# Patient Record
Sex: Female | Born: 1945 | Race: White | Hispanic: No | Marital: Married | Smoking: Former smoker
Health system: Southern US, Community
[De-identification: ages and names within clinical notes are randomized; demographics above are authoritative.]

## PROBLEM LIST (undated history)

## (undated) DIAGNOSIS — R51 Headache: Secondary | ICD-10-CM

## (undated) DIAGNOSIS — R058 Other specified cough: Secondary | ICD-10-CM

## (undated) DIAGNOSIS — G8929 Other chronic pain: Secondary | ICD-10-CM

## (undated) DIAGNOSIS — F0390 Unspecified dementia without behavioral disturbance: Secondary | ICD-10-CM

## (undated) DIAGNOSIS — H9190 Unspecified hearing loss, unspecified ear: Secondary | ICD-10-CM

## (undated) DIAGNOSIS — N183 Chronic kidney disease, stage 3 unspecified: Secondary | ICD-10-CM

## (undated) DIAGNOSIS — D509 Iron deficiency anemia, unspecified: Secondary | ICD-10-CM

## (undated) DIAGNOSIS — Z9289 Personal history of other medical treatment: Secondary | ICD-10-CM

## (undated) DIAGNOSIS — I440 Atrioventricular block, first degree: Secondary | ICD-10-CM

## (undated) DIAGNOSIS — I739 Peripheral vascular disease, unspecified: Secondary | ICD-10-CM

## (undated) DIAGNOSIS — R05 Cough: Secondary | ICD-10-CM

## (undated) DIAGNOSIS — K5732 Diverticulitis of large intestine without perforation or abscess without bleeding: Secondary | ICD-10-CM

## (undated) DIAGNOSIS — E079 Disorder of thyroid, unspecified: Secondary | ICD-10-CM

## (undated) DIAGNOSIS — I10 Essential (primary) hypertension: Secondary | ICD-10-CM

## (undated) DIAGNOSIS — T464X5A Adverse effect of angiotensin-converting-enzyme inhibitors, initial encounter: Secondary | ICD-10-CM

## (undated) DIAGNOSIS — I050 Rheumatic mitral stenosis: Secondary | ICD-10-CM

## (undated) DIAGNOSIS — M1990 Unspecified osteoarthritis, unspecified site: Secondary | ICD-10-CM

## (undated) DIAGNOSIS — R112 Nausea with vomiting, unspecified: Secondary | ICD-10-CM

## (undated) DIAGNOSIS — J449 Chronic obstructive pulmonary disease, unspecified: Secondary | ICD-10-CM

## (undated) DIAGNOSIS — E7800 Pure hypercholesterolemia, unspecified: Secondary | ICD-10-CM

## (undated) DIAGNOSIS — K219 Gastro-esophageal reflux disease without esophagitis: Secondary | ICD-10-CM

## (undated) DIAGNOSIS — I350 Nonrheumatic aortic (valve) stenosis: Secondary | ICD-10-CM

## (undated) DIAGNOSIS — D649 Anemia, unspecified: Secondary | ICD-10-CM

## (undated) DIAGNOSIS — Z789 Other specified health status: Secondary | ICD-10-CM

## (undated) DIAGNOSIS — I2510 Atherosclerotic heart disease of native coronary artery without angina pectoris: Secondary | ICD-10-CM

## (undated) DIAGNOSIS — J45909 Unspecified asthma, uncomplicated: Secondary | ICD-10-CM

## (undated) DIAGNOSIS — Z9889 Other specified postprocedural states: Secondary | ICD-10-CM

## (undated) DIAGNOSIS — E119 Type 2 diabetes mellitus without complications: Secondary | ICD-10-CM

## (undated) DIAGNOSIS — I5032 Chronic diastolic (congestive) heart failure: Secondary | ICD-10-CM

## (undated) DIAGNOSIS — G4733 Obstructive sleep apnea (adult) (pediatric): Secondary | ICD-10-CM

## (undated) DIAGNOSIS — R519 Headache, unspecified: Secondary | ICD-10-CM

## (undated) DIAGNOSIS — I441 Atrioventricular block, second degree: Secondary | ICD-10-CM

## (undated) DIAGNOSIS — I77819 Aortic ectasia, unspecified site: Secondary | ICD-10-CM

## (undated) DIAGNOSIS — F419 Anxiety disorder, unspecified: Secondary | ICD-10-CM

## (undated) DIAGNOSIS — Z952 Presence of prosthetic heart valve: Secondary | ICD-10-CM

## (undated) HISTORY — DX: Gastro-esophageal reflux disease without esophagitis: K21.9

## (undated) HISTORY — DX: Diverticulitis of large intestine without perforation or abscess without bleeding: K57.32

## (undated) HISTORY — DX: Unspecified hearing loss, unspecified ear: H91.90

## (undated) HISTORY — PX: BARTHOLIN GLAND CYST EXCISION: SHX565

## (undated) HISTORY — DX: Atrioventricular block, first degree: I44.0

## (undated) HISTORY — DX: Atrioventricular block, second degree: I44.1

## (undated) HISTORY — PX: ABDOMINAL HYSTERECTOMY: SHX81

## (undated) HISTORY — PX: COLECTOMY: SHX59

## (undated) HISTORY — PX: TUBAL LIGATION: SHX77

## (undated) HISTORY — DX: Other specified cough: R05.8

## (undated) HISTORY — DX: Atherosclerotic heart disease of native coronary artery without angina pectoris: I25.10

## (undated) HISTORY — DX: Headache: R51

## (undated) HISTORY — DX: Chronic diastolic (congestive) heart failure: I50.32

## (undated) HISTORY — DX: Other specified health status: Z78.9

## (undated) HISTORY — DX: Essential (primary) hypertension: I10

## (undated) HISTORY — PX: TONSILLECTOMY: SUR1361

## (undated) HISTORY — PX: CATARACT EXTRACTION W/ INTRAOCULAR LENS  IMPLANT, BILATERAL: SHX1307

## (undated) HISTORY — PX: THYROIDECTOMY: SHX17

## (undated) HISTORY — DX: Other chronic pain: G89.29

## (undated) HISTORY — DX: Chronic obstructive pulmonary disease, unspecified: J44.9

## (undated) HISTORY — DX: Presence of prosthetic heart valve: Z95.2

## (undated) HISTORY — DX: Personal history of other medical treatment: Z92.89

## (undated) HISTORY — DX: Peripheral vascular disease, unspecified: I73.9

## (undated) HISTORY — PX: CARDIAC CATHETERIZATION: SHX172

## (undated) HISTORY — DX: Adverse effect of angiotensin-converting-enzyme inhibitors, initial encounter: T46.4X5A

## (undated) HISTORY — DX: Aortic ectasia, unspecified site: I77.819

## (undated) HISTORY — DX: Pure hypercholesterolemia, unspecified: E78.00

## (undated) HISTORY — PX: ABDOMINAL HYSTERECTOMY W/ PARTIAL VAGINACTOMY: SUR660

## (undated) HISTORY — DX: Cough: R05

## (undated) HISTORY — DX: Headache, unspecified: R51.9

## (undated) HISTORY — PX: BLADDER SUSPENSION: SHX72

---
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
## 1986-06-04 HISTORY — PX: BREAST CYST EXCISION: SHX579

## 1999-06-05 HISTORY — PX: CHOLECYSTECTOMY: SHX55

## 2000-09-11 HISTORY — PX: BREAST BIOPSY: SHX20

## 2001-08-02 LAB — FECAL OCCULT BLOOD, GUAIAC: Fecal Occult Blood: NEGATIVE

## 2004-04-13 ENCOUNTER — Ambulatory Visit: Payer: Self-pay | Admitting: General Surgery

## 2004-04-24 ENCOUNTER — Ambulatory Visit: Payer: Self-pay | Admitting: Internal Medicine

## 2004-04-26 ENCOUNTER — Ambulatory Visit: Payer: Self-pay | Admitting: General Surgery

## 2004-08-21 ENCOUNTER — Ambulatory Visit: Payer: Self-pay | Admitting: Internal Medicine

## 2004-10-23 ENCOUNTER — Ambulatory Visit: Payer: Self-pay | Admitting: Internal Medicine

## 2004-10-31 ENCOUNTER — Ambulatory Visit: Payer: Self-pay

## 2004-11-07 ENCOUNTER — Ambulatory Visit: Payer: Self-pay | Admitting: Internal Medicine

## 2004-11-15 ENCOUNTER — Ambulatory Visit: Payer: Self-pay | Admitting: Internal Medicine

## 2005-02-12 ENCOUNTER — Ambulatory Visit: Payer: Self-pay | Admitting: Internal Medicine

## 2005-03-09 ENCOUNTER — Ambulatory Visit: Payer: Self-pay | Admitting: Internal Medicine

## 2005-04-19 ENCOUNTER — Ambulatory Visit: Payer: Self-pay | Admitting: General Surgery

## 2005-05-03 ENCOUNTER — Ambulatory Visit: Payer: Self-pay | Admitting: Internal Medicine

## 2005-05-24 ENCOUNTER — Ambulatory Visit: Payer: Self-pay | Admitting: Internal Medicine

## 2005-06-28 ENCOUNTER — Ambulatory Visit: Payer: Self-pay | Admitting: General Surgery

## 2005-06-28 LAB — HM COLONOSCOPY: HM Colonoscopy: NORMAL

## 2005-07-25 ENCOUNTER — Ambulatory Visit: Payer: Self-pay | Admitting: Internal Medicine

## 2005-08-23 ENCOUNTER — Ambulatory Visit: Payer: Self-pay | Admitting: Internal Medicine

## 2005-11-22 ENCOUNTER — Ambulatory Visit: Payer: Self-pay | Admitting: Internal Medicine

## 2005-11-27 ENCOUNTER — Ambulatory Visit: Payer: Self-pay | Admitting: Internal Medicine

## 2005-11-27 ENCOUNTER — Ambulatory Visit (HOSPITAL_COMMUNITY): Admission: RE | Admit: 2005-11-27 | Discharge: 2005-11-27 | Payer: Self-pay | Admitting: Internal Medicine

## 2005-11-30 ENCOUNTER — Ambulatory Visit: Payer: Self-pay | Admitting: Internal Medicine

## 2006-01-16 ENCOUNTER — Ambulatory Visit: Payer: Self-pay | Admitting: Internal Medicine

## 2006-03-28 ENCOUNTER — Ambulatory Visit: Payer: Self-pay | Admitting: Internal Medicine

## 2006-05-07 ENCOUNTER — Ambulatory Visit: Payer: Self-pay | Admitting: General Surgery

## 2006-06-27 ENCOUNTER — Ambulatory Visit: Payer: Self-pay | Admitting: Internal Medicine

## 2006-07-19 ENCOUNTER — Ambulatory Visit: Payer: Self-pay | Admitting: Internal Medicine

## 2006-09-09 DIAGNOSIS — F3289 Other specified depressive episodes: Secondary | ICD-10-CM | POA: Insufficient documentation

## 2006-09-09 DIAGNOSIS — Z794 Long term (current) use of insulin: Secondary | ICD-10-CM

## 2006-09-09 DIAGNOSIS — F329 Major depressive disorder, single episode, unspecified: Secondary | ICD-10-CM | POA: Insufficient documentation

## 2006-09-09 DIAGNOSIS — E119 Type 2 diabetes mellitus without complications: Secondary | ICD-10-CM | POA: Insufficient documentation

## 2006-09-09 DIAGNOSIS — K5730 Diverticulosis of large intestine without perforation or abscess without bleeding: Secondary | ICD-10-CM | POA: Insufficient documentation

## 2006-09-09 DIAGNOSIS — J309 Allergic rhinitis, unspecified: Secondary | ICD-10-CM | POA: Insufficient documentation

## 2006-09-30 ENCOUNTER — Telehealth (INDEPENDENT_AMBULATORY_CARE_PROVIDER_SITE_OTHER): Payer: Self-pay | Admitting: *Deleted

## 2006-09-30 ENCOUNTER — Ambulatory Visit: Payer: Self-pay | Admitting: Internal Medicine

## 2006-10-01 ENCOUNTER — Encounter (INDEPENDENT_AMBULATORY_CARE_PROVIDER_SITE_OTHER): Payer: Self-pay | Admitting: *Deleted

## 2006-10-01 LAB — CONVERTED CEMR LAB
CO2: 30 meq/L (ref 19–32)
Chloride: 109 meq/L (ref 96–112)
Cholesterol: 230 mg/dL (ref 0–200)
Creatinine, Ser: 0.7 mg/dL (ref 0.4–1.2)
Glucose, Bld: 108 mg/dL — ABNORMAL HIGH (ref 70–99)
HDL: 27.2 mg/dL — ABNORMAL LOW (ref >39.0)
Phosphorus: 3.4 mg/dL (ref 2.3–4.6)
Sodium: 142 meq/L (ref 135–145)
Triglycerides: 284 mg/dL (ref 0–149)

## 2006-10-02 ENCOUNTER — Encounter: Payer: Self-pay | Admitting: Internal Medicine

## 2006-11-28 ENCOUNTER — Ambulatory Visit: Payer: Self-pay | Admitting: Internal Medicine

## 2007-01-02 ENCOUNTER — Ambulatory Visit: Payer: Self-pay | Admitting: Internal Medicine

## 2007-01-02 LAB — CONVERTED CEMR LAB: Hgb A1c MFr Bld: 6.2 % — ABNORMAL HIGH (ref 4.6–6.0)

## 2007-01-28 ENCOUNTER — Ambulatory Visit: Payer: Self-pay | Admitting: Family Medicine

## 2007-01-29 ENCOUNTER — Telehealth: Payer: Self-pay | Admitting: Family Medicine

## 2007-04-09 ENCOUNTER — Ambulatory Visit: Payer: Self-pay | Admitting: Internal Medicine

## 2007-04-09 LAB — CONVERTED CEMR LAB: Blood Glucose, Fingerstick: 131

## 2007-04-24 ENCOUNTER — Ambulatory Visit: Payer: Self-pay | Admitting: Internal Medicine

## 2007-04-25 LAB — CONVERTED CEMR LAB
Albumin: 3.3 g/dL — ABNORMAL LOW (ref 3.5–5.2)
BUN: 9 mg/dL (ref 6–23)
Calcium: 9.2 mg/dL (ref 8.4–10.5)
GFR calc Af Amer: 130 mL/min
GFR calc non Af Amer: 108 mL/min
Glucose, Bld: 110 mg/dL — ABNORMAL HIGH (ref 70–99)
Hgb A1c MFr Bld: 6.3 % — ABNORMAL HIGH (ref 4.6–6.0)
Potassium: 4.3 meq/L (ref 3.5–5.1)

## 2007-05-08 ENCOUNTER — Ambulatory Visit: Payer: Self-pay | Admitting: General Surgery

## 2007-06-16 ENCOUNTER — Telehealth (INDEPENDENT_AMBULATORY_CARE_PROVIDER_SITE_OTHER): Payer: Self-pay | Admitting: *Deleted

## 2007-07-27 ENCOUNTER — Emergency Department: Payer: Self-pay | Admitting: Emergency Medicine

## 2007-07-31 ENCOUNTER — Telehealth: Payer: Self-pay | Admitting: Internal Medicine

## 2007-07-31 ENCOUNTER — Emergency Department: Payer: Self-pay | Admitting: Emergency Medicine

## 2007-08-05 ENCOUNTER — Inpatient Hospital Stay: Payer: Self-pay | Admitting: Internal Medicine

## 2007-08-05 ENCOUNTER — Other Ambulatory Visit: Payer: Self-pay

## 2007-08-06 ENCOUNTER — Telehealth: Payer: Self-pay | Admitting: Internal Medicine

## 2007-08-08 ENCOUNTER — Encounter: Payer: Self-pay | Admitting: Internal Medicine

## 2007-08-22 ENCOUNTER — Ambulatory Visit: Payer: Self-pay | Admitting: Internal Medicine

## 2007-08-26 LAB — CONVERTED CEMR LAB
Albumin: 3.2 g/dL — ABNORMAL LOW (ref 3.5–5.2)
BUN: 11 mg/dL (ref 6–23)
Basophils Relative: 0.8 % (ref 0.0–1.0)
Calcium: 9.7 mg/dL (ref 8.4–10.5)
Cholesterol: 236 mg/dL (ref 0–200)
Direct LDL: 155.3 mg/dL
GFR calc Af Amer: 109 mL/min
Hemoglobin: 12 g/dL (ref 12.0–15.0)
Lymphocytes Relative: 27.3 % (ref 12.0–46.0)
MCV: 83.4 fL (ref 78.0–100.0)
Monocytes Absolute: 0.6 10*3/uL (ref 0.2–0.7)
Monocytes Relative: 7.7 % (ref 3.0–11.0)
Neutro Abs: 4.9 10*3/uL (ref 1.4–7.7)
Potassium: 4.2 meq/L (ref 3.5–5.1)
TSH: 1.19 microintl units/mL (ref 0.35–5.50)
Total Bilirubin: 0.5 mg/dL (ref 0.3–1.2)
Total Protein: 7.3 g/dL (ref 6.0–8.3)
VLDL: 53 mg/dL — ABNORMAL HIGH (ref 0–40)

## 2007-08-27 ENCOUNTER — Encounter: Payer: Self-pay | Admitting: Internal Medicine

## 2007-09-01 ENCOUNTER — Ambulatory Visit: Payer: Self-pay | Admitting: Internal Medicine

## 2007-09-01 ENCOUNTER — Telehealth: Payer: Self-pay | Admitting: Internal Medicine

## 2007-09-02 ENCOUNTER — Encounter: Payer: Self-pay | Admitting: Internal Medicine

## 2007-10-09 ENCOUNTER — Ambulatory Visit: Payer: Self-pay | Admitting: Internal Medicine

## 2007-10-28 ENCOUNTER — Telehealth: Payer: Self-pay | Admitting: Family Medicine

## 2007-10-28 ENCOUNTER — Ambulatory Visit: Payer: Self-pay | Admitting: Internal Medicine

## 2007-10-28 ENCOUNTER — Observation Stay: Payer: Self-pay | Admitting: Internal Medicine

## 2007-10-29 ENCOUNTER — Encounter: Payer: Self-pay | Admitting: Internal Medicine

## 2007-10-29 ENCOUNTER — Inpatient Hospital Stay (HOSPITAL_COMMUNITY): Admission: AD | Admit: 2007-10-29 | Discharge: 2007-10-30 | Payer: Self-pay | Admitting: Cardiology

## 2007-10-29 ENCOUNTER — Ambulatory Visit: Payer: Self-pay | Admitting: Internal Medicine

## 2007-10-29 HISTORY — PX: CORONARY ANGIOPLASTY: SHX604

## 2007-10-30 ENCOUNTER — Telehealth: Payer: Self-pay | Admitting: Internal Medicine

## 2007-10-30 ENCOUNTER — Encounter: Payer: Self-pay | Admitting: Internal Medicine

## 2007-10-31 ENCOUNTER — Telehealth (INDEPENDENT_AMBULATORY_CARE_PROVIDER_SITE_OTHER): Payer: Self-pay | Admitting: *Deleted

## 2007-11-11 ENCOUNTER — Ambulatory Visit: Payer: Self-pay | Admitting: Internal Medicine

## 2007-11-13 LAB — CONVERTED CEMR LAB
AST: 15 units/L (ref 0–37)
Albumin: 3.2 g/dL — ABNORMAL LOW (ref 3.5–5.2)
Basophils Absolute: 0.1 10*3/uL (ref 0.0–0.1)
Basophils Relative: 0.8 % (ref 0.0–1.0)
CO2: 29 meq/L (ref 19–32)
Calcium: 9.3 mg/dL (ref 8.4–10.5)
Chloride: 103 meq/L (ref 96–112)
Eosinophils Absolute: 0.2 10*3/uL (ref 0.0–0.7)
GFR calc Af Amer: 109 mL/min
GFR calc non Af Amer: 90 mL/min
Hemoglobin: 11.7 g/dL — ABNORMAL LOW (ref 12.0–15.0)
Hgb A1c MFr Bld: 6.2 % — ABNORMAL HIGH (ref 4.6–6.0)
MCHC: 34.3 g/dL (ref 30.0–36.0)
MCV: 83.8 fL (ref 78.0–100.0)
Neutro Abs: 5.2 10*3/uL (ref 1.4–7.7)
RBC: 4.07 M/uL (ref 3.87–5.11)
Sodium: 140 meq/L (ref 135–145)

## 2007-11-19 ENCOUNTER — Ambulatory Visit: Payer: Self-pay | Admitting: Internal Medicine

## 2007-12-02 ENCOUNTER — Telehealth (INDEPENDENT_AMBULATORY_CARE_PROVIDER_SITE_OTHER): Payer: Self-pay | Admitting: *Deleted

## 2007-12-08 ENCOUNTER — Telehealth (INDEPENDENT_AMBULATORY_CARE_PROVIDER_SITE_OTHER): Payer: Self-pay | Admitting: *Deleted

## 2007-12-09 ENCOUNTER — Encounter: Payer: Self-pay | Admitting: Internal Medicine

## 2007-12-09 ENCOUNTER — Ambulatory Visit: Payer: Self-pay | Admitting: Pulmonary Disease

## 2007-12-09 ENCOUNTER — Ambulatory Visit (HOSPITAL_BASED_OUTPATIENT_CLINIC_OR_DEPARTMENT_OTHER): Admission: RE | Admit: 2007-12-09 | Discharge: 2007-12-09 | Payer: Self-pay | Admitting: Pulmonary Disease

## 2007-12-12 ENCOUNTER — Ambulatory Visit: Payer: Self-pay | Admitting: Family Medicine

## 2007-12-16 ENCOUNTER — Telehealth: Payer: Self-pay | Admitting: Family Medicine

## 2007-12-17 ENCOUNTER — Telehealth (INDEPENDENT_AMBULATORY_CARE_PROVIDER_SITE_OTHER): Payer: Self-pay | Admitting: *Deleted

## 2007-12-25 ENCOUNTER — Telehealth (INDEPENDENT_AMBULATORY_CARE_PROVIDER_SITE_OTHER): Payer: Self-pay | Admitting: *Deleted

## 2007-12-25 ENCOUNTER — Ambulatory Visit: Payer: Self-pay | Admitting: Pulmonary Disease

## 2007-12-25 DIAGNOSIS — G4733 Obstructive sleep apnea (adult) (pediatric): Secondary | ICD-10-CM | POA: Insufficient documentation

## 2007-12-26 ENCOUNTER — Telehealth: Payer: Self-pay | Admitting: Pulmonary Disease

## 2007-12-29 ENCOUNTER — Ambulatory Visit: Payer: Self-pay | Admitting: Internal Medicine

## 2007-12-29 ENCOUNTER — Telehealth: Payer: Self-pay | Admitting: Internal Medicine

## 2007-12-30 ENCOUNTER — Inpatient Hospital Stay: Payer: Self-pay | Admitting: Internal Medicine

## 2007-12-30 ENCOUNTER — Ambulatory Visit: Payer: Self-pay | Admitting: Internal Medicine

## 2007-12-30 ENCOUNTER — Inpatient Hospital Stay (HOSPITAL_COMMUNITY): Admission: AD | Admit: 2007-12-30 | Discharge: 2008-01-01 | Payer: Self-pay | Admitting: Cardiology

## 2007-12-30 ENCOUNTER — Ambulatory Visit: Payer: Self-pay | Admitting: Cardiology

## 2008-01-13 ENCOUNTER — Encounter: Payer: Self-pay | Admitting: Pulmonary Disease

## 2008-01-21 ENCOUNTER — Ambulatory Visit: Payer: Self-pay | Admitting: Internal Medicine

## 2008-01-28 ENCOUNTER — Ambulatory Visit: Payer: Self-pay | Admitting: Internal Medicine

## 2008-01-28 LAB — CONVERTED CEMR LAB
AST: 13 units/L (ref 0–37)
Albumin: 4 g/dL (ref 3.5–5.2)
Alkaline Phosphatase: 97 units/L (ref 39–117)
BUN: 15 mg/dL (ref 6–23)
HDL: 36 mg/dL — ABNORMAL LOW (ref >39)
LDL Cholesterol: 102 mg/dL — ABNORMAL HIGH (ref 0–99)
Potassium: 4.6 meq/L (ref 3.5–5.3)
Total Bilirubin: 0.3 mg/dL (ref 0.3–1.2)
Total CHOL/HDL Ratio: 5.3
VLDL: 51 mg/dL — ABNORMAL HIGH (ref 0–40)

## 2008-02-17 ENCOUNTER — Ambulatory Visit: Payer: Self-pay | Admitting: Internal Medicine

## 2008-03-08 ENCOUNTER — Ambulatory Visit: Payer: Self-pay | Admitting: Cardiovascular Disease

## 2008-03-10 ENCOUNTER — Ambulatory Visit: Payer: Self-pay | Admitting: Internal Medicine

## 2008-03-11 ENCOUNTER — Telehealth: Payer: Self-pay | Admitting: Internal Medicine

## 2008-03-11 LAB — CONVERTED CEMR LAB
ALT: 16 units/L (ref 0–35)
BUN: 14 mg/dL (ref 6–23)
CO2: 31 meq/L (ref 19–32)
Chloride: 106 meq/L (ref 96–112)
Cholesterol: 166 mg/dL (ref 0–200)
GFR calc non Af Amer: 90 mL/min
Glucose, Bld: 113 mg/dL — ABNORMAL HIGH (ref 70–99)
Hgb A1c MFr Bld: 6.4 % — ABNORMAL HIGH (ref 4.6–6.0)
Potassium: 4.2 meq/L (ref 3.5–5.1)
Sodium: 143 meq/L (ref 135–145)

## 2008-03-12 ENCOUNTER — Encounter: Payer: Self-pay | Admitting: Internal Medicine

## 2008-03-16 ENCOUNTER — Ambulatory Visit: Payer: Self-pay

## 2008-03-17 ENCOUNTER — Telehealth: Payer: Self-pay | Admitting: Internal Medicine

## 2008-03-22 ENCOUNTER — Ambulatory Visit: Payer: Self-pay | Admitting: Internal Medicine

## 2008-03-22 ENCOUNTER — Emergency Department: Payer: Self-pay | Admitting: Emergency Medicine

## 2008-03-23 ENCOUNTER — Encounter: Payer: Self-pay | Admitting: Internal Medicine

## 2008-03-23 ENCOUNTER — Ambulatory Visit: Payer: Self-pay | Admitting: Emergency Medicine

## 2008-06-09 ENCOUNTER — Telehealth (INDEPENDENT_AMBULATORY_CARE_PROVIDER_SITE_OTHER): Payer: Self-pay | Admitting: *Deleted

## 2008-06-17 ENCOUNTER — Ambulatory Visit: Payer: Self-pay | Admitting: Family Medicine

## 2008-06-23 ENCOUNTER — Ambulatory Visit: Payer: Self-pay | Admitting: Internal Medicine

## 2008-06-23 DIAGNOSIS — R413 Other amnesia: Secondary | ICD-10-CM | POA: Insufficient documentation

## 2008-06-25 ENCOUNTER — Encounter: Payer: Self-pay | Admitting: Internal Medicine

## 2008-06-25 ENCOUNTER — Telehealth: Payer: Self-pay | Admitting: Internal Medicine

## 2008-07-15 ENCOUNTER — Ambulatory Visit: Payer: Self-pay | Admitting: Internal Medicine

## 2008-07-27 ENCOUNTER — Encounter: Payer: Self-pay | Admitting: Internal Medicine

## 2008-07-27 ENCOUNTER — Ambulatory Visit: Payer: Self-pay

## 2008-08-02 LAB — HM DIABETES EYE EXAM: HM Diabetic Eye Exam: NORMAL

## 2008-08-04 ENCOUNTER — Telehealth: Payer: Self-pay | Admitting: Internal Medicine

## 2008-08-06 ENCOUNTER — Telehealth: Payer: Self-pay | Admitting: Internal Medicine

## 2008-08-16 ENCOUNTER — Telehealth: Payer: Self-pay | Admitting: Family Medicine

## 2008-10-26 ENCOUNTER — Ambulatory Visit: Payer: Self-pay | Admitting: Family Medicine

## 2008-10-26 DIAGNOSIS — M722 Plantar fascial fibromatosis: Secondary | ICD-10-CM | POA: Insufficient documentation

## 2008-10-27 ENCOUNTER — Ambulatory Visit: Payer: Self-pay | Admitting: Family Medicine

## 2008-11-10 ENCOUNTER — Telehealth: Payer: Self-pay | Admitting: Internal Medicine

## 2008-11-29 ENCOUNTER — Telehealth: Payer: Self-pay | Admitting: Family Medicine

## 2008-12-10 ENCOUNTER — Telehealth: Payer: Self-pay | Admitting: Internal Medicine

## 2008-12-24 ENCOUNTER — Ambulatory Visit: Payer: Self-pay | Admitting: Internal Medicine

## 2008-12-27 LAB — CONVERTED CEMR LAB
BUN: 12 mg/dL (ref 6–23)
Basophils Absolute: 0 10*3/uL (ref 0.0–0.1)
Bilirubin, Direct: 0 mg/dL (ref 0.0–0.3)
CO2: 30 meq/L (ref 19–32)
Calcium: 9.1 mg/dL (ref 8.4–10.5)
Chloride: 106 meq/L (ref 96–112)
Creatinine,U: 115 mg/dL
HCT: 35.8 % — ABNORMAL LOW (ref 36.0–46.0)
HDL: 28.9 mg/dL — ABNORMAL LOW (ref >39.00)
Hemoglobin: 11.7 g/dL — ABNORMAL LOW (ref 12.0–15.0)
Lymphs Abs: 1.3 10*3/uL (ref 0.7–4.0)
MCV: 78.3 fL (ref 78.0–100.0)
Monocytes Absolute: 0.4 10*3/uL (ref 0.1–1.0)
Monocytes Relative: 7 % (ref 3.0–12.0)
Neutro Abs: 4.5 10*3/uL (ref 1.4–7.7)
Phosphorus: 4.2 mg/dL (ref 2.3–4.6)
Platelets: 175 10*3/uL (ref 150.0–400.0)
Potassium: 4.5 meq/L (ref 3.5–5.1)
RDW: 14.1 % (ref 11.5–14.6)
Sodium: 141 meq/L (ref 135–145)
Total Bilirubin: 0.7 mg/dL (ref 0.3–1.2)
Total CHOL/HDL Ratio: 9
Triglycerides: 223 mg/dL — ABNORMAL HIGH (ref 0.0–149.0)
VLDL: 44.6 mg/dL — ABNORMAL HIGH (ref 0.0–40.0)

## 2009-01-06 ENCOUNTER — Ambulatory Visit: Payer: Self-pay | Admitting: Internal Medicine

## 2009-01-06 DIAGNOSIS — I10 Essential (primary) hypertension: Secondary | ICD-10-CM | POA: Insufficient documentation

## 2009-01-07 ENCOUNTER — Encounter: Payer: Self-pay | Admitting: Internal Medicine

## 2009-01-12 ENCOUNTER — Telehealth: Payer: Self-pay | Admitting: Internal Medicine

## 2009-01-18 ENCOUNTER — Telehealth (INDEPENDENT_AMBULATORY_CARE_PROVIDER_SITE_OTHER): Payer: Self-pay | Admitting: *Deleted

## 2009-01-19 ENCOUNTER — Ambulatory Visit: Payer: Self-pay

## 2009-01-19 ENCOUNTER — Encounter: Payer: Self-pay | Admitting: Internal Medicine

## 2009-01-20 ENCOUNTER — Telehealth (INDEPENDENT_AMBULATORY_CARE_PROVIDER_SITE_OTHER): Payer: Self-pay | Admitting: *Deleted

## 2009-01-20 ENCOUNTER — Telehealth: Payer: Self-pay | Admitting: Internal Medicine

## 2009-01-28 ENCOUNTER — Ambulatory Visit: Payer: Self-pay | Admitting: Pulmonary Disease

## 2009-01-28 DIAGNOSIS — J45909 Unspecified asthma, uncomplicated: Secondary | ICD-10-CM | POA: Insufficient documentation

## 2009-02-14 ENCOUNTER — Telehealth: Payer: Self-pay | Admitting: Internal Medicine

## 2009-02-15 ENCOUNTER — Telehealth: Payer: Self-pay | Admitting: Internal Medicine

## 2009-02-18 ENCOUNTER — Telehealth: Payer: Self-pay | Admitting: Internal Medicine

## 2009-02-22 ENCOUNTER — Encounter: Admission: RE | Admit: 2009-02-22 | Discharge: 2009-02-22 | Payer: Self-pay | Admitting: Family Medicine

## 2009-02-22 ENCOUNTER — Telehealth (INDEPENDENT_AMBULATORY_CARE_PROVIDER_SITE_OTHER): Payer: Self-pay | Admitting: *Deleted

## 2009-02-22 ENCOUNTER — Ambulatory Visit: Payer: Self-pay | Admitting: Family Medicine

## 2009-03-01 ENCOUNTER — Ambulatory Visit: Payer: Self-pay | Admitting: Cardiology

## 2009-03-02 ENCOUNTER — Ambulatory Visit: Payer: Self-pay | Admitting: Family Medicine

## 2009-03-02 DIAGNOSIS — E049 Nontoxic goiter, unspecified: Secondary | ICD-10-CM | POA: Insufficient documentation

## 2009-03-03 ENCOUNTER — Encounter: Admission: RE | Admit: 2009-03-03 | Discharge: 2009-03-03 | Payer: Self-pay | Admitting: Family Medicine

## 2009-03-04 ENCOUNTER — Encounter: Payer: Self-pay | Admitting: Family Medicine

## 2009-03-07 LAB — CONVERTED CEMR LAB: Free T4: 0.6 ng/dL (ref 0.6–1.6)

## 2009-03-11 ENCOUNTER — Encounter: Payer: Self-pay | Admitting: Internal Medicine

## 2009-03-15 ENCOUNTER — Telehealth: Payer: Self-pay | Admitting: Internal Medicine

## 2009-03-21 ENCOUNTER — Encounter: Payer: Self-pay | Admitting: Family Medicine

## 2009-03-21 ENCOUNTER — Encounter (INDEPENDENT_AMBULATORY_CARE_PROVIDER_SITE_OTHER): Payer: Self-pay | Admitting: Interventional Radiology

## 2009-03-21 ENCOUNTER — Ambulatory Visit (HOSPITAL_COMMUNITY): Admission: RE | Admit: 2009-03-21 | Discharge: 2009-03-21 | Payer: Self-pay | Admitting: General Surgery

## 2009-03-28 ENCOUNTER — Ambulatory Visit: Payer: Self-pay | Admitting: Family Medicine

## 2009-03-28 DIAGNOSIS — K1120 Sialoadenitis, unspecified: Secondary | ICD-10-CM

## 2009-04-04 ENCOUNTER — Encounter (INDEPENDENT_AMBULATORY_CARE_PROVIDER_SITE_OTHER): Payer: Self-pay | Admitting: *Deleted

## 2009-04-05 ENCOUNTER — Encounter: Payer: Self-pay | Admitting: Family Medicine

## 2009-04-06 ENCOUNTER — Telehealth: Payer: Self-pay | Admitting: Internal Medicine

## 2009-04-08 ENCOUNTER — Ambulatory Visit: Payer: Self-pay | Admitting: Family Medicine

## 2009-04-08 ENCOUNTER — Encounter: Payer: Self-pay | Admitting: Family Medicine

## 2009-04-19 ENCOUNTER — Telehealth: Payer: Self-pay | Admitting: Family Medicine

## 2009-04-20 ENCOUNTER — Ambulatory Visit: Payer: Self-pay | Admitting: Family Medicine

## 2009-04-20 LAB — CONVERTED CEMR LAB: Hgb A1c MFr Bld: 7.5 % — ABNORMAL HIGH (ref 4.6–6.5)

## 2009-04-30 ENCOUNTER — Encounter: Payer: Self-pay | Admitting: Family Medicine

## 2009-05-06 ENCOUNTER — Telehealth (INDEPENDENT_AMBULATORY_CARE_PROVIDER_SITE_OTHER): Payer: Self-pay | Admitting: Internal Medicine

## 2009-05-07 ENCOUNTER — Ambulatory Visit: Payer: Self-pay | Admitting: Family Medicine

## 2009-05-07 DIAGNOSIS — R3129 Other microscopic hematuria: Secondary | ICD-10-CM

## 2009-05-07 LAB — CONVERTED CEMR LAB
Blood in Urine, dipstick: NEGATIVE
Glucose, Urine, Semiquant: 500
Ketones, urine, test strip: NEGATIVE
Nitrite: NEGATIVE
Urobilinogen, UA: 0.2
pH: 5

## 2009-05-09 ENCOUNTER — Telehealth: Payer: Self-pay | Admitting: Family Medicine

## 2009-05-09 ENCOUNTER — Ambulatory Visit: Payer: Self-pay | Admitting: Family Medicine

## 2009-05-09 LAB — CONVERTED CEMR LAB
Blood Glucose, Fingerstick: 381
Calcium: 9.4 mg/dL (ref 8.4–10.5)
GFR calc non Af Amer: 76.59 mL/min (ref >60)
Potassium: 3.5 meq/L (ref 3.5–5.1)
Sodium: 137 meq/L (ref 135–145)

## 2009-05-17 ENCOUNTER — Telehealth: Payer: Self-pay | Admitting: Family Medicine

## 2009-05-18 ENCOUNTER — Ambulatory Visit: Payer: Self-pay | Admitting: Family Medicine

## 2009-05-18 DIAGNOSIS — R300 Dysuria: Secondary | ICD-10-CM

## 2009-05-18 LAB — CONVERTED CEMR LAB
Bilirubin Urine: NEGATIVE
Glucose, Urine, Semiquant: NEGATIVE
Specific Gravity, Urine: 1.015
pH: 6

## 2009-05-19 ENCOUNTER — Encounter: Payer: Self-pay | Admitting: Family Medicine

## 2009-05-19 LAB — CONVERTED CEMR LAB
Albumin: 3.5 g/dL (ref 3.5–5.2)
Calcium: 8.5 mg/dL (ref 8.4–10.5)
Phosphorus: 2.9 mg/dL (ref 2.3–4.6)
Sodium: 136 meq/L (ref 135–145)

## 2009-05-24 ENCOUNTER — Telehealth: Payer: Self-pay | Admitting: Family Medicine

## 2009-05-31 ENCOUNTER — Telehealth: Payer: Self-pay | Admitting: Family Medicine

## 2009-06-02 ENCOUNTER — Ambulatory Visit: Payer: Self-pay | Admitting: Family Medicine

## 2009-06-02 ENCOUNTER — Telehealth: Payer: Self-pay | Admitting: Family Medicine

## 2009-06-07 ENCOUNTER — Telehealth: Payer: Self-pay | Admitting: Family Medicine

## 2009-06-09 ENCOUNTER — Telehealth: Payer: Self-pay | Admitting: Family Medicine

## 2009-06-10 ENCOUNTER — Telehealth: Payer: Self-pay | Admitting: Family Medicine

## 2009-06-15 ENCOUNTER — Telehealth: Payer: Self-pay | Admitting: Internal Medicine

## 2009-06-22 ENCOUNTER — Telehealth: Payer: Self-pay | Admitting: Family Medicine

## 2009-06-24 ENCOUNTER — Ambulatory Visit: Payer: Self-pay | Admitting: Family Medicine

## 2009-06-24 ENCOUNTER — Encounter: Payer: Self-pay | Admitting: Family Medicine

## 2009-07-04 ENCOUNTER — Encounter (INDEPENDENT_AMBULATORY_CARE_PROVIDER_SITE_OTHER): Payer: Self-pay | Admitting: *Deleted

## 2009-07-13 ENCOUNTER — Telehealth: Payer: Self-pay | Admitting: Family Medicine

## 2009-08-10 ENCOUNTER — Ambulatory Visit: Payer: Self-pay | Admitting: Family Medicine

## 2009-08-10 DIAGNOSIS — E039 Hypothyroidism, unspecified: Secondary | ICD-10-CM

## 2009-08-11 LAB — CONVERTED CEMR LAB
Hgb A1c MFr Bld: 8.9 % — ABNORMAL HIGH (ref 4.6–6.5)
TSH: 0.81 microintl units/mL (ref 0.35–5.50)

## 2009-08-17 ENCOUNTER — Telehealth: Payer: Self-pay | Admitting: Family Medicine

## 2009-08-24 ENCOUNTER — Telehealth: Payer: Self-pay | Admitting: Family Medicine

## 2009-08-31 ENCOUNTER — Telehealth: Payer: Self-pay | Admitting: Family Medicine

## 2009-09-06 ENCOUNTER — Telehealth (INDEPENDENT_AMBULATORY_CARE_PROVIDER_SITE_OTHER): Payer: Self-pay | Admitting: *Deleted

## 2009-09-26 ENCOUNTER — Encounter: Payer: Self-pay | Admitting: Family Medicine

## 2009-09-29 ENCOUNTER — Ambulatory Visit: Payer: Self-pay | Admitting: Family Medicine

## 2009-09-29 DIAGNOSIS — M19049 Primary osteoarthritis, unspecified hand: Secondary | ICD-10-CM | POA: Insufficient documentation

## 2009-10-07 ENCOUNTER — Encounter: Payer: Self-pay | Admitting: Internal Medicine

## 2009-11-02 ENCOUNTER — Encounter: Payer: Self-pay | Admitting: Family Medicine

## 2009-11-09 ENCOUNTER — Telehealth: Payer: Self-pay | Admitting: Family Medicine

## 2009-11-11 ENCOUNTER — Ambulatory Visit: Payer: Self-pay | Admitting: Family Medicine

## 2009-11-11 LAB — CONVERTED CEMR LAB
Bilirubin Urine: NEGATIVE
Protein, U semiquant: NEGATIVE
Urobilinogen, UA: 0.2

## 2009-11-14 ENCOUNTER — Telehealth: Payer: Self-pay | Admitting: Internal Medicine

## 2009-11-14 ENCOUNTER — Ambulatory Visit: Payer: Self-pay | Admitting: Cardiovascular Disease

## 2009-12-27 ENCOUNTER — Telehealth: Payer: Self-pay | Admitting: Cardiovascular Disease

## 2010-01-02 ENCOUNTER — Ambulatory Visit: Payer: Medicare Other | Admitting: Otolaryngology

## 2010-01-18 ENCOUNTER — Encounter: Payer: Self-pay | Admitting: Family Medicine

## 2010-01-18 ENCOUNTER — Encounter: Payer: Self-pay | Admitting: Internal Medicine

## 2010-01-19 ENCOUNTER — Inpatient Hospital Stay: Payer: Medicare Other | Admitting: Otolaryngology

## 2010-01-20 LAB — PATHOLOGY REPORT

## 2010-01-21 ENCOUNTER — Encounter: Payer: Self-pay | Admitting: Family Medicine

## 2010-01-24 ENCOUNTER — Encounter: Payer: Self-pay | Admitting: Cardiovascular Disease

## 2010-01-24 ENCOUNTER — Encounter: Payer: Self-pay | Admitting: Family Medicine

## 2010-01-24 ENCOUNTER — Emergency Department: Payer: Medicare Other | Admitting: Emergency Medicine

## 2010-01-25 ENCOUNTER — Ambulatory Visit: Payer: Self-pay | Admitting: Family Medicine

## 2010-01-25 DIAGNOSIS — J449 Chronic obstructive pulmonary disease, unspecified: Secondary | ICD-10-CM

## 2010-01-25 DIAGNOSIS — I319 Disease of pericardium, unspecified: Secondary | ICD-10-CM | POA: Insufficient documentation

## 2010-01-26 ENCOUNTER — Telehealth: Payer: Self-pay | Admitting: Internal Medicine

## 2010-01-30 ENCOUNTER — Encounter (INDEPENDENT_AMBULATORY_CARE_PROVIDER_SITE_OTHER): Payer: Self-pay | Admitting: *Deleted

## 2010-02-02 ENCOUNTER — Ambulatory Visit (HOSPITAL_COMMUNITY): Admission: RE | Admit: 2010-02-02 | Discharge: 2010-02-02 | Payer: Self-pay | Admitting: Family Medicine

## 2010-02-02 ENCOUNTER — Encounter: Payer: Self-pay | Admitting: Family Medicine

## 2010-02-02 ENCOUNTER — Ambulatory Visit: Payer: Self-pay

## 2010-02-02 ENCOUNTER — Ambulatory Visit: Payer: Self-pay | Admitting: Cardiology

## 2010-02-02 ENCOUNTER — Encounter: Payer: Self-pay | Admitting: Internal Medicine

## 2010-02-03 ENCOUNTER — Ambulatory Visit: Payer: Self-pay | Admitting: Internal Medicine

## 2010-02-03 DIAGNOSIS — R5383 Other fatigue: Secondary | ICD-10-CM

## 2010-02-03 DIAGNOSIS — R5381 Other malaise: Secondary | ICD-10-CM

## 2010-02-07 ENCOUNTER — Telehealth: Payer: Self-pay | Admitting: Internal Medicine

## 2010-02-08 ENCOUNTER — Telehealth: Payer: Self-pay | Admitting: Internal Medicine

## 2010-02-09 ENCOUNTER — Telehealth: Payer: Self-pay | Admitting: Internal Medicine

## 2010-02-14 ENCOUNTER — Telehealth: Payer: Self-pay | Admitting: Family Medicine

## 2010-02-22 ENCOUNTER — Encounter: Payer: Self-pay | Admitting: Family Medicine

## 2010-02-24 ENCOUNTER — Encounter: Payer: Self-pay | Admitting: Family Medicine

## 2010-02-24 ENCOUNTER — Ambulatory Visit: Payer: Medicare Other | Admitting: Family Medicine

## 2010-03-03 ENCOUNTER — Ambulatory Visit: Payer: Self-pay | Admitting: Family Medicine

## 2010-03-16 ENCOUNTER — Ambulatory Visit: Payer: Self-pay | Admitting: Family Medicine

## 2010-03-22 LAB — CONVERTED CEMR LAB: TSH: 2.66 microintl units/mL (ref 0.35–5.50)

## 2010-04-14 ENCOUNTER — Telehealth: Payer: Self-pay | Admitting: Family Medicine

## 2010-04-17 ENCOUNTER — Telehealth: Payer: Self-pay | Admitting: Family Medicine

## 2010-04-17 ENCOUNTER — Ambulatory Visit: Payer: Self-pay | Admitting: Internal Medicine

## 2010-04-17 DIAGNOSIS — R05 Cough: Secondary | ICD-10-CM

## 2010-04-17 DIAGNOSIS — R252 Cramp and spasm: Secondary | ICD-10-CM

## 2010-04-17 LAB — CONVERTED CEMR LAB
BUN: 11 mg/dL (ref 6–23)
Chloride: 99 meq/L (ref 96–112)
Magnesium: 1.8 mg/dL (ref 1.5–2.5)
Potassium: 4.1 meq/L (ref 3.5–5.1)

## 2010-04-25 ENCOUNTER — Ambulatory Visit: Payer: Self-pay | Admitting: Family Medicine

## 2010-05-31 ENCOUNTER — Ambulatory Visit: Payer: Self-pay | Admitting: Family Medicine

## 2010-06-13 ENCOUNTER — Other Ambulatory Visit: Payer: Self-pay | Admitting: Family Medicine

## 2010-06-13 ENCOUNTER — Ambulatory Visit
Admission: RE | Admit: 2010-06-13 | Discharge: 2010-06-13 | Payer: Self-pay | Source: Home / Self Care | Attending: Family Medicine | Admitting: Family Medicine

## 2010-06-13 DIAGNOSIS — J019 Acute sinusitis, unspecified: Secondary | ICD-10-CM | POA: Insufficient documentation

## 2010-06-13 LAB — IGA: IgA: 202 mg/dL (ref 68–378)

## 2010-06-13 LAB — TSH: TSH: 4 u[IU]/mL (ref 0.35–5.50)

## 2010-06-14 ENCOUNTER — Telehealth (INDEPENDENT_AMBULATORY_CARE_PROVIDER_SITE_OTHER): Payer: Self-pay | Admitting: *Deleted

## 2010-06-15 ENCOUNTER — Telehealth: Payer: Self-pay | Admitting: Family Medicine

## 2010-06-16 ENCOUNTER — Other Ambulatory Visit: Payer: Self-pay | Admitting: Family Medicine

## 2010-06-16 ENCOUNTER — Ambulatory Visit
Admission: RE | Admit: 2010-06-16 | Discharge: 2010-06-16 | Payer: Self-pay | Source: Home / Self Care | Attending: Family Medicine | Admitting: Family Medicine

## 2010-06-16 LAB — HEMOGLOBIN A1C: Hgb A1c MFr Bld: 7.7 % — ABNORMAL HIGH (ref 4.6–6.5)

## 2010-06-19 ENCOUNTER — Telehealth: Payer: Self-pay | Admitting: Family Medicine

## 2010-06-20 ENCOUNTER — Ambulatory Visit: Admit: 2010-06-20 | Payer: Self-pay | Admitting: Family Medicine

## 2010-06-30 ENCOUNTER — Telehealth: Payer: Self-pay | Admitting: Family Medicine

## 2010-06-30 ENCOUNTER — Ambulatory Visit
Admission: RE | Admit: 2010-06-30 | Discharge: 2010-06-30 | Payer: Self-pay | Source: Home / Self Care | Attending: Family Medicine | Admitting: Family Medicine

## 2010-07-02 LAB — CONVERTED CEMR LAB
ALT: 15 units/L (ref 0–35)
BUN: 12 mg/dL (ref 6–23)
Bilirubin, Direct: 0.1 mg/dL (ref 0.0–0.3)
Chloride: 108 meq/L (ref 96–112)
Eosinophils Absolute: 0.1 10*3/uL (ref 0.0–0.7)
Eosinophils Relative: 1.9 % (ref 0.0–5.0)
GFR calc Af Amer: 130 mL/min
HCT: 35.5 % — ABNORMAL LOW (ref 36.0–46.0)
HDL: 31.1 mg/dL — ABNORMAL LOW (ref >39.0)
Hemoglobin: 11.8 g/dL — ABNORMAL LOW (ref 12.0–15.0)
Hgb A1c MFr Bld: 6.3 % — ABNORMAL HIGH (ref 4.6–6.0)
MCV: 82.4 fL (ref 78.0–100.0)
Monocytes Absolute: 0.6 10*3/uL (ref 0.1–1.0)
Neutro Abs: 4.6 10*3/uL (ref 1.4–7.7)
Phosphorus: 4 mg/dL (ref 2.3–4.6)
Platelets: 167 10*3/uL (ref 150–400)
Potassium: 4.3 meq/L (ref 3.5–5.1)
RDW: 13.2 % (ref 11.5–14.6)
Sodium: 141 meq/L (ref 135–145)
TSH: 2.38 microintl units/mL (ref 0.35–5.50)
Total Bilirubin: 0.7 mg/dL (ref 0.3–1.2)
Total Protein: 6.4 g/dL (ref 6.0–8.3)
Triglycerides: 134 mg/dL (ref 0–149)
Vitamin B-12: 366 pg/mL (ref 211–911)

## 2010-07-04 ENCOUNTER — Encounter: Payer: Self-pay | Admitting: Family Medicine

## 2010-07-04 ENCOUNTER — Ambulatory Visit: Payer: Medicare Other | Admitting: Family Medicine

## 2010-07-05 ENCOUNTER — Ambulatory Visit: Payer: Medicare Other | Admitting: Family Medicine

## 2010-07-05 ENCOUNTER — Encounter: Payer: Self-pay | Admitting: Family Medicine

## 2010-07-05 DIAGNOSIS — R928 Other abnormal and inconclusive findings on diagnostic imaging of breast: Secondary | ICD-10-CM | POA: Insufficient documentation

## 2010-07-05 NOTE — Progress Notes (Signed)
Summary: refill request for trazadone  Phone Note Refill Request Message from:  Fax from Pharmacy  Refills Requested: Medication #1:  TRAZODONE HCL 100 MG TABS 2-3 tabs at bedtime for insomnia   Last Refilled: 10/09/2009 Faxed request from [place], [phone].  Initial call taken by: Lowella Petties CMA,  November 09, 2009 9:08 AM    Prescriptions: TRAZODONE HCL 100 MG TABS (TRAZODONE HCL) 2-3 tabs at bedtime for insomnia  #90 x 3   Entered and Authorized by:   Ruthe Mannan MD   Signed by:   Ruthe Mannan MD on 11/09/2009   Method used:   Electronically to        [facility] [address] (retail)       [address]       [place], [place]  [address]       Ph: [phone]       Fax: [phone]   RxID:   [id number]

## 2010-07-05 NOTE — Assessment & Plan Note (Signed)
Summary: F/U,CHECK SUGAR/CLE   Vital Signs:  Patient profile:   65 year old female Height:      66 inches Weight:      180.38 pounds BMI:     29.22 Temp:     97.7 degrees F oral Pulse rate:   88 / minute Pulse rhythm:   regular BP sitting:   120 / 70  (left arm) Cuff size:   large  Vitals Entered By: Delilah Shan CMA Duncan Dull) (August 10, 2009 2:45 PM) CC: Follow-up visit.  check sugar   History of Present Illness: Here for f/u DM  DM- hg a1c 7.5 on 04/20/09.  CBGs have been much better, 120- 280s.    Had been on Gyburide, Metformin, Actos, Byetta, most recently Januvia.  Alll of which she cannot tolerate.  Currently taking Lantus 120 units at bedtime.  Still has dry mouth but better.  Denies any symptoms of hypoglycemia.      Current Medications (verified): 1)  Bufferin 325 Mg Tabs (Aspirin Buf(Cacarb-Mgcarb-Mgo)) .Marland Kitchen.. 1 By Mouth Daily 2)  Plavix 75 Mg  Tabs (Clopidogrel Bisulfate) .... Take 1 Tablet By Mouth Once A Day 3)  Nitrolingual 0.4 Mg/spray Tl Soln (Nitroglycerin) .... Use As Needed For Chest Pain. 4)  Accu-Chek Aviva  Strp (Glucose Blood) .... Check Blood Sugar Twice A Day 5)  Fish Oil   Oil (Fish Oil) .... 1000mg  Two Times A Day 6)  Fluoxetine Hcl 40 Mg  Caps (Fluoxetine Hcl) .Marland Kitchen.. 1 By Mouth Once Daily 7)  Trazodone Hcl 100 Mg Tabs (Trazodone Hcl) .... 2-3 Tabs At Bedtime For Insomnia 8)  Lansoprazole 30 Mg Cpdr (Lansoprazole) .... Take 1 Tablet By Mouth Once Daily 9)  Caltrate 600+d   Tabs (Calcium Carbonate-Vitamin D Tabs) .Marland Kitchen.. 1 Daily 10)  Claritin-D 24 Hour 10-240 Mg Xr24h-Tab (Loratadine-Pseudoephedrine) .... Take 1 Tablet By Mouth Once A Day 11)  Synthroid 88 Mcg Tabs (Levothyroxine Sodium) .... Take One By Mouth Daily 12)  Hydrochlorothiazide 25 Mg Tabs (Hydrochlorothiazide) .... Take 1 Tablet By Mouth Once A Day As Needed 13)  Lantus Solostar 100 Unit/ml  Soln (Insulin Glargine) .Marland Kitchen.. 120 Units At Bedtime  Allergies: 1)  ! Codeine Sulfate (Codeine  Sulfate) 2)  ! Percocet 3)  ! Wellbutrin 4)  ! Demerol (Meperidine Hcl) 5)  ! Neurontin 6)  ! Zyprexa 7)  ! Mirtazapine 8)  ! Darvocet-N 100 9)  ! Lipitor 10)  ! Morphine Sulfate Cr (Morphine Sulfate) 11)  ! Crestor (Rosuvastatin Calcium) 12)  ! Lisinopril (Lisinopril) 13)  ! Nasonex (Mometasone Furoate) 14)  ! Zolpidem Tartrate (Zolpidem Tartrate) 15)  * Meperidine & Related Group 16)  Riomet (Metformin Hcl) 17)  Effexor  Review of Systems      See HPI General:  Denies chills and fever. CV:  Denies chest pain or discomfort. Endo:  Denies cold intolerance, excessive hunger, excessive thirst, excessive urination, heat intolerance, polyuria, and weight change.  Physical Exam  General:  alert and normal appearance, obese Mouth:  MM dry  Extremities:   trace  left pedal edema and trace right pedal edema.   mild pitting. Psych:  normally interactive, good eye contact, not anxious appearing, and dysphoric affect.     Impression & Recommendations:  Problem # 1:  DIABETES MELLITUS, TYPE II (ICD-250.00) Assessment Unchanged Recheck a1c today.  continue Lantus at current dose.   Her updated medication list for this problem includes:    Bufferin 325 Mg Tabs (Aspirin buf(cacarb-mgcarb-mgo)) .Marland Kitchen... 1 by mouth  daily    Lantus Solostar 100 Unit/ml Soln (Insulin glargine) .Marland Kitchen... 120 units at bedtime  Orders: Venipuncture (11914) TLB-A1C / Hgb A1C (Glycohemoglobin) (83036-A1C)  Problem # 2:  UNSPECIFIED HYPOTHYROIDISM (ICD-244.9) Assessment: Unchanged Recheck TSH.  Has been on 0.88 micrograms for several months.  Currently asymptomatic. Her updated medication list for this problem includes:    Synthroid 88 Mcg Tabs (Levothyroxine sodium) .Marland Kitchen... Take one by mouth daily  Orders: Venipuncture (78295) TLB-TSH (Thyroid Stimulating Hormone) (84443-TSH) TLB-T3, Free (Triiodothyronine) (84481-T3FREE) TLB-T4 (Thyrox), Free [phone])  Complete Medication List: 1)  Bufferin 325 Mg Tabs  (Aspirin buf(cacarb-mgcarb-mgo)) .Marland Kitchen.. 1 by mouth daily 2)  Plavix 75 Mg Tabs (Clopidogrel bisulfate) .... Take 1 tablet by mouth once a day 3)  Nitrolingual 0.4 Mg/spray Tl Soln (Nitroglycerin) .... Use as needed for chest pain. 4)  Accu-chek Aviva Strp (Glucose blood) .... Check blood sugar twice a day 5)  Fish Oil Oil (Fish oil) .... 1000mg  two times a day 6)  Fluoxetine Hcl 40 Mg Caps (Fluoxetine hcl) .Marland Kitchen.. 1 by mouth once daily 7)  Trazodone Hcl 100 Mg Tabs (Trazodone hcl) .... 2-3 tabs at bedtime for insomnia 8)  Lansoprazole 30 Mg Cpdr (Lansoprazole) .... Take 1 tablet by mouth once daily 9)  Caltrate 600+d Tabs (Calcium carbonate-vitamin d tabs) .Marland Kitchen.. 1 daily 10)  Claritin-d 24 Hour 10-240 Mg Xr24h-tab (Loratadine-pseudoephedrine) .... Take 1 tablet by mouth once a day 11)  Synthroid 88 Mcg Tabs (Levothyroxine sodium) .... Take one by mouth daily 12)  Hydrochlorothiazide 25 Mg Tabs (Hydrochlorothiazide) .... Take 1 tablet by mouth once a day as needed 13)  Lantus Solostar 100 Unit/ml Soln (Insulin glargine) .Marland Kitchen.. 120 units at bedtime Prescriptions: LANSOPRAZOLE 30 MG CPDR (LANSOPRAZOLE) take 1 tablet by mouth once daily  #90 x 3   Entered and Authorized by:   Ruthe Mannan MD   Signed by:   Ruthe Mannan MD on 08/10/2009   Method used:   Print then Give to Patient   RxID:   [id number] LANTUS SOLOSTAR 100 UNIT/ML  SOLN (INSULIN GLARGINE) 120 units at bedtime  #90 x 3   Entered and Authorized by:   Ruthe Mannan MD   Signed by:   Ruthe Mannan MD on 08/10/2009   Method used:   Print then Give to Patient   RxID:   [id number]   Current Allergies (reviewed today): ! CODEINE SULFATE (CODEINE SULFATE) ! PERCOCET ! WELLBUTRIN ! DEMEROL (MEPERIDINE HCL) ! NEURONTIN ! ZYPREXA ! MIRTAZAPINE ! DARVOCET-N 100 ! LIPITOR ! MORPHINE SULFATE CR (MORPHINE SULFATE) ! CRESTOR (ROSUVASTATIN CALCIUM) ! LISINOPRIL (LISINOPRIL) ! NASONEX (MOMETASONE FUROATE) ! ZOLPIDEM TARTRATE (ZOLPIDEM  TARTRATE) * MEPERIDINE & RELATED GROUP RIOMET (METFORMIN HCL) EFFEXOR

## 2010-07-05 NOTE — Progress Notes (Signed)
Summary: wants referral to endocrinologist  Phone Note Call from Patient Call back at Home Phone [phone]   Caller: Patient Call For: Ruthe Mannan MD Summary of Call: Pt reports that her blood sugar readings are still high.  She wants to see Dr. Patrecia Pace for her diabetic care.  She will need referral even though she sees him for her thyroid, he will be wanting pt's records. Initial call taken by: Lowella Petties CMA,  September 06, 2009 9:34 AM  Follow-up for Phone Call        I'm definitely ok with her seeing endocrinologist.  Additional Follow-up for Phone Call Additional follow up Details #1::        Office notes and labs faxed to Dr. Patrecia Pace.Marland KitchenMarland KitchenCalled pt to inform.Daine Gip  September 09, 2009 10:54 AM  Additional Follow-up by: Daine Gip,  September 09, 2009 10:54 AM

## 2010-07-05 NOTE — Progress Notes (Signed)
Summary: RX NTG spray  Medications Added ACTOS 30 MG TABS (PIOGLITAZONE HCL) 1/2 tab daily ACTOS 30 MG TABS (PIOGLITAZONE HCL) Take 1 tablet by mouth once a day ACTOS 30 MG TABS (PIOGLITAZONE HCL) Take 1 tablet by mouth once a day SIMVASTATIN 40 MG TABS (SIMVASTATIN) 1 daily SIMVASTATIN 40 MG TABS (SIMVASTATIN) 1 daily FLUOXETINE HCL 20 MG TABS (FLUOXETINE HCL) Take 3 tablet by mouth once a day FLUOXETINE HCL 20 MG TABS (FLUOXETINE HCL) Take 3 tablet by mouth once a day EFFEXOR XR 150 MG CP24 (VENLAFAXINE HCL) Take 1 capsule by mouth once a day EFFEXOR XR 150 MG CP24 (VENLAFAXINE HCL) Take 1 capsule by mouth once a day TRAZODONE HCL 150 MG TABS (TRAZODONE HCL) Take 1 tablet by mouth at bedtime TRAZODONE HCL 150 MG TABS (TRAZODONE HCL) Take 1 tablet by mouth at bedtime NEXIUM 40 MG  CPDR (ESOMEPRAZOLE MAGNESIUM) Take 1 tablet by mouth once a day NEXIUM 40 MG  CPDR (ESOMEPRAZOLE MAGNESIUM) Take 1 tablet by mouth once a day GLUCOMETER DEX TEST SENSORS   STRP (GLUCOSE BLOOD) One touch ultra strips--test daily or as directed GLUCOMETER DEX TEST SENSORS   STRP (GLUCOSE BLOOD) One touch ultra strips--test daily or as directed COMFORT LANCETS   MISC (LANCETS) use daily as directed COMFORT LANCETS   MISC (LANCETS) use daily as directed NEXIUM 40 MG  CPDR (ESOMEPRAZOLE MAGNESIUM) 1 daily NEXIUM 40 MG  CPDR (ESOMEPRAZOLE MAGNESIUM) 1 daily FISH OIL   CAPS (OMEGA-3 FATTY ACIDS CAPS) 1 daily FISH OIL   CAPS (OMEGA-3 FATTY ACIDS CAPS) 1 daily BUFFERIN 325 MG TABS (ASPIRIN BUF(CACARB-MGCARB-MGO)) 1 by mouth daily TRAMADOL HCL 50 MG  TABS (TRAMADOL HCL) 1 three times a day as needed for pain TRAMADOL HCL 50 MG  TABS (TRAMADOL HCL) 1 three times a day as needed for pain PREDNISONE 20 MG  TABS (PREDNISONE) 3 tabs by mouth daily x 3 days, then 2 tabs by mouth daily x 2 days then 1 tab by mouth daily x 2 days PREDNISONE 20 MG  TABS (PREDNISONE) 3 tabs by mouth daily x 3 days, then 2 tabs by mouth daily x 2  days then 1 tab by mouth daily x 2 days DARVOCET A500 100-500 MG  TABS (PROPOXYPHENE N-APAP) 1 tab by mouth q 6 hours as needed breakthruough pain DARVOCET A500 100-500 MG  TABS (PROPOXYPHENE N-APAP) 1 tab by mouth q 6 hours as needed breakthruough pain AMOXICILLIN 500 MG  CAPS (AMOXICILLIN) Take 2 tabs twice a day x 10 days. AMOXICILLIN 500 MG  CAPS (AMOXICILLIN) Take 2 tabs twice a day x 10 days. ROBITUSSIN DM SUGAR FREE 100-10 MG/5ML  SYRP (DEXTROMETHORPHAN-GUAIFENESIN) Take 400 mg by mouth every 6 hours while taking the antibiotic. Then change to as needed. ROBITUSSIN DM SUGAR FREE 100-10 MG/5ML  SYRP (DEXTROMETHORPHAN-GUAIFENESIN) Take 400 mg by mouth every 6 hours while taking the antibiotic. Then change to as needed. PLAVIX 75 MG  TABS (CLOPIDOGREL BISULFATE) Take 1 tablet by mouth once a day FLONASE 50 MCG/ACT  SUSP (FLUTICASONE PROPIONATE) Two sprays in each nostril daily FLONASE 50 MCG/ACT  SUSP (FLUTICASONE PROPIONATE) Two sprays in each nostril daily NITROLINGUAL 0.4 MG/SPRAY TL SOLN (NITROGLYCERIN) Use as needed for chest pain. FLUOXETINE HCL 40 MG  CAPS (FLUOXETINE HCL) 1 daily ZOLPIDEM TARTRATE 5 MG  TABS (ZOLPIDEM TARTRATE) 1-2 at bedtime as needed to help sleep ZOLPIDEM TARTRATE 5 MG  TABS (ZOLPIDEM TARTRATE) 1-2 at bedtime as needed to help sleep TRAZODONE HCL 150 MG  TABS (  TRAZODONE HCL) 1 at bedtime. Medically necessary to be filled with [employer] product TRAZODONE HCL 150 MG  TABS (TRAZODONE HCL) 1 at bedtime. Medically necessary to be filled with [employer] product GLIMEPIRIDE 4 MG TABS (GLIMEPIRIDE) 1/2  tab before breakfast daily for diabetes GLIMEPIRIDE 4 MG TABS (GLIMEPIRIDE) 1 tab before breakfast daily for diabetes GLIMEPIRIDE 4 MG TABS (GLIMEPIRIDE) 1/2  tab before breakfast daily for diabetes CRESTOR 10 MG  TABS (ROSUVASTATIN CALCIUM) Take 1 tablet by mouth once a day CRESTOR 10 MG  TABS (ROSUVASTATIN CALCIUM) Take 1 tablet by mouth once a day * ACCU-CHEK AVIVA TEST STRIPS  Check blood sugar twice a day and as needed for sx's. ACCU-CHEK AVIVA  STRP (GLUCOSE BLOOD) check blood sugar twice a day ACETAMINOPHEN 325 MG  TABS (ACETAMINOPHEN) Take 1 tablet by mouth once a day ACETAMINOPHEN 325 MG  TABS (ACETAMINOPHEN) Take 1 tablet by mouth once a day [facility] IBUPROFEN 200 MG  CAPS (IBUPROFEN) Take as needed [facility] IBUPROFEN 200 MG  CAPS (IBUPROFEN) Take as needed VALTREX 1 GM  TABS (VALACYCLOVIR HCL) 1 tab by mouth three times a day x 7 days VALTREX 1 GM  TABS (VALACYCLOVIR HCL) 1 tab by mouth three times a day x 7 days GABAPENTIN 300 MG  CAPS (GABAPENTIN) 2 in morning and 4 at night GABAPENTIN 300 MG  CAPS (GABAPENTIN) 1 tab by mouth two times a day GABAPENTIN 300 MG  CAPS (GABAPENTIN) 2 in morning and 4 at night TRAMADOL HCL 50 MG  TABS (TRAMADOL HCL) 1 tab by mouth q 6 hours as needed pain TRAMADOL HCL 50 MG  TABS (TRAMADOL HCL) 1 tab by mouth q 6 hours as needed pain MORPHINE SULFATE 15 MG  TABS (MORPHINE SULFATE) 1-2 three times a day as needed for severe pain MORPHINE SULFATE 15 MG  TABS (MORPHINE SULFATE) 1-2 three times a day as needed for severe pain CRESTOR 20 MG TABS (ROSUVASTATIN CALCIUM) Take one by mouth daily CRESTOR 20 MG TABS (ROSUVASTATIN CALCIUM) Take one by mouth daily LISINOPRIL 10 MG TABS (LISINOPRIL) 1 daily LISINOPRIL 10 MG TABS (LISINOPRIL) 1 daily VALTREX 1 GM TABS (VALACYCLOVIR HCL) 1 three times a day for 7days for rash [BMN] VALTREX 1 GM TABS (VALACYCLOVIR HCL) 1 three times a day for 7days for rash [BMN] FISH OIL   OIL (FISH OIL) 1000mg  two times a day VALTREX 1 GM TABS (VALACYCLOVIR HCL) 1 tablet 3 times a day for 7 days VALTREX 1 GM TABS (VALACYCLOVIR HCL) 1 tablet 3 times a day for 7 days ACYCLOVIR 800 MG TABS (ACYCLOVIR) take 1 tablet by mouth 4 times a day for 7 days ACYCLOVIR 800 MG TABS (ACYCLOVIR) take 1 tablet by mouth 4 times a day for 7 days FLUOXETINE HCL 40 MG  CAPS (FLUOXETINE HCL) 1 by mouth once daily TRAZODONE HCL 100 MG TABS  (TRAZODONE HCL) 2-3 tabs at bedtime for insomnia ZOLPIDEM TARTRATE 5 MG TABS (ZOLPIDEM TARTRATE) one by mouth at bedtime as needed ZOLPIDEM TARTRATE 5 MG TABS (ZOLPIDEM TARTRATE) one by mouth at bedtime as needed LISINOPRIL 10 MG TABS (LISINOPRIL) Take one tablet by mouth daily LISINOPRIL 10 MG TABS (LISINOPRIL) Take one tablet by mouth daily DIOVAN 80 MG TABS (VALSARTAN) Take one tablet by mouth daily DIOVAN 80 MG TABS (VALSARTAN) Take one tablet by mouth daily LANSOPRAZOLE 30 MG CPDR (LANSOPRAZOLE) take 1 tablet by mouth once daily NASONEX 50 MCG/ACT SUSP (MOMETASONE FUROATE) one spray once daily NASONEX 50 MCG/ACT SUSP (MOMETASONE FUROATE) one spray once daily  CALTRATE 600+D   TABS (CALCIUM CARBONATE-VITAMIN D TABS) 1 daily GLIMEPIRIDE 1 MG TABS (GLIMEPIRIDE) take 1 by mouth once daily GLIMEPIRIDE 1 MG TABS (GLIMEPIRIDE) take 1 by mouth once daily ACTOS 15 MG TABS (PIOGLITAZONE HCL) take one by mouth daily ACTOS 15 MG TABS (PIOGLITAZONE HCL) take one by mouth daily CLARITIN-D 24 HOUR 10-240 MG XR24H-TAB (LORATADINE-PSEUDOEPHEDRINE) Take 1 tablet by mouth once a day METFORMIN HCL 500 MG TABS (METFORMIN HCL) 1 tab by mouth bid METFORMIN HCL 500 MG TABS (METFORMIN HCL) 1 tab by mouth bid GLYBURIDE 5 MG TABS (GLYBURIDE) 1 tab by mouth daily. GLYBURIDE 5 MG TABS (GLYBURIDE) 2  tabs by mouth daily. GLYBURIDE 5 MG TABS (GLYBURIDE) 2  tabs by mouth daily. SYNTHROID 88 MCG TABS (LEVOTHYROXINE SODIUM) take one by mouth daily HYDROCHLOROTHIAZIDE 25 MG TABS (HYDROCHLOROTHIAZIDE) 1 tab by mouth daily HYDROCHLOROTHIAZIDE 25 MG TABS (HYDROCHLOROTHIAZIDE) 1 tab by mouth daily HYDROCHLOROTHIAZIDE 25 MG TABS (HYDROCHLOROTHIAZIDE) Take 1 tablet by mouth once a day HYDROCHLOROTHIAZIDE 25 MG TABS (HYDROCHLOROTHIAZIDE) Take 1 tablet by mouth once a day as needed BYETTA 5 MCG PEN 5 MCG/0.02ML SOLN (EXENATIDE) 5 micrograms two times a day within 60 minutes prior to meal. BYETTA 5 MCG PEN 5 MCG/0.02ML SOLN  (EXENATIDE) 5 micrograms two times a day within 60 minutes prior to meal. EXEL PEN NEEDLES 1/4" 31G X 6 MM MISC (INSULIN PEN NEEDLE) TO use with Byetta, twice daily. EXEL PEN NEEDLES 1/4" 31G X 6 MM MISC (INSULIN PEN NEEDLE) TO use with Byetta, twice daily. MACROBID 100 MG CAPS (NITROFURANTOIN MONOHYD MACRO) 1 tab by mouth two times a day x 7 days MACROBID 100 MG CAPS (NITROFURANTOIN MONOHYD MACRO) 1 tab by mouth two times a day x 7 days JANUVIA 100 MG TABS (SITAGLIPTIN PHOSPHATE) Take 1 tab by mouth daily JANUVIA 100 MG TABS (SITAGLIPTIN PHOSPHATE) Take 1 tab by mouth daily LANTUS SOLOSTAR 100 UNIT/ML  SOLN (INSULIN GLARGINE) 20 units qhs LANTUS SOLOSTAR 100 UNIT/ML  SOLN (INSULIN GLARGINE) 120 units at bedtime LANTUS SOLOSTAR 100 UNIT/ML  SOLN (INSULIN GLARGINE) 5 units qhs LANTUS SOLOSTAR 100 UNIT/ML  SOLN (INSULIN GLARGINE) 105 units at bedtime LANTUS SOLOSTAR 100 UNIT/ML  SOLN (INSULIN GLARGINE) 123 units at bedtime LANTUS SOLOSTAR 100 UNIT/ML  SOLN (INSULIN GLARGINE) 65 units qhs LANTUS SOLOSTAR 100 UNIT/ML  SOLN (INSULIN GLARGINE) 60 units at two times a day LANTUS SOLOSTAR 100 UNIT/ML  SOLN (INSULIN GLARGINE) 60 units, two times a day 123 units, 21 day supply MELOXICAM 15 MG TABS (MELOXICAM) one by mouth daily MELOXICAM 15 MG TABS (MELOXICAM) one by mouth daily * APIDA  APIDRA 100 UNIT/ML SOLN (INSULIN GLULISINE) 28 units two times a day CIPRO 500 MG TABS (CIPROFLOXACIN HCL) 1 by mouth 2 times daily x 3 days       Phone Note Refill Request Call back at Home Phone [phone] Message from:  Patient on November 14, 2009 8:43 AM  Refills Requested: Medication #1:  NITROLINGUAL 0.4 MG/SPRAY TL SOLN Use as needed for chest pain. [facility] [address], [place]  Initial call taken by: Harlon Flor,  November 14, 2009 8:43 AM    Prescriptions: NITROLINGUAL 0.4 MG/SPRAY TL SOLN (NITROGLYCERIN) Use as needed for chest pain.  #1 x 3   Entered by:   Bishop Dublin, CMA   Authorized  by:   Dolores Patty, MD, [facility]   Signed by:   Bishop Dublin, CMA on 11/14/2009   Method used:   Electronically to  [facility] [address] (retail)       [address]       [place], [place]  [address]       Ph: [phone]       Fax: [phone]   RxID:   [id number]

## 2010-07-05 NOTE — Assessment & Plan Note (Signed)
Summary: PER DR ARON/CLE   Vital Signs:  Patient profile:   65 year old female Height:      66 inches Weight:      180 pounds BMI:     29.16 Temp:     98.0 degrees F oral Pulse rate:   80 / minute Pulse rhythm:   regular BP sitting:   130 / 70  (left arm) Cuff size:   regular  Vitals Entered By: Linde Gillis CMA Duncan Dull) (March 03, 2010 10:48 AM) CC: swollen saliva gland   History of Present Illness: 65 yo here for left sided facial pain, swelling and warmth that started while eating breakfast this morning.  H/o parotiditis in past. Patient has been afebrile.  Denies any nausea or vomiting. No redness.  Current Medications (verified): 1)  Bufferin 325 Mg Tabs (Aspirin Buf(Cacarb-Mgcarb-Mgo)) .Marland Kitchen.. 1 By Mouth Daily 2)  Plavix 75 Mg  Tabs (Clopidogrel Bisulfate) .... Take 1 Tablet By Mouth Once A Day 3)  Nitrolingual 0.4 Mg/spray Tl Soln (Nitroglycerin) .... Use As Needed For Chest Pain. 4)  Accu-Chek Aviva  Strp (Glucose Blood) .... Check Blood Sugar Twice A Day 5)  Fish Oil   Oil (Fish Oil) .... 1000mg  Two Times A Day 6)  Fluoxetine Hcl 40 Mg  Caps (Fluoxetine Hcl) .Marland Kitchen.. 1 By Mouth Once Daily 7)  Trazodone Hcl 100 Mg Tabs (Trazodone Hcl) .... 2-3 Tabs At Bedtime For Insomnia 8)  Lansoprazole 30 Mg Cpdr (Lansoprazole) .... Take 1 Tablet By Mouth Once Daily 9)  Caltrate 600+d   Tabs (Calcium Carbonate-Vitamin D Tabs) .Marland Kitchen.. 1 Daily 10)  Claritin-D 24 Hour 10-240 Mg Xr24h-Tab (Loratadine-Pseudoephedrine) .... Take 1 Tablet By Mouth Once A Day 11)  Synthroid 125 Mcg Tabs (Levothyroxine Sodium) .... Take One Tablet By Mouth Daily 12)  Lantus Solostar 100 Unit/ml  Soln (Insulin Glargine) .... 60 Units, Two Times A Day 123 Units, 21 Day Supply 13)  Apidra 100 Unit/ml Soln (Insulin Glulisine) .... 28 Units Two Times A Day 14)  Novofine 32g X 6 Mm Misc (Insulin Pen Needle) .... To Use Twice Daily With Byetta 15)  Cephalexin 500 Mg  Tabs (Cephalexin) .... Take One By Mouth 4 Times Daily X  10 Days 16)  Tramadol Hcl 50 Mg  Tabs (Tramadol Hcl) .Marland Kitchen.. 1-2 Tab By Mouth Three Times A Day As Needed Pain  Allergies: 1)  ! Codeine Sulfate (Codeine Sulfate) 2)  ! Percocet 3)  ! Wellbutrin 4)  ! Demerol (Meperidine Hcl) 5)  ! Neurontin 6)  ! Zyprexa 7)  ! Mirtazapine 8)  ! Darvocet-N 100 9)  ! Lipitor 10)  ! Morphine Sulfate Cr (Morphine Sulfate) 11)  ! Crestor (Rosuvastatin Calcium) 12)  ! Lisinopril (Lisinopril) 13)  ! Nasonex (Mometasone Furoate) 14)  ! Zolpidem Tartrate (Zolpidem Tartrate) 15)  * Meperidine & Related Group 16)  Riomet (Metformin Hcl) 17)  Effexor  Past History:  Past Medical History: Last updated: 01/28/2009 Allergic rhinitis Diabetes mellitus, type II 2/04 Diverticulosis, colon GERD Depression--chronic Hypercholesterolemia    --intolerant of statins and niaspan Sleep apnea-mild    --PSG 12/09/07 RDI 12 Coronary artery disease s/p stent ACE Inhibitor induced cough  Past Surgical History: Last updated: 02/03/2010 Cholecystectomy 1993 Colectomy Lap.sigmoid 1/04 Hysterectomy 1976 Tonsillectomy 1957 Bartholin cyst Tubal ligation Bladder suspension Breast cyst removed x 3 Vaginal deliveries x 3 Stress ntivuew-basically neg EF 65% Diverticulitis 11/00 Pneumonia  3/09 Stents x 2--10/2007---PROMUS (RCA, LCX) thyroidectomy 01/2010  Family History: Last updated: 02-26-09 Mom died of  breast cancer (also 2 aunts) Dad with HTN, died of mesothelioma Brother died in MVA  Social History: Last updated: 06/23/2008 Married---3 children Malen Gauze parent but no children lately Stopped smoking around 9/09 No alcohol  Risk Factors: Smoking Status: current (09/09/2006) Packs/Day: 1/2 (12/09/2007)  Review of Systems      See HPI General:  Denies chills and fever. ENT:  Denies difficulty swallowing. GI:  Denies nausea and vomiting.  Physical Exam  General:  alert, appears fatigued, no increased WOB Head:  Left side of face swollen, palpable  enlarged parotid gland, no palpable stones. Mild warmth, no erythema. Mouth:  dry mouth Psych:  normally interactive, good eye contact, not anxious appearing, and dysphoric affect.     Impression & Recommendations:  Problem # 1:  PAROTITIS, LEFT (ICD-527.2) Assessment New  Cephalexin 500 mg qid x 10 days. Tramadol as needed pain. Given red flag symptoms requiring immediate follow up and CT. Pt is aware, continue lemon drops as well.  Orders: Prescription Created Electronically [phone])  Complete Medication List: 1)  Bufferin 325 Mg Tabs (Aspirin buf(cacarb-mgcarb-mgo)) .Marland Kitchen.. 1 by mouth daily 2)  Plavix 75 Mg Tabs (Clopidogrel bisulfate) .... Take 1 tablet by mouth once a day 3)  Nitrolingual 0.4 Mg/spray Tl Soln (Nitroglycerin) .... Use as needed for chest pain. 4)  Accu-chek Aviva Strp (Glucose blood) .... Check blood sugar twice a day 5)  Fish Oil Oil (Fish oil) .... 1000mg  two times a day 6)  Fluoxetine Hcl 40 Mg Caps (Fluoxetine hcl) .Marland Kitchen.. 1 by mouth once daily 7)  Trazodone Hcl 100 Mg Tabs (Trazodone hcl) .... 2-3 tabs at bedtime for insomnia 8)  Lansoprazole 30 Mg Cpdr (Lansoprazole) .... Take 1 tablet by mouth once daily 9)  Caltrate 600+d Tabs (Calcium carbonate-vitamin d tabs) .Marland Kitchen.. 1 daily 10)  Claritin-d 24 Hour 10-240 Mg Xr24h-tab (Loratadine-pseudoephedrine) .... Take 1 tablet by mouth once a day 11)  Synthroid 125 Mcg Tabs (Levothyroxine sodium) .... Take one tablet by mouth daily 12)  Lantus Solostar 100 Unit/ml Soln (Insulin glargine) .... 60 units, two times a day 123 units, 21 day supply 13)  Apidra 100 Unit/ml Soln (Insulin glulisine) .... 28 units two times a day 14)  Novofine 32g X 6 Mm Misc (Insulin pen needle) .... To use twice daily with byetta 15)  Cephalexin 500 Mg Tabs (Cephalexin) .... Take one by mouth 4 times daily x 10 days 16)  Tramadol Hcl 50 Mg Tabs (Tramadol hcl) .Marland Kitchen.. 1-2 tab by mouth three times a day as needed pain  Patient Instructions: 1)  Good  to see you, Ms. Heinrich. 2)  I did not feel a stone in your salivary gland today but that does not mean it's not there. 3)  If your symptoms worsen over the weekend, please go to the ER as they will do a CT scan. 4)  Please stop taking Claritin for now. 5)  Suck on lemon hard candies. Prescriptions: TRAMADOL HCL 50 MG  TABS (TRAMADOL HCL) 1-2 tab by mouth three times a day as needed pain  #90 x 0   Entered and Authorized by:   Ruthe Mannan MD   Signed by:   Ruthe Mannan MD on 03/03/2010   Method used:   Electronically to        [facility] [address]* (retail)       [address]       [place], [place]  [address]       Ph: [phone]  Fax: [phone]   RxID:   [id number] CEPHALEXIN 500 MG  TABS (CEPHALEXIN) take one by mouth 4 times daily x 10 days  #40 x 0   Entered and Authorized by:   Ruthe Mannan MD   Signed by:   Ruthe Mannan MD on 03/03/2010   Method used:   Electronically to        [facility] [address] (retail)       [address]       [place], [place]  [address]       Ph: [phone]       Fax: [phone]   RxID:   [id number]   Current Allergies (reviewed today): ! CODEINE SULFATE (CODEINE SULFATE) ! PERCOCET ! WELLBUTRIN ! DEMEROL (MEPERIDINE HCL) ! NEURONTIN ! ZYPREXA ! MIRTAZAPINE ! DARVOCET-N 100 ! LIPITOR ! MORPHINE SULFATE CR (MORPHINE SULFATE) ! CRESTOR (ROSUVASTATIN CALCIUM) ! LISINOPRIL (LISINOPRIL) ! NASONEX (MOMETASONE FUROATE) ! ZOLPIDEM TARTRATE (ZOLPIDEM TARTRATE) * MEPERIDINE & RELATED GROUP RIOMET (METFORMIN HCL) EFFEXOR

## 2010-07-05 NOTE — Letter (Signed)
Summary: [facility]   Imported By: Lanelle Bal 02/07/2010 08:36:03  _____________________________________________________________________  External Attachment:    Type:   Image     Comment:   External Document

## 2010-07-05 NOTE — Progress Notes (Signed)
Summary: refill request for trazodone  Phone Note Refill Request Message from:  Fax from Pharmacy  Refills Requested: Medication #1:  TRAZODONE HCL 100 MG TABS 2-3 tabs at bedtime for insomnia   Last Refilled: 03/14/2010 Faxed request from [facility], [phone].  Initial call taken by: Lowella Petties CMA, AAMA,  April 17, 2010 10:45 AM    Prescriptions: TRAZODONE HCL 100 MG TABS (TRAZODONE HCL) 2-3 tabs at bedtime for insomnia  #90 x 3   Entered and Authorized by:   Ruthe Mannan MD   Signed by:   Ruthe Mannan MD on 04/17/2010   Method used:   Electronically to        [facility] [address] (retail)       [address]       [place], [place]  [address]       Ph: [phone]       Fax: [phone]   RxID:   [id number]

## 2010-07-05 NOTE — Assessment & Plan Note (Signed)
Summary: RETAINING FLUID,COLD,WHEEZING/CLE   Vital Signs:  Patient profile:   65 year old female Weight:      182.75 pounds O2 Sat:      98 % on Room air Temp:     98.2 degrees F oral Pulse rate:   84 / minute Pulse rhythm:   regular BP sitting:   120 / 74  (left arm) Cuff size:   regular  Vitals Entered By: Selena Batten Dance CMA Duncan Dull) (April 17, 2010 11:35 AM)  O2 Flow:  Room air CC: Retaining fluid and wheezing   History of Present Illness: CC: retaining fluid, chest tightness and wheezing  issues: chest tightness am and pm, wheezing, retaining fluid, cramps in legs and toes (s/p thyroid surg 01/2010).  SOB going on for years.  hasn't seen lung doctor yet.    1d h/o increased dry cough, + temp to 100 this am.  + sinus HA in AM's.  + more SOB/tightness than normal.  Used vicks which helped some.  Hasn't tried anything else.    No chills, abd pain, v/d, myalgias.  No nasal congestion.    h/o bronchitis in fall.  going to [facility].  wants to get checked out prior to leaving.  Husband smokes outside.  Pt quit 2 1/2 yrs ago.  h/o OSA, not on anything.  has tried CPAP x 2, didn't like.  told may have mild COPD.  takes claritin D, takes allergy shots.    leg cramps mainly in toes B feet.  wonders if ca level low  + multiple pain med allergies, wants [place] to know that can take Nucynta fine.  stopped xanax because didn't feel helped.  Current Medications (verified): 1)  Bufferin 325 Mg Tabs (Aspirin Buf(Cacarb-Mgcarb-Mgo)) .Marland Kitchen.. 1 By Mouth Daily 2)  Plavix 75 Mg  Tabs (Clopidogrel Bisulfate) .... Take 1 Tablet By Mouth Once A Day 3)  Nitrolingual 0.4 Mg/spray Tl Soln (Nitroglycerin) .... Use As Needed For Chest Pain. 4)  Accu-Chek Aviva  Strp (Glucose Blood) .... Check Blood Sugar Twice A Day 5)  Fluoxetine Hcl 40 Mg  Caps (Fluoxetine Hcl) .Marland Kitchen.. 1 By Mouth Once Daily 6)  Trazodone Hcl 100 Mg Tabs (Trazodone Hcl) .... 2-3 Tabs At Bedtime For Insomnia 7)  Lansoprazole 30 Mg Cpdr  (Lansoprazole) .... Take 1 Tablet By Mouth Once Daily 8)  Caltrate 600+d   Tabs (Calcium Carbonate-Vitamin D Tabs) .Marland Kitchen.. 1 Daily 9)  Synthroid 125 Mcg Tabs (Levothyroxine Sodium) .... Take One Tablet By Mouth Daily 10)  Lantus Solostar 100 Unit/ml  Soln (Insulin Glargine) .... 65 Units Daily. 11)  Apidra 100 Unit/ml Soln (Insulin Glulisine) .... 28 Units Two Times A Day 12)  Onetouch Ultra Blue  Strp (Glucose Blood) .... Use As Directed. 13)  Triamcinolone Acetonide 0.1 % Oint (Triamcinolone Acetonide) .... Apply As Directed  Allergies: 1)  ! Codeine Sulfate (Codeine Sulfate) 2)  ! Percocet 3)  ! Wellbutrin 4)  ! Demerol (Meperidine Hcl) 5)  ! Neurontin 6)  ! Zyprexa 7)  ! Mirtazapine 8)  ! Darvocet-N 100 9)  ! Lipitor 10)  ! Morphine Sulfate Cr (Morphine Sulfate) 11)  ! Crestor (Rosuvastatin Calcium) 12)  ! Lisinopril (Lisinopril) 13)  ! Nasonex (Mometasone Furoate) 14)  ! Zolpidem Tartrate (Zolpidem Tartrate) 15)  * Meperidine & Related Group 16)  Riomet (Metformin Hcl) 17)  Effexor  Past History:  Past Medical History: Last updated: 01/28/2009 Allergic rhinitis Diabetes mellitus, type II 2/04 Diverticulosis, colon GERD Depression--chronic Hypercholesterolemia    --  intolerant of statins and niaspan Sleep apnea-mild    --PSG 12/09/07 RDI 12 Coronary artery disease s/p stent ACE Inhibitor induced cough  Social History: Last updated: 06/23/2008 Married---3 children Malen Gauze parent but no children lately Stopped smoking around 9/09 No alcohol  Review of Systems       per HPI  Physical Exam  General:  WDWN, NAD O2 98% Head:  normocephalic and atraumatic.   Eyes:  vision grossly intact and pupils equal.   Ears:  R ear normal and L ear normal.   Nose:  no external deformity.   Mouth:  MMM, no pharyngeal erythema Neck:  No deformities, masses, or tenderness noted. Lungs:  Normal respiratory effort, chest expands symmetrically. Lungs are clear to auscultation, no  crackles or wheezes.  slightly coarse Heart:  Normal rate and regular rhythm. S1 and S2 normal without gallop, murmur, click, rub or other extra sounds. Pulses:  R and L posterior tibial pulses are full and equal bilaterally  Extremities:  trace  left pedal edema and trace right pedal edema. mild pitting.  no calf tenderness Skin:  no rashes, no suspicious lesions, and no ulcerations.     Impression & Recommendations:  Problem # 1:  MUSCLE CRAMPS, FOOT (ICD-729.82) check Ca, Mg, K.  hold off on diuretic until verify normal lytes.  Orders: TLB-BMP (Basic Metabolic Panel-BMET) (80048-METABOL) TLB-Magnesium (Mg) (83735-MG)  Problem # 2:  COUGH (ICD-786.2) sounds more viral vs mild COPD.  no need for abx or steroids.  provided with alb inh for trip per patient preference.  noted PCP wants to send her to pulm for eval, encouraged pt to call and reschedule appt.  red flags to return discussed.  Complete Medication List: 1)  Bufferin 325 Mg Tabs (Aspirin buf(cacarb-mgcarb-mgo)) .Marland Kitchen.. 1 by mouth daily 2)  Plavix 75 Mg Tabs (Clopidogrel bisulfate) .... Take 1 tablet by mouth once a day 3)  Nitrolingual 0.4 Mg/spray Tl Soln (Nitroglycerin) .... Use as needed for chest pain. 4)  Accu-chek Aviva Strp (Glucose blood) .... Check blood sugar twice a day 5)  Fluoxetine Hcl 40 Mg Caps (Fluoxetine hcl) .Marland Kitchen.. 1 by mouth once daily 6)  Trazodone Hcl 100 Mg Tabs (Trazodone hcl) .... 2-3 tabs at bedtime for insomnia 7)  Lansoprazole 30 Mg Cpdr (Lansoprazole) .... Take 1 tablet by mouth once daily 8)  Caltrate 600+d Tabs (Calcium carbonate-vitamin d tabs) .Marland Kitchen.. 1 daily 9)  Synthroid 125 Mcg Tabs (Levothyroxine sodium) .... Take one tablet by mouth daily 10)  Lantus Solostar 100 Unit/ml Soln (Insulin glargine) .... 65 units daily. 11)  Apidra 100 Unit/ml Soln (Insulin glulisine) .... 28 units two times a day 12)  Onetouch Ultra Blue Strp (Glucose blood) .... Use as directed. 13)  Triamcinolone Acetonide 0.1 %  Oint (Triamcinolone acetonide) .... Apply as directed  Patient Instructions: 1)  This could be a viral infection.  It could be early COPD. 2)  Treat for now with albuterol inhaler to see if it helps. 3)  Reschedule appointment with pulmonary for PFTs (pulmonary function tests) to see how your lungs are. 4)  If you start having fevers >101.5, nausea/vomiting, or cough becomes productive, you may need to return to be seen.   Orders Added: 1)  TLB-BMP (Basic Metabolic Panel-BMET) [80048-METABOL] 2)  TLB-Magnesium (Mg) [83735-MG] 3)  Est. Patient Level III [[address]]    Current Allergies (reviewed today): ! CODEINE SULFATE (CODEINE SULFATE) ! PERCOCET ! WELLBUTRIN ! DEMEROL (MEPERIDINE HCL) ! NEURONTIN ! ZYPREXA ! MIRTAZAPINE ! DARVOCET-N 100 !  LIPITOR ! MORPHINE SULFATE CR (MORPHINE SULFATE) ! CRESTOR (ROSUVASTATIN CALCIUM) ! LISINOPRIL (LISINOPRIL) ! NASONEX (MOMETASONE FUROATE) ! ZOLPIDEM TARTRATE (ZOLPIDEM TARTRATE) * MEPERIDINE & RELATED GROUP RIOMET (METFORMIN HCL) EFFEXOR

## 2010-07-05 NOTE — Progress Notes (Signed)
Summary: Surgical Clearance   Phone Note Outgoing Call   Call placed by: Benedict Needy, RN,  December 27, 2009 10:06 AM Call placed to: Patient Summary of Call: Attempted to call pt.  Surgical clearance form faxed to office. Pt last seen in office 6/13 and was having chest pain. Calling to follow up. Clearance needs to be faxed to [phone] [facility]). LMOM TCB  Initial call taken by: Benedict Needy, RN,  December 27, 2009 10:07 AM  Follow-up for Phone Call        Pt has not had any further chest pain since last OV.  Dr. Willeen Cass would like advise on stopping plavix and asa and when to restart.  pt has drug eluting stent in may 2009. Please advise. Benedict Needy, RN  December 27, 2009 2:13 PM      Appended Document: Surgical Clearance ok to stop plavix 7 days before and restart when Dr. Willeen Cass feels it is safe. Preop ok, she is acceptable risk. Dossie Arbour  Appended Document: Surgical Clearance clearance faxed to Dr. Vincente Poli office

## 2010-07-05 NOTE — Progress Notes (Signed)
Summary: Elevated BS  Phone Note Call from Patient Call back at [phone]   Caller: Patient Call For: Megan Mannan MD Summary of Call: Patient is calling in regarding her BS's. Blood sugar was 521 last night around 6:00PM. Patient states that she tried to call the office and could not get thru to the on call doctor because the system was not working right. Patient states that she took a shower to get ready to go to the ER and BS dropped down to 378. BS today was 294 and now 376. What should she do? Patient is now on lantus 33 units at bedtime. Please advise. Initial call taken by: Melody Comas,  June 15, 2009 11:59 AM  Follow-up for Phone Call        Please ask her to increase the lantus to 40 units daily  Have her continue to increase by 3 units every other day until fasting sugars are consistently under 200. She may need 60, 70 or more units daily to achieve this so she should just continue to raise the insulin regularly unitil sugars are lower Follow-up by: Cindee Salt MD,  June 15, 2009 12:25 PM  Additional Follow-up for Phone Call Additional follow up Details #1::        Patient advised Additional Follow-up by: Melody Comas,  June 15, 2009 2:03 PM

## 2010-07-05 NOTE — Progress Notes (Signed)
Summary: regarding lab appt  Phone Note Call from Patient   Caller: Patient Call For: Ruthe Mannan MD Summary of Call: Pt is scheduled to have BUN/ Creatinine drawn tomorrow for CT on 9/21.  She has recently had blood work through cardiologist, which included these tests.  Can we cancel the appt for tomorrow? Initial call taken by: Lowella Petties CMA,  February 14, 2010 10:48 AM  Follow-up for Phone Call        yes if those labs are sent here or to place where she is having CT scan. Ruthe Mannan MD  February 14, 2010 11:03 AM  Lab appt cancelled, we do have lab results in EMR. Follow-up by: Linde Gillis CMA Duncan Dull),  February 15, 2010 9:38 AM

## 2010-07-05 NOTE — Op Note (Signed)
Summary: Total Thyroidectomy  Total Thyroidectomy   Imported By: Harlon Flor 02/14/2010 08:36:42  _____________________________________________________________________  External Attachment:    Type:   Image     Comment:   External Document

## 2010-07-05 NOTE — Progress Notes (Signed)
Summary: blood sugars  Phone Note Call from Patient Call back at Home Phone [phone]   Caller: Patient Call For: Megan Mannan MD Summary of Call: Patient say that she is to report her blood Sugar and it was 274 before breakfast and 379 after lunch today.  Initial call taken by: Melody Comas,  June 10, 2009 4:54 PM  Follow-up for Phone Call        Please call her in the morning and ask her to increase Lantus by another 3 units. Thank you. Follow-up by: Megan Mannan MD,  June 10, 2009 6:27 PM     Appended Document: blood sugars Advised pt. She will call back and report.

## 2010-07-05 NOTE — Progress Notes (Signed)
Summary: pt would like lab results   Phone Note Call from Patient Call back at Home Phone [phone]   Caller: Patient Reason for Call: Talk to Nurse, Talk to Doctor, Lab or Test Results Summary of Call: pt would like results of her lab work Initial call taken by: Omer Jack,  February 09, 2010 9:45 AM  Follow-up for Phone Call        pt is aware of results asked her to see PCP Benedict Needy, RN  February 09, 2010 2:49 PM

## 2010-07-05 NOTE — Progress Notes (Signed)
Summary: tal to nurse   Phone Note Call from Patient Call back at Home Phone [phone]   Caller: Patient Summary of Call: pt c/o of SOB. wants DR Bensimhon to know. I scheduled her for 9/2 @ 10 to see Dr Gala Romney Initial call taken by: Edman Circle,  January 26, 2010 12:52 PM  Follow-up for Phone Call        pt aware of appts.  will come for echo on tue aug 30 and see dr. Gala Romney on sept 2 Follow-up by: Benedict Needy, RN,  January 27, 2010 9:59 AM

## 2010-07-05 NOTE — Progress Notes (Signed)
Summary: LAB RESULTS   Phone Note Call from Patient Call back at Home Phone [phone]   Caller: SELF Call For: Nidhi Jacome Summary of Call: WOULD LIKE LAB RESULTS Initial call taken by: Harlon Flor,  February 08, 2010 4:21 PM  Follow-up for Phone Call        Marchelle Folks - Once again. Please send these type of messages to [place] and she can contact me if she needs. Thanks. Dolores Patty, MD, [facility]  February 08, 2010 6:56 PM  pt is aware of lab results asked her to see PCP Benedict Needy, RN  February 09, 2010 2:50 PM

## 2010-07-05 NOTE — Letter (Signed)
Summary: [facility]   Imported By: Lanelle Bal 03/08/2010 10:33:44  _____________________________________________________________________  External Attachment:    Type:   Image     Comment:   External Document

## 2010-07-05 NOTE — Progress Notes (Signed)
Summary: Needs new Lantus rx  Phone Note Call from Patient Call back at [phone]   Caller: Patient Call For: Megan Mannan MD Summary of Call: Pt needs new rx for Lantus insuliln. Pt is present taking 60 units of Lantus insulin at night. Blood sugar non fasting on 06/20/09 was 209 in am and 367 in pm. On 06/21/09 blood sugar non fasting was 308 in am and 339 in pm. Pt said blood sugar usually runs non fasting between 200 and into the 300's. P tsaid needs new rx for Lantus insuliln because she is still having to increase the dose. Pt uses [facility] on [address] as her pharmacy [phone]. Please advise.  Initial call taken by: Lewanda Rife LPN,  June 22, 2009 9:47 AM  Follow-up for Phone Call        Will send in new prescription.  Please ask her to increase Lantus another 5 Units.  How many units is she taking now? Follow-up by: Megan Mannan MD,  June 22, 2009 9:50 AM  Additional Follow-up for Phone Call Additional follow up Details #1::        Patient Advised.   As listed in the beginning of the original note, I confirmed that she was taking 60 units.  Instructed to increase to 65 units.  She said Dr. Alphonsus Sias, in your absence, instructed her to increase by 3 units every other day until she was getting her BS under 200.  Because of that increase, she needs new Rx.  She will now go to 65 units as instructed by you. Additional Follow-up by: Delilah Shan CMA (AAMA),  June 22, 2009 10:23 AM    New/Updated Medications: LANTUS SOLOSTAR 100 UNIT/ML  SOLN (INSULIN GLARGINE) 65 units qhs Prescriptions: LANTUS SOLOSTAR 100 UNIT/ML  SOLN (INSULIN GLARGINE) 65 units qhs  #1 x 1   Entered and Authorized by:   Megan Mannan MD   Signed by:   Megan Mannan MD on 06/22/2009   Method used:   Electronically to        [facility] [address] (retail)       [address]       [place], [place]  [address]       Ph: [phone]       Fax: [phone]   RxID:   [id number]

## 2010-07-05 NOTE — Assessment & Plan Note (Signed)
Summary: BREATHING PROBLEMS X 2 MTHS/CLE   Vital Signs:  Patient profile:   65 year old female Height:      66 inches Weight:      178 pounds BMI:     28.83 O2 Sat:      97 % on Room air Temp:     98.1 degrees F oral Pulse rate:   66 / minute Pulse rhythm:   regular BP sitting:   120 / 70  (left arm) Cuff size:   regular  Vitals Entered By: Linde Gillis CMA Duncan Dull) (March 16, 2010 9:38 AM)  O2 Flow:  Room air CC: breathing problems   History of Present Illness: 65 yo here for recurrent breathing problems.  This has been ongoing for several years but she feels it is worse over the past few months. She is short of breath at rest and with exertion.  No wheezing, no cough.  Does have h/o sleep apnea, evaluated by Dr. Craige Cotta but has not been wearing it.  Has tried several different masks, does not like how any of them fit.  Has diagnosis of early COPD, not on any meds.  CXR on 02/03/10- no effusion or airspace disease. Chest CT on 8/23- small nodule left long/ pleural thickening (father died of mesothelioma). Chest CT on 9/23- decrease in size of pleural thickening, no acute findings.  Gets very anxious when she cant catch her breath, makes symptoms worse.  Current Medications (verified): 1)  Bufferin 325 Mg Tabs (Aspirin Buf(Cacarb-Mgcarb-Mgo)) .Marland Kitchen.. 1 By Mouth Daily 2)  Plavix 75 Mg  Tabs (Clopidogrel Bisulfate) .... Take 1 Tablet By Mouth Once A Day 3)  Nitrolingual 0.4 Mg/spray Tl Soln (Nitroglycerin) .... Use As Needed For Chest Pain. 4)  Accu-Chek Aviva  Strp (Glucose Blood) .... Check Blood Sugar Twice A Day 5)  Fluoxetine Hcl 40 Mg  Caps (Fluoxetine Hcl) .Marland Kitchen.. 1 By Mouth Once Daily 6)  Trazodone Hcl 100 Mg Tabs (Trazodone Hcl) .... 2-3 Tabs At Bedtime For Insomnia 7)  Lansoprazole 30 Mg Cpdr (Lansoprazole) .... Take 1 Tablet By Mouth Once Daily 8)  Caltrate 600+d   Tabs (Calcium Carbonate-Vitamin D Tabs) .Marland Kitchen.. 1 Daily 9)  Synthroid 125 Mcg Tabs (Levothyroxine Sodium) ....  Take One Tablet By Mouth Daily 10)  Lantus Solostar 100 Unit/ml  Soln (Insulin Glargine) .... 60 Units, Two Times A Day 123 Units, 21 Day Supply 11)  Apidra 100 Unit/ml Soln (Insulin Glulisine) .... 28 Units Two Times A Day 12)  Novofine 32g X 6 Mm Misc (Insulin Pen Needle) .... To Use Twice Daily With Byetta 13)  Onetouch Ultra Blue  Strp (Glucose Blood) .... Use As Directed. 14)  Alprazolam 0.25 Mg Tabs (Alprazolam) .Marland Kitchen.. 1-2 Tab Three Times A Day As Needed For Anxiety or Shortness of Breath.  Allergies: 1)  ! Codeine Sulfate (Codeine Sulfate) 2)  ! Percocet 3)  ! Wellbutrin 4)  ! Demerol (Meperidine Hcl) 5)  ! Neurontin 6)  ! Zyprexa 7)  ! Mirtazapine 8)  ! Darvocet-N 100 9)  ! Lipitor 10)  ! Morphine Sulfate Cr (Morphine Sulfate) 11)  ! Crestor (Rosuvastatin Calcium) 12)  ! Lisinopril (Lisinopril) 13)  ! Nasonex (Mometasone Furoate) 14)  ! Zolpidem Tartrate (Zolpidem Tartrate) 15)  * Meperidine & Related Group 16)  Riomet (Metformin Hcl) 17)  Effexor  Past History:  Past Medical History: Last updated: 01/28/2009 Allergic rhinitis Diabetes mellitus, type II 2/04 Diverticulosis, colon GERD Depression--chronic Hypercholesterolemia    --intolerant of statins and niaspan  Sleep apnea-mild    --PSG 12/09/07 RDI 12 Coronary artery disease s/p stent ACE Inhibitor induced cough  Past Surgical History: Last updated: 02/03/2010 Cholecystectomy 1993 Colectomy Lap.sigmoid 1/04 Hysterectomy 1976 Tonsillectomy 1957 Bartholin cyst Tubal ligation Bladder suspension Breast cyst removed x 3 Vaginal deliveries x 3 Stress ntivuew-basically neg EF 65% Diverticulitis 11/00 Pneumonia  3/09 Stents x 2--10/2007---PROMUS (RCA, LCX) thyroidectomy 01/2010  Family History: Last updated: Mar 17, 2009 Mom died of breast cancer (also 2 aunts) Dad with HTN, died of mesothelioma Brother died in MVA  Social History: Last updated: 06/23/2008 Married---3 children Malen Gauze parent but no  children lately Stopped smoking around 9/09 No alcohol  Risk Factors: Smoking Status: current (09/09/2006) Packs/Day: 1/2 (12/09/2007)  Review of Systems      See HPI General:  Complains of malaise; denies fever. CV:  Denies chest pain or discomfort. Resp:  Complains of shortness of breath; denies sputum productive and wheezing.  Physical Exam  General:  alert, appears fatigued, no increased WOB O2 sat 97% Head:  normocephalic and atraumatic.   Eyes:  vision grossly intact and pupils equal.   Ears:  R ear normal and L ear normal.   Nose:  no external deformity.   Mouth:  dry mouth Lungs:  Normal respiratory effort, chest expands symmetrically. Lungs are clear to auscultation, no crackles or wheezes. Heart:  Normal rate and regular rhythm. S1 and S2 normal without gallop, murmur, click, rub or other extra sounds. Extremities:   trace  left pedal edema and trace right pedal edema.   mild pitting. Psych:  normally interactive, good eye contact, not anxious appearing, and dysphoric affect.     Impression & Recommendations:  Problem # 1:  SHORTNESS OF BREATH (ICD-786.05) Assessment Deteriorated Has been on chronic issue.  Will refer to pulmonary for PFTs and further workup.  Imaging has been essentially normal. Since this is likely multifactorial with anxiety playing a large component, will add Xanax as needed.  Also needs to be refitted for CPAP.  Complete Medication List: 1)  Bufferin 325 Mg Tabs (Aspirin buf(cacarb-mgcarb-mgo)) .Marland Kitchen.. 1 by mouth daily 2)  Plavix 75 Mg Tabs (Clopidogrel bisulfate) .... Take 1 tablet by mouth once a day 3)  Nitrolingual 0.4 Mg/spray Tl Soln (Nitroglycerin) .... Use as needed for chest pain. 4)  Accu-chek Aviva Strp (Glucose blood) .... Check blood sugar twice a day 5)  Fluoxetine Hcl 40 Mg Caps (Fluoxetine hcl) .Marland Kitchen.. 1 by mouth once daily 6)  Trazodone Hcl 100 Mg Tabs (Trazodone hcl) .... 2-3 tabs at bedtime for insomnia 7)  Lansoprazole 30 Mg  Cpdr (Lansoprazole) .... Take 1 tablet by mouth once daily 8)  Caltrate 600+d Tabs (Calcium carbonate-vitamin d tabs) .Marland Kitchen.. 1 daily 9)  Synthroid 125 Mcg Tabs (Levothyroxine sodium) .... Take one tablet by mouth daily 10)  Lantus Solostar 100 Unit/ml Soln (Insulin glargine) .... 60 units, two times a day 123 units, 21 day supply 11)  Apidra 100 Unit/ml Soln (Insulin glulisine) .... 28 units two times a day 12)  Novofine 32g X 6 Mm Misc (Insulin pen needle) .... To use twice daily with byetta 13)  Onetouch Ultra Blue Strp (Glucose blood) .... Use as directed. 14)  Alprazolam 0.25 Mg Tabs (Alprazolam) .Marland Kitchen.. 1-2 tab three times a day as needed for anxiety or shortness of breath.  Patient Instructions: 1)  Please stop by to see Shirlee Limerick on your way out. Prescriptions: ALPRAZOLAM 0.25 MG TABS (ALPRAZOLAM) 1-2 tab three times a day as needed for anxiety or shortness  of breath.  #60 x 0   Entered and Authorized by:   Ruthe Mannan MD   Signed by:   Ruthe Mannan MD on 03/16/2010   Method used:   Print then Give to Patient   RxID:   [id number] [facility] ULTRA BLUE  STRP (GLUCOSE BLOOD) Use as directed.  #100 x 6   Entered and Authorized by:   Ruthe Mannan MD   Signed by:   Ruthe Mannan MD on 03/16/2010   Method used:   Electronically to        [facility] [address]* (retail)       [address]       [place], [place]  [address]       Ph: [phone]       Fax: [phone]   RxID:   [id number]   Current Allergies (reviewed today): ! CODEINE SULFATE (CODEINE SULFATE) ! PERCOCET ! WELLBUTRIN ! DEMEROL (MEPERIDINE HCL) ! NEURONTIN ! ZYPREXA ! MIRTAZAPINE ! DARVOCET-N 100 ! LIPITOR ! MORPHINE SULFATE CR (MORPHINE SULFATE) ! CRESTOR (ROSUVASTATIN CALCIUM) ! LISINOPRIL (LISINOPRIL) ! NASONEX (MOMETASONE FUROATE) ! ZOLPIDEM TARTRATE (ZOLPIDEM TARTRATE) * MEPERIDINE & RELATED GROUP RIOMET (METFORMIN HCL) EFFEXOR

## 2010-07-05 NOTE — Assessment & Plan Note (Signed)
Summary: [facility]      Allergies Added:   Visit Type:  Follow-up Referring Provider:  Dr. Gala Romney Primary Provider:  Dr. Alphonsus Sias  CC:  chest pain dull ache on friday and saturday took nitro spray on saturday and chest pain improved.  No further chest pain since saturday. Marland Kitchen  History of Present Illness: [name] is a 65 year old woman with a history of hypertension, hyperlipidemia, diabetes, COPD, chronic cough and coronary artery disease, status post two-vessel angioplasty with Promus drug-eluting stents back in May 2009.   she presents today for routine evaluation with recent episodes of chest discomfort. 3 days ago, she had chest discomfort all day, with two-minute episodes presenting at rest. She had more chest discomfort the next day and took a nitroglycerin. The chest pain lasted from 3-10 minutes with nitroglycerin resolved quickly. She has symptoms of chronic shortness of breath and nausea, feels fatigued. She was not going to come to the office though is due to go on a cruise in one week's time and wanted to get checked out before she leaves for her daughter's wedding.  She's not been exercising to any significant degree is able to handle it without any significant chest discomfort. Most of her chest pain symptoms come on at rest when she is sitting. They typically last for several minutes before resolving.  Following with Dr. Craige Cotta for OSA and possible UPPP.    Current Medications (verified): 1)  Bufferin 325 Mg Tabs (Aspirin Buf(Cacarb-Mgcarb-Mgo)) .Marland Kitchen.. 1 By Mouth Daily 2)  Plavix 75 Mg  Tabs (Clopidogrel Bisulfate) .... Take 1 Tablet By Mouth Once A Day 3)  Nitrolingual 0.4 Mg/spray Tl Soln (Nitroglycerin) .... Use As Needed For Chest Pain. 4)  Accu-Chek Aviva  Strp (Glucose Blood) .... Check Blood Sugar Twice A Day 5)  Fish Oil   Oil (Fish Oil) .... 1000mg  Two Times A Day 6)  Fluoxetine Hcl 40 Mg  Caps (Fluoxetine Hcl) .Marland Kitchen.. 1 By Mouth Once Daily 7)  Trazodone Hcl  100 Mg Tabs (Trazodone Hcl) .... 2-3 Tabs At Bedtime For Insomnia 8)  Lansoprazole 30 Mg Cpdr (Lansoprazole) .... Take 1 Tablet By Mouth Once Daily 9)  Caltrate 600+d   Tabs (Calcium Carbonate-Vitamin D Tabs) .Marland Kitchen.. 1 Daily 10)  Claritin-D 24 Hour 10-240 Mg Xr24h-Tab (Loratadine-Pseudoephedrine) .... Take 1 Tablet By Mouth Once A Day 11)  Synthroid 88 Mcg Tabs (Levothyroxine Sodium) .... Take One By Mouth Daily 12)  Lantus Solostar 100 Unit/ml  Soln (Insulin Glargine) .... 60 Units, Two Times A Day 123 Units, 21 Day Supply 13)  Apidra 100 Unit/ml Soln (Insulin Glulisine) .... 28 Units Two Times A Day  Allergies (verified): 1)  ! Codeine Sulfate (Codeine Sulfate) 2)  ! Percocet 3)  ! Wellbutrin 4)  ! Demerol (Meperidine Hcl) 5)  ! Neurontin 6)  ! Zyprexa 7)  ! Mirtazapine 8)  ! Darvocet-N 100 9)  ! Lipitor 10)  ! Morphine Sulfate Cr (Morphine Sulfate) 11)  ! Crestor (Rosuvastatin Calcium) 12)  ! Lisinopril (Lisinopril) 13)  ! Nasonex (Mometasone Furoate) 14)  ! Zolpidem Tartrate (Zolpidem Tartrate) 15)  * Meperidine & Related Group 16)  Riomet (Metformin Hcl) 17)  Effexor  Past History:  Past Medical History: Last updated: 01/28/2009 Allergic rhinitis Diabetes mellitus, type II 2/04 Diverticulosis, colon GERD Depression--chronic Hypercholesterolemia    --intolerant of statins and niaspan Sleep apnea-mild    --PSG 12/09/07 RDI 12 Coronary artery disease s/p stent ACE Inhibitor induced cough  Past Surgical History: Last updated: 12/24/2008  Cholecystectomy 1993 Colectomy Lap.sigmoid 1/04 Hysterectomy 1976 Tonsillectomy 1957 Bartholin cyst Tubal ligation Bladder suspension Breast cyst removed x 3 Vaginal deliveries x 3 Stress ntivuew-basically neg EF 65% Diverticulitis 11/00 Pneumonia  3/09 Stents x 2--10/2007---PROMUS (RCA, LCX)  Family History: Last updated: March 08, 2009 Mom died of breast cancer (also 2 aunts) Dad with HTN, died of mesothelioma Brother died in  MVA  Social History: Last updated: 06/23/2008 Married---3 children Malen Gauze parent but no children lately Stopped smoking around 9/09 No alcohol  Risk Factors: Smoking Status: current (09/09/2006) Packs/Day: 1/2 (12/09/2007)  Review of Systems       The patient complains of chest pain and dyspnea on exertion.  The patient denies fever, weight loss, weight gain, vision loss, decreased hearing, hoarseness, syncope, peripheral edema, prolonged cough, abdominal pain, incontinence, muscle weakness, depression, and enlarged lymph nodes.    Vital Signs:  Patient profile:   65 year old female Height:      66 inches Weight:      179 pounds BMI:     29.00 Pulse rate:   75 / minute BP sitting:   125 / 75  (right arm) Cuff size:   regular  Vitals Entered By: Benedict Needy, RN (November 14, 2009 11:09 AM)  Physical Exam  General:  Well developed, well nourished, in no acute distress. Head:  normocephalic and atraumatic Neck:  Neck supple, no JVD. No masses, thyromegaly or abnormal cervical nodes. Chest Wall:  no deformities or breast masses noted Lungs:  Clear bilaterally to auscultation and percussion. Heart:  Non-displaced PMI, chest non-tender; regular rate and rhythm, S1, S2 without murmurs, rubs or gallops. Carotid upstroke normal, no bruit.  Pedals normal pulses. No edema, no varicosities. Abdomen:  Bowel sounds positive; abdomen soft and non-tender without masses Msk:  Back normal, normal gait. Muscle strength and tone normal. Pulses:  pulses normal in all 4 extremities Extremities:  No clubbing or cyanosis. Neurologic:  Alert and oriented x 3. Skin:  Intact without lesions or rashes. Psych:  Normal affect.    EKG  Procedure date:  11/14/2009  Findings:      Normal sinus rhythm with rate 76 beats per minute, no significant ST or T wave changes.  Impression & Recommendations:  Problem # 1:  CHEST TIGHTNESS-PRESSURE-OTHER ([id number]) recent episodes of chest pain are  somewhat atypical in nature and likely noncardiac. We will refill her nitroglycerin spray. I've asked her to exercise during the week and if she has no further episodes of chest pain, I feel it is safe for her to go on her cruise. If she continues to have symptoms, further workup can be initiated. She had a recent negative stress test in September of 2010.  Problem # 2:  HYPERTENSION, BENIGN (ICD-401.1) blood pressure is reasonably well controlled. I asked her to continue to monitor this. No medication changes were made.  The following medications were removed from the medication list:    Hydrochlorothiazide 25 Mg Tabs (Hydrochlorothiazide) .Marland Kitchen... Take 1 tablet by mouth once a day as needed Her updated medication list for this problem includes:    Bufferin 325 Mg Tabs (Aspirin buf(cacarb-mgcarb-mgo)) .Marland Kitchen... 1 by mouth daily  Problem # 3:  SHORTNESS OF BREATH (ICD-786.05) She has chronic shortness of breath with exertion and some of her symptoms may be due to being deconditioned. She does not do any exercise, does not walk on a regular basis. She needs to start walking more than that mentioned this to her.  The following medications were removed from  the medication list:    Hydrochlorothiazide 25 Mg Tabs (Hydrochlorothiazide) .Marland Kitchen... Take 1 tablet by mouth once a day as needed Her updated medication list for this problem includes:    Bufferin 325 Mg Tabs (Aspirin buf(cacarb-mgcarb-mgo)) .Marland Kitchen... 1 by mouth daily  Problem # 4:  HYPERCHOLESTEROLEMIA (ICD-272.0) She has a history of coronary artery disease. She is on aspirin. Stopped smoking 2 years ago. She needs to be on a low-dose cholesterol medication that she's had trouble tolerating his G-tube muscle ache. We will talk to her about this on her next visit and we will not start any medications prior to her cruise.  Patient Instructions: 1)  Your physician recommends that you continue on your current medications as directed. Please refer to the  Current Medication list given to you today.

## 2010-07-05 NOTE — Progress Notes (Signed)
Summary: needs albuterol sent in  Phone Note Call from Patient Call back at Home Phone [phone]   Caller: Patient Summary of Call: Pt states she was seen this morning and was told albuterol would be called in.  She checked with pharmacy and they dont have it.  Uses [facility] in [place]. Initial call taken by: Lowella Petties CMA, AAMA,  April 17, 2010 3:28 PM  Follow-up for Phone Call        done.  please notify patient. Follow-up by: Eustaquio Boyden  MD,  April 17, 2010 3:40 PM  Additional Follow-up for Phone Call Additional follow up Details #1::        I told pt to check with her pharmacy  Additional Follow-up by: Lowella Petties CMA, AAMA,  April 17, 2010 3:58 PM    New/Updated Medications: VENTOLIN HFA 108 (90 BASE) MCG/ACT AERS (ALBUTEROL SULFATE) 1-2 puffs Q4-6 hours as needed wheezing Prescriptions: VENTOLIN HFA 108 (90 BASE) MCG/ACT AERS (ALBUTEROL SULFATE) 1-2 puffs Q4-6 hours as needed wheezing  #1 x 2   Entered and Authorized by:   Eustaquio Boyden  MD   Signed by:   Eustaquio Boyden  MD on 04/17/2010   Method used:   Electronically to        [facility] [address] [address] (retail)       [address]       [place], [place]  [address]       Ph: [phone]       Fax: [phone]   RxID:   [id number]

## 2010-07-05 NOTE — Progress Notes (Signed)
Summary: CHEST PAIN   Phone Note Call from Patient Call back at Home Phone [phone]   Caller: SELF Call For: BENSIMHON Summary of Call: CHEST PAIN OFF AND ON SINCE FRIDAY-PT IS GOING ON A CRUISE THIS FRIDAY AND IS CONCERNED Initial call taken by: Harlon Flor,  November 14, 2009 8:44 AM  Follow-up for Phone Call        Called spoke with pt, pt states she had mild CP on and off on Friday, on Saturday had sharp CP in [facility] took NTG with relief, no further CP since.  Pt concerned going on cruise on Friday, wants to be evaluated prior to.  Made OV for 10:30am with Dr Mariah Milling today.  Follow-up by: Cloyde Reams RN,  November 14, 2009 9:17 AM

## 2010-07-05 NOTE — Progress Notes (Signed)
Summary: Blood sugar readings/steroid injections  Phone Note Call from Patient Call back at Home Phone [phone]   Caller: Patient Call For: Ruthe Mannan MD Summary of Call: Blood sugar readings: 147, 207, 158, 245, 197, 198.  Patient takes 143units daily of Lantus.  She mentioned steroid injections and wanted to know if Dr. Dayton Martes did these types of injections.  Advised patient that Dr. Dayton Martes usually doesn't do the steroid injections but Dr. Patsy Lager does here in the office and I doubt Dr. Dayton Martes would mind referring her to see Dr. Patsy Lager if Dr. Dayton Martes felt injections is what she needed.  Please advise. Initial call taken by: Linde Gillis CMA Duncan Dull),  August 31, 2009 2:47 PM  Follow-up for Phone Call        yes, please refer to [facility] if she is interested.   Follow-up by: Ruthe Mannan MD,  August 31, 2009 2:51 PM  Additional Follow-up for Phone Call Additional follow up Details #1::        Appt made for 09/29/2009 at 10:00 to see Dr. Patsy Lager.  Advised patient no changes were made on her insulin dose.   Additional Follow-up by: Linde Gillis CMA Duncan Dull),  August 31, 2009 3:01 PM

## 2010-07-05 NOTE — Letter (Signed)
[facility]   Imported By: Beau Fanny 01/25/2010 14:05:58  _____________________________________________________________________  External Attachment:    Type:   Image     Comment:   External Document

## 2010-07-05 NOTE — Progress Notes (Signed)
Summary: regarding lantus  Phone Note Call from Patient Call back at Home Phone [phone]   Caller: Patient Summary of Call: Pt says she is now taking  100 units of lantus at night and needs new script [facility] [place].  She says her blood sugars are still running in the high 200's. Initial call taken by: Lowella Petties CMA,  July 13, 2009 12:55 PM  Follow-up for Phone Call        Increase another 5 units nightly, prescription sent. Follow-up by: Ruthe Mannan MD,  July 13, 2009 1:01 PM  Additional Follow-up for Phone Call Additional follow up Details #1::        Patient Advised.  Additional Follow-up by: Delilah Shan CMA (AAMA),  July 13, 2009 3:32 PM    New/Updated Medications: LANTUS SOLOSTAR 100 UNIT/ML  SOLN (INSULIN GLARGINE) 105 units at bedtime Prescriptions: LANTUS SOLOSTAR 100 UNIT/ML  SOLN (INSULIN GLARGINE) 105 units at bedtime  #1 x 6   Entered and Authorized by:   Ruthe Mannan MD   Signed by:   Ruthe Mannan MD on 07/13/2009   Method used:   Electronically to        [facility] [address]* (retail)       [address]       [place], [place]  [address]       Ph: [phone]       Fax: [phone]   RxID:   [id number]

## 2010-07-05 NOTE — Assessment & Plan Note (Signed)
Summary: F/U [facility] ER ON 01/24/10/CLE   Vital Signs:  Patient profile:   65 year old female Height:      66 inches Weight:      176.13 pounds BMI:     28.53 Temp:     97.9 degrees F oral Pulse rate:   80 / minute Pulse rhythm:   regular BP sitting:   138 / 72  (right arm) Cuff size:   regular  Vitals Entered By: Linde Gillis CMA Duncan Dull) (January 25, 2010 10:51 AM) CC: ER follow up   History of Present Illness: 65 yo here for ER follow up.  Had thyroidectomy and partial parathyroidectomy on 01/18/2010.  Per pt (have not yet received notes), path was benign.  Was doing ok until a few days ago when she developped SOB, chest pressure and shakiness. CBGs have been good.  At [facility] (awaiting records), pt had a CT of her chest- negative for PE but per pt, showed a 4 mm lung nodule and pericardial effusion. [name] still feels very short of breath today.  [name] is very conerned about this lung nodule.  Her dad died of mesothelioma. Chest CT from 02/2009 was negative for any nodules,  masses, or effusions.  Current Medications (verified): 1)  Bufferin 325 Mg Tabs (Aspirin Buf(Cacarb-Mgcarb-Mgo)) .Marland Kitchen.. 1 By Mouth Daily 2)  Plavix 75 Mg  Tabs (Clopidogrel Bisulfate) .... Take 1 Tablet By Mouth Once A Day 3)  Nitrolingual 0.4 Mg/spray Tl Soln (Nitroglycerin) .... Use As Needed For Chest Pain. 4)  Accu-Chek Aviva  Strp (Glucose Blood) .... Check Blood Sugar Twice A Day 5)  Fish Oil   Oil (Fish Oil) .... 1000mg  Two Times A Day 6)  Fluoxetine Hcl 40 Mg  Caps (Fluoxetine Hcl) .Marland Kitchen.. 1 By Mouth Once Daily 7)  Trazodone Hcl 100 Mg Tabs (Trazodone Hcl) .... 2-3 Tabs At Bedtime For Insomnia 8)  Lansoprazole 30 Mg Cpdr (Lansoprazole) .... Take 1 Tablet By Mouth Once Daily 9)  Caltrate 600+d   Tabs (Calcium Carbonate-Vitamin D Tabs) .Marland Kitchen.. 1 Daily 10)  Claritin-D 24 Hour 10-240 Mg Xr24h-Tab (Loratadine-Pseudoephedrine) .... Take 1 Tablet By Mouth Once A Day 11)  Synthroid 88 Mcg Tabs (Levothyroxine  Sodium) .... Take One By Mouth Daily 12)  Lantus Solostar 100 Unit/ml  Soln (Insulin Glargine) .... 60 Units, Two Times A Day 123 Units, 21 Day Supply 13)  Apidra 100 Unit/ml Soln (Insulin Glulisine) .... 28 Units Two Times A Day  Allergies: 1)  ! Codeine Sulfate (Codeine Sulfate) 2)  ! Percocet 3)  ! Wellbutrin 4)  ! Demerol (Meperidine Hcl) 5)  ! Neurontin 6)  ! Zyprexa 7)  ! Mirtazapine 8)  ! Darvocet-N 100 9)  ! Lipitor 10)  ! Morphine Sulfate Cr (Morphine Sulfate) 11)  ! Crestor (Rosuvastatin Calcium) 12)  ! Lisinopril (Lisinopril) 13)  ! Nasonex (Mometasone Furoate) 14)  ! Zolpidem Tartrate (Zolpidem Tartrate) 15)  * Meperidine & Related Group 16)  Riomet (Metformin Hcl) 17)  Effexor  Past History:  Past Medical History: Last updated: 01/28/2009 Allergic rhinitis Diabetes mellitus, type II 2/04 Diverticulosis, colon GERD Depression--chronic Hypercholesterolemia    --intolerant of statins and niaspan Sleep apnea-mild    --PSG 12/09/07 RDI 12 Coronary artery disease s/p stent ACE Inhibitor induced cough  Past Surgical History: Last updated: 12/24/2008 Cholecystectomy 1993 Colectomy Lap.sigmoid 1/04 Hysterectomy 1976 Tonsillectomy 1957 Bartholin cyst Tubal ligation Bladder suspension Breast cyst removed x 3 Vaginal deliveries x 3 Stress ntivuew-basically neg EF 65% Diverticulitis 11/00  Pneumonia  3/09 Stents x 2--10/2007---PROMUS (RCA, LCX)  Family History: Last updated: 2009/02/24 Mom died of breast cancer (also 2 aunts) Dad with HTN, died of mesothelioma Brother died in MVA  Social History: Last updated: 06/23/2008 Married---3 children Malen Gauze parent but no children lately Stopped smoking around 9/09 No alcohol  Risk Factors: Smoking Status: current (09/09/2006) Packs/Day: 1/2 (12/09/2007)  Review of Systems      See HPI General:  Complains of malaise; denies fever. Eyes:  Denies blurring. ENT:  Denies difficulty swallowing. CV:   Complains of chest pain or discomfort; denies leg cramps with exertion, palpitations, swelling of feet, and swelling of hands. Resp:  Complains of shortness of breath; denies cough and wheezing. GI:  Denies abdominal pain, nausea, and vomiting. MS:  Denies joint pain, joint redness, and joint swelling. Derm:  Denies rash. Neuro:  Denies headaches. Psych:  Denies anxiety and depression. Endo:  Denies excessive thirst and excessive urination. Heme:  Denies abnormal bruising and bleeding.  Physical Exam  General:  alert, appears fatigued, no increased WOB Head:  normocephalic and atraumatic.   Eyes:  vision grossly intact and pupils equal.   Ears:  R ear normal and L ear normal.   Nose:  no external deformity.   Mouth:  dry MM Neck:  surgical incision c/d/i, sutures remain in place Chest Wall:  No deformities, masses, or tenderness noted. Lungs:  Normal respiratory effort, chest expands symmetrically. Lungs are clear to auscultation, no crackles or wheezes. Heart:  Normal rate and regular rhythm. S1 and S2 normal without gallop, murmur, click, rub or other extra sounds. Abdomen:  Bowel sounds positive,abdomen soft and non-tender without masses, organomegaly or hernias noted. Msk:  No deformity or scoliosis noted of thoracic or lumbar spine.   Extremities:   trace  left pedal edema and trace right pedal edema.   mild pitting. Neurologic:  No cranial nerve deficits noted. Station and gait are normal. Plantar reflexes are down-going bilaterally. DTRs are symmetrical throughout. Sensory, motor and coordinative functions appear intact. Psych:  normally interactive, good eye contact, not anxious appearing, and dysphoric affect.     Impression & Recommendations:  Problem # 1:  PERICARDIAL EFFUSION (ICD-423.9) Assessment New Sounds small based on report but given that she is still having symptoms, I would feel more comfortable if she had an echo and cardiac evaluation.  Effusion is likely  benign, postoperative.   Less likely malignant due to solitary lung nodule found incidently on CT. Discussed results with Dr. Mariah Milling, he agreed echo was reasonable at this point. Orders: Cardiology Referral (Cardiology)  Problem # 2:  PULMONARY NODULE, SOLITARY (ICD-518.89) Assessment: New Based on report, atelectasis vs solitary nodule.  If cardiac work up negative, will proceed with follow up CT in one month, sooner if pt is uncomfortable with this plan.  Pt is very anxious about this nodule and may want to see Pulmonary sooner.  May be a difficult area to biopsy. Orders: Radiology Referral (Radiology)  Complete Medication List: 1)  Bufferin 325 Mg Tabs (Aspirin buf(cacarb-mgcarb-mgo)) .Marland Kitchen.. 1 by mouth daily 2)  Plavix 75 Mg Tabs (Clopidogrel bisulfate) .... Take 1 tablet by mouth once a day 3)  Nitrolingual 0.4 Mg/spray Tl Soln (Nitroglycerin) .... Use as needed for chest pain. 4)  Accu-chek Aviva Strp (Glucose blood) .... Check blood sugar twice a day 5)  Fish Oil Oil (Fish oil) .... 1000mg  two times a day 6)  Fluoxetine Hcl 40 Mg Caps (Fluoxetine hcl) .Marland Kitchen.. 1 by mouth once  daily 7)  Trazodone Hcl 100 Mg Tabs (Trazodone hcl) .... 2-3 tabs at bedtime for insomnia 8)  Lansoprazole 30 Mg Cpdr (Lansoprazole) .... Take 1 tablet by mouth once daily 9)  Caltrate 600+d Tabs (Calcium carbonate-vitamin d tabs) .Marland Kitchen.. 1 daily 10)  Claritin-d 24 Hour 10-240 Mg Xr24h-tab (Loratadine-pseudoephedrine) .... Take 1 tablet by mouth once a day 11)  Synthroid 88 Mcg Tabs (Levothyroxine sodium) .... Take one by mouth daily 12)  Lantus Solostar 100 Unit/ml Soln (Insulin glargine) .... 60 units, two times a day 123 units, 21 day supply 13)  Apidra 100 Unit/ml Soln (Insulin glulisine) .... 28 units two times a day  Current Allergies (reviewed today): ! CODEINE SULFATE (CODEINE SULFATE) ! PERCOCET ! WELLBUTRIN ! DEMEROL (MEPERIDINE HCL) ! NEURONTIN ! ZYPREXA ! MIRTAZAPINE ! DARVOCET-N 100 ! LIPITOR !  MORPHINE SULFATE CR (MORPHINE SULFATE) ! CRESTOR (ROSUVASTATIN CALCIUM) ! LISINOPRIL (LISINOPRIL) ! NASONEX (MOMETASONE FUROATE) ! ZOLPIDEM TARTRATE (ZOLPIDEM TARTRATE) * MEPERIDINE & RELATED GROUP RIOMET (METFORMIN HCL) EFFEXOR

## 2010-07-05 NOTE — Progress Notes (Signed)
Summary: Blood sugar  Phone Note Call from Patient Call back at Home Phone [phone]   Caller: Patient Call For: Ruthe Mannan MD Summary of Call: pt calling with BS readings, pt's lowest was 270 and highest 430. Please advise, pt know Dr. Dayton Martes is out today. Per Dr. Dayton Martes office note 06/02/2009, pt can increase by 8 units, pt doesn't think 8 units will do any good. Please advise Initial call taken by: Mervin Hack CMA Duncan Dull),  June 07, 2009 10:40 AM  Follow-up for Phone Call        go ahead and increase by 8 units as planned -- and update tomorrow if not improved  sooner-- if fever/ infection or other symptoms  Follow-up by: Judith Part MD,  June 07, 2009 11:23 AM  Additional Follow-up for Phone Call Additional follow up Details #1::        Advised pt. Additional Follow-up by: Lowella Petties CMA,  June 07, 2009 1:13 PM

## 2010-07-05 NOTE — Progress Notes (Signed)
Summary: Blood sugars  Phone Note Call from Patient   Caller: Patient Call For: Ruthe Mannan MD Summary of Call: Patient states she was told to report back on her blood sugars and she says they are still in the 300's to 400's range. Initial call taken by: Delilah Shan CMA Duncan Dull),  June 09, 2009 8:53 AM  Follow-up for Phone Call        Go ahead and increase by 5 Units and call us back tomorrow. Follow-up by: Ruthe Mannan MD,  June 09, 2009 8:56 AM  Additional Follow-up for Phone Call Additional follow up Details #1::        Patient Advised.  Additional Follow-up by: Delilah Shan CMA (AAMA),  June 09, 2009 9:20 AM

## 2010-07-05 NOTE — Letter (Signed)
Summary: Dr. Sharin Grave  Dr. Sharin Grave   Imported By: Maryln Gottron 09/29/2009 15:05:14  _____________________________________________________________________  External Attachment:    Type:   Image     Comment:   External Document

## 2010-07-05 NOTE — Miscellaneous (Signed)
Summary: med list update- pen needles  Medications Added NOVOFINE 32G X 6 MM MISC (INSULIN PEN NEEDLE) to use twice daily with byetta       Clinical Lists Changes  Medications: Added new medication of NOVOFINE 32G X 6 MM MISC (INSULIN PEN NEEDLE) to use twice daily with byetta     Prior Medications: BUFFERIN 325 MG TABS (ASPIRIN BUF(CACARB-MGCARB-MGO)) 1 by mouth daily PLAVIX 75 MG  TABS (CLOPIDOGREL BISULFATE) Take 1 tablet by mouth once a day NITROLINGUAL 0.4 MG/SPRAY TL SOLN (NITROGLYCERIN) Use as needed for chest pain. ACCU-CHEK AVIVA  STRP (GLUCOSE BLOOD) check blood sugar twice a day FISH OIL   OIL (FISH OIL) 1000mg  two times a day FLUOXETINE HCL 40 MG  CAPS (FLUOXETINE HCL) 1 by mouth once daily TRAZODONE HCL 100 MG TABS (TRAZODONE HCL) 2-3 tabs at bedtime for insomnia LANSOPRAZOLE 30 MG CPDR (LANSOPRAZOLE) take 1 tablet by mouth once daily CALTRATE 600+D   TABS (CALCIUM CARBONATE-VITAMIN D TABS) 1 daily CLARITIN-D 24 HOUR 10-240 MG XR24H-TAB (LORATADINE-PSEUDOEPHEDRINE) Take 1 tablet by mouth once a day SYNTHROID 88 MCG TABS (LEVOTHYROXINE SODIUM) take one by mouth daily LANTUS SOLOSTAR 100 UNIT/ML  SOLN (INSULIN GLARGINE) 60 units, two times a day 123 units, 21 day supply APIDRA 100 UNIT/ML SOLN (INSULIN GLULISINE) 28 units two times a day NOVOFINE 32G X 6 MM MISC (INSULIN PEN NEEDLE) to use twice daily with byetta Current Allergies: ! CODEINE SULFATE (CODEINE SULFATE) ! PERCOCET ! WELLBUTRIN ! DEMEROL (MEPERIDINE HCL) ! NEURONTIN ! ZYPREXA ! MIRTAZAPINE ! DARVOCET-N 100 ! LIPITOR ! MORPHINE SULFATE CR (MORPHINE SULFATE) ! CRESTOR (ROSUVASTATIN CALCIUM) ! LISINOPRIL (LISINOPRIL) ! NASONEX (MOMETASONE FUROATE) ! ZOLPIDEM TARTRATE (ZOLPIDEM TARTRATE) * MEPERIDINE & RELATED GROUP RIOMET (METFORMIN HCL) EFFEXOR

## 2010-07-05 NOTE — Op Note (Signed)
Summary: Thyroidectomy/ [facility]  [facility]   Imported By: Lanelle Bal 02/07/2010 08:35:07  _____________________________________________________________________  External Attachment:    Type:   Image     Comment:   External Document

## 2010-07-05 NOTE — Assessment & Plan Note (Signed)
Summary: ?UTI and insomnia   Vital Signs:  Patient profile:   65 year old female Height:      66 inches Weight:      178 pounds BMI:     28.83 Temp:     97.9 degrees F oral Pulse rate:   68 / minute Pulse rhythm:   regular BP sitting:   110 / 70  (left arm) Cuff size:   regular  Vitals Entered By: Linde Gillis CMA Duncan Dull) (November 11, 2009 8:59 AM) CC: ?UTI and insomnia   History of Present Illness: 65 yo here for ?UTI and insomnia.  UTI- just finished course of Macrobid prescribed by her endocrinologist for UTI.  Still having some mild dysuria and increased urinary frequency.  Wants to make sure it's cleared up. No nausea, vomiting, fevers, or chills.  Insomnia- going on a cruise for her daughter's wedding next week.  Can't sleep.  Does not feel anxious really, but just feels irritable.  Taking Trazadone 200-300 mg nightly for sleep.  It does help at times, but lately she is just "cranky."  Has never felt "normal" since she was diagnosed with diabetes.  Having a hard time adjusting to a chronic illness, especially one where she has to check her sugars three times a day.    Current Medications (verified): 1)  Bufferin 325 Mg Tabs (Aspirin Buf(Cacarb-Mgcarb-Mgo)) .Marland Kitchen.. 1 By Mouth Daily 2)  Plavix 75 Mg  Tabs (Clopidogrel Bisulfate) .... Take 1 Tablet By Mouth Once A Day 3)  Nitrolingual 0.4 Mg/spray Tl Soln (Nitroglycerin) .... Use As Needed For Chest Pain. 4)  Accu-Chek Aviva  Strp (Glucose Blood) .... Check Blood Sugar Twice A Day 5)  Fish Oil   Oil (Fish Oil) .... 1000mg  Two Times A Day 6)  Fluoxetine Hcl 40 Mg  Caps (Fluoxetine Hcl) .Marland Kitchen.. 1 By Mouth Once Daily 7)  Trazodone Hcl 100 Mg Tabs (Trazodone Hcl) .... 2-3 Tabs At Bedtime For Insomnia 8)  Lansoprazole 30 Mg Cpdr (Lansoprazole) .... Take 1 Tablet By Mouth Once Daily 9)  Caltrate 600+d   Tabs (Calcium Carbonate-Vitamin D Tabs) .Marland Kitchen.. 1 Daily 10)  Claritin-D 24 Hour 10-240 Mg Xr24h-Tab (Loratadine-Pseudoephedrine) .... Take 1  Tablet By Mouth Once A Day 11)  Synthroid 88 Mcg Tabs (Levothyroxine Sodium) .... Take One By Mouth Daily 12)  Hydrochlorothiazide 25 Mg Tabs (Hydrochlorothiazide) .... Take 1 Tablet By Mouth Once A Day As Needed 13)  Lantus Solostar 100 Unit/ml  Soln (Insulin Glargine) .... 60 Units, Two Times A Day 123 Units, 21 Day Supply 14)  Apidra 100 Unit/ml Soln (Insulin Glulisine) .... 28 Units Two Times A Day 15)  Cipro 500 Mg Tabs (Ciprofloxacin Hcl) .Marland Kitchen.. 1 By Mouth 2 Times Daily X 3 Days  Allergies: 1)  ! Codeine Sulfate (Codeine Sulfate) 2)  ! Percocet 3)  ! Wellbutrin 4)  ! Demerol (Meperidine Hcl) 5)  ! Neurontin 6)  ! Zyprexa 7)  ! Mirtazapine 8)  ! Darvocet-N 100 9)  ! Lipitor 10)  ! Morphine Sulfate Cr (Morphine Sulfate) 11)  ! Crestor (Rosuvastatin Calcium) 12)  ! Lisinopril (Lisinopril) 13)  ! Nasonex (Mometasone Furoate) 14)  ! Zolpidem Tartrate (Zolpidem Tartrate) 15)  * Meperidine & Related Group 16)  Riomet (Metformin Hcl) 17)  Effexor  Review of Systems      See HPI General:  Denies chills and fever. CV:  Denies chest pain or discomfort. GI:  Denies abdominal pain, change in bowel habits, nausea, and vomiting. GU:  Complains  of dysuria and urinary frequency. Psych:  Complains of depression and irritability; denies anxiety, easily angered, easily tearful, suicidal thoughts/plans, thoughts of violence, unusual visions or sounds, and thoughts /plans of harming others.  Physical Exam  General:  Well-developed,well-nourished,in no acute distress; alert,appropriate and cooperative throughout examination Mouth:  MMM Lungs:  Normal respiratory effort, chest expands symmetrically. Lungs are clear to auscultation, no crackles or wheezes. Heart:  Normal rate and regular rhythm. S1 and S2 normal without gallop, murmur, click, rub or other extra sounds. Abdomen:  NO CVA tenderness Mild suprapubic tenderness Extremities:   trace  left pedal edema and trace right pedal edema.    mild pitting. Psych:  normally interactive, good eye contact, not anxious appearing, and dysphoric affect.     Impression & Recommendations:  Problem # 1:  DYSURIA (ICD-788.1) Assessment New UA still showing LE. Will treat with short course of Cipro since she is going on a cruise in the next few days. Send urine for culture to verify infection. Her updated medication list for this problem includes:    Cipro 500 Mg Tabs (Ciprofloxacin hcl) .Marland Kitchen... 1 by mouth 2 times daily x 3 days  Orders: UA Dipstick w/o Micro (automated)  (81003) T-Culture, Urine ([id number]) Specimen Handling ([id number]) Prescription Created Electronically [id number])  Problem # 2:  DEPRESSION, CHRONIC (ICD-311) Assessment: Deteriorated Time spent with patient 25 minutes, more than 50% of this time was spent counseling patient on her new depressive symptoms. I do feel she would benefit from psychotherapy.  She does not want to change antidepressants, which I agree with.  She will call us back when she gets back from the cruise.  If she still feels down, we will schedule appt for her to see Dr. Charlynn Grimes.  Her updated medication list for this problem includes:    Fluoxetine Hcl 40 Mg Caps (Fluoxetine hcl) .Marland Kitchen... 1 by mouth once daily    Trazodone Hcl 100 Mg Tabs (Trazodone hcl) .Marland Kitchen... 2-3 tabs at bedtime for insomnia  Complete Medication List: 1)  Bufferin 325 Mg Tabs (Aspirin buf(cacarb-mgcarb-mgo)) .Marland Kitchen.. 1 by mouth daily 2)  Plavix 75 Mg Tabs (Clopidogrel bisulfate) .... Take 1 tablet by mouth once a day 3)  Nitrolingual 0.4 Mg/spray Tl Soln (Nitroglycerin) .... Use as needed for chest pain. 4)  Accu-chek Aviva Strp (Glucose blood) .... Check blood sugar twice a day 5)  Fish Oil Oil (Fish oil) .... 1000mg  two times a day 6)  Fluoxetine Hcl 40 Mg Caps (Fluoxetine hcl) .Marland Kitchen.. 1 by mouth once daily 7)  Trazodone Hcl 100 Mg Tabs (Trazodone hcl) .... 2-3 tabs at bedtime for insomnia 8)  Lansoprazole 30 Mg Cpdr (Lansoprazole) ....  Take 1 tablet by mouth once daily 9)  Caltrate 600+d Tabs (Calcium carbonate-vitamin d tabs) .Marland Kitchen.. 1 daily 10)  Claritin-d 24 Hour 10-240 Mg Xr24h-tab (Loratadine-pseudoephedrine) .... Take 1 tablet by mouth once a day 11)  Synthroid 88 Mcg Tabs (Levothyroxine sodium) .... Take one by mouth daily 12)  Hydrochlorothiazide 25 Mg Tabs (Hydrochlorothiazide) .... Take 1 tablet by mouth once a day as needed 13)  Lantus Solostar 100 Unit/ml Soln (Insulin glargine) .... 60 units, two times a day 123 units, 21 day supply 14)  Apidra 100 Unit/ml Soln (Insulin glulisine) .... 28 units two times a day 15)  Cipro 500 Mg Tabs (Ciprofloxacin hcl) .Marland Kitchen.. 1 by mouth 2 times daily x 3 days Prescriptions: LANSOPRAZOLE 30 MG CPDR (LANSOPRAZOLE) take 1 tablet by mouth once daily  #90 x 3   Entered  and Authorized by:   Ruthe Mannan MD   Signed by:   Ruthe Mannan MD on 11/11/2009   Method used:   Electronically to        [facility] [address] (retail)       [address]       [place], [place]  [address]       Ph: [phone]       Fax: [phone]   RxID:   [id number] CIPRO 500 MG TABS (CIPROFLOXACIN HCL) 1 by mouth 2 times daily x 3 days  #6 x 0   Entered and Authorized by:   Ruthe Mannan MD   Signed by:   Ruthe Mannan MD on 11/11/2009   Method used:   Electronically to        [facility] [address] (retail)       [address]       [place], [place]  [address]       Ph: [phone]       Fax: [phone]   RxID:   [id number]   Current Allergies (reviewed today): ! CODEINE SULFATE (CODEINE SULFATE) ! PERCOCET ! WELLBUTRIN ! DEMEROL (MEPERIDINE HCL) ! NEURONTIN ! ZYPREXA ! MIRTAZAPINE ! DARVOCET-N 100 ! LIPITOR ! MORPHINE SULFATE CR (MORPHINE SULFATE) ! CRESTOR (ROSUVASTATIN CALCIUM) ! LISINOPRIL (LISINOPRIL) ! NASONEX (MOMETASONE FUROATE) ! ZOLPIDEM TARTRATE (ZOLPIDEM TARTRATE) * MEPERIDINE & RELATED GROUP RIOMET (METFORMIN HCL) EFFEXOR  Rx to [facility] for Prevacid was  cancelled per patient's request.  Linde Gillis CMA Duncan Dull)  November 11, 2009 9:36 AM   Laboratory Results   Urine Tests  Date/Time Received: November 11, 2009 9:20 AM   Routine Urinalysis   Color: yellow Appearance: Cloudy Glucose: negative   (Normal Range: Negative) Bilirubin: negative   (Normal Range: Negative) Ketone: negative   (Normal Range: Negative) Spec. Gravity: 1.015   (Normal Range: 1.003-1.035) Blood: negative   (Normal Range: Negative) pH: 5.0   (Normal Range: 5.0-8.0) Protein: negative   (Normal Range: Negative) Urobilinogen: 0.2   (Normal Range: 0-1) Nitrite: negative   (Normal Range: Negative) Leukocyte Esterace: trace   (Normal Range: Negative)

## 2010-07-05 NOTE — Progress Notes (Signed)
Summary: blood sugar  Phone Note Call from Patient Call back at Home Phone [phone]   Caller: Patient Call For: Ruthe Mannan MD Summary of Call: Patient called to let you know that she is taking 137 units and her sugar this morning after eating a gravy biscuit was 239. She says that it has came down from the high 200's to low 200's with increasing the insulin. Pleas advise.  Initial call taken by: Melody Comas,  August 17, 2009 10:04 AM  Follow-up for Phone Call        If she just increased to 137 then continue this dose.  If it has been more than a week, she can increase to 140. Follow-up by: Ruthe Mannan MD,  August 17, 2009 10:17 AM  Additional Follow-up for Phone Call Additional follow up Details #1::        She increased on 08/11/09 at your request from her lab results.  Move up to 140 or stay at 137? Lugene Fuquay CMA Duncan Dull)  August 17, 2009 10:29 AM     Additional Follow-up for Phone Call Additional follow up Details #2::    Yes ok to increase to 140. Follow-up by: Ruthe Mannan MD,  August 17, 2009 10:30 AM  Additional Follow-up for Phone Call Additional follow up Details #3:: Details for Additional Follow-up Action Taken: Patient Advised.  Additional Follow-up by: Delilah Shan CMA (AAMA),  August 17, 2009 10:30 AM

## 2010-07-05 NOTE — Assessment & Plan Note (Signed)
Summary: WHEEZING, COUGH   Vital Signs:  Patient profile:   65 year old female Height:      66 inches Weight:      182 pounds BMI:     29.48 O2 Sat:      97 % on Room air Temp:     97.1 degrees F oral Pulse rate:   74 / minute Pulse rhythm:   regular BP sitting:   130 / 72  (right arm) Cuff size:   regular  Vitals Entered By: Linde Gillis CMA Duncan Dull) (April 25, 2010 8:01 AM)  O2 Flow:  Room air CC: wheezing   History of Present Illness: CC:  wheezing  Her for worseinng cough and wheezing. Saw Dr. Reece Agar on 11/14, given albuterol.   Says that is not helping. Cough is now productive, still feels feverish (subjective). Thanksgiving holiday in 2 days and worried she will have to go to ED over the weekend. No CP.   Husband smokes outside.  Pt quit 2 1/2 yrs ago.  h/o OSA, not on anything.  has tried CPAP x 2, didn't like.  told may have mild COPD.  takes claritin D, takes allergy shots.    Still has not made appt with pulmonary for evaluation.  Current Medications (verified): 1)  Bufferin 325 Mg Tabs (Aspirin Buf(Cacarb-Mgcarb-Mgo)) .Marland Kitchen.. 1 By Mouth Daily 2)  Plavix 75 Mg  Tabs (Clopidogrel Bisulfate) .... Take 1 Tablet By Mouth Once A Day 3)  Nitrolingual 0.4 Mg/spray Tl Soln (Nitroglycerin) .... Use As Needed For Chest Pain. 4)  Accu-Chek Aviva  Strp (Glucose Blood) .... Check Blood Sugar Twice A Day 5)  Fluoxetine Hcl 40 Mg  Caps (Fluoxetine Hcl) .Marland Kitchen.. 1 By Mouth Once Daily 6)  Trazodone Hcl 100 Mg Tabs (Trazodone Hcl) .... 2-3 Tabs At Bedtime For Insomnia 7)  Lansoprazole 30 Mg Cpdr (Lansoprazole) .... Take 1 Tablet By Mouth Once Daily 8)  Caltrate 600+d   Tabs (Calcium Carbonate-Vitamin D Tabs) .Marland Kitchen.. 1 Daily 9)  Synthroid 125 Mcg Tabs (Levothyroxine Sodium) .... Take One Tablet By Mouth Daily 10)  Lantus Solostar 100 Unit/ml  Soln (Insulin Glargine) .... 65 Units Two Times A Day 11)  Apidra 100 Unit/ml Soln (Insulin Glulisine) .... 28 Units Two Times A Day 12)  Onetouch  Ultra Blue  Strp (Glucose Blood) .... Use As Directed. 13)  Triamcinolone Acetonide 0.1 % Oint (Triamcinolone Acetonide) .... Apply As Directed 14)  Ventolin Hfa 108 (90 Base) Mcg/act Aers (Albuterol Sulfate) .Marland Kitchen.. 1-2 Puffs Q4-6 Hours As Needed Wheezing 15)  Azithromycin 250 Mg  Tabs (Azithromycin) .... 2 By  Mouth Today and Then 1 Daily For 4 Days  Allergies: 1)  ! Codeine Sulfate (Codeine Sulfate) 2)  ! Percocet 3)  ! Wellbutrin 4)  ! Demerol (Meperidine Hcl) 5)  ! Neurontin 6)  ! Zyprexa 7)  ! Mirtazapine 8)  ! Darvocet-N 100 9)  ! Lipitor 10)  ! Morphine Sulfate Cr (Morphine Sulfate) 11)  ! Crestor (Rosuvastatin Calcium) 12)  ! Lisinopril (Lisinopril) 13)  ! Nasonex (Mometasone Furoate) 14)  ! Zolpidem Tartrate (Zolpidem Tartrate) 15)  * Meperidine & Related Group 16)  Riomet (Metformin Hcl) 17)  Effexor  Past History:  Past Medical History: Last updated: 01/28/2009 Allergic rhinitis Diabetes mellitus, type II 2/04 Diverticulosis, colon GERD Depression--chronic Hypercholesterolemia    --intolerant of statins and niaspan Sleep apnea-mild    --PSG 12/09/07 RDI 12 Coronary artery disease s/p stent ACE Inhibitor induced cough  Past Surgical History: Last updated:  02/03/2010 Cholecystectomy 1993 Colectomy Lap.sigmoid 1/04 Hysterectomy 1976 Tonsillectomy 1957 Bartholin cyst Tubal ligation Bladder suspension Breast cyst removed x 3 Vaginal deliveries x 3 Stress ntivuew-basically neg EF 65% Diverticulitis 11/00 Pneumonia  3/09 Stents x 2--10/2007---PROMUS (RCA, LCX) thyroidectomy 01/2010  Family History: Last updated: 03-18-09 Mom died of breast cancer (also 2 aunts) Dad with HTN, died of mesothelioma Brother died in MVA  Social History: Last updated: 06/23/2008 Married---3 children Malen Gauze parent but no children lately Stopped smoking around 9/09 No alcohol  Risk Factors: Smoking Status: current (09/09/2006) Packs/Day: 1/2 (12/09/2007)  Review of  Systems      See HPI General:  Complains of chills and fever. Resp:  Complains of shortness of breath, sputum productive, and wheezing.  Physical Exam  General:  WDWN, NAD O2 97% Mouth:  MMM, no pharyngeal erythema Lungs:  Normal respiratory effort, chest expands symmetrically. Lungs are clear to auscultation, no crackles or wheezes.  slightly coarse Heart:  Normal rate and regular rhythm. S1 and S2 normal without gallop, murmur, click, rub or other extra sounds. Extremities:  trace  left pedal edema and trace right pedal edema. mild pitting.  no calf tenderness Psych:  normally interactive, good eye contact, not anxious appearing, and dysphoric affect.     Impression & Recommendations:  Problem # 1:  COUGH (ICD-786.2) Assessment Deteriorated  Given durationa and progression of symptoms, will treat for bacterial bronchitis with Zpack. Encouraged her to talk to her husband about smoking cessation. Also needs to make appt with pulmonary.  Orders: Prescription Created Electronically [phone])  Complete Medication List: 1)  Bufferin 325 Mg Tabs (Aspirin buf(cacarb-mgcarb-mgo)) .Marland Kitchen.. 1 by mouth daily 2)  Plavix 75 Mg Tabs (Clopidogrel bisulfate) .... Take 1 tablet by mouth once a day 3)  Nitrolingual 0.4 Mg/spray Tl Soln (Nitroglycerin) .... Use as needed for chest pain. 4)  Accu-chek Aviva Strp (Glucose blood) .... Check blood sugar twice a day 5)  Fluoxetine Hcl 40 Mg Caps (Fluoxetine hcl) .Marland Kitchen.. 1 by mouth once daily 6)  Trazodone Hcl 100 Mg Tabs (Trazodone hcl) .... 2-3 tabs at bedtime for insomnia 7)  Lansoprazole 30 Mg Cpdr (Lansoprazole) .... Take 1 tablet by mouth once daily 8)  Caltrate 600+d Tabs (Calcium carbonate-vitamin d tabs) .Marland Kitchen.. 1 daily 9)  Synthroid 125 Mcg Tabs (Levothyroxine sodium) .... Take one tablet by mouth daily 10)  Lantus Solostar 100 Unit/ml Soln (Insulin glargine) .... 65 units two times a day 11)  Apidra 100 Unit/ml Soln (Insulin glulisine) .... 28 units two  times a day 12)  Onetouch Ultra Blue Strp (Glucose blood) .... Use as directed. 13)  Triamcinolone Acetonide 0.1 % Oint (Triamcinolone acetonide) .... Apply as directed 14)  Ventolin Hfa 108 (90 Base) Mcg/act Aers (Albuterol sulfate) .Marland Kitchen.. 1-2 puffs q4-6 hours as needed wheezing 15)  Azithromycin 250 Mg Tabs (Azithromycin) .... 2 by  mouth today and then 1 daily for 4 days Prescriptions: AZITHROMYCIN 250 MG  TABS (AZITHROMYCIN) 2 by  mouth today and then 1 daily for 4 days  #6 x 0   Entered and Authorized by:   Ruthe Mannan MD   Signed by:   Ruthe Mannan MD on 04/25/2010   Method used:   Print then Give to Patient   RxID:   [id number] LANTUS SOLOSTAR 100 UNIT/ML  SOLN (INSULIN GLARGINE) 65 units two times a day  #8 boxes x 3    Entered by:   Linde Gillis CMA (AAMA)   Authorized by:   Ruthe Mannan MD  Signed by:   Linde Gillis CMA (AAMA) on 04/25/2010   Method used:   Print then Give to Patient   RxID:   [id number]    Orders Added: 1)  Est. Patient Level IV [14782] 2)  Prescription Created Electronically [phone]    Current Allergies (reviewed today): ! CODEINE SULFATE (CODEINE SULFATE) ! PERCOCET ! WELLBUTRIN ! DEMEROL (MEPERIDINE HCL) ! NEURONTIN ! ZYPREXA ! MIRTAZAPINE ! DARVOCET-N 100 ! LIPITOR ! MORPHINE SULFATE CR (MORPHINE SULFATE) ! CRESTOR (ROSUVASTATIN CALCIUM) ! LISINOPRIL (LISINOPRIL) ! NASONEX (MOMETASONE FUROATE) ! ZOLPIDEM TARTRATE (ZOLPIDEM TARTRATE) * MEPERIDINE & RELATED GROUP RIOMET (METFORMIN HCL) EFFEXOR

## 2010-07-05 NOTE — Assessment & Plan Note (Signed)
Summary: 11:45 APPT/AMD      Allergies Added:   Visit Type:  Follow-up Referring Provider:  Dr. Gala Romney Primary Provider:  Dr. Alphonsus Sias  CC:  c/o shortness of breath and very weak.  Had a chest CT at [facility] that showed fluid around heart with heart slightly enlarged with 4mm nodule on left lung.Marland Kitchen  History of Present Illness: [name] is a 65 year old woman with a history of hypertension, hyperlipidemia, diabetes, COPD, chronic cough and coronary artery disease, status post two-vessel angioplasty with Promus drug-eluting stents back in May 2009. Also has chronic CP and SOB.   Myoview 01/2009 EF 72% normal perfusion  Echo 02/02/2010: EF 55-60% grade I diastoilic dysfunction. no sig valve problems.   Underwent thyroidectomy for multinodular goiter on August 17. Felt good for a day then started to feel bad.  Last week was feeling more SOB. Went to ER. Had CT chest. No pulmonary embolus. Small pericardial effusion. 4mm nodule. Saw Dr. Dayton Martes the day after who set he up for echo yesterday and scheduled f/u CT.  Presents today says she feels totally wiped out. Very short of breath. No wheezing. Feels hot and cold but not frank fever. No edema. No CP, orthopnea, PND.     Current Medications (verified): 1)  Bufferin 325 Mg Tabs (Aspirin Buf(Cacarb-Mgcarb-Mgo)) .Marland Kitchen.. 1 By Mouth Daily 2)  Plavix 75 Mg  Tabs (Clopidogrel Bisulfate) .... Take 1 Tablet By Mouth Once A Day 3)  Nitrolingual 0.4 Mg/spray Tl Soln (Nitroglycerin) .... Use As Needed For Chest Pain. 4)  Accu-Chek Aviva  Strp (Glucose Blood) .... Check Blood Sugar Twice A Day 5)  Fish Oil   Oil (Fish Oil) .... 1000mg  Two Times A Day 6)  Fluoxetine Hcl 40 Mg  Caps (Fluoxetine Hcl) .Marland Kitchen.. 1 By Mouth Once Daily 7)  Trazodone Hcl 100 Mg Tabs (Trazodone Hcl) .... 2-3 Tabs At Bedtime For Insomnia 8)  Lansoprazole 30 Mg Cpdr (Lansoprazole) .... Take 1 Tablet By Mouth Once Daily 9)  Caltrate 600+d   Tabs (Calcium Carbonate-Vitamin D Tabs) .Marland Kitchen.. 1  Daily 10)  Claritin-D 24 Hour 10-240 Mg Xr24h-Tab (Loratadine-Pseudoephedrine) .... Take 1 Tablet By Mouth Once A Day 11)  Synthroid 88 Mcg Tabs (Levothyroxine Sodium) .... Take One By Mouth Daily 12)  Lantus Solostar 100 Unit/ml  Soln (Insulin Glargine) .... 60 Units, Two Times A Day 123 Units, 21 Day Supply 13)  Apidra 100 Unit/ml Soln (Insulin Glulisine) .... 28 Units Two Times A Day 14)  Novofine 32g X 6 Mm Misc (Insulin Pen Needle) .... To Use Twice Daily With Byetta  Allergies (verified): 1)  ! Codeine Sulfate (Codeine Sulfate) 2)  ! Percocet 3)  ! Wellbutrin 4)  ! Demerol (Meperidine Hcl) 5)  ! Neurontin 6)  ! Zyprexa 7)  ! Mirtazapine 8)  ! Darvocet-N 100 9)  ! Lipitor 10)  ! Morphine Sulfate Cr (Morphine Sulfate) 11)  ! Crestor (Rosuvastatin Calcium) 12)  ! Lisinopril (Lisinopril) 13)  ! Nasonex (Mometasone Furoate) 14)  ! Zolpidem Tartrate (Zolpidem Tartrate) 15)  * Meperidine & Related Group 16)  Riomet (Metformin Hcl) 17)  Effexor  Past History:  Past Medical History: Last updated: 01/28/2009 Allergic rhinitis Diabetes mellitus, type II 2/04 Diverticulosis, colon GERD Depression--chronic Hypercholesterolemia    --intolerant of statins and niaspan Sleep apnea-mild    --PSG 12/09/07 RDI 12 Coronary artery disease s/p stent ACE Inhibitor induced cough  Family History: Last updated: 17-Mar-2009 Mom died of breast cancer (also 2 aunts) Dad with HTN, died of  mesothelioma Brother died in MVA  Social History: Last updated: 06/23/2008 Married---3 children Malen Gauze parent but no children lately Stopped smoking around 9/09 No alcohol  Risk Factors: Smoking Status: current (09/09/2006) Packs/Day: 1/2 (12/09/2007)  Past Surgical History: Cholecystectomy 1993 Colectomy Lap.sigmoid 1/04 Hysterectomy 1976 Tonsillectomy 1957 Bartholin cyst Tubal ligation Bladder suspension Breast cyst removed x 3 Vaginal deliveries x 3 Stress ntivuew-basically neg EF  65% Diverticulitis 11/00 Pneumonia  3/09 Stents x 2--10/2007---PROMUS (RCA, LCX) thyroidectomy 01/2010  Review of Systems       As per HPI and past medical history; otherwise all systems negative.   Vital Signs:  Patient profile:   65 year old female Height:      66 inches Weight:      176 pounds BMI:     28.51 Pulse rate:   79 / minute Pulse (ortho):   98 / minute BP sitting:   118 / 78  (right arm) Cuff size:   regular  Vitals Entered By: Bishop Dublin, CMA (February 03, 2010 11:59 AM) CC: c/o shortness of breath and very weak.  Had a chest CT at [facility] that showed fluid around heart with heart slightly enlarged with 4mm nodule on left lung. Comments Pt walked around office o2 sats continued to be 98-99% on RA.   Physical Exam  General:  alert, appears fatigued uncomfortable, no increased WOB. walked around clinic sats 98-99% HEENT: normal Neck: supple. no JVD. Carotids 2+ bilat; no bruits. Thyroid scar well healed. no swelling. Cor: PMI nondisplaced. Regular rate & rhythm. No rubs, gallops, murmur. Lungs: clear Abdomen: soft, nontender, nondistended. No hepatosplenomegaly. No bruits or masses. Good bowel sounds. Extremities: no cyanosis, clubbing, rash. no edema Neuro: alert & orientedx3, cranial nerves grossly intact. moves all 4 extremities w/o difficulty. affect pleasant    Impression & Recommendations:  Problem # 1:  DYSPNEA (ICD-786.05) Unfortunately, I do not have any clear explanation for her dyspnea. Given recent surgery, PE certainly a cosnideration but recent chest CT negative. No evidence of CHF or PNA on exam. Will repeat CXR and labs including CBC, BNP and thyroid panel. If symptoms are worsening may need to consider putting her in the hospital for additional testing and monitoring. Await lab results.   Other Orders: EKG w/ Interpretation (93000) T-2 View CXR (71020TC) T-CBC w/Diff ([id number]) T-Basic Metabolic Panel [id number]) T-BNP  (B Natriuretic  Peptide) [phone]) T-T4, Free [phone]) T-TSH [phone])  Patient Instructions: 1)  Your physician recommends that you schedule a follow-up appointment in: 3 months  2)  Your physician recommends that you have  lab work today (CBC/BMP/BNP/TSH/FREET4) 3)  Your physician recommends that you continue on your current medications as directed. Please refer to the Current Medication list given to you today. 4)  A chest x-ray takes a picture of the organs and structures inside the chest, including the heart, lungs, and blood vessels. This test can show several things, including, whether the heart is enlarged; whether fluid is building up in the lungs; and whether pacemaker / defibrillator leads are still in place.

## 2010-07-05 NOTE — Medication Information (Signed)
Summary: Care Consideration Regarding Fluoxetine & Statin/[facility]  Care Consideration Regarding Fluoxetine & Statin/[facility]   Imported By: Lanelle Bal 10/26/2009 10:12:09  _____________________________________________________________________  External Attachment:    Type:   Image     Comment:   External Document  Appended Document: Care Consideration Regarding Fluoxetine & Statin/[facility] no change needed

## 2010-07-05 NOTE — Progress Notes (Signed)
Summary: reporting blood sugar readings  Phone Note Call from Patient Call back at Home Phone [phone]   Caller: Patient Call For: Ruthe Mannan MD Summary of Call: Pt reports blood sugar readings of 105, 259, 268, 239, 219, 271-  non fasting.  She is taking 140 units of insulin daily.  Please advise. Initial call taken by: Lowella Petties CMA,  August 24, 2009 11:33 AM  Follow-up for Phone Call        [facility] to increase to 143 units, please ask her to take fasting measurements as well.  We don't want to drop her too low. Follow-up by: Ruthe Mannan MD,  August 24, 2009 11:40 AM  Additional Follow-up for Phone Call Additional follow up Details #1::        Patient Advised.  Additional Follow-up by: Delilah Shan CMA Duncan Dull),  August 24, 2009 2:59 PM

## 2010-07-05 NOTE — Progress Notes (Signed)
Summary: RESULTS   Phone Note Call from Patient Call back at Home Phone [phone]   Caller: SELF Call For: BENSIMHON Summary of Call: WOULD LIKE RESULTS OF LABWORK AND XRAY FROM FRIDAY Initial call taken by: Harlon Flor,  February 07, 2010 8:36 AM  Follow-up for Phone Call        Pt called asking for results again. I explained to her that we recieved her lab results today and that Dr. Gala Romney has to look at them due to the holiday he has not gotten to the labs yet. The chest xray has not came back yet will call [facility] to have report faxed. Benedict Needy, RN  February 07, 2010 11:46 AM   Additional Follow-up for Phone Call Additional follow up Details #1::        Dr. Mariah Milling looked at CXR. So far xray and labs are normal. pt is aware Benedict Needy, RN  February 07, 2010 5:39 PM

## 2010-07-05 NOTE — Assessment & Plan Note (Signed)
Summary: 30 MINUTE APPT, REFERRAL BY DR ARON/NT   Vital Signs:  Patient profile:   65 year old female Height:      66 inches Weight:      179.8 pounds BMI:     29.13 Pulse rate:   84 / minute Pulse rhythm:   regular BP sitting:   120 / 70  (left arm) Cuff size:   large  Vitals Entered By: Benny Lennert CMA Duncan Dull) (September 29, 2009 9:54 AM)   History of Present Illness: Chief complaint right thumb wants injection per dr Dayton Martes  65 year old patient with R [facility] OA who presents with flare-up after doing a lot more with her hands this spring.   Currently does take some Tylenol and occ. will take Mobic.  Has some weakness and mild thenar wasting on the R.  Dull ache right at the [facility] joint    Allergies: 1)  ! Codeine Sulfate (Codeine Sulfate) 2)  ! Percocet 3)  ! Wellbutrin 4)  ! Demerol (Meperidine Hcl) 5)  ! Neurontin 6)  ! Zyprexa 7)  ! Mirtazapine 8)  ! Darvocet-N 100 9)  ! Lipitor 10)  ! Morphine Sulfate Cr (Morphine Sulfate) 11)  ! Crestor (Rosuvastatin Calcium) 12)  ! Lisinopril (Lisinopril) 13)  ! Nasonex (Mometasone Furoate) 14)  ! Zolpidem Tartrate (Zolpidem Tartrate) 15)  * Meperidine & Related Group 16)  Riomet (Metformin Hcl) 17)  Effexor  Past History:  Past medical, surgical, family and social histories (including risk factors) reviewed, and no changes noted (except as noted below).  Past Medical History: Reviewed history from 01/28/2009 and no changes required. Allergic rhinitis Diabetes mellitus, type II 2/04 Diverticulosis, colon GERD Depression--chronic Hypercholesterolemia    --intolerant of statins and niaspan Sleep apnea-mild    --PSG 12/09/07 RDI 12 Coronary artery disease s/p stent ACE Inhibitor induced cough  Past Surgical History: Reviewed history from 12/24/2008 and no changes required. Cholecystectomy 1993 Colectomy Lap.sigmoid 1/04 Hysterectomy 1976 Tonsillectomy 1957 Bartholin cyst Tubal ligation Bladder suspension Breast  cyst removed x 3 Vaginal deliveries x 3 Stress ntivuew-basically neg EF 65% Diverticulitis 11/00 Pneumonia  3/09 Stents x 2--10/2007---PROMUS (RCA, LCX)  Family History: Reviewed history from 02/22/2009 and no changes required. Mom died of breast cancer (also 2 aunts) Dad with HTN, died of mesothelioma Brother died in MVA  Social History: Reviewed history from 06/23/2008 and no changes required. Married---3 children Malen Gauze parent but no children lately Stopped smoking around 9/09 No alcohol  Review of Systems       REVIEW OF SYSTEMS  GEN: No systemic complaints, no fevers, chills, sweats, or other acute illnesses MSK: Detailed in the HPI GI: tolerating PO intake without difficulty Neuro: No numbness, parasthesias, or tingling associated. Otherwise the pertinent positives of the ROS are noted above.    Physical Exam  General:  GEN: Well-developed,well-nourished,in no acute distress; alert,appropriate and cooperative throughout examination HEENT: Normocephalic and atraumatic without obvious abnormalities. No apparent alopecia or balding. Ears, externally no deformities PULM: Breathing comfortably in no respiratory distress EXT: No clubbing, cyanosis, or edema PSYCH: Normally interactive. Cooperative during the interview. Pleasant. Friendly and conversant. Not anxious or depressed appearing. Normal, full affect.  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  We discussed treatment strategies including: Tylenol on a routine bases, 2 tablets up to 3-4 times a day Mobic as needed  During an acute flare, intraarticular corticosteroids can be helpful.  Str activity for grip including stress ball daily  CMC joint injection, R Verbal consent was obtained. Risks, benefits, and alternatives were discussed. Prepped with Betadine and Ethyl Chloride used for anesthesia. Under sterile conditions, patient injected into 1st CMC joint at joint line in apex of snuffbox inserting perpendicularly with traction placed on thumb. Aspiration yields no blood. Decreased pain post injection. No complications. Needle size: 25 gauge Injection: 1/2 cc of Marcaine 0.5% and 1/2 cc of Kenalog 40 mg   Her updated medication list for this problem includes:    Bufferin 325 Mg Tabs (Aspirin buf(cacarb-mgcarb-mgo)) .Marland Kitchen... 1 by mouth daily  Orders: Joint Aspirate / Injection, Small (16109) Kenalog 10mg  (up to 2 units) (J3301)  Complete Medication List: 1)  Bufferin 325 Mg Tabs (Aspirin buf(cacarb-mgcarb-mgo)) .Marland Kitchen.. 1 by mouth daily 2)  Plavix 75 Mg Tabs (Clopidogrel bisulfate) .... Take 1 tablet by mouth once a day 3)  Nitrolingual 0.4 Mg/spray Tl Soln (Nitroglycerin) .... Use as needed for chest pain. 4)  Accu-chek Aviva Strp (Glucose blood) .... Check blood sugar twice a day 5)  Fish Oil Oil (Fish oil) .... 1000mg  two times a day 6)  Fluoxetine Hcl 40 Mg Caps (Fluoxetine hcl) .Marland Kitchen.. 1 by mouth once daily 7)  Trazodone Hcl 100 Mg Tabs (Trazodone hcl) .... 2-3 tabs at bedtime for insomnia 8)  Lansoprazole 30 Mg Cpdr (Lansoprazole) .... Take 1 tablet by mouth once daily 9)  Caltrate 600+d Tabs (Calcium  carbonate-vitamin d tabs) .Marland Kitchen.. 1 daily 10)  Claritin-d 24 Hour 10-240 Mg Xr24h-tab (Loratadine-pseudoephedrine) .... Take 1 tablet by mouth once a day 11)  Synthroid 88 Mcg Tabs (Levothyroxine sodium) .... Take one by mouth daily 12)  Hydrochlorothiazide 25 Mg Tabs (Hydrochlorothiazide) .... Take 1 tablet by mouth once a day as needed 13)  Lantus Solostar 100 Unit/ml Soln (Insulin glargine) .... 60 units at two times a day 14)  Apida   Current Allergies (reviewed today): ! CODEINE SULFATE (CODEINE SULFATE) ! PERCOCET ! WELLBUTRIN ! DEMEROL (MEPERIDINE HCL) ! NEURONTIN ! ZYPREXA ! MIRTAZAPINE ! DARVOCET-N 100 ! LIPITOR ! MORPHINE SULFATE CR (MORPHINE SULFATE) ! CRESTOR (ROSUVASTATIN CALCIUM) ! LISINOPRIL (LISINOPRIL) ! NASONEX (MOMETASONE FUROATE) ! ZOLPIDEM TARTRATE (ZOLPIDEM TARTRATE) * MEPERIDINE & RELATED GROUP RIOMET (METFORMIN HCL) EFFEXOR

## 2010-07-05 NOTE — Letter (Signed)
Summary: [facility]   Imported By: Lanelle Bal 11/10/2009 09:30:44  _____________________________________________________________________  External Attachment:    Type:   Image     Comment:   External Document

## 2010-07-05 NOTE — Progress Notes (Signed)
Summary: lantus    Phone Note Refill Request Message from:  Patient on April 14, 2010 10:26 AM  Refills Requested: Medication #1:  LANTUS SOLOSTAR 100 UNIT/ML  SOLN 65 units Patient is requesting a written rx for mail order. She says that it needs to be rewritten so that it comes out ot be an even number. So instead of 65 units it would need to be for 67 units. Patient would like to come in and pick this up when ready.   Initial call taken by: Melody Comas,  April 14, 2010 10:28 AM  Follow-up for Phone Call        I'm not sure I understand this since 3 is not an even number either.  What exactly does she mean? Ruthe Mannan MD  April 14, 2010 10:32 AM  Spoke with patients spouse and it is very confusing the way he explained it to me.  He said that the pharmacy will not break a box in order to fill a Rx order.  The patient can get one or more boxes but not a half of box.  Dr. Dayton Martes is not comfortable writting a Rx that doubles the dose because it would be incorrect and it would cause more confusion for both [place] and the patient.  She agreed to write the Rx for 65 units once daily, 8 boxes with 3 refills.  Linde Gillis CMA Duncan Dull)  April 14, 2010 10:42 AM      New/Updated Medications: LANTUS SOLOSTAR 100 UNIT/ML  SOLN (INSULIN GLARGINE) 65 units daily. Prescriptions: LANTUS SOLOSTAR 100 UNIT/ML  SOLN (INSULIN GLARGINE) 65 units daily.  #8 boxes x 3   Entered and Authorized by:   Ruthe Mannan MD   Signed by:   Ruthe Mannan MD on 04/14/2010   Method used:   Print then Give to Patient   RxID:   [id number]   Appended Document: lantus   Patients spouse advised as instructed via telephone, he will come by to pick up Rx.

## 2010-07-05 NOTE — Miscellaneous (Signed)
Summary: mammo results   Clinical Lists Changes  Observations: Added new observation of MAMMO DUE: 07/2010 (07/04/2009 9:13) Added new observation of MAMMOGRAM: normal (07/04/2009 9:13)      Preventive Care Screening  Mammogram:    Date:  07/04/2009    Next Due:  07/2010    Results:  normal

## 2010-07-06 NOTE — Assessment & Plan Note (Signed)
Summary: DISCUSS DIABETES MEDICATION/CLE   Vital Signs:  Patient profile:   65 year old female Height:      66 inches Weight:      186.50 pounds BMI:     30.21 Temp:     97.9 degrees F oral Pulse rate:   82 / minute Pulse rhythm:   regular BP sitting:   120 / 70  (right arm) Cuff size:   regular  Vitals Entered By: Linde Gillis CMA Duncan Dull) (June 30, 2010 11:50 AM) CC: discuss diabetes medication   History of Present Illness: 65 yo here to discuss DM medications.  was being followed by endocrinology but she no longer wants to see a specialist.  a1c was 7.7 on 06/16/2010 ( 7.5 on 10/10).  currently taking Lantus 68 units twice daily, Aphjidra 28 units twice daily.  CBGs remain elevated.  has been on metformin, gylburide, glipizide all which call GI upset/shakiness. byetta also causes GI upset.  was on actos in past and felt it worked well.    Current Medications (verified): 1)  Bufferin 325 Mg Tabs (Aspirin Buf(Cacarb-Mgcarb-Mgo)) .Marland Kitchen.. 1 By Mouth Daily 2)  Plavix 75 Mg  Tabs (Clopidogrel Bisulfate) .... Take 1 Tablet By Mouth Once A Day 3)  Nitrolingual 0.4 Mg/spray Tl Soln (Nitroglycerin) .... Use As Needed For Chest Pain. 4)  Accu-Chek Aviva  Strp (Glucose Blood) .... Check Blood Sugar Twice A Day 5)  Prozac 40 Mg Caps (Fluoxetine Hcl) .Marland Kitchen.. 1 Tab By Mouth Two Times A Day 6)  Trazodone Hcl 100 Mg Tabs (Trazodone Hcl) .... 2-3 Tabs At Bedtime For Insomnia 7)  Lansoprazole 30 Mg Cpdr (Lansoprazole) .... Take 1 Tablet By Mouth Once Daily 8)  Caltrate 600+d   Tabs (Calcium Carbonate-Vitamin D Tabs) .Marland Kitchen.. 1 Daily 9)  Synthroid 125 Mcg Tabs (Levothyroxine Sodium) .... Take One Tablet By Mouth Daily 10)  Lantus Solostar 100 Unit/ml  Soln (Insulin Glargine) .... 68 Units Two Times A Day 11)  Onetouch Ultra Blue  Strp (Glucose Blood) .... Use As Directed. 12)  Triamcinolone Acetonide 0.1 % Oint (Triamcinolone Acetonide) .... Apply As Directed 13)  Ventolin Hfa 108 (90 Base)  Mcg/act Aers (Albuterol Sulfate) .Marland Kitchen.. 1-2 Puffs Q4-6 Hours As Needed Wheezing 14)  Fluticasone Propionate 50 Mcg/act  Susp (Fluticasone Propionate) .... 2 Sprays Each Nostril Once Daily 15)  Actos 30 Mg Tabs (Pioglitazone Hcl) .Marland Kitchen.. 1 Tab By Mouth Daily.  Allergies: 1)  ! Codeine Sulfate (Codeine Sulfate) 2)  ! Percocet 3)  ! Wellbutrin 4)  ! Demerol (Meperidine Hcl) 5)  ! Neurontin 6)  ! Zyprexa 7)  ! Mirtazapine 8)  ! Darvocet-N 100 9)  ! Lipitor 10)  ! Morphine Sulfate Cr (Morphine Sulfate) 11)  ! Crestor (Rosuvastatin Calcium) 12)  ! Lisinopril (Lisinopril) 13)  ! Nasonex (Mometasone Furoate) 14)  ! Zolpidem Tartrate (Zolpidem Tartrate) 15)  * Meperidine & Related Group 16)  Riomet (Metformin Hcl) 17)  Effexor  Past History:  Past Medical History: Last updated: 01/28/2009 Allergic rhinitis Diabetes mellitus, type II 2/04 Diverticulosis, colon GERD Depression--chronic Hypercholesterolemia    --intolerant of statins and niaspan Sleep apnea-mild    --PSG 12/09/07 RDI 12 Coronary artery disease s/p stent ACE Inhibitor induced cough  Past Surgical History: Last updated: 02/03/2010 Cholecystectomy 1993 Colectomy Lap.sigmoid 1/04 Hysterectomy 1976 Tonsillectomy 1957 Bartholin cyst Tubal ligation Bladder suspension Breast cyst removed x 3 Vaginal deliveries x 3 Stress ntivuew-basically neg EF 65% Diverticulitis 11/00 Pneumonia  3/09 Stents x 2--10/2007---PROMUS (RCA, LCX) thyroidectomy 01/2010  Family History: Last updated: 2009-03-22 Mom died of breast cancer (also 2 aunts) Dad with HTN, died of mesothelioma Brother died in MVA  Social History: Last updated: 06/23/2008 Married---3 children Malen Gauze parent but no children lately Stopped smoking around 9/09 No alcohol  Risk Factors: Smoking Status: current (09/09/2006) Packs/Day: 1/2 (12/09/2007)  Review of Systems      See HPI GI:  Denies abdominal pain, nausea, and vomiting.  Physical  Exam  General:  Well-developed,well-nourished,in no acute distress Psych:  normally interactive, good eye contact, not anxious appearing, and dysphoric affect.     Impression & Recommendations:  Problem # 1:  DIABETES MELLITUS, TYPE II (ICD-250.00) Assessment Deteriorated  Time spent with patient 25 minutes, more than 50% of this time was spent counseling patient and her husband on DM. we agreed to d/c apidra. continue lantus and restart actos 30 mg daily. follow up a1c in 3 months.  The following medications were removed from the medication list:    Apidra 100 Unit/ml Soln (Insulin glulisine) .Marland Kitchen... 28 units two times a day    Apidra 100 Unit/ml Soln (Insulin glulisine) ..... Inject 28 units twice a day Her updated medication list for this problem includes:    Bufferin 325 Mg Tabs (Aspirin buf(cacarb-mgcarb-mgo)) .Marland Kitchen... 1 by mouth daily    Lantus Solostar 100 Unit/ml Soln (Insulin glargine) .Marland KitchenMarland KitchenMarland KitchenMarland Kitchen 68 units two times a day    Actos 30 Mg Tabs (Pioglitazone hcl) .Marland Kitchen... 1 tab by mouth daily.  Orders: Prescription Created Electronically [phone])  Complete Medication List: 1)  Bufferin 325 Mg Tabs (Aspirin buf(cacarb-mgcarb-mgo)) .Marland Kitchen.. 1 by mouth daily 2)  Plavix 75 Mg Tabs (Clopidogrel bisulfate) .... Take 1 tablet by mouth once a day 3)  Nitrolingual 0.4 Mg/spray Tl Soln (Nitroglycerin) .... Use as needed for chest pain. 4)  Accu-chek Aviva Strp (Glucose blood) .... Check blood sugar twice a day 5)  Prozac 40 Mg Caps (Fluoxetine hcl) .Marland Kitchen.. 1 tab by mouth two times a day 6)  Trazodone Hcl 100 Mg Tabs (Trazodone hcl) .... 2-3 tabs at bedtime for insomnia 7)  Lansoprazole 30 Mg Cpdr (Lansoprazole) .... Take 1 tablet by mouth once daily 8)  Caltrate 600+d Tabs (Calcium carbonate-vitamin d tabs) .Marland Kitchen.. 1 daily 9)  Synthroid 125 Mcg Tabs (Levothyroxine sodium) .... Take one tablet by mouth daily 10)  Lantus Solostar 100 Unit/ml Soln (Insulin glargine) .... 68 units two times a day 11)  Onetouch  Ultra Blue Strp (Glucose blood) .... Use as directed. 12)  Triamcinolone Acetonide 0.1 % Oint (Triamcinolone acetonide) .... Apply as directed 13)  Ventolin Hfa 108 (90 Base) Mcg/act Aers (Albuterol sulfate) .Marland Kitchen.. 1-2 puffs q4-6 hours as needed wheezing 14)  Fluticasone Propionate 50 Mcg/act Susp (Fluticasone propionate) .... 2 sprays each nostril once daily 15)  Actos 30 Mg Tabs (Pioglitazone hcl) .Marland Kitchen.. 1 tab by mouth daily.  Patient Instructions: 1)  Please stop the Apidra. 2)  Lets restart Actos. 3)  Let's increase your prozac 40 mg twice daily. 4)  Come see for follow up a1c in 3 months (250.00) 5)  The medication list was reviewed and reconciled.  All changed / newly prescribed medications were explained.  A complete medication list was provided to the patient / caregiver.  Prescriptions: PROZAC 40 MG CAPS (FLUOXETINE HCL) 1 tab by mouth two times a day  #60 x 3   Entered and Authorized by:   Ruthe Mannan MD   Signed by:   Ruthe Mannan MD on 06/30/2010   Method used:  Electronically to        [facility] [address]* (retail)       [address]       [place], [place]  [address]       Ph: [phone]       Fax: [phone]   RxID:   [id number] ACTOS 30 MG TABS (PIOGLITAZONE HCL) 1 tab by mouth daily.  #30 x 3   Entered and Authorized by:   Ruthe Mannan MD   Signed by:   Ruthe Mannan MD on 06/30/2010   Method used:   Electronically to        [facility] [address]* (retail)       [address]       [place], [place]  [address]       Ph: [phone]       Fax: [phone]   RxID:   [id number]    Orders Added: 1)  Prescription Created Electronically [G8553] 2)  Est. Patient Level IV [01093]    Current Allergies (reviewed today): ! CODEINE SULFATE (CODEINE SULFATE) ! PERCOCET ! WELLBUTRIN ! DEMEROL (MEPERIDINE HCL) ! NEURONTIN ! ZYPREXA ! MIRTAZAPINE ! DARVOCET-N 100 ! LIPITOR ! MORPHINE SULFATE CR (MORPHINE SULFATE) ! CRESTOR (ROSUVASTATIN  CALCIUM) ! LISINOPRIL (LISINOPRIL) ! NASONEX (MOMETASONE FUROATE) ! ZOLPIDEM TARTRATE (ZOLPIDEM TARTRATE) * MEPERIDINE & RELATED GROUP RIOMET (METFORMIN HCL) EFFEXOR

## 2010-07-06 NOTE — Progress Notes (Signed)
Summary: needs order for mammogram  Phone Note Call from Patient Call back at Home Phone [phone]   Caller: Patient Call For: Ruthe Mannan MD Summary of Call: Pt needs order for mammogram, she gets these at [facility] clinic. Initial call taken by: Lowella Petties CMA, AAMA,  June 15, 2010 11:47 AM

## 2010-07-06 NOTE — Assessment & Plan Note (Signed)
Summary: injection in thumb/dlo   Vital Signs:  Patient profile:   65 year old female Height:      66 inches Weight:      184.8 pounds BMI:     29.94 Temp:     97.8 degrees F oral Pulse rate:   80 / minute Pulse rhythm:   regular BP sitting:   100 / 60  (left arm) Cuff size:   regular  Vitals Entered By: Benny Lennert CMA Duncan Dull) (May 22, 2010 12:23 PM)  History of Present Illness: Chief complaint ? injection in right thumb  R CMC OA flare, has tried virtually all meds, without much relief. CMC injection in the past has helped.  ROS: tol food, no cp, occ tingling.    Allergies: 1)  ! Codeine Sulfate (Codeine Sulfate) 2)  ! Percocet 3)  ! Wellbutrin 4)  ! Demerol (Meperidine Hcl) 5)  ! Neurontin 6)  ! Zyprexa 7)  ! Mirtazapine 8)  ! Darvocet-N 100 9)  ! Lipitor 10)  ! Morphine Sulfate Cr (Morphine Sulfate) 11)  ! Crestor (Rosuvastatin Calcium) 12)  ! Lisinopril (Lisinopril) 13)  ! Nasonex (Mometasone Furoate) 14)  ! Zolpidem Tartrate (Zolpidem Tartrate) 15)  * Meperidine & Related Group 16)  Riomet (Metformin Hcl) 17)  Effexor  Past History:  Past medical, surgical, family and social histories (including risk factors) reviewed, and no changes noted (except as noted below).  Past Medical History: Reviewed history from 01/28/2009 and no changes required. Allergic rhinitis Diabetes mellitus, type II 2/04 Diverticulosis, colon GERD Depression--chronic Hypercholesterolemia    --intolerant of statins and niaspan Sleep apnea-mild    --PSG 12/09/07 RDI 12 Coronary artery disease s/p stent ACE Inhibitor induced cough  Past Surgical History: Reviewed history from 02/03/2010 and no changes required. Cholecystectomy 1993 Colectomy Lap.sigmoid 1/04 Hysterectomy 1976 Tonsillectomy 1957 Bartholin cyst Tubal ligation Bladder suspension Breast cyst removed x 3 Vaginal deliveries x 3 Stress ntivuew-basically neg EF 65% Diverticulitis 11/00 Pneumonia   3/09 Stents x 2--10/2007---PROMUS (RCA, LCX) thyroidectomy 01/2010  Family History: Reviewed history from 02/22/2009 and no changes required. Mom died of breast cancer (also 2 aunts) Dad with HTN, died of mesothelioma Brother died in MVA  Social History: Reviewed history from 06/23/2008 and no changes required. Married---3 children Malen Gauze parent but no children lately Stopped smoking around 9/09 No alcohol  Physical Exam  General:  Well-developed,well-nourished,in no acute distress; alert,appropriate and cooperative throughout examination Msk:  R hand Ecchymosis or edema: neg ROM wrist/hand/digits: full  Carpals, MCP's, digits: TTP R 1st MCP Distal Ulna and Radius: NT Ecchymosis or edema: neg No instability Cysts/nodules: neg Digit triggering: neg Snuffbox tenderness: neg Scaphoid tubercle: NT Resisted supination: NT Full composite fist, no malrotation Grip, all digits: 4+/5 str DIPJT: NT PIP JT: NT MCP JT: NT No tenosynovitis Phalen's: neg Tinel's: neg Atrophy: Mild R thenar atrophy  Hand sensation: intact    Impression & Recommendations:  Problem # 1:  OSTEOARTHRITIS, CARPOMETACARPAL JOINT, 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  Str activity for grip including stress ball daily  Placed in thumb spica splint for nighttime use.  CMC joint injection, R Verbal consent was obtained. Risks, benefits, and alternatives were discussed. Prepped with Betadine and Ethyl Chloride used for anesthesia. Under sterile conditions, patient injected into 1st CMC joint at joint line in apex of snuffbox inserting perpendicularly with traction  placed on thumb. Aspiration yields no blood. Decreased pain post injection. No complications. Needle size: 25 gauge Injection: 1/2 cc of Marcaine 0.5% and 1/2 cc of Kenalog 40 mg   Her updated medication  list for this problem includes:    Bufferin 325 Mg Tabs (Aspirin buf(cacarb-mgcarb-mgo)) .Marland Kitchen... 1 by mouth daily  Orders: Joint Aspirate / Injection, Small (38756) Kenalog 10mg  (up to 2 units) (J3301)  Complete Medication List: 1)  Bufferin 325 Mg Tabs (Aspirin buf(cacarb-mgcarb-mgo)) .Marland Kitchen.. 1 by mouth daily 2)  Plavix 75 Mg Tabs (Clopidogrel bisulfate) .... Take 1 tablet by mouth once a day 3)  Nitrolingual 0.4 Mg/spray Tl Soln (Nitroglycerin) .... Use as needed for chest pain. 4)  Accu-chek Aviva Strp (Glucose blood) .... Check blood sugar twice a day 5)  Fluoxetine Hcl 40 Mg Caps (Fluoxetine hcl) .Marland Kitchen.. 1 by mouth once daily 6)  Trazodone Hcl 100 Mg Tabs (Trazodone hcl) .... 2-3 tabs at bedtime for insomnia 7)  Lansoprazole 30 Mg Cpdr (Lansoprazole) .... Take 1 tablet by mouth once daily 8)  Caltrate 600+d Tabs (Calcium carbonate-vitamin d tabs) .Marland Kitchen.. 1 daily 9)  Synthroid 125 Mcg Tabs (Levothyroxine sodium) .... Take one tablet by mouth daily 10)  Lantus Solostar 100 Unit/ml Soln (Insulin glargine) .... 65 units two times a day 11)  Apidra 100 Unit/ml Soln (Insulin glulisine) .... 28 units two times a day 12)  Onetouch Ultra Blue Strp (Glucose blood) .... Use as directed. 13)  Triamcinolone Acetonide 0.1 % Oint (Triamcinolone acetonide) .... Apply as directed 14)  Ventolin Hfa 108 (90 Base) Mcg/act Aers (Albuterol sulfate) .Marland Kitchen.. 1-2 puffs q4-6 hours as needed wheezing   Orders Added: 1)  Est. Patient Level III [43329] 2)  Joint Aspirate / Injection, Small [20600] 3)  Kenalog 10mg  (up to 2 units) [J3301]    Current Allergies (reviewed today): ! CODEINE SULFATE (CODEINE SULFATE) ! PERCOCET ! WELLBUTRIN ! DEMEROL (MEPERIDINE HCL) ! NEURONTIN ! ZYPREXA ! MIRTAZAPINE ! DARVOCET-N 100 ! LIPITOR ! MORPHINE SULFATE CR (MORPHINE SULFATE) ! CRESTOR (ROSUVASTATIN CALCIUM) ! LISINOPRIL (LISINOPRIL) ! NASONEX (MOMETASONE FUROATE) ! ZOLPIDEM TARTRATE (ZOLPIDEM TARTRATE) * MEPERIDINE &  RELATED GROUP RIOMET (METFORMIN HCL) EFFEXOR

## 2010-07-06 NOTE — Progress Notes (Signed)
Summary: fluoxetine  Phone Note Call from Patient   Caller: Roe Coombs from [facility] pharmacy  [phone] Call For: Ruthe Mannan MD Summary of Call: Patient told Roe Coombs that the fluoxetine had to be name brand only. Roe Coombs called to verify  that. Please advise.  Initial call taken by: Melody Comas,  June 30, 2010 1:23 PM  Follow-up for Phone Call        yes that was written on the prescription instructions. Ruthe Mannan MD  June 30, 2010 1:28 PM   Roe Coombs notified.  Follow-up by: Melody Comas,  June 30, 2010 1:34 PM

## 2010-07-06 NOTE — Progress Notes (Signed)
Summary: apidra is no longer available  Phone Note Call from Patient Call back at Home Phone [phone]   Caller: Spouse Roger Summary of Call: [facility] told pt that apidra insulin pen is no longer available, that needs to be changed to solution.  Solution is listed in the chart but her husband says she has been using the pen.  Please advise.                 Lowella Petties CMA, AAMA  June 19, 2010 4:27 PM   Follow-up for Phone Call        ok to send in solution, same number of units.  she may also need syringes, not sure what she has at home. Ruthe Mannan MD  June 19, 2010 4:41 PM   Additional Follow-up for Phone Call Additional follow up Details #1::        Advised pt and her husband.  Insulin sent to [facility].  Pt has some needles at home, will call when she needs a refill on that.             Additional Follow-up by: Lowella Petties CMA, AAMA,  June 20, 2010 5:10 PM    New/Updated Medications: APIDRA 100 UNIT/ML SOLN (INSULIN GLULISINE) inject 28 units twice a day  Prior Medications: BUFFERIN 325 MG TABS (ASPIRIN BUF(CACARB-MGCARB-MGO)) 1 by mouth daily PLAVIX 75 MG  TABS (CLOPIDOGREL BISULFATE) Take 1 tablet by mouth once a day NITROLINGUAL 0.4 MG/SPRAY TL SOLN (NITROGLYCERIN) Use as needed for chest pain. ACCU-CHEK AVIVA  STRP (GLUCOSE BLOOD) check blood sugar twice a day FLUOXETINE HCL 40 MG  CAPS (FLUOXETINE HCL) 1 by mouth once daily TRAZODONE HCL 100 MG TABS (TRAZODONE HCL) 2-3 tabs at bedtime for insomnia LANSOPRAZOLE 30 MG CPDR (LANSOPRAZOLE) take 1 tablet by mouth once daily CALTRATE 600+D   TABS (CALCIUM CARBONATE-VITAMIN D TABS) 1 daily SYNTHROID 125 MCG TABS (LEVOTHYROXINE SODIUM) take one tablet by mouth daily LANTUS SOLOSTAR 100 UNIT/ML  SOLN (INSULIN GLARGINE) 65 units two times a day APIDRA 100 UNIT/ML SOLN (INSULIN GLULISINE) 28 units two times a day ONETOUCH ULTRA BLUE  STRP (GLUCOSE BLOOD) Use as directed. TRIAMCINOLONE ACETONIDE 0.1 % OINT  (TRIAMCINOLONE ACETONIDE) apply as directed VENTOLIN HFA 108 (90 BASE) MCG/ACT AERS (ALBUTEROL SULFATE) 1-2 puffs Q4-6 hours as needed wheezing FLUTICASONE PROPIONATE 50 MCG/ACT  SUSP (FLUTICASONE PROPIONATE) 2 sprays each nostril once daily Current Allergies: ! CODEINE SULFATE (CODEINE SULFATE) ! PERCOCET ! WELLBUTRIN ! DEMEROL (MEPERIDINE HCL) ! NEURONTIN ! ZYPREXA ! MIRTAZAPINE ! DARVOCET-N 100 ! LIPITOR ! MORPHINE SULFATE CR (MORPHINE SULFATE) ! CRESTOR (ROSUVASTATIN CALCIUM) ! LISINOPRIL (LISINOPRIL) ! NASONEX (MOMETASONE FUROATE) ! ZOLPIDEM TARTRATE (ZOLPIDEM TARTRATE) * MEPERIDINE & RELATED GROUP RIOMET (METFORMIN HCL) EFFEXOR

## 2010-07-06 NOTE — Progress Notes (Signed)
----   Converted from flag ---- ---- 06/14/2010 3:24 PM, Mills Koller wrote: Request to credit sent. [name]  ---- 06/14/2010 10:16 AM, Ruthe Mannan MD wrote: yes please send a credit.  [name], can you please ask ms edward to come in for a1c (250.00) at her convenience? it was my error in ordering. thanks, [name]  ---- 06/14/2010 10:07 AM, Mills Koller wrote: I can send a credit for the test you didnt want, let me know. I can't add an A1C due to wrong sample type. Sorry, T  ---- 06/13/2010 6:03 PM, Ruthe Mannan MD wrote: [name], this is my fault. I was trying to order an a1c.  can we resend this for a1c and somehow not charge the pt for my error? thanks, talia ------------------------------

## 2010-07-06 NOTE — Progress Notes (Signed)
Summary: apidra is on back order  Phone Note From Pharmacy   Caller: [facility] Summary of Call: Apidra solostar is on manufacturer back order, form is on your desk for alternative and syringes.                Lowella Petties CMA, AAMA  June 30, 2010 8:14 AM   Follow-up for Phone Call        in my box. Ruthe Mannan MD  June 30, 2010 9:55 AM  Form faxed. Follow-up by: Lowella Petties CMA, AAMA,  June 30, 2010 10:06 AM

## 2010-07-06 NOTE — Assessment & Plan Note (Signed)
Summary: ?SINUS INFECTION/CLE   Vital Signs:  Patient profile:   65 year old female Height:      66 inches Weight:      185 pounds BMI:     29.97 Temp:     96.5 degrees F oral Pulse rate:   76 / minute Pulse rhythm:   regular BP sitting:   130 / 70  (left arm) Cuff size:   regular  Vitals Entered By: Linde Gillis CMA Duncan Dull) (June 13, 2010 11:32 AM) CC: sinus infection   History of Present Illness: 65 yo here for ? sinus infection.  Over 1 week of worsening sinus symptoms. Started with runny nose, dry cough. Now has extreme sinus pressure, feels she "cannot breath through her nose." No SOB or CP. No wheezing. No fevers that she is aware of. Did have chills last night.  Claritin D not helping. Not taking any nasal steroids.  Nasonex made her feel queezy.  Current Medications (verified): 1)  Bufferin 325 Mg Tabs (Aspirin Buf(Cacarb-Mgcarb-Mgo)) .Marland Kitchen.. 1 By Mouth Daily 2)  Plavix 75 Mg  Tabs (Clopidogrel Bisulfate) .... Take 1 Tablet By Mouth Once A Day 3)  Nitrolingual 0.4 Mg/spray Tl Soln (Nitroglycerin) .... Use As Needed For Chest Pain. 4)  Accu-Chek Aviva  Strp (Glucose Blood) .... Check Blood Sugar Twice A Day 5)  Fluoxetine Hcl 40 Mg  Caps (Fluoxetine Hcl) .Marland Kitchen.. 1 By Mouth Once Daily 6)  Trazodone Hcl 100 Mg Tabs (Trazodone Hcl) .... 2-3 Tabs At Bedtime For Insomnia 7)  Lansoprazole 30 Mg Cpdr (Lansoprazole) .... Take 1 Tablet By Mouth Once Daily 8)  Caltrate 600+d   Tabs (Calcium Carbonate-Vitamin D Tabs) .Marland Kitchen.. 1 Daily 9)  Synthroid 125 Mcg Tabs (Levothyroxine Sodium) .... Take One Tablet By Mouth Daily 10)  Lantus Solostar 100 Unit/ml  Soln (Insulin Glargine) .... 65 Units Two Times A Day 11)  Apidra 100 Unit/ml Soln (Insulin Glulisine) .... 28 Units Two Times A Day 12)  Onetouch Ultra Blue  Strp (Glucose Blood) .... Use As Directed. 13)  Triamcinolone Acetonide 0.1 % Oint (Triamcinolone Acetonide) .... Apply As Directed 14)  Ventolin Hfa 108 (90 Base) Mcg/act Aers  (Albuterol Sulfate) .Marland Kitchen.. 1-2 Puffs Q4-6 Hours As Needed Wheezing 15)  Azithromycin 250 Mg  Tabs (Azithromycin) .... 2 By  Mouth Today and Then 1 Daily For 4 Days 16)  Fluticasone Propionate 50 Mcg/act  Susp (Fluticasone Propionate) .... 2 Sprays Each Nostril Once Daily  Allergies: 1)  ! Codeine Sulfate (Codeine Sulfate) 2)  ! Percocet 3)  ! Wellbutrin 4)  ! Demerol (Meperidine Hcl) 5)  ! Neurontin 6)  ! Zyprexa 7)  ! Mirtazapine 8)  ! Darvocet-N 100 9)  ! Lipitor 10)  ! Morphine Sulfate Cr (Morphine Sulfate) 11)  ! Crestor (Rosuvastatin Calcium) 12)  ! Lisinopril (Lisinopril) 13)  ! Nasonex (Mometasone Furoate) 14)  ! Zolpidem Tartrate (Zolpidem Tartrate) 15)  * Meperidine & Related Group 16)  Riomet (Metformin Hcl) 17)  Effexor  Past History:  Past Medical History: Last updated: 01/28/2009 Allergic rhinitis Diabetes mellitus, type II 2/04 Diverticulosis, colon GERD Depression--chronic Hypercholesterolemia    --intolerant of statins and niaspan Sleep apnea-mild    --PSG 12/09/07 RDI 12 Coronary artery disease s/p stent ACE Inhibitor induced cough  Past Surgical History: Last updated: 02/03/2010 Cholecystectomy 1993 Colectomy Lap.sigmoid 1/04 Hysterectomy 1976 Tonsillectomy 1957 Bartholin cyst Tubal ligation Bladder suspension Breast cyst removed x 3 Vaginal deliveries x 3 Stress ntivuew-basically neg EF 65% Diverticulitis 11/00 Pneumonia  3/09  Stents x 2--10/2007---PROMUS (RCA, LCX) thyroidectomy 01/2010  Family History: Last updated: 2009-03-15 Mom died of breast cancer (also 2 aunts) Dad with HTN, died of mesothelioma Brother died in MVA  Social History: Last updated: 06/23/2008 Married---3 children Malen Gauze parent but no children lately Stopped smoking around 9/09 No alcohol  Risk Factors: Smoking Status: current (09/09/2006) Packs/Day: 1/2 (12/09/2007)  Review of Systems      See HPI General:  Complains of chills; denies fever. ENT:   Complains of nasal congestion, postnasal drainage, sinus pressure, and sore throat. Resp:  Complains of cough; denies shortness of breath, sputum productive, and wheezing.  Physical Exam  General:  Well-developed,well-nourished,in no acute distress; sounds congested VSS Ears:  R ear normal and L ear normal.   Nose:  boggy turbinates, sinuses TTP throughout. no airflow obstruction. Mouth:  MMM, no pharyngeal erythema Cervical Nodes:  No lymphadenopathy noted Psych:  normally interactive, good eye contact, not anxious appearing, and dysphoric affect.     Impression & Recommendations:  Problem # 1:  ACUTE SINUSITIS, UNSPECIFIED (ICD-461.9) Assessment New  Given duration and progression of symptoms, will treat for bacterial sinusitis with Zpack, restart Flonase. See pt instructions for details. Her updated medication list for this problem includes:    Azithromycin 250 Mg Tabs (Azithromycin) .Marland Kitchen... 2 by  mouth today and then 1 daily for 4 days    Fluticasone Propionate 50 Mcg/act Susp (Fluticasone propionate) .Marland Kitchen... 2 sprays each nostril once daily  Orders: Prescription Created Electronically [id number])  Complete Medication List: 1)  Bufferin 325 Mg Tabs (Aspirin buf(cacarb-mgcarb-mgo)) .Marland Kitchen.. 1 by mouth daily 2)  Plavix 75 Mg Tabs (Clopidogrel bisulfate) .... Take 1 tablet by mouth once a day 3)  Nitrolingual 0.4 Mg/spray Tl Soln (Nitroglycerin) .... Use as needed for chest pain. 4)  Accu-chek Aviva Strp (Glucose blood) .... Check blood sugar twice a day 5)  Fluoxetine Hcl 40 Mg Caps (Fluoxetine hcl) .Marland Kitchen.. 1 by mouth once daily 6)  Trazodone Hcl 100 Mg Tabs (Trazodone hcl) .... 2-3 tabs at bedtime for insomnia 7)  Lansoprazole 30 Mg Cpdr (Lansoprazole) .... Take 1 tablet by mouth once daily 8)  Caltrate 600+d Tabs (Calcium carbonate-vitamin d tabs) .Marland Kitchen.. 1 daily 9)  Synthroid 125 Mcg Tabs (Levothyroxine sodium) .... Take one tablet by mouth daily 10)  Lantus Solostar 100 Unit/ml Soln  (Insulin glargine) .... 65 units two times a day 11)  Apidra 100 Unit/ml Soln (Insulin glulisine) .... 28 units two times a day 12)  Onetouch Ultra Blue Strp (Glucose blood) .... Use as directed. 13)  Triamcinolone Acetonide 0.1 % Oint (Triamcinolone acetonide) .... Apply as directed 14)  Ventolin Hfa 108 (90 Base) Mcg/act Aers (Albuterol sulfate) .Marland Kitchen.. 1-2 puffs q4-6 hours as needed wheezing 15)  Azithromycin 250 Mg Tabs (Azithromycin) .... 2 by  mouth today and then 1 daily for 4 days 16)  Fluticasone Propionate 50 Mcg/act Susp (Fluticasone propionate) .... 2 sprays each nostril once daily  Other Orders: Venipuncture (57846) TLB-TSH (Thyroid Stimulating Hormone) (84443-TSH) TLB-IgA (Immunoglobulin A) (82784-IGA)  Patient Instructions: 1)  Take antibiotic as directed.  Drink lots of fluids.  Treat sympotmatically with Mucinex, nasal saline irrigation, and Tylenol/Ibuprofen. Also try claritin D or zyrtec D over the counter- two times a day as needed ( have to sign for them at pharmacy). You can use warm compresses.  Cough suppressant at night. Call if not improving as expected in 5-7 days.  Prescriptions: FLUTICASONE PROPIONATE 50 MCG/ACT  SUSP (FLUTICASONE PROPIONATE) 2 sprays each nostril once  daily  #1 vial x 3   Entered and Authorized by:   Ruthe Mannan MD   Signed by:   Ruthe Mannan MD on 06/13/2010   Method used:   Electronically to        [facility] [address] (retail)       [address]       [place], [place]  [address]       Ph: [phone]       Fax: [phone]   RxID:   [id number] AZITHROMYCIN 250 MG  TABS (AZITHROMYCIN) 2 by  mouth today and then 1 daily for 4 days  #6 x 0   Entered and Authorized by:   Ruthe Mannan MD   Signed by:   Ruthe Mannan MD on 06/13/2010   Method used:   Electronically to        [facility] [address] (retail)       [address]       [place], [place]  [address]       Ph: [phone]       Fax: [phone]   RxID:    [id number]    Orders Added: 1)  Venipuncture [20254] 2)  TLB-TSH (Thyroid Stimulating Hormone) [84443-TSH] 3)  TLB-IgA (Immunoglobulin A) [82784-IGA] 4)  Prescription Created Electronically [G8553] 5)  Est. Patient Level III [27062]    Current Allergies (reviewed today): ! CODEINE SULFATE (CODEINE SULFATE) ! PERCOCET ! WELLBUTRIN ! DEMEROL (MEPERIDINE HCL) ! NEURONTIN ! ZYPREXA ! MIRTAZAPINE ! DARVOCET-N 100 ! LIPITOR ! MORPHINE SULFATE CR (MORPHINE SULFATE) ! CRESTOR (ROSUVASTATIN CALCIUM) ! LISINOPRIL (LISINOPRIL) ! NASONEX (MOMETASONE FUROATE) ! ZOLPIDEM TARTRATE (ZOLPIDEM TARTRATE) * MEPERIDINE & RELATED GROUP RIOMET (METFORMIN HCL) EFFEXOR

## 2010-07-07 ENCOUNTER — Telehealth: Payer: Self-pay | Admitting: Family Medicine

## 2010-07-12 NOTE — Progress Notes (Signed)
Summary: refill request for xanax  Phone Note Refill Request Message from:  Fax from Pharmacy  Refills Requested: Medication #1:  alprazolam .25 mg's   Last Refilled: 03/16/2010 [facility]axed request from [facility].  This is no longer on med list.  Initial call taken by: Lowella Petties CMA, AAMA,  July 07, 2010 11:54 AM  Follow-up for Phone Call        Rx called to pharmacy Follow-up by: Linde Gillis CMA Duncan Dull),  July 07, 2010 12:07 PM    New/Updated Medications: ALPRAZOLAM 0.25 MG TABS (ALPRAZOLAM) 1 tab by mouth three times a day as needed anxiety Prescriptions: ALPRAZOLAM 0.25 MG TABS (ALPRAZOLAM) 1 tab by mouth three times a day as needed anxiety  #60 x 0   Entered and Authorized by:   Ruthe Mannan MD   Signed by:   Ruthe Mannan MD on 07/07/2010   Method used:   Telephoned to ...       [facility] [address]* (retail)       [address]       [place], [place]  [address]       Ph: [phone]       Fax: [phone]   RxID:   [id number]

## 2010-07-12 NOTE — Miscellaneous (Signed)
Summary: Orders Update   Clinical Lists Changes  Problems: Added new problem of MAMMOGRAM, ABNORMAL, LEFT (ICD-793.80) Orders: Added new Referral order of Radiology Referral (Radiology) - Signed

## 2010-07-19 ENCOUNTER — Encounter: Payer: Self-pay | Admitting: Family Medicine

## 2010-07-19 ENCOUNTER — Telehealth: Payer: Self-pay | Admitting: Family Medicine

## 2010-07-19 ENCOUNTER — Ambulatory Visit (INDEPENDENT_AMBULATORY_CARE_PROVIDER_SITE_OTHER): Payer: Medicare Other | Admitting: Family Medicine

## 2010-07-19 DIAGNOSIS — E119 Type 2 diabetes mellitus without complications: Secondary | ICD-10-CM

## 2010-07-21 ENCOUNTER — Telehealth: Payer: Self-pay | Admitting: Family Medicine

## 2010-07-24 ENCOUNTER — Encounter: Payer: Self-pay | Admitting: Family Medicine

## 2010-07-24 HISTORY — PX: BREAST BIOPSY: SHX20

## 2010-07-26 NOTE — Assessment & Plan Note (Signed)
Summary: 3 WK F/U/CLE    [employer]/MEDICARE   Vital Signs:  Patient profile:   65 year old female Height:      66 inches Weight:      188.75 pounds BMI:     30.58 Temp:     97.7 degrees F oral Pulse rate:   60 / minute Pulse rhythm:   regular BP sitting:   130 / 68  (right arm) Cuff size:   regular  Vitals Entered By: Linde Gillis CMA Duncan Dull) (July 19, 2010 8:07 AM) CC: 3 week follow up   History of Present Illness: 65 yo here to follow up DM medications.  was being followed by endocrinology but she no longer wants to see a specialist.  a1c was 7.7 on 06/16/2010 ( 7.5 on 10/10).  was taking Lantus 68 units twice daily, Aphjidra 28 units twice daily but felt like it was not controlling her sugars.  has been on metformin, gylburide, glipizide all which call GI upset/shakiness. byetta also causes GI upset.  started actos 30 mg daily on 06/30/2010 and d/c'd the aphidra. she feels this is working well.  Had a couple of episodes of hypoglycemia in early morning.  CBGs ranging 80-260.    Current Medications (verified): 1)  Bufferin 325 Mg Tabs (Aspirin Buf(Cacarb-Mgcarb-Mgo)) .Marland Kitchen.. 1 By Mouth Daily 2)  Plavix 75 Mg  Tabs (Clopidogrel Bisulfate) .... Take 1 Tablet By Mouth Once A Day 3)  Nitrolingual 0.4 Mg/spray Tl Soln (Nitroglycerin) .... Use As Needed For Chest Pain. 4)  Accu-Chek Aviva  Strp (Glucose Blood) .... Check Blood Sugar Twice A Day 5)  Prozac 40 Mg Caps (Fluoxetine Hcl) .Marland Kitchen.. 1 Tab By Mouth Two Times A Day 6)  Trazodone Hcl 100 Mg Tabs (Trazodone Hcl) .... 2-3 Tabs At Bedtime For Insomnia 7)  Lansoprazole 30 Mg Cpdr (Lansoprazole) .... Take 1 Tablet By Mouth Once Daily 8)  Caltrate 600+d   Tabs (Calcium Carbonate-Vitamin D Tabs) .Marland Kitchen.. 1 Daily 9)  Synthroid 125 Mcg Tabs (Levothyroxine Sodium) .... Take One Tablet By Mouth Daily 10)  Lantus Solostar 100 Unit/ml  Soln (Insulin Glargine) .... 68 Units Two Times A Day 11)  Onetouch Ultra Blue  Strp (Glucose Blood) .... Use  As Directed. 12)  Triamcinolone Acetonide 0.1 % Oint (Triamcinolone Acetonide) .... Apply As Directed 13)  Ventolin Hfa 108 (90 Base) Mcg/act Aers (Albuterol Sulfate) .Marland Kitchen.. 1-2 Puffs Q4-6 Hours As Needed Wheezing 14)  Fluticasone Propionate 50 Mcg/act  Susp (Fluticasone Propionate) .... 2 Sprays Each Nostril Once Daily 15)  Actos 30 Mg Tabs (Pioglitazone Hcl) .Marland Kitchen.. 1 Tab By Mouth Daily. 16)  Alprazolam 0.25 Mg Tabs (Alprazolam) .Marland Kitchen.. 1 Tab By Mouth Three Times A Day As Needed Anxiety  Allergies: 1)  ! Codeine Sulfate (Codeine Sulfate) 2)  ! Percocet 3)  ! Wellbutrin 4)  ! Demerol (Meperidine Hcl) 5)  ! Neurontin 6)  ! Zyprexa 7)  ! Mirtazapine 8)  ! Darvocet-N 100 9)  ! Lipitor 10)  ! Morphine Sulfate Cr (Morphine Sulfate) 11)  ! Crestor (Rosuvastatin Calcium) 12)  ! Lisinopril (Lisinopril) 13)  ! Nasonex (Mometasone Furoate) 14)  ! Zolpidem Tartrate (Zolpidem Tartrate) 15)  * Meperidine & Related Group 16)  Riomet (Metformin Hcl) 17)  Effexor  Past History:  Past Medical History: Last updated: 01/28/2009 Allergic rhinitis Diabetes mellitus, type II 2/04 Diverticulosis, colon GERD Depression--chronic Hypercholesterolemia    --intolerant of statins and niaspan Sleep apnea-mild    --PSG 12/09/07 RDI 12 Coronary artery disease s/p stent  ACE Inhibitor induced cough  Past Surgical History: Last updated: 02/03/2010 Cholecystectomy 1993 Colectomy Lap.sigmoid 1/04 Hysterectomy 1976 Tonsillectomy 1957 Bartholin cyst Tubal ligation Bladder suspension Breast cyst removed x 3 Vaginal deliveries x 3 Stress ntivuew-basically neg EF 65% Diverticulitis 11/00 Pneumonia  3/09 Stents x 2--10/2007---PROMUS (RCA, LCX) thyroidectomy 01/2010  Family History: Last updated: Mar 23, 2009 Mom died of breast cancer (also 2 aunts) Dad with HTN, died of mesothelioma Brother died in MVA  Social History: Last updated: 06/23/2008 Married---3 children Malen Gauze parent but no children  lately Stopped smoking around 9/09 No alcohol  Risk Factors: Smoking Status: current (09/09/2006) Packs/Day: 1/2 (12/09/2007)  Review of Systems      See HPI General:  Denies malaise. Eyes:  Denies blurring. CV:  Denies chest pain or discomfort. Endo:  Denies excessive thirst and excessive urination.  Physical Exam  General:  Well-developed,well-nourished,in no acute distress Mouth:  MMM, no pharyngeal erythema Lungs:  Normal respiratory effort, chest expands symmetrically. Lungs are clear to auscultation, no crackles or wheezes.   Heart:  Normal rate and regular rhythm. S1 and S2 normal without gallop, murmur, click, rub or other extra sounds. Extremities:  trace  left pedal edema and trace right pedal edema. mild pitting.  no calf tenderness Psych:  normally interactive, good eye contact, not anxious appearing, and dysphoric affect.     Impression & Recommendations:  Problem # 1:  DIABETES MELLITUS, TYPE II (ICD-250.00) Assessment Unchanged decrease lantus to 65 units, keep CBG log and call me with results. continue actos 30 mg.  will consider decreasing to 15 mg daily if still having episodes of hypoglycemia. Her updated medication list for this problem includes:    Bufferin 325 Mg Tabs (Aspirin buf(cacarb-mgcarb-mgo)) .Marland Kitchen... 1 by mouth daily    Lantus Solostar 100 Unit/ml Soln (Insulin glargine) .Marland KitchenMarland KitchenMarland KitchenMarland Kitchen 65 units two times a day    Actos 30 Mg Tabs (Pioglitazone hcl) .Marland Kitchen... 1 tab by mouth daily.  Complete Medication List: 1)  Bufferin 325 Mg Tabs (Aspirin buf(cacarb-mgcarb-mgo)) .Marland Kitchen.. 1 by mouth daily 2)  Plavix 75 Mg Tabs (Clopidogrel bisulfate) .... Take 1 tablet by mouth once a day 3)  Nitrolingual 0.4 Mg/spray Tl Soln (Nitroglycerin) .... Use as needed for chest pain. 4)  Accu-chek Aviva Strp (Glucose blood) .... Check blood sugar twice a day 5)  Prozac 40 Mg Caps (Fluoxetine hcl) .Marland Kitchen.. 1 tab by mouth two times a day 6)  Trazodone Hcl 100 Mg Tabs (Trazodone hcl) .... 2-3  tabs at bedtime for insomnia 7)  Lansoprazole 30 Mg Cpdr (Lansoprazole) .... Take 1 tablet by mouth once daily 8)  Caltrate 600+d Tabs (Calcium carbonate-vitamin d tabs) .Marland Kitchen.. 1 daily 9)  Synthroid 125 Mcg Tabs (Levothyroxine sodium) .... Take one tablet by mouth daily 10)  Lantus Solostar 100 Unit/ml Soln (Insulin glargine) .... 65 units two times a day 11)  Onetouch Ultra Blue Strp (Glucose blood) .... Use as directed. 12)  Triamcinolone Acetonide 0.1 % Oint (Triamcinolone acetonide) .... Apply as directed 13)  Ventolin Hfa 108 (90 Base) Mcg/act Aers (Albuterol sulfate) .Marland Kitchen.. 1-2 puffs q4-6 hours as needed wheezing 14)  Fluticasone Propionate 50 Mcg/act Susp (Fluticasone propionate) .... 2 sprays each nostril once daily 15)  Actos 30 Mg Tabs (Pioglitazone hcl) .Marland Kitchen.. 1 tab by mouth daily. 16)  Alprazolam 0.25 Mg Tabs (Alprazolam) .Marland Kitchen.. 1 tab by mouth three times a day as needed anxiety  Patient Instructions: 1)  Great to see you. 2)  Let's decrease your Lantus to 65 units twice daily. 3)  Keep a log of your sugars and call me next week with an update. Prescriptions: ACTOS 30 MG TABS (PIOGLITAZONE HCL) 1 tab by mouth daily.  #90 x 3   Entered and Authorized by:   Ruthe Mannan MD   Signed by:   Ruthe Mannan MD on 07/19/2010   Method used:   Faxed to ...       [facility] (mail-order)       [address] [place], [place]  [address]       Ph: [phone]       Fax: [phone]   RxID:   [id number]    Orders Added: 1)  Est. Patient Level III [84132]    Current Allergies (reviewed today): ! CODEINE SULFATE (CODEINE SULFATE) ! PERCOCET ! WELLBUTRIN ! DEMEROL (MEPERIDINE HCL) ! NEURONTIN ! ZYPREXA ! MIRTAZAPINE ! DARVOCET-N 100 ! LIPITOR ! MORPHINE SULFATE CR (MORPHINE SULFATE) ! CRESTOR (ROSUVASTATIN CALCIUM) ! LISINOPRIL (LISINOPRIL) ! NASONEX (MOMETASONE FUROATE) ! ZOLPIDEM TARTRATE (ZOLPIDEM TARTRATE) * MEPERIDINE & RELATED GROUP RIOMET (METFORMIN HCL) EFFEXOR

## 2010-07-26 NOTE — Progress Notes (Addendum)
Summary: needs letter of necessity for prozac  Phone Note Other Incoming   Caller: [employer] Summary of Call: In order for pt's insurance to cover prozac they will need a letter of necessity from you on letterhead.  This has to include pt's name, DOB, ID#- which is [id number], medicine dose, diagnosis and documentation as to why pt needs this.  Fax to [phone].  Neither prozac nor generic are covered under pts plan, without approval. Initial call taken by: Lowella Petties CMA, AAMA,  July 19, 2010 10:30 AM  Follow-up for Phone Call        in my box. Ruthe Mannan MD  July 19, 2010 10:34 AM  Letter faxed.              Lowella Petties CMA, AAMA  July 19, 2010 12:02 PM       Appended Document: needs letter of necessity for prozac Approval given for prozac, approval letter placed on doctor's desk for signature and scanning.

## 2010-07-26 NOTE — Letter (Signed)
Summary: *Referral Letter  [place] [facility]  [address] [place], [place] [address]   Phone: [phone]  Fax: [phone]    07/19/2010    [name] DOB April 10, 1946 [id number], [address] [place], [place]  [address]  To whom it may concern: [name] is taking Prozac 40 mg twice daily for her anxiety and depression.  This is medically necessary as she has tried other antidepressants in past.  Sincerely,  Ruthe Mannan MD

## 2010-08-01 ENCOUNTER — Ambulatory Visit (INDEPENDENT_AMBULATORY_CARE_PROVIDER_SITE_OTHER): Payer: Medicare Other | Admitting: Family Medicine

## 2010-08-01 ENCOUNTER — Other Ambulatory Visit: Payer: Self-pay | Admitting: Family Medicine

## 2010-08-01 ENCOUNTER — Encounter: Payer: Self-pay | Admitting: Family Medicine

## 2010-08-01 DIAGNOSIS — E119 Type 2 diabetes mellitus without complications: Secondary | ICD-10-CM

## 2010-08-01 DIAGNOSIS — E039 Hypothyroidism, unspecified: Secondary | ICD-10-CM

## 2010-08-01 LAB — TSH: TSH: 0.95 u[IU]/mL (ref 0.35–5.50)

## 2010-08-01 LAB — BASIC METABOLIC PANEL
BUN: 12 mg/dL (ref 6–23)
GFR: 96.96 mL/min (ref >60.00)
Potassium: 4.5 mEq/L (ref 3.5–5.1)
Sodium: 139 mEq/L (ref 135–145)

## 2010-08-01 NOTE — Progress Notes (Signed)
Summary: ? regarding Actos  Phone Note Call from Patient Call back at Home Phone [phone]   Caller: Patient Call For: Ruthe Mannan MD Summary of Call: Patient called to let Dr. Dayton Martes know that she is experiencing jerking and low blood sugar while on Actos.  Please advise. Initial call taken by: Linde Gillis CMA Duncan Dull),  July 21, 2010 4:43 PM  Follow-up for Phone Call        is she taking 15 mg now or still 30 mg of actos? Ruthe Mannan MD  July 24, 2010 7:45 AM  Called patient x 3 and got a busy signal each time.  Will call back later.  Linde Gillis CMA Duncan Dull)  July 24, 2010 10:22 AM   Spoke with patient and she was taking 15mg  of Actos but she stopped it on Sunday night.  She is still waking up with low blood sugars and jitters.  Lowest BS was 73, with 145 in the morning.  Linde Gillis CMA Duncan Dull)  July 24, 2010 11:31 AM    Additional Follow-up for Phone Call Additional follow up Details #1::        please ask her to decrease her lantus to 60 units twice daily. Ruthe Mannan MD  July 24, 2010 11:35 AM  Spoke with patient and she stated that she decreased her Lantus to 60 units two times a day when she was here on 07/19/2010.  She stated that she really does not want to be on Actos.  She has tried it and doesn't like the way it makes her feel.  Please advise.  Linde Gillis CMA Duncan Dull)  July 24, 2010 12:05 PM      Additional Follow-up for Phone Call Additional follow up Details #2::    ok to stop the actos.  I thought she mentioned that her CBGs are low even off the actos.  If so, please decrease her lantus to 58 units two times a day.  change dose on med list in emr. Ruthe Mannan MD  July 24, 2010 12:14 PM  She stated that her BS goes up during the day.  She will decrease her Lantus to 58 units two times a day.   Follow-up by: Linde Gillis CMA Duncan Dull),  July 24, 2010 12:30 PM  New/Updated Medications: LANTUS SOLOSTAR 100 UNIT/ML  SOLN (INSULIN GLARGINE) 58  units two times a day  Current Allergies (reviewed today): ! CODEINE SULFATE (CODEINE SULFATE) ! PERCOCET ! WELLBUTRIN ! DEMEROL (MEPERIDINE HCL) ! NEURONTIN ! ZYPREXA ! MIRTAZAPINE ! DARVOCET-N 100 ! LIPITOR ! MORPHINE SULFATE CR (MORPHINE SULFATE) ! CRESTOR (ROSUVASTATIN CALCIUM) ! LISINOPRIL (LISINOPRIL) ! NASONEX (MOMETASONE FUROATE) ! ZOLPIDEM TARTRATE (ZOLPIDEM TARTRATE) * MEPERIDINE & RELATED GROUP RIOMET (METFORMIN HCL) EFFEXOR

## 2010-08-10 NOTE — Medication Information (Signed)
Summary: Prozac Approved  Prozac Approved   Imported By: Maryln Gottron 07/31/2010 10:22:17  _____________________________________________________________________  External Attachment:    Type:   Image     Comment:   External Document

## 2010-08-10 NOTE — Assessment & Plan Note (Signed)
Summary: ? ON INSULIN/RBH   Vital Signs:  Patient profile:   65 year old female Height:      66 inches Weight:      190.50 pounds BMI:     30.86 Temp:     97.8 degrees F oral Pulse rate:   66 / minute Pulse rhythm:   regular BP sitting:   130 / 70  (left arm) Cuff size:   regular  Vitals Entered By: Linde Gillis CMA Duncan Dull) (August 01, 2010 9:05 AM) CC: questions about insulin   History of Present Illness: 65 yo here to follow up DM medications.  was being followed by endocrinology but she no longer wants to see a specialist.  a1c was 7.7 on 06/16/2010 ( 7.5 on 10/10).  was taking Lantus 68 units twice daily, Aphjidra 28 units twice daily but felt like it was not controlling her sugars.  has been on metformin, gylburide, glipizide all which call GI upset/shakiness. byetta also causes GI upset.  recently tried actos again (had success with it in past) but it made her too jittery.  Now taking Lantus 58 units two times a day only.  CBG log is excellent today- 73-198, however Ms. Droege thinks that her insulin is too high because whe feels shaky all the time, regardless of blood sugar.  CBGs have not dropped below 73 so she is confused as to why she feels jittery and anxious.  More hot flashes as well.  No changes in her bowel habits- chronic constipation.  s/p total thyroidectomy for thyroid nodules- On Synthroid 125 micrograms daily and has not followed up with endocrinology in several months.   Current Medications (verified): 1)  Bufferin 325 Mg Tabs (Aspirin Buf(Cacarb-Mgcarb-Mgo)) .Marland Kitchen.. 1 By Mouth Daily 2)  Plavix 75 Mg  Tabs (Clopidogrel Bisulfate) .... Take 1 Tablet By Mouth Once A Day 3)  Nitrolingual 0.4 Mg/spray Tl Soln (Nitroglycerin) .... Use As Needed For Chest Pain. 4)  Accu-Chek Aviva  Strp (Glucose Blood) .... Check Blood Sugar Twice A Day 5)  Prozac 40 Mg Caps (Fluoxetine Hcl) .Marland Kitchen.. 1 Tab By Mouth Two Times A Day 6)  Trazodone Hcl 100 Mg Tabs (Trazodone Hcl)  .... 2-3 Tabs At Bedtime For Insomnia 7)  Lansoprazole 30 Mg Cpdr (Lansoprazole) .... Take 1 Tablet By Mouth Once Daily 8)  Caltrate 600+d   Tabs (Calcium Carbonate-Vitamin D Tabs) .Marland Kitchen.. 1 Daily 9)  Synthroid 125 Mcg Tabs (Levothyroxine Sodium) .... Take One Tablet By Mouth Daily 10)  Lantus Solostar 100 Unit/ml  Soln (Insulin Glargine) .... 58 Units Two Times A Day 11)  Onetouch Ultra Blue  Strp (Glucose Blood) .... Use As Directed. 12)  Triamcinolone Acetonide 0.1 % Oint (Triamcinolone Acetonide) .... Apply As Directed 13)  Ventolin Hfa 108 (90 Base) Mcg/act Aers (Albuterol Sulfate) .Marland Kitchen.. 1-2 Puffs Q4-6 Hours As Needed Wheezing 14)  Fluticasone Propionate 50 Mcg/act  Susp (Fluticasone Propionate) .... 2 Sprays Each Nostril Once Daily 15)  Alprazolam 0.25 Mg Tabs (Alprazolam) .Marland Kitchen.. 1 Tab By Mouth Three Times A Day As Needed Anxiety  Allergies: 1)  ! Codeine Sulfate (Codeine Sulfate) 2)  ! Percocet 3)  ! Wellbutrin 4)  ! Demerol (Meperidine Hcl) 5)  ! Neurontin 6)  ! Zyprexa 7)  ! Mirtazapine 8)  ! Darvocet-N 100 9)  ! Lipitor 10)  ! Morphine Sulfate Cr (Morphine Sulfate) 11)  ! Crestor (Rosuvastatin Calcium) 12)  ! Lisinopril (Lisinopril) 13)  ! Nasonex (Mometasone Furoate) 14)  ! Zolpidem Tartrate (Zolpidem Tartrate)  15)  * Meperidine & Related Group 16)  Riomet (Metformin Hcl) 17)  Effexor  Past History:  Past Medical History: Last updated: 01/28/2009 Allergic rhinitis Diabetes mellitus, type II 2/04 Diverticulosis, colon GERD Depression--chronic Hypercholesterolemia    --intolerant of statins and niaspan Sleep apnea-mild    --PSG 12/09/07 RDI 12 Coronary artery disease s/p stent ACE Inhibitor induced cough  Past Surgical History: Last updated: 02/03/2010 Cholecystectomy 1993 Colectomy Lap.sigmoid 1/04 Hysterectomy 1976 Tonsillectomy 1957 Bartholin cyst Tubal ligation Bladder suspension Breast cyst removed x 3 Vaginal deliveries x 3 Stress ntivuew-basically  neg EF 65% Diverticulitis 11/00 Pneumonia  3/09 Stents x 2--10/2007---PROMUS (RCA, LCX) thyroidectomy 01/2010  Family History: Last updated: 03-04-2009 Mom died of breast cancer (also 2 aunts) Dad with HTN, died of mesothelioma Brother died in MVA  Social History: Last updated: 06/23/2008 Married---3 children Malen Gauze parent but no children lately Stopped smoking around 9/09 No alcohol  Risk Factors: Smoking Status: current (09/09/2006) Packs/Day: 1/2 (12/09/2007)  Review of Systems      See HPI General:  Denies fever and malaise. Eyes:  Denies blurring. ENT:  Denies difficulty swallowing. CV:  Complains of palpitations; denies lightheadness. Resp:  Denies shortness of breath. GI:  Denies abdominal pain and change in bowel habits. Psych:  Complains of anxiety and irritability.  Physical Exam  General:  Well-developed,well-nourished,in no acute distress Mouth:  MMM Neck:  well healed thyroid surgical scar, no masses or tenderness Lungs:  Normal respiratory effort, chest expands symmetrically. Lungs are clear to auscultation, no crackles or wheezes.   Heart:  Normal rate and regular rhythm. S1 and S2 normal without gallop, murmur, click, rub or other extra sounds. Extremities:  trace  left pedal edema and trace right pedal edema. mild pitting.  no calf tenderness Psych:  normally interactive, good eye contact, not anxious appearing, and dysphoric affect.     Impression & Recommendations:  Problem # 1:  DIABETES MELLITUS, TYPE II (ICD-250.00) Assessment Unchanged according to CBGs, Ms. Scaglione appear to be doing well. I question if perhaps it is her thyroid that is causing the symptoms she is having. Continue current dose of Lantus.  see below.  The following medications were removed from the medication list:    Actos 30 Mg Tabs (Pioglitazone hcl) .Marland Kitchen... 1 tab by mouth daily. Her updated medication list for this problem includes:    Bufferin 325 Mg Tabs (Aspirin  buf(cacarb-mgcarb-mgo)) .Marland Kitchen... 1 by mouth daily    Lantus Solostar 100 Unit/ml Soln (Insulin glargine) .Marland KitchenMarland KitchenMarland KitchenMarland Kitchen 58 units two times a day  Orders: Venipuncture (14782) TLB-BMP (Basic Metabolic Panel-BMET) (80048-METABOL)  Problem # 2:  UNSPECIFIED HYPOTHYROIDISM (ICD-244.9) Assessment: Deteriorated recheck TSH, FT4 today. Her updated medication list for this problem includes:    Synthroid 125 Mcg Tabs (Levothyroxine sodium) .Marland Kitchen... Take one tablet by mouth daily  Orders: TLB-BMP (Basic Metabolic Panel-BMET) (80048-METABOL) TLB-TSH (Thyroid Stimulating Hormone) (84443-TSH) TLB-T4 (Thyrox), Free [phone]) Prescription Created Electronically [phone]  Complete Medication List: 1)  Bufferin 325 Mg Tabs (Aspirin buf(cacarb-mgcarb-mgo)) .Marland Kitchen.. 1 by mouth daily 2)  Plavix 75 Mg Tabs (Clopidogrel bisulfate) .... Take 1 tablet by mouth once a day 3)  Nitrolingual 0.4 Mg/spray Tl Soln (Nitroglycerin) .... Use as needed for chest pain. 4)  Accu-chek Aviva Strp (Glucose blood) .... Check blood sugar twice a day 5)  Prozac 40 Mg Caps (Fluoxetine hcl) .Marland Kitchen.. 1 tab by mouth two times a day 6)  Trazodone Hcl 100 Mg Tabs (Trazodone hcl) .... 2-3 tabs at bedtime for insomnia 7)  Lansoprazole  30 Mg Cpdr (Lansoprazole) .... Take 1 tablet by mouth once daily 8)  Caltrate 600+d Tabs (Calcium carbonate-vitamin d tabs) .Marland Kitchen.. 1 daily 9)  Synthroid 125 Mcg Tabs (Levothyroxine sodium) .... Take one tablet by mouth daily 10)  Lantus Solostar 100 Unit/ml Soln (Insulin glargine) .... 58 units two times a day 11)  Onetouch Ultra Blue Strp (Glucose blood) .... Use as directed. 12)  Triamcinolone Acetonide 0.1 % Oint (Triamcinolone acetonide) .... Apply as directed 13)  Ventolin Hfa 108 (90 Base) Mcg/act Aers (Albuterol sulfate) .Marland Kitchen.. 1-2 puffs q4-6 hours as needed wheezing 14)  Fluticasone Propionate 50 Mcg/act Susp (Fluticasone propionate) .... 2 sprays each nostril once daily 15)  Alprazolam 0.25 Mg Tabs (Alprazolam) .Marland Kitchen..  1 tab by mouth three times a day as needed anxiety Prescriptions: PROZAC 40 MG CAPS (FLUOXETINE HCL) 1 tab by mouth two times a day  #90 x 3   Entered and Authorized by:   Ruthe Mannan MD   Signed by:   Ruthe Mannan MD on 08/01/2010   Method used:   Faxed to ...       [facility] (mail-order)       [address] [place], [place]  [address]       Ph: [phone]       Fax: [phone]   RxID:   [id number]    Orders Added: 1)  Venipuncture [84132] 2)  TLB-BMP (Basic Metabolic Panel-BMET) [80048-METABOL] 3)  TLB-TSH (Thyroid Stimulating Hormone) [84443-TSH] 4)  TLB-T4 (Thyrox), Free [44010-UV2Z] 5)  Est. Patient Level IV [36644] 6)  Prescription Created Electronically [phone]    Current Allergies (reviewed today): ! CODEINE SULFATE (CODEINE SULFATE) ! PERCOCET ! WELLBUTRIN ! DEMEROL (MEPERIDINE HCL) ! NEURONTIN ! ZYPREXA ! MIRTAZAPINE ! DARVOCET-N 100 ! LIPITOR ! MORPHINE SULFATE CR (MORPHINE SULFATE) ! CRESTOR (ROSUVASTATIN CALCIUM) ! LISINOPRIL (LISINOPRIL) ! NASONEX (MOMETASONE FUROATE) ! ZOLPIDEM TARTRATE (ZOLPIDEM TARTRATE) * MEPERIDINE & RELATED GROUP RIOMET (METFORMIN HCL) EFFEXOR

## 2010-08-17 ENCOUNTER — Telehealth: Payer: Self-pay | Admitting: Family Medicine

## 2010-08-19 ENCOUNTER — Encounter: Payer: Self-pay | Admitting: Family Medicine

## 2010-08-22 NOTE — Progress Notes (Signed)
Summary: refill request for trazodone  Phone Note Refill Request Message from:  Fax from Pharmacy  Refills Requested: Medication #1:  TRAZODONE HCL 100 MG TABS 2-3 tabs at bedtime for insomnia   Last Refilled: 07/17/2010 Faxed request from [facility], [phone].  Initial call taken by: Lowella Petties CMA, AAMA,  August 17, 2010 10:59 AM    Prescriptions: TRAZODONE HCL 100 MG TABS (TRAZODONE HCL) 2-3 tabs at bedtime for insomnia  #90 x 3   Entered and Authorized by:   Ruthe Mannan MD   Signed by:   Ruthe Mannan MD on 08/17/2010   Method used:   Electronically to        [facility] [address] (retail)       [address]       [place], [place]  [address]       Ph: [phone]       Fax: [phone]   RxID:   [id number]

## 2010-08-23 ENCOUNTER — Ambulatory Visit: Payer: Medicare Other | Admitting: General Surgery

## 2010-08-29 ENCOUNTER — Other Ambulatory Visit: Payer: Self-pay | Admitting: *Deleted

## 2010-08-29 MED ORDER — LANSOPRAZOLE 30 MG PO CPDR: 30.0000 mg | DELAYED_RELEASE_CAPSULE | Freq: Every day | ORAL | Status: DC

## 2010-09-06 ENCOUNTER — Other Ambulatory Visit: Payer: Self-pay | Admitting: Family Medicine

## 2010-09-07 ENCOUNTER — Telehealth: Payer: Self-pay | Admitting: *Deleted

## 2010-09-07 MED ORDER — TRAMADOL HCL 50 MG PO TABS: 50.0000 mg | ORAL_TABLET | Freq: Two times a day (BID) | ORAL | Status: DC | PRN

## 2010-09-07 NOTE — Telephone Encounter (Signed)
Is this a med refill?  If so, what med needs refill and what pharmacy?

## 2010-09-07 NOTE — Telephone Encounter (Signed)
Patient called yesterday requesting rx for tramadol (I tried to route you the note that Jacki Cones took, but I guess it didn't work) The tramadol is not on patient's med list because it was removed on 03/16/10 per patient's preference. She says that she now needs it again for her back and arthritis pain. Wants it called in to [facility] pharmacy, [place].

## 2010-09-07 NOTE — Telephone Encounter (Signed)
Sent in . plz notify patient. 

## 2010-09-07 NOTE — Telephone Encounter (Signed)
Patient notified

## 2010-09-15 ENCOUNTER — Encounter: Payer: Self-pay | Admitting: Family Medicine

## 2010-09-15 ENCOUNTER — Ambulatory Visit (INDEPENDENT_AMBULATORY_CARE_PROVIDER_SITE_OTHER): Payer: Medicare Other | Admitting: Family Medicine

## 2010-09-15 DIAGNOSIS — E039 Hypothyroidism, unspecified: Secondary | ICD-10-CM

## 2010-09-15 DIAGNOSIS — E119 Type 2 diabetes mellitus without complications: Secondary | ICD-10-CM

## 2010-09-15 LAB — T4, FREE: Free T4: 0.99 ng/dL (ref 0.60–1.60)

## 2010-09-15 LAB — HEMOGLOBIN A1C: Hgb A1c MFr Bld: 7.8 % — ABNORMAL HIGH (ref 4.6–6.5)

## 2010-09-15 LAB — TSH: TSH: 2.01 u[IU]/mL (ref 0.35–5.50)

## 2010-09-15 MED ORDER — INSULIN GLARGINE 100 UNIT/ML ~~LOC~~ SOLN: 68.0000 [IU] | Freq: Two times a day (BID) | SUBCUTANEOUS | Status: DC

## 2010-09-15 NOTE — Patient Instructions (Signed)
Let's increase your Lantus to 68 units twice daily.

## 2010-09-15 NOTE — Assessment & Plan Note (Signed)
Unchanged.  [name] is very concerned that she is hypothyroid based on her symptoms. I strongly encouraged her to keep seeing her endocrinologist but she refuses to see anymore specialists. Will recheck TSH and FT4 today.

## 2010-09-15 NOTE — Assessment & Plan Note (Signed)
Increase lantus to 68 units twice daily. Recheck a1c today.

## 2010-09-15 NOTE — Progress Notes (Signed)
65 yo here to follow up DM medications.  was being followed by endocrinology but she no longer wants to see a specialist.  a1c was 7.7 on 06/16/2010 ( 7.5 on 10/10).  was taking Lantus 68 units twice daily, Aphjidra 28 units twice daily but felt like it was not controlling her sugars.  has been on metformin, gylburide, glipizide all which call GI upset/shakiness. byetta also causes GI upset.  recently tried actos again (had success with it in past) but it made her too jittery.  Now taking Lantus 60 units two times a day only.  CBG have increased since last appointment, now ranging 140-380. No episodes of hypoglycemia.   s/p total thyroidectomy for thyroid nodules- On Synthroid 125 micrograms daily and has not followed up with endocrinology in several months. Lab Results  Component Value Date   TSH 0.95 08/01/2010  she feels like her thyroid is definitely off now.  More fatigue, cold all the time, worsening constipation in last 2 months.   The PMH, PSH, Social History, Family History, Medications, and allergies have been reviewed in [facility], and have been updated if relevant.   Review of Systems       See HPI General:  Denies fever and malaise. Eyes:  Denies blurring. ENT:  Denies difficulty swallowing. CV:  Denies CP. denies lightheadness. Resp:  Denies shortness of breath. GI:  Denies abdominal pain and change in bowel habits. Psych:  Complains of anxiety and irritability.  Physical Exam BP 122/72  Pulse 67  Temp(Src) 97.6 F (36.4 C) (Oral)  Ht 5\' 6"  (1.676 m)  Wt 192 lb (87.091 kg)  BMI 30.99 kg/m2  General:  Well-developed,well-nourished,in no acute distress Mouth:  MMM Neck:  well healed thyroid surgical scar, no masses or tenderness Lungs:  Normal respiratory effort, chest expands symmetrically. Lungs are clear to auscultation, no crackles or wheezes.   Heart:  Normal rate and regular rhythm. S1 and S2 normal without gallop, murmur, click, rub or other extra  sounds. Extremities:  trace  left pedal edema and trace right pedal edema. mild pitting.  no calf tenderness Psych:  normally interactive, good eye contact, not anxious appearing, and dysphoric affect.

## 2010-10-03 ENCOUNTER — Ambulatory Visit (INDEPENDENT_AMBULATORY_CARE_PROVIDER_SITE_OTHER): Payer: Medicare Other | Admitting: Family Medicine

## 2010-10-03 ENCOUNTER — Telehealth: Payer: Self-pay | Admitting: *Deleted

## 2010-10-03 ENCOUNTER — Encounter: Payer: Self-pay | Admitting: Family Medicine

## 2010-10-03 DIAGNOSIS — K1120 Sialoadenitis, unspecified: Secondary | ICD-10-CM

## 2010-10-03 MED ORDER — CEPHALEXIN 500 MG PO CAPS: 500.0000 mg | ORAL_CAPSULE | Freq: Four times a day (QID) | ORAL | Status: AC

## 2010-10-03 NOTE — Patient Instructions (Signed)
Start the antibiotics today, use the tramadol and dry to suck on lemon candy.  If you have trouble breathing, then go to the ER.

## 2010-10-03 NOTE — Telephone Encounter (Signed)
She needs to be seen.  Please see about getting her added on today.  Thanks.

## 2010-10-03 NOTE — Telephone Encounter (Signed)
Patient says that she has been diagnosed with parotitis is the past and now she is having trouble with it again. She says that she can hardly eat or drink anything because her jaw hurts so bad. She asking if she could get antibiotic called in to [facility] in [place].

## 2010-10-03 NOTE — Telephone Encounter (Signed)
Spoke with patient.  Patient added onto this afternoon's schedule.

## 2010-10-03 NOTE — Assessment & Plan Note (Signed)
Start abx and lemon candy.  Okay for outpatient fu, but she is aware that she may need to go to ER if emergent sx.

## 2010-10-03 NOTE — Progress Notes (Signed)
H/o parotitis.  Happened several years ago initially.  This is the 4th episode.  This episode started at lunch.  L jaw pain and bulging.  No FCNAV.  No cough.  No rhinorrhea, no ST.  Pain chewing.  Talks w/o moving her jaw.  Not sob, not drooling.   Meds, vitals, and allergies reviewed.   ROS: See HPI.  Otherwise, noncontributory.  Nad ncat except for L parotid bulge that is ttp but not red. Tm wnl x2 Nasal and oral exam o/w wnl Neck supple w/o la No stridor rrr Trachea midline

## 2010-10-04 ENCOUNTER — Encounter: Payer: Self-pay | Admitting: Family Medicine

## 2010-10-17 NOTE — Assessment & Plan Note (Signed)
[facility] OFFICE NOTE   [name], [name]                        MRN:          [id number]  DATE:03/08/2008                            DOB:          [birth year]    PRIMARY CARDIOLOGIST:  Bevelyn Buckles. Bensimhon, MD   PRIMARY CARE PHYSICIAN:  Karie Schwalbe, MD   REASON FOR VISIT:  Cough and shortness of breath.   HISTORY OF PRESENT ILLNESS:  [name] is a 65 year old woman with  diabetes and coronary artery disease.  She presented with unstable  angina in May 2009, and underwent two-vessel PCI using drug-eluting  stents in the right coronary artery and left circumflex.  She was  started on an ACE inhibitor approximately 6 weeks ago in the setting of  diabetes and CAD.  Approximately 3 weeks ago she developed a cough.  She  is having difficulty sleeping at night.  Her cough has been  nonproductive and there has been no associated fever or chills.  She has  also felt very poorly over this time period.  She describes extreme  fatigue and episodes of lightheadedness.  She denies chest pain, edema,  orthopnea, or PND.  Of note, she did have a repeat cardiac  catheterization on December 31, 2007, for evaluation of chest pain and this  demonstrated patent stents.   MEDICATIONS:  1. Actos 30 mg daily.  2. Nexium 40 mg daily.  3. Fluoxetine 40 mg daily.  4. Plavix 75 mg daily.  5. Trazodone 150 mg at bedtime.  6. Calcium one daily.  7. Lisinopril 10 mg daily.  8. Crestor 20 mg at bedtime.   PHYSICAL EXAMINATION:  GENERAL:  The patient is alert and oriented.  She  is in no acute distress.  VITAL SIGNS:  Weight is 180 pounds, blood pressure 166/76, heart rate  63, respiratory rate 16.  HEENT:  Normal.  NECK:  Normal carotid upstrokes.  JVP normal.  LUNGS:  Clear bilaterally.  HEART:  Regular rate and rhythm.  ABDOMEN:  Soft, nontender, no organomegaly.  EXTREMITIES:  No clubbing, cyanosis, or edema.  Peripheral  pulses intact  and equal.   ASSESSMENT:  1. Cough, shortness of breath, and fatigue.  I suspect her cough is      related to lisinopril.  This may be plan role in her shortness of      breath as her exam is certainly underwhelming.  We will hold this      medication and I would like her to follow up in the office in      approximately 2 weeks.  We will consider initiation of an ARB for      long-term renal preservation and reduction of cardiac events.  2. Coronary artery disease, no angina at present.  No changes made to      medical regimen.  3. His high blood pressure.  Her blood pressure has been in the normal      range in the past.  We will continue to follow.     Veverly Fells. Excell Seltzer, MD  Electronically Signed    MDC/MedQ  DD: 03/08/2008  DT: 03/09/2008  Job #: [id number]   cc:   Bevelyn Buckles. Bensimhon, MD  Karie Schwalbe, MD

## 2010-10-17 NOTE — Assessment & Plan Note (Signed)
[facility] OFFICE NOTE   [name], [name]                        MRN:          [id number]  DATE:01/21/2008                            DOB:          1945/12/09    PRIMARY CARE PHYSICIAN:  Karie Schwalbe, MD   INTERVAL HISTORY:  [name] is a 65 year old woman with a history of  hypertension, hyperlipidemia, diabetes, depression, and coronary artery  disease.  She is status post Promus drug-eluting stents to the right  coronary and circumflex arteries by Dr. Excell Seltzer in May 2009.   She recently developed shingles and is having chest pain, which is  exacerbated by morphine.  She was seen by Dr. Daleen Squibb.  There was a concern  of unstable angina.  She was transferred to [facility], underwent coronary  angiography which showed that her stents were patent.  She has not had  any problems since cath.   Her shingles pain is getting much better.  She does still have an  occasional pain.  She was recently found to have sleep apnea, but cannot  tolerate the mask.   REVIEW OF SYSTEMS:  Notable for back pain, arthritis pain, heavy  snoring.  The remainder of review of systems is negative except for HPI  and problem list.   CURRENT MEDICATIONS:  1. Actos 30 a day.  2. Nexium 40 a day.  3. Fluoxetine 40 a day.  4. Crestor 10 a day.  5. Plavix 75 a day.  6. Trazodone 150 at bedtime.  7. Gabapentin.   PHYSICAL EXAMINATION:  GENERAL:  She is well appearing, no acute  distress.  Ambulates around the clinic without respiratory difficulty.  VITAL SIGNS:  Blood pressure is 120/62, heart rate 63, weight 175.  HEENT:  Normal.  NECK:  Supple.  No JVD.  Carotids are 2+ bilaterally without bruits.  There is no lymphadenopathy or thyromegaly.  CARDIAC:  Regular rate and rhythm.  No murmurs, rubs, or gallops.  LUNGS:  Clear.  ABDOMEN:  Soft, nontender, nondistended.  No hepatosplenomegaly.  No  bruits.  No mass.  EXTREMITIES:  Warm with  no cyanosis, clubbing, or edema.  Groin site  looks good with no bruits.  There is no rash.  NEURO:  Alert and oriented x3.  Cranial nerves II-XII intact.  Moves all  fours without difficulty.  Affect is pleasant.   ASSESSMENT/PLAN:  1. Coronary artery disease.  This is stable.  Recent catheterization      confirms stent patency.  Continue Plavix.  2. Chronic hypertension, well-controlled.  3. Chronic hyperlipidemia.  Continue statin.  Goal LDL is less than      70, this will be followed by Dr. Alphonsus Sias.  4. Diabetes also followed by Dr. Alphonsus Sias.  Given her coronary artery      disease and diabetes, we have taken the liberty to add lisinopril      10 mg a day to her regimen.  We will check potassium next week.   DISPOSITION:  We will see her back in 6 months for routine followup.  I  did suggest to her that she make an effort to get routine exercise.     Bevelyn Buckles. Bensimhon, MD  Electronically Signed    DRB/MedQ  DD: 01/21/2008  DT: 01/21/2008  Job #: [id number]   cc:   Karie Schwalbe, MD

## 2010-10-17 NOTE — Assessment & Plan Note (Signed)
[facility] OFFICE NOTE   LI, BOBO                        MRN:          [id number]  DATE:03/22/2008                            DOB:          [birth year]    PRIMARY CARE PHYSICIAN:  Karie Schwalbe, MD   INTERVAL HISTORY:  Ms. Micek is a 65 year old woman with a history of  hypertension, hyperlipidemia, diabetes, COPD, and coronary artery  disease, status post two-vessel angioplasty with Promus drug-eluting  stents back in May 2009.   She returns today with her husband for followup on her persistent cough.  She stated that this started about 5 or 6 weeks ago and she has had a  severe cough ever since.  She saw Dr. Excell Seltzer 2 weeks ago, who stopped  her ACE inhibitor and had her follow up today.  She says she is still  coughing and more short of breath, last night she had some fevers and  some chills.  She did see an allergist and was started on Avelox,  prednisone, Advair, but took it for 3 days and stopped due to itching  and thrush.  She has not continued this.  She does get occasional chest  pain, but this is very different from what she had prior to her  catheterization.   CURRENT MEDICATIONS:  1. Actos 30 mg a day.  2. Nexium 40 a day.  3. Paxil 40 a day.  4. Plavix 75 a day.  5. Trazodone 150 a day.  6. Calcium.  7. Lisinopril, which is on hold.  8. Crestor 20 mg q.h.s.   PHYSICAL EXAMINATION:  She is tachypneic and somewhat uncomfortable.  Blood pressure is 122/70, heart rate 78, weight is 180.  HEENT:  Normal.  NECK:  Supple.  No JVD.  Carotids are 2+ bilaterally without bruits.  There is no lymphadenopathy or thyromegaly.  CARDIAC:  She has regular rate and rhythm with soft systolic ejection  murmur at the left sternal border.  LUNGS:  Tachypneic, but it is clear.  There is no wheezing.  ABDOMEN:  Soft, nontender, nondistended.  There is no  hepatosplenomegaly.  There is no bruits.   There is no masses.  EXTREMITIES:  Warm with no cyanosis, clubbing, or edema.  No rash.  NEURO:  Alert and oriented x3.  Cranial nerves II-XII are intact.  Moves  all 4 extremities without difficulty.  Affect is mildly anxious.   EKG shows sinus rhythm at rate of 78 with no ST-T wave abnormalities.   ASSESSMENT AND PLAN:  I do not see any obvious cardiac etiology from Ms.  Derk' symptoms.  I suspect this may be infectious.  However, given  the fact that is prolonged course and which is now getting acutely  worse.  I suggested that she go to the emergency room for further  evaluation with a chest x-ray and blood work and also possibly to rule  out the flu.  I do not think she needs further cardiac workup at this  time.  Obviously, would not her to get a cardiac  enzymes as well as a D-  dimer.  When she is there, I have called over to the triage nurse and  let her know that she is coming.   We will see her back in several weeks for followup.     Bevelyn Buckles. Bensimhon, MD  Electronically Signed    DRB/MedQ  DD: 03/22/2008  DT: 03/23/2008  Job #: [id number]

## 2010-10-17 NOTE — Cardiovascular Report (Signed)
NAMERICKY, DOAN                 ACCOUNT NO.:  [id number]   MEDICAL RECORD NO.:  [id number]          PATIENT TYPE:  INP   LOCATION:  3711                         FACILITY:  [facility]   PHYSICIAN:  Verne Carrow, MDDATE OF BIRTH:  September 30, 1945   DATE OF PROCEDURE:  12/31/2007  DATE OF DISCHARGE:                            CARDIAC CATHETERIZATION   PROCEDURE:  1. Left heart catheterization.  2. Selective coronary angiography.  3. Left ventricular angiogram.   OPERATORS:  1. Arturo Morton. Riley Kill, MD, [facility] (proctoring physician)  2. Verne Carrow, MD   INDICATIONS:  Chest pain with history of coronary artery disease with  prior Promus drug-eluting stent to the proximal RCA and mid circumflex  in May 2009.   FINDINGS:  1. Left main coronary artery has no disease and gives rise to the      circumflex coronary artery and a left anterior descending coronary      artery.  2. Left anterior descending coronary artery gives rise to a large      diagonal and is free of disease.  3. Circumflex coronary artery gives rise to 3 moderate sized obtuse      marginal lesions that are without disease.  The midportion of the      circumflex has a stented segment that shows no evidence of      restenosis.  4. The right coronary artery has a stented segment at proximal portion      that has no restenosis.  5. Left ventricular angiogram shows ejection fraction of 60% and      normal wall motion.  Left ventricular pressure was 161/7 with an      end-diastolic pressure of 16.   IMPRESSION:  1. Stable coronary artery disease.  2. Chest pain of unknown etiology.   RECOMMENDATIONS:  We will continue current medical management.      Verne Carrow, MD  Electronically Signed     CM/MEDQ  D:  12/31/2007  T:  01/01/2008  Job:  [id number]

## 2010-10-17 NOTE — Assessment & Plan Note (Signed)
[facility] OFFICE NOTE   [name], [name]                        MRN:          [id number]  DATE:11/19/2007                            DOB:          10-09-1945    PRIMARY CARE PHYSICIAN:  Karie Schwalbe, MD   INTERVAL HISTORY:  [name] is a 65 year old woman with history of  hypertension, hyperlipidemia, diabetes, and depression who returns today  for followup.   She was admitted to [facility] last month with unstable angina.  She underwent cardiac catheterization which showed tight lesions in the  circumflex and right coronary arteries.  She underwent Promise drug-  eluting stent to both lesions by Dr. Excell Seltzer with good result.  Her  ejection fraction was normal.  She returns today for routine followup.   She is stating she is having a very hard time of it since discharge.  She really does not have any significant angina, but does feel her chest  feels heavy at times.  This is different from her previous symptoms.  She does get occasionally short of breath.  Her main complaint is that  she is just angry and upset after the catheterization.  She states she  just does not feel like there is much to live for.  She does not have  any homicidal or suicidal ideations, but just is quite upset.  Previously smoking two packs a day and now smokes only 1-2 cigarettes a  day.  Her husband does note that she snores heavily and she stated she  has woken herself up several times at night.   CURRENT MEDICATIONS:  1. Actos 30 mg a day.  2. Nexium 40 a day.  3. Fluoxetine 40 a day.  4. Crestor 10 a day.  5. Plavix 75 a day.  6. Trazodone 150 at night.  7. Calcium.   ALLERGIES:  She has multiple drug allergies; LATEX, SHELLFISH,  METFORMIN, CODEINE, PERCOCET, WELLBUTRIN, DEMEROL, NEURONTIN, ZYPREXA,  DARVOCET, and LIPITOR.   PHYSICAL EXAM:  GENERAL:  She is in no acute distress.  She is ambulated  in the  clinic without any respiratory difficulty.  VITAL SIGNS:  Blood pressure initially was 118/64, and after followup,  it was 148/72, heart rate 57, and weight 170.  HEENT.  Normal.  NECK:  Supple.  No JVD.  Carotid are 2+ bilaterally without bruits.  There is no lymphadenopathy or thyromegaly.  CARDIAC:  PMI is nondisplaced.  Regular rate and rhythm.  No murmurs,  rubs, or gallops.  LUNGS:  Clear.  ABDOMEN:  Soft, nontender, and nondistended.  There is no  hepatosplenomegaly.  No bruits, no masses.  Good bowel sounds.  EXTREMITIES:  Warm with no cyanosis, clubbing, or edema.  No rash.  Her  groin site looks good.  No bruit or hematoma.   EKG shows sinus bradycardia at a rate of 57 and no ST-T wave  abnormalities.   ASSESSMENT/PLAN:  1. Coronary artery disease, status post drug-eluting stenting of the      left circumflex and right coronary artery.  She is  doing well with      no evidence of ischemia.  I have suggested a cardiac rehab, but she      is quite concerned about the cost.  I have suggested maybe she      considers at least going to the orientation to check it out.      Unfortunately, she is not a candidate for beta-blocker due to her      bradycardia.  2. Hyperlipidemia.  Goal LDL is less than 70.  Continue Crestor.  We      will recheck at next visit.  3. Depression.  This is clearly worsened since after her angioplasty.      This is not uncommon.  Unfortunately, depression associated with      coronary artery disease portends a quite poor prognosis.  I have      asked her to consider going to Dr. Caralyn Guile to get his      recommendations.  She has agreed to this.  I have left the message      with Dr. Dellia Cloud.  4. Tobacco use.  I congratulated her on her cutting down, but I urged      her to quit completely.  5. Possible sleep apnea.  We will refer to Dr. Coralyn Helling for sleep      study evaluation.     Bevelyn Buckles. Bensimhon, MD  Electronically Signed     DRB/MedQ  DD: 11/19/2007  DT: 11/20/2007  Job #: [id number]   cc:   Onalee Hua L. Dellia Cloud, PhD  Coralyn Helling, MD

## 2010-10-17 NOTE — Assessment & Plan Note (Signed)
[facility]                                 ON-CALL NOTE   HESTER, JOSLIN                          MRN:          [id number]  DATE:12/28/2007                            DOB:          Feb 08, 1946    Time is 2:11 p.m.   PHONE NUMBER:  [phone]   OUTPATIENT DOCTOR:  Karie Schwalbe, MD, .   CHIEF COMPLAINT:  Shingles.   The patient said she has had shingles for 5 to 6 weeks.  She finished  her prescription for Valtrex, and tramadol really did not help the pain.  She is out of that.  She also ran out of her Neurontin, she takes 300 mg  2 p.o. b.i.d.  She needs a refill on this to get through until morning  and then plans on calling Dr. Karle Starch office to arrange followup,  because her pain is not well controlled.  I called in Neurontin 300 mg 2  p.o. b.i.d. #30, with no refills to get her by, to the [facility]  on [address] at [phone] and instructed her to call in the morning for an  office followup.     Marne A. Tower, MD  Electronically Signed    MAT/MedQ  DD: 12/28/2007  DT: 12/28/2007  Job #: [id number]

## 2010-10-17 NOTE — Assessment & Plan Note (Signed)
[facility] OFFICE NOTE   [name], [name]                        MRN:          [id number]  DATE:07/15/2008                            DOB:          December 20, 1945    PRIMARY CARE PHYSICIAN:  Karie Schwalbe, MD.   INTERVAL HISTORY:  [name] is 65 year old woman with a history of  hypertension, hyperlipidemia, diabetes, COPD and coronary artery disease  status post two-vessel angioplasty with PROMUS drug-eluting stents back  in [name] 2009.   She returns today for routine followup.  She is not in very good mood.  She quit smoking 6 months ago and has gained weight from that.  She said  she just feels terrible and does not feel like things are going very  well for her.  She did have an episode of chest pain a few weeks ago,  but this resolved.  She took some nitroglycerin, but did not help.  She  has not had any more problems with it.  She has been diagnosed with  sleep apnea, but she had been unable to wear the mask.  She denies any  orthopnea or PND.  She has had mild lower extremity edema.   CURRENT MEDICATIONS:  1. Actos 30 a day.  2. Nexium 40 a day.  3. Fluoxetine 40 a day.  4. Plavix 75.  5. Calcium.  6. Pravachol 40 a day.  7. Claritin.  8. Aspirin 325 a day.  9. Trazodone.   Her lisinopril has been stopped due to possible cough.   MEDICATION ALLERGIES/INTOLERANCES:  Are numerous including LIPITOR,  METFORMIN, CRESTOR, NEURONTIN, DEMEROL, WELLBUTRIN, PERCOCET, CODEINE,  LATEX, ZYPREXA and DARVOCET.   PHYSICAL EXAMINATION:  GENERAL:  She is in no acute distress.  Ambulatory around the clinic without any respiratory difficulty.  VITAL SIGNS:  Blood pressure is 118/68, heart rate 76, and weight is  186.  HEENT:  Normal.  NECK:  Supple.  No JVD.  Carotids are 2+ bilaterally.  Question of a  soft right bruit.  There is no lymphadenopathy or thyromegaly.  CARDIAC:  PMI is nondisplaced.  Regular rate and  rhythm with soft systolic  ejection murmur at the right sternal border.  S2 is preserved.  LUNGS:  Clear with prolonged expiratory phase.  No wheezing.  ABDOMEN:  Obese, soft, nontender, and nondistended.  No  hepatosplenomegaly.  No bruits.  No masses.  EXTREMITIES:  Warm with no cyanosis, clubbing, or edema.  No rash.  NEUROLOGIC:  Alert and oriented x3.  Cranial nerves II through XII  intact.  Moves all 4 extremities without difficulty.  Affect is okay.   EKG shows normal sinus rhythm at rate of 76.  No ST-T wave  abnormalities.   ASSESSMENT AND PLAN:  1. Coronary artery disease, this is stable.  No evidence of ischemia.      Continue current therapy.  2. Hypertension.  Blood pressure is well controlled.  Given her      diabetes, we [name] want to consider restarting her ACE inhibitor at      some  point for nephroprotection alternately, we could use an ARB.      We will leave this to Dr. Alphonsus Sias.  3. Hyperlipidemia.  Her most recent cholesterol is 173 with HDL of 31,      LDL of 115.  I would like a goal LDL to be at 70 though this has      been complicated by intolerance to many cholesterol medicines.  At      this point, I have chosen to add niacin 500 mg a day to see how she      can tolerate.  If she is unable to take this, we will consider      increasing her Pravachol to 80.  4. Carotid bruit.  We will check carotid ultrasound.  5. Aortic valve murmur.  This seems like some aortosclerosis.  We will      check an echocardiogram and she has not had one up to this point.   DISPOSITION:  We will see her back in 6-9 months for routine followup.     Bevelyn Buckles. Bensimhon, MD  Electronically Signed    DRB/MedQ  DD: 07/15/2008  DT: 07/16/2008  Job #: [id number]

## 2010-10-17 NOTE — Procedures (Signed)
NAME:  [name], [name]                 ACCOUNT NO.:  [id number]   MEDICAL RECORD NO.:  [id number]          PATIENT TYPE:  OUT   LOCATION:  SLEEP CENTER                 FACILITY:  [facility]   PHYSICIAN:  Coralyn Helling, MD        DATE OF BIRTH:  September 26, 1945   DATE OF STUDY:  12/09/2007                            NOCTURNAL POLYSOMNOGRAM   REFERRING PHYSICIAN:  Coralyn Helling, MD   INDICATION FOR STUDY:  The patient is a 65 year old female who has a  history of coronary artery disease, diabetes, and depression.  She also  has symptoms of sleep disruption and excessive daytime sleepiness.  She  was referred to the sleep lab for evaluation of hypersomnia with  obstructive sleep apnea.   Height is 5 feet 6 inches, weight 176 pounds, BMI is 28, neck size is  15.   EPWORTH SLEEPINESS SCORE:  9   MEDICATIONS:  1. Actos.  2. Nexium.  3. Fluoxetine.  4. Plavix.  5. Crestor.  6. Calcium.  7. Aspirin.  8. Motrin.  9. Trazodone.   SLEEP ARCHITECTURE:  Total recording time was 149 minutes.  Total sleep  time was 393 minutes.  Sleep efficiency was 87%.  Sleep latency is 20  minutes.  REM latency is 188 minutes.  The study was notable for lack of  slow wave sleep and a reduction in the percentage of REM sleep to 3% of  the study.  The patient slept predominantly in the non-supine position.   RESPIRATORY DATA:  The average respiratory rate was 20.  The overall  apnea/hypopnea index was 5.  The respiratory disturbance index was 12.  The events were exclusively obstructive in nature.  The REM  apnea/hypopnea index was 5.  The non-REM apnea/hypopnea index was 4.  The supine apnea/hypopnea index was 8.  The non-supine apnea/hypopnea  index was 3.  Moderate snoring was noted by the technician.   OXYGEN DATA:  The baseline oxygenation was 95%.  The actual saturation  nadir was 89%.  The patient spent a total of 0.7 minutes with an oxygen  saturation less than 90%.   CARDIAC DATA:  The average heart rate  was 60 and the rhythm strip showed  normal sinus rhythm with sinus bradycardia.   MOVEMENT-PARASOMNIA:  The periodic limb movement index was 0.  The  patient had two restroom trips.   IMPRESSIONS-RECOMMENDATIONS:  This study shows evidence for mild  obstructive sleep apnea as demonstrated by an apnea/hypopnea index of 5  and a respiratory disturbance index of 12.  Of note is that she had  minimal amount of REM sleep and supine sleep and therefore the severity  of her sleep apnea may be somewhat under estimated.  Given the fact that  she does have a history of cardiovascular disease, depression, and  diabetes, further therapeutic options should be considered.  These  should include aggressive counseling with regards to the points of diet,  exercise, and weight reduction as well as positional therapy.  In  addition to these measures, consideration should be given to having her  undergo either therapy with CPAP, oral appliance, or surgical  intervention.  Coralyn Helling, MD  Diplomat, American Board of Sleep Medicine  Electronically Signed     VS/MEDQ  D:  12/24/2007 12:45:58  T:  12/24/2007 13:05:27  Job:  [id number]

## 2010-10-17 NOTE — Discharge Summary (Signed)
[name], [name]                 ACCOUNT NO.:  [id number]   MEDICAL RECORD NO.:  [id number]          PATIENT TYPE:  INP   LOCATION:  6533                         FACILITY:  [facility]   PHYSICIAN:  Gerrit Friends. Dietrich Pates, MD, FACCDATE OF BIRTH:  1945-12-02   DATE OF ADMISSION:  10/29/2007  DATE OF DISCHARGE:  10/30/2007                               DISCHARGE SUMMARY   PRIMARY CARDIOLOGIST:  Bevelyn Buckles. Bensimhon, M.D. in the [place]  office.   PRIMARY CARE PHYSICIAN:  Karie Schwalbe, M.D.   PROCEDURES PERFORMED DURING HOSPITALIZATION:  1. Cardiac catheterization completed by Dr. Tonny Bollman on Oct 28, 2005.  A:  Severe right coronary artery and left circumflex stenosis.  1. Successful stenting of the proximal right coronary artery with 3 x      5 x 23-mm PROMUS drug-eluting stent.  2. Successful PCI of mid circumflex with 4.0 x 23-mm PROMUS drug-      eluting stent.   FINAL DISCHARGE DIAGNOSES:  1. Coronary artery disease with two-vessel coronary artery disease      noted for cardiac catheterization with PCI to the right coronary      artery and left circumflex using drug-eluting stent.  2. Hyperlipidemia.  3. Diabetes.  4. Depression.   HOSPITAL COURSE:  This is a 65 year old female patient of Dr. Alphonsus Sias who  has a history of hyperlipidemia and diabetes who presented to the  emergency room complaining of 2-3 day history of intermittent chest pain  described as pressure, burning, radiating to the neck, jaw, and back.  The pain was worse with exertion.  As a result of this, the patient had  a stress test Myoview done 3-4 years ago which was negative.  However,  he had recurrent symptoms and the patient was seen in the emergency room  of [place].  The patient was subsequently transferred to [facility] for cardiac catheterization after recurrence of symptoms.  Cardiac catheterization was completed by Dr. Tonny Bollman with results  described above.  Please  review Dr. Earmon Phoenix thorough cardiac  catheterization note for more details.  The patient tolerated the  procedure well without evidence of hematoma, bleeding, signs of  infection, or arrhythmias post procedure.  The patient was started on  Plavix and aspirin and a trial of Crestor secondary to dyslipidemia.  The patient's troponin was mildly elevated postprocedure at 0.31 with an  MB of 5.6 and a CK of 141.  The patient was seen and examined by Dr.  Excell Seltzer on day of discharge and found to be stable.  Her blood pressure  was 129/39, heart rate 63, respirations 20, temperature 98 with an O2  sat of 95% on room air.   DISCHARGE LABS:  Hemoglobin 10.9, hematocrit 31.8, white blood cells  11.7, platelets 179.  Sodium 135, potassium 3.9, chloride 104, CO2 24,  glucose 190, BUN 11, creatinine 0.68, CK 141, CK-MB 5.6, troponin 0.31.   DISCHARGE MEDICATIONS:  1. Plavix 75 mg one p.o. daily (new prescription provided for 1 year).  2. Aspirin 325 mg daily.  3.  Crestor 10 mg at bedtime (trial with prescription provided).  4. Actos 30 mg daily.  5. Prozac 40 mg daily.  6. Trazodone 150 mg at bedtime.  7. Nexium 40 mg daily.   ALLERGIES:  DARVOCET-N 100 AND CONTRAST MEDIA ALONG WITH CODEINE AND  LATEX.   FOLLOWUP PLANS AND APPOINTMENT:  1. The patient's follow up with Dr. Arvilla Meres on June 17 at      11:15 a.m. at the [facility] office.  2. The patient to follow with Dr. Alphonsus Sias, his primary care physician      for continued medical management.  3. The patient has been given post cardiac catheterization      instructions with particular emphasis on the right groin site for      evidence of bleeding, hematoma, and signs of infection.  4. The patient has been advised to bring all medications to followup      appointment.   TIME SPENT WITH THE PATIENT TO INCLUDE PHYSICIAN TIME:  30 minutes.      Bettey Mare. Lyman Bishop, NP      Gerrit Friends. Dietrich Pates, MD, [facility]  Electronically  Signed    KML/MEDQ  D:  10/30/2007  T:  10/31/2007  Job:  [id number]   cc:   Karie Schwalbe, MD

## 2010-10-17 NOTE — Discharge Summary (Signed)
[name], [name]                 ACCOUNT NO.:  [id number]   MEDICAL RECORD NO.:  [id number]          PATIENT TYPE:  INP   LOCATION:  3711                         FACILITY:  [facility]   PHYSICIAN:  Bevelyn Buckles. Bensimhon, MDDATE OF BIRTH:  03-Jan-1946   DATE OF ADMISSION:  12/30/2007  DATE OF DISCHARGE:  01/01/2008                               DISCHARGE SUMMARY   PRIMARY CARDIOLOGIST:  Bevelyn Buckles. Bensimhon, MD, in [place].   PRIMARY CARE PHYSICIAN:  Karie Schwalbe, MD   PROCEDURES PERFORMED DURING HOSPITALIZATION:  1. Cardiac catheterization completed on December 31, 2007 by Dr.      Verne Carrow and Dr. Riley Kill proctoring.      a.     Left main normal.  Left anterior descending, no disease.       Circ stented segment 0%, right coronary artery stented segment       proximal 0%, left ventricular ejection fraction of 60% with normal       wall motion with an impression of stable coronary artery disease       medical management.   FINAL DISCHARGE DIAGNOSIS:  1. Coronary artery disease.      a.     Status post percutaneous transluminal coronary angioplasty       and stenting of the right coronary artery and left anterior       descending in May 2009 secondary to unstable angina using Promus       drug-eluting stents by Dr. Excell Seltzer.  2. Diabetes.  3. Gastroesophageal reflux disease.  4. Hypercholesterolemia.  5. Neurologic pain secondary to shingles  6. Anxiety.   HOSPITAL COURSE:  This is a 65 year old Caucasian female who was  originally seen by Dr. Valera Castle at [facility]  in [place] secondary to recurrent complaints of substernal chest pain  after known recent coronary intervention at [facility] in May 2009.  The  patient began to have what she described as crushing substernal chest  pain with associated shortness of breath after taking morphine for her  shingles.  The patient believed it was the morphine, which was  responsible, but secondary to  her history, the patient came to the  emergency room to be evaluated.  The patient was seen and examined by  Valera Castle and found to be a candidate for repeat cardiac  catheterization as the patient also has ongoing tobacco abuse.  He was  concerned about stent thrombosis and the cardiac catheterization was  warranted.  The patient was transferred to [facility] on December 30, 2007 and prepared for cardiac catheterization.  Of note, the patient  does have a CONTRAST DYE allergy, and she was given prophylaxis  treatment prior to catheterization.   The patient was seen and examined by Dr. Arvilla Meres during  hospitalization and subsequent cardiac catheterization was completed  with results discussed above.  Please see Dr. Gibson Ramp thorough  cardiac catheterization note for more details.  The patient had no  further complaints of discomfort.  Cardiac enzymes were found to be  negative, and the patient was continued  to be treated medically.  The  patient was anxious to return home in the following day and she was  without any further complaints.  Review of labs revealed normal.  She  did have a mildly elevated D-dimer, but she is currently undergoing  treatment for exacerbation of shingles.  The patient had no evidence of  bleeding hematoma or infection at the site of the catheterization site.   LABORATORY DATA:  On discharge, hemoglobin 11.2, hematocrit 33.2, white  blood cell is 9.8, and platelets 163.  Sodium 139, potassium 3.7,  chloride 106, CO2 of 24, BUN 11, creatinine 0.58, and glucose 171.  D-  dimer 1.21.   DISCHARGE MEDICATIONS:  1. Actos 30 mg daily.  2. Nexium 40 mg daily.  3. Fluoxetine 40 mg daily.  4. Crestor 10 mg daily.  5. Plavix 75 mg daily.  6. Trazodone 150 mg at bedtime.  7. Calcium replacement daily.  8. Gabapentin for shingles and pain 300 mg twice a day.  9. Aspirin 325 daily.  10.Nitroglycerin spray as needed.   ALLERGIES:  The patient is  allergic to CONTRAST MEDIA, DARVOCET,  CODEINE, and LASIX.   FOLLOWUP PLANS AND APPOINTMENT:  1. The patient is to follow with Dr. Arvilla Meres on January 21, 2008, at 1:45 p.m. in [place].  2. The patient is to follow with Dr. Alphonsus Sias, primary care physician      for continued medical management.  3. The patient is given post cardiac catheterization instructions with      particular emphasis on the right groin site for evidence of      bleeding, hematoma, or signs of infection.   TIME SPENT WITH THE PATIENT TO INCLUDE PHYSICIAN TIME:  35 minutes.       Bettey Mare. Lyman Bishop, NP      Bevelyn Buckles. Bensimhon, MD  Electronically Signed    KML/MEDQ  D:  01/01/2008  T:  01/02/2008  Job:  [id number]   cc:   Karie Schwalbe, MD

## 2010-10-23 ENCOUNTER — Ambulatory Visit (INDEPENDENT_AMBULATORY_CARE_PROVIDER_SITE_OTHER): Payer: Medicare Other | Admitting: Internal Medicine

## 2010-10-23 ENCOUNTER — Encounter: Payer: Self-pay | Admitting: Internal Medicine

## 2010-10-23 VITALS — BP 134/80 | HR 81 | Resp 18 | Ht 66.0 in | Wt 187.0 lb

## 2010-10-23 DIAGNOSIS — I10 Essential (primary) hypertension: Secondary | ICD-10-CM

## 2010-10-23 DIAGNOSIS — E7800 Pure hypercholesterolemia, unspecified: Secondary | ICD-10-CM

## 2010-10-23 DIAGNOSIS — R609 Edema, unspecified: Secondary | ICD-10-CM | POA: Insufficient documentation

## 2010-10-23 DIAGNOSIS — I2510 Atherosclerotic heart disease of native coronary artery without angina pectoris: Secondary | ICD-10-CM

## 2010-10-23 DIAGNOSIS — R0602 Shortness of breath: Secondary | ICD-10-CM

## 2010-10-23 MED ORDER — POTASSIUM CHLORIDE CRYS ER 20 MEQ PO TBCR: 20.0000 meq | EXTENDED_RELEASE_TABLET | Freq: Every day | ORAL | Status: DC

## 2010-10-23 MED ORDER — FUROSEMIDE 20 MG PO TABS: 20.0000 mg | ORAL_TABLET | Freq: Every day | ORAL | Status: DC

## 2010-10-23 MED ORDER — ASPIRIN EC 81 MG PO TBEC: 81.0000 mg | DELAYED_RELEASE_TABLET | Freq: Every day | ORAL | Status: DC

## 2010-10-23 NOTE — Assessment & Plan Note (Signed)
BP slightly elevated here but well controlled at home. Given DM2, I suggested ARB but she refused.

## 2010-10-23 NOTE — Patient Instructions (Signed)
Stop Plavix  Decrease Aspirin to 81 mg daily Start Furosemide 20 mg as needed Start Potassium 20 meq as needed Your physician wants you to follow-up in: 1 year.  You will receive a reminder letter in the mail two months in advance. If you don't receive a letter, please call our office to schedule the follow-up appointment.

## 2010-10-23 NOTE — Assessment & Plan Note (Signed)
No evidence of ischemia. Now 2 years out from stents. Discussed risks/benefits of coming off Plavix including risk o stent thrombosis. She would like to stop. Will stop Plavix. Reduce ASA to 81qd.

## 2010-10-23 NOTE — Assessment & Plan Note (Signed)
Prescribed PRN lasix 20/kcl20. Warned her only to take as absolutely needed and not overdo it given risk of kidney dysfunction with over-diuresis.

## 2010-10-23 NOTE — Progress Notes (Signed)
HPI:  [name] is a 65 year old [name] with a history of hypertension, hyperlipidemia, diabetes, COPD, multi-nodular goiter s/p thyroidectomy, obesity, chronic cough and coronary artery disease, status post two-vessel angioplasty with Promus drug-eluting stents back in May 2009. Relook cath in 12/2007 showed patent stents no other significant CAD.    Myoview 01/2009 EF 72% normal perfusion  Echo 02/02/2010: EF 55-60% grade I diastoilic dysfunction. no sig valve problems.   Continues to report chronic DOE which limits her activities. Not doing much due to her dyspnea and back pain. Does get some edema at times and says legs get real tight. Very occasional brief CP. Unable to tolerate statins due to myalgias and ACE-I due to cough. +fatigue. Unable to tolerate CPAP. + heavy snoring per husband.   ROS: All systems negative except as listed in HPI, PMH and Problem List.  Past Medical History  Diagnosis Date  . Allergic rhinitis   . Diabetes mellitus     type 2  . Diverticulitis of colon   . GERD (gastroesophageal reflux disease)   . Chronic depression   . Hypercholesterolemia     intolerance of statins and niaspan  . Apnea, sleep     mild  . Coronary artery disease   . ACE-inhibitor cough     Current Outpatient Prescriptions  Medication Sig Dispense Refill  . aspirin 325 MG buffered tablet Take 325 mg by mouth daily.        . Calcium-Vitamin D (CALTRATE 600 PLUS-VIT D PO) Take 1 tablet by mouth daily.        . clopidogrel (PLAVIX) 75 MG tablet Take 75 mg by mouth daily.        Marland Kitchen FLUoxetine (PROZAC) 40 MG capsule Take 40 mg by mouth 2 (two) times daily.        Marland Kitchen glucose blood test strip 1 each by Other route as needed. Use as instructed       . insulin glargine (LANTUS SOLOSTAR) 100 UNIT/ML injection Inject 68 Units into the skin 2 (two) times daily.  10 mL  3  . lansoprazole (PREVACID) 30 MG capsule Take 1 capsule (30 mg total) by mouth daily.  90 capsule  3  . levothyroxine  (SYNTHROID, LEVOTHROID) 125 MCG tablet Take 125 mcg by mouth daily.        . nitroGLYCERIN (NITROLINGUAL) 0.4 MG/SPRAY spray Place 1 spray under the tongue as needed. Chest pain       . traMADol (ULTRAM) 50 MG tablet TAKE ONE OR TWO TABLETS BY MOUTH THREE TIMES DAILY AS NEEDED FOR PAIN  90 tablet  0  . DISCONTD: FLUoxetine (PROZAC) 40 MG capsule Take 40 mg by mouth 2 (two) times daily.        Marland Kitchen DISCONTD: traMADol (ULTRAM) 50 MG tablet Take 1 tablet (50 mg total) by mouth 2 (two) times daily as needed for pain.  60 tablet  0  . DISCONTD: traZODone (DESYREL) 100 MG tablet Take by mouth at bedtime. 2-3 tablets at bedtime          PHYSICAL EXAM: Filed Vitals:   10/23/10 0944  BP: 134/80  Pulse: 81  Resp: 18   General:  Well appearing. No resp difficulty HEENT: normal Neck: supple. JVP flat. Carotids 2+ bilaterally; no bruits. No lymphadenopathy or thryomegaly appreciated. Cor: PMI normal. Regular rate & rhythm. No rubs, murmurs. +s4 Lungs: clear Abdomen: obese msoft, nontender, nondistended. Good bowel sounds. Extremities: no cyanosis, clubbing, rash, edema Neuro: alert & orientedx3, cranial nerves grossly intact.  Moves all 4 extremities w/o difficulty. Flat affect     ASSESSMENT & PLAN:

## 2010-10-23 NOTE — Assessment & Plan Note (Signed)
Multifactorial. I suspect there is major component of deconditioning. I had very frank talk with her about the need for exercise program on a bike or other modality or she will just continue to get worse. She does not appear very motivated to change.

## 2010-10-23 NOTE — Assessment & Plan Note (Signed)
Refuses statin 

## 2010-10-31 ENCOUNTER — Other Ambulatory Visit: Payer: Self-pay | Admitting: *Deleted

## 2010-10-31 MED ORDER — ALPRAZOLAM 0.25 MG PO TABS: ORAL_TABLET | ORAL | Status: DC

## 2010-10-31 NOTE — Telephone Encounter (Signed)
Rx called to [facility]. 

## 2010-11-02 ENCOUNTER — Telehealth: Payer: Self-pay | Admitting: *Deleted

## 2010-11-02 NOTE — Telephone Encounter (Signed)
Patient advised as instructed via telephone.  She just saw her eye doctor two weeks ago and everything was ok.

## 2010-11-02 NOTE — Telephone Encounter (Signed)
Yes increase lantus to 70 units twice daily. When did she last see her eye doctor?

## 2010-11-02 NOTE — Telephone Encounter (Signed)
Pt states her blood sugars have been running high, from 147- 282 in the morning, 273-346 in the evenings.  She has been sick with ear ache and has been on amox, which she stopped yesterday morning because it makes her feel so bad.  She has appt to see you on Monday morning.  Should she increase her lantus before then?  She is taking 68 units twice a day.  Also mentions that her vision is somewhat double.  Please advise.

## 2010-11-06 ENCOUNTER — Encounter: Payer: Self-pay | Admitting: Family Medicine

## 2010-11-06 ENCOUNTER — Ambulatory Visit (INDEPENDENT_AMBULATORY_CARE_PROVIDER_SITE_OTHER): Payer: Medicare Other | Admitting: Family Medicine

## 2010-11-06 VITALS — BP 140/70 | HR 74 | Temp 97.7°F | Ht 66.0 in | Wt 188.1 lb

## 2010-11-06 DIAGNOSIS — E119 Type 2 diabetes mellitus without complications: Secondary | ICD-10-CM

## 2010-11-06 NOTE — Progress Notes (Signed)
65 yo here to follow up DM medications and for left ear pain.  was being followed by endocrinology but she no longer wants to see a specialist.  Lab Results  Component Value Date   HGBA1C 7.8* 09/15/2010     was taking Lantus 68 units twice daily, Aphjidra 28 units twice daily but felt like it was not controlling her sugars.  has been on metformin, gylburide, glipizide all which call GI upset/shakiness. byetta also causes GI upset.  recently tried actos again (had success with it in past) but it made her too jittery.  Has had recently acute illnesses- left parotiditis, 10/03/2010, saw Dr. Para March. Last week, URI, went to UC was given amoxicillin, Zyrtec and flonase. Stopped taking everything after a few days because it "wiped her out." URI symptoms, resolved, still has a little left ear pain.   Called office last week to report CBGs in high 300s, we increased lantus to 70 units bid. Since then, CBGs, 90-145.  Denies any episodes of hypoglycemia.  The PMH, PSH, Social History, Family History, Medications, and allergies have been reviewed in [facility], and have been updated if relevant.   Review of Systems       See HPI General:  Denies fever and malaise. Eyes:  Denies blurring. ENT:  Denies difficulty swallowing. CV:  Denies CP. denies lightheadness. Resp:  Denies shortness of breath. GI:  Denies abdominal pain and change in bowel habits.  Physical Exam BP 140/70  Pulse 74  Temp(Src) 97.7 F (36.5 C) (Oral)  Ht 5\' 6"  (1.676 m)  Wt 188 lb 1.9 oz (85.331 kg)  BMI 30.36 kg/m2 General:  Well-developed,well-nourished,in no acute distress HEENT:  Tms clear bilaterally Mouth:  MMM Neck:  well healed thyroid surgical scar, no masses or tenderness Lungs:  Normal respiratory effort, chest expands symmetrically. Lungs are clear to auscultation, no crackles or wheezes.   Heart:  Normal rate and regular rhythm. S1 and S2 normal without gallop, murmur, click, rub or other extra  sounds. Extremities:  trace  left pedal edema and trace right pedal edema. mild pitting.  no calf tenderness Psych:  normally interactive, good eye contact, not anxious appearing, and dysphoric affect.

## 2010-11-06 NOTE — Patient Instructions (Signed)
Continue 70 units lantus twice daily. Come in  For follow up a1c in July.

## 2010-11-06 NOTE — Assessment & Plan Note (Signed)
Brittle diabetic. Will continue Lantus 70 units twice daily. A1c in July. The patient indicates understanding of these issues and agrees with the plan.

## 2010-12-15 ENCOUNTER — Telehealth: Payer: Self-pay | Admitting: *Deleted

## 2010-12-15 NOTE — Telephone Encounter (Signed)
Patient says that for the past couple of months her sugar has been running in the 200's to 300's. She is asking if she needs to increase her insulin. Please advise.

## 2010-12-15 NOTE — Telephone Encounter (Signed)
She is due for a follow up and a1c. Please have her scheudle an office visit and we can discuss and recheck her a1c.

## 2010-12-15 NOTE — Telephone Encounter (Signed)
Patient advised as instructed via telephone.  Follow up appt and labs scheduled for 12/26/2010.  Patient wants labs done same day as appt.

## 2010-12-25 ENCOUNTER — Other Ambulatory Visit: Payer: Self-pay | Admitting: *Deleted

## 2010-12-25 MED ORDER — ALPRAZOLAM 0.25 MG PO TABS: ORAL_TABLET | ORAL | Status: DC

## 2010-12-25 NOTE — Telephone Encounter (Signed)
Last filled for 60 on 10/31/10

## 2010-12-26 ENCOUNTER — Encounter: Payer: Self-pay | Admitting: Family Medicine

## 2010-12-26 ENCOUNTER — Other Ambulatory Visit: Payer: Medicare Other

## 2010-12-26 ENCOUNTER — Ambulatory Visit (INDEPENDENT_AMBULATORY_CARE_PROVIDER_SITE_OTHER): Payer: Medicare Other | Admitting: Family Medicine

## 2010-12-26 VITALS — BP 106/62 | HR 75 | Temp 98.4°F | Wt 186.2 lb

## 2010-12-26 DIAGNOSIS — E119 Type 2 diabetes mellitus without complications: Secondary | ICD-10-CM

## 2010-12-26 DIAGNOSIS — K219 Gastro-esophageal reflux disease without esophagitis: Secondary | ICD-10-CM

## 2010-12-26 DIAGNOSIS — E039 Hypothyroidism, unspecified: Secondary | ICD-10-CM

## 2010-12-26 LAB — BASIC METABOLIC PANEL
CO2: 29 mEq/L (ref 19–32)
Chloride: 105 mEq/L (ref 96–112)
GFR: 110.45 mL/min (ref >60.00)
Glucose, Bld: 252 mg/dL — ABNORMAL HIGH (ref 70–99)
Potassium: 4.1 mEq/L (ref 3.5–5.1)
Sodium: 139 mEq/L (ref 135–145)

## 2010-12-26 MED ORDER — ESOMEPRAZOLE MAGNESIUM 40 MG PO CPDR: 40.0000 mg | DELAYED_RELEASE_CAPSULE | Freq: Every day | ORAL | Status: DC

## 2010-12-26 MED ORDER — INSULIN GLARGINE 100 UNIT/ML ~~LOC~~ SOLN: 72.0000 [IU] | Freq: Two times a day (BID) | SUBCUTANEOUS | Status: DC

## 2010-12-26 NOTE — Progress Notes (Signed)
65 yo here to follow up DM and GERD.  was being followed by endocrinology but she no longer wants to see a specialist.  Lab Results  Component Value Date   HGBA1C 7.8* 09/15/2010     Taking Lantus 70 units twice daily. Sugars are again, high.  CBGs ranging 180-300s, one reading of 414 after dinner.  Has tried multiple medications in past, including metformin, gylburide, glipizide all which call GI upset/shakiness. byetta also causes GI upset.  recently tried actos again (had success with it in past) but it made her too jittery and she was very fearful of the warnings she has heard about actos and it's possible association with bladder cancer.  GERD- insurance company switched her from Nexium to pepcid several months ago. Since then, worsening GERD symptoms, especially in the evenings. Sometimes has morning sore throat. No CP or SOB.  Patient Active Problem List  Diagnoses  . GOITER  . UNSPECIFIED HYPOTHYROIDISM  . DIABETES MELLITUS, TYPE II  . HYPERCHOLESTEROLEMIA  . DEPRESSION, CHRONIC  . OBSTRUCTIVE SLEEP APNEA  . HYPERTENSION, BENIGN  . CORONARY ARTERY DISEASE  . PERICARDIAL EFFUSION  . ALLERGIC RHINITIS  . PULMONARY NODULE, SOLITARY  . PAROTITIS, LEFT  . GERD  . DIVERTICULOSIS, COLON  . MICROSCOPIC HEMATURIA  . OSTEOARTHRITIS, CARPOMETACARPAL JOINT, RIGHT THUMB  . FASCIITIS, PLANTAR  . MUSCLE CRAMPS, FOOT  . FATIGUE  . MEMORY LOSS  . Shortness of breath  . COUGH  . DYSURIA  . ACUTE SINUSITIS, UNSPECIFIED  . MAMMOGRAM, ABNORMAL, LEFT  . Edema   Past Medical History  Diagnosis Date  . Allergic rhinitis   . Diabetes mellitus     type 2  . Diverticulitis of colon   . GERD (gastroesophageal reflux disease)   . Chronic depression   . Hypercholesterolemia     intolerance of statins and niaspan  . Apnea, sleep     mild  . Coronary artery disease   . ACE-inhibitor cough    Past Surgical History  Procedure Date  . Cholecystectomy   . Colectomy     lap  sigmoid  . Tonsillectomy   . Bartholin gland cyst excision   . Tubal ligation   . Bladder suspension   . Breast cyst excision     3 times  . Vaginal delivery     3  . Thyroidectomy   . Abdominal hysterectomy    History  Substance Use Topics  . Smoking status: Former Smoker    Quit date: 02/03/2008  . Smokeless tobacco: Not on file  . Alcohol Use: No   Family History  Problem Relation Age of Onset  . Cancer Mother     breast  . Hypertension Father   . Cancer Father     mesothelioma   Allergies  Allergen Reactions  . Atorvastatin   . Bupropion Hcl   . Codeine Sulfate   . Gabapentin   . Lisinopril     REACTION: cough  . Meperidine Hcl   . Metformin     REACTION: unspecified  . Mirtazapine   . Mometasone Furoate     REACTION: Nause and vomiting  . Morphine Sulfate     REACTION: Chest pain  . Olanzapine   . Oxycodone-Acetaminophen   . Propoxyphene N-Acetaminophen   . Rosuvastatin     REACTION: Makes patient weak.  . Venlafaxine     REACTION: unspecified  . Zolpidem Tartrate     REACTION: Jittery, diarrhea   Current Outpatient Prescriptions on File Prior  to Visit  Medication Sig Dispense Refill  . ALPRAZolam (XANAX) 0.25 MG tablet Take one tablet by mouth three times a day as needed for anxiety  90 tablet  0  . aspirin EC 81 MG tablet Take 1 tablet (81 mg total) by mouth daily.  150 tablet  2  . Calcium-Vitamin D (CALTRATE 600 PLUS-VIT D PO) Take 1 tablet by mouth daily.        Marland Kitchen FLUoxetine (PROZAC) 40 MG capsule Take 40 mg by mouth 2 (two) times daily.        . furosemide (LASIX) 20 MG tablet Take 1 tablet (20 mg total) by mouth daily. As needed  30 tablet  11  . glucose blood test strip 1 each by Other route as needed. Use as instructed       . insulin glargine (LANTUS SOLOSTAR) 100 UNIT/ML injection Inject 68 Units into the skin 2 (two) times daily.  10 mL  3  . lansoprazole (PREVACID) 30 MG capsule Take 1 capsule (30 mg total) by mouth daily.  90 capsule  3   . levothyroxine (SYNTHROID, LEVOTHROID) 125 MCG tablet Take 125 mcg by mouth daily.        . nitroGLYCERIN (NITROLINGUAL) 0.4 MG/SPRAY spray Place 1 spray under the tongue as needed. Chest pain       . potassium chloride SA (KLOR-CON M20) 20 MEQ tablet Take 1 tablet (20 mEq total) by mouth daily. As needed  20 tablet  6  . traMADol (ULTRAM) 50 MG tablet TAKE ONE OR TWO TABLETS BY MOUTH THREE TIMES DAILY AS NEEDED FOR PAIN  90 tablet  0     The PMH, PSH, Social History, Family History, Medications, and allergies have been reviewed in [facility], and have been updated if relevant.   Review of Systems       See HPI General:  Denies fever and malaise. Eyes:  Denies blurring. ENT:  Denies difficulty swallowing. CV:  Denies CP. denies lightheadness. Resp:  Denies shortness of breath. GI:  Denies abdominal pain and change in bowel habits.  Physical Exam BP 106/62  Pulse 75  Temp(Src) 98.4 F (36.9 C) (Oral)  Wt 186 lb 4 oz (84.482 kg) General:  Well-developed,well-nourished,in no acute distress HEENT:  Tms clear bilaterally Mouth:  MMM Neck:  well healed thyroid surgical scar, no masses or tenderness Lungs:  Normal respiratory effort, chest expands symmetrically. Lungs are clear to auscultation, no crackles or wheezes.   Heart:  Normal rate and regular rhythm. S1 and S2 normal without gallop, murmur, click, rub or other extra sounds. Extremities:  trace  left pedal edema and trace right pedal edema. mild pitting.  no calf tenderness Psych:  normally interactive, good eye contact, not anxious appearing, and dysphoric affect.    Assessment and plan: 1. DIABETES MELLITUS, TYPE II   Deteriorated. Recheck a1c today.  Increase Lantus to 72 units twice daily. The patient indicates understanding of these issues and agrees with the plan.   2. GERD    Deteriorated.  Change back to Nexium, I will fill out prior authorization if necessary.

## 2010-12-26 NOTE — Patient Instructions (Signed)
Try Zantac over the counter. Please increase your lantus to 72 units twice daily.

## 2010-12-27 NOTE — Telephone Encounter (Signed)
Rx called to [facility]. 

## 2011-01-01 ENCOUNTER — Other Ambulatory Visit: Payer: Self-pay | Admitting: *Deleted

## 2011-01-01 MED ORDER — GLUCOSE BLOOD VI STRP: ORAL_STRIP | Status: DC

## 2011-01-03 ENCOUNTER — Telehealth: Payer: Self-pay | Admitting: *Deleted

## 2011-01-03 NOTE — Telephone Encounter (Signed)
Pt called with her blood sugar results- she only gave 2, the highest and the lowest- 175-376.

## 2011-01-03 NOTE — Telephone Encounter (Signed)
Please increase lantus by two units in the am and two units at night. Update dose in epic.

## 2011-01-03 NOTE — Telephone Encounter (Signed)
Patient notified. Updated med list.

## 2011-01-10 ENCOUNTER — Telehealth: Payer: Self-pay | Admitting: *Deleted

## 2011-01-10 NOTE — Telephone Encounter (Signed)
Patient notified as instructed by telephone. Pt said she will increase Lantus as instructed and call back on Monday with update; sooner if needed.

## 2011-01-10 NOTE — Telephone Encounter (Signed)
Let her know Dr Dayton Martes is out -- I looked at the chart and those actually look higher than last time.... Did she increase the lantus ?

## 2011-01-10 NOTE — Telephone Encounter (Signed)
Patient notified as instructed by telephone. Pt said over a week ago Lantus was increased to 76 units twice a day (pt takes Lantus before breakfast and at 4:30 pm before supper.) Pt said her only symptom is she feels tired.  Pt had been taking 74 units of Lantus twice a day before last increase.Please advise.

## 2011-01-10 NOTE — Telephone Encounter (Signed)
Since Dr Dayton Martes is not available and sugars are quite high I would advise her to increase both am and pm dose by 5 units  If low sugars or any problems - drop it back down  If she would rather wait for Dr Elmer Sow input - that is ok too In any case update [place] with how sugars are Monday  Thanks

## 2011-01-10 NOTE — Telephone Encounter (Signed)
Patient was told to call with sugar readings. He fasting sugars have been running around 171 and her evening sugar has been running around 467. Please advise.

## 2011-01-15 ENCOUNTER — Telehealth: Payer: Self-pay | Admitting: *Deleted

## 2011-01-15 MED ORDER — INSULIN GLARGINE 100 UNIT/ML ~~LOC~~ SOLN: 82.0000 [IU] | Freq: Two times a day (BID) | SUBCUTANEOUS | Status: DC

## 2011-01-15 NOTE — Telephone Encounter (Signed)
Let's increase to 82 units twice daily. Please adjust rx and send in rx to [facility] lorder.

## 2011-01-15 NOTE — Telephone Encounter (Signed)
Updated med list, printed rx for Dr Dayton Martes signature. Patient notified of rx. Rx left up front for pick up.

## 2011-01-15 NOTE — Telephone Encounter (Signed)
Pt reports fasting blood sugar readings of 181, 230, 192, 212, 265, 171, 186, 187.  Non fasting readings of 334, 296, 363, 341, 293, 253, 312.  She currently gets 80 units of insulin twice a day.  Needs a new written script for mailorder.  Please advise

## 2011-01-24 ENCOUNTER — Other Ambulatory Visit: Payer: Self-pay | Admitting: *Deleted

## 2011-01-24 MED ORDER — INSULIN GLARGINE 100 UNIT/ML ~~LOC~~ SOLN: 82.0000 [IU] | Freq: Two times a day (BID) | SUBCUTANEOUS | Status: DC

## 2011-01-24 NOTE — Telephone Encounter (Signed)
Received faxed from [facility] stating that patient needs refills on her insulin and also to clarify which insulin she is using.  I called patient and she stated that she uses Lantus Solostar insulin pens, 82 units twice daily.  I advised patient that I would clarify this with [facility] and send in plenty refills so she will not run out.

## 2011-01-25 ENCOUNTER — Ambulatory Visit: Payer: Medicare Other | Admitting: General Surgery

## 2011-01-29 ENCOUNTER — Other Ambulatory Visit: Payer: Self-pay | Admitting: *Deleted

## 2011-01-29 MED ORDER — INSULIN GLARGINE 100 UNIT/ML ~~LOC~~ SOLN: 82.0000 [IU] | Freq: Two times a day (BID) | SUBCUTANEOUS | Status: DC

## 2011-01-29 NOTE — Telephone Encounter (Signed)
Spoke with pharmacist at [facility] and was advised that the Lantus Solostar insulin come 15ml per box, 1,500 units per box.  Patient takes 82 units twice daily which is 164 units a day times 90 days.  That would be 10 boxes per 90 days which is 14,718ml.

## 2011-02-06 ENCOUNTER — Telehealth: Payer: Self-pay | Admitting: *Deleted

## 2011-02-06 NOTE — Telephone Encounter (Signed)
Patient called stating that she is retaining a lot of fluid and her legs and ankles are swollen with pitting edema. Patient states that the Furosemide is not helping. Patient states that the medication is prescribed once a day and some days she takes 2 or 3 and that is not helping. Patient wants to know what she should do? Pharmacy -[facility]/ [place].

## 2011-02-06 NOTE — Telephone Encounter (Signed)
Needs office visit to eval.

## 2011-02-07 NOTE — Telephone Encounter (Signed)
Completed form faxed box as instructed

## 2011-02-07 NOTE — Telephone Encounter (Signed)
Spoke with patient and she said she already has an appt scheduled with Dr. Dayton Martes.

## 2011-02-12 ENCOUNTER — Ambulatory Visit: Payer: Medicare Other | Admitting: Family Medicine

## 2011-02-12 ENCOUNTER — Ambulatory Visit (INDEPENDENT_AMBULATORY_CARE_PROVIDER_SITE_OTHER)
Admission: RE | Admit: 2011-02-12 | Discharge: 2011-02-12 | Disposition: A | Discharge status: Home / Self Care | Payer: Medicare Other | Source: Ambulatory Visit | Attending: Family Medicine | Admitting: Family Medicine

## 2011-02-12 ENCOUNTER — Encounter: Payer: Self-pay | Admitting: Family Medicine

## 2011-02-12 ENCOUNTER — Ambulatory Visit (INDEPENDENT_AMBULATORY_CARE_PROVIDER_SITE_OTHER): Payer: Medicare Other | Admitting: Family Medicine

## 2011-02-12 DIAGNOSIS — R600 Localized edema: Secondary | ICD-10-CM | POA: Insufficient documentation

## 2011-02-12 DIAGNOSIS — E119 Type 2 diabetes mellitus without complications: Secondary | ICD-10-CM

## 2011-02-12 DIAGNOSIS — R609 Edema, unspecified: Secondary | ICD-10-CM

## 2011-02-12 DIAGNOSIS — Z23 Encounter for immunization: Secondary | ICD-10-CM

## 2011-02-12 DIAGNOSIS — R079 Chest pain, unspecified: Secondary | ICD-10-CM

## 2011-02-12 LAB — BASIC METABOLIC PANEL
Calcium: 8.4 mg/dL (ref 8.4–10.5)
GFR: 83.34 mL/min (ref >60.00)
Sodium: 138 mEq/L (ref 135–145)

## 2011-02-12 LAB — LUTEINIZING HORMONE: LH: 14.19 m[IU]/mL

## 2011-02-12 LAB — HEMOGLOBIN A1C: Hgb A1c MFr Bld: 9.2 % — ABNORMAL HIGH (ref 4.6–6.5)

## 2011-02-12 LAB — T4, FREE: Free T4: 0.98 ng/dL (ref 0.60–1.60)

## 2011-02-12 LAB — FOLLICLE STIMULATING HORMONE: FSH: 34.7 m[IU]/mL

## 2011-02-12 NOTE — Progress Notes (Signed)
Addended by: Gilmer Mor on: 02/12/2011 10:58 AM   Modules accepted: Orders

## 2011-02-12 NOTE — Patient Instructions (Signed)
Good to see you. I will call you with your lab and xray results later today.

## 2011-02-12 NOTE — Progress Notes (Signed)
Subjective:    Patient ID: [name], female    DOB: 08-29-1945, 65 y.o.   MRN: [id number]  HPI 65 yo with history of hypertension, hyperlipidemia, diabetes, COPD, multi-nodular goiter s/p thyroidectomy, obesity, chronic cough and coronary artery disease, status post two-vessel angioplasty with Promus drug-eluting stents back in May 2009 here for LE edema x 4 weeks.    Since making the appointment, symptoms have resolved. Pt describes pitting edema that improved a little with elevated, resolved with as needed lasix.  Had some fleeting non exertional chest pain over the weekend. Was not bad enough that she needed her NTG.  Has cardiologist, Dr. Jesusita Oka, last saw him in May. Myoview 01/2009 EF 72% normal perfusion  Echo 02/02/2010: EF 55-60% grade I diastoilic dysfunction. no sig valve problems.   No SOB. No recent episodes of hypoglycemia.  Patient Active Problem List  Diagnoses  . GOITER  . UNSPECIFIED HYPOTHYROIDISM  . DIABETES MELLITUS, TYPE II  . HYPERCHOLESTEROLEMIA  . DEPRESSION, CHRONIC  . OBSTRUCTIVE SLEEP APNEA  . HYPERTENSION, BENIGN  . CORONARY ARTERY DISEASE  . PERICARDIAL EFFUSION  . ALLERGIC RHINITIS  . PULMONARY NODULE, SOLITARY  . PAROTITIS, LEFT  . GERD  . DIVERTICULOSIS, COLON  . MICROSCOPIC HEMATURIA  . OSTEOARTHRITIS, CARPOMETACARPAL JOINT, RIGHT THUMB  . FASCIITIS, PLANTAR  . MUSCLE CRAMPS, FOOT  . FATIGUE  . MEMORY LOSS  . Shortness of breath  . COUGH  . DYSURIA  . ACUTE SINUSITIS, UNSPECIFIED  . MAMMOGRAM, ABNORMAL, LEFT  . Edema   Past Medical History  Diagnosis Date  . Allergic rhinitis   . Diabetes mellitus     type 2  . Diverticulitis of colon   . GERD (gastroesophageal reflux disease)   . Chronic depression   . Hypercholesterolemia     intolerance of statins and niaspan  . Apnea, sleep     mild  . Coronary artery disease   . ACE-inhibitor cough    Past Surgical History  Procedure Date  . Cholecystectomy   . Colectomy     lap  sigmoid  . Tonsillectomy   . Bartholin gland cyst excision   . Tubal ligation   . Bladder suspension   . Breast cyst excision     3 times  . Vaginal delivery     3  . Thyroidectomy   . Abdominal hysterectomy    History  Substance Use Topics  . Smoking status: Former Smoker    Quit date: 02/03/2008  . Smokeless tobacco: Not on file  . Alcohol Use: No   Family History  Problem Relation Age of Onset  . Cancer Mother     breast  . Hypertension Father   . Cancer Father     mesothelioma   Allergies  Allergen Reactions  . Atorvastatin   . Bupropion Hcl   . Codeine Sulfate   . Gabapentin   . Lisinopril     REACTION: cough  . Meperidine Hcl   . Metformin     REACTION: unspecified  . Mirtazapine   . Mometasone Furoate     REACTION: Nause and vomiting  . Morphine Sulfate     REACTION: Chest pain  . Olanzapine   . Oxycodone-Acetaminophen   . Propoxyphene N-Acetaminophen   . Rosuvastatin     REACTION: Makes patient weak.  . Venlafaxine     REACTION: unspecified  . Zolpidem Tartrate     REACTION: Jittery, diarrhea   Current Outpatient Prescriptions on File Prior to Visit  Medication  Sig Dispense Refill  . ALPRAZolam (XANAX) 0.25 MG tablet Take one tablet by mouth three times a day as needed for anxiety  90 tablet  0  . aspirin EC 81 MG tablet Take 1 tablet (81 mg total) by mouth daily.  150 tablet  2  . Calcium-Vitamin D (CALTRATE 600 PLUS-VIT D PO) Take 1 tablet by mouth daily.        Marland Kitchen esomeprazole (NEXIUM) 40 MG capsule Take 1 capsule (40 mg total) by mouth daily.  30 capsule  1  . FLUoxetine (PROZAC) 40 MG capsule Take 40 mg by mouth 2 (two) times daily.        . furosemide (LASIX) 20 MG tablet Take 1 tablet (20 mg total) by mouth daily. As needed  30 tablet  11  . glucose blood test strip 1 each by Other route as needed. Use as instructed       . glucose blood test strip (OneTouch Ultra test strips) use to check blood sugar four times daily  100 each  12  .  insulin glargine (LANTUS SOLOSTAR) 100 UNIT/ML injection Inject 82 Units into the skin 2 (two) times daily.  14760 mL  3  . levothyroxine (SYNTHROID, LEVOTHROID) 125 MCG tablet Take 125 mcg by mouth daily.        . nitroGLYCERIN (NITROLINGUAL) 0.4 MG/SPRAY spray Place 1 spray under the tongue as needed. Chest pain       . potassium chloride SA (KLOR-CON M20) 20 MEQ tablet Take 1 tablet (20 mEq total) by mouth daily. As needed  20 tablet  6  . traMADol (ULTRAM) 50 MG tablet TAKE ONE OR TWO TABLETS BY MOUTH THREE TIMES DAILY AS NEEDED FOR PAIN  90 tablet  0      Review of Systems See HPI    Objective:   Physical Exam BP 132/70  Pulse 76  Temp(Src) 98.7 F (37.1 C) (Oral)  Wt 186 lb 8 oz (84.596 kg) General: Well appearing. No resp difficulty  HEENT: normal  Neck: supple. JVP flat. Carotids 2+ bilaterally; no bruits. No lymphadenopathy or thryomegaly appreciated.  Cor: PMI normal. Regular rate & rhythm. No rubs, murmurs.  Lungs: clear  Abdomen: obese msoft, nontender, nondistended. Good bowel sounds.  Extremities: no cyanosis, clubbing, rash, edema  Neuro: alert & orientedx3, cranial nerves grossly intact. Moves all 4 extremities w/o difficulty. Flat affect        Assessment & Plan:   1. Chest pain   EKG unchanged from prior, unlikely cardiac. EKG 12-Lead  2. Lower extremity edema  Resolved, likely dependent edema.  Will check labs today and chest xray If symptoms persist, will refer back to cards-?repeat 2 decho TSH, T4, free, Basic Metabolic Panel (BMET), LH, FSH, DG Chest 2 View

## 2011-02-15 ENCOUNTER — Other Ambulatory Visit: Payer: Self-pay | Admitting: *Deleted

## 2011-02-15 MED ORDER — FLUOXETINE HCL 40 MG PO CAPS: 40.0000 mg | ORAL_CAPSULE | Freq: Two times a day (BID) | ORAL | Status: DC

## 2011-02-15 NOTE — Telephone Encounter (Signed)
Phoned request from pt's husband, please send to [facility].

## 2011-02-28 LAB — PLATELET COUNT: Platelets: 202

## 2011-02-28 LAB — BASIC METABOLIC PANEL
Calcium: 8.6
GFR calc non Af Amer: >60
Glucose, Bld: 190 — ABNORMAL HIGH
Sodium: 135

## 2011-02-28 LAB — CBC
Hemoglobin: 10.9 — ABNORMAL LOW
Platelets: 179
RDW: 13.8
WBC: 11.7 — ABNORMAL HIGH

## 2011-02-28 LAB — CK TOTAL AND CKMB (NOT AT ARMC)
CK, MB: 5.6 — ABNORMAL HIGH
Relative Index: 4 — ABNORMAL HIGH

## 2011-03-01 ENCOUNTER — Other Ambulatory Visit: Payer: Self-pay | Admitting: *Deleted

## 2011-03-01 MED ORDER — TRAZODONE HCL 100 MG PO TABS: 100.0000 mg | ORAL_TABLET | Freq: Three times a day (TID) | ORAL | Status: DC

## 2011-03-01 MED ORDER — ALPRAZOLAM 0.25 MG PO TABS: ORAL_TABLET | ORAL | Status: DC

## 2011-03-01 NOTE — Telephone Encounter (Signed)
Trazadone not on patient current medication list please advise

## 2011-03-01 NOTE — Telephone Encounter (Signed)
Rx's called to [facility].

## 2011-03-02 LAB — CBC
HCT: 33.2 — ABNORMAL LOW
HCT: 34.5 — ABNORMAL LOW
Hemoglobin: 11.4 — ABNORMAL LOW
MCHC: 33
MCV: 85.9
Platelets: 156
Platelets: 163
RBC: 4.01
RDW: 13.9
RDW: 14.2
WBC: 5.4

## 2011-03-02 LAB — APTT: aPTT: 32

## 2011-03-02 LAB — BASIC METABOLIC PANEL
BUN: 11
BUN: 13
CO2: 24
Calcium: 8.7
Calcium: 9
Chloride: 106
Creatinine, Ser: 0.58
GFR calc Af Amer: >60
GFR calc non Af Amer: >60
GFR calc non Af Amer: >60
Glucose, Bld: 101 — ABNORMAL HIGH
Glucose, Bld: 171 — ABNORMAL HIGH
Potassium: 3.7
Sodium: 139
Sodium: 140

## 2011-03-02 LAB — PROTIME-INR
INR: 1
Prothrombin Time: 13.8

## 2011-04-23 ENCOUNTER — Other Ambulatory Visit: Payer: Self-pay | Admitting: *Deleted

## 2011-04-23 MED ORDER — TRAZODONE HCL 100 MG PO TABS: ORAL_TABLET | ORAL | Status: DC

## 2011-04-23 NOTE — Telephone Encounter (Signed)
Yes ok to refill with 3 refills 

## 2011-04-23 NOTE — Telephone Encounter (Signed)
Patient advised via telephone that Rx was sent to [facility]/[place] .

## 2011-04-23 NOTE — Telephone Encounter (Signed)
Rx sent to [facility] in [place], called patient several times on home number and lines continues to be busy.  Will call back later.

## 2011-04-23 NOTE — Telephone Encounter (Signed)
Pt is asking for a refill on trazodone 100 mg's she takes 3 at bedtime for sleep.  This is no longer on med list, but pt says she has been taking if for years.  Says [facility] in [place] faxed a request a week ago and we have not responded.

## 2011-04-27 ENCOUNTER — Other Ambulatory Visit: Payer: Self-pay | Admitting: *Deleted

## 2011-04-27 MED ORDER — INSULIN PEN NEEDLE 32G X 6 MM MISC: Status: DC

## 2011-05-24 ENCOUNTER — Inpatient Hospital Stay: Payer: Medicare Other | Admitting: Internal Medicine

## 2011-05-24 ENCOUNTER — Telehealth: Payer: Self-pay | Admitting: Internal Medicine

## 2011-05-24 DIAGNOSIS — I359 Nonrheumatic aortic valve disorder, unspecified: Secondary | ICD-10-CM

## 2011-05-24 NOTE — Telephone Encounter (Signed)
If she is very short of breath, needs to be seen today. If no one can see her, can offer urgent care. We can send in inhaler if she feels she can wait until tomorrow.

## 2011-05-24 NOTE — Telephone Encounter (Signed)
Patient advised as instructed via telephone.  She stated that she has used her inhaler and it's not helping, she sounds SOB and is wheezing.  I advised patient that she needs to be seen now and should not wait until tomorrow for an appt.  Patient stated that she will go to Urgent Care now.  She will keep [place] posted.

## 2011-05-24 NOTE — Telephone Encounter (Signed)
Patient called and stated she is sick with coughing, wheezing,SOB, headache and sore throat.  I made an appointment for tomorrow but she would like to know what she can do until she gets to her appointment tomorrow.

## 2011-05-25 ENCOUNTER — Ambulatory Visit: Payer: Medicare Other | Admitting: Family Medicine

## 2011-06-04 ENCOUNTER — Telehealth: Payer: Self-pay | Admitting: Internal Medicine

## 2011-06-04 DIAGNOSIS — E119 Type 2 diabetes mellitus without complications: Secondary | ICD-10-CM

## 2011-06-04 DIAGNOSIS — E039 Hypothyroidism, unspecified: Secondary | ICD-10-CM

## 2011-06-04 NOTE — Telephone Encounter (Signed)
Patient called and stated she needs to go and see Dr. Jonette Pesa for her thyroid and diabetes but she needs a referral.

## 2011-06-04 NOTE — Telephone Encounter (Signed)
She has been seeing him so she should not need a referral.  I will place referral just in case.

## 2011-06-05 DIAGNOSIS — Z9289 Personal history of other medical treatment: Secondary | ICD-10-CM

## 2011-06-05 HISTORY — DX: Personal history of other medical treatment: Z92.89

## 2011-06-07 ENCOUNTER — Encounter: Payer: Self-pay | Admitting: Family Medicine

## 2011-06-07 ENCOUNTER — Ambulatory Visit (INDEPENDENT_AMBULATORY_CARE_PROVIDER_SITE_OTHER): Payer: Medicare Other | Admitting: Family Medicine

## 2011-06-07 VITALS — BP 130/70 | HR 69 | Temp 98.0°F | Wt 185.5 lb

## 2011-06-07 DIAGNOSIS — E039 Hypothyroidism, unspecified: Secondary | ICD-10-CM | POA: Diagnosis not present

## 2011-06-07 DIAGNOSIS — E119 Type 2 diabetes mellitus without complications: Secondary | ICD-10-CM

## 2011-06-07 DIAGNOSIS — J9600 Acute respiratory failure, unspecified whether with hypoxia or hypercapnia: Secondary | ICD-10-CM

## 2011-06-07 DIAGNOSIS — D649 Anemia, unspecified: Secondary | ICD-10-CM | POA: Diagnosis not present

## 2011-06-07 LAB — IBC PANEL
Iron: 23 ug/dL — ABNORMAL LOW (ref 42–145)
Transferrin: 228.1 mg/dL (ref 212.0–360.0)

## 2011-06-07 LAB — CBC WITH DIFFERENTIAL/PLATELET
Basophils Absolute: 0.1 10*3/uL (ref 0.0–0.1)
Eosinophils Relative: 1.5 % (ref 0.0–5.0)
MCV: 73.3 fl — ABNORMAL LOW (ref 78.0–100.0)
Monocytes Absolute: 0.7 10*3/uL (ref 0.1–1.0)
Neutrophils Relative %: 70.8 % (ref 43.0–77.0)
Platelets: 220 10*3/uL (ref 150.0–400.0)
WBC: 9.7 10*3/uL (ref 4.5–10.5)

## 2011-06-07 LAB — T4, FREE: Free T4: 0.93 ng/dL (ref 0.60–1.60)

## 2011-06-07 NOTE — Progress Notes (Signed)
Subjective:    Patient ID: [name], female    DOB: 04/16/1946, 66 y.o.   MRN: [id number]  HPI 66 yo with history of hypertension, hyperlipidemia, diabetes, COPD, multi-nodular goiter s/p thyroidectomy, obesity, chronic cough and coronary artery disease, status post two-vessel angioplasty with Promus drug-eluting stents back in May 2009 here for hospital follow up.  Notes reviewed.  Admitted to [facility] 12/20- 05/28/2011.  1.  Acute respiratory failure due to bronchitis and COPD exacerbation. Initial CXR- poor study, ? Pulmonary interstitial prominence. 2 D echo- Normal EF with atrial dilation EKG- NSR Placed on Levaquin, prednisone and short course of diuresis with IV lasix. Symptoms resolved.  2.  Anemia- found to have microcytic anemia on admission, Hbg 8.5.  Due to CAD, transfused 1 Unit PRBCs. B12 646, Ferritin 29, Iron 20. Stool guaic neg. Last colonoscopy in 2007. Has not noticed any blood in stool.  3.  DM- a1c 8.4. Switched to short acting insulin premeal(humulog 10 units three times daily ) with 82 units of lantus in the evening. Says sugars have improved but cannot remember range. Denies any episodes of hypoglycemia.  4.  Hypothyroidism- TSH elevated to 14.2 and synthroid was increased to 150 micrograms daily.     Wants me to increase dose again because she feels "lousy."  Patient Active Problem List  Diagnoses  . GOITER  . UNSPECIFIED HYPOTHYROIDISM  . DIABETES MELLITUS, TYPE II  . HYPERCHOLESTEROLEMIA  . DEPRESSION, CHRONIC  . OBSTRUCTIVE SLEEP APNEA  . HYPERTENSION, BENIGN  . CORONARY ARTERY DISEASE  . PERICARDIAL EFFUSION  . ALLERGIC RHINITIS  . PULMONARY NODULE, SOLITARY  . PAROTITIS, LEFT  . GERD  . DIVERTICULOSIS, COLON  . MICROSCOPIC HEMATURIA  . OSTEOARTHRITIS, CARPOMETACARPAL JOINT, RIGHT THUMB  . FASCIITIS, PLANTAR  . MUSCLE CRAMPS, FOOT  . FATIGUE  . MEMORY LOSS  . Shortness of breath  . COUGH  . DYSURIA  . ACUTE SINUSITIS, UNSPECIFIED    . MAMMOGRAM, ABNORMAL, LEFT  . Edema  . Lower extremity edema  . Anemia  . Acute respiratory failure   Past Medical History  Diagnosis Date  . Allergic rhinitis   . Diabetes mellitus     type 2  . Diverticulitis of colon   . GERD (gastroesophageal reflux disease)   . Chronic depression   . Hypercholesterolemia     intolerance of statins and niaspan  . Apnea, sleep     mild  . Coronary artery disease   . ACE-inhibitor cough    Past Surgical History  Procedure Date  . Cholecystectomy   . Colectomy     lap sigmoid  . Tonsillectomy   . Bartholin gland cyst excision   . Tubal ligation   . Bladder suspension   . Breast cyst excision     3 times  . Vaginal delivery     3  . Thyroidectomy   . Abdominal hysterectomy    History  Substance Use Topics  . Smoking status: Former Smoker    Quit date: 02/03/2008  . Smokeless tobacco: Not on file  . Alcohol Use: No   Family History  Problem Relation Age of Onset  . Cancer Mother     breast  . Hypertension Father   . Cancer Father     mesothelioma   Allergies  Allergen Reactions  . Atorvastatin   . Bupropion Hcl   . Codeine Sulfate   . Gabapentin   . Lisinopril     REACTION: cough  . Meperidine Hcl   .  Metformin     REACTION: unspecified  . Mirtazapine   . Mometasone Furoate     REACTION: Nause and vomiting  . Morphine Sulfate     REACTION: Chest pain  . Olanzapine   . Oxycodone-Acetaminophen   . Propoxyphene N-Acetaminophen   . Rosuvastatin     REACTION: Makes patient weak.  . Venlafaxine     REACTION: unspecified  . Zolpidem Tartrate     REACTION: Jittery, diarrhea   Current Outpatient Prescriptions on File Prior to Visit  Medication Sig Dispense Refill  . ALPRAZolam (XANAX) 0.25 MG tablet Take one tablet by mouth three times a day as needed for anxiety  90 tablet  0  . aspirin EC 81 MG tablet Take 1 tablet (81 mg total) by mouth daily.  150 tablet  2  . Calcium-Vitamin D (CALTRATE 600 PLUS-VIT D  PO) Take 1 tablet by mouth daily.        Marland Kitchen esomeprazole (NEXIUM) 40 MG capsule Take 1 capsule (40 mg total) by mouth daily.  30 capsule  1  . FLUoxetine (PROZAC) 40 MG capsule Take 1 capsule (40 mg total) by mouth 2 (two) times daily.  180 capsule  3  . furosemide (LASIX) 20 MG tablet Take 1 tablet (20 mg total) by mouth daily. As needed  30 tablet  11  . glucose blood test strip 1 each by Other route as needed. Use as instructed       . glucose blood test strip (OneTouch Ultra test strips) use to check blood sugar four times daily  100 each  12  . insulin glargine (LANTUS SOLOSTAR) 100 UNIT/ML injection Inject 82 Units into the skin 2 (two) times daily.  14760 mL  3  . Insulin Pen Needle 32G X 6 MM MISC To use twice daily with byetta.  100 each  3  . levothyroxine (SYNTHROID, LEVOTHROID) 125 MCG tablet Take 125 mcg by mouth daily.        . nitroGLYCERIN (NITROLINGUAL) 0.4 MG/SPRAY spray Place 1 spray under the tongue as needed. Chest pain       . potassium chloride SA (KLOR-CON M20) 20 MEQ tablet Take 1 tablet (20 mEq total) by mouth daily. As needed  20 tablet  6  . traMADol (ULTRAM) 50 MG tablet TAKE ONE OR TWO TABLETS BY MOUTH THREE TIMES DAILY AS NEEDED FOR PAIN  90 tablet  0  . traZODone (DESYREL) 100 MG tablet Take three tablets by mouth at bedtime  90 tablet  3      Review of Systems See HPI    Objective:   Physical Exam BP 130/70  Pulse 69  Temp(Src) 98 F (36.7 C) (Oral)  Wt 185 lb 8 oz (84.142 kg)  General: Well appearing. No resp difficulty  HEENT: normal  Neck: supple. JVP flat. Carotids 2+ bilaterally; no bruits. No lymphadenopathy or thryomegaly appreciated.  Cor: PMI normal. Regular rate & rhythm. No rubs, murmurs.  Lungs: clear  Abdomen: obese msoft, nontender, nondistended. Good bowel sounds.  Extremities: no cyanosis, clubbing, rash, edema  Neuro: alert & orientedx3, cranial nerves grossly intact. Moves all 4 extremities w/o difficulty. Flat affect       Assessment & Plan:   1. Anemia   New- strongly advised repeat colonscopy and heme onc referral.  Pt decline. Recheck CBC and TIBC today. CBC w/Diff, Iron Binding Cap (TIBC)  2. Acute respiratory failure  Resolved, ? Infectious.    3. DIABETES MELLITUS, TYPE II  Deteriorated.  Has  appt with Dr. Tedd Sias next week.    4. Unspecified hypothyroidism  Deteriorated.  Will not increase synthroid today. Recheck labs first.  TSH, T4, free

## 2011-06-07 NOTE — Patient Instructions (Signed)
Good to see you, [name]. We will call you with your lab results most likely tomorrow.

## 2011-06-08 ENCOUNTER — Other Ambulatory Visit: Payer: Self-pay | Admitting: Family Medicine

## 2011-06-08 ENCOUNTER — Ambulatory Visit: Payer: Self-pay | Admitting: Internal Medicine

## 2011-06-08 DIAGNOSIS — D509 Iron deficiency anemia, unspecified: Secondary | ICD-10-CM

## 2011-06-15 ENCOUNTER — Ambulatory Visit: Payer: Self-pay | Admitting: Internal Medicine

## 2011-06-15 DIAGNOSIS — R5381 Other malaise: Secondary | ICD-10-CM | POA: Diagnosis not present

## 2011-06-15 DIAGNOSIS — K219 Gastro-esophageal reflux disease without esophagitis: Secondary | ICD-10-CM | POA: Diagnosis not present

## 2011-06-15 DIAGNOSIS — E785 Hyperlipidemia, unspecified: Secondary | ICD-10-CM | POA: Diagnosis not present

## 2011-06-15 DIAGNOSIS — E119 Type 2 diabetes mellitus without complications: Secondary | ICD-10-CM | POA: Diagnosis not present

## 2011-06-15 DIAGNOSIS — I2510 Atherosclerotic heart disease of native coronary artery without angina pectoris: Secondary | ICD-10-CM | POA: Diagnosis not present

## 2011-06-15 DIAGNOSIS — R5383 Other fatigue: Secondary | ICD-10-CM | POA: Diagnosis not present

## 2011-06-15 DIAGNOSIS — Z79899 Other long term (current) drug therapy: Secondary | ICD-10-CM | POA: Diagnosis not present

## 2011-06-15 DIAGNOSIS — Z794 Long term (current) use of insulin: Secondary | ICD-10-CM | POA: Diagnosis not present

## 2011-06-15 DIAGNOSIS — I10 Essential (primary) hypertension: Secondary | ICD-10-CM | POA: Diagnosis not present

## 2011-06-15 DIAGNOSIS — D509 Iron deficiency anemia, unspecified: Secondary | ICD-10-CM | POA: Diagnosis not present

## 2011-06-15 DIAGNOSIS — F329 Major depressive disorder, single episode, unspecified: Secondary | ICD-10-CM | POA: Diagnosis not present

## 2011-06-15 DIAGNOSIS — E039 Hypothyroidism, unspecified: Secondary | ICD-10-CM | POA: Diagnosis not present

## 2011-06-15 LAB — CBC CANCER CENTER
Basophil #: 0 x10 3/mm (ref 0.0–0.1)
Basophil %: 0.3 %
Basophil: 1 %
Eosinophil #: 0.2 x10 3/mm (ref 0.0–0.7)
HCT: 33.2 % — ABNORMAL LOW (ref 35.0–47.0)
HGB: 10.9 g/dL — ABNORMAL LOW (ref 12.0–16.0)
Lymphocyte #: 2 x10 3/mm (ref 1.0–3.6)
Lymphocyte %: 22.3 %
MCHC: 32.8 g/dL (ref 32.0–36.0)
MCV: 72 fL — ABNORMAL LOW (ref 80–100)
Monocyte %: 6 %
Monocytes: 4 %
Neutrophil #: 6.2 x10 3/mm (ref 1.4–6.5)
Neutrophil %: 69.3 %
RDW: 20.3 % — ABNORMAL HIGH (ref 11.5–14.5)

## 2011-06-15 LAB — RETICULOCYTES: Absolute Retic Count: 0.106 10*6/uL — ABNORMAL HIGH (ref 0.024–0.084)

## 2011-06-18 LAB — PROT IMMUNOELECTROPHORES(ARMC)

## 2011-06-20 DIAGNOSIS — M359 Systemic involvement of connective tissue, unspecified: Secondary | ICD-10-CM | POA: Diagnosis not present

## 2011-06-20 DIAGNOSIS — M159 Polyosteoarthritis, unspecified: Secondary | ICD-10-CM | POA: Diagnosis not present

## 2011-06-20 DIAGNOSIS — D649 Anemia, unspecified: Secondary | ICD-10-CM | POA: Diagnosis not present

## 2011-06-22 DIAGNOSIS — E039 Hypothyroidism, unspecified: Secondary | ICD-10-CM | POA: Diagnosis not present

## 2011-06-22 DIAGNOSIS — Z794 Long term (current) use of insulin: Secondary | ICD-10-CM | POA: Diagnosis not present

## 2011-07-06 ENCOUNTER — Ambulatory Visit: Payer: Self-pay | Admitting: Internal Medicine

## 2011-07-10 ENCOUNTER — Ambulatory Visit: Payer: Self-pay | Admitting: General Surgery

## 2011-07-10 DIAGNOSIS — R920 Mammographic microcalcification found on diagnostic imaging of breast: Secondary | ICD-10-CM | POA: Diagnosis not present

## 2011-07-10 DIAGNOSIS — N6459 Other signs and symptoms in breast: Secondary | ICD-10-CM | POA: Diagnosis not present

## 2011-07-12 DIAGNOSIS — J301 Allergic rhinitis due to pollen: Secondary | ICD-10-CM | POA: Diagnosis not present

## 2011-07-12 DIAGNOSIS — E039 Hypothyroidism, unspecified: Secondary | ICD-10-CM | POA: Diagnosis not present

## 2011-07-13 DIAGNOSIS — Z794 Long term (current) use of insulin: Secondary | ICD-10-CM | POA: Diagnosis not present

## 2011-07-13 DIAGNOSIS — E039 Hypothyroidism, unspecified: Secondary | ICD-10-CM | POA: Diagnosis not present

## 2011-07-13 DIAGNOSIS — E669 Obesity, unspecified: Secondary | ICD-10-CM | POA: Diagnosis not present

## 2011-07-16 DIAGNOSIS — J301 Allergic rhinitis due to pollen: Secondary | ICD-10-CM | POA: Diagnosis not present

## 2011-07-17 DIAGNOSIS — N6019 Diffuse cystic mastopathy of unspecified breast: Secondary | ICD-10-CM | POA: Diagnosis not present

## 2011-07-17 DIAGNOSIS — Z803 Family history of malignant neoplasm of breast: Secondary | ICD-10-CM | POA: Diagnosis not present

## 2011-07-19 ENCOUNTER — Encounter: Payer: Self-pay | Admitting: Family Medicine

## 2011-07-19 ENCOUNTER — Ambulatory Visit (INDEPENDENT_AMBULATORY_CARE_PROVIDER_SITE_OTHER): Payer: Medicare Other | Admitting: Family Medicine

## 2011-07-19 ENCOUNTER — Other Ambulatory Visit: Payer: Self-pay | Admitting: General Surgery

## 2011-07-19 VITALS — BP 118/70 | HR 71 | Temp 98.0°F | Ht 66.0 in | Wt 185.0 lb

## 2011-07-19 DIAGNOSIS — Z803 Family history of malignant neoplasm of breast: Secondary | ICD-10-CM

## 2011-07-19 DIAGNOSIS — J209 Acute bronchitis, unspecified: Secondary | ICD-10-CM

## 2011-07-19 MED ORDER — IPRATROPIUM-ALBUTEROL 0.5-2.5 (3) MG/3ML IN SOLN: 3.0000 mL | RESPIRATORY_TRACT | Status: DC | PRN

## 2011-07-19 MED ORDER — LEVOFLOXACIN 500 MG PO TABS: 500.0000 mg | ORAL_TABLET | Freq: Every day | ORAL | Status: AC

## 2011-07-19 NOTE — Progress Notes (Signed)
  Patient Name: [name] Date of Birth: 11-Dec-1945 Age: 66 y.o. Medical Record Number: [id number] Gender: female Date of Encounter: 07/19/2011  History of Present Illness:  [name] is a 66 y.o. very pleasant female patient who presents with the following:  05/2011 --- hospitalized. Nauseated, sweating and weak with a little bit of tightness in her chest. Generally feels lousy. No fever. Ears  Some sinus pain.  Sick for about 15-47 days  66 year old female who appears older than her stated age, admitted 2 months ago with respiratory failure to the hospital. She presents now with some chills, sweating, productive cough up sputum. Nose significant fever. She is also having some sinus pain and congestion. No significant ear pain. No nausea, vomiting, or diarrhea. She is able to tolerate p.o. Intake, but this is slightly decreased. Overall she does not feel well.  Past Medical History, Surgical History, Social History, Family History, Problem List, Medications, and Allergies have been reviewed and updated if relevant.  Review of Systems: ROS: GEN: Acute illness details above GI: Tolerating PO intake GU: maintaining adequate hydration and urination Pulm: No SOB Interactive and getting along well at home.  Otherwise, ROS is as per the HPI.   Physical Examination: Filed Vitals:   07/19/11 1529  BP: 118/70  Pulse: 71  Temp: 98 F (36.7 C)  TempSrc: Oral  Height: 5\' 6"  (1.676 m)  Weight: 185 lb (83.915 kg)  SpO2: 98%    Body mass index is 29.86 kg/(m^2).   GEN: A and O x 3. WDWN. NAD.    ENT: Nose clear, ext NML.  No LAD.  No JVD.  TM's clear. Oropharynx clear.  PULM: Normal WOB, no distress. No crackles, wheezes, rhonchi. CV: RRR, no M/G/R, No rubs, No JVD.   EXT: warm and well-perfused, No c/c/e. PSYCH: Pleasant and conversant.   Assessment and Plan: 1. Bronchitis, acute, with bronchospasm  levofloxacin (LEVAQUIN) 500 MG tablet, ipratropium-albuterol (DUONEB)  0.5-2.5 (3) MG/3ML SOLN    She reports having some history of wheezing at nighttime, she is not wheezing currently. Her chest is minimally tight. She is going to use her DuoNeb as needed up to every 4 hours.

## 2011-07-20 ENCOUNTER — Telehealth: Payer: Self-pay | Admitting: Internal Medicine

## 2011-07-20 NOTE — Telephone Encounter (Signed)
New Msg: Pt calling wanting to schedule appt to see Dr. Gala Romney. Please return pt call to inform if MD wants to see pt in [facility] Office, CHF Clinic, or if pt care needs to be transferred to another MD. Please return pt call to discuss further.

## 2011-07-20 NOTE — Telephone Encounter (Signed)
Please transition to another cardiologist. Thanks.

## 2011-07-20 NOTE — Telephone Encounter (Signed)
The pt does not have a diagnosis of CHF. I made the pt aware that I will send this message to Dr Gala Romney to see if he plans on continuing to follow the patient or if he would like to transition her care to another Warden/ranger. I will forward this message to Dr Gala Romney and Eunice Blase RN for a response.

## 2011-07-23 NOTE — Telephone Encounter (Signed)
Pt is requesting an appt with Dr Mariah Milling for further cardiology care.

## 2011-07-27 ENCOUNTER — Ambulatory Visit
Admission: RE | Admit: 2011-07-27 | Discharge: 2011-07-27 | Disposition: A | Discharge status: Home / Self Care | Payer: Medicare Other | Source: Ambulatory Visit | Attending: General Surgery | Admitting: General Surgery

## 2011-07-27 DIAGNOSIS — Z803 Family history of malignant neoplasm of breast: Secondary | ICD-10-CM

## 2011-07-27 MED ORDER — GADOBENATE DIMEGLUMINE 529 MG/ML IV SOLN
18.0000 mL | Freq: Once | INTRAVENOUS | Status: AC | PRN
  Administered 2011-07-27: 18 mL via INTRAVENOUS

## 2011-07-31 DIAGNOSIS — D509 Iron deficiency anemia, unspecified: Secondary | ICD-10-CM | POA: Diagnosis not present

## 2011-08-17 ENCOUNTER — Other Ambulatory Visit: Payer: Self-pay | Admitting: Family Medicine

## 2011-08-21 ENCOUNTER — Ambulatory Visit: Payer: Self-pay | Admitting: Internal Medicine

## 2011-08-21 DIAGNOSIS — Z794 Long term (current) use of insulin: Secondary | ICD-10-CM | POA: Diagnosis not present

## 2011-08-21 DIAGNOSIS — D509 Iron deficiency anemia, unspecified: Secondary | ICD-10-CM | POA: Diagnosis not present

## 2011-08-21 DIAGNOSIS — I10 Essential (primary) hypertension: Secondary | ICD-10-CM | POA: Diagnosis not present

## 2011-08-21 DIAGNOSIS — Z79899 Other long term (current) drug therapy: Secondary | ICD-10-CM | POA: Diagnosis not present

## 2011-08-21 DIAGNOSIS — E785 Hyperlipidemia, unspecified: Secondary | ICD-10-CM | POA: Diagnosis not present

## 2011-08-21 DIAGNOSIS — K219 Gastro-esophageal reflux disease without esophagitis: Secondary | ICD-10-CM | POA: Diagnosis not present

## 2011-08-21 DIAGNOSIS — E039 Hypothyroidism, unspecified: Secondary | ICD-10-CM | POA: Diagnosis not present

## 2011-08-21 DIAGNOSIS — I2510 Atherosclerotic heart disease of native coronary artery without angina pectoris: Secondary | ICD-10-CM | POA: Diagnosis not present

## 2011-08-21 DIAGNOSIS — F329 Major depressive disorder, single episode, unspecified: Secondary | ICD-10-CM | POA: Diagnosis not present

## 2011-08-21 DIAGNOSIS — E119 Type 2 diabetes mellitus without complications: Secondary | ICD-10-CM | POA: Diagnosis not present

## 2011-08-21 LAB — CBC CANCER CENTER
Basophil #: 0 x10 3/mm (ref 0.0–0.1)
Eosinophil %: 2.1 %
HCT: 33.4 % — ABNORMAL LOW (ref 35.0–47.0)
HGB: 11.4 g/dL — ABNORMAL LOW (ref 12.0–16.0)
Lymphocyte #: 1.7 x10 3/mm (ref 1.0–3.6)
MCH: 27.2 pg (ref 26.0–34.0)
MCHC: 34 g/dL (ref 32.0–36.0)
MCV: 80 fL (ref 80–100)
Neutrophil #: 5.1 x10 3/mm (ref 1.4–6.5)
Neutrophil %: 67.3 %
RBC: 4.17 10*6/uL (ref 3.80–5.20)
RDW: 18.6 % — ABNORMAL HIGH (ref 11.5–14.5)

## 2011-08-21 LAB — IRON AND TIBC
Iron Bind.Cap.(Total): 283 ug/dL (ref 250–450)
Iron: 45 ug/dL — ABNORMAL LOW (ref 50–170)
Unbound Iron-Bind.Cap.: 238 ug/dL

## 2011-08-22 ENCOUNTER — Telehealth: Payer: Self-pay | Admitting: Cardiovascular Disease

## 2011-08-22 DIAGNOSIS — Z79899 Other long term (current) drug therapy: Secondary | ICD-10-CM | POA: Diagnosis not present

## 2011-08-22 DIAGNOSIS — D509 Iron deficiency anemia, unspecified: Secondary | ICD-10-CM | POA: Diagnosis not present

## 2011-08-22 NOTE — Telephone Encounter (Signed)
Patient called, stated she is sob and having increase swelling in lower legs and abdomen for the past 2 to 3 weeks.Stated worse in the last 3 to 4 days.Stated she normally takes furosemide 20 mg daily if needed,but she has increased.States she took 4 tablets yesterday 08/21/11 80 mg with no relief.States she has appointment with Dr.Gollan 09/12/11 but cannot wait.States she has seen Dr.Besimhon but was told he only sees critical patients.Fowarded to [employer] for advice.

## 2011-08-22 NOTE — Telephone Encounter (Signed)
[name] states she prefers an appt with Dr Mariah Milling before the echo if that is ok.

## 2011-08-22 NOTE — Telephone Encounter (Signed)
Thanks Would try to squeeze her into schedule if there is a slot somewhere Does she want echo before appt?

## 2011-08-22 NOTE — Telephone Encounter (Signed)
Pt callings stating that she has had increased swelling. Her pcp called and moved her appt up but she called and wants to know if Dr Mariah Milling can give her some medication for this.

## 2011-08-22 NOTE — Telephone Encounter (Signed)
Pt was notified.  She states she just had a cbc and iron studies done yesterday after receiving a series of iron infusions.  She will have them fax [place] the results.  She will have her other labs done at the [place] clinic.  Lab order was faxed to them with a note to fax the results to Dr Mariah Milling.

## 2011-08-22 NOTE — Telephone Encounter (Signed)
I would start with blood work. Could be anemia (HCVT was 31 in 06/2011) Anemia can cause swelling, diuretic may not help. Other things we can check as well. Would check CBC, BNP, BMP to start  We can try to get her into clinic here earlier once labs done.  Might also need echo in [place] to evaluate cardiac function and pressures

## 2011-08-23 ENCOUNTER — Other Ambulatory Visit: Payer: Medicare Other

## 2011-08-23 ENCOUNTER — Emergency Department: Payer: Self-pay | Admitting: Emergency Medicine

## 2011-08-23 ENCOUNTER — Other Ambulatory Visit: Payer: Self-pay

## 2011-08-23 ENCOUNTER — Telehealth: Payer: Self-pay | Admitting: Cardiovascular Disease

## 2011-08-23 DIAGNOSIS — Z9089 Acquired absence of other organs: Secondary | ICD-10-CM | POA: Diagnosis not present

## 2011-08-23 DIAGNOSIS — I519 Heart disease, unspecified: Secondary | ICD-10-CM | POA: Diagnosis not present

## 2011-08-23 DIAGNOSIS — R079 Chest pain, unspecified: Secondary | ICD-10-CM | POA: Diagnosis not present

## 2011-08-23 DIAGNOSIS — Z95818 Presence of other cardiac implants and grafts: Secondary | ICD-10-CM | POA: Diagnosis not present

## 2011-08-23 DIAGNOSIS — Z7901 Long term (current) use of anticoagulants: Secondary | ICD-10-CM | POA: Diagnosis not present

## 2011-08-23 DIAGNOSIS — E119 Type 2 diabetes mellitus without complications: Secondary | ICD-10-CM | POA: Diagnosis not present

## 2011-08-23 DIAGNOSIS — R0789 Other chest pain: Secondary | ICD-10-CM | POA: Diagnosis not present

## 2011-08-23 DIAGNOSIS — Z79899 Other long term (current) drug therapy: Secondary | ICD-10-CM

## 2011-08-23 DIAGNOSIS — Z794 Long term (current) use of insulin: Secondary | ICD-10-CM | POA: Diagnosis not present

## 2011-08-23 DIAGNOSIS — R0602 Shortness of breath: Secondary | ICD-10-CM

## 2011-08-23 LAB — COMPREHENSIVE METABOLIC PANEL
Alkaline Phosphatase: 84 U/L (ref 50–136)
Anion Gap: 12 (ref 7–16)
BUN: 9 mg/dL (ref 7–18)
Bilirubin,Total: 0.2 mg/dL (ref 0.2–1.0)
Calcium, Total: 8.6 mg/dL (ref 8.5–10.1)
Chloride: 107 mmol/L (ref 98–107)
Co2: 25 mmol/L (ref 21–32)
EGFR (African American): >60
EGFR (Non-African Amer.): >60
Osmolality: 286 (ref 275–301)
Potassium: 3.6 mmol/L (ref 3.5–5.1)
Sodium: 144 mmol/L (ref 136–145)
Total Protein: 7 g/dL (ref 6.4–8.2)

## 2011-08-23 LAB — CK TOTAL AND CKMB (NOT AT ARMC)
CK, Total: 133 U/L (ref 21–215)
CK-MB: 1.4 ng/mL (ref 0.5–3.6)

## 2011-08-23 LAB — CBC
HCT: 34 % — ABNORMAL LOW (ref 35.0–47.0)
MCH: 26.8 pg (ref 26.0–34.0)
MCHC: 33.3 g/dL (ref 32.0–36.0)
MCV: 80 fL (ref 80–100)
Platelet: 159 10*3/uL (ref 150–440)
RDW: 17.8 % — ABNORMAL HIGH (ref 11.5–14.5)

## 2011-08-23 LAB — PRO B NATRIURETIC PEPTIDE: B-Type Natriuretic Peptide: 120 pg/mL (ref 0–125)

## 2011-08-23 LAB — TROPONIN I: Troponin-I: <0.02 ng/mL

## 2011-08-23 NOTE — Telephone Encounter (Signed)
The patient did go to [facility] for evaluation.

## 2011-08-23 NOTE — Telephone Encounter (Signed)
The patient came in for labs today but was instructed needs to go to the ER at [facility].  She was very short of breath and had extreme swelling in legs, feet and ankles. She could not get a deep breath and struggling to walk.

## 2011-08-23 NOTE — Telephone Encounter (Signed)
Pt husband came in stating that they were to schedule an appt and have some labs. There was no mention in the office phone note about labs, after looking in my staff messages I saw that the nurse had faxed an order to [facility] for the labs. I called pt and LM for them to call so that we can get the labs drawn.

## 2011-08-23 NOTE — Telephone Encounter (Signed)
Message copied by Thersa Salt on Thu Aug 23, 2011  8:09 AM ------      Message from: Worthy Rancher D      Created: Wed Aug 22, 2011  4:12 PM      Regarding: RE: LABS       Thank you for letting me know.  I faxed a lab request to [facility] clinic for the bmet and bnp.      ----- Message -----         From: Thersa Salt         Sent: 08/22/2011   3:56 PM           To: Zola Button, LPN      Subject: LABS                                                     I just sent labs to Dr Mariah Milling for this pt

## 2011-08-23 NOTE — Telephone Encounter (Signed)
Will forward to [employer] in [place] for scheduling.

## 2011-08-24 ENCOUNTER — Telehealth: Payer: Self-pay

## 2011-08-24 ENCOUNTER — Telehealth: Payer: Self-pay | Admitting: *Deleted

## 2011-08-24 ENCOUNTER — Institutional Professional Consult (permissible substitution): Payer: Medicare Other | Admitting: Cardiovascular Disease

## 2011-08-24 ENCOUNTER — Encounter: Payer: Self-pay | Admitting: *Deleted

## 2011-08-24 ENCOUNTER — Ambulatory Visit (INDEPENDENT_AMBULATORY_CARE_PROVIDER_SITE_OTHER): Payer: Medicare Other | Admitting: Cardiovascular Disease

## 2011-08-24 VITALS — BP 152/73 | HR 73 | Ht 66.0 in | Wt 188.0 lb

## 2011-08-24 DIAGNOSIS — R5381 Other malaise: Secondary | ICD-10-CM | POA: Diagnosis not present

## 2011-08-24 DIAGNOSIS — I2510 Atherosclerotic heart disease of native coronary artery without angina pectoris: Secondary | ICD-10-CM

## 2011-08-24 DIAGNOSIS — E119 Type 2 diabetes mellitus without complications: Secondary | ICD-10-CM

## 2011-08-24 DIAGNOSIS — I10 Essential (primary) hypertension: Secondary | ICD-10-CM | POA: Diagnosis not present

## 2011-08-24 DIAGNOSIS — R0602 Shortness of breath: Secondary | ICD-10-CM

## 2011-08-24 DIAGNOSIS — I319 Disease of pericardium, unspecified: Secondary | ICD-10-CM | POA: Diagnosis not present

## 2011-08-24 DIAGNOSIS — R609 Edema, unspecified: Secondary | ICD-10-CM

## 2011-08-24 DIAGNOSIS — E7800 Pure hypercholesterolemia, unspecified: Secondary | ICD-10-CM

## 2011-08-24 DIAGNOSIS — D649 Anemia, unspecified: Secondary | ICD-10-CM

## 2011-08-24 MED ORDER — FUROSEMIDE 20 MG PO TABS: 20.0000 mg | ORAL_TABLET | Freq: Every day | ORAL | Status: DC

## 2011-08-24 MED ORDER — POTASSIUM CHLORIDE ER 10 MEQ PO TBCR: 20.0000 meq | EXTENDED_RELEASE_TABLET | Freq: Every day | ORAL | Status: DC

## 2011-08-24 MED ORDER — POTASSIUM CHLORIDE ER 10 MEQ PO TBCR: 10.0000 meq | EXTENDED_RELEASE_TABLET | Freq: Two times a day (BID) | ORAL | Status: DC

## 2011-08-24 NOTE — Assessment & Plan Note (Signed)
We have encouraged continued exercise, careful diet management in an effort to lose weight. 

## 2011-08-24 NOTE — Assessment & Plan Note (Signed)
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 Symptoms improving with prednisone and gentle diuresis. BNP normal level. No clinical signs of heart failure. Unable to exclude mild diastolic dysfunction/CHF. Presentation most consistent with underlying lung disease. She has followup with Dr. Delford Field in the next week.  We did mention he might want high-resolution CT of the chest, pulmonary function test, initiation of inhalers. Will defer management to him.

## 2011-08-24 NOTE — Telephone Encounter (Signed)
Spoke with Marcheta Grammes with [facility] per Dr. Mariah Milling need to discontinue the furosemide if they receive by escibe. Was to be sent to the local pharmacy [facility].

## 2011-08-24 NOTE — Assessment & Plan Note (Signed)
Currently with no symptoms of angina. No further workup at this time. Continue current medication regimen. Shortness of breath likely secondary to underlying lung disease. If symptoms persist despite optimal pulmonary management, repeat stress test could be done.

## 2011-08-24 NOTE — Assessment & Plan Note (Signed)
We have suggested she goes to monitor her blood pressure at home. She reports having back pain from being in the emergency room and this is why her blood pressure is elevated. She does have chronic back pain.

## 2011-08-24 NOTE — Progress Notes (Signed)
Patient ID: [name], female    DOB: 1946-04-11, 66 y.o.   MRN: [id number]  HPI Comments: [name] is a 66 year old woman with a history of hypertension, hyperlipidemia, diabetes, COPD, chronic cough and coronary artery disease, status post two-vessel angioplasty with Promus drug-eluting stents in May 2009, chronic CP and SOB.     She was Admitted to [facility] 12/20- 05/28/2011 for acute respiratory failure due to bronchitis and COPD exacerbation. Also with anemia Initial CXR- showed  Pulmonary interstitial prominence. 2 D echo- Normal EF , essentially normal., Placed on Levaquin, prednisone and short course of diuresis with IV lasix. Symptoms resolved.   Hbg 8.5.  Due to CAD, transfused 1 Unit PRBCs.  DM- a1c 8.4.  Recent phone call to our office for edema and shortness of breath, sent to the emergency room for further evaluation. She was given diuresis and steroids and reports mild improvement of her shortness of breath. BNP was low at 120,  chest x-ray showed interstitial prominence. She reports last hemoglobin was 11.4. She is receiving iron infusions. ER physician and radiologist did not feel symptoms were secondary to CHF and heart failure and she was discharged home with prednisone and Lasix.  Myoview 01/2009 EF 72% normal perfusion EKG shows normal sinus rhythm with rate 56 beats per minute with no significant ST or T wave changes         Outpatient Encounter Prescriptions as of 67/22/2013  Medication Sig Dispense Refill  . Calcium-Vitamin D (CALTRATE 600 PLUS-VIT D PO) Take 1 tablet by mouth daily.        Marland Kitchen FLUoxetine (PROZAC) 40 MG capsule Take 1 capsule (40 mg total) by mouth 2 (two) times daily.  180 capsule  3  . glucose blood test strip (OneTouch Ultra test strips) use to check blood sugar four times daily  100 each  12  . insulin glargine (LANTUS SOLOSTAR) 100 UNIT/ML injection Inject 82 Units into the skin 2 (two) times daily.  14760 mL  3  . insulin lispro (HUMALOG)  100 UNIT/ML injection Inject 10 Units into the skin 3 (three) times daily before meals.        Marland Kitchen ipratropium-albuterol (DUONEB) 0.5-2.5 (3) MG/3ML SOLN Take 3 mLs by nebulization every 4 (four) hours as needed.  90 mL  1  . lansoprazole (PREVACID) 30 MG capsule TAKE 1 CAPSULE DAILY  90 capsule  3  . levothyroxine (SYNTHROID, LEVOTHROID) 150 MCG tablet Take 150 mcg by mouth daily.        Marland Kitchen loratadine (CLARITIN) 10 MG tablet Take 10 mg by mouth daily as needed.      . nitroGLYCERIN (NITROLINGUAL) 0.4 MG/SPRAY spray Place 1 spray under the tongue as needed. Chest pain       . predniSONE (DELTASONE) 20 MG tablet Take 20 mg by mouth as directed.       . traZODone (DESYREL) 100 MG tablet Take three tablets by mouth at bedtime  90 tablet  3    Review of Systems  Constitutional: Positive for fatigue.  HENT: Negative.   Eyes: Negative.   Respiratory: Positive for shortness of breath.   Cardiovascular: Positive for leg swelling.  Gastrointestinal: Negative.   Musculoskeletal: Negative.   Skin: Negative.   Neurological: Negative.   Hematological: Negative.   Psychiatric/Behavioral: Negative.   All other systems reviewed and are negative.  BP 152/73  Pulse 73  Wt 188 lb (85.276 kg)  Physical Exam  Nursing note and vitals reviewed. Constitutional: She is oriented  to person, place, and time. She appears well-developed and well-nourished.  HENT:  Head: Normocephalic.  Nose: Nose normal.  Mouth/Throat: Oropharynx is clear and moist.  Eyes: Conjunctivae are normal. Pupils are equal, round, and reactive to light.  Neck: Normal range of motion. Neck supple. No JVD present.  Cardiovascular: Normal rate, regular rhythm, S1 normal, S2 normal, normal heart sounds and intact distal pulses.  Exam reveals no gallop and no friction rub.   No murmur heard. Pulmonary/Chest: Effort normal and breath sounds normal. No respiratory distress. She has no wheezes. She has no rales. She exhibits no tenderness.    Abdominal: Soft. Bowel sounds are normal. She exhibits no distension. There is no tenderness.  Musculoskeletal: Normal range of motion. She exhibits no edema and no tenderness.  Lymphadenopathy:    She has no cervical adenopathy.  Neurological: She is alert and oriented to person, place, and time. Coordination normal.  Skin: Skin is warm and dry. No rash noted. No erythema.  Psychiatric: She has a normal mood and affect. Her behavior is normal. Judgment and thought content normal.         Assessment and Plan

## 2011-08-24 NOTE — Telephone Encounter (Signed)
Message copied by Eliezer Bottom on Fri Aug 24, 2011 11:58 AM ------      Message from: Dianne Dun      Created: Fri Aug 24, 2011 11:27 AM       Pt is on my schedule for Monday for "pulm referral."      Please let her know she does not need an appt.  If she tells me where she would like to go and why, I can place referral without seeing her      Thanks,      Jovita Gamma

## 2011-08-24 NOTE — Assessment & Plan Note (Addendum)
Stable hemoglobin 11.4 by her report.

## 2011-08-24 NOTE — Patient Instructions (Signed)
You are doing well. Please take lasix PRN for shortness of breath with potassium. No more than three times a week to avoid dehydration.  Please call us if you have new issues that need to be addressed before your next appt.  Your physician wants you to follow-up in: 6 months.  You will receive a reminder letter in the mail two months in advance. If you don't receive a letter, please call our office to schedule the follow-up appointment.

## 2011-08-24 NOTE — Assessment & Plan Note (Addendum)
No significant edema on today's evaluation. The emergency room physician reported no significant edema when she presented there several days ago prior to diuretic initiation.

## 2011-08-24 NOTE — Assessment & Plan Note (Signed)
She has been intolerant of statins in the past.

## 2011-08-24 NOTE — Telephone Encounter (Signed)
Advised pt.  She has no preference who she is referred to , just wants someone "good", does not want to see Dr. Meredeth Ide.  Appt for Monday cancelled.

## 2011-08-27 ENCOUNTER — Other Ambulatory Visit: Payer: Self-pay | Admitting: *Deleted

## 2011-08-27 ENCOUNTER — Ambulatory Visit: Payer: Medicare Other | Admitting: Family Medicine

## 2011-08-27 MED ORDER — POTASSIUM CHLORIDE ER 10 MEQ PO TBCR: 20.0000 meq | EXTENDED_RELEASE_TABLET | Freq: Every day | ORAL | Status: DC

## 2011-08-29 ENCOUNTER — Other Ambulatory Visit: Payer: Self-pay | Admitting: *Deleted

## 2011-08-29 MED ORDER — TRAZODONE HCL 100 MG PO TABS: ORAL_TABLET | ORAL | Status: DC

## 2011-08-29 NOTE — Telephone Encounter (Signed)
Faxed request from [facility], last filled 07/30/11.

## 2011-08-30 ENCOUNTER — Encounter: Payer: Self-pay | Admitting: *Deleted

## 2011-08-30 ENCOUNTER — Ambulatory Visit (INDEPENDENT_AMBULATORY_CARE_PROVIDER_SITE_OTHER): Payer: Medicare Other | Admitting: Critical Care Medicine

## 2011-08-30 ENCOUNTER — Encounter: Payer: Self-pay | Admitting: Critical Care Medicine

## 2011-08-30 ENCOUNTER — Ambulatory Visit (HOSPITAL_BASED_OUTPATIENT_CLINIC_OR_DEPARTMENT_OTHER)
Admission: RE | Admit: 2011-08-30 | Discharge: 2011-08-30 | Disposition: A | Discharge status: Home / Self Care | Payer: Medicare Other | Source: Ambulatory Visit | Attending: Critical Care Medicine | Admitting: Critical Care Medicine

## 2011-08-30 VITALS — BP 124/68 | HR 74 | Temp 97.9°F | Ht 66.0 in | Wt 179.0 lb

## 2011-08-30 DIAGNOSIS — R0989 Other specified symptoms and signs involving the circulatory and respiratory systems: Secondary | ICD-10-CM | POA: Diagnosis not present

## 2011-08-30 DIAGNOSIS — M47814 Spondylosis without myelopathy or radiculopathy, thoracic region: Secondary | ICD-10-CM | POA: Diagnosis not present

## 2011-08-30 DIAGNOSIS — J8410 Pulmonary fibrosis, unspecified: Secondary | ICD-10-CM

## 2011-08-30 DIAGNOSIS — R0602 Shortness of breath: Secondary | ICD-10-CM | POA: Diagnosis not present

## 2011-08-30 DIAGNOSIS — R911 Solitary pulmonary nodule: Secondary | ICD-10-CM

## 2011-08-30 DIAGNOSIS — R0600 Dyspnea, unspecified: Secondary | ICD-10-CM

## 2011-08-30 DIAGNOSIS — I2510 Atherosclerotic heart disease of native coronary artery without angina pectoris: Secondary | ICD-10-CM | POA: Diagnosis not present

## 2011-08-30 DIAGNOSIS — I700 Atherosclerosis of aorta: Secondary | ICD-10-CM

## 2011-08-30 DIAGNOSIS — R0609 Other forms of dyspnea: Secondary | ICD-10-CM | POA: Diagnosis not present

## 2011-08-30 MED ORDER — FUROSEMIDE 20 MG PO TABS: 40.0000 mg | ORAL_TABLET | Freq: Every day | ORAL | Status: DC

## 2011-08-30 MED ORDER — PREDNISONE 10 MG PO TABS: ORAL_TABLET | ORAL | Status: DC

## 2011-08-30 MED ORDER — BUDESONIDE-FORMOTEROL FUMARATE 160-4.5 MCG/ACT IN AERO: 2.0000 | INHALATION_SPRAY | Freq: Two times a day (BID) | RESPIRATORY_TRACT | Status: DC

## 2011-08-30 NOTE — Telephone Encounter (Signed)
Phone msg created in error - pls disregard

## 2011-08-30 NOTE — Assessment & Plan Note (Signed)
Interstitial disease on chest radiograph needs to be confirmed with CT scanning and associated airway obstruction by history as cause for shortness of breath Plan  A CT of chest today will be done Full Pulmonary function study will be obtained Start Symbicort two puff twice daily Start prednisone 10mg  Take 4 for three days 3 for three days 2 for three days 1 for three days and stop Use nebulizer as needed Increase lasix to 40mg  daily Labs today Return next to [facility] [address] [facility] [place], first available work in appointment Soon after all above completed

## 2011-08-30 NOTE — Patient Instructions (Signed)
A CT of chest today will be done Full Pulmonary function study will be obtained Start Symbicort two puff twice daily Start prednisone 10mg  Take 4 for three days 3 for three days 2 for three days 1 for three days and stop Use nebulizer as needed Increase lasix to 40mg  daily Labs today Return next to [facility] [address] [facility] [place], first available work in appointment Soon after all above completed

## 2011-08-30 NOTE — Progress Notes (Signed)
Subjective:    Patient ID: [name], female    DOB: [birth year], 66 y.o.   MRN: [id number]  HPI Comments: Pt hosp in 12/12 for acute bronchitis and resp failure at [facility]  Rx BD nebs. Despite this , back in ED one week ago d/t edema and BP elevated. Rx lasix and pred. Not improved  Shortness of Breath This is a recurrent problem. The current episode started more than 1 month ago. The problem occurs constantly (dyspneic at rest and exertion). The problem has been gradually worsening. Associated symptoms include chest pain, ear pain, headaches, leg swelling, orthopnea, PND, sputum production and wheezing. Pertinent negatives include no abdominal pain, fever, hemoptysis, leg pain, neck pain, rash, rhinorrhea, sore throat or vomiting. The symptoms are aggravated by weather changes, URIs, smoke, fumes, odors, eating, lying flat, exercise and any activity (cleaners, hairspray). Associated symptoms comments: Notes anterior chest pain and tightness Has productive cough of green mucus once. Risk factors include smoking. She has tried beta agonist inhalers and ipratropium inhalers for the symptoms. The treatment provided no relief. Her past medical history is significant for asthma, CAD, chronic lung disease, COPD and pneumonia. There is no history of aspirin allergies, bronchiolitis, DVT, a heart failure, PE or a recent surgery.   Pt allergic to food , multiple environmental allergies.  On immunotherapy for 57yrs  Past Medical History  Diagnosis Date  . Allergic rhinitis   . Diabetes mellitus     type 2  . Diverticulitis of colon   . GERD (gastroesophageal reflux disease)   . Chronic depression   . Hypercholesterolemia     intolerance of statins and niaspan  . Apnea, sleep     mild, intolerant of cpap  . Coronary artery disease   . ACE-inhibitor cough   . Cataract   . Chronic headache      Family History  Problem Relation Age of Onset  . Breast cancer Mother     breast  . Hypertension  Father   . Cancer Father     mesothelioma  . Asthma Father   . Stroke Paternal Grandfather   . Heart disease       History   Social History  . Marital Status: Married    Spouse Name: N/A    Number of Children: 3  . Years of Education: N/A   Occupational History  . Retired     Lawyer   Social History Main Topics  . Smoking status: Former Smoker -- 1.0 packs/day for 30 years    Types: Cigarettes    Quit date: 02/03/2008  . Smokeless tobacco: Never Used  . Alcohol Use: No  . Drug Use: Not on file  . Sexually Active: Not on file   Other Topics Concern  . Not on file   Social History Narrative  . No narrative on file     Allergies  Allergen Reactions  . Atorvastatin   . Bupropion Hcl   . Codeine Sulfate   . Gabapentin   . Latex   . Lisinopril     REACTION: cough  . Meperidine Hcl   . Metformin     REACTION: unspecified  . Mirtazapine   . Mometasone Furoate     REACTION: Nause and vomiting  . Morphine Sulfate     REACTION: Chest pain  . Olanzapine   . Other     Beta Blockers, reaction unknown but listed  . Oxycodone-Acetaminophen   . Propoxacet-N   . Rosuvastatin  REACTION: Makes patient weak.  . Shellfish Allergy   . Venlafaxine     REACTION: unspecified  . Zolpidem Tartrate     REACTION: Jittery, diarrhea     Outpatient Prescriptions Prior to Visit  Medication Sig Dispense Refill  . Calcium-Vitamin D (CALTRATE 600 PLUS-VIT D PO) Take 1 tablet by mouth 2 (two) times daily.       Marland Kitchen FLUoxetine (PROZAC) 40 MG capsule Take 1 capsule (40 mg total) by mouth 2 (two) times daily.  180 capsule  3  . glucose blood test strip (OneTouch Ultra test strips) use to check blood sugar four times daily  100 each  12  . insulin glargine (LANTUS SOLOSTAR) 100 UNIT/ML injection Inject 82 Units into the skin 2 (two) times daily.  14760 mL  3  . insulin lispro (HUMALOG) 100 UNIT/ML injection Inject 10 Units into the skin 3 (three) times daily before meals. Along with  Sliding Scale      . ipratropium-albuterol (DUONEB) 0.5-2.5 (3) MG/3ML SOLN Take 3 mLs by nebulization every 4 (four) hours as needed.  90 mL  1  . lansoprazole (PREVACID) 30 MG capsule TAKE 1 CAPSULE DAILY  90 capsule  3  . levothyroxine (SYNTHROID, LEVOTHROID) 150 MCG tablet Take 150 mcg by mouth daily.        Marland Kitchen loratadine (CLARITIN) 10 MG tablet Take 10 mg by mouth daily as needed.      . nitroGLYCERIN (NITROLINGUAL) 0.4 MG/SPRAY spray Place 1 spray under the tongue as needed. Chest pain       . potassium chloride (K-DUR) 10 MEQ tablet Take 2 tablets (20 mEq total) by mouth daily. Take with lasix only  60 tablet  6  . traZODone (DESYREL) 100 MG tablet Take three tablets by mouth at bedtime  90 tablet  0  . furosemide (LASIX) 20 MG tablet Take 1 tablet (20 mg total) by mouth daily.  30 tablet  11  . traZODone (DESYREL) 100 MG tablet Take 1 tablet (100 mg total) by mouth 3 (three) times daily.  90 tablet  0  . predniSONE (DELTASONE) 20 MG tablet Take 20 mg by mouth as directed.          Review of Systems  Constitutional: Positive for activity change, fatigue and unexpected weight change. Negative for fever, chills, diaphoresis and appetite change.  HENT: Positive for hearing loss, ear pain, nosebleeds, facial swelling, trouble swallowing, postnasal drip, sinus pressure and tinnitus. Negative for congestion, sore throat, rhinorrhea, sneezing, mouth sores, neck pain, neck stiffness, dental problem, voice change and ear discharge.   Eyes: Positive for itching and visual disturbance. Negative for photophobia and discharge.  Respiratory: Positive for sputum production, shortness of breath and wheezing. Negative for apnea, cough, hemoptysis, choking, chest tightness and stridor.   Cardiovascular: Positive for chest pain, orthopnea, leg swelling and PND. Negative for palpitations.  Gastrointestinal: Positive for abdominal distention. Negative for nausea, vomiting, abdominal pain, constipation and blood  in stool.  Genitourinary: Positive for urgency and frequency. Negative for dysuria, hematuria, flank pain, decreased urine volume and difficulty urinating.  Musculoskeletal: Positive for back pain, joint swelling, arthralgias and gait problem. Negative for myalgias.  Skin: Negative for color change, pallor and rash.  Neurological: Positive for dizziness, weakness, light-headedness and headaches. Negative for tremors, seizures, syncope, speech difficulty and numbness.  Hematological: Negative for adenopathy. Does not bruise/bleed easily.  Psychiatric/Behavioral: Positive for confusion and agitation. Negative for sleep disturbance. The patient is nervous/anxious.  Objective:   Physical Exam Filed Vitals:   08/30/11 1046  BP: 124/68  Pulse: 74  Temp: 97.9 F (36.6 C)  TempSrc: Oral  Height: 5\' 6"  (1.676 m)  Weight: 179 lb (81.194 kg)  SpO2: 98%    Gen: Pleasant, well-nourished, in no distress,  normal affect  ENT: No lesions,  mouth clear,  oropharynx clear, no postnasal drip  Neck: No JVD, no TMG, no carotid bruits  Lungs: No use of accessory muscles, no dullness to percussion, spine wheezes with poor airflow and bibasilar rales Cardiovascular: RRR, heart sounds normal, no murmur or gallops, 2+  peripheral edema  Abdomen: soft and NT, no HSM,  BS normal  Musculoskeletal: No deformities, no cyanosis or clubbing  Neuro: alert, non focal  Skin: Warm, no lesions or rashes  Ct Chest Wo Contrast  08/30/2011  *RADIOLOGY REPORT*  Clinical Data: Evaluate for interstitial lung disease.  Extreme shortness of breath for several weeks.  CT CHEST WITHOUT CONTRAST  Technique:  Multidetector CT imaging of the chest was performed following the standard protocol without IV contrast.  Comparison: 03/01/2009  Findings:  No enlarged axillary or supraclavicular lymph nodes.  No enlarged mediastinal or hilar lymph nodes.  No pericardial effusion.  Calcification involving the RCA, LAD and left  circumflex coronary arteries noted.  The lungs appear clear.  No airspace consolidation identified.  No significant interstitial reticulation or honeycombing identified.  The tracheobronchial tree appears normal.  Tiny nodule in the left base measures 1.3 mm, image 41.  Mild multilevel spondylosis within the thoracic spine identified.  Review of the visualized portions of the upper abdomen are negative.  IMPRESSION:  1.  No acute cardiopulmonary abnormalities. 2.  There is atherosclerosis of the thoracic aorta, the great vessels of the mediastinum and the coronary arteries, including calcified atherosclerotic plaque in the RCA, LAD, and left circumflex coronary arteries.  Atherosclerosis, including three-vessel coronary artery disease. Please note that although the presence of coronary artery calcium documents the presence of coronary artery disease, the severity of this disease and any potential stenosis cannot be assessed on this non-gated CT examination.  Assessment for potential risk factor modification, dietary therapy or pharmacologic therapy may be warranted, if clinically indicated. 3.  Tiny nodule in the left base. If the patient is at high risk for bronchogenic carcinoma, follow-up chest CT at 1 year is recommended.  If the patient is at low risk, no follow-up is needed.  This recommendation follows the consensus statement: Guidelines for Management of Small Pulmonary Nodules Detected on CT Scans:  A Statement from the Fleischner Society as published in Radiology 2005; 237:395-400.  Original Report Authenticated By: Rosealee Albee, M.D.          Assessment & Plan:   Shortness of breath Interstitial disease on chest radiograph needs to be confirmed with CT scanning and associated airway obstruction by history as cause for shortness of breath Plan  A CT of chest today will be done Full Pulmonary function study will be obtained Start Symbicort two puff twice daily Start prednisone 10mg  Take  4 for three days 3 for three days 2 for three days 1 for three days and stop Use nebulizer as needed Increase lasix to 40mg  daily Labs today Return next to [facility] Dr Heber Witherbee, first available work in appointment Soon after all above completed     note see CT chest findings above which do not demonstrate interstitial lung disease and do not show air space disease on  CT scanning therefore pursuing bronchodilator for therapy and anti-inflammatory therapy would be important We'll also followup serologic studies  Updated Medication List Outpatient Encounter Prescriptions as of 08/30/2011  Medication Sig Dispense Refill  . BD INSULIN SYRINGE ULTRAFINE 31G X 5/16" 0.3 ML MISC Use as directed      . Calcium-Vitamin D (CALTRATE 600 PLUS-VIT D PO) Take 1 tablet by mouth 2 (two) times daily.       Marland Kitchen FLUoxetine (PROZAC) 40 MG capsule Take 1 capsule (40 mg total) by mouth 2 (two) times daily.  180 capsule  3  . furosemide (LASIX) 20 MG tablet Take 2 tablets (40 mg total) by mouth daily.  30 tablet  11  . glucose blood test strip (OneTouch Ultra test strips) use to check blood sugar four times daily  100 each  12  . insulin glargine (LANTUS SOLOSTAR) 100 UNIT/ML injection Inject 82 Units into the skin 2 (two) times daily.  14760 mL  3  . insulin lispro (HUMALOG) 100 UNIT/ML injection Inject 10 Units into the skin 3 (three) times daily before meals. Along with Sliding Scale      . ipratropium-albuterol (DUONEB) 0.5-2.5 (3) MG/3ML SOLN Take 3 mLs by nebulization every 4 (four) hours as needed.  90 mL  1  . IRON PO Take by mouth. Pt takes iron infusions every 6 weeks until September      . lansoprazole (PREVACID) 30 MG capsule TAKE 1 CAPSULE DAILY  90 capsule  3  . levothyroxine (SYNTHROID, LEVOTHROID) 150 MCG tablet Take 150 mcg by mouth daily.        Marland Kitchen loratadine (CLARITIN) 10 MG tablet Take 10 mg by mouth daily as needed.      . nitroGLYCERIN (NITROLINGUAL) 0.4 MG/SPRAY spray Place 1 spray  under the tongue as needed. Chest pain       . potassium chloride (K-DUR) 10 MEQ tablet Take 2 tablets (20 mEq total) by mouth daily. Take with lasix only  60 tablet  6  . traZODone (DESYREL) 100 MG tablet Take three tablets by mouth at bedtime  90 tablet  0  . DISCONTD: furosemide (LASIX) 20 MG tablet Take 1 tablet (20 mg total) by mouth daily.  30 tablet  11  . budesonide-formoterol (SYMBICORT) 160-4.5 MCG/ACT inhaler Inhale 2 puffs into the lungs 2 (two) times daily.  1 Inhaler  12  . predniSONE (DELTASONE) 10 MG tablet Take 4 for three days 3 for three days 2 for three days 1 for three days and stop  30 tablet  0  . traZODone (DESYREL) 100 MG tablet Take 1 tablet (100 mg total) by mouth 3 (three) times daily.  90 tablet  0  . DISCONTD: predniSONE (DELTASONE) 20 MG tablet Take 20 mg by mouth as directed.

## 2011-08-31 LAB — RHEUMATOID FACTOR: Rhuematoid fact SerPl-aCnc: 80 IU/mL — ABNORMAL HIGH (ref <=14)

## 2011-08-31 LAB — ALLERGY FULL PROFILE
Allergen, D pternoyssinus,d7: 0.27 kU/L (ref <0.35)
Allergen,Goose feathers, e70: <0.1 kU/L (ref <0.35)
Aspergillus fumigatus, IgG: <0.1 kU/L (ref <0.35)
Common Ragweed: 0.18 kU/L (ref <0.35)
D. farinae: 0.34 kU/L (ref <0.35)
Dog Dander: 1.43 kU/L — ABNORMAL HIGH (ref <0.35)
Goldenrod: 0.12 kU/L (ref <0.35)
Helminthosporium halodes: <0.1 kU/L (ref <0.35)
House Dust Hollister: 1.34 kU/L — ABNORMAL HIGH (ref <0.35)
IgE (Immunoglobulin E), Serum: 64.8 IU/mL (ref 0.0–180.0)
Plantain: 0.31 kU/L (ref <0.35)
Stemphylium Botryosum: <0.1 kU/L (ref <0.35)

## 2011-08-31 LAB — BASIC METABOLIC PANEL
BUN: 13 mg/dL (ref 6–23)
CO2: 27 mEq/L (ref 19–32)
Calcium: 8.5 mg/dL (ref 8.4–10.5)
Creat: 0.67 mg/dL (ref 0.50–1.10)

## 2011-08-31 LAB — ANGIOTENSIN CONVERTING ENZYME: Angiotensin-Converting Enzyme: 22 U/L (ref 8–52)

## 2011-09-03 ENCOUNTER — Ambulatory Visit: Payer: Self-pay | Admitting: Unknown Physician Specialty

## 2011-09-03 ENCOUNTER — Ambulatory Visit: Payer: Self-pay | Admitting: Internal Medicine

## 2011-09-03 DIAGNOSIS — Z79899 Other long term (current) drug therapy: Secondary | ICD-10-CM | POA: Diagnosis not present

## 2011-09-03 DIAGNOSIS — E039 Hypothyroidism, unspecified: Secondary | ICD-10-CM | POA: Diagnosis not present

## 2011-09-03 DIAGNOSIS — Z9889 Other specified postprocedural states: Secondary | ICD-10-CM | POA: Diagnosis not present

## 2011-09-03 DIAGNOSIS — D472 Monoclonal gammopathy: Secondary | ICD-10-CM | POA: Diagnosis not present

## 2011-09-03 DIAGNOSIS — Z7982 Long term (current) use of aspirin: Secondary | ICD-10-CM | POA: Diagnosis not present

## 2011-09-03 DIAGNOSIS — G4730 Sleep apnea, unspecified: Secondary | ICD-10-CM | POA: Diagnosis not present

## 2011-09-03 DIAGNOSIS — D126 Benign neoplasm of colon, unspecified: Secondary | ICD-10-CM | POA: Diagnosis not present

## 2011-09-03 DIAGNOSIS — K219 Gastro-esophageal reflux disease without esophagitis: Secondary | ICD-10-CM | POA: Diagnosis not present

## 2011-09-03 DIAGNOSIS — Z90710 Acquired absence of both cervix and uterus: Secondary | ICD-10-CM | POA: Diagnosis not present

## 2011-09-03 DIAGNOSIS — D128 Benign neoplasm of rectum: Secondary | ICD-10-CM | POA: Diagnosis not present

## 2011-09-03 DIAGNOSIS — D129 Benign neoplasm of anus and anal canal: Secondary | ICD-10-CM | POA: Diagnosis not present

## 2011-09-03 DIAGNOSIS — K648 Other hemorrhoids: Secondary | ICD-10-CM | POA: Diagnosis not present

## 2011-09-03 DIAGNOSIS — K2990 Gastroduodenitis, unspecified, without bleeding: Secondary | ICD-10-CM | POA: Diagnosis not present

## 2011-09-03 DIAGNOSIS — Z794 Long term (current) use of insulin: Secondary | ICD-10-CM | POA: Diagnosis not present

## 2011-09-03 DIAGNOSIS — F329 Major depressive disorder, single episode, unspecified: Secondary | ICD-10-CM | POA: Diagnosis not present

## 2011-09-03 DIAGNOSIS — Z803 Family history of malignant neoplasm of breast: Secondary | ICD-10-CM | POA: Diagnosis not present

## 2011-09-03 DIAGNOSIS — I10 Essential (primary) hypertension: Secondary | ICD-10-CM | POA: Diagnosis not present

## 2011-09-03 DIAGNOSIS — Z9049 Acquired absence of other specified parts of digestive tract: Secondary | ICD-10-CM | POA: Diagnosis not present

## 2011-09-03 DIAGNOSIS — K2970 Gastritis, unspecified, without bleeding: Secondary | ICD-10-CM | POA: Diagnosis not present

## 2011-09-03 DIAGNOSIS — K5730 Diverticulosis of large intestine without perforation or abscess without bleeding: Secondary | ICD-10-CM | POA: Diagnosis not present

## 2011-09-03 DIAGNOSIS — Z9861 Coronary angioplasty status: Secondary | ICD-10-CM | POA: Diagnosis not present

## 2011-09-03 DIAGNOSIS — E119 Type 2 diabetes mellitus without complications: Secondary | ICD-10-CM | POA: Diagnosis not present

## 2011-09-03 DIAGNOSIS — I2510 Atherosclerotic heart disease of native coronary artery without angina pectoris: Secondary | ICD-10-CM | POA: Diagnosis not present

## 2011-09-03 DIAGNOSIS — J449 Chronic obstructive pulmonary disease, unspecified: Secondary | ICD-10-CM | POA: Diagnosis not present

## 2011-09-03 DIAGNOSIS — K319 Disease of stomach and duodenum, unspecified: Secondary | ICD-10-CM | POA: Diagnosis not present

## 2011-09-03 DIAGNOSIS — D509 Iron deficiency anemia, unspecified: Secondary | ICD-10-CM | POA: Diagnosis not present

## 2011-09-04 ENCOUNTER — Ambulatory Visit: Payer: Self-pay | Admitting: Internal Medicine

## 2011-09-04 NOTE — Progress Notes (Signed)
Quick Note:  Called, spoke with pt. I informed her labs show multiple allergies per PW. Advised he will review this in detail with her at next OV. She verbalized understanding and voiced no further questions/concerns at this time. ______

## 2011-09-06 LAB — HYPERSENSITIVITY PNUEMONITIS PROFILE

## 2011-09-11 ENCOUNTER — Encounter: Payer: Self-pay | Admitting: Family Medicine

## 2011-09-12 ENCOUNTER — Institutional Professional Consult (permissible substitution): Payer: Medicare Other | Admitting: Cardiovascular Disease

## 2011-09-14 ENCOUNTER — Ambulatory Visit: Payer: Medicare Other

## 2011-09-17 ENCOUNTER — Ambulatory Visit (INDEPENDENT_AMBULATORY_CARE_PROVIDER_SITE_OTHER): Payer: Medicare Other | Admitting: Critical Care Medicine

## 2011-09-17 ENCOUNTER — Encounter: Payer: Self-pay | Admitting: Critical Care Medicine

## 2011-09-17 VITALS — BP 160/68 | HR 67 | Temp 97.7°F | Ht 66.0 in | Wt 189.0 lb

## 2011-09-17 DIAGNOSIS — R0602 Shortness of breath: Secondary | ICD-10-CM | POA: Diagnosis not present

## 2011-09-17 MED ORDER — ALBUTEROL SULFATE HFA 108 (90 BASE) MCG/ACT IN AERS: 2.0000 | INHALATION_SPRAY | Freq: Four times a day (QID) | RESPIRATORY_TRACT | Status: DC | PRN

## 2011-09-17 MED ORDER — TIOTROPIUM BROMIDE MONOHYDRATE 18 MCG IN CAPS: 18.0000 ug | ORAL_CAPSULE | Freq: Every day | RESPIRATORY_TRACT | Status: DC

## 2011-09-17 NOTE — Assessment & Plan Note (Signed)
Moderate persistent asthma with normal spirometry Intolerant of inhaled steroids and LABA No CT evidence for interstitial lung disease Serology borderline pos ANA, RF but no clinical evidence for autoimmune disease IgE 64 with multiple positive environmental allergens.  Hypersensitivity panel neg  Plan  Trial Spiriva for BD therapy.  ICS intolerant.  Use SABA prn. No systemic steroids Return 2 months with [facility]Quaid in [place] office

## 2011-09-17 NOTE — Patient Instructions (Signed)
Trial Spiriva Stop symbicort Use proventil/albuterol as needed for shortness breath Return [place] for follow up with dr Kendrick Fries

## 2011-09-17 NOTE — Progress Notes (Signed)
Subjective:    Patient ID: [name], female    DOB: May 27, 1946, 66 y.o.   MRN: [id number]  HPI Comments: Pt hosp in 12/12 for acute bronchitis and resp failure at [facility] ([place])  Rx BD nebs. Despite this , back in ED one week ago d/t edema and BP elevated. Rx lasix and pred. Not improved  Shortness of Breath This is a recurrent problem. The current episode started more than 1 month ago. The problem occurs constantly (dyspneic at rest and exertion). The problem has been gradually worsening. Associated symptoms include chest pain, ear pain, headaches, leg swelling, orthopnea, PND, sputum production and wheezing. Pertinent negatives include no abdominal pain, fever, hemoptysis, leg pain, neck pain, rash, rhinorrhea, sore throat or vomiting. The symptoms are aggravated by weather changes, URIs, smoke, fumes, odors, eating, lying flat, exercise and any activity (cleaners, hairspray). Associated symptoms comments: Notes anterior chest pain and tightness Has productive cough of green mucus once. Risk factors include smoking. She has tried beta agonist inhalers and ipratropium inhalers for the symptoms. The treatment provided no relief. Her past medical history is significant for asthma, CAD, chronic lung disease, COPD and pneumonia. There is no history of aspirin allergies, bronchiolitis, DVT, a heart failure, PE or a recent surgery.   Pt allergic to food , multiple environmental allergies.  On immunotherapy for 28yrs  09/17/2011 At last ov we started symbicort.  Pt not able to tolerate symbicort and stopped  Pt just seen by cardiology and declared LVfunction ok, no ischemia. Not able to tolerate symbicort.  Pt had palpitations and tremor. The pt can tolerate albuterol ok No cough.  Pred did help dyspnea.  Lasix is daily and helps the edema. Pt denies any significant sore throat, nasal congestion or excess secretions, fever, chills, sweats, unintended weight loss, pleurtic or exertional chest pain, orthopnea  PND, or leg swelling Pt denies any increase in rescue therapy over baseline, denies waking up needing it or having any early am or nocturnal exacerbations of coughing/wheezing/or dyspnea. Pt also denies any obvious fluctuation in symptoms with  weather or environmental change or other alleviating or aggravating factors     Past Medical History  Diagnosis Date  . Allergic rhinitis   . Diabetes mellitus     type 2  . Diverticulitis of colon   . GERD (gastroesophageal reflux disease)   . Chronic depression   . Hypercholesterolemia     intolerance of statins and niaspan  . Apnea, sleep     mild, intolerant of cpap  . Coronary artery disease   . ACE-inhibitor cough   . Cataract   . Chronic headache      Family History  Problem Relation Age of Onset  . Breast cancer Mother     breast  . Hypertension Father   . Cancer Father     mesothelioma  . Asthma Father   . Stroke Paternal Grandfather   . Heart disease       History   Social History  . Marital Status: Married    Spouse Name: N/A    Number of Children: 3  . Years of Education: N/A   Occupational History  . Retired     Lawyer   Social History Main Topics  . Smoking status: Former Smoker -- 1.0 packs/day for 30 years    Types: Cigarettes    Quit date: 02/03/2008  . Smokeless tobacco: Never Used  . Alcohol Use: No  . Drug Use: Not on file  . Sexually  Active: Not on file   Other Topics Concern  . Not on file   Social History Narrative  . No narrative on file     Allergies  Allergen Reactions  . Symbicort     Shakiness, tremors  . Atorvastatin   . Bupropion Hcl   . Codeine Sulfate   . Gabapentin   . Latex   . Lisinopril     REACTION: cough  . Meperidine Hcl   . Metformin     REACTION: unspecified  . Mirtazapine   . Mometasone Furoate     REACTION: Nause and vomiting  . Morphine Sulfate     REACTION: Chest pain  . Olanzapine   . Other     Beta Blockers, reaction unknown but listed  .  Oxycodone-Acetaminophen   . Propoxacet-N   . Rosuvastatin     REACTION: Makes patient weak.  . Shellfish Allergy   . Venlafaxine     REACTION: unspecified  . Zolpidem Tartrate     REACTION: Jittery, diarrhea     Outpatient Prescriptions Prior to Visit  Medication Sig Dispense Refill  . BD INSULIN SYRINGE ULTRAFINE 31G X 5/16" 0.3 ML MISC Use as directed      . Calcium-Vitamin D (CALTRATE 600 PLUS-VIT D PO) Take 1 tablet by mouth 2 (two) times daily.       Marland Kitchen FLUoxetine (PROZAC) 40 MG capsule Take 1 capsule (40 mg total) by mouth 2 (two) times daily.  180 capsule  3  . furosemide (LASIX) 20 MG tablet Take 2 tablets (40 mg total) by mouth daily.  30 tablet  11  . glucose blood test strip (OneTouch Ultra test strips) use to check blood sugar four times daily  100 each  12  . insulin glargine (LANTUS SOLOSTAR) 100 UNIT/ML injection Inject 82 Units into the skin 2 (two) times daily.  14760 mL  3  . insulin lispro (HUMALOG) 100 UNIT/ML injection Inject 10 Units into the skin 3 (three) times daily before meals. Along with Sliding Scale      . IRON PO Take by mouth. Pt takes iron infusions every 6 weeks until September      . lansoprazole (PREVACID) 30 MG capsule TAKE 1 CAPSULE DAILY  90 capsule  3  . levothyroxine (SYNTHROID, LEVOTHROID) 150 MCG tablet Take 150 mcg by mouth daily.        Marland Kitchen loratadine (CLARITIN) 10 MG tablet Take 10 mg by mouth daily as needed.      . nitroGLYCERIN (NITROLINGUAL) 0.4 MG/SPRAY spray Place 1 spray under the tongue as needed. Chest pain       . potassium chloride (K-DUR) 10 MEQ tablet Take 2 tablets (20 mEq total) by mouth daily. Take with lasix only  60 tablet  6  . traZODone (DESYREL) 100 MG tablet Take three tablets by mouth at bedtime  90 tablet  0  . ipratropium-albuterol (DUONEB) 0.5-2.5 (3) MG/3ML SOLN Take 3 mLs by nebulization every 4 (four) hours as needed.  90 mL  1  . traZODone (DESYREL) 100 MG tablet Take 1 tablet (100 mg total) by mouth 3 (three) times  daily.  90 tablet  0  . budesonide-formoterol (SYMBICORT) 160-4.5 MCG/ACT inhaler Inhale 2 puffs into the lungs 2 (two) times daily.  1 Inhaler  12  . predniSONE (DELTASONE) 10 MG tablet Take 4 for three days 3 for three days 2 for three days 1 for three days and stop  30 tablet  0  Review of Systems  Constitutional: Positive for activity change, fatigue and unexpected weight change. Negative for fever, chills, diaphoresis and appetite change.  HENT: Positive for hearing loss, ear pain, nosebleeds, facial swelling, trouble swallowing, postnasal drip, sinus pressure and tinnitus. Negative for congestion, sore throat, rhinorrhea, sneezing, mouth sores, neck pain, neck stiffness, dental problem, voice change and ear discharge.   Eyes: Positive for itching and visual disturbance. Negative for photophobia and discharge.  Respiratory: Positive for sputum production, shortness of breath and wheezing. Negative for apnea, cough, hemoptysis, choking, chest tightness and stridor.   Cardiovascular: Positive for chest pain, orthopnea, leg swelling and PND. Negative for palpitations.  Gastrointestinal: Positive for abdominal distention. Negative for nausea, vomiting, abdominal pain, constipation and blood in stool.  Genitourinary: Positive for urgency and frequency. Negative for dysuria, hematuria, flank pain, decreased urine volume and difficulty urinating.  Musculoskeletal: Positive for back pain, joint swelling, arthralgias and gait problem. Negative for myalgias.  Skin: Negative for color change, pallor and rash.  Neurological: Positive for dizziness, weakness, light-headedness and headaches. Negative for tremors, seizures, syncope, speech difficulty and numbness.  Hematological: Negative for adenopathy. Does not bruise/bleed easily.  Psychiatric/Behavioral: Negative for confusion, sleep disturbance and agitation. The patient is nervous/anxious.        Objective:   Physical Exam  Filed Vitals:    09/17/11 1002  BP: 160/68  Pulse: 67  Temp: 97.7 F (36.5 C)  TempSrc: Oral  Height: 5\' 6"  (1.676 m)  Weight: 189 lb (85.73 kg)  SpO2: 98%    Gen: Pleasant, well-nourished, in no distress,  normal affect  ENT: No lesions,  mouth clear,  oropharynx clear, no postnasal drip  Neck: No JVD, no TMG, no carotid bruits  Lungs: No use of accessory muscles, no dullness to percussion, spine wheezes with poor airflow   Cardiovascular: RRR, heart sounds normal, no murmur or gallops, 1+  peripheral edema  Abdomen: soft and NT, no HSM,  BS normal  Musculoskeletal: No deformities, no cyanosis or clubbing  Neuro: alert, non focal  Skin: Warm, no lesions or rashes  CT Chest  3/28: No acute cardiopulmonary abn. Tiny nodule L base likely benign  Spirometry 4/15: FeV1 84%  FVC 77%          Assessment & Plan:   Extrinsic asthma, unspecified Moderate persistent asthma with normal spirometry Intolerant of inhaled steroids and LABA No CT evidence for interstitial lung disease Serology borderline pos ANA, RF but no clinical evidence for autoimmune disease IgE 64 with multiple positive environmental allergens.  Hypersensitivity panel neg  Plan  Trial Spiriva for BD therapy.  ICS intolerant.  Use SABA prn. No systemic steroids Return 2 months with [facility] in [place] office      Updated Medication List Outpatient Encounter Prescriptions as of 09/17/2011  Medication Sig Dispense Refill  . BD INSULIN SYRINGE ULTRAFINE 31G X 5/16" 0.3 ML MISC Use as directed      . Calcium-Vitamin D (CALTRATE 600 PLUS-VIT D PO) Take 1 tablet by mouth 2 (two) times daily.       Marland Kitchen FLUoxetine (PROZAC) 40 MG capsule Take 1 capsule (40 mg total) by mouth 2 (two) times daily.  180 capsule  3  . furosemide (LASIX) 20 MG tablet Take 2 tablets (40 mg total) by mouth daily.  30 tablet  11  . glucose blood test strip (OneTouch Ultra test strips) use to check blood sugar four times daily  100 each  12  .  insulin glargine (LANTUS SOLOSTAR)  100 UNIT/ML injection Inject 82 Units into the skin 2 (two) times daily.  14760 mL  3  . insulin lispro (HUMALOG) 100 UNIT/ML injection Inject 10 Units into the skin 3 (three) times daily before meals. Along with Sliding Scale      . IRON PO Take by mouth. Pt takes iron infusions every 6 weeks until September      . lansoprazole (PREVACID) 30 MG capsule TAKE 1 CAPSULE DAILY  90 capsule  3  . levothyroxine (SYNTHROID, LEVOTHROID) 150 MCG tablet Take 150 mcg by mouth daily.        Marland Kitchen loratadine (CLARITIN) 10 MG tablet Take 10 mg by mouth daily as needed.      . nitroGLYCERIN (NITROLINGUAL) 0.4 MG/SPRAY spray Place 1 spray under the tongue as needed. Chest pain       . potassium chloride (K-DUR) 10 MEQ tablet Take 2 tablets (20 mEq total) by mouth daily. Take with lasix only  60 tablet  6  . traZODone (DESYREL) 100 MG tablet Take three tablets by mouth at bedtime  90 tablet  0  . DISCONTD: ipratropium-albuterol (DUONEB) 0.5-2.5 (3) MG/3ML SOLN Take 3 mLs by nebulization every 4 (four) hours as needed.  90 mL  1  . albuterol (PROVENTIL HFA;VENTOLIN HFA) 108 (90 BASE) MCG/ACT inhaler Inhale 2 puffs into the lungs every 6 (six) hours as needed for wheezing or shortness of breath. As needed for wheezing  1 Inhaler  6  . tiotropium (SPIRIVA HANDIHALER) 18 MCG inhalation capsule Place 1 capsule (18 mcg total) into inhaler and inhale daily.  30 capsule  12  . traZODone (DESYREL) 100 MG tablet Take 1 tablet (100 mg total) by mouth 3 (three) times daily.  90 tablet  0  . DISCONTD: budesonide-formoterol (SYMBICORT) 160-4.5 MCG/ACT inhaler Inhale 2 puffs into the lungs 2 (two) times daily.  1 Inhaler  12  . DISCONTD: predniSONE (DELTASONE) 10 MG tablet Take 4 for three days 3 for three days 2 for three days 1 for three days and stop  30 tablet  0

## 2011-09-19 ENCOUNTER — Telehealth: Payer: Self-pay | Admitting: Cardiovascular Disease

## 2011-09-19 NOTE — Telephone Encounter (Signed)
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 Patient would like to know if she needs to be evaluated for that, and does she needs to be seen by Dr. Mariah Milling.

## 2011-09-19 NOTE — Telephone Encounter (Signed)
We know that she has coronary artery disease as she has had stent placed in the past Not surprised about coronary artery calcifications. Same thing. In the past we were hoping she could take a statin. She has been statin intolerant in the past Does she want to retry a cholesterol medication?

## 2011-09-19 NOTE — Telephone Encounter (Signed)
Pt has some concerns about a chest xray that she had. Showed calcium deposits around his heart.

## 2011-09-19 NOTE — Telephone Encounter (Signed)
Discussed with patient and she does not want to retry any cholesterol medications

## 2011-10-01 ENCOUNTER — Other Ambulatory Visit: Payer: Self-pay | Admitting: *Deleted

## 2011-10-01 MED ORDER — TRAZODONE HCL 100 MG PO TABS: ORAL_TABLET | ORAL | Status: DC

## 2011-10-01 NOTE — Telephone Encounter (Signed)
Faxed refill request from [facility], last filled 08/29/11 for # [phone].

## 2011-10-03 ENCOUNTER — Ambulatory Visit: Payer: Self-pay | Admitting: Internal Medicine

## 2011-10-03 DIAGNOSIS — D509 Iron deficiency anemia, unspecified: Secondary | ICD-10-CM | POA: Diagnosis not present

## 2011-10-03 DIAGNOSIS — Z79899 Other long term (current) drug therapy: Secondary | ICD-10-CM | POA: Diagnosis not present

## 2011-10-08 ENCOUNTER — Ambulatory Visit: Payer: Medicare Other | Admitting: Pulmonary Disease

## 2011-10-10 DIAGNOSIS — Z794 Long term (current) use of insulin: Secondary | ICD-10-CM | POA: Diagnosis not present

## 2011-10-10 DIAGNOSIS — E039 Hypothyroidism, unspecified: Secondary | ICD-10-CM | POA: Diagnosis not present

## 2011-10-10 DIAGNOSIS — R609 Edema, unspecified: Secondary | ICD-10-CM | POA: Diagnosis not present

## 2011-10-16 LAB — PATHOLOGY REPORT

## 2011-10-17 DIAGNOSIS — J301 Allergic rhinitis due to pollen: Secondary | ICD-10-CM | POA: Diagnosis not present

## 2011-10-22 DIAGNOSIS — J301 Allergic rhinitis due to pollen: Secondary | ICD-10-CM | POA: Diagnosis not present

## 2011-10-24 ENCOUNTER — Institutional Professional Consult (permissible substitution): Payer: Medicare Other | Admitting: Cardiovascular Disease

## 2011-10-31 ENCOUNTER — Other Ambulatory Visit: Payer: Self-pay | Admitting: *Deleted

## 2011-10-31 MED ORDER — TRAZODONE HCL 100 MG PO TABS: ORAL_TABLET | ORAL | Status: DC

## 2011-10-31 NOTE — Telephone Encounter (Signed)
Faxed request from [facility], last filled 10/01/11 for # [phone].

## 2011-11-03 ENCOUNTER — Ambulatory Visit: Payer: Self-pay | Admitting: Internal Medicine

## 2011-11-03 DIAGNOSIS — D509 Iron deficiency anemia, unspecified: Secondary | ICD-10-CM | POA: Diagnosis not present

## 2011-11-03 DIAGNOSIS — Z79899 Other long term (current) drug therapy: Secondary | ICD-10-CM | POA: Diagnosis not present

## 2011-11-12 ENCOUNTER — Encounter: Payer: Self-pay | Admitting: Pulmonary Disease

## 2011-11-12 ENCOUNTER — Ambulatory Visit (INDEPENDENT_AMBULATORY_CARE_PROVIDER_SITE_OTHER): Payer: Medicare Other | Admitting: Pulmonary Disease

## 2011-11-12 VITALS — BP 126/72 | HR 73 | Temp 98.1°F | Ht 66.0 in | Wt 190.0 lb

## 2011-11-12 DIAGNOSIS — J45909 Unspecified asthma, uncomplicated: Secondary | ICD-10-CM

## 2011-11-12 DIAGNOSIS — R0602 Shortness of breath: Secondary | ICD-10-CM | POA: Diagnosis not present

## 2011-11-12 NOTE — Assessment & Plan Note (Signed)
Her dyspnea is multifactorial but certainly there is a large component of deconditioning.  She needs to start exercising regularly.  Plan: -encouraged 5 minutes of walking daily, to increase by a few minute each weak; goal 25 minutes daily -encouraged weight loss

## 2011-11-12 NOTE — Progress Notes (Signed)
Subjective:    Patient ID: [name], female    DOB: [birth year], 66 y.o.   MRN: [id number]  HPI This is a 66 y/o female who was previously evaluated by Dr. Delford Field in [place] for shortness of breath on exertion.  She hada  Normal childhood without significant respiratory problems, however for the last several years she has had multiple bouts of bronchitis.  She was hospitalized in December 2012 for 3-4 days with bronchitis and was discharged with prednisone.  She states that she has been treated with prednisone for bronchitis for many years at least once per year.  She has been evaluated by Dr. Mariah Milling who found no evidence for ongoing coronary artery disease needing further work up.  She notes that she has been short of breath for years with exertion of minimal distances (less than 100 yards).  She has allergic rhinitis and she has been taking allergy shots twice a week for years. She has intermittent wheezing and chest tightness.  Dr. Lynelle Doctor notes that she was intolerant of symbicort in the past, but she is unsure why. She was instructed to take spiriva on her last visit with Dr. Delford Field but she has not been doing this.  She says that her breathing is doing better since her December hospitalization.     Former smoker: smoked 1ppd for 30-40 years   Past Medical History  Diagnosis Date  . Allergic rhinitis   . Diabetes mellitus     type 2  . Diverticulitis of colon   . GERD (gastroesophageal reflux disease)   . Chronic depression   . Hypercholesterolemia     intolerance of statins and niaspan  . Apnea, sleep     mild, intolerant of cpap  . Coronary artery disease   . ACE-inhibitor cough   . Cataract   . Chronic headache      Family History  Problem Relation Age of Onset  . Breast cancer Mother     breast  . Hypertension Father   . Cancer Father     mesothelioma  . Asthma Father   . Stroke Paternal Grandfather   . Heart disease       History   Social History  . Marital  Status: Married    Spouse Name: N/A    Number of Children: 3  . Years of Education: N/A   Occupational History  . Retired     Lawyer   Social History Main Topics  . Smoking status: Former Smoker -- 1.0 packs/day for 30 years    Types: Cigarettes    Quit date: 02/03/2008  . Smokeless tobacco: Never Used  . Alcohol Use: No  . Drug Use: Not on file  . Sexually Active: Not on file   Other Topics Concern  . Not on file   Social History Narrative  . No narrative on file     Allergies  Allergen Reactions  . Budesonide-Formoterol Fumarate     Shakiness, tremors  . Atorvastatin   . Bupropion Hcl   . Codeine Sulfate   . Gabapentin   . Latex   . Lisinopril     REACTION: cough  . Meperidine Hcl   . Metformin     REACTION: unspecified  . Mirtazapine   . Mometasone Furoate     REACTION: Nause and vomiting  . Morphine Sulfate     REACTION: Chest pain  . Olanzapine   . Other     Beta Blockers, reaction unknown but listed  .  Oxycodone-Acetaminophen   . Propoxyphene-Acetaminophen   . Rosuvastatin     REACTION: Makes patient weak.  . Shellfish Allergy   . Venlafaxine     REACTION: unspecified  . Zolpidem Tartrate     REACTION: Jittery, diarrhea     Outpatient Prescriptions Prior to Visit  Medication Sig Dispense Refill  . albuterol (PROVENTIL HFA;VENTOLIN HFA) 108 (90 BASE) MCG/ACT inhaler Inhale 2 puffs into the lungs every 6 (six) hours as needed for wheezing or shortness of breath. As needed for wheezing  1 Inhaler  6  . BD INSULIN SYRINGE ULTRAFINE 31G X 5/16" 0.3 ML MISC Use as directed      . Calcium-Vitamin D (CALTRATE 600 PLUS-VIT D PO) Take 1 tablet by mouth 2 (two) times daily.       Marland Kitchen FLUoxetine (PROZAC) 40 MG capsule Take 1 capsule (40 mg total) by mouth 2 (two) times daily.  180 capsule  3  . furosemide (LASIX) 20 MG tablet Take 2 tablets (40 mg total) by mouth daily.  30 tablet  11  . glucose blood test strip (OneTouch Ultra test strips) use to check blood  sugar four times daily  100 each  12  . insulin glargine (LANTUS SOLOSTAR) 100 UNIT/ML injection Inject 82 Units into the skin 2 (two) times daily.  14760 mL  3  . insulin lispro (HUMALOG) 100 UNIT/ML injection Inject 10 Units into the skin 3 (three) times daily before meals. Along with Sliding Scale      . IRON PO Take by mouth. Pt takes iron infusions every 6 weeks until September      . lansoprazole (PREVACID) 30 MG capsule TAKE 1 CAPSULE DAILY  90 capsule  3  . levothyroxine (SYNTHROID, LEVOTHROID) 150 MCG tablet Take 150 mcg by mouth daily.        Marland Kitchen loratadine (CLARITIN) 10 MG tablet Take 10 mg by mouth daily as needed.      . nitroGLYCERIN (NITROLINGUAL) 0.4 MG/SPRAY spray Place 1 spray under the tongue as needed. Chest pain       . potassium chloride (K-DUR) 10 MEQ tablet Take 2 tablets (20 mEq total) by mouth daily. Take with lasix only  60 tablet  6  . traZODone (DESYREL) 100 MG tablet Take three tablets by mouth at bedtime  90 tablet  3  . tiotropium (SPIRIVA HANDIHALER) 18 MCG inhalation capsule Place 1 capsule (18 mcg total) into inhaler and inhale daily.  30 capsule  12       Review of Systems  Constitutional: Negative for fever, chills and unexpected weight change.  HENT: Negative for ear pain, nosebleeds, congestion, sore throat, rhinorrhea, sneezing, trouble swallowing, dental problem, voice change, postnasal drip and sinus pressure.   Eyes: Negative for visual disturbance.  Respiratory: Positive for cough and shortness of breath. Negative for choking.   Cardiovascular: Positive for leg swelling. Negative for chest pain.  Gastrointestinal: Negative for vomiting, abdominal pain and diarrhea.  Genitourinary: Negative for difficulty urinating.  Musculoskeletal: Negative for arthralgias.  Skin: Negative for rash.  Neurological: Negative for tremors, syncope and headaches.  Hematological: Does not bruise/bleed easily.       Objective:   Physical Exam  Filed Vitals:    11/12/11 1340  BP: 126/72  Pulse: 73  Temp: 98.1 F (36.7 C)  TempSrc: Oral  Height: 5\' 6"  (1.676 m)  Weight: 190 lb (86.183 kg)  SpO2: 97%   Gen: well appearing, no acute distress HEENT: NCAT, PERRL, EOMi, OP clear, neck supple  without masses PULM: CTA B CV: RRR, no mgr, no JVD AB: BS+, soft, nontender, no hsm Ext: warm, trace edema in ankles, no clubbing, no cyanosis Derm: no rash or skin breakdown Neuro: A&Ox4, CN II-XII intact, strength 5/5 in all 4 extremities  09/2011 normal spirometry  08/2011 CT chest: thoracic aorta and coronary calcifications; 1.3 mm LLL nodule?, no parenchymal abnormality     Assessment & Plan:

## 2011-11-12 NOTE — Patient Instructions (Signed)
Take the spiriva one puff every day no matter how you are feeling. Keep taking the albuterol as needed for shortness of breath. Start exercising regularly, just 5 minutes of walking a day is OK at first.  Gradually increase to a goal of 25 minutes of walking a day. We will see you back in 3 months or sooner if needed.

## 2011-11-12 NOTE — Assessment & Plan Note (Signed)
With normal spirometry and annual bronchitis, I think that she has chronic bronchitis with asthmatic features.  This is based on her history of intermittent chest tightness and wheezing with a prior history of atopy with a strong smoking history.  I think that it is reasonable to trial Spiriva given her failure with ICS and LABA's as there is some evidence for the use of Spiriva in asthma (typically for more advanced disease).  I explained to her that she should use the spiriva daily and not as needed as she is doing.  Overall she is much better, so it sounds as if she had a bad flare in late 2012 through early 2013 and it has taken her a while to recover.

## 2011-11-12 NOTE — Assessment & Plan Note (Addendum)
Moderate persistent asthma with normal spirometry Intolerant of inhaled steroids and LABA No CT evidence for interstitial lung disease Serology borderline pos ANA, RF but no clinical evidence for autoimmune disease IgE 64 with multiple positive environmental allergens.  Hypersensitivity panel neg  Today we discussed that even though her lung function is normal, her symptoms are consistent with moderate persistent asthma.  This is based on her history of intermittent chest tightness and wheezing with a prior history of atopy.  I think that it is reasonable to trial Spiriva given her failure with ICS and LABA's as there is some evidence for the use of Spiriva in asthma (typically for more advanced disease).  I explained to her that she should use the spiriva daily and not as needed as she is doing.  Overall she is much better, so it sounds as if she had a bad flare in late 2012 through early 2013 and it has taken her a while to recover.

## 2011-11-14 LAB — IRON AND TIBC
Iron Saturation: 16 %
Iron: 49 ug/dL — ABNORMAL LOW (ref 50–170)
Unbound Iron-Bind.Cap.: 252 ug/dL

## 2011-11-28 ENCOUNTER — Ambulatory Visit (INDEPENDENT_AMBULATORY_CARE_PROVIDER_SITE_OTHER): Payer: Medicare Other | Admitting: Family Medicine

## 2011-11-28 ENCOUNTER — Encounter: Payer: Self-pay | Admitting: Family Medicine

## 2011-11-28 VITALS — BP 138/70 | HR 72 | Temp 98.3°F | Wt 191.0 lb

## 2011-11-28 DIAGNOSIS — M549 Dorsalgia, unspecified: Secondary | ICD-10-CM | POA: Diagnosis not present

## 2011-11-28 MED ORDER — CYCLOBENZAPRINE HCL 5 MG PO TABS: 5.0000 mg | ORAL_TABLET | Freq: Three times a day (TID) | ORAL | Status: AC | PRN

## 2011-11-28 MED ORDER — LIDOCAINE 5 % EX PTCH: 1.0000 | MEDICATED_PATCH | CUTANEOUS | Status: AC
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
## 2011-11-28 NOTE — Patient Instructions (Addendum)
Good to see you. Please continue using your heating pad. Use your lidoderm patch and flexeril as needed.

## 2011-12-03 ENCOUNTER — Ambulatory Visit: Payer: Self-pay | Admitting: Internal Medicine

## 2011-12-03 DIAGNOSIS — Z79899 Other long term (current) drug therapy: Secondary | ICD-10-CM | POA: Diagnosis not present

## 2011-12-03 DIAGNOSIS — D509 Iron deficiency anemia, unspecified: Secondary | ICD-10-CM | POA: Diagnosis not present

## 2011-12-07 DIAGNOSIS — L219 Seborrheic dermatitis, unspecified: Secondary | ICD-10-CM | POA: Diagnosis not present

## 2011-12-07 DIAGNOSIS — L570 Actinic keratosis: Secondary | ICD-10-CM | POA: Diagnosis not present

## 2011-12-21 ENCOUNTER — Other Ambulatory Visit: Payer: Self-pay | Admitting: *Deleted

## 2011-12-21 MED ORDER — INSULIN PEN NEEDLE 32G X 6 MM MISC: Status: DC

## 2012-01-03 ENCOUNTER — Ambulatory Visit: Payer: Self-pay | Admitting: Internal Medicine

## 2012-01-04 DIAGNOSIS — E039 Hypothyroidism, unspecified: Secondary | ICD-10-CM | POA: Diagnosis not present

## 2012-01-09 DIAGNOSIS — J301 Allergic rhinitis due to pollen: Secondary | ICD-10-CM | POA: Diagnosis not present

## 2012-01-11 DIAGNOSIS — E669 Obesity, unspecified: Secondary | ICD-10-CM | POA: Diagnosis not present

## 2012-01-11 DIAGNOSIS — E890 Postprocedural hypothyroidism: Secondary | ICD-10-CM | POA: Diagnosis not present

## 2012-01-17 DIAGNOSIS — J301 Allergic rhinitis due to pollen: Secondary | ICD-10-CM | POA: Diagnosis not present

## 2012-01-21 ENCOUNTER — Other Ambulatory Visit: Payer: Self-pay | Admitting: *Deleted

## 2012-01-21 MED ORDER — GLUCOSE BLOOD VI STRP: ORAL_STRIP | Status: DC

## 2012-02-06 ENCOUNTER — Ambulatory Visit: Payer: Self-pay | Admitting: Internal Medicine

## 2012-02-06 DIAGNOSIS — F329 Major depressive disorder, single episode, unspecified: Secondary | ICD-10-CM | POA: Diagnosis not present

## 2012-02-06 DIAGNOSIS — I2510 Atherosclerotic heart disease of native coronary artery without angina pectoris: Secondary | ICD-10-CM | POA: Diagnosis not present

## 2012-02-06 DIAGNOSIS — Z79899 Other long term (current) drug therapy: Secondary | ICD-10-CM | POA: Diagnosis not present

## 2012-02-06 DIAGNOSIS — E785 Hyperlipidemia, unspecified: Secondary | ICD-10-CM | POA: Diagnosis not present

## 2012-02-06 DIAGNOSIS — E039 Hypothyroidism, unspecified: Secondary | ICD-10-CM | POA: Diagnosis not present

## 2012-02-06 DIAGNOSIS — D509 Iron deficiency anemia, unspecified: Secondary | ICD-10-CM | POA: Diagnosis not present

## 2012-02-06 DIAGNOSIS — I10 Essential (primary) hypertension: Secondary | ICD-10-CM | POA: Diagnosis not present

## 2012-02-06 DIAGNOSIS — Z794 Long term (current) use of insulin: Secondary | ICD-10-CM | POA: Diagnosis not present

## 2012-02-06 DIAGNOSIS — E119 Type 2 diabetes mellitus without complications: Secondary | ICD-10-CM | POA: Diagnosis not present

## 2012-02-06 DIAGNOSIS — G4733 Obstructive sleep apnea (adult) (pediatric): Secondary | ICD-10-CM | POA: Diagnosis not present

## 2012-02-06 DIAGNOSIS — K219 Gastro-esophageal reflux disease without esophagitis: Secondary | ICD-10-CM | POA: Diagnosis not present

## 2012-02-06 LAB — IRON AND TIBC
Iron Bind.Cap.(Total): 276 ug/dL (ref 250–450)
Iron Saturation: 22 %
Iron: 62 ug/dL (ref 50–170)
Unbound Iron-Bind.Cap.: 214 ug/dL

## 2012-02-06 LAB — CBC CANCER CENTER
Basophil #: 0.1 x10 3/mm (ref 0.0–0.1)
Basophil %: 0.7 %
Eosinophil #: 0.2 x10 3/mm (ref 0.0–0.7)
HCT: 37 % (ref 35.0–47.0)
HGB: 12.1 g/dL (ref 12.0–16.0)
MCH: 28.2 pg (ref 26.0–34.0)
MCV: 86 fL (ref 80–100)
Monocyte #: 0.5 x10 3/mm (ref 0.2–0.9)
Monocyte %: 6.7 %
Neutrophil %: 66.5 %
Platelet: 158 x10 3/mm (ref 150–440)
RBC: 4.29 10*6/uL (ref 3.80–5.20)
WBC: 7.7 x10 3/mm (ref 3.6–11.0)

## 2012-02-06 LAB — FERRITIN: Ferritin (ARMC): 455 ng/mL — ABNORMAL HIGH (ref 8–388)

## 2012-02-17 ENCOUNTER — Other Ambulatory Visit: Payer: Self-pay | Admitting: Family Medicine

## 2012-02-19 DIAGNOSIS — L259 Unspecified contact dermatitis, unspecified cause: Secondary | ICD-10-CM | POA: Diagnosis not present

## 2012-02-19 DIAGNOSIS — L280 Lichen simplex chronicus: Secondary | ICD-10-CM | POA: Diagnosis not present

## 2012-02-20 ENCOUNTER — Encounter: Payer: Self-pay | Admitting: Cardiovascular Disease

## 2012-02-20 ENCOUNTER — Ambulatory Visit (INDEPENDENT_AMBULATORY_CARE_PROVIDER_SITE_OTHER): Payer: Medicare Other | Admitting: Cardiovascular Disease

## 2012-02-20 VITALS — BP 136/70 | HR 59 | Ht 66.0 in | Wt 192.1 lb

## 2012-02-20 DIAGNOSIS — F329 Major depressive disorder, single episode, unspecified: Secondary | ICD-10-CM

## 2012-02-20 DIAGNOSIS — R079 Chest pain, unspecified: Secondary | ICD-10-CM | POA: Diagnosis not present

## 2012-02-20 DIAGNOSIS — I2510 Atherosclerotic heart disease of native coronary artery without angina pectoris: Secondary | ICD-10-CM | POA: Diagnosis not present

## 2012-02-20 DIAGNOSIS — I10 Essential (primary) hypertension: Secondary | ICD-10-CM

## 2012-02-20 DIAGNOSIS — E119 Type 2 diabetes mellitus without complications: Secondary | ICD-10-CM

## 2012-02-20 DIAGNOSIS — E785 Hyperlipidemia, unspecified: Secondary | ICD-10-CM

## 2012-02-20 DIAGNOSIS — E7800 Pure hypercholesterolemia, unspecified: Secondary | ICD-10-CM

## 2012-02-20 DIAGNOSIS — R0602 Shortness of breath: Secondary | ICD-10-CM

## 2012-02-20 NOTE — Assessment & Plan Note (Signed)
Depression seems to be a part of her issues. Not motivated to exercise, eating what she wants. She reports that she has given up in terms of her health issues.

## 2012-02-20 NOTE — Progress Notes (Signed)
Patient ID: [name], female    DOB: May 10, 1946, 66 y.o.   MRN: [id number]  HPI Comments: [name] is a 66 year old woman with a history of hypertension, hyperlipidemia, diabetes, COPD, chronic cough and coronary artery disease, status post two-vessel angioplasty with Promus drug-eluting stents in May 2009, chronic CP and SOB.     She was Admitted to [facility] 12/20- 05/28/2011 for acute respiratory failure due to bronchitis and COPD exacerbation. Also with anemia Initial CXR- showed  Pulmonary interstitial prominence. 2 D echo- Normal EF , essentially normal., Placed on Levaquin, prednisone and short course of diuresis with IV lasix. Symptoms resolved.   Hbg 8.5.  Due to CAD, transfused 1 Unit PRBCs.  DM- a1c 8.4.  Recent phone call to our office for edema and shortness of breath, sent to the emergency room for further evaluation. She was given diuresis and steroids and reports mild improvement of her shortness of breath. BNP was low at 120,  chest x-ray showed interstitial prominence. She reports last hemoglobin was 11.4. She is receiving iron infusions. ER physician and radiologist did not feel symptoms were secondary to CHF and heart failure and she was discharged home with prednisone and Lasix.  She reports that she is unmotivated, eats what she likes. Her weight is going up and sugars are not well-controlled. She has tried Lipitor and Crestor in the past and this caused leg pain and she's not on a statin.. She does have occasional chest pain on the left lasting from several seconds to up to 15 minutes at a time. Symptoms are mild,, at rest. Not associated with exertion though she is concerned about her heart. She does not do any regular exercise as she is not motivated She continues to have shortness of breath with exertion sometimes, not always  Myoview 01/2009 EF 72% normal perfusion EKG shows normal sinus rhythm with rate 61 beats per minute with no significant ST or T wave  changes         Outpatient Encounter Prescriptions as of 02/20/2012  Medication Sig Dispense Refill  . albuterol (PROVENTIL HFA;VENTOLIN HFA) 108 (90 BASE) MCG/ACT inhaler Inhale 2 puffs into the lungs every 6 (six) hours as needed for wheezing or shortness of breath. As needed for wheezing  1 Inhaler  6  . Calcium-Vitamin D (CALTRATE 600 PLUS-VIT D PO) Take 1 tablet by mouth 2 (two) times daily.       Marland Kitchen FLUoxetine (PROZAC) 40 MG capsule TAKE 1 CAPSULE TWICE DAILY  180 capsule  1  . furosemide (LASIX) 20 MG tablet Take 40 mg by mouth daily as needed.      Marland Kitchen glucose blood test strip (OneTouch Ultra test strips) use to check blood sugar four times daily  100 each  11  . insulin glargine (LANTUS) 100 UNIT/ML injection Inject 90 Units into the skin every morning.      . insulin lispro (HUMALOG) 100 UNIT/ML injection Take 10 units in the am, 15 units at noon & 15 units in the pm.      . Insulin Pen Needle 32G X 6 MM MISC Use twice daily with byetta  100 each  5  . IRON PO Take by mouth. Pt takes iron infusions every 6 weeks until September      . lansoprazole (PREVACID) 30 MG capsule TAKE 1 CAPSULE DAILY  90 capsule  3  . levothyroxine (SYNTHROID, LEVOTHROID) 150 MCG tablet Take 150 mcg by mouth daily.        Marland Kitchen  loratadine (CLARITIN) 10 MG tablet Take 10 mg by mouth daily as needed.      . nitroGLYCERIN (NITROLINGUAL) 0.4 MG/SPRAY spray Place 1 spray under the tongue as needed. Chest pain       . potassium chloride (K-DUR) 10 MEQ tablet Take 20 mEq by mouth daily as needed.       Review of Systems  Constitutional: Positive for fatigue.  HENT: Negative.   Eyes: Negative.   Respiratory: Positive for shortness of breath.   Cardiovascular: Positive for leg swelling.  Gastrointestinal: Negative.   Musculoskeletal: Negative.   Skin: Negative.   Neurological: Negative.   Hematological: Negative.   Psychiatric/Behavioral: Negative.   All other systems reviewed and are negative.  BP 136/70   Pulse 59  Ht 5\' 6"  (1.676 m)  Wt 192 lb 2 oz (87.147 kg)  BMI 31.01 kg/m2  Physical Exam  Nursing note and vitals reviewed. Constitutional: She is oriented to person, place, and time. She appears well-developed and well-nourished.  HENT:  Head: Normocephalic.  Nose: Nose normal.  Mouth/Throat: Oropharynx is clear and moist.  Eyes: Conjunctivae normal are normal. Pupils are equal, round, and reactive to light.  Neck: Normal range of motion. Neck supple. No JVD present.  Cardiovascular: Normal rate, regular rhythm, S1 normal, S2 normal, normal heart sounds and intact distal pulses.  Exam reveals no gallop and no friction rub.   No murmur heard. Pulmonary/Chest: Effort normal and breath sounds normal. No respiratory distress. She has no wheezes. She has no rales. She exhibits no tenderness.  Abdominal: Soft. Bowel sounds are normal. She exhibits no distension. There is no tenderness.  Musculoskeletal: Normal range of motion. She exhibits no edema and no tenderness.  Lymphadenopathy:    She has no cervical adenopathy.  Neurological: She is alert and oriented to person, place, and time. Coordination normal.  Skin: Skin is warm and dry. No rash noted. No erythema.  Psychiatric: She has a normal mood and affect. Her behavior is normal. Judgment and thought content normal.         Assessment and Plan

## 2012-02-20 NOTE — Patient Instructions (Addendum)
You are doing well. No medication changes were made.  Please come in for blood work this week or next week for cholesterol  Please call us if you have new issues that need to be addressed before your next appt.  Your physician wants you to follow-up in: 6 months.  You will receive a reminder letter in the mail two months in advance. If you don't receive a letter, please call our office to schedule the follow-up appointment.

## 2012-02-20 NOTE — Assessment & Plan Note (Signed)
Atypical type chest pain. We have suggested she call our office if symptoms come on more frequently or more intense, particularly with exertion. She does not want any workup right now, especially a stress test

## 2012-02-20 NOTE — Assessment & Plan Note (Signed)
Followed by endocrine, Dr. Alesia Morin. She reports her sugars are high and she does not have a strict diet.

## 2012-02-20 NOTE — Assessment & Plan Note (Signed)
We will recheck her cholesterol at her convenience this week. I suspect we will need to try low-dose simvastatin or a statin. Previous problem on Lipitor and Crestor.

## 2012-02-20 NOTE — Assessment & Plan Note (Signed)
She does not have COPD by spirometry. Likely conditioning. We have suggested she start a regular exercise program. Symptoms get worse, we would do ischemia workup.

## 2012-02-20 NOTE — Assessment & Plan Note (Signed)
Blood pressure is well controlled on today's visit. No changes made to the medications. 

## 2012-02-22 ENCOUNTER — Ambulatory Visit: Payer: Medicare Other

## 2012-02-22 DIAGNOSIS — I2510 Atherosclerotic heart disease of native coronary artery without angina pectoris: Secondary | ICD-10-CM | POA: Diagnosis not present

## 2012-02-22 DIAGNOSIS — E785 Hyperlipidemia, unspecified: Secondary | ICD-10-CM

## 2012-02-23 DIAGNOSIS — Z23 Encounter for immunization: Secondary | ICD-10-CM | POA: Diagnosis not present

## 2012-03-04 ENCOUNTER — Ambulatory Visit: Payer: Self-pay | Admitting: Internal Medicine

## 2012-03-07 ENCOUNTER — Encounter: Payer: Self-pay | Admitting: Family Medicine

## 2012-03-07 ENCOUNTER — Ambulatory Visit (INDEPENDENT_AMBULATORY_CARE_PROVIDER_SITE_OTHER): Payer: Medicare Other | Admitting: Family Medicine

## 2012-03-07 VITALS — BP 123/84 | HR 76 | Temp 97.9°F | Wt 189.0 lb

## 2012-03-07 DIAGNOSIS — R413 Other amnesia: Secondary | ICD-10-CM | POA: Diagnosis not present

## 2012-03-07 DIAGNOSIS — F329 Major depressive disorder, single episode, unspecified: Secondary | ICD-10-CM | POA: Diagnosis not present

## 2012-03-07 MED ORDER — CITALOPRAM HYDROBROMIDE 20 MG PO TABS: 20.0000 mg | ORAL_TABLET | Freq: Every day | ORAL | Status: DC

## 2012-03-07 NOTE — Patient Instructions (Addendum)
Let's wean off your prozac: 1 tablet daily for 1 week, then 1 tablet every other day for 2 weeks then stop.  Go ahead and star the Celexa 20 mg daily.  Please call me in 3 weeks with an update.  Hang in there.

## 2012-03-07 NOTE — Progress Notes (Signed)
Patient ID: [name], female    DOB: 08-21-1945, 66 y.o.   MRN: [id number]  HPI Comments: [name] is a 66 year old woman with a history of hypertension, hyperlipidemia, diabetes, COPD, chronic cough and CAD here for ?memory problems.   She has complained about this for years and seems to get worse when her mood gets worse.  Lately she feels she is more forgetful.  She is getting in fights with her husband because she insists she is right about something when she is not. The only example she could give me was the name of an actor in a movie they were watching.  Has never forgotten when she parked her car, where she lived or names of family members.  Admits to being unmotivated.  She is now followed by endocrinology because her blood sugars were highly fluctuant.  She admits to have "given up on her health." She tends to eat what she wants and not watching how much weight she gains.  Admits to feeling depressed. She is not tearful, just "grouchy."  Denies manic episodes.  Sleeping ok.  Appetite is "too good." Denies any SI or HI.  Has been on Prozac 40 mg twice daily for years. Multiple antidepressant intolerances according to pt and in her chart including Wellbutrin, Effexor, Seroquel, Remeron, Zoloft.  She states all of these made her feel worse.  Has been to a psychiatrist and does not want to go back.     Patient Active Problem List  Diagnosis  . GOITER  . UNSPECIFIED HYPOTHYROIDISM  . DIABETES MELLITUS, TYPE II  . HYPERCHOLESTEROLEMIA  . DEPRESSION, CHRONIC  . OBSTRUCTIVE SLEEP APNEA  . HYPERTENSION, BENIGN  . CORONARY ARTERY DISEASE  . ALLERGIC RHINITIS  . PULMONARY NODULE, SOLITARY  . GERD  . DIVERTICULOSIS, COLON  . MICROSCOPIC HEMATURIA  . OSTEOARTHRITIS, CARPOMETACARPAL JOINT, RIGHT THUMB  . FASCIITIS, PLANTAR  . MUSCLE CRAMPS, FOOT  . FATIGUE  . MEMORY LOSS  . DYSURIA  . MAMMOGRAM, ABNORMAL, LEFT  . Edema  . Lower extremity edema  . Anemia  . Shortness  of breath  . Extrinsic asthma   Past Medical History  Diagnosis Date  . Allergic rhinitis   . Diabetes mellitus     type 2  . Diverticulitis of colon   . GERD (gastroesophageal reflux disease)   . Chronic depression   . Hypercholesterolemia     intolerance of statins and niaspan  . Apnea, sleep     mild, intolerant of cpap  . Coronary artery disease   . ACE-inhibitor cough   . Cataract   . Chronic headache   . Asthma, acute    Past Surgical History  Procedure Date  . Cholecystectomy 2001  . Colectomy     lap sigmoid  . Tonsillectomy   . Bartholin gland cyst excision   . Tubal ligation   . Bladder suspension   . Breast cyst excision 1988    bilateral nonmalignant tumors, x3  . Vaginal delivery     3  . Thyroidectomy   . Abdominal hysterectomy w/ partial vaginactomy   . Appendectomy 1964  . Cardiac catheterization   . Carotid stent    History  Substance Use Topics  . Smoking status: Former Smoker -- 1.0 packs/day for 30 years    Types: Cigarettes    Quit date: 02/03/2008  . Smokeless tobacco: Never Used  . Alcohol Use: No   Family History  Problem Relation Age of Onset  . Breast cancer  Mother     breast  . Hypertension Father   . Cancer Father     mesothelioma  . Asthma Father   . Stroke Paternal Grandfather   . Heart disease     Allergies  Allergen Reactions  . Budesonide-Formoterol Fumarate     Shakiness, tremors  . Atorvastatin   . Bupropion Hcl   . Codeine Sulfate   . Gabapentin   . Latex   . Lisinopril     REACTION: cough  . Meperidine Hcl   . Metformin     REACTION: unspecified  . Mirtazapine   . Mometasone Furoate     REACTION: Nause and vomiting  . Morphine Sulfate     REACTION: Chest pain  . Olanzapine   . Other     Beta Blockers, reaction unknown but listed  . Oxycodone-Acetaminophen   . Propoxyphene-Acetaminophen   . Rosuvastatin     REACTION: Makes patient weak.  . Shellfish Allergy   . Venlafaxine     REACTION:  unspecified  . Zolpidem Tartrate     REACTION: Jittery, diarrhea   Current Outpatient Prescriptions on File Prior to Visit  Medication Sig Dispense Refill  . albuterol (PROVENTIL HFA;VENTOLIN HFA) 108 (90 BASE) MCG/ACT inhaler Inhale 2 puffs into the lungs every 6 (six) hours as needed for wheezing or shortness of breath. As needed for wheezing  1 Inhaler  6  . Calcium-Vitamin D (CALTRATE 600 PLUS-VIT D PO) Take 1 tablet by mouth 2 (two) times daily.       Marland Kitchen FLUoxetine (PROZAC) 40 MG capsule TAKE 1 CAPSULE TWICE DAILY  180 capsule  1  . furosemide (LASIX) 20 MG tablet Take 40 mg by mouth daily as needed.      Marland Kitchen glucose blood test strip (OneTouch Ultra test strips) use to check blood sugar four times daily  100 each  11  . insulin glargine (LANTUS) 100 UNIT/ML injection Inject 90 Units into the skin every morning.      . insulin lispro (HUMALOG) 100 UNIT/ML injection Take 10 units in the am, 15 units at noon & 15 units in the pm.      . Insulin Pen Needle 32G X 6 MM MISC Use twice daily with byetta  100 each  5  . IRON PO Take by mouth. Pt takes iron infusions every 6 weeks until September      . lansoprazole (PREVACID) 30 MG capsule TAKE 1 CAPSULE DAILY  90 capsule  3  . levothyroxine (SYNTHROID, LEVOTHROID) 150 MCG tablet Take 150 mcg by mouth daily.        Marland Kitchen loratadine (CLARITIN) 10 MG tablet Take 10 mg by mouth daily as needed.      . nitroGLYCERIN (NITROLINGUAL) 0.4 MG/SPRAY spray Place 1 spray under the tongue as needed. Chest pain       . potassium chloride (K-DUR) 10 MEQ tablet Take 20 mEq by mouth daily as needed.       The PMH, PSH, Social History, Family History, Medications, and allergies have been reviewed in [facility], and have been updated if relevant.     Review of Systems  Constitutional: Positive for fatigue.  HENT: Negative.   Gastrointestinal: Negative.   Musculoskeletal: Negative.   Skin: Negative.   Neurological: Negative.   Hematological: Negative.     Psychiatric/Behavioral: Negative.   All other systems reviewed and are negative.   Physical Exam  BP 123/84  Pulse 76  Temp 97.9 F (36.6 C)  Wt 189 lb (  85.73 kg)  Constitutional: She is oriented to person, place, and time. She appears well-developed and well-nourished.  HENT:  Head: Normocephalic.  Nose: Nose normal.  Mouth/Throat: Oropharynx is clear and moist.  Eyes: Conjunctivae normal are normal. Pupils are equal, round, and reactive to light.  Neck: Normal range of motion. Neck supple. No JVD present.  Cardiovascular: Normal rate, regular rhythm, S1 normal, S2 normal, normal heart sounds and intact distal pulses.  Exam reveals no gallop and no friction rub.   No murmur heard. Pulmonary/Chest: Effort normal and breath sounds normal. No respiratory distress. She has no wheezes. She has no rales. She exhibits no tenderness.  Abdominal: Soft. Bowel sounds are normal. She exhibits no distension. There is no tenderness.  Musculoskeletal: Normal range of motion. She exhibits no edema and no tenderness.  Lymphadenopathy:    She has no cervical adenopathy.  Neurological: She is alert and oriented to person, place, and time. Coordination normal.  Skin: Skin is warm and dry. No rash noted. No erythema.  Psychiatric: flat affect (baseline). Not anxious appearing        Assessment and Plan 1. DEPRESSION, CHRONIC  >25 min spent with face to face with patient, >50% counseling and/or coordinating care Deteriorated. Will wean off of prozac and start Celexa 20 mg daily (see pt instructions).  Explained to Ms. Ramon Dredge that given her multiple medication intolerance, she will need to be referred back to psychiatry if this doesn't work. She also declines psychotherapy.  2. Memory loss  Deteriorated. MMSE 30/30- no indication of dementia. 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 See above.

## 2012-03-17 ENCOUNTER — Telehealth: Payer: Self-pay

## 2012-03-17 NOTE — Telephone Encounter (Signed)
Since she has been on so many medications and intolerant or allergic to so many, I strongly urge her to consider seeing a psychiatrist for medication management.  Would she be willing to do that?

## 2012-03-17 NOTE — Telephone Encounter (Signed)
Advised patient. She says that no, she is not willing to see psychiatrist- says she has seen two in the past and they didn't help her.  She says she will go back on her generic prozac.

## 2012-03-17 NOTE — Telephone Encounter (Signed)
Pt thinks Celexa causing her to have jitters and h/a. Pt stopped Celexa and went back on Fluoxetine 20 mg taking one tab twice a day..Jitters are better but still has jitters; blood sugars going up and down. Pt thinks part of problem is Celexa and part diabetes.[employer].Please advise.

## 2012-03-19 DIAGNOSIS — Z794 Long term (current) use of insulin: Secondary | ICD-10-CM | POA: Diagnosis not present

## 2012-03-21 ENCOUNTER — Encounter: Payer: Self-pay | Admitting: Family Medicine

## 2012-03-21 ENCOUNTER — Ambulatory Visit (INDEPENDENT_AMBULATORY_CARE_PROVIDER_SITE_OTHER): Payer: Medicare Other | Admitting: Family Medicine

## 2012-03-21 VITALS — BP 140/78 | HR 72 | Temp 97.8°F | Wt 189.0 lb

## 2012-03-21 DIAGNOSIS — F418 Other specified anxiety disorders: Secondary | ICD-10-CM | POA: Insufficient documentation

## 2012-03-21 DIAGNOSIS — F341 Dysthymic disorder: Secondary | ICD-10-CM

## 2012-03-21 NOTE — Progress Notes (Signed)
Patient ID: [name], female    DOB: 05-Apr-1946, 66 y.o.   MRN: [id number]  HPI Comments: [name] is a 66 year old woman with a history of hypertension, hyperlipidemia, diabetes, COPD, chronic cough and CAD here to discuss her medication.  Anxiety/depression- long term, chronic issue.  Intolerant of multiple antidepressants/anxiolytics-- see allergy list- she is unsure which are true allergies versus intolerances.  Most recently we tried to wean her off prozac and start celexa- she could not tolerate the celexa and started back on prozac.  Repeatedly refuses psychotherapy or referral to a psychiatrist.     Admits to being unmotivated.  She is now followed by endocrinology because her blood sugars were highly fluctuant.   Last saw Dr. Tedd Sias yesterday- note reviewed.  Since her finger sticks have been variable and [name] an easier regimen, Lantus and Novolgo were d/c'd and replaced with humalog 75/25. Dr. Tedd Sias also ave her an rx for ativan 0.5 mg q 8 hours prn.  [name] said that ativan made her too sleepy and gave her a headache- does not want to take it.  She tells me today she is no longer depressed or anxious.  Was upset about a bible verse- talked to her Sunday school teacher and feels better about it.  Still describes feeling "jittery" but thinks that is from her diabetes (Dr. Tedd Sias feels it is due to anxiety).  No SI or HI.    Patient Active Problem List  Diagnosis  . GOITER  . UNSPECIFIED HYPOTHYROIDISM  . DIABETES MELLITUS, TYPE II  . HYPERCHOLESTEROLEMIA  . OBSTRUCTIVE SLEEP APNEA  . HYPERTENSION, BENIGN  . CORONARY ARTERY DISEASE  . ALLERGIC RHINITIS  . PULMONARY NODULE, SOLITARY  . GERD  . DIVERTICULOSIS, COLON  . MICROSCOPIC HEMATURIA  . OSTEOARTHRITIS, CARPOMETACARPAL JOINT, RIGHT THUMB  . FASCIITIS, PLANTAR  . MUSCLE CRAMPS, FOOT  . FATIGUE  . MEMORY LOSS  . MAMMOGRAM, ABNORMAL, LEFT  . Lower extremity edema  . Anemia  . Shortness of  breath  . Extrinsic asthma  . Depression with anxiety   Past Medical History  Diagnosis Date  . Allergic rhinitis   . Diabetes mellitus     type 2  . Diverticulitis of colon   . GERD (gastroesophageal reflux disease)   . Chronic depression   . Hypercholesterolemia     intolerance of statins and niaspan  . Apnea, sleep     mild, intolerant of cpap  . Coronary artery disease   . ACE-inhibitor cough   . Cataract   . Chronic headache   . Asthma, acute    Past Surgical History  Procedure Date  . Cholecystectomy 2001  . Colectomy     lap sigmoid  . Tonsillectomy   . Bartholin gland cyst excision   . Tubal ligation   . Bladder suspension   . Breast cyst excision 1988    bilateral nonmalignant tumors, x3  . Vaginal delivery     3  . Thyroidectomy   . Abdominal hysterectomy w/ partial vaginactomy   . Appendectomy 1964  . Cardiac catheterization   . Carotid stent    History  Substance Use Topics  . Smoking status: Former Smoker -- 1.0 packs/day for 30 years    Types: Cigarettes    Quit date: 02/03/2008  . Smokeless tobacco: Never Used  . Alcohol Use: No   Family History  Problem Relation Age of Onset  . Breast cancer Mother     breast  .  Hypertension Father   . Cancer Father     mesothelioma  . Asthma Father   . Stroke Paternal Grandfather   . Heart disease     Allergies  Allergen Reactions  . Budesonide-Formoterol Fumarate     Shakiness, tremors  . Atorvastatin   . Bupropion Hcl   . Codeine Sulfate   . Gabapentin   . Latex   . Lisinopril     REACTION: cough  . Meperidine Hcl   . Metformin     REACTION: unspecified  . Mirtazapine   . Mometasone Furoate     REACTION: Nause and vomiting  . Morphine Sulfate     REACTION: Chest pain  . Olanzapine   . Other     Beta Blockers, reaction unknown but listed  . Oxycodone-Acetaminophen   . Propoxyphene-Acetaminophen   . Rosuvastatin     REACTION: Makes patient weak.  . Shellfish Allergy   .  Venlafaxine     REACTION: unspecified  . Zolpidem Tartrate     REACTION: Jittery, diarrhea   Current Outpatient Prescriptions on File Prior to Visit  Medication Sig Dispense Refill  . Calcium-Vitamin D (CALTRATE 600 PLUS-VIT D PO) Take 1 tablet by mouth 2 (two) times daily.       . citalopram (CELEXA) 20 MG tablet Take 1 tablet (20 mg total) by mouth daily.  30 tablet  2  . FLUoxetine (PROZAC) 40 MG capsule TAKE 1 CAPSULE TWICE DAILY  180 capsule  1  . furosemide (LASIX) 20 MG tablet Take 40 mg by mouth daily as needed.      Marland Kitchen glucose blood test strip (OneTouch Ultra test strips) use to check blood sugar four times daily  100 each  11  . insulin lispro (HUMALOG) 100 UNIT/ML injection Take 10 units in the am, 15 units at noon & 15 units in the pm.      . Insulin Pen Needle 32G X 6 MM MISC Use twice daily with byetta  100 each  5  . lansoprazole (PREVACID) 30 MG capsule TAKE 1 CAPSULE DAILY  90 capsule  3  . levothyroxine (SYNTHROID, LEVOTHROID) 150 MCG tablet Take 150 mcg by mouth daily.        Marland Kitchen loratadine (CLARITIN) 10 MG tablet Take 10 mg by mouth daily as needed.      . nitroGLYCERIN (NITROLINGUAL) 0.4 MG/SPRAY spray Place 1 spray under the tongue as needed. Chest pain       . potassium chloride (K-DUR) 10 MEQ tablet Take 20 mEq by mouth daily as needed.       The PMH, PSH, Social History, Family History, Medications, and allergies have been reviewed in [facility], and have been updated if relevant.     Review of Systems  See HPI  Physical Exam  BP 140/78  Pulse 72  Temp 97.8 F (36.6 C)  Wt 189 lb (85.73 kg)  Constitutional: She is oriented to person, place, and time. She appears well-developed and well-nourished.  Psychiatric: flat affect (baseline). Not anxious appearing        Assessment and Plan    1. Depression with anxiety    >15 min spent with face to face with patient, >50% counseling and/or coordinating care. Strongly urged her to speak to a psychologist or a  psychiatrist- she is refusing both. She is intolerant to so many medications- this is now outside my scope of practice which I explained to [name]. She states she feels less anxious and depressed now.

## 2012-04-04 DIAGNOSIS — J301 Allergic rhinitis due to pollen: Secondary | ICD-10-CM | POA: Diagnosis not present

## 2012-04-07 DIAGNOSIS — J301 Allergic rhinitis due to pollen: Secondary | ICD-10-CM | POA: Diagnosis not present

## 2012-04-10 ENCOUNTER — Other Ambulatory Visit: Payer: Self-pay

## 2012-04-10 MED ORDER — EZETIMIBE 10 MG PO TABS: 10.0000 mg | ORAL_TABLET | Freq: Every day | ORAL | Status: DC

## 2012-04-30 ENCOUNTER — Ambulatory Visit: Payer: Self-pay | Admitting: Internal Medicine

## 2012-04-30 DIAGNOSIS — D509 Iron deficiency anemia, unspecified: Secondary | ICD-10-CM | POA: Diagnosis not present

## 2012-04-30 LAB — IRON AND TIBC
Iron Bind.Cap.(Total): 266 ug/dL (ref 250–450)
Iron Saturation: 17 %
Iron: 45 ug/dL — ABNORMAL LOW (ref 50–170)
Unbound Iron-Bind.Cap.: 221 ug/dL

## 2012-04-30 LAB — CANCER CENTER HEMOGLOBIN: HGB: 12.3 g/dL (ref 12.0–16.0)

## 2012-05-04 ENCOUNTER — Ambulatory Visit: Payer: Self-pay | Admitting: Internal Medicine

## 2012-05-04 DIAGNOSIS — E785 Hyperlipidemia, unspecified: Secondary | ICD-10-CM | POA: Diagnosis not present

## 2012-05-04 DIAGNOSIS — I2510 Atherosclerotic heart disease of native coronary artery without angina pectoris: Secondary | ICD-10-CM | POA: Diagnosis not present

## 2012-05-04 DIAGNOSIS — I10 Essential (primary) hypertension: Secondary | ICD-10-CM | POA: Diagnosis not present

## 2012-05-04 DIAGNOSIS — F329 Major depressive disorder, single episode, unspecified: Secondary | ICD-10-CM | POA: Diagnosis not present

## 2012-05-04 DIAGNOSIS — Z79899 Other long term (current) drug therapy: Secondary | ICD-10-CM | POA: Diagnosis not present

## 2012-05-04 DIAGNOSIS — Z794 Long term (current) use of insulin: Secondary | ICD-10-CM | POA: Diagnosis not present

## 2012-05-04 DIAGNOSIS — K219 Gastro-esophageal reflux disease without esophagitis: Secondary | ICD-10-CM | POA: Diagnosis not present

## 2012-05-04 DIAGNOSIS — E119 Type 2 diabetes mellitus without complications: Secondary | ICD-10-CM | POA: Diagnosis not present

## 2012-05-04 DIAGNOSIS — E039 Hypothyroidism, unspecified: Secondary | ICD-10-CM | POA: Diagnosis not present

## 2012-05-04 DIAGNOSIS — D509 Iron deficiency anemia, unspecified: Secondary | ICD-10-CM | POA: Diagnosis not present

## 2012-05-07 DIAGNOSIS — D509 Iron deficiency anemia, unspecified: Secondary | ICD-10-CM | POA: Diagnosis not present

## 2012-05-07 DIAGNOSIS — Z79899 Other long term (current) drug therapy: Secondary | ICD-10-CM | POA: Diagnosis not present

## 2012-05-08 ENCOUNTER — Other Ambulatory Visit: Payer: Self-pay | Admitting: *Deleted

## 2012-05-08 MED ORDER — TRAZODONE HCL 100 MG PO TABS: ORAL_TABLET | ORAL | Status: DC

## 2012-05-08 NOTE — Telephone Encounter (Signed)
Ok to add to list and refill.

## 2012-05-08 NOTE — Telephone Encounter (Signed)
Medicine called to [facility]. 

## 2012-05-08 NOTE — Telephone Encounter (Signed)
Faxed refill request from [facility] [place] for trazodone 100 mg's.  This is not on med list.  Last filled 03/09/12.

## 2012-05-20 DIAGNOSIS — I10 Essential (primary) hypertension: Secondary | ICD-10-CM | POA: Diagnosis not present

## 2012-05-20 DIAGNOSIS — E890 Postprocedural hypothyroidism: Secondary | ICD-10-CM | POA: Diagnosis not present

## 2012-06-04 ENCOUNTER — Ambulatory Visit: Payer: Self-pay | Admitting: Internal Medicine

## 2012-06-04 HISTORY — PX: COLONOSCOPY: SHX174

## 2012-06-06 DIAGNOSIS — J301 Allergic rhinitis due to pollen: Secondary | ICD-10-CM | POA: Diagnosis not present

## 2012-06-06 DIAGNOSIS — H905 Unspecified sensorineural hearing loss: Secondary | ICD-10-CM | POA: Diagnosis not present

## 2012-06-06 DIAGNOSIS — H9319 Tinnitus, unspecified ear: Secondary | ICD-10-CM | POA: Diagnosis not present

## 2012-06-23 ENCOUNTER — Other Ambulatory Visit: Payer: Self-pay | Admitting: *Deleted

## 2012-06-23 MED ORDER — TRAZODONE HCL 100 MG PO TABS: ORAL_TABLET | ORAL | Status: DC

## 2012-06-23 NOTE — Telephone Encounter (Signed)
Faxed refill request from [facility], last filled 05/08/12.

## 2012-06-30 ENCOUNTER — Ambulatory Visit (INDEPENDENT_AMBULATORY_CARE_PROVIDER_SITE_OTHER): Payer: Medicare Other | Admitting: Family Medicine

## 2012-06-30 ENCOUNTER — Encounter: Payer: Self-pay | Admitting: Family Medicine

## 2012-06-30 VITALS — BP 120/60 | Temp 98.0°F | Ht 66.0 in | Wt 190.5 lb

## 2012-06-30 DIAGNOSIS — M19049 Primary osteoarthritis, unspecified hand: Secondary | ICD-10-CM | POA: Diagnosis not present

## 2012-06-30 DIAGNOSIS — M19039 Primary osteoarthritis, unspecified wrist: Secondary | ICD-10-CM | POA: Diagnosis not present

## 2012-06-30 DIAGNOSIS — M189 Osteoarthritis of first carpometacarpal joint, unspecified: Secondary | ICD-10-CM

## 2012-06-30 DIAGNOSIS — J301 Allergic rhinitis due to pollen: Secondary | ICD-10-CM | POA: Diagnosis not present

## 2012-06-30 MED ORDER — TRAMADOL HCL 50 MG PO TABS: 50.0000 mg | ORAL_TABLET | Freq: Four times a day (QID) | ORAL | Status: DC | PRN

## 2012-06-30 NOTE — Progress Notes (Signed)
Nature conservation officer at [facility] [address] [place] [address] Phone: [phone] Fax: [phone]  Date:  06/30/2012   Name:  [name]   DOB:  [birth year]   MRN:  [id number] Gender: female Age: 67 y.o.  Primary Physician:  Ruthe Mannan, MD  Evaluating MD: Hannah Beat, MD   Chief Complaint: thumb pain, Wrist Pain and Arm Pain   History of Present Illness:  [name] is a 67 y.o. pleasant patient who presents with the following:  L thumb and wrist has been hurting and has been getting worse. Over the last couple of weeks, taken some BC powder, Tylenol arthritis, which has not really helped. She will intermittently use tramadol, which will help for a while. I have injected her CMC joint before and it helped up to 1 year afterwards. Have done this twice. She has also tried linaments. Grip is much reduced and impared.  CMC and wrist OA  Patient Active Problem List  Diagnosis  . GOITER  . UNSPECIFIED HYPOTHYROIDISM  . DIABETES MELLITUS, TYPE II  . HYPERCHOLESTEROLEMIA  . OBSTRUCTIVE SLEEP APNEA  . HYPERTENSION, BENIGN  . CORONARY ARTERY DISEASE  . ALLERGIC RHINITIS  . PULMONARY NODULE, SOLITARY  . GERD  . DIVERTICULOSIS, COLON  . MICROSCOPIC HEMATURIA  . OSTEOARTHRITIS, CARPOMETACARPAL JOINT, RIGHT THUMB  . FASCIITIS, PLANTAR  . MUSCLE CRAMPS, FOOT  . FATIGUE  . MEMORY LOSS  . MAMMOGRAM, ABNORMAL, LEFT  . Lower extremity edema  . Anemia  . Shortness of breath  . Extrinsic asthma  . Depression with anxiety    Past Medical History  Diagnosis Date  . Allergic rhinitis   . Diabetes mellitus     type 2  . Diverticulitis of colon   . GERD (gastroesophageal reflux disease)   . Chronic depression   . Hypercholesterolemia     intolerance of statins and niaspan  . Apnea, sleep     mild, intolerant of cpap  . Coronary artery disease   . ACE-inhibitor cough   . Cataract   . Chronic headache   . Asthma, acute     Past Surgical History    Procedure Date  . Cholecystectomy 2001  . Colectomy     lap sigmoid  . Tonsillectomy   . Bartholin gland cyst excision   . Tubal ligation   . Bladder suspension   . Breast cyst excision 1988    bilateral nonmalignant tumors, x3  . Vaginal delivery     3  . Thyroidectomy   . Abdominal hysterectomy w/ partial vaginactomy   . Appendectomy 1964  . Cardiac catheterization   . Carotid stent     History  Substance Use Topics  . Smoking status: Former Smoker -- 1.0 packs/day for 30 years    Types: Cigarettes    Quit date: 02/03/2008  . Smokeless tobacco: Never Used  . Alcohol Use: No    Family History  Problem Relation Age of Onset  . Breast cancer Mother     breast  . Hypertension Father   . Cancer Father     mesothelioma  . Asthma Father   . Stroke Paternal Grandfather   . Heart disease      Allergies  Allergen Reactions  . Budesonide-Formoterol Fumarate     Shakiness, tremors  . Atorvastatin   . Bupropion Hcl   . Codeine Sulfate   . Gabapentin   . Latex   . Lisinopril     REACTION: cough  .  Meperidine Hcl   . Metformin     REACTION: unspecified  . Mirtazapine   . Mometasone Furoate     REACTION: Nause and vomiting  . Morphine Sulfate     REACTION: Chest pain  . Olanzapine   . Other     Beta Blockers, reaction unknown but listed  . Oxycodone-Acetaminophen   . Propoxyphene-Acetaminophen   . Rosuvastatin     REACTION: Makes patient weak.  . Shellfish Allergy   . Venlafaxine     REACTION: unspecified  . Zolpidem Tartrate     REACTION: Jittery, diarrhea    Medication list has been reviewed and updated.  Outpatient Prescriptions Prior to Visit  Medication Sig Dispense Refill  . Calcium-Vitamin D (CALTRATE 600 PLUS-VIT D PO) Take 1 tablet by mouth 2 (two) times daily.       Marland Kitchen FLUoxetine (PROZAC) 40 MG capsule TAKE 1 CAPSULE TWICE DAILY  180 capsule  1  . glucose blood test strip (OneTouch Ultra test strips) use to check blood sugar four times daily   100 each  11  . insulin lispro (HUMALOG) 100 UNIT/ML injection Take 10 units in the am, 15 units at noon & 15 units in the pm.      . levothyroxine (SYNTHROID, LEVOTHROID) 150 MCG tablet Take 150 mcg by mouth daily.        . nitroGLYCERIN (NITROLINGUAL) 0.4 MG/SPRAY spray Place 1 spray under the tongue as needed. Chest pain       . traZODone (DESYREL) 100 MG tablet Take three by mouth at bedtime.  90 tablet  0  . ezetimibe (ZETIA) 10 MG tablet Take 1 tablet (10 mg total) by mouth daily.  28 tablet  0  . furosemide (LASIX) 20 MG tablet Take 40 mg by mouth daily as needed.      . Insulin Pen Needle 32G X 6 MM MISC Use twice daily with byetta  100 each  5  . lansoprazole (PREVACID) 30 MG capsule TAKE 1 CAPSULE DAILY  90 capsule  3  . loratadine (CLARITIN) 10 MG tablet Take 10 mg by mouth daily as needed.      . potassium chloride (K-DUR) 10 MEQ tablet Take 20 mEq by mouth daily as needed.       Last reviewed on 06/30/2012  2:40 PM by Hannah Beat, MD  Review of Systems:   GEN: No fevers, chills. Nontoxic. Primarily MSK c/o today. MSK: Detailed in the HPI GI: tolerating PO intake without difficulty Neuro: No numbness, parasthesias, or tingling associated. Otherwise the pertinent positives of the ROS are noted above.    Physical Examination: BP 120/60  Temp 98 F (36.7 C) (Oral)  Ht 5\' 6"  (1.676 m)  Wt 190 lb 8 oz (86.41 kg)  BMI 30.75 kg/m2  Ideal Body Weight: Weight in (lb) to have BMI = 25: 154.6    GEN: WDWN, NAD, Non-toxic, Alert & Oriented x 3 HEENT: Atraumatic, Normocephalic.  Ears and Nose: No external deformity. EXTR: No clubbing/cyanosis/edema NEURO: Normal gait.  PSYCH: Normally interactive. Conversant. Not depressed or anxious appearing.  Calm demeanor.   B hand Ecchymosis or edema: neg ROM wrist/hand/digits: 60% loss of motion at wrist Carpals, MCP's, digits: diffuse heberden's nodes Distal Ulna and Radius: NT Ecchymosis or edema: neg No  instability Cysts/nodules: neg Digit triggering: neg Finkelstein's test: neg Snuffbox tenderness: neg Scaphoid tubercle: NT Resisted supination: NT Full composite fist, no malrotation Grip, all digits: 4/5 str, L is 4-/5 DIPJT: NT PIP JT: NT MCP  JT: mild TTP Axial load test: pain Phalen's: neg Tinel's: neg Atrophy: mild hypothenar and thenar atrophy  Hand sensation: intact   Assessment and Plan:  1. Wrist arthritis   2. Hand arthritis   3. CMC arthritis, thumb, degenerative    Prn tylenol, alleve bid for 2 weeks Intermittent ice  For now with flare, inject wrist and CMC joint  Injection, Intraarticular Wrist, LEFT Patient verbally consented for procedure; risks (including infection), benefits, and alternatives explained. Patient prepped with Chloraprep. Ethyl chloride used for anesthesia. Using sterile technique, just distal to Lister's tubercle, using 3 cc syringe with 22 gauge needle, needle inserted and aspirated, no blood. Then 1/2 cc Lidocaine 1% and 1/2 cc Depo-Medrol 40 mg (equivalent to Kenalog 20 mg) injected without difficulty into wrist.  Pressue applied, minimal blood. Tolerated well, decreased pain, no complications.  CMC joint injection, LEFT Verbal consent was obtained. Risks (including rare infection, pain), benefits, and alternatives were discussed. Prepped with Chloraprep and Ethyl Chloride used for anesthesia. Under sterile conditions, patient injected into 1st CMC joint at joint line in apex of snuffbox inserting perpendicularly with traction placed on thumb. Aspiration yields no blood. Decreased pain post injection. No complications. Needle size: 25 gauge Injection: 1/2 cc of Lidocaine 1% and 1/2 cc of Depo-Medrol 40 mg  Orders Today:  No orders of the defined types were placed in this encounter.    Updated Medication List: (Includes new medications, updates to list, dose adjustments) Meds ordered this encounter  Medications  . traMADol (ULTRAM) 50  MG tablet    Sig: Take 1 tablet (50 mg total) by mouth every 6 (six) hours as needed for pain.    Dispense:  50 tablet    Refill:  2    Medications Discontinued: There are no discontinued medications.   Signed, Elpidio Galea. Deland Slocumb, MD 06/30/2012 2:40 PM

## 2012-07-07 DIAGNOSIS — J301 Allergic rhinitis due to pollen: Secondary | ICD-10-CM | POA: Diagnosis not present

## 2012-07-10 ENCOUNTER — Ambulatory Visit: Payer: Self-pay | Admitting: General Surgery

## 2012-07-10 DIAGNOSIS — N6459 Other signs and symptoms in breast: Secondary | ICD-10-CM | POA: Diagnosis not present

## 2012-07-10 DIAGNOSIS — Z1231 Encounter for screening mammogram for malignant neoplasm of breast: Secondary | ICD-10-CM | POA: Diagnosis not present

## 2012-07-20 ENCOUNTER — Other Ambulatory Visit: Payer: Self-pay | Admitting: Family Medicine

## 2012-07-22 ENCOUNTER — Ambulatory Visit: Payer: Self-pay | Admitting: Internal Medicine

## 2012-07-22 DIAGNOSIS — D509 Iron deficiency anemia, unspecified: Secondary | ICD-10-CM | POA: Diagnosis not present

## 2012-07-22 DIAGNOSIS — Z79899 Other long term (current) drug therapy: Secondary | ICD-10-CM | POA: Diagnosis not present

## 2012-07-23 LAB — IRON AND TIBC
Iron Bind.Cap.(Total): 293 ug/dL (ref 250–450)
Iron Saturation: 18 %

## 2012-07-23 LAB — CANCER CENTER HEMOGLOBIN: HGB: 12.3 g/dL (ref 12.0–16.0)

## 2012-07-23 LAB — FERRITIN: Ferritin (ARMC): 312 ng/mL (ref 8–388)

## 2012-07-31 ENCOUNTER — Telehealth: Payer: Self-pay | Admitting: Family Medicine

## 2012-07-31 NOTE — Telephone Encounter (Signed)
Pt's husband called regarding omeprazole refill sent in on 07/20/12.  He states a 30 day supply was sent in but they usually get a 90 day supply.  He would like a printed RX for 90 day supply that he can pick up and mail to [facility].  Please call him when RX ready.

## 2012-07-31 NOTE — Telephone Encounter (Signed)
Ok to print out rx as pt requests.

## 2012-08-01 MED ORDER — LANSOPRAZOLE 30 MG PO CPDR: DELAYED_RELEASE_CAPSULE | ORAL | Status: DC

## 2012-08-01 NOTE — Telephone Encounter (Signed)
Medicine sent to [facility], ok to send electronically per patient's husband.

## 2012-08-02 ENCOUNTER — Ambulatory Visit: Payer: Self-pay | Admitting: Internal Medicine

## 2012-08-12 ENCOUNTER — Other Ambulatory Visit: Payer: Self-pay | Admitting: Family Medicine

## 2012-08-22 ENCOUNTER — Encounter: Payer: Self-pay | Admitting: Cardiovascular Disease

## 2012-08-22 ENCOUNTER — Ambulatory Visit (INDEPENDENT_AMBULATORY_CARE_PROVIDER_SITE_OTHER): Payer: Medicare Other | Admitting: Cardiovascular Disease

## 2012-08-22 VITALS — BP 120/58 | HR 58 | Ht 66.0 in | Wt 191.5 lb

## 2012-08-22 DIAGNOSIS — E7800 Pure hypercholesterolemia, unspecified: Secondary | ICD-10-CM

## 2012-08-22 DIAGNOSIS — E669 Obesity, unspecified: Secondary | ICD-10-CM | POA: Diagnosis not present

## 2012-08-22 DIAGNOSIS — E890 Postprocedural hypothyroidism: Secondary | ICD-10-CM | POA: Diagnosis not present

## 2012-08-22 DIAGNOSIS — I2510 Atherosclerotic heart disease of native coronary artery without angina pectoris: Secondary | ICD-10-CM

## 2012-08-22 DIAGNOSIS — R079 Chest pain, unspecified: Secondary | ICD-10-CM | POA: Diagnosis not present

## 2012-08-22 DIAGNOSIS — D649 Anemia, unspecified: Secondary | ICD-10-CM | POA: Diagnosis not present

## 2012-08-22 DIAGNOSIS — E119 Type 2 diabetes mellitus without complications: Secondary | ICD-10-CM

## 2012-08-22 DIAGNOSIS — I10 Essential (primary) hypertension: Secondary | ICD-10-CM

## 2012-08-22 DIAGNOSIS — Z794 Long term (current) use of insulin: Secondary | ICD-10-CM | POA: Diagnosis not present

## 2012-08-22 MED ORDER — NITROGLYCERIN 0.4 MG/SPRAY TL SOLN: 1.0000 | Status: DC | PRN

## 2012-08-22 NOTE — Progress Notes (Signed)
Patient ID: [name], female    DOB: 02-27-1946, 67 y.o.   MRN: [id number]  HPI Comments: [name] is a 67 year old woman with a history of hypertension, hyperlipidemia, diabetes, COPD, chronic cough and coronary artery disease, status post two-vessel angioplasty with Promus drug-eluting stents in May 2009, chronic CP and SOB.   Her biggest complaint today is the loss of her hearing, worse on the right despite wearing hearing aids She continues to receive iron infusions   Admitted to [facility] 12/20- 05/28/2011 for acute respiratory failure due to bronchitis and COPD exacerbation. anemia 2 D echo- Normal EF , essentially normal., Placed on Levaquin, prednisone and short course of diuresis with IV lasix.  Hbg 8.5.  Due to CAD, transfused 1 Unit PRBCs.  DM- a1c 8.4.  Previous symptoms of edema and shortness of breath, sent to the emergency room for further evaluation.  given diuresis and steroids and reports mild improvement of her shortness of breath. BNP was low at 120,  chest x-ray showed interstitial prominence. She reports last hemoglobin was 11.4.  she was discharged home with prednisone and Lasix.   Her weight is going up and sugars are not well-controlled.  She has tried Lipitor and Crestor in the past and this caused leg pain and she's not on a statin.. She does not do any regular exercise as she is not motivated She continues to have shortness of breath with exertion sometimes. No significant recent chest pain symptoms, she did have some several weeks ago but they have resolved, not associated with exertion.  Myoview 01/2009 EF 72% normal perfusion EKG shows normal sinus rhythm with rate 58 beats per minute with no significant ST or T wave changes        Outpatient Encounter Prescriptions as of 08/22/2012  Medication Sig Dispense Refill  . Calcium-Vitamin D (CALTRATE 600 PLUS-VIT D PO) Take 1 tablet by mouth 2 (two) times daily.       Marland Kitchen ezetimibe (ZETIA) 10 MG tablet Take 1  tablet (10 mg total) by mouth daily.  28 tablet  0  . FLUoxetine (PROZAC) 40 MG capsule TAKE 1 CAPSULE TWICE DAILY  180 capsule  1  . furosemide (LASIX) 20 MG tablet Take 40 mg by mouth daily as needed.      Marland Kitchen glucose blood test strip (OneTouch Ultra test strips) use to check blood sugar four times daily  100 each  11  . insulin aspart (NOVOLOG) 100 UNIT/ML injection Inject into the skin 3 (three) times daily before meals. As needed.      . insulin lispro (HUMALOG) 100 UNIT/ML injection Take 10 units in the am, 15 units at noon & 15 units in the pm.      . Insulin Pen Needle 32G X 6 MM MISC Use twice daily with byetta  100 each  5  . lansoprazole (PREVACID) 30 MG capsule Take one by mouth daily  90 capsule  1  . levothyroxine (SYNTHROID, LEVOTHROID) 150 MCG tablet Take 150 mcg by mouth daily.        Marland Kitchen loratadine (CLARITIN) 10 MG tablet Take 10 mg by mouth daily as needed.      . nitroGLYCERIN (NITROLINGUAL) 0.4 MG/SPRAY spray Place 1 spray under the tongue as needed. Chest pain  12 g  3  . potassium chloride (K-DUR) 10 MEQ tablet Take 20 mEq by mouth daily as needed.      . traMADol (ULTRAM) 50 MG tablet Take 1 tablet (50 mg total) by  mouth every 6 (six) hours as needed for pain.  50 tablet  2  . traZODone (DESYREL) 100 MG tablet TAKE THREE TABLETS BY MOUTH AT BEDTIME.  90 tablet  0   Review of Systems  Constitutional: Positive for fatigue.  HENT: Positive for hearing loss.   Eyes: Negative.   Respiratory: Positive for shortness of breath.   Gastrointestinal: Negative.   Musculoskeletal: Negative.   Skin: Negative.   Neurological: Negative.   Psychiatric/Behavioral: Negative.   All other systems reviewed and are negative.  BP 120/58  Pulse 58  Ht 5\' 6"  (1.676 m)  Wt 191 lb 8 oz (86.864 kg)  BMI 30.92 kg/m2  Physical Exam  Nursing note and vitals reviewed. Constitutional: She is oriented to person, place, and time. She appears well-developed and well-nourished.  HENT:  Head:  Normocephalic.  Nose: Nose normal.  Mouth/Throat: Oropharynx is clear and moist.  Eyes: Conjunctivae are normal. Pupils are equal, round, and reactive to light.  Neck: Normal range of motion. Neck supple. No JVD present.  Cardiovascular: Normal rate, regular rhythm, S1 normal, S2 normal, normal heart sounds and intact distal pulses.  Exam reveals no gallop and no friction rub.   No murmur heard. Pulmonary/Chest: Effort normal and breath sounds normal. No respiratory distress. She has no wheezes. She has no rales. She exhibits no tenderness.  Abdominal: Soft. Bowel sounds are normal. She exhibits no distension. There is no tenderness.  Musculoskeletal: Normal range of motion. She exhibits no edema and no tenderness.  Lymphadenopathy:    She has no cervical adenopathy.  Neurological: She is alert and oriented to person, place, and time. Coordination normal.  Skin: Skin is warm and dry. No rash noted. No erythema.  Psychiatric: She has a normal mood and affect. Her behavior is normal. Judgment and thought content normal.    Assessment and Plan

## 2012-08-22 NOTE — Patient Instructions (Addendum)
You are doing well. No medication changes were made.  Please call for any chest pain  Please call us if you have new issues that need to be addressed before your next appt.  Your physician wants you to follow-up in: 12 months.  You will receive a reminder letter in the mail two months in advance. If you don't receive a letter, please call our office to schedule the follow-up appointment.

## 2012-08-22 NOTE — Assessment & Plan Note (Signed)
We have encouraged continued exercise, careful diet management in an effort to lose weight. 

## 2012-08-22 NOTE — Assessment & Plan Note (Addendum)
Currently with no symptoms of angina. No further workup at this time. Continue current medication regimen.she does report having episodes of mild chest discomfort several weeks ago but these seemed to go away and were not associated with exertion. No recent chest pain symptoms.

## 2012-08-22 NOTE — Assessment & Plan Note (Signed)
Unable to tolerate statins in the past, did not discuss retrial of alternate statin on this visit

## 2012-08-22 NOTE — Assessment & Plan Note (Signed)
Blood pressure is well controlled on today's visit. No changes made to the medications. 

## 2012-08-22 NOTE — Assessment & Plan Note (Signed)
Followed by hematology, recent iron infusions

## 2012-08-27 ENCOUNTER — Other Ambulatory Visit: Payer: Self-pay

## 2012-08-27 MED ORDER — FLUOXETINE HCL 40 MG PO CAPS: ORAL_CAPSULE | ORAL | Status: DC

## 2012-08-27 NOTE — Telephone Encounter (Signed)
Pt's husband request refill fluoxetine to [facility]; notified done.

## 2012-09-03 ENCOUNTER — Ambulatory Visit: Payer: Self-pay | Admitting: Otolaryngology

## 2012-09-03 DIAGNOSIS — H9190 Unspecified hearing loss, unspecified ear: Secondary | ICD-10-CM | POA: Diagnosis not present

## 2012-09-03 DIAGNOSIS — R930 Abnormal findings on diagnostic imaging of skull and head, not elsewhere classified: Secondary | ICD-10-CM | POA: Diagnosis not present

## 2012-09-03 DIAGNOSIS — H903 Sensorineural hearing loss, bilateral: Secondary | ICD-10-CM | POA: Diagnosis not present

## 2012-09-03 LAB — CREATININE, SERUM: Creatinine: 0.74 mg/dL (ref 0.60–1.30)

## 2012-09-05 ENCOUNTER — Encounter: Payer: Self-pay | Admitting: Family Medicine

## 2012-09-05 ENCOUNTER — Ambulatory Visit (INDEPENDENT_AMBULATORY_CARE_PROVIDER_SITE_OTHER): Payer: Medicare Other | Admitting: Family Medicine

## 2012-09-05 VITALS — BP 154/60 | HR 64 | Temp 98.1°F | Wt 189.0 lb

## 2012-09-05 DIAGNOSIS — G459 Transient cerebral ischemic attack, unspecified: Secondary | ICD-10-CM

## 2012-09-05 DIAGNOSIS — R930 Abnormal findings on diagnostic imaging of skull and head, not elsewhere classified: Secondary | ICD-10-CM

## 2012-09-05 MED ORDER — ASPIRIN EC 81 MG PO TBEC: 81.0000 mg | DELAYED_RELEASE_TABLET | Freq: Every day | ORAL | Status: DC
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
## 2012-09-05 NOTE — Patient Instructions (Addendum)
Good to see you. Please stop by to see [name] on your way out to set up the ultrasound of your neck. We will call you with your results and I will let you know what Dr. Mariah Milling says.

## 2012-09-10 ENCOUNTER — Other Ambulatory Visit: Payer: Self-pay

## 2012-09-10 DIAGNOSIS — G459 Transient cerebral ischemic attack, unspecified: Secondary | ICD-10-CM

## 2012-09-17 ENCOUNTER — Other Ambulatory Visit: Payer: Self-pay | Admitting: Family Medicine

## 2012-09-24 DIAGNOSIS — J301 Allergic rhinitis due to pollen: Secondary | ICD-10-CM | POA: Diagnosis not present

## 2012-09-29 DIAGNOSIS — J301 Allergic rhinitis due to pollen: Secondary | ICD-10-CM | POA: Diagnosis not present

## 2012-09-30 ENCOUNTER — Other Ambulatory Visit (INDEPENDENT_AMBULATORY_CARE_PROVIDER_SITE_OTHER): Payer: Medicare Other

## 2012-09-30 ENCOUNTER — Other Ambulatory Visit: Payer: Self-pay

## 2012-09-30 DIAGNOSIS — G459 Transient cerebral ischemic attack, unspecified: Secondary | ICD-10-CM

## 2012-10-02 ENCOUNTER — Ambulatory Visit: Payer: Self-pay | Admitting: Internal Medicine

## 2012-10-02 DIAGNOSIS — I10 Essential (primary) hypertension: Secondary | ICD-10-CM | POA: Diagnosis not present

## 2012-10-02 DIAGNOSIS — D509 Iron deficiency anemia, unspecified: Secondary | ICD-10-CM | POA: Diagnosis not present

## 2012-10-02 DIAGNOSIS — Z794 Long term (current) use of insulin: Secondary | ICD-10-CM | POA: Diagnosis not present

## 2012-10-02 DIAGNOSIS — E039 Hypothyroidism, unspecified: Secondary | ICD-10-CM | POA: Diagnosis not present

## 2012-10-02 DIAGNOSIS — Z79899 Other long term (current) drug therapy: Secondary | ICD-10-CM | POA: Diagnosis not present

## 2012-10-02 DIAGNOSIS — E119 Type 2 diabetes mellitus without complications: Secondary | ICD-10-CM | POA: Diagnosis not present

## 2012-10-02 DIAGNOSIS — E785 Hyperlipidemia, unspecified: Secondary | ICD-10-CM | POA: Diagnosis not present

## 2012-10-02 DIAGNOSIS — I2510 Atherosclerotic heart disease of native coronary artery without angina pectoris: Secondary | ICD-10-CM | POA: Diagnosis not present

## 2012-10-02 DIAGNOSIS — K219 Gastro-esophageal reflux disease without esophagitis: Secondary | ICD-10-CM | POA: Diagnosis not present

## 2012-10-03 ENCOUNTER — Telehealth: Payer: Self-pay

## 2012-10-03 NOTE — Telephone Encounter (Signed)
Looks like very good echocardiogram with normal LV function Mild aortic valve stenosis High normal right ventricular systolic pressures

## 2012-10-03 NOTE — Telephone Encounter (Signed)
Pt would like echo results 

## 2012-10-03 NOTE — Telephone Encounter (Signed)
Please review

## 2012-10-06 NOTE — Telephone Encounter (Signed)
Pt informed Understanding verb 

## 2012-10-08 ENCOUNTER — Encounter (INDEPENDENT_AMBULATORY_CARE_PROVIDER_SITE_OTHER): Payer: Medicare Other

## 2012-10-08 DIAGNOSIS — G459 Transient cerebral ischemic attack, unspecified: Secondary | ICD-10-CM

## 2012-10-08 DIAGNOSIS — R930 Abnormal findings on diagnostic imaging of skull and head, not elsewhere classified: Secondary | ICD-10-CM

## 2012-10-15 LAB — IRON AND TIBC
Iron Saturation: 15 %
Iron: 41 ug/dL — ABNORMAL LOW (ref 50–170)

## 2012-10-15 LAB — CANCER CENTER HEMOGLOBIN: HGB: 11.8 g/dL — ABNORMAL LOW (ref 12.0–16.0)

## 2012-10-17 DIAGNOSIS — D509 Iron deficiency anemia, unspecified: Secondary | ICD-10-CM | POA: Diagnosis not present

## 2012-10-17 DIAGNOSIS — Z79899 Other long term (current) drug therapy: Secondary | ICD-10-CM | POA: Diagnosis not present

## 2012-10-20 LAB — OCCULT BLOOD X 1 CARD TO LAB, STOOL
Occult Blood, Feces: NEGATIVE
Occult Blood, Feces: NEGATIVE

## 2012-10-22 DIAGNOSIS — Z86010 Personal history of colonic polyps: Secondary | ICD-10-CM | POA: Diagnosis not present

## 2012-10-26 ENCOUNTER — Other Ambulatory Visit: Payer: Self-pay | Admitting: Family Medicine

## 2012-11-02 ENCOUNTER — Ambulatory Visit: Payer: Self-pay | Admitting: Internal Medicine

## 2012-11-06 ENCOUNTER — Ambulatory Visit: Payer: Self-pay | Admitting: Unknown Physician Specialty

## 2012-11-06 DIAGNOSIS — K620 Anal polyp: Secondary | ICD-10-CM | POA: Diagnosis not present

## 2012-11-06 DIAGNOSIS — K5730 Diverticulosis of large intestine without perforation or abscess without bleeding: Secondary | ICD-10-CM | POA: Diagnosis not present

## 2012-11-06 DIAGNOSIS — Z86010 Personal history of colonic polyps: Secondary | ICD-10-CM | POA: Diagnosis not present

## 2012-11-06 DIAGNOSIS — D128 Benign neoplasm of rectum: Secondary | ICD-10-CM | POA: Diagnosis not present

## 2012-11-06 DIAGNOSIS — D126 Benign neoplasm of colon, unspecified: Secondary | ICD-10-CM | POA: Diagnosis not present

## 2012-11-06 DIAGNOSIS — Z09 Encounter for follow-up examination after completed treatment for conditions other than malignant neoplasm: Secondary | ICD-10-CM | POA: Diagnosis not present

## 2012-11-07 DIAGNOSIS — R6889 Other general symptoms and signs: Secondary | ICD-10-CM | POA: Diagnosis not present

## 2012-11-07 LAB — HM COLONOSCOPY

## 2012-11-08 ENCOUNTER — Inpatient Hospital Stay: Payer: Self-pay | Admitting: Specialist

## 2012-11-08 DIAGNOSIS — I10 Essential (primary) hypertension: Secondary | ICD-10-CM | POA: Diagnosis present

## 2012-11-08 DIAGNOSIS — G4733 Obstructive sleep apnea (adult) (pediatric): Secondary | ICD-10-CM | POA: Diagnosis present

## 2012-11-08 DIAGNOSIS — D62 Acute posthemorrhagic anemia: Secondary | ICD-10-CM | POA: Diagnosis present

## 2012-11-08 DIAGNOSIS — D72829 Elevated white blood cell count, unspecified: Secondary | ICD-10-CM | POA: Diagnosis not present

## 2012-11-08 DIAGNOSIS — E119 Type 2 diabetes mellitus without complications: Secondary | ICD-10-CM | POA: Diagnosis not present

## 2012-11-08 DIAGNOSIS — F329 Major depressive disorder, single episode, unspecified: Secondary | ICD-10-CM | POA: Diagnosis present

## 2012-11-08 DIAGNOSIS — E039 Hypothyroidism, unspecified: Secondary | ICD-10-CM | POA: Diagnosis present

## 2012-11-08 DIAGNOSIS — E7800 Pure hypercholesterolemia, unspecified: Secondary | ICD-10-CM | POA: Diagnosis present

## 2012-11-08 DIAGNOSIS — I498 Other specified cardiac arrhythmias: Secondary | ICD-10-CM | POA: Diagnosis present

## 2012-11-08 DIAGNOSIS — Z4682 Encounter for fitting and adjustment of non-vascular catheter: Secondary | ICD-10-CM | POA: Diagnosis not present

## 2012-11-08 DIAGNOSIS — M1990 Unspecified osteoarthritis, unspecified site: Secondary | ICD-10-CM | POA: Diagnosis present

## 2012-11-08 DIAGNOSIS — R579 Shock, unspecified: Secondary | ICD-10-CM | POA: Diagnosis not present

## 2012-11-08 DIAGNOSIS — N6019 Diffuse cystic mastopathy of unspecified breast: Secondary | ICD-10-CM | POA: Diagnosis present

## 2012-11-08 DIAGNOSIS — K5730 Diverticulosis of large intestine without perforation or abscess without bleeding: Secondary | ICD-10-CM | POA: Diagnosis not present

## 2012-11-08 DIAGNOSIS — E872 Acidosis, unspecified: Secondary | ICD-10-CM | POA: Diagnosis not present

## 2012-11-08 DIAGNOSIS — I2510 Atherosclerotic heart disease of native coronary artery without angina pectoris: Secondary | ICD-10-CM | POA: Diagnosis present

## 2012-11-08 DIAGNOSIS — K922 Gastrointestinal hemorrhage, unspecified: Secondary | ICD-10-CM | POA: Diagnosis not present

## 2012-11-08 DIAGNOSIS — K633 Ulcer of intestine: Secondary | ICD-10-CM | POA: Diagnosis not present

## 2012-11-08 DIAGNOSIS — K3189 Other diseases of stomach and duodenum: Secondary | ICD-10-CM | POA: Diagnosis present

## 2012-11-08 DIAGNOSIS — J449 Chronic obstructive pulmonary disease, unspecified: Secondary | ICD-10-CM | POA: Diagnosis present

## 2012-11-08 DIAGNOSIS — A419 Sepsis, unspecified organism: Secondary | ICD-10-CM | POA: Diagnosis not present

## 2012-11-08 DIAGNOSIS — IMO0002 Reserved for concepts with insufficient information to code with codable children: Secondary | ICD-10-CM | POA: Diagnosis not present

## 2012-11-08 DIAGNOSIS — K219 Gastro-esophageal reflux disease without esophagitis: Secondary | ICD-10-CM | POA: Diagnosis present

## 2012-11-08 DIAGNOSIS — F40298 Other specified phobia: Secondary | ICD-10-CM | POA: Diagnosis present

## 2012-11-08 DIAGNOSIS — F411 Generalized anxiety disorder: Secondary | ICD-10-CM | POA: Diagnosis present

## 2012-11-08 DIAGNOSIS — E785 Hyperlipidemia, unspecified: Secondary | ICD-10-CM | POA: Diagnosis present

## 2012-11-08 DIAGNOSIS — J9600 Acute respiratory failure, unspecified whether with hypoxia or hypercapnia: Secondary | ICD-10-CM | POA: Diagnosis not present

## 2012-11-08 DIAGNOSIS — K921 Melena: Secondary | ICD-10-CM | POA: Diagnosis not present

## 2012-11-08 LAB — BASIC METABOLIC PANEL
Chloride: 108 mmol/L — ABNORMAL HIGH (ref 98–107)
Co2: 23 mmol/L (ref 21–32)
Creatinine: 0.83 mg/dL (ref 0.60–1.30)
EGFR (Non-African Amer.): >60
Glucose: 279 mg/dL — ABNORMAL HIGH (ref 65–99)
Potassium: 5.1 mmol/L (ref 3.5–5.1)
Sodium: 137 mmol/L (ref 136–145)

## 2012-11-08 LAB — URINALYSIS, COMPLETE
Bilirubin,UR: NEGATIVE
Blood: NEGATIVE
Glucose,UR: 150 mg/dL
Hyaline Cast: 12
Ketone: NEGATIVE
Leukocyte Esterase: NEGATIVE
Nitrite: NEGATIVE
Ph: 6
Protein: NEGATIVE
RBC,UR: 1 /HPF
Specific Gravity: 1.008
Squamous Epithelial: NONE SEEN
WBC UR: 1 /HPF

## 2012-11-08 LAB — CBC WITH DIFFERENTIAL/PLATELET
Basophil #: 0.1 10*3/uL
Basophil %: 1 %
Eosinophil #: 0 10*3/uL (ref 0.0–0.7)
Eosinophil #: 0.2 10*3/uL
Eosinophil %: 1.2 %
HCT: 29.1 % — ABNORMAL LOW
HGB: 9.7 g/dL — ABNORMAL LOW (ref 12.0–16.0)
HGB: 9.8 g/dL — ABNORMAL LOW
Lymphocyte #: 0.6 10*3/uL — ABNORMAL LOW (ref 1.0–3.6)
Lymphocyte %: 15.5 %
Lymphs Abs: 2 10*3/uL
MCH: 28.1 pg
MCHC: 33.8 g/dL
MCHC: 34.4 g/dL (ref 32.0–36.0)
MCV: 83 fL
MCV: 85 fL (ref 80–100)
Monocyte #: 0.5 x10 3/mm (ref 0.2–0.9)
Monocyte #: 0.8 10*3/uL
Monocyte %: 4.6 %
Monocyte %: 6.4 %
Neutrophil #: 10.4 10*3/uL — ABNORMAL HIGH (ref 1.4–6.5)
Neutrophil #: 9.8 10*3/uL — ABNORMAL HIGH
Neutrophil %: 75.9 %
Neutrophil %: 89.9 %
Platelet: 136 10*3/uL — ABNORMAL LOW (ref 150–440)
Platelet: 186 10*3/uL
RBC: 3.31 10*6/uL — ABNORMAL LOW (ref 3.80–5.20)
RBC: 3.51 X10 6/mm 3 — ABNORMAL LOW
RDW: 14.9 % — ABNORMAL HIGH (ref 11.5–14.5)
RDW: 15.1 % — ABNORMAL HIGH
WBC: 12.9 10*3/uL — ABNORMAL HIGH

## 2012-11-08 LAB — COMPREHENSIVE METABOLIC PANEL WITH GFR
Albumin: 2.3 g/dL — ABNORMAL LOW
Alkaline Phosphatase: 104 U/L
Anion Gap: 9
BUN: 8 mg/dL
Bilirubin,Total: 0.3 mg/dL
Calcium, Total: 7.7 mg/dL — ABNORMAL LOW
Chloride: 106 mmol/L
Co2: 24 mmol/L
Creatinine: 0.84 mg/dL
EGFR (African American): >60
EGFR (Non-African Amer.): >60
Glucose: 188 mg/dL — ABNORMAL HIGH
Osmolality: 281
Potassium: 3.5 mmol/L
SGOT(AST): 16 U/L
SGPT (ALT): 18 U/L
Sodium: 139 mmol/L
Total Protein: 5.7 g/dL — ABNORMAL LOW

## 2012-11-08 LAB — PROTIME-INR
INR: 1.2
Prothrombin Time: 15.5 secs — ABNORMAL HIGH (ref 11.5–14.7)
Prothrombin Time: 16.7 secs — ABNORMAL HIGH (ref 11.5–14.7)

## 2012-11-08 LAB — SALICYLATE LEVEL: Salicylates, Serum: <1.7 mg/dL

## 2012-11-08 LAB — HEMOGLOBIN: HGB: 10.1 g/dL — ABNORMAL LOW (ref 12.0–16.0)

## 2012-11-09 LAB — CBC WITH DIFFERENTIAL/PLATELET
Basophil #: 0.1 10*3/uL (ref 0.0–0.1)
Basophil %: 0.7 %
Eosinophil #: 0.2 10*3/uL (ref 0.0–0.7)
HGB: 8.6 g/dL — ABNORMAL LOW (ref 12.0–16.0)
Lymphocyte #: 1.7 10*3/uL (ref 1.0–3.6)
Lymphocyte %: 22.9 %
MCH: 29.4 pg (ref 26.0–34.0)
MCHC: 34.6 g/dL (ref 32.0–36.0)
MCV: 85 fL (ref 80–100)
Neutrophil #: 4.8 10*3/uL (ref 1.4–6.5)
Neutrophil %: 66 %
Platelet: 121 10*3/uL — ABNORMAL LOW (ref 150–440)

## 2012-11-09 LAB — BASIC METABOLIC PANEL
Calcium, Total: 7.2 mg/dL — ABNORMAL LOW (ref 8.5–10.1)
Co2: 28 mmol/L (ref 21–32)
Creatinine: 0.68 mg/dL (ref 0.60–1.30)
EGFR (African American): >60
EGFR (Non-African Amer.): >60
Osmolality: 285 (ref 275–301)
Potassium: 3.9 mmol/L (ref 3.5–5.1)
Sodium: 144 mmol/L (ref 136–145)

## 2012-11-09 LAB — HEMOGLOBIN: HGB: 9.2 g/dL — ABNORMAL LOW (ref 12.0–16.0)

## 2012-11-10 LAB — CBC WITH DIFFERENTIAL/PLATELET
Basophil %: 0.7 %
HCT: 25.7 % — ABNORMAL LOW (ref 35.0–47.0)
HGB: 8.9 g/dL — ABNORMAL LOW (ref 12.0–16.0)
Lymphocyte %: 21 %
MCHC: 34.7 g/dL (ref 32.0–36.0)
Monocyte #: 0.5 x10 3/mm (ref 0.2–0.9)
RBC: 3.03 10*6/uL — ABNORMAL LOW (ref 3.80–5.20)
RDW: 14.9 % — ABNORMAL HIGH (ref 11.5–14.5)
WBC: 7.2 10*3/uL (ref 3.6–11.0)

## 2012-11-10 LAB — PATHOLOGY REPORT

## 2012-11-11 LAB — CBC WITH DIFFERENTIAL/PLATELET
Basophil %: 0.6 %
Eosinophil #: 0.2 10*3/uL (ref 0.0–0.7)
Eosinophil %: 2.7 %
HCT: 26.7 % — ABNORMAL LOW (ref 35.0–47.0)
MCH: 29.1 pg (ref 26.0–34.0)
MCHC: 34.1 g/dL (ref 32.0–36.0)
MCV: 85 fL (ref 80–100)
Monocyte #: 0.5 x10 3/mm (ref 0.2–0.9)
Monocyte %: 6.2 %
Neutrophil #: 5.9 10*3/uL (ref 1.4–6.5)
Neutrophil %: 68 %
Platelet: 158 10*3/uL (ref 150–440)
RBC: 3.13 10*6/uL — ABNORMAL LOW (ref 3.80–5.20)
RDW: 14.9 % — ABNORMAL HIGH (ref 11.5–14.5)
WBC: 8.7 10*3/uL (ref 3.6–11.0)

## 2012-11-11 LAB — TROPONIN I: Troponin-I: 0.04 ng/mL

## 2012-11-12 ENCOUNTER — Ambulatory Visit: Payer: Self-pay | Admitting: Internal Medicine

## 2012-11-12 DIAGNOSIS — E039 Hypothyroidism, unspecified: Secondary | ICD-10-CM | POA: Diagnosis not present

## 2012-11-12 DIAGNOSIS — I2510 Atherosclerotic heart disease of native coronary artery without angina pectoris: Secondary | ICD-10-CM | POA: Diagnosis not present

## 2012-11-12 DIAGNOSIS — R5381 Other malaise: Secondary | ICD-10-CM | POA: Diagnosis not present

## 2012-11-12 DIAGNOSIS — Z794 Long term (current) use of insulin: Secondary | ICD-10-CM | POA: Diagnosis not present

## 2012-11-12 DIAGNOSIS — D509 Iron deficiency anemia, unspecified: Secondary | ICD-10-CM | POA: Diagnosis not present

## 2012-11-12 DIAGNOSIS — E785 Hyperlipidemia, unspecified: Secondary | ICD-10-CM | POA: Diagnosis not present

## 2012-11-12 DIAGNOSIS — Z79899 Other long term (current) drug therapy: Secondary | ICD-10-CM | POA: Diagnosis not present

## 2012-11-12 DIAGNOSIS — E119 Type 2 diabetes mellitus without complications: Secondary | ICD-10-CM | POA: Diagnosis not present

## 2012-11-12 DIAGNOSIS — I10 Essential (primary) hypertension: Secondary | ICD-10-CM | POA: Diagnosis not present

## 2012-11-12 DIAGNOSIS — K219 Gastro-esophageal reflux disease without esophagitis: Secondary | ICD-10-CM | POA: Diagnosis not present

## 2012-11-12 DIAGNOSIS — R079 Chest pain, unspecified: Secondary | ICD-10-CM | POA: Diagnosis not present

## 2012-11-13 ENCOUNTER — Telehealth: Payer: Self-pay

## 2012-11-13 ENCOUNTER — Encounter: Payer: Self-pay | Admitting: Family Medicine

## 2012-11-13 ENCOUNTER — Ambulatory Visit (INDEPENDENT_AMBULATORY_CARE_PROVIDER_SITE_OTHER): Payer: Medicare Other | Admitting: Family Medicine

## 2012-11-13 VITALS — BP 120/60 | HR 60 | Temp 98.0°F | Ht 66.0 in

## 2012-11-13 DIAGNOSIS — R5381 Other malaise: Secondary | ICD-10-CM

## 2012-11-13 DIAGNOSIS — R0989 Other specified symptoms and signs involving the circulatory and respiratory systems: Secondary | ICD-10-CM

## 2012-11-13 DIAGNOSIS — R5383 Other fatigue: Secondary | ICD-10-CM | POA: Diagnosis not present

## 2012-11-13 DIAGNOSIS — K922 Gastrointestinal hemorrhage, unspecified: Secondary | ICD-10-CM

## 2012-11-13 DIAGNOSIS — R0600 Dyspnea, unspecified: Secondary | ICD-10-CM

## 2012-11-13 DIAGNOSIS — R7989 Other specified abnormal findings of blood chemistry: Secondary | ICD-10-CM

## 2012-11-13 DIAGNOSIS — R0609 Other forms of dyspnea: Secondary | ICD-10-CM | POA: Diagnosis not present

## 2012-11-13 LAB — CBC WITH DIFFERENTIAL/PLATELET
Basophils Relative: 0.5 % (ref 0.0–3.0)
Eosinophils Relative: 2.2 % (ref 0.0–5.0)
HCT: 29 % — ABNORMAL LOW (ref 36.0–46.0)
Lymphs Abs: 2 10*3/uL (ref 0.7–4.0)
MCV: 90.2 fl (ref 78.0–100.0)
Monocytes Absolute: 0.7 10*3/uL (ref 0.1–1.0)
Monocytes Relative: 6.7 % (ref 3.0–12.0)
Neutrophils Relative %: 71.2 % (ref 43.0–77.0)
RBC: 3.22 Mil/uL — ABNORMAL LOW (ref 3.87–5.11)
WBC: 10.6 10*3/uL — ABNORMAL HIGH (ref 4.5–10.5)

## 2012-11-13 LAB — BASIC METABOLIC PANEL
CO2: 26 mEq/L (ref 19–32)
Calcium: 8.8 mg/dL (ref 8.4–10.5)
Chloride: 108 mEq/L (ref 96–112)
Glucose, Bld: 81 mg/dL (ref 70–99)
Sodium: 142 mEq/L (ref 135–145)

## 2012-11-13 MED ORDER — LORAZEPAM 0.5 MG PO TABS: 0.5000 mg | ORAL_TABLET | Freq: Three times a day (TID) | ORAL | Status: DC

## 2012-11-13 NOTE — Progress Notes (Addendum)
Nature conservation officer at [facility] [address] [place] [address] Phone: [phone] Fax: [phone]  Date:  11/13/2012   Name:  [name]   DOB:  Jan 29, 1946   MRN:  [id number] Gender: female Age: 67 y.o.  Primary Physician:  Ruthe Mannan, MD  Evaluating MD: Hannah Beat, MD   Chief Complaint: Shortness of Breath   History of Present Illness:  [name] is a 67 y.o. pleasant patient who presents with the following:  11/07/2012, emergency surgery due to GI hemorrhage:  Upon review of the medical record, and d/c summary, the patient had a post-colonoscopy hemorrhage and required 2 units of PRBC's. She had acute respiratory failure and remained intubated post-colonoscopy, and remained intubated overnight.   11/08/2012: date of admission 11/11/2012: date of discharge - initial date of contact with our practice 11/13/2012 by myself face to face.  Last Thursday had colonoscopy, had emergency surgery at [facility]. Dr. Mechele Collin. Hgb of 9.8 when left the hospital and had gone down to 8.   She has not been moving much, and she is now has a feeling of shortness of breath. Feels very weak all over. Somewhat faint.  Patient Active Problem List   Diagnosis Date Noted  . Abnormal MRI of head 09/05/2012  . Depression with anxiety 03/21/2012  . Extrinsic asthma 11/12/2011  . Anemia 06/07/2011  . PULMONARY NODULE, SOLITARY 01/25/2010  . UNSPECIFIED HYPOTHYROIDISM 08/10/2009  . MICROSCOPIC HEMATURIA 05/07/2009  . GOITER 03/02/2009  . HYPERTENSION, BENIGN 01/06/2009  . MEMORY LOSS 06/23/2008  . OBSTRUCTIVE SLEEP APNEA 12/25/2007  . CORONARY ARTERY DISEASE 11/11/2007  . DIABETES MELLITUS, TYPE II 09/09/2006  . HYPERCHOLESTEROLEMIA 09/09/2006  . ALLERGIC RHINITIS 09/09/2006  . GERD 09/09/2006  . DIVERTICULOSIS, COLON 09/09/2006    Past Medical History  Diagnosis Date  . Allergic rhinitis   . Diabetes mellitus     type 2  . Diverticulitis of colon   . GERD  (gastroesophageal reflux disease)   . Chronic depression   . Hypercholesterolemia     intolerance of statins and niaspan  . Apnea, sleep     mild, intolerant of cpap  . Coronary artery disease   . ACE-inhibitor cough   . Cataract   . Chronic headache   . Asthma, acute   . Hearing loss     Past Surgical History  Procedure Laterality Date  . Cholecystectomy  2001  . Colectomy      lap sigmoid  . Tonsillectomy    . Bartholin gland cyst excision    . Tubal ligation    . Bladder suspension    . Breast cyst excision  1988    bilateral nonmalignant tumors, x3  . Vaginal delivery      3  . Thyroidectomy    . Abdominal hysterectomy w/ partial vaginactomy    . Appendectomy  1964  . Cardiac catheterization    . Carotid stent      History   Social History  . Marital Status: Married    Spouse Name: N/A    Number of Children: 3  . Years of Education: N/A   Occupational History  . Retired     Lawyer   Social History Main Topics  . Smoking status: Former Smoker -- 1.00 packs/day for 30 years    Types: Cigarettes    Quit date: 02/03/2008  . Smokeless tobacco: Never Used  . Alcohol Use: No  . Drug Use: No  . Sexually Active: Not on file  Other Topics Concern  . Not on file   Social History Narrative  . No narrative on file    Family History  Problem Relation Age of Onset  . Breast cancer Mother     breast  . Hypertension Father   . Cancer Father     mesothelioma  . Asthma Father   . Stroke Paternal Grandfather   . Heart disease      Allergies  Allergen Reactions  . Budesonide-Formoterol Fumarate     Shakiness, tremors  . Atorvastatin   . Bupropion Hcl   . Codeine Sulfate   . Gabapentin   . Latex   . Lisinopril     REACTION: cough  . Meperidine Hcl   . Metformin     REACTION: unspecified  . Mirtazapine   . Mometasone Furoate     REACTION: Nause and vomiting  . Morphine Sulfate     REACTION: Chest pain  . Olanzapine   . Other     Beta Blockers,  reaction unknown but listed  . Oxycodone-Acetaminophen   . Propoxyphene-Acetaminophen   . Rosuvastatin     REACTION: Makes patient weak.  . Shellfish Allergy   . Venlafaxine     REACTION: unspecified  . Zolpidem Tartrate     REACTION: Jittery, diarrhea    Medication list has been reviewed and updated.  Outpatient Prescriptions Prior to Visit  Medication Sig Dispense Refill  . aspirin EC 81 MG tablet Take 1 tablet (81 mg total) by mouth daily.  150 tablet  2  . Calcium-Vitamin D (CALTRATE 600 PLUS-VIT D PO) Take 1 tablet by mouth 2 (two) times daily.       Marland Kitchen FLUoxetine (PROZAC) 40 MG capsule TAKE 1 CAPSULE TWICE DAILY  180 capsule  0  . glucose blood test strip (OneTouch Ultra test strips) use to check blood sugar four times daily  100 each  11  . insulin aspart (NOVOLOG) 100 UNIT/ML injection Inject into the skin 3 (three) times daily before meals. As needed.      . insulin lispro (HUMALOG) 100 UNIT/ML injection Take 10 units in the am, 15 units at noon & 15 units in the pm.      . Insulin Pen Needle 32G X 6 MM MISC Use twice daily with byetta  100 each  5  . lansoprazole (PREVACID) 30 MG capsule Take one by mouth daily  90 capsule  1  . levothyroxine (SYNTHROID, LEVOTHROID) 150 MCG tablet Take 150 mcg by mouth daily.        . nitroGLYCERIN (NITROLINGUAL) 0.4 MG/SPRAY spray Place 1 spray under the tongue as needed. Chest pain  12 g  3  . traMADol (ULTRAM) 50 MG tablet Take 1 tablet (50 mg total) by mouth every 6 (six) hours as needed for pain.  50 tablet  2  . traZODone (DESYREL) 100 MG tablet TAKE THREE TABLETS BY MOUTH AT BEDTIME  90 tablet  3  . furosemide (LASIX) 20 MG tablet Take 40 mg by mouth daily as needed.      . potassium chloride (K-DUR) 10 MEQ tablet Take 20 mEq by mouth daily as needed.       No facility-administered medications prior to visit.    Review of Systems:  As above. No cp, sob+, no mental status changes or blurred vision, slurred speech. Otherwise, the  pertinent positives and negatives are listed above and in the HPI, otherwise a full review of systems has been reviewed and is negative unless  noted positive.   Physical Examination: BP 120/60  Pulse 60  Temp(Src) 98 F (36.7 C) (Oral)  Ht 5\' 6"  (1.676 m)  SpO2 96%  Ideal Body Weight: Weight in (lb) to have BMI = 25: 154.6   GEN: mildly ill, laying down. Non-toxic, A & O x 3 HEENT: Atraumatic, Normocephalic. Neck supple. No masses, No LAD. Ears and Nose: No external deformity. CV: RRR, No M/G/R. No JVD. No thrill. No extra heart sounds. PULM: CTA B, no wheezes, crackles, rhonchi. No retractions. No resp. distress. No accessory muscle use. EXTR: No c/c/e NEURO Normal gait.  PSYCH: Normally interactive. Conversant. Not depressed or anxious appearing.  Calm demeanor.    Assessment and Plan:  Dyspnea - Plan: D-dimer, quantitative, Basic metabolic panel  Other malaise and fatigue - Plan: CBC with Differential, Basic metabolic panel  GI hemorrhage - Plan: CBC with Differential, Basic metabolic panel  Weak with sob. Pulse 60 and 96% o2. Check d-dimer Check hgb - undoubtedly contributing to weakness Prn ativan - she had a benzo in the hospital with seemed to help with SOB and anxiety  Addendum: Elevated d-dimer, dyspnea, obtain a CT angiogram of the chest to evaluate for pulmonary embolism.  Results for orders placed in visit on 11/13/12  CBC WITH DIFFERENTIAL      Result Value Range   WBC 10.6 (*) 4.5 - 10.5 K/uL   RBC 3.22 (*) 3.87 - 5.11 Mil/uL   Hemoglobin 9.5 (*) 12.0 - 15.0 g/dL   HCT 19.1 (*) 47.8 - 29.5 %   MCV 90.2  78.0 - 100.0 fl   MCHC 32.7  30.0 - 36.0 g/dL   RDW 62.1 (*) 30.8 - 65.7 %   Platelets 193.0  150.0 - 400.0 K/uL   Neutrophils Relative % 71.2  43.0 - 77.0 %   Lymphocytes Relative 19.4  12.0 - 46.0 %   Monocytes Relative 6.7  3.0 - 12.0 %   Eosinophils Relative 2.2  0.0 - 5.0 %   Basophils Relative 0.5  0.0 - 3.0 %   Neutro Abs 7.5  1.4 - 7.7 K/uL     Lymphs Abs 2.0  0.7 - 4.0 K/uL   Monocytes Absolute 0.7  0.1 - 1.0 K/uL   Eosinophils Absolute 0.2  0.0 - 0.7 K/uL   Basophils Absolute 0.0  0.0 - 0.1 K/uL  D-DIMER, QUANTITATIVE      Result Value Range   D-Dimer, Quant 1.59 (*) 0.00 - 0.48 ug/mL-FEU  BASIC METABOLIC PANEL      Result Value Range   Sodium 142  135 - 145 mEq/L   Potassium 4.0  3.5 - 5.1 mEq/L   Chloride 108  96 - 112 mEq/L   CO2 26  19 - 32 mEq/L   Glucose, Bld 81  70 - 99 mg/dL   BUN 4 (*) 6 - 23 mg/dL   Creatinine, Ser 0.7  0.4 - 1.2 mg/dL   Calcium 8.8  8.4 - 84.6 mg/dL   GFR 96.29  >52.84 mL/min     Orders Today:  Orders Placed This Encounter  Procedures  . CBC with Differential  . D-dimer, quantitative  . Basic metabolic panel    Updated Medication List: (Includes new medications, updates to list, dose adjustments) Meds ordered this encounter  Medications  . LORazepam (ATIVAN) 0.5 MG tablet    Sig: Take 1 tablet (0.5 mg total) by mouth every 8 (eight) hours.    Dispense:  30 tablet    Refill:  0  Medications Discontinued: There are no discontinued medications.    Signed, Elpidio Galea. Juwuan Sedita, MD 11/13/2012 10:43 AM

## 2012-11-13 NOTE — Telephone Encounter (Signed)
Spoke with patient and she did not go to ER so she will come to see Dr. Patsy Lager at 10:30

## 2012-11-13 NOTE — Telephone Encounter (Signed)
Triage Record Num: [id number] Operator: Malachi Paradise Patient Name: [name] Call Date & Time: 11/12/2012 4:52:35PM Patient Phone: [phone] PCP: Ruthe Mannan Patient Gender: Female PCP Fax : [phone] Patient DOB: August 24, 1945 Practice Name: [facility] Reason for Call: Caller: [name]/Patient; PCP: Ruthe Mannan (Family Practice); CB#: [phone]; Call regarding SOB; Onset 11/11/12 Pt states she has emergency surgery due to hemorrhage after colonoscopy 11/07/12. Pt states she has SOB and feels it is due to anxiety. Pt wanting to know if Dr Dayton Martes will call her in something for anxiety. Emergent symptoms new or worsening breathing problems that have not been evaluated positive per Breathing Problem protocol. Referred pt to [facility] for evaluation. Protocol(s) Used: Breathing Problems Recommended Outcome per Protocol: See ED Immediately Reason for Outcome: New or worsening breathing problems that have not been evaluated Care Advice: ~ 06/

## 2012-11-14 ENCOUNTER — Ambulatory Visit (INDEPENDENT_AMBULATORY_CARE_PROVIDER_SITE_OTHER)
Admission: RE | Admit: 2012-11-14 | Discharge: 2012-11-14 | Disposition: A | Discharge status: Home / Self Care | Payer: Medicare Other | Source: Ambulatory Visit | Attending: Family Medicine | Admitting: Family Medicine

## 2012-11-14 DIAGNOSIS — R791 Abnormal coagulation profile: Secondary | ICD-10-CM | POA: Diagnosis not present

## 2012-11-14 DIAGNOSIS — R7989 Other specified abnormal findings of blood chemistry: Secondary | ICD-10-CM

## 2012-11-14 DIAGNOSIS — R0989 Other specified symptoms and signs involving the circulatory and respiratory systems: Secondary | ICD-10-CM

## 2012-11-14 DIAGNOSIS — R0609 Other forms of dyspnea: Secondary | ICD-10-CM | POA: Diagnosis not present

## 2012-11-14 DIAGNOSIS — R0600 Dyspnea, unspecified: Secondary | ICD-10-CM

## 2012-11-14 LAB — D-DIMER, QUANTITATIVE: D-Dimer, Quant: 1.59 ug/mL-FEU — ABNORMAL HIGH (ref 0.00–0.48)

## 2012-11-14 MED ORDER — IOHEXOL 350 MG/ML SOLN
100.0000 mL | Freq: Once | INTRAVENOUS | Status: AC | PRN
  Administered 2012-11-14: 100 mL via INTRAVENOUS

## 2012-11-14 NOTE — Addendum Note (Signed)
Addended by: Hannah Beat on: 11/14/2012 08:17 AM   Modules accepted: Orders

## 2012-11-17 ENCOUNTER — Encounter: Payer: Self-pay | Admitting: Family Medicine

## 2012-11-17 ENCOUNTER — Ambulatory Visit (INDEPENDENT_AMBULATORY_CARE_PROVIDER_SITE_OTHER): Payer: Medicare Other | Admitting: Family Medicine

## 2012-11-17 ENCOUNTER — Encounter: Payer: Self-pay | Admitting: Radiology

## 2012-11-17 VITALS — BP 152/60 | HR 66 | Temp 97.8°F | Wt 189.0 lb

## 2012-11-17 DIAGNOSIS — R0600 Dyspnea, unspecified: Secondary | ICD-10-CM

## 2012-11-17 DIAGNOSIS — R5381 Other malaise: Secondary | ICD-10-CM | POA: Diagnosis not present

## 2012-11-17 DIAGNOSIS — R0609 Other forms of dyspnea: Secondary | ICD-10-CM | POA: Diagnosis not present

## 2012-11-17 DIAGNOSIS — K922 Gastrointestinal hemorrhage, unspecified: Secondary | ICD-10-CM | POA: Diagnosis not present

## 2012-11-17 DIAGNOSIS — R531 Weakness: Secondary | ICD-10-CM | POA: Insufficient documentation

## 2012-11-17 DIAGNOSIS — R0989 Other specified symptoms and signs involving the circulatory and respiratory systems: Secondary | ICD-10-CM | POA: Diagnosis not present

## 2012-11-17 LAB — CBC WITH DIFFERENTIAL/PLATELET
Basophils Absolute: 0 10*3/uL (ref 0.0–0.1)
Basophils Relative: 0.4 % (ref 0.0–3.0)
Eosinophils Absolute: 0.1 10*3/uL (ref 0.0–0.7)
HCT: 28.7 % — ABNORMAL LOW (ref 36.0–46.0)
Hemoglobin: 9.5 g/dL — ABNORMAL LOW (ref 12.0–15.0)
Lymphocytes Relative: 12.2 % (ref 12.0–46.0)
Lymphs Abs: 0.9 10*3/uL (ref 0.7–4.0)
MCHC: 33 g/dL (ref 30.0–36.0)
MCV: 89.8 fl (ref 78.0–100.0)
Monocytes Absolute: 0.5 10*3/uL (ref 0.1–1.0)
Neutro Abs: 6.1 10*3/uL (ref 1.4–7.7)
RBC: 3.19 Mil/uL — ABNORMAL LOW (ref 3.87–5.11)
RDW: 15 % — ABNORMAL HIGH (ref 11.5–14.6)

## 2012-11-17 LAB — FERRITIN: Ferritin: 489.5 ng/mL — ABNORMAL HIGH (ref 10.0–291.0)

## 2012-11-17 LAB — IBC PANEL
Iron: 31 ug/dL — ABNORMAL LOW (ref 42–145)
Transferrin: 157.9 mg/dL — ABNORMAL LOW (ref 212.0–360.0)

## 2012-11-17 NOTE — Progress Notes (Signed)
[name] is a 67 y.o. pleasant female here for follow up.  Saw Dr. Patsy Lager last Thursday for ER follow up with persistent SOB and weakness.   11/07/2012, emergency surgery due to GI hemorrhage s/p colonoscopy.  Received 2 units PRBCs in the hospital.    She had acute respiratory failure and remained intubated post-colonoscopy, and remained intubated overnight.   She does not have follow up with Dr. Mechele Collin until July 3rd.  Hemoglobin was stable last week.  Still feels weak, SOB and nauseated.  BMs soft, no blood.  Has intermittent abdominal pain.    D dimer positive- CT angio neg for PE.  Lab Results  Component Value Date   WBC 10.6* 11/13/2012   HGB 9.5* 11/13/2012   HCT 29.0* 11/13/2012   MCV 90.2 11/13/2012   PLT 193.0 11/13/2012   Ct Angio Chest Pe W/cm &/or Wo Cm  11/14/2012   *RADIOLOGY REPORT*  Clinical Data: Dyspnea, elevated D-dimer recent hemorrhage after colonoscopy, evaluate for pulmonary embolism  CT ANGIOGRAPHY CHEST  Technique:  Multidetector CT imaging of the chest using the standard protocol during bolus administration of intravenous contrast. Multiplanar reconstructed images including MIPs were obtained and reviewed to evaluate the vascular anatomy.  Contrast: OMNIPAQUE IOHEXOL 350 MG/ML SOLN  Comparison: Chest CT - 08/30/2011; 02/21/2009  Vascular Findings:  There is adequate opacification of the pulmonary arterial system of the main pulmonary artery measuring approximately 305 HU.  There are no discrete filling defects within the pulmonary arterial tree to the level of the bilateral subsegmental pulmonary arteries. Evaluation of the distal subsegmental pulmonary arteries is degraded secondary to patient motion artifact and body habitus. Normal caliber of the main pulmonary artery.  Cardiomegaly.  Coronary artery calcifications. Calcifications of the mitral valve annulus.  Trace amount of pericardial fluid, presumably physiologic.  Scattered atherosclerotic plaque  within a normal caliber thoracic aorta.  There is mixed slightly irregular calcified and noncalcified atherosclerotic plaque within the descending thoracic aorta, not resulting in hemodynamically significant narrowing.  No definite thoracic aortic dissection or periaortic straining.  Bovine configuration of the aortic arch.  ----------------------------------------------------------------  Nonvascular findings:  Evaluation of the pulmonary parenchyma, in particular within the bilateral mid and lower lungs is degraded secondary to patient motion artifact.  There is mild diffuse ground glass opacification throughout the bilateral lungs.  There is minimal grossly symmetric subpleural ground-glass atelectasis.  Trace bilateral effusions are suspected.  No pneumothorax.  The central pulmonary airways are widely patent.  Scattered shoddy mediastinal lymph nodes are not enlarged by CT criteria with index prevascular node measuring 6 mm in short axis diameter (image 31, series four).  No definite hilar or axillary lymphadenopathy.  Limited early arterial phase evaluation of the upper abdomen is normal.  No acute or aggressive osseous abnormalities.  IMPRESSION:  1.  Negative for pulmonary embolism to the level of the bilateral subsegmental pulmonary arteries. 2.  Suspected mild pulmonary edema with trace bilateral pleural effusions.  Cardiomegaly, mitral valve annular calcifications and small amount of pericardial fluid, presumably physiologic.  Further evaluation with cardiac echo may be performed as clinically indicated. 3.  Atherosclerotic disease including coronary artery calcifications and irregular atherosclerotic plaque within a normal caliber thoracic aorta.   Original Report Authenticated By: Tacey Ruiz, MD    Patient Active Problem List   Diagnosis Date Noted  . Dyspnea 11/17/2012  . Weakness 11/17/2012  . Abnormal MRI of head 09/05/2012  . Depression with anxiety 03/21/2012  . Extrinsic asthma  11/12/2011  . Anemia 06/07/2011  . PULMONARY NODULE, SOLITARY 01/25/2010  . UNSPECIFIED HYPOTHYROIDISM 08/10/2009  . MICROSCOPIC HEMATURIA 05/07/2009  . GOITER 03/02/2009  . HYPERTENSION, BENIGN 01/06/2009  . MEMORY LOSS 06/23/2008  . OBSTRUCTIVE SLEEP APNEA 12/25/2007  . CORONARY ARTERY DISEASE 11/11/2007  . DIABETES MELLITUS, TYPE II 09/09/2006  . HYPERCHOLESTEROLEMIA 09/09/2006  . ALLERGIC RHINITIS 09/09/2006  . GERD 09/09/2006  . DIVERTICULOSIS, COLON 09/09/2006    Past Medical History  Diagnosis Date  . Allergic rhinitis   . Diabetes mellitus     type 2  . Diverticulitis of colon   . GERD (gastroesophageal reflux disease)   . Chronic depression   . Hypercholesterolemia     intolerance of statins and niaspan  . Apnea, sleep     mild, intolerant of cpap  . Coronary artery disease   . ACE-inhibitor cough   . Cataract   . Chronic headache   . Asthma, acute   . Hearing loss     Past Surgical History  Procedure Laterality Date  . Cholecystectomy  2001  . Colectomy      lap sigmoid  . Tonsillectomy    . Bartholin gland cyst excision    . Tubal ligation    . Bladder suspension    . Breast cyst excision  1988    bilateral nonmalignant tumors, x3  . Vaginal delivery      3  . Thyroidectomy    . Abdominal hysterectomy w/ partial vaginactomy    . Appendectomy  1964  . Cardiac catheterization    . Carotid stent      History   Social History  . Marital Status: Married    Spouse Name: N/A    Number of Children: 3  . Years of Education: N/A   Occupational History  . Retired     Lawyer   Social History Main Topics  . Smoking status: Former Smoker -- 1.00 packs/day for 30 years    Types: Cigarettes    Quit date: 02/03/2008  . Smokeless tobacco: Never Used  . Alcohol Use: No  . Drug Use: No  . Sexually Active: Not on file   Other Topics Concern  . Not on file   Social History Narrative  . No narrative on file    Family History  Problem Relation Age  of Onset  . Breast cancer Mother     breast  . Hypertension Father   . Cancer Father     mesothelioma  . Asthma Father   . Stroke Paternal Grandfather   . Heart disease      Allergies  Allergen Reactions  . Budesonide-Formoterol Fumarate     Shakiness, tremors  . Atorvastatin   . Bupropion Hcl   . Codeine Sulfate   . Gabapentin   . Latex   . Lisinopril     REACTION: cough  . Meperidine Hcl   . Metformin     REACTION: unspecified  . Mirtazapine   . Mometasone Furoate     REACTION: Nause and vomiting  . Morphine Sulfate     REACTION: Chest pain  . Olanzapine   . Other     Beta Blockers, reaction unknown but listed  . Oxycodone-Acetaminophen   . Propoxyphene-Acetaminophen   . Rosuvastatin     REACTION: Makes patient weak.  . Shellfish Allergy   . Venlafaxine     REACTION: unspecified  . Zolpidem Tartrate     REACTION: Jittery, diarrhea    Medication list has been  reviewed and updated.  Outpatient Prescriptions Prior to Visit  Medication Sig Dispense Refill  . Calcium-Vitamin D (CALTRATE 600 PLUS-VIT D PO) Take 1 tablet by mouth 2 (two) times daily.       Marland Kitchen FLUoxetine (PROZAC) 40 MG capsule TAKE 1 CAPSULE TWICE DAILY  180 capsule  0  . glucose blood test strip (OneTouch Ultra test strips) use to check blood sugar four times daily  100 each  11  . insulin aspart (NOVOLOG) 100 UNIT/ML injection Inject into the skin 3 (three) times daily before meals. As needed.      . insulin lispro (HUMALOG) 100 UNIT/ML injection Take 10 units in the am, 15 units at noon & 15 units in the pm.      . Insulin Pen Needle 32G X 6 MM MISC Use twice daily with byetta  100 each  5  . lansoprazole (PREVACID) 30 MG capsule Take one by mouth daily  90 capsule  1  . levothyroxine (SYNTHROID, LEVOTHROID) 150 MCG tablet Take 150 mcg by mouth daily.        Marland Kitchen LORazepam (ATIVAN) 0.5 MG tablet Take 1 tablet (0.5 mg total) by mouth every 8 (eight) hours.  30 tablet  0  . nitroGLYCERIN (NITROLINGUAL)  0.4 MG/SPRAY spray Place 1 spray under the tongue as needed. Chest pain  12 g  3  . traMADol (ULTRAM) 50 MG tablet Take 1 tablet (50 mg total) by mouth every 6 (six) hours as needed for pain.  50 tablet  2  . traZODone (DESYREL) 100 MG tablet TAKE THREE TABLETS BY MOUTH AT BEDTIME  90 tablet  3  . aspirin EC 81 MG tablet Take 1 tablet (81 mg total) by mouth daily.  150 tablet  2  . furosemide (LASIX) 20 MG tablet Take 40 mg by mouth daily as needed.      . potassium chloride (K-DUR) 10 MEQ tablet Take 20 mEq by mouth daily as needed.       No facility-administered medications prior to visit.    Review of Systems:  As above. No cp, sob+, no mental status changes or blurred vision, slurred speech. Otherwise, the pertinent positives and negatives are listed above and in the HPI, otherwise a full review of systems has been reviewed and is negative unless noted positive.   Physical Examination: BP 152/60  Pulse 66  Temp(Src) 97.8 F (36.6 C)  Wt 189 lb (85.73 kg)  BMI 30.52 kg/m2  SpO2 97%  GEN: appears pale, Non-toxic, A & O x 3 HEENT: Atraumatic, Normocephalic. Neck supple. No masses, No LAD. Ears and Nose: No external deformity. CV: RRR, No M/G/R. No JVD. No thrill. No extra heart sounds. PULM: CTA B, no wheezes, crackles, rhonchi. No retractions. No resp. distress. No accessory muscle use. EXTR: No c/c/e NEURO Normal gait.  PSYCH: Normally interactive. Conversant. Not depressed or anxious appearing.  Calm demeanor.   Assessment and Plan:  1. Dyspnea Persistent.  Hemoglobin stable last week, will recheck today along with TIBC, ferritin, iron since she has been receiving iron infusions from Dr. Sherrlyn Hock.  Will also ask Dr. Sherrlyn Hock to see her sooner. - CBC with Differential  2. Weakness See above. - CBC with Differential  3. GI bleed Needs to see Dr. Markham Jordan sooner than July 3rd.  Discussed this with pt and she will call his office.  She will let me know if we can help. - CBC  with Differential - Iron and TIBC - Ferritin - Ambulatory referral  to Hematology

## 2012-11-17 NOTE — Patient Instructions (Addendum)
Good to see you. We will call you with your lab results.  Please stop by to see Shirlee Limerick on your way out to set up your referral.  Please call Dr. Cyndie Mull office to work you in sooner.

## 2012-11-17 NOTE — Addendum Note (Signed)
Addended by: Alvina Chou on: 11/17/2012 12:18 PM   Modules accepted: Orders

## 2012-11-18 ENCOUNTER — Encounter: Payer: Self-pay | Admitting: Family Medicine

## 2012-11-18 DIAGNOSIS — D509 Iron deficiency anemia, unspecified: Secondary | ICD-10-CM | POA: Diagnosis not present

## 2012-11-18 DIAGNOSIS — Z79899 Other long term (current) drug therapy: Secondary | ICD-10-CM | POA: Diagnosis not present

## 2012-11-19 ENCOUNTER — Encounter: Payer: Self-pay | Admitting: Family Medicine

## 2012-11-19 ENCOUNTER — Other Ambulatory Visit: Payer: Self-pay | Admitting: Family Medicine

## 2012-11-20 ENCOUNTER — Encounter: Payer: Self-pay | Admitting: Family Medicine

## 2012-11-20 ENCOUNTER — Other Ambulatory Visit: Payer: Self-pay | Admitting: Family Medicine

## 2012-11-20 NOTE — Telephone Encounter (Signed)
Mr Vankuren called to ck status of fluoxetine refill; advised done today; Mr Allerton will ck with [facility].

## 2012-11-21 MED ORDER — LORAZEPAM 0.5 MG PO TABS
0.5000 mg | ORAL_TABLET | Freq: Three times a day (TID) | ORAL | Status: DC
Start: 2012-11-21 — End: 2012-12-02

## 2012-11-21 NOTE — Telephone Encounter (Signed)
Ok to refill.  It looks like we did not refill an entire month last time- my mistake.

## 2012-11-21 NOTE — Telephone Encounter (Signed)
pts husband left v/m requesting status of refill; I spoke with pt notified med called to [facility].

## 2012-11-21 NOTE — Telephone Encounter (Signed)
Medicine called to [facility].

## 2012-11-21 NOTE — Addendum Note (Signed)
Addended by: Eliezer Bottom on: 11/21/2012 02:26 PM   Modules accepted: Orders

## 2012-11-21 NOTE — Telephone Encounter (Signed)
[facility] is asking for refill on lorazepam. This was denied yesterday but pharmacist says she will run out over the week end.  Please advise if refill is ok.

## 2012-11-25 ENCOUNTER — Emergency Department: Payer: Self-pay | Admitting: Emergency Medicine

## 2012-11-25 DIAGNOSIS — R5381 Other malaise: Secondary | ICD-10-CM | POA: Diagnosis not present

## 2012-11-25 DIAGNOSIS — Z79899 Other long term (current) drug therapy: Secondary | ICD-10-CM | POA: Diagnosis not present

## 2012-11-25 DIAGNOSIS — R0602 Shortness of breath: Secondary | ICD-10-CM | POA: Diagnosis not present

## 2012-11-25 DIAGNOSIS — D509 Iron deficiency anemia, unspecified: Secondary | ICD-10-CM | POA: Diagnosis not present

## 2012-11-25 LAB — CBC CANCER CENTER
Eosinophil #: 0.2 x10 3/mm (ref 0.0–0.7)
Eosinophil %: 2.1 %
HGB: 11.6 g/dL — ABNORMAL LOW (ref 12.0–16.0)
Lymphocyte #: 1.6 x10 3/mm (ref 1.0–3.6)
Lymphocyte %: 18.7 %
MCH: 30.2 pg (ref 26.0–34.0)
MCHC: 34.3 g/dL (ref 32.0–36.0)
Monocyte #: 0.8 x10 3/mm (ref 0.2–0.9)
Platelet: 181 x10 3/mm (ref 150–440)
RBC: 3.84 10*6/uL (ref 3.80–5.20)
RDW: 16.4 % — ABNORMAL HIGH (ref 11.5–14.5)
WBC: 8.7 x10 3/mm (ref 3.6–11.0)

## 2012-11-25 LAB — COMPREHENSIVE METABOLIC PANEL
Albumin: 2.6 g/dL — ABNORMAL LOW (ref 3.4–5.0)
Alkaline Phosphatase: 206 U/L — ABNORMAL HIGH (ref 50–136)
Anion Gap: 8 (ref 7–16)
BUN: 8 mg/dL (ref 7–18)
Co2: 26 mmol/L (ref 21–32)
EGFR (African American): >60
EGFR (Non-African Amer.): >60
Glucose: 109 mg/dL — ABNORMAL HIGH (ref 65–99)
Osmolality: 276 (ref 275–301)
Potassium: 3.9 mmol/L (ref 3.5–5.1)
SGOT(AST): 37 U/L (ref 15–37)
SGPT (ALT): 30 U/L (ref 12–78)
Sodium: 139 mmol/L (ref 136–145)

## 2012-11-25 LAB — URINALYSIS, COMPLETE
Bacteria: NONE SEEN
Bilirubin,UR: NEGATIVE
Leukocyte Esterase: NEGATIVE
Nitrite: NEGATIVE
Ph: 6 (ref 4.5–8.0)
Specific Gravity: 1.017 (ref 1.003–1.030)
WBC UR: 2 /HPF (ref 0–5)

## 2012-11-25 LAB — TROPONIN I: Troponin-I: <0.02 ng/mL

## 2012-11-27 ENCOUNTER — Encounter: Payer: Self-pay | Admitting: Family Medicine

## 2012-12-02 ENCOUNTER — Other Ambulatory Visit: Payer: Self-pay | Admitting: *Deleted

## 2012-12-02 ENCOUNTER — Ambulatory Visit: Payer: Self-pay | Admitting: Internal Medicine

## 2012-12-02 DIAGNOSIS — Z794 Long term (current) use of insulin: Secondary | ICD-10-CM | POA: Diagnosis not present

## 2012-12-02 DIAGNOSIS — E1165 Type 2 diabetes mellitus with hyperglycemia: Secondary | ICD-10-CM | POA: Diagnosis not present

## 2012-12-02 MED ORDER — LORAZEPAM 0.5 MG PO TABS: 0.5000 mg | ORAL_TABLET | Freq: Three times a day (TID) | ORAL | Status: DC

## 2012-12-02 NOTE — Telephone Encounter (Signed)
Last filled 11/21/12 

## 2012-12-03 ENCOUNTER — Encounter: Payer: Self-pay | Admitting: *Deleted

## 2012-12-03 NOTE — Telephone Encounter (Signed)
Refill called to [facility]. 

## 2012-12-10 DIAGNOSIS — R0989 Other specified symptoms and signs involving the circulatory and respiratory systems: Secondary | ICD-10-CM | POA: Diagnosis not present

## 2012-12-10 DIAGNOSIS — R791 Abnormal coagulation profile: Secondary | ICD-10-CM

## 2012-12-10 DIAGNOSIS — R5381 Other malaise: Secondary | ICD-10-CM | POA: Diagnosis not present

## 2012-12-10 DIAGNOSIS — K922 Gastrointestinal hemorrhage, unspecified: Secondary | ICD-10-CM | POA: Diagnosis not present

## 2012-12-12 ENCOUNTER — Ambulatory Visit (INDEPENDENT_AMBULATORY_CARE_PROVIDER_SITE_OTHER): Payer: Medicare Other | Admitting: Critical Care Medicine

## 2012-12-12 ENCOUNTER — Encounter: Payer: Self-pay | Admitting: Critical Care Medicine

## 2012-12-12 VITALS — BP 132/56 | HR 65 | Temp 98.0°F | Ht 66.0 in | Wt 187.0 lb

## 2012-12-12 DIAGNOSIS — R0609 Other forms of dyspnea: Secondary | ICD-10-CM | POA: Diagnosis not present

## 2012-12-12 DIAGNOSIS — R0600 Dyspnea, unspecified: Secondary | ICD-10-CM

## 2012-12-12 DIAGNOSIS — J4540 Moderate persistent asthma, uncomplicated: Secondary | ICD-10-CM

## 2012-12-12 DIAGNOSIS — J45909 Unspecified asthma, uncomplicated: Secondary | ICD-10-CM

## 2012-12-12 DIAGNOSIS — G4733 Obstructive sleep apnea (adult) (pediatric): Secondary | ICD-10-CM | POA: Diagnosis not present

## 2012-12-12 DIAGNOSIS — R0989 Other specified symptoms and signs involving the circulatory and respiratory systems: Secondary | ICD-10-CM | POA: Diagnosis not present

## 2012-12-12 MED ORDER — LANSOPRAZOLE 30 MG PO CPDR: DELAYED_RELEASE_CAPSULE | ORAL | Status: DC

## 2012-12-12 MED ORDER — METOCLOPRAMIDE HCL 10 MG PO TABS: 10.0000 mg | ORAL_TABLET | Freq: Every day | ORAL | Status: DC

## 2012-12-12 MED ORDER — FAMOTIDINE 20 MG PO TABS: 20.0000 mg | ORAL_TABLET | Freq: Every day | ORAL | Status: DC

## 2012-12-12 NOTE — Progress Notes (Signed)
Subjective:    Patient ID: [name], female    DOB: 1946/03/24, 67 y.o.   MRN: [id number]  HPI  This is a 67 y/o female who was previously evaluated by Dr. Delford Field in [place] for shortness of breath on exertion.  She hada  Normal childhood without significant respiratory problems, however for the last several years she has had multiple bouts of bronchitis.  She was hospitalized in December 2012 for 3-4 days with bronchitis and was discharged with prednisone.  She states that she has been treated with prednisone for bronchitis for many years at least once per year.  She has been evaluated by Dr. Mariah Milling who found no evidence for ongoing coronary artery disease needing further work up.  She notes that she has been short of breath for years with exertion of minimal distances (less than 100 yards).  She has allergic rhinitis and she has been taking allergy shots twice a week for years. She has intermittent wheezing and chest tightness.  Dr. Lynelle Doctor notes that she was intolerant of symbicort in the past, but she is unsure why. She was instructed to take spiriva on her last visit with Dr. Delford Field but she has not been doing this.  She says that her breathing is doing better since her December hospitalization.     Former smoker: smoked 1ppd for 30-40 years  12/12/2012 Chief Complaint  Patient presents with  . Acute Visit    Increased SOB at rest and when walking - worse in the mornings.  SOB has worsened since June.  No wheezing, chest tightness, chest pain, or cough at this time.   Not seen since 11/2011.   Pt ok until had surgery colonoscopy and two polyps and bled out and surgery at [facility], 11/06/12.  On vent overnight LLL airspace dz.  No abx on d/c.  Pt still dyspneic, worse after breakfast, hard time breathing for several hours, this is daily, occ at night as well.  Dyspnea on exertion.  Pt staggers when walks.  No real mucus, no real cough.  Feels congested , cannot breathe out.  Past Medical History   Diagnosis Date  . Allergic rhinitis   . Diabetes mellitus     type 2  . Diverticulitis of colon   . GERD (gastroesophageal reflux disease)   . Chronic depression   . Hypercholesterolemia     intolerance of statins and niaspan  . Apnea, sleep     mild, intolerant of cpap  . Coronary artery disease   . ACE-inhibitor cough   . Cataract   . Chronic headache   . Asthma, acute   . Hearing loss      Family History  Problem Relation Age of Onset  . Breast cancer Mother     breast  . Hypertension Father   . Cancer Father     mesothelioma  . Asthma Father   . Stroke Paternal Grandfather   . Heart disease       History   Social History  . Marital Status: Married    Spouse Name: N/A    Number of Children: 3  . Years of Education: N/A   Occupational History  . Retired     Lawyer   Social History Main Topics  . Smoking status: Former Smoker -- 1.00 packs/day for 30 years    Types: Cigarettes    Quit date: 02/03/2008  . Smokeless tobacco: Never Used  . Alcohol Use: No  . Drug Use: No  . Sexually  Active: Not on file   Other Topics Concern  . Not on file   Social History Narrative  . No narrative on file     Allergies  Allergen Reactions  . Budesonide-Formoterol Fumarate     Shakiness, tremors  . Atorvastatin   . Bupropion Hcl   . Codeine Sulfate   . Gabapentin   . Latex   . Lisinopril     REACTION: cough  . Meperidine Hcl   . Metformin     REACTION: unspecified  . Mirtazapine   . Mometasone Furoate     REACTION: Nause and vomiting  . Morphine Sulfate     REACTION: Chest pain  . Olanzapine   . Other     Beta Blockers, reaction unknown but listed  . Oxycodone-Acetaminophen   . Propoxyphene-Acetaminophen   . Rosuvastatin     REACTION: Makes patient weak.  . Shellfish Allergy   . Venlafaxine     REACTION: unspecified  . Zolpidem Tartrate     REACTION: Jittery, diarrhea     Outpatient Prescriptions Prior to Visit  Medication Sig Dispense Refill   . Calcium-Vitamin D (CALTRATE 600 PLUS-VIT D PO) Take 1 tablet by mouth 2 (two) times daily.       Marland Kitchen FLUoxetine (PROZAC) 40 MG capsule TAKE 1 CAPSULE TWICE DAILY  180 capsule  0  . glucose blood test strip (OneTouch Ultra test strips) use to check blood sugar four times daily  100 each  11  . insulin aspart (NOVOLOG) 100 UNIT/ML injection Inject into the skin. Sliding scale as needed      . insulin lispro (HUMALOG) 100 UNIT/ML injection 112 units in the morning and 90 at dinner      . Insulin Pen Needle 32G X 6 MM MISC Use twice daily with byetta  100 each  5  . lansoprazole (PREVACID) 30 MG capsule Take one by mouth daily  90 capsule  1  . levothyroxine (SYNTHROID, LEVOTHROID) 150 MCG tablet Take 150 mcg by mouth daily.        Marland Kitchen LORazepam (ATIVAN) 0.5 MG tablet Take 1 tablet (0.5 mg total) by mouth every 8 (eight) hours.  30 tablet  0  . nitroGLYCERIN (NITROLINGUAL) 0.4 MG/SPRAY spray Place 1 spray under the tongue as needed. Chest pain  12 g  3  . traMADol (ULTRAM) 50 MG tablet Take 1 tablet (50 mg total) by mouth every 6 (six) hours as needed for pain.  50 tablet  2  . traZODone (DESYREL) 100 MG tablet TAKE THREE TABLETS BY MOUTH AT BEDTIME  90 tablet  3  . aspirin EC 81 MG tablet Take 1 tablet (81 mg total) by mouth daily.  150 tablet  2   No facility-administered medications prior to visit.       Review of Systems  Constitutional: Negative for fever, chills and unexpected weight change.  HENT: Negative for ear pain, nosebleeds, congestion, sore throat, rhinorrhea, sneezing, trouble swallowing, dental problem, voice change, postnasal drip and sinus pressure.   Eyes: Negative for visual disturbance.  Respiratory: Positive for cough and shortness of breath. Negative for choking.   Cardiovascular: Positive for leg swelling. Negative for chest pain.  Gastrointestinal: Negative for vomiting, abdominal pain and diarrhea.  Genitourinary: Negative for difficulty urinating.  Musculoskeletal:  Negative for arthralgias.  Skin: Negative for rash.  Neurological: Negative for tremors, syncope and headaches.  Hematological: Does not bruise/bleed easily.       Objective:   Physical Exam   Filed  Vitals:   12/12/12 1020  BP: 132/56  Pulse: 65  Temp: 98 F (36.7 C)  TempSrc: Oral  Height: 5\' 6"  (1.676 m)  Weight: 187 lb (84.823 kg)  SpO2: 95%   Filed Vitals:   12/12/12 1020  BP: 132/56  Pulse: 65  Temp: 98 F (36.7 C)  TempSrc: Oral  Height: 5\' 6"  (1.676 m)  Weight: 187 lb (84.823 kg)  SpO2: 95%    Gen: Pleasant, well-nourished, in no distress,  normal affect  ENT: No lesions,  mouth clear,  oropharynx clear,                ++ postnasal drip  Neck: No JVD, no TMG, no carotid bruits  Lungs: No use of accessory muscles, no dullness to percussion, clear without rales or rhonchi, prom pseudowheeze  Cardiovascular: RRR, heart sounds normal, no murmur or gallops, no peripheral edema  Abdomen: soft and NT, no HSM,  BS normal  Musculoskeletal: No deformities, no cyanosis or clubbing  Neuro: alert, non focal  Skin: Warm, no lesions or rashes  No results found.  Cleda Daub 12/12/12: normal     Assessment & Plan:   Extrinsic asthma ?asthma hx with normal spiro and evidence for upper airway instablity and severe GERD with gastroparesis Pt intolerant of inhalers RAST assay positive  Plan No inhalers are indicated Take metaclopramide 10mg  at bedtime for 10days then STOP  (stop early at any signs of increase shakiness or jerking movements) Take pepcid 20mg  at bedtime Take prevacid twice daily No antibiotics needed A referral to sleep medicine will be made Return will be with Dr Kendrick Fries in [place]     Updated Medication List Outpatient Encounter Prescriptions as of 12/12/2012  Medication Sig Dispense Refill  . Calcium-Vitamin D (CALTRATE 600 PLUS-VIT D PO) Take 1 tablet by mouth 2 (two) times daily.       Marland Kitchen FLUoxetine (PROZAC) 40 MG capsule TAKE 1 CAPSULE  TWICE DAILY  180 capsule  0  . glucose blood test strip (OneTouch Ultra test strips) use to check blood sugar four times daily  100 each  11  . insulin aspart (NOVOLOG) 100 UNIT/ML injection Inject into the skin. Sliding scale as needed      . insulin lispro (HUMALOG) 100 UNIT/ML injection 112 units in the morning and 90 at dinner      . Insulin Pen Needle 32G X 6 MM MISC Use twice daily with byetta  100 each  5  . lansoprazole (PREVACID) 30 MG capsule Take one by mouth before meal twice daily  180 capsule  6  . levothyroxine (SYNTHROID, LEVOTHROID) 150 MCG tablet Take 150 mcg by mouth daily.        Marland Kitchen LORazepam (ATIVAN) 0.5 MG tablet Take 1 tablet (0.5 mg total) by mouth every 8 (eight) hours.  30 tablet  0  . nitroGLYCERIN (NITROLINGUAL) 0.4 MG/SPRAY spray Place 1 spray under the tongue as needed. Chest pain  12 g  3  . traMADol (ULTRAM) 50 MG tablet Take 1 tablet (50 mg total) by mouth every 6 (six) hours as needed for pain.  50 tablet  2  . traZODone (DESYREL) 100 MG tablet Take 200 mg by mouth at bedtime.       . [DISCONTINUED] lansoprazole (PREVACID) 30 MG capsule Take one by mouth daily  90 capsule  1  . [DISCONTINUED] traZODone (DESYREL) 100 MG tablet TAKE THREE TABLETS BY MOUTH AT BEDTIME  90 tablet  3  . famotidine (PEPCID) 20 MG  tablet Take 1 tablet (20 mg total) by mouth at bedtime.  30 tablet  6  . metoCLOPramide (REGLAN) 10 MG tablet Take 1 tablet (10 mg total) by mouth at bedtime.  20 tablet  0  . [DISCONTINUED] aspirin EC 81 MG tablet Take 1 tablet (81 mg total) by mouth daily.  150 tablet  2  . [DISCONTINUED] aspirin EC 81 MG tablet ON HOLD       No facility-administered encounter medications on file as of 12/12/2012.

## 2012-12-12 NOTE — Patient Instructions (Addendum)
No inhalers are indicated Take metaclopramide 10mg  at bedtime for 10days then STOP  (stop early at any signs of increase shakiness or jerking movements) Take pepcid 20mg  at bedtime Take prevacid twice daily No antibiotics needed A referral to sleep medicine will be made Return will be with Dr Kendrick Fries in [place]

## 2012-12-13 NOTE — Assessment & Plan Note (Signed)
Moderate obstructive sleep apnea untreated. Plan Refer to sleep medicine Craige Cotta

## 2012-12-13 NOTE — Assessment & Plan Note (Signed)
?  asthma hx with normal spiro and evidence for upper airway instablity and severe GERD with gastroparesis Pt intolerant of inhalers RAST assay positive  Plan No inhalers are indicated Take metaclopramide 10mg  at bedtime for 10days then STOP  (stop early at any signs of increase shakiness or jerking movements) Take pepcid 20mg  at bedtime Take prevacid twice daily No antibiotics needed A referral to sleep medicine will be made Return will be with Dr Kendrick Fries in [place]

## 2012-12-17 ENCOUNTER — Other Ambulatory Visit: Payer: Self-pay | Admitting: *Deleted

## 2012-12-17 MED ORDER — LORAZEPAM 0.5 MG PO TABS: 0.5000 mg | ORAL_TABLET | Freq: Three times a day (TID) | ORAL | Status: DC

## 2012-12-17 MED ORDER — TRAMADOL HCL 50 MG PO TABS: 50.0000 mg | ORAL_TABLET | Freq: Four times a day (QID) | ORAL | Status: DC | PRN

## 2012-12-17 NOTE — Telephone Encounter (Signed)
Tramadol last filled 11/23/12 Lorazepam last filled 12/03/12

## 2012-12-17 NOTE — Telephone Encounter (Signed)
Lorazepam called to [facility]

## 2012-12-18 ENCOUNTER — Telehealth: Payer: Self-pay | Admitting: Critical Care Medicine

## 2012-12-18 ENCOUNTER — Telehealth: Payer: Self-pay

## 2012-12-18 MED ORDER — LANSOPRAZOLE 30 MG PO CPDR: DELAYED_RELEASE_CAPSULE | ORAL | Status: DC

## 2012-12-18 NOTE — Telephone Encounter (Signed)
Rx has been sent to [facility]. Pt is aware.

## 2012-12-18 NOTE — Telephone Encounter (Signed)
Mr Savannah said pt was seen by Dr Delford Field on 12/12/12 and pts Lansoprazole was increased to twice a day. Pt said picked up the other 2 meds at [facility] but no Lansoprazole. [facility] is closed for lunch now. Mr Lair said to cancel Lansoprazole rx at [facility] and send to [facility] mail order pharmacy. Advised Dr Lynelle Doctor office would need to do that since med increase and rx was done by Dr Delford Field. Mr. Rinks did not want to make that call; I called Dr Lynelle Doctor office spoke with Nicholos Johns and she will send request to triage nurse and pt will be notified when done by Dr Lynelle Doctor office. Mr Ostrum notified.

## 2012-12-26 DIAGNOSIS — J301 Allergic rhinitis due to pollen: Secondary | ICD-10-CM | POA: Diagnosis not present

## 2012-12-29 DIAGNOSIS — J301 Allergic rhinitis due to pollen: Secondary | ICD-10-CM | POA: Diagnosis not present

## 2012-12-31 ENCOUNTER — Ambulatory Visit (INDEPENDENT_AMBULATORY_CARE_PROVIDER_SITE_OTHER)
Admission: RE | Admit: 2012-12-31 | Discharge: 2012-12-31 | Disposition: A | Discharge status: Home / Self Care | Payer: Medicare Other | Source: Ambulatory Visit | Attending: Family Medicine | Admitting: Family Medicine

## 2012-12-31 ENCOUNTER — Ambulatory Visit (INDEPENDENT_AMBULATORY_CARE_PROVIDER_SITE_OTHER): Payer: Medicare Other | Admitting: Family Medicine

## 2012-12-31 ENCOUNTER — Encounter: Payer: Self-pay | Admitting: Family Medicine

## 2012-12-31 VITALS — BP 128/66 | HR 66 | Temp 98.0°F | Wt 189.2 lb

## 2012-12-31 DIAGNOSIS — M545 Low back pain: Secondary | ICD-10-CM

## 2012-12-31 DIAGNOSIS — M5137 Other intervertebral disc degeneration, lumbosacral region: Secondary | ICD-10-CM | POA: Diagnosis not present

## 2012-12-31 MED ORDER — DICLOFENAC SODIUM 75 MG PO TBEC: 75.0000 mg | DELAYED_RELEASE_TABLET | Freq: Two times a day (BID) | ORAL | Status: DC

## 2012-12-31 MED ORDER — LORAZEPAM 1 MG PO TABS: 1.0000 mg | ORAL_TABLET | Freq: Two times a day (BID) | ORAL | Status: DC | PRN

## 2012-12-31 MED ORDER — DIAZEPAM 5 MG PO TABS: 5.0000 mg | ORAL_TABLET | Freq: Two times a day (BID) | ORAL | Status: DC | PRN

## 2012-12-31 NOTE — Progress Notes (Signed)
Nature conservation officer at [facility] [address] [place] [address] Phone: [phone] Fax: [phone]  Date:  12/31/2012   Name:  [name]   DOB:  [birth year]   MRN:  [id number] Gender: female Age: 67 y.o.  Primary Physician:  Ruthe Mannan, MD  Evaluating MD: Hannah Beat, MD   Chief Complaint: Back Pain   History of Present Illness:  [name] is a 67 y.o. pleasant patient who presents with the following:  Acute back pain: 10 days ago and into her groin. Has been in agony for 8 days.  1980's --- could not walk for 6 weeks.   R leg the other day down, but this has not been a persistent symptom. No trauma or fall. She has some back back and took 250 mg of Ultram in one dose, and she became nauseated. I assured her this is not an allergy, but a normal reaction to 3 times the maximal dose.   No bowel or bladder incontinence.    Patient Active Problem List   Diagnosis Date Noted  . Dyspnea 11/17/2012  . Weakness 11/17/2012  . Abnormal MRI of head 09/05/2012  . Depression with anxiety 03/21/2012  . Extrinsic asthma 11/12/2011  . Anemia 06/07/2011  . PULMONARY NODULE, SOLITARY 01/25/2010  . UNSPECIFIED HYPOTHYROIDISM 08/10/2009  . MICROSCOPIC HEMATURIA 05/07/2009  . GOITER 03/02/2009  . HYPERTENSION, BENIGN 01/06/2009  . MEMORY LOSS 06/23/2008  . OBSTRUCTIVE SLEEP APNEA 12/25/2007  . CORONARY ARTERY DISEASE 11/11/2007  . DIABETES MELLITUS, TYPE II 09/09/2006  . HYPERCHOLESTEROLEMIA 09/09/2006  . ALLERGIC RHINITIS 09/09/2006  . GERD 09/09/2006  . DIVERTICULOSIS, COLON 09/09/2006    Past Medical History  Diagnosis Date  . Allergic rhinitis   . Diabetes mellitus     type 2  . Diverticulitis of colon   . GERD (gastroesophageal reflux disease)   . Chronic depression   . Hypercholesterolemia     intolerance of statins and niaspan  . Apnea, sleep     mild, intolerant of cpap  . Coronary artery disease   . ACE-inhibitor cough   . Cataract   .  Chronic headache   . Asthma, acute   . Hearing loss     Past Surgical History  Procedure Laterality Date  . Cholecystectomy  2001  . Colectomy      lap sigmoid  . Tonsillectomy    . Bartholin gland cyst excision    . Tubal ligation    . Bladder suspension    . Breast cyst excision  1988    bilateral nonmalignant tumors, x3  . Vaginal delivery      3  . Thyroidectomy    . Abdominal hysterectomy w/ partial vaginactomy    . Appendectomy  1964  . Cardiac catheterization    . Carotid stent      History   Social History  . Marital Status: Married    Spouse Name: N/A    Number of Children: 3  . Years of Education: N/A   Occupational History  . Retired     Lawyer   Social History Main Topics  . Smoking status: Former Smoker -- 1.00 packs/day for 30 years    Types: Cigarettes    Quit date: 02/03/2008  . Smokeless tobacco: Never Used  . Alcohol Use: No  . Drug Use: No  . Sexually Active: Not on file   Other Topics Concern  . Not on file   Social History Narrative  . No narrative  on file    Family History  Problem Relation Age of Onset  . Breast cancer Mother     breast  . Hypertension Father   . Cancer Father     mesothelioma  . Asthma Father   . Stroke Paternal Grandfather   . Heart disease      Allergies  Allergen Reactions  . Budesonide-Formoterol Fumarate     Shakiness, tremors  . Atorvastatin   . Bupropion Hcl   . Codeine Sulfate   . Gabapentin   . Latex   . Lisinopril     REACTION: cough  . Meperidine Hcl   . Metformin     REACTION: unspecified  . Mirtazapine   . Mometasone Furoate     REACTION: Nause and vomiting  . Morphine Sulfate     REACTION: Chest pain  . Olanzapine   . Other     Beta Blockers, reaction unknown but listed  . Oxycodone-Acetaminophen   . Propoxyphene-Acetaminophen   . Rosuvastatin     REACTION: Makes patient weak.  . Shellfish Allergy   . Venlafaxine     REACTION: unspecified  . Zolpidem Tartrate      REACTION: Jittery, diarrhea    Medication list has been reviewed and updated.  Outpatient Prescriptions Prior to Visit  Medication Sig Dispense Refill  . Calcium-Vitamin D (CALTRATE 600 PLUS-VIT D PO) Take 1 tablet by mouth 2 (two) times daily.       . famotidine (PEPCID) 20 MG tablet Take 1 tablet (20 mg total) by mouth at bedtime.  30 tablet  6  . FLUoxetine (PROZAC) 40 MG capsule TAKE 1 CAPSULE TWICE DAILY  180 capsule  0  . glucose blood test strip (OneTouch Ultra test strips) use to check blood sugar four times daily  100 each  11  . insulin aspart (NOVOLOG) 100 UNIT/ML injection Inject into the skin. Sliding scale as needed      . insulin lispro (HUMALOG) 100 UNIT/ML injection 112 units in the morning and 90 at dinner      . Insulin Pen Needle 32G X 6 MM MISC Use twice daily with byetta  100 each  5  . lansoprazole (PREVACID) 30 MG capsule Take one by mouth before meal twice daily  180 capsule  3  . levothyroxine (SYNTHROID, LEVOTHROID) 150 MCG tablet Take 150 mcg by mouth daily.        Marland Kitchen LORazepam (ATIVAN) 0.5 MG tablet Take 1 tablet (0.5 mg total) by mouth every 8 (eight) hours.  30 tablet  0  . metoCLOPramide (REGLAN) 10 MG tablet Take 1 tablet (10 mg total) by mouth at bedtime.  20 tablet  0  . nitroGLYCERIN (NITROLINGUAL) 0.4 MG/SPRAY spray Place 1 spray under the tongue as needed. Chest pain  12 g  3  . traMADol (ULTRAM) 50 MG tablet Take 1 tablet (50 mg total) by mouth every 6 (six) hours as needed for pain.  50 tablet  2  . traZODone (DESYREL) 100 MG tablet Take 200 mg by mouth at bedtime.        No facility-administered medications prior to visit.    Review of Systems:   GEN: No fevers, chills. Nontoxic. Primarily MSK c/o today. MSK: Detailed in the HPI GI: tolerating PO intake without difficulty Neuro: detailed above Otherwise the pertinent positives of the ROS are noted above.    Physical Examination: BP 128/66  Pulse 66  Temp(Src) 98 F (36.7 C) (Oral)  Wt 189  lb 4 oz (  85.843 kg)  BMI 30.56 kg/m2  SpO2 6%  Ideal Body Weight:     GEN: Well-developed,well-nourished,in no acute distress; alert,appropriate and cooperative throughout examination HEENT: Normocephalic and atraumatic without obvious abnormalities. Ears, externally no deformities PULM: Breathing comfortably in no respiratory distress EXT: No clubbing, cyanosis, or edema PSYCH: Normally interactive. Cooperative during the interview. Pleasant. Friendly and conversant. Not anxious or depressed appearing. Normal, full affect.  Range of motion at  the waist: Flexion, extension, lateral bending and rotation: flexion to 40, minimal ext, limited lateral bending  No echymosis or edema Rises to examination table with mild difficulty Gait: minimally antalgic  Inspection/Deformity: N Paraspinus Tenderness: diffuse l3-s1  B Ankle Dorsiflexion (L5,4): 5/5 B Great Toe Dorsiflexion (L5,4): 5/5 Heel Walk (L5): WNL Toe Walk (S1): WNL Rise/Squat (L4): WNL, mild pain  SENSORY B Medial Foot (L4): WNL B Dorsum (L5): WNL B Lateral (S1): WNL Light Touch: WNL Pinprick: WNL  REFLEXES Knee (L4): 2+ Ankle (S1): 2+  B SLR, seated: neg B SLR, supine: neg B FABER: neg B Greater Troch: NT B Log Roll: neg B Sciatic Notch: mild ttp  Dg Lumbar Spine Complete  12/31/2012   *RADIOLOGY REPORT*  Clinical Data: 67 year old female with low back pain.  LUMBAR SPINE - COMPLETE 4+ VIEW  Comparison: None  Findings: Normal alignment is noted. There is no evidence of acute fracture or subluxation. Moderate degenerative disc disease at L4-L5 and L5-S1 noted. No focal bony lesions or spondylolysis noted.  IMPRESSION: Moderate degenerative disc disease at L4-L5 and L5-S1.  No evidence of acute abnormality.   Original Report Authenticated By: Harmon Pier, M.D.    Assessment and Plan:  Low back pain - Plan: DG Lumbar Spine Complete  Mechanical lbp from ddd with potential disk herniation Basic rehab Medication  intolerances limit traditional algorithm voltaren and ativan 1 mg scheduled for pain and muscle relaxant Intolerant of all opioids, multiple muscle relaxers, benzos  Orders Today:  Orders Placed This Encounter  Procedures  . DG Lumbar Spine Complete    Standing Status: Future     Number of Occurrences: 1     Standing Expiration Date: 03/02/2014    Order Specific Question:  Preferred imaging location?    Answer:  [facility]    Order Specific Question:  Reason for exam:    Answer:  back pain    Updated Medication List: (Includes new medications, updates to list, dose adjustments) Meds ordered this encounter  Medications  . DISCONTD: diazepam (VALIUM) 5 MG tablet    Sig: Take 1 tablet (5 mg total) by mouth every 12 (twelve) hours as needed.    Dispense:  30 tablet    Refill:  0  . DISCONTD: diclofenac (VOLTAREN) 75 MG EC tablet    Sig: Take 1 tablet (75 mg total) by mouth 2 (two) times daily.    Dispense:  60 tablet    Refill:  2  . LORazepam (ATIVAN) 1 MG tablet    Sig: Take 1 tablet (1 mg total) by mouth 2 (two) times daily as needed for anxiety.    Dispense:  60 tablet    Refill:  0  . diclofenac (VOLTAREN) 75 MG EC tablet    Sig: Take 1 tablet (75 mg total) by mouth 2 (two) times daily.    Dispense:  60 tablet    Refill:  2    Medications Discontinued: Medications Discontinued During This Encounter  Medication Reason  . diclofenac (VOLTAREN) 75 MG EC tablet Reorder  .  diazepam (VALIUM) 5 MG tablet       Signed, Daron Stutz T. Alysah Carton, MD 12/31/2012 3:45 PM

## 2013-01-02 ENCOUNTER — Ambulatory Visit: Payer: Self-pay | Admitting: Internal Medicine

## 2013-01-05 ENCOUNTER — Encounter: Payer: Self-pay | Admitting: Radiology

## 2013-01-06 ENCOUNTER — Ambulatory Visit (INDEPENDENT_AMBULATORY_CARE_PROVIDER_SITE_OTHER): Payer: Medicare Other | Admitting: Family Medicine

## 2013-01-06 ENCOUNTER — Encounter: Payer: Self-pay | Admitting: Family Medicine

## 2013-01-06 VITALS — BP 140/68 | HR 76 | Temp 98.0°F | Ht 66.0 in | Wt 188.0 lb

## 2013-01-06 DIAGNOSIS — M545 Low back pain: Secondary | ICD-10-CM | POA: Diagnosis not present

## 2013-01-06 NOTE — Progress Notes (Signed)
[name] is a 67 y.o. pleasant patient who presents with the following:  Follow up back pain.  Saw Dr. Patsy Lager on 7/30 for acute onset of back pain 8 days prior to seeing him.  Acute back pain:  No trauma or fall.  No bowel or bladder incontinence.   Lumbar xray showed moderate degenerative disc disease at L4-L5 and L5-S1.  Dg Lumbar Spine Complete  12/31/2012   *RADIOLOGY REPORT*  Clinical Data: 67 year old female with low back pain.  LUMBAR SPINE - COMPLETE 4+ VIEW  Comparison: None  Findings: Normal alignment is noted. There is no evidence of acute fracture or subluxation. Moderate degenerative disc disease at L4-L5 and L5-S1 noted. No focal bony lesions or spondylolysis noted.  IMPRESSION: Moderate degenerative disc disease at L4-L5 and L5-S1.  No evidence of acute abnormality.   Original Report Authenticated By: Harmon Pier, M.D.    Dr. Patsy Lager felt she had mechanical low back pain with ddd with potential disk herniation.  Given multiple drug intolerances, given voltaren gel and ativan. Unfortunately was allergic to both of them- Voltaren caused SOB, higher dose of lorazepam caused double vision.  She can tolerate 0.5 mg of Lorezepam. Also advised back rehab.    She is taking Tramadol and lorazepam as needed.  Feels back has improved- pain is now a 2/10.  She feels it was a 10/10 when she saw Dr. Patsy Lager.     Patient Active Problem List   Diagnosis Date Noted  . Low back pain 01/06/2013  . Dyspnea 11/17/2012  . Weakness 11/17/2012  . Abnormal MRI of head 09/05/2012  . Depression with anxiety 03/21/2012  . Extrinsic asthma 11/12/2011  . Anemia 06/07/2011  . PULMONARY NODULE, SOLITARY 01/25/2010  . UNSPECIFIED HYPOTHYROIDISM 08/10/2009  . MICROSCOPIC HEMATURIA 05/07/2009  . GOITER 03/02/2009  . HYPERTENSION, BENIGN 01/06/2009  . MEMORY LOSS 06/23/2008  . OBSTRUCTIVE SLEEP APNEA 12/25/2007  . CORONARY ARTERY DISEASE 11/11/2007  . DIABETES MELLITUS, TYPE II 09/09/2006  .  HYPERCHOLESTEROLEMIA 09/09/2006  . ALLERGIC RHINITIS 09/09/2006  . GERD 09/09/2006  . DIVERTICULOSIS, COLON 09/09/2006    Past Medical History  Diagnosis Date  . Allergic rhinitis   . Diabetes mellitus     type 2  . Diverticulitis of colon   . GERD (gastroesophageal reflux disease)   . Chronic depression   . Hypercholesterolemia     intolerance of statins and niaspan  . Apnea, sleep     mild, intolerant of cpap  . Coronary artery disease   . ACE-inhibitor cough   . Cataract   . Chronic headache   . Asthma, acute   . Hearing loss     Past Surgical History  Procedure Laterality Date  . Cholecystectomy  2001  . Colectomy      lap sigmoid  . Tonsillectomy    . Bartholin gland cyst excision    . Tubal ligation    . Bladder suspension    . Breast cyst excision  1988    bilateral nonmalignant tumors, x3  . Vaginal delivery      3  . Thyroidectomy    . Abdominal hysterectomy w/ partial vaginactomy    . Appendectomy  1964  . Cardiac catheterization    . Carotid stent      History   Social History  . Marital Status: Married    Spouse Name: N/A    Number of Children: 3  . Years of Education: N/A   Occupational History  . Retired  CNA   Social History Main Topics  . Smoking status: Former Smoker -- 1.00 packs/day for 30 years    Types: Cigarettes    Quit date: 02/03/2008  . Smokeless tobacco: Never Used  . Alcohol Use: No  . Drug Use: No  . Sexually Active: Not on file   Other Topics Concern  . Not on file   Social History Narrative  . No narrative on file    Family History  Problem Relation Age of Onset  . Breast cancer Mother     breast  . Hypertension Father   . Cancer Father     mesothelioma  . Asthma Father   . Stroke Paternal Grandfather   . Heart disease      Allergies  Allergen Reactions  . Budesonide-Formoterol Fumarate     Shakiness, tremors  . Atorvastatin   . Bupropion Hcl   . Codeine Sulfate   . Gabapentin   . Latex    . Lisinopril     REACTION: cough  . Meperidine Hcl   . Metformin     REACTION: unspecified  . Mirtazapine   . Mometasone Furoate     REACTION: Nause and vomiting  . Morphine Sulfate     REACTION: Chest pain  . Olanzapine   . Other     Beta Blockers, reaction unknown but listed  . Oxycodone-Acetaminophen   . Propoxyphene-Acetaminophen   . Rosuvastatin     REACTION: Makes patient weak.  . Shellfish Allergy   . Venlafaxine     REACTION: unspecified  . Zolpidem Tartrate     REACTION: Jittery, diarrhea    Medication list has been reviewed and updated.  Outpatient Prescriptions Prior to Visit  Medication Sig Dispense Refill  . Calcium-Vitamin D (CALTRATE 600 PLUS-VIT D PO) Take 1 tablet by mouth 2 (two) times daily.       . diclofenac (VOLTAREN) 75 MG EC tablet Take 1 tablet (75 mg total) by mouth 2 (two) times daily.  60 tablet  2  . famotidine (PEPCID) 20 MG tablet Take 1 tablet (20 mg total) by mouth at bedtime.  30 tablet  6  . FLUoxetine (PROZAC) 40 MG capsule TAKE 1 CAPSULE TWICE DAILY  180 capsule  0  . glucose blood test strip (OneTouch Ultra test strips) use to check blood sugar four times daily  100 each  11  . insulin aspart (NOVOLOG) 100 UNIT/ML injection Inject into the skin. Sliding scale as needed      . insulin lispro (HUMALOG) 100 UNIT/ML injection 112 units in the morning and 90 at dinner      . Insulin Pen Needle 32G X 6 MM MISC Use twice daily with byetta  100 each  5  . lansoprazole (PREVACID) 30 MG capsule Take one by mouth before meal twice daily  180 capsule  3  . levothyroxine (SYNTHROID, LEVOTHROID) 150 MCG tablet Take 150 mcg by mouth daily.        Marland Kitchen LORazepam (ATIVAN) 0.5 MG tablet Take 1 tablet (0.5 mg total) by mouth every 8 (eight) hours.  30 tablet  0  . LORazepam (ATIVAN) 1 MG tablet Take 1 tablet (1 mg total) by mouth 2 (two) times daily as needed for anxiety.  60 tablet  0  . metoCLOPramide (REGLAN) 10 MG tablet Take 1 tablet (10 mg total) by  mouth at bedtime.  20 tablet  0  . nitroGLYCERIN (NITROLINGUAL) 0.4 MG/SPRAY spray Place 1 spray under the tongue as needed. Chest  pain  12 g  3  . traMADol (ULTRAM) 50 MG tablet Take 1 tablet (50 mg total) by mouth every 6 (six) hours as needed for pain.  50 tablet  2  . traZODone (DESYREL) 100 MG tablet Take 200 mg by mouth at bedtime.        No facility-administered medications prior to visit.    Review of Systems: See HPI  GEN: No fevers, chills. Nontoxic. Primarily MSK c/o today.   Physical Examination: BP 140/68  Pulse 76  Temp(Src) 98 F (36.7 C)  Ht 5\' 6"  (1.676 m)  Wt 188 lb (85.276 kg)  BMI 30.36 kg/m2  GEN: Well-developed,well-nourished,in no acute distress; alert,appropriate and cooperative throughout examination HEENT: Normocephalic and atraumatic without obvious abnormalities. Ears, externally no deformities PULM: Breathing comfortably in no respiratory distress EXT: No clubbing, cyanosis, or edema Normal gait PSYCH: Normally interactive. Cooperative during the interview. Pleasant. Friendly and conversant. Not anxious or depressed appearing.  Flat affect (baseline) MSK:  No TTP over spine, FROM    Assessment and Plan:  1. Low back pain Improving.  She wants to wean off Tramadol.  Advised pt that if symptoms persist off tramadol, we should try a course of prednisone. The patient indicates understanding of these issues and agrees with the plan. Call or return to clinic prn if these symptoms worsen or fail to improve as anticipated.

## 2013-01-06 NOTE — Patient Instructions (Addendum)
Good to see you. Please let me know how you are feeling once you wean off of the Tramadol and Ativan.

## 2013-01-07 ENCOUNTER — Ambulatory Visit: Payer: Self-pay | Admitting: Internal Medicine

## 2013-01-07 ENCOUNTER — Telehealth: Payer: Self-pay

## 2013-01-07 DIAGNOSIS — I2510 Atherosclerotic heart disease of native coronary artery without angina pectoris: Secondary | ICD-10-CM | POA: Diagnosis not present

## 2013-01-07 DIAGNOSIS — K219 Gastro-esophageal reflux disease without esophagitis: Secondary | ICD-10-CM | POA: Diagnosis not present

## 2013-01-07 DIAGNOSIS — E039 Hypothyroidism, unspecified: Secondary | ICD-10-CM | POA: Diagnosis not present

## 2013-01-07 DIAGNOSIS — E785 Hyperlipidemia, unspecified: Secondary | ICD-10-CM | POA: Diagnosis not present

## 2013-01-07 DIAGNOSIS — F329 Major depressive disorder, single episode, unspecified: Secondary | ICD-10-CM | POA: Diagnosis not present

## 2013-01-07 DIAGNOSIS — Z794 Long term (current) use of insulin: Secondary | ICD-10-CM | POA: Diagnosis not present

## 2013-01-07 DIAGNOSIS — E119 Type 2 diabetes mellitus without complications: Secondary | ICD-10-CM | POA: Diagnosis not present

## 2013-01-07 DIAGNOSIS — I10 Essential (primary) hypertension: Secondary | ICD-10-CM | POA: Diagnosis not present

## 2013-01-07 DIAGNOSIS — D509 Iron deficiency anemia, unspecified: Secondary | ICD-10-CM | POA: Diagnosis not present

## 2013-01-07 DIAGNOSIS — Z79899 Other long term (current) drug therapy: Secondary | ICD-10-CM | POA: Diagnosis not present

## 2013-01-07 LAB — CBC CANCER CENTER
Basophil #: 0.1 x10 3/mm (ref 0.0–0.1)
Lymphocyte %: 20.5 %
MCH: 30.9 pg (ref 26.0–34.0)
MCHC: 35 g/dL (ref 32.0–36.0)
Monocyte #: 0.6 x10 3/mm (ref 0.2–0.9)
Neutrophil #: 5.8 x10 3/mm (ref 1.4–6.5)
Platelet: 147 x10 3/mm — ABNORMAL LOW (ref 150–440)
RBC: 4.09 10*6/uL (ref 3.80–5.20)
RDW: 15 % — ABNORMAL HIGH (ref 11.5–14.5)
WBC: 8.4 x10 3/mm (ref 3.6–11.0)

## 2013-01-07 LAB — FERRITIN: Ferritin (ARMC): 879 ng/mL — ABNORMAL HIGH (ref 8–388)

## 2013-01-07 LAB — IRON AND TIBC: Unbound Iron-Bind.Cap.: 190 ug/dL

## 2013-01-07 MED ORDER — PREDNISONE 10 MG PO TABS: ORAL_TABLET | ORAL | Status: DC

## 2013-01-07 NOTE — Telephone Encounter (Signed)
Left voicemail letting pt know Rx sent to pharmacy and Dr. Royden Purl comments

## 2013-01-07 NOTE — Telephone Encounter (Signed)
I will send prednisone to [facility]  Let her know it may cause her to be hungry/ hyper/ nervous or have trouble sleeping-these are common side effects that will improve off of it

## 2013-01-07 NOTE — Telephone Encounter (Signed)
Pt saw Dr Dayton Martes on 01/06/13 and stopped Tramadol last night; since 3 AM pt has lower back pain on rt side that radiates into rt hip. Pt request Prednisone as Dr Dayton Martes discussed to [facility]. Pain level now 7-8.Please advise.

## 2013-01-08 DIAGNOSIS — Z79899 Other long term (current) drug therapy: Secondary | ICD-10-CM | POA: Diagnosis not present

## 2013-01-19 ENCOUNTER — Ambulatory Visit (INDEPENDENT_AMBULATORY_CARE_PROVIDER_SITE_OTHER): Payer: Medicare Other | Admitting: General Surgery

## 2013-01-19 ENCOUNTER — Encounter: Payer: Self-pay | Admitting: General Surgery

## 2013-01-19 VITALS — BP 130/60 | HR 68 | Resp 14 | Ht 66.0 in | Wt 189.0 lb

## 2013-01-19 DIAGNOSIS — K219 Gastro-esophageal reflux disease without esophagitis: Secondary | ICD-10-CM

## 2013-01-19 DIAGNOSIS — R1310 Dysphagia, unspecified: Secondary | ICD-10-CM | POA: Insufficient documentation

## 2013-01-19 NOTE — Patient Instructions (Addendum)
Upper GI Endoscopy Upper GI endoscopy means using a flexible scope to look at the esophagus, stomach, and upper small bowel. This is done to make a diagnosis in people with heartburn, abdominal pain, or abnormal bleeding. Sometimes an endoscope is needed to remove foreign bodies or food that become stuck in the esophagus; it can also be used to take biopsy samples. For the best results, do not eat or drink for 8 hours before having your upper endoscopy.  To perform the endoscopy, you will probably be sedated and your throat will be numbed with a special spray. The endoscope is then slowly passed down your throat (this will not interfere with your breathing). An endoscopy exam takes 15 to 30 minutes to complete and there is no real pain. Patients rarely remember much about the procedure. The results of the test may take several days if a biopsy or other test is taken.  You may have a sore throat after an endoscopy exam. Serious complications are very rare. Stick to liquids and soft foods until your pain is better. Do not drive a car or operate any dangerous equipment for at least 24 hours after being sedated. SEEK IMMEDIATE MEDICAL CARE IF:   You have severe throat pain.   You have shortness of breath.   You have bleeding problems.   You have a fever.   You have difficulty recovering from your sedation.  Document Released: 06/28/2004 Document Revised: 05/10/2011 Document Reviewed: 05/23/2008 [facility] Patient Information 2012 [place], [place].  Patient has been scheduled for an upper endoscopy on 01-28-13 at [facility].

## 2013-01-19 NOTE — Progress Notes (Signed)
Patient ID: [name], female   DOB: [birth year], 67 y.o.   MRN: [id number]  Chief Complaint  Patient presents with  . Other    Esophageal narrowing    HPI [name] is a 67 y.o. female here for possible esophageal narrowing. When she was last at the Pulmonologist he said that she had narrowing of the esophagus. She reports trouble with swallowing food and pills for at least 3 months. She reports she has vomited up the food and pills at times. She reports a history of reflux since her 65's. She has been seen here in past for breast problems and has had multiple biopsies. HPI  Past Medical History  Diagnosis Date  . Allergic rhinitis   . Diabetes mellitus     type 2  . Diverticulitis of colon   . GERD (gastroesophageal reflux disease)   . Chronic depression   . Hypercholesterolemia     intolerance of statins and niaspan  . Apnea, sleep     mild, intolerant of cpap  . Coronary artery disease   . ACE-inhibitor cough   . Cataract   . Chronic headache   . Asthma, acute   . Hearing loss   . History of blood transfusion 2013    Past Surgical History  Procedure Laterality Date  . Cholecystectomy  2001  . Colectomy      lap sigmoid  . Tonsillectomy    . Bartholin gland cyst excision    . Tubal ligation    . Bladder suspension    . Breast cyst excision  1988    bilateral nonmalignant tumors, x3  . Vaginal delivery      3  . Thyroidectomy    . Abdominal hysterectomy w/ partial vaginactomy    . Appendectomy  1964  . Cardiac catheterization    . Carotid stent    . Colonoscopy  2014    polyps found, 2 clamped off.    Family History  Problem Relation Age of Onset  . Breast cancer Mother     breast  . Hypertension Father   . Cancer Father     mesothelioma  . Asthma Father   . Stroke Paternal Grandfather   . Heart disease      Social History History  Substance Use Topics  . Smoking status: Former Smoker -- 1.00 packs/day for 30 years    Types: Cigarettes   Quit date: 02/03/2008  . Smokeless tobacco: Never Used  . Alcohol Use: No    Allergies  Allergen Reactions  . Voltaren [Diclofenac Sodium] Shortness Of Breath  . Budesonide-Formoterol Fumarate     Shakiness, tremors  . Atorvastatin   . Bupropion Hcl   . Codeine Sulfate   . Gabapentin   . Latex   . Lisinopril     REACTION: cough  . Lorazepam      Causes double vision at highter than .5 mg dose  . Meperidine Hcl   . Metformin     REACTION: unspecified  . Mirtazapine   . Mometasone Furoate     REACTION: Nause and vomiting  . Morphine Sulfate     REACTION: Chest pain  . Olanzapine   . Other     Beta Blockers, reaction unknown but listed  . Oxycodone-Acetaminophen   . Propoxyphene-Acetaminophen   . Rosuvastatin     REACTION: Makes patient weak.  . Shellfish Allergy   . Venlafaxine     REACTION: unspecified  . Zolpidem Tartrate  REACTION: Jittery, diarrhea    Current Outpatient Prescriptions  Medication Sig Dispense Refill  . Calcium-Vitamin D (CALTRATE 600 PLUS-VIT D PO) Take 1 tablet by mouth 2 (two) times daily.       . famotidine (PEPCID) 20 MG tablet Take 1 tablet (20 mg total) by mouth at bedtime.  30 tablet  6  . FLUoxetine (PROZAC) 40 MG capsule TAKE 1 CAPSULE TWICE DAILY  180 capsule  0  . glucose blood test strip (OneTouch Ultra test strips) use to check blood sugar four times daily  100 each  11  . insulin aspart (NOVOLOG) 100 UNIT/ML injection Inject into the skin. Sliding scale as needed      . insulin lispro (HUMALOG) 100 UNIT/ML injection 112 units in the morning and 90 at dinner      . Insulin Pen Needle 32G X 6 MM MISC Use twice daily with byetta  100 each  5  . lansoprazole (PREVACID) 30 MG capsule Take one by mouth before meal twice daily  180 capsule  3  . levothyroxine (SYNTHROID, LEVOTHROID) 150 MCG tablet Take 150 mcg by mouth daily.        Marland Kitchen LORazepam (ATIVAN) 0.5 MG tablet Take 0.5 mg by mouth every 8 (eight) hours.      . nitroGLYCERIN  (NITROLINGUAL) 0.4 MG/SPRAY spray Place 1 spray under the tongue as needed. Chest pain  12 g  3  . traMADol (ULTRAM) 50 MG tablet Take 1 tablet (50 mg total) by mouth every 6 (six) hours as needed for pain.  50 tablet  2  . traZODone (DESYREL) 100 MG tablet Take 200 mg by mouth at bedtime.        No current facility-administered medications for this visit.    Review of Systems Review of Systems  Constitutional: Negative.   Respiratory: Negative.   Cardiovascular: Negative.     Blood pressure 130/60, pulse 68, resp. rate 14, height 5\' 6"  (1.676 m), weight 189 lb (85.73 kg).  Physical Exam Physical Exam  Constitutional: She is oriented to person, place, and time. She appears well-developed and well-nourished.  HENT:  Mouth/Throat: Oropharynx is clear and moist and mucous membranes are normal.  Eyes: Conjunctivae are normal. No scleral icterus.  Neck: Neck supple.  Cardiovascular: Normal rate, regular rhythm and normal heart sounds.   Pulmonary/Chest: Effort normal and breath sounds normal.  Lymphadenopathy:    She has no cervical adenopathy.  Neurological: She is alert and oriented to person, place, and time.    Data Reviewed    Assessment    History of GERD, now with swallowing difficulties     Plan     Feel she needs an upper endoscopy to assess for esophagitis/stenosis. Discussed with pt and she is agreeable.     Patient has been scheduled for an upper endoscopy on 01-28-13 at [facility].   SANKAR,SEEPLAPUTHUR G 01/21/2013, 6:31 AM

## 2013-01-21 ENCOUNTER — Encounter: Payer: Self-pay | Admitting: General Surgery

## 2013-01-28 ENCOUNTER — Ambulatory Visit: Payer: Self-pay | Admitting: General Surgery

## 2013-01-28 DIAGNOSIS — H9190 Unspecified hearing loss, unspecified ear: Secondary | ICD-10-CM | POA: Diagnosis not present

## 2013-01-28 DIAGNOSIS — Z91013 Allergy to seafood: Secondary | ICD-10-CM | POA: Diagnosis not present

## 2013-01-28 DIAGNOSIS — J45909 Unspecified asthma, uncomplicated: Secondary | ICD-10-CM | POA: Diagnosis not present

## 2013-01-28 DIAGNOSIS — G43909 Migraine, unspecified, not intractable, without status migrainosus: Secondary | ICD-10-CM | POA: Diagnosis not present

## 2013-01-28 DIAGNOSIS — K219 Gastro-esophageal reflux disease without esophagitis: Secondary | ICD-10-CM | POA: Diagnosis not present

## 2013-01-28 DIAGNOSIS — E119 Type 2 diabetes mellitus without complications: Secondary | ICD-10-CM | POA: Diagnosis not present

## 2013-01-28 DIAGNOSIS — G4730 Sleep apnea, unspecified: Secondary | ICD-10-CM | POA: Diagnosis not present

## 2013-01-28 DIAGNOSIS — J387 Other diseases of larynx: Secondary | ICD-10-CM | POA: Diagnosis not present

## 2013-01-28 DIAGNOSIS — Z87891 Personal history of nicotine dependence: Secondary | ICD-10-CM | POA: Diagnosis not present

## 2013-01-28 DIAGNOSIS — J309 Allergic rhinitis, unspecified: Secondary | ICD-10-CM | POA: Diagnosis not present

## 2013-01-28 DIAGNOSIS — E039 Hypothyroidism, unspecified: Secondary | ICD-10-CM | POA: Diagnosis not present

## 2013-01-28 DIAGNOSIS — I2510 Atherosclerotic heart disease of native coronary artery without angina pectoris: Secondary | ICD-10-CM | POA: Diagnosis not present

## 2013-01-28 DIAGNOSIS — K2970 Gastritis, unspecified, without bleeding: Secondary | ICD-10-CM | POA: Diagnosis not present

## 2013-01-28 DIAGNOSIS — Z9861 Coronary angioplasty status: Secondary | ICD-10-CM | POA: Diagnosis not present

## 2013-01-28 DIAGNOSIS — Z885 Allergy status to narcotic agent status: Secondary | ICD-10-CM | POA: Diagnosis not present

## 2013-01-28 DIAGNOSIS — Z888 Allergy status to other drugs, medicaments and biological substances status: Secondary | ICD-10-CM | POA: Diagnosis not present

## 2013-01-28 DIAGNOSIS — Z794 Long term (current) use of insulin: Secondary | ICD-10-CM | POA: Diagnosis not present

## 2013-01-28 DIAGNOSIS — K319 Disease of stomach and duodenum, unspecified: Secondary | ICD-10-CM | POA: Diagnosis not present

## 2013-01-28 DIAGNOSIS — K229 Disease of esophagus, unspecified: Secondary | ICD-10-CM | POA: Diagnosis not present

## 2013-01-28 DIAGNOSIS — R1310 Dysphagia, unspecified: Secondary | ICD-10-CM | POA: Diagnosis not present

## 2013-01-28 DIAGNOSIS — R51 Headache: Secondary | ICD-10-CM | POA: Diagnosis not present

## 2013-01-28 DIAGNOSIS — Z79899 Other long term (current) drug therapy: Secondary | ICD-10-CM | POA: Diagnosis not present

## 2013-01-29 LAB — PATHOLOGY REPORT

## 2013-01-30 ENCOUNTER — Encounter: Payer: Self-pay | Admitting: General Surgery

## 2013-01-30 ENCOUNTER — Institutional Professional Consult (permissible substitution): Payer: Medicare Other | Admitting: Pulmonary Disease

## 2013-02-02 ENCOUNTER — Ambulatory Visit: Payer: Self-pay | Admitting: Internal Medicine

## 2013-02-04 ENCOUNTER — Telehealth: Payer: Self-pay | Admitting: *Deleted

## 2013-02-04 ENCOUNTER — Ambulatory Visit (INDEPENDENT_AMBULATORY_CARE_PROVIDER_SITE_OTHER): Payer: Medicare Other | Admitting: Family Medicine

## 2013-02-04 ENCOUNTER — Encounter: Payer: Self-pay | Admitting: Family Medicine

## 2013-02-04 VITALS — BP 144/38 | HR 68 | Temp 98.1°F | Ht 66.0 in | Wt 193.0 lb

## 2013-02-04 DIAGNOSIS — G4700 Insomnia, unspecified: Secondary | ICD-10-CM

## 2013-02-04 DIAGNOSIS — Z23 Encounter for immunization: Secondary | ICD-10-CM

## 2013-02-04 DIAGNOSIS — I10 Essential (primary) hypertension: Secondary | ICD-10-CM | POA: Diagnosis not present

## 2013-02-04 DIAGNOSIS — M545 Low back pain, unspecified: Secondary | ICD-10-CM

## 2013-02-04 DIAGNOSIS — L659 Nonscarring hair loss, unspecified: Secondary | ICD-10-CM

## 2013-02-04 DIAGNOSIS — F341 Dysthymic disorder: Secondary | ICD-10-CM | POA: Diagnosis not present

## 2013-02-04 DIAGNOSIS — F418 Other specified anxiety disorders: Secondary | ICD-10-CM

## 2013-02-04 MED ORDER — HYDROCHLOROTHIAZIDE 12.5 MG PO TABS: 12.5000 mg | ORAL_TABLET | Freq: Every day | ORAL | Status: DC

## 2013-02-04 MED ORDER — AMITRIPTYLINE HCL 25 MG PO TABS: 25.0000 mg | ORAL_TABLET | Freq: Every day | ORAL | Status: DC

## 2013-02-04 NOTE — Progress Notes (Signed)
Nature conservation officer at [facility] [address] [place] [address] Phone: [phone] Fax: [phone]  Date:  02/04/2013   Name:  [name]   DOB:  1945-11-19   MRN:  [id number] Gender: female Age: 67 y.o.  Primary Physician:  Ruthe Mannan, MD  Evaluating MD: Hannah Beat, MD   Chief Complaint: Discuss issues with BP, sleep   History of Present Illness:  [name] is a 67 y.o. pleasant patient who presents with the following:  The patient comes in for what I thought was initially back pain, but ultimately she has multiple other complaints the take precedence. She has some worries about her high blood pressure. It is been running around 160/70. I rechecked this myself in the office as it is notable high.  She also has at least a 4 or 5 date a long history with chronic insomnia. She currently is taking 300 mg of trazodone at night. She is curious about potential other options. She has had extensive list of medication and tolerances including Ambien. She also is intolerant to Remeron.  Hair falling out. She is worried about this.  Anxiety and depression: also brings up along with her husband that they are concerned that her Prozac is no longer working adequately. She is having significant breakthrough anxiety and she is having some significant depression. She is currently on 80 mg of Prozac daily. She is also taking Ativan routinely. She also is on 300 mg of trazodone. She denies any suicidality or homicidality.  Patient Active Problem List   Diagnosis Date Noted  . GERD (gastroesophageal reflux disease) 01/19/2013  . Dysphagia, unspecified(787.20) 01/19/2013  . Low back pain 01/06/2013  . Dyspnea 11/17/2012  . Weakness 11/17/2012  . Abnormal MRI of head 09/05/2012  . Depression with anxiety 03/21/2012  . Extrinsic asthma 11/12/2011  . Anemia 06/07/2011  . PULMONARY NODULE, SOLITARY 01/25/2010  . UNSPECIFIED HYPOTHYROIDISM 08/10/2009  . MICROSCOPIC HEMATURIA  05/07/2009  . GOITER 03/02/2009  . HYPERTENSION, BENIGN 01/06/2009  . MEMORY LOSS 06/23/2008  . OBSTRUCTIVE SLEEP APNEA 12/25/2007  . CORONARY ARTERY DISEASE 11/11/2007  . DIABETES MELLITUS, TYPE II 09/09/2006  . HYPERCHOLESTEROLEMIA 09/09/2006  . ALLERGIC RHINITIS 09/09/2006  . GERD 09/09/2006  . DIVERTICULOSIS, COLON 09/09/2006    Past Medical History  Diagnosis Date  . Allergic rhinitis   . Diabetes mellitus     type 2  . Diverticulitis of colon   . GERD (gastroesophageal reflux disease)   . Chronic depression   . Hypercholesterolemia     intolerance of statins and niaspan  . Apnea, sleep     mild, intolerant of cpap  . Coronary artery disease   . ACE-inhibitor cough   . Cataract   . Chronic headache   . Asthma, acute   . Hearing loss   . History of blood transfusion 2013    Past Surgical History  Procedure Laterality Date  . Cholecystectomy  2001  . Colectomy      lap sigmoid  . Tonsillectomy    . Bartholin gland cyst excision    . Tubal ligation    . Bladder suspension    . Breast cyst excision  1988    bilateral nonmalignant tumors, x3  . Vaginal delivery      3  . Thyroidectomy    . Abdominal hysterectomy w/ partial vaginactomy    . Appendectomy  1964  . Cardiac catheterization    . Carotid stent    . Colonoscopy  2014    polyps found, 2 clamped off.    History   Social History  . Marital Status: Married    Spouse Name: N/A    Number of Children: 3  . Years of Education: N/A   Occupational History  . Retired     Lawyer   Social History Main Topics  . Smoking status: Former Smoker -- 1.00 packs/day for 30 years    Types: Cigarettes    Quit date: 02/03/2008  . Smokeless tobacco: Never Used  . Alcohol Use: No  . Drug Use: No  . Sexual Activity: Not on file   Other Topics Concern  . Not on file   Social History Narrative  . No narrative on file    Family History  Problem Relation Age of Onset  . Breast cancer Mother     breast  .  Hypertension Father   . Cancer Father     mesothelioma  . Asthma Father   . Stroke Paternal Grandfather   . Heart disease      Allergies  Allergen Reactions  . Voltaren [Diclofenac Sodium] Shortness Of Breath  . Budesonide-Formoterol Fumarate     Shakiness, tremors  . Atorvastatin   . Bupropion Hcl   . Codeine Sulfate   . Gabapentin   . Latex   . Lisinopril     REACTION: cough  . Lorazepam      Causes double vision at highter than .5 mg dose  . Meperidine Hcl   . Metformin     REACTION: unspecified  . Mirtazapine   . Mometasone Furoate     REACTION: Nause and vomiting  . Morphine Sulfate     REACTION: Chest pain  . Olanzapine   . Other     Beta Blockers, reaction unknown but listed  . Oxycodone-Acetaminophen   . Propoxyphene-Acetaminophen   . Rosuvastatin     REACTION: Makes patient weak.  . Shellfish Allergy   . Venlafaxine     REACTION: unspecified  . Zolpidem Tartrate     REACTION: Jittery, diarrhea    Medication list has been reviewed and updated.  Outpatient Prescriptions Prior to Visit  Medication Sig Dispense Refill  . Calcium-Vitamin D (CALTRATE 600 PLUS-VIT D PO) Take 1 tablet by mouth 2 (two) times daily.       Marland Kitchen FLUoxetine (PROZAC) 40 MG capsule TAKE 1 CAPSULE TWICE DAILY  180 capsule  0  . glucose blood test strip (OneTouch Ultra test strips) use to check blood sugar four times daily  100 each  11  . insulin aspart (NOVOLOG) 100 UNIT/ML injection Inject into the skin. Sliding scale as needed      . insulin lispro (HUMALOG) 100 UNIT/ML injection 112 units in the morning and 90 at dinner      . Insulin Pen Needle 32G X 6 MM MISC Use twice daily with byetta  100 each  5  . lansoprazole (PREVACID) 30 MG capsule Take one by mouth before meal twice daily  180 capsule  3  . levothyroxine (SYNTHROID, LEVOTHROID) 150 MCG tablet Take 150 mcg by mouth daily.        Marland Kitchen LORazepam (ATIVAN) 0.5 MG tablet Take 0.5 mg by mouth every 8 (eight) hours.      .  nitroGLYCERIN (NITROLINGUAL) 0.4 MG/SPRAY spray Place 1 spray under the tongue as needed. Chest pain  12 g  3  . traMADol (ULTRAM) 50 MG tablet Take 1 tablet (50 mg total) by mouth every 6 (six)  hours as needed for pain.  50 tablet  2  . traZODone (DESYREL) 100 MG tablet Take 200 mg by mouth at bedtime.       . famotidine (PEPCID) 20 MG tablet Take 1 tablet (20 mg total) by mouth at bedtime.  30 tablet  6   No facility-administered medications prior to visit.    Review of Systems:  As above. No chest pain. No shortness of breath. No fever. Back pain.  Physical Examination: BP 144/38  Pulse 68  Temp(Src) 98.1 F (36.7 C)  Ht 5\' 6"  (1.676 m)  Wt 193 lb (87.544 kg)  BMI 31.17 kg/m2  Ideal Body Weight: Weight in (lb) to have BMI = 25: 154.6   GEN: WDWN, NAD, Non-toxic, A & O x 3 HEENT: Atraumatic, Normocephalic. Neck supple. No masses, No LAD. Ears and Nose: No external deformity. CV: RRR, No M/G/R. No JVD. No thrill. No extra heart sounds. PULM: CTA B, no wheezes, crackles, rhonchi. No retractions. No resp. distress. No accessory muscle use. EXTR: No c/c/e NEURO Normal gait.  PSYCH: Normally interactive. Conversant. Not depressed or anxious appearing.  Calm demeanor.    Assessment and Plan:  HYPERTENSION, BENIGN: start HCTZ  Need for prophylactic vaccination and inoculation against influenza - Plan: Flu Vaccine QUAD 36+ mos IM  Depression with anxiety: complex. Overall, beyond the scope of my care to take care of and in acute work and situation. I advised her to stop her trazodone and and initiated Elavil 25 mg at night.  I did have her followup with Dr. Dayton Martes in the next 1-2 weeks.  Low back pain: chronic issue, minimally address with >30 min spent on other problems.  Insomnia: trial of elavil.  Alopecia: Rogaine. A f/u TSH would be reasonable to do, which I did not do.  F/u 1-2 weeks with Dr. Dayton Martes  Orders Today:  Orders Placed This Encounter  Procedures  . Flu  Vaccine QUAD 36+ mos IM    Updated Medication List: (Includes new medications, updates to list, dose adjustments) Meds ordered this encounter  Medications  . amitriptyline (ELAVIL) 25 MG tablet    Sig: Take 1 tablet (25 mg total) by mouth at bedtime.    Dispense:  30 tablet    Refill:  3  . hydrochlorothiazide (HYDRODIURIL) 12.5 MG tablet    Sig: Take 1 tablet (12.5 mg total) by mouth daily.    Dispense:  30 tablet    Refill:  3    Medications Discontinued: Medications Discontinued During This Encounter  Medication Reason  . famotidine (PEPCID) 20 MG tablet Error  . traZODone (DESYREL) 100 MG tablet       Signed, Jessica Checketts T. Deairra Halleck, MD 02/04/2013 1:19 PM

## 2013-02-04 NOTE — Telephone Encounter (Signed)
Result Note    Pathology report benign findings. Let pt know. She needs to see ENT-Dr. Wardell Heath for her dysphagia and findings in larynx area. Please schedule an appt with him- send copy of upper endoscopy report with pictures to him.   Message for patient to call the office.   An appointment has been scheduled for her to see Dr. Willeen Cass at [facility] for 02-05-13 at 2:15 pm. She will need to bring her insurance card and medications to appointment.

## 2013-02-04 NOTE — Patient Instructions (Signed)
Insomnia:  Melatonin 3-5 mg can be taken 1 hour before sleep very safely every day  Antihistamines: 2 tabs of Benadryl is OK Can also take 2 Dramamine (get the older version that can cause drowsiness) Or Unisom (doxylamine)   Stop trazadone Try amitryptiline 25 mg at bedtime  Get Rogaine 5% -- extra strength, twice a day.  And Prenatal vitamins with biotin

## 2013-02-04 NOTE — Telephone Encounter (Signed)
Patient's spouse called back and he and patient were notified as instructed. This patient is refusing to go to appointment with Dr. Willeen Cass because she feels she does not need appointment. Dr. Talmage Nap office was contacted and appointment has been cancelled for tomorrow.

## 2013-02-05 ENCOUNTER — Encounter: Payer: Self-pay | Admitting: General Surgery

## 2013-02-06 DIAGNOSIS — G4700 Insomnia, unspecified: Secondary | ICD-10-CM | POA: Insufficient documentation

## 2013-02-17 ENCOUNTER — Ambulatory Visit: Payer: Medicare Other | Admitting: Pulmonary Disease

## 2013-02-23 ENCOUNTER — Other Ambulatory Visit: Payer: Self-pay | Admitting: Family Medicine

## 2013-02-27 ENCOUNTER — Other Ambulatory Visit: Payer: Self-pay | Admitting: *Deleted

## 2013-02-27 MED ORDER — TRAMADOL HCL 50 MG PO TABS: 50.0000 mg | ORAL_TABLET | Freq: Four times a day (QID) | ORAL | Status: DC | PRN

## 2013-02-27 NOTE — Telephone Encounter (Signed)
OK to refill? Last filled 12/17/12.

## 2013-02-27 NOTE — Telephone Encounter (Signed)
Rx called in as directed.   

## 2013-03-06 ENCOUNTER — Encounter: Payer: Self-pay | Admitting: Family Medicine

## 2013-03-06 ENCOUNTER — Ambulatory Visit (INDEPENDENT_AMBULATORY_CARE_PROVIDER_SITE_OTHER): Payer: Medicare Other | Admitting: Family Medicine

## 2013-03-06 VITALS — BP 142/62 | HR 62 | Temp 97.7°F | Wt 189.5 lb

## 2013-03-06 DIAGNOSIS — G4700 Insomnia, unspecified: Secondary | ICD-10-CM | POA: Diagnosis not present

## 2013-03-06 DIAGNOSIS — L659 Nonscarring hair loss, unspecified: Secondary | ICD-10-CM | POA: Insufficient documentation

## 2013-03-06 DIAGNOSIS — E039 Hypothyroidism, unspecified: Secondary | ICD-10-CM

## 2013-03-06 DIAGNOSIS — I10 Essential (primary) hypertension: Secondary | ICD-10-CM | POA: Diagnosis not present

## 2013-03-06 DIAGNOSIS — E1165 Type 2 diabetes mellitus with hyperglycemia: Secondary | ICD-10-CM | POA: Diagnosis not present

## 2013-03-06 MED ORDER — TRAZODONE HCL 100 MG PO TABS: 200.0000 mg | ORAL_TABLET | Freq: Every day | ORAL | Status: DC

## 2013-03-06 NOTE — Progress Notes (Signed)
Patient ID: [name], female    DOB: 1945/06/26, 67 y.o.   MRN: [id number]  HPI Comments: [name] is a 67 year old woman with a history of hypertension, hyperlipidemia, diabetes, COPD, chronic cough and CAD here to discuss her medication.  Anxiety/depression- long term, chronic issue.  Intolerant of multiple antidepressants/anxiolytics-- see allergy list- she is unsure which are true allergies versus intolerances.  Most recently we tried to wean her off prozac and start celexa- she could not tolerate the celexa and started back on prozac.  Repeatedly refuses psychotherapy or referral to a psychiatrist.    Saw Dr. Patsy Lager last month- he advised her stop trazadone and start Elavil and Benadryl. She said both made her jittery and so she stopped them and restarted the Trazodone 200 mg qhs.  Feels it is working better now.  Sleeping better and feels less depressed.   Followed by Dr. Tedd Sias for DM and hypothyroidism.  Saw her this am and labs were drawn.  Previous TSH last checked by Dr. Tedd Sias in 08/2012- wnl at 1.100. Compliant with her synthroid.   Dr. Patsy Lager also restart HCTZ 12. 5 mg daily.  Denies any HA, blurred vision, CP or SOB.  Checked her BP at home yesterday and it was 137/66.  Alopecia-  Persistent issue.  Dr. Patsy Lager advised Rogain but she decided against it since it was just a temporary solution.  Patient Active Problem List   Diagnosis Date Noted  . Insomnia 02/06/2013  . GERD (gastroesophageal reflux disease) 01/19/2013  . Dysphagia, unspecified(787.20) 01/19/2013  . Low back pain 01/06/2013  . Dyspnea 11/17/2012  . Weakness 11/17/2012  . Abnormal MRI of head 09/05/2012  . Depression with anxiety 03/21/2012  . Extrinsic asthma 11/12/2011  . Anemia 06/07/2011  . PULMONARY NODULE, SOLITARY 01/25/2010  . UNSPECIFIED HYPOTHYROIDISM 08/10/2009  . MICROSCOPIC HEMATURIA 05/07/2009  . GOITER 03/02/2009  . HYPERTENSION, BENIGN 01/06/2009  . MEMORY LOSS 06/23/2008  .  OBSTRUCTIVE SLEEP APNEA 12/25/2007  . CORONARY ARTERY DISEASE 11/11/2007  . DIABETES MELLITUS, TYPE II 09/09/2006  . HYPERCHOLESTEROLEMIA 09/09/2006  . ALLERGIC RHINITIS 09/09/2006  . GERD 09/09/2006  . DIVERTICULOSIS, COLON 09/09/2006   Past Medical History  Diagnosis Date  . Allergic rhinitis   . Diabetes mellitus     type 2  . Diverticulitis of colon   . GERD (gastroesophageal reflux disease)   . Chronic depression   . Hypercholesterolemia     intolerance of statins and niaspan  . Apnea, sleep     mild, intolerant of cpap  . Coronary artery disease   . ACE-inhibitor cough   . Cataract   . Chronic headache   . Asthma, acute   . Hearing loss   . History of blood transfusion 2013   Past Surgical History  Procedure Laterality Date  . Cholecystectomy  2001  . Colectomy      lap sigmoid  . Tonsillectomy    . Bartholin gland cyst excision    . Tubal ligation    . Bladder suspension    . Breast cyst excision  1988    bilateral nonmalignant tumors, x3  . Vaginal delivery      3  . Thyroidectomy    . Abdominal hysterectomy w/ partial vaginactomy    . Appendectomy  1964  . Cardiac catheterization    . Carotid stent    . Colonoscopy  2014    polyps found, 2 clamped off.   History  Substance Use Topics  . Smoking status: Former  Smoker -- 1.00 packs/day for 30 years    Types: Cigarettes    Quit date: 02/03/2008  . Smokeless tobacco: Never Used  . Alcohol Use: No   Family History  Problem Relation Age of Onset  . Breast cancer Mother     breast  . Hypertension Father   . Cancer Father     mesothelioma  . Asthma Father   . Stroke Paternal Grandfather   . Heart disease     Allergies  Allergen Reactions  . Voltaren [Diclofenac Sodium] Shortness Of Breath  . Budesonide-Formoterol Fumarate     Shakiness, tremors  . Amitriptyline   . Atorvastatin   . Benadryl [Diphenhydramine]   . Bupropion Hcl   . Codeine Sulfate   . Gabapentin   . Latex   . Lisinopril      REACTION: cough  . Lorazepam      Causes double vision at highter than .5 mg dose  . Meperidine Hcl   . Metformin     REACTION: unspecified  . Mirtazapine   . Mometasone Furoate     REACTION: Nause and vomiting  . Morphine Sulfate     REACTION: Chest pain  . Olanzapine   . Other     Beta Blockers, reaction unknown but listed  . Oxycodone-Acetaminophen   . Propoxyphene-Acetaminophen   . Rosuvastatin     REACTION: Makes patient weak.  . Shellfish Allergy   . Venlafaxine     REACTION: unspecified  . Zolpidem Tartrate     REACTION: Jittery, diarrhea   Current Outpatient Prescriptions on File Prior to Visit  Medication Sig Dispense Refill  . Calcium-Vitamin D (CALTRATE 600 PLUS-VIT D PO) Take 1 tablet by mouth 2 (two) times daily.       Marland Kitchen FLUoxetine (PROZAC) 40 MG capsule TAKE 1 CAPSULE TWICE DAILY  180 capsule  0  . glucose blood test strip (OneTouch Ultra test strips) use to check blood sugar four times daily  100 each  11  . hydrochlorothiazide (HYDRODIURIL) 12.5 MG tablet Take 1 tablet (12.5 mg total) by mouth daily.  30 tablet  3  . insulin aspart (NOVOLOG) 100 UNIT/ML injection Inject into the skin. Sliding scale as needed      . insulin lispro (HUMALOG) 100 UNIT/ML injection 112 units in the morning and 90 at dinner      . Insulin Pen Needle 32G X 6 MM MISC Use twice daily with byetta  100 each  5  . lansoprazole (PREVACID) 30 MG capsule Take one by mouth before meal twice daily  180 capsule  3  . levothyroxine (SYNTHROID, LEVOTHROID) 150 MCG tablet Take 150 mcg by mouth daily.        Marland Kitchen LORazepam (ATIVAN) 0.5 MG tablet Take 0.5 mg by mouth every 8 (eight) hours.      . nitroGLYCERIN (NITROLINGUAL) 0.4 MG/SPRAY spray Place 1 spray under the tongue as needed. Chest pain  12 g  3  . traMADol (ULTRAM) 50 MG tablet Take 1 tablet (50 mg total) by mouth every 6 (six) hours as needed for pain.  50 tablet  2   No current facility-administered medications on file prior to visit.    The PMH, PSH, Social History, Family History, Medications, and allergies have been reviewed in [facility], and have been updated if relevant.     Review of Systems  See HPI  Physical Exam  BP 142/62  Pulse 62  Temp(Src) 97.7 F (36.5 C) (Oral)  Wt 189 lb 8  oz (85.957 kg)  BMI 30.6 kg/m2  SpO2 97%  Constitutional: She is oriented to person, place, and time. She appears well-developed and well-nourished.  Psychiatric: flat affect but she is more interactive and smiling today Not anxious appearing        Assessment and Plan  1. Unspecified hypothyroidism Followed by Dr. Tedd Sias.  Awaiting labs.  2. Insomnia Improved now that she restarted Trazadone.  Felt she was allergic to Benadryl and Elavil.  3. HYPERTENSION, BENIGN Stable on low dose HCTZ.  No changes.  4. Alopecia Refuses Rogain. Awaiting thyroid labs from Dr. Tedd Sias.

## 2013-03-13 DIAGNOSIS — E039 Hypothyroidism, unspecified: Secondary | ICD-10-CM | POA: Diagnosis not present

## 2013-03-13 DIAGNOSIS — E119 Type 2 diabetes mellitus without complications: Secondary | ICD-10-CM | POA: Diagnosis not present

## 2013-03-13 DIAGNOSIS — J301 Allergic rhinitis due to pollen: Secondary | ICD-10-CM | POA: Diagnosis not present

## 2013-03-23 DIAGNOSIS — J301 Allergic rhinitis due to pollen: Secondary | ICD-10-CM | POA: Diagnosis not present

## 2013-03-25 ENCOUNTER — Telehealth: Payer: Self-pay

## 2013-03-25 NOTE — Telephone Encounter (Signed)
Pt's husband request refill Trazodone to [facility];advised refilled on 03/06/13. Mr Lesperance wanted to know if could do 90 day instead of 30 day; explained depending on control level of med the doctor may not refill for 90 day rx. Mr Bradt voiced understanding.

## 2013-03-31 ENCOUNTER — Ambulatory Visit: Payer: Self-pay | Admitting: Internal Medicine

## 2013-03-31 DIAGNOSIS — D509 Iron deficiency anemia, unspecified: Secondary | ICD-10-CM | POA: Diagnosis not present

## 2013-03-31 DIAGNOSIS — Z79899 Other long term (current) drug therapy: Secondary | ICD-10-CM | POA: Diagnosis not present

## 2013-04-01 LAB — IRON AND TIBC
Iron Bind.Cap.(Total): 259 ug/dL (ref 250–450)
Iron Saturation: 19 %
Iron: 48 ug/dL — ABNORMAL LOW (ref 50–170)
Unbound Iron-Bind.Cap.: 211 ug/dL

## 2013-04-01 LAB — FERRITIN: Ferritin (ARMC): 707 ng/mL — ABNORMAL HIGH (ref 8–388)

## 2013-04-04 ENCOUNTER — Ambulatory Visit: Payer: Self-pay | Admitting: Internal Medicine

## 2013-05-01 ENCOUNTER — Ambulatory Visit (INDEPENDENT_AMBULATORY_CARE_PROVIDER_SITE_OTHER): Payer: Medicare Other | Admitting: Family Medicine

## 2013-05-01 ENCOUNTER — Encounter: Payer: Self-pay | Admitting: Family Medicine

## 2013-05-01 VITALS — BP 148/60 | HR 60 | Temp 97.6°F | Ht 66.0 in | Wt 190.0 lb

## 2013-05-01 DIAGNOSIS — M19049 Primary osteoarthritis, unspecified hand: Secondary | ICD-10-CM | POA: Diagnosis not present

## 2013-05-01 DIAGNOSIS — M189 Osteoarthritis of first carpometacarpal joint, unspecified: Secondary | ICD-10-CM

## 2013-05-01 NOTE — Progress Notes (Signed)
Pre-visit discussion using our clinic review tool. No additional management support is needed unless otherwise documented below in the visit note.  

## 2013-05-01 NOTE — Progress Notes (Signed)
Date:  05/01/2013   Name:  [name]   DOB:  September 18, 1945   MRN:  [id number] Gender: female Age: 67 y.o.  Primary Physician:  Ruthe Mannan, MD   Chief Complaint: Thumb pain   Subjective:   History of Present Illness:  [name] is a 67 y.o. very pleasant female patient who presents with the following:  Thumb is in agony - has been for months. Her pain is primarily concentrated around [facility] joint and MCP joint on the left thumb. She is markedly limited on her ability to grasp things. I have injected her CMC joint previously, with good success.  L CMC joint  Thumb spica - L inj CMC    Past Medical History, Surgical History, Social History, Family History, Problem List, Medications, and Allergies have been reviewed and updated if relevant.  Review of Systems:  GEN: No fevers, chills. Nontoxic. Primarily MSK c/o today. MSK: Detailed in the HPI GI: tolerating PO intake without difficulty Neuro: No numbness, parasthesias, or tingling associated. Otherwise the pertinent positives of the ROS are noted above.   Objective:   Physical Examination: BP 148/60  Pulse 60  Temp(Src) 97.6 F (36.4 C) (Oral)  Ht 5\' 6"  (1.676 m)  Wt 190 lb (86.183 kg)  BMI 30.68 kg/m2   GEN: WDWN, NAD, Non-toxic, Alert & Oriented x 3 HEENT: Atraumatic, Normocephalic.  Ears and Nose: No external deformity. EXTR: No clubbing/cyanosis/edema NEURO: Normal gait.  PSYCH: Normally interactive. Conversant. Not depressed or anxious appearing.  Calm demeanor.   L hand Ecchymosis or edema: neg ROM wrist/hand/digits: Approximate 50% loss of motion at the wrist. There is some fullness. This is in the true wrist. Carpals, MCP's, digits: There are diffuse osteoarthritic changes at virtually all joint of both hands in the PIP, dip., and MCP joints as well as a CMC of each first digit. Distal Ulna and Radius: NT Ecchymosis or edema: neg No instability Cysts/nodules: neg Digit triggering:  neg Finkelstein's test: neg Snuffbox tenderness: neg Scaphoid tubercle: NT Resisted supination: NT Full composite fist, no malrotation Grip, all digits: 4/5 str DIPJT: NT PIP JT: NT MCP JT: NT No tenosynovitis Axial load test: pain Atrophy: mild  Hand sensation: intact   Radiology: No results found.  Assessment & Plan:    Osteoarthritis of CMC joint of thumb  Hand arthritis  I discussed general arthritis maintenance, and I discussed CMC joint replacement.  CMC joint injection, LEFT Verbal consent was obtained. Risks (including rare infection, pain), benefits, and alternatives were discussed. Prepped with Chloraprep and Ethyl Chloride used for anesthesia. Under sterile conditions, patient injected into 1st CMC joint at joint line in apex of snuffbox inserting perpendicularly with traction placed on thumb. Aspiration yields no blood. Decreased pain post injection. No complications. Needle size: 25 gauge Injection: 1/2 cc of Lidocaine 1% and 1/2 cc of Depo-Medrol 40 mg   Signed,  Carleta Woodrow T. Zamani Crocker, MD, CAQ Sports Medicine  [facility] [address] [place] [address] Phone: [phone] Fax: [phone]  Updated Complete Medication List:   Medication List       This list is accurate as of: 05/01/13  4:25 PM.  Always use your most recent med list.               CALTRATE 600 PLUS-VIT D PO  Take 1 tablet by mouth 2 (two) times daily.     FLUoxetine 40 MG capsule  Commonly known as:  PROZAC  TAKE 1  CAPSULE TWICE DAILY     glucose blood test strip  (OneTouch Ultra test strips) use to check blood sugar four times daily     HUMALOG 100 UNIT/ML injection  Generic drug:  insulin lispro  112 units in the morning and 90 at dinner     hydrochlorothiazide 12.5 MG tablet  Commonly known as:  HYDRODIURIL  Take 1 tablet (12.5 mg total) by mouth daily.     Insulin Pen Needle 32G X 6 MM Misc  Use twice daily with byetta      lansoprazole 30 MG capsule  Commonly known as:  PREVACID  Take one by mouth before meal twice daily     levothyroxine 150 MCG tablet  Commonly known as:  SYNTHROID, LEVOTHROID  Take 150 mcg by mouth daily.     LORazepam 0.5 MG tablet  Commonly known as:  ATIVAN  Take 0.5 mg by mouth every 8 (eight) hours.     nitroGLYCERIN 0.4 MG/SPRAY spray  Commonly known as:  NITROLINGUAL  Place 1 spray under the tongue as needed. Chest pain     NOVOLOG 100 UNIT/ML injection  Generic drug:  insulin aspart  Inject into the skin. Sliding scale as needed     traMADol 50 MG tablet  Commonly known as:  ULTRAM  Take 1 tablet (50 mg total) by mouth every 6 (six) hours as needed for pain.     traZODone 100 MG tablet  Commonly known as:  DESYREL  Take 2 tablets (200 mg total) by mouth at bedtime.

## 2013-05-23 ENCOUNTER — Other Ambulatory Visit: Payer: Self-pay | Admitting: Family Medicine

## 2013-06-10 ENCOUNTER — Telehealth: Payer: Self-pay | Admitting: *Deleted

## 2013-06-10 DIAGNOSIS — J301 Allergic rhinitis due to pollen: Secondary | ICD-10-CM | POA: Diagnosis not present

## 2013-06-10 NOTE — Telephone Encounter (Signed)
Pt was in recalls to do her mammogram in February 2015 and she wants to let you know that she wants to wait to do another mammogram in about 2 years due to her age. She doesn't want to do her mammogram this February.

## 2013-06-12 DIAGNOSIS — E119 Type 2 diabetes mellitus without complications: Secondary | ICD-10-CM | POA: Diagnosis not present

## 2013-06-12 DIAGNOSIS — J301 Allergic rhinitis due to pollen: Secondary | ICD-10-CM | POA: Diagnosis not present

## 2013-06-17 DIAGNOSIS — J301 Allergic rhinitis due to pollen: Secondary | ICD-10-CM | POA: Diagnosis not present

## 2013-06-18 DIAGNOSIS — H9319 Tinnitus, unspecified ear: Secondary | ICD-10-CM | POA: Diagnosis not present

## 2013-06-18 DIAGNOSIS — H903 Sensorineural hearing loss, bilateral: Secondary | ICD-10-CM | POA: Diagnosis not present

## 2013-06-18 DIAGNOSIS — H905 Unspecified sensorineural hearing loss: Secondary | ICD-10-CM | POA: Diagnosis not present

## 2013-06-18 DIAGNOSIS — E119 Type 2 diabetes mellitus without complications: Secondary | ICD-10-CM | POA: Diagnosis not present

## 2013-06-19 DIAGNOSIS — E119 Type 2 diabetes mellitus without complications: Secondary | ICD-10-CM | POA: Diagnosis not present

## 2013-06-19 DIAGNOSIS — Z794 Long term (current) use of insulin: Secondary | ICD-10-CM | POA: Diagnosis not present

## 2013-06-19 DIAGNOSIS — E039 Hypothyroidism, unspecified: Secondary | ICD-10-CM | POA: Diagnosis not present

## 2013-06-19 DIAGNOSIS — I10 Essential (primary) hypertension: Secondary | ICD-10-CM | POA: Diagnosis not present

## 2013-06-19 LAB — HEMOGLOBIN A1C: Hgb A1c MFr Bld: 5.9 % (ref 4.0–6.0)

## 2013-06-24 ENCOUNTER — Ambulatory Visit: Payer: Self-pay | Admitting: Internal Medicine

## 2013-06-24 DIAGNOSIS — E039 Hypothyroidism, unspecified: Secondary | ICD-10-CM | POA: Diagnosis not present

## 2013-06-24 DIAGNOSIS — I10 Essential (primary) hypertension: Secondary | ICD-10-CM | POA: Diagnosis not present

## 2013-06-24 DIAGNOSIS — Z794 Long term (current) use of insulin: Secondary | ICD-10-CM | POA: Diagnosis not present

## 2013-06-24 DIAGNOSIS — E785 Hyperlipidemia, unspecified: Secondary | ICD-10-CM | POA: Diagnosis not present

## 2013-06-24 DIAGNOSIS — D509 Iron deficiency anemia, unspecified: Secondary | ICD-10-CM | POA: Diagnosis not present

## 2013-06-24 DIAGNOSIS — K219 Gastro-esophageal reflux disease without esophagitis: Secondary | ICD-10-CM | POA: Diagnosis not present

## 2013-06-24 DIAGNOSIS — E11329 Type 2 diabetes mellitus with mild nonproliferative diabetic retinopathy without macular edema: Secondary | ICD-10-CM | POA: Diagnosis not present

## 2013-06-24 DIAGNOSIS — G4733 Obstructive sleep apnea (adult) (pediatric): Secondary | ICD-10-CM | POA: Diagnosis not present

## 2013-06-24 DIAGNOSIS — IMO0001 Reserved for inherently not codable concepts without codable children: Secondary | ICD-10-CM | POA: Diagnosis not present

## 2013-06-24 DIAGNOSIS — E119 Type 2 diabetes mellitus without complications: Secondary | ICD-10-CM | POA: Diagnosis not present

## 2013-06-24 DIAGNOSIS — Z79899 Other long term (current) drug therapy: Secondary | ICD-10-CM | POA: Diagnosis not present

## 2013-06-24 LAB — CBC CANCER CENTER
BASOS ABS: 0.1 x10 3/mm (ref 0.0–0.1)
BASOS PCT: 1.1 %
Eosinophil #: 0.3 x10 3/mm (ref 0.0–0.7)
Eosinophil %: 2.7 %
HCT: 38.1 % (ref 35.0–47.0)
HGB: 12.6 g/dL (ref 12.0–16.0)
LYMPHS ABS: 2.2 x10 3/mm (ref 1.0–3.6)
Lymphocyte %: 21.7 %
MCH: 28.3 pg (ref 26.0–34.0)
MCHC: 33 g/dL (ref 32.0–36.0)
MCV: 86 fL (ref 80–100)
Monocyte #: 0.7 x10 3/mm (ref 0.2–0.9)
Monocyte %: 7.4 %
Neutrophil #: 6.8 x10 3/mm — ABNORMAL HIGH (ref 1.4–6.5)
Neutrophil %: 67.1 %
Platelet: 182 x10 3/mm (ref 150–440)
RBC: 4.44 10*6/uL (ref 3.80–5.20)
RDW: 13.2 % (ref 11.5–14.5)
WBC: 10.2 x10 3/mm (ref 3.6–11.0)

## 2013-06-24 LAB — IRON AND TIBC
IRON SATURATION: 17 %
Iron Bind.Cap.(Total): 264 ug/dL (ref 250–450)
Iron: 45 ug/dL — ABNORMAL LOW (ref 50–170)
Unbound Iron-Bind.Cap.: 219 ug/dL

## 2013-06-24 LAB — FERRITIN: Ferritin (ARMC): 375 ng/mL (ref 8–388)

## 2013-06-26 DIAGNOSIS — E119 Type 2 diabetes mellitus without complications: Secondary | ICD-10-CM | POA: Diagnosis not present

## 2013-06-26 DIAGNOSIS — H18419 Arcus senilis, unspecified eye: Secondary | ICD-10-CM | POA: Diagnosis not present

## 2013-06-26 DIAGNOSIS — H2510 Age-related nuclear cataract, unspecified eye: Secondary | ICD-10-CM | POA: Diagnosis not present

## 2013-06-26 DIAGNOSIS — H02839 Dermatochalasis of unspecified eye, unspecified eyelid: Secondary | ICD-10-CM | POA: Diagnosis not present

## 2013-07-05 ENCOUNTER — Ambulatory Visit: Payer: Self-pay | Admitting: Internal Medicine

## 2013-07-17 DIAGNOSIS — H2510 Age-related nuclear cataract, unspecified eye: Secondary | ICD-10-CM | POA: Diagnosis not present

## 2013-07-20 ENCOUNTER — Ambulatory Visit: Payer: Medicare Other | Admitting: Family Medicine

## 2013-07-22 ENCOUNTER — Encounter: Payer: Self-pay | Admitting: Family Medicine

## 2013-07-22 ENCOUNTER — Ambulatory Visit: Payer: Medicare Other | Admitting: General Surgery

## 2013-07-22 ENCOUNTER — Ambulatory Visit (INDEPENDENT_AMBULATORY_CARE_PROVIDER_SITE_OTHER): Payer: Medicare Other | Admitting: Family Medicine

## 2013-07-22 VITALS — BP 130/56 | HR 66 | Temp 97.8°F | Ht 66.0 in | Wt 184.5 lb

## 2013-07-22 DIAGNOSIS — M76899 Other specified enthesopathies of unspecified lower limb, excluding foot: Secondary | ICD-10-CM

## 2013-07-22 DIAGNOSIS — M6790 Unspecified disorder of synovium and tendon, unspecified site: Secondary | ICD-10-CM

## 2013-07-22 DIAGNOSIS — M67951 Unspecified disorder of synovium and tendon, right thigh: Secondary | ICD-10-CM

## 2013-07-22 DIAGNOSIS — M719 Bursopathy, unspecified: Secondary | ICD-10-CM

## 2013-07-22 DIAGNOSIS — M7061 Trochanteric bursitis, right hip: Secondary | ICD-10-CM

## 2013-07-22 DIAGNOSIS — M6798 Unspecified disorder of synovium and tendon, other site: Secondary | ICD-10-CM

## 2013-07-22 NOTE — Patient Instructions (Signed)
Hand Surgeons Recommended:  Dr. Theodis Sato, [facility], [facility]  Dr. Laurelyn Sickle, [facility]  Dr. Milly Jakob, [place]

## 2013-07-22 NOTE — Progress Notes (Signed)
Date:  07/22/2013   Name:  [name]   DOB:  [birth year]   MRN:  [id number] Gender: female Age: 68 y.o.  Primary Physician:  Arnette Norris, MD   Chief Complaint: Hip Pain   Subjective:   History of Present Illness:  [name] is a 68 y.o. pleasant patient who presents with the following:  Pt with multiple pain complaints, here for acute R lateral hip pain. Previously i have seen her for hand OA and back pain.  Now more focal laterally without groin pain. Mostly superior to the GTB and lateral. No back pain. No radiculopathy.  Patient Active Problem List   Diagnosis Date Noted  . Alopecia 03/06/2013  . Insomnia 02/06/2013  . GERD (gastroesophageal reflux disease) 01/19/2013  . Dysphagia, unspecified(787.20) 01/19/2013  . Low back pain 01/06/2013  . Dyspnea 11/17/2012  . Weakness 11/17/2012  . Abnormal MRI of head 09/05/2012  . Depression with anxiety 03/21/2012  . Extrinsic asthma 11/12/2011  . Anemia 06/07/2011  . PULMONARY NODULE, SOLITARY 01/25/2010  . UNSPECIFIED HYPOTHYROIDISM 08/10/2009  . MICROSCOPIC HEMATURIA 05/07/2009  . GOITER 03/02/2009  . HYPERTENSION, BENIGN 01/06/2009  . MEMORY LOSS 06/23/2008  . OBSTRUCTIVE SLEEP APNEA 12/25/2007  . CORONARY ARTERY DISEASE 11/11/2007  . DIABETES MELLITUS, TYPE II 09/09/2006  . HYPERCHOLESTEROLEMIA 09/09/2006  . ALLERGIC RHINITIS 09/09/2006  . GERD 09/09/2006  . DIVERTICULOSIS, COLON 09/09/2006    Past Medical History  Diagnosis Date  . Allergic rhinitis   . Diabetes mellitus     type 2  . Diverticulitis of colon   . GERD (gastroesophageal reflux disease)   . Chronic depression   . Hypercholesterolemia     intolerance of statins and niaspan  . Apnea, sleep     mild, intolerant of cpap  . Coronary artery disease   . ACE-inhibitor cough   . Cataract   . Chronic headache   . Asthma, acute   . Hearing loss   . History of blood transfusion 2013    Past Surgical History  Procedure Laterality Date  .  Cholecystectomy  2001  . Colectomy      lap sigmoid  . Tonsillectomy    . Bartholin gland cyst excision    . Tubal ligation    . Bladder suspension    . Breast cyst excision  1988    bilateral nonmalignant tumors, x3  . Vaginal delivery      3  . Thyroidectomy    . Abdominal hysterectomy w/ partial vaginactomy    . Appendectomy  1964  . Cardiac catheterization    . Carotid stent    . Colonoscopy  2014    polyps found, 2 clamped off.    History   Social History  . Marital Status: Married    Spouse Name: N/A    Number of Children: 3  . Years of Education: N/A   Occupational History  . Retired     Quarry manager   Social History Main Topics  . Smoking status: Former Smoker -- 1.00 packs/day for 30 years    Types: Cigarettes    Quit date: 02/03/2008  . Smokeless tobacco: Never Used  . Alcohol Use: No  . Drug Use: No  . Sexual Activity: Not on file   Other Topics Concern  . Not on file   Social History Narrative  . No narrative on file    Family History  Problem Relation Age of Onset  . Breast cancer Mother     breast  .  Hypertension Father   . Cancer Father     mesothelioma  . Asthma Father   . Stroke Paternal Grandfather   . Heart disease      Allergies  Allergen Reactions  . Voltaren [Diclofenac Sodium] Shortness Of Breath  . Budesonide-Formoterol Fumarate     Shakiness, tremors  . Amitriptyline   . Atorvastatin   . Benadryl [Diphenhydramine]   . Bupropion Hcl   . Codeine Sulfate   . Gabapentin   . Latex   . Lisinopril     REACTION: cough  . Lorazepam      Causes double vision at highter than .5 mg dose  . Meperidine Hcl   . Metformin     REACTION: unspecified  . Mirtazapine   . Mometasone Furoate     REACTION: Nause and vomiting  . Morphine Sulfate     REACTION: Chest pain  . Olanzapine   . Other     Beta Blockers, reaction unknown but listed  . Oxycodone-Acetaminophen   . Propoxyphene N-Acetaminophen   . Rosuvastatin     REACTION: Makes  patient weak.  . Shellfish Allergy   . Venlafaxine     REACTION: unspecified  . Zolpidem Tartrate     REACTION: Jittery, diarrhea    Medication list has been reviewed and updated.  Review of Systems:  GEN: No fevers, chills. Nontoxic. Primarily MSK c/o today. MSK: Detailed in the HPI GI: tolerating PO intake without difficulty Neuro: No numbness, parasthesias, or tingling associated. Otherwise the pertinent positives of the ROS are noted above.   Objective:   Physical Examination: BP 130/56  Pulse 66  Temp(Src) 97.8 F (36.6 C) (Oral)  Ht 5\' 6"  (1.676 m)  Wt 184 lb 8 oz (83.689 kg)  BMI 29.79 kg/m2  Ideal Body Weight: Weight in (lb) to have BMI = 25: 154.6   GEN: Well-developed,well-nourished,in no acute distress; alert,appropriate and cooperative throughout examination HEENT: Normocephalic and atraumatic without obvious abnormalities. Ears, externally no deformities PULM: Breathing comfortably in no respiratory distress EXT: No clubbing, cyanosis, or edema PSYCH: Normally interactive. Cooperative during the interview. Pleasant. Friendly and conversant. Not anxious or depressed appearing. Normal, full affect.  Range of motion at  the waist: Flexion: normal Extension: normal Lateral bending: normal Rotation: all normal  No echymosis or edema Rises to examination table with no difficulty Gait: non antalgic  Inspection/Deformity: N Paraspinus Tenderness: normal  B Ankle Dorsiflexion (L5,4): 5/5 B Great Toe Dorsiflexion (L5,4): 5/5 Heel Walk (L5): WNL Toe Walk (S1): WNL Rise/Squat (L4): WNL  SENSORY B Medial Foot (L4): WNL B Dorsum (L5): WNL B Lateral (S1): WNL Light Touch: WNL Pinprick: WNL  REFLEXES Knee (L4): 2+ Ankle (S1): 2+  B SLR, seated: neg B SLR, supine: neg B FABER: neg B Reverse FABER: neg B Log Roll: neg B Stork: NT B Sciatic Notch: NT  HIP EXAM: SIDE: b ROM: Abduction, Flexion, Internal and External range of motion: full Pain  with terminal IROM and EROM: no GTB: Mild TTP on R and more superior near gluteus medius insertion SLR: NEG Knees: No effusion FABER: NT REVERSE FABER: NT, neg Piriformis: NT at direct palpation Str: flexion: 5/5 abduction: 4/5 on the R adduction: 5/5 Strength testing non-tender     Assessment & Plan:    Tendinopathy of right gluteus medius  Trochanteric bursitis of right hip  I reviewed with the patient the structures involved and how they related to diagnosis.   Patient was given a rehabilitation protocol emphasizing core rehab,  partcularly addressing abductor weakness, and stretching of the pelvic musculature, hips, and ITB.  GTB and insertional gluteus medius tendinopathy  Trochanteric Bursitis Injection, R Verbal consent obtained. Risks (including infection, potential atrophy), benefits, and alternatives reviewed. Greater trochanter sterilely prepped with Chloraprep. Ethyl Chloride used for anesthesia. 8 cc of Lidocaine 1% injected with 2 cc of 40 mg Depo-Medrol into trochanteric bursa at area of maximal tenderness at greater trochanter, and additionally 1/2 of total volume instilled superiorly along insertion of gluteus medius on the right. Needle taken to bone and / or adjacent to tendon, flows easily. Bursa massaged. No bleeding and no complications. Decreased pain after injection. Needle: 22 gauge 1 1/2 in  Patient Instructions  Hand Surgeons Recommended:  Dr. Theodis Sato, [facility], [facility]  Dr. Laurelyn Sickle, [facility]  Dr. Milly Jakob, [place]   No orders of the defined types were placed in this encounter.    New medications, updates to list, dose adjustments: No orders of the defined types were placed in this encounter.    Signed,  Maud Deed. Kaci Dillie, MD, [place] at [facility] [place] [address] Phone: [phone] Fax: [phone]      Medication List       This list is accurate as of: 07/22/13  8:08 PM.  Always use your most recent med list.               CALTRATE 600 PLUS-VIT D PO  Take 1 tablet by mouth 2 (two) times daily.     FLUoxetine 40 MG capsule  Commonly known as:  PROZAC  TAKE 1 CAPSULE TWICE DAILY     glucose blood test strip  (OneTouch Ultra test strips) use to check blood sugar four times daily     HUMALOG 100 UNIT/ML injection  Generic drug:  insulin lispro  112 units in the morning and 90 at dinner     hydrochlorothiazide 12.5 MG tablet  Commonly known as:  HYDRODIURIL  Take 1 tablet (12.5 mg total) by mouth daily.     Insulin Pen Needle 32G X 6 MM Misc  Use twice daily with byetta     lansoprazole 30 MG capsule  Commonly known as:  PREVACID  Take one by mouth before meal twice daily     levothyroxine 150 MCG tablet  Commonly known as:  SYNTHROID, LEVOTHROID  Take 150 mcg by mouth daily.     LORazepam 0.5 MG tablet  Commonly known as:  ATIVAN  Take 0.5 mg by mouth every 8 (eight) hours.     nitroGLYCERIN 0.4 MG/SPRAY spray  Commonly known as:  NITROLINGUAL  Place 1 spray under the tongue as needed. Chest pain     NOVOLOG 100 UNIT/ML injection  Generic drug:  insulin aspart  Inject into the skin. Sliding scale as needed     traMADol 50 MG tablet  Commonly known as:  ULTRAM  Take 1 tablet (50 mg total) by mouth every 6 (six) hours as needed for pain.     traZODone 100 MG tablet  Commonly known as:  DESYREL  Take 2 tablets (200 mg total) by mouth at bedtime.

## 2013-07-22 NOTE — Progress Notes (Signed)
Pre-visit discussion using our clinic review tool. No additional management support is needed unless otherwise documented below in the visit note.  

## 2013-08-04 ENCOUNTER — Other Ambulatory Visit: Payer: Self-pay | Admitting: Family Medicine

## 2013-08-07 ENCOUNTER — Ambulatory Visit (INDEPENDENT_AMBULATORY_CARE_PROVIDER_SITE_OTHER): Payer: Medicare Other | Admitting: Cardiovascular Disease

## 2013-08-07 ENCOUNTER — Encounter: Payer: Self-pay | Admitting: Cardiovascular Disease

## 2013-08-07 ENCOUNTER — Ambulatory Visit: Payer: Medicare Other | Admitting: Cardiovascular Disease

## 2013-08-07 VITALS — BP 140/58 | HR 59 | Ht 66.0 in | Wt 182.8 lb

## 2013-08-07 DIAGNOSIS — I2510 Atherosclerotic heart disease of native coronary artery without angina pectoris: Secondary | ICD-10-CM | POA: Diagnosis not present

## 2013-08-07 DIAGNOSIS — R0609 Other forms of dyspnea: Secondary | ICD-10-CM | POA: Diagnosis not present

## 2013-08-07 DIAGNOSIS — R079 Chest pain, unspecified: Secondary | ICD-10-CM | POA: Diagnosis not present

## 2013-08-07 DIAGNOSIS — R0989 Other specified symptoms and signs involving the circulatory and respiratory systems: Secondary | ICD-10-CM

## 2013-08-07 DIAGNOSIS — I10 Essential (primary) hypertension: Secondary | ICD-10-CM | POA: Diagnosis not present

## 2013-08-07 DIAGNOSIS — R0600 Dyspnea, unspecified: Secondary | ICD-10-CM

## 2013-08-07 DIAGNOSIS — E785 Hyperlipidemia, unspecified: Secondary | ICD-10-CM

## 2013-08-07 DIAGNOSIS — E119 Type 2 diabetes mellitus without complications: Secondary | ICD-10-CM

## 2013-08-07 NOTE — Progress Notes (Signed)
Patient ID: IMO CUMBIE, female    DOB: Feb 04, 1946, 68 y.o.   MRN: [id number]  HPI Comments: Ms. Wessner is a 68 year old woman with a history of hypertension, hyperlipidemia, diabetes, COPD, chronic cough and coronary artery disease, status post two-vessel angioplasty with Promus drug-eluting stents in May 2009, chronic CP and SOB.   Chronic loss of her hearing, worse on the right despite wearing hearing aids She continues to receive iron infusions under the guidance of Dr. Ma Hillock.   Admitted to [facility] 12/20- 05/28/2011 for acute respiratory failure due to bronchitis and COPD exacerbation. anemia 2 D echo- Normal EF , essentially normal., Placed on Levaquin, prednisone and short course of diuresis with IV lasix.  Hbg 8.5.  Due to CAD, transfused 1 Unit PRBCs.  DM- a1c 8.4.  Previous symptoms of edema and shortness of breath, sent to the emergency room for further evaluation.  given diuresis and steroids and reports mild improvement of her shortness of breath. BNP was low at 120,  chest x-ray showed interstitial prominence.  she was discharged home with prednisone and Lasix.  In followup today, she reports that she is doing well and has no complaints. Her weight has decreased from 191 pounds down to 182 pounds Sugars have improved. She does not want a statin. She does not do regular exercise  She has tried Lipitor and Crestor in the past and this caused leg pain  and she's not motivated Mild chronic shortness of breath with exertion sometimes. Occasional chest pain lasting for several seconds at a time. This has been there over the past year, coming on at rest, not with exertion . Prior anginal symptoms occurred with exertion. She reports that she does not have any of this .  Myoview 01/2009 EF 72% normal perfusion EKG shows normal sinus rhythm with rate 59 beats per minute with no significant ST or T wave changes        Outpatient Encounter Prescriptions as of 08/07/2013  Medication Sig  .  Calcium-Vitamin D (CALTRATE 600 PLUS-VIT D PO) Take 1 tablet by mouth 2 (two) times daily.   Marland Kitchen FLUoxetine (PROZAC) 40 MG capsule TAKE 1 CAPSULE TWICE DAILY  . glucose blood test strip (OneTouch Ultra test strips) use to check blood sugar four times daily  . hydrochlorothiazide (HYDRODIURIL) 12.5 MG tablet Take 1 tablet (12.5 mg total) by mouth daily.  . insulin aspart (NOVOLOG) 100 UNIT/ML injection Inject into the skin. Sliding scale as needed  . insulin lispro (HUMALOG) 100 UNIT/ML injection 112 units in the morning and 90 at dinner  . Insulin Pen Needle 32G X 6 MM MISC Use twice daily with byetta  . lansoprazole (PREVACID) 30 MG capsule Take one by mouth before meal twice daily  . levothyroxine (SYNTHROID, LEVOTHROID) 150 MCG tablet Take 150 mcg by mouth daily.    Marland Kitchen LORazepam (ATIVAN) 0.5 MG tablet Take 0.5 mg by mouth every 8 (eight) hours.  . nitroGLYCERIN (NITROLINGUAL) 0.4 MG/SPRAY spray Place 1 spray under the tongue as needed. Chest pain  . traMADol (ULTRAM) 50 MG tablet Take 1 tablet (50 mg total) by mouth every 6 (six) hours as needed for pain.  . traZODone (DESYREL) 100 MG tablet TAKE 2 TABLETS (200 MG     TOTAL) AT BEDTIME    Review of Systems  HENT: Positive for hearing loss.   Eyes: Negative.   Respiratory: Positive for shortness of breath.   Cardiovascular: Positive for chest pain.  Gastrointestinal: Negative.   Musculoskeletal: Negative.  Skin: Negative.   Neurological: Negative.   Psychiatric/Behavioral: Negative.   All other systems reviewed and are negative.  BP 140/58  Pulse 59  Ht 5\' 6"  (1.676 m)  Wt 182 lb 12 oz (82.895 kg)  BMI 29.51 kg/m2  Physical Exam  Nursing note and vitals reviewed. Constitutional: She is oriented to person, place, and time. She appears well-developed and well-nourished.  HENT:  Head: Normocephalic.  Nose: Nose normal.  Mouth/Throat: Oropharynx is clear and moist.  Eyes: Conjunctivae are normal. Pupils are equal, round, and  reactive to light.  Neck: Normal range of motion. Neck supple. No JVD present.  Cardiovascular: Normal rate, regular rhythm, S1 normal, S2 normal, normal heart sounds and intact distal pulses.  Exam reveals no gallop and no friction rub.   No murmur heard. Pulmonary/Chest: Effort normal and breath sounds normal. No respiratory distress. She has no wheezes. She has no rales. She exhibits no tenderness.  Abdominal: Soft. Bowel sounds are normal. She exhibits no distension. There is no tenderness.  Musculoskeletal: Normal range of motion. She exhibits no edema and no tenderness.  Lymphadenopathy:    She has no cervical adenopathy.  Neurological: She is alert and oriented to person, place, and time. Coordination normal.  Skin: Skin is warm and dry. No rash noted. No erythema.  Psychiatric: She has a normal mood and affect. Her behavior is normal. Judgment and thought content normal.    Assessment and Plan

## 2013-08-07 NOTE — Assessment & Plan Note (Signed)
Blood pressure is well controlled on today's visit. No changes made to the medications. 

## 2013-08-07 NOTE — Assessment & Plan Note (Addendum)
She has indicated again that she does not want a statin

## 2013-08-07 NOTE — Assessment & Plan Note (Signed)
Chest pain at rest over the past year, no symptoms with exertion. She does not think it is from her heart. Symptoms are different from prior anginal symptoms. She does not want any workup at this time.

## 2013-08-07 NOTE — Patient Instructions (Signed)
You are doing well. No medication changes were made.  Please call us if you have new issues that need to be addressed before your next appt.  Your physician wants you to follow-up in: 12 months.  You will receive a reminder letter in the mail two months in advance. If you don't receive a letter, please call our office to schedule the follow-up appointment. 

## 2013-08-07 NOTE — Assessment & Plan Note (Signed)
We have encouraged continued exercise, careful diet management in an effort to lose weight. Encouraged that she has lost 9 pounds over the past year

## 2013-08-07 NOTE — Assessment & Plan Note (Signed)
Chronic mild shortness of breath with exertion. Encouraged her to start a regular exercise program

## 2013-08-17 DIAGNOSIS — H2510 Age-related nuclear cataract, unspecified eye: Secondary | ICD-10-CM | POA: Diagnosis not present

## 2013-08-17 DIAGNOSIS — H269 Unspecified cataract: Secondary | ICD-10-CM | POA: Diagnosis not present

## 2013-08-18 DIAGNOSIS — H2510 Age-related nuclear cataract, unspecified eye: Secondary | ICD-10-CM | POA: Diagnosis not present

## 2013-08-20 ENCOUNTER — Other Ambulatory Visit: Payer: Self-pay | Admitting: Family Medicine

## 2013-08-21 NOTE — Telephone Encounter (Signed)
Pt requesting medication refill. Last ov 03/2013 with no future appts scheduled. pls advise 

## 2013-08-31 DIAGNOSIS — H2510 Age-related nuclear cataract, unspecified eye: Secondary | ICD-10-CM | POA: Diagnosis not present

## 2013-08-31 DIAGNOSIS — H269 Unspecified cataract: Secondary | ICD-10-CM | POA: Diagnosis not present

## 2013-09-02 ENCOUNTER — Other Ambulatory Visit: Payer: Self-pay | Admitting: Family Medicine

## 2013-09-11 ENCOUNTER — Telehealth: Payer: Self-pay

## 2013-09-11 ENCOUNTER — Encounter: Payer: Self-pay | Admitting: Family Medicine

## 2013-09-11 ENCOUNTER — Ambulatory Visit (INDEPENDENT_AMBULATORY_CARE_PROVIDER_SITE_OTHER): Payer: Medicare Other | Admitting: Family Medicine

## 2013-09-11 ENCOUNTER — Encounter: Payer: Self-pay | Admitting: *Deleted

## 2013-09-11 VITALS — BP 142/76 | HR 59 | Temp 97.8°F | Wt 180.5 lb

## 2013-09-11 DIAGNOSIS — G4700 Insomnia, unspecified: Secondary | ICD-10-CM | POA: Diagnosis not present

## 2013-09-11 DIAGNOSIS — D649 Anemia, unspecified: Secondary | ICD-10-CM | POA: Diagnosis not present

## 2013-09-11 DIAGNOSIS — R5381 Other malaise: Secondary | ICD-10-CM | POA: Diagnosis not present

## 2013-09-11 DIAGNOSIS — I2510 Atherosclerotic heart disease of native coronary artery without angina pectoris: Secondary | ICD-10-CM | POA: Diagnosis not present

## 2013-09-11 DIAGNOSIS — J301 Allergic rhinitis due to pollen: Secondary | ICD-10-CM | POA: Diagnosis not present

## 2013-09-11 DIAGNOSIS — R5383 Other fatigue: Secondary | ICD-10-CM | POA: Diagnosis not present

## 2013-09-11 DIAGNOSIS — IMO0001 Reserved for inherently not codable concepts without codable children: Secondary | ICD-10-CM | POA: Diagnosis not present

## 2013-09-11 LAB — CBC WITH DIFFERENTIAL/PLATELET
BASOS ABS: 0.1 10*3/uL (ref 0.0–0.1)
Basophils Relative: 1 % (ref 0–1)
Eosinophils Absolute: 0.2 10*3/uL (ref 0.0–0.7)
Eosinophils Relative: 2 % (ref 0–5)
HCT: 39.2 % (ref 36.0–46.0)
HEMOGLOBIN: 13.4 g/dL (ref 12.0–15.0)
LYMPHS PCT: 23 % (ref 12–46)
Lymphs Abs: 2.4 10*3/uL (ref 0.7–4.0)
MCH: 28.6 pg (ref 26.0–34.0)
MCHC: 34.2 g/dL (ref 30.0–36.0)
MCV: 83.8 fL (ref 78.0–100.0)
MONOS PCT: 8 % (ref 3–12)
Monocytes Absolute: 0.8 10*3/uL (ref 0.1–1.0)
NEUTROS ABS: 7 10*3/uL (ref 1.7–7.7)
NEUTROS PCT: 66 % (ref 43–77)
Platelets: 184 10*3/uL (ref 150–400)
RBC: 4.68 MIL/uL (ref 3.87–5.11)
RDW: 14.3 % (ref 11.5–15.5)
WBC: 10.6 10*3/uL — AB (ref 4.0–10.5)

## 2013-09-11 MED ORDER — TRAZODONE HCL 100 MG PO TABS: ORAL_TABLET | ORAL | Status: DC

## 2013-09-11 MED ORDER — ALBUTEROL SULFATE HFA 108 (90 BASE) MCG/ACT IN AERS: 2.0000 | INHALATION_SPRAY | Freq: Four times a day (QID) | RESPIRATORY_TRACT | Status: DC | PRN

## 2013-09-11 NOTE — Patient Instructions (Signed)
Good to see you. We will call you with your lab results.   

## 2013-09-11 NOTE — Assessment & Plan Note (Signed)
Well controlled on current dose of Trazadone.  No changes.

## 2013-09-11 NOTE — Assessment & Plan Note (Signed)
Will check labs today. Exam benign. Orders Placed This Encounter  Procedures  . Hemoglobin A1c  . TSH  . T4, Free  . Vitamin B12  . Vitamin D, 25-hydroxy  . CBC with Differential  . Iron

## 2013-09-11 NOTE — Progress Notes (Signed)
Patient ID: [name], female    DOB: [birth year], 68 y.o.   MRN: [id number]  HPI Comments: [name] is a 68 year old woman with a history of hypertension, hyperlipidemia, diabetes, COPD, chronic cough and CAD here to discuss her medication.  Anxiety/depression- long term, chronic issue.  Intolerant of multiple antidepressants/anxiolytics-- see allergy list- she is unsure which are true allergies versus intolerances.  Most recently we tried to wean her off prozac and start celexa- she could not tolerate the celexa and started back on prozac.  Repeatedly refuses psychotherapy or referral to a psychiatrist.    She is taking Trazodone 200 mg qhs.  Sleeping better and feels less depressed she takes tihs.  Followed by Dr. Gabriel Carina for DM and hypothyroidism.  a1c in January 5.9!  She has been very fatigued past few days with some SOB.  No CP but had some wheezing or what felt like wheezing yesterday. Gets IV iron through Dr. Ma Hillock- has an appointment for next week.  Says she feels likes he does when her iron is low.   Previous TSH last checked by Dr. Gabriel Carina in 08/2012- wnl at 1.100. Compliant with her synthroid.    Patient Active Problem List   Diagnosis Date Noted  . Hyperlipidemia 08/07/2013  . Alopecia 03/06/2013  . Insomnia 02/06/2013  . GERD (gastroesophageal reflux disease) 01/19/2013  . Dysphagia, unspecified(787.20) 01/19/2013  . Low back pain 01/06/2013  . Dyspnea 11/17/2012  . Weakness 11/17/2012  . Abnormal MRI of head 09/05/2012  . Depression with anxiety 03/21/2012  . Extrinsic asthma 11/12/2011  . Anemia 06/07/2011  . PULMONARY NODULE, SOLITARY 01/25/2010  . UNSPECIFIED HYPOTHYROIDISM 08/10/2009  . MICROSCOPIC HEMATURIA 05/07/2009  . GOITER 03/02/2009  . HYPERTENSION, BENIGN 01/06/2009  . MEMORY LOSS 06/23/2008  . OBSTRUCTIVE SLEEP APNEA 12/25/2007  . CORONARY ARTERY DISEASE 11/11/2007  . DIABETES MELLITUS, TYPE II 09/09/2006  . HYPERCHOLESTEROLEMIA 09/09/2006  .  ALLERGIC RHINITIS 09/09/2006  . GERD 09/09/2006  . DIVERTICULOSIS, COLON 09/09/2006   Past Medical History  Diagnosis Date  . Allergic rhinitis   . Diabetes mellitus     type 2  . Diverticulitis of colon   . GERD (gastroesophageal reflux disease)   . Chronic depression   . Hypercholesterolemia     intolerance of statins and niaspan  . Apnea, sleep     mild, intolerant of cpap  . Coronary artery disease   . ACE-inhibitor cough   . Cataract   . Chronic headache   . Asthma, acute   . Hearing loss   . History of blood transfusion 2013   Past Surgical History  Procedure Laterality Date  . Cholecystectomy  2001  . Colectomy      lap sigmoid  . Tonsillectomy    . Bartholin gland cyst excision    . Tubal ligation    . Bladder suspension    . Breast cyst excision  1988    bilateral nonmalignant tumors, x3  . Vaginal delivery      3  . Thyroidectomy    . Abdominal hysterectomy w/ partial vaginactomy    . Appendectomy  1964  . Cardiac catheterization    . Carotid stent    . Colonoscopy  2014    polyps found, 2 clamped off.   History  Substance Use Topics  . Smoking status: Former Smoker -- 1.00 packs/day for 30 years    Types: Cigarettes    Quit date: 02/03/2008  . Smokeless tobacco: Never Used  . Alcohol Use:  No   Family History  Problem Relation Age of Onset  . Breast cancer Mother     breast  . Hypertension Father   . Cancer Father     mesothelioma  . Asthma Father   . Stroke Paternal Grandfather   . Heart disease     Allergies  Allergen Reactions  . Voltaren [Diclofenac Sodium] Shortness Of Breath  . Budesonide-Formoterol Fumarate     Shakiness, tremors  . Amitriptyline   . Atorvastatin   . Benadryl [Diphenhydramine]   . Bupropion Hcl   . Codeine Sulfate   . Gabapentin   . Latex   . Lisinopril     REACTION: cough  . Lorazepam      Causes double vision at highter than .5 mg dose  . Meperidine Hcl   . Metformin     REACTION: unspecified  .  Mirtazapine   . Mometasone Furoate     REACTION: Nause and vomiting  . Morphine Sulfate     REACTION: Chest pain  . Olanzapine   . Other     Beta Blockers, reaction unknown but listed  . Oxycodone-Acetaminophen   . Propoxyphene N-Acetaminophen   . Rosuvastatin     REACTION: Makes patient weak.  . Shellfish Allergy   . Venlafaxine     REACTION: unspecified  . Zolpidem Tartrate     REACTION: Jittery, diarrhea   Current Outpatient Prescriptions on File Prior to Visit  Medication Sig Dispense Refill  . Calcium-Vitamin D (CALTRATE 600 PLUS-VIT D PO) Take 1 tablet by mouth 2 (two) times daily.       Marland Kitchen FLUoxetine (PROZAC) 40 MG capsule TAKE 1 CAPSULE TWICE DAILY  180 capsule  0  . glucose blood test strip (OneTouch Ultra test strips) use to check blood sugar four times daily  100 each  11  . hydrochlorothiazide (HYDRODIURIL) 12.5 MG tablet Take 1 tablet (12.5 mg total) by mouth daily.  30 tablet  3  . insulin aspart (NOVOLOG) 100 UNIT/ML injection Inject into the skin. Sliding scale as needed      . insulin lispro (HUMALOG) 100 UNIT/ML injection 112 units in the morning and 90 at dinner      . Insulin Pen Needle 32G X 6 MM MISC Use twice daily with byetta  100 each  5  . lansoprazole (PREVACID) 30 MG capsule Take one by mouth before meal twice daily  180 capsule  3  . levothyroxine (SYNTHROID, LEVOTHROID) 150 MCG tablet Take 150 mcg by mouth daily.        Marland Kitchen LORazepam (ATIVAN) 0.5 MG tablet Take 0.5 mg by mouth every 8 (eight) hours.      . nitroGLYCERIN (NITROLINGUAL) 0.4 MG/SPRAY spray Place 1 spray under the tongue as needed. Chest pain  12 g  3  . traMADol (ULTRAM) 50 MG tablet Take 1 tablet (50 mg total) by mouth every 6 (six) hours as needed for pain.  50 tablet  2   No current facility-administered medications on file prior to visit.   The PMH, PSH, Social History, Family History, Medications, and allergies have been reviewed in [facility], and have been updated if  relevant.     Review of Systems  See HPI  No blood in stool No HA No blurred vision  Physical Exam  BP 142/76  Pulse 59  Temp(Src) 97.8 F (36.6 C) (Oral)  Wt 180 lb 8 oz (81.874 kg)  SpO2 97%  Constitutional: She is oriented to person, place, and time. She  appears well-developed and well-nourished.  Resp:  CTA bilaterally NO wheezing CVS:  RRR Psychiatric: flat affect but she is more interactive and smiling today Not anxious appearing        Assessment and Plan

## 2013-09-11 NOTE — Progress Notes (Signed)
Pre visit review using our clinic review tool, if applicable. No additional management support is needed unless otherwise documented below in the visit note. 

## 2013-09-11 NOTE — Addendum Note (Signed)
Addended by: Ellamae Sia on: 09/11/2013 02:27 PM   Modules accepted: Orders

## 2013-09-11 NOTE — Telephone Encounter (Signed)
Pt called 4:40 pm and routed to CAN with complaint of some tightness in chest hx of asthma; pt said not in distress with breathing. Advised pt if condition changed or worsened prior to CAN call to go to UC.Pt said she already had appt scheduled with Dr Deborra Medina on 09/11/13. Pt voiced understanding. CAN fax placed in Dr Hulen Shouts in box. I spoke with pt this morning to offer earlier appt this AM and pt said she felt better this morning and wanted to wait to see Dr Deborra Medina this afternoon. Advised if condition changed or worsened prior to appt with Dr Deborra Medina to call our office.pt voiced understanding.

## 2013-09-12 LAB — T4, FREE: Free T4: 1.03 ng/dL (ref 0.80–1.80)

## 2013-09-12 LAB — VITAMIN D 25 HYDROXY (VIT D DEFICIENCY, FRACTURES): VIT D 25 HYDROXY: 68 ng/mL (ref 30–89)

## 2013-09-12 LAB — IRON: IRON: 41 ug/dL — AB (ref 42–145)

## 2013-09-12 LAB — VITAMIN B12: Vitamin B-12: 406 pg/mL (ref 211–911)

## 2013-09-12 LAB — TSH: TSH: 1.539 u[IU]/mL (ref 0.350–4.500)

## 2013-09-14 DIAGNOSIS — J301 Allergic rhinitis due to pollen: Secondary | ICD-10-CM | POA: Diagnosis not present

## 2013-09-16 ENCOUNTER — Ambulatory Visit: Payer: Self-pay | Admitting: Internal Medicine

## 2013-09-16 DIAGNOSIS — D509 Iron deficiency anemia, unspecified: Secondary | ICD-10-CM | POA: Diagnosis not present

## 2013-09-16 DIAGNOSIS — Z79899 Other long term (current) drug therapy: Secondary | ICD-10-CM | POA: Diagnosis not present

## 2013-09-16 LAB — IRON AND TIBC
IRON BIND. CAP.(TOTAL): 271 ug/dL (ref 250–450)
Iron Saturation: 17 %
Iron: 46 ug/dL — ABNORMAL LOW (ref 50–170)
UNBOUND IRON-BIND. CAP.: 225 ug/dL

## 2013-09-16 LAB — CANCER CENTER HEMOGLOBIN: HGB: 12.9 g/dL (ref 12.0–16.0)

## 2013-09-16 LAB — FERRITIN: FERRITIN (ARMC): 495 ng/mL — AB (ref 8–388)

## 2013-09-21 ENCOUNTER — Other Ambulatory Visit: Payer: Self-pay | Admitting: Family Medicine

## 2013-09-22 NOTE — Telephone Encounter (Signed)
Pt requesting medication refill. Last ov 09/2013 with no future appt scheduled. pls advise 

## 2013-09-22 NOTE — Telephone Encounter (Signed)
Lm on pts vm informing her Rx has been faxed to requested pharmacy.  

## 2013-09-29 ENCOUNTER — Telehealth: Payer: Self-pay

## 2013-09-29 NOTE — Telephone Encounter (Signed)
Pt request doctors names Dr Lorelei Pont gave pt 07/22/13 visit as hand surgeons; pt said thumb pain is no better. Gave pt Dr Herbie Baltimore Sypher,Dr Laurelyn Sickle and Dr Milly Jakob names. Pt voiced understanding.

## 2013-10-02 ENCOUNTER — Observation Stay: Payer: Self-pay | Admitting: Internal Medicine

## 2013-10-02 ENCOUNTER — Telehealth: Payer: Self-pay

## 2013-10-02 DIAGNOSIS — Z8249 Family history of ischemic heart disease and other diseases of the circulatory system: Secondary | ICD-10-CM | POA: Diagnosis not present

## 2013-10-02 DIAGNOSIS — Z9861 Coronary angioplasty status: Secondary | ICD-10-CM | POA: Diagnosis not present

## 2013-10-02 DIAGNOSIS — Z7982 Long term (current) use of aspirin: Secondary | ICD-10-CM | POA: Diagnosis not present

## 2013-10-02 DIAGNOSIS — Z888 Allergy status to other drugs, medicaments and biological substances status: Secondary | ICD-10-CM | POA: Diagnosis not present

## 2013-10-02 DIAGNOSIS — I10 Essential (primary) hypertension: Secondary | ICD-10-CM | POA: Diagnosis not present

## 2013-10-02 DIAGNOSIS — Z833 Family history of diabetes mellitus: Secondary | ICD-10-CM | POA: Diagnosis not present

## 2013-10-02 DIAGNOSIS — E119 Type 2 diabetes mellitus without complications: Secondary | ICD-10-CM | POA: Diagnosis not present

## 2013-10-02 DIAGNOSIS — R52 Pain, unspecified: Secondary | ICD-10-CM | POA: Diagnosis not present

## 2013-10-02 DIAGNOSIS — Z91040 Latex allergy status: Secondary | ICD-10-CM | POA: Diagnosis not present

## 2013-10-02 DIAGNOSIS — R0789 Other chest pain: Secondary | ICD-10-CM | POA: Diagnosis not present

## 2013-10-02 DIAGNOSIS — F329 Major depressive disorder, single episode, unspecified: Secondary | ICD-10-CM | POA: Diagnosis not present

## 2013-10-02 DIAGNOSIS — F17200 Nicotine dependence, unspecified, uncomplicated: Secondary | ICD-10-CM | POA: Diagnosis not present

## 2013-10-02 DIAGNOSIS — F3289 Other specified depressive episodes: Secondary | ICD-10-CM | POA: Diagnosis not present

## 2013-10-02 DIAGNOSIS — G4733 Obstructive sleep apnea (adult) (pediatric): Secondary | ICD-10-CM | POA: Diagnosis not present

## 2013-10-02 DIAGNOSIS — Z885 Allergy status to narcotic agent status: Secondary | ICD-10-CM | POA: Diagnosis not present

## 2013-10-02 DIAGNOSIS — R079 Chest pain, unspecified: Secondary | ICD-10-CM | POA: Diagnosis not present

## 2013-10-02 DIAGNOSIS — R0602 Shortness of breath: Secondary | ICD-10-CM | POA: Diagnosis not present

## 2013-10-02 DIAGNOSIS — Z91013 Allergy to seafood: Secondary | ICD-10-CM | POA: Diagnosis not present

## 2013-10-02 DIAGNOSIS — I2510 Atherosclerotic heart disease of native coronary artery without angina pectoris: Secondary | ICD-10-CM | POA: Diagnosis not present

## 2013-10-02 DIAGNOSIS — Z794 Long term (current) use of insulin: Secondary | ICD-10-CM | POA: Diagnosis not present

## 2013-10-02 DIAGNOSIS — E785 Hyperlipidemia, unspecified: Secondary | ICD-10-CM | POA: Diagnosis not present

## 2013-10-02 DIAGNOSIS — Z79899 Other long term (current) drug therapy: Secondary | ICD-10-CM | POA: Diagnosis not present

## 2013-10-02 DIAGNOSIS — E039 Hypothyroidism, unspecified: Secondary | ICD-10-CM | POA: Diagnosis not present

## 2013-10-02 DIAGNOSIS — Z823 Family history of stroke: Secondary | ICD-10-CM | POA: Diagnosis not present

## 2013-10-02 LAB — TROPONIN I
Troponin-I: 0.02 ng/mL
Troponin-I: 0.04 ng/mL

## 2013-10-02 LAB — BASIC METABOLIC PANEL
Anion Gap: 3 — ABNORMAL LOW (ref 7–16)
BUN: 13 mg/dL (ref 7–18)
Calcium, Total: 8.2 mg/dL — ABNORMAL LOW (ref 8.5–10.1)
Chloride: 106 mmol/L (ref 98–107)
Co2: 30 mmol/L (ref 21–32)
Creatinine: 0.79 mg/dL (ref 0.60–1.30)
EGFR (African American): >60
GLUCOSE: 138 mg/dL — AB (ref 65–99)
Osmolality: 280 (ref 275–301)
Potassium: 4.1 mmol/L (ref 3.5–5.1)
Sodium: 139 mmol/L (ref 136–145)

## 2013-10-02 LAB — CK TOTAL AND CKMB (NOT AT ARMC)
CK, Total: 91 U/L
CK-MB: 1.2 ng/mL (ref 0.5–3.6)

## 2013-10-02 LAB — CBC
HCT: 36.5 % (ref 35.0–47.0)
HGB: 12.2 g/dL (ref 12.0–16.0)
MCH: 28.5 pg (ref 26.0–34.0)
MCHC: 33.5 g/dL (ref 32.0–36.0)
MCV: 85 fL (ref 80–100)
PLATELETS: 167 10*3/uL (ref 150–440)
RBC: 4.29 10*6/uL (ref 3.80–5.20)
RDW: 14.3 % (ref 11.5–14.5)
WBC: 9.6 10*3/uL (ref 3.6–11.0)

## 2013-10-02 LAB — PROTIME-INR
INR: 1.1
Prothrombin Time: 13.7 secs (ref 11.5–14.7)

## 2013-10-02 LAB — PRO B NATRIURETIC PEPTIDE: B-TYPE NATIURETIC PEPTID: 290 pg/mL — AB (ref 0–125)

## 2013-10-02 NOTE — Telephone Encounter (Signed)
Pt called and states she is having chest pain and "it was so bad I had to use the spray"

## 2013-10-02 NOTE — Telephone Encounter (Signed)
Spoke w/ pt.  She reports 10/10 chest pain, that was partially relieved to a 3/10 10-15 mins after 1 nitro spray. Reports the pain is located in the center of her chest and radiates to her shoulders.  Reports "a little shortness of breath", denies diaphoresis or nausea.  Attempted to discuss symptoms w/ pt, but she is quite verbal stating that this is an emergency and she needs to see Dr. Rockey Situ immediately. Advised her that we cannot treat her in the office and if she feels that this pain is different from her h/o angina, she will need to seek immediate attention. Pt states that her pain is similar to when she had a previous "blockage" and she needs to be seen now.  Advised her to call 911 or have someone take her to the ED.  She states that her husband is with her and he will drive her to the ED, but "only if Dr. Rockey Situ will meet me there". Advised pt that Dr. Rockey Situ is seeing pts in clinic and that he will see her in the hospital if he is paged. Pt's husband got on the phone asking when clinic is over.   Advised him to take pt immediately to ED and call if we can be of further assistance.

## 2013-10-03 DIAGNOSIS — I2510 Atherosclerotic heart disease of native coronary artery without angina pectoris: Secondary | ICD-10-CM | POA: Diagnosis not present

## 2013-10-03 DIAGNOSIS — I359 Nonrheumatic aortic valve disorder, unspecified: Secondary | ICD-10-CM | POA: Diagnosis not present

## 2013-10-03 DIAGNOSIS — R079 Chest pain, unspecified: Secondary | ICD-10-CM | POA: Diagnosis not present

## 2013-10-03 DIAGNOSIS — E119 Type 2 diabetes mellitus without complications: Secondary | ICD-10-CM | POA: Diagnosis not present

## 2013-10-03 LAB — CBC WITH DIFFERENTIAL/PLATELET
Basophil #: 0.1 10*3/uL (ref 0.0–0.1)
Basophil %: 1.1 %
EOS ABS: 0.3 10*3/uL (ref 0.0–0.7)
EOS PCT: 3.6 %
HCT: 38.6 % (ref 35.0–47.0)
HGB: 12.6 g/dL (ref 12.0–16.0)
LYMPHS PCT: 27 %
Lymphocyte #: 2.2 10*3/uL (ref 1.0–3.6)
MCH: 27.7 pg (ref 26.0–34.0)
MCHC: 32.6 g/dL (ref 32.0–36.0)
MCV: 85 fL (ref 80–100)
Monocyte #: 0.6 x10 3/mm (ref 0.2–0.9)
Monocyte %: 7.7 %
NEUTROS ABS: 5 10*3/uL (ref 1.4–6.5)
NEUTROS PCT: 60.6 %
Platelet: 169 10*3/uL (ref 150–440)
RBC: 4.55 10*6/uL (ref 3.80–5.20)
RDW: 14.7 % — ABNORMAL HIGH (ref 11.5–14.5)
WBC: 8.2 10*3/uL (ref 3.6–11.0)

## 2013-10-03 LAB — BASIC METABOLIC PANEL
ANION GAP: 8 (ref 7–16)
BUN: 13 mg/dL (ref 7–18)
Calcium, Total: 8.5 mg/dL (ref 8.5–10.1)
Chloride: 107 mmol/L (ref 98–107)
Co2: 26 mmol/L (ref 21–32)
Creatinine: 0.61 mg/dL (ref 0.60–1.30)
EGFR (African American): >60
EGFR (Non-African Amer.): >60
Glucose: 114 mg/dL — ABNORMAL HIGH (ref 65–99)
Osmolality: 282 (ref 275–301)
Potassium: 3.9 mmol/L (ref 3.5–5.1)
Sodium: 141 mmol/L (ref 136–145)

## 2013-10-03 LAB — TROPONIN I
Troponin-I: 0.03 ng/mL
Troponin-I: 0.08 ng/mL — ABNORMAL HIGH

## 2013-10-03 LAB — HEMOGLOBIN A1C: HEMOGLOBIN A1C: 6.3 % (ref 4.2–6.3)

## 2013-10-03 LAB — LIPID PANEL
Cholesterol: 211 mg/dL — ABNORMAL HIGH (ref 0–200)
HDL Cholesterol: 22 mg/dL — ABNORMAL LOW (ref 40–60)
Ldl Cholesterol, Calc: 135 mg/dL — ABNORMAL HIGH (ref 0–100)
TRIGLYCERIDES: 272 mg/dL — AB (ref 0–200)
VLDL Cholesterol, Calc: 54 mg/dL — ABNORMAL HIGH (ref 5–40)

## 2013-10-03 LAB — MAGNESIUM: Magnesium: 2 mg/dL

## 2013-10-03 LAB — CK TOTAL AND CKMB (NOT AT ARMC)
CK, Total: 86 U/L
CK-MB: 1.3 ng/mL (ref 0.5–3.6)

## 2013-10-03 LAB — TSH: Thyroid Stimulating Horm: 2.35 u[IU]/mL

## 2013-10-04 ENCOUNTER — Ambulatory Visit: Payer: Self-pay | Admitting: Internal Medicine

## 2013-10-04 DIAGNOSIS — Z48812 Encounter for surgical aftercare following surgery on the circulatory system: Secondary | ICD-10-CM | POA: Diagnosis not present

## 2013-10-04 DIAGNOSIS — I2510 Atherosclerotic heart disease of native coronary artery without angina pectoris: Secondary | ICD-10-CM

## 2013-10-05 ENCOUNTER — Telehealth: Payer: Self-pay | Admitting: Family Medicine

## 2013-10-05 NOTE — Telephone Encounter (Signed)
If nitroglycerin helped, I do want her to see Dr. Rockey Situ whom she is established with.  She can certainly see me here as well, but I would advise seeing cardiology for this.  Can you please call Dr. Donivan Scull office to make an appointment for her?

## 2013-10-05 NOTE — Telephone Encounter (Signed)
Confirmed with patient by telephone that she has an appointment with Dr. Rockey Situ on Wednesday.

## 2013-10-05 NOTE — Telephone Encounter (Signed)
Patient will see Dr Rockey Situ on this Wednesday 10/07/13.

## 2013-10-05 NOTE — Telephone Encounter (Signed)
Thank you [name].  I think that is best.

## 2013-10-05 NOTE — Telephone Encounter (Signed)
Patient was seen at [facility] on 10/03/13 and was discharged on 10/04/13.  Patient was seen for chest pain.  They did a heart function test and it was fine.  Patient said she had chest pain yesterday after she came home and took Nitroglycerin and that helped.  Patient said she's been having chest pain for months, but it has gotten worse.  Patient called Dr.Gollan's office to schedule an appointment they told her to make an appointment with Dr.Aron.  Dr. Deborra Medina doesn't have a 1/2 hour appointment available this week.  Please advise.  If patient's not home when you call,leave a message on her voice mail.

## 2013-10-07 ENCOUNTER — Encounter (INDEPENDENT_AMBULATORY_CARE_PROVIDER_SITE_OTHER): Payer: Self-pay

## 2013-10-07 ENCOUNTER — Ambulatory Visit (INDEPENDENT_AMBULATORY_CARE_PROVIDER_SITE_OTHER): Payer: Medicare Other | Admitting: Cardiovascular Disease

## 2013-10-07 ENCOUNTER — Encounter: Payer: Self-pay | Admitting: Cardiovascular Disease

## 2013-10-07 VITALS — BP 180/60 | HR 62 | Ht 66.0 in | Wt 183.2 lb

## 2013-10-07 DIAGNOSIS — I2510 Atherosclerotic heart disease of native coronary artery without angina pectoris: Secondary | ICD-10-CM

## 2013-10-07 DIAGNOSIS — I10 Essential (primary) hypertension: Secondary | ICD-10-CM

## 2013-10-07 DIAGNOSIS — E119 Type 2 diabetes mellitus without complications: Secondary | ICD-10-CM

## 2013-10-07 DIAGNOSIS — E7800 Pure hypercholesterolemia, unspecified: Secondary | ICD-10-CM

## 2013-10-07 DIAGNOSIS — R079 Chest pain, unspecified: Secondary | ICD-10-CM | POA: Diagnosis not present

## 2013-10-07 MED ORDER — ISOSORBIDE MONONITRATE ER 30 MG PO TB24: 30.0000 mg | ORAL_TABLET | Freq: Every day | ORAL | Status: DC

## 2013-10-07 NOTE — Assessment & Plan Note (Signed)
We have encouraged continued exercise, careful diet management in an effort to lose weight. 

## 2013-10-07 NOTE — Patient Instructions (Addendum)
[facility] Cardiac Cath Instructions   You are scheduled for a Cardiac Cath on:_____Thursday, May 14________  Please arrive at __6:30am___am on the day of your procedure  You will need to pre-register prior to the day of your procedure.  Enter through the [employer] at [facility].  Registration is the first desk on your right.  Please take the procedure order we have given you in order to be registered appropriately  Do not eat/drink anything after midnight  Someone will need to drive you home  It is recommended someone be with you for the first 24 hours after your procedure  Wear clothes that are easy to get on/off and wear slip on shoes if possible   Medications bring a current list of all medications with you  _X_ Do not take these medications the day of your procedure:_______HCTZ______________________ _X_ Take half of your regular insulin dose the morning of your procedure  Day of your procedure: Arrive at the [place] entrance.  Free valet service is available.  After entering the [place] please check-in at the registration desk (1st desk on your right) to receive your armband. After receiving your armband someone will escort you to the cardiac cath/special procedures waiting area.  The usual length of stay after your procedure is about 2 to 3 hours.  This can vary.  If you have any questions, please call our office at [phone], or you may call the cardiac cath lab at [facility] directly at [phone]

## 2013-10-07 NOTE — Progress Notes (Addendum)
Patient ID: [name], female    DOB: 30-Nov-1945, 68 y.o.   MRN: [id number]  HPI Comments: [name] is a 68 year old woman with a history of hypertension, hyperlipidemia, diabetes, COPD, chronic cough and coronary artery disease, status post two-vessel angioplasty with Promus drug-eluting stents in May 2009 stent to her RCA and mid circumflex, chronic CP and SOB.   Chronic loss of her hearing, worse on the right despite wearing hearing aids She continues to receive iron infusions under the guidance of Dr. Ma Hillock. She presents for routine followup after recent hospital admission for chest pain In the past she has not wanted a statin  She reports that she was in the hospital Oct 02, 2013 with discharge on 10/03/2013. She ruled out with negative cardiac enzymes. Echocardiogram showed ejection fraction 62-13%, diastolic dysfunction, moderate aortic valve regurgitation, mild left ear, mildly elevated right ventricular systolic pressures Myoview was ordered on 10/03/2013.Read By Jefm Bryant (Dr. Josefa Half) This showed no ischemia, no wall motion abnormalities, no EKG changes concerning for ischemia Since her discharge, she has not felt well. She's continued to have chest pain. Symptoms are similar to how she felt prior to her stent placement. She is very anxious about her symptoms. They present with exertion, occasionally at rest. Reports that she was sweeping and she developed symptoms on may first. Symptoms started initially several weeks ago and have been getting worse  Admitted to [facility] 12/20- 05/28/2011 for acute respiratory failure due to bronchitis and COPD exacerbation. anemia 2 D echo- Normal EF , essentially normal., Placed on Levaquin, prednisone and short course of diuresis with IV lasix.  Hbg 8.5.  Due to CAD, transfused 1 Unit PRBCs.  DM- a1c 8.4.  Previous symptoms of edema and shortness of breath, sent to the emergency room for further evaluation.  given diuresis and steroids and reports  mild improvement of her shortness of breath. BNP was low at 120,  chest x-ray showed interstitial prominence.  she was discharged home with prednisone and Lasix.   She has tried Lipitor and Crestor in the past and this caused leg pain   Mild chronic shortness of breath with exertion sometimes.   Myoview 01/2009 EF 72% normal perfusion EKG shows normal sinus rhythm with rate 62 beats per minute with no significant ST or T wave changes        Outpatient Encounter Prescriptions as of 10/07/2013  Medication Sig  . albuterol (PROVENTIL HFA;VENTOLIN HFA) 108 (90 BASE) MCG/ACT inhaler Inhale 2 puffs into the lungs every 6 (six) hours as needed for wheezing or shortness of breath. As needed for wheezing  . Calcium-Vitamin D (CALTRATE 600 PLUS-VIT D PO) Take 1 tablet by mouth 2 (two) times daily.   Marland Kitchen FLUoxetine (PROZAC) 40 MG capsule TAKE 1 CAPSULE TWICE DAILY  . glucose blood test strip (OneTouch Ultra test strips) use to check blood sugar four times daily  . hydrochlorothiazide (HYDRODIURIL) 12.5 MG tablet Take 1 tablet (12.5 mg total) by mouth daily.  . insulin aspart (NOVOLOG) 100 UNIT/ML injection Inject into the skin. Sliding scale as needed  . insulin lispro (HUMALOG) 100 UNIT/ML injection 112 units in the morning and 90 at dinner  . Insulin Pen Needle 32G X 6 MM MISC Use twice daily with byetta  . lansoprazole (PREVACID) 30 MG capsule Take one by mouth before meal twice daily  . levothyroxine (SYNTHROID, LEVOTHROID) 150 MCG tablet Take 150 mcg by mouth daily.    Marland Kitchen LORazepam (ATIVAN) 0.5 MG tablet Take 0.5 mg  by mouth every 8 (eight) hours.  . nitroGLYCERIN (NITROLINGUAL) 0.4 MG/SPRAY spray Place 1 spray under the tongue as needed. Chest pain  . traMADol (ULTRAM) 50 MG tablet TAKE ONE TABLET BY MOUTH EVERY SIX HOURS AS NEEDED  . traZODone (DESYREL) 100 MG tablet TAKE 2 TABLETS (200 MG     TOTAL) AT BEDTIME    Review of Systems  Constitutional: Negative.   HENT: Positive for hearing loss.    Eyes: Negative.   Respiratory: Positive for shortness of breath.   Cardiovascular: Positive for chest pain.  Gastrointestinal: Negative.   Endocrine: Negative.   Musculoskeletal: Negative.   Skin: Negative.   Allergic/Immunologic: Negative.   Neurological: Negative.   Hematological: Negative.   Psychiatric/Behavioral: Negative.   All other systems reviewed and are negative. BP 180/60  Pulse 62  Ht 5\' 6"  (1.676 m)  Wt 183 lb 4 oz (83.122 kg)  BMI 29.59 kg/m2  Physical Exam  Nursing note and vitals reviewed. Constitutional: She is oriented to person, place, and time. She appears well-developed and well-nourished.  HENT:  Head: Normocephalic.  Nose: Nose normal.  Mouth/Throat: Oropharynx is clear and moist.  Eyes: Conjunctivae are normal. Pupils are equal, round, and reactive to light.  Neck: Normal range of motion. Neck supple. No JVD present.  Cardiovascular: Normal rate, regular rhythm, S1 normal, S2 normal, normal heart sounds and intact distal pulses.  Exam reveals no gallop and no friction rub.   No murmur heard. Pulmonary/Chest: Effort normal and breath sounds normal. No respiratory distress. She has no wheezes. She has no rales. She exhibits no tenderness.  Abdominal: Soft. Bowel sounds are normal. She exhibits no distension. There is no tenderness.  Musculoskeletal: Normal range of motion. She exhibits no edema and no tenderness.  Lymphadenopathy:    She has no cervical adenopathy.  Neurological: She is alert and oriented to person, place, and time. Coordination normal.  Skin: Skin is warm and dry. No rash noted. No erythema.  Psychiatric: She has a normal mood and affect. Her behavior is normal. Judgment and thought content normal.    Assessment and Plan

## 2013-10-07 NOTE — Assessment & Plan Note (Addendum)
She reports worsening chest pain. Despite her stress test showing no ischemia, she is concerned about her angina and is requesting a cardiac catheterization. She reports symptoms are similar to prior anginal symptoms when she had stents placed 2009. After long discussion, we will schedule a cardiac catheterization as soon as possible. Isosorbide mononitrate 30 mg added today  Percentage of stenosis in each vessel if known:  unknown  Ejection fraction from stress test or echo within the past 6 months: 50 to 55%  Result of any study/stress testing in the prior 6 months: No ischemia on stress 10/04/2103  Anginal status Including stable, unstable, non-STEMI, or anginal equivalent symptoms: Unstable angina  Anginal CCS of 1 through 4: 3  ACS, yes or no: no  NYHA classification if CHF history or in CHF: none

## 2013-10-07 NOTE — Addendum Note (Signed)
Addended by: Minna Merritts on: 10/07/2013 05:01 PM   Modules accepted: Level of Service

## 2013-10-07 NOTE — Assessment & Plan Note (Signed)
Blood pressure is well controlled on today's visit. We will add isosorbide mononitrate 30 mg daily for angina

## 2013-10-07 NOTE — Assessment & Plan Note (Signed)
Currently not on a statin. This has been her choice based on previous visits

## 2013-10-08 ENCOUNTER — Inpatient Hospital Stay (HOSPITAL_COMMUNITY)
Admission: EM | Admit: 2013-10-08 | Discharge: 2013-10-20 | DRG: 217 | Disposition: A | Discharge status: Home Health | Payer: Medicare Other | Attending: Surgery | Admitting: Surgery

## 2013-10-08 ENCOUNTER — Telehealth: Payer: Self-pay

## 2013-10-08 ENCOUNTER — Encounter (HOSPITAL_COMMUNITY): Payer: Self-pay | Admitting: Emergency Medicine

## 2013-10-08 ENCOUNTER — Emergency Department (HOSPITAL_COMMUNITY): Payer: Medicare Other

## 2013-10-08 DIAGNOSIS — D500 Iron deficiency anemia secondary to blood loss (chronic): Secondary | ICD-10-CM | POA: Diagnosis not present

## 2013-10-08 DIAGNOSIS — K219 Gastro-esophageal reflux disease without esophagitis: Secondary | ICD-10-CM | POA: Diagnosis present

## 2013-10-08 DIAGNOSIS — R51 Headache: Secondary | ICD-10-CM | POA: Diagnosis present

## 2013-10-08 DIAGNOSIS — J90 Pleural effusion, not elsewhere classified: Secondary | ICD-10-CM | POA: Diagnosis not present

## 2013-10-08 DIAGNOSIS — J398 Other specified diseases of upper respiratory tract: Secondary | ICD-10-CM | POA: Diagnosis not present

## 2013-10-08 DIAGNOSIS — R059 Cough, unspecified: Secondary | ICD-10-CM | POA: Diagnosis present

## 2013-10-08 DIAGNOSIS — Z9861 Coronary angioplasty status: Secondary | ICD-10-CM

## 2013-10-08 DIAGNOSIS — I10 Essential (primary) hypertension: Secondary | ICD-10-CM | POA: Diagnosis not present

## 2013-10-08 DIAGNOSIS — I459 Conduction disorder, unspecified: Secondary | ICD-10-CM | POA: Diagnosis present

## 2013-10-08 DIAGNOSIS — I359 Nonrheumatic aortic valve disorder, unspecified: Secondary | ICD-10-CM | POA: Diagnosis present

## 2013-10-08 DIAGNOSIS — E039 Hypothyroidism, unspecified: Secondary | ICD-10-CM | POA: Diagnosis present

## 2013-10-08 DIAGNOSIS — G4733 Obstructive sleep apnea (adult) (pediatric): Secondary | ICD-10-CM | POA: Diagnosis present

## 2013-10-08 DIAGNOSIS — Z794 Long term (current) use of insulin: Secondary | ICD-10-CM | POA: Diagnosis not present

## 2013-10-08 DIAGNOSIS — J811 Chronic pulmonary edema: Secondary | ICD-10-CM | POA: Diagnosis present

## 2013-10-08 DIAGNOSIS — F17200 Nicotine dependence, unspecified, uncomplicated: Secondary | ICD-10-CM | POA: Diagnosis present

## 2013-10-08 DIAGNOSIS — Z952 Presence of prosthetic heart valve: Secondary | ICD-10-CM

## 2013-10-08 DIAGNOSIS — E662 Morbid (severe) obesity with alveolar hypoventilation: Secondary | ICD-10-CM | POA: Diagnosis present

## 2013-10-08 DIAGNOSIS — Z951 Presence of aortocoronary bypass graft: Secondary | ICD-10-CM

## 2013-10-08 DIAGNOSIS — I498 Other specified cardiac arrhythmias: Secondary | ICD-10-CM | POA: Diagnosis present

## 2013-10-08 DIAGNOSIS — J988 Other specified respiratory disorders: Secondary | ICD-10-CM | POA: Diagnosis not present

## 2013-10-08 DIAGNOSIS — R05 Cough: Secondary | ICD-10-CM | POA: Diagnosis present

## 2013-10-08 DIAGNOSIS — I200 Unstable angina: Secondary | ICD-10-CM | POA: Diagnosis not present

## 2013-10-08 DIAGNOSIS — E785 Hyperlipidemia, unspecified: Secondary | ICD-10-CM | POA: Diagnosis not present

## 2013-10-08 DIAGNOSIS — E119 Type 2 diabetes mellitus without complications: Secondary | ICD-10-CM | POA: Diagnosis present

## 2013-10-08 DIAGNOSIS — J449 Chronic obstructive pulmonary disease, unspecified: Secondary | ICD-10-CM | POA: Diagnosis present

## 2013-10-08 DIAGNOSIS — R079 Chest pain, unspecified: Secondary | ICD-10-CM | POA: Diagnosis not present

## 2013-10-08 DIAGNOSIS — F341 Dysthymic disorder: Secondary | ICD-10-CM | POA: Diagnosis present

## 2013-10-08 DIAGNOSIS — E7800 Pure hypercholesterolemia, unspecified: Secondary | ICD-10-CM | POA: Diagnosis present

## 2013-10-08 DIAGNOSIS — R0989 Other specified symptoms and signs involving the circulatory and respiratory systems: Secondary | ICD-10-CM | POA: Diagnosis not present

## 2013-10-08 DIAGNOSIS — F418 Other specified anxiety disorders: Secondary | ICD-10-CM | POA: Diagnosis present

## 2013-10-08 DIAGNOSIS — J984 Other disorders of lung: Secondary | ICD-10-CM | POA: Diagnosis not present

## 2013-10-08 DIAGNOSIS — Z01810 Encounter for preprocedural cardiovascular examination: Secondary | ICD-10-CM | POA: Diagnosis not present

## 2013-10-08 DIAGNOSIS — J9819 Other pulmonary collapse: Secondary | ICD-10-CM | POA: Diagnosis not present

## 2013-10-08 DIAGNOSIS — I739 Peripheral vascular disease, unspecified: Secondary | ICD-10-CM | POA: Diagnosis present

## 2013-10-08 DIAGNOSIS — I2510 Atherosclerotic heart disease of native coronary artery without angina pectoris: Secondary | ICD-10-CM | POA: Diagnosis not present

## 2013-10-08 DIAGNOSIS — J4489 Other specified chronic obstructive pulmonary disease: Secondary | ICD-10-CM | POA: Diagnosis present

## 2013-10-08 HISTORY — DX: Anxiety disorder, unspecified: F41.9

## 2013-10-08 LAB — BASIC METABOLIC PANEL
BUN: 14 mg/dL (ref 6–23)
CALCIUM: 8.3 mg/dL — AB (ref 8.4–10.5)
CO2: 21 meq/L (ref 19–32)
CREATININE: 0.68 mg/dL (ref 0.50–1.10)
Chloride: 102 mEq/L (ref 96–112)
GFR calc Af Amer: >90 mL/min (ref >90)
GFR calc non Af Amer: 87 mL/min — ABNORMAL LOW (ref >90)
GLUCOSE: 203 mg/dL — AB (ref 70–99)
Potassium: 4.1 mEq/L (ref 3.7–5.3)
Sodium: 138 mEq/L (ref 137–147)

## 2013-10-08 LAB — CBC
HEMATOCRIT: 36.1 % (ref 36.0–46.0)
HEMOGLOBIN: 12 g/dL (ref 12.0–15.0)
MCH: 28.4 pg (ref 26.0–34.0)
MCHC: 33.2 g/dL (ref 30.0–36.0)
MCV: 85.5 fL (ref 78.0–100.0)
PLATELETS: 170 10*3/uL (ref 150–400)
RBC: 4.22 MIL/uL (ref 3.87–5.11)
RDW: 14.1 % (ref 11.5–15.5)
WBC: 9.9 10*3/uL (ref 4.0–10.5)

## 2013-10-08 LAB — I-STAT TROPONIN, ED
Troponin i, poc: 0 ng/mL (ref 0.00–0.08)
Troponin i, poc: 0.02 ng/mL (ref 0.00–0.08)

## 2013-10-08 LAB — TROPONIN I

## 2013-10-08 LAB — PRO B NATRIURETIC PEPTIDE: PRO B NATRI PEPTIDE: 225.2 pg/mL — AB (ref 0–125)

## 2013-10-08 MED ORDER — NITROGLYCERIN 0.4 MG/SPRAY TL SOLN
1.0000 | Status: DC | PRN
  Administered 2013-10-12 (×3): 1 via SUBLINGUAL
  Filled 2013-10-08: qty 4.9

## 2013-10-08 MED ORDER — ISOSORBIDE MONONITRATE ER 30 MG PO TB24
30.0000 mg | ORAL_TABLET | Freq: Every day | ORAL | Status: DC
  Filled 2013-10-08 (×2): qty 1

## 2013-10-08 MED ORDER — HEPARIN BOLUS VIA INFUSION
4000.0000 [IU] | Freq: Once | INTRAVENOUS | Status: AC
  Administered 2013-10-08: 4000 [IU] via INTRAVENOUS
  Filled 2013-10-08: qty 4000

## 2013-10-08 MED ORDER — TRAMADOL HCL 50 MG PO TABS
50.0000 mg | ORAL_TABLET | Freq: Four times a day (QID) | ORAL | Status: DC | PRN
  Administered 2013-10-09 – 2013-10-11 (×4): 50 mg via ORAL
  Filled 2013-10-08 (×4): qty 1

## 2013-10-08 MED ORDER — ASPIRIN EC 81 MG PO TBEC
81.0000 mg | DELAYED_RELEASE_TABLET | Freq: Every day | ORAL | Status: DC
  Filled 2013-10-08: qty 1

## 2013-10-08 MED ORDER — ASPIRIN EC 325 MG PO TBEC: 325.0000 mg | DELAYED_RELEASE_TABLET | Freq: Every day | ORAL | Status: DC

## 2013-10-08 MED ORDER — FLUOXETINE HCL 20 MG PO CAPS
40.0000 mg | ORAL_CAPSULE | Freq: Every day | ORAL | Status: DC
  Administered 2013-10-10 – 2013-10-11 (×2): 40 mg via ORAL
  Filled 2013-10-08 (×4): qty 2

## 2013-10-08 MED ORDER — TRAZODONE HCL 100 MG PO TABS
200.0000 mg | ORAL_TABLET | Freq: Every day | ORAL | Status: DC
  Administered 2013-10-08 – 2013-10-11 (×4): 200 mg via ORAL
  Filled 2013-10-08 (×3): qty 2
  Filled 2013-10-08: qty 4
  Filled 2013-10-08 (×2): qty 2

## 2013-10-08 MED ORDER — INSULIN ASPART 100 UNIT/ML ~~LOC~~ SOLN
0.0000 [IU] | Freq: Three times a day (TID) | SUBCUTANEOUS | Status: DC
  Administered 2013-10-10: 2 [IU] via SUBCUTANEOUS
  Administered 2013-10-10 – 2013-10-11 (×3): 3 [IU] via SUBCUTANEOUS

## 2013-10-08 MED ORDER — LORAZEPAM 0.5 MG PO TABS
0.5000 mg | ORAL_TABLET | Freq: Four times a day (QID) | ORAL | Status: DC | PRN
  Administered 2013-10-09 – 2013-10-12 (×6): 0.5 mg via ORAL
  Filled 2013-10-08 (×7): qty 1

## 2013-10-08 MED ORDER — HEPARIN (PORCINE) IN NACL 100-0.45 UNIT/ML-% IJ SOLN
1250.0000 [IU]/h | INTRAMUSCULAR | Status: DC
  Administered 2013-10-08 – 2013-10-09 (×2): 1000 [IU]/h via INTRAVENOUS
  Filled 2013-10-08 (×2): qty 250

## 2013-10-08 MED ORDER — INSULIN ASPART 100 UNIT/ML ~~LOC~~ SOLN: 0.0000 [IU] | Freq: Every day | SUBCUTANEOUS | Status: DC

## 2013-10-08 MED ORDER — ACETAMINOPHEN 325 MG PO TABS: 650.0000 mg | ORAL_TABLET | Freq: Once | ORAL | Status: DC

## 2013-10-08 MED ORDER — HEPARIN SODIUM (PORCINE) 5000 UNIT/ML IJ SOLN: 4000.0000 [IU] | Freq: Once | INTRAMUSCULAR | Status: DC

## 2013-10-08 MED ORDER — INSULIN GLARGINE 100 UNIT/ML ~~LOC~~ SOLN
40.0000 [IU] | Freq: Every day | SUBCUTANEOUS | Status: DC
  Administered 2013-10-09 – 2013-10-11 (×4): 40 [IU] via SUBCUTANEOUS
  Filled 2013-10-08 (×7): qty 0.4

## 2013-10-08 MED ORDER — INSULIN ASPART 100 UNIT/ML ~~LOC~~ SOLN
6.0000 [IU] | Freq: Three times a day (TID) | SUBCUTANEOUS | Status: DC
  Administered 2013-10-10 – 2013-10-11 (×4): 6 [IU] via SUBCUTANEOUS

## 2013-10-08 MED ORDER — ACETAMINOPHEN 500 MG PO TABS
1000.0000 mg | ORAL_TABLET | Freq: Once | ORAL | Status: AC
  Administered 2013-10-08: 1000 mg via ORAL
  Filled 2013-10-08: qty 2

## 2013-10-08 MED ORDER — LEVOTHYROXINE SODIUM 150 MCG PO TABS
150.0000 ug | ORAL_TABLET | Freq: Every day | ORAL | Status: DC
  Administered 2013-10-09 – 2013-10-12 (×4): 150 ug via ORAL
  Filled 2013-10-08 (×6): qty 1

## 2013-10-08 MED ORDER — HEPARIN (PORCINE) IN NACL 100-0.45 UNIT/ML-% IJ SOLN: 14.0000 [IU]/kg/h | Freq: Once | INTRAMUSCULAR | Status: DC

## 2013-10-08 MED ORDER — PANTOPRAZOLE SODIUM 40 MG PO TBEC
40.0000 mg | DELAYED_RELEASE_TABLET | Freq: Every day | ORAL | Status: DC
  Administered 2013-10-10 – 2013-10-11 (×2): 40 mg via ORAL
  Filled 2013-10-08 (×2): qty 1

## 2013-10-08 MED ORDER — SODIUM CHLORIDE 0.9 % IV SOLN
INTRAVENOUS | Status: DC
  Administered 2013-10-08: 75 mL/h via INTRAVENOUS
  Administered 2013-10-09: 01:00:00 via INTRAVENOUS

## 2013-10-08 MED ORDER — NITROGLYCERIN IN D5W 200-5 MCG/ML-% IV SOLN
2.0000 ug/min | Freq: Once | INTRAVENOUS | Status: AC
  Administered 2013-10-08: 5 ug/min via INTRAVENOUS
  Administered 2013-10-09: 30 ug/min via INTRAVENOUS
  Filled 2013-10-08: qty 250

## 2013-10-08 NOTE — ED Notes (Signed)
Pt states that she has been having chest pain off and on for over a month. States that she was admitted to [facility] and had an [place] and stress test done wchich were negative. States that she has an appt with Dr Regenia Skeeter in [place] for a heart Cath on Thursday.

## 2013-10-08 NOTE — ED Notes (Signed)
Pt with hx of CAD and 2 stent placements to ED c/o acute onset central chest pain after eating today at 1630.  Pt states she has been having intermittent chest pains since last Friday, but the pain is getting worse (today she broke out in a sweat and became nauseated).  PT took 1 spray of nitro spray and 1 isosorbide and called her cardiologist.  Dr told her to take a 2nd isosorbide and 2nd nitro spray.  Meds brought pain down to 4.

## 2013-10-08 NOTE — ED Provider Notes (Signed)
CSN: [id number]     Arrival date & time 10/08/13  81 History   First MD Initiated Contact with Patient 10/08/13 1838     Chief Complaint  Patient presents with  . Chest Pain     (Consider location/radiation/quality/duration/timing/severity/associated sxs/prior Treatment) Patient is a 68 y.o. female presenting with chest pain. The history is provided by the patient.  Chest Pain Associated symptoms: back pain   Associated symptoms: no abdominal pain, no fever, no headache, no nausea, no shortness of breath and not vomiting    patient's cardiologist is with [facility] cardiology in the [facility] area. Patient status post stents in 2009. Patient had the onset of constant chest pain starting at the 4:30 this afternoon. It is across both sides of her chest and radiates to the back on the left. Behind left shoulder blade. Patient had aspirin and had 2 nitroglycerin at home without any help. Patient's been having pain like this intermittently for over a week was admitted over the weekend [facility] had a rule out. Patient also had a stress test this week results were told to her as not showing anything significant however her cardiologist was planning to do a cardiac cath this Thursday. Not today but coming up.  Past Medical History  Diagnosis Date  . Allergic rhinitis   . Diverticulitis of colon   . GERD (gastroesophageal reflux disease)   . Chronic depression   . Hypercholesterolemia     intolerance of statins and niaspan  . Apnea, sleep     mild, intolerant of cpap  . ACE-inhibitor cough   . Cataract   . Chronic headache   . Asthma, acute   . Hearing loss   . History of blood transfusion 2013  . Coronary artery disease   . Diabetes mellitus     type 2   Past Surgical History  Procedure Laterality Date  . Cholecystectomy  2001  . Colectomy      lap sigmoid  . Tonsillectomy    . Bartholin gland cyst excision    . Tubal ligation    . Bladder suspension    . Breast cyst excision   1988    bilateral nonmalignant tumors, x3  . Vaginal delivery      3  . Thyroidectomy    . Abdominal hysterectomy w/ partial vaginactomy    . Appendectomy  1964  . Colonoscopy  2014    polyps found, 2 clamped off.  . Cardiac catheterization    . Coronary angioplasty  10/29/2007    Prox RCA & Mid Cx.   Family History  Problem Relation Age of Onset  . Breast cancer Mother     breast  . Hypertension Father   . Cancer Father     mesothelioma  . Asthma Father   . Stroke Paternal Grandfather   . Heart disease     History  Substance Use Topics  . Smoking status: Current Every Day Smoker -- 0.50 packs/day for 30 years    Types: Cigarettes    Last Attempt to Quit: 02/03/2008  . Smokeless tobacco: Never Used  . Alcohol Use: No   OB History   Grav Para Term Preterm Abortions TAB SAB Ect Mult Living   3 3        3      Obstetric Comments   1st Menstrual Cycle: 10 1st Pregnancy: 19     Review of Systems  Constitutional: Negative for fever.  HENT: Negative for congestion.   Eyes: Negative for  visual disturbance.  Respiratory: Negative for shortness of breath.   Cardiovascular: Positive for chest pain.  Gastrointestinal: Negative for nausea, vomiting and abdominal pain.  Genitourinary: Negative for dysuria.  Musculoskeletal: Positive for back pain.  Skin: Negative for rash.  Neurological: Negative for headaches.  Hematological: Does not bruise/bleed easily.  Psychiatric/Behavioral: Negative for confusion.      Allergies  Benadryl; Voltaren; Budesonide-formoterol fumarate; Amitriptyline; Ativan; Atorvastatin; Bupropion hcl; Codeine sulfate; Demerol; Gabapentin; Lisinopril; Meperidine hcl; Metformin; Mirtazapine; Mometasone furoate; Morphine sulfate; Olanzapine; Other; Oxycodone-acetaminophen; Propoxyphene n-acetaminophen; Rosuvastatin; Shellfish allergy; Venlafaxine; Zolpidem tartrate; and Latex  Home Medications   Prior to Admission medications   Medication Sig Start  Date End Date Taking? Authorizing Provider  albuterol (PROVENTIL HFA;VENTOLIN HFA) 108 (90 BASE) MCG/ACT inhaler Inhale 2 puffs into the lungs every 6 (six) hours as needed for wheezing or shortness of breath. As needed for wheezing 09/11/13  Yes Lucille Passy, MD  aspirin EC 325 MG tablet Take 1 tablet (325 mg total) by mouth daily. 10/08/13  Yes Minna Merritts, MD  Calcium-Vitamin D (CALTRATE 600 PLUS-VIT D PO) Take 2 tablets by mouth 2 (two) times daily.    Yes Historical Provider, MD  FLUoxetine (PROZAC) 40 MG capsule Take 40 mg by mouth 2 (two) times daily.   Yes Historical Provider, MD  insulin lispro protamine-lispro (HUMALOG 75/25 MIX) (75-25) 100 UNIT/ML SUSP injection Inject 50 Units into the skin 2 (two) times daily with a meal.   Yes Historical Provider, MD  isosorbide mononitrate (IMDUR) 30 MG 24 hr tablet Take 1 tablet (30 mg total) by mouth daily. 10/07/13  Yes Minna Merritts, MD  lansoprazole (PREVACID) 30 MG capsule Take one by mouth before meal twice daily 12/18/12  Yes Elsie Stain, MD  levothyroxine (SYNTHROID, LEVOTHROID) 150 MCG tablet Take 150 mcg by mouth daily.     Yes Historical Provider, MD  LORazepam (ATIVAN) 0.5 MG tablet Take 0.5 mg by mouth 2 (two) times daily.    Yes Historical Provider, MD  nitroGLYCERIN (NITROLINGUAL) 0.4 MG/SPRAY spray Place 1 spray under the tongue as needed. Chest pain 08/22/12  Yes Minna Merritts, MD  traMADol (ULTRAM) 50 MG tablet Take 50 mg by mouth every 6 (six) hours as needed for moderate pain.   Yes Historical Provider, MD  traZODone (DESYREL) 100 MG tablet Take 200 mg by mouth at bedtime.   Yes Historical Provider, MD   BP 120/43  Pulse 56  Temp(Src) 98.2 F (36.8 C) (Oral)  Resp 20  Ht 5\' 6"  (1.676 m)  Wt 182 lb (82.555 kg)  BMI 29.39 kg/m2  SpO2 94% Physical Exam  Nursing note and vitals reviewed. Constitutional: She is oriented to person, place, and time. She appears well-developed and well-nourished. No distress.  HENT:   Head: Normocephalic and atraumatic.  Mouth/Throat: Oropharynx is clear and moist.  Eyes: Conjunctivae and EOM are normal. Pupils are equal, round, and reactive to light.  Neck: Normal range of motion.  Cardiovascular: Regular rhythm and normal heart sounds.   No murmur heard. Pulmonary/Chest: Effort normal and breath sounds normal. No respiratory distress.  Abdominal: Soft. Bowel sounds are normal. There is no tenderness.  Musculoskeletal: Normal range of motion.  Neurological: She is alert and oriented to person, place, and time. No cranial nerve deficit. She exhibits normal muscle tone. Coordination normal.  Skin: Skin is warm. No rash noted.    ED Course  Procedures (including critical care time) Labs Review Labs Reviewed  BASIC METABOLIC PANEL -  Abnormal; Notable for the following:    Glucose, Bld 203 (*)    Calcium 8.3 (*)    GFR calc non Af Amer 87 (*)    All other components within normal limits  PRO B NATRIURETIC PEPTIDE - Abnormal; Notable for the following:    Pro B Natriuretic peptide (BNP) 225.2 (*)    All other components within normal limits  TROPONIN I  CBC  HEPARIN LEVEL (UNFRACTIONATED)  CBC  I-STAT TROPOININ, ED  I-STAT TROPOININ, ED   Results for orders placed during the hospital encounter of 83/38/25  BASIC METABOLIC PANEL      Result Value Ref Range   Sodium 138  137 - 147 mEq/L   Potassium 4.1  3.7 - 5.3 mEq/L   Chloride 102  96 - 112 mEq/L   CO2 21  19 - 32 mEq/L   Glucose, Bld 203 (*) 70 - 99 mg/dL   BUN 14  6 - 23 mg/dL   Creatinine, Ser 0.68  0.50 - 1.10 mg/dL   Calcium 8.3 (*) 8.4 - 10.5 mg/dL   GFR calc non Af Amer 87 (*) >90 mL/min   GFR calc Af Amer >90  >90 mL/min  TROPONIN I      Result Value Ref Range   Troponin I <0.30  <0.30 ng/mL  CBC      Result Value Ref Range   WBC 9.9  4.0 - 10.5 K/uL   RBC 4.22  3.87 - 5.11 MIL/uL   Hemoglobin 12.0  12.0 - 15.0 g/dL   HCT 36.1  36.0 - 46.0 %   MCV 85.5  78.0 - 100.0 fL   MCH 28.4  26.0  - 34.0 pg   MCHC 33.2  30.0 - 36.0 g/dL   RDW 14.1  11.5 - 15.5 %   Platelets 170  150 - 400 K/uL  PRO B NATRIURETIC PEPTIDE      Result Value Ref Range   Pro B Natriuretic peptide (BNP) 225.2 (*) 0 - 125 pg/mL  I-STAT TROPOININ, ED      Result Value Ref Range   Troponin i, poc 0.00  0.00 - 0.08 ng/mL   Comment 3           I-STAT TROPOININ, ED      Result Value Ref Range   Troponin i, poc 0.02  0.00 - 0.08 ng/mL   Comment 3              Imaging Review Dg Chest 2 View  10/08/2013   CLINICAL DATA:  Chest pain. Shortness of breath. Diaphoresis. Nausea. Diabetes.  EXAM: CHEST  2 VIEW  COMPARISON:  02/12/2011  FINDINGS: Heart size remains within normal limits. Mild central peribronchial thickening is stable. No evidence of acute infiltrate or pulmonary edema. No evidence of pleural effusion. No mass or lymphadenopathy identified.  IMPRESSION: Stable exam.  No active disease.   Electronically Signed   By: Earle Gell M.D.   On: 10/08/2013 18:23     EKG Interpretation   Date/Time:  Thursday Oct 08 2013 17:24:18 EDT Ventricular Rate:  61 PR Interval:  198 QRS Duration: 86 QT Interval:  452 QTC Calculation: 455 R Axis:   32 Text Interpretation:  Normal sinus rhythm Septal infarct , age  undetermined Abnormal ECG Artifact RESOLVED SINCE PREVIOUS Confirmed by  Rogene Houston  MD, Shynia Daleo [phone]) on 10/08/2013 8:17:46 PM      CRITICAL CARE Performed by: Mervin Kung Total critical care time: 30 Critical care time  was exclusive of separately billable procedures and treating other patients. Critical care was necessary to treat or prevent imminent or life-threatening deterioration. Critical care was time spent personally by me on the following activities: development of treatment plan with patient and/or surrogate as well as nursing, discussions with consultants, evaluation of patient's response to treatment, examination of patient, obtaining history from patient or surrogate, ordering and  performing treatments and interventions, ordering and review of laboratory studies, ordering and review of radiographic studies, pulse oximetry and re-evaluation of patient's condition.     MDM   Final diagnoses:  Unstable angina    Patient symptoms consistent with unstable angina. Patient has a prior stent. Patient initial cardiac marker negative. Patient started on heparin and IV nitroglycerin drip. Discussed with [facility]ology they will see and admit. Patient's pain on the nitroglycerin drip is gone from a 10 out of 10 to a 7/10 it is being titrated to try to resolve her pain. EKG without acute changes. Chest x-ray with no evidence of pneumonia pulmonary edema or pneumothorax.    Mervin Kung, MD 10/08/13 [phone]

## 2013-10-08 NOTE — ED Notes (Addendum)
Pt denies chest pain at this time, NTG decreased due to BP. Spoke with Dr Rogene Houston, goal to keep MAP >65

## 2013-10-08 NOTE — Progress Notes (Signed)
ANTICOAGULATION CONSULT NOTE - Initial Consult  Pharmacy Consult for heparin Indication: chest pain/ACS  Allergies  Allergen Reactions  . Benadryl [Diphenhydramine] Shortness Of Breath  . Voltaren [Diclofenac Sodium] Shortness Of Breath  . Budesonide-Formoterol Fumarate     Shakiness, tremors  . Amitriptyline Other (See Comments)    unknown  . Ativan [Lorazepam]      Causes double vision at highter than .5 mg dose  . Atorvastatin Other (See Comments)    unknown  . Bupropion Hcl Other (See Comments)    "cloud over me" "depressed like"  . Codeine Sulfate Other (See Comments)    Makes chest hurt  . Demerol [Meperidine] Other (See Comments)    unknown  . Gabapentin Other (See Comments)    unknown  . Lisinopril     REACTION: cough  . Meperidine Hcl Other (See Comments)    unknown  . Metformin     REACTION: unspecified  . Mirtazapine Other (See Comments)    unknown  . Mometasone Furoate     REACTION: Nause and vomiting  . Morphine Sulfate     REACTION: Chest pain  . Olanzapine Other (See Comments)    Unknown   . Other     Beta Blockers, reaction unknown but listed  . Oxycodone-Acetaminophen Other (See Comments)    Chest pain  . Propoxyphene N-Acetaminophen Other (See Comments)    Chest pain  . Rosuvastatin     REACTION: Makes patient weak.  . Shellfish Allergy Diarrhea  . Venlafaxine     REACTION: unspecified  . Zolpidem Tartrate     REACTION: Jittery, diarrhea  . Latex Rash    Patient Measurements: Height: 5\' 6"  (167.6 cm) Weight: 182 lb (82.555 kg) IBW/kg (Calculated) : 59.3 Heparin Dosing Weight: 76.7kg  Vital Signs: Temp: 98.2 F (36.8 C) (05/07 1709) Temp src: Oral (05/07 1709) BP: 127/57 mmHg (05/07 2018) Pulse Rate: 55 (05/07 2018)  Labs:  Recent Labs  10/08/13 1717 10/08/13 1900  HGB  --  12.0  HCT  --  36.1  PLT  --  170  CREATININE 0.68  --   TROPONINI <0.30  --     Estimated Creatinine Clearance: 71.9 ml/min (by C-G formula based  on Cr of 0.68).   Medical History: Past Medical History  Diagnosis Date  . Allergic rhinitis   . Diverticulitis of colon   . GERD (gastroesophageal reflux disease)   . Chronic depression   . Hypercholesterolemia     intolerance of statins and niaspan  . Apnea, sleep     mild, intolerant of cpap  . ACE-inhibitor cough   . Cataract   . Chronic headache   . Asthma, acute   . Hearing loss   . History of blood transfusion 2013  . Coronary artery disease   . Diabetes mellitus     type 2    Medications:  Infusions:  . [START ON 10/09/2013] sodium chloride    . heparin    . heparin      Assessment: [name] presented to the ED with CP. To start IV heparin for anticoagulation. CBC is WNL, troponin is negative so far. SHe is not on any anticoagulation PTA.  Goal of Therapy:  Heparin level 0.3-0.7 units/ml Monitor platelets by anticoagulation protocol: Yes   Plan:  1. Heparin bolus 4000 units IV x 1 2. Heparin gtt 1000 units/hr 3. Check an 8 hour heparin level 4. Daily heparin level and CBC  Rande Lawman Marce Charlesworth 10/08/2013,8:50 PM

## 2013-10-08 NOTE — Telephone Encounter (Signed)
Spoke w/ pt and her husband. Pt has difficulty hearing, so she and her husband pass the phone back and forth during conversation.  Husband states that pt is having continued chest pain and he would like to see about an emergency catheterization. Asked to speak w/ pt.  She sounds very anxious, reporting pain in her jaws, chest, shoulders and arms. States that she has used 2 sprays of nitro w/ no relief.  She is requesting an emergency catheterization and wants to know if Dr. Rockey Situ will set this up now.  Advised her that the scheduling dept has closed for the evening and I cannot check cath lab availability.  Spoke w/ Dr. Rockey Situ.  Pt states that she has taken 1 isosorbide since it was prescribed at her ov yesterday.  Advised her to increase this to BID. Inquired if pt is on blood thinners.  Pt's husband states that she is taking aspirin 325mg .   Advised pt to take antianxiety med, as this will help to relax her and help to relieve some of her symptoms.  She states "I want a cath.  I'm tired of hurting and I'm tired of asking.  Can Dr. Rockey Situ do this in [place]?" Advised her that she can proceed to [facility], but Dr. Rockey Situ will not perform the procedure there. She states that she wants to go to [facility] to see if she can have a cath performed as soon as possible. Spoke w/ Dr. Rockey Situ.  He is agreeable if pt wishes to proceed to [facility] for eval for cath.  Advised pt's husband of this.  He states that he will take pt to [facility] [place] [facility].  Asked him to call if we can be of further assistance.

## 2013-10-08 NOTE — H&P (Addendum)
Admission History and Physical     Patient ID: [name], MRN: [id number], DOB: 1945/08/26 68 y.o. Date of Encounter: 10/08/2013, 11:14 PM  Primary Physician: Arnette Norris, MD Primary Cardiologist:Timothy Rockey Situ, MD   Chief Complaint: Chest Pain  History of Present Illness: [name] is a 68 y.o. female with a history of hyperlipidemia, diabetes, COPD, chronic cough and coronary artery disease, status post two-vessel angioplasty with Promus drug-eluting stents in May 2009 stent to her RCA and mid circumflex, chronic CP and SOB.  She has had off and on chest pain for the past month, which became acutely worse this afternoon at 4:30p.  She describes her pain as a pressure running across her chest from right to left side with epigastric discomfort as well.  Similar to pain prior to stents in 2009.  Over the past month she has had this pain at rest, while sleeping, and also with exertion, but she does not think there is a causal relationship between what she is doing and the pain. Has had relief from NTG SL spray.  Last week she was placed in observation for chest pain at [facility], and had a pharmacologic myoview on 10/03/13 which was read as normal without ischemia or wall motion abnormalities.  Her pain became worse this evening after eating lunch (bland - rice) and not relieved by NTG.  Also with some associated nausea and SOB today.  No lightheadedness, edema, orthopnea, PND, diaphoresis, fevers, chills.  Active smoker.  In the ED was started on heparin gtt and nitroglycerin gtt given concern for unstable angina.  Troponin neg x2     Past Medical History  Diagnosis Date  . Allergic rhinitis   . Diverticulitis of colon   . GERD (gastroesophageal reflux disease)   . Chronic depression   . Hypercholesterolemia     intolerance of statins and niaspan  . Apnea, sleep     mild, intolerant of cpap  . ACE-inhibitor cough   . Cataract   . Chronic headache   . Asthma, acute   . Hearing  loss   . History of blood transfusion 2013  . Coronary artery disease   . Diabetes mellitus     type 2     Past Surgical History  Procedure Laterality Date  . Cholecystectomy  2001  . Colectomy      lap sigmoid  . Tonsillectomy    . Bartholin gland cyst excision    . Tubal ligation    . Bladder suspension    . Breast cyst excision  1988    bilateral nonmalignant tumors, x3  . Vaginal delivery      3  . Thyroidectomy    . Abdominal hysterectomy w/ partial vaginactomy    . Appendectomy  1964  . Colonoscopy  2014    polyps found, 2 clamped off.  . Cardiac catheterization    . Coronary angioplasty  10/29/2007    Prox RCA & Mid Cx.      Current Facility-Administered Medications  Medication Dose Route Frequency Provider Last Rate Last Dose  . [START ON 10/09/2013] 0.9 %  sodium chloride infusion   Intravenous Continuous Mervin Kung, MD 75 mL/hr at 10/08/13 2115 75 mL/hr at 10/08/13 2115  . [START ON 10/09/2013] aspirin EC tablet 81 mg  81 mg Oral Daily Stephani Police, MD      . Derrill Memo ON 10/09/2013] FLUoxetine (PROZAC) capsule 40 mg  40 mg Oral Daily Stephani Police, MD      .  heparin ADULT infusion 100 units/mL (25000 units/250 mL)  1,000 Units/hr Intravenous Continuous Rande Lawman Rumbarger, RPH 10 mL/hr at 10/08/13 2102 1,000 Units/hr at 10/08/13 2102  . [START ON 10/09/2013] isosorbide mononitrate (IMDUR) 24 hr tablet 30 mg  30 mg Oral Daily Stephani Police, MD      . Derrill Memo ON 10/09/2013] levothyroxine (SYNTHROID, LEVOTHROID) tablet 150 mcg  150 mcg Oral Daily Stephani Police, MD      . LORazepam (ATIVAN) tablet 0.5 mg  0.5 mg Oral Q6H PRN Stephani Police, MD      . nitroGLYCERIN (NITROLINGUAL) 0.4 MG/SPRAY spray 1 spray  1 spray Sublingual PRN Stephani Police, MD      . Derrill Memo ON 10/09/2013] pantoprazole (PROTONIX) EC tablet 40 mg  40 mg Oral Daily Stephani Police, MD      . traMADol Veatrice Bourbon) tablet 50 mg  50 mg Oral Q6H PRN Stephani Police, MD      . traZODone (DESYREL) tablet 200 mg  200 mg Oral QHS  Stephani Police, MD       Current Outpatient Prescriptions  Medication Sig Dispense Refill  . albuterol (PROVENTIL HFA;VENTOLIN HFA) 108 (90 BASE) MCG/ACT inhaler Inhale 2 puffs into the lungs every 6 (six) hours as needed for wheezing or shortness of breath. As needed for wheezing  1 Inhaler  6  . aspirin EC 325 MG tablet Take 1 tablet (325 mg total) by mouth daily.  30 tablet  0  . Calcium-Vitamin D (CALTRATE 600 PLUS-VIT D PO) Take 2 tablets by mouth 2 (two) times daily.       Marland Kitchen FLUoxetine (PROZAC) 40 MG capsule Take 40 mg by mouth 2 (two) times daily.      . insulin lispro protamine-lispro (HUMALOG 75/25 MIX) (75-25) 100 UNIT/ML SUSP injection Inject 50 Units into the skin 2 (two) times daily with a meal.      . isosorbide mononitrate (IMDUR) 30 MG 24 hr tablet Take 1 tablet (30 mg total) by mouth daily.  30 tablet  6  . lansoprazole (PREVACID) 30 MG capsule Take one by mouth before meal twice daily  180 capsule  3  . levothyroxine (SYNTHROID, LEVOTHROID) 150 MCG tablet Take 150 mcg by mouth daily.        Marland Kitchen LORazepam (ATIVAN) 0.5 MG tablet Take 0.5 mg by mouth 2 (two) times daily.       . nitroGLYCERIN (NITROLINGUAL) 0.4 MG/SPRAY spray Place 1 spray under the tongue as needed. Chest pain  12 g  3  . traMADol (ULTRAM) 50 MG tablet Take 50 mg by mouth every 6 (six) hours as needed for moderate pain.      . traZODone (DESYREL) 100 MG tablet Take 200 mg by mouth at bedtime.          Allergies: Allergies  Allergen Reactions  . Benadryl [Diphenhydramine] Shortness Of Breath  . Voltaren [Diclofenac Sodium] Shortness Of Breath  . Budesonide-Formoterol Fumarate     Shakiness, tremors  . Amitriptyline Other (See Comments)    unknown  . Ativan [Lorazepam]      Causes double vision at highter than .5 mg dose  . Atorvastatin Other (See Comments)    unknown  . Bupropion Hcl Other (See Comments)    "cloud over me" "depressed like"  . Codeine Sulfate Other (See Comments)    Makes chest hurt  .  Demerol [Meperidine] Other (See Comments)    unknown  . Gabapentin Other (See Comments)    unknown  . Lisinopril  REACTION: cough  . Meperidine Hcl Other (See Comments)    unknown  . Metformin     REACTION: unspecified  . Mirtazapine Other (See Comments)    unknown  . Mometasone Furoate     REACTION: Nause and vomiting  . Morphine Sulfate     REACTION: Chest pain  . Olanzapine Other (See Comments)    Unknown   . Other     Beta Blockers, reaction unknown but listed  . Oxycodone-Acetaminophen Other (See Comments)    Chest pain  . Propoxyphene N-Acetaminophen Other (See Comments)    Chest pain  . Rosuvastatin     REACTION: Makes patient weak.  . Shellfish Allergy Diarrhea  . Venlafaxine     REACTION: unspecified  . Zolpidem Tartrate     REACTION: Jittery, diarrhea  . Latex Rash     Social History:  The patient  reports that she has been smoking Cigarettes.  She has a 15 pack-year smoking history. She has never used smokeless tobacco. She reports that she does not drink alcohol or use illicit drugs.   Family History:  The patient's family history includes Asthma in her father; Breast cancer in her mother; Cancer in her father; Heart disease in an other family member; Hypertension in her father; Stroke in her paternal grandfather.   ROS:  Please see the history of present illness.    All other systems reviewed and negative.   Vital Signs: Blood pressure 132/50, pulse 53, temperature 98.2 F (36.8 C), temperature source Oral, resp. rate 16, height 5\' 6"  (1.676 m), weight 82.555 kg (182 lb), SpO2 97.00%.  PHYSICAL EXAM: General:  Well nourished, well developed, in no acute distress HEENT: normal Lymph: no adenopathy Neck: no JVD Endocrine:  No thryomegaly Vascular: No carotid bruits; FA pulses 2+ bilaterally without bruits Cardiac:  normal S1, S2; RRR;  2/6 SEM at RUSB.  No pain with palpation. Lungs:  clear to auscultation bilaterally, no wheezing, rhonchi or  rales Abd: Epigastrium is moderately tender to palpation.   Ext: no edema Musculoskeletal:  No deformities, BUE and BLE strength normal and equal Skin: warm and dry Neuro:  CNs 2-12 intact, no focal abnormalities noted Psych:  Normal affect   EKG:  NSR, no ischemic changes  Labs:   Lab Results  Component Value Date   WBC 9.9 10/08/2013   HGB 12.0 10/08/2013   HCT 36.1 10/08/2013   MCV 85.5 10/08/2013   PLT 170 10/08/2013    Recent Labs Lab 10/08/13 1717  NA 138  K 4.1  CL 102  CO2 21  BUN 14  CREATININE 0.68  CALCIUM 8.3*  GLUCOSE 203*    Recent Labs  10/08/13 1717  TROPONINI <0.30   Lab Results  Component Value Date   CHOL 246* 12/24/2008   HDL 28.90* 12/24/2008   LDLCALC 115* 06/23/2008   TRIG 223.0* 12/24/2008   Lab Results  Component Value Date   DDIMER 1.59* 11/13/2012    Radiology/Studies:  Dg Chest 2 View  10/08/2013   CLINICAL DATA:  Chest pain. Shortness of breath. Diaphoresis. Nausea. Diabetes.  EXAM: CHEST  2 VIEW  COMPARISON:  02/12/2011  FINDINGS: Heart size remains within normal limits. Mild central peribronchial thickening is stable. No evidence of acute infiltrate or pulmonary edema. No evidence of pleural effusion. No mass or lymphadenopathy identified.  IMPRESSION: Stable exam.  No active disease.   Electronically Signed   By: Earle Gell M.D.   On: 10/08/2013 18:23   Echo 09/2012:  Normal LVEF, mild AS, moderate AR  10/03/13 Myoview: Normal LVEF, no ischemia    ASSESSMENT AND PLAN:   1. Chest Pain, ?Unstable Angina - Her symptoms are atypical, but similar to pain prior to previous PCI - Will continue heparin gtt, ntg gtt and place in observation overnight. - Trop neg x2, repeat in the morning - Given epigastric discomfort on exam will check LFTs/Lipase.  Of note she is s/p cholecystectomy and appendectomy.  Continue PPI. - NPO for possible cath tomorrow (was scheduled for next week at [facility]). - To readdress statin with her this admission (has  refused in past) - Smoking cessation  2. Insulin Dependent DM - Given NPO status, stop 75/25. Will give 40u lantus tonight with QAC coverage and SSI.  3. Mild AS, Moderate AR -  Appears stable, BNP slightly elevated today  4. Anxiety - Continue home prozac and prn benzodiazapines.  5. Hypothyroid - Continue home synthroid.   Signed,  Stephani Police, MD 10/08/2013, 11:14 PM

## 2013-10-08 NOTE — ED Notes (Signed)
Blood sugar 203

## 2013-10-09 ENCOUNTER — Encounter (HOSPITAL_COMMUNITY)
Admission: EM | Disposition: A | Discharge status: Home Health | Payer: Medicare Other | Source: Home / Self Care | Attending: Surgery

## 2013-10-09 ENCOUNTER — Encounter (HOSPITAL_COMMUNITY): Payer: Self-pay | Admitting: *Deleted

## 2013-10-09 DIAGNOSIS — I200 Unstable angina: Secondary | ICD-10-CM

## 2013-10-09 DIAGNOSIS — E785 Hyperlipidemia, unspecified: Secondary | ICD-10-CM

## 2013-10-09 DIAGNOSIS — I2510 Atherosclerotic heart disease of native coronary artery without angina pectoris: Secondary | ICD-10-CM

## 2013-10-09 HISTORY — PX: LEFT HEART CATHETERIZATION WITH CORONARY ANGIOGRAM: SHX5451

## 2013-10-09 LAB — GLUCOSE, CAPILLARY
Glucose-Capillary: 153 mg/dL — ABNORMAL HIGH (ref 70–99)
Glucose-Capillary: 87 mg/dL (ref 70–99)
Glucose-Capillary: 79 mg/dL (ref 70–99)
Glucose-Capillary: 181 mg/dL — ABNORMAL HIGH (ref 70–99)

## 2013-10-09 LAB — CBC
HCT: 35.3 % — ABNORMAL LOW (ref 36.0–46.0)
Hemoglobin: 11.5 g/dL — ABNORMAL LOW (ref 12.0–15.0)
MCH: 28.1 pg (ref 26.0–34.0)
MCHC: 32.6 g/dL (ref 30.0–36.0)
MCV: 86.3 fL (ref 78.0–100.0)
Platelets: 143 10*3/uL — ABNORMAL LOW (ref 150–400)
RBC: 4.09 MIL/uL (ref 3.87–5.11)
RDW: 14.2 % (ref 11.5–15.5)
WBC: 7.3 10*3/uL (ref 4.0–10.5)

## 2013-10-09 LAB — BASIC METABOLIC PANEL
BUN: 12 mg/dL (ref 6–23)
CO2: 24 mEq/L (ref 19–32)
CREATININE: 0.71 mg/dL (ref 0.50–1.10)
Calcium: 8.2 mg/dL — ABNORMAL LOW (ref 8.4–10.5)
Chloride: 108 mEq/L (ref 96–112)
GFR, EST NON AFRICAN AMERICAN: 86 mL/min — AB (ref >90)
Glucose, Bld: 79 mg/dL (ref 70–99)
Potassium: 4.2 mEq/L (ref 3.7–5.3)
Sodium: 143 mEq/L (ref 137–147)

## 2013-10-09 LAB — HEPATIC FUNCTION PANEL
ALK PHOS: 94 U/L (ref 39–117)
ALT: 8 U/L (ref 0–35)
AST: 13 U/L (ref 0–37)
Albumin: 3 g/dL — ABNORMAL LOW (ref 3.5–5.2)
Bilirubin, Direct: <0.2 mg/dL (ref 0.0–0.3)
Total Protein: 6.6 g/dL (ref 6.0–8.3)

## 2013-10-09 LAB — LIPASE, BLOOD: LIPASE: 57 U/L (ref 11–59)

## 2013-10-09 LAB — TROPONIN I: Troponin I: <0.3 ng/mL (ref <0.30)

## 2013-10-09 LAB — POCT ACTIVATED CLOTTING TIME: Activated Clotting Time: 171 seconds

## 2013-10-09 LAB — HEPARIN LEVEL (UNFRACTIONATED)
Heparin Unfractionated: <0.1 IU/mL — ABNORMAL LOW (ref 0.30–0.70)
Heparin Unfractionated: <0.1 IU/mL — ABNORMAL LOW (ref 0.30–0.70)

## 2013-10-09 LAB — PROTIME-INR
INR: 1.06 (ref 0.00–1.49)
Prothrombin Time: 13.6 seconds (ref 11.6–15.2)

## 2013-10-09 SURGERY — LEFT HEART CATHETERIZATION WITH CORONARY ANGIOGRAM
Anesthesia: LOCAL

## 2013-10-09 MED ORDER — SODIUM CHLORIDE 0.9 % IV SOLN: INTRAVENOUS | Status: AC

## 2013-10-09 MED ORDER — FENTANYL CITRATE 0.05 MG/ML IJ SOLN
INTRAMUSCULAR | Status: AC
  Filled 2013-10-09: qty 2

## 2013-10-09 MED ORDER — NITROGLYCERIN IN D5W 200-5 MCG/ML-% IV SOLN
5.0000 ug/min | INTRAVENOUS | Status: DC
  Filled 2013-10-09: qty 250

## 2013-10-09 MED ORDER — FENTANYL CITRATE 0.05 MG/ML IJ SOLN
50.0000 ug | Freq: Once | INTRAMUSCULAR | Status: AC
  Administered 2013-10-09: 50 ug via INTRAVENOUS

## 2013-10-09 MED ORDER — VERAPAMIL HCL 2.5 MG/ML IV SOLN
INTRAVENOUS | Status: AC
  Filled 2013-10-09: qty 2

## 2013-10-09 MED ORDER — MIDAZOLAM HCL 2 MG/2ML IJ SOLN
INTRAMUSCULAR | Status: AC
  Filled 2013-10-09: qty 2

## 2013-10-09 MED ORDER — LIDOCAINE HCL (PF) 1 % IJ SOLN
INTRAMUSCULAR | Status: AC
  Filled 2013-10-09: qty 30

## 2013-10-09 MED ORDER — NITROGLYCERIN 0.2 MG/ML ON CALL CATH LAB
INTRAVENOUS | Status: AC
  Filled 2013-10-09: qty 1

## 2013-10-09 MED ORDER — ASPIRIN 81 MG PO CHEW: 81.0000 mg | CHEWABLE_TABLET | ORAL | Status: DC

## 2013-10-09 MED ORDER — HEPARIN SODIUM (PORCINE) 1000 UNIT/ML IJ SOLN
INTRAMUSCULAR | Status: AC
  Filled 2013-10-09: qty 1

## 2013-10-09 MED ORDER — ACETAMINOPHEN 500 MG PO TABS
1000.0000 mg | ORAL_TABLET | Freq: Four times a day (QID) | ORAL | Status: DC | PRN
  Administered 2013-10-09 – 2013-10-12 (×8): 1000 mg via ORAL
  Filled 2013-10-09 (×8): qty 2

## 2013-10-09 MED ORDER — SODIUM CHLORIDE 0.9 % IV SOLN
INTRAVENOUS | Status: DC
  Administered 2013-10-09: 14:00:00 via INTRAVENOUS

## 2013-10-09 MED ORDER — SODIUM CHLORIDE 0.9 % IJ SOLN: 3.0000 mL | Freq: Two times a day (BID) | INTRAMUSCULAR | Status: DC

## 2013-10-09 MED ORDER — SODIUM CHLORIDE 0.9 % IJ SOLN: 3.0000 mL | INTRAMUSCULAR | Status: DC | PRN

## 2013-10-09 MED ORDER — HEPARIN BOLUS VIA INFUSION
2000.0000 [IU] | Freq: Once | INTRAVENOUS | Status: AC
  Administered 2013-10-09: 2000 [IU] via INTRAVENOUS
  Filled 2013-10-09: qty 2000

## 2013-10-09 MED ORDER — HEPARIN (PORCINE) IN NACL 2-0.9 UNIT/ML-% IJ SOLN
INTRAMUSCULAR | Status: AC
  Filled 2013-10-09: qty 1500

## 2013-10-09 MED ORDER — SODIUM CHLORIDE 0.9 % IV SOLN: 250.0000 mL | INTRAVENOUS | Status: DC | PRN

## 2013-10-09 NOTE — H&P (View-Only) (Signed)
       Patient Name: [name] Date of Encounter: 10/09/2013    SUBJECTIVE:History of CAD with RCA and Circ. Stents. Recent negative nuclear. Progression of symptoms that are responsive to SL NTG.   TELEMETRY:  NSR Filed Vitals:   10/09/13 0030 10/09/13 0105 10/09/13 0330 10/09/13 0611  BP:  144/53 128/37 160/70  Pulse: 90 57 48 78  Temp: 98.8 F (37.1 C) 98.7 F (37.1 C) 98.1 F (36.7 C) 97.9 F (36.6 C)  TempSrc: Oral Oral Oral Oral  Resp: 20 18 16 18   Height:  5\' 6"  (1.676 m)    Weight:  184 lb 9.6 oz (83.734 kg)    SpO2: 99% 98% 97% 98%    Intake/Output Summary (Last 24 hours) at 10/09/13 0933 Last data filed at 10/09/13 2094  Gross per 24 hour  Intake      0 ml  Output   1000 ml  Net  -1000 ml   LABS: Basic Metabolic Panel:  Recent Labs  10/08/13 1717 10/09/13 0548  NA 138 143  K 4.1 4.2  CL 102 108  CO2 21 24  GLUCOSE 203* 79  BUN 14 12  CREATININE 0.68 0.71  CALCIUM 8.3* 8.2*   CBC:  Recent Labs  10/08/13 1900 10/09/13 0548  WBC 9.9 7.3  HGB 12.0 11.5*  HCT 36.1 35.3*  MCV 85.5 86.3  PLT 170 143*   Cardiac Enzymes:  Recent Labs  10/08/13 1717 10/09/13 0548  TROPONINI <0.30 <0.30    Radiology/Studies:  CXR with NAD  Physical Exam: Blood pressure 160/70, pulse 78, temperature 97.9 F (36.6 C), temperature source Oral, resp. rate 18, height 5\' 6"  (1.676 m), weight 184 lb 9.6 oz (83.734 kg), SpO2 98.00%. Weight change:   Wt Readings from Last 3 Encounters:  10/09/13 184 lb 9.6 oz (83.734 kg)  10/09/13 184 lb 9.6 oz (83.734 kg)  10/07/13 183 lb 4 oz (83.122 kg)    Chest is clear  Cardiac reveals S4 gallop and 2/6 systolic Murmur  ASSESSMENT:  1. Unstable angina despite "recent normal myoview"  Plan:  1. NPO with coronary angio later today  Signed, Belva Crome III 10/09/2013, 9:33 AM

## 2013-10-09 NOTE — CV Procedure (Signed)
Cardiac Catheterization Operative Report  [name] [id number] 5/8/20155:17 PM Arnette Norris, MD  Procedure Performed:  1. Left Heart Catheterization 2. Selective Coronary Angiography 3. Left ventricular angiogram  Operator: [name], MD  Indication: 68 yo female with history of CAD s/p PCI/DES of Circumflex and RCA in 2009 who is admitted with unstable angina.                                      Procedure Details: The risks, benefits, complications, treatment options, and expected outcomes were discussed with the patient. The patient and/or family concurred with the proposed plan, giving informed consent. The patient was brought to the cath lab after IV hydration was begun and oral premedication was given. The patient was further sedated with Versed and Fentanyl. I gained access into the right radial artery and passed a wire into the aortic root but could not adequately torque catheters due to vasospasm. I elected to gain access into the right femoral artery. The right groin was prepped and draped in the usual manner. Using the modified Seldinger access technique, a 5 French sheath was placed in the right femoral artery. Standard diagnostic catheters were used to perform selective coronary angiography. The RCA has an anterior takeoff with ostial stenosis and I could not engage the vessel selectively but non-selective images showed severe ostial stenosis. A pigtail catheter was used to perform a left ventricular angiogram.  There were no immediate complications. The patient was taken to the recovery area in stable condition.   Hemodynamic Findings: Central aortic pressure: 192/75 Left ventricular pressure: 191/23/28  Angiographic Findings:  Left main: No obstructive disease.   Left Anterior Descending Artery: Large caliber vessel that courses to the apex. The proximal vessel has a 50% stenosis tapering into a 95% stenosis just at the large septal perforating branch.  The mid and distal vessel has mild stenosis. The apical vessel has a focal 80% stenosis just as the vessel wraps around the apex. There is a moderate caliber diagonal branch with mild proximal plaque.   Circumflex Artery: Large caliber vessel with proximal stented segment with diffuse 30% restenosis. There are 3 small caliber obtuse marginal branches with mild plaque.   Right Coronary Artery: Large dominant vessel with 90% ostial stenosis. Proximal stented segment is patent without restenosis. The mid vessel just beyond the stented segment has a 50% stenosis.   Left Ventricular Angiogram: LVEF=55%  Impression: 1. 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 Patent Circumflex and proximal RCA stents 3. Unstable angina 4. Preserved LV systolic function  Recommendations: She has chest pain c/w unstable angina. I was unable to engage a catheter in her RCA despite multiple attempts with different catheters. The ostial location of the severe stenosis combined with the anterior takeoff of the vessel will make future catheter engagement of the RCA difficult. PCI of the RCA would be very difficult and may not yield a favorable result. She also has severe proximal LAD stenosis. The best method for revascularization will likely be CABG given ostial RCA stenosis and proximal LAD stenosis. She seems to be a good candidate for CABG. Will monitor tonight and would call CT surgery tomorrow for consult. The rounding team will need to alert the CT surgery team of the consult on 10/10/13. She may be able to go home after the consult and come back in next  1-2 weeks for CABG.        Complications:  None. The patient tolerated the procedure well.

## 2013-10-09 NOTE — Progress Notes (Signed)
ANTICOAGULATION CONSULT NOTE - Follow up [place] for heparin Indication: chest pain/ACS  Allergies  Allergen Reactions  . Benadryl [Diphenhydramine] Shortness Of Breath  . Voltaren [Diclofenac Sodium] Shortness Of Breath  . Budesonide-Formoterol Fumarate     Shakiness, tremors  . Amitriptyline Other (See Comments)    unknown  . Ativan [Lorazepam]      Causes double vision at highter than .5 mg dose  . Atorvastatin Other (See Comments)    unknown  . Bupropion Hcl Other (See Comments)    "cloud over me" "depressed like"  . Codeine Sulfate Other (See Comments)    Makes chest hurt  . Demerol [Meperidine] Other (See Comments)    unknown  . Gabapentin Other (See Comments)    unknown  . Lisinopril     REACTION: cough  . Meperidine Hcl Other (See Comments)    unknown  . Metformin     REACTION: unspecified  . Mirtazapine Other (See Comments)    unknown  . Mometasone Furoate     REACTION: Nause and vomiting  . Morphine Sulfate     REACTION: Chest pain  . Olanzapine Other (See Comments)    Unknown   . Other     Beta Blockers, reaction unknown but listed  . Oxycodone-Acetaminophen Other (See Comments)    Chest pain  . Propoxyphene N-Acetaminophen Other (See Comments)    Chest pain  . Rosuvastatin     REACTION: Makes patient weak.  . Shellfish Allergy Diarrhea  . Venlafaxine     REACTION: unspecified  . Zolpidem Tartrate     REACTION: Jittery, diarrhea  . Latex Rash    Patient Measurements: Height: 5\' 6"  (167.6 cm) Weight: 184 lb 9.6 oz (83.734 kg) IBW/kg (Calculated) : 59.3 Heparin Dosing Weight: 76.7kg  Vital Signs: Temp: 97.9 F (36.6 C) (05/08 0611) Temp src: Oral (05/08 0611) BP: 160/70 mmHg (05/08 0611) Pulse Rate: 78 (05/08 0611)  Labs:  Recent Labs  10/08/13 1717 10/08/13 1900 10/09/13 0548 10/09/13 0836  HGB  --  12.0 11.5*  --   HCT  --  36.1 35.3*  --   PLT  --  170 143*  --   HEPARINUNFRC  --   --   --  <0.10*   CREATININE 0.68  --  0.71  --   TROPONINI <0.30  --  <0.30  --     Estimated Creatinine Clearance: 72.4 ml/min (by C-G formula based on Cr of 0.71).   Medical History: Past Medical History  Diagnosis Date  . Allergic rhinitis   . Diverticulitis of colon   . GERD (gastroesophageal reflux disease)   . Chronic depression   . Hypercholesterolemia     intolerance of statins and niaspan  . Apnea, sleep     mild, intolerant of cpap  . ACE-inhibitor cough   . Cataract   . Chronic headache   . Asthma, acute   . Hearing loss   . History of blood transfusion 2013  . Coronary artery disease   . Diabetes mellitus     type 2  . Shortness of breath   . Anxiety     Medications:  Infusions:  . sodium chloride 75 mL/hr at 10/09/13 0129  . heparin 1,000 Units/hr (10/09/13 0122)    Assessment: 7 yof presented to the ED with CP. Currently on IV heparin for anticoagulation. Hgb and platelets have trended down, troponin is negative so far. A 7-hr heparin level this AM was sub-therapeutic at <0.1. Noted  plan for coronary angio later today. No bleeding noted.   Goal of Therapy:  Heparin level 0.3-0.7 units/ml Monitor platelets by anticoagulation protocol: Yes   Plan:  1. Repeat heparin bolus 2000 units IV x 1 2. Increase heparin gtt to 1250 units/hr 3. Check a 6 hour heparin level 4. Daily heparin level and CBC  Albertina Parr, PharmD.  Clinical Pharmacist Pager [phone]

## 2013-10-09 NOTE — Interval H&P Note (Signed)
History and Physical Interval Note:  10/09/2013 3:58 PM  [name]  has presented today for cardiac cath with the diagnosis of cp  The various methods of treatment have been discussed with the patient and family. After consideration of risks, benefits and other options for treatment, the patient has consented to  Procedure(s): LEFT HEART CATHETERIZATION WITH CORONARY ANGIOGRAM (N/A) as a surgical intervention .  The patient's history has been reviewed, patient examined, no change in status, stable for surgery.  I have reviewed the patient's chart and labs.  Questions were answered to the patient's satisfaction.    Cath Lab Visit (complete for each Cath Lab visit)  Clinical Evaluation Leading to the Procedure:   ACS: no  Non-ACS:    Anginal Classification: CCS III  Anti-ischemic medical therapy: Minimal Therapy (1 class of medications)  Non-Invasive Test Results: No non-invasive testing performed  Prior CABG: No previous CABG        [name]

## 2013-10-09 NOTE — Progress Notes (Signed)
Patient c/o chest tightness and pain in center of chest with no n/v or diaphoresis. Pt sat up in bed stated it improved. On nitro gtt , vs and ecg done. Pain relieved after a few minutes. Will cont to monitor

## 2013-10-09 NOTE — Progress Notes (Signed)
UR completed 

## 2013-10-09 NOTE — Progress Notes (Signed)
Patient heart rate staying in the upper 40's. Called md Sivak regarding. bp 128/37. No nausea, diaphoresis or chest pain.

## 2013-10-09 NOTE — Progress Notes (Signed)
       Patient Name: [name] Date of Encounter: 10/09/2013    SUBJECTIVE:History of CAD with RCA and Circ. Stents. Recent negative nuclear. Progression of symptoms that are responsive to SL NTG.   TELEMETRY:  NSR Filed Vitals:   10/09/13 0030 10/09/13 0105 10/09/13 0330 10/09/13 0611  BP:  144/53 128/37 160/70  Pulse: 90 57 48 78  Temp: 98.8 F (37.1 C) 98.7 F (37.1 C) 98.1 F (36.7 C) 97.9 F (36.6 C)  TempSrc: Oral Oral Oral Oral  Resp: 20 18 16 18  Height:  5' 6" (1.676 m)    Weight:  184 lb 9.6 oz (83.734 kg)    SpO2: 99% 98% 97% 98%    Intake/Output Summary (Last 24 hours) at 10/09/13 0933 Last data filed at 10/09/13 0611  Gross per 24 hour  Intake      0 ml  Output   1000 ml  Net  -1000 ml   LABS: Basic Metabolic Panel:  Recent Labs  10/08/13 1717 10/09/13 0548  NA 138 143  K 4.1 4.2  CL 102 108  CO2 21 24  GLUCOSE 203* 79  BUN 14 12  CREATININE 0.68 0.71  CALCIUM 8.3* 8.2*   CBC:  Recent Labs  10/08/13 1900 10/09/13 0548  WBC 9.9 7.3  HGB 12.0 11.5*  HCT 36.1 35.3*  MCV 85.5 86.3  PLT 170 143*   Cardiac Enzymes:  Recent Labs  10/08/13 1717 10/09/13 0548  TROPONINI <0.30 <0.30    Radiology/Studies:  CXR with NAD  Physical Exam: Blood pressure 160/70, pulse 78, temperature 97.9 F (36.6 C), temperature source Oral, resp. rate 18, height 5' 6" (1.676 m), weight 184 lb 9.6 oz (83.734 kg), SpO2 98.00%. Weight change:   Wt Readings from Last 3 Encounters:  10/09/13 184 lb 9.6 oz (83.734 kg)  10/09/13 184 lb 9.6 oz (83.734 kg)  10/07/13 183 lb 4 oz (83.122 kg)    Chest is clear  Cardiac reveals S4 gallop and 2/6 systolic Murmur  ASSESSMENT:  1. Unstable angina despite "recent normal myoview"  Plan:  1. NPO with coronary angio later today  Signed, Henry W Smith III 10/09/2013, 9:33 AM 

## 2013-10-10 DIAGNOSIS — I2510 Atherosclerotic heart disease of native coronary artery without angina pectoris: Secondary | ICD-10-CM

## 2013-10-10 DIAGNOSIS — F341 Dysthymic disorder: Secondary | ICD-10-CM

## 2013-10-10 DIAGNOSIS — E119 Type 2 diabetes mellitus without complications: Secondary | ICD-10-CM

## 2013-10-10 DIAGNOSIS — R079 Chest pain, unspecified: Secondary | ICD-10-CM

## 2013-10-10 DIAGNOSIS — I10 Essential (primary) hypertension: Secondary | ICD-10-CM

## 2013-10-10 LAB — CBC
HEMATOCRIT: 34.8 % — AB (ref 36.0–46.0)
HEMOGLOBIN: 11.2 g/dL — AB (ref 12.0–15.0)
MCH: 28.1 pg (ref 26.0–34.0)
MCHC: 32.2 g/dL (ref 30.0–36.0)
MCV: 87.4 fL (ref 78.0–100.0)
Platelets: 145 10*3/uL — ABNORMAL LOW (ref 150–400)
RBC: 3.98 MIL/uL (ref 3.87–5.11)
RDW: 14.1 % (ref 11.5–15.5)
WBC: 7.5 10*3/uL (ref 4.0–10.5)

## 2013-10-10 LAB — BASIC METABOLIC PANEL
BUN: 12 mg/dL (ref 6–23)
CALCIUM: 7.9 mg/dL — AB (ref 8.4–10.5)
CO2: 22 mEq/L (ref 19–32)
Chloride: 103 mEq/L (ref 96–112)
Creatinine, Ser: 0.77 mg/dL (ref 0.50–1.10)
GFR calc Af Amer: >90 mL/min (ref >90)
GFR, EST NON AFRICAN AMERICAN: 84 mL/min — AB (ref >90)
Glucose, Bld: 121 mg/dL — ABNORMAL HIGH (ref 70–99)
POTASSIUM: 4.5 meq/L (ref 3.7–5.3)
Sodium: 138 mEq/L (ref 137–147)

## 2013-10-10 LAB — MRSA PCR SCREENING: MRSA by PCR: NEGATIVE

## 2013-10-10 LAB — GLUCOSE, CAPILLARY
GLUCOSE-CAPILLARY: 165 mg/dL — AB (ref 70–99)
Glucose-Capillary: 117 mg/dL — ABNORMAL HIGH (ref 70–99)
Glucose-Capillary: 87 mg/dL (ref 70–99)
Glucose-Capillary: 138 mg/dL — ABNORMAL HIGH (ref 70–99)
Glucose-Capillary: 115 mg/dL — ABNORMAL HIGH (ref 70–99)

## 2013-10-10 MED ORDER — CALCIUM CARBONATE-VITAMIN D 500-200 MG-UNIT PO TABS
2.0000 | ORAL_TABLET | Freq: Two times a day (BID) | ORAL | Status: DC
  Administered 2013-10-10 – 2013-10-11 (×4): 2 via ORAL
  Filled 2013-10-10 (×8): qty 2

## 2013-10-10 NOTE — Progress Notes (Signed)
The patient was seen and examined, and I agree with the assessment and plan as documented above, with modifications as noted below. Pt had two episodes of chest pain early this morning, one at 3:15 and another at 5:30. Presently requiring 20 mcg of nitroglycerin drip and is presently free of pain. She would like to stay in hospital and undergo CABG. Surgical consult pending. Recent echo showed EF 50-55%, with mild aortic stenosis and moderate aortic regurgitation. No additional changes in medical therapy.

## 2013-10-10 NOTE — Progress Notes (Signed)
    Subjective:  Chest pain last night X 2- better with increased IV NTG  Objective:  Vital Signs in the last 24 hours: Temp:  [97.7 F (36.5 C)-98.6 F (37 C)] 98.2 F (36.8 C) (05/09 0746) Pulse Rate:  [36-68] 59 (05/09 0746) Resp:  [17-18] 18 (05/09 0746) BP: (122-178)/(36-76) 125/76 mmHg (05/09 0746) SpO2:  [92 %-100 %] 95 % (05/09 0746) Weight:  [183 lb 3.2 oz (83.1 kg)] 183 lb 3.2 oz (83.1 kg) (05/09 0646)  Intake/Output from previous day:  Intake/Output Summary (Last 24 hours) at 10/10/13 0809 Last data filed at 10/10/13 0340  Gross per 24 hour  Intake    250 ml  Output   1150 ml  Net   -900 ml    Physical Exam: General appearance: alert, cooperative, no distress and mildly obese Lungs: clear to auscultation bilaterally Heart: regular rate and rhythm and 2/6 systolic murmur Aov Extremities: Rt groin with some ecchymosis no hematoma   Rate: 60  Rhythm: normal sinus rhythm  Lab Results:  Recent Labs  10/09/13 0548 10/10/13 0514  WBC 7.3 7.5  HGB 11.5* 11.2*  PLT 143* 145*    Recent Labs  10/09/13 0548 10/10/13 0514  NA 143 138  K 4.2 4.5  CL 108 103  CO2 24 22  GLUCOSE 79 121*  BUN 12 12  CREATININE 0.71 0.77    Recent Labs  10/08/13 1717 10/09/13 0548  TROPONINI <0.30 <0.30    Recent Labs  10/09/13 0836  INR 1.06    Imaging: Imaging results have been reviewed  Cardiac Studies:  Assessment/Plan:  History of CAD with RCA and Circ. Stents 5/09. [place] with recent negative nuclear study. Cath 10/09/13 showed patent stents but progression of disease. CVTS consult pending.    Principal Problem:   Unstable angina Active Problems:   CAD- RCA/ CFX DES 5/09- Cath 10/09/13- severe 3V CAD   DIABETES MELLITUS, TYPE II   HYPERCHOLESTEROLEMIA- statin intol   OBSTRUCTIVE SLEEP APNEA- C pap intol   HYPERTENSION, BENIGN   Depression with anxiety    PLAN: Tx to stepdown, I will call CVTS today. Keep on IV NTG- consider Heparin if she has more  chest pain.   Kerin Ransom PA-C [facility]per [phone] 10/10/2013, 8:09 AM

## 2013-10-10 NOTE — Progress Notes (Signed)
0330- patient used call bell to tell us she was having CP, went in patient's room she ws having 10 out 10 CP, applied 02 at 2L, titrated NTG back up to 20 mcg, T 97.7, HR 56, BP 168/44, 02 96% with 02 at 2 L, EKG performed, no changes on EKG noted.  Pt also states her Right groin hurts. 0335- Extra Strength Tylenol given for groin pain, pt states chest pain is better now down to 5, BP now 144/46 0340- Pt states now chest pain is almost gone, will leave pt on 02 as well as Ntg 20 mcg and will continue to monitor

## 2013-10-10 NOTE — Consult Note (Signed)
[address]       [place] [address]             [phone]        Reason for Consult: Severe multi-vessel coronary artery disease Referring Physician: Dr. [name]  [name] is an 68 y.o. female.  HPI:   The patient has a history of hyperlipidemia, diabetes, ongoing smoking and COPD, and known coronary artery disease s/p DES to RCA and mid LCX in May 2009. She reports episodic chest pain for the past month that has been worsening and occuring at rest and with exertion, and awakening her from sleep. She has been using SL NTG spray with relief. Last week she had a pharmacologic myoview at [facility] that was normal. On the day prior to admission she developed severe pain after eating, not relieved with NTG. Troponin was negative x 2. Cath yesterday afternoon showed severe multi-vessel coronary disease. The LAD had 95% stenosis proximally. The LCX stent was patent with diffuse 30% restenosis. The RCA has a 90% ostial stenosis with a patent proximal stent and 50% mid vessel stenosis. She had recurrent chest pain twice this am which says was related to her NTG pump stopping. She also had some pain while talking to me today while on NTG.  She has a history of mild AS and moderate AI seen on echo on 09/30/12. The mean gradient was 12 mm Hg and peak 24. AVA was 2.28cm2  by VTI and 1.91 by Vmax. A recent echo at [facility] on 10/03/13 showed an increase in the mean gradient to 17 and an increase in the peak to 28 with an AVA of 1.5 cm2. There is still moderate AI with a slight decrease in the LVEF to 50-55% compared to last year when the LVEF was 55-60%. There was no gradient measured at cath.  Past Medical History  Diagnosis Date  . Allergic rhinitis   . Diverticulitis of colon   . GERD (gastroesophageal reflux disease)   . Chronic depression   . Hypercholesterolemia     intolerance of statins and niaspan  . Apnea, sleep     mild, intolerant of cpap  .  ACE-inhibitor cough   . Cataract   . Chronic headache   . Asthma, acute   . Hearing loss   . History of blood transfusion 2013  . Coronary artery disease   . Diabetes mellitus     type 2  . Shortness of breath   . Anxiety     Past Surgical History  Procedure Laterality Date  . Cholecystectomy  2001  . Colectomy      lap sigmoid  . Tonsillectomy    . Bartholin gland cyst excision    . Tubal ligation    . Bladder suspension    . Breast cyst excision  1988    bilateral nonmalignant tumors, x3  . Vaginal delivery      3  . Thyroidectomy    . Abdominal hysterectomy w/ partial vaginactomy    . Appendectomy  1964  . Colonoscopy  2014    polyps found, 2 clamped off.  . Cardiac catheterization    . Coronary angioplasty  10/29/2007    Prox RCA & Mid Cx.    Family History  Problem Relation Age of Onset  . Breast cancer Mother     breast  . Hypertension Father   . Cancer Father     mesothelioma  . Asthma Father   .  Stroke Paternal Grandfather   . Heart disease      Social History:  reports that she has been smoking Cigarettes.  She has a 15 pack-year smoking history. She has never used smokeless tobacco. She reports that she does not drink alcohol or use illicit drugs.  Allergies:  Allergies  Allergen Reactions  . Benadryl [Diphenhydramine] Shortness Of Breath  . Voltaren [Diclofenac Sodium] Shortness Of Breath  . Budesonide-Formoterol Fumarate     Shakiness, tremors  . Amitriptyline Other (See Comments)    unknown  . Ativan [Lorazepam]      Causes double vision at highter than .5 mg dose  . Atorvastatin Other (See Comments)    unknown  . Bupropion Hcl Other (See Comments)    "cloud over me" "depressed like"  . Codeine Sulfate Other (See Comments)    Makes chest hurt  . Demerol [Meperidine] Other (See Comments)    unknown  . Gabapentin Other (See Comments)    unknown  . Lisinopril     REACTION: cough  . Meperidine Hcl Other (See Comments)    unknown  .  Metformin     REACTION: unspecified  . Mirtazapine Other (See Comments)    unknown  . Mometasone Furoate     REACTION: Nause and vomiting  . Morphine Sulfate     REACTION: Chest pain  . Olanzapine Other (See Comments)    Unknown   . Other     Beta Blockers, reaction unknown but listed  . Oxycodone-Acetaminophen Other (See Comments)    Chest pain  . Propoxyphene N-Acetaminophen Other (See Comments)    Chest pain  . Rosuvastatin     REACTION: Makes patient weak.  . Shellfish Allergy Diarrhea  . Venlafaxine     REACTION: unspecified  . Zolpidem Tartrate     REACTION: Jittery, diarrhea  . Latex Rash    Medications:  I have reviewed the patient's current medications. Prior to Admission:  Prescriptions prior to admission  Medication Sig Dispense Refill  . albuterol (PROVENTIL HFA;VENTOLIN HFA) 108 (90 BASE) MCG/ACT inhaler Inhale 2 puffs into the lungs every 6 (six) hours as needed for wheezing or shortness of breath. As needed for wheezing  1 Inhaler  6  . aspirin EC 325 MG tablet Take 1 tablet (325 mg total) by mouth daily.  30 tablet  0  . Calcium-Vitamin D (CALTRATE 600 PLUS-VIT D PO) Take 2 tablets by mouth 2 (two) times daily.       Marland Kitchen FLUoxetine (PROZAC) 40 MG capsule Take 40 mg by mouth 2 (two) times daily.      . insulin lispro protamine-lispro (HUMALOG 75/25 MIX) (75-25) 100 UNIT/ML SUSP injection Inject 50 Units into the skin 2 (two) times daily with a meal.      . isosorbide mononitrate (IMDUR) 30 MG 24 hr tablet Take 1 tablet (30 mg total) by mouth daily.  30 tablet  6  . lansoprazole (PREVACID) 30 MG capsule Take one by mouth before meal twice daily  180 capsule  3  . levothyroxine (SYNTHROID, LEVOTHROID) 150 MCG tablet Take 150 mcg by mouth daily.        Marland Kitchen LORazepam (ATIVAN) 0.5 MG tablet Take 0.5 mg by mouth 2 (two) times daily.       . nitroGLYCERIN (NITROLINGUAL) 0.4 MG/SPRAY spray Place 1 spray under the tongue as needed. Chest pain  12 g  3  . traMADol (ULTRAM)  50 MG tablet Take 50 mg by mouth every 6 (six) hours as  needed for moderate pain.      . traZODone (DESYREL) 100 MG tablet Take 200 mg by mouth at bedtime.       Scheduled: . calcium-vitamin D  2 tablet Oral BID  . FLUoxetine  40 mg Oral Daily  . insulin aspart  0-15 Units Subcutaneous TID WC  . insulin aspart  0-5 Units Subcutaneous QHS  . insulin aspart  6 Units Subcutaneous TID WC  . insulin glargine  40 Units Subcutaneous QHS  . levothyroxine  150 mcg Oral QAC breakfast  . pantoprazole  40 mg Oral Daily  . traZODone  200 mg Oral QHS   Continuous: . nitroGLYCERIN 10 mcg/min (10/10/13 0300)   VOH:[id number], LORazepam, nitroGLYCERIN, traMADol Anti-infectives   None      Results for orders placed during the hospital encounter of 10/08/13 (from the past 48 hour(s))  BASIC METABOLIC PANEL     Status: Abnormal   Collection Time    10/08/13  5:17 PM      Result Value Ref Range   Sodium 138  137 - 147 mEq/L   Potassium 4.1  3.7 - 5.3 mEq/L   Chloride 102  96 - 112 mEq/L   CO2 21  19 - 32 mEq/L   Glucose, Bld 203 (*) 70 - 99 mg/dL   BUN 14  6 - 23 mg/dL   Creatinine, Ser 0.68  0.50 - 1.10 mg/dL   Calcium 8.3 (*) 8.4 - 10.5 mg/dL   GFR calc non Af Amer 87 (*) >90 mL/min   GFR calc Af Amer >90  >90 mL/min   Comment: (NOTE)     The eGFR has been calculated using the CKD EPI equation.     This calculation has not been validated in all clinical situations.     eGFR's persistently <90 mL/min signify possible Chronic Kidney     Disease.  TROPONIN I     Status: None   Collection Time    10/08/13  5:17 PM      Result Value Ref Range   Troponin I <0.30  <0.30 ng/mL   Comment:            Due to the release kinetics of cTnI,     a negative result within the first hours     of the onset of symptoms does not rule out     myocardial infarction with certainty.     If myocardial infarction is still suspected,     repeat the test at appropriate intervals.  Randolm Idol, ED      Status: None   Collection Time    10/08/13  5:43 PM      Result Value Ref Range   Troponin i, poc 0.00  0.00 - 0.08 ng/mL   Comment 3            Comment: Due to the release kinetics of cTnI,     a negative result within the first hours     of the onset of symptoms does not rule out     myocardial infarction with certainty.     If myocardial infarction is still suspected,     repeat the test at appropriate intervals.  CBC     Status: None   Collection Time    10/08/13  7:00 PM      Result Value Ref Range   WBC 9.9  4.0 - 10.5 K/uL   RBC 4.22  3.87 - 5.11 MIL/uL   Hemoglobin 12.0  12.0 -  15.0 g/dL   HCT 36.1  36.0 - 46.0 %   MCV 85.5  78.0 - 100.0 fL   MCH 28.4  26.0 - 34.0 pg   MCHC 33.2  30.0 - 36.0 g/dL   RDW 14.1  11.5 - 15.5 %   Platelets 170  150 - 400 K/uL  PRO B NATRIURETIC PEPTIDE     Status: Abnormal   Collection Time    10/08/13  8:45 PM      Result Value Ref Range   Pro B Natriuretic peptide (BNP) 225.2 (*) 0 - 125 pg/mL  HEPATIC FUNCTION PANEL     Status: Abnormal   Collection Time    10/08/13  8:45 PM      Result Value Ref Range   Total Protein 6.6  6.0 - 8.3 g/dL   Albumin 3.0 (*) 3.5 - 5.2 g/dL   AST 13  0 - 37 U/L   ALT 8  0 - 35 U/L   Alkaline Phosphatase 94  39 - 117 U/L   Total Bilirubin <0.2 (*) 0.3 - 1.2 mg/dL   Bilirubin, Direct <0.2  0.0 - 0.3 mg/dL   Indirect Bilirubin NOT CALCULATED  0.3 - 0.9 mg/dL  LIPASE, BLOOD     Status: None   Collection Time    10/08/13  8:45 PM      Result Value Ref Range   Lipase 57  11 - 59 U/L  I-STAT TROPOININ, ED     Status: None   Collection Time    10/08/13  9:06 PM      Result Value Ref Range   Troponin i, poc 0.02  0.00 - 0.08 ng/mL   Comment 3            Comment: Due to the release kinetics of cTnI,     a negative result within the first hours     of the onset of symptoms does not rule out     myocardial infarction with certainty.     If myocardial infarction is still suspected,     repeat the test at  appropriate intervals.  GLUCOSE, CAPILLARY     Status: Abnormal   Collection Time    10/09/13 12:56 AM      Result Value Ref Range   Glucose-Capillary 153 (*) 70 - 99 mg/dL   Comment 1 Notify RN    CBC     Status: Abnormal   Collection Time    10/09/13  5:48 AM      Result Value Ref Range   WBC 7.3  4.0 - 10.5 K/uL   RBC 4.09  3.87 - 5.11 MIL/uL   Hemoglobin 11.5 (*) 12.0 - 15.0 g/dL   HCT 35.3 (*) 36.0 - 46.0 %   MCV 86.3  78.0 - 100.0 fL   MCH 28.1  26.0 - 34.0 pg   MCHC 32.6  30.0 - 36.0 g/dL   RDW 14.2  11.5 - 15.5 %   Platelets 143 (*) 150 - 400 K/uL  BASIC METABOLIC PANEL     Status: Abnormal   Collection Time    10/09/13  5:48 AM      Result Value Ref Range   Sodium 143  137 - 147 mEq/L   Potassium 4.2  3.7 - 5.3 mEq/L   Chloride 108  96 - 112 mEq/L   CO2 24  19 - 32 mEq/L   Glucose, Bld 79  70 - 99 mg/dL   BUN 12  6 -  23 mg/dL   Creatinine, Ser 0.71  0.50 - 1.10 mg/dL   Calcium 8.2 (*) 8.4 - 10.5 mg/dL   GFR calc non Af Amer 86 (*) >90 mL/min   GFR calc Af Amer >90  >90 mL/min   Comment: (NOTE)     The eGFR has been calculated using the CKD EPI equation.     This calculation has not been validated in all clinical situations.     eGFR's persistently <90 mL/min signify possible Chronic Kidney     Disease.  TROPONIN I     Status: None   Collection Time    10/09/13  5:48 AM      Result Value Ref Range   Troponin I <0.30  <0.30 ng/mL   Comment:            Due to the release kinetics of cTnI,     a negative result within the first hours     of the onset of symptoms does not rule out     myocardial infarction with certainty.     If myocardial infarction is still suspected,     repeat the test at appropriate intervals.  GLUCOSE, CAPILLARY     Status: None   Collection Time    10/09/13  7:50 AM      Result Value Ref Range   Glucose-Capillary 87  70 - 99 mg/dL   Comment 1 Documented in Chart     Comment 2 Notify RN    HEPARIN LEVEL (UNFRACTIONATED)     Status:  Abnormal   Collection Time    10/09/13  8:36 AM      Result Value Ref Range   Heparin Unfractionated <0.10 (*) 0.30 - 0.70 IU/mL   Comment:            IF HEPARIN RESULTS ARE BELOW     EXPECTED VALUES, AND PATIENT     DOSAGE HAS BEEN CONFIRMED,     SUGGEST FOLLOW UP TESTING     OF ANTITHROMBIN III LEVELS.     SPECIMEN CHECKED FOR CLOTS     REPEATED TO VERIFY  PROTIME-INR     Status: None   Collection Time    10/09/13  8:36 AM      Result Value Ref Range   Prothrombin Time 13.6  11.6 - 15.2 seconds   INR 1.06  0.00 - 1.49  GLUCOSE, CAPILLARY     Status: None   Collection Time    10/09/13 11:51 AM      Result Value Ref Range   Glucose-Capillary 79  70 - 99 mg/dL   Comment 1 Documented in Chart     Comment 2 Notify RN    POCT ACTIVATED CLOTTING TIME     Status: None   Collection Time    10/09/13  5:13 PM      Result Value Ref Range   Activated Clotting Time 171    HEPARIN LEVEL (UNFRACTIONATED)     Status: Abnormal   Collection Time    10/09/13  9:00 PM      Result Value Ref Range   Heparin Unfractionated <0.10 (*) 0.30 - 0.70 IU/mL   Comment:            IF HEPARIN RESULTS ARE BELOW     EXPECTED VALUES, AND PATIENT     DOSAGE HAS BEEN CONFIRMED,     SUGGEST FOLLOW UP TESTING     OF ANTITHROMBIN III LEVELS.  GLUCOSE, CAPILLARY  Status: Abnormal   Collection Time    10/09/13  9:50 PM      Result Value Ref Range   Glucose-Capillary 181 (*) 70 - 99 mg/dL   Comment 1 Notify RN    GLUCOSE, CAPILLARY     Status: Abnormal   Collection Time    10/10/13  4:58 AM      Result Value Ref Range   Glucose-Capillary 117 (*) 70 - 99 mg/dL  CBC     Status: Abnormal   Collection Time    10/10/13  5:14 AM      Result Value Ref Range   WBC 7.5  4.0 - 10.5 K/uL   RBC 3.98  3.87 - 5.11 MIL/uL   Hemoglobin 11.2 (*) 12.0 - 15.0 g/dL   HCT 34.8 (*) 36.0 - 46.0 %   MCV 87.4  78.0 - 100.0 fL   MCH 28.1  26.0 - 34.0 pg   MCHC 32.2  30.0 - 36.0 g/dL   RDW 14.1  11.5 - 15.5 %    Platelets 145 (*) 150 - 400 K/uL  BASIC METABOLIC PANEL     Status: Abnormal   Collection Time    10/10/13  5:14 AM      Result Value Ref Range   Sodium 138  137 - 147 mEq/L   Potassium 4.5  3.7 - 5.3 mEq/L   Chloride 103  96 - 112 mEq/L   CO2 22  19 - 32 mEq/L   Glucose, Bld 121 (*) 70 - 99 mg/dL   BUN 12  6 - 23 mg/dL   Creatinine, Ser 0.77  0.50 - 1.10 mg/dL   Calcium 7.9 (*) 8.4 - 10.5 mg/dL   GFR calc non Af Amer 84 (*) >90 mL/min   GFR calc Af Amer >90  >90 mL/min   Comment: (NOTE)     The eGFR has been calculated using the CKD EPI equation.     This calculation has not been validated in all clinical situations.     eGFR's persistently <90 mL/min signify possible Chronic Kidney     Disease.  GLUCOSE, CAPILLARY     Status: Abnormal   Collection Time    10/10/13  8:29 AM      Result Value Ref Range   Glucose-Capillary 165 (*) 70 - 99 mg/dL  GLUCOSE, CAPILLARY     Status: None   Collection Time    10/10/13  1:13 PM      Result Value Ref Range   Glucose-Capillary 87  70 - 99 mg/dL  MRSA PCR SCREENING     Status: None   Collection Time    10/10/13  2:32 PM      Result Value Ref Range   MRSA by PCR NEGATIVE  NEGATIVE   Comment:            The GeneXpert MRSA Assay (FDA     approved for NASAL specimens     only), is one component of a     comprehensive MRSA colonization     surveillance program. It is not     intended to diagnose MRSA     infection nor to guide or     monitor treatment for     MRSA infections.    Dg Chest 2 View  10/08/2013   CLINICAL DATA:  Chest pain. Shortness of breath. Diaphoresis. Nausea. Diabetes.  EXAM: CHEST  2 VIEW  COMPARISON:  02/12/2011  FINDINGS: Heart size remains within normal limits. Mild central  peribronchial thickening is stable. No evidence of acute infiltrate or pulmonary edema. No evidence of pleural effusion. No mass or lymphadenopathy identified.  IMPRESSION: Stable exam.  No active disease.   Electronically Signed   By: Earle Gell M.D.   On: 10/08/2013 18:23    Review of Systems  Constitutional: Positive for malaise/fatigue and diaphoresis. Negative for fever, chills and weight loss.  HENT: Negative.   Eyes: Negative.   Respiratory: Positive for cough and shortness of breath. Negative for sputum production.   Cardiovascular: Positive for chest pain. Negative for palpitations, orthopnea, claudication, leg swelling and PND.  Gastrointestinal: Positive for nausea and abdominal pain. Negative for vomiting.  Genitourinary: Negative.   Musculoskeletal: Negative.   Skin: Negative.   Neurological: Negative for dizziness.  Endo/Heme/Allergies: Negative.   Psychiatric/Behavioral: Negative.    Blood pressure 163/49, pulse 55, temperature 98.5 F (36.9 C), temperature source Oral, resp. rate 18, height 5' 6" (1.676 m), weight 83.1 kg (183 lb 3.2 oz), SpO2 94.00%. Physical Exam  Constitutional: She is oriented to person, place, and time.  Truncal obesity  No distress  HENT:  Head: Normocephalic and atraumatic.  Mouth/Throat: Oropharynx is clear and moist.  Eyes: EOM are normal. Pupils are equal, round, and reactive to light.  Neck: Normal range of motion. Neck supple. No JVD present. No thyromegaly present.  Cardiovascular: Normal rate and regular rhythm.   Murmur heard. 2/6 systolic and diastolic murmur along RSB  Respiratory: Effort normal and breath sounds normal. No respiratory distress. She has no rales.  GI: Soft. Bowel sounds are normal. She exhibits no distension and no mass. There is no tenderness.  Musculoskeletal: Normal range of motion. She exhibits no edema.  Lymphadenopathy:    She has no cervical adenopathy.  Neurological: She is alert and oriented to person, place, and time. She has normal strength. No cranial nerve deficit or sensory deficit.  Skin: Skin is warm and dry.  Psychiatric: She has a normal mood and affect.     Cardiac Catheterization Operative Report  SARYIAH BENCOSME  [id number]    5/8/20155:17 PM  Arnette Norris, MD  Procedure Performed:  1. Left Heart Catheterization 2. Selective Coronary Angiography 3. Left ventricular angiogram Operator: [name], MD  Indication: 68 yo female with history of CAD s/p PCI/DES of Circumflex and RCA in 2009 who is admitted with unstable angina.  Procedure Details:  The risks, benefits, complications, treatment options, and expected outcomes were discussed with the patient. The patient and/or family concurred with the proposed plan, giving informed consent. The patient was brought to the cath lab after IV hydration was begun and oral premedication was given. The patient was further sedated with Versed and Fentanyl. I gained access into the right radial artery and passed a wire into the aortic root but could not adequately torque catheters due to vasospasm. I elected to gain access into the right femoral artery. The right groin was prepped and draped in the usual manner. Using the modified Seldinger access technique, a 5 French sheath was placed in the right femoral artery. Standard diagnostic catheters were used to perform selective coronary angiography. The RCA has an anterior takeoff with ostial stenosis and I could not engage the vessel selectively but non-selective images showed severe ostial stenosis. A pigtail catheter was used to perform a left ventricular angiogram.  There were no immediate complications. The patient was taken to the recovery area in stable condition.  Hemodynamic Findings:  Central aortic pressure: 192/75  Left ventricular  pressure: 191/23/28  Angiographic Findings:  Left main: No obstructive disease.  Left Anterior Descending Artery: Large caliber vessel that courses to the apex. The proximal vessel has a 50% stenosis tapering into a 95% stenosis just at the large septal perforating branch. The mid and distal vessel has mild stenosis. The apical vessel has a focal 80% stenosis just as the vessel wraps around  the apex. There is a moderate caliber diagonal branch with mild proximal plaque.  Circumflex Artery: Large caliber vessel with proximal stented segment with diffuse 30% restenosis. There are 3 small caliber obtuse marginal branches with mild plaque.  Right Coronary Artery: Large dominant vessel with 90% ostial stenosis. Proximal stented segment is patent without restenosis. The mid vessel just beyond the stented segment has a 50% stenosis.  Left Ventricular Angiogram: LVEF=55%  Impression:  1. Triple vessel CAD with severe stenosis proximal LAD, severe stenosis ostial RCA  2. Patent Circumflex and proximal RCA stents  3. Unstable angina  4. Preserved LV systolic function  Recommendations: She has chest pain c/w unstable angina. I was unable to engage a catheter in her RCA despite multiple attempts with different catheters. The ostial location of the severe stenosis combined with the anterior takeoff of the vessel will make future catheter engagement of the RCA difficult. PCI of the RCA would be very difficult and may not yield a favorable result. She also has severe proximal LAD stenosis. The best method for revascularization will likely be CABG given ostial RCA stenosis and proximal LAD stenosis. She seems to be a good candidate for CABG. Will monitor tonight and would call CT surgery tomorrow for consult. The rounding team will need to alert the CT surgery team of the consult on 10/10/13. She may be able to go home after the consult and come back in next 1-2 weeks for CABG.  Complications: None. The patient tolerated the procedure well.     Assessment/Plan:  She has severe multi-vessel CAD with high grade proximal LAD and ostial RCA stenoses and unstable anginal symptoms. I agree that CABG is the best treatment for this. I don't think the LCX requires grafting since there is insignificant stenosis. She also has mild aortic stenosis with a slightly higher gradient than last year and moderate AI that  was present last year on echo and was trivial in 02/2010 but not commented about on the echo in 07/2008. I don't have access to the echo done recently at [place] but I would look at her TEE in the OR and decide if the aortic valve should be replaced at the same time. Her AS is mild but the AI may be more of a problem as time passes. I would like to do surgery on Tuesday am if she remains stable on NTG. I would also start heparin. If she continues to have pain on heparin and NTG then I would plan to do Monday afternoon. I discussed the operative procedure with the patient and her husband including alternatives, benefits and risks; including but not limited to bleeding, blood transfusion, infection, stroke, myocardial infarction, graft failure, heart block requiring a permanent pacemaker, organ dysfunction, and death.  Freada Bergeron understands and agrees to proceed. I discussed possible aortic valve replacement depending on the TEE and I would use a tissue valve. They are in agreement with that. [name] 10/10/2013, 5:12 PM

## 2013-10-11 LAB — HEPARIN LEVEL (UNFRACTIONATED): HEPARIN UNFRACTIONATED: 0.14 [IU]/mL — AB (ref 0.30–0.70)

## 2013-10-11 LAB — CBC
HCT: 31 % — ABNORMAL LOW (ref 36.0–46.0)
Hemoglobin: 10.1 g/dL — ABNORMAL LOW (ref 12.0–15.0)
MCH: 28.5 pg (ref 26.0–34.0)
MCHC: 32.6 g/dL (ref 30.0–36.0)
MCV: 87.3 fL (ref 78.0–100.0)
PLATELETS: 142 10*3/uL — AB (ref 150–400)
RBC: 3.55 MIL/uL — ABNORMAL LOW (ref 3.87–5.11)
RDW: 14.1 % (ref 11.5–15.5)
WBC: 7.6 10*3/uL (ref 4.0–10.5)

## 2013-10-11 LAB — GLUCOSE, CAPILLARY
GLUCOSE-CAPILLARY: 78 mg/dL (ref 70–99)
GLUCOSE-CAPILLARY: 186 mg/dL — AB (ref 70–99)
GLUCOSE-CAPILLARY: 141 mg/dL — AB (ref 70–99)
Glucose-Capillary: 152 mg/dL — ABNORMAL HIGH (ref 70–99)
Glucose-Capillary: 91 mg/dL (ref 70–99)

## 2013-10-11 MED ORDER — HEPARIN (PORCINE) IN NACL 100-0.45 UNIT/ML-% IJ SOLN
1450.0000 [IU]/h | INTRAMUSCULAR | Status: DC
  Administered 2013-10-11: 1250 [IU]/h via INTRAVENOUS
  Filled 2013-10-11 (×2): qty 250

## 2013-10-11 MED ORDER — CHLORHEXIDINE GLUCONATE CLOTH 2 % EX PADS: 6.0000 | MEDICATED_PAD | Freq: Once | CUTANEOUS | Status: DC

## 2013-10-11 MED ORDER — NITROGLYCERIN IN D5W 200-5 MCG/ML-% IV SOLN
2.0000 ug/min | INTRAVENOUS | Status: DC
  Filled 2013-10-11 (×2): qty 250

## 2013-10-11 MED ORDER — POTASSIUM CHLORIDE 2 MEQ/ML IV SOLN
80.0000 meq | INTRAVENOUS | Status: DC
Start: 2013-10-12 — End: 2013-10-12
  Filled 2013-10-11: qty 40

## 2013-10-11 MED ORDER — CEFUROXIME SODIUM 750 MG IJ SOLR
750.0000 mg | INTRAMUSCULAR | Status: DC
  Filled 2013-10-11: qty 750

## 2013-10-11 MED ORDER — DOPAMINE-DEXTROSE 3.2-5 MG/ML-% IV SOLN
2.0000 ug/kg/min | INTRAVENOUS | Status: DC
  Filled 2013-10-11: qty 250

## 2013-10-11 MED ORDER — BISACODYL 5 MG PO TBEC
5.0000 mg | DELAYED_RELEASE_TABLET | Freq: Once | ORAL | Status: AC
  Administered 2013-10-11: 5 mg via ORAL

## 2013-10-11 MED ORDER — MAGNESIUM SULFATE 50 % IJ SOLN
40.0000 meq | INTRAMUSCULAR | Status: DC
  Filled 2013-10-11: qty 10

## 2013-10-11 MED ORDER — CHLORHEXIDINE GLUCONATE CLOTH 2 % EX PADS
6.0000 | MEDICATED_PAD | Freq: Once | CUTANEOUS | Status: AC
  Administered 2013-10-11: 6 via TOPICAL

## 2013-10-11 MED ORDER — SODIUM CHLORIDE 0.9 % IV SOLN
INTRAVENOUS | Status: DC
  Filled 2013-10-11: qty 30

## 2013-10-11 MED ORDER — PHENYLEPHRINE HCL 10 MG/ML IJ SOLN
30.0000 ug/min | INTRAMUSCULAR | Status: AC
  Administered 2013-10-12: 10 ug/min via INTRAVENOUS
  Filled 2013-10-11: qty 2

## 2013-10-11 MED ORDER — VANCOMYCIN HCL 10 G IV SOLR
1250.0000 mg | INTRAVENOUS | Status: AC
  Administered 2013-10-12: 1250 mg via INTRAVENOUS
  Filled 2013-10-11: qty 1250

## 2013-10-11 MED ORDER — SODIUM CHLORIDE 0.9 % IV SOLN
INTRAVENOUS | Status: DC
  Filled 2013-10-11: qty 40

## 2013-10-11 MED ORDER — DEXMEDETOMIDINE HCL IN NACL 400 MCG/100ML IV SOLN
0.1000 ug/kg/h | INTRAVENOUS | Status: DC
  Filled 2013-10-11: qty 100

## 2013-10-11 MED ORDER — HEPARIN BOLUS VIA INFUSION
3000.0000 [IU] | Freq: Once | INTRAVENOUS | Status: AC
  Administered 2013-10-11: 3000 [IU] via INTRAVENOUS
  Filled 2013-10-11: qty 3000

## 2013-10-11 MED ORDER — DEXTROSE 5 % IV SOLN
1.5000 g | INTRAVENOUS | Status: AC
  Administered 2013-10-12: 1.5 g via INTRAVENOUS
  Administered 2013-10-12: .75 g via INTRAVENOUS
  Filled 2013-10-11: qty 1.5

## 2013-10-11 MED ORDER — METOPROLOL TARTRATE 12.5 MG HALF TABLET
12.5000 mg | ORAL_TABLET | Freq: Two times a day (BID) | ORAL | Status: DC
  Administered 2013-10-11 – 2013-10-12 (×2): 12.5 mg via ORAL
  Filled 2013-10-11 (×4): qty 1

## 2013-10-11 MED ORDER — HEPARIN BOLUS VIA INFUSION
4000.0000 [IU] | Freq: Once | INTRAVENOUS | Status: AC
  Administered 2013-10-11: 4000 [IU] via INTRAVENOUS
  Filled 2013-10-11: qty 4000

## 2013-10-11 MED ORDER — METOPROLOL TARTRATE 12.5 MG HALF TABLET
12.5000 mg | ORAL_TABLET | Freq: Once | ORAL | Status: DC
  Filled 2013-10-11: qty 1

## 2013-10-11 MED ORDER — SODIUM CHLORIDE 0.9 % IV SOLN
INTRAVENOUS | Status: DC
  Filled 2013-10-11: qty 1

## 2013-10-11 MED ORDER — PLASMA-LYTE 148 IV SOLN
INTRAVENOUS | Status: DC
  Filled 2013-10-11: qty 2.5

## 2013-10-11 MED ORDER — EPINEPHRINE HCL 1 MG/ML IJ SOLN
0.5000 ug/min | INTRAMUSCULAR | Status: DC
  Filled 2013-10-11: qty 4

## 2013-10-11 MED ORDER — ASPIRIN 81 MG PO CHEW
81.0000 mg | CHEWABLE_TABLET | Freq: Once | ORAL | Status: AC
  Administered 2013-10-11: 81 mg via ORAL
  Filled 2013-10-11: qty 1

## 2013-10-11 NOTE — Progress Notes (Signed)
SUBJECTIVE: The patient is doing reasonably well today.  She continues to have intermittent chest pain but has had some relief with NTG gtt.  At this time, she denies shortness of breath  or any new concerns.  . calcium-vitamin D  2 tablet Oral BID  . FLUoxetine  40 mg Oral Daily  . insulin aspart  0-15 Units Subcutaneous TID WC  . insulin aspart  0-5 Units Subcutaneous QHS  . insulin aspart  6 Units Subcutaneous TID WC  . insulin glargine  40 Units Subcutaneous QHS  . levothyroxine  150 mcg Oral QAC breakfast  . metoprolol tartrate  12.5 mg Oral BID  . pantoprazole  40 mg Oral Daily  . traZODone  200 mg Oral QHS   . nitroGLYCERIN 20 mcg/min (10/11/13 0700)    OBJECTIVE: Physical Exam: Filed Vitals:   10/11/13 0400 10/11/13 0757 10/11/13 0800 10/11/13 1028  BP: 115/42 179/54 179/54 158/57  Pulse: 51 54 55 61  Temp:  97.5 F (36.4 C)    TempSrc:  Oral    Resp: 20 15 14 16   Height:      Weight:      SpO2: 96% 94% 94% 97%    Intake/Output Summary (Last 24 hours) at 10/11/13 1125 Last data filed at 10/11/13 0500  Gross per 24 hour  Intake  629.6 ml  Output   1725 ml  Net -1095.4 ml    Telemetry reveals sinus rhythm  GEN- The patient is well appearing, alert and oriented x 3 today.   Head- normocephalic, atraumatic Eyes-  Sclera clear, conjunctiva pink Ears- hearing intact Oropharynx- clear Neck- supple, no JVP Lymph- no cervical lymphadenopathy Lungs- Clear to ausculation bilaterally, normal work of breathing Heart- Regular rate and rhythm, 2/6 SEM LUSB mid to late peaking, 2/6 diastolic murmuc LUSB also appreciated GI- soft, NT, ND, + BS Extremities- no clubbing, cyanosis, or edema Skin- no rash or lesion Psych- euthymic mood, full affect Neuro- strength and sensation are intact  LABS: Basic Metabolic Panel:  Recent Labs  10/09/13 0548 10/10/13 0514  NA 143 138  K 4.2 4.5  CL 108 103  CO2 24 22  GLUCOSE 79 121*  BUN 12 12  CREATININE 0.71 0.77    CALCIUM 8.2* 7.9*   Liver Function Tests:  Recent Labs  10/08/13 2045  AST 13  ALT 8  ALKPHOS 94  BILITOT <0.2*  PROT 6.6  ALBUMIN 3.0*    Recent Labs  10/08/13 2045  LIPASE 57   CBC:  Recent Labs  10/10/13 0514 10/11/13 0235  WBC 7.5 7.6  HGB 11.2* 10.1*  HCT 34.8* 31.0*  MCV 87.4 87.3  PLT 145* 142*   Cardiac Enzymes:  Recent Labs  10/08/13 1717 10/09/13 0548  TROPONINI <0.30 <0.30   RADIOLOGY: Dg Chest 2 View  10/08/2013   CLINICAL DATA:  Chest pain. Shortness of breath. Diaphoresis. Nausea. Diabetes.  EXAM: CHEST  2 VIEW  COMPARISON:  02/12/2011  FINDINGS: Heart size remains within normal limits. Mild central peribronchial thickening is stable. No evidence of acute infiltrate or pulmonary edema. No evidence of pleural effusion. No mass or lymphadenopathy identified.  IMPRESSION: Stable exam.  No active disease.   Electronically Signed   By: Earle Gell M.D.   On: 10/08/2013 18:23    ASSESSMENT AND PLAN:  Principal Problem:   Unstable angina Active Problems:   DIABETES MELLITUS, TYPE II   HYPERCHOLESTEROLEMIA- statin intol   OBSTRUCTIVE SLEEP APNEA- C pap intol   HYPERTENSION,  BENIGN   Depression with anxiety   CAD- RCA/ CFX DES 5/09- Cath 10/09/13- severe 3V CAD  1. Unstable angina Add ASA and low dose metoprolol Add heparin drip Continue IV ntg Plans for CABG noted.  I have spoken with Dr Cyndia Bent this am.  He is following the patient very closely with [place].  2. AS/AI Echo is reviewed She will have operative TEE tomorrow as noted with plans to closely evaluate her aortic valve at that time.  3. HTN Continue nitro gtt Add metoprolol She carries an ACE inhibitor allergy.  Consider an ARB post operatively  4. HL She has not previously tolerated statins  Keep in TCU with plans for surgery tomorrow.   Thompson Grayer, MD 10/11/2013 11:25 AM

## 2013-10-11 NOTE — Progress Notes (Signed)
Pharmacy Consult - Heparin  Initial heparin level = 0.14 No bleeding noted  Plan: 1) Heparin 3000 units iv bolus x 1 2) Increase heparin drip to 1450 units / hr 3) Follow up AM  Thank you. Anette Guarneri, PharmD [phone]

## 2013-10-11 NOTE — Progress Notes (Signed)
ANTICOAGULATION CONSULT NOTE - Initial Consult  Pharmacy Consult for Heparin Indication: chest pain/ACS  Allergies  Allergen Reactions  . Benadryl [Diphenhydramine] Shortness Of Breath  . Voltaren [Diclofenac Sodium] Shortness Of Breath  . Budesonide-Formoterol Fumarate     Shakiness, tremors  . Amitriptyline Other (See Comments)    unknown  . Ativan [Lorazepam]      Causes double vision at highter than .5 mg dose  . Atorvastatin Other (See Comments)    unknown  . Bupropion Hcl Other (See Comments)    "cloud over me" "depressed like"  . Codeine Sulfate Other (See Comments)    Makes chest hurt  . Demerol [Meperidine] Other (See Comments)    unknown  . Gabapentin Other (See Comments)    unknown  . Lisinopril     REACTION: cough  . Meperidine Hcl Other (See Comments)    unknown  . Metformin     REACTION: unspecified  . Mirtazapine Other (See Comments)    unknown  . Mometasone Furoate     REACTION: Nause and vomiting  . Morphine Sulfate     REACTION: Chest pain  . Olanzapine Other (See Comments)    Unknown   . Other     Beta Blockers, reaction unknown but listed  . Oxycodone-Acetaminophen Other (See Comments)    Chest pain  . Propoxyphene N-Acetaminophen Other (See Comments)    Chest pain  . Rosuvastatin     REACTION: Makes patient weak.  . Shellfish Allergy Diarrhea  . Venlafaxine     REACTION: unspecified  . Zolpidem Tartrate     REACTION: Jittery, diarrhea  . Latex Rash    Patient Measurements: Height: 5\' 6"  (167.6 cm) Weight: 183 lb 3.2 oz (83.1 kg) IBW/kg (Calculated) : 59.3 Heparin Dosing Weight: 76.7  Vital Signs: Temp: 97.5 F (36.4 C) (05/10 0757) Temp src: Oral (05/10 0757) BP: 158/57 mmHg (05/10 1028) Pulse Rate: 61 (05/10 1028)  Labs:  Recent Labs  10/08/13 1717  10/09/13 0548 10/09/13 0836 10/09/13 2100 10/10/13 0514 10/11/13 0235  HGB  --   < > 11.5*  --   --  11.2* 10.1*  HCT  --   < > 35.3*  --   --  34.8* 31.0*  PLT  --    < > 143*  --   --  145* 142*  LABPROT  --   --   --  13.6  --   --   --   INR  --   --   --  1.06  --   --   --   HEPARINUNFRC  --   --   --  <0.10* <0.10*  --   --   CREATININE 0.68  --  0.71  --   --  0.77  --   TROPONINI <0.30  --  <0.30  --   --   --   --   < > = values in this interval not displayed.  Estimated Creatinine Clearance: 72.1 ml/min (by C-G formula based on Cr of 0.77).   Medical History: Past Medical History  Diagnosis Date  . Allergic rhinitis   . Diverticulitis of colon   . GERD (gastroesophageal reflux disease)   . Chronic depression   . Hypercholesterolemia     intolerance of statins and niaspan  . Apnea, sleep     mild, intolerant of cpap  . ACE-inhibitor cough   . Cataract   . Chronic headache   . Asthma, acute   .  Hearing loss   . History of blood transfusion 2013  . Coronary artery disease   . Diabetes mellitus     type 2  . Shortness of breath   . Anxiety     Medications:  Scheduled:  . aspirin  81 mg Oral Once  . calcium-vitamin D  2 tablet Oral BID  . FLUoxetine  40 mg Oral Daily  . insulin aspart  0-15 Units Subcutaneous TID WC  . insulin aspart  0-5 Units Subcutaneous QHS  . insulin aspart  6 Units Subcutaneous TID WC  . insulin glargine  40 Units Subcutaneous QHS  . levothyroxine  150 mcg Oral QAC breakfast  . metoprolol tartrate  12.5 mg Oral BID  . pantoprazole  40 mg Oral Daily  . traZODone  200 mg Oral QHS   Infusions:  . nitroGLYCERIN 20 mcg/min (10/11/13 0700)    Assessment: 68 yo F presented with CP initally started on heparin gtt then d/c'd post cath on 5/8. Now with continued CP. Pharmacy to dose heparin gtt. CBC stable, no signs of bleed noted. Patient was subtherapeutic with initial dose started previously, will increase dose slightly and bolus. Patient will likely go for her CABG tomorrow.   Goal of Therapy:  Heparin level 0.3-0.7 units/ml Monitor platelets by anticoagulation protocol: Yes   Plan:  1. Repeat  heparin bolus 4000 units IV x 1  2. Restart heparin gtt at 1250 units/hr  3. Check a 6 hour heparin level  4. Daily heparin level and CBC  Jaretssi Kraker M Vlasta Baskin 10/11/2013,11:43 AM

## 2013-10-11 NOTE — Progress Notes (Signed)
2 Days Post-Op Procedure(s) (LRB): LEFT HEART CATHETERIZATION WITH CORONARY ANGIOGRAM (N/A) Subjective:  Had some chest pain and jaw pain overnight and this am at rest. Resolved with increasing NTG. She is on 20 mcg. Has not been started on heparin.  Objective: Vital signs in last 24 hours: Temp:  [97.5 F (36.4 C)-98.5 F (36.9 C)] 97.5 F (36.4 C) (05/10 0757) Pulse Rate:  [51-57] 55 (05/10 0800) Cardiac Rhythm:  [-] Normal sinus rhythm (05/10 0752) Resp:  [14-21] 14 (05/10 0800) BP: (115-181)/(42-56) 179/54 mmHg (05/10 0800) SpO2:  [94 %-96 %] 94 % (05/10 0800)  Hemodynamic parameters for last 24 hours:    Intake/Output from previous day: 05/09 0701 - 05/10 0700 In: 869.6 [P.O.:840; I.V.:29.6] Out: 2225 [Urine:2225] Intake/Output this shift:    General appearance: alert and cooperative Heart: regular rate and rhythm, systolic and diastolic murmur Lungs: clear to auscultation bilaterally  Lab Results:  Recent Labs  10/10/13 0514 10/11/13 0235  WBC 7.5 7.6  HGB 11.2* 10.1*  HCT 34.8* 31.0*  PLT 145* 142*   BMET:  Recent Labs  10/09/13 0548 10/10/13 0514  NA 143 138  K 4.2 4.5  CL 108 103  CO2 24 22  GLUCOSE 79 121*  BUN 12 12  CREATININE 0.71 0.77  CALCIUM 8.2* 7.9*    PT/INR:  Recent Labs  10/09/13 0836  LABPROT 13.6  INR 1.06   ABG No results found for this basename: phart, pco2, po2, hco3, tco2, acidbasedef, o2sat   CBG (last 3)   Recent Labs  10/10/13 1805 10/10/13 2105 10/11/13 0802  GLUCAP 138* 115* 152*    Assessment/Plan: S/P Procedure(s) (LRB): LEFT HEART CATHETERIZATION WITH CORONARY ANGIOGRAM (N/A) Severe multi-vessel CAD with unstable angina Mild AS and moderate AI She has continued to have episodes of chest pain on NTG drip. I would start heparin. She ate breakfast this am. I will plan to do surgery tomorrow afternoon. Pt and husband are in agreement.   LOS: 3 days    [name] 10/11/2013

## 2013-10-12 ENCOUNTER — Encounter (HOSPITAL_COMMUNITY): Payer: Medicare Other | Admitting: Certified Registered Nurse Anesthetist

## 2013-10-12 ENCOUNTER — Inpatient Hospital Stay (HOSPITAL_COMMUNITY): Payer: Medicare Other

## 2013-10-12 ENCOUNTER — Telehealth: Payer: Self-pay

## 2013-10-12 ENCOUNTER — Inpatient Hospital Stay (HOSPITAL_COMMUNITY): Payer: Medicare Other | Admitting: Certified Registered Nurse Anesthetist

## 2013-10-12 ENCOUNTER — Encounter (HOSPITAL_COMMUNITY): Payer: Self-pay

## 2013-10-12 ENCOUNTER — Encounter (HOSPITAL_COMMUNITY)
Admission: EM | Disposition: A | Discharge status: Home Health | Payer: Medicare Other | Source: Home / Self Care | Attending: Surgery

## 2013-10-12 DIAGNOSIS — I359 Nonrheumatic aortic valve disorder, unspecified: Secondary | ICD-10-CM

## 2013-10-12 DIAGNOSIS — Z952 Presence of prosthetic heart valve: Secondary | ICD-10-CM

## 2013-10-12 DIAGNOSIS — Z01810 Encounter for preprocedural cardiovascular examination: Secondary | ICD-10-CM

## 2013-10-12 HISTORY — PX: CORONARY ARTERY BYPASS GRAFT: SHX141

## 2013-10-12 HISTORY — PX: AORTIC VALVE REPLACEMENT: SHX41

## 2013-10-12 LAB — POCT I-STAT 3, ART BLOOD GAS (G3+)
Acid-base deficit: 1 mmol/L (ref 0.0–2.0)
Bicarbonate: 23.3 mEq/L (ref 20.0–24.0)
O2 Saturation: 100 %
TCO2: 24 mmol/L (ref 0–100)
pCO2 arterial: 36.4 mmHg (ref 35.0–45.0)
pH, Arterial: 7.415 (ref 7.350–7.450)
pO2, Arterial: 305 mmHg — ABNORMAL HIGH (ref 80.0–100.0)

## 2013-10-12 LAB — CBC
HCT: 31.5 % — ABNORMAL LOW (ref 36.0–46.0)
HCT: 30.2 % — ABNORMAL LOW (ref 36.0–46.0)
HEMOGLOBIN: 10.4 g/dL — AB (ref 12.0–15.0)
Hemoglobin: 10 g/dL — ABNORMAL LOW (ref 12.0–15.0)
MCH: 28.8 pg (ref 26.0–34.0)
MCH: 28.7 pg (ref 26.0–34.0)
MCHC: 33 g/dL (ref 30.0–36.0)
MCHC: 33.1 g/dL (ref 30.0–36.0)
MCV: 87.3 fL (ref 78.0–100.0)
MCV: 86.8 fL (ref 78.0–100.0)
PLATELETS: 120 10*3/uL — AB (ref 150–400)
Platelets: 152 10*3/uL (ref 150–400)
RBC: 3.61 MIL/uL — AB (ref 3.87–5.11)
RBC: 3.48 MIL/uL — ABNORMAL LOW (ref 3.87–5.11)
RDW: 14.3 % (ref 11.5–15.5)
RDW: 14.5 % (ref 11.5–15.5)
WBC: 7.8 10*3/uL (ref 4.0–10.5)
WBC: 12.2 10*3/uL — AB (ref 4.0–10.5)

## 2013-10-12 LAB — POCT I-STAT 4, (NA,K, GLUC, HGB,HCT)
GLUCOSE: 99 mg/dL (ref 70–99)
GLUCOSE: 167 mg/dL — AB (ref 70–99)
Glucose, Bld: 116 mg/dL — ABNORMAL HIGH (ref 70–99)
Glucose, Bld: 90 mg/dL (ref 70–99)
Glucose, Bld: 119 mg/dL — ABNORMAL HIGH (ref 70–99)
HCT: 29 % — ABNORMAL LOW (ref 36.0–46.0)
HCT: 23 % — ABNORMAL LOW (ref 36.0–46.0)
HCT: 20 % — ABNORMAL LOW (ref 36.0–46.0)
HCT: 26 % — ABNORMAL LOW (ref 36.0–46.0)
HEMATOCRIT: 25 % — AB (ref 36.0–46.0)
Hemoglobin: 9.9 g/dL — ABNORMAL LOW (ref 12.0–15.0)
Hemoglobin: 7.8 g/dL — ABNORMAL LOW (ref 12.0–15.0)
Hemoglobin: 8.5 g/dL — ABNORMAL LOW (ref 12.0–15.0)
Hemoglobin: 6.8 g/dL — CL (ref 12.0–15.0)
Hemoglobin: 8.8 g/dL — ABNORMAL LOW (ref 12.0–15.0)
POTASSIUM: 5 meq/L (ref 3.7–5.3)
Potassium: 4.2 mEq/L (ref 3.7–5.3)
Potassium: 3.7 mEq/L (ref 3.7–5.3)
Potassium: 3.9 mEq/L (ref 3.7–5.3)
Potassium: 5.1 mEq/L (ref 3.7–5.3)
Sodium: 139 mEq/L (ref 137–147)
Sodium: 135 mEq/L — ABNORMAL LOW (ref 137–147)
Sodium: 139 mEq/L (ref 137–147)
Sodium: 136 mEq/L — ABNORMAL LOW (ref 137–147)
Sodium: 138 mEq/L (ref 137–147)

## 2013-10-12 LAB — GLUCOSE, CAPILLARY
GLUCOSE-CAPILLARY: 88 mg/dL (ref 70–99)
GLUCOSE-CAPILLARY: 104 mg/dL — AB (ref 70–99)
Glucose-Capillary: 92 mg/dL (ref 70–99)

## 2013-10-12 LAB — BASIC METABOLIC PANEL
BUN: 14 mg/dL (ref 6–23)
CHLORIDE: 105 meq/L (ref 96–112)
CO2: 24 meq/L (ref 19–32)
Calcium: 8.3 mg/dL — ABNORMAL LOW (ref 8.4–10.5)
Creatinine, Ser: 0.74 mg/dL (ref 0.50–1.10)
GFR calc Af Amer: >90 mL/min (ref >90)
GFR calc non Af Amer: 85 mL/min — ABNORMAL LOW (ref >90)
Glucose, Bld: 123 mg/dL — ABNORMAL HIGH (ref 70–99)
Potassium: 4.4 mEq/L (ref 3.7–5.3)
SODIUM: 141 meq/L (ref 137–147)

## 2013-10-12 LAB — PROTIME-INR
INR: 1.4 (ref 0.00–1.49)
Prothrombin Time: 16.8 seconds — ABNORMAL HIGH (ref 11.6–15.2)

## 2013-10-12 LAB — PREPARE RBC (CROSSMATCH)

## 2013-10-12 LAB — ABO/RH: ABO/RH(D): B POS

## 2013-10-12 LAB — HEPARIN LEVEL (UNFRACTIONATED): HEPARIN UNFRACTIONATED: 0.17 [IU]/mL — AB (ref 0.30–0.70)

## 2013-10-12 LAB — APTT: aPTT: 35 seconds (ref 24–37)

## 2013-10-12 LAB — HEMOGLOBIN AND HEMATOCRIT, BLOOD
HCT: 22.9 % — ABNORMAL LOW (ref 36.0–46.0)
Hemoglobin: 7.7 g/dL — ABNORMAL LOW (ref 12.0–15.0)

## 2013-10-12 LAB — PLATELET COUNT: Platelets: 121 10*3/uL — ABNORMAL LOW (ref 150–400)

## 2013-10-12 SURGERY — CORONARY ARTERY BYPASS GRAFTING (CABG)
Anesthesia: General | Site: Chest

## 2013-10-12 MED ORDER — ACETAMINOPHEN 160 MG/5ML PO SOLN: 650.0000 mg | Freq: Once | ORAL | Status: DC

## 2013-10-12 MED ORDER — NITROGLYCERIN IN D5W 200-5 MCG/ML-% IV SOLN: 0.0000 ug/min | INTRAVENOUS | Status: DC

## 2013-10-12 MED ORDER — MIDAZOLAM HCL 2 MG/2ML IJ SOLN
2.0000 mg | INTRAMUSCULAR | Status: DC | PRN
  Administered 2013-10-12 (×2): 2 mg via INTRAVENOUS
  Filled 2013-10-12: qty 2

## 2013-10-12 MED ORDER — MAGNESIUM SULFATE 4000MG/100ML IJ SOLN
4.0000 g | Freq: Once | INTRAMUSCULAR | Status: AC
  Administered 2013-10-12: 4 g via INTRAVENOUS

## 2013-10-12 MED ORDER — ALBUMIN HUMAN 5 % IV SOLN
INTRAVENOUS | Status: DC | PRN
  Administered 2013-10-12: 20:00:00 via INTRAVENOUS

## 2013-10-12 MED ORDER — BISACODYL 10 MG RE SUPP: 10.0000 mg | Freq: Every day | RECTAL | Status: DC

## 2013-10-12 MED ORDER — FENTANYL CITRATE 0.05 MG/ML IJ SOLN
INTRAMUSCULAR | Status: AC
  Filled 2013-10-12: qty 5

## 2013-10-12 MED ORDER — MIDAZOLAM HCL 2 MG/2ML IJ SOLN
INTRAMUSCULAR | Status: AC
  Filled 2013-10-12: qty 2

## 2013-10-12 MED ORDER — PROPOFOL 10 MG/ML IV BOLUS
INTRAVENOUS | Status: AC
  Filled 2013-10-12: qty 20

## 2013-10-12 MED ORDER — DOPAMINE-DEXTROSE 3.2-5 MG/ML-% IV SOLN
INTRAVENOUS | Status: DC | PRN
  Administered 2013-10-12: 3 ug/kg/min via INTRAVENOUS

## 2013-10-12 MED ORDER — ROCURONIUM BROMIDE 100 MG/10ML IV SOLN
INTRAVENOUS | Status: DC | PRN
  Administered 2013-10-12 (×2): 50 mg via INTRAVENOUS

## 2013-10-12 MED ORDER — PHENYLEPHRINE HCL 10 MG/ML IJ SOLN
0.0000 ug/min | INTRAVENOUS | Status: DC
  Filled 2013-10-12: qty 2

## 2013-10-12 MED ORDER — INSULIN REGULAR BOLUS VIA INFUSION
0.0000 [IU] | Freq: Three times a day (TID) | INTRAVENOUS | Status: DC
  Administered 2013-10-13: 2.8 [IU] via INTRAVENOUS
  Administered 2013-10-13: 3 [IU] via INTRAVENOUS
  Filled 2013-10-12: qty 10

## 2013-10-12 MED ORDER — LACTATED RINGERS IV SOLN
INTRAVENOUS | Status: DC | PRN
  Administered 2013-10-12 (×2): via INTRAVENOUS

## 2013-10-12 MED ORDER — MORPHINE SULFATE 2 MG/ML IJ SOLN: 1.0000 mg | INTRAMUSCULAR | Status: DC | PRN

## 2013-10-12 MED ORDER — LIDOCAINE HCL (CARDIAC) 20 MG/ML IV SOLN
INTRAVENOUS | Status: DC | PRN
  Administered 2013-10-12: 50 mg via INTRAVENOUS

## 2013-10-12 MED ORDER — VECURONIUM BROMIDE 10 MG IV SOLR
INTRAVENOUS | Status: DC | PRN
  Administered 2013-10-12: 3 mg via INTRAVENOUS
  Administered 2013-10-12: 4 mg via INTRAVENOUS
  Administered 2013-10-12: 3 mg via INTRAVENOUS
  Administered 2013-10-12: 5 mg via INTRAVENOUS
  Administered 2013-10-12: 2 mg via INTRAVENOUS

## 2013-10-12 MED ORDER — MAGNESIUM SULFATE 40 MG/ML IJ SOLN
INTRAMUSCULAR | Status: AC
Start: 2013-10-12 — End: 2013-10-13
  Administered 2013-10-12: 4 g via INTRAVENOUS
  Filled 2013-10-12: qty 100

## 2013-10-12 MED ORDER — THROMBIN 20000 UNITS EX SOLR
CUTANEOUS | Status: DC | PRN
  Administered 2013-10-12: 17:00:00 via TOPICAL

## 2013-10-12 MED ORDER — SODIUM CHLORIDE 0.9 % IV SOLN
INTRAVENOUS | Status: DC
  Filled 2013-10-12 (×2): qty 1

## 2013-10-12 MED ORDER — ACETAMINOPHEN 160 MG/5ML PO SOLN: 1000.0000 mg | Freq: Four times a day (QID) | ORAL | Status: DC

## 2013-10-12 MED ORDER — MIDAZOLAM HCL 10 MG/2ML IJ SOLN
INTRAMUSCULAR | Status: AC
  Filled 2013-10-12: qty 2

## 2013-10-12 MED ORDER — HEPARIN SODIUM (PORCINE) 1000 UNIT/ML IJ SOLN
INTRAMUSCULAR | Status: DC | PRN
  Administered 2013-10-12: 30 mL via INTRAVENOUS

## 2013-10-12 MED ORDER — VECURONIUM BROMIDE 10 MG IV SOLR
INTRAVENOUS | Status: AC
  Filled 2013-10-12: qty 10

## 2013-10-12 MED ORDER — SODIUM CHLORIDE 0.9 % IJ SOLN
INTRAMUSCULAR | Status: AC
  Filled 2013-10-12: qty 10

## 2013-10-12 MED ORDER — DEXTROSE 5 % IV SOLN
1.5000 g | Freq: Two times a day (BID) | INTRAVENOUS | Status: AC
  Administered 2013-10-13 – 2013-10-14 (×4): 1.5 g via INTRAVENOUS
  Filled 2013-10-12 (×4): qty 1.5

## 2013-10-12 MED ORDER — MIDAZOLAM HCL 5 MG/5ML IJ SOLN
INTRAMUSCULAR | Status: DC | PRN
  Administered 2013-10-12 (×3): 2 mg via INTRAVENOUS
  Administered 2013-10-12: 4 mg via INTRAVENOUS
  Administered 2013-10-12: 2 mg via INTRAVENOUS

## 2013-10-12 MED ORDER — 0.9 % SODIUM CHLORIDE (POUR BTL) OPTIME
TOPICAL | Status: DC | PRN
  Administered 2013-10-12: 1000 mL

## 2013-10-12 MED ORDER — LACTATED RINGERS IV SOLN
INTRAVENOUS | Status: DC | PRN
  Administered 2013-10-12: 14:00:00 via INTRAVENOUS

## 2013-10-12 MED ORDER — NITROGLYCERIN IN D5W 200-5 MCG/ML-% IV SOLN
INTRAVENOUS | Status: DC | PRN
  Administered 2013-10-12: 60 ug/min via INTRAVENOUS

## 2013-10-12 MED ORDER — ROCURONIUM BROMIDE 50 MG/5ML IV SOLN
INTRAVENOUS | Status: AC
  Filled 2013-10-12: qty 1

## 2013-10-12 MED ORDER — SODIUM CHLORIDE 0.9 % IV SOLN: INTRAVENOUS | Status: DC

## 2013-10-12 MED ORDER — HEPARIN (PORCINE) IN NACL 100-0.45 UNIT/ML-% IJ SOLN
1400.0000 [IU]/h | INTRAMUSCULAR | Status: DC
  Filled 2013-10-12 (×2): qty 250

## 2013-10-12 MED ORDER — ACETAMINOPHEN 500 MG PO TABS: 1000.0000 mg | ORAL_TABLET | Freq: Four times a day (QID) | ORAL | Status: DC

## 2013-10-12 MED ORDER — MORPHINE SULFATE 2 MG/ML IJ SOLN: 2.0000 mg | INTRAMUSCULAR | Status: DC | PRN

## 2013-10-12 MED ORDER — SODIUM CHLORIDE 0.9 % IJ SOLN
3.0000 mL | Freq: Two times a day (BID) | INTRAMUSCULAR | Status: DC
  Administered 2013-10-13: 3 mL via INTRAVENOUS
  Administered 2013-10-13: 9 mL via INTRAVENOUS
  Administered 2013-10-14 – 2013-10-15 (×3): 3 mL via INTRAVENOUS

## 2013-10-12 MED ORDER — SODIUM CHLORIDE 0.9 % IV SOLN
10.0000 g | INTRAVENOUS | Status: DC | PRN
  Administered 2013-10-12: 5 g via INTRAVENOUS
  Administered 2013-10-12: 19:00:00 via INTRAVENOUS

## 2013-10-12 MED ORDER — METOPROLOL TARTRATE 1 MG/ML IV SOLN
2.5000 mg | INTRAVENOUS | Status: DC | PRN
  Administered 2013-10-12: 5 mg via INTRAVENOUS

## 2013-10-12 MED ORDER — LACTATED RINGERS IV SOLN
INTRAVENOUS | Status: DC
  Administered 2013-10-12 – 2013-10-14 (×4): via INTRAVENOUS

## 2013-10-12 MED ORDER — LORAZEPAM 0.5 MG PO TABS
0.5000 mg | ORAL_TABLET | Freq: Once | ORAL | Status: AC
  Administered 2013-10-12: 0.5 mg via ORAL

## 2013-10-12 MED ORDER — LACTATED RINGERS IV SOLN: 500.0000 mL | Freq: Once | INTRAVENOUS | Status: AC | PRN

## 2013-10-12 MED ORDER — METOPROLOL TARTRATE 12.5 MG HALF TABLET
12.5000 mg | ORAL_TABLET | Freq: Two times a day (BID) | ORAL | Status: DC
  Administered 2013-10-13 – 2013-10-14 (×4): 12.5 mg via ORAL
  Filled 2013-10-12 (×7): qty 1

## 2013-10-12 MED ORDER — ASPIRIN 81 MG PO CHEW: 324.0000 mg | CHEWABLE_TABLET | Freq: Every day | ORAL | Status: DC

## 2013-10-12 MED ORDER — SODIUM CHLORIDE 0.9 % IV SOLN
250.0000 mL | INTRAVENOUS | Status: DC
  Administered 2013-10-13: 250 mL via INTRAVENOUS

## 2013-10-12 MED ORDER — METOPROLOL TARTRATE 25 MG/10 ML ORAL SUSPENSION
12.5000 mg | Freq: Two times a day (BID) | ORAL | Status: DC
  Administered 2013-10-12 – 2013-10-15 (×2): 12.5 mg
  Filled 2013-10-12 (×7): qty 5

## 2013-10-12 MED ORDER — NITROGLYCERIN 0.2 MG/ML ON CALL CATH LAB
INTRAVENOUS | Status: DC | PRN
  Administered 2013-10-12: 40 ug via INTRAVENOUS
  Administered 2013-10-12: 80 ug via INTRAVENOUS
  Administered 2013-10-12 (×2): 40 ug via INTRAVENOUS
  Administered 2013-10-12: 80 ug via INTRAVENOUS

## 2013-10-12 MED ORDER — ROCURONIUM BROMIDE 50 MG/5ML IV SOLN
INTRAVENOUS | Status: AC
  Filled 2013-10-12: qty 2

## 2013-10-12 MED ORDER — SODIUM CHLORIDE 0.45 % IV SOLN
INTRAVENOUS | Status: DC
  Administered 2013-10-12: 20:00:00 via INTRAVENOUS

## 2013-10-12 MED ORDER — VANCOMYCIN HCL IN DEXTROSE 1-5 GM/200ML-% IV SOLN
1000.0000 mg | Freq: Once | INTRAVENOUS | Status: AC
  Administered 2013-10-13: 1000 mg via INTRAVENOUS
  Filled 2013-10-12: qty 200

## 2013-10-12 MED ORDER — PROPOFOL 10 MG/ML IV BOLUS
INTRAVENOUS | Status: DC | PRN
  Administered 2013-10-12: 80 mg via INTRAVENOUS

## 2013-10-12 MED ORDER — SUCCINYLCHOLINE CHLORIDE 20 MG/ML IJ SOLN
INTRAMUSCULAR | Status: AC
  Filled 2013-10-12: qty 1

## 2013-10-12 MED ORDER — PANTOPRAZOLE SODIUM 40 MG PO TBEC
40.0000 mg | DELAYED_RELEASE_TABLET | Freq: Every day | ORAL | Status: DC
  Administered 2013-10-14 – 2013-10-15 (×2): 40 mg via ORAL
  Filled 2013-10-12 (×2): qty 1

## 2013-10-12 MED ORDER — SODIUM CHLORIDE 0.9 % IV SOLN
200.0000 ug | INTRAVENOUS | Status: DC | PRN
  Administered 2013-10-12: 0.2 ug/kg/h via INTRAVENOUS

## 2013-10-12 MED ORDER — SODIUM CHLORIDE 0.9 % IJ SOLN: 3.0000 mL | INTRAMUSCULAR | Status: DC | PRN

## 2013-10-12 MED ORDER — MIDAZOLAM HCL 2 MG/2ML IJ SOLN
INTRAMUSCULAR | Status: AC
  Administered 2013-10-12: 2 mg via INTRAVENOUS
  Filled 2013-10-12: qty 2

## 2013-10-12 MED ORDER — FAMOTIDINE IN NACL 20-0.9 MG/50ML-% IV SOLN
20.0000 mg | Freq: Two times a day (BID) | INTRAVENOUS | Status: DC
  Administered 2013-10-12: 20 mg via INTRAVENOUS
  Filled 2013-10-12: qty 50

## 2013-10-12 MED ORDER — ACETAMINOPHEN 160 MG/5ML PO SOLN
1000.0000 mg | Freq: Four times a day (QID) | ORAL | Status: DC
Start: 2013-10-12 — End: 2013-10-12

## 2013-10-12 MED ORDER — SODIUM CHLORIDE 0.9 % IV SOLN
INTRAVENOUS | Status: DC | PRN
  Administered 2013-10-12: 14:00:00 via INTRAVENOUS

## 2013-10-12 MED ORDER — ASPIRIN EC 325 MG PO TBEC
325.0000 mg | DELAYED_RELEASE_TABLET | Freq: Every day | ORAL | Status: DC
  Administered 2013-10-13 – 2013-10-15 (×3): 325 mg via ORAL
  Filled 2013-10-12 (×4): qty 1

## 2013-10-12 MED ORDER — ACETAMINOPHEN 650 MG RE SUPP: 650.0000 mg | Freq: Once | RECTAL | Status: DC

## 2013-10-12 MED ORDER — SODIUM CHLORIDE 0.9 % IV SOLN
INTRAVENOUS | Status: DC
  Filled 2013-10-12: qty 40

## 2013-10-12 MED ORDER — DOCUSATE SODIUM 100 MG PO CAPS
200.0000 mg | ORAL_CAPSULE | Freq: Every day | ORAL | Status: DC
  Administered 2013-10-13 – 2013-10-15 (×3): 200 mg via ORAL
  Filled 2013-10-12 (×3): qty 2

## 2013-10-12 MED ORDER — FLUOXETINE HCL 20 MG PO CAPS
40.0000 mg | ORAL_CAPSULE | Freq: Every day | ORAL | Status: DC
  Administered 2013-10-13 – 2013-10-20 (×8): 40 mg via ORAL
  Filled 2013-10-12 (×10): qty 2

## 2013-10-12 MED ORDER — ONDANSETRON HCL 4 MG/2ML IJ SOLN
4.0000 mg | Freq: Four times a day (QID) | INTRAMUSCULAR | Status: DC | PRN
  Administered 2013-10-13 – 2013-10-14 (×4): 4 mg via INTRAVENOUS
  Filled 2013-10-12 (×4): qty 2

## 2013-10-12 MED ORDER — ALBUMIN HUMAN 5 % IV SOLN: 250.0000 mL | INTRAVENOUS | Status: AC | PRN

## 2013-10-12 MED ORDER — SODIUM CHLORIDE 0.9 % IV SOLN
100.0000 [IU] | INTRAVENOUS | Status: DC | PRN
  Administered 2013-10-12: 1 [IU]/h via INTRAVENOUS

## 2013-10-12 MED ORDER — PROTAMINE SULFATE 10 MG/ML IV SOLN
INTRAVENOUS | Status: DC | PRN
  Administered 2013-10-12 (×3): 50 mg via INTRAVENOUS
  Administered 2013-10-12: 20 mg via INTRAVENOUS
  Administered 2013-10-12: 30 mg via INTRAVENOUS
  Administered 2013-10-12 (×2): 50 mg via INTRAVENOUS

## 2013-10-12 MED ORDER — LEVOTHYROXINE SODIUM 150 MCG PO TABS
150.0000 ug | ORAL_TABLET | Freq: Every day | ORAL | Status: DC
  Administered 2013-10-13 – 2013-10-20 (×8): 150 ug via ORAL
  Filled 2013-10-12 (×10): qty 1

## 2013-10-12 MED ORDER — FENTANYL CITRATE 0.05 MG/ML IJ SOLN
INTRAMUSCULAR | Status: AC
  Administered 2013-10-12: 100 ug via INTRAVENOUS
  Filled 2013-10-12: qty 2

## 2013-10-12 MED ORDER — FENTANYL CITRATE 0.05 MG/ML IJ SOLN
INTRAMUSCULAR | Status: DC | PRN
  Administered 2013-10-12: 100 ug via INTRAVENOUS
  Administered 2013-10-12: 150 ug via INTRAVENOUS
  Administered 2013-10-12: 250 ug via INTRAVENOUS
  Administered 2013-10-12: 100 ug via INTRAVENOUS
  Administered 2013-10-12: 250 ug via INTRAVENOUS
  Administered 2013-10-12: 100 ug via INTRAVENOUS
  Administered 2013-10-12: 50 ug via INTRAVENOUS
  Administered 2013-10-12 (×3): 250 ug via INTRAVENOUS

## 2013-10-12 MED ORDER — ACETAMINOPHEN 500 MG PO TABS
1000.0000 mg | ORAL_TABLET | Freq: Four times a day (QID) | ORAL | Status: DC
  Administered 2013-10-13: 1000 mg via ORAL
  Filled 2013-10-12 (×6): qty 2

## 2013-10-12 MED ORDER — HEMOSTATIC AGENTS (NO CHARGE) OPTIME
TOPICAL | Status: DC | PRN
  Administered 2013-10-12: 1 via TOPICAL

## 2013-10-12 MED ORDER — BISACODYL 5 MG PO TBEC
10.0000 mg | DELAYED_RELEASE_TABLET | Freq: Every day | ORAL | Status: DC
  Administered 2013-10-13 – 2013-10-15 (×3): 10 mg via ORAL
  Filled 2013-10-12 (×3): qty 2

## 2013-10-12 MED ORDER — THROMBIN 20000 UNITS EX SOLR
CUTANEOUS | Status: AC
  Filled 2013-10-12: qty 20000

## 2013-10-12 MED ORDER — LIDOCAINE HCL (CARDIAC) 20 MG/ML IV SOLN
INTRAVENOUS | Status: AC
  Filled 2013-10-12: qty 5

## 2013-10-12 MED ORDER — ASPIRIN 81 MG PO CHEW
81.0000 mg | CHEWABLE_TABLET | Freq: Every day | ORAL | Status: DC
  Administered 2013-10-12: 81 mg via ORAL
  Filled 2013-10-12: qty 1

## 2013-10-12 MED ORDER — OXYCODONE HCL 5 MG PO TABS: 5.0000 mg | ORAL_TABLET | ORAL | Status: DC | PRN

## 2013-10-12 MED ORDER — PHENYLEPHRINE HCL 10 MG/ML IJ SOLN
INTRAMUSCULAR | Status: DC | PRN
  Administered 2013-10-12: 40 ug via INTRAVENOUS

## 2013-10-12 MED ORDER — DEXTROSE 5 % IV SOLN
10.0000 mg | INTRAVENOUS | Status: DC | PRN
  Administered 2013-10-12: 25 ug/min via INTRAVENOUS

## 2013-10-12 MED ORDER — DEXMEDETOMIDINE HCL IN NACL 200 MCG/50ML IV SOLN: 0.1000 ug/kg/h | INTRAVENOUS | Status: DC

## 2013-10-12 MED ORDER — POTASSIUM CHLORIDE 10 MEQ/50ML IV SOLN: 10.0000 meq | INTRAVENOUS | Status: AC

## 2013-10-12 SURGICAL SUPPLY — 116 items
ATTRACTOMAT 16X20 MAGNETIC DRP (DRAPES) ×3 IMPLANT
BAG DECANTER FOR FLEXI CONT (MISCELLANEOUS) ×3 IMPLANT
BANDAGE ELASTIC 4 VELCRO ST LF (GAUZE/BANDAGES/DRESSINGS) ×3 IMPLANT
BANDAGE ELASTIC 6 VELCRO ST LF (GAUZE/BANDAGES/DRESSINGS) ×3 IMPLANT
BANDAGE GAUZE ELAST BULKY 4 IN (GAUZE/BANDAGES/DRESSINGS) ×3 IMPLANT
BASKET HEART  (ORDER IN 25'S) (MISCELLANEOUS) ×1
BASKET HEART (ORDER IN 25'S) (MISCELLANEOUS) ×1
BASKET HEART (ORDER IN 25S) (MISCELLANEOUS) ×1 IMPLANT
BLADE STERNUM SYSTEM 6 (BLADE) ×3 IMPLANT
BLADE SURG 15 STRL LF DISP TIS (BLADE) IMPLANT
BLADE SURG 15 STRL SS (BLADE) ×3
BNDG GAUZE ELAST 4 BULKY (GAUZE/BANDAGES/DRESSINGS) ×2 IMPLANT
CANISTER SUCTION 2500CC (MISCELLANEOUS) ×3 IMPLANT
CANNULA GUNDRY RCSP 15FR (MISCELLANEOUS) ×2 IMPLANT
CARDIAC SUCTION (MISCELLANEOUS) ×3 IMPLANT
CARDIOPLEGIA INFUSION (MISCELLANEOUS) ×2 IMPLANT
CATH HEART VENT LEFT (CATHETERS) IMPLANT
CATH ROBINSON RED A/P 18FR (CATHETERS) ×2 IMPLANT
CATH THORACIC 28FR (CATHETERS) ×3 IMPLANT
CATH THORACIC 36FR (CATHETERS) ×3 IMPLANT
CATH THORACIC 36FR RT ANG (CATHETERS) ×3 IMPLANT
CLIP TI MEDIUM 24 (CLIP) IMPLANT
CLIP TI WIDE RED SMALL 24 (CLIP) ×2 IMPLANT
COVER SURGICAL LIGHT HANDLE (MISCELLANEOUS) ×3 IMPLANT
CRADLE DONUT ADULT HEAD (MISCELLANEOUS) ×3 IMPLANT
DRAPE CARDIOVASCULAR INCISE (DRAPES) ×3
DRAPE SLUSH/WARMER DISC (DRAPES) ×3 IMPLANT
DRAPE SRG 135X102X78XABS (DRAPES) ×1 IMPLANT
DRSG COVADERM 4X14 (GAUZE/BANDAGES/DRESSINGS) ×3 IMPLANT
ELECT CAUTERY BLADE 6.4 (BLADE) ×3 IMPLANT
ELECT REM PT RETURN 9FT ADLT (ELECTROSURGICAL) ×6
ELECTRODE REM PT RTRN 9FT ADLT (ELECTROSURGICAL) ×2 IMPLANT
GLOVE BIO SURGEON STRL SZ 6 (GLOVE) IMPLANT
GLOVE BIO SURGEON STRL SZ 6.5 (GLOVE) IMPLANT
GLOVE BIO SURGEON STRL SZ7 (GLOVE) IMPLANT
GLOVE BIO SURGEON STRL SZ7.5 (GLOVE) ×2 IMPLANT
GLOVE BIO SURGEONS STRL SZ 6.5 (GLOVE)
GLOVE BIOGEL PI IND STRL 6 (GLOVE) IMPLANT
GLOVE BIOGEL PI IND STRL 6.5 (GLOVE) IMPLANT
GLOVE BIOGEL PI IND STRL 7.0 (GLOVE) IMPLANT
GLOVE BIOGEL PI INDICATOR 6 (GLOVE)
GLOVE BIOGEL PI INDICATOR 6.5 (GLOVE) ×6
GLOVE BIOGEL PI INDICATOR 7.0 (GLOVE)
GLOVE EUDERMIC 7 POWDERFREE (GLOVE) ×6 IMPLANT
GLOVE ORTHO TXT STRL SZ7.5 (GLOVE) IMPLANT
GLOVE SURG SS PI 6.5 STRL IVOR (GLOVE) ×2 IMPLANT
GOWN STRL REUS W/ TWL LRG LVL3 (GOWN DISPOSABLE) ×4 IMPLANT
GOWN STRL REUS W/ TWL XL LVL3 (GOWN DISPOSABLE) ×1 IMPLANT
GOWN STRL REUS W/TWL LRG LVL3 (GOWN DISPOSABLE) ×15
GOWN STRL REUS W/TWL XL LVL3 (GOWN DISPOSABLE) ×3
HEMOSTAT POWDER SURGIFOAM 1G (HEMOSTASIS) ×9 IMPLANT
HEMOSTAT SURGICEL 2X14 (HEMOSTASIS) ×3 IMPLANT
INSERT FOGARTY 61MM (MISCELLANEOUS) IMPLANT
INSERT FOGARTY XLG (MISCELLANEOUS) IMPLANT
KIT BASIN OR (CUSTOM PROCEDURE TRAY) ×3 IMPLANT
KIT CATH CPB BARTLE (MISCELLANEOUS) ×3 IMPLANT
KIT ROOM TURNOVER OR (KITS) ×3 IMPLANT
KIT SUCTION CATH 14FR (SUCTIONS) ×7 IMPLANT
KIT VASOVIEW W/TROCAR VH 2000 (KITS) ×3 IMPLANT
LINE VENT (MISCELLANEOUS) ×2 IMPLANT
NS IRRIG 1000ML POUR BTL (IV SOLUTION) ×15 IMPLANT
PACK OPEN HEART (CUSTOM PROCEDURE TRAY) ×3 IMPLANT
PAD ARMBOARD 7.5X6 YLW CONV (MISCELLANEOUS) ×6 IMPLANT
PAD ELECT DEFIB RADIOL ZOLL (MISCELLANEOUS) ×3 IMPLANT
PENCIL BUTTON HOLSTER BLD 10FT (ELECTRODE) ×3 IMPLANT
PUNCH AORTIC ROTATE 4.0MM (MISCELLANEOUS) ×2 IMPLANT
PUNCH AORTIC ROTATE 4.5MM 8IN (MISCELLANEOUS) ×3 IMPLANT
PUNCH AORTIC ROTATE 5MM 8IN (MISCELLANEOUS) IMPLANT
SET CARDIOPLEGIA MPS 5001102 (MISCELLANEOUS) ×2 IMPLANT
SPONGE GAUZE 4X4 12PLY (GAUZE/BANDAGES/DRESSINGS) ×6 IMPLANT
SPONGE GAUZE 4X4 12PLY STER LF (GAUZE/BANDAGES/DRESSINGS) ×4 IMPLANT
SPONGE INTESTINAL PEANUT (DISPOSABLE) IMPLANT
SPONGE LAP 18X18 X RAY DECT (DISPOSABLE) ×4 IMPLANT
SPONGE LAP 4X18 X RAY DECT (DISPOSABLE) ×3 IMPLANT
SUT BONE WAX W31G (SUTURE) ×3 IMPLANT
SUT ETHIBON 2 0 V 52N 30 (SUTURE) ×4 IMPLANT
SUT MNCRL AB 4-0 PS2 18 (SUTURE) IMPLANT
SUT PROLENE 3 0 SH DA (SUTURE) IMPLANT
SUT PROLENE 3 0 SH1 36 (SUTURE) ×3 IMPLANT
SUT PROLENE 4 0 RB 1 (SUTURE) ×9
SUT PROLENE 4 0 SH DA (SUTURE) IMPLANT
SUT PROLENE 4-0 RB1 .5 CRCL 36 (SUTURE) IMPLANT
SUT PROLENE 5 0 C 1 36 (SUTURE) IMPLANT
SUT PROLENE 6 0 C 1 30 (SUTURE) IMPLANT
SUT PROLENE 7 0 BV 1 (SUTURE) IMPLANT
SUT PROLENE 7 0 BV1 MDA (SUTURE) ×3 IMPLANT
SUT PROLENE 8 0 BV175 6 (SUTURE) IMPLANT
SUT SILK  1 MH (SUTURE)
SUT SILK 1 MH (SUTURE) IMPLANT
SUT SILK 2 0 SH CR/8 (SUTURE) ×2 IMPLANT
SUT STEEL STERNAL CCS#1 18IN (SUTURE) IMPLANT
SUT STEEL SZ 6 DBL 3X14 BALL (SUTURE) IMPLANT
SUT TEM PAC WIRE 2 0 SH (SUTURE) ×4 IMPLANT
SUT VIC AB 1 CT1 27 (SUTURE) ×3
SUT VIC AB 1 CT1 27XBRD ANBCTR (SUTURE) IMPLANT
SUT VIC AB 1 CTX 36 (SUTURE) ×6
SUT VIC AB 1 CTX36XBRD ANBCTR (SUTURE) ×2 IMPLANT
SUT VIC AB 2-0 CT1 27 (SUTURE) ×3
SUT VIC AB 2-0 CT1 TAPERPNT 27 (SUTURE) IMPLANT
SUT VIC AB 2-0 CTX 27 (SUTURE) IMPLANT
SUT VIC AB 3-0 SH 27 (SUTURE)
SUT VIC AB 3-0 SH 27X BRD (SUTURE) IMPLANT
SUT VIC AB 3-0 X1 27 (SUTURE) ×2 IMPLANT
SUT VICRYL 4-0 PS2 18IN ABS (SUTURE) IMPLANT
SUTURE E-PAK OPEN HEART (SUTURE) ×3 IMPLANT
SYSTEM SAHARA CHEST DRAIN ATS (WOUND CARE) ×3 IMPLANT
TAPE CLOTH SURG 4X10 WHT LF (GAUZE/BANDAGES/DRESSINGS) ×2 IMPLANT
TAPE PAPER 2X10 WHT MICROPORE (GAUZE/BANDAGES/DRESSINGS) ×2 IMPLANT
TOWEL OR 17X24 6PK STRL BLUE (TOWEL DISPOSABLE) ×3 IMPLANT
TOWEL OR 17X26 10 PK STRL BLUE (TOWEL DISPOSABLE) ×3 IMPLANT
TRAY FOLEY IC TEMP SENS 16FR (CATHETERS) ×3 IMPLANT
TUBING INSUFFLATION 10FT LAP (TUBING) ×3 IMPLANT
UNDERPAD 30X30 INCONTINENT (UNDERPADS AND DIAPERS) ×3 IMPLANT
VALVE MAGNA EASE 21MM (Prosthesis & Implant Heart) ×2 IMPLANT
VENT LEFT HEART 12002 (CATHETERS) ×3
WATER STERILE IRR 1000ML POUR (IV SOLUTION) ×6 IMPLANT

## 2013-10-12 NOTE — Progress Notes (Signed)
RN reports epistaxis x37min and still not resolved, RN concerned about heparin gtt.  More likely d/t bolus than gtt; will hold heparin x64min then resume at 1400 units/hr.  Plan for OHS today.  Wynona Neat, PharmD, BCPS 10/12/2013 12:38 AM

## 2013-10-12 NOTE — Anesthesia Preprocedure Evaluation (Addendum)
Anesthesia Evaluation  Patient identified by MRN, date of birth, ID band Patient awake    Reviewed: Allergy & Precautions, H&P , NPO status , Patient's Chart, lab work & pertinent test results, reviewed documented beta blocker date and time   History of Anesthesia Complications (+) PONV  Airway Mallampati: II TM Distance: >3 FB Neck ROM: Full    Dental  (+) Edentulous Upper, Edentulous Lower   Pulmonary shortness of breath, asthma , sleep apnea and Continuous Positive Airway Pressure Ventilation , Current Smoker,    Pulmonary exam normal + decreased breath sounds      Cardiovascular hypertension, Pt. on medications and Pt. on home beta blockers + angina + CAD Rhythm:Regular Rate:Normal     Neuro/Psych  Headaches, PSYCHIATRIC DISORDERS Anxiety Depression    GI/Hepatic GERD-  ,  Endo/Other  diabetes, Type 2, Insulin DependentHypothyroidism   Renal/GU      Musculoskeletal   Abdominal Normal abdominal exam  (+) + obese,   Peds  Hematology  (+) anemia ,   Anesthesia Other Findings   Reproductive/Obstetrics                          Anesthesia Physical Anesthesia Plan  ASA: IV  Anesthesia Plan: General   Post-op Pain Management:    Induction: Intravenous  Airway Management Planned: Oral ETT  Additional Equipment: Arterial line, CVP, PA Cath, TEE, 3D TEE and Ultrasound Guidance Line Placement  Intra-op Plan:   Post-operative Plan: Post-operative intubation/ventilation  Informed Consent: I have reviewed the patients History and Physical, chart, labs and discussed the procedure including the risks, benefits and alternatives for the proposed anesthesia with the patient or authorized representative who has indicated his/her understanding and acceptance.   Dental advisory given  Plan Discussed with: CRNA, Surgeon and Anesthesiologist  Anesthesia Plan Comments: (CAD with unstable angina Mild  aortic stenosis with aortic insufficiency Type 2 DM glucose 123 GERD Sleep apnea  Plan GA with intra-op TEE  Roberts Gaudy )       Anesthesia Quick Evaluation

## 2013-10-12 NOTE — Progress Notes (Signed)
Called to room, patient complaining of 10/10 pain.  Nitro gtt increased and sublingual nitro spray administered.  Patient reports a tight, squeezing pressure that is different from any pain she has felt before.  EKG obtained and appears unchanged.  Dr. Claiborne Billings updated.  New orders given.  Will implement and continue to monitor.

## 2013-10-12 NOTE — Anesthesia Postprocedure Evaluation (Signed)
  Anesthesia Post-op Note  Patient: [name]  Procedure(s) Performed: Procedure(s): CORONARY ARTERY BYPASS GRAFT TIMES TWO (N/A) AORTIC VALVE REPLACEMENT (AVR) (N/A)  Patient Location: SICU  Anesthesia Type:General  Level of Consciousness: sedated and Patient remains intubated per anesthesia plan  Airway and Oxygen Therapy: Patient remains intubated per anesthesia plan and Patient placed on Ventilator (see vital sign flow sheet for setting)  Post-op Pain: none  Post-op Assessment: Post-op Vital signs reviewed, Patient's Cardiovascular Status Stable and Respiratory Function Stable  Post-op Vital Signs: Reviewed and stable  Last Vitals:  Filed Vitals:   10/12/13 1212  BP: 135/58  Pulse:   Temp: 37.1 C  Resp: 15    Complications: No apparent anesthesia complications

## 2013-10-12 NOTE — Progress Notes (Signed)
VASCULAR LAB PRELIMINARY  PRELIMINARY  PRELIMINARY  PRELIMINARY  Pre-op Cardiac Surgery  Carotid Findings:  Bilateral:  1-39% ICA stenosis.  Vertebral artery flow is antegrade.      Upper Extremity Right Left  Brachial Pressures    Radial Waveforms Pending   Ulnar Waveforms    Palmar Arch (Allen's Test)     Findings:      Lower  Extremity Right Left  Dorsalis Pedis    Anterior Tibial    Posterior Tibial    Ankle/Brachial Indices      Findings:     Nani Ravens, RVT 10/12/2013, 12:03 PM

## 2013-10-12 NOTE — Progress Notes (Signed)
ANTICOAGULATION CONSULT NOTE - Initial Consult  Pharmacy Consult for Heparin Indication: chest pain/ACS   Patient Measurements: Height: 5\' 6"  (167.6 cm) Weight: 183 lb 3.2 oz (83.1 kg) IBW/kg (Calculated) : 59.3 Heparin Dosing Weight: 76.7  Vital Signs: Temp: 98.8 F (37.1 C) (05/11 1212) Temp src: Oral (05/11 1212) BP: 135/58 mmHg (05/11 1212) Pulse Rate: 55 (05/11 0800)  Labs:  Recent Labs  10/09/13 2100  10/10/13 0514 10/11/13 0235 10/11/13 1820 10/12/13 0350 10/12/13 0815  HGB  --   < > 11.2* 10.1*  --  10.4*  --   HCT  --   --  34.8* 31.0*  --  31.5*  --   PLT  --   --  145* 142*  --  152  --   HEPARINUNFRC <0.10*  --   --   --  0.14*  --  0.17*  CREATININE  --   --  0.77  --   --  0.74  --   < > = values in this interval not displayed.  Estimated Creatinine Clearance: 72.1 ml/min (by C-G formula based on Cr of 0.74).   Medical Hi Infusions:  . heparin 1,400 Units/hr (10/12/13 0800)  . nitroGLYCERIN 60 mcg/min (10/12/13 1054)    Assessment: 68 yo F presented with CP. Chest pain continues on and off. 3v CAD with plan for CABG this afternoon. Patient continues on heparin until called for surgery. Patient noted of having two episodes of epistaxis overnight, no further bleeding per nursing. Given pending surgery and recent bleeds will not titrate up heparin drip this morning and follow up any additional monitoring post - OHS. CBC has remained stable overnight.  Goal of Therapy:  Heparin level 0.3-0.7 units/ml Monitor platelets by anticoagulation protocol: Yes   Plan:  1. Continue heparin gtt at 1400 units/hr  2. Follow up after surgery  Georgina Peer 10/12/2013,12:27 PM

## 2013-10-12 NOTE — Progress Notes (Signed)
Patient ID: [name], female   DOB: Oct 29, 1945, 68 y.o.   MRN: [id number]    SUBJECTIVE: She has had chest pain on and off.  Currently no chest pain but has headache from NTG gtt.   Scheduled Meds: . aminocaproic acid (AMICAR) for OHS   Intravenous To OR  . calcium-vitamin D  2 tablet Oral BID  . cefUROXime (ZINACEF)  IV  1.5 g Intravenous To OR  . cefUROXime (ZINACEF)  IV  750 mg Intravenous To OR  . Chlorhexidine Gluconate Cloth  6 each Topical Once  . dexmedetomidine  0.1-0.7 mcg/kg/hr Intravenous To OR  . DOPamine  2-20 mcg/kg/min Intravenous To OR  . epinephrine  0.5-20 mcg/min Intravenous To OR  . FLUoxetine  40 mg Oral Daily  . heparin-papaverine-plasmalyte irrigation   Irrigation To OR  . heparin 30,000 units/NS 1000 mL solution for CELLSAVER   Other To OR  . insulin aspart  0-15 Units Subcutaneous TID WC  . insulin aspart  0-5 Units Subcutaneous QHS  . insulin aspart  6 Units Subcutaneous TID WC  . insulin glargine  40 Units Subcutaneous QHS  . insulin (NOVOLIN-R) infusion   Intravenous To OR  . levothyroxine  150 mcg Oral QAC breakfast  . magnesium sulfate  40 mEq Other To OR  . metoprolol tartrate  12.5 mg Oral Once  . metoprolol tartrate  12.5 mg Oral BID  . nitroGLYCERIN  2-200 mcg/min Intravenous To OR  . pantoprazole  40 mg Oral Daily  . phenylephrine (NEO-SYNEPHRINE) Adult infusion  30-200 mcg/min Intravenous To OR  . potassium chloride  80 mEq Other To OR  . traZODone  200 mg Oral QHS  . vancomycin  1,250 mg Intravenous To OR   Continuous Infusions: . heparin 1,400 Units/hr (10/12/13 0800)  . nitroGLYCERIN 20 mcg/min (10/11/13 2100)   PRN Meds:.acetaminophen, LORazepam, nitroGLYCERIN, traMADol    Filed Vitals:   10/11/13 2000 10/12/13 0000 10/12/13 0400 10/12/13 0800  BP: 151/53 128/45 138/51 152/52  Pulse: 58 54 63 55  Temp: 99.5 F (37.5 C) 99 F (37.2 C) 99.2 F (37.3 C) 99.1 F (37.3 C)  TempSrc: Oral Oral Oral Oral  Resp: 19 23 14 18     Height:      Weight:      SpO2: 98% 94% 93% 96%    Intake/Output Summary (Last 24 hours) at 10/12/13 0916 Last data filed at 10/12/13 0800  Gross per 24 hour  Intake 232.22 ml  Output   1200 ml  Net -967.78 ml    LABS: Basic Metabolic Panel:  Recent Labs  10/10/13 0514 10/12/13 0350  NA 138 141  K 4.5 4.4  CL 103 105  CO2 22 24  GLUCOSE 121* 123*  BUN 12 14  CREATININE 0.77 0.74  CALCIUM 7.9* 8.3*   Liver Function Tests: No results found for this basename: AST, ALT, ALKPHOS, BILITOT, PROT, ALBUMIN,  in the last 72 hours No results found for this basename: LIPASE, AMYLASE,  in the last 72 hours CBC:  Recent Labs  10/11/13 0235 10/12/13 0350  WBC 7.6 7.8  HGB 10.1* 10.4*  HCT 31.0* 31.5*  MCV 87.3 87.3  PLT 142* 152    RADIOLOGY: Dg Chest 2 View  10/08/2013   CLINICAL DATA:  Chest pain. Shortness of breath. Diaphoresis. Nausea. Diabetes.  EXAM: CHEST  2 VIEW  COMPARISON:  02/12/2011  FINDINGS: Heart size remains within normal limits. Mild central peribronchial thickening is stable. No evidence of acute infiltrate or  pulmonary edema. No evidence of pleural effusion. No mass or lymphadenopathy identified.  IMPRESSION: Stable exam.  No active disease.   Electronically Signed   By: Earle Gell M.D.   On: 10/08/2013 18:23    PHYSICAL EXAM General: mild distress, headache. Neck: No JVD, no thyromegaly or thyroid nodule.  Lungs: Clear to auscultation bilaterally with normal respiratory effort. CV: Nondisplaced PMI.  Heart regular S1/S2, no S3/S4, 2/6 SEM RUSB.  No peripheral edema.  No carotid bruit.  Normal pedal pulses.  Abdomen: Soft, nontender, no hepatosplenomegaly, no distention.  Neurologic: Alert and oriented x 3.  Psych: Normal affect. Extremities: No clubbing or cyanosis.   TELEMETRY: Reviewed telemetry pt in NSR  ASSESSMENT AND PLAN: 68 yo with unstable angina and 3 vessel disease.  1. CAD: Unstable angina with 3VD and ongoing episodes of chest pain.   EF 50-55% by echo.  Continue heparin gtt, ASA 81.  Has not tolerated statins.  CABG this afternoon.  2. Aortic valve disease: Moderate AI, mild AS.  Will reassess valve with TEE at time of CABG to determine need for treatment.   Larey Dresser 10/12/2013 9:20 AM

## 2013-10-12 NOTE — Brief Op Note (Signed)
      [id number]       [place] [address]             [phone]     10/08/2013 - 10/12/2013  6:25 PM  PATIENT:  [name]  68 y.o. female  PRE-OPERATIVE DIAGNOSIS:  Coronary Artery Disease, Aortic Valve insufficiency  POST-OPERATIVE DIAGNOSIS:  Coronary Artery Disease, Aortic Valve Insufficiency  PROCEDURE:  Procedure(s): CORONARY ARTERY BYPASS GRAFTING X2 LIMA-LAD; SVG-RCA AORTIC VALVE REPLACEMENT (AVR)# 21 MAGNAEASE EVH RIGHT THIGH  Aortic Valve  Procedure Performed:  Replacement: Yes.  Bioprosthetic Valve. Implant Model Number:[id number], Size:21, Unique Device Identifier:[id number]  Repair/Reconstruction: No.   Aortic Annular Enlargement: No.   Aortic Valve Etiology   Aortic Insufficiency:  Severe  Aortic Valve Disease:  Yes.  Aortic Stenosis:  AORTIC VALVE AREA 1.5 ; PEAK MEAN GRADIENT 28  Etiology (Choose at least one and up to  5 etiologies):  Degenerative - Calcified   SURGEON:  Surgeon(s): [name], MD  PHYSICIAN ASSISTANT: WAYNE GOLD PA-C  ANESTHESIA:   general  PATIENT CONDITION:  ICU - intubated and hemodynamically stable.  PRE-OPERATIVE WEIGHT: 44BE  COMPLICATIONS: NO KNOWN

## 2013-10-12 NOTE — Transfer of Care (Signed)
Immediate Anesthesia Transfer of Care Note  Patient: [name]  Procedure(s) Performed: Procedure(s): CORONARY ARTERY BYPASS GRAFT TIMES TWO (N/A) AORTIC VALVE REPLACEMENT (AVR) (N/A)  Patient Location: SICU  Anesthesia Type:General  Level of Consciousness: sedated and Patient remains intubated per anesthesia plan  Airway & Oxygen Therapy: Patient remains intubated per anesthesia plan and Patient placed on Ventilator (see vital sign flow sheet for setting)  Post-op Assessment: Report given to PACU RN and Post -op Vital signs reviewed and stable  Post vital signs: Reviewed and stable  Complications: No apparent anesthesia complications

## 2013-10-12 NOTE — Telephone Encounter (Signed)
Pt's husband called to cancel cath sched for 5/14 @ [facility] as pt is having this done at [facility]

## 2013-10-13 ENCOUNTER — Encounter (HOSPITAL_COMMUNITY): Payer: Self-pay | Admitting: Surgery

## 2013-10-13 ENCOUNTER — Inpatient Hospital Stay (HOSPITAL_COMMUNITY): Payer: Medicare Other

## 2013-10-13 LAB — GLUCOSE, CAPILLARY
GLUCOSE-CAPILLARY: 116 mg/dL — AB (ref 70–99)
GLUCOSE-CAPILLARY: 146 mg/dL — AB (ref 70–99)
GLUCOSE-CAPILLARY: 156 mg/dL — AB (ref 70–99)
Glucose-Capillary: 108 mg/dL — ABNORMAL HIGH (ref 70–99)
Glucose-Capillary: 109 mg/dL — ABNORMAL HIGH (ref 70–99)
Glucose-Capillary: 118 mg/dL — ABNORMAL HIGH (ref 70–99)
Glucose-Capillary: 119 mg/dL — ABNORMAL HIGH (ref 70–99)
Glucose-Capillary: 120 mg/dL — ABNORMAL HIGH (ref 70–99)
Glucose-Capillary: 122 mg/dL — ABNORMAL HIGH (ref 70–99)
Glucose-Capillary: 129 mg/dL — ABNORMAL HIGH (ref 70–99)
Glucose-Capillary: 131 mg/dL — ABNORMAL HIGH (ref 70–99)
Glucose-Capillary: 134 mg/dL — ABNORMAL HIGH (ref 70–99)
Glucose-Capillary: 148 mg/dL — ABNORMAL HIGH (ref 70–99)
Glucose-Capillary: 150 mg/dL — ABNORMAL HIGH (ref 70–99)

## 2013-10-13 LAB — BASIC METABOLIC PANEL
BUN: 11 mg/dL (ref 6–23)
CALCIUM: 7.2 mg/dL — AB (ref 8.4–10.5)
CO2: 20 mEq/L (ref 19–32)
CREATININE: 0.73 mg/dL (ref 0.50–1.10)
Chloride: 106 mEq/L (ref 96–112)
GFR, EST NON AFRICAN AMERICAN: 85 mL/min — AB (ref >90)
GLUCOSE: 137 mg/dL — AB (ref 70–99)
POTASSIUM: 4.4 meq/L (ref 3.7–5.3)
Sodium: 137 mEq/L (ref 137–147)

## 2013-10-13 LAB — POCT I-STAT 3, ART BLOOD GAS (G3+)
Acid-base deficit: 3 mmol/L — ABNORMAL HIGH (ref 0.0–2.0)
Acid-base deficit: 4 mmol/L — ABNORMAL HIGH (ref 0.0–2.0)
Acid-base deficit: 3 mmol/L — ABNORMAL HIGH (ref 0.0–2.0)
BICARBONATE: 23.1 meq/L (ref 20.0–24.0)
Bicarbonate: 22.4 mEq/L (ref 20.0–24.0)
Bicarbonate: 23.9 mEq/L (ref 20.0–24.0)
O2 Saturation: 96 %
O2 Saturation: 99 %
O2 Saturation: 97 %
PCO2 ART: 44.5 mmHg (ref 35.0–45.0)
PCO2 ART: 49.8 mmHg — AB (ref 35.0–45.0)
PH ART: 7.34 — AB (ref 7.350–7.450)
PO2 ART: 91 mmHg (ref 80.0–100.0)
Patient temperature: 37.5
Patient temperature: 37.2
TCO2: 24 mmol/L (ref 0–100)
TCO2: 24 mmol/L (ref 0–100)
TCO2: 25 mmol/L (ref 0–100)
pCO2 arterial: 42.7 mmHg (ref 35.0–45.0)
pH, Arterial: 7.313 — ABNORMAL LOW (ref 7.350–7.450)
pH, Arterial: 7.291 — ABNORMAL LOW (ref 7.350–7.450)
pO2, Arterial: 138 mmHg — ABNORMAL HIGH (ref 80.0–100.0)
pO2, Arterial: 108 mmHg — ABNORMAL HIGH (ref 80.0–100.0)

## 2013-10-13 LAB — CBC
HCT: 31 % — ABNORMAL LOW (ref 36.0–46.0)
HEMATOCRIT: 31.6 % — AB (ref 36.0–46.0)
HEMOGLOBIN: 10.1 g/dL — AB (ref 12.0–15.0)
Hemoglobin: 10.4 g/dL — ABNORMAL LOW (ref 12.0–15.0)
MCH: 28.6 pg (ref 26.0–34.0)
MCH: 28.3 pg (ref 26.0–34.0)
MCHC: 32.6 g/dL (ref 30.0–36.0)
MCHC: 32.9 g/dL (ref 30.0–36.0)
MCV: 87.8 fL (ref 78.0–100.0)
MCV: 86.1 fL (ref 78.0–100.0)
PLATELETS: 142 10*3/uL — AB (ref 150–400)
Platelets: 136 10*3/uL — ABNORMAL LOW (ref 150–400)
RBC: 3.53 MIL/uL — ABNORMAL LOW (ref 3.87–5.11)
RBC: 3.67 MIL/uL — ABNORMAL LOW (ref 3.87–5.11)
RDW: 14.8 % (ref 11.5–15.5)
RDW: 14.9 % (ref 11.5–15.5)
WBC: 17.3 10*3/uL — ABNORMAL HIGH (ref 4.0–10.5)
WBC: 18 10*3/uL — AB (ref 4.0–10.5)

## 2013-10-13 LAB — POCT I-STAT, CHEM 8
BUN: 9 mg/dL (ref 6–23)
CHLORIDE: 99 meq/L (ref 96–112)
CREATININE: 0.8 mg/dL (ref 0.50–1.10)
Calcium, Ion: 1.06 mmol/L — ABNORMAL LOW (ref 1.13–1.30)
GLUCOSE: 150 mg/dL — AB (ref 70–99)
HEMATOCRIT: 33 % — AB (ref 36.0–46.0)
Hemoglobin: 11.2 g/dL — ABNORMAL LOW (ref 12.0–15.0)
POTASSIUM: 4.1 meq/L (ref 3.7–5.3)
Sodium: 135 mEq/L — ABNORMAL LOW (ref 137–147)
TCO2: 24 mmol/L (ref 0–100)

## 2013-10-13 LAB — POCT I-STAT 4, (NA,K, GLUC, HGB,HCT)
GLUCOSE: 142 mg/dL — AB (ref 70–99)
HCT: 28 % — ABNORMAL LOW (ref 36.0–46.0)
HEMOGLOBIN: 9.5 g/dL — AB (ref 12.0–15.0)
Potassium: 4.3 mEq/L (ref 3.7–5.3)
Sodium: 139 mEq/L (ref 137–147)

## 2013-10-13 LAB — CREATININE, SERUM
Creatinine, Ser: 0.73 mg/dL (ref 0.50–1.10)
GFR calc Af Amer: >90 mL/min (ref >90)
GFR calc non Af Amer: 85 mL/min — ABNORMAL LOW (ref >90)

## 2013-10-13 LAB — MAGNESIUM
Magnesium: 2.5 mg/dL (ref 1.5–2.5)
Magnesium: 1.9 mg/dL (ref 1.5–2.5)

## 2013-10-13 MED ORDER — INSULIN ASPART 100 UNIT/ML ~~LOC~~ SOLN
0.0000 [IU] | SUBCUTANEOUS | Status: DC
Start: 2013-10-13 — End: 2013-10-15
  Administered 2013-10-13 (×4): 2 [IU] via SUBCUTANEOUS
  Administered 2013-10-14: 8 [IU] via SUBCUTANEOUS
  Administered 2013-10-14 – 2013-10-15 (×4): 2 [IU] via SUBCUTANEOUS
  Administered 2013-10-15 (×2): 4 [IU] via SUBCUTANEOUS
  Administered 2013-10-15: 2 [IU] via SUBCUTANEOUS

## 2013-10-13 MED ORDER — INSULIN DETEMIR 100 UNIT/ML ~~LOC~~ SOLN
20.0000 [IU] | Freq: Two times a day (BID) | SUBCUTANEOUS | Status: AC
  Administered 2013-10-13 (×2): 20 [IU] via SUBCUTANEOUS
  Filled 2013-10-13 (×2): qty 0.2

## 2013-10-13 MED ORDER — TRAMADOL HCL 50 MG PO TABS
50.0000 mg | ORAL_TABLET | ORAL | Status: DC | PRN
  Administered 2013-10-13 – 2013-10-20 (×10): 50 mg via ORAL
  Filled 2013-10-13 (×11): qty 1

## 2013-10-13 MED ORDER — LEVALBUTEROL HCL 0.63 MG/3ML IN NEBU
INHALATION_SOLUTION | RESPIRATORY_TRACT | Status: AC
  Filled 2013-10-13: qty 3

## 2013-10-13 MED ORDER — FENTANYL CITRATE 0.05 MG/ML IJ SOLN
25.0000 ug | INTRAMUSCULAR | Status: DC | PRN
  Filled 2013-10-13: qty 2

## 2013-10-13 MED ORDER — FENTANYL CITRATE 0.05 MG/ML IJ SOLN
50.0000 ug | INTRAMUSCULAR | Status: DC | PRN
  Administered 2013-10-12: 100 ug via INTRAVENOUS
  Administered 2013-10-13 – 2013-10-14 (×23): 50 ug via INTRAVENOUS
  Filled 2013-10-13 (×22): qty 2

## 2013-10-13 MED ORDER — ENOXAPARIN SODIUM 40 MG/0.4ML ~~LOC~~ SOLN
40.0000 mg | Freq: Every day | SUBCUTANEOUS | Status: DC
  Administered 2013-10-13 – 2013-10-19 (×7): 40 mg via SUBCUTANEOUS
  Filled 2013-10-13 (×8): qty 0.4

## 2013-10-13 MED ORDER — INSULIN DETEMIR 100 UNIT/ML ~~LOC~~ SOLN
20.0000 [IU] | Freq: Every day | SUBCUTANEOUS | Status: DC
  Administered 2013-10-14 – 2013-10-20 (×7): 20 [IU] via SUBCUTANEOUS
  Filled 2013-10-13 (×7): qty 0.2

## 2013-10-13 MED ORDER — FUROSEMIDE 10 MG/ML IJ SOLN
40.0000 mg | Freq: Two times a day (BID) | INTRAMUSCULAR | Status: AC
  Administered 2013-10-13: 40 mg via INTRAVENOUS
  Filled 2013-10-13 (×2): qty 4

## 2013-10-13 MED ORDER — LEVALBUTEROL HCL 0.63 MG/3ML IN NEBU
0.6300 mg | INHALATION_SOLUTION | Freq: Four times a day (QID) | RESPIRATORY_TRACT | Status: DC
  Administered 2013-10-13 – 2013-10-14 (×6): 0.63 mg via RESPIRATORY_TRACT
  Filled 2013-10-13 (×12): qty 3

## 2013-10-13 MED FILL — Dexmedetomidine HCl IV Soln 200 MCG/2ML: INTRAVENOUS | Qty: 2 | Status: AC

## 2013-10-13 MED FILL — Magnesium Sulfate Inj 50%: INTRAMUSCULAR | Qty: 10 | Status: AC

## 2013-10-13 MED FILL — Heparin Sodium (Porcine) Inj 1000 Unit/ML: INTRAMUSCULAR | Qty: 30 | Status: AC

## 2013-10-13 MED FILL — Potassium Chloride Inj 2 mEq/ML: INTRAVENOUS | Qty: 40 | Status: AC

## 2013-10-13 NOTE — Op Note (Signed)
CARDIOVASCULAR SURGERY OPERATIVE NOTE  10/13/2013  Surgeon:  [name], MD  First Assistant: Jadene Pierini,  PA-C   Preoperative Diagnosis:    1. Severe multi-vessel coronary artery disease  2. Mild aortic stenosis and moderate aortic insufficiency   Postoperative Diagnosis:    1. Severe multi-vessel coronary artery disease  2. Mild aortic stenosis and moderate to severe aortic insufficiency   Procedure:  1. Median Sternotomy 2. Extracorporeal circulation 3.   Coronary artery bypass grafting x 2   Left internal mammary graft to the LAD  SVG to RCA  4.   Endoscopic vein harvest from the right leg\ 5.   Aortic valve replacement using a 21 mm Weintraub Magna-Ease pericardial valve.   Anesthesia:  General Endotracheal   Clinical History/Surgical Indication:  The patient has a history of hyperlipidemia, diabetes, ongoing smoking and COPD, and known coronary artery disease s/p DES to RCA and mid LCX in May 2009. She reports episodic chest pain for the past month that has been worsening and occuring at rest and with exertion, and awakening her from sleep. She has been using SL NTG spray with relief. Last week she had a pharmacologic myoview at [facility] that was normal. On the day prior to admission she developed severe pain after eating, not relieved with NTG. Troponin was negative x 2. Cath yesterday afternoon showed severe multi-vessel coronary disease. The LAD had 95% stenosis proximally. The LCX stent was patent with diffuse 30% restenosis. The RCA has a 90% ostial stenosis with a patent proximal stent and 50% mid vessel stenosis. She had recurrent chest pain twice this am which says was related to her NTG pump stopping. She also had some pain while talking to me today while on NTG.   She has a history of mild AS and moderate AI seen on echo on 09/30/12. The mean gradient was 12 mm Hg  and peak 24. AVA was 2.28cm2 by VTI and 1.91 by Vmax. A recent echo at [facility] on 10/03/13 showed an increase in the mean gradient to 17 and an increase in the peak to 28 with an AVA of 1.5 cm2. There is still moderate AI with a slight decrease in the LVEF to 50-55% compared to last year when the LVEF was 55-60%. There was no gradient measured at cath.   She has severe multi-vessel CAD with high grade proximal LAD and ostial RCA stenoses and unstable anginal symptoms. I agree that CABG is the best treatment for this. I don't think the LCX requires grafting since there is insignificant stenosis. She also has mild aortic stenosis with a slightly higher gradient than last year and moderate AI that was present last year on echo and was trivial in 02/2010 but not commented about on the echo in 07/2008. I don't have access to the echo done recently at [place] but I would look at her TEE in the OR and decide if the aortic valve should be replaced at the same time. Her AS is mild but the AI may be more of a problem as time passes. I would like to do surgery on Tuesday am if she remains stable on NTG. I would also start heparin. If she continues to have pain on heparin and NTG then I would plan to do Monday afternoon. I discussed the operative procedure with the patient and her husband including alternatives, benefits and risks; including but not limited to bleeding, blood transfusion, infection, stroke, myocardial infarction, graft failure, heart block requiring a permanent pacemaker,  organ dysfunction, and death. Freada Bergeron understands and agrees to proceed. I discussed possible aortic valve replacement depending on the TEE and I would use a tissue valve. They are in agreement with that.    Preparation:  The patient was seen in the preoperative holding area and the correct patient, correct operation were confirmed with the patient after reviewing the medical record and catheterization. The consent was signed by  me. Preoperative antibiotics were given. A pulmonary arterial line and radial arterial line were placed by the anesthesia team. The patient was taken back to the operating room and positioned supine on the operating room table. After being placed under general endotracheal anesthesia by the anesthesia team a foley catheter was placed. The neck, chest, abdomen, and both legs were prepped with betadine soap and solution and draped in the usual sterile manner. A surgical time-out was taken and the correct patient and operative procedure were confirmed with the nursing and anesthesia staff.  TEE: Performed by Dr. Roberts Gaudy and Dr. Annye Asa. This showed mild aortic stenosis with moderate to severe AI. The AI was really right at the borderline of severe. It was impressive and there was fluttering of the anterior mitral leaflet from the jet. There was LV dilatation with an internal diameter greater than 5 cm. LV function was good. There was no MR. I felt that this valve should be replaced.    Cardiopulmonary Bypass:  A median sternotomy was performed. The pericardium was opened in the midline. Right ventricular function appeared normal. The ascending aorta was of normal size and had no palpable plaque. There were no contraindications to aortic cannulation or cross-clamping. The patient was fully systemically heparinized and the ACT was maintained > 400 sec. The proximal aortic arch was cannulated with a 75F aortic cannula for arterial inflow. Venous cannulation was performed via the right atrial appendage using a two-staged venous cannula. An antegrade cardioplegia/vent cannula was inserted into the mid-ascending aorta. Aortic occlusion was performed with a single cross-clamp. Systemic cooling to 32 degrees Centigrade and topical cooling of the heart with iced saline were used. Hyperkalemic antegrade  cold blood cardioplegia was used to induce diastolic arrest and was then antegrade and retrograde  cardioplegia was given at about 20 minute intervals throughout the period of arrest to maintain myocardial temperature at or below 10 degrees centigrade. A temperature probe was inserted into the interventricular septum and an insulating pad was placed in the pericardium.    Left internal mammary harvest:  The left side of the sternum was retracted using the Rultract retractor. The left internal mammary artery was harvested as a pedicle graft. All side branches were clipped. It was a medium-sized vessel of good quality with excellent blood flow. It was ligated distally and divided. It was sprayed with topical papaverine solution to prevent vasospasm.   Endoscopic vein harvest:  The right greater saphenous vein was harvested endoscopically through a 2 cm incision medial to the right knee. It was harvested from the upper thigh to below the knee. It was a medium-sized vein of good quality. The side branches were all ligated with 4-0 silk ties.    Coronary arteries:  The coronary arteries were examined.   LAD:  Heavily diseased proximally, mild distal disease  LCX:  Mild distal disease  RCA:  Mild disease throughout the mid and distal portion.   Grafts:  1. LIMA to the LAD: 2 mm. It was sewn end to side using 8-0 prolene continuous suture. 2. SVG  to RCA:  2.5 mm. It was sewn end to side using 7-0 prolene continuous suture.   The proximal vein graft anastomosis was performed to the mid-ascending aorta using continuous 6-0 prolene suture. A graft marker was placed around the proximal anastomosis.   Aortic Valve Replacement:   A transverse aortotomy was performed 1 cm above the take-off of the right coronary artery. The native valve was tricuspid with mildly calcified leaflets that were thickened and shortened and did not coapt in the center. The ostia of the coronary arteries were in normal position and were not obstructed. The native valve leaflets were excised and the annulus was  decalcified with rongeurs. Care was taken to remove all particulate debris. The left ventricle was directly inspected for debris and then irrigated with ice saline solution. The annulus was sized and a size 21 mm [employer] Ease pericardial valve was chosen. The model number was [id number] and the serial number was [id number].  While the valve was being prepared 2-0 Ethibond pledgeted horizontal mattress sutures were placed around the annulus with the pledgets in a sub-annular position. The sutures were placed through the sewing ring and the valve lowered into place. The sutures were tied sequentially. The valve seated nicely and the coronary ostia were not obstructed. The prosthetic valve leaflets moved normally and there was no sub-valvular obstruction. The aortotomy was closed using 4-0 Prolene suture in 2 layers with felt strips to reinforce the closure.  Completion:  The patient was rewarmed to 37 degrees Centigrade. The clamp was removed from the LIMA pedicle and there was rapid warming of the septum and return of ventricular fibrillation. The crossclamp was removed with a time of 104 minutes. There was spontaneous return of sinus rhythm. The distal and proximal anastomoses were checked for hemostasis. The position of the grafts was satisfactory. Two temporary epicardial pacing wires were placed on the right atrium and two on the right ventricle. The patient was weaned from CPB without difficulty on no inotropes. The aortic valve prosthesis was functioning normally with no perivalvular leak or regurgitation through the valve. There was no MR. LV function appeared preserved. CPB time was 127 minutes. Cardiac output was 6 LPM. Heparin was fully reversed with protamine and the aortic and venous cannulas removed. Hemostasis was achieved. Mediastinal and left pleural drainage tubes were placed. The sternum was closed with double #6 stainless steel wires. The fascia was closed with continuous # 1 vicryl suture. The  subcutaneous tissue was closed with 2-0 vicryl continuous suture. The skin was closed with 3-0 vicryl subcuticular suture. All sponge, needle, and instrument counts were reported correct at the end of the case. Dry sterile dressings were placed over the incisions and around the chest tubes which were connected to pleurevac suction. The patient was then transported to the surgical intensive care unit in critical but stable condition.

## 2013-10-13 NOTE — Progress Notes (Signed)
1 Day Post-Op Procedure(s) (LRB): CORONARY ARTERY BYPASS GRAFT TIMES TWO (N/A) AORTIC VALVE REPLACEMENT (AVR) (N/A) Subjective:  Complains of pain  Objective: Vital signs in last 24 hours: Temp:  [97.5 F (36.4 C)-99.7 F (37.6 C)] 99.1 F (37.3 C) (05/12 0832) Pulse Rate:  [64-95] 65 (05/12 0832) Cardiac Rhythm:  [-] Heart block;Atrial paced (05/11 2100) Resp:  [12-26] 18 (05/12 0832) BP: (89-171)/(51-106) 116/68 mmHg (05/12 0800) SpO2:  [94 %-100 %] 99 % (05/12 0832) FiO2 (%):  [40 %-100 %] 40 % (05/12 0055) Weight:  [86.2 kg (190 lb 0.6 oz)] 86.2 kg (190 lb 0.6 oz) (05/12 0515)  Hemodynamic parameters for last 24 hours: PAP: (24-53)/(10-33) 35/14 mmHg CO:  [3.7 L/min-7.2 L/min] 4.6 L/min CI:  [1.8 L/min/m2-3.5 L/min/m2] 2.3 L/min/m2  Intake/Output from previous day: 05/11 0701 - 05/12 0700 In: 5328.8 [I.V.:4266.8; Blood:462; IV Piggyback:600] Out: 6295 [Urine:2275; Blood:1700; Chest Tube:300] Intake/Output this shift: Total I/O In: 152.4 [P.O.:75; I.V.:77.4] Out: 110 [Urine:60; Chest Tube:50]  General appearance: alert and cooperative Neurologic: intact Heart: regular rate and rhythm, S1, S2 normal, no murmur, click, rub or gallop Lungs: clear to auscultation bilaterally Extremities: edema mild Wound: dressing dry  Lab Results:  Recent Labs  10/12/13 2020 10/13/13 0415  WBC 12.2* 17.3*  HGB 10.0* 10.1*  HCT 30.2* 31.0*  PLT 120* 136*   BMET:  Recent Labs  10/12/13 0350  10/12/13 2019 10/13/13 0415  NA 141  < > 139 137  K 4.4  < > 4.3 4.4  CL 105  --   --  106  CO2 24  --   --  20  GLUCOSE 123*  < > 142* 137*  BUN 14  --   --  11  CREATININE 0.74  --   --  0.73  CALCIUM 8.3*  --   --  7.2*  < > = values in this interval not displayed.  PT/INR:  Recent Labs  10/12/13 2020  LABPROT 16.8*  INR 1.40   ABG    Component Value Date/Time   PHART 7.291* 10/13/2013 0221   HCO3 23.9 10/13/2013 0221   TCO2 25 10/13/2013 0221   ACIDBASEDEF 3.0*  10/13/2013 0221   O2SAT 97.0 10/13/2013 0221   CBG (last 3)   Recent Labs  10/13/13 0340 10/13/13 0620 10/13/13 0804  GLUCAP 129* 150* 119*    Assessment/Plan: S/P Procedure(s) (LRB): CORONARY ARTERY BYPASS GRAFT TIMES TWO (N/A) AORTIC VALVE REPLACEMENT (AVR) (N/A) Mobilize Diuresis Diabetes control d/c tubes/lines Continue foley due to diuresing patient and patient in ICU See progression orders   LOS: 5 days    [name] 10/13/2013

## 2013-10-13 NOTE — Progress Notes (Signed)
[facility] BRIEF SICU PROGRESS NOTE  1 Day Post-Op  S/P Procedure(s) (LRB): CORONARY ARTERY BYPASS GRAFT TIMES TWO (N/A) AORTIC VALVE REPLACEMENT (AVR) (N/A)   Stable day NSR w/ stable BP O2 sats 95% on 2 L/min Diuresing well  Plan: Continue current plan  Rexene Alberts 10/13/2013 4:46 PM

## 2013-10-14 ENCOUNTER — Inpatient Hospital Stay (HOSPITAL_COMMUNITY): Payer: Medicare Other

## 2013-10-14 LAB — BASIC METABOLIC PANEL
BUN: 13 mg/dL (ref 6–23)
CHLORIDE: 99 meq/L (ref 96–112)
CO2: 25 meq/L (ref 19–32)
CREATININE: 0.66 mg/dL (ref 0.50–1.10)
Calcium: 7.4 mg/dL — ABNORMAL LOW (ref 8.4–10.5)
GFR calc Af Amer: >90 mL/min (ref >90)
GFR calc non Af Amer: 88 mL/min — ABNORMAL LOW (ref >90)
GLUCOSE: 139 mg/dL — AB (ref 70–99)
Potassium: 3.8 mEq/L (ref 3.7–5.3)
Sodium: 135 mEq/L — ABNORMAL LOW (ref 137–147)

## 2013-10-14 LAB — GLUCOSE, CAPILLARY
GLUCOSE-CAPILLARY: 155 mg/dL — AB (ref 70–99)
GLUCOSE-CAPILLARY: 212 mg/dL — AB (ref 70–99)
GLUCOSE-CAPILLARY: 157 mg/dL — AB (ref 70–99)
Glucose-Capillary: 137 mg/dL — ABNORMAL HIGH (ref 70–99)
Glucose-Capillary: 141 mg/dL — ABNORMAL HIGH (ref 70–99)
Glucose-Capillary: 152 mg/dL — ABNORMAL HIGH (ref 70–99)

## 2013-10-14 LAB — CBC
HEMATOCRIT: 29.6 % — AB (ref 36.0–46.0)
Hemoglobin: 9.7 g/dL — ABNORMAL LOW (ref 12.0–15.0)
MCH: 28.4 pg (ref 26.0–34.0)
MCHC: 32.8 g/dL (ref 30.0–36.0)
MCV: 86.8 fL (ref 78.0–100.0)
Platelets: 134 10*3/uL — ABNORMAL LOW (ref 150–400)
RBC: 3.41 MIL/uL — ABNORMAL LOW (ref 3.87–5.11)
RDW: 15.1 % (ref 11.5–15.5)
WBC: 18.4 10*3/uL — ABNORMAL HIGH (ref 4.0–10.5)

## 2013-10-14 MED ORDER — ALBUTEROL SULFATE HFA 108 (90 BASE) MCG/ACT IN AERS: 2.0000 | INHALATION_SPRAY | Freq: Four times a day (QID) | RESPIRATORY_TRACT | Status: DC | PRN

## 2013-10-14 MED ORDER — HYDROMORPHONE HCL PF 1 MG/ML IJ SOLN
1.0000 mg | INTRAMUSCULAR | Status: DC | PRN
  Administered 2013-10-15: 1 mg via INTRAVENOUS
  Filled 2013-10-14: qty 1

## 2013-10-14 MED ORDER — ALBUTEROL SULFATE (2.5 MG/3ML) 0.083% IN NEBU
2.5000 mg | INHALATION_SOLUTION | Freq: Four times a day (QID) | RESPIRATORY_TRACT | Status: DC | PRN
  Administered 2013-10-14 – 2013-10-15 (×2): 2.5 mg via RESPIRATORY_TRACT
  Filled 2013-10-14 (×2): qty 3

## 2013-10-14 MED ORDER — FUROSEMIDE 10 MG/ML IJ SOLN
40.0000 mg | Freq: Two times a day (BID) | INTRAMUSCULAR | Status: AC
  Administered 2013-10-14 (×2): 40 mg via INTRAVENOUS
  Filled 2013-10-14 (×2): qty 4

## 2013-10-14 MED ORDER — HYDROMORPHONE HCL 2 MG PO TABS
2.0000 mg | ORAL_TABLET | ORAL | Status: DC | PRN
  Administered 2013-10-14 – 2013-10-15 (×6): 2 mg via ORAL
  Filled 2013-10-14 (×6): qty 1

## 2013-10-14 MED ORDER — FUROSEMIDE 40 MG PO TABS: 40.0000 mg | ORAL_TABLET | Freq: Every day | ORAL | Status: DC

## 2013-10-14 MED ORDER — POTASSIUM CHLORIDE CRYS ER 20 MEQ PO TBCR
20.0000 meq | EXTENDED_RELEASE_TABLET | Freq: Two times a day (BID) | ORAL | Status: DC
  Administered 2013-10-14 – 2013-10-15 (×3): 20 meq via ORAL
  Filled 2013-10-14 (×5): qty 1

## 2013-10-14 MED FILL — Heparin Sodium (Porcine) Inj 1000 Unit/ML: INTRAMUSCULAR | Qty: 20 | Status: AC

## 2013-10-14 MED FILL — Sodium Chloride IV Soln 0.9%: INTRAVENOUS | Qty: 2000 | Status: AC

## 2013-10-14 MED FILL — Lidocaine HCl IV Inj 20 MG/ML: INTRAVENOUS | Qty: 5 | Status: AC

## 2013-10-14 MED FILL — Electrolyte-R (PH 7.4) Solution: INTRAVENOUS | Qty: 4000 | Status: AC

## 2013-10-14 MED FILL — Mannitol IV Soln 20%: INTRAVENOUS | Qty: 500 | Status: AC

## 2013-10-14 MED FILL — Sodium Bicarbonate IV Soln 8.4%: INTRAVENOUS | Qty: 50 | Status: AC

## 2013-10-14 NOTE — Progress Notes (Addendum)
[address]       [place] [address]             [phone]       2 Days Post-Op Procedure(s) (LRB): CORONARY ARTERY BYPASS GRAFT TIMES TWO (N/A) AORTIC VALVE REPLACEMENT (AVR) (N/A)  Subjective:  [name] states she doesn't feel well this morning.  She states that she feels short of breath.    Objective: Vital signs in last 24 hours: Temp:  [98.5 F (36.9 C)-100.7 F (38.2 C)] 98.5 F (36.9 C) (05/13 0732) Pulse Rate:  [64-72] 68 (05/13 0700) Cardiac Rhythm:  [-] Heart block;Atrial paced (05/12 2000) Resp:  [13-26] 26 (05/13 0700) BP: (104-153)/(46-74) 131/52 mmHg (05/13 0700) SpO2:  [87 %-99 %] 91 % (05/13 0700) Weight:  [186 lb 8.2 oz (84.6 kg)] 186 lb 8.2 oz (84.6 kg) (05/13 0600)  Hemodynamic parameters for last 24 hours: PAP: (31-35)/(11-14) 32/13 mmHg CO:  [4.9 L/min] 4.9 L/min CI:  [2.4 L/min/m2] 2.4 L/min/m2  Intake/Output from previous day: 05/12 0701 - 05/13 0700 In: 1024.8 [P.O.:315; I.V.:609.8; IV Piggyback:100] Out: 2615 [Urine:2515; Chest Tube:100]  General appearance: alert, cooperative and no distress Heart: regular rate and rhythm Lungs: clear to auscultation bilaterally Abdomen: soft, non-tender; bowel sounds normal; no masses,  no organomegaly Extremities: edema trace Wound: clean and dry EVH site, some blood tinged drainage from sternotomy  Lab Results:  Recent Labs  10/13/13 1611 10/13/13 1629 10/14/13 0430  WBC 18.0*  --  18.4*  HGB 10.4* 11.2* 9.7*  HCT 31.6* 33.0* 29.6*  PLT 142*  --  134*   BMET:  Recent Labs  10/13/13 0415  10/13/13 1629 10/14/13 0430  NA 137  --  135* 135*  K 4.4  --  4.1 3.8  CL 106  --  99 99  CO2 20  --   --  25  GLUCOSE 137*  --  150* 139*  BUN 11  --  9 13  CREATININE 0.73  < > 0.80 0.66  CALCIUM 7.2*  --   --  7.4*  < > = values in this interval not displayed.  PT/INR:  Recent Labs  10/12/13 2020  LABPROT 16.8*  INR 1.40   ABG    Component Value Date/Time     PHART 7.291* 10/13/2013 0221   HCO3 23.9 10/13/2013 0221   TCO2 24 10/13/2013 1629   ACIDBASEDEF 3.0* 10/13/2013 0221   O2SAT 97.0 10/13/2013 0221   CBG (last 3)   Recent Labs  10/13/13 1916 10/13/13 2325 10/14/13 0340  GLUCAP 156* 134* 137*    Assessment/Plan: S/P Procedure(s) (LRB): CORONARY ARTERY BYPASS GRAFT TIMES TWO (N/A) AORTIC VALVE REPLACEMENT (AVR) (N/A)  1. CV- Sinus Brady, off all drips, pressure high at times- will continue Lopressor, will monitor blood pressure can add additional agent if doesn't improve 2. Pulm- continued pulmonary congestion, encouraged use of IS, ordered home albuterol inhalers, nebs prn 3. Renal- creatinine WNL, mild hypervolemia, has completed IV lasix, will start PO lasix, add potassium supplementation 4. Expected Blood Loss Anemia- Hgb 9.7, will monitor 5. DM- CBGs controlled, continue current insulin regimen 6. Dispo- patient with shortness of breath this morning, this is most likely aggravated by pain, patient not breathing all that deeply, remains stable off all drips, d/c foley, keep in ICU today   LOS: 6 days    [name] 10/14/2013  She is stable overall. CXR still shows mild edema so will continue IV diuresis today. Still  having a lot of chest wall pain. Will try a dose of po dilaudid which she has not had before. She has had morphine in the past but says she got some chest pain with that. Will see how she tolerates dilaudid while she is in the ICU. She needs to ambulate.

## 2013-10-14 NOTE — Progress Notes (Signed)
Just back to bed  C/o incisional pain  Blood pressure 140/50, pulse 70, temperature 98.6 F (37 C), temperature source Oral, resp. rate 27, height 5\' 6"  (1.676 m), weight 186 lb 8.2 oz (84.6 kg), SpO2 96.00%.   Intake/Output Summary (Last 24 hours) at 10/14/13 1712 Last data filed at 10/14/13 1400  Gross per 24 hour  Intake   1020 ml  Output   1740 ml  Net   -720 ml   CBG 130-160 range  Continue current care

## 2013-10-15 ENCOUNTER — Inpatient Hospital Stay (HOSPITAL_COMMUNITY): Payer: Medicare Other

## 2013-10-15 LAB — TYPE AND SCREEN
ABO/RH(D): B POS
Antibody Screen: NEGATIVE
UNIT DIVISION: 0
UNIT DIVISION: 0
Unit division: 0
Unit division: 0

## 2013-10-15 LAB — BASIC METABOLIC PANEL
BUN: 19 mg/dL (ref 6–23)
CO2: 30 mEq/L (ref 19–32)
Calcium: 7.6 mg/dL — ABNORMAL LOW (ref 8.4–10.5)
Chloride: 99 mEq/L (ref 96–112)
Creatinine, Ser: 0.71 mg/dL (ref 0.50–1.10)
GFR, EST NON AFRICAN AMERICAN: 86 mL/min — AB (ref >90)
Glucose, Bld: 119 mg/dL — ABNORMAL HIGH (ref 70–99)
POTASSIUM: 4.5 meq/L (ref 3.7–5.3)
Sodium: 138 mEq/L (ref 137–147)

## 2013-10-15 LAB — CBC
HCT: 27.5 % — ABNORMAL LOW (ref 36.0–46.0)
Hemoglobin: 8.9 g/dL — ABNORMAL LOW (ref 12.0–15.0)
MCH: 28.5 pg (ref 26.0–34.0)
MCHC: 32.4 g/dL (ref 30.0–36.0)
MCV: 88.1 fL (ref 78.0–100.0)
PLATELETS: 120 10*3/uL — AB (ref 150–400)
RBC: 3.12 MIL/uL — ABNORMAL LOW (ref 3.87–5.11)
RDW: 15.4 % (ref 11.5–15.5)
WBC: 14 10*3/uL — AB (ref 4.0–10.5)

## 2013-10-15 LAB — GLUCOSE, CAPILLARY
GLUCOSE-CAPILLARY: 118 mg/dL — AB (ref 70–99)
GLUCOSE-CAPILLARY: 180 mg/dL — AB (ref 70–99)
Glucose-Capillary: 118 mg/dL — ABNORMAL HIGH (ref 70–99)
Glucose-Capillary: 150 mg/dL — ABNORMAL HIGH (ref 70–99)
Glucose-Capillary: 155 mg/dL — ABNORMAL HIGH (ref 70–99)
Glucose-Capillary: 145 mg/dL — ABNORMAL HIGH (ref 70–99)

## 2013-10-15 MED ORDER — POTASSIUM CHLORIDE CRYS ER 20 MEQ PO TBCR
20.0000 meq | EXTENDED_RELEASE_TABLET | Freq: Two times a day (BID) | ORAL | Status: DC
  Administered 2013-10-15 – 2013-10-19 (×8): 20 meq via ORAL
  Filled 2013-10-15 (×11): qty 1

## 2013-10-15 MED ORDER — METOPROLOL TARTRATE 12.5 MG HALF TABLET
12.5000 mg | ORAL_TABLET | Freq: Two times a day (BID) | ORAL | Status: DC
  Administered 2013-10-15 – 2013-10-18 (×6): 12.5 mg via ORAL
  Filled 2013-10-15 (×11): qty 1

## 2013-10-15 MED ORDER — SODIUM CHLORIDE 0.9 % IV SOLN: 250.0000 mL | INTRAVENOUS | Status: DC | PRN

## 2013-10-15 MED ORDER — PANTOPRAZOLE SODIUM 40 MG PO TBEC
40.0000 mg | DELAYED_RELEASE_TABLET | Freq: Every day | ORAL | Status: DC
  Administered 2013-10-16 – 2013-10-20 (×5): 40 mg via ORAL
  Filled 2013-10-15 (×5): qty 1

## 2013-10-15 MED ORDER — SODIUM CHLORIDE 0.9 % IJ SOLN: 3.0000 mL | INTRAMUSCULAR | Status: DC | PRN

## 2013-10-15 MED ORDER — FUROSEMIDE 10 MG/ML IJ SOLN
40.0000 mg | Freq: Two times a day (BID) | INTRAMUSCULAR | Status: AC
  Administered 2013-10-15 (×2): 40 mg via INTRAVENOUS
  Filled 2013-10-15 (×2): qty 4

## 2013-10-15 MED ORDER — ONDANSETRON HCL 4 MG PO TABS
4.0000 mg | ORAL_TABLET | Freq: Four times a day (QID) | ORAL | Status: DC | PRN
  Administered 2013-10-19 – 2013-10-20 (×3): 4 mg via ORAL
  Filled 2013-10-15 (×3): qty 1

## 2013-10-15 MED ORDER — BISACODYL 5 MG PO TBEC
10.0000 mg | DELAYED_RELEASE_TABLET | Freq: Every day | ORAL | Status: DC | PRN
  Administered 2013-10-19: 10 mg via ORAL
  Filled 2013-10-15 (×2): qty 2

## 2013-10-15 MED ORDER — LACTULOSE 10 GM/15ML PO SOLN
20.0000 g | Freq: Every day | ORAL | Status: DC | PRN
  Administered 2013-10-16: 20 g via ORAL
  Filled 2013-10-15: qty 30

## 2013-10-15 MED ORDER — BIOTENE DRY MOUTH MT LIQD
15.0000 mL | Freq: Two times a day (BID) | OROMUCOSAL | Status: DC
  Administered 2013-10-15 – 2013-10-20 (×9): 15 mL via OROMUCOSAL

## 2013-10-15 MED ORDER — ONDANSETRON HCL 4 MG/2ML IJ SOLN
4.0000 mg | Freq: Four times a day (QID) | INTRAMUSCULAR | Status: DC | PRN
  Administered 2013-10-17: 4 mg via INTRAVENOUS
  Filled 2013-10-15: qty 2

## 2013-10-15 MED ORDER — FUROSEMIDE 40 MG PO TABS
40.0000 mg | ORAL_TABLET | Freq: Two times a day (BID) | ORAL | Status: DC
  Administered 2013-10-16 – 2013-10-19 (×7): 40 mg via ORAL
  Filled 2013-10-15 (×10): qty 1

## 2013-10-15 MED ORDER — TRAZODONE HCL 100 MG PO TABS
100.0000 mg | ORAL_TABLET | Freq: Every day | ORAL | Status: DC
  Administered 2013-10-15 – 2013-10-19 (×5): 100 mg via ORAL
  Filled 2013-10-15 (×6): qty 1

## 2013-10-15 MED ORDER — INSULIN ASPART 100 UNIT/ML ~~LOC~~ SOLN
0.0000 [IU] | Freq: Three times a day (TID) | SUBCUTANEOUS | Status: DC
  Administered 2013-10-15 – 2013-10-17 (×5): 2 [IU] via SUBCUTANEOUS
  Administered 2013-10-18: 4 [IU] via SUBCUTANEOUS
  Administered 2013-10-19 (×3): 2 [IU] via SUBCUTANEOUS

## 2013-10-15 MED ORDER — SODIUM CHLORIDE 0.9 % IJ SOLN
3.0000 mL | Freq: Two times a day (BID) | INTRAMUSCULAR | Status: DC
  Administered 2013-10-15 – 2013-10-20 (×9): 3 mL via INTRAVENOUS

## 2013-10-15 MED ORDER — MOVING RIGHT ALONG BOOK
Freq: Once | Status: AC
  Administered 2013-10-15: 20:00:00
  Filled 2013-10-15: qty 1

## 2013-10-15 MED ORDER — ALBUTEROL SULFATE (2.5 MG/3ML) 0.083% IN NEBU
2.5000 mg | INHALATION_SOLUTION | Freq: Four times a day (QID) | RESPIRATORY_TRACT | Status: DC | PRN
  Administered 2013-10-15 – 2013-10-20 (×5): 2.5 mg via RESPIRATORY_TRACT
  Filled 2013-10-15 (×5): qty 3

## 2013-10-15 MED ORDER — HYDROMORPHONE HCL 2 MG PO TABS
4.0000 mg | ORAL_TABLET | ORAL | Status: DC | PRN
Start: 2013-10-15 — End: 2013-10-19
  Administered 2013-10-16: 4 mg via ORAL
  Administered 2013-10-16: 2 mg via ORAL
  Administered 2013-10-17 – 2013-10-19 (×13): 4 mg via ORAL
  Filled 2013-10-15 (×16): qty 2

## 2013-10-15 MED ORDER — BISACODYL 10 MG RE SUPP: 10.0000 mg | Freq: Every day | RECTAL | Status: DC | PRN

## 2013-10-15 MED ORDER — ASPIRIN EC 325 MG PO TBEC
325.0000 mg | DELAYED_RELEASE_TABLET | Freq: Every day | ORAL | Status: DC
  Administered 2013-10-16 – 2013-10-20 (×5): 325 mg via ORAL
  Filled 2013-10-15 (×6): qty 1

## 2013-10-15 MED ORDER — DOCUSATE SODIUM 100 MG PO CAPS
200.0000 mg | ORAL_CAPSULE | Freq: Every day | ORAL | Status: DC
  Administered 2013-10-15 – 2013-10-19 (×5): 200 mg via ORAL
  Filled 2013-10-15 (×6): qty 2

## 2013-10-15 NOTE — Progress Notes (Signed)
CT surgery p.m. Rounds  Alert sitting up in bed Walked in the hallway  Intake output balance -350 with diuresis Waiting for step down bed

## 2013-10-15 NOTE — Progress Notes (Addendum)
      [address]       [place] [address]             [phone]      3 Days Post-Op Procedure(s) (LRB): CORONARY ARTERY BYPASS GRAFT TIMES TWO (N/A) AORTIC VALVE REPLACEMENT (AVR) (N/A)  Subjective:  Ms. Gradillas is feeling better this morning.  She states she has been able to tolerate Dilaudid for her pain.  She has ambulated some.  No BM  Objective: Vital signs in last 24 hours: Temp:  [98.1 F (36.7 C)-99.2 F (37.3 C)] 98.1 F (36.7 C) (05/14 0735) Pulse Rate:  [62-72] 64 (05/14 0600) Cardiac Rhythm:  [-] Normal sinus rhythm (05/14 0400) Resp:  [16-31] 19 (05/14 0600) BP: (109-140)/(39-62) 132/49 mmHg (05/14 0600) SpO2:  [91 %-99 %] 98 % (05/14 0600) Weight:  [184 lb 15.5 oz (83.9 kg)] 184 lb 15.5 oz (83.9 kg) (05/14 0500)  Intake/Output from previous day: 05/13 0701 - 05/14 0700 In: 3557 [P.O.:505; I.V.:460; IV Piggyback:50] Out: 3220 [URKYH:0623]  General appearance: alert, cooperative and no distress Heart: regular rate and rhythm Lungs: clear to auscultation bilaterally Abdomen: soft, non-tender; bowel sounds normal; no masses,  no organomegaly Extremities: edema trace Wound: clean, bloody drainage present from sternotomy  Lab Results:  Recent Labs  10/14/13 0430 10/15/13 0330  WBC 18.4* 14.0*  HGB 9.7* 8.9*  HCT 29.6* 27.5*  PLT 134* 120*   BMET:  Recent Labs  10/14/13 0430 10/15/13 0330  NA 135* 138  K 3.8 4.5  CL 99 99  CO2 25 30  GLUCOSE 139* 119*  BUN 13 19  CREATININE 0.66 0.71  CALCIUM 7.4* 7.6*    PT/INR:  Recent Labs  10/12/13 2020  LABPROT 16.8*  INR 1.40   ABG    Component Value Date/Time   PHART 7.291* 10/13/2013 0221   HCO3 23.9 10/13/2013 0221   TCO2 24 10/13/2013 1629   ACIDBASEDEF 3.0* 10/13/2013 0221   O2SAT 97.0 10/13/2013 0221   CBG (last 3)   Recent Labs  10/14/13 2331 10/15/13 0350 10/15/13 0545  GLUCAP 152* 118* 118*    Assessment/Plan: S/P Procedure(s) (LRB): CORONARY ARTERY  BYPASS GRAFT TIMES TWO (N/A) AORTIC VALVE REPLACEMENT (AVR) (N/A)  1. CV- NSR, blood pressure improved- continue Lopressor 2. Pulm- continued pulmonary edema, per patient breathing treatments, inhaler helped, wean oxygen as tolerated, encouraged IS 3. Renal- creatinine WNL, weight continues to improve, continued pulmonary edema on CXR, will repeat Lasix  4. ID-low grade temp this morning, white count trending down, likely SIRS/Atelectasis, however foley remains in place, also suggestion of infiltrates on CXR will monitor 5. DM- CBGS controlled,  Continue insulin regimen 6. Expected blood loss anemia- Hgb 8.9, stable 7. Dispo- patient feeling better, continued pulmonary edema continue Lasix, possibly transfer to circle today   LOS: 7 days    Ellwood Handler 10/15/2013   Chart reviewed, patient examined, agree with above. Her CXR still shows some pulmonary edema. Will continue diuresis. Her weight is only a couple lbs over preop. I think she can go to the circle today.

## 2013-10-16 ENCOUNTER — Inpatient Hospital Stay (HOSPITAL_COMMUNITY): Payer: Medicare Other

## 2013-10-16 LAB — GLUCOSE, CAPILLARY
GLUCOSE-CAPILLARY: 129 mg/dL — AB (ref 70–99)
GLUCOSE-CAPILLARY: 106 mg/dL — AB (ref 70–99)
Glucose-Capillary: 186 mg/dL — ABNORMAL HIGH (ref 70–99)
Glucose-Capillary: 130 mg/dL — ABNORMAL HIGH (ref 70–99)
Glucose-Capillary: 122 mg/dL — ABNORMAL HIGH (ref 70–99)
Glucose-Capillary: 148 mg/dL — ABNORMAL HIGH (ref 70–99)
Glucose-Capillary: 144 mg/dL — ABNORMAL HIGH (ref 70–99)

## 2013-10-16 LAB — BASIC METABOLIC PANEL
BUN: 18 mg/dL (ref 6–23)
CALCIUM: 8 mg/dL — AB (ref 8.4–10.5)
CO2: 32 mEq/L (ref 19–32)
Chloride: 96 mEq/L (ref 96–112)
Creatinine, Ser: 0.68 mg/dL (ref 0.50–1.10)
GFR, EST NON AFRICAN AMERICAN: 87 mL/min — AB (ref >90)
GLUCOSE: 128 mg/dL — AB (ref 70–99)
Potassium: 4.7 mEq/L (ref 3.7–5.3)
Sodium: 137 mEq/L (ref 137–147)

## 2013-10-16 LAB — CBC
HEMATOCRIT: 27.4 % — AB (ref 36.0–46.0)
Hemoglobin: 8.8 g/dL — ABNORMAL LOW (ref 12.0–15.0)
MCH: 28.4 pg (ref 26.0–34.0)
MCHC: 32.1 g/dL (ref 30.0–36.0)
MCV: 88.4 fL (ref 78.0–100.0)
Platelets: 127 10*3/uL — ABNORMAL LOW (ref 150–400)
RBC: 3.1 MIL/uL — ABNORMAL LOW (ref 3.87–5.11)
RDW: 15.2 % (ref 11.5–15.5)
WBC: 12.6 10*3/uL — ABNORMAL HIGH (ref 4.0–10.5)

## 2013-10-16 MED ORDER — SULFAMETHOXAZOLE-TMP DS 800-160 MG PO TABS
1.0000 | ORAL_TABLET | Freq: Two times a day (BID) | ORAL | Status: DC
  Administered 2013-10-16 – 2013-10-20 (×9): 1 via ORAL
  Filled 2013-10-16 (×10): qty 1

## 2013-10-16 NOTE — Progress Notes (Addendum)
      [address]       [place] [address]             [phone]      4 Days Post-Op Procedure(s) (LRB): CORONARY ARTERY BYPASS GRAFT TIMES TWO (N/A) AORTIC VALVE REPLACEMENT (AVR) (N/A)  Subjective:  [name] states she feels "dry" this morning. She otherwise has no complaints.  No BM, + flatus  Objective: Vital signs in last 24 hours: Temp:  [98.1 F (36.7 C)-99.8 F (37.7 C)] 99.8 F (37.7 C) (05/15 0727) Pulse Rate:  [62-88] 68 (05/15 0700) Cardiac Rhythm:  [-] Normal sinus rhythm (05/15 0700) Resp:  [17-29] 26 (05/15 0700) BP: (109-142)/(45-67) 109/48 mmHg (05/15 0400) SpO2:  [88 %-100 %] 99 % (05/15 0700) Weight:  [182 lb 1.6 oz (82.6 kg)] 182 lb 1.6 oz (82.6 kg) (05/15 0500)  Intake/Output from previous day: 05/14 0701 - 05/15 0700 In: 1020 [P.O.:980; I.V.:40] Out: 1915 [Urine:1915]  General appearance: alert, cooperative and no distress Heart: regular rate and rhythm Lungs: clear to auscultation bilaterally Abdomen: soft, non-tender; bowel sounds normal; no masses,  no organomegaly Extremities: edema trace Wound: clean and dry  Lab Results:  Recent Labs  10/15/13 0330 10/16/13 0200  WBC 14.0* 12.6*  HGB 8.9* 8.8*  HCT 27.5* 27.4*  PLT 120* 127*   BMET:  Recent Labs  10/15/13 0330 10/16/13 0200  NA 138 137  K 4.5 4.7  CL 99 96  CO2 30 32  GLUCOSE 119* 128*  BUN 19 18  CREATININE 0.71 0.68  CALCIUM 7.6* 8.0*    PT/INR: No results found for this basename: LABPROT, INR,  in the last 72 hours ABG    Component Value Date/Time   PHART 7.291* 10/13/2013 0221   HCO3 23.9 10/13/2013 0221   TCO2 24 10/13/2013 1629   ACIDBASEDEF 3.0* 10/13/2013 0221   O2SAT 97.0 10/13/2013 0221   CBG (last 3)   Recent Labs  10/15/13 2236 10/15/13 2328 10/16/13 0347  GLUCAP 145* 130* 122*    Assessment/Plan: S/P Procedure(s) (LRB): CORONARY ARTERY BYPASS GRAFT TIMES TWO (N/A) AORTIC VALVE REPLACEMENT (AVR) (N/A)  1. CV- NSR,  pressure continues to improve- continue Lopressor 2. Pulm- CXR with some improvement of pulmonary edema/atelectasis continue IS, nebs prn 3. Renal- creatinine WNL, weight has reached baseline, will start PO Lasix 4. ID- continues to have low grade temp this morning, no leukocytosis present, no acute signs of infection 5. DM- CBGs remained controlled, continue insulin regimen 6. Dispo- patient stable, will start PO Lasix, d/c foley, awaiting transfer to the floor  LOS: 8 days    Ellwood Handler 10/16/2013   Chart reviewed, patient examined, agree with above. She denies pain today, slept well after Trazodone. She has some serosanguinous drainage from the chest incision but the incision looks ok and the sternum feels stable. I suspect this is from her coughing and using her upper body to move around a lot in bed post-op. Will put her on Bactrim until this stops. I think she can go to a regular 2000 bed.

## 2013-10-16 NOTE — Progress Notes (Signed)
Pt ambulated partway from 2S, remainder in [facility] to room 2W33. She has sternal pain with ambulation. Pt has to reminded consistently to utilize sternal precautions.

## 2013-10-17 ENCOUNTER — Other Ambulatory Visit: Payer: Self-pay | Admitting: Family Medicine

## 2013-10-17 LAB — GLUCOSE, CAPILLARY
GLUCOSE-CAPILLARY: 97 mg/dL (ref 70–99)
GLUCOSE-CAPILLARY: 159 mg/dL — AB (ref 70–99)
Glucose-Capillary: 130 mg/dL — ABNORMAL HIGH (ref 70–99)
Glucose-Capillary: 131 mg/dL — ABNORMAL HIGH (ref 70–99)
Glucose-Capillary: 114 mg/dL — ABNORMAL HIGH (ref 70–99)
Glucose-Capillary: 94 mg/dL (ref 70–99)

## 2013-10-17 NOTE — Progress Notes (Signed)
Patient Requesting medication for nausea, zofran given as ordered for nausea will continue to monitor patient. Megan Gavia Zohaib Heeney RN

## 2013-10-17 NOTE — Progress Notes (Signed)
Nursing note  Patient ambulated in hallway 490 feet. Gait steady, slightly short of breath after walk. Back in chair will monitor patient. Bettina Gavia Alazay Leicht RN

## 2013-10-17 NOTE — Progress Notes (Signed)
CARDIAC REHAB PHASE I   PRE:  Rate/Rhythm: 85 SR  BP:  Supine: 122/60  Sitting:   Standing:    SaO2: 95 2 L  MODE:  Ambulation: 160 ft   POST:  Rate/Rhythm: 75 SR  BP:  Supine: 110/50  Sitting:   Standing:    SaO2: 86 2 L Very whiny, reluctant to move, upset she had to wait for pain medicine last night.  IS encouraged and walking 3 times daily.  Desaturated to 86% on 2 liters, patient unable to purse lip breathe due to a deviated septum.  Given lots of support and encouragement.  Placed in the recliner after walk. [phone]  Liliane Channel RN, BSN 10/17/2013 11:39 AM

## 2013-10-17 NOTE — Progress Notes (Addendum)
[address]       [place] [address]             [phone]      5 Days Post-Op Procedure(s) (LRB): CORONARY ARTERY BYPASS GRAFT TIMES TWO (N/A) AORTIC VALVE REPLACEMENT (AVR) (N/A) Subjective: Fairly weak, moderate pain, some sternal drainage  Objective: Vital signs in last 24 hours: Temp:  [98.5 F (36.9 C)-99 F (37.2 C)] 99 F (37.2 C) (05/16 0430) Pulse Rate:  [62-80] 62 (05/16 0430) Cardiac Rhythm:  [-] Normal sinus rhythm (05/16 0943) Resp:  [20-25] 20 (05/15 1558) BP: (119-135)/(48-52) 119/49 mmHg (05/16 0430) SpO2:  [86 %-100 %] 100 % (05/16 0430) Weight:  [181 lb 10.5 oz (82.4 kg)] 181 lb 10.5 oz (82.4 kg) (05/16 0430)  Hemodynamic parameters for last 24 hours:    Intake/Output from previous day: 05/15 0701 - 05/16 0700 In: 240 [P.O.:240] Out: 1350 [Urine:1350] Intake/Output this shift: Total I/O In: 120 [P.O.:120] Out: 650 [Urine:650]  General appearance: alert, cooperative and no distress Heart: regular rate and rhythm Lungs: mildly dim in the bases Abdomen: benign Extremities: minor edema Wound: small amt of sternal drainage, bone feels stable  Lab Results:  Recent Labs  10/15/13 0330 10/16/13 0200  WBC 14.0* 12.6*  HGB 8.9* 8.8*  HCT 27.5* 27.4*  PLT 120* 127*   BMET:  Recent Labs  10/15/13 0330 10/16/13 0200  NA 138 137  K 4.5 4.7  CL 99 96  CO2 30 32  GLUCOSE 119* 128*  BUN 19 18  CREATININE 0.71 0.68  CALCIUM 7.6* 8.0*    PT/INR: No results found for this basename: LABPROT, INR,  in the last 72 hours ABG    Component Value Date/Time   PHART 7.291* 10/13/2013 0221   HCO3 23.9 10/13/2013 0221   TCO2 24 10/13/2013 1629   ACIDBASEDEF 3.0* 10/13/2013 0221   O2SAT 97.0 10/13/2013 0221   CBG (last 3)   Recent Labs  10/16/13 2136 10/17/13 0430 10/17/13 0635  GLUCAP 106* 130* 97   Scheduled Meds: . antiseptic oral rinse  15 mL Mouth Rinse BID  . aspirin EC  325 mg Oral Daily  . docusate sodium   200 mg Oral Daily  . enoxaparin (LOVENOX) injection  40 mg Subcutaneous QHS  . FLUoxetine  40 mg Oral Daily  . furosemide  40 mg Oral BID  . insulin aspart  0-24 Units Subcutaneous TID AC & HS  . insulin detemir  20 Units Subcutaneous Daily  . levothyroxine  150 mcg Oral QAC breakfast  . metoprolol tartrate  12.5 mg Oral BID  . pantoprazole  40 mg Oral QAC breakfast  . potassium chloride  20 mEq Oral BID  . sodium chloride  3 mL Intravenous Q12H  . sulfamethoxazole-trimethoprim  1 tablet Oral Q12H  . traZODone  100 mg Oral QHS   Continuous Infusions:  PRN Meds:.sodium chloride, albuterol, bisacodyl, bisacodyl, HYDROmorphone, lactulose, ondansetron (ZOFRAN) IV, ondansetron, sodium chloride, traMADol Dg Chest 2 View  10/16/2013   CLINICAL DATA:  Shortness of breath, wheezing, post CABG (postoperative day #4)  EXAM: CHEST  2 VIEW  COMPARISON:  DG CHEST 1V PORT dated 10/15/2013; DG CHEST 1V PORT dated 10/12/2013; DG CHEST 2 VIEW dated 10/08/2013; DG CHEST 1V PORT dated 10/13/2013  FINDINGS: Unchanged enlarged cardiac silhouette and mediastinal contours post median sternotomy, CABG and aortic valve replacement. Interval removal of right jugular approach central venous catheter. Overall improved aeration of the lungs with persistent cephalization  of flow. Grossly unchanged perihilar and bibasilar heterogeneous opacities. Unchanged trace bilateral effusions. No pneumothorax. No new focal airspace opacities. Unchanged bones. Post cholecystectomy.  IMPRESSION: 1. Overall improved aeration of the lungs suggests resolving edema and atelectasis. 2. Persistent findings of pulmonary venous congestion, perihilar and bibasilar atelectasis and trace bilateral effusions.   Electronically Signed   By: Sandi Mariscal M.D.   On: 10/16/2013 07:16     Assessment/Plan: S/P Procedure(s) (LRB): CORONARY ARTERY BYPASS GRAFT TIMES TWO (N/A) AORTIC VALVE REPLACEMENT (AVR) (N/A)  1 steady progress 2 Push rehab/pulm toilet,  wean O2 3 bactrim - prob sternal fatty necrosis 4 sugars well controlled 5 rhythm stable  LOS: 9 days    [name] 10/17/2013  Still weak I have seen and examined [name] and agree with the above assessment  and plan.  Grace Isaac MD Beeper [phone] Office [phone] 10/17/2013 12:19 PM

## 2013-10-17 NOTE — Progress Notes (Signed)
Patient ambulated with nursing staff 150 feet. Will monitor patient. Bettina Gavia Koree Staheli RN

## 2013-10-17 NOTE — Progress Notes (Signed)
Pt refused to ambulate this evening although pt has ambulated to the bathroom multiple times.  Education reinforced as well as encouragement to keep working on her IS.  Will continue to monitor pt closely.  Call bell within reach.

## 2013-10-18 LAB — CBC
HCT: 30 % — ABNORMAL LOW (ref 36.0–46.0)
Hemoglobin: 9.5 g/dL — ABNORMAL LOW (ref 12.0–15.0)
MCH: 28.4 pg (ref 26.0–34.0)
MCHC: 31.7 g/dL (ref 30.0–36.0)
MCV: 89.8 fL (ref 78.0–100.0)
Platelets: 164 10*3/uL (ref 150–400)
RBC: 3.34 MIL/uL — ABNORMAL LOW (ref 3.87–5.11)
RDW: 15.1 % (ref 11.5–15.5)
WBC: 10.2 10*3/uL (ref 4.0–10.5)

## 2013-10-18 LAB — BASIC METABOLIC PANEL
BUN: 15 mg/dL (ref 6–23)
CO2: 33 mEq/L — ABNORMAL HIGH (ref 19–32)
Calcium: 8.5 mg/dL (ref 8.4–10.5)
Chloride: 93 mEq/L — ABNORMAL LOW (ref 96–112)
Creatinine, Ser: 0.84 mg/dL (ref 0.50–1.10)
GFR calc Af Amer: 80 mL/min — ABNORMAL LOW (ref >90)
GFR calc non Af Amer: 69 mL/min — ABNORMAL LOW (ref >90)
Glucose, Bld: 116 mg/dL — ABNORMAL HIGH (ref 70–99)
Potassium: 4.7 mEq/L (ref 3.7–5.3)
Sodium: 135 mEq/L — ABNORMAL LOW (ref 137–147)

## 2013-10-18 LAB — GLUCOSE, CAPILLARY
GLUCOSE-CAPILLARY: 106 mg/dL — AB (ref 70–99)
GLUCOSE-CAPILLARY: 117 mg/dL — AB (ref 70–99)
Glucose-Capillary: 185 mg/dL — ABNORMAL HIGH (ref 70–99)
Glucose-Capillary: 168 mg/dL — ABNORMAL HIGH (ref 70–99)

## 2013-10-18 NOTE — Progress Notes (Addendum)
[address]       [place], [address]             [phone]      6 Days Post-Op Procedure(s) (LRB): CORONARY ARTERY BYPASS GRAFT TIMES TWO (N/A) AORTIC VALVE REPLACEMENT (AVR) (N/A) Subjective: Feels only fair. Still pretty weak but improving  Objective: Vital signs in last 24 hours: Temp:  [98.7 F (37.1 C)-99.8 F (37.7 C)] 98.7 F (37.1 C) (05/17 0416) Pulse Rate:  [64-67] 64 (05/17 0416) Cardiac Rhythm:  [-] Heart block (05/16 1920) Resp:  [17-20] 17 (05/17 0416) BP: (111-140)/(46-59) 111/48 mmHg (05/17 0416) SpO2:  [92 %-96 %] 96 % (05/17 0416) Weight:  [176 lb (79.833 kg)] 176 lb (79.833 kg) (05/17 0416)  Hemodynamic parameters for last 24 hours:    Intake/Output from previous day: 05/16 0701 - 05/17 0700 In: 243 [P.O.:240; I.V.:3] Out: 1950 [Urine:1950] Intake/Output this shift:    General appearance: alert, cooperative, fatigued and no distress Heart: regular rate and rhythm and 2/6 aortic systolic murmur Lungs: dim in bases Abdomen: benign Extremities: no sig edema Wound: incis healing well  Lab Results:  Recent Labs  10/16/13 0200 10/18/13 0633  WBC 12.6* 10.2  HGB 8.8* 9.5*  HCT 27.4* 30.0*  PLT 127* 164   BMET:  Recent Labs  10/16/13 0200 10/18/13 0633  NA 137 135*  K 4.7 4.7  CL 96 93*  CO2 32 33*  GLUCOSE 128* 116*  BUN 18 15  CREATININE 0.68 0.84  CALCIUM 8.0* 8.5    PT/INR: No results found for this basename: LABPROT, INR,  in the last 72 hours ABG    Component Value Date/Time   PHART 7.291* 10/13/2013 0221   HCO3 23.9 10/13/2013 0221   TCO2 24 10/13/2013 1629   ACIDBASEDEF 3.0* 10/13/2013 0221   O2SAT 97.0 10/13/2013 0221   CBG (last 3)   Recent Labs  10/17/13 1904 10/17/13 2125 10/18/13 0621  GLUCAP 159* 94 106*   Scheduled Meds: . antiseptic oral rinse  15 mL Mouth Rinse BID  . aspirin EC  325 mg Oral Daily  . docusate sodium  200 mg Oral Daily  . enoxaparin (LOVENOX) injection  40 mg  Subcutaneous QHS  . FLUoxetine  40 mg Oral Daily  . furosemide  40 mg Oral BID  . insulin aspart  0-24 Units Subcutaneous TID AC & HS  . insulin detemir  20 Units Subcutaneous Daily  . levothyroxine  150 mcg Oral QAC breakfast  . metoprolol tartrate  12.5 mg Oral BID  . pantoprazole  40 mg Oral QAC breakfast  . potassium chloride  20 mEq Oral BID  . sodium chloride  3 mL Intravenous Q12H  . sulfamethoxazole-trimethoprim  1 tablet Oral Q12H  . traZODone  100 mg Oral QHS   Continuous Infusions:  PRN Meds:.sodium chloride, albuterol, bisacodyl, bisacodyl, HYDROmorphone, lactulose, ondansetron (ZOFRAN) IV, ondansetron, sodium chloride, traMADol  Dg Chest 2 View  10/16/2013   CLINICAL DATA:  Shortness of breath, wheezing, post CABG (postoperative day #4)  EXAM: CHEST  2 VIEW  COMPARISON:  DG CHEST 1V PORT dated 10/15/2013; DG CHEST 1V PORT dated 10/12/2013; DG CHEST 2 VIEW dated 10/08/2013; DG CHEST 1V PORT dated 10/13/2013  FINDINGS: Unchanged enlarged cardiac silhouette and mediastinal contours post median sternotomy, CABG and aortic valve replacement. Interval removal of right jugular approach central venous catheter. Overall improved aeration of the lungs with persistent cephalization of flow. Grossly unchanged perihilar and bibasilar heterogeneous  opacities. Unchanged trace bilateral effusions. No pneumothorax. No new focal airspace opacities. Unchanged bones. Post cholecystectomy.  IMPRESSION: 1. Overall improved aeration of the lungs suggests resolving edema and atelectasis. 2. Persistent findings of pulmonary venous congestion, perihilar and bibasilar atelectasis and trace bilateral effusions.   Electronically Signed   By: Sandi Mariscal M.D.   On: 10/16/2013 07:16   Dg Chest Port 1 View  10/15/2013   CLINICAL DATA:  Post CABG and aortic valve replacement  EXAM: PORTABLE CHEST - 1 VIEW  COMPARISON:  DG CHEST 1V PORT dated 10/14/2013; DG CHEST 2 VIEW dated 10/08/2013; CT ANGIO CHEST W/CM &/OR WO/CM dated  11/14/2012; DG CHEST 2 VIEW dated 02/12/2011  FINDINGS: Grossly unchanged enlarged cardiac silhouette and mediastinal contours post median sternotomy, CABG and aortic valve replacement. Stable position of support apparatus. No pneumothorax. The pulmonary vasculature remains indistinct with cephalization of flow. Grossly unchanged perihilar and bibasilar heterogeneous opacities. No new focal airspace opacities. No pleural effusion. Unchanged bones.  IMPRESSION: 1.  Stable positioning of support apparatus.  No pneumothorax. 2. Mild worsening of pulmonary edema and perihilar/bibasilar atelectasis.   Electronically Signed   By: Sandi Mariscal M.D.   On: 10/15/2013 07:56    Assessment/Plan: S/P Procedure(s) (LRB): CORONARY ARTERY BYPASS GRAFT TIMES TWO (N/A) AORTIC VALVE REPLACEMENT (AVR) (N/A)  1 slow but steady progress- push rehab as able 2 wean O2 3 only minor sternal drainage 4 labs/sugars stable 5 allergic to ace inhib    LOS: 10 days    [name] 10/18/2013  Wound intact, no drainage now Still limited by respiratory / chronic copd Check chest xray in am I have seen and examined Freada Bergeron and agree with the above assessment  and plan.  Grace Isaac MD Beeper [phone] Office [phone] 10/18/2013 11:47 AM

## 2013-10-19 ENCOUNTER — Inpatient Hospital Stay (HOSPITAL_COMMUNITY): Payer: Medicare Other

## 2013-10-19 LAB — CBC
HCT: 30.1 % — ABNORMAL LOW (ref 36.0–46.0)
Hemoglobin: 9.6 g/dL — ABNORMAL LOW (ref 12.0–15.0)
MCH: 28.3 pg (ref 26.0–34.0)
MCHC: 31.9 g/dL (ref 30.0–36.0)
MCV: 88.8 fL (ref 78.0–100.0)
Platelets: 160 10*3/uL (ref 150–400)
RBC: 3.39 MIL/uL — ABNORMAL LOW (ref 3.87–5.11)
RDW: 15.1 % (ref 11.5–15.5)
WBC: 11.8 10*3/uL — ABNORMAL HIGH (ref 4.0–10.5)

## 2013-10-19 LAB — BASIC METABOLIC PANEL
BUN: 15 mg/dL (ref 6–23)
CO2: 32 mEq/L (ref 19–32)
Calcium: 8.6 mg/dL (ref 8.4–10.5)
Chloride: 90 mEq/L — ABNORMAL LOW (ref 96–112)
Creatinine, Ser: 0.89 mg/dL (ref 0.50–1.10)
GFR calc Af Amer: 75 mL/min — ABNORMAL LOW (ref >90)
GFR calc non Af Amer: 65 mL/min — ABNORMAL LOW (ref >90)
Glucose, Bld: 105 mg/dL — ABNORMAL HIGH (ref 70–99)
Potassium: 4.7 mEq/L (ref 3.7–5.3)
Sodium: 134 mEq/L — ABNORMAL LOW (ref 137–147)

## 2013-10-19 LAB — GLUCOSE, CAPILLARY
GLUCOSE-CAPILLARY: 132 mg/dL — AB (ref 70–99)
Glucose-Capillary: 101 mg/dL — ABNORMAL HIGH (ref 70–99)
Glucose-Capillary: 118 mg/dL — ABNORMAL HIGH (ref 70–99)
Glucose-Capillary: 128 mg/dL — ABNORMAL HIGH (ref 70–99)
Glucose-Capillary: 131 mg/dL — ABNORMAL HIGH (ref 70–99)

## 2013-10-19 MED ORDER — ACETAMINOPHEN 325 MG PO TABS: 650.0000 mg | ORAL_TABLET | Freq: Four times a day (QID) | ORAL | Status: DC | PRN

## 2013-10-19 MED ORDER — FLEET ENEMA 7-19 GM/118ML RE ENEM
1.0000 | ENEMA | Freq: Once | RECTAL | Status: DC
  Filled 2013-10-19: qty 1

## 2013-10-19 NOTE — Progress Notes (Signed)
CARDIAC REHAB PHASE I   PRE:  Rate/Rhythm: 95 SR  BP:  Supine:   Sitting: 110/56  Standing:    SaO2: 96  21/2 L  MODE:  Ambulation: 350 ft   POST:  Rate/Rhythm: 75  BP:  Supine:   Sitting: 112/60  Standing:    SaO2: 90 2L 0920-1000 On arrival pt very anxious, c/o of not being able to breath. O2 sat on 21/2L 96%. Pt to [facility] then assisted X  1 used walker and O2 2L to ambulate. Pt able to walk 350 feet with encouragement. Pt upset that all of her medications are not the way she takes them at home and she can not understand why she is so SOB. Pt to recliner after walk with call light in reach and husband present. Pt c/o of being constipated, I encouraged her to discuss with her nurse that she has PRN medications for constipation. She has PRN breathing treatments ordered every 6 hours, I ask for her one and instructed her she could them every 6 hours. I encouraged use of IS and two more walks today. O2 sat during walk and after on 2L 90%.   Rodney Langton RN 10/19/2013 10:00 AM

## 2013-10-19 NOTE — Progress Notes (Signed)
Patient ambulated with walker and O2 2L approx 376ft down hall and around circle. Tolerated well, but fatigued after ambulation. Steady gait with walker.  Clovis Riley, RN

## 2013-10-19 NOTE — Progress Notes (Signed)
7 Days Post-Op Procedure(s) (LRB): CORONARY ARTERY BYPASS GRAFT TIMES TWO (N/A) AORTIC VALVE REPLACEMENT (AVR) (N/A) Subjective: Had some shortness of breath this am . She is not sure if it was after a particular medication. It resolved without treatment. She is ambulating. Bowels working. Sats 90% on 2L [place] during and after walk.  Objective: Vital signs in last 24 hours: Temp:  [97.6 F (36.4 C)-99 F (37.2 C)] 98.5 F (36.9 C) (05/18 0425) Pulse Rate:  [58-89] 89 (05/18 0425) Cardiac Rhythm:  [-] Heart block (05/18 0815) Resp:  [18-20] 20 (05/18 0425) BP: (108-124)/(42-56) 124/56 mmHg (05/18 0425) SpO2:  [91 %-98 %] 92 % (05/18 1003) Weight:  [78.2 kg (172 lb 6.4 oz)] 78.2 kg (172 lb 6.4 oz) (05/18 0500)  Hemodynamic parameters for last 24 hours:    Intake/Output from previous day: 05/17 0701 - 05/18 0700 In: 340 [P.O.:340] Out: 3350 [Urine:3350] Intake/Output this shift: Total I/O In: 120 [P.O.:120] Out: -   General appearance: alert and cooperative Heart: regular rate and rhythm, S1, S2 normal, no murmur, click, rub or gallop Lungs: clear to auscultation bilaterally Extremities: extremities normal, atraumatic, no cyanosis or edema Wound: minimal serous drainage at middle of chest incision.  Lab Results:  Recent Labs  10/18/13 0633 10/19/13 0403  WBC 10.2 11.8*  HGB 9.5* 9.6*  HCT 30.0* 30.1*  PLT 164 160   BMET:  Recent Labs  10/18/13 0633 10/19/13 0403  NA 135* 134*  K 4.7 4.7  CL 93* 90*  CO2 33* 32  GLUCOSE 116* 105*  BUN 15 15  CREATININE 0.84 0.89  CALCIUM 8.5 8.6    PT/INR: No results found for this basename: LABPROT, INR,  in the last 72 hours ABG    Component Value Date/Time   PHART 7.291* 10/13/2013 0221   HCO3 23.9 10/13/2013 0221   TCO2 24 10/13/2013 1629   ACIDBASEDEF 3.0* 10/13/2013 0221   O2SAT 97.0 10/13/2013 0221   CBG (last 3)   Recent Labs  10/18/13 2326 10/19/13 0608 10/19/13 1123  GLUCAP 168* 118* 132*     Assessment/Plan: S/P Procedure(s) (LRB): CORONARY ARTERY BYPASS GRAFT TIMES TWO (N/A) AORTIC VALVE REPLACEMENT (AVR) (N/A) She is hemodynamically stable Diuresed to preop wt. Will hold off on further diuresis. COPD: continue bronchodilator prn and IS Chest incision looks good. Will continue Bactrim until the drainage stops. Mobilize Diabetes control   LOS: 11 days    [name] 10/19/2013

## 2013-10-19 NOTE — Telephone Encounter (Signed)
Pt requesting medication refill. Last ov 09/2013. Medication seems to have prescribed at [facility]. pls advise

## 2013-10-20 LAB — GLUCOSE, CAPILLARY
GLUCOSE-CAPILLARY: 115 mg/dL — AB (ref 70–99)
Glucose-Capillary: 130 mg/dL — ABNORMAL HIGH (ref 70–99)
Glucose-Capillary: 123 mg/dL — ABNORMAL HIGH (ref 70–99)

## 2013-10-20 MED ORDER — INSULIN LISPRO PROT & LISPRO (75-25 MIX) 100 UNIT/ML ~~LOC~~ SUSP: 50.0000 [IU] | Freq: Two times a day (BID) | SUBCUTANEOUS | Status: DC

## 2013-10-20 MED ORDER — METOPROLOL TARTRATE 12.5 MG HALF TABLET: 12.5000 mg | ORAL_TABLET | Freq: Two times a day (BID) | ORAL | Status: DC

## 2013-10-20 MED ORDER — TRAMADOL HCL 50 MG PO TABS: 50.0000 mg | ORAL_TABLET | ORAL | Status: DC | PRN

## 2013-10-20 MED ORDER — SULFAMETHOXAZOLE-TMP DS 800-160 MG PO TABS: 1.0000 | ORAL_TABLET | Freq: Two times a day (BID) | ORAL | Status: DC

## 2013-10-20 NOTE — Progress Notes (Signed)
1420-1500 Cardiac Rehab Completed discharge education with pt and husband. They voice understanding. Pt agrees to [place]. [facility] in [place], will send referral. We discussed smoking cessation. I gave pt tips for quitting, quit smart class schedule and coaching contact number. She voice desire to quit and states that she has quit for six years but restarted due to stress. I encouraged her to be alert to these triggers.

## 2013-10-20 NOTE — Care Management Note (Signed)
    Page 1 of 1   10/20/2013     4:53:47 PM CARE MANAGEMENT NOTE 10/20/2013  Patient:  [name], [name]   Account Number:  [id number]  Date Initiated:  10/20/2013  Documentation initiated by:  Mahagony Grieb  Subjective/Objective Assessment:   Pt s/p CABG x2 and AVR; PTA, pt independent, lives with spouse.     Action/Plan:   Pt for dc home today; needs HH follow up and RW for home. Referral to [facility], per pt /husb choice.  Start of care 24-48h post dc date.  Referral to [facility] for DME needs.   Anticipated DC Date:  10/20/2013   Anticipated DC Plan:  [place]  CM consult      [facility] Choice  HOME HEALTH   Choice offered to / List presented to:  C-1 Patient   DME arranged  [place] Moselle      DME agency  [place] arranged  HH-1 RN  [place].   Status of service:  Completed, signed off Medicare Important Message given?   (If response is "NO", the following Medicare IM given date fields will be blank) Date Medicare IM given:   Date Additional Medicare IM given:  10/20/2013  Discharge Disposition:  [place]  Per UR Regulation:  Reviewed for med. necessity/level of care/duration of stay  If discussed at [place] of Stay Meetings, dates discussed:    Comments:

## 2013-10-20 NOTE — Progress Notes (Signed)
Pt ambulated 300 ft on R/A with O2 sats 90% or greater.  Tolerated Well.  Will continue to monitor.

## 2013-10-20 NOTE — Progress Notes (Signed)
1105 Cardiac Rehab Pt states that she walked with NT around 9am using walker and O2 2L. We will follow pt in this pm to ambulate without O2 and check sats. Deon Pilling, RN 10/20/2013 11:13 AM

## 2013-10-20 NOTE — Progress Notes (Addendum)
[address]       [place] [address]             [phone]      8 Days Post-Op Procedure(s) (LRB): CORONARY ARTERY BYPASS GRAFT TIMES TWO (N/A) AORTIC VALVE REPLACEMENT (AVR) (N/A) Subjective: Feels quite anxious, sats ok at rest off O2  Objective: Vital signs in last 24 hours: Temp:  [98.2 F (36.8 C)-98.9 F (37.2 C)] 98.2 F (36.8 C) (05/18 2100) Pulse Rate:  [63-89] 89 (05/18 2100) Cardiac Rhythm:  [-] Heart block (05/18 1540) Resp:  [17-18] 17 (05/18 2100) BP: (114-125)/(50-65) 125/65 mmHg (05/18 2100) SpO2:  [90 %-93 %] 93 % (05/18 2100)  Hemodynamic parameters for last 24 hours:    Intake/Output from previous day: 05/18 0701 - 05/19 0700 In: 120 [P.O.:120] Out: 800 [Urine:800] Intake/Output this shift:    General appearance: alert, cooperative, no distress and anxious Heart: regular rate and rhythm Lungs: mildly dim in the bases Abdomen: benign Extremities: no edema Wound: incis healing well  Lab Results:  Recent Labs  10/18/13 0633 10/19/13 0403  WBC 10.2 11.8*  HGB 9.5* 9.6*  HCT 30.0* 30.1*  PLT 164 160   BMET:  Recent Labs  10/18/13 0633 10/19/13 0403  NA 135* 134*  K 4.7 4.7  CL 93* 90*  CO2 33* 32  GLUCOSE 116* 105*  BUN 15 15  CREATININE 0.84 0.89  CALCIUM 8.5 8.6    PT/INR: No results found for this basename: LABPROT, INR,  in the last 72 hours ABG    Component Value Date/Time   PHART 7.291* 10/13/2013 0221   HCO3 23.9 10/13/2013 0221   TCO2 24 10/13/2013 1629   ACIDBASEDEF 3.0* 10/13/2013 0221   O2SAT 97.0 10/13/2013 0221   CBG (last 3)   Recent Labs  10/19/13 1631 10/19/13 2143 10/20/13 0558  GLUCAP 128* 131* 115*   Scheduled Meds: . antiseptic oral rinse  15 mL Mouth Rinse BID  . aspirin EC  325 mg Oral Daily  . docusate sodium  200 mg Oral Daily  . enoxaparin (LOVENOX) injection  40 mg Subcutaneous QHS  . FLUoxetine  40 mg Oral Daily  . insulin aspart  0-24 Units Subcutaneous TID AC &  HS  . insulin detemir  20 Units Subcutaneous Daily  . levothyroxine  150 mcg Oral QAC breakfast  . metoprolol tartrate  12.5 mg Oral BID  . pantoprazole  40 mg Oral QAC breakfast  . sodium chloride  3 mL Intravenous Q12H  . sodium phosphate  1 enema Rectal Once  . sulfamethoxazole-trimethoprim  1 tablet Oral Q12H  . traZODone  100 mg Oral QHS   Continuous Infusions:  PRN Meds:.sodium chloride, acetaminophen, albuterol, bisacodyl, bisacodyl, lactulose, ondansetron (ZOFRAN) IV, ondansetron, sodium chloride, traMADol  Dg Chest 2 View  10/19/2013   CLINICAL DATA:  Chest pain.  CABG.  EXAM: CHEST  2 VIEW  COMPARISON:  DG CHEST 2 VIEW dated 10/16/2013  FINDINGS: Prior CABG and aortic valve replacement. Cardiomegaly. Interim partial clearing of bilateral pulmonary edema with mild residual. Tiny bilateral pleural effusions. No pneumothorax.  IMPRESSION: 1. Prior CABG.  Prior aortic valve replacement.  Cardiomegaly. 2. Interim partial clearing of pulmonary edema with residual interstitial edema present. Small pleural effusions are noted.   Electronically Signed   By: Marcello Moores  Register   On: 10/19/2013 08:05   Assessment/Plan: S/P Procedure(s) (LRB): CORONARY ARTERY BYPASS GRAFT TIMES TWO (N/A) AORTIC VALVE REPLACEMENT (AVR) (N/A)  1 some  SOB she relates to anxiety, will see how she does with sats when ambulating 2 minimal drainage from sternal incis 3 close to discharge 4 d/c epw's    LOS: 12 days    John Giovanni 10/20/2013   Chart reviewed, patient examined, agree with above. She is anxious but doing well medically and off oxygen with sats > 90%. She wants to go home and I think that is fine this afternoon after pacing wires removed.

## 2013-10-20 NOTE — Progress Notes (Signed)
[address]       [place] [address]             [phone]              Discharge Summary  Name: [name] DOB: 11/23/1945 68 y.o. MRN: [id number]   Admission Date: 10/08/2013 Discharge Date: 10/20/2013    Admitting Diagnosis: Chest pain   Discharge Diagnosis:  Severe multi-vessel coronary artery disease Mild aortic stenosis Moderate to severe aortic insufficiency Expected postoperative blood loss anemia  Past Medical History  Diagnosis Date  . Allergic rhinitis   . Diverticulitis of colon   . GERD (gastroesophageal reflux disease)   . Chronic depression   . Hypercholesterolemia     intolerance of statins and niaspan  . Apnea, sleep     mild, intolerant of cpap  . ACE-inhibitor cough   . Cataract   . Chronic headache   . Asthma, acute   . Hearing loss   . History of blood transfusion 2013  . Coronary artery disease   . Diabetes mellitus     type 2  . Shortness of breath   . Anxiety      Procedures: CORONARY ARTERY BYPASS GRAFTING x 2 (Left internal mammary artery to left anterior descending, saphenous vein graft to right coronary artery) ENDOSCOPIC VEIN HARVEST RIGHT THIGH AORTIC VALVE REPLACEMENT (21 mm [facility] Ease pericardial tissue valve)  - 10/12/2013   HPI:  The patient is a 68 y.o. female a history of hyperlipidemia, diabetes, ongoing smoking and COPD, and known coronary artery disease, status post drug eluting stents to RCA and mid left circumflex in May 2009. She reports episodic chest pain for the past month that has been worsening and occuring both at rest and with exertion, as well as awakening her from sleep. She has been using sublingual nitroglycerin spray with relief. Last week, she had a pharmacologic myoview at [facility] that was normal. On the day prior to admission, she developed severe pain after eating, not relieved with nitroglycerin.  She presented to the ER at [facility] for evaluation.  Troponin  was negative x 2. She was seen by cardiology and was admitted for further workup.    Hospital Course:  The patient was admitted to [facility] on 10/08/2013. She was started on a nitroglycerin drip and pain resolved.  Cardiac catheterization was performed on 5/8 and showed severe multi-vessel coronary disease. The LAD had 95% stenosis proximally. The LCX stent was patent with diffuse 30% restenosis. The RCA has a 90% ostial stenosis with a patent proximal stent and 50% mid vessel stenosis. She had recurrent chest pain twice which she says was related to her nitroglycerin pump stopping. She also had some pain at one point while on nitroglycerin.  She has a history of mild AS and moderate AI seen on echo on 09/30/12. The mean gradient was 12 mm Hg and peak 24. AVA was 2.28cm2 by VTI and 1.91 by Vmax. A recent echo at [facility] on 10/03/13 showed an increase in the mean gradient to 17 and an increase in the peak to 28 with an AVA of 1.5 cm2. There is still moderate AI with a slight decrease in the LVEF to 50-55% compared to last year when the LVEF was 55-60%. There was no gradient measured at cath.  A cardiac surgery consult was requested and Dr. Cyndia Bent saw the patient.  He felt that CABG was indicated at this time for relief  of symptoms.  It was also felt that TEE should be performed intraoperatively to evaluate the aortic valve for consideration of possible replacement.  All risks, benefits and alternatives of surgery were explained in detail, and the patient agreed to proceed.   The patient was taken to the operating room and underwent the above procedure.  Intraoperative TEE revealed mild aortic stenosis with moderate to severe aortic insufficiency. The AI was right at the borderline of severe. There was fluttering of the anterior mitral leaflet from the jet. There was LV dilatation with an internal diameter greater than 5 cm. LV function was good. There was no MR. It was felt that the valve should be replaced.    The postoperative course was generally uneventful.  She was started on diuretics for a mild volume overload and is diuresing well.  She initially had some bradycardia, but this resolved and she has remained in sinus rhythm since then. Her creatinine also bumped up early in the course, but trended back down to baseline. She was noted to have mild pulmonary edema on chest x-ray and shortness of breath with increased oxygen requirements.  This has been treated with bronchodilators and aggressive pulmonary toilet measures. She presently has been weaned off all supplemental oxygen. The patient also developed some serous drainage from the sternal wound, but no erythema or purulence.  She was started on empiric antibiotics and this is improving.  Overall, she is making slow and steady progress.  Incisions are healing well.  She remains in sinus rhythm and blood pressures are controlled.  Creatinine has returned to baseline. She is diuresing well and is back to her preop weight. She is tolerating a regular diet and ambulating in the hall without difficulty. Blood sugars are stable on home medications.  The patient is presently medically stable for discharge home.     Recent vital signs:  Filed Vitals:   10/19/13 2100  BP: 125/65  Pulse: 89  Temp: 98.2 F (36.8 C)  Resp: 17    Recent laboratory studies:  CBC: Recent Labs  10/18/13 0633 10/19/13 0403  WBC 10.2 11.8*  HGB 9.5* 9.6*  HCT 30.0* 30.1*  PLT 164 160   BMET:  Recent Labs  10/18/13 0633 10/19/13 0403  NA 135* 134*  K 4.7 4.7  CL 93* 90*  CO2 33* 32  GLUCOSE 116* 105*  BUN 15 15  CREATININE 0.84 0.89  CALCIUM 8.5 8.6    PT/INR: No results found for this basename: LABPROT, INR,  in the last 72 hours   Discharge Medications:     Medication List    STOP taking these medications       isosorbide mononitrate 30 MG 24 hr tablet  Commonly known as:  IMDUR      TAKE these medications       albuterol 108 (90 BASE)  MCG/ACT inhaler  Commonly known as:  PROVENTIL HFA;VENTOLIN HFA  Inhale 2 puffs into the lungs every 6 (six) hours as needed for wheezing or shortness of breath. As needed for wheezing     aspirin EC 325 MG tablet  Take 1 tablet (325 mg total) by mouth daily.     CALTRATE 600 PLUS-VIT D PO  Take 2 tablets by mouth 2 (two) times daily.     FLUoxetine 40 MG capsule  Commonly known as:  PROZAC  Take 40 mg by mouth 2 (two) times daily.     insulin lispro protamine-lispro (75-25) 100 UNIT/ML Susp injection  Commonly known  as:  HUMALOG 75/25 MIX  Inject 50 Units into the skin 2 (two) times daily with a meal. Note: start with half your regular dose until you are eating better, then increase to your regular home dose     lansoprazole 30 MG capsule  Commonly known as:  PREVACID  Take one by mouth before meal twice daily     levothyroxine 150 MCG tablet  Commonly known as:  SYNTHROID, LEVOTHROID  Take 150 mcg by mouth daily.     LORazepam 0.5 MG tablet  Commonly known as:  ATIVAN  Take 0.5 mg by mouth 2 (two) times daily.     metoprolol tartrate 12.5 mg Tabs tablet  Commonly known as:  LOPRESSOR  Take 0.5 tablets (12.5 mg total) by mouth 2 (two) times daily.     nitroGLYCERIN 0.4 MG/SPRAY spray  Commonly known as:  NITROLINGUAL  Place 1 spray under the tongue as needed. Chest pain     sulfamethoxazole-trimethoprim 800-160 MG per tablet  Commonly known as:  BACTRIM DS  Take 1 tablet by mouth every 12 (twelve) hours. X 7 days     traMADol 50 MG tablet  Commonly known as:  ULTRAM  Take 1 tablet (50 mg total) by mouth every 4 (four) hours as needed for moderate pain.     traZODone 100 MG tablet  Commonly known as:  DESYREL  Take 200 mg by mouth at bedtime.     traZODone 100 MG tablet  Commonly known as:  DESYREL  TAKE 2 TABLETS (200 MG TOTAL) AT BEDTIME         Discharge Instructions:  The patient is to refrain from driving, heavy lifting or strenuous activity.  May shower  daily and clean incisions with soap and water.  May resume regular diet.   Follow Up: Follow-up Information   Follow up with [facility], MD In 2 weeks. (Office will contact you with an appointment)    Specialty:  Cardiology   Contact information:   [place] [place] [address] [phone]       Follow up with [name], MD On 11/25/2013. (Have a chest x-ray at [place] at  11:00 , then see MD at 12:00)    Specialty:  Cardiothoracic Surgery   Contact information:   [address] [place] [address] [phone]      The patient has been discharged on:  1.Beta Blocker: Yes [ x ]  No [ ]   If No, reason:    2.Ace Inhibitor/ARB: Yes [ ]   No [ x ]  If No, reason: allergy/intolerance   3.Statin: Yes [  ]  No [ x ]  If No, reason: allergy/intolerance   4.[place]: Yes [ x ]  No [ ]   If No, reason:       Coolidge Breeze 10/20/2013, 12:17 PM

## 2013-10-21 ENCOUNTER — Other Ambulatory Visit: Payer: Self-pay

## 2013-10-21 DIAGNOSIS — Z794 Long term (current) use of insulin: Secondary | ICD-10-CM | POA: Diagnosis not present

## 2013-10-21 DIAGNOSIS — F329 Major depressive disorder, single episode, unspecified: Secondary | ICD-10-CM | POA: Diagnosis not present

## 2013-10-21 DIAGNOSIS — J449 Chronic obstructive pulmonary disease, unspecified: Secondary | ICD-10-CM | POA: Diagnosis not present

## 2013-10-21 DIAGNOSIS — F17200 Nicotine dependence, unspecified, uncomplicated: Secondary | ICD-10-CM | POA: Diagnosis not present

## 2013-10-21 DIAGNOSIS — F3289 Other specified depressive episodes: Secondary | ICD-10-CM | POA: Diagnosis not present

## 2013-10-21 DIAGNOSIS — E119 Type 2 diabetes mellitus without complications: Secondary | ICD-10-CM | POA: Diagnosis not present

## 2013-10-21 DIAGNOSIS — G8918 Other acute postprocedural pain: Secondary | ICD-10-CM

## 2013-10-21 DIAGNOSIS — J45909 Unspecified asthma, uncomplicated: Secondary | ICD-10-CM | POA: Diagnosis not present

## 2013-10-21 DIAGNOSIS — E785 Hyperlipidemia, unspecified: Secondary | ICD-10-CM | POA: Diagnosis not present

## 2013-10-21 DIAGNOSIS — Z48812 Encounter for surgical aftercare following surgery on the circulatory system: Secondary | ICD-10-CM | POA: Diagnosis not present

## 2013-10-21 DIAGNOSIS — I2510 Atherosclerotic heart disease of native coronary artery without angina pectoris: Secondary | ICD-10-CM | POA: Diagnosis not present

## 2013-10-21 MED ORDER — TRAMADOL HCL 50 MG PO TABS: 50.0000 mg | ORAL_TABLET | ORAL | Status: DC | PRN

## 2013-10-21 NOTE — Telephone Encounter (Signed)
RX refill faxed to pt's pharm, for Tramadol 50-100 mg po every 6 hours prn pain

## 2013-10-22 ENCOUNTER — Telehealth: Payer: Self-pay

## 2013-10-22 MED ORDER — ALBUTEROL SULFATE HFA 108 (90 BASE) MCG/ACT IN AERS: 2.0000 | INHALATION_SPRAY | Freq: Four times a day (QID) | RESPIRATORY_TRACT | Status: DC | PRN

## 2013-10-22 NOTE — Telephone Encounter (Signed)
Glad she is home. eRx sent.

## 2013-10-22 NOTE — Telephone Encounter (Signed)
[name] said pt had open heart surgery 10/12/13; pt came home on 10/20/13 and while in hospital pt was getting breathing treatments and pt was instructed to continue the breathing treatments at home if needed. At times it is hard for pt to breathe and neb treatment helps. Pt is out of neb solution; on historical med list Duoneb is listed but pt's husband said pt had been using plain albuterol neb solution. Please advise. [employer].

## 2013-10-23 ENCOUNTER — Telehealth: Payer: Self-pay | Admitting: *Deleted

## 2013-10-23 ENCOUNTER — Other Ambulatory Visit: Payer: Self-pay | Admitting: *Deleted

## 2013-10-23 DIAGNOSIS — I2510 Atherosclerotic heart disease of native coronary artery without angina pectoris: Secondary | ICD-10-CM | POA: Diagnosis not present

## 2013-10-23 DIAGNOSIS — F3289 Other specified depressive episodes: Secondary | ICD-10-CM | POA: Diagnosis not present

## 2013-10-23 DIAGNOSIS — Z48812 Encounter for surgical aftercare following surgery on the circulatory system: Secondary | ICD-10-CM | POA: Diagnosis not present

## 2013-10-23 DIAGNOSIS — J449 Chronic obstructive pulmonary disease, unspecified: Secondary | ICD-10-CM | POA: Diagnosis not present

## 2013-10-23 DIAGNOSIS — R110 Nausea: Secondary | ICD-10-CM

## 2013-10-23 DIAGNOSIS — G8918 Other acute postprocedural pain: Secondary | ICD-10-CM

## 2013-10-23 DIAGNOSIS — F329 Major depressive disorder, single episode, unspecified: Secondary | ICD-10-CM | POA: Diagnosis not present

## 2013-10-23 DIAGNOSIS — J45909 Unspecified asthma, uncomplicated: Secondary | ICD-10-CM | POA: Diagnosis not present

## 2013-10-23 DIAGNOSIS — E119 Type 2 diabetes mellitus without complications: Secondary | ICD-10-CM | POA: Diagnosis not present

## 2013-10-23 DIAGNOSIS — R600 Localized edema: Secondary | ICD-10-CM

## 2013-10-23 MED ORDER — PROMETHAZINE HCL 12.5 MG PO TABS: 12.5000 mg | ORAL_TABLET | Freq: Four times a day (QID) | ORAL | Status: DC | PRN

## 2013-10-23 MED ORDER — POTASSIUM CHLORIDE CRYS ER 10 MEQ PO TBCR: 20.0000 meq | EXTENDED_RELEASE_TABLET | Freq: Every day | ORAL | Status: DC

## 2013-10-23 MED ORDER — FUROSEMIDE 40 MG PO TABS: 40.0000 mg | ORAL_TABLET | Freq: Every day | ORAL | Status: DC

## 2013-10-23 MED ORDER — HYDROMORPHONE HCL 2 MG PO TABS: 1.0000 mg | ORAL_TABLET | ORAL | Status: DC | PRN

## 2013-10-23 NOTE — Telephone Encounter (Signed)
[name] is s/p CABG and calls with c/o not getting relief of pain with Tramadol, having episodes of nausea in the hospital and at home since discharge and a 5# weight gain.  She was given Dilaudid in the hospital with good pain control and medicated for nausea.  She says she has swelling in her legs at present. Dr. Roxy Manns has signed a Dilaudid script. Lasix, K and Phenergan have been e-prescribed.  She will be seen for follow up as scheduled.

## 2013-10-27 DIAGNOSIS — J449 Chronic obstructive pulmonary disease, unspecified: Secondary | ICD-10-CM | POA: Diagnosis not present

## 2013-10-27 DIAGNOSIS — I2510 Atherosclerotic heart disease of native coronary artery without angina pectoris: Secondary | ICD-10-CM | POA: Diagnosis not present

## 2013-10-27 DIAGNOSIS — J45909 Unspecified asthma, uncomplicated: Secondary | ICD-10-CM | POA: Diagnosis not present

## 2013-10-27 DIAGNOSIS — F3289 Other specified depressive episodes: Secondary | ICD-10-CM | POA: Diagnosis not present

## 2013-10-27 DIAGNOSIS — Z48812 Encounter for surgical aftercare following surgery on the circulatory system: Secondary | ICD-10-CM | POA: Diagnosis not present

## 2013-10-27 DIAGNOSIS — E119 Type 2 diabetes mellitus without complications: Secondary | ICD-10-CM | POA: Diagnosis not present

## 2013-10-27 DIAGNOSIS — F329 Major depressive disorder, single episode, unspecified: Secondary | ICD-10-CM | POA: Diagnosis not present

## 2013-10-29 ENCOUNTER — Other Ambulatory Visit: Payer: Self-pay | Admitting: *Deleted

## 2013-10-29 DIAGNOSIS — J45909 Unspecified asthma, uncomplicated: Secondary | ICD-10-CM | POA: Diagnosis not present

## 2013-10-29 DIAGNOSIS — G8918 Other acute postprocedural pain: Secondary | ICD-10-CM

## 2013-10-29 DIAGNOSIS — F329 Major depressive disorder, single episode, unspecified: Secondary | ICD-10-CM | POA: Diagnosis not present

## 2013-10-29 DIAGNOSIS — I2510 Atherosclerotic heart disease of native coronary artery without angina pectoris: Secondary | ICD-10-CM | POA: Diagnosis not present

## 2013-10-29 DIAGNOSIS — F3289 Other specified depressive episodes: Secondary | ICD-10-CM | POA: Diagnosis not present

## 2013-10-29 DIAGNOSIS — Z48812 Encounter for surgical aftercare following surgery on the circulatory system: Secondary | ICD-10-CM | POA: Diagnosis not present

## 2013-10-29 DIAGNOSIS — E119 Type 2 diabetes mellitus without complications: Secondary | ICD-10-CM | POA: Diagnosis not present

## 2013-10-29 DIAGNOSIS — J449 Chronic obstructive pulmonary disease, unspecified: Secondary | ICD-10-CM | POA: Diagnosis not present

## 2013-10-29 MED ORDER — HYDROMORPHONE HCL 2 MG PO TABS: 2.0000 mg | ORAL_TABLET | Freq: Four times a day (QID) | ORAL | Status: DC | PRN

## 2013-11-02 DIAGNOSIS — I2510 Atherosclerotic heart disease of native coronary artery without angina pectoris: Secondary | ICD-10-CM | POA: Diagnosis not present

## 2013-11-02 DIAGNOSIS — E119 Type 2 diabetes mellitus without complications: Secondary | ICD-10-CM | POA: Diagnosis not present

## 2013-11-02 DIAGNOSIS — Z48812 Encounter for surgical aftercare following surgery on the circulatory system: Secondary | ICD-10-CM | POA: Diagnosis not present

## 2013-11-02 DIAGNOSIS — J449 Chronic obstructive pulmonary disease, unspecified: Secondary | ICD-10-CM | POA: Diagnosis not present

## 2013-11-02 DIAGNOSIS — J45909 Unspecified asthma, uncomplicated: Secondary | ICD-10-CM | POA: Diagnosis not present

## 2013-11-02 DIAGNOSIS — F3289 Other specified depressive episodes: Secondary | ICD-10-CM | POA: Diagnosis not present

## 2013-11-02 DIAGNOSIS — F329 Major depressive disorder, single episode, unspecified: Secondary | ICD-10-CM | POA: Diagnosis not present

## 2013-11-04 DIAGNOSIS — I2510 Atherosclerotic heart disease of native coronary artery without angina pectoris: Secondary | ICD-10-CM | POA: Diagnosis not present

## 2013-11-04 DIAGNOSIS — Z48812 Encounter for surgical aftercare following surgery on the circulatory system: Secondary | ICD-10-CM | POA: Diagnosis not present

## 2013-11-04 DIAGNOSIS — F329 Major depressive disorder, single episode, unspecified: Secondary | ICD-10-CM | POA: Diagnosis not present

## 2013-11-04 DIAGNOSIS — F3289 Other specified depressive episodes: Secondary | ICD-10-CM | POA: Diagnosis not present

## 2013-11-04 DIAGNOSIS — J45909 Unspecified asthma, uncomplicated: Secondary | ICD-10-CM | POA: Diagnosis not present

## 2013-11-04 DIAGNOSIS — J449 Chronic obstructive pulmonary disease, unspecified: Secondary | ICD-10-CM | POA: Diagnosis not present

## 2013-11-04 DIAGNOSIS — E119 Type 2 diabetes mellitus without complications: Secondary | ICD-10-CM | POA: Diagnosis not present

## 2013-11-05 DIAGNOSIS — F3289 Other specified depressive episodes: Secondary | ICD-10-CM | POA: Diagnosis not present

## 2013-11-05 DIAGNOSIS — E119 Type 2 diabetes mellitus without complications: Secondary | ICD-10-CM | POA: Diagnosis not present

## 2013-11-05 DIAGNOSIS — Z48812 Encounter for surgical aftercare following surgery on the circulatory system: Secondary | ICD-10-CM | POA: Diagnosis not present

## 2013-11-05 DIAGNOSIS — J45909 Unspecified asthma, uncomplicated: Secondary | ICD-10-CM | POA: Diagnosis not present

## 2013-11-05 DIAGNOSIS — J449 Chronic obstructive pulmonary disease, unspecified: Secondary | ICD-10-CM | POA: Diagnosis not present

## 2013-11-05 DIAGNOSIS — I2510 Atherosclerotic heart disease of native coronary artery without angina pectoris: Secondary | ICD-10-CM | POA: Diagnosis not present

## 2013-11-05 DIAGNOSIS — F329 Major depressive disorder, single episode, unspecified: Secondary | ICD-10-CM | POA: Diagnosis not present

## 2013-11-09 ENCOUNTER — Emergency Department (HOSPITAL_COMMUNITY)
Admission: EM | Admit: 2013-11-09 | Discharge: 2013-11-09 | Disposition: A | Discharge status: Home / Self Care | Payer: Medicare Other | Attending: Emergency Medicine | Admitting: Emergency Medicine

## 2013-11-09 ENCOUNTER — Emergency Department (HOSPITAL_COMMUNITY): Payer: Medicare Other

## 2013-11-09 ENCOUNTER — Encounter (HOSPITAL_COMMUNITY): Payer: Self-pay | Admitting: Emergency Medicine

## 2013-11-09 DIAGNOSIS — Z954 Presence of other heart-valve replacement: Secondary | ICD-10-CM | POA: Diagnosis not present

## 2013-11-09 DIAGNOSIS — F329 Major depressive disorder, single episode, unspecified: Secondary | ICD-10-CM | POA: Diagnosis not present

## 2013-11-09 DIAGNOSIS — R002 Palpitations: Secondary | ICD-10-CM | POA: Diagnosis not present

## 2013-11-09 DIAGNOSIS — H9190 Unspecified hearing loss, unspecified ear: Secondary | ICD-10-CM | POA: Insufficient documentation

## 2013-11-09 DIAGNOSIS — Z79899 Other long term (current) drug therapy: Secondary | ICD-10-CM | POA: Insufficient documentation

## 2013-11-09 DIAGNOSIS — Z794 Long term (current) use of insulin: Secondary | ICD-10-CM | POA: Diagnosis not present

## 2013-11-09 DIAGNOSIS — F3289 Other specified depressive episodes: Secondary | ICD-10-CM | POA: Diagnosis not present

## 2013-11-09 DIAGNOSIS — Z7982 Long term (current) use of aspirin: Secondary | ICD-10-CM | POA: Insufficient documentation

## 2013-11-09 DIAGNOSIS — Z9861 Coronary angioplasty status: Secondary | ICD-10-CM | POA: Diagnosis not present

## 2013-11-09 DIAGNOSIS — F411 Generalized anxiety disorder: Secondary | ICD-10-CM | POA: Insufficient documentation

## 2013-11-09 DIAGNOSIS — E119 Type 2 diabetes mellitus without complications: Secondary | ICD-10-CM | POA: Insufficient documentation

## 2013-11-09 DIAGNOSIS — G8929 Other chronic pain: Secondary | ICD-10-CM | POA: Diagnosis not present

## 2013-11-09 DIAGNOSIS — I2510 Atherosclerotic heart disease of native coronary artery without angina pectoris: Secondary | ICD-10-CM | POA: Insufficient documentation

## 2013-11-09 DIAGNOSIS — Z951 Presence of aortocoronary bypass graft: Secondary | ICD-10-CM | POA: Insufficient documentation

## 2013-11-09 DIAGNOSIS — Z91040 Latex allergy status: Secondary | ICD-10-CM | POA: Insufficient documentation

## 2013-11-09 DIAGNOSIS — Z9889 Other specified postprocedural states: Secondary | ICD-10-CM | POA: Diagnosis not present

## 2013-11-09 DIAGNOSIS — R079 Chest pain, unspecified: Secondary | ICD-10-CM | POA: Diagnosis not present

## 2013-11-09 DIAGNOSIS — K219 Gastro-esophageal reflux disease without esophagitis: Secondary | ICD-10-CM | POA: Insufficient documentation

## 2013-11-09 DIAGNOSIS — Z792 Long term (current) use of antibiotics: Secondary | ICD-10-CM | POA: Insufficient documentation

## 2013-11-09 DIAGNOSIS — G4730 Sleep apnea, unspecified: Secondary | ICD-10-CM | POA: Insufficient documentation

## 2013-11-09 DIAGNOSIS — J45909 Unspecified asthma, uncomplicated: Secondary | ICD-10-CM | POA: Diagnosis not present

## 2013-11-09 DIAGNOSIS — F17200 Nicotine dependence, unspecified, uncomplicated: Secondary | ICD-10-CM | POA: Diagnosis not present

## 2013-11-09 LAB — CBC WITH DIFFERENTIAL/PLATELET
BASOS ABS: 0 10*3/uL (ref 0.0–0.1)
BASOS PCT: 0 % (ref 0–1)
EOS PCT: 4 % (ref 0–5)
Eosinophils Absolute: 0.4 10*3/uL (ref 0.0–0.7)
HEMATOCRIT: 35.5 % — AB (ref 36.0–46.0)
HEMOGLOBIN: 11.3 g/dL — AB (ref 12.0–15.0)
Lymphocytes Relative: 18 % (ref 12–46)
Lymphs Abs: 1.7 10*3/uL (ref 0.7–4.0)
MCH: 27.1 pg (ref 26.0–34.0)
MCHC: 31.8 g/dL (ref 30.0–36.0)
MCV: 85.1 fL (ref 78.0–100.0)
MONO ABS: 0.8 10*3/uL (ref 0.1–1.0)
MONOS PCT: 8 % (ref 3–12)
Neutro Abs: 6.5 10*3/uL (ref 1.7–7.7)
Neutrophils Relative %: 70 % (ref 43–77)
Platelets: 183 10*3/uL (ref 150–400)
RBC: 4.17 MIL/uL (ref 3.87–5.11)
RDW: 14.7 % (ref 11.5–15.5)
WBC: 9.5 10*3/uL (ref 4.0–10.5)

## 2013-11-09 LAB — BASIC METABOLIC PANEL
BUN: 12 mg/dL (ref 6–23)
CALCIUM: 8.6 mg/dL (ref 8.4–10.5)
CHLORIDE: 102 meq/L (ref 96–112)
CO2: 22 mEq/L (ref 19–32)
Creatinine, Ser: 0.88 mg/dL (ref 0.50–1.10)
GFR calc non Af Amer: 66 mL/min — ABNORMAL LOW (ref >90)
GFR, EST AFRICAN AMERICAN: 76 mL/min — AB (ref >90)
Glucose, Bld: 161 mg/dL — ABNORMAL HIGH (ref 70–99)
Potassium: 4.1 mEq/L (ref 3.7–5.3)
Sodium: 139 mEq/L (ref 137–147)

## 2013-11-09 LAB — I-STAT TROPONIN, ED: TROPONIN I, POC: 0 ng/mL (ref 0.00–0.08)

## 2013-11-09 MED ORDER — METOPROLOL TARTRATE 12.5 MG HALF TABLET: 12.5000 mg | ORAL_TABLET | Freq: Two times a day (BID) | ORAL | Status: DC

## 2013-11-09 NOTE — Discharge Instructions (Signed)
Palpitations   A palpitation is the feeling that your heartbeat is irregular or is faster than normal. It may feel like your heart is fluttering or skipping a beat. Palpitations are usually not a serious problem. However, in some cases, you may need further medical evaluation.  CAUSES   Palpitations can be caused by:   Smoking.   Caffeine or other stimulants, such as diet pills or energy drinks.   Alcohol.   Stress and anxiety.   Strenuous physical activity.   Fatigue.   Certain medicines.   Heart disease, especially if you have a history of arrhythmias. This includes atrial fibrillation, atrial flutter, or supraventricular tachycardia.   An improperly working pacemaker or defibrillator.  DIAGNOSIS   To find the cause of your palpitations, your caregiver will take your history and perform a physical exam. Tests may also be done, including:   Electrocardiography (ECG). This test records the heart's electrical activity.   Cardiac monitoring. This allows your caregiver to monitor your heart rate and rhythm in real time.   Holter monitor. This is a portable device that records your heartbeat and can help diagnose heart arrhythmias. It allows your caregiver to track your heart activity for several days, if needed.   Stress tests by exercise or by giving medicine that makes the heart beat faster.  TREATMENT   Treatment of palpitations depends on the cause of your symptoms and can vary greatly. Most cases of palpitations do not require any treatment other than time, relaxation, and monitoring your symptoms. Other causes, such as atrial fibrillation, atrial flutter, or supraventricular tachycardia, usually require further treatment.  HOME CARE INSTRUCTIONS    Avoid:   Caffeinated coffee, tea, soft drinks, diet pills, and energy drinks.   Chocolate.   Alcohol.   Stop smoking if you smoke.   Reduce your stress and anxiety. Things that can help you relax include:   A method that measures bodily functions so  you can learn to control them (biofeedback).   Yoga.   Meditation.   Physical activity such as swimming, jogging, or walking.   Get plenty of rest and sleep.  SEEK MEDICAL CARE IF:    You continue to have a fast or irregular heartbeat beyond 24 hours.   Your palpitations occur more often.  SEEK IMMEDIATE MEDICAL CARE IF:   You develop chest pain or shortness of breath.   You have a severe headache.   You feel dizzy, or you faint.  MAKE SURE YOU:   Understand these instructions.   Will watch your condition.   Will get help right away if you are not doing well or get worse.  Document Released: 05/18/2000 Document Revised: 09/15/2012 Document Reviewed: 07/20/2011  [employer] Patient Information 2014 [employer].

## 2013-11-09 NOTE — ED Provider Notes (Signed)
CSN: [id number]     Arrival date & time 11/09/13  0740 History   First MD Initiated Contact with Patient 11/09/13 [phone]     Chief Complaint  Patient presents with  . Palpitations  . Chest Pain    HPI Patient is status post CABG on 10/12/2013.  She presents with palpitations this morning without significant associated shortness of breath.  No diaphoresis.  No postoperative atrial fibrillation for the patient.  She's never had atrial fibrillation.  She states she felt a fluttering sensation in her chest it lasted for several hours and now is resolved.  She was discharged home on metoprolol but is currently not taking it and never had it filled because she says in general she does not tolerate beta blockers.  Patient denies fevers and chills.  She's had no productive cough.  She denies abdominal pain.  No urinary complaints.  No back pain.  No active chest pain at this time.  No near-syncope or syncope.   Past Medical History  Diagnosis Date  . Allergic rhinitis   . Diverticulitis of colon   . GERD (gastroesophageal reflux disease)   . Chronic depression   . Hypercholesterolemia     intolerance of statins and niaspan  . Apnea, sleep     mild, intolerant of cpap  . ACE-inhibitor cough   . Cataract   . Chronic headache   . Asthma, acute   . Hearing loss   . History of blood transfusion 2013  . Coronary artery disease   . Diabetes mellitus     type 2  . Shortness of breath   . Anxiety    Past Surgical History  Procedure Laterality Date  . Cholecystectomy  2001  . Colectomy      lap sigmoid  . Tonsillectomy    . Bartholin gland cyst excision    . Tubal ligation    . Bladder suspension    . Breast cyst excision  1988    bilateral nonmalignant tumors, x3  . Vaginal delivery      3  . Thyroidectomy    . Abdominal hysterectomy w/ partial vaginactomy    . Appendectomy  1964  . Colonoscopy  2014    polyps found, 2 clamped off.  . Cardiac catheterization    . Coronary angioplasty   10/29/2007    Prox RCA & Mid Cx.  . Coronary artery bypass graft N/A 10/12/2013    Procedure: CORONARY ARTERY BYPASS GRAFT TIMES TWO;  Surgeon: [name], MD;  Location: [facility];  Service: Open Heart Surgery;  Laterality: N/A;  . Aortic valve replacement N/A 10/12/2013    Procedure: AORTIC VALVE REPLACEMENT (AVR);  Surgeon: [name], MD;  Location: [facility];  Service: Open Heart Surgery;  Laterality: N/A;   Family History  Problem Relation Age of Onset  . Breast cancer Mother     breast  . Hypertension Father   . Cancer Father     mesothelioma  . Asthma Father   . Stroke Paternal Grandfather   . Heart disease     History  Substance Use Topics  . Smoking status: Current Every Day Smoker -- 0.50 packs/day for 30 years    Types: Cigarettes    Last Attempt to Quit: 02/03/2008  . Smokeless tobacco: Never Used  . Alcohol Use: No   OB History   Grav Para Term Preterm Abortions TAB SAB Ect Mult Living   3 3        3  Obstetric Comments   1st Menstrual Cycle: 10 1st Pregnancy: 19     Review of Systems  All other systems reviewed and are negative.     Allergies  Benadryl; Voltaren; Budesonide-formoterol fumarate; Amitriptyline; Ativan; Atorvastatin; Bupropion hcl; Codeine sulfate; Demerol; Gabapentin; Lisinopril; Meperidine hcl; Metformin; Mirtazapine; Mometasone furoate; Morphine sulfate; Olanzapine; Other; Oxycodone-acetaminophen; Propoxyphene n-acetaminophen; Rosuvastatin; Shellfish allergy; Venlafaxine; Zolpidem tartrate; and Latex  Home Medications   Prior to Admission medications   Medication Sig Start Date End Date Taking? Authorizing Provider  albuterol (PROVENTIL HFA;VENTOLIN HFA) 108 (90 BASE) MCG/ACT inhaler Inhale 2 puffs into the lungs every 6 (six) hours as needed for wheezing or shortness of breath. As needed for wheezing 10/22/13   Lucille Passy, MD  aspirin EC 325 MG tablet Take 1 tablet (325 mg total) by mouth daily. 10/08/13   Minna Merritts, MD   Calcium-Vitamin D (CALTRATE 600 PLUS-VIT D PO) Take 2 tablets by mouth 2 (two) times daily.     Historical Provider, MD  FLUoxetine (PROZAC) 40 MG capsule Take 40 mg by mouth 2 (two) times daily.    Historical Provider, MD  HYDROmorphone (DILAUDID) 2 MG tablet Take 1 tablet (2 mg total) by mouth every 6 (six) hours as needed for severe pain. 10/29/13   Grace Isaac, MD  insulin lispro protamine-lispro (HUMALOG 75/25 MIX) (75-25) 100 UNIT/ML SUSP injection Inject 50 Units into the skin 2 (two) times daily with a meal. Note: start with half your regular dose until you are eating better, then increase to your regular home dose 10/20/13   Coolidge Breeze, PA-C  lansoprazole (PREVACID) 30 MG capsule Take one by mouth before meal twice daily 12/18/12   Elsie Stain, MD  levothyroxine (SYNTHROID, LEVOTHROID) 150 MCG tablet Take 150 mcg by mouth daily.      Historical Provider, MD  LORazepam (ATIVAN) 0.5 MG tablet Take 0.5 mg by mouth 2 (two) times daily.     Historical Provider, MD  nitroGLYCERIN (NITROLINGUAL) 0.4 MG/SPRAY spray Place 1 spray under the tongue as needed. Chest pain 08/22/12   Minna Merritts, MD  potassium chloride (K-DUR,KLOR-CON) 10 MEQ tablet Take 2 tablets (20 mEq total) by mouth daily. Take 2 tablets daily x 7 days 10/23/13   Rexene Alberts, MD  promethazine (PHENERGAN) 12.5 MG tablet Take 1 tablet (12.5 mg total) by mouth every 6 (six) hours as needed for nausea or vomiting (1 or 2 tablets every 6 hours for nausea). 10/23/13   Rexene Alberts, MD  sulfamethoxazole-trimethoprim (BACTRIM DS) 800-160 MG per tablet Take 1 tablet by mouth every 12 (twelve) hours. X 7 days 10/20/13   Coolidge Breeze, PA-C  traMADol (ULTRAM) 50 MG tablet Take 1 tablet (50 mg total) by mouth every 4 (four) hours as needed for moderate pain. 10/21/13   [name], MD  traZODone (DESYREL) 100 MG tablet Take 200 mg by mouth at bedtime.    Historical Provider, MD  traZODone (DESYREL) 100 MG tablet TAKE 2  TABLETS (200 MG TOTAL) AT BEDTIME    Lucille Passy, MD   BP 123/60  Pulse 83  Resp 17  SpO2 95% Physical Exam  Nursing note and vitals reviewed. Constitutional: She is oriented to person, place, and time. She appears well-developed and well-nourished. No distress.  HENT:  Head: Normocephalic and atraumatic.  Eyes: EOM are normal.  Neck: Normal range of motion.  Cardiovascular: Normal rate, regular rhythm and normal heart sounds.   Pulmonary/Chest: Effort normal  and breath sounds normal.  Abdominal: Soft. She exhibits no distension. There is no tenderness.  Musculoskeletal: Normal range of motion.  Neurological: She is alert and oriented to person, place, and time.  Skin: Skin is warm and dry.  Psychiatric: She has a normal mood and affect. Judgment normal.    ED Course  Procedures (including critical care time) Labs Review Labs Reviewed  CBC WITH DIFFERENTIAL - Abnormal; Notable for the following:    Hemoglobin 11.3 (*)    HCT 35.5 (*)    All other components within normal limits  BASIC METABOLIC PANEL - Abnormal; Notable for the following:    Glucose, Bld 161 (*)    GFR calc non Af Amer 66 (*)    GFR calc Af Amer 76 (*)    All other components within normal limits  Randolm Idol, ED    Imaging Review Dg Chest 2 View  11/09/2013   CLINICAL DATA:  Chest pain.  Palpitations.  Weakness.  EXAM: CHEST  2 VIEW  COMPARISON:  10/19/2013  FINDINGS: Mild cardiomegaly stable as well as ectasia of the thoracic aorta. Both lungs are clear. No evidence of pleural effusion. Patient has undergone previous CABG and aortic valve replacement.  IMPRESSION: Stable mild cardiomegaly.  No active lung disease.   Electronically Signed   By: Earle Gell M.D.   On: 11/09/2013 08:31     EKG Interpretation   Date/Time:  Monday November 09 2013 07:46:53 EDT Ventricular Rate:  91 PR Interval:  198 QRS Duration: 86 QT Interval:  408 QTC Calculation: 501 R Axis:   17 Text Interpretation:  Normal  sinus rhythm Possible Lateral infarct , age  undetermined Possible Inferior infarct , age undetermined Abnormal ECG  nonspecific anterior T wave changes as compared to prior ecg Confirmed by  Zamzam Whinery  MD, Lennette Bihari ([id number]) on 11/09/2013 8:03:57 AM      MDM   Final diagnoses:  Palpitations    Suspect PVCs versus PACs versus paroxysmal atrial fibrillation.  Patient will followup with cardiology.  She is scheduled to see cardiology in 2 days.  I will order an outpatient Holter monitor for the patient.  I think the patient will likely be alert tolerating 12.5 milligrams of metoprolol twice a day.  She'll be given a prescription for this.  She understands to return to the ER for new or worsening symptoms.  She understands importance of close followup.    Hoy Morn, MD 11/09/13 [phone]

## 2013-11-09 NOTE — ED Notes (Signed)
Pt presents to department for evaluation of chest heaviness and palpitations. Onset this morning. Recent open heart surgery x1 month ago. Also states SOB, especially on exertion. Pt is alert and oriented x4.

## 2013-11-10 ENCOUNTER — Other Ambulatory Visit: Payer: Self-pay | Admitting: *Deleted

## 2013-11-10 DIAGNOSIS — J45909 Unspecified asthma, uncomplicated: Secondary | ICD-10-CM | POA: Diagnosis not present

## 2013-11-10 DIAGNOSIS — J449 Chronic obstructive pulmonary disease, unspecified: Secondary | ICD-10-CM | POA: Diagnosis not present

## 2013-11-10 DIAGNOSIS — Z48812 Encounter for surgical aftercare following surgery on the circulatory system: Secondary | ICD-10-CM | POA: Diagnosis not present

## 2013-11-10 DIAGNOSIS — G8918 Other acute postprocedural pain: Secondary | ICD-10-CM

## 2013-11-10 DIAGNOSIS — F329 Major depressive disorder, single episode, unspecified: Secondary | ICD-10-CM | POA: Diagnosis not present

## 2013-11-10 DIAGNOSIS — E119 Type 2 diabetes mellitus without complications: Secondary | ICD-10-CM | POA: Diagnosis not present

## 2013-11-10 DIAGNOSIS — F3289 Other specified depressive episodes: Secondary | ICD-10-CM | POA: Diagnosis not present

## 2013-11-10 DIAGNOSIS — I2510 Atherosclerotic heart disease of native coronary artery without angina pectoris: Secondary | ICD-10-CM | POA: Diagnosis not present

## 2013-11-10 MED ORDER — HYDROMORPHONE HCL 2 MG PO TABS: 2.0000 mg | ORAL_TABLET | Freq: Four times a day (QID) | ORAL | Status: DC | PRN

## 2013-11-11 ENCOUNTER — Encounter: Payer: Self-pay | Admitting: Physician Assistant

## 2013-11-11 ENCOUNTER — Ambulatory Visit (INDEPENDENT_AMBULATORY_CARE_PROVIDER_SITE_OTHER): Payer: Medicare Other | Admitting: Physician Assistant

## 2013-11-11 ENCOUNTER — Other Ambulatory Visit: Payer: Self-pay | Admitting: Thoracic Surgery (Cardiothoracic Vascular Surgery)

## 2013-11-11 ENCOUNTER — Ambulatory Visit (HOSPITAL_COMMUNITY): Payer: Medicare Other | Attending: Cardiovascular Disease | Admitting: Radiology

## 2013-11-11 VITALS — BP 110/80 | HR 72 | Ht 66.0 in | Wt 177.0 lb

## 2013-11-11 DIAGNOSIS — Z954 Presence of other heart-valve replacement: Secondary | ICD-10-CM | POA: Diagnosis not present

## 2013-11-11 DIAGNOSIS — Z952 Presence of prosthetic heart valve: Secondary | ICD-10-CM

## 2013-11-11 DIAGNOSIS — I359 Nonrheumatic aortic valve disorder, unspecified: Secondary | ICD-10-CM | POA: Diagnosis not present

## 2013-11-11 DIAGNOSIS — I2510 Atherosclerotic heart disease of native coronary artery without angina pectoris: Secondary | ICD-10-CM

## 2013-11-11 DIAGNOSIS — E7800 Pure hypercholesterolemia, unspecified: Secondary | ICD-10-CM

## 2013-11-11 DIAGNOSIS — I10 Essential (primary) hypertension: Secondary | ICD-10-CM

## 2013-11-11 DIAGNOSIS — R002 Palpitations: Secondary | ICD-10-CM

## 2013-11-11 NOTE — Progress Notes (Signed)
Cardiology Office Note   Date:  11/11/2013   ID:  [name], [name] 1945/08/21, MRN [id number]  PCP:  Arnette Norris, MD  Cardiologist:  Dr. Ida Rogue [facility])     History of Present Illness: [name] is a 68 y.o. female with a hx of anemia, HTN, HL, DM, COPD, CAD s/p DES in 10/2007 to RCA and mid CFX.  She was admitted in 10/2013 at [facility] with chest pain.  Echo demonstrated normal LVF, mod AI and mild AS.  Myoview was negative for ischemia.  She followed up with Dr. Ida Rogue in [place] with continued symptoms and was set up for [facility].  This demonstrated 3v CAD with severe prox LAD stenosis and ostial RCA.  She was referred for CABG.  Intraoperative TEE demonstrated mild AS and mod to severe AI.  She underwent CABG with Dr. Cyndia Bent 10/12/13 (L-LAD, S-RCA) + AVR with a bioprosthetic valve (21 mm [employer]).  Post op course was fairly uneventful.  She remained in NSR.   Of note, she was d/c 10/20/13.  She was seen in the ED 11/09/13 with palpitations.  ECG demonstrated NSR.  She returns for follow up.    She has been slow to progress. She has home health physical therapy coming out twice a week. She occasionally has dyspnea. She has significant chest soreness from her surgery. She is still sleeping sitting up. She denies PND or lower extremity edema. Weights has been fairly stable. She denies syncope. Palpitations on 6/8 felt more like a skipping sensation. She has not had a recurrence.   Studies:  - LHC (10/08/13):  LAD: proximal 50% stenosis tapering into a 95% stenosis just at the large septal perforating branch. Apical 80% stenosis just as the vessel wraps around the apex. CFX:  proximal stented segment with diffuse 30% restenosis. RCA 90% ostial stenosis. Proximal stented segment is patent without restenosis. The mid vessel just beyond the stented segment has a 50% stenosis.  EF 55%   - Echo (10/2013):  EF 40-98%, diastolic dysfunction, moderate aortic valve regurgitation, mild  AS, mildly elevated right ventricular systolic pressures  - Nuclear (10/2013):  no ischemia, no wall motion abnormalities  - Carotid US (10/12/13):  1-39%  right internal carotid artery and the left internal carotid artery.   CXR (11/09/13): IMPRESSION:  Stable mild cardiomegaly. No active lung disease.   Recent Labs: 09/11/2013: TSH 1.539  10/08/2013: ALT 8; Pro B Natriuretic peptide (BNP) 225.2*  11/09/2013: Creatinine 0.88; Hemoglobin 11.3*; Potassium 4.1   Wt Readings from Last 3 Encounters:  11/11/13 177 lb (80.287 kg)  10/19/13 172 lb 6.4 oz (78.2 kg)  10/19/13 172 lb 6.4 oz (78.2 kg)     Past Medical History  Diagnosis Date  . Allergic rhinitis   . Diverticulitis of colon   . GERD (gastroesophageal reflux disease)   . Chronic depression   . Hypercholesterolemia     intolerance of statins and niaspan  . Apnea, sleep     mild, intolerant of cpap  . ACE-inhibitor cough   . Cataract   . Chronic headache   . Asthma, acute   . Hearing loss   . History of blood transfusion 2013  . Coronary artery disease   . Diabetes mellitus     type 2  . Shortness of breath   . Anxiety     Current Outpatient Prescriptions  Medication Sig Dispense Refill  . albuterol (PROVENTIL HFA;VENTOLIN HFA) 108 (90 BASE) MCG/ACT inhaler Inhale 2  puffs into the lungs every 6 (six) hours as needed for wheezing or shortness of breath. As needed for wheezing  1 Inhaler  6  . aspirin EC 325 MG tablet Take 1 tablet (325 mg total) by mouth daily.  30 tablet  0  . Calcium-Vitamin D (CALTRATE 600 PLUS-VIT D PO) Take 2 tablets by mouth 2 (two) times daily.       Marland Kitchen FLUoxetine (PROZAC) 40 MG capsule Take 40 mg by mouth 2 (two) times daily.      . furosemide (LASIX) 40 MG tablet Take 40 mg by mouth daily as needed for fluid.      Marland Kitchen HYDROmorphone (DILAUDID) 2 MG tablet Take 1 tablet (2 mg total) by mouth every 6 (six) hours as needed for severe pain.  40 tablet  0  . insulin lispro protamine-lispro (HUMALOG  75/25 MIX) (75-25) 100 UNIT/ML SUSP injection Inject 50 Units into the skin 2 (two) times daily with a meal. Note: start with half your regular dose until you are eating better, then increase to your regular home dose  10 mL  11  . lansoprazole (PREVACID) 30 MG capsule Take one by mouth before meal twice daily  180 capsule  3  . levothyroxine (SYNTHROID, LEVOTHROID) 150 MCG tablet Take 150 mcg by mouth daily.        Marland Kitchen LORazepam (ATIVAN) 0.5 MG tablet Take 0.5 mg by mouth 2 (two) times daily.       . metoprolol (LOPRESSOR) 12.5 mg TABS tablet Take 0.5 tablets (12.5 mg total) by mouth 2 (two) times daily.  60 tablet  0  . nitroGLYCERIN (NITROLINGUAL) 0.4 MG/SPRAY spray Place 1 spray under the tongue as needed. Chest pain  12 g  3  . potassium chloride (K-DUR) 10 MEQ tablet Take 10 mEq by mouth daily as needed. Takes when she takes furosemide.      . promethazine (PHENERGAN) 12.5 MG tablet Take 1 tablet (12.5 mg total) by mouth every 6 (six) hours as needed for nausea or vomiting (1 or 2 tablets every 6 hours for nausea).  30 tablet  0  . traMADol (ULTRAM) 50 MG tablet Take 1 tablet (50 mg total) by mouth every 4 (four) hours as needed for moderate pain.  30 tablet  0  . traZODone (DESYREL) 100 MG tablet Take 200 mg by mouth at bedtime.       No current facility-administered medications for this visit.    Allergies:   Benadryl; Voltaren; Budesonide-formoterol fumarate; Amitriptyline; Ativan; Atorvastatin; Bupropion hcl; Codeine sulfate; Demerol; Gabapentin; Lisinopril; Meperidine hcl; Metformin; Mirtazapine; Mometasone furoate; Morphine sulfate; Olanzapine; Other; Oxycodone-acetaminophen; Propoxyphene n-acetaminophen; Rosuvastatin; Shellfish allergy; Venlafaxine; Zolpidem tartrate; and Latex   Social History:  The patient  reports that she has been smoking Cigarettes.  She has a 15 pack-year smoking history. She has never used smokeless tobacco. She reports that she does not drink alcohol or use illicit  drugs.   Family History:  The patient's family history includes Asthma in her father; Breast cancer in her mother; Cancer in her father; Heart disease in an other family member; Hypertension in her father; Stroke in her paternal grandfather.   ROS:  Please see the history of present illness.   She has been constipated.   All other systems reviewed and negative.   PHYSICAL EXAM: VS:  BP 110/80  Pulse 72  Ht 5\' 6"  (1.676 m)  Wt 177 lb (80.287 kg)  BMI 28.58 kg/m2 Well nourished, well developed, in no acute distress  HEENT: normal Neck: no JVD Cardiac:  normal S1, S2; RRR; 0-3/5 systolic murmurRUSB Lungs:  clear to auscultation bilaterally, no wheezing, rhonchi or rales Abd: soft, nontender, no hepatomegaly Ext: no edema Skin: warm and dry Neuro:  CNs 2-12 intact, no focal abnormalities noted  EKG:  NSR, HR 72, normal axis, T-wave inversions in 1, aVL, V1-V2, first-degree AV block (PR 232)      ASSESSMENT AND PLAN:  1. Coronary Artery Disease s/p CABG 10/2013: She is making slow progress. Overall, she is fairly stable. Recent chest x-ray demonstrated no edema or effusion. She is intolerant to statins. Continue aspirin. I have encouraged her to pursue cardiac rehabilitation once home health physical therapy is completed. 2. Aortic valve disorders S/P AVR (aortic valve replacement): Arrange followup echocardiogram. We discussed the importance of SBE prophylaxis. 3. Hypertension: Controlled. 4. HYPERCHOLESTEROLEMIA- statin intol: We discussed the results of the recent IMPROVE-IT trial. She is not certain she would like to start Zetia at this time. We can certainly consider this in the future. Also consider referral to lipid clinic. She may be a candidate for a PCSK9 trial.  5. Palpitations: I suspect she had PACs versus PVCs. She has had no recurrence. She is an NSR today. Continue to monitor. If she has recurrence, arrange Holter. 6. Disposition: She prefers to followup in the [place]  office. Dr. [name] did her LHC.  I will arrange follow up with him in 4-6 weeks.    Signed, Versie Starks, MHS 11/11/2013 8:44 AM    [facility] [place], [place], [place]  [address] Phone: [phone]; Fax: [phone]

## 2013-11-11 NOTE — Patient Instructions (Signed)
Your physician has requested that you have an echocardiogram. Echocardiography is a painless test that uses sound waves to create images of your heart. It provides your doctor with information about the size and shape of your heart and how well your heart's chambers and valves are working. This procedure takes approximately one hour. There are no restrictions for this procedure.  PLEASE CALL THE OFFICE [phone] TO LET us KNOW WHEN YOU ARE DONE WITH HOME PHYSICAL THERAPY SO WE MAY ARRANGE CARDIAC REHAB FOR YOU A TTHAT TIME.  CALL IF RECURRENT PALPITATIONS SO THAT HOLTER  MONITOR MAY BE ARRANGED AT THAT TIME  PLEASE FOLLOW UP WITH DR. Angelena Form IN ABOUT 4-6 WEEKS

## 2013-11-11 NOTE — Progress Notes (Signed)
Echocardiogram performed.  

## 2013-11-12 ENCOUNTER — Telehealth: Payer: Self-pay | Admitting: *Deleted

## 2013-11-12 NOTE — Telephone Encounter (Signed)
pt notified about echo results, pt aware normal functioning  AVR. Pt advised to continue current treatment plan, pt said and thank you.

## 2013-11-12 NOTE — Telephone Encounter (Signed)
lmptcb for echo results 

## 2013-11-13 DIAGNOSIS — J45909 Unspecified asthma, uncomplicated: Secondary | ICD-10-CM | POA: Diagnosis not present

## 2013-11-13 DIAGNOSIS — F3289 Other specified depressive episodes: Secondary | ICD-10-CM | POA: Diagnosis not present

## 2013-11-13 DIAGNOSIS — I2510 Atherosclerotic heart disease of native coronary artery without angina pectoris: Secondary | ICD-10-CM | POA: Diagnosis not present

## 2013-11-13 DIAGNOSIS — J449 Chronic obstructive pulmonary disease, unspecified: Secondary | ICD-10-CM | POA: Diagnosis not present

## 2013-11-13 DIAGNOSIS — F329 Major depressive disorder, single episode, unspecified: Secondary | ICD-10-CM | POA: Diagnosis not present

## 2013-11-13 DIAGNOSIS — E119 Type 2 diabetes mellitus without complications: Secondary | ICD-10-CM | POA: Diagnosis not present

## 2013-11-13 DIAGNOSIS — Z48812 Encounter for surgical aftercare following surgery on the circulatory system: Secondary | ICD-10-CM | POA: Diagnosis not present

## 2013-11-16 DIAGNOSIS — I2510 Atherosclerotic heart disease of native coronary artery without angina pectoris: Secondary | ICD-10-CM | POA: Diagnosis not present

## 2013-11-16 DIAGNOSIS — J45909 Unspecified asthma, uncomplicated: Secondary | ICD-10-CM | POA: Diagnosis not present

## 2013-11-16 DIAGNOSIS — F329 Major depressive disorder, single episode, unspecified: Secondary | ICD-10-CM | POA: Diagnosis not present

## 2013-11-16 DIAGNOSIS — J449 Chronic obstructive pulmonary disease, unspecified: Secondary | ICD-10-CM | POA: Diagnosis not present

## 2013-11-16 DIAGNOSIS — E119 Type 2 diabetes mellitus without complications: Secondary | ICD-10-CM | POA: Diagnosis not present

## 2013-11-16 DIAGNOSIS — F3289 Other specified depressive episodes: Secondary | ICD-10-CM | POA: Diagnosis not present

## 2013-11-16 DIAGNOSIS — Z48812 Encounter for surgical aftercare following surgery on the circulatory system: Secondary | ICD-10-CM | POA: Diagnosis not present

## 2013-11-19 DIAGNOSIS — F3289 Other specified depressive episodes: Secondary | ICD-10-CM | POA: Diagnosis not present

## 2013-11-19 DIAGNOSIS — F329 Major depressive disorder, single episode, unspecified: Secondary | ICD-10-CM | POA: Diagnosis not present

## 2013-11-19 DIAGNOSIS — J45909 Unspecified asthma, uncomplicated: Secondary | ICD-10-CM | POA: Diagnosis not present

## 2013-11-19 DIAGNOSIS — E119 Type 2 diabetes mellitus without complications: Secondary | ICD-10-CM | POA: Diagnosis not present

## 2013-11-19 DIAGNOSIS — I2510 Atherosclerotic heart disease of native coronary artery without angina pectoris: Secondary | ICD-10-CM | POA: Diagnosis not present

## 2013-11-19 DIAGNOSIS — Z48812 Encounter for surgical aftercare following surgery on the circulatory system: Secondary | ICD-10-CM | POA: Diagnosis not present

## 2013-11-19 DIAGNOSIS — J449 Chronic obstructive pulmonary disease, unspecified: Secondary | ICD-10-CM | POA: Diagnosis not present

## 2013-11-20 DIAGNOSIS — J449 Chronic obstructive pulmonary disease, unspecified: Secondary | ICD-10-CM | POA: Diagnosis not present

## 2013-11-20 DIAGNOSIS — J45909 Unspecified asthma, uncomplicated: Secondary | ICD-10-CM | POA: Diagnosis not present

## 2013-11-20 DIAGNOSIS — E119 Type 2 diabetes mellitus without complications: Secondary | ICD-10-CM | POA: Diagnosis not present

## 2013-11-20 DIAGNOSIS — F3289 Other specified depressive episodes: Secondary | ICD-10-CM | POA: Diagnosis not present

## 2013-11-20 DIAGNOSIS — Z48812 Encounter for surgical aftercare following surgery on the circulatory system: Secondary | ICD-10-CM | POA: Diagnosis not present

## 2013-11-20 DIAGNOSIS — F329 Major depressive disorder, single episode, unspecified: Secondary | ICD-10-CM | POA: Diagnosis not present

## 2013-11-20 DIAGNOSIS — I2510 Atherosclerotic heart disease of native coronary artery without angina pectoris: Secondary | ICD-10-CM | POA: Diagnosis not present

## 2013-11-24 DIAGNOSIS — I2510 Atherosclerotic heart disease of native coronary artery without angina pectoris: Secondary | ICD-10-CM | POA: Diagnosis not present

## 2013-11-24 DIAGNOSIS — F329 Major depressive disorder, single episode, unspecified: Secondary | ICD-10-CM | POA: Diagnosis not present

## 2013-11-24 DIAGNOSIS — J45909 Unspecified asthma, uncomplicated: Secondary | ICD-10-CM | POA: Diagnosis not present

## 2013-11-24 DIAGNOSIS — Z48812 Encounter for surgical aftercare following surgery on the circulatory system: Secondary | ICD-10-CM | POA: Diagnosis not present

## 2013-11-24 DIAGNOSIS — F3289 Other specified depressive episodes: Secondary | ICD-10-CM | POA: Diagnosis not present

## 2013-11-24 DIAGNOSIS — E119 Type 2 diabetes mellitus without complications: Secondary | ICD-10-CM | POA: Diagnosis not present

## 2013-11-24 DIAGNOSIS — J449 Chronic obstructive pulmonary disease, unspecified: Secondary | ICD-10-CM | POA: Diagnosis not present

## 2013-11-25 ENCOUNTER — Ambulatory Visit: Payer: Medicare Other | Admitting: Surgery

## 2013-11-25 ENCOUNTER — Other Ambulatory Visit: Payer: Self-pay | Admitting: Family Medicine

## 2013-11-25 ENCOUNTER — Other Ambulatory Visit: Payer: Self-pay | Admitting: Surgery

## 2013-11-25 DIAGNOSIS — I359 Nonrheumatic aortic valve disorder, unspecified: Secondary | ICD-10-CM

## 2013-11-25 NOTE — Telephone Encounter (Signed)
Pt requesting medication refill. Last f/u appt 09/2013 with no future appts scheduled. pls advise

## 2013-11-26 ENCOUNTER — Encounter: Payer: Self-pay | Admitting: *Deleted

## 2013-11-26 DIAGNOSIS — Z48812 Encounter for surgical aftercare following surgery on the circulatory system: Secondary | ICD-10-CM | POA: Diagnosis not present

## 2013-11-26 DIAGNOSIS — E119 Type 2 diabetes mellitus without complications: Secondary | ICD-10-CM | POA: Diagnosis not present

## 2013-11-26 DIAGNOSIS — J45909 Unspecified asthma, uncomplicated: Secondary | ICD-10-CM | POA: Diagnosis not present

## 2013-11-26 DIAGNOSIS — I2510 Atherosclerotic heart disease of native coronary artery without angina pectoris: Secondary | ICD-10-CM | POA: Diagnosis not present

## 2013-11-26 DIAGNOSIS — F329 Major depressive disorder, single episode, unspecified: Secondary | ICD-10-CM | POA: Diagnosis not present

## 2013-11-26 DIAGNOSIS — J449 Chronic obstructive pulmonary disease, unspecified: Secondary | ICD-10-CM | POA: Diagnosis not present

## 2013-11-26 DIAGNOSIS — F3289 Other specified depressive episodes: Secondary | ICD-10-CM | POA: Diagnosis not present

## 2013-11-26 NOTE — Telephone Encounter (Signed)
This encounter was created in error - please disregard.

## 2013-11-26 NOTE — Telephone Encounter (Signed)
rx called into pharmacy

## 2013-11-27 ENCOUNTER — Ambulatory Visit
Admission: RE | Admit: 2013-11-27 | Discharge: 2013-11-27 | Disposition: A | Discharge status: Home / Self Care | Payer: Medicare Other | Source: Ambulatory Visit | Attending: Surgery | Admitting: Surgery

## 2013-11-27 ENCOUNTER — Ambulatory Visit (INDEPENDENT_AMBULATORY_CARE_PROVIDER_SITE_OTHER): Payer: Medicare Other | Admitting: Surgery

## 2013-11-27 ENCOUNTER — Encounter (INDEPENDENT_AMBULATORY_CARE_PROVIDER_SITE_OTHER): Payer: Self-pay

## 2013-11-27 ENCOUNTER — Other Ambulatory Visit: Payer: Self-pay | Admitting: *Deleted

## 2013-11-27 ENCOUNTER — Encounter: Payer: Self-pay | Admitting: Surgery

## 2013-11-27 VITALS — BP 143/83 | HR 84 | Resp 16 | Ht 66.0 in | Wt 173.0 lb

## 2013-11-27 DIAGNOSIS — Z951 Presence of aortocoronary bypass graft: Secondary | ICD-10-CM

## 2013-11-27 DIAGNOSIS — I351 Nonrheumatic aortic (valve) insufficiency: Secondary | ICD-10-CM

## 2013-11-27 DIAGNOSIS — J90 Pleural effusion, not elsewhere classified: Secondary | ICD-10-CM | POA: Diagnosis not present

## 2013-11-27 DIAGNOSIS — G8918 Other acute postprocedural pain: Secondary | ICD-10-CM

## 2013-11-27 DIAGNOSIS — Z954 Presence of other heart-valve replacement: Secondary | ICD-10-CM

## 2013-11-27 DIAGNOSIS — I2584 Coronary atherosclerosis due to calcified coronary lesion: Secondary | ICD-10-CM

## 2013-11-27 DIAGNOSIS — I359 Nonrheumatic aortic valve disorder, unspecified: Secondary | ICD-10-CM

## 2013-11-27 DIAGNOSIS — Z952 Presence of prosthetic heart valve: Secondary | ICD-10-CM

## 2013-11-27 DIAGNOSIS — I2510 Atherosclerotic heart disease of native coronary artery without angina pectoris: Secondary | ICD-10-CM

## 2013-11-27 MED ORDER — HYDROMORPHONE HCL 2 MG PO TABS: 2.0000 mg | ORAL_TABLET | Freq: Four times a day (QID) | ORAL | Status: DC | PRN

## 2013-11-27 NOTE — Progress Notes (Signed)
HPI:  Patient returns for routine postoperative follow-up having undergone CABG x 2 and AVR with a pericardial valve on 10/13/2013. The patient's early postoperative recovery while in the hospital was notable for a slow postop course. Since hospital discharge the patient reports that she has had a fair amount of pain along the left anterior chest wall. She is still taking Dilaudid prn for pain and wants another prescription for pain. She is allergic to oxycodone and says Ultram did nothing for her pain. She is walking short distances.   Current Outpatient Prescriptions  Medication Sig Dispense Refill  . albuterol (PROVENTIL HFA;VENTOLIN HFA) 108 (90 BASE) MCG/ACT inhaler Inhale 2 puffs into the lungs every 6 (six) hours as needed for wheezing or shortness of breath. As needed for wheezing  1 Inhaler  6  . aspirin EC 325 MG tablet Take 1 tablet (325 mg total) by mouth daily.  30 tablet  0  . Calcium-Vitamin D (CALTRATE 600 PLUS-VIT D PO) Take 2 tablets by mouth 2 (two) times daily.       Marland Kitchen FLUoxetine (PROZAC) 40 MG capsule Take 40 mg by mouth 2 (two) times daily.      . furosemide (LASIX) 40 MG tablet Take 40 mg by mouth daily as needed for fluid.      Marland Kitchen insulin lispro protamine-lispro (HUMALOG 75/25 MIX) (75-25) 100 UNIT/ML SUSP injection Inject 50 Units into the skin 2 (two) times daily with a meal. Note: start with half your regular dose until you are eating better, then increase to your regular home dose  10 mL  11  . lansoprazole (PREVACID) 30 MG capsule Take one by mouth before meal twice daily  180 capsule  3  . levothyroxine (SYNTHROID, LEVOTHROID) 150 MCG tablet Take 150 mcg by mouth daily.        Marland Kitchen LORazepam (ATIVAN) 0.5 MG tablet Take 0.5 mg by mouth 2 (two) times daily.       . nitroGLYCERIN (NITROLINGUAL) 0.4 MG/SPRAY spray Place 1 spray under the tongue as needed. Chest pain  12 g  3  . potassium chloride (K-DUR) 10 MEQ tablet Take 10 mEq by mouth daily as needed. Takes when  she takes furosemide.      . promethazine (PHENERGAN) 12.5 MG tablet TAKE 1 OR 2 TABLET EVERY 6 HOURS AS NEEDED FOR NAUSEA AND VOMITTING  28 tablet  0  . traMADol (ULTRAM) 50 MG tablet TAKE ONE TABLET BY MOUTH EVERY SIX HOURS AS NEEDED  50 tablet  0  . HYDROmorphone (DILAUDID) 2 MG tablet Take 1 tablet (2 mg total) by mouth every 6 (six) hours as needed for severe pain.  40 tablet  0  . traZODone (DESYREL) 100 MG tablet TAKE TWO TABLETS BY MOUTH NIGHTLY AT BEDTIME  60 tablet  0   No current facility-administered medications for this visit.    Physical Exam: BP 143/83  Pulse 84  Resp 16  Ht 5\' 6"  (1.676 m)  Wt 173 lb (78.472 kg)  BMI 27.94 kg/m2  SpO2 96% She looks well. Lung exam is clear. Cardiac exam shows a regular rate and rhythm with normal heart sounds. Chest incision is healing well and sternum is stable. The leg incisions are healing well and there is no peripheral edema.    Diagnostic Tests:  CLINICAL DATA: Aortic valve surgery 6 weeks ago, left chest pain  EXAM:  CHEST 2 VIEW  COMPARISON: 11/09/2013  FINDINGS:  Replacement aortic valve noted. Stable mild cardiac  enlargement.  Vascular pattern is normal. Lungs are clear. Mild blunting of the  left costophrenic angle suggests a tiny pleural effusion. This is  new when compared to the prior study.  IMPRESSION:  Tiny but new left pleural effusion  Electronically Signed  By: Skipper Cliche M.D.  On: 11/27/2013 11:44    Impression: Overall I think she is doing well. I encouraged her to continue walking.  I told her that she could drive her car but should not lift anything heavier than 10 lbs for three months postop. I refilled her prescription for Dilaudid prn for pain.   Plan:  She has a follow up appt with Dr. Angelena Form in July and will return to see me if she develops any problems with her incisions.

## 2013-11-27 NOTE — OR Nursing (Signed)
Addendum to scope page and wound class.  

## 2013-12-03 ENCOUNTER — Ambulatory Visit (INDEPENDENT_AMBULATORY_CARE_PROVIDER_SITE_OTHER): Payer: Medicare Other | Admitting: Physician Assistant

## 2013-12-03 ENCOUNTER — Ambulatory Visit: Payer: Self-pay | Admitting: Physician Assistant

## 2013-12-03 ENCOUNTER — Telehealth: Payer: Self-pay | Admitting: *Deleted

## 2013-12-03 ENCOUNTER — Encounter: Payer: Self-pay | Admitting: Physician Assistant

## 2013-12-03 VITALS — BP 130/77 | HR 95 | Ht 66.0 in | Wt 175.0 lb

## 2013-12-03 DIAGNOSIS — R002 Palpitations: Secondary | ICD-10-CM

## 2013-12-03 DIAGNOSIS — Z954 Presence of other heart-valve replacement: Secondary | ICD-10-CM | POA: Diagnosis not present

## 2013-12-03 DIAGNOSIS — Z952 Presence of prosthetic heart valve: Secondary | ICD-10-CM

## 2013-12-03 DIAGNOSIS — E7800 Pure hypercholesterolemia, unspecified: Secondary | ICD-10-CM

## 2013-12-03 DIAGNOSIS — R0602 Shortness of breath: Secondary | ICD-10-CM

## 2013-12-03 DIAGNOSIS — I2510 Atherosclerotic heart disease of native coronary artery without angina pectoris: Secondary | ICD-10-CM

## 2013-12-03 DIAGNOSIS — I10 Essential (primary) hypertension: Secondary | ICD-10-CM | POA: Diagnosis not present

## 2013-12-03 DIAGNOSIS — J90 Pleural effusion, not elsewhere classified: Secondary | ICD-10-CM | POA: Diagnosis not present

## 2013-12-03 DIAGNOSIS — R079 Chest pain, unspecified: Secondary | ICD-10-CM | POA: Diagnosis not present

## 2013-12-03 LAB — BASIC METABOLIC PANEL
BUN: 12 mg/dL (ref 6–23)
CHLORIDE: 100 meq/L (ref 96–112)
CO2: 30 mEq/L (ref 19–32)
Calcium: 8.6 mg/dL (ref 8.4–10.5)
Creatinine, Ser: 0.8 mg/dL (ref 0.4–1.2)
GFR: 76.64 mL/min (ref >60.00)
GLUCOSE: 133 mg/dL — AB (ref 70–99)
POTASSIUM: 4.3 meq/L (ref 3.5–5.1)
Sodium: 137 mEq/L (ref 135–145)

## 2013-12-03 LAB — D-DIMER, QUANTITATIVE (NOT AT ARMC): D DIMER QUANT: 2.54 ug{FEU}/mL — AB (ref 0.00–0.48)

## 2013-12-03 LAB — CBC WITH DIFFERENTIAL/PLATELET
BASOS ABS: 0.1 10*3/uL (ref 0.0–0.1)
Basophils Relative: 0.4 % (ref 0.0–3.0)
Eosinophils Absolute: 0.4 10*3/uL (ref 0.0–0.7)
Eosinophils Relative: 3 % (ref 0.0–5.0)
HEMATOCRIT: 36.5 % (ref 36.0–46.0)
Hemoglobin: 12.1 g/dL (ref 12.0–15.0)
LYMPHS ABS: 2.2 10*3/uL (ref 0.7–4.0)
Lymphocytes Relative: 16.2 % (ref 12.0–46.0)
MCHC: 33.2 g/dL (ref 30.0–36.0)
MCV: 80.2 fl (ref 78.0–100.0)
MONO ABS: 0.9 10*3/uL (ref 0.1–1.0)
Monocytes Relative: 6.5 % (ref 3.0–12.0)
NEUTROS PCT: 73.9 % (ref 43.0–77.0)
Neutro Abs: 10 10*3/uL — ABNORMAL HIGH (ref 1.4–7.7)
Platelets: 216 10*3/uL (ref 150.0–400.0)
RBC: 4.55 Mil/uL (ref 3.87–5.11)
RDW: 15.3 % (ref 11.5–15.5)
WBC: 13.6 10*3/uL — AB (ref 4.0–10.5)

## 2013-12-03 LAB — TROPONIN I

## 2013-12-03 MED ORDER — DILTIAZEM HCL ER COATED BEADS 120 MG PO CP24: 120.0000 mg | ORAL_CAPSULE | Freq: Every day | ORAL | Status: DC

## 2013-12-03 NOTE — Progress Notes (Signed)
Cardiology Office Note   Date:  12/03/2013   ID:  Brindley, Madarang 07-25-1945, MRN [id number]  PCP:  Arnette Norris, MD  Cardiologist:  Dr. Ida Rogue [facility]) => Dr. [name] (patient request)    History of Present Illness: [name] is a 68 y.o. female with a hx of anemia, HTN, HL, DM, COPD, CAD s/p DES in 10/2007 to RCA and mid CFX.  She was admitted in 10/2013 at [facility] with chest pain.  Echo demonstrated normal LVF, mod AI and mild AS.  Myoview was negative for ischemia.  She followed up with Dr. Ida Rogue in [place] with continued symptoms and was set up for [facility].  This demonstrated 3v CAD with severe prox LAD stenosis and ostial RCA.  She was referred for CABG.  Intraoperative TEE demonstrated mild AS and mod to severe AI.  She underwent CABG with Dr. Cyndia Bent 10/12/13 (L-LAD, S-RCA) + AVR with a bioprosthetic valve (21 mm [employer]).  Post op course was fairly uneventful.  She remained in NSR.   She was seen in f/u 6/10.  She had been having episodes of palpitations and was actually seen in the ED.  She has followed up with Dr. Cyndia Bent as well.  F/u echo was ok with normal LVF and normal AVR.   She returns today for evaluation of chest pain.  She has continued to have significant chest discomfort since surgery.  She has lots of different types of chest pain.  Yesterday, she developed L shoulder and chest pain with radiation up her neck.  He chest has felt heavy.  She feels dizzy and more short of breath today.  She notes worsening of some of her symptoms with positional changes.  She continues to take quit a bit of Dilaudid prn for pain.  She also takes tramadol.  She is constipated.  She is not exercising that much.  She has not been referred to [facility] yet.  She is NYHA 2b.  She has been sleeping in a recliner.  She denies PND, edema.  She denies syncope.    Studies:  - Echo (11/2013):  Mild LVH, EF 55%, no RWMA, Gr 1 DD, AVR ok, Mod MAC, mild LAE, mild TR  -  Carotid US (10/12/13):  1-39%  right internal carotid artery and the left internal carotid artery.   CXR (11/09/13): IMPRESSION:  Stable mild cardiomegaly. No active lung disease.   Recent Labs: 09/11/2013: TSH 1.539  10/08/2013: ALT 8; Pro B Natriuretic peptide (BNP) 225.2*  11/09/2013: Creatinine 0.88; Hemoglobin 11.3*; Potassium 4.1   Wt Readings from Last 3 Encounters:  11/27/13 173 lb (78.472 kg)  11/11/13 177 lb (80.287 kg)  10/19/13 172 lb 6.4 oz (78.2 kg)     Past Medical History  Diagnosis Date  . Allergic rhinitis   . Diverticulitis of colon   . GERD (gastroesophageal reflux disease)   . Chronic depression   . Hypercholesterolemia     intolerance of statins and niaspan  . Apnea, sleep     mild, intolerant of cpap  . ACE-inhibitor cough   . Cataract   . Chronic headache   . Asthma, acute   . Hearing loss   . History of blood transfusion 2013  . Coronary artery disease   . Diabetes mellitus     type 2  . Shortness of breath   . Anxiety   . Hx of echocardiogram     Echo (11/2013):  Left ventricle: Mild  concentric hypertrophy. EF 55%. Wall motion was normal. Grade 1 diastolic   Dysfunction.  Aortic valve: A bioprosthesis was present and functioning normally.  Left atrium: The atrium was mildly dilated. Tricuspid valve: There was mild regurgitation.    Current Outpatient Prescriptions  Medication Sig Dispense Refill  . albuterol (PROVENTIL HFA;VENTOLIN HFA) 108 (90 BASE) MCG/ACT inhaler Inhale 2 puffs into the lungs every 6 (six) hours as needed for wheezing or shortness of breath. As needed for wheezing  1 Inhaler  6  . aspirin EC 325 MG tablet Take 1 tablet (325 mg total) by mouth daily.  30 tablet  0  . Calcium-Vitamin D (CALTRATE 600 PLUS-VIT D PO) Take 2 tablets by mouth 2 (two) times daily.       Marland Kitchen FLUoxetine (PROZAC) 40 MG capsule Take 40 mg by mouth 2 (two) times daily.      . furosemide (LASIX) 40 MG tablet Take 40 mg by mouth daily as needed for fluid.       Marland Kitchen HYDROmorphone (DILAUDID) 2 MG tablet Take 1 tablet (2 mg total) by mouth every 6 (six) hours as needed for severe pain.  40 tablet  0  . insulin lispro protamine-lispro (HUMALOG 75/25 MIX) (75-25) 100 UNIT/ML SUSP injection Inject 50 Units into the skin 2 (two) times daily with a meal. Note: start with half your regular dose until you are eating better, then increase to your regular home dose  10 mL  11  . lansoprazole (PREVACID) 30 MG capsule Take one by mouth before meal twice daily  180 capsule  3  . levothyroxine (SYNTHROID, LEVOTHROID) 150 MCG tablet Take 150 mcg by mouth daily.        Marland Kitchen LORazepam (ATIVAN) 0.5 MG tablet Take 0.5 mg by mouth 2 (two) times daily.       . nitroGLYCERIN (NITROLINGUAL) 0.4 MG/SPRAY spray Place 1 spray under the tongue as needed. Chest pain  12 g  3  . potassium chloride (K-DUR) 10 MEQ tablet Take 10 mEq by mouth daily as needed. Takes when she takes furosemide.      . promethazine (PHENERGAN) 12.5 MG tablet TAKE 1 OR 2 TABLET EVERY 6 HOURS AS NEEDED FOR NAUSEA AND VOMITTING  28 tablet  0  . traMADol (ULTRAM) 50 MG tablet TAKE ONE TABLET BY MOUTH EVERY SIX HOURS AS NEEDED  50 tablet  0  . traZODone (DESYREL) 100 MG tablet TAKE TWO TABLETS BY MOUTH NIGHTLY AT BEDTIME  60 tablet  0   No current facility-administered medications for this visit.    Allergies:   Benadryl; Voltaren; Budesonide-formoterol fumarate; Amitriptyline; Ativan; Atorvastatin; Bupropion hcl; Codeine sulfate; Demerol; Gabapentin; Lisinopril; Meperidine hcl; Metformin; Mirtazapine; Mometasone furoate; Morphine sulfate; Olanzapine; Other; Oxycodone-acetaminophen; Propoxyphene n-acetaminophen; Rosuvastatin; Shellfish allergy; Venlafaxine; Zolpidem tartrate; and Latex   Social History:  The patient  reports that she quit smoking about 5 years ago. Her smoking use included Cigarettes. She has a 15 pack-year smoking history. She has never used smokeless tobacco. She reports that she does not drink  alcohol or use illicit drugs.   Family History:  The patient's family history includes Asthma in her father; Breast cancer in her mother; Cancer in her father; Heart disease in an other family member; Hypertension in her father; Stroke in her paternal grandfather.   ROS:  Please see the history of present illness.      All other systems reviewed and negative.   PHYSICAL EXAM: VS:  BP 130/77  Pulse 95  Ht  5\' 6"  (1.676 m)  Wt 175 lb (79.379 kg)  BMI 28.26 kg/m2  SpO2 94% Well nourished, well developed, in no acute distress HEENT: normal Neck: no JVD Cardiac:  normal S1, S2; RRR; 6-5/5 systolic murmurRUSB; no rub Lungs:  clear to auscultation bilaterally, no wheezing, rhonchi or rales Abd: soft, nontender, no hepatomegaly Ext: no edema Skin: warm and dry Neuro:  CNs 2-12 intact, no focal abnormalities noted  EKG:   NSR, HR 95, normal axis, nonspecific ST-T wave changes    ASSESSMENT AND PLAN:  1. Chest Pain:  She continues to have a lot of chest pain.  She has been slow to progress since surgery.  She is taking way too much pain medication.  She is not increasing her activity like she should.  She seems to have noted some of a change in her symptoms.  Obtain BMET, CBC, DDimer, Troponin.  If Troponin +, admit for possible cath.  If DDimer +, obtain Chest CTA to r/o PE.  If Troponin neg, obtain Lexiscan Myoview.  I have asked her to try to take Tylenol 1000 mg 2x to 3x a day scheduled and to take Dilaudid only if absolutely necessary to limit her narcotic pain medication use.  She may be having rebound pain and I think the narcotics are causing her constipation.   2. Coronary Artery Disease s/p CABG 10/2013:  Refer to cardiac rehab at [facility].  Obtain Troponin.  If +, admit for cath;  If neg, proceed with myoview.  I doubt she has had graft failure this soon.  But, given her symptoms and hx of diabetes, will investigate further.   3. Aortic valve disorders S/P AVR (aortic valve replacement):   Recent echo with stable AVR. Continue SBE prophylaxis. 4. Hypertension:  Controlled. 5. HYPERCHOLESTEROLEMIA- statin intol:  Consider referral to Lipid clinic or starting PCSK9 once available.   6. Palpitations:  She could not tolerate bet blocker Rx.  She continues to feel her heart race at times.  Question if this is contributing to her symptoms.  Add Diltiazem 120 QD. 7. Disposition:  Keep f/u with Dr. [name] in next couple of weeks.     Signed, Versie Starks, MHS 12/03/2013 8:18 AM    [facility] [place], [place], [place]  [address] Phone: [phone]; Fax: [phone]

## 2013-12-03 NOTE — Patient Instructions (Signed)
START CARDIZEM CD 120 MG DAILY; NEW RX SENT IN TODAY  TRY TAKING TYLENOL 1000 MG 2 TO 30 TIMES A DAILY ON A REGULAR BASIS TO HELP MANAGE PAIN AND LIMIT THE USE OF THE DILAUDID IF POSSIBLE PER SCOTT [employer]AVER, PAC.  LAB WORK TODAY; BMET, CBC W/DIFF, STAT D-DIMER, STAT TROPONIN I  Your physician has requested that you have a lexiscan myoview. For further information please visit [email or web address]. Please follow instruction sheet, as given.  You have been referred to [place] DR. Angelena Form 12/11/13

## 2013-12-03 NOTE — Telephone Encounter (Signed)
s/w pt's husband about the elevated D-dimer 2.54 and that pt needs Chest CTA today at [facility] since this is closer for pt. I advised STAT Troponin normal, rest of labs good as well. Husband verbalized Plan of Care and will get pt to [facility] for 3:30 today CTA

## 2013-12-03 NOTE — Telephone Encounter (Signed)
s/w pt's husband about chest CTA, NO PE, there is a small left effusion, however per [employer]. PA no changes to be made with meds. Husband verbalized understanding to Plan of Care

## 2013-12-04 ENCOUNTER — Other Ambulatory Visit: Payer: Self-pay | Admitting: Thoracic Surgery (Cardiothoracic Vascular Surgery)

## 2013-12-07 ENCOUNTER — Telehealth: Payer: Self-pay | Admitting: *Deleted

## 2013-12-07 ENCOUNTER — Other Ambulatory Visit: Payer: Self-pay | Admitting: *Deleted

## 2013-12-07 ENCOUNTER — Telehealth: Payer: Self-pay | Admitting: Cardiovascular Disease

## 2013-12-07 DIAGNOSIS — G8918 Other acute postprocedural pain: Secondary | ICD-10-CM

## 2013-12-07 MED ORDER — HYDROMORPHONE HCL 2 MG PO TABS: 2.0000 mg | ORAL_TABLET | Freq: Four times a day (QID) | ORAL | Status: DC | PRN

## 2013-12-07 MED ORDER — POTASSIUM CHLORIDE ER 10 MEQ PO TBCR: 10.0000 meq | EXTENDED_RELEASE_TABLET | Freq: Every day | ORAL | Status: DC | PRN

## 2013-12-07 MED ORDER — FUROSEMIDE 40 MG PO TABS: 40.0000 mg | ORAL_TABLET | Freq: Every day | ORAL | Status: DC | PRN

## 2013-12-07 NOTE — Telephone Encounter (Signed)
Spoke to pt and informed her that Rx must be refilled by cardiologist.

## 2013-12-07 NOTE — Telephone Encounter (Signed)
I cannot refill this- needs to be through cardiologist.

## 2013-12-07 NOTE — Telephone Encounter (Signed)
New message    husband calling C/O wife retaining fluid.  PCP advise husband to speak with cardiology   The prescription has no refills.

## 2013-12-07 NOTE — Telephone Encounter (Signed)
Spoke with pt she states has no refills for her furosemide medication. Pt takes one tablet by mouth daily as needed for fluid retention. Pt states she needed it now because she is retaining fluid in her LE. A prescription for Furosemide 40 mg one tablet by mouth daily as needed and K-dur 10 mEq once a day  sent to [facility] in [place]. Pt aware.

## 2013-12-07 NOTE — Telephone Encounter (Signed)
Received a faxed refill request from pharmacy for Furosemide 40 mg, take one by mouth daily for 7 days. Refill request went to Dr. Roxy Manns and his office denied it telling them to contact patient's cardiologist. Hulen Skains and spoke to [place] at [facility] and was advised that the refill request was sent to cardiologist and it was sent back to them showing that refill was not appropriate.  Iona Beard stated that he called the patient and advised her of this and she requested that he send it to Dr. Deborra Medina. Tyrone Apple that he should call the cardiologist and discuss the denial with that office.  Please advise.

## 2013-12-08 ENCOUNTER — Other Ambulatory Visit: Payer: Self-pay | Admitting: Family Medicine

## 2013-12-09 ENCOUNTER — Ambulatory Visit: Payer: Self-pay | Admitting: Internal Medicine

## 2013-12-09 DIAGNOSIS — D509 Iron deficiency anemia, unspecified: Secondary | ICD-10-CM | POA: Diagnosis not present

## 2013-12-09 DIAGNOSIS — E119 Type 2 diabetes mellitus without complications: Secondary | ICD-10-CM | POA: Diagnosis not present

## 2013-12-09 DIAGNOSIS — K219 Gastro-esophageal reflux disease without esophagitis: Secondary | ICD-10-CM | POA: Diagnosis not present

## 2013-12-09 DIAGNOSIS — I10 Essential (primary) hypertension: Secondary | ICD-10-CM | POA: Diagnosis not present

## 2013-12-09 DIAGNOSIS — E039 Hypothyroidism, unspecified: Secondary | ICD-10-CM | POA: Diagnosis not present

## 2013-12-09 DIAGNOSIS — I2510 Atherosclerotic heart disease of native coronary artery without angina pectoris: Secondary | ICD-10-CM | POA: Diagnosis not present

## 2013-12-09 DIAGNOSIS — E785 Hyperlipidemia, unspecified: Secondary | ICD-10-CM | POA: Diagnosis not present

## 2013-12-09 DIAGNOSIS — Z79899 Other long term (current) drug therapy: Secondary | ICD-10-CM | POA: Diagnosis not present

## 2013-12-09 LAB — CBC CANCER CENTER
Basophil #: 0.1 x10 3/mm (ref 0.0–0.1)
Basophil %: 1.3 %
Eosinophil #: 0.4 x10 3/mm (ref 0.0–0.7)
Eosinophil %: 3.8 %
HCT: 37.2 % (ref 35.0–47.0)
HGB: 12 g/dL (ref 12.0–16.0)
LYMPHS ABS: 2.9 x10 3/mm (ref 1.0–3.6)
LYMPHS PCT: 29.6 %
MCH: 26.5 pg (ref 26.0–34.0)
MCHC: 32.3 g/dL (ref 32.0–36.0)
MCV: 82 fL (ref 80–100)
Monocyte #: 0.6 x10 3/mm (ref 0.2–0.9)
Monocyte %: 5.9 %
NEUTROS ABS: 5.9 x10 3/mm (ref 1.4–6.5)
NEUTROS PCT: 59.4 %
Platelet: 254 x10 3/mm (ref 150–440)
RBC: 4.55 10*6/uL (ref 3.80–5.20)
RDW: 15.5 % — ABNORMAL HIGH (ref 11.5–14.5)
WBC: 9.9 x10 3/mm (ref 3.6–11.0)

## 2013-12-09 LAB — FERRITIN: Ferritin (ARMC): 557 ng/mL — ABNORMAL HIGH (ref 8–388)

## 2013-12-09 LAB — IRON AND TIBC
IRON BIND. CAP.(TOTAL): 263 ug/dL (ref 250–450)
IRON SATURATION: 16 %
IRON: 43 ug/dL — AB (ref 50–170)
Unbound Iron-Bind.Cap.: 220 ug/dL

## 2013-12-11 ENCOUNTER — Encounter: Payer: Self-pay | Admitting: Cardiovascular Disease

## 2013-12-11 ENCOUNTER — Encounter: Payer: Self-pay | Admitting: Internal Medicine

## 2013-12-11 ENCOUNTER — Encounter: Payer: Self-pay | Admitting: Cardiology

## 2013-12-11 ENCOUNTER — Ambulatory Visit (INDEPENDENT_AMBULATORY_CARE_PROVIDER_SITE_OTHER): Payer: Medicare Other | Admitting: Cardiovascular Disease

## 2013-12-11 VITALS — BP 136/86 | HR 96 | Ht 66.0 in | Wt 176.0 lb

## 2013-12-11 DIAGNOSIS — E7800 Pure hypercholesterolemia, unspecified: Secondary | ICD-10-CM | POA: Diagnosis not present

## 2013-12-11 DIAGNOSIS — I10 Essential (primary) hypertension: Secondary | ICD-10-CM

## 2013-12-11 DIAGNOSIS — I2510 Atherosclerotic heart disease of native coronary artery without angina pectoris: Secondary | ICD-10-CM

## 2013-12-11 DIAGNOSIS — G8918 Other acute postprocedural pain: Secondary | ICD-10-CM

## 2013-12-11 DIAGNOSIS — Z952 Presence of prosthetic heart valve: Secondary | ICD-10-CM

## 2013-12-11 DIAGNOSIS — Z954 Presence of other heart-valve replacement: Secondary | ICD-10-CM | POA: Diagnosis not present

## 2013-12-11 MED ORDER — HYDROMORPHONE HCL 2 MG PO TABS: 2.0000 mg | ORAL_TABLET | Freq: Four times a day (QID) | ORAL | Status: DC | PRN

## 2013-12-11 NOTE — Patient Instructions (Signed)
Your physician recommends that you schedule a follow-up appointment in:  6 weeks. --Scheduled for January 28, 2014 at 2:15

## 2013-12-11 NOTE — Progress Notes (Signed)
History of Present Illness: 68 y.o. female with a hx of anemia, HTN, HL, DM, COPD, CAD s/p DES in 10/2007 to RCA and mid CFX and CABG May 2015 as well as AVR May 2015 here today for cardiac follow up. She was admitted 10/2013 at [facility] with chest pain. Echo demonstrated normal LVF, mod AI and mild AS. Myoview was negative for ischemia. She followed up with Dr. Ida Rogue in [place] with continued symptoms and was set up for [facility]. This demonstrated 3v CAD with severe prox LAD stenosis and ostial RCA. She was referred for CABG. Intraoperative TEE demonstrated mild AS and mod to severe AI. She underwent CABG with Dr. Cyndia Bent 10/12/13 (L-LAD, S-RCA) + AVR with a bioprosthetic valve (21 mm [employer]). Post op course was fairly uneventful. She remained in NSR. She was seen in f/u 6/10. She had been having episodes of palpitations and was actually seen in the ED. She has followed up with Dr. Cyndia Bent as well. F/u echo was ok with normal LVF and normal AVR. She was seen in our office 12/03/13 by Richardson Dopp, PA-C and still having chest pain. Troponin was negative. D-dimer was elevated. Pt was sent to [facility] for CTA chest to exclude PE that evening. NO PE on chest CT but noted to have small left pleural effusion.   She returns today for follow up. She continues to have chest wall pain, worsened to touch and with deep inspiration. Released by Dr. Cyndia Bent. She has been using narcotics for pain. She denies SOB, PND, edema or syncope.   Primary Care Physician: Deborra Medina  Last Lipid Profile:Lipid Panel     Component Value Date/Time   CHOL 246* 12/24/2008 0835   TRIG 223.0* 12/24/2008 0835   HDL 28.90* 12/24/2008 0835   CHOLHDL 9 12/24/2008 0835   VLDL 44.6* 12/24/2008 0835   LDLCALC 115* 06/23/2008 0000     Past Medical History  Diagnosis Date  . Allergic rhinitis   . Diverticulitis of colon   . GERD (gastroesophageal reflux disease)   . Chronic depression   . Hypercholesterolemia     intolerance of  statins and niaspan  . Apnea, sleep     mild, intolerant of cpap  . ACE-inhibitor cough   . Cataract   . Chronic headache   . Asthma, acute   . Hearing loss   . History of blood transfusion 2013  . Coronary artery disease   . Diabetes mellitus     type 2  . Shortness of breath   . Anxiety   . Hx of echocardiogram     Echo (11/2013):  Left ventricle: Mild   concentric hypertrophy. EF 55%. Wall motion was normal. Grade 1 diastolic   Dysfunction.  Aortic valve: A bioprosthesis was present and functioning normally.  Left atrium: The atrium was mildly dilated. Tricuspid valve: There was mild regurgitation.    Past Surgical History  Procedure Laterality Date  . Cholecystectomy  2001  . Colectomy      lap sigmoid  . Tonsillectomy    . Bartholin gland cyst excision    . Tubal ligation    . Bladder suspension    . Breast cyst excision  1988    bilateral nonmalignant tumors, x3  . Vaginal delivery      3  . Thyroidectomy    . Abdominal hysterectomy w/ partial vaginactomy    . Appendectomy  1964  . Colonoscopy  2014    polyps found, 2 clamped off.  Marland Kitchen  Cardiac catheterization    . Coronary angioplasty  10/29/2007    Prox RCA & Mid Cx.  . Coronary artery bypass graft N/A 10/12/2013    Procedure: CORONARY ARTERY BYPASS GRAFT TIMES TWO;  Surgeon: [name], MD;  Location: [facility];  Service: Open Heart Surgery;  Laterality: N/A;  . Aortic valve replacement N/A 10/12/2013    Procedure: AORTIC VALVE REPLACEMENT (AVR);  Surgeon: [name], MD;  Location: [facility];  Service: Open Heart Surgery;  Laterality: N/A;    Current Outpatient Prescriptions  Medication Sig Dispense Refill  . albuterol (PROVENTIL HFA;VENTOLIN HFA) 108 (90 BASE) MCG/ACT inhaler Inhale 2 puffs into the lungs every 6 (six) hours as needed for wheezing or shortness of breath. As needed for wheezing  1 Inhaler  6  . aspirin EC 325 MG tablet Take 1 tablet (325 mg total) by mouth daily.  30 tablet  0  . Calcium-Vitamin D  (CALTRATE 600 PLUS-VIT D PO) Take 2 tablets by mouth 2 (two) times daily.       Marland Kitchen FLUoxetine (PROZAC) 40 MG capsule TAKE 1 CAPSULE TWICE DAILY  180 capsule  0  . furosemide (LASIX) 40 MG tablet Take 1 tablet (40 mg total) by mouth daily as needed for fluid.  30 tablet  4  . HYDROmorphone (DILAUDID) 2 MG tablet Take 1 tablet (2 mg total) by mouth every 6 (six) hours as needed for severe pain.  40 tablet  0  . insulin lispro protamine-lispro (HUMALOG 75/25 MIX) (75-25) 100 UNIT/ML SUSP injection Inject 50 Units into the skin 2 (two) times daily with a meal. Note: start with half your regular dose until you are eating better, then increase to your regular home dose  10 mL  11  . lansoprazole (PREVACID) 30 MG capsule Take one by mouth before meal twice daily  180 capsule  3  . levothyroxine (SYNTHROID, LEVOTHROID) 150 MCG tablet Take 150 mcg by mouth daily.        Marland Kitchen LORazepam (ATIVAN) 0.5 MG tablet Take 0.5 mg by mouth 2 (two) times daily.       . nitroGLYCERIN (NITROLINGUAL) 0.4 MG/SPRAY spray Place 1 spray under the tongue as needed. Chest pain  12 g  3  . potassium chloride (K-DUR) 10 MEQ tablet Take 1 tablet (10 mEq total) by mouth daily as needed. Takes when she takes furosemide.  30 tablet  4  . promethazine (PHENERGAN) 12.5 MG tablet TAKE 1 OR 2 TABLET EVERY 6 HOURS AS NEEDED FOR NAUSEA AND VOMITTING  28 tablet  0  . traMADol (ULTRAM) 50 MG tablet TAKE ONE TABLET BY MOUTH EVERY SIX HOURS AS NEEDED  50 tablet  0  . traZODone (DESYREL) 100 MG tablet TAKE TWO TABLETS BY MOUTH NIGHTLY AT BEDTIME  60 tablet  0   No current facility-administered medications for this visit.    Allergies  Allergen Reactions  . Benadryl [Diphenhydramine] Shortness Of Breath  . Voltaren [Diclofenac Sodium] Shortness Of Breath  . Budesonide-Formoterol Fumarate     Shakiness, tremors  . Amitriptyline Other (See Comments)    unknown  . Ativan [Lorazepam]      Causes double vision at highter than .5 mg dose  .  Atorvastatin Other (See Comments)    unknown  . Bupropion Hcl Other (See Comments)    "cloud over me" "depressed like"  . Codeine Sulfate Other (See Comments)    Makes chest hurt  . Demerol [Meperidine] Other (See Comments)  unknown  . Gabapentin Other (See Comments)    unknown  . Lisinopril     REACTION: cough  . Meperidine Hcl Other (See Comments)    unknown  . Metformin     REACTION: unspecified  . Mirtazapine Other (See Comments)    unknown  . Mometasone Furoate     REACTION: Nause and vomiting  . Morphine Sulfate     REACTION: Chest pain  . Olanzapine Other (See Comments)    Unknown   . Other     Beta Blockers, reaction unknown but listed  . Oxycodone-Acetaminophen Other (See Comments)    Chest pain  . Propoxyphene N-Acetaminophen Other (See Comments)    Chest pain  . Rosuvastatin     REACTION: Makes patient weak.  . Shellfish Allergy Diarrhea  . Venlafaxine     REACTION: unspecified  . Zolpidem Tartrate     REACTION: Jittery, diarrhea  . Latex Rash    History   Social History  . Marital Status: Married    Spouse Name: N/A    Number of Children: 3  . Years of Education: N/A   Occupational History  . Retired     Quarry manager   Social History Main Topics  . Smoking status: Former Smoker -- 0.50 packs/day for 30 years    Types: Cigarettes    Quit date: 02/03/2008  . Smokeless tobacco: Never Used  . Alcohol Use: No  . Drug Use: No  . Sexual Activity: Not on file   Other Topics Concern  . Not on file   Social History Narrative  . No narrative on file    Family History  Problem Relation Age of Onset  . Breast cancer Mother     breast  . Hypertension Father   . Cancer Father     mesothelioma  . Asthma Father   . Stroke Paternal Grandfather   . Heart disease      Review of Systems:  As stated in the HPI and otherwise negative.   BP 136/86  Pulse 96  Ht 5\' 6"  (1.676 m)  Wt 176 lb (79.833 kg)  BMI 28.42 kg/m2  Physical  Examination: General: Well developed, well nourished, NAD HEENT: OP clear, mucus membranes moist SKIN: warm, dry. No rashes. Neuro: No focal deficits Musculoskeletal: Muscle strength 5/5 all ext Psychiatric: Mood and affect normal Neck: No JVD, no carotid bruits, no thyromegaly, no lymphadenopathy. Lungs:Clear bilaterally, no wheezes, rhonci, crackles Cardiovascular: Regular rate and rhythm. No murmurs, gallops or rubs. Abdomen:Soft. Bowel sounds present. Non-tender.  Extremities: No lower extremity edema. Pulses are 2 + in the bilateral DP/PT.  Assessment and Plan:   1. CAD: s/p recent CABG. Stable. To start cardiac rehab at [facility]. Continue ASA.   2. Aortic valve insufficiency: s/p AVR. Stable. Continue SBE prophylaxis.   3. Hypertension: Controlled.   4. Hyperlipidemia: She is statin intolerant. Consider referral to Lipid clinic   5. Chest wall pain: Likely all related to healing of sternotomy. Exam does not reveal sternal instability. NO evidence of wound infection. Narcotics prn for pain control.

## 2013-12-14 ENCOUNTER — Encounter (HOSPITAL_COMMUNITY): Payer: Medicare Other

## 2013-12-14 DIAGNOSIS — J301 Allergic rhinitis due to pollen: Secondary | ICD-10-CM | POA: Diagnosis not present

## 2013-12-16 DIAGNOSIS — E119 Type 2 diabetes mellitus without complications: Secondary | ICD-10-CM | POA: Diagnosis not present

## 2013-12-16 DIAGNOSIS — E039 Hypothyroidism, unspecified: Secondary | ICD-10-CM | POA: Diagnosis not present

## 2013-12-22 ENCOUNTER — Other Ambulatory Visit: Payer: Self-pay | Admitting: Family Medicine

## 2013-12-22 ENCOUNTER — Telehealth: Payer: Self-pay | Admitting: Physician Assistant

## 2013-12-22 ENCOUNTER — Emergency Department: Payer: Self-pay | Admitting: Emergency Medicine

## 2013-12-22 DIAGNOSIS — E119 Type 2 diabetes mellitus without complications: Secondary | ICD-10-CM | POA: Diagnosis not present

## 2013-12-22 DIAGNOSIS — E785 Hyperlipidemia, unspecified: Secondary | ICD-10-CM | POA: Diagnosis not present

## 2013-12-22 DIAGNOSIS — R079 Chest pain, unspecified: Secondary | ICD-10-CM | POA: Diagnosis not present

## 2013-12-22 DIAGNOSIS — Z951 Presence of aortocoronary bypass graft: Secondary | ICD-10-CM | POA: Diagnosis not present

## 2013-12-22 DIAGNOSIS — Z978 Presence of other specified devices: Secondary | ICD-10-CM | POA: Diagnosis not present

## 2013-12-22 DIAGNOSIS — Z794 Long term (current) use of insulin: Secondary | ICD-10-CM | POA: Diagnosis not present

## 2013-12-22 DIAGNOSIS — Z79899 Other long term (current) drug therapy: Secondary | ICD-10-CM | POA: Diagnosis not present

## 2013-12-22 DIAGNOSIS — IMO0001 Reserved for inherently not codable concepts without codable children: Secondary | ICD-10-CM | POA: Diagnosis not present

## 2013-12-22 DIAGNOSIS — Z9089 Acquired absence of other organs: Secondary | ICD-10-CM | POA: Diagnosis not present

## 2013-12-22 DIAGNOSIS — M25519 Pain in unspecified shoulder: Secondary | ICD-10-CM | POA: Diagnosis not present

## 2013-12-22 LAB — BASIC METABOLIC PANEL
Anion Gap: 7 (ref 7–16)
BUN: 15 mg/dL (ref 7–18)
CREATININE: 0.9 mg/dL (ref 0.60–1.30)
Calcium, Total: 7.9 mg/dL — ABNORMAL LOW (ref 8.5–10.1)
Chloride: 106 mmol/L (ref 98–107)
Co2: 24 mmol/L (ref 21–32)
EGFR (Non-African Amer.): >60
Glucose: 184 mg/dL — ABNORMAL HIGH (ref 65–99)
Osmolality: 279 (ref 275–301)
POTASSIUM: 4 mmol/L (ref 3.5–5.1)
SODIUM: 137 mmol/L (ref 136–145)

## 2013-12-22 LAB — CBC
HCT: 37.2 % (ref 35.0–47.0)
HGB: 11.8 g/dL — AB (ref 12.0–16.0)
MCH: 26.2 pg (ref 26.0–34.0)
MCHC: 31.7 g/dL — AB (ref 32.0–36.0)
MCV: 83 fL (ref 80–100)
PLATELETS: 189 10*3/uL (ref 150–440)
RBC: 4.5 10*6/uL (ref 3.80–5.20)
RDW: 16.4 % — ABNORMAL HIGH (ref 11.5–14.5)
WBC: 10.8 10*3/uL (ref 3.6–11.0)

## 2013-12-22 LAB — TROPONIN I

## 2013-12-22 NOTE — Telephone Encounter (Signed)
New message      Patient c/o neck/back and shoulder pain. She request to be seen today---call sent to triage

## 2013-12-22 NOTE — Telephone Encounter (Signed)
Agree.  F/u with PCP. Richardson Dopp, PA-C   12/22/2013 9:18 PM

## 2013-12-22 NOTE — Telephone Encounter (Signed)
I spoke with pt who states she is hurting from her spine around to her left side. She hurts when she walks, hurts when she moves her left arm. Hurts some when I breathe in on the left side. This has been going on all day.  States all she did was make up the bed this morning. States she has taken Aleve, Tylenol & Dilaudid today without relief.  Denies any shortness of breath, no angina, no chest pain. States this is not like the pain she had when she had "fluid" States she could not get an appointment with pcp today.  Advised pt to have this checked at the Emergency room or Urgent Care as pt lives in [place]. She does not think this is heart related  Horton Chin RN

## 2013-12-23 DIAGNOSIS — I2510 Atherosclerotic heart disease of native coronary artery without angina pectoris: Secondary | ICD-10-CM | POA: Diagnosis not present

## 2013-12-23 DIAGNOSIS — E119 Type 2 diabetes mellitus without complications: Secondary | ICD-10-CM | POA: Diagnosis not present

## 2013-12-23 DIAGNOSIS — E039 Hypothyroidism, unspecified: Secondary | ICD-10-CM | POA: Diagnosis not present

## 2013-12-23 DIAGNOSIS — Z951 Presence of aortocoronary bypass graft: Secondary | ICD-10-CM | POA: Diagnosis not present

## 2013-12-30 DIAGNOSIS — J301 Allergic rhinitis due to pollen: Secondary | ICD-10-CM | POA: Diagnosis not present

## 2014-01-02 ENCOUNTER — Ambulatory Visit: Payer: Self-pay | Admitting: Internal Medicine

## 2014-01-12 ENCOUNTER — Other Ambulatory Visit: Payer: Self-pay | Admitting: Critical Care Medicine

## 2014-01-15 ENCOUNTER — Other Ambulatory Visit: Payer: Self-pay

## 2014-01-15 NOTE — Telephone Encounter (Signed)
Mr Hoffmeister left v/m requesting refill lansoprazole to [facility]; Dr Asencion Noble had been refilling med but denied refill recently;Please advise.

## 2014-01-18 MED ORDER — LANSOPRAZOLE 30 MG PO CPDR: DELAYED_RELEASE_CAPSULE | ORAL | Status: DC

## 2014-01-18 NOTE — Telephone Encounter (Signed)
[name] called for status of refill for lansoprazole to [facility]; spoke with [name] and advised sent electronically today to [facility]. [name] voiced understanding.

## 2014-01-19 ENCOUNTER — Encounter: Payer: Self-pay | Admitting: Family Medicine

## 2014-01-19 ENCOUNTER — Ambulatory Visit (INDEPENDENT_AMBULATORY_CARE_PROVIDER_SITE_OTHER): Payer: Medicare Other | Admitting: Family Medicine

## 2014-01-19 VITALS — BP 120/76 | HR 67 | Temp 97.7°F | Wt 181.0 lb

## 2014-01-19 DIAGNOSIS — R0789 Other chest pain: Secondary | ICD-10-CM | POA: Diagnosis not present

## 2014-01-19 DIAGNOSIS — I2510 Atherosclerotic heart disease of native coronary artery without angina pectoris: Secondary | ICD-10-CM | POA: Diagnosis not present

## 2014-01-19 DIAGNOSIS — E119 Type 2 diabetes mellitus without complications: Secondary | ICD-10-CM | POA: Diagnosis not present

## 2014-01-19 DIAGNOSIS — E7800 Pure hypercholesterolemia, unspecified: Secondary | ICD-10-CM

## 2014-01-19 DIAGNOSIS — I10 Essential (primary) hypertension: Secondary | ICD-10-CM

## 2014-01-19 MED ORDER — PROMETHAZINE HCL 12.5 MG PO TABS: ORAL_TABLET | ORAL | Status: DC

## 2014-01-19 NOTE — Assessment & Plan Note (Signed)
>  25 minutes spent in face to face time with patient, >50% spent in counselling or coordination of care Advised that I will not refill dilaudid at this point but if her pain persists, we need to look further into cause. She will contact cardiology. The patient indicates understanding of these issues and agrees with the plan.

## 2014-01-19 NOTE — Progress Notes (Signed)
Subjective:   Patient ID: [name], female    DOB: 1945-12-09, 68 y.o.   MRN: [id number]  [name] is a pleasant 68 y.o. year old female who presents to clinic today with Follow-up  on 01/19/2014  HPI: Has had a very eventful past few months since I last saw her.  Has CABG in May 2015 due severe CAD with LAD stenosis. Also has mild AS and mo to severe AI.  Had continued chest pain and was actually seen in ED in weeks that followed.  D Dimer was elevated but CTA neg for PE.  Continues to have sternal pain along and around her incision site. Last saw Dr. Angelena Form on 7/10- note reviewed.  He felt pain was likely MSK- related to healing sternotomy and has been prescribing dilaudid.  She is asking me for a refill of this today.  DM- followed by Dr. Gabriel Carina- last saw her last month and has a follow up again in October.  Current Outpatient Prescriptions on File Prior to Visit  Medication Sig Dispense Refill  . albuterol (PROVENTIL HFA;VENTOLIN HFA) 108 (90 BASE) MCG/ACT inhaler Inhale 2 puffs into the lungs every 6 (six) hours as needed for wheezing or shortness of breath. As needed for wheezing  1 Inhaler  6  . aspirin EC 325 MG tablet Take 1 tablet (325 mg total) by mouth daily.  30 tablet  0  . Calcium-Vitamin D (CALTRATE 600 PLUS-VIT D PO) Take 2 tablets by mouth 2 (two) times daily.       Marland Kitchen FLUoxetine (PROZAC) 40 MG capsule TAKE 1 CAPSULE TWICE DAILY  180 capsule  0  . furosemide (LASIX) 40 MG tablet Take 1 tablet (40 mg total) by mouth daily as needed for fluid.  30 tablet  4  . HYDROmorphone (DILAUDID) 2 MG tablet Take 1 tablet (2 mg total) by mouth every 6 (six) hours as needed for severe pain.  40 tablet  0  . insulin lispro protamine-lispro (HUMALOG 75/25 MIX) (75-25) 100 UNIT/ML SUSP injection Inject 50 Units into the skin 2 (two) times daily with a meal. Note: start with half your regular dose until you are eating better, then increase to your regular home dose  10 mL  11  .  lansoprazole (PREVACID) 30 MG capsule Take one by mouth before meal twice daily  180 capsule  1  . levothyroxine (SYNTHROID, LEVOTHROID) 150 MCG tablet Take 150 mcg by mouth daily.        Marland Kitchen LORazepam (ATIVAN) 0.5 MG tablet Take 0.5 mg by mouth 2 (two) times daily.       . nitroGLYCERIN (NITROLINGUAL) 0.4 MG/SPRAY spray Place 1 spray under the tongue as needed. Chest pain  12 g  3  . potassium chloride (K-DUR) 10 MEQ tablet Take 1 tablet (10 mEq total) by mouth daily as needed. Takes when she takes furosemide.  30 tablet  4  . traMADol (ULTRAM) 50 MG tablet TAKE ONE TABLET BY MOUTH EVERY SIX HOURS AS NEEDED  50 tablet  0  . traZODone (DESYREL) 100 MG tablet TAKE TWO TABLETS BY MOUTH NIGHTLY AT BEDTIME  60 tablet  1   No current facility-administered medications on file prior to visit.    Allergies  Allergen Reactions  . Benadryl [Diphenhydramine] Shortness Of Breath  . Voltaren [Diclofenac Sodium] Shortness Of Breath  . Budesonide-Formoterol Fumarate     Shakiness, tremors  . Amitriptyline Other (See Comments)    unknown  . Ativan [Lorazepam]  Causes double vision at highter than .5 mg dose  . Atorvastatin Other (See Comments)    unknown  . Bupropion Hcl Other (See Comments)    "cloud over me" "depressed like"  . Codeine Sulfate Other (See Comments)    Makes chest hurt  . Demerol [Meperidine] Other (See Comments)    unknown  . Gabapentin Other (See Comments)    unknown  . Lisinopril     REACTION: cough  . Meperidine Hcl Other (See Comments)    unknown  . Metformin     REACTION: unspecified  . Mirtazapine Other (See Comments)    unknown  . Mometasone Furoate     REACTION: Nause and vomiting  . Morphine Sulfate     REACTION: Chest pain  . Olanzapine Other (See Comments)    Unknown   . Other     Beta Blockers, reaction unknown but listed  . Oxycodone-Acetaminophen Other (See Comments)    Chest pain  . Propoxyphene N-Acetaminophen Other (See Comments)    Chest pain    . Rosuvastatin     REACTION: Makes patient weak.  . Shellfish Allergy Diarrhea  . Venlafaxine     REACTION: unspecified  . Zolpidem Tartrate     REACTION: Jittery, diarrhea  . Latex Rash    Past Medical History  Diagnosis Date  . Allergic rhinitis   . Diverticulitis of colon   . GERD (gastroesophageal reflux disease)   . Chronic depression   . Hypercholesterolemia     intolerance of statins and niaspan  . Apnea, sleep     mild, intolerant of cpap  . ACE-inhibitor cough   . Cataract   . Chronic headache   . Asthma, acute   . Hearing loss   . History of blood transfusion 2013  . Coronary artery disease   . Diabetes mellitus     type 2  . Shortness of breath   . Anxiety   . Hx of echocardiogram     Echo (11/2013):  Left ventricle: Mild   concentric hypertrophy. EF 55%. Wall motion was normal. Grade 1 diastolic   Dysfunction.  Aortic valve: A bioprosthesis was present and functioning normally.  Left atrium: The atrium was mildly dilated. Tricuspid valve: There was mild regurgitation.    Past Surgical History  Procedure Laterality Date  . Cholecystectomy  2001  . Colectomy      lap sigmoid  . Tonsillectomy    . Bartholin gland cyst excision    . Tubal ligation    . Bladder suspension    . Breast cyst excision  1988    bilateral nonmalignant tumors, x3  . Vaginal delivery      3  . Thyroidectomy    . Abdominal hysterectomy w/ partial vaginactomy    . Appendectomy  1964  . Colonoscopy  2014    polyps found, 2 clamped off.  . Cardiac catheterization    . Coronary angioplasty  10/29/2007    Prox RCA & Mid Cx.  . Coronary artery bypass graft N/A 10/12/2013    Procedure: CORONARY ARTERY BYPASS GRAFT TIMES TWO;  Surgeon: [name], MD;  Location: [facility];  Service: Open Heart Surgery;  Laterality: N/A;  . Aortic valve replacement N/A 10/12/2013    Procedure: AORTIC VALVE REPLACEMENT (AVR);  Surgeon: [name], MD;  Location: [facility];  Service: Open Heart Surgery;   Laterality: N/A;    Family History  Problem Relation Age of Onset  . Breast cancer Mother  breast  . Hypertension Father   . Cancer Father     mesothelioma  . Asthma Father   . Stroke Paternal Grandfather   . Heart disease      History   Social History  . Marital Status: Married    Spouse Name: N/A    Number of Children: 3  . Years of Education: N/A   Occupational History  . Retired     Quarry manager   Social History Main Topics  . Smoking status: Former Smoker -- 0.50 packs/day for 30 years    Types: Cigarettes    Quit date: 02/03/2008  . Smokeless tobacco: Never Used  . Alcohol Use: No  . Drug Use: No  . Sexual Activity: Not on file   Other Topics Concern  . Not on file   Social History Narrative  . No narrative on file   The PMH, PSH, Social History, Family History, Medications, and allergies have been reviewed in [facility], and have been updated if relevant.  Review of Systems    See HPI + CP No SOB +fatigue- has declined cardiac rehab No LE edema  Objective:    BP 120/76  Pulse 67  Temp(Src) 97.7 F (36.5 C) (Oral)  Wt 181 lb (82.101 kg)  SpO2 97%   Physical Exam  Gen:  Alert, NAD Chest:  Large well healed vertical surgical scar Ext:  No edema    Assessment & Plan:   No diagnosis found. No Follow-up on file.

## 2014-01-19 NOTE — Assessment & Plan Note (Signed)
Statin intolerant 

## 2014-01-19 NOTE — Assessment & Plan Note (Signed)
Well controlled on current rx. No changes today. 

## 2014-01-19 NOTE — Progress Notes (Signed)
Pre visit review using our clinic review tool, if applicable. No additional management support is needed unless otherwise documented below in the visit note. 

## 2014-01-19 NOTE — Assessment & Plan Note (Signed)
Followed by Dr. Gabriel Carina. Per pt, a1c was ok- will request records.

## 2014-01-20 ENCOUNTER — Other Ambulatory Visit: Payer: Self-pay | Admitting: Family Medicine

## 2014-01-20 ENCOUNTER — Telehealth: Payer: Self-pay | Admitting: Family Medicine

## 2014-01-20 NOTE — Telephone Encounter (Signed)
Relevant patient education mailed to patient.  

## 2014-01-21 NOTE — Telephone Encounter (Signed)
Pt requesting medication refill. Last f/u appt 01/2014 with no future appts scheduled. pls advise

## 2014-01-21 NOTE — Telephone Encounter (Signed)
Rx called in to requested pharmacy 

## 2014-01-22 ENCOUNTER — Other Ambulatory Visit: Payer: Medicare Other

## 2014-01-22 ENCOUNTER — Telehealth: Payer: Self-pay | Admitting: Cardiovascular Disease

## 2014-01-22 NOTE — Telephone Encounter (Signed)
New message      Want a refill on her dilaudid----want only enough to last until her appt next week.  Please call it in to [facility]

## 2014-01-22 NOTE — Telephone Encounter (Signed)
Spoke with pt's husband who is requesting refill of dilaudid for pt. Pt has appt with Dr. Angelena Form next week. I told pt's husband that Dr. Angelena Form would not be back in office until August 24,2015 and we could not refill until checking with him.

## 2014-01-24 ENCOUNTER — Emergency Department (HOSPITAL_COMMUNITY): Payer: Medicare Other

## 2014-01-24 ENCOUNTER — Encounter (HOSPITAL_COMMUNITY): Payer: Self-pay | Admitting: Emergency Medicine

## 2014-01-24 ENCOUNTER — Emergency Department (HOSPITAL_COMMUNITY)
Admission: EM | Admit: 2014-01-24 | Discharge: 2014-01-24 | Disposition: A | Discharge status: Home / Self Care | Payer: Medicare Other | Attending: Emergency Medicine | Admitting: Emergency Medicine

## 2014-01-24 DIAGNOSIS — R079 Chest pain, unspecified: Secondary | ICD-10-CM | POA: Insufficient documentation

## 2014-01-24 DIAGNOSIS — K219 Gastro-esophageal reflux disease without esophagitis: Secondary | ICD-10-CM | POA: Diagnosis not present

## 2014-01-24 DIAGNOSIS — F3289 Other specified depressive episodes: Secondary | ICD-10-CM | POA: Insufficient documentation

## 2014-01-24 DIAGNOSIS — Z794 Long term (current) use of insulin: Secondary | ICD-10-CM | POA: Diagnosis not present

## 2014-01-24 DIAGNOSIS — R011 Cardiac murmur, unspecified: Secondary | ICD-10-CM | POA: Insufficient documentation

## 2014-01-24 DIAGNOSIS — G4730 Sleep apnea, unspecified: Secondary | ICD-10-CM | POA: Diagnosis not present

## 2014-01-24 DIAGNOSIS — E119 Type 2 diabetes mellitus without complications: Secondary | ICD-10-CM | POA: Insufficient documentation

## 2014-01-24 DIAGNOSIS — Z91040 Latex allergy status: Secondary | ICD-10-CM | POA: Insufficient documentation

## 2014-01-24 DIAGNOSIS — F411 Generalized anxiety disorder: Secondary | ICD-10-CM | POA: Diagnosis not present

## 2014-01-24 DIAGNOSIS — Z79899 Other long term (current) drug therapy: Secondary | ICD-10-CM | POA: Diagnosis not present

## 2014-01-24 DIAGNOSIS — Z87891 Personal history of nicotine dependence: Secondary | ICD-10-CM | POA: Diagnosis not present

## 2014-01-24 DIAGNOSIS — Z7982 Long term (current) use of aspirin: Secondary | ICD-10-CM | POA: Insufficient documentation

## 2014-01-24 DIAGNOSIS — H9190 Unspecified hearing loss, unspecified ear: Secondary | ICD-10-CM | POA: Diagnosis not present

## 2014-01-24 DIAGNOSIS — J45901 Unspecified asthma with (acute) exacerbation: Secondary | ICD-10-CM | POA: Insufficient documentation

## 2014-01-24 DIAGNOSIS — Z951 Presence of aortocoronary bypass graft: Secondary | ICD-10-CM | POA: Diagnosis not present

## 2014-01-24 DIAGNOSIS — F329 Major depressive disorder, single episode, unspecified: Secondary | ICD-10-CM | POA: Diagnosis not present

## 2014-01-24 DIAGNOSIS — I2510 Atherosclerotic heart disease of native coronary artery without angina pectoris: Secondary | ICD-10-CM | POA: Insufficient documentation

## 2014-01-24 DIAGNOSIS — Z9981 Dependence on supplemental oxygen: Secondary | ICD-10-CM | POA: Diagnosis not present

## 2014-01-24 DIAGNOSIS — Z9189 Other specified personal risk factors, not elsewhere classified: Secondary | ICD-10-CM | POA: Diagnosis not present

## 2014-01-24 LAB — BASIC METABOLIC PANEL
Anion gap: 13 (ref 5–15)
BUN: 19 mg/dL (ref 6–23)
CHLORIDE: 100 meq/L (ref 96–112)
CO2: 22 meq/L (ref 19–32)
Calcium: 8.5 mg/dL (ref 8.4–10.5)
Creatinine, Ser: 0.74 mg/dL (ref 0.50–1.10)
GFR calc Af Amer: >90 mL/min (ref >90)
GFR calc non Af Amer: 85 mL/min — ABNORMAL LOW (ref >90)
GLUCOSE: 125 mg/dL — AB (ref 70–99)
POTASSIUM: 4.2 meq/L (ref 3.7–5.3)
Sodium: 135 mEq/L — ABNORMAL LOW (ref 137–147)

## 2014-01-24 LAB — CBC
HCT: 37.1 % (ref 36.0–46.0)
HEMOGLOBIN: 12.2 g/dL (ref 12.0–15.0)
MCH: 26.7 pg (ref 26.0–34.0)
MCHC: 32.9 g/dL (ref 30.0–36.0)
MCV: 81.2 fL (ref 78.0–100.0)
Platelets: 159 10*3/uL (ref 150–400)
RBC: 4.57 MIL/uL (ref 3.87–5.11)
RDW: 15.2 % (ref 11.5–15.5)
WBC: 8.6 10*3/uL (ref 4.0–10.5)

## 2014-01-24 LAB — I-STAT TROPONIN, ED: Troponin i, poc: 0 ng/mL (ref 0.00–0.08)

## 2014-01-24 LAB — CBG MONITORING, ED: GLUCOSE-CAPILLARY: 125 mg/dL — AB (ref 70–99)

## 2014-01-24 MED ORDER — HYDROMORPHONE HCL 2 MG PO TABS: 1.0000 mg | ORAL_TABLET | Freq: Two times a day (BID) | ORAL | Status: DC | PRN

## 2014-01-24 MED ORDER — HYDROMORPHONE HCL PF 1 MG/ML IJ SOLN
1.0000 mg | Freq: Once | INTRAMUSCULAR | Status: AC
  Administered 2014-01-24: 1 mg via INTRAMUSCULAR
  Filled 2014-01-24: qty 1

## 2014-01-24 MED ORDER — ONDANSETRON 4 MG PO TBDP
4.0000 mg | ORAL_TABLET | Freq: Once | ORAL | Status: AC
  Administered 2014-01-24: 4 mg via ORAL
  Filled 2014-01-24: qty 1

## 2014-01-24 NOTE — Discharge Instructions (Signed)

## 2014-01-24 NOTE — ED Notes (Signed)
Phlebotomy at the bedside  

## 2014-01-24 NOTE — ED Notes (Signed)
Chest Xray at the bedside.

## 2014-01-24 NOTE — ED Provider Notes (Signed)
CSN: [id number]     Arrival date & time 01/24/14  1606 History   First MD Initiated Contact with Patient 01/24/14 1619     Chief Complaint  Patient presents with  . Chest Pain     (Consider location/radiation/quality/duration/timing/severity/associated sxs/prior Treatment) HPI Comments: Patient presents with chest pain. She has a history of a CABG in May of this year for coronary artery disease, she also had a aortic valve replacement at this time. Since that time she's been having some pain along her sternum which is felt by her cardiologist to be musculoskeletal in nature. She's been taking Dilaudid for the pain. She states she ran out of it about 3 weeks ago. She called her cardiologist's office about getting a refill on her Dilaudid however Dr. Angelena Form will not be in until Monday so they were unable to fill it for her. She complains of worsening pain to her sternum it goes under her left breast and her left shoulder. She says it's sharp in nature and crampy. It's worse with movement. She denies any shortness of breath. She denies a fevers or chills. There is no cough or chest congestion. She denies any leg pain or swelling.  Patient is a 68 y.o. female presenting with chest pain.  Chest Pain Associated symptoms: no abdominal pain, no back pain, no cough, no diaphoresis, no dizziness, no fatigue, no fever, no headache, no nausea, no numbness, no shortness of breath, not vomiting and no weakness     Past Medical History  Diagnosis Date  . Allergic rhinitis   . Diverticulitis of colon   . GERD (gastroesophageal reflux disease)   . Chronic depression   . Hypercholesterolemia     intolerance of statins and niaspan  . Apnea, sleep     mild, intolerant of cpap  . ACE-inhibitor cough   . Cataract   . Chronic headache   . Asthma, acute   . Hearing loss   . History of blood transfusion 2013  . Coronary artery disease   . Diabetes mellitus     type 2  . Shortness of breath   . Anxiety    . Hx of echocardiogram     Echo (11/2013):  Left ventricle: Mild   concentric hypertrophy. EF 55%. Wall motion was normal. Grade 1 diastolic   Dysfunction.  Aortic valve: A bioprosthesis was present and functioning normally.  Left atrium: The atrium was mildly dilated. Tricuspid valve: There was mild regurgitation.   Past Surgical History  Procedure Laterality Date  . Cholecystectomy  2001  . Colectomy      lap sigmoid  . Tonsillectomy    . Bartholin gland cyst excision    . Tubal ligation    . Bladder suspension    . Breast cyst excision  1988    bilateral nonmalignant tumors, x3  . Vaginal delivery      3  . Thyroidectomy    . Abdominal hysterectomy w/ partial vaginactomy    . Appendectomy  1964  . Colonoscopy  2014    polyps found, 2 clamped off.  . Cardiac catheterization    . Coronary angioplasty  10/29/2007    Prox RCA & Mid Cx.  . Coronary artery bypass graft N/A 10/12/2013    Procedure: CORONARY ARTERY BYPASS GRAFT TIMES TWO;  Surgeon: [name], MD;  Location: [place] [facility];  Service: Open Heart Surgery;  Laterality: N/A;  . Aortic valve replacement N/A 10/12/2013    Procedure: AORTIC VALVE REPLACEMENT (AVR);  Surgeon:  [name], MD;  Location: [place];  Service: Open Heart Surgery;  Laterality: N/A;   Family History  Problem Relation Age of Onset  . Breast cancer Mother     breast  . Hypertension Father   . Cancer Father     mesothelioma  . Asthma Father   . Stroke Paternal Grandfather   . Heart disease     History  Substance Use Topics  . Smoking status: Former Smoker -- 0.50 packs/day for 30 years    Types: Cigarettes    Quit date: 02/03/2008  . Smokeless tobacco: Never Used  . Alcohol Use: No   OB History   Grav Para Term Preterm Abortions TAB SAB Ect Mult Living   3 3        3      Obstetric Comments   1st Menstrual Cycle: 10 1st Pregnancy: 19     Review of Systems  Constitutional: Negative for fever, chills, diaphoresis and fatigue.  HENT:  Negative for congestion, rhinorrhea and sneezing.   Eyes: Negative.   Respiratory: Negative for cough, chest tightness and shortness of breath.   Cardiovascular: Positive for chest pain. Negative for leg swelling.  Gastrointestinal: Negative for nausea, vomiting, abdominal pain, diarrhea and blood in stool.  Genitourinary: Negative for frequency, hematuria, flank pain and difficulty urinating.  Musculoskeletal: Negative for arthralgias and back pain.  Skin: Negative for rash.  Neurological: Negative for dizziness, speech difficulty, weakness, numbness and headaches.      Allergies  Benadryl; Voltaren; Budesonide-formoterol fumarate; Amitriptyline; Ativan; Atorvastatin; Bupropion hcl; Caffeine; Codeine sulfate; Demerol; Gabapentin; Lisinopril; Loratadine; Meperidine hcl; Metformin; Mirtazapine; Mometasone furoate; Morphine sulfate; Olanzapine; Other; Oxycodone-acetaminophen; Pioglitazone; Propoxyphene n-acetaminophen; Rosuvastatin; Shellfish allergy; Venlafaxine; Zolpidem tartrate; and Latex  Home Medications   Prior to Admission medications   Medication Sig Start Date End Date Taking? Authorizing Provider  Acetaminophen (TYLENOL ARTHRITIS PAIN PO) Take 2 tablets by mouth 2 (two) times daily as needed (pain).   Yes Historical Provider, MD  acetaminophen (TYLENOL) 500 MG tablet Take 1,000 mg by mouth 2 (two) times daily as needed (pain).   Yes Historical Provider, MD  albuterol (PROVENTIL HFA;VENTOLIN HFA) 108 (90 BASE) MCG/ACT inhaler Inhale 2 puffs into the lungs every 6 (six) hours as needed for wheezing or shortness of breath.   Yes Historical Provider, MD  aspirin EC 325 MG tablet Take 1 tablet (325 mg total) by mouth daily. 10/08/13  Yes Minna Merritts, MD  Aspirin-Salicylamide-Caffeine (BC HEADACHE POWDER PO) Take 1 packet by mouth daily as needed (pain).   Yes Historical Provider, MD  Calcium-Vitamin D (CALTRATE 600 PLUS-VIT D PO) Take 2 tablets by mouth 2 (two) times daily.    Yes  Historical Provider, MD  FLUoxetine (PROZAC) 40 MG capsule Take 40 mg by mouth 2 (two) times daily.   Yes Historical Provider, MD  furosemide (LASIX) 40 MG tablet Take 1 tablet (40 mg total) by mouth daily as needed for fluid. 12/07/13  Yes [name], MD  HYDROmorphone (DILAUDID) 2 MG tablet Take 2 mg by mouth 2 (two) times daily as needed for severe pain.   Yes Historical Provider, MD  Insulin Lispro Prot & Lispro (HUMALOG MIX 75/25 KWIKPEN) (75-25) 100 UNIT/ML Kwikpen Inject 60 Units into the skin 2 (two) times daily.   Yes Historical Provider, MD  lansoprazole (PREVACID) 30 MG capsule Take 30 mg by mouth 2 (two) times daily before a meal.   Yes Historical Provider, MD  levothyroxine (SYNTHROID, LEVOTHROID) 150 MCG tablet  Take 150 mcg by mouth daily.     Yes Historical Provider, MD  LORazepam (ATIVAN) 0.5 MG tablet Take 0.5 mg by mouth 2 (two) times daily.    Yes Historical Provider, MD  Menthol, Topical Analgesic, (ICY HOT EX) Apply 1 application topically daily as needed (pain).   Yes Historical Provider, MD  nitroGLYCERIN (NITROLINGUAL) 0.4 MG/SPRAY spray Place 1 spray under the tongue every 5 (five) minutes x 3 doses as needed for chest pain.   Yes Historical Provider, MD  potassium chloride (K-DUR,KLOR-CON) 10 MEQ tablet Take 10 mEq by mouth daily as needed (take with furosemide dose).   Yes Historical Provider, MD  promethazine (PHENERGAN) 12.5 MG tablet Take 12.5 mg by mouth every 6 (six) hours as needed for nausea or vomiting.   Yes Historical Provider, MD  traMADol (ULTRAM) 50 MG tablet Take 50 mg by mouth 2 (two) times daily as needed.   Yes Historical Provider, MD  traZODone (DESYREL) 100 MG tablet Take 200 mg by mouth at bedtime.   Yes Historical Provider, MD  Trolamine Salicylate (ASPERCREME EX) Apply 1 application topically daily as needed (pain).   Yes Historical Provider, MD  HYDROmorphone (DILAUDID) 2 MG tablet Take 0.5 tablets (1 mg total) by mouth 2 (two) times daily as  needed for severe pain. 01/24/14   Malvin Johns, MD   BP 136/53  Pulse 65  Temp(Src) 97.5 F (36.4 C) (Oral)  Resp 14  SpO2 95% Physical Exam  Constitutional: She is oriented to person, place, and time. She appears well-developed and well-nourished.  HENT:  Head: Normocephalic and atraumatic.  Eyes: Pupils are equal, round, and reactive to light.  Neck: Normal range of motion. Neck supple.  Cardiovascular: Normal rate and regular rhythm.   Murmur heard. Pulmonary/Chest: Effort normal and breath sounds normal. No respiratory distress. She has no wheezes. She has no rales. She exhibits tenderness (positive tenderness on palpation along the sternotomy lying. She also has tenderness to the left chest wall and along the trapezius muscle. The incision appears well healing with no signs of infection.).  Abdominal: Soft. Bowel sounds are normal. There is no tenderness. There is no rebound and no guarding.  Musculoskeletal: Normal range of motion. She exhibits no edema.  No calf tenderness  Lymphadenopathy:    She has no cervical adenopathy.  Neurological: She is alert and oriented to person, place, and time.  Skin: Skin is warm and dry. No rash noted.  Psychiatric: She has a normal mood and affect.    ED Course  Procedures (including critical care time) Labs Review Results for orders placed during the hospital encounter of 01/24/14  CBC      Result Value Ref Range   WBC 8.6  4.0 - 10.5 K/uL   RBC 4.57  3.87 - 5.11 MIL/uL   Hemoglobin 12.2  12.0 - 15.0 g/dL   HCT 37.1  36.0 - 46.0 %   MCV 81.2  78.0 - 100.0 fL   MCH 26.7  26.0 - 34.0 pg   MCHC 32.9  30.0 - 36.0 g/dL   RDW 15.2  11.5 - 15.5 %   Platelets 159  150 - 400 K/uL  BASIC METABOLIC PANEL      Result Value Ref Range   Sodium 135 (*) 137 - 147 mEq/L   Potassium 4.2  3.7 - 5.3 mEq/L   Chloride 100  96 - 112 mEq/L   CO2 22  19 - 32 mEq/L   Glucose, Bld 125 (*) 70 -  99 mg/dL   BUN 19  6 - 23 mg/dL   Creatinine, Ser 0.74   0.50 - 1.10 mg/dL   Calcium 8.5  8.4 - 10.5 mg/dL   GFR calc non Af Amer 85 (*) >90 mL/min   GFR calc Af Amer >90  >90 mL/min   Anion gap 13  5 - 15  I-STAT TROPOININ, ED      Result Value Ref Range   Troponin i, poc 0.00  0.00 - 0.08 ng/mL   Comment 3           CBG MONITORING, ED      Result Value Ref Range   Glucose-Capillary 125 (*) 70 - 99 mg/dL   Dg Chest Port 1 View  01/24/2014   CLINICAL DATA:  Chest pain with radiation into left arm  EXAM: PORTABLE CHEST - 1 VIEW  COMPARISON:  November 27, 2013  FINDINGS: Lungs are clear. Heart is slightly enlarged with pulmonary vascularity within normal limits. No adenopathy. Patient is status post coronary artery bypass grafting. No adenopathy. No bone lesions.  IMPRESSION: Mild cardiac enlargement.  No edema or consolidation.   Electronically Signed   By: Lowella Grip M.D.   On: 01/24/2014 16:56     Imaging Review Dg Chest Port 1 View  01/24/2014   CLINICAL DATA:  Chest pain with radiation into left arm  EXAM: PORTABLE CHEST - 1 VIEW  COMPARISON:  November 27, 2013  FINDINGS: Lungs are clear. Heart is slightly enlarged with pulmonary vascularity within normal limits. No adenopathy. Patient is status post coronary artery bypass grafting. No adenopathy. No bone lesions.  IMPRESSION: Mild cardiac enlargement.  No edema or consolidation.   Electronically Signed   By: Lowella Grip M.D.   On: 01/24/2014 16:56     EKG Interpretation   Date/Time:  Sunday January 24 2014 16:14:25 EDT Ventricular Rate:  77 PR Interval:  204 QRS Duration: 82 QT Interval:  410 QTC Calculation: 463 R Axis:   57 Text Interpretation:  Normal sinus rhythm Normal ECG Confirmed by Leandro Berkowitz   MD, Sarabella Caprio ([id number]) on 01/24/2014 5:29:45 PM      MDM   Final diagnoses:  Chest pain, unspecified chest pain type    Patient presents with chest pain. It seems to be very reproducible on palpation. She states it's the same type of pain that she's had in the past since she's had  this CABG. She's been followed by her cardiologist she feels it is chest wall pain. Her EKG did not show any new ischemic changes. Her troponins negative. Her chest x-ray did not show any evidence of consolidation or effusions. She was feeling better after some pain medication ED. She did have one episode of vomiting after receiving the Dilaudid. She states that she's never received IM Dilaudid. I feel is likely related to that. She's since that time tolerating by mouth fluids without problem. She was discharged home in good condition and will follow up with her cardiologist this week. I gave her a prescription for a few Dilaudid pills until she can followup with her cardiologist.    Malvin Johns, MD 01/24/14 2244

## 2014-01-24 NOTE — ED Notes (Signed)
Patient sitting in the bed eating crackers and water. Patient states she hasn't vomited, but she still feels nauseous. MD notified.

## 2014-01-24 NOTE — ED Notes (Signed)
PT sitting on side of bed, fully dressed, with husband. Waiting on discharge paperwork.

## 2014-01-24 NOTE — ED Notes (Signed)
MD at the bedside  

## 2014-01-24 NOTE — ED Notes (Signed)
Presents with left sided chest pain described as sore and aching began last night while lying in bed, pain is constaant and intermittently sharp and radiates into left shoulder and left neck. Pt reports mild SOB, denies nausea. Pain is worse with movement. Pt is 4 months past CABG.

## 2014-01-24 NOTE — ED Notes (Signed)
Pt monitored by pulse ox, bp cuff, and 5-lead. 

## 2014-01-24 NOTE — ED Notes (Signed)
Pharmacy called nurse to room, pt sat up felt dizzy and then vomited x 1.  Is nauseated.

## 2014-01-24 NOTE — ED Notes (Signed)
CBG = 125. Nurse informed.

## 2014-01-24 NOTE — ED Notes (Signed)
MD Belfi at the bedside  

## 2014-01-25 NOTE — Telephone Encounter (Signed)
Thanks, cdm 

## 2014-01-25 NOTE — Telephone Encounter (Signed)
Pt was seen in ED over the weekend and given prescription.

## 2014-01-28 ENCOUNTER — Encounter: Payer: Self-pay | Admitting: Cardiovascular Disease

## 2014-01-28 ENCOUNTER — Ambulatory Visit (INDEPENDENT_AMBULATORY_CARE_PROVIDER_SITE_OTHER): Payer: Medicare Other | Admitting: Cardiovascular Disease

## 2014-01-28 VITALS — BP 142/72 | HR 72 | Ht 66.0 in | Wt 185.0 lb

## 2014-01-28 DIAGNOSIS — I2510 Atherosclerotic heart disease of native coronary artery without angina pectoris: Secondary | ICD-10-CM

## 2014-01-28 DIAGNOSIS — Z952 Presence of prosthetic heart valve: Secondary | ICD-10-CM

## 2014-01-28 DIAGNOSIS — G8918 Other acute postprocedural pain: Secondary | ICD-10-CM | POA: Diagnosis not present

## 2014-01-28 DIAGNOSIS — Z954 Presence of other heart-valve replacement: Secondary | ICD-10-CM

## 2014-01-28 DIAGNOSIS — I10 Essential (primary) hypertension: Secondary | ICD-10-CM

## 2014-01-28 DIAGNOSIS — E7800 Pure hypercholesterolemia, unspecified: Secondary | ICD-10-CM | POA: Diagnosis not present

## 2014-01-28 MED ORDER — EZETIMIBE 10 MG PO TABS
10.0000 mg | ORAL_TABLET | Freq: Every day | ORAL | Status: DC
Start: 2014-01-28 — End: 2014-02-19

## 2014-01-28 MED ORDER — HYDROMORPHONE HCL 2 MG PO TABS: 2.0000 mg | ORAL_TABLET | Freq: Two times a day (BID) | ORAL | Status: DC | PRN

## 2014-01-28 NOTE — Progress Notes (Signed)
History of Present Illness: 68 y.o. female with a hx of anemia, HTN, HLD, DM, COPD, CAD s/p DES in 10/2007 to RCA and mid CFX and CABG May 2015 as well as AVR May 2015 here today for cardiac follow up. She was admitted 10/2013 at [facility], [place] with chest pain. Echo demonstrated normal LVF, mod AI and mild AS. Myoview was negative for ischemia. She followed up with Dr. Ida Rogue in [place] with continued symptoms and was set up for [facility]. This demonstrated 3v CAD with severe prox LAD stenosis and ostial RCA. She was referred for CABG. Intraoperative TEE demonstrated mild AS and mod to severe AI. She underwent CABG with Dr. Cyndia Bent 10/12/13 (L-LAD, S-RCA) + AVR with a bioprosthetic valve (21 mm [employer]). Post op course was fairly uneventful. Post-op palpitations and seen in ED June 2015. F/u echo was ok with normal LVEF and normal AVR. She was seen in our office 12/03/13 by Richardson Dopp, PA-C and still having chest pain. Troponin was negative. D-dimer was elevated. Pt was sent to [facility] for CTA chest to exclude PE that evening. NO PE on chest CT but noted to have small left pleural effusion. I saw her here in my office 12/11/13 and she was c/o chest wall pain worsened to palpation and with deep inspiration.   She returns today for follow up. She has many complaints. She feels poorly overall. She continues to have chest wall pain, worsened to touch and with deep inspiration. Released by Dr. Cyndia Bent. She has been using narcotics for pain. She denies SOB, PND, edema or syncope. Her chest pain is clearly worsened with movement of her arms.   Primary Care Physician: Deborra Medina  Last Lipid Profile: Needs updating   Past Medical History  Diagnosis Date  . Allergic rhinitis   . Diverticulitis of colon   . GERD (gastroesophageal reflux disease)   . Chronic depression   . Hypercholesterolemia     intolerance of statins and niaspan  . Apnea, sleep     mild, intolerant of cpap  . ACE-inhibitor cough   .  Cataract   . Chronic headache   . Asthma, acute   . Hearing loss   . History of blood transfusion 2013  . Coronary artery disease   . Diabetes mellitus     type 2  . Shortness of breath   . Anxiety   . Hx of echocardiogram     Echo (11/2013):  Left ventricle: Mild   concentric hypertrophy. EF 55%. Wall motion was normal. Grade 1 diastolic   Dysfunction.  Aortic valve: A bioprosthesis was present and functioning normally.  Left atrium: The atrium was mildly dilated. Tricuspid valve: There was mild regurgitation.    Past Surgical History  Procedure Laterality Date  . Cholecystectomy  2001  . Colectomy      lap sigmoid  . Tonsillectomy    . Bartholin gland cyst excision    . Tubal ligation    . Bladder suspension    . Breast cyst excision  1988    bilateral nonmalignant tumors, x3  . Vaginal delivery      3  . Thyroidectomy    . Abdominal hysterectomy w/ partial vaginactomy    . Appendectomy  1964  . Colonoscopy  2014    polyps found, 2 clamped off.  . Cardiac catheterization    . Coronary angioplasty  10/29/2007    Prox RCA & Mid Cx.  . Coronary artery bypass graft N/A 10/12/2013  Procedure: CORONARY ARTERY BYPASS GRAFT TIMES TWO;  Surgeon: [name], MD;  Location: [place] [facility];  Service: Open Heart Surgery;  Laterality: N/A;  . Aortic valve replacement N/A 10/12/2013    Procedure: AORTIC VALVE REPLACEMENT (AVR);  Surgeon: [name], MD;  Location: [place];  Service: Open Heart Surgery;  Laterality: N/A;    Current Outpatient Prescriptions  Medication Sig Dispense Refill  . Acetaminophen (TYLENOL ARTHRITIS PAIN PO) Take 2 tablets by mouth 2 (two) times daily as needed (pain).      Marland Kitchen acetaminophen (TYLENOL) 500 MG tablet Take 1,000 mg by mouth 2 (two) times daily as needed (pain).      Marland Kitchen albuterol (PROVENTIL HFA;VENTOLIN HFA) 108 (90 BASE) MCG/ACT inhaler Inhale 2 puffs into the lungs every 6 (six) hours as needed for wheezing or shortness of breath.      Marland Kitchen aspirin EC 325  MG tablet Take 1 tablet (325 mg total) by mouth daily.  30 tablet  0  . Aspirin-Salicylamide-Caffeine (BC HEADACHE POWDER PO) Take 1 packet by mouth daily as needed (pain).      . Calcium-Vitamin D (CALTRATE 600 PLUS-VIT D PO) Take 2 tablets by mouth 2 (two) times daily.       Marland Kitchen FLUoxetine (PROZAC) 40 MG capsule Take 40 mg by mouth 2 (two) times daily.      . furosemide (LASIX) 40 MG tablet Take 1 tablet (40 mg total) by mouth daily as needed for fluid.  30 tablet  4  . HYDROmorphone (DILAUDID) 2 MG tablet Take 2 mg by mouth 2 (two) times daily as needed for severe pain.      . Insulin Lispro Prot & Lispro (HUMALOG MIX 75/25 KWIKPEN) (75-25) 100 UNIT/ML Kwikpen Inject 60 Units into the skin 2 (two) times daily.      . lansoprazole (PREVACID) 30 MG capsule Take 30 mg by mouth 2 (two) times daily before a meal.      . levothyroxine (SYNTHROID, LEVOTHROID) 150 MCG tablet Take 150 mcg by mouth daily.        Marland Kitchen LORazepam (ATIVAN) 0.5 MG tablet Take 0.5 mg by mouth 2 (two) times daily.       . Menthol, Topical Analgesic, (ICY HOT EX) Apply 1 application topically daily as needed (pain).      . nitroGLYCERIN (NITROLINGUAL) 0.4 MG/SPRAY spray Place 1 spray under the tongue every 5 (five) minutes x 3 doses as needed for chest pain.      . potassium chloride (K-DUR,KLOR-CON) 10 MEQ tablet Take 10 mEq by mouth daily as needed (take with furosemide dose).      . promethazine (PHENERGAN) 12.5 MG tablet Take 12.5 mg by mouth every 6 (six) hours as needed for nausea or vomiting.      . traMADol (ULTRAM) 50 MG tablet Take 50 mg by mouth 2 (two) times daily as needed.      . traZODone (DESYREL) 100 MG tablet Take 200 mg by mouth at bedtime.      Loura Pardon Salicylate (ASPERCREME EX) Apply 1 application topically daily as needed (pain).      . potassium chloride (K-DUR) 10 MEQ tablet        No current facility-administered medications for this visit.    Allergies  Allergen Reactions  . Benadryl  [Diphenhydramine] Shortness Of Breath  . Voltaren [Diclofenac Sodium] Shortness Of Breath  . Budesonide-Formoterol Fumarate Other (See Comments)    Shakiness, tremors  . Amitriptyline Other (See Comments)    Unknown  reaction  . Ativan [Lorazepam] Other (See Comments)    Causes double vision at highter than .5 mg dose  . Atorvastatin Other (See Comments)    Muscle aches and weakness  . Bupropion Hcl Other (See Comments)    "cloud over me" depression  . Caffeine Other (See Comments)    jitters  . Codeine Sulfate Other (See Comments)    Makes chest hurt like a heart attack  . Demerol [Meperidine] Other (See Comments)    Unknown reaction  . Gabapentin Other (See Comments)    Unknown reaction  . Lisinopril Cough  . Loratadine Other (See Comments)    Unknown reaction  . Meperidine Hcl Other (See Comments)    Unknown reaction  . Metformin Nausea And Vomiting  . Mirtazapine Other (See Comments)    Unknown reaction  . Mometasone Furoate Nausea And Vomiting  . Morphine Sulfate Other (See Comments)    Chest pain like a heart attack  . Olanzapine Other (See Comments)    Unknown reaction   . Other Other (See Comments)    Beta Blockers, reaction shortness of breath  . Oxycodone-Acetaminophen Nausea And Vomiting  . Pioglitazone Other (See Comments)    Cannot take because of risk of bladder cancer  . Propoxyphene N-Acetaminophen Nausea And Vomiting  . Rosuvastatin Other (See Comments)    Muscle aches and weakness  . Shellfish Allergy Diarrhea  . Venlafaxine Other (See Comments)    Unknown reaction  . Zolpidem Tartrate Other (See Comments)     Jittery, diarrhea  . Latex Rash    History   Social History  . Marital Status: Married    Spouse Name: N/A    Number of Children: 3  . Years of Education: N/A   Occupational History  . Retired     Quarry manager   Social History Main Topics  . Smoking status: Former Smoker -- 0.50 packs/day for 30 years    Types: Cigarettes    Quit date:  02/03/2008  . Smokeless tobacco: Never Used  . Alcohol Use: No  . Drug Use: No  . Sexual Activity: Not on file   Other Topics Concern  . Not on file   Social History Narrative  . No narrative on file    Family History  Problem Relation Age of Onset  . Breast cancer Mother     breast  . Hypertension Father   . Cancer Father     mesothelioma  . Asthma Father   . Stroke Paternal Grandfather   . Heart disease      Review of Systems:  As stated in the HPI and otherwise negative.   BP 142/72  Pulse 72  Ht 5\' 6"  (1.676 m)  Wt 185 lb (83.915 kg)  BMI 29.87 kg/m2  SpO2 96%  Physical Examination: General: Well developed, well nourished, NAD HEENT: OP clear, mucus membranes moist SKIN: warm, dry. No rashes. Neuro: No focal deficits Musculoskeletal: Muscle strength 5/5 all ext Psychiatric: Mood and affect normal Neck: No JVD, no carotid bruits, no thyromegaly, no lymphadenopathy. Lungs:Clear bilaterally, no wheezes, rhonci, crackles Cardiovascular: Regular rate and rhythm. No murmurs, gallops or rubs. Abdomen:Soft. Bowel sounds present. Non-tender.  Extremities: No lower extremity edema. Pulses are 2 + in the bilateral DP/PT.  Assessment and Plan:   1. CAD: s/p recent CABG. Stable. Continue ASA.   2. Aortic valve insufficiency: s/p AVR. Stable. Continue SBE prophylaxis.   3. Hypertension: Controlled. No changes.   4. Hyperlipidemia: She is statin intolerant. Will  try Zetia. Check lipids in 12 weeks.   5. Chest wall pain: Likely all related to healing of sternotomy. Exam does not reveal sternal instability. NO evidence of wound infection.I have had a long discussion with her today. We went back and forth for about thirty minutes discussing her pain. I have asked her to see Dr. Cyndia Bent to discuss but she is not willing to pursue follow up with CT surgery. I will write for one more month of Dilaudid to use prn for pain but is her chest wall pain persists beyond one month, she  will need to f/u with Dr. Cyndia Bent.

## 2014-01-28 NOTE — Patient Instructions (Addendum)
Your physician recommends that you schedule a follow-up appointment in:  3 months.   Your physician recommends that you return for fasting lab work in:  12 weeks--Lipid profile  Your physician has recommended you make the following change in your medication:  Start Zetia 10 mg by mouth daily

## 2014-02-01 ENCOUNTER — Telehealth: Payer: Self-pay | Admitting: *Deleted

## 2014-02-01 NOTE — Telephone Encounter (Signed)
Prior auth done. Approval for Zetia received through February 01, 2015. Case number [id number]. [employer] notified.

## 2014-02-13 DIAGNOSIS — Z23 Encounter for immunization: Secondary | ICD-10-CM | POA: Diagnosis not present

## 2014-02-19 ENCOUNTER — Other Ambulatory Visit: Payer: Self-pay

## 2014-02-19 ENCOUNTER — Telehealth: Payer: Self-pay | Admitting: Cardiovascular Disease

## 2014-02-19 DIAGNOSIS — E7800 Pure hypercholesterolemia, unspecified: Secondary | ICD-10-CM

## 2014-02-19 MED ORDER — EZETIMIBE 10 MG PO TABS: 10.0000 mg | ORAL_TABLET | Freq: Every day | ORAL | Status: DC

## 2014-02-19 NOTE — Telephone Encounter (Signed)
error 

## 2014-02-20 ENCOUNTER — Other Ambulatory Visit: Payer: Self-pay | Admitting: Family Medicine

## 2014-02-21 ENCOUNTER — Other Ambulatory Visit: Payer: Self-pay | Admitting: Family Medicine

## 2014-02-23 NOTE — Telephone Encounter (Signed)
Pt requesting medication refill. Last f/u appt 01/2014. Ok to fill per Dr Deborra Medina. Rx to be faxed to requested pharmacy before end of day.

## 2014-02-24 ENCOUNTER — Other Ambulatory Visit: Payer: Self-pay | Admitting: *Deleted

## 2014-02-24 ENCOUNTER — Encounter: Payer: Self-pay | Admitting: Surgery

## 2014-02-24 ENCOUNTER — Ambulatory Visit (INDEPENDENT_AMBULATORY_CARE_PROVIDER_SITE_OTHER): Payer: Medicare Other | Admitting: Surgery

## 2014-02-24 VITALS — BP 144/85 | HR 78 | Resp 16 | Ht 66.0 in | Wt 185.0 lb

## 2014-02-24 DIAGNOSIS — G8928 Other chronic postprocedural pain: Secondary | ICD-10-CM | POA: Diagnosis not present

## 2014-02-24 DIAGNOSIS — I2510 Atherosclerotic heart disease of native coronary artery without angina pectoris: Secondary | ICD-10-CM | POA: Diagnosis not present

## 2014-02-24 DIAGNOSIS — Z951 Presence of aortocoronary bypass graft: Secondary | ICD-10-CM | POA: Diagnosis not present

## 2014-02-24 DIAGNOSIS — G8918 Other acute postprocedural pain: Secondary | ICD-10-CM

## 2014-02-24 DIAGNOSIS — R0789 Other chest pain: Secondary | ICD-10-CM

## 2014-02-24 MED ORDER — HYDROMORPHONE HCL 2 MG PO TABS: 2.0000 mg | ORAL_TABLET | Freq: Two times a day (BID) | ORAL | Status: DC | PRN

## 2014-02-24 NOTE — Progress Notes (Signed)
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 She continues to complain of pain along the sternotomy incision and out into the left anterior chest wall, and into the left axilla and medial aspect of the upper arm. It is related to movement or upper body physical activity like lifting. It is not related to exertion. She denies any shortness of breath. She has been taking pain meds for relief, using Tylenol, Advil, and dilaudid when she has it.    Current Outpatient Prescriptions  Medication Sig Dispense Refill  . Acetaminophen (TYLENOL ARTHRITIS PAIN PO) Take 2 tablets by mouth 2 (two) times daily as needed (pain).      Marland Kitchen acetaminophen (TYLENOL) 500 MG tablet Take 1,000 mg by mouth 2 (two) times daily as needed (pain).      Marland Kitchen albuterol (PROVENTIL HFA;VENTOLIN HFA) 108 (90 BASE) MCG/ACT inhaler Inhale 2 puffs into the lungs every 6 (six) hours as needed for wheezing or shortness of breath.      Marland Kitchen aspirin EC 325 MG tablet Take 1 tablet (325 mg total) by mouth daily.  30 tablet  0  . Aspirin-Salicylamide-Caffeine (BC HEADACHE POWDER PO) Take 1 packet by mouth daily as needed (pain).      . Calcium-Vitamin D (CALTRATE 600 PLUS-VIT D PO) Take 2 tablets by mouth 2 (two) times daily.       Marland Kitchen ezetimibe (ZETIA) 10 MG tablet Take 1 tablet (10 mg total) by mouth daily.  90 tablet  3  . FLUoxetine (PROZAC) 40 MG capsule Take 40 mg by mouth 2 (two) times daily.      . furosemide (LASIX) 40 MG tablet Take 1 tablet (40 mg total) by mouth daily as needed for fluid.  30 tablet  4  . Insulin Lispro Prot & Lispro (HUMALOG MIX 75/25 KWIKPEN) (75-25) 100 UNIT/ML Kwikpen Inject 60 Units into the skin 2 (two) times daily.      . lansoprazole (PREVACID) 30 MG capsule Take 30 mg by mouth 2 (two) times daily before a meal.      . levothyroxine (SYNTHROID, LEVOTHROID) 150 MCG tablet Take 150 mcg by mouth daily.        Marland Kitchen  LORazepam (ATIVAN) 0.5 MG tablet Take 0.5 mg by mouth 2 (two) times daily.       . Menthol, Topical Analgesic, (ICY HOT EX) Apply 1 application topically daily as needed (pain).      . nitroGLYCERIN (NITROLINGUAL) 0.4 MG/SPRAY spray Place 1 spray under the tongue every 5 (five) minutes x 3 doses as needed for chest pain.      . potassium chloride (K-DUR,KLOR-CON) 10 MEQ tablet Take 10 mEq by mouth daily as needed (take with furosemide dose).      . promethazine (PHENERGAN) 12.5 MG tablet TAKE 1 TO 2 TABLETS BY MOUTH EVERY 6 HOURS AS NEEDED FOR NAUSEA & VOMITING  28 tablet  1  . traMADol (ULTRAM) 50 MG tablet TAKE ONE TABLET BY MOUTH EVERY SIX HOURS AS NEEDED  50 tablet  0  . traZODone (DESYREL) 100 MG tablet TAKE TWO TABLETS BY MOUTH NIGHTLY AT BEDTIME  60 tablet  0  . Trolamine Salicylate (ASPERCREME EX) Apply 1 application topically daily as needed (pain).      Marland Kitchen HYDROmorphone (DILAUDID) 2 MG tablet Take 1 tablet (2 mg total) by mouth 2 (two) times daily as needed for severe pain.  30 tablet  0  . potassium chloride (K-DUR) 10 MEQ tablet        No current facility-administered medications for this visit.     Physical Exam: BP 144/85  Pulse 78  Resp 16  Ht 5\' 6"  (1.676 m)  Wt 185 lb (83.915 kg)  BMI 29.87 kg/m2  SpO2 98% She looks well but anxious  The chest incision is well-healed with a thick scar. The sternum is stable. There is mild tenderness along the incision and to the left of the sternum. Lungs are clear Cardiac exam shows a regular rate and rhythm with normal valve sounds.  Diagnostic Tests:  none  Impression:  She is a little over 4 months out from surgery with persistent significant chest pain that sounds like it is related to the sternotomy incision. The sternum feels stable and the pain may be related to the sternal wires. I think the best option at this point is to remove the sternal wires through small incisions along the sternotomy scar to see if that resolves her  pain. She is 4 months out from surgery but I will obtain a CT scan of the chest to evaluate the sternum for healing before proceeding with wire removal. I discussed this option with the patient including the possibility that the pain may not resolve with wire removal and she would like to proceed.  Plan:  1. CT scan of the chest and follow up with me in 1 week.  2. I renewed her Dilaudid 2 mg po bid prn for pain, #30 because I think she is really having significant pain and the Dilaudid seems to have done the best job at controlling her pain.

## 2014-03-02 ENCOUNTER — Ambulatory Visit
Admission: RE | Admit: 2014-03-02 | Discharge: 2014-03-02 | Disposition: A | Discharge status: Home / Self Care | Payer: Medicare Other | Source: Ambulatory Visit | Attending: Surgery | Admitting: Surgery

## 2014-03-02 ENCOUNTER — Ambulatory Visit: Payer: Medicare Other | Admitting: Surgery

## 2014-03-02 DIAGNOSIS — Z951 Presence of aortocoronary bypass graft: Secondary | ICD-10-CM | POA: Diagnosis not present

## 2014-03-02 DIAGNOSIS — R0789 Other chest pain: Secondary | ICD-10-CM

## 2014-03-02 DIAGNOSIS — R0602 Shortness of breath: Secondary | ICD-10-CM | POA: Diagnosis not present

## 2014-03-02 DIAGNOSIS — IMO0002 Reserved for concepts with insufficient information to code with codable children: Secondary | ICD-10-CM | POA: Diagnosis not present

## 2014-03-02 DIAGNOSIS — S2220XA Unspecified fracture of sternum, initial encounter for closed fracture: Secondary | ICD-10-CM | POA: Diagnosis not present

## 2014-03-03 ENCOUNTER — Ambulatory Visit: Payer: Medicare Other | Admitting: Surgery

## 2014-03-04 ENCOUNTER — Encounter: Payer: Self-pay | Admitting: Surgery

## 2014-03-04 ENCOUNTER — Ambulatory Visit (INDEPENDENT_AMBULATORY_CARE_PROVIDER_SITE_OTHER): Payer: Medicare Other | Admitting: Surgery

## 2014-03-04 VITALS — BP 140/85 | HR 74 | Resp 20 | Ht 66.0 in | Wt 185.0 lb

## 2014-03-04 DIAGNOSIS — I2584 Coronary atherosclerosis due to calcified coronary lesion: Secondary | ICD-10-CM

## 2014-03-04 DIAGNOSIS — G8918 Other acute postprocedural pain: Secondary | ICD-10-CM

## 2014-03-04 DIAGNOSIS — G8928 Other chronic postprocedural pain: Secondary | ICD-10-CM | POA: Diagnosis not present

## 2014-03-04 DIAGNOSIS — I2510 Atherosclerotic heart disease of native coronary artery without angina pectoris: Secondary | ICD-10-CM

## 2014-03-04 DIAGNOSIS — I351 Nonrheumatic aortic (valve) insufficiency: Secondary | ICD-10-CM

## 2014-03-04 DIAGNOSIS — Z954 Presence of other heart-valve replacement: Secondary | ICD-10-CM

## 2014-03-04 DIAGNOSIS — Z952 Presence of prosthetic heart valve: Secondary | ICD-10-CM

## 2014-03-04 DIAGNOSIS — Z951 Presence of aortocoronary bypass graft: Secondary | ICD-10-CM

## 2014-03-04 MED ORDER — HYDROMORPHONE HCL 2 MG PO TABS: 2.0000 mg | ORAL_TABLET | Freq: Two times a day (BID) | ORAL | Status: DC | PRN

## 2014-03-04 NOTE — Progress Notes (Signed)
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 There is no signs of infection or inflammation and no other abnormality to account for her pain.   Current Outpatient Prescriptions  Medication Sig Dispense Refill  . Calcium-Vitamin D (CALTRATE 600 PLUS-VIT D PO) Take 2 tablets by mouth 2 (two) times daily.       Marland Kitchen ezetimibe (ZETIA) 10 MG tablet Take 1 tablet (10 mg total) by mouth daily.  90 tablet  3  . FLUoxetine (PROZAC) 40 MG capsule Take 40 mg by mouth 2 (two) times daily.      Marland Kitchen HYDROmorphone (DILAUDID) 2 MG tablet Take 1 tablet (2 mg total) by mouth 2 (two) times daily as needed for severe pain.  30 tablet  0  . Insulin Lispro Prot & Lispro (HUMALOG MIX 75/25 KWIKPEN) (75-25) 100 UNIT/ML Kwikpen Inject 60 Units into the skin 2 (two) times daily.      . lansoprazole (PREVACID) 30 MG capsule Take 30 mg by mouth 2 (two) times daily before a meal.      . levothyroxine (SYNTHROID, LEVOTHROID) 150 MCG tablet Take 150 mcg by mouth daily.        . traMADol (ULTRAM) 50 MG tablet TAKE ONE TABLET BY MOUTH EVERY SIX HOURS AS NEEDED  50 tablet  0  . traZODone (DESYREL) 100 MG tablet TAKE TWO TABLETS BY MOUTH NIGHTLY AT BEDTIME  60 tablet  0   No current facility-administered medications for this visit.     Physical Exam:  BP 140/85  Pulse 74  Resp 20  Ht 5\' 6"  (1.676 m)  Wt 185 lb (83.915 kg)  BMI 29.87 kg/m2  SpO2 97% She looks well but anxious  The chest incision is well-healed with a thick scar. The sternum is stable. There is mild tenderness along the incision and to the left of the sternum.  Lungs are clear  Cardiac exam shows a regular rate and rhythm with normal valve sounds.   Diagnostic Tests:  CLINICAL DATA: 68 year old female for evaluation of  sternal healing  with increased mid chest pain and shortness of breath for 5 weeks  status post CABG and valve replacement. Initial encounter.  EXAM:  CT CHEST WITHOUT CONTRAST  TECHNIQUE:  Multidetector CT imaging of the chest was performed following the  standard protocol without IV contrast.  COMPARISON: Chest radiographs 01/24/2014 and earlier. Chest CTA  11/14/2012.  FINDINGS:  Decreased body habitus since 2014. Sequelae of median sternotomy  with multiple intact sternotomy wires. 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 No superficial or retrosternal  fluid collection. The superficial wound appears well-healed.  Major airways are patent. The lungs are clear. No pericardial or  pleural effusion. Sequelae of CABG and aortic valve replacement.  Coronary artery stents. Occasional aortic calcified plaque. Tortuous  proximal common carotid arteries. Negative non contrast thoracic  inlet. No mediastinal or axillary lymphadenopathy.  Negative noncontrast visualized liver, spleen, pancreas, adrenal  glands, renal upper poles, and bowel in the upper abdomen.  Surgically absent gallbladder.  No acute osseous abnormality identified.  IMPRESSION:  1. No acute or suspicious findings at the median sternotomy site.  Some  sternal nonunion, but intact sternotomy wires with no  surrounding inflammation.  2. No acute process in the chest.  Electronically Signed  By: Lars Pinks M.D.  On: 03/02/2014 15:07   Impression:  She is almost 5 months out from surgery and I think the sternum is probably well-healed by this point even if there appears to be a partial nonunion on the CT scan. I really think that her pain will improve with removal of the wires but I told her I would wait until the 6 month mark in early to mid November. If removing the wires does not resolve her pain then she will require evaluation in a pain  management clinic. I will keep her on Dilaudid prn until then since it seems to help her pain.  Plan:  I will see her back in the first week of November.

## 2014-03-08 ENCOUNTER — Ambulatory Visit (INDEPENDENT_AMBULATORY_CARE_PROVIDER_SITE_OTHER): Payer: Medicare Other | Admitting: Internal Medicine

## 2014-03-08 ENCOUNTER — Encounter: Payer: Self-pay | Admitting: Internal Medicine

## 2014-03-08 VITALS — BP 122/78 | HR 65 | Temp 97.7°F | Wt 184.0 lb

## 2014-03-08 DIAGNOSIS — I2510 Atherosclerotic heart disease of native coronary artery without angina pectoris: Secondary | ICD-10-CM

## 2014-03-08 DIAGNOSIS — K1120 Sialoadenitis, unspecified: Secondary | ICD-10-CM | POA: Diagnosis not present

## 2014-03-08 MED ORDER — CEPHALEXIN 500 MG PO CAPS: 500.0000 mg | ORAL_CAPSULE | Freq: Four times a day (QID) | ORAL | Status: DC

## 2014-03-08 NOTE — Progress Notes (Signed)
Subjective:    Patient ID: [name], female    DOB: [birth year], 68 y.o.   MRN: [id number]  HPI  Pt presents to the clinic today with c/o salivary stones. She reports she has noticed pain and swelling in the left side of her jaw. This started about 3-4 days ago. She is having pain with eating and chewing. She denies fever, chills, nausea and vomiting. She reports that she has had this in the past. This is the 5th time this has happened in the last 15 years.  Review of Systems      Past Medical History  Diagnosis Date  . Allergic rhinitis   . Diverticulitis of colon   . GERD (gastroesophageal reflux disease)   . Chronic depression   . Hypercholesterolemia     intolerance of statins and niaspan  . Apnea, sleep     mild, intolerant of cpap  . ACE-inhibitor cough   . Cataract   . Chronic headache   . Asthma, acute   . Hearing loss   . History of blood transfusion 2013  . Coronary artery disease   . Diabetes mellitus     type 2  . Shortness of breath   . Anxiety   . Hx of echocardiogram     Echo (11/2013):  Left ventricle: Mild   concentric hypertrophy. EF 55%. Wall motion was normal. Grade 1 diastolic   Dysfunction.  Aortic valve: A bioprosthesis was present and functioning normally.  Left atrium: The atrium was mildly dilated. Tricuspid valve: There was mild regurgitation.    Current Outpatient Prescriptions  Medication Sig Dispense Refill  . Calcium-Vitamin D (CALTRATE 600 PLUS-VIT D PO) Take 2 tablets by mouth 2 (two) times daily.       Marland Kitchen ezetimibe (ZETIA) 10 MG tablet Take 1 tablet (10 mg total) by mouth daily.  90 tablet  3  . FLUoxetine (PROZAC) 40 MG capsule Take 40 mg by mouth 2 (two) times daily.      Marland Kitchen HYDROmorphone (DILAUDID) 2 MG tablet Take 1 tablet (2 mg total) by mouth 2 (two) times daily as needed for severe pain.  30 tablet  0  . Insulin Lispro Prot & Lispro (HUMALOG MIX 75/25 KWIKPEN) (75-25) 100 UNIT/ML Kwikpen Inject 60 Units into the skin 2 (two) times  daily.      . lansoprazole (PREVACID) 30 MG capsule Take 30 mg by mouth 2 (two) times daily before a meal.      . levothyroxine (SYNTHROID, LEVOTHROID) 150 MCG tablet Take 150 mcg by mouth daily.        . traMADol (ULTRAM) 50 MG tablet TAKE ONE TABLET BY MOUTH EVERY SIX HOURS AS NEEDED  50 tablet  0  . traZODone (DESYREL) 100 MG tablet TAKE TWO TABLETS BY MOUTH NIGHTLY AT BEDTIME  60 tablet  0   No current facility-administered medications for this visit.    Allergies  Allergen Reactions  . Benadryl [Diphenhydramine] Shortness Of Breath  . Voltaren [Diclofenac Sodium] Shortness Of Breath  . Budesonide-Formoterol Fumarate Other (See Comments)    Shakiness, tremors  . Amitriptyline Other (See Comments)    Unknown reaction  . Ativan [Lorazepam] Other (See Comments)    Causes double vision at highter than .5 mg dose  . Atorvastatin Other (See Comments)    Muscle aches and weakness  . Bupropion Hcl Other (See Comments)    "cloud over me" depression  . Caffeine Other (See Comments)    jitters  . Codeine  Sulfate Other (See Comments)    Makes chest hurt like a heart attack  . Demerol [Meperidine] Other (See Comments)    Unknown reaction  . Gabapentin Other (See Comments)    Unknown reaction  . Lisinopril Cough  . Loratadine Other (See Comments)    Unknown reaction  . Meperidine Hcl Other (See Comments)    Unknown reaction  . Metformin Nausea And Vomiting  . Mirtazapine Other (See Comments)    Unknown reaction  . Mometasone Furoate Nausea And Vomiting  . Morphine Sulfate Other (See Comments)    Chest pain like a heart attack  . Olanzapine Other (See Comments)    Unknown reaction   . Other Other (See Comments)    Beta Blockers, reaction shortness of breath  . Oxycodone-Acetaminophen Nausea And Vomiting  . Pioglitazone Other (See Comments)    Cannot take because of risk of bladder cancer  . Propoxyphene N-Acetaminophen Nausea And Vomiting  . Rosuvastatin Other (See Comments)      Muscle aches and weakness  . Shellfish Allergy Diarrhea  . Venlafaxine Other (See Comments)    Unknown reaction  . Zolpidem Tartrate Other (See Comments)     Jittery, diarrhea  . Latex Rash    Family History  Problem Relation Age of Onset  . Breast cancer Mother     breast  . Hypertension Father   . Cancer Father     mesothelioma  . Asthma Father   . Stroke Paternal Grandfather   . Heart disease      History   Social History  . Marital Status: Married    Spouse Name: N/A    Number of Children: 3  . Years of Education: N/A   Occupational History  . Retired     Quarry manager   Social History Main Topics  . Smoking status: Former Smoker -- 0.50 packs/day for 30 years    Types: Cigarettes    Quit date: 02/03/2008  . Smokeless tobacco: Never Used  . Alcohol Use: No  . Drug Use: No  . Sexual Activity: Not on file   Other Topics Concern  . Not on file   Social History Narrative  . No narrative on file     Constitutional: Denies fever, malaise, fatigue, headache or abrupt weight changes.  HEENT: Pt reports pain in left jaw. Denies eye pain, eye redness, ear pain, ringing in the ears, wax buildup, runny nose, nasal congestion, bloody nose, or sore throat. Respiratory: Denies difficulty breathing, shortness of breath, cough or sputum production.    No other specific complaints in a complete review of systems (except as listed in HPI above).  Objective:   Physical Exam  BP 122/78  Pulse 65  Temp(Src) 97.7 F (36.5 C) (Oral)  Wt 184 lb (83.462 kg)  SpO2 98% Wt Readings from Last 3 Encounters:  03/08/14 184 lb (83.462 kg)  03/04/14 185 lb (83.915 kg)  02/24/14 185 lb (83.915 kg)    General: Appears her stated age, obese but well developed, well nourished in NAD. Skin: Warm, dry and intact. No rashes, lesions or ulcerations noted. HEENT: Head: normal shape and size; Throat/Mouth: Dentures present, buccal mucosa pink and moist, no stones noted. Left parotid swollen  and very tender to touch. No redness or warmth noted.   Cardiovascular: Normal rate and rhythm. S1,S2 noted.  No murmur, rubs or gallops noted.  Pulmonary/Chest: Normal effort and positive vesicular breath sounds. No respiratory distress. No wheezes, rales or ronchi noted.    BMET  Component Value Date/Time   NA 135* 01/24/2014 1651   K 4.2 01/24/2014 1651   CL 100 01/24/2014 1651   CO2 22 01/24/2014 1651   GLUCOSE 125* 01/24/2014 1651   BUN 19 01/24/2014 1651   CREATININE 0.74 01/24/2014 1651   CREATININE 0.67 08/30/2011 1120   CALCIUM 8.5 01/24/2014 1651   GFRNONAA 85* 01/24/2014 1651   GFRAA >90 01/24/2014 1651    Lipid Panel     Component Value Date/Time   CHOL 246* 12/24/2008 0835   TRIG 223.0* 12/24/2008 0835   HDL 28.90* 12/24/2008 0835   CHOLHDL 9 12/24/2008 0835   VLDL 44.6* 12/24/2008 0835   LDLCALC 115* 06/23/2008 0000    CBC    Component Value Date/Time   WBC 8.6 01/24/2014 1651   RBC 4.57 01/24/2014 1651   HGB 12.2 01/24/2014 1651   HCT 37.1 01/24/2014 1651   PLT 159 01/24/2014 1651   MCV 81.2 01/24/2014 1651   MCH 26.7 01/24/2014 1651   MCHC 32.9 01/24/2014 1651   RDW 15.2 01/24/2014 1651   LYMPHSABS 2.2 12/03/2013 0959   MONOABS 0.9 12/03/2013 0959   EOSABS 0.4 12/03/2013 0959   BASOSABS 0.1 12/03/2013 0959    Hgb A1C Lab Results  Component Value Date   HGBA1C 5.9 06/19/2013         Assessment & Plan:   Parotitis, left:  No s/s of systemic infection Warm compresses TID Instructed her how to milk the gland Ibuprofen for pain relief Suck on some hard candy eRx for Keflex QID x 7 days  RTC as needed or if symptoms persist or worsen

## 2014-03-08 NOTE — Patient Instructions (Addendum)
Parotitis °Parotitis is soreness and inflammation of one or both parotid glands. The parotid glands produce saliva. They are located on each side of the face, below and in front of the earlobes. The saliva produced comes out of tiny openings (ducts) inside the cheeks. In most cases, parotitis goes away over time or with treatment. If your parotitis is caused by certain long-term (chronic) diseases, it may come back again.  °CAUSES  °Parotitis can be caused by: °· Viral infections. Mumps is one viral infection that can cause parotitis. °· Bacterial infections. °· Blockage of the salivary ducts due to a salivary stone. °· Narrowing of the salivary ducts. °· Swelling of the salivary ducts. °· Dehydration. °· Autoimmune conditions, such as sarcoidosis or Sjogren syndrome. °· Air from activities such as scuba diving, glass blowing, or playing an instrument (rare). °· Human immunodeficiency virus (HIV) or acquired immunodeficiency syndrome (AIDS). °· Tuberculosis. °SIGNS AND SYMPTOMS  °· The ears may appear to be pushed up and out from their normal position. °· Redness (erythema) of the skin over the parotid glands. °· Pain and tenderness over the parotid glands. °· Swelling in the parotid gland area. °· Yellowish-white fluid (pus) coming from the ducts inside the cheeks. °· Dry mouth. °· Bad taste in the mouth. °DIAGNOSIS  °Your health care provider may determine that you have parotitis based on your symptoms and a physical exam. A sample of fluid may also be taken from the parotid gland and tested to find the cause of your infection. X-rays or computed tomography (CT) scans may be taken if your health care provider thinks you might have a salivary stone blocking your salivary duct. °TREATMENT  °Treatment varies depending upon the cause of your parotitis. If your parotitis is caused by mumps, no treatment is needed. The condition will go away on its own after 7 to 10 days. In other cases, treatment may  include: °· Antibiotic medicine if your infection was caused by bacteria. °· Pain medicines. °· Gland massage. °· Eating sour candy to increase your saliva production. °· Removal of salivary stones. Your health care provider may flush stones out with fluids or remove them with tweezers. °· Surgery to remove the parotid glands. °HOME CARE INSTRUCTIONS  °· If you were prescribed an antibiotic medicine, finish it all even if you start to feel better. °· Put warm compresses on the sore area. °· Take medicines only as directed by your health care provider. °· Drink enough fluids to keep your urine clear or pale yellow. °SEEK IMMEDIATE MEDICAL CARE IF:  °· You have increasing pain or swelling that is not controlled with medicine. °· You have a fever. °MAKE SURE YOU: °· Understand these instructions. °· Will watch your condition. °· Will get help right away if you are not doing well or get worse. °Document Released: 11/10/2001 Document Revised: 10/05/2013 Document Reviewed: 04/16/2011 °[employer]® Patient Information ©2015 [employer]. This information is not intended to replace advice given to you by your health care provider. Make sure you discuss any questions you have with your health care provider. ° °

## 2014-03-08 NOTE — Progress Notes (Signed)
Pre visit review using our clinic review tool, if applicable. No additional management support is needed unless otherwise documented below in the visit note. 

## 2014-03-10 ENCOUNTER — Encounter: Payer: Medicare Other | Admitting: Surgery

## 2014-03-12 DIAGNOSIS — J301 Allergic rhinitis due to pollen: Secondary | ICD-10-CM | POA: Diagnosis not present

## 2014-03-15 DIAGNOSIS — J301 Allergic rhinitis due to pollen: Secondary | ICD-10-CM | POA: Diagnosis not present

## 2014-03-19 ENCOUNTER — Other Ambulatory Visit: Payer: Self-pay | Admitting: Family Medicine

## 2014-03-21 ENCOUNTER — Other Ambulatory Visit: Payer: Self-pay | Admitting: Family Medicine

## 2014-03-24 DIAGNOSIS — E119 Type 2 diabetes mellitus without complications: Secondary | ICD-10-CM | POA: Diagnosis not present

## 2014-03-24 LAB — HEMOGLOBIN A1C: Hgb A1c MFr Bld: 6.3 % — AB (ref 4.0–6.0)

## 2014-03-30 ENCOUNTER — Telehealth: Payer: Self-pay | Admitting: *Deleted

## 2014-03-30 DIAGNOSIS — G8918 Other acute postprocedural pain: Secondary | ICD-10-CM

## 2014-03-30 DIAGNOSIS — I2510 Atherosclerotic heart disease of native coronary artery without angina pectoris: Secondary | ICD-10-CM

## 2014-03-30 DIAGNOSIS — G8928 Other chronic postprocedural pain: Secondary | ICD-10-CM

## 2014-03-30 DIAGNOSIS — Z951 Presence of aortocoronary bypass graft: Secondary | ICD-10-CM

## 2014-03-30 DIAGNOSIS — I2584 Coronary atherosclerosis due to calcified coronary lesion: Secondary | ICD-10-CM

## 2014-03-30 DIAGNOSIS — Z952 Presence of prosthetic heart valve: Secondary | ICD-10-CM

## 2014-03-30 DIAGNOSIS — I351 Nonrheumatic aortic (valve) insufficiency: Secondary | ICD-10-CM

## 2014-03-30 MED ORDER — HYDROMORPHONE HCL 2 MG PO TABS: 2.0000 mg | ORAL_TABLET | Freq: Two times a day (BID) | ORAL | Status: DC | PRN

## 2014-03-30 NOTE — Telephone Encounter (Signed)
Mr. Buenaventura was called and informed that Dr. Cyndia Bent had signed a new RX for pickup at the front desk today. And he agreed.

## 2014-03-31 ENCOUNTER — Ambulatory Visit: Payer: Self-pay | Admitting: Internal Medicine

## 2014-03-31 DIAGNOSIS — I2510 Atherosclerotic heart disease of native coronary artery without angina pectoris: Secondary | ICD-10-CM | POA: Diagnosis not present

## 2014-03-31 DIAGNOSIS — R0789 Other chest pain: Secondary | ICD-10-CM | POA: Diagnosis not present

## 2014-03-31 DIAGNOSIS — Z794 Long term (current) use of insulin: Secondary | ICD-10-CM | POA: Diagnosis not present

## 2014-03-31 DIAGNOSIS — I10 Essential (primary) hypertension: Secondary | ICD-10-CM | POA: Diagnosis not present

## 2014-03-31 DIAGNOSIS — Z79899 Other long term (current) drug therapy: Secondary | ICD-10-CM | POA: Diagnosis not present

## 2014-03-31 DIAGNOSIS — E119 Type 2 diabetes mellitus without complications: Secondary | ICD-10-CM | POA: Diagnosis not present

## 2014-03-31 DIAGNOSIS — K219 Gastro-esophageal reflux disease without esophagitis: Secondary | ICD-10-CM | POA: Diagnosis not present

## 2014-03-31 DIAGNOSIS — E785 Hyperlipidemia, unspecified: Secondary | ICD-10-CM | POA: Diagnosis not present

## 2014-03-31 DIAGNOSIS — D509 Iron deficiency anemia, unspecified: Secondary | ICD-10-CM | POA: Diagnosis not present

## 2014-03-31 DIAGNOSIS — E039 Hypothyroidism, unspecified: Secondary | ICD-10-CM | POA: Diagnosis not present

## 2014-03-31 LAB — IRON AND TIBC
Iron Bind.Cap.(Total): 264 ug/dL (ref 250–450)
Iron Saturation: 16 %
Iron: 41 ug/dL — ABNORMAL LOW (ref 50–170)
UNBOUND IRON-BIND. CAP.: 223 ug/dL

## 2014-03-31 LAB — CANCER CENTER HEMOGLOBIN: HGB: 12.4 g/dL (ref 12.0–16.0)

## 2014-03-31 LAB — FERRITIN: Ferritin (ARMC): 406 ng/mL — ABNORMAL HIGH (ref 8–388)

## 2014-04-04 ENCOUNTER — Ambulatory Visit: Payer: Self-pay | Admitting: Internal Medicine

## 2014-04-05 ENCOUNTER — Encounter: Payer: Self-pay | Admitting: Internal Medicine

## 2014-04-07 ENCOUNTER — Ambulatory Visit (INDEPENDENT_AMBULATORY_CARE_PROVIDER_SITE_OTHER): Payer: Medicare Other | Admitting: Surgery

## 2014-04-07 ENCOUNTER — Other Ambulatory Visit: Payer: Self-pay | Admitting: *Deleted

## 2014-04-07 ENCOUNTER — Encounter: Payer: Self-pay | Admitting: Surgery

## 2014-04-07 VITALS — BP 121/71 | HR 75 | Ht 66.0 in | Wt 184.0 lb

## 2014-04-07 DIAGNOSIS — G8918 Other acute postprocedural pain: Secondary | ICD-10-CM | POA: Diagnosis not present

## 2014-04-07 DIAGNOSIS — I2510 Atherosclerotic heart disease of native coronary artery without angina pectoris: Secondary | ICD-10-CM | POA: Diagnosis not present

## 2014-04-07 DIAGNOSIS — Z951 Presence of aortocoronary bypass graft: Secondary | ICD-10-CM | POA: Diagnosis not present

## 2014-04-07 DIAGNOSIS — R0789 Other chest pain: Secondary | ICD-10-CM

## 2014-04-07 DIAGNOSIS — Z954 Presence of other heart-valve replacement: Secondary | ICD-10-CM | POA: Diagnosis not present

## 2014-04-07 DIAGNOSIS — Z952 Presence of prosthetic heart valve: Secondary | ICD-10-CM

## 2014-04-08 ENCOUNTER — Encounter: Payer: Self-pay | Admitting: Surgery

## 2014-04-08 NOTE — Progress Notes (Signed)
     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 There were no signs of infection or inflammation and no other abnormality to account for her pain. I felt that the best option was to remove the sternal wires to see if that would resolve her pain but felt that we should wait a full 6 months to give the sternum ample time to heal completely. She says her pain is about the same. She can localize it to the left of the sternum in one spot. She is still taking dilaudid as needed for pain.   Current Outpatient Prescriptions  Medication Sig Dispense Refill  . Calcium-Vitamin D (CALTRATE 600 PLUS-VIT D PO) Take 2 tablets by mouth 2 (two) times daily.     Marland Kitchen ezetimibe (ZETIA) 10 MG tablet Take 1 tablet (10 mg total) by mouth daily. 90 tablet 3  . FLUoxetine (PROZAC) 40 MG capsule TAKE 1 CAPSULE TWICE DAILY 180 capsule 0  . HYDROmorphone (DILAUDID) 2 MG tablet Take 1 tablet (2 mg total) by mouth 2 (two) times daily as needed for severe pain. 30 tablet 0  . Insulin Lispro Prot & Lispro (HUMALOG MIX 75/25 KWIKPEN) (75-25) 100 UNIT/ML Kwikpen Inject 60 Units into the skin 2 (two) times daily.    . lansoprazole (PREVACID) 30 MG capsule Take 30 mg by mouth 2 (two) times daily before a meal.    . levothyroxine (SYNTHROID, LEVOTHROID) 150 MCG tablet Take 150 mcg by mouth daily.      . traZODone (DESYREL) 100 MG tablet TAKE TWO TABLETS BY MOUTH NIGHTLY AT BEDTIME 60 tablet 0  . traMADol (ULTRAM) 50 MG tablet TAKE ONE TABLET BY MOUTH EVERY SIX HOURS AS NEEDED 50 tablet 0   No current facility-administered medications for this visit.     Physical Exam:  BP 121/71 mmHg  Pulse 75  Ht 5\' 6"  (1.676 m)  Wt 184 lb (83.462 kg)  BMI 29.71 kg/m2  SpO2 94% She looks well but anxious  The  chest incision is well-healed with a thick scar. The sternum is stable. There is mild tenderness along the incision and to the left of the sternum.  Lungs are clear  Cardiac exam shows a regular rate and rhythm with normal valve sounds.   Impression:  I think we should proceed with removal of the lower sternal wires in the area of her pain. She has no discomfort in the area around the manubrium so I would keep those wires in place. I discussed the procedure with her and her husband including the alternative of continued observation. We discussed the risks including but not limited to bleeding, infection, sternal instability, and continued pain. She understands and agrees to proceed.  Plan:  Sternal wire removal on Tuesday 04/13/2014

## 2014-04-09 ENCOUNTER — Other Ambulatory Visit: Payer: Self-pay

## 2014-04-09 ENCOUNTER — Encounter (HOSPITAL_COMMUNITY): Payer: Self-pay | Admitting: *Deleted

## 2014-04-09 ENCOUNTER — Other Ambulatory Visit: Payer: Self-pay | Admitting: Family Medicine

## 2014-04-09 MED ORDER — TRAMADOL HCL 50 MG PO TABS: ORAL_TABLET | ORAL | Status: DC

## 2014-04-09 NOTE — Telephone Encounter (Signed)
Rx called in as directed.   

## 2014-04-09 NOTE — Telephone Encounter (Signed)
rx called into pharmacy

## 2014-04-09 NOTE — Telephone Encounter (Signed)
Okay #50 x 0

## 2014-04-09 NOTE — Telephone Encounter (Signed)
Ok to refill 

## 2014-04-09 NOTE — Telephone Encounter (Signed)
Mr [name] left v/m; [facility] requested tramadol refill 04/07/14 and has not gotten response. Mr [name] request tramadol refill done today.Please advise.

## 2014-04-09 NOTE — Progress Notes (Signed)
Anesthesia Chart Review:  Pt is 68 year old female scheduled for sternal wires removal on 04/13/2014 with Dr. Cyndia Bent.   PMH: anemia, HTN, HLD, DM, COPD, OSA, CAD s/p DES in 10/2007 to RCA and mid CFX and CABG and AVR May 2015   Medications include: insulin, zetia  Cardiologist is Educational psychologist. Last office visit 01/28/2014.   No labs available as pt is for same day workup for procedure. CBC and CMP scheduled to be obtained DOS.   1 view Chest x-ray 01/24/2014 reviewed. Mild cardiac enlargement. No edema or consolidation.   EKG 01/24/2014: NSR.   2D echo 11/11/2013:  - Left ventricle: The cavity size was normal. There was mild concentric hypertrophy. Systolic function was normal. The estimated ejection fraction was 55%. Wall motion was normal; there were no regional wall motion abnormalities. There was an increased relative contribution of atrial contraction to ventricular filling. Doppler parameters are consistent with abnormal left ventricular relaxation (grade 1 diastolic dysfunction). - Aortic valve: A bioprosthesis was present and functioning normally. - Mitral valve: Moderately calcified annulus. Moderate thickening. - Left atrium: The atrium was mildly dilated. - Tricuspid valve: There was mild regurgitation.  Pending lab evaluation, I anticipate pt will be able to proceed as scheduled.   Willeen Cass, FNP-BC [facility]/Anesthesiology Phone: [phone] 04/09/2014 4:56 PM

## 2014-04-12 MED ORDER — DEXTROSE 5 % IV SOLN
1.5000 g | INTRAVENOUS | Status: AC
  Administered 2014-04-13: 1.5 g via INTRAVENOUS
  Filled 2014-04-12: qty 1.5

## 2014-04-13 ENCOUNTER — Ambulatory Visit (HOSPITAL_COMMUNITY): Payer: Medicare Other | Admitting: Emergency Medicine

## 2014-04-13 ENCOUNTER — Ambulatory Visit (HOSPITAL_COMMUNITY): Payer: Medicare Other

## 2014-04-13 ENCOUNTER — Ambulatory Visit (HOSPITAL_COMMUNITY)
Admission: RE | Admit: 2014-04-13 | Discharge: 2014-04-13 | Disposition: A | Discharge status: Home / Self Care | Payer: Medicare Other | Source: Ambulatory Visit | Attending: Surgery | Admitting: Surgery

## 2014-04-13 ENCOUNTER — Encounter (HOSPITAL_COMMUNITY)
Admission: RE | Disposition: A | Discharge status: Home / Self Care | Payer: Self-pay | Source: Ambulatory Visit | Attending: Surgery

## 2014-04-13 ENCOUNTER — Encounter (HOSPITAL_COMMUNITY): Payer: Self-pay | Admitting: *Deleted

## 2014-04-13 DIAGNOSIS — I2510 Atherosclerotic heart disease of native coronary artery without angina pectoris: Secondary | ICD-10-CM | POA: Diagnosis not present

## 2014-04-13 DIAGNOSIS — G4730 Sleep apnea, unspecified: Secondary | ICD-10-CM | POA: Diagnosis not present

## 2014-04-13 DIAGNOSIS — K5790 Diverticulosis of intestine, part unspecified, without perforation or abscess without bleeding: Secondary | ICD-10-CM | POA: Diagnosis not present

## 2014-04-13 DIAGNOSIS — R51 Headache: Secondary | ICD-10-CM | POA: Diagnosis not present

## 2014-04-13 DIAGNOSIS — T829XXA Unspecified complication of cardiac and vascular prosthetic device, implant and graft, initial encounter: Secondary | ICD-10-CM | POA: Diagnosis not present

## 2014-04-13 DIAGNOSIS — R079 Chest pain, unspecified: Secondary | ICD-10-CM | POA: Diagnosis not present

## 2014-04-13 DIAGNOSIS — F418 Other specified anxiety disorders: Secondary | ICD-10-CM | POA: Insufficient documentation

## 2014-04-13 DIAGNOSIS — E039 Hypothyroidism, unspecified: Secondary | ICD-10-CM | POA: Insufficient documentation

## 2014-04-13 DIAGNOSIS — K219 Gastro-esophageal reflux disease without esophagitis: Secondary | ICD-10-CM | POA: Insufficient documentation

## 2014-04-13 DIAGNOSIS — Z87891 Personal history of nicotine dependence: Secondary | ICD-10-CM | POA: Diagnosis not present

## 2014-04-13 DIAGNOSIS — E780 Pure hypercholesterolemia: Secondary | ICD-10-CM | POA: Insufficient documentation

## 2014-04-13 DIAGNOSIS — I252 Old myocardial infarction: Secondary | ICD-10-CM | POA: Insufficient documentation

## 2014-04-13 DIAGNOSIS — Z952 Presence of prosthetic heart valve: Secondary | ICD-10-CM | POA: Insufficient documentation

## 2014-04-13 DIAGNOSIS — J45909 Unspecified asthma, uncomplicated: Secondary | ICD-10-CM | POA: Insufficient documentation

## 2014-04-13 DIAGNOSIS — I10 Essential (primary) hypertension: Secondary | ICD-10-CM | POA: Insufficient documentation

## 2014-04-13 DIAGNOSIS — Z951 Presence of aortocoronary bypass graft: Secondary | ICD-10-CM | POA: Insufficient documentation

## 2014-04-13 DIAGNOSIS — G8918 Other acute postprocedural pain: Secondary | ICD-10-CM | POA: Insufficient documentation

## 2014-04-13 DIAGNOSIS — R0789 Other chest pain: Secondary | ICD-10-CM

## 2014-04-13 DIAGNOSIS — Z955 Presence of coronary angioplasty implant and graft: Secondary | ICD-10-CM | POA: Diagnosis not present

## 2014-04-13 DIAGNOSIS — J309 Allergic rhinitis, unspecified: Secondary | ICD-10-CM | POA: Diagnosis not present

## 2014-04-13 DIAGNOSIS — D649 Anemia, unspecified: Secondary | ICD-10-CM | POA: Diagnosis not present

## 2014-04-13 DIAGNOSIS — Z8673 Personal history of transient ischemic attack (TIA), and cerebral infarction without residual deficits: Secondary | ICD-10-CM | POA: Insufficient documentation

## 2014-04-13 DIAGNOSIS — E118 Type 2 diabetes mellitus with unspecified complications: Secondary | ICD-10-CM | POA: Insufficient documentation

## 2014-04-13 DIAGNOSIS — M1990 Unspecified osteoarthritis, unspecified site: Secondary | ICD-10-CM | POA: Insufficient documentation

## 2014-04-13 DIAGNOSIS — F419 Anxiety disorder, unspecified: Secondary | ICD-10-CM | POA: Diagnosis not present

## 2014-04-13 DIAGNOSIS — H9190 Unspecified hearing loss, unspecified ear: Secondary | ICD-10-CM | POA: Diagnosis not present

## 2014-04-13 DIAGNOSIS — T8189XA Other complications of procedures, not elsewhere classified, initial encounter: Secondary | ICD-10-CM | POA: Diagnosis not present

## 2014-04-13 DIAGNOSIS — Z794 Long term (current) use of insulin: Secondary | ICD-10-CM | POA: Diagnosis not present

## 2014-04-13 HISTORY — DX: Other specified postprocedural states: Z98.890

## 2014-04-13 HISTORY — PX: STERNAL WIRES REMOVAL: SHX2441

## 2014-04-13 HISTORY — DX: Nausea with vomiting, unspecified: R11.2

## 2014-04-13 HISTORY — DX: Anemia, unspecified: D64.9

## 2014-04-13 HISTORY — DX: Disorder of thyroid, unspecified: E07.9

## 2014-04-13 HISTORY — DX: Unspecified osteoarthritis, unspecified site: M19.90

## 2014-04-13 LAB — CBC
HEMATOCRIT: 39.7 % (ref 36.0–46.0)
HEMOGLOBIN: 13.4 g/dL (ref 12.0–15.0)
MCH: 28.3 pg (ref 26.0–34.0)
MCHC: 33.8 g/dL (ref 30.0–36.0)
MCV: 83.9 fL (ref 78.0–100.0)
PLATELETS: 162 10*3/uL (ref 150–400)
RBC: 4.73 MIL/uL (ref 3.87–5.11)
RDW: 13.5 % (ref 11.5–15.5)
WBC: 8.1 10*3/uL (ref 4.0–10.5)

## 2014-04-13 LAB — COMPREHENSIVE METABOLIC PANEL
ALT: 12 U/L (ref 0–35)
AST: 16 U/L (ref 0–37)
Albumin: 3.3 g/dL — ABNORMAL LOW (ref 3.5–5.2)
Alkaline Phosphatase: 106 U/L (ref 39–117)
Anion gap: 14 (ref 5–15)
BUN: 21 mg/dL (ref 6–23)
CO2: 23 meq/L (ref 19–32)
CREATININE: 0.78 mg/dL (ref 0.50–1.10)
Calcium: 9.1 mg/dL (ref 8.4–10.5)
Chloride: 103 mEq/L (ref 96–112)
GFR calc Af Amer: >90 mL/min (ref >90)
GFR calc non Af Amer: 83 mL/min — ABNORMAL LOW (ref >90)
Glucose, Bld: 113 mg/dL — ABNORMAL HIGH (ref 70–99)
Potassium: 4.4 mEq/L (ref 3.7–5.3)
Sodium: 140 mEq/L (ref 137–147)
Total Bilirubin: 0.2 mg/dL — ABNORMAL LOW (ref 0.3–1.2)
Total Protein: 7.3 g/dL (ref 6.0–8.3)

## 2014-04-13 LAB — GLUCOSE, CAPILLARY
GLUCOSE-CAPILLARY: 125 mg/dL — AB (ref 70–99)
Glucose-Capillary: 124 mg/dL — ABNORMAL HIGH (ref 70–99)

## 2014-04-13 SURGERY — REMOVAL, STERNAL WIRE
Anesthesia: General

## 2014-04-13 MED ORDER — HYDROMORPHONE HCL 1 MG/ML IJ SOLN
INTRAMUSCULAR | Status: AC
  Filled 2014-04-13: qty 1

## 2014-04-13 MED ORDER — EPHEDRINE SULFATE 50 MG/ML IJ SOLN
INTRAMUSCULAR | Status: AC
  Filled 2014-04-13: qty 1

## 2014-04-13 MED ORDER — SODIUM CHLORIDE 0.9 % IJ SOLN
INTRAMUSCULAR | Status: AC
  Filled 2014-04-13: qty 20

## 2014-04-13 MED ORDER — SUCCINYLCHOLINE CHLORIDE 20 MG/ML IJ SOLN
INTRAMUSCULAR | Status: AC
  Filled 2014-04-13: qty 1

## 2014-04-13 MED ORDER — PHENYLEPHRINE HCL 10 MG/ML IJ SOLN
INTRAMUSCULAR | Status: DC | PRN
  Administered 2014-04-13: 80 ug via INTRAVENOUS

## 2014-04-13 MED ORDER — ONDANSETRON HCL 4 MG/2ML IJ SOLN
INTRAMUSCULAR | Status: DC | PRN
  Administered 2014-04-13: 4 mg via INTRAVENOUS

## 2014-04-13 MED ORDER — ROCURONIUM BROMIDE 100 MG/10ML IV SOLN
INTRAVENOUS | Status: DC | PRN
  Administered 2014-04-13: 25 mg via INTRAVENOUS

## 2014-04-13 MED ORDER — MIDAZOLAM HCL 5 MG/5ML IJ SOLN
INTRAMUSCULAR | Status: DC | PRN
  Administered 2014-04-13: 2 mg via INTRAVENOUS

## 2014-04-13 MED ORDER — PROMETHAZINE HCL 25 MG/ML IJ SOLN
INTRAMUSCULAR | Status: AC
  Filled 2014-04-13: qty 1

## 2014-04-13 MED ORDER — ROCURONIUM BROMIDE 50 MG/5ML IV SOLN
INTRAVENOUS | Status: AC
  Filled 2014-04-13: qty 1

## 2014-04-13 MED ORDER — FENTANYL CITRATE 0.05 MG/ML IJ SOLN
INTRAMUSCULAR | Status: AC
  Filled 2014-04-13: qty 5

## 2014-04-13 MED ORDER — GLYCOPYRROLATE 0.2 MG/ML IJ SOLN
INTRAMUSCULAR | Status: DC | PRN
  Administered 2014-04-13: 0.4 mg via INTRAVENOUS

## 2014-04-13 MED ORDER — MIDAZOLAM HCL 2 MG/2ML IJ SOLN
INTRAMUSCULAR | Status: AC
  Filled 2014-04-13: qty 2

## 2014-04-13 MED ORDER — HEPARIN SODIUM (PORCINE) 1000 UNIT/ML IJ SOLN
INTRAMUSCULAR | Status: AC
  Filled 2014-04-13: qty 1

## 2014-04-13 MED ORDER — PROTAMINE SULFATE 10 MG/ML IV SOLN
INTRAVENOUS | Status: AC
  Filled 2014-04-13: qty 5

## 2014-04-13 MED ORDER — NEOSTIGMINE METHYLSULFATE 10 MG/10ML IV SOLN
INTRAVENOUS | Status: DC | PRN
  Administered 2014-04-13: 3 mg via INTRAVENOUS

## 2014-04-13 MED ORDER — HYDROMORPHONE HCL 1 MG/ML IJ SOLN
0.2500 mg | INTRAMUSCULAR | Status: AC | PRN
  Administered 2014-04-13 (×2): 0.5 mg via INTRAVENOUS
  Administered 2014-04-13 (×2): 0.25 mg via INTRAVENOUS

## 2014-04-13 MED ORDER — STERILE WATER FOR INJECTION IJ SOLN
INTRAMUSCULAR | Status: AC
  Filled 2014-04-13: qty 10

## 2014-04-13 MED ORDER — PROMETHAZINE HCL 25 MG/ML IJ SOLN
6.2500 mg | Freq: Four times a day (QID) | INTRAMUSCULAR | Status: DC | PRN
  Administered 2014-04-13: 6.25 mg via INTRAVENOUS

## 2014-04-13 MED ORDER — LIDOCAINE HCL (CARDIAC) 20 MG/ML IV SOLN
INTRAVENOUS | Status: DC | PRN
  Administered 2014-04-13: 50 mg via INTRAVENOUS

## 2014-04-13 MED ORDER — LIDOCAINE HCL (CARDIAC) 20 MG/ML IV SOLN
INTRAVENOUS | Status: AC
  Filled 2014-04-13: qty 10

## 2014-04-13 MED ORDER — THROMBIN 20000 UNITS EX SOLR
CUTANEOUS | Status: AC
  Filled 2014-04-13: qty 20000

## 2014-04-13 MED ORDER — PROPOFOL 10 MG/ML IV BOLUS
INTRAVENOUS | Status: DC | PRN
  Administered 2014-04-13: 140 mg via INTRAVENOUS
  Administered 2014-04-13: 50 mg via INTRAVENOUS
  Administered 2014-04-13: 30 mg via INTRAVENOUS

## 2014-04-13 MED ORDER — STERILE WATER FOR INJECTION IJ SOLN
INTRAMUSCULAR | Status: AC
Start: 2014-04-13 — End: 2014-04-13
  Filled 2014-04-13: qty 10

## 2014-04-13 MED ORDER — PROPOFOL 10 MG/ML IV BOLUS
INTRAVENOUS | Status: AC
  Filled 2014-04-13: qty 20

## 2014-04-13 MED ORDER — PHENYLEPHRINE 40 MCG/ML (10ML) SYRINGE FOR IV PUSH (FOR BLOOD PRESSURE SUPPORT)
PREFILLED_SYRINGE | INTRAVENOUS | Status: AC
  Filled 2014-04-13: qty 20

## 2014-04-13 MED ORDER — VECURONIUM BROMIDE 10 MG IV SOLR
INTRAVENOUS | Status: AC
  Filled 2014-04-13: qty 20

## 2014-04-13 MED ORDER — 0.9 % SODIUM CHLORIDE (POUR BTL) OPTIME
TOPICAL | Status: DC | PRN
  Administered 2014-04-13: 1000 mL

## 2014-04-13 MED ORDER — LACTATED RINGERS IV SOLN
INTRAVENOUS | Status: DC | PRN
  Administered 2014-04-13: 07:00:00 via INTRAVENOUS

## 2014-04-13 MED ORDER — HYDROMORPHONE HCL 2 MG PO TABS: 2.0000 mg | ORAL_TABLET | Freq: Four times a day (QID) | ORAL | Status: DC | PRN

## 2014-04-13 MED ORDER — FENTANYL CITRATE 0.05 MG/ML IJ SOLN
INTRAMUSCULAR | Status: DC | PRN
  Administered 2014-04-13: 50 ug via INTRAVENOUS
  Administered 2014-04-13: 100 ug via INTRAVENOUS

## 2014-04-13 MED ORDER — PROTAMINE SULFATE 10 MG/ML IV SOLN
INTRAVENOUS | Status: AC
  Filled 2014-04-13: qty 25

## 2014-04-13 MED ORDER — ONDANSETRON HCL 4 MG/2ML IJ SOLN
INTRAMUSCULAR | Status: AC
  Filled 2014-04-13: qty 2

## 2014-04-13 SURGICAL SUPPLY — 68 items
ADH SKN CLS APL DERMABOND .7 (GAUZE/BANDAGES/DRESSINGS) ×1
ATTRACTOMAT 16X20 MAGNETIC DRP (DRAPES) ×3 IMPLANT
BAG DECANTER FOR FLEXI CONT (MISCELLANEOUS) ×3 IMPLANT
BLADE SURG 10 STRL SS (BLADE) ×6 IMPLANT
BNDG GAUZE ELAST 4 BULKY (GAUZE/BANDAGES/DRESSINGS) IMPLANT
CANISTER SUCTION 2500CC (MISCELLANEOUS) ×3 IMPLANT
CATH THORACIC 28FR RT ANG (CATHETERS) IMPLANT
CATH THORACIC 36FR (CATHETERS) IMPLANT
CATH THORACIC 36FR RT ANG (CATHETERS) IMPLANT
CLIP TI MEDIUM 24 (CLIP) ×2 IMPLANT
CLIP TI WIDE RED SMALL 24 (CLIP) ×2 IMPLANT
CONT SPEC 4OZ CLIKSEAL STRL BL (MISCELLANEOUS) IMPLANT
COVER SURGICAL LIGHT HANDLE (MISCELLANEOUS) ×6 IMPLANT
DERMABOND ADVANCED (GAUZE/BANDAGES/DRESSINGS) ×2
DERMABOND ADVANCED .7 DNX12 (GAUZE/BANDAGES/DRESSINGS) IMPLANT
DRAPE LAPAROSCOPIC ABDOMINAL (DRAPES) ×3 IMPLANT
DRAPE SLUSH/WARMER DISC (DRAPES) IMPLANT
DRSG COVADERM 4X14 (GAUZE/BANDAGES/DRESSINGS) ×2 IMPLANT
ELECT REM PT RETURN 9FT ADLT (ELECTROSURGICAL) ×3
ELECTRODE REM PT RTRN 9FT ADLT (ELECTROSURGICAL) ×1 IMPLANT
GAUZE SPONGE 4X4 12PLY STRL (GAUZE/BANDAGES/DRESSINGS) ×3 IMPLANT
GAUZE XEROFORM 5X9 LF (GAUZE/BANDAGES/DRESSINGS) IMPLANT
GLOVE BIOGEL M STER SZ 6 (GLOVE) ×4 IMPLANT
GLOVE BIOGEL PI IND STRL 7.0 (GLOVE) IMPLANT
GLOVE BIOGEL PI INDICATOR 7.0 (GLOVE) ×2
GLOVE EUDERMIC 7 POWDERFREE (GLOVE) ×3 IMPLANT
GLOVE SURG SS PI 7.5 STRL IVOR (GLOVE) ×2 IMPLANT
GOWN STRL REUS W/ TWL LRG LVL3 (GOWN DISPOSABLE) ×4 IMPLANT
GOWN STRL REUS W/ TWL XL LVL3 (GOWN DISPOSABLE) ×1 IMPLANT
GOWN STRL REUS W/TWL LRG LVL3 (GOWN DISPOSABLE) ×9
GOWN STRL REUS W/TWL XL LVL3 (GOWN DISPOSABLE) ×3
HANDPIECE INTERPULSE COAX TIP (DISPOSABLE)
HEMOSTAT POWDER SURGIFOAM 1G (HEMOSTASIS) ×6 IMPLANT
KIT BASIN OR (CUSTOM PROCEDURE TRAY) ×3 IMPLANT
KIT ROOM TURNOVER OR (KITS) ×3 IMPLANT
KIT SUCTION CATH 14FR (SUCTIONS) IMPLANT
MARKER SKIN DUAL TIP RULER LAB (MISCELLANEOUS) IMPLANT
NS IRRIG 1000ML POUR BTL (IV SOLUTION) ×3 IMPLANT
PACK CHEST (CUSTOM PROCEDURE TRAY) ×3 IMPLANT
PAD ARMBOARD 7.5X6 YLW CONV (MISCELLANEOUS) ×6 IMPLANT
PIN SAFETY STERILE (MISCELLANEOUS) IMPLANT
RUBBERBAND STERILE (MISCELLANEOUS) IMPLANT
SET HNDPC FAN SPRY TIP SCT (DISPOSABLE) IMPLANT
SOLUTION BETADINE 4OZ (MISCELLANEOUS) IMPLANT
SPONGE GAUZE 4X4 12PLY STER LF (GAUZE/BANDAGES/DRESSINGS) ×2 IMPLANT
SPONGE LAP 18X18 X RAY DECT (DISPOSABLE) ×3 IMPLANT
STRAP MONTGOMERY 1.25X11-1/8 (MISCELLANEOUS) IMPLANT
SUT SILK 2 0 (SUTURE) ×3
SUT SILK 2 0 SH CR/8 (SUTURE) ×2 IMPLANT
SUT SILK 2-0 18XBRD TIE 12 (SUTURE) IMPLANT
SUT SILK 5 0 PS 2 (SUTURE) ×2 IMPLANT
SUT STEEL 6MS V (SUTURE) IMPLANT
SUT STEEL STERNAL CCS#1 18IN (SUTURE) IMPLANT
SUT STEEL SZ 6 DBL 3X14 BALL (SUTURE) IMPLANT
SUT VIC AB 1 CTX 36 (SUTURE)
SUT VIC AB 1 CTX36XBRD ANBCTR (SUTURE) IMPLANT
SUT VIC AB 2-0 CTX 36 (SUTURE) IMPLANT
SUT VIC AB 3-0 SH 27 (SUTURE) ×9
SUT VIC AB 3-0 SH 27X BRD (SUTURE) IMPLANT
SUT VIC AB 3-0 X1 27 (SUTURE) ×2 IMPLANT
SWAB COLLECTION DEVICE MRSA (MISCELLANEOUS) IMPLANT
SYSTEM SAHARA CHEST DRAIN ATS (WOUND CARE) ×3 IMPLANT
TAPE CLOTH SURG 4X10 WHT LF (GAUZE/BANDAGES/DRESSINGS) ×2 IMPLANT
TOWEL OR 17X24 6PK STRL BLUE (TOWEL DISPOSABLE) ×3 IMPLANT
TOWEL OR 17X26 10 PK STRL BLUE (TOWEL DISPOSABLE) ×3 IMPLANT
TRAY FOLEY CATH 14FRSI W/METER (CATHETERS) ×3 IMPLANT
TUBE ANAEROBIC SPECIMEN COL (MISCELLANEOUS) IMPLANT
WATER STERILE IRR 1000ML POUR (IV SOLUTION) ×3 IMPLANT

## 2014-04-13 NOTE — Anesthesia Preprocedure Evaluation (Signed)
Anesthesia Evaluation  Patient identified by MRN, date of birth, ID band Patient awake    Reviewed: Allergy & Precautions, H&P , NPO status , Patient's Chart, lab work & pertinent test results  History of Anesthesia Complications (+) PONV and history of anesthetic complications  Airway Mallampati: I  TM Distance: >3 FB Neck ROM: Full    Dental  (+) Edentulous Upper, Edentulous Lower   Pulmonary shortness of breath, asthma , sleep apnea , former smoker,          Cardiovascular hypertension, Pt. on medications + CAD, + Past MI, + Cardiac Stents and + CABG (AVR)     Neuro/Psych  Headaches, PSYCHIATRIC DISORDERS Anxiety Depression CVA, No Residual Symptoms    GI/Hepatic GERD-  ,  Endo/Other  diabetes, Type 2, Insulin DependentHypothyroidism   Renal/GU      Musculoskeletal  (+) Arthritis -,   Abdominal   Peds  Hematology   Anesthesia Other Findings   Reproductive/Obstetrics                             Anesthesia Physical Anesthesia Plan  ASA: III  Anesthesia Plan: General   Post-op Pain Management:    Induction: Intravenous  Airway Management Planned: Oral ETT  Additional Equipment:   Intra-op Plan:   Post-operative Plan: Extubation in OR  Informed Consent: I have reviewed the patients History and Physical, chart, labs and discussed the procedure including the risks, benefits and alternatives for the proposed anesthesia with the patient or authorized representative who has indicated his/her understanding and acceptance.   Dental advisory given  Plan Discussed with: Anesthesiologist and Surgeon  Anesthesia Plan Comments:         Anesthesia Quick Evaluation

## 2014-04-13 NOTE — Brief Op Note (Signed)
04/13/2014  8:52 AM  PATIENT:  Freada Bergeron  68 y.o. female  PRE-OPERATIVE DIAGNOSIS:  STERNAL PAIN  POST-OPERATIVE DIAGNOSIS:  STERNAL PAIN  PROCEDURE:  Procedure(s): STERNAL WIRES REMOVAL (N/A)  SURGEON:  Surgeon(s) and Role:    * [name], MD - Primary  PHYSICIAN ASSISTANT: none  ASSISTANTS: none   ANESTHESIA:   general  EBL:     BLOOD ADMINISTERED:none  DRAINS: none   LOCAL MEDICATIONS USED:  NONE  SPECIMEN:  No Specimen  DISPOSITION OF SPECIMEN:  N/A  COUNTS:  YES  TOURNIQUET:  * No tourniquets in log *  DICTATION: .Note written in EPIC  PLAN OF CARE: Discharge to home after PACU  PATIENT DISPOSITION:  PACU - hemodynamically stable.   Delay start of Pharmacological VTE agent (>24hrs) due to surgical blood loss or risk of bleeding: not applicable

## 2014-04-13 NOTE — Anesthesia Postprocedure Evaluation (Signed)
  Anesthesia Post-op Note  Patient: [name]  Procedure(s) Performed: Procedure(s): STERNAL WIRES REMOVAL (N/A)  Patient Location: PACU  Anesthesia Type:General  Level of Consciousness: awake and alert   Airway and Oxygen Therapy: Patient Spontanous Breathing  Post-op Pain: mild  Post-op Assessment: Post-op Vital signs reviewed, Patient's Cardiovascular Status Stable, Respiratory Function Stable, Patent Airway, NAUSEA AND VOMITING PRESENT and Pain level controlled  Post-op Vital Signs: Reviewed and stable  Last Vitals:  Filed Vitals:   04/13/14 1245  BP: 173/72  Pulse: 72  Temp:   Resp:     Complications: No apparent anesthesia complications

## 2014-04-13 NOTE — Op Note (Signed)
  CARDIOVASCULAR SURGERY OPERATIVE NOTE  04/13/2014  Surgeon:  [name], MD  First Assistant: Filiberto Pinks, RNFA   Preoperative Diagnosis:  Prolonged chest wall pain s/p AVR and CABG   Postoperative Diagnosis:  Same   Procedure:  1. Sternal wire removal   Anesthesia:  General Endotracheal   Clinical History/Surgical Indication:  The patient presents with prolonged postop pain following CABG and AVR using a pericardial valve on 10/13/2013. She continues to complain of pain along the sternotomy incision and out into the left anterior chest wall, and into the left axilla and medial aspect of the upper arm. It is related to movement or upper body physical activity like lifting. It is not related to exertion. She denies any shortness of breath. She has been taking pain meds for relief, using Tylenol, Advil, and dilaudid when she has it. I think we should proceed with removal of the lower sternal wires in the area of her pain. She has no discomfort in the area around the manubrium so I would keep those wires in place. I discussed the procedure with her and her husband including the alternative of continued observation. We discussed the risks including but not limited to bleeding, infection, sternal instability, and continued pain. She understands and agrees to proceed.  Preparation:  The patient was seen in the preoperative holding area and the correct patient, correct operation were confirmed with the patient after reviewing the medical record and catheterization. The consent was signed by me. Preoperative antibiotics were given. The patient was taken back to the operating room and positioned supine on the operating room table. After being placed under general endotracheal anesthesia by the anesthesia team the neck, chest, and abdomen were prepped with betadine soap and solution and draped in  the usual sterile manner. A surgical time-out was taken and the correct patient and operative procedure were confirmed with the nursing and anesthesia staff.   Sternal wire removal:  A 2 cm incision was made over the lower end of the sternum and electrocautery used to divide the subcutaneous tissue down to the sternum. The lower two sternal wires were palpated and the wires were cut and removed. Then a second 2 cm incision was made over the upper portion of the sternum and the upper two sternal wires were removed in a similar manner. The 3 manubrial wires were not removed. Both incisions were closed with 3-0 vicryl subcutaneous sutures and 3-0 vicryl subcuticular skin closure. Dermabond was applied followed by a dry dressing.   The patient was then awakened and transported to the PACU in good condition.

## 2014-04-13 NOTE — Anesthesia Procedure Notes (Signed)
Procedure Name: Intubation Date/Time: 04/13/2014 8:51 AM Performed by: Manuela Schwartz B Pre-anesthesia Checklist: Patient identified Patient Re-evaluated:Patient Re-evaluated prior to inductionOxygen Delivery Method: Circle system utilized Preoxygenation: Pre-oxygenation with 100% oxygen Intubation Type: IV induction Ventilation: Mask ventilation without difficulty Laryngoscope Size: Mac and 3 Grade View: Grade II Tube type: Oral Tube size: 7.0 mm Number of attempts: 2 (First look with Miller 2, unable to obtain good view by SRNA, second with MAC 3) Airway Equipment and Method: Stylet

## 2014-04-13 NOTE — Discharge Instructions (Addendum)
Resume normal activity as tolerated  May shower. Incision is covered with Dermabond surgical adhesive. Do not apply creams or lotions to incisions.   What to eat:  For your first meals, you should eat lightly; only small meals initially.  If you do not have nausea, you may eat larger meals.  Avoid spicy, greasy and heavy food.    General Anesthesia, Adult, Care After  Refer to this sheet in the next few weeks. These instructions provide you with information on caring for yourself after your procedure. Your health care provider may also give you more specific instructions. Your treatment has been planned according to current medical practices, but problems sometimes occur. Call your health care provider if you have any problems or questions after your procedure.  WHAT TO EXPECT AFTER THE PROCEDURE  After the procedure, it is typical to experience:  Sleepiness.  Nausea and vomiting. HOME CARE INSTRUCTIONS  For the first 24 hours after general anesthesia:  Have a responsible person with you.  Do not drive a car. If you are alone, do not take public transportation.  Do not drink alcohol.  Do not take medicine that has not been prescribed by your health care provider.  Do not sign important papers or make important decisions.  You may resume a normal diet and activities as directed by your health care provider.  Change bandages (dressings) as directed.  If you have questions or problems that seem related to general anesthesia, call the hospital and ask for the anesthetist or anesthesiologist on call. SEEK MEDICAL CARE IF:  You have nausea and vomiting that continue the day after anesthesia.  You develop a rash. SEEK IMMEDIATE MEDICAL CARE IF:  You have difficulty breathing.  You have chest pain.  You have any allergic problems. Document Released: 08/27/2000 Document Revised: 01/21/2013 Document Reviewed: 12/04/2012  [facility] Patient Information 2014 [place], [place].

## 2014-04-13 NOTE — H&P (Signed)
[address]       ,[place] [id number]             [phone]      Cardiothoracic Surgery History and Physical    [name] is an 68 y.o. female.    Chief Complaint: Prolonged postop sternal pain  HPI:   The patient presents with prolonged postop pain following CABG and AVR using a pericardial valve on 10/13/2013. She continues to complain of pain along the sternotomy incision and out into the left anterior chest wall, and into the left axilla and medial aspect of the upper arm. It is related to movement or upper body physical activity like lifting. It is not related to exertion. She denies any shortness of breath. She has been taking pain meds for relief, using Tylenol, Advil, and dilaudid when she has it.    Past Medical History  Diagnosis Date  . Allergic rhinitis   . Diverticulitis of colon   . GERD (gastroesophageal reflux disease)   . Chronic depression   . Hypercholesterolemia     intolerance of statins and niaspan  . Apnea, sleep     mild, intolerant of cpap  . ACE-inhibitor cough   . Cataract   . Chronic headache   . Asthma, acute   . Hearing loss   . History of blood transfusion 2013  . Coronary artery disease   . Diabetes mellitus     type 2  . Shortness of breath   . Anxiety   . Hx of echocardiogram     Echo (11/2013):  Left ventricle: Mild   concentric hypertrophy. EF 55%. Wall motion was normal. Grade 1 diastolic   Dysfunction.  Aortic valve: A bioprosthesis was present and functioning normally.  Left atrium: The atrium was mildly dilated. Tricuspid valve: There was mild regurgitation.  Marland Kitchen PONV (postoperative nausea and vomiting)   . Thyroid disease   . Arthritis   . Anemia     iron deficiency anemia    Past Surgical History  Procedure Laterality Date  . Cholecystectomy  2001  . Colectomy      lap sigmoid  . Tonsillectomy    . Bartholin gland cyst excision    . Tubal ligation    . Bladder suspension    . Breast cyst  excision  1988    bilateral nonmalignant tumors, x3  . Vaginal delivery      3  . Thyroidectomy    . Abdominal hysterectomy w/ partial vaginactomy    . Appendectomy  1964  . Colonoscopy  2014    polyps found, 2 clamped off.  . Cardiac catheterization    . Coronary angioplasty  10/29/2007    Prox RCA & Mid Cx.  . Coronary artery bypass graft N/A 10/12/2013    Procedure: CORONARY ARTERY BYPASS GRAFT TIMES TWO;  Surgeon: [name], MD;  Location: [facility];  Service: Open Heart Surgery;  Laterality: N/A;  . Aortic valve replacement N/A 10/12/2013    Procedure: AORTIC VALVE REPLACEMENT (AVR);  Surgeon: [name], MD;  Location: [facility];  Service: Open Heart Surgery;  Laterality: N/A;  . Cataract extraction w/ intraocular lens  implant, bilateral    . Abdominal hysterectomy      Family History  Problem Relation Age of Onset  . Breast cancer Mother     breast  . Hypertension Father   . Cancer Father     mesothelioma  . Asthma Father   .  Stroke Paternal Grandfather   . Heart disease     Social History:  reports that she quit smoking about 6 years ago. Her smoking use included Cigarettes. She has a 15 pack-year smoking history. She has never used smokeless tobacco. She reports that she does not drink alcohol or use illicit drugs.  Allergies:  Allergies  Allergen Reactions  . Benadryl [Diphenhydramine] Shortness Of Breath  . Voltaren [Diclofenac Sodium] Shortness Of Breath  . Budesonide-Formoterol Fumarate Other (See Comments)    Shakiness, tremors  . Amitriptyline Other (See Comments)    Unknown reaction  . Ativan [Lorazepam] Other (See Comments)    Causes double vision at highter than .5 mg dose  . Atorvastatin Other (See Comments)    Muscle aches and weakness  . Bupropion Hcl Other (See Comments)    "cloud over me" depression  . Caffeine Other (See Comments)    jitters  . Codeine Sulfate Other (See Comments)    Makes chest hurt like a heart attack  . Demerol  [Meperidine] Other (See Comments)    Unknown reaction  . Gabapentin Other (See Comments)    Unknown reaction  . Lisinopril Cough  . Loratadine Other (See Comments)    Unknown reaction  . Meperidine Hcl Other (See Comments)    Unknown reaction  . Metformin Nausea And Vomiting  . Mirtazapine Other (See Comments)    Unknown reaction  . Mometasone Furoate Nausea And Vomiting  . Morphine Sulfate Other (See Comments)    Chest pain like a heart attack  . Olanzapine Other (See Comments)    Unknown reaction   . Other Other (See Comments)    Beta Blockers, reaction shortness of breath  . Oxycodone-Acetaminophen Nausea And Vomiting  . Pioglitazone Other (See Comments)    Cannot take because of risk of bladder cancer  . Propoxyphene N-Acetaminophen Nausea And Vomiting  . Rosuvastatin Other (See Comments)    Muscle aches and weakness  . Shellfish Allergy Diarrhea  . Venlafaxine Other (See Comments)    Unknown reaction  . Zetia [Ezetimibe]     Weakness in legs, shakiness all over  . Zolpidem Tartrate Other (See Comments)     Jittery, diarrhea  . Latex Rash    Medications Prior to Admission  Medication Sig Dispense Refill  . Calcium-Vitamin D (CALTRATE 600 PLUS-VIT D PO) Take 2 tablets by mouth 2 (two) times daily.     Marland Kitchen FLUoxetine (PROZAC) 40 MG capsule TAKE 1 CAPSULE TWICE DAILY 180 capsule 0  . HYDROmorphone (DILAUDID) 2 MG tablet Take 1 tablet (2 mg total) by mouth 2 (two) times daily as needed for severe pain. 30 tablet 0  . Insulin Lispro Prot & Lispro (HUMALOG MIX 75/25 KWIKPEN) (75-25) 100 UNIT/ML Kwikpen Inject 60 Units into the skin 2 (two) times daily.    . lansoprazole (PREVACID) 30 MG capsule Take 30 mg by mouth 2 (two) times daily before a meal.    . levothyroxine (SYNTHROID, LEVOTHROID) 150 MCG tablet Take 150 mcg by mouth daily.      . traMADol (ULTRAM) 50 MG tablet TAKE ONE TABLET BY MOUTH EVERY SIX HOURS AS NEEDED 50 tablet 0  . traZODone (DESYREL) 100 MG tablet TAKE  TWO TABLETS BY MOUTH NIGHTLY AT BEDTIME 60 tablet 0  . ezetimibe (ZETIA) 10 MG tablet Take 1 tablet (10 mg total) by mouth daily. (Patient not taking: Reported on 04/12/2014) 90 tablet 3  . traMADol (ULTRAM) 50 MG tablet TAKE ONE TABLET BY MOUTH EVERY SIX HOURS  AS NEEDED (Patient not taking: Reported on 04/12/2014) 50 tablet 0    Results for orders placed or performed during the hospital encounter of 04/13/14 (from the past 48 hour(s))  Glucose, capillary     Status: Abnormal   Collection Time: 04/13/14  5:53 AM  Result Value Ref Range   Glucose-Capillary 125 (H) 70 - 99 mg/dL  CBC     Status: None   Collection Time: 04/13/14  6:55 AM  Result Value Ref Range   WBC 8.1 4.0 - 10.5 K/uL   RBC 4.73 3.87 - 5.11 MIL/uL   Hemoglobin 13.4 12.0 - 15.0 g/dL   HCT 39.7 36.0 - 46.0 %   MCV 83.9 78.0 - 100.0 fL   MCH 28.3 26.0 - 34.0 pg   MCHC 33.8 30.0 - 36.0 g/dL   RDW 13.5 11.5 - 15.5 %   Platelets 162 150 - 400 K/uL   No results found.  Review of Systems  Cardiovascular: Positive for chest pain.       Pain is to left of sternum and now to right of sternum half way down.  All other systems negative  Blood pressure 137/57, pulse 71, temperature 98.1 F (36.7 C), temperature source Oral, resp. rate 18, weight 83.462 kg (184 lb), SpO2 97 %. Physical Exam  Constitutional: She is oriented to person, place, and time. She appears well-developed and well-nourished. No distress.  HENT:  Head: Normocephalic and atraumatic.  Eyes: EOM are normal. Pupils are equal, round, and reactive to light.  Neck: Normal range of motion. Neck supple.  Cardiovascular: Normal rate, regular rhythm, normal heart sounds and intact distal pulses.   No murmur heard. Respiratory: Effort normal and breath sounds normal. No respiratory distress. She has no rales.  GI: Soft. Bowel sounds are normal. She exhibits no distension. There is no tenderness.  Musculoskeletal: She exhibits no edema.  Lymphadenopathy:    She has  no cervical adenopathy.  Neurological: She is alert and oriented to person, place, and time.  Skin: Skin is warm and dry.  Psychiatric: She has a normal mood and affect.     Assessment/Plan  I think we should proceed with removal of the lower sternal wires in the area of her pain. She has no discomfort in the area around the manubrium so I would keep those wires in place. I discussed the procedure with her and her husband including the alternative of continued observation. We discussed the risks including but not limited to bleeding, infection, sternal instability, and continued pain. She understands and agrees to proceed.  Cruz Bong K 04/13/2014, 7:35 AM

## 2014-04-13 NOTE — Transfer of Care (Signed)
Immediate Anesthesia Transfer of Care Note  Patient: [name]  Procedure(s) Performed: Procedure(s): STERNAL WIRES REMOVAL (N/A)  Patient Location: PACU  Anesthesia Type:General  Level of Consciousness: awake, alert  and oriented  Airway & Oxygen Therapy: Patient Spontanous Breathing and Patient connected to nasal cannula oxygen  Post-op Assessment: Report given to PACU RN and Post -op Vital signs reviewed and stable  Post vital signs: Reviewed and stable  Complications: No apparent anesthesia complications

## 2014-04-13 NOTE — Interval H&P Note (Signed)
History and Physical Interval Note:  04/13/2014 7:40 AM  [name]  has presented today for surgery, with the diagnosis of STERNAL PAIN  The various methods of treatment have been discussed with the patient and family. After consideration of risks, benefits and other options for treatment, the patient has consented to  Procedure(s): STERNAL WIRES REMOVAL (N/A) as a surgical intervention .  The patient's history has been reviewed, patient examined, no change in status, stable for surgery.  I have reviewed the patient's chart and labs.  Questions were answered to the patient's satisfaction.     [name]

## 2014-04-14 ENCOUNTER — Other Ambulatory Visit: Payer: Medicare Other

## 2014-04-14 ENCOUNTER — Ambulatory Visit: Payer: Medicare Other | Admitting: Cardiovascular Disease

## 2014-04-14 ENCOUNTER — Encounter (HOSPITAL_COMMUNITY): Payer: Self-pay | Admitting: Surgery

## 2014-04-19 ENCOUNTER — Other Ambulatory Visit: Payer: Self-pay | Admitting: Family Medicine

## 2014-04-21 ENCOUNTER — Encounter: Payer: Self-pay | Admitting: Surgery

## 2014-04-21 ENCOUNTER — Ambulatory Visit (INDEPENDENT_AMBULATORY_CARE_PROVIDER_SITE_OTHER): Payer: Self-pay | Admitting: Surgery

## 2014-04-21 VITALS — BP 140/74 | HR 72 | Resp 20 | Ht 66.0 in | Wt 184.0 lb

## 2014-04-21 DIAGNOSIS — Z951 Presence of aortocoronary bypass graft: Secondary | ICD-10-CM

## 2014-04-21 DIAGNOSIS — G8928 Other chronic postprocedural pain: Secondary | ICD-10-CM

## 2014-04-21 DIAGNOSIS — Z952 Presence of prosthetic heart valve: Secondary | ICD-10-CM

## 2014-04-21 DIAGNOSIS — Z954 Presence of other heart-valve replacement: Secondary | ICD-10-CM

## 2014-04-22 ENCOUNTER — Encounter: Payer: Self-pay | Admitting: Surgery

## 2014-04-22 NOTE — Progress Notes (Signed)
      HPI:  Patient returns for routine postoperative follow-up having undergone removal of her sternal wires on 04/13/2014. The patient's early postoperative recovery was notable for complete disappearance of her chest pain immediately postop. Since hospital discharge the patient reports that she has continued to feel well with no further chest pain.   Current Outpatient Prescriptions  Medication Sig Dispense Refill  . Calcium-Vitamin D (CALTRATE 600 PLUS-VIT D PO) Take 2 tablets by mouth 2 (two) times daily.     Marland Kitchen FLUoxetine (PROZAC) 40 MG capsule TAKE 1 CAPSULE TWICE DAILY 180 capsule 0  . Insulin Lispro Prot & Lispro (HUMALOG MIX 75/25 KWIKPEN) (75-25) 100 UNIT/ML Kwikpen Inject 60 Units into the skin 2 (two) times daily.    . lansoprazole (PREVACID) 30 MG capsule Take 30 mg by mouth 2 (two) times daily before a meal.    . levothyroxine (SYNTHROID, LEVOTHROID) 150 MCG tablet Take 150 mcg by mouth daily.      . traMADol (ULTRAM) 50 MG tablet TAKE ONE TABLET BY MOUTH EVERY SIX HOURS AS NEEDED 50 tablet 0  . traZODone (DESYREL) 100 MG tablet TAKE TWO TABLETS BY MOUTH NIGHTLY AT BEDTIME 60 tablet 1   No current facility-administered medications for this visit.    Physical Exam: BP 140/74 mmHg  Pulse 72  Resp 20  Ht 5\' 6"  (1.676 m)  Wt 184 lb (83.462 kg)  BMI 29.71 kg/m2  SpO2 98% She looks great and is smiling The incisions are healing well and the sternum feels stable  Diagnostic Tests: none  Impression:  She is doing well and no longer has the severe chest pain that she had preop. She is not taking any pain medicine. I told her she could return to normal activity as tolerated.  Plan:  She will continue to follow up with cardiology and will contact me if she develops any problems with her incisions.

## 2014-05-05 ENCOUNTER — Other Ambulatory Visit: Payer: Self-pay | Admitting: Family Medicine

## 2014-05-13 ENCOUNTER — Encounter (HOSPITAL_COMMUNITY): Payer: Self-pay | Admitting: Cardiovascular Disease

## 2014-05-27 ENCOUNTER — Other Ambulatory Visit: Payer: Self-pay | Admitting: Internal Medicine

## 2014-05-31 NOTE — Telephone Encounter (Signed)
04/09/14, Dr. Deborra Medina patient

## 2014-05-31 NOTE — Telephone Encounter (Signed)
Note from 04/21/2014 says she was not taking any pain medication. No mention of this in Dr. Hulen Shouts last note about taking tramadol for the pain. Was filled by Dr. Silvio Pate 04/09/14. Will defer to Dr. Deborra Medina upon her return

## 2014-06-02 ENCOUNTER — Other Ambulatory Visit: Payer: Self-pay | Admitting: Family Medicine

## 2014-06-02 NOTE — Telephone Encounter (Signed)
ERROR

## 2014-06-02 NOTE — Telephone Encounter (Signed)
Pt left vm on triage phone again requesting refill for RX.  Attempted to call pt and inform of information listed below, no answer.

## 2014-06-03 ENCOUNTER — Other Ambulatory Visit: Payer: Self-pay | Admitting: Internal Medicine

## 2014-06-03 NOTE — Telephone Encounter (Signed)
Rx called in to requested pharmacy 

## 2014-06-11 DIAGNOSIS — J301 Allergic rhinitis due to pollen: Secondary | ICD-10-CM | POA: Diagnosis not present

## 2014-06-14 DIAGNOSIS — J301 Allergic rhinitis due to pollen: Secondary | ICD-10-CM | POA: Diagnosis not present

## 2014-06-17 ENCOUNTER — Other Ambulatory Visit: Payer: Self-pay | Admitting: Family Medicine

## 2014-06-18 DIAGNOSIS — J301 Allergic rhinitis due to pollen: Secondary | ICD-10-CM | POA: Diagnosis not present

## 2014-06-28 ENCOUNTER — Other Ambulatory Visit: Payer: Self-pay | Admitting: *Deleted

## 2014-06-28 MED ORDER — FLUOXETINE HCL 40 MG PO CAPS
40.0000 mg | ORAL_CAPSULE | Freq: Two times a day (BID) | ORAL | Status: DC
Start: 2014-06-28 — End: 2014-08-20

## 2014-06-28 NOTE — Telephone Encounter (Signed)
Husband called stating that pt needed fluoxetine rx sent in to [facility] home delivery. He stated that they faxed [place] a request, but still hadn't received a response. I sent in rx electronically.

## 2014-07-07 ENCOUNTER — Other Ambulatory Visit (INDEPENDENT_AMBULATORY_CARE_PROVIDER_SITE_OTHER): Payer: Medicare Other | Admitting: *Deleted

## 2014-07-07 ENCOUNTER — Encounter: Payer: Self-pay | Admitting: Nurse Practitioner

## 2014-07-07 ENCOUNTER — Ambulatory Visit (INDEPENDENT_AMBULATORY_CARE_PROVIDER_SITE_OTHER): Payer: Medicare Other | Admitting: Nurse Practitioner

## 2014-07-07 VITALS — BP 148/82 | HR 71 | Ht 66.0 in | Wt 187.8 lb

## 2014-07-07 DIAGNOSIS — E119 Type 2 diabetes mellitus without complications: Secondary | ICD-10-CM

## 2014-07-07 DIAGNOSIS — E7800 Pure hypercholesterolemia, unspecified: Secondary | ICD-10-CM

## 2014-07-07 DIAGNOSIS — I2510 Atherosclerotic heart disease of native coronary artery without angina pectoris: Secondary | ICD-10-CM | POA: Diagnosis not present

## 2014-07-07 DIAGNOSIS — Z954 Presence of other heart-valve replacement: Secondary | ICD-10-CM | POA: Diagnosis not present

## 2014-07-07 DIAGNOSIS — Z952 Presence of prosthetic heart valve: Secondary | ICD-10-CM

## 2014-07-07 DIAGNOSIS — I10 Essential (primary) hypertension: Secondary | ICD-10-CM | POA: Diagnosis not present

## 2014-07-07 LAB — CBC
HCT: 37.4 % (ref 36.0–46.0)
Hemoglobin: 12.7 g/dL (ref 12.0–15.0)
MCHC: 33.9 g/dL (ref 30.0–36.0)
MCV: 83.8 fl (ref 78.0–100.0)
Platelets: 163 10*3/uL (ref 150.0–400.0)
RBC: 4.46 Mil/uL (ref 3.87–5.11)
RDW: 14.4 % (ref 11.5–15.5)
WBC: 7 10*3/uL (ref 4.0–10.5)

## 2014-07-07 LAB — HEPATIC FUNCTION PANEL
ALT: 11 U/L (ref 0–35)
AST: 13 U/L (ref 0–37)
Albumin: 3.7 g/dL (ref 3.5–5.2)
Alkaline Phosphatase: 101 U/L (ref 39–117)
Bilirubin, Direct: 0 mg/dL (ref 0.0–0.3)
Total Bilirubin: 0.3 mg/dL (ref 0.2–1.2)
Total Protein: 6.7 g/dL (ref 6.0–8.3)

## 2014-07-07 LAB — LIPID PANEL
Cholesterol: 220 mg/dL — ABNORMAL HIGH (ref 0–200)
HDL: 30.2 mg/dL — AB (ref >39.00)
NonHDL: 189.8
TRIGLYCERIDES: 329 mg/dL — AB (ref 0.0–149.0)
Total CHOL/HDL Ratio: 7
VLDL: 65.8 mg/dL — AB (ref 0.0–40.0)

## 2014-07-07 LAB — TSH: TSH: 1.62 u[IU]/mL (ref 0.35–4.50)

## 2014-07-07 LAB — BASIC METABOLIC PANEL
BUN: 17 mg/dL (ref 6–23)
CO2: 25 mEq/L (ref 19–32)
Calcium: 8.7 mg/dL (ref 8.4–10.5)
Chloride: 103 mEq/L (ref 96–112)
Creatinine, Ser: 0.82 mg/dL (ref 0.40–1.20)
GFR: 73.29 mL/min (ref >60.00)
Glucose, Bld: 118 mg/dL — ABNORMAL HIGH (ref 70–99)
Potassium: 4.4 mEq/L (ref 3.5–5.1)
Sodium: 138 mEq/L (ref 135–145)

## 2014-07-07 LAB — LDL CHOLESTEROL, DIRECT: Direct LDL: 127 mg/dL

## 2014-07-07 NOTE — Progress Notes (Signed)
CARDIOLOGY OFFICE NOTE  Date:  07/07/2014    [name] Date of Birth: 24-Jun-1945 Medical Record #[id number]  PCP:  Arnette Norris, MD  Cardiologist:  [facility]    Chief Complaint  Patient presents with  . Coronary Artery Disease    6 month check - seen for Dr. Angelena Form     History of Present Illness: [name] is a 69 y.o. female who presents today for a 6 month check. Seen for Dr. Angelena Form. She has a hx of anemia (on iron infusions), HTN, HLD, DM, COPD, CAD s/p DES in 10/2007 to RCA and mid CFX and subsequent CABG May 2015 as well as AVR in May 2015. She was admitted 10/2013 at [facility] with chest pain. Echo demonstrated normal LVF, mod AI and mild AS. Myoview was negative for ischemia. She followed up with Dr. Ida Rogue in [place] with continued symptoms and was set up for [facility]. This demonstrated 3v CAD with severe prox LAD stenosis and ostial RCA. She was referred for CABG. Intraoperative TEE demonstrated mild AS and mod to severe AI. She underwent CABG with Dr. Cyndia Bent 10/12/13 (L-LAD, S-RCA) + AVR with a bioprosthetic valve (21 mm [employer]). Post op course was fairly uneventful except for persistent chest pain. Post-op palpitations and seen in ED June 2015. F/u echo was ok with normal LVEF and normal AVR. She was seen in our office 12/03/13 by Richardson Dopp, PA-C and still having chest pain. Troponin was negative. D-dimer was elevated. Pt was sent to [facility] for CTA chest to exclude PE that evening. NO PE on chest CT but noted to have small left pleural effusion.   She was last seen in August - was doing rather poorly with lot of somatic complaints. Cardiac status was felt to be stable. She was continuing to have lots of chest wall pain - went back to Dr. Cyndia Bent - ended up having her sternal wires removed back in November.   Comes back today. Here with her husband. She is hard of hearing. She has multiple allergies. Really not on any cardiac medicines. Using a wrist BP cuff at  home - ?accurate. Gaining weight. Very sedentary due to her decreased hearing and not being able to drive. No more chest pain. Breathing is ok. ? How much salt she gets - they do eat take out. About 2 to 3 x per day, she will feel her face get hot, then gets sweaty and her arms will be hot. She is not sure what this is. Says her sugar does ok if she eats right. She has had a URI - slow to resolve. She is due to have an iron infusion soon and will be seeing her endocrinologist soon as well.   Past Medical History  Diagnosis Date  . Allergic rhinitis   . Diverticulitis of colon   . GERD (gastroesophageal reflux disease)   . Chronic depression   . Hypercholesterolemia     intolerance of statins and niaspan  . Apnea, sleep     mild, intolerant of cpap  . ACE-inhibitor cough   . Cataract   . Chronic headache   . Asthma, acute   . Hearing loss   . History of blood transfusion 2013  . Coronary artery disease   . Diabetes mellitus     type 2  . Shortness of breath   . Anxiety   . Hx of echocardiogram     Echo (11/2013):  Left ventricle: Mild  concentric hypertrophy. EF 55%. Wall motion was normal. Grade 1 diastolic   Dysfunction.  Aortic valve: A bioprosthesis was present and functioning normally.  Left atrium: The atrium was mildly dilated. Tricuspid valve: There was mild regurgitation.  Marland Kitchen PONV (postoperative nausea and vomiting)   . Thyroid disease   . Arthritis   . Anemia     iron deficiency anemia    Past Surgical History  Procedure Laterality Date  . Cholecystectomy  2001  . Colectomy      lap sigmoid  . Tonsillectomy    . Bartholin gland cyst excision    . Tubal ligation    . Bladder suspension    . Breast cyst excision  1988    bilateral nonmalignant tumors, x3  . Vaginal delivery      3  . Thyroidectomy    . Abdominal hysterectomy w/ partial vaginactomy    . Appendectomy  1964  . Colonoscopy  2014    polyps found, 2 clamped off.  . Cardiac catheterization    .  Coronary angioplasty  10/29/2007    Prox RCA & Mid Cx.  . Coronary artery bypass graft N/A 10/12/2013    Procedure: CORONARY ARTERY BYPASS GRAFT TIMES TWO;  Surgeon: [name], MD;  Location: [place] OR;  Service: Open Heart Surgery;  Laterality: N/A;  . Aortic valve replacement N/A 10/12/2013    Procedure: AORTIC VALVE REPLACEMENT (AVR);  Surgeon: [name], MD;  Location: [place];  Service: Open Heart Surgery;  Laterality: N/A;  . Cataract extraction w/ intraocular lens  implant, bilateral    . Abdominal hysterectomy    . Sternal wires removal N/A 04/13/2014    Procedure: STERNAL WIRES REMOVAL;  Surgeon: [name], MD;  Location: [facility] OR;  Service: Thoracic;  Laterality: N/A;  . Left heart catheterization with coronary angiogram N/A 10/09/2013    Procedure: LEFT HEART CATHETERIZATION WITH CORONARY ANGIOGRAM;  Surgeon: [name], MD;  Location: [facility] CATH LAB;  Service: Cardiovascular;  Laterality: N/A;     Medications: Current Outpatient Prescriptions  Medication Sig Dispense Refill  . Calcium-Vitamin D (CALTRATE 600 PLUS-VIT D PO) Take 2 tablets by mouth 2 (two) times daily.     Marland Kitchen FLUoxetine (PROZAC) 40 MG capsule Take 1 capsule (40 mg total) by mouth 2 (two) times daily. 180 capsule 0  . Insulin Lispro Prot & Lispro (HUMALOG MIX 75/25 KWIKPEN) (75-25) 100 UNIT/ML Kwikpen Inject 60 Units into the skin 2 (two) times daily.    . lansoprazole (PREVACID) 30 MG capsule Take 30 mg by mouth 2 (two) times daily before a meal.    . levothyroxine (SYNTHROID, LEVOTHROID) 150 MCG tablet Take 150 mcg by mouth daily.      . promethazine (PHENERGAN) 12.5 MG tablet TAKE 1 TO 2 TABLETS BY MOUTH EVERY 6 HOURS AS NEEDED FOR NASUEA & VOMITING 28 tablet 0  . traMADol (ULTRAM) 50 MG tablet TAKE ONE TABLET BY MOUTH EVERY SIX HOURS AS NEEDED 50 tablet 0  . traZODone (DESYREL) 100 MG tablet TAKE TWO TABLETS BY MOUTH NIGHTLY AT BEDTIME 60 tablet 1   No current facility-administered medications for  this visit.    Allergies: Allergies  Allergen Reactions  . Benadryl [Diphenhydramine] Shortness Of Breath  . Voltaren [Diclofenac Sodium] Shortness Of Breath  . Budesonide-Formoterol Fumarate Other (See Comments)    Shakiness, tremors  . Amitriptyline Other (See Comments)    Unknown reaction  . Ativan [Lorazepam] Other (See Comments)    Causes double  vision at highter than .5 mg dose  . Atorvastatin Other (See Comments)    Muscle aches and weakness  . Bupropion Hcl Other (See Comments)    "cloud over me" depression  . Caffeine Other (See Comments)    jitters  . Codeine Sulfate Other (See Comments)    Makes chest hurt like a heart attack  . Demerol [Meperidine] Other (See Comments)    Unknown reaction  . Gabapentin Other (See Comments)    Unknown reaction  . Lisinopril Cough  . Loratadine Other (See Comments)    Unknown reaction  . Meperidine Hcl Other (See Comments)    Unknown reaction  . Metformin Nausea And Vomiting  . Mirtazapine Other (See Comments)    Unknown reaction  . Mometasone Furoate Nausea And Vomiting  . Morphine Sulfate Other (See Comments)    Chest pain like a heart attack  . Olanzapine Other (See Comments)    Unknown reaction   . Other Other (See Comments)    Beta Blockers, reaction shortness of breath  . Oxycodone-Acetaminophen Nausea And Vomiting  . Pioglitazone Other (See Comments)    Cannot take because of risk of bladder cancer  . Propoxyphene N-Acetaminophen Nausea And Vomiting  . Rosuvastatin Other (See Comments)    Muscle aches and weakness  . Shellfish Allergy Diarrhea  . Venlafaxine Other (See Comments)    Unknown reaction  . Zetia [Ezetimibe]     Weakness in legs, shakiness all over  . Zolpidem Tartrate Other (See Comments)     Jittery, diarrhea  . Latex Rash    Social History: The patient  reports that she quit smoking about 6 years ago. Her smoking use included Cigarettes. She has a 15 pack-year smoking history. She has never  used smokeless tobacco. She reports that she does not drink alcohol or use illicit drugs.   Family History: The patient's family history includes Asthma in her father; Breast cancer in her mother; Cancer in her father; Heart disease in an other family member; Hypertension in her father; Stroke in her paternal grandfather.   Review of Systems: Please see the history of present illness.   Otherwise, the review of systems is positive for hearing loss, cough, excessive sweating and wheezing.   All other systems are reviewed and negative.   Physical Exam: VS:  BP 148/82 mmHg  Pulse 71  Ht 5\' 6"  (1.676 m)  Wt 187 lb 12.8 oz (85.186 kg)  BMI 30.33 kg/m2 .  BMI Body mass index is 30.33 kg/(m^2).  Wt Readings from Last 3 Encounters:  07/07/14 187 lb 12.8 oz (85.186 kg)  04/21/14 184 lb (83.462 kg)  04/13/14 184 lb (83.462 kg)    General: Pleasant. Well developed, well nourished and in no acute distress. She is very hard of hearing. HEENT: Normal. Neck: Supple, no JVD, carotid bruits, or masses noted.  Cardiac: Regular rate and rhythm. Soft outflow murmur noted. No edema.  Respiratory:  Lungs are clear to auscultation bilaterally with normal work of breathing. No wheezing that I can appreciate.  GI: Soft and nontender.  MS: No deformity or atrophy. Gait and ROM intact. Skin: Warm and dry. Color is normal.  Neuro:  Strength and sensation are intact and no gross focal deficits noted.  Psych: Alert, appropriate and with normal affect.   LABORATORY DATA:  EKG:  EKG is not ordered today.   Lab Results  Component Value Date   WBC 8.1 04/13/2014   HGB 13.4 04/13/2014   HCT 39.7 04/13/2014  PLT 162 04/13/2014   GLUCOSE 113* 04/13/2014   CHOL 246* 12/24/2008   TRIG 223.0* 12/24/2008   HDL 28.90* 12/24/2008   LDLDIRECT 172.6 12/24/2008   LDLCALC 115* 06/23/2008   ALT 12 04/13/2014   AST 16 04/13/2014   NA 140 04/13/2014   K 4.4 04/13/2014   CL 103 04/13/2014   CREATININE 0.78  04/13/2014   BUN 21 04/13/2014   CO2 23 04/13/2014   TSH 1.539 09/11/2013   INR 1.40 10/12/2013   HGBA1C 5.9 06/19/2013   MICROALBUR 0.2 12/24/2008    BNP (last 3 results) No results for input(s): BNP in the last 8760 hours.  ProBNP (last 3 results)  Recent Labs  10/08/13 2045  PROBNP 225.2*     Other Studies Reviewed Today:   Echo Study Conclusions from 11/2103  - Left ventricle: The cavity size was normal. There was mild concentric hypertrophy. Systolic function was normal. The estimated ejection fraction was 55%. Wall motion was normal; there were no regional wall motion abnormalities. There was an increased relative contribution of atrial contraction to ventricular filling. Doppler parameters are consistent with abnormal left ventricular relaxation (grade 1 diastolic dysfunction). - Aortic valve: A bioprosthesis was present and functioning normally. - Mitral valve: Moderately calcified annulus. Moderate thickening. - Left atrium: The atrium was mildly dilated. - Tricuspid valve: There was mild regurgitation.   Assessment/Plan: 1. CAD: s/p recent CABG back in May of 2015. Stable. Continue ASA.   2. Aortic valve insufficiency: s/p AVR. Stable. Continue SBE prophylaxis. Echo post AVR satisfactory.   3. Hypertension: Recheck by me is 140/90 in both arms. I suspect she uses too much salt. She has gained weight. She has multiple allergies - would favor trying to manage with lifestyle changes.   4. Hyperlipidemia: She is statin intolerant.   5. Hot spells - not sure what to make of this - checking labs today. Needs to be watching her blood sugars and her BP.  6. Obesity - discussed at length - given her multiple intolerances, I tried to stress to her that she will have to do this "the very hard way" with diet and exercise. She has access to a stationary bike - have tried to encourage her to use this daily.        Current medicines are reviewed  with the patient today.  The patient does not have concerns regarding medicines.  The following changes have been made:  See above.  Labs/ tests ordered today include:    Orders Placed This Encounter  Procedures  . Basic metabolic panel  . CBC  . TSH  . Hepatic function panel     Disposition:   FU with Dr. Angelena Form in 6 months  Patient is agreeable to this plan and will call if any problems develop in the interim.   Signed: Burtis Junes, RN, ANP-C 07/07/2014 9:07 AM  [facility] [address] [place], [place]  [address] Phone: [phone] Fax: [phone]

## 2014-07-07 NOTE — Patient Instructions (Signed)
We will be checking the following labs today CBC, BMET, HPF and TSH along with your lipids that were already done.   Start riding your exercise bike every day - work towards 30 to 45 minutes a day  See Dr. Angelena Form in 6 months  Call the [place] office at [phone] if you have any questions, problems or concerns.

## 2014-07-08 ENCOUNTER — Ambulatory Visit: Payer: Medicare Other | Admitting: General Surgery

## 2014-07-12 ENCOUNTER — Other Ambulatory Visit: Payer: Self-pay | Admitting: *Deleted

## 2014-07-12 DIAGNOSIS — E785 Hyperlipidemia, unspecified: Secondary | ICD-10-CM

## 2014-07-13 ENCOUNTER — Ambulatory Visit (INDEPENDENT_AMBULATORY_CARE_PROVIDER_SITE_OTHER): Payer: Medicare Other | Admitting: Pharmacist

## 2014-07-13 DIAGNOSIS — E7800 Pure hypercholesterolemia, unspecified: Secondary | ICD-10-CM

## 2014-07-13 DIAGNOSIS — I2510 Atherosclerotic heart disease of native coronary artery without angina pectoris: Secondary | ICD-10-CM

## 2014-07-13 MED ORDER — PRAVASTATIN SODIUM 20 MG PO TABS: 20.0000 mg | ORAL_TABLET | Freq: Every evening | ORAL | Status: DC

## 2014-07-13 NOTE — Progress Notes (Signed)
[name] is a pleasant 69 yo F referred by Dr. Angelena Form for lipid management. PCP is Dr. Deborra Medina. Her PMH is significant for CAD s/p stenting x 2, CABG x 2 (2015), AVR (2015), HTN, HLD, DMII, CVA 2014.  She has attempted use with atorvastatin, Crestor and Zetia which she was not able to tolerate due to muscle aches and weakness. Currently she is not on any antilipemic agents.   FH: Son -- CABG at 68 yo, paternal grandfather - stroke  SH: Quit smoking after CABG in 2015, does not drink alcohol   Diet:    Breakfast: biscuit, eggs, bacon    Lunch: sandwich chicken + pimento cheese, peanut butter  + jelly    Dinner: baked chicken, cabbage, broccoli, [employer]    fried fish       Drinks only [place]  Exercise: Recently began using stationary bike x 5 mins most days - has not been able to increase due to arm/leg pain  Labs: 07/07/14: tchol 220, TG 329, HDL 30, LDL-direct 127 (no meds)  Allergies  Allergen Reactions  . Benadryl [Diphenhydramine] Shortness Of Breath  . Voltaren [Diclofenac Sodium] Shortness Of Breath  . Budesonide-Formoterol Fumarate Other (See Comments)    Shakiness, tremors  . Amitriptyline Other (See Comments)    Unknown reaction  . Ativan [Lorazepam] Other (See Comments)    Causes double vision at highter than .5 mg dose  . Atorvastatin Other (See Comments)    Muscle aches and weakness  . Bupropion Hcl Other (See Comments)    "cloud over me" depression  . Caffeine Other (See Comments)    jitters  . Codeine Sulfate Other (See Comments)    Makes chest hurt like a heart attack  . Demerol [Meperidine] Other (See Comments)    Unknown reaction  . Gabapentin Other (See Comments)    Unknown reaction  . Lisinopril Cough  . Loratadine Other (See Comments)    Unknown reaction  . Meperidine Hcl Other (See Comments)    Unknown reaction  . Metformin Nausea And Vomiting  . Mirtazapine Other (See Comments)    Unknown reaction  . Mometasone Furoate Nausea And Vomiting  .  Morphine Sulfate Other (See Comments)    Chest pain like a heart attack  . Olanzapine Other (See Comments)    Unknown reaction   . Other Other (See Comments)    Beta Blockers, reaction shortness of breath  . Oxycodone-Acetaminophen Nausea And Vomiting  . Pioglitazone Other (See Comments)    Cannot take because of risk of bladder cancer  . Propoxyphene N-Acetaminophen Nausea And Vomiting  . Rosuvastatin Other (See Comments)    Muscle aches and weakness  . Shellfish Allergy Diarrhea  . Venlafaxine Other (See Comments)    Unknown reaction  . Zetia [Ezetimibe]     Weakness in legs, shakiness all over  . Zolpidem Tartrate Other (See Comments)     Jittery, diarrhea  . Latex Rash   Current Outpatient Prescriptions  Medication Sig Dispense Refill  . Calcium-Vitamin D (CALTRATE 600 PLUS-VIT D PO) Take 2 tablets by mouth 2 (two) times daily.     Marland Kitchen FLUoxetine (PROZAC) 40 MG capsule Take 1 capsule (40 mg total) by mouth 2 (two) times daily. 180 capsule 0  . Insulin Lispro Prot & Lispro (HUMALOG MIX 75/25 KWIKPEN) (75-25) 100 UNIT/ML Kwikpen Inject 60 Units into the skin 2 (two) times daily.    . lansoprazole (PREVACID) 30 MG capsule Take 30 mg by mouth 2 (two) times daily before  a meal.    . levothyroxine (SYNTHROID, LEVOTHROID) 150 MCG tablet Take 150 mcg by mouth daily.      . pravastatin (PRAVACHOL) 20 MG tablet Take 1 tablet (20 mg total) by mouth every evening. 90 tablet 3  . promethazine (PHENERGAN) 12.5 MG tablet TAKE 1 TO 2 TABLETS BY MOUTH EVERY 6 HOURS AS NEEDED FOR NASUEA & VOMITING 28 tablet 0  . traMADol (ULTRAM) 50 MG tablet TAKE ONE TABLET BY MOUTH EVERY SIX HOURS AS NEEDED 50 tablet 0  . traZODone (DESYREL) 100 MG tablet TAKE TWO TABLETS BY MOUTH NIGHTLY AT BEDTIME 60 tablet 1   No current facility-administered medications for this visit.    A/P:  Based on [name]' most recent lipid panel, she is currently above her LDL goal < 70 and non-HDL goal of < 100. With the  intolerances she has had in the past using atorvastatin, Crestor and Zetia, we discussed attempting treatment with low dose pravastatin 20 mg daily due to its lower risk of myopathies and lower cost. We also discussed better diet habits including increasing her intake of lean proteins, vegetables and fiber. Exercise has been limited by her arthritis but she was praised for the exercise she has been able to tolerate recently and was encouraged to slowly increase as tolerated. We will reassess a fasting lipid panel in 2-3 months and see her again in clinic a few days afterward. If she has tolerated the low dose pravastatin, we will attempt to increase the dose at that time. If not, will re-consider use of a PCSK9 inhibitor depending on her ability to afford the medication.   [name], PharmD Clinical Pharmacist - Resident Pager: [phone] Pharmacy: [phone] 07/13/2014 12:15 PM

## 2014-07-13 NOTE — Patient Instructions (Signed)
It was a pleasure to meet you today!  Start taking pravastatin 20 mg every evening. Continue exercising on your stationary bike and try to include more lean proteins (baked chicken, fish, [place]) and fiber (vegetables and nuts) into your diet.   We will obtain another fasting cholesterol level on April 20th and we will see you in the office again on April 22nd.

## 2014-07-14 NOTE — Progress Notes (Signed)
Discussed plan with patient.  Agree with above plan.

## 2014-07-19 ENCOUNTER — Ambulatory Visit (INDEPENDENT_AMBULATORY_CARE_PROVIDER_SITE_OTHER): Payer: Medicare Other | Admitting: Internal Medicine

## 2014-07-19 ENCOUNTER — Encounter: Payer: Self-pay | Admitting: Internal Medicine

## 2014-07-19 VITALS — BP 134/78 | HR 67 | Temp 97.7°F | Wt 187.0 lb

## 2014-07-19 DIAGNOSIS — M2550 Pain in unspecified joint: Secondary | ICD-10-CM | POA: Diagnosis not present

## 2014-07-19 DIAGNOSIS — J4521 Mild intermittent asthma with (acute) exacerbation: Secondary | ICD-10-CM | POA: Diagnosis not present

## 2014-07-19 DIAGNOSIS — I2510 Atherosclerotic heart disease of native coronary artery without angina pectoris: Secondary | ICD-10-CM

## 2014-07-19 DIAGNOSIS — Z23 Encounter for immunization: Secondary | ICD-10-CM

## 2014-07-19 MED ORDER — PREDNISONE 10 MG PO TABS: ORAL_TABLET | ORAL | Status: DC

## 2014-07-19 NOTE — Patient Instructions (Signed)
Bronchospasm A bronchospasm is when the tubes that carry air in and out of your lungs (airways) spasm or tighten. During a bronchospasm it is hard to breathe. This is because the airways get smaller. A bronchospasm can be triggered by:  Allergies. These may be to animals, pollen, food, or mold.  Infection. This is a common cause of bronchospasm.  Exercise.  Irritants. These include pollution, cigarette smoke, strong odors, aerosol sprays, and paint fumes.  Weather changes.  Stress.  Being emotional. HOME CARE   Always have a plan for getting help. Know when to call your doctor and local emergency services (911 in the [place] Know where you can get emergency care.  Only take medicines as told by your doctor.  If you were prescribed an inhaler or nebulizer machine, ask your doctor how to use it correctly. Always use a spacer with your inhaler if you were given one.  Stay calm during an attack. Try to relax and breathe more slowly.  Control your home environment:  Change your heating and air conditioning filter at least once a month.  Limit your use of fireplaces and wood stoves.  Do not  smoke. Do not  allow smoking in your home.  Avoid perfumes and fragrances.  Get rid of pests (such as roaches and mice) and their droppings.  Throw away plants if you see mold on them.  Keep your house clean and dust free.  Replace carpet with wood, tile, or vinyl flooring. Carpet can trap dander and dust.  Use allergy-proof pillows, mattress covers, and box spring covers.  Wash bed sheets and blankets every week in hot water. Dry them in a dryer.  Use blankets that are made of polyester or cotton.  Wash hands frequently. GET HELP IF:  You have muscle aches.  You have chest pain.  The thick spit you spit or cough up (sputum) changes from clear or white to yellow, green, gray, or bloody.  The thick spit you spit or cough up gets thicker.  There are problems that may be related  to the medicine you are given such as:  A rash.  Itching.  Swelling.  Trouble breathing. GET HELP RIGHT AWAY IF:  You feel you cannot breathe or catch your breath.  You cannot stop coughing.  Your treatment is not helping you breathe better.  You have very bad chest pain. MAKE SURE YOU:   Understand these instructions.  Will watch your condition.  Will get help right away if you are not doing well or get worse. Document Released: 03/18/2009 Document Revised: 05/26/2013 Document Reviewed: 11/11/2012 [employer] Patient Information 2015 [employer]. This information is not intended to replace advice given to you by your health care provider. Make sure you discuss any questions you have with your health care provider.  

## 2014-07-19 NOTE — Progress Notes (Signed)
HPI  Pt presents to the clinic today with c/o cough and wheezing. She reports this started 2 months ago after she had a respiratory illness. She did get over the acute "cold" but the cough and wheezing has persisted. The cough is non productive. She denies fever, chills or nausea. She has been using her inhaler but has not tried anything OTC. She does have a history of allergies, sleep apnea, asthma and Grade 1 diastolic dysfunction. She no longer smokes. She is UTD on her flu shot. Pneumovax given in 2009.  Additionally, she reports worsening of her arthritic pain in her shoulders and hands. She is taking Aleve daily for this. It used to help but hasn't seemed to help as much lately. She recently started taking glucosamine but has not noticed any difference. She wants to know if she can get cortisone injections into her shoulders today.  Review of Systems      Past Medical History  Diagnosis Date  . Allergic rhinitis   . Diverticulitis of colon   . GERD (gastroesophageal reflux disease)   . Chronic depression   . Hypercholesterolemia     intolerance of statins and niaspan  . Apnea, sleep     mild, intolerant of cpap  . ACE-inhibitor cough   . Cataract   . Chronic headache   . Asthma, acute   . Hearing loss   . History of blood transfusion 2013  . Coronary artery disease   . Diabetes mellitus     type 2  . Shortness of breath   . Anxiety   . Hx of echocardiogram     Echo (11/2013):  Left ventricle: Mild   concentric hypertrophy. EF 55%. Wall motion was normal. Grade 1 diastolic   Dysfunction.  Aortic valve: A bioprosthesis was present and functioning normally.  Left atrium: The atrium was mildly dilated. Tricuspid valve: There was mild regurgitation.  Marland Kitchen PONV (postoperative nausea and vomiting)   . Thyroid disease   . Arthritis   . Anemia     iron deficiency anemia    Family History  Problem Relation Age of Onset  . Breast cancer Mother     breast  . Hypertension Father   .  Cancer Father     mesothelioma  . Asthma Father   . Stroke Paternal Grandfather   . Heart disease      History   Social History  . Marital Status: Married    Spouse Name: N/A  . Number of Children: 3  . Years of Education: N/A   Occupational History  . Retired     Quarry manager   Social History Main Topics  . Smoking status: Former Smoker -- 0.50 packs/day for 30 years    Types: Cigarettes    Quit date: 02/03/2008  . Smokeless tobacco: Never Used  . Alcohol Use: No  . Drug Use: No  . Sexual Activity: Not Currently   Other Topics Concern  . Not on file   Social History Narrative    Allergies  Allergen Reactions  . Benadryl [Diphenhydramine] Shortness Of Breath  . Voltaren [Diclofenac Sodium] Shortness Of Breath  . Budesonide-Formoterol Fumarate Other (See Comments)    Shakiness, tremors  . Amitriptyline Other (See Comments)    Unknown reaction  . Ativan [Lorazepam] Other (See Comments)    Causes double vision at highter than .5 mg dose  . Atorvastatin Other (See Comments)    Muscle aches and weakness  . Bupropion Hcl Other (See Comments)    "  cloud over me" depression  . Caffeine Other (See Comments)    jitters  . Codeine Sulfate Other (See Comments)    Makes chest hurt like a heart attack  . Demerol [Meperidine] Other (See Comments)    Unknown reaction  . Gabapentin Other (See Comments)    Unknown reaction  . Lisinopril Cough  . Loratadine Other (See Comments)    Unknown reaction  . Meperidine Hcl Other (See Comments)    Unknown reaction  . Metformin Nausea And Vomiting  . Mirtazapine Other (See Comments)    Unknown reaction  . Mometasone Furoate Nausea And Vomiting  . Morphine Sulfate Other (See Comments)    Chest pain like a heart attack  . Olanzapine Other (See Comments)    Unknown reaction   . Other Other (See Comments)    Beta Blockers, reaction shortness of breath  . Oxycodone-Acetaminophen Nausea And Vomiting  . Pioglitazone Other (See Comments)     Cannot take because of risk of bladder cancer  . Propoxyphene N-Acetaminophen Nausea And Vomiting  . Rosuvastatin Other (See Comments)    Muscle aches and weakness  . Shellfish Allergy Diarrhea  . Venlafaxine Other (See Comments)    Unknown reaction  . Zetia [Ezetimibe]     Weakness in legs, shakiness all over  . Zolpidem Tartrate Other (See Comments)     Jittery, diarrhea  . Latex Rash     Constitutional: Positive headache, fatigue. Denies fever or abrupt weight changes.  HEENT: Denies eye redness, eye pain, pressure behind the eyes, facial pain, nasal congestion, ear pain, ringing in the ears, wax buildup, runny nose or sore throat. Respiratory: Positive cough. Denies difficulty breathing or shortness of breath.  Cardiovascular: Denies chest pain, chest tightness, palpitations or swelling in the hands or feet.  MSK: Pt reports joint pains. Denies joint swelling, muscle pain or difficulty with gait.  No other specific complaints in a complete review of systems (except as listed in HPI above).  Objective:   BP 134/78 mmHg  Pulse 67  Temp(Src) 97.7 F (36.5 C) (Oral)  Wt 187 lb (84.823 kg)  SpO2 97% Wt Readings from Last 3 Encounters:  07/19/14 187 lb (84.823 kg)  07/07/14 187 lb 12.8 oz (85.186 kg)  04/21/14 184 lb (83.462 kg)     General: Appears herstated age, well developed, well nourished in NAD. HEENT: Head: normal shape and size, no sinus tenderness noted; Eyes: sclera white, no icterus, conjunctiva pink, PERRLA and EOMs intact; Ears: Tm's gray and intact, normal light reflex; Throat/Mouth: Teeth present, mucosa pink and moist, no exudate noted, no lesions or ulcerations noted.  Neck: No lymphadenopathy. Cardiovascular: Normal rate and rhythm. S1,S2 noted. ? Slight murmur- hard to tell though. No rubs or gallops noted.  Pulmonary/Chest: Normal effort and bilateral expiratory wheeze noted bilaterally. No respiratory distress. No rales or ronchi noted.  MSK: Decreased  internal and external rotation of bilateral shoulders. Some pain noted with palpation of the AC joints bilaterally. Strength 5/5 BUE. Hand grips equal.     Assessment & Plan:   Asthma Exacerbation:  Continue Albuterol prn eRx for pred taper x 6 days Delsym OTC for cough  Prevnar today  Multiple joint pain:  She has not had a rheum panel or imaging of her shoulders that I could see She declines that today eRx for pred taper x 6 days Continue Aleve as needed after you finish the prednisone Make follow up with Dr. Lorelei Pont for further evaluation of joint complaints  RTC  as needed or if symptoms persist.

## 2014-07-19 NOTE — Progress Notes (Signed)
Pre visit review using our clinic review tool, if applicable. No additional management support is needed unless otherwise documented below in the visit note. 

## 2014-07-21 ENCOUNTER — Ambulatory Visit: Payer: Self-pay | Admitting: Internal Medicine

## 2014-07-21 DIAGNOSIS — D509 Iron deficiency anemia, unspecified: Secondary | ICD-10-CM | POA: Diagnosis not present

## 2014-07-21 DIAGNOSIS — Z79899 Other long term (current) drug therapy: Secondary | ICD-10-CM | POA: Diagnosis not present

## 2014-07-23 ENCOUNTER — Encounter (HOSPITAL_COMMUNITY): Payer: Self-pay | Admitting: *Deleted

## 2014-07-23 ENCOUNTER — Emergency Department (HOSPITAL_COMMUNITY): Payer: Medicare Other

## 2014-07-23 ENCOUNTER — Observation Stay (HOSPITAL_COMMUNITY)
Admission: EM | Admit: 2014-07-23 | Discharge: 2014-07-24 | Disposition: A | Discharge status: Home / Self Care | Payer: Medicare Other | Attending: Cardiology | Admitting: Cardiology

## 2014-07-23 DIAGNOSIS — R110 Nausea: Secondary | ICD-10-CM | POA: Diagnosis not present

## 2014-07-23 DIAGNOSIS — I10 Essential (primary) hypertension: Secondary | ICD-10-CM | POA: Insufficient documentation

## 2014-07-23 DIAGNOSIS — K219 Gastro-esophageal reflux disease without esophagitis: Secondary | ICD-10-CM | POA: Diagnosis not present

## 2014-07-23 DIAGNOSIS — Z7982 Long term (current) use of aspirin: Secondary | ICD-10-CM | POA: Diagnosis not present

## 2014-07-23 DIAGNOSIS — Z953 Presence of xenogenic heart valve: Secondary | ICD-10-CM | POA: Insufficient documentation

## 2014-07-23 DIAGNOSIS — Z79899 Other long term (current) drug therapy: Secondary | ICD-10-CM | POA: Insufficient documentation

## 2014-07-23 DIAGNOSIS — E119 Type 2 diabetes mellitus without complications: Secondary | ICD-10-CM | POA: Insufficient documentation

## 2014-07-23 DIAGNOSIS — Z951 Presence of aortocoronary bypass graft: Secondary | ICD-10-CM | POA: Insufficient documentation

## 2014-07-23 DIAGNOSIS — I200 Unstable angina: Secondary | ICD-10-CM | POA: Diagnosis not present

## 2014-07-23 DIAGNOSIS — E780 Pure hypercholesterolemia: Secondary | ICD-10-CM | POA: Diagnosis not present

## 2014-07-23 DIAGNOSIS — R0789 Other chest pain: Secondary | ICD-10-CM | POA: Diagnosis not present

## 2014-07-23 DIAGNOSIS — E039 Hypothyroidism, unspecified: Secondary | ICD-10-CM | POA: Diagnosis not present

## 2014-07-23 DIAGNOSIS — I25110 Atherosclerotic heart disease of native coronary artery with unstable angina pectoris: Secondary | ICD-10-CM | POA: Diagnosis not present

## 2014-07-23 DIAGNOSIS — G4733 Obstructive sleep apnea (adult) (pediatric): Secondary | ICD-10-CM | POA: Diagnosis not present

## 2014-07-23 DIAGNOSIS — I2510 Atherosclerotic heart disease of native coronary artery without angina pectoris: Secondary | ICD-10-CM | POA: Diagnosis present

## 2014-07-23 DIAGNOSIS — R079 Chest pain, unspecified: Secondary | ICD-10-CM

## 2014-07-23 DIAGNOSIS — I359 Nonrheumatic aortic valve disorder, unspecified: Secondary | ICD-10-CM | POA: Diagnosis present

## 2014-07-23 DIAGNOSIS — Z87891 Personal history of nicotine dependence: Secondary | ICD-10-CM | POA: Diagnosis not present

## 2014-07-23 DIAGNOSIS — I252 Old myocardial infarction: Secondary | ICD-10-CM | POA: Insufficient documentation

## 2014-07-23 DIAGNOSIS — E785 Hyperlipidemia, unspecified: Secondary | ICD-10-CM | POA: Diagnosis present

## 2014-07-23 DIAGNOSIS — J45909 Unspecified asthma, uncomplicated: Secondary | ICD-10-CM | POA: Diagnosis not present

## 2014-07-23 HISTORY — DX: Obstructive sleep apnea (adult) (pediatric): G47.33

## 2014-07-23 LAB — BASIC METABOLIC PANEL
Anion gap: 10 (ref 5–15)
BUN: 20 mg/dL (ref 6–23)
CO2: 24 mmol/L (ref 19–32)
CREATININE: 0.85 mg/dL (ref 0.50–1.10)
Calcium: 8.7 mg/dL (ref 8.4–10.5)
Chloride: 103 mmol/L (ref 96–112)
GFR calc Af Amer: 79 mL/min — ABNORMAL LOW (ref >90)
GFR, EST NON AFRICAN AMERICAN: 68 mL/min — AB (ref >90)
Glucose, Bld: 195 mg/dL — ABNORMAL HIGH (ref 70–99)
POTASSIUM: 4.2 mmol/L (ref 3.5–5.1)
Sodium: 137 mmol/L (ref 135–145)

## 2014-07-23 LAB — CBC WITH DIFFERENTIAL/PLATELET
BASOS ABS: 0.2 10*3/uL — AB (ref 0.0–0.1)
Basophils Relative: 2 % — ABNORMAL HIGH (ref 0–1)
EOS PCT: 1 % (ref 0–5)
Eosinophils Absolute: 0.1 10*3/uL (ref 0.0–0.7)
HCT: 39.6 % (ref 36.0–46.0)
Hemoglobin: 13 g/dL (ref 12.0–15.0)
LYMPHS PCT: 25 % (ref 12–46)
Lymphs Abs: 2.9 10*3/uL (ref 0.7–4.0)
MCH: 27.9 pg (ref 26.0–34.0)
MCHC: 32.8 g/dL (ref 30.0–36.0)
MCV: 85 fL (ref 78.0–100.0)
Monocytes Absolute: 0.8 10*3/uL (ref 0.1–1.0)
Monocytes Relative: 7 % (ref 3–12)
NEUTROS ABS: 7.6 10*3/uL (ref 1.7–7.7)
Neutrophils Relative %: 65 % (ref 43–77)
PLATELETS: 178 10*3/uL (ref 150–400)
RBC: 4.66 MIL/uL (ref 3.87–5.11)
RDW: 13.7 % (ref 11.5–15.5)
WBC: 11.6 10*3/uL — AB (ref 4.0–10.5)

## 2014-07-23 LAB — I-STAT TROPONIN, ED: TROPONIN I, POC: 0 ng/mL (ref 0.00–0.08)

## 2014-07-23 LAB — TROPONIN I: Troponin I: <0.03 ng/mL (ref <0.031)

## 2014-07-23 NOTE — ED Notes (Signed)
Cardiology at the bedside.

## 2014-07-23 NOTE — H&P (Signed)
Physician History and Physical    [name] MRN: [id number] DOB/AGE: 04-Apr-1946 69 y.o. Admit date: 07/23/2014  Primary Cardiologist:  Darlina Guys  CC:  Chest pain  HPI:  69 yo F with known h/o CAD s/p CABG LIMA-LAD and SVG-RCA, AVR with Pounds bioprosthetic valve 10/2013 who presents with sudden onset chest pain, started 3 pm at rest, lasted 1.5 hrs, with minimal response to Nitro. Pt states the pain is 4-5/10, mid chest and associated with SOB, Nausea, felt like her "prior heart attack". It is also her first time to have CP since 04/2014 when they took her sternal wires out. She has baseline chest wall pain but this time it feels from inside and mimicking her prior MI.   After arrival to ER, her ECG showed no significant changes, she remained hemodynamically stable and 1st set of troponin was negative.   Upon my interview, she has been asymptomatic for couple of hours without obvious stress.    Review of systems: A review of 10 organ systems was done and is negative except as stated above in HPI  Past Medical History  Diagnosis Date  . Allergic rhinitis   . Diverticulitis of colon   . GERD (gastroesophageal reflux disease)   . Chronic depression   . Hypercholesterolemia     intolerance of statins and niaspan  . Apnea, sleep     mild, intolerant of cpap  . ACE-inhibitor cough   . Cataract   . Chronic headache   . Asthma, acute   . Hearing loss   . History of blood transfusion 2013  . Coronary artery disease   . Diabetes mellitus     type 2  . Shortness of breath   . Anxiety   . Hx of echocardiogram     Echo (11/2013):  Left ventricle: Mild   concentric hypertrophy. EF 55%. Wall motion was normal. Grade 1 diastolic   Dysfunction.  Aortic valve: A bioprosthesis was present and functioning normally.  Left atrium: The atrium was mildly dilated. Tricuspid valve: There was mild regurgitation.  Marland Kitchen PONV (postoperative nausea and vomiting)   . Thyroid disease   .  Arthritis   . Anemia     iron deficiency anemia   Past Surgical History  Procedure Laterality Date  . Cholecystectomy  2001  . Colectomy      lap sigmoid  . Tonsillectomy    . Bartholin gland cyst excision    . Tubal ligation    . Bladder suspension    . Breast cyst excision  1988    bilateral nonmalignant tumors, x3  . Vaginal delivery      3  . Thyroidectomy    . Abdominal hysterectomy w/ partial vaginactomy    . Appendectomy  1964  . Colonoscopy  2014    polyps found, 2 clamped off.  . Cardiac catheterization    . Coronary angioplasty  10/29/2007    Prox RCA & Mid Cx.  . Coronary artery bypass graft N/A 10/12/2013    Procedure: CORONARY ARTERY BYPASS GRAFT TIMES TWO;  Surgeon: [name], MD;  Location: [facility];  Service: Open Heart Surgery;  Laterality: N/A;  . Aortic valve replacement N/A 10/12/2013    Procedure: AORTIC VALVE REPLACEMENT (AVR);  Surgeon: [name], MD;  Location: [facility];  Service: Open Heart Surgery;  Laterality: N/A;  . Cataract extraction w/ intraocular lens  implant, bilateral    . Abdominal hysterectomy    . Sternal wires removal  N/A 04/13/2014    Procedure: STERNAL WIRES REMOVAL;  Surgeon: [name], MD;  Location: [facility] OR;  Service: Thoracic;  Laterality: N/A;  . Left heart catheterization with coronary angiogram N/A 10/09/2013    Procedure: LEFT HEART CATHETERIZATION WITH CORONARY ANGIOGRAM;  Surgeon: [name], MD;  Location: [facility] CATH LAB;  Service: Cardiovascular;  Laterality: N/A;   History   Social History  . Marital Status: Married    Spouse Name: N/A  . Number of Children: 3  . Years of Education: N/A   Occupational History  . Retired     Quarry manager   Social History Main Topics  . Smoking status: Former Smoker -- 0.50 packs/day for 30 years    Types: Cigarettes    Quit date: 02/03/2008  . Smokeless tobacco: Never Used  . Alcohol Use: No  . Drug Use: No  . Sexual Activity: Not Currently   Other Topics Concern  . Not  on file   Social History Narrative    Family History  Problem Relation Age of Onset  . Breast cancer Mother     breast  . Hypertension Father   . Cancer Father     mesothelioma  . Asthma Father   . Stroke Paternal Grandfather   . Heart disease       Allergies  Allergen Reactions  . Benadryl [Diphenhydramine] Shortness Of Breath  . Voltaren [Diclofenac Sodium] Shortness Of Breath  . Budesonide-Formoterol Fumarate Other (See Comments)    Shakiness, tremors  . Amitriptyline Other (See Comments)    Unknown reaction  . Ativan [Lorazepam] Other (See Comments)    Causes double vision at highter than .5 mg dose  . Atorvastatin Other (See Comments)    Muscle aches and weakness  . Bupropion Hcl Other (See Comments)    "cloud over me" depression  . Caffeine Other (See Comments)    jitters  . Codeine Sulfate Other (See Comments)    Makes chest hurt like a heart attack  . Demerol [Meperidine] Other (See Comments)    Unknown reaction  . Gabapentin Other (See Comments)    Unknown reaction  . Lisinopril Cough  . Loratadine Other (See Comments)    Unknown reaction  . Meperidine Hcl Other (See Comments)    Unknown reaction  . Metformin Nausea And Vomiting  . Mirtazapine Other (See Comments)    Unknown reaction  . Mometasone Furoate Nausea And Vomiting  . Morphine Sulfate Other (See Comments)    Chest pain like a heart attack  . Olanzapine Other (See Comments)    Unknown reaction   . Other Other (See Comments)    Beta Blockers, reaction shortness of breath  . Oxycodone-Acetaminophen Nausea And Vomiting  . Pioglitazone Other (See Comments)    Cannot take because of risk of bladder cancer  . Propoxyphene N-Acetaminophen Nausea And Vomiting  . Rosuvastatin Other (See Comments)    Muscle aches and weakness  . Shellfish Allergy Diarrhea  . Venlafaxine Other (See Comments)    Unknown reaction  . Zetia [Ezetimibe]     Weakness in legs, shakiness all over  . Zolpidem Tartrate  Other (See Comments)     Jittery, diarrhea  . Latex Rash     (Not in a hospital admission)  No current facility-administered medications for this encounter.  Current outpatient prescriptions:  .  albuterol (PROVENTIL HFA;VENTOLIN HFA) 108 (90 BASE) MCG/ACT inhaler, Inhale 2 puffs into the lungs every 6 (six) hours as needed for wheezing or shortness of  breath., Disp: , Rfl:  .  Calcium-Vitamin D (CALTRATE 600 PLUS-VIT D PO), Take 2 tablets by mouth 2 (two) times daily. , Disp: , Rfl:  .  FLUoxetine (PROZAC) 40 MG capsule, Take 1 capsule (40 mg total) by mouth 2 (two) times daily., Disp: 180 capsule, Rfl: 0 .  Glucosamine Sulfate 500 MG TABS, Take 1 tablet by mouth 3 (three) times daily., Disp: , Rfl:  .  Insulin Lispro Prot & Lispro (HUMALOG MIX 75/25 KWIKPEN) (75-25) 100 UNIT/ML Kwikpen, Inject 60 Units into the skin 2 (two) times daily., Disp: , Rfl:  .  lansoprazole (PREVACID) 30 MG capsule, Take 30 mg by mouth 2 (two) times daily before a meal., Disp: , Rfl:  .  levothyroxine (SYNTHROID, LEVOTHROID) 150 MCG tablet, Take 150 mcg by mouth daily.  , Disp: , Rfl:  .  predniSONE (DELTASONE) 10 MG tablet, Take 3 tabs on days 1-2, take 2 tabs on days 3-4, take 1 tab on days 5-6, Disp: 12 tablet, Rfl: 0 .  promethazine (PHENERGAN) 12.5 MG tablet, TAKE 1 TO 2 TABLETS BY MOUTH EVERY 6 HOURS AS NEEDED FOR NASUEA & VOMITING, Disp: 28 tablet, Rfl: 0 .  traZODone (DESYREL) 100 MG tablet, TAKE TWO TABLETS BY MOUTH NIGHTLY AT BEDTIME, Disp: 60 tablet, Rfl: 1 .  ONETOUCH DELICA LANCETS 31R MISC, USE TO CHECK BLOOD SUGAR THREE TIMES DAILY, Disp: , Rfl:  .  pravastatin (PRAVACHOL) 20 MG tablet, Take 1 tablet (20 mg total) by mouth every evening. (Patient not taking: Reported on 07/23/2014), Disp: 90 tablet, Rfl: 3 .  traMADol (ULTRAM) 50 MG tablet, TAKE ONE TABLET BY MOUTH EVERY SIX HOURS AS NEEDED (Patient not taking: Reported on 07/23/2014), Disp: 50 tablet, Rfl: 0  Physical Exam: Blood pressure 133/63,  pulse 70, temperature 97.8 F (36.6 C), temperature source Oral, resp. rate 21, SpO2 97 %.; There is no weight on file to calculate BMI. Temp:  [97.8 F (36.6 C)] 97.8 F (36.6 C) (02/19 1836) Pulse Rate:  [62-75] 70 (02/19 2226) Resp:  [12-21] 21 (02/19 2226) BP: (124-143)/(59-74) 133/63 mmHg (02/19 2226) SpO2:  [93 %-98 %] 97 % (02/19 2226)  No intake or output data in the 24 hours ending 07/23/14 2253 General: NAD Heent: MMM Neck: No JVD  CV: Nondisplaced PMI.  RRR, nl S1/S2, no X4/V8, 3/6 systolic murmur. No carotid bruit   Lungs: Clear to auscultation bilaterally with normal respiratory effort Abdomen: Soft, nontender, nondistended Extremities: No clubbing or cyanosis.  Normal pedal pulses. No pedal edema Skin: Intact without lesions or rashes  Neurologic: Alert and oriented x 3, grossly nonfocal  Psych: Normal mood and affect    Labs:  Recent Labs  07/23/14 1847  TROPONINI <0.03   Lab Results  Component Value Date   WBC 11.6* 07/23/2014   HGB 13.0 07/23/2014   HCT 39.6 07/23/2014   MCV 85.0 07/23/2014   PLT 178 07/23/2014    Recent Labs Lab 07/23/14 1847  NA 137  K 4.2  CL 103  CO2 24  BUN 20  CREATININE 0.85  CALCIUM 8.7  GLUCOSE 195*   Lab Results  Component Value Date   CHOL 220* 07/07/2014   HDL 30.20* 07/07/2014   LDLCALC 115* 06/23/2008   TRIG 329.0* 07/07/2014       EKG: NSR with inferior QWs. No ST depression/elevation.  Radiology:  Dg Chest 2 View  07/23/2014   CLINICAL DATA:  The pt is c/o of mid chest pain since 1500 today with sob  and Nausea. Cardiac history. Stents in heart. Pt has hx of GERD, Asthma, CAD, diabetes, thyroid disease, anemia. Past-Smoker of 30 yrs.  EXAM: CHEST  2 VIEW  COMPARISON:  04/13/2014  FINDINGS: Changes from cardiac surgery and aortic valve replacement are stable. Cardiac silhouette is borderline enlarged. No mediastinal or hilar masses or evidence of adenopathy.  Lungs are clear.  No pleural effusion or  pneumothorax.  Bony thorax is demineralized but intact.  IMPRESSION: No acute cardiopulmonary disease.   Electronically Signed   By: Lajean Manes M.D.   On: 07/23/2014 21:05    ASSESSMENT:   69 yo F with known h/o CAD s/p CABG LIMA-LAD and SVG-RCA, AVR with [employer] bioprosthetic valve 10/2013 who presents with sudden onset chest pain  IMPRESSION: 1. Unstable angina 2. AVR with bioprosthetic valve 3. CAD s/p 2v CABG   PLAN:  1. Observation with troponin trending for at least 2 more sets Q6hrs.  2. Continue ASA, statin, BB. Nitro patch.gtt PRN if chest pain recurs.  3. If troponin becames elevated, consider Heparin gtt and cath in AM 4. If Troponin remains neg x3, Nuclear stress in am versus outpatient stress.  5. Echocardiogram for Bioprosthetic valve and EF evaluation, last echo 1 month post op in 11/2013.   Signed: Manus Gunning, MD Cardiology Fellow 07/23/2014, 10:53 PM

## 2014-07-23 NOTE — ED Notes (Signed)
The pt is c/o of mid chest pain since 1500 today with sob and  Nausea.  Cardiac history.  Stents in heart

## 2014-07-23 NOTE — ED Provider Notes (Signed)
CSN: [id number]     Arrival date & time 07/23/14  1827 History   First MD Initiated Contact with Patient 07/23/14 1950     Chief Complaint  Patient presents with  . Chest Pain     (Consider location/radiation/quality/duration/timing/severity/associated sxs/prior Treatment) Patient is a 69 y.o. female presenting with chest pain. The history is provided by the patient.  Chest Pain Pain location:  L chest Pain quality: throbbing   Pain radiates to:  L shoulder Pain radiates to the back: yes   Pain severity:  Moderate Onset quality:  Sudden Duration:  3 hours Timing:  Constant Progression:  Resolved Chronicity:  New Context: at rest   Relieved by:  Nothing Worsened by:  Nothing tried Ineffective treatments:  Rest, nitroglycerin and aspirin Associated symptoms: cough and shortness of breath (mild)   Associated symptoms: no abdominal pain, no back pain, no dizziness, no fatigue, no fever, no headache, no nausea and not vomiting     Past Medical History  Diagnosis Date  . Allergic rhinitis   . Diverticulitis of colon   . GERD (gastroesophageal reflux disease)   . Chronic depression   . Hypercholesterolemia     intolerance of statins and niaspan  . Apnea, sleep     mild, intolerant of cpap  . ACE-inhibitor cough   . Cataract   . Chronic headache   . Asthma, acute   . Hearing loss   . History of blood transfusion 2013  . Coronary artery disease   . Diabetes mellitus     type 2  . Shortness of breath   . Anxiety   . Hx of echocardiogram     Echo (11/2013):  Left ventricle: Mild   concentric hypertrophy. EF 55%. Wall motion was normal. Grade 1 diastolic   Dysfunction.  Aortic valve: A bioprosthesis was present and functioning normally.  Left atrium: The atrium was mildly dilated. Tricuspid valve: There was mild regurgitation.  Marland Kitchen PONV (postoperative nausea and vomiting)   . Thyroid disease   . Arthritis   . Anemia     iron deficiency anemia   Past Surgical History   Procedure Laterality Date  . Cholecystectomy  2001  . Colectomy      lap sigmoid  . Tonsillectomy    . Bartholin gland cyst excision    . Tubal ligation    . Bladder suspension    . Breast cyst excision  1988    bilateral nonmalignant tumors, x3  . Vaginal delivery      3  . Thyroidectomy    . Abdominal hysterectomy w/ partial vaginactomy    . Appendectomy  1964  . Colonoscopy  2014    polyps found, 2 clamped off.  . Cardiac catheterization    . Coronary angioplasty  10/29/2007    Prox RCA & Mid Cx.  . Coronary artery bypass graft N/A 10/12/2013    Procedure: CORONARY ARTERY BYPASS GRAFT TIMES TWO;  Surgeon: [name], MD;  Location: [facility];  Service: Open Heart Surgery;  Laterality: N/A;  . Aortic valve replacement N/A 10/12/2013    Procedure: AORTIC VALVE REPLACEMENT (AVR);  Surgeon: [name], MD;  Location: [facility];  Service: Open Heart Surgery;  Laterality: N/A;  . Cataract extraction w/ intraocular lens  implant, bilateral    . Abdominal hysterectomy    . Sternal wires removal N/A 04/13/2014    Procedure: STERNAL WIRES REMOVAL;  Surgeon: [name], MD;  Location: [facility];  Service: Thoracic;  Laterality: N/A;  . Left heart catheterization with coronary angiogram N/A 10/09/2013    Procedure: LEFT HEART CATHETERIZATION WITH CORONARY ANGIOGRAM;  Surgeon: [name], MD;  Location: [facility];  Service: Cardiovascular;  Laterality: N/A;   Family History  Problem Relation Age of Onset  . Breast cancer Mother     breast  . Hypertension Father   . Cancer Father     mesothelioma  . Asthma Father   . Stroke Paternal Grandfather   . Heart disease     History  Substance Use Topics  . Smoking status: Former Smoker -- 0.50 packs/day for 30 years    Types: Cigarettes    Quit date: 02/03/2008  . Smokeless tobacco: Never Used  . Alcohol Use: No   OB History    Gravida Para Term Preterm AB TAB SAB Ectopic Multiple Living   3 3        3       Obstetric  Comments   1st Menstrual Cycle: 10 1st Pregnancy: 19     Review of Systems  Constitutional: Negative for fever and fatigue.  HENT: Negative for congestion and drooling.   Eyes: Negative for pain.  Respiratory: Positive for cough and shortness of breath (mild).   Cardiovascular: Positive for chest pain.  Gastrointestinal: Negative for nausea, vomiting, abdominal pain and diarrhea.  Genitourinary: Negative for dysuria and hematuria.  Musculoskeletal: Negative for back pain, gait problem and neck pain.  Skin: Negative for color change.  Neurological: Negative for dizziness and headaches.  Hematological: Negative for adenopathy.  Psychiatric/Behavioral: Negative for behavioral problems.  All other systems reviewed and are negative.     Allergies  Benadryl; Voltaren; Budesonide-formoterol fumarate; Amitriptyline; Ativan; Atorvastatin; Bupropion hcl; Caffeine; Codeine sulfate; Demerol; Gabapentin; Lisinopril; Loratadine; Meperidine hcl; Metformin; Mirtazapine; Mometasone furoate; Morphine sulfate; Olanzapine; Other; Oxycodone-acetaminophen; Pioglitazone; Propoxyphene n-acetaminophen; Rosuvastatin; Shellfish allergy; Venlafaxine; Zetia; Zolpidem tartrate; and Latex  Home Medications   Prior to Admission medications   Medication Sig Start Date End Date Taking? Authorizing Provider  albuterol (PROVENTIL HFA;VENTOLIN HFA) 108 (90 BASE) MCG/ACT inhaler Inhale 2 puffs into the lungs every 6 (six) hours as needed for wheezing or shortness of breath.   Yes Historical Provider, MD  Calcium-Vitamin D (CALTRATE 600 PLUS-VIT D PO) Take 2 tablets by mouth 2 (two) times daily.    Yes Historical Provider, MD  FLUoxetine (PROZAC) 40 MG capsule Take 1 capsule (40 mg total) by mouth 2 (two) times daily. 06/28/14  Yes Lucille Passy, MD  Glucosamine Sulfate 500 MG TABS Take 1 tablet by mouth 3 (three) times daily.   Yes Historical Provider, MD  Insulin Lispro Prot & Lispro (HUMALOG MIX 75/25 KWIKPEN) (75-25)  100 UNIT/ML Kwikpen Inject 60 Units into the skin 2 (two) times daily.   Yes Historical Provider, MD  lansoprazole (PREVACID) 30 MG capsule Take 30 mg by mouth 2 (two) times daily before a meal.   Yes Historical Provider, MD  levothyroxine (SYNTHROID, LEVOTHROID) 150 MCG tablet Take 150 mcg by mouth daily.     Yes Historical Provider, MD  predniSONE (DELTASONE) 10 MG tablet Take 3 tabs on days 1-2, take 2 tabs on days 3-4, take 1 tab on days 5-6 07/19/14  Yes Jearld Fenton, NP  promethazine (PHENERGAN) 12.5 MG tablet TAKE 1 TO 2 TABLETS BY MOUTH EVERY 6 HOURS AS NEEDED FOR NASUEA & VOMITING 05/05/14  Yes Lucille Passy, MD  traZODone (DESYREL) 100 MG tablet TAKE TWO TABLETS BY MOUTH NIGHTLY AT BEDTIME  06/17/14  Yes Lucille Passy, MD  ONETOUCH DELICA LANCETS 17E MISC USE TO CHECK BLOOD SUGAR THREE TIMES DAILY 05/17/14   Historical Provider, MD  pravastatin (PRAVACHOL) 20 MG tablet Take 1 tablet (20 mg total) by mouth every evening. Patient not taking: Reported on 07/23/2014 07/13/14   [name], MD  traMADol (ULTRAM) 50 MG tablet TAKE ONE TABLET BY MOUTH EVERY SIX HOURS AS NEEDED Patient not taking: Reported on 07/23/2014 06/02/14   Lucille Passy, MD   BP 129/61 mmHg  Pulse 70  Temp(Src) 97.8 F (36.6 C) (Oral)  Resp 15  SpO2 93% Physical Exam  Constitutional: She is oriented to person, place, and time. She appears well-developed and well-nourished.  HENT:  Head: Normocephalic.  Mouth/Throat: Oropharynx is clear and moist. No oropharyngeal exudate.  Eyes: Conjunctivae and EOM are normal. Pupils are equal, round, and reactive to light.  Neck: Normal range of motion. Neck supple.  Cardiovascular: Normal rate, regular rhythm, normal heart sounds and intact distal pulses.  Exam reveals no gallop and no friction rub.   No murmur heard. Pulmonary/Chest: Effort normal and breath sounds normal. No respiratory distress. She has no wheezes.  Abdominal: Soft. Bowel sounds are normal. There is no  tenderness. There is no rebound and no guarding.  Musculoskeletal: Normal range of motion. She exhibits no edema or tenderness.  Neurological: She is alert and oriented to person, place, and time.  Skin: Skin is warm and dry.  Psychiatric: She has a normal mood and affect. Her behavior is normal.  Nursing note and vitals reviewed.   ED Course  Procedures (including critical care time) Labs Review Labs Reviewed  BASIC METABOLIC PANEL - Abnormal; Notable for the following:    Glucose, Bld 195 (*)    GFR calc non Af Amer 68 (*)    GFR calc Af Amer 79 (*)    All other components within normal limits  CBC WITH DIFFERENTIAL/PLATELET - Abnormal; Notable for the following:    WBC 11.6 (*)    Basophils Relative 2 (*)    Basophils Absolute 0.2 (*)    All other components within normal limits  TROPONIN I  I-STAT TROPOININ, ED    Imaging Review Dg Chest 2 View  07/23/2014   CLINICAL DATA:  The pt is c/o of mid chest pain since 1500 today with sob and Nausea. Cardiac history. Stents in heart. Pt has hx of GERD, Asthma, CAD, diabetes, thyroid disease, anemia. Past-Smoker of 30 yrs.  EXAM: CHEST  2 VIEW  COMPARISON:  04/13/2014  FINDINGS: Changes from cardiac surgery and aortic valve replacement are stable. Cardiac silhouette is borderline enlarged. No mediastinal or hilar masses or evidence of adenopathy.  Lungs are clear.  No pleural effusion or pneumothorax.  Bony thorax is demineralized but intact.  IMPRESSION: No acute cardiopulmonary disease.   Electronically Signed   By: Lajean Manes M.D.   On: 07/23/2014 21:05     EKG Interpretation   Date/Time:  Friday July 23 2014 18:30:54 EST Ventricular Rate:  72 PR Interval:  196 QRS Duration: 88 QT Interval:  420 QTC Calculation: 459 R Axis:   52 Text Interpretation:  Normal sinus rhythm Possible Anterolateral infarct ,  age undetermined No significant change since last tracing Confirmed by  Tayloranne Lekas  MD, [place] ([id number]) on 07/23/2014  7:53:55 PM      MDM   Final diagnoses:  Other chest pain    8:19 PM 69 y.o. female w hx of CAD s/p  CABG, DM, HLP who presents with left-sided throbbing chest pain which began while at rest playing cards around 3 PM today. She states it remained constant for about 1.5 hours and then was intermittent for another 1.5 hours. She is currently asymptomatic on exam. She states that she took several nitroglycerin at home without significant relief. She also took a full aspirin today. She is afebrile and vital signs are unremarkable here.screening labs and EKG this far unremarkable. Will get chest x-ray as well. Pt notes pain feels like previous MI.   Consulted cards who will admit.    Pamella Pert, MD 07/23/14 2333

## 2014-07-23 NOTE — ED Notes (Signed)
Patient transported to X-ray 

## 2014-07-23 NOTE — ED Notes (Signed)
Pt wheeled back to room. Pt placed into gown and on monitor. Pt placed on 5 lead, blood pressure, and pulse ox. Pts family remains at bedside.

## 2014-07-24 ENCOUNTER — Observation Stay (HOSPITAL_COMMUNITY): Payer: Medicare Other

## 2014-07-24 ENCOUNTER — Encounter (HOSPITAL_COMMUNITY): Payer: Self-pay | Admitting: Physician Assistant

## 2014-07-24 ENCOUNTER — Telehealth: Payer: Self-pay | Admitting: Cardiology

## 2014-07-24 DIAGNOSIS — I359 Nonrheumatic aortic valve disorder, unspecified: Secondary | ICD-10-CM

## 2014-07-24 DIAGNOSIS — E785 Hyperlipidemia, unspecified: Secondary | ICD-10-CM | POA: Diagnosis present

## 2014-07-24 DIAGNOSIS — E780 Pure hypercholesterolemia: Secondary | ICD-10-CM | POA: Diagnosis not present

## 2014-07-24 DIAGNOSIS — I2510 Atherosclerotic heart disease of native coronary artery without angina pectoris: Secondary | ICD-10-CM | POA: Diagnosis not present

## 2014-07-24 DIAGNOSIS — R079 Chest pain, unspecified: Secondary | ICD-10-CM

## 2014-07-24 DIAGNOSIS — I10 Essential (primary) hypertension: Secondary | ICD-10-CM | POA: Diagnosis not present

## 2014-07-24 LAB — CBC
HCT: 39.9 % (ref 36.0–46.0)
HEMATOCRIT: 39.2 % (ref 36.0–46.0)
Hemoglobin: 13.2 g/dL (ref 12.0–15.0)
Hemoglobin: 12.9 g/dL (ref 12.0–15.0)
MCH: 28 pg (ref 26.0–34.0)
MCH: 28.6 pg (ref 26.0–34.0)
MCHC: 33.1 g/dL (ref 30.0–36.0)
MCHC: 32.9 g/dL (ref 30.0–36.0)
MCV: 84.7 fL (ref 78.0–100.0)
MCV: 86.9 fL (ref 78.0–100.0)
Platelets: 156 10*3/uL (ref 150–400)
Platelets: 160 10*3/uL (ref 150–400)
RBC: 4.71 MIL/uL (ref 3.87–5.11)
RBC: 4.51 MIL/uL (ref 3.87–5.11)
RDW: 13.9 % (ref 11.5–15.5)
RDW: 14 % (ref 11.5–15.5)
WBC: 11 10*3/uL — ABNORMAL HIGH (ref 4.0–10.5)
WBC: 8.2 10*3/uL (ref 4.0–10.5)

## 2014-07-24 LAB — TROPONIN I
Troponin I: <0.03 ng/mL (ref <0.031)
Troponin I: <0.03 ng/mL (ref <0.031)
Troponin I: <0.03 ng/mL (ref <0.031)

## 2014-07-24 LAB — BASIC METABOLIC PANEL
Anion gap: 8 (ref 5–15)
BUN: 16 mg/dL (ref 6–23)
CALCIUM: 8.3 mg/dL — AB (ref 8.4–10.5)
CO2: 26 mmol/L (ref 19–32)
CREATININE: 0.88 mg/dL (ref 0.50–1.10)
Chloride: 103 mmol/L (ref 96–112)
GFR calc Af Amer: 76 mL/min — ABNORMAL LOW (ref >90)
GFR calc non Af Amer: 66 mL/min — ABNORMAL LOW (ref >90)
Glucose, Bld: 156 mg/dL — ABNORMAL HIGH (ref 70–99)
Potassium: 3.9 mmol/L (ref 3.5–5.1)
Sodium: 137 mmol/L (ref 135–145)

## 2014-07-24 LAB — GLUCOSE, CAPILLARY
GLUCOSE-CAPILLARY: 149 mg/dL — AB (ref 70–99)
Glucose-Capillary: 102 mg/dL — ABNORMAL HIGH (ref 70–99)
Glucose-Capillary: 145 mg/dL — ABNORMAL HIGH (ref 70–99)
Glucose-Capillary: 186 mg/dL — ABNORMAL HIGH (ref 70–99)

## 2014-07-24 LAB — CREATININE, SERUM
Creatinine, Ser: 0.86 mg/dL (ref 0.50–1.10)
GFR calc Af Amer: 78 mL/min — ABNORMAL LOW (ref >90)
GFR calc non Af Amer: 67 mL/min — ABNORMAL LOW (ref >90)

## 2014-07-24 LAB — PROTIME-INR
INR: 1.13 (ref 0.00–1.49)
PROTHROMBIN TIME: 14.6 s (ref 11.6–15.2)

## 2014-07-24 MED ORDER — TECHNETIUM TC 99M SESTAMIBI GENERIC - CARDIOLITE
10.0000 | Freq: Once | INTRAVENOUS | Status: AC | PRN
  Administered 2014-07-24: 10 via INTRAVENOUS

## 2014-07-24 MED ORDER — PANTOPRAZOLE SODIUM 20 MG PO TBEC
20.0000 mg | DELAYED_RELEASE_TABLET | Freq: Every day | ORAL | Status: DC
  Filled 2014-07-24: qty 1

## 2014-07-24 MED ORDER — ONDANSETRON HCL 4 MG/2ML IJ SOLN: 4.0000 mg | Freq: Four times a day (QID) | INTRAMUSCULAR | Status: DC | PRN

## 2014-07-24 MED ORDER — REGADENOSON 0.4 MG/5ML IV SOLN
0.4000 mg | Freq: Once | INTRAVENOUS | Status: AC
  Administered 2014-07-24: 0.4 mg via INTRAVENOUS

## 2014-07-24 MED ORDER — LEVOTHYROXINE SODIUM 150 MCG PO TABS
150.0000 ug | ORAL_TABLET | Freq: Every day | ORAL | Status: DC
  Administered 2014-07-24: 150 ug via ORAL
  Filled 2014-07-24 (×2): qty 1

## 2014-07-24 MED ORDER — ASPIRIN EC 81 MG PO TBEC
81.0000 mg | DELAYED_RELEASE_TABLET | Freq: Every day | ORAL | Status: DC
  Filled 2014-07-24: qty 1

## 2014-07-24 MED ORDER — TECHNETIUM TC 99M SESTAMIBI - CARDIOLITE
30.0000 | Freq: Once | INTRAVENOUS | Status: AC | PRN
  Administered 2014-07-24: 30 via INTRAVENOUS

## 2014-07-24 MED ORDER — HEPARIN SODIUM (PORCINE) 5000 UNIT/ML IJ SOLN
5000.0000 [IU] | Freq: Three times a day (TID) | INTRAMUSCULAR | Status: DC
  Administered 2014-07-24: 5000 [IU] via SUBCUTANEOUS
  Filled 2014-07-24 (×5): qty 1

## 2014-07-24 MED ORDER — NITROGLYCERIN 0.4 MG SL SUBL: 0.4000 mg | SUBLINGUAL_TABLET | SUBLINGUAL | Status: DC | PRN

## 2014-07-24 MED ORDER — REGADENOSON 0.4 MG/5ML IV SOLN
INTRAVENOUS | Status: AC
  Administered 2014-07-24: 0.4 mg via INTRAVENOUS
  Filled 2014-07-24: qty 5

## 2014-07-24 MED ORDER — TRAZODONE HCL 100 MG PO TABS
200.0000 mg | ORAL_TABLET | Freq: Every day | ORAL | Status: DC
  Administered 2014-07-24: 200 mg via ORAL
  Filled 2014-07-24 (×2): qty 2

## 2014-07-24 MED ORDER — INSULIN LISPRO PROT & LISPRO (75-25 MIX) 100 UNIT/ML KWIKPEN: 60.0000 [IU] | PEN_INJECTOR | Freq: Two times a day (BID) | SUBCUTANEOUS | Status: DC

## 2014-07-24 NOTE — Progress Notes (Signed)
69 y.o female received to room 213 via wheelchair for c/o chest pain, she has been evaluated in ER and will be overnight for ECHO in am.  Not currently c/o chest pain, pt is concerned that she will not "sleep unless I have 200 mg po of Trazadone"  Will notify her doctor of this.

## 2014-07-24 NOTE — Progress Notes (Addendum)
Patient Name: [name] Date of Encounter: 07/24/2014     Principal Problem:   Chest pain Active Problems:   Hypertension   GERD   Coronary Artery Disease s/p CABG 10/2013   Aortic valve disorder   Hypercholesterolemia   OSA (obstructive sleep apnea)   Diabetes mellitus   H/O aortic valve replacement    SUBJECTIVE  Seen in nuc med. No further chest pain.    CURRENT MEDS . aspirin EC  81 mg Oral Daily  . heparin  5,000 Units Subcutaneous 3 times per day  . Insulin Lispro Prot & Lispro  60 Units Subcutaneous BID  . levothyroxine  150 mcg Oral QAC breakfast  . pantoprazole  20 mg Oral Daily  . traZODone  200 mg Oral QHS    OBJECTIVE  Filed Vitals:   07/24/14 0957 07/24/14 0959 07/24/14 1001 07/24/14 1446  BP: 114/80 119/60 113/58 124/69  Pulse:    70  Temp:    98.2 F (36.8 C)  TempSrc:    Oral  Resp:    18  Height:      Weight:      SpO2:    95%    Intake/Output Summary (Last 24 hours) at 07/24/14 1632 Last data filed at 07/24/14 1447  Gross per 24 hour  Intake    240 ml  Output    800 ml  Net   -560 ml   Filed Weights   07/23/14 2345  Weight: 184 lb 1.4 oz (83.5 kg)    PHYSICAL EXAM  General: Pleasant, NAD. Neuro: Alert and oriented X 3. Moves all extremities spontaneously. Psych: Normal affect. HEENT:  Normal  Neck: Supple without bruits or JVD. Lungs:  Resp regular and unlabored, CTA. Heart: RRR no s3, s4, or murmurs. Abdomen: Soft, non-tender, non-distended, BS + x 4.  Extremities: No clubbing, cyanosis or edema. DP/PT/Radials 2+ and equal bilaterally.  Accessory Clinical Findings  CBC  Recent Labs  07/23/14 1847 07/24/14 0258 07/24/14 1159  WBC 11.6* 11.0* 8.2  NEUTROABS 7.6  --   --   HGB 13.0 13.2 12.9  HCT 39.6 39.9 39.2  MCV 85.0 84.7 86.9  PLT 178 156 683   Basic Metabolic Panel  Recent Labs  07/23/14 1847 07/24/14 0258 07/24/14 1159  NA 137  --  137  K 4.2  --  3.9  CL 103  --  103  CO2 24  --  26    GLUCOSE 195*  --  156*  BUN 20  --  16  CREATININE 0.85 0.86 0.88  CALCIUM 8.7  --  8.3*   Liver Function Tests No results for input(s): AST, ALT, ALKPHOS, BILITOT, PROT, ALBUMIN in the last 72 hours. No results for input(s): LIPASE, AMYLASE in the last 72 hours. Cardiac Enzymes  Recent Labs  07/24/14 0258 07/24/14 1159 07/24/14 1504  TROPONINI <0.03 <0.03 <0.03    TELE  NSR   Radiology/Studies  Dg Chest 2 View  07/23/2014   CLINICAL DATA:  The pt is c/o of mid chest pain since 1500 today with sob and Nausea. Cardiac history. Stents in heart. Pt has hx of GERD, Asthma, CAD, diabetes, thyroid disease, anemia. Past-Smoker of 30 yrs.  EXAM: CHEST  2 VIEW  COMPARISON:  04/13/2014  FINDINGS: Changes from cardiac surgery and aortic valve replacement are stable. Cardiac silhouette is borderline enlarged. No mediastinal or hilar masses or evidence of adenopathy.  Lungs are clear.  No pleural effusion or pneumothorax.  Bony thorax  is demineralized but intact.  IMPRESSION: No acute cardiopulmonary disease.   Electronically Signed   By: Lajean Manes M.D.   On: 07/23/2014 21:05   Nm Myocar Multi W/spect W/wall Motion / Ef  07/24/2014   CLINICAL DATA:  Acute chest pain, unspecified  EXAM: MYOCARDIAL IMAGING WITH SPECT (REST AND PHARMACOLOGIC-STRESS)  GATED LEFT VENTRICULAR WALL MOTION STUDY  LEFT VENTRICULAR EJECTION FRACTION  TECHNIQUE: Standard myocardial SPECT imaging was performed after resting intravenous injection of 10 mCi Tc-60m sestamibi. Subsequently, intravenous infusion of Lexiscan was performed under the supervision of the Cardiology staff. At peak effect of the drug, 30 mCi Tc-36m sestamibi was injected intravenously and standard myocardial SPECT imaging was performed. Quantitative gated imaging was also performed to evaluate left ventricular wall motion, and estimate left ventricular ejection fraction.  COMPARISON:  None.  FINDINGS: Perfusion: No decreased activity in the left  ventricle on stress imaging to suggest reversible ischemia or infarction.  Wall Motion: Normal left ventricular wall motion. No left ventricular dilation.  Left Ventricular Ejection Fraction: 72 %  End diastolic volume 78 ml  End systolic volume 22 ml  IMPRESSION: 1. No reversible ischemia or infarction.  2. Normal left ventricular wall motion.  3. Left ventricular ejection fraction 72%  4. Low-risk stress test findings*.  *2012 Appropriate Use Criteria for Coronary Revascularization Focused Update: J Am Coll Cardiol. 5643;32(9):518-841. http://content.airportbarriers.com.aspx?articleid=1201161   Electronically Signed   By: Jerilynn Mages.  Shick M.D.   On: 07/24/2014 13:39    ASSESSMENT AND PLAN  [name] is a 69 y.o. female with a history of CAD s/p CABG (LIMA-LAD and SVG-RCA), HTN, HLD, and AVR with Barnaby bioprosthetic valve 10/2013 who presented to [facility] on 07/23/14 with sudden onset chest pain.  Chest pain- -- She has ruled out for MI by serial enzymes. ECG with no acute ST or TW changes -- Nuclear stress test today. Can likely go home if negative.  -- Continue ASA and statin. She has multiple allergies. Not on BB.  AVR with bioprosthetic valve -- Echocardiogram for Bioprosthetic valve and EF evaluation, last echo 1 month post op in 11/2013.  -- Cont SBE prophylaxis  -- No further w/u indicated.   HLD- continue statin   OSA- CPAP intolerant  Hypothyroidism- continue synthroid  Signed, Eileen Stanford PA-C  Pager [phone]

## 2014-07-24 NOTE — Discharge Instructions (Signed)

## 2014-07-24 NOTE — Discharge Summary (Signed)
Discharge Summary   Patient ID: [name] MRN: [id number], DOB/AGE: 10/25/1945 69 y.o. Admit date: 07/23/2014 D/C date:     07/24/2014  Primary Cardiologist: Dr. Julianne Handler  Principal Problem:   Chest pain Active Problems:   Hypertension   GERD   Coronary Artery Disease s/p CABG 10/2013   Aortic valve disorder   Hypercholesterolemia   OSA (obstructive sleep apnea)   Diabetes mellitus   H/O aortic valve replacement    Admission Dates: 07/23/14-07/24/14 Discharge Diagnosis: Chest pain s/p normal nuclear stress test.   HPI: [name] is a 69 y.o. female with a history of CAD s/p CABG (LIMA-LAD and SVG-RCA), HTN, HLD, and AVR with Klaas bioprosthetic valve 10/2013 who presented to [facility] on 07/23/14 with sudden onset chest pain.  The chest pain started 3 pm at rest the night prior to admission. It lasted 1.5 hrs, with minimal response to Nitro. Pt states the pain is 4-5/10, mid chest and associated with SOB, Nausea, felt like her "prior heart attack". It is also her first time to have CP since 04/2014 when they took her sternal wires out. She has baseline chest wall pain but this time it feels from inside and mimicking her prior MI.    Hospital Course  Chest pain- -- She has ruled out for MI by serial enzymes. ECG with no acute ST or TW changes -- Underwent nuclear stress test today, which was negative for ischemia. LVEF 72% -- Continue ASA and statin. She has multiple allergies. Not on BB.  AVR with bioprosthetic valve -- Echocardiogram for Bioprosthetic valve and EF evaluation, last echo 1 month post op in 11/2013.  -- Cont SBE prophylaxis  -- No further w/u indicated.   HLD- continue statin   OSA- CPAP intolerant  Hypothyroidism- continue synthroid  The patient has had an uncomplicated hospital course and is recovering well. All follow-up appointments have been scheduled.  Discharge medications are listed below.   Discharge Vitals: Blood pressure 124/69, pulse 70,  temperature 98.2 F (36.8 C), temperature source Oral, resp. rate 18, height 5\' 7"  (1.702 m), weight 184 lb 1.4 oz (83.5 kg), SpO2 95 %.  Labs: Lab Results  Component Value Date   WBC 8.2 07/24/2014   HGB 12.9 07/24/2014   HCT 39.2 07/24/2014   MCV 86.9 07/24/2014   PLT 160 07/24/2014     Recent Labs Lab 07/24/14 1159  NA 137  K 3.9  CL 103  CO2 26  BUN 16  CREATININE 0.88  CALCIUM 8.3*  GLUCOSE 156*    Recent Labs  07/23/14 1847 07/24/14 0258 07/24/14 1159  TROPONINI <0.03 <0.03 <0.03   Lab Results  Component Value Date   CHOL 220* 07/07/2014   HDL 30.20* 07/07/2014   LDLCALC 115* 06/23/2008   TRIG 329.0* 07/07/2014     Diagnostic Studies/Procedures   Dg Chest 2 View  07/23/2014   CLINICAL DATA:  The pt is c/o of mid chest pain since 1500 today with sob and Nausea. Cardiac history. Stents in heart. Pt has hx of GERD, Asthma, CAD, diabetes, thyroid disease, anemia. Past-Smoker of 30 yrs.  EXAM: CHEST  2 VIEW  COMPARISON:  04/13/2014  FINDINGS: Changes from cardiac surgery and aortic valve replacement are stable. Cardiac silhouette is borderline enlarged. No mediastinal or hilar masses or evidence of adenopathy.  Lungs are clear.  No pleural effusion or pneumothorax.  Bony thorax is demineralized but intact.  IMPRESSION: No acute cardiopulmonary disease.   Electronically Signed  By: Lajean Manes M.D.   On: 07/23/2014 21:05   Nm Myocar Multi W/spect W/wall Motion / Ef  07/24/2014   CLINICAL DATA:  Acute chest pain, unspecified  EXAM: MYOCARDIAL IMAGING WITH SPECT (REST AND PHARMACOLOGIC-STRESS)  GATED LEFT VENTRICULAR WALL MOTION STUDY  LEFT VENTRICULAR EJECTION FRACTION  TECHNIQUE: Standard myocardial SPECT imaging was performed after resting intravenous injection of 10 mCi Tc-73m sestamibi. Subsequently, intravenous infusion of Lexiscan was performed under the supervision of the Cardiology staff. At peak effect of the drug, 30 mCi Tc-47m sestamibi was injected  intravenously and standard myocardial SPECT imaging was performed. Quantitative gated imaging was also performed to evaluate left ventricular wall motion, and estimate left ventricular ejection fraction.  COMPARISON:  None.  FINDINGS: Perfusion: No decreased activity in the left ventricle on stress imaging to suggest reversible ischemia or infarction.  Wall Motion: Normal left ventricular wall motion. No left ventricular dilation.  Left Ventricular Ejection Fraction: 72 %  End diastolic volume 78 ml  End systolic volume 22 ml  IMPRESSION: 1. No reversible ischemia or infarction.  2. Normal left ventricular wall motion.  3. Left ventricular ejection fraction 72%  4. Low-risk stress test findings*.  *2012 Appropriate Use Criteria for Coronary Revascularization Focused Update: J Am Coll Cardiol. 8889;16(9):450-388. http://content.airportbarriers.com.aspx?articleid=1201161   Electronically Signed   By: Jerilynn Mages.  Shick M.D.   On: 07/24/2014 13:39    Discharge Medications     Medication List    TAKE these medications        albuterol 108 (90 BASE) MCG/ACT inhaler  Commonly known as:  PROVENTIL HFA;VENTOLIN HFA  Inhale 2 puffs into the lungs every 6 (six) hours as needed for wheezing or shortness of breath.     CALTRATE 600 PLUS-VIT D PO  Take 2 tablets by mouth 2 (two) times daily.     FLUoxetine 40 MG capsule  Commonly known as:  PROZAC  Take 1 capsule (40 mg total) by mouth 2 (two) times daily.     Glucosamine Sulfate 500 MG Tabs  Take 1 tablet by mouth 3 (three) times daily.     HUMALOG MIX 75/25 KWIKPEN (75-25) 100 UNIT/ML Kwikpen  Generic drug:  Insulin Lispro Prot & Lispro  Inject 60 Units into the skin 2 (two) times daily.     lansoprazole 30 MG capsule  Commonly known as:  PREVACID  Take 30 mg by mouth 2 (two) times daily before a meal.     levothyroxine 150 MCG tablet  Commonly known as:  SYNTHROID, LEVOTHROID  Take 150 mcg by mouth daily.     ONETOUCH DELICA LANCETS 82C Misc   USE TO CHECK BLOOD SUGAR THREE TIMES DAILY     pravastatin 20 MG tablet  Commonly known as:  PRAVACHOL  Take 1 tablet (20 mg total) by mouth every evening.     predniSONE 10 MG tablet  Commonly known as:  DELTASONE  Take 3 tabs on days 1-2, take 2 tabs on days 3-4, take 1 tab on days 5-6     promethazine 12.5 MG tablet  Commonly known as:  PHENERGAN  TAKE 1 TO 2 TABLETS BY MOUTH EVERY 6 HOURS AS NEEDED FOR NASUEA & VOMITING     traMADol 50 MG tablet  Commonly known as:  ULTRAM  TAKE ONE TABLET BY MOUTH EVERY SIX HOURS AS NEEDED     traZODone 100 MG tablet  Commonly known as:  DESYREL  TAKE TWO TABLETS BY MOUTH NIGHTLY AT BEDTIME  Disposition   The patient will be discharged in stable condition to home.  Follow-up Information    Follow up with [name], MD.   Specialty:  Cardiology   Why:  The office will call you to make an appoinment., If you do not hear from them, please contact them., You should be seen within 2-3  weeks.   Contact information:   [place] [place] [address] [phone]         Duration of Discharge Encounter: Greater than 30 minutes including physician and PA time.  Mable Fill R PA-C 07/24/2014, 3:49 PM   Agree with note as outlined by Angelena Form, PA  Signed, Fransico Him, MD [facility] 07/24/2014

## 2014-07-24 NOTE — Progress Notes (Addendum)
SUBJECTIVE:  Currently in nuclear medicine for stress test  OBJECTIVE:   Vitals:   Filed Vitals:   07/23/14 2226 07/23/14 2311 07/23/14 2345 07/24/14 0317  BP: 133/63 135/60 136/73 129/78  Pulse: 70 69 67 66  Temp:   98.6 F (37 C) 98 F (36.7 C)  TempSrc:   Oral Oral  Resp: 21 17 18 18   Height:   5\' 7"  (1.702 m)   Weight:   184 lb 1.4 oz (83.5 kg)   SpO2: 97% 99% 97% 97%   I&O's:   Intake/Output Summary (Last 24 hours) at 07/24/14 0859 Last data filed at 07/24/14 0317  Gross per 24 hour  Intake      0 ml  Output    800 ml  Net   -800 ml   TELEMETRY: Reviewed telemetry pt in NSR:     PHYSICAL EXAM Patient in nuclear medicine   LABS: Basic Metabolic Panel:  Recent Labs  07/23/14 1847 07/24/14 0258  NA 137  --   K 4.2  --   CL 103  --   CO2 24  --   GLUCOSE 195*  --   BUN 20  --   CREATININE 0.85 0.86  CALCIUM 8.7  --    Liver Function Tests: No results for input(s): AST, ALT, ALKPHOS, BILITOT, PROT, ALBUMIN in the last 72 hours. No results for input(s): LIPASE, AMYLASE in the last 72 hours. CBC:  Recent Labs  07/23/14 1847 07/24/14 0258  WBC 11.6* 11.0*  NEUTROABS 7.6  --   HGB 13.0 13.2  HCT 39.6 39.9  MCV 85.0 84.7  PLT 178 156   Cardiac Enzymes:  Recent Labs  07/23/14 1847 07/24/14 0258  TROPONINI <0.03 <0.03   BNP: Invalid input(s): POCBNP D-Dimer: No results for input(s): DDIMER in the last 72 hours. Hemoglobin A1C: No results for input(s): HGBA1C in the last 72 hours. Fasting Lipid Panel: No results for input(s): CHOL, HDL, LDLCALC, TRIG, CHOLHDL, LDLDIRECT in the last 72 hours. Thyroid Function Tests: No results for input(s): TSH, T4TOTAL, T3FREE, THYROIDAB in the last 72 hours.  Invalid input(s): FREET3 Anemia Panel: No results for input(s): VITAMINB12, FOLATE, FERRITIN, TIBC, IRON, RETICCTPCT in the last 72 hours. Coag Panel:   Lab Results  Component Value Date   INR 1.40 10/12/2013   INR 1.06 10/09/2013   INR  1.0 12/31/2007    RADIOLOGY: Dg Chest 2 View  07/23/2014   CLINICAL DATA:  The pt is c/o of mid chest pain since 1500 today with sob and Nausea. Cardiac history. Stents in heart. Pt has hx of GERD, Asthma, CAD, diabetes, thyroid disease, anemia. Past-Smoker of 30 yrs.  EXAM: CHEST  2 VIEW  COMPARISON:  04/13/2014  FINDINGS: Changes from cardiac surgery and aortic valve replacement are stable. Cardiac silhouette is borderline enlarged. No mediastinal or hilar masses or evidence of adenopathy.  Lungs are clear.  No pleural effusion or pneumothorax.  Bony thorax is demineralized but intact.  IMPRESSION: No acute cardiopulmonary disease.   Electronically Signed   By: Lajean Manes M.D.   On: 07/23/2014 21:05   ASSESSMENT: 69 yo F with known h/o CAD s/p CABG LIMA-LAD and SVG-RCA, AVR with Yeh bioprosthetic valve 10/2013 who presents with sudden onset chest pain  IMPRESSION: 1. Unstable angina 2. AVR with bioprosthetic valve 3. CAD s/p 2v CABG   PLAN:  1. She has ruled out for MI by serial enzymes 2. Continue ASA, statin, BB. Nitro patch. 3. Now in Nuclear  medicine for stress test today to rule out ischemia 4. Echocardiogram for Bioprosthetic valve and EF evaluation, last echo 1 month post op in 11/2013.     Megan Margarita, MD  07/24/2014  8:59 AM

## 2014-07-24 NOTE — Progress Notes (Signed)
07/24/2014 4:18 PM Discharge AVS meds taken today and those due this evening reviewed.  Follow-up appointments and when to call md reviewed.  D/C IV and TELE.  Questions and concerns addressed.   D/C home per orders. [place]

## 2014-07-24 NOTE — Progress Notes (Signed)
    Underwent lexiscan nuclear stress test. Tolerated procedure well.  Perry Mount PA-C  [facility]

## 2014-07-24 NOTE — Telephone Encounter (Signed)
Patient was admitted to [facility] last night with chest pain and rule out for MI by enzymes.  She underwent nuclear stress test today showing no ischemia.  The nurse discharged her home before I could see her.  I have talked with her on the phone and discussed the results with her.  Patient needs to be worked in to see Dr. Angelena Form or PA extender next week.  She was also instructed to call us if she has any further CP.

## 2014-07-24 NOTE — Progress Notes (Signed)
UR completed 

## 2014-07-26 ENCOUNTER — Other Ambulatory Visit: Payer: Self-pay | Admitting: Family Medicine

## 2014-07-26 NOTE — Telephone Encounter (Signed)
Rx called in to requested pharmacy 

## 2014-07-26 NOTE — Telephone Encounter (Signed)
Last f/u 07/2014

## 2014-07-26 NOTE — Telephone Encounter (Signed)
Spoke with [name] and appt made for [name] to see Dr. Angelena Form on 07/28/14 at 2:45

## 2014-07-28 ENCOUNTER — Ambulatory Visit (INDEPENDENT_AMBULATORY_CARE_PROVIDER_SITE_OTHER): Payer: Medicare Other | Admitting: Cardiovascular Disease

## 2014-07-28 ENCOUNTER — Encounter: Payer: Self-pay | Admitting: Cardiovascular Disease

## 2014-07-28 ENCOUNTER — Other Ambulatory Visit: Payer: Self-pay | Admitting: Family Medicine

## 2014-07-28 VITALS — BP 132/74 | HR 69 | Ht 67.0 in | Wt 188.4 lb

## 2014-07-28 DIAGNOSIS — Z954 Presence of other heart-valve replacement: Secondary | ICD-10-CM

## 2014-07-28 DIAGNOSIS — E785 Hyperlipidemia, unspecified: Secondary | ICD-10-CM | POA: Diagnosis not present

## 2014-07-28 DIAGNOSIS — I10 Essential (primary) hypertension: Secondary | ICD-10-CM | POA: Diagnosis not present

## 2014-07-28 DIAGNOSIS — I2510 Atherosclerotic heart disease of native coronary artery without angina pectoris: Secondary | ICD-10-CM

## 2014-07-28 DIAGNOSIS — Z952 Presence of prosthetic heart valve: Secondary | ICD-10-CM

## 2014-07-28 DIAGNOSIS — E11319 Type 2 diabetes mellitus with unspecified diabetic retinopathy without macular edema: Secondary | ICD-10-CM | POA: Diagnosis not present

## 2014-07-28 NOTE — Progress Notes (Signed)
History of Present Illness: 69 y.o. female with a hx of anemia, HTN, HLD, DM, COPD, CAD s/p DES in 10/2007 to RCA and mid CFX and CABG May 2015 as well as AVR May 2015 here today for cardiac follow up. She was admitted 10/2013 at [facility] with chest pain. Echo demonstrated normal LVF, mod AI and mild AS. Myoview was negative for ischemia. She followed up with Dr. Ida Rogue in [place] with continued symptoms and was set up for [facility]. This demonstrated 3v CAD with severe prox LAD stenosis and ostial RCA. She was referred for CABG. Intraoperative TEE demonstrated mild AS and mod to severe AI. She underwent CABG with Dr. Cyndia Bent 10/12/13 (L-LAD, S-RCA) + AVR with a bioprosthetic valve (21 mm [employer]). Post op course was fairly uneventful. Post-op palpitations and seen in ED June 2015. F/u echo was ok with normal LVEF and normal AVR. She was seen in our office 12/03/13 by Richardson Dopp, PA-C and still having chest pain. Troponin was negative. D-dimer was elevated. Pt was sent to [facility] for CTA chest to exclude PE that evening. NO PE on chest CT but noted to have small left pleural effusion. Admitted to [facility] 07/26/14 with chest pain. Stress myoview with no ischemia. She does not tolerate statins or Zetia.   She returns today for follow up. She denies SOB, PND, edema or syncope. Mid chest wall pain that is clearly worsened with movement of her arms.   Primary Care Physician: Deborra Medina  Last Lipid Profile: Needs updating   Past Medical History  Diagnosis Date  . Allergic rhinitis   . Diverticulitis of colon   . GERD (gastroesophageal reflux disease)   . Chronic depression   . Hypercholesterolemia     intolerance of statins and niaspan  . OSA (obstructive sleep apnea)     mild, intolerant of cpap  . ACE-inhibitor cough   . Cataract   . Chronic headache   . Asthma, acute   . Hearing loss   . History of blood transfusion 2013  . Coronary artery disease     a. s/p CABG x2 (LIMA-LAD and SVG-RCA)   b. NST neg for ischemia 07/24/14  . Diabetes mellitus     type 2  . Anxiety   . PONV (postoperative nausea and vomiting)   . Thyroid disease   . Arthritis   . Anemia     iron deficiency anemia  . H/O aortic valve replacement     Past Surgical History  Procedure Laterality Date  . Cholecystectomy  2001  . Colectomy      lap sigmoid  . Tonsillectomy    . Bartholin gland cyst excision    . Tubal ligation    . Bladder suspension    . Breast cyst excision  1988    bilateral nonmalignant tumors, x3  . Vaginal delivery      3  . Thyroidectomy    . Abdominal hysterectomy w/ partial vaginactomy    . Appendectomy  1964  . Colonoscopy  2014    polyps found, 2 clamped off.  . Cardiac catheterization    . Coronary angioplasty  10/29/2007    Prox RCA & Mid Cx.  . Coronary artery bypass graft N/A 10/12/2013    Procedure: CORONARY ARTERY BYPASS GRAFT TIMES TWO;  Surgeon: [name], MD;  Location: [facility] OR;  Service: Open Heart Surgery;  Laterality: N/A;  . Aortic valve replacement N/A 10/12/2013    Procedure: AORTIC VALVE REPLACEMENT (  AVR);  Surgeon: [name], MD;  Location: [facility] OR;  Service: Open Heart Surgery;  Laterality: N/A;  . Cataract extraction w/ intraocular lens  implant, bilateral    . Abdominal hysterectomy    . Sternal wires removal N/A 04/13/2014    Procedure: STERNAL WIRES REMOVAL;  Surgeon: [name], MD;  Location: [facility] OR;  Service: Thoracic;  Laterality: N/A;  . Left heart catheterization with coronary angiogram N/A 10/09/2013    Procedure: LEFT HEART CATHETERIZATION WITH CORONARY ANGIOGRAM;  Surgeon: [name], MD;  Location: [facility] CATH LAB;  Service: Cardiovascular;  Laterality: N/A;    Current Outpatient Prescriptions  Medication Sig Dispense Refill  . albuterol (PROVENTIL HFA;VENTOLIN HFA) 108 (90 BASE) MCG/ACT inhaler Inhale 2 puffs into the lungs every 6 (six) hours as needed for wheezing or shortness of breath.    . Calcium-Vitamin D (CALTRATE  600 PLUS-VIT D PO) Take 2 tablets by mouth 2 (two) times daily.     Marland Kitchen FLUoxetine (PROZAC) 40 MG capsule Take 1 capsule (40 mg total) by mouth 2 (two) times daily. 180 capsule 0  . Insulin Lispro Prot & Lispro (HUMALOG MIX 75/25 KWIKPEN) (75-25) 100 UNIT/ML Kwikpen Inject 60 Units into the skin 2 (two) times daily.    . lansoprazole (PREVACID) 30 MG capsule Take 30 mg by mouth 2 (two) times daily before a meal.    . levothyroxine (SYNTHROID, LEVOTHROID) 150 MCG tablet Take 150 mcg by mouth daily before breakfast.    . ONETOUCH DELICA LANCETS 77A MISC USE TO CHECK BLOOD SUGAR THREE TIMES DAILY    . promethazine (PHENERGAN) 12.5 MG tablet TAKE 1 TO 2 TABLETS BY MOUTH EVERY 6 HOURS AS NEEDED FOR NASUEA & VOMITING 28 tablet 0  . traMADol (ULTRAM) 50 MG tablet TAKE ONE TABLET BY MOUTH EVERY SIX HOURS AS NEEDED 50 tablet 0  . traZODone (DESYREL) 100 MG tablet TAKE TWO TABLETS BY MOUTH NIGHTLY AT BEDTIME 60 tablet 1   No current facility-administered medications for this visit.    Allergies  Allergen Reactions  . Amitriptyline Other (See Comments)    Unknown reaction  . Benadryl [Diphenhydramine] Shortness Of Breath  . Demerol [Meperidine] Other (See Comments)    Unknown reaction  . Gabapentin Other (See Comments)    Unknown reaction  . Loratadine Other (See Comments)    Unknown reaction  . Meperidine Hcl Other (See Comments)    Unknown reaction  . Mirtazapine Other (See Comments)    Unknown reaction  . Olanzapine Other (See Comments)    Unknown reaction   . Voltaren [Diclofenac Sodium] Shortness Of Breath  . Zetia [Ezetimibe] Other (See Comments)    Weakness in legs, shakiness all over  . Ativan [Lorazepam] Other (See Comments)    Causes double vision at highter than .5 mg dose  . Atorvastatin Other (See Comments)    Muscle aches and weakness  . Budesonide-Formoterol Fumarate Other (See Comments)    Shakiness, tremors  . Bupropion Hcl Other (See Comments)    "cloud over me"  depression  . Caffeine Other (See Comments)    jitters  . Codeine Sulfate Other (See Comments)    Makes chest hurt like a heart attack  . Lisinopril Cough  . Metformin Nausea And Vomiting  . Mometasone Furoate Nausea And Vomiting  . Morphine Sulfate Other (See Comments)    Chest pain like a heart attack  . Other Other (See Comments)    Beta Blockers, reaction shortness of breath  .  Oxycodone-Acetaminophen Nausea And Vomiting  . Pioglitazone Other (See Comments)    Cannot take because of risk of bladder cancer  . Propoxyphene N-Acetaminophen Nausea And Vomiting  . Rosuvastatin Other (See Comments)    Muscle aches and weakness  . Shellfish Allergy Diarrhea  . Venlafaxine Other (See Comments)    Unknown reaction  . Zolpidem Tartrate Other (See Comments)     Jittery, diarrhea  . Latex Rash    History   Social History  . Marital Status: Married    Spouse Name: N/A  . Number of Children: 3  . Years of Education: N/A   Occupational History  . Retired     Quarry manager   Social History Main Topics  . Smoking status: Former Smoker -- 0.50 packs/day for 30 years    Types: Cigarettes    Quit date: 02/03/2008  . Smokeless tobacco: Never Used  . Alcohol Use: No  . Drug Use: No  . Sexual Activity: Not Currently   Other Topics Concern  . Not on file   Social History Narrative    Family History  Problem Relation Age of Onset  . Breast cancer Mother     breast  . Hypertension Father   . Cancer Father     mesothelioma  . Asthma Father   . Stroke Paternal Grandfather   . Heart disease      Review of Systems:  As stated in the HPI and otherwise negative.   BP 132/74 mmHg  Pulse 69  Ht 5\' 7"  (1.702 m)  Wt 188 lb 6.4 oz (85.458 kg)  BMI 29.50 kg/m2  Physical Examination: General: Well developed, well nourished, NAD HEENT: OP clear, mucus membranes moist SKIN: warm, dry. No rashes. Neuro: No focal deficits Musculoskeletal: Muscle strength 5/5 all ext Psychiatric: Mood  and affect normal Neck: No JVD, no carotid bruits, no thyromegaly, no lymphadenopathy. Lungs:Clear bilaterally, no wheezes, rhonci, crackles Cardiovascular: Regular rate and rhythm. Systolic murmurs, gallops or rubs. Abdomen:Soft. Bowel sounds present. Non-tender.  Extremities: No lower extremity edema. Pulses are 2 + in the bilateral DP/PT.  Assessment and Plan:   1. CAD: s/p recent CABG. Chronic chest wall pain. Stable. Continue ASA. She does not tolerate statins or beta blockers.   2. Aortic valve insufficiency: s/p AVR. Stable. Continue SBE prophylaxis.   3. Hypertension: Controlled. No changes.   4. Hyperlipidemia: She is statin intolerant. She did not tolerate Zetia. She has not tolerated any statins. She was seen in lipid clinic 07/13/14 and tried on low dose Pravastatin but did not tolerate due to muscle aches. She does not wish to try the new PCSK9 inhibitors. She does not wish to follow up in lipid clinic.

## 2014-07-28 NOTE — Patient Instructions (Signed)
Your physician wants you to follow-up in:  6 months. You will receive a reminder letter in the mail two months in advance. If you don't receive a letter, please call our office to schedule the follow-up appointment.   

## 2014-08-03 ENCOUNTER — Ambulatory Visit
Admit: 2014-08-03 | Disposition: A | Discharge status: Home / Self Care | Payer: Self-pay | Attending: Internal Medicine | Admitting: Internal Medicine

## 2014-08-03 ENCOUNTER — Encounter: Payer: Self-pay | Admitting: *Deleted

## 2014-08-04 ENCOUNTER — Encounter: Payer: Self-pay | Admitting: Family Medicine

## 2014-08-04 ENCOUNTER — Ambulatory Visit (INDEPENDENT_AMBULATORY_CARE_PROVIDER_SITE_OTHER): Payer: Medicare Other | Admitting: Family Medicine

## 2014-08-04 VITALS — BP 110/64 | HR 74 | Temp 97.9°F | Ht 67.0 in | Wt 190.8 lb

## 2014-08-04 DIAGNOSIS — M259 Joint disorder, unspecified: Secondary | ICD-10-CM | POA: Diagnosis not present

## 2014-08-04 DIAGNOSIS — M7551 Bursitis of right shoulder: Secondary | ICD-10-CM

## 2014-08-04 DIAGNOSIS — M25511 Pain in right shoulder: Secondary | ICD-10-CM | POA: Diagnosis not present

## 2014-08-04 DIAGNOSIS — I2510 Atherosclerotic heart disease of native coronary artery without angina pectoris: Secondary | ICD-10-CM

## 2014-08-04 DIAGNOSIS — M75101 Unspecified rotator cuff tear or rupture of right shoulder, not specified as traumatic: Secondary | ICD-10-CM | POA: Diagnosis not present

## 2014-08-04 DIAGNOSIS — M19019 Primary osteoarthritis, unspecified shoulder: Secondary | ICD-10-CM

## 2014-08-04 DIAGNOSIS — M7541 Impingement syndrome of right shoulder: Secondary | ICD-10-CM

## 2014-08-04 MED ORDER — METHYLPREDNISOLONE ACETATE 40 MG/ML IJ SUSP
20.0000 mg | Freq: Once | INTRAMUSCULAR | Status: AC
  Administered 2014-08-04: 20 mg via INTRA_ARTICULAR

## 2014-08-04 MED ORDER — METHYLPREDNISOLONE ACETATE 40 MG/ML IJ SUSP
80.0000 mg | Freq: Once | INTRAMUSCULAR | Status: AC
  Administered 2014-08-04: 80 mg via INTRA_ARTICULAR

## 2014-08-04 NOTE — Progress Notes (Signed)
Dr. Frederico Hamman T. Yari Szeliga, MD, [facility] [place], [address] Phone: [phone] Fax: [phone]  08/04/2014  Patient: [name], MRN: [id number], DOB: 04/01/1946, 69 y.o.  Primary Physician:  Arnette Norris, MD  Chief Complaint: Shoulder Pain  Subjective:   [name] is a 69 y.o. very pleasant female patient who presents with the following:  R > L shoulder pain.  R is the worst.    patient is very well-known, she has multiple musculoskeletal complaints including right greater than left shoulder pain. She points at the [facility] joint for the point of maximal tenderness.  She is having pain in abduction as well as internal rotation. She also has some pain at night in a T-shirt distribution.  Also the patient has some neck pain relatively chronically.  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    She had a complex open-heart surgery in May 2015, and she has had some difficulty since then. At that time she had 2 bypasses as well as valve replacement.  Past Medical History, Surgical History, Social History, Family History, Problem List, Medications, and Allergies have been reviewed and updated if relevant.  GEN: No fevers, chills. Nontoxic. Primarily MSK c/o today. MSK: Detailed in the HPI GI: tolerating PO intake without difficulty Neuro: No numbness, parasthesias, or tingling associated. Otherwise the pertinent positives of the ROS are noted above.   Objective:   BP 110/64 mmHg  Pulse 74  Temp(Src) 97.9 F (36.6 C) (Oral)  Ht 5\' 7"  (1.702 m)  Wt 190 lb 12 oz (86.524 kg)  BMI 29.87 kg/m2   GEN: WDWN, NAD, Non-toxic, Alert & Oriented x 3 HEENT: Atraumatic, Normocephalic.  Ears and Nose: No external deformity. EXTR: No clubbing/cyanosis/edema NEURO: Normal gait.  PSYCH: Normally interactive. Conversant. Not depressed or anxious appearing.  Calm demeanor.   Shoulder: B Inspection: No muscle  wasting or winging Ecchymosis/edema: neg  AC joint, scapula, clavicle: R > L markedly TTP Cervical spine: NT, full ROM Spurling's: neg Abduction: full, 5/5, painful arc of motion Flexion: full, 5/5 IR, full, lift-off: 5/5 ER at neutral: full, 5/5 AC crossover and compression: pos Neer: pos on R Hawkins: pos on R Drop Test: neg Empty Can: neg Supraspinatus insertion: NT Bicipital groove: NT Speed's: pos Yergason's: neg Sulcus sign: neg Scapular dyskinesis: none C5-T1 intact Sensation intact Grip 5/5   Radiology:  Assessment and Plan:   Right shoulder pain - Plan: methylPREDNISolone acetate (DEPO-MEDROL) injection 80 mg  AC joint arthropathy - Plan: methylPREDNISolone acetate (DEPO-MEDROL) injection 20 mg  Bursitis of shoulder, right [M75.51]  Rotator cuff impingement syndrome, right [M75.101]  Diagnostic Ultrasound Evaluation Terason t3000, MSK ultrasound, MSK probe Anatomy scanned: R AC joint Indication: Pain Findings:  Simple scan was done of the [facility] joint for needle placement.  The joint was seen. It was narrowed consistent with arthritis with some hyperechoic changes in the joint.   I confirmed that the patient could tolerate Depo-Medrol, and she has received multiple injections for me in the past.  [facility] Joint Injection, R Verbal consent was obtained from the patient. Risks, benefits, and alternatives have been reviewed including possible infection. The patient was prepped with Chloraprep. Ethyl Chloride used for anesthesia. Under sterile conditions, 1/2 cc of Lidocaine 1% and Depo-Medrol 20 mg directly injected into at the superior-lateral AC joint. The patient tolerated the procedure well and had decreased symptoms after injection. No complications.    SubAC  Injection, R Verbal consent was obtained from the patient. Risks (including rare infection), benefits, and alternatives were explained. Patient prepped with Chloraprep and Ethyl Chloride used for anesthesia. The  subacromial space was injected using the posterior approach. The patient tolerated the procedure well and had decreased pain post injection. No complications. Injection: 8 cc of Lidocaine 1% and Depo-Medrol 80 mg. Needle: 22 gauge    reviewed basic range of motion with both shoulders to decrease risk of frozen shoulder.  Follow-up: Return if symptoms worsen or fail to improve.  Signed,  Maud Deed. Ramone Gander, MD   Patient's Medications  New Prescriptions   No medications on file  Previous Medications   ALBUTEROL (PROVENTIL HFA;VENTOLIN HFA) 108 (90 BASE) MCG/ACT INHALER    Inhale 2 puffs into the lungs every 6 (six) hours as needed for wheezing or shortness of breath.   CALCIUM-VITAMIN D (CALTRATE 600 PLUS-VIT D PO)    Take 2 tablets by mouth 2 (two) times daily.    FLUOXETINE (PROZAC) 40 MG CAPSULE    Take 1 capsule (40 mg total) by mouth 2 (two) times daily.   INSULIN LISPRO PROT & LISPRO (HUMALOG MIX 75/25 KWIKPEN) (75-25) 100 UNIT/ML KWIKPEN    Inject 60 Units into the skin 2 (two) times daily.   LANSOPRAZOLE (PREVACID) 30 MG CAPSULE    Take 30 mg by mouth 2 (two) times daily before a meal.   LEVOTHYROXINE (SYNTHROID, LEVOTHROID) 150 MCG TABLET    Take 150 mcg by mouth daily before breakfast.   ONETOUCH DELICA LANCETS 49E MISC    USE TO CHECK BLOOD SUGAR THREE TIMES DAILY   PROMETHAZINE (PHENERGAN) 12.5 MG TABLET    TAKE 1 TO 2 TABLETS BY MOUTH  EVERY 6 HOURS AS NEEDED FOR  NASUEA & VOMITING   TRAMADOL (ULTRAM) 50 MG TABLET    TAKE ONE TABLET BY MOUTH EVERY SIX HOURS AS NEEDED   TRAZODONE (DESYREL) 100 MG TABLET    TAKE TWO TABLETS BY MOUTH NIGHTLY AT BEDTIME  Modified Medications   No medications on file  Discontinued Medications   PRAVASTATIN (PRAVACHOL) 20 MG TABLET

## 2014-08-04 NOTE — Progress Notes (Signed)
Pre visit review using our clinic review tool, if applicable. No additional management support is needed unless otherwise documented below in the visit note. 

## 2014-08-09 ENCOUNTER — Other Ambulatory Visit: Payer: Self-pay

## 2014-08-09 MED ORDER — LANSOPRAZOLE 30 MG PO CPDR: 30.0000 mg | DELAYED_RELEASE_CAPSULE | Freq: Two times a day (BID) | ORAL | Status: DC

## 2014-08-09 NOTE — Telephone Encounter (Signed)
[name] notified refill done.

## 2014-08-09 NOTE — Telephone Encounter (Signed)
Mr Bellissimo left v/m requesting refill to [facility] mail order pharmacy. Pt last seen f/u on 01/19/2014.Please advise.

## 2014-08-20 MED ORDER — LANSOPRAZOLE 30 MG PO CPDR: 30.0000 mg | DELAYED_RELEASE_CAPSULE | Freq: Two times a day (BID) | ORAL | Status: DC

## 2014-08-20 MED ORDER — FLUOXETINE HCL 40 MG PO CAPS: 40.0000 mg | ORAL_CAPSULE | Freq: Two times a day (BID) | ORAL | Status: DC

## 2014-08-20 NOTE — Addendum Note (Signed)
Addended by: Helene Shoe on: 08/20/2014 09:07 AM   Modules accepted: Orders

## 2014-08-20 NOTE — Telephone Encounter (Signed)
[name] mail order pharmacy changed and pt was not able to get lansoprazole at [facility] order; [name] request refill fluoxetine and lansoprazole to [place] home delivery. Advised done. Will schedule appt before rx is out.

## 2014-08-23 ENCOUNTER — Other Ambulatory Visit: Payer: Self-pay

## 2014-08-23 MED ORDER — TRAZODONE HCL 100 MG PO TABS: ORAL_TABLET | ORAL | Status: DC

## 2014-08-23 NOTE — Telephone Encounter (Signed)
Mr Herrle left v/m requesting refill trazodone to [facility]. Pt last seen by Dr Deborra Medina on 01/19/14. Pt has medicare wellness scheduled 08/25/14.

## 2014-08-25 ENCOUNTER — Encounter: Payer: Self-pay | Admitting: Family Medicine

## 2014-08-25 ENCOUNTER — Ambulatory Visit (INDEPENDENT_AMBULATORY_CARE_PROVIDER_SITE_OTHER): Payer: Medicare Other | Admitting: Family Medicine

## 2014-08-25 VITALS — BP 110/54 | HR 70 | Temp 97.8°F | Resp 18 | Wt 189.8 lb

## 2014-08-25 DIAGNOSIS — E039 Hypothyroidism, unspecified: Secondary | ICD-10-CM

## 2014-08-25 DIAGNOSIS — F418 Other specified anxiety disorders: Secondary | ICD-10-CM

## 2014-08-25 DIAGNOSIS — E119 Type 2 diabetes mellitus without complications: Secondary | ICD-10-CM | POA: Diagnosis not present

## 2014-08-25 DIAGNOSIS — I10 Essential (primary) hypertension: Secondary | ICD-10-CM | POA: Diagnosis not present

## 2014-08-25 DIAGNOSIS — E7800 Pure hypercholesterolemia, unspecified: Secondary | ICD-10-CM

## 2014-08-25 DIAGNOSIS — E162 Hypoglycemia, unspecified: Secondary | ICD-10-CM | POA: Insufficient documentation

## 2014-08-25 DIAGNOSIS — I2510 Atherosclerotic heart disease of native coronary artery without angina pectoris: Secondary | ICD-10-CM

## 2014-08-25 DIAGNOSIS — R110 Nausea: Secondary | ICD-10-CM | POA: Diagnosis not present

## 2014-08-25 DIAGNOSIS — E118 Type 2 diabetes mellitus with unspecified complications: Secondary | ICD-10-CM

## 2014-08-25 DIAGNOSIS — Z0000 Encounter for general adult medical examination without abnormal findings: Secondary | ICD-10-CM | POA: Insufficient documentation

## 2014-08-25 MED ORDER — PROMETHAZINE HCL 25 MG/ML IJ SOLN: 25.0000 mg | Freq: Once | INTRAMUSCULAR | Status: DC

## 2014-08-25 MED ORDER — PROMETHAZINE HCL 12.5 MG PO TABS: ORAL_TABLET | ORAL | Status: DC

## 2014-08-25 MED ORDER — PROMETHAZINE HCL 25 MG/ML IJ SOLN
25.0000 mg | Freq: Once | INTRAMUSCULAR | Status: AC
  Administered 2014-08-25: 25 mg via INTRAMUSCULAR

## 2014-08-25 MED ORDER — ZOSTER VACCINE LIVE 19400 UNT/0.65ML ~~LOC~~ SOLR: 0.6500 mL | Freq: Once | SUBCUTANEOUS | Status: DC

## 2014-08-25 NOTE — Addendum Note (Signed)
Addended by: Kyra Manges on: 08/25/2014 02:01 PM   Modules accepted: Orders

## 2014-08-25 NOTE — Assessment & Plan Note (Signed)
See above.  Likely multifactorial. IM phenergan 25 mg given today. Fluctuating blood sugars, low blood pressures likely contributing. Advised cutting back on insulin. Drink fluids when she gets home and to keep [place] updated.  Not taking any antihypertensives currently. The patient indicates understanding of these issues and agrees with the plan.

## 2014-08-25 NOTE — Assessment & Plan Note (Signed)
The patients weight, height, BMI and visual acuity have been recorded in the chart I have made referrals, counseling and provided education to the patient based review of the above and I have provided the pt with a written personalized care plan for preventive services.  Declining mammograms Rx given to her for zostavax- she will check with her insurance company about coverage.

## 2014-08-25 NOTE — Patient Instructions (Addendum)
Good to see you. Check with your insurance to see if they will cover the shingles shot.  I would suggest you call Dr. Gabriel Carina or go ahead and decrease your insulin to 35 units twice daily.

## 2014-08-25 NOTE — Assessment & Plan Note (Signed)
Brittle now with hypoglycemia. I recommended that she call Dr. Gabriel Carina (she sees her next week) but to go ahead and decrease her insulin to 35 units twice daily.

## 2014-08-25 NOTE — Progress Notes (Signed)
Subjective:   Patient ID: [name], female    DOB: 03/12/1946, 69 y.o.   MRN: [id number]  [name] is a pleasant 69 y.o. year old female who presents to clinic today with Medicare Wellness  on 08/25/2014  HPI: I have personally reviewed the Medicare Annual Wellness questionnaire and have noted 1. The patient's medical and social history 2. Their use of alcohol, tobacco or illicit drugs 3. Their current medications and supplements 4. The patient's functional ability including ADL's, fall risks, home safety risks and hearing or visual             impairment. 5. Diet and physical activities 6. Evidence for depression or mood disorders  End of life wishes discussed and updated in Social History.  The roster of all physicians providing medical care to patient - is listed in the Snapshot section of the chart.  Influenza vaccine 02/13/14 Pneumovax 08/08/07 prevnar 13 07/19/14 Td 08/22/07 Colonoscopy 11/08/12- Dr. Vira Agar Eye exam done two weeks ago. Refusing mammograms  Failed right hearing screen- long term issue- had tried hearing aids but couldn't tolerate them.  DM- followed by Dr. Gabriel Carina.   Last saw her on 03/31/14- note reviewed. No changes made.  a1c was 6.3 at that OV.  Sees her again next week.  She has been having episodes of hypoglycemia.  She has cut back her Humalog to 40 twice daily on her own. More nauseated and sweaty.  Very nauseated today.  FSBS have been fluctuating.  Was 111 this morning.  Lab Results  Component Value Date   HGBA1C 6.3* 03/24/2014   Hypothyroidism- also followed by Dr. Gabriel Carina- note reviewed from 03/24/14.  CAD- s/p recent CABG,  followed by Darlina Guys.  Last saw him on 07/28/14- note reviewed. No changes made.  She has refused tx of lipids including statins, zetia, and referral to lipid clinic. Lab Results  Component Value Date   CHOL 220* 07/07/2014   HDL 30.20* 07/07/2014   LDLCALC 115* 06/23/2008   LDLDIRECT 127.0 07/07/2014   TRIG  329.0* 07/07/2014   CHOLHDL 7 07/07/2014   Lab Results  Component Value Date   ALT 11 07/07/2014   AST 13 07/07/2014   ALKPHOS 101 07/07/2014   BILITOT 0.3 07/07/2014   Lab Results  Component Value Date   CREATININE 0.88 07/24/2014   Lab Results  Component Value Date   TSH 1.62 07/07/2014   Lab Results  Component Value Date   WBC 8.2 07/24/2014   HGB 12.9 07/24/2014   HCT 39.2 07/24/2014   MCV 86.9 07/24/2014   PLT 160 07/24/2014   Current Outpatient Prescriptions on File Prior to Visit  Medication Sig Dispense Refill  . albuterol (PROVENTIL HFA;VENTOLIN HFA) 108 (90 BASE) MCG/ACT inhaler Inhale 2 puffs into the lungs every 6 (six) hours as needed for wheezing or shortness of breath.    . Calcium-Vitamin D (CALTRATE 600 PLUS-VIT D PO) Take 2 tablets by mouth 2 (two) times daily.     Marland Kitchen FLUoxetine (PROZAC) 40 MG capsule Take 1 capsule (40 mg total) by mouth 2 (two) times daily. 180 capsule 0  . Insulin Lispro Prot & Lispro (HUMALOG MIX 75/25 KWIKPEN) (75-25) 100 UNIT/ML Kwikpen Inject 60 Units into the skin 2 (two) times daily.    . lansoprazole (PREVACID) 30 MG capsule Take 1 capsule (30 mg total) by mouth 2 (two) times daily before a meal. 180 capsule 1  . levothyroxine (SYNTHROID, LEVOTHROID) 150 MCG tablet Take 150 mcg by  mouth daily before breakfast.    . ONETOUCH DELICA LANCETS 54O MISC USE TO CHECK BLOOD SUGAR THREE TIMES DAILY    . promethazine (PHENERGAN) 12.5 MG tablet TAKE 1 TO 2 TABLETS BY MOUTH  EVERY 6 HOURS AS NEEDED FOR  NASUEA & VOMITING 28 tablet 5  . traMADol (ULTRAM) 50 MG tablet TAKE ONE TABLET BY MOUTH EVERY SIX HOURS AS NEEDED 50 tablet 0  . traZODone (DESYREL) 100 MG tablet TAKE TWO TABLETS BY MOUTH NIGHTLY AT BEDTIME 60 tablet 0   No current facility-administered medications on file prior to visit.    Allergies  Allergen Reactions  . Amitriptyline Other (See Comments)    Unknown reaction  . Benadryl [Diphenhydramine] Shortness Of Breath  . Demerol  [Meperidine] Other (See Comments)    Unknown reaction  . Gabapentin Other (See Comments)    Unknown reaction  . Loratadine Other (See Comments)    Unknown reaction  . Meperidine Hcl Other (See Comments)    Unknown reaction  . Mirtazapine Other (See Comments)    Unknown reaction  . Olanzapine Other (See Comments)    Unknown reaction   . Voltaren [Diclofenac Sodium] Shortness Of Breath  . Zetia [Ezetimibe] Other (See Comments)    Weakness in legs, shakiness all over  . Ativan [Lorazepam] Other (See Comments)    Causes double vision at highter than .5 mg dose  . Atorvastatin Other (See Comments)    Muscle aches and weakness  . Budesonide-Formoterol Fumarate Other (See Comments)    Shakiness, tremors  . Bupropion Hcl Other (See Comments)    "cloud over me" depression  . Caffeine Other (See Comments)    jitters  . Codeine Sulfate Other (See Comments)    Makes chest hurt like a heart attack  . Lisinopril Cough  . Metformin Nausea And Vomiting  . Mometasone Furoate Nausea And Vomiting  . Morphine Sulfate Other (See Comments)    Chest pain like a heart attack  . Other Other (See Comments)    Beta Blockers, reaction shortness of breath  . Oxycodone-Acetaminophen Nausea And Vomiting  . Pioglitazone Other (See Comments)    Cannot take because of risk of bladder cancer  . Propoxyphene N-Acetaminophen Nausea And Vomiting  . Rosuvastatin Other (See Comments)    Muscle aches and weakness  . Shellfish Allergy Diarrhea  . Venlafaxine Other (See Comments)    Unknown reaction  . Zolpidem Tartrate Other (See Comments)     Jittery, diarrhea  . Latex Rash    Past Medical History  Diagnosis Date  . Allergic rhinitis   . Diverticulitis of colon   . GERD (gastroesophageal reflux disease)   . Chronic depression   . Hypercholesterolemia     intolerance of statins and niaspan  . OSA (obstructive sleep apnea)     mild, intolerant of cpap  . ACE-inhibitor cough   . Cataract   . Chronic  headache   . Asthma, acute   . Hearing loss   . History of blood transfusion 2013  . Coronary artery disease     a. s/p CABG x2 (LIMA-LAD and SVG-RCA)  b. NST neg for ischemia 07/24/14  . Diabetes mellitus     type 2  . Anxiety   . PONV (postoperative nausea and vomiting)   . Thyroid disease   . Arthritis   . Anemia     iron deficiency anemia  . H/O aortic valve replacement     Past Surgical History  Procedure Laterality Date  .  Cholecystectomy  2001  . Colectomy      lap sigmoid  . Tonsillectomy    . Bartholin gland cyst excision    . Tubal ligation    . Bladder suspension    . Breast cyst excision  1988    bilateral nonmalignant tumors, x3  . Vaginal delivery      3  . Thyroidectomy    . Abdominal hysterectomy w/ partial vaginactomy    . Appendectomy  1964  . Colonoscopy  2014    polyps found, 2 clamped off.  . Cardiac catheterization    . Coronary angioplasty  10/29/2007    Prox RCA & Mid Cx.  . Coronary artery bypass graft N/A 10/12/2013    Procedure: CORONARY ARTERY BYPASS GRAFT TIMES TWO;  Surgeon: [name], MD;  Location: [facility] OR;  Service: Open Heart Surgery;  Laterality: N/A;  . Aortic valve replacement N/A 10/12/2013    Procedure: AORTIC VALVE REPLACEMENT (AVR);  Surgeon: [name], MD;  Location: [facility];  Service: Open Heart Surgery;  Laterality: N/A;  . Cataract extraction w/ intraocular lens  implant, bilateral    . Abdominal hysterectomy    . Sternal wires removal N/A 04/13/2014    Procedure: STERNAL WIRES REMOVAL;  Surgeon: [name], MD;  Location: [facility] OR;  Service: Thoracic;  Laterality: N/A;  . Left heart catheterization with coronary angiogram N/A 10/09/2013    Procedure: LEFT HEART CATHETERIZATION WITH CORONARY ANGIOGRAM;  Surgeon: [name], MD;  Location[facility] C[facility] LAB;  Service: Cardiovascular;  Laterality: N/A;    Family History  Problem Relation Age of Onset  . Breast cancer Mother     breast  . Hypertension Father   .  Cancer Father     mesothelioma  . Asthma Father   . Stroke Paternal Grandfather   . Heart disease      History   Social History  . Marital Status: Married    Spouse Name: N/A  . Number of Children: 3  . Years of Education: N/A   Occupational History  . Retired     Quarry manager   Social History Main Topics  . Smoking status: Former Smoker -- 0.50 packs/day for 30 years    Types: Cigarettes    Quit date: 02/03/2008  . Smokeless tobacco: Never Used  . Alcohol Use: No  . Drug Use: No  . Sexual Activity: Not Currently   Other Topics Concern  . Not on file   Social History Narrative   The PMH, PSH, Social History, Family History, Medications, and allergies have been reviewed in [facility], and have been updated if relevant.   Review of Systems  Constitutional: Positive for fatigue. Negative for fever.  HENT: Negative.   Respiratory: Negative.  Negative for wheezing.   Cardiovascular: Negative.   Gastrointestinal: Positive for nausea. Negative for vomiting and diarrhea.  Genitourinary: Negative.   Musculoskeletal: Negative.   Skin: Negative.   Allergic/Immunologic: Negative.   Neurological: Negative.   Hematological: Negative.   Psychiatric/Behavioral: Negative.   All other systems reviewed and are negative.      Objective:    BP 110/54 mmHg  Pulse 70  Temp(Src) 97.8 F (36.6 C) (Oral)  Resp 18  Wt 189 lb 12.8 oz (86.093 kg)  SpO2 95%  Wt Readings from Last 3 Encounters:  08/25/14 189 lb 12.8 oz (86.093 kg)  08/04/14 190 lb 12 oz (86.524 kg)  07/28/14 188 lb 6.4 oz (85.458 kg)    Physical Exam  General:  Well-developed,well-nourished,in no acute distress; alert,appropriate and cooperative throughout examination Head:  normocephalic and atraumatic.   Eyes:  vision grossly intact, pupils equal, pupils round, and pupils reactive to light.   Ears:  R ear normal and L ear normal.   Nose:  no external deformity.   Mouth:  good dentition.   Neck:  No deformities,  masses, or tenderness noted.   Lungs:  Normal respiratory effort, chest expands symmetrically. Lungs are clear to auscultation, no crackles or wheezes. Heart:  Normal rate and regular rhythm. +murmur Abdomen:  Bowel sounds positive,abdomen soft and non-tender without masses, organomegaly or hernias noted. Msk:  No deformity or scoliosis noted of thoracic or lumbar spine.   Extremities:  No clubbing, cyanosis, edema, or deformity noted with normal full range of motion of all joints.   Neurologic:  alert & oriented X3 and gait normal.   Skin:  Intact without suspicious lesions or rashes Psych:  Cognition and judgment appear intact. Alert and cooperative with normal attention span and concentration. No apparent delusions, illusions, hallucinations       Assessment & Plan:   Type 2 diabetes mellitus with complication  Hypertension  Depression with anxiety  Hypothyroidism, unspecified hypothyroidism type  Medicare annual wellness visit, subsequent No Follow-up on file.

## 2014-08-25 NOTE — Addendum Note (Signed)
Addended by: Kyra Manges on: 08/25/2014 02:06 PM   Modules accepted: Orders

## 2014-08-25 NOTE — Assessment & Plan Note (Signed)
Followed by Dr. Gabriel Carina. Had lab work done this am for her follow up with Dr. Gabriel Carina next week.

## 2014-08-26 ENCOUNTER — Telehealth: Payer: Self-pay | Admitting: Family Medicine

## 2014-08-26 NOTE — Telephone Encounter (Signed)
emmi emailed °

## 2014-09-01 ENCOUNTER — Other Ambulatory Visit: Payer: Self-pay

## 2014-09-01 MED ORDER — TRAMADOL HCL 50 MG PO TABS: 50.0000 mg | ORAL_TABLET | Freq: Four times a day (QID) | ORAL | Status: DC | PRN

## 2014-09-01 NOTE — Telephone Encounter (Signed)
Mr Olsson left v/m requesting refill tramadol to [facility] [address]. Please advise. Pt last annual exam 08/25/14.

## 2014-09-01 NOTE — Telephone Encounter (Signed)
Rx called in to requested pharmacy 

## 2014-09-10 DIAGNOSIS — J301 Allergic rhinitis due to pollen: Secondary | ICD-10-CM | POA: Diagnosis not present

## 2014-09-10 DIAGNOSIS — E039 Hypothyroidism, unspecified: Secondary | ICD-10-CM | POA: Diagnosis not present

## 2014-09-10 DIAGNOSIS — E119 Type 2 diabetes mellitus without complications: Secondary | ICD-10-CM | POA: Diagnosis not present

## 2014-09-10 DIAGNOSIS — Z794 Long term (current) use of insulin: Secondary | ICD-10-CM | POA: Diagnosis not present

## 2014-09-13 DIAGNOSIS — J301 Allergic rhinitis due to pollen: Secondary | ICD-10-CM | POA: Diagnosis not present

## 2014-09-15 ENCOUNTER — Ambulatory Visit (INDEPENDENT_AMBULATORY_CARE_PROVIDER_SITE_OTHER): Payer: Medicare Other | Admitting: Family Medicine

## 2014-09-15 ENCOUNTER — Encounter: Payer: Self-pay | Admitting: Family Medicine

## 2014-09-15 ENCOUNTER — Ambulatory Visit (INDEPENDENT_AMBULATORY_CARE_PROVIDER_SITE_OTHER)
Admission: RE | Admit: 2014-09-15 | Discharge: 2014-09-15 | Disposition: A | Discharge status: Home / Self Care | Payer: Medicare Other | Source: Ambulatory Visit | Attending: Family Medicine | Admitting: Family Medicine

## 2014-09-15 VITALS — BP 108/62 | HR 69 | Temp 98.3°F | Ht 67.0 in | Wt 191.5 lb

## 2014-09-15 DIAGNOSIS — M259 Joint disorder, unspecified: Secondary | ICD-10-CM

## 2014-09-15 DIAGNOSIS — M25511 Pain in right shoulder: Secondary | ICD-10-CM

## 2014-09-15 DIAGNOSIS — M75101 Unspecified rotator cuff tear or rupture of right shoulder, not specified as traumatic: Secondary | ICD-10-CM

## 2014-09-15 DIAGNOSIS — M7551 Bursitis of right shoulder: Secondary | ICD-10-CM

## 2014-09-15 DIAGNOSIS — M19019 Primary osteoarthritis, unspecified shoulder: Secondary | ICD-10-CM

## 2014-09-15 DIAGNOSIS — M7541 Impingement syndrome of right shoulder: Secondary | ICD-10-CM

## 2014-09-15 DIAGNOSIS — I2510 Atherosclerotic heart disease of native coronary artery without angina pectoris: Secondary | ICD-10-CM | POA: Diagnosis not present

## 2014-09-15 NOTE — Patient Instructions (Signed)

## 2014-09-15 NOTE — Progress Notes (Signed)
Pre visit review using our clinic review tool, if applicable. No additional management support is needed unless otherwise documented below in the visit note. 

## 2014-09-15 NOTE — Progress Notes (Signed)
Dr. Frederico Hamman T. Cherrie Franca, MD, [facility] [place], [address] Phone: [phone] Fax: [phone]  09/15/2014  Patient: [name], MRN: [id number], DOB: 11/22/1945, 69 y.o.  Primary Physician:  Arnette Norris, MD  Chief Complaint: Follow-up  Subjective:   [name] is a 69 y.o. very pleasant female patient who presents with the following:  F/u R > L shoulder.  Injection only lasted 3 days.  While last office visit, I did both an before meals joint injection and a subacromial injection. The patient had some great relief of symptoms, but unfortunately that lasted for only 3 days. She continues to have some pain. I recommended physical therapy on her last office visit and some home therapy, which he never did any of this.  She still has pretty significant pain complaints. Open-heart surgery noted recently.  08/04/2014 Last OV with Owens Loffler, MD  R > L shoulder pain.  R is the worst.    patient is very well-known, she has multiple musculoskeletal complaints including right greater than left shoulder pain. She points at the [facility] joint for the point of maximal tenderness.  She is having pain in abduction as well as internal rotation. She also has some pain at night in a T-shirt distribution.  Also the patient has some neck pain relatively chronically.  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    She had a complex open-heart surgery in May 2015, and she has had some difficulty since then. At that time she had 2 bypasses as well as valve replacement.  Past Medical History, Surgical History, Social History, Family History, Problem List, Medications, and Allergies have been reviewed and updated if relevant.  GEN: No fevers, chills. Nontoxic. Primarily MSK c/o today. MSK: Detailed in the HPI GI: tolerating PO intake without difficulty Neuro: No numbness, parasthesias, or tingling associated. Otherwise  the pertinent positives of the ROS are noted above.   Objective:   BP 108/62 mmHg  Pulse 69  Temp(Src) 98.3 F (36.8 C) (Oral)  Ht 5\' 7"  (1.702 m)  Wt 191 lb 8 oz (86.864 kg)  BMI 29.99 kg/m2   GEN: WDWN, NAD, Non-toxic, Alert & Oriented x 3 HEENT: Atraumatic, Normocephalic.  Ears and Nose: No external deformity. EXTR: No clubbing/cyanosis/edema NEURO: Normal gait.  PSYCH: Normally interactive. Conversant. Not depressed or anxious appearing.  Calm demeanor.   Shoulder: B Inspection: No muscle wasting or winging Ecchymosis/edema: neg  AC joint, scapula, clavicle: R > L markedly TTP Cervical spine: NT, full ROM Spurling's: neg Abduction: full, 4+/5, painful arc of motion Flexion: full, 5/5 IR, full, lift-off: 5/5 ER at neutral: full, 5/5 AC crossover and compression: pos Neer: pos on R Hawkins: pos on R Drop Test: neg Empty Can: neg Supraspinatus insertion: NT Bicipital groove: NT Speed's: pos Yergason's: neg Sulcus sign: neg Scapular dyskinesis: none C5-T1 intact Sensation intact Grip 5/5   Radiology:  Assessment and Plan:   Right shoulder pain  Bursitis of shoulder, right [M75.51]  Rotator cuff impingement syndrome, right [M75.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 She did not have a very good response to combined acromioclavicular as well as subacromial injection. We talked about different options. At this point, given her overall state of health and relatively recent valve replacement and bypass grafting, I  really cautioned her to continue conservative management at this point.  Recommended formal physical therapy and ongoing home exercise program. Recommended getting into the common and her daughter does have a pool.  Follow-up: 2 mo  Signed,  Prince Couey T. Halford Goetzke, MD   Patient's Medications  New Prescriptions   No  medications on file  Previous Medications   ALBUTEROL (PROVENTIL HFA;VENTOLIN HFA) 108 (90 BASE) MCG/ACT INHALER    Inhale 2 puffs into the lungs every 6 (six) hours as needed for wheezing or shortness of breath.   CALCIUM-VITAMIN D (CALTRATE 600 PLUS-VIT D PO)    Take 2 tablets by mouth 2 (two) times daily.    FLUOXETINE (PROZAC) 40 MG CAPSULE    Take 1 capsule (40 mg total) by mouth 2 (two) times daily.   INSULIN LISPRO PROT & LISPRO (HUMALOG MIX 75/25 KWIKPEN) (75-25) 100 UNIT/ML KWIKPEN    Inject 60 Units into the skin 2 (two) times daily.   LANSOPRAZOLE (PREVACID) 30 MG CAPSULE    Take 1 capsule (30 mg total) by mouth 2 (two) times daily before a meal.   LEVOTHYROXINE (SYNTHROID, LEVOTHROID) 150 MCG TABLET    Take 150 mcg by mouth daily before breakfast.   OMEGA-3 FATTY ACIDS (FISH OIL) 1200 MG CAPS    Take by mouth.   ONETOUCH DELICA LANCETS 25D MISC    USE TO CHECK BLOOD SUGAR THREE TIMES DAILY   PROMETHAZINE (PHENERGAN) 12.5 MG TABLET    TAKE 1 TO 2 TABLETS BY MOUTH  EVERY 6 HOURS AS NEEDED FOR  NASUEA & VOMITING   TRAMADOL (ULTRAM) 50 MG TABLET    Take 1 tablet (50 mg total) by mouth every 6 (six) hours as needed.   TRAZODONE (DESYREL) 100 MG TABLET    TAKE TWO TABLETS BY MOUTH NIGHTLY AT BEDTIME  Modified Medications   No medications on file  Discontinued Medications   ZOSTER VACCINE LIVE, PF, (ZOSTAVAX) 66440 UNT/0.65ML INJECTION    Inject 19,400 Units into the skin once.

## 2014-09-20 ENCOUNTER — Telehealth: Payer: Self-pay | Admitting: Family Medicine

## 2014-09-20 NOTE — Telephone Encounter (Signed)
Lm on pts vm and informed her Rx was sent to pharmacy 08/2014 #[id number]

## 2014-09-20 NOTE — Telephone Encounter (Signed)
Patient takes promethazine on a daily basis and her pharmacy is not filling this because they need authorization from [place] to fill this.  [phone] husband's number

## 2014-09-21 ENCOUNTER — Other Ambulatory Visit: Payer: Self-pay | Admitting: *Deleted

## 2014-09-21 MED ORDER — TRAZODONE HCL 100 MG PO TABS: ORAL_TABLET | ORAL | Status: DC

## 2014-09-21 NOTE — Telephone Encounter (Signed)
[name] left v/m and he received v/m on 04/18/*16; pt needs prior auth for promethazine and [name] requested call to [facility] at [phone].

## 2014-09-21 NOTE — Telephone Encounter (Signed)
Attempted to contact [facility]. Unable to wait increased hold time due to being in clinic. Spoke with pt and requested she contact pharmacy or insurance and have them to fax over form for completion.

## 2014-09-22 ENCOUNTER — Other Ambulatory Visit: Payer: Medicare Other

## 2014-09-24 ENCOUNTER — Ambulatory Visit: Payer: Medicare Other | Admitting: Pharmacist

## 2014-09-24 NOTE — H&P (Signed)
PATIENT NAME:  [name], [name] MR#:  [id number] DATE OF BIRTH:  07-07-1945  DATE OF ADMISSION:  11/08/2012  REFERRING PHYSICIAN:  Dr. Gaylyn Cheers.  PRIMARY CARE PHYSICIAN:  Dr. Deborra Medina at [facility].   CHIEF COMPLAINT:  Lower gastrointestinal bleed.   HISTORY OF PRESENT ILLNESS:  This is a 69 year old female with significant past medical history of diabetes, osteoarthritis, coronary artery disease, hypothyroidism, hyperlipidemia, and COPD and iron deficiency anemia, who had colonoscopy done on June 5th by Dr. Vira Agar, where she had an 8 mm polyp in the distal sigmoid colon which was resected and retrieved. The patient went home after procedure. She was having a headache where she did take BC powder, where she presents today with significant lower GI bleed.  In ED, the patient's hemoglobin dropped from 11.8 to 9.8, where she had emergent colonoscopy done by Dr. Vira Agar, where she did have ulcer in the descending colon with a lot of blood suctioned from the colon with some bleeding which was present, where the area was injected with epinephrine and was clipped. The patient was intubated during the procedure, as well she was hypotensive where she was started on phenylephrine drip, where medicine was consulted to evaluate the patient for admission and need for CCU admission where she is to remain intubated over the night hopefully where she can be successfully extubated in the a.m. The patient currently receiving her second unit of blood transfusion, as well currently she came off the phenylephrine drip. The patient is currently sedated, cannot give any history.  History was obtained from medical staff.   PAST MEDICAL HISTORY: 1.  Obstructive sleep apnea, intolerant to CPAP.  2.  Coronary artery disease, status post stent in 2009.  3.  Former tobacco use, quit in 2008.  4.  Dyslipidemia.  5.  Brittle diabetes, on insulin.  6.  Depression and anxiety.  7.  History of shingles.  8.   Diverticulosis with history of diverticulitis requiring colectomy.  9.  Hypothyroidism, history of thyroid goiter status post thyroidectomy in August 2011.   PAST SURGICAL HISTORY:  1.  Colectomy.  2.  Hysterectomy.  3.  Cholecystectomy.  4.  Appendectomy.  5.  Thyroidectomy.  6.  Bilateral breast surgery for fibrocystic breast disease.   ALLERGIES:  1.  BETA-BLOCKER.  2.  CODEINE.  3.  DARVOCET. 4.  DEMEROL.  5.  MORPHINE.  6.  PERCOCET.  7.  SHELLFISH.  8.  LATEX.   HOME MEDICATIONS: 1.  Aspirin 81 mg daily.  2.  Fluoxetine 40 mg oral 2 times a day.  3.  Synthroid 150 mcg oral daily.  4.  Trazodone 100 mg 2 tablets at bedtime.  5.  Lansoprazole 30 mg oral daily as needed.  6.  Calcium with vitamin D 1 tablet 2 times a day.  7.  Humalog mix 75/25 112 units in the morning and 100 units subcu twice a day.  8.  Lasix 40 mg daily as needed.  9.  Sublingual nitroglycerin as needed for chest pain.  10.  Tramadol capsule ER 150 mg every 6 hours as needed.   FAMILY HISTORY:  Significant for diabetes, heart disease, hypertension, and stroke.   SOCIAL HISTORY:  Lives with her husband, quit smoking in 2008.  No history of drug or alcohol use.   REVIEW OF SYSTEMS:  Currently unobtainable as the patient is intubated on sedation.   PHYSICAL EXAMINATION: VITAL SIGNS:  On the tele monitor patient's heart rate is 130,  blood pressure 190/107, , saturating 100%, respiratory rate 30 breaths per minute.  GENERAL:  Obese female intubated, sedated, currently on vent support.  HEENT:  Head atraumatic, normocephalic.  Pupils are equal and reactive to light.  Pink conjunctivae.  Anicteric sclerae.  Intubated.  NECK:  Supple.  No thyromegaly.  No JVD.  CHEST:  Good air entry bilaterally.  No wheezing, rales or rhonchi.  CARDIOVASCULAR:  S1, S2 heard.  Tachycardic, had mild systolic ejection murmur.  ABDOMEN:  Obese, soft, nontender, nondistended.  Bowel sounds present.  EXTREMITIES:  Mild  pitting edema.  No clubbing.  No cyanosis.  Dorsalis pedis pulse +2 bilaterally.  PSYCHIATRIC:  Unable to evaluate secondary to sedation.   NEUROLOGIC:  Unable to evaluate secondary to sedation.  SKIN:  Normal skin turgor.  Warm and dry.  LYMPHATICS:  No cervical or supraclavicular lymph node enlargement.   PERTINENT LABORATORY DATA:  Glucose 188, BUN 8, creatinine 0.84, sodium 139, potassium 3.5, chloride 106, CO2 24, total protein 5.7, albumin 2.3, total bilirubin 0.3, alkaline phosphatase 104, AST 16, ALT 18, white blood cells 12.9, hemoglobin 9.8, hematocrit 29.1, platelets 186.  ABG showing pH of 7.28, pCO2 44, pO2 67, FiO2 40.  INR 1.2.  Chest x-ray is poor quality because it is portable, showing some left side haziness, unclear if it is infiltrate or volume overload, and showing ET tube in trachea.   ASSESSMENT AND PLAN: 1.  Lower gastrointestinal bleed, the patient is status post colonoscopy yesterday with polypectomy.  She took [facility] powder yesterday at home where she presents with significant lower gastrointestinal bleed, status post emergent colonoscopy with active bleed requiring a clipping and epinephrine injection at the previous polypectomy site. The patient will be admitted to CCU, we will monitor her hemoglobin every 6 hours. She is receiving her second unit of packed red blood cells, will be on IV Protonix 40 every 12 hours, Dr. Vira Agar is on board with [place]. Currently she is off pressors.  2.  Respiratory failure, the patient is currently vented.  We will continue with current vent support,  Her ABG showing metabolic acidosis, good pCO2, so we will continue with current vent settings.  Hopefully the patient will be able to be extubated in a.m.  We will have her on propofol for sedation and as needed Ativan.  We will repeat ABG in a.m.  3.  Metabolic acidosis, the patient has pH of 7.28, it is an anion gap metabolic acidosis.  We will check salicylate acid level. The patient as well might be  septic giving her significant leukocytosis.  We will repeat ABG in the morning.  Given the fact there is questionable left-sided infiltrate and the patient is critical, we will start her empirically on IV vancomycin and Zosyn for possible left lung pneumonia.  As well, we will check her urinalysis.  4.  History of diabetes mellitus.  We will have her on insulin sliding scale.  5.  History of chronic obstructive pulmonary disease.  The patient does not have any significant wheezing.  We will continue her on nebulizer treatment.  6.  Hypothyroidism.  We will resume by mouth Synthroid once the patient is extubated. If the patient does not get extubated in a.m., we will switch her dose to IV.  7.  History of coronary artery disease.  We will check EKG at baseline. Once the patient is stable she can be resumed back on aspirin as an outpatient.  8.  Deep vein thrombosis prophylaxis.  Sequential  compression device.  9.  CODE STATUS:  FULL CODE.   Total time of critical care spent on this patient is 65 minutes.    ____________________________ Albertine Patricia, MD dse:ea D: 11/08/2012 03:18:00 ET T: 11/08/2012 06:04:02 ET JOB#: [id number]  cc: Albertine Patricia, MD, <Dictator> Arah Aro Graciela Husbands MD ELECTRONICALLY SIGNED 11/08/2012 8:20

## 2014-09-24 NOTE — Consult Note (Signed)
CC: post polypectomy bleed with NSAID use.  Pt placed on vent overnight per anesthesiology.  Pt extubated this am and tol well.  No bleeding since colonoscopy last night.  Systolics in 24'M.  Pt CXR sugests LLQ infiltrate, could be chronic scaring.  Pt with long hx of smoking quit 5 years ago.  hgb 9.7, plt ct 136.  Will check serial hgb, start on clear liquids.    Electronic Signatures: Manya Silvas (MD)  (Signed on 07-Jun-14 09:54)  Authored  Last Updated: 07-Jun-14 09:54 by Manya Silvas (MD)

## 2014-09-24 NOTE — Consult Note (Signed)
Pt with chest tightness during the nite. I was considering discharge home yesterday (for today) but given her tightness and chronic anemia I recommend cardiology consult and hematology consult for possible iron infusion to help elevate her blood count before discharge.  She gets these already through the cancer clinic.  Please transfer pt to hospitalist service.  I will see her later this afternoon, continue current diet for now.  Electronic Signatures: Manya Silvas (MD)  (Signed on 10-Jun-14 07:41)  Authored  Last Updated: 10-Jun-14 07:41 by Manya Silvas (MD)

## 2014-09-24 NOTE — Discharge Summary (Signed)
PATIENT NAME:  [name], [name] MR#:  [id number] DATE OF BIRTH:  12-25-1945  DATE OF ADMISSION:  11/08/2012 DATE OF DISCHARGE:  11/11/2012  HISTORY AND PHYSICAL:  For a detailed note, please take a look at the history and physical done on admission by Dr. Waldron Labs.    DISCHARGE DIAGNOSES: 1.  Acute gastrointestinal bleed, likely a lower gastrointestinal bleed. Post polypectomy gastrointestinal bleed.  2.  Acute respiratory failure secondary to chronic obstructive pulmonary disease.  3.  Hypertension.  4.  Acute blood loss anemia.  5.  Depression/anxiety.  6.  Diabetes.  7.  Hypothyroidism.   DIET: The patient is being discharged on a low-sodium, low-fat, American Diabetic Association diet. The patient is to be maintained on a full liquid diet with some soft foods like mashed potatoes and scrambled eggs.   ACTIVITY: As tolerated.   FOLLOWUP: With Dr. Vira Agar in the next 1 to 2 weeks.   DISCHARGE MEDICATIONS: Fluoxetine 40 mg b.i.d., trazodone 200 mg at bedtime, Synthroid 150 mcg daily, Humalog t.i.d. with meals on a sliding scale basis, calcium with vitamin D 1 tab daily, lansoprazole 30 mg daily, sublingual nitroglycerin as needed, NovoLog 75/25, 112 units in the morning and 100 units in the evening, tramadol 50 mg q. 6 hours as needed for pain.   CONSULTANTS DURING HOSPITAL COURSE: Dr. Vira Agar from gastroenterology.   PERTINENT STUDIES DONE DURING THE HOSPITAL COURSE: A colonoscopy done on 11/08/2012 showing a single solitary ulcer in the descending colon which was injected and clipped, diverticulosis in the sigmoid colon which was injected, scar in the sigmoid colon.  HOSPITAL COURSE: This is a 69 year old female with medical problems as mentioned above, presented to the hospital due to acute lower GI bleed.   1.  Acute lower gastrointestinal bleed: This was likely the cause of the patient's hematochezia and brisk bleeding. The patient had a recent colonoscopy with a polypectomy, and this  was a post polypectomy GI bleed. The patient had repeat colonoscopy urgently after admission. She had a site which was injected and also clipped which was the post polypectomy site. Post clipping and injection, the patient has had no further evidence of bleeding. Her hemoglobin has remained stable. She has not required any blood transfusions. She does have a history of chronic anemia and does follow up with Dr. Ma Hillock. She will continue to do so, as she requires intermittent IV iron infusions. Her diet was slowly advanced from a clear liquid eventually to a full liquid diet which is what she is currently being discharged on. She can also tolerate some.  soft foods like scrambled eggs and mashed potatoes and have close followup with GI as an outpatient. She has been strongly advised to stay away from aspirin and NSAIDs for now.  2.  Acute blood loss anemia: This was secondary to GI bleed. This was a post polypectomy GI bleed, as mentioned.  The patient underwent urgent colonoscopy after admission which showed the site post polypectomy which was injected and clipped, and she has had no further evidence of bleeding, and her hemoglobin has remained stable. For now, she is to avoid aspirin, NSAIDs and have followup with GI as an outpatient.  3.  Acute respiratory failure:  The patient apparently remained intubated post colonoscopy due to her COPD.  She only was maintained on the ventilator overnight, was quickly extubated, did not have evidence of acute pneumonia or any aspiration pneumonitis. She has been weaned down to room air oxygen, has no further acute respiratory  issues presently.  4.  Episode of chest and epigastric tightness:  This is probably related to anxiety/indigestion. The patient's EKG did not show any evidence of acute ST changes. Her troponins were negative. She is clinically asymptomatic now, This can be further followed as an outpatient.  5.  Diabetes: The patient was maintained on some sliding  scale insulin but we will resume her on NovoLog 75/25 and her sliding-scale with meals.  6.  Hypothyroidism:  The patient was maintained on her Synthroid, and she will resume that upon discharge.   CODE STATUS: The patient is a FULL CODE.         TIME SPENT WITH DISCHARGE: 40 minutes.   ____________________________ Belia Heman. Verdell Carmine, MD vjs:cb D: 11/11/2012 16:16:40 ET T: 11/11/2012 16:32:27 ET JOB#: [id number]  cc: Belia Heman. Verdell Carmine, MD, <Dictator> Manya Silvas, MD Henreitta Leber MD ELECTRONICALLY SIGNED 11/21/2012 20:35

## 2014-09-24 NOTE — Consult Note (Signed)
CC: post polypectomy bleeding.  No bleeding overnight.  Hgb 8.6-9.2 maybe diluted with iv fluid. Can decrease rate and move to floor. Pt tol clear liq diet well. Chest clear, reports soreness but no pain.  Will start full liquid diet tomorrow.  Electronic Signatures: Manya Silvas (MD)  (Signed on 08-Jun-14 10:04)  Authored  Last Updated: 08-Jun-14 10:04 by Manya Silvas (MD)

## 2014-09-24 NOTE — Consult Note (Signed)
CC" post polypectomy bleed.  Pt hgb stable at 8.9, VSS afebrile, chest clear to exam, 92% on room air. No bleeding since Friday nite.  Will start full liquids, discontinue foley, change iv to saline lock, up in chair and ambulate. Home Tuesday or Wed.   Electronic Signatures: Jarold Song (RN) (Signed on 10-Jun-14 02:06)  Valorie Roosevelt (MD) (Signed on 09-Jun-14 07:46)  Authored   Last Updated: 10-Jun-14 02:06 by Jarold Song (RN)

## 2014-09-25 NOTE — Discharge Summary (Signed)
PATIENT NAME:  [name], ISTRE MR#:  [id number] DATE OF BIRTH:  10/07/1945  DATE OF ADMISSION:  10/02/2013  DATE OF DISCHARGE:  10/03/2013  ADMISSION DIAGNOSIS:  Chest pain.  DISCHARGE DIAGNOSES:  1.  Chest pain. 2.  Diabetes.  CONSULTATIONS: None.   IMAGING: Myoview at this time is pending.    LABS: Discharge white blood cells 8.2, hemoglobin 12.6, hematocrit 39, platelets are 169. Sodium 141, potassium 3.9, chloride 107, bicarb 26, BUN 13, creatinine 0.61, glucose is 141, magnesium 2.0. A1c is 6.3. LDL is 135. Cholesterol 211. TSH 2.35. Troponin max 0.08. Troponin at discharge 0.03.   stress test was negative for acute inschemia  HOSPITAL COURSE: This 69 year old female presented with chest pain. For further details, please refer to the H and P.  1.  Chest pain. The patient's troponins were normal, except for one that was 0.08. Her next one after that was 0.03. She underwent a Myoview stress test which was low risk for ischemia. If she has chest pain in the future, she will need a cardiac catheterization. She cannot tolerate a beta blocker and cannot tolerate statin, so these were not started.  2.  Diabetes. The patient will continue her outpatient medications.  3.  Hypothyroidism. Continue Synthroid.   4.  Elevated blood pressure. Currently patient has no history of hypertension. She was started on Imdur for her high blood pressure, and needs close followup.   5.  Depression. The patient will continue outpatient medications.  6.  History of obstructive sleep apnea. The patient apparently does not use a CPAP machine, and should be advised to use a CPAP machine as indicated for OSA.    DISCHARGE MEDICATIONS: 1.  Fluoxetine 40 mg b.i.d.  2.  Tramadol 50 mg q. 6 hours p.r.n.  3.   Levothyroxine 150 mcg daily.  4.  Trazodone 100 mg 2 tablets at bedtime.  5.  Lansoprazole 30 mg b.i.d.  6.  Calcium 2 tablets b.i.d.  7.  Humalog 50 units b.i.d.  8.  Aspirin 325 daily.  9.  Ventolin  HFA 2 puffs q.6 hours p.r.n.  10.  Nitroglycerin sublingual p.r.n. chest pain. 11.  Imdur 60 mg daily.   INSTRUCTIONS: Discharge diet: Low sodium, ADA diet. Discharge activity: As tolerated. Followup: The patient will need to follow up with Dr. Deborra Medina in 1 week. If the stress test is negative, patient may be able to be discharged home.     ____________________________ Bralynn Velador P. Benjie Karvonen, MD spm:mr D: 10/03/2013 11:05:37 ET T: 10/03/2013 21:16:36 ET JOB#: [id number]  cc: Palak Tercero P. Benjie Karvonen, MD, <Dictator> Marciano Sequin. Deborra Medina, MD  Donell Beers Darneshia Demary MD ELECTRONICALLY SIGNED 10/04/2013 12:31

## 2014-09-25 NOTE — H&P (Signed)
PATIENT NAME:  [name], [name] MR#:  [id number] DATE OF BIRTH:  12-Dec-1945  DATE OF ADMISSION:  10/02/2013  PRIMARY CARE PHYSICIAN: Dr. Deborra Medina at [facility].   PRIMARY CARDIOLOGIST: Dr. Rockey Situ.   REFERRING PHYSICIAN: Dr. Kerman Passey   CHIEF COMPLAINT: Chest pain.   HISTORY OF PRESENT ILLNESS: The patient is a pleasant 69 year old female with history of obstructive sleep apnea, not on CPAP, history of COPD, CAD status post stent in 2009 and diabetic hypertensive patient who comes here for chest pain. The patient says her chest pain has been going on off and on, but last a few seconds for multiple months. Her cardiologist is aware.  A couple of days ago, she started to have chest pain, which was different, and it was radiating to the left arm, was across the chest associated with headache and some shortness of breath. She said this lasted about 10 to 15 seconds. Then today as she was sweeping her garage, she started to have chest pain but today it was nonradiating. There is some shortness of breath. This lasted about 30 minutes or so. She called her cardiologist and took a nitroglycerin and came in here. Currently, chest pain is significantly better. EKG is nondiagnostic and she has negative troponin x 1 and hospitalist service was contacted for further evaluation and management.   PAST MEDICAL HISTORY: Obstructive sleep apnea, not on CPAP, CAD status post stent in 2009, hyperlipidemia, brittle diabetes on insulin, depression, anxiety, history of shingles, history of diverticulosis with history of diverticulitis requiring colectomy, hypothyroidism, history of thyroid goiter status post thyroidectomy, history of microcytic anemia which requires iron transfusions q. 3 months with Dr. Ma Hillock.   PAST SURGICAL HISTORY: Colectomy, hysterectomy, cholecystectomy, appendectomy, thyroidectomy, bilateral breast surgery for fibrotic breast disease and some cataract surgery.   ALLERGIES: ATORVASTATIN, BETA BLOCKER,  BUDESONIDE/FORMOTEROL, BUPROPION, CODEINE, DARVOCET AND LATEX, SHELL FISH.   OUTPATIENT MEDICATIONS: She only knows a few of her medications.  Aspirin 81 mg daily, Synthroid 150 mg daily, Humalog mix 75/25 50 mg twice daily, p.r.n. sublingual nitroglycerin. She takes an aspirin suppressor and calcium.   FAMILY HISTORY: Diabetes, heart disease, hypertension and stroke.   SOCIAL HISTORY: Had quit smoking for some time, recently resumed. Smokes less than a pack a day. No alcohol or drug use.   REVIEW OF SYSTEMS:  CONSTITUTIONAL: No fever, fatigue, weakness or weight changes.  EYES: No blurry vision or double vision, had recent cataract removal.  RESPIRATORY: No cough, wheezing. No active shortness of breath or dyspnea on exertion.  CARDIOVASCULAR: Chest pain as above. No swelling in the legs. Occasional orthopnea. No history of CHF.  GASTROINTESTINAL: No nausea, vomiting, abdominal pain, bloody stools or dark stools.  GENITOURINARY: Denies dysuria or hematuria. HEMATOLOGIC AND LYMPHATIC: Has chronic microcytic anemia. No easy bruising or bleeding.  SKIN: No rashes.  MUSCULOSKELETAL: Has chronic arthritis.  NEUROLOGIC: No focal weakness or numbness.  PSYCHIATRIC: Has a history of anxiety and depression.   PHYSICAL EXAMINATION: VITAL SIGNS: Temperature on arrival 98.4, pulse rate 61, respiratory rate 20, blood pressure 189/77, O2 sat 95% on room air.  GENERAL: The patient is an obese female lying in bed, in no obvious distress, actually eating.  HEENT: Normocephalic, atraumatic. Pupils are equal and reactive. Anicteric sclerae. Moist mucous membranes.  NECK: Supple. No thyroid tenderness. No cervical lymphadenopathy.  CARDIOVASCULAR: S1, S2 regular. Positive for murmur in the aortic region .  LUNGS: Clear to auscultation without wheezing, rhonchi or rales.  ABDOMEN: Soft, nontender, nondistended. Positive bowel  sounds in all quadrants.  EXTREMITIES: No pitting edema.  NEUROLOGIC: Cranial  nerves II through XII grossly intact. Strength is 5 out of 5 in all extremities. Sensation is intact to light touch.  SKIN: No obvious rashes or lesions.  LABORATORY DATA:  Glucose 138, potassium is 4.1, BUN 13, creatinine 0.79. Sodium 139. Troponin negative. White count 9.6, hemoglobin 12.2, platelets 167.  X-ray of the chest, one view, enlargement of cardiac silhouette, no acute abnormalities.   EKG: Normal sinus rhythm. Some Q waves in 3, but otherwise no acute changes from previous EKG. No ST changes.   ASSESSMENT AND PLAN: We have a pleasant 69 year old with a history of coronary artery disease, multiple risk factors including hypertension, hyperlipidemia, history of stent in the past who comes in four some atypical, as well as some more recently typical chest pains better with nitroglycerin. She has negative troponin and no acute EKG changes. We will admit the patient to the hospital for observation and rule out acute coronary syndrome, bicyclic cardiac markers and order an echocardiogram and if she is ruled out, a stress test will be ordered for tomorrow. We will follow with her clinically, check a lipid profile, TSH, as well as a hemoglobin A1c. I would continue aspirin; however, she is ALLERGIC TO STATIN AND BETA BLOCKER per chart, and she is not on them but she is rate controlled though her heart rate is close to 60. She has significantly improved chest pain also. Start her on some nitroglycerin patch, but she is ALLERGIC TO MORPHINE.  In regards to her diabetes, I would just cut back on the insulin a little. She might be going for a stress test tomorrow morning and I do not want her to be hypoglycemic. Would start her on a diabetic diet. Tried to call her pharmacy to obtain a complete list but at this point she does not know all of her medications, but she knows most.  Would continue proton pump inhibitor, start her on sliding-scale insulin and start her on heparin for deep vein thrombosis  prophylaxis.  In regards to her tobacco abuse, she was counseled for more than 3 minutes but she does not want a patch.  TOTAL TIME SPENT:  About 40 minutes.  The patient is FULL CODE.    ____________________________ Vivien Presto, MD sa:ce D: 10/02/2013 17:41:24 ET T: 10/02/2013 18:02:15 ET JOB#: [id number]  cc: Vivien Presto, MD, <Dictator> Marciano Sequin. Deborra Medina, MD Minna Merritts, MD Karel Jarvis [facility] MD ELECTRONICALLY SIGNED 10/27/2013 15:55

## 2014-09-26 NOTE — Discharge Summary (Signed)
PATIENT NAME:  [name], [name] MR#:  [id number] DATE OF BIRTH:  [birth year]  DATE OF ADMISSION:  05/24/2011 DATE OF DISCHARGE:  05/28/2011  ADMITTING DIAGNOSIS: Shortness of breath, cough.   DISCHARGE DIAGNOSES:  1. Acute respiratory failure due to acute bronchitis as well as possible chronic obstructive pulmonary disease with exacerbation, now symptoms improved.  2. Obstructive sleep apnea, not compliant, unable to tolerate CPAP.  3. Microcytic anemia with a hemoglobin of 8.5 on presentation. Transfused 1 unit of packed RBCs. Guaiac stools negative. Most recent hemoglobin 11.2.  4. Brittle diabetes. The patient was on Lantus twice a day and was switched over to at bedtime Lantus with short-acting insulin premeal, which seems to have helped with her blood sugars.  5. History of hyperlipidemia, intolerant to cholesterol-lowering medications.  6. Hypothyroidism with TSH elevated at 14. The patient's levothyroxine is increased 150 mcg.  7. Coronary artery disease status post stent placement in June 2009.  8. Former tobacco use.  9. History of shingles.  10. History of diverticulosis with history of diverticulitis requiring colectomy.  11. History of previous thyroid goiter status post thyroidectomy in August 2011.  12. Status post colectomy.  13. Status post hysterectomy.  14. Status post cholecystectomy.  15. Status post appendectomy.  16. Status post thyroidectomy.  17. Status post bilateral breast surgery for fibrocystic breast disease.   PERTINENT LABS/STUDIES: Admitting CPK 260 and CK-MB 2.2. Troponin was less than 0.02. BNP 210. Glucose 301, BUN 7, creatinine 0.79, sodium 135, potassium 3.7, chloride 101, CO2 23, calcium 7.6, AST 42, total protein 6.2, and albumin 2.7. WBC 5.7, hemoglobin 8.5, hematocrit 26.5, platelet count 150, and MCV 71.   Chest x-ray: Pulmonary interstitial prominence, may be related to technique. Interstitial edema or atypical infection are in the differential  consideration.  Echocardiogram: Normal ejection fraction. Left atrium was mildly dilated.   EKG: Normal sinus rhythm.   Blood for stool was guaiac-negative. B12 level normal at 646. Hemoglobin A1c was 8.4. TSH was 14.2. Iron level was 20. Ferritin was 29.   CONSULTANTS: None.   HOSPITAL COURSE: The patient is a 69 year old white female with history of coronary artery disease, hypothyroidism, obstructive sleep apnea, possible chronic obstructive pulmonary disease, brittle diabetes, depression and anxiety who presented with shortness of breath, cough, and headache. The patient reports that she has intermittently chronic shortness of breath. Due to her symptoms, she was seen in the ED and we were asked to admit the patient for possible chronic obstructive pulmonary disease exacerbation. Her chest x-ray showed possible bilateral infiltrates/congestive heart failure or these findings were possibly attributed to technique. The patient was also noticed to be wheezing on presentation so she was thought to have possible chronic obstructive pulmonary disease exacerbation. The patient was placed on antibiotics for possible bronchitis/atypical pneumonia. She was kept on nebulizers. She was given some IV Lasix. With these treatments, she started responding well. Her breathing has improved. The patient also was noted to be anemic on presentation with low MCV. Due to her history of coronary artery disease, she was transfused with 1 unit of packed RBCs. She is doing much better and is improved. The patient also has brittle diabetes and she was on twice a day Lantus. She was switched once daily Lantus and started on premeal insulin, which she has responded well to and her sugars are currently stable. She will need outpatient close monitoring of her sugars.   DISCHARGE MEDICATIONS:  1. Fluoxetine 40 mg 1 tab p.o. twice a day.  2. Trazodone 100 mg three tabs at bedtime as needed. 3. Prevacid 30 mg daily.   4. Nitroglycerin 0.4 mg sublingual p.r.n. chest pain. 5. Ultram 50 mg one to two tablets three times daily as needed.  6. Calcium carbonate 1 tab p.o. twice a day. 7. Lantus 82 units at bedtime.  8. Humalog 10 units before meals. 9. Levothyroxine 150 mcg p.o. daily.  10. Levaquin 500 mg p.o. daily x4 days. 11. Prednisone 30 mg p.o. daily x2 days, 10 mg daily x2 days, and then stop. 12. Albuterol/Atrovent nebulizers every 6 hours p.r.n.  13. Combivent metered-dose inhaler 2 to 4 puffs every 6 hours p.r.n. shortness of breath.   TREATMENTS: None.  HOME OXYGEN: None.  DIET: Low sodium, ADA diet.   DISCHARGE FOLLOWUP: Followup with Dr. Deborra Medina at [facility] in 1 to 2 weeks.   TIME SPENT: 35 minutes. ____________________________ Lafonda Mosses Posey Pronto, MD shp:slb D: 05/28/2011 14:32:29 ET T: 05/30/2011 11:26:50 ET JOB#: [id number]  cc: Amaziah Ghosh H. Posey Pronto, MD, <Dictator> Marciano Sequin. Deborra Medina, MD Alric Seton MD ELECTRONICALLY SIGNED 06/09/2011 15:06

## 2014-09-28 ENCOUNTER — Other Ambulatory Visit: Payer: Self-pay | Admitting: *Deleted

## 2014-09-28 MED ORDER — TRAMADOL HCL 50 MG PO TABS: 50.0000 mg | ORAL_TABLET | Freq: Four times a day (QID) | ORAL | Status: DC | PRN

## 2014-09-28 NOTE — Telephone Encounter (Signed)
PA form received, completed and faxed back to insurance for review and coverage

## 2014-09-28 NOTE — Telephone Encounter (Signed)
Fax received indicating form received and under review and is currently in progress. Sent to be scanned in pt chart

## 2014-09-28 NOTE — Telephone Encounter (Signed)
Last f/u appt 08/2014-CPE 

## 2014-09-28 NOTE — Telephone Encounter (Signed)
Yes I am aware.  She has a very long list of drug intolerances (28) and therefore we are limited to what we can try.

## 2014-09-28 NOTE — Telephone Encounter (Signed)
Spoke to [facility] and advised per Dr Deborra Medina. He states that the medication is approved and a fax confirmation is to follow

## 2014-09-28 NOTE — Telephone Encounter (Signed)
Bill from [facility] called to notify MD promethazine is a high risk medication for this patient.  He needs to verify that she will be followed closely for adverse reactions.  Please call back at [phone].  Prior Josem Kaufmann is pending this call.

## 2014-09-29 NOTE — Telephone Encounter (Signed)
Rx called in to requested pharmacy 

## 2014-10-14 ENCOUNTER — Other Ambulatory Visit: Payer: Self-pay

## 2014-10-14 NOTE — Telephone Encounter (Signed)
Mr [name] left v/m requesting refill on tramadol. Spoke with Velna Hatchet at [address]. Pt received # 50 on 09/29/14. Annual exam on 08/25/14.Please advise.

## 2014-10-20 ENCOUNTER — Other Ambulatory Visit: Payer: Self-pay | Admitting: Internal Medicine

## 2014-10-20 DIAGNOSIS — R112 Nausea with vomiting, unspecified: Secondary | ICD-10-CM | POA: Diagnosis not present

## 2014-10-20 DIAGNOSIS — E119 Type 2 diabetes mellitus without complications: Secondary | ICD-10-CM | POA: Diagnosis not present

## 2014-10-20 DIAGNOSIS — E039 Hypothyroidism, unspecified: Secondary | ICD-10-CM | POA: Diagnosis not present

## 2014-11-02 ENCOUNTER — Other Ambulatory Visit: Payer: Self-pay

## 2014-11-02 MED ORDER — TRAMADOL HCL 50 MG PO TABS
50.0000 mg | ORAL_TABLET | Freq: Four times a day (QID) | ORAL | Status: DC | PRN
Start: 2014-11-02 — End: 2015-01-07

## 2014-11-02 NOTE — Telephone Encounter (Signed)
Pt left v/m requesting refill tramadol to [facility] [address].Marland Kitchen 08/25/14 had annual exam and last refilled 09/29/14 # 50. Pt request refill done today due to arthritic pain.

## 2014-11-02 NOTE — Telephone Encounter (Signed)
Rx called in to requested pharmacy 

## 2014-11-03 ENCOUNTER — Ambulatory Visit
Admission: RE | Admit: 2014-11-03 | Discharge: 2014-11-03 | Disposition: A | Discharge status: Home / Self Care | Payer: Medicare Other | Source: Ambulatory Visit | Attending: Internal Medicine | Admitting: Internal Medicine

## 2014-11-03 DIAGNOSIS — Z9089 Acquired absence of other organs: Secondary | ICD-10-CM | POA: Diagnosis not present

## 2014-11-03 DIAGNOSIS — R112 Nausea with vomiting, unspecified: Secondary | ICD-10-CM | POA: Insufficient documentation

## 2014-11-03 DIAGNOSIS — Z9049 Acquired absence of other specified parts of digestive tract: Secondary | ICD-10-CM | POA: Diagnosis not present

## 2014-11-03 DIAGNOSIS — K5900 Constipation, unspecified: Secondary | ICD-10-CM | POA: Diagnosis not present

## 2014-11-03 MED ORDER — TECHNETIUM TC 99M SULFUR COLLOID
2.0000 | Freq: Once | INTRAVENOUS | Status: AC | PRN
  Administered 2014-11-03: 2.03 via ORAL

## 2014-11-04 ENCOUNTER — Ambulatory Visit: Payer: Medicare Other

## 2014-11-09 ENCOUNTER — Other Ambulatory Visit: Payer: Self-pay

## 2014-11-09 DIAGNOSIS — D509 Iron deficiency anemia, unspecified: Secondary | ICD-10-CM

## 2014-11-10 ENCOUNTER — Inpatient Hospital Stay (HOSPITAL_BASED_OUTPATIENT_CLINIC_OR_DEPARTMENT_OTHER): Payer: Medicare Other | Admitting: Internal Medicine

## 2014-11-10 ENCOUNTER — Inpatient Hospital Stay: Payer: Medicare Other

## 2014-11-10 ENCOUNTER — Inpatient Hospital Stay: Payer: Medicare Other | Attending: Internal Medicine

## 2014-11-10 VITALS — BP 136/74 | HR 67 | Temp 98.3°F | Resp 18 | Ht 67.0 in | Wt 193.3 lb

## 2014-11-10 DIAGNOSIS — D509 Iron deficiency anemia, unspecified: Secondary | ICD-10-CM

## 2014-11-10 DIAGNOSIS — R05 Cough: Secondary | ICD-10-CM | POA: Diagnosis not present

## 2014-11-10 DIAGNOSIS — Z952 Presence of prosthetic heart valve: Secondary | ICD-10-CM

## 2014-11-10 DIAGNOSIS — E079 Disorder of thyroid, unspecified: Secondary | ICD-10-CM | POA: Diagnosis not present

## 2014-11-10 DIAGNOSIS — F329 Major depressive disorder, single episode, unspecified: Secondary | ICD-10-CM | POA: Insufficient documentation

## 2014-11-10 DIAGNOSIS — R5383 Other fatigue: Secondary | ICD-10-CM | POA: Insufficient documentation

## 2014-11-10 DIAGNOSIS — R0609 Other forms of dyspnea: Secondary | ICD-10-CM | POA: Insufficient documentation

## 2014-11-10 DIAGNOSIS — F419 Anxiety disorder, unspecified: Secondary | ICD-10-CM | POA: Insufficient documentation

## 2014-11-10 DIAGNOSIS — J45909 Unspecified asthma, uncomplicated: Secondary | ICD-10-CM | POA: Diagnosis not present

## 2014-11-10 DIAGNOSIS — E780 Pure hypercholesterolemia: Secondary | ICD-10-CM

## 2014-11-10 DIAGNOSIS — Z803 Family history of malignant neoplasm of breast: Secondary | ICD-10-CM

## 2014-11-10 DIAGNOSIS — E119 Type 2 diabetes mellitus without complications: Secondary | ICD-10-CM

## 2014-11-10 DIAGNOSIS — K219 Gastro-esophageal reflux disease without esophagitis: Secondary | ICD-10-CM | POA: Insufficient documentation

## 2014-11-10 DIAGNOSIS — J449 Chronic obstructive pulmonary disease, unspecified: Secondary | ICD-10-CM | POA: Insufficient documentation

## 2014-11-10 DIAGNOSIS — I2510 Atherosclerotic heart disease of native coronary artery without angina pectoris: Secondary | ICD-10-CM

## 2014-11-10 DIAGNOSIS — F17210 Nicotine dependence, cigarettes, uncomplicated: Secondary | ICD-10-CM | POA: Insufficient documentation

## 2014-11-10 DIAGNOSIS — G4733 Obstructive sleep apnea (adult) (pediatric): Secondary | ICD-10-CM

## 2014-11-10 LAB — CBC WITH DIFFERENTIAL/PLATELET
BASOS ABS: 0.1 10*3/uL (ref 0–0.1)
Basophils Relative: 1 %
Eosinophils Absolute: 0.2 10*3/uL (ref 0–0.7)
Eosinophils Relative: 2 %
HCT: 37.5 % (ref 35.0–47.0)
HEMOGLOBIN: 12.2 g/dL (ref 12.0–16.0)
Lymphocytes Relative: 24 %
Lymphs Abs: 2.1 10*3/uL (ref 1.0–3.6)
MCH: 27.6 pg (ref 26.0–34.0)
MCHC: 32.4 g/dL (ref 32.0–36.0)
MCV: 85.2 fL (ref 80.0–100.0)
MONO ABS: 0.7 10*3/uL (ref 0.2–0.9)
MONOS PCT: 8 %
Neutro Abs: 5.7 10*3/uL (ref 1.4–6.5)
Neutrophils Relative %: 65 %
PLATELETS: 172 10*3/uL (ref 150–440)
RBC: 4.4 MIL/uL (ref 3.80–5.20)
RDW: 14.7 % — ABNORMAL HIGH (ref 11.5–14.5)
WBC: 8.7 10*3/uL (ref 3.6–11.0)

## 2014-11-10 LAB — IRON AND TIBC
IRON: 31 ug/dL (ref 28–170)
Saturation Ratios: 11 % (ref 10.4–31.8)
TIBC: 273 ug/dL (ref 250–450)
UIBC: 242 ug/dL

## 2014-11-10 LAB — FERRITIN: FERRITIN: 308 ng/mL — AB (ref 11–307)

## 2014-11-10 NOTE — Progress Notes (Signed)
Patient states that she has been feeling very fatigued, especially over the past couple of weeks. She also states that she has spells of dizziness when she first stands up. She denies any SOB.

## 2014-11-17 ENCOUNTER — Ambulatory Visit (INDEPENDENT_AMBULATORY_CARE_PROVIDER_SITE_OTHER): Payer: Medicare Other | Admitting: Family Medicine

## 2014-11-17 ENCOUNTER — Encounter: Payer: Self-pay | Admitting: Family Medicine

## 2014-11-17 VITALS — BP 110/60 | HR 74 | Temp 97.6°F | Ht 67.0 in | Wt 193.8 lb

## 2014-11-17 DIAGNOSIS — M7501 Adhesive capsulitis of right shoulder: Secondary | ICD-10-CM

## 2014-11-17 DIAGNOSIS — M6530 Trigger finger, unspecified finger: Secondary | ICD-10-CM

## 2014-11-17 DIAGNOSIS — I2510 Atherosclerotic heart disease of native coronary artery without angina pectoris: Secondary | ICD-10-CM | POA: Diagnosis not present

## 2014-11-17 DIAGNOSIS — M25511 Pain in right shoulder: Secondary | ICD-10-CM

## 2014-11-17 DIAGNOSIS — M75101 Unspecified rotator cuff tear or rupture of right shoulder, not specified as traumatic: Secondary | ICD-10-CM | POA: Diagnosis not present

## 2014-11-17 DIAGNOSIS — M7541 Impingement syndrome of right shoulder: Secondary | ICD-10-CM

## 2014-11-17 MED ORDER — METHYLPREDNISOLONE ACETATE 40 MG/ML IJ SUSP
20.0000 mg | Freq: Once | INTRAMUSCULAR | Status: AC
  Administered 2014-11-17: 20 mg via INTRA_ARTICULAR

## 2014-11-17 NOTE — Progress Notes (Signed)
Dr. Frederico Hamman T. Markeya Mincy, MD, [facility] [place], [address] Phone: [phone] Fax: [phone]  11/17/2014  Patient: [name], MRN: [id number], DOB: 06/29/1945, 69 y.o.  Primary Physician:  Arnette Norris, MD  Chief Complaint: Follow-up  Subjective:   [name] is a 69 y.o. very pleasant female patient who presents with the following:  Initial history is detailed below.  The patient is here in follow-up for primarily right greater than left shoulder pain, and initially she was having some impingement on the right side, and she was also having a.c. Joint arthropathy.  I did do a subacromial injection as well as a a.c. Joint injection which only provided modest temporary relief.  She has continued to have some pain, and she has been unable to do any formal physical therapy secondary to finances.  I'm unclear exactly how compliant she has been with doing her home program.  Her symptoms have transitioned over the last 2 months, and she and her husband tell me that she is been guarding her right shoulder quite a bit.  At this point now she has a dull ache on the lateral aspect of her shoulder and in a T-shirt distribution.  She has had loss of motion in all directions, and worse in the internal range of motion.  She is very frustrated.  R 4th trigger finger trigger inj. She is also having some painful triggering on her fourth digit.  This is on the right.  Subac / AC problems ongoing.   Now, R frozen shoulder.     09/15/2014 Last OV with Owens Loffler, MD  F/u R > L shoulder.  Injection only lasted 3 days.  While last office visit, I did both an before meals joint injection and a subacromial injection. The patient had some great relief of symptoms, but unfortunately that lasted for only 3 days. She continues to have some pain. I recommended physical therapy on her last office visit and some home therapy, which he never did  any of this.  She still has pretty significant pain complaints. Open-heart surgery noted recently.  08/04/2014 Last OV with Owens Loffler, MD  R > L shoulder pain.  R is the worst.    patient is very well-known, she has multiple musculoskeletal complaints including right greater than left shoulder pain. She points at the [facility] joint for the point of maximal tenderness.  She is having pain in abduction as well as internal rotation. She also has some pain at night in a T-shirt distribution.  Also the patient has some neck pain relatively chronically.  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    She had a complex open-heart surgery in May 2015, and she has had some difficulty since then. At that time she had 2 bypasses as well as valve replacement.  Past Medical History, Surgical History, Social History, Family History, Problem List, Medications, and Allergies have been reviewed and updated if relevant.  Patient Active Problem List   Diagnosis Date Noted  . Medicare annual wellness visit, subsequent 08/25/2014  . Hypoglycemia 08/25/2014  . Nausea without vomiting 08/25/2014  . Hypercholesterolemia   . OSA (obstructive sleep apnea)   . Diabetes mellitus   . H/O aortic valve replacement   . Chest pain 01/19/2014  . Aortic valve disorder 11/11/2013  . S/P AVR (aortic valve replacement) 10/12/2013  . Coronary Artery Disease s/p CABG 10/2013 10/10/2013  . Other  malaise and fatigue 09/11/2013  . Alopecia 03/06/2013  . Insomnia 02/06/2013  . GERD (gastroesophageal reflux disease) 01/19/2013  . Dysphagia, unspecified(787.20) 01/19/2013  . Dyspnea 11/17/2012  . Weakness 11/17/2012  . Abnormal MRI of head 09/05/2012  . Depression with anxiety 03/21/2012  . Extrinsic asthma 11/12/2011  . Anemia 06/07/2011  . PULMONARY NODULE, SOLITARY 01/25/2010  . Hypothyroidism 08/10/2009  . MICROSCOPIC HEMATURIA 05/07/2009  . GOITER 03/02/2009  . Hypertension 01/06/2009  . MEMORY  LOSS 06/23/2008  . OBSTRUCTIVE SLEEP APNEA- C pap intol 12/25/2007  . Diabetes 09/09/2006  . ALLERGIC RHINITIS 09/09/2006  . GERD 09/09/2006  . DIVERTICULOSIS, COLON 09/09/2006    Past Medical History  Diagnosis Date  . Allergic rhinitis   . Diverticulitis of colon   . GERD (gastroesophageal reflux disease)   . Chronic depression   . Hypercholesterolemia     intolerance of statins and niaspan  . OSA (obstructive sleep apnea)     mild, intolerant of cpap  . ACE-inhibitor cough   . Cataract   . Chronic headache   . Asthma, acute   . Hearing loss   . History of blood transfusion 2013  . Coronary artery disease     a. s/p CABG x2 (LIMA-LAD and SVG-RCA)  b. NST neg for ischemia 07/24/14  . Diabetes mellitus     type 2  . Anxiety   . PONV (postoperative nausea and vomiting)   . Thyroid disease   . Arthritis   . Anemia     iron deficiency anemia  . H/O aortic valve replacement     Past Surgical History  Procedure Laterality Date  . Cholecystectomy  2001  . Colectomy      lap sigmoid  . Tonsillectomy    . Bartholin gland cyst excision    . Tubal ligation    . Bladder suspension    . Breast cyst excision  1988    bilateral nonmalignant tumors, x3  . Vaginal delivery      3  . Thyroidectomy    . Abdominal hysterectomy w/ partial vaginactomy    . Appendectomy  1964  . Colonoscopy  2014    polyps found, 2 clamped off.  . Cardiac catheterization    . Coronary angioplasty  10/29/2007    Prox RCA & Mid Cx.  . Coronary artery bypass graft N/A 10/12/2013    Procedure: CORONARY ARTERY BYPASS GRAFT TIMES TWO;  Surgeon: [name], MD;  Location: [facility];  Service: Open Heart Surgery;  Laterality: N/A;  . Aortic valve replacement N/A 10/12/2013    Procedure: AORTIC VALVE REPLACEMENT (AVR);  Surgeon: [name], MD;  Location: [facility];  Service: Open Heart Surgery;  Laterality: N/A;  . Cataract extraction w/ intraocular lens  implant, bilateral    . Abdominal hysterectomy      . Sternal wires removal N/A 04/13/2014    Procedure: STERNAL WIRES REMOVAL;  Surgeon: [name], MD;  Location: [facility] OR;  Service: Thoracic;  Laterality: N/A;  . Left heart catheterization with coronary angiogram N/A 10/09/2013    Procedure: LEFT HEART CATHETERIZATION WITH CORONARY ANGIOGRAM;  Surgeon: [name], MD;  Location: [facility] CATH LAB;  Service: Cardiovascular;  Laterality: N/A;    History   Social History  . Marital Status: Married    Spouse Name: N/A  . Number of Children: 3  . Years of Education: N/A   Occupational History  . Retired     Quarry manager   Social History Main  Topics  . Smoking status: Former Smoker -- 0.50 packs/day for 30 years    Types: Cigarettes    Quit date: 02/03/2008  . Smokeless tobacco: Never Used  . Alcohol Use: No  . Drug Use: No  . Sexual Activity: Not Currently   Other Topics Concern  . Not on file   Social History Narrative   Does not have Living Will   Desires CPR, would not want prolonged life support if futile.    Family History  Problem Relation Age of Onset  . Breast cancer Mother     breast  . Hypertension Father   . Cancer Father     mesothelioma  . Asthma Father   . Stroke Paternal Grandfather   . Heart disease      Allergies  Allergen Reactions  . Amitriptyline Other (See Comments)    Unknown reaction  . Benadryl [Diphenhydramine] Shortness Of Breath  . Demerol [Meperidine] Other (See Comments)    Unknown reaction  . Gabapentin Other (See Comments)    Unknown reaction  . Loratadine Other (See Comments)    Unknown reaction  . Meperidine Hcl Other (See Comments)    Unknown reaction  . Mirtazapine Other (See Comments)    Unknown reaction  . Olanzapine Other (See Comments)    Unknown reaction   . Voltaren [Diclofenac Sodium] Shortness Of Breath  . Zetia [Ezetimibe] Other (See Comments)    Weakness in legs, shakiness all over  . Ativan [Lorazepam] Other (See Comments)    Causes double vision at highter  than .5 mg dose  . Atorvastatin Other (See Comments)    Muscle aches and weakness  . Budesonide-Formoterol Fumarate Other (See Comments)    Shakiness, tremors  . Bupropion Hcl Other (See Comments)    "cloud over me" depression  . Caffeine Other (See Comments)    jitters  . Codeine Sulfate Other (See Comments)    Makes chest hurt like a heart attack  . Lisinopril Cough  . Metformin Nausea And Vomiting  . Mometasone Furoate Nausea And Vomiting  . Morphine Sulfate Other (See Comments)    Chest pain like a heart attack  . Other Other (See Comments)    Beta Blockers, reaction shortness of breath  . Oxycodone-Acetaminophen Nausea And Vomiting  . Pioglitazone Other (See Comments)    Cannot take because of risk of bladder cancer  . Propoxyphene N-Acetaminophen Nausea And Vomiting  . Rosuvastatin Other (See Comments)    Muscle aches and weakness  . Shellfish Allergy Diarrhea  . Venlafaxine Other (See Comments)    Unknown reaction  . Zolpidem Tartrate Other (See Comments)     Jittery, diarrhea  . Latex Rash    Medication list reviewed and updated in full in [facility].   GEN: No fevers, chills. Nontoxic. Primarily MSK c/o today. MSK: Detailed in the HPI GI: tolerating PO intake without difficulty Neuro: No numbness, parasthesias, or tingling associated. Otherwise the pertinent positives of the ROS are noted above.   Objective:   BP 110/60 mmHg  Pulse 74  Temp(Src) 97.6 F (36.4 C) (Oral)  Ht 5\' 7"  (1.702 m)  Wt 193 lb 12 oz (87.884 kg)  BMI 30.34 kg/m2   GEN: WDWN, NAD, Non-toxic, Alert & Oriented x 3 HEENT: Atraumatic, Normocephalic.  Ears and Nose: No external deformity. EXTR: No clubbing/cyanosis/edema NEURO: Normal gait.  PSYCH: Normally interactive. Conversant. Not depressed or anxious appearing.  Calm demeanor.   Shoulder: R and L Inspection: No muscle wasting or  winging Ecchymosis/edema: neg  AC joint, scapula, clavicle: NT Cervical spine: NT, full  ROM Spurling's: neg ABNORMAL SIDE TESTED: R UNLESS OTHERWISE NOTED, THE CONTRALATERAL SIDE HAS FULL RANGE OF MOTION. Abduction: 5/5, LIMITED TO 135 DEGREES Flexion: 5/5, LIMITED TO 140 DEGNO ROM  IR, lift-off: 5/5. TESTED AT 90 DEGREES OF ABDUCTION, LIMITED TO none DEGREES ER at neutral:  5/5, TESTED AT 90 DEGREES OF ABDUCTION, LIMITED TO 35 DEGREES AC crossover and compression: PAIN Drop Test: neg Empty Can: neg Supraspinatus insertion: NT Bicipital groove: NT ALL OTHER SPECIAL TESTING EQUIVOCAL GIVEN LOSS OF MOTION C5-T1 intact Sensation intact Grip 5/5    On the right hand, the patient has extensive osteoarthritic changes and virtually all joints. There is significant triggering at the fourth digit on the right which is painful.  Radiology:  Assessment and Plan:   Adhesive capsulitis of shoulder, right  Rotator cuff impingement syndrome, right [M75.101] - Plan: methylPREDNISolone acetate (DEPO-MEDROL) injection 20 mg  Right shoulder pain - Plan: methylPREDNISolone acetate (DEPO-MEDROL) injection 20 mg  Trigger finger, acquired   >25 minutes spent in face to face time with patient, >50% spent in counselling or coordination of care: long conversation with this patient and her husband.  Her problem has transitioned.  I think that this is likely secondary adhesive capsulitis due to her not moving her shoulder much from prior rotator cuff tendinopathy, bursitis, and a.c. Arthropathy.  I reviewed the relevant anatomy with him.  Discussed the 3 distinct phases of this problem, and that on average she will have symptoms from somewhere between 12 and 18 months.  Formal physical therapy would likely be helpful, but there are financial limitations in this case.  I reviewed with her [facility] frozen shoulder protocol, and strongly recommended daily compliance.  The patient is frustrated with all of her many different ailments in the last 1-2 years.  She has extensive osteoarthritic  changes throughout most of her body.  She is having a hard time dealing with all this, and she also is had open heart surgery as well as a valve replacement.  She asked me about rheumatology consultation, and I explained the nature of these illnesses in that this is not really rheumatological in origin.  At any point if she would like to have a second opinion and discuss these issues with one of the area orthopedic surgeons I think that that would be reasonable, and I would be happy to do so for her.  Initial charge capture was documented incorrectly by our staff.  Depo-Medrol was used and trigger finger injection alone, and the patient did not have a shoulder injection today.  I attempted to correct this documentation to the best of my ability.  Only 20 mg of Depo-Medrol was used in this case.  Trigger Finger Injection, R Verbal consent was obtained. Risks (including rare risk of infection, potential risk for skin lightening and potential atrophy), benefits and alternatives were discussed. Prepped with Chloraprep and Ethyl Chloride used for anesthesia. Under sterile conditions, patient injected at palmar crease aiming distally with 45 degree angle towards nodule; injected directly into tendon sheath. Medication flowed freely without resistance.  Needle size: 22 gauge 1 1/2 inch Injection: 1/2 cc of Lidocaine 1% and Depo-Medrol 20 mg   Follow-up: 2-3 mo  Signed,  Ngozi Alvidrez T. Leora Platt, MD   Patient's Medications  New Prescriptions   No medications on file  Previous Medications   ALBUTEROL (PROVENTIL HFA;VENTOLIN HFA) 108 (90 BASE) MCG/ACT INHALER    Inhale 2  puffs into the lungs every 6 (six) hours as needed for wheezing or shortness of breath.   CALCIUM-VITAMIN D (CALTRATE 600 PLUS-VIT D PO)    Take 2 tablets by mouth 2 (two) times daily.    FLUOXETINE (PROZAC) 40 MG CAPSULE    Take 1 capsule (40 mg total) by mouth 2 (two) times daily.   FUROSEMIDE (LASIX) 40 MG TABLET       INSULIN LISPRO PROT  & LISPRO (HUMALOG MIX 75/25 KWIKPEN) (75-25) 100 UNIT/ML KWIKPEN    Inject 60 Units into the skin 2 (two) times daily.   LANSOPRAZOLE (PREVACID) 30 MG CAPSULE    Take 1 capsule (30 mg total) by mouth 2 (two) times daily before a meal.   LEVOTHYROXINE (SYNTHROID, LEVOTHROID) 150 MCG TABLET    Take 150 mcg by mouth daily before breakfast.   OMEGA-3 FATTY ACIDS (FISH OIL) 1200 MG CAPS    Take by mouth.   ONETOUCH DELICA LANCETS 03U MISC    USE TO CHECK BLOOD SUGAR THREE TIMES DAILY   POTASSIUM CHLORIDE (K-DUR) 10 MEQ TABLET       PROMETHAZINE (PHENERGAN) 12.5 MG TABLET    TAKE 1 TO 2 TABLETS BY MOUTH  EVERY 6 HOURS AS NEEDED FOR  NASUEA & VOMITING   TRAMADOL (ULTRAM) 50 MG TABLET    Take 1 tablet (50 mg total) by mouth every 6 (six) hours as needed.   TRAZODONE (DESYREL) 100 MG TABLET    TAKE TWO TABLETS BY MOUTH NIGHTLY AT BEDTIME  Modified Medications   No medications on file  Discontinued Medications   No medications on file

## 2014-11-17 NOTE — Progress Notes (Signed)
Pre visit review using our clinic review tool, if applicable. No additional management support is needed unless otherwise documented below in the visit note. 

## 2014-11-28 NOTE — Progress Notes (Signed)
[name]  [phone]) [phone] Fax:(336) [phone]     ID: [name] OB: 12-31-1945  MR#: [id number]  FMB#:[id number]  Patient Care Team: Lucille Passy, MD as PCP - General Elsie Stain, MD as Attending Physician (Pulmonary Disease) [name], MD as Consulting Physician (Cardiology) Judi Cong, MD as Physician Assistant (Internal Medicine) [name], MD as Consulting Physician (Cardiothoracic Surgery)  CHIEF COMPLAINT/DIAGNOSIS:  Iron deficiency Anemia  (lab on 06/07/11 showed serum iron of 23, iron saturation of 7.2%, transferrin 228, hemoglobin 10.1, MCV 73), patient intolerant of oral iron (causes severe constipation)  - got parenteral iron therapy with IV Venofer in Jan 2013, then IV feraheme in June 2014, then on maintenance IV Venofer q 12 weeks.   HISTORY OF PRESENT ILLNESS:  patient returns for continued hematology followup, she was seen in July 2015. She has received IV Feraheme in the past and has been on maintenance IV Venofer. States she continues to have chronic DOE and cough secondary to COPD. States that her fatigue is slowly improving, denies any recurrent imbalance or any falls. Denies any new dyspnea at rest, orthopnea or PND.  Oral intake is good. No recurrent bleeding symptoms.  REVIEW OF SYSTEMS:   ROS As in HPI above. In addition, no new headaches or focal weakness.  No new sore throat or dysphagia. No dizziness or palpitation. No abdominal pain, constipation, diarrhea, dysuria or hematuria. No new skin rash or bleeding symptoms. No new paresthesias in extremities.    PAST MEDICAL HISTORY: Reviewed. Past Medical History  Diagnosis Date  . Allergic rhinitis   . Diverticulitis of colon   . GERD (gastroesophageal reflux disease)   . Chronic depression   . Hypercholesterolemia     intolerance of statins and niaspan  . OSA (obstructive sleep apnea)     mild, intolerant of cpap  . ACE-inhibitor cough   . Cataract   .  Chronic headache   . Asthma, acute   . Hearing loss   . History of blood transfusion 2013  . Coronary artery disease     a. s/p CABG x2 (LIMA-LAD and SVG-RCA)  b. NST neg for ischemia 07/24/14  . Diabetes mellitus     type 2  . Anxiety   . PONV (postoperative nausea and vomiting)   . Thyroid disease   . Arthritis   . Anemia     iron deficiency anemia  . H/O aortic valve replacement   Colonoscopy on 11/07/12 and had 3 polyps removed (tubular adenoma x2, hyperplastic polyp x1), then was hospitalized from June 7 to June 10 for lower GI bleed post polypectomy along with acute respiratory failure secondary to COPD.  PAST SURGICAL HISTORY: Reviewed. Past Surgical History  Procedure Laterality Date  . Cholecystectomy  2001  . Colectomy      lap sigmoid  . Tonsillectomy    . Bartholin gland cyst excision    . Tubal ligation    . Bladder suspension    . Breast cyst excision  1988    bilateral nonmalignant tumors, x3  . Vaginal delivery      3  . Thyroidectomy    . Abdominal hysterectomy w/ partial vaginactomy    . Appendectomy  1964  . Colonoscopy  2014    polyps found, 2 clamped off.  . Cardiac catheterization    . Coronary angioplasty  10/29/2007    Prox RCA & Mid Cx.  . Coronary artery bypass graft N/A 10/12/2013  Procedure: CORONARY ARTERY BYPASS GRAFT TIMES TWO;  Surgeon: [name], MD;  Location: [facility];  Service: Open Heart Surgery;  Laterality: N/A;  . Aortic valve replacement N/A 10/12/2013    Procedure: AORTIC VALVE REPLACEMENT (AVR);  Surgeon: [name], MD;  Location: [facility];  Service: Open Heart Surgery;  Laterality: N/A;  . Cataract extraction w/ intraocular lens  implant, bilateral    . Abdominal hysterectomy    . Sternal wires removal N/A 04/13/2014    Procedure: STERNAL WIRES REMOVAL;  Surgeon: [name], MD;  Location: [facility];  Service: Thoracic;  Laterality: N/A;  . Left heart catheterization with coronary angiogram N/A 10/09/2013    Procedure: LEFT  HEART CATHETERIZATION WITH CORONARY ANGIOGRAM;  Surgeon: [name], MD;  Location: [facility] CATH LAB;  Service: Cardiovascular;  Laterality: N/A;    FAMILY HISTORY: Reviewed. Family History  Problem Relation Age of Onset  . Breast cancer Mother     breast  . Hypertension Father   . Cancer Father     mesothelioma  . Asthma Father   . Stroke Paternal Grandfather   . Heart disease      SOCIAL HISTORY: Reviewed. History  Substance Use Topics  . Smoking status: Former Smoker -- 0.50 packs/day for 30 years    Types: Cigarettes    Quit date: 02/03/2008  . Smokeless tobacco: Never Used  . Alcohol Use: No    Allergies  Allergen Reactions  . Amitriptyline Other (See Comments)    Unknown reaction  . Benadryl [Diphenhydramine] Shortness Of Breath  . Demerol [Meperidine] Other (See Comments)    Unknown reaction  . Gabapentin Other (See Comments)    Unknown reaction  . Loratadine Other (See Comments)    Unknown reaction  . Meperidine Hcl Other (See Comments)    Unknown reaction  . Mirtazapine Other (See Comments)    Unknown reaction  . Olanzapine Other (See Comments)    Unknown reaction   . Voltaren [Diclofenac Sodium] Shortness Of Breath  . Zetia [Ezetimibe] Other (See Comments)    Weakness in legs, shakiness all over  . Ativan [Lorazepam] Other (See Comments)    Causes double vision at highter than .5 mg dose  . Atorvastatin Other (See Comments)    Muscle aches and weakness  . Budesonide-Formoterol Fumarate Other (See Comments)    Shakiness, tremors  . Bupropion Hcl Other (See Comments)    "cloud over me" depression  . Caffeine Other (See Comments)    jitters  . Codeine Sulfate Other (See Comments)    Makes chest hurt like a heart attack  . Lisinopril Cough  . Metformin Nausea And Vomiting  . Mometasone Furoate Nausea And Vomiting  . Morphine Sulfate Other (See Comments)    Chest pain like a heart attack  . Other Other (See Comments)    Beta Blockers,  reaction shortness of breath  . Oxycodone-Acetaminophen Nausea And Vomiting  . Pioglitazone Other (See Comments)    Cannot take because of risk of bladder cancer  . Propoxyphene N-Acetaminophen Nausea And Vomiting  . Rosuvastatin Other (See Comments)    Muscle aches and weakness  . Shellfish Allergy Diarrhea  . Venlafaxine Other (See Comments)    Unknown reaction  . Zolpidem Tartrate Other (See Comments)     Jittery, diarrhea  . Latex Rash    Current Outpatient Prescriptions  Medication Sig Dispense Refill  . albuterol (PROVENTIL HFA;VENTOLIN HFA) 108 (90 BASE) MCG/ACT inhaler Inhale 2 puffs into the lungs  every 6 (six) hours as needed for wheezing or shortness of breath.    . Calcium-Vitamin D (CALTRATE 600 PLUS-VIT D PO) Take 2 tablets by mouth 2 (two) times daily.     Marland Kitchen FLUoxetine (PROZAC) 40 MG capsule Take 1 capsule (40 mg total) by mouth 2 (two) times daily. 180 capsule 0  . Insulin Lispro Prot & Lispro (HUMALOG MIX 75/25 KWIKPEN) (75-25) 100 UNIT/ML Kwikpen Inject 60 Units into the skin 2 (two) times daily.    . lansoprazole (PREVACID) 30 MG capsule Take 1 capsule (30 mg total) by mouth 2 (two) times daily before a meal. 180 capsule 1  . levothyroxine (SYNTHROID, LEVOTHROID) 150 MCG tablet Take 150 mcg by mouth daily before breakfast.    . Omega-3 Fatty Acids (FISH OIL) 1200 MG CAPS Take by mouth.    [name] DELICA LANCETS 16X MISC USE TO CHECK BLOOD SUGAR THREE TIMES DAILY    . promethazine (PHENERGAN) 12.5 MG tablet TAKE 1 TO 2 TABLETS BY MOUTH  EVERY 6 HOURS AS NEEDED FOR  NASUEA & VOMITING 28 tablet 5  . traMADol (ULTRAM) 50 MG tablet Take 1 tablet (50 mg total) by mouth every 6 (six) hours as needed. 50 tablet 0  . traZODone (DESYREL) 100 MG tablet TAKE TWO TABLETS BY MOUTH NIGHTLY AT BEDTIME 60 tablet 3  . furosemide (LASIX) 40 MG tablet     . potassium chloride (K-DUR) 10 MEQ tablet      No current facility-administered medications for this visit.    PHYSICAL  EXAM: Filed Vitals:   11/10/14 1453  BP:   Pulse:   Temp:   Resp: 18     Body mass index is 30.27 kg/(m^2).     GENERAL: Patient is alert and oriented and in no acute distress. There is no icterus or pallor. HEENT: EOMs intact. No cervical lymphadenopathy. CVS: S1S2, regular LUNGS: Bilaterally clear to auscultation, no rhonchi. ABDOMEN: Soft, nontender. No hepatosplenomegaly   EXTREMITIES: No pedal edema.   LAB RESULTS:  Hb 12.2, ferritin 308, iron sat 11%, serum Fe 31.     Component Value Date/Time   NA 137 07/24/2014 1159   NA 137 12/22/2013 1544   K 3.9 07/24/2014 1159   K 4.0 12/22/2013 1544   CL 103 07/24/2014 1159   CL 106 12/22/2013 1544   CO2 26 07/24/2014 1159   CO2 24 12/22/2013 1544   GLUCOSE 156* 07/24/2014 1159   GLUCOSE 184* 12/22/2013 1544   BUN 16 07/24/2014 1159   BUN 15 12/22/2013 1544   CREATININE 0.88 07/24/2014 1159   CREATININE 0.90 12/22/2013 1544   CREATININE 0.67 08/30/2011 1120   CALCIUM 8.3* 07/24/2014 1159   CALCIUM 7.9* 12/22/2013 1544   PROT 6.7 07/07/2014 0913   PROT 7.0 11/25/2012 1335   ALBUMIN 3.7 07/07/2014 0913   ALBUMIN 2.6* 11/25/2012 1335   AST 13 07/07/2014 0913   AST 37 11/25/2012 1335   ALT 11 07/07/2014 0913   ALT 30 11/25/2012 1335   ALKPHOS 101 07/07/2014 0913   ALKPHOS 206* 11/25/2012 1335   BILITOT 0.3 07/07/2014 0913   GFRNONAA 66* 07/24/2014 1159   GFRNONAA >60 12/22/2013 1544   GFRAA 76* 07/24/2014 1159   GFRAA >60 12/22/2013 1544   Lab Results  Component Value Date   WBC 8.7 11/10/2014   NEUTROABS 5.7 11/10/2014   HGB 12.2 11/10/2014   HCT 37.5 11/10/2014   MCV 85.2 11/10/2014   PLT 172 11/10/2014     ASSESSMENT / PLAN:  Microcytic anemia secondary to iron deficiency (lab on 06/07/11 showed serum iron of 23, iron saturation of 7.2%, transferrin 228, Hb 10.1, MCV 73), patient intolerant of oral iron (causes severe constipation), has received full dose of Feraheme 1020 mg on 11/18/12, then on  maintenance IV Venofer q 12 weekly - reviewed labs from today and d/w patient. She is clinically doing steady, has fatiguability and chronic DOE which is at baseline. Labs shows Hb normal at 12.2, ferritin doing well at 308, iron sat and serum iron at low normal range. Given her chronic fatigue and DOE, have recommended continue on low-dose maintenance parenteral iron therapy. Patient wants to hold off and continue monitoring and will consider resuming IV iron if she develops recurrent iron def anemia. Will monitor Hb and Fe study q 16 weeks, next MD followup at 48 weeks with CBC, ferritin,iron and TIBC, and make further treatment planning. In between visits, the patient has been advised to call or come to ER in case of progressive anemia symptoms or acute sickness, and will be evaluated sooner. Patient is agreeable to this plan .   Leia Alf, MD   11/28/2014 2:55 PM

## 2014-11-29 ENCOUNTER — Other Ambulatory Visit: Payer: Self-pay

## 2014-12-03 DIAGNOSIS — J301 Allergic rhinitis due to pollen: Secondary | ICD-10-CM | POA: Diagnosis not present

## 2014-12-08 DIAGNOSIS — J301 Allergic rhinitis due to pollen: Secondary | ICD-10-CM | POA: Diagnosis not present

## 2014-12-24 ENCOUNTER — Other Ambulatory Visit: Payer: Self-pay | Admitting: *Deleted

## 2014-12-24 MED ORDER — FLUOXETINE HCL 40 MG PO CAPS: 2.0000 mg | ORAL_CAPSULE | Freq: Two times a day (BID) | ORAL | Status: DC

## 2014-12-29 ENCOUNTER — Encounter: Payer: Self-pay | Admitting: Family Medicine

## 2015-01-07 ENCOUNTER — Telehealth: Payer: Self-pay | Admitting: Family Medicine

## 2015-01-07 MED ORDER — TRAMADOL HCL 50 MG PO TABS: 50.0000 mg | ORAL_TABLET | Freq: Four times a day (QID) | ORAL | Status: DC | PRN

## 2015-01-07 NOTE — Telephone Encounter (Signed)
Pt is having pain with her shoulder. She is taking aleve and doing excersices.  She is also putting icy patches and heating pad on it. She is still in pain.  Pt requests Call back [phone]

## 2015-01-07 NOTE — Telephone Encounter (Signed)
This is not surprising. 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 I am happy to see her back and recheck her shoulder.

## 2015-01-07 NOTE — Telephone Encounter (Signed)
[name] notified as instructed by telephone.  Tramadol refills called into [facility].  She will try the Tramadol first and if that doesn't help with her pain she will call the office back to schedule office visit with Dr. Lorelei Pont to recheck shoulder.

## 2015-01-14 DIAGNOSIS — E119 Type 2 diabetes mellitus without complications: Secondary | ICD-10-CM | POA: Diagnosis not present

## 2015-01-14 DIAGNOSIS — E039 Hypothyroidism, unspecified: Secondary | ICD-10-CM | POA: Diagnosis not present

## 2015-01-17 ENCOUNTER — Other Ambulatory Visit: Payer: Self-pay | Admitting: Family Medicine

## 2015-01-21 DIAGNOSIS — Z794 Long term (current) use of insulin: Secondary | ICD-10-CM | POA: Diagnosis not present

## 2015-01-21 DIAGNOSIS — E119 Type 2 diabetes mellitus without complications: Secondary | ICD-10-CM | POA: Diagnosis not present

## 2015-01-21 DIAGNOSIS — E039 Hypothyroidism, unspecified: Secondary | ICD-10-CM | POA: Diagnosis not present

## 2015-01-31 ENCOUNTER — Ambulatory Visit (INDEPENDENT_AMBULATORY_CARE_PROVIDER_SITE_OTHER): Payer: Medicare Other | Admitting: Family Medicine

## 2015-01-31 ENCOUNTER — Encounter: Payer: Self-pay | Admitting: Family Medicine

## 2015-01-31 VITALS — BP 122/70 | HR 64 | Temp 97.9°F | Wt 194.8 lb

## 2015-01-31 DIAGNOSIS — I2510 Atherosclerotic heart disease of native coronary artery without angina pectoris: Secondary | ICD-10-CM | POA: Diagnosis not present

## 2015-01-31 DIAGNOSIS — F418 Other specified anxiety disorders: Secondary | ICD-10-CM

## 2015-01-31 DIAGNOSIS — Z23 Encounter for immunization: Secondary | ICD-10-CM

## 2015-01-31 MED ORDER — CITALOPRAM HYDROBROMIDE 20 MG PO TABS: 20.0000 mg | ORAL_TABLET | Freq: Every day | ORAL | Status: DC

## 2015-01-31 NOTE — Progress Notes (Signed)
Pre visit review using our clinic review tool, if applicable. No additional management support is needed unless otherwise documented below in the visit note. 

## 2015-01-31 NOTE — Patient Instructions (Addendum)
Let's wean off your prozac: 1 tablet daily for 1 week, then 1 tablet every other day for 1 week and then stop.  Go ahead and star the Celexa 20 mg daily.  Please call me in 3 weeks with an update.  Hang in there.

## 2015-01-31 NOTE — Progress Notes (Signed)
Subjective:   Patient ID: [name], female    DOB: 03-Dec-1945, 69 y.o.   MRN: [id number]  [name] is a pleasant 69 y.o. year old female who presents to clinic today with Follow-up  on 01/31/2015  HPI: Anxiety/depression- long term, chronic issue. Intolerant of multiple antidepressants/anxiolytics-- see allergy list- she is unsure which are true allergies versus intolerances. Most recently we tried to wean her off prozac and start celexa in 2013- she could not tolerate the celexa and started back on prozac.  She thought it caused jitters and a headache.  Lately she has been more "down."  Does not want to get out of bed and do things. Denies SI or HI. Sleeps "too well." Appetite good. Wt Readings from Last 3 Encounters:  01/31/15 194 lb 12 oz (88.338 kg)  11/17/14 193 lb 12 oz (87.884 kg)  11/10/14 193 lb 5.5 oz (87.7 kg)     Repeatedly refuses psychotherapy or referral to a psychiatrist.   Multiple antidepressant intolerances according to pt and in her chart including Wellbutrin, Effexor, Seroquel, Remeron, Zoloft. She states all of these made her feel worse.   She was talking to her hair stylist over the weekend who told her that celexa worked for her and she wants to try celexa again.   Current Outpatient Prescriptions on File Prior to Visit  Medication Sig Dispense Refill  . albuterol (PROVENTIL HFA;VENTOLIN HFA) 108 (90 BASE) MCG/ACT inhaler Inhale 2 puffs into the lungs every 6 (six) hours as needed for wheezing or shortness of breath.    . Calcium-Vitamin D (CALTRATE 600 PLUS-VIT D PO) Take 2 tablets by mouth 2 (two) times daily.     Marland Kitchen FLUoxetine (PROZAC) 40 MG capsule Take 1 capsule (40 mg total) by mouth 2 (two) times daily. 180 capsule 2  . furosemide (LASIX) 40 MG tablet     . Insulin Lispro Prot & Lispro (HUMALOG MIX 75/25 KWIKPEN) (75-25) 100 UNIT/ML Kwikpen Inject 60 Units into the skin 2 (two) times daily.    . lansoprazole (PREVACID) 30 MG capsule Take  1 capsule (30 mg total) by mouth 2 (two) times daily before a meal. 180 capsule 1  . levothyroxine (SYNTHROID, LEVOTHROID) 150 MCG tablet Take 150 mcg by mouth daily before breakfast.    . Omega-3 Fatty Acids (FISH OIL) 1200 MG CAPS Take by mouth.    [name] DELICA LANCETS 80D MISC USE TO CHECK BLOOD SUGAR THREE TIMES DAILY    . potassium chloride (K-DUR) 10 MEQ tablet     . promethazine (PHENERGAN) 12.5 MG tablet TAKE 1 TO 2 TABLETS BY MOUTH  EVERY 6 HOURS AS NEEDED FOR  NASUEA & VOMITING 28 tablet 5  . traMADol (ULTRAM) 50 MG tablet Take 1 tablet (50 mg total) by mouth every 6 (six) hours as needed. 50 tablet 3  . traZODone (DESYREL) 100 MG tablet TAKE TWO TABLETS BY MOUTH IN THE EVENING AT BEDTIME 60 tablet 5   No current facility-administered medications on file prior to visit.    Allergies  Allergen Reactions  . Amitriptyline Other (See Comments)    Unknown reaction  . Benadryl [Diphenhydramine] Shortness Of Breath  . Demerol [Meperidine] Other (See Comments)    Unknown reaction  . Gabapentin Other (See Comments)    Unknown reaction  . Loratadine Other (See Comments)    Unknown reaction  . Meperidine Hcl Other (See Comments)    Unknown reaction  . Mirtazapine Other (See Comments)  Unknown reaction  . Olanzapine Other (See Comments)    Unknown reaction   . Voltaren [Diclofenac Sodium] Shortness Of Breath  . Zetia [Ezetimibe] Other (See Comments)    Weakness in legs, shakiness all over  . Ativan [Lorazepam] Other (See Comments)    Causes double vision at highter than .5 mg dose  . Atorvastatin Other (See Comments)    Muscle aches and weakness  . Budesonide-Formoterol Fumarate Other (See Comments)    Shakiness, tremors  . Bupropion Hcl Other (See Comments)    "cloud over me" depression  . Caffeine Other (See Comments)    jitters  . Codeine Sulfate Other (See Comments)    Makes chest hurt like a heart attack  . Lisinopril Cough  . Metformin Nausea And Vomiting  .  Mometasone Furoate Nausea And Vomiting  . Morphine Sulfate Other (See Comments)    Chest pain like a heart attack  . Other Other (See Comments)    Beta Blockers, reaction shortness of breath  . Oxycodone-Acetaminophen Nausea And Vomiting  . Pioglitazone Other (See Comments)    Cannot take because of risk of bladder cancer  . Propoxyphene N-Acetaminophen Nausea And Vomiting  . Rosuvastatin Other (See Comments)    Muscle aches and weakness  . Shellfish Allergy Diarrhea  . Venlafaxine Other (See Comments)    Unknown reaction  . Zolpidem Tartrate Other (See Comments)     Jittery, diarrhea  . Latex Rash    Past Medical History  Diagnosis Date  . Allergic rhinitis   . Diverticulitis of colon   . GERD (gastroesophageal reflux disease)   . Chronic depression   . Hypercholesterolemia     intolerance of statins and niaspan  . OSA (obstructive sleep apnea)     mild, intolerant of cpap  . ACE-inhibitor cough   . Cataract   . Chronic headache   . Asthma, acute   . Hearing loss   . History of blood transfusion 2013  . Coronary artery disease     a. s/p CABG x2 (LIMA-LAD and SVG-RCA)  b. NST neg for ischemia 07/24/14  . Diabetes mellitus     type 2  . Anxiety   . PONV (postoperative nausea and vomiting)   . Thyroid disease   . Arthritis   . Anemia     iron deficiency anemia  . H/O aortic valve replacement     Past Surgical History  Procedure Laterality Date  . Cholecystectomy  2001  . Colectomy      lap sigmoid  . Tonsillectomy    . Bartholin gland cyst excision    . Tubal ligation    . Bladder suspension    . Breast cyst excision  1988    bilateral nonmalignant tumors, x3  . Vaginal delivery      3  . Thyroidectomy    . Abdominal hysterectomy w/ partial vaginactomy    . Appendectomy  1964  . Colonoscopy  2014    polyps found, 2 clamped off.  . Cardiac catheterization    . Coronary angioplasty  10/29/2007    Prox RCA & Mid Cx.  . Coronary artery bypass graft N/A  10/12/2013    Procedure: CORONARY ARTERY BYPASS GRAFT TIMES TWO;  Surgeon: [name], MD;  Location: [facility];  Service: Open Heart Surgery;  Laterality: N/A;  . Aortic valve replacement N/A 10/12/2013    Procedure: AORTIC VALVE REPLACEMENT (AVR);  Surgeon: [name], MD;  Location: [facility] OR;  Service: Open Heart  Surgery;  Laterality: N/A;  . Cataract extraction w/ intraocular lens  implant, bilateral    . Abdominal hysterectomy    . Sternal wires removal N/A 04/13/2014    Procedure: STERNAL WIRES REMOVAL;  Surgeon: [name], MD;  Location: [facility];  Service: Thoracic;  Laterality: N/A;  . Left heart catheterization with coronary angiogram N/A 10/09/2013    Procedure: LEFT HEART CATHETERIZATION WITH CORONARY ANGIOGRAM;  Surgeon: [name], MD;  Location: [facility] C[facility] LAB;  Service: Cardiovascular;  Laterality: N/A;    Family History  Problem Relation Age of Onset  . Breast cancer Mother     breast  . Hypertension Father   . Cancer Father     mesothelioma  . Asthma Father   . Stroke Paternal Grandfather   . Heart disease      Social History   Social History  . Marital Status: Married    Spouse Name: N/A  . Number of Children: 3  . Years of Education: N/A   Occupational History  . Retired     Quarry manager   Social History Main Topics  . Smoking status: Former Smoker -- 0.50 packs/day for 30 years    Types: Cigarettes    Quit date: 02/03/2008  . Smokeless tobacco: Never Used  . Alcohol Use: No  . Drug Use: No  . Sexual Activity: Not Currently   Other Topics Concern  . Not on file   Social History Narrative   Does not have Living Will   Desires CPR, would not want prolonged life support if futile.   The PMH, PSH, Social History, Family History, Medications, and allergies have been reviewed in [facility], and have been updated if relevant.   Review of Systems  Psychiatric/Behavioral: Positive for dysphoric mood and decreased concentration. Negative for suicidal ideas,  hallucinations, behavioral problems, confusion, sleep disturbance, self-injury and agitation. The patient is nervous/anxious. The patient is not hyperactive.   All other systems reviewed and are negative.      Objective:    BP 122/70 mmHg  Pulse 64  Temp(Src) 97.9 F (36.6 C) (Oral)  Wt 194 lb 12 oz (88.338 kg)  SpO2 97%   Physical Exam  Constitutional: She is oriented to person, place, and time. She appears well-developed and well-nourished. No distress.  HENT:  Head: Normocephalic and atraumatic.  Eyes: Conjunctivae are normal.  Cardiovascular: Normal rate.   Pulmonary/Chest: Effort normal.  Musculoskeletal: Normal range of motion.  Neurological: She is alert and oriented to person, place, and time. No cranial nerve deficit.  Skin: Skin is warm and dry.  Psychiatric: She has a normal mood and affect. Her behavior is normal. Judgment and thought content normal.  Nursing note and vitals reviewed.         Assessment & Plan:   Depression with anxiety No Follow-up on file.

## 2015-01-31 NOTE — Assessment & Plan Note (Signed)
Deteriorated. >15 minutes spent in face to face time with patient, >50% spent in counselling or coordination of care. Since she did not have a true allergic reaction to celexa, more of an intolerance, I agreed to another trial of celexa.  Wean off prozac and start celexa. See AVS for details. She will call me in 3-4 weeks with an update, sooner if she is again intolerant. The patient indicates understanding of these issues and agrees with the plan.

## 2015-02-02 ENCOUNTER — Other Ambulatory Visit: Payer: Self-pay | Admitting: Family Medicine

## 2015-02-09 ENCOUNTER — Other Ambulatory Visit: Payer: Self-pay | Admitting: Family Medicine

## 2015-02-09 NOTE — Telephone Encounter (Signed)
[name] left v/m requesting cb with status of promethazine refill to [facility] [address].Please advise. rx last refilled # 28 x 5 on 08/25/14 and last seen 01/31/15.

## 2015-02-16 ENCOUNTER — Ambulatory Visit: Payer: Medicare Other | Admitting: Family Medicine

## 2015-02-22 ENCOUNTER — Other Ambulatory Visit: Payer: Self-pay | Admitting: *Deleted

## 2015-02-22 MED ORDER — LANSOPRAZOLE 30 MG PO CPDR: 30.0000 mg | DELAYED_RELEASE_CAPSULE | Freq: Two times a day (BID) | ORAL | Status: DC

## 2015-02-22 NOTE — Telephone Encounter (Signed)
Mr Rothlisberger called on status of lansoprazole refill; advised sent electronically and Mr Banbury just received notice from pharmacy refill received.

## 2015-03-02 ENCOUNTER — Inpatient Hospital Stay: Payer: Medicare Other | Attending: Internal Medicine

## 2015-03-02 DIAGNOSIS — D509 Iron deficiency anemia, unspecified: Secondary | ICD-10-CM

## 2015-03-02 LAB — CBC WITH DIFFERENTIAL/PLATELET
BASOS ABS: 0.1 10*3/uL (ref 0–0.1)
BASOS PCT: 1 %
EOS ABS: 0.2 10*3/uL (ref 0–0.7)
EOS PCT: 2 %
HCT: 36.9 % (ref 35.0–47.0)
Hemoglobin: 12.4 g/dL (ref 12.0–16.0)
Lymphocytes Relative: 21 %
Lymphs Abs: 2.1 10*3/uL (ref 1.0–3.6)
MCH: 27.8 pg (ref 26.0–34.0)
MCHC: 33.6 g/dL (ref 32.0–36.0)
MCV: 82.9 fL (ref 80.0–100.0)
MONO ABS: 0.7 10*3/uL (ref 0.2–0.9)
Monocytes Relative: 7 %
Neutro Abs: 6.9 10*3/uL — ABNORMAL HIGH (ref 1.4–6.5)
Neutrophils Relative %: 69 %
PLATELETS: 179 10*3/uL (ref 150–440)
RBC: 4.45 MIL/uL (ref 3.80–5.20)
RDW: 14.7 % — AB (ref 11.5–14.5)
WBC: 9.9 10*3/uL (ref 3.6–11.0)

## 2015-03-02 LAB — IRON AND TIBC
IRON: 40 ug/dL (ref 28–170)
Saturation Ratios: 14 % (ref 10.4–31.8)
TIBC: 278 ug/dL (ref 250–450)
UIBC: 238 ug/dL

## 2015-03-02 LAB — FERRITIN: FERRITIN: 383 ng/mL — AB (ref 11–307)

## 2015-03-10 ENCOUNTER — Other Ambulatory Visit: Payer: Self-pay | Admitting: Family Medicine

## 2015-03-11 ENCOUNTER — Ambulatory Visit (INDEPENDENT_AMBULATORY_CARE_PROVIDER_SITE_OTHER): Payer: Medicare Other | Admitting: Cardiovascular Disease

## 2015-03-11 ENCOUNTER — Encounter: Payer: Self-pay | Admitting: Cardiovascular Disease

## 2015-03-11 VITALS — BP 100/64 | HR 73 | Ht 66.0 in | Wt 192.0 lb

## 2015-03-11 DIAGNOSIS — Z954 Presence of other heart-valve replacement: Secondary | ICD-10-CM | POA: Diagnosis not present

## 2015-03-11 DIAGNOSIS — I359 Nonrheumatic aortic valve disorder, unspecified: Secondary | ICD-10-CM

## 2015-03-11 DIAGNOSIS — I2510 Atherosclerotic heart disease of native coronary artery without angina pectoris: Secondary | ICD-10-CM

## 2015-03-11 DIAGNOSIS — J301 Allergic rhinitis due to pollen: Secondary | ICD-10-CM | POA: Diagnosis not present

## 2015-03-11 DIAGNOSIS — E785 Hyperlipidemia, unspecified: Secondary | ICD-10-CM

## 2015-03-11 DIAGNOSIS — I10 Essential (primary) hypertension: Secondary | ICD-10-CM

## 2015-03-11 DIAGNOSIS — Z952 Presence of prosthetic heart valve: Secondary | ICD-10-CM

## 2015-03-11 NOTE — Progress Notes (Signed)
Chief Complaint  Patient presents with  . Shortness of Breath     History of Present Illness: 69 y.o. female with a hx of anemia, HTN, HLD, DM, COPD, CAD s/p DES in 10/2007 to RCA and mid CFX and CABG May 2015 as well as AVR May 2015 here today for cardiac follow up. She was admitted 10/2013 at [facility] with chest pain. Echo demonstrated normal LVF, mod AI and mild AS. Myoview was negative for ischemia. She followed up with Dr. Ida Rogue in [place] with continued symptoms and was set up for [facility]. This demonstrated 3v CAD with severe prox LAD stenosis and ostial RCA. She was referred for CABG. Intraoperative TEE demonstrated mild AS and mod to severe AI. She underwent CABG with Dr. Cyndia Bent 10/12/13 (L-LAD, S-RCA) + AVR with a bioprosthetic valve (21 mm [employer]). Post op course was fairly uneventful. Echo June 2015 with normal LVEF and normal AVR. She was seen in our office 12/03/13 by Richardson Dopp, PA-C and still having chest pain. Troponin was negative. D-dimer was elevated. Pt was sent to [facility] for CTA chest to exclude PE that evening. NO PE on chest CT but noted to have small left pleural effusion. Admitted to [facility] 07/26/14 with chest pain. Stress myoview with no ischemia. She does not tolerate statins or Zetia.   She returns today for follow up. She denies SOB, PND, edema or syncope. Mid chest wall pain at rest.   Primary Care Physician: Arnette Norris, MD   Past Medical History  Diagnosis Date  . Allergic rhinitis   . Diverticulitis of colon   . GERD (gastroesophageal reflux disease)   . Chronic depression   . Hypercholesterolemia     intolerance of statins and niaspan  . OSA (obstructive sleep apnea)     mild, intolerant of cpap  . ACE-inhibitor cough   . Cataract   . Chronic headache   . Asthma, acute   . Hearing loss   . History of blood transfusion 2013  . Coronary artery disease     a. s/p CABG x2 (LIMA-LAD and SVG-RCA)  b. NST neg for ischemia 07/24/14  . Diabetes  mellitus     type 2  . Anxiety   . PONV (postoperative nausea and vomiting)   . Thyroid disease   . Arthritis   . Anemia     iron deficiency anemia  . H/O aortic valve replacement     Past Surgical History  Procedure Laterality Date  . Cholecystectomy  2001  . Colectomy      lap sigmoid  . Tonsillectomy    . Bartholin gland cyst excision    . Tubal ligation    . Bladder suspension    . Breast cyst excision  1988    bilateral nonmalignant tumors, x3  . Vaginal delivery      3  . Thyroidectomy    . Abdominal hysterectomy w/ partial vaginactomy    . Appendectomy  1964  . Colonoscopy  2014    polyps found, 2 clamped off.  . Cardiac catheterization    . Coronary angioplasty  10/29/2007    Prox RCA & Mid Cx.  . Coronary artery bypass graft N/A 10/12/2013    Procedure: CORONARY ARTERY BYPASS GRAFT TIMES TWO;  Surgeon: [name], MD;  Location: [facility] OR;  Service: Open Heart Surgery;  Laterality: N/A;  . Aortic valve replacement N/A 10/12/2013    Procedure: AORTIC VALVE REPLACEMENT (AVR);  Surgeon: [name],  MD;  Location: [facility] OR;  Service: Open Heart Surgery;  Laterality: N/A;  . Cataract extraction w/ intraocular lens  implant, bilateral    . Abdominal hysterectomy    . Sternal wires removal N/A 04/13/2014    Procedure: STERNAL WIRES REMOVAL;  Surgeon: [name], MD;  Location: [facility] OR;  Service: Thoracic;  Laterality: N/A;  . Left heart catheterization with coronary angiogram N/A 10/09/2013    Procedure: LEFT HEART CATHETERIZATION WITH CORONARY ANGIOGRAM;  Surgeon: [name], MD;  Location: [facility] CATH LAB;  Service: Cardiovascular;  Laterality: N/A;    Current Outpatient Prescriptions  Medication Sig Dispense Refill  . albuterol (PROVENTIL HFA;VENTOLIN HFA) 108 (90 BASE) MCG/ACT inhaler Inhale 2 puffs into the lungs every 6 (six) hours as needed for wheezing or shortness of breath.    Marland Kitchen aspirin EC 81 MG tablet Take 81 mg by mouth daily.    . Calcium-Vitamin D  (CALTRATE 600 PLUS-VIT D PO) Take 2 tablets by mouth 2 (two) times daily.     . citalopram (CELEXA) 20 MG tablet Take 1 tablet (20 mg total) by mouth daily. 30 tablet 3  . Insulin Lispro Prot & Lispro (HUMALOG MIX 75/25 KWIKPEN) (75-25) 100 UNIT/ML Kwikpen Inject 60 Units into the skin 2 (two) times daily.    . lansoprazole (PREVACID) 30 MG capsule Take 1 capsule (30 mg total) by mouth 2 (two) times daily before a meal. 180 capsule 3  . levothyroxine (SYNTHROID, LEVOTHROID) 150 MCG tablet Take 150 mcg by mouth daily before breakfast.    . metFORMIN (GLUCOPHAGE) 500 MG tablet Take 500 mg by mouth 2 (two) times daily with a meal.    . Omega-3 Fatty Acids (FISH OIL) 1200 MG CAPS Take by mouth.    [name] DELICA LANCETS 00Q MISC USE TO CHECK BLOOD SUGAR THREE TIMES DAILY    . promethazine (PHENERGAN) 12.5 MG tablet TAKE ONE TO TWO TABLETS BY MOUTH EVERY 6 HOURS AS NEEDED FOR NAUSEA AND VOMITING 28 tablet 0  . traMADol (ULTRAM) 50 MG tablet Take 1 tablet (50 mg total) by mouth every 6 (six) hours as needed. 50 tablet 3  . traZODone (DESYREL) 100 MG tablet TAKE TWO TABLETS BY MOUTH IN THE EVENING AT BEDTIME 60 tablet 5   No current facility-administered medications for this visit.    Allergies  Allergen Reactions  . Amitriptyline Other (See Comments)    Unknown reaction  . Benadryl [Diphenhydramine] Shortness Of Breath  . Demerol [Meperidine] Other (See Comments)    Unknown reaction  . Gabapentin Other (See Comments)    Unknown reaction  . Loratadine Other (See Comments)    Unknown reaction  . Meperidine Hcl Other (See Comments)    Unknown reaction  . Mirtazapine Other (See Comments)    Unknown reaction  . Olanzapine Other (See Comments)    Unknown reaction   . Voltaren [Diclofenac Sodium] Shortness Of Breath  . Zetia [Ezetimibe] Other (See Comments)    Weakness in legs, shakiness all over  . Ativan [Lorazepam] Other (See Comments)    Causes double vision at highter than .5 mg dose    . Atorvastatin Other (See Comments)    Muscle aches and weakness  . Budesonide-Formoterol Fumarate Other (See Comments)    Shakiness, tremors  . Bupropion Hcl Other (See Comments)    "cloud over me" depression  . Caffeine Other (See Comments)    jitters  . Codeine Sulfate Other (See Comments)    Makes chest hurt like  a heart attack  . Lisinopril Cough  . Metformin Nausea And Vomiting  . Mometasone Furoate Nausea And Vomiting  . Morphine Sulfate Other (See Comments)    Chest pain like a heart attack  . Other Other (See Comments)    Beta Blockers, reaction shortness of breath  . Oxycodone-Acetaminophen Nausea And Vomiting  . Pioglitazone Other (See Comments)    Cannot take because of risk of bladder cancer  . Propoxyphene N-Acetaminophen Nausea And Vomiting  . Rosuvastatin Other (See Comments)    Muscle aches and weakness  . Shellfish Allergy Diarrhea  . Venlafaxine Other (See Comments)    Unknown reaction  . Zolpidem Tartrate Other (See Comments)     Jittery, diarrhea  . Latex Rash    Social History   Social History  . Marital Status: Married    Spouse Name: N/A  . Number of Children: 3  . Years of Education: N/A   Occupational History  . Retired     Quarry manager   Social History Main Topics  . Smoking status: Former Smoker -- 0.50 packs/day for 30 years    Types: Cigarettes    Quit date: 02/03/2008  . Smokeless tobacco: Never Used  . Alcohol Use: No  . Drug Use: No  . Sexual Activity: Not Currently   Other Topics Concern  . Not on file   Social History Narrative   Does not have Living Will   Desires CPR, would not want prolonged life support if futile.    Family History  Problem Relation Age of Onset  . Breast cancer Mother     breast  . Hypertension Father   . Cancer Father     mesothelioma  . Asthma Father   . Stroke Paternal Grandfather   . Heart disease      Review of Systems:  As stated in the HPI and otherwise negative.   BP 100/64 mmHg  Pulse  73  Ht 5\' 6"  (1.676 m)  Wt 192 lb (87.091 kg)  BMI 31.00 kg/m2  SpO2 94%  Physical Examination: General: Well developed, well nourished, NAD HEENT: OP clear, mucus membranes moist SKIN: warm, dry. No rashes. Neuro: No focal deficits Musculoskeletal: Muscle strength 5/5 all ext Psychiatric: Mood and affect normal Neck: No JVD, no carotid bruits, no thyromegaly, no lymphadenopathy. Lungs:Clear bilaterally, no wheezes, rhonci, crackles Cardiovascular: Regular rate and rhythm. Systolic murmurs, gallops or rubs. Abdomen:Soft. Bowel sounds present. Non-tender.  Extremities: No lower extremity edema. Pulses are 2 + in the bilateral DP/PT.  EKG:  EKG is ordered today. The ekg ordered today demonstrates NSR, rate 73 bpm. QTc452msec  Recent Labs: 07/07/2014: ALT 11; TSH 1.62 07/24/2014: BUN 16; Creatinine, Ser 0.88; Potassium 3.9; Sodium 137 03/02/2015: Hemoglobin 12.4; Platelets 179   Lipid Panel    Component Value Date/Time   CHOL 220* 07/07/2014 0820   CHOL 211* 10/03/2013 0515   TRIG 329.0* 07/07/2014 0820   TRIG 272* 10/03/2013 0515   HDL 30.20* 07/07/2014 0820   HDL 22* 10/03/2013 0515   CHOLHDL 7 07/07/2014 0820   VLDL 65.8* 07/07/2014 0820   VLDL 54* 10/03/2013 0515   LDLCALC 135* 10/03/2013 0515   LDLCALC 115* 06/23/2008 0000   LDLDIRECT 127.0 07/07/2014 0820     Wt Readings from Last 3 Encounters:  03/11/15 192 lb (87.091 kg)  01/31/15 194 lb 12 oz (88.338 kg)  11/17/14 193 lb 12 oz (87.884 kg)     Other studies Reviewed: Additional studies/ records that were reviewed today include: .  Review of the above records demonstrates:    Assessment and Plan:   1. CAD: s/p CABG. Chronic chest wall pain. Stable. Continue ASA. She does not tolerate statins or beta blockers.   2. Aortic valve insufficiency: s/p AVR. Stable. Continue SBE prophylaxis.   3. Hypertension: Controlled. No changes.   4. Hyperlipidemia: She is statin intolerant (Crestor, Lipitor, Pravastatin).  She did not tolerate Zetia.  She was seen in lipid clinic 07/13/14 and tried on low dose Pravastatin but did not tolerate due to muscle aches. She does not wish to try the new PCSK9 inhibitors but she would consider it down there road. Will check lipids now and based on results, will consider referral back to lipid clinic.   Current medicines are reviewed at length with the patient today.  The patient does not have concerns regarding medicines.  The following changes have been made:  no change  Labs/ tests ordered today include:   Orders Placed This Encounter  Procedures  . Lipid Profile  . EKG 12-Lead    Disposition:   FU with me in 6 months  Signed, [name], MD 03/11/2015 9:13 AM    [facility] [address], [place], [place]  [address] Phone: [phone]; Fax: [phone]

## 2015-03-11 NOTE — Patient Instructions (Signed)
Medication Instructions:  Your physician recommends that you continue on your current medications as directed. Please refer to the Current Medication list given to you today.   Labwork: Your physician recommends that you return for fasting lab work next week--Lipid profile   Testing/Procedures: None   Follow-Up: Your physician wants you to follow-up in: 6 months.  You will receive a reminder letter in the mail two months in advance. If you don't receive a letter, please call our office to schedule the follow-up appointment.   Any Other Special Instructions Will Be Listed Below (If Applicable).

## 2015-03-16 ENCOUNTER — Other Ambulatory Visit (INDEPENDENT_AMBULATORY_CARE_PROVIDER_SITE_OTHER): Payer: Medicare Other | Admitting: *Deleted

## 2015-03-16 DIAGNOSIS — E785 Hyperlipidemia, unspecified: Secondary | ICD-10-CM

## 2015-03-16 DIAGNOSIS — I10 Essential (primary) hypertension: Secondary | ICD-10-CM

## 2015-03-16 LAB — LIPID PANEL
Cholesterol: 206 mg/dL — ABNORMAL HIGH (ref 125–200)
HDL: 21 mg/dL — AB (ref >=46)
LDL CALC: 111 mg/dL (ref <130)
Total CHOL/HDL Ratio: 9.8 Ratio — ABNORMAL HIGH (ref <=5.0)
Triglycerides: 369 mg/dL — ABNORMAL HIGH (ref <150)
VLDL: 74 mg/dL — ABNORMAL HIGH (ref <30)

## 2015-03-17 DIAGNOSIS — J301 Allergic rhinitis due to pollen: Secondary | ICD-10-CM | POA: Diagnosis not present

## 2015-03-24 ENCOUNTER — Ambulatory Visit (INDEPENDENT_AMBULATORY_CARE_PROVIDER_SITE_OTHER): Payer: Medicare Other | Admitting: Family Medicine

## 2015-03-24 ENCOUNTER — Ambulatory Visit: Payer: Medicare Other | Admitting: Family Medicine

## 2015-03-24 ENCOUNTER — Encounter: Payer: Self-pay | Admitting: Family Medicine

## 2015-03-24 VITALS — BP 123/68 | HR 77 | Temp 98.9°F | Ht 66.0 in | Wt 190.8 lb

## 2015-03-24 DIAGNOSIS — I2510 Atherosclerotic heart disease of native coronary artery without angina pectoris: Secondary | ICD-10-CM | POA: Diagnosis not present

## 2015-03-24 DIAGNOSIS — J069 Acute upper respiratory infection, unspecified: Secondary | ICD-10-CM | POA: Diagnosis not present

## 2015-03-24 MED ORDER — BENZONATATE 200 MG PO CAPS: 200.0000 mg | ORAL_CAPSULE | Freq: Two times a day (BID) | ORAL | Status: DC | PRN

## 2015-03-24 NOTE — Assessment & Plan Note (Signed)
No  Current sign of bacterial infection.Likely virall infection.  Pt at elevated risk  For PNA given co-morbidities. Supportive care, benzonatate for cough, mucolytic and saline irrigation.  Call if not improving as expected on viral timeline. Reviewed in detail with pt.

## 2015-03-24 NOTE — Patient Instructions (Signed)
Start nasal saline spray OTC 2 spray per nostril 2-3 times a day.  Mucinex  Twice daily to loose mucus.  Start benzonatate as needed for cough. Rest, fluids.   Call if not improving as expected and reviewed at office visit.

## 2015-03-24 NOTE — Progress Notes (Signed)
   Subjective:    Patient ID: [name], female    DOB: [birth year], 69 y.o.   MRN: [id number]  Cough This is a new problem. The current episode started in the past 7 days (4 days). The problem has been rapidly worsening. The problem occurs constantly. The cough is productive of sputum. Associated symptoms include a fever, headaches and a sore throat. Pertinent negatives include no ear pain or myalgias. Associated symptoms comments: Chest and upper back pain with coughing, cold sweat, weak  Sinus pressure. The symptoms are aggravated by lying down (sleeps in recliner). Risk factors: past smoker. Treatments tried: mucinex, aleve, sucrets. The treatment provided mild relief. Her past medical history is significant for asthma and pneumonia. There is no history of COPD or emphysema. stham as child  Fever  The current episode started yesterday. The maximum temperature noted was 99 to 99.9 F (98.14F). Associated symptoms include coughing, headaches and a sore throat. Pertinent negatives include no ear pain.     Social History /Family History/Past Medical History reviewed and updated if needed.  CAD as well as above noted pulmonary issues. Review of Systems  Constitutional: Positive for fever.  HENT: Positive for sore throat. Negative for ear pain.   Respiratory: Positive for cough.   Musculoskeletal: Negative for myalgias.  Neurological: Positive for headaches.       Objective:   Physical Exam  Constitutional: Vital signs are normal. She appears well-developed and well-nourished. She is cooperative.  Non-toxic appearance. She does not appear ill. No distress.  HENT:  Head: Normocephalic.  Right Ear: Hearing, tympanic membrane, external ear and ear canal normal. Tympanic membrane is not erythematous, not retracted and not bulging.  Left Ear: Hearing, tympanic membrane, external ear and ear canal normal. Tympanic membrane is not erythematous, not retracted and not bulging.  Nose: Mucosal edema  and rhinorrhea present. Right sinus exhibits no maxillary sinus tenderness and no frontal sinus tenderness. Left sinus exhibits no maxillary sinus tenderness and no frontal sinus tenderness.  Mouth/Throat: Uvula is midline, oropharynx is clear and moist and mucous membranes are normal. No oropharyngeal exudate, posterior oropharyngeal edema or posterior oropharyngeal erythema.  Eyes: Conjunctivae, EOM and lids are normal. Pupils are equal, round, and reactive to light. Lids are everted and swept, no foreign bodies found.  Neck: Trachea normal and normal range of motion. Neck supple. Carotid bruit is not present. No thyroid mass and no thyromegaly present.  Cardiovascular: Normal rate, regular rhythm, S1 normal, S2 normal, intact distal pulses and normal pulses.  Exam reveals no gallop and no friction rub.   Murmur heard. Pulmonary/Chest: Effort normal and breath sounds normal. No tachypnea. No respiratory distress. She has no decreased breath sounds. She has no wheezes. She has no rhonchi. She has no rales.  Neurological: She is alert.  Skin: Skin is warm, dry and intact. No rash noted.  Psychiatric: Her speech is normal and behavior is normal. Judgment normal. Her mood appears not anxious. Cognition and memory are normal. She does not exhibit a depressed mood.          Assessment & Plan:

## 2015-03-24 NOTE — Progress Notes (Signed)
Pre visit review using our clinic review tool, if applicable. No additional management support is needed unless otherwise documented below in the visit note. 

## 2015-04-12 ENCOUNTER — Other Ambulatory Visit: Payer: Self-pay | Admitting: Family Medicine

## 2015-04-12 NOTE — Telephone Encounter (Signed)
Last office visit 03/24/2015 with Dr. Diona Browner.  Last refilled 01/07/215 for #50 with 3 refills.  Ok to refill?

## 2015-04-13 NOTE — Telephone Encounter (Signed)
Tramadol called into [facility] [address] 

## 2015-04-25 ENCOUNTER — Encounter: Payer: Self-pay | Admitting: Family Medicine

## 2015-04-25 ENCOUNTER — Ambulatory Visit (INDEPENDENT_AMBULATORY_CARE_PROVIDER_SITE_OTHER): Payer: Medicare Other | Admitting: Family Medicine

## 2015-04-25 VITALS — BP 124/74 | HR 62 | Temp 97.9°F | Wt 193.2 lb

## 2015-04-25 DIAGNOSIS — I2510 Atherosclerotic heart disease of native coronary artery without angina pectoris: Secondary | ICD-10-CM

## 2015-04-25 DIAGNOSIS — K1120 Sialoadenitis, unspecified: Secondary | ICD-10-CM | POA: Diagnosis not present

## 2015-04-25 MED ORDER — CEPHALEXIN 500 MG PO CAPS: 500.0000 mg | ORAL_CAPSULE | Freq: Four times a day (QID) | ORAL | Status: DC

## 2015-04-25 NOTE — Progress Notes (Signed)
Pre visit review using our clinic review tool, if applicable. No additional management support is needed unless otherwise documented below in the visit note. 

## 2015-04-25 NOTE — Patient Instructions (Signed)
Parotitis Parotitis is soreness and inflammation of one or both parotid glands. The parotid glands produce saliva. They are located on each side of the face, below and in front of the earlobes. The saliva produced comes out of tiny openings (ducts) inside the cheeks. In most cases, parotitis goes away over time or with treatment. If your parotitis is caused by certain long-term (chronic) diseases, it may come back again.  CAUSES  Parotitis can be caused by:  Viral infections. Mumps is one viral infection that can cause parotitis.  Bacterial infections.  Blockage of the salivary ducts due to a salivary stone.  Narrowing of the salivary ducts.  Swelling of the salivary ducts.  Dehydration.  Autoimmune conditions, such as sarcoidosis or Sjogren syndrome.  Air from activities such as scuba diving, glass blowing, or playing an instrument (rare).  Human immunodeficiency virus (HIV) or acquired immunodeficiency syndrome (AIDS).  Tuberculosis. SIGNS AND SYMPTOMS   The ears may appear to be pushed up and out from their normal position.  Redness (erythema) of the skin over the parotid glands.  Pain and tenderness over the parotid glands.  Swelling in the parotid gland area.  Yellowish-white fluid (pus) coming from the ducts inside the cheeks.  Dry mouth.  Bad taste in the mouth. DIAGNOSIS  Your health care provider may determine that you have parotitis based on your symptoms and a physical exam. A sample of fluid may also be taken from the parotid gland and tested to find the cause of your infection. X-rays or computed tomography (CT) scans may be taken if your health care provider thinks you might have a salivary stone blocking your salivary duct. TREATMENT  Treatment varies depending upon the cause of your parotitis. If your parotitis is caused by mumps, no treatment is needed. The condition will go away on its own after 7 to 10 days. In other cases, treatment may  include:  Antibiotic medicine if your infection was caused by bacteria.  Pain medicines.  Gland massage.  Eating sour candy to increase your saliva production.  Removal of salivary stones. Your health care provider may flush stones out with fluids or remove them with tweezers.  Surgery to remove the parotid glands. HOME CARE INSTRUCTIONS   If you were prescribed an antibiotic medicine, finish it all even if you start to feel better.  Put warm compresses on the sore area.  Take medicines only as directed by your health care provider.  Drink enough fluids to keep your urine clear or pale yellow. SEEK IMMEDIATE MEDICAL CARE IF:   You have increasing pain or swelling that is not controlled with medicine.  You have a fever. MAKE SURE YOU:  Understand these instructions.  Will watch your condition.  Will get help right away if you are not doing well or get worse.   This information is not intended to replace advice given to you by your health care provider. Make sure you discuss any questions you have with your health care provider.   Document Released: 11/10/2001 Document Revised: 06/11/2014 Document Reviewed: 10/14/2014 Elsevier Interactive Patient Education 2016 [employer]  

## 2015-04-25 NOTE — Assessment & Plan Note (Signed)
New- acute on chronic. Advised imaging and referral and ENT but she declined. Prefers supportive care with warm compresses, sour candy and Abx. eRx sent for keflex- see AVS. Call or return to clinic prn if these symptoms worsen or fail to improve as anticipated. The patient indicates understanding of these issues and agrees with the plan.

## 2015-04-25 NOTE — Progress Notes (Signed)
Subjective:   Patient ID: [name], female    DOB: 1946-02-02, 69 y.o.   MRN: [id number]  KEMPER CETINA is a pleasant 69 y.o. year old female who presents to clinic today with Neck Pain  on 04/25/2015  HPI:  Acute onset of left sided pain and swelling of left jaw.  Pain improves when she stops chewing.  Denies fever, nausea or vomiting. H/o of parotiditis in past- similar symptoms on same side.  Current Outpatient Prescriptions on File Prior to Visit  Medication Sig Dispense Refill  . albuterol (PROVENTIL HFA;VENTOLIN HFA) 108 (90 BASE) MCG/ACT inhaler Inhale 2 puffs into the lungs every 6 (six) hours as needed for wheezing or shortness of breath.    Marland Kitchen aspirin EC 81 MG tablet Take 81 mg by mouth daily.    . Calcium-Vitamin D (CALTRATE 600 PLUS-VIT D PO) Take 2 tablets by mouth 2 (two) times daily.     . citalopram (CELEXA) 20 MG tablet Take 1 tablet (20 mg total) by mouth daily. 30 tablet 3  . Insulin Lispro Prot & Lispro (HUMALOG MIX 75/25 KWIKPEN) (75-25) 100 UNIT/ML Kwikpen Inject 60 Units into the skin 2 (two) times daily.    . lansoprazole (PREVACID) 30 MG capsule Take 1 capsule (30 mg total) by mouth 2 (two) times daily before a meal. 180 capsule 3  . levothyroxine (SYNTHROID, LEVOTHROID) 150 MCG tablet Take 150 mcg by mouth daily before breakfast.    . metFORMIN (GLUCOPHAGE) 500 MG tablet Take 500 mg by mouth 2 (two) times daily with a meal.    . Omega-3 Fatty Acids (FISH OIL) 1200 MG CAPS Take by mouth.    [name] DELICA LANCETS 99991111 MISC USE TO CHECK BLOOD SUGAR THREE TIMES DAILY    . promethazine (PHENERGAN) 12.5 MG tablet TAKE ONE TO TWO TABLETS BY MOUTH EVERY 6 HOURS AS NEEDED FOR NAUSEA AND VOMITING 28 tablet 0  . traMADol (ULTRAM) 50 MG tablet TAKE ONE TABLET BY MOUTH EVERY 6 HOURS AS NEEDED FOR PAIN 50 tablet 0  . traZODone (DESYREL) 100 MG tablet TAKE TWO TABLETS BY MOUTH IN THE EVENING AT BEDTIME 60 tablet 5   No current facility-administered medications on file  prior to visit.    Allergies  Allergen Reactions  . Amitriptyline Other (See Comments)    Unknown reaction  . Benadryl [Diphenhydramine] Shortness Of Breath  . Demerol [Meperidine] Other (See Comments)    Unknown reaction  . Gabapentin Other (See Comments)    Unknown reaction  . Loratadine Other (See Comments)    Unknown reaction  . Meperidine Hcl Other (See Comments)    Unknown reaction  . Mirtazapine Other (See Comments)    Unknown reaction  . Olanzapine Other (See Comments)    Unknown reaction   . Voltaren [Diclofenac Sodium] Shortness Of Breath  . Zetia [Ezetimibe] Other (See Comments)    Weakness in legs, shakiness all over  . Ativan [Lorazepam] Other (See Comments)    Causes double vision at highter than .5 mg dose  . Atorvastatin Other (See Comments)    Muscle aches and weakness  . Budesonide-Formoterol Fumarate Other (See Comments)    Shakiness, tremors  . Bupropion Hcl Other (See Comments)    "cloud over me" depression  . Caffeine Other (See Comments)    jitters  . Codeine Sulfate Other (See Comments)    Makes chest hurt like a heart attack  . Lisinopril Cough  . Metformin Nausea And Vomiting  . Mometasone  Furoate Nausea And Vomiting  . Morphine Sulfate Other (See Comments)    Chest pain like a heart attack  . Other Other (See Comments)    Beta Blockers, reaction shortness of breath  . Oxycodone-Acetaminophen Nausea And Vomiting  . Pioglitazone Other (See Comments)    Cannot take because of risk of bladder cancer  . Propoxyphene N-Acetaminophen Nausea And Vomiting  . Rosuvastatin Other (See Comments)    Muscle aches and weakness  . Shellfish Allergy Diarrhea  . Venlafaxine Other (See Comments)    Unknown reaction  . Zolpidem Tartrate Other (See Comments)     Jittery, diarrhea  . Latex Rash    Past Medical History  Diagnosis Date  . Allergic rhinitis   . Diverticulitis of colon   . GERD (gastroesophageal reflux disease)   . Chronic depression   .  Hypercholesterolemia     intolerance of statins and niaspan  . OSA (obstructive sleep apnea)     mild, intolerant of cpap  . ACE-inhibitor cough   . Cataract   . Chronic headache   . Asthma, acute   . Hearing loss   . History of blood transfusion 2013  . Coronary artery disease     a. s/p CABG x2 (LIMA-LAD and SVG-RCA)  b. NST neg for ischemia 07/24/14  . Diabetes mellitus     type 2  . Anxiety   . PONV (postoperative nausea and vomiting)   . Thyroid disease   . Arthritis   . Anemia     iron deficiency anemia  . H/O aortic valve replacement     Past Surgical History  Procedure Laterality Date  . Cholecystectomy  2001  . Colectomy      lap sigmoid  . Tonsillectomy    . Bartholin gland cyst excision    . Tubal ligation    . Bladder suspension    . Breast cyst excision  1988    bilateral nonmalignant tumors, x3  . Vaginal delivery      3  . Thyroidectomy    . Abdominal hysterectomy w/ partial vaginactomy    . Appendectomy  1964  . Colonoscopy  2014    polyps found, 2 clamped off.  . Cardiac catheterization    . Coronary angioplasty  10/29/2007    Prox RCA & Mid Cx.  . Coronary artery bypass graft N/A 10/12/2013    Procedure: CORONARY ARTERY BYPASS GRAFT TIMES TWO;  Surgeon: [name], MD;  Location: [facility];  Service: Open Heart Surgery;  Laterality: N/A;  . Aortic valve replacement N/A 10/12/2013    Procedure: AORTIC VALVE REPLACEMENT (AVR);  Surgeon: [name], MD;  Location: [facility];  Service: Open Heart Surgery;  Laterality: N/A;  . Cataract extraction w/ intraocular lens  implant, bilateral    . Abdominal hysterectomy    . Sternal wires removal N/A 04/13/2014    Procedure: STERNAL WIRES REMOVAL;  Surgeon: [name], MD;  Location: [facility];  Service: Thoracic;  Laterality: N/A;  . Left heart catheterization with coronary angiogram N/A 10/09/2013    Procedure: LEFT HEART CATHETERIZATION WITH CORONARY ANGIOGRAM;  Surgeon: [name], MD;  Location:  [facility] CATH LAB;  Service: Cardiovascular;  Laterality: N/A;    Family History  Problem Relation Age of Onset  . Breast cancer Mother     breast  . Hypertension Father   . Cancer Father     mesothelioma  . Asthma Father   . Stroke Paternal Grandfather   .  Heart disease      Social History   Social History  . Marital Status: Married    Spouse Name: N/A  . Number of Children: 3  . Years of Education: N/A   Occupational History  . Retired     Quarry manager   Social History Main Topics  . Smoking status: Former Smoker -- 0.50 packs/day for 30 years    Types: Cigarettes    Quit date: 02/03/2008  . Smokeless tobacco: Never Used  . Alcohol Use: No  . Drug Use: No  . Sexual Activity: Not Currently   Other Topics Concern  . Not on file   Social History Narrative   Does not have Living Will   Desires CPR, would not want prolonged life support if futile.   The PMH, PSH, Social History, Family History, Medications, and allergies have been reviewed in [facility], and have been updated if relevant.   Review of Systems  Constitutional: Negative.   HENT: Negative for drooling and trouble swallowing.   Musculoskeletal: Negative.   Skin: Negative.   Neurological: Negative.   Psychiatric/Behavioral: Negative.   All other systems reviewed and are negative.      Objective:    BP 124/74 mmHg  Pulse 62  Temp(Src) 97.9 F (36.6 C) (Oral)  Wt 193 lb 4 oz (87.658 kg)  SpO2 96%   Physical Exam  Constitutional: She is oriented to person, place, and time. She appears well-developed and well-nourished. No distress.  HENT:  Head: normal shape and size; Throat/Mouth: Dentures present, buccal mucosa pink and moist, no stones noted. Left parotid swollen and very tender to touch. No redness or warmth noted.  Eyes: Conjunctivae are normal.  Cardiovascular: Normal rate.   Pulmonary/Chest: Effort normal.  Musculoskeletal: Normal range of motion.  Neurological: She is alert and oriented to person,  place, and time.  Skin: Skin is warm and dry.  Psychiatric: She has a normal mood and affect. Her behavior is normal. Judgment normal.  Nursing note and vitals reviewed.         Assessment & Plan:   Parotitis - Plan: cephALEXin (KEFLEX) 500 MG capsule  Parotiditis No Follow-up on file.

## 2015-04-27 DIAGNOSIS — E119 Type 2 diabetes mellitus without complications: Secondary | ICD-10-CM | POA: Diagnosis not present

## 2015-05-04 DIAGNOSIS — Z794 Long term (current) use of insulin: Secondary | ICD-10-CM | POA: Diagnosis not present

## 2015-05-04 DIAGNOSIS — F329 Major depressive disorder, single episode, unspecified: Secondary | ICD-10-CM | POA: Diagnosis not present

## 2015-05-04 DIAGNOSIS — F419 Anxiety disorder, unspecified: Secondary | ICD-10-CM | POA: Diagnosis not present

## 2015-05-04 DIAGNOSIS — E039 Hypothyroidism, unspecified: Secondary | ICD-10-CM | POA: Diagnosis not present

## 2015-05-04 DIAGNOSIS — E119 Type 2 diabetes mellitus without complications: Secondary | ICD-10-CM | POA: Diagnosis not present

## 2015-05-18 ENCOUNTER — Other Ambulatory Visit: Payer: Self-pay | Admitting: Family Medicine

## 2015-05-18 MED ORDER — ALBUTEROL SULFATE HFA 108 (90 BASE) MCG/ACT IN AERS: 2.0000 | INHALATION_SPRAY | Freq: Four times a day (QID) | RESPIRATORY_TRACT | Status: DC | PRN

## 2015-05-18 NOTE — Telephone Encounter (Signed)
Pt request refill albuterol inhaler; occasionally has wheezing at night; pt said does not have difficulty breathing and if wheezing gets more often will cb for appt. Refill done per protocol. Last seen 04/25/15.[facility] [address].

## 2015-06-02 ENCOUNTER — Encounter: Payer: Self-pay | Admitting: Family Medicine

## 2015-06-02 ENCOUNTER — Ambulatory Visit (INDEPENDENT_AMBULATORY_CARE_PROVIDER_SITE_OTHER): Payer: Medicare Other | Admitting: Family Medicine

## 2015-06-02 VITALS — BP 122/72 | HR 64 | Temp 97.7°F | Wt 191.8 lb

## 2015-06-02 DIAGNOSIS — J4531 Mild persistent asthma with (acute) exacerbation: Secondary | ICD-10-CM | POA: Diagnosis not present

## 2015-06-02 DIAGNOSIS — I2510 Atherosclerotic heart disease of native coronary artery without angina pectoris: Secondary | ICD-10-CM | POA: Diagnosis not present

## 2015-06-02 DIAGNOSIS — M2550 Pain in unspecified joint: Secondary | ICD-10-CM | POA: Diagnosis not present

## 2015-06-02 DIAGNOSIS — J4521 Mild intermittent asthma with (acute) exacerbation: Secondary | ICD-10-CM

## 2015-06-02 MED ORDER — PREDNISONE 10 MG PO TABS: ORAL_TABLET | ORAL | Status: DC

## 2015-06-02 NOTE — Patient Instructions (Signed)
Great to see you. Happy New Year.  Take prednisone as directed.

## 2015-06-02 NOTE — Progress Notes (Signed)
Pre visit review using our clinic review tool, if applicable. No additional management support is needed unless otherwise documented below in the visit note. 

## 2015-06-02 NOTE — Progress Notes (Signed)
HPI  Pt presents to the clinic today with c/o cough and wheezing. She reports this started 1 week ago.  She has had nasal drainage as well.  She has been using her inhaler but has not tried anything OTC. She does have a history of allergies, sleep apnea, asthma.  No longer smokes.  UTD vaccinations.        Past Medical History  Diagnosis Date  . Allergic rhinitis   . Diverticulitis of colon   . GERD (gastroesophageal reflux disease)   . Chronic depression   . Hypercholesterolemia     intolerance of statins and niaspan  . OSA (obstructive sleep apnea)     mild, intolerant of cpap  . ACE-inhibitor cough   . Cataract   . Chronic headache   . Asthma, acute   . Hearing loss   . History of blood transfusion 2013  . Coronary artery disease     a. s/p CABG x2 (LIMA-LAD and SVG-RCA)  b. NST neg for ischemia 07/24/14  . Diabetes mellitus     type 2  . Anxiety   . PONV (postoperative nausea and vomiting)   . Thyroid disease   . Arthritis   . Anemia     iron deficiency anemia  . H/O aortic valve replacement     Family History  Problem Relation Age of Onset  . Breast cancer Mother     breast  . Hypertension Father   . Cancer Father     mesothelioma  . Asthma Father   . Stroke Paternal Grandfather   . Heart disease      Social History   Social History  . Marital Status: Married    Spouse Name: N/A  . Number of Children: 3  . Years of Education: N/A   Occupational History  . Retired     Quarry manager   Social History Main Topics  . Smoking status: Former Smoker -- 0.50 packs/day for 30 years    Types: Cigarettes    Quit date: 02/03/2008  . Smokeless tobacco: Never Used  . Alcohol Use: No  . Drug Use: No  . Sexual Activity: Not Currently   Other Topics Concern  . Not on file   Social History Narrative   Does not have Living Will   Desires CPR, would not want prolonged life support if futile.    Allergies  Allergen Reactions  . Amitriptyline Other (See Comments)    Unknown reaction  . Benadryl [Diphenhydramine] Shortness Of Breath  . Demerol [Meperidine] Other (See Comments)    Unknown reaction  . Gabapentin Other (See Comments)    Unknown reaction  . Loratadine Other (See Comments)    Unknown reaction  . Meperidine Hcl Other (See Comments)    Unknown reaction  . Mirtazapine Other (See Comments)    Unknown reaction  . Olanzapine Other (See Comments)    Unknown reaction   . Voltaren [Diclofenac Sodium] Shortness Of Breath  . Zetia [Ezetimibe] Other (See Comments)    Weakness in legs, shakiness all over  . Ativan [Lorazepam] Other (See Comments)    Causes double vision at highter than .5 mg dose  . Atorvastatin Other (See Comments)    Muscle aches and weakness  . Budesonide-Formoterol Fumarate Other (See Comments)    Shakiness, tremors  . Bupropion Hcl Other (See Comments)    "cloud over me" depression  . Caffeine Other (See Comments)    jitters  . Codeine Sulfate Other (See Comments)    Makes  chest hurt like a heart attack  . Lisinopril Cough  . Metformin Nausea And Vomiting  . Mometasone Furoate Nausea And Vomiting  . Morphine Sulfate Other (See Comments)    Chest pain like a heart attack  . Other Other (See Comments)    Beta Blockers, reaction shortness of breath  . Oxycodone-Acetaminophen Nausea And Vomiting  . Pioglitazone Other (See Comments)    Cannot take because of risk of bladder cancer  . Propoxyphene N-Acetaminophen Nausea And Vomiting  . Rosuvastatin Other (See Comments)    Muscle aches and weakness  . Shellfish Allergy Diarrhea  . Tramadol Nausea Only  . Venlafaxine Other (See Comments)    Unknown reaction  . Zolpidem Tartrate Other (See Comments)     Jittery, diarrhea  . Latex Rash    Review of Systems  Constitutional: Positive for fatigue. Negative for fever.  HENT: Positive for congestion, postnasal drip and sinus pressure. Negative for trouble swallowing and voice change.   Respiratory: Positive for cough,  shortness of breath and wheezing. Negative for stridor.   Cardiovascular: Negative.   Gastrointestinal: Negative.   Musculoskeletal: Negative.   Skin: Negative.   Neurological: Negative.   All other systems reviewed and are negative.    Objective:   BP 122/72 mmHg  Pulse 64  Temp(Src) 97.7 F (36.5 C) (Oral)  Wt 191 lb 12 oz (86.977 kg)  SpO2 95% Wt Readings from Last 3 Encounters:  06/02/15 191 lb 12 oz (86.977 kg)  04/25/15 193 lb 4 oz (87.658 kg)  03/24/15 190 lb 12 oz (86.524 kg)     General: Appears herstated age, well developed, well nourished in NAD. HEENT: Head: normal shape and size, no sinus tenderness noted; Eyes: sclera white, no icterus, conjunctiva pink, PERRLA and EOMs intact; Ears: Tm's gray and intact, normal light reflex; Throat/Mouth: Teeth present, mucosa pink and moist, no exudate noted, no lesions or ulcerations noted.  Neck: No lymphadenopathy. Cardiovascular: Normal rate and rhythm. S1,S2 noted.  No rubs or gallops noted.  Pulmonary/Chest: Normal effort and bilateral expiratory wheeze noted bilaterally. No respiratory distress. No rales or ronchi noted.  MSK: Decreased internal and external rotation of bilateral shoulders. Some pain noted with palpation of the AC joints bilaterally. Strength 5/5 BUE. Hand grips equal.     Assessment & Plan:

## 2015-06-02 NOTE — Assessment & Plan Note (Addendum)
With acute exacerbation. Continue Albuterol prn eRx for pred taper x 6 days Call or return to clinic prn if these symptoms worsen or fail to improve as anticipated. The patient indicates understanding of these issues and agrees with the plan.

## 2015-06-08 ENCOUNTER — Emergency Department (HOSPITAL_COMMUNITY): Payer: Medicare Other

## 2015-06-08 ENCOUNTER — Encounter (HOSPITAL_COMMUNITY): Payer: Self-pay | Admitting: *Deleted

## 2015-06-08 DIAGNOSIS — I2510 Atherosclerotic heart disease of native coronary artery without angina pectoris: Secondary | ICD-10-CM | POA: Diagnosis not present

## 2015-06-08 DIAGNOSIS — H9190 Unspecified hearing loss, unspecified ear: Secondary | ICD-10-CM | POA: Insufficient documentation

## 2015-06-08 DIAGNOSIS — R0602 Shortness of breath: Secondary | ICD-10-CM | POA: Diagnosis not present

## 2015-06-08 DIAGNOSIS — J45909 Unspecified asthma, uncomplicated: Secondary | ICD-10-CM | POA: Diagnosis not present

## 2015-06-08 DIAGNOSIS — R531 Weakness: Secondary | ICD-10-CM | POA: Diagnosis not present

## 2015-06-08 DIAGNOSIS — E119 Type 2 diabetes mellitus without complications: Secondary | ICD-10-CM | POA: Diagnosis not present

## 2015-06-08 LAB — BASIC METABOLIC PANEL
Anion gap: 9 (ref 5–15)
BUN: 21 mg/dL — AB (ref 6–20)
CO2: 25 mmol/L (ref 22–32)
Calcium: 8.6 mg/dL — ABNORMAL LOW (ref 8.9–10.3)
Chloride: 103 mmol/L (ref 101–111)
Creatinine, Ser: 1.02 mg/dL — ABNORMAL HIGH (ref 0.44–1.00)
GFR calc Af Amer: >60 mL/min (ref >60)
GFR calc non Af Amer: 54 mL/min — ABNORMAL LOW (ref >60)
Glucose, Bld: 169 mg/dL — ABNORMAL HIGH (ref 65–99)
Potassium: 4.3 mmol/L (ref 3.5–5.1)
SODIUM: 137 mmol/L (ref 135–145)

## 2015-06-08 LAB — CBC
HEMATOCRIT: 39 % (ref 36.0–46.0)
HEMOGLOBIN: 12.7 g/dL (ref 12.0–15.0)
MCH: 27.7 pg (ref 26.0–34.0)
MCHC: 32.6 g/dL (ref 30.0–36.0)
MCV: 85 fL (ref 78.0–100.0)
Platelets: 188 10*3/uL (ref 150–400)
RBC: 4.59 MIL/uL (ref 3.87–5.11)
RDW: 13.9 % (ref 11.5–15.5)
WBC: 11.9 10*3/uL — ABNORMAL HIGH (ref 4.0–10.5)

## 2015-06-08 LAB — I-STAT TROPONIN, ED: Troponin i, poc: 0 ng/mL (ref 0.00–0.08)

## 2015-06-08 NOTE — ED Notes (Signed)
Pt c/o SOB, feeling jittery, intermittent chest pain and having to sleep sitting up x 3 days.

## 2015-06-09 ENCOUNTER — Emergency Department (HOSPITAL_COMMUNITY)
Admission: EM | Admit: 2015-06-09 | Discharge: 2015-06-09 | Discharge status: Against Medical Advice | Payer: Medicare Other | Attending: Emergency Medicine | Admitting: Emergency Medicine

## 2015-06-09 NOTE — ED Notes (Signed)
No response in lobby for room placement.

## 2015-06-09 NOTE — ED Notes (Signed)
Called for vital sign reassessment, no answer.  

## 2015-06-10 DIAGNOSIS — J301 Allergic rhinitis due to pollen: Secondary | ICD-10-CM | POA: Diagnosis not present

## 2015-06-15 DIAGNOSIS — J301 Allergic rhinitis due to pollen: Secondary | ICD-10-CM | POA: Diagnosis not present

## 2015-06-17 DIAGNOSIS — J301 Allergic rhinitis due to pollen: Secondary | ICD-10-CM | POA: Diagnosis not present

## 2015-06-17 DIAGNOSIS — J019 Acute sinusitis, unspecified: Secondary | ICD-10-CM | POA: Diagnosis not present

## 2015-06-22 ENCOUNTER — Ambulatory Visit (INDEPENDENT_AMBULATORY_CARE_PROVIDER_SITE_OTHER): Payer: Medicare Other | Admitting: Internal Medicine

## 2015-06-22 ENCOUNTER — Encounter: Payer: Self-pay | Admitting: Internal Medicine

## 2015-06-22 ENCOUNTER — Inpatient Hospital Stay: Payer: Medicare Other | Attending: Internal Medicine

## 2015-06-22 VITALS — BP 132/80 | HR 74 | Ht 66.0 in | Wt 189.6 lb

## 2015-06-22 DIAGNOSIS — J449 Chronic obstructive pulmonary disease, unspecified: Secondary | ICD-10-CM | POA: Diagnosis not present

## 2015-06-22 DIAGNOSIS — D509 Iron deficiency anemia, unspecified: Secondary | ICD-10-CM | POA: Insufficient documentation

## 2015-06-22 DIAGNOSIS — R911 Solitary pulmonary nodule: Secondary | ICD-10-CM | POA: Diagnosis not present

## 2015-06-22 LAB — IRON AND TIBC
IRON: 38 ug/dL (ref 28–170)
Saturation Ratios: 13 % (ref 10.4–31.8)
TIBC: 285 ug/dL (ref 250–450)
UIBC: 247 ug/dL

## 2015-06-22 LAB — CBC WITH DIFFERENTIAL/PLATELET
BASOS ABS: 0.1 10*3/uL (ref 0–0.1)
BASOS PCT: 1 %
EOS PCT: 3 %
Eosinophils Absolute: 0.3 10*3/uL (ref 0–0.7)
HCT: 37 % (ref 35.0–47.0)
Hemoglobin: 12.1 g/dL (ref 12.0–16.0)
Lymphocytes Relative: 23 %
Lymphs Abs: 2 10*3/uL (ref 1.0–3.6)
MCH: 27.1 pg (ref 26.0–34.0)
MCHC: 32.8 g/dL (ref 32.0–36.0)
MCV: 82.7 fL (ref 80.0–100.0)
MONO ABS: 0.6 10*3/uL (ref 0.2–0.9)
Monocytes Relative: 6 %
Neutro Abs: 5.8 10*3/uL (ref 1.4–6.5)
Neutrophils Relative %: 67 %
PLATELETS: 191 10*3/uL (ref 150–440)
RBC: 4.48 MIL/uL (ref 3.80–5.20)
RDW: 14.6 % — AB (ref 11.5–14.5)
WBC: 8.8 10*3/uL (ref 3.6–11.0)

## 2015-06-22 LAB — FERRITIN: Ferritin: 288 ng/mL (ref 11–307)

## 2015-06-22 MED ORDER — FLUTICASONE FUROATE-VILANTEROL 100-25 MCG/INH IN AEPB
1.0000 | INHALATION_SPRAY | Freq: Every day | RESPIRATORY_TRACT | Status: AC
Start: 2015-06-22 — End: 2015-06-23

## 2015-06-22 NOTE — Progress Notes (Signed)
[facility] Consultation      Date: 06/22/2015,   MRN# [id number] CRISOL MELIAN 1945-10-08 Code Status:  Code Status History    Date Active Date Inactive Code Status Order ID Comments User Context   07/24/2014 12:10 AM 07/24/2014  7:45 PM Full Code [id number]  Manus Gunning, MD Inpatient   10/12/2013  8:32 PM 10/15/2013  7:36 PM Full Code [id number]  John Giovanni, PA-C Inpatient   10/09/2013  7:30 PM 10/12/2013  8:32 PM Full Code [id number]  [name], MD Inpatient   10/08/2013 11:12 PM 10/09/2013  7:30 PM Full Code [id number]  Stephani Police, MD ED     [facility] day:@LENGTHOFSTAYDAYS @ Referring MD: @ATDPROV @     PCP:      AdmissionWeight: 189 lb 9.6 oz (86.002 kg)                 CurrentWeight: 189 lb 9.6 oz (86.002 kg) RAECHELL FOELLER is a 70 y.o. old female seen in consultation for SOB and cough.     CHIEF COMPLAINT:   Cough and wheezing   HISTORY OF PRESENT ILLNESS  70 yo white female with long standing smoking history apporx 30 pack year Seen today for cough, wheezing and some SOB lasting for past 5 weeks It all started with acute URI symptoms 5 weeks ago Patient was given abx and steroids and she states that she feels better, but still with cough and wheezing She uses albuterol as needed No signs of infection at this time-no fevers,chills No lower ext swelling  Has h/o OSA and noncomplaint due to claustrophobia Takes allergy shots given bu ENT  Quit tobacco in 2015 when she had her CABG woked in [employer] for 10 years  Has had a lung nodule found 30 years ago Previously saw Dr [place] Bells in [place]   PAST MEDICAL HISTORY   Past Medical History  Diagnosis Date  . Allergic rhinitis   . Diverticulitis of colon   . GERD (gastroesophageal reflux disease)   . Chronic depression   . Hypercholesterolemia     intolerance of statins and niaspan  . OSA (obstructive sleep apnea)     mild, intolerant of cpap  . ACE-inhibitor cough   . Cataract   .  Chronic headache   . Hearing loss   . History of blood transfusion 2013  . Coronary artery disease     a. s/p CABG x2 (LIMA-LAD and SVG-RCA)  b. NST neg for ischemia 07/24/14  . Diabetes mellitus     type 2  . Anxiety   . PONV (postoperative nausea and vomiting)   . Thyroid disease   . Arthritis   . Anemia     iron deficiency anemia  . H/O aortic valve replacement   . COPD (chronic obstructive pulmonary disease) ([place])      SURGICAL HISTORY   Past Surgical History  Procedure Laterality Date  . Cholecystectomy  2001  . Colectomy      lap sigmoid  . Tonsillectomy    . Bartholin gland cyst excision    . Tubal ligation    . Bladder suspension    . Breast cyst excision  1988    bilateral nonmalignant tumors, x3  . Vaginal delivery      3  . Thyroidectomy    . Abdominal hysterectomy w/ partial vaginactomy    . Appendectomy  1964  . Colonoscopy  2014    polyps found, 2 clamped off.  . Cardiac catheterization    .  Coronary angioplasty  10/29/2007    Prox RCA & Mid Cx.  . Coronary artery bypass graft N/A 10/12/2013    Procedure: CORONARY ARTERY BYPASS GRAFT TIMES TWO;  Surgeon: [name], MD;  Location: [facility];  Service: Open Heart Surgery;  Laterality: N/A;  . Aortic valve replacement N/A 10/12/2013    Procedure: AORTIC VALVE REPLACEMENT (AVR);  Surgeon: [name], MD;  Location: [place];  Service: Open Heart Surgery;  Laterality: N/A;  . Cataract extraction w/ intraocular lens  implant, bilateral    . Abdominal hysterectomy    . Sternal wires removal N/A 04/13/2014    Procedure: STERNAL WIRES REMOVAL;  Surgeon: [name], MD;  Location: [facility] OR;  Service: Thoracic;  Laterality: N/A;  . Left heart catheterization with coronary angiogram N/A 10/09/2013    Procedure: LEFT HEART CATHETERIZATION WITH CORONARY ANGIOGRAM;  Surgeon: [name], MD;  Location: [facility] CATH LAB;  Service: Cardiovascular;  Laterality: N/A;     FAMILY HISTORY   Family History  Problem  Relation Age of Onset  . Breast cancer Mother     breast  . Hypertension Father   . Cancer Father     mesothelioma  . Asthma Father   . Stroke Paternal Grandfather   . Heart disease       SOCIAL HISTORY   Social History  Substance Use Topics  . Smoking status: Former Smoker -- 0.50 packs/day for 30 years    Types: Cigarettes    Quit date: 10/02/2013  . Smokeless tobacco: Never Used  . Alcohol Use: No     MEDICATIONS    Home Medication:  Current Outpatient Rx  Name  Route  Sig  Dispense  Refill  . albuterol (PROVENTIL HFA;VENTOLIN HFA) 108 (90 BASE) MCG/ACT inhaler   Inhalation   Inhale 2 puffs into the lungs every 6 (six) hours as needed for wheezing or shortness of breath.   18 g   1   . aspirin EC 81 MG tablet   Oral   Take 81 mg by mouth daily.         . Calcium-Vitamin D (CALTRATE 600 PLUS-VIT D PO)   Oral   Take 2 tablets by mouth 2 (two) times daily.          . citalopram (CELEXA) 20 MG tablet      TAKE ONE TABLET BY MOUTH ONCE DAILY   30 tablet   5   . Insulin Lispro Prot & Lispro (HUMALOG MIX 75/25 KWIKPEN) (75-25) 100 UNIT/ML Kwikpen   Subcutaneous   Inject 60 Units into the skin 2 (two) times daily.         . lansoprazole (PREVACID) 30 MG capsule   Oral   Take 1 capsule (30 mg total) by mouth 2 (two) times daily before a meal.   180 capsule   3   . levothyroxine (SYNTHROID, LEVOTHROID) 150 MCG tablet   Oral   Take 150 mcg by mouth daily before breakfast.         . ONETOUCH DELICA LANCETS 99991111 MISC      USE TO CHECK BLOOD SUGAR THREE TIMES DAILY         . promethazine (PHENERGAN) 12.5 MG tablet      TAKE ONE TO TWO TABLETS BY MOUTH EVERY 6 HOURS AS NEEDED FOR NAUSEA AND VOMITING   28 tablet   0     Dispense as written.   . traZODone (DESYREL) 100 MG tablet  TAKE TWO TABLETS BY MOUTH IN THE EVENING AT BEDTIME   60 tablet   5     Dispense as written.     Current Medication:  Current outpatient prescriptions:    .  albuterol (PROVENTIL HFA;VENTOLIN HFA) 108 (90 BASE) MCG/ACT inhaler, Inhale 2 puffs into the lungs every 6 (six) hours as needed for wheezing or shortness of breath., Disp: 18 g, Rfl: 1 .  aspirin EC 81 MG tablet, Take 81 mg by mouth daily., Disp: , Rfl:  .  Calcium-Vitamin D (CALTRATE 600 PLUS-VIT D PO), Take 2 tablets by mouth 2 (two) times daily. , Disp: , Rfl:  .  citalopram (CELEXA) 20 MG tablet, TAKE ONE TABLET BY MOUTH ONCE DAILY, Disp: 30 tablet, Rfl: 5 .  Insulin Lispro Prot & Lispro (HUMALOG MIX 75/25 KWIKPEN) (75-25) 100 UNIT/ML Kwikpen, Inject 60 Units into the skin 2 (two) times daily., Disp: , Rfl:  .  lansoprazole (PREVACID) 30 MG capsule, Take 1 capsule (30 mg total) by mouth 2 (two) times daily before a meal., Disp: 180 capsule, Rfl: 3 .  levothyroxine (SYNTHROID, LEVOTHROID) 150 MCG tablet, Take 150 mcg by mouth daily before breakfast., Disp: , Rfl:  .  ONETOUCH DELICA LANCETS 99991111 MISC, USE TO CHECK BLOOD SUGAR THREE TIMES DAILY, Disp: , Rfl:  .  promethazine (PHENERGAN) 12.5 MG tablet, TAKE ONE TO TWO TABLETS BY MOUTH EVERY 6 HOURS AS NEEDED FOR NAUSEA AND VOMITING, Disp: 28 tablet, Rfl: 0 .  traZODone (DESYREL) 100 MG tablet, TAKE TWO TABLETS BY MOUTH IN THE EVENING AT BEDTIME, Disp: 60 tablet, Rfl: 5    ALLERGIES   Amitriptyline; Benadryl; Demerol; Gabapentin; Loratadine; Meperidine hcl; Mirtazapine; Olanzapine; Voltaren; Zetia; Ativan; Atorvastatin; Budesonide-formoterol fumarate; Bupropion hcl; Caffeine; Codeine sulfate; Lisinopril; Metformin; Mometasone furoate; Morphine sulfate; Other; Oxycodone-acetaminophen; Pioglitazone; Propoxyphene n-acetaminophen; Rosuvastatin; Shellfish allergy; Tramadol; Venlafaxine; Zolpidem tartrate; and Latex     REVIEW OF SYSTEMS   Review of Systems  Constitutional: Negative for fever, chills, weight loss, malaise/fatigue and diaphoresis.  HENT: Negative for congestion and sore throat.   Respiratory: Positive for cough, shortness of  breath and wheezing. Negative for hemoptysis and sputum production.   Cardiovascular: Negative for chest pain, orthopnea and leg swelling.  Gastrointestinal: Negative for nausea, vomiting and abdominal pain.  Musculoskeletal: Negative.   Skin: Negative for rash.  Neurological: Negative for dizziness, focal weakness and headaches.  Psychiatric/Behavioral: The patient is nervous/anxious.      VS: BP 132/80 mmHg  Pulse 74  Ht 5\' 6"  (1.676 m)  Wt 189 lb 9.6 oz (86.002 kg)  BMI 30.62 kg/m2  SpO2 95%     PHYSICAL EXAM  Physical Exam  Constitutional: She is oriented to person, place, and time. She appears well-developed and well-nourished. No distress.  HENT:  Head: Normocephalic and atraumatic.  Mouth/Throat: No oropharyngeal exudate.  Eyes: EOM are normal. Pupils are equal, round, and reactive to light. No scleral icterus.  Neck: Normal range of motion. Neck supple.  Cardiovascular: Normal rate, regular rhythm and normal heart sounds.   No murmur heard. Pulmonary/Chest: No stridor. No respiratory distress. She has no wheezes. She has no rales.  Abdominal: Soft. Bowel sounds are normal.  Musculoskeletal: Normal range of motion. She exhibits no edema.  Neurological: She is alert and oriented to person, place, and time. She displays normal reflexes. Coordination normal.  Skin: Skin is warm. She is not diaphoretic.  Psychiatric: She has a normal mood and affect.  IMAGING    Dg Chest 2 View  06/08/2015  CLINICAL DATA:  Three-day history of shortness of breath. Chest tightness. EXAM: CHEST  2 VIEW COMPARISON:  July 23, 2014 FINDINGS: There is no edema or consolidation. Heart is upper normal in size with pulmonary vascularity within normal limits. Patient is status post aortic valve replacement. No adenopathy. No bone lesions. No pneumothorax. IMPRESSION: No edema or consolidation.  Status post aortic valve replacement. Electronically Signed   By: Lowella Grip III  M.D.   On: 06/08/2015 20:02     CXR on 1/4 images reveiwed 06/22/2015   ASSESSMENT/PLAN   70 yo white female with long standing smoking history with SOB, Cough and wheezing suggestive of COPD  1.start Breo 2.albuterol asn needed 3.will need to obtain PFT's and 6 MWT to assess lung function 4.will need CT chest to assess for lung mass/nodule   I have personally obtained a history, examined the patient, evaluated laboratory and independently reviewed imaging results, formulated the assessment and plan and placed orders.  The Patient requires high complexity decision making for assessment and support, frequent evaluation and titration of therapies, application of advanced monitoring technologies and extensive interpretation of multiple databases. Patient/Family are satisfied with Plan of action and management. All questions answered  Corrin Parker, M.D.  [facility]  Medical Director [place] Director [facility] Cardio-Pulmonary Department

## 2015-06-22 NOTE — Patient Instructions (Signed)
Chronic Obstructive Pulmonary Disease Chronic obstructive pulmonary disease (COPD) is a common lung condition in which airflow from the lungs is limited. COPD is a general term that can be used to describe many different lung problems that limit airflow, including both chronic bronchitis and emphysema. If you have COPD, your lung function will probably never return to normal, but there are measures you can take to improve lung function and make yourself feel better. CAUSES   Smoking (common).  Exposure to secondhand smoke.  Genetic problems.  Chronic inflammatory lung diseases or recurrent infections. SYMPTOMS  Shortness of breath, especially with physical activity.  Deep, persistent (chronic) cough with a large amount of thick mucus.  Wheezing.  Rapid breaths (tachypnea).  Gray or bluish discoloration (cyanosis) of the skin, especially in your fingers, toes, or lips.  Fatigue.  Weight loss.  Frequent infections or episodes when breathing symptoms become much worse (exacerbations).  Chest tightness. DIAGNOSIS Your health care provider will take a medical history and perform a physical examination to diagnose COPD. Additional tests for COPD may include:  Lung (pulmonary) function tests.  Chest X-ray.  CT scan.  Blood tests. TREATMENT  Treatment for COPD may include:  Inhaler and nebulizer medicines. These help manage the symptoms of COPD and make your breathing more comfortable.  Supplemental oxygen. Supplemental oxygen is only helpful if you have a low oxygen level in your blood.  Exercise and physical activity. These are beneficial for nearly all people with COPD.  Lung surgery or transplant.  Nutrition therapy to gain weight, if you are underweight.  Pulmonary rehabilitation. This may involve working with a team of health care providers and specialists, such as respiratory, occupational, and physical therapists. HOME CARE INSTRUCTIONS  Take all medicines  (inhaled or pills) as directed by your health care provider.  Avoid over-the-counter medicines or cough syrups that dry up your airway (such as antihistamines) and slow down the elimination of secretions unless instructed otherwise by your health care provider.  If you are a smoker, the most important thing that you can do is stop smoking. Continuing to smoke will cause further lung damage and breathing trouble. Ask your health care provider for help with quitting smoking. He or she can direct you to community resources or hospitals that provide support.  Avoid exposure to irritants such as smoke, chemicals, and fumes that aggravate your breathing.  Use oxygen therapy and pulmonary rehabilitation if directed by your health care provider. If you require home oxygen therapy, ask your health care provider whether you should purchase a pulse oximeter to measure your oxygen level at home.  Avoid contact with individuals who have a contagious illness.  Avoid extreme temperature and humidity changes.  Eat healthy foods. Eating smaller, more frequent meals and resting before meals may help you maintain your strength.  Stay active, but balance activity with periods of rest. Exercise and physical activity will help you maintain your ability to do things you want to do.  Preventing infection and hospitalization is very important when you have COPD. Make sure to receive all the vaccines your health care provider recommends, especially the pneumococcal and influenza vaccines. Ask your health care provider whether you need a pneumonia vaccine.  Learn and use relaxation techniques to manage stress.  Learn and use controlled breathing techniques as directed by your health care provider. Controlled breathing techniques include:  Pursed lip breathing. Start by breathing in (inhaling) through your nose for 1 second. Then, purse your lips as if you were   going to whistle and breathe out (exhale) through the  pursed lips for 2 seconds.  Diaphragmatic breathing. Start by putting one hand on your abdomen just above your waist. Inhale slowly through your nose. The hand on your abdomen should move out. Then purse your lips and exhale slowly. You should be able to feel the hand on your abdomen moving in as you exhale.  Learn and use controlled coughing to clear mucus from your lungs. Controlled coughing is a series of short, progressive coughs. The steps of controlled coughing are: 1. Lean your head slightly forward. 2. Breathe in deeply using diaphragmatic breathing. 3. Try to hold your breath for 3 seconds. 4. Keep your mouth slightly open while coughing twice. 5. Spit any mucus out into a tissue. 6. Rest and repeat the steps once or twice as needed. SEEK MEDICAL CARE IF:  You are coughing up more mucus than usual.  There is a change in the color or thickness of your mucus.  Your breathing is more labored than usual.  Your breathing is faster than usual. SEEK IMMEDIATE MEDICAL CARE IF:  You have shortness of breath while you are resting.  You have shortness of breath that prevents you from:  Being able to talk.  Performing your usual physical activities.  You have chest pain lasting longer than 5 minutes.  Your skin color is more cyanotic than usual.  You measure low oxygen saturations for longer than 5 minutes with a pulse oximeter. MAKE SURE YOU:  Understand these instructions.  Will watch your condition.  Will get help right away if you are not doing well or get worse.   This information is not intended to replace advice given to you by your health care provider. Make sure you discuss any questions you have with your health care provider.   Document Released: 02/28/2005 Document Revised: 06/11/2014 Document Reviewed: 01/15/2013 Elsevier Interactive Patient Education 2016 Elsevier Inc.  

## 2015-07-06 DIAGNOSIS — F329 Major depressive disorder, single episode, unspecified: Secondary | ICD-10-CM | POA: Diagnosis not present

## 2015-07-06 DIAGNOSIS — F419 Anxiety disorder, unspecified: Secondary | ICD-10-CM | POA: Diagnosis not present

## 2015-07-06 DIAGNOSIS — E1165 Type 2 diabetes mellitus with hyperglycemia: Secondary | ICD-10-CM | POA: Diagnosis not present

## 2015-07-06 DIAGNOSIS — E039 Hypothyroidism, unspecified: Secondary | ICD-10-CM | POA: Diagnosis not present

## 2015-07-06 DIAGNOSIS — Z794 Long term (current) use of insulin: Secondary | ICD-10-CM | POA: Diagnosis not present

## 2015-07-14 ENCOUNTER — Other Ambulatory Visit: Payer: Self-pay | Admitting: Family Medicine

## 2015-07-14 DIAGNOSIS — E113393 Type 2 diabetes mellitus with moderate nonproliferative diabetic retinopathy without macular edema, bilateral: Secondary | ICD-10-CM | POA: Diagnosis not present

## 2015-07-19 ENCOUNTER — Other Ambulatory Visit: Payer: Self-pay | Admitting: Internal Medicine

## 2015-07-19 ENCOUNTER — Ambulatory Visit
Admission: RE | Admit: 2015-07-19 | Discharge: 2015-07-19 | Disposition: A | Discharge status: Home / Self Care | Payer: Medicare Other | Source: Ambulatory Visit | Attending: Internal Medicine | Admitting: Internal Medicine

## 2015-07-19 DIAGNOSIS — N63 Unspecified lump in breast: Secondary | ICD-10-CM | POA: Insufficient documentation

## 2015-07-19 DIAGNOSIS — R911 Solitary pulmonary nodule: Secondary | ICD-10-CM | POA: Diagnosis not present

## 2015-07-19 HISTORY — DX: Unspecified asthma, uncomplicated: J45.909

## 2015-07-19 LAB — POCT I-STAT CREATININE: Creatinine, Ser: 0.9 mg/dL (ref 0.44–1.00)

## 2015-07-19 MED ORDER — IOHEXOL 300 MG/ML  SOLN
75.0000 mL | Freq: Once | INTRAMUSCULAR | Status: AC | PRN
  Administered 2015-07-19: 75 mL via INTRAVENOUS

## 2015-07-21 ENCOUNTER — Ambulatory Visit (INDEPENDENT_AMBULATORY_CARE_PROVIDER_SITE_OTHER): Payer: Medicare Other | Admitting: *Deleted

## 2015-07-21 DIAGNOSIS — J449 Chronic obstructive pulmonary disease, unspecified: Secondary | ICD-10-CM

## 2015-07-21 DIAGNOSIS — R0600 Dyspnea, unspecified: Secondary | ICD-10-CM

## 2015-07-21 LAB — PULMONARY FUNCTION TEST
DL/VA % PRED: 90 %
DL/VA: 4.57 ml/min/mmHg/L
DLCO UNC % PRED: 68 %
DLCO UNC: 18.53 ml/min/mmHg
FEF 25-75 Post: 2.12 L/sec
FEF 25-75 Pre: 1.13 L/sec
FEF2575-%Change-Post: 87 %
FEF2575-%Pred-Post: 107 %
FEF2575-%Pred-Pre: 57 %
FEV1-%CHANGE-POST: 40 %
FEV1-%Pred-Post: 71 %
FEV1-%Pred-Pre: 51 %
FEV1-Post: 1.75 L
FEV1-Pre: 1.24 L
FEV1FVC-%Change-Post: 40 %
FEV1FVC-%Pred-Pre: 80 %
FEV6-%Change-Post: 3 %
FEV6-%PRED-PRE: 63 %
FEV6-%Pred-Post: 66 %
FEV6-POST: 2.03 L
FEV6-Pre: 1.96 L
FEV6FVC-%PRED-POST: 104 %
FEV6FVC-%Pred-Pre: 104 %
FVC-%Change-Post: 0 %
FVC-%PRED-POST: 63 %
FVC-%PRED-PRE: 63 %
FVC-POST: 2.03 L
FVC-PRE: 2.03 L
POST FEV6/FVC RATIO: 100 %
PRE FEV6/FVC RATIO: 100 %
Post FEV1/FVC ratio: 86 %
Pre FEV1/FVC ratio: 61 %

## 2015-07-21 NOTE — Progress Notes (Signed)
SMW performed today. 

## 2015-07-21 NOTE — Progress Notes (Signed)
PFT performed today with Nitrogen washout due to pt being claustrophobic.

## 2015-07-22 ENCOUNTER — Telehealth: Payer: Self-pay | Admitting: *Deleted

## 2015-07-22 ENCOUNTER — Other Ambulatory Visit: Payer: Self-pay | Admitting: Family Medicine

## 2015-07-22 ENCOUNTER — Ambulatory Visit (INDEPENDENT_AMBULATORY_CARE_PROVIDER_SITE_OTHER): Payer: Medicare Other | Admitting: Internal Medicine

## 2015-07-22 ENCOUNTER — Encounter: Payer: Self-pay | Admitting: Internal Medicine

## 2015-07-22 VITALS — BP 132/74 | HR 67 | Ht 66.0 in | Wt 189.4 lb

## 2015-07-22 DIAGNOSIS — N6310 Unspecified lump in the right breast, unspecified quadrant: Secondary | ICD-10-CM

## 2015-07-22 DIAGNOSIS — J449 Chronic obstructive pulmonary disease, unspecified: Secondary | ICD-10-CM

## 2015-07-22 MED ORDER — ALBUTEROL SULFATE HFA 108 (90 BASE) MCG/ACT IN AERS: 2.0000 | INHALATION_SPRAY | Freq: Two times a day (BID) | RESPIRATORY_TRACT | Status: DC

## 2015-07-22 NOTE — Progress Notes (Signed)
[facility] Consultation      Date: 07/22/2015,   MRN# [id number] RHYANNE KRINGS [birth year] Code Status:  Code Status History    Date Active Date Inactive Code Status Order ID Comments User Context   07/24/2014 12:10 AM 07/24/2014  7:45 PM Full Code [id number]  Manus Gunning, MD Inpatient   10/12/2013  8:32 PM 10/15/2013  7:36 PM Full Code [id number]  John Giovanni, PA-C Inpatient   10/09/2013  7:30 PM 10/12/2013  8:32 PM Full Code [id number]  [name], MD Inpatient   10/08/2013 11:12 PM 10/09/2013  7:30 PM Full Code [id number]  Stephani Police, MD ED     Hosp day:@LENGTHOFSTAYDAYS @ Referring MD: @ATDPROV @     PCP:      AdmissionWeight: 189 lb 6.4 oz (85.911 kg)                 CurrentWeight: 189 lb 6.4 oz (85.911 kg) [name] is a 70 y.o. old female seen in consultation for SOB and cough.     CHIEF COMPLAINT:   Cough and wheezing   HISTORY OF PRESENT ILLNESS  70 yo white female with long standing smoking history apporx 30 pack year Sob seems to be at baseline, patient REFUSES TO take inhaled steroids at this time, only wants to use alb as needed She has DOE that she states is at baseline  PFT reviewed with patient Ratio 61% FEv1 51% DLCO 68%  Ct chest also reviewed with patient-small left Pulm nodule unchanged since 2015 Incidental finding of RT breast nodule  She uses albuterol as needed No signs of infection at this time-no fevers,chills No lower ext swelling  Has h/o OSA and noncomplaint due to claustrophobia Takes allergy shots given by ENT  Quit tobacco in 2015 when she had her CABG worked in [employer] educational needs teacher for 10 years     Current Medication:  Current outpatient prescriptions:  .  albuterol (PROVENTIL HFA;VENTOLIN HFA) 108 (90 BASE) MCG/ACT inhaler, Inhale 2 puffs into the lungs every 6 (six) hours as needed for wheezing or shortness of breath., Disp: 18 g, Rfl: 1 .  aspirin EC 81 MG tablet, Take 81 mg by mouth daily., Disp: , Rfl:  .   Calcium-Vitamin D (CALTRATE 600 PLUS-VIT D PO), Take 2 tablets by mouth 2 (two) times daily. , Disp: , Rfl:  .  citalopram (CELEXA) 20 MG tablet, TAKE ONE TABLET BY MOUTH ONCE DAILY, Disp: 30 tablet, Rfl: 5 .  Insulin Lispro Prot & Lispro (HUMALOG MIX 75/25 KWIKPEN) (75-25) 100 UNIT/ML Kwikpen, Inject 60 Units into the skin 2 (two) times daily., Disp: , Rfl:  .  lansoprazole (PREVACID) 30 MG capsule, Take 1 capsule (30 mg total) by mouth 2 (two) times daily before a meal., Disp: 180 capsule, Rfl: 3 .  levothyroxine (SYNTHROID, LEVOTHROID) 150 MCG tablet, Take 150 mcg by mouth daily before breakfast., Disp: , Rfl:  .  ONETOUCH DELICA LANCETS 99991111 MISC, USE TO CHECK BLOOD SUGAR THREE TIMES DAILY, Disp: , Rfl:  .  promethazine (PHENERGAN) 12.5 MG tablet, TAKE ONE TO TWO TABLETS BY MOUTH EVERY 6 HOURS AS NEEDED FOR NAUSEA AND VOMITING, Disp: 28 tablet, Rfl: 0 .  traZODone (DESYREL) 100 MG tablet, TAKE TWO TABLETS BY MOUTH AT BEDTIME, Disp: 60 tablet, Rfl: 0    ALLERGIES   Amitriptyline; Benadryl; Demerol; Gabapentin; Loratadine; Meperidine hcl; Mirtazapine; Olanzapine; Voltaren; Zetia; Ativan; Atorvastatin; Budesonide-formoterol fumarate; Bupropion hcl; Caffeine; Codeine sulfate; Lisinopril; Metformin; Mometasone furoate; Morphine sulfate; Other; Oxycodone-acetaminophen;  Pioglitazone; Propoxyphene n-acetaminophen; Rosuvastatin; Shellfish allergy; Tramadol; Venlafaxine; Zolpidem tartrate; and Latex     REVIEW OF SYSTEMS   Review of Systems  Constitutional: Negative for fever, chills, weight loss and malaise/fatigue.  HENT: Negative for congestion and sore throat.   Respiratory: Positive for shortness of breath and wheezing. Negative for cough, hemoptysis and sputum production.   Cardiovascular: Negative for leg swelling.  Musculoskeletal: Negative.   Skin: Negative for rash.  Neurological: Negative for headaches.  Psychiatric/Behavioral: The patient is nervous/anxious.   All other systems  reviewed and are negative.    VS: BP 132/74 mmHg  Pulse 67  Ht 5\' 6"  (1.676 m)  Wt 189 lb 6.4 oz (85.911 kg)  BMI 30.58 kg/m2  SpO2 97%     PHYSICAL EXAM  Physical Exam  Constitutional: She is oriented to person, place, and time. She appears well-developed and well-nourished. No distress.  HENT:  Mouth/Throat: No oropharyngeal exudate.  Eyes: EOM are normal. Pupils are equal, round, and reactive to light.  Cardiovascular: Normal rate, regular rhythm and normal heart sounds.   No murmur heard. Pulmonary/Chest: No stridor. No respiratory distress. She has no wheezes. She has no rales.  Musculoskeletal: Normal range of motion. She exhibits no edema.  Neurological: She is alert and oriented to person, place, and time.  Skin: Skin is warm. She is not diaphoretic.  Psychiatric: She has a normal mood and affect.           IMAGING    Ct Chest W Contrast  07/19/2015  CLINICAL DATA:  Cough and shortness of breath worsening over 2 months. EXAM: CT CHEST WITH CONTRAST TECHNIQUE: Multidetector CT imaging of the chest was performed during intravenous contrast administration. CONTRAST:  41mL OMNIPAQUE IOHEXOL 300 MG/ML  SOLN COMPARISON:  03/02/2014.  11/14/2012. FINDINGS: Mediastinum / Lymph Nodes: There is no axillary lymphadenopathy. No mediastinal lymphadenopathy. There is no hilar lymphadenopathy. The heart size is normal. No pericardial effusion. Patient is status post CABG. Lungs / Pleura: No focal airspace consolidation. No pulmonary edema or pleural effusion. 3 mm pulmonary nodule is seen along the left major fissure and is unchanged in the interval, compatible with subpleural lymph node. 2 mm nodule in the posterior left lung base (image 40 series 3) is stable since 03/02/2014 and this area of the left lower lobe was obscured by atelectasis on the 2014 exam. Upper Abdomen:  Unremarkable. MSK / Soft Tissues: 12 mm soft tissue nodule identified in the central right breast. Bone windows  reveal no worrisome lytic or sclerotic osseous lesions. IMPRESSION: Stable exam. No CT findings to explain the patient's history of worsening shortness of breath. 12 mm right breast nodule. Correlation with mammographic screening recommended. Electronically Signed   By: Misty Stanley M.D.   On: 07/19/2015 09:33     CXR on 2/14 images reveiwed 07/22/2015   ASSESSMENT/PLAN   70 yo white female with long standing smoking history with findings suggestive of Moderate -Severe COPD Gold Stage B  1.started Breo last visit-patient refuses to take inhaled steroids at this time 2.albuterol 2 puffs daily 3.Lung Nodule stable -follow up CT chest in 1 year 4.rt breast nodule-recommend follow up with Dr.Sankar  Follow up in 3 months  I have personally obtained a history, examined the patient, evaluated laboratory and independently reviewed imaging results, formulated the assessment and plan and placed orders.  The Patient requires high complexity decision making for assessment and support, frequent evaluation and titration of therapies, application of advanced monitoring technologies and extensive  interpretation of multiple databases. Patient/Family are satisfied with Plan of action and management. All questions answered  Corrin Parker, M.D.  [facility]  Medical Director [place] Director [facility] Cardio-Pulmonary Department

## 2015-07-22 NOTE — Patient Instructions (Signed)
Chronic Obstructive Pulmonary Disease Chronic obstructive pulmonary disease (COPD) is a common lung condition in which airflow from the lungs is limited. COPD is a general term that can be used to describe many different lung problems that limit airflow, including both chronic bronchitis and emphysema. If you have COPD, your lung function will probably never return to normal, but there are measures you can take to improve lung function and make yourself feel better. CAUSES   Smoking (common).  Exposure to secondhand smoke.  Genetic problems.  Chronic inflammatory lung diseases or recurrent infections. SYMPTOMS  Shortness of breath, especially with physical activity.  Deep, persistent (chronic) cough with a large amount of thick mucus.  Wheezing.  Rapid breaths (tachypnea).  Gray or bluish discoloration (cyanosis) of the skin, especially in your fingers, toes, or lips.  Fatigue.  Weight loss.  Frequent infections or episodes when breathing symptoms become much worse (exacerbations).  Chest tightness. DIAGNOSIS Your health care provider will take a medical history and perform a physical examination to diagnose COPD. Additional tests for COPD may include:  Lung (pulmonary) function tests.  Chest X-ray.  CT scan.  Blood tests. TREATMENT  Treatment for COPD may include:  Inhaler and nebulizer medicines. These help manage the symptoms of COPD and make your breathing more comfortable.  Supplemental oxygen. Supplemental oxygen is only helpful if you have a low oxygen level in your blood.  Exercise and physical activity. These are beneficial for nearly all people with COPD.  Lung surgery or transplant.  Nutrition therapy to gain weight, if you are underweight.  Pulmonary rehabilitation. This may involve working with a team of health care providers and specialists, such as respiratory, occupational, and physical therapists. HOME CARE INSTRUCTIONS  Take all medicines  (inhaled or pills) as directed by your health care provider.  Avoid over-the-counter medicines or cough syrups that dry up your airway (such as antihistamines) and slow down the elimination of secretions unless instructed otherwise by your health care provider.  If you are a smoker, the most important thing that you can do is stop smoking. Continuing to smoke will cause further lung damage and breathing trouble. Ask your health care provider for help with quitting smoking. He or she can direct you to community resources or hospitals that provide support.  Avoid exposure to irritants such as smoke, chemicals, and fumes that aggravate your breathing.  Use oxygen therapy and pulmonary rehabilitation if directed by your health care provider. If you require home oxygen therapy, ask your health care provider whether you should purchase a pulse oximeter to measure your oxygen level at home.  Avoid contact with individuals who have a contagious illness.  Avoid extreme temperature and humidity changes.  Eat healthy foods. Eating smaller, more frequent meals and resting before meals may help you maintain your strength.  Stay active, but balance activity with periods of rest. Exercise and physical activity will help you maintain your ability to do things you want to do.  Preventing infection and hospitalization is very important when you have COPD. Make sure to receive all the vaccines your health care provider recommends, especially the pneumococcal and influenza vaccines. Ask your health care provider whether you need a pneumonia vaccine.  Learn and use relaxation techniques to manage stress.  Learn and use controlled breathing techniques as directed by your health care provider. Controlled breathing techniques include:  Pursed lip breathing. Start by breathing in (inhaling) through your nose for 1 second. Then, purse your lips as if you were   going to whistle and breathe out (exhale) through the  pursed lips for 2 seconds.  Diaphragmatic breathing. Start by putting one hand on your abdomen just above your waist. Inhale slowly through your nose. The hand on your abdomen should move out. Then purse your lips and exhale slowly. You should be able to feel the hand on your abdomen moving in as you exhale.  Learn and use controlled coughing to clear mucus from your lungs. Controlled coughing is a series of short, progressive coughs. The steps of controlled coughing are: 1. Lean your head slightly forward. 2. Breathe in deeply using diaphragmatic breathing. 3. Try to hold your breath for 3 seconds. 4. Keep your mouth slightly open while coughing twice. 5. Spit any mucus out into a tissue. 6. Rest and repeat the steps once or twice as needed. SEEK MEDICAL CARE IF:  You are coughing up more mucus than usual.  There is a change in the color or thickness of your mucus.  Your breathing is more labored than usual.  Your breathing is faster than usual. SEEK IMMEDIATE MEDICAL CARE IF:  You have shortness of breath while you are resting.  You have shortness of breath that prevents you from:  Being able to talk.  Performing your usual physical activities.  You have chest pain lasting longer than 5 minutes.  Your skin color is more cyanotic than usual.  You measure low oxygen saturations for longer than 5 minutes with a pulse oximeter. MAKE SURE YOU:  Understand these instructions.  Will watch your condition.  Will get help right away if you are not doing well or get worse.   This information is not intended to replace advice given to you by your health care provider. Make sure you discuss any questions you have with your health care provider.   Document Released: 02/28/2005 Document Revised: 06/11/2014 Document Reviewed: 01/15/2013 Elsevier Interactive Patient Education 2016 Elsevier Inc.  

## 2015-07-22 NOTE — Telephone Encounter (Signed)
Patient called wanting to know if we can order her a mammogram at [facility]. She had a Chest CT scan done on 07/19/15, Dr.Kasa informed patient that there was a RT Breast Nodule that showed up on the CT. Patient is requesting morning appointments for both mammogram and office.

## 2015-07-25 ENCOUNTER — Telehealth: Payer: Self-pay

## 2015-07-25 NOTE — Telephone Encounter (Signed)
Pt husband called, states insurance is denying inahler for 50 days, pt would like a 30 written rx, and he will come by and pick it up.

## 2015-07-25 NOTE — Telephone Encounter (Signed)
Spoke with Husband and he states [facility] told him rx for Albuterol was sent in a a 50 days supply. Called [facility] and they state it was trying to be picked up too soon. Called husband back and let him know that they state per insurance, next refill can be picked up on 08/11/15. Nothing further needed.

## 2015-07-26 ENCOUNTER — Telehealth: Payer: Self-pay | Admitting: Family Medicine

## 2015-07-26 DIAGNOSIS — N6310 Unspecified lump in the right breast, unspecified quadrant: Secondary | ICD-10-CM

## 2015-07-26 NOTE — Telephone Encounter (Signed)
Dr Deborra Medina i need a left uni left ultra sound order in system before [place] will make appointment

## 2015-07-26 NOTE — Telephone Encounter (Signed)
Ok I will place order now.  Thanks.

## 2015-07-26 NOTE — Telephone Encounter (Signed)
Appointment 3/6 Pt aware

## 2015-07-29 DIAGNOSIS — J449 Chronic obstructive pulmonary disease, unspecified: Secondary | ICD-10-CM | POA: Diagnosis not present

## 2015-07-29 DIAGNOSIS — F3341 Major depressive disorder, recurrent, in partial remission: Secondary | ICD-10-CM | POA: Diagnosis not present

## 2015-07-29 DIAGNOSIS — K219 Gastro-esophageal reflux disease without esophagitis: Secondary | ICD-10-CM | POA: Diagnosis not present

## 2015-08-08 ENCOUNTER — Ambulatory Visit
Admission: RE | Admit: 2015-08-08 | Discharge: 2015-08-08 | Disposition: A | Discharge status: Home / Self Care | Payer: Medicare Other | Source: Ambulatory Visit | Attending: Family Medicine | Admitting: Family Medicine

## 2015-08-08 ENCOUNTER — Other Ambulatory Visit: Payer: Self-pay | Admitting: Family Medicine

## 2015-08-08 DIAGNOSIS — N6310 Unspecified lump in the right breast, unspecified quadrant: Secondary | ICD-10-CM

## 2015-08-08 DIAGNOSIS — N63 Unspecified lump in breast: Secondary | ICD-10-CM | POA: Diagnosis present

## 2015-08-08 DIAGNOSIS — N6001 Solitary cyst of right breast: Secondary | ICD-10-CM | POA: Insufficient documentation

## 2015-08-08 DIAGNOSIS — N6011 Diffuse cystic mastopathy of right breast: Secondary | ICD-10-CM | POA: Diagnosis not present

## 2015-08-15 ENCOUNTER — Other Ambulatory Visit: Payer: Self-pay | Admitting: Family Medicine

## 2015-08-22 DIAGNOSIS — K219 Gastro-esophageal reflux disease without esophagitis: Secondary | ICD-10-CM | POA: Diagnosis not present

## 2015-08-22 DIAGNOSIS — F3341 Major depressive disorder, recurrent, in partial remission: Secondary | ICD-10-CM | POA: Diagnosis not present

## 2015-08-22 DIAGNOSIS — E781 Pure hyperglyceridemia: Secondary | ICD-10-CM | POA: Diagnosis not present

## 2015-08-22 DIAGNOSIS — J449 Chronic obstructive pulmonary disease, unspecified: Secondary | ICD-10-CM | POA: Diagnosis not present

## 2015-08-24 ENCOUNTER — Telehealth: Payer: Self-pay

## 2015-08-24 NOTE — Telephone Encounter (Signed)
Spoke to pts husband and advise paperwork has not been received. States he will have it faxed over for completion of PA

## 2015-08-24 NOTE — Telephone Encounter (Signed)
Mr Luker (DPR signed) spoke with [place] and prior Josem Kaufmann is needed for lansoprazole 90 day supply; pharmacy has faxed PA request. Mr Ealy request cb.

## 2015-09-09 DIAGNOSIS — J301 Allergic rhinitis due to pollen: Secondary | ICD-10-CM | POA: Diagnosis not present

## 2015-09-12 DIAGNOSIS — J301 Allergic rhinitis due to pollen: Secondary | ICD-10-CM | POA: Diagnosis not present

## 2015-10-04 ENCOUNTER — Encounter: Payer: Self-pay | Admitting: *Deleted

## 2015-10-07 ENCOUNTER — Ambulatory Visit (INDEPENDENT_AMBULATORY_CARE_PROVIDER_SITE_OTHER): Payer: Medicare Other | Admitting: Cardiovascular Disease

## 2015-10-07 VITALS — BP 138/76 | HR 68 | Ht 66.0 in | Wt 189.1 lb

## 2015-10-07 DIAGNOSIS — I359 Nonrheumatic aortic valve disorder, unspecified: Secondary | ICD-10-CM

## 2015-10-07 DIAGNOSIS — I10 Essential (primary) hypertension: Secondary | ICD-10-CM

## 2015-10-07 DIAGNOSIS — I2510 Atherosclerotic heart disease of native coronary artery without angina pectoris: Secondary | ICD-10-CM | POA: Diagnosis not present

## 2015-10-07 DIAGNOSIS — E785 Hyperlipidemia, unspecified: Secondary | ICD-10-CM

## 2015-10-07 NOTE — Patient Instructions (Signed)
Medication Instructions:  Your physician recommends that you continue on your current medications as directed. Please refer to the Current Medication list given to you today.   Labwork: none  Testing/Procedures: You have been referred to the Lipid clinic to discuss new cholesterol medications.  Please schedule patient for appt.    Follow-Up: Your physician wants you to follow-up in: 6 months.  You will receive a reminder letter in the mail two months in advance. If you don't receive a letter, please call our office to schedule the follow-up appointment.   Any Other Special Instructions Will Be Listed Below (If Applicable).     If you need a refill on your cardiac medications before your next appointment, please call your pharmacy.

## 2015-10-07 NOTE — Progress Notes (Signed)
Chief Complaint  Patient presents with  . Follow-up    pt c/o chest pain for 1 week  . Coronary Artery Disease  . Chest Pain     History of Present Illness: 70 yo female with history of anemia, HTN, HLD, DM, COPD, CAD s/p prior stent placements followed by CABG and AVR May 2015 who is here today for cardiac follow up. She had a DES placed in the RCA and mid Circumflex in May 2009. She was admitted 10/2013 at [facility] with chest pain. Echo demonstrated normal LVF, mod AI and mild AS. Myoview was negative for ischemia. She followed up with Dr. Ida Rogue in [place] with continued symptoms and was set up for [facility] This demonstrated 3v CAD with severe prox LAD stenosis and ostial RCA. She was referred for CABG. Intraoperative TEE demonstrated mild AS and mod to severe AI. She underwent CABG with Dr. Cyndia Bent 10/12/13 (L-LAD, S-RCA) + AVR with a bioprosthetic valve (21 mm [employer]). Echo June 2015 with normal LVEF and normal AVR. She was seen in our office 12/03/13 by Richardson Dopp, PA-C and still having chest pain. Troponin was negative. D-dimer was elevated. Pt was sent to [facility] for CTA chest to exclude PE that evening. NO PE on chest CT but noted to have small left pleural effusion. Admitted to [facility] 07/26/14 with chest pain. Stress myoview February 2016 with no ischemia. She does not tolerate statins or Zetia. Seen in lipid clinic Feb 2016 and tried on low dose Pravachol but did not tolerate.   She returns today for follow up. She denies SOB, PND, edema or syncope. Mid sharp, shooting chest wall pains at rest. No change with exertion.   Primary Care Physician: Glendon Axe, MD   Past Medical History  Diagnosis Date  . Allergic rhinitis   . Diverticulitis of colon   . GERD (gastroesophageal reflux disease)   . Chronic depression   . Hypercholesterolemia     intolerance of statins and niaspan  . OSA (obstructive sleep apnea)     mild, intolerant of cpap  . ACE-inhibitor cough   .  Cataract   . Chronic headache   . Hearing loss   . History of blood transfusion 2013  . Coronary artery disease     a. s/p CABG x2 (LIMA-LAD and SVG-RCA)  b. NST neg for ischemia 07/24/14  . Diabetes mellitus     type 2  . Anxiety   . PONV (postoperative nausea and vomiting)   . Thyroid disease   . Arthritis   . Anemia     iron deficiency anemia  . H/O aortic valve replacement   . COPD (chronic obstructive pulmonary disease) ([place])   . Asthma     Past Surgical History  Procedure Laterality Date  . Cholecystectomy  2001  . Colectomy      lap sigmoid  . Tonsillectomy    . Bartholin gland cyst excision    . Tubal ligation    . Bladder suspension    . Breast cyst excision  1988    bilateral nonmalignant tumors, x3  . Vaginal delivery      3  . Thyroidectomy    . Abdominal hysterectomy w/ partial vaginactomy    . Appendectomy  1964  . Colonoscopy  2014    polyps found, 2 clamped off.  . Cardiac catheterization    . Coronary angioplasty  10/29/2007    Prox RCA & Mid Cx.  . Coronary artery bypass graft N/A  10/12/2013    Procedure: CORONARY ARTERY BYPASS GRAFT TIMES TWO;  Surgeon: [name], MD;  Location: [place] OR;  Service: Open Heart Surgery;  Laterality: N/A;  . Aortic valve replacement N/A 10/12/2013    Procedure: AORTIC VALVE REPLACEMENT (AVR);  Surgeon: [name], MD;  Location: [place];  Service: Open Heart Surgery;  Laterality: N/A;  . Cataract extraction w/ intraocular lens  implant, bilateral    . Abdominal hysterectomy    . Sternal wires removal N/A 04/13/2014    Procedure: STERNAL WIRES REMOVAL;  Surgeon: [name], MD;  Location: [facility] OR;  Service: Thoracic;  Laterality: N/A;  . Left heart catheterization with coronary angiogram N/A 10/09/2013    Procedure: LEFT HEART CATHETERIZATION WITH CORONARY ANGIOGRAM;  Surgeon: [name], MD;  Location: [facility] CATH LAB;  Service: Cardiovascular;  Laterality: N/A;  . Breast biopsy Bilateral 09/11/2000    neg  .  Breast biopsy Left 07/24/2010    neg    Current Outpatient Prescriptions  Medication Sig Dispense Refill  . FLUoxetine (PROZAC) 40 MG capsule Take 40 mg by mouth 2 (two) times daily.    Marland Kitchen albuterol (PROVENTIL HFA;VENTOLIN HFA) 108 (90 Base) MCG/ACT inhaler Inhale 2 puffs into the lungs 2 (two) times daily at 10 AM and 5 PM. 18 g 10  . aspirin EC 81 MG tablet Take 81 mg by mouth daily.    . Calcium-Vitamin D (CALTRATE 600 PLUS-VIT D PO) Take 2 tablets by mouth 2 (two) times daily.     . citalopram (CELEXA) 20 MG tablet TAKE ONE TABLET BY MOUTH ONCE DAILY 30 tablet 5  . Insulin Lispro Prot & Lispro (HUMALOG MIX 75/25 KWIKPEN) (75-25) 100 UNIT/ML Kwikpen Inject 60 Units into the skin 2 (two) times daily.    Marland Kitchen levothyroxine (SYNTHROID, LEVOTHROID) 150 MCG tablet Take 150 mcg by mouth daily before breakfast.    . ONETOUCH DELICA LANCETS 99991111 MISC USE TO CHECK BLOOD SUGAR THREE TIMES DAILY    . traZODone (DESYREL) 100 MG tablet TAKE TWO TABLETS BY MOUTH AT BEDTIME 60 tablet 0   No current facility-administered medications for this visit.    Allergies  Allergen Reactions  . Amitriptyline Other (See Comments)    Unknown reaction  . Benadryl [Diphenhydramine] Shortness Of Breath  . Demerol [Meperidine] Other (See Comments)    Unknown reaction  . Gabapentin Other (See Comments)    Unknown reaction  . Loratadine Other (See Comments)    Unknown reaction  . Meperidine Hcl Other (See Comments)    Unknown reaction  . Mirtazapine Other (See Comments)    Unknown reaction  . Olanzapine Other (See Comments)    Unknown reaction   . Voltaren [Diclofenac Sodium] Shortness Of Breath  . Zetia [Ezetimibe] Other (See Comments)    Weakness in legs, shakiness all over  . Ativan [Lorazepam] Other (See Comments)    Causes double vision at highter than .5 mg dose  . Atorvastatin Other (See Comments)    Muscle aches and weakness  . Budesonide-Formoterol Fumarate Other (See Comments)    Shakiness, tremors    . Bupropion Hcl Other (See Comments)    "cloud over me" depression  . Caffeine Other (See Comments)    jitters  . Codeine Sulfate Other (See Comments)    Makes chest hurt like a heart attack  . Lisinopril Cough  . Metformin Nausea And Vomiting  . Mometasone Furoate Nausea And Vomiting  . Morphine Sulfate Other (See Comments)  Chest pain like a heart attack  . Other Other (See Comments)    Beta Blockers, reaction shortness of breath  . Oxycodone-Acetaminophen Nausea And Vomiting  . Pioglitazone Other (See Comments)    Cannot take because of risk of bladder cancer  . Propoxyphene N-Acetaminophen Nausea And Vomiting  . Rosuvastatin Other (See Comments)    Muscle aches and weakness  . Shellfish Allergy Diarrhea  . Tramadol Nausea Only  . Venlafaxine Other (See Comments)    Unknown reaction  . Zolpidem Tartrate Other (See Comments)     Jittery, diarrhea  . Latex Rash    Social History   Social History  . Marital Status: Married    Spouse Name: N/A  . Number of Children: 3  . Years of Education: N/A   Occupational History  . Retired     Quarry manager   Social History Main Topics  . Smoking status: Former Smoker -- 0.50 packs/day for 30 years    Types: Cigarettes    Quit date: 10/02/2013  . Smokeless tobacco: Never Used  . Alcohol Use: No  . Drug Use: No  . Sexual Activity: Not Currently   Other Topics Concern  . Not on file   Social History Narrative   Does not have Living Will   Desires CPR, would not want prolonged life support if futile.    Family History  Problem Relation Age of Onset  . Breast cancer Mother 58  . Hypertension Father   . Mesothelioma Father   . Asthma Father   . Stroke Paternal Grandfather   . Heart disease    . Breast cancer Maternal Aunt   . Breast cancer Paternal Aunt     Review of Systems:  As stated in the HPI and otherwise negative.   BP 138/76 mmHg  Pulse 68  Ht 5\' 6"  (1.676 m)  Wt 189 lb 1.9 oz (85.784 kg)  BMI 30.54 kg/m2   SpO2 97%  Physical Examination: General: Well developed, well nourished, NAD HEENT: OP clear, mucus membranes moist SKIN: warm, dry. No rashes. Neuro: No focal deficits Musculoskeletal: Muscle strength 5/5 all ext Psychiatric: Mood and affect normal Neck: No JVD, no carotid bruits, no thyromegaly, no lymphadenopathy. Lungs:Clear bilaterally, no wheezes, rhonci, crackles Cardiovascular: Regular rate and rhythm. Systolic murmurs, gallops or rubs. Abdomen:Soft. Bowel sounds present. Non-tender.  Extremities: No lower extremity edema. Pulses are 2 + in the bilateral DP/PT.  EKG:  EKG is ordered today. The ekg ordered today demonstrates NSR, rate 68 bpm. 1st degree AV block. PVC. Non-specific ST and T wave abn.   Recent Labs: 06/08/2015: BUN 21*; Potassium 4.3; Sodium 137 06/22/2015: Hemoglobin 12.1; Platelets 191 07/19/2015: Creatinine, Ser 0.90   Lipid Panel    Component Value Date/Time   CHOL 206* 03/16/2015 0759   CHOL 211* 10/03/2013 0515   TRIG 369* 03/16/2015 0759   TRIG 272* 10/03/2013 0515   HDL 21* 03/16/2015 0759   HDL 22* 10/03/2013 0515   CHOLHDL 9.8* 03/16/2015 0759   VLDL 74* 03/16/2015 0759   VLDL 54* 10/03/2013 0515   LDLCALC 111 03/16/2015 0759   LDLCALC 135* 10/03/2013 0515   LDLDIRECT 127.0 07/07/2014 0820     Wt Readings from Last 3 Encounters:  10/07/15 189 lb 1.9 oz (85.784 kg)  07/22/15 189 lb 6.4 oz (85.911 kg)  06/22/15 189 lb 9.6 oz (86.002 kg)     Other studies Reviewed: Additional studies/ records that were reviewed today include: . Review of the above records demonstrates:  Assessment and Plan:   1. CAD: s/p CABG. She is overall stable with chronic chest wall pain. Will continue ASA. She does not tolerate statins or beta blockers.   2. Aortic valve disease: s/p AVR. Stable. Continue SBE prophylaxis.   3. Hypertension: Controlled. No changes.   4. Hyperlipidemia: She is statin intolerant (Crestor, Lipitor, Pravastatin). She did not  tolerate Zetia.  She was seen in lipid clinic 07/13/14 and tried on low dose Pravastatin but did not tolerate due to muscle aches. She is now willing to try a PCSK9 inhibitor. LDL 111 October 2016.   Current medicines are reviewed at length with the patient today.  The patient does not have concerns regarding medicines.  The following changes have been made:  no change  Labs/ tests ordered today include:   Orders Placed This Encounter  Procedures  . EKG 12-Lead    Disposition:   FU with me in 6 months  Signed, [name], MD 10/07/2015 1:56 PM    [facility] [place], [place], [place]  [address] Phone: [phone]; Fax: [phone]

## 2015-10-10 ENCOUNTER — Ambulatory Visit: Payer: Medicare Other | Admitting: Pharmacist

## 2015-10-12 ENCOUNTER — Other Ambulatory Visit: Payer: Medicare Other

## 2015-10-12 ENCOUNTER — Ambulatory Visit: Payer: Medicare Other | Admitting: Internal Medicine

## 2015-11-08 DIAGNOSIS — Z8709 Personal history of other diseases of the respiratory system: Secondary | ICD-10-CM | POA: Diagnosis not present

## 2015-11-08 DIAGNOSIS — J208 Acute bronchitis due to other specified organisms: Secondary | ICD-10-CM | POA: Diagnosis not present

## 2015-11-08 DIAGNOSIS — Z8639 Personal history of other endocrine, nutritional and metabolic disease: Secondary | ICD-10-CM | POA: Diagnosis not present

## 2015-11-14 ENCOUNTER — Encounter: Payer: Self-pay | Admitting: Emergency Medicine

## 2015-11-14 ENCOUNTER — Ambulatory Visit (INDEPENDENT_AMBULATORY_CARE_PROVIDER_SITE_OTHER): Payer: Medicare Other | Admitting: Family Medicine

## 2015-11-14 ENCOUNTER — Emergency Department
Admission: EM | Admit: 2015-11-14 | Discharge: 2015-11-14 | Disposition: A | Discharge status: Home / Self Care | Payer: Medicare Other | Attending: Emergency Medicine | Admitting: Emergency Medicine

## 2015-11-14 ENCOUNTER — Emergency Department: Payer: Medicare Other

## 2015-11-14 ENCOUNTER — Encounter: Payer: Self-pay | Admitting: Family Medicine

## 2015-11-14 VITALS — BP 140/80 | HR 73 | Temp 99.3°F | Ht 66.0 in | Wt 188.5 lb

## 2015-11-14 DIAGNOSIS — M7501 Adhesive capsulitis of right shoulder: Secondary | ICD-10-CM | POA: Diagnosis not present

## 2015-11-14 DIAGNOSIS — Z7982 Long term (current) use of aspirin: Secondary | ICD-10-CM | POA: Insufficient documentation

## 2015-11-14 DIAGNOSIS — Z79899 Other long term (current) drug therapy: Secondary | ICD-10-CM | POA: Diagnosis not present

## 2015-11-14 DIAGNOSIS — I10 Essential (primary) hypertension: Secondary | ICD-10-CM | POA: Insufficient documentation

## 2015-11-14 DIAGNOSIS — E119 Type 2 diabetes mellitus without complications: Secondary | ICD-10-CM | POA: Insufficient documentation

## 2015-11-14 DIAGNOSIS — I2510 Atherosclerotic heart disease of native coronary artery without angina pectoris: Secondary | ICD-10-CM | POA: Diagnosis not present

## 2015-11-14 DIAGNOSIS — Z91040 Latex allergy status: Secondary | ICD-10-CM | POA: Diagnosis not present

## 2015-11-14 DIAGNOSIS — Z955 Presence of coronary angioplasty implant and graft: Secondary | ICD-10-CM | POA: Diagnosis not present

## 2015-11-14 DIAGNOSIS — J45909 Unspecified asthma, uncomplicated: Secondary | ICD-10-CM | POA: Diagnosis not present

## 2015-11-14 DIAGNOSIS — J449 Chronic obstructive pulmonary disease, unspecified: Secondary | ICD-10-CM | POA: Insufficient documentation

## 2015-11-14 DIAGNOSIS — E039 Hypothyroidism, unspecified: Secondary | ICD-10-CM | POA: Diagnosis not present

## 2015-11-14 DIAGNOSIS — F329 Major depressive disorder, single episode, unspecified: Secondary | ICD-10-CM | POA: Insufficient documentation

## 2015-11-14 DIAGNOSIS — Z87891 Personal history of nicotine dependence: Secondary | ICD-10-CM | POA: Diagnosis not present

## 2015-11-14 DIAGNOSIS — S4991XA Unspecified injury of right shoulder and upper arm, initial encounter: Secondary | ICD-10-CM | POA: Diagnosis not present

## 2015-11-14 DIAGNOSIS — M1990 Unspecified osteoarthritis, unspecified site: Secondary | ICD-10-CM | POA: Diagnosis not present

## 2015-11-14 DIAGNOSIS — M25511 Pain in right shoulder: Secondary | ICD-10-CM | POA: Diagnosis not present

## 2015-11-14 DIAGNOSIS — Z954 Presence of other heart-valve replacement: Secondary | ICD-10-CM | POA: Insufficient documentation

## 2015-11-14 MED ORDER — CAPSAICIN-LIDOCAINE-MENTHOL 0.05-5-3 % EX CREA: 1.0000 "application " | TOPICAL_CREAM | Freq: Two times a day (BID) | CUTANEOUS | Status: DC

## 2015-11-14 MED ORDER — METHYLPREDNISOLONE ACETATE 40 MG/ML IJ SUSP
80.0000 mg | Freq: Once | INTRAMUSCULAR | Status: AC
  Administered 2015-11-14: 80 mg via INTRA_ARTICULAR

## 2015-11-14 NOTE — ED Provider Notes (Signed)
[facility] Emergency Department Provider Note    ____________________________________________  Time seen: ~0325  I have reviewed the triage vital signs and the nursing notes.   HISTORY  Chief Complaint Shoulder Pain   History limited by: Not Limited   HPI [name] is a 70 y.o. female who presents to the emergency department today because of concerns for right shoulder pain. Patient says she has had problems with her right shoulder for the past 2 years. 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 Her pain will come and go. Tonight she rolled over on been on it. This made the pain becomes severe. She does not think she twisted the shoulder in any way. She is not having any numbness or arm. Denies any other injuries.    Past Medical History  Diagnosis Date  . Allergic rhinitis   . Diverticulitis of colon   . GERD (gastroesophageal reflux disease)   . Chronic depression   . Hypercholesterolemia     intolerance of statins and niaspan  . OSA (obstructive sleep apnea)     mild, intolerant of cpap  . ACE-inhibitor cough   . Cataract   . Chronic headache   . Hearing loss   . History of blood transfusion 2013  . Coronary artery disease     a. s/p CABG x2 (LIMA-LAD and SVG-RCA)  b. NST neg for ischemia 07/24/14  . Diabetes mellitus     type 2  . Anxiety   . PONV (postoperative nausea and vomiting)   . Thyroid disease   . Arthritis   . Anemia     iron deficiency anemia  . H/O aortic valve replacement   . COPD (chronic obstructive pulmonary disease) ([place])   . Asthma     Patient Active Problem List   Diagnosis Date Noted  . Parotiditis 04/25/2015  . Viral URI 03/24/2015  . Hypoglycemia 08/25/2014  . Nausea without vomiting 08/25/2014  . Hypercholesterolemia   . OSA (obstructive sleep apnea)   . Diabetes mellitus ([place])   . H/O aortic valve replacement   . Chest pain 01/19/2014  . Aortic valve disorder 11/11/2013  . S/P AVR (aortic  valve replacement) 10/12/2013  . Coronary Artery Disease s/p CABG 10/2013 10/10/2013  . Other malaise and fatigue 09/11/2013  . Alopecia 03/06/2013  . Insomnia 02/06/2013  . GERD (gastroesophageal reflux disease) 01/19/2013  . Dysphagia, unspecified(787.20) 01/19/2013  . Dyspnea 11/17/2012  . Weakness 11/17/2012  . Abnormal MRI of head 09/05/2012  . Depression with anxiety 03/21/2012  . Extrinsic asthma 11/12/2011  . Anemia 06/07/2011  . PULMONARY NODULE, SOLITARY 01/25/2010  . Hypothyroidism 08/10/2009  . MICROSCOPIC HEMATURIA 05/07/2009  . GOITER 03/02/2009  . Hypertension 01/06/2009  . MEMORY LOSS 06/23/2008  . OBSTRUCTIVE SLEEP APNEA- C pap intol 12/25/2007  . Diabetes ([place]) 09/09/2006  . ALLERGIC RHINITIS 09/09/2006  . GERD 09/09/2006  . DIVERTICULOSIS, COLON 09/09/2006    Past Surgical History  Procedure Laterality Date  . Cholecystectomy  2001  . Colectomy      lap sigmoid  . Tonsillectomy    . Bartholin gland cyst excision    . Tubal ligation    . Bladder suspension    . Breast cyst excision  1988    bilateral nonmalignant tumors, x3  . Vaginal delivery      3  . Thyroidectomy    . Abdominal hysterectomy w/ partial vaginactomy    . Appendectomy  1964  . Colonoscopy  2014  polyps found, 2 clamped off.  . Cardiac catheterization    . Coronary angioplasty  10/29/2007    Prox RCA & Mid Cx.  . Coronary artery bypass graft N/A 10/12/2013    Procedure: CORONARY ARTERY BYPASS GRAFT TIMES TWO;  Surgeon: [name], MD;  Location: [place] OR;  Service: Open Heart Surgery;  Laterality: N/A;  . Aortic valve replacement N/A 10/12/2013    Procedure: AORTIC VALVE REPLACEMENT (AVR);  Surgeon: [name], MD;  Location: [place];  Service: Open Heart Surgery;  Laterality: N/A;  . Cataract extraction w/ intraocular lens  implant, bilateral    . Abdominal hysterectomy    . Sternal wires removal N/A 04/13/2014    Procedure: STERNAL WIRES REMOVAL;  Surgeon: [name], MD;   Location: [facility] OR;  Service: Thoracic;  Laterality: N/A;  . Left heart catheterization with coronary angiogram N/A 10/09/2013    Procedure: LEFT HEART CATHETERIZATION WITH CORONARY ANGIOGRAM;  Surgeon: [name], MD;  Location: [facility] C[facility] LAB;  Service: Cardiovascular;  Laterality: N/A;  . Breast biopsy Bilateral 09/11/2000    neg  . Breast biopsy Left 07/24/2010    neg    Current Outpatient Rx  Name  Route  Sig  Dispense  Refill  . albuterol (PROVENTIL HFA;VENTOLIN HFA) 108 (90 Base) MCG/ACT inhaler   Inhalation   Inhale 2 puffs into the lungs 2 (two) times daily at 10 AM and 5 PM.   18 g   10   . aspirin EC 81 MG tablet   Oral   Take 81 mg by mouth daily.         . Calcium-Vitamin D (CALTRATE 600 PLUS-VIT D PO)   Oral   Take 2 tablets by mouth 2 (two) times daily.          . citalopram (CELEXA) 20 MG tablet      TAKE ONE TABLET BY MOUTH ONCE DAILY   30 tablet   5   . FLUoxetine (PROZAC) 40 MG capsule   Oral   Take 40 mg by mouth 2 (two) times daily.         . Insulin Lispro Prot & Lispro (HUMALOG MIX 75/25 KWIKPEN) (75-25) 100 UNIT/ML Kwikpen   Subcutaneous   Inject 60 Units into the skin 2 (two) times daily.         Marland Kitchen levothyroxine (SYNTHROID, LEVOTHROID) 150 MCG tablet   Oral   Take 150 mcg by mouth daily before breakfast.         . ONETOUCH DELICA LANCETS 99991111 MISC      USE TO CHECK BLOOD SUGAR THREE TIMES DAILY         . traZODone (DESYREL) 100 MG tablet      TAKE TWO TABLETS BY MOUTH AT BEDTIME   60 tablet   0     Allergies Amitriptyline; Benadryl; Demerol; Gabapentin; Loratadine; Meperidine hcl; Mirtazapine; Olanzapine; Voltaren; Zetia; Ativan; Atorvastatin; Budesonide-formoterol fumarate; Bupropion hcl; Caffeine; Codeine sulfate; Lisinopril; Metformin; Mometasone furoate; Morphine sulfate; Other; Oxycodone-acetaminophen; Pioglitazone; Propoxyphene n-acetaminophen; Rosuvastatin; Shellfish allergy; Tramadol; Venlafaxine; Zolpidem tartrate;  and Latex  Family History  Problem Relation Age of Onset  . Breast cancer Mother 6  . Hypertension Father   . Mesothelioma Father   . Asthma Father   . Stroke Paternal Grandfather   . Heart disease    . Breast cancer Maternal Aunt   . Breast cancer Paternal Aunt     Social History Social History  Substance Use Topics  .  Smoking status: Former Smoker -- 0.50 packs/day for 30 years    Types: Cigarettes    Quit date: 10/02/2013  . Smokeless tobacco: Never Used  . Alcohol Use: No    Review of Systems  Constitutional: Negative for fever. Cardiovascular: Negative for chest pain. Respiratory: Negative for shortness of breath. Gastrointestinal: Negative for abdominal pain, vomiting and diarrhea. Musculoskeletal: Positive for right shoulder pain Neurological: Negative for headaches, focal weakness or numbness.   10-point ROS otherwise negative.  ____________________________________________   PHYSICAL EXAM:  VITAL SIGNS: ED Triage Vitals  Enc Vitals Group     BP 11/14/15 0140 176/83 mmHg     Pulse Rate 11/14/15 0140 77     Resp 11/14/15 0140 20     Temp 11/14/15 0140 98.3 F (36.8 C)     Temp Source 11/14/15 0140 Oral     SpO2 11/14/15 0140 96 %     Weight 11/14/15 0140 189 lb (85.73 kg)     Height 11/14/15 0140 5\' 6"  (1.676 m)     Head Cir --      Peak Flow --      Pain Score 11/14/15 0141 6   Constitutional: Alert and oriented. Well appearing and in no distress. Eyes: Conjunctivae are normal. PERRL. Normal extraocular movements. ENT   Head: Normocephalic and atraumatic.   Nose: No congestion/rhinnorhea.   Mouth/Throat: Mucous membranes are moist.   Neck: No stridor. Hematological/Lymphatic/Immunilogical: No cervical lymphadenopathy. Cardiovascular: Normal rate, regular rhythm.  No murmurs, rubs, or gallops. Respiratory: Normal respiratory effort without tachypnea nor retractions. Breath sounds are clear and equal bilaterally. No  wheezes/rales/rhonchi. Gastrointestinal: Soft and nontender. No distention.  Genitourinary: Deferred Musculoskeletal: Right shoulder without any [place], erythema or warmth. Patient is able to actively range the shoulder although she does have some limited range of motion. Radial pulse 2+. No deformity. Neurologic:  Normal speech and language. No gross focal neurologic deficits are appreciated.  Skin:  Skin is warm, dry and intact. No rash noted. Psychiatric: Mood and affect are normal. Speech and behavior are normal. Patient exhibits appropriate insight and judgment.  ____________________________________________    LABS (pertinent positives/negatives)  None  ____________________________________________   EKG  None  ____________________________________________    RADIOLOGY  Right shoulder pain IMPRESSION: No evidence of fracture or dislocation.  ____________________________________________   PROCEDURES  Procedure(s) performed: None  Critical Care performed: No  ____________________________________________   INITIAL IMPRESSION / ASSESSMENT AND PLAN / ED COURSE  Pertinent labs & imaging results that were available during my care of the patient were reviewed by me and considered in my medical decision making (see chart for details).  Patient presented to the emergency department today because of concerns for right shoulder pain after rolling on an embedded. X-rays do not show an acute fracture. On physical exam no concerning signs for a septic joint. This point think she might of aggravated injury from previous impression shoulder or rotator cuff injury. Patient does have a sports medicine doctor. I did encourage follow-up. Will try topical pain medication.  ____________________________________________   FINAL CLINICAL IMPRESSION(S) / ED DIAGNOSES  Final diagnoses:  Right shoulder pain     Note: This dictation was prepared with Dragon dictation. Any  transcriptional errors that result from this process are unintentional    Nance Pear, MD 11/14/15 [phone]

## 2015-11-14 NOTE — Progress Notes (Signed)
Pre visit review using our clinic review tool, if applicable. No additional management support is needed unless otherwise documented below in the visit note. 

## 2015-11-14 NOTE — ED Notes (Signed)
Patient states that she has had some pain in right shoulder for a couple of months but tonight she rolled over in bed and she started having shooting pain in her shoulder.

## 2015-11-14 NOTE — Progress Notes (Signed)
Dr. Frederico Hamman T. Elinora Weigand, MD, [facility] and [facility] Medicine [place], [address] Phone: [phone] Fax: [phone]  11/14/2015  Patient: [name], MRN: [id number], DOB: [birth year], 70 y.o.  Primary Physician:  Glendon Axe, MD  Chief Complaint: Shoulder Pain  Subjective:   NIMRAH BALAY is a 70 y.o. very pleasant female patient who presents with the following:  I have not seen the patient in 1 year:  ER visit this AM.  Turned over on shoulder and much pain this AM.  Thought it had been getting better.  Now, she actually has better movement in the shoulder than she did compared to when she had the pain this morning. She is accompanied by her husband who also gives history.  The shoulder has been bothering her for about 2 years. I have not seen her in one year. She never was able to go to formal physical therapy, and she was only mildly compliant with home rehabilitation.  R shoulder injection. Intraarticular shoulder  11/17/2014 Last OV with Owens Loffler, MD  Initial history is detailed below.  The patient is here in follow-up for primarily right greater than left shoulder pain, and initially she was having some impingement on the right side, and she was also having a.c. Joint arthropathy.  I did do a subacromial injection as well as a a.c. Joint injection which only provided modest temporary relief.  She has continued to have some pain, and she has been unable to do any formal physical therapy secondary to finances.  I'm unclear exactly how compliant she has been with doing her home program.  Her symptoms have transitioned over the last 2 months, and she and her husband tell me that she is been guarding her right shoulder quite a bit.  At this point now she has a dull ache on the lateral aspect of her shoulder and in a T-shirt distribution.  She has had loss of motion in all directions, and worse in the internal range of motion.  She  is very frustrated.  R 4th trigger finger trigger inj. She is also having some painful triggering on her fourth digit.  This is on the right.  Subac / AC problems ongoing.   Now, R frozen shoulder.     09/15/2014 Last OV with Owens Loffler, MD  F/u R > L shoulder.  Injection only lasted 3 days.  While last office visit, I did both an before meals joint injection and a subacromial injection. The patient had some great relief of symptoms, but unfortunately that lasted for only 3 days. She continues to have some pain. I recommended physical therapy on her last office visit and some home therapy, which he never did any of this.  She still has pretty significant pain complaints. Open-heart surgery noted recently.  08/04/2014 Last OV with Owens Loffler, MD  R > L shoulder pain.  R is the worst.    patient is very well-known, she has multiple musculoskeletal complaints including right greater than left shoulder pain. She points at the [facility] joint for the point of maximal tenderness.  She is having pain in abduction as well as internal rotation. She also has some pain at night in a T-shirt distribution.  Also the patient has some neck pain relatively chronically.  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    She had a complex open-heart surgery in May 2015, and she has had some difficulty since then.  At that time she had 2 bypasses as well as valve replacement.  Past Medical History, Surgical History, Social History, Family History, Problem List, Medications, and Allergies have been reviewed and updated if relevant.  Patient Active Problem List   Diagnosis Date Noted  . Parotiditis 04/25/2015  . Viral URI 03/24/2015  . Hypoglycemia 08/25/2014  . Nausea without vomiting 08/25/2014  . Hypercholesterolemia   . OSA (obstructive sleep apnea)   . Diabetes mellitus ([place])   . H/O aortic valve replacement   . Chest pain 01/19/2014  . Aortic valve disorder 11/11/2013  . S/P  AVR (aortic valve replacement) 10/12/2013  . Coronary Artery Disease s/p CABG 10/2013 10/10/2013  . Other malaise and fatigue 09/11/2013  . Alopecia 03/06/2013  . Insomnia 02/06/2013  . GERD (gastroesophageal reflux disease) 01/19/2013  . Dysphagia, unspecified(787.20) 01/19/2013  . Dyspnea 11/17/2012  . Weakness 11/17/2012  . Abnormal MRI of head 09/05/2012  . Depression with anxiety 03/21/2012  . Extrinsic asthma 11/12/2011  . Anemia 06/07/2011  . PULMONARY NODULE, SOLITARY 01/25/2010  . Hypothyroidism 08/10/2009  . MICROSCOPIC HEMATURIA 05/07/2009  . GOITER 03/02/2009  . Hypertension 01/06/2009  . MEMORY LOSS 06/23/2008  . OBSTRUCTIVE SLEEP APNEA- C pap intol 12/25/2007  . Diabetes ([place]) 09/09/2006  . ALLERGIC RHINITIS 09/09/2006  . GERD 09/09/2006  . DIVERTICULOSIS, COLON 09/09/2006    Past Medical History  Diagnosis Date  . Allergic rhinitis   . Diverticulitis of colon   . GERD (gastroesophageal reflux disease)   . Chronic depression   . Hypercholesterolemia     intolerance of statins and niaspan  . OSA (obstructive sleep apnea)     mild, intolerant of cpap  . ACE-inhibitor cough   . Cataract   . Chronic headache   . Hearing loss   . History of blood transfusion 2013  . Coronary artery disease     a. s/p CABG x2 (LIMA-LAD and SVG-RCA)  b. NST neg for ischemia 07/24/14  . Diabetes mellitus     type 2  . Anxiety   . PONV (postoperative nausea and vomiting)   . Thyroid disease   . Arthritis   . Anemia     iron deficiency anemia  . H/O aortic valve replacement   . COPD (chronic obstructive pulmonary disease) ([place])   . Asthma     Past Surgical History  Procedure Laterality Date  . Cholecystectomy  2001  . Colectomy      lap sigmoid  . Tonsillectomy    . Bartholin gland cyst excision    . Tubal ligation    . Bladder suspension    . Breast cyst excision  1988    bilateral nonmalignant tumors, x3  . Vaginal delivery      3  . Thyroidectomy    .  Abdominal hysterectomy w/ partial vaginactomy    . Appendectomy  1964  . Colonoscopy  2014    polyps found, 2 clamped off.  . Cardiac catheterization    . Coronary angioplasty  10/29/2007    Prox RCA & Mid Cx.  . Coronary artery bypass graft N/A 10/12/2013    Procedure: CORONARY ARTERY BYPASS GRAFT TIMES TWO;  Surgeon: [name], MD;  Location: [facility];  Service: Open Heart Surgery;  Laterality: N/A;  . Aortic valve replacement N/A 10/12/2013    Procedure: AORTIC VALVE REPLACEMENT (AVR);  Surgeon: [name], MD;  Location: [facility];  Service: Open Heart Surgery;  Laterality: N/A;  . Cataract extraction w/ intraocular lens  implant, bilateral    . Abdominal hysterectomy    . Sternal wires removal N/A 04/13/2014    Procedure: STERNAL WIRES REMOVAL;  Surgeon: [name], MD;  Location: [facility] OR;  Service: Thoracic;  Laterality: N/A;  . Left heart catheterization with coronary angiogram N/A 10/09/2013    Procedure: LEFT HEART CATHETERIZATION WITH CORONARY ANGIOGRAM;  Surgeon: [name], MD;  Location: [facility] CATH LAB;  Service: Cardiovascular;  Laterality: N/A;  . Breast biopsy Bilateral 09/11/2000    neg  . Breast biopsy Left 07/24/2010    neg    Social History   Social History  . Marital Status: Married    Spouse Name: N/A  . Number of Children: 3  . Years of Education: N/A   Occupational History  . Retired     Quarry manager   Social History Main Topics  . Smoking status: Former Smoker -- 0.50 packs/day for 30 years    Types: Cigarettes    Quit date: 10/02/2013  . Smokeless tobacco: Never Used  . Alcohol Use: No  . Drug Use: No  . Sexual Activity: Not on file   Other Topics Concern  . Not on file   Social History Narrative   Does not have Living Will   Desires CPR, would not want prolonged life support if futile.    Family History  Problem Relation Age of Onset  . Breast cancer Mother 70  . Hypertension Father   . Mesothelioma Father   . Asthma Father   . Stroke  Paternal Grandfather   . Heart disease    . Breast cancer Maternal Aunt   . Breast cancer Paternal Aunt     Allergies  Allergen Reactions  . Amitriptyline Other (See Comments)    Unknown reaction  . Benadryl [Diphenhydramine] Shortness Of Breath  . Demerol [Meperidine] Other (See Comments)    Unknown reaction  . Gabapentin Other (See Comments)    Unknown reaction  . Loratadine Other (See Comments)    Unknown reaction  . Meperidine Hcl Other (See Comments)    Unknown reaction  . Mirtazapine Other (See Comments)    Unknown reaction  . Olanzapine Other (See Comments)    Unknown reaction   . Voltaren [Diclofenac Sodium] Shortness Of Breath  . Zetia [Ezetimibe] Other (See Comments)    Weakness in legs, shakiness all over  . Ativan [Lorazepam] Other (See Comments)    Causes double vision at highter than .5 mg dose  . Atorvastatin Other (See Comments)    Muscle aches and weakness  . Budesonide-Formoterol Fumarate Other (See Comments)    Shakiness, tremors  . Bupropion Hcl Other (See Comments)    "cloud over me" depression  . Caffeine Other (See Comments)    jitters  . Codeine Sulfate Other (See Comments)    Makes chest hurt like a heart attack  . Lisinopril Cough  . Metformin Nausea And Vomiting  . Mometasone Furoate Nausea And Vomiting  . Morphine Sulfate Other (See Comments)    Chest pain like a heart attack  . Other Other (See Comments)    Beta Blockers, reaction shortness of breath  . Oxycodone-Acetaminophen Nausea And Vomiting  . Pioglitazone Other (See Comments)    Cannot take because of risk of bladder cancer  . Propoxyphene N-Acetaminophen Nausea And Vomiting  . Rosuvastatin Other (See Comments)    Muscle aches and weakness  . Shellfish Allergy Diarrhea  . Tramadol Nausea Only  . Venlafaxine Other (See Comments)    Unknown  reaction  . Zolpidem Tartrate Other (See Comments)     Jittery, diarrhea  . Latex Rash    Medication list reviewed and updated in full  in [place].   GEN: No fevers, chills. Nontoxic. Primarily MSK c/o today. MSK: Detailed in the HPI GI: tolerating PO intake without difficulty Neuro: No numbness, parasthesias, or tingling associated. Otherwise the pertinent positives of the ROS are noted above.   Objective:   BP 140/80 mmHg  Pulse 73  Temp(Src) 99.3 F (37.4 C) (Oral)  Ht 5\' 6"  (1.676 m)  Wt 188 lb 8 oz (85.503 kg)  BMI 30.44 kg/m2   GEN: WDWN, NAD, Non-toxic, Alert & Oriented x 3 HEENT: Atraumatic, Normocephalic.  Ears and Nose: No external deformity. EXTR: No clubbing/cyanosis/edema NEURO: Normal gait.  PSYCH: Normally interactive. Conversant. Not depressed or anxious appearing.  Calm demeanor.   Shoulder: R and L Inspection: No muscle wasting or winging Ecchymosis/edema: neg  AC joint, scapula, clavicle: NT Cervical spine: NT, full ROM Spurling's: neg ABNORMAL SIDE TESTED: R UNLESS OTHERWISE NOTED, THE CONTRALATERAL SIDE HAS FULL RANGE OF MOTION. Abduction: 5/5, LIMITED TO 170 DEGREES Flexion: 5/5, LIMITED TO 170 DEGNO ROM  IR, lift-off: 5/5. TESTED AT 90 DEGREES OF ABDUCTION, LIMITED TO 30 DEGREES ER at neutral:  5/5, TESTED AT 90 DEGREES OF ABDUCTION, LIMITED TO 85 DEGREES AC crossover and compression: PAIN Drop Test: neg Empty Can: neg Supraspinatus insertion: NT Bicipital groove: NT Hawkins is pos C5-T1 intact Sensation intact Grip 5/5    On the right hand, the patient has extensive osteoarthritic changes and virtually all joints. There is significant triggering at the fourth digit on the right which is painful.  Radiology:  Assessment and Plan:   Right shoulder pain - Plan: methylPREDNISolone acetate (DEPO-MEDROL) injection 80 mg  Adhesive capsulitis of shoulder, right  Motion is improved after of breath, acute pain while sleeping in turning over. To me this suggests that she would likely for her capsule, and now she has improved range of motion.  I told the patient that I  cannot fully exclude a rotator cuff tear, but this point she does not want to have any additional imaging such as an MRI, and she has not open to considering operative intervention.  For now, we are going to do an intra-articular injection for pain control, and I strongly urged her to continue with her range of motion at home.  Intrarticular Shoulder Injection, R Verbal consent was obtained from the patient. Risks including infection explained and contrasted with benefits and alternatives. Patient prepped with Chloraprep and Ethyl Chloride used for anesthesia. An intraarticular shoulder injection was performed using the posterior approach. The patient tolerated the procedure well and had decreased pain post injection. No complications. Injection: 8 cc of Lidocaine 1% and 2 mL Depo-Medrol 40 mg. Needle: 22 gauge   Follow-up: 2 mo  Signed,  Ericson Nafziger T. Keymiah Lyles, MD   Patient's Medications  New Prescriptions   No medications on file  Previous Medications   ALBUTEROL (PROVENTIL HFA;VENTOLIN HFA) 108 (90 BASE) MCG/ACT INHALER    Inhale 2 puffs into the lungs 2 (two) times daily at 10 AM and 5 PM.   ASPIRIN EC 81 MG TABLET    Take 81 mg by mouth daily.   BENZONATATE (TESSALON) 200 MG CAPSULE    Take by mouth.   CALCIUM-VITAMIN D (CALTRATE 600 PLUS-VIT D PO)    Take 2 tablets by mouth 2 (two) times daily.    CAPSAICIN-LIDOCAINE-MENTHOL 0.05-5-3 %  CREA    Apply 1 application topically 2 (two) times daily. Do not leave on for longer than 8 hours a day   CITALOPRAM (CELEXA) 20 MG TABLET    TAKE ONE TABLET BY MOUTH ONCE DAILY   DOXYCYCLINE (VIBRA-TABS) 100 MG TABLET    Take by mouth.   FLUOXETINE (PROZAC) 40 MG CAPSULE    Take 40 mg by mouth 2 (two) times daily.   INSULIN LISPRO PROT & LISPRO (HUMALOG MIX 75/25 KWIKPEN) (75-25) 100 UNIT/ML KWIKPEN    Inject 60 Units into the skin 2 (two) times daily.   LEVOTHYROXINE (SYNTHROID, LEVOTHROID) 150 MCG TABLET    Take 150 mcg by mouth daily before  breakfast.   ONETOUCH DELICA LANCETS 99991111 MISC    USE TO CHECK BLOOD SUGAR THREE TIMES DAILY   TRAZODONE (DESYREL) 100 MG TABLET    TAKE TWO TABLETS BY MOUTH AT BEDTIME  Modified Medications   No medications on file  Discontinued Medications   No medications on file

## 2015-11-14 NOTE — Discharge Instructions (Signed)
Please seek medical attention for any high fevers, chest pain, shortness of breath, change in behavior, persistent vomiting, bloody stool or any other new or concerning symptoms.   Shoulder Pain The shoulder is the joint that connects your arm to your body. Muscles and band-like tissues that connect bones to muscles (tendons) hold the joint together. Shoulder pain is felt if an injury or medical problem affects one or more parts of the shoulder. HOME CARE   Put ice on the sore area.  Put ice in a plastic bag.  Place a towel between your skin and the bag.  Leave the ice on for 15-20 minutes, 03-04 times a day for the first 2 days.  Stop using cold packs if they do not help with the pain.  If you were given something to keep your shoulder from moving (sling; shoulder immobilizer), wear it as told. Only take it off to shower or bathe.  Move your arm as little as possible, but keep your hand moving to prevent puffiness (swelling).  Squeeze a soft ball or foam pad as much as possible to help prevent swelling.  Take medicine as told by your doctor. GET HELP IF:  You have progressing new pain in your arm, hand, or fingers.  Your hand or fingers get cold.  Your medicine does not help lessen your pain. GET HELP RIGHT AWAY IF:   Your arm, hand, or fingers are numb or tingling.  Your arm, hand, or fingers are puffy (swollen), painful, or turn white or blue. MAKE SURE YOU:   Understand these instructions.  Will watch your condition.  Will get help right away if you are not doing well or get worse.   This information is not intended to replace advice given to you by your health care provider. Make sure you discuss any questions you have with your health care provider.   Document Released: 11/07/2007 Document Revised: 06/11/2014 Document Reviewed: 09/13/2014 Elsevier Interactive Patient Education [employer].

## 2015-11-17 DIAGNOSIS — E781 Pure hyperglyceridemia: Secondary | ICD-10-CM | POA: Diagnosis not present

## 2015-11-17 DIAGNOSIS — E119 Type 2 diabetes mellitus without complications: Secondary | ICD-10-CM | POA: Diagnosis not present

## 2015-11-17 DIAGNOSIS — E039 Hypothyroidism, unspecified: Secondary | ICD-10-CM | POA: Diagnosis not present

## 2015-11-17 DIAGNOSIS — Z794 Long term (current) use of insulin: Secondary | ICD-10-CM | POA: Diagnosis not present

## 2015-11-24 DIAGNOSIS — E7800 Pure hypercholesterolemia, unspecified: Secondary | ICD-10-CM | POA: Diagnosis not present

## 2015-11-24 DIAGNOSIS — F3341 Major depressive disorder, recurrent, in partial remission: Secondary | ICD-10-CM | POA: Diagnosis not present

## 2015-11-24 DIAGNOSIS — E1165 Type 2 diabetes mellitus with hyperglycemia: Secondary | ICD-10-CM | POA: Diagnosis not present

## 2015-11-24 DIAGNOSIS — K219 Gastro-esophageal reflux disease without esophagitis: Secondary | ICD-10-CM | POA: Diagnosis not present

## 2015-11-24 DIAGNOSIS — E039 Hypothyroidism, unspecified: Secondary | ICD-10-CM | POA: Diagnosis not present

## 2015-11-24 DIAGNOSIS — Z794 Long term (current) use of insulin: Secondary | ICD-10-CM | POA: Diagnosis not present

## 2015-11-24 DIAGNOSIS — J449 Chronic obstructive pulmonary disease, unspecified: Secondary | ICD-10-CM | POA: Diagnosis not present

## 2015-12-09 DIAGNOSIS — J301 Allergic rhinitis due to pollen: Secondary | ICD-10-CM | POA: Diagnosis not present

## 2015-12-12 DIAGNOSIS — J301 Allergic rhinitis due to pollen: Secondary | ICD-10-CM | POA: Diagnosis not present

## 2016-02-10 DIAGNOSIS — Z23 Encounter for immunization: Secondary | ICD-10-CM | POA: Diagnosis not present

## 2016-02-17 DIAGNOSIS — E1165 Type 2 diabetes mellitus with hyperglycemia: Secondary | ICD-10-CM | POA: Diagnosis not present

## 2016-02-17 DIAGNOSIS — Z794 Long term (current) use of insulin: Secondary | ICD-10-CM | POA: Diagnosis not present

## 2016-02-24 DIAGNOSIS — E1165 Type 2 diabetes mellitus with hyperglycemia: Secondary | ICD-10-CM | POA: Diagnosis not present

## 2016-02-24 DIAGNOSIS — E039 Hypothyroidism, unspecified: Secondary | ICD-10-CM | POA: Diagnosis not present

## 2016-02-24 DIAGNOSIS — Z794 Long term (current) use of insulin: Secondary | ICD-10-CM | POA: Diagnosis not present

## 2016-03-02 DIAGNOSIS — J301 Allergic rhinitis due to pollen: Secondary | ICD-10-CM | POA: Diagnosis not present

## 2016-03-12 DIAGNOSIS — J301 Allergic rhinitis due to pollen: Secondary | ICD-10-CM | POA: Diagnosis not present

## 2016-03-29 ENCOUNTER — Encounter: Payer: Self-pay | Admitting: Family Medicine

## 2016-03-29 ENCOUNTER — Ambulatory Visit (INDEPENDENT_AMBULATORY_CARE_PROVIDER_SITE_OTHER): Payer: Medicare Other | Admitting: Family Medicine

## 2016-03-29 VITALS — BP 110/72 | HR 70 | Temp 98.2°F | Ht 66.0 in | Wt 196.0 lb

## 2016-03-29 DIAGNOSIS — M25512 Pain in left shoulder: Secondary | ICD-10-CM

## 2016-03-29 MED ORDER — METHYLPREDNISOLONE ACETATE 40 MG/ML IJ SUSP
80.0000 mg | Freq: Once | INTRAMUSCULAR | Status: AC
  Administered 2016-03-29: 80 mg via INTRA_ARTICULAR

## 2016-03-29 NOTE — Progress Notes (Signed)
Pre visit review using our clinic review tool, if applicable. No additional management support is needed unless otherwise documented below in the visit note. 

## 2016-03-29 NOTE — Progress Notes (Signed)
   Dr. Frederico Hamman T. Yadriel Kerrigan, MD, [facility] and [facility] Medicine [place], [address] Phone: [phone] Fax: [phone]  03/29/2016  Patient: [name], MRN: [id number], DOB: [birth year], 70 y.o.  Primary Physician:  Glendon Axe, MD   Chief Complaint  Patient presents with  . Shoulder Pain    Left injection   Subjective:   [name] is a 70 y.o. very pleasant female patient who presents with the following:  Pleasant patient known well - had history of R frozen shoulder for extended period of time, now with L shoulder pain.  Proc only  Intrarticular Shoulder Injection, L Verbal consent was obtained from the patient. Risks including infection explained and contrasted with benefits and alternatives. Patient prepped with Chloraprep and Ethyl Chloride used for anesthesia. An intraarticular shoulder injection was performed using the posterior approach. The patient tolerated the procedure well and had decreased pain post injection. No complications. Injection: 8 cc of Lidocaine 1% and 2 mL Depo-Medrol 40 mg. Needle: 22 gauge   Signed,  Wilmoth Rasnic T. Viridiana Spaid, MD

## 2016-04-25 DIAGNOSIS — J449 Chronic obstructive pulmonary disease, unspecified: Secondary | ICD-10-CM | POA: Diagnosis not present

## 2016-04-25 DIAGNOSIS — R0602 Shortness of breath: Secondary | ICD-10-CM | POA: Diagnosis not present

## 2016-04-25 DIAGNOSIS — K219 Gastro-esophageal reflux disease without esophagitis: Secondary | ICD-10-CM | POA: Diagnosis not present

## 2016-04-25 DIAGNOSIS — R5383 Other fatigue: Secondary | ICD-10-CM | POA: Diagnosis not present

## 2016-04-25 DIAGNOSIS — I2510 Atherosclerotic heart disease of native coronary artery without angina pectoris: Secondary | ICD-10-CM | POA: Diagnosis not present

## 2016-04-25 DIAGNOSIS — R112 Nausea with vomiting, unspecified: Secondary | ICD-10-CM | POA: Diagnosis not present

## 2016-04-25 DIAGNOSIS — M545 Low back pain: Secondary | ICD-10-CM | POA: Diagnosis not present

## 2016-05-09 DIAGNOSIS — R112 Nausea with vomiting, unspecified: Secondary | ICD-10-CM | POA: Diagnosis not present

## 2016-05-09 DIAGNOSIS — F419 Anxiety disorder, unspecified: Secondary | ICD-10-CM | POA: Diagnosis not present

## 2016-05-09 DIAGNOSIS — I2510 Atherosclerotic heart disease of native coronary artery without angina pectoris: Secondary | ICD-10-CM | POA: Diagnosis not present

## 2016-05-09 DIAGNOSIS — J449 Chronic obstructive pulmonary disease, unspecified: Secondary | ICD-10-CM | POA: Diagnosis not present

## 2016-05-09 DIAGNOSIS — F3341 Major depressive disorder, recurrent, in partial remission: Secondary | ICD-10-CM | POA: Diagnosis not present

## 2016-05-16 ENCOUNTER — Encounter: Payer: Self-pay | Admitting: Physician Assistant

## 2016-05-16 ENCOUNTER — Ambulatory Visit (INDEPENDENT_AMBULATORY_CARE_PROVIDER_SITE_OTHER): Payer: Medicare Other | Admitting: Physician Assistant

## 2016-05-16 VITALS — BP 148/82 | HR 56 | Ht 66.0 in | Wt 195.0 lb

## 2016-05-16 DIAGNOSIS — I25708 Atherosclerosis of coronary artery bypass graft(s), unspecified, with other forms of angina pectoris: Secondary | ICD-10-CM

## 2016-05-16 DIAGNOSIS — R002 Palpitations: Secondary | ICD-10-CM | POA: Diagnosis not present

## 2016-05-16 DIAGNOSIS — Z952 Presence of prosthetic heart valve: Secondary | ICD-10-CM

## 2016-05-16 DIAGNOSIS — R0602 Shortness of breath: Secondary | ICD-10-CM | POA: Diagnosis not present

## 2016-05-16 DIAGNOSIS — R079 Chest pain, unspecified: Secondary | ICD-10-CM | POA: Diagnosis not present

## 2016-05-16 DIAGNOSIS — E785 Hyperlipidemia, unspecified: Secondary | ICD-10-CM | POA: Diagnosis not present

## 2016-05-16 DIAGNOSIS — I441 Atrioventricular block, second degree: Secondary | ICD-10-CM

## 2016-05-16 DIAGNOSIS — I10 Essential (primary) hypertension: Secondary | ICD-10-CM

## 2016-05-16 MED ORDER — NITROGLYCERIN 0.4 MG SL SUBL: 0.4000 mg | SUBLINGUAL_TABLET | SUBLINGUAL | 1 refills | Status: DC | PRN

## 2016-05-16 NOTE — Patient Instructions (Addendum)
Medication Instructions:  Your physician has recommended you make the following change in your medication:   1) START Nitroglycerin 0.4 mg tablet -- take 1 tablet every 5 minutes for chest   pain. You may take no more than 3 doses. See instructions below for   correct usage.  2) INCREASE your Nexium to double what you are taking now  Labwork: Troponin today  Testing/Procedures: Your physician has recommended that you wear a holter monitor. Holter monitors are medical devices that record the heart's electrical activity. Doctors most often use these monitors to diagnose arrhythmias. Arrhythmias are problems with the speed or rhythm of the heartbeat. The monitor is a small, portable device. You can wear one while you do your normal daily activities. This is usually used to diagnose what is causing palpitations/syncope (passing out).  Your physician has requested that you have a lexiscan myoview. For further information please visit [email or web address]. Please follow instruction sheet, as given.  Your physician has requested that you have an echocardiogram. Echocardiography is a painless test that uses sound waves to create images of your heart. It provides your doctor with information about the size and shape of your heart and how well your heart's chambers and valves are working. This procedure takes approximately one hour. There are no restrictions for this procedure.  Follow-Up: Your physician recommends that you schedule a follow-up appointment in: 2 weeks with an extender, on a day that Dr. Angelena Form is in the office.  If you need a refill on your cardiac medications before your next appointment, please call your pharmacy.  Thank you for choosing [facility]!!    Any Other Special Instructions Will Be Listed Below (If Applicable). Nitroglycerin sublingual tablets What is this medicine? NITROGLYCERIN (nye troe GLI ser in) is a type of vasodilator. It relaxes blood vessels, increasing  the blood and oxygen supply to your heart. This medicine is used to relieve chest pain caused by angina. It is also used to prevent chest pain before activities like climbing stairs, going outdoors in cold weather, or sexual activity. This medicine may be used for other purposes; ask your health care provider or pharmacist if you have questions. COMMON BRAND NAME(S): Nitroquick, Nitrostat, Nitrotab What should I tell my health care provider before I take this medicine? They need to know if you have any of these conditions: -anemia -head injury, recent stroke, or bleeding in the brain -liver disease -previous heart attack -an unusual or allergic reaction to nitroglycerin, other medicines, foods, dyes, or preservatives -pregnant or trying to get pregnant -breast-feeding How should I use this medicine? Take this medicine by mouth as needed. At the first sign of an angina attack (chest pain or tightness) place one tablet under your tongue. You can also take this medicine 5 to 10 minutes before an event likely to produce chest pain. Follow the directions on the prescription label. Let the tablet dissolve under the tongue. Do not swallow whole. Replace the dose if you accidentally swallow it. It will help if your mouth is not dry. Saliva around the tablet will help it to dissolve more quickly. Do not eat or drink, smoke or chew tobacco while a tablet is dissolving. If you are not better within 5 minutes after taking ONE dose of nitroglycerin, call 9-1-1 immediately to seek emergency medical care. Do not take more than 3 nitroglycerin tablets over 15 minutes. If you take this medicine often to relieve symptoms of angina, your doctor or health care professional may provide you  with different instructions to manage your symptoms. If symptoms do not go away after following these instructions, it is important to call 9-1-1 immediately. Do not take more than 3 nitroglycerin tablets over 15 minutes. Talk to your  pediatrician regarding the use of this medicine in children. Special care may be needed. Overdosage: If you think you have taken too much of this medicine contact a poison control center or emergency room at once. NOTE: This medicine is only for you. Do not share this medicine with others. What if I miss a dose? This does not apply. This medicine is only used as needed. What may interact with this medicine? Do not take this medicine with any of the following medications: -certain migraine medicines like ergotamine and dihydroergotamine (DHE) -medicines used to treat erectile dysfunction like sildenafil, tadalafil, and vardenafil -riociguat This medicine may also interact with the following medications: -alteplase -aspirin -heparin -medicines for high blood pressure -medicines for mental depression -other medicines used to treat angina -phenothiazines like chlorpromazine, mesoridazine, prochlorperazine, thioridazine This list may not describe all possible interactions. Give your health care provider a list of all the medicines, herbs, non-prescription drugs, or dietary supplements you use. Also tell them if you smoke, drink alcohol, or use illegal drugs. Some items may interact with your medicine. What should I watch for while using this medicine? Tell your doctor or health care professional if you feel your medicine is no longer working. Keep this medicine with you at all times. Sit or lie down when you take your medicine to prevent falling if you feel dizzy or faint after using it. Try to remain calm. This will help you to feel better faster. If you feel dizzy, take several deep breaths and lie down with your feet propped up, or bend forward with your head resting between your knees. You may get drowsy or dizzy. Do not drive, use machinery, or do anything that needs mental alertness until you know how this drug affects you. Do not stand or sit up quickly, especially if you are an older  patient. This reduces the risk of dizzy or fainting spells. Alcohol can make you more drowsy and dizzy. Avoid alcoholic drinks. Do not treat yourself for coughs, colds, or pain while you are taking this medicine without asking your doctor or health care professional for advice. Some ingredients may increase your blood pressure. What side effects may I notice from receiving this medicine? Side effects that you should report to your doctor or health care professional as soon as possible: -blurred vision -dry mouth -skin rash -sweating -the feeling of extreme pressure in the head -unusually weak or tired Side effects that usually do not require medical attention (report to your doctor or health care professional if they continue or are bothersome): -flushing of the face or neck -headache -irregular heartbeat, palpitations -nausea, vomiting This list may not describe all possible side effects. Call your doctor for medical advice about side effects. You may report side effects to FDA at 1-800-FDA-1088. Where should I keep my medicine? Keep out of the reach of children. Store at room temperature between 20 and 25 degrees C (68 and 77 degrees F). Store in [employer]. Protect from light and moisture. Keep tightly closed. Throw away any unused medicine after the expiration date. NOTE: This sheet is a summary. It may not cover all possible information. If you have questions about this medicine, talk to your doctor, pharmacist, or health care provider.  2017 Elsevier/Gold Standard (2013-03-19 17:57:36)  Holter Monitoring Introduction A Holter monitor is a small device that is used to detect abnormal heart rhythms. It clips to your clothing and is connected by wires to flat, sticky disks (electrodes) that attach to your chest. It is worn continuously for 24-48 hours. Follow these instructions at home:  Wear your Holter monitor at all times, even while exercising and sleeping, for as long as  directed by your health care provider.  Make sure that the Holter monitor is safely clipped to your clothing or close to your body as recommended by your health care provider.  Do not get the monitor or wires wet.  Do not put body lotion or moisturizer on your chest.  Keep your skin clean.  Keep a diary of your daily activities, such as walking and doing chores. If you feel that your heartbeat is abnormal or that your heart is fluttering or skipping a beat:  Record what you are doing when it happens.  Record what time of day the symptoms occur.  Return your Holter monitor as directed by your health care provider.  Keep all follow-up visits as directed by your health care provider. This is important. Get help right away if:  You feel lightheaded or you faint.  You have trouble breathing.  You feel pain in your chest, upper arm, or jaw.  You feel sick to your stomach and your skin is pale, cool, or damp.  You heartbeat feels unusual or abnormal. This information is not intended to replace advice given to you by your health care provider. Make sure you discuss any questions you have with your health care provider. Document Released: 02/17/2004 Document Revised: 10/27/2015 Document Reviewed: 12/28/2013  2017 Elsevier  Pharmacologic Stress Electrocardiogram A pharmacologic stress electrocardiogram is a heart (cardiac) test that uses nuclear imaging to evaluate the blood supply to your heart. This test may also be called a pharmacologic stress electrocardiography. Pharmacologic means that a medicine is used to increase your heart rate and blood pressure.  This stress test is done to find areas of poor blood flow to the heart by determining the extent of coronary artery disease (CAD). Some people exercise on a treadmill, which naturally increases the blood flow to the heart. For those people unable to exercise on a treadmill, a medicine is used. This medicine stimulates your heart and  will cause your heart to beat harder and more quickly, as if you were exercising.  Pharmacologic stress tests can help determine:  The adequacy of blood flow to your heart during increased levels of activity in order to clear you for discharge home.  The extent of coronary artery blockage caused by CAD.  Your prognosis if you have suffered a heart attack.  The effectiveness of cardiac procedures done, such as an angioplasty, which can increase the circulation in your coronary arteries.  Causes of chest pain or pressure. LET [facility] CARE PROVIDER KNOW ABOUT:  Any allergies you have.  All medicines you are taking, including vitamins, herbs, eye drops, creams, and over-the-counter medicines.  Previous problems you or members of your family have had with the use of anesthetics.  Any blood disorders you have.  Previous surgeries you have had.  Medical conditions you have.  Possibility of pregnancy, if this applies.  If you are currently breastfeeding. RISKS AND COMPLICATIONS Generally, this is a safe procedure. However, as with any procedure, complications can occur. Possible complications include:  You develop pain or pressure in the following areas:  Chest.  Jaw  or neck.  Between your shoulder blades.  Radiating down your left arm.  Headache.  Dizziness or light-headedness.  Shortness of breath.  Increased or irregular heartbeat.  Low blood pressure.  Nausea or vomiting.  Flushing.  Redness going up the arm and slight pain during injection of medicine.  Heart attack (rare). BEFORE THE PROCEDURE   Avoid all forms of caffeine for 24 hours before your test or as directed by your health care provider. This includes coffee, tea (even decaffeinated tea), caffeinated sodas, chocolate, cocoa, and certain pain medicines.  Follow your health care provider's instructions regarding eating and drinking before the test.  Take your medicines as directed at regular  times with water unless instructed otherwise. Exceptions may include:  If you have diabetes, ask how you are to take your insulin or pills. It is common to adjust insulin dosing the morning of the test.  If you are taking beta-blocker medicines, it is important to talk to your health care provider about these medicines well before the date of your test. Taking beta-blocker medicines may interfere with the test. In some cases, these medicines need to be changed or stopped 24 hours or more before the test.  If you wear a nitroglycerin patch, it may need to be removed prior to the test. Ask your health care provider if the patch should be removed before the test.  If you use an inhaler for any breathing condition, bring it with you to the test.  If you are an outpatient, bring a snack so you can eat right after the stress phase of the test.  Do not smoke for 4 hours prior to the test or as directed by your health care provider.  Do not apply lotions, powders, creams, or oils on your chest prior to the test.  Wear comfortable shoes and clothing. Let your health care provider know if you were unable to complete or follow the preparations for your test. PROCEDURE   Multiple patches (electrodes) will be put on your chest. If needed, small areas of your chest may be shaved to get better contact with the electrodes. Once the electrodes are attached to your body, multiple wires will be attached to the electrodes, and your heart rate will be monitored.  An IV access will be started. A nuclear trace (isotope) is given. The isotope may be given intravenously, or it may be swallowed. Nuclear refers to several types of radioactive isotopes, and the nuclear isotope lights up the arteries so that the nuclear images are clear. The isotope is absorbed by your body. This results in low radiation exposure.  A resting nuclear image is taken to show how your heart functions at rest.  A medicine is given through  the IV access.  A second scan is done about 1 hour after the medicine injection and determines how your heart functions under stress.  During this stress phase, you will be connected to an electrocardiogram machine. Your blood pressure and oxygen levels will be monitored. AFTER THE PROCEDURE   Your heart rate and blood pressure will be monitored after the test.  You may return to your normal schedule, including diet,activities, and medicines, unless your health care provider tells you otherwise. This information is not intended to replace advice given to you by your health care provider. Make sure you discuss any questions you have with your health care provider. Document Released: 10/07/2008 Document Revised: 05/26/2013 Document Reviewed: 01/26/2013 Elsevier Interactive Patient Education  2017 [employer].  Echocardiogram  An echocardiogram, or echocardiography, uses sound waves (ultrasound) to produce an image of your heart. The echocardiogram is simple, painless, obtained within a short period of time, and offers valuable information to your health care provider. The images from an echocardiogram can provide information such as:  Evidence of coronary artery disease (CAD).  Heart size.  Heart muscle function.  Heart valve function.  Aneurysm detection.  Evidence of a past heart attack.  Fluid buildup around the heart.  Heart muscle thickening.  Assess heart valve function. Tell a health care provider about:  Any allergies you have.  All medicines you are taking, including vitamins, herbs, eye drops, creams, and over-the-counter medicines.  Any problems you or family members have had with anesthetic medicines.  Any blood disorders you have.  Any surgeries you have had.  Any medical conditions you have.  Whether you are pregnant or may be pregnant. What happens before the procedure? No special preparation is needed. Eat and drink normally. What happens during the  procedure?  In order to produce an image of your heart, gel will be applied to your chest and a wand-like tool (transducer) will be moved over your chest. The gel will help transmit the sound waves from the transducer. The sound waves will harmlessly bounce off your heart to allow the heart images to be captured in real-time motion. These images will then be recorded.  You may need an IV to receive a medicine that improves the quality of the pictures. What happens after the procedure? You may return to your normal schedule including diet, activities, and medicines, unless your health care provider tells you otherwise. This information is not intended to replace advice given to you by your health care provider. Make sure you discuss any questions you have with your health care provider. Document Released: 05/18/2000 Document Revised: 01/07/2016 Document Reviewed: 01/26/2013 Elsevier Interactive Patient Education  2017 [employer].

## 2016-05-16 NOTE — Progress Notes (Signed)
Cardiology Office Note    Date:  05/16/2016   ID:  [name], DOB 03-Jul-1945, MRN [id number]  PCP:  Glendon Axe, MD  Cardiologist:  Dr. Angelena Form  Chief Complaint: Chest pain   History of Present Illness:   [name] is a 70 y.o. female with history of anemia, HTN, HLD, DM, COPD, CAD s/p prior stent placements followed by CABG and AVR May 2015 who presented for chest pain.   She had a DES placed in the RCA and mid Circumflex in May 2009. She was admitted 10/2013 at [facility] with chest pain. Echo demonstrated normal LVF, mod AI and mild AS. Myoview was negative for ischemia. She followed up with Dr. Ida Rogue in [place] with continued symptoms and was set up for [facility]. This demonstrated 3v CAD with severe prox LAD stenosis and ostial RCA. She was referred for CABG. Intraoperative TEE demonstrated mild AS and mod to severe AI. She underwent CABG with Dr. Cyndia Bent 10/12/13 (L-LAD, S-RCA) + AVR with a bioprosthetic valve (21 mm [employer]).  Admitted to [facility] 07/26/14 with chest pain. Stress myoview February 2016 with no ischemia. She does not tolerate statins or Zetia. Seen in lipid clinic Feb 2016 and tried on low dose Pravachol but did not tolerate.   Last seen by Dr. Angelena Form 10/2015. She was doing well on cardiac stand point. She is going to try PCSK9 inhibitor.   Added to my schedule for chest pain. She has been having intermittent chest pain for the past 2 months. Her chest pain starts underneath her L breast and radiates to L side of sternum area and then to left upper chest. Describes the pain as achy. Occasional associated with dyspnea. No nausea, vomiting or diaphoresis. Yesterday she has pain all day. This morning she had 5/10 chest pressure that resoled after taking hot bath. Currently pain free. Complains of intermittent "skip beats" with dizziness. No syncope. Recently tired and fatigue. She was dx with H.pylori 2 week and treated with Abx and PPI. No orthopnea, PND,  syncope or LE edema. Complains of abdominal tightness and bloating.   Past Medical History:  Diagnosis Date  . ACE-inhibitor cough   . Allergic rhinitis   . Anemia    iron deficiency anemia  . Anxiety   . Arthritis   . Asthma   . Cataract   . Chronic depression   . Chronic headache   . COPD (chronic obstructive pulmonary disease) ([place])   . Coronary artery disease    a. s/p CABG x2 (LIMA-LAD and SVG-RCA)  b. NST neg for ischemia 07/24/14  . Diabetes mellitus    type 2  . Diverticulitis of colon   . GERD (gastroesophageal reflux disease)   . H/O aortic valve replacement   . Hearing loss   . History of blood transfusion 2013  . Hypercholesterolemia    intolerance of statins and niaspan  . OSA (obstructive sleep apnea)    mild, intolerant of cpap  . PONV (postoperative nausea and vomiting)   . Thyroid disease     Past Surgical History:  Procedure Laterality Date  . ABDOMINAL HYSTERECTOMY    . ABDOMINAL HYSTERECTOMY W/ PARTIAL VAGINACTOMY    . AORTIC VALVE REPLACEMENT N/A 10/12/2013   Procedure: AORTIC VALVE REPLACEMENT (AVR);  Surgeon: [name], MD;  Location: [place];  Service: Open Heart Surgery;  Laterality: N/A;  . [place]  . BARTHOLIN GLAND CYST EXCISION    . BLADDER SUSPENSION    .  BREAST BIOPSY Bilateral 09/11/2000   neg  . BREAST BIOPSY Left 07/24/2010   neg  . BREAST CYST EXCISION  1988   bilateral nonmalignant tumors, x3  . CARDIAC CATHETERIZATION    . CATARACT EXTRACTION W/ INTRAOCULAR LENS  IMPLANT, BILATERAL    . CHOLECYSTECTOMY  2001  . COLECTOMY     lap sigmoid  . COLONOSCOPY  2014   polyps found, 2 clamped off.  . CORONARY ANGIOPLASTY  10/29/2007   Prox RCA & Mid Cx.  . CORONARY ARTERY BYPASS GRAFT N/A 10/12/2013   Procedure: CORONARY ARTERY BYPASS GRAFT TIMES TWO;  Surgeon: [name], MD;  Location: [place];  Service: Open Heart Surgery;  Laterality: N/A;  . LEFT HEART CATHETERIZATION WITH CORONARY ANGIOGRAM N/A 10/09/2013   Procedure:  LEFT HEART CATHETERIZATION WITH CORONARY ANGIOGRAM;  Surgeon: [name], MD;  Location: [facility] C[facility] LAB;  Service: Cardiovascular;  Laterality: N/A;  . STERNAL WIRES REMOVAL N/A 04/13/2014   Procedure: STERNAL WIRES REMOVAL;  Surgeon: [name], MD;  Location: [facility] OR;  Service: Thoracic;  Laterality: N/A;  . THYROIDECTOMY    . TONSILLECTOMY    . TUBAL LIGATION    . VAGINAL DELIVERY     3    Current Medications: Prior to Admission medications   Medication Sig Start Date End Date Taking? Authorizing Provider  albuterol (PROVENTIL HFA;VENTOLIN HFA) 108 (90 Base) MCG/ACT inhaler Inhale 2 puffs into the lungs 2 (two) times daily at 10 AM and 5 PM. 07/22/15   Flora Lipps, MD  aspirin EC 81 MG tablet Take 81 mg by mouth daily.    Historical Provider, MD  Calcium-Vitamin D (CALTRATE 600 PLUS-VIT D PO) Take 2 tablets by mouth 2 (two) times daily.     Historical Provider, MD  citalopram (CELEXA) 20 MG tablet TAKE ONE TABLET BY MOUTH ONCE DAILY 05/18/15   Lucille Passy, MD  FLUoxetine (PROZAC) 40 MG capsule Take 40 mg by mouth 2 (two) times daily. 07/29/15   Historical Provider, MD  Insulin Lispro Prot & Lispro (HUMALOG MIX 75/25 KWIKPEN) (75-25) 100 UNIT/ML Kwikpen Inject 60 Units into the skin 2 (two) times daily.    Historical Provider, MD  levothyroxine (SYNTHROID, LEVOTHROID) 150 MCG tablet Take 150 mcg by mouth daily before breakfast.    Historical Provider, MD  [facility] DELICA LANCETS [id number] MISC USE TO [place] 05/17/14   Historical Provider, MD  traZODone (DESYREL) 100 MG tablet TAKE TWO TABLETS BY MOUTH AT BEDTIME 08/15/15   Lucille Passy, MD    Allergies:   Amitriptyline; Benadryl [diphenhydramine]; Demerol [meperidine]; Gabapentin; Loratadine; Meperidine hcl; Mirtazapine; Olanzapine; Voltaren [diclofenac sodium]; Zetia [ezetimibe]; Ativan [lorazepam]; Atorvastatin; Budesonide-formoterol fumarate; Bupropion hcl; Caffeine; Codeine sulfate; Lisinopril; Metformin;  Mometasone furoate; Morphine sulfate; Other; Oxycodone-acetaminophen; Pioglitazone; Propoxyphene n-acetaminophen; Rosuvastatin; Shellfish allergy; Tramadol; Venlafaxine; Zolpidem tartrate; and Latex   Social History   Social History  . Marital status: Married    Spouse name: N/A  . Number of children: 3  . Years of education: N/A   Occupational History  . 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 Smoking status: Former Smoker    Packs/day: 0.50    Years: 30.00    Types: Cigarettes    Quit date: 10/02/2013  . Smokeless tobacco: Never Used  . Alcohol use No  . Drug use: No  . Sexual activity: Not on file   Other Topics Concern  . Not on file  Social History Narrative   Does not have Living Will   Desires CPR, would not want prolonged life support if futile.     Family History:  The patient's family history includes Asthma in her father; Breast cancer in her maternal aunt and paternal aunt; Breast cancer (age of onset: 69) in her mother; Hypertension in her father; Mesothelioma in her father; Stroke in her paternal grandfather.   ROS:   Please see the history of present illness.    ROS All other systems reviewed and are negative.   PHYSICAL EXAM:   VS:  BP (!) 148/82   Pulse (!) 56   Ht 5\' 6"  (1.676 m)   Wt 195 lb (88.5 kg)   BMI 31.47 kg/m    GEN: Well nourished, well developed, in no acute distress  HEENT: normal  Neck: no JVD, carotid bruits, or masses Cardiac: RRR; Systolic murmurs, rubs, or gallops,no edema  Respiratory:  clear to auscultation bilaterally, normal work of breathing GI: soft, nontender, distended, + BS MS: no deformity or atrophy  Skin: warm and dry, no rash Neuro:  Alert and Oriented x 3, Strength and sensation are intact Psych: euthymic mood, full affect  Wt Readings from Last 3 Encounters:  05/16/16 195 lb (88.5 kg)  03/29/16 196 lb (88.9 kg)  11/14/15 188 lb 8 oz (85.5 kg)      Studies/Labs Reviewed:   EKG:  EKG is  ordered today.  The ekg ordered today demonstrates sinus rhythm at rate of 56 bpm and 2nd AV block, type I.   Recent Labs: 06/08/2015: BUN 21; Potassium 4.3; Sodium 137 06/22/2015: Hemoglobin 12.1; Platelets 191 07/19/2015: Creatinine, Ser 0.90   Lipid Panel    Component Value Date/Time   CHOL 206 (H) 03/16/2015 0759   CHOL 211 (H) 10/03/2013 0515   TRIG 369 (H) 03/16/2015 0759   TRIG 272 (H) 10/03/2013 0515   HDL 21 (L) 03/16/2015 0759   HDL 22 (L) 10/03/2013 0515   CHOLHDL 9.8 (H) 03/16/2015 0759   VLDL 74 (H) 03/16/2015 0759   VLDL 54 (H) 10/03/2013 0515   LDLCALC 111 03/16/2015 0759   LDLCALC 135 (H) 10/03/2013 0515   LDLDIRECT 127.0 07/07/2014 0820    Additional studies/ records that were reviewed today include:   Echocardiogram: 11/2013 LV EF: 55%  ------------------------------------------------------------------- Indications:   S/P AVR (V43.3). Aortic stenosis 424.1.  ------------------------------------------------------------------- History:  PMH: Acquired from the patient and from the patient&'s chart. Chest pain. Palpitations and dyspnea. Coronary artery disease. Aortic valve disease. Chronic obstructive pulmonary disease. Risk factors: Hypertension. Diabetes mellitus. Dyslipidemia.  ------------------------------------------------------------------- Study Conclusions  - Left ventricle: The cavity size was normal. There was mild concentric hypertrophy. Systolic function was normal. The estimated ejection fraction was 55%. Wall motion was normal; there were no regional wall motion abnormalities. There was an increased relative contribution of atrial contraction to ventricular filling. Doppler parameters are consistent with abnormal left ventricular relaxation (grade 1 diastolic dysfunction). - Aortic valve: A bioprosthesis was present and functioning normally. - Mitral valve: Moderately calcified annulus. Moderate thickening. -  Left atrium: The atrium was mildly dilated. - Tricuspid valve: There was mild regurgitation.  Myoview 07/2014 IMPRESSION: 1. No reversible ischemia or infarction.  2. Normal left ventricular wall motion.  3. Left ventricular ejection fraction 72%  4. Low-risk stress test findings*.    ASSESSMENT & PLAN:    1. Chest pain - Her chest pain is reproducible with palpation. Similar to prior CABG however less intense. Recently treated with  H.pyroli. Seems typical and atypical features. She hasn't tried nitroglycerine. Currently chest pain free. Advised to double up Nexium.  Will get troponin today. Get Myoview.   2. 2nd degree AV block, Mobitz 1 - Not on AV blocking agent. She feels "skip beats" with dizziness. No syncope. Will get 48 hours monitor to r/o any significant pause.   3. Dyspnea - No sign of volume ovarload. Will get Myoview as above and also get echo given hx of AVR. Also has hx of COPD. No wheezing noted.   4.  CAD: s/p CABG  - As above Will continue ASA. She does not tolerate statins or beta blockers.   5. Aortic valve disease - s/p AVR. As above   6. Hypertension - Not on any medication. States BP usually runs in 120/60s. Today 148/82. Advised to keep log.  7. Hyperlipidemia:  - She is statin intolerant (Crestor, Lipitor, Pravastatin). She did not tolerate Zetia. Cost is very high for PCSK9 inhibitor. LDL 148 11/17/15. Advised lifestyle changes.     Medication Adjustments/Labs and Tests Ordered: Current medicines are reviewed at length with the patient today.  Concerns regarding medicines are outlined above.  Medication changes, Labs and Tests ordered today are listed in the Patient Instructions below. Patient Instructions  Medication Instructions:  Your physician has recommended you make the following change in your medication:  1) START Nitroglycerin 0.4 mg tablet -- take 1 tablet every 5 minutes for chest pain. You may take no more than 3 doses. See  instructions below for correct usage.  Labwork: Troponin today  Testing/Procedures: Your physician has recommended that you wear a holter monitor. Holter monitors are medical devices that record the heart's electrical activity. Doctors most often use these monitors to diagnose arrhythmias. Arrhythmias are problems with the speed or rhythm of the heartbeat. The monitor is a small, portable device. You can wear one while you do your normal daily activities. This is usually used to diagnose what is causing palpitations/syncope (passing out).  Your physician has requested that you have a lexiscan myoview. For further information please visit [email or web address]. Please follow instruction sheet, as given.  Your physician has requested that you have an echocardiogram. Echocardiography is a painless test that uses sound waves to create images of your heart. It provides your doctor with information about the size and shape of your heart and how well your heart's chambers and valves are working. This procedure takes approximately one hour. There are no restrictions for this procedure.  Follow-Up: Your physician recommends that you schedule a follow-up appointment in: 2 weeks with an extender, on a day that Dr. Angelena Form is in the office.  If you need a refill on your cardiac medications before your next appointment, please call your pharmacy.  Thank you for choosing [facility]!!    Any Other Special Instructions Will Be Listed Below (If Applicable). Nitroglycerin sublingual tablets What is this medicine? NITROGLYCERIN (nye troe GLI ser in) is a type of vasodilator. It relaxes blood vessels, increasing the blood and oxygen supply to your heart. This medicine is used to relieve chest pain caused by angina. It is also used to prevent chest pain before activities like climbing stairs, going outdoors in cold weather, or sexual activity. This medicine may be used for other purposes; ask your health care  provider or pharmacist if you have questions. COMMON BRAND NAME(S): Nitroquick, Nitrostat, Nitrotab What should I tell my health care provider before I take this medicine? They need to know if you  have any of these conditions: -anemia -head injury, recent stroke, or bleeding in the brain -liver disease -previous heart attack -an unusual or allergic reaction to nitroglycerin, other medicines, foods, dyes, or preservatives -pregnant or trying to get pregnant -breast-feeding How should I use this medicine? Take this medicine by mouth as needed. At the first sign of an angina attack (chest pain or tightness) place one tablet under your tongue. You can also take this medicine 5 to 10 minutes before an event likely to produce chest pain. Follow the directions on the prescription label. Let the tablet dissolve under the tongue. Do not swallow whole. Replace the dose if you accidentally swallow it. It will help if your mouth is not dry. Saliva around the tablet will help it to dissolve more quickly. Do not eat or drink, smoke or chew tobacco while a tablet is dissolving. If you are not better within 5 minutes after taking ONE dose of nitroglycerin, call 9-1-1 immediately to seek emergency medical care. Do not take more than 3 nitroglycerin tablets over 15 minutes. If you take this medicine often to relieve symptoms of angina, your doctor or health care professional may provide you with different instructions to manage your symptoms. If symptoms do not go away after following these instructions, it is important to call 9-1-1 immediately. Do not take more than 3 nitroglycerin tablets over 15 minutes. Talk to your pediatrician regarding the use of this medicine in children. Special care may be needed. Overdosage: If you think you have taken too much of this medicine contact a poison control center or emergency room at once. NOTE: This medicine is only for you. Do not share this medicine with others. What if I  miss a dose? This does not apply. This medicine is only used as needed. What may interact with this medicine? Do not take this medicine with any of the following medications: -certain migraine medicines like ergotamine and dihydroergotamine (DHE) -medicines used to treat erectile dysfunction like sildenafil, tadalafil, and vardenafil -riociguat This medicine may also interact with the following medications: -alteplase -aspirin -heparin -medicines for high blood pressure -medicines for mental depression -other medicines used to treat angina -phenothiazines like chlorpromazine, mesoridazine, prochlorperazine, thioridazine This list may not describe all possible interactions. Give your health care provider a list of all the medicines, herbs, non-prescription drugs, or dietary supplements you use. Also tell them if you smoke, drink alcohol, or use illegal drugs. Some items may interact with your medicine. What should I watch for while using this medicine? Tell your doctor or health care professional if you feel your medicine is no longer working. Keep this medicine with you at all times. Sit or lie down when you take your medicine to prevent falling if you feel dizzy or faint after using it. Try to remain calm. This will help you to feel better faster. If you feel dizzy, take several deep breaths and lie down with your feet propped up, or bend forward with your head resting between your knees. You may get drowsy or dizzy. Do not drive, use machinery, or do anything that needs mental alertness until you know how this drug affects you. Do not stand or sit up quickly, especially if you are an older patient. This reduces the risk of dizzy or fainting spells. Alcohol can make you more drowsy and dizzy. Avoid alcoholic drinks. Do not treat yourself for coughs, colds, or pain while you are taking this medicine without asking your doctor or health care professional for  advice. Some ingredients may increase  your blood pressure. What side effects may I notice from receiving this medicine? Side effects that you should report to your doctor or health care professional as soon as possible: -blurred vision -dry mouth -skin rash -sweating -the feeling of extreme pressure in the head -unusually weak or tired Side effects that usually do not require medical attention (report to your doctor or health care professional if they continue or are bothersome): -flushing of the face or neck -headache -irregular heartbeat, palpitations -nausea, vomiting This list may not describe all possible side effects. Call your doctor for medical advice about side effects. You may report side effects to FDA at 1-800-FDA-1088. Where should I keep my medicine? Keep out of the reach of children. Store at room temperature between 20 and 25 degrees C (68 and 77 degrees F). Store in [employer]. Protect from light and moisture. Keep tightly closed. Throw away any unused medicine after the expiration date. NOTE: This sheet is a summary. It may not cover all possible information. If you have questions about this medicine, talk to your doctor, pharmacist, or health care provider.  2017 Elsevier/Gold Standard (2013-03-19 17:57:36)   Holter Monitoring Introduction A Holter monitor is a small device that is used to detect abnormal heart rhythms. It clips to your clothing and is connected by wires to flat, sticky disks (electrodes) that attach to your chest. It is worn continuously for 24-48 hours. Follow these instructions at home:  Wear your Holter monitor at all times, even while exercising and sleeping, for as long as directed by your health care provider.  Make sure that the Holter monitor is safely clipped to your clothing or close to your body as recommended by your health care provider.  Do not get the monitor or wires wet.  Do not put body lotion or moisturizer on your chest.  Keep your skin clean.  Keep a  diary of your daily activities, such as walking and doing chores. If you feel that your heartbeat is abnormal or that your heart is fluttering or skipping a beat:  Record what you are doing when it happens.  Record what time of day the symptoms occur.  Return your Holter monitor as directed by your health care provider.  Keep all follow-up visits as directed by your health care provider. This is important. Get help right away if:  You feel lightheaded or you faint.  You have trouble breathing.  You feel pain in your chest, upper arm, or jaw.  You feel sick to your stomach and your skin is pale, cool, or damp.  You heartbeat feels unusual or abnormal. This information is not intended to replace advice given to you by your health care provider. Make sure you discuss any questions you have with your health care provider. Document Released: 02/17/2004 Document Revised: 10/27/2015 Document Reviewed: 12/28/2013  2017 Elsevier  Pharmacologic Stress Electrocardiogram A pharmacologic stress electrocardiogram is a heart (cardiac) test that uses nuclear imaging to evaluate the blood supply to your heart. This test may also be called a pharmacologic stress electrocardiography. Pharmacologic means that a medicine is used to increase your heart rate and blood pressure.  This stress test is done to find areas of poor blood flow to the heart by determining the extent of coronary artery disease (CAD). Some people exercise on a treadmill, which naturally increases the blood flow to the heart. For those people unable to exercise on a treadmill, a medicine is used. This medicine  stimulates your heart and will cause your heart to beat harder and more quickly, as if you were exercising.  Pharmacologic stress tests can help determine:  The adequacy of blood flow to your heart during increased levels of activity in order to clear you for discharge home.  The extent of coronary artery blockage caused by  CAD.  Your prognosis if you have suffered a heart attack.  The effectiveness of cardiac procedures done, such as an angioplasty, which can increase the circulation in your coronary arteries.  Causes of chest pain or pressure. LET [facility] CARE PROVIDER KNOW ABOUT:  Any allergies you have.  All medicines you are taking, including vitamins, herbs, eye drops, creams, and over-the-counter medicines.  Previous problems you or members of your family have had with the use of anesthetics.  Any blood disorders you have.  Previous surgeries you have had.  Medical conditions you have.  Possibility of pregnancy, if this applies.  If you are currently breastfeeding. RISKS AND COMPLICATIONS Generally, this is a safe procedure. However, as with any procedure, complications can occur. Possible complications include:  You develop pain or pressure in the following areas:  Chest.  Jaw or neck.  Between your shoulder blades.  Radiating down your left arm.  Headache.  Dizziness or light-headedness.  Shortness of breath.  Increased or irregular heartbeat.  Low blood pressure.  Nausea or vomiting.  Flushing.  Redness going up the arm and slight pain during injection of medicine.  Heart attack (rare). BEFORE THE PROCEDURE   Avoid all forms of caffeine for 24 hours before your test or as directed by your health care provider. This includes coffee, tea (even decaffeinated tea), caffeinated sodas, chocolate, cocoa, and certain pain medicines.  Follow your health care provider's instructions regarding eating and drinking before the test.  Take your medicines as directed at regular times with water unless instructed otherwise. Exceptions may include:  If you have diabetes, ask how you are to take your insulin or pills. It is common to adjust insulin dosing the morning of the test.  If you are taking beta-blocker medicines, it is important to talk to your health care provider  about these medicines well before the date of your test. Taking beta-blocker medicines may interfere with the test. In some cases, these medicines need to be changed or stopped 24 hours or more before the test.  If you wear a nitroglycerin patch, it may need to be removed prior to the test. Ask your health care provider if the patch should be removed before the test.  If you use an inhaler for any breathing condition, bring it with you to the test.  If you are an outpatient, bring a snack so you can eat right after the stress phase of the test.  Do not smoke for 4 hours prior to the test or as directed by your health care provider.  Do not apply lotions, powders, creams, or oils on your chest prior to the test.  Wear comfortable shoes and clothing. Let your health care provider know if you were unable to complete or follow the preparations for your test. PROCEDURE   Multiple patches (electrodes) will be put on your chest. If needed, small areas of your chest may be shaved to get better contact with the electrodes. Once the electrodes are attached to your body, multiple wires will be attached to the electrodes, and your heart rate will be monitored.  An IV access will be started. A nuclear trace (  isotope) is given. The isotope may be given intravenously, or it may be swallowed. Nuclear refers to several types of radioactive isotopes, and the nuclear isotope lights up the arteries so that the nuclear images are clear. The isotope is absorbed by your body. This results in low radiation exposure.  A resting nuclear image is taken to show how your heart functions at rest.  A medicine is given through the IV access.  A second scan is done about 1 hour after the medicine injection and determines how your heart functions under stress.  During this stress phase, you will be connected to an electrocardiogram machine. Your blood pressure and oxygen levels will be monitored. AFTER THE PROCEDURE    Your heart rate and blood pressure will be monitored after the test.  You may return to your normal schedule, including diet,activities, and medicines, unless your health care provider tells you otherwise. This information is not intended to replace advice given to you by your health care provider. Make sure you discuss any questions you have with your health care provider. Document Released: 10/07/2008 Document Revised: 05/26/2013 Document Reviewed: 01/26/2013 Elsevier Interactive Patient Education  2017 [employer].  Echocardiogram An echocardiogram, or echocardiography, uses sound waves (ultrasound) to produce an image of your heart. The echocardiogram is simple, painless, obtained within a short period of time, and offers valuable information to your health care provider. The images from an echocardiogram can provide information such as:  Evidence of coronary artery disease (CAD).  Heart size.  Heart muscle function.  Heart valve function.  Aneurysm detection.  Evidence of a past heart attack.  Fluid buildup around the heart.  Heart muscle thickening.  Assess heart valve function. Tell a health care provider about:  Any allergies you have.  All medicines you are taking, including vitamins, herbs, eye drops, creams, and over-the-counter medicines.  Any problems you or family members have had with anesthetic medicines.  Any blood disorders you have.  Any surgeries you have had.  Any medical conditions you have.  Whether you are pregnant or may be pregnant. What happens before the procedure? No special preparation is needed. Eat and drink normally. What happens during the procedure?  In order to produce an image of your heart, gel will be applied to your chest and a wand-like tool (transducer) will be moved over your chest. The gel will help transmit the sound waves from the transducer. The sound waves will harmlessly bounce off your heart to allow the heart  images to be captured in real-time motion. These images will then be recorded.  You may need an IV to receive a medicine that improves the quality of the pictures. What happens after the procedure? You may return to your normal schedule including diet, activities, and medicines, unless your health care provider tells you otherwise. This information is not intended to replace advice given to you by your health care provider. Make sure you discuss any questions you have with your health care provider. Document Released: 05/18/2000 Document Revised: 01/07/2016 Document Reviewed: 01/26/2013 Elsevier Interactive Patient Education  2017 [place], [place], [place]  05/16/2016 2:32 PM    [facility] [place], [place], [place]  [address] Phone: [phone]; Fax: [phone]

## 2016-05-17 ENCOUNTER — Ambulatory Visit: Payer: Medicare Other | Admitting: Physician Assistant

## 2016-05-17 LAB — TROPONIN I

## 2016-05-18 DIAGNOSIS — E1165 Type 2 diabetes mellitus with hyperglycemia: Secondary | ICD-10-CM | POA: Diagnosis not present

## 2016-05-18 DIAGNOSIS — Z794 Long term (current) use of insulin: Secondary | ICD-10-CM | POA: Diagnosis not present

## 2016-05-22 ENCOUNTER — Telehealth (HOSPITAL_COMMUNITY): Payer: Self-pay | Admitting: *Deleted

## 2016-05-22 NOTE — Telephone Encounter (Signed)
Patient's husband given detailed instructions per Myocardial Perfusion Study Information Sheet for the test on 05/25/16 at 0815 per DPR. Patient notified to arrive 15 minutes early and that it is imperative to arrive on time for appointment to keep from having the test rescheduled.  If you need to cancel or reschedule your appointment, please call the office within 24 hours of your appointment. Failure to do so may result in a cancellation of your appointment, and a $50 no show fee. Patient verbalized understanding.Idabell Picking, Ranae Palms

## 2016-05-24 ENCOUNTER — Emergency Department (HOSPITAL_COMMUNITY): Payer: Medicare Other

## 2016-05-24 ENCOUNTER — Encounter (HOSPITAL_COMMUNITY): Payer: Self-pay

## 2016-05-24 ENCOUNTER — Inpatient Hospital Stay (HOSPITAL_COMMUNITY)
Admission: EM | Admit: 2016-05-24 | Discharge: 2016-05-26 | DRG: 251 | Disposition: A | Discharge status: Home / Self Care | Payer: Medicare Other | Attending: Internal Medicine | Admitting: Internal Medicine

## 2016-05-24 DIAGNOSIS — Z91013 Allergy to seafood: Secondary | ICD-10-CM

## 2016-05-24 DIAGNOSIS — G4733 Obstructive sleep apnea (adult) (pediatric): Secondary | ICD-10-CM | POA: Diagnosis present

## 2016-05-24 DIAGNOSIS — Z952 Presence of prosthetic heart valve: Secondary | ICD-10-CM

## 2016-05-24 DIAGNOSIS — K5730 Diverticulosis of large intestine without perforation or abscess without bleeding: Secondary | ICD-10-CM | POA: Diagnosis not present

## 2016-05-24 DIAGNOSIS — E785 Hyperlipidemia, unspecified: Secondary | ICD-10-CM | POA: Diagnosis present

## 2016-05-24 DIAGNOSIS — R0602 Shortness of breath: Secondary | ICD-10-CM | POA: Diagnosis present

## 2016-05-24 DIAGNOSIS — J449 Chronic obstructive pulmonary disease, unspecified: Secondary | ICD-10-CM | POA: Diagnosis present

## 2016-05-24 DIAGNOSIS — R002 Palpitations: Secondary | ICD-10-CM | POA: Diagnosis present

## 2016-05-24 DIAGNOSIS — F418 Other specified anxiety disorders: Secondary | ICD-10-CM | POA: Diagnosis present

## 2016-05-24 DIAGNOSIS — D509 Iron deficiency anemia, unspecified: Secondary | ICD-10-CM | POA: Diagnosis present

## 2016-05-24 DIAGNOSIS — Y712 Prosthetic and other implants, materials and accessory cardiovascular devices associated with adverse incidents: Secondary | ICD-10-CM | POA: Diagnosis not present

## 2016-05-24 DIAGNOSIS — Z8711 Personal history of peptic ulcer disease: Secondary | ICD-10-CM | POA: Diagnosis not present

## 2016-05-24 DIAGNOSIS — Z87891 Personal history of nicotine dependence: Secondary | ICD-10-CM | POA: Diagnosis not present

## 2016-05-24 DIAGNOSIS — F329 Major depressive disorder, single episode, unspecified: Secondary | ICD-10-CM | POA: Diagnosis present

## 2016-05-24 DIAGNOSIS — Y831 Surgical operation with implant of artificial internal device as the cause of abnormal reaction of the patient, or of later complication, without mention of misadventure at the time of the procedure: Secondary | ICD-10-CM | POA: Diagnosis present

## 2016-05-24 DIAGNOSIS — Z951 Presence of aortocoronary bypass graft: Secondary | ICD-10-CM

## 2016-05-24 DIAGNOSIS — H9190 Unspecified hearing loss, unspecified ear: Secondary | ICD-10-CM | POA: Diagnosis present

## 2016-05-24 DIAGNOSIS — Z79899 Other long term (current) drug therapy: Secondary | ICD-10-CM

## 2016-05-24 DIAGNOSIS — I441 Atrioventricular block, second degree: Secondary | ICD-10-CM | POA: Diagnosis present

## 2016-05-24 DIAGNOSIS — I25110 Atherosclerotic heart disease of native coronary artery with unstable angina pectoris: Principal | ICD-10-CM | POA: Diagnosis present

## 2016-05-24 DIAGNOSIS — K279 Peptic ulcer, site unspecified, unspecified as acute or chronic, without hemorrhage or perforation: Secondary | ICD-10-CM | POA: Diagnosis present

## 2016-05-24 DIAGNOSIS — F3341 Major depressive disorder, recurrent, in partial remission: Secondary | ICD-10-CM | POA: Diagnosis not present

## 2016-05-24 DIAGNOSIS — Z794 Long term (current) use of insulin: Secondary | ICD-10-CM

## 2016-05-24 DIAGNOSIS — Z953 Presence of xenogenic heart valve: Secondary | ICD-10-CM

## 2016-05-24 DIAGNOSIS — R079 Chest pain, unspecified: Secondary | ICD-10-CM | POA: Diagnosis not present

## 2016-05-24 DIAGNOSIS — I200 Unstable angina: Secondary | ICD-10-CM | POA: Diagnosis present

## 2016-05-24 DIAGNOSIS — H269 Unspecified cataract: Secondary | ICD-10-CM | POA: Diagnosis present

## 2016-05-24 DIAGNOSIS — R0789 Other chest pain: Secondary | ICD-10-CM | POA: Diagnosis not present

## 2016-05-24 DIAGNOSIS — R0600 Dyspnea, unspecified: Secondary | ICD-10-CM | POA: Diagnosis not present

## 2016-05-24 DIAGNOSIS — I10 Essential (primary) hypertension: Secondary | ICD-10-CM | POA: Diagnosis present

## 2016-05-24 DIAGNOSIS — E7800 Pure hypercholesterolemia, unspecified: Secondary | ICD-10-CM | POA: Diagnosis present

## 2016-05-24 DIAGNOSIS — E119 Type 2 diabetes mellitus without complications: Secondary | ICD-10-CM

## 2016-05-24 DIAGNOSIS — G4489 Other headache syndrome: Secondary | ICD-10-CM | POA: Diagnosis not present

## 2016-05-24 DIAGNOSIS — Z885 Allergy status to narcotic agent status: Secondary | ICD-10-CM

## 2016-05-24 DIAGNOSIS — Z8249 Family history of ischemic heart disease and other diseases of the circulatory system: Secondary | ICD-10-CM

## 2016-05-24 DIAGNOSIS — M1990 Unspecified osteoarthritis, unspecified site: Secondary | ICD-10-CM | POA: Diagnosis present

## 2016-05-24 DIAGNOSIS — F419 Anxiety disorder, unspecified: Secondary | ICD-10-CM | POA: Diagnosis present

## 2016-05-24 DIAGNOSIS — I2510 Atherosclerotic heart disease of native coronary artery without angina pectoris: Secondary | ICD-10-CM | POA: Diagnosis not present

## 2016-05-24 DIAGNOSIS — K219 Gastro-esophageal reflux disease without esophagitis: Secondary | ICD-10-CM | POA: Diagnosis present

## 2016-05-24 DIAGNOSIS — T82855D Stenosis of coronary artery stent, subsequent encounter: Secondary | ICD-10-CM | POA: Diagnosis not present

## 2016-05-24 DIAGNOSIS — Z9861 Coronary angioplasty status: Secondary | ICD-10-CM

## 2016-05-24 DIAGNOSIS — Z7982 Long term (current) use of aspirin: Secondary | ICD-10-CM

## 2016-05-24 DIAGNOSIS — I252 Old myocardial infarction: Secondary | ICD-10-CM | POA: Diagnosis not present

## 2016-05-24 DIAGNOSIS — T82855A Stenosis of coronary artery stent, initial encounter: Secondary | ICD-10-CM | POA: Diagnosis present

## 2016-05-24 DIAGNOSIS — G4700 Insomnia, unspecified: Secondary | ICD-10-CM | POA: Diagnosis not present

## 2016-05-24 DIAGNOSIS — E890 Postprocedural hypothyroidism: Secondary | ICD-10-CM | POA: Diagnosis not present

## 2016-05-24 DIAGNOSIS — Z888 Allergy status to other drugs, medicaments and biological substances status: Secondary | ICD-10-CM

## 2016-05-24 DIAGNOSIS — Z91040 Latex allergy status: Secondary | ICD-10-CM

## 2016-05-24 DIAGNOSIS — R072 Precordial pain: Secondary | ICD-10-CM | POA: Diagnosis not present

## 2016-05-24 HISTORY — DX: Unspecified hearing loss, unspecified ear: H91.90

## 2016-05-24 LAB — CBC
HCT: 35.5 % — ABNORMAL LOW (ref 36.0–46.0)
HEMOGLOBIN: 11.6 g/dL — AB (ref 12.0–15.0)
MCH: 27 pg (ref 26.0–34.0)
MCHC: 32.7 g/dL (ref 30.0–36.0)
MCV: 82.6 fL (ref 78.0–100.0)
Platelets: 206 10*3/uL (ref 150–400)
RBC: 4.3 MIL/uL (ref 3.87–5.11)
RDW: 14.8 % (ref 11.5–15.5)
WBC: 11.5 10*3/uL — ABNORMAL HIGH (ref 4.0–10.5)

## 2016-05-24 LAB — I-STAT TROPONIN, ED
TROPONIN I, POC: 0 ng/mL (ref 0.00–0.08)
Troponin i, poc: 0 ng/mL (ref 0.00–0.08)

## 2016-05-24 LAB — BASIC METABOLIC PANEL
ANION GAP: 7 (ref 5–15)
BUN: 12 mg/dL (ref 6–20)
CALCIUM: 8.8 mg/dL — AB (ref 8.9–10.3)
CO2: 26 mmol/L (ref 22–32)
Chloride: 103 mmol/L (ref 101–111)
Creatinine, Ser: 0.85 mg/dL (ref 0.44–1.00)
GFR calc Af Amer: >60 mL/min (ref >60)
GLUCOSE: 209 mg/dL — AB (ref 65–99)
Potassium: 4.2 mmol/L (ref 3.5–5.1)
Sodium: 136 mmol/L (ref 135–145)

## 2016-05-24 MED ORDER — HEPARIN BOLUS VIA INFUSION
4000.0000 [IU] | Freq: Once | INTRAVENOUS | Status: AC
  Administered 2016-05-24: 4000 [IU] via INTRAVENOUS
  Filled 2016-05-24: qty 4000

## 2016-05-24 MED ORDER — NITROGLYCERIN 2 % TD OINT
0.5000 [in_us] | TOPICAL_OINTMENT | Freq: Once | TRANSDERMAL | Status: AC
  Administered 2016-05-25: 0.5 [in_us] via TOPICAL
  Filled 2016-05-24: qty 1

## 2016-05-24 MED ORDER — NITROGLYCERIN 0.4 MG SL SUBL
0.4000 mg | SUBLINGUAL_TABLET | Freq: Once | SUBLINGUAL | Status: AC
  Administered 2016-05-24: 0.4 mg via SUBLINGUAL
  Filled 2016-05-24: qty 1

## 2016-05-24 MED ORDER — ASPIRIN 81 MG PO CHEW
324.0000 mg | CHEWABLE_TABLET | Freq: Once | ORAL | Status: AC
  Administered 2016-05-24: 324 mg via ORAL
  Filled 2016-05-24: qty 4

## 2016-05-24 MED ORDER — HYDROMORPHONE HCL 2 MG/ML IJ SOLN
0.5000 mg | Freq: Once | INTRAMUSCULAR | Status: DC
Start: 2016-05-24 — End: 2016-05-26
  Filled 2016-05-24: qty 1

## 2016-05-24 MED ORDER — HEPARIN (PORCINE) IN NACL 100-0.45 UNIT/ML-% IJ SOLN
1000.0000 [IU]/h | INTRAMUSCULAR | Status: DC
  Administered 2016-05-24: 800 [IU]/h via INTRAVENOUS
  Filled 2016-05-24 (×2): qty 250

## 2016-05-24 NOTE — H&P (Signed)
History & Physical    Patient ID: [name] MRN: [id number], DOB/AGE: 1945-12-15   Admit date: 05/24/2016   Primary Physician: Glendon Axe, MD Primary Cardiologist: see EPIC  Patient Profile    Pt is a 70 yo F w/ a PMH significant for 3vCAD s/p 2vCABG (LIMA->LAD and SVG-RCA), severe AR s/p bioprosthetic AVR, DMII, and COPD p/w acute on chronic CP.  Past Medical History   Past Medical History:  Diagnosis Date  . ACE-inhibitor cough   . Allergic rhinitis   . Anemia    iron deficiency anemia  . Anxiety   . Arthritis   . Asthma   . Cataract   . Chronic depression   . Chronic headache   . COPD (chronic obstructive pulmonary disease) ([place])   . Coronary artery disease    a. s/p CABG x2 (LIMA-LAD and SVG-RCA)  b. NST neg for ischemia 07/24/14  . Diabetes mellitus    type 2  . Diverticulitis of colon   . GERD (gastroesophageal reflux disease)   . H/O aortic valve replacement   . Hearing loss   . History of blood transfusion 2013  . Hypercholesterolemia    intolerance of statins and niaspan  . OSA (obstructive sleep apnea)    mild, intolerant of cpap  . PONV (postoperative nausea and vomiting)   . Thyroid disease     Past Surgical History:  Procedure Laterality Date  . ABDOMINAL HYSTERECTOMY    . ABDOMINAL HYSTERECTOMY W/ PARTIAL VAGINACTOMY    . AORTIC VALVE REPLACEMENT N/A 10/12/2013   Procedure: AORTIC VALVE REPLACEMENT (AVR);  Surgeon: [name], MD;  Location: [place];  Service: Open Heart Surgery;  Laterality: N/A;  . [place]  . BARTHOLIN GLAND CYST EXCISION    . BLADDER SUSPENSION    . BREAST BIOPSY Bilateral 09/11/2000   neg  . BREAST BIOPSY Left 07/24/2010   neg  . BREAST CYST EXCISION  1988   bilateral nonmalignant tumors, x3  . CARDIAC CATHETERIZATION    . CATARACT EXTRACTION W/ INTRAOCULAR LENS  IMPLANT, BILATERAL    . CHOLECYSTECTOMY  2001  . COLECTOMY     lap sigmoid  . COLONOSCOPY  2014   polyps found, 2 clamped off.  .  CORONARY ANGIOPLASTY  10/29/2007   Prox RCA & Mid Cx.  . CORONARY ARTERY BYPASS GRAFT N/A 10/12/2013   Procedure: CORONARY ARTERY BYPASS GRAFT TIMES TWO;  Surgeon: [name], MD;  Location: [place];  Service: Open Heart Surgery;  Laterality: N/A;  . LEFT HEART CATHETERIZATION WITH CORONARY ANGIOGRAM N/A 10/09/2013   Procedure: LEFT HEART CATHETERIZATION WITH CORONARY ANGIOGRAM;  Surgeon: [name], MD;  Location: [facility] CATH LAB;  Service: Cardiovascular;  Laterality: N/A;  . STERNAL WIRES REMOVAL N/A 04/13/2014   Procedure: STERNAL WIRES REMOVAL;  Surgeon: [name], MD;  Location: [facility] OR;  Service: Thoracic;  Laterality: N/A;  . THYROIDECTOMY    . TONSILLECTOMY    . TUBAL LIGATION    . VAGINAL DELIVERY     3    Allergies  Allergies  Allergen Reactions  . Amitriptyline Other (See Comments)    Unknown reaction  . Benadryl [Diphenhydramine] Shortness Of Breath  . Demerol [Meperidine] Other (See Comments)    Unknown reaction  . Gabapentin Other (See Comments)    Unknown reaction  . Loratadine Other (See Comments)    Unknown reaction  . Meperidine Hcl Other (See Comments)    Unknown reaction  . Mirtazapine Other (  See Comments)    Unknown reaction  . Olanzapine Other (See Comments)    Unknown reaction   . Voltaren [Diclofenac Sodium] Shortness Of Breath  . Zetia [Ezetimibe] Other (See Comments)    Weakness in legs, shakiness all over  . Ativan [Lorazepam] Other (See Comments)    Causes double vision at highter than .5 mg dose  . Atorvastatin Other (See Comments)    Muscle aches and weakness  . Budesonide-Formoterol Fumarate Other (See Comments)    Shakiness, tremors  . Bupropion Hcl Other (See Comments)    "cloud over me" depression  . Caffeine Other (See Comments)    jitters  . Codeine Sulfate Other (See Comments)    Makes chest hurt like a heart attack  . Lisinopril Cough  . Metformin Nausea And Vomiting  . Mometasone Furoate Nausea And Vomiting  .  Morphine Sulfate Other (See Comments)    Chest pain like a heart attack  . Other Other (See Comments)    Beta Blockers, reaction shortness of breath  . Oxycodone-Acetaminophen Nausea And Vomiting  . Pioglitazone Other (See Comments)    Cannot take because of risk of bladder cancer  . Propoxyphene N-Acetaminophen Nausea And Vomiting  . Rosuvastatin Other (See Comments)    Muscle aches and weakness  . Shellfish Allergy Diarrhea  . Tramadol Nausea Only  . Venlafaxine Other (See Comments)    Unknown reaction  . Zolpidem Tartrate Other (See Comments)     Jittery, diarrhea  . Latex Rash   History of Present Illness    Pt has longstanding hx of cardiac problems. Has known 3vCAD. Had a DES placed in RCA and mLCx in 10/2007. Admitted [facility] w/ CP and had TTE wnl and Myoview neg for ischemia. Dr. Ida Rogue in [place] ordered LHC which showed 3vCAD. Referred for CABG only but intraop TEE showed mild AS mod-severe AR so patient also had bioprosthetic AVR (21 mm [employer]). Admitted to [facility] 07/2014 w/ CP and Myoview showed no ischemia. Has been on ASA but not statin intolerant BP usually well-controlled though not at last office visit. Discussion of PCSK9 inhibitor at [facility] 05/16/2016. Pt complaining of worsening palpitations/CP at that time. Happening daily. Feels palpitations and has substernal CP. +SOB. Does not radiate. Improves SLN. Woke up with it this AM and has not responded to SLN. Seen in [facility] ER. Vitals notable for HTN. EKG no acute/chronic ischemic changes. 1st set CE neg. Consult cards for admission.  Home Medications    Prior to Admission medications   Medication Sig Start Date End Date Taking? Authorizing Provider  albuterol (PROVENTIL HFA;VENTOLIN HFA) 108 (90 Base) MCG/ACT inhaler Inhale 2 puffs into the lungs 2 (two) times daily at 10 AM and 5 PM. 07/22/15  Yes Flora Lipps, MD  aspirin EC 81 MG tablet Take 81 mg by mouth daily.   Yes Historical Provider, MD    Calcium-Vitamin D (CALTRATE 600 PLUS-VIT D PO) Take 2 tablets by mouth 2 (two) times daily.    Yes Historical Provider, MD  esomeprazole (NEXIUM) 40 MG capsule Take 40 mg by mouth daily at 12 noon.   Yes Historical Provider, MD  FLUoxetine (PROZAC) 40 MG capsule Take 40 mg by mouth 2 (two) times daily. 07/29/15  Yes Historical Provider, MD  Insulin Lispro Prot & Lispro (HUMALOG MIX 75/25 KWIKPEN) (75-25) 100 UNIT/ML Kwikpen Inject 70 Units into the skin 2 (two) times daily.    Yes Historical Provider, MD  levothyroxine (SYNTHROID, LEVOTHROID) 150  MCG tablet Take 150 mcg by mouth daily before breakfast.   Yes Historical Provider, MD  nitroGLYCERIN (NITROSTAT) 0.4 MG SL tablet Place 1 tablet (0.4 mg total) under the tongue every 5 (five) minutes as needed for chest pain. 05/16/16 08/14/16 Yes Bhavinkumar Bhagat, PA  traZODone (DESYREL) 100 MG tablet TAKE TWO TABLETS BY MOUTH AT BEDTIME 08/15/15  Yes Lucille Passy, MD  [facility] DELICA LANCETS [id number] MISC USE TO CHECK BLOOD SUGAR THREE TIMES DAILY 05/17/14   Historical Provider, MD   Family History    Family History  Problem Relation Age of Onset  . Breast cancer Mother 56  . Hypertension Father   . Mesothelioma Father   . Asthma Father   . Stroke Paternal Grandfather   . Heart disease    . Breast cancer Maternal Aunt   . Breast cancer Paternal Aunt    Social History    Social History   Social History  . Marital status: Married    Spouse name: N/A  . Number of children: 3  . Years of education: N/A   Occupational History  . 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 Smoking status: Former Smoker    Packs/day: 0.50    Years: 30.00    Types: Cigarettes    Quit date: 10/02/2013  . Smokeless tobacco: Never Used  . Alcohol use No  . Drug use: No  . Sexual activity: Not on file   Other Topics Concern  . Not on file   Social History Narrative   Does not have Living Will   Desires CPR, would not want prolonged life  support if futile.    Review of Systems    General:  No chills, fever, night sweats or weight changes.  Cardiovascular:  No chest pain, dyspnea on exertion, edema, orthopnea, palpitations, paroxysmal nocturnal dyspnea. Dermatological: No rash, lesions/masses Respiratory: No cough, dyspnea Urologic: No hematuria, dysuria Abdominal:   No nausea, vomiting, diarrhea, bright red blood per rectum, melena, or hematemesis Neurologic:  No visual changes, wkns, changes in mental status. All other systems reviewed and are otherwise negative except as noted above.  Physical Exam    Blood pressure 145/75, pulse 72, temperature 98.3 F (36.8 C), temperature source Oral, resp. rate 17, height 5\' 6"  (1.676 m), weight 198 lb (89.8 kg), SpO2 99 %.  General: Pleasant, NAD Psych: Normal affect. Neuro: Alert and oriented X 3. Moves all extremities spontaneously. HEENT: Normal  Neck: Supple without bruits or JVD. Lungs:  Resp regular and unlabored, CTA. Heart: RRR no s3, s4, III/VI systolic murmur radiating to clavicles Abdomen: Soft, non-tender, non-distended, BS + x 4.  Extremities: No clubbing, cyanosis or edema. DP/PT/Radials 2+ and equal bilaterally.  Labs    Troponin [facility] of Care Test)  Recent Labs  05/24/16 2047  TROPIPOC 0.00   No results for input(s): CKTOTAL, CKMB, TROPONINI in the last 72 hours. Lab Results  Component Value Date   WBC 11.5 (H) 05/24/2016   HGB 11.6 (L) 05/24/2016   HCT 35.5 (L) 05/24/2016   MCV 82.6 05/24/2016   PLT 206 05/24/2016    Recent Labs Lab 05/24/16 2023  NA 136  K 4.2  CL 103  CO2 26  BUN 12  CREATININE 0.85  CALCIUM 8.8*  GLUCOSE 209*   Lab Results  Component Value Date   CHOL 206 (H) 03/16/2015   HDL 21 (L) 03/16/2015   LDLCALC 111 03/16/2015   TRIG 369 (H) 03/16/2015  Lab Results  Component Value Date   DDIMER 2.54 (H) 12/03/2013   Radiology Studies    Dg Chest 2 View  Result Date: 05/24/2016 CLINICAL DATA:  Chest pain for  2 weeks, worsening. EXAM: CHEST  2 VIEW COMPARISON:  Chest CT 07/19/2015, radiographs 06/08/2015 FINDINGS: Patient is post median sternotomy and aortic valve replacement. Stable heart size and mediastinal contours from prior exam. Mild increased interstitial opacities are chronic. There is no evidence of pulmonary edema. No focal airspace disease or pleural fluid. No pneumothorax. No acute osseous abnormality. IMPRESSION: No acute abnormality or change from prior exams. Post aortic valve replacement. Electronically Signed   By: Jeb Levering M.D.   On: 05/24/2016 21:16   ECG & Cardiac Imaging    EKG - NSR, 1st degree AV block  Assessment & Plan  Pt is a 70 yo F w/ a PMH significant for 3vCAD s/p 2vCABG (LIMA->LAD and SVG-RCA), severe AR s/p bioprosthetic AVR, DMII, and COPD p/w acute on chronic CP.  #CAD: Known 3vCAD. No LHC since 2vCABG (i.e. LIMA->LAD, SVG->RCA) in 2015. Has had negative Myoview. Would describe symptoms as atypical but very distressing to patient. Believe pharmacologic stress nuclear as she is scheduled would not change management given high pre-test probability. Recommend LHC to further evaluate symptoms. -admit to tele -serial CE, EKG -lipid panel, HgbA1c, TFTs -echo -NPO after midnight for LHC -start low-dose metoprolol -continue ASA, start heparin -need to check insurance for PCSK9 given statin/ezetimibe intolerance -Holter at discharge for palpitations  #Bioprosthetic AVR -check TTE tomorrow  #DMII -continue home meds  #COPD -continue home meds  #FEN/GI -NPO after midnight -PIVB, replete lytes PRN  #PPx -on heparin gtt  #Dispo -pending w/u and eval  Signed, Bari Edward, MD 05/24/2016, 10:40 PM

## 2016-05-24 NOTE — ED Notes (Signed)
Patient transported to X-ray 

## 2016-05-24 NOTE — ED Triage Notes (Addendum)
Pt endorses intermittent nonradiating left/central chest pain with palpitations. Pt also endorses shob, dizziness, and nausea. Pt has hx of 2 bypasses and aortic valve replacement x 3 years ago. Pt alert and oriented x 4. Pt also scheduled for myocardial perfusion study tomorrow but states "i can't wait until tomorrow" Pt reported taking 2 NTG pta without relief.

## 2016-05-24 NOTE — ED Notes (Signed)
Dr Nyoka Lint in to see patient

## 2016-05-24 NOTE — Progress Notes (Signed)
ANTICOAGULATION CONSULT NOTE - Initial Consult  Pharmacy Consult for Heparin  Indication: chest pain/ACS  Patient Measurements: Height: 5\' 6"  (167.6 cm) Weight: 198 lb (89.8 kg) IBW/kg (Calculated) : 59.3  Vital Signs: Temp: 98.3 F (36.8 C) (12/21 2021) Temp Source: Oral (12/21 2021) BP: 145/75 (12/21 2115) Pulse Rate: 72 (12/21 2115)  Labs:  Recent Labs  05/24/16 2023  HGB 11.6*  HCT 35.5*  PLT 206  CREATININE 0.85    Estimated Creatinine Clearance: 68.5 mL/min (by C-G formula based on SCr of 0.85 mg/dL).   Medical History: Past Medical History:  Diagnosis Date  . ACE-inhibitor cough   . Allergic rhinitis   . Anemia    iron deficiency anemia  . Anxiety   . Arthritis   . Asthma   . Cataract   . Chronic depression   . Chronic headache   . COPD (chronic obstructive pulmonary disease) ([place])   . Coronary artery disease    a. s/p CABG x2 (LIMA-LAD and SVG-RCA)  b. NST neg for ischemia 07/24/14  . Diabetes mellitus    type 2  . Diverticulitis of colon   . GERD (gastroesophageal reflux disease)   . H/O aortic valve replacement   . Hearing loss   . History of blood transfusion 2013  . Hypercholesterolemia    intolerance of statins and niaspan  . OSA (obstructive sleep apnea)    mild, intolerant of cpap  . PONV (postoperative nausea and vomiting)   . Thyroid disease     Assessment: 70 y/o F to start heparin for CP, troponin currently negative, Hgb 11.6, PTA meds reviewed.   Goal of Therapy:  Heparin level 0.3-0.7 units/ml Monitor platelets by anticoagulation protocol: Yes   Plan:  Heparin 4000 units BOLUS Start heparin drip at 800 units/hr 0700 HL Daily CBC/HL Monitor for bleeding   Narda Bonds 05/24/2016,10:47 PM

## 2016-05-24 NOTE — ED Notes (Signed)
Patient sitting up in the bed.  No complaints voiced at this time

## 2016-05-24 NOTE — ED Notes (Signed)
Back from xray

## 2016-05-24 NOTE — ED Provider Notes (Signed)
[facility] DEPT Provider Note   CSN: [id number] Arrival date & time: 05/24/16  2006     History   Chief Complaint Chief Complaint  Patient presents with  . Chest Pain    HPI [name] is a 70 y.o. female.  The history is provided by the patient and the spouse.  Chest Pain   This is a new problem. The current episode started 3 to 5 hours ago. The problem occurs constantly. The problem has been gradually improving. Associated with: palpitations. The pain is present in the substernal region. The pain is moderate. The quality of the pain is described as pressure-like (tingling). The pain does not radiate. Associated symptoms include palpitations and shortness of breath. Pertinent negatives include no abdominal pain, no back pain, no cough, no diaphoresis, no fever, no headaches, no leg pain, no lower extremity edema, no near-syncope, no syncope and no vomiting. She has tried nitroglycerin for the symptoms. The treatment provided no relief.  Her past medical history is significant for CAD, COPD and diabetes.  Pertinent negatives for past medical history include no seizures.    Past Medical History:  Diagnosis Date  . ACE-inhibitor cough   . Allergic rhinitis   . Anemia    iron deficiency anemia  . Anxiety   . Arthritis   . Asthma   . Cataract   . Chronic depression   . Chronic headache   . COPD (chronic obstructive pulmonary disease) ([place])   . Coronary artery disease    a. s/p CABG x2 (LIMA-LAD and SVG-RCA)  b. NST neg for ischemia 07/24/14  . Diabetes mellitus    type 2  . Diverticulitis of colon   . GERD (gastroesophageal reflux disease)   . H/O aortic valve replacement   . Hearing loss   . History of blood transfusion 2013  . Hypercholesterolemia    intolerance of statins and niaspan  . OSA (obstructive sleep apnea)    mild, intolerant of cpap  . PONV (postoperative nausea and vomiting)   . Thyroid disease     Patient Active Problem List   Diagnosis Date  Noted  . Recurrent major depressive disorder, in partial remission ([place]) 07/29/2015  . Uncontrolled type 2 diabetes mellitus with hyperglycemia, with long-term current use of insulin ([place]) 07/06/2015  . Parotiditis 04/25/2015  . Viral URI 03/24/2015  . Hypoglycemia 08/25/2014  . Nausea without vomiting 08/25/2014  . Pure hypercholesterolemia   . OSA (obstructive sleep apnea)   . Diabetes mellitus ([place])   . H/O aortic valve replacement   . Long-term insulin use ([place]) 03/31/2014  . Chest pain 01/19/2014  . Aortic valve disorder 11/11/2013  . S/P AVR (aortic valve replacement) 10/12/2013  . Coronary Artery Disease s/p CABG 10/2013 10/10/2013  . Other malaise and fatigue 09/11/2013  . Alopecia 03/06/2013  . Insomnia 02/06/2013  . Gastroesophageal reflux disease without esophagitis 01/19/2013  . Dysphagia, unspecified(787.20) 01/19/2013  . Dyspnea 11/17/2012  . Weakness 11/17/2012  . Abnormal MRI of head 09/05/2012  . Depression with anxiety 03/21/2012  . Extrinsic asthma 11/12/2011  . Anemia 06/07/2011  . Chronic obstructive pulmonary disease ([place]) 01/25/2010  . Acquired hypothyroidism 08/10/2009  . MICROSCOPIC HEMATURIA 05/07/2009  . GOITER 03/02/2009  . Hypertension 01/06/2009  . MEMORY LOSS 06/23/2008  . OBSTRUCTIVE SLEEP APNEA- C pap intol 12/25/2007  . Diabetes ([place]) 09/09/2006  . ALLERGIC RHINITIS 09/09/2006  . GERD 09/09/2006  . DIVERTICULOSIS, COLON 09/09/2006    Past Surgical History:  Procedure Laterality Date  .  ABDOMINAL HYSTERECTOMY    . ABDOMINAL HYSTERECTOMY W/ PARTIAL VAGINACTOMY    . AORTIC VALVE REPLACEMENT N/A 10/12/2013   Procedure: AORTIC VALVE REPLACEMENT (AVR);  Surgeon: [name], MD;  Location: [facility];  Service: Open Heart Surgery;  Laterality: N/A;  . [place]  . BARTHOLIN GLAND CYST EXCISION    . BLADDER SUSPENSION    . BREAST BIOPSY Bilateral 09/11/2000   neg  . BREAST BIOPSY Left 07/24/2010   neg  . BREAST CYST EXCISION  1988    bilateral nonmalignant tumors, x3  . CARDIAC CATHETERIZATION    . CATARACT EXTRACTION W/ INTRAOCULAR LENS  IMPLANT, BILATERAL    . CHOLECYSTECTOMY  2001  . COLECTOMY     lap sigmoid  . COLONOSCOPY  2014   polyps found, 2 clamped off.  . CORONARY ANGIOPLASTY  10/29/2007   Prox RCA & Mid Cx.  . CORONARY ARTERY BYPASS GRAFT N/A 10/12/2013   Procedure: CORONARY ARTERY BYPASS GRAFT TIMES TWO;  Surgeon: [name], MD;  Location: [facility];  Service: Open Heart Surgery;  Laterality: N/A;  . LEFT HEART CATHETERIZATION WITH CORONARY ANGIOGRAM N/A 10/09/2013   Procedure: LEFT HEART CATHETERIZATION WITH CORONARY ANGIOGRAM;  Surgeon: [name], MD;  Location: [facility] CATH LAB;  Service: Cardiovascular;  Laterality: N/A;  . STERNAL WIRES REMOVAL N/A 04/13/2014   Procedure: STERNAL WIRES REMOVAL;  Surgeon: [name], MD;  Location: [facility] OR;  Service: Thoracic;  Laterality: N/A;  . THYROIDECTOMY    . TONSILLECTOMY    . TUBAL LIGATION    . VAGINAL DELIVERY     3    OB History    Gravida Para Term Preterm AB Living   3 3       3    SAB TAB Ectopic Multiple Live Births                  Obstetric Comments   1st Menstrual Cycle: 10 1st Pregnancy: 19       Home Medications    Prior to Admission medications   Medication Sig Start Date End Date Taking? Authorizing Provider  albuterol (PROVENTIL HFA;VENTOLIN HFA) 108 (90 Base) MCG/ACT inhaler Inhale 2 puffs into the lungs 2 (two) times daily at 10 AM and 5 PM. 07/22/15   Flora Lipps, MD  aspirin EC 81 MG tablet Take 81 mg by mouth daily.    Historical Provider, MD  Calcium-Vitamin D (CALTRATE 600 PLUS-VIT D PO) Take 2 tablets by mouth 2 (two) times daily.     Historical Provider, MD  FLUoxetine (PROZAC) 40 MG capsule Take 40 mg by mouth 2 (two) times daily. 07/29/15   Historical Provider, MD  Insulin Lispro Prot & Lispro (HUMALOG MIX 75/25 KWIKPEN) (75-25) 100 UNIT/ML Kwikpen Inject 60 Units into the skin 2 (two) times daily.    Historical  Provider, MD  levothyroxine (SYNTHROID, LEVOTHROID) 150 MCG tablet Take 150 mcg by mouth daily before breakfast.    Historical Provider, MD  nitroGLYCERIN (NITROSTAT) 0.4 MG SL tablet Place 1 tablet (0.4 mg total) under the tongue every 5 (five) minutes as needed for chest pain. 05/16/16 08/14/16  Bhavinkumar Bhagat, PA  ONETOUCH DELICA LANCETS [id number] MISC USE TO CHECK BLOOD SUGAR THREE TIMES DAILY 05/17/14   Historical Provider, MD  traZODone (DESYREL) 100 MG tablet TAKE TWO TABLETS BY MOUTH AT BEDTIME 08/15/15   Lucille Passy, MD    Family History Family History  Problem Relation Age of Onset  . Breast cancer  Mother 1  . Hypertension Father   . Mesothelioma Father   . Asthma Father   . Stroke Paternal Grandfather   . Heart disease    . Breast cancer Maternal Aunt   . Breast cancer Paternal Aunt     Social History Social History  Substance Use Topics  . Smoking status: Former Smoker    Packs/day: 0.50    Years: 30.00    Types: Cigarettes    Quit date: 10/02/2013  . Smokeless tobacco: Never Used  . Alcohol use No     Allergies   Amitriptyline; Benadryl [diphenhydramine]; Demerol [meperidine]; Gabapentin; Loratadine; Meperidine hcl; Mirtazapine; Olanzapine; Voltaren [diclofenac sodium]; Zetia [ezetimibe]; Ativan [lorazepam]; Atorvastatin; Budesonide-formoterol fumarate; Bupropion hcl; Caffeine; Codeine sulfate; Lisinopril; Metformin; Mometasone furoate; Morphine sulfate; Other; Oxycodone-acetaminophen; Pioglitazone; Propoxyphene n-acetaminophen; Rosuvastatin; Shellfish allergy; Tramadol; Venlafaxine; Zolpidem tartrate; and Latex   Review of Systems Review of Systems  Constitutional: Negative for diaphoresis and fever.  Respiratory: Positive for shortness of breath. Negative for cough and chest tightness.   Cardiovascular: Positive for chest pain and palpitations. Negative for leg swelling, syncope and near-syncope.  Gastrointestinal: Negative for abdominal pain and vomiting.    Musculoskeletal: Negative for back pain.  Neurological: Negative for seizures, syncope, light-headedness and headaches.  All other systems reviewed and are negative.    Physical Exam Updated Vital Signs BP 145/75   Pulse 72   Temp 98.3 F (36.8 C) (Oral)   Resp 17   Ht 5\' 6"  (1.676 m)   Wt 89.8 kg   SpO2 99%   BMI 31.96 kg/m   Physical Exam  Constitutional: She appears well-developed and well-nourished. No distress.  HENT:  Head: Normocephalic and atraumatic.  Mouth/Throat: Oropharynx is clear and moist.  Eyes: Conjunctivae are normal.  Neck: Neck supple.  Cardiovascular: Normal rate, regular rhythm and intact distal pulses.   Murmur heard.  Systolic murmur is present with a grade of 3/6  Pulmonary/Chest: Effort normal. No respiratory distress. She has wheezes (trace end expiratory wheeze). She has no rales.  Abdominal: Soft. There is no tenderness.  Musculoskeletal: She exhibits no edema or tenderness (no calf tenderness).  Neurological: She is alert.  Skin: Skin is warm and dry.  Psychiatric: She has a normal mood and affect.  Nursing note and vitals reviewed.    ED Treatments / Results  Labs (all labs ordered are listed, but only abnormal results are displayed) Labs Reviewed  BASIC METABOLIC PANEL - Abnormal; Notable for the following:       Result Value   Glucose, Bld 209 (*)    Calcium 8.8 (*)    All other components within normal limits  CBC - Abnormal; Notable for the following:    WBC 11.5 (*)    Hemoglobin 11.6 (*)    HCT 35.5 (*)    All other components within normal limits  HEPARIN LEVEL (UNFRACTIONATED)  Randolm Idol, ED  Randolm Idol, ED    EKG  EKG Interpretation  Date/Time:  Thursday May 24 2016 20:15:43 EST Ventricular Rate:  76 PR Interval:  270 QRS Duration: 100 QT Interval:  428 QTC Calculation: 481 R Axis:   31 Text Interpretation:  Sinus rhythm with 1st degree A-V block Confirmed by Ashok Cordia  MD, Lennette Bihari ([id number]) on  05/24/2016 10:30:53 PM       Radiology Dg Chest 2 View  Result Date: 05/24/2016 CLINICAL DATA:  Chest pain for 2 weeks, worsening. EXAM: CHEST  2 VIEW COMPARISON:  Chest CT 07/19/2015, radiographs 06/08/2015 FINDINGS: Patient is  post median sternotomy and aortic valve replacement. Stable heart size and mediastinal contours from prior exam. Mild increased interstitial opacities are chronic. There is no evidence of pulmonary edema. No focal airspace disease or pleural fluid. No pneumothorax. No acute osseous abnormality. IMPRESSION: No acute abnormality or change from prior exams. Post aortic valve replacement. Electronically Signed   By: Jeb Levering M.D.   On: 05/24/2016 21:16    Procedures Procedures (including critical care time)  Medications Ordered in ED Medications  HYDROmorphone (DILAUDID) injection 0.5 mg (0.5 mg Intravenous Not Given 05/24/16 2302)  heparin ADULT infusion 100 units/mL (25000 units/269mL sodium chloride 0.45%) (800 Units/hr Intravenous New Bag/Given 05/24/16 2325)  nitroGLYCERIN (NITROSTAT) SL tablet 0.4 mg (0.4 mg Sublingual Given 05/24/16 2126)  aspirin chewable tablet 324 mg (324 mg Oral Given 05/24/16 2300)  heparin bolus via infusion 4,000 Units (4,000 Units Intravenous Bolus from Bag 05/24/16 2327)     Initial Impression / Assessment and Plan / ED Course  I have reviewed the triage vital signs and the nursing notes.  Pertinent labs & imaging results that were available during my care of the patient were reviewed by me and considered in my medical decision making (see chart for details).  Clinical Course    Patient is a 70 year old female with history of known CAD status post stents and CABG, history of aortic valve replacement, hypertension, diabetes, COPD who presents with worsening chest pain. Patient has had intermittent chest pain over the last few weeks however it has been resolving with nitroglycerin. Tonight patient had recurrence of chest pain  however after 2 doses nitroglycerin glycerin patient's chest pain continued along with intermittent palpitations.  Patient states pain is improved given she no longer feels the palpitations. Patient was scheduled for a nuclear medicine stress test tomorrow. Patient still has mild chest pain. Patient given additional dose of nitroglycerin here.  EKG shows first-degree AV block but otherwise sinus rhythm, normal rate, no ST or T-wave ischemic changes. First troponin is negative. Labs otherwise unremarkable except for glucose of 209.  Cardiology consulted. After cardiology evaluated the patient they recommend admission for further monitoring. Cardiology requested the patient go ahead and be loaded with aspirin and started on heparin. Patient also given dose of Dilaudid for pain. Cardiology considering not during the stress test in the morning given she still having active symptoms and she may get a repeat heart cath tomorrow.  Patient seen with attending, Dr. Ashok Cordia.  Final Clinical Impressions(s) / ED Diagnoses   Final diagnoses:  Chest pain, unspecified type    New Prescriptions New Prescriptions   No medications on file     Tobie Poet, DO 05/24/16 [place], MD 05/29/16 1244

## 2016-05-24 NOTE — ED Notes (Addendum)
Patient states she has been feeling like her heart is skipping a beat off and on for the past 2 weeks.  Does have tests scheduled for tomorrow but stated she could not wait.  States when her heart skips a beat she hurts down the center of her chest and it feels like something is "jerking"  States she did have nausea last week but has not noticed any the last couple of days.  Patient stated she took 2 NTG at home and "they usually work" but they did not help her much tonight.

## 2016-05-25 ENCOUNTER — Inpatient Hospital Stay (HOSPITAL_COMMUNITY): Payer: Medicare Other

## 2016-05-25 ENCOUNTER — Other Ambulatory Visit (HOSPITAL_COMMUNITY): Payer: Medicare Other

## 2016-05-25 ENCOUNTER — Encounter (HOSPITAL_COMMUNITY)
Admission: EM | Disposition: A | Discharge status: Home / Self Care | Payer: Self-pay | Source: Home / Self Care | Attending: Internal Medicine

## 2016-05-25 ENCOUNTER — Encounter (HOSPITAL_COMMUNITY): Payer: Medicare Other

## 2016-05-25 ENCOUNTER — Encounter (HOSPITAL_COMMUNITY): Payer: Self-pay | Admitting: *Deleted

## 2016-05-25 ENCOUNTER — Inpatient Hospital Stay: Payer: Medicare Other

## 2016-05-25 DIAGNOSIS — E785 Hyperlipidemia, unspecified: Secondary | ICD-10-CM

## 2016-05-25 DIAGNOSIS — R002 Palpitations: Secondary | ICD-10-CM | POA: Diagnosis present

## 2016-05-25 DIAGNOSIS — R079 Chest pain, unspecified: Secondary | ICD-10-CM

## 2016-05-25 DIAGNOSIS — I200 Unstable angina: Secondary | ICD-10-CM | POA: Diagnosis present

## 2016-05-25 DIAGNOSIS — K279 Peptic ulcer, site unspecified, unspecified as acute or chronic, without hemorrhage or perforation: Secondary | ICD-10-CM | POA: Diagnosis present

## 2016-05-25 DIAGNOSIS — T82855A Stenosis of coronary artery stent, initial encounter: Secondary | ICD-10-CM | POA: Diagnosis present

## 2016-05-25 DIAGNOSIS — I2510 Atherosclerotic heart disease of native coronary artery without angina pectoris: Secondary | ICD-10-CM

## 2016-05-25 HISTORY — PX: CARDIAC CATHETERIZATION: SHX172

## 2016-05-25 LAB — HEPARIN LEVEL (UNFRACTIONATED): Heparin Unfractionated: <0.1 IU/mL — ABNORMAL LOW (ref 0.30–0.70)

## 2016-05-25 LAB — CK TOTAL AND CKMB (NOT AT ARMC)
CK TOTAL: 61 U/L (ref 38–234)
CK TOTAL: 56 U/L (ref 38–234)
CK, MB: 2.1 ng/mL (ref 0.5–5.0)
CK, MB: 1.6 ng/mL (ref 0.5–5.0)
CK, MB: 1.3 ng/mL (ref 0.5–5.0)
CK, MB: 1.7 ng/mL (ref 0.5–5.0)
RELATIVE INDEX: INVALID (ref 0.0–2.5)
RELATIVE INDEX: INVALID (ref 0.0–2.5)
Relative Index: INVALID (ref 0.0–2.5)
Relative Index: INVALID (ref 0.0–2.5)
Total CK: 65 U/L (ref 38–234)
Total CK: 64 U/L (ref 38–234)

## 2016-05-25 LAB — BASIC METABOLIC PANEL
ANION GAP: 9 (ref 5–15)
BUN: 10 mg/dL (ref 6–20)
CALCIUM: 8.9 mg/dL (ref 8.9–10.3)
CO2: 26 mmol/L (ref 22–32)
Chloride: 103 mmol/L (ref 101–111)
Creatinine, Ser: 0.88 mg/dL (ref 0.44–1.00)
GFR calc non Af Amer: >60 mL/min (ref >60)
Glucose, Bld: 107 mg/dL — ABNORMAL HIGH (ref 65–99)
POTASSIUM: 4.2 mmol/L (ref 3.5–5.1)
Sodium: 138 mmol/L (ref 135–145)

## 2016-05-25 LAB — CBC WITH DIFFERENTIAL/PLATELET
Basophils Absolute: 0 10*3/uL (ref 0.0–0.1)
Basophils Relative: 0 %
EOS ABS: 0.2 10*3/uL (ref 0.0–0.7)
EOS PCT: 2 %
HCT: 35.8 % — ABNORMAL LOW (ref 36.0–46.0)
Hemoglobin: 11.4 g/dL — ABNORMAL LOW (ref 12.0–15.0)
LYMPHS ABS: 1.8 10*3/uL (ref 0.7–4.0)
Lymphocytes Relative: 20 %
MCH: 26.3 pg (ref 26.0–34.0)
MCHC: 31.8 g/dL (ref 30.0–36.0)
MCV: 82.7 fL (ref 78.0–100.0)
MONOS PCT: 8 %
Monocytes Absolute: 0.7 10*3/uL (ref 0.1–1.0)
Neutro Abs: 6.3 10*3/uL (ref 1.7–7.7)
Neutrophils Relative %: 70 %
PLATELETS: 210 10*3/uL (ref 150–400)
RBC: 4.33 MIL/uL (ref 3.87–5.11)
RDW: 14.8 % (ref 11.5–15.5)
WBC: 9.1 10*3/uL (ref 4.0–10.5)

## 2016-05-25 LAB — POCT ACTIVATED CLOTTING TIME
Activated Clotting Time: 241 seconds
Activated Clotting Time: 334 seconds

## 2016-05-25 LAB — PROTIME-INR
INR: 1.12
PROTHROMBIN TIME: 14.4 s (ref 11.4–15.2)

## 2016-05-25 LAB — T4, FREE: FREE T4: 0.94 ng/dL (ref 0.61–1.12)

## 2016-05-25 LAB — TSH: TSH: 1.873 u[IU]/mL (ref 0.350–4.500)

## 2016-05-25 LAB — GLUCOSE, CAPILLARY
GLUCOSE-CAPILLARY: 157 mg/dL — AB (ref 65–99)
Glucose-Capillary: 123 mg/dL — ABNORMAL HIGH (ref 65–99)

## 2016-05-25 LAB — ECHOCARDIOGRAM COMPLETE
Height: 66 in
Weight: 3118.4 oz

## 2016-05-25 LAB — TROPONIN I
Troponin I: <0.03 ng/mL (ref <0.03)
Troponin I: <0.03 ng/mL (ref <0.03)
Troponin I: <0.03 ng/mL (ref <0.03)

## 2016-05-25 SURGERY — CORONARY BALLOON ANGIOPLASTY

## 2016-05-25 MED ORDER — SODIUM CHLORIDE 0.9 % WEIGHT BASED INFUSION: 1.0000 mL/kg/h | INTRAVENOUS | Status: DC

## 2016-05-25 MED ORDER — HEPARIN BOLUS VIA INFUSION
2000.0000 [IU] | Freq: Once | INTRAVENOUS | Status: AC
  Administered 2016-05-25: 2000 [IU] via INTRAVENOUS
  Filled 2016-05-25: qty 2000

## 2016-05-25 MED ORDER — VERAPAMIL HCL 2.5 MG/ML IV SOLN
INTRAVENOUS | Status: DC | PRN
  Administered 2016-05-25: 10 mL via INTRA_ARTERIAL

## 2016-05-25 MED ORDER — HEPARIN (PORCINE) IN NACL 2-0.9 UNIT/ML-% IJ SOLN
INTRAMUSCULAR | Status: AC
  Filled 2016-05-25: qty 1000

## 2016-05-25 MED ORDER — ASPIRIN 81 MG PO CHEW: 81.0000 mg | CHEWABLE_TABLET | ORAL | Status: DC

## 2016-05-25 MED ORDER — MIDAZOLAM HCL 2 MG/2ML IJ SOLN
INTRAMUSCULAR | Status: AC
  Filled 2016-05-25: qty 2

## 2016-05-25 MED ORDER — IOPAMIDOL (ISOVUE-370) INJECTION 76%
INTRAVENOUS | Status: AC
  Filled 2016-05-25: qty 100

## 2016-05-25 MED ORDER — SODIUM CHLORIDE 0.9 % IV SOLN: 250.0000 mL | INTRAVENOUS | Status: DC | PRN

## 2016-05-25 MED ORDER — VERAPAMIL HCL 2.5 MG/ML IV SOLN
INTRAVENOUS | Status: AC
  Filled 2016-05-25: qty 2

## 2016-05-25 MED ORDER — NITROGLYCERIN IN D5W 200-5 MCG/ML-% IV SOLN
INTRAVENOUS | Status: AC
  Filled 2016-05-25: qty 250

## 2016-05-25 MED ORDER — IOPAMIDOL (ISOVUE-370) INJECTION 76%
INTRAVENOUS | Status: DC | PRN
  Administered 2016-05-25: 210 mL via INTRA_ARTERIAL

## 2016-05-25 MED ORDER — HYDRALAZINE HCL 20 MG/ML IJ SOLN: 5.0000 mg | INTRAMUSCULAR | Status: AC | PRN

## 2016-05-25 MED ORDER — LEVOTHYROXINE SODIUM 75 MCG PO TABS
150.0000 ug | ORAL_TABLET | Freq: Every day | ORAL | Status: DC
  Administered 2016-05-25 – 2016-05-26 (×2): 150 ug via ORAL
  Filled 2016-05-25 (×2): qty 2

## 2016-05-25 MED ORDER — FENTANYL CITRATE (PF) 100 MCG/2ML IJ SOLN
12.5000 ug | INTRAMUSCULAR | Status: DC | PRN
  Administered 2016-05-25: 19:00:00 25 ug via INTRAVENOUS
  Filled 2016-05-25: qty 2

## 2016-05-25 MED ORDER — SODIUM CHLORIDE 0.9% FLUSH: 3.0000 mL | INTRAVENOUS | Status: DC | PRN

## 2016-05-25 MED ORDER — FLUOXETINE HCL 20 MG PO CAPS
40.0000 mg | ORAL_CAPSULE | Freq: Two times a day (BID) | ORAL | Status: DC
  Administered 2016-05-25 – 2016-05-26 (×2): 40 mg via ORAL
  Filled 2016-05-25 (×4): qty 2

## 2016-05-25 MED ORDER — NITROGLYCERIN IN D5W 200-5 MCG/ML-% IV SOLN
INTRAVENOUS | Status: DC | PRN
  Administered 2016-05-25: 10 ug/min via INTRAVENOUS

## 2016-05-25 MED ORDER — FENTANYL CITRATE (PF) 100 MCG/2ML IJ SOLN
INTRAMUSCULAR | Status: DC | PRN
  Administered 2016-05-25: 25 ug via INTRAVENOUS
  Administered 2016-05-25: 50 ug via INTRAVENOUS
  Administered 2016-05-25: 25 ug via INTRAVENOUS

## 2016-05-25 MED ORDER — CLOPIDOGREL BISULFATE 300 MG PO TABS
ORAL_TABLET | ORAL | Status: AC
  Filled 2016-05-25: qty 2

## 2016-05-25 MED ORDER — ANGIOPLASTY BOOK
Freq: Once | Status: AC
  Administered 2016-05-25: 22:00:00
  Filled 2016-05-25: qty 1

## 2016-05-25 MED ORDER — SODIUM CHLORIDE 0.9% FLUSH: 3.0000 mL | Freq: Two times a day (BID) | INTRAVENOUS | Status: DC

## 2016-05-25 MED ORDER — ASPIRIN EC 81 MG PO TBEC: 81.0000 mg | DELAYED_RELEASE_TABLET | Freq: Every day | ORAL | Status: DC

## 2016-05-25 MED ORDER — ALBUTEROL SULFATE (2.5 MG/3ML) 0.083% IN NEBU
2.5000 mg | INHALATION_SOLUTION | Freq: Two times a day (BID) | RESPIRATORY_TRACT | Status: DC
  Filled 2016-05-25: qty 3

## 2016-05-25 MED ORDER — NITROGLYCERIN 0.4 MG SL SUBL: 0.4000 mg | SUBLINGUAL_TABLET | SUBLINGUAL | Status: DC | PRN

## 2016-05-25 MED ORDER — NITROGLYCERIN IN D5W 200-5 MCG/ML-% IV SOLN: 0.0000 ug/min | INTRAVENOUS | Status: DC

## 2016-05-25 MED ORDER — HEPARIN (PORCINE) IN NACL 2-0.9 UNIT/ML-% IJ SOLN
INTRAMUSCULAR | Status: DC | PRN
  Administered 2016-05-25: 1000 mL

## 2016-05-25 MED ORDER — CLOPIDOGREL BISULFATE 300 MG PO TABS
ORAL_TABLET | ORAL | Status: DC | PRN
  Administered 2016-05-25: 600 mg via ORAL

## 2016-05-25 MED ORDER — LABETALOL HCL 5 MG/ML IV SOLN: 10.0000 mg | INTRAVENOUS | Status: AC | PRN

## 2016-05-25 MED ORDER — HEPARIN SODIUM (PORCINE) 1000 UNIT/ML IJ SOLN
INTRAMUSCULAR | Status: AC
  Filled 2016-05-25: qty 1

## 2016-05-25 MED ORDER — LIDOCAINE HCL (PF) 1 % IJ SOLN
INTRAMUSCULAR | Status: DC | PRN
  Administered 2016-05-25: 2 mL via INTRADERMAL

## 2016-05-25 MED ORDER — ONDANSETRON HCL 4 MG/2ML IJ SOLN
4.0000 mg | Freq: Four times a day (QID) | INTRAMUSCULAR | Status: DC | PRN
Start: 2016-05-25 — End: 2016-05-26
  Administered 2016-05-25: 4 mg via INTRAVENOUS
  Filled 2016-05-25: qty 2

## 2016-05-25 MED ORDER — LIDOCAINE HCL (PF) 1 % IJ SOLN
INTRAMUSCULAR | Status: AC
  Filled 2016-05-25: qty 30

## 2016-05-25 MED ORDER — METOPROLOL TARTRATE 12.5 MG HALF TABLET
12.5000 mg | ORAL_TABLET | Freq: Two times a day (BID) | ORAL | Status: DC
  Administered 2016-05-25 – 2016-05-26 (×3): 12.5 mg via ORAL
  Filled 2016-05-25 (×3): qty 1

## 2016-05-25 MED ORDER — SODIUM CHLORIDE 0.9 % WEIGHT BASED INFUSION: 1.0000 mL/kg/h | INTRAVENOUS | Status: AC

## 2016-05-25 MED ORDER — SODIUM CHLORIDE 0.9 % WEIGHT BASED INFUSION: 3.0000 mL/kg/h | INTRAVENOUS | Status: DC

## 2016-05-25 MED ORDER — ALUM & MAG HYDROXIDE-SIMETH 200-200-20 MG/5ML PO SUSP
30.0000 mL | ORAL | Status: DC | PRN
  Administered 2016-05-25: 16:00:00 30 mL via ORAL
  Filled 2016-05-25: qty 30

## 2016-05-25 MED ORDER — ACETAMINOPHEN 325 MG PO TABS
650.0000 mg | ORAL_TABLET | ORAL | Status: DC | PRN
  Administered 2016-05-25 – 2016-05-26 (×2): 650 mg via ORAL
  Filled 2016-05-25 (×2): qty 2

## 2016-05-25 MED ORDER — CLOPIDOGREL BISULFATE 75 MG PO TABS
75.0000 mg | ORAL_TABLET | Freq: Every day | ORAL | Status: DC
  Administered 2016-05-26: 75 mg via ORAL
  Filled 2016-05-25: qty 1

## 2016-05-25 MED ORDER — FENTANYL CITRATE (PF) 100 MCG/2ML IJ SOLN
INTRAMUSCULAR | Status: AC
  Filled 2016-05-25: qty 2

## 2016-05-25 MED ORDER — SODIUM CHLORIDE 0.9% FLUSH
3.0000 mL | Freq: Two times a day (BID) | INTRAVENOUS | Status: DC
  Administered 2016-05-26: 3 mL via INTRAVENOUS

## 2016-05-25 MED ORDER — IOPAMIDOL (ISOVUE-370) INJECTION 76%
INTRAVENOUS | Status: AC
  Filled 2016-05-25: qty 125

## 2016-05-25 MED ORDER — MIDAZOLAM HCL 2 MG/2ML IJ SOLN
INTRAMUSCULAR | Status: DC | PRN
  Administered 2016-05-25 (×2): 1 mg via INTRAVENOUS

## 2016-05-25 MED ORDER — HEPARIN SODIUM (PORCINE) 1000 UNIT/ML IJ SOLN
INTRAMUSCULAR | Status: DC | PRN
  Administered 2016-05-25: 5000 [IU] via INTRAVENOUS
  Administered 2016-05-25: 4000 [IU] via INTRAVENOUS
  Administered 2016-05-25: 3000 [IU] via INTRAVENOUS

## 2016-05-25 MED ORDER — FAMOTIDINE IN NACL 20-0.9 MG/50ML-% IV SOLN
INTRAVENOUS | Status: AC
  Filled 2016-05-25: qty 50

## 2016-05-25 MED ORDER — TRAZODONE HCL 50 MG PO TABS
200.0000 mg | ORAL_TABLET | Freq: Every day | ORAL | Status: DC
  Administered 2016-05-25 (×2): 200 mg via ORAL
  Filled 2016-05-25: qty 4
  Filled 2016-05-25: qty 2

## 2016-05-25 MED ORDER — FAMOTIDINE IN NACL 20-0.9 MG/50ML-% IV SOLN
INTRAVENOUS | Status: DC | PRN
  Administered 2016-05-25: 20 mg via INTRAVENOUS

## 2016-05-25 MED ORDER — ALBUTEROL SULFATE (2.5 MG/3ML) 0.083% IN NEBU: 2.5000 mg | INHALATION_SOLUTION | Freq: Four times a day (QID) | RESPIRATORY_TRACT | Status: DC | PRN

## 2016-05-25 MED ORDER — PANTOPRAZOLE SODIUM 40 MG PO TBEC
40.0000 mg | DELAYED_RELEASE_TABLET | Freq: Every day | ORAL | Status: DC
  Administered 2016-05-25 – 2016-05-26 (×2): 40 mg via ORAL
  Filled 2016-05-25 (×2): qty 1

## 2016-05-25 MED ORDER — ASPIRIN EC 81 MG PO TBEC
81.0000 mg | DELAYED_RELEASE_TABLET | Freq: Every day | ORAL | Status: DC
  Administered 2016-05-25 – 2016-05-26 (×2): 81 mg via ORAL
  Filled 2016-05-25 (×2): qty 1

## 2016-05-25 MED ORDER — NITROGLYCERIN 1 MG/10 ML FOR IR/CATH LAB
INTRA_ARTERIAL | Status: DC | PRN
  Administered 2016-05-25 (×2): 200 ug via INTRACORONARY

## 2016-05-25 SURGICAL SUPPLY — 20 items
BALLN ANGIOSCULPT RX 3.5X10 (BALLOONS) ×3
BALLN ~~LOC~~ EMERGE MR 4.0X12 (BALLOONS) ×3
BALLOON ANGIOSCULPT RX 3.5X10 (BALLOONS) IMPLANT
BALLOON ~~LOC~~ EMERGE MR 4.0X12 (BALLOONS) IMPLANT
CATH EXPO 5F IM (CATHETERS) ×2 IMPLANT
CATH EXPO 5F MPA-1 (CATHETERS) ×2 IMPLANT
CATH INFINITI 5 FR AR1 MOD (CATHETERS) ×2 IMPLANT
CATH INFINITI 5FR AL1 (CATHETERS) ×2 IMPLANT
CATH INFINITI 5FR MULTPACK ANG (CATHETERS) ×2 IMPLANT
CATH VISTA GUIDE 6FR XB3.5 (CATHETERS) ×2 IMPLANT
DEVICE RAD COMP TR BAND LRG (VASCULAR PRODUCTS) ×2 IMPLANT
GLIDESHEATH SLEND A-KIT 6F 22G (SHEATH) ×2 IMPLANT
GUIDEWIRE INQWIRE 1.5J.035X260 (WIRE) IMPLANT
INQWIRE 1.5J .035X260CM (WIRE) ×3
KIT ENCORE 26 ADVANTAGE (KITS) ×2 IMPLANT
KIT HEART LEFT (KITS) ×3 IMPLANT
PACK CARDIAC CATHETERIZATION (CUSTOM PROCEDURE TRAY) ×3 IMPLANT
TRANSDUCER W/STOPCOCK (MISCELLANEOUS) ×3 IMPLANT
TUBING CIL FLEX 10 FLL-RA (TUBING) ×3 IMPLANT
WIRE ASAHI PROWATER 180CM (WIRE) ×2 IMPLANT

## 2016-05-25 NOTE — Progress Notes (Signed)
ANTICOAGULATION CONSULT NOTE - Follow Up Consult  Pharmacy Consult for Heparin Indication: chest pain/ACS  Allergies  Allergen Reactions  . Amitriptyline Other (See Comments)    Unknown reaction  . Benadryl [Diphenhydramine] Shortness Of Breath  . Demerol [Meperidine] Other (See Comments)    Unknown reaction  . Gabapentin Other (See Comments)    Unknown reaction  . Loratadine Other (See Comments)    Unknown reaction  . Meperidine Hcl Other (See Comments)    Unknown reaction  . Mirtazapine Other (See Comments)    Unknown reaction  . Olanzapine Other (See Comments)    Unknown reaction   . Voltaren [Diclofenac Sodium] Shortness Of Breath  . Zetia [Ezetimibe] Other (See Comments)    Weakness in legs, shakiness all over  . Ativan [Lorazepam] Other (See Comments)    Causes double vision at highter than .5 mg dose  . Atorvastatin Other (See Comments)    Muscle aches and weakness  . Budesonide-Formoterol Fumarate Other (See Comments)    Shakiness, tremors  . Bupropion Hcl Other (See Comments)    "cloud over me" depression  . Caffeine Other (See Comments)    jitters  . Codeine Sulfate Other (See Comments)    Makes chest hurt like a heart attack  . Lisinopril Cough  . Metformin Nausea And Vomiting  . Mometasone Furoate Nausea And Vomiting  . Morphine Sulfate Other (See Comments)    Chest pain like a heart attack  . Other Other (See Comments)    Beta Blockers, reaction shortness of breath  . Oxycodone-Acetaminophen Nausea And Vomiting  . Pioglitazone Other (See Comments)    Cannot take because of risk of bladder cancer  . Propoxyphene N-Acetaminophen Nausea And Vomiting  . Rosuvastatin Other (See Comments)    Muscle aches and weakness  . Shellfish Allergy Diarrhea  . Tramadol Nausea Only  . Venlafaxine Other (See Comments)    Unknown reaction  . Zolpidem Tartrate Other (See Comments)     Jittery, diarrhea  . Latex Rash    Patient Measurements: Height: 5\' 6"  (167.6  cm) Weight: 194 lb 14.4 oz (88.4 kg) IBW/kg (Calculated) : 59.3 Heparin Dosing Weight: 78.3 kg  Vital Signs: Temp: 98.4 F (36.9 C) (12/22 0510) Temp Source: Oral (12/22 0510) BP: 130/61 (12/22 0510) Pulse Rate: 58 (12/22 0510)  Labs:  Recent Labs  05/24/16 2023 05/25/16 0035 05/25/16 0650  HGB 11.6*  --  11.4*  HCT 35.5*  --  35.8*  PLT 206  --  210  LABPROT  --   --  14.4  INR  --   --  1.12  HEPARINUNFRC  --   --  <0.10*  CREATININE 0.85  --  0.88  CKTOTAL  --  65 61  CKMB  --  2.1 1.6  TROPONINI  --  <0.03 <0.03    Estimated Creatinine Clearance: 65.6 mL/min (by C-G formula based on SCr of 0.88 mg/dL).  Assessment: 70 y/o F to start heparin for CP with known h/o CAD, troponin currently negative, Hgb 11.6,  Anticoag: CP. Enzymes negative. HL <0.1. Hgb/Plts stable overnight.  Goal of Therapy:  Heparin level 0.3-0.7 units/ml Monitor platelets by anticoagulation protocol: Yes   Plan:  Heparin 2000 unit bolus Heparin infusion increase to 1000 units/hr Recheck HL in 6-8 hrs vs f/u after cath.   Lakechia Nay S. Alford Highland, PharmD, [place] Clinical Staff Pharmacist Pager [phone]  [place], [place] 05/25/2016,8:26 AM

## 2016-05-25 NOTE — Progress Notes (Signed)
Patient Name: [name] Date of Encounter: 05/25/2016  Primary Cardiologist:   Hospital Problem List     Principal Problem:   Chest pain with moderate risk of acute coronary syndrome Active Problems:   Coronary Artery Disease s/p CABG 10/2013   Insulin dependent diabetes mellitus ([place])   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   Obstructive sleep apnea   Essential hypertension   Depression with anxiety   Dyslipidemia-statin ontol     Subjective   Anxious, c/o palpitation followed by "quivering"- "I can't go on like this". I did not get a history of tachycardia or chest pain that sounds like angina  Inpatient Medications    Scheduled Meds: . aspirin EC  81 mg Oral Daily  . FLUoxetine  40 mg Oral BID  .  HYDROmorphone (DILAUDID) injection  0.5 mg Intravenous Once  . levothyroxine  150 mcg Oral QAC breakfast  . metoprolol tartrate  12.5 mg Oral BID  . pantoprazole  40 mg Oral Daily  . traZODone  200 mg Oral QHS   Continuous Infusions: . heparin 1,000 Units/hr (05/25/16 0905)   PRN Meds: acetaminophen, albuterol, nitroGLYCERIN, ondansetron (ZOFRAN) IV   Vital Signs    Vitals:   05/24/16 2345 05/25/16 0000 05/25/16 0033 05/25/16 0510  BP: 122/56 131/68 131/85 130/61  Pulse: 64 65 66 (!) 58  Resp: 15 17 16 17   Temp:   98.4 F (36.9 C) 98.4 F (36.9 C)  TempSrc:   Oral Oral  SpO2: 97% 96% 97% 96%  Weight:   194 lb 14.4 oz (88.4 kg)   Height:   5\' 6"  (1.676 m)     Intake/Output Summary (Last 24 hours) at 05/25/16 0959 Last data filed at 05/25/16 0017  Gross per 24 hour  Intake                0 ml  Output              350 ml  Net             -350 ml   Filed Weights   05/24/16 2021 05/25/16 0033  Weight: 198 lb (89.8 kg) 194 lb 14.4 oz (88.4 kg)    Physical Exam    GEN: Obese well developed, in no acute distress.  HEENT: Grossly normal.  Neck: Supple, no JVD, carotid bruits, or masses. Cardiac: RRR, 2/6 systolic murmur AOV, no rubs, or gallops. No clubbing,  cyanosis, edema.  Radials/DP/PT 2+ and equal bilaterally.  Respiratory:  Respirations regular and unlabored, clear to auscultation bilaterally. GI: Soft, nontender, nondistended, BS + x 4. MS: no deformity or atrophy. Skin: warm and dry, no rash. Neuro:  Strength and sensation are intact. Psych: AAOx3.  anxious  Labs    CBC  Recent Labs  05/24/16 2023 05/25/16 0650  WBC 11.5* 9.1  NEUTROABS  --  6.3  HGB 11.6* 11.4*  HCT 35.5* 35.8*  MCV 82.6 82.7  PLT 206 [id number]   Basic Metabolic Panel  Recent Labs  05/24/16 2023 05/25/16 0650  NA 136 138  K 4.2 4.2  CL 103 103  CO2 26 26  GLUCOSE 209* 107*  BUN 12 10  CREATININE 0.85 0.88  CALCIUM 8.8* 8.9    Recent Labs  05/25/16 0035 05/25/16 0650  CKTOTAL 65 61  CKMB 2.1 1.6  TROPONINI <0.03 <0.03    Recent Labs  05/25/16 0035  TSH 1.873    Telemetry    NSR, pauses from blocked PACs - Personally Reviewed  ECG    NSR, 1st degree AVB - Personally Reviewed  Radiology    Dg Chest 2 View  Result Date: 05/24/2016 CLINICAL DATA:  Chest pain for 2 weeks, worsening. EXAM: CHEST  2 VIEW COMPARISON:  Chest CT 07/19/2015, radiographs 06/08/2015 FINDINGS: Patient is post median sternotomy and aortic valve replacement. Stable heart size and mediastinal contours from prior exam. Mild increased interstitial opacities are chronic. There is no evidence of pulmonary edema. No focal airspace disease or pleural fluid. No pneumothorax. No acute osseous abnormality. IMPRESSION: No acute abnormality or change from prior exams. Post aortic valve replacement. Electronically Signed   By: Jeb Levering M.D.   On: 05/24/2016 21:16    Cardiac Studies   Echo pending  Patient Profile   70 yo F w/ a PMH significant for 3vCAD -s/p CABG x 2  (LIMA->LAD and SVG-RCA) 2015 with bioprosthetic AVR, IDDM, and COPD. Pt adm 05/24/16 c/o acute on chronic CP. She had a neg chest CTA 7/15, low risk Myoview Feb 2016, and recent neg chest CTA Feb  2017.    Assessment & Plan    1. Chest pain- She describes palpitation- hard beat followed by "quivering and chest discomfort. Does not sound like angina.  MI r/o so far, echo pending  2. CAD/AS- CABG x 2 and tissue AVR 2015, she had a couple of sternal wires removed 6 months later. Myoview low risk feb 2016  3. IDDM-   4. COPD  5. Blocked PACs-transientt [place], baseline 1st degree AVB,  symptomatic  Plan: MD to see-    Signed, Kerin Ransom, PA-C  05/25/2016, 9:59 AM   Attending Note:   The patient was seen and examined.  Agree with assessment and plan as noted above.  Changes made to the above note as needed.  Patient seen and independently examined with Kerin Ransom, PA .   We discussed all aspects of the encounter. I agree with the assessment and plan as stated above.  1. CAD :the patient presents with very unusual symptoms Mostly palpitations Some chest pain Says she cannot go on living like this  Symptoms are diffenent from presenting symptoms when she presented for bypass grafting.  Given the fact that she has severe coronary artery disease I think that we should proceed with cardiac cath. She needs reassurance that she does not have a blockage.  2. Palpitations: She has Mobitz type I AV block. She has palpitations but I reassured her that this type of palpitation was benign. She may need further monitoring to make sure she doesn't have other arrhythmias. I would not increase her metoprolol since she has Mobitz type I block.     I have spent a total of 40 minutes with patient reviewing hospital  notes , telemetry, EKGs, labs and examining patient as well as establishing an assessment and plan that was discussed with the patient. > 50% of time was spent in direct patient care.    Thayer Headings, Brooke Bonito., MD, [facility] 05/25/2016, 10:57 AM [address],  [place] Pager [phone]

## 2016-05-25 NOTE — Progress Notes (Signed)
Patient arrived to floor via bed at approx 1605 complaining of left finger numbness and tingling as well as heartburn. Leanne Lovely RN removed 3cc of air from TR band and patient stated the numbness and tingling was resolved. Left radial site remains a level 0 with no bleeding and no hematoma noted. Patient also medicated with Maalox for heartburn. Report given to Roselle RN without incident. No other S/S of distress noted or complaints voiced at this time.

## 2016-05-25 NOTE — Progress Notes (Signed)
TR BAND REMOVAL  LOCATION:    left radial  DEFLATED PER PROTOCOL:    Yes.    TIME BAND OFF / DRESSING APPLIED:    19:30  SITE UPON ARRIVAL:    Level 0  SITE AFTER BAND REMOVAL:    Level 0  CIRCULATION SENSATION AND MOVEMENT:    Within Normal Limits   Yes.    COMMENTS:   Post TR band instructions given. Pt tolerated well.

## 2016-05-25 NOTE — Interval H&P Note (Signed)
History and Physical Interval Note:  05/25/2016 1:51 PM  [name]  has presented today for surgery, with the diagnosis of cp/PALPITATIONS - Concerning for Atypical Angina.   The various methods of treatment have been discussed with the patient and family. After consideration of risks, benefits and other options for treatment, the patient has consented to  Procedure(s): Left Heart Cath and Cors/Grafts Angiography (N/A) with possible Percutaneous Coronary Intervention as a surgical intervention .  The patient's history has been reviewed, patient examined, no change in status, stable for surgery.  I have reviewed the patient's chart and labs.  Questions were answered to the patient's satisfaction.    Cath Lab Visit (complete for each Cath Lab visit)  Clinical Evaluation Leading to the Procedure:   ACS: No. - unsure  Non-ACS:    Anginal Classification: CCS III  Anti-ischemic medical therapy: Minimal Therapy (1 class of medications)  Non-Invasive Test Results: No non-invasive testing performed  Prior CABG: Previous CABG   Glenetta Hew

## 2016-05-25 NOTE — H&P (View-Only) (Signed)
Patient Name: [name] Date of Encounter: 05/25/2016  Primary Cardiologist:   Hospital Problem List     Principal Problem:   Chest pain with moderate risk of acute coronary syndrome Active Problems:   Coronary Artery Disease s/p CABG 10/2013   Insulin dependent diabetes mellitus ([place])   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   Obstructive sleep apnea   Essential hypertension   Depression with anxiety   Dyslipidemia-statin ontol     Subjective   Anxious, c/o palpitation followed by "quivering"- "I can't go on like this". I did not get a history of tachycardia or chest pain that sounds like angina  Inpatient Medications    Scheduled Meds: . aspirin EC  81 mg Oral Daily  . FLUoxetine  40 mg Oral BID  .  HYDROmorphone (DILAUDID) injection  0.5 mg Intravenous Once  . levothyroxine  150 mcg Oral QAC breakfast  . metoprolol tartrate  12.5 mg Oral BID  . pantoprazole  40 mg Oral Daily  . traZODone  200 mg Oral QHS   Continuous Infusions: . heparin 1,000 Units/hr (05/25/16 0905)   PRN Meds: acetaminophen, albuterol, nitroGLYCERIN, ondansetron (ZOFRAN) IV   Vital Signs    Vitals:   05/24/16 2345 05/25/16 0000 05/25/16 0033 05/25/16 0510  BP: 122/56 131/68 131/85 130/61  Pulse: 64 65 66 (!) 58  Resp: 15 17 16 17   Temp:   98.4 F (36.9 C) 98.4 F (36.9 C)  TempSrc:   Oral Oral  SpO2: 97% 96% 97% 96%  Weight:   194 lb 14.4 oz (88.4 kg)   Height:   5\' 6"  (1.676 m)     Intake/Output Summary (Last 24 hours) at 05/25/16 0959 Last data filed at 05/25/16 0017  Gross per 24 hour  Intake                0 ml  Output              350 ml  Net             -350 ml   Filed Weights   05/24/16 2021 05/25/16 0033  Weight: 198 lb (89.8 kg) 194 lb 14.4 oz (88.4 kg)    Physical Exam    GEN: Obese well developed, in no acute distress.  HEENT: Grossly normal.  Neck: Supple, no JVD, carotid bruits, or masses. Cardiac: RRR, 2/6 systolic murmur AOV, no rubs, or gallops. No clubbing,  cyanosis, edema.  Radials/DP/PT 2+ and equal bilaterally.  Respiratory:  Respirations regular and unlabored, clear to auscultation bilaterally. GI: Soft, nontender, nondistended, BS + x 4. MS: no deformity or atrophy. Skin: warm and dry, no rash. Neuro:  Strength and sensation are intact. Psych: AAOx3.  anxious  Labs    CBC  Recent Labs  05/24/16 2023 05/25/16 0650  WBC 11.5* 9.1  NEUTROABS  --  6.3  HGB 11.6* 11.4*  HCT 35.5* 35.8*  MCV 82.6 82.7  PLT 206 [id number]   Basic Metabolic Panel  Recent Labs  05/24/16 2023 05/25/16 0650  NA 136 138  K 4.2 4.2  CL 103 103  CO2 26 26  GLUCOSE 209* 107*  BUN 12 10  CREATININE 0.85 0.88  CALCIUM 8.8* 8.9    Recent Labs  05/25/16 0035 05/25/16 0650  CKTOTAL 65 61  CKMB 2.1 1.6  TROPONINI <0.03 <0.03    Recent Labs  05/25/16 0035  TSH 1.873    Telemetry    NSR, pauses from blocked PACs - Personally Reviewed  ECG    NSR, 1st degree AVB - Personally Reviewed  Radiology    Dg Chest 2 View  Result Date: 05/24/2016 CLINICAL DATA:  Chest pain for 2 weeks, worsening. EXAM: CHEST  2 VIEW COMPARISON:  Chest CT 07/19/2015, radiographs 06/08/2015 FINDINGS: Patient is post median sternotomy and aortic valve replacement. Stable heart size and mediastinal contours from prior exam. Mild increased interstitial opacities are chronic. There is no evidence of pulmonary edema. No focal airspace disease or pleural fluid. No pneumothorax. No acute osseous abnormality. IMPRESSION: No acute abnormality or change from prior exams. Post aortic valve replacement. Electronically Signed   By: Jeb Levering M.D.   On: 05/24/2016 21:16    Cardiac Studies   Echo pending  Patient Profile   70 yo F w/ a PMH significant for 3vCAD -s/p CABG x 2  (LIMA->LAD and SVG-RCA) 2015 with bioprosthetic AVR, IDDM, and COPD. Pt adm 05/24/16 c/o acute on chronic CP. She had a neg chest CTA 7/15, low risk Myoview Feb 2016, and recent neg chest CTA Feb  2017.    Assessment & Plan    1. Chest pain- She describes palpitation- hard beat followed by "quivering and chest discomfort. Does not sound like angina.  MI r/o so far, echo pending  2. CAD/AS- CABG x 2 and tissue AVR 2015, she had a couple of sternal wires removed 6 months later. Myoview low risk feb 2016  3. IDDM-   4. COPD  5. Blocked PACs-transientt [place], baseline 1st degree AVB,  symptomatic  Plan: MD to see-    Signed, Kerin Ransom, PA-C  05/25/2016, 9:59 AM   Attending Note:   The patient was seen and examined.  Agree with assessment and plan as noted above.  Changes made to the above note as needed.  Patient seen and independently examined with Kerin Ransom, PA .   We discussed all aspects of the encounter. I agree with the assessment and plan as stated above.  1. CAD :the patient presents with very unusual symptoms Mostly palpitations Some chest pain Says she cannot go on living like this  Symptoms are diffenent from presenting symptoms when she presented for bypass grafting.  Given the fact that she has severe coronary artery disease I think that we should proceed with cardiac cath. She needs reassurance that she does not have a blockage.  2. Palpitations: She has Mobitz type I AV block. She has palpitations but I reassured her that this type of palpitation was benign. She may need further monitoring to make sure she doesn't have other arrhythmias. I would not increase her metoprolol since she has Mobitz type I block.     I have spent a total of 40 minutes with patient reviewing hospital  notes , telemetry, EKGs, labs and examining patient as well as establishing an assessment and plan that was discussed with the patient. > 50% of time was spent in direct patient care.    Thayer Headings, Brooke Bonito., MD, [facility] 05/25/2016, 10:57 AM [address]  [place] Pager [phone]

## 2016-05-25 NOTE — Assessment & Plan Note (Signed)
C-pap intol 

## 2016-05-26 ENCOUNTER — Observation Stay (HOSPITAL_COMMUNITY)
Admission: EM | Admit: 2016-05-26 | Discharge: 2016-05-27 | Disposition: A | Discharge status: Home / Self Care | Payer: Medicare Other | Attending: Internal Medicine | Admitting: Internal Medicine

## 2016-05-26 ENCOUNTER — Encounter (HOSPITAL_COMMUNITY): Payer: Self-pay | Admitting: Emergency Medicine

## 2016-05-26 ENCOUNTER — Emergency Department (HOSPITAL_COMMUNITY): Payer: Medicare Other

## 2016-05-26 DIAGNOSIS — Z823 Family history of stroke: Secondary | ICD-10-CM | POA: Insufficient documentation

## 2016-05-26 DIAGNOSIS — J449 Chronic obstructive pulmonary disease, unspecified: Secondary | ICD-10-CM | POA: Insufficient documentation

## 2016-05-26 DIAGNOSIS — I214 Non-ST elevation (NSTEMI) myocardial infarction: Secondary | ICD-10-CM

## 2016-05-26 DIAGNOSIS — Z7982 Long term (current) use of aspirin: Secondary | ICD-10-CM | POA: Insufficient documentation

## 2016-05-26 DIAGNOSIS — R0600 Dyspnea, unspecified: Secondary | ICD-10-CM | POA: Diagnosis not present

## 2016-05-26 DIAGNOSIS — G4700 Insomnia, unspecified: Secondary | ICD-10-CM | POA: Insufficient documentation

## 2016-05-26 DIAGNOSIS — I10 Essential (primary) hypertension: Secondary | ICD-10-CM

## 2016-05-26 DIAGNOSIS — Z8249 Family history of ischemic heart disease and other diseases of the circulatory system: Secondary | ICD-10-CM | POA: Insufficient documentation

## 2016-05-26 DIAGNOSIS — T82855D Stenosis of coronary artery stent, subsequent encounter: Secondary | ICD-10-CM | POA: Insufficient documentation

## 2016-05-26 DIAGNOSIS — G4733 Obstructive sleep apnea (adult) (pediatric): Secondary | ICD-10-CM | POA: Insufficient documentation

## 2016-05-26 DIAGNOSIS — I4430 Unspecified atrioventricular block: Secondary | ICD-10-CM

## 2016-05-26 DIAGNOSIS — R0789 Other chest pain: Secondary | ICD-10-CM | POA: Insufficient documentation

## 2016-05-26 DIAGNOSIS — Y712 Prosthetic and other implants, materials and accessory cardiovascular devices associated with adverse incidents: Secondary | ICD-10-CM | POA: Insufficient documentation

## 2016-05-26 DIAGNOSIS — F3341 Major depressive disorder, recurrent, in partial remission: Secondary | ICD-10-CM | POA: Insufficient documentation

## 2016-05-26 DIAGNOSIS — M1990 Unspecified osteoarthritis, unspecified site: Secondary | ICD-10-CM | POA: Insufficient documentation

## 2016-05-26 DIAGNOSIS — K5730 Diverticulosis of large intestine without perforation or abscess without bleeding: Secondary | ICD-10-CM | POA: Insufficient documentation

## 2016-05-26 DIAGNOSIS — Z91040 Latex allergy status: Secondary | ICD-10-CM | POA: Insufficient documentation

## 2016-05-26 DIAGNOSIS — Z953 Presence of xenogenic heart valve: Secondary | ICD-10-CM | POA: Insufficient documentation

## 2016-05-26 DIAGNOSIS — G4489 Other headache syndrome: Secondary | ICD-10-CM | POA: Insufficient documentation

## 2016-05-26 DIAGNOSIS — Z951 Presence of aortocoronary bypass graft: Secondary | ICD-10-CM | POA: Insufficient documentation

## 2016-05-26 DIAGNOSIS — E7800 Pure hypercholesterolemia, unspecified: Secondary | ICD-10-CM | POA: Insufficient documentation

## 2016-05-26 DIAGNOSIS — R002 Palpitations: Secondary | ICD-10-CM | POA: Insufficient documentation

## 2016-05-26 DIAGNOSIS — I441 Atrioventricular block, second degree: Secondary | ICD-10-CM | POA: Insufficient documentation

## 2016-05-26 DIAGNOSIS — E890 Postprocedural hypothyroidism: Secondary | ICD-10-CM | POA: Insufficient documentation

## 2016-05-26 DIAGNOSIS — F418 Other specified anxiety disorders: Secondary | ICD-10-CM | POA: Insufficient documentation

## 2016-05-26 DIAGNOSIS — Z885 Allergy status to narcotic agent status: Secondary | ICD-10-CM | POA: Insufficient documentation

## 2016-05-26 DIAGNOSIS — Z803 Family history of malignant neoplasm of breast: Secondary | ICD-10-CM | POA: Insufficient documentation

## 2016-05-26 DIAGNOSIS — E119 Type 2 diabetes mellitus without complications: Secondary | ICD-10-CM | POA: Insufficient documentation

## 2016-05-26 DIAGNOSIS — K219 Gastro-esophageal reflux disease without esophagitis: Secondary | ICD-10-CM | POA: Insufficient documentation

## 2016-05-26 DIAGNOSIS — Z952 Presence of prosthetic heart valve: Secondary | ICD-10-CM

## 2016-05-26 DIAGNOSIS — Z955 Presence of coronary angioplasty implant and graft: Secondary | ICD-10-CM | POA: Insufficient documentation

## 2016-05-26 DIAGNOSIS — R0602 Shortness of breath: Secondary | ICD-10-CM | POA: Diagnosis not present

## 2016-05-26 DIAGNOSIS — I252 Old myocardial infarction: Secondary | ICD-10-CM | POA: Insufficient documentation

## 2016-05-26 DIAGNOSIS — Z794 Long term (current) use of insulin: Secondary | ICD-10-CM | POA: Insufficient documentation

## 2016-05-26 DIAGNOSIS — Z87891 Personal history of nicotine dependence: Secondary | ICD-10-CM | POA: Insufficient documentation

## 2016-05-26 DIAGNOSIS — I2510 Atherosclerotic heart disease of native coronary artery without angina pectoris: Secondary | ICD-10-CM | POA: Insufficient documentation

## 2016-05-26 DIAGNOSIS — K279 Peptic ulcer, site unspecified, unspecified as acute or chronic, without hemorrhage or perforation: Secondary | ICD-10-CM

## 2016-05-26 DIAGNOSIS — D509 Iron deficiency anemia, unspecified: Secondary | ICD-10-CM | POA: Insufficient documentation

## 2016-05-26 LAB — BASIC METABOLIC PANEL
Anion gap: 7 (ref 5–15)
Anion gap: 10 (ref 5–15)
BUN: 11 mg/dL (ref 6–20)
BUN: 13 mg/dL (ref 6–20)
CHLORIDE: 104 mmol/L (ref 101–111)
CO2: 27 mmol/L (ref 22–32)
CO2: 24 mmol/L (ref 22–32)
CREATININE: 0.9 mg/dL (ref 0.44–1.00)
Calcium: 8.2 mg/dL — ABNORMAL LOW (ref 8.9–10.3)
Calcium: 8.4 mg/dL — ABNORMAL LOW (ref 8.9–10.3)
Chloride: 97 mmol/L — ABNORMAL LOW (ref 101–111)
Creatinine, Ser: 0.91 mg/dL (ref 0.44–1.00)
GFR calc non Af Amer: >60 mL/min (ref >60)
GFR calc non Af Amer: >60 mL/min (ref >60)
GLUCOSE: 144 mg/dL — AB (ref 65–99)
GLUCOSE: 133 mg/dL — AB (ref 65–99)
Potassium: 4.4 mmol/L (ref 3.5–5.1)
Potassium: 3.8 mmol/L (ref 3.5–5.1)
Sodium: 138 mmol/L (ref 135–145)
Sodium: 131 mmol/L — ABNORMAL LOW (ref 135–145)

## 2016-05-26 LAB — CBC WITH DIFFERENTIAL/PLATELET
Basophils Absolute: 0 10*3/uL (ref 0.0–0.1)
Basophils Relative: 0 %
EOS ABS: 0.2 10*3/uL (ref 0.0–0.7)
Eosinophils Relative: 2 %
HEMATOCRIT: 33.8 % — AB (ref 36.0–46.0)
HEMOGLOBIN: 10.8 g/dL — AB (ref 12.0–15.0)
LYMPHS ABS: 1.4 10*3/uL (ref 0.7–4.0)
Lymphocytes Relative: 16 %
MCH: 27.1 pg (ref 26.0–34.0)
MCHC: 32 g/dL (ref 30.0–36.0)
MCV: 84.7 fL (ref 78.0–100.0)
MONO ABS: 0.6 10*3/uL (ref 0.1–1.0)
MONOS PCT: 7 %
NEUTROS ABS: 6.6 10*3/uL (ref 1.7–7.7)
NEUTROS PCT: 75 %
Platelets: 186 10*3/uL (ref 150–400)
RBC: 3.99 MIL/uL (ref 3.87–5.11)
RDW: 15.4 % (ref 11.5–15.5)
WBC: 8.8 10*3/uL (ref 4.0–10.5)

## 2016-05-26 LAB — HEMOGLOBIN A1C
HEMOGLOBIN A1C: 6.9 % — AB (ref 4.8–5.6)
Mean Plasma Glucose: 151 mg/dL

## 2016-05-26 LAB — CK TOTAL AND CKMB (NOT AT ARMC)
CK TOTAL: 91 U/L (ref 38–234)
CK, MB: 5.5 ng/mL — AB (ref 0.5–5.0)
CK, MB: 8.6 ng/mL — ABNORMAL HIGH (ref 0.5–5.0)
RELATIVE INDEX: INVALID (ref 0.0–2.5)
Relative Index: 8.5 — ABNORMAL HIGH (ref 0.0–2.5)
Total CK: 101 U/L (ref 38–234)

## 2016-05-26 LAB — CBC
HCT: 35.3 % — ABNORMAL LOW (ref 36.0–46.0)
HEMOGLOBIN: 11.5 g/dL — AB (ref 12.0–15.0)
MCH: 26.7 pg (ref 26.0–34.0)
MCHC: 32.6 g/dL (ref 30.0–36.0)
MCV: 81.9 fL (ref 78.0–100.0)
Platelets: 218 10*3/uL (ref 150–400)
RBC: 4.31 MIL/uL (ref 3.87–5.11)
RDW: 14.8 % (ref 11.5–15.5)
WBC: 9.1 10*3/uL (ref 4.0–10.5)

## 2016-05-26 LAB — I-STAT TROPONIN, ED: Troponin i, poc: 0.29 ng/mL (ref 0.00–0.08)

## 2016-05-26 LAB — PLATELET INHIBITION P2Y12: Platelet Function  P2Y12: 301 [PRU] (ref 194–418)

## 2016-05-26 LAB — GLUCOSE, CAPILLARY: Glucose-Capillary: 145 mg/dL — ABNORMAL HIGH (ref 65–99)

## 2016-05-26 LAB — TROPONIN I: TROPONIN I: 0.28 ng/mL — AB (ref <0.03)

## 2016-05-26 LAB — BRAIN NATRIURETIC PEPTIDE: B NATRIURETIC PEPTIDE 5: 328.4 pg/mL — AB (ref 0.0–100.0)

## 2016-05-26 MED ORDER — TICAGRELOR 90 MG PO TABS: 90.0000 mg | ORAL_TABLET | Freq: Two times a day (BID) | ORAL | 0 refills | Status: DC

## 2016-05-26 MED ORDER — FENTANYL CITRATE (PF) 100 MCG/2ML IJ SOLN
50.0000 ug | Freq: Once | INTRAMUSCULAR | Status: AC
  Administered 2016-05-26: 50 ug via INTRAVENOUS
  Filled 2016-05-26: qty 2

## 2016-05-26 MED ORDER — TICAGRELOR 90 MG PO TABS: 90.0000 mg | ORAL_TABLET | Freq: Two times a day (BID) | ORAL | 11 refills | Status: DC

## 2016-05-26 MED ORDER — PRASUGREL HCL 10 MG PO TABS: 10.0000 mg | ORAL_TABLET | Freq: Every day | ORAL | 11 refills | Status: DC

## 2016-05-26 MED ORDER — PANTOPRAZOLE SODIUM 40 MG PO TBEC: 40.0000 mg | DELAYED_RELEASE_TABLET | Freq: Every day | ORAL | 11 refills | Status: DC

## 2016-05-26 MED ORDER — METOPROLOL TARTRATE 25 MG PO TABS: 12.5000 mg | ORAL_TABLET | Freq: Two times a day (BID) | ORAL | 5 refills | Status: DC

## 2016-05-26 MED ORDER — PRASUGREL HCL 10 MG PO TABS: 10.0000 mg | ORAL_TABLET | Freq: Every day | ORAL | Status: DC

## 2016-05-26 MED ORDER — DIAZEPAM 2 MG PO TABS
2.0000 mg | ORAL_TABLET | Freq: Once | ORAL | Status: AC
  Administered 2016-05-26: 2 mg via ORAL
  Filled 2016-05-26: qty 1

## 2016-05-26 MED ORDER — NITROGLYCERIN 0.4 MG/SPRAY TL SOLN: 1.0000 | 12 refills | Status: DC | PRN

## 2016-05-26 MED ORDER — ASPIRIN 81 MG PO CHEW
324.0000 mg | CHEWABLE_TABLET | Freq: Once | ORAL | Status: AC
  Administered 2016-05-26: 324 mg via ORAL
  Filled 2016-05-26: qty 4

## 2016-05-26 MED ORDER — NITROGLYCERIN 0.4 MG SL SUBL
0.4000 mg | SUBLINGUAL_TABLET | SUBLINGUAL | Status: DC | PRN
  Administered 2016-05-26: 0.4 mg via SUBLINGUAL
  Filled 2016-05-26: qty 1

## 2016-05-26 MED ORDER — NITROGLYCERIN 0.4 MG/SPRAY TL SOLN: 1.0000 | 0 refills | Status: DC | PRN

## 2016-05-26 NOTE — Progress Notes (Signed)
CARDIAC REHAB PHASE I   PRE:  Rate/Rhythm: 65 sr  BP:  Sitting: 151/46      SaO2: 96 RA  MODE:  Ambulation: 500 ft   POST:  Rate/Rhythm: 89 sr  BP:  Sitting: 151/53     SaO2: 95 ra  [id number] Patient ambulated in hallway independently. Steady gait noted. C/O mild chronic dizziness. Denied cp/palpatations. Ambulatory sats 94-96%. Post ambulation patient back to room. Husband at bedside. Education completed. Encouraged husband to reinforce. Patient has a lot of anxiety which is chronic. Husband will enforce discharge education. Verbalized importance of anti-platelet, activity progression, and diabetic/cardiac diet compliance. Per patients request phase 2 order placed for [facility].  [name] English PayneRN, BSN 05/26/2016 8:12 AM

## 2016-05-26 NOTE — Discharge Summary (Signed)
Discharge Summary    Patient ID: [name]  MRN: [id number], DOB/AGE: April 13, 1946 70 y.o.  Admit Date: 05/24/2016 Discharge Date: 05/26/2016  Primary Care Provider: Glendon Axe, MD Primary Cardiologist: Dr. 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 Amitriptyline Other (See Comments)    Unknown reaction  . Benadryl [Diphenhydramine] Shortness Of Breath  . Demerol [Meperidine] Other (See Comments)    Unknown reaction  . Gabapentin Other (See Comments)    Unknown reaction  . Loratadine Other (See Comments)    Unknown reaction  . Meperidine Hcl Other (See Comments)    Unknown reaction  . Mirtazapine Other (See Comments)    Unknown reaction  . Olanzapine Other (See Comments)    Unknown reaction   . Voltaren [Diclofenac Sodium] Shortness Of Breath  . Zetia [Ezetimibe] Other (See Comments)    Weakness in legs, shakiness all over  . Ativan [Lorazepam] Other (See Comments)    Causes double vision at highter than .5 mg dose  . Atorvastatin Other (See Comments)    Muscle aches and weakness  . Budesonide-Formoterol Fumarate Other (See Comments)    Shakiness, tremors  . Bupropion Hcl Other (See Comments)    "cloud over me" depression  . Caffeine Other (See Comments)    jitters  . Codeine Sulfate Other (See Comments)    Makes chest hurt like a heart attack  . Lisinopril Cough  . Metformin Nausea And Vomiting  . Mometasone Furoate Nausea And Vomiting  . Morphine Sulfate Other (See Comments)    Chest pain like a heart attack  . Other Other (See Comments)    Beta  Blockers, reaction shortness of breath  . Oxycodone-Acetaminophen Nausea And Vomiting  . Pioglitazone Other (See Comments)    Cannot take because of risk of bladder cancer  . Propoxyphene N-Acetaminophen Nausea And Vomiting  . Rosuvastatin Other (See Comments)    Muscle aches and weakness  . Shellfish Allergy Diarrhea  . Tramadol Nausea Only  . Venlafaxine Other (See Comments)    Unknown reaction  . Zolpidem Tartrate Other (See Comments)     Jittery, diarrhea  . Latex Rash     History of Present Illness     70 year old female with history of 3 vessel CAD s/p 2 vessel CABG (LIMA-LAD, SVG-RCA), severe AR s/p bioprosthetic AVR, DM2, COPD and chronic chest pain who presented to [facility] on 12/21 with acute on chronic chest pain. Has known 3vCAD. Had a DES placed in RCA and mLCx in 10/2007. Admitted [place] w/ CP and had TTE wnl and Myoview neg for ischemia. Dr. Ida Rogue in [place] ordered LHC which showed 3vCAD. Referred for CABG only but intraop TEE showed mild AS mod-severe AR so patient also had bioprosthetic AVR (21 mm [employer]). Admitted to [facility] 07/2014 w/ CP and Myoview showed no ischemia. Has been on ASA but not statin intolerant BP usually well-controlled though not at last office visit. Discussion of PCSK9 inhibitor at [facility] 05/16/2016. Pt complaining of worsening palpitations/CP at that time. Happening daily. Feels palpitations and  has substernal CP. +SOB. Does not radiate. Improves SLN. Woke up on 12/21 with chest pain that did not respond to SL NTG.   Hospital Course     Consultants: cardiac rehab   Upon her arrival to [facility] she was noted to have an KG without acute st/t changes. Was hypertensive. Troponin negative x 2. CKMB 1.7-->5.5-->8.2. It was recommended she undergo LHC to further evaluate her symptoms given high pre-test probability for possible nuclear stress test.   She underwent LHC on 12/22 that showed her culprit lesion likely being ISR in the LCx that had  progressed from 2015 prior to her CABG. This was treated successfully with PTCA only, leaving a residual 10-50% stenosis that was similar to the remainder of the stented segment. She was noted to have patent LIMA-LAD which filled the diagonal branch as well, also with patent SVG-distal RVA. Of note she had Wenkebach on EKG post PCI, it was uncertain if this was playing a role in her palpitations. She had significant reflux from Plavix treated with Pepcid. Post PCI she did have some residual angina that was successfully treated with nitro gtt and weaned off without any further angina. P2Y12 was checked and found to be 301. This was discussed with Dr. Gwenlyn Found and she was changed to Effient on his recommendation. Post cath labs were stable. Nexium was changed to Protonix.   The patient's radial cath site has been examined is healing well without issues at this time. The patient has been seen by Dr. Gwenlyn Found, MD and felt to be stable for discharge today. All follow up appointments have been made. Discharge medications are listed below. Prescriptions have been reviewed with the patient and sent in to their pharmacy.  _____________  Discharge Vitals Blood pressure (!) 151/53, pulse (!) 56, temperature 97.6 F (36.4 C), temperature source Oral, resp. rate 17, height _0  (1.676 m), weight 192 lb 9.6 oz (87.4 kg), SpO2 95 %.  Filed Weights   05/24/16 2021 05/25/16 0033 05/26/16 0211  Weight: 198 lb (89.8 kg) 194 lb 14.4 oz (88.4 kg) 192 lb 9.6 oz (87.4 kg)    Labs & Radiologic Studies    CBC  Recent Labs  05/25/16 0650 05/26/16 0638  WBC 9.1 8.8  NEUTROABS 6.3 6.6  HGB 11.4* 10.8*  HCT 35.8* 33.8*  MCV 82.7 84.7  PLT 210 546   Basic Metabolic Panel  Recent Labs  05/25/16 0650 05/26/16 0638  NA 138 138  K 4.2 4.4  CL 103 104  CO2 26 27  GLUCOSE 107* 144*  BUN 10 11  CREATININE 0.88 0.91  CALCIUM 8.9 8.2*   Liver Function Tests No results for input(s): AST, ALT, ALKPHOS, BILITOT, PROT,  ALBUMIN in the last 72 hours. No results for input(s): LIPASE, AMYLASE in the last 72 hours. Cardiac Enzymes  Recent Labs  05/25/16 0035 05/25/16 0650 05/25/16 1255 05/25/16 1852 05/26/16 0101 05/26/16 0638  CKTOTAL 65 61 56 64 91 101  CKMB 2.1 1.6 1.3 1.7 5.5* 8.6*  TROPONINI <0.03 <0.03 <0.03  --   --   --    BNP Invalid input(s): POCBNP D-Dimer No results for input(s): DDIMER in the last 72 hours. Hemoglobin A1C  Recent Labs  05/25/16 0035  HGBA1C 6.9*   Fasting Lipid Panel No results for input(s): CHOL, HDL, LDLCALC, TRIG, CHOLHDL, LDLDIRECT in the last 72 hours. Thyroid Function Tests  Recent Labs  05/25/16 0035  TSH 1.873   _____________  Dg Chest 2 View  Result Date:  05/24/2016 CLINICAL DATA:  Chest pain for 2 weeks, worsening. EXAM: CHEST  2 VIEW COMPARISON:  Chest CT 07/19/2015, radiographs 06/08/2015 FINDINGS: Patient is post median sternotomy and aortic valve replacement. Stable heart size and mediastinal contours from prior exam. Mild increased interstitial opacities are chronic. There is no evidence of pulmonary edema. No focal airspace disease or pleural fluid. No pneumothorax. No acute osseous abnormality. IMPRESSION: No acute abnormality or change from prior exams. Post aortic valve replacement. Electronically Signed   By: Jeb Levering M.D.   On: 05/24/2016 21:16    Diagnostic Studies/Procedures   LHC 05/25/2016: Coronary Findings   Dominance: Right  Left Main  Vessel is large.  Left Anterior Descending  Mid LAD lesion, 100% stenosed. The lesion is chronically occluded.  First Diagonal Branch  Vessel is moderate in size. The vessel exhibits minimal luminal irregularities.  Lateral First Diagonal Branch  Vessel is small in size.  First Septal Branch  Vessel is moderate in size.  Second Septal Branch  Vessel is small in size.  Third Diagonal Branch  Vessel is small in size.  Third Septal Branch  Vessel is small in size.  Left  Circumflex  Ost Cx to Prox Cx lesion, 0% stenosed. Previously placed Ost Cx to Prox Cx stent (unknown type) is widely patent. Most of the stent has a roughly 10-15% in-stent restenosis.  Prox Cx lesion, 80% stenosed. The lesion is tubular, eccentric and smooth. In-stent restenosis in the distal third of the stent  Angioplasty: Lesion length: 12 mm. Lesion crossed with guidewire. Angioplasty alone was performed using a BALLOON ANGIOSCULPT RX 3.5X10. Maximum pressure: 20 atm. Inflation time: 30 sec. The pre-interventional distal flow is normal (TIMI 3). The post-interventional distal flow is normal (TIMI 3). The intervention was successful . No complications occurred at this lesion.  There is a 15% residual stenosis post intervention.  First Obtuse Marginal Branch  Vessel is moderate in size.  Second Obtuse Marginal Branch  Vessel is angiographically normal.  Third Obtuse Marginal Branch  Vessel is moderate in size. Vessel is angiographically normal.  Right Coronary Artery  Vessel is large.  Ost RCA lesion, 99% stenosed. The lesion is focal.  Ost RCA to Prox RCA lesion, 20% stenosed. The lesion was previously treated using a stent (unknown type) over 2 years ago.  Prox RCA lesion, 80% stenosed.  Mid RCA lesion, 100% stenosed. The lesion is chronically occluded.  Dist RCA lesion, 95% stenosed. Diffuse  Right Posterior Descending Artery  Vessel is large in size. Vessel is angiographically normal.  Inferior Septal  Vessel is small in size.  Right Posterior Atrioventricular Branch  Vessel is angiographically normal.  First Right Posterolateral  Vessel is small in size.  Second Right Posterolateral  Vessel is small in size.  Third Right Posterolateral  Vessel is small in size.  Graft Angiography  saphenous Graft to Dist RCA  SVG and is large and anatomically normal.  Origin lesion, 45% stenosed. The lesion is tubular.  Free LIMA Graft to Dist LAD  LIMA and is normal in caliber, large and  anatomically normal. Nonselective Grade fills the major second diagonal branch.  Coronary Diagrams   Diagnostic Diagram     Post-Intervention Diagram      Conclusion     Culprit Lesion: Prox Cx Stent, 80 %stenosed. In-Stent Re-Stenosis  Post intervention - AngioSculpt PTCA only, there is a 15% residual stenosis. Ost Cx to Prox Cx lesion, 0 %stenosed.  ___________  Colon Flattery RCA lesion, 99 %stenosed. Prox  RCA lesion, 80 %stenosed. Dist RCA lesion, 95 %stenosed. Mid RCA lesion, 100 %stenosed. - CTO  Ost RCA to Prox RCA Stent, 20 %stenosed.  SVG-dRCA and is large and anatomically normal - perfuses both the RPDA & RPL: Origin lesion, 45 %stenosed.  LIMA-LAD and is normal in caliber, large and anatomically normal - retrograde fills major Diag2.  Mid LAD lesion, 100 %stenosed.   Likely culprit lesion is the in-stent restenosis in the circumflex. This is definitely progression of disease from 2015 prior to CABG. Treated successfully with Angioplasty Only - leaving residual 10-50% stenosis which is similar to the remainder of the stented segment.  Patent LIMA-LAD which fills the diagonal branch as well, also patent SVG-distal RCA fills both branches.  Of note the patient had Wenkebach block on EKG post PCI. She also had significant heartburn from the Plavix. This was treated with 20 of Pepcid. She also had some residual angina following angioplasty. Nitroglycerin infusion was initiated.  Plan:  Transfer patient 6 central postprocedure unit.  Standard TR band removal per protocol.  Wean nitroglycerin infusion OVERNIGHT  Treat GERD symptoms with Maalox  Monitor rhythm overnight. Wenkebach block noted post cath.  I'm not sure this lesion explains the patient's palpitations. She may been noticing Wenkebach block.   TTE 05/25/2016: Study Conclusions  - Left ventricle: The cavity size was normal. There was moderate   concentric hypertrophy. Systolic function was normal.  The   estimated ejection fraction was in the range of 60% to 65%. Wall   motion was normal; there were no regional wall motion   abnormalities. Doppler parameters are consistent with abnormal   left ventricular relaxation (grade 1 diastolic dysfunction).   Doppler parameters are consistent with high ventricular filling   pressure. - Aortic valve: A bioprosthesis was present. Transvalvular velocity   was within the normal range. There was no stenosis. Valve area   (VTI): 1.64 cm^2. Valve area (Vmax): 1.51 cm^2. Valve area   (Vmean): 1.77 cm^2. - Mitral valve: Transvalvular velocity was within the normal range.   There was no evidence for stenosis. There was no regurgitation. - Left atrium: The atrium was moderately dilated. - Right ventricle: The cavity size was normal. Wall thickness was   normal. Systolic function was normal. - Right atrium: The atrium was mildly dilated. - Tricuspid valve: There was mild regurgitation. - Pulmonary arteries: Systolic pressure was at the upper limits of   normal. PA peak pressure: 38 mm Hg (S).  Impressions:  - Bioprosthetic aortic valve gradients unchanged from 11/2013. _____________  Disposition   Pt is being discharged home today in good condition.  Follow-up Plans & Appointments    Follow-up Information    [place] Office Follow up.   Specialty:  Cardiology Why:  message has been sent to the office. If patient has not heard from the office b y12/27 she is to call the office.  Contact information: [address] [place] [phone]         Discharge Instructions    Amb Referral to Cardiac Rehabilitation    Complete by:  As directed    Diagnosis:  PTCA      Discharge Medications   Current Discharge Medication List    START taking these medications   Details  metoprolol tartrate (LOPRESSOR) 25 MG tablet Take 0.5 tablets (12.5 mg total) by mouth 2 (two) times  daily. Qty: 60 tablet, Refills: 5    nitroGLYCERIN (NITROLINGUAL) 0.4 MG/SPRAY  spray Place 1 spray under the tongue every 5 (five) minutes x 3 doses as needed for chest pain. Qty: 12 g, Refills: 12    pantoprazole (PROTONIX) 40 MG tablet Take 1 tablet (40 mg total) by mouth daily. Qty: 30 tablet, Refills: 11    !! ticagrelor (BRILINTA) 90 MG TABS tablet Take 1 tablet (90 mg total) by mouth 2 (two) times daily. Qty: 60 tablet, Refills: 0    !! ticagrelor (BRILINTA) 90 MG TABS tablet Take 1 tablet (90 mg total) by mouth 2 (two) times daily. Qty: 60 tablet, Refills: 11     !! - Potential duplicate medications found. Please discuss with provider.    CONTINUE these medications which have NOT CHANGED   Details  albuterol (PROVENTIL HFA;VENTOLIN HFA) 108 (90 Base) MCG/ACT inhaler Inhale 2 puffs into the lungs 2 (two) times daily at 10 AM and 5 PM. Qty: 18 g, Refills: 10   Associated Diagnoses: Chronic obstructive pulmonary disease, unspecified COPD type (HCC)    aspirin EC 81 MG tablet Take 81 mg by mouth daily.    Calcium-Vitamin D (CALTRATE 600 PLUS-VIT D PO) Take 2 tablets by mouth 2 (two) times daily.     FLUoxetine (PROZAC) 40 MG capsule Take 40 mg by mouth 2 (two) times daily.    Insulin Lispro Prot & Lispro (HUMALOG MIX 75/25 KWIKPEN) (75-25) 100 UNIT/ML Kwikpen Inject 70 Units into the skin 2 (two) times daily.     levothyroxine (SYNTHROID, LEVOTHROID) 150 MCG tablet Take 150 mcg by mouth daily before breakfast.    traZODone (DESYREL) 100 MG tablet TAKE TWO TABLETS BY MOUTH AT BEDTIME Qty: 60 tablet, Refills: 0    ONETOUCH DELICA LANCETS 76O MISC USE TO CHECK BLOOD SUGAR THREE TIMES DAILY      STOP taking these medications     esomeprazole (NEXIUM) 40 MG capsule      nitroGLYCERIN (NITROSTAT) 0.4 MG SL tablet          Aspirin prescribed at discharge?  Yes High Intensity Statin Prescribed? (Lipitor 40-44m or Crestor 20-441m: No: Intolerant Beta Blocker Prescribed?  Yes For EF <40%, was ACEI/ARB Prescribed? No: EF > 40% ADP Receptor Inhibitor Prescribed? (i.e. Plavix etc.-Includes Medically Managed Patients): Yes For EF <40%, Aldosterone Inhibitor Prescribed? No: As above Was EF assessed during THIS hospitalization? Yes Was Cardiac Rehab II ordered? (Included Medically managed Patients): Yes   Outstanding Labs/Studies   none  Duration of Discharge Encounter   Greater than 30 minutes including physician time.  Signed, RyRise MuPA-C [facility] Pager: [phone]/23/2017, 11:38 AM

## 2016-05-26 NOTE — ED Notes (Signed)
Pt denies CP. Pt states her breathing is what's hurting.

## 2016-05-26 NOTE — ED Triage Notes (Signed)
p states "i had a heart cath yesterday and I went home today and I got to feeling really weak and hard to breath, and I feel like im panicking. I had pain in my left side of my chest when I got home." Pt is breathing nearly 30 times a minute. Denies pain at this moment, just trouble breathing. Saturations 100% on room air.

## 2016-05-26 NOTE — ED Triage Notes (Signed)
Lab called a troponin .0.29  drcardoma infromed  He already knew

## 2016-05-26 NOTE — ED Provider Notes (Signed)
[facility] DEPT Provider Note   CSN: [id number] Arrival date & time: 05/26/16  2102     History   Chief Complaint Chief Complaint  Patient presents with  . Shortness of Breath    HPI [name] is a 70 y.o. female.  The history is provided by the patient.  Shortness of Breath  This is a new problem. The average episode lasts 4 hours. The problem occurs continuously.The current episode started 3 to 5 hours ago. The problem has been gradually improving. Associated symptoms include chest pain (left sided; pressure-like). Pertinent negatives include no fever, no rhinorrhea and no sputum production. She has tried nothing for the symptoms. Associated medical issues include asthma, COPD, CAD (was just DC'd today after angioplasty for stent restenosis) and past MI (s/p stenting and CABG).  Chest Pain   This is a recurrent problem. The current episode started 3 to 5 hours ago. Progression since onset: intermittent. The pain is present in the substernal region. The pain is moderate. The quality of the pain is described as exertional. The pain does not radiate. Associated symptoms include nausea and shortness of breath. Pertinent negatives include no back pain, no diaphoresis, no fever, no lower extremity edema and no sputum production.  Her past medical history is significant for CAD (was just DC'd today after angioplasty for stent restenosis) and MI (s/p stenting and CABG).    Past Medical History:  Diagnosis Date  . ACE-inhibitor cough   . Allergic rhinitis   . Anemia    iron deficiency anemia  . Anxiety   . Arthritis   . Asthma   . Cataract   . Chronic depression   . Chronic headache   . COPD (chronic obstructive pulmonary disease) ([place])   . Coronary artery disease    a. s/p CABG x2 (LIMA-LAD and SVG-RCA)  b. NST neg for ischemia 07/24/14  . Diabetes mellitus    type 2  . Diverticulitis of colon   . GERD (gastroesophageal reflux disease)   . H/O aortic valve replacement     . Hearing loss   . History of blood transfusion 2013  . HOH (hard of hearing)   . Hypercholesterolemia    intolerance of statins and niaspan  . OSA (obstructive sleep apnea)    mild, intolerant of cpap  . PONV (postoperative nausea and vomiting)   . Thyroid disease     Patient Active Problem List   Diagnosis Date Noted  . Unstable angina ([place]) 05/25/2016  . Palpitations 05/25/2016  . PUD - recent Rx for H.Pylori 05/25/2016  . Stenosis of coronary artery stent   . Recurrent major depressive disorder, in partial remission ([place]) 07/29/2015  . Parotiditis 04/25/2015  . Viral URI 03/24/2015  . Hypoglycemia 08/25/2014  . Nausea without vomiting 08/25/2014  . Dyslipidemia-statin ontol   . Long-term insulin use ([place]) 03/31/2014  . Aortic valve disorder 11/11/2013  . 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 Coronary Artery Disease s/p CABG 10/2013 10/10/2013  . Other malaise and fatigue 09/11/2013  . Alopecia 03/06/2013  . Insomnia 02/06/2013  . Gastroesophageal reflux disease without esophagitis 01/19/2013  . Dysphagia, unspecified(787.20) 01/19/2013  . Dyspnea 11/17/2012  . Weakness 11/17/2012  . Abnormal MRI of head 09/05/2012  . Depression with anxiety 03/21/2012  . Extrinsic asthma 11/12/2011  . Anemia 06/07/2011  . Chronic obstructive pulmonary disease ([place]) 01/25/2010  . Acquired hypothyroidism 08/10/2009  . MICROSCOPIC HEMATURIA 05/07/2009  . GOITER 03/02/2009  . Essential hypertension 01/06/2009  . MEMORY  LOSS 06/23/2008  . Obstructive sleep apnea 12/25/2007  . Insulin dependent diabetes mellitus ([place]) 09/09/2006  . ALLERGIC RHINITIS 09/09/2006  . DIVERTICULOSIS, COLON 09/09/2006    Past Surgical History:  Procedure Laterality Date  . ABDOMINAL HYSTERECTOMY    . ABDOMINAL HYSTERECTOMY W/ PARTIAL VAGINACTOMY    . AORTIC VALVE REPLACEMENT N/A 10/12/2013   Procedure: AORTIC VALVE REPLACEMENT (AVR);  Surgeon: [name], MD;  Location: [facility];  Service: Open Heart  Surgery;  Laterality: N/A;  . [place]  . BARTHOLIN GLAND CYST EXCISION    . BLADDER SUSPENSION    . BREAST BIOPSY Bilateral 09/11/2000   neg  . BREAST BIOPSY Left 07/24/2010   neg  . BREAST CYST EXCISION  1988   bilateral nonmalignant tumors, x3  . CARDIAC CATHETERIZATION    . CATARACT EXTRACTION W/ INTRAOCULAR LENS  IMPLANT, BILATERAL    . CHOLECYSTECTOMY  2001  . COLECTOMY     lap sigmoid  . COLONOSCOPY  2014   polyps found, 2 clamped off.  . CORONARY ANGIOPLASTY  10/29/2007   Prox RCA & Mid Cx.  . CORONARY ARTERY BYPASS GRAFT N/A 10/12/2013   Procedure: CORONARY ARTERY BYPASS GRAFT TIMES TWO;  Surgeon: [name], MD;  Location: [facility];  Service: Open Heart Surgery;  Laterality: N/A;  . LEFT HEART CATHETERIZATION WITH CORONARY ANGIOGRAM N/A 10/09/2013   Procedure: LEFT HEART CATHETERIZATION WITH CORONARY ANGIOGRAM;  Surgeon: [name], MD;  Location[facility] CATH LAB;  Service: Cardiovascular;  Laterality: N/A;  . STERNAL WIRES REMOVAL N/A 04/13/2014   Procedure: STERNAL WIRES REMOVAL;  Surgeon: [name], MD;  Location: [facility] OR;  Service: Thoracic;  Laterality: N/A;  . THYROIDECTOMY    . TONSILLECTOMY    . TUBAL LIGATION    . VAGINAL DELIVERY     3    OB History    Gravida Para Term Preterm AB Living   3 3       3    SAB TAB Ectopic Multiple Live Births                  Obstetric Comments   1st Menstrual Cycle: 10 1st Pregnancy: 19       Home Medications    Prior to Admission medications   Medication Sig Start Date End Date Taking? Authorizing Provider  albuterol (PROVENTIL HFA;VENTOLIN HFA) 108 (90 Base) MCG/ACT inhaler Inhale 2 puffs into the lungs 2 (two) times daily at 10 AM and 5 PM. Patient taking differently: Inhale 2 puffs into the lungs every 6 (six) hours as needed for wheezing or shortness of breath.  07/22/15  Yes Flora Lipps, MD  aspirin EC 81 MG tablet Take 81 mg by mouth daily.   Yes Historical Provider, MD  Calcium-Vitamin D  (CALTRATE 600 PLUS-VIT D PO) Take 2 tablets by mouth 2 (two) times daily.    Yes Historical Provider, MD  FLUoxetine (PROZAC) 40 MG capsule Take 40 mg by mouth 2 (two) times daily. 07/29/15  Yes Historical Provider, MD  insulin NPH-regular Human (NOVOLIN 70/30) (70-30) 100 UNIT/ML injection Inject 70 Units into the skin 2 (two) times daily.   Yes Historical Provider, MD  levothyroxine (SYNTHROID, LEVOTHROID) 150 MCG tablet Take 150 mcg by mouth daily before breakfast.   Yes Historical Provider, MD  nitroGLYCERIN (NITROSTAT) 0.4 MG SL tablet Place 0.4 mg under the tongue every 5 (five) minutes as needed for chest pain.   Yes Historical Provider, MD  [facility] LANCETS  33G MISC USE TO CHECK BLOOD SUGAR THREE TIMES DAILY 05/17/14  Yes Historical Provider, MD  pantoprazole (PROTONIX) 40 MG tablet Take 1 tablet (40 mg total) by mouth daily. Patient taking differently: Take 40 mg by mouth 2 (two) times daily.  05/27/16  Yes Ryan M Dunn, PA-C  ticagrelor (BRILINTA) 90 MG TABS tablet Take 1 tablet (90 mg total) by mouth 2 (two) times daily. 05/26/16  Yes Ryan M Dunn, PA-C  traZODone (DESYREL) 100 MG tablet TAKE TWO TABLETS BY MOUTH AT BEDTIME 08/15/15  Yes Lucille Passy, MD  metoprolol tartrate (LOPRESSOR) 25 MG tablet Take 0.5 tablets (12.5 mg total) by mouth 2 (two) times daily. Patient not taking: Reported on 05/26/2016 05/26/16   Areta Haber Dunn, PA-C  nitroGLYCERIN (NITROLINGUAL) 0.4 MG/SPRAY spray Place 1 spray under the tongue every 5 (five) minutes x 3 doses as needed for chest pain. Patient not taking: Reported on 05/26/2016 05/26/16   Rise Mu, PA-C    Family History Family History  Problem Relation Age of Onset  . Breast cancer Mother 12  . Hypertension Father   . Mesothelioma Father   . Asthma Father   . Stroke Paternal Grandfather   . Heart disease    . Breast cancer Maternal Aunt   . Breast cancer Paternal Aunt     Social History Social History  Substance Use Topics  . Smoking  status: Former Smoker    Packs/day: 0.50    Years: 30.00    Types: Cigarettes    Quit date: 10/02/2013  . Smokeless tobacco: Never Used  . Alcohol use No     Allergies   Amitriptyline; Benadryl [diphenhydramine]; Demerol [meperidine]; Gabapentin; Loratadine; Meperidine hcl; Mirtazapine; Olanzapine; Voltaren [diclofenac sodium]; Zetia [ezetimibe]; Ativan [lorazepam]; Atorvastatin; Budesonide-formoterol fumarate; Bupropion hcl; Caffeine; Codeine sulfate; Lisinopril; Metformin; Mometasone furoate; Morphine sulfate; Other; Oxycodone-acetaminophen; Pioglitazone; Propoxyphene n-acetaminophen; Rosuvastatin; Shellfish allergy; Tramadol; Venlafaxine; Zolpidem tartrate; and Latex   Review of Systems Review of Systems  Constitutional: Negative for diaphoresis and fever.  HENT: Negative for rhinorrhea.   Respiratory: Positive for shortness of breath. Negative for sputum production.   Cardiovascular: Positive for chest pain (left sided; pressure-like).  Gastrointestinal: Positive for nausea.  Musculoskeletal: Negative for back pain.  Ten systems are reviewed and are negative for acute change except as noted in the HPI    Physical Exam Updated Vital Signs BP 162/66   Pulse 60   Temp 98 F (36.7 C) (Oral)   Resp (!) 28   SpO2 100%   Physical Exam  Constitutional: She is oriented to person, place, and time. She appears well-developed and well-nourished. No distress.  HENT:  Head: Normocephalic and atraumatic.  Nose: Nose normal.  Eyes: Conjunctivae and EOM are normal. Pupils are equal, round, and reactive to light. Right eye exhibits no discharge. Left eye exhibits no discharge. No scleral icterus.  Neck: Normal range of motion. Neck supple.  Cardiovascular: Normal rate and regular rhythm.  Exam reveals no gallop and no friction rub.   No murmur heard. Pulmonary/Chest: Effort normal and breath sounds normal. No stridor. No respiratory distress. She has no rales.  Abdominal: Soft. She  exhibits no distension. There is no tenderness.  Musculoskeletal: She exhibits no edema or tenderness.  Neurological: She is alert and oriented to person, place, and time.  Skin: Skin is warm and dry. No rash noted. She is not diaphoretic. No erythema.  Psychiatric: She has a normal mood and affect.  Vitals reviewed.    ED Treatments /  Results  Labs (all labs ordered are listed, but only abnormal results are displayed) Labs Reviewed  BASIC METABOLIC PANEL - Abnormal; Notable for the following:       Result Value   Sodium 131 (*)    Chloride 97 (*)    Glucose, Bld 133 (*)    Calcium 8.4 (*)    All other components within normal limits  CBC - Abnormal; Notable for the following:    Hemoglobin 11.5 (*)    HCT 35.3 (*)    All other components within normal limits  BRAIN NATRIURETIC PEPTIDE - Abnormal; Notable for the following:    B Natriuretic Peptide 328.4 (*)    All other components within normal limits  TROPONIN I - Abnormal; Notable for the following:    Troponin I 0.28 (*)    All other components within normal limits  I-STAT TROPOININ, ED - Abnormal; Notable for the following:    Troponin i, poc 0.29 (*)    All other components within normal limits    EKG  EKG Interpretation  Date/Time:  Saturday May 26 2016 22:56:51 EST Ventricular Rate:  59 PR Interval:  244 QRS Duration: 108 QT Interval:  472 QTC Calculation: 468 R Axis:   2 Text Interpretation:  Sinus rhythm Prolonged PR interval Baseline wander in lead(s) II III aVR aVF When compared with ECG of EARLIER SAME DATE No significant change was found Confirmed by [facility]  MD, DAVID ([id number]) on 05/26/2016 10:59:58 PM       Radiology Dg Chest 2 View  Result Date: 05/26/2016 CLINICAL DATA:  Onset weakness and difficulty breathing this morning. Status post cardiac catheterization yesterday. EXAM: CHEST  2 VIEW COMPARISON:  PA and lateral chest 05/24/2016.  CT chest 07/19/2015. FINDINGS: The patient is status post  CABG and aortic valve replacement. There is mild cardiomegaly. Lungs are clear. No pneumothorax or pleural effusion. IMPRESSION: No acute disease. Electronically Signed   By: Inge Rise M.D.   On: 05/26/2016 21:30    Procedures Procedures (including critical care time)  Medications Ordered in ED Medications  nitroGLYCERIN (NITROSTAT) SL tablet 0.4 mg (0.4 mg Sublingual Given 05/26/16 2237)  aspirin chewable tablet 324 mg (324 mg Oral Given 05/26/16 2237)  diazepam (VALIUM) tablet 2 mg (2 mg Oral Given 05/26/16 2355)  fentaNYL (SUBLIMAZE) injection 50 mcg (50 mcg Intravenous Given 05/26/16 2357)     Initial Impression / Assessment and Plan / ED Course  I have reviewed the triage vital signs and the nursing notes.  Pertinent labs & imaging results that were available during my care of the patient were reviewed by me and considered in my medical decision making (see chart for details).  Clinical Course as of May 27 45  Sat May 26, 2016  2310 EKG without acute ischemia or evidence of pericarditis. Chest x-ray without evidence suggestive of pneumonia, pneumothorax, pneumomediastinum.  No abnormal contour of the mediastinum to suggest dissection. No evidence of acute injuries. Initial troponin elevated at 0.29. Patient given 324 of aspirin. Discussed case with cardiology who believe that her symptoms are associated to being on ticagrelor. They believe the troponin elevation is likely secondary to instrumentation from the patient's angioplasty. They requested that patient be admitted to hospitalist observation for transient and troponin. They requested the patient not be started on heparin.  Low pretest probability for pulmonary embolism. Presentation not classic for aortic dissection. Low suspicion for esophageal perforation.  [PC]  2320 Discussed case with hospitalist service who will admit.  [  PC]    Clinical Course User Index [PC] Fatima Blank, MD      Final Clinical  Impressions(s) / ED Diagnoses   Final diagnoses:  Shortness of breath  Atypical chest pain      Fatima Blank, MD 05/27/16 [id number]

## 2016-05-26 NOTE — Progress Notes (Signed)
CM gave pt Brilinta free 30 day trial card.  Pt verbalized undrestanding this card will pay for today's prescription and give insurance time for authorization to cover the refills.  No other CM needs were communicated.

## 2016-05-26 NOTE — Progress Notes (Signed)
Subjective:  No CP/SOB S/P LCX PCI/ Angiosculpt for ISR, Wenckebach on tele  Objective:  Temp:  [97.2 F (36.2 C)-97.6 F (36.4 C)] 97.6 F (36.4 C) (12/23 0800) Pulse Rate:  [0-67] 56 (12/23 0211) Resp:  [0-47] 17 (12/23 0800) BP: (110-158)/(50-94) 151/53 (12/23 0800) SpO2:  [0 %-99 %] 95 % (12/23 0800) Weight:  [192 lb 9.6 oz (87.4 kg)] 192 lb 9.6 oz (87.4 kg) (12/23 0211) Weight change: -5 lb 6.4 oz (-2.449 kg)  Intake/Output from previous day: 12/22 0701 - 12/23 0700 In: 244.9 [P.O.:50; I.V.:194.9] Out: 400 [Urine:400]  Intake/Output from this shift: No intake/output data recorded.  Physical Exam: General appearance: alert and no distress Neck: no adenopathy, no carotid bruit, no JVD, supple, symmetrical, trachea midline and thyroid not enlarged, symmetric, no tenderness/mass/nodules Lungs: clear to auscultation bilaterally Heart: soft outflow tract murmur Extremities: extremities normal, atraumatic, no cyanosis or edema  Lab Results: Results for orders placed or performed during the hospital encounter of 05/24/16 (from the past 48 hour(s))  Basic metabolic panel     Status: Abnormal   Collection Time: 05/24/16  8:23 PM  Result Value Ref Range   Sodium 136 135 - 145 mmol/L   Potassium 4.2 3.5 - 5.1 mmol/L   Chloride 103 101 - 111 mmol/L   CO2 26 22 - 32 mmol/L   Glucose, Bld 209 (H) 65 - 99 mg/dL   BUN 12 6 - 20 mg/dL   Creatinine, Ser 0.85 0.44 - 1.00 mg/dL   Calcium 8.8 (L) 8.9 - 10.3 mg/dL   GFR calc non Af Amer >60 >60 mL/min   GFR calc Af Amer >60 >60 mL/min    Comment: (NOTE) The eGFR has been calculated using the CKD EPI equation. This calculation has not been validated in all clinical situations. eGFR's persistently <60 mL/min signify possible Chronic Kidney Disease.    Anion gap 7 5 - 15  CBC     Status: Abnormal   Collection Time: 05/24/16  8:23 PM  Result Value Ref Range   WBC 11.5 (H) 4.0 - 10.5 K/uL   RBC 4.30 3.87 - 5.11 MIL/uL   Hemoglobin 11.6 (L) 12.0 - 15.0 g/dL   HCT 35.5 (L) 36.0 - 46.0 %   MCV 82.6 78.0 - 100.0 fL   MCH 27.0 26.0 - 34.0 pg   MCHC 32.7 30.0 - 36.0 g/dL   RDW 14.8 11.5 - 15.5 %   Platelets 206 150 - 400 K/uL  I-stat troponin, ED     Status: None   Collection Time: 05/24/16  8:47 PM  Result Value Ref Range   Troponin i, poc 0.00 0.00 - 0.08 ng/mL   Comment 3            Comment: Due to the release kinetics of cTnI, a negative result within the first hours of the onset of symptoms does not rule out myocardial infarction with certainty. If myocardial infarction is still suspected, repeat the test at appropriate intervals.   I-Stat Troponin, ED (not at [facility])     Status: None   Collection Time: 05/24/16 11:23 PM  Result Value Ref Range   Troponin i, poc 0.00 0.00 - 0.08 ng/mL   Comment 3            Comment: Due to the release kinetics of cTnI, a negative result within the first hours of the onset of symptoms does not rule out myocardial infarction with certainty. If myocardial infarction is still suspected, repeat  the test at appropriate intervals.   TSH     Status: None   Collection Time: 05/25/16 12:35 AM  Result Value Ref Range   TSH 1.873 0.350 - 4.500 uIU/mL    Comment: Performed by a 3rd Generation assay with a functional sensitivity of <=0.01 uIU/mL.  Troponin I     Status: None   Collection Time: 05/25/16 12:35 AM  Result Value Ref Range   Troponin I <0.03 <0.03 ng/mL  Hemoglobin A1c     Status: Abnormal   Collection Time: 05/25/16 12:35 AM  Result Value Ref Range   Hgb A1c MFr Bld 6.9 (H) 4.8 - 5.6 %    Comment: (NOTE)         Pre-diabetes: 5.7 - 6.4         Diabetes: >6.4         Glycemic control for adults with diabetes: <7.0    Mean Plasma Glucose 151 mg/dL    Comment: (NOTE) Performed At: [facility] [place], [place] [id number] Lindon Romp MD ZH:[id number]   CK total and CKMB (cardiac)not at [facility]     Status: None   Collection  Time: 05/25/16 12:35 AM  Result Value Ref Range   Total CK 65 38 - 234 U/L   CK, MB 2.1 0.5 - 5.0 ng/mL   Relative Index RELATIVE INDEX IS INVALID 0.0 - 2.5    Comment: WHEN CK < 100 U/L          Heparin level (unfractionated)     Status: Abnormal   Collection Time: 05/25/16  6:50 AM  Result Value Ref Range   Heparin Unfractionated <0.10 (L) 0.30 - 0.70 IU/mL    Comment:        IF HEPARIN RESULTS ARE BELOW EXPECTED VALUES, AND PATIENT DOSAGE HAS BEEN CONFIRMED, SUGGEST FOLLOW UP TESTING OF ANTITHROMBIN III LEVELS. SPECIMEN CHECKED FOR CLOTS REPEATED TO VERIFY   Basic metabolic panel     Status: Abnormal   Collection Time: 05/25/16  6:50 AM  Result Value Ref Range   Sodium 138 135 - 145 mmol/L   Potassium 4.2 3.5 - 5.1 mmol/L   Chloride 103 101 - 111 mmol/L   CO2 26 22 - 32 mmol/L   Glucose, Bld 107 (H) 65 - 99 mg/dL   BUN 10 6 - 20 mg/dL   Creatinine, Ser 0.88 0.44 - 1.00 mg/dL   Calcium 8.9 8.9 - 10.3 mg/dL   GFR calc non Af Amer >60 >60 mL/min   GFR calc Af Amer >60 >60 mL/min    Comment: (NOTE) The eGFR has been calculated using the CKD EPI equation. This calculation has not been validated in all clinical situations. eGFR's persistently <60 mL/min signify possible Chronic Kidney Disease.    Anion gap 9 5 - 15  T4, free     Status: None   Collection Time: 05/25/16  6:50 AM  Result Value Ref Range   Free T4 0.94 0.61 - 1.12 ng/dL    Comment: (NOTE) Biotin ingestion may interfere with free T4 tests. If the results are inconsistent with the TSH level, previous test results, or the clinical presentation, then consider biotin interference. If needed, order repeat testing after stopping biotin.   Troponin I     Status: None   Collection Time: 05/25/16  6:50 AM  Result Value Ref Range   Troponin I <0.03 <0.03 ng/mL  CBC WITH DIFFERENTIAL     Status: Abnormal   Collection Time: 05/25/16  6:50 AM  Result Value Ref Range   WBC 9.1 4.0 - 10.5 K/uL   RBC 4.33 3.87 -  5.11 MIL/uL   Hemoglobin 11.4 (L) 12.0 - 15.0 g/dL   HCT 35.8 (L) 36.0 - 46.0 %   MCV 82.7 78.0 - 100.0 fL   MCH 26.3 26.0 - 34.0 pg   MCHC 31.8 30.0 - 36.0 g/dL   RDW 14.8 11.5 - 15.5 %   Platelets 210 150 - 400 K/uL   Neutrophils Relative % 70 %   Neutro Abs 6.3 1.7 - 7.7 K/uL   Lymphocytes Relative 20 %   Lymphs Abs 1.8 0.7 - 4.0 K/uL   Monocytes Relative 8 %   Monocytes Absolute 0.7 0.1 - 1.0 K/uL   Eosinophils Relative 2 %   Eosinophils Absolute 0.2 0.0 - 0.7 K/uL   Basophils Relative 0 %   Basophils Absolute 0.0 0.0 - 0.1 K/uL  Protime-INR     Status: None   Collection Time: 05/25/16  6:50 AM  Result Value Ref Range   Prothrombin Time 14.4 11.4 - 15.2 seconds   INR 1.12   CK total and CKMB (cardiac)not at [facility]     Status: None   Collection Time: 05/25/16  6:50 AM  Result Value Ref Range   Total CK 61 38 - 234 U/L   CK, MB 1.6 0.5 - 5.0 ng/mL   Relative Index RELATIVE INDEX IS INVALID 0.0 - 2.5    Comment: WHEN CK < 100 U/L          Troponin I     Status: None   Collection Time: 05/25/16 12:55 PM  Result Value Ref Range   Troponin I <0.03 <0.03 ng/mL  CK total and CKMB (cardiac)not at [facility]     Status: None   Collection Time: 05/25/16 12:55 PM  Result Value Ref Range   Total CK 56 38 - 234 U/L   CK, MB 1.3 0.5 - 5.0 ng/mL   Relative Index RELATIVE INDEX IS INVALID 0.0 - 2.5    Comment: WHEN CK < 100 U/L          POCT Activated clotting time     Status: None   Collection Time: 05/25/16  2:55 PM  Result Value Ref Range   Activated Clotting Time 241 seconds  POCT Activated clotting time     Status: None   Collection Time: 05/25/16  3:12 PM  Result Value Ref Range   Activated Clotting Time 334 seconds  Glucose, capillary     Status: Abnormal   Collection Time: 05/25/16  4:13 PM  Result Value Ref Range   Glucose-Capillary 123 (H) 65 - 99 mg/dL  CK total and CKMB (cardiac)not at [facility]     Status: None   Collection Time: 05/25/16  6:52 PM  Result Value Ref Range    Total CK 64 38 - 234 U/L   CK, MB 1.7 0.5 - 5.0 ng/mL   Relative Index RELATIVE INDEX IS INVALID 0.0 - 2.5    Comment: WHEN CK < 100 U/L          Glucose, capillary     Status: Abnormal   Collection Time: 05/25/16  8:45 PM  Result Value Ref Range   Glucose-Capillary 157 (H) 65 - 99 mg/dL  CK total and CKMB (cardiac)not at [facility]     Status: Abnormal   Collection Time: 05/26/16  1:01 AM  Result Value Ref Range   Total CK 91 38 - 234 U/L  CK, MB 5.5 (H) 0.5 - 5.0 ng/mL   Relative Index RELATIVE INDEX IS INVALID 0.0 - 2.5    Comment: WHEN CK < 100 U/L          Glucose, capillary     Status: Abnormal   Collection Time: 05/26/16  6:08 AM  Result Value Ref Range   Glucose-Capillary 145 (H) 65 - 99 mg/dL  Basic metabolic panel     Status: Abnormal   Collection Time: 05/26/16  6:38 AM  Result Value Ref Range   Sodium 138 135 - 145 mmol/L   Potassium 4.4 3.5 - 5.1 mmol/L   Chloride 104 101 - 111 mmol/L   CO2 27 22 - 32 mmol/L   Glucose, Bld 144 (H) 65 - 99 mg/dL   BUN 11 6 - 20 mg/dL   Creatinine, Ser 0.91 0.44 - 1.00 mg/dL   Calcium 8.2 (L) 8.9 - 10.3 mg/dL   GFR calc non Af Amer >60 >60 mL/min   GFR calc Af Amer >60 >60 mL/min    Comment: (NOTE) The eGFR has been calculated using the CKD EPI equation. This calculation has not been validated in all clinical situations. eGFR's persistently <60 mL/min signify possible Chronic Kidney Disease.    Anion gap 7 5 - 15  CBC WITH DIFFERENTIAL     Status: Abnormal   Collection Time: 05/26/16  6:38 AM  Result Value Ref Range   WBC 8.8 4.0 - 10.5 K/uL   RBC 3.99 3.87 - 5.11 MIL/uL   Hemoglobin 10.8 (L) 12.0 - 15.0 g/dL   HCT 33.8 (L) 36.0 - 46.0 %   MCV 84.7 78.0 - 100.0 fL   MCH 27.1 26.0 - 34.0 pg   MCHC 32.0 30.0 - 36.0 g/dL   RDW 15.4 11.5 - 15.5 %   Platelets 186 150 - 400 K/uL   Neutrophils Relative % 75 %   Neutro Abs 6.6 1.7 - 7.7 K/uL   Lymphocytes Relative 16 %   Lymphs Abs 1.4 0.7 - 4.0 K/uL   Monocytes Relative 7  %   Monocytes Absolute 0.6 0.1 - 1.0 K/uL   Eosinophils Relative 2 %   Eosinophils Absolute 0.2 0.0 - 0.7 K/uL   Basophils Relative 0 %   Basophils Absolute 0.0 0.0 - 0.1 K/uL  Platelet inhibition p2y12 (Not at [facility])     Status: None   Collection Time: 05/26/16  6:38 AM  Result Value Ref Range   Platelet Function  P2Y12 301 194 - 418 PRU    Comment:        The literature has shown a direct correlation of PRU values over 230 with higher risks of thrombotic events.  Lower PRU values are associated with platelet inhibition.   CK total and CKMB (cardiac)not at [facility]     Status: Abnormal   Collection Time: 05/26/16  6:38 AM  Result Value Ref Range   Total CK 101 38 - 234 U/L   CK, MB 8.6 (H) 0.5 - 5.0 ng/mL   Relative Index 8.5 (H) 0.0 - 2.5    Imaging: Imaging results have been reviewed  Tele- NSR with Wenckebach   Assessment/Plan:   1. Active Problems: 2.   Insulin dependent diabetes mellitus ([place]) 3.   Obstructive sleep apnea 4.   Essential hypertension 5.   Depression with anxiety 6.   Coronary Artery Disease s/p CABG 10/2013 7.   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 8.   Chest pain with high risk of acute coronary syndrome  9.   Dyslipidemia-statin ontol 10.   Palpitations 11.   PUD - recent Rx for H.Pylori 12.   Stenosis of coronary artery stent 13.   Time Spent Directly with Patient:  20  minutes  Length of Stay:  LOS: 2 days   S/P LHC via Left radial approach by Dr Ellyn Hack . Patent grafts, 80% ISR LCX stent. PCI/ Angiosculpt successfully. No CP. 2D shows nl functioning Ao Bioprosthesis with Nl LV fxn. She has Wenckebach on tele. Exam benign. Labs OK. On DAPT. OK for DC home. Change Nexium to protonix. ROV with Dr Julianne Handler 2-3 weeks.  [name] 05/26/2016, 9:05 AM

## 2016-05-27 ENCOUNTER — Encounter (HOSPITAL_COMMUNITY): Payer: Self-pay | Admitting: Emergency Medicine

## 2016-05-27 DIAGNOSIS — T82855D Stenosis of coronary artery stent, subsequent encounter: Secondary | ICD-10-CM | POA: Diagnosis not present

## 2016-05-27 DIAGNOSIS — R0789 Other chest pain: Secondary | ICD-10-CM | POA: Diagnosis not present

## 2016-05-27 DIAGNOSIS — R0600 Dyspnea, unspecified: Secondary | ICD-10-CM

## 2016-05-27 DIAGNOSIS — I214 Non-ST elevation (NSTEMI) myocardial infarction: Secondary | ICD-10-CM

## 2016-05-27 DIAGNOSIS — I2510 Atherosclerotic heart disease of native coronary artery without angina pectoris: Secondary | ICD-10-CM | POA: Diagnosis not present

## 2016-05-27 DIAGNOSIS — I4430 Unspecified atrioventricular block: Secondary | ICD-10-CM

## 2016-05-27 DIAGNOSIS — D509 Iron deficiency anemia, unspecified: Secondary | ICD-10-CM | POA: Diagnosis not present

## 2016-05-27 DIAGNOSIS — J449 Chronic obstructive pulmonary disease, unspecified: Secondary | ICD-10-CM | POA: Diagnosis not present

## 2016-05-27 DIAGNOSIS — I441 Atrioventricular block, second degree: Secondary | ICD-10-CM | POA: Diagnosis not present

## 2016-05-27 DIAGNOSIS — I252 Old myocardial infarction: Secondary | ICD-10-CM | POA: Diagnosis not present

## 2016-05-27 LAB — BASIC METABOLIC PANEL
Anion gap: 9 (ref 5–15)
BUN: 11 mg/dL (ref 6–20)
CALCIUM: 8.4 mg/dL — AB (ref 8.9–10.3)
CO2: 23 mmol/L (ref 22–32)
Chloride: 104 mmol/L (ref 101–111)
Creatinine, Ser: 0.79 mg/dL (ref 0.44–1.00)
Glucose, Bld: 191 mg/dL — ABNORMAL HIGH (ref 65–99)
POTASSIUM: 4 mmol/L (ref 3.5–5.1)
Sodium: 136 mmol/L (ref 135–145)

## 2016-05-27 LAB — CBC
HEMATOCRIT: 32.2 % — AB (ref 36.0–46.0)
Hemoglobin: 10.3 g/dL — ABNORMAL LOW (ref 12.0–15.0)
MCH: 26.2 pg (ref 26.0–34.0)
MCHC: 32 g/dL (ref 30.0–36.0)
MCV: 81.9 fL (ref 78.0–100.0)
PLATELETS: 186 10*3/uL (ref 150–400)
RBC: 3.93 MIL/uL (ref 3.87–5.11)
RDW: 14.8 % (ref 11.5–15.5)
WBC: 10 10*3/uL (ref 4.0–10.5)

## 2016-05-27 LAB — GLUCOSE, CAPILLARY
Glucose-Capillary: 134 mg/dL — ABNORMAL HIGH (ref 65–99)
Glucose-Capillary: 185 mg/dL — ABNORMAL HIGH (ref 65–99)
Glucose-Capillary: 223 mg/dL — ABNORMAL HIGH (ref 65–99)

## 2016-05-27 LAB — TROPONIN I
TROPONIN I: 0.21 ng/mL — AB (ref <0.03)
TROPONIN I: 0.18 ng/mL — AB (ref <0.03)

## 2016-05-27 LAB — MAGNESIUM: Magnesium: 1.9 mg/dL (ref 1.7–2.4)

## 2016-05-27 MED ORDER — PANTOPRAZOLE SODIUM 40 MG PO TBEC
40.0000 mg | DELAYED_RELEASE_TABLET | Freq: Two times a day (BID) | ORAL | Status: DC
  Administered 2016-05-27: 40 mg via ORAL
  Filled 2016-05-27: qty 1

## 2016-05-27 MED ORDER — ENOXAPARIN SODIUM 40 MG/0.4ML ~~LOC~~ SOLN: 40.0000 mg | Freq: Every day | SUBCUTANEOUS | Status: DC

## 2016-05-27 MED ORDER — INSULIN ASPART 100 UNIT/ML ~~LOC~~ SOLN: 0.0000 [IU] | Freq: Every day | SUBCUTANEOUS | Status: DC

## 2016-05-27 MED ORDER — NITROGLYCERIN 0.4 MG/SPRAY TL SOLN: 1.0000 | Status: DC | PRN

## 2016-05-27 MED ORDER — NITROGLYCERIN 0.4 MG SL SUBL
0.4000 mg | SUBLINGUAL_TABLET | SUBLINGUAL | Status: DC | PRN
Start: 2016-05-27 — End: 2016-05-27

## 2016-05-27 MED ORDER — LEVOTHYROXINE SODIUM 75 MCG PO TABS
150.0000 ug | ORAL_TABLET | Freq: Every day | ORAL | Status: DC
  Administered 2016-05-27: 150 ug via ORAL
  Filled 2016-05-27: qty 2

## 2016-05-27 MED ORDER — TICAGRELOR 90 MG PO TABS
90.0000 mg | ORAL_TABLET | Freq: Two times a day (BID) | ORAL | Status: DC
  Filled 2016-05-27: qty 1

## 2016-05-27 MED ORDER — TRAZODONE HCL 100 MG PO TABS
200.0000 mg | ORAL_TABLET | Freq: Every day | ORAL | Status: DC
  Administered 2016-05-27: 200 mg via ORAL
  Filled 2016-05-27 (×2): qty 2

## 2016-05-27 MED ORDER — ALPRAZOLAM 0.5 MG PO TABS: 0.5000 mg | ORAL_TABLET | Freq: Two times a day (BID) | ORAL | Status: DC | PRN

## 2016-05-27 MED ORDER — ALBUTEROL SULFATE (2.5 MG/3ML) 0.083% IN NEBU: 2.5000 mg | INHALATION_SOLUTION | Freq: Four times a day (QID) | RESPIRATORY_TRACT | Status: DC | PRN

## 2016-05-27 MED ORDER — SODIUM CHLORIDE 0.9% FLUSH
3.0000 mL | Freq: Two times a day (BID) | INTRAVENOUS | Status: DC
  Administered 2016-05-27 (×2): 3 mL via INTRAVENOUS

## 2016-05-27 MED ORDER — METOPROLOL TARTRATE 12.5 MG HALF TABLET
12.5000 mg | ORAL_TABLET | Freq: Two times a day (BID) | ORAL | Status: DC
  Administered 2016-05-27: 12.5 mg via ORAL
  Filled 2016-05-27: qty 1

## 2016-05-27 MED ORDER — FLUOXETINE HCL 20 MG PO CAPS
40.0000 mg | ORAL_CAPSULE | Freq: Two times a day (BID) | ORAL | Status: DC
Start: 2016-05-27 — End: 2016-05-27
  Administered 2016-05-27 (×2): 40 mg via ORAL
  Filled 2016-05-27 (×3): qty 2

## 2016-05-27 MED ORDER — LORAZEPAM 2 MG/ML IJ SOLN
0.5000 mg | Freq: Once | INTRAMUSCULAR | Status: AC
  Administered 2016-05-27: 0.5 mg via INTRAVENOUS
  Filled 2016-05-27: qty 1

## 2016-05-27 MED ORDER — INSULIN ASPART PROT & ASPART (70-30 MIX) 100 UNIT/ML ~~LOC~~ SUSP
70.0000 [IU] | Freq: Two times a day (BID) | SUBCUTANEOUS | Status: DC
  Filled 2016-05-27: qty 10

## 2016-05-27 MED ORDER — INSULIN ASPART 100 UNIT/ML ~~LOC~~ SOLN
0.0000 [IU] | Freq: Three times a day (TID) | SUBCUTANEOUS | Status: DC
  Administered 2016-05-27: 3 [IU] via SUBCUTANEOUS
  Administered 2016-05-27: 1 [IU] via SUBCUTANEOUS

## 2016-05-27 MED ORDER — ASPIRIN EC 81 MG PO TBEC
81.0000 mg | DELAYED_RELEASE_TABLET | Freq: Every day | ORAL | Status: DC
  Administered 2016-05-27: 81 mg via ORAL
  Filled 2016-05-27: qty 1

## 2016-05-27 NOTE — Progress Notes (Addendum)
Called into pt's room complaint of anxiety/SOB. Pt O2 97% on room air. Pt educated on deep breathing, room temperature adjusted. Dr. Doyle Askew paged - no new orders received. Call light and telephone within reach,will continue to monitor.   Received new order for PRN xanax  Fritz Pickerel, RN

## 2016-05-27 NOTE — Progress Notes (Signed)
Patient Name: [name] Date of Encounter: 05/27/2016  Primary Cardiologist:   Hospital Problem List     Active Problems:   Dyspnea    Subjective   The patient states that her SOB occurs when her HR goes down, no chest pain.   Inpatient Medications    Scheduled Meds: . aspirin EC  81 mg Oral Daily  . FLUoxetine  40 mg Oral BID  . insulin aspart  0-5 Units Subcutaneous QHS  . insulin aspart  0-9 Units Subcutaneous TID WC  . insulin aspart protamine- aspart  70 Units Subcutaneous BID WC  . levothyroxine  150 mcg Oral QAC breakfast  . metoprolol tartrate  12.5 mg Oral BID  . pantoprazole  40 mg Oral BID  . sodium chloride flush  3 mL Intravenous Q12H  . ticagrelor  90 mg Oral BID  . traZODone  200 mg Oral QHS   Continuous Infusions:  PRN Meds: albuterol, ALPRAZolam, nitroGLYCERIN, nitroGLYCERIN   Vital Signs    Vitals:   05/27/16 0100 05/27/16 0159 05/27/16 0607 05/27/16 1341  BP: 125/57 (!) 134/54 (!) 123/54 (!) 129/56  Pulse: (!) 53 (!) 53 64 (!) 52  Resp: 12 18 18 19   Temp:  98.2 F (36.8 C) 98.3 F (36.8 C) 98.8 F (37.1 C)  TempSrc:  Oral Oral Oral  SpO2: 96% 98% 99% 94%  Weight:  193 lb 9.6 oz (87.8 kg) 193 lb 9 oz (87.8 kg)   Height:  5\' 6"  (1.676 m)      Intake/Output Summary (Last 24 hours) at 05/27/16 1416 Last data filed at 05/27/16 0830  Gross per 24 hour  Intake              240 ml  Output                0 ml  Net              240 ml   Filed Weights   05/27/16 0159 05/27/16 0607  Weight: 193 lb 9.6 oz (87.8 kg) 193 lb 9 oz (87.8 kg)    Physical Exam    GEN: Obese well developed, in no acute distress.  HEENT: Grossly normal.  Neck: Supple, no JVD, carotid bruits, or masses. Cardiac: RRR, 2/6 systolic murmur AOV, no rubs, or gallops. No clubbing, cyanosis, edema.  Radials/DP/PT 2+ and equal bilaterally.  Respiratory:  Respirations regular and unlabored, clear to auscultation bilaterally. GI: Soft, nontender, nondistended, BS + x  4. MS: no deformity or atrophy. Skin: warm and dry, no rash. Neuro:  Strength and sensation are intact. Psych: AAOx3.  anxious  Labs    CBC  Recent Labs  05/25/16 0650 05/26/16 0638 05/26/16 2108 05/27/16 0359  WBC 9.1 8.8 9.1 10.0  NEUTROABS 6.3 6.6  --   --   HGB 11.4* 10.8* 11.5* 10.3*  HCT 35.8* 33.8* 35.3* 32.2*  MCV 82.7 84.7 81.9 81.9  PLT 210 186 218 [id number]   Basic Metabolic Panel  Recent Labs  05/26/16 2108 05/27/16 0359  NA 131* 136  K 3.8 4.0  CL 97* 104  CO2 24 23  GLUCOSE 133* 191*  BUN 13 11  CREATININE 0.90 0.79  CALCIUM 8.4* 8.4*  MG  --  1.9    Recent Labs  05/25/16 1852 05/26/16 0101 05/26/16 0638 05/26/16 2200 05/27/16 0359 05/27/16 0938  CKTOTAL 64 91 101  --   --   --   CKMB 1.7 5.5* 8.6*  --   --   --  TROPONINI  --   --   --  0.28* 0.21* 0.18*    Recent Labs  05/25/16 0035  TSH 1.873    Telemetry    NSR, pauses from blocked PACs - Personally Reviewed  ECG    NSR, 1st degree AVB - Personally Reviewed  Radiology    Dg Chest 2 View  Result Date: 05/26/2016 CLINICAL DATA:  Onset weakness and difficulty breathing this morning. Status post cardiac catheterization yesterday. EXAM: CHEST  2 VIEW COMPARISON:  PA and lateral chest 05/24/2016.  CT chest 07/19/2015. FINDINGS: The patient is status post CABG and aortic valve replacement. There is mild cardiomegaly. Lungs are clear. No pneumothorax or pleural effusion. IMPRESSION: No acute disease. Electronically Signed   By: Inge Rise M.D.   On: 05/26/2016 21:30    Cardiac Studies   LHC: 12/22  Culprit Lesion: Prox Cx Stent, 80 %stenosed. In-Stent Re-Stenosis  Post intervention - AngioSculpt PTCA only, there is a 15% residual stenosis. Ost Cx to Prox Cx lesion, 0 %stenosed.  ___________  Colon Flattery RCA lesion, 99 %stenosed. Prox RCA lesion, 80 %stenosed. Dist RCA lesion, 95 %stenosed. Mid RCA lesion, 100 %stenosed. - CTO  Ost RCA to Prox RCA Stent, 20  %stenosed.  SVG-dRCA and is large and anatomically normal - perfuses both the RPDA & RPL: Origin lesion, 45 %stenosed.  LIMA-LAD and is normal in caliber, large and anatomically normal - retrograde fills major Diag2.  Mid LAD lesion, 100 %stenosed.   Likely culprit lesion is the in-stent restenosis in the circumflex. This is definitely progression of disease from 2015 prior to CABG. Treated successfully with Angioplasty Only - leaving residual 10-50% stenosis which is similar to the remainder of the stented segment.  Patent LIMA-LAD which fills the diagonal branch as well, also patent SVG-distal RCA fills both branches.  Of note the patient had Wenkebach block on EKG post PCI. She also had significant heartburn from the Plavix. This was treated with 20 of Pepcid. She also had some residual angina following angioplasty. Nitroglycerin infusion was initiated.  Plan:  Transfer patient 6 central postprocedure unit.  Standard TR band removal per protocol.  Wean nitroglycerin infusion OVERNIGHT  Treat GERD symptoms with Maalox  Monitor rhythm overnight. Wenkebach block noted post cath.  I'm not sure this lesion explains the patient's palpitations. She may been noticing Wenkebach block.  Patient Profile   70 yo F w/ a PMH significant for 3vCAD -s/p CABG x 2  (LIMA->LAD and SVG-RCA) 2015 with bioprosthetic AVR, IDDM, and COPD. Pt adm 05/24/16 c/o acute on chronic CP. She had a neg chest CTA 7/15, low risk Myoview Feb 2016, and recent neg chest CTA Feb 2017.    Assessment & Plan     1. CAD, CABG x 2 and tissue AVR 2015, s/p cardiac cath on 12/22, Culprit Lesion: Prox Cx Stent, 80 %stenosed. In-Stent Re-Stenosis, Patent LIMA-LAD which fills the diagonal branch as well, also patent SVG-distal RCA fills both branches.  Readmitted with SOB the last night, troponin 0.28--> 0.18, most probably tain  From the intervention on 12/22, ECG shows SR with 1.AVB, previously Wenckebach periods,  no acute ischemic changes.  I will hold metoprolol 12.5 mg po BID as she feels that her SOB occurs while her HR drops, telemetry shows Wenckebach episodes.  She is adamant about stopping Brilinta, I have taken 30 minutes to explain that she presented with in stent restenosis and stopping Brilinta might lead to another occlusion.   We will try to call  case manager to switch to Effient, not sure we can get it today, she is advised to take it at least for the next few days and we Lucianne Lei possibly switch when she is seen in the clinic later this week.   2. HTN - well controlled  2. Palpitations: She has Mobitz type I AV block. She has palpitations but I reassured her that this type of palpitation was benign. She may need further monitoring to make sure she doesn't have other arrhythmias. I would not increase her metoprolol since she has Mobitz type I block.  Ena Dawley., MD, [facility] 05/27/2016, 2:16 PM [address],  [place] Pager [phone]

## 2016-05-27 NOTE — Discharge Instructions (Signed)
Acute Pain, Adult °Acute pain is a type of pain that may last for just a few days or as long as six months. It is often related to an illness, injury, or medical procedure. Acute pain may be mild, moderate, or severe. It usually goes away once your injury has healed or you are no longer ill. °Pain can make it hard for you to do daily activities. It can cause anxiety and lead to other problems if left untreated. Treatment depends on the cause and severity of your acute pain. °Follow these instructions at home: °· Check your pain level as told by your health care provider. °· Take over-the-counter and prescription medicines only as told by your health care provider. °· If you are taking prescription pain medicine: °¨ Ask your health care provider about taking a stool softener or laxative to prevent constipation. °¨ Do not stop taking the medicine suddenly. Talk to your health care provider about how and when to discontinue prescription pain medicine. °¨ If your pain is severe, do not take more pills than instructed by your health care provider. °¨ Do not take other over-the-counter pain medicines in addition to this medicine unless told by your health care provider. °¨ Do not drive or operate heavy machinery while taking prescription pain medicine. °· Apply ice or heat as told by your health care provider. These may reduce swelling and pain. °· Ask your health care provider if other strategies such as distraction, relaxation, or physical therapies can help your pain. °· Keep all follow-up visits as told by your health care provider. This is important. °Contact a health care provider if: °· You have pain that is not controlled by medicine. °· Your pain does not improve or gets worse. °· You have side effects from pain medicines, such as vomiting or confusion. °Get help right away if: °· You have severe pain. °· You have trouble breathing. °· You lose consciousness. °· You have chest pain or pressure that lasts for more  than a few minutes. Along with the chest pain you may: °¨ Have pain or discomfort in one or both arms, your back, neck, jaw, or stomach. °¨ Have shortness of breath. °¨ Break out in a cold sweat. °¨ Feel nauseous. °¨ Become light-headed. °These symptoms may represent a serious problem that is an emergency. Do not wait to see if the symptoms will go away. Get medical help right away. Call your local emergency services (911 in the [place] Do not drive yourself to the hospital.  °This information is not intended to replace advice given to you by your health care provider. Make sure you discuss any questions you have with your health care provider. °Document Released: 06/05/2015 Document Revised: 10/28/2015 Document Reviewed: 06/05/2015 °Elsevier Interactive Patient Education © 2017 [employer] ° °

## 2016-05-27 NOTE — Progress Notes (Signed)
Pt has been complaining about a "panic like feeling" when she closes her eyes , MD notified, new order received.

## 2016-05-27 NOTE — Progress Notes (Signed)
Pt arrived to the floor with the ED RN, alert and oriented in no apparent distress. Heart monitor applied   In sinus brady with a HR of 52. Awaiting for an assigned provider to page for orders.

## 2016-05-27 NOTE — ED Notes (Signed)
Pt and family made aware of bed assignment 

## 2016-05-27 NOTE — H&P (Signed)
History and Physical    [name] [id number] DOB: [birth year] DOA: 05/26/2016  PCP: Glendon Axe, MD  Patient coming from: home  Chief Complaint: sob  HPI: [name] is a 70 y.o. female with SOB.  Pt states problem started from the morning of 12/23.  She was admitted under the cards svc for stent stenosis s/p angioplasty.  Sob is continuous.  Chest pressure in in center of chest, no radiation, no n/v.  + lightheaded, no pain or palpitations. No cough, hemoptysis, nausea, vomiting, fever, chills, abdominal pain, dysuria, hematuria, polyuria, nocturia, BRBPR, melena, hematochezia, heat/cold intolerance, orthopnea, PND, leg swelling Cardiology was notified and their impression was this is related to [place].  ED Course: obtained labs and notified cardiology  Review of Systems: As per HPI otherwise 10 point review of systems negative.   Past Medical History:  Diagnosis Date  . ACE-inhibitor cough   . Allergic rhinitis   . Anemia    iron deficiency anemia  . Anxiety   . Arthritis   . Asthma   . Cataract   . Chronic depression   . Chronic headache   . COPD (chronic obstructive pulmonary disease) ([place])   . Coronary artery disease    a. s/p CABG x2 (LIMA-LAD and SVG-RCA)  b. NST neg for ischemia 07/24/14  . Diabetes mellitus    type 2  . Diverticulitis of colon   . GERD (gastroesophageal reflux disease)   . H/O aortic valve replacement   . Hearing loss   . History of blood transfusion 2013  . HOH (hard of hearing)   . Hypercholesterolemia    intolerance of statins and niaspan  . OSA (obstructive sleep apnea)    mild, intolerant of cpap  . PONV (postoperative nausea and vomiting)   . Thyroid disease     Past Surgical History:  Procedure Laterality Date  . ABDOMINAL HYSTERECTOMY    . ABDOMINAL HYSTERECTOMY W/ PARTIAL VAGINACTOMY    . AORTIC VALVE REPLACEMENT N/A 10/12/2013   Procedure: AORTIC VALVE REPLACEMENT (AVR);  Surgeon: [name], MD;  Location: [facility];  Service: Open Heart Surgery;  Laterality: N/A;  . [place]  . BARTHOLIN GLAND CYST EXCISION    . BLADDER SUSPENSION    . BREAST BIOPSY Bilateral 09/11/2000   neg  . BREAST BIOPSY Left 07/24/2010   neg  . BREAST CYST EXCISION  1988   bilateral nonmalignant tumors, x3  . CARDIAC CATHETERIZATION    . CATARACT EXTRACTION W/ INTRAOCULAR LENS  IMPLANT, BILATERAL    . CHOLECYSTECTOMY  2001  . COLECTOMY     lap sigmoid  . COLONOSCOPY  2014   polyps found, 2 clamped off.  . CORONARY ANGIOPLASTY  10/29/2007   Prox RCA & Mid Cx.  . CORONARY ARTERY BYPASS GRAFT N/A 10/12/2013   Procedure: CORONARY ARTERY BYPASS GRAFT TIMES TWO;  Surgeon: [name], MD;  Location: [facility];  Service: Open Heart Surgery;  Laterality: N/A;  . LEFT HEART CATHETERIZATION WITH CORONARY ANGIOGRAM N/A 10/09/2013   Procedure: LEFT HEART CATHETERIZATION WITH CORONARY ANGIOGRAM;  Surgeon: [name], MD;  Location[facility] C[facility] LAB;  Service: Cardiovascular;  Laterality: N/A;  . STERNAL WIRES REMOVAL N/A 04/13/2014   Procedure: STERNAL WIRES REMOVAL;  Surgeon: [name], MD;  Location: [facility] OR;  Service: Thoracic;  Laterality: N/A;  . THYROIDECTOMY    . TONSILLECTOMY    . TUBAL LIGATION    . VAGINAL DELIVERY     3  reports that she quit smoking about 2 years ago. Her smoking use included Cigarettes. She has a 15.00 pack-year smoking history. She has never used smokeless tobacco. She reports that she does not drink alcohol or use drugs.  Allergies  Allergen Reactions  . Amitriptyline Other (See Comments)    Unknown reaction  . Benadryl [Diphenhydramine] Shortness Of Breath  . Demerol [Meperidine] Other (See Comments)    Unknown reaction  . Gabapentin Other (See Comments)    Unknown reaction  . Loratadine Other (See Comments)    Unknown reaction  . Meperidine Hcl Other (See Comments)    Unknown reaction  . Mirtazapine Other (See Comments)    Unknown reaction  . Olanzapine Other (See  Comments)    Unknown reaction   . Voltaren [Diclofenac Sodium] Shortness Of Breath  . Zetia [Ezetimibe] Other (See Comments)    Weakness in legs, shakiness all over  . Ativan [Lorazepam] Other (See Comments)    Causes double vision at highter than .5 mg dose  . Atorvastatin Other (See Comments)    Muscle aches and weakness  . Budesonide-Formoterol Fumarate Other (See Comments)    Shakiness, tremors  . Bupropion Hcl Other (See Comments)    "cloud over me" depression  . Caffeine Other (See Comments)    jitters  . Codeine Sulfate Other (See Comments)    Makes chest hurt like a heart attack  . Lisinopril Cough  . Metformin Nausea And Vomiting  . Mometasone Furoate Nausea And Vomiting  . Morphine Sulfate Other (See Comments)    Chest pain like a heart attack  . Other Other (See Comments)    Beta Blockers, reaction shortness of breath  . Oxycodone-Acetaminophen Nausea And Vomiting  . Pioglitazone Other (See Comments)    Cannot take because of risk of bladder cancer  . Propoxyphene N-Acetaminophen Nausea And Vomiting  . Rosuvastatin Other (See Comments)    Muscle aches and weakness  . Shellfish Allergy Diarrhea  . Tramadol Nausea Only  . Venlafaxine Other (See Comments)    Unknown reaction  . Zolpidem Tartrate Other (See Comments)     Jittery, diarrhea  . Latex Rash    Family History  Problem Relation Age of Onset  . Breast cancer Mother 60  . Hypertension Father   . Mesothelioma Father   . Asthma Father   . Stroke Paternal Grandfather   . Heart disease    . Breast cancer Maternal Aunt   . Breast cancer Paternal Aunt    Family hx reviewed  Prior to Admission medications   Medication Sig Start Date End Date Taking? Authorizing Provider  albuterol (PROVENTIL HFA;VENTOLIN HFA) 108 (90 Base) MCG/ACT inhaler Inhale 2 puffs into the lungs 2 (two) times daily at 10 AM and 5 PM. Patient taking differently: Inhale 2 puffs into the lungs every 6 (six) hours as needed for  wheezing or shortness of breath.  07/22/15  Yes Flora Lipps, MD  aspirin EC 81 MG tablet Take 81 mg by mouth daily.   Yes Historical Provider, MD  Calcium-Vitamin D (CALTRATE 600 PLUS-VIT D PO) Take 2 tablets by mouth 2 (two) times daily.    Yes Historical Provider, MD  FLUoxetine (PROZAC) 40 MG capsule Take 40 mg by mouth 2 (two) times daily. 07/29/15  Yes Historical Provider, MD  insulin NPH-regular Human (NOVOLIN 70/30) (70-30) 100 UNIT/ML injection Inject 70 Units into the skin 2 (two) times daily.   Yes Historical Provider, MD  levothyroxine (SYNTHROID, LEVOTHROID) 150 MCG tablet Take  150 mcg by mouth daily before breakfast.   Yes Historical Provider, MD  nitroGLYCERIN (NITROSTAT) 0.4 MG SL tablet Place 0.4 mg under the tongue every 5 (five) minutes as needed for chest pain.   Yes Historical Provider, MD  [facility] DELICA LANCETS [id number] MISC USE TO CHECK BLOOD SUGAR THREE TIMES DAILY 05/17/14  Yes Historical Provider, MD  pantoprazole (PROTONIX) 40 MG tablet Take 1 tablet (40 mg total) by mouth daily. Patient taking differently: Take 40 mg by mouth 2 (two) times daily.  05/27/16  Yes Ryan M Dunn, PA-C  ticagrelor (BRILINTA) 90 MG TABS tablet Take 1 tablet (90 mg total) by mouth 2 (two) times daily. 05/26/16  Yes Ryan M Dunn, PA-C  traZODone (DESYREL) 100 MG tablet TAKE TWO TABLETS BY MOUTH AT BEDTIME 08/15/15  Yes Lucille Passy, MD  metoprolol tartrate (LOPRESSOR) 25 MG tablet Take 0.5 tablets (12.5 mg total) by mouth 2 (two) times daily. Patient not taking: Reported on 05/26/2016 05/26/16   Areta Haber Dunn, PA-C  nitroGLYCERIN (NITROLINGUAL) 0.4 MG/SPRAY spray Place 1 spray under the tongue every 5 (five) minutes x 3 doses as needed for chest pain. Patient not taking: Reported on 05/26/2016 05/26/16   Rise Mu, PA-C    Physical Exam: Vitals:   05/27/16 0030 05/27/16 0045 05/27/16 0100 05/27/16 0159  BP: 140/67 (!) 129/54 125/57 (!) 134/54  Pulse: (!) 52 (!) 48 (!) 53 (!) 53  Resp: 16 15 12 18     Temp:    98.2 F (36.8 C)  TempSrc:    Oral  SpO2: 94% 99% 96% 98%  Weight:    87.8 kg (193 lb 9.6 oz)  Height:    5\' 6"  (1.676 m)      Constitutional: NAD, calm, comfortable Vitals:   05/27/16 0030 05/27/16 0045 05/27/16 0100 05/27/16 0159  BP: 140/67 (!) 129/54 125/57 (!) 134/54  Pulse: (!) 52 (!) 48 (!) 53 (!) 53  Resp: 16 15 12 18   Temp:    98.2 F (36.8 C)  TempSrc:    Oral  SpO2: 94% 99% 96% 98%  Weight:    87.8 kg (193 lb 9.6 oz)  Height:    5\' 6"  (1.676 m)   Eyes: PERRL, lids and conjunctivae normal, anicteric ENMT: Mucous membranes are moist. Posterior pharynx clear of any exudate or lesions.Normal dentition.  Neck: normal, supple, no masses, no thyromegaly Respiratory: clear to auscultation bilaterally, no wheezing, no crackles. Normal respiratory effort. No accessory muscle use.  Cardiovascular: Regular rate and rhythm, no murmurs / rubs / gallops. No extremity edema. 2+ pedal pulses. No carotid bruits.  Abdomen: no tenderness, no masses palpated. No hepatosplenomegaly. Bowel sounds positive.  Musculoskeletal: no clubbing / cyanosis. No joint deformity upper and lower extremities. Good ROM, no contractures. Normal muscle tone.  Skin: no rashes, lesions, ulcers. No induration Neurologic: CN 2-12 grossly intact. Sensation intact, DTR normal. Strength 5/5 in all 4.  Psychiatric: Normal judgment and insight. Alert and oriented x 3. Normal mood.  Capillary refill less than 2 seconds   Labs on Admission: I have personally reviewed following labs and imaging studies  CBC:  Recent Labs Lab 05/24/16 2023 05/25/16 0650 05/26/16 0638 05/26/16 2108  WBC 11.5* 9.1 8.8 9.1  NEUTROABS  --  6.3 6.6  --   HGB 11.6* 11.4* 10.8* 11.5*  HCT 35.5* 35.8* 33.8* 35.3*  MCV 82.6 82.7 84.7 81.9  PLT 206 210 186 [id number]   Basic Metabolic Panel:  Recent Labs Lab 05/24/16 2023  05/25/16 0650 05/26/16 0638 05/26/16 2108  NA 136 138 138 131*  K 4.2 4.2 4.4 3.8  CL 103 103 104  97*  CO2 26 26 27 24   GLUCOSE 209* 107* 144* 133*  BUN 12 10 11 13   CREATININE 0.85 0.88 0.91 0.90  CALCIUM 8.8* 8.9 8.2* 8.4*   GFR: Estimated Creatinine Clearance: 64 mL/min (by C-G formula based on SCr of 0.9 mg/dL). Liver Function Tests: No results for input(s): AST, ALT, ALKPHOS, BILITOT, PROT, ALBUMIN in the last 168 hours. No results for input(s): LIPASE, AMYLASE in the last 168 hours. No results for input(s): AMMONIA in the last 168 hours. Coagulation Profile:  Recent Labs Lab 05/25/16 0650  INR 1.12   Cardiac Enzymes:  Recent Labs Lab 05/25/16 0035 05/25/16 0650 05/25/16 1255 05/25/16 1852 05/26/16 0101 05/26/16 0638 05/26/16 2200  CKTOTAL 65 61 56 64 91 101  --   CKMB 2.1 1.6 1.3 1.7 5.5* 8.6*  --   TROPONINI <0.03 <0.03 <0.03  --   --   --  0.28*   BNP (last 3 results) No results for input(s): PROBNP in the last 8760 hours. HbA1C:  Recent Labs  05/25/16 0035  HGBA1C 6.9*   CBG:  Recent Labs Lab 05/25/16 1613 05/25/16 2045 05/26/16 0608  GLUCAP 123* 157* 145*   Lipid Profile: No results for input(s): CHOL, HDL, LDLCALC, TRIG, CHOLHDL, LDLDIRECT in the last 72 hours. Thyroid Function Tests:  Recent Labs  05/25/16 0035 05/25/16 0650  TSH 1.873  --   FREET4  --  0.94   Anemia Panel: No results for input(s): VITAMINB12, FOLATE, FERRITIN, TIBC, IRON, RETICCTPCT in the last 72 hours. Urine analysis:    Component Value Date/Time   COLORURINE Yellow 11/25/2012 1335   COLORURINE yellow 11/11/2009 0854   APPEARANCEUR Clear 11/25/2012 1335   LABSPEC 1.017 11/25/2012 1335   PHURINE 6.0 11/25/2012 1335   PHURINE 5.0 11/11/2009 0854   GLUCOSEU Negative 11/25/2012 1335   HGBUR Negative 11/25/2012 1335   HGBUR negative 11/11/2009 0854   BILIRUBINUR Negative 11/25/2012 1335   KETONESUR Negative 11/25/2012 1335   PROTEINUR Negative 11/25/2012 1335   UROBILINOGEN 0.2 11/11/2009 0854   NITRITE Negative 11/25/2012 1335   NITRITE negative  11/11/2009 0854   LEUKOCYTESUR Negative 11/25/2012 1335    Sepsis Labs:No results found for this or any previous visit (from the past 240 hour(s)).   Radiological Exams on Admission: Dg Chest 2 View  Result Date: 05/26/2016 CLINICAL DATA:  Onset weakness and difficulty breathing this morning. Status post cardiac catheterization yesterday. EXAM: CHEST  2 VIEW COMPARISON:  PA and lateral chest 05/24/2016.  CT chest 07/19/2015. FINDINGS: The patient is status post CABG and aortic valve replacement. There is mild cardiomegaly. Lungs are clear. No pneumothorax or pleural effusion. IMPRESSION: No acute disease. Electronically Signed   By: Inge Rise M.D.   On: 05/26/2016 21:30    EKG: by my independent review. NSR 59  Assessment/Plan Active Problems:   Dyspnea   Dyspnea/Chest pressure: serial trop/ekg and labs.  Ck tele, continue cardiac meds, monitor.    HTN: continue meds, monitor, optimize care and Rx  DM: SSI coverage, ck labs, monitor  GERD: stable, chronic, cont meds  COPD: chronic, stable, monitor, continue meds  DVT prophylaxis: LMWH  Code Status: Full  Family Communication: none  Disposition Plan: Home  Consults called: cardiology notified from ER  Admission status: OBS    Carmon Ginsberg MD [facility] Pager [phone]  If 7PM-7AM, please  contact night-coverage [email or web address] Password [email or web address]  05/27/2016, 2:39 AM

## 2016-05-27 NOTE — Discharge Summary (Signed)
Physician Discharge Summary  [name] [id number] DOB: 70 1947 DOA: 05/26/2016  PCP: Glendon Axe, MD  Admit date: 05/26/2016 Discharge date: 05/27/2016  Recommendations for Outpatient Follow-up:  1. Pt will need to follow up with PCP in 1-2 weeks post discharge 2. Please obtain BMP to evaluate electrolytes and kidney function 3. Please also check CBC to evaluate Hg and Hct levels  Discharge Diagnoses:  Active Problems:   Dyspnea  Discharge Condition: Stable  Diet recommendation: Heart healthy diet discussed in details   History of present illness:  70 yo F w/ a PMH significant for 3vCAD -s/p CABG x 2  (LIMA->LAD and SVG-RCA) 2015 with bioprosthetic AVR, IDDM, and COPD. Pt adm 05/24/16 c/o acute on chronic CP. She had a neg chest CTA 7/15, low risk Myoview Feb 2016, and recent neg chest CTA Feb 2017. She was discharged from cardiology service day prior and presented back for evaluation of worsening dyspnea.   Assessment & Plan    1. CAD, CABG x 2 and tissue AVR 2015, s/p cardiac cath on 12/22, Culprit Lesion: Prox Cx Stent, 80 %stenosed. In-Stent Re-Stenosis, Patent LIMA-LAD which fills the diagonal branch as well, also patent SVG-distal RCA fills both branches. - troponin 0.28--> 0.18, most probably post intervention on 12/22 - ECG shows SR with 1.AVB, previously Wenckebach periods, no acute ischemic changes - metoprolol stopped per cardiology  - advised to take Brillinta at least for the next few days and we Lucianne Lei possibly switch when she is seen in the clinic later this week  2. HTN, essential - reasonably stable   3. Palpitations: She has Mobitz type I AV block. Per cardiology, this is expected and should eventually resolved    Procedures/Studies: Dg Chest 2 View  Result Date: 05/26/2016 CLINICAL DATA:  Onset weakness and difficulty breathing this morning. Status post cardiac catheterization yesterday. EXAM: CHEST  2 VIEW COMPARISON:  PA and lateral chest  05/24/2016.  CT chest 07/19/2015. FINDINGS: The patient is status post CABG and aortic valve replacement. There is mild cardiomegaly. Lungs are clear. No pneumothorax or pleural effusion. IMPRESSION: No acute disease. Electronically Signed   By: Inge Rise M.D.   On: 05/26/2016 21:30   Dg Chest 2 View  Result Date: 05/24/2016 CLINICAL DATA:  Chest pain for 2 weeks, worsening. EXAM: CHEST  2 VIEW COMPARISON:  Chest CT 07/19/2015, radiographs 06/08/2015 FINDINGS: Patient is post median sternotomy and aortic valve replacement. Stable heart size and mediastinal contours from prior exam. Mild increased interstitial opacities are chronic. There is no evidence of pulmonary edema. No focal airspace disease or pleural fluid. No pneumothorax. No acute osseous abnormality. IMPRESSION: No acute abnormality or change from prior exams. Post aortic valve replacement. Electronically Signed   By: Jeb Levering M.D.   On: 05/24/2016 21:16     Discharge Exam: Vitals:   05/27/16 0607 05/27/16 1341  BP: (!) 123/54 (!) 129/56  Pulse: 64 (!) 52  Resp: 18 19  Temp: 98.3 F (36.8 C) 98.8 F (37.1 C)   Vitals:   05/27/16 0100 05/27/16 0159 05/27/16 0607 05/27/16 1341  BP: 125/57 (!) 134/54 (!) 123/54 (!) 129/56  Pulse: (!) 53 (!) 53 64 (!) 52  Resp: 12 18 18 19   Temp:  98.2 F (36.8 C) 98.3 F (36.8 C) 98.8 F (37.1 C)  TempSrc:  Oral Oral Oral  SpO2: 96% 98% 99% 94%  Weight:  87.8 kg (193 lb 9.6 oz) 87.8 kg (193 lb 9 oz)   Height:  5'  6" (1.676 m)      General: Pt is alert, follows commands appropriately, not in acute distress Cardiovascular: Regular rate and rhythm, S1/S2 +, no rubs, no gallops Respiratory: Clear to auscultation bilaterally, no wheezing, no crackles, no rhonchi Abdominal: Soft, non tender, non distended, bowel sounds +, no guarding  Discharge Instructions   Allergies as of 05/27/2016      Reactions   Amitriptyline Other (See Comments)   Unknown reaction   Benadryl  [diphenhydramine] Shortness Of Breath   Demerol [meperidine] Other (See Comments)   Unknown reaction   Gabapentin Other (See Comments)   Unknown reaction   Loratadine Other (See Comments)   Unknown reaction   Meperidine Hcl Other (See Comments)   Unknown reaction   Mirtazapine Other (See Comments)   Unknown reaction   Olanzapine Other (See Comments)   Unknown reaction   Voltaren [diclofenac Sodium] Shortness Of Breath   Zetia [ezetimibe] Other (See Comments)   Weakness in legs, shakiness all over   Ativan [lorazepam] Other (See Comments)   Causes double vision at highter than .5 mg dose   Atorvastatin Other (See Comments)   Muscle aches and weakness   Budesonide-formoterol Fumarate Other (See Comments)   Shakiness, tremors   Bupropion Hcl Other (See Comments)   "cloud over me" depression   Caffeine Other (See Comments)   jitters   Codeine Sulfate Other (See Comments)   Makes chest hurt like a heart attack   Lisinopril Cough   Metformin Nausea And Vomiting   Mometasone Furoate Nausea And Vomiting   Morphine Sulfate Other (See Comments)   Chest pain like a heart attack   Other Other (See Comments)   Beta Blockers, reaction shortness of breath   Oxycodone-acetaminophen Nausea And Vomiting   Pioglitazone Other (See Comments)   Cannot take because of risk of bladder cancer   Propoxyphene N-acetaminophen Nausea And Vomiting   Rosuvastatin Other (See Comments)   Muscle aches and weakness   Shellfish Allergy Diarrhea   Tramadol Nausea Only   Venlafaxine Other (See Comments)   Unknown reaction   Zolpidem Tartrate Other (See Comments)    Jittery, diarrhea   Latex Rash      Medication List    STOP taking these medications   metoprolol tartrate 25 MG tablet Commonly known as:  LOPRESSOR     TAKE these medications   albuterol 108 (90 Base) MCG/ACT inhaler Commonly known as:  PROVENTIL HFA;VENTOLIN HFA Inhale 2 puffs into the lungs 2 (two) times daily at 10 AM and 5  PM. What changed:  when to take this  reasons to take this   aspirin EC 81 MG tablet Take 81 mg by mouth daily.   CALTRATE 600 PLUS-VIT D PO Take 2 tablets by mouth 2 (two) times daily.   FLUoxetine 40 MG capsule Commonly known as:  PROZAC Take 40 mg by mouth 2 (two) times daily.   insulin NPH-regular Human (70-30) 100 UNIT/ML injection Commonly known as:  NOVOLIN 70/30 Inject 70 Units into the skin 2 (two) times daily.   levothyroxine 150 MCG tablet Commonly known as:  SYNTHROID, LEVOTHROID Take 150 mcg by mouth daily before breakfast.   nitroGLYCERIN 0.4 MG SL tablet Commonly known as:  NITROSTAT Place 0.4 mg under the tongue every 5 (five) minutes as needed for chest pain.   nitroGLYCERIN 0.4 MG/SPRAY spray Commonly known as:  NITROLINGUAL Place 1 spray under the tongue every 5 (five) minutes x 3 doses as needed for chest pain.  ONETOUCH DELICA LANCETS [id number] Misc USE TO CHECK BLOOD SUGAR THREE TIMES DAILY   pantoprazole 40 MG tablet Commonly known as:  PROTONIX Take 1 tablet (40 mg total) by mouth daily. What changed:  when to take this   ticagrelor 90 MG Tabs tablet Commonly known as:  BRILINTA Take 1 tablet (90 mg total) by mouth 2 (two) times daily.   traZODone 100 MG tablet Commonly known as:  DESYREL TAKE TWO TABLETS BY MOUTH AT BEDTIME      Follow-up Information    Singh,Jasmine, MD Follow up.   Specialty:  Internal Medicine Contact information: [place] [address] [phone]        Faye Ramsay, MD Follow up.   Specialty:  Internal Medicine Why:  call  my cell phone [phone] Contact information: [address] [place] [address] [phone]            The results of significant diagnostics from this hospitalization (including imaging, microbiology, ancillary and laboratory) are listed below for reference.     Microbiology: No results found for this  or any previous visit (from the past 240 hour(s)).   Labs: Basic Metabolic Panel:  Recent Labs Lab 05/24/16 2023 05/25/16 0650 05/26/16 0638 05/26/16 2108 05/27/16 0359  NA 136 138 138 131* 136  K 4.2 4.2 4.4 3.8 4.0  CL 103 103 104 97* 104  CO2 26 26 27 24 23   GLUCOSE 209* 107* 144* 133* 191*  BUN 12 10 11 13 11   CREATININE 0.85 0.88 0.91 0.90 0.79  CALCIUM 8.8* 8.9 8.2* 8.4* 8.4*  MG  --   --   --   --  1.9   CBC:  Recent Labs Lab 05/24/16 2023 05/25/16 0650 05/26/16 0638 05/26/16 2108 05/27/16 0359  WBC 11.5* 9.1 8.8 9.1 10.0  NEUTROABS  --  6.3 6.6  --   --   HGB 11.6* 11.4* 10.8* 11.5* 10.3*  HCT 35.5* 35.8* 33.8* 35.3* 32.2*  MCV 82.6 82.7 84.7 81.9 81.9  PLT 206 210 186 218 186   Cardiac Enzymes:  Recent Labs Lab 05/25/16 0650 05/25/16 1255 05/25/16 1852 05/26/16 0101 05/26/16 0638 05/26/16 2200 05/27/16 0359 05/27/16 0938  CKTOTAL 61 56 64 91 101  --   --   --   CKMB 1.6 1.3 1.7 5.5* 8.6*  --   --   --   TROPONINI <0.03 <0.03  --   --   --  0.28* 0.21* 0.18*   BNP: BNP (last 3 results)  Recent Labs  05/26/16 2157  BNP 328.4*   CBG:  Recent Labs Lab 05/25/16 1613 05/25/16 2045 05/26/16 0608 05/27/16 0915 05/27/16 1141  GLUCAP 123* 157* 145* 134* 185*     SIGNED: Time coordinating discharge: 30 minutes  MAGICK-MYERS, ISKRA, MD  [facility] 05/27/2016, 4:13 PM Pager [phone]  If 7PM-7AM, please contact night-coverage [email or web address] Password [email or web address]

## 2016-05-27 NOTE — Progress Notes (Addendum)
Obtained verbal order from Dr. Doyle Askew for accuchecks ACHS and sensitive sliding scale coverage. CBG 134 at 0915. Spoke with pharmacy regarding Novolog 70/30. Their recommendation was to give 1u sliding scale coverage at this time and consult Diabetes Coordinator regarding Novolog 70/30.  Spoke with Diabetes Coordinator at 1153 : she will place notes with recommendations.   Fritz Pickerel, RN

## 2016-05-27 NOTE — Progress Notes (Signed)
  Call received at 12:30 regarding patient's home 70/30 regimen.  Note patient is being d/c'd this evening.  No further recs.    Thanks, Adah Perl, RN, BC-ADM Inpatient Diabetes Coordinator Pager [phone] (8a-5p)

## 2016-05-27 NOTE — Progress Notes (Signed)
Discussed with the patient and all questioned fully answered. She will call me if any problems arise. Pt given discharge instructions. Verbalized understanding.  IV removed. Telemetry removed.   Fritz Pickerel, RN

## 2016-05-29 ENCOUNTER — Encounter (HOSPITAL_COMMUNITY): Payer: Self-pay | Admitting: Cardiology

## 2016-06-01 DIAGNOSIS — J301 Allergic rhinitis due to pollen: Secondary | ICD-10-CM | POA: Diagnosis not present

## 2016-06-07 ENCOUNTER — Ambulatory Visit (INDEPENDENT_AMBULATORY_CARE_PROVIDER_SITE_OTHER): Payer: Medicare Other | Admitting: Cardiology

## 2016-06-07 ENCOUNTER — Ambulatory Visit (INDEPENDENT_AMBULATORY_CARE_PROVIDER_SITE_OTHER): Payer: Medicare Other

## 2016-06-07 ENCOUNTER — Encounter: Payer: Self-pay | Admitting: Cardiology

## 2016-06-07 ENCOUNTER — Other Ambulatory Visit: Payer: Self-pay | Admitting: Cardiology

## 2016-06-07 VITALS — BP 150/78 | HR 64 | Ht 66.0 in | Wt 193.0 lb

## 2016-06-07 DIAGNOSIS — R001 Bradycardia, unspecified: Secondary | ICD-10-CM

## 2016-06-07 DIAGNOSIS — R002 Palpitations: Secondary | ICD-10-CM

## 2016-06-07 DIAGNOSIS — R42 Dizziness and giddiness: Secondary | ICD-10-CM

## 2016-06-07 MED ORDER — CLOPIDOGREL BISULFATE 75 MG PO TABS: 75.0000 mg | ORAL_TABLET | Freq: Every day | ORAL | 11 refills | Status: DC

## 2016-06-07 NOTE — Patient Instructions (Signed)
Medication Instructions:  1. STOP THE BRILINTA  2. START PLAVIX 75 MG DAILY; NEW RX HAS BEEN SENT IN  3. CONTINUE THE ASPIRIN  Labwork: 1. BMET, CBC TODAY  Testing/Procedures: 1. Your physician has recommended that you wear an event monitor. Event monitors are medical devices that record the heart's electrical activity. Doctors most often [place] these monitors to diagnose arrhythmias. Arrhythmias are problems with the speed or rhythm of the heartbeat. The monitor is a small, portable device. You can wear one while you do your normal daily activities. This is usually used to diagnose what is causing palpitations/syncope (passing out).    Follow-Up: 3-4 WEEKS WITH  BRITTANY SIMMONS, PAC   Any Other Special Instructions Will Be Listed Below (If Applicable).     If you need a refill on your cardiac medications before your next appointment, please call your pharmacy.

## 2016-06-07 NOTE — Progress Notes (Addendum)
06/07/2016 [name]   1945/07/21  [id number]  Primary Physician Singh,Jasmine, MD Primary Cardiologist: Dr. Angelena Form   Reason for Visit/CC: [name] F/u for CAD   HPI:  71 year old female with history of 3 vessel CAD s/p 2 vessel CABG (LIMA-LAD, SVG-RCA), severe AR s/p bioprosthetic AVR, DM2, COPD and chronic chest pain who presented to [facility] on 05/24/16 with chest pain c/w unstable angina. She was admitted and ruled out for MI. Given her history and nature of symptoms, definitive LHC was recommended.   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 This was treated successfully with PTCA only, leaving a residual 10-50% stenosis that was similar to the remainder of the stented segment. She was noted to have patent LIMA-LAD which filled the diagonal branch as well, also with patent SVG-distal RVA. Of note she had Wenkebach on EKG post PCI, it was uncertain if this was playing a role in her palpitations. She had significant reflux from Plavix treated with Pepcid. Post PCI she did have some residual angina that was successfully treated with nitro gtt and weaned off without any further angina. P2Y12 was checked and found to be 301. This was discussed with Dr. Gwenlyn Found and she was changed to [facility] on his recommendation. Effient was too expensive.   She was discharged home on 05/26/16 but presented back to [facility] on 12/24 with complaint of dyspnea. Patient stated that she would get short of breath when her HR dropped. She also contributed this to Brilinta. Troponin was slightly abnormal 0.28--> 0.18, felt to be residual from her cath, 2 days prior.  ECG showed SR with 1st degree AVB, previously Wenckebach periods, no acute ischemic changes. Her BB was held and symptoms improved. Her metoprolol was discontinued at discharge. Dr. Meda Coffee suggested possible evaluation with an outpatient monitor to reassess her  bradycardia/ screen for other arrhthymias is still symptomatic. Of note, the patient was also adamant about stopping Brilinta. It is well outlined in Dr.Nelson's consult note from 12/24/ that she had a long discussion regarding the importance of DAPT with ASA + Brilinta to prevent reocclusion. Despite these recommendations, the patient continues to refuse to take Brilinta. She has only been taking ASA. No other antiplatelets. It was discovered that [place] is too expensive. As outlined above, she is not a Plavix responder. P2Y12 was checked and found to be 301 (literature has shown a direct correlation of PRU values over 230 with higher risks of thrombotic events). She refuses to switch from Nexium to Protonix. She states that Nexium is the only thing that helps her GERD. I had a long discussion with the patient and her husband regarding her risk w/o DAPT.  Her main complaint today is persistent dizziness. Symptoms sound more related to vertigo. The room spins. She gets relief when lying still in bed. Orthostatics were checked today and negative. No hypotension. EKG shows NSR with 1st degree AVB. HR is stable in the 60s. She notes less palpitations. Exam is benign no bruits. No chest pain or dyspnea. She has f/u with her PCP in 1 week, also for dizziness and anxiety.     Current Meds  Medication Sig  . albuterol (PROVENTIL HFA;VENTOLIN HFA) 108 (90 Base) MCG/ACT inhaler Inhale 2 puffs into the lungs 2 (two) times daily at 10 AM and 5 PM. (Patient taking differently: Inhale 2 puffs into the lungs every 6 (six)  hours as needed for wheezing or shortness of breath. )  . aspirin EC 81 MG tablet Take 81 mg by mouth daily.  . Calcium-Vitamin D (CALTRATE 600 PLUS-VIT D PO) Take 2 tablets by mouth 2 (two) times daily.   Marland Kitchen dimenhyDRINATE (DRAMAMINE) 50 MG tablet Take by mouth.  . esomeprazole (NEXIUM) 40 MG capsule Take by mouth.  Marland Kitchen FLUoxetine (PROZAC) 40 MG capsule Take 40 mg by mouth 2 (two) times daily.    . insulin NPH-regular Human (NOVOLIN 70/30) (70-30) 100 UNIT/ML injection Inject 70 Units into the skin 2 (two) times daily.  Marland Kitchen levothyroxine (SYNTHROID, LEVOTHROID) 150 MCG tablet Take 150 mcg by mouth daily before breakfast.  . nitroGLYCERIN (NITROLINGUAL) 0.4 MG/SPRAY spray Place 1 spray under the tongue every 5 (five) minutes x 3 doses as needed for chest pain.  . nitroGLYCERIN (NITROSTAT) 0.4 MG SL tablet Place 0.4 mg under the tongue every 5 (five) minutes as needed for chest pain.  [name] DELICA LANCETS [id number] MISC USE TO CHECK BLOOD SUGAR THREE TIMES DAILY  . traZODone (DESYREL) 100 MG tablet TAKE TWO TABLETS BY MOUTH AT BEDTIME   Allergies  Allergen Reactions  . Amitriptyline Other (See Comments)    Unknown reaction  . Benadryl [Diphenhydramine] Shortness Of Breath  . Demerol [Meperidine] Other (See Comments)    Unknown reaction  . Gabapentin Other (See Comments)    Unknown reaction  . Loratadine Other (See Comments)    Unknown reaction  . Meperidine Hcl Other (See Comments)    Unknown reaction  . Mirtazapine Other (See Comments)    Unknown reaction  . Olanzapine Other (See Comments)    Unknown reaction   . Voltaren [Diclofenac Sodium] Shortness Of Breath  . Zetia [Ezetimibe] Other (See Comments)    Weakness in legs, shakiness all over  . Ativan [Lorazepam] Other (See Comments)    Causes double vision at highter than .5 mg dose  . Atorvastatin Other (See Comments)    Muscle aches and weakness  . Budesonide-Formoterol Fumarate Other (See Comments)    Shakiness, tremors  . Bupropion Hcl Other (See Comments)    "cloud over me" depression  . Caffeine Other (See Comments)    jitters  . Codeine Sulfate Other (See Comments)    Makes chest hurt like a heart attack  . Lisinopril Cough  . Metformin Nausea And Vomiting  . Mometasone Furoate Nausea And Vomiting  . Morphine Sulfate Other (See Comments)    Chest pain like a heart attack  . Other Other (See Comments)     Beta Blockers, reaction shortness of breath  . Oxycodone-Acetaminophen Nausea And Vomiting  . Pioglitazone Other (See Comments)    Cannot take because of risk of bladder cancer  . Propoxyphene N-Acetaminophen Nausea And Vomiting  . Rosuvastatin Other (See Comments)    Muscle aches and weakness  . Shellfish Allergy Diarrhea  . Tramadol Nausea Only  . Venlafaxine Other (See Comments)    Unknown reaction  . Zolpidem Tartrate Other (See Comments)     Jittery, diarrhea  . Latex Rash   Past Medical History:  Diagnosis Date  . ACE-inhibitor cough   . Allergic rhinitis   . Anemia    iron deficiency anemia  . Anxiety   . Arthritis   . Asthma   . Cataract   . Chronic depression   . Chronic headache   . COPD (chronic obstructive pulmonary disease) ([place])   . Coronary artery disease    a. s/p CABG x2 (  LIMA-LAD and SVG-RCA)  b. NST neg for ischemia 07/24/14  . Diabetes mellitus    type 2  . Diverticulitis of colon   . GERD (gastroesophageal reflux disease)   . H/O aortic valve replacement   . Hearing loss   . History of blood transfusion 2013  . HOH (hard of hearing)   . Hypercholesterolemia    intolerance of statins and niaspan  . OSA (obstructive sleep apnea)    mild, intolerant of cpap  . PONV (postoperative nausea and vomiting)   . Thyroid disease    Family History  Problem Relation Age of Onset  . Breast cancer Mother 38  . Hypertension Father   . Mesothelioma Father   . Asthma Father   . Stroke Paternal Grandfather   . Heart disease    . Breast cancer Maternal Aunt   . Breast cancer Paternal Aunt    Past Surgical History:  Procedure Laterality Date  . ABDOMINAL HYSTERECTOMY    . ABDOMINAL HYSTERECTOMY W/ PARTIAL VAGINACTOMY    . AORTIC VALVE REPLACEMENT N/A 10/12/2013   Procedure: AORTIC VALVE REPLACEMENT (AVR);  Surgeon: [name], MD;  Location: [facility];  Service: Open Heart Surgery;  Laterality: N/A;  . [place]  . BARTHOLIN GLAND CYST EXCISION     . BLADDER SUSPENSION    . BREAST BIOPSY Bilateral 09/11/2000   neg  . BREAST BIOPSY Left 07/24/2010   neg  . BREAST CYST EXCISION  1988   bilateral nonmalignant tumors, x3  . CARDIAC CATHETERIZATION    . CARDIAC CATHETERIZATION N/A 05/25/2016   Procedure: Coronary Balloon Angioplasty;  Surgeon: Leonie Man, MD;  Location: [place] CV LAB;  Service: Cardiovascular;  Laterality: N/A;  . CARDIAC CATHETERIZATION N/A 05/25/2016   Procedure: Coronary/Graft Angiography;  Surgeon: Leonie Man, MD;  Location: [place] CV LAB;  Service: Cardiovascular;  Laterality: N/A;  . CATARACT EXTRACTION W/ INTRAOCULAR LENS  IMPLANT, BILATERAL    . CHOLECYSTECTOMY  2001  . COLECTOMY     lap sigmoid  . COLONOSCOPY  2014   polyps found, 2 clamped off.  . CORONARY ANGIOPLASTY  10/29/2007   Prox RCA & Mid Cx.  . CORONARY ARTERY BYPASS GRAFT N/A 10/12/2013   Procedure: CORONARY ARTERY BYPASS GRAFT TIMES TWO;  Surgeon: [name], MD;  Location: [place];  Service: Open Heart Surgery;  Laterality: N/A;  . LEFT HEART CATHETERIZATION WITH CORONARY ANGIOGRAM N/A 10/09/2013   Procedure: LEFT HEART CATHETERIZATION WITH CORONARY ANGIOGRAM;  Surgeon: [name], MD;  Location: [facility] CATH LAB;  Service: Cardiovascular;  Laterality: N/A;  . STERNAL WIRES REMOVAL N/A 04/13/2014   Procedure: STERNAL WIRES REMOVAL;  Surgeon: [name], MD;  Location: [facility] OR;  Service: Thoracic;  Laterality: N/A;  . THYROIDECTOMY    . TONSILLECTOMY    . TUBAL LIGATION    . VAGINAL DELIVERY     3   Social History   Social History  . Marital status: Married    Spouse name: N/A  . Number of children: 3  . Years of education: N/A   Occupational History  . 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 Smoking status: Former Smoker    Packs/day: 0.50    Years: 30.00    Types: Cigarettes    Quit date: 10/02/2013  . Smokeless tobacco: Never Used  . Alcohol use No  . Drug use: No  . Sexual  activity: Not on file  Other Topics Concern  . Not on file   Social History Narrative   Does not have Living Will   Desires CPR, would not want prolonged life support if futile.     Review of Systems: General: negative for chills, fever, night sweats or weight changes.  Cardiovascular: negative for chest pain, dyspnea on exertion, edema, orthopnea, palpitations, paroxysmal nocturnal dyspnea or shortness of breath Dermatological: negative for rash Respiratory: negative for cough or wheezing Urologic: negative for hematuria Abdominal: negative for nausea, vomiting, diarrhea, bright red blood per rectum, melena, or hematemesis Neurologic: negative for visual changes, syncope, or dizziness All other systems reviewed and are otherwise negative except as noted above.   Physical Exam:  Blood pressure (!) 150/78, pulse 64, height 5\' 6"  (1.676 m), weight 193 lb (87.5 kg).  General appearance: alert, cooperative and anxious Neck: no carotid bruit and no JVD Lungs: clear to auscultation bilaterally Heart: regular rate and rhythm, S1, S2 normal, no murmur, click, rub or gallop Extremities: extremities normal, atraumatic, no cyanosis or edema Pulses: 2+ and symmetric Skin: Skin color, texture, turgor normal. No rashes or lesions Neurologic: Grossly normal  EKG NSR with 1st degree AV block  ASSESSMENT AND PLAN:   1. CAD: s/p CABG x 2 and tissue AVR 2015, s/p cardiac cath on 05/25/16 Culprit Lesion: Prox Cx Stent, 80 %stenosed. In-Stent Re-Stenosis, Patent LIMA-LAD which fills the diagonal branch as well, also patent SVG-distal RCA fills both branches. S/p PTCA of LCx, 12/22.  As outlined above, difficult situation regarding noncompliance with DAPT with ASA + Brilinta, due to side effects. She is not a Plavix responder and refuses to change PPI from Nexium to Protonix. P2Y12 was checked and found to be 301 (literature has shown a direct correlation of PRU values over 230 with higher risks of  thrombotic events). Effient is too expensive. The only thing she will take is Plavix. Dr. Angelena Form was notified. I discussed patient case with him. We will plan to restart Plavix, despite her P2Y12 level and concomitant use of Nexium, as this is better than monotherapy with ASA along. The patient was informed of her risk of reocclusion w/o adequate platelet inhibition. She understands this risk and continues to refuse Brilinta. Restart Plavix 75 mg daily with 81 mg of ASA.    2. [place] type I AV block: Beta blocker was held on discharge. Patient notes dizziness in clinic today. Very bothersome for her and has continued since discharge. EKG shows NSR with 1st degree AV block. Suspect possible vertigo, however we will plan for an  outpatient monitor to r/o recurrent, intermittent bradycardia or pauses. Will also check CBC and BMP.   3. H/o Aortic Insufficiency: s/p bioprosthetic AVR in 2015.  PLAN  F/u in 4 weeks for repeat assessment.   Brittainy Simmons PA-C 06/07/2016 11:14 AM

## 2016-06-08 LAB — CBC
Hematocrit: 35.4 % (ref 34.0–46.6)
Hemoglobin: 11.7 g/dL (ref 11.1–15.9)
MCH: 26.8 pg (ref 26.6–33.0)
MCHC: 33.1 g/dL (ref 31.5–35.7)
MCV: 81 fL (ref 79–97)
PLATELETS: 210 10*3/uL (ref 150–379)
RBC: 4.36 x10E6/uL (ref 3.77–5.28)
RDW: 15.5 % — AB (ref 12.3–15.4)
WBC: 9.4 10*3/uL (ref 3.4–10.8)

## 2016-06-08 LAB — BASIC METABOLIC PANEL
BUN / CREAT RATIO: 19 (ref 12–28)
BUN: 15 mg/dL (ref 8–27)
CALCIUM: 8.8 mg/dL (ref 8.7–10.3)
CHLORIDE: 100 mmol/L (ref 96–106)
CO2: 24 mmol/L (ref 18–29)
Creatinine, Ser: 0.8 mg/dL (ref 0.57–1.00)
GFR, EST AFRICAN AMERICAN: 86 mL/min/{1.73_m2} (ref >59)
GFR, EST NON AFRICAN AMERICAN: 74 mL/min/{1.73_m2} (ref >59)
Glucose: 92 mg/dL (ref 65–99)
POTASSIUM: 4.3 mmol/L (ref 3.5–5.2)
SODIUM: 138 mmol/L (ref 134–144)

## 2016-06-11 ENCOUNTER — Telehealth: Payer: Self-pay | Admitting: *Deleted

## 2016-06-11 ENCOUNTER — Telehealth: Payer: Self-pay | Admitting: Cardiology

## 2016-06-11 NOTE — Telephone Encounter (Signed)
Pt called back and stated that she would try the Effient. I explained to her that you wanted me to let you know before any was sent to her pharmacy and as soon as I heard back from you, I would call them back.  Please advise!

## 2016-06-11 NOTE — Telephone Encounter (Signed)
Called pt to let her know that her copay for the Effient would be $45 a month and she advised that she is supposed to see you again this month and yall would discuss it at that time.

## 2016-06-11 NOTE — Telephone Encounter (Signed)
[name] is returning your call. Thanks.

## 2016-06-11 NOTE — Telephone Encounter (Signed)
-----   Message from Michae Kava, [place] sent at 06/11/2016  8:58 AM EST ----- Regarding: FW: Co-Pay for Effient Good morning Anderson [place],  Here is the pt that we just spoke about.  Arbie Cookey  ----- Message ----- From: Consuelo Pandy, PA-C Sent: 06/08/2016   5:14 PM To: Michae Kava, CMA, Brett Canales, LPN Subject: RE: Co-Pay for Effient                         Thank you Vaughan Basta!  Arbie Cookey, do you mind checking in with the patient next week to notify her of the cost Effient would be. If able to afford, this would be better than Plavix. If she decides to make the change, please notify me before calling in a Rx.   ----- Message ----- From: Brett Canales, LPN Sent: [id number]   1:35 PM To: Michae Kava, [place], Brittainy Erie Noe, PA-C Subject: RE: Co-Pay for [place],  I guess you know I was out on Thursday. Effient 10mg  would be $45. For a 30 day supply( one daily I'm guessing) and no PA required ! Sending this to [place] as well.  Vaughan Basta ----- Message ----- From: Michae Kava, CMA Sent: 06/07/2016   8:51 AM To: Brett Canales, LPN, # Subject: Co-Pay for Effient                             Christie Beckers,  I am working with Ellen Henri, [place] today. Marye Round is inquiring to see if you would be able to find out what the co-pay would be for this pt for Effient. Will you please let Tanzania simmons, PA know what you find out. I thank you for all of your help in this matter.  Thank you Arbie Cookey

## 2016-06-12 NOTE — Telephone Encounter (Signed)
Follow Up:   Husband called,he was told that the nurse was waiting to hear from pt's provider.

## 2016-06-14 ENCOUNTER — Telehealth: Payer: Self-pay | Admitting: Cardiology

## 2016-06-14 NOTE — Telephone Encounter (Signed)
She has been notified regarding the cost of Effient. That was the only thing we were waiting on. Her lab results were returned last week.

## 2016-06-14 NOTE — Telephone Encounter (Signed)
I spoke with our pharmacist. I will route to Dr. Angelena Form to see if patient will need to do an Effient Load. If not, we can just start 10 mg daily. For now continue Plavix. Once Dr. Angelena Form determines initial starting dose, we will notify patient. Given her cath was <30 days ago, her first dose may need to be higher than her regular daily dose.

## 2016-06-14 NOTE — Telephone Encounter (Signed)
Called pt to let them know that she is to continue on the Plavix until she hears back from [place] re: if she needs to load up on Effient or not from Dr. Angelena Form.  Explained to husband as well and they verbalized understanding.

## 2016-06-14 NOTE — Telephone Encounter (Signed)
Patient is returning your call.    Thanks

## 2016-06-14 NOTE — Telephone Encounter (Signed)
lmptcb 06/14/16

## 2016-06-15 ENCOUNTER — Telehealth: Payer: Self-pay | Admitting: *Deleted

## 2016-06-15 MED ORDER — PRASUGREL HCL 10 MG PO TABS: 10.0000 mg | ORAL_TABLET | Freq: Every day | ORAL | 3 refills | Status: DC

## 2016-06-15 NOTE — Telephone Encounter (Signed)
-----   Message from Consuelo Pandy, [place] sent at 06/15/2016  2:34 PM EST ----- Send Rx for Effient 10 mg daily. Stop Plavix once she starts taking Effient. Continue ASA 81 mg daily.

## 2016-06-15 NOTE — Telephone Encounter (Signed)
Called and spoke to pt and husband.  They are both aware that pt can just d/c the Plavix and start Effient the next day at 10 mg p o qd.  They both verbalized understanding and appreciation for the follow-up.  Rx sent into pts local [facility].

## 2016-06-22 ENCOUNTER — Encounter (HOSPITAL_COMMUNITY): Payer: Self-pay | Admitting: *Deleted

## 2016-06-25 ENCOUNTER — Telehealth: Payer: Self-pay | Admitting: Cardiology

## 2016-06-25 ENCOUNTER — Other Ambulatory Visit: Payer: Self-pay | Admitting: Physician Assistant

## 2016-06-25 NOTE — Telephone Encounter (Signed)
Patient requested early termination of [employer] MCT-1 patch monitor due to rash and sores at patch site.  Last recording 06/20/16.  [employer] prepared End of Service Report 06/21/16.  Report and recordings imported for Dr. Alyse Low review.

## 2016-06-25 NOTE — Telephone Encounter (Signed)
Follow Up:     Pt had to return her event monitor after 2 weeks. Pt had a rash and sores where she had the electrodes.

## 2016-06-28 ENCOUNTER — Encounter: Payer: Self-pay | Admitting: Cardiology

## 2016-06-28 ENCOUNTER — Ambulatory Visit (INDEPENDENT_AMBULATORY_CARE_PROVIDER_SITE_OTHER): Payer: Medicare Other | Admitting: Cardiology

## 2016-06-28 VITALS — BP 140/86 | HR 65 | Ht 66.0 in | Wt 196.0 lb

## 2016-06-28 DIAGNOSIS — R001 Bradycardia, unspecified: Secondary | ICD-10-CM | POA: Diagnosis not present

## 2016-06-28 NOTE — Progress Notes (Signed)
06/28/2016 [name]   04-02-1946  [id number]  Primary Physician Singh,Jasmine, MD Primary Cardiologist: Dr. Angelena Form    Reason for Visit/CC: CAD and Bradycardia   HPI:  71 year old female with history of 3 vessel CAD s/p 2 vessel CABG (LIMA-LAD, SVG-RCA), severe AR s/p bioprosthetic AVR, DM2, COPD and chronic chest pain who presented to [facility] on 05/24/16 with chest pain c/w unstable angina. She was admitted and ruled out for MI. Given her history and nature of symptoms, definitive LHC was recommended.   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 This was treated successfully with PTCA only, leaving a residual 10-50% stenosis that was similar to the remainder of the stented segment. She was noted to have patent LIMA-LAD which filled the diagonal branch as well, also with patent SVG-distal RVA. Of note she had Wenkebach on EKG post PCI, it was uncertain if this was playing a role in her palpitations. She had significant reflux from Plavix treated with Pepcid. Post PCI she did have some residual angina that was successfully treated with nitro gtt and weaned off without any further angina. P2Y12 was checked and found to be 301. This was discussed with Dr. Gwenlyn Found and she was changed to [facility] on his recommendation. She did not tolerate to Brilinta due to dyspnea. She was transitioned to Effient.   In addition, patient has had issues with frequent dizziness. During her hospitalization, she was noted to have SR with 1st degree AVB w/ intermittent Wenckebach. Her BB was held and symptoms improved. Her metoprolol was discontinued at discharge. Dr. Meda Coffee suggested possible evaluation with an outpatient monitor to reassess her bradycardia/ screen for other arrhthymias is still symptomatic.  I evaluated her in clinic for post hospital f/u on 06/07/16. She denied and recurrent CP but continued to have dizziness.  Orthostatics were negative. Her resting HR was in the 60s. I ordered for her to be evaluated with a ambulatory cardiac monitor to assess for significant bradycardia. Her results were reviewed by Dr. Angelena Form. She was noted to have SR with 1st degree AVB, 2nd degree AVB, type 1 w/o symptoms reported during theses events, sinus bradycardia with occasional junctional rhythm and rare PACs. Dr. Angelena Form has recommended referral to EP clinic for further evaluation.  She presents back to clinic today for f/u. She is here with her husband. She denies ischemic chest pain but she continues to feel poorly with intermittent dizziness and fatigue. She denies syncope/ near syncope. EKG today shows NSR with 1st degree AVB. HR is 65 bpm. Recent labs have shown normal electrolytes and thyroid function. She reports full compliance with ASA and Effient. She is tolerating Effient ok w/o side effects.    Current Meds  Medication Sig  . albuterol (PROVENTIL HFA;VENTOLIN HFA) 108 (90 Base) MCG/ACT inhaler Inhale 2 puffs into the lungs 2 (two) times daily at 10 AM and 5 PM. (Patient taking differently: Inhale 2 puffs into the lungs every 6 (six) hours as needed for wheezing or shortness of breath. )  . aspirin EC 81 MG tablet Take 81 mg by mouth daily.  . Calcium-Vitamin D (CALTRATE 600 PLUS-VIT D PO) Take 2 tablets by mouth 2 (two) times daily.   Marland Kitchen dimenhyDRINATE (DRAMAMINE) 50 MG tablet Take 50 mg by mouth every 6 (six) hours as needed.   Marland Kitchen esomeprazole (NEXIUM) 40 MG capsule Take 40 mg by mouth daily at  12 noon.   Marland Kitchen FLUoxetine (PROZAC) 40 MG capsule Take 40 mg by mouth 2 (two) times daily.  . insulin NPH-regular Human (NOVOLIN 70/30) (70-30) 100 UNIT/ML injection Inject 70 Units into the skin 2 (two) times daily.  Marland Kitchen levothyroxine (SYNTHROID, LEVOTHROID) 150 MCG tablet Take 150 mcg by mouth daily before breakfast.  . nitroGLYCERIN (NITROSTAT) 0.4 MG SL tablet Place 0.4 mg under the tongue every 5 (five) minutes as needed  for chest pain.  [name] DELICA LANCETS 99991111 MISC USE TO CHECK BLOOD SUGAR THREE TIMES DAILY  . prasugrel (EFFIENT) 10 MG TABS tablet Take 1 tablet (10 mg total) by mouth daily.  . traZODone (DESYREL) 100 MG tablet TAKE TWO TABLETS BY MOUTH AT BEDTIME   Allergies  Allergen Reactions  . Amitriptyline Other (See Comments)    Unknown reaction  . Benadryl [Diphenhydramine] Shortness Of Breath  . Demerol [Meperidine] Other (See Comments)    Unknown reaction  . Gabapentin Other (See Comments)    Unknown reaction  . Loratadine Other (See Comments)    Unknown reaction  . Meperidine Hcl Other (See Comments)    Unknown reaction  . Mirtazapine Other (See Comments)    Unknown reaction  . Olanzapine Other (See Comments)    Unknown reaction   . Voltaren [Diclofenac Sodium] Shortness Of Breath  . Zetia [Ezetimibe] Other (See Comments)    Weakness in legs, shakiness all over  . Ativan [Lorazepam] Other (See Comments)    Causes double vision at highter than .5 mg dose  . Atorvastatin Other (See Comments)    Muscle aches and weakness  . Budesonide-Formoterol Fumarate Other (See Comments)    Shakiness, tremors  . Bupropion Hcl Other (See Comments)    "cloud over me" depression  . Caffeine Other (See Comments)    jitters  . Codeine Sulfate Other (See Comments)    Makes chest hurt like a heart attack  . Lisinopril Cough  . Metformin Nausea And Vomiting  . Mometasone Furoate Nausea And Vomiting  . Morphine Sulfate Other (See Comments)    Chest pain like a heart attack  . Other Other (See Comments)    Beta Blockers, reaction shortness of breath  . Oxycodone-Acetaminophen Nausea And Vomiting  . Pioglitazone Other (See Comments)    Cannot take because of risk of bladder cancer  . Propoxyphene N-Acetaminophen Nausea And Vomiting  . Rosuvastatin Other (See Comments)    Muscle aches and weakness  . Shellfish Allergy Diarrhea  . Tramadol Nausea Only  . Venlafaxine Other (See Comments)     Unknown reaction  . Zolpidem Tartrate Other (See Comments)     Jittery, diarrhea  . Latex Rash   Past Medical History:  Diagnosis Date  . ACE-inhibitor cough   . Allergic rhinitis   . Anemia    iron deficiency anemia  . Anxiety   . Arthritis   . Asthma   . Cataract   . Chronic depression   . Chronic headache   . COPD (chronic obstructive pulmonary disease) ([place])   . Coronary artery disease    a. s/p CABG x2 (LIMA-LAD and SVG-RCA)  b. NST neg for ischemia 07/24/14  . Diabetes mellitus    type 2  . Diverticulitis of colon   . GERD (gastroesophageal reflux disease)   . H/O aortic valve replacement   . Hearing loss   . History of blood transfusion 2013  . HOH (hard of hearing)   . Hypercholesterolemia    intolerance of statins and  niaspan  . OSA (obstructive sleep apnea)    mild, intolerant of cpap  . PONV (postoperative nausea and vomiting)   . Thyroid disease    Family History  Problem Relation Age of Onset  . Breast cancer Mother 13  . Hypertension Father   . Mesothelioma Father   . Asthma Father   . Stroke Paternal Grandfather   . Heart disease    . Breast cancer Maternal Aunt   . Breast cancer Paternal Aunt    Past Surgical History:  Procedure Laterality Date  . ABDOMINAL HYSTERECTOMY    . ABDOMINAL HYSTERECTOMY W/ PARTIAL VAGINACTOMY    . AORTIC VALVE REPLACEMENT N/A 10/12/2013   Procedure: AORTIC VALVE REPLACEMENT (AVR);  Surgeon: [name], MD;  Location: [place];  Service: Open Heart Surgery;  Laterality: N/A;  . [place]  . BARTHOLIN GLAND CYST EXCISION    . BLADDER SUSPENSION    . BREAST BIOPSY Bilateral 09/11/2000   neg  . BREAST BIOPSY Left 07/24/2010   neg  . BREAST CYST EXCISION  1988   bilateral nonmalignant tumors, x3  . CARDIAC CATHETERIZATION    . CARDIAC CATHETERIZATION N/A 05/25/2016   Procedure: Coronary Balloon Angioplasty;  Surgeon: Leonie Man, MD;  Location: [place] CV LAB;  Service: Cardiovascular;  Laterality:  N/A;  . CARDIAC CATHETERIZATION N/A 05/25/2016   Procedure: Coronary/Graft Angiography;  Surgeon: Leonie Man, MD;  Location: [place] [facility];  Service: Cardiovascular;  Laterality: N/A;  . CATARACT EXTRACTION W/ INTRAOCULAR LENS  IMPLANT, BILATERAL    . CHOLECYSTECTOMY  2001  . COLECTOMY     lap sigmoid  . COLONOSCOPY  2014   polyps found, 2 clamped off.  . CORONARY ANGIOPLASTY  10/29/2007   Prox RCA & Mid Cx.  . CORONARY ARTERY BYPASS GRAFT N/A 10/12/2013   Procedure: CORONARY ARTERY BYPASS GRAFT TIMES TWO;  Surgeon: [name], MD;  Location: [place];  Service: Open Heart Surgery;  Laterality: N/A;  . LEFT HEART CATHETERIZATION WITH CORONARY ANGIOGRAM N/A 10/09/2013   Procedure: LEFT HEART CATHETERIZATION WITH CORONARY ANGIOGRAM;  Surgeon: [name], MD;  Location: [facility] CATH LAB;  Service: Cardiovascular;  Laterality: N/A;  . STERNAL WIRES REMOVAL N/A 04/13/2014   Procedure: STERNAL WIRES REMOVAL;  Surgeon: [name], MD;  Location: [facility] OR;  Service: Thoracic;  Laterality: N/A;  . THYROIDECTOMY    . TONSILLECTOMY    . TUBAL LIGATION    . VAGINAL DELIVERY     3   Social History   Social History  . Marital status: Married    Spouse name: N/A  . Number of children: 3  . Years of education: N/A   Occupational History  . 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 Smoking status: Former Smoker    Packs/day: 0.50    Years: 30.00    Types: Cigarettes    Quit date: 10/02/2013  . Smokeless tobacco: Never Used  . Alcohol use No  . Drug use: No  . Sexual activity: Not on file   Other Topics Concern  . Not on file   Social History Narrative   Does not have Living Will   Desires CPR, would not want prolonged life support if futile.     Review of Systems: General: negative for chills, fever, night sweats or weight changes.  Cardiovascular: negative for chest pain, dyspnea on exertion, edema, orthopnea, palpitations, paroxysmal nocturnal  dyspnea or shortness  of breath Dermatological: negative for rash Respiratory: negative for cough or wheezing Urologic: negative for hematuria Abdominal: negative for nausea, vomiting, diarrhea, bright red blood per rectum, melena, or hematemesis Neurologic: negative for visual changes, syncope, or dizziness All other systems reviewed and are otherwise negative except as noted above.   Physical Exam:  Blood pressure 140/86, pulse 65, height 5\' 6"  (1.676 m), weight 196 lb (88.9 kg), SpO2 96 %.  General appearance: alert, cooperative and no distress Neck: no carotid bruit and no JVD Lungs: clear to auscultation bilaterally Heart: regular rate and rhythm, S1, S2 normal, no murmur, click, rub or gallop Extremities: extremities normal, atraumatic, no cyanosis or edema Pulses: 2+ and symmetric Skin: Skin color, texture, turgor normal. No rashes or lesions Neurologic: Alert and oriented X 3, normal strength and tone. Normal symmetric reflexes. Normal coordination and gait  EKG NSR with 1st degree AVB 65 bpm   ASSESSMENT AND PLAN:  1. CAD: s/p CABG x 2 and tissue AVR 2015, s/p cardiac cath on 05/25/16 Culprit Lesion: Prox Cx Stent, 80 %stenosed. In-Stent Re-Stenosis, Patent LIMA-LAD which fills the diagonal branch as well, also patent SVG-distal RCA fills both branches. S/p PTCA of LCx, 12/22. She is not a Plavix responder.  P2Y12 was checked and found to be 301 (literature has shown a direct correlation of PRU values over 230 with higher risks of thrombotic events). She did not tolerate Brilinta due to dyspnea. She has since been transitioned to Effient and doing ok with this. No recurrent CP. Continue DAPT with ASA + Effient.  No BB given bradycardia.    2. Symptomatic Bradycardia: recent outpatient monitor demonstrated SR with 1st degree AVB, 2nd degree AVB, type 1 w/o symptoms reported during theses events, sinus bradycardia with occasional junctional rhythm and rare PACs. She is not on any AV  nodal blocking agents. Recent labs have shown normal electrolytes and thyroid function. She remains symptomatic with dizziness and fatigue. She denies syncope/ near syncope. Dr. Angelena Form has recommended referral to EP clinic for further evaluation. We will arrange this today.    3. H/o Aortic Insufficiency: s/p bioprosthetic AVR in 2015.   PLAN  Will place referral to EP clinic for bradycardia. Keep F/u with Dr. Angelena Form in March.   Kinzi Frediani PA-C 06/28/2016 9:12 AM

## 2016-06-28 NOTE — Patient Instructions (Signed)
Medication Instructions:   Your physician recommends that you continue on your current medications as directed. Please refer to the Current Medication list given to you today.   If you need a refill on your cardiac medications before your next appointment, please call your pharmacy.  Labwork: NONE ORDERED  TODAY    Testing/Procedures: NONE ORDERED  TODAY    Follow-Up:  NEED TO BE SEEN IN EP AS NEW PATIENT  PER [facility] FOR  BRADYCARDIA AND 2ND DEGREE HEART BLOCK    Any Other Special Instructions Will Be Listed Below (If Applicable).

## 2016-06-29 ENCOUNTER — Encounter: Payer: Self-pay | Admitting: *Deleted

## 2016-07-02 ENCOUNTER — Encounter: Payer: Self-pay | Admitting: Internal Medicine

## 2016-07-02 ENCOUNTER — Ambulatory Visit (INDEPENDENT_AMBULATORY_CARE_PROVIDER_SITE_OTHER): Payer: Medicare Other | Admitting: Internal Medicine

## 2016-07-02 VITALS — BP 122/74 | HR 68 | Ht 66.0 in | Wt 196.8 lb

## 2016-07-02 DIAGNOSIS — R001 Bradycardia, unspecified: Secondary | ICD-10-CM

## 2016-07-02 NOTE — Patient Instructions (Signed)
Medication Instructions: - Your physician recommends that you continue on your current medications as directed. Please refer to the Current Medication list given to you today.  Labwork: - none ordered  Procedures/Testing: - none ordered  Follow-Up: - Dr. Klein will see you back on an as needed basis.  Any Additional Special Instructions Will Be Listed Below (If Applicable).     If you need a refill on your cardiac medications before your next appointment, please call your pharmacy.   

## 2016-07-02 NOTE — Progress Notes (Signed)
ELECTROPHYSIOLOGY CONSULT NOTE  Patient ID: [name], MRN: [id number], DOB/AGE: Jul 02, 1945 71 y.o. Admit date: (Not on file) Date of Consult: 07/02/2016  Primary Physician: Megan Axe, MD Primary Cardiologist: [name]  Consulting Physician [name] ?BS  Chief Complaint: abnormal event recorder and shortness of breath    HPI [name] is a 71 y.o. female  Referred because of dyspnea on exertion some dizziness and an event recorder that showed second-degree type I heart block the only recorded episode of symptoms was associated with sinus rhythm.    Cardiac history includes 2 vessel bypass surgery and bioprosthetic aVR for mild-moderate AI S and moderate-severe AR.  She has had long-standing problems with shortness of breath. They worsened significantly in December 2017 which prompted admission to the hospital; it was accompanied at that time by midsternal chest discomfort.  Troponins were negative but CK-MB increased to 8.2. 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  Relation to her dyspnea is should be noted that she was unable to tolerate ticagrelor. She was changed to prasugrel  She continued to complain of dizziness. Her beta blockers in hospital were discontinued because of the Mobitz 1 heart block. She is given an outpatient monitor. This showed sinus rhythm. It is noted above the one episode with symptoms was associated with sinus rhythm.  She presents today with the above complaints. She and her husband are expecting the recommendation of the pacemaker with anticipated resolution of her symptoms..     Past Medical History:  Diagnosis Date  . ACE-inhibitor cough   . Allergic rhinitis   . Anemia    iron deficiency anemia  . Anxiety   . Arthritis   . Asthma   . Cataract   . Chronic depression   . Chronic headache    . COPD (chronic obstructive pulmonary disease) ([place])   . Coronary artery disease    a. s/p CABG x2 (LIMA-LAD and SVG-RCA)  b. NST neg for ischemia 07/24/14  . Diabetes mellitus    type 2  . Diverticulitis of colon   . GERD (gastroesophageal reflux disease)   . H/O aortic valve replacement   . Hearing loss   . History of blood transfusion 2013  . HOH (hard of hearing)   . Hypercholesterolemia    intolerance of statins and niaspan  . OSA (obstructive sleep apnea)    mild, intolerant of cpap  . PONV (postoperative nausea and vomiting)   . Thyroid disease       Surgical History:  Past Surgical History:  Procedure Laterality Date  . ABDOMINAL HYSTERECTOMY    . ABDOMINAL HYSTERECTOMY W/ PARTIAL VAGINACTOMY    . AORTIC VALVE REPLACEMENT N/A 10/12/2013   Procedure: AORTIC VALVE REPLACEMENT (AVR);  Surgeon: Megan Pollack, MD;  Location: [place];  Service: Open Heart Surgery;  Laterality: N/A;  . [place]  . BARTHOLIN GLAND CYST EXCISION    . BLADDER SUSPENSION    . BREAST BIOPSY Bilateral 09/11/2000   neg  . BREAST BIOPSY Left 07/24/2010   neg  . BREAST CYST EXCISION  1988   bilateral nonmalignant tumors, x3  . CARDIAC CATHETERIZATION    . CARDIAC CATHETERIZATION N/A 05/25/2016   Procedure: Coronary Balloon Angioplasty;  Surgeon: Megan Man, MD;  Location: [place] [facility];  Service: Cardiovascular;  Laterality: N/A;  . CARDIAC CATHETERIZATION  N/A 05/25/2016   Procedure: Coronary/Graft Angiography;  Surgeon: Megan Man, MD;  Location: [place] [facility];  Service: Cardiovascular;  Laterality: N/A;  . CATARACT EXTRACTION W/ INTRAOCULAR LENS  IMPLANT, BILATERAL    . CHOLECYSTECTOMY  2001  . COLECTOMY     lap sigmoid  . COLONOSCOPY  2014   polyps found, 2 clamped off.  . CORONARY ANGIOPLASTY  10/29/2007   Prox RCA & Mid Cx.  . CORONARY ARTERY BYPASS GRAFT N/A 10/12/2013   Procedure: CORONARY ARTERY BYPASS GRAFT TIMES TWO;  Surgeon: Megan Pollack, MD;  Location:  [place];  Service: Open Heart Surgery;  Laterality: N/A;  . LEFT HEART CATHETERIZATION WITH CORONARY ANGIOGRAM N/A 10/09/2013   Procedure: LEFT HEART CATHETERIZATION WITH CORONARY ANGIOGRAM;  Surgeon: [name], MD;  Location: [facility];  Service: Cardiovascular;  Laterality: N/A;  . STERNAL WIRES REMOVAL N/A 04/13/2014   Procedure: STERNAL WIRES REMOVAL;  Surgeon: Megan Pollack, MD;  Location: [facility];  Service: Thoracic;  Laterality: N/A;  . THYROIDECTOMY    . TONSILLECTOMY    . TUBAL LIGATION    . VAGINAL DELIVERY     3     Home Meds: Prior to Admission medications   Medication Sig Start Date End Date Taking? Authorizing Provider  albuterol (PROVENTIL HFA;VENTOLIN HFA) 108 (90 Base) MCG/ACT inhaler Inhale 2 puffs into the lungs 2 (two) times daily at 10 AM and 5 PM. Patient taking differently: Inhale 2 puffs into the lungs every 6 (six) hours as needed for wheezing or shortness of breath.  07/22/15   Megan Lipps, MD  aspirin EC 81 MG tablet Take 81 mg by mouth daily.    Historical Provider, MD  Calcium-Vitamin D (CALTRATE 600 PLUS-VIT D PO) Take 2 tablets by mouth 2 (two) times daily.     Historical Provider, MD  dimenhyDRINATE (DRAMAMINE) 50 MG tablet Take 50 mg by mouth every 6 (six) hours as needed.     Historical Provider, MD  esomeprazole (NEXIUM) 40 MG capsule Take 40 mg by mouth daily at 12 noon.     Historical Provider, MD  FLUoxetine (PROZAC) 40 MG capsule Take 40 mg by mouth 2 (two) times daily. 07/29/15   Historical Provider, MD  insulin NPH-regular Human (NOVOLIN 70/30) (70-30) 100 UNIT/ML injection Inject 70 Units into the skin 2 (two) times daily.    Historical Provider, MD  levothyroxine (SYNTHROID, LEVOTHROID) 150 MCG tablet Take 150 mcg by mouth daily before breakfast.    Historical Provider, MD  nitroGLYCERIN (NITROSTAT) 0.4 MG SL tablet Place 0.4 mg under the tongue every 5 (five) minutes as needed for chest pain.    Historical Provider, MD  [facility] DELICA  LANCETS 06Y MISC USE TO CHECK BLOOD SUGAR THREE TIMES DAILY 05/17/14   Historical Provider, MD  prasugrel (EFFIENT) 10 MG TABS tablet Take 1 tablet (10 mg total) by mouth daily. 06/15/16 09/13/16  Brittainy Erie Noe, PA-C  traZODone (DESYREL) 100 MG tablet TAKE TWO TABLETS BY MOUTH AT BEDTIME 08/15/15   Lucille Passy, MD    Allergies:  Allergies  Allergen Reactions  . Amitriptyline Other (See Comments)    Unknown reaction  . Benadryl [Diphenhydramine] Shortness Of Breath  . Demerol [Meperidine] Other (See Comments)    Unknown reaction  . Gabapentin Other (See Comments)    Unknown reaction  . Loratadine Other (See Comments)    Unknown reaction  . Meperidine Hcl Other (See Comments)    Unknown reaction  .  Mirtazapine Other (See Comments)    Unknown reaction  . Olanzapine Other (See Comments)    Unknown reaction   . Voltaren [Diclofenac Sodium] Shortness Of Breath  . Zetia [Ezetimibe] Other (See Comments)    Weakness in legs, shakiness all over  . Ativan [Lorazepam] Other (See Comments)    Causes double vision at highter than .5 mg dose  . Atorvastatin Other (See Comments)    Muscle aches and weakness  . Budesonide-Formoterol Fumarate Other (See Comments)    Shakiness, tremors  . Bupropion Hcl Other (See Comments)    "cloud over me" depression  . Caffeine Other (See Comments)    jitters  . Codeine Sulfate Other (See Comments)    Makes chest hurt like a heart attack  . Lisinopril Cough  . Metformin Nausea And Vomiting  . Mometasone Furoate Nausea And Vomiting  . Morphine Sulfate Other (See Comments)    Chest pain like a heart attack  . Other Other (See Comments)    Beta Blockers, reaction shortness of breath  . Oxycodone-Acetaminophen Nausea And Vomiting  . Pioglitazone Other (See Comments)    Cannot take because of risk of bladder cancer  . Propoxyphene N-Acetaminophen Nausea And Vomiting  . Rosuvastatin Other (See Comments)    Muscle aches and weakness  . Shellfish  Allergy Diarrhea  . Tramadol Nausea Only  . Venlafaxine Other (See Comments)    Unknown reaction  . Zolpidem Tartrate Other (See Comments)     Jittery, diarrhea  . Latex Rash    Social History   Social History  . Marital status: Married    Spouse name: N/A  . Number of children: 3  . Years of education: N/A   Occupational History  . 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 Smoking status: Former Smoker    Packs/day: 0.50    Years: 30.00    Types: Cigarettes    Quit date: 10/02/2013  . Smokeless tobacco: Never Used  . Alcohol use No  . Drug use: No  . Sexual activity: Not on file   Other Topics Concern  . Not on file   Social History Narrative   Does not have Living Will   Desires CPR, would not want prolonged life support if futile.     Family History  Problem Relation Age of Onset  . Breast cancer Mother 39  . Hypertension Father   . Mesothelioma Father   . Asthma Father   . Stroke Paternal Grandfather   . Heart disease    . Breast cancer Maternal Aunt   . Breast cancer Paternal Aunt      ROS:  Please see the history of present illness.     All other systems reviewed and negative.    Physical Exam: Blood pressure 122/74, pulse 68, height 5' 6"  (1.676 m), weight 196 lb 12.8 oz (89.3 kg), SpO2 96 %. General: Well developed, well nourished female in no acute distress. Head: Normocephalic, atraumatic, sclera non-icteric, no xanthomas, nares are without discharge. EENT: normal  Lymph Nodes:  none Neck: Negative for carotid bruits. JVD not elevated. Back:without scoliosis kyphosis Lungs: Clear bilaterally to auscultation without wheezes, rales, or rhonchi. Breathing is unlabored. Heart: RRR with S1 S2. No   murmur . No rubs, or gallops appreciated. Abdomen: Soft, non-tender, non-distended with normoactive bowel sounds. No hepatomegaly. No rebound/guarding. No obvious abdominal masses. Msk:  Strength and tone appear normal for  age. Extremities: No clubbing or cyanosis. No  edema.  Distal pedal pulses are 2+ and equal bilaterally. Skin: Warm and Dry Neuro: Alert and oriented X 3. CN III-XII intact Grossly normal sensory and motor function . Psych:  Responds to questions appropriately ; she is intermittently tearful; she expresses frustration      Labs: Cardiac Enzymes No results for input(s): CKTOTAL, CKMB, TROPONINI in the last 72 hours. CBC Lab Results  Component Value Date   WBC 9.4 06/07/2016   HGB 10.3 (L) 05/27/2016   HCT 35.4 06/07/2016   MCV 81 06/07/2016   PLT 210 06/07/2016   PROTIME: No results for input(s): LABPROT, INR in the last 72 hours. Chemistry No results for input(s): NA, K, CL, CO2, BUN, CREATININE, CALCIUM, PROT, BILITOT, ALKPHOS, ALT, AST, GLUCOSE in the last 168 hours.  Invalid input(s): LABALBU Lipids Lab Results  Component Value Date   CHOL 206 (H) 03/16/2015   HDL 21 (L) 03/16/2015   LDLCALC 111 03/16/2015   TRIG 369 (H) 03/16/2015   BNP Pro B Natriuretic peptide (BNP)  Date/Time Value Ref Range Status  10/08/2013 08:45 PM 225.2 (H) 0 - 125 pg/mL Final   Thyroid Function Tests: No results for input(s): TSH, T4TOTAL, T3FREE, THYROIDAB in the last 72 hours.  Invalid input(s): FREET3 Miscellaneous Lab Results  Component Value Date   DDIMER 2.54 (H) 12/03/2013    Radiology/Studies:  No results found.  EKG: *06/28/2016  sinus rhythm at 65 Intervals 28[birth year]   Event recorder was reviewed. Personally. There are some episodes of nonsustained atrial tachycardia. She has some Wenckebach particularly at night. For one episode of symptoms was associated with sinus rhythm at 70.    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 Dizziness shortness of breath and chest discomfort dating back a number of weeks; and maybe months.  Not withstanding having undergone revascularization, most of her symptoms did not abate.  Bradycardia identified in the hospital seems largely gone now based on the monitoring.The bradycardia she had was nocturnal. I don't think that there is a rhythm issue responsible for this.     Some of her dyspnea could be related to chronotropic incompetence and stress testing may be of value.  The family is  very frustrated about the lack of therapeutic alternative offered today, they came anticipating pacing.  I spoke with Dr. Kendra Opitz today we will review her case and try and come up with next steps.  Her first degree AV block is not sufficiently long to justify intervention, her second-degree AV block as noted above is primarily nocturnal.      erwent catheterization and POBA of ISR in December; she had some residual disease. With a mid RCA CTO.         Virl [name]

## 2016-07-03 ENCOUNTER — Telehealth: Payer: Self-pay | Admitting: Cardiovascular Disease

## 2016-07-03 NOTE — Telephone Encounter (Signed)
Pt seen in EP clinic  Yesterday by Dr. Caryl Comes. I spoke to him about her current symptoms. He does not feel that her symptoms (dizziness, dyspnea,fatigue) are from her second degree AV block, bradycardia and does not feel that a pacemaker is indicated. I reviewed this with her on the phone today. Recent cardiac cath 05/25/16 with PCI of the Circumflex with a good result. Patent grafts to occluded LAD and occluded RCA. LV function is normal. Aortic valve replacement is functioning normally. She has not weight gain or LE edema. Rare chest pains. She was not orthostatic in the office yesterday or at last office visit.  It is unclear what is causing her symptoms, primarily dyspnea and dizziness. I do not think a cardiac cath is indicated. The question is if we should perform a stress test to look at HR response to exercise. Will discuss with Dr. Caryl Comes. Also possible that some of this is due to her COPD.  I told the patient that I would review with Dr. Caryl Comes and call her back this week.   [name]

## 2016-07-04 ENCOUNTER — Ambulatory Visit: Payer: Medicare Other | Admitting: Cardiovascular Disease

## 2016-07-05 ENCOUNTER — Other Ambulatory Visit: Payer: Self-pay | Admitting: *Deleted

## 2016-07-05 DIAGNOSIS — R0600 Dyspnea, unspecified: Secondary | ICD-10-CM

## 2016-07-10 ENCOUNTER — Other Ambulatory Visit: Payer: Self-pay | Admitting: Internal Medicine

## 2016-07-10 ENCOUNTER — Ambulatory Visit: Payer: Medicare Other | Admitting: Internal Medicine

## 2016-07-10 ENCOUNTER — Ambulatory Visit (INDEPENDENT_AMBULATORY_CARE_PROVIDER_SITE_OTHER): Payer: Medicare Other

## 2016-07-10 DIAGNOSIS — R0600 Dyspnea, unspecified: Secondary | ICD-10-CM

## 2016-07-17 ENCOUNTER — Other Ambulatory Visit: Payer: Self-pay | Admitting: *Deleted

## 2016-07-17 MED ORDER — ALBUTEROL SULFATE HFA 108 (90 BASE) MCG/ACT IN AERS: 2.0000 | INHALATION_SPRAY | RESPIRATORY_TRACT | 2 refills | Status: DC | PRN

## 2016-07-17 NOTE — Telephone Encounter (Signed)
Sent RX for [employer] instead of Ventolin due to insurance preference.

## 2016-07-27 LAB — EXERCISE TOLERANCE TEST
CHL RATE OF PERCEIVED EXERTION: 18
CSEPED: 1 min
CSEPEDS: 34 s
CSEPEW: 2 METS
MPHR: 149 {beats}/min
Peak HR: 104 {beats}/min
Percent HR: 69 %
Rest HR: 67 {beats}/min

## 2016-08-03 ENCOUNTER — Ambulatory Visit (INDEPENDENT_AMBULATORY_CARE_PROVIDER_SITE_OTHER): Payer: Medicare Other | Admitting: Cardiovascular Disease

## 2016-08-03 VITALS — BP 110/68 | HR 74 | Ht 66.0 in | Wt 196.0 lb

## 2016-08-03 DIAGNOSIS — R42 Dizziness and giddiness: Secondary | ICD-10-CM

## 2016-08-03 DIAGNOSIS — I25708 Atherosclerosis of coronary artery bypass graft(s), unspecified, with other forms of angina pectoris: Secondary | ICD-10-CM

## 2016-08-03 DIAGNOSIS — Z952 Presence of prosthetic heart valve: Secondary | ICD-10-CM | POA: Diagnosis not present

## 2016-08-03 DIAGNOSIS — E785 Hyperlipidemia, unspecified: Secondary | ICD-10-CM

## 2016-08-03 DIAGNOSIS — I10 Essential (primary) hypertension: Secondary | ICD-10-CM | POA: Diagnosis not present

## 2016-08-03 DIAGNOSIS — I359 Nonrheumatic aortic valve disorder, unspecified: Secondary | ICD-10-CM | POA: Diagnosis not present

## 2016-08-03 NOTE — Progress Notes (Signed)
Chief Complaint  Patient presents with  . Dizziness     History of Present Illness: 71 yo female with history of anemia, HTN, HLD, DM, COPD, CAD s/p prior stent placements followed by CABG and AVR May 2015 who is here today for cardiac follow up. She had a DES placed in the RCA and mid Circumflex in May 2009. She was admitted 10/2013 at [facility] with chest pain. Echo demonstrated normal LVEF, mod AI and mild AS. Myoview was negative for ischemia. She followed up with Dr. Ida Rogue in [place] with continued symptoms and was set up for [facility]. This demonstrated 3v CAD with severe prox LAD stenosis and ostial RCA. She was referred for CABG. Intraoperative TEE demonstrated mild AS and mod to severe AI. She underwent CABG with Dr. Cyndia Bent 10/12/13 (L-LAD, S-RCA) + AVR with a bioprosthetic valve (21 mm [employer]). Echo June 2015 with normal LVEF and normal AVR. She was seen in our office 12/03/13 by Richardson Dopp, PA-C and still having chest pain. Troponin was negative. D-dimer was elevated. Pt was sent to [facility] for CTA chest to exclude PE that evening. NO PE on chest CT but noted to have small left pleural effusion. Admitted to [facility] 07/26/14 with chest pain. Stress myoview February 2016 with no ischemia. She does not tolerate statins or Zetia. Seen in lipid clinic Feb 2016 and tried on low dose Pravachol but did not tolerate. She was admitted to [facility] December 2017 with unstable angina. Cardiac cath with patent LIMA to LAD and SVG to RCA but progression of stent restenosis in the proximal circumflex which was treated with cutting balloon angioplasty. She had Second degree AV block noted in the hospital. She did not tolerate Brilinta and was placedin Effient. She had continued dizziness post discharge and cardiac monitor showed sinus with 1st degree AV block, second degree AV block without diary notation of symptoms during these events. She was seen by Dr. Caryl Comes who did not feel that her bradycardia and heart  block was related to her dizziness. He suggested that her dyspnea may be related to chronotropic incompetence and suggested stress testing. Exercise stress test on 07/27/16 with 1 minute 36 seconds of exercise stopped due to dizziness. Heart rate responded normally to exercise.   She returns today for follow up. She denies SOB, PND, edema or syncope. She has continued dizziness when sitting and standing. This occurs every day. Rare chest pains responsive to NTG. Daily headaches. No visual changes.   Primary Care Physician: Glendon Axe, MD   Past Medical History:  Diagnosis Date  . ACE-inhibitor cough   . Allergic rhinitis   . Anemia    iron deficiency anemia  . Anxiety   . Arthritis   . Asthma   . Cataract   . Chronic depression   . Chronic headache   . COPD (chronic obstructive pulmonary disease) ([place])   . Coronary artery disease    a. s/p CABG x2 (LIMA-LAD and SVG-RCA)  b. NST neg for ischemia 07/24/14  . Diabetes mellitus    type 2  . Diverticulitis of colon   . GERD (gastroesophageal reflux disease)   . H/O aortic valve replacement   . Hearing loss   . History of blood transfusion 2013  . HOH (hard of hearing)   . Hypercholesterolemia    intolerance of statins and niaspan  . OSA (obstructive sleep apnea)    mild, intolerant of cpap  . PONV (postoperative nausea and vomiting)   .  Thyroid disease     Past Surgical History:  Procedure Laterality Date  . ABDOMINAL HYSTERECTOMY    . ABDOMINAL HYSTERECTOMY W/ PARTIAL VAGINACTOMY    . AORTIC VALVE REPLACEMENT N/A 10/12/2013   Procedure: AORTIC VALVE REPLACEMENT (AVR);  Surgeon: [name], MD;  Location: [place];  Service: Open Heart Surgery;  Laterality: N/A;  . Felt  . BARTHOLIN GLAND CYST EXCISION    . BLADDER SUSPENSION    . BREAST BIOPSY Bilateral 09/11/2000   neg  . BREAST BIOPSY Left 07/24/2010   neg  . BREAST CYST EXCISION  1988   bilateral nonmalignant tumors, x3  . CARDIAC CATHETERIZATION    .  CARDIAC CATHETERIZATION N/A 05/25/2016   Procedure: Coronary Balloon Angioplasty;  Surgeon: Leonie Man, MD;  Location: [place] [facility];  Service: Cardiovascular;  Laterality: N/A;  . CARDIAC CATHETERIZATION N/A 05/25/2016   Procedure: Coronary/Graft Angiography;  Surgeon: Leonie Man, MD;  Location: [place] [facility];  Service: Cardiovascular;  Laterality: N/A;  . CATARACT EXTRACTION W/ INTRAOCULAR LENS  IMPLANT, BILATERAL    . CHOLECYSTECTOMY  2001  . COLECTOMY     lap sigmoid  . COLONOSCOPY  2014   polyps found, 2 clamped off.  . CORONARY ANGIOPLASTY  10/29/2007   Prox RCA & Mid Cx.  . CORONARY ARTERY BYPASS GRAFT N/A 10/12/2013   Procedure: CORONARY ARTERY BYPASS GRAFT TIMES TWO;  Surgeon: [name], MD;  Location: [place];  Service: Open Heart Surgery;  Laterality: N/A;  . LEFT HEART CATHETERIZATION WITH CORONARY ANGIOGRAM N/A 10/09/2013   Procedure: LEFT HEART CATHETERIZATION WITH CORONARY ANGIOGRAM;  Surgeon: [name], MD;  Location: [facility];  Service: Cardiovascular;  Laterality: N/A;  . STERNAL WIRES REMOVAL N/A 04/13/2014   Procedure: STERNAL WIRES REMOVAL;  Surgeon: [name], MD;  Location: [facility];  Service: Thoracic;  Laterality: N/A;  . THYROIDECTOMY    . TONSILLECTOMY    . TUBAL LIGATION    . VAGINAL DELIVERY     3    Current Outpatient Prescriptions  Medication Sig Dispense Refill  . albuterol (PROAIR HFA) 108 (90 Base) MCG/ACT inhaler Inhale 2 puffs into the lungs every 4 (four) hours as needed for wheezing or shortness of breath. 1 Inhaler 2  . aspirin EC 81 MG tablet Take 81 mg by mouth daily.    . Calcium-Vitamin D (CALTRATE 600 PLUS-VIT D PO) Take 2 tablets by mouth 2 (two) times daily.     Marland Kitchen dimenhyDRINATE (DRAMAMINE) 50 MG tablet Take 50 mg by mouth every 6 (six) hours as needed.     Marland Kitchen esomeprazole (NEXIUM) 40 MG capsule Take 40 mg by mouth daily at 12 noon.     Marland Kitchen FLUoxetine (PROZAC) 40 MG capsule Take 40 mg by mouth 2 (two)  times daily.    . insulin NPH-regular Human (NOVOLIN 70/30) (70-30) 100 UNIT/ML injection Inject 70 Units into the skin 2 (two) times daily.    Marland Kitchen levothyroxine (SYNTHROID, LEVOTHROID) 150 MCG tablet Take 150 mcg by mouth daily before breakfast.    . nitroGLYCERIN (NITROSTAT) 0.4 MG SL tablet Place 0.4 mg under the tongue every 5 (five) minutes as needed for chest pain.    [name] DELICA LANCETS 99991111 MISC USE TO CHECK BLOOD SUGAR THREE TIMES DAILY    . prasugrel (EFFIENT) 10 MG TABS tablet Take 10 mg by mouth daily.    . traZODone (DESYREL) 100 MG tablet TAKE TWO TABLETS BY MOUTH  AT BEDTIME 60 tablet 0   No current facility-administered medications for this visit.     Allergies  Allergen Reactions  . Amitriptyline Other (See Comments)    Unknown reaction  . Benadryl [Diphenhydramine] Shortness Of Breath  . Demerol [Meperidine] Other (See Comments)    Unknown reaction  . Gabapentin Other (See Comments)    Unknown reaction  . Loratadine Other (See Comments)    Unknown reaction  . Meperidine Hcl Other (See Comments)    Unknown reaction  . Mirtazapine Other (See Comments)    Unknown reaction  . Olanzapine Other (See Comments)    Unknown reaction   . Voltaren [Diclofenac Sodium] Shortness Of Breath  . Zetia [Ezetimibe] Other (See Comments)    Weakness in legs, shakiness all over  . Ativan [Lorazepam] Other (See Comments)    Causes double vision at highter than .5 mg dose  . Atorvastatin Other (See Comments)    Muscle aches and weakness  . Budesonide-Formoterol Fumarate Other (See Comments)    Shakiness, tremors  . Bupropion Hcl Other (See Comments)    "cloud over me" depression  . Caffeine Other (See Comments)    jitters  . Codeine Sulfate Other (See Comments)    Makes chest hurt like a heart attack  . Lisinopril Cough  . Metformin Nausea And Vomiting  . Mometasone Furoate Nausea And Vomiting  . Morphine Sulfate Other (See Comments)    Chest pain like a heart attack  .  Other Other (See Comments)    Beta Blockers, reaction shortness of breath  . Oxycodone-Acetaminophen Nausea And Vomiting  . Pioglitazone Other (See Comments)    Cannot take because of risk of bladder cancer  . Propoxyphene N-Acetaminophen Nausea And Vomiting  . Rosuvastatin Other (See Comments)    Muscle aches and weakness  . Shellfish Allergy Diarrhea  . Tramadol Nausea Only  . Venlafaxine Other (See Comments)    Unknown reaction  . Zolpidem Tartrate Other (See Comments)     Jittery, diarrhea  . Latex Rash    Social History   Social History  . Marital status: Married    Spouse name: N/A  . Number of children: 3  . Years of education: N/A   Occupational History  . 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 Smoking status: Former Smoker    Packs/day: 0.50    Years: 30.00    Types: Cigarettes    Quit date: 10/02/2013  . Smokeless tobacco: Never Used  . Alcohol use No  . Drug use: No  . Sexual activity: Not on file   Other Topics Concern  . Not on file   Social History Narrative   Does not have Living Will   Desires CPR, would not want prolonged life support if futile.    Family History  Problem Relation Age of Onset  . Breast cancer Mother 10  . Hypertension Father   . Mesothelioma Father   . Asthma Father   . Stroke Paternal Grandfather   . Heart disease    . Breast cancer Maternal Aunt   . Breast cancer Paternal Aunt     Review of Systems:  As stated in the HPI and otherwise negative.   BP 110/68 (BP Location: Left Arm, Patient Position: Sitting, Cuff Size: Large)   Pulse 74   Ht 5\' 6"  (1.676 m)   Wt 196 lb (88.9 kg)   BMI 31.64 kg/m   Physical Examination: General: Well developed, well  nourished, NAD  HEENT: OP clear, mucus membranes moist  SKIN: warm, dry. No rashes. Neuro: No focal deficits  Musculoskeletal: Muscle strength 5/5 all ext  Psychiatric: Mood and affect normal  Neck: No JVD, no carotid bruits, no thyromegaly, no  lymphadenopathy.  Lungs:Clear bilaterally, no wheezes, rhonci, crackles Cardiovascular: Regular rate and rhythm. Systolic murmurs, gallops or rubs. Abdomen:Soft. Bowel sounds present. Non-tender.  Extremities: No lower extremity edema. Pulses are 2 + in the bilateral DP/PT.  Echo 05/25/16: Left ventricle: The cavity size was normal. There was moderate   concentric hypertrophy. Systolic function was normal. The   estimated ejection fraction was in the range of 60% to 65%. Wall   motion was normal; there were no regional wall motion   abnormalities. Doppler parameters are consistent with abnormal   left ventricular relaxation (grade 1 diastolic dysfunction).   Doppler parameters are consistent with high ventricular filling   pressure. - Aortic valve: A bioprosthesis was present. Transvalvular velocity   was within the normal range. There was no stenosis. Valve area   (VTI): 1.64 cm^2. Valve area (Vmax): 1.51 cm^2. Valve area   (Vmean): 1.77 cm^2. - Mitral valve: Transvalvular velocity was within the normal range.   There was no evidence for stenosis. There was no regurgitation. - Left atrium: The atrium was moderately dilated. - Right ventricle: The cavity size was normal. Wall thickness was   normal. Systolic function was normal. - Right atrium: The atrium was mildly dilated. - Tricuspid valve: There was mild regurgitation. - Pulmonary arteries: Systolic pressure was at the upper limits of   normal. PA peak pressure: 38 mm Hg (S).  Impressions:  - Bioprosthetic aortic valve gradients unchanged from 11/2013.  Cardiac cath 04/25/16: Diagnostic Diagram     Post-Intervention Diagram        EKG:  EKG is not ordered today. The ekg ordered today demonstrates   Recent Labs: 05/25/2016: TSH 1.873 05/26/2016: B Natriuretic Peptide 328.4 05/27/2016: Hemoglobin 10.3; Magnesium 1.9 06/07/2016: BUN 15; Creatinine, Ser 0.80; Platelets 210; Potassium 4.3; Sodium 138   Lipid Panel     Component Value Date/Time   CHOL 206 (H) 03/16/2015 0759   CHOL 211 (H) 10/03/2013 0515   TRIG 369 (H) 03/16/2015 0759   TRIG 272 (H) 10/03/2013 0515   HDL 21 (L) 03/16/2015 0759   HDL 22 (L) 10/03/2013 0515   CHOLHDL 9.8 (H) 03/16/2015 0759   VLDL 74 (H) 03/16/2015 0759   VLDL 54 (H) 10/03/2013 0515   LDLCALC 111 03/16/2015 0759   LDLCALC 135 (H) 10/03/2013 0515   LDLDIRECT 127.0 07/07/2014 0820     Wt Readings from Last 3 Encounters:  08/03/16 196 lb (88.9 kg)  07/02/16 196 lb 12.8 oz (89.3 kg)  06/28/16 196 lb (88.9 kg)     Other studies Reviewed: Additional studies/ records that were reviewed today include: . Review of the above records demonstrates:    Assessment and Plan:   1. CAD with stable angina: She is s/p CABG and recent PCI of the Circumflex restenotic stent lesion with angioplasty. She is overall stable with chronic chest wall pain and no exertional pains. Will continue ASA and Effient. She does not tolerate statins or beta blockers.   2. Aortic valve disease: s/p AVR. Normal bioprosthetic aortic valve function by echo December 2017. Continue SBE prophylaxis.   3. Hypertension: Controlled. No changes.   4. Hyperlipidemia: She is statin intolerant (Crestor, Lipitor, Pravastatin). She did not tolerate Zetia.  She was seen in lipid  clinic 07/13/14 and tried on low dose Pravastatin but did not tolerate due to muscle aches. She is now willing to try a PCSK9 inhibitor.   5. Dizziness: She has stable CAD with recent cath. She has normal LV systolic function by echo December 2017. She has stable aortic valve disease s/p replacement and no other valve disease. She has minimal carotid disease by dopplers 2015. She is known to have Mobitz 1 heart block but has seen EP (Dr. Caryl Comes) and no indication for pacemaker. She had normal heart response to exercise on stress test Feb 2018 so no chronotropic incompetence. I think she needs to have workup for other non-cardiac causes of  dizziness, perhaps head CT. She has daily headaches. She will call primary care. I will route this note to primary care.   Current medicines are reviewed at length with the patient today.  The patient does not have concerns regarding medicines.  The following changes have been made:  no change  Labs/ tests ordered today include:   No orders of the defined types were placed in this encounter.   Disposition:   FU with me in 6 months  Signed, [name], MD 08/03/2016 8:35 AM    [facility] [address], [place], [place]  [address] Phone: [phone]; Fax: [phone]

## 2016-08-03 NOTE — Patient Instructions (Signed)

## 2016-08-09 ENCOUNTER — Other Ambulatory Visit: Payer: Self-pay | Admitting: Internal Medicine

## 2016-08-09 DIAGNOSIS — R270 Ataxia, unspecified: Secondary | ICD-10-CM | POA: Insufficient documentation

## 2016-08-09 DIAGNOSIS — R260 Ataxic gait: Secondary | ICD-10-CM

## 2016-08-09 DIAGNOSIS — R42 Dizziness and giddiness: Secondary | ICD-10-CM | POA: Insufficient documentation

## 2016-08-09 DIAGNOSIS — R2689 Other abnormalities of gait and mobility: Secondary | ICD-10-CM

## 2016-08-16 ENCOUNTER — Inpatient Hospital Stay: Payer: Medicare Other

## 2016-08-16 ENCOUNTER — Inpatient Hospital Stay
Admission: EM | Admit: 2016-08-16 | Discharge: 2016-08-24 | DRG: 356 | Disposition: A | Discharge status: Home Health | Payer: Medicare Other | Attending: Internal Medicine | Admitting: Internal Medicine

## 2016-08-16 ENCOUNTER — Encounter
Admission: EM | Disposition: A | Discharge status: Home Health | Payer: Self-pay | Source: Home / Self Care | Attending: Internal Medicine

## 2016-08-16 ENCOUNTER — Encounter: Payer: Self-pay | Admitting: Emergency Medicine

## 2016-08-16 ENCOUNTER — Emergency Department: Payer: Medicare Other

## 2016-08-16 DIAGNOSIS — E119 Type 2 diabetes mellitus without complications: Secondary | ICD-10-CM | POA: Diagnosis not present

## 2016-08-16 DIAGNOSIS — J9601 Acute respiratory failure with hypoxia: Secondary | ICD-10-CM | POA: Diagnosis present

## 2016-08-16 DIAGNOSIS — E890 Postprocedural hypothyroidism: Secondary | ICD-10-CM | POA: Diagnosis present

## 2016-08-16 DIAGNOSIS — K5792 Diverticulitis of intestine, part unspecified, without perforation or abscess without bleeding: Secondary | ICD-10-CM | POA: Diagnosis present

## 2016-08-16 DIAGNOSIS — Z794 Long term (current) use of insulin: Secondary | ICD-10-CM | POA: Diagnosis not present

## 2016-08-16 DIAGNOSIS — F419 Anxiety disorder, unspecified: Secondary | ICD-10-CM | POA: Diagnosis present

## 2016-08-16 DIAGNOSIS — K922 Gastrointestinal hemorrhage, unspecified: Secondary | ICD-10-CM | POA: Diagnosis present

## 2016-08-16 DIAGNOSIS — A689 Relapsing fever, unspecified: Secondary | ICD-10-CM

## 2016-08-16 DIAGNOSIS — R509 Fever, unspecified: Secondary | ICD-10-CM

## 2016-08-16 DIAGNOSIS — R109 Unspecified abdominal pain: Secondary | ICD-10-CM

## 2016-08-16 DIAGNOSIS — Z9841 Cataract extraction status, right eye: Secondary | ICD-10-CM | POA: Diagnosis not present

## 2016-08-16 DIAGNOSIS — K5733 Diverticulitis of large intestine without perforation or abscess with bleeding: Principal | ICD-10-CM | POA: Diagnosis present

## 2016-08-16 DIAGNOSIS — Z7982 Long term (current) use of aspirin: Secondary | ICD-10-CM

## 2016-08-16 DIAGNOSIS — I2510 Atherosclerotic heart disease of native coronary artery without angina pectoris: Secondary | ICD-10-CM | POA: Diagnosis present

## 2016-08-16 DIAGNOSIS — I5033 Acute on chronic diastolic (congestive) heart failure: Secondary | ICD-10-CM | POA: Diagnosis present

## 2016-08-16 DIAGNOSIS — F329 Major depressive disorder, single episode, unspecified: Secondary | ICD-10-CM | POA: Diagnosis present

## 2016-08-16 DIAGNOSIS — Z961 Presence of intraocular lens: Secondary | ICD-10-CM | POA: Diagnosis present

## 2016-08-16 DIAGNOSIS — Z952 Presence of prosthetic heart valve: Secondary | ICD-10-CM | POA: Diagnosis not present

## 2016-08-16 DIAGNOSIS — K219 Gastro-esophageal reflux disease without esophagitis: Secondary | ICD-10-CM | POA: Diagnosis present

## 2016-08-16 DIAGNOSIS — Z79899 Other long term (current) drug therapy: Secondary | ICD-10-CM

## 2016-08-16 DIAGNOSIS — J449 Chronic obstructive pulmonary disease, unspecified: Secondary | ICD-10-CM | POA: Diagnosis present

## 2016-08-16 DIAGNOSIS — Z9842 Cataract extraction status, left eye: Secondary | ICD-10-CM | POA: Diagnosis not present

## 2016-08-16 DIAGNOSIS — E7800 Pure hypercholesterolemia, unspecified: Secondary | ICD-10-CM | POA: Diagnosis present

## 2016-08-16 DIAGNOSIS — E1165 Type 2 diabetes mellitus with hyperglycemia: Secondary | ICD-10-CM | POA: Diagnosis present

## 2016-08-16 DIAGNOSIS — Z9049 Acquired absence of other specified parts of digestive tract: Secondary | ICD-10-CM | POA: Diagnosis not present

## 2016-08-16 DIAGNOSIS — Z888 Allergy status to other drugs, medicaments and biological substances status: Secondary | ICD-10-CM

## 2016-08-16 DIAGNOSIS — H9190 Unspecified hearing loss, unspecified ear: Secondary | ICD-10-CM | POA: Diagnosis present

## 2016-08-16 DIAGNOSIS — R110 Nausea: Secondary | ICD-10-CM

## 2016-08-16 DIAGNOSIS — E871 Hypo-osmolality and hyponatremia: Secondary | ICD-10-CM | POA: Diagnosis not present

## 2016-08-16 DIAGNOSIS — I5031 Acute diastolic (congestive) heart failure: Secondary | ICD-10-CM

## 2016-08-16 DIAGNOSIS — Z951 Presence of aortocoronary bypass graft: Secondary | ICD-10-CM | POA: Diagnosis not present

## 2016-08-16 DIAGNOSIS — Z86010 Personal history of colonic polyps: Secondary | ICD-10-CM

## 2016-08-16 DIAGNOSIS — Z87891 Personal history of nicotine dependence: Secondary | ICD-10-CM

## 2016-08-16 DIAGNOSIS — R197 Diarrhea, unspecified: Secondary | ICD-10-CM

## 2016-08-16 DIAGNOSIS — D72829 Elevated white blood cell count, unspecified: Secondary | ICD-10-CM

## 2016-08-16 DIAGNOSIS — D62 Acute posthemorrhagic anemia: Secondary | ICD-10-CM | POA: Diagnosis present

## 2016-08-16 DIAGNOSIS — G4733 Obstructive sleep apnea (adult) (pediatric): Secondary | ICD-10-CM | POA: Diagnosis present

## 2016-08-16 DIAGNOSIS — R531 Weakness: Secondary | ICD-10-CM

## 2016-08-16 HISTORY — PX: VISCERAL ARTERY INTERVENTION: CATH118277

## 2016-08-16 LAB — CBC
HCT: 34.6 % — ABNORMAL LOW (ref 35.0–47.0)
HCT: 29.5 % — ABNORMAL LOW (ref 35.0–47.0)
Hemoglobin: 11.4 g/dL — ABNORMAL LOW (ref 12.0–16.0)
Hemoglobin: 9.7 g/dL — ABNORMAL LOW (ref 12.0–16.0)
MCH: 26.3 pg (ref 26.0–34.0)
MCH: 26.3 pg (ref 26.0–34.0)
MCHC: 32.9 g/dL (ref 32.0–36.0)
MCHC: 33 g/dL (ref 32.0–36.0)
MCV: 79.9 fL — ABNORMAL LOW (ref 80.0–100.0)
MCV: 79.8 fL — AB (ref 80.0–100.0)
PLATELETS: 185 10*3/uL (ref 150–440)
Platelets: 160 10*3/uL (ref 150–440)
RBC: 4.33 MIL/uL (ref 3.80–5.20)
RBC: 3.69 MIL/uL — ABNORMAL LOW (ref 3.80–5.20)
RDW: 15.5 % — ABNORMAL HIGH (ref 11.5–14.5)
RDW: 15.8 % — AB (ref 11.5–14.5)
WBC: 15.6 10*3/uL — ABNORMAL HIGH (ref 3.6–11.0)
WBC: 12.9 10*3/uL — AB (ref 3.6–11.0)

## 2016-08-16 LAB — ABO/RH: ABO/RH(D): B POS

## 2016-08-16 LAB — COMPREHENSIVE METABOLIC PANEL
ALT: 13 U/L — AB (ref 14–54)
AST: 17 U/L (ref 15–41)
Albumin: 3.2 g/dL — ABNORMAL LOW (ref 3.5–5.0)
Alkaline Phosphatase: 83 U/L (ref 38–126)
Anion gap: 8 (ref 5–15)
BUN: 18 mg/dL (ref 6–20)
CHLORIDE: 102 mmol/L (ref 101–111)
CO2: 25 mmol/L (ref 22–32)
CREATININE: 0.8 mg/dL (ref 0.44–1.00)
Calcium: 8.2 mg/dL — ABNORMAL LOW (ref 8.9–10.3)
GFR calc non Af Amer: >60 mL/min (ref >60)
Glucose, Bld: 243 mg/dL — ABNORMAL HIGH (ref 65–99)
Potassium: 4.2 mmol/L (ref 3.5–5.1)
SODIUM: 135 mmol/L (ref 135–145)
Total Bilirubin: 0.5 mg/dL (ref 0.3–1.2)
Total Protein: 6.6 g/dL (ref 6.5–8.1)

## 2016-08-16 LAB — HEMOGLOBIN AND HEMATOCRIT, BLOOD
HCT: 29.5 % — ABNORMAL LOW (ref 35.0–47.0)
HEMOGLOBIN: 9.8 g/dL — AB (ref 12.0–16.0)

## 2016-08-16 LAB — GLUCOSE, CAPILLARY
GLUCOSE-CAPILLARY: 355 mg/dL — AB (ref 65–99)
Glucose-Capillary: 178 mg/dL — ABNORMAL HIGH (ref 65–99)
Glucose-Capillary: 324 mg/dL — ABNORMAL HIGH (ref 65–99)

## 2016-08-16 LAB — PREPARE RBC (CROSSMATCH)

## 2016-08-16 LAB — PROTIME-INR
INR: 1.13
Prothrombin Time: 14.6 seconds (ref 11.4–15.2)

## 2016-08-16 LAB — MRSA PCR SCREENING: MRSA by PCR: NEGATIVE

## 2016-08-16 LAB — HEMOGLOBIN: Hemoglobin: 7.2 g/dL — ABNORMAL LOW (ref 12.0–16.0)

## 2016-08-16 SURGERY — VISCERAL ARTERY INTERVENTION
Anesthesia: Moderate Sedation

## 2016-08-16 SURGERY — VISCERAL ANGIOGRAPHY
Anesthesia: Moderate Sedation

## 2016-08-16 MED ORDER — ONDANSETRON HCL 4 MG PO TABS
4.0000 mg | ORAL_TABLET | Freq: Four times a day (QID) | ORAL | Status: DC | PRN
  Administered 2016-08-24: 4 mg via ORAL
  Filled 2016-08-16: qty 1

## 2016-08-16 MED ORDER — PROMETHAZINE HCL 25 MG/ML IJ SOLN
12.5000 mg | Freq: Four times a day (QID) | INTRAMUSCULAR | Status: DC | PRN
  Administered 2016-08-16 – 2016-08-18 (×3): 12.5 mg via INTRAVENOUS
  Filled 2016-08-16 (×3): qty 1

## 2016-08-16 MED ORDER — SODIUM CHLORIDE 0.9 % IV SOLN
Freq: Once | INTRAVENOUS | Status: AC
  Administered 2016-08-16: 16:00:00 via INTRAVENOUS

## 2016-08-16 MED ORDER — FENTANYL CITRATE (PF) 100 MCG/2ML IJ SOLN
INTRAMUSCULAR | Status: DC | PRN
  Administered 2016-08-16 (×2): 50 ug via INTRAVENOUS
  Administered 2016-08-16 (×2): 25 ug via INTRAVENOUS

## 2016-08-16 MED ORDER — ONDANSETRON HCL 4 MG/2ML IJ SOLN
4.0000 mg | Freq: Once | INTRAMUSCULAR | Status: AC
  Administered 2016-08-16: 4 mg via INTRAVENOUS

## 2016-08-16 MED ORDER — PANTOPRAZOLE SODIUM 40 MG PO TBEC
40.0000 mg | DELAYED_RELEASE_TABLET | Freq: Every day | ORAL | Status: DC
  Administered 2016-08-16 – 2016-08-24 (×9): 40 mg via ORAL
  Filled 2016-08-16 (×10): qty 1

## 2016-08-16 MED ORDER — INSULIN ASPART 100 UNIT/ML ~~LOC~~ SOLN
0.0000 [IU] | Freq: Three times a day (TID) | SUBCUTANEOUS | Status: DC
  Administered 2016-08-16 – 2016-08-17 (×2): 9 [IU] via SUBCUTANEOUS
  Filled 2016-08-16 (×2): qty 9

## 2016-08-16 MED ORDER — ACETAMINOPHEN 650 MG RE SUPP: 650.0000 mg | Freq: Four times a day (QID) | RECTAL | Status: DC | PRN

## 2016-08-16 MED ORDER — SODIUM CHLORIDE 0.9 % IV SOLN
Freq: Once | INTRAVENOUS | Status: AC
  Administered 2016-08-16: 17:00:00 via INTRAVENOUS

## 2016-08-16 MED ORDER — TECHNETIUM TC 99M-LABELED RED BLOOD CELLS IV KIT
20.0000 | PACK | Freq: Once | INTRAVENOUS | Status: AC | PRN
  Administered 2016-08-16: 23.609 via INTRAVENOUS

## 2016-08-16 MED ORDER — FENTANYL CITRATE (PF) 100 MCG/2ML IJ SOLN
12.5000 ug | Freq: Once | INTRAMUSCULAR | Status: AC
  Administered 2016-08-16: 12.5 ug via INTRAVENOUS
  Filled 2016-08-16: qty 2

## 2016-08-16 MED ORDER — TRAMADOL HCL 50 MG PO TABS
50.0000 mg | ORAL_TABLET | Freq: Four times a day (QID) | ORAL | Status: DC | PRN
  Administered 2016-08-22: 50 mg via ORAL
  Filled 2016-08-16 (×2): qty 1

## 2016-08-16 MED ORDER — HEPARIN SODIUM (PORCINE) 1000 UNIT/ML IJ SOLN
INTRAMUSCULAR | Status: AC
  Filled 2016-08-16: qty 1

## 2016-08-16 MED ORDER — PIPERACILLIN-TAZOBACTAM 3.375 G IVPB 30 MIN
3.3750 g | Freq: Once | INTRAVENOUS | Status: AC
  Administered 2016-08-16: 3.375 g via INTRAVENOUS
  Filled 2016-08-16: qty 50

## 2016-08-16 MED ORDER — HYDROMORPHONE HCL 1 MG/ML IJ SOLN
0.5000 mg | INTRAMUSCULAR | Status: DC | PRN
  Administered 2016-08-16 (×2): 0.5 mg via INTRAVENOUS
  Administered 2016-08-17 (×2): 1 mg via INTRAVENOUS
  Filled 2016-08-16: qty 1
  Filled 2016-08-16 (×2): qty 0.5
  Filled 2016-08-16: qty 1

## 2016-08-16 MED ORDER — IOPAMIDOL (ISOVUE-300) INJECTION 61%
INTRAVENOUS | Status: DC | PRN
  Administered 2016-08-16: 65 mL via INTRA_ARTERIAL

## 2016-08-16 MED ORDER — LEVOTHYROXINE SODIUM 150 MCG PO TABS
150.0000 ug | ORAL_TABLET | Freq: Once | ORAL | Status: AC
  Administered 2016-08-16: 150 ug via ORAL
  Filled 2016-08-16: qty 1

## 2016-08-16 MED ORDER — FENTANYL CITRATE (PF) 100 MCG/2ML IJ SOLN
INTRAMUSCULAR | Status: AC
  Filled 2016-08-16: qty 2

## 2016-08-16 MED ORDER — NITROGLYCERIN 0.4 MG SL SUBL: 0.4000 mg | SUBLINGUAL_TABLET | SUBLINGUAL | Status: DC | PRN

## 2016-08-16 MED ORDER — HEPARIN (PORCINE) IN NACL 2-0.9 UNIT/ML-% IJ SOLN
INTRAMUSCULAR | Status: AC
  Filled 2016-08-16: qty 500

## 2016-08-16 MED ORDER — MIDAZOLAM HCL 2 MG/2ML IJ SOLN
INTRAMUSCULAR | Status: AC
  Filled 2016-08-16: qty 2

## 2016-08-16 MED ORDER — SODIUM CHLORIDE 0.9 % IV SOLN
INTRAVENOUS | Status: AC
  Administered 2016-08-16: 12:00:00 via INTRAVENOUS

## 2016-08-16 MED ORDER — FLUOXETINE HCL 20 MG PO CAPS
40.0000 mg | ORAL_CAPSULE | Freq: Two times a day (BID) | ORAL | Status: DC
  Administered 2016-08-16 – 2016-08-24 (×17): 40 mg via ORAL
  Filled 2016-08-16 (×19): qty 2

## 2016-08-16 MED ORDER — LEVOFLOXACIN IN D5W 750 MG/150ML IV SOLN
750.0000 mg | Freq: Once | INTRAVENOUS | Status: AC
  Administered 2016-08-16: 750 mg via INTRAVENOUS
  Filled 2016-08-16 (×2): qty 150

## 2016-08-16 MED ORDER — METHYLPREDNISOLONE SODIUM SUCC 125 MG IJ SOLR
INTRAMUSCULAR | Status: AC
  Filled 2016-08-16: qty 2

## 2016-08-16 MED ORDER — ALBUTEROL SULFATE (2.5 MG/3ML) 0.083% IN NEBU
2.5000 mg | INHALATION_SOLUTION | RESPIRATORY_TRACT | Status: DC | PRN
  Administered 2016-08-20 – 2016-08-23 (×4): 2.5 mg via RESPIRATORY_TRACT
  Filled 2016-08-16 (×5): qty 3

## 2016-08-16 MED ORDER — METHYLPREDNISOLONE SODIUM SUCC 125 MG IJ SOLR
125.0000 mg | Freq: Once | INTRAMUSCULAR | Status: AC
  Administered 2016-08-16: 125 mg via INTRAVENOUS

## 2016-08-16 MED ORDER — ONDANSETRON HCL 4 MG/2ML IJ SOLN
4.0000 mg | Freq: Four times a day (QID) | INTRAMUSCULAR | Status: DC | PRN
  Administered 2016-08-16 – 2016-08-21 (×6): 4 mg via INTRAVENOUS
  Filled 2016-08-16 (×6): qty 2

## 2016-08-16 MED ORDER — LEVOFLOXACIN IN D5W 750 MG/150ML IV SOLN
750.0000 mg | INTRAVENOUS | Status: DC
  Administered 2016-08-17 – 2016-08-19 (×3): 750 mg via INTRAVENOUS
  Filled 2016-08-16 (×5): qty 150

## 2016-08-16 MED ORDER — SODIUM CHLORIDE 0.9 % IV SOLN
Freq: Once | INTRAVENOUS | Status: AC
  Administered 2016-08-17: 01:00:00 via INTRAVENOUS

## 2016-08-16 MED ORDER — MIDAZOLAM HCL 2 MG/2ML IJ SOLN
INTRAMUSCULAR | Status: DC | PRN
  Administered 2016-08-16: 1 mg via INTRAVENOUS
  Administered 2016-08-16: 2 mg via INTRAVENOUS
  Administered 2016-08-16: 1 mg via INTRAVENOUS

## 2016-08-16 MED ORDER — CEFAZOLIN IN D5W 1 GM/50ML IV SOLN
1.0000 g | Freq: Once | INTRAVENOUS | Status: AC
  Administered 2016-08-16: 1 g via INTRAVENOUS

## 2016-08-16 MED ORDER — IOPAMIDOL (ISOVUE-300) INJECTION 61%
100.0000 mL | Freq: Once | INTRAVENOUS | Status: AC | PRN
  Administered 2016-08-16: 100 mL via INTRAVENOUS

## 2016-08-16 MED ORDER — ACETAMINOPHEN 325 MG PO TABS
650.0000 mg | ORAL_TABLET | Freq: Four times a day (QID) | ORAL | Status: DC | PRN
  Administered 2016-08-19 – 2016-08-24 (×5): 650 mg via ORAL
  Filled 2016-08-16 (×5): qty 2

## 2016-08-16 MED ORDER — ONDANSETRON HCL 4 MG/2ML IJ SOLN
INTRAMUSCULAR | Status: AC
  Administered 2016-08-16: 4 mg via INTRAVENOUS
  Filled 2016-08-16: qty 2

## 2016-08-16 MED ORDER — SODIUM CHLORIDE 0.9 % IV SOLN
INTRAVENOUS | Status: DC
  Administered 2016-08-16: 18:00:00 via INTRAVENOUS

## 2016-08-16 MED ORDER — LIDOCAINE-EPINEPHRINE (PF) 2 %-1:200000 IJ SOLN
INTRAMUSCULAR | Status: AC
  Filled 2016-08-16: qty 20

## 2016-08-16 MED ORDER — LEVOTHYROXINE SODIUM 75 MCG PO TABS
150.0000 ug | ORAL_TABLET | Freq: Every day | ORAL | Status: DC
  Administered 2016-08-17 – 2016-08-24 (×8): 150 ug via ORAL
  Filled 2016-08-16 (×9): qty 2

## 2016-08-16 MED ORDER — SODIUM CHLORIDE 0.9 % IV BOLUS (SEPSIS)
1000.0000 mL | Freq: Once | INTRAVENOUS | Status: AC
  Administered 2016-08-16: 1000 mL via INTRAVENOUS

## 2016-08-16 MED ORDER — ONDANSETRON HCL 4 MG/2ML IJ SOLN
INTRAMUSCULAR | Status: AC
  Filled 2016-08-16: qty 2

## 2016-08-16 MED ORDER — HYDROMORPHONE HCL 1 MG/ML IJ SOLN
0.5000 mg | INTRAMUSCULAR | Status: DC | PRN
  Filled 2016-08-16: qty 1

## 2016-08-16 MED ORDER — TRAZODONE HCL 100 MG PO TABS
200.0000 mg | ORAL_TABLET | Freq: Every day | ORAL | Status: DC
  Administered 2016-08-16 – 2016-08-23 (×8): 200 mg via ORAL
  Filled 2016-08-16 (×3): qty 2
  Filled 2016-08-16 (×2): qty 4
  Filled 2016-08-16 (×3): qty 2

## 2016-08-16 MED ORDER — ALBUTEROL SULFATE HFA 108 (90 BASE) MCG/ACT IN AERS: 2.0000 | INHALATION_SPRAY | RESPIRATORY_TRACT | Status: DC | PRN

## 2016-08-16 MED ORDER — HEPARIN (PORCINE) IN NACL 2-0.9 UNIT/ML-% IJ SOLN
INTRAMUSCULAR | Status: AC
  Filled 2016-08-16: qty 1000

## 2016-08-16 MED ORDER — METRONIDAZOLE IN NACL 5-0.79 MG/ML-% IV SOLN
500.0000 mg | Freq: Three times a day (TID) | INTRAVENOUS | Status: DC
  Administered 2016-08-16 – 2016-08-20 (×12): 500 mg via INTRAVENOUS
  Filled 2016-08-16 (×17): qty 100

## 2016-08-16 MED ORDER — IOPAMIDOL (ISOVUE-300) INJECTION 61%
30.0000 mL | Freq: Once | INTRAVENOUS | Status: AC | PRN
  Administered 2016-08-16: 30 mL via ORAL

## 2016-08-16 SURGICAL SUPPLY — 13 items
BLOCK BEAD 500-700 (Vascular Products) ×2 IMPLANT
CATH 5FR REUT (CATHETERS) ×2 IMPLANT
CATH C2 65CM (CATHETERS) ×2 IMPLANT
CATH MICROCATH PRGRT 2.8F 110 (CATHETERS) IMPLANT
CATH PIG 70CM (CATHETERS) ×2 IMPLANT
CATH RIM 65CM (CATHETERS) ×2 IMPLANT
DEVICE PRESTO INFLATION (MISCELLANEOUS) ×3 IMPLANT
DEVICE STARCLOSE SE CLOSURE (Vascular Products) ×2 IMPLANT
GLIDEWIRE STIFF .35X180X3 HYDR (WIRE) ×2 IMPLANT
MICROCATH PROGREAT 2.8F 110 CM (CATHETERS) ×3
PACK ANGIOGRAPHY (CUSTOM PROCEDURE TRAY) ×2 IMPLANT
SHEATH BRITE TIP 5FRX11 (SHEATH) ×2 IMPLANT
WIRE J 3MM .035X145CM (WIRE) ×2 IMPLANT

## 2016-08-16 NOTE — ED Notes (Signed)
Patient ambulated to commode without difficulty. Bright red blood noted.

## 2016-08-16 NOTE — ED Provider Notes (Signed)
 -----------------------------------------   9:04 AM on 08/16/2016 -----------------------------------------  CT reviewed, c/w diverticulitis which is recurrent for her.  VSS.  Case d/w hospitalist for further management.  Will start Zosyn.   Carrie Mew, MD 08/16/16 [phone]

## 2016-08-16 NOTE — Consult Note (Signed)
[facility] Vascular Consult Note  MRN : [id number]  [name] is a 71 y.o. (07/14/1945) female who presents with chief complaint of  Chief Complaint  Patient presents with  . GI Bleeding  .  History of Present Illness: I am asked to see the patient by Dr. Margaretmary Eddy for evaluation for embolization for the treatment of the risks GI bleeding. The patient has had nearly continuous GI bleeding for the past 8 hours. This has been associated with crampy abdominal pain. She reports this starting in about 3 AM with no provoking symptoms. She does not have fever or chills. This is associated with malaise and nausea. She has a extremely long list of medical comorbidities and has had previous diverticulitis of the colon.   Current Facility-Administered Medications  Medication Dose Route Frequency Provider Last Rate Last Dose  . 0.9 %  sodium chloride infusion   Intravenous Continuous Nicholes Mango, MD 100 mL/hr at 08/16/16 1138    . 0.9 %  sodium chloride infusion   Intravenous Once Nicholes Mango, MD      . acetaminophen (TYLENOL) tablet 650 mg  650 mg Oral Q6H PRN Nicholes Mango, MD       Or  . acetaminophen (TYLENOL) suppository 650 mg  650 mg Rectal Q6H PRN Aruna Gouru, MD      . albuterol (PROVENTIL) (2.5 MG/3ML) 0.083% nebulizer solution 2.5 mg  2.5 mg Nebulization Q4H PRN Nicholes Mango, MD      . FLUoxetine (PROZAC) capsule 40 mg  40 mg Oral BID Nicholes Mango, MD   40 mg at 08/16/16 1145  . HYDROmorphone (DILAUDID) injection 0.5 mg  0.5 mg Intravenous Q4H PRN Nicholes Mango, MD      . HYDROmorphone (DILAUDID) injection 0.5-1 mg  0.5-1 mg Intravenous Q4H PRN Nicholes Mango, MD      . insulin aspart (novoLOG) injection 0-9 Units  0-9 Units Subcutaneous TID WC Nicholes Mango, MD      . Derrill Memo ON 08/17/2016] levofloxacin (LEVAQUIN) IVPB 750 mg  750 mg Intravenous Q24H Nicholes Mango, MD      . Derrill Memo ON 08/17/2016] levothyroxine (SYNTHROID, LEVOTHROID) tablet 150 mcg  150 mcg Oral QAC breakfast Nicholes Mango, MD      . metroNIDAZOLE (FLAGYL) IVPB 500 mg  500 mg Intravenous Q8H Aruna Gouru, MD   500 mg at 08/16/16 1146  . nitroGLYCERIN (NITROSTAT) SL tablet 0.4 mg  0.4 mg Sublingual Q5 min PRN Nicholes Mango, MD      . ondansetron (ZOFRAN) tablet 4 mg  4 mg Oral Q6H PRN Nicholes Mango, MD       Or  . ondansetron (ZOFRAN) injection 4 mg  4 mg Intravenous Q6H PRN Nicholes Mango, MD   4 mg at 08/16/16 1145  . pantoprazole (PROTONIX) EC tablet 40 mg  40 mg Oral Daily Nicholes Mango, MD   40 mg at 08/16/16 1145  . promethazine (PHENERGAN) injection 12.5 mg  12.5 mg Intravenous Q6H PRN Nicholes Mango, MD   12.5 mg at 08/16/16 1400  . technetium labeled red blood cells (ULTRATAG) injection kit 20 millicurie  20 millicurie Intravenous Once PRN David A Martinique, MD      . traMADol Veatrice Bourbon) tablet 50 mg  50 mg Oral Q6H PRN Nicholes Mango, MD      . traZODone (DESYREL) tablet 200 mg  200 mg Oral QHS Nicholes Mango, MD        Past Medical History:  Diagnosis Date  . ACE-inhibitor cough   .  Allergic rhinitis   . Anemia    iron deficiency anemia  . Anxiety   . Arthritis   . Asthma   . Cataract   . Chronic depression   . Chronic headache   . COPD (chronic obstructive pulmonary disease) ([place])   . Coronary artery disease    a. s/p CABG x2 (LIMA-LAD and SVG-RCA)  b. NST neg for ischemia 07/24/14  . Diabetes mellitus    type 2  . Diverticulitis of colon   . GERD (gastroesophageal reflux disease)   . H/O aortic valve replacement   . Hearing loss   . History of blood transfusion 2013  . HOH (hard of hearing)   . Hypercholesterolemia    intolerance of statins and niaspan  . OSA (obstructive sleep apnea)    mild, intolerant of cpap  . PONV (postoperative nausea and vomiting)   . Thyroid disease     Past Surgical History:  Procedure Laterality Date  . ABDOMINAL HYSTERECTOMY    . ABDOMINAL HYSTERECTOMY W/ PARTIAL VAGINACTOMY    . AORTIC VALVE REPLACEMENT N/A 10/12/2013   Procedure: AORTIC VALVE REPLACEMENT (AVR);   Surgeon: [name], MD;  Location: [facility];  Service: Open Heart Surgery;  Laterality: N/A;  . [place]  . BARTHOLIN GLAND CYST EXCISION    . BLADDER SUSPENSION    . BREAST BIOPSY Bilateral 09/11/2000   neg  . BREAST BIOPSY Left 07/24/2010   neg  . BREAST CYST EXCISION  1988   bilateral nonmalignant tumors, x3  . CARDIAC CATHETERIZATION    . CARDIAC CATHETERIZATION N/A 05/25/2016   Procedure: Coronary Balloon Angioplasty;  Surgeon: Leonie Man, MD;  Location: [facility];  Service: Cardiovascular;  Laterality: N/A;  . CARDIAC CATHETERIZATION N/A 05/25/2016   Procedure: Coronary/Graft Angiography;  Surgeon: Leonie Man, MD;  Location: [facility];  Service: Cardiovascular;  Laterality: N/A;  . CATARACT EXTRACTION W/ INTRAOCULAR LENS  IMPLANT, BILATERAL    . CHOLECYSTECTOMY  2001  . COLECTOMY     lap sigmoid  . COLONOSCOPY  2014   polyps found, 2 clamped off.  . CORONARY ANGIOPLASTY  10/29/2007   Prox RCA & Mid Cx.  . CORONARY ARTERY BYPASS GRAFT N/A 10/12/2013   Procedure: CORONARY ARTERY BYPASS GRAFT TIMES TWO;  Surgeon: [name], MD;  Location: [facility];  Service: Open Heart Surgery;  Laterality: N/A;  . LEFT HEART CATHETERIZATION WITH CORONARY ANGIOGRAM N/A 10/09/2013   Procedure: LEFT HEART CATHETERIZATION WITH CORONARY ANGIOGRAM;  Surgeon: [name], MD;  Location: [facility];  Service: Cardiovascular;  Laterality: N/A;  . STERNAL WIRES REMOVAL N/A 04/13/2014   Procedure: STERNAL WIRES REMOVAL;  Surgeon: [name], MD;  Location: [facility];  Service: Thoracic;  Laterality: N/A;  . THYROIDECTOMY    . TONSILLECTOMY    . TUBAL LIGATION    . VAGINAL DELIVERY     3    Social History Social History  Substance Use Topics  . Smoking status: Former Smoker    Packs/day: 0.50    Years: 30.00    Types: Cigarettes    Quit date: 10/02/2013  . Smokeless tobacco: Never Used  . Alcohol use No  Married.  Husband accompanies today  Family  History Family History  Problem Relation Age of Onset  . Breast cancer Mother 3  . Hypertension Father   . Mesothelioma Father   . Asthma Father   . Stroke Paternal Grandfather   . Heart  disease    . Breast cancer Maternal Aunt   . Breast cancer Paternal Aunt     Allergies  Allergen Reactions  . Amitriptyline Other (See Comments)    Unknown reaction  . Benadryl [Diphenhydramine] Shortness Of Breath  . Demerol [Meperidine] Other (See Comments)    Unknown reaction  . Gabapentin Other (See Comments)    Unknown reaction  . Loratadine Other (See Comments)    Unknown reaction  . Meperidine Hcl Other (See Comments)    Unknown reaction  . Mirtazapine Other (See Comments)    Unknown reaction  . Olanzapine Other (See Comments)    Unknown reaction   . Voltaren [Diclofenac Sodium] Shortness Of Breath  . Zetia [Ezetimibe] Other (See Comments)    Weakness in legs, shakiness all over  . Ativan [Lorazepam] Other (See Comments)    Causes double vision at highter than .5 mg dose  . Atorvastatin Other (See Comments)    Muscle aches and weakness  . Budesonide-Formoterol Fumarate Other (See Comments)    Shakiness, tremors  . Bupropion Hcl Other (See Comments)    "cloud over me" depression  . Caffeine Other (See Comments)    jitters  . Codeine Sulfate Other (See Comments)    Makes chest hurt like a heart attack  . Lisinopril Cough  . Metformin Nausea And Vomiting  . Mometasone Furoate Nausea And Vomiting  . Morphine Sulfate Other (See Comments)    Chest pain like a heart attack  . Other Other (See Comments)    Beta Blockers, reaction shortness of breath  . Oxycodone-Acetaminophen Nausea And Vomiting  . Pioglitazone Other (See Comments)    Cannot take because of risk of bladder cancer  . Propoxyphene N-Acetaminophen Nausea And Vomiting  . Rosuvastatin Other (See Comments)    Muscle aches and weakness  . Shellfish Allergy Diarrhea  . Tramadol Nausea Only  . Venlafaxine Other  (See Comments)    Unknown reaction  . Zolpidem Tartrate Other (See Comments)     Jittery, diarrhea  . Latex Rash     REVIEW OF SYSTEMS (Negative unless checked)  Constitutional: '[]' Weight loss  '[]' Fever  '[]' Chills Cardiac: '[]' Chest pain   '[]' Chest pressure   '[x]' Palpitations   '[]' Shortness of breath when laying flat   '[]' Shortness of breath at rest   '[x]' Shortness of breath with exertion. Vascular:  '[]' Pain in legs with walking   '[]' Pain in legs at rest   '[]' Pain in legs when laying flat   '[]' Claudication   '[]' Pain in feet when walking  '[]' Pain in feet at rest  '[]' Pain in feet when laying flat   '[]' History of DVT   '[]' Phlebitis   '[]' Swelling in legs   '[]' Varicose veins   '[]' Non-healing ulcers Pulmonary:   '[]' Uses home oxygen   '[]' Productive cough   '[]' Hemoptysis   '[]' Wheeze  '[]' COPD   '[]' Asthma Neurologic:  '[]' Dizziness  '[]' Blackouts   '[]' Seizures   '[]' History of stroke   '[]' History of TIA  '[]' Aphasia   '[]' Temporary blindness   '[]' Dysphagia   '[]' Weakness or numbness in arms   '[]' Weakness or numbness in legs Musculoskeletal:  '[x]' Arthritis   '[]' Joint swelling   '[]' Joint pain   '[]' Low back pain Hematologic:  '[]' Easy bruising  '[]' Easy bleeding   '[]' Hypercoagulable state   '[]' Anemic  '[]' Hepatitis Gastrointestinal:  '[x]' Blood in stool   '[]' Vomiting blood  '[]' Gastroesophageal reflux/heartburn   '[]' Difficulty swallowing. Genitourinary:  '[]' Chronic kidney disease   '[]' Difficult urination  '[]' Frequent urination  '[]' Burning with urination   '[]' Blood in urine Skin:  '[]' Rashes   '[]'   Ulcers   '[]' Wounds Psychological:  '[]' History of anxiety   '[]'  History of major depression.  Physical Examination  Vitals:   08/16/16 1000 08/16/16 1300 08/16/16 1616 08/16/16 1653  BP: 122/65 114/64 (!) 102/54 (!) 105/54  Pulse: 69 72 62 86  Resp: (!) '24 19 16 14  ' Temp:  98 F (36.7 C) 99.7 F (37.6 C) 99 F (37.2 C)  TempSrc:  Oral Oral Oral  SpO2: 93% 94% 97% 98%  Weight:      Height:       Body mass index is 31.15 kg/m. Gen:  WD/WN, pale, in obvious  discomfort Head: /AT, No temporalis wasting.  Ear/Nose/Throat: Hearing grossly intact, nares w/o erythema or drainage, oropharynx w/o Erythema/Exudate Eyes: Sclera non-icteric, conjunctiva clear Neck: Trachea midline.  No JVD.  Pulmonary:  Good air movement, respirations not labored, equal bilaterally.  Cardiac: RRR, normal S1, S2. Vascular:  Vessel Right Left  Radial Palpable Palpable                                   Gastrointestinal: soft, diffusely tender, no rebound or guarding Musculoskeletal: M/S 5/5 throughout.  Extremities without ischemic changes.  No deformity or atrophy Neurologic: Sensation grossly intact in extremities.  Symmetrical.  Speech is fluent. Motor exam as listed above. Psychiatric: Judgment intact, Mood & affect appropriate for pt's clinical situation. Dermatologic: No rashes or ulcers noted.  No cellulitis or open wounds. Lymph : No Cervical, Axillary, or Inguinal lymphadenopathy.      CBC Lab Results  Component Value Date   WBC 12.9 (H) 08/16/2016   HGB 7.2 (L) 08/16/2016   HCT 29.5 (L) 08/16/2016   MCV 79.8 (L) 08/16/2016   PLT 160 08/16/2016    BMET    Component Value Date/Time   NA 135 08/16/2016 0632   NA 138 06/07/2016 0957   NA 137 12/22/2013 1544   K 4.2 08/16/2016 0632   K 4.0 12/22/2013 1544   CL 102 08/16/2016 0632   CL 106 12/22/2013 1544   CO2 25 08/16/2016 0632   CO2 24 12/22/2013 1544   GLUCOSE 243 (H) 08/16/2016 0632   GLUCOSE 184 (H) 12/22/2013 1544   BUN 18 08/16/2016 0632   BUN 15 06/07/2016 0957   BUN 15 12/22/2013 1544   CREATININE 0.80 08/16/2016 0632   CREATININE 0.90 12/22/2013 1544   CREATININE 0.67 08/30/2011 1120   CALCIUM 8.2 (L) 08/16/2016 0632   CALCIUM 7.9 (L) 12/22/2013 1544   GFRNONAA >60 08/16/2016 0632   GFRNONAA >60 12/22/2013 1544   GFRAA >60 08/16/2016 0632   GFRAA >60 12/22/2013 1544   Estimated Creatinine Clearance: 71.9 mL/min (by C-G formula based on SCr of 0.8 mg/dL).  COAG Lab  Results  Component Value Date   INR 1.13 08/16/2016   INR 1.12 05/25/2016   INR 1.13 07/24/2014    Radiology Ct Abdomen Pelvis W Contrast  Result Date: 08/16/2016 CLINICAL DATA:  Left lower quadrant pain. Rectal bleeding. History of diverticulitis. EXAM: CT ABDOMEN AND PELVIS WITH CONTRAST TECHNIQUE: Multidetector CT imaging of the abdomen and pelvis was performed using the standard protocol following bolus administration of intravenous contrast. CONTRAST:  124m ISOVUE-300 IOPAMIDOL (ISOVUE-300) INJECTION 61% COMPARISON:  None. FINDINGS: Lower chest:  Aortic valve replacement.  No acute finding Hepatobiliary: No focal liver abnormality.Cholecystectomy with normal common bile duct diameter Pancreas: Unremarkable. Spleen: Low-density following the lateral capsule, likely from remote injury. No acute finding.  Adrenals/Urinary Tract: Negative adrenals. No hydronephrosis or stone. Unremarkable bladder. Stomach/Bowel: Fat stranding around a descending sigmoid junction diverticulum. Patient is status post sigmoidectomy. Appendix not identified. No pericecal inflammation. No bowel obstruction. Vascular/Lymphatic: Extensive atherosclerosis. Diffuse irregular plaque and intimal distortion along the aorta. No acute vascular finding. Major mesenteric vessels are patent. No mass or adenopathy. Reproductive:Hysterectomy. Other: No ascites or pneumoperitoneum. Musculoskeletal: Degenerative changes without acute finding. IMPRESSION: 1. Uncomplicated diverticulitis at the descending sigmoid junction. 2. Advanced atherosclerosis and other chronic findings described above. Electronically Signed   By: Monte Fantasia M.D.   On: 08/16/2016 07:45      Assessment/Plan 1. GI bleed. Appears to be a brisk lower GI bleed. If the source of the bleeding can be identified, embolization would certainly be an excellent option. I have discussed this with the patient and her husband in detail. They desire to proceed. I will await  the results of her GI bleeding scan. 2. DM. Stable on outpatient medications and blood glucose control important in reducing the progression of atherosclerotic disease. Also, involved in wound healing. On appropriate medications. 3. CAD. s/p CABG. Anemia could be problematic in cardiac ischemia.  Getting PRBC.    addendum. The patient just completed a bleeding scan and I have independently reviewed this scan. This appears grossly positive in the descending colon. This is clearly a life-threatening situation and attempts at stopping the bleeding are currently indicated. We will take her straight to the angiography suite for embolization.   Leotis Pain, MD  08/16/2016 4:57 PM    This note was created with Dragon medical transcription system.  Any error is purely unintentional

## 2016-08-16 NOTE — Op Note (Signed)
[facility] Percutaneous Study/Intervention Procedural Note     Surgeon(s): [employer]  Assistants: none  Pre-operative Diagnosis: 1. Lower GI bleed 2. Anemia and hypotension  Post-operative diagnosis: Same  Procedure(s) Performed: 1. Ultrasound guidance for vascular access right femoral artery 2. Catheter placement into the IMA and into 3 IMA branches including the left colic and 2 sigmoidal branches 3. Aortogram and selective angiogram of the IMA including selective imaging of the left colic and 2 sigmoidal branches of the IMA 4. Microbead embolization of the left colic branch and 2 sigmoidal branches of the IMA with 5 cc of 500-700  polyvinyl alcohol beads. 5. StarClose closure device right femoral artery  Anesthesia: Moderate conscious sedation for approximately 25 minutes using 4 mg of Versed and 150 mcg of Fentanyl  EBL: Minimal  Fluoro Time: 7.2 minutes  Contrast: 65 cc  Indications: Patient is a 71 y.o.female with brisk lower GI bleeding with resultant anemia. The patient has a nuclear medicine study showing active bleeding in the left colon and sigmoidal region. The patient is brought in for angiography for further evaluation and potential treatment. Risks and benefits are discussed and informed consent is obtained  Procedure: The patient was identified and appropriate procedural time out was performed. The patient was then placed supine on the table and prepped and draped in the usual sterile fashion.Moderate conscious sedation was administered during a face to face encounter with the patient throughout the procedure with my supervision of the RN administering medicines and monitoring the patient's vital signs, pulse oximetry, telemetry and mental status throughout from the start of the procedure until the patient was taken to the recovery room.   Ultrasound was used to evaluate the right common femoral artery. It was patent . A digital ultrasound image was acquired. A Seldinger needle was used to access the right common femoral artery under direct ultrasound guidance and a permanent image was performed. A 0.035 J wire was advanced without resistance and a 5Fr sheath was placed. Pigtail catheter was placed into the aorta and an AP aortogram was performed. This demonstrated a very irregular aorta with significant calcific atherosclerosis and some mural plaque. The renal arteries appeared patent although not well imaged. The IMA came off in the distal aorta and appeared patent. The iliac arteries were widely patent. We transitioned to the RAO projection to cannulate the IMA. A VS 1 catheter would not gaining good purchase into the IMA so I exchanged for a rim catheter was used to selectively cannulate the  IMA without difficulty. Selective imaging of the IMA was then performed demonstrating a brisk blush into the sigmoidal region. There were 3 main IMA branches which fed this region with 2 sigmoidal branches one being larger than the other and the left colonic branch had a long downgoing branch which also fed this region. Based on her continued bleeding and the nuclear medicine study I elected to treat this area with embolization. I initially advanced the Pro-Great microcatheter out the  left colic branch and instilled approximately 1 cc of 500-700  polyvinyl alcohol beads in this location. Angiogram following this showed the main vessels to be open with less brisk filling. I then pulled back and cannulated the larger of the two sigmoidal branches with the Pro-great microcatheter without difficulty. Selective imaging was performed which showed continued active bleeding. 2 cc of 500-700 micron polyvinyl alcohol beads were deployed in the larger sigmoidal branch.  I then pulled back into the common trunk for the  sigmoidal branches and imaging was done and I  cannulated the smaller sigmoidal branch with the Progreat microcatheter.  An additional 1 cc of 500-700 micron beads was deployed in the smaller sigmoidal branch. Again, completion angiogram showed the main vessels to be open with less brisk filling. I pulled back into the main trunk of the two sigmoidal branches and instilled another 1 cc of 500-700 micron PVA beads in this area.  Following this, only the main vessels showed flow with no further pooling or active bleeding. I elected to terminate the procedure. The diagnostic catheter was removed. StarClose closure device was deployed in usual fashion with excellent hemostatic result. The patient was taken to the recovery room in stable condition having tolerated the procedure well.     Findings: brisk pooling of blood in the sigmoid colon.  Three branches feeding this area, all treated with PVA embolization.  Disposition: Patient was taken to the recovery room in stable condition having tolerated the procedure well.  Complications: None  Leotis Pain 08/16/2016 6:05 PM   This note was created with Dragon Medical transcription system. Any errors in dictation are purely unintentional.

## 2016-08-16 NOTE — Consult Note (Signed)
Date of Consultation:  08/16/2016  Requesting Physician:  Acute diverticulitis with GI bleed  Reason for Consultation:  Acute diverticulitis with GI bleed  History of Present Illness: [name] is a 71 y.o. female who presents with a one-day history of left lower quadrant abdominal pain with bleeding per rectum. This all started this morning around 3 AM. In the emergency room she had a CT scan which showed uncomplicated diverticulitis at the junction of the descending colon and sigmoid colon. Initially her hemoglobin level was 11.4 and has since decreased to 7.2 after having several episodes of bloody bowel movements. She underwent a tagged red cell scan which was positive for active GI bleeding in the left lower quadrant. She was taking emergently to interventional radiology with Dr. Lucky Cowboy with vascular surgery as she was continuing to bleed and was becoming hypotensive and underwent coil embolization of the left colic branch and 2 sigmoidal branches of the IMA. Currently she is in the ICU his general surgery has been consulted for the possibility of resection.  Of note the patient does have a history of diverticulitis in the past and underwent prior resection. Currently the patient is still somnolent recovering from the anesthesia and I am unable to obtain too many details on her history and history of present illness.  Past Medical History: Past Medical History:  Diagnosis Date  . ACE-inhibitor cough   . Allergic rhinitis   . Anemia    iron deficiency anemia  . Anxiety   . Arthritis   . Asthma   . Cataract   . Chronic depression   . Chronic headache   . COPD (chronic obstructive pulmonary disease) ([place])   . Coronary artery disease    a. s/p CABG x2 (LIMA-LAD and SVG-RCA)  b. NST neg for ischemia 07/24/14  . Diabetes mellitus    type 2  . Diverticulitis of colon   . GERD (gastroesophageal reflux disease)   . H/O aortic valve replacement   . Hearing loss   . History of blood  transfusion 2013  . HOH (hard of hearing)   . Hypercholesterolemia    intolerance of statins and niaspan  . OSA (obstructive sleep apnea)    mild, intolerant of cpap  . PONV (postoperative nausea and vomiting)   . Thyroid disease      Past Surgical History: Past Surgical History:  Procedure Laterality Date  . ABDOMINAL HYSTERECTOMY    . ABDOMINAL HYSTERECTOMY W/ PARTIAL VAGINACTOMY    . AORTIC VALVE REPLACEMENT N/A 10/12/2013   Procedure: AORTIC VALVE REPLACEMENT (AVR);  Surgeon: [name], MD;  Location: [facility];  Service: Open Heart Surgery;  Laterality: N/A;  . [place]  . BARTHOLIN GLAND CYST EXCISION    . BLADDER SUSPENSION    . BREAST BIOPSY Bilateral 09/11/2000   neg  . BREAST BIOPSY Left 07/24/2010   neg  . BREAST CYST EXCISION  1988   bilateral nonmalignant tumors, x3  . CARDIAC CATHETERIZATION    . CARDIAC CATHETERIZATION N/A 05/25/2016   Procedure: Coronary Balloon Angioplasty;  Surgeon: Leonie Man, MD;  Location: [facility];  Service: Cardiovascular;  Laterality: N/A;  . CARDIAC CATHETERIZATION N/A 05/25/2016   Procedure: Coronary/Graft Angiography;  Surgeon: Leonie Man, MD;  Location: [facility];  Service: Cardiovascular;  Laterality: N/A;  . CATARACT EXTRACTION W/ INTRAOCULAR LENS  IMPLANT, BILATERAL    . CHOLECYSTECTOMY  2001  . COLECTOMY     lap sigmoid  .  COLONOSCOPY  2014   polyps found, 2 clamped off.  . CORONARY ANGIOPLASTY  10/29/2007   Prox RCA & Mid Cx.  . CORONARY ARTERY BYPASS GRAFT N/A 10/12/2013   Procedure: CORONARY ARTERY BYPASS GRAFT TIMES TWO;  Surgeon: [name], MD;  Location: [facility];  Service: Open Heart Surgery;  Laterality: N/A;  . LEFT HEART CATHETERIZATION WITH CORONARY ANGIOGRAM N/A 10/09/2013   Procedure: LEFT HEART CATHETERIZATION WITH CORONARY ANGIOGRAM;  Surgeon: [name], MD;  Location: [facility];  Service: Cardiovascular;  Laterality: N/A;  . STERNAL WIRES REMOVAL N/A 04/13/2014    Procedure: STERNAL WIRES REMOVAL;  Surgeon: [name], MD;  Location: [facility] OR;  Service: Thoracic;  Laterality: N/A;  . THYROIDECTOMY    . TONSILLECTOMY    . TUBAL LIGATION    . VAGINAL DELIVERY     3    Home Medications: Prior to Admission medications   Medication Sig Start Date End Date Taking? Authorizing Provider  aspirin EC 81 MG tablet Take 81 mg by mouth daily.   Yes Historical Provider, MD  Calcium-Vitamin D (CALTRATE 600 PLUS-VIT D PO) Take 2 tablets by mouth 2 (two) times daily.    Yes Historical Provider, MD  esomeprazole (NEXIUM) 40 MG capsule Take 40 mg by mouth daily at 12 noon.    Yes Historical Provider, MD  FLUoxetine (PROZAC) 40 MG capsule Take 40 mg by mouth 2 (two) times daily. 07/29/15  Yes Historical Provider, MD  insulin NPH-regular Human (NOVOLIN 70/30) (70-30) 100 UNIT/ML injection Inject 70 Units into the skin 2 (two) times daily.   Yes Historical Provider, MD  levothyroxine (SYNTHROID, LEVOTHROID) 150 MCG tablet Take 150 mcg by mouth daily before breakfast.   Yes Historical Provider, MD  prasugrel (EFFIENT) 10 MG TABS tablet Take 10 mg by mouth daily.   Yes Historical Provider, MD  traZODone (DESYREL) 100 MG tablet TAKE TWO TABLETS BY MOUTH AT BEDTIME 08/15/15  Yes Lucille Passy, MD  albuterol (PROAIR HFA) 108 (90 Base) MCG/ACT inhaler Inhale 2 puffs into the lungs every 4 (four) hours as needed for wheezing or shortness of breath. 07/17/16   Flora Lipps, MD  dimenhyDRINATE (DRAMAMINE) 50 MG tablet Take 50 mg by mouth every 6 (six) hours as needed.     Historical Provider, MD  nitroGLYCERIN (NITROSTAT) 0.4 MG SL tablet Place 0.4 mg under the tongue every 5 (five) minutes as needed for chest pain.    Historical Provider, MD  Jonetta Speak LANCETS 63J MISC USE TO CHECK BLOOD SUGAR THREE TIMES DAILY 05/17/14   Historical Provider, MD    Allergies: Allergies  Allergen Reactions  . Amitriptyline Other (See Comments)    Unknown reaction  . Benadryl  [Diphenhydramine] Shortness Of Breath  . Demerol [Meperidine] Other (See Comments)    Unknown reaction  . Gabapentin Other (See Comments)    Unknown reaction  . Loratadine Other (See Comments)    Unknown reaction  . Meperidine Hcl Other (See Comments)    Unknown reaction  . Mirtazapine Other (See Comments)    Unknown reaction  . Olanzapine Other (See Comments)    Unknown reaction   . Voltaren [Diclofenac Sodium] Shortness Of Breath  . Zetia [Ezetimibe] Other (See Comments)    Weakness in legs, shakiness all over  . Ativan [Lorazepam] Other (See Comments)    Causes double vision at highter than .5 mg dose  . Atorvastatin Other (See Comments)    Muscle aches and weakness  . Budesonide-Formoterol  Fumarate Other (See Comments)    Shakiness, tremors  . Bupropion Hcl Other (See Comments)    "cloud over me" depression  . Caffeine Other (See Comments)    jitters  . Codeine Sulfate Other (See Comments)    Makes chest hurt like a heart attack  . Lisinopril Cough  . Metformin Nausea And Vomiting  . Mometasone Furoate Nausea And Vomiting  . Morphine Sulfate Other (See Comments)    Chest pain like a heart attack  . Other Other (See Comments)    Beta Blockers, reaction shortness of breath  . Oxycodone-Acetaminophen Nausea And Vomiting  . Pioglitazone Other (See Comments)    Cannot take because of risk of bladder cancer  . Propoxyphene N-Acetaminophen Nausea And Vomiting  . Rosuvastatin Other (See Comments)    Muscle aches and weakness  . Shellfish Allergy Diarrhea  . Tramadol Nausea Only  . Venlafaxine Other (See Comments)    Unknown reaction  . Zolpidem Tartrate Other (See Comments)     Jittery, diarrhea  . Latex Rash    Social History:  reports that she quit smoking about 2 years ago. Her smoking use included Cigarettes. She has a 15.00 pack-year smoking history. She has never used smokeless tobacco. She reports that she does not drink alcohol or use drugs.   Family  History: Family History  Problem Relation Age of Onset  . Breast cancer Mother 60  . Hypertension Father   . Mesothelioma Father   . Asthma Father   . Stroke Paternal Grandfather   . Heart disease    . Breast cancer Maternal Aunt   . Breast cancer Paternal Aunt     Review of Systems: Review of Systems  Unable to perform ROS: Critical illness    Physical Exam BP (!) 82/58 Comment: RN in OR with MD and OR nurses. Dr. Lucky Cowboy present is aware  Pulse 86   Temp (!) 93 F (33.9 C) (Axillary)   Resp 14   Ht 5\' 6"  (1.676 m)   Wt 87.5 kg (193 lb)   SpO2 98%   BMI 31.15 kg/m  CONSTITUTIONAL: No acute distress HEENT:  Normocephalic, atraumatic, extraocular motion intact. NECK: Trachea is midline, and there is no jugular venous distension.  RESPIRATORY:  Lungs are clear, and breath sounds are equal bilaterally. Normal respiratory effort without pathologic use of accessory muscles. CARDIOVASCULAR: Heart is regular rhythm with low grade tachycardia, without murmurs, gallops, or rubs. GI: The abdomen is soft, nondistended, with mild tenderness to palpation in the left lower quadrant. Previous low midline scar well-healed.  MUSCULOSKELETAL:  Normal muscle strength and tone in all four extremities.  No peripheral edema or cyanosis. SKIN: Skin turgor is normal. There are no pathologic skin lesions.  NEUROLOGIC:  Motor and sensation is grossly normal.  Cranial nerves are grossly intact. PSYCH:  Alert and oriented to person, place and time. Affect is normal.  Laboratory Analysis: Results for orders placed or performed during the hospital encounter of 08/16/16 (from the past 24 hour(s))  CBC     Status: Abnormal   Collection Time: 08/16/16  6:32 AM  Result Value Ref Range   WBC 15.6 (H) 3.6 - 11.0 K/uL   RBC 4.33 3.80 - 5.20 MIL/uL   Hemoglobin 11.4 (L) 12.0 - 16.0 g/dL   HCT 34.6 (L) 35.0 - 47.0 %   MCV 79.9 (L) 80.0 - 100.0 fL   MCH 26.3 26.0 - 34.0 pg   MCHC 32.9 32.0 - 36.0 g/dL   RDW  15.5 (H) 11.5 - 14.5 %   Platelets 185 150 - 440 K/uL  Comprehensive metabolic panel     Status: Abnormal   Collection Time: 08/16/16  6:32 AM  Result Value Ref Range   Sodium 135 135 - 145 mmol/L   Potassium 4.2 3.5 - 5.1 mmol/L   Chloride 102 101 - 111 mmol/L   CO2 25 22 - 32 mmol/L   Glucose, Bld 243 (H) 65 - 99 mg/dL   BUN 18 6 - 20 mg/dL   Creatinine, Ser 0.80 0.44 - 1.00 mg/dL   Calcium 8.2 (L) 8.9 - 10.3 mg/dL   Total Protein 6.6 6.5 - 8.1 g/dL   Albumin 3.2 (L) 3.5 - 5.0 g/dL   AST 17 15 - 41 U/L   ALT 13 (L) 14 - 54 U/L   Alkaline Phosphatase 83 38 - 126 U/L   Total Bilirubin 0.5 0.3 - 1.2 mg/dL   GFR calc non Af Amer >60 >60 mL/min   GFR calc Af Amer >60 >60 mL/min   Anion gap 8 5 - 15  Protime-INR     Status: None   Collection Time: 08/16/16  6:32 AM  Result Value Ref Range   Prothrombin Time 14.6 11.4 - 15.2 seconds   INR 1.13   Type and screen [facility]     Status: None (Preliminary result)   Collection Time: 08/16/16  6:32 AM  Result Value Ref Range   ABO/RH(D) B POS    Antibody Screen NEG    Sample Expiration 08/19/2016    Unit Number [id number]    Blood Component Type RED CELLS,LR    Unit division 00    Status of Unit ISSUED    Transfusion Status OK TO TRANSFUSE    Crossmatch Result Compatible    Unit Number [id number]    Blood Component Type RED CELLS,LR    Unit division 00    Status of Unit ALLOCATED    Transfusion Status OK TO TRANSFUSE    Crossmatch Result Compatible   CBC     Status: Abnormal   Collection Time: 08/16/16  9:06 AM  Result Value Ref Range   WBC 12.9 (H) 3.6 - 11.0 K/uL   RBC 3.69 (L) 3.80 - 5.20 MIL/uL   Hemoglobin 9.7 (L) 12.0 - 16.0 g/dL   HCT 29.5 (L) 35.0 - 47.0 %   MCV 79.8 (L) 80.0 - 100.0 fL   MCH 26.3 26.0 - 34.0 pg   MCHC 33.0 32.0 - 36.0 g/dL   RDW 15.8 (H) 11.5 - 14.5 %   Platelets 160 150 - 440 K/uL  Hemoglobin and hematocrit, blood     Status: Abnormal   Collection Time: 08/16/16  11:27 AM  Result Value Ref Range   Hemoglobin 9.8 (L) 12.0 - 16.0 g/dL   HCT 29.5 (L) 35.0 - 47.0 %  ABO/Rh     Status: None   Collection Time: 08/16/16 11:27 AM  Result Value Ref Range   ABO/RH(D) B POS   Glucose, capillary     Status: Abnormal   Collection Time: 08/16/16 11:38 AM  Result Value Ref Range   Glucose-Capillary 178 (H) 65 - 99 mg/dL  Prepare RBC     Status: None   Collection Time: 08/16/16  3:00 PM  Result Value Ref Range   Order Confirmation ORDER PROCESSED BY BLOOD BANK   Hemoglobin     Status: Abnormal   Collection Time: 08/16/16  3:48 PM  Result Value Ref Range  Hemoglobin 7.2 (L) 12.0 - 16.0 g/dL  Glucose, capillary     Status: Abnormal   Collection Time: 08/16/16  6:27 PM  Result Value Ref Range   Glucose-Capillary 355 (H) 65 - 99 mg/dL    Imaging: Nm Gi Blood Loss  Result Date: 08/16/2016 CLINICAL DATA:  Lower GI bleed. EXAM: NUCLEAR MEDICINE GASTROINTESTINAL BLEEDING SCAN TECHNIQUE: Sequential abdominal images were obtained following intravenous administration of Tc-25m labeled red blood cells. RADIOPHARMACEUTICALS:  23.6 mCi Tc-45m in-vitro labeled red cells. COMPARISON:  None. FINDINGS: On the early images there is a 8 tubular shaped focus of increased radiotracer uptake in the left lower quadrant of the abdomen. This increases in intensity over time in there is antegrade progression of the radiotracer conforming to the expected location of the sigmoid colon. IMPRESSION: 1. Examination is positive for active GI bleeding within the left lower quadrant of the abdomen and is likely at the level of the distal descending colon/proximal sigmoid colon. Electronically Signed   By: Kerby Moors M.D.   On: 08/16/2016 17:22   Ct Abdomen Pelvis W Contrast  Result Date: 08/16/2016 CLINICAL DATA:  Left lower quadrant pain. Rectal bleeding. History of diverticulitis. EXAM: CT ABDOMEN AND PELVIS WITH CONTRAST TECHNIQUE: Multidetector CT imaging of the abdomen and pelvis  was performed using the standard protocol following bolus administration of intravenous contrast. CONTRAST:  126mL ISOVUE-300 IOPAMIDOL (ISOVUE-300) INJECTION 61% COMPARISON:  None. FINDINGS: Lower chest:  Aortic valve replacement.  No acute finding Hepatobiliary: No focal liver abnormality.Cholecystectomy with normal common bile duct diameter Pancreas: Unremarkable. Spleen: Low-density following the lateral capsule, likely from remote injury. No acute finding. Adrenals/Urinary Tract: Negative adrenals. No hydronephrosis or stone. Unremarkable bladder. Stomach/Bowel: Fat stranding around a descending sigmoid junction diverticulum. Patient is status post sigmoidectomy. Appendix not identified. No pericecal inflammation. No bowel obstruction. Vascular/Lymphatic: Extensive atherosclerosis. Diffuse irregular plaque and intimal distortion along the aorta. No acute vascular finding. Major mesenteric vessels are patent. No mass or adenopathy. Reproductive:Hysterectomy. Other: No ascites or pneumoperitoneum. Musculoskeletal: Degenerative changes without acute finding. IMPRESSION: 1. Uncomplicated diverticulitis at the descending sigmoid junction. 2. Advanced atherosclerosis and other chronic findings described above. Electronically Signed   By: Monte Fantasia M.D.   On: 08/16/2016 07:45    Assessment and Plan: This is a 71 y.o. female who presents with acute diverticulitis with lower GI bleed now status post coil embolization with vascular surgery.  I personally reviewed the patient's laboratory and imaging studies. Currently the patient has been embolized with no further bleeding noted on postembolization images. At this point the patient's vital signs are stable and she is currently getting blood transfusion. She is in intensive care unit for closer monitoring. Discussed with the nursing staff that most likely she will still have some blood per rectum given that she likely still has blood within the intestine but  that this may not be active bleeding. We'll continue monitoring the patient and following along with serial hematocrit checks.  The patient were to have further significant bleeding or hypotension or drop in her blood counts, then he may need to go to the operating room for resection. At this time the patient does not need a surgical intervention but we will keep a close eye and monitor her along with you.     Melvyn Neth, [place]

## 2016-08-16 NOTE — ED Notes (Signed)
ED Provider at bedside. 

## 2016-08-16 NOTE — H&P (Signed)
[facility] History & Physical Update  The patient was interviewed and re-examined.  The patient's previous History and Physical has been reviewed and is unchanged.  There is no change in the plan of care. We plan to proceed with the scheduled procedure.  Leotis Pain, MD  08/16/2016, 5:19 PM

## 2016-08-16 NOTE — H&P (Addendum)
[facility] NAME: [name]    MR#:  [id number]  DATE OF BIRTH:  27-Oct-1945  DATE OF ADMISSION:  08/16/2016  PRIMARY CARE PHYSICIAN: Singh,Jasmine, MD   REQUESTING/REFERRING PHYSICIAN: stafford , md   CHIEF COMPLAINT:   Abdominal pain and rectal bleed HISTORY OF PRESENT ILLNESS:  [name]  is a 71 y.o. female with a known history of Coronary artery disease status post CABG, COPD, diabetes mellitus, hypothyroidism, history of a diverticulitis in the past status post partial colectomy for the same and other medical problems is presenting to the ED with a chief complaint of lower abdominal pain and bleeding per rectum started at around 3 AM today. Denies any fever. CT abdomen in the ED has revealed acute diverticulitis hospitalist team is called to admit the patient. Patient is reporting 7 out of 10 lower abdominal pain during my examination and had 2 episodes of active bleeding.  PAST MEDICAL HISTORY:   Past Medical History:  Diagnosis Date  . ACE-inhibitor cough   . Allergic rhinitis   . Anemia    iron deficiency anemia  . Anxiety   . Arthritis   . Asthma   . Cataract   . Chronic depression   . Chronic headache   . COPD (chronic obstructive pulmonary disease) ([place])   . Coronary artery disease    a. s/p CABG x2 (LIMA-LAD and SVG-RCA)  b. NST neg for ischemia 07/24/14  . Diabetes mellitus    type 2  . Diverticulitis of colon   . GERD (gastroesophageal reflux disease)   . H/O aortic valve replacement   . Hearing loss   . History of blood transfusion 2013  . HOH (hard of hearing)   . Hypercholesterolemia    intolerance of statins and niaspan  . OSA (obstructive sleep apnea)    mild, intolerant of cpap  . PONV (postoperative nausea and vomiting)   . Thyroid disease     PAST SURGICAL HISTOIRY:   Past Surgical History:  Procedure Laterality Date  . ABDOMINAL HYSTERECTOMY    . ABDOMINAL HYSTERECTOMY W/  PARTIAL VAGINACTOMY    . AORTIC VALVE REPLACEMENT N/A 10/12/2013   Procedure: AORTIC VALVE REPLACEMENT (AVR);  Surgeon: [name], MD;  Location: [facility];  Service: Open Heart Surgery;  Laterality: N/A;  . [place]  . BARTHOLIN GLAND CYST EXCISION    . BLADDER SUSPENSION    . BREAST BIOPSY Bilateral 09/11/2000   neg  . BREAST BIOPSY Left 07/24/2010   neg  . BREAST CYST EXCISION  1988   bilateral nonmalignant tumors, x3  . CARDIAC CATHETERIZATION    . CARDIAC CATHETERIZATION N/A 05/25/2016   Procedure: Coronary Balloon Angioplasty;  Surgeon: Leonie Man, MD;  Location: [facility];  Service: Cardiovascular;  Laterality: N/A;  . CARDIAC CATHETERIZATION N/A 05/25/2016   Procedure: Coronary/Graft Angiography;  Surgeon: Leonie Man, MD;  Location: [facility];  Service: Cardiovascular;  Laterality: N/A;  . CATARACT EXTRACTION W/ INTRAOCULAR LENS  IMPLANT, BILATERAL    . CHOLECYSTECTOMY  2001  . COLECTOMY     lap sigmoid  . COLONOSCOPY  2014   polyps found, 2 clamped off.  . CORONARY ANGIOPLASTY  10/29/2007   Prox RCA & Mid Cx.  . CORONARY ARTERY BYPASS GRAFT N/A 10/12/2013   Procedure: CORONARY ARTERY BYPASS GRAFT TIMES TWO;  Surgeon: [name], MD;  Location: [facility];  Service: Open Heart Surgery;  Laterality: N/A;  . LEFT HEART CATHETERIZATION WITH CORONARY ANGIOGRAM N/A 10/09/2013   Procedure: LEFT HEART CATHETERIZATION WITH CORONARY ANGIOGRAM;  Surgeon: [name], MD;  Location[facility] C[facility];  Service: Cardiovascular;  Laterality: N/A;  . STERNAL WIRES REMOVAL N/A 04/13/2014   Procedure: STERNAL WIRES REMOVAL;  Surgeon: [name], MD;  Location: [facility] OR;  Service: Thoracic;  Laterality: N/A;  . THYROIDECTOMY    . TONSILLECTOMY    . TUBAL LIGATION    . VAGINAL DELIVERY     3    SOCIAL HISTORY:   Social History  Substance Use Topics  . Smoking status: Former Smoker    Packs/day: 0.50    Years: 30.00    Types: Cigarettes    Quit  date: 10/02/2013  . Smokeless tobacco: Never Used  . Alcohol use No    FAMILY HISTORY:   Family History  Problem Relation Age of Onset  . Breast cancer Mother 44  . Hypertension Father   . Mesothelioma Father   . Asthma Father   . Stroke Paternal Grandfather   . Heart disease    . Breast cancer Maternal Aunt   . Breast cancer Paternal Aunt     DRUG ALLERGIES:   Allergies  Allergen Reactions  . Amitriptyline Other (See Comments)    Unknown reaction  . Benadryl [Diphenhydramine] Shortness Of Breath  . Demerol [Meperidine] Other (See Comments)    Unknown reaction  . Gabapentin Other (See Comments)    Unknown reaction  . Loratadine Other (See Comments)    Unknown reaction  . Meperidine Hcl Other (See Comments)    Unknown reaction  . Mirtazapine Other (See Comments)    Unknown reaction  . Olanzapine Other (See Comments)    Unknown reaction   . Voltaren [Diclofenac Sodium] Shortness Of Breath  . Zetia [Ezetimibe] Other (See Comments)    Weakness in legs, shakiness all over  . Ativan [Lorazepam] Other (See Comments)    Causes double vision at highter than .5 mg dose  . Atorvastatin Other (See Comments)    Muscle aches and weakness  . Budesonide-Formoterol Fumarate Other (See Comments)    Shakiness, tremors  . Bupropion Hcl Other (See Comments)    "cloud over me" depression  . Caffeine Other (See Comments)    jitters  . Codeine Sulfate Other (See Comments)    Makes chest hurt like a heart attack  . Lisinopril Cough  . Metformin Nausea And Vomiting  . Mometasone Furoate Nausea And Vomiting  . Morphine Sulfate Other (See Comments)    Chest pain like a heart attack  . Other Other (See Comments)    Beta Blockers, reaction shortness of breath  . Oxycodone-Acetaminophen Nausea And Vomiting  . Pioglitazone Other (See Comments)    Cannot take because of risk of bladder cancer  . Propoxyphene N-Acetaminophen Nausea And Vomiting  . Rosuvastatin Other (See Comments)     Muscle aches and weakness  . Shellfish Allergy Diarrhea  . Tramadol Nausea Only  . Venlafaxine Other (See Comments)    Unknown reaction  . Zolpidem Tartrate Other (See Comments)     Jittery, diarrhea  . Latex Rash    REVIEW OF SYSTEMS:  CONSTITUTIONAL: No fever, fatigue or weakness.  EYES: No blurred or double vision.  EARS, NOSE, AND THROAT: No tinnitus or ear pain.  RESPIRATORY: No cough, shortness of breath, wheezing or hemoptysis.  CARDIOVASCULAR: No chest pain, orthopnea, edema.  GASTROINTESTINAL: Reporting 7 out of 10 lower abdominal pain  and rectal bleed. Denies any nausea vomiting or constipation GENITOURINARY: No dysuria, hematuria.  ENDOCRINE: No polyuria, nocturia,  HEMATOLOGY: No anemia, easy bruising or bleeding SKIN: No rash or lesion. MUSCULOSKELETAL: No joint pain or arthritis.   NEUROLOGIC: No tingling, numbness, weakness.  PSYCHIATRY: No anxiety or depression.   MEDICATIONS AT HOME:   Prior to Admission medications   Medication Sig Start Date End Date Taking? Authorizing Provider  aspirin EC 81 MG tablet Take 81 mg by mouth daily.   Yes Historical Provider, MD  Calcium-Vitamin D (CALTRATE 600 PLUS-VIT D PO) Take 2 tablets by mouth 2 (two) times daily.    Yes Historical Provider, MD  esomeprazole (NEXIUM) 40 MG capsule Take 40 mg by mouth daily at 12 noon.    Yes Historical Provider, MD  FLUoxetine (PROZAC) 40 MG capsule Take 40 mg by mouth 2 (two) times daily. 07/29/15  Yes Historical Provider, MD  insulin NPH-regular Human (NOVOLIN 70/30) (70-30) 100 UNIT/ML injection Inject 70 Units into the skin 2 (two) times daily.   Yes Historical Provider, MD  levothyroxine (SYNTHROID, LEVOTHROID) 150 MCG tablet Take 150 mcg by mouth daily before breakfast.   Yes Historical Provider, MD  prasugrel (EFFIENT) 10 MG TABS tablet Take 10 mg by mouth daily.   Yes Historical Provider, MD  traZODone (DESYREL) 100 MG tablet TAKE TWO TABLETS BY MOUTH AT BEDTIME 08/15/15  Yes Lucille Passy, MD  albuterol (PROAIR HFA) 108 (90 Base) MCG/ACT inhaler Inhale 2 puffs into the lungs every 4 (four) hours as needed for wheezing or shortness of breath. 07/17/16   Flora Lipps, MD  dimenhyDRINATE (DRAMAMINE) 50 MG tablet Take 50 mg by mouth every 6 (six) hours as needed.     Historical Provider, MD  nitroGLYCERIN (NITROSTAT) 0.4 MG SL tablet Place 0.4 mg under the tongue every 5 (five) minutes as needed for chest pain.    Historical Provider, MD  [facility] DELICA LANCETS 16X MISC USE TO CHECK BLOOD SUGAR THREE TIMES DAILY 05/17/14   Historical Provider, MD      VITAL SIGNS:  Blood pressure 114/64, pulse 72, temperature 98 F (36.7 C), temperature source Oral, resp. rate 19, height 5\' 6"  (1.676 m), weight 87.5 kg (193 lb), SpO2 94 %.  PHYSICAL EXAMINATION:  GENERAL:  71 y.o.-year-old patient lying in the bed with no acute distress.  EYES: Pupils equal, round, reactive to light and accommodation. No scleral icterus. Extraocular muscles intact.  HEENT: Head atraumatic, normocephalic. Oropharynx and nasopharynx clear.  NECK:  Supple, no jugular venous distention. No thyroid enlargement, no tenderness.  LUNGS: Normal breath sounds bilaterally, no wheezing, rales,rhonchi or crepitation. No use of accessory muscles of respiration.  CARDIOVASCULAR: S1, S2 normal. No murmurs, rubs, or gallops.  ABDOMEN: Lower abdomen is tender but no rebound tenderness, nondistended. Bowel sounds present. No organomegaly or mass.  EXTREMITIES: No pedal edema, cyanosis, or clubbing.  NEUROLOGIC: Cranial nerves II through XII are intact. Muscle strength 5/5 in all extremities. Sensation intact. Gait not checked.  PSYCHIATRIC: The patient is alert and oriented x 3.  SKIN: No obvious rash, lesion, or ulcer.   LABORATORY PANEL:   CBC  Recent Labs Lab 08/16/16 0906 08/16/16 1127  WBC 12.9*  --   HGB 9.7* 9.8*  HCT 29.5* 29.5*  PLT 160  --     ------------------------------------------------------------------------------------------------------------------  Chemistries   Recent Labs Lab 08/16/16 0632  NA 135  K 4.2  CL 102  CO2 25  GLUCOSE 243*  BUN 18  CREATININE 0.80  CALCIUM 8.2*  AST 17  ALT 13*  ALKPHOS 83  BILITOT 0.5   ------------------------------------------------------------------------------------------------------------------  Cardiac Enzymes No results for input(s): TROPONINI in the last 168 hours. ------------------------------------------------------------------------------------------------------------------  RADIOLOGY:  Ct Abdomen Pelvis W Contrast  Result Date: 08/16/2016 CLINICAL DATA:  Left lower quadrant pain. Rectal bleeding. History of diverticulitis. EXAM: CT ABDOMEN AND PELVIS WITH CONTRAST TECHNIQUE: Multidetector CT imaging of the abdomen and pelvis was performed using the standard protocol following bolus administration of intravenous contrast. CONTRAST:  188mL ISOVUE-300 IOPAMIDOL (ISOVUE-300) INJECTION 61% COMPARISON:  None. FINDINGS: Lower chest:  Aortic valve replacement.  No acute finding Hepatobiliary: No focal liver abnormality.Cholecystectomy with normal common bile duct diameter Pancreas: Unremarkable. Spleen: Low-density following the lateral capsule, likely from remote injury. No acute finding. Adrenals/Urinary Tract: Negative adrenals. No hydronephrosis or stone. Unremarkable bladder. Stomach/Bowel: Fat stranding around a descending sigmoid junction diverticulum. Patient is status post sigmoidectomy. Appendix not identified. No pericecal inflammation. No bowel obstruction. Vascular/Lymphatic: Extensive atherosclerosis. Diffuse irregular plaque and intimal distortion along the aorta. No acute vascular finding. Major mesenteric vessels are patent. No mass or adenopathy. Reproductive:Hysterectomy. Other: No ascites or pneumoperitoneum. Musculoskeletal: Degenerative changes without  acute finding. IMPRESSION: 1. Uncomplicated diverticulitis at the descending sigmoid junction. 2. Advanced atherosclerosis and other chronic findings described above. Electronically Signed   By: Monte Fantasia M.D.   On: 08/16/2016 07:45    EKG:   Orders placed or performed in visit on 06/28/16  . EKG 12-Lead    IMPRESSION AND PLAN:   [name]  is a 71 y.o. female with a known history of Coronary artery disease status post CABG, COPD, diabetes mellitus, hypothyroidism, history of a diverticulitis in the past status post partial colectomy for the same and other medical problems is presenting to the ED with a chief complaint of lower abdominal pain and bleeding per rectum started at around 3 AM today. Denies any fever. CT abdomen in the ED has revealed acute diverticulitis hospitalist team is called to admit the patient. Patient is reporting 7 out of 10 lower abdominal pain  #Acute lower abdominal pain with lower GI bleed secondary to acute diverticulitis Admit to MedSurg unit Monitor hemoglobin and hematocrit every 6 hours and transfuse as needed if hemoglobin is less than 8 GI consult is placed and discussed with Dr. Caleen Jobs Will get stat bleeding scan the patient is actively bleeding, discussed with vascular surgery Dr. dew for possible embolization if patient is actively bleeding, will consult depending on the scan results -Gen. surgery consult is placed and discussed with Dr. Maximino Greenland possible colectomy if bleeding is uncontrollable. Patient had history of colectomy in the past for acute diverticulitis -Levo Floxin and Flagyl -Pain management as needed -Nothing by mouth and IV fluids -Hold aspirin and Effient have home medications  #Insulin dependen diabetes mellitus Currently patient is nothing by mouth on IV fluids provided sliding scale insulin Check hemoglobin A1c in a.m.  #History of coronary artery disease status post CABG Hold aspirin and Effient in view of active GI  bleed Patient denies any chest pain at this time  #Chronic history of COPD no acute exacerbation Provide nebulizer treatments as needed basis  #Hypothyroidism continue Synthroid Check TSH in a.m.  Patient is actively bleeding,repeat hemoglobin at 7.2. Stat blood transition orders given discussed with the nurse Ms. Lauren and she is aware  All the records are reviewed and case discussed with ED provider. Management plans discussed with the patient, family and they are in agreement.  CODE STATUS: FC , Husband is the healthcare power of attorney  TOTAL critical TIME TAKING CARE OF THIS PATIENT: 45 minutes.   Note: This dictation was prepared with Dragon dictation along with smaller phrase technology. Any transcriptional errors that result from this process are unintentional.  Nicholes Mango M.D on 08/16/2016 at 3:53 PM  Between 7am to 6pm - Pager - [phone]  After 6pm go to [email or web address] - password [email or web address] [facility]  Office  [phone]  CC: Primary care physician; Glendon Axe, MD

## 2016-08-16 NOTE — Progress Notes (Addendum)
Jonathon Bellows MD  [address] [place], [place] [address] Phone: [phone] Fax : [phone]  Consultation  Referring Provider:     Dr Arnette Norris Primary Care Physician:  Glendon Axe, MD Primary Gastroenterologist: None     Reason for Consultation:     Rectal bleeding/diverticulitis  Date of Admission:  08/16/2016 Date of Consultation:  08/16/2016         HPI:   [name] is a 71 y.o. female presented to the ER this morning with lower abdominal pain and bright red blood per rectum.  She recalls during a previous episode of diverticulitis some years back , had part of her colon resected by Dr Jamal Collin.  She says she was doing well and was constipated last few days then subsequently developed left lower abdominal pain, porbably some fever followed by bloody bowel movements since 330 am today , she has so far had >8 bloody bowel movements. The last one was just a few minutes back with clots. She says she feels lightheaded. Denies any NSAID use. She is on Prasugrel , she has a biologic valve replaced.  Her last colonoscopy per [facility] was back in 2014 by Dr Tiffany Kocher where he noted a solitary ulcer which was clipped. In 2013 multiple adenomas resected during colonoscopy . A CT abdomen in the ER  Showed uncomplicated diverticulitis at the junction of the descending and sigmoid colon . '   Past Medical History:  Diagnosis Date  . ACE-inhibitor cough   . Allergic rhinitis   . Anemia    iron deficiency anemia  . Anxiety   . Arthritis   . Asthma   . Cataract   . Chronic depression   . Chronic headache   . COPD (chronic obstructive pulmonary disease) ([place])   . Coronary artery disease    a. s/p CABG x2 (LIMA-LAD and SVG-RCA)  b. NST neg for ischemia 07/24/14  . Diabetes mellitus    type 2  . Diverticulitis of colon   . GERD (gastroesophageal reflux disease)   . H/O aortic valve replacement   . Hearing loss   . History of blood transfusion 2013  . HOH (hard of hearing)   .  Hypercholesterolemia    intolerance of statins and niaspan  . OSA (obstructive sleep apnea)    mild, intolerant of cpap  . PONV (postoperative nausea and vomiting)   . Thyroid disease     Past Surgical History:  Procedure Laterality Date  . ABDOMINAL HYSTERECTOMY    . ABDOMINAL HYSTERECTOMY W/ PARTIAL VAGINACTOMY    . AORTIC VALVE REPLACEMENT N/A 10/12/2013   Procedure: AORTIC VALVE REPLACEMENT (AVR);  Surgeon: [name], MD;  Location: [facility];  Service: Open Heart Surgery;  Laterality: N/A;  . [place]  . BARTHOLIN GLAND CYST EXCISION    . BLADDER SUSPENSION    . BREAST BIOPSY Bilateral 09/11/2000   neg  . BREAST BIOPSY Left 07/24/2010   neg  . BREAST CYST EXCISION  1988   bilateral nonmalignant tumors, x3  . CARDIAC CATHETERIZATION    . CARDIAC CATHETERIZATION N/A 05/25/2016   Procedure: Coronary Balloon Angioplasty;  Surgeon: Leonie Man, MD;  Location: [facility];  Service: Cardiovascular;  Laterality: N/A;  . CARDIAC CATHETERIZATION N/A 05/25/2016   Procedure: Coronary/Graft Angiography;  Surgeon: Leonie Man, MD;  Location: [facility];  Service: Cardiovascular;  Laterality: N/A;  . CATARACT EXTRACTION W/ INTRAOCULAR LENS  IMPLANT, BILATERAL    . CHOLECYSTECTOMY  2001  .  COLECTOMY     lap sigmoid  . COLONOSCOPY  2014   polyps found, 2 clamped off.  . CORONARY ANGIOPLASTY  10/29/2007   Prox RCA & Mid Cx.  . CORONARY ARTERY BYPASS GRAFT N/A 10/12/2013   Procedure: CORONARY ARTERY BYPASS GRAFT TIMES TWO;  Surgeon: [name], MD;  Location: [facility];  Service: Open Heart Surgery;  Laterality: N/A;  . LEFT HEART CATHETERIZATION WITH CORONARY ANGIOGRAM N/A 10/09/2013   Procedure: LEFT HEART CATHETERIZATION WITH CORONARY ANGIOGRAM;  Surgeon: [name], MD;  Location: [facility] CATH [facility];  Service: Cardiovascular;  Laterality: N/A;  . STERNAL WIRES REMOVAL N/A 04/13/2014   Procedure: STERNAL WIRES REMOVAL;  Surgeon: [name], MD;   [facility];  Service: Thoracic;  Laterality: N/A;  . THYROIDECTOMY    . TONSILLECTOMY    . TUBAL LIGATION    . VAGINAL DELIVERY     3    Prior to Admission medications   Medication Sig Start Date End Date Taking? Authorizing Provider  aspirin EC 81 MG tablet Take 81 mg by mouth daily.   Yes Historical Provider, MD  Calcium-Vitamin D (CALTRATE 600 PLUS-VIT D PO) Take 2 tablets by mouth 2 (two) times daily.    Yes Historical Provider, MD  esomeprazole (NEXIUM) 40 MG capsule Take 40 mg by mouth daily at 12 noon.    Yes Historical Provider, MD  FLUoxetine (PROZAC) 40 MG capsule Take 40 mg by mouth 2 (two) times daily. 07/29/15  Yes Historical Provider, MD  insulin NPH-regular Human (NOVOLIN 70/30) (70-30) 100 UNIT/ML injection Inject 70 Units into the skin 2 (two) times daily.   Yes Historical Provider, MD  levothyroxine (SYNTHROID, LEVOTHROID) 150 MCG tablet Take 150 mcg by mouth daily before breakfast.   Yes Historical Provider, MD  prasugrel (EFFIENT) 10 MG TABS tablet Take 10 mg by mouth daily.   Yes Historical Provider, MD  traZODone (DESYREL) 100 MG tablet TAKE TWO TABLETS BY MOUTH AT BEDTIME 08/15/15  Yes Lucille Passy, MD  albuterol (PROAIR HFA) 108 (90 Base) MCG/ACT inhaler Inhale 2 puffs into the lungs every 4 (four) hours as needed for wheezing or shortness of breath. 07/17/16   Flora Lipps, MD  dimenhyDRINATE (DRAMAMINE) 50 MG tablet Take 50 mg by mouth every 6 (six) hours as needed.     Historical Provider, MD  nitroGLYCERIN (NITROSTAT) 0.4 MG SL tablet Place 0.4 mg under the tongue every 5 (five) minutes as needed for chest pain.    Historical Provider, MD  Jonetta Speak LANCETS 99B MISC USE TO CHECK BLOOD SUGAR THREE TIMES DAILY 05/17/14   Historical Provider, MD    Family History  Problem Relation Age of Onset  . Breast cancer Mother 63  . Hypertension Father   . Mesothelioma Father   . Asthma Father   . Stroke Paternal Grandfather   . Heart disease    . Breast cancer  Maternal Aunt   . Breast cancer Paternal Aunt      Social History  Substance Use Topics  . Smoking status: Former Smoker    Packs/day: 0.50    Years: 30.00    Types: Cigarettes    Quit date: 10/02/2013  . Smokeless tobacco: Never Used  . Alcohol use No    Allergies as of 08/16/2016 - Review Complete 08/16/2016  Allergen Reaction Noted  . Amitriptyline Other (See Comments) 03/06/2013  . Benadryl [diphenhydramine] Shortness Of Breath 03/06/2013  . Demerol [meperidine] Other (See Comments) 10/08/2013  . Gabapentin  Other (See Comments) 09/27/2006  . Loratadine Other (See Comments) 01/24/2014  . Meperidine hcl Other (See Comments) 09/27/2006  . Mirtazapine Other (See Comments) 09/27/2006  . Olanzapine Other (See Comments) 09/27/2006  . Voltaren [diclofenac sodium] Shortness Of Breath 01/06/2013  . Zetia [ezetimibe] Other (See Comments) 04/13/2014  . Ativan [lorazepam] Other (See Comments) 01/06/2013  . Atorvastatin Other (See Comments) 09/27/2006  . Budesonide-formoterol fumarate Other (See Comments) 09/17/2011  . Bupropion hcl Other (See Comments) 09/27/2006  . Caffeine Other (See Comments) 01/24/2014  . Codeine sulfate Other (See Comments) 09/27/2006  . Lisinopril Cough 01/28/2009  . Metformin Nausea And Vomiting 08/29/2006  . Mometasone furoate Nausea And Vomiting   . Morphine sulfate Other (See Comments)   . Other Other (See Comments)   . Oxycodone-acetaminophen Nausea And Vomiting 09/27/2006  . Pioglitazone Other (See Comments) 01/24/2014  . Propoxyphene n-acetaminophen Nausea And Vomiting 09/27/2006  . Rosuvastatin Other (See Comments)   . Shellfish allergy Diarrhea   . Tramadol Nausea Only 06/02/2015  . Venlafaxine Other (See Comments) 08/29/2006  . Zolpidem tartrate Other (See Comments)   . Latex Rash     Review of Systems:    All systems reviewed and negative except where noted in HPI.   Physical Exam:  Vital signs in last 24 hours: Temp:  [97.7 F (36.5 C)]  97.7 F (36.5 C) (03/15 0605) Pulse Rate:  [66-78] 69 (03/15 1000) Resp:  [17-24] 24 (03/15 1000) BP: (122-135)/(60-69) 122/65 (03/15 1000) SpO2:  [93 %-98 %] 93 % (03/15 1000) Weight:  [193 lb (87.5 kg)] 193 lb (87.5 kg) (03/15 8099)   General:   Pleasant, cooperative in NAD Head:  Normocephalic and atraumatic. Eyes:   No icterus.   Conjunctiva pink. PERRLA. Ears:  Normal auditory acuity. Neck:  Supple; no masses or thyroidomegaly Lungs: Respirations even and unlabored. Lungs clear to auscultation bilaterally.   No wheezes, crackles, or rhonchi.  Heart:  Regular rate and rhythm;  Without murmur, clicks, rubs or gallops Abdomen:  Soft, nondistended, mild lower abdominal tenderness Normal bowel sounds. No appreciable masses or hepatomegaly.  No rebound or guarding.  Rectal:  Not performed. Neurologic:  Alert and oriented x3;  grossly normal neurologically. Skin:  Intact without significant lesions or rashes. Cervical Nodes:  No significant cervical adenopathy. Psych:  Alert and cooperative.very anxious.  LAB RESULTS:  Recent Labs  08/16/16 0632 08/16/16 0906 08/16/16 1127  WBC 15.6* 12.9*  --   HGB 11.4* 9.7* 9.8*  HCT 34.6* 29.5* 29.5*  PLT 185 160  --    BMET  Recent Labs  08/16/16 0632  NA 135  K 4.2  CL 102  CO2 25  GLUCOSE 243*  BUN 18  CREATININE 0.80  CALCIUM 8.2*   LFT  Recent Labs  08/16/16 0632  PROT 6.6  ALBUMIN 3.2*  AST 17  ALT 13*  ALKPHOS 83  BILITOT 0.5   PT/INR  Recent Labs  08/16/16 0632  LABPROT 14.6  INR 1.13    STUDIES: Ct Abdomen Pelvis W Contrast  Result Date: 08/16/2016 CLINICAL DATA:  Left lower quadrant pain. Rectal bleeding. History of diverticulitis. EXAM: CT ABDOMEN AND PELVIS WITH CONTRAST TECHNIQUE: Multidetector CT imaging of the abdomen and pelvis was performed using the standard protocol following bolus administration of intravenous contrast. CONTRAST:  158mL ISOVUE-300 IOPAMIDOL (ISOVUE-300) INJECTION 61%  COMPARISON:  None. FINDINGS: Lower chest:  Aortic valve replacement.  No acute finding Hepatobiliary: No focal liver abnormality.Cholecystectomy with normal common bile duct diameter Pancreas: Unremarkable. Spleen:  Low-density following the lateral capsule, likely from remote injury. No acute finding. Adrenals/Urinary Tract: Negative adrenals. No hydronephrosis or stone. Unremarkable bladder. Stomach/Bowel: Fat stranding around a descending sigmoid junction diverticulum. Patient is status post sigmoidectomy. Appendix not identified. No pericecal inflammation. No bowel obstruction. Vascular/Lymphatic: Extensive atherosclerosis. Diffuse irregular plaque and intimal distortion along the aorta. No acute vascular finding. Major mesenteric vessels are patent. No mass or adenopathy. Reproductive:Hysterectomy. Other: No ascites or pneumoperitoneum. Musculoskeletal: Degenerative changes without acute finding. IMPRESSION: 1. Uncomplicated diverticulitis at the descending sigmoid junction. 2. Advanced atherosclerosis and other chronic findings described above. Electronically Signed   By: Monte Fantasia M.D.   On: 08/16/2016 07:45      Impression / Plan:   [name] is a 71 y.o. y/o female with with a history of colon polyps, diverticulitis presents to the Er today with LLQ pain and CT scan findings consistent with diverticulitis without perforation. In addition there was some rectal bleeding which is likely secondary to the diverticulitis.   Plan   1. Continue antibiotics 2. Monitor CBC, hold anticoagulants 3. Colonoscopy is contradindicated during acute diverticulitis. She has had multiple bloody bowel movements since coming in which are ongoing  , suggest recheck Hb , get tagged RBC scan stat, surgical consult . IF she is actively bleeding options are IR guided embolization or surgery . 4. If she has no further bleeding by tomorrow and pain is better then can commence on clears and advance gradually 5.  Suggest out patient colonoscopy in 6-8 weeks once diverticulitis has resolved    Thank you for involving me in the care of this patient.      LOS: 0 days   Jonathon Bellows, MD  08/16/2016, 1:06 PM

## 2016-08-16 NOTE — ED Provider Notes (Signed)
[facility] Emergency Department Provider Note   First MD Initiated Contact with Patient 08/16/16 [phone]     (approximate)  I have reviewed the triage vital signs and the nursing notes.   HISTORY  Chief Complaint GI Bleeding   HPI [name] is a 71 y.o. female with below list of chronic medical conditions including diverticulitis presents to the emergency department with 9 out of 10 left lower quadrant abdominal pain associated with bright red blood per rectum with onset this morning at 3:30. Patient states that she was recently constipated last week and as such took laxatives with resultant diarrhea. Patient states that she passed large amount of hard stools.   Past Medical History:  Diagnosis Date  . ACE-inhibitor cough   . Allergic rhinitis   . Anemia    iron deficiency anemia  . Anxiety   . Arthritis   . Asthma   . Cataract   . Chronic depression   . Chronic headache   . COPD (chronic obstructive pulmonary disease) ([place])   . Coronary artery disease    a. s/p CABG x2 (LIMA-LAD and SVG-RCA)  b. NST neg for ischemia 07/24/14  . Diabetes mellitus    type 2  . Diverticulitis of colon   . GERD (gastroesophageal reflux disease)   . H/O aortic valve replacement   . Hearing loss   . History of blood transfusion 2013  . HOH (hard of hearing)   . Hypercholesterolemia    intolerance of statins and niaspan  . OSA (obstructive sleep apnea)    mild, intolerant of cpap  . PONV (postoperative nausea and vomiting)   . Thyroid disease     Patient Active Problem List   Diagnosis Date Noted  . NSTEMI (non-ST elevated myocardial infarction) ([place])   . AVB (atrioventricular block)   . Atypical chest pain   . Unstable angina ([place]) 05/25/2016  . Palpitations 05/25/2016  . PUD - recent Rx for H.Pylori 05/25/2016  . Stenosis of coronary artery stent   . Recurrent major depressive disorder, in partial remission ([place]) 07/29/2015  . Parotiditis 04/25/2015    . Viral URI 03/24/2015  . Hypoglycemia 08/25/2014  . Nausea without vomiting 08/25/2014  . Dyslipidemia-statin ontol   . Long-term insulin use ([place]) 03/31/2014  . Aortic valve disorder 11/11/2013  . 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 Coronary Artery Disease s/p CABG 10/2013 10/10/2013  . Other malaise and fatigue 09/11/2013  . Alopecia 03/06/2013  . Insomnia 02/06/2013  . Gastroesophageal reflux disease without esophagitis 01/19/2013  . Dysphagia, unspecified(787.20) 01/19/2013  . Dyspnea 11/17/2012  . Weakness 11/17/2012  . Abnormal MRI of head 09/05/2012  . Depression with anxiety 03/21/2012  . Extrinsic asthma 11/12/2011  . Anemia 06/07/2011  . Chronic obstructive pulmonary disease ([place]) 01/25/2010  . Acquired hypothyroidism 08/10/2009  . MICROSCOPIC HEMATURIA 05/07/2009  . GOITER 03/02/2009  . Essential hypertension 01/06/2009  . MEMORY LOSS 06/23/2008  . Obstructive sleep apnea 12/25/2007  . Insulin dependent diabetes mellitus ([place]) 09/09/2006  . ALLERGIC RHINITIS 09/09/2006  . DIVERTICULOSIS, COLON 09/09/2006    Past Surgical History:  Procedure Laterality Date  . ABDOMINAL HYSTERECTOMY    . ABDOMINAL HYSTERECTOMY W/ PARTIAL VAGINACTOMY    . AORTIC VALVE REPLACEMENT N/A 10/12/2013   Procedure: AORTIC VALVE REPLACEMENT (AVR);  Surgeon: [name], MD;  Location: [facility];  Service: Open Heart Surgery;  Laterality: N/A;  . [place]  . BARTHOLIN GLAND CYST EXCISION    .  BLADDER SUSPENSION    . BREAST BIOPSY Bilateral 09/11/2000   neg  . BREAST BIOPSY Left 07/24/2010   neg  . BREAST CYST EXCISION  1988   bilateral nonmalignant tumors, x3  . CARDIAC CATHETERIZATION    . CARDIAC CATHETERIZATION N/A 05/25/2016   Procedure: Coronary Balloon Angioplasty;  Surgeon: Leonie Man, MD;  Location: [facility];  Service: Cardiovascular;  Laterality: N/A;  . CARDIAC CATHETERIZATION N/A 05/25/2016   Procedure: Coronary/Graft Angiography;  Surgeon: Leonie Man, MD;  Location: [facility];  Service: Cardiovascular;  Laterality: N/A;  . CATARACT EXTRACTION W/ INTRAOCULAR LENS  IMPLANT, BILATERAL    . CHOLECYSTECTOMY  2001  . COLECTOMY     lap sigmoid  . COLONOSCOPY  2014   polyps found, 2 clamped off.  . CORONARY ANGIOPLASTY  10/29/2007   Prox RCA & Mid Cx.  . CORONARY ARTERY BYPASS GRAFT N/A 10/12/2013   Procedure: CORONARY ARTERY BYPASS GRAFT TIMES TWO;  Surgeon: [name], MD;  Location: [facility];  Service: Open Heart Surgery;  Laterality: N/A;  . LEFT HEART CATHETERIZATION WITH CORONARY ANGIOGRAM N/A 10/09/2013   Procedure: LEFT HEART CATHETERIZATION WITH CORONARY ANGIOGRAM;  Surgeon: [name], MD;  Location: [facility] CATH LAB;  Service: Cardiovascular;  Laterality: N/A;  . STERNAL WIRES REMOVAL N/A 04/13/2014   Procedure: STERNAL WIRES REMOVAL;  Surgeon: [name], MD;  [facility];  Service: Thoracic;  Laterality: N/A;  . THYROIDECTOMY    . TONSILLECTOMY    . TUBAL LIGATION    . VAGINAL DELIVERY     3    Prior to Admission medications   Medication Sig Start Date End Date Taking? Authorizing Provider  albuterol (PROAIR HFA) 108 (90 Base) MCG/ACT inhaler Inhale 2 puffs into the lungs every 4 (four) hours as needed for wheezing or shortness of breath. 07/17/16   Flora Lipps, MD  aspirin EC 81 MG tablet Take 81 mg by mouth daily.    Historical Provider, MD  Calcium-Vitamin D (CALTRATE 600 PLUS-VIT D PO) Take 2 tablets by mouth 2 (two) times daily.     Historical Provider, MD  dimenhyDRINATE (DRAMAMINE) 50 MG tablet Take 50 mg by mouth every 6 (six) hours as needed.     Historical Provider, MD  esomeprazole (NEXIUM) 40 MG capsule Take 40 mg by mouth daily at 12 noon.     Historical Provider, MD  FLUoxetine (PROZAC) 40 MG capsule Take 40 mg by mouth 2 (two) times daily. 07/29/15   Historical Provider, MD  insulin NPH-regular Human (NOVOLIN 70/30) (70-30) 100 UNIT/ML injection Inject 70 Units into the skin 2 (two)  times daily.    Historical Provider, MD  levothyroxine (SYNTHROID, LEVOTHROID) 150 MCG tablet Take 150 mcg by mouth daily before breakfast.    Historical Provider, MD  nitroGLYCERIN (NITROSTAT) 0.4 MG SL tablet Place 0.4 mg under the tongue every 5 (five) minutes as needed for chest pain.    Historical Provider, MD  [facility] DELICA LANCETS 62I MISC USE TO CHECK BLOOD SUGAR THREE TIMES DAILY 05/17/14   Historical Provider, MD  prasugrel (EFFIENT) 10 MG TABS tablet Take 10 mg by mouth daily.    Historical Provider, MD  traZODone (DESYREL) 100 MG tablet TAKE TWO TABLETS BY MOUTH AT BEDTIME 08/15/15   Lucille Passy, MD    Allergies Amitriptyline; Benadryl [diphenhydramine]; Demerol [meperidine]; Gabapentin; Loratadine; Meperidine hcl; Mirtazapine; Olanzapine; Voltaren [diclofenac sodium]; Zetia [ezetimibe]; Ativan [lorazepam]; Atorvastatin; Budesonide-formoterol fumarate; Bupropion hcl; Caffeine;  Codeine sulfate; Lisinopril; Metformin; Mometasone furoate; Morphine sulfate; Other; Oxycodone-acetaminophen; Pioglitazone; Propoxyphene n-acetaminophen; Rosuvastatin; Shellfish allergy; Tramadol; Venlafaxine; Zolpidem tartrate; and Latex  Family History  Problem Relation Age of Onset  . Breast cancer Mother 66  . Hypertension Father   . Mesothelioma Father   . Asthma Father   . Stroke Paternal Grandfather   . Heart disease    . Breast cancer Maternal Aunt   . Breast cancer Paternal Aunt     Social History Social History  Substance Use Topics  . Smoking status: Former Smoker    Packs/day: 0.50    Years: 30.00    Types: Cigarettes    Quit date: 10/02/2013  . Smokeless tobacco: Never Used  . Alcohol use No    Review of Systems Constitutional: No fever/chills Eyes: No visual changes. ENT: No sore throat. Cardiovascular: Denies chest pain. Respiratory: Denies shortness of breath. Gastrointestinal: Positive for abdominal pain and bright red blood per rectum. Genitourinary: Negative for  dysuria. Musculoskeletal: Negative for back pain. Skin: Negative for rash. Neurological: Negative for headaches, focal weakness or numbness.  10-point ROS otherwise negative.  ____________________________________________   PHYSICAL EXAM:  VITAL SIGNS: ED Triage Vitals  Enc Vitals Group     BP 08/16/16 0605 133/69     Pulse Rate 08/16/16 0605 78     Resp 08/16/16 0605 18     Temp 08/16/16 0605 97.7 F (36.5 C)     Temp Source 08/16/16 0605 Oral     SpO2 08/16/16 0605 97 %     Weight 08/16/16 0605 193 lb (87.5 kg)     Height 08/16/16 0605 5\' 6"  (1.676 m)     Head Circumference --      Peak Flow --      Pain Score 08/16/16 0606 4     Pain Loc --      Pain Edu? --      Excl. in [place]? --     Constitutional: Alert and oriented. Apparent discomfort Eyes: Conjunctivae are normal. PERRL. EOMI. Head: Atraumatic. Mouth/Throat: Mucous membranes are moist. Oropharynx non-erythematous. Neck: No stridor.   Cardiovascular: Normal rate, regular rhythm. Good peripheral circulation. Grossly normal heart sounds. Respiratory: Normal respiratory effort.  No retractions. Lungs CTAB. Gastrointestinal: Left upper/left lower quadrant tenderness to palpation. No distention.  Musculoskeletal: No lower extremity tenderness nor edema. No gross deformities of extremities. Neurologic:  Normal speech and language. No gross focal neurologic deficits are appreciated.  Skin:  Skin is warm, dry and intact. No rash noted. Psychiatric: Mood and affect are normal. Speech and behavior are normal.  ____________________________________________   LABS (all labs ordered are listed, but only abnormal results are displayed)  Labs Reviewed  CBC - Abnormal; Notable for the following:       Result Value   WBC 15.6 (*)    Hemoglobin 11.4 (*)    HCT 34.6 (*)    MCV 79.9 (*)    RDW 15.5 (*)    All other components within normal limits  COMPREHENSIVE METABOLIC PANEL  PROTIME-INR  TYPE AND SCREEN     No  results found.    Procedures    INITIAL IMPRESSION / ASSESSMENT AND PLAN / ED COURSE  Pertinent labs & imaging results that were available during my care of the patient were reviewed by me and considered in my medical decision making (see chart for details).  Patient given fentanyl and Zofran for pain and nausea respectively with improvement of pain score 4 out of 10. Patient  however continues to have persistent nausea. CT scan of the abdomen and pelvis pending at this time. Concern for possible diverticulitis. Plan to repeat the patient's hemoglobin Patient's care transferred to Dr. Joni Fears       ____________________________________________  FINAL CLINICAL IMPRESSION(S) / ED DIAGNOSES  Final diagnoses:  Lower GI bleed     MEDICATIONS GIVEN DURING THIS VISIT:  Medications  fentaNYL (SUBLIMAZE) injection 12.5 mcg (12.5 mcg Intravenous Given 08/16/16 0638)  sodium chloride 0.9 % bolus 1,000 mL (1,000 mLs Intravenous New Bag/Given 08/16/16 0639)  iopamidol (ISOVUE-300) 61 % injection 30 mL (30 mLs Oral Contrast Given 08/16/16 0655)     NEW OUTPATIENT MEDICATIONS STARTED DURING THIS VISIT:  New Prescriptions   No medications on file    Modified Medications   No medications on file    Discontinued Medications   No medications on file     Note:  This document was prepared using Dragon voice recognition software and may include unintentional dictation errors.    Gregor Hams, MD 08/17/16 [phone]

## 2016-08-16 NOTE — ED Triage Notes (Signed)
Pt presents to ED with nausea, diarrhea, and rectal bleeding since around 0330 am; large clots and "bright red blood" noted by pt and her husband at home. Pt reports she was feeling constipated last week so she took Senakot, Doculax, and an enema to help her pass a large amount of hard stool.

## 2016-08-16 NOTE — Progress Notes (Signed)
Family Meeting Note  Advance Directive:yes  Today a meeting took place with the Patient, spouse at bed side   The following clinical team members were present during this meeting:MD  The following were discussed:Patient's diagnosis: Acute diverticulitis and the plan of care was discussed in detail with the patient and her spouse at bedside, Patient's progosis: Unable to determine and Goals for treatment: Full Code, husband is the healthcare power of attorney  Additional follow-up to be provided: Hospitalist, gastrin to urology and general surgery  Time spent during discussion:18 min  [name], Megan Silver, MD

## 2016-08-16 NOTE — Consult Note (Signed)
PULMONARY / CRITICAL CARE MEDICINE   Name: [name] MRN: [id number] DOB: [birth year]    ADMISSION DATE:  08/16/2016 CONSULTATION DATE: 08/16/16  REFERRING MD:  Dr. Margaretmary Eddy  CHIEF COMPLAINT:  Lower GI bleed  HISTORY OF PRESENT ILLNESS:   [name] is a 71 yo female with COPD,CAD, Diabetes Melitus, GERD and Thyroid disease. Patient presented to [facility] with one day Hx of left lower quadrant abdominal pain with rectal bleeding CT of the abdomen and pelvis was concerning for uncomplicated diverticulitis at the junction of the descending colon and sigmoid colon.  Her Hgb dropped from 11.4 to 7.2 after several bowel movements. She underwent a tagged red cell scan which was active for GI bleeding in the left lower quadrant. She underwent coil embolization of the left colic branch and 2 sigmoidal  Branches of the IMA.  Post procedure she was placed in the ICU for observation and PCCM team consulted for further management. PAST MEDICAL HISTORY :  She  has a past medical history of ACE-inhibitor cough; Allergic rhinitis; Anemia; Anxiety; Arthritis; Asthma; Cataract; Chronic depression; Chronic headache; COPD (chronic obstructive pulmonary disease) ([place]); Coronary artery disease; Diabetes mellitus; Diverticulitis of colon; GERD (gastroesophageal reflux disease); H/O aortic valve replacement; Hearing loss; History of blood transfusion (2013); HOH (hard of hearing); Hypercholesterolemia; OSA (obstructive sleep apnea); PONV (postoperative nausea and vomiting); and Thyroid disease.  PAST SURGICAL HISTORY: She  has a past surgical history that includes Cholecystectomy (2001); Colectomy; Tonsillectomy; Bartholin gland cyst excision; Tubal ligation; Bladder suspension; Breast cyst excision (1988); Vaginal delivery; Thyroidectomy; Abdominal hysterectomy w/ partial vaginactomy; Appendectomy (1964); Colonoscopy (2014); Cardiac catheterization; Coronary angioplasty (10/29/2007); Coronary artery bypass graft (N/A,  10/12/2013); Aortic valve replacement (N/A, 10/12/2013); Cataract extraction w/ intraocular lens  implant, bilateral; Abdominal hysterectomy; Sternal wires removal (N/A, 04/13/2014); left heart catheterization with coronary angiogram (N/A, 10/09/2013); Breast biopsy (Bilateral, 09/11/2000); Breast biopsy (Left, 07/24/2010); Cardiac catheterization (N/A, 05/25/2016); and Cardiac catheterization (N/A, 05/25/2016).  Allergies  Allergen Reactions  . Amitriptyline Other (See Comments)    Unknown reaction  . Benadryl [Diphenhydramine] Shortness Of Breath  . Demerol [Meperidine] Other (See Comments)    Unknown reaction  . Gabapentin Other (See Comments)    Unknown reaction  . Loratadine Other (See Comments)    Unknown reaction  . Meperidine Hcl Other (See Comments)    Unknown reaction  . Mirtazapine Other (See Comments)    Unknown reaction  . Olanzapine Other (See Comments)    Unknown reaction   . Voltaren [Diclofenac Sodium] Shortness Of Breath  . Zetia [Ezetimibe] Other (See Comments)    Weakness in legs, shakiness all over  . Ativan [Lorazepam] Other (See Comments)    Causes double vision at highter than .5 mg dose  . Atorvastatin Other (See Comments)    Muscle aches and weakness  . Budesonide-Formoterol Fumarate Other (See Comments)    Shakiness, tremors  . Bupropion Hcl Other (See Comments)    "cloud over me" depression  . Caffeine Other (See Comments)    jitters  . Codeine Sulfate Other (See Comments)    Makes chest hurt like a heart attack  . Lisinopril Cough  . Metformin Nausea And Vomiting  . Mometasone Furoate Nausea And Vomiting  . Morphine Sulfate Other (See Comments)    Chest pain like a heart attack  . Other Other (See Comments)    Beta Blockers, reaction shortness of breath  . Oxycodone-Acetaminophen Nausea And Vomiting  . Pioglitazone Other (See Comments)    Cannot take because  of risk of bladder cancer  . Propoxyphene N-Acetaminophen Nausea And Vomiting  .  Rosuvastatin Other (See Comments)    Muscle aches and weakness  . Shellfish Allergy Diarrhea  . Tramadol Nausea Only  . Venlafaxine Other (See Comments)    Unknown reaction  . Zolpidem Tartrate Other (See Comments)     Jittery, diarrhea  . Latex Rash    No current facility-administered medications on file prior to encounter.    Current Outpatient Prescriptions on File Prior to Encounter  Medication Sig  . aspirin EC 81 MG tablet Take 81 mg by mouth daily.  . Calcium-Vitamin D (CALTRATE 600 PLUS-VIT D PO) Take 2 tablets by mouth 2 (two) times daily.   Marland Kitchen esomeprazole (NEXIUM) 40 MG capsule Take 40 mg by mouth daily at 12 noon.   Marland Kitchen FLUoxetine (PROZAC) 40 MG capsule Take 40 mg by mouth 2 (two) times daily.  . insulin NPH-regular Human (NOVOLIN 70/30) (70-30) 100 UNIT/ML injection Inject 70 Units into the skin 2 (two) times daily.  Marland Kitchen levothyroxine (SYNTHROID, LEVOTHROID) 150 MCG tablet Take 150 mcg by mouth daily before breakfast.  . prasugrel (EFFIENT) 10 MG TABS tablet Take 10 mg by mouth daily.  . traZODone (DESYREL) 100 MG tablet TAKE TWO TABLETS BY MOUTH AT BEDTIME  . albuterol (PROAIR HFA) 108 (90 Base) MCG/ACT inhaler Inhale 2 puffs into the lungs every 4 (four) hours as needed for wheezing or shortness of breath.  . dimenhyDRINATE (DRAMAMINE) 50 MG tablet Take 50 mg by mouth every 6 (six) hours as needed.   . nitroGLYCERIN (NITROSTAT) 0.4 MG SL tablet Place 0.4 mg under the tongue every 5 (five) minutes as needed for chest pain.  [name] DELICA LANCETS 60Y MISC USE TO CHECK BLOOD SUGAR THREE TIMES DAILY    FAMILY HISTORY:  Her @FAMSTP (<SUBSCRIPT> error)@  SOCIAL HISTORY: She  reports that she quit smoking about 2 years ago. Her smoking use included Cigarettes. She has a 15.00 pack-year smoking history. She has never used smokeless tobacco. She reports that she does not drink alcohol or use drugs.  REVIEW OF SYSTEMS:   Unable to obtain as the patient still remains  drowsy  SUBJECTIVE:  Unable to obtain as the patient still remains drowsy  VITAL SIGNS: BP 105/60   Pulse (!) 104   Temp 97.8 F (36.6 C) (Oral)   Resp 17   Ht 5\' 6"  (1.676 m)   Wt 87.6 kg (193 lb 2 oz)   SpO2 96%   BMI 31.17 kg/m   HEMODYNAMICS:    VENTILATOR SETTINGS:    INTAKE / OUTPUT: I/O last 3 completed shifts: In: 1540 [I.V.:200; IV Piggyback:1340] Out: -   PHYSICAL EXAMINATION: General:  Elderly, white female, on [place], No acute distress Neuro:  Drowsy but arousable HEENT:  AT,[place],No JVD Cardiovascular: S1S2,Regular,No m/r/g Lungs: Clear bilaterally, no wheezes,crackles, rhonchi Abdomen: obese , round, tender to light palpation Musculoskeletal:  No inflammation/deformity, active ROM Skin:  Warm, dry and intact  LABS:  BMET  Recent Labs Lab 08/16/16 0632  NA 135  K 4.2  CL 102  CO2 25  BUN 18  CREATININE 0.80  GLUCOSE 243*    Electrolytes  Recent Labs Lab 08/16/16 0632  CALCIUM 8.2*    CBC  Recent Labs Lab 08/16/16 0632 08/16/16 0906 08/16/16 1127 08/16/16 1548  WBC 15.6* 12.9*  --   --   HGB 11.4* 9.7* 9.8* 7.2*  HCT 34.6* 29.5* 29.5*  --   PLT 185 160  --   --  Coag's  Recent Labs Lab 08/16/16 [phone]  INR 1.13    Sepsis Markers No results for input(s): LATICACIDVEN, PROCALCITON, O2SATVEN in the last 168 hours.  ABG No results for input(s): PHART, PCO2ART, PO2ART in the last 168 hours.  Liver Enzymes  Recent Labs Lab 08/16/16 0632  AST 17  ALT 13*  ALKPHOS 83  BILITOT 0.5  ALBUMIN 3.2*    Cardiac Enzymes No results for input(s): TROPONINI, PROBNP in the last 168 hours.  Glucose  Recent Labs Lab 08/16/16 1138 08/16/16 1827  GLUCAP 178* 355*    Imaging Nm Gi Blood Loss  Result Date: 08/16/2016 CLINICAL DATA:  Lower GI bleed. EXAM: NUCLEAR MEDICINE GASTROINTESTINAL BLEEDING SCAN TECHNIQUE: Sequential abdominal images were obtained following intravenous administration of Tc-43m labeled red blood  cells. RADIOPHARMACEUTICALS:  23.6 mCi Tc-24m in-vitro labeled red cells. COMPARISON:  None. FINDINGS: On the early images there is a 8 tubular shaped focus of increased radiotracer uptake in the left lower quadrant of the abdomen. This increases in intensity over time in there is antegrade progression of the radiotracer conforming to the expected location of the sigmoid colon. IMPRESSION: 1. Examination is positive for active GI bleeding within the left lower quadrant of the abdomen and is likely at the level of the distal descending colon/proximal sigmoid colon. Electronically Signed   By: Kerby Moors M.D.   On: 08/16/2016 17:22   Ct Abdomen Pelvis W Contrast  Result Date: 08/16/2016 CLINICAL DATA:  Left lower quadrant pain. Rectal bleeding. History of diverticulitis. EXAM: CT ABDOMEN AND PELVIS WITH CONTRAST TECHNIQUE: Multidetector CT imaging of the abdomen and pelvis was performed using the standard protocol following bolus administration of intravenous contrast. CONTRAST:  127mL ISOVUE-300 IOPAMIDOL (ISOVUE-300) INJECTION 61% COMPARISON:  None. FINDINGS: Lower chest:  Aortic valve replacement.  No acute finding Hepatobiliary: No focal liver abnormality.Cholecystectomy with normal common bile duct diameter Pancreas: Unremarkable. Spleen: Low-density following the lateral capsule, likely from remote injury. No acute finding. Adrenals/Urinary Tract: Negative adrenals. No hydronephrosis or stone. Unremarkable bladder. Stomach/Bowel: Fat stranding around a descending sigmoid junction diverticulum. Patient is status post sigmoidectomy. Appendix not identified. No pericecal inflammation. No bowel obstruction. Vascular/Lymphatic: Extensive atherosclerosis. Diffuse irregular plaque and intimal distortion along the aorta. No acute vascular finding. Major mesenteric vessels are patent. No mass or adenopathy. Reproductive:Hysterectomy. Other: No ascites or pneumoperitoneum. Musculoskeletal: Degenerative changes  without acute finding. IMPRESSION: 1. Uncomplicated diverticulitis at the descending sigmoid junction. 2. Advanced atherosclerosis and other chronic findings described above. Electronically Signed   By: Monte Fantasia M.D.   On: 08/16/2016 07:45     STUDIES:  08/16/16 CT abdomen and pelvis>>Uncomplicated diverticulitis at the descending sigmoid junction.   CULTURES: None  ANTIBIOTICS: 3/18 Zosyn X1 3/18 Cefazolin X1 3/18 Levaquin X1 3/18 Flagyl>>  SIGNIFICANT EVENTS: 3/18 >> Patient underwent coil embolization of the left colic branch and 2 sigmoidal branches of the IMA and post operatively is monitored in the ICU  LINES/TUBES: None   ASSESSMENT / PLAN:  PULMONARY A: Hx of COPD P:   O2 as needed Albuterol PRN  CARDIOVASCULAR A:  Hx Of CAD- s/p CABG P:  Continuous Telemetry Keep MAP goal>65  RENAL A:   No active issues P:   Replace electrolytes per usual guidelines  GASTROINTESTINAL A:  Acute lower abdominal pain with lower GI bleed secondary to acute diverticulitis Lower GI Bleeding - s/p coil embolization of the left colic branch and 2 sigmoidal branches of the IMA GERD Nausea P:   NPO for now,  will advance diet when more stable. General surgery consulted Continue Protonix Pain management as needed HEMATOLOGIC A:   Anemia due to Blood loss P:  prbc's 4 Unit transfused- 3/15 Trend H/H Zofran  INFECTIOUS A:    acute diverticulitis P:   Antibiotics per primary Monitor fever,CBC ENDOCRINE A:   Diabetes Melitus Hypothyroidism P:   Continue Synthroid Blood Glucose checks with BMP  NEUROLOGIC A:   Drowsy post operatively P:   Will provide supportive care Minimize sedating drugs     Halayna Blane,AG-ACNP Pulmonary and [place]   08/16/2016, 8:21 PM

## 2016-08-16 NOTE — Progress Notes (Signed)
Pharmacy Antibiotic Note  [name] is a 71 y.o. female admitted on 08/16/2016 with acute diverticulitis.  Pharmacy has been consulted for Levaquin dosing. Patient also ordered Flagyl 500mg  IV q8h. Patient is currently NPO  Plan: Levaquin 750mg  IV q24h  Height: 5\' 6"  (167.6 cm) Weight: 193 lb (87.5 kg) IBW/kg (Calculated) : 59.3  Temp (24hrs), Avg:97.7 F (36.5 C), Min:97.7 F (36.5 C), Max:97.7 F (36.5 C)   Recent Labs Lab 08/16/16 0632 08/16/16 0906  WBC 15.6* 12.9*  CREATININE 0.80  --     Estimated Creatinine Clearance: 71.9 mL/min (by C-G formula based on SCr of 0.8 mg/dL).    Allergies  Allergen Reactions  . Amitriptyline Other (See Comments)    Unknown reaction  . Benadryl [Diphenhydramine] Shortness Of Breath  . Demerol [Meperidine] Other (See Comments)    Unknown reaction  . Gabapentin Other (See Comments)    Unknown reaction  . Loratadine Other (See Comments)    Unknown reaction  . Meperidine Hcl Other (See Comments)    Unknown reaction  . Mirtazapine Other (See Comments)    Unknown reaction  . Olanzapine Other (See Comments)    Unknown reaction   . Voltaren [Diclofenac Sodium] Shortness Of Breath  . Zetia [Ezetimibe] Other (See Comments)    Weakness in legs, shakiness all over  . Ativan [Lorazepam] Other (See Comments)    Causes double vision at highter than .5 mg dose  . Atorvastatin Other (See Comments)    Muscle aches and weakness  . Budesonide-Formoterol Fumarate Other (See Comments)    Shakiness, tremors  . Bupropion Hcl Other (See Comments)    "cloud over me" depression  . Caffeine Other (See Comments)    jitters  . Codeine Sulfate Other (See Comments)    Makes chest hurt like a heart attack  . Lisinopril Cough  . Metformin Nausea And Vomiting  . Mometasone Furoate Nausea And Vomiting  . Morphine Sulfate Other (See Comments)    Chest pain like a heart attack  . Other Other (See Comments)    Beta Blockers, reaction shortness of  breath  . Oxycodone-Acetaminophen Nausea And Vomiting  . Pioglitazone Other (See Comments)    Cannot take because of risk of bladder cancer  . Propoxyphene N-Acetaminophen Nausea And Vomiting  . Rosuvastatin Other (See Comments)    Muscle aches and weakness  . Shellfish Allergy Diarrhea  . Tramadol Nausea Only  . Venlafaxine Other (See Comments)    Unknown reaction  . Zolpidem Tartrate Other (See Comments)     Jittery, diarrhea  . Latex Rash    Antimicrobials this admission: Flagyl 3/15 >>  Levaquin 3/15 >>   Thank you for allowing pharmacy to be a part of this patient's care.  Paulina Fusi, PharmD, BCPS 08/16/2016 11:31 AM

## 2016-08-16 NOTE — Progress Notes (Addendum)
Pt escorted to radiology for bleeding scan. Pt current VS at 1616 are temp-99.7 oral, BP 102/54 manually, pulse 62, resp. 16, 02 97 on RA. Pt is currently stating " please do not let me die". Pt.'s color is very pale, RN has changed pt's brief  5 times with massive blood clots and bright red blood, prime doctor notified and Dr. Vicente Males was shown pt.'s most recent bowel movement. Once pt was in radiology, Hgb result were back at 1548 and was 7.2. Orders to give pt a blood transfusion, with pt being in radiology RN went down to radiology to start blood transfusion, Hoyle Sauer, RN was Therapist, music.  Pt was not awake enough to sign consent, husband signed consent. For the first 15 minutes after blood transfusion was started VS were T-98 axillary, BP 82/58, P-86, O2-98 on 2L.Dr. Lucky Cowboy was at bed side for the first 15 minute VS check.  RN stayed with pt for 30 minutes total after blood transfusion was started, pt was immediately transferred to begin vascular procedure. Orders were placed for pt to go to ICU bed 15. Report given to Romie Minus, RN.   Andreya Lacks [employer]

## 2016-08-17 ENCOUNTER — Encounter: Payer: Self-pay | Admitting: Vascular Surgery

## 2016-08-17 ENCOUNTER — Inpatient Hospital Stay: Payer: Medicare Other

## 2016-08-17 DIAGNOSIS — K922 Gastrointestinal hemorrhage, unspecified: Secondary | ICD-10-CM

## 2016-08-17 LAB — CBC
HCT: 36.1 % (ref 35.0–47.0)
HEMATOCRIT: 34.7 % — AB (ref 35.0–47.0)
Hemoglobin: 12.1 g/dL (ref 12.0–16.0)
Hemoglobin: 11.5 g/dL — ABNORMAL LOW (ref 12.0–16.0)
MCH: 28 pg (ref 26.0–34.0)
MCH: 27.7 pg (ref 26.0–34.0)
MCHC: 33.5 g/dL (ref 32.0–36.0)
MCHC: 33.2 g/dL (ref 32.0–36.0)
MCV: 83.6 fL (ref 80.0–100.0)
MCV: 83.6 fL (ref 80.0–100.0)
PLATELETS: 148 10*3/uL — AB (ref 150–440)
Platelets: 165 10*3/uL (ref 150–440)
RBC: 4.32 MIL/uL (ref 3.80–5.20)
RBC: 4.15 MIL/uL (ref 3.80–5.20)
RDW: 15.3 % — ABNORMAL HIGH (ref 11.5–14.5)
RDW: 15.7 % — AB (ref 11.5–14.5)
WBC: 19.1 10*3/uL — AB (ref 3.6–11.0)
WBC: 26.8 10*3/uL — AB (ref 3.6–11.0)

## 2016-08-17 LAB — COMPREHENSIVE METABOLIC PANEL
ALT: 14 U/L (ref 14–54)
AST: 25 U/L (ref 15–41)
Albumin: 2.7 g/dL — ABNORMAL LOW (ref 3.5–5.0)
Alkaline Phosphatase: 64 U/L (ref 38–126)
Anion gap: 6 (ref 5–15)
BUN: 24 mg/dL — ABNORMAL HIGH (ref 6–20)
CO2: 23 mmol/L (ref 22–32)
Calcium: 6.8 mg/dL — ABNORMAL LOW (ref 8.9–10.3)
Chloride: 103 mmol/L (ref 101–111)
Creatinine, Ser: 1.06 mg/dL — ABNORMAL HIGH (ref 0.44–1.00)
GFR calc Af Amer: 60 mL/min — ABNORMAL LOW (ref >60)
GFR calc non Af Amer: 52 mL/min — ABNORMAL LOW (ref >60)
Glucose, Bld: 364 mg/dL — ABNORMAL HIGH (ref 65–99)
Potassium: 4.8 mmol/L (ref 3.5–5.1)
Sodium: 132 mmol/L — ABNORMAL LOW (ref 135–145)
Total Bilirubin: 0.6 mg/dL (ref 0.3–1.2)
Total Protein: 5.5 g/dL — ABNORMAL LOW (ref 6.5–8.1)

## 2016-08-17 LAB — GLUCOSE, CAPILLARY
GLUCOSE-CAPILLARY: 207 mg/dL — AB (ref 65–99)
GLUCOSE-CAPILLARY: 193 mg/dL — AB (ref 65–99)
Glucose-Capillary: 354 mg/dL — ABNORMAL HIGH (ref 65–99)
Glucose-Capillary: 302 mg/dL — ABNORMAL HIGH (ref 65–99)
Glucose-Capillary: 182 mg/dL — ABNORMAL HIGH (ref 65–99)

## 2016-08-17 LAB — PREPARE RBC (CROSSMATCH)

## 2016-08-17 LAB — HEMOGLOBIN AND HEMATOCRIT, BLOOD
HCT: 36.4 % (ref 35.0–47.0)
HEMATOCRIT: 32.5 % — AB (ref 35.0–47.0)
Hemoglobin: 12.2 g/dL (ref 12.0–16.0)
Hemoglobin: 10.9 g/dL — ABNORMAL LOW (ref 12.0–16.0)

## 2016-08-17 MED ORDER — INSULIN DETEMIR 100 UNIT/ML ~~LOC~~ SOLN
25.0000 [IU] | Freq: Two times a day (BID) | SUBCUTANEOUS | Status: DC
Start: 2016-08-17 — End: 2016-08-18
  Administered 2016-08-17 – 2016-08-18 (×3): 25 [IU] via SUBCUTANEOUS
  Filled 2016-08-17 (×5): qty 0.25

## 2016-08-17 MED ORDER — HYDROMORPHONE HCL 1 MG/ML IJ SOLN
0.5000 mg | INTRAMUSCULAR | Status: DC | PRN
  Administered 2016-08-17: 2 mg via INTRAVENOUS
  Filled 2016-08-17: qty 2

## 2016-08-17 MED ORDER — HYDROMORPHONE HCL 1 MG/ML IJ SOLN
1.0000 mg | INTRAMUSCULAR | Status: DC | PRN
  Administered 2016-08-18 – 2016-08-21 (×14): 1 mg via INTRAVENOUS
  Filled 2016-08-17 (×2): qty 1
  Filled 2016-08-17: qty 2
  Filled 2016-08-17 (×13): qty 1

## 2016-08-17 MED ORDER — HYDROMORPHONE HCL 1 MG/ML IJ SOLN
2.0000 mg | INTRAMUSCULAR | Status: DC | PRN
  Administered 2016-08-17 (×3): 2 mg via INTRAVENOUS
  Filled 2016-08-17 (×3): qty 2

## 2016-08-17 MED ORDER — MENTHOL 3 MG MT LOZG: 1.0000 | LOZENGE | OROMUCOSAL | Status: DC | PRN

## 2016-08-17 MED ORDER — INSULIN ASPART 100 UNIT/ML ~~LOC~~ SOLN
0.0000 [IU] | Freq: Three times a day (TID) | SUBCUTANEOUS | Status: DC
Start: 2016-08-17 — End: 2016-08-18
  Administered 2016-08-17: 5 [IU] via SUBCUTANEOUS
  Administered 2016-08-17: 11 [IU] via SUBCUTANEOUS
  Administered 2016-08-18: 3 [IU] via SUBCUTANEOUS
  Administered 2016-08-18: 2 [IU] via SUBCUTANEOUS
  Filled 2016-08-17: qty 2
  Filled 2016-08-17: qty 11
  Filled 2016-08-17: qty 5
  Filled 2016-08-17: qty 3

## 2016-08-17 MED ORDER — HYDROMORPHONE HCL 1 MG/ML IJ SOLN
1.0000 mg | Freq: Once | INTRAMUSCULAR | Status: AC
  Administered 2016-08-17: 1 mg via INTRAVENOUS

## 2016-08-17 NOTE — Plan of Care (Signed)
Problem: Skin Integrity: Goal: Risk for impaired skin integrity will decrease Outcome: Progressing Patient is able to move about the bed independently and has been educated to turn every two hours to prevent pressure injuries.

## 2016-08-17 NOTE — Progress Notes (Signed)
Four PRBC transfusions have been completed. Lab has been notified to check Hemoglobin and Hematocrit at 0700.

## 2016-08-17 NOTE — Care Management (Signed)
Transferred to icu stepdown after embolization of bleeding site. Hgb stable after initial transfusion.

## 2016-08-17 NOTE — Progress Notes (Signed)
[facility]  Daily Progress Note   Subjective  - 1 Day Post-Op  Patient with two bloody BM overnight, but much better than before.  Complains of lower abdominal pain.  Had some on admission.  Stable BP overnight.    Objective Vitals:   08/17/16 0645 08/17/16 0700 08/17/16 0715 08/17/16 0730  BP: 138/73 134/78 131/75   Pulse: (!) 108 (!) 108 (!) 108 (!) 107  Resp: 18 18 16 17   Temp:      TempSrc:      SpO2: 95% 95% 95% 95%  Weight:      Height:        Intake/Output Summary (Last 24 hours) at 08/17/16 0850 Last data filed at 08/17/16 0612  Gross per 24 hour  Intake             3260 ml  Output              200 ml  Net             3060 ml    PULM  CTAB CV  RRR VASC  Access site C/D/I  Laboratory CBC    Component Value Date/Time   WBC 19.1 (H) 08/17/2016 0722   HGB 12.1 08/17/2016 0722   HGB 12.4 03/31/2014 1417   HCT 36.1 08/17/2016 0722   HCT 35.4 06/07/2016 0957   PLT 148 (L) 08/17/2016 0722   PLT 210 06/07/2016 0957    BMET    Component Value Date/Time   NA 132 (L) 08/17/2016 0722   NA 138 06/07/2016 0957   NA 137 12/22/2013 1544   K 4.8 08/17/2016 0722   K 4.0 12/22/2013 1544   CL 103 08/17/2016 0722   CL 106 12/22/2013 1544   CO2 23 08/17/2016 0722   CO2 24 12/22/2013 1544   GLUCOSE 364 (H) 08/17/2016 0722   GLUCOSE 184 (H) 12/22/2013 1544   BUN 24 (H) 08/17/2016 0722   BUN 15 06/07/2016 0957   BUN 15 12/22/2013 1544   CREATININE 1.06 (H) 08/17/2016 0722   CREATININE 0.90 12/22/2013 1544   CREATININE 0.67 08/30/2011 1120   CALCIUM 6.8 (L) 08/17/2016 0722   CALCIUM 7.9 (L) 12/22/2013 1544   GFRNONAA 52 (L) 08/17/2016 0722   GFRNONAA >60 12/22/2013 1544   GFRAA 60 (L) 08/17/2016 0722   GFRAA >60 12/22/2013 1544    Assessment/Planning: POD #1 s/p IMA embolization for lower GI bleed   Hgb 12.1. No signs of significant further bleeding  Renal function stable. BP better.  Likely just clearing out old blood in the  colon at this point with no or minimal ongoing bleeding  Overall doing well.  If she has significant recurrent bleeding or other issues, no role for repeat embolization.  Would have to consider surgery.  No need for surgery at this point though  Will sign off, please call with questions.    Leotis Pain  08/17/2016, 8:50 AM

## 2016-08-17 NOTE — Progress Notes (Signed)
Pharmacy Antibiotic Note  [name] is a 71 y.o. female admitted on 08/16/2016 with acute diverticulitis.  Pharmacy has been consulted for Levaquin dosing. Patient also ordered Flagyl 500mg  IV q8h. Patient is currently NPO  Plan: Levaquin 750mg  IV q24h  Height: 5\' 6"  (167.6 cm) Weight: 193 lb 2 oz (87.6 kg) IBW/kg (Calculated) : 59.3  Temp (24hrs), Avg:98.2 F (36.8 C), Min:97.5 F (36.4 C), Max:99 F (37.2 C)   Recent Labs Lab 08/16/16 0632 08/16/16 0906 08/17/16 0722 08/17/16 1544  WBC 15.6* 12.9* 19.1* 26.8*  CREATININE 0.80  --  1.06*  --     Estimated Creatinine Clearance: 54.3 mL/min (A) (by C-G formula based on SCr of 1.06 mg/dL (H)).    Allergies  Allergen Reactions  . Amitriptyline Other (See Comments)    Unknown reaction  . Benadryl [Diphenhydramine] Shortness Of Breath  . Demerol [Meperidine] Other (See Comments)    Unknown reaction  . Gabapentin Other (See Comments)    Unknown reaction  . Loratadine Other (See Comments)    Unknown reaction  . Meperidine Hcl Other (See Comments)    Unknown reaction  . Mirtazapine Other (See Comments)    Unknown reaction  . Olanzapine Other (See Comments)    Unknown reaction   . Voltaren [Diclofenac Sodium] Shortness Of Breath  . Zetia [Ezetimibe] Other (See Comments)    Weakness in legs, shakiness all over  . Ativan [Lorazepam] Other (See Comments)    Causes double vision at highter than .5 mg dose  . Atorvastatin Other (See Comments)    Muscle aches and weakness  . Budesonide-Formoterol Fumarate Other (See Comments)    Shakiness, tremors  . Bupropion Hcl Other (See Comments)    "cloud over me" depression  . Caffeine Other (See Comments)    jitters  . Codeine Sulfate Other (See Comments)    Makes chest hurt like a heart attack  . Lisinopril Cough  . Metformin Nausea And Vomiting  . Mometasone Furoate Nausea And Vomiting  . Morphine Sulfate Other (See Comments)    Chest pain like a heart attack  . Other  Other (See Comments)    Beta Blockers, reaction shortness of breath  . Oxycodone-Acetaminophen Nausea And Vomiting  . Pioglitazone Other (See Comments)    Cannot take because of risk of bladder cancer  . Propoxyphene N-Acetaminophen Nausea And Vomiting  . Rosuvastatin Other (See Comments)    Muscle aches and weakness  . Shellfish Allergy Diarrhea  . Tramadol Nausea Only  . Venlafaxine Other (See Comments)    Unknown reaction  . Zolpidem Tartrate Other (See Comments)     Jittery, diarrhea  . Latex Rash    Antimicrobials this admission: Metronidazole 3/15 >>  Levofloxacin 3/15 >>   Pharmacy will continue to monitor and adjust per consult.  MLS 08/17/2016 4:40 PM

## 2016-08-17 NOTE — Progress Notes (Signed)
[place] NP made aware of a large bloody bowel movement full of clots. Will continue to monitor the patient and see what pt Hemoglobin is post current blood transfusions.

## 2016-08-17 NOTE — Progress Notes (Signed)
Jonathon Bellows MD [address], [place], [place] [address] Phone: [phone] Fax : [phone]  [name] is being followed for diverticulitis with diverticular bleed  Day 2 of follow up   Subjective: Underwent IR guided embolization of bleeding vessels yesterday , 1 large bloody bowel movement last night , none since , lower abdominal pain    Objective: Vital signs in last 24 hours: Vitals:   08/17/16 0545 08/17/16 0600 08/17/16 0612 08/17/16 0615  BP: 112/67 129/70 138/70 138/70  Pulse: (!) 111 (!) 111 (!) 113 (!) 109  Resp: 17 16 15 18   Temp:   98.9 F (37.2 C)   TempSrc:   Oral   SpO2: 95% 96% 96% 95%  Weight:      Height:       Weight change: 2 oz (0.056 kg)  Intake/Output Summary (Last 24 hours) at 08/17/16 0720 Last data filed at 08/17/16 6962  Gross per 24 hour  Intake             4260 ml  Output              200 ml  Net             4060 ml     Exam: Heart:: Regular rate and rhythm, S1S2 present or without murmur or extra heart sounds Lungs: normal, clear to auscultation and clear to auscultation and percussion Abdomen: soft, nontender, normal bowel sounds   Lab Results: @LABTEST2 @ Micro Results: Recent Results (from the past 240 hour(s))  MRSA PCR Screening     Status: None   Collection Time: 08/16/16  6:40 PM  Result Value Ref Range Status   MRSA by PCR NEGATIVE NEGATIVE Final    Comment:        The GeneXpert MRSA Assay (FDA approved for NASAL specimens only), is one component of a comprehensive MRSA colonization surveillance program. It is not intended to diagnose MRSA infection nor to guide or monitor treatment for MRSA infections.    Studies/Results: Nm Gi Blood Loss  Result Date: 08/16/2016 CLINICAL DATA:  Lower GI bleed. EXAM: NUCLEAR MEDICINE GASTROINTESTINAL BLEEDING SCAN TECHNIQUE: Sequential abdominal images were obtained following intravenous administration of Tc-6m labeled red blood cells. RADIOPHARMACEUTICALS:  23.6  mCi Tc-42m in-vitro labeled red cells. COMPARISON:  None. FINDINGS: On the early images there is a 8 tubular shaped focus of increased radiotracer uptake in the left lower quadrant of the abdomen. This increases in intensity over time in there is antegrade progression of the radiotracer conforming to the expected location of the sigmoid colon. IMPRESSION: 1. Examination is positive for active GI bleeding within the left lower quadrant of the abdomen and is likely at the level of the distal descending colon/proximal sigmoid colon. Electronically Signed   By: Kerby Moors M.D.   On: 08/16/2016 17:22   Ct Abdomen Pelvis W Contrast  Result Date: 08/16/2016 CLINICAL DATA:  Left lower quadrant pain. Rectal bleeding. History of diverticulitis. EXAM: CT ABDOMEN AND PELVIS WITH CONTRAST TECHNIQUE: Multidetector CT imaging of the abdomen and pelvis was performed using the standard protocol following bolus administration of intravenous contrast. CONTRAST:  158mL ISOVUE-300 IOPAMIDOL (ISOVUE-300) INJECTION 61% COMPARISON:  None. FINDINGS: Lower chest:  Aortic valve replacement.  No acute finding Hepatobiliary: No focal liver abnormality.Cholecystectomy with normal common bile duct diameter Pancreas: Unremarkable. Spleen: Low-density following the lateral capsule, likely from remote injury. No acute finding. Adrenals/Urinary Tract: Negative adrenals. No hydronephrosis or stone. Unremarkable bladder. Stomach/Bowel: Fat stranding around a  descending sigmoid junction diverticulum. Patient is status post sigmoidectomy. Appendix not identified. No pericecal inflammation. No bowel obstruction. Vascular/Lymphatic: Extensive atherosclerosis. Diffuse irregular plaque and intimal distortion along the aorta. No acute vascular finding. Major mesenteric vessels are patent. No mass or adenopathy. Reproductive:Hysterectomy. Other: No ascites or pneumoperitoneum. Musculoskeletal: Degenerative changes without acute finding. IMPRESSION: 1.  Uncomplicated diverticulitis at the descending sigmoid junction. 2. Advanced atherosclerosis and other chronic findings described above. Electronically Signed   By: Monte Fantasia M.D.   On: 08/16/2016 07:45   Medications: I have reviewed the patient's current medications. Scheduled Meds: . FLUoxetine  40 mg Oral BID  . insulin aspart  0-9 Units Subcutaneous TID WC  . levofloxacin (LEVAQUIN) IV  750 mg Intravenous Q24H  . levothyroxine  150 mcg Oral QAC breakfast  . metronidazole  500 mg Intravenous Q8H  . pantoprazole  40 mg Oral Daily  . traZODone  200 mg Oral QHS   Continuous Infusions: . sodium chloride 100 mL/hr at 08/17/16 0600   PRN Meds:.acetaminophen **OR** acetaminophen, albuterol, HYDROmorphone (DILAUDID) injection, HYDROmorphone (DILAUDID) injection, nitroGLYCERIN, ondansetron **OR** ondansetron (ZOFRAN) IV, promethazine, traMADol CBC Latest Ref Rng & Units 08/16/2016 08/16/2016 08/16/2016  WBC 3.6 - 11.0 K/uL - - 12.9(H)  Hemoglobin 12.0 - 16.0 g/dL 7.2(L) 9.8(L) 9.7(L)  Hematocrit 35.0 - 47.0 % - 29.5(L) 29.5(L)  Platelets 150 - 440 K/uL - - 160     Assessment: Active Problems:   Acute diverticulitis   Lower GI bleed  [name] is a 71 y.o. y/o female with with a history of colon polyps, diverticulitis presents to the Er today with LLQ pain and CT scan findings consistent with diverticulitis without perforation. In addition had an active diverticular bleeding followed by IR guided embolization. Overnight one large bloody bowel movement, she has had a few units of blood.   Plan   1. Continue antibiotics 2. Monitor CBC, hold anticoagulants 3.If has further bleeding the next step would be surgery , if has no bleeding then , Suggest out patient colonoscopy in 6-8 weeks once diverticulitis has resolved, she may eventually need surgery   I will sign off.  Please call me if any further GI concerns or questions.  We would like to thank you for the opportunity to  participate in the care of [name].     LOS: 1 day   Jonathon Bellows 08/17/2016, 7:20 AM

## 2016-08-17 NOTE — Plan of Care (Signed)
Problem: Pain Managment: Goal: General experience of comfort will improve Outcome: Progressing After changing around ordered PRN pain medications, by this afternoon patient has been able to find relief from abdominal pain and has rested.

## 2016-08-17 NOTE — Progress Notes (Signed)
08/17/2016  Subjective: Patient has remained hemodynamically stable after embolization yesterday with Dr. Lucky Cowboy.  Had some bloody bowel movements afterwards, but per nurse, contained clots.  Hgb has remained stable after transfusion.  Pt reports continued lower abdominal pain and pressure in her rectum.    Vital signs: Temp:  [97.5 F (36.4 C)-99.7 F (37.6 C)] 98.6 F (37 C) (03/16 1200) Pulse Rate:  [62-114] 82 (03/16 1300) Resp:  [13-25] 15 (03/16 1300) BP: (82-156)/(54-95) 117/62 (03/16 1300) SpO2:  [95 %-99 %] 95 % (03/16 1300) Weight:  [87.6 kg (193 lb 2 oz)] 87.6 kg (193 lb 2 oz) (03/15 1815)   Intake/Output: 03/15 0701 - 03/16 0700 In: 4260 [I.V.:1400; Blood:1320; IV Piggyback:1540] Out: 200 [Stool:200] Last BM Date: 08/16/16  Physical Exam: Constitutional:  No acute distress Abdomen:  Soft, nondistended, tender to palpation in the low abdomen.  No peritoneal signs.  Labs:   Recent Labs  08/16/16 0906  08/17/16 0722 08/17/16 0948  WBC 12.9*  --  19.1*  --   HGB 9.7*  < > 12.1 12.2  HCT 29.5*  < > 36.1 36.4  PLT 160  --  148*  --   < > = values in this interval not displayed.  Recent Labs  08/16/16 0632 08/17/16 0722  NA 135 132*  K 4.2 4.8  CL 102 103  CO2 25 23  GLUCOSE 243* 364*  BUN 18 24*  CREATININE 0.80 1.06*  CALCIUM 8.2* 6.8*    Recent Labs  08/16/16 0632  LABPROT 14.6  INR 1.13    Imaging: Nm Gi Blood Loss  Result Date: 08/16/2016 CLINICAL DATA:  Lower GI bleed. EXAM: NUCLEAR MEDICINE GASTROINTESTINAL BLEEDING SCAN TECHNIQUE: Sequential abdominal images were obtained following intravenous administration of Tc-8m labeled red blood cells. RADIOPHARMACEUTICALS:  23.6 mCi Tc-79m in-vitro labeled red cells. COMPARISON:  None. FINDINGS: On the early images there is a 8 tubular shaped focus of increased radiotracer uptake in the left lower quadrant of the abdomen. This increases in intensity over time in there is antegrade progression of the  radiotracer conforming to the expected location of the sigmoid colon. IMPRESSION: 1. Examination is positive for active GI bleeding within the left lower quadrant of the abdomen and is likely at the level of the distal descending colon/proximal sigmoid colon. Electronically Signed   By: Kerby Moors M.D.   On: 08/16/2016 17:22   Dg Abd Acute W/chest  Result Date: 08/17/2016 CLINICAL DATA:  Diverticulitis with abdominal pain EXAM: DG ABDOMEN ACUTE W/ 1V CHEST COMPARISON:  08/16/2016 FINDINGS: Cardiac shadow is mildly enlarged. Postsurgical changes are noted. The lungs are well aerated bilaterally. No focal infiltrate or sizable effusion is seen. Scattered large and small bowel gas is noted. Contrast material is noted within the colon. No free air is seen. The bladder is well distended with opacified urine. No acute bony abnormality is noted. IMPRESSION: No acute abnormality seen.  No perforation is identified. Electronically Signed   By: Inez Catalina M.D.   On: 08/17/2016 10:50    Assessment/Plan: 71 yo female with diverticulitis and GI bleed, s/p embolization.  --continue IV abx and NPO / conservative management for her diverticulitis. --Hgb stable currently.  Discussed with patient that even after embolization procedure, it is not uncommon to have some blood per rectum as she did have a significant bleeder.  Will continue to monitor. --Given the embolization, the main concern following would be if there's any bowel ischemia.  Currently the patient has significant pain but  is stable according to her.  Will continue to monitor.  May need a lactic acid check if worsening pain. --discussed with patient that at this time she does not require emergent surgery.  If she rebleeds or becomes hypotensive, or there is bowel ischemia, she may need a partial colectomy, likely with colostomy.  If she continues to improve with conservative management, also discussed with patient that there may be a role for elective  partial colectomy as outpatient to prevent recurrence of this event.   Melvyn Neth, [place]

## 2016-08-17 NOTE — Progress Notes (Signed)
[facility] [place] NAME: [name]    MR#:  [id number]  DATE OF BIRTH:  1946/04/12  SUBJECTIVE:  CHIEF COMPLAINT:  Patient is still complaining of 8-9 out of 10 lower abdominal pain , dry mouth and sore throat. Patient had 2 bowel movements with blood today a.m. according to the nursing staff  REVIEW OF SYSTEMS:  CONSTITUTIONAL: No fever, fatigue or weakness.  EYES: No blurred or double vision.  EARS, NOSE, AND THROAT: No tinnitus or ear pain.  RESPIRATORY: No cough, shortness of breath, wheezing or hemoptysis.  CARDIOVASCULAR: No chest pain, orthopnea, edema.  GASTROINTESTINAL: No nausea, vomiting, diarrhea  a. Reporting lowerbdominal pain.  GENITOURINARY: No dysuria, hematuria.  ENDOCRINE: No polyuria, nocturia,  HEMATOLOGY: No anemia, easy bruising or bleeding SKIN: No rash or lesion. MUSCULOSKELETAL: No joint pain or arthritis.   NEUROLOGIC: No tingling, numbness, weakness.  PSYCHIATRY: No anxiety or depression.   DRUG ALLERGIES:   Allergies  Allergen Reactions  . Amitriptyline Other (See Comments)    Unknown reaction  . Benadryl [Diphenhydramine] Shortness Of Breath  . Demerol [Meperidine] Other (See Comments)    Unknown reaction  . Gabapentin Other (See Comments)    Unknown reaction  . Loratadine Other (See Comments)    Unknown reaction  . Meperidine Hcl Other (See Comments)    Unknown reaction  . Mirtazapine Other (See Comments)    Unknown reaction  . Olanzapine Other (See Comments)    Unknown reaction   . Voltaren [Diclofenac Sodium] Shortness Of Breath  . Zetia [Ezetimibe] Other (See Comments)    Weakness in legs, shakiness all over  . Ativan [Lorazepam] Other (See Comments)    Causes double vision at highter than .5 mg dose  . Atorvastatin Other (See Comments)    Muscle aches and weakness  . Budesonide-Formoterol Fumarate Other (See Comments)    Shakiness, tremors  . Bupropion Hcl Other (See Comments)     "cloud over me" depression  . Caffeine Other (See Comments)    jitters  . Codeine Sulfate Other (See Comments)    Makes chest hurt like a heart attack  . Lisinopril Cough  . Metformin Nausea And Vomiting  . Mometasone Furoate Nausea And Vomiting  . Morphine Sulfate Other (See Comments)    Chest pain like a heart attack  . Other Other (See Comments)    Beta Blockers, reaction shortness of breath  . Oxycodone-Acetaminophen Nausea And Vomiting  . Pioglitazone Other (See Comments)    Cannot take because of risk of bladder cancer  . Propoxyphene N-Acetaminophen Nausea And Vomiting  . Rosuvastatin Other (See Comments)    Muscle aches and weakness  . Shellfish Allergy Diarrhea  . Tramadol Nausea Only  . Venlafaxine Other (See Comments)    Unknown reaction  . Zolpidem Tartrate Other (See Comments)     Jittery, diarrhea  . Latex Rash    VITALS:  Blood pressure 131/75, pulse (!) 107, temperature 98.9 F (37.2 C), temperature source Oral, resp. rate 17, height 5\' 6"  (1.676 m), weight 87.6 kg (193 lb 2 oz), SpO2 95 %.  PHYSICAL EXAMINATION:  GENERAL:  71 y.o.-year-old patient lying in the bed with no acute distress.  EYES: Pupils equal, round, reactive to light and accommodation. No scleral icterus. Extraocular muscles intact.  HEENT: Head atraumatic, normocephalic. Oropharynx and nasopharynx clear.  NECK:  Supple, no jugular venous distention. No thyroid enlargement, no tenderness.  LUNGS: Normal breath sounds bilaterally, no wheezing, rales,rhonchi  or crepitation. No use of accessory muscles of respiration.  CARDIOVASCULAR: S1, S2 normal. No murmurs, rubs, or gallops.  ABDOMEN: Soft,Lower abdominal tenderness is present no rebound tenderness some distention is present  Bowel sounds present. No organomegaly or mass.  EXTREMITIES: No pedal edema, cyanosis, or clubbing.  NEUROLOGIC: Cranial nerves II through XII are intact. Muscle strength 5/5 in all extremities. Sensation intact. Gait  not checked.  PSYCHIATRIC: The patient is alert and oriented x 3.  SKIN: No obvious rash, lesion, or ulcer.    LABORATORY PANEL:   CBC  Recent Labs Lab 08/17/16 0722  WBC 19.1*  HGB 12.1  HCT 36.1  PLT 148*   ------------------------------------------------------------------------------------------------------------------  Chemistries   Recent Labs Lab 08/17/16 0722  NA 132*  K 4.8  CL 103  CO2 23  GLUCOSE 364*  BUN 24*  CREATININE 1.06*  CALCIUM 6.8*  AST 25  ALT 14  ALKPHOS 64  BILITOT 0.6   ------------------------------------------------------------------------------------------------------------------  Cardiac Enzymes No results for input(s): TROPONINI in the last 168 hours. ------------------------------------------------------------------------------------------------------------------  RADIOLOGY:  Nm Gi Blood Loss  Result Date: 08/16/2016 CLINICAL DATA:  Lower GI bleed. EXAM: NUCLEAR MEDICINE GASTROINTESTINAL BLEEDING SCAN TECHNIQUE: Sequential abdominal images were obtained following intravenous administration of Tc-54m labeled red blood cells. RADIOPHARMACEUTICALS:  23.6 mCi Tc-21m in-vitro labeled red cells. COMPARISON:  None. FINDINGS: On the early images there is a 8 tubular shaped focus of increased radiotracer uptake in the left lower quadrant of the abdomen. This increases in intensity over time in there is antegrade progression of the radiotracer conforming to the expected location of the sigmoid colon. IMPRESSION: 1. Examination is positive for active GI bleeding within the left lower quadrant of the abdomen and is likely at the level of the distal descending colon/proximal sigmoid colon. Electronically Signed   By: Kerby Moors M.D.   On: 08/16/2016 17:22   Ct Abdomen Pelvis W Contrast  Result Date: 08/16/2016 CLINICAL DATA:  Left lower quadrant pain. Rectal bleeding. History of diverticulitis. EXAM: CT ABDOMEN AND PELVIS WITH CONTRAST TECHNIQUE:  Multidetector CT imaging of the abdomen and pelvis was performed using the standard protocol following bolus administration of intravenous contrast. CONTRAST:  174mL ISOVUE-300 IOPAMIDOL (ISOVUE-300) INJECTION 61% COMPARISON:  None. FINDINGS: Lower chest:  Aortic valve replacement.  No acute finding Hepatobiliary: No focal liver abnormality.Cholecystectomy with normal common bile duct diameter Pancreas: Unremarkable. Spleen: Low-density following the lateral capsule, likely from remote injury. No acute finding. Adrenals/Urinary Tract: Negative adrenals. No hydronephrosis or stone. Unremarkable bladder. Stomach/Bowel: Fat stranding around a descending sigmoid junction diverticulum. Patient is status post sigmoidectomy. Appendix not identified. No pericecal inflammation. No bowel obstruction. Vascular/Lymphatic: Extensive atherosclerosis. Diffuse irregular plaque and intimal distortion along the aorta. No acute vascular finding. Major mesenteric vessels are patent. No mass or adenopathy. Reproductive:Hysterectomy. Other: No ascites or pneumoperitoneum. Musculoskeletal: Degenerative changes without acute finding. IMPRESSION: 1. Uncomplicated diverticulitis at the descending sigmoid junction. 2. Advanced atherosclerosis and other chronic findings described above. Electronically Signed   By: Monte Fantasia M.D.   On: 08/16/2016 07:45    EKG:   Orders placed or performed in visit on 06/28/16  . EKG 12-Lead    ASSESSMENT AND PLAN:   Tyronica [place]  is a 71 y.o. female with a known history of Coronary artery disease status post CABG, COPD, diabetes mellitus, hypothyroidism, history of a diverticulitis in the past status post partial colectomy for the same and other medical problems is presenting to the ED with a chief complaint of lower  abdominal pain and bleeding per rectum started at around 3 AM today. Denies any fever. CT abdomen in the ED has revealed acute diverticulitis hospitalist team is called to admit the  patient. Patient is reporting 7 out of 10 lower abdominal pain  #Acute lower abdominal pain with lower GI bleed secondary to acute diverticulitis GI bleeding scan was positive for active bleeding. Status post embolization of 3 vessels by  vascular surgery Dr. dew on 08/16/2016 -Gen. surgery consult is placed and discussed with Dr. Maximino Greenland possible colectomy if bleeding is uncontrollable following embolization, he is actively following . Patient had history of colectomy in the past for acute diverticulitis -Status post 4 units of blood transfusion. Continue to monitor hemoglobin and hematocrit every 6 hours and repeat a.m. labs  -Levo Floxin and Flagyl -Pain management as needed with IV Dilaudid  -Nothing by mouth and IV fluids -Vascular and gastroenterology signed off at this time  -Hold aspirin and Effient have home medications  will get abdominal x-ray  #Insulin dependen diabetes mellitus Currently patient is nothing by mouth on IV fluids provided sliding scale insulin Check hemoglobin A1c in a.m.  #History of coronary artery disease status post CABG Hold aspirin and Effient in view of active GI bleed Patient denies any chest pain at this time  #Chronic history of COPD no acute exacerbation Provide nebulizer treatments as needed basis  #Hypothyroidism continue Synthroid Check TSH in a.m.     All the records are reviewed and case discussed with Care Management/Social Workerr. Management plans discussed with the patient, husband and they are in agreement.  CODE STATUS: fc   TOTAL TIME TAKING CARE OF THIS PATIENT: 35  minutes.   POSSIBLE D/C IN 1-2 DAYS, DEPENDING ON CLINICAL CONDITION.  Note: This dictation was prepared with Dragon dictation along with smaller phrase technology. Any transcriptional errors that result from this process are unintentional.   Nicholes Mango M.D on 08/17/2016 at 9:48 AM  Between 7am to 6pm - Pager - [phone] After 6pm go to  [email or web address] - password [email or web address] [facility]  Office  [phone]  CC: Primary care physician; Glendon Axe, MD

## 2016-08-17 NOTE — Progress Notes (Signed)
Inpatient Diabetes Program Recommendations  AACE/ADA: New Consensus Statement on Inpatient Glycemic Control (2015)  Target Ranges:  Prepandial:   less than 140 mg/dL      Peak postprandial:   less than 180 mg/dL (1-2 hours)      Critically ill patients:  140 - 180 mg/dL   Lab Results  Component Value Date   GLUCAP 354 (H) 08/17/2016   HGBA1C 6.9 (H) 05/25/2016    Review of Glycemic Control  Results for DHAMAR, GREGORY (MRN [id number]) as of 08/17/2016 10:08  Ref. Range 08/16/2016 11:38 08/16/2016 18:27 08/16/2016 21:09 08/17/2016 07:04  Glucose-Capillary Latest Ref Range: 65 - 99 mg/dL 178 (H) 355 (H) 324 (H) 354 (H)    Diabetes history: Type 2 Outpatient Diabetes medications: Humulin 70/30 70 units bid Current orders for Inpatient glycemic control: Novolog 0-9 units tid  Inpatient Diabetes Program Recommendations:  Please consider adding Levemir 25 units bid starting now (takes a total of 96 units of basal q day and 42 units of mealtime insulin per day)  Increase Novolog correction to moderate correction 0-15 units q4h since the patient is NPO.   Test page Dr. Margaretmary Eddy at 10:18am discussed with RN Madlyn Frankel, RN, BA, MHA, CDE Diabetes Coordinator Inpatient Diabetes Program  [phone] (Team Pager) [phone] ([place]) 08/17/2016 10:18 AM

## 2016-08-18 LAB — CBC
HCT: 31.2 % — ABNORMAL LOW (ref 35.0–47.0)
Hemoglobin: 10.6 g/dL — ABNORMAL LOW (ref 12.0–16.0)
MCH: 28.5 pg (ref 26.0–34.0)
MCHC: 34 g/dL (ref 32.0–36.0)
MCV: 83.9 fL (ref 80.0–100.0)
PLATELETS: 140 10*3/uL — AB (ref 150–440)
RBC: 3.72 MIL/uL — ABNORMAL LOW (ref 3.80–5.20)
RDW: 15.7 % — ABNORMAL HIGH (ref 11.5–14.5)
WBC: 17.8 10*3/uL — ABNORMAL HIGH (ref 3.6–11.0)

## 2016-08-18 LAB — BPAM RBC
BLOOD PRODUCT EXPIRATION DATE: 201803282359
BLOOD PRODUCT EXPIRATION DATE: 201804042359
BLOOD PRODUCT EXPIRATION DATE: 201804042359
Blood Product Expiration Date: 201803292359
Blood Product Expiration Date: 201803302359
Blood Product Expiration Date: 201804042359
ISSUE DATE / TIME: 201803151652
ISSUE DATE / TIME: 201803162129
ISSUE DATE / TIME: 201803151957
ISSUE DATE / TIME: 201803160036
ISSUE DATE / TIME: 201803160335
UNIT TYPE AND RH: 5100
UNIT TYPE AND RH: 7300
Unit Type and Rh: 5100
Unit Type and Rh: 5100
Unit Type and Rh: 7300
Unit Type and Rh: 7300

## 2016-08-18 LAB — TYPE AND SCREEN
ABO/RH(D): B POS
Antibody Screen: NEGATIVE
UNIT DIVISION: 0
UNIT DIVISION: 0
UNIT DIVISION: 0
Unit division: 0
Unit division: 0
Unit division: 0

## 2016-08-18 LAB — BASIC METABOLIC PANEL
Anion gap: 8 (ref 5–15)
BUN: 23 mg/dL — AB (ref 6–20)
CO2: 25 mmol/L (ref 22–32)
CREATININE: 0.92 mg/dL (ref 0.44–1.00)
Calcium: 7.4 mg/dL — ABNORMAL LOW (ref 8.9–10.3)
Chloride: 101 mmol/L (ref 101–111)
GFR calc Af Amer: >60 mL/min (ref >60)
GLUCOSE: 192 mg/dL — AB (ref 65–99)
Potassium: 4.2 mmol/L (ref 3.5–5.1)
SODIUM: 134 mmol/L — AB (ref 135–145)

## 2016-08-18 LAB — LACTIC ACID, PLASMA
LACTIC ACID, VENOUS: 1.2 mmol/L (ref 0.5–1.9)
Lactic Acid, Venous: 1.1 mmol/L (ref 0.5–1.9)

## 2016-08-18 LAB — TSH: TSH: 1.506 u[IU]/mL (ref 0.350–4.500)

## 2016-08-18 LAB — GLUCOSE, CAPILLARY
GLUCOSE-CAPILLARY: 108 mg/dL — AB (ref 65–99)
GLUCOSE-CAPILLARY: 102 mg/dL — AB (ref 65–99)
GLUCOSE-CAPILLARY: 123 mg/dL — AB (ref 65–99)
Glucose-Capillary: 131 mg/dL — ABNORMAL HIGH (ref 65–99)
Glucose-Capillary: 132 mg/dL — ABNORMAL HIGH (ref 65–99)
Glucose-Capillary: 161 mg/dL — ABNORMAL HIGH (ref 65–99)
Glucose-Capillary: 167 mg/dL — ABNORMAL HIGH (ref 65–99)
Glucose-Capillary: 171 mg/dL — ABNORMAL HIGH (ref 65–99)

## 2016-08-18 LAB — HEMOGLOBIN A1C
HEMOGLOBIN A1C: 6 % — AB (ref 4.8–5.6)
Mean Plasma Glucose: 126 mg/dL

## 2016-08-18 MED ORDER — SODIUM CHLORIDE 0.9 % IV SOLN
INTRAVENOUS | Status: DC
  Administered 2016-08-19 – 2016-08-20 (×3): via INTRAVENOUS

## 2016-08-18 MED ORDER — INSULIN ASPART 100 UNIT/ML ~~LOC~~ SOLN
0.0000 [IU] | Freq: Four times a day (QID) | SUBCUTANEOUS | Status: DC
  Administered 2016-08-19 – 2016-08-20 (×3): 3 [IU] via SUBCUTANEOUS
  Administered 2016-08-21: 11 [IU] via SUBCUTANEOUS
  Administered 2016-08-21: 3 [IU] via SUBCUTANEOUS
  Administered 2016-08-21: 2 [IU] via SUBCUTANEOUS
  Administered 2016-08-22: 3 [IU] via SUBCUTANEOUS
  Administered 2016-08-22: 2 [IU] via SUBCUTANEOUS
  Administered 2016-08-22 – 2016-08-24 (×6): 3 [IU] via SUBCUTANEOUS
  Administered 2016-08-24: 2 [IU] via SUBCUTANEOUS
  Filled 2016-08-18: qty 11
  Filled 2016-08-18: qty 2
  Filled 2016-08-18: qty 3
  Filled 2016-08-18: qty 2
  Filled 2016-08-18 (×3): qty 3
  Filled 2016-08-18: qty 2
  Filled 2016-08-18 (×7): qty 3

## 2016-08-18 MED ORDER — INSULIN DETEMIR 100 UNIT/ML ~~LOC~~ SOLN
15.0000 [IU] | Freq: Two times a day (BID) | SUBCUTANEOUS | Status: DC
  Administered 2016-08-18 – 2016-08-24 (×12): 15 [IU] via SUBCUTANEOUS
  Filled 2016-08-18 (×19): qty 0.15

## 2016-08-18 NOTE — Progress Notes (Signed)
08/18/2016  Subjective: Patient reports improvement in her lower abdominal pain.  Also has not had and frank blood per rectum.  Did have bowel movements yesterday that was dark brown with tinge of old blood only.  No fevers, and has been hemodynamically stable.  Vital signs: Temp:  [98.1 F (36.7 C)-100.8 F (38.2 C)] 99.3 F (37.4 C) (03/17 0800) Pulse Rate:  [82-96] 82 (03/17 0735) Resp:  [11-20] 18 (03/17 0735) BP: (100-145)/(49-88) 117/58 (03/17 0735) SpO2:  [93 %-98 %] 97 % (03/17 0735)   Intake/Output: 03/16 0701 - 03/17 0700 In: 300 [IV Piggyback:300] Out: -  Last BM Date: 08/17/16  Physical Exam: Constitutional: No acute distress Abdomen:  Soft, nondistended, improved tenderness to palpation over lower abdomen.  No peritoneal signs.    Labs:   Recent Labs  08/17/16 1544 08/17/16 2132 08/18/16 0528  WBC 26.8*  --  17.8*  HGB 11.5* 10.9* 10.6*  HCT 34.7* 32.5* 31.2*  PLT 165  --  140*    Recent Labs  08/17/16 0722 08/18/16 0528  NA 132* 134*  K 4.8 4.2  CL 103 101  CO2 23 25  GLUCOSE 364* 192*  BUN 24* 23*  CREATININE 1.06* 0.92  CALCIUM 6.8* 7.4*    Recent Labs  08/16/16 0632  LABPROT 14.6  INR 1.13    Imaging: Dg Abd Acute W/chest  Result Date: 08/17/2016 CLINICAL DATA:  Diverticulitis with abdominal pain EXAM: DG ABDOMEN ACUTE W/ 1V CHEST COMPARISON:  08/16/2016 FINDINGS: Cardiac shadow is mildly enlarged. Postsurgical changes are noted. The lungs are well aerated bilaterally. No focal infiltrate or sizable effusion is seen. Scattered large and small bowel gas is noted. Contrast material is noted within the colon. No free air is seen. The bladder is well distended with opacified urine. No acute bony abnormality is noted. IMPRESSION: No acute abnormality seen.  No perforation is identified. Electronically Signed   By: Inez Catalina M.D.   On: 08/17/2016 10:50    Assessment/Plan: 71 yo female with diverticulitis and GI bleed, s/p embolization by  Dr. Lucky Cowboy.  --No acute surgical needs at this time.  The patient's pain has been improving and her WBC is improving today.  Low suspicion for ischemia given these findings. --Continue IV antibiotics --May start patient on sips / ice chips today, likely start advancing to clears tomorrow as she continues to improve. --OK from surgical standpoint to transfer to floor   Melvyn Neth, [place] 7a-7p: [place] 7p-7a: [phone]

## 2016-08-18 NOTE — Progress Notes (Signed)
Pharmacy Antibiotic Note  [name] is a 71 y.o. female admitted on 08/16/2016 with acute diverticulitis.  Pharmacy has been consulted for Levaquin dosing. Patient also ordered Flagyl 500mg  IV q8h. Patient is currently NPO  Plan: Day 3- Levaquin 750 mg IV q24h   Height: 5\' 6"  (167.6 cm) Weight: 193 lb 2 oz (87.6 kg) IBW/kg (Calculated) : 59.3  Temp (24hrs), Avg:99.3 F (37.4 C), Min:98.1 F (36.7 C), Max:100.8 F (38.2 C)   Recent Labs Lab 08/16/16 0632 08/16/16 0906 08/17/16 0722 08/17/16 1544 08/18/16 0528 08/18/16 0914  WBC 15.6* 12.9* 19.1* 26.8* 17.8*  --   CREATININE 0.80  --  1.06*  --  0.92  --   LATICACIDVEN  --   --   --   --   --  1.1    Estimated Creatinine Clearance: 62.5 mL/min (by C-G formula based on SCr of 0.92 mg/dL).    Allergies  Allergen Reactions  . Amitriptyline Other (See Comments)    Unknown reaction  . Benadryl [Diphenhydramine] Shortness Of Breath  . Demerol [Meperidine] Other (See Comments)    Unknown reaction  . Gabapentin Other (See Comments)    Unknown reaction  . Loratadine Other (See Comments)    Unknown reaction  . Meperidine Hcl Other (See Comments)    Unknown reaction  . Mirtazapine Other (See Comments)    Unknown reaction  . Olanzapine Other (See Comments)    Unknown reaction   . Voltaren [Diclofenac Sodium] Shortness Of Breath  . Zetia [Ezetimibe] Other (See Comments)    Weakness in legs, shakiness all over  . Ativan [Lorazepam] Other (See Comments)    Causes double vision at highter than .5 mg dose  . Atorvastatin Other (See Comments)    Muscle aches and weakness  . Budesonide-Formoterol Fumarate Other (See Comments)    Shakiness, tremors  . Bupropion Hcl Other (See Comments)    "cloud over me" depression  . Caffeine Other (See Comments)    jitters  . Codeine Sulfate Other (See Comments)    Makes chest hurt like a heart attack  . Lisinopril Cough  . Metformin Nausea And Vomiting  . Mometasone Furoate Nausea  And Vomiting  . Morphine Sulfate Other (See Comments)    Chest pain like a heart attack  . Other Other (See Comments)    Beta Blockers, reaction shortness of breath  . Oxycodone-Acetaminophen Nausea And Vomiting  . Pioglitazone Other (See Comments)    Cannot take because of risk of bladder cancer  . Propoxyphene N-Acetaminophen Nausea And Vomiting  . Rosuvastatin Other (See Comments)    Muscle aches and weakness  . Shellfish Allergy Diarrhea  . Tramadol Nausea Only  . Venlafaxine Other (See Comments)    Unknown reaction  . Zolpidem Tartrate Other (See Comments)     Jittery, diarrhea  . Latex Rash    Antimicrobials this admission: Metronidazole 3/15 >>  Levofloxacin 3/15 >>   Pharmacy will continue to monitor and adjust per consult.  Chinita Greenland PharmD Clinical Pharmacist 08/18/2016

## 2016-08-18 NOTE — Progress Notes (Signed)
Prepare for transfer to Rm 226 per bed. Report given.  Husband and son at bedside.  Pt alert and oriented. Dilaudid 1mg  x2 today. Last dose recent. Given for abdominal pain. Smear stool x2 is small mushy brown. Taking sips and chips. VSS.  Seen by vascular surgery, CCM , and hospitalist.

## 2016-08-18 NOTE — Progress Notes (Addendum)
[facility] Progess Note    ASSESSMENT/PLAN   Lower GI bleed. Status post IR guided embolization. Has had much improved stool output. No additional bright red blood, melena noted with decreasing amounts. Hemoglobin is 10.6. Patient's left lower quadrant pain is also has improved. Will order a lactic acid empirically post embolization  Leukocytosis. Patient is presently on Levaquin and Flagyl empirically  Prerenal azotemia.  Mild hyperglycemia  No additional Recommendations from critical care standpoint. We'll be available if any issues arise, please reconsult.  Name: INDYAH SAULNIER MRN: [id number] DOB: [birth year]    ADMISSION DATE:  08/16/2016   SUBJECTIVE:   Patient states that she has had decreasing amounts of dark stool, also her left lower quadrant pain has significantly improved.  Review of Systems:  Constitutional: Feels well. Cardiovascular: No chest pain.  Pulmonary: Denies dyspnea.   The remainder of systems were reviewed and were found to be negative other than what is documented in the HPI.    VITAL SIGNS: Temp:  [98.1 F (36.7 C)-100.8 F (38.2 C)] 99.3 F (37.4 C) (03/17 0800) Pulse Rate:  [82-96] 82 (03/17 0735) Resp:  [11-20] 18 (03/17 0735) BP: (100-145)/(49-88) 117/58 (03/17 0735) SpO2:  [93 %-98 %] 97 % (03/17 0735) HEMODYNAMICS:   INTAKE / OUTPUT:  Intake/Output Summary (Last 24 hours) at 08/18/16 0855 Last data filed at 08/18/16 0224  Gross per 24 hour  Intake              300 ml  Output                0 ml  Net              300 ml    PHYSICAL EXAMINATION: Physical Examination:   VS: BP (!) 117/58   Pulse 82   Temp 99.3 F (37.4 C) (Oral)   Resp 18   Ht 5\' 6"  (1.676 m)   Wt 87.6 kg (193 lb 2 oz)   SpO2 97%   BMI 31.17 kg/m   General Appearance: No distress  Neuro:without focal findings, mental status normal. HEENT: PERRLA, EOM intact. Pulmonary: normal breath sounds   CardiovascularNormal S1,S2.  No  m/r/g.   Abdomen: Benign, Soft, Positive bowel sounds noted with tenderness over left lower quadrant with palpation. Skin:   warm, no rashes, no ecchymosis  Extremities: normal, no cyanosis, clubbing.   LABS:   LABORATORY PANEL:   CBC  Recent Labs Lab 08/18/16 0528  WBC 17.8*  HGB 10.6*  HCT 31.2*  PLT 140*    Chemistries   Recent Labs Lab 08/17/16 0722 08/18/16 0528  NA 132* 134*  K 4.8 4.2  CL 103 101  CO2 23 25  GLUCOSE 364* 192*  BUN 24* 23*  CREATININE 1.06* 0.92  CALCIUM 6.8* 7.4*  AST 25  --   ALT 14  --   ALKPHOS 64  --   BILITOT 0.6  --      Recent Labs Lab 08/17/16 1635 08/17/16 2004 08/17/16 2211 08/18/16 0018 08/18/16 0357 08/18/16 0753  GLUCAP 207* 193* 182* 161* 167* 171*   No results for input(s): PHART, PCO2ART, PO2ART in the last 168 hours.  Recent Labs Lab 08/16/16 0632 08/17/16 0722  AST 17 25  ALT 13* 14  ALKPHOS 83 64  BILITOT 0.5 0.6  ALBUMIN 3.2* 2.7*    Cardiac Enzymes No results for input(s): TROPONINI in the last 168 hours.  RADIOLOGY:  Nm Gi Blood Loss  Result Date:  08/16/2016 CLINICAL DATA:  Lower GI bleed. EXAM: NUCLEAR MEDICINE GASTROINTESTINAL BLEEDING SCAN TECHNIQUE: Sequential abdominal images were obtained following intravenous administration of Tc-52m labeled red blood cells. RADIOPHARMACEUTICALS:  23.6 mCi Tc-27m in-vitro labeled red cells. COMPARISON:  None. FINDINGS: On the early images there is a 8 tubular shaped focus of increased radiotracer uptake in the left lower quadrant of the abdomen. This increases in intensity over time in there is antegrade progression of the radiotracer conforming to the expected location of the sigmoid colon. IMPRESSION: 1. Examination is positive for active GI bleeding within the left lower quadrant of the abdomen and is likely at the level of the distal descending colon/proximal sigmoid colon. Electronically Signed   By: Kerby Moors M.D.   On: 08/16/2016 17:22   Dg Abd  Acute W/chest  Result Date: 08/17/2016 CLINICAL DATA:  Diverticulitis with abdominal pain EXAM: DG ABDOMEN ACUTE W/ 1V CHEST COMPARISON:  08/16/2016 FINDINGS: Cardiac shadow is mildly enlarged. Postsurgical changes are noted. The lungs are well aerated bilaterally. No focal infiltrate or sizable effusion is seen. Scattered large and small bowel gas is noted. Contrast material is noted within the colon. No free air is seen. The bladder is well distended with opacified urine. No acute bony abnormality is noted. IMPRESSION: No acute abnormality seen.  No perforation is identified. Electronically Signed   By: Inez Catalina M.D.   On: 08/17/2016 10:50   Hermelinda Dellen, DO  08/18/2016

## 2016-08-18 NOTE — Progress Notes (Signed)
[name] at [place] NAME: [name]    MR#:  [id number]  DATE OF BIRTH:  1945-07-26  SUBJECTIVE:  CHIEF COMPLAINT:  Patient Has less pain in lower abdominal area, however, is nothing by mouth, complains of dry mouth and sore throat. Patient did have recent 2 bowel movements with no bleeding today. Hemoglobin level is stable. Lactic acid level is normal. The patient remains nothing by mouth, per surgery, now on IV fluids  REVIEW OF SYSTEMS:  CONSTITUTIONAL: No fever, fatigue or weakness.  EYES: No blurred or double vision.  EARS, NOSE, AND THROAT: No tinnitus or ear pain.  RESPIRATORY: No cough, shortness of breath, wheezing or hemoptysis.  CARDIOVASCULAR: No chest pain, orthopnea, edema.  GASTROINTESTINAL: No nausea, vomiting, diarrhea  a. Reporting lower abdominal pain, tenesmus.  GENITOURINARY: No dysuria, hematuria.  ENDOCRINE: No polyuria, nocturia,  HEMATOLOGY: No anemia, easy bruising or bleeding SKIN: No rash or lesion. MUSCULOSKELETAL: No joint pain or arthritis.   NEUROLOGIC: No tingling, numbness, weakness.  PSYCHIATRY: No anxiety or depression.   DRUG ALLERGIES:   Allergies  Allergen Reactions  . Amitriptyline Other (See Comments)    Unknown reaction  . Benadryl [Diphenhydramine] Shortness Of Breath  . Demerol [Meperidine] Other (See Comments)    Unknown reaction  . Gabapentin Other (See Comments)    Unknown reaction  . Loratadine Other (See Comments)    Unknown reaction  . Meperidine Hcl Other (See Comments)    Unknown reaction  . Mirtazapine Other (See Comments)    Unknown reaction  . Olanzapine Other (See Comments)    Unknown reaction   . Voltaren [Diclofenac Sodium] Shortness Of Breath  . Zetia [Ezetimibe] Other (See Comments)    Weakness in legs, shakiness all over  . Ativan [Lorazepam] Other (See Comments)    Causes double vision at highter than .5 mg dose  . Atorvastatin Other (See Comments)     Muscle aches and weakness  . Budesonide-Formoterol Fumarate Other (See Comments)    Shakiness, tremors  . Bupropion Hcl Other (See Comments)    "cloud over me" depression  . Caffeine Other (See Comments)    jitters  . Codeine Sulfate Other (See Comments)    Makes chest hurt like a heart attack  . Lisinopril Cough  . Metformin Nausea And Vomiting  . Mometasone Furoate Nausea And Vomiting  . Morphine Sulfate Other (See Comments)    Chest pain like a heart attack  . Other Other (See Comments)    Beta Blockers, reaction shortness of breath  . Oxycodone-Acetaminophen Nausea And Vomiting  . Pioglitazone Other (See Comments)    Cannot take because of risk of bladder cancer  . Propoxyphene N-Acetaminophen Nausea And Vomiting  . Rosuvastatin Other (See Comments)    Muscle aches and weakness  . Shellfish Allergy Diarrhea  . Tramadol Nausea Only  . Venlafaxine Other (See Comments)    Unknown reaction  . Zolpidem Tartrate Other (See Comments)     Jittery, diarrhea  . Latex Rash    VITALS:  Blood pressure 102/68, pulse 83, temperature 99.3 F (37.4 C), temperature source Oral, resp. rate 18, height 5\' 6"  (1.676 m), weight 87.6 kg (193 lb 2 oz), SpO2 97 %.  PHYSICAL EXAMINATION:  GENERAL:  71 y.o.-year-old patient lying in the bed with no acute distress.  EYES: Pupils equal, round, reactive to light and accommodation. No scleral icterus. Extraocular muscles intact.  HEENT: Head atraumatic, normocephalic. Oropharynx and nasopharynx clear.  NECK:  Supple, no jugular venous distention. No thyroid enlargement, no tenderness.  LUNGS: Normal breath sounds bilaterally, no wheezing, rales,rhonchi or crepitation. No use of accessory muscles of respiration.  CARDIOVASCULAR: S1, S2 normal. No murmurs, rubs, or gallops.  ABDOMEN: Soft,Lower abdominal tenderness is present no rebound tenderness some distention is present  Bowel sounds present. No organomegaly or mass.  EXTREMITIES: No pedal edema,  cyanosis, or clubbing.  NEUROLOGIC: Cranial nerves II through XII are intact. Muscle strength 5/5 in all extremities. Sensation intact. Gait not checked.  PSYCHIATRIC: The patient is alert and oriented x 3.  SKIN: No obvious rash, lesion, or ulcer.    LABORATORY PANEL:   CBC  Recent Labs Lab 08/18/16 0528  WBC 17.8*  HGB 10.6*  HCT 31.2*  PLT 140*   ------------------------------------------------------------------------------------------------------------------  Chemistries   Recent Labs Lab 08/17/16 0722 08/18/16 0528  NA 132* 134*  K 4.8 4.2  CL 103 101  CO2 23 25  GLUCOSE 364* 192*  BUN 24* 23*  CREATININE 1.06* 0.92  CALCIUM 6.8* 7.4*  AST 25  --   ALT 14  --   ALKPHOS 64  --   BILITOT 0.6  --    ------------------------------------------------------------------------------------------------------------------  Cardiac Enzymes No results for input(s): TROPONINI in the last 168 hours. ------------------------------------------------------------------------------------------------------------------  RADIOLOGY:  Nm Gi Blood Loss  Result Date: 08/16/2016 CLINICAL DATA:  Lower GI bleed. EXAM: NUCLEAR MEDICINE GASTROINTESTINAL BLEEDING SCAN TECHNIQUE: Sequential abdominal images were obtained following intravenous administration of Tc-65m labeled red blood cells. RADIOPHARMACEUTICALS:  23.6 mCi Tc-42m in-vitro labeled red cells. COMPARISON:  None. FINDINGS: On the early images there is a 8 tubular shaped focus of increased radiotracer uptake in the left lower quadrant of the abdomen. This increases in intensity over time in there is antegrade progression of the radiotracer conforming to the expected location of the sigmoid colon. IMPRESSION: 1. Examination is positive for active GI bleeding within the left lower quadrant of the abdomen and is likely at the level of the distal descending colon/proximal sigmoid colon. Electronically Signed   By: Kerby Moors M.D.   On:  08/16/2016 17:22   Dg Abd Acute W/chest  Result Date: 08/17/2016 CLINICAL DATA:  Diverticulitis with abdominal pain EXAM: DG ABDOMEN ACUTE W/ 1V CHEST COMPARISON:  08/16/2016 FINDINGS: Cardiac shadow is mildly enlarged. Postsurgical changes are noted. The lungs are well aerated bilaterally. No focal infiltrate or sizable effusion is seen. Scattered large and small bowel gas is noted. Contrast material is noted within the colon. No free air is seen. The bladder is well distended with opacified urine. No acute bony abnormality is noted. IMPRESSION: No acute abnormality seen.  No perforation is identified. Electronically Signed   By: Inez Catalina M.D.   On: 08/17/2016 10:50    EKG:   Orders placed or performed in visit on 06/28/16  . EKG 12-Lead    ASSESSMENT AND PLAN:   [name]  is a 72 y.o. female with a known history of Coronary artery disease status post CABG, COPD, diabetes mellitus, hypothyroidism, history of a diverticulitis in the past status post partial colectomy for the same and other medical problems is presenting to the ED with a chief complaint of lower abdominal pain and bleeding per rectum started at around 3 AM today. Denies any fever. CT abdomen in the ED has revealed acute diverticulitis hospitalist team is called to admit the patient. Patient is reporting 7 out of 10 lower abdominal pain  #Acute lower abdominal pain with lower  GI bleed secondary to acute diverticulitis. Lactic acid level was normal, unlikely ischemic colitis per surgery, status post embolization of Shawndrea Rutkowski by Dr. Lucky Cowboy. White blood cell count has improved, bleeding has stopped, hemoglobin level remains stable-Levo Floxin and Flagyl -Pain management as needed with IV Dilaudid  -Nothing by mouth and IV fluids   #Acute posthemorrhagic anemia, GI bleeding scan was positive for active bleeding. Status post embolization of IMA by  vascular surgery Dr. Lucky Cowboy on 08/16/2016 -Gen. surgery  Dr. Hampton Abbot saw patient in   consultation and recommended colectomy if bleeding is uncontrollable following embolization, he is actively following . Patient had history of colectomy in the past for acute diverticulitis -Status post 4 units of blood transfusion. Continue to monitor hemoglobin and hematocrit every 6-8 hours and repeat a.m. labs  -Levo Floxin and Flagyl -Pain management as needed with IV Dilaudid  -Nothing by mouth and IV fluids -Vascular and gastroenterology signed off at this time  -Hold aspirin and Effient have home medications   abdominal x-ray no abnormality, no perforation  #Insulin dependen diabetes mellitus Currently patient is nothing by mouth on IV fluids provided sliding scale insulin hemoglobin A1c 6.0  #History of coronary artery disease status post CABG Hold aspirin and Effient in view of active GI bleed Patient denies any chest pain at this time  #Chronic history of COPD no acute exacerbation Provide nebulizer treatments as needed basis  #Hypothyroidism continue Synthroid TSH 1.5.normal .    #Hyponatremia, continue IV fluids and follow sodium level in the morning  #. Leukocytosis, follow with therapy   All the records are reviewed and case discussed with Care Management/Social Workerr. Management plans discussed with the patient, husband and they are in agreement.  CODE STATUS: fc   TOTAL TIME TAKING CARE OF THIS PATIENT: 35  minutes.   POSSIBLE D/C IN 1-2 DAYS, DEPENDING ON CLINICAL CONDITION.  Note: This dictation was prepared with Dragon dictation along with smaller phrase technology. Any transcriptional errors that result from this process are unintentional.   Lillie Bollig M.D on 08/18/2016 at 3:52 PM  Between 7am to 6pm - Pager - [phone] After 6pm go to [email or web address] - password [email or web address] [facility]  Office  [phone]  CC: Primary care physician; Glendon Axe, MD

## 2016-08-19 LAB — CBC
HCT: 27 % — ABNORMAL LOW (ref 35.0–47.0)
Hemoglobin: 9.1 g/dL — ABNORMAL LOW (ref 12.0–16.0)
MCH: 28.3 pg (ref 26.0–34.0)
MCHC: 33.8 g/dL (ref 32.0–36.0)
MCV: 83.6 fL (ref 80.0–100.0)
Platelets: 121 10*3/uL — ABNORMAL LOW (ref 150–440)
RBC: 3.23 MIL/uL — ABNORMAL LOW (ref 3.80–5.20)
RDW: 15.7 % — ABNORMAL HIGH (ref 11.5–14.5)
WBC: 11.7 10*3/uL — ABNORMAL HIGH (ref 3.6–11.0)

## 2016-08-19 LAB — URINALYSIS, COMPLETE (UACMP) WITH MICROSCOPIC
BILIRUBIN URINE: NEGATIVE
Bacteria, UA: NONE SEEN
Glucose, UA: NEGATIVE mg/dL
KETONES UR: NEGATIVE mg/dL
NITRITE: NEGATIVE
Protein, ur: NEGATIVE mg/dL
Specific Gravity, Urine: 1.012 (ref 1.005–1.030)
pH: 6 (ref 5.0–8.0)

## 2016-08-19 LAB — GASTROINTESTINAL PANEL BY PCR, STOOL (REPLACES STOOL CULTURE)
ADENOVIRUS F40/41: NOT DETECTED
Astrovirus: NOT DETECTED
CAMPYLOBACTER SPECIES: NOT DETECTED
CRYPTOSPORIDIUM: NOT DETECTED
Cyclospora cayetanensis: NOT DETECTED
ENTEROTOXIGENIC E COLI (ETEC): NOT DETECTED
Entamoeba histolytica: NOT DETECTED
Enteroaggregative E coli (EAEC): NOT DETECTED
Enteropathogenic E coli (EPEC): NOT DETECTED
Giardia lamblia: NOT DETECTED
Norovirus GI/GII: NOT DETECTED
PLESIMONAS SHIGELLOIDES: NOT DETECTED
ROTAVIRUS A: NOT DETECTED
SHIGA LIKE TOXIN PRODUCING E COLI (STEC): NOT DETECTED
SHIGELLA/ENTEROINVASIVE E COLI (EIEC): NOT DETECTED
Salmonella species: NOT DETECTED
Sapovirus (I, II, IV, and V): NOT DETECTED
Vibrio cholerae: NOT DETECTED
Vibrio species: NOT DETECTED
YERSINIA ENTEROCOLITICA: NOT DETECTED

## 2016-08-19 LAB — GLUCOSE, CAPILLARY
GLUCOSE-CAPILLARY: 156 mg/dL — AB (ref 65–99)
Glucose-Capillary: 118 mg/dL — ABNORMAL HIGH (ref 65–99)
Glucose-Capillary: 108 mg/dL — ABNORMAL HIGH (ref 65–99)

## 2016-08-19 LAB — HEMOGLOBIN A1C
Hgb A1c MFr Bld: 6 % — ABNORMAL HIGH (ref 4.8–5.6)
MEAN PLASMA GLUCOSE: 126 mg/dL

## 2016-08-19 LAB — PROTIME-INR
INR: 1.24
Prothrombin Time: 15.7 seconds — ABNORMAL HIGH (ref 11.4–15.2)

## 2016-08-19 LAB — INFLUENZA PANEL BY PCR (TYPE A & B)
Influenza A By PCR: NEGATIVE
Influenza B By PCR: NEGATIVE

## 2016-08-19 LAB — C DIFFICILE QUICK SCREEN W PCR REFLEX
C DIFFICILE (CDIFF) INTERP: NOT DETECTED
C Diff antigen: NEGATIVE
C Diff toxin: NEGATIVE

## 2016-08-19 LAB — SODIUM: SODIUM: 134 mmol/L — AB (ref 135–145)

## 2016-08-19 NOTE — Consult Note (Signed)
GI Inpatient Follow-up Note  Patient Identification: SANDEE BERNATH is a 71 y.o. female LGI bleed from sigmoid diverticuli and diverticulitis. Now stable without bleeding and trending down WBC (26.8 to 11.7) after IV antibiotics and IMA embolization.  Reports some diarrhea.  Subjective:  Scheduled Inpatient Medications:  . FLUoxetine  40 mg Oral BID  . insulin aspart  0-15 Units Subcutaneous Q6H  . insulin detemir  15 Units Subcutaneous BID  . levofloxacin (LEVAQUIN) IV  750 mg Intravenous Q24H  . levothyroxine  150 mcg Oral QAC breakfast  . metronidazole  500 mg Intravenous Q8H  . pantoprazole  40 mg Oral Daily  . traZODone  200 mg Oral QHS    Continuous Inpatient Infusions:   . sodium chloride 100 mL/hr at 08/19/16 0905    PRN Inpatient Medications:  acetaminophen **OR** acetaminophen, albuterol, HYDROmorphone (DILAUDID) injection, menthol-cetylpyridinium, nitroGLYCERIN, ondansetron **OR** ondansetron (ZOFRAN) IV, promethazine, traMADol  Review of Systems:  Constitutional: Weight is stable.  Eyes: No changes in vision. ENT: No oral lesions, sore throat.  GI: LLQ tenderness Heme/Lymph: No easy bruising.  CV: No chest pain.  GU: No hematuria.  Integumentary: No rashes.  Neuro: No headaches.  Psych: No depression/anxiety.  Endocrine: No heat/cold intolerance.  Allergic/Immunologic: No urticaria.  Resp: No cough, SOB.  Musculoskeletal: No joint swelling.    Physical Examination: BP (!) 116/48   Pulse 62   Temp 98 F (36.7 C) (Oral)   Resp 17   Ht 5\' 6"  (1.676 m)   Wt 91.7 kg (202 lb 3.2 oz)   SpO2 100%   BMI 32.64 kg/m  Gen: NAD, alert and oriented x 4 HEENT: PEERLA, EOMI, Neck: supple, no JVD or thyromegaly Chest: CTA bilaterally, no wheezes, crackles, or other adventitious sounds CV: RRR, no m/g/c/r Abd: soft, NT, ND, +BS in all four quadrants; no HSM, guarding, ridigity, or rebound tenderness Ext: no edema Skin: no rash or lesions noted   Data: Lab  Results  Component Value Date   WBC 11.7 (H) 08/19/2016   HGB 9.1 (L) 08/19/2016   HCT 27.0 (L) 08/19/2016   MCV 83.6 08/19/2016   PLT 121 (L) 08/19/2016    Recent Labs Lab 08/17/16 2132 08/18/16 0528 08/19/16 0429  HGB 10.9* 10.6* 9.1*   Lab Results  Component Value Date   NA 134 (L) 08/19/2016   K 4.2 08/18/2016   CL 101 08/18/2016   CO2 25 08/18/2016   BUN 23 (H) 08/18/2016   CREATININE 0.92 08/18/2016   Lab Results  Component Value Date   ALT 14 08/17/2016   AST 25 08/17/2016   ALKPHOS 64 08/17/2016   BILITOT 0.6 08/17/2016    Recent Labs Lab 08/19/16 1054  INR 1.24    Assessment/Plan: Ms. Clodfelter is a 71 y.o. female diverticulitis with diverticular bleed. Reports some diarrhea  Recommendations:  WBC improved. Continue IV antibiotics. Complains of diarrhea. Obtain stool studies.  No role for colonoscopy at this time. If develops fever or WBC increases will need repeat CT scan.   Please call with questions or concerns.  San Jetty, MD

## 2016-08-19 NOTE — Progress Notes (Signed)
CC: Diverticulitis Subjective: Patient reports feeling somewhat better today. Still has intermittent left lower quadrant pains but currently is without pain. Has had bowel function without blood per rectum.  Objective: Vital signs in last 24 hours: Temp:  [97.5 F (36.4 C)-100.7 F (38.2 C)] 100.7 F (38.2 C) (03/18 0522) Pulse Rate:  [76-83] 76 (03/18 0522) Resp:  [16-18] 18 (03/18 0522) BP: (102-120)/(50-68) 111/50 (03/18 0522) SpO2:  [94 %-98 %] 96 % (03/18 0522) Weight:  [91.7 kg (202 lb 3.2 oz)] 91.7 kg (202 lb 3.2 oz) (03/17 1623) Last BM Date: 08/19/16  Intake/Output from previous day: 03/17 0701 - 03/18 0700 In: 2235 [P.O.:90; I.V.:1545; IV Piggyback:600] Out: -  Intake/Output this shift: Total I/O In: 488.3 [P.O.:120; I.V.:368.3] Out: 300 [Urine:300]  Physical exam:  Gen.: No acute distress Chest: Clear to auscultation Heart: Regular rhythm Abdomen: Soft, nontender, nondistended  Lab Results: CBC   Recent Labs  08/18/16 0528 08/19/16 0429  WBC 17.8* 11.7*  HGB 10.6* 9.1*  HCT 31.2* 27.0*  PLT 140* 121*   BMET  Recent Labs  08/17/16 0722 08/18/16 0528 08/19/16 0429  NA 132* 134* 134*  K 4.8 4.2  --   CL 103 101  --   CO2 23 25  --   GLUCOSE 364* 192*  --   BUN 24* 23*  --   CREATININE 1.06* 0.92  --   CALCIUM 6.8* 7.4*  --    PT/INR  Recent Labs  08/19/16 1054  LABPROT 15.7*  INR 1.24   ABG No results for input(s): PHART, HCO3 in the last 72 hours.  Invalid input(s): PCO2, PO2  Studies/Results: No results found.  Anti-infectives: Anti-infectives    Start     Dose/Rate Route Frequency Ordered Stop   08/17/16 1000  levofloxacin (LEVAQUIN) IVPB 750 mg     750 mg 100 mL/hr over 90 Minutes Intravenous Every 24 hours 08/16/16 1129     08/16/16 1745  ceFAZolin (ANCEF) IVPB 1 g/50 mL premix     1 g 100 mL/hr over 30 Minutes Intravenous  Once 08/16/16 1740 08/16/16 1810   08/16/16 1130  levofloxacin (LEVAQUIN) IVPB 750 mg     750  mg 100 mL/hr over 90 Minutes Intravenous  Once 08/16/16 1129 08/16/16 1534   08/16/16 1100  metroNIDAZOLE (FLAGYL) IVPB 500 mg     500 mg 100 mL/hr over 60 Minutes Intravenous Every 8 hours 08/16/16 1058     08/16/16 0915  piperacillin-tazobactam (ZOSYN) IVPB 3.375 g     3.375 g 100 mL/hr over 30 Minutes Intravenous  Once 08/16/16 9371 08/16/16 6967      Assessment/Plan:  71 year old female on the medicine service with recent history of bleeding diverticulitis. Improved since vascular intervention. Discussed with the patient starting a clear liquid diet today. Discussed any worsening pain or any signs of return of bleeding per rectum she needs stop eating immediately. She voiced understanding. Urge ambulation and incentive spirometer usage.  Steward Sames T. Adonis Huguenin, MD, [facility] General Surgeon [facility]  Day [facility] [phone] Night [facility] [phone] 08/19/2016

## 2016-08-19 NOTE — Progress Notes (Signed)
[facility] [place] NAME: [name]    MR#:  [id number]  DATE OF BIRTH:  11/15/1945  SUBJECTIVE:  CHIEF COMPLAINT:  Patient Is better overall, less abdominal pain, continues to have diarrheal stool, brown in color, no black stool, no bleeding. White cell count has improved. Patient was initiated on clear liquid diet. She feels relatively good.  REVIEW OF SYSTEMS:  CONSTITUTIONAL: No fever, fatigue or weakness.  EYES: No blurred or double vision.  EARS, NOSE, AND THROAT: No tinnitus or ear pain.  RESPIRATORY: No cough, shortness of breath, wheezing or hemoptysis.  CARDIOVASCULAR: No chest pain, orthopnea, edema.  GASTROINTESTINAL: No nausea, vomiting, diarrhea  a. Reporting lower abdominal pain, tenesmus.  GENITOURINARY: No dysuria, hematuria.  ENDOCRINE: No polyuria, nocturia,  HEMATOLOGY: No anemia, easy bruising or bleeding SKIN: No rash or lesion. MUSCULOSKELETAL: No joint pain or arthritis.   NEUROLOGIC: No tingling, numbness, weakness.  PSYCHIATRY: No anxiety or depression.   DRUG ALLERGIES:   Allergies  Allergen Reactions  . Amitriptyline Other (See Comments)    Unknown reaction  . Benadryl [Diphenhydramine] Shortness Of Breath  . Demerol [Meperidine] Other (See Comments)    Unknown reaction  . Gabapentin Other (See Comments)    Unknown reaction  . Loratadine Other (See Comments)    Unknown reaction  . Meperidine Hcl Other (See Comments)    Unknown reaction  . Mirtazapine Other (See Comments)    Unknown reaction  . Olanzapine Other (See Comments)    Unknown reaction   . Voltaren [Diclofenac Sodium] Shortness Of Breath  . Zetia [Ezetimibe] Other (See Comments)    Weakness in legs, shakiness all over  . Ativan [Lorazepam] Other (See Comments)    Causes double vision at highter than .5 mg dose  . Atorvastatin Other (See Comments)    Muscle aches and weakness  . Budesonide-Formoterol Fumarate Other (See  Comments)    Shakiness, tremors  . Bupropion Hcl Other (See Comments)    "cloud over me" depression  . Caffeine Other (See Comments)    jitters  . Codeine Sulfate Other (See Comments)    Makes chest hurt like a heart attack  . Lisinopril Cough  . Metformin Nausea And Vomiting  . Mometasone Furoate Nausea And Vomiting  . Morphine Sulfate Other (See Comments)    Chest pain like a heart attack  . Other Other (See Comments)    Beta Blockers, reaction shortness of breath  . Oxycodone-Acetaminophen Nausea And Vomiting  . Pioglitazone Other (See Comments)    Cannot take because of risk of bladder cancer  . Propoxyphene N-Acetaminophen Nausea And Vomiting  . Rosuvastatin Other (See Comments)    Muscle aches and weakness  . Shellfish Allergy Diarrhea  . Tramadol Nausea Only  . Venlafaxine Other (See Comments)    Unknown reaction  . Zolpidem Tartrate Other (See Comments)     Jittery, diarrhea  . Latex Rash    VITALS:  Blood pressure (!) 116/48, pulse 62, temperature 98 F (36.7 C), temperature source Oral, resp. rate 17, height 5\' 6"  (1.676 m), weight 91.7 kg (202 lb 3.2 oz), SpO2 100 %.  PHYSICAL EXAMINATION:  GENERAL:  71 y.o.-year-old patient lying in the bed with no acute distress.  EYES: Pupils equal, round, reactive to light and accommodation. No scleral icterus. Extraocular muscles intact.  HEENT: Head atraumatic, normocephalic. Oropharynx and nasopharynx clear.  NECK:  Supple, no jugular venous distention. No thyroid enlargement, no tenderness.  LUNGS: Normal  breath sounds bilaterally, no wheezing, rales,rhonchi or crepitation. No use of accessory muscles of respiration.  CARDIOVASCULAR: S1, S2 normal. No murmurs, rubs, or gallops.  ABDOMEN: Soft,Lower abdominal tenderness is present no rebound tenderness some distention is present  Bowel sounds present. No organomegaly or mass.  EXTREMITIES: No pedal edema, cyanosis, or clubbing.  NEUROLOGIC: Cranial nerves II through XII  are intact. Muscle strength 5/5 in all extremities. Sensation intact. Gait not checked.  PSYCHIATRIC: The patient is alert and oriented x 3.  SKIN: No obvious rash, lesion, or ulcer.    LABORATORY PANEL:   CBC  Recent Labs Lab 08/19/16 0429  WBC 11.7*  HGB 9.1*  HCT 27.0*  PLT 121*   ------------------------------------------------------------------------------------------------------------------  Chemistries   Recent Labs Lab 08/17/16 0722 08/18/16 0528 08/19/16 0429  NA 132* 134* 134*  K 4.8 4.2  --   CL 103 101  --   CO2 23 25  --   GLUCOSE 364* 192*  --   BUN 24* 23*  --   CREATININE 1.06* 0.92  --   CALCIUM 6.8* 7.4*  --   AST 25  --   --   ALT 14  --   --   ALKPHOS 64  --   --   BILITOT 0.6  --   --    ------------------------------------------------------------------------------------------------------------------  Cardiac Enzymes No results for input(s): TROPONINI in the last 168 hours. ------------------------------------------------------------------------------------------------------------------  RADIOLOGY:  No results found.  EKG:   Orders placed or performed in visit on 06/28/16  . EKG 12-Lead    ASSESSMENT AND PLAN:   [name]  is a 71 y.o. female with a known history of Coronary artery disease status post CABG, COPD, diabetes mellitus, hypothyroidism, history of a diverticulitis in the past status post partial colectomy for the same and other medical problems is presenting to the ED with a chief complaint of lower abdominal pain and bleeding per rectum started at around 3 AM today. Denies any fever. CT abdomen in the ED has revealed acute diverticulitis hospitalist team is called to admit the patient. Patient is reporting 7 out of 10 lower abdominal pain  #Acute lower abdominal pain with lower GI bleed secondary to acute diverticulitis. Lactic acid level was normal, unlikely ischemic colitis per surgery, status post embolization of IMA by  Dr. Lucky Cowboy. White blood cell count has improved, bleeding has stopped, hemoglobin level remains stable-Levo Floxin and Flagyl  to be continued -Pain management as needed with IV Dilaudid  -Now on clear liquid diet and IV fluids   #Acute posthemorrhagic anemia, GI bleeding scan was positive for active bleeding. Status post embolization of IMA by  vascular surgery Dr. Lucky Cowboy on 08/16/2016 -Gen. surgery  Dr. Hampton Abbot saw patient in  consultation and recommended colectomy if bleeding is uncontrollable following embolization, surgeon is actively following . Patient had history of colectomy in the past for acute diverticulitis -Status post 4 units of blood transfusion. Continue to monitor hemoglobin and hematocrit every 6-8 hours and repeat a.m. labs  -Levo Floxin and Flagyl -Pain management as needed with IV Dilaudid  -Clear liquid diet and IV fluids -Vascular and gastroenterology signed off at this time  -Hold aspirin and Effient have home medications   abdominal x-ray no abnormality, no perforation  #Insulin dependen diabetes mellitus Continue clear liquid diet and IV fluids , sliding scale insulin hemoglobin A1c 6.0  #History of coronary artery disease status post CABG Hold aspirin and Effient in view of active GI bleed Patient denies any  chest pain at this time  #Chronic history of COPD no acute exacerbation Provide nebulizer treatments as needed basis  #Hypothyroidism continue Synthroid TSH 1.5.normal .    #Hyponatremia, continue IV fluids and follow sodium level in the morning, remains stable, may need to advance IV fluid rate  #. Leukocytosis, improved with therapy  # Fever of unclear etiology at this time, influenza test was negative, urinalysis is pending, getting C. difficile, gastrointestinal panel to rule out other kind of infection. May need to change antibiotic therapy and get CT of abdomen and pelvis if recurrent fevers    All the records are reviewed and case discussed  with Care Management/Social Workerr. Management plans discussed with the patient, husband and they are in agreement.  CODE STATUS: fc   TOTAL TIME TAKING CARE OF THIS PATIENT: 35  minutes.  Discussed this patient's husband and gastroenterologist, surgeon POSSIBLE D/C IN 1-2 DAYS, DEPENDING ON CLINICAL CONDITION.  Note: This dictation was prepared with Dragon dictation along with smaller phrase technology. Any transcriptional errors that result from this process are unintentional.   Jamara Vary M.D on 08/19/2016 at 2:08 PM  Between 7am to 6pm - Pager - [phone] After 6pm go to [email or web address] - password [email or web address] [facility]  Office  [phone]  CC: Primary care physician; Glendon Axe, MD

## 2016-08-20 LAB — CBC
HEMATOCRIT: 27.5 % — AB (ref 35.0–47.0)
Hemoglobin: 9.4 g/dL — ABNORMAL LOW (ref 12.0–16.0)
MCH: 28.8 pg (ref 26.0–34.0)
MCHC: 34.2 g/dL (ref 32.0–36.0)
MCV: 84 fL (ref 80.0–100.0)
Platelets: 128 10*3/uL — ABNORMAL LOW (ref 150–440)
RBC: 3.27 MIL/uL — ABNORMAL LOW (ref 3.80–5.20)
RDW: 15.7 % — AB (ref 11.5–14.5)
WBC: 10.7 10*3/uL (ref 3.6–11.0)

## 2016-08-20 LAB — GLUCOSE, CAPILLARY
GLUCOSE-CAPILLARY: 152 mg/dL — AB (ref 65–99)
Glucose-Capillary: 115 mg/dL — ABNORMAL HIGH (ref 65–99)
Glucose-Capillary: 117 mg/dL — ABNORMAL HIGH (ref 65–99)
Glucose-Capillary: 178 mg/dL — ABNORMAL HIGH (ref 65–99)

## 2016-08-20 LAB — URINE CULTURE: Culture: NO GROWTH

## 2016-08-20 LAB — BASIC METABOLIC PANEL
ANION GAP: 3 — AB (ref 5–15)
BUN: 11 mg/dL (ref 6–20)
CO2: 29 mmol/L (ref 22–32)
Calcium: 7.2 mg/dL — ABNORMAL LOW (ref 8.9–10.3)
Chloride: 105 mmol/L (ref 101–111)
Creatinine, Ser: 0.65 mg/dL (ref 0.44–1.00)
GFR calc non Af Amer: >60 mL/min (ref >60)
Glucose, Bld: 121 mg/dL — ABNORMAL HIGH (ref 65–99)
POTASSIUM: 3.5 mmol/L (ref 3.5–5.1)
SODIUM: 137 mmol/L (ref 135–145)

## 2016-08-20 MED ORDER — PIPERACILLIN-TAZOBACTAM 3.375 G IVPB
3.3750 g | Freq: Three times a day (TID) | INTRAVENOUS | Status: DC
  Administered 2016-08-20 – 2016-08-23 (×9): 3.375 g via INTRAVENOUS
  Filled 2016-08-20 (×10): qty 50

## 2016-08-20 MED ORDER — PHENAZOPYRIDINE HCL 100 MG PO TABS
100.0000 mg | ORAL_TABLET | Freq: Three times a day (TID) | ORAL | Status: AC
  Administered 2016-08-20 – 2016-08-23 (×7): 100 mg via ORAL
  Filled 2016-08-20 (×7): qty 1

## 2016-08-20 NOTE — Progress Notes (Signed)
[facility] [facility] NAME: [name]    MR#:  [id number]  DATE OF BIRTH:  March 30, 1946  SUBJECTIVE:  CHIEF COMPLAINT:  Patient Is better overall, less abdominal pain, continues to have diarrheal stool, brown in color, no black stool, no bleeding. White cell count has improved. Patient Is being continued on clear liquid diet, continues to have high fever. Antibiotic is changed to Zosyn. Urine cultures are pending, stool cultures are negative for C. difficile and enteric pathogens. May need to repeat a CT of abdomen and pelvis if fevers do not resolve. Blood cultures are taken. Influenza test is negative. Patient feels satisfactory today, denies significant pain, has some gassy pain in the left side of the abdomen, some loose stools, improved from before REVIEW OF SYSTEMS:  CONSTITUTIONAL: No fever, fatigue or weakness.  EYES: No blurred or double vision.  EARS, NOSE, AND THROAT: No tinnitus or ear pain.  RESPIRATORY: No cough, shortness of breath, wheezing or hemoptysis.  CARDIOVASCULAR: No chest pain, orthopnea, edema.  GASTROINTESTINAL: No nausea, vomiting, And loose stools Reporting left-sided abdominal pain intermittently, not related to diet, defecation.  GENITOURINARY: Some dysuria, no hematuria.  ENDOCRINE: No polyuria, nocturia,  HEMATOLOGY: No anemia, easy bruising or bleeding SKIN: No rash or lesion. MUSCULOSKELETAL: No joint pain or arthritis.   NEUROLOGIC: No tingling, numbness, weakness.  PSYCHIATRY: No anxiety or depression.   DRUG ALLERGIES:   Allergies  Allergen Reactions  . Amitriptyline Other (See Comments)    Unknown reaction  . Benadryl [Diphenhydramine] Shortness Of Breath  . Demerol [Meperidine] Other (See Comments)    Unknown reaction  . Gabapentin Other (See Comments)    Unknown reaction  . Loratadine Other (See Comments)    Unknown reaction  . Meperidine Hcl Other (See Comments)    Unknown reaction  .  Mirtazapine Other (See Comments)    Unknown reaction  . Olanzapine Other (See Comments)    Unknown reaction   . Voltaren [Diclofenac Sodium] Shortness Of Breath  . Zetia [Ezetimibe] Other (See Comments)    Weakness in legs, shakiness all over  . Ativan [Lorazepam] Other (See Comments)    Causes double vision at highter than .5 mg dose  . Atorvastatin Other (See Comments)    Muscle aches and weakness  . Budesonide-Formoterol Fumarate Other (See Comments)    Shakiness, tremors  . Bupropion Hcl Other (See Comments)    "cloud over me" depression  . Caffeine Other (See Comments)    jitters  . Codeine Sulfate Other (See Comments)    Makes chest hurt like a heart attack  . Lisinopril Cough  . Metformin Nausea And Vomiting  . Mometasone Furoate Nausea And Vomiting  . Morphine Sulfate Other (See Comments)    Chest pain like a heart attack  . Other Other (See Comments)    Beta Blockers, reaction shortness of breath  . Oxycodone-Acetaminophen Nausea And Vomiting  . Pioglitazone Other (See Comments)    Cannot take because of risk of bladder cancer  . Propoxyphene N-Acetaminophen Nausea And Vomiting  . Rosuvastatin Other (See Comments)    Muscle aches and weakness  . Shellfish Allergy Diarrhea  . Tramadol Nausea Only  . Venlafaxine Other (See Comments)    Unknown reaction  . Zolpidem Tartrate Other (See Comments)     Jittery, diarrhea  . Latex Rash    VITALS:  Blood pressure (!) 138/55, pulse 70, temperature 99.7 F (37.6 C), temperature source Oral, resp. rate 18,  height 5\' 6"  (1.676 m), weight 91.7 kg (202 lb 3.2 oz), SpO2 97 %.  PHYSICAL EXAMINATION:  GENERAL:  71 y.o.-year-old patient lying in the bed with no acute distress.  EYES: Pupils equal, round, reactive to light and accommodation. No scleral icterus. Extraocular muscles intact.  HEENT: Head atraumatic, normocephalic. Oropharynx and nasopharynx clear.  NECK:  Supple, no jugular venous distention. No thyroid  enlargement, no tenderness.  LUNGS: Normal breath sounds bilaterally, no wheezing, rales,rhonchi or crepitation. No use of accessory muscles of respiration.  CARDIOVASCULAR: S1, S2 normal. No murmurs, rubs, or gallops.  ABDOMEN: Soft,Left-sided abdominal discomfort on palpation, no rebound tenderness   Bowel sounds are active. No organomegaly or mass.  EXTREMITIES: No pedal edema, cyanosis, or clubbing.  NEUROLOGIC: Cranial nerves II through XII are intact. Muscle strength 5/5 in all extremities. Sensation intact. Gait not checked.  PSYCHIATRIC: The patient is alert and oriented x 3.  SKIN: No obvious rash, lesion, or ulcer.    LABORATORY PANEL:   CBC  Recent Labs Lab 08/20/16 0334  WBC 10.7  HGB 9.4*  HCT 27.5*  PLT 128*   ------------------------------------------------------------------------------------------------------------------  Chemistries   Recent Labs Lab 08/17/16 0722  08/20/16 0334  NA 132*  < > 137  K 4.8  < > 3.5  CL 103  < > 105  CO2 23  < > 29  GLUCOSE 364*  < > 121*  BUN 24*  < > 11  CREATININE 1.06*  < > 0.65  CALCIUM 6.8*  < > 7.2*  AST 25  --   --   ALT 14  --   --   ALKPHOS 64  --   --   BILITOT 0.6  --   --   < > = values in this interval not displayed. ------------------------------------------------------------------------------------------------------------------  Cardiac Enzymes No results for input(s): TROPONINI in the last 168 hours. ------------------------------------------------------------------------------------------------------------------  RADIOLOGY:  No results found.  EKG:   Orders placed or performed in visit on 06/28/16  . EKG 12-Lead    ASSESSMENT AND PLAN:   [name]  is a 71 y.o. female with a known history of Coronary artery disease status post CABG, COPD, diabetes mellitus, hypothyroidism, history of a diverticulitis in the past status post partial colectomy for the same and other medical problems is  presenting to the ED with a chief complaint of lower abdominal pain and bleeding per rectum started at around 3 AM today. Denies any fever. CT abdomen in the ED has revealed acute diverticulitis hospitalist team is called to admit the patient. Patient is reporting 7 out of 10 lower abdominal pain  #Acute lower abdominal pain with lower GI bleed secondary to acute diverticulitis. Lactic acid level was normal, unlikely ischemic colitis per surgery, status post embolization of IMA by Dr. Lucky Cowboy. White blood cell count has improved, bleeding has stopped, hemoglobin level remains stable, But patient continues to have a high fever, change antibiotic to Zosyn, following closely, repeat CT scan of abdomen and pelvis with contrast if fevers do not subside with current management. Clear liquid diet. Appreciate gastroenterology and surgery input.  -Pain management as needed with IV Dilaudid pump is sensitive to other by mouth medications   #Acute posthemorrhagic anemia, GI bleeding scan was positive for active bleeding. Status post embolization of IMA by  vascular surgery Dr. Lucky Cowboy on 08/16/2016 -Gen. surgery  Dr. Hampton Abbot saw patient in  consultation and recommended colectomy if bleeding is uncontrollable following embolization, surgeon is actively following . Patient had history  of colectomy in the past for acute diverticulitis -Status post 4 units of blood transfusion. Continue to monitor hemoglobin and hematocrit daily-Vascular and gastroenterology signed off at this time. Hold aspirin and Effient   abdominal x-ray no abnormality, no perforation  #Insulin dependen diabetes mellitus Continue clear liquid diet and IV fluids , sliding scale insulin hemoglobin A1c 6.0, blood glucose levels ranging between 108-150  #History of coronary artery disease status post CABG Hold aspirin and Effient in view of active GI bleed Patient denies any chest pain at this time  #Chronic history of COPD , some wheezing,  discontinuing IV fluids Provide nebulizer treatments as needed basis  #Hypothyroidism continue Synthroid TSH 1.5.normal .    #Hyponatremia, sodium level has improved with IV fluid administration, discontinuing IV fluids, follow closely as patient still has some diarrheal stool  #. Leukocytosis, resolved with therapy  # Fever of unclear etiology at this time, influenza test was negative, urinalysis was remarkable for elevated white blood cell count, urine cultures pending ,  C. difficile, gastrointestinal panel was unremarkable. May need to repeat CT scan of abdomen and pelvis if fevers or recurrent.    All the records are reviewed and case discussed with Care Management/Social Workerr. Management plans discussed with the patient, husband and they are in agreement.  CODE STATUS: fc   TOTAL TIME TAKING CARE OF THIS PATIENT: 35  minutes.   POSSIBLE D/C IN 1-2 DAYS, DEPENDING ON CLINICAL CONDITION.  Note: This dictation was prepared with Dragon dictation along with smaller phrase technology. Any transcriptional errors that result from this process are unintentional.   Edilson Vital M.D on 08/20/2016 at 2:29 PM  Between 7am to 6pm - Pager - [phone] After 6pm go to [email or web address] - password [email or web address] [facility]  Office  [phone]  CC: Primary care physician; Glendon Axe, MD

## 2016-08-20 NOTE — Progress Notes (Signed)
CC: diverticulitis w diverticular bleed s/p embolization Subjective: Feeling better, some abd pain, taking clears, HB stable , some dark stool this am but no active bleeding.  Objective: Vital signs in last 24 hours: Temp:  [99.7 F (37.6 C)-100.9 F (38.3 C)] 100.4 F (38 C) (03/19 1655) Pulse Rate:  [68-70] 69 (03/19 1655) Resp:  [17-18] 17 (03/19 1655) BP: (102-138)/(43-55) 114/46 (03/19 1655) SpO2:  [91 %-97 %] 91 % (03/19 1655) Last BM Date: 08/19/16  Intake/Output from previous day: 03/18 0701 - 03/19 0700 In: 2846 [P.O.:120; I.V.:2276; IV Piggyback:450] Out: 300 [Urine:300] Intake/Output this shift: Total I/O In: 1886 [P.O.:1520; I.V.:366] Out: -   Physical exam: Obese female in NAD Abd: soft, Mild ttp LLQ, no peritonitis Ext: well perfused, no edema and warm  Lab Results: CBC   Recent Labs  08/19/16 0429 08/20/16 0334  WBC 11.7* 10.7  HGB 9.1* 9.4*  HCT 27.0* 27.5*  PLT 121* 128*   BMET  Recent Labs  08/18/16 0528 08/19/16 0429 08/20/16 0334  NA 134* 134* 137  K 4.2  --  3.5  CL 101  --  105  CO2 25  --  29  GLUCOSE 192*  --  121*  BUN 23*  --  11  CREATININE 0.92  --  0.65  CALCIUM 7.4*  --  7.2*   PT/INR  Recent Labs  08/19/16 1054  LABPROT 15.7*  INR 1.24   ABG No results for input(s): PHART, HCO3 in the last 72 hours.  Invalid input(s): PCO2, PO2  Studies/Results: No results found.  Anti-infectives: Anti-infectives    Start     Dose/Rate Route Frequency Ordered Stop   08/20/16 0930  piperacillin-tazobactam (ZOSYN) IVPB 3.375 g     3.375 g 12.5 mL/hr over 240 Minutes Intravenous Every 8 hours 08/20/16 0917     08/17/16 1000  levofloxacin (LEVAQUIN) IVPB 750 mg  Status:  Discontinued     750 mg 100 mL/hr over 90 Minutes Intravenous Every 24 hours 08/16/16 1129 08/20/16 0735   08/16/16 1745  ceFAZolin (ANCEF) IVPB 1 g/50 mL premix     1 g 100 mL/hr over 30 Minutes Intravenous  Once 08/16/16 1740 08/16/16 1810   08/16/16  1130  levofloxacin (LEVAQUIN) IVPB 750 mg     750 mg 100 mL/hr over 90 Minutes Intravenous  Once 08/16/16 1129 08/16/16 1534   08/16/16 1100  metroNIDAZOLE (FLAGYL) IVPB 500 mg  Status:  Discontinued     500 mg 100 mL/hr over 60 Minutes Intravenous Every 8 hours 08/16/16 1058 08/20/16 0735   08/16/16 0915  piperacillin-tazobactam (ZOSYN) IVPB 3.375 g     3.375 g 100 mL/hr over 30 Minutes Intravenous  Once 08/16/16 6195 08/16/16 0932      Assessment/Plan: Diverticulitis w diverticular bleed. Recommend continuation w antibiotic therapy. Benign abdomen no need for emergent intervention at this time. Needs to be optimized from cardiac and medical perspective before any surgical intervention  Is attempted. We will continue to follow. Caroleen Hamman, MD, [facility]  08/20/2016

## 2016-08-21 ENCOUNTER — Encounter: Payer: Self-pay | Admitting: Radiology

## 2016-08-21 ENCOUNTER — Inpatient Hospital Stay: Payer: Medicare Other

## 2016-08-21 LAB — GLUCOSE, CAPILLARY
GLUCOSE-CAPILLARY: 159 mg/dL — AB (ref 65–99)
GLUCOSE-CAPILLARY: 319 mg/dL — AB (ref 65–99)
Glucose-Capillary: 116 mg/dL — ABNORMAL HIGH (ref 65–99)
Glucose-Capillary: 133 mg/dL — ABNORMAL HIGH (ref 65–99)

## 2016-08-21 LAB — HEPARIN INDUCED PLATELET AB (HIT ANTIBODY): Heparin Induced Plt Ab: 0.19 OD (ref 0.000–0.400)

## 2016-08-21 MED ORDER — HYDROMORPHONE HCL 2 MG PO TABS
1.0000 mg | ORAL_TABLET | ORAL | Status: DC | PRN
  Administered 2016-08-21 – 2016-08-22 (×4): 1 mg via ORAL
  Filled 2016-08-21 (×4): qty 1

## 2016-08-21 MED ORDER — SALINE SPRAY 0.65 % NA SOLN
1.0000 | NASAL | Status: DC | PRN
  Administered 2016-08-22: 1 via NASAL
  Filled 2016-08-21: qty 44

## 2016-08-21 MED ORDER — IOPAMIDOL (ISOVUE-300) INJECTION 61%
15.0000 mL | INTRAVENOUS | Status: AC
  Administered 2016-08-21 (×2): 15 mL via ORAL

## 2016-08-21 MED ORDER — FUROSEMIDE 10 MG/ML IJ SOLN
20.0000 mg | Freq: Once | INTRAMUSCULAR | Status: AC
  Administered 2016-08-21: 20 mg via INTRAVENOUS
  Filled 2016-08-21: qty 2

## 2016-08-21 MED ORDER — IOPAMIDOL (ISOVUE-300) INJECTION 61%
100.0000 mL | Freq: Once | INTRAVENOUS | Status: AC | PRN
  Administered 2016-08-21: 100 mL via INTRAVENOUS

## 2016-08-21 MED ORDER — HYDROMORPHONE HCL 1 MG/ML PO LIQD: 1.0000 mg | ORAL | Status: DC | PRN

## 2016-08-21 NOTE — Progress Notes (Signed)
[facility] [place] NAME: [name]    MR#:  [id number]  DATE OF BIRTH:  Sep 23, 1945  SUBJECTIVE:  CHIEF COMPLAINT:  Patient Is better overall, less abdominal pain, continues to have diarrheal stool, brown in color, no black stool, no bleeding. White cell count has improved. Admits of having left-sided abdominal pain, requiring some pain medications, Dilaudid intravenously. No change to oral. CT scan of abdomen and pelvis, repeated, revealed improvement of diverticulitis inflammation. No abscess was noted. Patient continues to have temperature elevation intermittently to 100.4. She feels satisfactory, requests more food. Physical therapy saw patient in consultation and recommended skilled nursing facility placement for rehabilitation. REVIEW OF SYSTEMS:  CONSTITUTIONAL: Admits of fever, fatigue or weakness.  EYES: No blurred or double vision.  EARS, NOSE, AND THROAT: No tinnitus or ear pain.  RESPIRATORY: No cough, shortness of breath, wheezing or hemoptysis.  CARDIOVASCULAR: No chest pain, orthopnea, edema.  GASTROINTESTINAL: No nausea, vomiting, And loose stools Reporting left-sided abdominal pain , not related to diet, defecation.  GENITOURINARY: Some dysuria, no hematuria.  ENDOCRINE: No polyuria, nocturia,  HEMATOLOGY: No anemia, easy bruising or bleeding SKIN: No rash or lesion. MUSCULOSKELETAL: No joint pain or arthritis.   NEUROLOGIC: No tingling, numbness, weakness.  PSYCHIATRY: No anxiety or depression.   DRUG ALLERGIES:   Allergies  Allergen Reactions  . Amitriptyline Other (See Comments)    Unknown reaction  . Benadryl [Diphenhydramine] Shortness Of Breath  . Demerol [Meperidine] Other (See Comments)    Unknown reaction  . Gabapentin Other (See Comments)    Unknown reaction  . Loratadine Other (See Comments)    Unknown reaction  . Meperidine Hcl Other (See Comments)    Unknown reaction  . Mirtazapine Other (See  Comments)    Unknown reaction  . Olanzapine Other (See Comments)    Unknown reaction   . Voltaren [Diclofenac Sodium] Shortness Of Breath  . Zetia [Ezetimibe] Other (See Comments)    Weakness in legs, shakiness all over  . Ativan [Lorazepam] Other (See Comments)    Causes double vision at highter than .5 mg dose  . Atorvastatin Other (See Comments)    Muscle aches and weakness  . Budesonide-Formoterol Fumarate Other (See Comments)    Shakiness, tremors  . Bupropion Hcl Other (See Comments)    "cloud over me" depression  . Caffeine Other (See Comments)    jitters  . Codeine Sulfate Other (See Comments)    Makes chest hurt like a heart attack  . Lisinopril Cough  . Metformin Nausea And Vomiting  . Mometasone Furoate Nausea And Vomiting  . Morphine Sulfate Other (See Comments)    Chest pain like a heart attack  . Other Other (See Comments)    Beta Blockers, reaction shortness of breath  . Oxycodone-Acetaminophen Nausea And Vomiting  . Pioglitazone Other (See Comments)    Cannot take because of risk of bladder cancer  . Propoxyphene N-Acetaminophen Nausea And Vomiting  . Rosuvastatin Other (See Comments)    Muscle aches and weakness  . Shellfish Allergy Diarrhea  . Tramadol Nausea Only  . Venlafaxine Other (See Comments)    Unknown reaction  . Zolpidem Tartrate Other (See Comments)     Jittery, diarrhea  . Latex Rash    VITALS:  Blood pressure (!) 110/52, pulse 72, temperature 98.9 F (37.2 C), temperature source Oral, resp. rate 18, height 5\' 6"  (1.676 m), weight 91.7 kg (202 lb 3.2 oz), SpO2 97 %.  PHYSICAL  EXAMINATION:  GENERAL:  71 y.o.-year-old patient lying in the bed with no acute distress.  EYES: Pupils equal, round, reactive to light and accommodation. No scleral icterus. Extraocular muscles intact.  HEENT: Head atraumatic, normocephalic. Oropharynx and nasopharynx clear.  NECK:  Supple, no jugular venous distention. No thyroid enlargement, no tenderness.   LUNGS: Normal breath sounds bilaterally, no wheezing,Bilateral scattered rales,rhonchi and crepitations anteriorly and posteriorly. Intermittent use of accessory muscles of respiration.  CARDIOVASCULAR: S1, S2 normal. 3/6 systolic murmur, no  rubs, or gallops.  ABDOMEN: Soft,Left-sided abdominal discomfort on palpation, no rebound tenderness   Bowel sounds are active. No organomegaly or mass.  EXTREMITIES: No pedal edema, cyanosis, or clubbing.  NEUROLOGIC: Cranial nerves II through XII are intact. Muscle strength 5/5 in all extremities. Sensation intact. Gait not checked.  PSYCHIATRIC: The patient is alert and oriented x 3.  SKIN: No obvious rash, lesion, or ulcer.    LABORATORY PANEL:   CBC  Recent Labs Lab 08/20/16 0334  WBC 10.7  HGB 9.4*  HCT 27.5*  PLT 128*   ------------------------------------------------------------------------------------------------------------------  Chemistries   Recent Labs Lab 08/17/16 0722  08/20/16 0334  NA 132*  < > 137  K 4.8  < > 3.5  CL 103  < > 105  CO2 23  < > 29  GLUCOSE 364*  < > 121*  BUN 24*  < > 11  CREATININE 1.06*  < > 0.65  CALCIUM 6.8*  < > 7.2*  AST 25  --   --   ALT 14  --   --   ALKPHOS 64  --   --   BILITOT 0.6  --   --   < > = values in this interval not displayed. ------------------------------------------------------------------------------------------------------------------  Cardiac Enzymes No results for input(s): TROPONINI in the last 168 hours. ------------------------------------------------------------------------------------------------------------------  RADIOLOGY:  Ct Abdomen Pelvis W Contrast  Result Date: 08/21/2016 CLINICAL DATA:  Follow-up diverticulitis, persistent diarrhea and fever however improvement in abdominal pain EXAM: CT ABDOMEN AND PELVIS WITH CONTRAST TECHNIQUE: Multidetector CT imaging of the abdomen and pelvis was performed using the standard protocol following bolus administration  of intravenous contrast. CONTRAST:  138mL ISOVUE-300 IOPAMIDOL (ISOVUE-300) INJECTION 61% COMPARISON:  08/16/2016 FINDINGS: Lower chest: Lung bases shows small bilateral pleural effusion. Bilateral basilar posterior atelectasis. Small hiatal hernia is noted. Cardiomegaly. Again noted mitral valve calcifications. Hepatobiliary: There is mild fatty infiltration of the liver. No focal hepatic mass. Status post cholecystectomy. Pancreas: Enhanced pancreas is mild atrophic without focal abnormality. Spleen: Enhanced spleen without focal abnormality. Atherosclerotic calcifications of splenic artery. Adrenals/Urinary Tract: No adrenal gland mass. Enhanced kidneys are symmetrical in size. No hydronephrosis or hydroureter. No nephrolithiasis. Delayed renal images shows bilateral renal symmetrical excretion. Bilateral visualized proximal ureter is unremarkable. Stomach/Bowel: There is no small bowel obstruction. No gastric outlet obstruction. Contrast material noted throughout the colon. No pericecal inflammation.  The terminal ileum is unremarkable. Axial image 59 at the site of previous for mild diverticulitis at the junction of descending colon with sigmoid colon there is improvement with less thickening of diverticular wall. Only minimal stranding of surrounding pericolonic fat without evidence of perforation or pericolonic abscess. Axial image 48 and coronal image 46 there is nonspecific mild segmental thickening of colonic wall in the mid descending colon. Nonspecific mild segmental colitis cannot be excluded. There is no evidence of pericolonic abscess or extraluminal contrast material. Stable postsurgical changes in distal sigmoid colon without evidence of anastomotic stricture. Again noted distal sigmoid colon diverticuli without definite  evidence of new acute diverticulitis. Vascular/Lymphatic: Atherosclerotic calcifications of abdominal aorta and iliac arteries. No aortic aneurysm. Reproductive: Status post  hysterectomy. Other: No ascites or free abdominal air. No inguinal adenopathy. The urinary bladder is unremarkable. Musculoskeletal: No destructive bony lesions are noted. There is disc space flattening with vacuum disc phenomenon mild anterior mild posterior spurring at L4-L5 and L5-S1 level. IMPRESSION: 1. There is improvement at the site of previous focal diverticulitis at the junction of descending colon with sigmoid colon. Only minimal residual pericolonic inflammatory changes without evidence of residual pericolonic abscess or perforation. 2. Axial image 48 and coronal image 46 there is nonspecific mild segmental thickening of colonic wall in the mid descending colon. Nonspecific mild segmental colitis cannot be excluded. 3. Again noted distal sigmoid colon diverticuli without definite evidence of new acute diverticulitis. No distal colonic obstruction. No pericecal inflammation. No small bowel obstruction. 4. No hydronephrosis or hydroureter. 5. Status post hysterectomy. Electronically Signed   By: Lahoma Crocker M.D.   On: 08/21/2016 13:15   Dg Abd Acute W/chest  Result Date: 08/21/2016 CLINICAL DATA:  Abdominal pain. EXAM: DG ABDOMEN ACUTE W/ 1V CHEST COMPARISON:  08/17/2016. FINDINGS: Prior cardiac valve replacement. Prior CABG. Cardiomegaly with mild bilateral interstitial prominence. Mild CHF cannot be excluded. Small left pleural effusion. No pneumothorax . IMPRESSION: 1.  Prior cardiac valve replacement.  Prior CABG. 2. Cardiomegaly with mild bilateral from interstitial prominence of small left pleural effusion consistent with CHF. Electronically Signed   By: Marcello Moores  Register   On: 08/21/2016 08:29    EKG:   Orders placed or performed in visit on 06/28/16  . EKG 12-Lead    ASSESSMENT AND PLAN:   Karmin Kasprzak  is a 71 y.o. female with a known history of Coronary artery disease status post CABG, COPD, diabetes mellitus, hypothyroidism, history of a diverticulitis in the past status post partial  colectomy for the same and other medical problems is presenting to the ED with a chief complaint of lower abdominal pain and bleeding per rectum started at around 3 AM today. Denies any fever. CT abdomen in the ED has revealed acute diverticulitis hospitalist team is called to admit the patient. Patient is reporting 7 out of 10 lower abdominal pain  #Acute lower abdominal pain with lower GI bleed secondary to acute diverticulitis. Lactic acid level was normal, unlikely ischemic colitis per surgery, status post embolization of IMA by Dr. Lucky Cowboy. White blood cell count has improved, bleeding has stopped, hemoglobin level remains stable, the patient continues to have a fever,  Now on Zosyn, repeated CT scan of abdomen and pelvis with contrast showed improvement, advancing diet to full liquid diet. Appreciate gastroenterology and surgery input.  -Pain management as needed with po Dilaudid around  #Acute posthemorrhagic anemia, GI bleeding scan was positive for active bleeding. Status post embolization of IMA by  vascular surgery Dr. Lucky Cowboy on 08/16/2016 -Gen. surgery  Dr. Hampton Abbot saw patient in  consultation and recommended colectomy if bleeding is uncontrollable following embolization, surgeon is actively following . Patient had history of colectomy in the past for acute diverticulitis -Status post 4 units of blood transfusion. Continue to monitor hemoglobin intermittently . Vascular and gastroenterology signed off at this time. Holdinhg aspirin and Effient   abdominal x-ray no abnormality, no perforation  #Insulin dependen diabetes mellitus Continue clear liquid diet and IV fluids , sliding scale insulin hemoglobin A1c 6.0, blood glucose levels ranging between 110-160  #History of coronary artery disease status post CABG Hold aspirin  and Effient in view of active GI bleed Patient denies any chest pain at this time  # history of COPD , some wheezing, off IV fluids nebulizer treatments as needed  basis  #Hypothyroidism continue Synthroid TSH 1.5.normal .  # Acute diastolic CHF, initiate Lasix, echocardiogram in December 2017, ejection fraction on normal, LVH, diastolic dysfunction,aortic bioprosthesis, elevated pulmonary pressures, follow oxygenation, ins and outs, weight   #Hyponatremia, sodium level has improved with IV fluid administration, now off IV fluids, follow closely with diuresis   #. Leukocytosis, resolved with therapy  # Fever of unclear etiology at this time, influenza test was negative, urinalysis was remarkable for elevated white blood cell count, urine cultures negative,  C. difficile, gastrointestinal panel was unremarkable. Repeated CT scan of abdomen and pelvis revealed improvement. May need to get ID to evaluate the patient.   # . Generalized weakness, skilled nursing facility placement for rehabilitation was recommended by physical therapist, social worker is involved    All the records are reviewed and case discussed with Care Management/Social Workerr. Management plans discussed with the patient, husband and they are in agreement.  CODE STATUS: fc   TOTAL TIME TAKING CARE OF THIS PATIENT: 35  minutes.  Discussed with patient's husband POSSIBLE D/C IN 1-2 DAYS, DEPENDING ON CLINICAL CONDITION.  Note: This dictation was prepared with Dragon dictation along with smaller phrase technology. Any transcriptional errors that result from this process are unintentional.   Shareece Bultman M.D on 08/21/2016 at 2:57 PM  Between 7am to 6pm - Pager - [phone] After 6pm go to [email or web address] - password [email or web address] [facility]  Office  [phone]  CC: Primary care physician; Glendon Axe, MD

## 2016-08-21 NOTE — Progress Notes (Signed)
CC: Particular bleeding status post embolization , diverticulitis Subjective: Main issue is shortness of breath. His had some wheezing last night. Chest x-ray shows some pulmonary congestion and cardiomegaly. Because of low-grade temperatures and a CT scan was obtained and a half personally reviewed. There is actually improvement on the diverticulitis of the descending colon there is no free air or evidence of abscess.  Objective: Vital signs in last 24 hours: Temp:  [99.1 F (37.3 C)-100.4 F (38 C)] 100.1 F (37.8 C) (03/20 0537) Pulse Rate:  [65-70] 65 (03/20 0537) Resp:  [16-17] 16 (03/20 0537) BP: (108-126)/(46-51) 108/51 (03/20 0537) SpO2:  [91 %-96 %] 96 % (03/20 0537) Last BM Date: 08/20/16  Intake/Output from previous day: 03/19 0701 - 03/20 0700 In: 2246 [P.O.:1780; I.V.:366; IV Piggyback:100] Out: 400 [Urine:400] Intake/Output this shift: Total I/O In: 240 [P.O.:240] Out: 300 [Urine:300]  Physical exam: Dilatated and morbidly obese female chronically ill MCN:OBSJ, NT, no peritonitis Ext: well perfused, warm  Lab Results: CBC   Recent Labs  08/19/16 0429 08/20/16 0334  WBC 11.7* 10.7  HGB 9.1* 9.4*  HCT 27.0* 27.5*  PLT 121* 128*   BMET  Recent Labs  08/19/16 0429 08/20/16 0334  NA 134* 137  K  --  3.5  CL  --  105  CO2  --  29  GLUCOSE  --  121*  BUN  --  11  CREATININE  --  0.65  CALCIUM  --  7.2*   PT/INR  Recent Labs  08/19/16 1054  LABPROT 15.7*  INR 1.24   ABG No results for input(s): PHART, HCO3 in the last 72 hours.  Invalid input(s): PCO2, PO2  Studies/Results: Ct Abdomen Pelvis W Contrast  Result Date: 08/21/2016 CLINICAL DATA:  Follow-up diverticulitis, persistent diarrhea and fever however improvement in abdominal pain EXAM: CT ABDOMEN AND PELVIS WITH CONTRAST TECHNIQUE: Multidetector CT imaging of the abdomen and pelvis was performed using the standard protocol following bolus administration of intravenous contrast.  CONTRAST:  14mL ISOVUE-300 IOPAMIDOL (ISOVUE-300) INJECTION 61% COMPARISON:  08/16/2016 FINDINGS: Lower chest: Lung bases shows small bilateral pleural effusion. Bilateral basilar posterior atelectasis. Small hiatal hernia is noted. Cardiomegaly. Again noted mitral valve calcifications. Hepatobiliary: There is mild fatty infiltration of the liver. No focal hepatic mass. Status post cholecystectomy. Pancreas: Enhanced pancreas is mild atrophic without focal abnormality. Spleen: Enhanced spleen without focal abnormality. Atherosclerotic calcifications of splenic artery. Adrenals/Urinary Tract: No adrenal gland mass. Enhanced kidneys are symmetrical in size. No hydronephrosis or hydroureter. No nephrolithiasis. Delayed renal images shows bilateral renal symmetrical excretion. Bilateral visualized proximal ureter is unremarkable. Stomach/Bowel: There is no small bowel obstruction. No gastric outlet obstruction. Contrast material noted throughout the colon. No pericecal inflammation.  The terminal ileum is unremarkable. Axial image 59 at the site of previous for mild diverticulitis at the junction of descending colon with sigmoid colon there is improvement with less thickening of diverticular wall. Only minimal stranding of surrounding pericolonic fat without evidence of perforation or pericolonic abscess. Axial image 48 and coronal image 46 there is nonspecific mild segmental thickening of colonic wall in the mid descending colon. Nonspecific mild segmental colitis cannot be excluded. There is no evidence of pericolonic abscess or extraluminal contrast material. Stable postsurgical changes in distal sigmoid colon without evidence of anastomotic stricture. Again noted distal sigmoid colon diverticuli without definite evidence of new acute diverticulitis. Vascular/Lymphatic: Atherosclerotic calcifications of abdominal aorta and iliac arteries. No aortic aneurysm. Reproductive: Status post hysterectomy. Other: No ascites  or free abdominal  air. No inguinal adenopathy. The urinary bladder is unremarkable. Musculoskeletal: No destructive bony lesions are noted. There is disc space flattening with vacuum disc phenomenon mild anterior mild posterior spurring at L4-L5 and L5-S1 level. IMPRESSION: 1. There is improvement at the site of previous focal diverticulitis at the junction of descending colon with sigmoid colon. Only minimal residual pericolonic inflammatory changes without evidence of residual pericolonic abscess or perforation. 2. Axial image 48 and coronal image 46 there is nonspecific mild segmental thickening of colonic wall in the mid descending colon. Nonspecific mild segmental colitis cannot be excluded. 3. Again noted distal sigmoid colon diverticuli without definite evidence of new acute diverticulitis. No distal colonic obstruction. No pericecal inflammation. No small bowel obstruction. 4. No hydronephrosis or hydroureter. 5. Status post hysterectomy. Electronically Signed   By: Lahoma Crocker M.D.   On: 08/21/2016 13:15   Dg Abd Acute W/chest  Result Date: 08/21/2016 CLINICAL DATA:  Abdominal pain. EXAM: DG ABDOMEN ACUTE W/ 1V CHEST COMPARISON:  08/17/2016. FINDINGS: Prior cardiac valve replacement. Prior CABG. Cardiomegaly with mild bilateral interstitial prominence. Mild CHF cannot be excluded. Small left pleural effusion. No pneumothorax . IMPRESSION: 1.  Prior cardiac valve replacement.  Prior CABG. 2. Cardiomegaly with mild bilateral from interstitial prominence of small left pleural effusion consistent with CHF. Electronically Signed   By: Marcello Moores  Register   On: 08/21/2016 08:29    Anti-infectives: Anti-infectives    Start     Dose/Rate Route Frequency Ordered Stop   08/20/16 0930  piperacillin-tazobactam (ZOSYN) IVPB 3.375 g     3.375 g 12.5 mL/hr over 240 Minutes Intravenous Every 8 hours 08/20/16 0917     08/17/16 1000  levofloxacin (LEVAQUIN) IVPB 750 mg  Status:  Discontinued     750 mg 100 mL/hr  over 90 Minutes Intravenous Every 24 hours 08/16/16 1129 08/20/16 0735   08/16/16 1745  ceFAZolin (ANCEF) IVPB 1 g/50 mL premix     1 g 100 mL/hr over 30 Minutes Intravenous  Once 08/16/16 1740 08/16/16 1810   08/16/16 1130  levofloxacin (LEVAQUIN) IVPB 750 mg     750 mg 100 mL/hr over 90 Minutes Intravenous  Once 08/16/16 1129 08/16/16 1534   08/16/16 1100  metroNIDAZOLE (FLAGYL) IVPB 500 mg  Status:  Discontinued     500 mg 100 mL/hr over 60 Minutes Intravenous Every 8 hours 08/16/16 1058 08/20/16 0735   08/16/16 0915  piperacillin-tazobactam (ZOSYN) IVPB 3.375 g     3.375 g 100 mL/hr over 30 Minutes Intravenous  Once 08/16/16 0903 08/16/16 0943      Assessment/Plan:Diverticular bleed as well as diverticulitis. No evidence of further diverticular bleed. Hemodynamics are adequate. As far as diverticulitis she is improving and I do not have a good source of her fever. I will continue broad-spectrum antibiotic therapy with Zosyn. May continue diet. No surgical intervention. I do think that she will benefit from optimization from her cardiac and pulmonary perspective. She may need elective resection of her colon and but for now her medical issues need to be optimized. Discussed with the patient in detail as well as w Dr.  Rickey Primus, MD, [facility]  08/21/2016

## 2016-08-21 NOTE — Care Management Important Message (Signed)
Important Message  Patient Details  Name: [name] MRN: [id number] Date of Birth: 12-16-1945   Medicare Important Message Given:  Yes    Beverly Sessions, RN 08/21/2016, 3:16 PM

## 2016-08-21 NOTE — Clinical Social Work Note (Signed)
Clinical Social Work Assessment  Patient Details  Name: [name] MRN: [id number] Date of Birth: November 17, 1945  Date of referral:  08/21/16               Reason for consult:  Facility Placement                Permission sought to share information with:  Family Supports Permission granted to share information::  Yes, Verbal Permission Granted  Name::        Agency::     Relationship::     Contact Information:     Housing/Transportation Living arrangements for the past 2 months:  Single Family Home Source of Information:  Patient, Spouse Patient Interpreter Needed:  None Criminal Activity/Legal Involvement Pertinent to Current Situation/Hospitalization:  No - Comment as needed Significant Relationships:  Spouse Lives with:  Spouse Do you feel safe going back to the place where you live?  Yes Need for family participation in patient care:  Yes (Comment)  Care giving concerns:  Patient resides with her spouse at home.   Social Worker assessment / plan:  CSW informed that PT recommended STR for patient. CSW spoke with patient and her husband this afternoon and explained role and purpose of visit. Patient was sleeping on and off throughout conversation but her husband was able to answer questions. Patient's husband stated that they would entertain someone coming out to the home to do PT but stated that patient would not want to go to a facility. He stated that there were recommendations made for her to go to an outpatient heart clinic and she didn't want to follow up with that either. He stated that they would be open to home health. CSW informed RN CM of this.  Employment status:  Retired Nurse, adult PT Recommendations:  [place] / Referral to community resources:     Patient/Family's Response to care:  Patient and husband declining STR at this time.   Patient/Family's Understanding of and Emotional Response to Diagnosis,  Current Treatment, and Prognosis:  Patient's husband is open to having home health follow patient.  Emotional Assessment Appearance:  Appears stated age Attitude/Demeanor/Rapport:   (pleasant/lethargic) Affect (typically observed):  Calm, Quiet Orientation:  Oriented to Self, Oriented to Place, Oriented to Situation Alcohol / Substance use:  Not Applicable Psych involvement (Current and /or in the community):  No (Comment)  Discharge Needs  Concerns to be addressed:  Care Coordination Readmission within the last 30 days:  No Current discharge risk:  None Barriers to Discharge:  No Barriers Identified   Shela Leff, LCSW 08/21/2016, 3:56 PM

## 2016-08-21 NOTE — Evaluation (Signed)
Physical Therapy Evaluation Patient Details Name: [name] MRN: [id number] DOB: [birth year] Today's Date: 08/21/2016   History of Present Illness  Pt is a 71 y.o. female presenting to hospital 08/16/16 with 9/10 LLQ abdominal pain with BRBPR.  Pt admitted with diverticulitis without perforation.  Pt s/p 08/16/16 coil embolization L celic branch and 2 sigmoidal branches of IMA.  Pt also s/p multiple PRBC transfusions.  PMH includes chronic depression, chronic HA's, COPD, CABG x2, DM, HOH, OSA, and s/p partial colectomy.  Clinical Impression  Prior to hospital admission, pt was independent.  Pt lives with her husband in 1 level home with 5 steps to enter with L railing.  Currently pt is SBA supine to sit, CGA to min assist with transfers, and CGA (with chair follow) ambulating 10-15 feet with RW (limited distance d/t impaired activity tolerance and c/o dizziness although BP WFL during session).  Pt's O2 95% or greater during session on 2 L O2 via nasal cannula.  Pt would benefit from skilled PT to address noted impairments and functional limitations.  Recommend pt discharge to STR when medically appropriate.     Follow Up Recommendations SNF    Equipment Recommendations  Rolling walker with 5" wheels    Recommendations for Other Services       Precautions / Restrictions Precautions Precautions: Fall Restrictions Weight Bearing Restrictions: No      Mobility  Bed Mobility Overal bed mobility: Needs Assistance Bed Mobility: Supine to Sit     Supine to sit: Supervision;HOB elevated     General bed mobility comments: mild increased effort noted  Transfers Overall transfer level: Needs assistance Equipment used: Rolling walker (2 wheeled) Transfers: Sit to/from [facility] Sit to Stand: Min assist;Min guard Stand pivot transfers: Min guard (bed to recliner)       General transfer comment: min assist to initiate stand from bed; CGA to stand from recliner x2  trials; vc's to scoot forward prior to standing from chair  Ambulation/Gait Ambulation/Gait assistance: Min guard;+2 safety/equipment (2nd assist for chair follow) Ambulation Distance (Feet):  (10 feet 1st trial; 15 feet 2nd trial) Assistive device: Rolling walker (2 wheeled) Gait Pattern/deviations: Decreased step length - right;Decreased step length - left Gait velocity: decreased   General Gait Details: decreased B foot clearance; limited ambulation distance d/t "dizziness" (BP 120/53 and 119/57); vc's for breathing technique  Stairs            Wheelchair Mobility    Modified Rankin (Stroke Patients Only)       Balance Overall balance assessment: Needs assistance Sitting-balance support: Bilateral upper extremity supported;Feet supported Sitting balance-Leahy Scale: Good     Standing balance support: Bilateral upper extremity supported (use of RW) Standing balance-Leahy Scale: Fair Standing balance comment: static standing                             Pertinent Vitals/Pain Pain Assessment: 0-10 Pain Score: 2  Pain Location: LLQ abdomen Pain Descriptors / Indicators: Discomfort Pain Intervention(s): Limited activity within patient's tolerance;Monitored during session;Repositioned  HR 62-72 bpm during session.    Home Living Family/patient expects to be discharged to:: Private residence Living Arrangements: Spouse/significant other Available Help at Discharge: Family Type of Home: House Home Access: Stairs to enter Entrance Stairs-Rails: Left Entrance Stairs-Number of Steps: 5 Home Layout: One level Home Equipment:  (Has cane and walker from husband)      Prior Function Level of Independence: Independent  Comments: Pt denies any h/o falls.     Hand Dominance        Extremity/Trunk Assessment   Upper Extremity Assessment Upper Extremity Assessment: Generalized weakness    Lower Extremity Assessment Lower Extremity  Assessment: Generalized weakness       Communication   Communication: HOH  Cognition Arousal/Alertness: Awake/alert Behavior During Therapy: Anxious Overall Cognitive Status: Within Functional Limits for tasks assessed                      General Comments General comments (skin integrity, edema, etc.): Pt's husband arrived during session.  Nursing cleared pt for participation in physical therapy.  Pt agreeable to PT session but reporting being incontinent of urine and needing clean-up (pt SBA logrolling L and R in bed but requiring assist for hygiene/clean-up and donning new briefs).    Exercises  Transfer and ambulation activities.   Assessment/Plan    PT Assessment Patient needs continued PT services  PT Problem List Decreased strength;Decreased activity tolerance;Decreased balance;Decreased mobility;Decreased knowledge of use of DME       PT Treatment Interventions DME instruction;Gait training;Stair training;Functional mobility training;Therapeutic activities;Therapeutic exercise;Balance training;Patient/family education    PT Goals (Current goals can be found in the Care Plan section)  Acute Rehab PT Goals Patient Stated Goal: to be able to take a shower PT Goal Formulation: With patient Time For Goal Achievement: 09/04/16 Potential to Achieve Goals: Good    Frequency Min 2X/week   Barriers to discharge Inaccessible home environment      Co-evaluation               End of Session Equipment Utilized During Treatment: Gait belt;Oxygen (2 L O2 via nasal cannula) Activity Tolerance: Patient limited by fatigue Patient left: in chair;with call bell/phone within reach;with chair alarm set;with family/visitor present Nurse Communication: Mobility status;Precautions (via white board) PT Visit Diagnosis: Muscle weakness (generalized) (M62.81);Difficulty in walking, not elsewhere classified (R26.2)         Time: 3149-7026 PT Time Calculation (min) (ACUTE  ONLY): 40 min   Charges:   PT Evaluation $PT Eval Low Complexity: 1 Procedure PT Treatments $Therapeutic Activity: 23-37 mins   PT G CodesLeitha Bleak, PT 08/21/16, 11:28 AM [phone]

## 2016-08-22 LAB — GLUCOSE, CAPILLARY
GLUCOSE-CAPILLARY: 105 mg/dL — AB (ref 65–99)
GLUCOSE-CAPILLARY: 178 mg/dL — AB (ref 65–99)
Glucose-Capillary: 135 mg/dL — ABNORMAL HIGH (ref 65–99)
Glucose-Capillary: 174 mg/dL — ABNORMAL HIGH (ref 65–99)

## 2016-08-22 LAB — CBC
HEMATOCRIT: 28.6 % — AB (ref 35.0–47.0)
HEMOGLOBIN: 9.6 g/dL — AB (ref 12.0–16.0)
MCH: 28.3 pg (ref 26.0–34.0)
MCHC: 33.7 g/dL (ref 32.0–36.0)
MCV: 83.9 fL (ref 80.0–100.0)
Platelets: 166 10*3/uL (ref 150–440)
RBC: 3.41 MIL/uL — ABNORMAL LOW (ref 3.80–5.20)
RDW: 15.6 % — ABNORMAL HIGH (ref 11.5–14.5)
WBC: 9.9 10*3/uL (ref 3.6–11.0)

## 2016-08-22 LAB — BASIC METABOLIC PANEL
Anion gap: 7 (ref 5–15)
BUN: 6 mg/dL (ref 6–20)
CHLORIDE: 100 mmol/L — AB (ref 101–111)
CO2: 29 mmol/L (ref 22–32)
CREATININE: 0.52 mg/dL (ref 0.44–1.00)
Calcium: 7.6 mg/dL — ABNORMAL LOW (ref 8.9–10.3)
GFR calc non Af Amer: >60 mL/min (ref >60)
Glucose, Bld: 196 mg/dL — ABNORMAL HIGH (ref 65–99)
POTASSIUM: 3 mmol/L — AB (ref 3.5–5.1)
SODIUM: 136 mmol/L (ref 135–145)

## 2016-08-22 MED ORDER — FUROSEMIDE 20 MG PO TABS
20.0000 mg | ORAL_TABLET | Freq: Every day | ORAL | Status: DC
  Administered 2016-08-22 – 2016-08-23 (×2): 20 mg via ORAL
  Filled 2016-08-22 (×2): qty 1

## 2016-08-22 MED ORDER — POTASSIUM CHLORIDE CRYS ER 20 MEQ PO TBCR
40.0000 meq | EXTENDED_RELEASE_TABLET | Freq: Two times a day (BID) | ORAL | Status: DC
  Administered 2016-08-22 – 2016-08-24 (×4): 40 meq via ORAL
  Filled 2016-08-22 (×5): qty 2

## 2016-08-22 MED ORDER — CHOLESTYRAMINE LIGHT 4 G PO PACK
4.0000 g | PACK | Freq: Two times a day (BID) | ORAL | Status: DC
  Administered 2016-08-22 – 2016-08-23 (×2): 4 g via ORAL
  Filled 2016-08-22 (×5): qty 1

## 2016-08-22 MED ORDER — HYDROMORPHONE HCL 2 MG PO TABS
1.0000 mg | ORAL_TABLET | Freq: Four times a day (QID) | ORAL | Status: DC | PRN
  Administered 2016-08-22 – 2016-08-23 (×2): 1 mg via ORAL
  Filled 2016-08-22 (×2): qty 1

## 2016-08-22 MED ORDER — POTASSIUM CHLORIDE CRYS ER 20 MEQ PO TBCR
40.0000 meq | EXTENDED_RELEASE_TABLET | Freq: Every day | ORAL | Status: DC
  Administered 2016-08-22: 40 meq via ORAL
  Filled 2016-08-22: qty 2

## 2016-08-22 NOTE — Progress Notes (Signed)
[facility] [place] [facility] NAME: [name]    MR#:  [id number]  DATE OF BIRTH:  06/05/1945  SUBJECTIVE:  CHIEF COMPLAINT:  Patient Is better overall, less abdominal pain, continues to have diarrheal stool, brown in color, no black stool, no bleeding. White cell count has improved. Admits of having left-sided abdominal pain, requiring some pain medications, Dilaudid intravenously. No change to oral. CT scan of abdomen and pelvis, repeated, revealed improvement of diverticulitis inflammation. No abscess was noted. Patient continues to have temperature elevation intermittently to 100.4. She feels satisfactory, requests more food. Physical therapy saw patient in consultation and recommended skilled nursing facility placement for rehabilitation, Which patient refused.  patient feels well today, denies any significant abdominal pain, denies shortness of breath. Continues to have diarrhea. Requests more food.   REVIEW OF SYSTEMS:  CONSTITUTIONAL: Admits of fever, fatigue or weakness.  EYES: No blurred or double vision.  EARS, NOSE, AND THROAT: No tinnitus or ear pain.  RESPIRATORY: No cough, shortness of breath, wheezing or hemoptysis.  CARDIOVASCULAR: No chest pain, orthopnea, edema.  GASTROINTESTINAL: No nausea, vomiting, And loose stools Reporting left-sided abdominal pain , not related to diet, defecation.  GENITOURINARY: Some dysuria, no hematuria.  ENDOCRINE: No polyuria, nocturia,  HEMATOLOGY: No anemia, easy bruising or bleeding SKIN: No rash or lesion. MUSCULOSKELETAL: No joint pain or arthritis.   NEUROLOGIC: No tingling, numbness, weakness.  PSYCHIATRY: No anxiety or depression.   DRUG ALLERGIES:   Allergies  Allergen Reactions  . Amitriptyline Other (See Comments)    Unknown reaction  . Benadryl [Diphenhydramine] Shortness Of Breath  . Demerol [Meperidine] Other (See Comments)    Unknown reaction  . Gabapentin Other (See Comments)     Unknown reaction  . Loratadine Other (See Comments)    Unknown reaction  . Meperidine Hcl Other (See Comments)    Unknown reaction  . Mirtazapine Other (See Comments)    Unknown reaction  . Olanzapine Other (See Comments)    Unknown reaction   . Voltaren [Diclofenac Sodium] Shortness Of Breath  . Zetia [Ezetimibe] Other (See Comments)    Weakness in legs, shakiness all over  . Ativan [Lorazepam] Other (See Comments)    Causes double vision at highter than .5 mg dose  . Atorvastatin Other (See Comments)    Muscle aches and weakness  . Budesonide-Formoterol Fumarate Other (See Comments)    Shakiness, tremors  . Bupropion Hcl Other (See Comments)    "cloud over me" depression  . Caffeine Other (See Comments)    jitters  . Codeine Sulfate Other (See Comments)    Makes chest hurt like a heart attack  . Lisinopril Cough  . Metformin Nausea And Vomiting  . Mometasone Furoate Nausea And Vomiting  . Morphine Sulfate Other (See Comments)    Chest pain like a heart attack  . Other Other (See Comments)    Beta Blockers, reaction shortness of breath  . Oxycodone-Acetaminophen Nausea And Vomiting  . Pioglitazone Other (See Comments)    Cannot take because of risk of bladder cancer  . Propoxyphene N-Acetaminophen Nausea And Vomiting  . Rosuvastatin Other (See Comments)    Muscle aches and weakness  . Shellfish Allergy Diarrhea  . Tramadol Nausea Only  . Venlafaxine Other (See Comments)    Unknown reaction  . Zolpidem Tartrate Other (See Comments)     Jittery, diarrhea  . Latex Rash    VITALS:  Blood pressure (!) 120/52, pulse 65, temperature 98.1  F (36.7 C), temperature source Oral, resp. rate 19, height 5\' 6"  (1.676 m), weight 91.7 kg (202 lb 3.2 oz), SpO2 94 %.  PHYSICAL EXAMINATION:  GENERAL:  71 y.o.-year-old patient lying in the bed with no acute distress.  EYES: Pupils equal, round, reactive to light and accommodation. No scleral icterus. Extraocular muscles intact.   HEENT: Head atraumatic, normocephalic. Oropharynx and nasopharynx clear.  NECK:  Supple, no jugular venous distention. No thyroid enlargement, no tenderness.  LUNGS: Normal breath sounds bilaterally, no wheezing,Few scattered rales,rhonchi and crepitations  posteriorly only at bases. No use of accessory muscles of respiration.  CARDIOVASCULAR: S1, S2 normal. 3/6 systolic murmur, no  rubs, or gallops.  ABDOMEN: Soft, not painful, no distention,   Bowel sounds are active. No organomegaly or mass.  EXTREMITIES: No pedal edema, cyanosis, or clubbing.  NEUROLOGIC: Cranial nerves II through XII are intact. Muscle strength 5/5 in all extremities. Sensation intact. Gait not checked.  PSYCHIATRIC: The patient is alert and oriented x 3.  SKIN: No obvious rash, lesion, or ulcer.    LABORATORY PANEL:   CBC  Recent Labs Lab 08/22/16 1022  WBC 9.9  HGB 9.6*  HCT 28.6*  PLT 166   ------------------------------------------------------------------------------------------------------------------  Chemistries   Recent Labs Lab 08/17/16 0722  08/20/16 0334  NA 132*  < > 137  K 4.8  < > 3.5  CL 103  < > 105  CO2 23  < > 29  GLUCOSE 364*  < > 121*  BUN 24*  < > 11  CREATININE 1.06*  < > 0.65  CALCIUM 6.8*  < > 7.2*  AST 25  --   --   ALT 14  --   --   ALKPHOS 64  --   --   BILITOT 0.6  --   --   < > = values in this interval not displayed. ------------------------------------------------------------------------------------------------------------------  Cardiac Enzymes No results for input(s): TROPONINI in the last 168 hours. ------------------------------------------------------------------------------------------------------------------  RADIOLOGY:  Ct Abdomen Pelvis W Contrast  Result Date: 08/21/2016 CLINICAL DATA:  Follow-up diverticulitis, persistent diarrhea and fever however improvement in abdominal pain EXAM: CT ABDOMEN AND PELVIS WITH CONTRAST TECHNIQUE: Multidetector CT  imaging of the abdomen and pelvis was performed using the standard protocol following bolus administration of intravenous contrast. CONTRAST:  130mL ISOVUE-300 IOPAMIDOL (ISOVUE-300) INJECTION 61% COMPARISON:  08/16/2016 FINDINGS: Lower chest: Lung bases shows small bilateral pleural effusion. Bilateral basilar posterior atelectasis. Small hiatal hernia is noted. Cardiomegaly. Again noted mitral valve calcifications. Hepatobiliary: There is mild fatty infiltration of the liver. No focal hepatic mass. Status post cholecystectomy. Pancreas: Enhanced pancreas is mild atrophic without focal abnormality. Spleen: Enhanced spleen without focal abnormality. Atherosclerotic calcifications of splenic artery. Adrenals/Urinary Tract: No adrenal gland mass. Enhanced kidneys are symmetrical in size. No hydronephrosis or hydroureter. No nephrolithiasis. Delayed renal images shows bilateral renal symmetrical excretion. Bilateral visualized proximal ureter is unremarkable. Stomach/Bowel: There is no small bowel obstruction. No gastric outlet obstruction. Contrast material noted throughout the colon. No pericecal inflammation.  The terminal ileum is unremarkable. Axial image 59 at the site of previous for mild diverticulitis at the junction of descending colon with sigmoid colon there is improvement with less thickening of diverticular wall. Only minimal stranding of surrounding pericolonic fat without evidence of perforation or pericolonic abscess. Axial image 48 and coronal image 46 there is nonspecific mild segmental thickening of colonic wall in the mid descending colon. Nonspecific mild segmental colitis cannot be excluded. There is no evidence of pericolonic  abscess or extraluminal contrast material. Stable postsurgical changes in distal sigmoid colon without evidence of anastomotic stricture. Again noted distal sigmoid colon diverticuli without definite evidence of new acute diverticulitis. Vascular/Lymphatic: Atherosclerotic  calcifications of abdominal aorta and iliac arteries. No aortic aneurysm. Reproductive: Status post hysterectomy. Other: No ascites or free abdominal air. No inguinal adenopathy. The urinary bladder is unremarkable. Musculoskeletal: No destructive bony lesions are noted. There is disc space flattening with vacuum disc phenomenon mild anterior mild posterior spurring at L4-L5 and L5-S1 level. IMPRESSION: 1. There is improvement at the site of previous focal diverticulitis at the junction of descending colon with sigmoid colon. Only minimal residual pericolonic inflammatory changes without evidence of residual pericolonic abscess or perforation. 2. Axial image 48 and coronal image 46 there is nonspecific mild segmental thickening of colonic wall in the mid descending colon. Nonspecific mild segmental colitis cannot be excluded. 3. Again noted distal sigmoid colon diverticuli without definite evidence of new acute diverticulitis. No distal colonic obstruction. No pericecal inflammation. No small bowel obstruction. 4. No hydronephrosis or hydroureter. 5. Status post hysterectomy. Electronically Signed   By: Lahoma Crocker M.D.   On: 08/21/2016 13:15   Dg Abd Acute W/chest  Result Date: 08/21/2016 CLINICAL DATA:  Abdominal pain. EXAM: DG ABDOMEN ACUTE W/ 1V CHEST COMPARISON:  08/17/2016. FINDINGS: Prior cardiac valve replacement. Prior CABG. Cardiomegaly with mild bilateral interstitial prominence. Mild CHF cannot be excluded. Small left pleural effusion. No pneumothorax . IMPRESSION: 1.  Prior cardiac valve replacement.  Prior CABG. 2. Cardiomegaly with mild bilateral from interstitial prominence of small left pleural effusion consistent with CHF. Electronically Signed   By: Marcello Moores  Register   On: 08/21/2016 08:29    EKG:   Orders placed or performed in visit on 06/28/16  . EKG 12-Lead    ASSESSMENT AND PLAN:   [name]  is a 71 y.o. female with a known history of Coronary artery disease status post CABG,  COPD, diabetes mellitus, hypothyroidism, history of a diverticulitis in the past status post partial colectomy for the same and other medical problems is presenting to the ED with a chief complaint of lower abdominal pain and bleeding per rectum started at around 3 AM today. Denies any fever. CT abdomen in the ED has revealed acute diverticulitis hospitalist team is called to admit the patient. Patient is reporting 7 out of 10 lower abdominal pain  #Acute lower abdominal pain with lower GI bleed secondary to acute diverticulitis. Lactic acid level was normal, unlikely ischemic colitis per surgery, status post embolization of IMA by Dr. Lucky Cowboy. White blood cell count has improved, bleeding has stopped, hemoglobin level remains stable, , fever is subsiding on Zosyn, repeated CT scan of abdomen and pelvis with contrast showed improvement, advancing diet to soft diet. Appreciate gastroenterology and surgery input. Pain management as needed with po Dilaudid , decrease frequency  #Acute posthemorrhagic anemia, GI bleeding scan was positive for active bleeding. Status post embolization of IMA by  vascular surgery Dr. Lucky Cowboy on 08/16/2016 -Gen. surgery  Dr. Hampton Abbot saw patient in  consultation and recommended colectomy if bleeding is uncontrollable following embolization, surgeon is actively following . Patient had history of colectomy in the past for acute diverticulitis -Status post 4 units of blood transfusion. Continue to monitor hemoglobin . Vascular and gastroenterology signed off at this time. Holding aspirin and Effient   abdominal x-ray no abnormality, no perforation  #Insulin dependen diabetes mellitus Initiate diabetic diet, continue sliding scale insulin hemoglobin A1c 6.0, blood glucose  levels ranging between 110-160  #History of coronary artery disease status post CABG Hold aspirin and Effient in view of active GI bleed Patient denies any chest pain at this time  # history of COPD , some  wheezing, off IV fluids nebulizer treatments as needed basis  #Hypothyroidism continue Synthroid TSH 1.5.normal .  # Acute diastolic CHF, resolved on few doses of Lasix, echocardiogram in December 2017, ejection fraction on normal, LVH, diastolic dysfunction,aortic bioprosthesis, elevated pulmonary pressures, follow oxygenation, ins and outs, weight, continue Lasix orally   #Hyponatremia, sodium level has improved with IV fluid administration, now off IV fluids, follow closely with diuresis   #. Leukocytosis, resolved with therapy  # Fever due to intestinal inflammation, likely, improving with Zosyn, influenza test was negative, urinalysis was remarkable for elevated white blood cell count, urine cultures negative,  C. difficile, gastrointestinal panel was unremarkable. Repeated CT scan of abdomen and pelvis revealed improvement. Possible discharge home if afebrile for 24 hours.  # . Generalized weakness, skilled nursing facility placement for rehabilitation was recommended by physical therapist, social worker is involved, however, patient refused, will rediscuss tomorrow, possible discharge home tomorrow with home health services if refuses again.    All the records are reviewed and case discussed with Care Management/Social Workerr. Management plans discussed with the patient, husband and they are in agreement.  CODE STATUS: fc   TOTAL TIME TAKING CARE OF THIS PATIENT: 35  minutes.  Discussed with patient's husband POSSIBLE D/C IN 1-2 DAYS, DEPENDING ON CLINICAL CONDITION.  Note: This dictation was prepared with Dragon dictation along with smaller phrase technology. Any transcriptional errors that result from this process are unintentional.   Amos Gaber M.D on 08/22/2016 at 3:22 PM  Between 7am to 6pm - Pager - [phone] After 6pm go to [email or web address] - password [email or web address] [facility]  Office  [phone]  CC: Primary care physician; Glendon Axe, MD

## 2016-08-22 NOTE — Care Management (Signed)
RNCM was notified that patient was declining SNF. Met with patient to discuss arranging home health.  PCP Candiss Norse.  Pharmacy [facility].  Patient states "I do not want those people coming to my house.  I have had them come out before, and don't want them to come again."  Patient states "If I have to go to rehab I will just go to rehab, but I only want to go for a few days.". I have notified CSW that patient may be agreeable to go to SNF.  RNCM following for discharge planning

## 2016-08-22 NOTE — Care Management (Signed)
RNCM followed up with patient again.  Patient has decided to discharge home with home health services.  Patient was provided home health agency preference.  Patient would like to use [facility], however they are not accepting [facility] patients at this time.  Patient states "If I can't have [facility] I don't have a preference"  Heads up referral was made to [place] with [facility].  Anticipate need for RN, PT, and aide.  Patient requesting [facility], I have also given a heads up to [facility] with [place] for [facility].  RNCM following.

## 2016-08-23 ENCOUNTER — Inpatient Hospital Stay: Payer: Medicare Other

## 2016-08-23 LAB — GLUCOSE, CAPILLARY
GLUCOSE-CAPILLARY: 165 mg/dL — AB (ref 65–99)
GLUCOSE-CAPILLARY: 171 mg/dL — AB (ref 65–99)
Glucose-Capillary: 170 mg/dL — ABNORMAL HIGH (ref 65–99)
Glucose-Capillary: 199 mg/dL — ABNORMAL HIGH (ref 65–99)

## 2016-08-23 MED ORDER — CIPROFLOXACIN HCL 500 MG PO TABS
500.0000 mg | ORAL_TABLET | Freq: Two times a day (BID) | ORAL | Status: DC
  Administered 2016-08-23 – 2016-08-24 (×3): 500 mg via ORAL
  Filled 2016-08-23 (×3): qty 1

## 2016-08-23 MED ORDER — POTASSIUM CHLORIDE CRYS ER 20 MEQ PO TBCR
40.0000 meq | EXTENDED_RELEASE_TABLET | Freq: Once | ORAL | Status: AC
  Administered 2016-08-23: 40 meq via ORAL

## 2016-08-23 MED ORDER — LOPERAMIDE HCL 2 MG PO CAPS
2.0000 mg | ORAL_CAPSULE | Freq: Four times a day (QID) | ORAL | Status: DC | PRN
  Administered 2016-08-23 (×2): 2 mg via ORAL
  Filled 2016-08-23 (×3): qty 1

## 2016-08-23 MED ORDER — FUROSEMIDE 10 MG/ML IJ SOLN
40.0000 mg | Freq: Two times a day (BID) | INTRAMUSCULAR | Status: DC
  Filled 2016-08-23 (×2): qty 4

## 2016-08-23 MED ORDER — METRONIDAZOLE 500 MG PO TABS
500.0000 mg | ORAL_TABLET | Freq: Three times a day (TID) | ORAL | Status: DC
  Administered 2016-08-23 – 2016-08-24 (×4): 500 mg via ORAL
  Filled 2016-08-23 (×4): qty 1

## 2016-08-23 MED ORDER — FUROSEMIDE 40 MG PO TABS
40.0000 mg | ORAL_TABLET | Freq: Once | ORAL | Status: AC
  Administered 2016-08-23: 40 mg via ORAL
  Filled 2016-08-23: qty 1

## 2016-08-23 NOTE — Progress Notes (Signed)
Physical Therapy Treatment Patient Details Name: [name] MRN: [id number] DOB: 10/23/1945 Today's Date: 08/23/2016    History of Present Illness Pt is a 71 y.o. female presenting to hospital 08/16/16 with 9/10 LLQ abdominal pain with BRBPR.  Pt admitted with diverticulitis without perforation.  Pt s/p 08/16/16 coil embolization L celic branch and 2 sigmoidal branches of IMA.  Pt also s/p multiple PRBC transfusions.  PMH includes chronic depression, chronic HA's, COPD, CABG x2, DM, HOH, OSA, and s/p partial colectomy.    PT Comments    Initially refused this am due to fatigue and poor sleep but agreed on second attempt.  Bed mobility with rails and HOB raised but without assist.  Stood with min guard and was able to ambulate 30' x 2 with walker and min guard.  Wheelchair follow was used due to general fatigue and limited ambulation distances.  Pt on room air upon arrival.  Sats 92% at rest,  During gait, sats decreased to 89% on room air but returned to 92% with rest.  Further ambulation not tested due to fatigue.  She returned to bed after session.    Pt would benefit from SNF upon discharge but pt refusing.  Discussed discharge plan and ways to increase safety if she continued to decline SNF.  Husband encouraged to ambulate with her at all times with walker.  HHPT would be appropriate to increase strength and mobility.   Follow Up Recommendations  SNF     Equipment Recommendations  Rolling walker with 5" wheels    Recommendations for Other Services       Precautions / Restrictions Precautions Precautions: Fall    Mobility  Bed Mobility Overal bed mobility: Needs Assistance Bed Mobility: Supine to Sit;Sit to Supine     Supine to sit: Supervision;HOB elevated Sit to supine: Supervision;HOB elevated   General bed mobility comments: mild increased effort noted  Transfers Overall transfer level: Needs assistance Equipment used: Rolling walker (2 wheeled) Transfers: Sit  to/from Stand Sit to Stand: Min guard            Ambulation/Gait Ambulation/Gait assistance: Min guard Ambulation Distance (Feet): 30 Feet Assistive device: Rolling walker (2 wheeled) Gait Pattern/deviations: Step-through pattern Gait velocity: decreased Gait velocity interpretation: Below normal speed for age/gender General Gait Details: no dizziness reported today, 30' x 2 limited by fatigue   Stairs            Wheelchair Mobility    Modified Rankin (Stroke Patients Only)       Balance Overall balance assessment: Needs assistance Sitting-balance support: Bilateral upper extremity supported;Feet supported Sitting balance-Leahy Scale: Good     Standing balance support: Bilateral upper extremity supported Standing balance-Leahy Scale: Fair                      Cognition Arousal/Alertness: Awake/alert Behavior During Therapy: WFL for tasks assessed/performed Overall Cognitive Status: Within Functional Limits for tasks assessed                      Exercises      General Comments        Pertinent Vitals/Pain Pain Assessment: No/denies pain    Home Living                      Prior Function            PT Goals (current goals can now be found in the care plan section) Progress  towards PT goals: Progressing toward goals    Frequency    Min 2X/week      PT Plan Current plan remains appropriate    Co-evaluation             End of Session Equipment Utilized During Treatment: Gait belt Activity Tolerance: Patient limited by fatigue Patient left: with call bell/phone within reach;with family/visitor present;in bed;with bed alarm set         Time: 1114-1130 PT Time Calculation (min) (ACUTE ONLY): 16 min  Charges:  $Gait Training: 8-22 mins                    G Codes:       Chesley Noon, PTA 08/23/16, 12:02 PM

## 2016-08-23 NOTE — Progress Notes (Signed)
MD notified of burning when flushing IV, supervisor attempted IV stick but was not successful. MD ordered to change IV Lasix to PO Lasix. IV was removed.

## 2016-08-23 NOTE — Progress Notes (Signed)
CC: Diverticulitis and diverticular bleed Subjective: IMproving abd pain Diarrhea Tolerating po  Objective: Vital signs in last 24 hours: Temp:  [98.1 F (36.7 C)-99.5 F (37.5 C)] 98.2 F (36.8 C) (03/22 0823) Pulse Rate:  [62-73] 65 (03/22 0823) Resp:  [19-20] 20 (03/22 0522) BP: (108-137)/(47-60) 108/52 (03/22 0823) SpO2:  [94 %-97 %] 96 % (03/22 0819) Last BM Date: 08/22/16  Intake/Output from previous day: 03/21 0701 - 03/22 0700 In: 1140 [P.O.:1090; IV Piggyback:50] Out: 200 [Urine:200] Intake/Output this shift: No intake/output data recorded.  Physical exam: Obese female NAD Abd: soft, NT, NT, no peritonitis Ext: well perfused and warm  Lab Results: CBC   Recent Labs  08/22/16 1022  WBC 9.9  HGB 9.6*  HCT 28.6*  PLT 166   BMET  Recent Labs  08/22/16 1551  NA 136  K 3.0*  CL 100*  CO2 29  GLUCOSE 196*  BUN 6  CREATININE 0.52  CALCIUM 7.6*   PT/INR No results for input(s): LABPROT, INR in the last 72 hours. ABG No results for input(s): PHART, HCO3 in the last 72 hours.  Invalid input(s): PCO2, PO2  Studies/Results: Ct Abdomen Pelvis W Contrast  Result Date: 08/21/2016 CLINICAL DATA:  Follow-up diverticulitis, persistent diarrhea and fever however improvement in abdominal pain EXAM: CT ABDOMEN AND PELVIS WITH CONTRAST TECHNIQUE: Multidetector CT imaging of the abdomen and pelvis was performed using the standard protocol following bolus administration of intravenous contrast. CONTRAST:  164mL ISOVUE-300 IOPAMIDOL (ISOVUE-300) INJECTION 61% COMPARISON:  08/16/2016 FINDINGS: Lower chest: Lung bases shows small bilateral pleural effusion. Bilateral basilar posterior atelectasis. Small hiatal hernia is noted. Cardiomegaly. Again noted mitral valve calcifications. Hepatobiliary: There is mild fatty infiltration of the liver. No focal hepatic mass. Status post cholecystectomy. Pancreas: Enhanced pancreas is mild atrophic without focal abnormality. Spleen:  Enhanced spleen without focal abnormality. Atherosclerotic calcifications of splenic artery. Adrenals/Urinary Tract: No adrenal gland mass. Enhanced kidneys are symmetrical in size. No hydronephrosis or hydroureter. No nephrolithiasis. Delayed renal images shows bilateral renal symmetrical excretion. Bilateral visualized proximal ureter is unremarkable. Stomach/Bowel: There is no small bowel obstruction. No gastric outlet obstruction. Contrast material noted throughout the colon. No pericecal inflammation.  The terminal ileum is unremarkable. Axial image 59 at the site of previous for mild diverticulitis at the junction of descending colon with sigmoid colon there is improvement with less thickening of diverticular wall. Only minimal stranding of surrounding pericolonic fat without evidence of perforation or pericolonic abscess. Axial image 48 and coronal image 46 there is nonspecific mild segmental thickening of colonic wall in the mid descending colon. Nonspecific mild segmental colitis cannot be excluded. There is no evidence of pericolonic abscess or extraluminal contrast material. Stable postsurgical changes in distal sigmoid colon without evidence of anastomotic stricture. Again noted distal sigmoid colon diverticuli without definite evidence of new acute diverticulitis. Vascular/Lymphatic: Atherosclerotic calcifications of abdominal aorta and iliac arteries. No aortic aneurysm. Reproductive: Status post hysterectomy. Other: No ascites or free abdominal air. No inguinal adenopathy. The urinary bladder is unremarkable. Musculoskeletal: No destructive bony lesions are noted. There is disc space flattening with vacuum disc phenomenon mild anterior mild posterior spurring at L4-L5 and L5-S1 level. IMPRESSION: 1. There is improvement at the site of previous focal diverticulitis at the junction of descending colon with sigmoid colon. Only minimal residual pericolonic inflammatory changes without evidence of residual  pericolonic abscess or perforation. 2. Axial image 48 and coronal image 46 there is nonspecific mild segmental thickening of colonic wall in the mid descending  colon. Nonspecific mild segmental colitis cannot be excluded. 3. Again noted distal sigmoid colon diverticuli without definite evidence of new acute diverticulitis. No distal colonic obstruction. No pericecal inflammation. No small bowel obstruction. 4. No hydronephrosis or hydroureter. 5. Status post hysterectomy. Electronically Signed   By: Lahoma Crocker M.D.   On: 08/21/2016 13:15    Anti-infectives: Anti-infectives    Start     Dose/Rate Route Frequency Ordered Stop   08/20/16 0930  piperacillin-tazobactam (ZOSYN) IVPB 3.375 g     3.375 g 12.5 mL/hr over 240 Minutes Intravenous Every 8 hours 08/20/16 0917     08/17/16 1000  levofloxacin (LEVAQUIN) IVPB 750 mg  Status:  Discontinued     750 mg 100 mL/hr over 90 Minutes Intravenous Every 24 hours 08/16/16 1129 08/20/16 0735   08/16/16 1745  ceFAZolin (ANCEF) IVPB 1 g/50 mL premix     1 g 100 mL/hr over 30 Minutes Intravenous  Once 08/16/16 1740 08/16/16 1810   08/16/16 1130  levofloxacin (LEVAQUIN) IVPB 750 mg     750 mg 100 mL/hr over 90 Minutes Intravenous  Once 08/16/16 1129 08/16/16 1534   08/16/16 1100  metroNIDAZOLE (FLAGYL) IVPB 500 mg  Status:  Discontinued     500 mg 100 mL/hr over 60 Minutes Intravenous Every 8 hours 08/16/16 1058 08/20/16 0735   08/16/16 0915  piperacillin-tazobactam (ZOSYN) IVPB 3.375 g     3.375 g 100 mL/hr over 30 Minutes Intravenous  Once 08/16/16 1281 08/16/16 1886      Assessment/Plan: Diverticulitis and diverticular bleed improving No emergent surgical intervention May f/u [place], MD, [facility]  08/23/2016

## 2016-08-23 NOTE — Progress Notes (Signed)
[facility] [place] NAME: Kambrey Hagger    MR#:  [id number]  DATE OF BIRTH:  1946-02-09  SUBJECTIVE:  CHIEF COMPLAINT:  Patient Is better overall, less abdominal pain, continues to have diarrheal stool, brown in color, no black stool, no bleeding. White cell count has improved. Admits of having left-sided abdominal pain, requiring some pain medications, Dilaudid intravenously. No change to oral. CT scan of abdomen and pelvis, repeated, revealed improvement of diverticulitis inflammation. No abscess was noted. Patient's fever subsided, patient continues to have somewhat soft frequent stools. Denies any significant abdominal pain or shortness of breath. Patient was evaluated on room air on exertion, O2 sats dropped down to 88%, patient was not on oxygen at home. Chest x-ray revealed small bilateral pleural effusions. Lasix was advanced. She does not want to go to skilled nursing facility, she prefers to go back to home with home health services. Likely discharge home tomorrow if we are able to wean her off oxygen. Discussed with patient in regards to oxygen delivery and costs  REVIEW OF SYSTEMS:  CONSTITUTIONAL: Admits of fever, fatigue or weakness.  EYES: No blurred or double vision.  EARS, NOSE, AND THROAT: No tinnitus or ear pain.  RESPIRATORY: No cough, shortness of breath, wheezing or hemoptysis.  CARDIOVASCULAR: No chest pain, orthopnea, edema.  GASTROINTESTINAL: No nausea, vomiting, And loose stools Reporting left-sided abdominal pain , not related to diet, defecation.  GENITOURINARY: Some dysuria, no hematuria.  ENDOCRINE: No polyuria, nocturia,  HEMATOLOGY: No anemia, easy bruising or bleeding SKIN: No rash or lesion. MUSCULOSKELETAL: No joint pain or arthritis.   NEUROLOGIC: No tingling, numbness, weakness.  PSYCHIATRY: No anxiety or depression.   DRUG ALLERGIES:   Allergies  Allergen Reactions  . Amitriptyline Other (See Comments)     Unknown reaction  . Benadryl [Diphenhydramine] Shortness Of Breath  . Demerol [Meperidine] Other (See Comments)    Unknown reaction  . Gabapentin Other (See Comments)    Unknown reaction  . Loratadine Other (See Comments)    Unknown reaction  . Meperidine Hcl Other (See Comments)    Unknown reaction  . Mirtazapine Other (See Comments)    Unknown reaction  . Olanzapine Other (See Comments)    Unknown reaction   . Voltaren [Diclofenac Sodium] Shortness Of Breath  . Zetia [Ezetimibe] Other (See Comments)    Weakness in legs, shakiness all over  . Ativan [Lorazepam] Other (See Comments)    Causes double vision at highter than .5 mg dose  . Atorvastatin Other (See Comments)    Muscle aches and weakness  . Budesonide-Formoterol Fumarate Other (See Comments)    Shakiness, tremors  . Bupropion Hcl Other (See Comments)    "cloud over me" depression  . Caffeine Other (See Comments)    jitters  . Codeine Sulfate Other (See Comments)    Makes chest hurt like a heart attack  . Lisinopril Cough  . Metformin Nausea And Vomiting  . Mometasone Furoate Nausea And Vomiting  . Morphine Sulfate Other (See Comments)    Chest pain like a heart attack  . Other Other (See Comments)    Beta Blockers, reaction shortness of breath  . Oxycodone-Acetaminophen Nausea And Vomiting  . Pioglitazone Other (See Comments)    Cannot take because of risk of bladder cancer  . Propoxyphene N-Acetaminophen Nausea And Vomiting  . Rosuvastatin Other (See Comments)    Muscle aches and weakness  . Shellfish Allergy Diarrhea  . Tramadol Nausea Only  .  Venlafaxine Other (See Comments)    Unknown reaction  . Zolpidem Tartrate Other (See Comments)     Jittery, diarrhea  . Latex Rash    VITALS:  Blood pressure (!) 140/55, pulse 63, temperature 98.4 F (36.9 C), temperature source Oral, resp. rate 20, height 5\' 6"  (1.676 m), weight 91.7 kg (202 lb 3.2 oz), SpO2 95 %.  PHYSICAL EXAMINATION:  GENERAL:  71  y.o.-year-old patient lying in the bed with no acute distress.  EYES: Pupils equal, round, reactive to light and accommodation. No scleral icterus. Extraocular muscles intact.  HEENT: Head atraumatic, normocephalic. Oropharynx and nasopharynx clear.  NECK:  Supple, no jugular venous distention. No thyroid enlargement, no tenderness.  LUNGS: Some diminished breath sounds bilaterally at bases, no wheezing,Few scattered rales,rhonchi and crepitations  posteriorly at bases. No use of accessory muscles of respiration.  CARDIOVASCULAR: S1, S2 normal. 3/6 systolic murmur, no  rubs, or gallops.  ABDOMEN: Soft, not painful, no distention,   Bowel sounds are active. No organomegaly or mass.  EXTREMITIES: No pedal edema, cyanosis, or clubbing.  NEUROLOGIC: Cranial nerves II through XII are intact. Muscle strength 5/5 in all extremities. Sensation intact. Gait not checked.  PSYCHIATRIC: The patient is alert and oriented x 3.  SKIN: No obvious rash, lesion, or ulcer.    LABORATORY PANEL:   CBC  Recent Labs Lab 08/22/16 1022  WBC 9.9  HGB 9.6*  HCT 28.6*  PLT 166   ------------------------------------------------------------------------------------------------------------------  Chemistries   Recent Labs Lab 08/17/16 0722  08/22/16 1551  NA 132*  < > 136  K 4.8  < > 3.0*  CL 103  < > 100*  CO2 23  < > 29  GLUCOSE 364*  < > 196*  BUN 24*  < > 6  CREATININE 1.06*  < > 0.52  CALCIUM 6.8*  < > 7.6*  AST 25  --   --   ALT 14  --   --   ALKPHOS 64  --   --   BILITOT 0.6  --   --   < > = values in this interval not displayed. ------------------------------------------------------------------------------------------------------------------  Cardiac Enzymes No results for input(s): TROPONINI in the last 168 hours. ------------------------------------------------------------------------------------------------------------------  RADIOLOGY:  Dg Chest 2 View  Result Date:  08/23/2016 CLINICAL DATA:  71 year old female with recent gastrointestinal bleeding status post endovascular intervention. Respiratory failure, hypoxia. EXAM: CHEST  2 VIEW COMPARISON:  08/21/2016 and earlier. FINDINGS: Seated AP and lateral views of the chest. Stable cardiomegaly and mediastinal contours. Previous CABG and cardiac valve replacement. Small layering bilateral pleural effusions appear new. Stable chronic increased interstitial markings with no acute pulmonary edema. No pneumothorax or consolidation. No acute osseous abnormality identified. Negative visible bowel gas pattern. IMPRESSION: Small bilateral pleural effusions. No other acute cardiopulmonary abnormality. Chronic cardiomegaly and pulmonary interstitial changes. Electronically Signed   By: Genevie Ann M.D.   On: 08/23/2016 13:15    EKG:   Orders placed or performed in visit on 06/28/16  . EKG 12-Lead    ASSESSMENT AND PLAN:   Dniyah Grant  is a 71 y.o. female with a known history of Coronary artery disease status post CABG, COPD, diabetes mellitus, hypothyroidism, history of a diverticulitis in the past status post partial colectomy for the same and other medical problems is presenting to the ED with a chief complaint of lower abdominal pain and bleeding per rectum started at around 3 AM today. Denies any fever. CT abdomen in the ED has revealed acute diverticulitis  hospitalist team is called to admit the patient. Patient is reporting 7 out of 10 lower abdominal pain  #Acute diverticulitis with bleed. Lactic acid level was normal, unlikely ischemic colitis per surgery, status post embolization of IMA by Dr. Lucky Cowboy. White blood cell count has improved, bleeding has stopped, hemoglobin level remains stable, , fever subsided on Zosyn, repeated CT scan of abdomen and pelvis with contrast showed improvement, now on soft diet. Appreciate gastroenterology and surgery input. Tramadol as needed  #Acute posthemorrhagic anemia, GI bleeding scan  was positive for active bleeding. Status post embolization of IMA by  vascular surgery Dr. Lucky Cowboy on 08/16/2016 -Gen. surgery  Dr. Hampton Abbot saw patient in  consultation and recommended colectomy if bleeding is uncontrollable following embolization, surgeon , he recommends outpatient follow-up . Patient had history of colectomy in the past for acute diverticulitis -Status post 4 units of blood transfusion. Continue to monitor hemoglobin . Vascular and gastroenterology signed off at this time. Holding aspirin and Effient   abdominal x-ray no abnormality, no perforation  #Insulin dependen diabetes mellitus Initiate diabetic diet, continue sliding scale insulin hemoglobin A1c 6.0, blood glucose levels ranging between 110-160  #History of coronary artery disease status post CABG Hold aspirin and Effient in view of active GI bleed Patient denies any chest pain at this time  # history of COPD , some wheezing, off IV fluids nebulizer treatments as needed basis  #Hypothyroidism continue Synthroid TSH 1.5.normal .  # Acute respiratory failure with hypoxia due to acute diastolic CHF, the patient continues to be hypoxic, echocardiogram in December 2017, ejection fraction on normal, LVH, diastolic dysfunction,aortic bioprosthesis, elevated pulmonary pressures,  continue Lasix orally, follow oxygen levels, may need to be discharged home on oxygen therapy, qualified for oxygen at home.   #Hyponatremia, sodium level has improved with IV fluid administration, now off IV fluids, follow closely with diuresis   #. Leukocytosis, resolved with therapy  # Fever due to intestinal inflammation, improving, follow on the Cipro and Flagyl.  # . Generalized weakness, skilled nursing facility placement for rehabilitation was recommended by physical therapist, social worker is involved, however, patient refused, will likely discharge home tomorrow if no significant improvement in oxygenation with advanced Lasix  doses     CODE STATUS: fc   TOTAL TIME TAKING CARE OF THIS PATIENT: 35  minutes.  Discussed with patient's husband POSSIBLE D/C IN 1-2 DAYS, DEPENDING ON CLINICAL CONDITION.  Note: This dictation was prepared with Dragon dictation along with smaller phrase technology. Any transcriptional errors that result from this process are unintentional.   Kacy Conely M.D on 08/23/2016 at 3:38 PM  Between 7am to 6pm - Pager - [phone] After 6pm go to [email or web address] - password [email or web address] [place] [facility]  Office  [phone]  CC: Primary care physician; Glendon Axe, MD

## 2016-08-24 DIAGNOSIS — D62 Acute posthemorrhagic anemia: Secondary | ICD-10-CM

## 2016-08-24 DIAGNOSIS — R531 Weakness: Secondary | ICD-10-CM

## 2016-08-24 DIAGNOSIS — J9601 Acute respiratory failure with hypoxia: Secondary | ICD-10-CM

## 2016-08-24 DIAGNOSIS — R509 Fever, unspecified: Secondary | ICD-10-CM

## 2016-08-24 DIAGNOSIS — I5031 Acute diastolic (congestive) heart failure: Secondary | ICD-10-CM

## 2016-08-24 DIAGNOSIS — E871 Hypo-osmolality and hyponatremia: Secondary | ICD-10-CM

## 2016-08-24 DIAGNOSIS — R197 Diarrhea, unspecified: Secondary | ICD-10-CM

## 2016-08-24 DIAGNOSIS — D72829 Elevated white blood cell count, unspecified: Secondary | ICD-10-CM

## 2016-08-24 LAB — GLUCOSE, CAPILLARY
GLUCOSE-CAPILLARY: 162 mg/dL — AB (ref 65–99)
Glucose-Capillary: 152 mg/dL — ABNORMAL HIGH (ref 65–99)
Glucose-Capillary: 124 mg/dL — ABNORMAL HIGH (ref 65–99)

## 2016-08-24 LAB — POTASSIUM: Potassium: 4.2 mmol/L (ref 3.5–5.1)

## 2016-08-24 MED ORDER — METRONIDAZOLE 500 MG PO TABS: 500.0000 mg | ORAL_TABLET | Freq: Three times a day (TID) | ORAL | 0 refills | Status: DC

## 2016-08-24 MED ORDER — LOPERAMIDE HCL 2 MG PO CAPS: 2.0000 mg | ORAL_CAPSULE | Freq: Four times a day (QID) | ORAL | 0 refills | Status: DC | PRN

## 2016-08-24 MED ORDER — ONDANSETRON HCL 4 MG PO TABS: 4.0000 mg | ORAL_TABLET | Freq: Four times a day (QID) | ORAL | 0 refills | Status: DC | PRN

## 2016-08-24 MED ORDER — CIPROFLOXACIN HCL 500 MG PO TABS: 500.0000 mg | ORAL_TABLET | Freq: Two times a day (BID) | ORAL | 0 refills | Status: DC

## 2016-08-24 NOTE — Progress Notes (Signed)
Discharge instructions reviewed with the patient and husband.  Pt being sent out to husbands waiting car

## 2016-08-24 NOTE — Discharge Summary (Signed)
[facility] NAME: [name]    MR#:  [id number]  DATE OF BIRTH:  August 29, 1945  DATE OF ADMISSION:  08/16/2016 ADMITTING PHYSICIAN: Nicholes Mango, MD  DATE OF DISCHARGE: 08/24/2016  4:00 PM  PRIMARY CARE PHYSICIAN: Singh,Jasmine, MD     ADMISSION DIAGNOSIS:  rectal bleeding GI bleed gi bleed  DISCHARGE DIAGNOSIS:  Principal Problem:   Acute diverticulitis Active Problems:   Lower GI bleed   Acute posthemorrhagic anemia   Acute respiratory failure with hypoxia (HCC)   Acute diastolic CHF (congestive heart failure) (HCC)   Leukocytosis   Fever   Diarrhea   Hyponatremia   Generalized weakness   SECONDARY DIAGNOSIS:   Past Medical History:  Diagnosis Date  . ACE-inhibitor cough   . Allergic rhinitis   . Anemia    iron deficiency anemia  . Anxiety   . Arthritis   . Asthma   . Cataract   . Chronic depression   . Chronic headache   . COPD (chronic obstructive pulmonary disease) ([place])   . Coronary artery disease    a. s/p CABG x2 (LIMA-LAD and SVG-RCA)  b. NST neg for ischemia 07/24/14  . Diabetes mellitus    type 2  . Diverticulitis of colon   . GERD (gastroesophageal reflux disease)   . H/O aortic valve replacement   . Hearing loss   . History of blood transfusion 2013  . HOH (hard of hearing)   . Hypercholesterolemia    intolerance of statins and niaspan  . OSA (obstructive sleep apnea)    mild, intolerant of cpap  . PONV (postoperative nausea and vomiting)   . Thyroid disease     .pro HOSPITAL COURSE:   The patient is a 71 year old Caucasian female with past medical history significant for history of coronary artery disease, coronary artery bypass graft, COPD, diabetes, hypothyroidism, diverticulitis in the past, status post partial colectomy, who presents to the hospital with complaints of rectal bleeding, left lower quadrant abdominal pain, which started on the day of admission. CT of  abdomen and pelvis revealed acute diverticulitis and patient was admitted. She had few episodes of active bleeding and was evaluated by vascular surgery and underwent aortogram and selective angiogram of IMA, including selective imaging of left colonic and 2 sigmoid branches of IMA, MicroBid embolization of left colonic branch and 2 sigmoidal branches of the IMA was performed 08/16/2016 by Dr. Lucky Cowboy. Post procedure patient did well, her bleeding subsided,, she was continued on broad-spectrum antibiotic therapy for diverticulitis. While resuscitated with IV fluids, patient developed hypoxia, chest x-ray revealed fluid retention, pleural effusions, concerning for congestive heart failure, requiring diuresis. She was managed conservatively with IV Lasix, later changed to oral. Slowly she improved with conservative therapy. She was evaluated by physical therapist and recommended skilled nursing facility placement, however, refused placement choosing home health services instead. She was felt to be stable to be discharged home today . Discussion by problem: #Acute diverticulitis with bleed. Lactic acid level was normal, unlikely ischemic colitis per surgery, status post embolization of IMA by Dr. Lucky Cowboy. White blood cell count has improved, bleeding has stopped, hemoglobin level remains stable,  fever subsided,  repeated CT scan of abdomen and pelvis with contrast showed improvement, diet was advanced. The patient is stable to be discharged home today. Continue Cipro and Flagyl for 7 more days  #Acute posthemorrhagic anemia.  Status post embolization of IMA by  vascular surgery Dr. Lucky Cowboy  on 08/16/2016. Gen. surgery  Dr. Hampton Abbot followed the patient  and recommended colectomy if bleeding was uncontrollable following embolization,  outpatient follow-up . Patient had history of colectomy in the past for acute diverticulitis-Status post 4 units of blood transfusion.  Holding aspirin and Effient until restarted by primary care  physician     #Insulin dependen diabetes mellitus Initiate diabetic diet, continue sliding scale insulin hemoglobin A1c 6.0, blood glucose levels ranging between 110-160  #History of coronary artery disease status post CABG Hold aspirin and Effient in view of recent GI bleed, to be decided as outpatient   #Hypothyroidism continue Synthroid TSH 1.5.normal .  # Acute respiratory failure with hypoxia due to acute diastolic CHF,  echocardiogram in December 2017, ejection fraction on normal, LVH, diastolic dysfunction,aortic bioprosthesis, elevated pulmonary pressures,   the patient received intravenous and as needed oral Lasix , oxygenation has improved. She was prequalified for oxygen, however, with continued diuresis, oxygen saturations remained good at 97% room air. She does not need oxygen therapy at home. Diuretic was not continued at home, although it may need to be started  as outpatient if continues to have shortness of breath/basilar crackles.   #Hyponatremia, sodium level has normalized   #. Leukocytosis, resolved with therapy  # Fever due to intestinal inflammation, resolved, Cipro and Flagyl for 7 more days.  # . Generalized weakness, skilled nursing facility placement for rehabilitation was recommended by physical therapist, but patient refused, arranging home health services   DISCHARGE CONDITIONS:   Stable  CONSULTS OBTAINED:    DRUG ALLERGIES:   Allergies  Allergen Reactions  . Amitriptyline Other (See Comments)    Unknown reaction  . Benadryl [Diphenhydramine] Shortness Of Breath  . Demerol [Meperidine] Other (See Comments)    Unknown reaction  . Gabapentin Other (See Comments)    Unknown reaction  . Loratadine Other (See Comments)    Unknown reaction  . Meperidine Hcl Other (See Comments)    Unknown reaction  . Mirtazapine Other (See Comments)    Unknown reaction  . Olanzapine Other (See Comments)    Unknown reaction   . Voltaren [Diclofenac  Sodium] Shortness Of Breath  . Zetia [Ezetimibe] Other (See Comments)    Weakness in legs, shakiness all over  . Ativan [Lorazepam] Other (See Comments)    Causes double vision at highter than .5 mg dose  . Atorvastatin Other (See Comments)    Muscle aches and weakness  . Budesonide-Formoterol Fumarate Other (See Comments)    Shakiness, tremors  . Bupropion Hcl Other (See Comments)    "cloud over me" depression  . Caffeine Other (See Comments)    jitters  . Codeine Sulfate Other (See Comments)    Makes chest hurt like a heart attack  . Lisinopril Cough  . Metformin Nausea And Vomiting  . Mometasone Furoate Nausea And Vomiting  . Morphine Sulfate Other (See Comments)    Chest pain like a heart attack  . Other Other (See Comments)    Beta Blockers, reaction shortness of breath  . Oxycodone-Acetaminophen Nausea And Vomiting  . Pioglitazone Other (See Comments)    Cannot take because of risk of bladder cancer  . Propoxyphene N-Acetaminophen Nausea And Vomiting  . Rosuvastatin Other (See Comments)    Muscle aches and weakness  . Shellfish Allergy Diarrhea  . Tramadol Nausea Only  . Venlafaxine Other (See Comments)    Unknown reaction  . Zolpidem Tartrate Other (See Comments)     Jittery, diarrhea  . Latex Rash  DISCHARGE MEDICATIONS:   Discharge Medication List as of 08/24/2016 12:17 PM    START taking these medications   Details  ciprofloxacin (CIPRO) 500 MG tablet Take 1 tablet (500 mg total) by mouth 2 (two) times daily., Starting Fri 08/24/2016, Normal    loperamide (IMODIUM) 2 MG capsule Take 1 capsule (2 mg total) by mouth every 6 (six) hours as needed for diarrhea or loose stools., Starting Fri 08/24/2016, Normal    metroNIDAZOLE (FLAGYL) 500 MG tablet Take 1 tablet (500 mg total) by mouth every 8 (eight) hours., Starting Fri 08/24/2016, Normal    ondansetron (ZOFRAN) 4 MG tablet Take 1 tablet (4 mg total) by mouth every 6 (six) hours as needed for nausea., Starting  Fri 08/24/2016, Normal      CONTINUE these medications which have NOT CHANGED   Details  Calcium-Vitamin D (CALTRATE 600 PLUS-VIT D PO) Take 2 tablets by mouth 2 (two) times daily. , Historical Med    esomeprazole (NEXIUM) 40 MG capsule Take 40 mg by mouth daily at 12 noon. , Historical Med    FLUoxetine (PROZAC) 40 MG capsule Take 40 mg by mouth 2 (two) times daily., Starting Fri 07/29/2015, Historical Med    insulin NPH-regular Human (NOVOLIN 70/30) (70-30) 100 UNIT/ML injection Inject 70 Units into the skin 2 (two) times daily., Historical Med    levothyroxine (SYNTHROID, LEVOTHROID) 150 MCG tablet Take 150 mcg by mouth daily before breakfast., Historical Med    traZODone (DESYREL) 100 MG tablet TAKE TWO TABLETS BY MOUTH AT BEDTIME, Normal    albuterol (PROAIR HFA) 108 (90 Base) MCG/ACT inhaler Inhale 2 puffs into the lungs every 4 (four) hours as needed for wheezing or shortness of breath., Starting Tue 07/17/2016, Normal    dimenhyDRINATE (DRAMAMINE) 50 MG tablet Take 50 mg by mouth every 6 (six) hours as needed. , Historical Med    nitroGLYCERIN (NITROSTAT) 0.4 MG SL tablet Place 0.4 mg under the tongue every 5 (five) minutes as needed for chest pain., Historical Med    ONETOUCH DELICA LANCETS 19J MISC USE TO CHECK BLOOD SUGAR THREE TIMES DAILY, Starting Mon 05/17/2014, Historical Med      STOP taking these medications     aspirin EC 81 MG tablet      prasugrel (EFFIENT) 10 MG TABS tablet          DISCHARGE INSTRUCTIONS:    The patient is to follow-up with primary care physician as outpatient  If you experience worsening of your admission symptoms, develop shortness of breath, life threatening emergency, suicidal or homicidal thoughts you must seek medical attention immediately by calling 911 or calling your MD immediately  if symptoms less severe.  You Must read complete instructions/literature along with all the possible adverse reactions/side effects for all the  Medicines you take and that have been prescribed to you. Take any new Medicines after you have completely understood and accept all the possible adverse reactions/side effects.   Please note  You were cared for by a hospitalist during your hospital stay. If you have any questions about your discharge medications or the care you received while you were in the hospital after you are discharged, you can call the unit and asked to speak with the hospitalist on call if the hospitalist that took care of you is not available. Once you are discharged, your primary care physician will handle any further medical issues. Please note that NO REFILLS for any discharge medications will be authorized once you are discharged, as  it is imperative that you return to your primary care physician (or establish a relationship with a primary care physician if you do not have one) for your aftercare needs so that they can reassess your need for medications and monitor your lab values.    Today   CHIEF COMPLAINT:   Chief Complaint  Patient presents with  . GI Bleeding    HISTORY OF PRESENT ILLNESS:  Gearldine Looney  is a 71 y.o. female with a known history of coronary artery disease, coronary artery bypass graft, COPD, diabetes, hypothyroidism, diverticulitis in the past, status post partial colectomy, who presents to the hospital with complaints of rectal bleeding, left lower quadrant abdominal pain, which started on the day of admission. CT of abdomen and pelvis revealed acute diverticulitis and patient was admitted. She had few episodes of active bleeding and was evaluated by vascular surgery and underwent aortogram and selective angiogram of IMA, including selective imaging of left colonic and 2 sigmoid branches of IMA, MicroBid embolization of left colonic branch and 2 sigmoidal branches of the IMA was performed 08/16/2016 by Dr. Lucky Cowboy. Post procedure patient did well, her bleeding subsided,, she was continued on  broad-spectrum antibiotic therapy for diverticulitis. While resuscitated with IV fluids, patient developed hypoxia, chest x-ray revealed fluid retention, pleural effusions, concerning for congestive heart failure, requiring diuresis. She was managed conservatively with IV Lasix, later changed to oral. Slowly she improved with conservative therapy. She was evaluated by physical therapist and recommended skilled nursing facility placement, however, refused placement choosing home health services instead. She was felt to be stable to be discharged home today . Discussion by problem: #Acute diverticulitis with bleed. Lactic acid level was normal, unlikely ischemic colitis per surgery, status post embolization of IMA by Dr. Lucky Cowboy. White blood cell count has improved, bleeding has stopped, hemoglobin level remains stable,  fever subsided,  repeated CT scan of abdomen and pelvis with contrast showed improvement, diet was advanced. The patient is stable to be discharged home today. Continue Cipro and Flagyl for 7 more days  #Acute posthemorrhagic anemia.  Status post embolization of IMA by  vascular surgery Dr. Lucky Cowboy on 08/16/2016. Gen. surgery  Dr. Hampton Abbot followed the patient  and recommended colectomy if bleeding was uncontrollable following embolization,  outpatient follow-up . Patient had history of colectomy in the past for acute diverticulitis-Status post 4 units of blood transfusion.  Holding aspirin and Effient until restarted by primary care physician     #Insulin dependen diabetes mellitus Initiate diabetic diet, continue sliding scale insulin hemoglobin A1c 6.0, blood glucose levels ranging between 110-160  #History of coronary artery disease status post CABG Hold aspirin and Effient in view of recent GI bleed, to be decided as outpatient   #Hypothyroidism continue Synthroid TSH 1.5.normal .  # Acute respiratory failure with hypoxia due to acute diastolic CHF,  echocardiogram in December 2017,  ejection fraction on normal, LVH, diastolic dysfunction,aortic bioprosthesis, elevated pulmonary pressures,   the patient received intravenous and as needed oral Lasix , oxygenation has improved. She was prequalified for oxygen, however, with continued diuresis, oxygen saturations remained good at 97% room air. She does not need oxygen therapy at home. Diuretic was not continued at home, although it may need to be started  as outpatient if continues to have shortness of breath/basilar crackles.   #Hyponatremia, sodium level has normalized   #. Leukocytosis, resolved with therapy  # Fever due to intestinal inflammation, resolved, Cipro and Flagyl for 7 more days.  # .  Generalized weakness, skilled nursing facility placement for rehabilitation was recommended by physical therapist, but patient refused, arranging home health services     VITAL SIGNS:  Blood pressure (!) 132/50, pulse (!) 59, temperature 98.7 F (37.1 C), temperature source Oral, resp. rate 20, height 5\' 6"  (1.676 m), weight 91.7 kg (202 lb 3.2 oz), SpO2 96 %.  I/O:   Intake/Output Summary (Last 24 hours) at 08/24/16 1640 Last data filed at 08/24/16 0900  Gross per 24 hour  Intake              600 ml  Output              720 ml  Net             -120 ml    PHYSICAL EXAMINATION:  GENERAL:  70 y.o.-year-old patient lying in the bed with no acute distress.  EYES: Pupils equal, round, reactive to light and accommodation. No scleral icterus. Extraocular muscles intact.  HEENT: Head atraumatic, normocephalic. Oropharynx and nasopharynx clear.  NECK:  Supple, no jugular venous distention. No thyroid enlargement, no tenderness.  LUNGS: Normal breath sounds bilaterally, no wheezing, rales,rhonchi or crepitation. No use of accessory muscles of respiration.  CARDIOVASCULAR: S1, S2 normal. No murmurs, rubs, or gallops.  ABDOMEN: Soft, non-tender, non-distended. Bowel sounds present. No organomegaly or mass.  EXTREMITIES: No  pedal edema, cyanosis, or clubbing.  NEUROLOGIC: Cranial nerves II through XII are intact. Muscle strength 5/5 in all extremities. Sensation intact. Gait not checked.  PSYCHIATRIC: The patient is alert and oriented x 3.  SKIN: No obvious rash, lesion, or ulcer.   DATA REVIEW:   CBC  Recent Labs Lab 08/22/16 1022  WBC 9.9  HGB 9.6*  HCT 28.6*  PLT 166    Chemistries   Recent Labs Lab 08/22/16 1551 08/24/16 1107  NA 136  --   K 3.0* 4.2  CL 100*  --   CO2 29  --   GLUCOSE 196*  --   BUN 6  --   CREATININE 0.52  --   CALCIUM 7.6*  --     Cardiac Enzymes No results for input(s): TROPONINI in the last 168 hours.  Microbiology Results  Results for orders placed or performed during the hospital encounter of 08/16/16  MRSA PCR Screening     Status: None   Collection Time: 08/16/16  6:40 PM  Result Value Ref Range Status   MRSA by PCR NEGATIVE NEGATIVE Final    Comment:        The GeneXpert MRSA Assay (FDA approved for NASAL specimens only), is one component of a comprehensive MRSA colonization surveillance program. It is not intended to diagnose MRSA infection nor to guide or monitor treatment for MRSA infections.   C difficile quick scan w PCR reflex     Status: None   Collection Time: 08/19/16  1:50 PM  Result Value Ref Range Status   C Diff antigen NEGATIVE NEGATIVE Final   C Diff toxin NEGATIVE NEGATIVE Final   C Diff interpretation No C. difficile detected.  Final  Gastrointestinal Panel by PCR , Stool     Status: None   Collection Time: 08/19/16  1:50 PM  Result Value Ref Range Status   Campylobacter species NOT DETECTED NOT DETECTED Final   Plesimonas shigelloides NOT DETECTED NOT DETECTED Final   Salmonella species NOT DETECTED NOT DETECTED Final   Yersinia enterocolitica NOT DETECTED NOT DETECTED Final   Vibrio species NOT DETECTED NOT DETECTED Final  Vibrio cholerae NOT DETECTED NOT DETECTED Final   Enteroaggregative E coli (EAEC) NOT DETECTED  NOT DETECTED Final   Enteropathogenic E coli (EPEC) NOT DETECTED NOT DETECTED Final   Enterotoxigenic E coli (ETEC) NOT DETECTED NOT DETECTED Final   Shiga like toxin producing E coli (STEC) NOT DETECTED NOT DETECTED Final   Shigella/Enteroinvasive E coli (EIEC) NOT DETECTED NOT DETECTED Final   Cryptosporidium NOT DETECTED NOT DETECTED Final   Cyclospora cayetanensis NOT DETECTED NOT DETECTED Final   Entamoeba histolytica NOT DETECTED NOT DETECTED Final   Giardia lamblia NOT DETECTED NOT DETECTED Final   Adenovirus F40/41 NOT DETECTED NOT DETECTED Final   Astrovirus NOT DETECTED NOT DETECTED Final   Norovirus GI/GII NOT DETECTED NOT DETECTED Final   Rotavirus A NOT DETECTED NOT DETECTED Final   Sapovirus (I, II, IV, and V) NOT DETECTED NOT DETECTED Final  Urine culture     Status: None   Collection Time: 08/19/16  3:09 PM  Result Value Ref Range Status   Specimen Description URINE, RANDOM  Final   Special Requests NONE  Final   Culture   Final    NO GROWTH Performed at [facility], [address], [place], [place] [address]    Report Status 08/20/2016 FINAL  Final  CULTURE, BLOOD (ROUTINE X 2) w Reflex to ID Panel     Status: None (Preliminary result)   Collection Time: 08/20/16  8:11 AM  Result Value Ref Range Status   Specimen Description BLOOD R AC  Final   Special Requests BOTTLES DRAWN AEROBIC AND ANAEROBIC BCAV  Final   Culture NO GROWTH 4 DAYS  Final   Report Status PENDING  Incomplete  CULTURE, BLOOD (ROUTINE X 2) w Reflex to ID Panel     Status: None (Preliminary result)   Collection Time: 08/20/16  8:16 AM  Result Value Ref Range Status   Specimen Description BLOOD R HAND  Final   Special Requests BOTTLES DRAWN AEROBIC AND ANAEROBIC BCAV  Final   Culture NO GROWTH 4 DAYS  Final   Report Status PENDING  Incomplete    RADIOLOGY:  Dg Chest 2 View  Result Date: 08/23/2016 CLINICAL DATA:  71 year old female with recent gastrointestinal bleeding status post  endovascular intervention. Respiratory failure, hypoxia. EXAM: CHEST  2 VIEW COMPARISON:  08/21/2016 and earlier. FINDINGS: Seated AP and lateral views of the chest. Stable cardiomegaly and mediastinal contours. Previous CABG and cardiac valve replacement. Small layering bilateral pleural effusions appear new. Stable chronic increased interstitial markings with no acute pulmonary edema. No pneumothorax or consolidation. No acute osseous abnormality identified. Negative visible bowel gas pattern. IMPRESSION: Small bilateral pleural effusions. No other acute cardiopulmonary abnormality. Chronic cardiomegaly and pulmonary interstitial changes. Electronically Signed   By: Genevie Ann M.D.   On: 08/23/2016 13:15    EKG:   Orders placed or performed in visit on 06/28/16  . EKG 12-Lead      Management plans discussed with the patient, family and they are in agreement.  CODE STATUS:     Code Status Orders        Start     Ordered   08/16/16 1059  Full code  Continuous     08/16/16 1058    Code Status History    Date Active Date Inactive Code Status Order ID Comments User Context   05/27/2016  2:39 AM 05/27/2016  7:55 PM Full Code [id number]  Carmon Ginsberg, MD Inpatient   05/25/2016 12:23 AM 05/26/2016  3:32  PM Full Code [id number]  Jay Schlichter, MD Inpatient   07/24/2014 12:10 AM 07/24/2014  7:45 PM Full Code [id number]  Manus Gunning, MD Inpatient   10/12/2013  8:32 PM 10/15/2013  7:36 PM Full Code [id number]  John Giovanni, PA-C Inpatient   10/09/2013  7:30 PM 10/12/2013  8:32 PM Full Code [id number]  [name], MD Inpatient   10/08/2013 11:12 PM 10/09/2013  7:30 PM Full Code [id number]  Stephani Police, MD ED      TOTAL TIME TAKING CARE OF THIS PATIENT: 40 minutes.    Theodoro Grist M.D on 08/24/2016 at 4:40 PM  Between 7am to 6pm - Pager - [phone]  After 6pm go to [email or web address] - password [email or web address] [facility]  Office  [phone]  CC: Primary care physician;  Glendon Axe, MD

## 2016-08-24 NOTE — Care Management Note (Addendum)
Case Management Note  Patient Details  Name: AKESHA URESTI MRN: [id number] Date of Birth: 06/18/1945  Subjective/Objective:  Discharging today                Action/Plan: Notified [facility] of discharge. Updated husband. He is in agreement with POC. He denies the need for DME.    Expected Discharge Date:  08/24/16               Expected Discharge Plan:  [place]  In-House Referral:     Discharge planning Services  CM Consult  Post Acute Care Choice:  Home Health Choice offered to:     DME Arranged:    DME Agency:     HH Arranged:  PT , RN [place] Agency:  [facility]  Status of Service:  Completed, signed off  If discussed at [place] of Stay Meetings, dates discussed:    Additional Comments:  Jolly Mango, RN 08/24/2016, 12:10 PM

## 2016-08-24 NOTE — Clinical Social Work Note (Signed)
CSW received referral for SNF.  Case discussed with case manager and plan is to discharge home with home health.  CSW to sign off please re-consult if social work needs arise.  Ebony Yorio R. Jonel Sick, MSW, LCSWA [phone]  

## 2016-08-25 LAB — CULTURE, BLOOD (ROUTINE X 2)
Culture: NO GROWTH
Culture: NO GROWTH

## 2016-08-28 ENCOUNTER — Ambulatory Visit: Payer: Medicare Other

## 2016-08-31 ENCOUNTER — Telehealth: Payer: Self-pay | Admitting: Internal Medicine

## 2016-08-31 NOTE — Telephone Encounter (Signed)
She should call GI and see when it is safe to restart ASA and Effient per GI recs. chris

## 2016-08-31 NOTE — Telephone Encounter (Signed)
New Message     When should pt resume blood thinner? Please call

## 2016-08-31 NOTE — Telephone Encounter (Signed)
Primary cardiologist is Dr. Angelena Form- will forward to him to review. Patient was recently admitted with GI bleed, aspirin and effient on hold.  Per discharge summary, these are to be resumed by PCP as outpatient.

## 2016-08-31 NOTE — Telephone Encounter (Signed)
I spoke with pt's husband and gave him information from Dr. Angelena Form. Husband states GI has told  them pt should resume medications next week.  I told husband pt should follow recommendations from GI.

## 2016-09-10 ENCOUNTER — Inpatient Hospital Stay: Payer: Medicare Other | Admitting: Surgery

## 2016-09-26 DIAGNOSIS — F411 Generalized anxiety disorder: Secondary | ICD-10-CM | POA: Insufficient documentation

## 2016-10-01 ENCOUNTER — Emergency Department
Admission: EM | Admit: 2016-10-01 | Discharge: 2016-10-01 | Discharge status: Against Medical Advice | Payer: Medicare Other

## 2016-10-09 ENCOUNTER — Ambulatory Visit
Admission: RE | Admit: 2016-10-09 | Discharge: 2016-10-09 | Disposition: A | Discharge status: Home / Self Care | Payer: Medicare Other | Source: Ambulatory Visit | Attending: Internal Medicine | Admitting: Internal Medicine

## 2016-10-09 ENCOUNTER — Other Ambulatory Visit: Payer: Self-pay | Admitting: Internal Medicine

## 2016-10-09 DIAGNOSIS — Z90710 Acquired absence of both cervix and uterus: Secondary | ICD-10-CM | POA: Diagnosis not present

## 2016-10-09 DIAGNOSIS — R1032 Left lower quadrant pain: Secondary | ICD-10-CM | POA: Diagnosis present

## 2016-10-09 DIAGNOSIS — Z9889 Other specified postprocedural states: Secondary | ICD-10-CM | POA: Diagnosis not present

## 2016-10-09 DIAGNOSIS — K5730 Diverticulosis of large intestine without perforation or abscess without bleeding: Secondary | ICD-10-CM | POA: Diagnosis not present

## 2016-10-09 DIAGNOSIS — Z9049 Acquired absence of other specified parts of digestive tract: Secondary | ICD-10-CM | POA: Diagnosis not present

## 2016-10-09 MED ORDER — IOPAMIDOL (ISOVUE-300) INJECTION 61%
100.0000 mL | Freq: Once | INTRAVENOUS | Status: AC | PRN
  Administered 2016-10-09: 100 mL via INTRAVENOUS

## 2016-10-11 ENCOUNTER — Ambulatory Visit
Admission: RE | Admit: 2016-10-11 | Discharge: 2016-10-11 | Disposition: A | Discharge status: Home / Self Care | Payer: Medicare Other | Source: Ambulatory Visit | Attending: Internal Medicine | Admitting: Internal Medicine

## 2016-10-11 DIAGNOSIS — I6782 Cerebral ischemia: Secondary | ICD-10-CM | POA: Insufficient documentation

## 2016-10-11 DIAGNOSIS — R2689 Other abnormalities of gait and mobility: Secondary | ICD-10-CM

## 2016-10-11 DIAGNOSIS — D320 Benign neoplasm of cerebral meninges: Secondary | ICD-10-CM | POA: Insufficient documentation

## 2016-10-11 DIAGNOSIS — R42 Dizziness and giddiness: Secondary | ICD-10-CM

## 2016-10-11 DIAGNOSIS — R260 Ataxic gait: Secondary | ICD-10-CM

## 2016-10-11 DIAGNOSIS — R1032 Left lower quadrant pain: Secondary | ICD-10-CM | POA: Diagnosis present

## 2016-10-11 MED ORDER — GADOBENATE DIMEGLUMINE 529 MG/ML IV SOLN
19.0000 mL | Freq: Once | INTRAVENOUS | Status: AC | PRN
  Administered 2016-10-11: 19 mL via INTRAVENOUS

## 2016-11-08 ENCOUNTER — Ambulatory Visit: Payer: Medicare Other | Admitting: Family Medicine

## 2016-11-25 ENCOUNTER — Emergency Department (HOSPITAL_COMMUNITY): Payer: Medicare Other

## 2016-11-25 ENCOUNTER — Emergency Department (HOSPITAL_COMMUNITY)
Admission: EM | Admit: 2016-11-25 | Discharge: 2016-11-25 | Disposition: A | Discharge status: Home / Self Care | Payer: Medicare Other | Attending: Emergency Medicine | Admitting: Emergency Medicine

## 2016-11-25 ENCOUNTER — Encounter (HOSPITAL_COMMUNITY): Payer: Self-pay

## 2016-11-25 DIAGNOSIS — Z794 Long term (current) use of insulin: Secondary | ICD-10-CM | POA: Insufficient documentation

## 2016-11-25 DIAGNOSIS — I2510 Atherosclerotic heart disease of native coronary artery without angina pectoris: Secondary | ICD-10-CM | POA: Diagnosis not present

## 2016-11-25 DIAGNOSIS — K5792 Diverticulitis of intestine, part unspecified, without perforation or abscess without bleeding: Secondary | ICD-10-CM

## 2016-11-25 DIAGNOSIS — I110 Hypertensive heart disease with heart failure: Secondary | ICD-10-CM | POA: Diagnosis not present

## 2016-11-25 DIAGNOSIS — K5732 Diverticulitis of large intestine without perforation or abscess without bleeding: Secondary | ICD-10-CM | POA: Insufficient documentation

## 2016-11-25 DIAGNOSIS — I252 Old myocardial infarction: Secondary | ICD-10-CM | POA: Diagnosis not present

## 2016-11-25 DIAGNOSIS — Z91040 Latex allergy status: Secondary | ICD-10-CM | POA: Insufficient documentation

## 2016-11-25 DIAGNOSIS — R109 Unspecified abdominal pain: Secondary | ICD-10-CM | POA: Diagnosis present

## 2016-11-25 DIAGNOSIS — Z79899 Other long term (current) drug therapy: Secondary | ICD-10-CM | POA: Insufficient documentation

## 2016-11-25 DIAGNOSIS — E119 Type 2 diabetes mellitus without complications: Secondary | ICD-10-CM | POA: Insufficient documentation

## 2016-11-25 DIAGNOSIS — I5031 Acute diastolic (congestive) heart failure: Secondary | ICD-10-CM | POA: Insufficient documentation

## 2016-11-25 DIAGNOSIS — J449 Chronic obstructive pulmonary disease, unspecified: Secondary | ICD-10-CM | POA: Diagnosis not present

## 2016-11-25 DIAGNOSIS — Z951 Presence of aortocoronary bypass graft: Secondary | ICD-10-CM | POA: Insufficient documentation

## 2016-11-25 DIAGNOSIS — Z87891 Personal history of nicotine dependence: Secondary | ICD-10-CM | POA: Insufficient documentation

## 2016-11-25 LAB — URINALYSIS, ROUTINE W REFLEX MICROSCOPIC
Bilirubin Urine: NEGATIVE
Glucose, UA: NEGATIVE mg/dL
HGB URINE DIPSTICK: NEGATIVE
Ketones, ur: NEGATIVE mg/dL
Leukocytes, UA: NEGATIVE
Nitrite: NEGATIVE
Protein, ur: NEGATIVE mg/dL
Specific Gravity, Urine: 1.018 (ref 1.005–1.030)
pH: 5 (ref 5.0–8.0)

## 2016-11-25 LAB — COMPREHENSIVE METABOLIC PANEL
ALK PHOS: 93 U/L (ref 38–126)
ALT: 11 U/L — ABNORMAL LOW (ref 14–54)
ANION GAP: 9 (ref 5–15)
AST: 17 U/L (ref 15–41)
Albumin: 3.1 g/dL — ABNORMAL LOW (ref 3.5–5.0)
BUN: 18 mg/dL (ref 6–20)
CALCIUM: 8.4 mg/dL — AB (ref 8.9–10.3)
CHLORIDE: 105 mmol/L (ref 101–111)
CO2: 23 mmol/L (ref 22–32)
Creatinine, Ser: 1.12 mg/dL — ABNORMAL HIGH (ref 0.44–1.00)
GFR calc non Af Amer: 48 mL/min — ABNORMAL LOW (ref >60)
GFR, EST AFRICAN AMERICAN: 55 mL/min — AB (ref >60)
Glucose, Bld: 137 mg/dL — ABNORMAL HIGH (ref 65–99)
POTASSIUM: 4.7 mmol/L (ref 3.5–5.1)
SODIUM: 137 mmol/L (ref 135–145)
Total Bilirubin: 0.6 mg/dL (ref 0.3–1.2)
Total Protein: 6.7 g/dL (ref 6.5–8.1)

## 2016-11-25 LAB — CBC
HCT: 34.4 % — ABNORMAL LOW (ref 36.0–46.0)
Hemoglobin: 10.7 g/dL — ABNORMAL LOW (ref 12.0–15.0)
MCH: 23.9 pg — AB (ref 26.0–34.0)
MCHC: 31.1 g/dL (ref 30.0–36.0)
MCV: 77 fL — ABNORMAL LOW (ref 78.0–100.0)
Platelets: 180 10*3/uL (ref 150–400)
RBC: 4.47 MIL/uL (ref 3.87–5.11)
RDW: 16.5 % — ABNORMAL HIGH (ref 11.5–15.5)
WBC: 8.5 10*3/uL (ref 4.0–10.5)

## 2016-11-25 LAB — LIPASE, BLOOD: LIPASE: 205 U/L — AB (ref 11–51)

## 2016-11-25 MED ORDER — ONDANSETRON HCL 4 MG/2ML IJ SOLN
4.0000 mg | Freq: Once | INTRAMUSCULAR | Status: AC
  Administered 2016-11-25: 4 mg via INTRAVENOUS
  Filled 2016-11-25: qty 2

## 2016-11-25 MED ORDER — METRONIDAZOLE 500 MG PO TABS
500.0000 mg | ORAL_TABLET | Freq: Once | ORAL | Status: AC
  Administered 2016-11-25: 500 mg via ORAL
  Filled 2016-11-25: qty 1

## 2016-11-25 MED ORDER — HYDROMORPHONE HCL 1 MG/ML IJ SOLN
0.5000 mg | Freq: Once | INTRAMUSCULAR | Status: AC
  Administered 2016-11-25: 0.5 mg via INTRAVENOUS
  Filled 2016-11-25: qty 1

## 2016-11-25 MED ORDER — CIPROFLOXACIN HCL 500 MG PO TABS: 500.0000 mg | ORAL_TABLET | Freq: Two times a day (BID) | ORAL | 0 refills | Status: DC

## 2016-11-25 MED ORDER — ONDANSETRON 4 MG PO TBDP: ORAL_TABLET | ORAL | 0 refills | Status: DC

## 2016-11-25 MED ORDER — METRONIDAZOLE 500 MG PO TABS: 500.0000 mg | ORAL_TABLET | Freq: Three times a day (TID) | ORAL | 0 refills | Status: DC

## 2016-11-25 MED ORDER — IOPAMIDOL (ISOVUE-300) INJECTION 61%
INTRAVENOUS | Status: AC
  Administered 2016-11-25: 100 mL
  Filled 2016-11-25: qty 100

## 2016-11-25 MED ORDER — SODIUM CHLORIDE 0.9 % IV BOLUS (SEPSIS)
1000.0000 mL | Freq: Once | INTRAVENOUS | Status: AC
  Administered 2016-11-25: 1000 mL via INTRAVENOUS

## 2016-11-25 MED ORDER — HYDROMORPHONE HCL 2 MG PO TABS: 1.0000 mg | ORAL_TABLET | ORAL | 0 refills | Status: DC | PRN

## 2016-11-25 MED ORDER — CIPROFLOXACIN HCL 500 MG PO TABS
500.0000 mg | ORAL_TABLET | Freq: Once | ORAL | Status: AC
  Administered 2016-11-25: 500 mg via ORAL
  Filled 2016-11-25: qty 1

## 2016-11-25 NOTE — ED Provider Notes (Signed)
[facility] DEPT Provider Note   CSN: [id number] Arrival date & time: 11/25/16  1943     History   Chief Complaint Chief Complaint  Patient presents with  . Abdominal Pain    HPI [name] is a 71 y.o. female.  Patient is a 60 female with a history of coronary artery disease, COPD, diabetes, diverticulosis who presents with abdominal pain. She was admitted to [facility] in March of this year with acute diverticulitis. She ended up having a GI bleed relating to the diverticulitis and had embolization during the hospital stay. She states since discharge she's been having ongoing issues with her abdomen. She alternates between diarrhea and constipation every few days. She states today the pain in her abdomen is gotten much worse where she has now constant pain in the left side of her abdomen. She hasn't had bowel movement in 2 days. This is been typical for her over the last few months. She denies any fevers. She's had some nausea but no vomiting. No urinary symptoms. She did take a Dulcolax earlier with no improvement in symptoms. She denies any blood in her stool.      Past Medical History:  Diagnosis Date  . ACE-inhibitor cough   . Allergic rhinitis   . Anemia    iron deficiency anemia  . Anxiety   . Arthritis   . Asthma   . Cataract   . Chronic depression   . Chronic headache   . COPD (chronic obstructive pulmonary disease) ([place])   . Coronary artery disease    a. s/p CABG x2 (LIMA-LAD and SVG-RCA)  b. NST neg for ischemia 07/24/14  . Diabetes mellitus    type 2  . Diverticulitis of colon   . GERD (gastroesophageal reflux disease)   . H/O aortic valve replacement   . Hearing loss   . History of blood transfusion 2013  . HOH (hard of hearing)   . Hypercholesterolemia    intolerance of statins and niaspan  . OSA (obstructive sleep apnea)    mild, intolerant of cpap  . PONV (postoperative nausea and vomiting)   . Thyroid disease     Patient Active  Problem List   Diagnosis Date Noted  . Acute posthemorrhagic anemia 08/24/2016  . Acute respiratory failure with hypoxia ([place]) 08/24/2016  . Acute diastolic CHF (congestive heart failure) ([place]) 08/24/2016  . Hyponatremia 08/24/2016  . Leukocytosis 08/24/2016  . Fever 08/24/2016  . Diarrhea 08/24/2016  . Generalized weakness 08/24/2016  . Acute diverticulitis 08/16/2016  . Lower GI bleed   . NSTEMI (non-ST elevated myocardial infarction) ([place])   . AVB (atrioventricular block)   . Atypical chest pain   . Unstable angina ([place]) 05/25/2016  . Palpitations 05/25/2016  . PUD - recent Rx for H.Pylori 05/25/2016  . Stenosis of coronary artery stent   . Recurrent major depressive disorder, in partial remission ([place]) 07/29/2015  . Parotiditis 04/25/2015  . Viral URI 03/24/2015  . Hypoglycemia 08/25/2014  . Nausea without vomiting 08/25/2014  . Dyslipidemia-statin ontol   . Long-term insulin use ([place]) 03/31/2014  . Aortic valve disorder 11/11/2013  . 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 Coronary Artery Disease s/p CABG 10/2013 10/10/2013  . Other malaise and fatigue 09/11/2013  . Alopecia 03/06/2013  . Insomnia 02/06/2013  . Gastroesophageal reflux disease without esophagitis 01/19/2013  . Dysphagia, unspecified(787.20) 01/19/2013  . Dyspnea 11/17/2012  . Weakness 11/17/2012  . Abnormal MRI of head 09/05/2012  . Depression with anxiety 03/21/2012  .  Extrinsic asthma 11/12/2011  . Anemia 06/07/2011  . Chronic obstructive pulmonary disease ([place]) 01/25/2010  . Acquired hypothyroidism 08/10/2009  . MICROSCOPIC HEMATURIA 05/07/2009  . GOITER 03/02/2009  . Essential hypertension 01/06/2009  . MEMORY LOSS 06/23/2008  . Obstructive sleep apnea 12/25/2007  . Insulin dependent diabetes mellitus ([place]) 09/09/2006  . ALLERGIC RHINITIS 09/09/2006  . DIVERTICULOSIS, COLON 09/09/2006    Past Surgical History:  Procedure Laterality Date  . ABDOMINAL HYSTERECTOMY    . ABDOMINAL HYSTERECTOMY W/  PARTIAL VAGINACTOMY    . AORTIC VALVE REPLACEMENT N/A 10/12/2013   Procedure: AORTIC VALVE REPLACEMENT (AVR);  Surgeon: [name], MD;  Location: [facility];  Service: Open Heart Surgery;  Laterality: N/A;  . [place]  . BARTHOLIN GLAND CYST EXCISION    . BLADDER SUSPENSION    . BREAST BIOPSY Bilateral 09/11/2000   neg  . BREAST BIOPSY Left 07/24/2010   neg  . BREAST CYST EXCISION  1988   bilateral nonmalignant tumors, x3  . CARDIAC CATHETERIZATION    . CARDIAC CATHETERIZATION N/A 05/25/2016   Procedure: Coronary Balloon Angioplasty;  Surgeon: Leonie Man, MD;  Location: [facility];  Service: Cardiovascular;  Laterality: N/A;  . CARDIAC CATHETERIZATION N/A 05/25/2016   Procedure: Coronary/Graft Angiography;  Surgeon: Leonie Man, MD;  Location: [place] [facility];  Service: Cardiovascular;  Laterality: N/A;  . CATARACT EXTRACTION W/ INTRAOCULAR LENS  IMPLANT, BILATERAL    . CHOLECYSTECTOMY  2001  . COLECTOMY     lap sigmoid  . COLONOSCOPY  2014   polyps found, 2 clamped off.  . CORONARY ANGIOPLASTY  10/29/2007   Prox RCA & Mid Cx.  . CORONARY ARTERY BYPASS GRAFT N/A 10/12/2013   Procedure: CORONARY ARTERY BYPASS GRAFT TIMES TWO;  Surgeon: [name], MD;  Location: [facility];  Service: Open Heart Surgery;  Laterality: N/A;  . LEFT HEART CATHETERIZATION WITH CORONARY ANGIOGRAM N/A 10/09/2013   Procedure: LEFT HEART CATHETERIZATION WITH CORONARY ANGIOGRAM;  Surgeon: [name], MD;  Location: [facility];  Service: Cardiovascular;  Laterality: N/A;  . STERNAL WIRES REMOVAL N/A 04/13/2014   Procedure: STERNAL WIRES REMOVAL;  Surgeon: [name], MD;  Location: [facility] OR;  Service: Thoracic;  Laterality: N/A;  . THYROIDECTOMY    . TONSILLECTOMY    . TUBAL LIGATION    . VAGINAL DELIVERY     3  . VISCERAL ARTERY INTERVENTION N/A 08/16/2016   Procedure: Visceral Artery Intervention;  Surgeon: Algernon Huxley, MD;  Location: [facility];  Service:  Cardiovascular;  Laterality: N/A;    OB History    Gravida Para Term Preterm AB Living   3 3       3    SAB TAB Ectopic Multiple Live Births                  Obstetric Comments   1st Menstrual Cycle: 10 1st Pregnancy: 19       Home Medications    Prior to Admission medications   Medication Sig Start Date End Date Taking? Authorizing Provider  albuterol (PROAIR HFA) 108 (90 Base) MCG/ACT inhaler Inhale 2 puffs into the lungs every 4 (four) hours as needed for wheezing or shortness of breath. 07/17/16   Flora Lipps, MD  Calcium-Vitamin D (CALTRATE 600 PLUS-VIT D PO) Take 2 tablets by mouth 2 (two) times daily.     [provider]  ciprofloxacin (CIPRO) 500 MG tablet Take 1 tablet (500 mg total)  by mouth 2 (two) times daily. One po bid x 7 days 11/25/16   Malvin Johns, MD  dimenhyDRINATE (DRAMAMINE) 50 MG tablet Take 50 mg by mouth every 6 (six) hours as needed.     [provider]  esomeprazole (NEXIUM) 40 MG capsule Take 40 mg by mouth daily at 12 noon.     [provider]  FLUoxetine (PROZAC) 40 MG capsule Take 40 mg by mouth 2 (two) times daily. 07/29/15   [provider]  HYDROmorphone (DILAUDID) 2 MG tablet Take 0.5 tablets (1 mg total) by mouth every 4 (four) hours as needed for severe pain. 11/25/16   Malvin Johns, MD  insulin NPH-regular Human (NOVOLIN 70/30) (70-30) 100 UNIT/ML injection Inject 70 Units into the skin 2 (two) times daily.    [provider]  levothyroxine (SYNTHROID, LEVOTHROID) 150 MCG tablet Take 150 mcg by mouth daily before breakfast.    [provider]  loperamide (IMODIUM) 2 MG capsule Take 1 capsule (2 mg total) by mouth every 6 (six) hours as needed for diarrhea or loose stools. 08/24/16   Theodoro Grist, MD  metroNIDAZOLE (FLAGYL) 500 MG tablet Take 1 tablet (500 mg total) by mouth 3 (three) times daily. One po bid x 7 days 11/25/16   Malvin Johns, MD  nitroGLYCERIN (NITROSTAT) 0.4 MG SL tablet Place  0.4 mg under the tongue every 5 (five) minutes as needed for chest pain.    [provider]  ondansetron (ZOFRAN ODT) 4 MG disintegrating tablet 4mg  ODT q4 hours prn nausea/vomit 11/25/16   Malvin Johns, MD  ondansetron (ZOFRAN) 4 MG tablet Take 1 tablet (4 mg total) by mouth every 6 (six) hours as needed for nausea. 08/24/16   Theodoro Grist, MD  [facility] DELICA LANCETS 56O MISC USE TO CHECK BLOOD SUGAR THREE TIMES DAILY 05/17/14   [provider]  traZODone (DESYREL) 100 MG tablet TAKE TWO TABLETS BY MOUTH AT BEDTIME 08/15/15   Lucille Passy, MD    Family History Family History  Problem Relation Age of Onset  . Breast cancer Mother 57  . Hypertension Father   . Mesothelioma Father   . Asthma Father   . Stroke Paternal Grandfather   . Heart disease Unknown   . Breast cancer Maternal Aunt   . Breast cancer Paternal Aunt     Social History Social History  Substance Use Topics  . Smoking status: Former Smoker    Packs/day: 0.50    Years: 30.00    Types: Cigarettes    Quit date: 10/02/2013  . Smokeless tobacco: Never Used  . Alcohol use No     Allergies   Amitriptyline; Benadryl [diphenhydramine]; Demerol [meperidine]; Gabapentin; Loratadine; Meperidine hcl; Mirtazapine; Olanzapine; Voltaren [diclofenac sodium]; Zetia [ezetimibe]; Ativan [lorazepam]; Atorvastatin; Budesonide-formoterol fumarate; Bupropion hcl; Caffeine; Codeine sulfate; Lisinopril; Metformin; Mometasone furoate; Morphine sulfate; Other; Oxycodone-acetaminophen; Pioglitazone; Propoxyphene n-acetaminophen; Rosuvastatin; Shellfish allergy; Tramadol; Venlafaxine; Zolpidem tartrate; and Latex   Review of Systems Review of Systems  Constitutional: Negative for chills, diaphoresis, fatigue and fever.  HENT: Negative for congestion, rhinorrhea and sneezing.   Eyes: Negative.   Respiratory: Negative for cough, chest tightness and shortness of breath.   Cardiovascular: Negative for chest pain and leg  swelling.  Gastrointestinal: Positive for abdominal pain, constipation and nausea. Negative for blood in stool, diarrhea and vomiting.  Genitourinary: Negative for difficulty urinating, flank pain, frequency and hematuria.  Musculoskeletal: Negative for arthralgias and back pain.  Skin: Negative for rash.  Neurological: Negative for dizziness, speech  difficulty, weakness, numbness and headaches.     Physical Exam Updated Vital Signs BP (!) 147/56   Pulse 65   Temp 98 F (36.7 C) (Oral)   Resp 17   Ht 5\' 6"  (1.676 m)   Wt 81.6 kg (180 lb)   SpO2 96%   BMI 29.05 kg/m   Physical Exam  Constitutional: She is oriented to person, place, and time. She appears well-developed and well-nourished.  HENT:  Head: Normocephalic and atraumatic.  Eyes: Pupils are equal, round, and reactive to light.  Neck: Normal range of motion. Neck supple.  Cardiovascular: Normal rate and regular rhythm.   Murmur heard. Pulmonary/Chest: Effort normal and breath sounds normal. No respiratory distress. She has no wheezes. She has no rales. She exhibits no tenderness.  Abdominal: Soft. Bowel sounds are normal. There is tenderness (Moderate tenderness to left mid and lower abdomen). There is no rebound and no guarding.  Musculoskeletal: Normal range of motion. She exhibits no edema.  Lymphadenopathy:    She has no cervical adenopathy.  Neurological: She is alert and oriented to person, place, and time.  Skin: Skin is warm and dry. No rash noted.  Psychiatric: She has a normal mood and affect.     ED Treatments / Results  Labs (all labs ordered are listed, but only abnormal results are displayed) Labs Reviewed  LIPASE, BLOOD - Abnormal; Notable for the following:       Result Value   Lipase 205 (*)    All other components within normal limits  COMPREHENSIVE METABOLIC PANEL - Abnormal; Notable for the following:    Glucose, Bld 137 (*)    Creatinine, Ser 1.12 (*)    Calcium 8.4 (*)    Albumin 3.1  (*)    ALT 11 (*)    GFR calc non Af Amer 48 (*)    GFR calc Af Amer 55 (*)    All other components within normal limits  CBC - Abnormal; Notable for the following:    Hemoglobin 10.7 (*)    HCT 34.4 (*)    MCV 77.0 (*)    MCH 23.9 (*)    RDW 16.5 (*)    All other components within normal limits  URINALYSIS, ROUTINE W REFLEX MICROSCOPIC    EKG  EKG Interpretation None       Radiology Ct Abdomen Pelvis W Contrast  Result Date: 11/25/2016 CLINICAL DATA:  Abdominal pain. Hx GERD, diverticulitis, diabetes, tubal ligation, colectomy, cholecystectomy, bladder suspension, appendectomy, and abdominal hystetectomy with partial vaginactomy. EXAM: CT ABDOMEN AND PELVIS WITH CONTRAST TECHNIQUE: Multidetector CT imaging of the abdomen and pelvis was performed using the standard protocol following bolus administration of intravenous contrast. CONTRAST:  140mL ISOVUE-300 IOPAMIDOL (ISOVUE-300) INJECTION 61% COMPARISON:  10/09/2016 FINDINGS: Lower chest: No acute abnormality. Hepatobiliary: Liver shows some central volume loss and subtle surface nodularity. Consider cirrhosis in the proper clinical setting. No liver mass or focal lesion. Status post cholecystectomy. No biliary dilatation. Pancreas: Unremarkable. No pancreatic ductal dilatation or surrounding inflammatory changes. Spleen: Borderline enlarged measuring 12.8 cm in greatest dimension. This is stable from the prior study. Adrenals/Urinary Tract: Adrenal glands are unremarkable. Kidneys are normal, without renal calculi, focal lesion, or hydronephrosis. Bladder is unremarkable. Stomach/Bowel: There is subtle hazy inflammatory type change adjacent to a diverticulum along the proximal sigmoid colon which represents a slight interval change from the prior CT. There multiple additional left colon diverticula with no other evidence of inflammation. An anastomosis staple line extends along the lower  sigmoid colon, stable. The stomach and small bowel  are unremarkable. Vascular/Lymphatic: Aortic atherosclerosis. Small focal 2.7 cm aneurysm of the infrarenal aorta. Several prominent, but not pathologically enlarged, gastrohepatic lymph nodes are noted. There are no pathologically enlarged abdominal or pelvic lymph nodes. These findings are stable from the prior study. Reproductive: Status post hysterectomy. No adnexal masses. Other: No abdominal wall hernia or abnormality. No abdominopelvic ascites. Musculoskeletal: No fracture or acute finding. No osteoblastic or osteolytic lesions. IMPRESSION: 1. Subtle findings of uncomplicated diverticulitis of the proximal sigmoid colon. No extraluminal air or abscess. 2. No other acute abnormalities. 3. Possible cirrhosis. Borderline splenomegaly. This appearance is stable from the prior CT. 4. 2.7 cm abdominal aortic aneurysm with associated atherosclerosis. Ectatic abdominal aorta at risk for aneurysm development. Recommend followup by ultrasound in 5 years. This recommendation follows ACR consensus guidelines: White Paper of the ACR Incidental Findings Committee II on Vascular Findings. J Am Coll Radiol 2013; 10:789-794. Electronically Signed   By: Lajean Manes M.D.   On: 11/25/2016 22:24    Procedures Procedures (including critical care time)  Medications Ordered in ED Medications  sodium chloride 0.9 % bolus 1,000 mL (0 mLs Intravenous Stopped 11/25/16 2222)  HYDROmorphone (DILAUDID) injection 0.5 mg (0.5 mg Intravenous Given 11/25/16 2121)  ondansetron (ZOFRAN) injection 4 mg (4 mg Intravenous Given 11/25/16 2131)  iopamidol (ISOVUE-300) 61 % injection (100 mLs  Contrast Given 11/25/16 2156)  ciprofloxacin (CIPRO) tablet 500 mg (500 mg Oral Given 11/25/16 2319)  metroNIDAZOLE (FLAGYL) tablet 500 mg (500 mg Oral Given 11/25/16 2319)  HYDROmorphone (DILAUDID) injection 0.5 mg (0.5 mg Intravenous Given 11/25/16 2318)     Initial Impression / Assessment and Plan / ED Course  I have reviewed the triage vital  signs and the nursing notes.  Pertinent labs & imaging results that were available during my care of the patient were reviewed by me and considered in my medical decision making (see chart for details).     Patient has evidence of diverticulitis on CT. Appears to be mild. She has a mild elevation in her lipase. Otherwise her labs are okay. Her creatinine is very minimally bump. She was given IV fluids and a dose of antibiotics. She wants to go home for treatment. I feel that it's reasonable to give a 24-hour outpatient trial. She was given prescriptions for Cipro and Flagyl as well as pain medication and Zofran. She was encouraged to have close follow-up with her PCP. Strict return precautions were given. She was also advised that she had a mildly elevated lipase and a 2.8 cm AAA which will need outpatient follow-up by her PCP.  Final Clinical Impressions(s) / ED Diagnoses   Final diagnoses:  Diverticulitis    New Prescriptions Discharge Medication List as of 11/25/2016 10:53 PM    START taking these medications   Details  HYDROmorphone (DILAUDID) 2 MG tablet Take 0.5 tablets (1 mg total) by mouth every 4 (four) hours as needed for severe pain., Starting Sun 11/25/2016, Print    ondansetron (ZOFRAN ODT) 4 MG disintegrating tablet 4mg  ODT q4 hours prn nausea/vomit, Print         Malvin Johns, MD 11/26/16 0001

## 2016-11-25 NOTE — ED Notes (Signed)
Patient transported to CT 

## 2016-11-25 NOTE — ED Triage Notes (Signed)
Pt complaining of left lower abdominal pain x 1 day. Pt states recently hospitalized for divirticulitis. Pt denies any emesis or diarrhea. Pt states LBM 6/22. Pt denies any dysuria.

## 2016-11-25 NOTE — Discharge Instructions (Signed)
YOU HAVE AN ELEVATED LIPASE AT 200 WHICH SHOULD BE RECHECKED BY YOUR DOCTOR.  YOU HAVE A 2.8 CM ABDOMINAL AORTIC ANEURYSM THAT NEEDS TO BE FOLLOWED.

## 2016-11-26 ENCOUNTER — Telehealth: Payer: Self-pay | Admitting: *Deleted

## 2016-11-26 NOTE — Telephone Encounter (Signed)
Pharmacy called related to Rx: Flagy directions to take 1 tablet (500 mg total) by mouth 3 (three) times daily. One po bid x 7 days .Marland KitchenMarland KitchenEDCM clarified with EDP ([name]) to change Rx to: Take one po bid x 7 days.

## 2016-12-03 ENCOUNTER — Telehealth: Payer: Self-pay | Admitting: Pharmacist

## 2016-12-03 NOTE — Telephone Encounter (Signed)
Called pt regarding lipid management - pt last seen by Dr Angelena Form in March and note stated that pt was willing to try PCSK9i however no f/u was ever scheduled. Spoke with pt/husband who stated that they were concerned with cost of medication. Advised that there are patient assistance programs available to help with cost of medication. They then stated that pt is not interested in pursuing PCSK9i at this time because of her other health concerns. Advised pt to call clinic once she is willing to consider PCSK9i therapy.

## 2016-12-04 ENCOUNTER — Other Ambulatory Visit: Payer: Self-pay | Admitting: Internal Medicine

## 2016-12-04 ENCOUNTER — Ambulatory Visit
Admission: RE | Admit: 2016-12-04 | Discharge: 2016-12-04 | Disposition: A | Discharge status: Home / Self Care | Payer: Medicare Other | Source: Ambulatory Visit | Attending: Internal Medicine | Admitting: Internal Medicine

## 2016-12-04 DIAGNOSIS — I77811 Abdominal aortic ectasia: Secondary | ICD-10-CM | POA: Insufficient documentation

## 2016-12-04 DIAGNOSIS — R1084 Generalized abdominal pain: Secondary | ICD-10-CM

## 2016-12-04 DIAGNOSIS — I708 Atherosclerosis of other arteries: Secondary | ICD-10-CM | POA: Insufficient documentation

## 2016-12-04 DIAGNOSIS — I700 Atherosclerosis of aorta: Secondary | ICD-10-CM | POA: Insufficient documentation

## 2016-12-04 DIAGNOSIS — K5792 Diverticulitis of intestine, part unspecified, without perforation or abscess without bleeding: Secondary | ICD-10-CM | POA: Insufficient documentation

## 2016-12-04 MED ORDER — GADOBENATE DIMEGLUMINE 529 MG/ML IV SOLN
17.0000 mL | Freq: Once | INTRAVENOUS | Status: AC | PRN
  Administered 2016-12-04: 17 mL via INTRAVENOUS

## 2016-12-31 ENCOUNTER — Ambulatory Visit (INDEPENDENT_AMBULATORY_CARE_PROVIDER_SITE_OTHER): Payer: Medicare Other | Admitting: Vascular Surgery

## 2016-12-31 ENCOUNTER — Encounter (INDEPENDENT_AMBULATORY_CARE_PROVIDER_SITE_OTHER): Payer: Self-pay | Admitting: Vascular Surgery

## 2016-12-31 VITALS — BP 156/71 | HR 57 | Resp 16 | Ht 66.0 in | Wt 180.0 lb

## 2016-12-31 DIAGNOSIS — I10 Essential (primary) hypertension: Secondary | ICD-10-CM

## 2016-12-31 DIAGNOSIS — E785 Hyperlipidemia, unspecified: Secondary | ICD-10-CM

## 2016-12-31 DIAGNOSIS — E119 Type 2 diabetes mellitus without complications: Secondary | ICD-10-CM

## 2016-12-31 DIAGNOSIS — Z794 Long term (current) use of insulin: Secondary | ICD-10-CM

## 2016-12-31 DIAGNOSIS — IMO0001 Reserved for inherently not codable concepts without codable children: Secondary | ICD-10-CM

## 2016-12-31 DIAGNOSIS — I2510 Atherosclerotic heart disease of native coronary artery without angina pectoris: Secondary | ICD-10-CM | POA: Diagnosis not present

## 2016-12-31 DIAGNOSIS — I716 Thoracoabdominal aortic aneurysm, without rupture, unspecified: Secondary | ICD-10-CM

## 2016-12-31 NOTE — Progress Notes (Signed)
MRN : [id number]  [name] is a 71 y.o. ([birth year]) female who presents with chief complaint of  Chief Complaint  Patient presents with  . New Evaluation    AAA  .  History of Present Illness:   The patient presents to the office for evaluation of an abdominal aortic aneurysm. The aneurysm was found incidentally by CT scan. Patient denies abdominal pain or unusual back pain, no other abdominal complaints.  No history of an acute onset of painful blue discoloration of the toes.     + family history of AAA.   Patient denies amaurosis fugax or TIA symptoms. There is no history of claudication or rest pain symptoms of the lower extremities.  The patient denies angina or shortness of breath.  CT scan shows an AAA that measures 5.00 cm    Current Meds  Medication Sig  . albuterol (PROAIR HFA) 108 (90 Base) MCG/ACT inhaler Inhale 2 puffs into the lungs every 4 (four) hours as needed for wheezing or shortness of breath.  . Calcium-Vitamin D (CALTRATE 600 PLUS-VIT D PO) Take 2 tablets by mouth 2 (two) times daily.   Marland Kitchen esomeprazole (NEXIUM) 40 MG packet Take by mouth.  Marland Kitchen FLUoxetine (PROZAC) 40 MG capsule Take 40 mg by mouth 2 (two) times daily.  . insulin NPH-regular Human (NOVOLIN 70/30) (70-30) 100 UNIT/ML injection Inject 70 Units into the skin 2 (two) times daily.  Marland Kitchen levothyroxine (SYNTHROID, LEVOTHROID) 150 MCG tablet Take 150 mcg by mouth daily before breakfast.  . nitroGLYCERIN (NITROSTAT) 0.4 MG SL tablet Place 0.4 mg under the tongue every 5 (five) minutes as needed for chest pain.  Marland Kitchen ondansetron (ZOFRAN) 4 MG tablet Take 1 tablet (4 mg total) by mouth every 6 (six) hours as needed for nausea.  [name] DELICA LANCETS 35T MISC USE TO CHECK BLOOD SUGAR THREE TIMES DAILY  . traZODone (DESYREL) 100 MG tablet TAKE TWO TABLETS BY MOUTH AT BEDTIME  . [DISCONTINUED] busPIRone (BUSPAR) 10 MG tablet   . [DISCONTINUED] dicyclomine (BENTYL) 20 MG tablet Take by mouth.  .  [DISCONTINUED] dimenhyDRINATE (DRAMAMINE) 50 MG tablet Take 50 mg by mouth every 6 (six) hours as needed.   . [DISCONTINUED] furosemide (LASIX) 20 MG tablet   . [DISCONTINUED] HYDROmorphone (DILAUDID) 2 MG tablet Take 0.5 tablets (1 mg total) by mouth every 4 (four) hours as needed for severe pain.  . [DISCONTINUED] loperamide (IMODIUM) 2 MG capsule Take 1 capsule (2 mg total) by mouth every 6 (six) hours as needed for diarrhea or loose stools.  . [DISCONTINUED] pantoprazole (PROTONIX) 20 MG tablet Take by mouth.  . [DISCONTINUED] prasugrel (EFFIENT) 10 MG TABS tablet Take by mouth.    Past Medical History:  Diagnosis Date  . ACE-inhibitor cough   . Allergic rhinitis   . Anemia    iron deficiency anemia  . Anxiety   . Arthritis   . Asthma   . Cataract   . Chronic depression   . Chronic headache   . COPD (chronic obstructive pulmonary disease) ([place])   . Coronary artery disease    a. s/p CABG x2 (LIMA-LAD and SVG-RCA)  b. NST neg for ischemia 07/24/14  . Diabetes mellitus    type 2  . Diverticulitis of colon   . GERD (gastroesophageal reflux disease)   . H/O aortic valve replacement   . Hearing loss   . History of blood transfusion 2013  . HOH (hard of hearing)   . Hypercholesterolemia  intolerance of statins and niaspan  . OSA (obstructive sleep apnea)    mild, intolerant of cpap  . PONV (postoperative nausea and vomiting)   . Thyroid disease     Past Surgical History:  Procedure Laterality Date  . ABDOMINAL HYSTERECTOMY    . ABDOMINAL HYSTERECTOMY W/ PARTIAL VAGINACTOMY    . AORTIC VALVE REPLACEMENT N/A 10/12/2013   Procedure: AORTIC VALVE REPLACEMENT (AVR);  Surgeon: [name], MD;  Location: [facility];  Service: Open Heart Surgery;  Laterality: N/A;  . [place]  . BARTHOLIN GLAND CYST EXCISION    . BLADDER SUSPENSION    . BREAST BIOPSY Bilateral 09/11/2000   neg  . BREAST BIOPSY Left 07/24/2010   neg  . BREAST CYST EXCISION  1988   bilateral  nonmalignant tumors, x3  . CARDIAC CATHETERIZATION    . CARDIAC CATHETERIZATION N/A 05/25/2016   Procedure: Coronary Balloon Angioplasty;  Surgeon: Leonie Man, MD;  Location: [facility];  Service: Cardiovascular;  Laterality: N/A;  . CARDIAC CATHETERIZATION N/A 05/25/2016   Procedure: Coronary/Graft Angiography;  Surgeon: Leonie Man, MD;  Location: [facility];  Service: Cardiovascular;  Laterality: N/A;  . CATARACT EXTRACTION W/ INTRAOCULAR LENS  IMPLANT, BILATERAL    . CHOLECYSTECTOMY  2001  . COLECTOMY     lap sigmoid  . COLONOSCOPY  2014   polyps found, 2 clamped off.  . CORONARY ANGIOPLASTY  10/29/2007   Prox RCA & Mid Cx.  . CORONARY ARTERY BYPASS GRAFT N/A 10/12/2013   Procedure: CORONARY ARTERY BYPASS GRAFT TIMES TWO;  Surgeon: [name], MD;  Location: [facility];  Service: Open Heart Surgery;  Laterality: N/A;  . LEFT HEART CATHETERIZATION WITH CORONARY ANGIOGRAM N/A 10/09/2013   Procedure: LEFT HEART CATHETERIZATION WITH CORONARY ANGIOGRAM;  Surgeon: [name], MD;  Location[facility];  Service: Cardiovascular;  Laterality: N/A;  . STERNAL WIRES REMOVAL N/A 04/13/2014   Procedure: STERNAL WIRES REMOVAL;  Surgeon: [name], MD;  Location: [facility];  Service: Thoracic;  Laterality: N/A;  . THYROIDECTOMY    . TONSILLECTOMY    . TUBAL LIGATION    . VAGINAL DELIVERY     3  . VISCERAL ARTERY INTERVENTION N/A 08/16/2016   Procedure: Visceral Artery Intervention;  Surgeon: Algernon Huxley, MD;  Location: [facility];  Service: Cardiovascular;  Laterality: N/A;    Social History Social History  Substance Use Topics  . Smoking status: Former Smoker    Packs/day: 0.50    Years: 30.00    Types: Cigarettes    Quit date: 10/02/2013  . Smokeless tobacco: Never Used  . Alcohol use No    Family History Family History  Problem Relation Age of Onset  . Breast cancer Mother 70  . Hypertension Father   . Mesothelioma Father   . Asthma  Father   . Stroke Paternal Grandfather   . Heart disease Unknown   . Breast cancer Maternal Aunt   . Breast cancer Paternal Aunt   No family history of bleeding/clotting disorders, porphyria or autoimmune disease   Allergies  Allergen Reactions  . Amitriptyline Other (See Comments)    Unknown reaction  . Benadryl [Diphenhydramine] Shortness Of Breath  . Demerol [Meperidine] Other (See Comments)    Unknown reaction  . Gabapentin Other (See Comments)    Unknown reaction  . Loratadine Other (See Comments)    Unknown reaction  . Meperidine Hcl Other (See Comments)  Unknown reaction  . Mirtazapine Other (See Comments)    Unknown reaction  . Olanzapine Other (See Comments)    Unknown reaction   . Voltaren [Diclofenac Sodium] Shortness Of Breath  . Zetia [Ezetimibe] Other (See Comments)    Weakness in legs, shakiness all over  . Ativan [Lorazepam] Other (See Comments)    Causes double vision at highter than .5 mg dose  . Atorvastatin Other (See Comments)    Muscle aches and weakness  . Budesonide-Formoterol Fumarate Other (See Comments)    Shakiness, tremors  . Bupropion Hcl Other (See Comments)    "cloud over me" depression  . Caffeine Other (See Comments)    jitters  . Codeine Sulfate Other (See Comments)    Makes chest hurt like a heart attack  . Lisinopril Cough  . Metformin Nausea And Vomiting  . Mometasone Furoate Nausea And Vomiting  . Morphine Sulfate Other (See Comments)    Chest pain like a heart attack  . Other Other (See Comments)    Beta Blockers, reaction shortness of breath  . Oxycodone-Acetaminophen Nausea And Vomiting  . Pioglitazone Other (See Comments)    Cannot take because of risk of bladder cancer  . Propoxyphene N-Acetaminophen Nausea And Vomiting  . Rosuvastatin Other (See Comments)    Muscle aches and weakness  . Shellfish Allergy Diarrhea  . Ticagrelor     Other reaction(s): Other (See Comments) "slowed heart rate" & chest pain  .  Tramadol Nausea Only  . Venlafaxine Other (See Comments)    Unknown reaction  . Zolpidem Tartrate Other (See Comments)     Jittery, diarrhea  . Latex Rash     REVIEW OF SYSTEMS (Negative unless checked)  Constitutional: [] Weight loss  [] Fever  [] Chills Cardiac: [] Chest pain   [] Chest pressure   [] Palpitations   [] Shortness of breath when laying flat   [] Shortness of breath with exertion. Vascular:  [] Pain in legs with walking   [] Pain in legs at rest  [] History of DVT   [] Phlebitis   [] Swelling in legs   [] Varicose veins   [] Non-healing ulcers Pulmonary:   [] Uses home oxygen   [] Productive cough   [] Hemoptysis   [] Wheeze  [] COPD   [] Asthma Neurologic:  [] Dizziness   [] Seizures   [] History of stroke   [] History of TIA  [] Aphasia   [] Vissual changes   [] Weakness or numbness in arm   [] Weakness or numbness in leg Musculoskeletal:   [] Joint swelling   [] Joint pain   [] Low back pain Hematologic:  [] Easy bruising  [] Easy bleeding   [] Hypercoagulable state   [] Anemic Gastrointestinal:  [] Diarrhea   [] Vomiting  [] Gastroesophageal reflux/heartburn   [] Difficulty swallowing. Genitourinary:  [] Chronic kidney disease   [] Difficult urination  [] Frequent urination   [] Blood in urine Skin:  [] Rashes   [] Ulcers  Psychological:  [] History of anxiety   []  History of major depression.  Physical Examination  Vitals:   12/31/16 1511  BP: (!) 156/71  Pulse: (!) 57  Resp: 16  Weight: 180 lb (81.6 kg)  Height: 5\' 6"  (1.676 m)   Body mass index is 29.05 kg/m. Gen: WD/WN, NAD Head: [place]/AT, No temporalis wasting.  Ear/Nose/Throat: Hearing grossly intact, nares w/o erythema or drainage, poor dentition Eyes: PER, EOMI, sclera nonicteric.  Neck: Supple, no masses.  No bruit or JVD.  Pulmonary:  Good air movement, clear to auscultation bilaterally, no use of accessory muscles.  Cardiac: RRR, normal S1, S2, no Murmurs. Vascular:  Vessel Right Left  Radial Palpable Palpable  Ulnar Palpable Palpable    Brachial Palpable Palpable  Carotid Palpable Palpable  Femoral Palpable Palpable  Popliteal Palpable Palpable  PT Palpable Palpable  DP Palpable Palpable   Gastrointestinal: soft, non-distended. No guarding/no peritoneal signs.  Musculoskeletal: M/S 5/5 throughout.  No deformity or atrophy.  Neurologic: CN 2-12 intact. Pain and light touch intact in extremities.  Symmetrical.  Speech is fluent. Motor exam as listed above. Psychiatric: Judgment intact, Mood & affect appropriate for pt's clinical situation. Dermatologic: No rashes or ulcers noted.  No changes consistent with cellulitis. Lymph : No Cervical lymphadenopathy, no lichenification or skin changes of chronic lymphedema.  CBC Lab Results  Component Value Date   WBC 8.5 11/25/2016   HGB 10.7 (L) 11/25/2016   HCT 34.4 (L) 11/25/2016   MCV 77.0 (L) 11/25/2016   PLT 180 11/25/2016    BMET    Component Value Date/Time   NA 137 11/25/2016 1955   NA 138 06/07/2016 0957   NA 137 12/22/2013 1544   K 4.7 11/25/2016 1955   K 4.0 12/22/2013 1544   CL 105 11/25/2016 1955   CL 106 12/22/2013 1544   CO2 23 11/25/2016 1955   CO2 24 12/22/2013 1544   GLUCOSE 137 (H) 11/25/2016 1955   GLUCOSE 184 (H) 12/22/2013 1544   BUN 18 11/25/2016 1955   BUN 15 06/07/2016 0957   BUN 15 12/22/2013 1544   CREATININE 1.12 (H) 11/25/2016 1955   CREATININE 0.90 12/22/2013 1544   CREATININE 0.67 08/30/2011 1120   CALCIUM 8.4 (L) 11/25/2016 1955   CALCIUM 7.9 (L) 12/22/2013 1544   GFRNONAA 48 (L) 11/25/2016 1955   GFRNONAA >60 12/22/2013 1544   GFRAA 55 (L) 11/25/2016 1955   GFRAA >60 12/22/2013 1544   CrCl cannot be calculated (Patient's most recent lab result is older than the maximum 21 days allowed.).  COAG Lab Results  Component Value Date   INR 1.24 08/19/2016   INR 1.13 08/16/2016   INR 1.12 05/25/2016    Radiology Mr Jodene Nam Abdomen W Wo Contrast  Result Date: 12/04/2016 CLINICAL DATA:  71 year old female with a history of left  lower quadrant abdominal pain for 1 week. History of diverticulitis. EXAM: MRI ABDOMEN WITHOUT AND WITH CONTRAST TECHNIQUE: Multiplanar multisequence MR imaging of the abdomen was performed both before and after the administration of intravenous contrast. CONTRAST:  88mL MULTIHANCE GADOBENATE DIMEGLUMINE 529 MG/ML IV SOLN COMPARISON:  CT 11/25/2016 FINDINGS: Nonvascular: Lower chest: Visualized aspects of the lower mediastinum and the lower lungs unremarkable. Greatest cranial caudal span of the spleen measures 13.5 cm on coronal images. Uniform signal of liver parenchyma. Surgical changes of cholecystectomy. Unremarkable signal of the bilateral kidneys with no hydronephrosis. Unremarkable adrenal glands. Similar appearance of hiatal hernia. No abnormally distended small bowel or colon. Re- demonstration of diverticular a, particularly within the descending colon and sigmoid colon. Soft tissue/fluid adjacent to the sigmoid colon within the fat, compatible with inflammatory changes and relatively unchanged compared to the prior CT of 11/25/2016. No focal fluid collection, although the sensitivity of MR is decreased compared to that of CT. The current MR does not images the pelvis. Unremarkable appearance of the visualized musculoskeletal structures. Vascular: Irregular contour of the lumen of the lower thoracic and abdominal aorta, compatible with atherosclerotic changes. The overall diameter in configuration of the aorta is unchanged from the prior CT. Irregular plaque of the infrarenal abdominal aorta, just below the lowest right renal artery with evidence of chronic short-segment dissection flap, non flow limiting. Greatest  diameter measured AP dimension just below the right renal artery approximately 2.7 cm, unchanged from prior. No periaortic fluid. Origin of celiac artery patent. Origin of the superior mesenteric artery patent. Origin of the inferior mesenteric artery patent. Bilateral renal arteries patent,  with irregularity along the proximal third compatible with atherosclerotic changes. Bilateral common iliac arteries and external iliac arteries are patent with no aneurysm or occlusion. Irregularity of the lumen compatible with atherosclerotic changes, present on the comparison CT study. IMPRESSION: Abdomen MRA demonstrates ongoing inflammatory changes adjacent to the descending colon and at the proximal sigmoid colon, compatible with diverticulitis, which was present on the CT dated 11/25/2016. If a more sensitive and specific imaging evaluation of the known diverticulitis is warranted, consider CT imaging. Aortic disease, including aortic atherosclerosis and infrarenal abdominal aortic ectasia measuring as large as 2.7 cm. As was previously noted, follow-up recommended by ultrasound in 5 years. This recommendation follows ACR consensus guidelines: White Paper of the ACR Incidental Findings Committee II on Vascular Findings. J Am Coll Radiol 2013; 10:789-794. Aortic Atherosclerosis (ICD10-I70.0). Atherosclerotic changes involve the bilateral renal arteries and mesenteric arteries, with patency maintained. Atherosclerosis of the visualized bilateral iliac arteries, which remain patent. Signed, Dulcy Fanny. Earleen Newport, DO [facility] Radiology Electronically Signed   By: Corrie Mckusick D.O.   On: 12/04/2016 16:51    Assessment/Plan 1. Thoracoabdominal aortic aneurysm (TAAA) without rupture (HCC) No surgery or intervention at this time. The patient has an asymptomatic abdominal aortic aneurysm that is less than 4 cm in maximal diameter.  I have discussed the natural history of abdominal aortic aneurysm and the small risk of rupture for aneurysm less than 5 cm in size.  However, as these small aneurysms tend to enlarge over time, continued surveillance with ultrasound or CT scan is mandatory.  I have also discussed optimizing medical management with hypertension and lipid  control and the importance of abstinence from tobacco.  The patient is also encouraged to exercise a minimum of 30 minutes 4 times a week.  Should the patient develop new onset abdominal or back pain or signs of peripheral embolization they are instructed to seek medical attention immediately and to alert the physician providing care that they have an aneurysm.  The patient voices their understanding. The patient will return in 12 months with an aortic duplex.  - CT ANGIO ABDOMEN W &/OR WO CONTRAST; Future  2. Essential hypertension Continue antihypertensive medications as already ordered, these medications have been reviewed and there are no changes at this time.   3. Coronary artery disease without angina pectoris, unspecified vessel or lesion type, unspecified whether native or transplanted heart Continue cardiac and antihypertensive medications as already ordered and reviewed, no changes at this time.  Continue statin as ordered and reviewed, no changes at this time  Nitrates PRN for chest pain   4. Insulin dependent diabetes mellitus ([place]) Continue hypoglycemic medications as already ordered, these medications have been reviewed and there are no changes at this time.  Hgb A1C to be monitored as already arranged by primary service   5. Dyslipidemia-statin ontol Continue statin as ordered and reviewed, no changes at this time    Hortencia Pilar, MD  12/31/2016 3:24 PM

## 2017-01-01 DIAGNOSIS — I77811 Abdominal aortic ectasia: Secondary | ICD-10-CM | POA: Insufficient documentation

## 2017-01-02 DIAGNOSIS — I716 Thoracoabdominal aortic aneurysm, without rupture, unspecified: Secondary | ICD-10-CM | POA: Insufficient documentation

## 2017-01-23 ENCOUNTER — Ambulatory Visit (INDEPENDENT_AMBULATORY_CARE_PROVIDER_SITE_OTHER): Payer: Medicare Other | Admitting: Family Medicine

## 2017-01-23 ENCOUNTER — Encounter: Payer: Self-pay | Admitting: Family Medicine

## 2017-01-23 VITALS — BP 120/70 | HR 64 | Temp 98.1°F | Ht 66.0 in | Wt 182.0 lb

## 2017-01-23 DIAGNOSIS — M25511 Pain in right shoulder: Secondary | ICD-10-CM | POA: Diagnosis not present

## 2017-01-23 DIAGNOSIS — R5383 Other fatigue: Secondary | ICD-10-CM | POA: Diagnosis not present

## 2017-01-23 DIAGNOSIS — M19011 Primary osteoarthritis, right shoulder: Secondary | ICD-10-CM

## 2017-01-23 DIAGNOSIS — D508 Other iron deficiency anemias: Secondary | ICD-10-CM | POA: Diagnosis not present

## 2017-01-23 DIAGNOSIS — M19012 Primary osteoarthritis, left shoulder: Secondary | ICD-10-CM

## 2017-01-23 DIAGNOSIS — M25512 Pain in left shoulder: Secondary | ICD-10-CM

## 2017-01-23 DIAGNOSIS — G8929 Other chronic pain: Secondary | ICD-10-CM | POA: Diagnosis not present

## 2017-01-23 LAB — CBC WITH DIFFERENTIAL/PLATELET
BASOS ABS: 0.1 10*3/uL (ref 0.0–0.1)
Basophils Relative: 0.9 % (ref 0.0–3.0)
EOS ABS: 0.3 10*3/uL (ref 0.0–0.7)
Eosinophils Relative: 3.4 % (ref 0.0–5.0)
HCT: 36.2 % (ref 36.0–46.0)
Hemoglobin: 11.5 g/dL — ABNORMAL LOW (ref 12.0–15.0)
LYMPHS ABS: 1.8 10*3/uL (ref 0.7–4.0)
LYMPHS PCT: 23.4 % (ref 12.0–46.0)
MCHC: 31.8 g/dL (ref 30.0–36.0)
MCV: 76.6 fl — AB (ref 78.0–100.0)
MONOS PCT: 7.3 % (ref 3.0–12.0)
Monocytes Absolute: 0.5 10*3/uL (ref 0.1–1.0)
NEUTROS PCT: 65 % (ref 43.0–77.0)
Neutro Abs: 4.9 10*3/uL (ref 1.4–7.7)
Platelets: 182 10*3/uL (ref 150.0–400.0)
RBC: 4.72 Mil/uL (ref 3.87–5.11)
RDW: 18.2 % — ABNORMAL HIGH (ref 11.5–15.5)
WBC: 7.5 10*3/uL (ref 4.0–10.5)

## 2017-01-23 LAB — IBC PANEL
IRON: 28 ug/dL — AB (ref 42–145)
SATURATION RATIOS: 8 % — AB (ref 20.0–50.0)
TRANSFERRIN: 250 mg/dL (ref 212.0–360.0)

## 2017-01-23 MED ORDER — METHYLPREDNISOLONE ACETATE 40 MG/ML IJ SUSP
80.0000 mg | Freq: Once | INTRAMUSCULAR | Status: AC
  Administered 2017-01-23: 80 mg via INTRA_ARTICULAR

## 2017-01-23 NOTE — Progress Notes (Signed)
Dr. Frederico Hamman T. Reyan Helle, MD, [facility] [place], [address] Phone: [phone] Fax: [phone]  01/23/2017  Patient: [name], MRN: [id number], DOB: 1945/10/05, 71 y.o.  Primary Physician:  Glendon Axe, MD   Chief Complaint  Patient presents with  . Shoulder Pain    Bilateral-Wants injections   Subjective:   [name] is a 71 y.o. very pleasant female patient who presents with the following:  The patient is known for many years.  She primarily is here in follow-up for bilateral continual shoulder pain.  She is had some frozen shoulder in years past and she also has some degenerative changes in her shoulders.  She has a crepitus with motion with both shoulders.  She has pain deep in the shoulder as well as at the a.c. Joint.  No trauma or injury.  I did do a shoulder injection for her about one year ago and she had a excellent response from this.  She also tells me that she is generally tired and she thinks that she is likely anemic.  She is on some antiplatelet medication also.  Her primary care provider is at the [facility].  History is also significant for approximately 30 listed allergies  B shoulder pain.  Past Medical History, Surgical History, Social History, Family History, Problem List, Medications, and Allergies have been reviewed and updated if relevant.  Patient Active Problem List   Diagnosis Date Noted  . 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 Acute posthemorrhagic anemia 08/24/2016  . Acute respiratory failure with hypoxia ([place]) 08/24/2016  . Acute diastolic CHF (congestive heart failure) ([place]) 08/24/2016  . Hyponatremia 08/24/2016  . Leukocytosis 08/24/2016  . Fever 08/24/2016  . Diarrhea 08/24/2016  . Generalized weakness 08/24/2016  . Acute diverticulitis 08/16/2016  . Lower GI bleed   . NSTEMI (non-ST elevated myocardial infarction) ([place])   . AVB (atrioventricular  block)   . Atypical chest pain   . Unstable angina ([place]) 05/25/2016  . Palpitations 05/25/2016  . PUD - recent Rx for H.Pylori 05/25/2016  . Stenosis of coronary artery stent   . Recurrent major depressive disorder, in partial remission ([place]) 07/29/2015  . Parotiditis 04/25/2015  . Viral URI 03/24/2015  . Hypoglycemia 08/25/2014  . Nausea without vomiting 08/25/2014  . Dyslipidemia-statin ontol   . Long-term insulin use ([place]) 03/31/2014  . Aortic valve disorder 11/11/2013  . 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 Coronary Artery Disease s/p CABG 10/2013 10/10/2013  . Other malaise and fatigue 09/11/2013  . Alopecia 03/06/2013  . Insomnia 02/06/2013  . Gastroesophageal reflux disease without esophagitis 01/19/2013  . Dysphagia, unspecified(787.20) 01/19/2013  . Dyspnea 11/17/2012  . Weakness 11/17/2012  . Abnormal MRI of head 09/05/2012  . Depression with anxiety 03/21/2012  . Extrinsic asthma 11/12/2011  . Anemia 06/07/2011  . Chronic obstructive pulmonary disease ([place]) 01/25/2010  . Acquired hypothyroidism 08/10/2009  . MICROSCOPIC HEMATURIA 05/07/2009  . GOITER 03/02/2009  . Essential hypertension 01/06/2009  . MEMORY LOSS 06/23/2008  . Obstructive sleep apnea 12/25/2007  . Insulin dependent diabetes mellitus ([place]) 09/09/2006  . ALLERGIC RHINITIS 09/09/2006  . DIVERTICULOSIS, COLON 09/09/2006    Past Medical History:  Diagnosis Date  . ACE-inhibitor cough   . Allergic rhinitis   . Anemia    iron deficiency anemia  . Anxiety   . Arthritis   . Asthma   . Cataract   . Chronic depression   . Chronic headache   .  COPD (chronic obstructive pulmonary disease) ([place])   . Coronary artery disease    a. s/p CABG x2 (LIMA-LAD and SVG-RCA)  b. NST neg for ischemia 07/24/14  . Diabetes mellitus    type 2  . Diverticulitis of colon   . GERD (gastroesophageal reflux disease)   . H/O aortic valve replacement   . Hearing loss   . History of blood transfusion 2013  . HOH (hard of  hearing)   . Hypercholesterolemia    intolerance of statins and niaspan  . OSA (obstructive sleep apnea)    mild, intolerant of cpap  . PONV (postoperative nausea and vomiting)   . Thyroid disease     Past Surgical History:  Procedure Laterality Date  . ABDOMINAL HYSTERECTOMY    . ABDOMINAL HYSTERECTOMY W/ PARTIAL VAGINACTOMY    . AORTIC VALVE REPLACEMENT N/A 10/12/2013   Procedure: AORTIC VALVE REPLACEMENT (AVR);  Surgeon: [name], MD;  Location: [facility];  Service: Open Heart Surgery;  Laterality: N/A;  . [place]  . BARTHOLIN GLAND CYST EXCISION    . BLADDER SUSPENSION    . BREAST BIOPSY Bilateral 09/11/2000   neg  . BREAST BIOPSY Left 07/24/2010   neg  . BREAST CYST EXCISION  1988   bilateral nonmalignant tumors, x3  . CARDIAC CATHETERIZATION    . CARDIAC CATHETERIZATION N/A 05/25/2016   Procedure: Coronary Balloon Angioplasty;  Surgeon: Leonie Man, MD;  Location: [facility];  Service: Cardiovascular;  Laterality: N/A;  . CARDIAC CATHETERIZATION N/A 05/25/2016   Procedure: Coronary/Graft Angiography;  Surgeon: Leonie Man, MD;  Location: [facility];  Service: Cardiovascular;  Laterality: N/A;  . CATARACT EXTRACTION W/ INTRAOCULAR LENS  IMPLANT, BILATERAL    . CHOLECYSTECTOMY  2001  . COLECTOMY     lap sigmoid  . COLONOSCOPY  2014   polyps found, 2 clamped off.  . CORONARY ANGIOPLASTY  10/29/2007   Prox RCA & Mid Cx.  . CORONARY ARTERY BYPASS GRAFT N/A 10/12/2013   Procedure: CORONARY ARTERY BYPASS GRAFT TIMES TWO;  Surgeon: [name], MD;  Location: [facility];  Service: Open Heart Surgery;  Laterality: N/A;  . LEFT HEART CATHETERIZATION WITH CORONARY ANGIOGRAM N/A 10/09/2013   Procedure: LEFT HEART CATHETERIZATION WITH CORONARY ANGIOGRAM;  Surgeon: [name], MD;  Location: [facility];  Service: Cardiovascular;  Laterality: N/A;  . STERNAL WIRES REMOVAL N/A 04/13/2014   Procedure: STERNAL WIRES REMOVAL;  Surgeon: [name], MD;  Location: [facility];  Service: Thoracic;  Laterality: N/A;  . THYROIDECTOMY    . TONSILLECTOMY    . TUBAL LIGATION    . VAGINAL DELIVERY     3  . VISCERAL ARTERY INTERVENTION N/A 08/16/2016   Procedure: Visceral Artery Intervention;  Surgeon: Algernon Huxley, MD;  Location: [facility];  Service: Cardiovascular;  Laterality: N/A;    Social History   Social History  . Marital status: Married    Spouse name: N/A  . Number of children: 3  . Years of education: N/A   Occupational History  . 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 Smoking status: Former Smoker    Packs/day: 0.50    Years: 30.00    Types: Cigarettes    Quit date: 10/02/2013  . Smokeless tobacco: Never Used  . Alcohol use No  . Drug use: No  . Sexual activity: Not on file   Other Topics Concern  .  Not on file   Social History Narrative   Does not have Living Will   Desires CPR, would not want prolonged life support if futile.    Family History  Problem Relation Age of Onset  . Breast cancer Mother 33  . Hypertension Father   . Mesothelioma Father   . Asthma Father   . Stroke Paternal Grandfather   . Heart disease Unknown   . Breast cancer Maternal Aunt   . Breast cancer Paternal Aunt     Allergies  Allergen Reactions  . Amitriptyline Other (See Comments)    Unknown reaction  . Benadryl [Diphenhydramine] Shortness Of Breath  . Demerol [Meperidine] Other (See Comments)    Unknown reaction  . Gabapentin Other (See Comments)    Unknown reaction  . Loratadine Other (See Comments)    Unknown reaction  . Meperidine Hcl Other (See Comments)    Unknown reaction  . Mirtazapine Other (See Comments)    Unknown reaction  . Olanzapine Other (See Comments)    Unknown reaction   . Voltaren [Diclofenac Sodium] Shortness Of Breath  . Zetia [Ezetimibe] Other (See Comments)    Weakness in legs, shakiness all over  . Ativan [Lorazepam] Other (See Comments)    Causes double  vision at highter than .5 mg dose  . Atorvastatin Other (See Comments)    Muscle aches and weakness  . Budesonide-Formoterol Fumarate Other (See Comments)    Shakiness, tremors  . Bupropion Hcl Other (See Comments)    "cloud over me" depression  . Caffeine Other (See Comments)    jitters  . Codeine Sulfate Other (See Comments)    Makes chest hurt like a heart attack  . Lisinopril Cough  . Metformin Nausea And Vomiting  . Mometasone Furoate Nausea And Vomiting  . Morphine Sulfate Other (See Comments)    Chest pain like a heart attack  . Other Other (See Comments)    Beta Blockers, reaction shortness of breath  . Oxycodone-Acetaminophen Nausea And Vomiting  . Pioglitazone Other (See Comments)    Cannot take because of risk of bladder cancer  . Propoxyphene N-Acetaminophen Nausea And Vomiting  . Rosuvastatin Other (See Comments)    Muscle aches and weakness  . Shellfish Allergy Diarrhea  . Ticagrelor     Other reaction(s): Other (See Comments) "slowed heart rate" & chest pain  . Tramadol Nausea Only  . Venlafaxine Other (See Comments)    Unknown reaction  . Zolpidem Tartrate Other (See Comments)     Jittery, diarrhea  . Latex Rash    Medication list reviewed and updated in full in [place].  GEN: No fevers, chills. Nontoxic. Primarily MSK c/o today. MSK: Detailed in the HPI GI: tolerating PO intake without difficulty Neuro: No numbness, parasthesias, or tingling associated. Otherwise the pertinent positives of the ROS are noted above.   Objective:   BP 120/70   Pulse 64   Temp 98.1 F (36.7 C) (Oral)   Ht 5\' 6"  (1.676 m)   Wt 182 lb (82.6 kg)   BMI 29.38 kg/m    GEN: WDWN, NAD, Non-toxic, Alert & Oriented x 3 HEENT: Atraumatic, Normocephalic.  Ears and Nose: No external deformity. EXTR: No clubbing/cyanosis/edema NEURO: Normal gait.  PSYCH: Normally interactive. Conversant. Not depressed or anxious appearing.  Calm demeanor.   Shoulder:  B Inspection: No muscle wasting or winging Ecchymosis/edema: neg  AC joint, scapula, clavicle: mild TTP Cervical spine: NT, full ROM Spurling's: neg Abduction: full, 4++/5 Flexion: full, 5/5  IR, full, lift-off: 5/5 ER at neutral: full, 5/5 AC crossover and compression: neg Neer: pain Hawkins: pain Drop Test: neg Empty Can: neg Supraspinatus insertion: NT Bicipital groove: NT Speed's: mild pos Yergason's: neg Sulcus sign: neg Scapular dyskinesis: none C5-T1 intact Sensation intact Grip 5/5   Radiology: No results found.  Assessment and Plan:   Chronic pain of both shoulders - Plan: methylPREDNISolone acetate (DEPO-MEDROL) injection 80 mg, methylPREDNISolone acetate (DEPO-MEDROL) injection 80 mg  Other iron deficiency anemia - Plan: CBC with Differential/Platelet, Ferritin, IBC panel  Arthritis of both shoulder regions  Other fatigue  Arthritis and chronic pain.  It is reasonable to do an intra-articular injection from a pain management standpoint.  Patient had almost a year's relief of symptoms after last injection.  Fatigue of unclear origin, possible anemia per patient's history and history of iron deficiency anemia.  I think it is reasonable to check this at her request, and if abnormal, we will involve her primary care provider.  Intrarticular Shoulder Injection, R Verbal consent was obtained from the patient. Risks including infection explained and contrasted with benefits and alternatives. Patient prepped with Chloraprep and Ethyl Chloride used for anesthesia. An intraarticular shoulder injection was performed using the posterior approach. The patient tolerated the procedure well and had decreased pain post injection. No complications. Injection: 8 cc of Lidocaine 1% and 2 mL Depo-Medrol 40 mg. Needle: 22 gauge  Intrarticular Shoulder Injection, L Verbal consent was obtained from the patient. Risks including infection explained and contrasted with benefits and  alternatives. Patient prepped with Chloraprep and Ethyl Chloride used for anesthesia. An intraarticular shoulder injection was performed using the posterior approach. The patient tolerated the procedure well and had decreased pain post injection. No complications. Injection: 8 cc of Lidocaine 1% and 2 mL Depo-Medrol 40 mg. Needle: 22 gauge   Follow-up: No Follow-up on file.  Meds ordered this encounter  Medications  . methylPREDNISolone acetate (DEPO-MEDROL) injection 80 mg  . methylPREDNISolone acetate (DEPO-MEDROL) injection 80 mg   Orders Placed This Encounter  Procedures  . CBC with Differential/Platelet  . Ferritin  . IBC panel    Signed,  Frederico Hamman T. Zoeie Ritter, MD   Patient's Medications  New Prescriptions   No medications on file  Previous Medications   ALBUTEROL (PROAIR HFA) 108 (90 BASE) MCG/ACT INHALER    Inhale 2 puffs into the lungs every 4 (four) hours as needed for wheezing or shortness of breath.   CALCIUM-VITAMIN D (CALTRATE 600 PLUS-VIT D PO)    Take 2 tablets by mouth 2 (two) times daily.    ESOMEPRAZOLE (NEXIUM) 40 MG PACKET    Take by mouth.   FLUOXETINE (PROZAC) 40 MG CAPSULE    Take 40 mg by mouth 2 (two) times daily.   INSULIN NPH-REGULAR HUMAN (NOVOLIN 70/30) (70-30) 100 UNIT/ML INJECTION    Inject 70 Units into the skin 2 (two) times daily.   LEVOTHYROXINE (SYNTHROID, LEVOTHROID) 150 MCG TABLET    Take 150 mcg by mouth daily before breakfast.   NITROGLYCERIN (NITROSTAT) 0.4 MG SL TABLET    Place 0.4 mg under the tongue every 5 (five) minutes as needed for chest pain.   ONDANSETRON (ZOFRAN) 4 MG TABLET    Take 1 tablet (4 mg total) by mouth every 6 (six) hours as needed for nausea.   ONETOUCH DELICA LANCETS 76B MISC    USE TO CHECK BLOOD SUGAR THREE TIMES DAILY   TRAZODONE (DESYREL) 100 MG TABLET    TAKE TWO TABLETS BY MOUTH  AT BEDTIME  Modified Medications   No medications on file  Discontinued Medications   No medications on file

## 2017-01-24 ENCOUNTER — Other Ambulatory Visit: Payer: Self-pay | Admitting: Student

## 2017-01-24 DIAGNOSIS — R1312 Dysphagia, oropharyngeal phase: Secondary | ICD-10-CM

## 2017-01-24 LAB — FERRITIN: Ferritin: 34.8 ng/mL (ref 10.0–291.0)

## 2017-01-25 ENCOUNTER — Encounter: Payer: Self-pay | Admitting: Physician Assistant

## 2017-01-25 NOTE — Progress Notes (Addendum)
Cardiology Office Note    Date:  01/28/2017  ID:  [name], [name] Feb 10, 1946, MRN [id number] PCP:  Glendon Axe, MD  Cardiologist: Dr. Angelena Form   Chief Complaint: f/u CAD, CHF  History of Present Illness:  [name] is a 71 y.o. female with history of anemia, HTN, HLD, DM, COPD, CAD (DES to RCA and mid Cx 2009, followed by CABG and bioprosthetic AVR May 2015, cutting balloon to prox Cx in 05/2016), 30+ medication allergies, probable chronic diastolic CHF, diverticulitis/GIB 08/2016, 1st degree and 2nd dgree AV block, ectatic aorta, probable PAD by imaging, statin intolerance who is here today for routine follow-up.  She had a DES placed in the RCA and mid Circumflex in May 2009. She was admitted 10/2013 at [facility] with chest pain. Echo demonstrated normal LVEF, mod AI and mild AS. Myoview was negative for ischemia. She continued to have symptoms thus was set up for definitive cath showing 3v CAD with severe prox LAD stenosis and ostial RCA. She was referred for CABG. Intraoperative TEE demonstrated mild AS and mod to severe AI. She underwent CABG with Dr. Cyndia Bent 10/12/13 (L-LAD, S-RCA) + AVR with a bioprosthetic valve (21 mm [employer]). She was seen in our office 12/03/13 by Richardson Dopp, PA-C and still having chest pain. Troponin was negative but D-dimer was elevated. She was sent to [facility] for CTA chest. There was no PE on chest CT but noted to have small left pleural effusion. Since that time she has had evaluations for recurrent chest pain, with negative stress testing in 07/2014 but abnormal cath in 05/2016 showing patent LIMA to LAD and SVG to RCA but progression of stent restenosis in the proximal circumflex which was treated with cutting balloon angioplasty. She did not tolerate Brilinta and was placed on Effient. 2D echo at that time showed EF 60-65%, grade 1 DD, high ventricular filling pressures, unchanged AVR gradients, moderately dilated LA, mild RAE, mild TR, PASP 35mmHg. She had  second degree AV block noted in the hospital. She had continued dizziness post discharge. Cardiac monitor showed NSR with 1st degree AVB, type 1 second degree AVB, occasional junctional rhythm, rare PACs without diary notation of symptoms during these events. She was seen by Dr. Caryl Comes who did not feel that her bradycardia and heart block was related to her dizziness. He suggested that her dyspnea may be related to chronotropic incompetence and suggested stress testing. Exercise stress test on 07/27/16 with 1 minute 36 seconds of exercise stopped due to dizziness but HR responded normally to exercise. In 2018 had some issues with diverticulitis, with admission in 08/2016 for ABL anemia. She underwent abd angiogram with embolization. During that admission she also developed acute repspiratory failure felt due to acute diastolic heart failure requiring diuresis. Last labs from June-August 2018 showed Hgb 11.5, Plt 182, K 4.7, Cr 1.12 (higher than prior), albumin 3.1. CT abd in 12/2016 incidentally noted aortic atherosclerosis and infrarenal abdominal aortic ectasia measuring as large as 2.7 cm with recommendation to repeat US in 2023. There were also atherosclerotic changes involve the bilateral renal arteries and mesenteric arteries, as well as bilateral iliac arteries, with patency maintained. Referral to lipid clinic has been made in the past but patient was not interested in PCSK-9.  She presents back to clinic today overall doing stable from a heart standpoint. No interim chest pain or angina. She has chronic mild DOE but is extremely sedentary due to multiple other medical issues and arthritis. She denies  any dizziness, syncope, LEE, orthopnea or any recurrent bleeding. She states she's back on ASA and Effient both without difficulty. Dizziness resolved with discontinuation of supplement called "Mid-nite."   Past Medical History:  Diagnosis Date  . 1st degree AV block   . ACE-inhibitor cough   . Allergic  rhinitis   . Anemia    iron deficiency anemia  . Anxiety   . Aortic ectasia (HCC)    a. CT abd in 12/2016 incidentally noted aortic atherosclerosis and infrarenal abdominal aortic ectasia measuring as large as 2.7 cm with recommendation to repeat US in 2023.  Marland Kitchen Arthritis   . Asthma   . Cataract   . Chronic depression   . Chronic diastolic CHF (congestive heart failure) ([place])   . Chronic headache   . COPD (chronic obstructive pulmonary disease) ([place])   . Coronary artery disease    a. DES to RCA and mid Cx 2009. b. CABG and bioprosthetic AVR May 2015. c. cutting balloon to prox Cx in 05/2016  . Diabetes mellitus    type 2  . Diverticulitis of colon   . Essential hypertension   . GERD (gastroesophageal reflux disease)   . Hearing loss   . History of blood transfusion 2013  . History of prosthetic aortic valve replacement   . HOH (hard of hearing)   . Hypercholesterolemia    intolerance of statins and niaspan  . Mobitz type 1 second degree AV block   . OSA (obstructive sleep apnea)    mild, intolerant of cpap  . PAD (peripheral artery disease) ([place])    a. atherosclerosis by CT abd 12/2016 in [place].  Marland Kitchen PONV (postoperative nausea and vomiting)   . Statin intolerance   . Thyroid disease     Past Surgical History:  Procedure Laterality Date  . ABDOMINAL HYSTERECTOMY    . ABDOMINAL HYSTERECTOMY W/ PARTIAL VAGINACTOMY    . AORTIC VALVE REPLACEMENT N/A 10/12/2013   Procedure: AORTIC VALVE REPLACEMENT (AVR);  Surgeon: [name], MD;  Location: [facility];  Service: Open Heart Surgery;  Laterality: N/A;  . [place]  . BARTHOLIN GLAND CYST EXCISION    . BLADDER SUSPENSION    . BREAST BIOPSY Bilateral 09/11/2000   neg  . BREAST BIOPSY Left 07/24/2010   neg  . BREAST CYST EXCISION  1988   bilateral nonmalignant tumors, x3  . CARDIAC CATHETERIZATION    . CARDIAC CATHETERIZATION N/A 05/25/2016   Procedure: Coronary Balloon Angioplasty;  Surgeon: Leonie Man, MD;  Location: [facility];  Service: Cardiovascular;  Laterality: N/A;  . CARDIAC CATHETERIZATION N/A 05/25/2016   Procedure: Coronary/Graft Angiography;  Surgeon: Leonie Man, MD;  Location: [facility];  Service: Cardiovascular;  Laterality: N/A;  . CATARACT EXTRACTION W/ INTRAOCULAR LENS  IMPLANT, BILATERAL    . CHOLECYSTECTOMY  2001  . COLECTOMY     lap sigmoid  . COLONOSCOPY  2014   polyps found, 2 clamped off.  . CORONARY ANGIOPLASTY  10/29/2007   Prox RCA & Mid Cx.  . CORONARY ARTERY BYPASS GRAFT N/A 10/12/2013   Procedure: CORONARY ARTERY BYPASS GRAFT TIMES TWO;  Surgeon: [name], MD;  Location: [facility];  Service: Open Heart Surgery;  Laterality: N/A;  . LEFT HEART CATHETERIZATION WITH CORONARY ANGIOGRAM N/A 10/09/2013   Procedure: LEFT HEART CATHETERIZATION WITH CORONARY ANGIOGRAM;  Surgeon: [name], MD;  Location: [facility] LAB;  Service: Cardiovascular;  Laterality: N/A;  . STERNAL WIRES REMOVAL N/A  04/13/2014   Procedure: STERNAL WIRES REMOVAL;  Surgeon: [name], MD;  Location: [facility] OR;  Service: Thoracic;  Laterality: N/A;  . THYROIDECTOMY    . TONSILLECTOMY    . TUBAL LIGATION    . VAGINAL DELIVERY     3  . VISCERAL ARTERY INTERVENTION N/A 08/16/2016   Procedure: Visceral Artery Intervention;  Surgeon: Algernon Huxley, MD;  Location: [place];  Service: Cardiovascular;  Laterality: N/A;    Current Medications:   She affirms she is taking aspirin 81mg  daily still, nurse is adding to med list Current Meds  Medication Sig  . albuterol (PROAIR HFA) 108 (90 Base) MCG/ACT inhaler Inhale 2 puffs into the lungs every 4 (four) hours as needed for wheezing or shortness of breath.  . Calcium-Vitamin D (CALTRATE 600 PLUS-VIT D PO) Take 2 tablets by mouth 2 (two) times daily.   Marland Kitchen FLUoxetine (PROZAC) 40 MG capsule Take 40 mg by mouth 2 (two) times daily.  . insulin NPH-regular Human (NOVOLIN 70/30) (70-30) 100 UNIT/ML injection Inject 70 Units into the  skin 2 (two) times daily.  Marland Kitchen levothyroxine (SYNTHROID, LEVOTHROID) 150 MCG tablet Take 150 mcg by mouth daily before breakfast.  . nitroGLYCERIN (NITROSTAT) 0.4 MG SL tablet Place 0.4 mg under the tongue every 5 (five) minutes as needed for chest pain.  Marland Kitchen ondansetron (ZOFRAN) 4 MG tablet Take 1 tablet (4 mg total) by mouth every 6 (six) hours as needed for nausea.  [name] DELICA LANCETS 38H MISC USE TO CHECK BLOOD SUGAR THREE TIMES DAILY  . pantoprazole (PROTONIX) 20 MG tablet Take 20 mg by mouth daily.  . prasugrel (EFFIENT) 10 MG TABS tablet Take 10 mg by mouth daily.  . traZODone (DESYREL) 100 MG tablet TAKE TWO TABLETS BY MOUTH AT BEDTIME  . [DISCONTINUED] esomeprazole (NEXIUM) 40 MG packet Take by mouth.     Allergies:   Amitriptyline; Benadryl [diphenhydramine]; Demerol [meperidine]; Gabapentin; Loratadine; Meperidine hcl; Mirtazapine; Olanzapine; Voltaren [diclofenac sodium]; Zetia [ezetimibe]; Ativan [lorazepam]; Atorvastatin; Budesonide-formoterol fumarate; Bupropion hcl; Caffeine; Codeine sulfate; Lisinopril; Metformin; Mometasone furoate; Morphine sulfate; Other; Oxycodone-acetaminophen; Pioglitazone; Propoxyphene n-acetaminophen; Rosuvastatin; Shellfish allergy; Ticagrelor; Tramadol; Venlafaxine; Zolpidem tartrate; and Latex   Social History   Social History  . Marital status: Married    Spouse name: N/A  . Number of children: 3  . Years of education: N/A   Occupational History  . 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 Smoking status: Former Smoker    Packs/day: 0.50    Years: 30.00    Types: Cigarettes    Quit date: 10/02/2013  . Smokeless tobacco: Never Used  . Alcohol use No  . Drug use: No  . Sexual activity: Not Asked   Other Topics Concern  . None   Social History Narrative   Does not have Living Will   Desires CPR, would not want prolonged life support if futile.     Family History:  Family History  Problem Relation Age of Onset    . Breast cancer Mother 68  . Hypertension Father   . Mesothelioma Father   . Asthma Father   . Stroke Paternal Grandfather   . Heart disease Unknown   . Breast cancer Maternal Aunt   . Breast cancer Paternal Aunt     ROS:   Please see the history of present illness.  All other systems are reviewed and otherwise negative.    PHYSICAL EXAM:   VS:  BP Marland Kitchen)  142/70   Pulse 65   Ht 5\' 6"  (1.676 m)   Wt 179 lb 12.8 oz (81.6 kg)   SpO2 95%   BMI 29.02 kg/m   BMI: Body mass index is 29.02 kg/m. GEN: Well nourished, well developed overweight WF, in no acute distress  HEENT: normocephalic, atraumatic Neck: no JVD, carotid bruits, or masses Cardiac: RRR; soft SEM, no rubs or gallops, no edema  Respiratory:  clear to auscultation bilaterally, normal work of breathing GI: soft, nontender, nondistended, + BS MS: no deformity or atrophy  Skin: warm and dry, no rash Neuro:  Alert and Oriented x 3, Strength and sensation are intact, follows commands Psych: euthymic mood, full affect  Wt Readings from Last 3 Encounters:  01/28/17 179 lb 12.8 oz (81.6 kg)  01/23/17 182 lb (82.6 kg)  12/31/16 180 lb (81.6 kg)      Studies/Labs Reviewed:   EKG:  EKG was ordered today and personally reviewed by me and demonstrates NSR 60bpm, TWI avL, no acute ST-T changes   Recent Labs: 05/26/2016: B Natriuretic Peptide 328.4 05/27/2016: Magnesium 1.9 08/18/2016: TSH 1.506 11/25/2016: ALT 11; BUN 18; Creatinine, Ser 1.12; Potassium 4.7; Sodium 137 01/23/2017: Hemoglobin 11.5; Platelets 182.0   Lipid Panel    Component Value Date/Time   CHOL 206 (H) 03/16/2015 0759   CHOL 211 (H) 10/03/2013 0515   TRIG 369 (H) 03/16/2015 0759   TRIG 272 (H) 10/03/2013 0515   HDL 21 (L) 03/16/2015 0759   HDL 22 (L) 10/03/2013 0515   CHOLHDL 9.8 (H) 03/16/2015 0759   VLDL 74 (H) 03/16/2015 0759   VLDL 54 (H) 10/03/2013 0515   LDLCALC 111 03/16/2015 0759   LDLCALC 135 (H) 10/03/2013 0515   LDLDIRECT 127.0  07/07/2014 0820    Additional studies/ records that were reviewed today include: Summarized above    ASSESSMENT & PLAN:   1. CAD - doing well without any new anginal symptoms. She remains on ASA/Effient. I will send Dr. Angelena Form a message to clarify the duration of DAPT given her issues earlier this year with diverticulitis. She has h/o statin intolerance. She did express some interest in PCSK-9 inhibitor therapy but only if affordable. Per note this year it appears there were assistance options available. We have offered to refer her back today to discuss. She will think about it. 2. S/p AVR - soft murmur noted. Several prior APP notes indicate 1-2/6 SEM c/w exam today. Echo 05/2016 was stable and she has no new symptoms. Will defer further monitoring of AVR by echocardiogram to primary cardiologist. Also reminded patient of need for SBE prophylaxis - she said she has false teeth. 3. Chronic diastolic CHF - appears euvolemic. May have been in setting of IV fluid resuscitation in 08/2016. Discussed 2g sodium diet, 2L fluid restriction. BP is mildly above goal but given her age and history of dizziness and recent BP of 120/70 at outside office, would monitor for now. 4. AV block - not seen on EKG today. Dizziness improved after discontinuation of Mid-Nite OTC supplement. Monitor for now. Not on any BB due to this.  5. PAD - she reports she was seen by vascular who "blew her off" and told her it was nothing to worry about. Radiology recommends f/u US in 5 years. She reports she has since seen a different doctor who plans to repeat scan in 1 year. We can reassess this at next visit.  Disposition: F/u with Dr. Angelena Form in 6 months.  Medication Adjustments/Labs and Tests  Ordered: Current medicines are reviewed at length with the patient today.  Concerns regarding medicines are outlined above. Medication changes, Labs and Tests ordered today are summarized above and listed in the Patient Instructions  accessible in Encounters.   Signed, Charlie Pitter, PA-C  01/28/2017 10:49 AM    [facility] [place], [place], [place]  [address] Phone: [phone]; Fax: [phone]

## 2017-01-28 ENCOUNTER — Telehealth: Payer: Self-pay | Admitting: Physician Assistant

## 2017-01-28 ENCOUNTER — Encounter: Payer: Self-pay | Admitting: Physician Assistant

## 2017-01-28 ENCOUNTER — Ambulatory Visit (INDEPENDENT_AMBULATORY_CARE_PROVIDER_SITE_OTHER): Payer: Medicare Other | Admitting: Physician Assistant

## 2017-01-28 VITALS — BP 142/70 | HR 65 | Ht 66.0 in | Wt 179.8 lb

## 2017-01-28 DIAGNOSIS — I4430 Unspecified atrioventricular block: Secondary | ICD-10-CM

## 2017-01-28 DIAGNOSIS — I5032 Chronic diastolic (congestive) heart failure: Secondary | ICD-10-CM

## 2017-01-28 DIAGNOSIS — I2510 Atherosclerotic heart disease of native coronary artery without angina pectoris: Secondary | ICD-10-CM | POA: Diagnosis not present

## 2017-01-28 DIAGNOSIS — Z952 Presence of prosthetic heart valve: Secondary | ICD-10-CM

## 2017-01-28 DIAGNOSIS — I739 Peripheral vascular disease, unspecified: Secondary | ICD-10-CM

## 2017-01-28 LAB — BASIC METABOLIC PANEL
BUN/Creatinine Ratio: 27 (ref 12–28)
BUN: 22 mg/dL (ref 8–27)
CALCIUM: 8.7 mg/dL (ref 8.7–10.3)
CHLORIDE: 98 mmol/L (ref 96–106)
CO2: 23 mmol/L (ref 20–29)
Creatinine, Ser: 0.82 mg/dL (ref 0.57–1.00)
GFR calc Af Amer: 83 mL/min/{1.73_m2} (ref >59)
GFR calc non Af Amer: 72 mL/min/{1.73_m2} (ref >59)
Glucose: 82 mg/dL (ref 65–99)
POTASSIUM: 4.1 mmol/L (ref 3.5–5.2)
Sodium: 139 mmol/L (ref 134–144)

## 2017-01-28 MED ORDER — ASPIRIN EC 81 MG PO TBEC: 81.0000 mg | DELAYED_RELEASE_TABLET | Freq: Every day | ORAL | 3 refills | Status: DC

## 2017-01-28 NOTE — Telephone Encounter (Signed)
Follow Up:     Returning [name]'s call,concerning pt's lab work.

## 2017-01-28 NOTE — Patient Instructions (Addendum)
Medication Instructions:  Your physician recommends that you continue on your current medications as directed. Please refer to the Current Medication list given to you today.   Labwork: TODAY:  BMET  Testing/Procedures: None ordered  Follow-Up:  Your physician recommends that you schedule a follow-up appointment in: [place] physician wants you to follow-up in: [place] DR. Angelena Form  You will receive a reminder letter in the mail two months in advance. If you don't receive a letter, please call our office to schedule the follow-up appointment.    Any Other Special Instructions Will Be Listed Below (If Applicable).     If you need a refill on your cardiac medications before your next appointment, please call your pharmacy.

## 2017-01-28 NOTE — Telephone Encounter (Signed)
Pt aware of lab results ./cy 

## 2017-01-29 ENCOUNTER — Encounter: Payer: Self-pay | Admitting: Internal Medicine

## 2017-02-01 ENCOUNTER — Telehealth: Payer: Self-pay | Admitting: *Deleted

## 2017-02-01 NOTE — Telephone Encounter (Signed)
Spoke with pt and husband and they state they don't need a clearance appt because she won't be having a the procedure. Contacted [facility] and informed them. Nothing further needed.

## 2017-02-01 NOTE — Progress Notes (Signed)
[name], I think we could stop Effient is needed. After a cutting balloon angioplasty, there probably is no data beyond to 3 months. If presentation is with ACS, I would keep DAPT for one year. I leave many of these folks with unstable angina and  DES on DAPT if they have no bleeding issues. If her GI issues are a problem, we can stop Effient and just continue ASA. Thanks for seeing her. Gerald Stabs

## 2017-02-01 NOTE — Telephone Encounter (Signed)
Received request from [place] for pt to get pulmonary clearance for a colonoscopy/EGD. Called and spoke with husband who states pt is at home at this time and he will have her to call me when he gets home. Will wait for return call.

## 2017-02-06 ENCOUNTER — Telehealth: Payer: Self-pay | Admitting: Physician Assistant

## 2017-02-06 NOTE — Addendum Note (Signed)
Addended by: Gaetano Net on: 02/06/2017 10:28 AM   Modules accepted: Orders

## 2017-02-06 NOTE — Telephone Encounter (Signed)
Please let patient know I talked to Dr. Angelena Form - given her issues with GI bleeding and anemia earlier this year, we feel she can stop Effient but please continue aspirin daily. Shaakira Borrero PA-C

## 2017-02-06 NOTE — Telephone Encounter (Signed)
Called pt, per Melina Copa, PA-C.  Spoke with Husband, Francee Piccolo, [place] on file, to let him know that we had spoke to Dr. Angelena Form and they both felt that due to her gi bleed and anemia earlier this year, it is ok for her to stop the Effient, but she is to continue ASA 81 mg daily.   He thanked me for my call and verbalized understanding.

## 2017-02-13 ENCOUNTER — Ambulatory Visit: Payer: Medicare Other

## 2017-02-19 ENCOUNTER — Emergency Department
Admission: EM | Admit: 2017-02-19 | Discharge: 2017-02-19 | Disposition: A | Discharge status: Home / Self Care | Payer: Medicare Other | Attending: Emergency Medicine | Admitting: Emergency Medicine

## 2017-02-19 ENCOUNTER — Encounter: Payer: Self-pay | Admitting: Emergency Medicine

## 2017-02-19 DIAGNOSIS — Z79899 Other long term (current) drug therapy: Secondary | ICD-10-CM | POA: Diagnosis not present

## 2017-02-19 DIAGNOSIS — I110 Hypertensive heart disease with heart failure: Secondary | ICD-10-CM | POA: Insufficient documentation

## 2017-02-19 DIAGNOSIS — E039 Hypothyroidism, unspecified: Secondary | ICD-10-CM | POA: Insufficient documentation

## 2017-02-19 DIAGNOSIS — J449 Chronic obstructive pulmonary disease, unspecified: Secondary | ICD-10-CM | POA: Diagnosis not present

## 2017-02-19 DIAGNOSIS — I5032 Chronic diastolic (congestive) heart failure: Secondary | ICD-10-CM | POA: Diagnosis not present

## 2017-02-19 DIAGNOSIS — J45909 Unspecified asthma, uncomplicated: Secondary | ICD-10-CM | POA: Insufficient documentation

## 2017-02-19 DIAGNOSIS — G4700 Insomnia, unspecified: Secondary | ICD-10-CM | POA: Insufficient documentation

## 2017-02-19 DIAGNOSIS — F17210 Nicotine dependence, cigarettes, uncomplicated: Secondary | ICD-10-CM | POA: Insufficient documentation

## 2017-02-19 DIAGNOSIS — Z7982 Long term (current) use of aspirin: Secondary | ICD-10-CM | POA: Insufficient documentation

## 2017-02-19 DIAGNOSIS — I2510 Atherosclerotic heart disease of native coronary artery without angina pectoris: Secondary | ICD-10-CM | POA: Insufficient documentation

## 2017-02-19 DIAGNOSIS — Z794 Long term (current) use of insulin: Secondary | ICD-10-CM | POA: Insufficient documentation

## 2017-02-19 DIAGNOSIS — E138 Other specified diabetes mellitus with unspecified complications: Secondary | ICD-10-CM | POA: Diagnosis not present

## 2017-02-19 DIAGNOSIS — Z951 Presence of aortocoronary bypass graft: Secondary | ICD-10-CM | POA: Diagnosis not present

## 2017-02-19 DIAGNOSIS — Z954 Presence of other heart-valve replacement: Secondary | ICD-10-CM | POA: Diagnosis not present

## 2017-02-19 LAB — GLUCOSE, CAPILLARY: Glucose-Capillary: 122 mg/dL — ABNORMAL HIGH (ref 65–99)

## 2017-02-19 NOTE — ED Notes (Addendum)
Per pt - After taking 2 Trazodone tablets every night for 30 years -she developed vertigo and gastroparesis so three nights ago she stopped taking and now she has not slept in three nights  Pt reports "feeling better" not taking the trazodone but reports she is here today with c/o not being able to sleep    Verbalizes that she has trazodone at home but she does not want to take anymore  PCP Candiss Norse

## 2017-02-19 NOTE — Discharge Instructions (Signed)
Try taking melatonin over the counter for your insomnia.  If this doesn't work, it is recommended that you continue your trazodone until you can discuss your symptoms with your doctor.  You may try taking a half-dose of the trazodone, or try taking it every other day.  Your blood sugar today is 120. Continue monitoring your blood sugar closely and take your insulin.

## 2017-02-19 NOTE — ED Triage Notes (Addendum)
Pt to triage via w/c, hyperventilating, appears very anxious; pt reports stopped trazadone 3 days ago and since unable to sleep and feeling nervous; has been on med x 68yrs but felt that she was becoming allergic to it so stopped on her own; pt calms somewhat while talking during triage process

## 2017-02-19 NOTE — ED Provider Notes (Signed)
[facility] Emergency Department Provider Note  ____________________________________________  Time seen: Approximately 9:17 AM  I have reviewed the triage vital signs and the nursing notes.   HISTORY  Chief Complaint Medication Reaction    HPI [name] is a 71 y.o. female who complains of insomnia the past three days after she stopped taking her trazodone.  She stopped the trazodone because she thought it is causing her to have dizziness, sweats, and abdominal pain.  She reports those symptoms have been recurrent for years. She has been on trazodone for 30 years.    She also reports labile blood sugars over the past 3 weeks.  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 She decreased this by half due to low blood sugars which cause identical symptoms of abdominal pain, dizziness, and sweats.  symptoms of been intermittent, chronic. No aggravating or alleviating factors that she is able to pinpoint although she suspects trazodone versus blood sugar changes.  Currently denies any symptoms. No chest pain shortness of breath or exertional symptoms. No headache vision changes numbness tingling weakness     Past Medical History:  Diagnosis Date  . 1st degree AV block   . ACE-inhibitor cough   . Allergic rhinitis   . Anemia    iron deficiency anemia  . Anxiety   . Aortic ectasia (HCC)    a. CT abd in 12/2016 incidentally noted aortic atherosclerosis and infrarenal abdominal aortic ectasia measuring as large as 2.7 cm with recommendation to repeat US in 2023.  Marland Kitchen Arthritis   . Asthma   . Cataract   . Chronic depression   . Chronic diastolic CHF (congestive heart failure) ([place])   . Chronic headache   . COPD (chronic obstructive pulmonary disease) ([place])   . Coronary artery disease    a. DES to RCA and mid Cx 2009. b. CABG and bioprosthetic AVR May 2015. c. cutting balloon to prox Cx in 05/2016  . Diabetes mellitus    type  2  . Diverticulitis of colon   . Essential hypertension   . GERD (gastroesophageal reflux disease)   . Hearing loss   . History of blood transfusion 2013  . History of prosthetic aortic valve replacement   . HOH (hard of hearing)   . Hypercholesterolemia    intolerance of statins and niaspan  . Mobitz type 1 second degree AV block   . OSA (obstructive sleep apnea)    mild, intolerant of cpap  . PAD (peripheral artery disease) ([place])    a. atherosclerosis by CT abd 12/2016 in [place].  Marland Kitchen PONV (postoperative nausea and vomiting)   . Statin intolerance   . Thyroid disease      Patient Active Problem List   Diagnosis Date Noted  . 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 Acute posthemorrhagic anemia 08/24/2016  . Acute respiratory failure with hypoxia ([place]) 08/24/2016  . Acute diastolic CHF (congestive heart failure) ([place]) 08/24/2016  . Hyponatremia 08/24/2016  . Leukocytosis 08/24/2016  . Fever 08/24/2016  . Diarrhea 08/24/2016  . Generalized weakness 08/24/2016  . Acute diverticulitis 08/16/2016  . Lower GI bleed   . NSTEMI (non-ST elevated myocardial infarction) ([place])   . AVB (atrioventricular block)   . Atypical chest pain   . Unstable angina ([place]) 05/25/2016  . Palpitations 05/25/2016  . PUD - recent Rx for H.Pylori 05/25/2016  . Stenosis of coronary artery stent   . Recurrent major depressive disorder, in partial remission ([place]) 07/29/2015  .  Parotiditis 04/25/2015  . Viral URI 03/24/2015  . Hypoglycemia 08/25/2014  . Nausea without vomiting 08/25/2014  . Dyslipidemia-statin ontol   . Long-term insulin use ([place]) 03/31/2014  . Aortic valve disorder 11/11/2013  . 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 Coronary Artery Disease s/p CABG 10/2013 10/10/2013  . Other malaise and fatigue 09/11/2013  . Alopecia 03/06/2013  . Insomnia 02/06/2013  . Gastroesophageal reflux disease without esophagitis 01/19/2013  . Dysphagia, unspecified(787.20) 01/19/2013  . Dyspnea 11/17/2012   . Weakness 11/17/2012  . Abnormal MRI of head 09/05/2012  . Depression with anxiety 03/21/2012  . Extrinsic asthma 11/12/2011  . Anemia 06/07/2011  . Chronic obstructive pulmonary disease ([place]) 01/25/2010  . Acquired hypothyroidism 08/10/2009  . MICROSCOPIC HEMATURIA 05/07/2009  . GOITER 03/02/2009  . Essential hypertension 01/06/2009  . MEMORY LOSS 06/23/2008  . Obstructive sleep apnea 12/25/2007  . Insulin dependent diabetes mellitus ([place]) 09/09/2006  . ALLERGIC RHINITIS 09/09/2006  . DIVERTICULOSIS, COLON 09/09/2006     Past Surgical History:  Procedure Laterality Date  . ABDOMINAL HYSTERECTOMY    . ABDOMINAL HYSTERECTOMY W/ PARTIAL VAGINACTOMY    . AORTIC VALVE REPLACEMENT N/A 10/12/2013   Procedure: AORTIC VALVE REPLACEMENT (AVR);  Surgeon: [name], MD;  Location: [place];  Service: Open Heart Surgery;  Laterality: N/A;  . [place]  . BARTHOLIN GLAND CYST EXCISION    . BLADDER SUSPENSION    . BREAST BIOPSY Bilateral 09/11/2000   neg  . BREAST BIOPSY Left 07/24/2010   neg  . BREAST CYST EXCISION  1988   bilateral nonmalignant tumors, x3  . CARDIAC CATHETERIZATION    . CARDIAC CATHETERIZATION N/A 05/25/2016   Procedure: Coronary Balloon Angioplasty;  Surgeon: Leonie Man, MD;  Location: [place] CV [facility];  Service: Cardiovascular;  Laterality: N/A;  . CARDIAC CATHETERIZATION N/A 05/25/2016   Procedure: Coronary/Graft Angiography;  Surgeon: Leonie Man, MD;  Location: [place] [facility];  Service: Cardiovascular;  Laterality: N/A;  . CATARACT EXTRACTION W/ INTRAOCULAR LENS  IMPLANT, BILATERAL    . CHOLECYSTECTOMY  2001  . COLECTOMY     lap sigmoid  . COLONOSCOPY  2014   polyps found, 2 clamped off.  . CORONARY ANGIOPLASTY  10/29/2007   Prox RCA & Mid Cx.  . CORONARY ARTERY BYPASS GRAFT N/A 10/12/2013   Procedure: CORONARY ARTERY BYPASS GRAFT TIMES TWO;  Surgeon: [name], MD;  Location: [place];  Service: Open Heart Surgery;  Laterality: N/A;   . LEFT HEART CATHETERIZATION WITH CORONARY ANGIOGRAM N/A 10/09/2013   Procedure: LEFT HEART CATHETERIZATION WITH CORONARY ANGIOGRAM;  Surgeon: [name], MD;  Location: [facility] CATH LAB;  Service: Cardiovascular;  Laterality: N/A;  . STERNAL WIRES REMOVAL N/A 04/13/2014   Procedure: STERNAL WIRES REMOVAL;  Surgeon: [name], MD;  Location: [facility] OR;  Service: Thoracic;  Laterality: N/A;  . THYROIDECTOMY    . TONSILLECTOMY    . TUBAL LIGATION    . VAGINAL DELIVERY     3  . VISCERAL ARTERY INTERVENTION N/A 08/16/2016   Procedure: Visceral Artery Intervention;  Surgeon: Algernon Huxley, MD;  Location: [place] [facility];  Service: Cardiovascular;  Laterality: N/A;     Prior to Admission medications   Medication Sig Start Date End Date Taking? Authorizing Provider  albuterol (PROAIR HFA) 108 (90 Base) MCG/ACT inhaler Inhale 2 puffs into the lungs every 4 (four) hours as needed for wheezing or shortness of breath. 07/17/16   Flora Lipps, MD  aspirin EC 81 MG tablet Take 1 tablet (81 mg total) by mouth daily. 01/28/17   Dunn, Nedra Hai, PA-C  Calcium-Vitamin D (CALTRATE 600 PLUS-VIT D PO) Take 2 tablets by mouth 2 (two) times daily.     [provider]  FLUoxetine (PROZAC) 40 MG capsule Take 40 mg by mouth 2 (two) times daily. 07/29/15   [provider]  insulin NPH-regular Human (NOVOLIN 70/30) (70-30) 100 UNIT/ML injection Inject 70 Units into the skin 2 (two) times daily.    [provider]  levothyroxine (SYNTHROID, LEVOTHROID) 150 MCG tablet Take 150 mcg by mouth daily before breakfast.    [provider]  nitroGLYCERIN (NITROSTAT) 0.4 MG SL tablet Place 0.4 mg under the tongue every 5 (five) minutes as needed for chest pain.    [provider]  ondansetron (ZOFRAN) 4 MG tablet Take 1 tablet (4 mg total) by mouth every 6 (six) hours as needed for nausea. 08/24/16   Theodoro Grist, MD  [facility] DELICA LANCETS 58K MISC USE TO CHECK BLOOD SUGAR  THREE TIMES DAILY 05/17/14   [provider]  pantoprazole (PROTONIX) 20 MG tablet Take 20 mg by mouth daily. 01/23/17   [provider]  traZODone (DESYREL) 100 MG tablet TAKE TWO TABLETS BY MOUTH AT BEDTIME 08/15/15   Lucille Passy, MD     Allergies Amitriptyline; Benadryl [diphenhydramine]; Demerol [meperidine]; Gabapentin; Loratadine; Meperidine hcl; Mirtazapine; Olanzapine; Voltaren [diclofenac sodium]; Zetia [ezetimibe]; Ativan [lorazepam]; Atorvastatin; Budesonide-formoterol fumarate; Bupropion hcl; Caffeine; Codeine sulfate; Lisinopril; Metformin; Mometasone furoate; Morphine sulfate; Other; Oxycodone-acetaminophen; Pioglitazone; Propoxyphene n-acetaminophen; Rosuvastatin; Shellfish allergy; Ticagrelor; Tramadol; Venlafaxine; Zolpidem tartrate; and Latex   Family History  Problem Relation Age of Onset  . Breast cancer Mother 44  . Hypertension Father   . Mesothelioma Father   . Asthma Father   . Stroke Paternal Grandfather   . Heart disease Unknown   . Breast cancer Maternal Aunt   . Breast cancer Paternal Aunt     Social History Social History  Substance Use Topics  . Smoking status: Former Smoker    Packs/day: 0.50    Years: 30.00    Types: Cigarettes    Quit date: 10/02/2013  . Smokeless tobacco: Never Used  . Alcohol use No    Review of Systems  Constitutional:   No fever or chills.  ENT:   No sore throat. No rhinorrhea. Cardiovascular:   No chest pain or syncope. Respiratory:   No dyspnea or cough. Gastrointestinal:   occasional generalized abdominal pain without vomiting and diarrhea.  Musculoskeletal:   Negative for focal pain or swelling All other systems reviewed and are negative except as documented above in ROS and HPI.  ____________________________________________   PHYSICAL EXAM:  VITAL SIGNS: ED Triage Vitals [02/19/17 0646]  Enc Vitals Group     BP (!) 176/100     Pulse Rate 79     Resp 18     Temp 98 F (36.7 C)     Temp  Source Oral     SpO2 98 %     Weight 181 lb (82.1 kg)     Height 5\' 6"  (1.676 m)     Head Circumference      Peak Flow      Pain Score      Pain Loc      Pain Edu?      Excl. in [place]?     Vital signs reviewed, nursing assessments reviewed.   Constitutional:   Alert and oriented.  Well appearing and in no distress. Eyes:   No scleral icterus.  EOMI. No nystagmus. No conjunctival pallor. PERRL. ENT   Head:   Normocephalic and atraumatic.   Nose:   No congestion/rhinnorhea.    Mouth/Throat:   MMM, no pharyngeal erythema. No peritonsillar mass.    Neck:   No meningismus. Full ROM Hematological/Lymphatic/Immunilogical:   No cervical lymphadenopathy. Cardiovascular:   RRR. Symmetric bilateral radial and DP pulses.  No murmurs.  Respiratory:   Normal respiratory effort without tachypnea/retractions. Breath sounds are clear and equal bilaterally. No wheezes/rales/rhonchi. Gastrointestinal:   Soft and nontender. Non distended. There is no CVA tenderness.  No rebound, rigidity, or guarding. Genitourinary:   deferred Musculoskeletal:   Normal range of motion in all extremities. No joint effusions.  No lower extremity tenderness.  No edema. Neurologic:   Normal speech and language.  Motor grossly intact. No gross focal neurologic deficits are appreciated.  Skin:    Skin is warm, dry and intact. No rash noted.  No petechiae, purpura, or bullae.  ____________________________________________    LABS (pertinent positives/negatives) (all labs ordered are listed, but only abnormal results are displayed) Labs Reviewed  GLUCOSE, CAPILLARY - Abnormal; Notable for the following:       Result Value   Glucose-Capillary 122 (*)    All other components within normal limits  CBG MONITORING, ED   ____________________________________________   EKG    ____________________________________________    RADIOLOGY  No results  found.  ____________________________________________   PROCEDURES Procedures  ____________________________________________   INITIAL IMPRESSION / ASSESSMENT AND PLAN / ED COURSE  Pertinent labs & imaging results that were available during my care of the patient were reviewed by me and considered in my medical decision making (see chart for details).  patient well appearing no acute distress, presents with symptoms, concern for trazodone side effects. May be related to her trazodone use, and I certainly think that her insomnia the last 3 days is because she stopped taking trazodone 3 days ago. However, her symptoms could also be due to episodes of hypoglycemia. She denies ever seeing blood sugars greater than 300, and does not have symptoms of dehydration or ketoacidosis. Blood sugar is currently normal. Advised her she should continue her medications until she talks to her doctor, but if she is not to take the trazodone anymore she can try antihistamine versus melatonin. She has no hallucinations, does not appear to be manic, no SI or HI, psychiatrically stable and medically stable and suitable for outpatient follow-up.      ____________________________________________   FINAL CLINICAL IMPRESSION(S) / ED DIAGNOSES  Final diagnoses:  Insomnia, unspecified type  Other specified diabetes mellitus with complication, with long-term current use of insulin ([place])      New Prescriptions   No medications on file     Portions of this note were generated with dragon dictation software. Dictation errors may occur despite best attempts at proofreading.    Carrie Mew, MD 02/19/17 [phone]

## 2017-02-19 NOTE — ED Notes (Signed)
Patient to 19H - Amy RN aware.

## 2017-02-19 NOTE — ED Notes (Signed)

## 2017-03-05 ENCOUNTER — Telehealth: Payer: Self-pay | Admitting: Cardiovascular Disease

## 2017-03-05 NOTE — Telephone Encounter (Signed)
Husband inquiring about Effient and if she should still be taking the medication.  Per his statement - "pt is scared her stents will close if she stops the medication". Informed that 9/5 note advised ok for pt to stop Effient.  Will forward to [place]'s nurse to call pt and discuss when she returns to the office.

## 2017-03-05 NOTE — Telephone Encounter (Signed)
New Message  Pt husband call requesting to speak with RN. He states pt got a surgery clearance to stop her blood thinner. Pt husband does not know the name, but would like to know how long pt is to be off the medication .please call back to discuss

## 2017-03-06 NOTE — Telephone Encounter (Signed)
I spoke with pt's husband and he has no questions at this time.  He is aware pt should continue daily ASA.

## 2017-04-11 DIAGNOSIS — F325 Major depressive disorder, single episode, in full remission: Secondary | ICD-10-CM | POA: Insufficient documentation

## 2017-05-09 ENCOUNTER — Other Ambulatory Visit: Payer: Self-pay

## 2017-05-09 ENCOUNTER — Encounter: Payer: Self-pay | Admitting: Family Medicine

## 2017-05-09 ENCOUNTER — Encounter: Payer: Self-pay | Admitting: *Deleted

## 2017-05-09 ENCOUNTER — Ambulatory Visit: Payer: Medicare Other | Admitting: Family Medicine

## 2017-05-09 VITALS — BP 130/80 | HR 69 | Temp 98.8°F | Ht 66.0 in | Wt 191.8 lb

## 2017-05-09 DIAGNOSIS — G8929 Other chronic pain: Secondary | ICD-10-CM | POA: Diagnosis not present

## 2017-05-09 DIAGNOSIS — M25512 Pain in left shoulder: Secondary | ICD-10-CM

## 2017-05-09 MED ORDER — METHYLPREDNISOLONE ACETATE 40 MG/ML IJ SUSP
80.0000 mg | Freq: Once | INTRAMUSCULAR | Status: AC
  Administered 2017-05-09: 80 mg via INTRA_ARTICULAR

## 2017-05-09 NOTE — Progress Notes (Signed)
Dr. Frederico Hamman T. Johniya Durfee, MD, [facility] [place], [address] Phone: [phone] Fax: [phone]  05/09/2017  Patient: [name], MRN: [id number], DOB: March 13, 1946, 71 y.o.  Primary Physician:  Glendon Axe, MD   Chief Complaint  Patient presents with  . Shoulder Pain    Left   Subjective:   [name] is a 71 y.o. very pleasant female patient who presents with the following:  L shoulder: Pleasant lady who is well-known to me has known osteoarthritis of the [facility] joint as well as glenohumeral arthritis and has had prior frozen shoulder in the past as well.  She has been plagued with ongoing intermittent generalized arthritis as well as some chronic shoulder pain intermittent since the entire time of known her for multiple years.  She has had good response to corticosteroid injection previously.  Combined L shoulder injection  Past Medical History, Surgical History, Social History, Family History, Problem List, Medications, and Allergies have been reviewed and updated if relevant.  Patient Active Problem List   Diagnosis Date Noted  . 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 Acute posthemorrhagic anemia 08/24/2016  . Acute respiratory failure with hypoxia ([place]) 08/24/2016  . Acute diastolic CHF (congestive heart failure) ([facility]) 08/24/2016  . Hyponatremia 08/24/2016  . Leukocytosis 08/24/2016  . Fever 08/24/2016  . Diarrhea 08/24/2016  . Generalized weakness 08/24/2016  . Acute diverticulitis 08/16/2016  . Lower GI bleed   . NSTEMI (non-ST elevated myocardial infarction) ([place])   . AVB (atrioventricular block)   . Atypical chest pain   . Unstable angina ([place]) 05/25/2016  . Palpitations 05/25/2016  . PUD - recent Rx for H.Pylori 05/25/2016  . Stenosis of coronary artery stent   . Recurrent major depressive disorder, in partial remission ([place]) 07/29/2015  . Parotiditis 04/25/2015  . Viral URI 03/24/2015    . Hypoglycemia 08/25/2014  . Nausea without vomiting 08/25/2014  . Dyslipidemia-statin ontol   . Long-term insulin use ([place]) 03/31/2014  . Aortic valve disorder 11/11/2013  . 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 Coronary Artery Disease s/p CABG 10/2013 10/10/2013  . Other malaise and fatigue 09/11/2013  . Alopecia 03/06/2013  . Insomnia 02/06/2013  . Gastroesophageal reflux disease without esophagitis 01/19/2013  . Dysphagia, unspecified(787.20) 01/19/2013  . Dyspnea 11/17/2012  . Weakness 11/17/2012  . Abnormal MRI of head 09/05/2012  . Depression with anxiety 03/21/2012  . Extrinsic asthma 11/12/2011  . Anemia 06/07/2011  . Chronic obstructive pulmonary disease ([place]) 01/25/2010  . Acquired hypothyroidism 08/10/2009  . MICROSCOPIC HEMATURIA 05/07/2009  . GOITER 03/02/2009  . Essential hypertension 01/06/2009  . MEMORY LOSS 06/23/2008  . Obstructive sleep apnea 12/25/2007  . Insulin dependent diabetes mellitus ([place]) 09/09/2006  . ALLERGIC RHINITIS 09/09/2006  . DIVERTICULOSIS, COLON 09/09/2006    Past Medical History:  Diagnosis Date  . 1st degree AV block   . ACE-inhibitor cough   . Allergic rhinitis   . Anemia    iron deficiency anemia  . Anxiety   . Aortic ectasia (HCC)    a. CT abd in 12/2016 incidentally noted aortic atherosclerosis and infrarenal abdominal aortic ectasia measuring as large as 2.7 cm with recommendation to repeat US in 2023.  Marland Kitchen Arthritis   . Asthma   . Cataract   . Chronic depression   . Chronic diastolic CHF (congestive heart failure) ([place])   . Chronic headache   . COPD (chronic obstructive pulmonary disease) ([place])   . Coronary  artery disease    a. DES to RCA and mid Cx 2009. b. CABG and bioprosthetic AVR May 2015. c. cutting balloon to prox Cx in 05/2016  . Diabetes mellitus    type 2  . Diverticulitis of colon   . Essential hypertension   . GERD (gastroesophageal reflux disease)   . Hearing loss   . History of blood transfusion 2013  .  History of prosthetic aortic valve replacement   . HOH (hard of hearing)   . Hypercholesterolemia    intolerance of statins and niaspan  . Mobitz type 1 second degree AV block   . OSA (obstructive sleep apnea)    mild, intolerant of cpap  . PAD (peripheral artery disease) ([place])    a. atherosclerosis by CT abd 12/2016 in [place].  Marland Kitchen PONV (postoperative nausea and vomiting)   . Statin intolerance   . Thyroid disease     Past Surgical History:  Procedure Laterality Date  . ABDOMINAL HYSTERECTOMY    . ABDOMINAL HYSTERECTOMY W/ PARTIAL VAGINACTOMY    . AORTIC VALVE REPLACEMENT N/A 10/12/2013   Procedure: AORTIC VALVE REPLACEMENT (AVR);  Surgeon: [name], MD;  Location: [facility];  Service: Open Heart Surgery;  Laterality: N/A;  . [facility]  . BARTHOLIN GLAND CYST EXCISION    . BLADDER SUSPENSION    . BREAST BIOPSY Bilateral 09/11/2000   neg  . BREAST BIOPSY Left 07/24/2010   neg  . BREAST CYST EXCISION  1988   bilateral nonmalignant tumors, x3  . CARDIAC CATHETERIZATION    . CARDIAC CATHETERIZATION N/A 05/25/2016   Procedure: Coronary Balloon Angioplasty;  Surgeon: Leonie Man, MD;  Location: [facility];  Service: Cardiovascular;  Laterality: N/A;  . CARDIAC CATHETERIZATION N/A 05/25/2016   Procedure: Coronary/Graft Angiography;  Surgeon: Leonie Man, MD;  Location: [facility];  Service: Cardiovascular;  Laterality: N/A;  . CATARACT EXTRACTION W/ INTRAOCULAR LENS  IMPLANT, BILATERAL    . CHOLECYSTECTOMY  2001  . COLECTOMY     lap sigmoid  . COLONOSCOPY  2014   polyps found, 2 clamped off.  . CORONARY ANGIOPLASTY  10/29/2007   Prox RCA & Mid Cx.  . CORONARY ARTERY BYPASS GRAFT N/A 10/12/2013   Procedure: CORONARY ARTERY BYPASS GRAFT TIMES TWO;  Surgeon: [name], MD;  Location: [facility];  Service: Open Heart Surgery;  Laterality: N/A;  . LEFT HEART CATHETERIZATION WITH CORONARY ANGIOGRAM N/A 10/09/2013   Procedure: LEFT HEART CATHETERIZATION WITH  CORONARY ANGIOGRAM;  Surgeon: [name], MD;  Location[facility];  Service: Cardiovascular;  Laterality: N/A;  . STERNAL WIRES REMOVAL N/A 04/13/2014   Procedure: STERNAL WIRES REMOVAL;  Surgeon: [name], MD;  Location: [facility];  Service: Thoracic;  Laterality: N/A;  . THYROIDECTOMY    . TONSILLECTOMY    . TUBAL LIGATION    . VAGINAL DELIVERY     3  . VISCERAL ARTERY INTERVENTION N/A 08/16/2016   Procedure: Visceral Artery Intervention;  Surgeon: Algernon Huxley, MD;  Location: [facility];  Service: Cardiovascular;  Laterality: N/A;    Social History   Socioeconomic History  . Marital status: Married    Spouse name: Not on file  . Number of children: 3  . Years of education: Not on file  . Highest education level: Not on file  Social Needs  . Financial resource strain: Not on file  . Food insecurity - worry: Not on file  .  Food insecurity - inability: Not on file  . Transportation needs - medical: Not on file  . Transportation needs - non-medical: Not on file  Occupational History  . Occupation: Retired    Fish farm manager: UNEMPLOYED    Comment: CNA  Tobacco Use  . Smoking status: Former Smoker    Packs/day: 0.50    Years: 30.00    Pack years: 15.00    Types: Cigarettes    Last attempt to quit: 10/02/2013    Years since quitting: 3.6  . Smokeless tobacco: Never Used  Substance and Sexual Activity  . Alcohol use: No  . Drug use: No  . Sexual activity: Not on file  Other Topics Concern  . Not on file  Social History Narrative   Does not have Living Will   Desires CPR, would not want prolonged life support if futile.    Family History  Problem Relation Age of Onset  . Breast cancer Mother 55  . Hypertension Father   . Mesothelioma Father   . Asthma Father   . Stroke Paternal Grandfather   . Heart disease Unknown   . Breast cancer Maternal Aunt   . Breast cancer Paternal Aunt     Allergies  Allergen Reactions  . Amitriptyline Other (See  Comments)    Unknown reaction  . Benadryl [Diphenhydramine] Shortness Of Breath  . Demerol [Meperidine] Other (See Comments)    Unknown reaction  . Gabapentin Other (See Comments)    Unknown reaction  . Loratadine Other (See Comments)    Unknown reaction  . Meperidine Hcl Other (See Comments)    Unknown reaction  . Mirtazapine Other (See Comments)    Unknown reaction  . Olanzapine Other (See Comments)    Unknown reaction   . Voltaren [Diclofenac Sodium] Shortness Of Breath  . Zetia [Ezetimibe] Other (See Comments)    Weakness in legs, shakiness all over  . Ativan [Lorazepam] Other (See Comments)    Causes double vision at highter than .5 mg dose  . Atorvastatin Other (See Comments)    Muscle aches and weakness  . Budesonide-Formoterol Fumarate Other (See Comments)    Shakiness, tremors  . Bupropion Hcl Other (See Comments)    "cloud over me" depression  . Caffeine Other (See Comments)    jitters  . Codeine Sulfate Other (See Comments)    Makes chest hurt like a heart attack  . Lisinopril Cough  . Metformin Nausea And Vomiting  . Mometasone Furoate Nausea And Vomiting  . Morphine Sulfate Other (See Comments)    Chest pain like a heart attack  . Other Other (See Comments)    Beta Blockers, reaction shortness of breath  . Oxycodone-Acetaminophen Nausea And Vomiting  . Pioglitazone Other (See Comments)    Cannot take because of risk of bladder cancer  . Propoxyphene N-Acetaminophen Nausea And Vomiting  . Rosuvastatin Other (See Comments)    Muscle aches and weakness  . Shellfish Allergy Diarrhea  . Ticagrelor     Other reaction(s): Other (See Comments) "slowed heart rate" & chest pain  . Tramadol Nausea Only  . Trazodone And Nefazodone Nausea And Vomiting  . Venlafaxine Other (See Comments)    Unknown reaction  . Zolpidem Tartrate Other (See Comments)     Jittery, diarrhea  . Latex Rash    Medication list reviewed and updated in full in [facility].  GEN:  No fevers, chills. Nontoxic. Primarily MSK c/o today. MSK: Detailed in the HPI GI: tolerating PO intake without difficulty  Neuro: No numbness, parasthesias, or tingling associated. Otherwise the pertinent positives of the ROS are noted above.   Objective:   BP 130/80   Pulse 69   Temp 98.8 F (37.1 C) (Oral)   Ht 5\' 6"  (1.676 m)   Wt 191 lb 12 oz (87 kg)   BMI 30.95 kg/m    GEN: Well-developed,well-nourished,in no acute distress; alert,appropriate and cooperative throughout examination HEENT: Normocephalic and atraumatic without obvious abnormalities. Ears, externally no deformities PULM: Breathing comfortably in no respiratory distress EXT: No clubbing, cyanosis, or edema PSYCH: Normally interactive. Cooperative during the interview. Pleasant. Friendly and conversant. Not anxious or depressed appearing. Normal, full affect.  Shoulder: L Inspection: No muscle wasting or winging Ecchymosis/edema: neg  AC joint, scapula, clavicle: NT Cervical spine: NT, full ROM Spurling's: neg Abduction: full, 5/5 Flexion: full, 5/5 IR, full, lift-off: 5/5 ER at neutral: full, 5/5 AC crossover: neg Neer: pos Hawkins: pos Drop Test: neg Empty Can: pos Supraspinatus insertion: mild-mod T Bicipital groove: NT Speed's: neg Yergason's: neg Sulcus sign: neg Scapular dyskinesis: none C5-T1 intact  Neuro: Sensation intact Grip 5/5   Radiology: No results found.  Assessment and Plan:   Chronic left shoulder pain - Plan: methylPREDNISolone acetate (DEPO-MEDROL) injection 80 mg  We will do a subacromial and combined glenohumeral injection for pain management for this patient with chronic shoulder pain.  I do think she has some current subacromial bursitis likely rotator cuff tendinopathy combined with arthritis.  Intrarticular Shoulder Injection, L Verbal consent was obtained from the patient. Risks including infection explained and contrasted with benefits and alternatives. Patient  prepped with Chloraprep and Ethyl Chloride used for anesthesia. An intraarticular shoulder injection was performed using the posterior approach. The patient tolerated the procedure well and had decreased pain post injection. No complications. Injection: 4 cc of Lidocaine 1% and 1 mL Depo-Medrol 40 mg. Needle: 22 gauge   SubAC Injection, L Verbal consent was obtained from the patient. Risks (including rare infection), benefits, and alternatives were explained. Patient prepped with Chloraprep and Ethyl Chloride used for anesthesia. The subacromial space was injected using the posterior approach. The patient tolerated the procedure well and had decreased pain post injection. No complications. Injection: 4 cc of Lidocaine 1% and 1 mL of Depo-Medrol 40 mg. Needle: 22 gauge     Follow-up: No Follow-up on file.  Meds ordered this encounter  Medications  . methylPREDNISolone acetate (DEPO-MEDROL) injection 80 mg   Medications Discontinued During This Encounter  Medication Reason  . levothyroxine (SYNTHROID, LEVOTHROID) 150 MCG tablet Cost of medication  . traZODone (DESYREL) 100 MG tablet Cost of medication   Signed,  Luci Bellucci T. Aune Adami, MD   Allergies as of 05/09/2017      Reactions   Amitriptyline Other (See Comments)   Unknown reaction   Benadryl [diphenhydramine] Shortness Of Breath   Demerol [meperidine] Other (See Comments)   Unknown reaction   Gabapentin Other (See Comments)   Unknown reaction   Loratadine Other (See Comments)   Unknown reaction   Meperidine Hcl Other (See Comments)   Unknown reaction   Mirtazapine Other (See Comments)   Unknown reaction   Olanzapine Other (See Comments)   Unknown reaction   Voltaren [diclofenac Sodium] Shortness Of Breath   Zetia [ezetimibe] Other (See Comments)   Weakness in legs, shakiness all over   Ativan [lorazepam] Other (See Comments)   Causes double vision at highter than .5 mg dose   Atorvastatin Other (See Comments)   Muscle  aches and  weakness   Budesonide-formoterol Fumarate Other (See Comments)   Shakiness, tremors   Bupropion Hcl Other (See Comments)   "cloud over me" depression   Caffeine Other (See Comments)   jitters   Codeine Sulfate Other (See Comments)   Makes chest hurt like a heart attack   Lisinopril Cough   Metformin Nausea And Vomiting   Mometasone Furoate Nausea And Vomiting   Morphine Sulfate Other (See Comments)   Chest pain like a heart attack   Other Other (See Comments)   Beta Blockers, reaction shortness of breath   Oxycodone-acetaminophen Nausea And Vomiting   Pioglitazone Other (See Comments)   Cannot take because of risk of bladder cancer   Propoxyphene N-acetaminophen Nausea And Vomiting   Rosuvastatin Other (See Comments)   Muscle aches and weakness   Shellfish Allergy Diarrhea   Ticagrelor    Other reaction(s): Other (See Comments) "slowed heart rate" & chest pain   Tramadol Nausea Only   Trazodone And Nefazodone Nausea And Vomiting   Venlafaxine Other (See Comments)   Unknown reaction   Zolpidem Tartrate Other (See Comments)    Jittery, diarrhea   Latex Rash      Medication List        Accurate as of 05/09/17 10:39 AM. Always use your most recent med list.          albuterol 108 (90 Base) MCG/ACT inhaler Commonly known as:  PROAIR HFA Inhale 2 puffs into the lungs every 4 (four) hours as needed for wheezing or shortness of breath.   amLODipine 2.5 MG tablet Commonly known as:  NORVASC Take 2.5 mg by mouth daily.   aspirin EC 81 MG tablet Take 1 tablet (81 mg total) by mouth daily.   CALTRATE 600 PLUS-VIT D PO Take 2 tablets by mouth 2 (two) times daily.   FLUoxetine 40 MG capsule Commonly known as:  PROZAC Take 40 mg by mouth 2 (two) times daily.   insulin NPH-regular Human (70-30) 100 UNIT/ML injection Commonly known as:  NOVOLIN 70/30 Inject 70 Units into the skin 2 (two) times daily.   levothyroxine 137 MCG tablet Commonly known as:   SYNTHROID, LEVOTHROID Take by mouth.   nitroGLYCERIN 0.4 MG SL tablet Commonly known as:  NITROSTAT Place 0.4 mg under the tongue every 5 (five) minutes as needed for chest pain.   ondansetron 4 MG tablet Commonly known as:  ZOFRAN Take 1 tablet (4 mg total) by mouth every 6 (six) hours as needed for nausea.   ONETOUCH DELICA LANCETS 15A Misc USE TO CHECK BLOOD SUGAR THREE TIMES DAILY   pantoprazole 20 MG tablet Commonly known as:  PROTONIX Take 20 mg by mouth daily.

## 2017-07-01 ENCOUNTER — Telehealth: Payer: Self-pay | Admitting: Cardiovascular Disease

## 2017-07-01 NOTE — Telephone Encounter (Signed)
Spoke with patient regarding her phone call about her heart rate fluctuation (measured with pulse oximetry finger device).    At rest, her HR is 70 bpm and with activity, washing clothes at 123 bpm and folding a blanket it escalated to 151 bpm.  At rest it was 71 bpm and then after taking 10 steps it rose to 93 bpm,  Her BP is presently 143/90.    She lately has been SOB, feels tired and some slight heaviness occasionally.  Thank you.

## 2017-07-01 NOTE — Telephone Encounter (Signed)
° °  STAT if HR is under 50 or over 120 (normal HR is 60-100 beats per minute)  1) What is your heart rate?  96  2) Do you have a log of your heart rate readings (document readings)?  84  96  3) Do you have any other symptoms?  143/90

## 2017-07-01 NOTE — Telephone Encounter (Signed)
I have no clinic days this week. She will need to be seen in the office by an office APP if possible for EKG and cardiac monitor. If we are unable to arrange this, we could try to get an EKG as a nurse visit and then arrange a 48 hour cardiac monitor. Thanks, chris

## 2017-07-02 NOTE — Telephone Encounter (Signed)
Appt has been scheduled with B. Simmons, [facility] [place] on 07/04/17 at 8:30.  I spoke with pt's husband and he is aware of this appointment.

## 2017-07-04 ENCOUNTER — Encounter: Payer: Self-pay | Admitting: Cardiology

## 2017-07-04 ENCOUNTER — Ambulatory Visit: Payer: Medicare HMO | Admitting: Cardiology

## 2017-07-04 ENCOUNTER — Ambulatory Visit (INDEPENDENT_AMBULATORY_CARE_PROVIDER_SITE_OTHER): Payer: Medicare HMO

## 2017-07-04 ENCOUNTER — Other Ambulatory Visit: Payer: Self-pay

## 2017-07-04 ENCOUNTER — Ambulatory Visit (HOSPITAL_COMMUNITY): Payer: Medicare HMO | Attending: Cardiology

## 2017-07-04 VITALS — BP 124/64 | HR 76 | Ht 66.0 in | Wt 190.0 lb

## 2017-07-04 DIAGNOSIS — J449 Chronic obstructive pulmonary disease, unspecified: Secondary | ICD-10-CM | POA: Diagnosis not present

## 2017-07-04 DIAGNOSIS — Z952 Presence of prosthetic heart valve: Secondary | ICD-10-CM | POA: Diagnosis not present

## 2017-07-04 DIAGNOSIS — R0600 Dyspnea, unspecified: Secondary | ICD-10-CM | POA: Insufficient documentation

## 2017-07-04 DIAGNOSIS — I77810 Thoracic aortic ectasia: Secondary | ICD-10-CM | POA: Insufficient documentation

## 2017-07-04 DIAGNOSIS — I2510 Atherosclerotic heart disease of native coronary artery without angina pectoris: Secondary | ICD-10-CM | POA: Insufficient documentation

## 2017-07-04 DIAGNOSIS — E7800 Pure hypercholesterolemia, unspecified: Secondary | ICD-10-CM | POA: Insufficient documentation

## 2017-07-04 DIAGNOSIS — E079 Disorder of thyroid, unspecified: Secondary | ICD-10-CM | POA: Insufficient documentation

## 2017-07-04 DIAGNOSIS — R002 Palpitations: Secondary | ICD-10-CM

## 2017-07-04 DIAGNOSIS — G4733 Obstructive sleep apnea (adult) (pediatric): Secondary | ICD-10-CM | POA: Insufficient documentation

## 2017-07-04 DIAGNOSIS — I071 Rheumatic tricuspid insufficiency: Secondary | ICD-10-CM | POA: Diagnosis not present

## 2017-07-04 DIAGNOSIS — Z953 Presence of xenogenic heart valve: Secondary | ICD-10-CM | POA: Insufficient documentation

## 2017-07-04 DIAGNOSIS — E1151 Type 2 diabetes mellitus with diabetic peripheral angiopathy without gangrene: Secondary | ICD-10-CM | POA: Diagnosis not present

## 2017-07-04 LAB — COMPREHENSIVE METABOLIC PANEL
ALT: 14 IU/L (ref 0–32)
AST: 15 IU/L (ref 0–40)
Albumin/Globulin Ratio: 1.4 (ref 1.2–2.2)
Albumin: 3.8 g/dL (ref 3.5–4.8)
Alkaline Phosphatase: 128 IU/L — ABNORMAL HIGH (ref 39–117)
BUN / CREAT RATIO: 15 (ref 12–28)
BUN: 15 mg/dL (ref 8–27)
CO2: 21 mmol/L (ref 20–29)
CREATININE: 1.02 mg/dL — AB (ref 0.57–1.00)
Calcium: 8.7 mg/dL (ref 8.7–10.3)
Chloride: 98 mmol/L (ref 96–106)
GFR calc Af Amer: 64 mL/min/{1.73_m2} (ref >59)
GFR, EST NON AFRICAN AMERICAN: 55 mL/min/{1.73_m2} — AB (ref >59)
GLUCOSE: 241 mg/dL — AB (ref 65–99)
Globulin, Total: 2.8 g/dL (ref 1.5–4.5)
Potassium: 4.5 mmol/L (ref 3.5–5.2)
Sodium: 136 mmol/L (ref 134–144)
TOTAL PROTEIN: 6.6 g/dL (ref 6.0–8.5)

## 2017-07-04 LAB — ECHOCARDIOGRAM COMPLETE
Height: 66 in
Weight: 3040 oz

## 2017-07-04 LAB — CBC
HEMATOCRIT: 34.6 % (ref 34.0–46.6)
HEMOGLOBIN: 11.4 g/dL (ref 11.1–15.9)
MCH: 24.5 pg — ABNORMAL LOW (ref 26.6–33.0)
MCHC: 32.9 g/dL (ref 31.5–35.7)
MCV: 74 fL — AB (ref 79–97)
Platelets: 238 10*3/uL (ref 150–379)
RBC: 4.65 x10E6/uL (ref 3.77–5.28)
RDW: 16.2 % — AB (ref 12.3–15.4)
WBC: 10.4 10*3/uL (ref 3.4–10.8)

## 2017-07-04 LAB — TSH: TSH: 0.151 u[IU]/mL — ABNORMAL LOW (ref 0.450–4.500)

## 2017-07-04 NOTE — Progress Notes (Signed)
07/04/2017 Freada Bergeron   1946/01/23  [id number]  Primary Physician Glendon Axe, MD Primary Cardiologist: Dr. Angelena Form   Reason for Visit/CC: Palpitations, tachycardia   HPI:  [name] is a 72 y.o. female who is being seen today for palpitations. She is followed by Dr. Angelena Form with a history of anemia, HTN, HLD, DM, COPD, CAD s/p prior stent placements followed by CABG and AVR May 2015. She was readmitted in Dec 2017, with unstable angina. Cardiac cath with patent LIMA to LAD and SVG to RCA but progression of stent restenosis in the proximal circumflex which was treated with cutting balloon angioplasty. She had Second degree AV block noted in the hospital. She did not tolerate Brilinta and was placedin Effient. She had continued dizziness post discharge and cardiac monitor showed sinus with 1st degree AV block, second degree AV block without diary notation of symptoms during these events. She was seen by Dr. Caryl Comes who did not feel that her bradycardia and heart block was related to her dizziness. He suggested that her dyspnea may be related to chronotropic incompetence and suggested stress testing. Exercise stress test on 07/27/16 with 1 minute 36 seconds of exercise stopped due to dizziness. Heart rate responded normally to exercise, thus CI ruled out. Her PCP thought that perhaps her trazodone was the cause. This was discontinued and her dizziness resolved. At her last cardiology OV 02/2017, her Effient was discontinued to due GIB/ anemia. Her last Hgb in Dec 2018 has improved back to 12.   She presents to clinic today given palpitations and increase in heart rate with minimal activity.  Heart rate can fluctuate up into the 120s 150s with minimal activity.  This is new for her.  She has been checking her pulse rate by use of a home monitor.  She reports that her heart rate improves whenever she stops to rest.  When her heart rate shoots up, she feels short of breath and a little lightheaded.   She denies any chest pain.  No syncope/near syncope.  EKG today shows normal sinus rhythm.  Heart rate 76 bpm.    Current Meds  Medication Sig  . albuterol (PROAIR HFA) 108 (90 Base) MCG/ACT inhaler Inhale 2 puffs into the lungs every 4 (four) hours as needed for wheezing or shortness of breath.  Marland Kitchen amLODipine (NORVASC) 2.5 MG tablet Take 2.5 mg by mouth daily.  Marland Kitchen aspirin EC 81 MG tablet Take 1 tablet (81 mg total) by mouth daily.  . Calcium-Vitamin D (CALTRATE 600 PLUS-VIT D PO) Take 2 tablets by mouth 2 (two) times daily.   Marland Kitchen FLUoxetine (PROZAC) 40 MG capsule Take 40 mg by mouth 2 (two) times daily.  . insulin NPH-regular Human (NOVOLIN 70/30) (70-30) 100 UNIT/ML injection Inject 70 Units into the skin 2 (two) times daily.  Marland Kitchen levothyroxine (SYNTHROID, LEVOTHROID) 137 MCG tablet Take by mouth.  . nitroGLYCERIN (NITROSTAT) 0.4 MG SL tablet Place 0.4 mg under the tongue every 5 (five) minutes as needed for chest pain.  Marland Kitchen ondansetron (ZOFRAN) 4 MG tablet Take 1 tablet (4 mg total) by mouth every 6 (six) hours as needed for nausea.  [name] DELICA LANCETS 67M MISC USE TO CHECK BLOOD SUGAR THREE TIMES DAILY  . pantoprazole (PROTONIX) 20 MG tablet Take 20 mg by mouth daily.   Allergies  Allergen Reactions  . Amitriptyline Other (See Comments)    Unknown reaction  . Benadryl [Diphenhydramine] Shortness Of Breath  . Demerol [Meperidine] Other (See Comments)  Unknown reaction  . Gabapentin Other (See Comments)    Unknown reaction  . Loratadine Other (See Comments)    Unknown reaction  . Meperidine Hcl Other (See Comments)    Unknown reaction  . Mirtazapine Other (See Comments)    Unknown reaction  . Olanzapine Other (See Comments)    Unknown reaction   . Voltaren [Diclofenac Sodium] Shortness Of Breath  . Zetia [Ezetimibe] Other (See Comments)    Weakness in legs, shakiness all over  . Ativan [Lorazepam] Other (See Comments)    Causes double vision at highter than .5 mg dose  .  Atorvastatin Other (See Comments)    Muscle aches and weakness  . Budesonide-Formoterol Fumarate Other (See Comments)    Shakiness, tremors  . Bupropion Hcl Other (See Comments)    "cloud over me" depression  . Caffeine Other (See Comments)    jitters  . Codeine Sulfate Other (See Comments)    Makes chest hurt like a heart attack  . Lisinopril Cough  . Metformin Nausea And Vomiting  . Mometasone Furoate Nausea And Vomiting  . Morphine Sulfate Other (See Comments)    Chest pain like a heart attack  . Other Other (See Comments)    Beta Blockers, reaction shortness of breath  . Oxycodone-Acetaminophen Nausea And Vomiting  . Pioglitazone Other (See Comments)    Cannot take because of risk of bladder cancer  . Propoxyphene N-Acetaminophen Nausea And Vomiting  . Rosuvastatin Other (See Comments)    Muscle aches and weakness  . Shellfish Allergy Diarrhea  . Ticagrelor     Other reaction(s): Other (See Comments) "slowed heart rate" & chest pain  . Tramadol Nausea Only  . Trazodone And Nefazodone Nausea And Vomiting  . Venlafaxine Other (See Comments)    Unknown reaction  . Zolpidem Tartrate Other (See Comments)     Jittery, diarrhea  . Latex Rash   Past Medical History:  Diagnosis Date  . 1st degree AV block   . ACE-inhibitor cough   . Allergic rhinitis   . Anemia    iron deficiency anemia  . Anxiety   . Aortic ectasia (HCC)    a. CT abd in 12/2016 incidentally noted aortic atherosclerosis and infrarenal abdominal aortic ectasia measuring as large as 2.7 cm with recommendation to repeat US in 2023.  Marland Kitchen Arthritis   . Asthma   . Cataract   . Chronic depression   . Chronic diastolic CHF (congestive heart failure) ([place])   . Chronic headache   . COPD (chronic obstructive pulmonary disease) ([place])   . Coronary artery disease    a. DES to RCA and mid Cx 2009. b. CABG and bioprosthetic AVR May 2015. c. cutting balloon to prox Cx in 05/2016  . Diabetes mellitus    type 2  .  Diverticulitis of colon   . Essential hypertension   . GERD (gastroesophageal reflux disease)   . Hearing loss   . History of blood transfusion 2013  . History of prosthetic aortic valve replacement   . HOH (hard of hearing)   . Hypercholesterolemia    intolerance of statins and niaspan  . Mobitz type 1 second degree AV block   . OSA (obstructive sleep apnea)    mild, intolerant of cpap  . PAD (peripheral artery disease) ([place])    a. atherosclerosis by CT abd 12/2016 in [place].  Marland Kitchen PONV (postoperative nausea and vomiting)   . Statin intolerance   . Thyroid disease    Family History  Problem Relation Age of Onset  . Breast cancer Mother 44  . Hypertension Father   . Mesothelioma Father   . Asthma Father   . Stroke Paternal Grandfather   . Heart disease Unknown   . Breast cancer Maternal Aunt   . Breast cancer Paternal Aunt    Past Surgical History:  Procedure Laterality Date  . ABDOMINAL HYSTERECTOMY    . ABDOMINAL HYSTERECTOMY W/ PARTIAL VAGINACTOMY    . AORTIC VALVE REPLACEMENT N/A 10/12/2013   Procedure: AORTIC VALVE REPLACEMENT (AVR);  Surgeon: [name], MD;  Location: [facility];  Service: Open Heart Surgery;  Laterality: N/A;  . [place]  . BARTHOLIN GLAND CYST EXCISION    . BLADDER SUSPENSION    . BREAST BIOPSY Bilateral 09/11/2000   neg  . BREAST BIOPSY Left 07/24/2010   neg  . BREAST CYST EXCISION  1988   bilateral nonmalignant tumors, x3  . CARDIAC CATHETERIZATION    . CARDIAC CATHETERIZATION N/A 05/25/2016   Procedure: Coronary Balloon Angioplasty;  Surgeon: Leonie Man, MD;  Location: [facility];  Service: Cardiovascular;  Laterality: N/A;  . CARDIAC CATHETERIZATION N/A 05/25/2016   Procedure: Coronary/Graft Angiography;  Surgeon: Leonie Man, MD;  Location: [facility];  Service: Cardiovascular;  Laterality: N/A;  . CATARACT EXTRACTION W/ INTRAOCULAR LENS  IMPLANT, BILATERAL    . CHOLECYSTECTOMY  2001  . COLECTOMY     lap sigmoid   . COLONOSCOPY  2014   polyps found, 2 clamped off.  . CORONARY ANGIOPLASTY  10/29/2007   Prox RCA & Mid Cx.  . CORONARY ARTERY BYPASS GRAFT N/A 10/12/2013   Procedure: CORONARY ARTERY BYPASS GRAFT TIMES TWO;  Surgeon: [name], MD;  Location: [facility];  Service: Open Heart Surgery;  Laterality: N/A;  . LEFT HEART CATHETERIZATION WITH CORONARY ANGIOGRAM N/A 10/09/2013   Procedure: LEFT HEART CATHETERIZATION WITH CORONARY ANGIOGRAM;  Surgeon: [name], MD;  Location: [facility];  Service: Cardiovascular;  Laterality: N/A;  . STERNAL WIRES REMOVAL N/A 04/13/2014   Procedure: STERNAL WIRES REMOVAL;  Surgeon: [name], MD;  Location: [facility];  Service: Thoracic;  Laterality: N/A;  . THYROIDECTOMY    . TONSILLECTOMY    . TUBAL LIGATION    . VAGINAL DELIVERY     3  . VISCERAL ARTERY INTERVENTION N/A 08/16/2016   Procedure: Visceral Artery Intervention;  Surgeon: Algernon Huxley, MD;  Location: [facility];  Service: Cardiovascular;  Laterality: N/A;   Social History   Socioeconomic History  . Marital status: Married    Spouse name: Not on file  . Number of children: 3  . Years of education: Not on file  . Highest education level: Not on file  Social Needs  . Financial resource strain: Not on file  . Food insecurity - worry: Not on file  . Food insecurity - inability: Not on file  . Transportation needs - medical: Not on file  . Transportation needs - non-medical: Not on file  Occupational History  . Occupation: Retired    Fish farm manager: UNEMPLOYED    Comment: CNA  Tobacco Use  . Smoking status: Former Smoker    Packs/day: 0.50    Years: 30.00    Pack years: 15.00    Types: Cigarettes    Last attempt to quit: 10/02/2013    Years since quitting: 3.7  . Smokeless tobacco: Never Used  Substance and Sexual Activity  . Alcohol use: No  .  Drug use: No  . Sexual activity: Not on file  Other Topics Concern  . Not on file  Social History Narrative   Does not have  Living Will   Desires CPR, would not want prolonged life support if futile.     Review of Systems: General: negative for chills, fever, night sweats or weight changes.  Cardiovascular: negative for chest pain, dyspnea on exertion, edema, orthopnea, palpitations, paroxysmal nocturnal dyspnea or shortness of breath Dermatological: negative for rash Respiratory: negative for cough or wheezing Urologic: negative for hematuria Abdominal: negative for nausea, vomiting, diarrhea, bright red blood per rectum, melena, or hematemesis Neurologic: negative for visual changes, syncope, or dizziness All other systems reviewed and are otherwise negative except as noted above.   Physical Exam:  Blood pressure 124/64, pulse 76, height 5\' 6"  (1.676 m), weight 190 lb (86.2 kg).  General appearance: alert, cooperative and no distress Neck: no carotid bruit and no JVD Lungs: clear to auscultation bilaterally Heart: regular rate and rhythm and 3/6 murmur at RUSB  Extremities: extremities normal, atraumatic, no cyanosis or edema Pulses: 2+ and symmetric Skin: Skin color, texture, turgor normal. No rashes or lesions Neurologic: Grossly normal  EKG NSR-- personally reviewed   ASSESSMENT AND PLAN:   Given the patient's symptoms, we will obtain a 48-hour outpatient monitor to monitor heart rate and assess for cardiac arrhythmias.  Will also check TSH, CBC and comprehensive metabolic panel. We will repeat echocardiogram given her recent symptoms and presence of a  3/6 murmur auscultated on exam today. She has h/o AVR. Will check echo to ensure normal valve function. F/u in 1-2 weeks after monitor and echo.    Lavar Rosenzweig Ladoris Gene, MHS [facility] 07/04/2017 4:44 PM

## 2017-07-04 NOTE — Patient Instructions (Addendum)
Medication Instructions:   Your physician recommends that you continue on your current medications as directed. Please refer to the Current Medication list given to you today.   If you need a refill on your cardiac medications before your next appointment, please call your pharmacy.  Labwork:  CBC CMP AND TSH   TODAY    Testing/Procedures: Your physician has requested that you have an echocardiogram. Echocardiography is a painless test that uses sound waves to create images of your heart. It provides your doctor with information about the size and shape of your heart and how well your heart's chambers and valves are working. This procedure takes approximately one hour. There are no restrictions for this procedure.  Your physician has recommended that you wear a holter monitor. Holter monitors are medical devices that record the heart's electrical activity. Doctors most often use these monitors to diagnose arrhythmias. Arrhythmias are problems with the speed or rhythm of the heartbeat. The monitor is a small, portable device. You can wear one while you do your normal daily activities. This is usually used to diagnose what is causing palpitations/syncope (passing out).     Follow-Up: IN 1 TO 2 WEEK WITH [facility] OR AVIALABLE APP ON [place] TEAM      Any Other Special Instructions Will Be Listed Below (If Applicable).                                                                                                                                                  '

## 2017-07-05 ENCOUNTER — Telehealth: Payer: Self-pay | Admitting: Cardiology

## 2017-07-05 ENCOUNTER — Other Ambulatory Visit: Payer: Self-pay | Admitting: *Deleted

## 2017-07-05 DIAGNOSIS — E039 Hypothyroidism, unspecified: Secondary | ICD-10-CM

## 2017-07-05 NOTE — Telephone Encounter (Signed)
New message     Patient spouse returning call for lab results. Please call

## 2017-07-08 ENCOUNTER — Other Ambulatory Visit: Payer: Medicare HMO | Admitting: *Deleted

## 2017-07-08 DIAGNOSIS — E039 Hypothyroidism, unspecified: Secondary | ICD-10-CM

## 2017-07-09 LAB — T4, FREE: Free T4: 1.39 ng/dL (ref 0.82–1.77)

## 2017-07-09 LAB — TSH: TSH: 0.133 u[IU]/mL — ABNORMAL LOW (ref 0.450–4.500)

## 2017-07-09 LAB — T3, FREE: T3, Free: 2.5 pg/mL (ref 2.0–4.4)

## 2017-07-24 ENCOUNTER — Ambulatory Visit: Payer: Medicare HMO | Admitting: Cardiology

## 2017-07-25 ENCOUNTER — Telehealth: Payer: Self-pay | Admitting: Cardiology

## 2017-07-25 ENCOUNTER — Other Ambulatory Visit: Payer: Self-pay | Admitting: *Deleted

## 2017-07-25 MED ORDER — NITROGLYCERIN 0.4 MG SL SUBL: 0.4000 mg | SUBLINGUAL_TABLET | SUBLINGUAL | 1 refills | Status: DC | PRN

## 2017-07-25 NOTE — Telephone Encounter (Signed)
New message    *STAT* If patient is at the pharmacy, call can be transferred to refill team.   1. Which medications need to be refilled? (please list name of each medication and dose if known) nitroGLYCERIN (NITROSTAT) 0.4 MG SL tablet  2. Which pharmacy/location (including street and city if local pharmacy) is medication to be sent to? [facility], [place]  3. Do they need a 30 day or 90 day supply?

## 2017-08-05 ENCOUNTER — Telehealth: Payer: Self-pay | Admitting: Cardiovascular Disease

## 2017-08-05 ENCOUNTER — Other Ambulatory Visit: Payer: Self-pay

## 2017-08-05 ENCOUNTER — Emergency Department (HOSPITAL_COMMUNITY): Payer: Medicare HMO

## 2017-08-05 ENCOUNTER — Encounter (HOSPITAL_COMMUNITY): Payer: Self-pay | Admitting: Emergency Medicine

## 2017-08-05 DIAGNOSIS — E119 Type 2 diabetes mellitus without complications: Secondary | ICD-10-CM | POA: Insufficient documentation

## 2017-08-05 DIAGNOSIS — E039 Hypothyroidism, unspecified: Secondary | ICD-10-CM | POA: Insufficient documentation

## 2017-08-05 DIAGNOSIS — I110 Hypertensive heart disease with heart failure: Secondary | ICD-10-CM | POA: Diagnosis not present

## 2017-08-05 DIAGNOSIS — Z955 Presence of coronary angioplasty implant and graft: Secondary | ICD-10-CM | POA: Diagnosis not present

## 2017-08-05 DIAGNOSIS — I2510 Atherosclerotic heart disease of native coronary artery without angina pectoris: Secondary | ICD-10-CM | POA: Insufficient documentation

## 2017-08-05 DIAGNOSIS — Z951 Presence of aortocoronary bypass graft: Secondary | ICD-10-CM | POA: Diagnosis not present

## 2017-08-05 DIAGNOSIS — J45909 Unspecified asthma, uncomplicated: Secondary | ICD-10-CM | POA: Diagnosis not present

## 2017-08-05 DIAGNOSIS — Z7982 Long term (current) use of aspirin: Secondary | ICD-10-CM | POA: Insufficient documentation

## 2017-08-05 DIAGNOSIS — J449 Chronic obstructive pulmonary disease, unspecified: Secondary | ICD-10-CM | POA: Insufficient documentation

## 2017-08-05 DIAGNOSIS — I5031 Acute diastolic (congestive) heart failure: Secondary | ICD-10-CM | POA: Insufficient documentation

## 2017-08-05 DIAGNOSIS — Z87891 Personal history of nicotine dependence: Secondary | ICD-10-CM | POA: Diagnosis not present

## 2017-08-05 DIAGNOSIS — R002 Palpitations: Secondary | ICD-10-CM | POA: Diagnosis present

## 2017-08-05 DIAGNOSIS — Z91040 Latex allergy status: Secondary | ICD-10-CM | POA: Diagnosis not present

## 2017-08-05 DIAGNOSIS — Z794 Long term (current) use of insulin: Secondary | ICD-10-CM | POA: Insufficient documentation

## 2017-08-05 LAB — CBC WITH DIFFERENTIAL/PLATELET
BASOS ABS: 0 10*3/uL (ref 0.0–0.1)
Basophils Relative: 0 %
EOS PCT: 2 %
Eosinophils Absolute: 0.2 10*3/uL (ref 0.0–0.7)
HCT: 34.7 % — ABNORMAL LOW (ref 36.0–46.0)
Hemoglobin: 10.7 g/dL — ABNORMAL LOW (ref 12.0–15.0)
LYMPHS ABS: 2.3 10*3/uL (ref 0.7–4.0)
LYMPHS PCT: 22 %
MCH: 23.7 pg — AB (ref 26.0–34.0)
MCHC: 30.8 g/dL (ref 30.0–36.0)
MCV: 76.8 fL — AB (ref 78.0–100.0)
MONO ABS: 0.6 10*3/uL (ref 0.1–1.0)
Monocytes Relative: 6 %
Neutro Abs: 7.3 10*3/uL (ref 1.7–7.7)
Neutrophils Relative %: 70 %
PLATELETS: 209 10*3/uL (ref 150–400)
RBC: 4.52 MIL/uL (ref 3.87–5.11)
RDW: 15.7 % — AB (ref 11.5–15.5)
WBC: 10.5 10*3/uL (ref 4.0–10.5)

## 2017-08-05 LAB — COMPREHENSIVE METABOLIC PANEL
ALT: 12 U/L — ABNORMAL LOW (ref 14–54)
AST: 17 U/L (ref 15–41)
Albumin: 3.1 g/dL — ABNORMAL LOW (ref 3.5–5.0)
Alkaline Phosphatase: 125 U/L (ref 38–126)
Anion gap: 12 (ref 5–15)
BUN: 15 mg/dL (ref 6–20)
CHLORIDE: 101 mmol/L (ref 101–111)
CO2: 22 mmol/L (ref 22–32)
CREATININE: 0.99 mg/dL (ref 0.44–1.00)
Calcium: 8.5 mg/dL — ABNORMAL LOW (ref 8.9–10.3)
GFR, EST NON AFRICAN AMERICAN: 56 mL/min — AB (ref >60)
Glucose, Bld: 267 mg/dL — ABNORMAL HIGH (ref 65–99)
POTASSIUM: 4 mmol/L (ref 3.5–5.1)
Sodium: 135 mmol/L (ref 135–145)
Total Bilirubin: 0.3 mg/dL (ref 0.3–1.2)
Total Protein: 6.4 g/dL — ABNORMAL LOW (ref 6.5–8.1)

## 2017-08-05 LAB — I-STAT TROPONIN, ED: Troponin i, poc: 0 ng/mL (ref 0.00–0.08)

## 2017-08-05 NOTE — Telephone Encounter (Signed)
Spoke with patient who states she is having intermittent feelings of "fluttering in chest" and feeling that her HR is irregular. She states she was sitting at the computer playing a game when she noticed that she felt funny and her HR felt fast. She checked herself with her stethoscope and pulse oximeter and she felt and heard skipped heart beats. I asked if it felt the same as what we noted on recent cardiac monitor which was occasional PACs and PVCs and she stated that she wasn't sure it just concerns her . I asked about caffeine consumption and she denies. She states her HR goes from 70 to 100 bpm when she gets up and walks around. I asked if it slows down when she rests and she confirms. I advised this is normal. She c/o SOB when the HR is elevated. I asked about cancelled follow-up appointment on 2/20 and she states they felt like there was no reason to come in with the test results that had been reported but she admits that these symptoms have been occurring intermittently x several weeks. She denies chest pressure or worsening symptoms with exertion and denies use of NTG recently. I advised that I will send message to Dr. Angelena Form for advice prior to her appointment with him on 4/8.

## 2017-08-05 NOTE — Telephone Encounter (Signed)
Patient c/o Palpitations:  High priority if patient c/o lightheadedness, shortness of breath, or chest pain  1) How long have you had palpitations/irregular HR/ Afib? Are you having the symptoms now? For about about a little over a week   2) Are you currently experiencing lightheadedness, SOB or CP? CP every once in a while   3) Do you have a history of afib (atrial fibrillation) or irregular heart rhythm? no  4) Have you checked your BP or HR? (document readings if available): 86  5) Are you experiencing any other symptoms? CP every once in a while

## 2017-08-05 NOTE — ED Triage Notes (Signed)
Pt st's while sitting at her computer today she noticed her heart was racing.  St's it would go up to 120 then slow down.  On arrival to triage pt very anxious and hyperventilating.

## 2017-08-05 NOTE — ED Provider Notes (Signed)
Patient placed in Quick Look pathway, seen and evaluated   Chief Complaint: Chest pain and tachycardia  HPI:   Patient states she started having tachycardia and chest discomfort earlier this afternoon.  Patient states she was at home and she would check her heartbeat with a  ROS: Palpitations tachycardia and chest discomfort (one)  Physical Exam:   Gen: No distress  Neuro: Awake and Alert  Skin: Warm    Focused Exam: Heart is regular rate and rhythm no murmurs gallops or rubs.  Lungs are clear to auscultation   Initiation of care has begun. The patient has been counseled on the process, plan, and necessity for staying for the completion/evaluation, and the remainder of the medical screening examination    Dalia Heading, PA-C 08/05/17 [place], [place], DO 08/05/17 2000

## 2017-08-06 ENCOUNTER — Emergency Department (HOSPITAL_COMMUNITY)
Admission: EM | Admit: 2017-08-06 | Discharge: 2017-08-06 | Disposition: A | Discharge status: Home / Self Care | Payer: Medicare HMO | Attending: Emergency Medicine | Admitting: Emergency Medicine

## 2017-08-06 DIAGNOSIS — R002 Palpitations: Secondary | ICD-10-CM

## 2017-08-06 NOTE — Discharge Instructions (Signed)
Follow-up with your cardiologist tomorrow to arrange prompt follow-up.

## 2017-08-06 NOTE — ED Provider Notes (Signed)
[facility] Provider Note   CSN: [id number] Arrival date & time: 08/05/17  1918     History   Chief Complaint Chief Complaint  Patient presents with  . Irregular Heart Beat    HPI [name] is a 72 y.o. female.  Patient is a 72 year old female with past medical history of coronary artery disease with prior CABG and valve replacement, diabetes, COPD.  She presents today for evaluation of palpitations.  She reports intermittent heart racing throughout the evening.  She states that on occasion she will feel her heart speed up into the 120s for several seconds, then resume its normal rate.  She has been experiencing these episodes for several months.  At one time, she was placed in an event monitor, however this only showed PACs and PVCs, but no sustained arrhythmia.  She denies to me that she is experiencing any chest pain or shortness of breath.  She just feels a sensation of fluttering when her heart speeds up.   The history is provided by the patient.  Palpitations   This is a recurrent problem. The current episode started yesterday. Episode frequency: Intermittently. The problem has been resolved. The problem is associated with an unknown factor. Pertinent negatives include no fever, no chest pain, no syncope, no weakness and no shortness of breath. She has tried nothing for the symptoms. The treatment provided no relief.    Past Medical History:  Diagnosis Date  . 1st degree AV block   . ACE-inhibitor cough   . Allergic rhinitis   . Anemia    iron deficiency anemia  . Anxiety   . Aortic ectasia (HCC)    a. CT abd in 12/2016 incidentally noted aortic atherosclerosis and infrarenal abdominal aortic ectasia measuring as large as 2.7 cm with recommendation to repeat US in 2023.  Marland Kitchen Arthritis   . Asthma   . Cataract   . Chronic depression   . Chronic diastolic CHF (congestive heart failure) ([place])   . Chronic headache   . COPD (chronic  obstructive pulmonary disease) ([place])   . Coronary artery disease    a. DES to RCA and mid Cx 2009. b. CABG and bioprosthetic AVR May 2015. c. cutting balloon to prox Cx in 05/2016  . Diabetes mellitus    type 2  . Diverticulitis of colon   . Essential hypertension   . GERD (gastroesophageal reflux disease)   . Hearing loss   . History of blood transfusion 2013  . History of prosthetic aortic valve replacement   . HOH (hard of hearing)   . Hypercholesterolemia    intolerance of statins and niaspan  . Mobitz type 1 second degree AV block   . OSA (obstructive sleep apnea)    mild, intolerant of cpap  . PAD (peripheral artery disease) ([place])    a. atherosclerosis by CT abd 12/2016 in [place].  Marland Kitchen PONV (postoperative nausea and vomiting)   . Statin intolerance   . Thyroid disease     Patient Active Problem List   Diagnosis Date Noted  . Depression, major, single episode, complete remission ([place]) 04/11/2017  . 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 Aortic ectasia, abdominal ([place]) 01/01/2017  . GAD (generalized anxiety disorder) 09/26/2016  . Acute posthemorrhagic anemia 08/24/2016  . Acute respiratory failure with hypoxia ([place]) 08/24/2016  . Acute diastolic CHF (congestive heart failure) ([place]) 08/24/2016  . Hyponatremia 08/24/2016  . Leukocytosis 08/24/2016  . Fever 08/24/2016  . Diarrhea 08/24/2016  .  Generalized weakness 08/24/2016  . Acute diverticulitis 08/16/2016  . Lower GI bleed   . Ataxia 08/09/2016  . Dizziness 08/09/2016  . Staggering gait 08/09/2016  . NSTEMI (non-ST elevated myocardial infarction) ([place])   . AVB (atrioventricular block)   . Atypical chest pain   . Unstable angina ([place]) 05/25/2016  . Palpitations 05/25/2016  . PUD - recent Rx for H.Pylori 05/25/2016  . Stenosis of coronary artery stent   . Recurrent major depressive disorder, in partial remission ([place]) 07/29/2015  . Parotiditis 04/25/2015  . Viral URI 03/24/2015  . Hypoglycemia 08/25/2014  .  Nausea without vomiting 08/25/2014  . Dyslipidemia-statin ontol   . Long-term insulin use ([place]) 03/31/2014  . Aortic valve disorder 11/11/2013  . 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 Coronary Artery Disease s/p CABG 10/2013 10/10/2013  . Other malaise and fatigue 09/11/2013  . Alopecia 03/06/2013  . Insomnia 02/06/2013  . Gastroesophageal reflux disease without esophagitis 01/19/2013  . Dysphagia, unspecified(787.20) 01/19/2013  . Dyspnea 11/17/2012  . Weakness 11/17/2012  . Abnormal MRI of head 09/05/2012  . Depression with anxiety 03/21/2012  . Extrinsic asthma 11/12/2011  . Anemia 06/07/2011  . Chronic obstructive pulmonary disease ([place]) 01/25/2010  . Acquired hypothyroidism 08/10/2009  . MICROSCOPIC HEMATURIA 05/07/2009  . GOITER 03/02/2009  . Essential hypertension 01/06/2009  . MEMORY LOSS 06/23/2008  . Obstructive sleep apnea 12/25/2007  . Insulin dependent diabetes mellitus ([place]) 09/09/2006  . ALLERGIC RHINITIS 09/09/2006  . DIVERTICULOSIS, COLON 09/09/2006    Past Surgical History:  Procedure Laterality Date  . ABDOMINAL HYSTERECTOMY    . ABDOMINAL HYSTERECTOMY W/ PARTIAL VAGINACTOMY    . AORTIC VALVE REPLACEMENT N/A 10/12/2013   Procedure: AORTIC VALVE REPLACEMENT (AVR);  Surgeon: [name], MD;  Location: [facility];  Service: Open Heart Surgery;  Laterality: N/A;  . [place]  . BARTHOLIN GLAND CYST EXCISION    . BLADDER SUSPENSION    . BREAST BIOPSY Bilateral 09/11/2000   neg  . BREAST BIOPSY Left 07/24/2010   neg  . BREAST CYST EXCISION  1988   bilateral nonmalignant tumors, x3  . CARDIAC CATHETERIZATION    . CARDIAC CATHETERIZATION N/A 05/25/2016   Procedure: Coronary Balloon Angioplasty;  Surgeon: Leonie Man, MD;  Location: [facility];  Service: Cardiovascular;  Laterality: N/A;  . CARDIAC CATHETERIZATION N/A 05/25/2016   Procedure: Coronary/Graft Angiography;  Surgeon: Leonie Man, MD;  Location: [facility];  Service:  Cardiovascular;  Laterality: N/A;  . CATARACT EXTRACTION W/ INTRAOCULAR LENS  IMPLANT, BILATERAL    . CHOLECYSTECTOMY  2001  . COLECTOMY     lap sigmoid  . COLONOSCOPY  2014   polyps found, 2 clamped off.  . CORONARY ANGIOPLASTY  10/29/2007   Prox RCA & Mid Cx.  . CORONARY ARTERY BYPASS GRAFT N/A 10/12/2013   Procedure: CORONARY ARTERY BYPASS GRAFT TIMES TWO;  Surgeon: [name], MD;  Location: [facility];  Service: Open Heart Surgery;  Laterality: N/A;  . LEFT HEART CATHETERIZATION WITH CORONARY ANGIOGRAM N/A 10/09/2013   Procedure: LEFT HEART CATHETERIZATION WITH CORONARY ANGIOGRAM;  Surgeon: [name], MD;  Location[facility] CATH LAB;  Service: Cardiovascular;  Laterality: N/A;  . STERNAL WIRES REMOVAL N/A 04/13/2014   Procedure: STERNAL WIRES REMOVAL;  Surgeon: [name], MD;  Location: [facility];  Service: Thoracic;  Laterality: N/A;  . THYROIDECTOMY    . TONSILLECTOMY    . TUBAL LIGATION    . VAGINAL DELIVERY  3  . VISCERAL ARTERY INTERVENTION N/A 08/16/2016   Procedure: Visceral Artery Intervention;  Surgeon: Algernon Huxley, MD;  Location: [facility];  Service: Cardiovascular;  Laterality: N/A;    OB History    Gravida Para Term Preterm AB Living   3 3       3    SAB TAB Ectopic Multiple Live Births                  Obstetric Comments   1st Menstrual Cycle: 10 1st Pregnancy: 19       Home Medications    Prior to Admission medications   Medication Sig Start Date End Date Taking? Authorizing Provider  albuterol (PROAIR HFA) 108 (90 Base) MCG/ACT inhaler Inhale 2 puffs into the lungs every 4 (four) hours as needed for wheezing or shortness of breath. 07/17/16   Flora Lipps, MD  amLODipine (NORVASC) 2.5 MG tablet Take 2.5 mg by mouth daily.    [provider]  aspirin EC 81 MG tablet Take 1 tablet (81 mg total) by mouth daily. 01/28/17   Dunn, Nedra Hai, PA-C  Calcium-Vitamin D (CALTRATE 600 PLUS-VIT D PO) Take 2 tablets by mouth 2 (two) times daily.      [provider]  FLUoxetine (PROZAC) 40 MG capsule Take 40 mg by mouth 2 (two) times daily. 07/29/15   [provider]  insulin NPH-regular Human (NOVOLIN 70/30) (70-30) 100 UNIT/ML injection Inject 70 Units into the skin 2 (two) times daily.    [provider]  levothyroxine (SYNTHROID, LEVOTHROID) 137 MCG tablet Take by mouth. 04/03/17   [provider]  nitroGLYCERIN (NITROSTAT) 0.4 MG SL tablet Place 1 tablet (0.4 mg total) under the tongue every 5 (five) minutes as needed for chest pain. 07/25/17   Lyda Jester M, PA-C  ondansetron (ZOFRAN) 4 MG tablet Take 1 tablet (4 mg total) by mouth every 6 (six) hours as needed for nausea. 08/24/16   Theodoro Grist, MD  [facility] DELICA LANCETS 82N MISC USE TO CHECK BLOOD SUGAR THREE TIMES DAILY 05/17/14   [provider]  pantoprazole (PROTONIX) 20 MG tablet Take 20 mg by mouth daily. 01/23/17   [provider]    Family History Family History  Problem Relation Age of Onset  . Breast cancer Mother 26  . Hypertension Father   . Mesothelioma Father   . Asthma Father   . Stroke Paternal Grandfather   . Heart disease Unknown   . Breast cancer Maternal Aunt   . Breast cancer Paternal Aunt     Social History Social History   Tobacco Use  . Smoking status: Former Smoker    Packs/day: 0.50    Years: 30.00    Pack years: 15.00    Types: Cigarettes    Last attempt to quit: 10/02/2013    Years since quitting: 3.8  . Smokeless tobacco: Never Used  Substance Use Topics  . Alcohol use: No  . Drug use: No     Allergies   Amitriptyline; Benadryl [diphenhydramine]; Demerol [meperidine]; Gabapentin; Loratadine; Meperidine hcl; Mirtazapine; Olanzapine; Voltaren [diclofenac sodium]; Zetia [ezetimibe]; Ativan [lorazepam]; Atorvastatin; Budesonide-formoterol fumarate; Bupropion hcl; Caffeine; Codeine sulfate; Lisinopril; Metformin; Mometasone furoate; Morphine sulfate; Other;  Oxycodone-acetaminophen; Pioglitazone; Propoxyphene n-acetaminophen; Rosuvastatin; Shellfish allergy; Ticagrelor; Tramadol; Trazodone and nefazodone; Venlafaxine; Zolpidem tartrate; and Latex   Review of Systems Review of Systems  Constitutional: Negative for fever.  Respiratory: Negative for shortness of breath.   Cardiovascular: Positive for palpitations. Negative for chest pain and  syncope.  Neurological: Negative for weakness.  All other systems reviewed and are negative.    Physical Exam Updated Vital Signs BP (!) 151/70   Pulse 80   Temp 99 F (37.2 C) (Oral)   Resp 20   Ht 5\' 6"  (1.676 m)   Wt 86.2 kg (190 lb)   SpO2 98%   BMI 30.67 kg/m   Physical Exam  Constitutional: She is oriented to person, place, and time. She appears well-developed and well-nourished. No distress.  HENT:  Head: Normocephalic and atraumatic.  Neck: Normal range of motion. Neck supple.  Cardiovascular: Normal rate and regular rhythm. Exam reveals no gallop and no friction rub.  No murmur heard. Pulmonary/Chest: Effort normal and breath sounds normal. No respiratory distress. She has no wheezes.  Abdominal: Soft. Bowel sounds are normal. She exhibits no distension. There is no tenderness.  Musculoskeletal: Normal range of motion. She exhibits no edema or tenderness.  Neurological: She is alert and oriented to person, place, and time.  Skin: Skin is warm and dry. She is not diaphoretic.  Nursing note and vitals reviewed.    ED Treatments / Results  Labs (all labs ordered are listed, but only abnormal results are displayed) Labs Reviewed  CBC WITH DIFFERENTIAL/PLATELET - Abnormal; Notable for the following components:      Result Value   Hemoglobin 10.7 (*)    HCT 34.7 (*)    MCV 76.8 (*)    MCH 23.7 (*)    RDW 15.7 (*)    All other components within normal limits  COMPREHENSIVE METABOLIC PANEL - Abnormal; Notable for the following components:   Glucose, Bld 267 (*)    Calcium 8.5 (*)     Total Protein 6.4 (*)    Albumin 3.1 (*)    ALT 12 (*)    GFR calc non Af Amer 56 (*)    All other components within normal limits  I-STAT TROPONIN, ED    EKG  EKG Interpretation  Date/Time:  Monday August 05 2017 19:24:11 EST Ventricular Rate:  93 PR Interval:  204 QRS Duration: 98 QT Interval:  390 QTC Calculation: 484 R Axis:   18 Text Interpretation:  Normal sinus rhythm First Degree AV Block Septal infarct , age undetermined Abnormal ECG Confirmed by Veryl Speak [phone]) on 08/06/2017 12:16:30 AM       Radiology Dg Chest 2 View  Result Date: 08/05/2017 CLINICAL DATA:  Dyspnea with episodic chest pain today EXAM: CHEST  2 VIEW COMPARISON:  08/23/2016 Stable cardiomegaly. Median sternotomy sutures and surgical clips project over the cardiac mediastinal contours from prior CABG and aortic valve replacement. Lungs are clear. Resolution of posterior pleural effusions since prior exam. No acute osseous abnormality. IMPRESSION: Stable cardiomegaly with post CABG and aortic valvular replacement. No active pulmonary disease. Electronically Signed   By: Ashley Royalty M.D.   On: 08/05/2017 20:36    Procedures Procedures (including critical care time)  Medications Ordered in ED Medications - No data to display   Initial Impression / Assessment and Plan / ED Course  I have reviewed the triage vital signs and the nursing notes.  Pertinent labs & imaging results that were available during my care of the patient were reviewed by me and considered in my medical decision making (see chart for details).  Patient's EKG reveals a normal sinus rhythm with no significant change from prior studies.  Her laboratory studies are unremarkable including electrolytes and troponin.  She denies to me that she is  experiencing any discomfort, just palpitations.  She has had no further ectopy since the cardiac monitor was applied.  Throughout my exam, she has maintained a sinus rhythm in the upper  70s/low 80s.  She did speak with the cardiology clinic yesterday.  She is awaiting response from Dr. Julianne Handler.   I see no indication for admission.  My advice to her is to call her cardiologist in the morning to ensure prompt follow-up.  Final Clinical Impressions(s) / ED Diagnoses   Final diagnoses:  None    ED Discharge Orders    None       Veryl Speak, MD 08/06/17 [phone]

## 2017-08-06 NOTE — Telephone Encounter (Signed)
Follow up      Patient was expecting a call back and has not heard anything, would like to know how to proceed with care for the fluttering in chest

## 2017-08-06 NOTE — Telephone Encounter (Signed)
Advised pt we would call once Dr. Angelena Form has reviewed and advised

## 2017-08-07 NOTE — Telephone Encounter (Signed)
Spoke with the pt and spouse and endorsed recommendations per Dr Angelena Form.  Advised the pt to avoid stimulants/caffeine, and no med changes to be made.  Both verbalized understanding and agrees with this plan.

## 2017-08-07 NOTE — Telephone Encounter (Signed)
Based on he recent heart monitor from last month, this is most likely early beats. I would make no medication changes. I would not consider a beta blocker or calcium channel blocker given her history of bradycardia. Avoid stimulants/caffeine. No other changes recommended.   [name]

## 2017-09-09 ENCOUNTER — Ambulatory Visit: Payer: Medicare HMO | Admitting: Cardiovascular Disease

## 2017-09-18 NOTE — Progress Notes (Signed)
Chief Complaint  Patient presents with  . Coronary Artery Disease     History of Present Illness: 72 yo female with history of anemia, HTN, HLD, DM, COPD, CAD s/p prior stent placements followed by CABG and AVR May 2015 who is here today for cardiac follow up. She had a DES placed in the RCA and mid Circumflex in May 2009. She was admitted 10/2013 at [facility] with chest pain. Echo demonstrated normal LVEF, mod AI and mild AS. Myoview was negative for ischemia. She followed up with Dr. Ida Rogue in [place] with continued symptoms and was set up for [facility]. This demonstrated 3v CAD with severe prox LAD stenosis and ostial RCA. She was referred for CABG. Intraoperative TEE demonstrated mild AS and mod to severe AI. She underwent CABG with Dr. Cyndia Bent 10/12/13 (L-LAD, S-RCA) + AVR with a bioprosthetic valve (21 mm [employer]). Echo June 2015 with normal LVEF and normal AVR. She was seen in our office 12/03/13 by Richardson Dopp, PA-C and still having chest pain. Troponin was negative. D-dimer was elevated. Pt was sent to [facility] for CTA chest to exclude PE that evening. NO PE on chest CT but noted to have small left pleural effusion. Admitted to [facility] 07/26/14 with chest pain. Stress myoview February 2016 with no ischemia. She does not tolerate statins or Zetia. Seen in lipid clinic Feb 2016 and tried on low dose Pravachol but did not tolerate. She was admitted to [facility] December 2017 with unstable angina. Cardiac cath with patent LIMA to LAD and SVG to RCA but progression of stent restenosis in the proximal circumflex which was treated with cutting balloon angioplasty. She had Second degree AV block noted in the hospital. She did not tolerate Brilinta and was placed on Effient. She had continued dizziness post discharge and cardiac monitor showed sinus with 1st degree AV block, second degree AV block without diary notation of symptoms during these events. She was seen by Dr. Caryl Comes who did not feel that her  bradycardia and heart block was related to her dizziness. He suggested that her dyspnea may be related to chronotropic incompetence and suggested stress testing. Exercise stress test on 07/27/16 with 1 minute 36 seconds of exercise stopped due to dizziness. Heart rate responded normally to exercise. Her trazodone was stopped and her dizziness resolved. Effient was stopped in September 2018 due to GI bleeding/anemia. She was seen in our office in January 2019 with c/o rapid heart rate with minimal exercise. 48 hour cardiac monitor showed sinus rhythm with bradycardia, rates as low as 52 bpm. Rare PACs, Rare PVCs. Echo January 2019 with LVEF=60-65%. AVR working well but the gradients across the valve were increased from the prior study.   She is here today for follow up. She is feeling terrible. She has chest pain/pressure when walking across the room. She has severe dyspnea with minimal exertion. This feels like her prior episodes of unstable angina. She is asking to be admitted today. She is feeling dizzy. She has had fevers for the past 2 weeks and has finished a round of steroids and amoxicillin. She is felt to have a viral syndrome. No LE edema. Her weight has been stable at home. She has no energy. Her heart has been racing at home. Pulse at rest jumps from 70 to 110.   Primary Care Physician: Glendon Axe, MD   Past Medical History:  Diagnosis Date  . 1st degree AV block   . ACE-inhibitor cough   .  Allergic rhinitis   . Anemia    iron deficiency anemia  . Anxiety   . Aortic ectasia (HCC)    a. CT abd in 12/2016 incidentally noted aortic atherosclerosis and infrarenal abdominal aortic ectasia measuring as large as 2.7 cm with recommendation to repeat US in 2023.  Marland Kitchen Arthritis   . Asthma   . Cataract   . Chronic depression   . Chronic diastolic CHF (congestive heart failure) ([place])   . Chronic headache   . COPD (chronic obstructive pulmonary disease) ([place])   . Coronary artery disease    a.  DES to RCA and mid Cx 2009. b. CABG and bioprosthetic AVR May 2015. c. cutting balloon to prox Cx in 05/2016  . Diabetes mellitus    type 2  . Diverticulitis of colon   . Essential hypertension   . GERD (gastroesophageal reflux disease)   . Hearing loss   . History of blood transfusion 2013  . History of prosthetic aortic valve replacement   . HOH (hard of hearing)   . Hypercholesterolemia    intolerance of statins and niaspan  . Mobitz type 1 second degree AV block   . OSA (obstructive sleep apnea)    mild, intolerant of cpap  . PAD (peripheral artery disease) ([place])    a. atherosclerosis by CT abd 12/2016 in [place].  Marland Kitchen PONV (postoperative nausea and vomiting)   . Statin intolerance   . Thyroid disease     Past Surgical History:  Procedure Laterality Date  . ABDOMINAL HYSTERECTOMY    . ABDOMINAL HYSTERECTOMY W/ PARTIAL VAGINACTOMY    . AORTIC VALVE REPLACEMENT N/A 10/12/2013   Procedure: AORTIC VALVE REPLACEMENT (AVR);  Surgeon: [name], MD;  Location: [place];  Service: Open Heart Surgery;  Laterality: N/A;  . [place]  . BARTHOLIN GLAND CYST EXCISION    . BLADDER SUSPENSION    . BREAST BIOPSY Bilateral 09/11/2000   neg  . BREAST BIOPSY Left 07/24/2010   neg  . BREAST CYST EXCISION  1988   bilateral nonmalignant tumors, x3  . CARDIAC CATHETERIZATION    . CARDIAC CATHETERIZATION N/A 05/25/2016   Procedure: Coronary Balloon Angioplasty;  Surgeon: Leonie Man, MD;  Location: [place] [facility];  Service: Cardiovascular;  Laterality: N/A;  . CARDIAC CATHETERIZATION N/A 05/25/2016   Procedure: Coronary/Graft Angiography;  Surgeon: Leonie Man, MD;  Location: [place] [facility];  Service: Cardiovascular;  Laterality: N/A;  . CATARACT EXTRACTION W/ INTRAOCULAR LENS  IMPLANT, BILATERAL    . CHOLECYSTECTOMY  2001  . COLECTOMY     lap sigmoid  . COLONOSCOPY  2014   polyps found, 2 clamped off.  . CORONARY ANGIOPLASTY  10/29/2007   Prox RCA & Mid Cx.  . CORONARY  ARTERY BYPASS GRAFT N/A 10/12/2013   Procedure: CORONARY ARTERY BYPASS GRAFT TIMES TWO;  Surgeon: [name], MD;  Location: [place];  Service: Open Heart Surgery;  Laterality: N/A;  . LEFT HEART CATHETERIZATION WITH CORONARY ANGIOGRAM N/A 10/09/2013   Procedure: LEFT HEART CATHETERIZATION WITH CORONARY ANGIOGRAM;  Surgeon: [name], MD;  Location[facility] CATH LAB;  Service: Cardiovascular;  Laterality: N/A;  . STERNAL WIRES REMOVAL N/A 04/13/2014   Procedure: STERNAL WIRES REMOVAL;  Surgeon: [name], MD;  Location: [facility] OR;  Service: Thoracic;  Laterality: N/A;  . THYROIDECTOMY    . TONSILLECTOMY    . TUBAL LIGATION    . VAGINAL DELIVERY     3  .  VISCERAL ARTERY INTERVENTION N/A 08/16/2016   Procedure: Visceral Artery Intervention;  Surgeon: Algernon Huxley, MD;  Location: [place] [facility];  Service: Cardiovascular;  Laterality: N/A;    Current Outpatient Medications  Medication Sig Dispense Refill  . albuterol (PROAIR HFA) 108 (90 Base) MCG/ACT inhaler Inhale 2 puffs into the lungs every 4 (four) hours as needed for wheezing or shortness of breath. 1 Inhaler 2  . amLODipine (NORVASC) 2.5 MG tablet Take 2.5 mg by mouth daily.    Marland Kitchen aspirin EC 81 MG tablet Take 1 tablet (81 mg total) by mouth daily. 90 tablet 3  . Calcium-Vitamin D (CALTRATE 600 PLUS-VIT D PO) Take 2 tablets by mouth 2 (two) times daily.     Marland Kitchen FLUoxetine (PROZAC) 40 MG capsule Take 40 mg by mouth 2 (two) times daily.    . insulin NPH-regular Human (NOVOLIN 70/30) (70-30) 100 UNIT/ML injection Inject 70 Units into the skin 2 (two) times daily.    Marland Kitchen levothyroxine (SYNTHROID, LEVOTHROID) 137 MCG tablet Take by mouth.    . nitroGLYCERIN (NITROSTAT) 0.4 MG SL tablet Place 1 tablet (0.4 mg total) under the tongue every 5 (five) minutes as needed for chest pain. 25 tablet 1  . ondansetron (ZOFRAN) 4 MG tablet Take 1 tablet (4 mg total) by mouth every 6 (six) hours as needed for nausea. 20 tablet 0  . ONETOUCH DELICA  LANCETS 94T MISC USE TO CHECK BLOOD SUGAR THREE TIMES DAILY    . pantoprazole (PROTONIX) 20 MG tablet Take 20 mg by mouth daily.     No current facility-administered medications for this visit.     Allergies  Allergen Reactions  . Amitriptyline Other (See Comments)    Unknown reaction  . Benadryl [Diphenhydramine] Shortness Of Breath  . Demerol [Meperidine] Other (See Comments)    Unknown reaction  . Gabapentin Other (See Comments)    Unknown reaction  . Loratadine Other (See Comments)    Unknown reaction  . Meperidine Hcl Other (See Comments)    Unknown reaction  . Mirtazapine Other (See Comments)    Unknown reaction  . Olanzapine Other (See Comments)    Unknown reaction   . Voltaren [Diclofenac Sodium] Shortness Of Breath  . Zetia [Ezetimibe] Other (See Comments)    Weakness in legs, shakiness all over  . Ativan [Lorazepam] Other (See Comments)    Causes double vision at highter than .5 mg dose  . Atorvastatin Other (See Comments)    Muscle aches and weakness  . Budesonide-Formoterol Fumarate Other (See Comments)    Shakiness, tremors  . Bupropion Hcl Other (See Comments)    "cloud over me" depression  . Caffeine Other (See Comments)    jitters  . Codeine Sulfate Other (See Comments)    Makes chest hurt like a heart attack  . Lisinopril Cough  . Metformin Nausea And Vomiting  . Mometasone Furoate Nausea And Vomiting  . Morphine Sulfate Other (See Comments)    Chest pain like a heart attack  . Other Other (See Comments)    Beta Blockers, reaction shortness of breath  . Oxycodone-Acetaminophen Nausea And Vomiting  . Pioglitazone Other (See Comments)    Cannot take because of risk of bladder cancer  . Propoxyphene N-Acetaminophen Nausea And Vomiting  . Rosuvastatin Other (See Comments)    Muscle aches and weakness  . Shellfish Allergy Diarrhea  . Ticagrelor     Other reaction(s): Other (See Comments) "slowed heart rate" & chest pain  . Tramadol Nausea Only  .  Trazodone And Nefazodone Nausea And Vomiting  . Venlafaxine Other (See Comments)    Unknown reaction  . Zolpidem Tartrate Other (See Comments)     Jittery, diarrhea  . Latex Rash    Social History   Socioeconomic History  . Marital status: Married    Spouse name: Not on file  . Number of children: 3  . Years of education: Not on file  . Highest education level: Not on file  Occupational History  . Occupation: Retired    Fish farm manager: UNEMPLOYED    Comment: CNA  Social Needs  . Financial resource strain: Not on file  . Food insecurity:    Worry: Not on file    Inability: Not on file  . Transportation needs:    Medical: Not on file    Non-medical: Not on file  Tobacco Use  . Smoking status: Former Smoker    Packs/day: 0.50    Years: 30.00    Pack years: 15.00    Types: Cigarettes    Last attempt to quit: 10/02/2013    Years since quitting: 3.9  . Smokeless tobacco: Never Used  Substance and Sexual Activity  . Alcohol use: No  . Drug use: No  . Sexual activity: Not on file  Lifestyle  . Physical activity:    Days per week: Not on file    Minutes per session: Not on file  . Stress: Not on file  Relationships  . Social connections:    Talks on phone: Not on file    Gets together: Not on file    Attends religious service: Not on file    Active member of club or organization: Not on file    Attends meetings of clubs or organizations: Not on file    Relationship status: Not on file  . Intimate partner violence:    Fear of current or ex partner: Not on file    Emotionally abused: Not on file    Physically abused: Not on file    Forced sexual activity: Not on file  Other Topics Concern  . Not on file  Social History Narrative   Does not have Living Will   Desires CPR, would not want prolonged life support if futile.    Family History  Problem Relation Age of Onset  . Breast cancer Mother 12  . Hypertension Father   . Mesothelioma Father   . Asthma Father   .  Stroke Paternal Grandfather   . Heart disease Unknown   . Breast cancer Maternal Aunt   . Breast cancer Paternal Aunt     Review of Systems:  As stated in the HPI and otherwise negative.   BP 126/70   Pulse 71   Ht 5\' 6"  (1.676 m)   Wt 192 lb 9.6 oz (87.4 kg)   SpO2 97%   BMI 31.09 kg/m   Physical Examination:  General: Well developed, well nourished, NAD  HEENT: OP clear, mucus membranes moist  SKIN: warm, dry. No rashes. Neuro: No focal deficits  Musculoskeletal: Muscle strength 5/5 all ext  Psychiatric: Mood and affect normal  Neck: No JVD, no carotid bruits, no thyromegaly, no lymphadenopathy.  Lungs:Clear bilaterally, no wheezes, rhonci, crackles Cardiovascular: Regular rate and rhythm. Loud systolic murmur.  Abdomen:Soft. Bowel sounds present. Non-tender.  Extremities: No lower extremity edema. Pulses are 2 + in the bilateral DP/PT.  Echo 07/04/17: Left ventricle: The cavity size was normal. There was moderate   concentric hypertrophy. Systolic function was normal. The  estimated ejection fraction was in the range of 60% to 65%. Wall   motion was normal; there were no regional wall motion   abnormalities. Doppler parameters are consistent with abnormal   left ventricular relaxation (grade 1 diastolic dysfunction).   Doppler parameters are consistent with elevated ventricular   end-diastolic filling pressure. - Aortic valve: A bioprosthetic valve sits well in the aortic   position. There was no regurgitation. Mean gradient (S): 24 mm   Hg. Peak gradient (S): 44 mm Hg. - Aortic root: The aortic root was normal in size. - Ascending aorta: The ascending aorta was mildly dilated measuring   41 mm. - Mitral valve: Calcified annulus. Moderately thickened, moderately   calcified leaflets . - Left atrium: The atrium was moderately dilated. - Right ventricle: Systolic function was normal. - Tricuspid valve: There was mild regurgitation. - Pulmonary arteries: Systolic  pressure was at the upper limits of   normal. - Inferior vena cava: The vessel was normal in size. - Pericardium, extracardiac: There was no pericardial effusion.  Impressions:  - LVEF 60-65%. Abnormal relaxation and elevated filling pressures.   A bioprosthetic valve sits well in the aortic position.   Transaortic gradients are elevated for this type of prosthesis   and increased since the prior exam on 05/25/2016, now peak/mean   44/24 mmHg, previously 22/11 mmHg.   Cardiac cath 04/25/16: Diagnostic Diagram     Post-Intervention Diagram        EKG:  EKG is ordered today. The ekg ordered today demonstrates sinus rhythm with PACs, rate 82 bpm.   Recent Labs: 07/08/2017: TSH 0.133 08/05/2017: ALT 12; BUN 15; Creatinine, Ser 0.99; Hemoglobin 10.7; Platelets 209; Potassium 4.0; Sodium 135   Lipid Panel    Component Value Date/Time   CHOL 206 (H) 03/16/2015 0759   CHOL 211 (H) 10/03/2013 0515   TRIG 369 (H) 03/16/2015 0759   TRIG 272 (H) 10/03/2013 0515   HDL 21 (L) 03/16/2015 0759   HDL 22 (L) 10/03/2013 0515   CHOLHDL 9.8 (H) 03/16/2015 0759   VLDL 74 (H) 03/16/2015 0759   VLDL 54 (H) 10/03/2013 0515   LDLCALC 111 03/16/2015 0759   LDLCALC 135 (H) 10/03/2013 0515   LDLDIRECT 127.0 07/07/2014 0820     Wt Readings from Last 3 Encounters:  09/19/17 192 lb 9.6 oz (87.4 kg)  08/05/17 190 lb (86.2 kg)  07/04/17 190 lb (86.2 kg)     Other studies Reviewed: Additional studies/ records that were reviewed today include: . Review of the above records demonstrates:    Assessment and Plan:   1. CAD s/p CABG with unstable angina: She is having dyspnea and chest pain c/w unstable angina. Her EKG shows no ischemic changes. She is on ASA. She has not been on a beta blocker due to bradycardia. She has not tolerated statins.  -She will be admitted to [facility] to telemetry. -I will arrange a CBC, CMET, troponin and d-dimer.  -Echo later today to assess her AVR -Chest x-ray - She  will need a cardiac cath tomorrow.     2. Aortic valve disease: She is s/p AVR. The mean gradient across her bioprosthetic AVR increased to 24 mmHg from 11 mmHg over the last year. I will repeat an echo during this admission to evaluate her valve again. This could represent early valve failure.    3. Hypertension: BP is controlled. No changes.   4. Hyperlipidemia: She is statin intolerant (Crestor, Lipitor, Pravastatin). She did not tolerate Zetia.  She was seen in lipid clinic 07/13/14 and tried on low dose Pravastatin but did not tolerate due to muscle aches. We discussed Praluent or Repatha but she did not pursue this. She will need referral to the lipid clinic now to discuss.    She will go home now and will come back to be admitted when a bed is available. She does not wish to go to the ED.   Current medicines are reviewed at length with the patient today.  The patient does not have concerns regarding medicines.  The following changes have been made:  no change  Labs/ tests ordered today include:   Orders Placed This Encounter  Procedures  . EKG 12-Lead    Disposition:   FU with me after discharge.   Signed, [name], MD 09/19/2017 11:08 AM    [facility] [address], [place], [place]  [address] Phone: [phone]; Fax: [phone]

## 2017-09-19 ENCOUNTER — Observation Stay (HOSPITAL_COMMUNITY)
Admission: AD | Admit: 2017-09-19 | Discharge: 2017-09-20 | Disposition: A | Discharge status: Home / Self Care | Payer: Medicare HMO | Source: Ambulatory Visit | Attending: Cardiovascular Disease | Admitting: Cardiovascular Disease

## 2017-09-19 ENCOUNTER — Encounter: Payer: Self-pay | Admitting: Cardiovascular Disease

## 2017-09-19 ENCOUNTER — Ambulatory Visit: Payer: Medicare HMO | Admitting: Cardiovascular Disease

## 2017-09-19 ENCOUNTER — Other Ambulatory Visit: Payer: Self-pay

## 2017-09-19 VITALS — BP 126/70 | HR 71 | Ht 66.0 in | Wt 192.6 lb

## 2017-09-19 DIAGNOSIS — K219 Gastro-esophageal reflux disease without esophagitis: Secondary | ICD-10-CM | POA: Insufficient documentation

## 2017-09-19 DIAGNOSIS — Z87891 Personal history of nicotine dependence: Secondary | ICD-10-CM | POA: Diagnosis not present

## 2017-09-19 DIAGNOSIS — I25110 Atherosclerotic heart disease of native coronary artery with unstable angina pectoris: Secondary | ICD-10-CM

## 2017-09-19 DIAGNOSIS — E079 Disorder of thyroid, unspecified: Secondary | ICD-10-CM | POA: Insufficient documentation

## 2017-09-19 DIAGNOSIS — Y832 Surgical operation with anastomosis, bypass or graft as the cause of abnormal reaction of the patient, or of later complication, without mention of misadventure at the time of the procedure: Secondary | ICD-10-CM | POA: Diagnosis not present

## 2017-09-19 DIAGNOSIS — Z888 Allergy status to other drugs, medicaments and biological substances status: Secondary | ICD-10-CM | POA: Insufficient documentation

## 2017-09-19 DIAGNOSIS — Z951 Presence of aortocoronary bypass graft: Secondary | ICD-10-CM | POA: Insufficient documentation

## 2017-09-19 DIAGNOSIS — Z7982 Long term (current) use of aspirin: Secondary | ICD-10-CM | POA: Insufficient documentation

## 2017-09-19 DIAGNOSIS — T82857A Stenosis of cardiac prosthetic devices, implants and grafts, initial encounter: Secondary | ICD-10-CM | POA: Insufficient documentation

## 2017-09-19 DIAGNOSIS — Z952 Presence of prosthetic heart valve: Secondary | ICD-10-CM | POA: Insufficient documentation

## 2017-09-19 DIAGNOSIS — J449 Chronic obstructive pulmonary disease, unspecified: Secondary | ICD-10-CM | POA: Diagnosis not present

## 2017-09-19 DIAGNOSIS — Z91040 Latex allergy status: Secondary | ICD-10-CM | POA: Diagnosis not present

## 2017-09-19 DIAGNOSIS — Z885 Allergy status to narcotic agent status: Secondary | ICD-10-CM | POA: Insufficient documentation

## 2017-09-19 DIAGNOSIS — Z79890 Hormone replacement therapy (postmenopausal): Secondary | ICD-10-CM | POA: Diagnosis not present

## 2017-09-19 DIAGNOSIS — T82855A Stenosis of coronary artery stent, initial encounter: Secondary | ICD-10-CM | POA: Diagnosis not present

## 2017-09-19 DIAGNOSIS — E119 Type 2 diabetes mellitus without complications: Secondary | ICD-10-CM | POA: Insufficient documentation

## 2017-09-19 DIAGNOSIS — I200 Unstable angina: Secondary | ICD-10-CM | POA: Diagnosis present

## 2017-09-19 DIAGNOSIS — F329 Major depressive disorder, single episode, unspecified: Secondary | ICD-10-CM | POA: Diagnosis not present

## 2017-09-19 DIAGNOSIS — R0600 Dyspnea, unspecified: Secondary | ICD-10-CM | POA: Diagnosis not present

## 2017-09-19 DIAGNOSIS — Y831 Surgical operation with implant of artificial internal device as the cause of abnormal reaction of the patient, or of later complication, without mention of misadventure at the time of the procedure: Secondary | ICD-10-CM | POA: Insufficient documentation

## 2017-09-19 DIAGNOSIS — Z794 Long term (current) use of insulin: Secondary | ICD-10-CM | POA: Diagnosis not present

## 2017-09-19 DIAGNOSIS — I110 Hypertensive heart disease with heart failure: Secondary | ICD-10-CM | POA: Diagnosis not present

## 2017-09-19 DIAGNOSIS — R079 Chest pain, unspecified: Secondary | ICD-10-CM | POA: Insufficient documentation

## 2017-09-19 DIAGNOSIS — I5032 Chronic diastolic (congestive) heart failure: Secondary | ICD-10-CM | POA: Insufficient documentation

## 2017-09-19 DIAGNOSIS — I359 Nonrheumatic aortic valve disorder, unspecified: Secondary | ICD-10-CM | POA: Insufficient documentation

## 2017-09-19 DIAGNOSIS — I25810 Atherosclerosis of coronary artery bypass graft(s) without angina pectoris: Secondary | ICD-10-CM | POA: Diagnosis not present

## 2017-09-19 DIAGNOSIS — Z955 Presence of coronary angioplasty implant and graft: Secondary | ICD-10-CM | POA: Diagnosis not present

## 2017-09-19 DIAGNOSIS — I2582 Chronic total occlusion of coronary artery: Secondary | ICD-10-CM | POA: Insufficient documentation

## 2017-09-19 DIAGNOSIS — Z79899 Other long term (current) drug therapy: Secondary | ICD-10-CM | POA: Insufficient documentation

## 2017-09-19 LAB — TROPONIN I
Troponin I: <0.03 ng/mL (ref <0.03)
Troponin I: <0.03 ng/mL (ref <0.03)

## 2017-09-19 LAB — CBC WITH DIFFERENTIAL/PLATELET
Basophils Absolute: 0 10*3/uL (ref 0.0–0.1)
Basophils Relative: 0 %
EOS PCT: 2 %
Eosinophils Absolute: 0.2 10*3/uL (ref 0.0–0.7)
HCT: 32.2 % — ABNORMAL LOW (ref 36.0–46.0)
Hemoglobin: 9.8 g/dL — ABNORMAL LOW (ref 12.0–15.0)
LYMPHS PCT: 18 %
Lymphs Abs: 2 10*3/uL (ref 0.7–4.0)
MCH: 23.2 pg — ABNORMAL LOW (ref 26.0–34.0)
MCHC: 30.4 g/dL (ref 30.0–36.0)
MCV: 76.1 fL — AB (ref 78.0–100.0)
MONO ABS: 1 10*3/uL (ref 0.1–1.0)
Monocytes Relative: 8 %
Neutro Abs: 8.1 10*3/uL — ABNORMAL HIGH (ref 1.7–7.7)
Neutrophils Relative %: 72 %
PLATELETS: 211 10*3/uL (ref 150–400)
RBC: 4.23 MIL/uL (ref 3.87–5.11)
RDW: 16.1 % — ABNORMAL HIGH (ref 11.5–15.5)
WBC: 11.4 10*3/uL — ABNORMAL HIGH (ref 4.0–10.5)

## 2017-09-19 LAB — COMPREHENSIVE METABOLIC PANEL
ALBUMIN: 2.9 g/dL — AB (ref 3.5–5.0)
ALK PHOS: 108 U/L (ref 38–126)
ALT: 12 U/L — ABNORMAL LOW (ref 14–54)
AST: 13 U/L — AB (ref 15–41)
Anion gap: 11 (ref 5–15)
BILIRUBIN TOTAL: 0.4 mg/dL (ref 0.3–1.2)
BUN: 11 mg/dL (ref 6–20)
CALCIUM: 8.4 mg/dL — AB (ref 8.9–10.3)
CO2: 22 mmol/L (ref 22–32)
Chloride: 104 mmol/L (ref 101–111)
Creatinine, Ser: 0.9 mg/dL (ref 0.44–1.00)
GFR calc Af Amer: >60 mL/min (ref >60)
GFR calc non Af Amer: >60 mL/min (ref >60)
GLUCOSE: 132 mg/dL — AB (ref 65–99)
Potassium: 3.9 mmol/L (ref 3.5–5.1)
Sodium: 137 mmol/L (ref 135–145)
TOTAL PROTEIN: 6.4 g/dL — AB (ref 6.5–8.1)

## 2017-09-19 LAB — GLUCOSE, CAPILLARY
GLUCOSE-CAPILLARY: 206 mg/dL — AB (ref 65–99)
Glucose-Capillary: 124 mg/dL — ABNORMAL HIGH (ref 65–99)

## 2017-09-19 LAB — APTT: aPTT: 31 seconds (ref 24–36)

## 2017-09-19 LAB — PROTIME-INR
INR: 1.1
Prothrombin Time: 14.1 seconds (ref 11.4–15.2)

## 2017-09-19 LAB — HEMOGLOBIN A1C
Hgb A1c MFr Bld: 8.2 % — ABNORMAL HIGH (ref 4.8–5.6)
Mean Plasma Glucose: 188.64 mg/dL

## 2017-09-19 MED ORDER — AMLODIPINE BESYLATE 2.5 MG PO TABS
2.5000 mg | ORAL_TABLET | Freq: Every day | ORAL | Status: DC
Start: 2017-09-20 — End: 2017-09-20
  Administered 2017-09-20: 2.5 mg via ORAL
  Filled 2017-09-19: qty 1

## 2017-09-19 MED ORDER — ASPIRIN 81 MG PO CHEW
324.0000 mg | CHEWABLE_TABLET | ORAL | Status: AC
  Administered 2017-09-19: 324 mg via ORAL
  Filled 2017-09-19: qty 4

## 2017-09-19 MED ORDER — ASPIRIN 300 MG RE SUPP: 300.0000 mg | RECTAL | Status: AC

## 2017-09-19 MED ORDER — LEVOTHYROXINE SODIUM 25 MCG PO TABS
137.0000 ug | ORAL_TABLET | Freq: Every day | ORAL | Status: DC
  Administered 2017-09-20: 137 ug via ORAL
  Filled 2017-09-19: qty 1

## 2017-09-19 MED ORDER — ALBUTEROL SULFATE (2.5 MG/3ML) 0.083% IN NEBU: 2.5000 mg | INHALATION_SOLUTION | RESPIRATORY_TRACT | Status: DC | PRN

## 2017-09-19 MED ORDER — HEPARIN SODIUM (PORCINE) 5000 UNIT/ML IJ SOLN
5000.0000 [IU] | Freq: Three times a day (TID) | INTRAMUSCULAR | Status: DC
  Administered 2017-09-19 – 2017-09-20 (×3): 5000 [IU] via SUBCUTANEOUS
  Filled 2017-09-19 (×3): qty 1

## 2017-09-19 MED ORDER — NITROGLYCERIN 0.4 MG SL SUBL: 0.4000 mg | SUBLINGUAL_TABLET | SUBLINGUAL | Status: DC | PRN

## 2017-09-19 MED ORDER — FLUOXETINE HCL 20 MG PO CAPS
40.0000 mg | ORAL_CAPSULE | Freq: Two times a day (BID) | ORAL | Status: DC
  Administered 2017-09-19 – 2017-09-20 (×2): 40 mg via ORAL
  Filled 2017-09-19 (×2): qty 2

## 2017-09-19 MED ORDER — PANTOPRAZOLE SODIUM 20 MG PO TBEC
20.0000 mg | DELAYED_RELEASE_TABLET | Freq: Every day | ORAL | Status: DC
  Administered 2017-09-20: 20 mg via ORAL
  Filled 2017-09-19: qty 1

## 2017-09-19 MED ORDER — ONDANSETRON HCL 4 MG/2ML IJ SOLN: 4.0000 mg | Freq: Four times a day (QID) | INTRAMUSCULAR | Status: DC | PRN

## 2017-09-19 MED ORDER — INSULIN ASPART 100 UNIT/ML ~~LOC~~ SOLN
0.0000 [IU] | Freq: Three times a day (TID) | SUBCUTANEOUS | Status: DC
  Administered 2017-09-20: 3 [IU] via SUBCUTANEOUS
  Administered 2017-09-20: 4 [IU] via SUBCUTANEOUS

## 2017-09-19 MED ORDER — ONDANSETRON HCL 4 MG PO TABS: 4.0000 mg | ORAL_TABLET | Freq: Four times a day (QID) | ORAL | Status: DC | PRN

## 2017-09-19 MED ORDER — ASPIRIN EC 81 MG PO TBEC: 81.0000 mg | DELAYED_RELEASE_TABLET | Freq: Every day | ORAL | Status: DC

## 2017-09-19 NOTE — Patient Instructions (Signed)
Medication Instructions:  Your physician recommends that you continue on your current medications as directed. Please refer to the Current Medication list given to you today.   Labwork: none  Testing/Procedures: none  Follow-Up: Admitting office will call you later today when a room becomes available at [facility].   Any Other Special Instructions Will Be Listed Below (If Applicable).     If you need a refill on your cardiac medications before your next appointment, please call your pharmacy.

## 2017-09-19 NOTE — Progress Notes (Signed)
Checked on the patient, still has some chest tenderness. Labwork pending  Risk and benefit of procedure explained to the patient who display clear understanding and agree to proceed.  Discussed with patient possible procedural risk include bleeding, vascular injury, renal injury, arrythmia, MI, stroke and loss of limb or life.   Patient also mentioned that she had cath before and was unable to access graft from radial cath and ended up having groin access instead. No radial cath tomorrow, proceed with groin stick.   Hilbert Corrigan PA Pager: [phone]

## 2017-09-19 NOTE — H&P (Signed)
Chief Complaint  Patient presents with  . Coronary Artery Disease     History of Present Illness: 72 yo female with history of anemia, HTN, HLD, DM, COPD, CAD s/p prior stent placements followed by CABG and AVR May 2015 who is here today for cardiac follow up. She had a DES placed in the RCA and mid Circumflex in May 2009. She was admitted 10/2013 at [facility] with chest pain. Echo demonstrated normal LVEF, mod AI and mild AS. Myoview was negative for ischemia. She followed up with Dr. Ida Rogue in [place] with continued symptoms and was set up for [facility]. This demonstrated 3v CAD with severe prox LAD stenosis and ostial RCA. She was referred for CABG. Intraoperative TEE demonstrated mild AS and mod to severe AI. She underwent CABG with Dr. Cyndia Bent 10/12/13 (L-LAD, S-RCA) + AVR with a bioprosthetic valve (21 mm [employer]). Echo June 2015 with normal LVEF and normal AVR. She was seen in our office 12/03/13 by Richardson Dopp, PA-C and still having chest pain. Troponin was negative. D-dimer was elevated. Pt was sent to [facility] for CTA chest to exclude PE that evening. NO PE on chest CT but noted to have small left pleural effusion. Admitted to [facility] 07/26/14 with chest pain. Stress myoview February 2016 with no ischemia. She does not tolerate statins or Zetia. Seen in lipid clinic Feb 2016 and tried on low dose Pravachol but did not tolerate. She was admitted to [facility] December 2017 with unstable angina. Cardiac cath with patent LIMA to LAD and SVG to RCA but progression of stent restenosis in the proximal circumflex which was treated with cutting balloon angioplasty. She had Second degree AV block noted in the hospital. She did not tolerate Brilinta and was placed on Effient. She had continued dizziness post discharge and cardiac monitor showed sinus with 1st degree AV block, second degree AV block without diary notation of symptoms during these events. She was seen by Dr. Caryl Comes who did not feel that her  bradycardia and heart block was related to her dizziness. He suggested that her dyspnea may be related to chronotropic incompetence and suggested stress testing. Exercise stress test on 07/27/16 with 1 minute 36 seconds of exercise stopped due to dizziness. Heart rate responded normally to exercise. Her trazodone was stopped and her dizziness resolved. Effient was stopped in September 2018 due to GI bleeding/anemia. She was seen in our office in January 2019 with c/o rapid heart rate with minimal exercise. 48 hour cardiac monitor showed sinus rhythm with bradycardia, rates as low as 52 bpm. Rare PACs, Rare PVCs. Echo January 2019 with LVEF=60-65%. AVR working well but the gradients across the valve were increased from the prior study.   She is here today for follow up. She is feeling terrible. She has chest pain/pressure when walking across the room. She has severe dyspnea with minimal exertion. This feels like her prior episodes of unstable angina. She is asking to be admitted today. She is feeling dizzy. She has had fevers for the past 2 weeks and has finished a round of steroids and amoxicillin. She is felt to have a viral syndrome. No LE edema. Her weight has been stable at home. She has no energy. Her heart has been racing at home. Pulse at rest jumps from 70 to 110.   Primary Care Physician: Glendon Axe, MD       Past Medical History:  Diagnosis Date  . 1st degree AV block   .  ACE-inhibitor cough   . Allergic rhinitis   . Anemia    iron deficiency anemia  . Anxiety   . Aortic ectasia (HCC)    a. CT abd in 12/2016 incidentally noted aortic atherosclerosis and infrarenal abdominal aortic ectasia measuring as large as 2.7 cm with recommendation to repeat US in 2023.  Marland Kitchen Arthritis   . Asthma   . Cataract   . Chronic depression   . Chronic diastolic CHF (congestive heart failure) ([place])   . Chronic headache   . COPD (chronic obstructive pulmonary disease) ([place])   .  Coronary artery disease    a. DES to RCA and mid Cx 2009. b. CABG and bioprosthetic AVR May 2015. c. cutting balloon to prox Cx in 05/2016  . Diabetes mellitus    type 2  . Diverticulitis of colon   . Essential hypertension   . GERD (gastroesophageal reflux disease)   . Hearing loss   . History of blood transfusion 2013  . History of prosthetic aortic valve replacement   . HOH (hard of hearing)   . Hypercholesterolemia    intolerance of statins and niaspan  . Mobitz type 1 second degree AV block   . OSA (obstructive sleep apnea)    mild, intolerant of cpap  . PAD (peripheral artery disease) ([place])    a. atherosclerosis by CT abd 12/2016 in [place].  Marland Kitchen PONV (postoperative nausea and vomiting)   . Statin intolerance   . Thyroid disease          Past Surgical History:  Procedure Laterality Date  . ABDOMINAL HYSTERECTOMY    . ABDOMINAL HYSTERECTOMY W/ PARTIAL VAGINACTOMY    . AORTIC VALVE REPLACEMENT N/A 10/12/2013   Procedure: AORTIC VALVE REPLACEMENT (AVR);  Surgeon: [name], MD;  Location: [place];  Service: Open Heart Surgery;  Laterality: N/A;  . [place]  . BARTHOLIN GLAND CYST EXCISION    . BLADDER SUSPENSION    . BREAST BIOPSY Bilateral 09/11/2000   neg  . BREAST BIOPSY Left 07/24/2010   neg  . BREAST CYST EXCISION  1988   bilateral nonmalignant tumors, x3  . CARDIAC CATHETERIZATION    . CARDIAC CATHETERIZATION N/A 05/25/2016   Procedure: Coronary Balloon Angioplasty;  Surgeon: Leonie Man, MD;  Location: [place] [facility];  Service: Cardiovascular;  Laterality: N/A;  . CARDIAC CATHETERIZATION N/A 05/25/2016   Procedure: Coronary/Graft Angiography;  Surgeon: Leonie Man, MD;  Location: [place] [facility];  Service: Cardiovascular;  Laterality: N/A;  . CATARACT EXTRACTION W/ INTRAOCULAR LENS  IMPLANT, BILATERAL    . CHOLECYSTECTOMY  2001  . COLECTOMY     lap sigmoid  . COLONOSCOPY  2014   polyps found, 2  clamped off.  . CORONARY ANGIOPLASTY  10/29/2007   Prox RCA & Mid Cx.  . CORONARY ARTERY BYPASS GRAFT N/A 10/12/2013   Procedure: CORONARY ARTERY BYPASS GRAFT TIMES TWO;  Surgeon: [name], MD;  Location: [place];  Service: Open Heart Surgery;  Laterality: N/A;  . LEFT HEART CATHETERIZATION WITH CORONARY ANGIOGRAM N/A 10/09/2013   Procedure: LEFT HEART CATHETERIZATION WITH CORONARY ANGIOGRAM;  Surgeon: [name], MD;  Location[facility];  Service: Cardiovascular;  Laterality: N/A;  . STERNAL WIRES REMOVAL N/A 04/13/2014   Procedure: STERNAL WIRES REMOVAL;  Surgeon: [name], MD;  Location: [facility] OR;  Service: Thoracic;  Laterality: N/A;  . THYROIDECTOMY    . TONSILLECTOMY    . TUBAL LIGATION    .  VAGINAL DELIVERY     3  . VISCERAL ARTERY INTERVENTION N/A 08/16/2016   Procedure: Visceral Artery Intervention;  Surgeon: Algernon Huxley, MD;  Location: [place] [facility];  Service: Cardiovascular;  Laterality: N/A;          Current Outpatient Medications  Medication Sig Dispense Refill  . albuterol (PROAIR HFA) 108 (90 Base) MCG/ACT inhaler Inhale 2 puffs into the lungs every 4 (four) hours as needed for wheezing or shortness of breath. 1 Inhaler 2  . amLODipine (NORVASC) 2.5 MG tablet Take 2.5 mg by mouth daily.    Marland Kitchen aspirin EC 81 MG tablet Take 1 tablet (81 mg total) by mouth daily. 90 tablet 3  . Calcium-Vitamin D (CALTRATE 600 PLUS-VIT D PO) Take 2 tablets by mouth 2 (two) times daily.     Marland Kitchen FLUoxetine (PROZAC) 40 MG capsule Take 40 mg by mouth 2 (two) times daily.    . insulin NPH-regular Human (NOVOLIN 70/30) (70-30) 100 UNIT/ML injection Inject 70 Units into the skin 2 (two) times daily.    Marland Kitchen levothyroxine (SYNTHROID, LEVOTHROID) 137 MCG tablet Take by mouth.    . nitroGLYCERIN (NITROSTAT) 0.4 MG SL tablet Place 1 tablet (0.4 mg total) under the tongue every 5 (five) minutes as needed for chest pain. 25 tablet 1  . ondansetron (ZOFRAN) 4 MG  tablet Take 1 tablet (4 mg total) by mouth every 6 (six) hours as needed for nausea. 20 tablet 0  . ONETOUCH DELICA LANCETS 64P MISC USE TO CHECK BLOOD SUGAR THREE TIMES DAILY    . pantoprazole (PROTONIX) 20 MG tablet Take 20 mg by mouth daily.     No current facility-administered medications for this visit.          Allergies  Allergen Reactions  . Amitriptyline Other (See Comments)    Unknown reaction  . Benadryl [Diphenhydramine] Shortness Of Breath  . Demerol [Meperidine] Other (See Comments)    Unknown reaction  . Gabapentin Other (See Comments)    Unknown reaction  . Loratadine Other (See Comments)    Unknown reaction  . Meperidine Hcl Other (See Comments)    Unknown reaction  . Mirtazapine Other (See Comments)    Unknown reaction  . Olanzapine Other (See Comments)    Unknown reaction   . Voltaren [Diclofenac Sodium] Shortness Of Breath  . Zetia [Ezetimibe] Other (See Comments)    Weakness in legs, shakiness all over  . Ativan [Lorazepam] Other (See Comments)    Causes double vision at highter than .5 mg dose  . Atorvastatin Other (See Comments)    Muscle aches and weakness  . Budesonide-Formoterol Fumarate Other (See Comments)    Shakiness, tremors  . Bupropion Hcl Other (See Comments)    "cloud over me" depression  . Caffeine Other (See Comments)    jitters  . Codeine Sulfate Other (See Comments)    Makes chest hurt like a heart attack  . Lisinopril Cough  . Metformin Nausea And Vomiting  . Mometasone Furoate Nausea And Vomiting  . Morphine Sulfate Other (See Comments)    Chest pain like a heart attack  . Other Other (See Comments)    Beta Blockers, reaction shortness of breath  . Oxycodone-Acetaminophen Nausea And Vomiting  . Pioglitazone Other (See Comments)    Cannot take because of risk of bladder cancer  . Propoxyphene N-Acetaminophen Nausea And Vomiting  . Rosuvastatin Other (See Comments)    Muscle aches  and weakness  . Shellfish Allergy Diarrhea  .  Ticagrelor     Other reaction(s): Other (See Comments) "slowed heart rate" & chest pain  . Tramadol Nausea Only  . Trazodone And Nefazodone Nausea And Vomiting  . Venlafaxine Other (See Comments)    Unknown reaction  . Zolpidem Tartrate Other (See Comments)     Jittery, diarrhea  . Latex Rash    Social History        Socioeconomic History  . Marital status: Married    Spouse name: Not on file  . Number of children: 3  . Years of education: Not on file  . Highest education level: Not on file  Occupational History  . Occupation: Retired    Fish farm manager: UNEMPLOYED    Comment: CNA  Social Needs  . Financial resource strain: Not on file  . Food insecurity:    Worry: Not on file    Inability: Not on file  . Transportation needs:    Medical: Not on file    Non-medical: Not on file  Tobacco Use  . Smoking status: Former Smoker    Packs/day: 0.50    Years: 30.00    Pack years: 15.00    Types: Cigarettes    Last attempt to quit: 10/02/2013    Years since quitting: 3.9  . Smokeless tobacco: Never Used  Substance and Sexual Activity  . Alcohol use: No  . Drug use: No  . Sexual activity: Not on file  Lifestyle  . Physical activity:    Days per week: Not on file    Minutes per session: Not on file  . Stress: Not on file  Relationships  . Social connections:    Talks on phone: Not on file    Gets together: Not on file    Attends religious service: Not on file    Active member of club or organization: Not on file    Attends meetings of clubs or organizations: Not on file    Relationship status: Not on file  . Intimate partner violence:    Fear of current or ex partner: Not on file    Emotionally abused: Not on file    Physically abused: Not on file    Forced sexual activity: Not on file  Other Topics Concern  . Not on file  Social History Narrative   Does not have  Living Will   Desires CPR, would not want prolonged life support if futile.         Family History  Problem Relation Age of Onset  . Breast cancer Mother 80  . Hypertension Father   . Mesothelioma Father   . Asthma Father   . Stroke Paternal Grandfather   . Heart disease Unknown   . Breast cancer Maternal Aunt   . Breast cancer Paternal Aunt     Review of Systems:  As stated in the HPI and otherwise negative.   BP 126/70   Pulse 71   Ht 5\' 6"  (1.676 m)   Wt 192 lb 9.6 oz (87.4 kg)   SpO2 97%   BMI 31.09 kg/m   Physical Examination:  General: Well developed, well nourished, NAD  HEENT: OP clear, mucus membranes moist  SKIN: warm, dry. No rashes. Neuro: No focal deficits  Musculoskeletal: Muscle strength 5/5 all ext  Psychiatric: Mood and affect normal  Neck: No JVD, no carotid bruits, no thyromegaly, no lymphadenopathy.  Lungs:Clear bilaterally, no wheezes, rhonci, crackles Cardiovascular: Regular rate and rhythm. Loud systolic murmur.  Abdomen:Soft. Bowel sounds present. Non-tender.  Extremities: No lower  extremity edema. Pulses are 2 + in the bilateral DP/PT.  Echo 07/04/17: Left ventricle: The cavity size was normal. There was moderate concentric hypertrophy. Systolic function was normal. The estimated ejection fraction was in the range of 60% to 65%. Wall motion was normal; there were no regional wall motion abnormalities. Doppler parameters are consistent with abnormal left ventricular relaxation (grade 1 diastolic dysfunction). Doppler parameters are consistent with elevated ventricular end-diastolic filling pressure. - Aortic valve: A bioprosthetic valve sits well in the aortic position. There was no regurgitation. Mean gradient (S): 24 mm Hg. Peak gradient (S): 44 mm Hg. - Aortic root: The aortic root was normal in size. - Ascending aorta: The ascending aorta was mildly dilated measuring 41 mm. - Mitral valve: Calcified  annulus. Moderately thickened, moderately calcified leaflets . - Left atrium: The atrium was moderately dilated. - Right ventricle: Systolic function was normal. - Tricuspid valve: There was mild regurgitation. - Pulmonary arteries: Systolic pressure was at the upper limits of normal. - Inferior vena cava: The vessel was normal in size. - Pericardium, extracardiac: There was no pericardial effusion.  Impressions:  - LVEF 60-65%. Abnormal relaxation and elevated filling pressures. A bioprosthetic valve sits well in the aortic position. Transaortic gradients are elevated for this type of prosthesis and increased since the prior exam on 05/25/2016, now peak/mean 44/24 mmHg, previously 22/11 mmHg.   Cardiac cath 04/25/16: Diagnostic Diagram     Post-Intervention Diagram        EKG:  EKG is ordered today. The ekg ordered today demonstrates sinus rhythm with PACs, rate 82 bpm.   Recent Labs: 07/08/2017: TSH 0.133 08/05/2017: ALT 12; BUN 15; Creatinine, Ser 0.99; Hemoglobin 10.7; Platelets 209; Potassium 4.0; Sodium 135   Lipid Panel Labs(Brief)          Component Value Date/Time   CHOL 206 (H) 03/16/2015 0759   CHOL 211 (H) 10/03/2013 0515   TRIG 369 (H) 03/16/2015 0759   TRIG 272 (H) 10/03/2013 0515   HDL 21 (L) 03/16/2015 0759   HDL 22 (L) 10/03/2013 0515   CHOLHDL 9.8 (H) 03/16/2015 0759   VLDL 74 (H) 03/16/2015 0759   VLDL 54 (H) 10/03/2013 0515   LDLCALC 111 03/16/2015 0759   LDLCALC 135 (H) 10/03/2013 0515   LDLDIRECT 127.0 07/07/2014 0820          Wt Readings from Last 3 Encounters:  09/19/17 192 lb 9.6 oz (87.4 kg)  08/05/17 190 lb (86.2 kg)  07/04/17 190 lb (86.2 kg)     Other studies Reviewed: Additional studies/ records that were reviewed today include: . Review of the above records demonstrates:    Assessment and Plan:   1. CAD s/p CABG with unstable angina: She is having dyspnea and chest pain c/w  unstable angina. Her EKG shows no ischemic changes. She is on ASA. She has not been on a beta blocker due to bradycardia. She has not tolerated statins.  -She will be admitted to [facility] to telemetry. -I will arrange a CBC, CMET, troponin and d-dimer.  -Echo later today to assess her AVR -Chest x-ray - She will need a cardiac cath tomorrow.     2. Aortic valve disease: She is s/p AVR. The mean gradient across her bioprosthetic AVR increased to 24 mmHg from 11 mmHg over the last year. I will repeat an echo during this admission to evaluate her valve again. This could represent early valve failure.    3. Hypertension: BP is controlled. No changes.  4. Hyperlipidemia: She is statin intolerant (Crestor, Lipitor, Pravastatin). She did not tolerate Zetia.  She was seen in lipid clinic 07/13/14 and tried on low dose Pravastatin but did not tolerate due to muscle aches. We discussed Praluent or Repatha but she did not pursue this. She will need referral to the lipid clinic now to discuss.    She will go home now and will come back to be admitted when a bed is available. She does not wish to go to the ED.   Current medicines are reviewed at length with the patient today.  The patient does not have concerns regarding medicines.  The following changes have been made:  no change  Labs/ tests ordered today include:      Orders Placed This Encounter  Procedures  . EKG 12-Lead    Disposition:   FU with me after discharge.   Signed, [name], MD 09/19/2017 11:08 AM    [facility] [address], [place], [place]  [address] Phone: [phone]; Fax: [phone]

## 2017-09-20 ENCOUNTER — Encounter (HOSPITAL_COMMUNITY): Payer: Self-pay

## 2017-09-20 ENCOUNTER — Observation Stay (HOSPITAL_BASED_OUTPATIENT_CLINIC_OR_DEPARTMENT_OTHER): Payer: Medicare HMO

## 2017-09-20 ENCOUNTER — Ambulatory Visit (HOSPITAL_COMMUNITY)
Admission: AD | Disposition: A | Discharge status: Home / Self Care | Payer: Self-pay | Source: Ambulatory Visit | Attending: Cardiovascular Disease

## 2017-09-20 ENCOUNTER — Ambulatory Visit (HOSPITAL_COMMUNITY): Admission: RE | Admit: 2017-09-20 | Payer: Medicare HMO | Source: Ambulatory Visit | Admitting: Cardiovascular Disease

## 2017-09-20 DIAGNOSIS — I5030 Unspecified diastolic (congestive) heart failure: Secondary | ICD-10-CM | POA: Diagnosis not present

## 2017-09-20 DIAGNOSIS — R0600 Dyspnea, unspecified: Secondary | ICD-10-CM | POA: Diagnosis not present

## 2017-09-20 DIAGNOSIS — I25810 Atherosclerosis of coronary artery bypass graft(s) without angina pectoris: Secondary | ICD-10-CM | POA: Diagnosis not present

## 2017-09-20 DIAGNOSIS — I359 Nonrheumatic aortic valve disorder, unspecified: Secondary | ICD-10-CM | POA: Diagnosis not present

## 2017-09-20 DIAGNOSIS — I25110 Atherosclerotic heart disease of native coronary artery with unstable angina pectoris: Secondary | ICD-10-CM | POA: Diagnosis not present

## 2017-09-20 DIAGNOSIS — E785 Hyperlipidemia, unspecified: Secondary | ICD-10-CM | POA: Diagnosis not present

## 2017-09-20 DIAGNOSIS — R079 Chest pain, unspecified: Secondary | ICD-10-CM | POA: Diagnosis not present

## 2017-09-20 HISTORY — PX: CORONARY/GRAFT ANGIOGRAPHY: CATH118237

## 2017-09-20 LAB — ECHOCARDIOGRAM COMPLETE
HEIGHTINCHES: 66 in
Weight: 3041.6 oz

## 2017-09-20 LAB — BASIC METABOLIC PANEL
ANION GAP: 10 (ref 5–15)
BUN: 11 mg/dL (ref 6–20)
CALCIUM: 8.2 mg/dL — AB (ref 8.9–10.3)
CO2: 22 mmol/L (ref 22–32)
Chloride: 105 mmol/L (ref 101–111)
Creatinine, Ser: 0.81 mg/dL (ref 0.44–1.00)
GFR calc non Af Amer: >60 mL/min (ref >60)
Glucose, Bld: 183 mg/dL — ABNORMAL HIGH (ref 65–99)
Potassium: 4.1 mmol/L (ref 3.5–5.1)
Sodium: 137 mmol/L (ref 135–145)

## 2017-09-20 LAB — LIPID PANEL
CHOLESTEROL: 173 mg/dL (ref 0–200)
HDL: 22 mg/dL — ABNORMAL LOW (ref >40)
LDL CALC: 110 mg/dL — AB (ref 0–99)
TRIGLYCERIDES: 207 mg/dL — AB (ref <150)
Total CHOL/HDL Ratio: 7.9 RATIO
VLDL: 41 mg/dL — ABNORMAL HIGH (ref 0–40)

## 2017-09-20 LAB — TROPONIN I: Troponin I: <0.03 ng/mL (ref <0.03)

## 2017-09-20 LAB — GLUCOSE, CAPILLARY
Glucose-Capillary: 174 mg/dL — ABNORMAL HIGH (ref 65–99)
Glucose-Capillary: 137 mg/dL — ABNORMAL HIGH (ref 65–99)
Glucose-Capillary: 215 mg/dL — ABNORMAL HIGH (ref 65–99)

## 2017-09-20 SURGERY — CORONARY/GRAFT ANGIOGRAPHY
Anesthesia: LOCAL

## 2017-09-20 MED ORDER — SODIUM CHLORIDE 0.9% FLUSH: 3.0000 mL | Freq: Two times a day (BID) | INTRAVENOUS | Status: DC

## 2017-09-20 MED ORDER — SODIUM CHLORIDE 0.9% FLUSH: 3.0000 mL | INTRAVENOUS | Status: DC | PRN

## 2017-09-20 MED ORDER — MIDAZOLAM HCL 2 MG/2ML IJ SOLN
INTRAMUSCULAR | Status: AC
  Filled 2017-09-20: qty 2

## 2017-09-20 MED ORDER — SODIUM CHLORIDE 0.9 % WEIGHT BASED INFUSION: 1.0000 mL/kg/h | INTRAVENOUS | Status: DC

## 2017-09-20 MED ORDER — FENTANYL CITRATE (PF) 100 MCG/2ML IJ SOLN
INTRAMUSCULAR | Status: AC
  Filled 2017-09-20: qty 2

## 2017-09-20 MED ORDER — SODIUM CHLORIDE 0.9 % WEIGHT BASED INFUSION
1.0000 mL/kg/h | INTRAVENOUS | Status: DC
  Administered 2017-09-20: 1 mL/kg/h via INTRAVENOUS

## 2017-09-20 MED ORDER — MIDAZOLAM HCL 2 MG/2ML IJ SOLN
INTRAMUSCULAR | Status: DC | PRN
  Administered 2017-09-20 (×3): 1 mg via INTRAVENOUS

## 2017-09-20 MED ORDER — ASPIRIN 81 MG PO CHEW: 81.0000 mg | CHEWABLE_TABLET | ORAL | Status: DC

## 2017-09-20 MED ORDER — SODIUM CHLORIDE 0.9 % WEIGHT BASED INFUSION: 1.0000 mL/kg/h | INTRAVENOUS | Status: AC

## 2017-09-20 MED ORDER — ASPIRIN 81 MG PO CHEW
81.0000 mg | CHEWABLE_TABLET | ORAL | Status: AC
  Administered 2017-09-20: 81 mg via ORAL
  Filled 2017-09-20: qty 1

## 2017-09-20 MED ORDER — IOHEXOL 350 MG/ML SOLN
INTRAVENOUS | Status: DC | PRN
  Administered 2017-09-20: 100 mL

## 2017-09-20 MED ORDER — SODIUM CHLORIDE 0.9 % IV SOLN: 250.0000 mL | INTRAVENOUS | Status: DC | PRN

## 2017-09-20 MED ORDER — HEPARIN (PORCINE) IN NACL 1000-0.9 UT/500ML-% IV SOLN
INTRAVENOUS | Status: AC
  Filled 2017-09-20: qty 1000

## 2017-09-20 MED ORDER — LIDOCAINE HCL (PF) 1 % IJ SOLN
INTRAMUSCULAR | Status: AC
  Filled 2017-09-20: qty 30

## 2017-09-20 MED ORDER — FENTANYL CITRATE (PF) 100 MCG/2ML IJ SOLN
INTRAMUSCULAR | Status: DC | PRN
  Administered 2017-09-20 (×3): 25 ug via INTRAVENOUS

## 2017-09-20 MED ORDER — HEPARIN (PORCINE) IN NACL 2-0.9 UNITS/ML
INTRAMUSCULAR | Status: AC | PRN
  Administered 2017-09-20 (×2): 500 mL

## 2017-09-20 MED ORDER — LIDOCAINE HCL (PF) 1 % IJ SOLN
INTRAMUSCULAR | Status: DC | PRN
  Administered 2017-09-20: 15 mL

## 2017-09-20 MED ORDER — SODIUM CHLORIDE 0.9 % WEIGHT BASED INFUSION
3.0000 mL/kg/h | INTRAVENOUS | Status: AC
  Administered 2017-09-20: 3 mL/kg/h via INTRAVENOUS

## 2017-09-20 MED ORDER — SODIUM CHLORIDE 0.9 % WEIGHT BASED INFUSION: 3.0000 mL/kg/h | INTRAVENOUS | Status: DC

## 2017-09-20 SURGICAL SUPPLY — 15 items
CATH INFINITI 5 FR IM (CATHETERS) ×1 IMPLANT
CATH INFINITI 5 FR MPA2 (CATHETERS) ×1 IMPLANT
CATH INFINITI 5 FR RCB (CATHETERS) ×1 IMPLANT
CATH INFINITI 5FR AL1 (CATHETERS) ×1 IMPLANT
CATH INFINITI 5FR MULTPACK ANG (CATHETERS) ×1 IMPLANT
COVER PRB 48X5XTLSCP FOLD TPE (BAG) IMPLANT
COVER PROBE 5X48 (BAG) ×2
KIT HEART LEFT (KITS) ×2 IMPLANT
PACK CARDIAC CATHETERIZATION (CUSTOM PROCEDURE TRAY) ×2 IMPLANT
SHEATH AVANTI 11CM 5FR (SHEATH) ×1 IMPLANT
SHEATH AVANTI 11CM 7FR (SHEATH) IMPLANT
TRANSDUCER W/STOPCOCK (MISCELLANEOUS) ×2 IMPLANT
TUBING CIL FLEX 10 FLL-RA (TUBING) ×2 IMPLANT
WIRE EMERALD 3MM-J .035X150CM (WIRE) ×1 IMPLANT
WIRE EMERALD ST .035X150CM (WIRE) ×1 IMPLANT

## 2017-09-20 NOTE — Progress Notes (Signed)
Patient received discharge information and acknowledged understanding of it. Patient received femoral site care education. Patient IV was removed.  

## 2017-09-20 NOTE — Progress Notes (Signed)
  Echocardiogram 2D Echocardiogram has been performed.  Jannett Celestine 09/20/2017, 2:11 PM

## 2017-09-20 NOTE — Progress Notes (Addendum)
Progress Note  Patient Name: [name] Date of Encounter: 09/20/2017  Primary Cardiologist: Megan Chandler, MD   Subjective   Currently CP free. Resting comfortably in bed. Husband by bedside.   Inpatient Medications    Scheduled Meds: . amLODipine  2.5 mg Oral Daily  . aspirin EC  81 mg Oral Daily  . FLUoxetine  40 mg Oral BID  . heparin  5,000 Units Subcutaneous Q8H  . insulin aspart  0-20 Units Subcutaneous TID WC  . levothyroxine  137 mcg Oral QAC breakfast  . pantoprazole  20 mg Oral Daily  . sodium chloride flush  3 mL Intravenous Q12H   Continuous Infusions: . sodium chloride    . sodium chloride 1 mL/kg/hr (09/20/17 0719)   PRN Meds: sodium chloride, albuterol, nitroGLYCERIN, ondansetron (ZOFRAN) IV, ondansetron, sodium chloride flush   Vital Signs    Vitals:   09/19/17 1530 09/19/17 2035 09/20/17 0400  BP: 132/66 129/64 131/70  Pulse: 69 75 66  Resp: 18 17 15   Temp: 99.2 F (37.3 C) (!) 100.4 F (38 C) 99.3 F (37.4 C)  TempSrc: Oral Oral Oral  SpO2: 97% 96% 97%  Weight: 192 lb 9.6 oz (87.4 kg)  190 lb 1.6 oz (86.2 kg)  Height: 5\' 6"  (1.676 m)      Intake/Output Summary (Last 24 hours) at 09/20/2017 0751 Last data filed at 09/20/2017 0654 Gross per 24 hour  Intake 214.13 ml  Output -  Net 214.13 ml   Filed Weights   09/19/17 1530 09/20/17 0400  Weight: 192 lb 9.6 oz (87.4 kg) 190 lb 1.6 oz (86.2 kg)    Telemetry    NSR- Personally Reviewed  ECG    NSR w/ 1st degree AV block - Personally Reviewed  Physical Exam   GEN: No acute distress.  moderately obese  Neck: No JVD Cardiac: RRR, 2/6 murmur present Respiratory: Clear to auscultation bilaterally. GI: Soft, nontender, non-distended  MS: No edema; No deformity. Neuro:  Nonfocal  Psych: Normal affect   Labs    Chemistry Recent Labs  Lab 09/19/17 1605 09/20/17 0338  NA 137 137  K 3.9 4.1  CL 104 105  CO2 22 22  GLUCOSE 132* 183*  BUN 11 11  CREATININE 0.90 0.81   CALCIUM 8.4* 8.2*  PROT 6.4*  --   ALBUMIN 2.9*  --   AST 13*  --   ALT 12*  --   ALKPHOS 108  --   BILITOT 0.4  --   GFRNONAA >60 >60  GFRAA >60 >60  ANIONGAP 11 10     Hematology Recent Labs  Lab 09/19/17 1605  WBC 11.4*  RBC 4.23  HGB 9.8*  HCT 32.2*  MCV 76.1*  MCH 23.2*  MCHC 30.4  RDW 16.1*  PLT 211    Cardiac Enzymes Recent Labs  Lab 09/19/17 1605 09/19/17 2121 09/20/17 0338  TROPONINI <0.03 <0.03 <0.03   No results for input(s): TROPIPOC in the last 168 hours.   BNPNo results for input(s): BNP, PROBNP in the last 168 hours.   DDimer No results for input(s): DDIMER in the last 168 hours.   Radiology    No results found.  Cardiac Studies   Previous LHC 05/2016 Conclusion     Culprit Lesion: Prox Cx Stent, 80 %stenosed. In-Stent Re-Stenosis  Post intervention - AngioSculpt PTCA only, there is a 15% residual stenosis. Ost Cx to Prox Cx lesion, 0 %stenosed.  ___________  Colon Flattery RCA lesion, 99 %stenosed. Prox RCA  lesion, 80 %stenosed. Dist RCA lesion, 95 %stenosed. Mid RCA lesion, 100 %stenosed. - CTO  Ost RCA to Prox RCA Stent, 20 %stenosed.  SVG-dRCA and is large and anatomically normal - perfuses both the RPDA & RPL: Origin lesion, 45 %stenosed.  LIMA-LAD and is normal in caliber, large and anatomically normal - retrograde fills major Diag2.  Mid LAD lesion, 100 %stenosed.   Likely culprit lesion is the in-stent restenosis in the circumflex. This is definitely progression of disease from 2015 prior to CABG. Treated successfully with Angioplasty Only - leaving residual 10-50% stenosis which is similar to the remainder of the stented segment.  Patent LIMA-LAD which fills the diagonal branch as well, also patent SVG-distal RCA fills both branches.    2D Echo 09/20/17 Study Conclusions  - Left ventricle: The cavity size was normal. There was moderate   concentric hypertrophy. Systolic function was normal. The   estimated ejection  fraction was in the range of 60% to 65%. Wall   motion was normal; there were no regional wall motion   abnormalities. Doppler parameters are consistent with abnormal   left ventricular relaxation (grade 1 diastolic dysfunction).   Doppler parameters are consistent with elevated ventricular   end-diastolic filling pressure. - Aortic valve: A bioprosthetic valve sits well in the aortic   position. There was no regurgitation. Mean gradient (S): 24 mm   Hg. Peak gradient (S): 44 mm Hg. - Aortic root: The aortic root was normal in size. - Ascending aorta: The ascending aorta was mildly dilated measuring   41 mm. - Mitral valve: Calcified annulus. Moderately thickened, moderately   calcified leaflets . - Left atrium: The atrium was moderately dilated. - Right ventricle: Systolic function was normal. - Tricuspid valve: There was mild regurgitation. - Pulmonary arteries: Systolic pressure was at the upper limits of   normal. - Inferior vena cava: The vessel was normal in size. - Pericardium, extracardiac: There was no pericardial effusion.  Impressions:  - LVEF 60-65%. Abnormal relaxation and elevated filling pressures.   A bioprosthetic valve sits well in the aortic position.   Transaortic gradients are elevated for this type of prosthesis   and increased since the prior exam on 05/25/2016, now peak/mean   44/24 mmHg, previously 22/11 mmHg.   Patient Profile     72 y/o female with history of anemia, HTN, HLD, DM, COPD and CAD s/p prior stent placements followed by CABG and AVR May 2015. She was readmitted in Dec 2017, with unstable angina. Cardiac cath with patent LIMA to LAD and SVG to RCA but progression of stent restenosis in the proximal circumflex which was treated with cutting balloon angioplasty. Effient was discontinued 02/2017 due to GIB/anemia. Also with h/o satin intolerance. Pt admitted from the office 09/19/17 for unstable angina.   Assessment & Plan    1. CAD w/ Unstable  Angina: S/p CABG in 2015 followed by cutting balloon angioplasty to proximal Lcx in 2017. Admitted for unstable angina. Troponins are negative x 3. Plan is for repeat LHC today +/- PCI. Echo also pending. BP and HR stable. SCr WNL. Hgb 9.8. H/o anemia and GIB in the past. Will monitor. Continue ASA and amlodipine. Not on BBs due to prior h/o bradycardia. Also statin intolerant.   2. HLD: statin intolerant. She has tried atorvastatin and rosuvastatin. She is also intolerant to Zetia. FLP shows elevated LDL at 110 mg/DL. LDL goal given CAD history is < 70 mg/dL.  Will need to consider PCSK9 inhibitor therapy.  3. DM: poorly controlled. Hgb A1c 8.2. Takes insulin at home. Intolerant to Metformin. She will need close outpatient f/u with PCP for further management. SSI ordered for inpatient coverage.   4. HTN: controlled on current regimen.   For questions or updates, please contact [place] Please consult [email or web address] for contact info under Cardiology/STEMI.      Signed, Lyda Jester, PA-C  09/20/2017, 7:51 AM    History and all data above reviewed.  Patient examined.  I agree with the findings as above. No complaints post cath.  I did review the images.  No high grade lesions to explain her symptoms.  She has had a low grade fever that her primary care has been evaluating.  she has had DOE.   The patient exam reveals COR:RRR  ,  Lungs: Clear  ,  Abd: Positive bowel sounds, no rebound no guarding, Ext Right femoral site without bleeding or brusing.   .  All available labs, radiology testing, previous records reviewed. Agree with documented assessment and plan. CAD:  Medical management.  AVR:  Dr. Burt [name] was unable to cross the valve.  Echo today and then follow up with Megan Chandler, MD.  Dyslipidemia:  Intolerant of meds.  She was eating a pepperoni pizza and a cookie post cath.  We talked about diet.  She agrees to referral to discuss possible PCSK9 inhibitor.  DM:  Not well  controlled.  I will defer to Megan Axe, MD to consider Jardiance.  Home later today.   [name]  12:15 PM  09/20/2017

## 2017-09-20 NOTE — Discharge Summary (Signed)
Discharge Summary    Patient ID: [name],  MRN: [id number], DOB/AGE: [birth year] 72 y.o.  Admit date: 09/19/2017 Discharge date: 09/20/2017  Primary Care Provider: Glendon Axe Primary Cardiologist: [name], MD  Discharge Diagnoses    Active Problems:   Unstable angina [facility])   Allergies Allergies  Allergen Reactions  . Amitriptyline Other (See Comments)    Unknown reaction  . Benadryl [Diphenhydramine] Shortness Of Breath  . Demerol [Meperidine] Other (See Comments)    Unknown reaction  . Gabapentin Other (See Comments)    Unknown reaction  . Loratadine Other (See Comments)    Unknown reaction  . Meperidine Hcl Other (See Comments)    Unknown reaction  . Mirtazapine Other (See Comments)    Unknown reaction  . Olanzapine Other (See Comments)    Unknown reaction   . Voltaren [Diclofenac Sodium] Shortness Of Breath  . Zetia [Ezetimibe] Other (See Comments)    Weakness in legs, shakiness all over  . Ativan [Lorazepam] Other (See Comments)    Causes double vision at highter than .5 mg dose  . Atorvastatin Other (See Comments)    Muscle aches and weakness  . Budesonide-Formoterol Fumarate Other (See Comments)    Shakiness, tremors  . Bupropion Hcl Other (See Comments)    "cloud over me" depression  . Caffeine Other (See Comments)    jitters  . Codeine Sulfate Other (See Comments)    Makes chest hurt like a heart attack  . Lisinopril Cough  . Metformin Nausea And Vomiting  . Mometasone Furoate Nausea And Vomiting  . Morphine Sulfate Other (See Comments)    Chest pain like a heart attack  . Other Other (See Comments)    Beta Blockers, reaction shortness of breath  . Oxycodone-Acetaminophen Nausea And Vomiting  . Pioglitazone Other (See Comments)    Cannot take because of risk of bladder cancer  . Propoxyphene N-Acetaminophen Nausea And Vomiting  . Rosuvastatin Other (See Comments)    Muscle aches and weakness  . Shellfish Allergy Diarrhea    . Ticagrelor     Other reaction(s): Other (See Comments) "slowed heart rate" & chest pain  . Tramadol Nausea Only  . Trazodone And Nefazodone Nausea And Vomiting  . Venlafaxine Other (See Comments)    Unknown reaction  . Zolpidem Tartrate Other (See Comments)     Jittery, diarrhea  . Latex Rash    Diagnostic Studies/Procedures    LHC 09/19/17 Procedures   CORONARY/GRAFT ANGIOGRAPHY  Conclusion   1. Severe native 3 vessel CAD with known total occlusion of the RCA, severe proximal LAD stenosis, and continued patency of the LCx with mild in-stent restenosis.  2. 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 Continued patency of the LIMA-LAD and SVG-RCA grafts  Recommend:  Medical management of CAD  Echo to reassess bioprosthetic aortic valve function   2D Echo 09/20/17 Study Conclusions  - Procedure narrative: Transthoracic echocardiography. Image   quality was adequate. The study was technically difficult. - Left ventricle: The cavity size was normal. There was mild   concentric hypertrophy. Systolic function was normal. The   estimated ejection fraction was in the range of 55% to 60%. Wall   motion was normal; there were no regional wall motion   abnormalities. Features are consistent with a pseudonormal left   ventricular filling pattern, with concomitant abnormal relaxation   and increased filling pressure (grade 2  diastolic dysfunction). - Ventricular septum: Septal motion showed paradox. These changes   are consistent with a post-thoracotomy state. - Aortic valve: A bioprosthesis was present and functioning   normally. Valve area (VTI): 1.98 cm^2. Valve area (Vmax): 1.7   cm^2. Valve area (Vmean): 1.77 cm^2. - Mitral valve: Calcified annulus. Valve area by pressure   half-time: 1.56 cm^2. - Left atrium: The atrium was mildly dilated.   History of Present Illness     72yo female  with history of anemia, HTN, HLD, DM, COPD, CAD s/p prior stent placements followed by CABG and AVR in May 2015. She had a DES placed in the RCA and mid Circumflex in May 2009. She was admitted 10/2013 at [facility] with chest pain. Echo demonstrated normal LVEF, mod AI and mild AS. Myoview was negative for ischemia. She followed up with Dr. Ida Rogue in [place] with continued symptoms and was set up for [facility]. This demonstrated 3v CAD with severe prox LAD stenosis and ostial RCA. She was referred for CABG. Intraoperative TEE demonstrated mild AS and mod to severe AI. She underwent CABG with Dr. Cyndia Bent 10/12/13 (L-LAD, S-RCA) + AVR with a bioprosthetic valve (21 mm [employer]). Echo June 2015 with normal LVEF and normal AVR. She was seen in our office 12/03/13 by Richardson Dopp, PA-C and still having chest pain. Troponin was negative. D-dimer was elevated. Pt was sent to [facility] for CTA chest to exclude PE that evening. NO PE on chest CT but noted to have small left pleural effusion. Admitted to [facility] 07/26/14 with chest pain. Stress myoview February 2016 with no ischemia. She does not tolerate statins or Zetia. Seen in lipid clinic Feb 2016 and tried on low dose Pravachol but did not tolerate. She was admitted to [facility] December 2017 with unstable angina. Cardiac cath with patent LIMA to LAD and SVG to RCA but progression of stent restenosis in the proximal circumflex which was treated with cutting balloon angioplasty. She had Second degree AV block noted in the hospital. She did not tolerate Brilinta and was placed onEffient. She had continued dizziness post discharge and cardiac monitor showed sinus with 1st degree AV block, second degree AV block without diary notation of symptoms during these events. She was seen by Dr. Caryl Comes who did not feel that her bradycardia and heart block was related to her dizziness. He suggested that her dyspnea may be related to chronotropic incompetence and suggested stress testing.  Exercise stress test on 07/27/16 with 1 minute 36 seconds of exercise stopped due to dizziness. Heart rate responded normally to exercise.Her trazodone was stopped and her dizziness resolved. Effient was stopped in September 2018 due to GI bleeding/anemia. She was seen in our office in January 2019 with c/o rapid heart rate with minimal exercise. 48 hour cardiac monitor showed sinus rhythm with bradycardia, rates as low as 52 bpm. Rare PACs, Rare PVCs. Echo January 2019 with LVEF=60-65%. AVR working wellbut the gradients across the valve were increased from the prior study.  Pt was seen by Dr. Angelena Form in clinic on 09/19/17 and complained of exertional chest pain/ pressure and severe dyspnea with minimal exertion. Pt noted symptoms felt similar to previous angina. She has also felt very dizzy but no syncope/ near syncope and also notes fatigue. Subsequently, pt was directly admitted to [facility] for left heart catheterization.  Hospital Course     Pt was admitted to telemetry. Cardiac enzymes were cycled and negative x 3. EKG showed SR w/ 1st degree AV  block. On 09/20/17, she underwent LHC and was found to have severe native 3 vessel CAD w/ known total occlusion of the RCA, severe proximal LAD stenosis, and continued patency of the LCx w/ mild-in-stent restenosis. Continued patency of the LIMA-LAD and SVG to RCA. Dr. Burt Knack was unable to cross the aortic valve in the cath lab (h/o bioprosthetic aortic valve stenosis). She left the cath lab in stable condition and 2D echo was done to assess status of AV. This showed normal functioning aortic valve. There was no stenosis and no regurgitation. Mean gradient 17 mm Hg. LVEF was normal, 55-60%.   She was monitored post cath and had no complications. Continued medical therapy was recommended for CAD. She was continued on ASA and amlodipine. No BB given prior h/o bradycardia. She is also statin intolerant. She has tried atorvastatin and rosuvastatin. She is also  intolerant to Zetia. FLP shows elevated LDL at 110 mg/DL. LDL goal given CAD history is < 70 mg/dL. Referral will be placed for lipid clinic appt to consider PCSK9 inhibitor therapy. We also recommend PCP consider addition of Jardiance, as her Hgb A1c is 8.2. Discharge summary will be routed to her PCP with recommendations. Post hospital f/u will be arranged with Dr. Angelena Form or APP.    Consultants: none    _____________  Discharge Vitals Blood pressure 139/64, pulse 71, temperature 99.6 F (37.6 C), temperature source Oral, resp. rate 15, height 5\' 6"  (1.676 m), weight 190 lb 1.6 oz (86.2 kg), SpO2 97 %.  Filed Weights   09/19/17 1530 09/20/17 0400  Weight: 192 lb 9.6 oz (87.4 kg) 190 lb 1.6 oz (86.2 kg)    Labs & Radiologic Studies    CBC Recent Labs    09/19/17 1605  WBC 11.4*  NEUTROABS 8.1*  HGB 9.8*  HCT 32.2*  MCV 76.1*  PLT 527   Basic Metabolic Panel Recent Labs    09/19/17 1605 09/20/17 0338  NA 137 137  K 3.9 4.1  CL 104 105  CO2 22 22  GLUCOSE 132* 183*  BUN 11 11  CREATININE 0.90 0.81  CALCIUM 8.4* 8.2*   Liver Function Tests Recent Labs    09/19/17 1605  AST 13*  ALT 12*  ALKPHOS 108  BILITOT 0.4  PROT 6.4*  ALBUMIN 2.9*   No results for input(s): LIPASE, AMYLASE in the last 72 hours. Cardiac Enzymes Recent Labs    09/19/17 1605 09/19/17 2121 09/20/17 0338  TROPONINI <0.03 <0.03 <0.03   BNP Invalid input(s): POCBNP D-Dimer No results for input(s): DDIMER in the last 72 hours. Hemoglobin A1C Recent Labs    09/19/17 1605  HGBA1C 8.2*   Fasting Lipid Panel Recent Labs    09/20/17 0338  CHOL 173  HDL 22*  LDLCALC 110*  TRIG 207*  CHOLHDL 7.9   Thyroid Function Tests No results for input(s): TSH, T4TOTAL, T3FREE, THYROIDAB in the last 72 hours.  Invalid input(s): FREET3 _____________  No results found. Disposition   Pt is being discharged home today in good condition.  Follow-up Plans & Appointments    Follow-up  Information    [name], MD Follow up.   Specialty:  Cardiology Why:  our office will call you with a hospital follow-up visit with Dr. Angelena Form or his PA/NP and will also arrange an appt for you to meet with our pharmacist to discuss new medication for cholesterol  Contact information: [place]. [address] [place] [address] [phone]  Glendon Axe, MD Follow up in 1 week(s).   Specialty:  Internal Medicine Contact information: [place] [address] [phone]          Discharge Instructions    Diet - low sodium heart healthy   Complete by:  As directed    Increase activity slowly   Complete by:  As directed       Discharge Medications   Allergies as of 09/20/2017      Reactions   Amitriptyline Other (See Comments)   Unknown reaction   Benadryl [diphenhydramine] Shortness Of Breath   Demerol [meperidine] Other (See Comments)   Unknown reaction   Gabapentin Other (See Comments)   Unknown reaction   Loratadine Other (See Comments)   Unknown reaction   Meperidine Hcl Other (See Comments)   Unknown reaction   Mirtazapine Other (See Comments)   Unknown reaction   Olanzapine Other (See Comments)   Unknown reaction   Voltaren [diclofenac Sodium] Shortness Of Breath   Zetia [ezetimibe] Other (See Comments)   Weakness in legs, shakiness all over   Ativan [lorazepam] Other (See Comments)   Causes double vision at highter than .5 mg dose   Atorvastatin Other (See Comments)   Muscle aches and weakness   Budesonide-formoterol Fumarate Other (See Comments)   Shakiness, tremors   Bupropion Hcl Other (See Comments)   "cloud over me" depression   Caffeine Other (See Comments)   jitters   Codeine Sulfate Other (See Comments)   Makes chest hurt like a heart attack   Lisinopril Cough   Metformin Nausea And Vomiting   Mometasone Furoate Nausea And Vomiting   Morphine Sulfate Other (See  Comments)   Chest pain like a heart attack   Other Other (See Comments)   Beta Blockers, reaction shortness of breath   Oxycodone-acetaminophen Nausea And Vomiting   Pioglitazone Other (See Comments)   Cannot take because of risk of bladder cancer   Propoxyphene N-acetaminophen Nausea And Vomiting   Rosuvastatin Other (See Comments)   Muscle aches and weakness   Shellfish Allergy Diarrhea   Ticagrelor    Other reaction(s): Other (See Comments) "slowed heart rate" & chest pain   Tramadol Nausea Only   Trazodone And Nefazodone Nausea And Vomiting   Venlafaxine Other (See Comments)   Unknown reaction   Zolpidem Tartrate Other (See Comments)    Jittery, diarrhea   Latex Rash      Medication List    TAKE these medications   albuterol 108 (90 Base) MCG/ACT inhaler Commonly known as:  PROAIR HFA Inhale 2 puffs into the lungs every 4 (four) hours as needed for wheezing or shortness of breath.   amLODipine 2.5 MG tablet Commonly known as:  NORVASC Take 2.5 mg by mouth daily.   aspirin EC 81 MG tablet Take 1 tablet (81 mg total) by mouth daily.   CALTRATE 600 PLUS-VIT D PO Take 2 tablets by mouth 2 (two) times daily.   FLUoxetine 40 MG capsule Commonly known as:  PROZAC Take 40 mg by mouth daily.   insulin NPH-regular Human (70-30) 100 UNIT/ML injection Commonly known as:  NOVOLIN 70/30 Inject 70 Units into the skin 2 (two) times daily.   levothyroxine 137 MCG tablet Commonly known as:  SYNTHROID, LEVOTHROID Take by mouth.   nitroGLYCERIN 0.4 MG SL tablet Commonly known as:  NITROSTAT Place 1 tablet (0.4 mg total) under the tongue every 5 (five) minutes as needed for chest pain.  ondansetron 4 MG tablet Commonly known as:  ZOFRAN Take 1 tablet (4 mg total) by mouth every 6 (six) hours as needed for nausea.   pantoprazole 20 MG tablet Commonly known as:  PROTONIX Take 20 mg by mouth daily.         Outstanding Labs/Studies     Duration of Discharge  Encounter   Greater than 30 minutes including physician time.  Signed,  Lyda Jester, PA-C 09/20/2017, 5:16 PM

## 2017-09-20 NOTE — Care Management Obs Status (Signed)
[facility] NOTIFICATION   Patient Details  Name: [name] MRN: [id number] Date of Birth: 1945/10/27   Medicare Observation Status Notification Given:  Yes    Bethena Roys, RN 09/20/2017, 12:58 PM

## 2017-09-20 NOTE — Interval H&P Note (Signed)
Cath Lab Visit (complete for each Cath Lab visit)  Clinical Evaluation Leading to the Procedure:   ACS: Yes.    Non-ACS:    Anginal Classification: CCS IV  Anti-ischemic medical therapy: Minimal Therapy (1 class of medications)  Non-Invasive Test Results: No non-invasive testing performed  Prior CABG: Previous CABG      History and Physical Interval Note:  09/20/2017 9:32 AM  [name]  has presented today for surgery, with the diagnosis of unstable angina  The various methods of treatment have been discussed with the patient and family. After consideration of risks, benefits and other options for treatment, the patient has consented to  Procedure(s): LEFT HEART CATH AND CORS/GRAFTS ANGIOGRAPHY (N/A) as a surgical intervention .  The patient's history has been reviewed, patient examined, no change in status, stable for surgery.  I have reviewed the patient's chart and labs.  Questions were answered to the patient's satisfaction.     Sherren Mocha

## 2017-09-23 ENCOUNTER — Encounter: Payer: Self-pay | Admitting: Internal Medicine

## 2017-09-23 ENCOUNTER — Ambulatory Visit: Payer: Medicare HMO | Admitting: Internal Medicine

## 2017-09-23 VITALS — BP 136/82 | HR 89 | Ht 66.0 in | Wt 189.0 lb

## 2017-09-23 DIAGNOSIS — J069 Acute upper respiratory infection, unspecified: Secondary | ICD-10-CM

## 2017-09-23 DIAGNOSIS — R0600 Dyspnea, unspecified: Secondary | ICD-10-CM

## 2017-09-23 DIAGNOSIS — J9601 Acute respiratory failure with hypoxia: Secondary | ICD-10-CM | POA: Diagnosis not present

## 2017-09-23 MED ORDER — ALBUTEROL SULFATE (2.5 MG/3ML) 0.083% IN NEBU: 2.5000 mg | INHALATION_SOLUTION | Freq: Four times a day (QID) | RESPIRATORY_TRACT | 12 refills | Status: DC | PRN

## 2017-09-23 MED FILL — Heparin Sod (Porcine)-NaCl IV Soln 1000 Unit/500ML-0.9%: INTRAVENOUS | Qty: 1000 | Status: AC

## 2017-09-23 NOTE — Progress Notes (Signed)
[facility] Consultation      Date: 09/23/2017,   MRN# [id number] [name] February 07, 1946 Code Status:  Code Status History    Date Active Date Inactive Code Status Order ID Comments User Context   07/24/2014 12:10 AM 07/24/2014  7:45 PM Full Code [id number]  Manus Gunning, MD Inpatient   10/12/2013  8:32 PM 10/15/2013  7:36 PM Full Code [id number]  John Giovanni, PA-C Inpatient   10/09/2013  7:30 PM 10/12/2013  8:32 PM Full Code [id number]  [name], MD Inpatient   10/08/2013 11:12 PM 10/09/2013  7:30 PM Full Code [id number]  Stephani Police, MD ED     Hosp day:@LENGTHOFSTAYDAYS @ Referring MD: @ATDPROV @     PCP:      Admission                  Current  [name] is a 72 y.o. old female seen in consultation for SOB and cough.     CHIEF COMPLAINT:   Cough and wheezing   HISTORY OF PRESENT ILLNESS  72 yo white female with long standing smoking history apporx 30 pack year with COPD. She normally sees Dr. 64. She comes in today because she has been having dyspnea and chest tightness for about 3 weeks. She takes ventolin twice per day and it did not help.  She is not smoking. She has allergies to multiple meds and symbicort.  She saw her regular doctor with above symptoms, got a course of abx and prednisone which has not helped. She notes that her wheezing is mostly at night.    Has h/o OSA and noncomplaint due to claustrophobia Takes allergy shots given by ENT  Quit tobacco in 2015 when she had her CABG worked in 2016 for 10 years     Current Medication:  Current Outpatient Medications:  .  albuterol (PROAIR HFA) 108 (90 Base) MCG/ACT inhaler, Inhale 2 puffs into the lungs every 4 (four) hours as needed for wheezing or shortness of breath., Disp: 1 Inhaler, Rfl: 2 .  amLODipine (NORVASC) 2.5 MG tablet, Take 2.5 mg by mouth daily., Disp: , Rfl:  .  aspirin EC 81 MG tablet, Take 1 tablet (81 mg total) by mouth daily., Disp: 90 tablet, Rfl: 3 .   Calcium-Vitamin D (CALTRATE 600 PLUS-VIT D PO), Take 2 tablets by mouth 2 (two) times daily. , Disp: , Rfl:  .  FLUoxetine (PROZAC) 40 MG capsule, Take 40 mg by mouth daily. , Disp: , Rfl:  .  insulin NPH-regular Human (NOVOLIN 70/30) (70-30) 100 UNIT/ML injection, Inject 70 Units into the skin 2 (two) times daily., Disp: , Rfl:  .  levothyroxine (SYNTHROID, LEVOTHROID) 137 MCG tablet, Take by mouth., Disp: , Rfl:  .  nitroGLYCERIN (NITROSTAT) 0.4 MG SL tablet, Place 1 tablet (0.4 mg total) under the tongue every 5 (five) minutes as needed for chest pain., Disp: 25 tablet, Rfl: 1 .  ondansetron (ZOFRAN) 4 MG tablet, Take 1 tablet (4 mg total) by mouth every 6 (six) hours as needed for nausea., Disp: 20 tablet, Rfl: 0 .  pantoprazole (PROTONIX) 20 MG tablet, Take 20 mg by mouth daily., Disp: , Rfl:     ALLERGIES   Amitriptyline; Benadryl [diphenhydramine]; Demerol [meperidine]; Gabapentin; Loratadine; Meperidine hcl; Mirtazapine; Olanzapine; Voltaren [diclofenac sodium]; Zetia [ezetimibe]; Ativan [lorazepam]; Atorvastatin; Budesonide-formoterol fumarate; Bupropion hcl; Caffeine; Codeine sulfate; Lisinopril; Metformin; Mometasone furoate; Morphine sulfate; Other; Oxycodone-acetaminophen; Pioglitazone; Propoxyphene n-acetaminophen; Rosuvastatin; Shellfish allergy; Ticagrelor; Tramadol; Trazodone and nefazodone;  Venlafaxine; Zolpidem tartrate; and Latex     REVIEW OF SYSTEMS   Review of Systems  Constitutional: Negative for chills, fever, malaise/fatigue and weight loss.  HENT: Negative for congestion and sore throat.   Respiratory: Positive for shortness of breath and wheezing. Negative for cough, hemoptysis and sputum production.   Cardiovascular: Negative for leg swelling.  Musculoskeletal: Negative.   Skin: Negative for rash.  Neurological: Negative for headaches.  Psychiatric/Behavioral: The patient is nervous/anxious.   All other systems reviewed and are negative.    VS: There were  no vitals taken for this visit.     PHYSICAL EXAM  Physical Exam  Constitutional: She is oriented to person, place, and time. She appears well-developed and well-nourished. No distress.  HENT:  Mouth/Throat: No oropharyngeal exudate.  Eyes: Pupils are equal, round, and reactive to light. EOM are normal.  Cardiovascular: Normal rate, regular rhythm and normal heart sounds.  No murmur heard. Pulmonary/Chest: No stridor. No respiratory distress. She has no wheezes. She has no rales.  Musculoskeletal: Normal range of motion. She exhibits no edema.  Neurological: She is alert and oriented to person, place, and time.  Skin: Skin is warm. She is not diaphoretic.  Psychiatric: She has a normal mood and affect.           IMAGING    No results found.   CXR on 2/14 images reveiwed 09/23/2017   ASSESSMENT/PLAN   72 yo white female with long standing smoking history with findings suggestive of Moderate -Severe COPD Gold Stage B  1.started Breo last visit-patient refuses to take inhaled steroids at this time 2.albuterol 2 puffs daily 3.Lung Nodule stable -follow up CT chest in 1 year 4.rt breast nodule-recommend follow up with Dr.Sankar  Follow up in 3 months  I have personally obtained a history, examined the patient, evaluated laboratory and independently reviewed imaging results, formulated the assessment and plan and placed orders.  The Patient requires high complexity decision making for assessment and support, frequent evaluation and titration of therapies, application of advanced monitoring technologies and extensive interpretation of multiple databases. Patient/Family are satisfied with Plan of action and management. All questions answered  Corrin Parker, M.D.  [facility]  Medical Director [place] Director [facility] Cardio-Pulmonary Department

## 2017-09-23 NOTE — Patient Instructions (Signed)
Take albuterol 4 times daily as needed. Continue until you feel better. If not better in 1 week then call back.

## 2017-09-26 ENCOUNTER — Telehealth: Payer: Self-pay | Admitting: Cardiology

## 2017-09-26 NOTE — Telephone Encounter (Signed)
Pt's husband called stating that she has continued to feel unwell since she was discharged last week. Was seen by her pulmonary MD recently as well. Has been having episodes of elevated HRs over the past several days. Rates around 100-110 while at rest, with palpitations at times. 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 Not on BB 2/2 to bradycardia. Occasional chest tightness. This evening became concerned because rate has been up to 118 and she had some chest tightness, took 1SL nitro with improvement. Symptoms seem to be improving. I advised that if rates remain elevated or chest pain returns she should go to the ED. Also offered to send message to the office about moving up her appt to see if availability tomorrow and possibly place a 30 day monitor, as she has worn 48hr holter in the past with PVCs, and PACs. Husband and patient were agreeable to this plan and thanked me for the follow up call.   Reino Bellis NP

## 2017-09-27 ENCOUNTER — Telehealth: Payer: Self-pay | Admitting: Cardiovascular Disease

## 2017-09-27 DIAGNOSIS — R000 Tachycardia, unspecified: Secondary | ICD-10-CM

## 2017-09-27 DIAGNOSIS — R079 Chest pain, unspecified: Secondary | ICD-10-CM

## 2017-09-27 NOTE — Telephone Encounter (Signed)
Recent admission with stable CAD by cath, normal LV function and normally functioning AVR. I don't have any other suggestions right now. Can we set her up for a 30 day monitor Pat? Thanks, chris

## 2017-09-27 NOTE — Telephone Encounter (Signed)
I discussed pt with Dr. Lovena Le (DOD).  Dr. Angelena Form has responded to previous phone note and recommended 30 day event monitor.  Dr. Lovena Le is in agreement with this plan. I spoke with pt and gave her information regarding monitor.  I told her schedulers would contact her with appointment information. I advised her to go to ED if symptoms continue.

## 2017-09-27 NOTE — Telephone Encounter (Signed)
See note from on call provider last evening. I spoke with pt who reports she had chest pain last night. She checked pulse and it was elevated.  Was encouraged to go to ED if symptoms continued.  She reports chest pain continued until she went to sleep around 1 AM.  Today she states she is sitting in the recliner and reports heart rates of 107,114 and 125.  No shortness of breath when sitting but does become short of breath when up walking. She is concerned because heart rate is normally in 70's and she doesn't understand why it continues to go up. When I asked her why she checked her heart rate this morning she states she felt funny. States she does not have chest pain today like last night but chest does feel a little heavy.    Was recently started on albuterol inhaler by pulmonary.  States heart rate issue has been going on for a month or so. Pt does not want to go to ED.  Will review with provider in office.

## 2017-09-27 NOTE — Telephone Encounter (Signed)
New Message   Pt's husband is calling back, states the pt is having the same symptom but refuses the ER and would like to speak to the nurse

## 2017-10-07 ENCOUNTER — Encounter: Payer: Self-pay | Admitting: Cardiology

## 2017-10-08 ENCOUNTER — Emergency Department
Admission: EM | Admit: 2017-10-08 | Discharge: 2017-10-08 | Disposition: A | Discharge status: Home / Self Care | Payer: Medicare HMO | Attending: Emergency Medicine | Admitting: Emergency Medicine

## 2017-10-08 ENCOUNTER — Encounter: Payer: Self-pay | Admitting: *Deleted

## 2017-10-08 ENCOUNTER — Encounter: Payer: Self-pay | Admitting: Cardiology

## 2017-10-08 ENCOUNTER — Ambulatory Visit: Payer: Medicare HMO | Admitting: Cardiology

## 2017-10-08 ENCOUNTER — Other Ambulatory Visit: Payer: Self-pay

## 2017-10-08 ENCOUNTER — Emergency Department: Payer: Medicare HMO

## 2017-10-08 VITALS — BP 126/72 | HR 69 | Ht 66.0 in | Wt 190.8 lb

## 2017-10-08 DIAGNOSIS — J45909 Unspecified asthma, uncomplicated: Secondary | ICD-10-CM | POA: Insufficient documentation

## 2017-10-08 DIAGNOSIS — Z91040 Latex allergy status: Secondary | ICD-10-CM | POA: Insufficient documentation

## 2017-10-08 DIAGNOSIS — R509 Fever, unspecified: Secondary | ICD-10-CM | POA: Diagnosis present

## 2017-10-08 DIAGNOSIS — Z951 Presence of aortocoronary bypass graft: Secondary | ICD-10-CM | POA: Insufficient documentation

## 2017-10-08 DIAGNOSIS — Z7982 Long term (current) use of aspirin: Secondary | ICD-10-CM | POA: Diagnosis not present

## 2017-10-08 DIAGNOSIS — I2510 Atherosclerotic heart disease of native coronary artery without angina pectoris: Secondary | ICD-10-CM | POA: Diagnosis not present

## 2017-10-08 DIAGNOSIS — Z79899 Other long term (current) drug therapy: Secondary | ICD-10-CM | POA: Insufficient documentation

## 2017-10-08 DIAGNOSIS — R079 Chest pain, unspecified: Secondary | ICD-10-CM

## 2017-10-08 DIAGNOSIS — R5381 Other malaise: Secondary | ICD-10-CM

## 2017-10-08 DIAGNOSIS — Z87891 Personal history of nicotine dependence: Secondary | ICD-10-CM | POA: Diagnosis not present

## 2017-10-08 DIAGNOSIS — I10 Essential (primary) hypertension: Secondary | ICD-10-CM | POA: Insufficient documentation

## 2017-10-08 DIAGNOSIS — E782 Mixed hyperlipidemia: Secondary | ICD-10-CM | POA: Diagnosis not present

## 2017-10-08 LAB — TROPONIN I: Troponin I: <0.03 ng/mL (ref <0.03)

## 2017-10-08 LAB — LACTIC ACID, PLASMA: LACTIC ACID, VENOUS: 1.6 mmol/L (ref 0.5–1.9)

## 2017-10-08 LAB — URINALYSIS, COMPLETE (UACMP) WITH MICROSCOPIC
BILIRUBIN URINE: NEGATIVE
Bacteria, UA: NONE SEEN
GLUCOSE, UA: NEGATIVE mg/dL
HGB URINE DIPSTICK: NEGATIVE
Ketones, ur: NEGATIVE mg/dL
LEUKOCYTES UA: NEGATIVE
Nitrite: NEGATIVE
PH: 6 (ref 5.0–8.0)
Protein, ur: NEGATIVE mg/dL
SPECIFIC GRAVITY, URINE: 1.009 (ref 1.005–1.030)

## 2017-10-08 LAB — BASIC METABOLIC PANEL
ANION GAP: 9 (ref 5–15)
BUN: 16 mg/dL (ref 6–20)
CHLORIDE: 100 mmol/L — AB (ref 101–111)
CO2: 25 mmol/L (ref 22–32)
Calcium: 8.2 mg/dL — ABNORMAL LOW (ref 8.9–10.3)
Creatinine, Ser: 0.96 mg/dL (ref 0.44–1.00)
GFR, EST NON AFRICAN AMERICAN: 57 mL/min — AB (ref >60)
Glucose, Bld: 175 mg/dL — ABNORMAL HIGH (ref 65–99)
POTASSIUM: 4.2 mmol/L (ref 3.5–5.1)
SODIUM: 134 mmol/L — AB (ref 135–145)

## 2017-10-08 LAB — CBC
HEMATOCRIT: 32 % — AB (ref 35.0–47.0)
Hemoglobin: 10.3 g/dL — ABNORMAL LOW (ref 12.0–16.0)
MCH: 23.5 pg — ABNORMAL LOW (ref 26.0–34.0)
MCHC: 32.3 g/dL (ref 32.0–36.0)
MCV: 72.6 fL — ABNORMAL LOW (ref 80.0–100.0)
Platelets: 208 10*3/uL (ref 150–440)
RBC: 4.41 MIL/uL (ref 3.80–5.20)
RDW: 17.6 % — AB (ref 11.5–14.5)
WBC: 12.9 10*3/uL — AB (ref 3.6–11.0)

## 2017-10-08 NOTE — ED Provider Notes (Signed)
[facility] Emergency Department Provider Note   ____________________________________________   First MD Initiated Contact with Patient 10/08/17 [phone]     (approximate)  I have reviewed the triage vital signs and the nursing notes.   HISTORY  Chief Complaint Fever    HPI [name] is a 72 y.o. female who presents to the ED from home with a chief complaint of low-grade fever.  Patient has a history of COPD, insulin-dependent diabetes, CAD status post CABG, hypertension who was recently hospitalized 3 weeks ago for unstable angina.  Since that time she has been experiencing episodes of rapid heart rate.  Has seen her pulmonologist for shortness of breath episodes.  However, she is sensitive to multiple medications and was unwilling to use a steroid inhaler which her pulmonologist prescribed.  She has made several phone calls to her cardiologist for the palpitations and was scheduled to wear a Holter monitor.  However, patient tells me she canceled that because she has worn a Holter twice before and felt like it would not help her.  States her temperature has been running 102 F for the past several months but tonight her temperature was 100.9 F.  She took aspirin for the temperature.  Reports chronic shortness of breath worsened with exertion.  States her albuterol nebulizers are not helping.  She was working in the garden last evening and she began to experience shortness of breath, point tenderness in her right upper chest with nausea.  At this time she denies all symptoms.  Denies cough, congestion, abdominal pain, vomiting, dysuria, diarrhea.  Denies recent travel or trauma.   Past Medical History:  Diagnosis Date  . 1st degree AV block   . ACE-inhibitor cough   . Allergic rhinitis   . Anemia    iron deficiency anemia  . Anxiety   . Aortic ectasia (HCC)    a. CT abd in 12/2016 incidentally noted aortic atherosclerosis and infrarenal abdominal aortic  ectasia measuring as large as 2.7 cm with recommendation to repeat US in 2023.  Marland Kitchen Arthritis   . Asthma   . Cataract   . Chronic depression   . Chronic diastolic CHF (congestive heart failure) ([place])   . Chronic headache   . COPD (chronic obstructive pulmonary disease) ([place])   . Coronary artery disease    a. DES to RCA and mid Cx 2009. b. CABG and bioprosthetic AVR May 2015. c. cutting balloon to prox Cx in 05/2016  . Diabetes mellitus    type 2  . Diverticulitis of colon   . Essential hypertension   . GERD (gastroesophageal reflux disease)   . Hearing loss   . History of blood transfusion 2013  . History of prosthetic aortic valve replacement   . HOH (hard of hearing)   . Hypercholesterolemia    intolerance of statins and niaspan  . Mobitz type 1 second degree AV block   . OSA (obstructive sleep apnea)    mild, intolerant of cpap  . PAD (peripheral artery disease) ([place])    a. atherosclerosis by CT abd 12/2016 in [place].  Marland Kitchen PONV (postoperative nausea and vomiting)   . Statin intolerance   . Thyroid disease     Patient Active Problem List   Diagnosis Date Noted  . Depression, major, single episode, complete remission ([place]) 04/11/2017  . 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 Aortic ectasia, abdominal ([place]) 01/01/2017  . GAD (generalized anxiety disorder) 09/26/2016  . Acute posthemorrhagic anemia 08/24/2016  . Acute  respiratory failure with hypoxia ([place]) 08/24/2016  . Acute diastolic CHF (congestive heart failure) ([place]) 08/24/2016  . Hyponatremia 08/24/2016  . Leukocytosis 08/24/2016  . Fever 08/24/2016  . Diarrhea 08/24/2016  . Generalized weakness 08/24/2016  . Acute diverticulitis 08/16/2016  . Lower GI bleed   . Ataxia 08/09/2016  . Dizziness 08/09/2016  . Staggering gait 08/09/2016  . NSTEMI (non-ST elevated myocardial infarction) ([place])   . AVB (atrioventricular block)   . Atypical chest pain   . Unstable angina ([place]) 05/25/2016  . Palpitations 05/25/2016  .  PUD - recent Rx for H.Pylori 05/25/2016  . Stenosis of coronary artery stent   . Recurrent major depressive disorder, in partial remission ([place]) 07/29/2015  . Parotiditis 04/25/2015  . Viral URI 03/24/2015  . Hypoglycemia 08/25/2014  . Nausea without vomiting 08/25/2014  . Dyslipidemia-statin ontol   . Long-term insulin use ([place]) 03/31/2014  . Aortic valve disorder 11/11/2013  . 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 Coronary Artery Disease s/p CABG 10/2013 10/10/2013  . Other malaise and fatigue 09/11/2013  . Alopecia 03/06/2013  . Insomnia 02/06/2013  . Gastroesophageal reflux disease without esophagitis 01/19/2013  . Dysphagia, unspecified(787.20) 01/19/2013  . Dyspnea 11/17/2012  . Weakness 11/17/2012  . Abnormal MRI of head 09/05/2012  . Depression with anxiety 03/21/2012  . Extrinsic asthma 11/12/2011  . Anemia 06/07/2011  . Chronic obstructive pulmonary disease ([place]) 01/25/2010  . Acquired hypothyroidism 08/10/2009  . MICROSCOPIC HEMATURIA 05/07/2009  . GOITER 03/02/2009  . Essential hypertension 01/06/2009  . MEMORY LOSS 06/23/2008  . Obstructive sleep apnea 12/25/2007  . Insulin dependent diabetes mellitus ([place]) 09/09/2006  . ALLERGIC RHINITIS 09/09/2006  . DIVERTICULOSIS, COLON 09/09/2006    Past Surgical History:  Procedure Laterality Date  . ABDOMINAL HYSTERECTOMY    . ABDOMINAL HYSTERECTOMY W/ PARTIAL VAGINACTOMY    . AORTIC VALVE REPLACEMENT N/A 10/12/2013   Procedure: AORTIC VALVE REPLACEMENT (AVR);  Surgeon: [name], MD;  Location: [facility];  Service: Open Heart Surgery;  Laterality: N/A;  . [place]  . BARTHOLIN GLAND CYST EXCISION    . BLADDER SUSPENSION    . BREAST BIOPSY Bilateral 09/11/2000   neg  . BREAST BIOPSY Left 07/24/2010   neg  . BREAST CYST EXCISION  1988   bilateral nonmalignant tumors, x3  . CARDIAC CATHETERIZATION    . CARDIAC CATHETERIZATION N/A 05/25/2016   Procedure: Coronary Balloon Angioplasty;  Surgeon: Leonie Man,  MD;  Location: [facility];  Service: Cardiovascular;  Laterality: N/A;  . CARDIAC CATHETERIZATION N/A 05/25/2016   Procedure: Coronary/Graft Angiography;  Surgeon: Leonie Man, MD;  Location: [facility];  Service: Cardiovascular;  Laterality: N/A;  . CATARACT EXTRACTION W/ INTRAOCULAR LENS  IMPLANT, BILATERAL    . CHOLECYSTECTOMY  2001  . COLECTOMY     lap sigmoid  . COLONOSCOPY  2014   polyps found, 2 clamped off.  . CORONARY ANGIOPLASTY  10/29/2007   Prox RCA & Mid Cx.  . CORONARY ARTERY BYPASS GRAFT N/A 10/12/2013   Procedure: CORONARY ARTERY BYPASS GRAFT TIMES TWO;  Surgeon: [name], MD;  Location: [facility];  Service: Open Heart Surgery;  Laterality: N/A;  . CORONARY/GRAFT ANGIOGRAPHY N/A 09/20/2017   Procedure: CORONARY/GRAFT ANGIOGRAPHY;  Surgeon: Sherren Mocha, MD;  Location: [facility];  Service: Cardiovascular;  Laterality: N/A;  . LEFT HEART CATHETERIZATION WITH CORONARY ANGIOGRAM N/A 10/09/2013   Procedure: LEFT HEART CATHETERIZATION WITH CORONARY ANGIOGRAM;  Surgeon: [name], MD;  Location: [facility];  Service: Cardiovascular;  Laterality: N/A;  . STERNAL WIRES REMOVAL N/A 04/13/2014   Procedure: STERNAL WIRES REMOVAL;  Surgeon: [name], MD;  Location: [facility];  Service: Thoracic;  Laterality: N/A;  . THYROIDECTOMY    . TONSILLECTOMY    . TUBAL LIGATION    . VAGINAL DELIVERY     3  . VISCERAL ARTERY INTERVENTION N/A 08/16/2016   Procedure: Visceral Artery Intervention;  Surgeon: Algernon Huxley, MD;  Location: [facility];  Service: Cardiovascular;  Laterality: N/A;    Prior to Admission medications   Medication Sig Start Date End Date Taking? Authorizing Provider  albuterol (PROAIR HFA) 108 (90 Base) MCG/ACT inhaler Inhale 2 puffs into the lungs every 4 (four) hours as needed for wheezing or shortness of breath. 07/17/16   Flora Lipps, MD  albuterol (PROVENTIL) (2.5 MG/3ML) 0.083% nebulizer solution Take 3 mLs (2.5 mg  total) by nebulization every 6 (six) hours as needed for wheezing or shortness of breath. 09/23/17   Laverle Hobby, MD  amLODipine (NORVASC) 2.5 MG tablet Take 2.5 mg by mouth daily.    [provider]  aspirin EC 81 MG tablet Take 1 tablet (81 mg total) by mouth daily. 01/28/17   Dunn, Nedra Hai, PA-C  Calcium-Vitamin D (CALTRATE 600 PLUS-VIT D PO) Take 2 tablets by mouth 2 (two) times daily.     [provider]  FLUoxetine (PROZAC) 40 MG capsule Take 40 mg by mouth daily.  07/29/15   [provider]  insulin NPH-regular Human (NOVOLIN 70/30) (70-30) 100 UNIT/ML injection Inject 70 Units into the skin 2 (two) times daily.    [provider]  levothyroxine (SYNTHROID, LEVOTHROID) 137 MCG tablet Take by mouth. 04/03/17   [provider]  nitroGLYCERIN (NITROSTAT) 0.4 MG SL tablet Place 1 tablet (0.4 mg total) under the tongue every 5 (five) minutes as needed for chest pain. 07/25/17   Lyda Jester M, PA-C  ondansetron (ZOFRAN) 4 MG tablet Take 1 tablet (4 mg total) by mouth every 6 (six) hours as needed for nausea. 08/24/16   Theodoro Grist, MD  pantoprazole (PROTONIX) 20 MG tablet Take 20 mg by mouth daily. 01/23/17   [provider]    Allergies Amitriptyline; Benadryl [diphenhydramine]; Demerol [meperidine]; Gabapentin; Loratadine; Meperidine hcl; Mirtazapine; Olanzapine; Voltaren [diclofenac sodium]; Zetia [ezetimibe]; Ativan [lorazepam]; Atorvastatin; Budesonide-formoterol fumarate; Bupropion hcl; Caffeine; Codeine sulfate; Lisinopril; Metformin; Mometasone furoate; Morphine sulfate; Other; Oxycodone-acetaminophen; Pioglitazone; Propoxyphene n-acetaminophen; Rosuvastatin; Shellfish allergy; Ticagrelor; Tramadol; Trazodone and nefazodone; Venlafaxine; Zolpidem tartrate; and Latex  Family History  Problem Relation Age of Onset  . Breast cancer Mother 32  . Hypertension Father   . Mesothelioma Father   . Asthma Father   . Stroke  Paternal Grandfather   . Heart disease Unknown   . Breast cancer Maternal Aunt   . Breast cancer Paternal Aunt     Social History Social History   Tobacco Use  . Smoking status: Former Smoker    Packs/day: 0.50    Years: 30.00    Pack years: 15.00    Types: Cigarettes    Last attempt to quit: 10/02/2013    Years since quitting: 4.0  . Smokeless tobacco: Never Used  Substance Use Topics  . Alcohol use: No  . Drug use: No    Review of Systems  Constitutional: Positive for low-grade temperature. Eyes: No visual changes. ENT: No sore throat. Cardiovascular: Positive for point tenderness in her right upper chest. Respiratory: Positive  for occasional shortness of breath. Gastrointestinal: No abdominal pain.  Positive for nausea, no vomiting.  No diarrhea.  No constipation. Genitourinary: Negative for dysuria. Musculoskeletal: Negative for back pain. Skin: Negative for rash. Neurological: Negative for headaches, focal weakness or numbness.   ____________________________________________   PHYSICAL EXAM:  VITAL SIGNS: ED Triage Vitals  Enc Vitals Group     BP 10/08/17 0210 (!) 161/96     Pulse Rate 10/08/17 0210 83     Resp 10/08/17 0210 (!) 21     Temp 10/08/17 0210 99 F (37.2 C)     Temp Source 10/08/17 0210 Oral     SpO2 10/08/17 0210 97 %     Weight --      Height --      Head Circumference --      Peak Flow --      Pain Score 10/08/17 0209 3     Pain Loc --      Pain Edu? --      Excl. in [place]? --     Constitutional: Alert and oriented. Well appearing and in no acute distress.  Anxious appearing. Eyes: Conjunctivae are normal. PERRL. EOMI. Head: Atraumatic. Nose: No congestion/rhinnorhea. Mouth/Throat: Mucous membranes are moist.  Oropharynx non-erythematous. Neck: No stridor.  Supple neck without meningismus. Cardiovascular: Normal rate, regular rhythm.  II/VI SEM.  Good peripheral circulation. Respiratory: Normal respiratory effort.  No retractions.  Lungs CTAB.  No wheezing. Gastrointestinal: Soft and nontender. No distention. No abdominal bruits. No CVA tenderness. Musculoskeletal: No lower extremity tenderness nor edema.  No joint effusions. Neurologic:  Normal speech and language. No gross focal neurologic deficits are appreciated.  Skin:  Skin is warm, dry and intact. No rash noted. Psychiatric: Mood and affect are anxious. Speech and behavior are normal.  ____________________________________________   LABS (all labs ordered are listed, but only abnormal results are displayed)  Labs Reviewed  BASIC METABOLIC PANEL - Abnormal; Notable for the following components:      Result Value   Sodium 134 (*)    Chloride 100 (*)    Glucose, Bld 175 (*)    Calcium 8.2 (*)    GFR calc non Af Amer 57 (*)    All other components within normal limits  CBC - Abnormal; Notable for the following components:   WBC 12.9 (*)    Hemoglobin 10.3 (*)    HCT 32.0 (*)    MCV 72.6 (*)    MCH 23.5 (*)    RDW 17.6 (*)    All other components within normal limits  URINALYSIS, COMPLETE (UACMP) WITH MICROSCOPIC - Abnormal; Notable for the following components:   Color, Urine YELLOW (*)    APPearance CLEAR (*)    Non Squamous Epithelial 0-5 (*)    All other components within normal limits  CULTURE, BLOOD (ROUTINE X 2)  CULTURE, BLOOD (ROUTINE X 2)  URINE CULTURE  TROPONIN I  LACTIC ACID, PLASMA  TROPONIN I  LACTIC ACID, PLASMA   ____________________________________________  EKG  ED ECG REPORT I, Mariette Cowley J, the attending physician, personally viewed and interpreted this ECG.   Date: 10/08/2017  EKG Time: 0214  Rate: 81  Rhythm: normal EKG, normal sinus rhythm  Axis: Normal  Intervals:none  ST&T Change: Nonspecific  ____________________________________________  RADIOLOGY  ED MD interpretation: No acute pulmonary consolidation  Official radiology report(s): Dg Chest 2 View  Result Date: 10/08/2017 CLINICAL DATA:  Low-grade fever  for several months. EXAM: CHEST - 2 VIEW COMPARISON:  08/05/2017  FINDINGS: Stable cardiomegaly with aortic atherosclerosis. Status post median sternotomy and aortic valvular replacement. No pulmonary consolidation. No overt pulmonary edema, effusion or pneumothorax. No acute osseous abnormality. IMPRESSION: Stable cardiomegaly with aortic atherosclerosis. No acute pulmonary consolidation. Electronically Signed   By: Ashley Royalty M.D.   On: 10/08/2017 03:44    ____________________________________________   PROCEDURES  Procedure(s) performed: None  Procedures  Critical Care performed: No  ____________________________________________   INITIAL IMPRESSION / ASSESSMENT AND PLAN / ED COURSE  As part of my medical decision making, I reviewed the following data within the electronic MEDICAL RECORD NUMBER History obtained from family, Nursing notes reviewed and incorporated, Labs reviewed, EKG interpreted, Old chart reviewed, Radiograph reviewed and Notes from prior ED visits   72 year old female with CAD, hypertension, insulin-dependent diabetes, COPD who presents with low-grade temperature.  Differential diagnosis includes but is not limited to sepsis, pneumonia, UTI, viral etiology, etc.   Initial laboratory results remarkable for mild leukocytosis, normal troponin.  Will repeat timed troponin, obtain blood cultures and lactic acid.  Awaiting results of urinalysis.  Patient is already eager for discharge because she has an appointment with her cardiologist at 830 this morning [place].  Clinical Course as of Oct 09 650  Tue Oct 08, 2017  0614 Updated patient and spouse on repeat negative troponin, normal lactate and unremarkable urinalysis.  Will discharge home with her scheduled follow-up with her cardiologist at 830 this morning.  Strict return precautions given.  Both verbalize understanding and agree with plan of care.   [JS]    Clinical Course User Index [JS] Paulette Blanch, MD      ____________________________________________   FINAL CLINICAL IMPRESSION(S) / ED DIAGNOSES  Final diagnoses:  Chest pain, unspecified type  [facility]     ED Discharge Orders    None       Note:  This document was prepared using Dragon voice recognition software and may include unintentional dictation errors.    Paulette Blanch, MD 10/08/17 [phone]

## 2017-10-08 NOTE — ED Notes (Signed)
Patient ambulatory to lobby with steady gait and NAD noted. Verbalized understanding of discharge instructions and follow-up care.  

## 2017-10-08 NOTE — Patient Instructions (Addendum)
Medication Instructions:   Your physician recommends that you continue on your current medications as directed. Please refer to the Current Medication list given to you today.   If you need a refill on your cardiac medications before your next appointment, please call your pharmacy.  Labwork: NONE ORDERED  TODAY    Testing/Procedures: NONE ORDERED  TODAY    Follow-Up:  WITH LIPID CLINIC FIRST AVIALABLE    Your physician wants you to follow-up in:  IN  6  MONTHS WITH DR  [place] will receive a reminder letter in the mail two months in advance. If you don't receive a letter, please call our office to schedule the follow-up appointment.      Any Other Special Instructions Will Be Listed Below (If Applicable).

## 2017-10-08 NOTE — ED Triage Notes (Signed)
Pt says she "has been having an elevated temperature around 99 for 2-3 months" and tonight her temp was 100.9, she took aspirin for the same. Shortness of breath when she is up and walking for a month, pulmonology prescribed a nebulizer but it is not helping.She says she was out working in the garden tonight and her breathing became worse tonight. Pain in the upper right chest and nausea.

## 2017-10-08 NOTE — Discharge Instructions (Addendum)
Please keep your appointment with your cardiologist this morning.  Blood cultures are pending.  You will be notified of any positive results.  Return to the ER for worsening symptoms, persistent vomiting, difficulty breathing or other concerns.

## 2017-10-08 NOTE — Progress Notes (Signed)
10/08/2017 [name]   [birth year]  [id number]  Primary Physician Glendon Axe, MD Primary Cardiologist: Dr. Angelena Form   Reason for Visit/CC: [facility] F/u for CP and Dyspnea.   HPI:  [name] presents to clinic today for post hospital f/u. She is a 72yo female with history of anemia, HTN, HLD, DM, COPD, CAD s/p prior stent placements followed by CABG and AVR in May 2015. She had a DES placed in the RCA and mid Circumflex in May 2009. She was admitted 10/2013 at [facility] with chest pain. Echo demonstrated normal LVEF, mod AI and mild AS. Myoview was negative for ischemia. She followed up with Dr. Ida Rogue in [place] with continued symptoms and was set up for [facility]. This demonstrated 3v CAD with severe prox LAD stenosis and ostial RCA. She was referred for CABG. Intraoperative TEE demonstrated mild AS and mod to severe AI. She underwent CABG with Dr. Cyndia Bent 10/12/13 (L-LAD, S-RCA) + AVR with a bioprosthetic valve (21 mm [employer]). Echo June 2015 with normal LVEF and normal AVR. She was seen in our office 12/03/13 by Richardson Dopp, PA-C and still having chest pain. Troponin was negative. D-dimer was elevated. Pt was sent to [facility] for CTA chest to exclude PE that evening. NO PE on chest CT but noted to have small left pleural effusion. Admitted to [facility] 07/26/14 with chest pain. Stress myoview February 2016 with no ischemia. She does not tolerate statins or Zetia. Seen in lipid clinic Feb 2016 and tried on low dose Pravachol but did not tolerate. She was admitted to [facility] December 2017 with unstable angina. Cardiac cath with patent LIMA to LAD and SVG to RCA but progression of stent restenosis in the proximal circumflex which was treated with cutting balloon angioplasty. She had Second degree AV block noted in the hospital. She did not tolerate Brilinta and was placed onEffient. She had continued dizziness post discharge and cardiac monitor showed sinus with 1st degree AV block, second degree  AV block without diary notation of symptoms during these events. She was seen by Dr. Caryl Comes who did not feel that her bradycardia and heart block was related to her dizziness. He suggested that her dyspnea may be related to chronotropic incompetence and suggested stress testing. Exercise stress test on 07/27/16 with 1 minute 36 seconds of exercise stopped due to dizziness. Heart rate responded normally to exercise.Her trazodone was stopped and her dizziness resolved. Effient was stopped in September 2018 due to GI bleeding/anemia. She was seen in our office in January 2019 with c/o rapid heart rate with minimal exercise. 48 hour cardiac monitor showed sinus rhythm with bradycardia, rates as low as 52 bpm. Rare PACs, Rare PVCs. Echo January 2019 with LVEF=60-65%. AVR working wellbut the gradients across the valve were increased from the prior study.  Pt was seen by Dr. Angelena Form in clinic on 09/19/17 and complained of exertional chest pain/ pressure and severe dyspnea with minimal exertion. Pt noted symptoms felt similar to previous angina. She has also felt very dizzy but no syncope/ near syncope and also notes fatigue. Subsequently, pt was directly admitted to [facility] for left heart catheterization.  Pt was admitted to telemetry. Cardiac enzymes were cycled and negative x 3. EKG showed SR w/ 1st degree AV block. On 09/20/17, she underwent LHC and was found to have severe native 3 vessel CAD w/ known total occlusion of the RCA, severe proximal LAD stenosis, and continued patency of the LCx w/ mild-in-stent restenosis. Continued patency  of the LIMA-LAD and SVG to RCA. There was no indication for PCI. Continued medical man agent of CAD was recommended. In regards to her aortic valve, Dr. Burt Knack was unable to cross the aortic valve in the cath lab (h/o bioprosthetic aortic valve stenosis). She left the cath lab in stable condition and 2D echo was done to assess status of AV. This showed normal functioning aortic valve.  There was no stenosis and no regurgitation. Mean gradient 17 mm Hg. LVEF was normal, 55-60%. Pt was discharged post cath observation.   She was continued on ASA and amlodipine. No BB given prior h/o bradycardia. She is also statin intolerant. She has tried atorvastatin and rosuvastatin. She is also intolerant to Zetia. FLP shows elevated LDL at 110 mg/DL. LDL goal given CAD history is <70 mg/dL. Referral to lipid clinic appt to consider PCSK9 inhibitor therapy was recommended.   She is back to clinic for f/u. She is here with her husband. She reports she was seen in the [facility] ED last night for low grade fever but was not willing to stay for completion of test results, given she had this appt today. ED note reviewed. A series of lab test were completed including UA, blood cultures, lactic acid and troponin, all of which were normal. CBC showed mild leukocytosis at 12.9. CXR negative x 2. She has f/u with her PCP tomorrow for further w/u.   She has had no post cath complications. Her femoral cath site is stable. She is currently CP free. No resting dyspnea. VSS. BP 126/72. HR 69 bpm. She has a lot of anxiety and multiple med allergies and intolerances.     Current Meds  Medication Sig  . albuterol (PROAIR HFA) 108 (90 Base) MCG/ACT inhaler Inhale 2 puffs into the lungs every 4 (four) hours as needed for wheezing or shortness of breath.  Marland Kitchen albuterol (PROVENTIL) (2.5 MG/3ML) 0.083% nebulizer solution Take 3 mLs (2.5 mg total) by nebulization every 6 (six) hours as needed for wheezing or shortness of breath.  Marland Kitchen amLODipine (NORVASC) 2.5 MG tablet Take 2.5 mg by mouth daily.  Marland Kitchen aspirin EC 81 MG tablet Take 1 tablet (81 mg total) by mouth daily.  . Calcium-Vitamin D (CALTRATE 600 PLUS-VIT D PO) Take 2 tablets by mouth 2 (two) times daily.   Marland Kitchen FLUoxetine (PROZAC) 40 MG capsule Take 40 mg by mouth daily.   . insulin NPH-regular Human (NOVOLIN 70/30) (70-30) 100 UNIT/ML injection Inject 70 Units into the skin 2  (two) times daily.  Marland Kitchen levothyroxine (SYNTHROID, LEVOTHROID) 137 MCG tablet Take 137 mcg by mouth daily.   . nitroGLYCERIN (NITROSTAT) 0.4 MG SL tablet Place 1 tablet (0.4 mg total) under the tongue every 5 (five) minutes as needed for chest pain.  Marland Kitchen ondansetron (ZOFRAN) 4 MG tablet Take 1 tablet (4 mg total) by mouth every 6 (six) hours as needed for nausea.  . pantoprazole (PROTONIX) 20 MG tablet Take 20 mg by mouth daily.   Allergies  Allergen Reactions  . Amitriptyline Other (See Comments)    Unknown reaction  . Benadryl [Diphenhydramine] Shortness Of Breath  . Demerol [Meperidine] Other (See Comments)    Unknown reaction  . Gabapentin Other (See Comments)    Unknown reaction  . Loratadine Other (See Comments)    Unknown reaction  . Meperidine Hcl Other (See Comments)    Unknown reaction  . Mirtazapine Other (See Comments)    Unknown reaction  . Olanzapine Other (See Comments)    Unknown  reaction   . Voltaren [Diclofenac Sodium] Shortness Of Breath  . Zetia [Ezetimibe] Other (See Comments)    Weakness in legs, shakiness all over  . Ativan [Lorazepam] Other (See Comments)    Causes double vision at highter than .5 mg dose  . Atorvastatin Other (See Comments)    Muscle aches and weakness  . Budesonide-Formoterol Fumarate Other (See Comments)    Shakiness, tremors  . Bupropion Hcl Other (See Comments)    "cloud over me" depression  . Caffeine Other (See Comments)    jitters  . Codeine Sulfate Other (See Comments)    Makes chest hurt like a heart attack  . Lisinopril Cough  . Metformin Nausea And Vomiting  . Mometasone Furoate Nausea And Vomiting  . Morphine Sulfate Other (See Comments)    Chest pain like a heart attack  . Other Other (See Comments)    Beta Blockers, reaction shortness of breath  . Oxycodone-Acetaminophen Nausea And Vomiting  . Pioglitazone Other (See Comments)    Cannot take because of risk of bladder cancer  . Propoxyphene N-Acetaminophen Nausea And  Vomiting  . Rosuvastatin Other (See Comments)    Muscle aches and weakness  . Shellfish Allergy Diarrhea  . Ticagrelor     Other reaction(s): Other (See Comments) "slowed heart rate" & chest pain  . Tramadol Nausea Only  . Trazodone And Nefazodone Nausea And Vomiting  . Venlafaxine Other (See Comments)    Unknown reaction  . Zolpidem Tartrate Other (See Comments)     Jittery, diarrhea  . Latex Rash   Past Medical History:  Diagnosis Date  . 1st degree AV block   . ACE-inhibitor cough   . Allergic rhinitis   . Anemia    iron deficiency anemia  . Anxiety   . Aortic ectasia (HCC)    a. CT abd in 12/2016 incidentally noted aortic atherosclerosis and infrarenal abdominal aortic ectasia measuring as large as 2.7 cm with recommendation to repeat US in 2023.  Marland Kitchen Arthritis   . Asthma   . Cataract   . Chronic depression   . Chronic diastolic CHF (congestive heart failure) ([place])   . Chronic headache   . COPD (chronic obstructive pulmonary disease) ([place])   . Coronary artery disease    a. DES to RCA and mid Cx 2009. b. CABG and bioprosthetic AVR May 2015. c. cutting balloon to prox Cx in 05/2016  . Diabetes mellitus    type 2  . Diverticulitis of colon   . Essential hypertension   . GERD (gastroesophageal reflux disease)   . Hearing loss   . History of blood transfusion 2013  . History of prosthetic aortic valve replacement   . HOH (hard of hearing)   . Hypercholesterolemia    intolerance of statins and niaspan  . Mobitz type 1 second degree AV block   . OSA (obstructive sleep apnea)    mild, intolerant of cpap  . PAD (peripheral artery disease) ([place])    a. atherosclerosis by CT abd 12/2016 in [place].  Marland Kitchen PONV (postoperative nausea and vomiting)   . Statin intolerance   . Thyroid disease    Family History  Problem Relation Age of Onset  . Breast cancer Mother 69  . Hypertension Father   . Mesothelioma Father   . Asthma Father   . Stroke Paternal Grandfather   . Heart disease  Unknown   . Breast cancer Maternal Aunt   . Breast cancer Paternal Aunt    Past Surgical History:  Procedure Laterality Date  . ABDOMINAL HYSTERECTOMY    . ABDOMINAL HYSTERECTOMY W/ PARTIAL VAGINACTOMY    . AORTIC VALVE REPLACEMENT N/A 10/12/2013   Procedure: AORTIC VALVE REPLACEMENT (AVR);  Surgeon: [name], MD;  Location: [facility];  Service: Open Heart Surgery;  Laterality: N/A;  . [place]  . BARTHOLIN GLAND CYST EXCISION    . BLADDER SUSPENSION    . BREAST BIOPSY Bilateral 09/11/2000   neg  . BREAST BIOPSY Left 07/24/2010   neg  . BREAST CYST EXCISION  1988   bilateral nonmalignant tumors, x3  . CARDIAC CATHETERIZATION    . CARDIAC CATHETERIZATION N/A 05/25/2016   Procedure: Coronary Balloon Angioplasty;  Surgeon: Leonie Man, MD;  Location: [place] [facility];  Service: Cardiovascular;  Laterality: N/A;  . CARDIAC CATHETERIZATION N/A 05/25/2016   Procedure: Coronary/Graft Angiography;  Surgeon: Leonie Man, MD;  Location: [place] [facility];  Service: Cardiovascular;  Laterality: N/A;  . CATARACT EXTRACTION W/ INTRAOCULAR LENS  IMPLANT, BILATERAL    . CHOLECYSTECTOMY  2001  . COLECTOMY     lap sigmoid  . COLONOSCOPY  2014   polyps found, 2 clamped off.  . CORONARY ANGIOPLASTY  10/29/2007   Prox RCA & Mid Cx.  . CORONARY ARTERY BYPASS GRAFT N/A 10/12/2013   Procedure: CORONARY ARTERY BYPASS GRAFT TIMES TWO;  Surgeon: [name], MD;  Location: [place];  Service: Open Heart Surgery;  Laterality: N/A;  . CORONARY/GRAFT ANGIOGRAPHY N/A 09/20/2017   Procedure: CORONARY/GRAFT ANGIOGRAPHY;  Surgeon: Sherren Mocha, MD;  Location: [place] [facility];  Service: Cardiovascular;  Laterality: N/A;  . LEFT HEART CATHETERIZATION WITH CORONARY ANGIOGRAM N/A 10/09/2013   Procedure: LEFT HEART CATHETERIZATION WITH CORONARY ANGIOGRAM;  Surgeon: [name], MD;  Location: [facility];  Service: Cardiovascular;  Laterality: N/A;  . STERNAL WIRES REMOVAL N/A  04/13/2014   Procedure: STERNAL WIRES REMOVAL;  Surgeon: [name], MD;  Location: [facility];  Service: Thoracic;  Laterality: N/A;  . THYROIDECTOMY    . TONSILLECTOMY    . TUBAL LIGATION    . VAGINAL DELIVERY     3  . VISCERAL ARTERY INTERVENTION N/A 08/16/2016   Procedure: Visceral Artery Intervention;  Surgeon: Algernon Huxley, MD;  Location: [place] [facility];  Service: Cardiovascular;  Laterality: N/A;   Social History   Socioeconomic History  . Marital status: Married    Spouse name: Not on file  . Number of children: 3  . Years of education: Not on file  . Highest education level: Not on file  Occupational History  . Occupation: Retired    Fish farm manager: UNEMPLOYED    Comment: CNA  Social Needs  . Financial resource strain: Not on file  . Food insecurity:    Worry: Not on file    Inability: Not on file  . Transportation needs:    Medical: Not on file    Non-medical: Not on file  Tobacco Use  . Smoking status: Former Smoker    Packs/day: 0.50    Years: 30.00    Pack years: 15.00    Types: Cigarettes    Last attempt to quit: 10/02/2013    Years since quitting: 4.0  . Smokeless tobacco: Never Used  Substance and Sexual Activity  . Alcohol use: No  . Drug use: No  . Sexual activity: Not Currently  Lifestyle  . Physical activity:    Days per week: Not on file    Minutes per  session: Not on file  . Stress: Not on file  Relationships  . Social connections:    Talks on phone: Not on file    Gets together: Not on file    Attends religious service: Not on file    Active member of club or organization: Not on file    Attends meetings of clubs or organizations: Not on file    Relationship status: Not on file  . Intimate partner violence:    Fear of current or ex partner: Not on file    Emotionally abused: Not on file    Physically abused: Not on file    Forced sexual activity: Not on file  Other Topics Concern  . Not on file  Social History Narrative   Does not have  Living Will   Desires CPR, would not want prolonged life support if futile.     Review of Systems: General: negative for chills, fever, night sweats or weight changes.  Cardiovascular: negative for chest pain, dyspnea on exertion, edema, orthopnea, palpitations, paroxysmal nocturnal dyspnea or shortness of breath Dermatological: negative for rash Respiratory: negative for cough or wheezing Urologic: negative for hematuria Abdominal: negative for nausea, vomiting, diarrhea, bright red blood per rectum, melena, or hematemesis Neurologic: negative for visual changes, syncope, or dizziness All other systems reviewed and are otherwise negative except as noted above.   Physical Exam:  Blood pressure 126/72, pulse 69, height 5\' 6"  (1.676 m), weight 190 lb 12.8 oz (86.5 kg), SpO2 95 %.  General appearance: alert, cooperative, no distress and appears anxious  Neck: no carotid bruit and no JVD Lungs: clear to auscultation bilaterally Heart: regular rate and rhythm, S1, S2 normal, no murmur, click, rub or gallop Extremities: extremities normal, atraumatic, no cyanosis or edema Pulses: 2+ and symmetric Skin: Skin color, texture, turgor normal. No rashes or lesions Neurologic: Grossly normal  Cardiac Studies LHC 09/19/17 Procedures   CORONARY/GRAFT ANGIOGRAPHY  Conclusion   1. Severe native 3 vessel CAD with known total occlusion of the RCA, severe proximal LAD stenosis, and continued patency of the LCx with mild in-stent restenosis.  2. 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 Continued patency of the LIMA-LAD and SVG-RCA grafts  Recommend:  Medical management of CAD  Echo to reassess bioprosthetic aortic valve function   2D Echo 09/20/17 Study Conclusions  - Procedure narrative: Transthoracic echocardiography. Image quality was adequate. The study was technically difficult. - Left ventricle:  The cavity size was normal. There was mild concentric hypertrophy. Systolic function was normal. The estimated ejection fraction was in the range of 55% to 60%. Wall motion was normal; there were no regional wall motion abnormalities. Features are consistent with a pseudonormal left ventricular filling pattern, with concomitant abnormal relaxation and increased filling pressure (grade 2 diastolic dysfunction). - Ventricular septum: Septal motion showed paradox. These changes are consistent with a post-thoracotomy state. - Aortic valve: A bioprosthesis was present and functioning normally. Valve area (VTI): 1.98 cm^2. Valve area (Vmax): 1.7 cm^2. Valve area (Vmean): 1.77 cm^2. - Mitral valve: Calcified annulus. Valve area by pressure half-time: 1.56 cm^2. - Left atrium: The atrium was mildly dilated.   EKG not performed -- personally reviewed   ASSESSMENT AND PLAN:   1. CAD: s/p CABG in 2015. Recent LHC showed stable disease. See cath report above. Continue medical therapy for CAD.  ASA and amlodipine. No BB given prior h/o bradycardia. She is  also statin intolerant. Will refer to lipid clinic for PCSK9 inhibitor therapy.   2. Native Aortic Valve Disease: s/p tissue AVR at time of CABG in 2015. Recent 2D echo showed normally functioning AV prosthesis.There was no stenosis and no regurgitation. Mean gradient 17 mm Hg. LVEF was normal, 55-60%. Pt reassured of findings.   3. HLD: LDL not at goal. Recent LDL 110 mg/dL. Goal given CAD is < 70 mg/dL. She is statin intolerant. She has failed Crestor and Lipitor and also intolerant to Zetia. We will refer to lipid clinic for PCSK9 inhibitor therapy.   4. HTN: controlled on current regimen.   5. DM: per PCP.   6. Anxiety: per PCP.   7. COPD: per pulmonology.    Follow-Up w/ Dr. Angelena Form in 6 months   Brittainy Ladoris Gene, MHS [facility] 10/08/2017 9:00 AM

## 2017-10-09 LAB — URINE CULTURE: Culture: NO GROWTH

## 2017-10-13 LAB — CULTURE, BLOOD (ROUTINE X 2)
CULTURE: NO GROWTH
Culture: NO GROWTH
Special Requests: ADEQUATE

## 2017-10-17 ENCOUNTER — Encounter: Payer: Self-pay | Admitting: Internal Medicine

## 2017-10-17 ENCOUNTER — Ambulatory Visit: Payer: Medicare HMO | Admitting: Internal Medicine

## 2017-10-17 VITALS — BP 152/82 | HR 97 | Ht 66.0 in | Wt 190.0 lb

## 2017-10-17 DIAGNOSIS — J449 Chronic obstructive pulmonary disease, unspecified: Secondary | ICD-10-CM

## 2017-10-17 DIAGNOSIS — R918 Other nonspecific abnormal finding of lung field: Secondary | ICD-10-CM

## 2017-10-17 MED ORDER — UMECLIDINIUM BROMIDE 62.5 MCG/INH IN AEPB: 1.0000 | INHALATION_SPRAY | Freq: Every day | RESPIRATORY_TRACT | 12 refills | Status: DC

## 2017-10-17 MED ORDER — UMECLIDINIUM BROMIDE 62.5 MCG/INH IN AEPB: 1.0000 | INHALATION_SPRAY | Freq: Every day | RESPIRATORY_TRACT | Status: DC

## 2017-10-17 NOTE — Progress Notes (Signed)
[place] [facility] Consultation      Date: 10/17/2017,   MRN# [id number] [name] September 23, 1945      AdmissionWeight: 190 lb (86.2 kg)                 CurrentWeight: 190 lb (86.2 kg) [name] is a 72 y.o. old female seen in consultation for SOB and cough.     CHIEF COMPLAINT:   Cough and wheezing Follow up COPD   HISTORY OF PRESENT ILLNESS  71 yo white female with long standing smoking history apporx 30 pack year with COPD. progressive SOB and DOE Intermittent wheezing and COUgh  She is allerghic to [facility] Will start LAMA as it is not in allergy list  She has low garde fevers all the time She has had a lung nodule that was spotted 2 years ago   She is not smoking. She has allergies to multiple meds and symbicort.  She saw her regular doctor with above symptoms, got a course of abx and prednisone which has not helped.    She notes that her wheezing is mostly at night.    Takes allergy shots given by ENT  Quit tobacco in 2015 when she had her CABG worked in Special educational needs teacher for 10 years       Current Medication:  Current Outpatient Medications:  .  albuterol (PROAIR HFA) 108 (90 Base) MCG/ACT inhaler, Inhale 2 puffs into the lungs every 4 (four) hours as needed for wheezing or shortness of breath., Disp: 1 Inhaler, Rfl: 2 .  albuterol (PROVENTIL) (2.5 MG/3ML) 0.083% nebulizer solution, Take 3 mLs (2.5 mg total) by nebulization every 6 (six) hours as needed for wheezing or shortness of breath., Disp: 75 mL, Rfl: 12 .  amLODipine (NORVASC) 2.5 MG tablet, Take 2.5 mg by mouth daily., Disp: , Rfl:  .  aspirin EC 81 MG tablet, Take 1 tablet (81 mg total) by mouth daily., Disp: 90 tablet, Rfl: 3 .  Calcium-Vitamin D (CALTRATE 600 PLUS-VIT D PO), Take 2 tablets by mouth 2 (two) times daily. , Disp: , Rfl:  .  FLUoxetine (PROZAC) 40 MG capsule, Take 40 mg by mouth daily. , Disp: , Rfl:  .  insulin NPH-regular Human (NOVOLIN 70/30) (70-30) 100 UNIT/ML  injection, Inject 70 Units into the skin 2 (two) times daily., Disp: , Rfl:  .  levothyroxine (SYNTHROID, LEVOTHROID) 137 MCG tablet, Take 137 mcg by mouth daily. , Disp: , Rfl:  .  nitroGLYCERIN (NITROSTAT) 0.4 MG SL tablet, Place 1 tablet (0.4 mg total) under the tongue every 5 (five) minutes as needed for chest pain., Disp: 25 tablet, Rfl: 1 .  ondansetron (ZOFRAN) 4 MG tablet, Take 1 tablet (4 mg total) by mouth every 6 (six) hours as needed for nausea., Disp: 20 tablet, Rfl: 0 .  pantoprazole (PROTONIX) 20 MG tablet, Take 20 mg by mouth daily., Disp: , Rfl:     ALLERGIES   Amitriptyline; Benadryl [diphenhydramine]; Demerol [meperidine]; Gabapentin; Loratadine; Meperidine hcl; Mirtazapine; Olanzapine; Voltaren [diclofenac sodium]; Zetia [ezetimibe]; Ativan [lorazepam]; Atorvastatin; Budesonide-formoterol fumarate; Bupropion hcl; Caffeine; Codeine sulfate; Lisinopril; Metformin; Mometasone furoate; Morphine sulfate; Other; Oxycodone-acetaminophen; Pioglitazone; Propoxyphene n-acetaminophen; Rosuvastatin; Shellfish allergy; Suvorexant; Ticagrelor; Tramadol; Trazodone and nefazodone; Venlafaxine; Zolpidem tartrate; and Latex     REVIEW OF SYSTEMS   Review of Systems  Constitutional: Negative for chills, fever, malaise/fatigue and weight loss.  HENT: Negative for congestion and sore throat.   Respiratory: Positive for shortness of breath and wheezing. Negative for cough,  hemoptysis and sputum production.   Cardiovascular: Negative for leg swelling.  Musculoskeletal: Negative.   Skin: Negative for rash.  Neurological: Negative for headaches.  Psychiatric/Behavioral: The patient is nervous/anxious.   All other systems reviewed and are negative.    VS: BP (!) 152/82 (BP Location: Left Arm, Cuff Size: Normal)   Pulse 97   Ht 5\' 6"  (1.676 m)   Wt 190 lb (86.2 kg)   SpO2 99%   BMI 30.67 kg/m      PHYSICAL EXAM  Physical Exam  Constitutional: She is oriented to person, place, and  time. She appears well-developed and well-nourished. No distress.  HENT:  Mouth/Throat: No oropharyngeal exudate.  Eyes: Pupils are equal, round, and reactive to light. EOM are normal.  Cardiovascular: Normal rate, regular rhythm and normal heart sounds.  No murmur heard. Pulmonary/Chest: No stridor. No respiratory distress. She has no wheezes. She has no rales.  Musculoskeletal: Normal range of motion. She exhibits no edema.  Neurological: She is alert and oriented to person, place, and time.  Skin: Skin is warm. She is not diaphoretic.  Psychiatric: She has a normal mood and affect.           IMAGING    Dg Chest 2 View  Result Date: 10/08/2017 CLINICAL DATA:  Low-grade fever for several months. EXAM: CHEST - 2 VIEW COMPARISON:  08/05/2017 FINDINGS: Stable cardiomegaly with aortic atherosclerosis. Status post median sternotomy and aortic valvular replacement. No pulmonary consolidation. No overt pulmonary edema, effusion or pneumothorax. No acute osseous abnormality. IMPRESSION: Stable cardiomegaly with aortic atherosclerosis. No acute pulmonary consolidation. Electronically Signed   By: Ashley Royalty M.D.   On: 10/08/2017 03:44     CXR on 2/14 images reveiwed 5.16.19  ASSESSMENT/PLAN   72 yo white female with long standing smoking history with findings suggestive of Moderate -Severe COPD Gold Stage B with progressive DOE and SOB  1.SOB and DOE multifactorial from COPD, deconditioned state and cardiac disease  2.GOLD STAGE C COPD Patient is allergic to BREO and Symbicort Will start INCRUSE Use albuterol as needed No signs of COPD exacerbation at this time  3.Lung Nodule stable -follow up CT chest needed  4.progressive SOB Patient will need to be tested for exertion and nocturnal hypoxia Check 6MWT and ONO   5.Obesity -recommend significant weight loss -recommend changing diet  6.Deconditioned state -Recommend increased daily activity and  exercise    Patient/Family are satisfied with Plan of action and management. All questions answered Follow up in 1-2 months  [name] Patricia Pesa, M.D.  [facility]  Medical Director [place] Director [facility] Cardio-Pulmonary Department

## 2017-10-17 NOTE — Patient Instructions (Signed)
CT chest to assess lung nodule START INICRUSE  Check 6MWT and ONO

## 2017-10-18 ENCOUNTER — Telehealth: Payer: Self-pay | Admitting: Internal Medicine

## 2017-10-18 NOTE — Telephone Encounter (Signed)
Checked status this morning, afternoon and at 4:00 pm. CT Chest is still pending with [employer].  Called and spoke with pt's husband and advised him that [facility] has not approved CT Chest as of yet. That case was in physician review and has not been approved as of yet.  Advised husband that I would follow back up on referral on Monday. Husband voiced understanding. Rhonda J Cobb

## 2017-10-18 NOTE — Telephone Encounter (Signed)
Patient spouse calling to see if imaging has been scheduled .  Please call

## 2017-10-21 NOTE — Telephone Encounter (Signed)
Checked status of CT Chest PA and still pending.  Called and advised pt's husband. Also faxed CT Chest Report to [place]. Rhonda J Cobb

## 2017-10-22 ENCOUNTER — Ambulatory Visit (INDEPENDENT_AMBULATORY_CARE_PROVIDER_SITE_OTHER): Payer: Medicare HMO | Admitting: *Deleted

## 2017-10-22 DIAGNOSIS — J449 Chronic obstructive pulmonary disease, unspecified: Secondary | ICD-10-CM

## 2017-10-22 NOTE — Progress Notes (Signed)
SIX MIN WALK 10/22/2017 07/21/2015 12/12/2012  Medications Amlodipine 2.5mg /ASA 81 mg/Fluoxetine 40 mg/Novolin/Levothyroxine 137 mcg/Pantoprazole 20 mg Aspirin, Calcium-Vit D, Celexa, Humalog insulin, Prevacid, Synthroid,  -  Supplimental Oxygen during Test? (L/min) No No No  Laps 6 5 -  Partial Lap (in Meters) 18 36 -  Baseline BP (sitting) 150/88 126/78 -  Baseline Heartrate 76 63 -  Baseline Dyspnea (Borg Scale) 0.5 0 -  Baseline Fatigue (Borg Scale) 2 1 -  Baseline SPO2 97 98 -  BP (sitting) 180/78 150/92 -  Heartrate 123 80 -  Dyspnea (Borg Scale) 5 3 -  Fatigue (Borg Scale) 7 3 -  SPO2 92 97 -  BP (sitting) 170/78 132/82 -  Heartrate 93 66 -  SPO2 98 97 -  Stopped or Paused before Six Minutes No No -  Interpretation Angina;Dizziness - -  Distance Completed 306 276 -  Tech Comments: pt walked brisk walk and c/o angina and sob. She was encouraged to complete walk as she did. She did not desat. Pt c/o of hip pain stating she didn't know if this was normal b/c she doesn't walk this much at one time. Lowest o2 sat during walk 93% RA.     SMW completed today.

## 2017-10-23 NOTE — Telephone Encounter (Signed)
[facility] approved CT Chest. Appointment scheduled for Tues 10/29/17 at 8:00 at [facility]. Pt to arrive at 7:45. Husband called to inform of appointment date, time, location and instructions. Nothing else needed at this time. Rhonda J Cobb

## 2017-10-29 ENCOUNTER — Ambulatory Visit
Admission: RE | Admit: 2017-10-29 | Discharge: 2017-10-29 | Disposition: A | Discharge status: Home / Self Care | Payer: Medicare HMO | Source: Ambulatory Visit | Attending: Internal Medicine | Admitting: Internal Medicine

## 2017-10-29 DIAGNOSIS — I2510 Atherosclerotic heart disease of native coronary artery without angina pectoris: Secondary | ICD-10-CM | POA: Diagnosis not present

## 2017-10-29 DIAGNOSIS — I700 Atherosclerosis of aorta: Secondary | ICD-10-CM | POA: Diagnosis not present

## 2017-10-29 DIAGNOSIS — R911 Solitary pulmonary nodule: Secondary | ICD-10-CM | POA: Insufficient documentation

## 2017-10-29 DIAGNOSIS — R918 Other nonspecific abnormal finding of lung field: Secondary | ICD-10-CM

## 2017-10-31 ENCOUNTER — Telehealth: Payer: Self-pay | Admitting: Internal Medicine

## 2017-10-31 NOTE — Telephone Encounter (Signed)
Please advise on results of CT scan from 5/28.

## 2017-10-31 NOTE — Telephone Encounter (Signed)
Returned call to patient; no answer; left message

## 2017-11-01 ENCOUNTER — Ambulatory Visit: Payer: Medicare HMO

## 2017-11-14 NOTE — Telephone Encounter (Signed)
No significant changes on CT scan

## 2017-11-14 NOTE — Telephone Encounter (Signed)
Patient calling to check on results.  Please call.

## 2017-11-14 NOTE — Telephone Encounter (Signed)
Pt advised. Nothing further needed.   

## 2017-12-24 ENCOUNTER — Ambulatory Visit: Payer: Medicare HMO | Admitting: Internal Medicine

## 2017-12-28 ENCOUNTER — Emergency Department: Payer: Medicare HMO

## 2017-12-28 ENCOUNTER — Other Ambulatory Visit: Payer: Self-pay

## 2017-12-28 ENCOUNTER — Encounter: Payer: Self-pay | Admitting: Emergency Medicine

## 2017-12-28 ENCOUNTER — Inpatient Hospital Stay
Admission: EM | Admit: 2017-12-28 | Discharge: 2017-12-29 | DRG: 191 | Disposition: A | Discharge status: Home / Self Care | Payer: Medicare HMO | Attending: Specialist | Admitting: Specialist

## 2017-12-28 DIAGNOSIS — H9190 Unspecified hearing loss, unspecified ear: Secondary | ICD-10-CM | POA: Diagnosis present

## 2017-12-28 DIAGNOSIS — F419 Anxiety disorder, unspecified: Secondary | ICD-10-CM | POA: Diagnosis present

## 2017-12-28 DIAGNOSIS — R0602 Shortness of breath: Secondary | ICD-10-CM | POA: Diagnosis present

## 2017-12-28 DIAGNOSIS — Z825 Family history of asthma and other chronic lower respiratory diseases: Secondary | ICD-10-CM

## 2017-12-28 DIAGNOSIS — E1151 Type 2 diabetes mellitus with diabetic peripheral angiopathy without gangrene: Secondary | ICD-10-CM | POA: Diagnosis present

## 2017-12-28 DIAGNOSIS — K219 Gastro-esophageal reflux disease without esophagitis: Secondary | ICD-10-CM | POA: Diagnosis present

## 2017-12-28 DIAGNOSIS — D509 Iron deficiency anemia, unspecified: Secondary | ICD-10-CM | POA: Diagnosis present

## 2017-12-28 DIAGNOSIS — I2510 Atherosclerotic heart disease of native coronary artery without angina pectoris: Secondary | ICD-10-CM | POA: Diagnosis present

## 2017-12-28 DIAGNOSIS — Z91048 Other nonmedicinal substance allergy status: Secondary | ICD-10-CM

## 2017-12-28 DIAGNOSIS — Z87891 Personal history of nicotine dependence: Secondary | ICD-10-CM | POA: Diagnosis not present

## 2017-12-28 DIAGNOSIS — E785 Hyperlipidemia, unspecified: Secondary | ICD-10-CM | POA: Diagnosis present

## 2017-12-28 DIAGNOSIS — R918 Other nonspecific abnormal finding of lung field: Secondary | ICD-10-CM

## 2017-12-28 DIAGNOSIS — Z794 Long term (current) use of insulin: Secondary | ICD-10-CM

## 2017-12-28 DIAGNOSIS — Z961 Presence of intraocular lens: Secondary | ICD-10-CM | POA: Diagnosis present

## 2017-12-28 DIAGNOSIS — F3342 Major depressive disorder, recurrent, in full remission: Secondary | ICD-10-CM | POA: Diagnosis present

## 2017-12-28 DIAGNOSIS — Z951 Presence of aortocoronary bypass graft: Secondary | ICD-10-CM | POA: Diagnosis not present

## 2017-12-28 DIAGNOSIS — Z86010 Personal history of colonic polyps: Secondary | ICD-10-CM | POA: Diagnosis not present

## 2017-12-28 DIAGNOSIS — I252 Old myocardial infarction: Secondary | ICD-10-CM | POA: Diagnosis not present

## 2017-12-28 DIAGNOSIS — I272 Pulmonary hypertension, unspecified: Secondary | ICD-10-CM | POA: Diagnosis present

## 2017-12-28 DIAGNOSIS — Z952 Presence of prosthetic heart valve: Secondary | ICD-10-CM

## 2017-12-28 DIAGNOSIS — Z91040 Latex allergy status: Secondary | ICD-10-CM

## 2017-12-28 DIAGNOSIS — Z91013 Allergy to seafood: Secondary | ICD-10-CM

## 2017-12-28 DIAGNOSIS — I5032 Chronic diastolic (congestive) heart failure: Secondary | ICD-10-CM | POA: Diagnosis present

## 2017-12-28 DIAGNOSIS — G4733 Obstructive sleep apnea (adult) (pediatric): Secondary | ICD-10-CM | POA: Diagnosis present

## 2017-12-28 DIAGNOSIS — Z803 Family history of malignant neoplasm of breast: Secondary | ICD-10-CM

## 2017-12-28 DIAGNOSIS — I700 Atherosclerosis of aorta: Secondary | ICD-10-CM | POA: Diagnosis present

## 2017-12-28 DIAGNOSIS — J449 Chronic obstructive pulmonary disease, unspecified: Secondary | ICD-10-CM

## 2017-12-28 DIAGNOSIS — Z808 Family history of malignant neoplasm of other organs or systems: Secondary | ICD-10-CM

## 2017-12-28 DIAGNOSIS — E039 Hypothyroidism, unspecified: Secondary | ICD-10-CM | POA: Diagnosis present

## 2017-12-28 DIAGNOSIS — I110 Hypertensive heart disease with heart failure: Secondary | ICD-10-CM | POA: Diagnosis present

## 2017-12-28 DIAGNOSIS — Z8249 Family history of ischemic heart disease and other diseases of the circulatory system: Secondary | ICD-10-CM

## 2017-12-28 DIAGNOSIS — Z79899 Other long term (current) drug therapy: Secondary | ICD-10-CM

## 2017-12-28 DIAGNOSIS — J441 Chronic obstructive pulmonary disease with (acute) exacerbation: Secondary | ICD-10-CM | POA: Diagnosis present

## 2017-12-28 DIAGNOSIS — Z7982 Long term (current) use of aspirin: Secondary | ICD-10-CM

## 2017-12-28 DIAGNOSIS — E1165 Type 2 diabetes mellitus with hyperglycemia: Secondary | ICD-10-CM | POA: Diagnosis present

## 2017-12-28 DIAGNOSIS — Z7951 Long term (current) use of inhaled steroids: Secondary | ICD-10-CM

## 2017-12-28 DIAGNOSIS — Z79890 Hormone replacement therapy (postmenopausal): Secondary | ICD-10-CM

## 2017-12-28 DIAGNOSIS — Z90710 Acquired absence of both cervix and uterus: Secondary | ICD-10-CM

## 2017-12-28 DIAGNOSIS — Z6831 Body mass index (BMI) 31.0-31.9, adult: Secondary | ICD-10-CM | POA: Diagnosis not present

## 2017-12-28 DIAGNOSIS — Z9089 Acquired absence of other organs: Secondary | ICD-10-CM

## 2017-12-28 DIAGNOSIS — Z9841 Cataract extraction status, right eye: Secondary | ICD-10-CM

## 2017-12-28 DIAGNOSIS — Z823 Family history of stroke: Secondary | ICD-10-CM

## 2017-12-28 DIAGNOSIS — Z885 Allergy status to narcotic agent status: Secondary | ICD-10-CM

## 2017-12-28 DIAGNOSIS — Z888 Allergy status to other drugs, medicaments and biological substances status: Secondary | ICD-10-CM

## 2017-12-28 DIAGNOSIS — Z9842 Cataract extraction status, left eye: Secondary | ICD-10-CM

## 2017-12-28 LAB — BASIC METABOLIC PANEL
Anion gap: 10 (ref 5–15)
BUN: 15 mg/dL (ref 8–23)
CHLORIDE: 102 mmol/L (ref 98–111)
CO2: 25 mmol/L (ref 22–32)
Calcium: 8.5 mg/dL — ABNORMAL LOW (ref 8.9–10.3)
Creatinine, Ser: 0.82 mg/dL (ref 0.44–1.00)
GFR calc Af Amer: >60 mL/min (ref >60)
GFR calc non Af Amer: >60 mL/min (ref >60)
GLUCOSE: 222 mg/dL — AB (ref 70–99)
POTASSIUM: 3.9 mmol/L (ref 3.5–5.1)
Sodium: 137 mmol/L (ref 135–145)

## 2017-12-28 LAB — CBC
HEMATOCRIT: 30.6 % — AB (ref 35.0–47.0)
Hemoglobin: 9.9 g/dL — ABNORMAL LOW (ref 12.0–16.0)
MCH: 23 pg — AB (ref 26.0–34.0)
MCHC: 32.4 g/dL (ref 32.0–36.0)
MCV: 71.2 fL — ABNORMAL LOW (ref 80.0–100.0)
Platelets: 231 10*3/uL (ref 150–440)
RBC: 4.3 MIL/uL (ref 3.80–5.20)
RDW: 17.4 % — AB (ref 11.5–14.5)
WBC: 9.2 10*3/uL (ref 3.6–11.0)

## 2017-12-28 LAB — GLUCOSE, CAPILLARY
GLUCOSE-CAPILLARY: 517 mg/dL — AB (ref 70–99)
GLUCOSE-CAPILLARY: 475 mg/dL — AB (ref 70–99)
Glucose-Capillary: 344 mg/dL — ABNORMAL HIGH (ref 70–99)

## 2017-12-28 LAB — BRAIN NATRIURETIC PEPTIDE: B Natriuretic Peptide: 259 pg/mL — ABNORMAL HIGH (ref 0.0–100.0)

## 2017-12-28 LAB — TROPONIN I: Troponin I: <0.03 ng/mL (ref <0.03)

## 2017-12-28 LAB — FIBRIN DERIVATIVES D-DIMER (ARMC ONLY): FIBRIN DERIVATIVES D-DIMER (ARMC): 1384.89 ng{FEU}/mL — AB (ref 0.00–499.00)

## 2017-12-28 MED ORDER — ALBUTEROL SULFATE (2.5 MG/3ML) 0.083% IN NEBU
2.5000 mg | INHALATION_SOLUTION | Freq: Once | RESPIRATORY_TRACT | Status: AC
  Administered 2017-12-28: 2.5 mg via RESPIRATORY_TRACT
  Filled 2017-12-28: qty 3

## 2017-12-28 MED ORDER — FUROSEMIDE 10 MG/ML IJ SOLN
20.0000 mg | Freq: Once | INTRAMUSCULAR | Status: AC
  Administered 2017-12-28: 20 mg via INTRAVENOUS
  Filled 2017-12-28: qty 4

## 2017-12-28 MED ORDER — ENOXAPARIN SODIUM 40 MG/0.4ML ~~LOC~~ SOLN
40.0000 mg | SUBCUTANEOUS | Status: DC
  Administered 2017-12-28: 40 mg via SUBCUTANEOUS
  Filled 2017-12-28: qty 0.4

## 2017-12-28 MED ORDER — UMECLIDINIUM BROMIDE 62.5 MCG/INH IN AEPB
1.0000 | INHALATION_SPRAY | Freq: Every day | RESPIRATORY_TRACT | Status: DC
  Administered 2017-12-29: 1 via RESPIRATORY_TRACT
  Filled 2017-12-28 (×2): qty 7

## 2017-12-28 MED ORDER — ASPIRIN 81 MG PO CHEW
324.0000 mg | CHEWABLE_TABLET | Freq: Once | ORAL | Status: AC
  Administered 2017-12-28: 324 mg via ORAL
  Filled 2017-12-28: qty 4

## 2017-12-28 MED ORDER — AMLODIPINE BESYLATE 5 MG PO TABS
2.5000 mg | ORAL_TABLET | Freq: Every day | ORAL | Status: DC
  Administered 2017-12-28 – 2017-12-29 (×2): 2.5 mg via ORAL
  Filled 2017-12-28 (×2): qty 1

## 2017-12-28 MED ORDER — SODIUM CHLORIDE 0.9% FLUSH: 3.0000 mL | INTRAVENOUS | Status: DC | PRN

## 2017-12-28 MED ORDER — INSULIN ASPART PROT & ASPART (70-30 MIX) 100 UNIT/ML ~~LOC~~ SUSP
70.0000 [IU] | Freq: Two times a day (BID) | SUBCUTANEOUS | Status: DC
  Administered 2017-12-28 – 2017-12-29 (×2): 70 [IU] via SUBCUTANEOUS
  Filled 2017-12-28 (×2): qty 10

## 2017-12-28 MED ORDER — IOPAMIDOL (ISOVUE-370) INJECTION 76%
75.0000 mL | Freq: Once | INTRAVENOUS | Status: AC | PRN
  Administered 2017-12-28: 75 mL via INTRAVENOUS

## 2017-12-28 MED ORDER — INSULIN REGULAR HUMAN 100 UNIT/ML IJ SOLN
20.0000 [IU] | Freq: Once | INTRAMUSCULAR | Status: AC
  Administered 2017-12-29: 20 [IU] via INTRAVENOUS
  Filled 2017-12-28: qty 0.2

## 2017-12-28 MED ORDER — BISACODYL 5 MG PO TBEC: 5.0000 mg | DELAYED_RELEASE_TABLET | Freq: Every day | ORAL | Status: DC | PRN

## 2017-12-28 MED ORDER — NITROGLYCERIN 0.4 MG SL SUBL: 0.4000 mg | SUBLINGUAL_TABLET | SUBLINGUAL | Status: DC | PRN

## 2017-12-28 MED ORDER — INSULIN ASPART 100 UNIT/ML ~~LOC~~ SOLN
0.0000 [IU] | Freq: Three times a day (TID) | SUBCUTANEOUS | Status: DC
Start: 2017-12-29 — End: 2017-12-29
  Administered 2017-12-29: 15 [IU] via SUBCUTANEOUS
  Administered 2017-12-29: 20 [IU] via SUBCUTANEOUS
  Filled 2017-12-28 (×2): qty 1

## 2017-12-28 MED ORDER — ALBUTEROL SULFATE (2.5 MG/3ML) 0.083% IN NEBU
2.5000 mg | INHALATION_SOLUTION | Freq: Four times a day (QID) | RESPIRATORY_TRACT | Status: DC
  Administered 2017-12-28 – 2017-12-29 (×3): 2.5 mg via RESPIRATORY_TRACT
  Filled 2017-12-28 (×5): qty 3

## 2017-12-28 MED ORDER — ONDANSETRON HCL 4 MG/2ML IJ SOLN: 4.0000 mg | Freq: Four times a day (QID) | INTRAMUSCULAR | Status: DC | PRN

## 2017-12-28 MED ORDER — ONDANSETRON HCL 4 MG PO TABS
4.0000 mg | ORAL_TABLET | Freq: Four times a day (QID) | ORAL | Status: DC | PRN
Start: 2017-12-28 — End: 2017-12-29

## 2017-12-28 MED ORDER — INSULIN ASPART 100 UNIT/ML ~~LOC~~ SOLN
0.0000 [IU] | Freq: Three times a day (TID) | SUBCUTANEOUS | Status: DC
  Administered 2017-12-28: 7 [IU] via SUBCUTANEOUS
  Filled 2017-12-28: qty 1

## 2017-12-28 MED ORDER — METHYLPREDNISOLONE SODIUM SUCC 125 MG IJ SOLR
60.0000 mg | Freq: Two times a day (BID) | INTRAMUSCULAR | Status: DC
  Administered 2017-12-28 – 2017-12-29 (×2): 60 mg via INTRAVENOUS
  Filled 2017-12-28 (×2): qty 2

## 2017-12-28 MED ORDER — INSULIN REGULAR HUMAN 100 UNIT/ML IJ SOLN
10.0000 [IU] | Freq: Once | INTRAMUSCULAR | Status: AC
  Administered 2017-12-28: 10 [IU] via INTRAVENOUS
  Filled 2017-12-28: qty 0.1

## 2017-12-28 MED ORDER — PANTOPRAZOLE SODIUM 40 MG PO TBEC
40.0000 mg | DELAYED_RELEASE_TABLET | Freq: Every day | ORAL | Status: DC
  Administered 2017-12-29: 40 mg via ORAL
  Filled 2017-12-28 (×2): qty 1

## 2017-12-28 MED ORDER — SENNOSIDES-DOCUSATE SODIUM 8.6-50 MG PO TABS: 1.0000 | ORAL_TABLET | Freq: Every evening | ORAL | Status: DC | PRN

## 2017-12-28 MED ORDER — SODIUM CHLORIDE 0.9 % IV SOLN: 250.0000 mL | INTRAVENOUS | Status: DC | PRN

## 2017-12-28 MED ORDER — INSULIN ASPART 100 UNIT/ML ~~LOC~~ SOLN: 0.0000 [IU] | Freq: Every day | SUBCUTANEOUS | Status: DC

## 2017-12-28 MED ORDER — ASPIRIN EC 81 MG PO TBEC
81.0000 mg | DELAYED_RELEASE_TABLET | Freq: Every day | ORAL | Status: DC
  Administered 2017-12-29: 81 mg via ORAL
  Filled 2017-12-28: qty 1

## 2017-12-28 MED ORDER — SODIUM CHLORIDE 0.9% FLUSH
3.0000 mL | Freq: Two times a day (BID) | INTRAVENOUS | Status: DC
  Administered 2017-12-28 – 2017-12-29 (×3): 3 mL via INTRAVENOUS

## 2017-12-28 MED ORDER — FLUOXETINE HCL 20 MG PO CAPS
40.0000 mg | ORAL_CAPSULE | Freq: Every day | ORAL | Status: DC
  Administered 2017-12-29: 40 mg via ORAL
  Filled 2017-12-28: qty 2

## 2017-12-28 MED ORDER — ALBUTEROL SULFATE (2.5 MG/3ML) 0.083% IN NEBU: 2.5000 mg | INHALATION_SOLUTION | RESPIRATORY_TRACT | Status: DC | PRN

## 2017-12-28 MED ORDER — LEVOTHYROXINE SODIUM 137 MCG PO TABS
137.0000 ug | ORAL_TABLET | Freq: Every day | ORAL | Status: DC
  Administered 2017-12-29: 137 ug via ORAL
  Filled 2017-12-28: qty 1

## 2017-12-28 MED ORDER — METHYLPREDNISOLONE SODIUM SUCC 125 MG IJ SOLR
80.0000 mg | INTRAMUSCULAR | Status: AC
  Administered 2017-12-28: 80 mg via INTRAVENOUS
  Filled 2017-12-28: qty 2

## 2017-12-28 NOTE — Progress Notes (Signed)
   12/28/17 1715  Clinical Encounter Type  Visited With Patient  Visit Type Initial   Provided education on Advanced Directives.  Patient verbalized her understanding and had no questions.  States she wants to discuss with her husband.  Copy of AD document left with patient. Encouraged her to request chaplain if she thinks of questions or would like to proceed.

## 2017-12-28 NOTE — ED Notes (Signed)
FIRST NURSE NOTE:  Pt c/o sob with no improvement after using inhaler and nebs. Has been wheezing all week.

## 2017-12-28 NOTE — ED Provider Notes (Addendum)
[facility] Emergency Department Provider Note   ____________________________________________   First MD Initiated Contact with Patient 12/28/17 1014     (approximate)  I have reviewed the triage vital signs and the nursing notes.   HISTORY  Chief Complaint Shortness of Breath    HPI [name] is a 72 y.o. female here for evaluation for shortness of breath for 3 days as well as left-sided chest pain.  Patient reports she is been wheezing at home, coughing somewhat nonproductive cough.  Using her albuterol inhaler the last couple of days up to a couple of times a day without help.  Also having a discomfort of the left chest that she reports is slight, hard to describe it feels similar to when she had a heart attack and needed a bypass a few years ago.  No nausea vomiting.  No fatigue.  Does report she had a fever about 3 or 4 nights ago which is since gone away.  Also reports she is been being worked up for intermittent fevers by her doctors without a clear or known cause.     Past Medical History:  Diagnosis Date  . 1st degree AV block   . ACE-inhibitor cough   . Allergic rhinitis   . Anemia    iron deficiency anemia  . Anxiety   . Aortic ectasia (HCC)    a. CT abd in 12/2016 incidentally noted aortic atherosclerosis and infrarenal abdominal aortic ectasia measuring as large as 2.7 cm with recommendation to repeat US in 2023.  Marland Kitchen Arthritis   . Asthma   . Cataract   . Chronic depression   . Chronic diastolic CHF (congestive heart failure) ([place])   . Chronic headache   . COPD (chronic obstructive pulmonary disease) ([place])   . Coronary artery disease    a. DES to RCA and mid Cx 2009. b. CABG and bioprosthetic AVR May 2015. c. cutting balloon to prox Cx in 05/2016  . Diabetes mellitus    type 2  . Diverticulitis of colon   . Essential hypertension   . GERD (gastroesophageal reflux disease)   . Hearing loss   . History of blood transfusion 2013    . History of prosthetic aortic valve replacement   . HOH (hard of hearing)   . Hypercholesterolemia    intolerance of statins and niaspan  . Mobitz type 1 second degree AV block   . OSA (obstructive sleep apnea)    mild, intolerant of cpap  . PAD (peripheral artery disease) ([place])    a. atherosclerosis by CT abd 12/2016 in [place].  Marland Kitchen PONV (postoperative nausea and vomiting)   . Statin intolerance   . Thyroid disease     Patient Active Problem List   Diagnosis Date Noted  . Depression, major, single episode, complete remission ([place]) 04/11/2017  . 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 Aortic ectasia, abdominal ([place]) 01/01/2017  . GAD (generalized anxiety disorder) 09/26/2016  . Acute posthemorrhagic anemia 08/24/2016  . Acute respiratory failure with hypoxia ([place]) 08/24/2016  . Acute diastolic CHF (congestive heart failure) ([place]) 08/24/2016  . Hyponatremia 08/24/2016  . Leukocytosis 08/24/2016  . Fever 08/24/2016  . Diarrhea 08/24/2016  . Generalized weakness 08/24/2016  . Acute diverticulitis 08/16/2016  . Lower GI bleed   . Ataxia 08/09/2016  . Dizziness 08/09/2016  . Staggering gait 08/09/2016  . NSTEMI (non-ST elevated myocardial infarction) ([place])   . AVB (atrioventricular block)   . Atypical chest pain   . Unstable  angina ([place]) 05/25/2016  . Palpitations 05/25/2016  . PUD - recent Rx for H.Pylori 05/25/2016  . Stenosis of coronary artery stent   . Recurrent major depressive disorder, in partial remission ([place]) 07/29/2015  . Parotiditis 04/25/2015  . Viral URI 03/24/2015  . Hypoglycemia 08/25/2014  . Nausea without vomiting 08/25/2014  . Dyslipidemia-statin ontol   . Long-term insulin use ([place]) 03/31/2014  . Aortic valve disorder 11/11/2013  . 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 Coronary Artery Disease s/p CABG 10/2013 10/10/2013  . Other malaise and fatigue 09/11/2013  . Alopecia 03/06/2013  . Insomnia 02/06/2013  . Gastroesophageal reflux disease without  esophagitis 01/19/2013  . Dysphagia, unspecified(787.20) 01/19/2013  . Dyspnea 11/17/2012  . Weakness 11/17/2012  . Abnormal MRI of head 09/05/2012  . Depression with anxiety 03/21/2012  . Extrinsic asthma 11/12/2011  . Anemia 06/07/2011  . Chronic obstructive pulmonary disease ([place]) 01/25/2010  . Acquired hypothyroidism 08/10/2009  . MICROSCOPIC HEMATURIA 05/07/2009  . GOITER 03/02/2009  . Essential hypertension 01/06/2009  . MEMORY LOSS 06/23/2008  . Obstructive sleep apnea 12/25/2007  . Insulin dependent diabetes mellitus ([place]) 09/09/2006  . ALLERGIC RHINITIS 09/09/2006  . DIVERTICULOSIS, COLON 09/09/2006    Past Surgical History:  Procedure Laterality Date  . ABDOMINAL HYSTERECTOMY    . ABDOMINAL HYSTERECTOMY W/ PARTIAL VAGINACTOMY    . AORTIC VALVE REPLACEMENT N/A 10/12/2013   Procedure: AORTIC VALVE REPLACEMENT (AVR);  Surgeon: [name], MD;  Location: [place];  Service: Open Heart Surgery;  Laterality: N/A;  . [place]  . BARTHOLIN GLAND CYST EXCISION    . BLADDER SUSPENSION    . BREAST BIOPSY Bilateral 09/11/2000   neg  . BREAST BIOPSY Left 07/24/2010   neg  . BREAST CYST EXCISION  1988   bilateral nonmalignant tumors, x3  . CARDIAC CATHETERIZATION    . CARDIAC CATHETERIZATION N/A 05/25/2016   Procedure: Coronary Balloon Angioplasty;  Surgeon: Leonie Man, MD;  Location: [facility];  Service: Cardiovascular;  Laterality: N/A;  . CARDIAC CATHETERIZATION N/A 05/25/2016   Procedure: Coronary/Graft Angiography;  Surgeon: Leonie Man, MD;  Location: [place] [facility];  Service: Cardiovascular;  Laterality: N/A;  . CATARACT EXTRACTION W/ INTRAOCULAR LENS  IMPLANT, BILATERAL    . CHOLECYSTECTOMY  2001  . COLECTOMY     lap sigmoid  . COLONOSCOPY  2014   polyps found, 2 clamped off.  . CORONARY ANGIOPLASTY  10/29/2007   Prox RCA & Mid Cx.  . CORONARY ARTERY BYPASS GRAFT N/A 10/12/2013   Procedure: CORONARY ARTERY BYPASS GRAFT TIMES TWO;   Surgeon: [name], MD;  Location: [facility];  Service: Open Heart Surgery;  Laterality: N/A;  . CORONARY/GRAFT ANGIOGRAPHY N/A 09/20/2017   Procedure: CORONARY/GRAFT ANGIOGRAPHY;  Surgeon: Sherren Mocha, MD;  Location: [place] [facility];  Service: Cardiovascular;  Laterality: N/A;  . LEFT HEART CATHETERIZATION WITH CORONARY ANGIOGRAM N/A 10/09/2013   Procedure: LEFT HEART CATHETERIZATION WITH CORONARY ANGIOGRAM;  Surgeon: [name], MD;  Location: [facility] C[facility];  Service: Cardiovascular;  Laterality: N/A;  . STERNAL WIRES REMOVAL N/A 04/13/2014   Procedure: STERNAL WIRES REMOVAL;  Surgeon: [name], MD;  Location: [facility] OR;  Service: Thoracic;  Laterality: N/A;  . THYROIDECTOMY    . TONSILLECTOMY    . TUBAL LIGATION    . VAGINAL DELIVERY     3  . VISCERAL ARTERY INTERVENTION N/A 08/16/2016   Procedure: Visceral Artery Intervention;  Surgeon: Algernon Huxley, MD;  Location: [place] [facility];  Service: Cardiovascular;  Laterality: N/A;    Prior to Admission medications   Medication Sig Start Date End Date Taking? Authorizing Provider  albuterol (PROAIR HFA) 108 (90 Base) MCG/ACT inhaler Inhale 2 puffs into the lungs every 4 (four) hours as needed for wheezing or shortness of breath. 07/17/16   Flora Lipps, MD  albuterol (PROVENTIL) (2.5 MG/3ML) 0.083% nebulizer solution Take 3 mLs (2.5 mg total) by nebulization every 6 (six) hours as needed for wheezing or shortness of breath. 09/23/17   Laverle Hobby, MD  amLODipine (NORVASC) 2.5 MG tablet Take 2.5 mg by mouth daily.    [provider]  aspirin EC 81 MG tablet Take 1 tablet (81 mg total) by mouth daily. 01/28/17   Dunn, Nedra Hai, PA-C  Calcium-Vitamin D (CALTRATE 600 PLUS-VIT D PO) Take 2 tablets by mouth 2 (two) times daily.     [provider]  FLUoxetine (PROZAC) 40 MG capsule Take 40 mg by mouth daily.  07/29/15   [provider]  insulin NPH-regular Human (NOVOLIN 70/30) (70-30) 100 UNIT/ML  injection Inject 70 Units into the skin 2 (two) times daily.    [provider]  levothyroxine (SYNTHROID, LEVOTHROID) 137 MCG tablet Take 137 mcg by mouth daily.  04/03/17   [provider]  nitroGLYCERIN (NITROSTAT) 0.4 MG SL tablet Place 1 tablet (0.4 mg total) under the tongue every 5 (five) minutes as needed for chest pain. 07/25/17   Lyda Jester M, PA-C  ondansetron (ZOFRAN) 4 MG tablet Take 1 tablet (4 mg total) by mouth every 6 (six) hours as needed for nausea. 08/24/16   Theodoro Grist, MD  pantoprazole (PROTONIX) 20 MG tablet Take 20 mg by mouth daily. 01/23/17   [provider]  umeclidinium bromide (INCRUSE ELLIPTA) 62.5 MCG/INH AEPB Inhale 1 puff into the lungs daily. 10/17/17   Flora Lipps, MD    Allergies Amitriptyline; Benadryl [diphenhydramine]; Demerol [meperidine]; Gabapentin; Loratadine; Meperidine hcl; Mirtazapine; Olanzapine; Voltaren [diclofenac sodium]; Zetia [ezetimibe]; Ativan [lorazepam]; Atorvastatin; Budesonide-formoterol fumarate; Bupropion hcl; Caffeine; Codeine sulfate; Lisinopril; Metformin; Mometasone furoate; Morphine sulfate; Other; Oxycodone-acetaminophen; Pioglitazone; Propoxyphene n-acetaminophen; Rosuvastatin; Shellfish allergy; Suvorexant; Ticagrelor; Tramadol; Trazodone and nefazodone; Venlafaxine; Zolpidem tartrate; and Latex  Family History  Problem Relation Age of Onset  . Breast cancer Mother 60  . Hypertension Father   . Mesothelioma Father   . Asthma Father   . Stroke Paternal Grandfather   . Heart disease Unknown   . Breast cancer Maternal Aunt   . Breast cancer Paternal Aunt     Social History Social History   Tobacco Use  . Smoking status: Former Smoker    Packs/day: 0.50    Years: 30.00    Pack years: 15.00    Types: Cigarettes    Last attempt to quit: 10/02/2013    Years since quitting: 4.2  . Smokeless tobacco: Never Used  Substance Use Topics  . Alcohol use: No  . Drug use: No    Review of  Systems Constitutional: See HPI.  No chills.  Eyes: No visual changes. ENT: No sore throat. Cardiovascular: See HPI Respiratory: See HPI gastrointestinal: No abdominal pain.  No nausea, no vomiting.  No diarrhea.  No constipation. Genitourinary: Negative for dysuria. Musculoskeletal: Negative for back pain. Skin: Negative for rash. Neurological: Negative for headaches, focal weakness or numbness.    ____________________________________________   PHYSICAL EXAM:  VITAL SIGNS: ED Triage Vitals  Enc Vitals Group     BP 12/28/17 0957 (!) 152/58  Pulse Rate 12/28/17 0957 78     Resp 12/28/17 0957 (!) 26     Temp 12/28/17 0957 98 F (36.7 C)     Temp Source 12/28/17 0957 Oral     SpO2 12/28/17 0957 100 %     Weight 12/28/17 1010 195 lb (88.5 kg)     Height 12/28/17 1010 5\' 6"  (1.676 m)     Head Circumference --      Peak Flow --      Pain Score 12/28/17 1010 0     Pain Loc --      Pain Edu? --      Excl. in [place]? --     Constitutional: Alert and oriented.  Sitting upright, appears slightly different dyspneic with mild tachypnea and slight use of respiratory accessory muscles. Eyes: Conjunctivae are normal. Head: Atraumatic. Nose: No congestion/rhinnorhea. Mouth/Throat: Mucous membranes are moist. Neck: No stridor.   Cardiovascular: Normal rate, regular rhythm. Grossly normal heart sounds.  Good peripheral circulation. Respiratory: Mild tachypnea, there is mild end expiratory wheezing noted throughout all lobes.  No rales or crackles.  Speaks in phrases.  Use of mild accessory muscles.  Normal oxygen saturation at 99% on room air. Gastrointestinal: Soft and nontender. No distention. Musculoskeletal: No lower extremity tenderness nor edema. Neurologic:  Normal speech and language. No gross focal neurologic deficits are appreciated.  Skin:  Skin is warm, dry and intact. No rash noted. Psychiatric: Mood and affect are normal. Speech and behavior are  normal.  ____________________________________________   LABS (all labs ordered are listed, but only abnormal results are displayed)  Labs Reviewed  BASIC METABOLIC PANEL - Abnormal; Notable for the following components:      Result Value   Glucose, Bld 222 (*)    Calcium 8.5 (*)    All other components within normal limits  CBC - Abnormal; Notable for the following components:   Hemoglobin 9.9 (*)    HCT 30.6 (*)    MCV 71.2 (*)    MCH 23.0 (*)    RDW 17.4 (*)    All other components within normal limits  BRAIN NATRIURETIC PEPTIDE - Abnormal; Notable for the following components:   B Natriuretic Peptide 259.0 (*)    All other components within normal limits  FIBRIN DERIVATIVES D-DIMER ([facility] ONLY) - Abnormal; Notable for the following components:   Fibrin derivatives D-dimer [facility]) 8,676.19 (*)    All other components within normal limits  TROPONIN I   ____________________________________________  EKG  Reviewed enterotomy at 1005 Heart rate 75 QRS 100 QTc 460 Normal sinus rhythm, no evidence of acute ischemia. ____________________________________________  RADIOLOGY  Chest x-ray result reviewed by me  Dg Chest 2 View  Result Date: 12/28/2017 CLINICAL DATA:  72 year old female with acute shortness of breath for 3 days. EXAM: CHEST - 2 VIEW COMPARISON:  10/29/2017 CT, 10/08/2017 radiographs and prior studies FINDINGS: Cardiomegaly and CABG/aortic valve replacement changes again noted. Interstitial prominence is unchanged. There is no evidence of focal airspace disease, pulmonary edema, suspicious pulmonary nodule/mass, pleural effusion, or pneumothorax. No acute bony abnormalities are identified. IMPRESSION: Cardiomegaly without evidence of acute cardiopulmonary disease. Electronically Signed   By: Margarette Canada M.D.   On: 12/28/2017 11:02   Dg Chest 2 View  Result Date: 12/28/2017 CLINICAL DATA:  72 year old female with acute shortness of breath for 3 days. EXAM: CHEST - 2  VIEW COMPARISON:  10/29/2017 CT, 10/08/2017 radiographs and prior studies FINDINGS: Cardiomegaly and CABG/aortic valve replacement changes again noted. Interstitial  prominence is unchanged. There is no evidence of focal airspace disease, pulmonary edema, suspicious pulmonary nodule/mass, pleural effusion, or pneumothorax. No acute bony abnormalities are identified. IMPRESSION: Cardiomegaly without evidence of acute cardiopulmonary disease. Electronically Signed   By: Margarette Canada M.D.   On: 12/28/2017 11:02   Ct Angio Chest Pe W And/or Wo Contrast  Result Date: 12/28/2017 CLINICAL DATA:  Chest pain.  Normal EKG.  Dyspnea. EXAM: CT ANGIOGRAPHY CHEST WITH CONTRAST TECHNIQUE: Multidetector CT imaging of the chest was performed using the standard protocol during bolus administration of intravenous contrast. Multiplanar CT image reconstructions and MIPs were obtained to evaluate the vascular anatomy. CONTRAST:  52mL ISOVUE-370 IOPAMIDOL (ISOVUE-370) INJECTION 76% COMPARISON:  Chest radiograph from earlier today. 10/29/2017 chest CT. FINDINGS: Cardiovascular: The study is high quality for the evaluation of pulmonary embolism. There are no filling defects in the central, lobar, segmental or subsegmental pulmonary artery branches to suggest acute pulmonary embolism. Atherosclerotic nonaneurysmal thoracic aorta. Ulcerated plaque throughout the thoracic aorta. Dilated main pulmonary artery (3.9 cm diameter). Mild cardiomegaly. Aortic valvular prosthesis is in place. No significant pericardial fluid/thickening. Three-vessel coronary atherosclerosis status post CABG. Mediastinum/Nodes: Thyroid is surgically absent. Unremarkable esophagus. No pathologically enlarged axillary, mediastinal or hilar lymph nodes. Lungs/Pleura: No pneumothorax. No pleural effusion. Anterior left lower lobe solid 3 mm pulmonary nodule (series 6/image 46) is stable since 07/19/2015 chest CT and considered benign. No acute consolidative airspace  disease, lung masses or new significant pulmonary nodules. Upper abdomen: Small hiatal hernia. Musculoskeletal: No aggressive appearing focal osseous lesions. Intact sternotomy wires. Mild thoracic spondylosis. Review of the MIP images confirms the above findings. IMPRESSION: 1. No pulmonary embolism. 2. No active pulmonary disease. 3. Mild cardiomegaly. Dilated main pulmonary artery, suggesting pulmonary arterial hypertension. 4. Small hiatal hernia. Aortic Atherosclerosis (ICD10-I70.0). Electronically Signed   By: Ilona Sorrel M.D.   On: 12/28/2017 12:29    ____________________________________________   PROCEDURES  Procedure(s) performed: None  Procedures  Critical Care performed: No  ____________________________________________   INITIAL IMPRESSION / ASSESSMENT AND PLAN / ED COURSE  Pertinent labs & imaging results that were available during my care of the patient were reviewed by me and considered in my medical decision making (see chart for details).  Patient resents for evaluation of shortness of breath and wheezing, also associated left-sided chest discomfort.  Hard to describe chest discomfort.  Does have a history of coronary disease, but her symptoms of cough low-grade fever been ongoing for about 3 days.  Hemodynamics are stable, mild use of accessory muscles.  Will give nebulizer treatment here as well as steroid.  Discussed drug allergies with the patient.  In addition, I suspect this is likely COPD with bronchitis but will exclude pneumonia.  Low risk for pulmonary embolism, reports no history of PE, no new leg swelling, no recent hospitalizations.  Will send a d-dimer for exclusion purposes.  Regarding her chest discomfort, symptoms seem atypical, but she does have a history of coronary disease and does report she had chest discomfort somewhat similar in the past when she and the pending to have a bypass.  Could represent an unstable anginal picture, but sounds more related to  pulmonary symptoms I suspect.  We will send troponin.  EKG without ischemic changes.   Clinical Course as of Dec 28 1400  Sat Dec 28, 2017  1159 Patient reports she felt better after treatment, but use the bathroom became quite dyspneic again.  She continues to have ongoing wheezing, will order additional albuterol.  Alert, sitting upright and continues accessory muscles.  D-dimer positive will obtain CT angios.  Patient reports overall she is feeling slightly better, but really got short of breath once again and is now improving after she attempted to ambulate to the bathroom back.   [MQ]  1329 Patient continues to endorse some dyspnea, worse with movement.  Sitting upright, still some mild use of accessory muscles and lung sounds have improved but still appears dyspneic beyond my comfort zone.  Chest pains improved.  Discussed case with pulmonary Dr. Vella Kohler, because of her ongoing dyspnea and increased work of breathing will admit her for further evaluation and consultation with pulmonologist.  I am slightly concerned that pulmonary hypertension may be present (chronic?), also some possible volume overload with elevated BNP, COPD exacerbation and will give a small dose of Lasix to start diuresis as well as nebulizers and steroids as already started.  Appears stable for admission and further work-up via hospitalist service.  Patient and husband agreeable and understanding of plan.   [MQ]    Clinical Course User Index [MQ] Delman Kitten, MD    Case discussed with Dr. Bridgett Larsson ____________________________________________   FINAL CLINICAL IMPRESSION(S) / ED DIAGNOSES  Final diagnoses:  Shortness of breath  COPD with acute exacerbation ([place])      NEW MEDICATIONS STARTED DURING THIS VISIT:  New Prescriptions   No medications on file     Note:  This document was prepared using Dragon voice recognition software and may include unintentional dictation errors.     Delman Kitten, MD 12/28/17  1403   Clinical history, EKG, troponin discussed with Dr. Angelena Form (cardiology) as well, reports she will be available, for consultation if requested by hospitalist at this time does not feel a strong indication for ACS and I agree.    Delman Kitten, MD 12/28/17 1430

## 2017-12-28 NOTE — ED Notes (Signed)
Pt oob void toilet

## 2017-12-28 NOTE — H&P (Addendum)
[facility] [place] NAME: [name]    MR#:  [id number]  DATE OF BIRTH:  05-08-1946  DATE OF ADMISSION:  12/28/2017  PRIMARY CARE PHYSICIAN: Glendon Axe, MD   REQUESTING/REFERRING PHYSICIAN: Dr. Jacqualine Code.  CHIEF COMPLAINT:   Chief Complaint  Patient presents with  . Shortness of Breath   Worsening shortness of breath for 3 days. HISTORY OF PRESENT ILLNESS:  [name]  is a 72 y.o. female with a known history of COPD, chronic diastolic CHF, CAD, hypertension, diabetes, anemia, OSA and morbid obesity etc. patient presents the ED with above chief complaints.  She also complains of wheezing, fever and a cough without sputum.  She use nebulizer without improvement at home.  She is found hypoxia and put on oxygen by nasal cannula, treated with IV Solu-Medrol and albuterol 3 times.  She still has shortness of breath and a few chest tightness.  CT angiogram did not show any PE but pulmonary hypertension.  Dr. Jacqualine Code contacted Dr. Raul Del for consult.  PAST MEDICAL HISTORY:   Past Medical History:  Diagnosis Date  . 1st degree AV block   . ACE-inhibitor cough   . Allergic rhinitis   . Anemia    iron deficiency anemia  . Anxiety   . Aortic ectasia (HCC)    a. CT abd in 12/2016 incidentally noted aortic atherosclerosis and infrarenal abdominal aortic ectasia measuring as large as 2.7 cm with recommendation to repeat US in 2023.  Marland Kitchen Arthritis   . Asthma   . Cataract   . Chronic depression   . Chronic diastolic CHF (congestive heart failure) ([place])   . Chronic headache   . COPD (chronic obstructive pulmonary disease) ([place])   . Coronary artery disease    a. DES to RCA and mid Cx 2009. b. CABG and bioprosthetic AVR May 2015. c. cutting balloon to prox Cx in 05/2016  . Diabetes mellitus    type 2  . Diverticulitis of colon   . Essential hypertension   . GERD (gastroesophageal reflux disease)   . Hearing loss   . History of blood  transfusion 2013  . History of prosthetic aortic valve replacement   . HOH (hard of hearing)   . Hypercholesterolemia    intolerance of statins and niaspan  . Mobitz type 1 second degree AV block   . OSA (obstructive sleep apnea)    mild, intolerant of cpap  . PAD (peripheral artery disease) ([place])    a. atherosclerosis by CT abd 12/2016 in [place].  Marland Kitchen PONV (postoperative nausea and vomiting)   . Statin intolerance   . Thyroid disease     PAST SURGICAL HISTORY:   Past Surgical History:  Procedure Laterality Date  . ABDOMINAL HYSTERECTOMY    . ABDOMINAL HYSTERECTOMY W/ PARTIAL VAGINACTOMY    . AORTIC VALVE REPLACEMENT N/A 10/12/2013   Procedure: AORTIC VALVE REPLACEMENT (AVR);  Surgeon: [name], MD;  Location: [facility];  Service: Open Heart Surgery;  Laterality: N/A;  . [place]  . BARTHOLIN GLAND CYST EXCISION    . BLADDER SUSPENSION    . BREAST BIOPSY Bilateral 09/11/2000   neg  . BREAST BIOPSY Left 07/24/2010   neg  . BREAST CYST EXCISION  1988   bilateral nonmalignant tumors, x3  . CARDIAC CATHETERIZATION    . CARDIAC CATHETERIZATION N/A 05/25/2016   Procedure: Coronary Balloon Angioplasty;  Surgeon: Leonie Man, MD;  Location: [facility];  Service:  Cardiovascular;  Laterality: N/A;  . CARDIAC CATHETERIZATION N/A 05/25/2016   Procedure: Coronary/Graft Angiography;  Surgeon: Leonie Man, MD;  Location: [facility];  Service: Cardiovascular;  Laterality: N/A;  . CATARACT EXTRACTION W/ INTRAOCULAR LENS  IMPLANT, BILATERAL    . CHOLECYSTECTOMY  2001  . COLECTOMY     lap sigmoid  . COLONOSCOPY  2014   polyps found, 2 clamped off.  . CORONARY ANGIOPLASTY  10/29/2007   Prox RCA & Mid Cx.  . CORONARY ARTERY BYPASS GRAFT N/A 10/12/2013   Procedure: CORONARY ARTERY BYPASS GRAFT TIMES TWO;  Surgeon: [name], MD;  Location: [facility];  Service: Open Heart Surgery;  Laterality: N/A;  . CORONARY/GRAFT ANGIOGRAPHY N/A 09/20/2017   Procedure:  CORONARY/GRAFT ANGIOGRAPHY;  Surgeon: Sherren Mocha, MD;  Location: [facility];  Service: Cardiovascular;  Laterality: N/A;  . LEFT HEART CATHETERIZATION WITH CORONARY ANGIOGRAM N/A 10/09/2013   Procedure: LEFT HEART CATHETERIZATION WITH CORONARY ANGIOGRAM;  Surgeon: [name], MD;  Location[facility];  Service: Cardiovascular;  Laterality: N/A;  . STERNAL WIRES REMOVAL N/A 04/13/2014   Procedure: STERNAL WIRES REMOVAL;  Surgeon: [name], MD;  Location: [facility];  Service: Thoracic;  Laterality: N/A;  . THYROIDECTOMY    . TONSILLECTOMY    . TUBAL LIGATION    . VAGINAL DELIVERY     3  . VISCERAL ARTERY INTERVENTION N/A 08/16/2016   Procedure: Visceral Artery Intervention;  Surgeon: Algernon Huxley, MD;  Location: [facility];  Service: Cardiovascular;  Laterality: N/A;    SOCIAL HISTORY:   Social History   Tobacco Use  . Smoking status: Former Smoker    Packs/day: 0.50    Years: 30.00    Pack years: 15.00    Types: Cigarettes    Last attempt to quit: 10/02/2013    Years since quitting: 4.2  . Smokeless tobacco: Never Used  Substance Use Topics  . Alcohol use: No    FAMILY HISTORY:   Family History  Problem Relation Age of Onset  . Breast cancer Mother 110  . Hypertension Father   . Mesothelioma Father   . Asthma Father   . Stroke Paternal Grandfather   . Heart disease Unknown   . Breast cancer Maternal Aunt   . Breast cancer Paternal Aunt     DRUG ALLERGIES:   Allergies  Allergen Reactions  . Amitriptyline Other (See Comments)    Unknown reaction  . Benadryl [Diphenhydramine] Shortness Of Breath  . Demerol [Meperidine] Other (See Comments)    Unknown reaction  . Gabapentin Other (See Comments)    Unknown reaction  . Loratadine Other (See Comments)    Unknown reaction  . Meperidine Hcl Other (See Comments)    Unknown reaction  . Mirtazapine Other (See Comments)    Unknown reaction  . Olanzapine Other (See Comments)    Unknown  reaction   . Voltaren [Diclofenac Sodium] Shortness Of Breath  . Zetia [Ezetimibe] Other (See Comments)    Weakness in legs, shakiness all over  . Ativan [Lorazepam] Other (See Comments)    Causes double vision at highter than .5 mg dose  . Atorvastatin Other (See Comments)    Muscle aches and weakness  . Budesonide-Formoterol Fumarate Other (See Comments)    Shakiness, tremors  . Bupropion Hcl Other (See Comments)    "cloud over me" depression  . Caffeine Other (See Comments)    jitters  . Codeine Sulfate Other (See Comments)  Makes chest hurt like a heart attack  . Lisinopril Cough  . Metformin Nausea And Vomiting  . Mometasone Furoate Nausea And Vomiting  . Morphine Sulfate Other (See Comments)    Chest pain like a heart attack  . Other Other (See Comments)    Beta Blockers, reaction shortness of breath  . Oxycodone-Acetaminophen Nausea And Vomiting  . Pioglitazone Other (See Comments)    Cannot take because of risk of bladder cancer  . Propoxyphene N-Acetaminophen Nausea And Vomiting  . Rosuvastatin Other (See Comments)    Muscle aches and weakness  . Shellfish Allergy Diarrhea  . Suvorexant Other (See Comments)    Jerking/nervous   . Ticagrelor     Other reaction(s): Other (See Comments) "slowed heart rate" & chest pain  . Tramadol Nausea Only  . Trazodone And Nefazodone Nausea And Vomiting  . Venlafaxine Other (See Comments)    Unknown reaction  . Zolpidem Tartrate Other (See Comments)     Jittery, diarrhea  . Latex Rash    REVIEW OF SYSTEMS:   Review of Systems  Constitutional: Positive for malaise/fatigue. Negative for chills and fever.  HENT: Negative for nosebleeds.   Eyes: Negative for blurred vision and double vision.  Respiratory: Positive for cough, shortness of breath and wheezing. Negative for hemoptysis and sputum production.   Cardiovascular: Positive for leg swelling. Negative for chest pain, palpitations and orthopnea.  Gastrointestinal:  Negative for abdominal pain, blood in stool, constipation, diarrhea, melena, nausea and vomiting.  Genitourinary: Negative for dysuria, flank pain, frequency, hematuria and urgency.  Musculoskeletal: Negative for falls and joint pain.  Skin: Negative for rash.  Neurological: Negative for dizziness, focal weakness, seizures, loss of consciousness, weakness and headaches.  Endo/Heme/Allergies: Negative for polydipsia. Does not bruise/bleed easily.  Psychiatric/Behavioral: Negative for depression.    MEDICATIONS AT HOME:   Prior to Admission medications   Medication Sig Start Date End Date Taking? Authorizing Provider  albuterol (PROAIR HFA) 108 (90 Base) MCG/ACT inhaler Inhale 2 puffs into the lungs every 4 (four) hours as needed for wheezing or shortness of breath. 07/17/16   Flora Lipps, MD  albuterol (PROVENTIL) (2.5 MG/3ML) 0.083% nebulizer solution Take 3 mLs (2.5 mg total) by nebulization every 6 (six) hours as needed for wheezing or shortness of breath. 09/23/17   Laverle Hobby, MD  amLODipine (NORVASC) 2.5 MG tablet Take 2.5 mg by mouth daily.    [provider]  aspirin EC 81 MG tablet Take 1 tablet (81 mg total) by mouth daily. 01/28/17   Dunn, Nedra Hai, PA-C  Calcium-Vitamin D (CALTRATE 600 PLUS-VIT D PO) Take 2 tablets by mouth 2 (two) times daily.     [provider]  FLUoxetine (PROZAC) 40 MG capsule Take 40 mg by mouth daily.  07/29/15   [provider]  insulin NPH-regular Human (NOVOLIN 70/30) (70-30) 100 UNIT/ML injection Inject 70 Units into the skin 2 (two) times daily.    [provider]  levothyroxine (SYNTHROID, LEVOTHROID) 137 MCG tablet Take 137 mcg by mouth daily.  04/03/17   [provider]  nitroGLYCERIN (NITROSTAT) 0.4 MG SL tablet Place 1 tablet (0.4 mg total) under the tongue every 5 (five) minutes as needed for chest pain. 07/25/17   Lyda Jester M, PA-C  ondansetron (ZOFRAN) 4 MG tablet Take 1 tablet (4 mg  total) by mouth every 6 (six) hours as needed for nausea. 08/24/16   Theodoro Grist, MD  pantoprazole (PROTONIX) 20 MG tablet Take 20 mg by mouth daily. 01/23/17  [provider]  umeclidinium bromide (INCRUSE ELLIPTA) 62.5 MCG/INH AEPB Inhale 1 puff into the lungs daily. 10/17/17   Flora Lipps, MD      VITAL SIGNS:  Blood pressure (!) 145/79, pulse 79, temperature 98.4 F (36.9 C), resp. rate 19, height 5\' 6"  (1.676 m), weight 195 lb (88.5 kg), SpO2 99 %.  PHYSICAL EXAMINATION:  Physical Exam  GENERAL:  72 y.o.-year-old patient lying in the bed with no acute distress.  EYES: Pupils equal, round, reactive to light and accommodation. No scleral icterus. Extraocular muscles intact.  HEENT: Head atraumatic, normocephalic. Oropharynx and nasopharynx clear.  NECK:  Supple, no jugular venous distention. No thyroid enlargement, no tenderness.  LUNGS: Diminished breath sounds bilaterally, no wheezing, rales,rhonchi or crepitation. No use of accessory muscles of respiration.  CARDIOVASCULAR: S1, S2 normal. No murmurs, rubs, or gallops.  ABDOMEN: Soft, nontender, nondistended. Bowel sounds present. No organomegaly or mass.  EXTREMITIES: No cyanosis, or clubbing.  Trace leg edema. NEUROLOGIC: Cranial nerves II through XII are intact. Muscle strength 5/5 in all extremities. Sensation intact. Gait not checked.  PSYCHIATRIC: The patient is alert and oriented x 3.  SKIN: No obvious rash, lesion, or ulcer.   LABORATORY PANEL:   CBC Recent Labs  Lab 12/28/17 1025  WBC 9.2  HGB 9.9*  HCT 30.6*  PLT 231   ------------------------------------------------------------------------------------------------------------------  Chemistries  Recent Labs  Lab 12/28/17 1025  NA 137  K 3.9  CL 102  CO2 25  GLUCOSE 222*  BUN 15  CREATININE 0.82  CALCIUM 8.5*   ------------------------------------------------------------------------------------------------------------------  Cardiac  Enzymes Recent Labs  Lab 12/28/17 1025  TROPONINI <0.03   ------------------------------------------------------------------------------------------------------------------  RADIOLOGY:  Dg Chest 2 View  Result Date: 12/28/2017 CLINICAL DATA:  72 year old female with acute shortness of breath for 3 days. EXAM: CHEST - 2 VIEW COMPARISON:  10/29/2017 CT, 10/08/2017 radiographs and prior studies FINDINGS: Cardiomegaly and CABG/aortic valve replacement changes again noted. Interstitial prominence is unchanged. There is no evidence of focal airspace disease, pulmonary edema, suspicious pulmonary nodule/mass, pleural effusion, or pneumothorax. No acute bony abnormalities are identified. IMPRESSION: Cardiomegaly without evidence of acute cardiopulmonary disease. Electronically Signed   By: Margarette Canada M.D.   On: 12/28/2017 11:02   Ct Angio Chest Pe W And/or Wo Contrast  Result Date: 12/28/2017 CLINICAL DATA:  Chest pain.  Normal EKG.  Dyspnea. EXAM: CT ANGIOGRAPHY CHEST WITH CONTRAST TECHNIQUE: Multidetector CT imaging of the chest was performed using the standard protocol during bolus administration of intravenous contrast. Multiplanar CT image reconstructions and MIPs were obtained to evaluate the vascular anatomy. CONTRAST:  62mL ISOVUE-370 IOPAMIDOL (ISOVUE-370) INJECTION 76% COMPARISON:  Chest radiograph from earlier today. 10/29/2017 chest CT. FINDINGS: Cardiovascular: The study is high quality for the evaluation of pulmonary embolism. There are no filling defects in the central, lobar, segmental or subsegmental pulmonary artery branches to suggest acute pulmonary embolism. Atherosclerotic nonaneurysmal thoracic aorta. Ulcerated plaque throughout the thoracic aorta. Dilated main pulmonary artery (3.9 cm diameter). Mild cardiomegaly. Aortic valvular prosthesis is in place. No significant pericardial fluid/thickening. Three-vessel coronary atherosclerosis status post CABG. Mediastinum/Nodes: Thyroid is  surgically absent. Unremarkable esophagus. No pathologically enlarged axillary, mediastinal or hilar lymph nodes. Lungs/Pleura: No pneumothorax. No pleural effusion. Anterior left lower lobe solid 3 mm pulmonary nodule (series 6/image 46) is stable since 07/19/2015 chest CT and considered benign. No acute consolidative airspace disease, lung masses or new significant pulmonary nodules. Upper abdomen: Small hiatal hernia. Musculoskeletal: No aggressive appearing focal osseous lesions. Intact sternotomy wires. Mild  thoracic spondylosis. Review of the MIP images confirms the above findings. IMPRESSION: 1. No pulmonary embolism. 2. No active pulmonary disease. 3. Mild cardiomegaly. Dilated main pulmonary artery, suggesting pulmonary arterial hypertension. 4. Small hiatal hernia. Aortic Atherosclerosis (ICD10-I70.0). Electronically Signed   By: Ilona Sorrel M.D.   On: 12/28/2017 12:29      IMPRESSION AND PLAN:   Acute respiratory failure with hypoxia due to COPD exacerbation and possible pulmonary hypertension.  The patient will be admitted to medical floor. Continue oxygen by nasal cannula, albuterol every 6 hours, IV Solu-Medrol and pulmonary consult from Dr. Raul Del.   Chronic diastolic CHF.  Stable.  Continue diuretics.  Hypertension.  Continue home hypertension medication. Diabetes type 2.  Start sliding scale and continue NovoLog 70/30. OSA and morbid obesity.  All the records are reviewed and case discussed with ED provider. Management plans discussed with the patient, her husband and they are in agreement.  CODE STATUS: Full code.  TOTAL TIME TAKING CARE OF THIS PATIENT: 46 minutes.    Demetrios Loll M.D on 12/28/2017 at 2:55 PM  Between 7am to 6pm - Pager - [phone]  After 6pm go to [email or web address] - Proofreader  [facility]  Office  [phone]  CC: Primary care physician; Glendon Axe, MD   Note: This dictation was prepared with Dragon  dictation along with smaller phrase technology. Any transcriptional errors that result from this process are unin

## 2017-12-28 NOTE — Progress Notes (Signed)
Advanced Care Plan.  Purpose of Encounter: CODE STATUS. Parties in Attendance: The patient, her husband to me. Patient's Decisional Capacity: Yes. Medical Story: [name]  is a 72 y.o. female with a known history of COPD, chronic diastolic CHF, CAD, hypertension, diabetes, anemia, OSA and morbid obesity etc.  she is being admitted for acute respiratory failure with hypoxia due to COPD exacerbation and pulmonary hypertension.  I discussed with her about her current condition, prognosis and the colder status.  She initially wanted DNR but changed her mind saying that her husband want her full code.  Plan:  Code Status: Full code. Time spent discussing advance care planning: 17 minutes.

## 2017-12-28 NOTE — ED Notes (Signed)
Void toilet

## 2017-12-28 NOTE — ED Triage Notes (Signed)
Pt arrives via POV from home with increased shortness of breath x 3 days. States she has been wheezing. Has albuterol inhaler, albuterol nebulizer treatments and a discus that she has been using with no relief. Also c/o left-sided chest pain. States this is what she felt like when she had her MI in 2015. Pt tachypneic at this time.

## 2017-12-29 LAB — BASIC METABOLIC PANEL
ANION GAP: 10 (ref 5–15)
BUN: 21 mg/dL (ref 8–23)
CHLORIDE: 101 mmol/L (ref 98–111)
CO2: 24 mmol/L (ref 22–32)
Calcium: 8.1 mg/dL — ABNORMAL LOW (ref 8.9–10.3)
Creatinine, Ser: 0.77 mg/dL (ref 0.44–1.00)
GFR calc non Af Amer: >60 mL/min (ref >60)
Glucose, Bld: 378 mg/dL — ABNORMAL HIGH (ref 70–99)
Potassium: 4.6 mmol/L (ref 3.5–5.1)
Sodium: 135 mmol/L (ref 135–145)

## 2017-12-29 LAB — GLUCOSE, CAPILLARY
GLUCOSE-CAPILLARY: 357 mg/dL — AB (ref 70–99)
GLUCOSE-CAPILLARY: 335 mg/dL — AB (ref 70–99)
Glucose-Capillary: 382 mg/dL — ABNORMAL HIGH (ref 70–99)
Glucose-Capillary: 307 mg/dL — ABNORMAL HIGH (ref 70–99)
Glucose-Capillary: 424 mg/dL — ABNORMAL HIGH (ref 70–99)
Glucose-Capillary: 434 mg/dL — ABNORMAL HIGH (ref 70–99)
Glucose-Capillary: 402 mg/dL — ABNORMAL HIGH (ref 70–99)

## 2017-12-29 LAB — CBC
HEMATOCRIT: 29.6 % — AB (ref 35.0–47.0)
HEMOGLOBIN: 9.4 g/dL — AB (ref 12.0–16.0)
MCH: 22.5 pg — AB (ref 26.0–34.0)
MCHC: 31.8 g/dL — ABNORMAL LOW (ref 32.0–36.0)
MCV: 70.7 fL — AB (ref 80.0–100.0)
Platelets: 223 10*3/uL (ref 150–440)
RBC: 4.18 MIL/uL (ref 3.80–5.20)
RDW: 18 % — ABNORMAL HIGH (ref 11.5–14.5)
WBC: 13.6 10*3/uL — ABNORMAL HIGH (ref 3.6–11.0)

## 2017-12-29 MED ORDER — ALPRAZOLAM 0.25 MG PO TABS
0.2500 mg | ORAL_TABLET | Freq: Three times a day (TID) | ORAL | Status: DC | PRN
  Administered 2017-12-29: 0.25 mg via ORAL
  Filled 2017-12-29: qty 1

## 2017-12-29 MED ORDER — INSULIN ASPART 100 UNIT/ML ~~LOC~~ SOLN
16.0000 [IU] | Freq: Once | SUBCUTANEOUS | Status: AC
  Administered 2017-12-29: 16 [IU] via SUBCUTANEOUS
  Filled 2017-12-29: qty 1

## 2017-12-29 MED ORDER — METHYLPREDNISOLONE SODIUM SUCC 125 MG IJ SOLR: 60.0000 mg | Freq: Every day | INTRAMUSCULAR | Status: DC

## 2017-12-29 MED ORDER — INSULIN ASPART 100 UNIT/ML ~~LOC~~ SOLN
10.0000 [IU] | Freq: Once | SUBCUTANEOUS | Status: AC
  Administered 2017-12-29: 10 [IU] via SUBCUTANEOUS
  Filled 2017-12-29: qty 1

## 2017-12-29 MED ORDER — INSULIN REGULAR HUMAN 100 UNIT/ML IJ SOLN
20.0000 [IU] | Freq: Once | INTRAMUSCULAR | Status: AC
  Administered 2017-12-29: 20 [IU] via INTRAVENOUS
  Filled 2017-12-29: qty 0.2

## 2017-12-29 MED ORDER — PREDNISONE 10 MG PO TABS: ORAL_TABLET | ORAL | 0 refills | Status: DC

## 2017-12-29 NOTE — Progress Notes (Signed)
Notified MD pt BG is 434. Per MD okay for RN to order 16 units of regular insulin. Per MD okay for RN to DC pt is BG comes down. Per pt her  BG normally runs in the 300's at home.

## 2017-12-29 NOTE — Progress Notes (Signed)
Notified MD pt BG is 402. Pt states she wants to go home, and will manage her sugar with her own insulin. Per MD okay to let pt go home. RN educated pt about importance of continuing to check her BG and drink fluids.

## 2017-12-29 NOTE — Progress Notes (Signed)
Notified MD pt BG 424 per MD okay for RN to cover pt with sliding scale. Will continue to monitor pt.

## 2017-12-29 NOTE — Progress Notes (Signed)
SATURATION QUALIFICATIONS: (This note is used to comply with regulatory documentation for home oxygen)  Patient Saturations on Room Air at Rest = 100   Patient Saturations on Room Air while Ambulating = 98   Patient Saturations on 0 Liters of oxygen while Ambulating = 95   Pt ambulated in the hall. Her O2 levels were in the 95% during ambulation. Pt become SOB but her O2 stats were normal.

## 2017-12-29 NOTE — Progress Notes (Signed)
Patient blood glucose elevated through out shift. Blood glucose at 2101 was 517, 2302 blood glucose 475, 0119 blood glucose 382, 0352 blood glucose 307 and 0654 blood glucose was 357. MD notified and he ordered regular insulin and one time dose of NovoLog given at [place]. See MAR. Patient resting in bed with not complaints.

## 2017-12-29 NOTE — Progress Notes (Signed)
Freada Bergeron  A and O x 4. VSS. Pt tolerating diet well. No complaints of pain or nausea. IV removed intact, no new prescriptions given. Pt voiced understanding of discharge instructions with no further questions. Pt discharged via wheelchair with nurse tech.    Allergies as of 12/29/2017      Reactions   Amitriptyline Other (See Comments)   Unknown reaction   Benadryl [diphenhydramine] Shortness Of Breath   Demerol [meperidine] Other (See Comments)   Unknown reaction   Gabapentin Other (See Comments)   Unknown reaction   Loratadine Other (See Comments)   Unknown reaction   Meperidine Hcl Other (See Comments)   Unknown reaction   Mirtazapine Other (See Comments)   Unknown reaction   Olanzapine Other (See Comments)   Unknown reaction   Voltaren [diclofenac Sodium] Shortness Of Breath   Zetia [ezetimibe] Other (See Comments)   Weakness in legs, shakiness all over   Ativan [lorazepam] Other (See Comments)   Causes double vision at highter than .5 mg dose   Atorvastatin Other (See Comments)   Muscle aches and weakness   Budesonide-formoterol Fumarate Other (See Comments)   Shakiness, tremors   Bupropion Hcl Other (See Comments)   "cloud over me" depression   Caffeine Other (See Comments)   jitters   Codeine Sulfate Other (See Comments)   Makes chest hurt like a heart attack   Lisinopril Cough   Metformin Nausea And Vomiting   Mometasone Furoate Nausea And Vomiting   Morphine Sulfate Other (See Comments)   Chest pain like a heart attack   Other Other (See Comments)   Beta Blockers, reaction shortness of breath   Oxycodone-acetaminophen Nausea And Vomiting   Pioglitazone Other (See Comments)   Cannot take because of risk of bladder cancer   Propoxyphene N-acetaminophen Nausea And Vomiting   Rosuvastatin Other (See Comments)   Muscle aches and weakness   Shellfish Allergy Diarrhea   Suvorexant Other (See Comments)   Jerking/nervous    Ticagrelor    Other reaction(s):  Other (See Comments) "slowed heart rate" & chest pain   Tramadol Nausea Only   Trazodone And Nefazodone Nausea And Vomiting   Venlafaxine Other (See Comments)   Unknown reaction   Zolpidem Tartrate Other (See Comments)    Jittery, diarrhea   Latex Rash      Medication List    TAKE these medications   albuterol (2.5 MG/3ML) 0.083% nebulizer solution Commonly known as:  PROVENTIL Take 3 mLs (2.5 mg total) by nebulization every 6 (six) hours as needed for wheezing or shortness of breath. What changed:  Another medication with the same name was removed. Continue taking this medication, and follow the directions you see here.   amLODipine 2.5 MG tablet Commonly known as:  NORVASC Take 2.5 mg by mouth daily.   aspirin EC 81 MG tablet Take 1 tablet (81 mg total) by mouth daily.   CALTRATE 600 PLUS-VIT D PO Take 2 tablets by mouth 2 (two) times daily.   FLUoxetine 40 MG capsule Commonly known as:  PROZAC Take 40 mg by mouth daily.   insulin NPH-regular Human (70-30) 100 UNIT/ML injection Commonly known as:  NOVOLIN 70/30 Inject 75u under the skin every morning and 95u under the skin every evening   levothyroxine 137 MCG tablet Commonly known as:  SYNTHROID, LEVOTHROID Take 137 mcg by mouth daily.   nitroGLYCERIN 0.4 MG SL tablet Commonly known as:  NITROSTAT Place 1 tablet (0.4 mg total) under the tongue every  5 (five) minutes as needed for chest pain.   ondansetron 4 MG tablet Commonly known as:  ZOFRAN Take 1 tablet (4 mg total) by mouth every 6 (six) hours as needed for nausea.   pantoprazole 20 MG tablet Commonly known as:  PROTONIX Take 20 mg by mouth daily.   umeclidinium bromide 62.5 MCG/INH Aepb Commonly known as:  INCRUSE ELLIPTA Inhale 1 puff into the lungs daily.       Vitals:   12/29/17 1255 12/29/17 1329  BP: 136/67   Pulse: 75   Resp: (!) 22   Temp: 98.3 F (36.8 C)   SpO2: 98% 95%    Francesco Sor

## 2017-12-29 NOTE — Discharge Summary (Addendum)
[facility] NAME: [name]    MR#:  [id number]  DATE OF BIRTH:  [birth year]  DATE OF ADMISSION:  12/28/2017 ADMITTING PHYSICIAN: Demetrios Loll, MD  DATE OF DISCHARGE: 12/29/2017  PRIMARY CARE PHYSICIAN: Glendon Axe, MD    ADMISSION DIAGNOSIS:  Shortness of breath [R06.02] COPD with acute exacerbation (HCC) [J44.1] Abnormal findings on diagnostic imaging of lung [R91.8] Stage 2 moderate COPD by GOLD classification ([place]) [J44.9]  DISCHARGE DIAGNOSIS:  Active Problems:   COPD exacerbation ([place])   SECONDARY DIAGNOSIS:   Past Medical History:  Diagnosis Date  . 1st degree AV block   . ACE-inhibitor cough   . Allergic rhinitis   . Anemia    iron deficiency anemia  . Anxiety   . Aortic ectasia (HCC)    a. CT abd in 12/2016 incidentally noted aortic atherosclerosis and infrarenal abdominal aortic ectasia measuring as large as 2.7 cm with recommendation to repeat US in 2023.  Marland Kitchen Arthritis   . Asthma   . Cataract   . Chronic depression   . Chronic diastolic CHF (congestive heart failure) ([place])   . Chronic headache   . COPD (chronic obstructive pulmonary disease) ([place])   . Coronary artery disease    a. DES to RCA and mid Cx 2009. b. CABG and bioprosthetic AVR May 2015. c. cutting balloon to prox Cx in 05/2016  . Diabetes mellitus    type 2  . Diverticulitis of colon   . Essential hypertension   . GERD (gastroesophageal reflux disease)   . Hearing loss   . History of blood transfusion 2013  . History of prosthetic aortic valve replacement   . HOH (hard of hearing)   . Hypercholesterolemia    intolerance of statins and niaspan  . Mobitz type 1 second degree AV block   . OSA (obstructive sleep apnea)    mild, intolerant of cpap  . PAD (peripheral artery disease) ([place])    a. atherosclerosis by CT abd 12/2016 in [place].  Marland Kitchen PONV (postoperative nausea and vomiting)   . Statin intolerance   . Thyroid disease     HOSPITAL COURSE:    72 year old female with past medical history of COPD, chronic diastolic CHF, peripheral artery disease, hypertension, hyperlipidemia, obstructive sleep apnea, anxiety who presents to the hospital due to shortness of breath.  1.  Shortness of breath- patient presented to the hospital with complaints of shortness of breath but she was not really hypoxic.  She underwent a chest x-ray which was negative for acute pathology.  Her CT chest showed no pulmonary embolism but suggestive signs of pulmonary hypertension. -Patient although was ambulated the day after admission and was not hypoxic.  She has had a recent cardiac work-up in April of this past year including a cardiac catheterization and echo, her cardiac catheterization showed three-vessel disease but her bypass grafts are patent, her echocardiogram showed no evidence of pulmonary hypertension with normal ejection fraction. - Some of her symptoms are likely anxiety mediated.  She is therefore being discharged home with follow-up with cardiology and pulmonary as an outpatient.  She can have a right heart catheterization done as an outpatient if she continues to have shortness of breath for work-up of her possible pulmonary hypertension.  2.  Hyperglycemia-secondary to patient receiving some IV steroids. - Patient was treated with her schedule insulin with also sliding scale coverage.  She will resume her Novolin 70/30 upon discharge.  3.  COPD- mild acute  exacerbation contributing to her shortness of breath. -Patient was given some IV steroids, but her blood sugars have significantly gotten up with steroids.  She was going to be discharged on a prednisone taper but she is really not bronchospastic and therefore I am discontinuing her steroid taper, she will continue her home inhalers as stated below.  4.  Hypothyroidism-patient will continue with Synthroid.  5.  GERD-patient will continue Protonix.  6.  Depression-patient will resume her  Prozac.  DISCHARGE CONDITIONS:   Stable.   CONSULTS OBTAINED:    DRUG ALLERGIES:   Allergies  Allergen Reactions  . Amitriptyline Other (See Comments)    Unknown reaction  . Benadryl [Diphenhydramine] Shortness Of Breath  . Demerol [Meperidine] Other (See Comments)    Unknown reaction  . Gabapentin Other (See Comments)    Unknown reaction  . Loratadine Other (See Comments)    Unknown reaction  . Meperidine Hcl Other (See Comments)    Unknown reaction  . Mirtazapine Other (See Comments)    Unknown reaction  . Olanzapine Other (See Comments)    Unknown reaction   . Voltaren [Diclofenac Sodium] Shortness Of Breath  . Zetia [Ezetimibe] Other (See Comments)    Weakness in legs, shakiness all over  . Ativan [Lorazepam] Other (See Comments)    Causes double vision at highter than .5 mg dose  . Atorvastatin Other (See Comments)    Muscle aches and weakness  . Budesonide-Formoterol Fumarate Other (See Comments)    Shakiness, tremors  . Bupropion Hcl Other (See Comments)    "cloud over me" depression  . Caffeine Other (See Comments)    jitters  . Codeine Sulfate Other (See Comments)    Makes chest hurt like a heart attack  . Lisinopril Cough  . Metformin Nausea And Vomiting  . Mometasone Furoate Nausea And Vomiting  . Morphine Sulfate Other (See Comments)    Chest pain like a heart attack  . Other Other (See Comments)    Beta Blockers, reaction shortness of breath  . Oxycodone-Acetaminophen Nausea And Vomiting  . Pioglitazone Other (See Comments)    Cannot take because of risk of bladder cancer  . Propoxyphene N-Acetaminophen Nausea And Vomiting  . Rosuvastatin Other (See Comments)    Muscle aches and weakness  . Shellfish Allergy Diarrhea  . Suvorexant Other (See Comments)    Jerking/nervous   . Ticagrelor     Other reaction(s): Other (See Comments) "slowed heart rate" & chest pain  . Tramadol Nausea Only  . Trazodone And Nefazodone Nausea And Vomiting  .  Venlafaxine Other (See Comments)    Unknown reaction  . Zolpidem Tartrate Other (See Comments)     Jittery, diarrhea  . Latex Rash    DISCHARGE MEDICATIONS:   Allergies as of 12/29/2017      Reactions   Amitriptyline Other (See Comments)   Unknown reaction   Benadryl [diphenhydramine] Shortness Of Breath   Demerol [meperidine] Other (See Comments)   Unknown reaction   Gabapentin Other (See Comments)   Unknown reaction   Loratadine Other (See Comments)   Unknown reaction   Meperidine Hcl Other (See Comments)   Unknown reaction   Mirtazapine Other (See Comments)   Unknown reaction   Olanzapine Other (See Comments)   Unknown reaction   Voltaren [diclofenac Sodium] Shortness Of Breath   Zetia [ezetimibe] Other (See Comments)   Weakness in legs, shakiness all over   Ativan [lorazepam] Other (See Comments)   Causes double vision at highter than .5  mg dose   Atorvastatin Other (See Comments)   Muscle aches and weakness   Budesonide-formoterol Fumarate Other (See Comments)   Shakiness, tremors   Bupropion Hcl Other (See Comments)   "cloud over me" depression   Caffeine Other (See Comments)   jitters   Codeine Sulfate Other (See Comments)   Makes chest hurt like a heart attack   Lisinopril Cough   Metformin Nausea And Vomiting   Mometasone Furoate Nausea And Vomiting   Morphine Sulfate Other (See Comments)   Chest pain like a heart attack   Other Other (See Comments)   Beta Blockers, reaction shortness of breath   Oxycodone-acetaminophen Nausea And Vomiting   Pioglitazone Other (See Comments)   Cannot take because of risk of bladder cancer   Propoxyphene N-acetaminophen Nausea And Vomiting   Rosuvastatin Other (See Comments)   Muscle aches and weakness   Shellfish Allergy Diarrhea   Suvorexant Other (See Comments)   Jerking/nervous    Ticagrelor    Other reaction(s): Other (See Comments) "slowed heart rate" & chest pain   Tramadol Nausea Only   Trazodone And  Nefazodone Nausea And Vomiting   Venlafaxine Other (See Comments)   Unknown reaction   Zolpidem Tartrate Other (See Comments)    Jittery, diarrhea   Latex Rash      Medication List    TAKE these medications   albuterol (2.5 MG/3ML) 0.083% nebulizer solution Commonly known as:  PROVENTIL Take 3 mLs (2.5 mg total) by nebulization every 6 (six) hours as needed for wheezing or shortness of breath. What changed:  Another medication with the same name was removed. Continue taking this medication, and follow the directions you see here.   amLODipine 2.5 MG tablet Commonly known as:  NORVASC Take 2.5 mg by mouth daily.   aspirin EC 81 MG tablet Take 1 tablet (81 mg total) by mouth daily.   CALTRATE 600 PLUS-VIT D PO Take 2 tablets by mouth 2 (two) times daily.   FLUoxetine 40 MG capsule Commonly known as:  PROZAC Take 40 mg by mouth daily.   insulin NPH-regular Human (70-30) 100 UNIT/ML injection Commonly known as:  NOVOLIN 70/30 Inject 75u under the skin every morning and 95u under the skin every evening   levothyroxine 137 MCG tablet Commonly known as:  SYNTHROID, LEVOTHROID Take 137 mcg by mouth daily.   nitroGLYCERIN 0.4 MG SL tablet Commonly known as:  NITROSTAT Place 1 tablet (0.4 mg total) under the tongue every 5 (five) minutes as needed for chest pain.   ondansetron 4 MG tablet Commonly known as:  ZOFRAN Take 1 tablet (4 mg total) by mouth every 6 (six) hours as needed for nausea.   pantoprazole 20 MG tablet Commonly known as:  PROTONIX Take 20 mg by mouth daily.   umeclidinium bromide 62.5 MCG/INH Aepb Commonly known as:  INCRUSE ELLIPTA Inhale 1 puff into the lungs daily.         DISCHARGE INSTRUCTIONS:   DIET:  Cardiac diet and Diabetic diet  DISCHARGE CONDITION:  Stable  ACTIVITY:  Activity as tolerated  OXYGEN:  Home Oxygen: No.   Oxygen Delivery: room air  DISCHARGE LOCATION:  home   If you experience worsening of your admission  symptoms, develop shortness of breath, life threatening emergency, suicidal or homicidal thoughts you must seek medical attention immediately by calling 911 or calling your MD immediately  if symptoms less severe.  You Must read complete instructions/literature along with all the possible adverse reactions/side effects for  all the Medicines you take and that have been prescribed to you. Take any new Medicines after you have completely understood and accpet all the possible adverse reactions/side effects.   Please note  You were cared for by a hospitalist during your hospital stay. If you have any questions about your discharge medications or the care you received while you were in the hospital after you are discharged, you can call the unit and asked to speak with the hospitalist on call if the hospitalist that took care of you is not available. Once you are discharged, your primary care physician will handle any further medical issues. Please note that NO REFILLS for any discharge medications will be authorized once you are discharged, as it is imperative that you return to your primary care physician (or establish a relationship with a primary care physician if you do not have one) for your aftercare needs so that they can reassess your need for medications and monitor your lab values.     Today   Still having shortness of breath but not hypoxic. BS are high due to steroids.  No other complaints.   VITAL SIGNS:  Blood pressure 136/67, pulse 75, temperature 98.3 F (36.8 C), temperature source Oral, resp. rate (!) 22, height 5\' 6"  (1.676 m), weight 89.2 kg (196 lb 10.4 oz), SpO2 95 %.  I/O:    Intake/Output Summary (Last 24 hours) at 12/29/2017 1518 Last data filed at 12/29/2017 1040 Gross per 24 hour  Intake 240 ml  Output 850 ml  Net -610 ml    PHYSICAL EXAMINATION:  GENERAL:  72 y.o.-year-old patient lying in the bed in no acute distress.  EYES: Pupils equal, round, reactive to  light and accommodation. No scleral icterus. Extraocular muscles intact.  HEENT: Head atraumatic, normocephalic. Oropharynx and nasopharynx clear.  NECK:  Supple, no jugular venous distention. No thyroid enlargement, no tenderness.  LUNGS: Normal breath sounds bilaterally, no wheezing, rales,rhonchi. No use of accessory muscles of respiration.  CARDIOVASCULAR: S1, S2 normal. No murmurs, rubs, or gallops.  ABDOMEN: Soft, non-tender, non-distended. Bowel sounds present. No organomegaly or mass.  EXTREMITIES: No pedal edema, cyanosis, or clubbing.  NEUROLOGIC: Cranial nerves II through XII are intact. No focal motor or sensory defecits b/l. Anxious PSYCHIATRIC: The patient is alert and oriented x 3. SKIN: No obvious rash, lesion, or ulcer.   DATA REVIEW:   CBC Recent Labs  Lab 12/29/17 0619  WBC 13.6*  HGB 9.4*  HCT 29.6*  PLT 223    Chemistries  Recent Labs  Lab 12/29/17 0619  NA 135  K 4.6  CL 101  CO2 24  GLUCOSE 378*  BUN 21  CREATININE 0.77  CALCIUM 8.1*    Cardiac Enzymes Recent Labs  Lab 12/28/17 1025  TROPONINI <0.03    Microbiology Results  Results for orders placed or performed during the hospital encounter of 10/08/17  Urine culture     Status: None   Collection Time: 10/08/17  3:00 AM  Result Value Ref Range Status   Specimen Description   Final    URINE, RANDOM Performed at [facility], [address], [place], [place] [address]    Special Requests   Final    NONE Performed at [facility], [address] [place],  [address]    Culture   Final    NO GROWTH Performed at [facility], [place] [address] [place],  [address]    Report Status 10/09/2017 FINAL  Final  Culture, blood (routine x 2)     Status: None   Collection Time: 10/08/17  5:25 AM  Result Value Ref Range Status   Specimen Description BLOOD RIGHT AC  Final   Special Requests   Final    BOTTLES DRAWN AEROBIC AND ANAEROBIC Blood Culture  results may not be optimal due to an excessive volume of blood received in culture bottles   Culture   Final    NO GROWTH 5 DAYS Performed at [facility], [address] [place], [place] [address]    Report Status 10/13/2017 FINAL  Final  Culture, blood (routine x 2)     Status: None   Collection Time: 10/08/17  5:25 AM  Result Value Ref Range Status   Specimen Description BLOOD LEFT HAND  Final   Special Requests   Final    BOTTLES DRAWN AEROBIC AND ANAEROBIC Blood Culture adequate volume   Culture   Final    NO GROWTH 5 DAYS Performed at [facility], [address] [place], [place] [address]    Report Status 10/13/2017 FINAL  Final    RADIOLOGY:  Dg Chest 2 View  Result Date: 12/28/2017 CLINICAL DATA:  72 year old female with acute shortness of breath for 3 days. EXAM: CHEST - 2 VIEW COMPARISON:  10/29/2017 CT, 10/08/2017 radiographs and prior studies FINDINGS: Cardiomegaly and CABG/aortic valve replacement changes again noted. Interstitial prominence is unchanged. There is no evidence of focal airspace disease, pulmonary edema, suspicious pulmonary nodule/mass, pleural effusion, or pneumothorax. No acute bony abnormalities are identified. IMPRESSION: Cardiomegaly without evidence of acute cardiopulmonary disease. Electronically Signed   By: Margarette Canada M.D.   On: 12/28/2017 11:02   Ct Angio Chest Pe W And/or Wo Contrast  Result Date: 12/28/2017 CLINICAL DATA:  Chest pain.  Normal EKG.  Dyspnea. EXAM: CT ANGIOGRAPHY CHEST WITH CONTRAST TECHNIQUE: Multidetector CT imaging of the chest was performed using the standard protocol during bolus administration of intravenous contrast. Multiplanar CT image reconstructions and MIPs were obtained to evaluate the vascular anatomy. CONTRAST:  35mL ISOVUE-370 IOPAMIDOL (ISOVUE-370) INJECTION 76% COMPARISON:  Chest radiograph from earlier today. 10/29/2017 chest CT. FINDINGS: Cardiovascular: The study is high quality for the  evaluation of pulmonary embolism. There are no filling defects in the central, lobar, segmental or subsegmental pulmonary artery branches to suggest acute pulmonary embolism. Atherosclerotic nonaneurysmal thoracic aorta. Ulcerated plaque throughout the thoracic aorta. Dilated main pulmonary artery (3.9 cm diameter). Mild cardiomegaly. Aortic valvular prosthesis is in place. No significant pericardial fluid/thickening. Three-vessel coronary atherosclerosis status post CABG. Mediastinum/Nodes: Thyroid is surgically absent. Unremarkable esophagus. No pathologically enlarged axillary, mediastinal or hilar lymph nodes. Lungs/Pleura: No pneumothorax. No pleural effusion. Anterior left lower lobe solid 3 mm pulmonary nodule (series 6/image 46) is stable since 07/19/2015 chest CT and considered benign. No acute consolidative airspace disease, lung masses or new significant pulmonary nodules. Upper abdomen: Small hiatal hernia. Musculoskeletal: No aggressive appearing focal osseous lesions. Intact sternotomy wires. Mild thoracic spondylosis. Review of the MIP images confirms the above findings. IMPRESSION: 1. No pulmonary embolism. 2. No active pulmonary disease. 3. Mild cardiomegaly. Dilated main pulmonary artery, suggesting pulmonary arterial hypertension. 4. Small hiatal hernia. Aortic Atherosclerosis (ICD10-I70.0). Electronically Signed   By: Ilona Sorrel M.D.   On: 12/28/2017 12:29      Management plans discussed with the patient, family and they are in agreement.  CODE STATUS:     Code Status Orders  (From admission, onward)        Start  Ordered   12/28/17 1430  Full code  Continuous     12/28/17 1429     TOTAL TIME TAKING CARE OF THIS PATIENT: 40 minutes.    Henreitta Leber M.D on 12/29/2017 at 3:18 PM  Between 7am to 6pm - Pager - [phone]  After 6pm go to [email or web address] - Proofreader  [facility]  Office  [phone]  CC: Primary care  physician; Glendon Axe, MD

## 2018-03-04 ENCOUNTER — Emergency Department
Admission: EM | Admit: 2018-03-04 | Discharge: 2018-03-04 | Discharge status: Against Medical Advice | Payer: Medicare HMO | Attending: Student in an Organized Health Care Education/Training Program | Admitting: Student in an Organized Health Care Education/Training Program

## 2018-03-04 ENCOUNTER — Emergency Department: Payer: Medicare HMO

## 2018-03-04 ENCOUNTER — Other Ambulatory Visit: Payer: Self-pay

## 2018-03-04 ENCOUNTER — Encounter: Payer: Self-pay | Admitting: Emergency Medicine

## 2018-03-04 ENCOUNTER — Encounter: Payer: Self-pay | Admitting: Internal Medicine

## 2018-03-04 ENCOUNTER — Ambulatory Visit: Payer: Medicare HMO | Admitting: Internal Medicine

## 2018-03-04 VITALS — BP 140/62 | HR 80 | Resp 16 | Ht 66.0 in | Wt 199.0 lb

## 2018-03-04 DIAGNOSIS — Z7982 Long term (current) use of aspirin: Secondary | ICD-10-CM | POA: Diagnosis not present

## 2018-03-04 DIAGNOSIS — R0603 Acute respiratory distress: Secondary | ICD-10-CM | POA: Diagnosis not present

## 2018-03-04 DIAGNOSIS — R079 Chest pain, unspecified: Secondary | ICD-10-CM | POA: Insufficient documentation

## 2018-03-04 DIAGNOSIS — J449 Chronic obstructive pulmonary disease, unspecified: Secondary | ICD-10-CM | POA: Diagnosis not present

## 2018-03-04 DIAGNOSIS — Z951 Presence of aortocoronary bypass graft: Secondary | ICD-10-CM | POA: Diagnosis not present

## 2018-03-04 DIAGNOSIS — Z87891 Personal history of nicotine dependence: Secondary | ICD-10-CM | POA: Diagnosis not present

## 2018-03-04 DIAGNOSIS — J910 Malignant pleural effusion: Secondary | ICD-10-CM

## 2018-03-04 DIAGNOSIS — Z79899 Other long term (current) drug therapy: Secondary | ICD-10-CM | POA: Insufficient documentation

## 2018-03-04 DIAGNOSIS — J9601 Acute respiratory failure with hypoxia: Secondary | ICD-10-CM

## 2018-03-04 DIAGNOSIS — J45909 Unspecified asthma, uncomplicated: Secondary | ICD-10-CM | POA: Diagnosis not present

## 2018-03-04 DIAGNOSIS — I5032 Chronic diastolic (congestive) heart failure: Secondary | ICD-10-CM | POA: Insufficient documentation

## 2018-03-04 DIAGNOSIS — Z794 Long term (current) use of insulin: Secondary | ICD-10-CM | POA: Diagnosis not present

## 2018-03-04 DIAGNOSIS — I110 Hypertensive heart disease with heart failure: Secondary | ICD-10-CM | POA: Insufficient documentation

## 2018-03-04 DIAGNOSIS — E119 Type 2 diabetes mellitus without complications: Secondary | ICD-10-CM | POA: Insufficient documentation

## 2018-03-04 DIAGNOSIS — R0600 Dyspnea, unspecified: Secondary | ICD-10-CM | POA: Diagnosis present

## 2018-03-04 LAB — BASIC METABOLIC PANEL
ANION GAP: 10 (ref 5–15)
BUN: 19 mg/dL (ref 8–23)
CO2: 22 mmol/L (ref 22–32)
Calcium: 8.4 mg/dL — ABNORMAL LOW (ref 8.9–10.3)
Chloride: 104 mmol/L (ref 98–111)
Creatinine, Ser: 0.81 mg/dL (ref 0.44–1.00)
GFR calc Af Amer: >60 mL/min (ref >60)
Glucose, Bld: 192 mg/dL — ABNORMAL HIGH (ref 70–99)
POTASSIUM: 4.2 mmol/L (ref 3.5–5.1)
SODIUM: 136 mmol/L (ref 135–145)

## 2018-03-04 LAB — CBC
HCT: 28.7 % — ABNORMAL LOW (ref 35.0–47.0)
Hemoglobin: 9.5 g/dL — ABNORMAL LOW (ref 12.0–16.0)
MCH: 23.8 pg — AB (ref 26.0–34.0)
MCHC: 33.1 g/dL (ref 32.0–36.0)
MCV: 71.9 fL — ABNORMAL LOW (ref 80.0–100.0)
Platelets: 181 10*3/uL (ref 150–440)
RBC: 3.99 MIL/uL (ref 3.80–5.20)
RDW: 19 % — ABNORMAL HIGH (ref 11.5–14.5)
WBC: 11.1 10*3/uL — ABNORMAL HIGH (ref 3.6–11.0)

## 2018-03-04 LAB — TROPONIN I

## 2018-03-04 LAB — FIBRIN DERIVATIVES D-DIMER (ARMC ONLY): Fibrin derivatives D-dimer (ARMC): 1031.2 ng/mL (FEU) — ABNORMAL HIGH (ref 0.00–499.00)

## 2018-03-04 MED ORDER — LORAZEPAM 0.5 MG PO TABS
0.5000 mg | ORAL_TABLET | Freq: Once | ORAL | Status: AC
  Administered 2018-03-04: 0.5 mg via ORAL
  Filled 2018-03-04: qty 1

## 2018-03-04 MED ORDER — NITROGLYCERIN 2 % TD OINT
0.5000 [in_us] | TOPICAL_OINTMENT | Freq: Once | TRANSDERMAL | Status: DC
  Filled 2018-03-04: qty 1

## 2018-03-04 NOTE — ED Triage Notes (Signed)
Pt to ED from pulmonologist office and was sent over for eval of CP. PT with c/o CP since yesterday described as pressure, pt tachpnic upon arrival.  VSS.

## 2018-03-04 NOTE — Addendum Note (Signed)
Addended by: Stephanie Coup on: 03/04/2018 04:12 PM   Modules accepted: Orders

## 2018-03-04 NOTE — Progress Notes (Signed)
[facility]     Date: 03/04/2018,   MRN# [id number] [name] 1945/11/16 Code Status:  Code Status History    Date Active Date Inactive Code Status Order ID Comments User Context   07/24/2014 12:10 AM 07/24/2014  7:45 PM Full Code [id number]  Manus Gunning, MD Inpatient   10/12/2013  8:32 PM 10/15/2013  7:36 PM Full Code [id number]  John Giovanni, PA-C Inpatient   10/09/2013  7:30 PM 10/12/2013  8:32 PM Full Code [id number]  [name], MD Inpatient   10/08/2013 11:12 PM 10/09/2013  7:30 PM Full Code [id number]  Stephani Police, MD ED           AdmissionWeight: 199 lb (90.3 kg)                 CurrentWeight: 199 lb (90.3 kg) [name] is a 72 y.o. old female seen in consultation for SOB and cough.     CHIEF COMPLAINT:   Cough and wheezing   HISTORY OF PRESENT ILLNESS  72 yo white female with long standing smoking history apporx 30 pack year with COPD. She normally sees Dr. Mortimer Fries. She is coming in today as an acute visit, she notes that her breathing has been rough for the past 2 days, she notes that her oxygen drops into the low 90's. She feels that her legs have a lot of fluid and her belly feels bloated.  However on examination today she has only 1-2+ lower extremity edema, her lungs are clear today., She is taking incruze once daily and albuterol which she does not feel is helping.   **Desat walk 03/04/18>> beginning sat was 98%, HR 98. Pt ambulated 360 feet with slow pace, appeared in respiratory distress, however remained conversation, complaining often of her dyspnea. End of test sat was 98% and HR 114. Complained of chest pain at end of test.  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 She is still not smoking.  worked in Special educational needs teacher for 10 years     Current Medication:  Current Outpatient Medications:  .  albuterol  (PROVENTIL) (2.5 MG/3ML) 0.083% nebulizer solution, Take 3 mLs (2.5 mg total) by nebulization every 6 (six) hours as needed for wheezing or shortness of breath., Disp: 75 mL, Rfl: 12 .  amLODipine (NORVASC) 2.5 MG tablet, Take 2.5 mg by mouth daily., Disp: , Rfl:  .  aspirin EC 81 MG tablet, Take 1 tablet (81 mg total) by mouth daily., Disp: 90 tablet, Rfl: 3 .  Calcium-Vitamin D (CALTRATE 600 PLUS-VIT D PO), Take 2 tablets by mouth 2 (two) times daily. , Disp: , Rfl:  .  FLUoxetine (PROZAC) 40 MG capsule, Take 40 mg by mouth daily. , Disp: , Rfl:  .  insulin NPH-regular Human (NOVOLIN 70/30) (70-30) 100 UNIT/ML injection, Inject 75u under the skin every morning and 95u under the skin every evening, Disp: , Rfl:  .  levothyroxine (SYNTHROID, LEVOTHROID) 137 MCG tablet, Take 137 mcg by mouth daily. , Disp: , Rfl:  .  nitroGLYCERIN (NITROSTAT) 0.4 MG SL tablet, Place 1 tablet (0.4 mg total) under the tongue every 5 (five) minutes as needed for chest pain., Disp: 25 tablet, Rfl: 1 .  ondansetron (ZOFRAN) 4 MG tablet, Take 1 tablet (4 mg total) by mouth every 6 (six) hours as needed for nausea.,  Disp: 20 tablet, Rfl: 0 .  pantoprazole (PROTONIX) 20 MG tablet, Take 20 mg by mouth daily., Disp: , Rfl:  .  umeclidinium bromide (INCRUSE ELLIPTA) 62.5 MCG/INH AEPB, Inhale 1 puff into the lungs daily., Disp: 1 each, Rfl: 12  Current Facility-Administered Medications:  .  umeclidinium bromide (INCRUSE ELLIPTA) 62.5 MCG/INH 1 puff, 1 puff, Inhalation, Daily, Kasa, Kurian, MD    ALLERGIES   Amitriptyline; Benadryl [diphenhydramine]; Demerol [meperidine]; Gabapentin; Loratadine; Meperidine hcl; Mirtazapine; Olanzapine; Voltaren [diclofenac sodium]; Zetia [ezetimibe]; Ativan [lorazepam]; Atorvastatin; Budesonide-formoterol fumarate; Bupropion hcl; Caffeine; Codeine sulfate; Lisinopril; Metformin; Mometasone furoate; Morphine sulfate; Other; Oxycodone-acetaminophen; Pioglitazone; Propoxyphene n-acetaminophen;  Rosuvastatin; Shellfish allergy; Suvorexant; Ticagrelor; Tramadol; Trazodone and nefazodone; Venlafaxine; Zolpidem tartrate; and Latex     REVIEW OF SYSTEMS   Review of Systems  Constitutional: Negative for chills, fever, malaise/fatigue and weight loss.  HENT: Negative for congestion and sore throat.   Respiratory: Positive for shortness of breath and wheezing. Negative for cough, hemoptysis and sputum production.   Cardiovascular: Negative for leg swelling.  Musculoskeletal: Negative.   Skin: Negative for rash.  Neurological: Negative for headaches.  Psychiatric/Behavioral: The patient is nervous/anxious.   All other systems reviewed and are negative.    VS: BP 140/62 (BP Location: Left Arm, Cuff Size: Normal)   Pulse 80   Resp 16   Ht 5\' 6"  (1.676 m)   Wt 199 lb (90.3 kg)   SpO2 99%   BMI 32.12 kg/m      PHYSICAL EXAM  Physical Exam  Constitutional: She is oriented to person, place, and time. She appears well-developed and well-nourished. No distress.  HENT:  Mouth/Throat: No oropharyngeal exudate.  Eyes: Pupils are equal, round, and reactive to light. EOM are normal.  Cardiovascular: Normal rate, regular rhythm and normal heart sounds.  No murmur heard. Pulmonary/Chest: No stridor. No respiratory distress. She has no wheezes. She has no rales.  Musculoskeletal: Normal range of motion. She exhibits no edema.  Neurological: She is alert and oriented to person, place, and time.  Skin: Skin is warm. She is not diaphoretic.  Psychiatric: She has a normal mood and affect.           IMAGING    No results found.   CXR on 2/14 images reveiwed 03/04/2018   ASSESSMENT/PLAN   72 yo white female with long standing smoking history with findings suggestive of Moderate -Severe COPD Gold Stage B Today she appears to have dyspnea, possible acute bronchitis, though her lungs are clear, and she has minimal LE edema. Doubt pneumonia or pulm edema.   -Acute respiratory  distress. -Doubt that this represents acute bronchitis, acute COPD exacerbation, the patient has no wheezing with clear lungs. -Patient was ambulate in the office today with oxygen saturation remaining at 98% after walking 360 feet, she has considerable dyspnea/distress/anxiety. -Chest pain developed on ambulation, patient says she does this every time she ambulates, and takes about 30 minutes to resolve.  Cannot rule out ACS. - Patient notes excess lower extremity swelling, abdominal swelling, she feels that she has fluid in her lungs.  Patient was reassured that she has minimal swelling in her legs, and her lungs sound clear, I doubt that she has significant pulmonary edema at this time.  Will refer to emergency department, for further work-up due to chest pain, to rule out ACS.  Follow up in 3 months with Dr. Mortimer Fries.  Marda Stalker, M.D., F.C.C.P.  Board Certified in Internal Medicine, Pulmonary Medicine, [place], and Sleep Medicine.  [facility] Office Number: [phone]

## 2018-03-04 NOTE — ED Provider Notes (Signed)
[facility] Emergency Department Provider Note    First MD Initiated Contact with Patient 03/04/18 1832     (approximate)  I have reviewed the triage vital signs and the nursing notes.   HISTORY  Chief Complaint Chest Pain    HPI [name] is a 72 y.o. female presents the ER for evaluation of exertional dyspnea and exertional chest pain and tightness.  States that symptoms have been ongoing for several weeks but progressively getting worse.  Has had extensive cardiac evaluation as well as pulmonology evaluation.  Does have a history of chronic COPD but does not require home oxygen.  Was seen in pulmonology clinic today.  During saturation evaluation with ambulation she did not become hypoxic but was tachycardic and was complaining of chest discomfort.  She was sent to the ER for further evaluation.  States she does still feel some chest discomfort comfort with exertion but when she lays down and rests for a while her symptoms improved.  Denies any fevers.  Denies any blood thinners   CTA 7/19: IMPRESSION: 1. No pulmonary embolism. 2. No active pulmonary disease. 3. Mild cardiomegaly. Dilated main pulmonary artery, suggesting pulmonary arterial hypertension. 4. Small hiatal hernia.  Echo 4/19: Study Conclusions  - Procedure narrative: Transthoracic echocardiography. Image   quality was adequate. The study was technically difficult. - Left ventricle: The cavity size was normal. There was mild   concentric hypertrophy. Systolic function was normal. The   estimated ejection fraction was in the range of 55% to 60%. Wall   motion was normal; there were no regional wall motion   abnormalities. Features are consistent with a pseudonormal left   ventricular filling pattern, with concomitant abnormal relaxation   and increased filling pressure (grade 2 diastolic dysfunction). - Ventricular septum: Septal motion showed paradox. These changes   are  consistent with a post-thoracotomy state. - Aortic valve: A bioprosthesis was present and functioning   normally. Valve area (VTI): 1.98 cm^2. Valve area (Vmax): 1.7   cm^2. Valve area (Vmean): 1.77 cm^2. - Mitral valve: Calcified annulus. Valve area by pressure   half-time: 1.56 cm^2. - Left atrium: The atrium was mildly dilated.  Cath 4/19: 1. Severe native 3 vessel CAD with known total occlusion of the RCA, severe proximal LAD stenosis, and continued patency of the LCx with mild in-stent restenosis.  2. 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 Continued patency of the LIMA-LAD and SVG-RCA grafts     Past Medical History:  Diagnosis Date  . 1st degree AV block   . ACE-inhibitor cough   . Allergic rhinitis   . Anemia    iron deficiency anemia  . Anxiety   . Aortic ectasia (HCC)    a. CT abd in 12/2016 incidentally noted aortic atherosclerosis and infrarenal abdominal aortic ectasia measuring as large as 2.7 cm with recommendation to repeat US in 2023.  Marland Kitchen Arthritis   . Asthma   . Cataract   . Chronic depression   . Chronic diastolic CHF (congestive heart failure) ([place])   . Chronic headache   . COPD (chronic obstructive pulmonary disease) ([place])   . Coronary artery disease    a. DES to RCA and mid Cx 2009. b. CABG and bioprosthetic AVR May 2015. c. cutting balloon to prox Cx in 05/2016  . Diabetes mellitus    type 2  . Diverticulitis of colon   .  Essential hypertension   . GERD (gastroesophageal reflux disease)   . Hearing loss   . History of blood transfusion 2013  . History of prosthetic aortic valve replacement   . HOH (hard of hearing)   . Hypercholesterolemia    intolerance of statins and niaspan  . Mobitz type 1 second degree AV block   . OSA (obstructive sleep apnea)    mild, intolerant of cpap  . PAD (peripheral artery disease) ([place])    a. atherosclerosis by CT abd 12/2016 in [place].   Marland Kitchen PONV (postoperative nausea and vomiting)   . Statin intolerance   . Thyroid disease    Family History  Problem Relation Age of Onset  . Breast cancer Mother 39  . Hypertension Father   . Mesothelioma Father   . Asthma Father   . Stroke Paternal Grandfather   . Heart disease Unknown   . Breast cancer Maternal Aunt   . Breast cancer Paternal Aunt    Past Surgical History:  Procedure Laterality Date  . ABDOMINAL HYSTERECTOMY    . ABDOMINAL HYSTERECTOMY W/ PARTIAL VAGINACTOMY    . AORTIC VALVE REPLACEMENT N/A 10/12/2013   Procedure: AORTIC VALVE REPLACEMENT (AVR);  Surgeon: [name], MD;  Location: [place];  Service: Open Heart Surgery;  Laterality: N/A;  . [place]  . BARTHOLIN GLAND CYST EXCISION    . BLADDER SUSPENSION    . BREAST BIOPSY Bilateral 09/11/2000   neg  . BREAST BIOPSY Left 07/24/2010   neg  . BREAST CYST EXCISION  1988   bilateral nonmalignant tumors, x3  . CARDIAC CATHETERIZATION    . CARDIAC CATHETERIZATION N/A 05/25/2016   Procedure: Coronary Balloon Angioplasty;  Surgeon: Leonie Man, MD;  Location: [place] [facility];  Service: Cardiovascular;  Laterality: N/A;  . CARDIAC CATHETERIZATION N/A 05/25/2016   Procedure: Coronary/Graft Angiography;  Surgeon: Leonie Man, MD;  Location: [place] [facility];  Service: Cardiovascular;  Laterality: N/A;  . CATARACT EXTRACTION W/ INTRAOCULAR LENS  IMPLANT, BILATERAL    . CHOLECYSTECTOMY  2001  . COLECTOMY     lap sigmoid  . COLONOSCOPY  2014   polyps found, 2 clamped off.  . CORONARY ANGIOPLASTY  10/29/2007   Prox RCA & Mid Cx.  . CORONARY ARTERY BYPASS GRAFT N/A 10/12/2013   Procedure: CORONARY ARTERY BYPASS GRAFT TIMES TWO;  Surgeon: [name], MD;  Location: [place];  Service: Open Heart Surgery;  Laterality: N/A;  . CORONARY/GRAFT ANGIOGRAPHY N/A 09/20/2017   Procedure: CORONARY/GRAFT ANGIOGRAPHY;  Surgeon: Sherren Mocha, MD;  Location: [place] [facility];  Service: Cardiovascular;   Laterality: N/A;  . LEFT HEART CATHETERIZATION WITH CORONARY ANGIOGRAM N/A 10/09/2013   Procedure: LEFT HEART CATHETERIZATION WITH CORONARY ANGIOGRAM;  Surgeon: [name], MD;  Location: [facility] CATH LAB;  Service: Cardiovascular;  Laterality: N/A;  . STERNAL WIRES REMOVAL N/A 04/13/2014   Procedure: STERNAL WIRES REMOVAL;  Surgeon: [name], MD;  Location: [facility];  Service: Thoracic;  Laterality: N/A;  . THYROIDECTOMY    . TONSILLECTOMY    . TUBAL LIGATION    . VAGINAL DELIVERY     3  . VISCERAL ARTERY INTERVENTION N/A 08/16/2016   Procedure: Visceral Artery Intervention;  Surgeon: Algernon Huxley, MD;  Location: [place] [facility];  Service: Cardiovascular;  Laterality: N/A;   Patient Active Problem List   Diagnosis Date Noted  . COPD exacerbation ([place]) 12/28/2017  . Depression, major, single episode, complete remission ([place]) 04/11/2017  .  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 Aortic ectasia, abdominal ([place]) 01/01/2017  . GAD (generalized anxiety disorder) 09/26/2016  . Acute posthemorrhagic anemia 08/24/2016  . Acute respiratory failure with hypoxia ([place]) 08/24/2016  . Acute diastolic CHF (congestive heart failure) ([place]) 08/24/2016  . Hyponatremia 08/24/2016  . Leukocytosis 08/24/2016  . Fever 08/24/2016  . Diarrhea 08/24/2016  . Generalized weakness 08/24/2016  . Acute diverticulitis 08/16/2016  . Lower GI bleed   . Ataxia 08/09/2016  . Dizziness 08/09/2016  . Staggering gait 08/09/2016  . NSTEMI (non-ST elevated myocardial infarction) ([place])   . AVB (atrioventricular block)   . Atypical chest pain   . Unstable angina ([place]) 05/25/2016  . Palpitations 05/25/2016  . PUD - recent Rx for H.Pylori 05/25/2016  . Stenosis of coronary artery stent   . Recurrent major depressive disorder, in partial remission ([place]) 07/29/2015  . Parotiditis 04/25/2015  . Viral URI 03/24/2015  . Hypoglycemia 08/25/2014  . Nausea without vomiting 08/25/2014  . Dyslipidemia-statin  ontol   . Long-term insulin use ([place]) 03/31/2014  . Aortic valve disorder 11/11/2013  . 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 Coronary Artery Disease s/p CABG 10/2013 10/10/2013  . Other malaise and fatigue 09/11/2013  . Alopecia 03/06/2013  . Insomnia 02/06/2013  . Gastroesophageal reflux disease without esophagitis 01/19/2013  . Dysphagia, unspecified(787.20) 01/19/2013  . Dyspnea 11/17/2012  . Weakness 11/17/2012  . Abnormal MRI of head 09/05/2012  . Depression with anxiety 03/21/2012  . Extrinsic asthma 11/12/2011  . Anemia 06/07/2011  . Chronic obstructive pulmonary disease ([place]) 01/25/2010  . Acquired hypothyroidism 08/10/2009  . MICROSCOPIC HEMATURIA 05/07/2009  . GOITER 03/02/2009  . Essential hypertension 01/06/2009  . MEMORY LOSS 06/23/2008  . Obstructive sleep apnea 12/25/2007  . Insulin dependent diabetes mellitus ([place]) 09/09/2006  . ALLERGIC RHINITIS 09/09/2006  . DIVERTICULOSIS, COLON 09/09/2006      Prior to Admission medications   Medication Sig Start Date End Date Taking? Authorizing Provider  albuterol (PROVENTIL) (2.5 MG/3ML) 0.083% nebulizer solution Take 3 mLs (2.5 mg total) by nebulization every 6 (six) hours as needed for wheezing or shortness of breath. 09/23/17  Yes Laverle Hobby, MD  amLODipine (NORVASC) 2.5 MG tablet Take 2.5 mg by mouth daily.   Yes [provider]  aspirin EC 81 MG tablet Take 1 tablet (81 mg total) by mouth daily. 01/28/17  Yes Dunn, Dayna N, PA-C  Calcium-Vitamin D (CALTRATE 600 PLUS-VIT D PO) Take 2 tablets by mouth 2 (two) times daily.    Yes [provider]  FLUoxetine (PROZAC) 40 MG capsule Take 40 mg by mouth daily.    Yes [provider]  furosemide (LASIX) 20 MG tablet Take 20 mg by mouth daily.   Yes [provider]  insulin NPH-regular Human (NOVOLIN 70/30) (70-30) 100 UNIT/ML injection Inject 75u under the skin every morning and 95u under the skin every evening   Yes [provider]  metFORMIN (GLUCOPHAGE-XR) 500 MG 24 hr tablet Take 500 mg by mouth every evening.   Yes [provider]  nitroGLYCERIN (NITROSTAT) 0.4 MG SL tablet Place 1 tablet (0.4 mg total) under the tongue every 5 (five) minutes as needed for chest pain. 07/25/17  Yes Simmons, Brittainy M, PA-C  ondansetron (ZOFRAN) 4 MG tablet Take 1 tablet (4 mg total) by mouth every 6 (six) hours as needed for nausea. 08/24/16  Yes Theodoro Grist, MD  umeclidinium bromide (INCRUSE ELLIPTA) 62.5 MCG/INH AEPB Inhale 1 puff into the lungs daily. 10/17/17  Yes  Flora Lipps, MD  levothyroxine (SYNTHROID, LEVOTHROID) 137 MCG tablet Take 137 mcg by mouth daily.  04/03/17   [provider]  pantoprazole (PROTONIX) 20 MG tablet Take 20 mg by mouth daily. 01/23/17   [provider]    Allergies Amitriptyline; Benadryl [diphenhydramine]; Demerol [meperidine]; Gabapentin; Loratadine; Meperidine hcl; Mirtazapine; Olanzapine; Voltaren [diclofenac sodium]; Zetia [ezetimibe]; Ativan [lorazepam]; Atorvastatin; Budesonide-formoterol fumarate; Bupropion hcl; Caffeine; Codeine sulfate; Lisinopril; Metformin; Mometasone furoate; Morphine sulfate; Other; Oxycodone-acetaminophen; Pioglitazone; Propoxyphene n-acetaminophen; Rosuvastatin; Shellfish allergy; Suvorexant; Ticagrelor; Tramadol; Trazodone and nefazodone; Venlafaxine; Zolpidem tartrate; and Latex    Social History Social History   Tobacco Use  . Smoking status: Former Smoker    Packs/day: 0.50    Years: 30.00    Pack years: 15.00    Types: Cigarettes    Last attempt to quit: 10/02/2013    Years since quitting: 4.4  . Smokeless tobacco: Never Used  Substance Use Topics  . Alcohol use: No  . Drug use: No    Review of Systems Patient denies headaches, rhinorrhea, blurry vision, numbness, shortness of breath, chest pain, edema, cough, abdominal pain, nausea, vomiting, diarrhea, dysuria, fevers, rashes or hallucinations unless otherwise  stated above in HPI. ____________________________________________   PHYSICAL EXAM:  VITAL SIGNS: Vitals:   03/04/18 1900 03/04/18 1930  BP: 135/67 138/67  Pulse: 68 76  Resp: (!) 23 (!) 22  Temp:    SpO2: 96% 96%    Constitutional: Alert and oriented.  Eyes: Conjunctivae are normal.  Head: Atraumatic. Nose: No congestion/rhinnorhea. Mouth/Throat: Mucous membranes are moist.   Neck: No stridor. Painless ROM.  Cardiovascular: Normal rate, regular rhythm. Grossly normal heart sounds.  Good peripheral circulation. Respiratory: mild tachypnea,  No retractions. Lungs with coarse bibasilar breathsounds Gastrointestinal: Soft and nontender. No distention. No abdominal bruits. No CVA tenderness. Genitourinary:  Musculoskeletal: No lower extremity tenderness nor edema.  No joint effusions. Neurologic:  Normal speech and language. No gross focal neurologic deficits are appreciated. No facial droop Skin:  Skin is warm, dry and intact. No rash noted. Psychiatric: Mood and affect are normal. Speech and behavior are normal.  ____________________________________________   LABS (all labs ordered are listed, but only abnormal results are displayed)  Results for orders placed or performed during the hospital encounter of 03/04/18 (from the past 24 hour(s))  Basic metabolic panel     Status: Abnormal   Collection Time: 03/04/18  3:05 PM  Result Value Ref Range   Sodium 136 135 - 145 mmol/L   Potassium 4.2 3.5 - 5.1 mmol/L   Chloride 104 98 - 111 mmol/L   CO2 22 22 - 32 mmol/L   Glucose, Bld 192 (H) 70 - 99 mg/dL   BUN 19 8 - 23 mg/dL   Creatinine, Ser 0.81 0.44 - 1.00 mg/dL   Calcium 8.4 (L) 8.9 - 10.3 mg/dL   GFR calc non Af Amer >60 >60 mL/min   GFR calc Af Amer >60 >60 mL/min   Anion gap 10 5 - 15  CBC     Status: Abnormal   Collection Time: 03/04/18  3:05 PM  Result Value Ref Range   WBC 11.1 (H) 3.6 - 11.0 K/uL   RBC 3.99 3.80 - 5.20 MIL/uL   Hemoglobin 9.5 (L) 12.0 - 16.0  g/dL   HCT 28.7 (L) 35.0 - 47.0 %   MCV 71.9 (L) 80.0 - 100.0 fL   MCH 23.8 (L) 26.0 - 34.0 pg   MCHC 33.1 32.0 - 36.0 g/dL   RDW 19.0 (  H) 11.5 - 14.5 %   Platelets 181 150 - 440 K/uL  Troponin I     Status: None   Collection Time: 03/04/18  3:05 PM  Result Value Ref Range   Troponin I <0.03 <0.03 ng/mL  Troponin I     Status: None   Collection Time: 03/04/18  6:35 PM  Result Value Ref Range   Troponin I <0.03 <0.03 ng/mL  Fibrin derivatives D-Dimer ([facility] only)     Status: Abnormal   Collection Time: 03/04/18  6:35 PM  Result Value Ref Range   Fibrin derivatives D-dimer ([facility]) 1,031.20 (H) 0.00 - 499.00 ng/mL (FEU)   ____________________________________________  EKG My review and personal interpretation at Time: 15:01   Indication: chest pain  Rate: 89  Rhythm: sinus Axis: normal Other: normal intervals, no stemi ____________________________________________  RADIOLOGY  I personally reviewed all radiographic images ordered to evaluate for the above acute complaints and reviewed radiology reports and findings.  These findings were personally discussed with the patient.  Please see medical record for radiology report.  ____________________________________________   PROCEDURES  Procedure(s) performed:  Procedures    Critical Care performed: no ____________________________________________   INITIAL IMPRESSION / ASSESSMENT AND PLAN / ED COURSE  Pertinent labs & imaging results that were available during my care of the patient were reviewed by me and considered in my medical decision making (see chart for details).   DDX: ACS, pericarditis, esophagitis, boerhaaves, pe, dissection, pna, bronchitis, costochondritis   [name] is a 72 y.o. who presents to the ED with very complex past medical history presented to the ER as described above.  Patient afebrile not tachycardic mildly tachypneic but in no respiratory distress.  EKG shows some nonspecific changes.  Initial  enzymes negative.  Seems less consistent with unstable angina or possible worsening stable angina she does have extensive CAD.  Possible worsening of aortic valve stenosis but she has a recent echo showing normal gradient with appropriate valve area.  Bedside echo shows no evidence of pericardial effusion with good wall motion.  Chest x-ray does not show any evidence of consolidation or infiltrate.  Seems less clinically consistent with PE.  Patient has had multiple negative work-ups after abnormal d-dimer.  Will repeat d-dimer.  Anticipate if d-dimer downtrending would be less consistent with PE particularly.  The patient will be placed on continuous pulse oximetry and telemetry for monitoring.  Laboratory evaluation will be sent to evaluate for the above complaints.     Clinical Course as of Mar 04 2004  Tue Mar 04, 2018  1942 D-dimer is downtrending from previous.  Given chronically elevated d-dimer I do not believe this is clinically consistent with PE.  Discussed this with patient.  Also discussed case with Dr. and of cardiology given her complex presentation.  Certainly concern for possible right heart disease.  Given her complexity is recommended hospitalization for further medical management given worsening anginal symptoms.   [PR]  1956 I strongly recommended admission to the hospital for cardiac evaluation and possible right heart cath however after extensive discussion witnessed by nurse and husband so patient is declining admission the hospital prefer to follow-up with her cardiologist.   I talked to the patient at length and carefully explained, in layman's terms, that the standard of care is admission for cardiac monitoring, serail enzymes, blood pressure management and cardiology consultation and that by leaving against medical advice they not allowing [place] to treat their acute medical conditions according to our best medical judgement,  and that a serious adverse outcome including increased pain,  suffering, short- or long-term disability and even death, could result. I also explained to the patient that we respect their point-of-view and are not angry. I made sure that they understood that they can, and should, return at any time if they change their mind.  The patient indicated understanding of our conversation but still desired to leave against medical advice. I deemed  to be of sound mind to make this decision and demonstrate understanding of our conversation.    [PR]    Clinical Course User Index [PR] Merlyn Lot, MD     As part of my medical decision making, I reviewed the following data within the [place] notes reviewed and incorporated, Labs reviewed, notes from prior ED visits.   ____________________________________________   FINAL CLINICAL IMPRESSION(S) / ED DIAGNOSES  Final diagnoses:  Exertional chest pain  Dyspnea, unspecified type      NEW MEDICATIONS STARTED DURING THIS VISIT:  New Prescriptions   No medications on file     Note:  This document was prepared using Dragon voice recognition software and may include unintentional dictation errors.    Merlyn Lot, MD 03/04/18 2007

## 2018-03-04 NOTE — ED Notes (Addendum)
Dr. Quentin Cornwall at bedside.  States pulmonologist sent pt to ED. Told pt her lungs are not the problem today.   Denies blood thinner use. Hx heart cath.   Alert, oriented, speaking in complete sentences. Breathing fast but clear. Appears anxious.   States swelling in abd, takes fluid pill. No pitting edema noted to lower legs.

## 2018-03-04 NOTE — Addendum Note (Signed)
Addended by: Stephanie Coup on: 03/04/2018 03:51 PM   Modules accepted: Orders

## 2018-03-05 ENCOUNTER — Ambulatory Visit: Payer: Medicare HMO | Admitting: Nurse Practitioner

## 2018-03-05 ENCOUNTER — Other Ambulatory Visit: Payer: Self-pay | Admitting: Internal Medicine

## 2018-03-05 ENCOUNTER — Encounter: Payer: Self-pay | Admitting: Nurse Practitioner

## 2018-03-05 ENCOUNTER — Telehealth: Payer: Self-pay | Admitting: Cardiovascular Disease

## 2018-03-05 VITALS — BP 150/84 | HR 73 | Ht 66.0 in | Wt 199.8 lb

## 2018-03-05 DIAGNOSIS — R079 Chest pain, unspecified: Secondary | ICD-10-CM

## 2018-03-05 DIAGNOSIS — Z1231 Encounter for screening mammogram for malignant neoplasm of breast: Secondary | ICD-10-CM

## 2018-03-05 MED ORDER — RANOLAZINE ER 500 MG PO TB12: 500.0000 mg | ORAL_TABLET | Freq: Two times a day (BID) | ORAL | 6 refills | Status: DC

## 2018-03-05 NOTE — Telephone Encounter (Signed)
I spoke with pt's husband. Pt was seen in [facility] ED yesterday.  Pt did not want to be admitted.  Husband states heart catheterization was discussed.  They would prefer to have this done at [facility] if needed.  I scheduled pt to see Truitt Merle, NP today at 2:00.

## 2018-03-05 NOTE — Progress Notes (Signed)
CARDIOLOGY OFFICE NOTE  Date:  03/05/2018    [name] Date of Birth: [birth year] Medical Record #[id number]  PCP:  Glendon Axe, MD  Cardiologist:  [facility]   Chief Complaint  Patient presents with  . Chest Pain    Work in [place] [facility] - seen for Dr. Angelena Form    History of Present Illness: [name] is a 72 y.o. female who presents today for a post ER/work in visit. Seen for Dr. Angelena Form.   Her situation is quite complex.   She has a history of anemia, HTN, HLD, DM, COPD, CAD s/p prior stent placements followed by CABG and AVRinMay 2015. She underwent CABG with Dr. Cyndia Bent 10/12/13 (L-LAD, S-RCA) + AVR with a bioprosthetic valve (21 mm [employer]).   She has basically not done well since her bypass surgery - she has continued to have chest pain. Exercise capacity is severely impaired. She has had several admissions since. She has multiple med intolerances and allergies. She is statin intolerant. She is not able to take beta blocker. Unable to afford Repatha. She has not been able to be compliant with her CPAP for known OSA. She was recathed in 2017 - this showed patent LIMA to LAD and SVG to RCA but progression of stent restenosis in the proximal circumflex which was treated with cutting balloon angioplasty. She has had second degree AV block - has seen EP - did not feel that her bradycardia and heart block was related to her dizziness and suggested that her dyspnea may be related to chronotropic incompetence and suggested stress testing. Exercise stress test on 07/27/16 with 1 minute 36 seconds of exercise stopped due to dizziness. Heart rate responded normally to exercise.Her trazodone was stopped at that and her dizziness resolved. Effient was stopped in September 2018 due to GI bleeding/anemia. Seen in our office in January 2019 with c/o rapid heart rate with minimal exercise. 48 hour cardiac monitor showed sinus rhythm with bradycardia, rates as low as 52 bpm.  Rare PACs, Rare PVCs. Echo January 2019 with LVEF=60-65%. AVR working wellbut the gradients across the valve were increased from the prior study.  Last seen by Dr. Angelena Form in 09/2017 - still with chest pain - ended up getting admitted for chest pain - was recathed - found to have severe native 3 vessel CAD w/ known total occlusion of the RCA, severe proximal LAD stenosis, and continued patency of the LCx w/ mild-in-stent restenosis. Continued patency of the LIMA-LAD and SVG to RCA. There was no indication for PCI. Continued medical man agent of CAD was recommended. In regards to her aortic valve, Dr. Burt Knack was unable to cross the aortic valve in the cath lab (h/o bioprosthetic aortic valve stenosis). She left the cath lab in stable condition and 2D echo was done to assess status of AV. This showednormal functioning aortic valve. There was no stenosis and no regurgitation. Mean gradient 17 mm Hg. LVEF was normal, 55-60%. Medical management was to continue. She was to be referred to the lipid clinic.   In the ER at [facility] yesterday with several weeks of chest pain - was at the pulmonologist - had tachycardia but no hypoxia noted with ambulation in the office but considerable dyspnea/distress/anxiety noted along with chest pain and got referred to the ER - she refused admission - told to come here. Phone call from earlier today mentioned possible cath.   Comes in today. Here with her husband.  Comes in a wheelchair  but able to walk. Husband seems to answer most questions. She does not understand what is wrong - says the lung doctor says its her heart and we say its her lungs. She says she gets short of breath with just walking to the bathroom. She is no longer able to cook. They eat out most meals. They were at [employer] today. She has had less chest pain. Seems more bothered by significant DOE. Some cough. She feels like she has lots of swelling. She does not weigh daily. She will use extra diuretic.  She will occasionally use NTG. Her husband has had to take over most of the household chores. She is basically "not able to do anything". She is depressed. She tried CPAP twice - felt like she would smother. A1C is almost 9 - can't afford insulin pump. Her lipids are not being treated due to intolerance and inin  Past Medical History:  Diagnosis Date  . 1st degree AV block   . ACE-inhibitor cough   . Allergic rhinitis   . Anemia    iron deficiency anemia  . Anxiety   . Aortic ectasia (HCC)    a. CT abd in 12/2016 incidentally noted aortic atherosclerosis and infrarenal abdominal aortic ectasia measuring as large as 2.7 cm with recommendation to repeat US in 2023.  Marland Kitchen Arthritis   . Asthma   . Cataract   . Chronic depression   . Chronic diastolic CHF (congestive heart failure) ([place])   . Chronic headache   . COPD (chronic obstructive pulmonary disease) ([place])   . Coronary artery disease    a. DES to RCA and mid Cx 2009. b. CABG and bioprosthetic AVR May 2015. c. cutting balloon to prox Cx in 05/2016  . Diabetes mellitus    type 2  . Diverticulitis of colon   . Essential hypertension   . GERD (gastroesophageal reflux disease)   . Hearing loss   . History of blood transfusion 2013  . History of prosthetic aortic valve replacement   . HOH (hard of hearing)   . Hypercholesterolemia    intolerance of statins and niaspan  . Mobitz type 1 second degree AV block   . OSA (obstructive sleep apnea)    mild, intolerant of cpap  . PAD (peripheral artery disease) ([place])    a. atherosclerosis by CT abd 12/2016 in [place].  Marland Kitchen PONV (postoperative nausea and vomiting)   . Statin intolerance   . Thyroid disease     Past Surgical History:  Procedure Laterality Date  . ABDOMINAL HYSTERECTOMY    . ABDOMINAL HYSTERECTOMY W/ PARTIAL VAGINACTOMY    . AORTIC VALVE REPLACEMENT N/A 10/12/2013   Procedure: AORTIC VALVE REPLACEMENT (AVR);  Surgeon: [name], MD;  Location: [facility];  Service: Open Heart  Surgery;  Laterality: N/A;  . [place]  . BARTHOLIN GLAND CYST EXCISION    . BLADDER SUSPENSION    . BREAST BIOPSY Bilateral 09/11/2000   neg  . BREAST BIOPSY Left 07/24/2010   neg  . BREAST CYST EXCISION  1988   bilateral nonmalignant tumors, x3  . CARDIAC CATHETERIZATION    . CARDIAC CATHETERIZATION N/A 05/25/2016   Procedure: Coronary Balloon Angioplasty;  Surgeon: Leonie Man, MD;  Location: [facility];  Service: Cardiovascular;  Laterality: N/A;  . CARDIAC CATHETERIZATION N/A 05/25/2016   Procedure: Coronary/Graft Angiography;  Surgeon: Leonie Man, MD;  Location: [facility];  Service: Cardiovascular;  Laterality: N/A;  . CATARACT EXTRACTION  W/ INTRAOCULAR LENS  IMPLANT, BILATERAL    . CHOLECYSTECTOMY  2001  . COLECTOMY     lap sigmoid  . COLONOSCOPY  2014   polyps found, 2 clamped off.  . CORONARY ANGIOPLASTY  10/29/2007   Prox RCA & Mid Cx.  . CORONARY ARTERY BYPASS GRAFT N/A 10/12/2013   Procedure: CORONARY ARTERY BYPASS GRAFT TIMES TWO;  Surgeon: [name], MD;  Location: [facility];  Service: Open Heart Surgery;  Laterality: N/A;  . CORONARY/GRAFT ANGIOGRAPHY N/A 09/20/2017   Procedure: CORONARY/GRAFT ANGIOGRAPHY;  Surgeon: Sherren Mocha, MD;  Location: [facility];  Service: Cardiovascular;  Laterality: N/A;  . LEFT HEART CATHETERIZATION WITH CORONARY ANGIOGRAM N/A 10/09/2013   Procedure: LEFT HEART CATHETERIZATION WITH CORONARY ANGIOGRAM;  Surgeon: [name], MD;  Location: [facility] CATH LAB;  Service: Cardiovascular;  Laterality: N/A;  . STERNAL WIRES REMOVAL N/A 04/13/2014   Procedure: STERNAL WIRES REMOVAL;  Surgeon: [name], MD;  Location: [facility];  Service: Thoracic;  Laterality: N/A;  . THYROIDECTOMY    . TONSILLECTOMY    . TUBAL LIGATION    . VAGINAL DELIVERY     3  . VISCERAL ARTERY INTERVENTION N/A 08/16/2016   Procedure: Visceral Artery Intervention;  Surgeon: Algernon Huxley, MD;  Location: [facility] [facility];   Service: Cardiovascular;  Laterality: N/A;     Medications: Current Meds  Medication Sig  . albuterol (PROVENTIL) (2.5 MG/3ML) 0.083% nebulizer solution Take 3 mLs (2.5 mg total) by nebulization every 6 (six) hours as needed for wheezing or shortness of breath.  Marland Kitchen amLODipine (NORVASC) 2.5 MG tablet Take 2.5 mg by mouth daily.  Marland Kitchen aspirin EC 81 MG tablet Take 1 tablet (81 mg total) by mouth daily.  . Calcium-Vitamin D (CALTRATE 600 PLUS-VIT D PO) Take 2 tablets by mouth 2 (two) times daily.   Marland Kitchen FLUoxetine (PROZAC) 40 MG capsule Take 40 mg by mouth daily.   . furosemide (LASIX) 20 MG tablet Take 20 mg by mouth daily.  . insulin NPH-regular Human (NOVOLIN 70/30) (70-30) 100 UNIT/ML injection Inject 75u under the skin every morning and 95u under the skin every evening  . levothyroxine (SYNTHROID, LEVOTHROID) 125 MCG tablet Take 125 mcg by mouth daily.   . metFORMIN (GLUCOPHAGE-XR) 500 MG 24 hr tablet Take 500 mg by mouth every evening.  . nitroGLYCERIN (NITROSTAT) 0.4 MG SL tablet Place 1 tablet (0.4 mg total) under the tongue every 5 (five) minutes as needed for chest pain.  Marland Kitchen ondansetron (ZOFRAN) 4 MG tablet Take 1 tablet (4 mg total) by mouth every 6 (six) hours as needed for nausea.  . pantoprazole (PROTONIX) 20 MG tablet Take 20 mg by mouth daily.  Marland Kitchen umeclidinium bromide (INCRUSE ELLIPTA) 62.5 MCG/INH AEPB Inhale 1 puff into the lungs daily.   Current Facility-Administered Medications for the 03/05/18 encounter (Office Visit) with Burtis Junes, NP  Medication  . umeclidinium bromide (INCRUSE ELLIPTA) 62.5 MCG/INH 1 puff     Allergies: Allergies  Allergen Reactions  . Amitriptyline Other (See Comments)    Unknown reaction  . Benadryl [Diphenhydramine] Shortness Of Breath  . Demerol [Meperidine] Other (See Comments)    Unknown reaction  . Gabapentin Other (See Comments)    Unknown reaction  . Loratadine Other (See Comments)    Unknown reaction  . Meperidine Hcl Other (See  Comments)    Unknown reaction  . Mirtazapine Other (See Comments)    Unknown reaction  . Olanzapine Other (See Comments)  Unknown reaction   . Voltaren [Diclofenac Sodium] Shortness Of Breath  . Zetia [Ezetimibe] Other (See Comments)    Weakness in legs, shakiness all over  . Ativan [Lorazepam] Other (See Comments)    Causes double vision at highter than .5 mg dose  . Atorvastatin Other (See Comments)    Muscle aches and weakness  . Budesonide-Formoterol Fumarate Other (See Comments)    Shakiness, tremors  . Bupropion Hcl Other (See Comments)    "cloud over me" depression  . Caffeine Other (See Comments)    jitters  . Codeine Sulfate Other (See Comments)    Makes chest hurt like a heart attack  . Lisinopril Cough  . Metformin Nausea And Vomiting  . Mometasone Furoate Nausea And Vomiting  . Morphine Sulfate Other (See Comments)    Chest pain like a heart attack  . Other Other (See Comments)    Beta Blockers, reaction shortness of breath  . Oxycodone-Acetaminophen Nausea And Vomiting  . Pioglitazone Other (See Comments)    Cannot take because of risk of bladder cancer  . Propoxyphene N-Acetaminophen Nausea And Vomiting  . Rosuvastatin Other (See Comments)    Muscle aches and weakness  . Shellfish Allergy Diarrhea  . Suvorexant Other (See Comments)    Jerking/nervous   . Ticagrelor     Other reaction(s): Other (See Comments) "slowed heart rate" & chest pain  . Tramadol Nausea Only  . Trazodone And Nefazodone Nausea And Vomiting  . Venlafaxine Other (See Comments)    Unknown reaction  . Zolpidem Tartrate Other (See Comments)     Jittery, diarrhea  . Latex Rash    Social History: The patient  reports that she quit smoking about 4 years ago. Her smoking use included cigarettes. She has a 15.00 pack-year smoking history. She has never used smokeless tobacco. She reports that she does not drink alcohol or use drugs.   Family History: The patient's family history  includes Asthma in her father; Breast cancer in her maternal aunt and paternal aunt; Breast cancer (age of onset: 3) in her mother; Heart disease in her unknown relative; Hypertension in her father; Mesothelioma in her father; Stroke in her paternal grandfather.   Review of Systems: Please see the history of present illness.   Otherwise, the review of systems is positive for none.   All other systems are reviewed and negative.   Physical Exam: VS:  BP (!) 150/84 (BP Location: Left Arm, Patient Position: Sitting, Cuff Size: Normal)   Pulse 73   Ht 5\' 6"  (1.676 m)   Wt 199 lb 12.8 oz (90.6 kg)   BMI 32.25 kg/m  .  BMI Body mass index is 32.25 kg/m.  Wt Readings from Last 3 Encounters:  03/05/18 199 lb 12.8 oz (90.6 kg)  03/04/18 199 lb (90.3 kg)  12/28/17 196 lb 10.4 oz (89.2 kg)    General:  Looks older than her stated age. Alert and in no acute distress. In a wheelchair.  Obese.  HEENT: Normal.  Neck: Supple, no JVD, carotid bruits, or masses noted.  Cardiac: Regular rate and rhythm. No murmurs, rubs, or gallops. No edema.  Respiratory:  Lungs are clear to auscultation bilaterally with normal work of breathing.  GI: Soft and nontender.  MS: No deformity or atrophy. Gait and ROM intact.  Skin: Warm and dry. Color is normal.  Neuro:  Strength and sensation are intact and no gross focal deficits noted.  Psych: Alert, appropriate and with normal affect.   LABORATORY  DATA:  EKG:  EKG is ordered today. This demonstrates NSR - reviewed with Dr. Angelena Form.  Lab Results  Component Value Date   WBC 11.1 (H) 03/04/2018   HGB 9.5 (L) 03/04/2018   HCT 28.7 (L) 03/04/2018   PLT 181 03/04/2018   GLUCOSE 192 (H) 03/04/2018   CHOL 173 09/20/2017   TRIG 207 (H) 09/20/2017   HDL 22 (L) 09/20/2017   LDLDIRECT 127.0 07/07/2014   LDLCALC 110 (H) 09/20/2017   ALT 12 (L) 09/19/2017   AST 13 (L) 09/19/2017   NA 136 03/04/2018   K 4.2 03/04/2018   CL 104 03/04/2018   CREATININE 0.81  03/04/2018   BUN 19 03/04/2018   CO2 22 03/04/2018   TSH 0.133 (L) 07/08/2017   INR 1.10 09/19/2017   HGBA1C 8.2 (H) 09/19/2017   MICROALBUR 0.2 12/24/2008     BNP (last 3 results) Recent Labs    12/28/17 1026  BNP 259.0*    ProBNP (last 3 results) No results for input(s): PROBNP in the last 8760 hours.   Other Studies Reviewed Today:   CTA CHEST 7/19: IMPRESSION: 1. No pulmonary embolism. 2. No active pulmonary disease. 3. Mild cardiomegaly. Dilated main pulmonary artery, suggesting pulmonary arterial hypertension. 4. Small hiatal hernia.  Echo 4/19: Study Conclusions  - Procedure narrative: Transthoracic echocardiography. Image quality was adequate. The study was technically difficult. - Left ventricle: The cavity size was normal. There was mild concentric hypertrophy. Systolic function was normal. The estimated ejection fraction was in the range of 55% to 60%. Wall motion was normal; there were no regional wall motion abnormalities. Features are consistent with a pseudonormal left ventricular filling pattern, with concomitant abnormal relaxation and increased filling pressure (grade 2 diastolic dysfunction). - Ventricular septum: Septal motion showed paradox. These changes are consistent with a post-thoracotomy state. - Aortic valve: A bioprosthesis was present and functioning normally. Valve area (VTI): 1.98 cm^2. Valve area (Vmax): 1.7 cm^2. Valve area (Vmean): 1.77 cm^2. - Mitral valve: Calcified annulus. Valve area by pressure half-time: 1.56 cm^2. - Left atrium: The atrium was mildly dilated.  Cath 4/19: 1. Severe native 3 vessel CAD with known total occlusion of the RCA, severe proximal LAD stenosis, and continued patency of the LCx with mild in-stent restenosis.  2. 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 Continued patency of the LIMA-LAD and  SVG-RCA grafts   Assessment/Plan:  1. Ongoing bouts of chest pain/DOE  2. Prior CABG/past PCI's with multiple caths - last study from April of 2019 was stable.   3. Multiple medication intolerances/allergies  4. Severely impaired exercise capacity  5. COPD  6. Bradycardia with PACs/PVCs noted  7. Prior AVR - last echo stable.   8. HLD - statin intolerant and not able to afford Repatha.   9. DM - uncontrolled - per PCP  10. COPD - sees pulmonary  11. Anxiety/depression  12. OSA - not compliant on CPAP   Her situation is quite complex - she has had cardiac cath back in April - this was stable. Her PA pressures were normal on last echo. She has not had PE on recent CTA. Discussed at length with patient and Dr. Angelena Form. Her options are felt to be very limited. She is not able to be on guideline therapy for her multiple co-morbidities. She has multiple med intolerances as well as other issues that are not amenable. She is agreeable to trying  Ranexa. We think she is severely deconditioned as well - this certainly impacts her prognosis. I have spent over 30 minutes in discussion with the patient.   Current medicines are reviewed with the patient today.  The patient does not have concerns regarding medicines other than what has been noted above.  The following changes have been made:  See above.  Labs/ tests ordered today include:    Orders Placed This Encounter  Procedures  . EKG 12-Lead     Disposition:   FU with Dr. Angelena Form as planned.    Patient is agreeable to this plan and will call if any problems develop in the interim.   SignedTruitt Merle, NP  03/05/2018 3:07 PM  [facility] [address] [place], [place]  [address] Phone: [phone] Fax: [phone]

## 2018-03-05 NOTE — Telephone Encounter (Signed)
New Message   Patients spouse is calling on her behalf. He states yesterday that she went to the pulmonary care and they sent her to the ER. At the ER they wanted to admit her but they declined because they do not trust the doctors at [facility]. Please call to discuss.

## 2018-03-05 NOTE — Patient Instructions (Addendum)
We will be checking the following labs today - NONE   Medication Instructions:    Continue with your current medicines. BUT  We are going to try Ranexa 500mg  to take twice a day - I have sent a one month supply to your pharmacy to try.     Testing/Procedures To Be Arranged:  N/A  Follow-Up:   See Dr. Angelena Form as planned next month    Other Special Instructions:   N/A    If you need a refill on your cardiac medications before your next appointment, please call your pharmacy.   Call the [place] office at [phone] if you have any questions, problems or concerns.

## 2018-04-17 NOTE — Progress Notes (Signed)
Chief Complaint  Patient presents with  . Follow-up    CAD     History of Present Illness: 72 yo female with history of anemia, HTN, HLD, DM, COPD, CAD s/p prior stent placements followed by CABG and AVR May 2015 who is here today for cardiac follow up. She had a DES placed in the RCA and mid Circumflex in May 2009. She was admitted 10/2013 at [facility] with chest pain. Echo demonstrated normal LVEF, mod AI and mild AS. Myoview was negative for ischemia. She followed up with Dr. Ida Rogue in [place] with continued symptoms and was set up for [facility]. This demonstrated 3v CAD with severe prox LAD stenosis and ostial RCA. She was referred for CABG. Intraoperative TEE demonstrated mild AS and mod to severe AI. She underwent CABG with Dr. Cyndia Bent 10/12/13 (L-LAD, S-RCA) + AVR with a bioprosthetic valve (21 mm [employer]). Echo June 2015 with normal LVEF and normal AVR. She was seen in our office 12/03/13 by Richardson Dopp, PA-C and still having chest pain. Troponin was negative. D-dimer was elevated. Pt was sent to [facility] for CTA chest to exclude PE that evening. NO PE on chest CT but noted to have small left pleural effusion. Admitted to [facility] 07/26/14 with chest pain. Stress myoview February 2016 with no ischemia. She does not tolerate statins or Zetia. Seen in lipid clinic Feb 2016 and tried on low dose Pravachol but did not tolerate. She was admitted to [facility] December 2017 with unstable angina. Cardiac cath with patent LIMA to LAD and SVG to RCA but progression of stent restenosis in the proximal circumflex which was treated with cutting balloon angioplasty. She had Second degree AV block noted in the hospital. She did not tolerate Brilinta and was placed on Effient. She had continued dizziness post discharge and cardiac monitor showed sinus with 1st degree AV block, second degree AV block without diary notation of symptoms during these events. She was seen by Dr. Caryl Comes who did not feel that her bradycardia  and heart block was related to her dizziness. He suggested that her dyspnea may be related to chronotropic incompetence and suggested stress testing. Exercise stress test on 07/27/16 with 1 minute 36 seconds of exercise stopped due to dizziness. Heart rate responded normally to exercise. Her trazodone was stopped and her dizziness resolved. Effient was stopped in September 2018 due to GI bleeding/anemia. She was seen in our office in January 2019 with c/o rapid heart rate with minimal exercise. 48 hour cardiac monitor showed sinus rhythm with bradycardia, rates as low as 52 bpm. Rare PACs, Rare PVCs. Echo January 2019 with LVEF=60-65%. AVR working well but the gradients across the valve were increased from the prior study. I saw her in the office in April 2019 and she was feeling terrible with many complaints including chest pressure, dyspnea, dizziness, fevers and fatigue. She was admitted from the office. Cardiac cath on 09/20/17 with patent LIMA to LAD and patent SVG to RCA with patent Circumflex stent with mild restenosis. Echo 09/20/17 with UKGU=54-27%, grade 2 diastolic dysfunction. Her aortic valve bioprosthesis was working well. She has not tolerated CPAP. She has intolerance to statins and beta blockers. She is now on Ranexa which was started in October 2019. She is followed by pulmonology and told that her symptoms are most likely due to her heart. PA pressures normal by echo.   She is here today for follow up. The patient denies any chest pain, dyspnea, palpitations, lower extremity  edema, orthopnea, PND, dizziness, near syncope or syncope. Ranexa has helped her chest pain.   Primary Care Physician: Glendon Axe, MD  Past Medical History:  Diagnosis Date  . 1st degree AV block   . ACE-inhibitor cough   . Allergic rhinitis   . Anemia    iron deficiency anemia  . Anxiety   . Aortic ectasia (HCC)    a. CT abd in 12/2016 incidentally noted aortic atherosclerosis and infrarenal abdominal aortic  ectasia measuring as large as 2.7 cm with recommendation to repeat US in 2023.  Marland Kitchen Arthritis   . Asthma   . Cataract   . Chronic depression   . Chronic diastolic CHF (congestive heart failure) ([place])   . Chronic headache   . COPD (chronic obstructive pulmonary disease) ([place])   . Coronary artery disease    a. DES to RCA and mid Cx 2009. b. CABG and bioprosthetic AVR May 2015. c. cutting balloon to prox Cx in 05/2016  . Diabetes mellitus    type 2  . Diverticulitis of colon   . Essential hypertension   . GERD (gastroesophageal reflux disease)   . Hearing loss   . History of blood transfusion 2013  . History of prosthetic aortic valve replacement   . HOH (hard of hearing)   . Hypercholesterolemia    intolerance of statins and niaspan  . Mobitz type 1 second degree AV block   . OSA (obstructive sleep apnea)    mild, intolerant of cpap  . PAD (peripheral artery disease) ([place])    a. atherosclerosis by CT abd 12/2016 in [place].  Marland Kitchen PONV (postoperative nausea and vomiting)   . Statin intolerance   . Thyroid disease     Past Surgical History:  Procedure Laterality Date  . ABDOMINAL HYSTERECTOMY    . ABDOMINAL HYSTERECTOMY W/ PARTIAL VAGINACTOMY    . AORTIC VALVE REPLACEMENT N/A 10/12/2013   Procedure: AORTIC VALVE REPLACEMENT (AVR);  Surgeon: [name], MD;  Location: [place];  Service: Open Heart Surgery;  Laterality: N/A;  . [place]  . BARTHOLIN GLAND CYST EXCISION    . BLADDER SUSPENSION    . BREAST BIOPSY Bilateral 09/11/2000   neg  . BREAST BIOPSY Left 07/24/2010   neg  . BREAST CYST EXCISION  1988   bilateral nonmalignant tumors, x3  . CARDIAC CATHETERIZATION    . CARDIAC CATHETERIZATION N/A 05/25/2016   Procedure: Coronary Balloon Angioplasty;  Surgeon: Leonie Man, MD;  Location: [place] [facility];  Service: Cardiovascular;  Laterality: N/A;  . CARDIAC CATHETERIZATION N/A 05/25/2016   Procedure: Coronary/Graft Angiography;  Surgeon: Leonie Man, MD;   Location: [place] [facility];  Service: Cardiovascular;  Laterality: N/A;  . CATARACT EXTRACTION W/ INTRAOCULAR LENS  IMPLANT, BILATERAL    . CHOLECYSTECTOMY  2001  . COLECTOMY     lap sigmoid  . COLONOSCOPY  2014   polyps found, 2 clamped off.  . CORONARY ANGIOPLASTY  10/29/2007   Prox RCA & Mid Cx.  . CORONARY ARTERY BYPASS GRAFT N/A 10/12/2013   Procedure: CORONARY ARTERY BYPASS GRAFT TIMES TWO;  Surgeon: [name], MD;  Location: [place];  Service: Open Heart Surgery;  Laterality: N/A;  . CORONARY/GRAFT ANGIOGRAPHY N/A 09/20/2017   Procedure: CORONARY/GRAFT ANGIOGRAPHY;  Surgeon: Sherren Mocha, MD;  Location: [place] [facility];  Service: Cardiovascular;  Laterality: N/A;  . LEFT HEART CATHETERIZATION WITH CORONARY ANGIOGRAM N/A 10/09/2013   Procedure: LEFT HEART CATHETERIZATION WITH CORONARY ANGIOGRAM;  Surgeon:  [name], MD;  Location: [facility] CATH LAB;  Service: Cardiovascular;  Laterality: N/A;  . STERNAL WIRES REMOVAL N/A 04/13/2014   Procedure: STERNAL WIRES REMOVAL;  Surgeon: [name], MD;  Location: [facility];  Service: Thoracic;  Laterality: N/A;  . THYROIDECTOMY    . TONSILLECTOMY    . TUBAL LIGATION    . VAGINAL DELIVERY     3  . VISCERAL ARTERY INTERVENTION N/A 08/16/2016   Procedure: Visceral Artery Intervention;  Surgeon: Algernon Huxley, MD;  Location: [place] [facility];  Service: Cardiovascular;  Laterality: N/A;    Current Outpatient Medications  Medication Sig Dispense Refill  . albuterol (PROVENTIL) (2.5 MG/3ML) 0.083% nebulizer solution Take 3 mLs (2.5 mg total) by nebulization every 6 (six) hours as needed for wheezing or shortness of breath. 75 mL 12  . amLODipine (NORVASC) 2.5 MG tablet Take 2.5 mg by mouth daily.    Marland Kitchen aspirin EC 81 MG tablet Take 1 tablet (81 mg total) by mouth daily. 90 tablet 3  . Calcium-Vitamin D (CALTRATE 600 PLUS-VIT D PO) Take 2 tablets by mouth 2 (two) times daily.     Marland Kitchen FLUoxetine (PROZAC) 40 MG capsule Take 40 mg by mouth  daily.     . furosemide (LASIX) 20 MG tablet Take 20 mg by mouth daily.    . insulin NPH-regular Human (NOVOLIN 70/30) (70-30) 100 UNIT/ML injection Inject 75u under the skin every morning and 95u under the skin every evening    . levothyroxine (SYNTHROID, LEVOTHROID) 125 MCG tablet Take 125 mcg by mouth daily.     . metFORMIN (GLUCOPHAGE-XR) 500 MG 24 hr tablet Take 500 mg by mouth every evening.    . nitroGLYCERIN (NITROSTAT) 0.4 MG SL tablet Place 1 tablet (0.4 mg total) under the tongue every 5 (five) minutes as needed for chest pain. 25 tablet 1  . ondansetron (ZOFRAN) 4 MG tablet Take 1 tablet (4 mg total) by mouth every 6 (six) hours as needed for nausea. 20 tablet 0  . pantoprazole (PROTONIX) 20 MG tablet Take 20 mg by mouth daily.    . ranolazine (RANEXA) 500 MG 12 hr tablet Take 1 tablet (500 mg total) by mouth 2 (two) times daily. 60 tablet 6  . umeclidinium bromide (INCRUSE ELLIPTA) 62.5 MCG/INH AEPB Inhale 1 puff into the lungs daily. 1 each 12   Current Facility-Administered Medications  Medication Dose Route Frequency Provider Last Rate Last Dose  . umeclidinium bromide (INCRUSE ELLIPTA) 62.5 MCG/INH 1 puff  1 puff Inhalation Daily Flora Lipps, MD        Allergies  Allergen Reactions  . Amitriptyline Other (See Comments)    Unknown reaction  . Benadryl [Diphenhydramine] Shortness Of Breath  . Demerol [Meperidine] Other (See Comments)    Unknown reaction  . Gabapentin Other (See Comments)    Unknown reaction  . Loratadine Other (See Comments)    Unknown reaction  . Meperidine Hcl Other (See Comments)    Unknown reaction  . Mirtazapine Other (See Comments)    Unknown reaction  . Olanzapine Other (See Comments)    Unknown reaction   . Voltaren [Diclofenac Sodium] Shortness Of Breath  . Zetia [Ezetimibe] Other (See Comments)    Weakness in legs, shakiness all over  . Ativan [Lorazepam] Other (See Comments)    Causes double vision at highter than .5 mg dose  .  Atorvastatin Other (See Comments)    Muscle aches and weakness  . Budesonide-Formoterol Fumarate Other (See Comments)  Shakiness, tremors  . Bupropion Hcl Other (See Comments)    "cloud over me" depression  . Caffeine Other (See Comments)    jitters  . Codeine Sulfate Other (See Comments)    Makes chest hurt like a heart attack  . Lisinopril Cough  . Metformin Nausea And Vomiting  . Mometasone Furoate Nausea And Vomiting  . Morphine Sulfate Other (See Comments)    Chest pain like a heart attack  . Other Other (See Comments)    Beta Blockers, reaction shortness of breath  . Oxycodone-Acetaminophen Nausea And Vomiting  . Pioglitazone Other (See Comments)    Cannot take because of risk of bladder cancer  . Propoxyphene N-Acetaminophen Nausea And Vomiting  . Rosuvastatin Other (See Comments)    Muscle aches and weakness  . Shellfish Allergy Diarrhea  . Suvorexant Other (See Comments)    Jerking/nervous   . Ticagrelor     Other reaction(s): Other (See Comments) "slowed heart rate" & chest pain  . Tramadol Nausea Only  . Trazodone And Nefazodone Nausea And Vomiting  . Venlafaxine Other (See Comments)    Unknown reaction  . Zolpidem Tartrate Other (See Comments)     Jittery, diarrhea  . Latex Rash    Social History   Socioeconomic History  . Marital status: Married    Spouse name: Not on file  . Number of children: 3  . Years of education: Not on file  . Highest education level: Not on file  Occupational History  . Occupation: Retired    Fish farm manager: UNEMPLOYED    Comment: CNA  Social Needs  . Financial resource strain: Not on file  . Food insecurity:    Worry: Not on file    Inability: Not on file  . Transportation needs:    Medical: Not on file    Non-medical: Not on file  Tobacco Use  . Smoking status: Former Smoker    Packs/day: 0.50    Years: 30.00    Pack years: 15.00    Types: Cigarettes    Last attempt to quit: 10/02/2013    Years since quitting: 4.5  .  Smokeless tobacco: Never Used  Substance and Sexual Activity  . Alcohol use: No  . Drug use: No  . Sexual activity: Not Currently  Lifestyle  . Physical activity:    Days per week: Not on file    Minutes per session: Not on file  . Stress: Not on file  Relationships  . Social connections:    Talks on phone: Not on file    Gets together: Not on file    Attends religious service: Not on file    Active member of club or organization: Not on file    Attends meetings of clubs or organizations: Not on file    Relationship status: Not on file  . Intimate partner violence:    Fear of current or ex partner: Not on file    Emotionally abused: Not on file    Physically abused: Not on file    Forced sexual activity: Not on file  Other Topics Concern  . Not on file  Social History Narrative   Does not have Living Will   Desires CPR, would not want prolonged life support if futile.    Family History  Problem Relation Age of Onset  . Breast cancer Mother 14  . Hypertension Father   . Mesothelioma Father   . Asthma Father   . Stroke Paternal Grandfather   . Heart disease  Unknown   . Breast cancer Maternal Aunt   . Breast cancer Paternal Aunt     Review of Systems:  As stated in the HPI and otherwise negative.   BP (!) 144/78   Pulse 82   Ht 5\' 6"  (1.676 m)   Wt 194 lb 12.8 oz (88.4 kg)   SpO2 98%   BMI 31.44 kg/m   Physical Examination:  General: Well developed, well nourished, NAD  HEENT: OP clear, mucus membranes moist  SKIN: warm, dry. No rashes. Neuro: No focal deficits  Musculoskeletal: Muscle strength 5/5 all ext  Psychiatric: Mood and affect normal  Neck: No JVD, no carotid bruits, no thyromegaly, no lymphadenopathy.  Lungs:Clear bilaterally, no wheezes, rhonci, crackles Cardiovascular: Regular rate and rhythm. Systolic murmur.  Abdomen:Soft. Bowel sounds present. Non-tender.  Extremities: No lower extremity edema. Pulses are 2 + in the bilateral DP/PT.  Echo  April 2019: - Procedure narrative: Transthoracic echocardiography. Image   quality was adequate. The study was technically difficult. - Left ventricle: The cavity size was normal. There was mild   concentric hypertrophy. Systolic function was normal. The   estimated ejection fraction was in the range of 55% to 60%. Wall   motion was normal; there were no regional wall motion   abnormalities. Features are consistent with a pseudonormal left   ventricular filling pattern, with concomitant abnormal relaxation   and increased filling pressure (grade 2 diastolic dysfunction). - Ventricular septum: Septal motion showed paradox. These changes   are consistent with a post-thoracotomy state. - Aortic valve: A bioprosthesis was present and functioning   normally. Valve area (VTI): 1.98 cm^2. Valve area (Vmax): 1.7   cm^2. Valve area (Vmean): 1.77 cm^2. - Mitral valve: Calcified annulus. Valve area by pressure   half-time: 1.56 cm^2. - Left atrium: The atrium was mildly dilated.   EKG:  EKG is ordered today. The ekg ordered today demonstrates sinus rhythm with PACs, rate 82 bpm.   Recent Labs: 07/08/2017: TSH 0.133 09/19/2017: ALT 12 12/28/2017: B Natriuretic Peptide 259.0 03/04/2018: BUN 19; Creatinine, Ser 0.81; Hemoglobin 9.5; Platelets 181; Potassium 4.2; Sodium 136   Lipid Panel    Component Value Date/Time   CHOL 173 09/20/2017 0338   CHOL 211 (H) 10/03/2013 0515   TRIG 207 (H) 09/20/2017 0338   TRIG 272 (H) 10/03/2013 0515   HDL 22 (L) 09/20/2017 0338   HDL 22 (L) 10/03/2013 0515   CHOLHDL 7.9 09/20/2017 0338   VLDL 41 (H) 09/20/2017 0338   VLDL 54 (H) 10/03/2013 0515   LDLCALC 110 (H) 09/20/2017 0338   LDLCALC 135 (H) 10/03/2013 0515   LDLDIRECT 127.0 07/07/2014 0820     Wt Readings from Last 3 Encounters:  04/18/18 194 lb 12.8 oz (88.4 kg)  03/05/18 199 lb 12.8 oz (90.6 kg)  03/04/18 199 lb (90.3 kg)     Other studies Reviewed: Additional studies/ records that were  reviewed today include: . Review of the above records demonstrates:    Assessment and Plan:   1. CAD s/p CABG with chronic angina: Cardiac cath April 2019 with stable CAD. She does not tolerate beta blockers or statins. Chest pain improved on Ranexa. Continue ASA and Ranexa.   2. Aortic valve disease: She is s/p AVR. Stable gradient across the bioprosthetic valve by echo April 2019.    3. Hypertension: BP is controlled at home.   4. Hyperlipidemia: She is statin intolerant (Crestor, Lipitor, Pravastatin). She did not tolerate Zetia.  She was seen in lipid  clinic 07/13/14 and tried on low dose Pravastatin but did not tolerate due to muscle aches. We discussed Praluent or Repatha in the past but she cancelled her appt in the lipid clinic. Will try to arrange this again.   Current medicines are reviewed at length with the patient today.  The patient does not have concerns regarding medicines.  The following changes have been made:  no change  Labs/ tests ordered today include:   Orders Placed This Encounter  Procedures  . AMB Referral to [facility]    Disposition:   FU with me in 6 months  Signed, [name], MD 04/18/2018 12:31 PM    [address], [place], [place]  [address] Phone: [phone]; Fax: [phone]

## 2018-04-18 ENCOUNTER — Encounter: Payer: Self-pay | Admitting: Cardiovascular Disease

## 2018-04-18 ENCOUNTER — Ambulatory Visit: Payer: Medicare HMO | Admitting: Cardiovascular Disease

## 2018-04-18 VITALS — BP 144/78 | HR 82 | Ht 66.0 in | Wt 194.8 lb

## 2018-04-18 DIAGNOSIS — Z952 Presence of prosthetic heart valve: Secondary | ICD-10-CM | POA: Diagnosis not present

## 2018-04-18 DIAGNOSIS — I25118 Atherosclerotic heart disease of native coronary artery with other forms of angina pectoris: Secondary | ICD-10-CM

## 2018-04-18 DIAGNOSIS — I10 Essential (primary) hypertension: Secondary | ICD-10-CM

## 2018-04-18 DIAGNOSIS — E782 Mixed hyperlipidemia: Secondary | ICD-10-CM | POA: Diagnosis not present

## 2018-04-18 NOTE — Patient Instructions (Signed)
Medication Instructions:  Your physician recommends that you continue on your current medications as directed. Please refer to the Current Medication list given to you today.  If you need a refill on your cardiac medications before your next appointment, please call your pharmacy.   Lab work: none If you have labs (blood work) drawn today and your tests are completely normal, you will receive your results only by: Marland Kitchen MyChart Message (if you have MyChart) OR . A paper copy in the mail If you have any lab test that is abnormal or we need to change your treatment, we will call you to review the results.  Testing/Procedures: none  Follow-Up:  You have been referred to the lipid clinic in [facility] office.  Please schedule appointment for patient.   At [facility], you and your health needs are our priority.  As part of our continuing mission to provide you with exceptional heart care, we have created designated Provider Care Teams.  These Care Teams include your primary Cardiologist (physician) and Advanced Practice Providers (APPs -  Physician Assistants and Nurse Practitioners) who all work together to provide you with the care you need, when you need it. You will need a follow up appointment in 6 months.  Please call our office 2 months in advance to schedule this appointment.  You may see [name], MD or one of the following Advanced Practice Providers on your designated Care Team:   [place], PA-C Melina Copa, PA-C . Ermalinda Barrios, PA-C  Any Other Special Instructions Will Be Listed Below (If Applicable).

## 2018-05-09 ENCOUNTER — Ambulatory Visit: Payer: Medicare HMO

## 2018-06-30 IMAGING — CT CT CHEST W/O CM
2 of 4 series · 15 of 36 positions shown, 18 images · non-contrast
Comparison: 07/19/2015

CLINICAL DATA: Followup indeterminate pulmonary nodule on previous
CT. Former smoker. Progressive shortness of breath.

EXAM:
CT CHEST WITHOUT CONTRAST
TECHNIQUE: Multidetector CT imaging of the chest was performed following the
standard protocol without IV contrast.

[Series 3: chest · axial · 0.67mm/px · z∈[-1217,-951]mm · 12 of 159 slices shown, 15 images (1 of 2)]
[im 13/159  mediastinal]
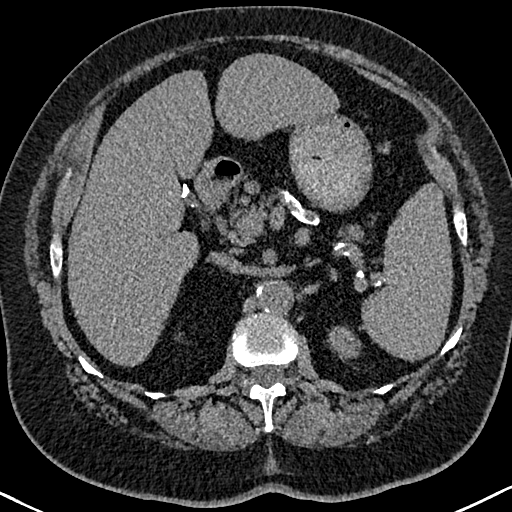
[im 13/159  lung]
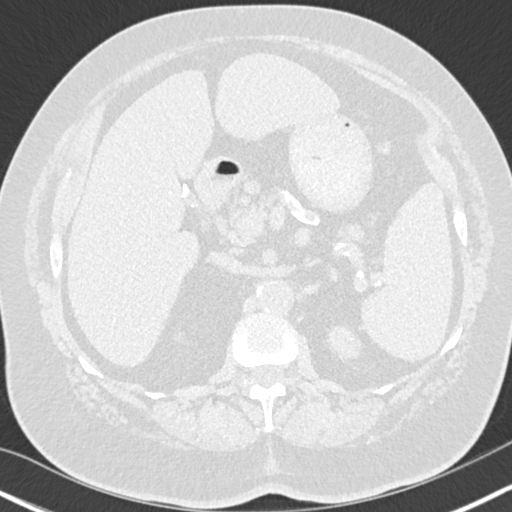
[im 25/159  lung]
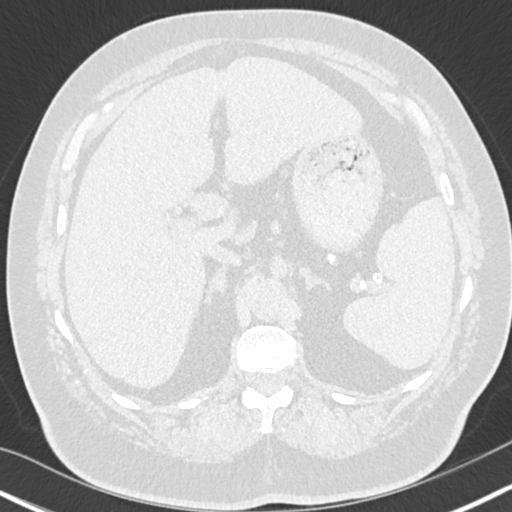
[im 37/159  lung]
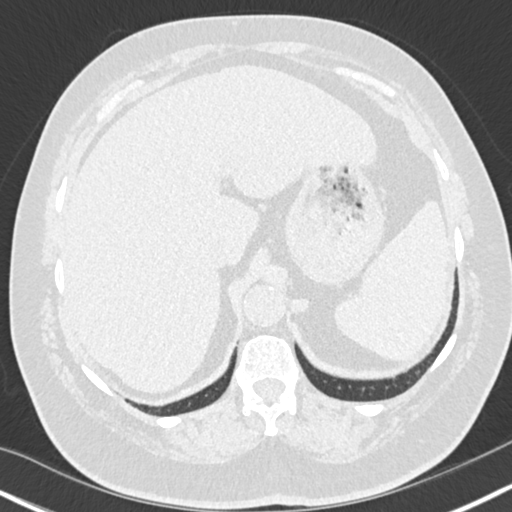
[im 49/159  lung]
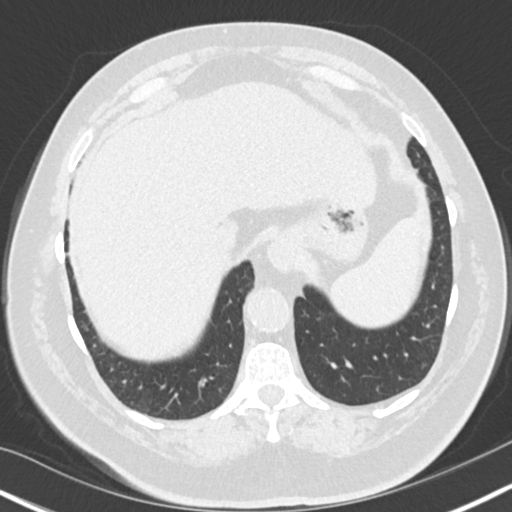
[im 61/159  mediastinal]
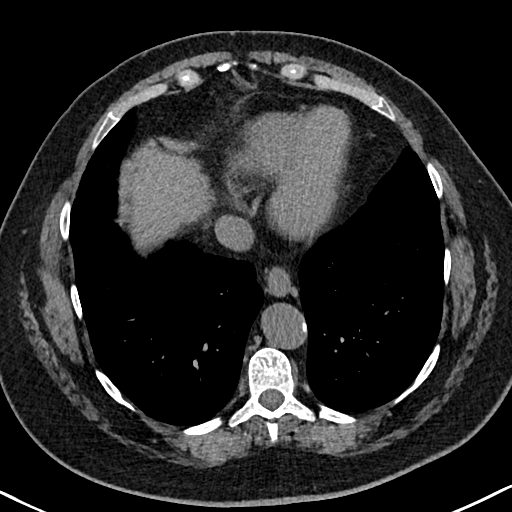
[im 61/159  lung]
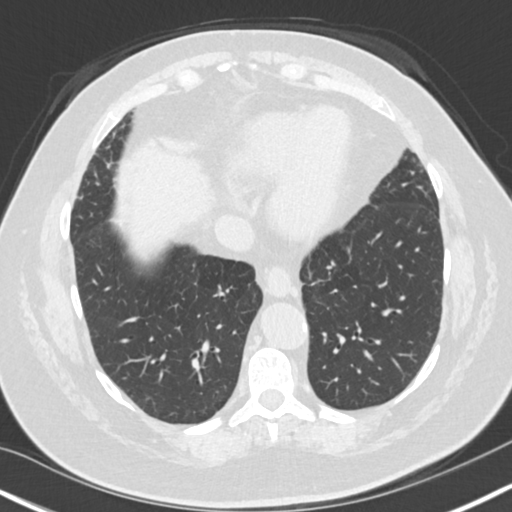
[im 73/159  lung]
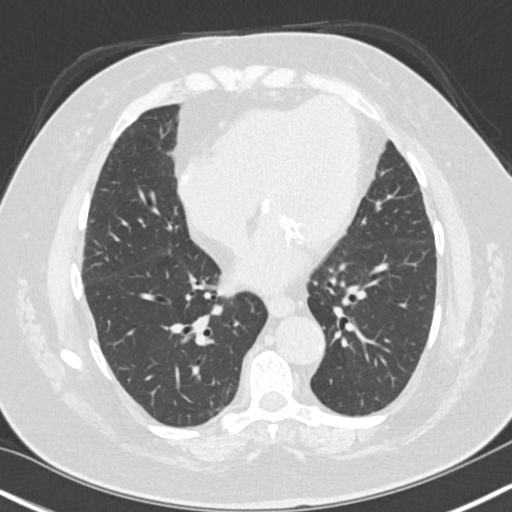
[im 86/159  lung]
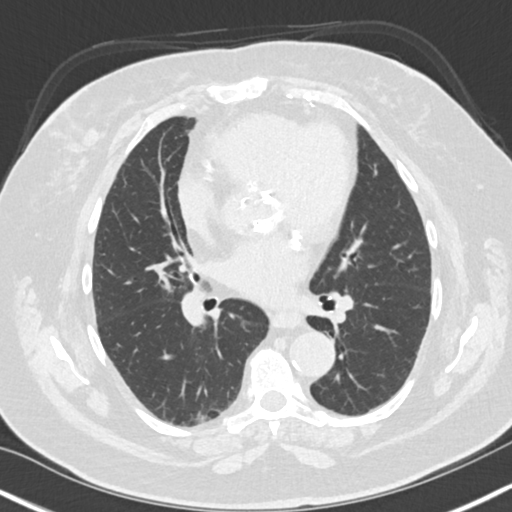
[im 98/159  lung]
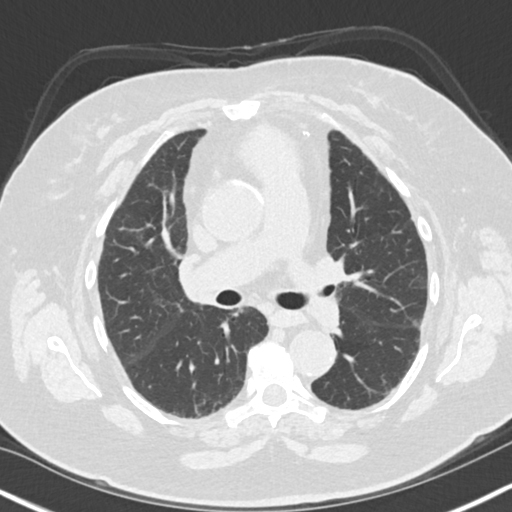
[im 110/159  mediastinal]
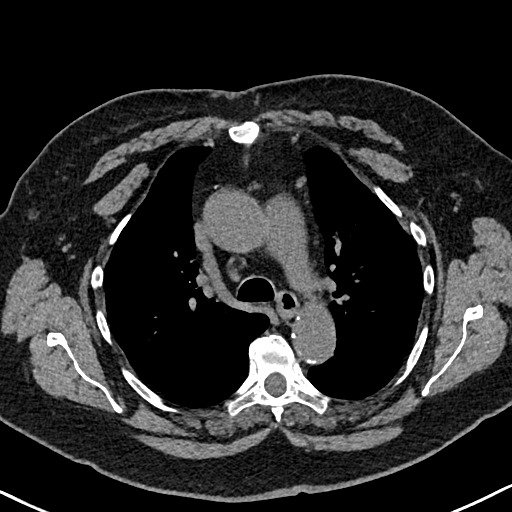
[im 110/159  lung]
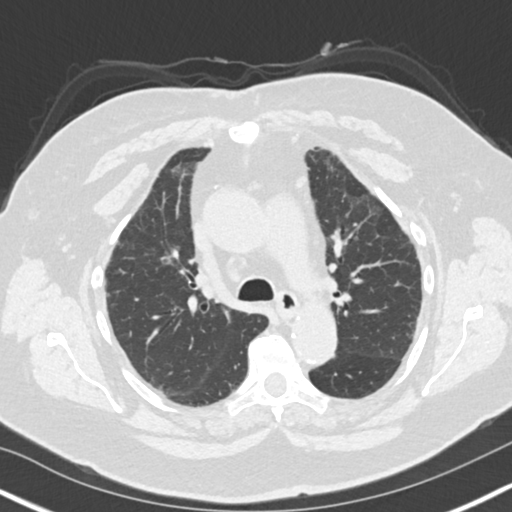
[im 122/159  lung]
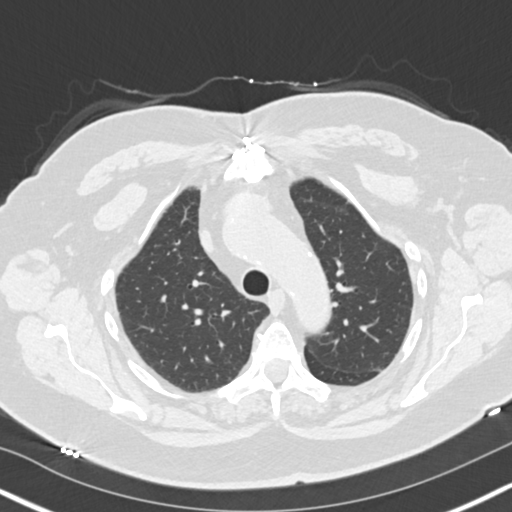
[im 134/159  lung]
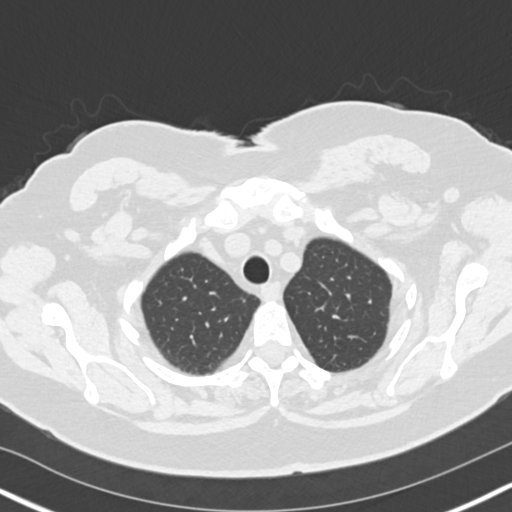
[im 146/159  lung]
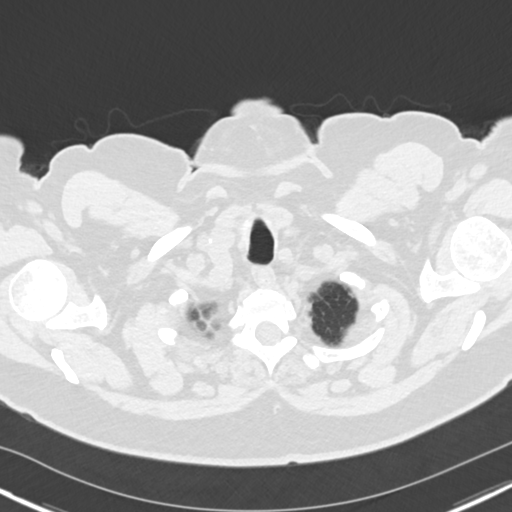

[Series 6: chest · coronal · 0.62mm/px · 3 of 173 slices shown (2 of 2)]
[im 35/173  lung]
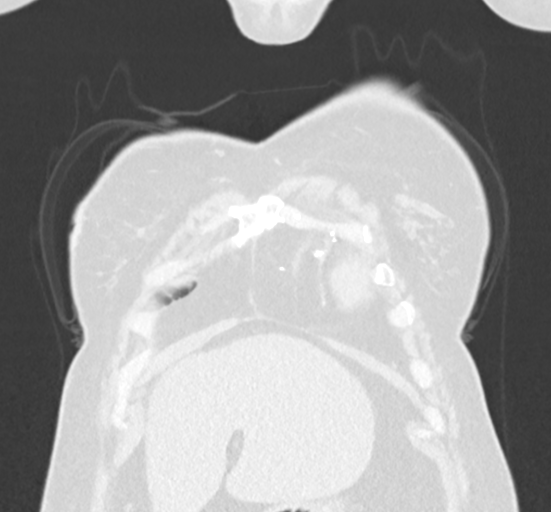
[im 69/173  lung]
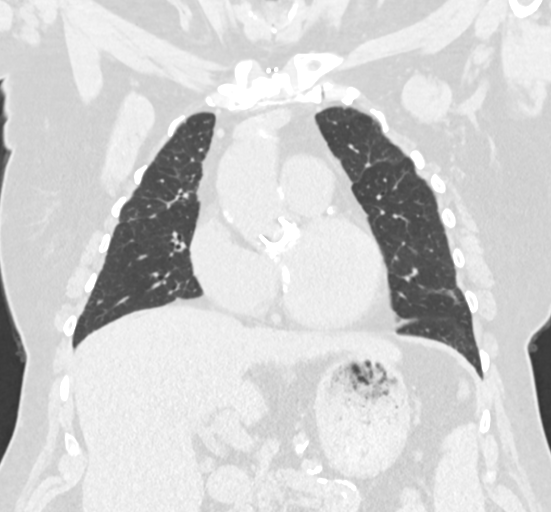
[im 104/173  lung]
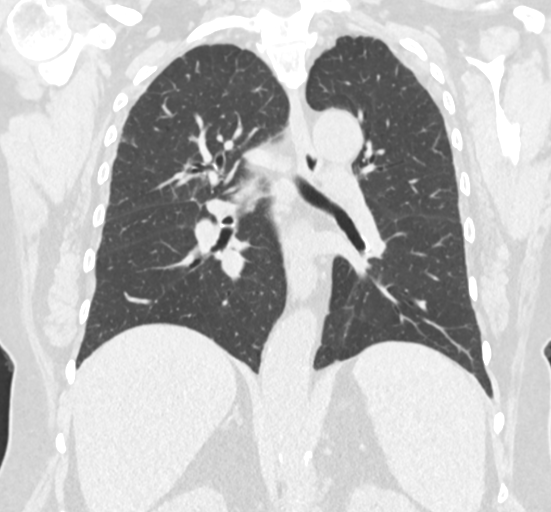

[15 of 36 positions shown; findings below may reference images not displayed]

FINDINGS: Cardiovascular: 4.2 cm ascending thoracic aortic aneurysm, without
significant change. Aortic and coronary artery atherosclerosis.
Prior CABG and aortic valve replacement.

Mediastinum/Nodes: No masses or pathologically enlarged lymph nodes
identified on this unenhanced exam.

Lungs/Pleura: 2 mm perifissural nodule in the left midlung is stable
and consistent with benign intrapulmonary lymph node. Stable 2 mm
peripheral pulmonary nodule in left lower lobe is calcified,
consistent with benign granuloma. No new or enlarging pulmonary
nodules or masses are identified. Mild bilateral upper lobe
scarring. No evidence pulmonary infiltrate or pleural effusion.

3

Upper Abdomen:  Unremarkable.

Musculoskeletal: No suspicious bone lesions. Stable 12 mm soft
tissue nodule in central right breast also likely benign in
etiology.
IMPRESSION: Stable tiny left lung nodule, consistent with benign etiology.

Stable small right breast soft tissue nodule. Continued correlation
with mammographic screening noted.

Aortic Atherosclerosis ([id number]). Coronary artery calcification.

## 2018-07-28 ENCOUNTER — Telehealth: Payer: Self-pay | Admitting: Cardiovascular Disease

## 2018-07-28 NOTE — Telephone Encounter (Signed)
No interactions with her cardiac medications. Main cardiac side effects occurring with 1-10% incidence are palpitations and edema - would advise pt to monitor for these symptoms.

## 2018-07-28 NOTE — Telephone Encounter (Signed)
Pt's husband notified.

## 2018-07-28 NOTE — Telephone Encounter (Signed)
° ° °  Pt c/o medication issue:  1. Name of Medication: evoxac  2. How are you currently taking this medication (dosage and times per day)? n/a  3. Are you having a reaction (difficulty breathing--STAT)? no  4. What is your medication issue? Patient was prescribed this medication for dry mouth by the [facility] Patient would like to know if safe to take with heart medications

## 2018-08-13 ENCOUNTER — Other Ambulatory Visit: Payer: Self-pay

## 2018-08-13 ENCOUNTER — Encounter: Payer: Self-pay | Admitting: Internal Medicine

## 2018-08-13 ENCOUNTER — Ambulatory Visit: Payer: Medicare HMO | Admitting: Internal Medicine

## 2018-08-13 VITALS — BP 140/82 | HR 90 | Temp 98.3°F | Ht 66.0 in | Wt 200.0 lb

## 2018-08-13 DIAGNOSIS — J441 Chronic obstructive pulmonary disease with (acute) exacerbation: Secondary | ICD-10-CM

## 2018-08-13 MED ORDER — PREDNISONE 20 MG PO TABS: 20.0000 mg | ORAL_TABLET | Freq: Every day | ORAL | 0 refills | Status: DC

## 2018-08-13 MED ORDER — AZITHROMYCIN 250 MG PO TABS: ORAL_TABLET | ORAL | 0 refills | Status: DC

## 2018-08-13 MED ORDER — ALBUTEROL SULFATE (2.5 MG/3ML) 0.083% IN NEBU: 2.5000 mg | INHALATION_SOLUTION | Freq: Four times a day (QID) | RESPIRATORY_TRACT | 12 refills | Status: DC | PRN

## 2018-08-13 NOTE — Progress Notes (Signed)
[facility] Consultation      Date: 08/13/2018,   MRN# [id number] [name] Sep 30, 1945      Admission                  Current  [name] is a 73 y.o. old female seen in consultation for Severe COPD     CHIEF COMPLAINT:   +Acute SOB +wheezing   HISTORY OF PRESENT ILLNESS  Long standing smoking history 30 pack year Acute OV today for increased WOB and wheezing Will treat for COPD exacerbation for now   She also has chest pain/chest tightness Been going on for days  PATIENT REFUSES TO GO TO ER FOR FURTHER EVALUATION for CHEST PAIN        Current Medication:  Current Outpatient Medications:  .  albuterol (PROVENTIL) (2.5 MG/3ML) 0.083% nebulizer solution, Take 3 mLs (2.5 mg total) by nebulization every 6 (six) hours as needed for wheezing or shortness of breath., Disp: 75 mL, Rfl: 12 .  amLODipine (NORVASC) 2.5 MG tablet, Take 2.5 mg by mouth daily., Disp: , Rfl:  .  aspirin EC 81 MG tablet, Take 1 tablet (81 mg total) by mouth daily., Disp: 90 tablet, Rfl: 3 .  Calcium-Vitamin D (CALTRATE 600 PLUS-VIT D PO), Take 2 tablets by mouth 2 (two) times daily. , Disp: , Rfl:  .  FLUoxetine (PROZAC) 40 MG capsule, Take 40 mg by mouth daily. , Disp: , Rfl:  .  furosemide (LASIX) 20 MG tablet, Take 20 mg by mouth daily., Disp: , Rfl:  .  insulin NPH-regular Human (NOVOLIN 70/30) (70-30) 100 UNIT/ML injection, Inject 75u under the skin every morning and 95u under the skin every evening, Disp: , Rfl:  .  levothyroxine (SYNTHROID, LEVOTHROID) 125 MCG tablet, Take 125 mcg by mouth daily. , Disp: , Rfl:  .  metFORMIN (GLUCOPHAGE-XR) 500 MG 24 hr tablet, Take 500 mg by mouth every evening., Disp: , Rfl:  .  nitroGLYCERIN (NITROSTAT) 0.4 MG SL tablet, Place 1 tablet (0.4 mg total) under the tongue every 5 (five) minutes as needed for chest pain., Disp: 25 tablet, Rfl: 1 .  ondansetron (ZOFRAN) 4 MG tablet, Take 1 tablet (4 mg total) by mouth every 6 (six)  hours as needed for nausea., Disp: 20 tablet, Rfl: 0 .  pantoprazole (PROTONIX) 20 MG tablet, Take 20 mg by mouth daily., Disp: , Rfl:  .  ranolazine (RANEXA) 500 MG 12 hr tablet, Take 1 tablet (500 mg total) by mouth 2 (two) times daily., Disp: 60 tablet, Rfl: 6 .  umeclidinium bromide (INCRUSE ELLIPTA) 62.5 MCG/INH AEPB, Inhale 1 puff into the lungs daily., Disp: 1 each, Rfl: 12  Current Facility-Administered Medications:  .  umeclidinium bromide (INCRUSE ELLIPTA) 62.5 MCG/INH 1 puff, 1 puff, Inhalation, Daily, Jeannette Maddy, MD    ALLERGIES   Amitriptyline; Benadryl [diphenhydramine]; Demerol [meperidine]; Gabapentin; Loratadine; Meperidine hcl; Mirtazapine; Olanzapine; Voltaren [diclofenac sodium]; Zetia [ezetimibe]; Ativan [lorazepam]; Atorvastatin; Budesonide-formoterol fumarate; Bupropion hcl; Caffeine; Codeine sulfate; Lisinopril; Metformin; Mometasone furoate; Morphine sulfate; Other; Oxycodone-acetaminophen; Pioglitazone; Propoxyphene n-acetaminophen; Rosuvastatin; Shellfish allergy; Suvorexant; Ticagrelor; Tramadol; Trazodone and nefazodone; Venlafaxine; Zolpidem tartrate; and Latex     REVIEW OF SYSTEMS   Review of Systems  Constitutional: Negative for chills, fever, malaise/fatigue and weight loss.  HENT: Negative for congestion and sore throat.   Respiratory: Positive for shortness of breath and wheezing. Negative for cough, hemoptysis and sputum production.   Cardiovascular: Negative for leg swelling.  Musculoskeletal: Negative.  Skin: Negative for rash.  Neurological: Negative for headaches.  Psychiatric/Behavioral: The patient is nervous/anxious.   All other systems reviewed and are negative.    BP 140/82 (BP Location: Left Arm, Cuff Size: Normal)   Pulse 90   Temp 98.3 F (36.8 C)   Ht 5\' 6"  (1.676 m)   Wt 200 lb (90.7 kg) Comment: patient refused to weigh, stated she weighed about 200lb  SpO2 98%   BMI 32.28 kg/m     PHYSICAL EXAM  Physical Exam   Constitutional: She is oriented to person, place, and time. She appears well-developed and well-nourished. No distress.  HENT:  Mouth/Throat: No oropharyngeal exudate.  Eyes: Pupils are equal, round, and reactive to light. EOM are normal.  Cardiovascular: Normal rate, regular rhythm and normal heart sounds.  No murmur heard. Pulmonary/Chest: No stridor. No respiratory distress. She has no wheezes. She has no rales.  Musculoskeletal: Normal range of motion.        General: No edema.  Neurological: She is alert and oriented to person, place, and time.  Skin: Skin is warm. She is not diaphoretic.  Psychiatric: She has a normal mood and affect.        CXR on 2/14 images reveiwed 5.16.19  ASSESSMENT/PLAN   COPD exacerbation Prednisone 40 mg daily for 7 days z pak ALB as needed  SOB and DOE From COPD, deconditioned state, obesity   END STAGE COPD GOLD STAGE D Allergy to BREO and Symbicort Continue Incruse   Lung nodule 3 mm in 2017 in left lower lobe    Obesity -recommend significant weight loss -recommend changing diet  Deconditioned state -Recommend increased daily activity and exercise  CHEST PAIN PATIENT REFUSES TO GO TO ER AS INSTRUCTED    Corrin Parker, M.D.  [facility]  Medical Director [place] Director [facility] Cardio-Pulmonary Department

## 2018-08-13 NOTE — Patient Instructions (Addendum)
PREDNISONE 20 mg daily for 10 days  Z pak  ADVISED TO GO TO ER  ADVISED TO SEE CARDIOLOGIST

## 2018-08-14 ENCOUNTER — Other Ambulatory Visit: Payer: Self-pay | Admitting: Internal Medicine

## 2018-08-14 DIAGNOSIS — J449 Chronic obstructive pulmonary disease, unspecified: Secondary | ICD-10-CM

## 2018-08-14 NOTE — Telephone Encounter (Signed)
Albuterol rescue inhaler requested by patient's pharmacy. Request denied according to med list medication was d/c in 2018. Pt is now on Albuterol nebs. Left message that she needs to call office if she needs this medication. Pt was seen for acute visit by Dr. Mortimer Fries on yesterday.

## 2018-08-26 ENCOUNTER — Telehealth: Payer: Self-pay | Admitting: Internal Medicine

## 2018-08-26 NOTE — Telephone Encounter (Signed)
Called and spoke pt.  Pt reports of increased sob with mild exertion, non prod cough & wheezing mainly at night. Pt is using albuterol neb 2-3 times daily with no improvement.  Denied fever, chills or sweats.  Upon pt's research she has read that Sjogren causes sob. Pt is questioning if anything else can be prescribed for Sjogren to help with her symptoms.    Dr. Mortimer Fries please advise. Thanks

## 2018-08-27 MED ORDER — PREDNISONE 20 MG PO TABS: 20.0000 mg | ORAL_TABLET | Freq: Every day | ORAL | 0 refills | Status: DC

## 2018-08-27 NOTE — Telephone Encounter (Signed)
Pt is aware of below recommendations and voiced her understanding.  Rx for Prednisone 20mg  has been sent to preferred pharmacy. Pt states she has taken prednisone previously without any reactions.  Nothing further is needed.

## 2018-08-27 NOTE — Telephone Encounter (Signed)
With regards to Sjogren's disease-she will need to call Rheumatologist For her SOB-prednisone 20 mg daily for 10 days Also recommend follow up with GI

## 2018-09-16 ENCOUNTER — Encounter: Payer: Self-pay | Admitting: Internal Medicine

## 2018-09-16 ENCOUNTER — Telehealth: Payer: Self-pay | Admitting: Internal Medicine

## 2018-09-16 ENCOUNTER — Ambulatory Visit (INDEPENDENT_AMBULATORY_CARE_PROVIDER_SITE_OTHER): Payer: Medicare HMO | Admitting: Internal Medicine

## 2018-09-16 DIAGNOSIS — J449 Chronic obstructive pulmonary disease, unspecified: Secondary | ICD-10-CM | POA: Diagnosis not present

## 2018-09-16 NOTE — Telephone Encounter (Signed)
Perfect plan! Thank You!

## 2018-09-16 NOTE — Progress Notes (Signed)
[facility] Consultation      Date: 09/16/2018,   MRN# [id number] [name] [birth year]      Admission                  Current  [name] is a 73 y.o. old female seen in consultation for Severe COPD Previous FEV1 50% predicted      VIDEO/TELEPHONE VISIT    In the setting of the current Covid19 crisis, you are scheduled for a  visit with me on 09/16/2018  Just as we do with many in-office visits, in order for you to participate in this visit, we must obtain consent.   I can obtain your verbal consent now.  PATIENT AGREES AND CONFIRMS -YES This Visit has Audio and Visual Capabilities for optimal patient care experience   Evaluation Performed:  Follow-up visit This visit type was conducted due to national recommendations for restrictions regarding the COVID-19 Pandemic (e.g. social distancing).  This format is felt to be most appropriate for this patient at this time.  All issues noted in this document were discussed and addressed.     Virtual Visit via Telephone Note   I connected with patient on 09/16/2018 by telephone and verified that I am speaking with the correct person using two identifiers.   I discussed the limitations, risks, security and privacy concerns of performing an evaluation and management service by telephone and the availability of in person appointments. I also discussed with the patient that there may be a patient responsible charge related to this service. The patient expressed understanding and agreed to proceed.   Location of the patient: Home Location of provider: Home Participating persons: Patient and provider only     CHIEF COMPLAINT:  Follow up COPD Follow up SOB and DOE followu  HISTORY OF PRESENT ILLNESS  +chronic SOB and DOE Quit smoking 5 years ago   Refuses to take inhaled steroids at this time Previous "allergy" tp BREO and Symbicort      Current Medication:  Current Outpatient Medications:  .   albuterol (PROVENTIL) (2.5 MG/3ML) 0.083% nebulizer solution, Take 3 mLs (2.5 mg total) by nebulization every 6 (six) hours as needed for wheezing or shortness of breath., Disp: 75 mL, Rfl: 12 .  amLODipine (NORVASC) 2.5 MG tablet, Take 2.5 mg by mouth daily., Disp: , Rfl:  .  aspirin EC 81 MG tablet, Take 1 tablet (81 mg total) by mouth daily., Disp: 90 tablet, Rfl: 3 .  azithromycin (ZITHROMAX Z-PAK) 250 MG tablet, Take 2 tablets on Day 1 and then 1 tablet daily till gone., Disp: 6 each, Rfl: 0 .  Calcium-Vitamin D (CALTRATE 600 PLUS-VIT D PO), Take 2 tablets by mouth 2 (two) times daily. , Disp: , Rfl:  .  FLUoxetine (PROZAC) 40 MG capsule, Take 40 mg by mouth daily. , Disp: , Rfl:  .  furosemide (LASIX) 20 MG tablet, Take 20 mg by mouth daily., Disp: , Rfl:  .  insulin NPH-regular Human (NOVOLIN 70/30) (70-30) 100 UNIT/ML injection, Inject 75u under the skin every morning and 95u under the skin every evening, Disp: , Rfl:  .  levothyroxine (SYNTHROID, LEVOTHROID) 125 MCG tablet, Take 125 mcg by mouth daily. , Disp: , Rfl:  .  metFORMIN (GLUCOPHAGE-XR) 500 MG 24 hr tablet, Take 500 mg by mouth every evening., Disp: , Rfl:  .  nitroGLYCERIN (NITROSTAT) 0.4 MG SL tablet, Place 1 tablet (0.4 mg total) under the tongue every 5 (five) minutes  as needed for chest pain., Disp: 25 tablet, Rfl: 1 .  ondansetron (ZOFRAN) 4 MG tablet, Take 1 tablet (4 mg total) by mouth every 6 (six) hours as needed for nausea., Disp: 20 tablet, Rfl: 0 .  pantoprazole (PROTONIX) 20 MG tablet, Take 20 mg by mouth daily., Disp: , Rfl:  .  predniSONE (DELTASONE) 20 MG tablet, Take 1 tablet (20 mg total) by mouth daily with breakfast. 10 days, Disp: 10 tablet, Rfl: 0 .  predniSONE (DELTASONE) 20 MG tablet, Take 1 tablet (20 mg total) by mouth daily., Disp: 10 tablet, Rfl: 0 .  ranolazine (RANEXA) 500 MG 12 hr tablet, Take 1 tablet (500 mg total) by mouth 2 (two) times daily., Disp: 60 tablet, Rfl: 6 .  umeclidinium bromide  (INCRUSE ELLIPTA) 62.5 MCG/INH AEPB, Inhale 1 puff into the lungs daily., Disp: 1 each, Rfl: 12  Current Facility-Administered Medications:  .  umeclidinium bromide (INCRUSE ELLIPTA) 62.5 MCG/INH 1 puff, 1 puff, Inhalation, Daily, Yann Biehn, MD    ALLERGIES   Amitriptyline; Benadryl [diphenhydramine]; Demerol [meperidine]; Gabapentin; Loratadine; Meperidine hcl; Mirtazapine; Olanzapine; Voltaren [diclofenac sodium]; Zetia [ezetimibe]; Ativan [lorazepam]; Atorvastatin; Budesonide-formoterol fumarate; Bupropion hcl; Caffeine; Codeine sulfate; Lisinopril; Metformin; Mometasone furoate; Morphine sulfate; Other; Oxycodone-acetaminophen; Pioglitazone; Propoxyphene n-acetaminophen; Rosuvastatin; Shellfish allergy; Suvorexant; Ticagrelor; Tramadol; Trazodone and nefazodone; Venlafaxine; Zolpidem tartrate; and Latex     REVIEW OF SYSTEMS   Review of Systems  Constitutional: Negative for chills, fever, malaise/fatigue and weight loss.  HENT: Negative for congestion and sore throat.   Respiratory: Positive for shortness of breath and wheezing. Negative for cough, hemoptysis and sputum production.   Cardiovascular: Negative for leg swelling.  Musculoskeletal: Negative.   Skin: Negative for rash.  Neurological: Negative for headaches.  Psychiatric/Behavioral: The patient is nervous/anxious.   All other systems reviewed and are negative.     CXR on 2/14 images reveiwed 5.16.19  ASSESSMENT/PLAN   73 year old white female follow-up today for end-stage COPD Gold stage D Patient with progressive SOB and DOE refuses to take inhaled steroids at this time She needs to be assess for exertional and nocturnal hypoxia  At this time, I will order Overnight pulse oximetry to assess for nocturnal hypoxia  COPD severe,end stage GOLD STAGE D Continue incruse as prescribed  Progressive SOB and DOE Needs 6MWT and ONO Will order ONO at this time   SOB and DOE From COPD, deconditioned state,  obesity   END STAGE COPD GOLD STAGE D Allergy to BREO and Symbicort Continue Incruse   Lung nodule 3 mm in 2017 in left lower lobe    Obesity -recommend significant weight loss -recommend changing diet  Deconditioned state -Recommend increased daily activity and exercise    TOTAL TIME SPENT 25 minutes  COVID-19 Education: The signs and symptoms of COVID-19 were discussed with the patient and how to seek care for testing (follow up with PCP or arrange E-visit).  The importance of social distancing was discussed today.   Patient/Family are satisfied with Plan of action and management. All questions answered  Corrin Parker, M.D.  [facility]  Medical Director [place] Director [facility] Cardio-Pulmonary Department

## 2018-09-16 NOTE — Telephone Encounter (Signed)
Noted.  Will close encounter at this time 

## 2018-09-16 NOTE — Telephone Encounter (Addendum)
Contacted pt to schedule 28mo rov and SMW. Pt was concerned about scheduling six minute walk test in June, as she was unsure if she could wait that long.  I have made pt aware that at this time, we are not currently doing six minutes walk test in office, due to covid-19.  Pt has been scheduled for SMW on 11/24/18 with f/u on 12/11/18. Advised pt to contact our office if she develops any new of worsen sx.  DK please advise if okay to wait until June for six minute walk.

## 2018-09-16 NOTE — Patient Instructions (Addendum)
Continue inhalers as prescribed Check ONO and 6MWT

## 2018-09-24 ENCOUNTER — Telehealth: Payer: Self-pay | Admitting: Internal Medicine

## 2018-09-24 DIAGNOSIS — J449 Chronic obstructive pulmonary disease, unspecified: Secondary | ICD-10-CM

## 2018-09-24 NOTE — Telephone Encounter (Signed)
ONO reviewed by Dr. Mortimer Fries.  Recommend starting 2L QHS. Pt's spouse, Roger(DPR) is aware of results and voiced his understanding.  Roger requested that order be sent to [facility].  Roger stating that he did not need to discuss results with pt, prior to placing oxygen order. Order has been placed. Nothing further is needed at this time.

## 2018-11-24 ENCOUNTER — Ambulatory Visit: Payer: Medicare HMO

## 2018-12-11 ENCOUNTER — Ambulatory Visit: Payer: Medicare HMO | Admitting: Internal Medicine

## 2019-01-07 ENCOUNTER — Other Ambulatory Visit: Payer: Self-pay

## 2019-01-07 ENCOUNTER — Emergency Department: Payer: Medicare HMO

## 2019-01-07 ENCOUNTER — Encounter: Payer: Self-pay | Admitting: Emergency Medicine

## 2019-01-07 ENCOUNTER — Observation Stay
Admission: EM | Admit: 2019-01-07 | Discharge: 2019-01-08 | Disposition: A | Discharge status: Home / Self Care | Payer: Medicare HMO | Attending: Internal Medicine | Admitting: Internal Medicine

## 2019-01-07 DIAGNOSIS — R918 Other nonspecific abnormal finding of lung field: Secondary | ICD-10-CM

## 2019-01-07 DIAGNOSIS — E890 Postprocedural hypothyroidism: Secondary | ICD-10-CM | POA: Insufficient documentation

## 2019-01-07 DIAGNOSIS — F418 Other specified anxiety disorders: Secondary | ICD-10-CM | POA: Diagnosis present

## 2019-01-07 DIAGNOSIS — Z794 Long term (current) use of insulin: Secondary | ICD-10-CM | POA: Insufficient documentation

## 2019-01-07 DIAGNOSIS — Z953 Presence of xenogenic heart valve: Secondary | ICD-10-CM | POA: Diagnosis not present

## 2019-01-07 DIAGNOSIS — F411 Generalized anxiety disorder: Secondary | ICD-10-CM | POA: Diagnosis not present

## 2019-01-07 DIAGNOSIS — Z20828 Contact with and (suspected) exposure to other viral communicable diseases: Secondary | ICD-10-CM | POA: Insufficient documentation

## 2019-01-07 DIAGNOSIS — E785 Hyperlipidemia, unspecified: Secondary | ICD-10-CM | POA: Diagnosis not present

## 2019-01-07 DIAGNOSIS — I700 Atherosclerosis of aorta: Secondary | ICD-10-CM | POA: Insufficient documentation

## 2019-01-07 DIAGNOSIS — I252 Old myocardial infarction: Secondary | ICD-10-CM | POA: Diagnosis not present

## 2019-01-07 DIAGNOSIS — J449 Chronic obstructive pulmonary disease, unspecified: Secondary | ICD-10-CM | POA: Diagnosis not present

## 2019-01-07 DIAGNOSIS — Z955 Presence of coronary angioplasty implant and graft: Secondary | ICD-10-CM | POA: Insufficient documentation

## 2019-01-07 DIAGNOSIS — I110 Hypertensive heart disease with heart failure: Secondary | ICD-10-CM | POA: Insufficient documentation

## 2019-01-07 DIAGNOSIS — K1120 Sialoadenitis, unspecified: Principal | ICD-10-CM | POA: Insufficient documentation

## 2019-01-07 DIAGNOSIS — Z79899 Other long term (current) drug therapy: Secondary | ICD-10-CM | POA: Insufficient documentation

## 2019-01-07 DIAGNOSIS — E7800 Pure hypercholesterolemia, unspecified: Secondary | ICD-10-CM | POA: Insufficient documentation

## 2019-01-07 DIAGNOSIS — M1990 Unspecified osteoarthritis, unspecified site: Secondary | ICD-10-CM | POA: Insufficient documentation

## 2019-01-07 DIAGNOSIS — Z951 Presence of aortocoronary bypass graft: Secondary | ICD-10-CM | POA: Insufficient documentation

## 2019-01-07 DIAGNOSIS — R609 Edema, unspecified: Secondary | ICD-10-CM | POA: Diagnosis present

## 2019-01-07 DIAGNOSIS — Z7982 Long term (current) use of aspirin: Secondary | ICD-10-CM | POA: Insufficient documentation

## 2019-01-07 DIAGNOSIS — I716 Thoracoabdominal aortic aneurysm, without rupture: Secondary | ICD-10-CM | POA: Diagnosis not present

## 2019-01-07 DIAGNOSIS — K219 Gastro-esophageal reflux disease without esophagitis: Secondary | ICD-10-CM | POA: Diagnosis present

## 2019-01-07 DIAGNOSIS — G4733 Obstructive sleep apnea (adult) (pediatric): Secondary | ICD-10-CM | POA: Diagnosis not present

## 2019-01-07 DIAGNOSIS — E119 Type 2 diabetes mellitus without complications: Secondary | ICD-10-CM

## 2019-01-07 DIAGNOSIS — I5032 Chronic diastolic (congestive) heart failure: Secondary | ICD-10-CM | POA: Diagnosis not present

## 2019-01-07 DIAGNOSIS — E1151 Type 2 diabetes mellitus with diabetic peripheral angiopathy without gangrene: Secondary | ICD-10-CM | POA: Insufficient documentation

## 2019-01-07 DIAGNOSIS — Z87891 Personal history of nicotine dependence: Secondary | ICD-10-CM | POA: Insufficient documentation

## 2019-01-07 DIAGNOSIS — F3341 Major depressive disorder, recurrent, in partial remission: Secondary | ICD-10-CM | POA: Diagnosis not present

## 2019-01-07 DIAGNOSIS — Z7952 Long term (current) use of systemic steroids: Secondary | ICD-10-CM | POA: Insufficient documentation

## 2019-01-07 DIAGNOSIS — E039 Hypothyroidism, unspecified: Secondary | ICD-10-CM | POA: Diagnosis present

## 2019-01-07 DIAGNOSIS — R599 Enlarged lymph nodes, unspecified: Secondary | ICD-10-CM | POA: Diagnosis not present

## 2019-01-07 DIAGNOSIS — I10 Essential (primary) hypertension: Secondary | ICD-10-CM | POA: Diagnosis present

## 2019-01-07 DIAGNOSIS — I2510 Atherosclerotic heart disease of native coronary artery without angina pectoris: Secondary | ICD-10-CM | POA: Diagnosis not present

## 2019-01-07 DIAGNOSIS — K5790 Diverticulosis of intestine, part unspecified, without perforation or abscess without bleeding: Secondary | ICD-10-CM | POA: Diagnosis not present

## 2019-01-07 DIAGNOSIS — Z79890 Hormone replacement therapy (postmenopausal): Secondary | ICD-10-CM | POA: Insufficient documentation

## 2019-01-07 LAB — CBC WITH DIFFERENTIAL/PLATELET
Abs Immature Granulocytes: 0.06 10*3/uL (ref 0.00–0.07)
Basophils Absolute: 0.1 10*3/uL (ref 0.0–0.1)
Basophils Relative: 1 %
Eosinophils Absolute: 0.2 10*3/uL (ref 0.0–0.5)
Eosinophils Relative: 2 %
HCT: 26.3 % — ABNORMAL LOW (ref 36.0–46.0)
Hemoglobin: 7.6 g/dL — ABNORMAL LOW (ref 12.0–15.0)
Immature Granulocytes: 1 %
Lymphocytes Relative: 18 %
Lymphs Abs: 1.7 10*3/uL (ref 0.7–4.0)
MCH: 20.9 pg — ABNORMAL LOW (ref 26.0–34.0)
MCHC: 28.9 g/dL — ABNORMAL LOW (ref 30.0–36.0)
MCV: 72.5 fL — ABNORMAL LOW (ref 80.0–100.0)
Monocytes Absolute: 0.8 10*3/uL (ref 0.1–1.0)
Monocytes Relative: 9 %
Neutro Abs: 6.9 10*3/uL (ref 1.7–7.7)
Neutrophils Relative %: 69 %
Platelets: 172 10*3/uL (ref 150–400)
RBC: 3.63 MIL/uL — ABNORMAL LOW (ref 3.87–5.11)
RDW: 17.9 % — ABNORMAL HIGH (ref 11.5–15.5)
WBC: 9.8 10*3/uL (ref 4.0–10.5)
nRBC: 0 % (ref 0.0–0.2)

## 2019-01-07 LAB — BASIC METABOLIC PANEL
Anion gap: 9 (ref 5–15)
BUN: 17 mg/dL (ref 8–23)
CO2: 22 mmol/L (ref 22–32)
Calcium: 8.3 mg/dL — ABNORMAL LOW (ref 8.9–10.3)
Chloride: 105 mmol/L (ref 98–111)
Creatinine, Ser: 0.98 mg/dL (ref 0.44–1.00)
GFR calc Af Amer: >60 mL/min (ref >60)
GFR calc non Af Amer: 57 mL/min — ABNORMAL LOW (ref >60)
Glucose, Bld: 133 mg/dL — ABNORMAL HIGH (ref 70–99)
Potassium: 4.9 mmol/L (ref 3.5–5.1)
Sodium: 136 mmol/L (ref 135–145)

## 2019-01-07 MED ORDER — SODIUM CHLORIDE 0.9 % IV SOLN
3.0000 g | Freq: Once | INTRAVENOUS | Status: AC
  Administered 2019-01-08: 3 g via INTRAVENOUS
  Filled 2019-01-07: qty 8

## 2019-01-07 MED ORDER — DEXAMETHASONE SODIUM PHOSPHATE 10 MG/ML IJ SOLN
10.0000 mg | Freq: Once | INTRAMUSCULAR | Status: AC
  Administered 2019-01-08: 10 mg via INTRAVENOUS
  Filled 2019-01-07: qty 1

## 2019-01-07 NOTE — ED Provider Notes (Signed)
[facility] Emergency Department Provider Note   ____________________________________________   First MD Initiated Contact with Patient 01/07/19 2222     (approximate)  I have reviewed the triage vital signs and the nursing notes.   HISTORY  Chief Complaint Dysphagia    HPI [name] is a 73 y.o. 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  She started having increased swelling around her left side of the face with same symptoms as previous, saw her doctor today for it and was started on Augmentin.  She reports she is taken 2 doses of this but the swelling of the left side of the face continues to increase.  She still able to swallow and breathe, but reports that this feels like the swelling started to get bigger feels like pushing just a little bit on the left side of her neck making it feel like it is pressing on her airway  No fevers or chills.  No exposure to COVID.  No nausea vomiting.  No chest pain.  Denies shortness of breath.  No wheezing.  Sees Dr. Richardson Landry of ENT regularly  Past Medical History:  Diagnosis Date   1st degree AV block    ACE-inhibitor cough    Allergic rhinitis    Anemia    iron deficiency anemia   Anxiety    Aortic ectasia ([place])    a. CT abd in 12/2016 incidentally noted aortic atherosclerosis and infrarenal abdominal aortic ectasia measuring as large as 2.7 cm with recommendation to repeat US in 2023.   Arthritis    Asthma    Cataract    Chronic depression    Chronic diastolic CHF (congestive heart failure) (HCC)    Chronic headache    COPD (chronic obstructive pulmonary disease) (HCC)    Coronary artery disease    a. DES to RCA and mid Cx 2009. b. CABG and bioprosthetic AVR May 2015. c. cutting balloon to prox Cx in 05/2016   Diabetes mellitus    type 2   Diverticulitis of colon    Essential hypertension    GERD  (gastroesophageal reflux disease)    Hearing loss    History of blood transfusion 2013   History of prosthetic aortic valve replacement    HOH (hard of hearing)    Hypercholesterolemia    intolerance of statins and niaspan   Mobitz type 1 second degree AV block    OSA (obstructive sleep apnea)    mild, intolerant of cpap   PAD (peripheral artery disease) ([place])    a. atherosclerosis by CT abd 12/2016 in [place].   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) 08/24/2016   Hyponatremia 08/24/2016   Leukocytosis 08/24/2016   Fever 08/24/2016   Diarrhea 08/24/2016   Generalized weakness 08/24/2016   Acute diverticulitis 08/16/2016   Lower GI bleed    Ataxia 08/09/2016   Dizziness 08/09/2016   Staggering gait 08/09/2016   NSTEMI (non-ST elevated myocardial infarction) (  [place])    AVB (atrioventricular block)    Atypical chest pain    Unstable angina ([place]) 05/25/2016   Palpitations 05/25/2016   PUD - recent Rx for H.Pylori 05/25/2016   Stenosis of coronary artery stent    Recurrent major depressive disorder, in partial remission ([place]) 07/29/2015   Parotiditis 04/25/2015   Viral URI 03/24/2015   Hypoglycemia 08/25/2014   Nausea without vomiting 08/25/2014   Dyslipidemia-statin ontol    Long-term insulin use ([place]) 03/31/2014   Aortic valve disorder 11/11/2013   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 10/12/2013   Coronary Artery Disease s/p CABG 10/2013 10/10/2013    Other malaise and fatigue 09/11/2013   Alopecia 03/06/2013   Insomnia 02/06/2013   Gastroesophageal reflux disease without esophagitis 01/19/2013   Dysphagia, unspecified(787.20) 01/19/2013   Dyspnea 11/17/2012   Weakness 11/17/2012   Abnormal MRI of head 09/05/2012   Depression with anxiety 03/21/2012   Extrinsic asthma 11/12/2011   Anemia 06/07/2011   Chronic obstructive pulmonary disease ([place]) 01/25/2010   Acquired hypothyroidism 08/10/2009   MICROSCOPIC HEMATURIA 05/07/2009   GOITER 03/02/2009   Essential hypertension 01/06/2009   MEMORY LOSS 06/23/2008   Obstructive sleep apnea 12/25/2007   Insulin dependent diabetes mellitus ([place]) 09/09/2006   ALLERGIC RHINITIS 09/09/2006   DIVERTICULOSIS, COLON 09/09/2006    Past Surgical History:  Procedure Laterality Date   ABDOMINAL HYSTERECTOMY     ABDOMINAL HYSTERECTOMY W/ PARTIAL VAGINACTOMY     AORTIC VALVE REPLACEMENT N/A 10/12/2013   Procedure: AORTIC VALVE REPLACEMENT (AVR);  Surgeon: [name], MD;  Location: [facility];  Service: Open Heart Surgery;  Laterality: N/A;   APPENDECTOMY  1964   BARTHOLIN GLAND CYST EXCISION     BLADDER SUSPENSION     BREAST BIOPSY Bilateral 09/11/2000   neg   BREAST BIOPSY Left 07/24/2010   neg   BREAST CYST EXCISION  1988   bilateral nonmalignant tumors, x3   CARDIAC CATHETERIZATION     CARDIAC CATHETERIZATION N/A 05/25/2016   Procedure: Coronary Balloon Angioplasty;  Surgeon: Leonie Man, MD;  Location: [facility];  Service: Cardiovascular;  Laterality: N/A;   CARDIAC CATHETERIZATION N/A 05/25/2016   Procedure: Coronary/Graft Angiography;  Surgeon: Leonie Man, MD;  Location: [place] [facility];  Service: Cardiovascular;  Laterality: N/A;   CATARACT EXTRACTION W/ INTRAOCULAR LENS  IMPLANT, BILATERAL     CHOLECYSTECTOMY  2001   COLECTOMY     lap sigmoid   COLONOSCOPY  2014   polyps found, 2 clamped off.   CORONARY ANGIOPLASTY  10/29/2007     Prox RCA & Mid Cx.   CORONARY ARTERY BYPASS GRAFT N/A 10/12/2013   Procedure: CORONARY ARTERY BYPASS GRAFT TIMES TWO;  Surgeon: [name], MD;  Location: [facility];  Service: Open Heart Surgery;  Laterality: N/A;   CORONARY/GRAFT ANGIOGRAPHY N/A 09/20/2017   Procedure: CORONARY/GRAFT ANGIOGRAPHY;  Surgeon: Sherren Mocha, MD;  Location: [facility];  Service: Cardiovascular;  Laterality: N/A;   LEFT HEART CATHETERIZATION WITH CORONARY ANGIOGRAM N/A 10/09/2013   Procedure: LEFT HEART CATHETERIZATION WITH CORONARY ANGIOGRAM;  Surgeon: [name], MD;  Location: [facility] C[facility];  Service: Cardiovascular;  Laterality: N/A;   STERNAL WIRES REMOVAL N/A 04/13/2014   Procedure: STERNAL WIRES REMOVAL;  Surgeon: [name], MD;  Location: [facility];  Service: Thoracic;  Laterality: N/A;   THYROIDECTOMY     TONSILLECTOMY     TUBAL LIGATION     VAGINAL DELIVERY     3  VISCERAL ARTERY INTERVENTION N/A 08/16/2016   Procedure: Visceral Artery Intervention;  Surgeon: Algernon Huxley, MD;  Location: [facility];  Service: Cardiovascular;  Laterality: N/A;    Prior to Admission medications   Medication Sig Start Date End Date Taking? Authorizing Provider  albuterol (PROVENTIL) (2.5 MG/3ML) 0.083% nebulizer solution Take 3 mLs (2.5 mg total) by nebulization every 6 (six) hours as needed for wheezing or shortness of breath. 08/13/18   Flora Lipps, MD  amLODipine (NORVASC) 2.5 MG tablet Take 2.5 mg by mouth daily.    [provider]  aspirin EC 81 MG tablet Take 1 tablet (81 mg total) by mouth daily. 01/28/17   Dunn, Nedra Hai, PA-C  azithromycin (ZITHROMAX Z-PAK) 250 MG tablet Take 2 tablets on Day 1 and then 1 tablet daily till gone. 08/13/18   Flora Lipps, MD  Calcium-Vitamin D (CALTRATE 600 PLUS-VIT D PO) Take 2 tablets by mouth 2 (two) times daily.     [provider]  FLUoxetine (PROZAC) 40 MG capsule Take 40 mg by mouth daily.     [provider]   furosemide (LASIX) 20 MG tablet Take 20 mg by mouth daily.    [provider]  insulin NPH-regular Human (NOVOLIN 70/30) (70-30) 100 UNIT/ML injection Inject 75u under the skin every morning and 95u under the skin every evening    [provider]  levothyroxine (SYNTHROID, LEVOTHROID) 125 MCG tablet Take 125 mcg by mouth daily.  04/03/17   [provider]  metFORMIN (GLUCOPHAGE-XR) 500 MG 24 hr tablet Take 500 mg by mouth every evening.    [provider]  nitroGLYCERIN (NITROSTAT) 0.4 MG SL tablet Place 1 tablet (0.4 mg total) under the tongue every 5 (five) minutes as needed for chest pain. 07/25/17   Lyda Jester M, PA-C  ondansetron (ZOFRAN) 4 MG tablet Take 1 tablet (4 mg total) by mouth every 6 (six) hours as needed for nausea. 08/24/16   Theodoro Grist, MD  pantoprazole (PROTONIX) 20 MG tablet Take 20 mg by mouth daily. 01/23/17   [provider]  predniSONE (DELTASONE) 20 MG tablet Take 1 tablet (20 mg total) by mouth daily with breakfast. 10 days 08/13/18   Flora Lipps, MD  predniSONE (DELTASONE) 20 MG tablet Take 1 tablet (20 mg total) by mouth daily. 08/27/18 08/27/19  Flora Lipps, MD  ranolazine (RANEXA) 500 MG 12 hr tablet Take 1 tablet (500 mg total) by mouth 2 (two) times daily. 03/05/18   Burtis Junes, NP  umeclidinium bromide (INCRUSE ELLIPTA) 62.5 MCG/INH AEPB Inhale 1 puff into the lungs daily. 10/17/17   Flora Lipps, MD    Allergies Amitriptyline, Benadryl [diphenhydramine], Demerol [meperidine], Gabapentin, Loratadine, Meperidine hcl, Mirtazapine, Olanzapine, Voltaren [diclofenac sodium], Zetia [ezetimibe], Ativan [lorazepam], Atorvastatin, Budesonide-formoterol fumarate, Bupropion hcl, Caffeine, Codeine sulfate, Lisinopril, Metformin, Mometasone furoate, Morphine sulfate, Other, Oxycodone-acetaminophen, Pioglitazone, Propoxyphene n-acetaminophen, Rosuvastatin, Shellfish allergy, Suvorexant, Ticagrelor, Tramadol, Trazodone and  nefazodone, Venlafaxine, Zolpidem tartrate, and Latex  Family History  Problem Relation Age of Onset   Breast cancer Mother 37   Hypertension Father    Mesothelioma Father    Asthma Father    Stroke Paternal Grandfather    Heart disease Other    Breast cancer Maternal Aunt    Breast cancer Paternal Aunt     Social History Social History   Tobacco Use   Smoking status: Former Smoker    Packs/day: 0.50    Years: 30.00    Pack years: 15.00  Types: Cigarettes    Quit date: 10/02/2013    Years since quitting: 5.2   Smokeless tobacco: Never Used  Substance Use Topics   Alcohol use: No   Drug use: No    Review of Systems Constitutional: No fever/chills Eyes: No visual changes. ENT: No sore throat.  Feels like a pressure over the left side of her throat from swelling of her parotid gland.  Denies dental issue.  She is not felt any pus or drainage. Cardiovascular: Denies chest pain. Respiratory: Denies shortness of breath. Gastrointestinal: No abdominal pain.   Genitourinary: Negative for dysuria. Musculoskeletal: Negative for back pain. Skin: Negative for rash. Neurological: Negative for headaches, areas of focal weakness or numbness.    ____________________________________________   PHYSICAL EXAM:  VITAL SIGNS: ED Triage Vitals  Enc Vitals Group     BP 01/07/19 2205 (!) 129/107     Pulse Rate 01/07/19 2205 89     Resp 01/07/19 2205 18     Temp 01/07/19 2205 98.7 F (37.1 C)     Temp Source 01/07/19 2205 Oral     SpO2 01/07/19 2205 98 %     Weight 01/07/19 2206 200 lb (90.7 kg)     Height 01/07/19 2206 5\' 6"  (1.676 m)     Head Circumference --      Peak Flow --      Pain Score 01/07/19 2206 2     Pain Loc --      Pain Edu? --      Excl. in [place]? --     Constitutional: Alert and oriented. Well appearing and in no acute distress. Eyes: Conjunctivae are normal. Head: Atraumatic. Nose: No congestion/rhinnorhea. Mouth/Throat: Mucous membranes are  moist.  There is some slight fullness over the left side of the cheek, also extending from the region of the mandibular triangle extending to the base of the left ear down into about the mid left neck.  She is able to open and close her mandible well.  The oropharynx is widely patent, there is no midline shift, no stridor, no sub-lingular swelling or elevation is noted.  The patient does not have evidence of acute airway compromise, but does have some slight mass affect noted externally, I do not see evidence of acute airway compromise however Neck: No stridor.  Cardiovascular: Normal rate, regular rhythm. Grossly normal heart sounds.  Good peripheral circulation. Respiratory: Normal respiratory effort.  No retractions. Lungs CTAB. Gastrointestinal: Soft and nontender. No distention. Musculoskeletal: No lower extremity tenderness nor edema. Neurologic:  Normal speech and language. No gross focal neurologic deficits are appreciated.  Skin:  Skin is warm, dry and intact. No rash noted. Psychiatric: Mood and affect are normal. Speech and behavior are normal.  ____________________________________________   LABS (all labs ordered are listed, but only abnormal results are displayed)  Labs Reviewed  CBC WITH DIFFERENTIAL/PLATELET - Abnormal; Notable for the following components:      Result Value   RBC 3.63 (*)    Hemoglobin 7.6 (*)    HCT 26.3 (*)    MCV 72.5 (*)    MCH 20.9 (*)    MCHC 28.9 (*)    RDW 17.9 (*)    All other components within normal limits  BASIC METABOLIC PANEL - Abnormal; Notable for the following components:   Glucose, Bld 133 (*)    Calcium 8.3 (*)    GFR calc non Af Amer 57 (*)    All other components within normal limits  SARS CORONAVIRUS  2 (HOSPITAL ORDER, [facility])   ____________________________________________  EKG   ____________________________________________  RADIOLOGY  Ct Soft Tissue Neck Wo Contrast  Result Date:  01/07/2019 CLINICAL DATA:  Recurrent parotiditis. Left neck swelling. Difficulty swallowing. EXAM: CT NECK WITHOUT CONTRAST TECHNIQUE: Multidetector CT imaging of the neck was performed following the standard protocol without intravenous contrast. COMPARISON:  None. FINDINGS: Pharynx and larynx: Normal. No mass or swelling. Salivary glands: There is mild enlargement of the left submandibular gland with moderate surrounding subcutaneous fat stranding and thickening of the platysma. No sialadenitis. There is slight asymmetry of the parotid glands, but the small amount of inflammation adjacent to the left parotid gland is likely extending from the submandibular gland. Thyroid: Status post thyroidectomy Lymph nodes: Multiple subcentimeter left cervical lymph nodes. None enlarged or abnormal density. Vascular: Partial retropharyngeal course of both carotids. Mild aortic atherosclerosis. Limited intracranial: Normal Visualized orbits: Normal Mastoids and visualized paranasal sinuses: Clear Skeleton: Multilevel facet arthrosis. No bony spinal canal stenosis. Upper chest: Clear Other: None IMPRESSION: Enlargement of the left submandibular gland with moderate surrounding inflammatory stranding, likely indicating submandibular sialadenitis. Slight stranding adjacent to the left parotid gland is favored to be an extension of the left submandibular process. Aortic atherosclerosis (ICD10-I70.0). Electronically Signed   By: Ulyses Jarred M.D.   On: 01/07/2019 23:02    ____________________________________________   PROCEDURES  Procedure(s) performed: None  Procedures  Critical Care performed: No  ____________________________________________   INITIAL IMPRESSION / ASSESSMENT AND PLAN / ED COURSE  Pertinent labs & imaging results that were available during my care of the patient were reviewed by me and considered in my medical decision making (see chart for details).   Patient presents for concerns of left-sided  neck swelling, afebrile, and she likely does have parotitis.  I do not know with certainty if this would be bacterial in nature, would also consider parotid obstruction, inflammatory etiology: Also infection though there is no overlying erythema and it is only mildly tender without fever.  I ordered CT neck to further evaluate, patient reports a history of multiple allergies and Sjogren's syndrome, will order noncontrast as the patient reports she has many allergies and very difficult IV access.  Look for gross evidence of abscess  Clinical Course as of Jan 06 2358  Wed Jan 07, 2019  2231 Paged ENT   [MQ]    Clinical Course User Index [MQ] Delman Kitten, MD   Patient has already initiated 2 doses of antibiotic.  ----------------------------------------- 11:59 PM on 01/07/2019 -----------------------------------------  Discussed case and care with Dr. Tami Ribas, reviewed imaging as well as history.  Advises initiate IV Unasyn, IV Decadron, ENT will see in follow-up in the morning.  Hospitalist Dr. Jannifer Franklin contacted who will admit.  Patient comfortable with the plan.  Admit for observation due to the sialoadenitis and assure airway involvement does not become present.  Currently resting without distress or evidence of acute airway involvement.  ____________________________________________   FINAL CLINICAL IMPRESSION(S) / ED DIAGNOSES  Final diagnoses:  Submandibular gland swelling  Sialoadenitis        Note:  This document was prepared using Dragon voice recognition software and may include unintentional dictation errors       Delman Kitten, MD 01/08/19 0000

## 2019-01-07 NOTE — ED Notes (Signed)
Parotid gland began swelling yesterday and pt was given rx for ampicillin- pt feels like it is effecting her ability to swallow and breath- "it feels like its choking"

## 2019-01-07 NOTE — ED Notes (Signed)
Patient transported to CT 

## 2019-01-07 NOTE — ED Triage Notes (Signed)
Pt to triage via w/c with no distress noted, mask in place; pt reports parotid gland swelling; rx ampicillin (2 doses taken); having increased swelling with difficulty swallowing

## 2019-01-08 LAB — IRON AND TIBC
Iron: 15 ug/dL — ABNORMAL LOW (ref 28–170)
Saturation Ratios: 4 % — ABNORMAL LOW (ref 10.4–31.8)
TIBC: 384 ug/dL (ref 250–450)
UIBC: 369 ug/dL

## 2019-01-08 LAB — VITAMIN B12: Vitamin B-12: 422 pg/mL (ref 180–914)

## 2019-01-08 LAB — HEMOGLOBIN A1C
Hgb A1c MFr Bld: 6.9 % — ABNORMAL HIGH (ref 4.8–5.6)
Mean Plasma Glucose: 151.33 mg/dL

## 2019-01-08 LAB — SARS CORONAVIRUS 2 BY RT PCR (HOSPITAL ORDER, PERFORMED IN ~~LOC~~ HOSPITAL LAB): SARS Coronavirus 2: NEGATIVE

## 2019-01-08 LAB — FERRITIN: Ferritin: 15 ng/mL (ref 11–307)

## 2019-01-08 LAB — GLUCOSE, CAPILLARY
Glucose-Capillary: 242 mg/dL — ABNORMAL HIGH (ref 70–99)
Glucose-Capillary: 372 mg/dL — ABNORMAL HIGH (ref 70–99)

## 2019-01-08 LAB — OCCULT BLOOD X 1 CARD TO LAB, STOOL: Fecal Occult Bld: NEGATIVE

## 2019-01-08 MED ORDER — RANOLAZINE ER 500 MG PO TB12
500.0000 mg | ORAL_TABLET | Freq: Two times a day (BID) | ORAL | Status: DC
  Administered 2019-01-08: 09:00:00 500 mg via ORAL
  Filled 2019-01-08 (×2): qty 1

## 2019-01-08 MED ORDER — ONDANSETRON HCL 4 MG/2ML IJ SOLN: 4.0000 mg | Freq: Four times a day (QID) | INTRAMUSCULAR | Status: DC | PRN

## 2019-01-08 MED ORDER — PREDNISONE 10 MG PO TABS: ORAL_TABLET | ORAL | 0 refills | Status: DC

## 2019-01-08 MED ORDER — ONDANSETRON HCL 4 MG PO TABS: 4.0000 mg | ORAL_TABLET | Freq: Four times a day (QID) | ORAL | Status: DC | PRN

## 2019-01-08 MED ORDER — PROMETHAZINE HCL 25 MG PO TABS
25.0000 mg | ORAL_TABLET | Freq: Four times a day (QID) | ORAL | Status: DC | PRN
  Filled 2019-01-08: qty 1

## 2019-01-08 MED ORDER — SODIUM CHLORIDE 0.9 % IV SOLN: 3.0000 g | Freq: Two times a day (BID) | INTRAVENOUS | Status: DC

## 2019-01-08 MED ORDER — NITROGLYCERIN 0.4 MG SL SUBL: 0.4000 mg | SUBLINGUAL_TABLET | SUBLINGUAL | Status: DC | PRN

## 2019-01-08 MED ORDER — IRBESARTAN 150 MG PO TABS: 150.0000 mg | ORAL_TABLET | Freq: Every day | ORAL | Status: DC

## 2019-01-08 MED ORDER — VENLAFAXINE HCL ER 37.5 MG PO CP24
37.5000 mg | ORAL_CAPSULE | Freq: Every day | ORAL | Status: DC
  Administered 2019-01-08: 37.5 mg via ORAL
  Filled 2019-01-08: qty 1

## 2019-01-08 MED ORDER — SODIUM CHLORIDE 0.9 % IV SOLN
3.0000 g | Freq: Four times a day (QID) | INTRAVENOUS | Status: DC
  Administered 2019-01-08 (×2): 3 g via INTRAVENOUS
  Filled 2019-01-08 (×3): qty 8
  Filled 2019-01-08: qty 3
  Filled 2019-01-08: qty 8
  Filled 2019-01-08: qty 3

## 2019-01-08 MED ORDER — TRAZODONE HCL 50 MG PO TABS: 100.0000 mg | ORAL_TABLET | Freq: Every evening | ORAL | Status: DC | PRN

## 2019-01-08 MED ORDER — ACETAMINOPHEN 325 MG PO TABS
650.0000 mg | ORAL_TABLET | Freq: Four times a day (QID) | ORAL | Status: DC | PRN
  Administered 2019-01-08: 08:00:00 650 mg via ORAL
  Filled 2019-01-08: qty 2

## 2019-01-08 MED ORDER — SODIUM CHLORIDE 0.9 % IV SOLN
300.0000 mg | Freq: Once | INTRAVENOUS | Status: DC
  Filled 2019-01-08: qty 15

## 2019-01-08 MED ORDER — FUROSEMIDE 20 MG PO TABS: 20.0000 mg | ORAL_TABLET | Freq: Every day | ORAL | Status: DC | PRN

## 2019-01-08 MED ORDER — BUDESONIDE 0.5 MG/2ML IN SUSP: 0.5000 mg | Freq: Every day | RESPIRATORY_TRACT | Status: DC | PRN

## 2019-01-08 MED ORDER — SODIUM CHLORIDE 0.9 % IV SOLN
300.0000 mg | Freq: Once | INTRAVENOUS | Status: AC
  Administered 2019-01-08: 300 mg via INTRAVENOUS
  Filled 2019-01-08: qty 15

## 2019-01-08 MED ORDER — INSULIN ASPART PROT & ASPART (70-30 MIX) 100 UNIT/ML ~~LOC~~ SUSP: 75.0000 [IU] | Freq: Every day | SUBCUTANEOUS | Status: DC

## 2019-01-08 MED ORDER — ENOXAPARIN SODIUM 40 MG/0.4ML ~~LOC~~ SOLN
40.0000 mg | SUBCUTANEOUS | Status: DC
  Administered 2019-01-08: 09:00:00 40 mg via SUBCUTANEOUS
  Filled 2019-01-08: qty 0.4

## 2019-01-08 MED ORDER — CITALOPRAM HYDROBROMIDE 20 MG PO TABS
20.0000 mg | ORAL_TABLET | Freq: Every day | ORAL | Status: DC
  Administered 2019-01-08: 20 mg via ORAL
  Filled 2019-01-08: qty 1

## 2019-01-08 MED ORDER — PANTOPRAZOLE SODIUM 20 MG PO TBEC
20.0000 mg | DELAYED_RELEASE_TABLET | Freq: Every day | ORAL | Status: DC
  Administered 2019-01-08: 20 mg via ORAL
  Filled 2019-01-08: qty 1

## 2019-01-08 MED ORDER — ALBUTEROL SULFATE (2.5 MG/3ML) 0.083% IN NEBU: 2.5000 mg | INHALATION_SOLUTION | Freq: Four times a day (QID) | RESPIRATORY_TRACT | Status: DC | PRN

## 2019-01-08 MED ORDER — INSULIN ASPART PROT & ASPART (70-30 MIX) 100 UNIT/ML ~~LOC~~ SUSP
95.0000 [IU] | Freq: Every day | SUBCUTANEOUS | Status: DC
  Administered 2019-01-08: 95 [IU] via SUBCUTANEOUS
  Filled 2019-01-08: qty 10

## 2019-01-08 MED ORDER — INSULIN ASPART 100 UNIT/ML ~~LOC~~ SOLN: 0.0000 [IU] | Freq: Every day | SUBCUTANEOUS | Status: DC

## 2019-01-08 MED ORDER — LEVOTHYROXINE SODIUM 25 MCG PO TABS
125.0000 ug | ORAL_TABLET | Freq: Every day | ORAL | Status: DC
  Administered 2019-01-08: 125 ug via ORAL
  Filled 2019-01-08: qty 1

## 2019-01-08 MED ORDER — INSULIN ASPART 100 UNIT/ML ~~LOC~~ SOLN
0.0000 [IU] | Freq: Three times a day (TID) | SUBCUTANEOUS | Status: DC
  Administered 2019-01-08: 13:00:00 9 [IU] via SUBCUTANEOUS
  Administered 2019-01-08: 09:00:00 3 [IU] via SUBCUTANEOUS
  Filled 2019-01-08 (×2): qty 1

## 2019-01-08 MED ORDER — ARIPIPRAZOLE 2 MG PO TABS
2.0000 mg | ORAL_TABLET | Freq: Every day | ORAL | Status: DC
  Administered 2019-01-08: 2 mg via ORAL
  Filled 2019-01-08: qty 1

## 2019-01-08 MED ORDER — AMOXICILLIN-POT CLAVULANATE 875-125 MG PO TABS: 1.0000 | ORAL_TABLET | Freq: Two times a day (BID) | ORAL | 0 refills | Status: AC

## 2019-01-08 MED ORDER — UMECLIDINIUM BROMIDE 62.5 MCG/INH IN AEPB
1.0000 | INHALATION_SPRAY | Freq: Every day | RESPIRATORY_TRACT | Status: DC
  Administered 2019-01-08: 10:00:00 1 via RESPIRATORY_TRACT
  Filled 2019-01-08: qty 7

## 2019-01-08 NOTE — Progress Notes (Signed)
Inpatient Diabetes Program Recommendations  AACE/ADA: New Consensus Statement on Inpatient Glycemic Control (2015)  Target Ranges:  Prepandial:   less than 140 mg/dL      Peak postprandial:   less than 180 mg/dL (1-2 hours)      Critically ill patients:  140 - 180 mg/dL   Lab Results  Component Value Date   GLUCAP 242 (H) 01/08/2019   HGBA1C 8.2 (H) 09/19/2017    Review of Glycemic Control Results for [name], [name] (MRN [id number]) as of 01/08/2019 09:07  Ref. Range 01/08/2019 08:00  Glucose-Capillary Latest Ref Range: 70 - 99 mg/dL 242 (H)   Diabetes history: DM 2 Outpatient Diabetes medications:  Novolin 70/30 95 units in the AM and 75 units in the PM Metformin 1000 mg bid Current orders for Inpatient glycemic control:  Novolog mix 70/30 95 units q AM and 75 units with supper Novolog sensitive tid with meals and HS Inpatient Diabetes Program Recommendations:   Call received from RN, Stanton Kidney regarding insulin orders.  Secure chatted MD, who states that patient will likely have more steroids.  Discussed with RN that ordered verified with MD.   Thanks  Adah Perl, RN, BC-ADM Inpatient Diabetes Coordinator Pager [phone] (8a-5p)

## 2019-01-08 NOTE — Plan of Care (Addendum)
Pt d/ced home. Reviewed d/c instructions, new medications and f/u appts.  IV removed after pt received abx and iron IV.  She will be on prednisone taper and abx.  Husband is present to take her home. Also suggested pt eat things that are easy to swallow if she experiences dysphagia.

## 2019-01-08 NOTE — H&P (Signed)
[facility] [place] NAME: [name]    MR#:  [id number]  DATE OF BIRTH:  [birth year]  DATE OF ADMISSION:  01/07/2019  PRIMARY CARE PHYSICIAN: Glendon Axe, MD   REQUESTING/REFERRING PHYSICIAN: Jacqualine Code, MD  CHIEF COMPLAINT:   Chief Complaint  Patient presents with  . Dysphagia    HISTORY OF PRESENT ILLNESS:  [name]  is a 73 y.o. female who presents with chief complaint as above.  Patient presents with complaint of left-sided swelling of her face.  She has a history of recurrent sialadenitis.  She was started on a dose of outpatient antibiotics, but her symptoms got worse over the last 24 hours or so.  The swelling started extending down into her neck.  ED physician spoke with ENT Dr. Tami Ribas, who recommended IV antibiotics and dose of steroids and admission to the hospital, and they will see her.  Hospitalist were called for admission.  PAST MEDICAL HISTORY:   Past Medical History:  Diagnosis Date  . 1st degree AV block   . ACE-inhibitor cough   . Allergic rhinitis   . Anemia    iron deficiency anemia  . Anxiety   . Aortic ectasia (HCC)    a. CT abd in 12/2016 incidentally noted aortic atherosclerosis and infrarenal abdominal aortic ectasia measuring as large as 2.7 cm with recommendation to repeat US in 2023.  Marland Kitchen Arthritis   . Asthma   . Cataract   . Chronic depression   . Chronic diastolic CHF (congestive heart failure) ([place])   . Chronic headache   . COPD (chronic obstructive pulmonary disease) ([place])   . Coronary artery disease    a. DES to RCA and mid Cx 2009. b. CABG and bioprosthetic AVR May 2015. c. cutting balloon to prox Cx in 05/2016  . Diabetes mellitus    type 2  . Diverticulitis of colon   . Essential hypertension   . GERD (gastroesophageal reflux disease)   . Hearing loss   . History of blood transfusion 2013  . History of prosthetic aortic valve replacement   . HOH (hard of hearing)   .  Hypercholesterolemia    intolerance of statins and niaspan  . Mobitz type 1 second degree AV block   . OSA (obstructive sleep apnea)    mild, intolerant of cpap  . PAD (peripheral artery disease) ([place])    a. atherosclerosis by CT abd 12/2016 in [place].  Marland Kitchen PONV (postoperative nausea and vomiting)   . Statin intolerance   . Thyroid disease      PAST SURGICAL HISTORY:   Past Surgical History:  Procedure Laterality Date  . ABDOMINAL HYSTERECTOMY    . ABDOMINAL HYSTERECTOMY W/ PARTIAL VAGINACTOMY    . AORTIC VALVE REPLACEMENT N/A 10/12/2013   Procedure: AORTIC VALVE REPLACEMENT (AVR);  Surgeon: [name], MD;  Location: [facility];  Service: Open Heart Surgery;  Laterality: N/A;  . [place]  . BARTHOLIN GLAND CYST EXCISION    . BLADDER SUSPENSION    . BREAST BIOPSY Bilateral 09/11/2000   neg  . BREAST BIOPSY Left 07/24/2010   neg  . BREAST CYST EXCISION  1988   bilateral nonmalignant tumors, x3  . CARDIAC CATHETERIZATION    . CARDIAC CATHETERIZATION N/A 05/25/2016   Procedure: Coronary Balloon Angioplasty;  Surgeon: Leonie Man, MD;  Location: [facility];  Service: Cardiovascular;  Laterality: N/A;  . CARDIAC CATHETERIZATION N/A 05/25/2016   Procedure: Coronary/Graft Angiography;  Surgeon: Leonie Man, MD;  Location: [facility];  Service: Cardiovascular;  Laterality: N/A;  . CATARACT EXTRACTION W/ INTRAOCULAR LENS  IMPLANT, BILATERAL    . CHOLECYSTECTOMY  2001  . COLECTOMY     lap sigmoid  . COLONOSCOPY  2014   polyps found, 2 clamped off.  . CORONARY ANGIOPLASTY  10/29/2007   Prox RCA & Mid Cx.  . CORONARY ARTERY BYPASS GRAFT N/A 10/12/2013   Procedure: CORONARY ARTERY BYPASS GRAFT TIMES TWO;  Surgeon: [name], MD;  Location: [facility];  Service: Open Heart Surgery;  Laterality: N/A;  . CORONARY/GRAFT ANGIOGRAPHY N/A 09/20/2017   Procedure: CORONARY/GRAFT ANGIOGRAPHY;  Surgeon: Sherren Mocha, MD;  Location: [facility];  Service:  Cardiovascular;  Laterality: N/A;  . LEFT HEART CATHETERIZATION WITH CORONARY ANGIOGRAM N/A 10/09/2013   Procedure: LEFT HEART CATHETERIZATION WITH CORONARY ANGIOGRAM;  Surgeon: [name], MD;  Location: [facility];  Service: Cardiovascular;  Laterality: N/A;  . STERNAL WIRES REMOVAL N/A 04/13/2014   Procedure: STERNAL WIRES REMOVAL;  Surgeon: [name], MD;  Location: [facility];  Service: Thoracic;  Laterality: N/A;  . THYROIDECTOMY    . TONSILLECTOMY    . TUBAL LIGATION    . VAGINAL DELIVERY     3  . VISCERAL ARTERY INTERVENTION N/A 08/16/2016   Procedure: Visceral Artery Intervention;  Surgeon: Algernon Huxley, MD;  Location: [facility];  Service: Cardiovascular;  Laterality: N/A;     SOCIAL HISTORY:   Social History   Tobacco Use  . Smoking status: Former Smoker    Packs/day: 0.50    Years: 30.00    Pack years: 15.00    Types: Cigarettes    Quit date: 10/02/2013    Years since quitting: 5.2  . Smokeless tobacco: Never Used  Substance Use Topics  . Alcohol use: No     FAMILY HISTORY:   Family History  Problem Relation Age of Onset  . Breast cancer Mother 33  . Hypertension Father   . Mesothelioma Father   . Asthma Father   . Stroke Paternal Grandfather   . Heart disease Other   . Breast cancer Maternal Aunt   . Breast cancer Paternal Aunt      DRUG ALLERGIES:   Allergies  Allergen Reactions  . Amitriptyline Other (See Comments)    Unknown reaction  . Benadryl [Diphenhydramine] Shortness Of Breath  . Demerol [Meperidine] Other (See Comments)    Unknown reaction  . Gabapentin Other (See Comments)    Unknown reaction  . Loratadine Other (See Comments)    Unknown reaction  . Meperidine Hcl Other (See Comments)    Unknown reaction  . Mirtazapine Other (See Comments)    Unknown reaction  . Olanzapine Other (See Comments)    Unknown reaction   . Voltaren [Diclofenac Sodium] Shortness Of Breath  . Zetia [Ezetimibe] Other (See Comments)     Weakness in legs, shakiness all over  . Ativan [Lorazepam] Other (See Comments)    Causes double vision at highter than .5 mg dose  . Atorvastatin Other (See Comments)    Muscle aches and weakness  . Budesonide-Formoterol Fumarate Other (See Comments)    Shakiness, tremors  . Bupropion Hcl Other (See Comments)    "cloud over me" depression  . Caffeine Other (See Comments)    jitters  . Codeine Sulfate Other (See Comments)    Makes chest hurt like a heart attack  . Lisinopril Cough  .  Metformin Nausea And Vomiting  . Mometasone Furoate Nausea And Vomiting  . Morphine Sulfate Other (See Comments)    Chest pain like a heart attack  . Other Other (See Comments)    Beta Blockers, reaction shortness of breath  . Oxycodone-Acetaminophen Nausea And Vomiting  . Pioglitazone Other (See Comments)    Cannot take because of risk of bladder cancer  . Propoxyphene N-Acetaminophen Nausea And Vomiting  . Rosuvastatin Other (See Comments)    Muscle aches and weakness  . Shellfish Allergy Diarrhea  . Suvorexant Other (See Comments)    Jerking/nervous   . Ticagrelor     Other reaction(s): Other (See Comments) "slowed heart rate" & chest pain  . Tramadol Nausea Only  . Trazodone And Nefazodone Nausea And Vomiting  . Venlafaxine Other (See Comments)    Unknown reaction  . Zolpidem Tartrate Other (See Comments)     Jittery, diarrhea  . Latex Rash    MEDICATIONS AT HOME:   Prior to Admission medications   Medication Sig Start Date End Date Taking? Authorizing Provider  albuterol (PROVENTIL) (2.5 MG/3ML) 0.083% nebulizer solution Take 3 mLs (2.5 mg total) by nebulization every 6 (six) hours as needed for wheezing or shortness of breath. 08/13/18   Flora Lipps, MD  amLODipine (NORVASC) 2.5 MG tablet Take 2.5 mg by mouth daily.    [provider]  aspirin EC 81 MG tablet Take 1 tablet (81 mg total) by mouth daily. 01/28/17   Dunn, Nedra Hai, PA-C  azithromycin (ZITHROMAX Z-PAK) 250 MG  tablet Take 2 tablets on Day 1 and then 1 tablet daily till gone. Patient not taking: Reported on 01/08/2019 08/13/18   Flora Lipps, MD  Calcium-Vitamin D (CALTRATE 600 PLUS-VIT D PO) Take 2 tablets by mouth 2 (two) times daily.     [provider]  FLUoxetine (PROZAC) 40 MG capsule Take 40 mg by mouth daily.     [provider]  furosemide (LASIX) 20 MG tablet Take 20 mg by mouth daily.    [provider]  insulin NPH-regular Human (NOVOLIN 70/30) (70-30) 100 UNIT/ML injection Inject 75u under the skin every morning and 95u under the skin every evening    [provider]  levothyroxine (SYNTHROID, LEVOTHROID) 125 MCG tablet Take 125 mcg by mouth daily.  04/03/17   [provider]  metFORMIN (GLUCOPHAGE-XR) 500 MG 24 hr tablet Take 500 mg by mouth every evening.    [provider]  nitroGLYCERIN (NITROSTAT) 0.4 MG SL tablet Place 1 tablet (0.4 mg total) under the tongue every 5 (five) minutes as needed for chest pain. 07/25/17   Lyda Jester M, PA-C  pantoprazole (PROTONIX) 20 MG tablet Take 20 mg by mouth daily. 01/23/17   [provider]  predniSONE (DELTASONE) 20 MG tablet Take 1 tablet (20 mg total) by mouth daily with breakfast. 10 days 08/13/18   Flora Lipps, MD  predniSONE (DELTASONE) 20 MG tablet Take 1 tablet (20 mg total) by mouth daily. 08/27/18 08/27/19  Flora Lipps, MD  ranolazine (RANEXA) 500 MG 12 hr tablet Take 1 tablet (500 mg total) by mouth 2 (two) times daily. 03/05/18   Burtis Junes, NP  umeclidinium bromide (INCRUSE ELLIPTA) 62.5 MCG/INH AEPB Inhale 1 puff into the lungs daily. 10/17/17   Flora Lipps, MD    REVIEW OF SYSTEMS:  Review of Systems  Constitutional: Negative for chills, fever, malaise/fatigue and weight loss.  HENT: Negative for ear pain, hearing loss and tinnitus.  Swelling of her left cheek  Eyes: Negative for blurred vision, double vision, pain and redness.  Respiratory: Negative for  cough, hemoptysis and shortness of breath.   Cardiovascular: Negative for chest pain, palpitations, orthopnea and leg swelling.  Gastrointestinal: Negative for abdominal pain, constipation, diarrhea, nausea and vomiting.  Genitourinary: Negative for dysuria, frequency and hematuria.  Musculoskeletal: Negative for back pain, joint pain and neck pain.  Skin:       No acne, rash, or lesions  Neurological: Negative for dizziness, tremors, focal weakness and weakness.  Endo/Heme/Allergies: Negative for polydipsia. Does not bruise/bleed easily.  Psychiatric/Behavioral: Negative for depression. The patient is not nervous/anxious and does not have insomnia.      VITAL SIGNS:   Vitals:   01/07/19 2205 01/07/19 2206 01/07/19 2230 01/07/19 2330  BP: (!) 129/107  (!) 146/75 135/76  Pulse: 89  87 79  Resp: 18     Temp: 98.7 F (37.1 C)     TempSrc: Oral     SpO2: 98%  97% 95%  Weight:  90.7 kg    Height:  5\' 6"  (1.676 m)     Wt Readings from Last 3 Encounters:  01/07/19 90.7 kg  08/13/18 90.7 kg  04/18/18 88.4 kg    PHYSICAL EXAMINATION:  Physical Exam  Vitals reviewed. Constitutional: She is oriented to person, place, and time. She appears well-developed and well-nourished. No distress.  HENT:  Head: Normocephalic and atraumatic.  Mouth/Throat: Oropharynx is clear and moist.  Swelling over her left side of her face  Eyes: Pupils are equal, round, and reactive to light. Conjunctivae and EOM are normal. No scleral icterus.  Neck: Normal range of motion. Neck supple. No JVD present. No thyromegaly present.  Cardiovascular: Normal rate, regular rhythm and intact distal pulses. Exam reveals no gallop and no friction rub.  No murmur heard. Respiratory: Effort normal and breath sounds normal. No respiratory distress. She has no wheezes. She has no rales.  GI: Soft. Bowel sounds are normal. She exhibits no distension. There is no abdominal tenderness.  Musculoskeletal: Normal range of  motion.        General: No edema.     Comments: No arthritis, no gout  Lymphadenopathy:    She has no cervical adenopathy.  Neurological: She is alert and oriented to person, place, and time. No cranial nerve deficit.  No dysarthria, no aphasia  Skin: Skin is warm and dry. No rash noted. No erythema.  Psychiatric: She has a normal mood and affect. Her behavior is normal. Judgment and thought content normal.    LABORATORY PANEL:   CBC Recent Labs  Lab 01/07/19 2306  WBC 9.8  HGB 7.6*  HCT 26.3*  PLT 172   ------------------------------------------------------------------------------------------------------------------  Chemistries  Recent Labs  Lab 01/07/19 2306  NA 136  K 4.9  CL 105  CO2 22  GLUCOSE 133*  BUN 17  CREATININE 0.98  CALCIUM 8.3*   ------------------------------------------------------------------------------------------------------------------  Cardiac Enzymes No results for input(s): TROPONINI in the last 168 hours. ------------------------------------------------------------------------------------------------------------------  RADIOLOGY:  Ct Soft Tissue Neck Wo Contrast  Result Date: 01/07/2019 CLINICAL DATA:  Recurrent parotiditis. Left neck swelling. Difficulty swallowing. EXAM: CT NECK WITHOUT CONTRAST TECHNIQUE: Multidetector CT imaging of the neck was performed following the standard protocol without intravenous contrast. COMPARISON:  None. FINDINGS: Pharynx and larynx: Normal. No mass or swelling. Salivary glands: There is mild enlargement of the left submandibular gland with moderate surrounding subcutaneous fat stranding and thickening of the platysma. No sialadenitis. There is slight  asymmetry of the parotid glands, but the small amount of inflammation adjacent to the left parotid gland is likely extending from the submandibular gland. Thyroid: Status post thyroidectomy Lymph nodes: Multiple subcentimeter left cervical lymph nodes. None  enlarged or abnormal density. Vascular: Partial retropharyngeal course of both carotids. Mild aortic atherosclerosis. Limited intracranial: Normal Visualized orbits: Normal Mastoids and visualized paranasal sinuses: Clear Skeleton: Multilevel facet arthrosis. No bony spinal canal stenosis. Upper chest: Clear Other: None IMPRESSION: Enlargement of the left submandibular gland with moderate surrounding inflammatory stranding, likely indicating submandibular sialadenitis. Slight stranding adjacent to the left parotid gland is favored to be an extension of the left submandibular process. Aortic atherosclerosis (ICD10-I70.0). Electronically Signed   By: Ulyses Jarred M.D.   On: 01/07/2019 23:02    EKG:   Orders placed or performed in visit on 03/05/18  . EKG 12-Lead    IMPRESSION AND PLAN:  Principal Problem:   Sialadenitis -IV Unasyn, Decadron given in the ED.  ENT on board as contacted by ED physician Active Problems:   Insulin dependent diabetes mellitus ([place]) -sliding scale insulin coverage   Chronic obstructive pulmonary disease ([place]) -continue home dose inhalers   Coronary Artery Disease s/p CABG 10/2013 -continue home medications   Essential hypertension -home dose antihypertensives   Depression with anxiety -home dose medications  Chart review performed and case discussed with ED provider. Labs, imaging and/or ECG reviewed by provider and discussed with patient/family. Management plans discussed with the patient and/or family.  COVID-19 status: Pending  DVT PROPHYLAXIS: SubQ lovenox   GI PROPHYLAXIS:  None  ADMISSION STATUS: Observation  CODE STATUS: Full Code Status History    Date Active Date Inactive Code Status Order ID Comments User Context   12/28/2017 1429 12/29/2017 1918 Full Code [id number]  Demetrios Loll, MD ED   09/19/2017 1551 09/20/2017 2059 Full Code [id number]  Almyra Deforest, [facility]   08/16/2016 1059 08/24/2016 1905 Full Code [id number]  Nicholes Mango, MD Inpatient    05/27/2016 0239 05/27/2016 1955 Full Code [id number]  Carmon Ginsberg, MD Inpatient   05/25/2016 0023 05/26/2016 1532 Full Code [id number]  Jay Schlichter, MD Inpatient   07/24/2014 0010 07/24/2014 1945 Full Code [id number]  Manus Gunning, MD Inpatient   10/12/2013 2032 10/15/2013 1936 Full Code [id number]  John Giovanni, PA-C Inpatient   10/09/2013 1930 10/12/2013 2032 Full Code [id number]  [name], MD Inpatient   10/08/2013 2312 10/09/2013 1930 Full Code [id number]  Stephani Police, MD ED   Advance Care Planning Activity      TOTAL TIME TAKING CARE OF THIS PATIENT: 40 minutes.   This patient was evaluated in the context of the global COVID-19 pandemic, which necessitated consideration that the patient might be at risk for infection with the SARS-CoV-2 virus that causes COVID-19. Institutional protocols and algorithms that pertain to the evaluation of patients at risk for COVID-19 are in a state of rapid change based on information released by regulatory bodies including the CDC and federal and state organizations. These policies and algorithms were followed to the best of this provider's knowledge to date during the patient's care at this facility.  Ethlyn Daniels 01/08/2019, 12:22 AM  [facility]  Office  [phone]  CC: Primary care physician; Glendon Axe, MD  Note:  This document was prepared using Dragon voice recognition software and may include unintentional dictation errors.

## 2019-01-08 NOTE — ED Notes (Signed)
ED TO INPATIENT HANDOFF REPORT  ED Nurse Name and Phone #: [phone]  S Name/Age/Gender [name] 73 y.o. female Room/Bed: ED04A/ED04A  Code Status   Code Status: Prior  Home/SNF/Other Home Patient oriented to: self, place, time and situation Is this baseline? Yes   Triage Complete: Triage complete  Chief Complaint trouble swallowing  Triage Note Pt to triage via w/c with no distress noted, mask in place; pt reports parotid gland swelling; rx ampicillin (2 doses taken); having increased swelling with difficulty swallowing   Allergies Allergies  Allergen Reactions  . Amitriptyline Other (See Comments)    Unknown reaction  . Benadryl [Diphenhydramine] Shortness Of Breath  . Demerol [Meperidine] Other (See Comments)    Unknown reaction  . Gabapentin Other (See Comments)    Unknown reaction  . Loratadine Other (See Comments)    Unknown reaction  . Meperidine Hcl Other (See Comments)    Unknown reaction  . Mirtazapine Other (See Comments)    Unknown reaction  . Olanzapine Other (See Comments)    Unknown reaction   . Voltaren [Diclofenac Sodium] Shortness Of Breath  . Zetia [Ezetimibe] Other (See Comments)    Weakness in legs, shakiness all over  . Ativan [Lorazepam] Other (See Comments)    Causes double vision at highter than .5 mg dose  . Atorvastatin Other (See Comments)    Muscle aches and weakness  . Budesonide-Formoterol Fumarate Other (See Comments)    Shakiness, tremors  . Bupropion Hcl Other (See Comments)    "cloud over me" depression  . Caffeine Other (See Comments)    jitters  . Codeine Sulfate Other (See Comments)    Makes chest hurt like a heart attack  . Lisinopril Cough  . Metformin Nausea And Vomiting  . Mometasone Furoate Nausea And Vomiting  . Morphine Sulfate Other (See Comments)    Chest pain like a heart attack  . Other Other (See Comments)    Beta Blockers, reaction shortness of breath  . Oxycodone-Acetaminophen Nausea And Vomiting   . Pioglitazone Other (See Comments)    Cannot take because of risk of bladder cancer  . Propoxyphene N-Acetaminophen Nausea And Vomiting  . Rosuvastatin Other (See Comments)    Muscle aches and weakness  . Shellfish Allergy Diarrhea  . Suvorexant Other (See Comments)    Jerking/nervous   . Ticagrelor     Other reaction(s): Other (See Comments) "slowed heart rate" & chest pain  . Tramadol Nausea Only  . Trazodone And Nefazodone Nausea And Vomiting  . Venlafaxine Other (See Comments)    Unknown reaction  . Zolpidem Tartrate Other (See Comments)     Jittery, diarrhea  . Latex Rash    Level of Care/Admitting Diagnosis ED Disposition    ED Disposition Condition [facility]: [place] [[id number]]  Level of Care: Med-Surg [16]  Covid Evaluation: Asymptomatic Screening Protocol (No Symptoms)  Diagnosis: Sialadenitis [481856]  Admitting Physician: Lance Coon [[id number]]  Attending Physician: Lance Coon [[id number]]  PT Class (Do Not Modify): Observation [104]  PT Acc Code (Do Not Modify): Observation [10022]       B Medical/Surgery History Past Medical History:  Diagnosis Date  . 1st degree AV block   . ACE-inhibitor cough   . Allergic rhinitis   . Anemia    iron deficiency anemia  . Anxiety   . Aortic ectasia (HCC)    a. CT abd in 12/2016 incidentally noted aortic atherosclerosis and infrarenal abdominal aortic ectasia  measuring as large as 2.7 cm with recommendation to repeat US in 2023.  Marland Kitchen Arthritis   . Asthma   . Cataract   . Chronic depression   . Chronic diastolic CHF (congestive heart failure) ([place])   . Chronic headache   . COPD (chronic obstructive pulmonary disease) ([place])   . Coronary artery disease    a. DES to RCA and mid Cx 2009. b. CABG and bioprosthetic AVR May 2015. c. cutting balloon to prox Cx in 05/2016  . Diabetes mellitus    type 2  . Diverticulitis of colon   . Essential hypertension   . GERD  (gastroesophageal reflux disease)   . Hearing loss   . History of blood transfusion 2013  . History of prosthetic aortic valve replacement   . HOH (hard of hearing)   . Hypercholesterolemia    intolerance of statins and niaspan  . Mobitz type 1 second degree AV block   . OSA (obstructive sleep apnea)    mild, intolerant of cpap  . PAD (peripheral artery disease) ([place])    a. atherosclerosis by CT abd 12/2016 in [place].  Marland Kitchen PONV (postoperative nausea and vomiting)   . Statin intolerance   . Thyroid disease    Past Surgical History:  Procedure Laterality Date  . ABDOMINAL HYSTERECTOMY    . ABDOMINAL HYSTERECTOMY W/ PARTIAL VAGINACTOMY    . AORTIC VALVE REPLACEMENT N/A 10/12/2013   Procedure: AORTIC VALVE REPLACEMENT (AVR);  Surgeon: [name], MD;  Location: [facility];  Service: Open Heart Surgery;  Laterality: N/A;  . [place]  . BARTHOLIN GLAND CYST EXCISION    . BLADDER SUSPENSION    . BREAST BIOPSY Bilateral 09/11/2000   neg  . BREAST BIOPSY Left 07/24/2010   neg  . BREAST CYST EXCISION  1988   bilateral nonmalignant tumors, x3  . CARDIAC CATHETERIZATION    . CARDIAC CATHETERIZATION N/A 05/25/2016   Procedure: Coronary Balloon Angioplasty;  Surgeon: Leonie Man, MD;  Location: [place] [facility];  Service: Cardiovascular;  Laterality: N/A;  . CARDIAC CATHETERIZATION N/A 05/25/2016   Procedure: Coronary/Graft Angiography;  Surgeon: Leonie Man, MD;  Location: [facility];  Service: Cardiovascular;  Laterality: N/A;  . CATARACT EXTRACTION W/ INTRAOCULAR LENS  IMPLANT, BILATERAL    . CHOLECYSTECTOMY  2001  . COLECTOMY     lap sigmoid  . COLONOSCOPY  2014   polyps found, 2 clamped off.  . CORONARY ANGIOPLASTY  10/29/2007   Prox RCA & Mid Cx.  . CORONARY ARTERY BYPASS GRAFT N/A 10/12/2013   Procedure: CORONARY ARTERY BYPASS GRAFT TIMES TWO;  Surgeon: [name], MD;  Location: [facility];  Service: Open Heart Surgery;  Laterality: N/A;  . CORONARY/GRAFT  ANGIOGRAPHY N/A 09/20/2017   Procedure: CORONARY/GRAFT ANGIOGRAPHY;  Surgeon: Sherren Mocha, MD;  Location: [place] [facility];  Service: Cardiovascular;  Laterality: N/A;  . LEFT HEART CATHETERIZATION WITH CORONARY ANGIOGRAM N/A 10/09/2013   Procedure: LEFT HEART CATHETERIZATION WITH CORONARY ANGIOGRAM;  Surgeon: [name], MD;  Location: [facility];  Service: Cardiovascular;  Laterality: N/A;  . STERNAL WIRES REMOVAL N/A 04/13/2014   Procedure: STERNAL WIRES REMOVAL;  Surgeon: [name], MD;  Location: [facility];  Service: Thoracic;  Laterality: N/A;  . THYROIDECTOMY    . TONSILLECTOMY    . TUBAL LIGATION    . VAGINAL DELIVERY     3  . VISCERAL ARTERY INTERVENTION N/A 08/16/2016   Procedure: Visceral Artery Intervention;  Surgeon: Algernon Huxley, MD;  Location: [place];  Service: Cardiovascular;  Laterality: N/A;     A IV Location/Drains/Wounds Patient Lines/Drains/Airways Status   Active Line/Drains/Airways    Name:   Placement date:   Placement time:   Site:   Days:   Peripheral IV 01/07/19 Left Antecubital   01/07/19    2300    Antecubital   1   Sheath 08/16/16 Right Arterial;Femoral   08/16/16    1729    Arterial;Femoral   875          Intake/Output Last 24 hours  Intake/Output Summary (Last 24 hours) at 01/08/2019 0056 Last data filed at 01/08/2019 0055 Gross per 24 hour  Intake 100 ml  Output -  Net 100 ml    Labs/Imaging Results for orders placed or performed during the hospital encounter of 01/07/19 (from the past 48 hour(s))  CBC with Differential     Status: Abnormal   Collection Time: 01/07/19 11:06 PM  Result Value Ref Range   WBC 9.8 4.0 - 10.5 K/uL   RBC 3.63 (L) 3.87 - 5.11 MIL/uL   Hemoglobin 7.6 (L) 12.0 - 15.0 g/dL    Comment: Reticulocyte Hemoglobin testing may be clinically indicated, consider ordering this additional test [id number]    HCT 26.3 (L) 36.0 - 46.0 %   MCV 72.5 (L) 80.0 - 100.0 fL   MCH 20.9 (L) 26.0 - 34.0 pg    MCHC 28.9 (L) 30.0 - 36.0 g/dL   RDW 17.9 (H) 11.5 - 15.5 %   Platelets 172 150 - 400 K/uL   nRBC 0.0 0.0 - 0.2 %   Neutrophils Relative % 69 %   Neutro Abs 6.9 1.7 - 7.7 K/uL   Lymphocytes Relative 18 %   Lymphs Abs 1.7 0.7 - 4.0 K/uL   Monocytes Relative 9 %   Monocytes Absolute 0.8 0.1 - 1.0 K/uL   Eosinophils Relative 2 %   Eosinophils Absolute 0.2 0.0 - 0.5 K/uL   Basophils Relative 1 %   Basophils Absolute 0.1 0.0 - 0.1 K/uL   Immature Granulocytes 1 %   Abs Immature Granulocytes 0.06 0.00 - 0.07 K/uL    Comment: Performed at [facility], [address] [place], [place] [address]  Basic metabolic panel     Status: Abnormal   Collection Time: 01/07/19 11:06 PM  Result Value Ref Range   Sodium 136 135 - 145 mmol/L   Potassium 4.9 3.5 - 5.1 mmol/L   Chloride 105 98 - 111 mmol/L   CO2 22 22 - 32 mmol/L   Glucose, Bld 133 (H) 70 - 99 mg/dL   BUN 17 8 - 23 mg/dL   Creatinine, Ser 0.98 0.44 - 1.00 mg/dL   Calcium 8.3 (L) 8.9 - 10.3 mg/dL   GFR calc non Af Amer 57 (L) >60 mL/min   GFR calc Af Amer >60 >60 mL/min   Anion gap 9 5 - 15    Comment: Performed at [facility], [address] [place], [place] [address]  SARS Coronavirus 2 [facility] order, Performed in [facility] hospital lab) Nasopharyngeal Nasopharyngeal Swab     Status: None   Collection Time: 01/07/19 11:06 PM   Specimen: Nasopharyngeal Swab  Result Value Ref Range   SARS Coronavirus 2 NEGATIVE NEGATIVE    Comment: (NOTE) If result is NEGATIVE SARS-CoV-2 target nucleic acids are NOT DETECTED. The SARS-CoV-2 RNA is generally detectable in upper and lower  respiratory specimens during the  acute phase of infection. The lowest  concentration of SARS-CoV-2 viral copies this assay can detect is 250  copies / mL. A negative result does not preclude SARS-CoV-2 infection  and should not be used as the sole basis for treatment or other  patient management decisions.  A negative result may occur  with  improper specimen collection / handling, submission of specimen other  than nasopharyngeal swab, presence of viral mutation(s) within the  areas targeted by this assay, and inadequate number of viral copies  (<250 copies / mL). A negative result must be combined with clinical  observations, patient history, and epidemiological information. If result is POSITIVE SARS-CoV-2 target nucleic acids are DETECTED. The SARS-CoV-2 RNA is generally detectable in upper and lower  respiratory specimens dur ing the acute phase of infection.  Positive  results are indicative of active infection with SARS-CoV-2.  Clinical  correlation with patient history and other diagnostic information is  necessary to determine patient infection status.  Positive results do  not rule out bacterial infection or co-infection with other viruses. If result is PRESUMPTIVE POSTIVE SARS-CoV-2 nucleic acids MAY BE PRESENT.   A presumptive positive result was obtained on the submitted specimen  and confirmed on repeat testing.  While 2019 novel coronavirus  (SARS-CoV-2) nucleic acids may be present in the submitted sample  additional confirmatory testing may be necessary for epidemiological  and / or clinical management purposes  to differentiate between  SARS-CoV-2 and other Sarbecovirus currently known to infect humans.  If clinically indicated additional testing with an alternate test  methodology [phone]) is advised. The SARS-CoV-2 RNA is generally  detectable in upper and lower respiratory sp ecimens during the acute  phase of infection. The expected result is Negative. Fact Sheet for Patients:  StrictlyIdeas.no Fact Sheet for Healthcare Providers: BankingDealers.co.za This test is not yet approved or cleared by the [place] FDA and has been authorized for detection and/or diagnosis of SARS-CoV-2 by FDA under an Emergency Use Authorization (EUA).  This EUA  will remain in effect (meaning this test can be used) for the duration of the COVID-19 declaration under Section 564(b)(1) of the Act, 21 U.S.C. section 360bbb-3(b)(1), unless the authorization is terminated or revoked sooner. Performed at [facility], [address], [place], [place] [address]    Ct Soft Tissue Neck Wo Contrast  Result Date: 01/07/2019 CLINICAL DATA:  Recurrent parotiditis. Left neck swelling. Difficulty swallowing. EXAM: CT NECK WITHOUT CONTRAST TECHNIQUE: Multidetector CT imaging of the neck was performed following the standard protocol without intravenous contrast. COMPARISON:  None. FINDINGS: Pharynx and larynx: Normal. No mass or swelling. Salivary glands: There is mild enlargement of the left submandibular gland with moderate surrounding subcutaneous fat stranding and thickening of the platysma. No sialadenitis. There is slight asymmetry of the parotid glands, but the small amount of inflammation adjacent to the left parotid gland is likely extending from the submandibular gland. Thyroid: Status post thyroidectomy Lymph nodes: Multiple subcentimeter left cervical lymph nodes. None enlarged or abnormal density. Vascular: Partial retropharyngeal course of both carotids. Mild aortic atherosclerosis. Limited intracranial: Normal Visualized orbits: Normal Mastoids and visualized paranasal sinuses: Clear Skeleton: Multilevel facet arthrosis. No bony spinal canal stenosis. Upper chest: Clear Other: None IMPRESSION: Enlargement of the left submandibular gland with moderate surrounding inflammatory stranding, likely indicating submandibular sialadenitis. Slight stranding adjacent to the left parotid gland is favored to be an extension of the left submandibular process. Aortic atherosclerosis (ICD10-I70.0). Electronically Signed   By: Cletus Gash.D.  On: 01/07/2019 23:02    Pending Labs Unresulted Labs (From admission, onward)    Start     Ordered   Signed and Held  CBC   (enoxaparin (LOVENOX)    CrCl >/= 30 ml/min)  Once,   R    Comments: Baseline for enoxaparin therapy IF NOT ALREADY DRAWN.  Notify MD if PLT < 100 K.    Signed and Held   Signed and Held  Creatinine, serum  (enoxaparin (LOVENOX)    CrCl >/= 30 ml/min)  Once,   R    Comments: Baseline for enoxaparin therapy IF NOT ALREADY DRAWN.    Signed and Held   Signed and Held  Creatinine, serum  (enoxaparin (LOVENOX)    CrCl >/= 30 ml/min)  Weekly,   R    Comments: while on enoxaparin therapy    Signed and Held          Vitals/Pain Today's Vitals   01/07/19 2205 01/07/19 2206 01/07/19 2230 01/07/19 2330  BP: (!) 129/107  (!) 146/75 135/76  Pulse: 89  87 79  Resp: 18     Temp: 98.7 F (37.1 C)     TempSrc: Oral     SpO2: 98%  97% 95%  Weight:  90.7 kg    Height:  5\' 6"  (1.676 m)    PainSc:  2       Isolation Precautions No active isolations  Medications Medications  Ampicillin-Sulbactam (UNASYN) 3 g in sodium chloride 0.9 % 100 mL IVPB (0 g Intravenous Stopped 01/08/19 0055)  dexamethasone (DECADRON) injection 10 mg (10 mg Intravenous Given 01/08/19 0009)    Mobility walks Low fall risk   Focused Assessments Cardiac Assessment Handoff:    Lab Results  Component Value Date   CKTOTAL 101 05/26/2016   CKMB 8.6 (H) 05/26/2016   TROPONINI <0.03 03/04/2018   Lab Results  Component Value Date   DDIMER 2.54 (H) 12/03/2013   Does the Patient currently have chest pain? No     R Recommendations: See Admitting Provider Note  Report given to:   Additional Notes:

## 2019-01-08 NOTE — Discharge Summary (Signed)
[facility] [place] NAME: [name]    MR#:  [id number]  DATE OF BIRTH:  1945-08-14  DATE OF ADMISSION:  01/07/2019 ADMITTING PHYSICIAN: Lance Coon, MD  DATE OF DISCHARGE: 01/08/2019  PRIMARY CARE PHYSICIAN: Glendon Axe, MD    ADMISSION DIAGNOSIS:  Sialoadenitis [K11.20] Submandibular gland swelling [R60.9]  DISCHARGE DIAGNOSIS:  Principal Problem:   Sialadenitis Active Problems:   Acquired hypothyroidism   Insulin dependent diabetes mellitus ([place])   Essential hypertension   Chronic obstructive pulmonary disease ([place])   Depression with anxiety   Gastroesophageal reflux disease without esophagitis   Coronary Artery Disease s/p CABG 10/2013   SECONDARY DIAGNOSIS:   Past Medical History:  Diagnosis Date  . 1st degree AV block   . ACE-inhibitor cough   . Allergic rhinitis   . Anemia    iron deficiency anemia  . Anxiety   . Aortic ectasia (HCC)    a. CT abd in 12/2016 incidentally noted aortic atherosclerosis and infrarenal abdominal aortic ectasia measuring as large as 2.7 cm with recommendation to repeat US in 2023.  Marland Kitchen Arthritis   . Asthma   . Cataract   . Chronic depression   . Chronic diastolic CHF (congestive heart failure) ([place])   . Chronic headache   . COPD (chronic obstructive pulmonary disease) ([place])   . Coronary artery disease    a. DES to RCA and mid Cx 2009. b. CABG and bioprosthetic AVR May 2015. c. cutting balloon to prox Cx in 05/2016  . Diabetes mellitus    type 2  . Diverticulitis of colon   . Essential hypertension   . GERD (gastroesophageal reflux disease)   . Hearing loss   . History of blood transfusion 2013  . History of prosthetic aortic valve replacement   . HOH (hard of hearing)   . Hypercholesterolemia    intolerance of statins and niaspan  . Mobitz type 1 second degree AV block   . OSA (obstructive sleep apnea)    mild, intolerant of cpap  . PAD (peripheral artery disease) ([place])    a.  atherosclerosis by CT abd 12/2016 in [place].  Marland Kitchen PONV (postoperative nausea and vomiting)   . Statin intolerance   . Thyroid disease     HOSPITAL COURSE:   1.  Parotitis and submandibular gland inflammation Patient received IV Decadron in the emergency room and Unasyn.  The patient did not want to stay in the hospital any longer.  Give p.o. prednisone.  Careful with the dosing because of high sugars.  Finish up Augmentin that was prescribed yesterday.  Patient has Sjogren's syndrome so already has dry mouth.  Advised using some low sugar sucking candies.  Patient able to eat normal diet. 2.  Insulin-dependent diabetes mellitus with sugars in the 400s after IV steroids.  Patient on high-dose 70/30 insulin.  Careful with steroids secondary to elevated sugars.  We will give low dose prednisone taper. 3.  Iron deficiency anemia.  Give IV Venofer.  Stop blood thinners.  Follow-up with GI and oncology as outpatient.  Hopefully can get iron infusions through the oncology department.  Patient declines blood transfusion and GI work-up here in the hospital.  Hemoglobin 7.6.  Ferritin low at 15. 4.  Depression anxiety on Abilify and tapering dose of Effexor and on Celexa 5.  Hypothyroidism unspecified on levothyroxine 6.  Obesity with a BMI of 32.85 7.  History of CAD.  Hold aspirin with iron deficiency anemia patient also  on Ranexa  Again patient did not want to stay in the hospital any longer.  Patient was discharged home with follow-up as outpatient.   DISCHARGE CONDITIONS:   Fair  CONSULTS OBTAINED:  ENT consult ordered but patient wanted to go home.  DRUG ALLERGIES:   Allergies  Allergen Reactions  . Amitriptyline Other (See Comments)    Unknown reaction  . Benadryl [Diphenhydramine] Shortness Of Breath  . Demerol [Meperidine] Other (See Comments)    Unknown reaction  . Gabapentin Other (See Comments)    Unknown reaction  . Loratadine Other (See Comments)    Unknown reaction  . Meperidine  Hcl Other (See Comments)    Unknown reaction  . Mirtazapine Other (See Comments)    Unknown reaction  . Olanzapine Other (See Comments)    Unknown reaction   . Voltaren [Diclofenac Sodium] Shortness Of Breath  . Zetia [Ezetimibe] Other (See Comments)    Weakness in legs, shakiness all over  . Ativan [Lorazepam] Other (See Comments)    Causes double vision at highter than .5 mg dose  . Atorvastatin Other (See Comments)    Muscle aches and weakness  . Budesonide-Formoterol Fumarate Other (See Comments)    Shakiness, tremors  . Bupropion Hcl Other (See Comments)    "cloud over me" depression  . Caffeine Other (See Comments)    jitters  . Codeine Sulfate Other (See Comments)    Makes chest hurt like a heart attack  . Lisinopril Cough  . Metformin Nausea And Vomiting  . Mometasone Furoate Nausea And Vomiting  . Morphine Sulfate Other (See Comments)    Chest pain like a heart attack  . Other Other (See Comments)    Beta Blockers, reaction shortness of breath  . Oxycodone-Acetaminophen Nausea And Vomiting  . Pioglitazone Other (See Comments)    Cannot take because of risk of bladder cancer  . Propoxyphene N-Acetaminophen Nausea And Vomiting  . Rosuvastatin Other (See Comments)    Muscle aches and weakness  . Shellfish Allergy Diarrhea  . Suvorexant Other (See Comments)    Jerking/nervous   . Ticagrelor     Other reaction(s): Other (See Comments) "slowed heart rate" & chest pain  . Tramadol Nausea Only  . Trazodone And Nefazodone Nausea And Vomiting  . Venlafaxine Other (See Comments)    Unknown reaction  . Zolpidem Tartrate Other (See Comments)     Jittery, diarrhea  . Latex Rash    DISCHARGE MEDICATIONS:   Allergies as of 01/08/2019      Reactions   Amitriptyline Other (See Comments)   Unknown reaction   Benadryl [diphenhydramine] Shortness Of Breath   Demerol [meperidine] Other (See Comments)   Unknown reaction   Gabapentin Other (See Comments)   Unknown reaction    Loratadine Other (See Comments)   Unknown reaction   Meperidine Hcl Other (See Comments)   Unknown reaction   Mirtazapine Other (See Comments)   Unknown reaction   Olanzapine Other (See Comments)   Unknown reaction   Voltaren [diclofenac Sodium] Shortness Of Breath   Zetia [ezetimibe] Other (See Comments)   Weakness in legs, shakiness all over   Ativan [lorazepam] Other (See Comments)   Causes double vision at highter than .5 mg dose   Atorvastatin Other (See Comments)   Muscle aches and weakness   Budesonide-formoterol Fumarate Other (See Comments)   Shakiness, tremors   Bupropion Hcl Other (See Comments)   "cloud over me" depression   Caffeine Other (See Comments)   jitters  Codeine Sulfate Other (See Comments)   Makes chest hurt like a heart attack   Lisinopril Cough   Metformin Nausea And Vomiting   Mometasone Furoate Nausea And Vomiting   Morphine Sulfate Other (See Comments)   Chest pain like a heart attack   Other Other (See Comments)   Beta Blockers, reaction shortness of breath   Oxycodone-acetaminophen Nausea And Vomiting   Pioglitazone Other (See Comments)   Cannot take because of risk of bladder cancer   Propoxyphene N-acetaminophen Nausea And Vomiting   Rosuvastatin Other (See Comments)   Muscle aches and weakness   Shellfish Allergy Diarrhea   Suvorexant Other (See Comments)   Jerking/nervous    Ticagrelor    Other reaction(s): Other (See Comments) "slowed heart rate" & chest pain   Tramadol Nausea Only   Trazodone And Nefazodone Nausea And Vomiting   Venlafaxine Other (See Comments)   Unknown reaction   Zolpidem Tartrate Other (See Comments)    Jittery, diarrhea   Latex Rash      Medication List    STOP taking these medications   umeclidinium bromide 62.5 MCG/INH Aepb Commonly known as: Incruse Ellipta     TAKE these medications   albuterol (2.5 MG/3ML) 0.083% nebulizer solution Commonly known as: PROVENTIL Take 3 mLs (2.5 mg total) by  nebulization every 6 (six) hours as needed for wheezing or shortness of breath.   amoxicillin-clavulanate 875-125 MG tablet Commonly known as: Augmentin Take 1 tablet by mouth 2 (two) times daily for 7 days.   ARIPiprazole 2 MG tablet Commonly known as: ABILIFY Take 2 mg by mouth daily.   budesonide 0.5 MG/2ML nebulizer solution Commonly known as: PULMICORT Take 0.5 mg by nebulization daily as needed (breathing difficulty).   CALTRATE 600 PLUS-VIT D PO Take 1 tablet by mouth 2 (two) times daily.   citalopram 20 MG tablet Commonly known as: CELEXA Take 20 mg by mouth daily.   furosemide 20 MG tablet Commonly known as: LASIX Take 20 mg by mouth daily as needed for fluid or edema.   insulin NPH-regular Human (70-30) 100 UNIT/ML injection Inject 75-95 Units into the skin See admin instructions. Inject 95u under the skin every morning at breakfast and inject 75u under the skin every evening with supper   levothyroxine 125 MCG tablet Commonly known as: SYNTHROID Take 125 mcg by mouth daily.   metFORMIN 1000 MG tablet Commonly known as: GLUCOPHAGE Take 1,000 mg by mouth 2 (two) times daily with a meal.   multivitamin with minerals Tabs tablet Take 1 tablet by mouth daily.   nitroGLYCERIN 0.4 MG SL tablet Commonly known as: NITROSTAT Place 1 tablet (0.4 mg total) under the tongue every 5 (five) minutes as needed for chest pain.   olmesartan 20 MG tablet Commonly known as: BENICAR Take 20 mg by mouth at bedtime.   pantoprazole 20 MG tablet Commonly known as: PROTONIX Take 20 mg by mouth daily.   predniSONE 10 MG tablet Commonly known as: DELTASONE 3 tabs po day1; 2 tabs po day2,3; 1 tab po day4,5; 1/2 tab po day6,7   promethazine 25 MG tablet Commonly known as: PHENERGAN Take 25 mg by mouth every 6 (six) hours as needed for nausea or vomiting.   ranolazine 500 MG 12 hr tablet Commonly known as: Ranexa Take 1 tablet (500 mg total) by mouth 2 (two) times daily.    traZODone 100 MG tablet Commonly known as: DESYREL Take 100 mg by mouth at bedtime as needed for sleep.  venlafaxine XR 75 MG 24 hr capsule Commonly known as: EFFEXOR-XR Take 75 mg by mouth daily.        DISCHARGE INSTRUCTIONS:   Follow-up PMD 5 days Follow-up ENT 1 week Follow-up gastroenterology and oncology as outpatient to follow-up blood counts and further work-up for iron deficiency anemia  If you experience worsening of your admission symptoms, develop shortness of breath, life threatening emergency, suicidal or homicidal thoughts you must seek medical attention immediately by calling 911 or calling your MD immediately  if symptoms less severe.  You Must read complete instructions/literature along with all the possible adverse reactions/side effects for all the Medicines you take and that have been prescribed to you. Take any new Medicines after you have completely understood and accept all the possible adverse reactions/side effects.   Please note  You were cared for by a hospitalist during your hospital stay. If you have any questions about your discharge medications or the care you received while you were in the hospital after you are discharged, you can call the unit and asked to speak with the hospitalist on call if the hospitalist that took care of you is not available. Once you are discharged, your primary care physician will handle any further medical issues. Please note that NO REFILLS for any discharge medications will be authorized once you are discharged, as it is imperative that you return to your primary care physician (or establish a relationship with a primary care physician if you do not have one) for your aftercare needs so that they can reassess your need for medications and monitor your lab values.    Today   CHIEF COMPLAINT:   Chief Complaint  Patient presents with  . Dysphagia    HISTORY OF PRESENT ILLNESS:  [name]  is a 73 y.o. female with  men with trouble swallowing and swollen face   VITAL SIGNS:  Blood pressure (!) 153/78, pulse 79, temperature (!) 97.5 F (36.4 C), temperature source Oral, resp. rate (!) 24, height 5\' 6"  (1.676 m), weight 92.3 kg, SpO2 99 %.  I/O:    Intake/Output Summary (Last 24 hours) at 01/08/2019 1449 Last data filed at 01/08/2019 1352 Gross per 24 hour  Intake 597.71 ml  Output -  Net 597.71 ml    PHYSICAL EXAMINATION:  GENERAL:  73 y.o.-year-old patient lying in the bed with no acute distress.  EYES: Pupils equal, round, reactive to light and accommodation. No scleral icterus. Extraocular muscles intact.  HEENT: Head atraumatic, normocephalic. Oropharynx and nasopharynx clear.  Patient with some pain to palpation on the parotid gland opening in the mouth and the submandibular gland opening in the mouth. NECK:  Supple, no jugular venous distention. No thyroid enlargement, no tenderness.  LUNGS: Normal breath sounds bilaterally, no wheezing, rales,rhonchi or crepitation. No use of accessory muscles of respiration.  CARDIOVASCULAR: S1, S2 normal. No murmurs, rubs, or gallops.  ABDOMEN: Soft, non-tender, non-distended. Bowel sounds present. No organomegaly or mass.  EXTREMITIES: No pedal edema, cyanosis, or clubbing.  NEUROLOGIC: Cranial nerves II through XII are intact. Muscle strength 5/5 in all extremities. Sensation intact. Gait not checked.  PSYCHIATRIC: The patient is alert and oriented x 3.  SKIN: No obvious rash, lesion, or ulcer.   DATA REVIEW:   CBC Recent Labs  Lab 01/07/19 2306  WBC 9.8  HGB 7.6*  HCT 26.3*  PLT 172    Chemistries  Recent Labs  Lab 01/07/19 2306  NA 136  K 4.9  CL 105  CO2 22  GLUCOSE 133*  BUN 17  CREATININE 0.98  CALCIUM 8.3*      Microbiology Results  Results for orders placed or performed during the hospital encounter of 01/07/19  SARS Coronavirus 2 [facility] order, Performed in [facility] hospital lab) Nasopharyngeal Nasopharyngeal Swab      Status: None   Collection Time: 01/07/19 11:06 PM   Specimen: Nasopharyngeal Swab  Result Value Ref Range Status   SARS Coronavirus 2 NEGATIVE NEGATIVE Final    Comment: (NOTE) If result is NEGATIVE SARS-CoV-2 target nucleic acids are NOT DETECTED. The SARS-CoV-2 RNA is generally detectable in upper and lower  respiratory specimens during the acute phase of infection. The lowest  concentration of SARS-CoV-2 viral copies this assay can detect is 250  copies / mL. A negative result does not preclude SARS-CoV-2 infection  and should not be used as the sole basis for treatment or other  patient management decisions.  A negative result may occur with  improper specimen collection / handling, submission of specimen other  than nasopharyngeal swab, presence of viral mutation(s) within the  areas targeted by this assay, and inadequate number of viral copies  (<250 copies / mL). A negative result must be combined with clinical  observations, patient history, and epidemiological information. If result is POSITIVE SARS-CoV-2 target nucleic acids are DETECTED. The SARS-CoV-2 RNA is generally detectable in upper and lower  respiratory specimens dur ing the acute phase of infection.  Positive  results are indicative of active infection with SARS-CoV-2.  Clinical  correlation with patient history and other diagnostic information is  necessary to determine patient infection status.  Positive results do  not rule out bacterial infection or co-infection with other viruses. If result is PRESUMPTIVE POSTIVE SARS-CoV-2 nucleic acids MAY BE PRESENT.   A presumptive positive result was obtained on the submitted specimen  and confirmed on repeat testing.  While 2019 novel coronavirus  (SARS-CoV-2) nucleic acids may be present in the submitted sample  additional confirmatory testing may be necessary for epidemiological  and / or clinical management purposes  to differentiate between  SARS-CoV-2 and  other Sarbecovirus currently known to infect humans.  If clinically indicated additional testing with an alternate test  methodology [phone]) is advised. The SARS-CoV-2 RNA is generally  detectable in upper and lower respiratory sp ecimens during the acute  phase of infection. The expected result is Negative. Fact Sheet for Patients:  StrictlyIdeas.no Fact Sheet for Healthcare Providers: BankingDealers.co.za This test is not yet approved or cleared by the [place] FDA and has been authorized for detection and/or diagnosis of SARS-CoV-2 by FDA under an Emergency Use Authorization (EUA).  This EUA will remain in effect (meaning this test can be used) for the duration of the COVID-19 declaration under Section 564(b)(1) of the Act, 21 U.S.C. section 360bbb-3(b)(1), unless the authorization is terminated or revoked sooner. Performed at [facility], [address] [place], [place] [address]     RADIOLOGY:  Ct Soft Tissue Neck Wo Contrast  Result Date: 01/07/2019 CLINICAL DATA:  Recurrent parotiditis. Left neck swelling. Difficulty swallowing. EXAM: CT NECK WITHOUT CONTRAST TECHNIQUE: Multidetector CT imaging of the neck was performed following the standard protocol without intravenous contrast. COMPARISON:  None. FINDINGS: Pharynx and larynx: Normal. No mass or swelling. Salivary glands: There is mild enlargement of the left submandibular gland with moderate surrounding subcutaneous fat stranding and thickening of the platysma. No sialadenitis. There is slight asymmetry of the parotid glands, but the small amount  of inflammation adjacent to the left parotid gland is likely extending from the submandibular gland. Thyroid: Status post thyroidectomy Lymph nodes: Multiple subcentimeter left cervical lymph nodes. None enlarged or abnormal density. Vascular: Partial retropharyngeal course of both carotids. Mild aortic atherosclerosis.  Limited intracranial: Normal Visualized orbits: Normal Mastoids and visualized paranasal sinuses: Clear Skeleton: Multilevel facet arthrosis. No bony spinal canal stenosis. Upper chest: Clear Other: None IMPRESSION: Enlargement of the left submandibular gland with moderate surrounding inflammatory stranding, likely indicating submandibular sialadenitis. Slight stranding adjacent to the left parotid gland is favored to be an extension of the left submandibular process. Aortic atherosclerosis (ICD10-I70.0). Electronically Signed   By: Ulyses Jarred M.D.   On: 01/07/2019 23:02     Management plans discussed with the patient, family and patient told me she is going home today.  CODE STATUS:     Code Status Orders  (From admission, onward)         Start     Ordered   01/08/19 0217  Full code  Continuous     01/08/19 0216        Code Status History    Date Active Date Inactive Code Status Order ID Comments User Context   12/28/2017 1429 12/29/2017 1918 Full Code [id number]  Demetrios Loll, MD ED   09/19/2017 1551 09/20/2017 2059 Full Code [id number]  Almyra Deforest, [place] Inpatient   08/16/2016 1059 08/24/2016 1905 Full Code [id number]  Nicholes Mango, MD Inpatient   05/27/2016 0239 05/27/2016 1955 Full Code [id number]  Carmon Ginsberg, MD Inpatient   05/25/2016 0023 05/26/2016 1532 Full Code [id number]  Jay Schlichter, MD Inpatient   07/24/2014 0010 07/24/2014 1945 Full Code [id number]  Manus Gunning, MD Inpatient   10/12/2013 2032 10/15/2013 1936 Full Code [id number]  John Giovanni, PA-C Inpatient   10/09/2013 1930 10/12/2013 2032 Full Code [id number]  [name], MD Inpatient   10/08/2013 2312 10/09/2013 1930 Full Code [id number]  Stephani Police, MD ED   Advance Care Planning Activity      TOTAL TIME TAKING CARE OF THIS PATIENT: 35 minutes.    Loletha Grayer M.D on 01/08/2019 at 2:49 PM  Between 7am to 6pm - Pager - [phone]  After 6pm go to [email or web address] - password [email or web address] [facility]   [phone]  CC: Primary care physician; Glendon Axe, MD

## 2019-01-08 NOTE — Progress Notes (Signed)
   01/08/19 1300  Clinical Encounter Type  Visited With Patient and family together  Visit Type Initial  Referral From Nurse  Spiritual Encounters  Spiritual Needs Other (Comment) (Advance Directives Education)  Ch received an OR for AD education. Ch gave the education to the pt and the family member and left the document for the pt to review.

## 2019-01-08 NOTE — Discharge Instructions (Signed)
Stop aspirin.

## 2019-01-17 ENCOUNTER — Emergency Department
Admission: EM | Admit: 2019-01-17 | Discharge: 2019-01-17 | Disposition: A | Discharge status: Home / Self Care | Payer: Medicare HMO | Attending: Emergency Medicine | Admitting: Emergency Medicine

## 2019-01-17 ENCOUNTER — Other Ambulatory Visit: Payer: Self-pay

## 2019-01-17 ENCOUNTER — Emergency Department: Payer: Medicare HMO

## 2019-01-17 DIAGNOSIS — Z794 Long term (current) use of insulin: Secondary | ICD-10-CM | POA: Insufficient documentation

## 2019-01-17 DIAGNOSIS — Z951 Presence of aortocoronary bypass graft: Secondary | ICD-10-CM | POA: Diagnosis not present

## 2019-01-17 DIAGNOSIS — J449 Chronic obstructive pulmonary disease, unspecified: Secondary | ICD-10-CM | POA: Diagnosis not present

## 2019-01-17 DIAGNOSIS — I5032 Chronic diastolic (congestive) heart failure: Secondary | ICD-10-CM | POA: Insufficient documentation

## 2019-01-17 DIAGNOSIS — Z20828 Contact with and (suspected) exposure to other viral communicable diseases: Secondary | ICD-10-CM | POA: Diagnosis not present

## 2019-01-17 DIAGNOSIS — E782 Mixed hyperlipidemia: Secondary | ICD-10-CM | POA: Insufficient documentation

## 2019-01-17 DIAGNOSIS — Z87891 Personal history of nicotine dependence: Secondary | ICD-10-CM | POA: Insufficient documentation

## 2019-01-17 DIAGNOSIS — E119 Type 2 diabetes mellitus without complications: Secondary | ICD-10-CM | POA: Insufficient documentation

## 2019-01-17 DIAGNOSIS — R531 Weakness: Secondary | ICD-10-CM | POA: Diagnosis present

## 2019-01-17 DIAGNOSIS — I110 Hypertensive heart disease with heart failure: Secondary | ICD-10-CM | POA: Insufficient documentation

## 2019-01-17 DIAGNOSIS — Z79899 Other long term (current) drug therapy: Secondary | ICD-10-CM | POA: Diagnosis not present

## 2019-01-17 DIAGNOSIS — R509 Fever, unspecified: Secondary | ICD-10-CM

## 2019-01-17 DIAGNOSIS — I2510 Atherosclerotic heart disease of native coronary artery without angina pectoris: Secondary | ICD-10-CM | POA: Insufficient documentation

## 2019-01-17 DIAGNOSIS — Z952 Presence of prosthetic heart valve: Secondary | ICD-10-CM | POA: Insufficient documentation

## 2019-01-17 DIAGNOSIS — I252 Old myocardial infarction: Secondary | ICD-10-CM | POA: Insufficient documentation

## 2019-01-17 LAB — URINALYSIS, COMPLETE (UACMP) WITH MICROSCOPIC
Bacteria, UA: NONE SEEN
Bilirubin Urine: NEGATIVE
Glucose, UA: 50 mg/dL — AB
Hgb urine dipstick: NEGATIVE
Ketones, ur: NEGATIVE mg/dL
Nitrite: NEGATIVE
Protein, ur: NEGATIVE mg/dL
Specific Gravity, Urine: 1.015 (ref 1.005–1.030)
pH: 5 (ref 5.0–8.0)

## 2019-01-17 LAB — CBC
HCT: 29.9 % — ABNORMAL LOW (ref 36.0–46.0)
Hemoglobin: 8.5 g/dL — ABNORMAL LOW (ref 12.0–15.0)
MCH: 21.4 pg — ABNORMAL LOW (ref 26.0–34.0)
MCHC: 28.4 g/dL — ABNORMAL LOW (ref 30.0–36.0)
MCV: 75.3 fL — ABNORMAL LOW (ref 80.0–100.0)
Platelets: 186 10*3/uL (ref 150–400)
RBC: 3.97 MIL/uL (ref 3.87–5.11)
RDW: 20.4 % — ABNORMAL HIGH (ref 11.5–15.5)
WBC: 12.6 10*3/uL — ABNORMAL HIGH (ref 4.0–10.5)
nRBC: 0 % (ref 0.0–0.2)

## 2019-01-17 LAB — BASIC METABOLIC PANEL
Anion gap: 10 (ref 5–15)
BUN: 14 mg/dL (ref 8–23)
CO2: 22 mmol/L (ref 22–32)
Calcium: 8.4 mg/dL — ABNORMAL LOW (ref 8.9–10.3)
Chloride: 101 mmol/L (ref 98–111)
Creatinine, Ser: 1.07 mg/dL — ABNORMAL HIGH (ref 0.44–1.00)
GFR calc Af Amer: 59 mL/min — ABNORMAL LOW (ref >60)
GFR calc non Af Amer: 51 mL/min — ABNORMAL LOW (ref >60)
Glucose, Bld: 283 mg/dL — ABNORMAL HIGH (ref 70–99)
Potassium: 4.8 mmol/L (ref 3.5–5.1)
Sodium: 133 mmol/L — ABNORMAL LOW (ref 135–145)

## 2019-01-17 LAB — SARS CORONAVIRUS 2 BY RT PCR (HOSPITAL ORDER, PERFORMED IN ~~LOC~~ HOSPITAL LAB): SARS Coronavirus 2: NEGATIVE

## 2019-01-17 MED ORDER — DOXYCYCLINE HYCLATE 100 MG PO CAPS: 100.0000 mg | ORAL_CAPSULE | Freq: Two times a day (BID) | ORAL | 0 refills | Status: DC

## 2019-01-17 MED ORDER — ONDANSETRON 4 MG PO TBDP
4.0000 mg | ORAL_TABLET | Freq: Once | ORAL | Status: AC
  Administered 2019-01-17: 4 mg via ORAL
  Filled 2019-01-17: qty 1

## 2019-01-17 MED ORDER — SODIUM CHLORIDE 0.9 % IV BOLUS
500.0000 mL | Freq: Once | INTRAVENOUS | Status: AC
  Administered 2019-01-17: 17:00:00 500 mL via INTRAVENOUS

## 2019-01-17 MED ORDER — DOXYCYCLINE HYCLATE 100 MG PO TABS
100.0000 mg | ORAL_TABLET | Freq: Once | ORAL | Status: AC
  Administered 2019-01-17: 20:00:00 100 mg via ORAL
  Filled 2019-01-17: qty 1

## 2019-01-17 MED ORDER — ONDANSETRON HCL 4 MG/2ML IJ SOLN
4.0000 mg | Freq: Once | INTRAMUSCULAR | Status: AC
  Administered 2019-01-17: 4 mg via INTRAVENOUS
  Filled 2019-01-17: qty 2

## 2019-01-17 MED ORDER — ACETAMINOPHEN 500 MG PO TABS
1000.0000 mg | ORAL_TABLET | ORAL | Status: AC
  Administered 2019-01-17: 17:00:00 1000 mg via ORAL
  Filled 2019-01-17: qty 2

## 2019-01-17 MED ORDER — ONDANSETRON 4 MG PO TBDP: 4.0000 mg | ORAL_TABLET | Freq: Four times a day (QID) | ORAL | 0 refills | Status: DC | PRN

## 2019-01-17 NOTE — ED Triage Notes (Signed)
Pt states weakness, not sleeping in 3 nights, nausea x "days" (no vomiting, has been taking phenergan). States "sleeping pills aren't working" states was here last week for iron infusion. Has felt bad since.

## 2019-01-17 NOTE — ED Notes (Signed)
Report given to Tom, RN.

## 2019-01-17 NOTE — ED Notes (Signed)
Pt given meal tray.

## 2019-01-17 NOTE — ED Provider Notes (Signed)
[facility] Emergency Department Provider Note   ____________________________________________   First MD Initiated Contact with Patient 01/17/19 1535     (approximate)  I have reviewed the triage vital signs and the nursing notes.   HISTORY  Chief Complaint Weakness    HPI [name] is a 73 y.o. female with previously seen, multiple medical conditions including coronary disease, Sjogren's disease, recurrent sialoadenitis etc. Recently hospitalized very briefly for sialoadenitis, went home after iron infusion.  She reports since going home she just continued to have fatigue, feels like she is winded frequently with walking around the house, just feels very tired.  She has not noticed a fever chills.  No exposure to anyone known to have coronavirus, reports she was tested in the hospital.  Her sialoadenitis has improved and she completed her entire course of antibiotic.  She did have loose stool while on the antibiotic but after stopping it has gone away.     Past Medical History:  Diagnosis Date   1st degree AV block    ACE-inhibitor cough    Allergic rhinitis    Anemia    iron deficiency anemia   Anxiety    Aortic ectasia (HCC)    a. CT abd in 12/2016 incidentally noted aortic atherosclerosis and infrarenal abdominal aortic ectasia measuring as large as 2.7 cm with recommendation to repeat US in 2023.   Arthritis    Asthma    Cataract    Chronic depression    Chronic diastolic CHF (congestive heart failure) (HCC)    Chronic headache    COPD (chronic obstructive pulmonary disease) (HCC)    Coronary artery disease    a. DES to RCA and mid Cx 2009. b. CABG and bioprosthetic AVR May 2015. c. cutting balloon to prox Cx in 05/2016   Diabetes mellitus    type 2   Diverticulitis of colon    Essential hypertension    GERD (gastroesophageal reflux disease)    Hearing loss    History of blood transfusion 2013   History of  prosthetic aortic valve replacement    HOH (hard of hearing)    Hypercholesterolemia    intolerance of statins and niaspan   Mobitz type 1 second degree AV block    OSA (obstructive sleep apnea)    mild, intolerant of cpap   PAD (peripheral artery disease) ([place])    a. atherosclerosis by CT abd 12/2016 in [place].   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   Aortic ectasia, abdominal ([place]) 01/01/2017   GAD (generalized anxiety disorder) 09/26/2016   Acute posthemorrhagic anemia 08/24/2016   Acute respiratory failure with hypoxia ([place]) 36/64/4034   Acute diastolic CHF (congestive heart failure) ([place]) 08/24/2016   Hyponatremia 08/24/2016   Leukocytosis 08/24/2016   Fever 08/24/2016   Diarrhea 08/24/2016   Generalized weakness 08/24/2016   Acute diverticulitis 08/16/2016   Lower GI bleed    Ataxia 08/09/2016   Dizziness 08/09/2016   Staggering gait 08/09/2016   NSTEMI (non-ST elevated myocardial infarction) [facility])    AVB (atrioventricular block)    Atypical chest pain    Unstable angina ([place]) 05/25/2016   Palpitations 05/25/2016   PUD - recent Rx for H.Pylori 05/25/2016   Stenosis of coronary  artery stent    Recurrent major depressive disorder, in partial remission ([place]) 07/29/2015   Sialadenitis 04/25/2015   Viral URI 03/24/2015   Hypoglycemia 08/25/2014   Nausea without vomiting 08/25/2014   Dyslipidemia-statin ontol    Long-term insulin use ([place]) 03/31/2014   Aortic valve disorder 11/11/2013   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 10/12/2013   Coronary Artery Disease s/p CABG 10/2013 10/10/2013   Other malaise and fatigue 09/11/2013   Alopecia 03/06/2013   Insomnia 02/06/2013   Gastroesophageal  reflux disease without esophagitis 01/19/2013   Dysphagia, unspecified(787.20) 01/19/2013   Dyspnea 11/17/2012   Weakness 11/17/2012   Abnormal MRI of head 09/05/2012   Depression with anxiety 03/21/2012   Extrinsic asthma 11/12/2011   Anemia 06/07/2011   Chronic obstructive pulmonary disease ([place]) 01/25/2010   Acquired hypothyroidism 08/10/2009   MICROSCOPIC HEMATURIA 05/07/2009   GOITER 03/02/2009   Essential hypertension 01/06/2009   MEMORY LOSS 06/23/2008   Obstructive sleep apnea 12/25/2007   Insulin dependent diabetes mellitus ([place]) 09/09/2006   ALLERGIC RHINITIS 09/09/2006   DIVERTICULOSIS, COLON 09/09/2006    Past Surgical History:  Procedure Laterality Date   ABDOMINAL HYSTERECTOMY     ABDOMINAL HYSTERECTOMY W/ PARTIAL VAGINACTOMY     AORTIC VALVE REPLACEMENT N/A 10/12/2013   Procedure: AORTIC VALVE REPLACEMENT (AVR);  Surgeon: [name], MD;  Location: [place];  Service: Open Heart Surgery;  Laterality: N/A;   APPENDECTOMY  1964   BARTHOLIN GLAND CYST EXCISION     BLADDER SUSPENSION     BREAST BIOPSY Bilateral 09/11/2000   neg   BREAST BIOPSY Left 07/24/2010   neg   BREAST CYST EXCISION  1988   bilateral nonmalignant tumors, x3   CARDIAC CATHETERIZATION     CARDIAC CATHETERIZATION N/A 05/25/2016   Procedure: Coronary Balloon Angioplasty;  Surgeon: Leonie Man, MD;  Location: [place] [facility];  Service: Cardiovascular;  Laterality: N/A;   CARDIAC CATHETERIZATION N/A 05/25/2016   Procedure: Coronary/Graft Angiography;  Surgeon: Leonie Man, MD;  Location: [place] [facility];  Service: Cardiovascular;  Laterality: N/A;   CATARACT EXTRACTION W/ INTRAOCULAR LENS  IMPLANT, BILATERAL     CHOLECYSTECTOMY  2001   COLECTOMY     lap sigmoid   COLONOSCOPY  2014   polyps found, 2 clamped off.   CORONARY ANGIOPLASTY  10/29/2007   Prox RCA & Mid Cx.   CORONARY ARTERY BYPASS GRAFT N/A 10/12/2013   Procedure: CORONARY ARTERY BYPASS  GRAFT TIMES TWO;  Surgeon: [name], MD;  Location: [place] [facility];  Service: Open Heart Surgery;  Laterality: N/A;   CORONARY/GRAFT ANGIOGRAPHY N/A 09/20/2017   Procedure: CORONARY/GRAFT ANGIOGRAPHY;  Surgeon: Sherren Mocha, MD;  Location: [place] [facility];  Service: Cardiovascular;  Laterality: N/A;   LEFT HEART CATHETERIZATION WITH CORONARY ANGIOGRAM N/A 10/09/2013   Procedure: LEFT HEART CATHETERIZATION WITH CORONARY ANGIOGRAM;  Surgeon: [name], MD;  Location[facility] C[facility];  Service: Cardiovascular;  Laterality: N/A;   STERNAL WIRES REMOVAL N/A 04/13/2014   Procedure: STERNAL WIRES REMOVAL;  Surgeon: [name], MD;  Location: [facility];  Service: Thoracic;  Laterality: N/A;   THYROIDECTOMY     TONSILLECTOMY     TUBAL LIGATION     VAGINAL DELIVERY     3   VISCERAL ARTERY INTERVENTION N/A 08/16/2016   Procedure: Visceral Artery Intervention;  Surgeon: Algernon Huxley, MD;  Location: [place] [facility];  Service: Cardiovascular;  Laterality: N/A;    Prior to Admission medications   Medication  Sig Start Date End Date Taking? Authorizing Provider  ARIPiprazole (ABILIFY) 2 MG tablet Take 2 mg by mouth daily.   Yes [provider]  budesonide (PULMICORT) 0.5 MG/2ML nebulizer solution Take 0.5 mg by nebulization daily.    Yes [provider]  Calcium-Vitamin D (CALTRATE 600 PLUS-VIT D PO) Take 1 tablet by mouth 2 (two) times daily.    Yes [provider]  citalopram (CELEXA) 20 MG tablet Take 20 mg by mouth daily.   Yes [provider]  furosemide (LASIX) 20 MG tablet Take 20 mg by mouth daily as needed for fluid or edema.    Yes [provider]  insulin NPH-regular Human (NOVOLIN 70/30) (70-30) 100 UNIT/ML injection Inject 75-95 Units into the skin See admin instructions. Inject 95u under the skin every morning at breakfast and inject 75u under the skin every evening with supper   Yes [provider]  levothyroxine  (SYNTHROID, LEVOTHROID) 125 MCG tablet Take 125 mcg by mouth daily.  04/03/17  Yes [provider]  metFORMIN (GLUCOPHAGE) 1000 MG tablet Take 1,000 mg by mouth 2 (two) times daily with a meal.   Yes [provider]  nitroGLYCERIN (NITROSTAT) 0.4 MG SL tablet Place 1 tablet (0.4 mg total) under the tongue every 5 (five) minutes as needed for chest pain. 07/25/17  Yes Rosita Fire, Brittainy M, PA-C  olmesartan (BENICAR) 20 MG tablet Take 20 mg by mouth at bedtime.   Yes [provider]  pantoprazole (PROTONIX) 20 MG tablet Take 20 mg by mouth daily. 01/23/17  Yes [provider]  promethazine (PHENERGAN) 25 MG tablet Take 25 mg by mouth every 6 (six) hours as needed for nausea or vomiting.   Yes [provider]  traZODone (DESYREL) 100 MG tablet Take 100 mg by mouth at bedtime.    Yes [provider]  doxycycline (VIBRAMYCIN) 100 MG capsule Take 1 capsule (100 mg total) by mouth 2 (two) times daily. 01/17/19   Delman Kitten, MD  ondansetron (ZOFRAN ODT) 4 MG disintegrating tablet Take 1 tablet (4 mg total) by mouth every 6 (six) hours as needed for nausea or vomiting. 01/17/19   Delman Kitten, MD  ranolazine (RANEXA) 500 MG 12 hr tablet Take 1 tablet (500 mg total) by mouth 2 (two) times daily. Patient not taking: Reported on 01/17/2019 03/05/18   Burtis Junes, NP    Allergies Amitriptyline, Benadryl [diphenhydramine], Demerol [meperidine], Gabapentin, Loratadine, Meperidine hcl, Mirtazapine, Olanzapine, Voltaren [diclofenac sodium], Zetia [ezetimibe], Ativan [lorazepam], Atorvastatin, Budesonide-formoterol fumarate, Bupropion hcl, Caffeine, Codeine sulfate, Lisinopril, Metformin, Mometasone furoate, Morphine sulfate, Other, Oxycodone-acetaminophen, Pioglitazone, Propoxyphene n-acetaminophen, Rosuvastatin, Shellfish allergy, Suvorexant, Ticagrelor, Tramadol, Trazodone and nefazodone, Venlafaxine, Zolpidem tartrate, and Latex  Family History  Problem  Relation Age of Onset   Breast cancer Mother 44   Hypertension Father    Mesothelioma Father    Asthma Father    Stroke Paternal Grandfather    Heart disease Other    Breast cancer Maternal Aunt    Breast cancer Paternal Aunt     Social History Social History   Tobacco Use   Smoking status: Former Smoker    Packs/day: 0.50    Years: 30.00    Pack years: 15.00    Types: Cigarettes    Quit date: 10/02/2013    Years since quitting: 5.2   Smokeless tobacco: Never Used  Substance Use Topics   Alcohol use: No   Drug use: No    Review of Systems Constitutional: No fever/chills except  she reports when she checked in at the ER desk she had a fever that she did not know about.  She is generally fatigued Eyes: No visual changes. ENT: No sore throat.  Swelling of her parotid gland is much improved Cardiovascular: Denies chest pain. Respiratory: Denies shortness of breath at rest, but seems to be of little more apparent with exertion around the house over the last couple months, reports chronic mild shortness of breath seems to be slightly worse. Gastrointestinal: No abdominal pain.  She has had frequent nausea seems to get worse after eating, but no associated pain.  Taking Phenergan for this. Genitourinary: Negative for dysuria. Musculoskeletal: Negative for back pain. Skin: Negative for rash. Neurological: Negative for headaches, areas of focal weakness or numbness.    ____________________________________________   PHYSICAL EXAM:  VITAL SIGNS: ED Triage Vitals  Enc Vitals Group     BP 01/17/19 1432 (!) 150/70     Pulse Rate 01/17/19 1432 91     Resp 01/17/19 1432 18     Temp 01/17/19 1432 (!) 100.8 F (38.2 C)     Temp Source 01/17/19 1432 Oral     SpO2 01/17/19 1432 100 %     Weight 01/17/19 1433 200 lb (90.7 kg)     Height 01/17/19 1433 5\' 6"  (1.676 m)     Head Circumference --      Peak Flow --      Pain Score 01/17/19 1432 0     Pain Loc --      Pain  Edu? --      Excl. in [place]? --     Constitutional: Alert and oriented. Well appearing and in no acute distress.  She is pleasant.  She does appear mildly to moderately fatigued but in no acute distress. Eyes: Conjunctivae are normal. Head: Atraumatic. Nose: No congestion/rhinnorhea.  Hard of hearing Mouth/Throat: Mucous membranes are moist. Neck: No stridor.  Cardiovascular: Normal rate, regular rhythm. Grossly normal heart sounds.  Good peripheral circulation. Respiratory: Normal respiratory effort.  No retractions. Lungs CTAB.  Speaks in full and very clear sentences. Gastrointestinal: Soft and nontender. No distention. Musculoskeletal: No lower extremity tenderness nor edema. Neurologic:  Normal speech and language. No gross focal neurologic deficits are appreciated.  Skin:  Skin is warm, dry and intact. No rash noted.  No shakes or chills. Psychiatric: Mood and affect are normal. Speech and behavior are normal.  ____________________________________________   LABS (all labs ordered are listed, but only abnormal results are displayed)  Labs Reviewed  BASIC METABOLIC PANEL - Abnormal; Notable for the following components:      Result Value   Sodium 133 (*)    Glucose, Bld 283 (*)    Creatinine, Ser 1.07 (*)    Calcium 8.4 (*)    GFR calc non Af Amer 51 (*)    GFR calc Af Amer 59 (*)    All other components within normal limits  CBC - Abnormal; Notable for the following components:   WBC 12.6 (*)    Hemoglobin 8.5 (*)    HCT 29.9 (*)    MCV 75.3 (*)    MCH 21.4 (*)    MCHC 28.4 (*)    RDW 20.4 (*)    All other components within normal limits  URINALYSIS, COMPLETE (UACMP) WITH MICROSCOPIC - Abnormal; Notable for the following components:   Color, Urine YELLOW (*)    APPearance CLEAR (*)    Glucose, UA 50 (*)    Leukocytes,Ua TRACE (*)  All other components within normal limits  SARS CORONAVIRUS 2 (HOSPITAL ORDER, [place] LAB)  CULTURE, BLOOD  (ROUTINE X 2)  CULTURE, BLOOD (ROUTINE X 2)  URINE CULTURE  CBG MONITORING, ED   ____________________________________________  EKG   ____________________________________________  RADIOLOGY  Dg Chest Portable 1 View  Result Date: 01/17/2019 CLINICAL DATA:  Fever evaluation. Former smoker. EXAM: PORTABLE CHEST 1 VIEW COMPARISON:  Chest x-rays dated 03/04/2018 and 12/28/2017. FINDINGS: Stable mild cardiomegaly. Median sternotomy wires appear intact and stable in alignment, for presumed CABG. Lungs are clear. No pleural effusion or pneumothorax seen. Osseous structures about the chest are unremarkable. IMPRESSION: No active disease. No evidence of pneumonia or pulmonary edema. Electronically Signed   By: Franki Cabot M.D.   On: 01/17/2019 16:13    Imaging reviewed negative for acute ____________________________________________   PROCEDURES  Procedure(s) performed: None  Procedures  Critical Care performed: No  ____________________________________________   INITIAL IMPRESSION / ASSESSMENT AND PLAN / ED COURSE  Pertinent labs & imaging results that were available during my care of the patient were reviewed by me and considered in my medical decision making (see chart for details).   Fatigue.  Patient reports fatigue and chronic dyspnea, may be slightly worse over last 10 days since leaving the hospital.  No chest pain.  Not noted to have any fever at home, but has fever 100.8 here.  Recent sialoadenitis treated through full course of antibiotics reported some crampy abdominal discomfort nausea and diarrhea with antibiotic use but this is gone away.  No associate abdominal pain.  No longer having loose stools.  Differential diagnosis given her fever certainly includes infectious etiology, including indolent effects from her sialoadenitis or perhaps though seemingly very unlikely bacteremia.  She completed her entire course of antibiotic and her sialoadenitis responded very well  without any signs of ongoing infection at this time in the glandular region  Clinical Course as of Jan 17 2020  Sat Jan 17, 2019  1533 Hemoglobin(!): 8.5 [MQ]  1533 Baseline  Creatinine(!): 1.07 [MQ]  1534 Slight bump in Cr   [MQ]  1802 Discussed with Dr. Richardson Landry (ENT) [name] from sialoadenitis would be quite unlikely. Staph can be associated, but would be considered rate.    [MQ]  9323 Patient resting comfortably, sitting up eating meal tray now.  No distress.  Discussed the case with ear nose and throat, would be very unlikely this patient would develop bacteremia from sialoadenitis especially given her response and improvement and improvement in any swelling or pain around the left mandible.  I discussed with Dr. Richardson Landry, I suspect given her fever though and fatigue without other source identified and we discussed it would be reasonable to consider discharge on doxycycline if she is COVID negative for 1 week with close follow-up as her blood cultures were followed.  I discussed with the patient this versus consideration for inpatient observation to rule out bloodstream infection and the patient strongly desires to be discharged and would want to be hospitalized at this time.  She also reports she can follow-up with Dr. Sabra Heck on Monday.   [MQ]    Clinical Course User Index [MQ] Delman Kitten, MD   Will obtain chest x-ray, blood cultures, lab work, hydration, provide antiemetic for mild nausea.  Urinalysis etc.  ----------------------------------------- 8:21 PM on 01/17/2019 -----------------------------------------  Patient has eaten a meal, sitting up at bedside, conversant.  Discussed with the patient, will discharge with antibiotic including doxycycline as we await blood cultures.  I feel the likelihood of bacteremia is extremely low, she appears nontoxic, her fever has improved and vital signs have normalized.  She is resting comfortably, message sent and cultures will be followed up  by Caesar Chestnut and also CCed to Dr. Emily Filbert for close follow-up purposes.  Very careful return precautions discussed with the patient and her husband.  Both in agreement with plan.  [name] was evaluated in Emergency Department on 01/17/2019 for the symptoms described in the history of present illness. She was evaluated in the context of the global COVID-19 pandemic, which necessitated consideration that the patient might be at risk for infection with the SARS-CoV-2 virus that causes COVID-19. Institutional protocols and algorithms that pertain to the evaluation of patients at risk for COVID-19 are in a state of rapid change based on information released by regulatory bodies including the CDC and federal and state organizations. These policies and algorithms were followed during the patient's care in the ED.   ____________________________________________   FINAL CLINICAL IMPRESSION(S) / ED DIAGNOSES  Final diagnoses:  Fever, unspecified fever cause        Note:  This document was prepared using Dragon voice recognition software and may include unintentional dictation errors       Delman Kitten, MD 01/17/19 2024

## 2019-01-17 NOTE — ED Notes (Signed)
Husband at bedside.  

## 2019-01-17 NOTE — ED Notes (Addendum)
Pt was seen here 8/5 for swollen parotid glands- pt was given amoxicillin and a steroid- since then pt has been weak, not sleeping well, and nauseated- pt states she has been taking her phenergan but it is not helping

## 2019-01-17 NOTE — ED Notes (Signed)
X-ray at bedside

## 2019-01-17 NOTE — Discharge Instructions (Addendum)
Follow-up closely, call Dr. Ammie Ferrier office Monday.  Please return the emergency room right away if you develop increased weakness, fever is recurring, he developed shortness of breath, headache, nausea and vomiting, or worsening symptoms arise.

## 2019-01-19 LAB — URINE CULTURE: Culture: <10000 — AB

## 2019-01-22 LAB — CULTURE, BLOOD (ROUTINE X 2)
Culture: NO GROWTH
Culture: NO GROWTH
Special Requests: ADEQUATE

## 2019-01-26 ENCOUNTER — Telehealth: Payer: Self-pay | Admitting: Emergency Medicine

## 2019-01-26 NOTE — Telephone Encounter (Signed)
Called patient at request of ED MD to give results of cultures and assure she has made follow up plans. She saw dr a today and is aware that blood cultures are negative.

## 2019-01-29 ENCOUNTER — Other Ambulatory Visit: Payer: Self-pay

## 2019-01-30 ENCOUNTER — Inpatient Hospital Stay: Payer: Medicare HMO | Attending: Oncology | Admitting: Oncology

## 2019-01-30 ENCOUNTER — Inpatient Hospital Stay: Payer: Medicare HMO

## 2019-01-30 ENCOUNTER — Other Ambulatory Visit: Payer: Self-pay

## 2019-01-30 ENCOUNTER — Encounter: Payer: Self-pay | Admitting: Oncology

## 2019-01-30 VITALS — BP 135/66 | HR 80 | Temp 97.8°F | Resp 18

## 2019-01-30 DIAGNOSIS — K922 Gastrointestinal hemorrhage, unspecified: Secondary | ICD-10-CM | POA: Insufficient documentation

## 2019-01-30 DIAGNOSIS — R238 Other skin changes: Secondary | ICD-10-CM

## 2019-01-30 DIAGNOSIS — D500 Iron deficiency anemia secondary to blood loss (chronic): Secondary | ICD-10-CM

## 2019-01-30 DIAGNOSIS — R233 Spontaneous ecchymoses: Secondary | ICD-10-CM

## 2019-01-30 LAB — CBC WITH DIFFERENTIAL/PLATELET
Abs Immature Granulocytes: 0.05 10*3/uL (ref 0.00–0.07)
Basophils Absolute: 0.1 10*3/uL (ref 0.0–0.1)
Basophils Relative: 1 %
Eosinophils Absolute: 0.2 10*3/uL (ref 0.0–0.5)
Eosinophils Relative: 3 %
HCT: 26.6 % — ABNORMAL LOW (ref 36.0–46.0)
Hemoglobin: 7.6 g/dL — ABNORMAL LOW (ref 12.0–15.0)
Immature Granulocytes: 1 %
Lymphocytes Relative: 20 %
Lymphs Abs: 1.6 10*3/uL (ref 0.7–4.0)
MCH: 21.5 pg — ABNORMAL LOW (ref 26.0–34.0)
MCHC: 28.6 g/dL — ABNORMAL LOW (ref 30.0–36.0)
MCV: 75.1 fL — ABNORMAL LOW (ref 80.0–100.0)
Monocytes Absolute: 0.5 10*3/uL (ref 0.1–1.0)
Monocytes Relative: 6 %
Neutro Abs: 5.7 10*3/uL (ref 1.7–7.7)
Neutrophils Relative %: 69 %
Platelets: 212 10*3/uL (ref 150–400)
RBC: 3.54 MIL/uL — ABNORMAL LOW (ref 3.87–5.11)
RDW: 20 % — ABNORMAL HIGH (ref 11.5–15.5)
WBC: 8.1 10*3/uL (ref 4.0–10.5)
nRBC: 0 % (ref 0.0–0.2)

## 2019-01-30 LAB — COMPREHENSIVE METABOLIC PANEL
ALT: 18 U/L (ref 0–44)
AST: 30 U/L (ref 15–41)
Albumin: 3.2 g/dL — ABNORMAL LOW (ref 3.5–5.0)
Alkaline Phosphatase: 74 U/L (ref 38–126)
Anion gap: 10 (ref 5–15)
BUN: 17 mg/dL (ref 8–23)
CO2: 20 mmol/L — ABNORMAL LOW (ref 22–32)
Calcium: 8.3 mg/dL — ABNORMAL LOW (ref 8.9–10.3)
Chloride: 109 mmol/L (ref 98–111)
Creatinine, Ser: 1.09 mg/dL — ABNORMAL HIGH (ref 0.44–1.00)
GFR calc Af Amer: 58 mL/min — ABNORMAL LOW (ref >60)
GFR calc non Af Amer: 50 mL/min — ABNORMAL LOW (ref >60)
Glucose, Bld: 149 mg/dL — ABNORMAL HIGH (ref 70–99)
Potassium: 4.6 mmol/L (ref 3.5–5.1)
Sodium: 139 mmol/L (ref 135–145)
Total Bilirubin: 0.4 mg/dL (ref 0.3–1.2)
Total Protein: 7 g/dL (ref 6.5–8.1)

## 2019-01-30 LAB — RETIC PANEL
Immature Retic Fract: 26.7 % — ABNORMAL HIGH (ref 2.3–15.9)
RBC.: 3.54 MIL/uL — ABNORMAL LOW (ref 3.87–5.11)
Retic Count, Absolute: 118.9 10*3/uL (ref 19.0–186.0)
Retic Ct Pct: 3.4 % — ABNORMAL HIGH (ref 0.4–3.1)
Reticulocyte Hemoglobin: 23.8 pg — ABNORMAL LOW (ref >27.9)

## 2019-01-30 LAB — APTT: aPTT: 32 seconds (ref 24–36)

## 2019-01-30 LAB — PROTIME-INR
INR: 1.1 (ref 0.8–1.2)
Prothrombin Time: 14.1 seconds (ref 11.4–15.2)

## 2019-01-30 NOTE — Progress Notes (Signed)
Hematology/Oncology Consult note [facility] [phone] [phone]   Patient Care Team: [place] as PCP - General Angelena Form, Annita Brod, MD as PCP - Cardiology (Cardiology) Elsie Stain, MD as Attending Physician (Pulmonary Disease) [name], MD as Consulting Physician (Cardiology) Solum, Betsey Holiday, MD as Physician Assistant (Internal Medicine) [name], MD as Consulting Physician (Cardiothoracic Surgery)  REFERRING PROVIDER: Ottie Glazier, MD CHIEF COMPLAINTS/REASON FOR VISIT:  Evaluation of iron deficiency anemia  HISTORY OF PRESENTING ILLNESS:  [name] is a  73 y.o.  female with PMH listed below was seen in consultation at the request of Ottie Glazier, MD   for evaluation of iron deficiency anemia.   Reviewed patient's recent labs  01/17/2019 labs revealed anemia with hemoglobin of 8.5, MCV 75.3. Reviewed patient's previous labs ordered by primary care physician's office, anemia is chronic onset , duration is since 2009.  Patient presented to emergency room on 01/17/2019 for evaluation of fatigue, patient is currently on a 10-day course of doxycycline prescribed by emergency room. She was also recently admitted between 01/07/2019 - 01/08/2019 due to parotitis submandibular gland inflammation.  Received a Decadron and Unasyn.  During that admission, patient received IV Venofer treatments.   Associated signs and symptoms: Patient reports fatigue.  Associated with SOB with exertion.  Denies weight loss, easy bruising, hematochezia, hemoptysis, hematuria. Context:  History of iron deficiency: History of iron deficiency. Rectal bleeding: Denies any blood in the stool. Patient had a history of GI bleeding. Last endoscopy: 11/08/2012 colonoscopy a single solitary 12 mm ulcer was found in the descending colon.  A lot of blood suctioned from colon.  Some bleeding was present.  Stigmata of recent bleeding  was present.  Area was successfully injected with 6 mL of a 1-10,000 solution of epinephrine for drug delivery.  1 large hemostatic clip was successfully placed.  Single large mouth diverticulum was found in the sigmoid colon.  Area was successfully injected with 3 mils of 1-10,000 solution of epinephrine.  Benign polyps were removed. 01/28/2013 EGD by Dr. Jamal Collin showed erythematous mucosa in the antrum.  Esophageal.  Mucosal changes suspicious for Barrett esophagus.  Biopsy showed antral mucosa with moderate reactive gastropathy.  No intestinal metaplasia, dysplasia or malignancy.  No H. pylori.  GE junction biopsy showed stratified squamous epithelium without inflammation or reactive changes.  And a single tiny focus of columnar epithelium most compatible with an esophageal clot.  No interstitial metaplasia or dysplasia or malignancy.   Hematemesis or hemoptysis : denies Blood in urine : denies   Fatigue: Yes.  SOB: Yes  Also reports easy bruising and bleeding.  She check her blood sugar every day and reports blood dripping from the small lancet needle hole.  Denies any gum bleeding, nosebleeding or blood in the stool.  Review of Systems  Constitutional: Positive for fatigue. Negative for appetite change, chills and fever.  HENT:   Negative for hearing loss and voice change.   Eyes: Negative for eye problems.  Respiratory: Positive for shortness of breath. Negative for chest tightness and cough.   Cardiovascular: Negative for chest pain.  Gastrointestinal: Negative for abdominal distention, abdominal pain and blood in stool.  Endocrine: Negative for hot flashes.  Genitourinary: Negative for difficulty urinating and frequency.   Musculoskeletal: Negative for arthralgias.  Skin: Negative for itching and rash.  Neurological: Negative for extremity weakness.  Hematological: Negative for adenopathy.  Psychiatric/Behavioral: Negative for confusion.    MEDICAL HISTORY:  Past  Medical History:    Diagnosis Date   1st degree AV block    ACE-inhibitor cough    Allergic rhinitis    Anemia    iron deficiency anemia   Anxiety    Aortic ectasia (HCC)    a. CT abd in 12/2016 incidentally noted aortic atherosclerosis and infrarenal abdominal aortic ectasia measuring as large as 2.7 cm with recommendation to repeat US in 2023.   Arthritis    Asthma    Cataract    Chronic depression    Chronic diastolic CHF (congestive heart failure) (HCC)    Chronic headache    COPD (chronic obstructive pulmonary disease) (HCC)    Coronary artery disease    a. DES to RCA and mid Cx 2009. b. CABG and bioprosthetic AVR May 2015. c. cutting balloon to prox Cx in 05/2016   Diabetes mellitus    type 2   Diverticulitis of colon    Essential hypertension    GERD (gastroesophageal reflux disease)    Hearing loss    History of blood transfusion 2013   History of prosthetic aortic valve replacement    HOH (hard of hearing)    Hypercholesterolemia    intolerance of statins and niaspan   Mobitz type 1 second degree AV block    OSA (obstructive sleep apnea)    mild, intolerant of cpap   PAD (peripheral artery disease) ([place])    a. atherosclerosis by CT abd 12/2016 in [place].   PONV (postoperative nausea and vomiting)    Statin intolerance    Thyroid disease     SURGICAL HISTORY: Past Surgical History:  Procedure Laterality Date   ABDOMINAL HYSTERECTOMY     ABDOMINAL HYSTERECTOMY W/ PARTIAL VAGINACTOMY     AORTIC VALVE REPLACEMENT N/A 10/12/2013   Procedure: AORTIC VALVE REPLACEMENT (AVR);  Surgeon: [name], MD;  Location: [place];  Service: Open Heart Surgery;  Laterality: N/A;   APPENDECTOMY  1964   BARTHOLIN GLAND CYST EXCISION     BLADDER SUSPENSION     BREAST BIOPSY Bilateral 09/11/2000   neg   BREAST BIOPSY Left 07/24/2010   neg   BREAST CYST EXCISION  1988   bilateral nonmalignant tumors, x3   CARDIAC CATHETERIZATION     CARDIAC CATHETERIZATION N/A  05/25/2016   Procedure: Coronary Balloon Angioplasty;  Surgeon: Leonie Man, MD;  Location: [place] [facility];  Service: Cardiovascular;  Laterality: N/A;   CARDIAC CATHETERIZATION N/A 05/25/2016   Procedure: Coronary/Graft Angiography;  Surgeon: Leonie Man, MD;  Location: [place] [facility];  Service: Cardiovascular;  Laterality: N/A;   CATARACT EXTRACTION W/ INTRAOCULAR LENS  IMPLANT, BILATERAL     CHOLECYSTECTOMY  2001   COLECTOMY     lap sigmoid   COLONOSCOPY  2014   polyps found, 2 clamped off.   CORONARY ANGIOPLASTY  10/29/2007   Prox RCA & Mid Cx.   CORONARY ARTERY BYPASS GRAFT N/A 10/12/2013   Procedure: CORONARY ARTERY BYPASS GRAFT TIMES TWO;  Surgeon: [name], MD;  Location: [facility];  Service: Open Heart Surgery;  Laterality: N/A;   CORONARY/GRAFT ANGIOGRAPHY N/A 09/20/2017   Procedure: CORONARY/GRAFT ANGIOGRAPHY;  Surgeon: Sherren Mocha, MD;  Location: [place] [facility];  Service: Cardiovascular;  Laterality: N/A;   LEFT HEART CATHETERIZATION WITH CORONARY ANGIOGRAM N/A 10/09/2013   Procedure: LEFT HEART CATHETERIZATION WITH CORONARY ANGIOGRAM;  Surgeon: [name], MD;  Location: [facility];  Service: Cardiovascular;  Laterality: N/A;   STERNAL WIRES REMOVAL N/A  04/13/2014   Procedure: STERNAL WIRES REMOVAL;  Surgeon: [name], MD;  Location: [facility];  Service: Thoracic;  Laterality: N/A;   THYROIDECTOMY     TONSILLECTOMY     TUBAL LIGATION     VAGINAL DELIVERY     3   VISCERAL ARTERY INTERVENTION N/A 08/16/2016   Procedure: Visceral Artery Intervention;  Surgeon: Algernon Huxley, MD;  Location: [place] [facility];  Service: Cardiovascular;  Laterality: N/A;    SOCIAL HISTORY: Social History   Socioeconomic History   Marital status: Married    Spouse name: Not on file   Number of children: 3   Years of education: Not on file   Highest education level: Not on file  Occupational History   Occupation: Retired    Fish farm manager:  UNEMPLOYED    Comment: CNA  Scientist, product/process development strain: Not on file   Food insecurity    Worry: Not on file    Inability: Not on file   Transportation needs    Medical: Not on file    Non-medical: Not on file  Tobacco Use   Smoking status: Former Smoker    Packs/day: 0.50    Years: 30.00    Pack years: 15.00    Types: Cigarettes    Quit date: 10/02/2013    Years since quitting: 5.3   Smokeless tobacco: Never Used  Substance and Sexual Activity   Alcohol use: No   Drug use: No   Sexual activity: Not Currently  Lifestyle   Physical activity    Days per week: Not on file    Minutes per session: Not on file   Stress: Not on file  Relationships   Social connections    Talks on phone: Not on file    Gets together: Not on file    Attends religious service: Not on file    Active member of club or organization: Not on file    Attends meetings of clubs or organizations: Not on file    Relationship status: Not on file   Intimate partner violence    Fear of current or ex partner: Not on file    Emotionally abused: Not on file    Physically abused: Not on file    Forced sexual activity: Not on file  Other Topics Concern   Not on file  Social History Narrative   Does not have Living Will   Desires CPR, would not want prolonged life support if futile.    FAMILY HISTORY: Family History  Problem Relation Age of Onset   Breast cancer Mother 68   Hypertension Father    Mesothelioma Father    Asthma Father    Stroke Paternal Grandfather    Heart disease Other    Breast cancer Maternal Aunt    Breast cancer Paternal Aunt     ALLERGIES:  is allergic to amitriptyline; benadryl [diphenhydramine]; demerol [meperidine]; gabapentin; loratadine; meperidine hcl; mirtazapine; olanzapine; voltaren [diclofenac sodium]; zetia [ezetimibe]; ativan [lorazepam]; atorvastatin; budesonide-formoterol fumarate; bupropion hcl; caffeine; codeine sulfate; lisinopril;  metformin; mometasone furoate; morphine sulfate; other; oxycodone-acetaminophen; pioglitazone; propoxyphene n-acetaminophen; rosuvastatin; shellfish allergy; suvorexant; ticagrelor; tramadol; trazodone and nefazodone; venlafaxine; zolpidem tartrate; and latex.  MEDICATIONS:  Current Outpatient Medications  Medication Sig Dispense Refill   budesonide (PULMICORT) 0.5 MG/2ML nebulizer solution Take 0.5 mg by nebulization daily.      Calcium-Vitamin D (CALTRATE 600 PLUS-VIT D PO) Take 1 tablet by mouth 2 (two) times daily.      citalopram (  CELEXA) 20 MG tablet Take 20 mg by mouth daily.     doxycycline (VIBRAMYCIN) 100 MG capsule Take 1 capsule (100 mg total) by mouth 2 (two) times daily. 20 capsule 0   furosemide (LASIX) 20 MG tablet Take 20 mg by mouth daily as needed for fluid or edema.      insulin NPH-regular Human (NOVOLIN 70/30) (70-30) 100 UNIT/ML injection Inject 75-95 Units into the skin See admin instructions. Inject 95u under the skin every morning at breakfast and inject 75u under the skin every evening with supper     levothyroxine (SYNTHROID, LEVOTHROID) 125 MCG tablet Take 125 mcg by mouth daily.      metFORMIN (GLUCOPHAGE) 1000 MG tablet Take 1,000 mg by mouth 2 (two) times daily with a meal.     nitroGLYCERIN (NITROSTAT) 0.4 MG SL tablet Place 1 tablet (0.4 mg total) under the tongue every 5 (five) minutes as needed for chest pain. 25 tablet 1   olmesartan (BENICAR) 20 MG tablet Take 20 mg by mouth at bedtime.     ondansetron (ZOFRAN ODT) 4 MG disintegrating tablet Take 1 tablet (4 mg total) by mouth every 6 (six) hours as needed for nausea or vomiting. 20 tablet 0   pantoprazole (PROTONIX) 20 MG tablet Take 20 mg by mouth daily.     promethazine (PHENERGAN) 25 MG tablet Take 25 mg by mouth every 6 (six) hours as needed for nausea or vomiting.     traZODone (DESYREL) 100 MG tablet Take 100 mg by mouth at bedtime.      ARIPiprazole (ABILIFY) 2 MG tablet Take 2 mg by  mouth daily.     ranolazine (RANEXA) 500 MG 12 hr tablet Take 1 tablet (500 mg total) by mouth 2 (two) times daily. (Patient not taking: Reported on 01/30/2019) 60 tablet 6   Current Facility-Administered Medications  Medication Dose Route Frequency Provider Last Rate Last Dose   umeclidinium bromide (INCRUSE ELLIPTA) 62.5 MCG/INH 1 puff  1 puff Inhalation Daily Kasa, Kurian, MD         PHYSICAL EXAMINATION: ECOG PERFORMANCE STATUS: 2 - Symptomatic, <50% confined to bed Vitals:   01/30/19 0944  BP: 135/66  Pulse: 80  Resp: 18  Temp: 97.8 F (36.6 C)   Filed Weights   Physical Exam Constitutional:      General: She is not in acute distress.    Appearance: She is obese.     Comments: Sitting in the wheelchair.  HENT:     Head: Normocephalic and atraumatic.  Eyes:     General: No scleral icterus.    Pupils: Pupils are equal, round, and reactive to light.  Neck:     Musculoskeletal: Normal range of motion and neck supple.  Cardiovascular:     Rate and Rhythm: Normal rate and regular rhythm.     Heart sounds: Normal heart sounds.  Pulmonary:     Effort: Pulmonary effort is normal. No respiratory distress.     Breath sounds: No wheezing.  Abdominal:     General: Bowel sounds are normal. There is no distension.     Palpations: Abdomen is soft. There is no mass.     Tenderness: There is no abdominal tenderness.  Musculoskeletal: Normal range of motion.        General: Swelling present. No deformity.  Skin:    General: Skin is warm and dry.     Coloration: Skin is pale.     Findings: No erythema or rash.  Neurological:  Mental Status: She is alert and oriented to person, place, and time.     Cranial Nerves: No cranial nerve deficit.     Coordination: Coordination normal.  Psychiatric:        Behavior: Behavior normal.        Thought Content: Thought content normal.       CMP Latest Ref Rng & Units 01/30/2019  Glucose 70 - 99 mg/dL 149(H)  BUN 8 - 23 mg/dL 17    Creatinine 0.44 - 1.00 mg/dL 1.09(H)  Sodium 135 - 145 mmol/L 139  Potassium 3.5 - 5.1 mmol/L 4.6  Chloride 98 - 111 mmol/L 109  CO2 22 - 32 mmol/L 20(L)  Calcium 8.9 - 10.3 mg/dL 8.3(L)  Total Protein 6.5 - 8.1 g/dL 7.0  Total Bilirubin 0.3 - 1.2 mg/dL 0.4  Alkaline Phos 38 - 126 U/L 74  AST 15 - 41 U/L 30  ALT 0 - 44 U/L 18   CBC Latest Ref Rng & Units 01/30/2019  WBC 4.0 - 10.5 K/uL 8.1  Hemoglobin 12.0 - 15.0 g/dL 7.6(L)  Hematocrit 36.0 - 46.0 % 26.6(L)  Platelets 150 - 400 K/uL 212     LABORATORY DATA:  I have reviewed the data as listed Lab Results  Component Value Date   WBC 8.1 01/30/2019   HGB 7.6 (L) 01/30/2019   HCT 26.6 (L) 01/30/2019   MCV 75.1 (L) 01/30/2019   PLT 212 01/30/2019   Recent Labs    01/07/19 2306 01/17/19 1436 01/30/19 1021  NA 136 133* 139  K 4.9 4.8 4.6  CL 105 101 109  CO2 22 22 20*  GLUCOSE 133* 283* 149*  BUN 17 14 17   CREATININE 0.98 1.07* 1.09*  CALCIUM 8.3* 8.4* 8.3*  GFRNONAA 57* 51* 50*  GFRAA >60 59* 58*  PROT  --   --  7.0  ALBUMIN  --   --  3.2*  AST  --   --  30  ALT  --   --  18  ALKPHOS  --   --  74  BILITOT  --   --  0.4   Iron/TIBC/Ferritin/ %Sat    Component Value Date/Time   IRON 15 (L) 01/08/2019 0750   IRON 41 (L) 03/31/2014 1417   TIBC 384 01/08/2019 0750   TIBC 264 03/31/2014 1417   FERRITIN 15 01/08/2019 0750   FERRITIN 406 (H) 03/31/2014 1417   IRONPCTSAT 4 (L) 01/08/2019 0750   IRONPCTSAT 16 03/31/2014 1417     Ct Soft Tissue Neck Wo Contrast  Result Date: 01/07/2019 CLINICAL DATA:  Recurrent parotiditis. Left neck swelling. Difficulty swallowing. EXAM: CT NECK WITHOUT CONTRAST TECHNIQUE: Multidetector CT imaging of the neck was performed following the standard protocol without intravenous contrast. COMPARISON:  None. FINDINGS: Pharynx and larynx: Normal. No mass or swelling. Salivary glands: There is mild enlargement of the left submandibular gland with moderate surrounding subcutaneous fat  stranding and thickening of the platysma. No sialadenitis. There is slight asymmetry of the parotid glands, but the small amount of inflammation adjacent to the left parotid gland is likely extending from the submandibular gland. Thyroid: Status post thyroidectomy Lymph nodes: Multiple subcentimeter left cervical lymph nodes. None enlarged or abnormal density. Vascular: Partial retropharyngeal course of both carotids. Mild aortic atherosclerosis. Limited intracranial: Normal Visualized orbits: Normal Mastoids and visualized paranasal sinuses: Clear Skeleton: Multilevel facet arthrosis. No bony spinal canal stenosis. Upper chest: Clear Other: None IMPRESSION: Enlargement of the left submandibular gland with moderate surrounding inflammatory stranding,  likely indicating submandibular sialadenitis. Slight stranding adjacent to the left parotid gland is favored to be an extension of the left submandibular process. Aortic atherosclerosis (ICD10-I70.0). Electronically Signed   By: Ulyses Jarred M.D.   On: 01/07/2019 23:02   Dg Chest Portable 1 View  Result Date: 01/17/2019 CLINICAL DATA:  Fever evaluation. Former smoker. EXAM: PORTABLE CHEST 1 VIEW COMPARISON:  Chest x-rays dated 03/04/2018 and 12/28/2017. FINDINGS: Stable mild cardiomegaly. Median sternotomy wires appear intact and stable in alignment, for presumed CABG. Lungs are clear. No pleural effusion or pneumothorax seen. Osseous structures about the chest are unremarkable. IMPRESSION: No active disease. No evidence of pneumonia or pulmonary edema. Electronically Signed   By: Franki Cabot M.D.   On: 01/17/2019 16:13      ASSESSMENT & PLAN:  1. Iron deficiency anemia due to chronic blood loss   2. Gastrointestinal hemorrhage, unspecified gastrointestinal hemorrhage type   3. Easy bruising    Labs are reviewed and discussed with patient. Consistent with iron deficiency anemia. Plan IV iron with Venofer 200mg  weekly x 4 doses. Allergy  reactions/infusion reaction including anaphylactic reaction discussed with patient. Other side effects include but not limited to high blood pressure, skin rash, weight gain, leg swelling, etc. Patient voices understanding and willing to proceed.  Given her history of GI bleeding, severe iron deficiency suspect ongoing chronic GI blood loss.  I recommend patient to reestablish care with gastroenterology at the [facility] clinic and had a repeat colonoscopy for evaluation. Patient reports that she did not have good experience for her last colonoscopy and prefers to wait until she feels better and then reconsider  Easy bruising, check baseline INR and APTT  Orders Placed This Encounter  Procedures   Protime-INR    Standing Status:   Future    Number of Occurrences:   1    Standing Expiration Date:   01/30/2020   APTT    Standing Status:   Future    Number of Occurrences:   1    Standing Expiration Date:   01/30/2020   CBC with Differential/Platelet    Standing Status:   Future    Number of Occurrences:   1    Standing Expiration Date:   01/30/2020   Comprehensive metabolic panel    Standing Status:   Future    Number of Occurrences:   1    Standing Expiration Date:   01/30/2020   Retic Panel    Standing Status:   Future    Number of Occurrences:   1    Standing Expiration Date:   01/30/2020    All questions were answered. The patient knows to call the clinic with any problems questions or concerns.  Cc Ottie Glazier, MD  Return of visit: 6-8 weeks Thank you for this kind referral and the opportunity to participate in the care of this patient. A copy of today's note is routed to referring provider  Total face to face encounter time for this patient visit was 76min. >50% of the time was  spent in counseling and coordination of care.    Earlie Server, MD, PhD Hematology Oncology [facility] Pager- [phone] 01/30/2019

## 2019-02-03 ENCOUNTER — Encounter: Payer: Self-pay | Admitting: Oncology

## 2019-02-03 ENCOUNTER — Other Ambulatory Visit: Payer: Self-pay | Admitting: Oncology

## 2019-02-03 DIAGNOSIS — D500 Iron deficiency anemia secondary to blood loss (chronic): Secondary | ICD-10-CM

## 2019-02-03 DIAGNOSIS — D509 Iron deficiency anemia, unspecified: Secondary | ICD-10-CM

## 2019-02-03 HISTORY — DX: Iron deficiency anemia, unspecified: D50.9

## 2019-02-05 ENCOUNTER — Other Ambulatory Visit: Payer: Self-pay

## 2019-02-06 ENCOUNTER — Other Ambulatory Visit: Payer: Self-pay

## 2019-02-06 ENCOUNTER — Inpatient Hospital Stay: Payer: Medicare HMO | Attending: Oncology

## 2019-02-06 VITALS — BP 109/63 | HR 79 | Temp 99.0°F | Resp 20

## 2019-02-06 DIAGNOSIS — D500 Iron deficiency anemia secondary to blood loss (chronic): Secondary | ICD-10-CM

## 2019-02-06 DIAGNOSIS — K922 Gastrointestinal hemorrhage, unspecified: Secondary | ICD-10-CM | POA: Diagnosis present

## 2019-02-06 MED ORDER — SODIUM CHLORIDE 0.9 % IV SOLN: 200.0000 mg | Freq: Once | INTRAVENOUS | Status: DC

## 2019-02-06 MED ORDER — SODIUM CHLORIDE 0.9 % IV SOLN
Freq: Once | INTRAVENOUS | Status: AC
  Administered 2019-02-06: 14:00:00 via INTRAVENOUS
  Filled 2019-02-06: qty 250

## 2019-02-06 MED ORDER — IRON SUCROSE 20 MG/ML IV SOLN
200.0000 mg | Freq: Once | INTRAVENOUS | Status: AC
  Administered 2019-02-06: 200 mg via INTRAVENOUS
  Filled 2019-02-06: qty 10

## 2019-02-12 ENCOUNTER — Other Ambulatory Visit: Payer: Self-pay

## 2019-02-13 ENCOUNTER — Other Ambulatory Visit: Payer: Self-pay

## 2019-02-13 ENCOUNTER — Inpatient Hospital Stay: Payer: Medicare HMO

## 2019-02-13 VITALS — BP 117/64 | HR 75 | Temp 97.2°F | Resp 20

## 2019-02-13 DIAGNOSIS — D500 Iron deficiency anemia secondary to blood loss (chronic): Secondary | ICD-10-CM

## 2019-02-13 DIAGNOSIS — K922 Gastrointestinal hemorrhage, unspecified: Secondary | ICD-10-CM | POA: Diagnosis not present

## 2019-02-13 MED ORDER — SODIUM CHLORIDE 0.9 % IV SOLN: 200.0000 mg | Freq: Once | INTRAVENOUS | Status: DC

## 2019-02-13 MED ORDER — IRON SUCROSE 20 MG/ML IV SOLN
200.0000 mg | Freq: Once | INTRAVENOUS | Status: AC
  Administered 2019-02-13: 200 mg via INTRAVENOUS
  Filled 2019-02-13: qty 10

## 2019-02-13 MED ORDER — SODIUM CHLORIDE 0.9 % IV SOLN
Freq: Once | INTRAVENOUS | Status: AC
  Administered 2019-02-13: 14:00:00 via INTRAVENOUS
  Filled 2019-02-13: qty 250

## 2019-02-20 ENCOUNTER — Other Ambulatory Visit: Payer: Self-pay

## 2019-02-20 ENCOUNTER — Inpatient Hospital Stay: Payer: Medicare HMO

## 2019-02-20 VITALS — BP 130/64 | HR 74 | Temp 98.0°F | Resp 20

## 2019-02-20 DIAGNOSIS — D500 Iron deficiency anemia secondary to blood loss (chronic): Secondary | ICD-10-CM

## 2019-02-20 DIAGNOSIS — K922 Gastrointestinal hemorrhage, unspecified: Secondary | ICD-10-CM | POA: Diagnosis not present

## 2019-02-20 MED ORDER — IRON SUCROSE 20 MG/ML IV SOLN
200.0000 mg | Freq: Once | INTRAVENOUS | Status: AC
  Administered 2019-02-20: 14:00:00 200 mg via INTRAVENOUS
  Filled 2019-02-20: qty 10

## 2019-02-20 MED ORDER — SODIUM CHLORIDE 0.9 % IV SOLN: 200.0000 mg | Freq: Once | INTRAVENOUS | Status: DC

## 2019-02-20 MED ORDER — SODIUM CHLORIDE 0.9 % IV SOLN
Freq: Once | INTRAVENOUS | Status: AC
  Administered 2019-02-20: 14:00:00 via INTRAVENOUS
  Filled 2019-02-20: qty 250

## 2019-02-27 ENCOUNTER — Other Ambulatory Visit: Payer: Self-pay

## 2019-02-27 ENCOUNTER — Inpatient Hospital Stay: Payer: Medicare HMO

## 2019-02-27 VITALS — BP 131/72 | HR 59 | Temp 98.0°F | Resp 18

## 2019-02-27 DIAGNOSIS — K922 Gastrointestinal hemorrhage, unspecified: Secondary | ICD-10-CM | POA: Diagnosis not present

## 2019-02-27 DIAGNOSIS — D500 Iron deficiency anemia secondary to blood loss (chronic): Secondary | ICD-10-CM

## 2019-02-27 MED ORDER — SODIUM CHLORIDE 0.9 % IV SOLN
Freq: Once | INTRAVENOUS | Status: AC
  Administered 2019-02-27: 14:00:00 via INTRAVENOUS
  Filled 2019-02-27: qty 250

## 2019-02-27 MED ORDER — IRON SUCROSE 20 MG/ML IV SOLN
200.0000 mg | Freq: Once | INTRAVENOUS | Status: AC
  Administered 2019-02-27: 14:00:00 200 mg via INTRAVENOUS
  Filled 2019-02-27: qty 10

## 2019-02-27 MED ORDER — SODIUM CHLORIDE 0.9 % IV SOLN: 200.0000 mg | Freq: Once | INTRAVENOUS | Status: DC

## 2019-02-27 NOTE — Progress Notes (Signed)
Pt tolerated infusion well. Pt and VS stable at discharge.  

## 2019-03-12 ENCOUNTER — Other Ambulatory Visit: Payer: Self-pay

## 2019-03-12 DIAGNOSIS — D500 Iron deficiency anemia secondary to blood loss (chronic): Secondary | ICD-10-CM

## 2019-03-13 ENCOUNTER — Inpatient Hospital Stay: Payer: Medicare HMO | Attending: Oncology

## 2019-03-13 ENCOUNTER — Other Ambulatory Visit: Payer: Self-pay

## 2019-03-13 DIAGNOSIS — D500 Iron deficiency anemia secondary to blood loss (chronic): Secondary | ICD-10-CM

## 2019-03-13 DIAGNOSIS — D509 Iron deficiency anemia, unspecified: Secondary | ICD-10-CM | POA: Diagnosis not present

## 2019-03-13 LAB — CBC WITH DIFFERENTIAL/PLATELET
Abs Immature Granulocytes: 0.04 10*3/uL (ref 0.00–0.07)
Basophils Absolute: 0.1 10*3/uL (ref 0.0–0.1)
Basophils Relative: 1 %
Eosinophils Absolute: 0.2 10*3/uL (ref 0.0–0.5)
Eosinophils Relative: 3 %
HCT: 32.1 % — ABNORMAL LOW (ref 36.0–46.0)
Hemoglobin: 9.7 g/dL — ABNORMAL LOW (ref 12.0–15.0)
Immature Granulocytes: 1 %
Lymphocytes Relative: 22 %
Lymphs Abs: 1.5 10*3/uL (ref 0.7–4.0)
MCH: 25.1 pg — ABNORMAL LOW (ref 26.0–34.0)
MCHC: 30.2 g/dL (ref 30.0–36.0)
MCV: 82.9 fL (ref 80.0–100.0)
Monocytes Absolute: 0.4 10*3/uL (ref 0.1–1.0)
Monocytes Relative: 6 %
Neutro Abs: 4.6 10*3/uL (ref 1.7–7.7)
Neutrophils Relative %: 67 %
Platelets: 132 10*3/uL — ABNORMAL LOW (ref 150–400)
RBC: 3.87 MIL/uL (ref 3.87–5.11)
RDW: 20.5 % — ABNORMAL HIGH (ref 11.5–15.5)
WBC: 6.9 10*3/uL (ref 4.0–10.5)
nRBC: 0 % (ref 0.0–0.2)

## 2019-03-13 LAB — IRON AND TIBC
Iron: 30 ug/dL (ref 28–170)
Saturation Ratios: 11 % (ref 10.4–31.8)
TIBC: 283 ug/dL (ref 250–450)
UIBC: 253 ug/dL

## 2019-03-13 LAB — FERRITIN: Ferritin: 190 ng/mL (ref 11–307)

## 2019-03-13 NOTE — Progress Notes (Signed)
Patient prescreened for appointment. No concerns voiced.  

## 2019-03-16 ENCOUNTER — Other Ambulatory Visit: Payer: Self-pay

## 2019-03-16 ENCOUNTER — Inpatient Hospital Stay (HOSPITAL_BASED_OUTPATIENT_CLINIC_OR_DEPARTMENT_OTHER): Payer: Medicare HMO | Admitting: Oncology

## 2019-03-16 ENCOUNTER — Encounter: Payer: Self-pay | Admitting: Oncology

## 2019-03-16 ENCOUNTER — Inpatient Hospital Stay: Payer: Medicare HMO

## 2019-03-16 VITALS — BP 136/72 | HR 70 | Temp 98.0°F | Resp 18 | Wt 197.0 lb

## 2019-03-16 VITALS — BP 128/71 | HR 67 | Resp 18

## 2019-03-16 DIAGNOSIS — D509 Iron deficiency anemia, unspecified: Secondary | ICD-10-CM | POA: Diagnosis not present

## 2019-03-16 DIAGNOSIS — D500 Iron deficiency anemia secondary to blood loss (chronic): Secondary | ICD-10-CM

## 2019-03-16 DIAGNOSIS — N1831 Chronic kidney disease, stage 3a: Secondary | ICD-10-CM

## 2019-03-16 DIAGNOSIS — D631 Anemia in chronic kidney disease: Secondary | ICD-10-CM | POA: Diagnosis not present

## 2019-03-16 MED ORDER — SODIUM CHLORIDE 0.9 % IV SOLN
Freq: Once | INTRAVENOUS | Status: AC
  Administered 2019-03-16: 15:00:00 via INTRAVENOUS
  Filled 2019-03-16: qty 250

## 2019-03-16 MED ORDER — SODIUM CHLORIDE 0.9 % IV SOLN: 200.0000 mg | Freq: Once | INTRAVENOUS | Status: DC

## 2019-03-16 MED ORDER — IRON SUCROSE 20 MG/ML IV SOLN
200.0000 mg | Freq: Once | INTRAVENOUS | Status: AC
  Administered 2019-03-16: 15:00:00 200 mg via INTRAVENOUS
  Filled 2019-03-16: qty 10

## 2019-03-16 NOTE — Progress Notes (Signed)
Hematology/Oncology Consult note [facility] Telephone:([phone] [phone]   Patient Care Team: Morning Sun as PCP - General Angelena Form, Annita Brod, MD as PCP - Cardiology (Cardiology) Elsie Stain, MD as Attending Physician (Pulmonary Disease) [name], MD as Consulting Physician (Cardiology) Gabriel Carina Betsey Holiday, MD as Physician Assistant (Internal Medicine) [name], MD as Consulting Physician (Cardiothoracic Surgery)  REFERRING PROVIDER: [place] CHIEF COMPLAINTS/REASON FOR VISIT:  Evaluation of iron deficiency anemia  HISTORY OF PRESENTING ILLNESS:  [name] is a  73 y.o.  female with PMH listed below was seen in consultation at the request of [place]   for evaluation of iron deficiency anemia.   Reviewed patient's recent labs  01/17/2019 labs revealed anemia with hemoglobin of 8.5, MCV 75.3. Reviewed patient's previous labs ordered by primary care physician's office, anemia is chronic onset , duration is since 2009.  Patient presented to emergency room on 01/17/2019 for evaluation of fatigue, patient is currently on a 10-day course of doxycycline prescribed by emergency room. She was also recently admitted between 01/07/2019 - 01/08/2019 due to parotitis submandibular gland inflammation.  Received a Decadron and Unasyn.  During that admission, patient received IV Venofer treatments.   Associated signs and symptoms: Patient reports fatigue.  Associated with SOB with exertion.  Denies weight loss, easy bruising, hematochezia, hemoptysis, hematuria. Context:  History of iron deficiency: History of iron deficiency. Rectal bleeding: Denies any blood in the stool. Patient had a history of GI bleeding. Last endoscopy: 11/08/2012 colonoscopy a single solitary 12 mm ulcer was found in the descending colon.  A lot of blood suctioned from colon.  Some bleeding was present.  Stigmata of recent  bleeding was present.  Area was successfully injected with 6 mL of a 1-10,000 solution of epinephrine for drug delivery.  1 large hemostatic clip was successfully placed.  Single large mouth diverticulum was found in the sigmoid colon.  Area was successfully injected with 3 mils of 1-10,000 solution of epinephrine.  Benign polyps were removed. 01/28/2013 EGD by Dr. Jamal Collin showed erythematous mucosa in the antrum.  Esophageal.  Mucosal changes suspicious for Barrett esophagus.  Biopsy showed antral mucosa with moderate reactive gastropathy.  No intestinal metaplasia, dysplasia or malignancy.  No H. pylori.  GE junction biopsy showed stratified squamous epithelium without inflammation or reactive changes.  And a single tiny focus of columnar epithelium most compatible with an esophageal clot.  No interstitial metaplasia or dysplasia or malignancy.   Hematemesis or hemoptysis : denies Blood in urine : denies   Fatigue: Yes.  SOB: Yes  Also reports easy bruising and bleeding.  She check her blood sugar every day and reports blood dripping from the small lancet needle hole.  Denies any gum bleeding, nosebleeding or blood in the stool.   INTERVAL HISTORY [name] is a 73 y.o. female who has above history reviewed by me today presents for follow up visit for management of iron deficiency anemia.  Problems and complaints are listed below: She is s/p IV venofer weekly x 4. Reports still feels tired and fatigue, she has not felt any dramatic improvement.  Denies weight loss, fever, chills, fatigue, night sweats.  Denies fever, chills, nausea, vomiting, diarrhea, chest pain, abdominal pain, urinary symptoms, lower extremity swelling.  Chronic SOB, at baseline.    Review of Systems  Constitutional: Positive for fatigue. Negative for appetite change, chills and fever.  HENT:   Negative for hearing loss and  voice change.   Eyes: Negative for eye problems.  Respiratory: Positive for shortness of  breath. Negative for chest tightness and cough.   Cardiovascular: Negative for chest pain.  Gastrointestinal: Negative for abdominal distention, abdominal pain and blood in stool.  Endocrine: Negative for hot flashes.  Genitourinary: Negative for difficulty urinating and frequency.   Musculoskeletal: Negative for arthralgias.  Skin: Negative for itching and rash.  Neurological: Negative for extremity weakness.  Hematological: Negative for adenopathy.  Psychiatric/Behavioral: Negative for confusion.    MEDICAL HISTORY:  Past Medical History:  Diagnosis Date  . 1st degree AV block   . ACE-inhibitor cough   . Allergic rhinitis   . Anemia    iron deficiency anemia  . Anxiety   . Aortic ectasia (HCC)    a. CT abd in 12/2016 incidentally noted aortic atherosclerosis and infrarenal abdominal aortic ectasia measuring as large as 2.7 cm with recommendation to repeat US in 2023.  Marland Kitchen Arthritis   . Asthma   . Cataract   . Chronic depression   . Chronic diastolic CHF (congestive heart failure) ([place])   . Chronic headache   . COPD (chronic obstructive pulmonary disease) ([place])   . Coronary artery disease    a. DES to RCA and mid Cx 2009. b. CABG and bioprosthetic AVR May 2015. c. cutting balloon to prox Cx in 05/2016  . Diabetes mellitus    type 2  . Diverticulitis of colon   . Essential hypertension   . GERD (gastroesophageal reflux disease)   . Hearing loss   . History of blood transfusion 2013  . History of prosthetic aortic valve replacement   . HOH (hard of hearing)   . Hypercholesterolemia    intolerance of statins and niaspan  . IDA (iron deficiency anemia) 02/03/2019  . Mobitz type 1 second degree AV block   . OSA (obstructive sleep apnea)    mild, intolerant of cpap  . PAD (peripheral artery disease) ([place])    a. atherosclerosis by CT abd 12/2016 in [place].  Marland Kitchen PONV (postoperative nausea and vomiting)   . Statin intolerance   . Thyroid disease     SURGICAL HISTORY: Past Surgical  History:  Procedure Laterality Date  . ABDOMINAL HYSTERECTOMY    . ABDOMINAL HYSTERECTOMY W/ PARTIAL VAGINACTOMY    . AORTIC VALVE REPLACEMENT N/A 10/12/2013   Procedure: AORTIC VALVE REPLACEMENT (AVR);  Surgeon: [name], MD;  Location: [facility];  Service: Open Heart Surgery;  Laterality: N/A;  . [place]  . BARTHOLIN GLAND CYST EXCISION    . BLADDER SUSPENSION    . BREAST BIOPSY Bilateral 09/11/2000   neg  . BREAST BIOPSY Left 07/24/2010   neg  . BREAST CYST EXCISION  1988   bilateral nonmalignant tumors, x3  . CARDIAC CATHETERIZATION    . CARDIAC CATHETERIZATION N/A 05/25/2016   Procedure: Coronary Balloon Angioplasty;  Surgeon: Leonie Man, MD;  Location: [facility];  Service: Cardiovascular;  Laterality: N/A;  . CARDIAC CATHETERIZATION N/A 05/25/2016   Procedure: Coronary/Graft Angiography;  Surgeon: Leonie Man, MD;  Location: [facility];  Service: Cardiovascular;  Laterality: N/A;  . CATARACT EXTRACTION W/ INTRAOCULAR LENS  IMPLANT, BILATERAL    . CHOLECYSTECTOMY  2001  . COLECTOMY     lap sigmoid  . COLONOSCOPY  2014   polyps found, 2 clamped off.  . CORONARY ANGIOPLASTY  10/29/2007   Prox RCA & Mid Cx.  . CORONARY ARTERY BYPASS GRAFT N/A 10/12/2013  Procedure: CORONARY ARTERY BYPASS GRAFT TIMES TWO;  Surgeon: [name], MD;  Location: [facility];  Service: Open Heart Surgery;  Laterality: N/A;  . CORONARY/GRAFT ANGIOGRAPHY N/A 09/20/2017   Procedure: CORONARY/GRAFT ANGIOGRAPHY;  Surgeon: Sherren Mocha, MD;  Location: [facility];  Service: Cardiovascular;  Laterality: N/A;  . LEFT HEART CATHETERIZATION WITH CORONARY ANGIOGRAM N/A 10/09/2013   Procedure: LEFT HEART CATHETERIZATION WITH CORONARY ANGIOGRAM;  Surgeon: [name], MD;  Location: [facility] C[facility];  Service: Cardiovascular;  Laterality: N/A;  . STERNAL WIRES REMOVAL N/A 04/13/2014   Procedure: STERNAL WIRES REMOVAL;  Surgeon: [name], MD;  Location: [facility];   Service: Thoracic;  Laterality: N/A;  . THYROIDECTOMY    . TONSILLECTOMY    . TUBAL LIGATION    . VAGINAL DELIVERY     3  . VISCERAL ARTERY INTERVENTION N/A 08/16/2016   Procedure: Visceral Artery Intervention;  Surgeon: Algernon Huxley, MD;  Location: [facility];  Service: Cardiovascular;  Laterality: N/A;    SOCIAL HISTORY: Social History   Socioeconomic History  . Marital status: Married    Spouse name: Not on file  . Number of children: 3  . Years of education: Not on file  . Highest education level: Not on file  Occupational History  . Occupation: Retired    Fish farm manager: UNEMPLOYED    Comment: CNA  Social Needs  . Financial resource strain: Not on file  . Food insecurity    Worry: Not on file    Inability: Not on file  . Transportation needs    Medical: Not on file    Non-medical: Not on file  Tobacco Use  . Smoking status: Former Smoker    Packs/day: 0.50    Years: 30.00    Pack years: 15.00    Types: Cigarettes    Quit date: 10/02/2013    Years since quitting: 5.4  . Smokeless tobacco: Never Used  Substance and Sexual Activity  . Alcohol use: No  . Drug use: No  . Sexual activity: Not Currently  Lifestyle  . Physical activity    Days per week: Not on file    Minutes per session: Not on file  . Stress: Not on file  Relationships  . Social Herbalist on phone: Not on file    Gets together: Not on file    Attends religious service: Not on file    Active member of club or organization: Not on file    Attends meetings of clubs or organizations: Not on file    Relationship status: Not on file  . Intimate partner violence    Fear of current or ex partner: Not on file    Emotionally abused: Not on file    Physically abused: Not on file    Forced sexual activity: Not on file  Other Topics Concern  . Not on file  Social History Narrative   Does not have Living Will   Desires CPR, would not want prolonged life support if futile.    FAMILY  HISTORY: Family History  Problem Relation Age of Onset  . Breast cancer Mother 35  . Hypertension Father   . Mesothelioma Father   . Asthma Father   . Stroke Paternal Grandfather   . Heart disease Other   . Breast cancer Maternal Aunt   . Breast cancer Paternal Aunt     ALLERGIES:  is allergic to amitriptyline; benadryl [diphenhydramine]; demerol [meperidine]; gabapentin; loratadine; meperidine  hcl; mirtazapine; olanzapine; voltaren [diclofenac sodium]; zetia [ezetimibe]; ativan [lorazepam]; atorvastatin; budesonide-formoterol fumarate; bupropion hcl; caffeine; codeine sulfate; lisinopril; metformin; mometasone furoate; morphine sulfate; other; oxycodone-acetaminophen; pioglitazone; propoxyphene n-acetaminophen; rosuvastatin; shellfish allergy; suvorexant; ticagrelor; tramadol; trazodone and nefazodone; venlafaxine; zolpidem tartrate; and latex.  MEDICATIONS:  Current Outpatient Medications  Medication Sig Dispense Refill  . albuterol (PROVENTIL) (2.5 MG/3ML) 0.083% nebulizer solution Inhale into the lungs.    . ARIPiprazole (ABILIFY) 2 MG tablet Take 2 mg by mouth daily.    . budesonide (PULMICORT) 0.5 MG/2ML nebulizer solution Take 0.5 mg by nebulization daily.     . Calcium-Vitamin D (CALTRATE 600 PLUS-VIT D PO) Take 1 tablet by mouth 2 (two) times daily.     . citalopram (CELEXA) 20 MG tablet Take 20 mg by mouth daily.    . furosemide (LASIX) 20 MG tablet Take 20 mg by mouth daily as needed for fluid or edema.     . insulin NPH-regular Human (NOVOLIN 70/30) (70-30) 100 UNIT/ML injection Inject 75-95 Units into the skin See admin instructions. Inject 95u under the skin every morning at breakfast and inject 75u under the skin every evening with supper    . levothyroxine (SYNTHROID, LEVOTHROID) 125 MCG tablet Take 125 mcg by mouth daily.     . metFORMIN (GLUCOPHAGE) 1000 MG tablet Take 1,000 mg by mouth 2 (two) times daily with a meal.    . nitroGLYCERIN (NITROSTAT) 0.4 MG SL tablet Place  1 tablet (0.4 mg total) under the tongue every 5 (five) minutes as needed for chest pain. 25 tablet 1  . olmesartan (BENICAR) 20 MG tablet Take 20 mg by mouth at bedtime.    . ondansetron (ZOFRAN ODT) 4 MG disintegrating tablet Take 1 tablet (4 mg total) by mouth every 6 (six) hours as needed for nausea or vomiting. 20 tablet 0  . pantoprazole (PROTONIX) 20 MG tablet Take 20 mg by mouth daily.    . promethazine (PHENERGAN) 25 MG tablet Take 25 mg by mouth every 6 (six) hours as needed for nausea or vomiting.    . ranolazine (RANEXA) 500 MG 12 hr tablet Take 1 tablet (500 mg total) by mouth 2 (two) times daily. 60 tablet 6  . traZODone (DESYREL) 100 MG tablet Take 100 mg by mouth at bedtime.     Marland Kitchen doxycycline (VIBRAMYCIN) 100 MG capsule Take 1 capsule (100 mg total) by mouth 2 (two) times daily. (Patient not taking: Reported on 03/13/2019) 20 capsule 0   Current Facility-Administered Medications  Medication Dose Route Frequency Provider Last Rate Last Dose  . umeclidinium bromide (INCRUSE ELLIPTA) 62.5 MCG/INH 1 puff  1 puff Inhalation Daily Kasa, Kurian, MD         PHYSICAL EXAMINATION: ECOG PERFORMANCE STATUS: 2 - Symptomatic, <50% confined to bed Vitals:   03/16/19 1329  BP: 136/72  Pulse: 70  Resp: 18  Temp: 98 F (36.7 C)  SpO2: 97%   Filed Weights   03/16/19 1329  Weight: 197 lb (89.4 kg)   Physical Exam Constitutional:      General: She is not in acute distress.    Appearance: She is obese. She is ill-appearing.     Comments: Sitting in the wheelchair.  HENT:     Head: Normocephalic and atraumatic.  Eyes:     General: No scleral icterus.    Pupils: Pupils are equal, round, and reactive to light.  Neck:     Musculoskeletal: Normal range of motion and neck supple.  Cardiovascular:  Rate and Rhythm: Normal rate and regular rhythm.     Heart sounds: Murmur present.  Pulmonary:     Effort: Pulmonary effort is normal. No respiratory distress.     Breath sounds: No  wheezing.  Abdominal:     General: Bowel sounds are normal. There is no distension.     Palpations: Abdomen is soft. There is no mass.     Tenderness: There is no abdominal tenderness.  Musculoskeletal: Normal range of motion.        General: Swelling present. No deformity.  Skin:    General: Skin is warm and dry.     Coloration: Skin is not pale.     Findings: No erythema or rash.  Neurological:     Mental Status: She is alert and oriented to person, place, and time.     Cranial Nerves: No cranial nerve deficit.     Coordination: Coordination normal.  Psychiatric:        Behavior: Behavior normal.        Thought Content: Thought content normal.       CMP Latest Ref Rng & Units 01/30/2019  Glucose 70 - 99 mg/dL 149(H)  BUN 8 - 23 mg/dL 17  Creatinine 0.44 - 1.00 mg/dL 1.09(H)  Sodium 135 - 145 mmol/L 139  Potassium 3.5 - 5.1 mmol/L 4.6  Chloride 98 - 111 mmol/L 109  CO2 22 - 32 mmol/L 20(L)  Calcium 8.9 - 10.3 mg/dL 8.3(L)  Total Protein 6.5 - 8.1 g/dL 7.0  Total Bilirubin 0.3 - 1.2 mg/dL 0.4  Alkaline Phos 38 - 126 U/L 74  AST 15 - 41 U/L 30  ALT 0 - 44 U/L 18   CBC Latest Ref Rng & Units 03/13/2019  WBC 4.0 - 10.5 K/uL 6.9  Hemoglobin 12.0 - 15.0 g/dL 9.7(L)  Hematocrit 36.0 - 46.0 % 32.1(L)  Platelets 150 - 400 K/uL 132(L)     LABORATORY DATA:  I have reviewed the data as listed Lab Results  Component Value Date   WBC 6.9 03/13/2019   HGB 9.7 (L) 03/13/2019   HCT 32.1 (L) 03/13/2019   MCV 82.9 03/13/2019   PLT 132 (L) 03/13/2019   Recent Labs    01/07/19 2306 01/17/19 1436 01/30/19 1021  NA 136 133* 139  K 4.9 4.8 4.6  CL 105 101 109  CO2 22 22 20*  GLUCOSE 133* 283* 149*  BUN 17 14 17   CREATININE 0.98 1.07* 1.09*  CALCIUM 8.3* 8.4* 8.3*  GFRNONAA 57* 51* 50*  GFRAA >60 59* 58*  PROT  --   --  7.0  ALBUMIN  --   --  3.2*  AST  --   --  30  ALT  --   --  18  ALKPHOS  --   --  74  BILITOT  --   --  0.4   Iron/TIBC/Ferritin/ %Sat     Component Value Date/Time   IRON 30 03/13/2019 1305   IRON 41 (L) 03/31/2014 1417   TIBC 283 03/13/2019 1305   TIBC 264 03/31/2014 1417   FERRITIN 190 03/13/2019 1305   FERRITIN 406 (H) 03/31/2014 1417   IRONPCTSAT 11 03/13/2019 1305   IRONPCTSAT 16 03/31/2014 1417     No results found.    ASSESSMENT & PLAN:  1. Iron deficiency anemia due to chronic blood loss   2. Anemia due to stage 3a chronic kidney disease   Labs are reviewed and discussed with patient. S/p IV Venofer weekly x  4.  Iron panel has improved.  Hemoglobin has improved to 9.7.  Discussed in details with patient that I recommend to further improve her ferritin/iron store to be over 200.  Proceed with another dose of IV venofer today.  Repeat blood work in 2 months. If hemoglobin persistently <10, she may benefit from erythropoitein replacement.  Discussed with patient about the rationale of erythropoietin therapy and side effects including not limited to thrombosis events, stroke, heart attack, progression of underlying cancer, etc. Patient understands and willing to proceed if needed in the future.     Orders Placed This Encounter  Procedures  . CBC with Differential/Platelet    Standing Status:   Future    Standing Expiration Date:   03/15/2020  . Ferritin    Standing Status:   Future    Standing Expiration Date:   03/15/2020  . Iron and TIBC    Standing Status:   Future    Standing Expiration Date:   03/15/2020    All questions were answered. The patient knows to call the clinic with any problems questions or concerns.  [place]  Return of visit: 8 weeks We spent sufficient time to discuss many aspect of care, questions were answered to patient's satisfaction. Total face to face encounter time for this patient visit was 25 min. >50% of the time was  spent in counseling and coordination of care.    Earlie Server, MD, PhD Hematology Oncology [facility]  Pager- [phone] 03/16/2019

## 2019-05-11 ENCOUNTER — Other Ambulatory Visit: Payer: Self-pay | Admitting: Oncology

## 2019-05-13 ENCOUNTER — Telehealth: Payer: Self-pay | Admitting: *Deleted

## 2019-05-13 NOTE — Telephone Encounter (Signed)
Per 05/13/19 staff message to R/S pts 05/18/19 MD/Retacrtit appt to later in the week and inform patient Due to insurance auth.  A detailed message was left on pts vmail making her aware that her 12/14 MD/Inj appts were moved out to 05/21/19 due to pending auth.

## 2019-05-14 ENCOUNTER — Other Ambulatory Visit: Payer: Self-pay

## 2019-05-15 ENCOUNTER — Other Ambulatory Visit: Payer: Self-pay

## 2019-05-15 ENCOUNTER — Inpatient Hospital Stay: Payer: Medicare HMO | Attending: Oncology

## 2019-05-15 DIAGNOSIS — D631 Anemia in chronic kidney disease: Secondary | ICD-10-CM | POA: Diagnosis present

## 2019-05-15 DIAGNOSIS — D500 Iron deficiency anemia secondary to blood loss (chronic): Secondary | ICD-10-CM

## 2019-05-15 DIAGNOSIS — N183 Chronic kidney disease, stage 3 unspecified: Secondary | ICD-10-CM | POA: Diagnosis not present

## 2019-05-15 LAB — CBC WITH DIFFERENTIAL/PLATELET
Abs Immature Granulocytes: 0.03 10*3/uL (ref 0.00–0.07)
Basophils Absolute: 0.1 10*3/uL (ref 0.0–0.1)
Basophils Relative: 1 %
Eosinophils Absolute: 0.3 10*3/uL (ref 0.0–0.5)
Eosinophils Relative: 4 %
HCT: 32.8 % — ABNORMAL LOW (ref 36.0–46.0)
Hemoglobin: 10 g/dL — ABNORMAL LOW (ref 12.0–15.0)
Immature Granulocytes: 0 %
Lymphocytes Relative: 22 %
Lymphs Abs: 1.8 10*3/uL (ref 0.7–4.0)
MCH: 27.1 pg (ref 26.0–34.0)
MCHC: 30.5 g/dL (ref 30.0–36.0)
MCV: 88.9 fL (ref 80.0–100.0)
Monocytes Absolute: 0.5 10*3/uL (ref 0.1–1.0)
Monocytes Relative: 7 %
Neutro Abs: 5.3 10*3/uL (ref 1.7–7.7)
Neutrophils Relative %: 66 %
Platelets: 169 10*3/uL (ref 150–400)
RBC: 3.69 MIL/uL — ABNORMAL LOW (ref 3.87–5.11)
RDW: 14.3 % (ref 11.5–15.5)
WBC: 8 10*3/uL (ref 4.0–10.5)
nRBC: 0 % (ref 0.0–0.2)

## 2019-05-15 LAB — IRON AND TIBC
Iron: 46 ug/dL (ref 28–170)
Saturation Ratios: 13 % (ref 10.4–31.8)
TIBC: 344 ug/dL (ref 250–450)
UIBC: 298 ug/dL

## 2019-05-15 LAB — FERRITIN: Ferritin: 107 ng/mL (ref 11–307)

## 2019-05-18 ENCOUNTER — Inpatient Hospital Stay: Payer: Medicare HMO

## 2019-05-18 ENCOUNTER — Inpatient Hospital Stay: Payer: Medicare HMO | Admitting: Oncology

## 2019-05-21 ENCOUNTER — Other Ambulatory Visit: Payer: Self-pay

## 2019-05-21 ENCOUNTER — Encounter: Payer: Self-pay | Admitting: Oncology

## 2019-05-21 ENCOUNTER — Inpatient Hospital Stay (HOSPITAL_BASED_OUTPATIENT_CLINIC_OR_DEPARTMENT_OTHER): Payer: Medicare HMO | Admitting: Oncology

## 2019-05-21 ENCOUNTER — Inpatient Hospital Stay: Payer: Medicare HMO

## 2019-05-21 VITALS — BP 122/71 | HR 78 | Temp 99.1°F | Resp 18

## 2019-05-21 VITALS — BP 125/71 | HR 72 | Temp 98.2°F | Resp 18

## 2019-05-21 DIAGNOSIS — D500 Iron deficiency anemia secondary to blood loss (chronic): Secondary | ICD-10-CM

## 2019-05-21 DIAGNOSIS — N183 Chronic kidney disease, stage 3 unspecified: Secondary | ICD-10-CM | POA: Diagnosis not present

## 2019-05-21 DIAGNOSIS — N1831 Chronic kidney disease, stage 3a: Secondary | ICD-10-CM | POA: Insufficient documentation

## 2019-05-21 DIAGNOSIS — R5383 Other fatigue: Secondary | ICD-10-CM | POA: Diagnosis not present

## 2019-05-21 DIAGNOSIS — D631 Anemia in chronic kidney disease: Secondary | ICD-10-CM | POA: Diagnosis not present

## 2019-05-21 MED ORDER — IRON SUCROSE 20 MG/ML IV SOLN
200.0000 mg | Freq: Once | INTRAVENOUS | Status: AC
  Administered 2019-05-21: 200 mg via INTRAVENOUS
  Filled 2019-05-21: qty 10

## 2019-05-21 MED ORDER — SODIUM CHLORIDE 0.9 % IV SOLN: 200.0000 mg | Freq: Once | INTRAVENOUS | Status: DC

## 2019-05-21 MED ORDER — SODIUM CHLORIDE 0.9 % IV SOLN
Freq: Once | INTRAVENOUS | Status: AC
  Filled 2019-05-21: qty 250

## 2019-05-21 NOTE — Progress Notes (Signed)
Patient reports feeling tired and weak.

## 2019-05-21 NOTE — Progress Notes (Signed)
Hematology/Oncology Consult note [facility] Telephone:([phone] [phone]   Patient Care Team: [place] as PCP - General Angelena Form, Annita Brod, MD as PCP - Cardiology (Cardiology) Elsie Stain, MD as Attending Physician (Pulmonary Disease) [name], MD as Consulting Physician (Cardiology) Gabriel Carina Betsey Holiday, MD as Physician Assistant (Internal Medicine) [name], MD as Consulting Physician (Cardiothoracic Surgery)  REFERRING PROVIDER: [place] CHIEF COMPLAINTS/REASON FOR VISIT:  Evaluation of iron deficiency anemia  HISTORY OF PRESENTING ILLNESS:  [name] is a  73 y.o.  female with PMH listed below was seen in consultation at the request of [name]   for evaluation of iron deficiency anemia.   Reviewed patient's recent labs  01/17/2019 labs revealed anemia with hemoglobin of 8.5, MCV 75.3. Reviewed patient's previous labs ordered by primary care physician's office, anemia is chronic onset , duration is since 2009.  Patient presented to emergency room on 01/17/2019 for evaluation of fatigue, patient is currently on a 10-day course of doxycycline prescribed by emergency room. She was also recently admitted between 01/07/2019 - 01/08/2019 due to parotitis submandibular gland inflammation.  Received a Decadron and Unasyn.  During that admission, patient received IV Venofer treatments.   Associated signs and symptoms: Patient reports fatigue.  Associated with SOB with exertion.  Denies weight loss, easy bruising, hematochezia, hemoptysis, hematuria. Context:  History of iron deficiency: History of iron deficiency. Rectal bleeding: Denies any blood in the stool. Patient had a history of GI bleeding. Last endoscopy: 11/08/2012 colonoscopy a single solitary 12 mm ulcer was found in the descending colon.  A lot of blood suctioned from colon.  Some bleeding was present.  Stigmata of recent  bleeding was present.  Area was successfully injected with 6 mL of a 1-10,000 solution of epinephrine for drug delivery.  1 large hemostatic clip was successfully placed.  Single large mouth diverticulum was found in the sigmoid colon.  Area was successfully injected with 3 mils of 1-10,000 solution of epinephrine.  Benign polyps were removed. 01/28/2013 EGD by Dr. Jamal Collin showed erythematous mucosa in the antrum.  Esophageal.  Mucosal changes suspicious for Barrett esophagus.  Biopsy showed antral mucosa with moderate reactive gastropathy.  No intestinal metaplasia, dysplasia or malignancy.  No H. pylori.  GE junction biopsy showed stratified squamous epithelium without inflammation or reactive changes.  And a single tiny focus of columnar epithelium most compatible with an esophageal clot.  No interstitial metaplasia or dysplasia or malignancy.    INTERVAL HISTORY [name] is a 73 y.o. female who has above history reviewed by me today presents for follow up visit for management of iron deficiency anemia.  Problems and complaints are listed below: Patient continues to feel tired and fatigued.  Chronic shortness of breath. Denies fever, chills, vomiting, nausea, diarrhea.     Review of Systems  Constitutional: Positive for fatigue. Negative for appetite change, chills and fever.  HENT:   Negative for hearing loss and voice change.   Eyes: Negative for eye problems.  Respiratory: Positive for shortness of breath. Negative for chest tightness and cough.   Cardiovascular: Negative for chest pain.  Gastrointestinal: Negative for abdominal distention, abdominal pain and blood in stool.  Endocrine: Negative for hot flashes.  Genitourinary: Negative for difficulty urinating and frequency.   Musculoskeletal: Negative for arthralgias.  Skin: Negative for itching and rash.  Neurological: Negative for extremity weakness.  Hematological: Negative for adenopathy.  Psychiatric/Behavioral: Negative for  confusion.  MEDICAL HISTORY:  Past Medical History:  Diagnosis Date  . 1st degree AV block   . ACE-inhibitor cough   . Allergic rhinitis   . Anemia    iron deficiency anemia  . Anxiety   . Aortic ectasia (HCC)    a. CT abd in 12/2016 incidentally noted aortic atherosclerosis and infrarenal abdominal aortic ectasia measuring as large as 2.7 cm with recommendation to repeat US in 2023.  Marland Kitchen Arthritis   . Asthma   . Cataract   . Chronic depression   . Chronic diastolic CHF (congestive heart failure) ([place])   . Chronic headache   . COPD (chronic obstructive pulmonary disease) ([place])   . Coronary artery disease    a. DES to RCA and mid Cx 2009. b. CABG and bioprosthetic AVR May 2015. c. cutting balloon to prox Cx in 05/2016  . Diabetes mellitus    type 2  . Diverticulitis of colon   . Essential hypertension   . GERD (gastroesophageal reflux disease)   . Hearing loss   . History of blood transfusion 2013  . History of prosthetic aortic valve replacement   . HOH (hard of hearing)   . Hypercholesterolemia    intolerance of statins and niaspan  . IDA (iron deficiency anemia) 02/03/2019  . Mobitz type 1 second degree AV block   . OSA (obstructive sleep apnea)    mild, intolerant of cpap  . PAD (peripheral artery disease) ([place])    a. atherosclerosis by CT abd 12/2016 in [place].  Marland Kitchen PONV (postoperative nausea and vomiting)   . Statin intolerance   . Thyroid disease     SURGICAL HISTORY: Past Surgical History:  Procedure Laterality Date  . ABDOMINAL HYSTERECTOMY    . ABDOMINAL HYSTERECTOMY W/ PARTIAL VAGINACTOMY    . AORTIC VALVE REPLACEMENT N/A 10/12/2013   Procedure: AORTIC VALVE REPLACEMENT (AVR);  Surgeon: [name], MD;  Location: [place];  Service: Open Heart Surgery;  Laterality: N/A;  . [place]  . BARTHOLIN GLAND CYST EXCISION    . BLADDER SUSPENSION    . BREAST BIOPSY Bilateral 09/11/2000   neg  . BREAST BIOPSY Left 07/24/2010   neg  . BREAST CYST EXCISION  1988    bilateral nonmalignant tumors, x3  . CARDIAC CATHETERIZATION    . CARDIAC CATHETERIZATION N/A 05/25/2016   Procedure: Coronary Balloon Angioplasty;  Surgeon: Leonie Man, MD;  Location: [place] [facility];  Service: Cardiovascular;  Laterality: N/A;  . CARDIAC CATHETERIZATION N/A 05/25/2016   Procedure: Coronary/Graft Angiography;  Surgeon: Leonie Man, MD;  Location: [place] [facility];  Service: Cardiovascular;  Laterality: N/A;  . CATARACT EXTRACTION W/ INTRAOCULAR LENS  IMPLANT, BILATERAL    . CHOLECYSTECTOMY  2001  . COLECTOMY     lap sigmoid  . COLONOSCOPY  2014   polyps found, 2 clamped off.  . CORONARY ANGIOPLASTY  10/29/2007   Prox RCA & Mid Cx.  . CORONARY ARTERY BYPASS GRAFT N/A 10/12/2013   Procedure: CORONARY ARTERY BYPASS GRAFT TIMES TWO;  Surgeon: [name], MD;  Location: [place];  Service: Open Heart Surgery;  Laterality: N/A;  . CORONARY/GRAFT ANGIOGRAPHY N/A 09/20/2017   Procedure: CORONARY/GRAFT ANGIOGRAPHY;  Surgeon: Sherren Mocha, MD;  Location: [place] [facility];  Service: Cardiovascular;  Laterality: N/A;  . LEFT HEART CATHETERIZATION WITH CORONARY ANGIOGRAM N/A 10/09/2013   Procedure: LEFT HEART CATHETERIZATION WITH CORONARY ANGIOGRAM;  Surgeon: [name], MD;  Location: [facility] CATH LAB;  Service: Cardiovascular;  Laterality: N/A;  . STERNAL WIRES REMOVAL N/A 04/13/2014   Procedure: STERNAL WIRES REMOVAL;  Surgeon: [name], MD;  Location: [facility];  Service: Thoracic;  Laterality: N/A;  . THYROIDECTOMY    . TONSILLECTOMY    . TUBAL LIGATION    . VAGINAL DELIVERY     3  . VISCERAL ARTERY INTERVENTION N/A 08/16/2016   Procedure: Visceral Artery Intervention;  Surgeon: Algernon Huxley, MD;  Location: [place] [facility];  Service: Cardiovascular;  Laterality: N/A;    SOCIAL HISTORY: Social History   Socioeconomic History  . Marital status: Married    Spouse name: Not on file  . Number of children: 3  . Years of education: Not on file    . Highest education level: Not on file  Occupational History  . Occupation: Retired    Fish farm manager: UNEMPLOYED    Comment: CNA  Tobacco Use  . Smoking status: Former Smoker    Packs/day: 0.50    Years: 30.00    Pack years: 15.00    Types: Cigarettes    Quit date: 10/02/2013    Years since quitting: 5.6  . Smokeless tobacco: Never Used  Substance and Sexual Activity  . Alcohol use: No  . Drug use: No  . Sexual activity: Not Currently  Other Topics Concern  . Not on file  Social History Narrative   Does not have Living Will   Desires CPR, would not want prolonged life support if futile.   Social Determinants of Health   Financial Resource Strain:   . Difficulty of Paying Living Expenses: Not on file  Food Insecurity:   . Worried About Charity fundraiser in the Last Year: Not on file  . Ran Out of Food in the Last Year: Not on file  Transportation Needs:   . Lack of Transportation (Medical): Not on file  . Lack of Transportation (Non-Medical): Not on file  Physical Activity:   . Days of Exercise per Week: Not on file  . Minutes of Exercise per Session: Not on file  Stress:   . Feeling of Stress : Not on file  Social Connections:   . Frequency of Communication with Friends and Family: Not on file  . Frequency of Social Gatherings with Friends and Family: Not on file  . Attends Religious Services: Not on file  . Active Member of Clubs or Organizations: Not on file  . Attends Archivist Meetings: Not on file  . Marital Status: Not on file  Intimate Partner Violence:   . Fear of Current or Ex-Partner: Not on file  . Emotionally Abused: Not on file  . Physically Abused: Not on file  . Sexually Abused: Not on file    FAMILY HISTORY: Family History  Problem Relation Age of Onset  . Breast cancer Mother 74  . Hypertension Father   . Mesothelioma Father   . Asthma Father   . Stroke Paternal Grandfather   . Heart disease Other   . Breast cancer Maternal Aunt    . Breast cancer Paternal Aunt     ALLERGIES:  is allergic to amitriptyline; benadryl [diphenhydramine]; demerol [meperidine]; gabapentin; loratadine; meperidine hcl; mirtazapine; olanzapine; voltaren [diclofenac sodium]; zetia [ezetimibe]; ativan [lorazepam]; atorvastatin; budesonide-formoterol fumarate; bupropion hcl; caffeine; codeine sulfate; lisinopril; metformin; mometasone furoate; morphine sulfate; other; oxycodone-acetaminophen; pioglitazone; propoxyphene n-acetaminophen; rosuvastatin; shellfish allergy; suvorexant; ticagrelor; tramadol; trazodone and nefazodone; venlafaxine; zolpidem tartrate; and latex.  MEDICATIONS:  Current Outpatient Medications  Medication Sig Dispense Refill  .  albuterol (PROVENTIL) (2.5 MG/3ML) 0.083% nebulizer solution Inhale into the lungs.    . ARIPiprazole (ABILIFY) 2 MG tablet Take 2 mg by mouth daily.    . budesonide (PULMICORT) 0.5 MG/2ML nebulizer solution Take 0.5 mg by nebulization daily.     . Calcium-Vitamin D (CALTRATE 600 PLUS-VIT D PO) Take 1 tablet by mouth 2 (two) times daily.     . citalopram (CELEXA) 20 MG tablet Take 20 mg by mouth daily.    . citalopram (CELEXA) 40 MG tablet Take 40 mg by mouth every morning.    . DULoxetine (CYMBALTA) 20 MG capsule TAKE 1 CAPSULE BY MOUTH ONCE DAILY FOR 7 DAYS AND THEN INCREASE TO 2 CAPSULES DAILY WITH BREAKFAST    . furosemide (LASIX) 20 MG tablet Take 20 mg by mouth daily as needed for fluid or edema.     . insulin NPH-regular Human (NOVOLIN 70/30) (70-30) 100 UNIT/ML injection Inject 75-95 Units into the skin See admin instructions. Inject 95u under the skin every morning at breakfast and inject 75u under the skin every evening with supper    . levothyroxine (SYNTHROID, LEVOTHROID) 125 MCG tablet Take 125 mcg by mouth daily.     . metFORMIN (GLUCOPHAGE) 1000 MG tablet Take 1,000 mg by mouth 2 (two) times daily with a meal.    . nitroGLYCERIN (NITROSTAT) 0.4 MG SL tablet Place 1 tablet (0.4 mg total) under  the tongue every 5 (five) minutes as needed for chest pain. 25 tablet 1  . olmesartan (BENICAR) 20 MG tablet Take 20 mg by mouth at bedtime.    . ondansetron (ZOFRAN ODT) 4 MG disintegrating tablet Take 1 tablet (4 mg total) by mouth every 6 (six) hours as needed for nausea or vomiting. 20 tablet 0  . pantoprazole (PROTONIX) 20 MG tablet Take 20 mg by mouth daily.    . promethazine (PHENERGAN) 25 MG tablet Take 25 mg by mouth every 6 (six) hours as needed for nausea or vomiting.    . ranolazine (RANEXA) 500 MG 12 hr tablet Take 1 tablet (500 mg total) by mouth 2 (two) times daily. 60 tablet 6  . traZODone (DESYREL) 100 MG tablet Take 100 mg by mouth at bedtime.     Marland Kitchen doxycycline (VIBRAMYCIN) 100 MG capsule Take 1 capsule (100 mg total) by mouth 2 (two) times daily. (Patient not taking: Reported on 03/13/2019) 20 capsule 0   Current Facility-Administered Medications  Medication Dose Route Frequency Provider Last Rate Last Admin  . umeclidinium bromide (INCRUSE ELLIPTA) 62.5 MCG/INH 1 puff  1 puff Inhalation Daily Kasa, Kurian, MD         PHYSICAL EXAMINATION: ECOG PERFORMANCE STATUS: 2 - Symptomatic, <50% confined to bed Vitals:   05/21/19 1338  BP: 122/71  Pulse: 78  Resp: 18  Temp: 99.1 F (37.3 C)   There were no vitals filed for this visit. Physical Exam Constitutional:      General: She is not in acute distress.    Appearance: She is obese.     Comments: Sitting in the wheelchair.  HENT:     Head: Normocephalic and atraumatic.  Eyes:     General: No scleral icterus.    Pupils: Pupils are equal, round, and reactive to light.  Cardiovascular:     Rate and Rhythm: Normal rate and regular rhythm.     Heart sounds: Murmur present.  Pulmonary:     Effort: Pulmonary effort is normal. No respiratory distress.     Breath sounds: No wheezing.  Abdominal:     General: Bowel sounds are normal. There is no distension.     Palpations: Abdomen is soft. There is no mass.      Tenderness: There is no abdominal tenderness.  Musculoskeletal:        General: No deformity. Normal range of motion.     Cervical back: Normal range of motion and neck supple.     Comments: Bilateral lower extremity trace edema  Skin:    General: Skin is warm and dry.     Coloration: Skin is not pale.  Neurological:     Mental Status: She is alert and oriented to person, place, and time.     Cranial Nerves: No cranial nerve deficit.     Coordination: Coordination normal.  Psychiatric:        Behavior: Behavior normal.        Thought Content: Thought content normal.       CMP Latest Ref Rng & Units 01/30/2019  Glucose 70 - 99 mg/dL 149(H)  BUN 8 - 23 mg/dL 17  Creatinine 0.44 - 1.00 mg/dL 1.09(H)  Sodium 135 - 145 mmol/L 139  Potassium 3.5 - 5.1 mmol/L 4.6  Chloride 98 - 111 mmol/L 109  CO2 22 - 32 mmol/L 20(L)  Calcium 8.9 - 10.3 mg/dL 8.3(L)  Total Protein 6.5 - 8.1 g/dL 7.0  Total Bilirubin 0.3 - 1.2 mg/dL 0.4  Alkaline Phos 38 - 126 U/L 74  AST 15 - 41 U/L 30  ALT 0 - 44 U/L 18   CBC Latest Ref Rng & Units 05/15/2019  WBC 4.0 - 10.5 K/uL 8.0  Hemoglobin 12.0 - 15.0 g/dL 10.0(L)  Hematocrit 36.0 - 46.0 % 32.8(L)  Platelets 150 - 400 K/uL 169     LABORATORY DATA:  I have reviewed the data as listed Lab Results  Component Value Date   WBC 8.0 05/15/2019   HGB 10.0 (L) 05/15/2019   HCT 32.8 (L) 05/15/2019   MCV 88.9 05/15/2019   PLT 169 05/15/2019   Recent Labs    01/07/19 2306 01/17/19 1436 01/30/19 1021  NA 136 133* 139  K 4.9 4.8 4.6  CL 105 101 109  CO2 22 22 20*  GLUCOSE 133* 283* 149*  BUN 17 14 17   CREATININE 0.98 1.07* 1.09*  CALCIUM 8.3* 8.4* 8.3*  GFRNONAA 57* 51* 50*  GFRAA >60 59* 58*  PROT  --   --  7.0  ALBUMIN  --   --  3.2*  AST  --   --  30  ALT  --   --  18  ALKPHOS  --   --  74  BILITOT  --   --  0.4   Iron/TIBC/Ferritin/ %Sat    Component Value Date/Time   IRON 46 05/15/2019 1253   IRON 41 (L) 03/31/2014 1417   TIBC  344 05/15/2019 1253   TIBC 264 03/31/2014 1417   FERRITIN 107 05/15/2019 1253   FERRITIN 406 (H) 03/31/2014 1417   IRONPCTSAT 13 05/15/2019 1253   IRONPCTSAT 16 03/31/2014 1417     No results found.    ASSESSMENT & PLAN:  1. Iron deficiency anemia due to chronic blood loss   2. Anemia due to stage 3a chronic kidney disease   3. Other fatigue   Labs reviewed and discussed with patient. Hemoglobin has slightly improved to 10. Iron saturation showed ferritin 107, iron saturation 13 Patient has received multiple doses of IV Venofer. In the context of CKD, I would  recommend to proceed with IV Venofer 200 mg x 1 today and repeat another dose in 3 weeks.  Goal of  ferritin above 200. Hemoglobin has reached 10.  No need for erythropoietin replacement therapy.  History of lower GI bleed.  Discussed with patient again and recommend patient to follow-up with gastroenterology.  Patient informs me that she had bad experience with endoscopy and she does not want to have any endoscopy work-up ever.  She understands the risk of missing significant findings, i.e. cancer.  Fatigue, etiology unknown.  Probably due to multiple medical problems. Orders Placed This Encounter  Procedures  . CBC with Differential/Platelet    Standing Status:   Future    Standing Expiration Date:   05/20/2020  . Ferritin    Standing Status:   Future    Standing Expiration Date:   05/20/2020  . Iron and TIBC    Standing Status:   Future    Standing Expiration Date:   05/20/2020    All questions were answered. The patient knows to call the clinic with any problems questions or concerns.  [place]  Return of visit: 3 months   Earlie Server, MD, PhD Hematology Oncology [facility] at [facility] Pager- [phone] 05/21/2019

## 2019-06-11 ENCOUNTER — Other Ambulatory Visit: Payer: Self-pay

## 2019-06-11 ENCOUNTER — Inpatient Hospital Stay: Payer: Medicare HMO | Attending: Oncology

## 2019-06-11 DIAGNOSIS — D631 Anemia in chronic kidney disease: Secondary | ICD-10-CM | POA: Insufficient documentation

## 2019-06-11 DIAGNOSIS — N1831 Chronic kidney disease, stage 3a: Secondary | ICD-10-CM | POA: Insufficient documentation

## 2019-06-11 DIAGNOSIS — D500 Iron deficiency anemia secondary to blood loss (chronic): Secondary | ICD-10-CM

## 2019-06-11 MED ORDER — IRON SUCROSE 20 MG/ML IV SOLN
200.0000 mg | Freq: Once | INTRAVENOUS | Status: AC
  Administered 2019-06-11: 14:00:00 200 mg via INTRAVENOUS
  Filled 2019-06-11: qty 10

## 2019-06-11 MED ORDER — SODIUM CHLORIDE 0.9 % IV SOLN
INTRAVENOUS | Status: DC
  Filled 2019-06-11: qty 250

## 2019-08-12 ENCOUNTER — Inpatient Hospital Stay: Payer: Medicare HMO | Attending: Oncology

## 2019-08-12 DIAGNOSIS — D500 Iron deficiency anemia secondary to blood loss (chronic): Secondary | ICD-10-CM

## 2019-08-12 DIAGNOSIS — D631 Anemia in chronic kidney disease: Secondary | ICD-10-CM | POA: Diagnosis present

## 2019-08-12 DIAGNOSIS — N1831 Chronic kidney disease, stage 3a: Secondary | ICD-10-CM | POA: Diagnosis not present

## 2019-08-12 LAB — CBC WITH DIFFERENTIAL/PLATELET
Abs Immature Granulocytes: 0.07 10*3/uL (ref 0.00–0.07)
Basophils Absolute: 0.1 10*3/uL (ref 0.0–0.1)
Basophils Relative: 1 %
Eosinophils Absolute: 0.3 10*3/uL (ref 0.0–0.5)
Eosinophils Relative: 3 %
HCT: 36 % (ref 36.0–46.0)
Hemoglobin: 11.2 g/dL — ABNORMAL LOW (ref 12.0–15.0)
Immature Granulocytes: 1 %
Lymphocytes Relative: 21 %
Lymphs Abs: 1.7 10*3/uL (ref 0.7–4.0)
MCH: 27.5 pg (ref 26.0–34.0)
MCHC: 31.1 g/dL (ref 30.0–36.0)
MCV: 88.2 fL (ref 80.0–100.0)
Monocytes Absolute: 0.6 10*3/uL (ref 0.1–1.0)
Monocytes Relative: 7 %
Neutro Abs: 5.6 10*3/uL (ref 1.7–7.7)
Neutrophils Relative %: 67 %
Platelets: 154 10*3/uL (ref 150–400)
RBC: 4.08 MIL/uL (ref 3.87–5.11)
RDW: 13.3 % (ref 11.5–15.5)
WBC: 8.2 10*3/uL (ref 4.0–10.5)
nRBC: 0 % (ref 0.0–0.2)

## 2019-08-12 LAB — IRON AND TIBC
Iron: 52 ug/dL (ref 28–170)
Saturation Ratios: 17 % (ref 10.4–31.8)
TIBC: 300 ug/dL (ref 250–450)
UIBC: 248 ug/dL

## 2019-08-12 LAB — FERRITIN: Ferritin: 178 ng/mL (ref 11–307)

## 2019-08-14 ENCOUNTER — Ambulatory Visit: Payer: Medicare HMO

## 2019-08-14 ENCOUNTER — Ambulatory Visit: Payer: Medicare HMO | Admitting: Oncology

## 2019-08-18 NOTE — Progress Notes (Signed)
Called patient no answer left message  

## 2019-08-19 ENCOUNTER — Encounter: Payer: Self-pay | Admitting: Oncology

## 2019-08-19 ENCOUNTER — Other Ambulatory Visit: Payer: Self-pay

## 2019-08-19 ENCOUNTER — Inpatient Hospital Stay: Payer: Medicare HMO

## 2019-08-19 ENCOUNTER — Inpatient Hospital Stay (HOSPITAL_BASED_OUTPATIENT_CLINIC_OR_DEPARTMENT_OTHER): Payer: Medicare HMO | Admitting: Oncology

## 2019-08-19 VITALS — BP 131/75 | HR 74 | Temp 97.3°F | Wt 205.4 lb

## 2019-08-19 DIAGNOSIS — D500 Iron deficiency anemia secondary to blood loss (chronic): Secondary | ICD-10-CM

## 2019-08-19 DIAGNOSIS — D631 Anemia in chronic kidney disease: Secondary | ICD-10-CM | POA: Diagnosis not present

## 2019-08-19 DIAGNOSIS — N1831 Chronic kidney disease, stage 3a: Secondary | ICD-10-CM | POA: Diagnosis not present

## 2019-08-19 NOTE — Progress Notes (Signed)
Patient here for follow up. No concerns voiced.  °

## 2019-08-21 NOTE — Progress Notes (Signed)
Hematology/Oncology follow up note [facility] [phone] [phone]   Patient Care Team: [place] as PCP - General Angelena Form, Annita Brod, MD as PCP - Cardiology (Cardiology) Elsie Stain, MD as Attending Physician (Pulmonary Disease) [name], MD as Consulting Physician (Cardiology) Gabriel Carina Betsey Holiday, MD as Physician Assistant (Internal Medicine) [name], MD as Consulting Physician (Cardiothoracic Surgery) Earlie Server, MD as Consulting Physician (Hematology and Oncology)  REFERRING PROVIDER: [place] CHIEF COMPLAINTS/REASON FOR VISIT:  Evaluation of iron deficiency anemia  HISTORY OF PRESENTING ILLNESS:  [name] is a  74 y.o.  female with PMH listed below was seen in consultation at the request of [place]   for evaluation of iron deficiency anemia.   Reviewed patient's recent labs  01/17/2019 labs revealed anemia with hemoglobin of 8.5, MCV 75.3. Reviewed patient's previous labs ordered by primary care physician's office, anemia is chronic onset , duration is since 2009.  Patient presented to emergency room on 01/17/2019 for evaluation of fatigue, patient is currently on a 10-day course of doxycycline prescribed by emergency room. She was also recently admitted between 01/07/2019 - 01/08/2019 due to parotitis submandibular gland inflammation.  Received a Decadron and Unasyn.  During that admission, patient received IV Venofer treatments.   Associated signs and symptoms: Patient reports fatigue.  Associated with SOB with exertion.  Denies weight loss, easy bruising, hematochezia, hemoptysis, hematuria. Context:  History of iron deficiency: History of iron deficiency. Rectal bleeding: Denies any blood in the stool. Patient had a history of GI bleeding. Last endoscopy: 11/08/2012 colonoscopy a single solitary 12 mm ulcer was found in the descending colon.  A lot of blood suctioned  from colon.  Some bleeding was present.  Stigmata of recent bleeding was present.  Area was successfully injected with 6 mL of a 1-10,000 solution of epinephrine for drug delivery.  1 large hemostatic clip was successfully placed.  Single large mouth diverticulum was found in the sigmoid colon.  Area was successfully injected with 3 mils of 1-10,000 solution of epinephrine.  Benign polyps were removed. 01/28/2013 EGD by Dr. Jamal Collin showed erythematous mucosa in the antrum.  Esophageal.  Mucosal changes suspicious for Barrett esophagus.  Biopsy showed antral mucosa with moderate reactive gastropathy.  No intestinal metaplasia, dysplasia or malignancy.  No H. pylori.  GE junction biopsy showed stratified squamous epithelium without inflammation or reactive changes.  And a single tiny focus of columnar epithelium most compatible with an esophageal clot.  No interstitial metaplasia or dysplasia or malignancy.    INTERVAL HISTORY [name] is a 74 y.o. female who has above history reviewed by me today presents for follow up visit for management of iron deficiency anemia.  Problems and complaints are listed below: Patient reports that fatigue has improved. No new complaints.      Review of Systems  Constitutional: Negative for appetite change, chills, fatigue and fever.  HENT:   Negative for hearing loss and voice change.   Eyes: Negative for eye problems.  Respiratory: Negative for chest tightness, cough and shortness of breath.   Cardiovascular: Negative for chest pain.  Gastrointestinal: Negative for abdominal distention, abdominal pain and blood in stool.  Endocrine: Negative for hot flashes.  Genitourinary: Negative for difficulty urinating and frequency.   Musculoskeletal: Negative for arthralgias.  Skin: Negative for itching and rash.  Neurological: Negative for extremity weakness.  Hematological: Negative for adenopathy.  Psychiatric/Behavioral: Negative for confusion.    MEDICAL  HISTORY:  Past Medical History:  Diagnosis Date  . 1st degree AV block   . ACE-inhibitor cough   . Allergic rhinitis   . Anemia    iron deficiency anemia  . Anxiety   . Aortic ectasia (HCC)    a. CT abd in 12/2016 incidentally noted aortic atherosclerosis and infrarenal abdominal aortic ectasia measuring as large as 2.7 cm with recommendation to repeat US in 2023.  Marland Kitchen Arthritis   . Asthma   . Cataract   . Chronic depression   . Chronic diastolic CHF (congestive heart failure) ([place])   . Chronic headache   . COPD (chronic obstructive pulmonary disease) ([place])   . Coronary artery disease    a. DES to RCA and mid Cx 2009. b. CABG and bioprosthetic AVR May 2015. c. cutting balloon to prox Cx in 05/2016  . Diabetes mellitus    type 2  . Diverticulitis of colon   . Essential hypertension   . GERD (gastroesophageal reflux disease)   . Hearing loss   . History of blood transfusion 2013  . History of prosthetic aortic valve replacement   . HOH (hard of hearing)   . Hypercholesterolemia    intolerance of statins and niaspan  . IDA (iron deficiency anemia) 02/03/2019  . Mobitz type 1 second degree AV block   . OSA (obstructive sleep apnea)    mild, intolerant of cpap  . PAD (peripheral artery disease) ([place])    a. atherosclerosis by CT abd 12/2016 in [place].  Marland Kitchen PONV (postoperative nausea and vomiting)   . Statin intolerance   . Thyroid disease     SURGICAL HISTORY: Past Surgical History:  Procedure Laterality Date  . ABDOMINAL HYSTERECTOMY    . ABDOMINAL HYSTERECTOMY W/ PARTIAL VAGINACTOMY    . AORTIC VALVE REPLACEMENT N/A 10/12/2013   Procedure: AORTIC VALVE REPLACEMENT (AVR);  Surgeon: [name], MD;  Location: [place];  Service: Open Heart Surgery;  Laterality: N/A;  . [place]  . BARTHOLIN GLAND CYST EXCISION    . BLADDER SUSPENSION    . BREAST BIOPSY Bilateral 09/11/2000   neg  . BREAST BIOPSY Left 07/24/2010   neg  . BREAST CYST EXCISION  1988   bilateral  nonmalignant tumors, x3  . CARDIAC CATHETERIZATION    . CARDIAC CATHETERIZATION N/A 05/25/2016   Procedure: Coronary Balloon Angioplasty;  Surgeon: Leonie Man, MD;  Location: [place] [facility];  Service: Cardiovascular;  Laterality: N/A;  . CARDIAC CATHETERIZATION N/A 05/25/2016   Procedure: Coronary/Graft Angiography;  Surgeon: Leonie Man, MD;  Location: [place] [facility];  Service: Cardiovascular;  Laterality: N/A;  . CATARACT EXTRACTION W/ INTRAOCULAR LENS  IMPLANT, BILATERAL    . CHOLECYSTECTOMY  2001  . COLECTOMY     lap sigmoid  . COLONOSCOPY  2014   polyps found, 2 clamped off.  . CORONARY ANGIOPLASTY  10/29/2007   Prox RCA & Mid Cx.  . CORONARY ARTERY BYPASS GRAFT N/A 10/12/2013   Procedure: CORONARY ARTERY BYPASS GRAFT TIMES TWO;  Surgeon: [name], MD;  Location: [place];  Service: Open Heart Surgery;  Laterality: N/A;  . CORONARY/GRAFT ANGIOGRAPHY N/A 09/20/2017   Procedure: CORONARY/GRAFT ANGIOGRAPHY;  Surgeon: Sherren Mocha, MD;  Location: [place] [facility];  Service: Cardiovascular;  Laterality: N/A;  . LEFT HEART CATHETERIZATION WITH CORONARY ANGIOGRAM N/A 10/09/2013   Procedure: LEFT HEART CATHETERIZATION WITH CORONARY ANGIOGRAM;  Surgeon: [name], MD;  Location: [facility];  Service: Cardiovascular;  Laterality: N/A;  . STERNAL WIRES REMOVAL N/A 04/13/2014   Procedure: STERNAL WIRES REMOVAL;  Surgeon: [name], MD;  Location: [facility];  Service: Thoracic;  Laterality: N/A;  . THYROIDECTOMY    . TONSILLECTOMY    . TUBAL LIGATION    . VAGINAL DELIVERY     3  . VISCERAL ARTERY INTERVENTION N/A 08/16/2016   Procedure: Visceral Artery Intervention;  Surgeon: Algernon Huxley, MD;  Location: [place] [facility];  Service: Cardiovascular;  Laterality: N/A;    SOCIAL HISTORY: Social History   Socioeconomic History  . Marital status: Married    Spouse name: Not on file  . Number of children: 3  . Years of education: Not on file  . Highest  education level: Not on file  Occupational History  . Occupation: Retired    Fish farm manager: UNEMPLOYED    Comment: CNA  Tobacco Use  . Smoking status: Former Smoker    Packs/day: 0.50    Years: 30.00    Pack years: 15.00    Types: Cigarettes    Quit date: 10/02/2013    Years since quitting: 5.8  . Smokeless tobacco: Never Used  Substance and Sexual Activity  . Alcohol use: No  . Drug use: No  . Sexual activity: Not Currently  Other Topics Concern  . Not on file  Social History Narrative   Does not have Living Will   Desires CPR, would not want prolonged life support if futile.   Social Determinants of Health   Financial Resource Strain:   . Difficulty of Paying Living Expenses:   Food Insecurity:   . Worried About Charity fundraiser in the Last Year:   . Arboriculturist in the Last Year:   Transportation Needs:   . Film/video editor (Medical):   Marland Kitchen Lack of Transportation (Non-Medical):   Physical Activity:   . Days of Exercise per Week:   . Minutes of Exercise per Session:   Stress:   . Feeling of Stress :   Social Connections:   . Frequency of Communication with Friends and Family:   . Frequency of Social Gatherings with Friends and Family:   . Attends Religious Services:   . Active Member of Clubs or Organizations:   . Attends Archivist Meetings:   Marland Kitchen Marital Status:   Intimate Partner Violence:   . Fear of Current or Ex-Partner:   . Emotionally Abused:   Marland Kitchen Physically Abused:   . Sexually Abused:     FAMILY HISTORY: Family History  Problem Relation Age of Onset  . Breast cancer Mother 46  . Hypertension Father   . Mesothelioma Father   . Asthma Father   . Stroke Paternal Grandfather   . Heart disease Other   . Breast cancer Maternal Aunt   . Breast cancer Paternal Aunt     ALLERGIES:  is allergic to amitriptyline; benadryl [diphenhydramine]; demerol [meperidine]; gabapentin; loratadine; meperidine hcl; mirtazapine; olanzapine; voltaren  [diclofenac sodium]; zetia [ezetimibe]; ativan [lorazepam]; atorvastatin; budesonide-formoterol fumarate; bupropion hcl; caffeine; codeine sulfate; lisinopril; metformin; mometasone furoate; morphine sulfate; other; oxycodone-acetaminophen; pioglitazone; propoxyphene n-acetaminophen; rosuvastatin; shellfish allergy; suvorexant; ticagrelor; tramadol; trazodone and nefazodone; venlafaxine; zolpidem tartrate; and latex.  MEDICATIONS:  Current Outpatient Medications  Medication Sig Dispense Refill  . ARIPiprazole (ABILIFY) 2 MG tablet Take 2 mg by mouth daily.    . budesonide (PULMICORT) 0.5 MG/2ML nebulizer solution Take 0.5 mg by nebulization daily.     . Calcium-Vitamin D (CALTRATE 600 PLUS-VIT  D PO) Take 1 tablet by mouth 2 (two) times daily.     . citalopram (CELEXA) 20 MG tablet Take 20 mg by mouth daily.    . DULoxetine (CYMBALTA) 20 MG capsule TAKE 1 CAPSULE BY MOUTH ONCE DAILY FOR 7 DAYS AND THEN INCREASE TO 2 CAPSULES DAILY WITH BREAKFAST    . FLUoxetine (PROZAC) 40 MG capsule Take by mouth.    . furosemide (LASIX) 20 MG tablet Take 20 mg by mouth daily as needed for fluid or edema.     . insulin NPH-regular Human (NOVOLIN 70/30) (70-30) 100 UNIT/ML injection Inject 70 Units into the skin 2 (two) times daily with a meal. Inject 70u under the skin every morning at breakfast and inject 70u under the skin every evening with supper    . levothyroxine (SYNTHROID, LEVOTHROID) 125 MCG tablet Take 125 mcg by mouth daily.     . metFORMIN (GLUCOPHAGE) 1000 MG tablet Take 1,000 mg by mouth 2 (two) times daily with a meal.    . nitroGLYCERIN (NITROSTAT) 0.4 MG SL tablet Place 1 tablet (0.4 mg total) under the tongue every 5 (five) minutes as needed for chest pain. 25 tablet 1  . olmesartan (BENICAR) 20 MG tablet Take 20 mg by mouth at bedtime.    . ondansetron (ZOFRAN ODT) 4 MG disintegrating tablet Take 1 tablet (4 mg total) by mouth every 6 (six) hours as needed for nausea or vomiting. 20 tablet 0  .  pantoprazole (PROTONIX) 20 MG tablet Take 20 mg by mouth daily.    . promethazine (PHENERGAN) 25 MG tablet Take 25 mg by mouth every 6 (six) hours as needed for nausea or vomiting.    . traZODone (DESYREL) 100 MG tablet Take 100 mg by mouth at bedtime.     Marland Kitchen albuterol (PROVENTIL) (2.5 MG/3ML) 0.083% nebulizer solution Inhale into the lungs.    . citalopram (CELEXA) 40 MG tablet Take 40 mg by mouth every morning.    Marland Kitchen doxycycline (VIBRAMYCIN) 100 MG capsule Take 1 capsule (100 mg total) by mouth 2 (two) times daily. (Patient not taking: Reported on 03/13/2019) 20 capsule 0  . ranolazine (RANEXA) 500 MG 12 hr tablet Take 1 tablet (500 mg total) by mouth 2 (two) times daily. 60 tablet 6   Current Facility-Administered Medications  Medication Dose Route Frequency Provider Last Rate Last Admin  . umeclidinium bromide (INCRUSE ELLIPTA) 62.5 MCG/INH 1 puff  1 puff Inhalation Daily Kasa, Kurian, MD         PHYSICAL EXAMINATION: ECOG PERFORMANCE STATUS: 1 - Symptomatic but completely ambulatory Vitals:   08/19/19 1307  BP: 131/75  Pulse: 74  Temp: (!) 97.3 F (36.3 C)   Filed Weights   08/19/19 1307  Weight: 205 lb 6.4 oz (93.2 kg)   Physical Exam Constitutional:      General: She is not in acute distress.    Appearance: She is obese.  HENT:     Head: Normocephalic and atraumatic.  Eyes:     General: No scleral icterus.    Pupils: Pupils are equal, round, and reactive to light.  Cardiovascular:     Rate and Rhythm: Normal rate and regular rhythm.     Heart sounds: Murmur present.  Pulmonary:     Effort: Pulmonary effort is normal. No respiratory distress.     Breath sounds: No wheezing.  Abdominal:     General: Bowel sounds are normal. There is no distension.     Palpations: Abdomen is soft. There is  no mass.     Tenderness: There is no abdominal tenderness.  Musculoskeletal:        General: No deformity. Normal range of motion.     Cervical back: Normal range of motion and neck  supple.     Comments: Bilateral lower extremity trace edema  Skin:    General: Skin is warm and dry.     Coloration: Skin is not pale.     Findings: No erythema or rash.  Neurological:     Mental Status: She is alert and oriented to person, place, and time. Mental status is at baseline.     Cranial Nerves: No cranial nerve deficit.     Coordination: Coordination normal.  Psychiatric:        Mood and Affect: Mood normal.       CMP Latest Ref Rng & Units 01/30/2019  Glucose 70 - 99 mg/dL 149(H)  BUN 8 - 23 mg/dL 17  Creatinine 0.44 - 1.00 mg/dL 1.09(H)  Sodium 135 - 145 mmol/L 139  Potassium 3.5 - 5.1 mmol/L 4.6  Chloride 98 - 111 mmol/L 109  CO2 22 - 32 mmol/L 20(L)  Calcium 8.9 - 10.3 mg/dL 8.3(L)  Total Protein 6.5 - 8.1 g/dL 7.0  Total Bilirubin 0.3 - 1.2 mg/dL 0.4  Alkaline Phos 38 - 126 U/L 74  AST 15 - 41 U/L 30  ALT 0 - 44 U/L 18   CBC Latest Ref Rng & Units 08/12/2019  WBC 4.0 - 10.5 K/uL 8.2  Hemoglobin 12.0 - 15.0 g/dL 11.2(L)  Hematocrit 36.0 - 46.0 % 36.0  Platelets 150 - 400 K/uL 154     LABORATORY DATA:  I have reviewed the data as listed Lab Results  Component Value Date   WBC 8.2 08/12/2019   HGB 11.2 (L) 08/12/2019   HCT 36.0 08/12/2019   MCV 88.2 08/12/2019   PLT 154 08/12/2019   Recent Labs    01/07/19 2306 01/17/19 1436 01/30/19 1021  NA 136 133* 139  K 4.9 4.8 4.6  CL 105 101 109  CO2 22 22 20*  GLUCOSE 133* 283* 149*  BUN 17 14 17   CREATININE 0.98 1.07* 1.09*  CALCIUM 8.3* 8.4* 8.3*  GFRNONAA 57* 51* 50*  GFRAA >60 59* 58*  PROT  --   --  7.0  ALBUMIN  --   --  3.2*  AST  --   --  30  ALT  --   --  18  ALKPHOS  --   --  74  BILITOT  --   --  0.4   Iron/TIBC/Ferritin/ %Sat    Component Value Date/Time   IRON 52 08/12/2019 1050   IRON 41 (L) 03/31/2014 1417   TIBC 300 08/12/2019 1050   TIBC 264 03/31/2014 1417   FERRITIN 178 08/12/2019 1050   FERRITIN 406 (H) 03/31/2014 1417   IRONPCTSAT 17 08/12/2019 1050   IRONPCTSAT  16 03/31/2014 1417     No results found.    ASSESSMENT & PLAN:  1. Iron deficiency anemia due to chronic blood loss   2. Anemia due to stage 3a chronic kidney disease   Labs reviewed and discussed with patient. Hemoglobin has continued to improve to 11.2. Hold additional IV iron at this point. Anemia of CKD, hemoglobin is above 10.  No intervention at this point. History of lower GI bleed.  Declined work-up.   Orders Placed This Encounter  Procedures  . CBC with Differential/Platelet    Standing Status:  Future    Standing Expiration Date:   08/18/2020  . Ferritin    Standing Status:   Future    Standing Expiration Date:   08/18/2020  . Iron and TIBC    Standing Status:   Future    Standing Expiration Date:   08/18/2020    All questions were answered. The patient knows to call the clinic with any problems questions or concerns.  [place]  Return of visit: 6 months   Earlie Server, MD, PhD Hematology Oncology [facility] Pager- [phone] 08/21/2019

## 2019-09-28 ENCOUNTER — Other Ambulatory Visit: Payer: Self-pay | Admitting: Cardiology

## 2019-09-30 ENCOUNTER — Other Ambulatory Visit: Payer: Self-pay

## 2019-09-30 MED ORDER — NITROGLYCERIN 0.4 MG SL SUBL: 0.4000 mg | SUBLINGUAL_TABLET | SUBLINGUAL | 0 refills | Status: DC | PRN

## 2019-09-30 NOTE — Telephone Encounter (Signed)
Pt's medication was sent to pt's pharmacy as requested. Confirmation received.  °

## 2019-10-08 ENCOUNTER — Ambulatory Visit: Payer: Medicare HMO | Admitting: Cardiovascular Disease

## 2019-12-14 ENCOUNTER — Ambulatory Visit: Payer: Medicare HMO | Admitting: Family Medicine

## 2020-01-22 ENCOUNTER — Other Ambulatory Visit
Admission: RE | Admit: 2020-01-22 | Discharge: 2020-01-22 | Disposition: A | Discharge status: Home / Self Care | Payer: Medicare HMO | Source: Ambulatory Visit | Attending: Cardiovascular Disease | Admitting: Cardiovascular Disease

## 2020-01-22 DIAGNOSIS — I2721 Secondary pulmonary arterial hypertension: Secondary | ICD-10-CM | POA: Insufficient documentation

## 2020-01-22 DIAGNOSIS — E039 Hypothyroidism, unspecified: Secondary | ICD-10-CM | POA: Diagnosis not present

## 2020-01-22 DIAGNOSIS — R252 Cramp and spasm: Secondary | ICD-10-CM | POA: Diagnosis not present

## 2020-01-22 DIAGNOSIS — R600 Localized edema: Secondary | ICD-10-CM | POA: Insufficient documentation

## 2020-01-22 LAB — BRAIN NATRIURETIC PEPTIDE: B Natriuretic Peptide: 102.2 pg/mL — ABNORMAL HIGH (ref 0.0–100.0)

## 2020-02-15 ENCOUNTER — Other Ambulatory Visit: Payer: Self-pay

## 2020-02-15 ENCOUNTER — Inpatient Hospital Stay: Payer: Medicare HMO | Attending: Oncology

## 2020-02-15 DIAGNOSIS — Z803 Family history of malignant neoplasm of breast: Secondary | ICD-10-CM | POA: Diagnosis not present

## 2020-02-15 DIAGNOSIS — Z87891 Personal history of nicotine dependence: Secondary | ICD-10-CM | POA: Insufficient documentation

## 2020-02-15 DIAGNOSIS — Z823 Family history of stroke: Secondary | ICD-10-CM | POA: Diagnosis not present

## 2020-02-15 DIAGNOSIS — N1831 Chronic kidney disease, stage 3a: Secondary | ICD-10-CM | POA: Diagnosis not present

## 2020-02-15 DIAGNOSIS — R5383 Other fatigue: Secondary | ICD-10-CM | POA: Diagnosis not present

## 2020-02-15 DIAGNOSIS — D500 Iron deficiency anemia secondary to blood loss (chronic): Secondary | ICD-10-CM | POA: Diagnosis present

## 2020-02-15 DIAGNOSIS — Z8249 Family history of ischemic heart disease and other diseases of the circulatory system: Secondary | ICD-10-CM | POA: Diagnosis not present

## 2020-02-15 DIAGNOSIS — Z79899 Other long term (current) drug therapy: Secondary | ICD-10-CM | POA: Insufficient documentation

## 2020-02-15 DIAGNOSIS — K922 Gastrointestinal hemorrhage, unspecified: Secondary | ICD-10-CM | POA: Insufficient documentation

## 2020-02-15 DIAGNOSIS — I5032 Chronic diastolic (congestive) heart failure: Secondary | ICD-10-CM | POA: Insufficient documentation

## 2020-02-15 DIAGNOSIS — Z836 Family history of other diseases of the respiratory system: Secondary | ICD-10-CM | POA: Insufficient documentation

## 2020-02-15 DIAGNOSIS — R0602 Shortness of breath: Secondary | ICD-10-CM | POA: Insufficient documentation

## 2020-02-15 DIAGNOSIS — H9190 Unspecified hearing loss, unspecified ear: Secondary | ICD-10-CM | POA: Diagnosis not present

## 2020-02-15 LAB — CBC WITH DIFFERENTIAL/PLATELET
Abs Immature Granulocytes: 0.04 10*3/uL (ref 0.00–0.07)
Basophils Absolute: 0.1 10*3/uL (ref 0.0–0.1)
Basophils Relative: 1 %
Eosinophils Absolute: 0.2 10*3/uL (ref 0.0–0.5)
Eosinophils Relative: 3 %
HCT: 30.7 % — ABNORMAL LOW (ref 36.0–46.0)
Hemoglobin: 9.9 g/dL — ABNORMAL LOW (ref 12.0–15.0)
Immature Granulocytes: 1 %
Lymphocytes Relative: 20 %
Lymphs Abs: 1.5 10*3/uL (ref 0.7–4.0)
MCH: 28.4 pg (ref 26.0–34.0)
MCHC: 32.2 g/dL (ref 30.0–36.0)
MCV: 88 fL (ref 80.0–100.0)
Monocytes Absolute: 0.5 10*3/uL (ref 0.1–1.0)
Monocytes Relative: 7 %
Neutro Abs: 5 10*3/uL (ref 1.7–7.7)
Neutrophils Relative %: 68 %
Platelets: 141 10*3/uL — ABNORMAL LOW (ref 150–400)
RBC: 3.49 MIL/uL — ABNORMAL LOW (ref 3.87–5.11)
RDW: 13.9 % (ref 11.5–15.5)
WBC: 7.3 10*3/uL (ref 4.0–10.5)
nRBC: 0 % (ref 0.0–0.2)

## 2020-02-15 LAB — IRON AND TIBC
Iron: 37 ug/dL (ref 28–170)
Saturation Ratios: 13 % (ref 10.4–31.8)
TIBC: 276 ug/dL (ref 250–450)
UIBC: 239 ug/dL

## 2020-02-15 LAB — FERRITIN: Ferritin: 135 ng/mL (ref 11–307)

## 2020-02-17 ENCOUNTER — Inpatient Hospital Stay: Payer: Medicare HMO

## 2020-02-17 ENCOUNTER — Encounter: Payer: Self-pay | Admitting: Oncology

## 2020-02-17 ENCOUNTER — Other Ambulatory Visit: Payer: Self-pay

## 2020-02-17 ENCOUNTER — Inpatient Hospital Stay (HOSPITAL_BASED_OUTPATIENT_CLINIC_OR_DEPARTMENT_OTHER): Payer: Medicare HMO | Admitting: Oncology

## 2020-02-17 VITALS — BP 135/76 | HR 75 | Temp 99.1°F | Resp 20

## 2020-02-17 VITALS — BP 113/69 | HR 75

## 2020-02-17 DIAGNOSIS — D631 Anemia in chronic kidney disease: Secondary | ICD-10-CM | POA: Diagnosis not present

## 2020-02-17 DIAGNOSIS — D500 Iron deficiency anemia secondary to blood loss (chronic): Secondary | ICD-10-CM

## 2020-02-17 DIAGNOSIS — N1831 Chronic kidney disease, stage 3a: Secondary | ICD-10-CM | POA: Diagnosis not present

## 2020-02-17 MED ORDER — IRON SUCROSE 20 MG/ML IV SOLN
200.0000 mg | Freq: Once | INTRAVENOUS | Status: AC
  Administered 2020-02-17: 200 mg via INTRAVENOUS
  Filled 2020-02-17: qty 10

## 2020-02-17 MED ORDER — SODIUM CHLORIDE 0.9 % IV SOLN
Freq: Once | INTRAVENOUS | Status: AC
  Filled 2020-02-17: qty 250

## 2020-02-17 NOTE — Progress Notes (Signed)
Patient reports that her energy level has decreased.

## 2020-02-17 NOTE — Progress Notes (Signed)
Hematology/Oncology follow up note [facility] [phone] Fax:([phone]) [phone]   Patient Care Team: [place] as PCP - General Angelena Form, Annita Brod, MD as PCP - Cardiology (Cardiology) Elsie Stain, MD as Attending Physician (Pulmonary Disease) [name], MD as Consulting Physician (Cardiology) Gabriel Carina Betsey Holiday, MD as Physician Assistant (Internal Medicine) [name], MD as Consulting Physician (Cardiothoracic Surgery) Earlie Server, MD as Consulting Physician (Hematology and Oncology)  REFERRING PROVIDER: [place] CHIEF COMPLAINTS/REASON FOR VISIT:  Follow up  of iron deficiency anemia  HISTORY OF PRESENTING ILLNESS:  [name] is a  74 y.o.  female with PMH listed below was seen in consultation at the request of [place]   for evaluation of iron deficiency anemia.   Reviewed patient's recent labs  01/17/2019 labs revealed anemia with hemoglobin of 8.5, MCV 75.3. Reviewed patient's previous labs ordered by primary care physician's office, anemia is chronic onset , duration is since 2009.  Patient presented to emergency room on 01/17/2019 for evaluation of fatigue, patient is currently on a 10-day course of doxycycline prescribed by emergency room. She was also recently admitted between 01/07/2019 - 01/08/2019 due to parotitis submandibular gland inflammation.  Received a Decadron and Unasyn.  During that admission, patient received IV Venofer treatments.   Associated signs and symptoms: Patient reports fatigue.  Associated with SOB with exertion.  Denies weight loss, easy bruising, hematochezia, hemoptysis, hematuria. Context:  History of iron deficiency: History of iron deficiency. Rectal bleeding: Denies any blood in the stool. Patient had a history of GI bleeding. Last endoscopy: 11/08/2012 colonoscopy a single solitary 12 mm ulcer was found in the descending colon.  A lot of blood suctioned  from colon.  Some bleeding was present.  Stigmata of recent bleeding was present.  Area was successfully injected with 6 mL of a 1-10,000 solution of epinephrine for drug delivery.  1 large hemostatic clip was successfully placed.  Single large mouth diverticulum was found in the sigmoid colon.  Area was successfully injected with 3 mils of 1-10,000 solution of epinephrine.  Benign polyps were removed. 01/28/2013 EGD by Dr. Jamal Collin showed erythematous mucosa in the antrum.  Esophageal.  Mucosal changes suspicious for Barrett esophagus.  Biopsy showed antral mucosa with moderate reactive gastropathy.  No intestinal metaplasia, dysplasia or malignancy.  No H. pylori.  GE junction biopsy showed stratified squamous epithelium without inflammation or reactive changes.  And a single tiny focus of columnar epithelium most compatible with an esophageal clot.  No interstitial metaplasia or dysplasia or malignancy.    INTERVAL HISTORY [name] is a 74 y.o. female who has above history reviewed by me today presents for follow up visit for management of iron deficiency anemia.  Problems and complaints are listed below: Patient reports feeling tired.  She has hearing impairment.  Accompanied by a caregiver. Denies any black or bloody bowel movement.      Review of Systems  Constitutional: Positive for fatigue. Negative for appetite change, chills and fever.  HENT:   Positive for hearing loss. Negative for voice change.   Eyes: Negative for eye problems.  Respiratory: Negative for chest tightness, cough and shortness of breath.   Cardiovascular: Negative for chest pain.  Gastrointestinal: Negative for abdominal distention, abdominal pain and blood in stool.  Endocrine: Negative for hot flashes.  Genitourinary: Negative for difficulty urinating and frequency.   Musculoskeletal: Negative for arthralgias.  Skin: Negative for itching and rash.  Neurological: Negative  for extremity weakness.  Hematological:  Negative for adenopathy.  Psychiatric/Behavioral: Negative for confusion.    MEDICAL HISTORY:  Past Medical History:  Diagnosis Date   1st degree AV block    ACE-inhibitor cough    Allergic rhinitis    Anemia    iron deficiency anemia   Anxiety    Aortic ectasia (HCC)    a. CT abd in 12/2016 incidentally noted aortic atherosclerosis and infrarenal abdominal aortic ectasia measuring as large as 2.7 cm with recommendation to repeat US in 2023.   Arthritis    Asthma    Cataract    Chronic depression    Chronic diastolic CHF (congestive heart failure) (HCC)    Chronic headache    COPD (chronic obstructive pulmonary disease) (HCC)    Coronary artery disease    a. DES to RCA and mid Cx 2009. b. CABG and bioprosthetic AVR May 2015. c. cutting balloon to prox Cx in 05/2016   Diabetes mellitus    type 2   Diverticulitis of colon    Essential hypertension    GERD (gastroesophageal reflux disease)    Hearing loss    History of blood transfusion 2013   History of prosthetic aortic valve replacement    HOH (hard of hearing)    Hypercholesterolemia    intolerance of statins and niaspan   IDA (iron deficiency anemia) 02/03/2019   Mobitz type 1 second degree AV block    OSA (obstructive sleep apnea)    mild, intolerant of cpap   PAD (peripheral artery disease) ([place])    a. atherosclerosis by CT abd 12/2016 in [place].   PONV (postoperative nausea and vomiting)    Statin intolerance    Thyroid disease     SURGICAL HISTORY: Past Surgical History:  Procedure Laterality Date   ABDOMINAL HYSTERECTOMY     ABDOMINAL HYSTERECTOMY W/ PARTIAL VAGINACTOMY     AORTIC VALVE REPLACEMENT N/A 10/12/2013   Procedure: AORTIC VALVE REPLACEMENT (AVR);  Surgeon: [name], MD;  Location: [place];  Service: Open Heart Surgery;  Laterality: N/A;   APPENDECTOMY  1964   BARTHOLIN GLAND CYST EXCISION     BLADDER SUSPENSION     BREAST BIOPSY Bilateral 09/11/2000   neg    BREAST BIOPSY Left 07/24/2010   neg   BREAST CYST EXCISION  1988   bilateral nonmalignant tumors, x3   CARDIAC CATHETERIZATION     CARDIAC CATHETERIZATION N/A 05/25/2016   Procedure: Coronary Balloon Angioplasty;  Surgeon: Leonie Man, MD;  Location: [place] [facility];  Service: Cardiovascular;  Laterality: N/A;   CARDIAC CATHETERIZATION N/A 05/25/2016   Procedure: Coronary/Graft Angiography;  Surgeon: Leonie Man, MD;  Location: [place] [facility];  Service: Cardiovascular;  Laterality: N/A;   CATARACT EXTRACTION W/ INTRAOCULAR LENS  IMPLANT, BILATERAL     CHOLECYSTECTOMY  2001   COLECTOMY     lap sigmoid   COLONOSCOPY  2014   polyps found, 2 clamped off.   CORONARY ANGIOPLASTY  10/29/2007   Prox RCA & Mid Cx.   CORONARY ARTERY BYPASS GRAFT N/A 10/12/2013   Procedure: CORONARY ARTERY BYPASS GRAFT TIMES TWO;  Surgeon: [name], MD;  Location: [place] [facility];  Service: Open Heart Surgery;  Laterality: N/A;   CORONARY/GRAFT ANGIOGRAPHY N/A 09/20/2017   Procedure: CORONARY/GRAFT ANGIOGRAPHY;  Surgeon: Sherren Mocha, MD;  Location: [place] [facility];  Service: Cardiovascular;  Laterality: N/A;   LEFT HEART CATHETERIZATION WITH CORONARY ANGIOGRAM N/A 10/09/2013   Procedure: LEFT HEART CATHETERIZATION  WITH CORONARY ANGIOGRAM;  Surgeon: [name], MD;  Location: [facility];  Service: Cardiovascular;  Laterality: N/A;   STERNAL WIRES REMOVAL N/A 04/13/2014   Procedure: STERNAL WIRES REMOVAL;  Surgeon: [name], MD;  Location: [facility];  Service: Thoracic;  Laterality: N/A;   THYROIDECTOMY     TONSILLECTOMY     TUBAL LIGATION     VAGINAL DELIVERY     3   VISCERAL ARTERY INTERVENTION N/A 08/16/2016   Procedure: Visceral Artery Intervention;  Surgeon: Algernon Huxley, MD;  Location: [place] [facility];  Service: Cardiovascular;  Laterality: N/A;    SOCIAL HISTORY: Social History   Socioeconomic History   Marital status: Married    Spouse name: Not on  file   Number of children: 3   Years of education: Not on file   Highest education level: Not on file  Occupational History   Occupation: Retired    Fish farm manager: UNEMPLOYED    Comment: CNA  Tobacco Use   Smoking status: Former Smoker    Packs/day: 0.50    Years: 30.00    Pack years: 15.00    Types: Cigarettes    Quit date: 10/02/2013    Years since quitting: 6.3   Smokeless tobacco: Never Used  Vaping Use   Vaping Use: Never used  Substance and Sexual Activity   Alcohol use: No   Drug use: No   Sexual activity: Not Currently  Other Topics Concern   Not on file  Social History Narrative   Does not have Living Will   Desires CPR, would not want prolonged life support if futile.   Social Determinants of Health   Financial Resource Strain:    Difficulty of Paying Living Expenses: Not on file  Food Insecurity:    Worried About Charity fundraiser in the Last Year: Not on file   [employer] of Food in the Last Year: Not on file  Transportation Needs:    Lack of Transportation (Medical): Not on file   Lack of Transportation (Non-Medical): Not on file  Physical Activity:    Days of Exercise per Week: Not on file   Minutes of Exercise per Session: Not on file  Stress:    Feeling of Stress : Not on file  Social Connections:    Frequency of Communication with Friends and Family: Not on file   Frequency of Social Gatherings with Friends and Family: Not on file   Attends Religious Services: Not on file   Active Member of Clubs or Organizations: Not on file   Attends Archivist Meetings: Not on file   Marital Status: Not on file  Intimate Partner Violence:    Fear of Current or Ex-Partner: Not on file   Emotionally Abused: Not on file   Physically Abused: Not on file   Sexually Abused: Not on file    FAMILY HISTORY: Family History  Problem Relation Age of Onset   Breast cancer Mother 19   Hypertension Father    Mesothelioma Father     Asthma Father    Stroke Paternal Grandfather    Heart disease Other    Breast cancer Maternal Aunt    Breast cancer Paternal Aunt     ALLERGIES:  is allergic to amitriptyline, benadryl [diphenhydramine], demerol [meperidine], gabapentin, loratadine, meperidine hcl, mirtazapine, olanzapine, voltaren [diclofenac sodium], zetia [ezetimibe], ativan [lorazepam], atorvastatin, budesonide-formoterol fumarate, bupropion hcl, caffeine, codeine sulfate, lisinopril, metformin, mometasone furoate, morphine sulfate, other, oxycodone-acetaminophen, pioglitazone, propoxyphene n-acetaminophen, rosuvastatin,  shellfish allergy, suvorexant, ticagrelor, tramadol, trazodone and nefazodone, venlafaxine, zolpidem tartrate, and latex.  MEDICATIONS:  Current Outpatient Medications  Medication Sig Dispense Refill   budesonide (PULMICORT) 0.5 MG/2ML nebulizer solution Take 0.5 mg by nebulization daily.      Calcium-Vitamin D (CALTRATE 600 PLUS-VIT D PO) Take 1 tablet by mouth 2 (two) times daily.      DULoxetine (CYMBALTA) 20 MG capsule TAKE 1 CAPSULE BY MOUTH ONCE DAILY FOR 7 DAYS AND THEN INCREASE TO 2 CAPSULES DAILY WITH BREAKFAST     FLUoxetine (PROZAC) 40 MG capsule Take by mouth.     furosemide (LASIX) 20 MG tablet Take 20 mg by mouth daily as needed for fluid or edema.      insulin NPH-regular Human (NOVOLIN 70/30) (70-30) 100 UNIT/ML injection Inject 70 Units into the skin 2 (two) times daily with a meal. Inject 70u under the skin every morning at breakfast and inject 70u under the skin every evening with supper     levothyroxine (SYNTHROID, LEVOTHROID) 125 MCG tablet Take 125 mcg by mouth daily.      metFORMIN (GLUCOPHAGE) 1000 MG tablet Take 1,000 mg by mouth 2 (two) times daily with a meal.     nitroGLYCERIN (NITROSTAT) 0.4 MG SL tablet Place 1 tablet (0.4 mg total) under the tongue every 5 (five) minutes as needed for chest pain. Please make overdue appt with Dr. Angelena Form before anymore refills.  1st attempt 25 tablet 0   olmesartan (BENICAR) 20 MG tablet Take 20 mg by mouth at bedtime.     ondansetron (ZOFRAN ODT) 4 MG disintegrating tablet Take 1 tablet (4 mg total) by mouth every 6 (six) hours as needed for nausea or vomiting. 20 tablet 0   pantoprazole (PROTONIX) 20 MG tablet Take 20 mg by mouth daily.     promethazine (PHENERGAN) 25 MG tablet Take 25 mg by mouth every 6 (six) hours as needed for nausea or vomiting.     traZODone (DESYREL) 100 MG tablet Take 100 mg by mouth at bedtime.      ARIPiprazole (ABILIFY) 2 MG tablet Take 2 mg by mouth daily.     citalopram (CELEXA) 20 MG tablet Take 20 mg by mouth daily.     citalopram (CELEXA) 40 MG tablet Take 40 mg by mouth every morning.     doxycycline (VIBRAMYCIN) 100 MG capsule Take 1 capsule (100 mg total) by mouth 2 (two) times daily. (Patient not taking: Reported on 03/13/2019) 20 capsule 0   Potassium Chloride ER 20 MEQ TBCR Take 1 tablet by mouth daily.     ranolazine (RANEXA) 500 MG 12 hr tablet Take 1 tablet (500 mg total) by mouth 2 (two) times daily. 60 tablet 6   Current Facility-Administered Medications  Medication Dose Route Frequency Provider Last Rate Last Admin   umeclidinium bromide (INCRUSE ELLIPTA) 62.5 MCG/INH 1 puff  1 puff Inhalation Daily Kasa, Kurian, MD         PHYSICAL EXAMINATION: ECOG PERFORMANCE STATUS: 1 - Symptomatic but completely ambulatory Vitals:   02/17/20 1312  BP: 135/76  Pulse: 75  Resp: 20  Temp: 99.1 F (37.3 C)   There were no vitals filed for this visit. Physical Exam Constitutional:      General: She is not in acute distress.    Appearance: She is obese.  HENT:     Head: Normocephalic and atraumatic.  Eyes:     General: No scleral icterus.    Pupils: Pupils are equal, round, and reactive to  light.  Cardiovascular:     Rate and Rhythm: Normal rate and regular rhythm.     Heart sounds: Murmur heard.   Pulmonary:     Effort: Pulmonary effort is normal. No  respiratory distress.     Breath sounds: No wheezing.  Abdominal:     General: Bowel sounds are normal. There is no distension.     Palpations: Abdomen is soft. There is no mass.     Tenderness: There is no abdominal tenderness.  Musculoskeletal:        General: No deformity. Normal range of motion.     Cervical back: Normal range of motion and neck supple.     Comments: Bilateral lower extremity trace edema  Skin:    General: Skin is warm and dry.     Coloration: Skin is not pale.     Findings: No erythema or rash.  Neurological:     Mental Status: She is alert and oriented to person, place, and time. Mental status is at baseline.     Cranial Nerves: No cranial nerve deficit.     Coordination: Coordination normal.  Psychiatric:        Mood and Affect: Mood normal.       CMP Latest Ref Rng & Units 01/30/2019  Glucose 70 - 99 mg/dL 149(H)  BUN 8 - 23 mg/dL 17  Creatinine 0.44 - 1.00 mg/dL 1.09(H)  Sodium 135 - 145 mmol/L 139  Potassium 3.5 - 5.1 mmol/L 4.6  Chloride 98 - 111 mmol/L 109  CO2 22 - 32 mmol/L 20(L)  Calcium 8.9 - 10.3 mg/dL 8.3(L)  Total Protein 6.5 - 8.1 g/dL 7.0  Total Bilirubin 0.3 - 1.2 mg/dL 0.4  Alkaline Phos 38 - 126 U/L 74  AST 15 - 41 U/L 30  ALT 0 - 44 U/L 18   CBC Latest Ref Rng & Units 02/15/2020  WBC 4.0 - 10.5 K/uL 7.3  Hemoglobin 12.0 - 15.0 g/dL 9.9(L)  Hematocrit 36 - 46 % 30.7(L)  Platelets 150 - 400 K/uL 141(L)     LABORATORY DATA:  I have reviewed the data as listed Lab Results  Component Value Date   WBC 7.3 02/15/2020   HGB 9.9 (L) 02/15/2020   HCT 30.7 (L) 02/15/2020   MCV 88.0 02/15/2020   PLT 141 (L) 02/15/2020   No results for input(s): NA, K, CL, CO2, GLUCOSE, BUN, CREATININE, CALCIUM, GFRNONAA, GFRAA, PROT, ALBUMIN, AST, ALT, ALKPHOS, BILITOT, BILIDIR, IBILI in the last 8760 hours. Iron/TIBC/Ferritin/ %Sat    Component Value Date/Time   IRON 37 02/15/2020 1300   IRON 41 (L) 03/31/2014 1417   TIBC 276 02/15/2020 1300    TIBC 264 03/31/2014 1417   FERRITIN 135 02/15/2020 1300   FERRITIN 406 (H) 03/31/2014 1417   IRONPCTSAT 13 02/15/2020 1300   IRONPCTSAT 16 03/31/2014 1417     No results found.    ASSESSMENT & PLAN:  1. Iron deficiency anemia due to chronic blood loss   2. Anemia due to stage 3a chronic kidney disease    Labs are reviewed and discussed with patient.  Hemoglobin has dropped to 9.9.  Patient also feels very fatigued. I will proceed with IV Venofer 200 mg x 1 and repeat another dose in 2 weeks.  And hopefully that will further improve the iron store. I discussed with patient that I feel the fatigue is not proportionate to her hemoglobin level.  Etiology of the iron deficiency, possible due to chronic blood loss.  She has  a history of GI bleed.  She has not had any EGD/colonoscopy since 2014.  I recommend patient to establish care with gastroenterology.  Patient adamantly declines about any endoscopy procedures and GI evaluation.  Anemia of CKD, hemoglobin is above 10.  I will hold off erythropoietin given that hemoglobin is close to 10.   No orders of the defined types were placed in this encounter.   All questions were answered. The patient knows to call the clinic with any problems questions or concerns.  [place]  Return of visit: 3 months   Earlie Server, MD, PhD Hematology Oncology [facility] Pager- [phone] 02/17/2020

## 2020-03-03 ENCOUNTER — Inpatient Hospital Stay: Payer: Medicare HMO

## 2020-03-03 ENCOUNTER — Other Ambulatory Visit: Payer: Self-pay

## 2020-03-03 VITALS — BP 114/66 | HR 81

## 2020-03-03 DIAGNOSIS — D500 Iron deficiency anemia secondary to blood loss (chronic): Secondary | ICD-10-CM | POA: Diagnosis not present

## 2020-03-03 MED ORDER — SODIUM CHLORIDE 0.9 % IV SOLN
INTRAVENOUS | Status: DC
  Filled 2020-03-03: qty 250

## 2020-03-03 MED ORDER — IRON SUCROSE 20 MG/ML IV SOLN
200.0000 mg | Freq: Once | INTRAVENOUS | Status: AC
  Administered 2020-03-03: 200 mg via INTRAVENOUS
  Filled 2020-03-03: qty 10

## 2020-03-15 ENCOUNTER — Other Ambulatory Visit: Payer: Self-pay | Admitting: Family Medicine

## 2020-03-15 DIAGNOSIS — R053 Chronic cough: Secondary | ICD-10-CM

## 2020-03-25 ENCOUNTER — Other Ambulatory Visit: Payer: Self-pay

## 2020-03-25 ENCOUNTER — Ambulatory Visit
Admission: RE | Admit: 2020-03-25 | Discharge: 2020-03-25 | Disposition: A | Discharge status: Home / Self Care | Payer: Medicare HMO | Source: Ambulatory Visit | Attending: Family Medicine | Admitting: Family Medicine

## 2020-03-25 DIAGNOSIS — R911 Solitary pulmonary nodule: Secondary | ICD-10-CM | POA: Insufficient documentation

## 2020-03-25 DIAGNOSIS — Z951 Presence of aortocoronary bypass graft: Secondary | ICD-10-CM | POA: Insufficient documentation

## 2020-03-25 DIAGNOSIS — R918 Other nonspecific abnormal finding of lung field: Secondary | ICD-10-CM | POA: Diagnosis not present

## 2020-03-25 DIAGNOSIS — R053 Chronic cough: Secondary | ICD-10-CM | POA: Insufficient documentation

## 2020-03-25 MED ORDER — IOHEXOL 300 MG/ML  SOLN
75.0000 mL | Freq: Once | INTRAMUSCULAR | Status: AC | PRN
  Administered 2020-03-25: 75 mL via INTRAVENOUS

## 2020-04-26 ENCOUNTER — Other Ambulatory Visit: Payer: Self-pay | Admitting: *Deleted

## 2020-04-26 ENCOUNTER — Inpatient Hospital Stay: Payer: Medicare HMO | Attending: Oncology

## 2020-04-26 ENCOUNTER — Inpatient Hospital Stay (HOSPITAL_BASED_OUTPATIENT_CLINIC_OR_DEPARTMENT_OTHER): Payer: Medicare HMO | Admitting: Oncology

## 2020-04-26 ENCOUNTER — Telehealth: Payer: Self-pay | Admitting: *Deleted

## 2020-04-26 VITALS — BP 126/71 | HR 75 | Temp 97.8°F | Resp 18

## 2020-04-26 DIAGNOSIS — I252 Old myocardial infarction: Secondary | ICD-10-CM | POA: Insufficient documentation

## 2020-04-26 DIAGNOSIS — E039 Hypothyroidism, unspecified: Secondary | ICD-10-CM | POA: Diagnosis not present

## 2020-04-26 DIAGNOSIS — D509 Iron deficiency anemia, unspecified: Secondary | ICD-10-CM | POA: Insufficient documentation

## 2020-04-26 DIAGNOSIS — J449 Chronic obstructive pulmonary disease, unspecified: Secondary | ICD-10-CM | POA: Diagnosis not present

## 2020-04-26 DIAGNOSIS — E1122 Type 2 diabetes mellitus with diabetic chronic kidney disease: Secondary | ICD-10-CM | POA: Insufficient documentation

## 2020-04-26 DIAGNOSIS — R531 Weakness: Secondary | ICD-10-CM | POA: Insufficient documentation

## 2020-04-26 DIAGNOSIS — D500 Iron deficiency anemia secondary to blood loss (chronic): Secondary | ICD-10-CM

## 2020-04-26 DIAGNOSIS — K219 Gastro-esophageal reflux disease without esophagitis: Secondary | ICD-10-CM | POA: Insufficient documentation

## 2020-04-26 DIAGNOSIS — E1136 Type 2 diabetes mellitus with diabetic cataract: Secondary | ICD-10-CM | POA: Insufficient documentation

## 2020-04-26 DIAGNOSIS — I130 Hypertensive heart and chronic kidney disease with heart failure and stage 1 through stage 4 chronic kidney disease, or unspecified chronic kidney disease: Secondary | ICD-10-CM | POA: Insufficient documentation

## 2020-04-26 DIAGNOSIS — N189 Chronic kidney disease, unspecified: Secondary | ICD-10-CM | POA: Diagnosis not present

## 2020-04-26 LAB — CBC WITH DIFFERENTIAL/PLATELET
Abs Immature Granulocytes: 0.05 10*3/uL (ref 0.00–0.07)
Basophils Absolute: 0.1 10*3/uL (ref 0.0–0.1)
Basophils Relative: 1 %
Eosinophils Absolute: 0.3 10*3/uL (ref 0.0–0.5)
Eosinophils Relative: 4 %
HCT: 31.6 % — ABNORMAL LOW (ref 36.0–46.0)
Hemoglobin: 10.2 g/dL — ABNORMAL LOW (ref 12.0–15.0)
Immature Granulocytes: 1 %
Lymphocytes Relative: 22 %
Lymphs Abs: 1.8 10*3/uL (ref 0.7–4.0)
MCH: 29.1 pg (ref 26.0–34.0)
MCHC: 32.3 g/dL (ref 30.0–36.0)
MCV: 90.3 fL (ref 80.0–100.0)
Monocytes Absolute: 0.6 10*3/uL (ref 0.1–1.0)
Monocytes Relative: 7 %
Neutro Abs: 5.4 10*3/uL (ref 1.7–7.7)
Neutrophils Relative %: 65 %
Platelets: 158 10*3/uL (ref 150–400)
RBC: 3.5 MIL/uL — ABNORMAL LOW (ref 3.87–5.11)
RDW: 14.3 % (ref 11.5–15.5)
WBC: 8 10*3/uL (ref 4.0–10.5)
nRBC: 0 % (ref 0.0–0.2)

## 2020-04-26 LAB — IRON AND TIBC
Iron: 41 ug/dL (ref 28–170)
Saturation Ratios: 16 % (ref 10.4–31.8)
TIBC: 258 ug/dL (ref 250–450)
UIBC: 217 ug/dL

## 2020-04-26 LAB — FERRITIN: Ferritin: 294 ng/mL (ref 11–307)

## 2020-04-26 NOTE — Telephone Encounter (Signed)
I called patient back. Spoke to husband. He is agreeable to bring wife in at 2 pm for labs and to see NP this afternoon at [facility]. Appts will be scheduled.

## 2020-04-26 NOTE — Progress Notes (Signed)
In [facility] . Pt requested a visit due to feeling fatigued and tired all the time. Pt didn't realize she had an appt coming up on Dec 12 th with MD. Pt states she does have some dyspnea with exertion as she has COPD. Pt denies any blood seen in stool.

## 2020-04-26 NOTE — Telephone Encounter (Signed)
Spoke to patient. Please schedule her for lab (cbc, ferritin, iron studies, hold tube), and [facility]. Thanks!

## 2020-04-26 NOTE — Telephone Encounter (Signed)
Patient of Dr Tasia Catchings called reporting weakness and feeling tired. Her next appointment for lab/MD/ venofer is 12/15. She is asking to come in ASAP. Please advise

## 2020-04-26 NOTE — Progress Notes (Signed)
Symptom Management Consult note [facility]  [phone] [phone]  Patient Care Team: [place] as PCP - General Angelena Form, Annita Brod, MD as PCP - Cardiology (Cardiology) Elsie Stain, MD as Attending Physician (Pulmonary Disease) [name], MD as Consulting Physician (Cardiology) Gabriel Carina Betsey Holiday, MD as Physician Assistant (Internal Medicine) [name], MD as Consulting Physician (Cardiothoracic Surgery) Earlie Server, MD as Consulting Physician (Hematology and Oncology)   Name of the patient: [name]  [id number]  1945/12/22   Date of visit: 04/26/2020   Diagnosis-IDA  Chief complaint/ Reason for visit- weakness  Heme/Onc history:  Oncology History   No history exists.   Interval history-[name] is a 74 year old female with past medical history significant for chronic kidney disease, non-STEMI, CHF, sleep apnea, COPD, GERD, hypothyroidism, diabetes and iron deficiency anemia dating back to 2009.  She reported a history of GI bleeding. Colonoscopy/endoscopy from 2014 showed a single solitary 12 mm ulcer in the descending colon.  Some bleeding was present.  She also had diverticulum along with possible Barrett's esophagus.  Bleeds were cauterized.  She has since refused additional GI work-up.  She has received intermittent IV Venofer for the past couple years.  She last received Venofer on 02/17/2020 and 03/03/2020.  Today, she reports feeling fatigued and weak.  She has underlying COPD and describes associated shortness of breath especially with exertion.  She has trouble ambulating and has to steady herself using the walls.  This is a chronic problem.  She denies any dizziness or recent falls.  States she overall feels more tired and is wondering if she might need additional iron at this time.  She denies any active bleeding.  Reports normal bowel movements.  ECOG FS:2 - Symptomatic, <50% confined to  bed  Review of systems- Review of Systems  Constitutional: Positive for malaise/fatigue. Negative for chills, fever and weight loss.  HENT: Negative for congestion, ear pain and tinnitus.   Eyes: Negative.  Negative for blurred vision and double vision.  Respiratory: Positive for shortness of breath. Negative for cough and sputum production.   Cardiovascular: Negative.  Negative for chest pain, palpitations and leg swelling.  Gastrointestinal: Negative.  Negative for abdominal pain, constipation, diarrhea, nausea and vomiting.  Genitourinary: Negative for dysuria, frequency and urgency.  Musculoskeletal: Negative for back pain and falls.  Skin: Negative.  Negative for rash.  Neurological: Positive for weakness. Negative for headaches.  Endo/Heme/Allergies: Negative.  Does not bruise/bleed easily.  Psychiatric/Behavioral: Negative.  Negative for depression. The patient is not nervous/anxious and does not have insomnia.      Current treatment-intermittent IV Venofer  Allergies  Allergen Reactions  . Amitriptyline Other (See Comments)    Unknown reaction  . Benadryl [Diphenhydramine] Shortness Of Breath  . Demerol [Meperidine] Other (See Comments)    Unknown reaction  . Gabapentin Other (See Comments)    Unknown reaction  . Loratadine Other (See Comments)    Unknown reaction  . Meperidine Hcl Other (See Comments)    Unknown reaction  . Mirtazapine Other (See Comments)    Unknown reaction  . Olanzapine Other (See Comments)    Unknown reaction   . Voltaren [Diclofenac Sodium] Shortness Of Breath  . Zetia [Ezetimibe] Other (See Comments)    Weakness in legs, shakiness all over  . Ativan [Lorazepam] Other (See Comments)    Causes double vision at highter than .5 mg dose  . Atorvastatin Other (See Comments)  Muscle aches and weakness  . Budesonide-Formoterol Fumarate Other (See Comments)    Shakiness, tremors  . Bupropion Hcl Other (See Comments)    "cloud over me" depression   . Caffeine Other (See Comments)    jitters  . Codeine Sulfate Other (See Comments)    Makes chest hurt like a heart attack  . Lisinopril Cough  . Metformin Nausea And Vomiting  . Mometasone Furoate Nausea And Vomiting  . Morphine Sulfate Other (See Comments)    Chest pain like a heart attack  . Other Other (See Comments)    Beta Blockers, reaction shortness of breath  . Oxycodone-Acetaminophen Nausea And Vomiting  . Pioglitazone Other (See Comments)    Cannot take because of risk of bladder cancer  . Propoxyphene N-Acetaminophen Nausea And Vomiting  . Rosuvastatin Other (See Comments)    Muscle aches and weakness  . Shellfish Allergy Diarrhea  . Suvorexant Other (See Comments)    Jerking/nervous   . Ticagrelor     Other reaction(s): Other (See Comments) "slowed heart rate" & chest pain  . Tramadol Nausea Only  . Trazodone And Nefazodone Nausea And Vomiting  . Venlafaxine Other (See Comments)    Unknown reaction  . Zolpidem Tartrate Other (See Comments)     Jittery, diarrhea  . Latex Rash     Past Medical History:  Diagnosis Date  . 1st degree AV block   . ACE-inhibitor cough   . Allergic rhinitis   . Anemia    iron deficiency anemia  . Anxiety   . Aortic ectasia (HCC)    a. CT abd in 12/2016 incidentally noted aortic atherosclerosis and infrarenal abdominal aortic ectasia measuring as large as 2.7 cm with recommendation to repeat US in 2023.  Marland Kitchen Arthritis   . Asthma   . Cataract   . Chronic depression   . Chronic diastolic CHF (congestive heart failure) ([place])   . Chronic headache   . COPD (chronic obstructive pulmonary disease) ([place])   . Coronary artery disease    a. DES to RCA and mid Cx 2009. b. CABG and bioprosthetic AVR May 2015. c. cutting balloon to prox Cx in 05/2016  . Diabetes mellitus    type 2  . Diverticulitis of colon   . Essential hypertension   . GERD (gastroesophageal reflux disease)   . Hearing loss   . History of blood transfusion 2013  .  History of prosthetic aortic valve replacement   . HOH (hard of hearing)   . Hypercholesterolemia    intolerance of statins and niaspan  . IDA (iron deficiency anemia) 02/03/2019  . Mobitz type 1 second degree AV block   . OSA (obstructive sleep apnea)    mild, intolerant of cpap  . PAD (peripheral artery disease) ([place])    a. atherosclerosis by CT abd 12/2016 in [place].  Marland Kitchen PONV (postoperative nausea and vomiting)   . Statin intolerance   . Thyroid disease      Past Surgical History:  Procedure Laterality Date  . ABDOMINAL HYSTERECTOMY    . ABDOMINAL HYSTERECTOMY W/ PARTIAL VAGINACTOMY    . AORTIC VALVE REPLACEMENT N/A 10/12/2013   Procedure: AORTIC VALVE REPLACEMENT (AVR);  Surgeon: [name], MD;  Location: [facility];  Service: Open Heart Surgery;  Laterality: N/A;  . [place]  . BARTHOLIN GLAND CYST EXCISION    . BLADDER SUSPENSION    . BREAST BIOPSY Bilateral 09/11/2000   neg  . BREAST BIOPSY Left 07/24/2010   neg  .  BREAST CYST EXCISION  1988   bilateral nonmalignant tumors, x3  . CARDIAC CATHETERIZATION    . CARDIAC CATHETERIZATION N/A 05/25/2016   Procedure: Coronary Balloon Angioplasty;  Surgeon: Leonie Man, MD;  Location: [facility];  Service: Cardiovascular;  Laterality: N/A;  . CARDIAC CATHETERIZATION N/A 05/25/2016   Procedure: Coronary/Graft Angiography;  Surgeon: Leonie Man, MD;  Location: [place];  Service: Cardiovascular;  Laterality: N/A;  . CATARACT EXTRACTION W/ INTRAOCULAR LENS  IMPLANT, BILATERAL    . CHOLECYSTECTOMY  2001  . COLECTOMY     lap sigmoid  . COLONOSCOPY  2014   polyps found, 2 clamped off.  . CORONARY ANGIOPLASTY  10/29/2007   Prox RCA & Mid Cx.  . CORONARY ARTERY BYPASS GRAFT N/A 10/12/2013   Procedure: CORONARY ARTERY BYPASS GRAFT TIMES TWO;  Surgeon: [name], MD;  Location: [facility];  Service: Open Heart Surgery;  Laterality: N/A;  . CORONARY/GRAFT ANGIOGRAPHY N/A 09/20/2017   Procedure: CORONARY/GRAFT  ANGIOGRAPHY;  Surgeon: Sherren Mocha, MD;  Location: [place] [facility];  Service: Cardiovascular;  Laterality: N/A;  . LEFT HEART CATHETERIZATION WITH CORONARY ANGIOGRAM N/A 10/09/2013   Procedure: LEFT HEART CATHETERIZATION WITH CORONARY ANGIOGRAM;  Surgeon: [name], MD;  Location: [facility];  Service: Cardiovascular;  Laterality: N/A;  . STERNAL WIRES REMOVAL N/A 04/13/2014   Procedure: STERNAL WIRES REMOVAL;  Surgeon: [name], MD;  Location: [facility];  Service: Thoracic;  Laterality: N/A;  . THYROIDECTOMY    . TONSILLECTOMY    . TUBAL LIGATION    . VAGINAL DELIVERY     3  . VISCERAL ARTERY INTERVENTION N/A 08/16/2016   Procedure: Visceral Artery Intervention;  Surgeon: Algernon Huxley, MD;  Location: [place] [facility];  Service: Cardiovascular;  Laterality: N/A;    Social History   Socioeconomic History  . Marital status: Married    Spouse name: Not on file  . Number of children: 3  . Years of education: Not on file  . Highest education level: Not on file  Occupational History  . Occupation: Retired    Fish farm manager: UNEMPLOYED    Comment: CNA  Tobacco Use  . Smoking status: Former Smoker    Packs/day: 0.50    Years: 30.00    Pack years: 15.00    Types: Cigarettes    Quit date: 10/02/2013    Years since quitting: 6.5  . Smokeless tobacco: Never Used  Vaping Use  . Vaping Use: Never used  Substance and Sexual Activity  . Alcohol use: No  . Drug use: No  . Sexual activity: Not Currently  Other Topics Concern  . Not on file  Social History Narrative   Does not have Living Will   Desires CPR, would not want prolonged life support if futile.   Social Determinants of Health   Financial Resource Strain:   . Difficulty of Paying Living Expenses: Not on file  Food Insecurity:   . Worried About Charity fundraiser in the Last Year: Not on file  . Ran Out of Food in the Last Year: Not on file  Transportation Needs:   . Lack of Transportation (Medical): Not  on file  . Lack of Transportation (Non-Medical): Not on file  Physical Activity:   . Days of Exercise per Week: Not on file  . Minutes of Exercise per Session: Not on file  Stress:   . Feeling of Stress : Not on file  Social Connections:   .  Frequency of Communication with Friends and Family: Not on file  . Frequency of Social Gatherings with Friends and Family: Not on file  . Attends Religious Services: Not on file  . Active Member of Clubs or Organizations: Not on file  . Attends Archivist Meetings: Not on file  . Marital Status: Not on file  Intimate Partner Violence:   . Fear of Current or Ex-Partner: Not on file  . Emotionally Abused: Not on file  . Physically Abused: Not on file  . Sexually Abused: Not on file    Family History  Problem Relation Age of Onset  . Breast cancer Mother 33  . Hypertension Father   . Mesothelioma Father   . Asthma Father   . Stroke Paternal Grandfather   . Heart disease Other   . Breast cancer Maternal Aunt   . Breast cancer Paternal Aunt      Current Outpatient Medications:  .  budesonide (PULMICORT) 0.5 MG/2ML nebulizer solution, Take 0.5 mg by nebulization daily. , Disp: , Rfl:  .  Calcium-Vitamin D (CALTRATE 600 PLUS-VIT D PO), Take 1 tablet by mouth 2 (two) times daily. , Disp: , Rfl:  .  citalopram (CELEXA) 40 MG tablet, Take 40 mg by mouth every morning., Disp: , Rfl:  .  DULoxetine (CYMBALTA) 20 MG capsule, TAKE 1 CAPSULE BY MOUTH ONCE DAILY FOR 7 DAYS AND THEN INCREASE TO 2 CAPSULES DAILY WITH BREAKFAST, Disp: , Rfl:  .  FLUoxetine (PROZAC) 40 MG capsule, Take by mouth., Disp: , Rfl:  .  furosemide (LASIX) 20 MG tablet, Take 20 mg by mouth daily as needed for fluid or edema. , Disp: , Rfl:  .  insulin NPH-regular Human (NOVOLIN 70/30) (70-30) 100 UNIT/ML injection, Inject 70 Units into the skin 2 (two) times daily with a meal. Inject 70u under the skin every morning at breakfast and inject 70u under the skin every evening  with supper, Disp: , Rfl:  .  levothyroxine (SYNTHROID, LEVOTHROID) 125 MCG tablet, Take 125 mcg by mouth daily. , Disp: , Rfl:  .  metFORMIN (GLUCOPHAGE) 1000 MG tablet, Take 1,000 mg by mouth 2 (two) times daily with a meal., Disp: , Rfl:  .  nitroGLYCERIN (NITROSTAT) 0.4 MG SL tablet, Place 1 tablet (0.4 mg total) under the tongue every 5 (five) minutes as needed for chest pain. Please make overdue appt with Dr. Angelena Form before anymore refills. 1st attempt, Disp: 25 tablet, Rfl: 0 .  ondansetron (ZOFRAN ODT) 4 MG disintegrating tablet, Take 1 tablet (4 mg total) by mouth every 6 (six) hours as needed for nausea or vomiting., Disp: 20 tablet, Rfl: 0 .  pantoprazole (PROTONIX) 20 MG tablet, Take 20 mg by mouth daily., Disp: , Rfl:  .  ranolazine (RANEXA) 500 MG 12 hr tablet, Take 1 tablet (500 mg total) by mouth 2 (two) times daily., Disp: 60 tablet, Rfl: 6 .  traZODone (DESYREL) 100 MG tablet, Take 100 mg by mouth at bedtime. , Disp: , Rfl:  .  ARIPiprazole (ABILIFY) 2 MG tablet, Take 2 mg by mouth daily., Disp: , Rfl:  .  promethazine (PHENERGAN) 25 MG tablet, Take 25 mg by mouth every 6 (six) hours as needed for nausea or vomiting. (Patient not taking: Reported on 04/26/2020), Disp: , Rfl:   Current Facility-Administered Medications:  .  umeclidinium bromide (INCRUSE ELLIPTA) 62.5 MCG/INH 1 puff, 1 puff, Inhalation, Daily, Flora Lipps, MD  Physical exam:  Vitals:   04/26/20 1413  BP: 126/71  Pulse: 75  Resp: 18  Temp: 97.8 F (36.6 C)  SpO2: 98%   Physical Exam Constitutional:      Appearance: Normal appearance.  HENT:     Head: Normocephalic and atraumatic.  Eyes:     Pupils: Pupils are equal, round, and reactive to light.  Cardiovascular:     Rate and Rhythm: Normal rate and regular rhythm.     Heart sounds: Normal heart sounds. No murmur heard.   Pulmonary:     Effort: Pulmonary effort is normal.     Breath sounds: Normal breath sounds. No wheezing.  Abdominal:      General: Bowel sounds are normal. There is no distension.     Palpations: Abdomen is soft.     Tenderness: There is no abdominal tenderness.  Musculoskeletal:        General: Normal range of motion.     Cervical back: Normal range of motion.  Skin:    General: Skin is warm and dry.     Findings: No rash.  Neurological:     Mental Status: She is alert and oriented to person, place, and time.  Psychiatric:        Judgment: Judgment normal.      CMP Latest Ref Rng & Units 01/30/2019  Glucose 70 - 99 mg/dL 149(H)  BUN 8 - 23 mg/dL 17  Creatinine 0.44 - 1.00 mg/dL 1.09(H)  Sodium 135 - 145 mmol/L 139  Potassium 3.5 - 5.1 mmol/L 4.6  Chloride 98 - 111 mmol/L 109  CO2 22 - 32 mmol/L 20(L)  Calcium 8.9 - 10.3 mg/dL 8.3(L)  Total Protein 6.5 - 8.1 g/dL 7.0  Total Bilirubin 0.3 - 1.2 mg/dL 0.4  Alkaline Phos 38 - 126 U/L 74  AST 15 - 41 U/L 30  ALT 0 - 44 U/L 18   CBC Latest Ref Rng & Units 04/26/2020  WBC 4.0 - 10.5 K/uL 8.0  Hemoglobin 12.0 - 15.0 g/dL 10.2(L)  Hematocrit 36 - 46 % 31.6(L)  Platelets 150 - 400 K/uL 158    No images are attached to the encounter.  No results found.  Assessment and plan- Patient is a 74 y.o. female who presents for generalized weakness and malaise.  Has history significant for IDA and GI bleed.  No bleeding recently.  She last received IV Venofer approximately 2 months ago.  Labs at that time showed a ferritin of 135 and saturation of 13%.  Hemoglobin 9.9.  We will repeat lab work today and check iron stores and ferritin level along with a CBC.  If she is iron deficient we will set her up for IV iron in the near future.  Disposition: We will call patient with results from lab work.  Addendum: Lab work from today show an improved hemoglobin of 10.2 and a ferritin level of 294.  I do not believe that she needs additional IV iron at this time.  Keep scheduled visit with Dr. Tasia Catchings to reassess.  If anything significant changes in the next month patient  to call clinic to be reevaluated sooner.  Based on prior IV iron infusions, she received them monthly for several months back in 2020 and 3 times this year.  I think her symptoms of fatigue and shortness of breath are due to underlying comorbidities.  There was some discussion of initiating EPO stimulating agents for her anemia such as Retacrit given her chronic kidney disease but her hemoglobin is maintained above 10 at this time.  Visit Diagnosis 1.  Iron deficiency anemia due to chronic blood loss     Patient expressed understanding and was in agreement with this plan. She also understands that She can call clinic at any time with any questions, concerns, or complaints.   Greater than 50% was spent in counseling and coordination of care with this patient including but not limited to discussion of the relevant topics above (See A&P) including, but not limited to diagnosis and management of acute and chronic medical conditions.   Thank you for allowing me to participate in the care of this very pleasant patient.    Jacquelin Hawking, NP [place] [facility] Cell - [phone] Pager- [phone] 04/27/2020 8:17 AM

## 2020-04-27 LAB — SAMPLE TO BLOOD BANK

## 2020-05-18 ENCOUNTER — Other Ambulatory Visit: Payer: Medicare HMO

## 2020-05-20 ENCOUNTER — Telehealth: Payer: Self-pay | Admitting: Oncology

## 2020-05-20 ENCOUNTER — Ambulatory Visit: Payer: Medicare HMO

## 2020-05-20 ENCOUNTER — Ambulatory Visit: Payer: Medicare HMO | Admitting: Oncology

## 2020-05-20 NOTE — Telephone Encounter (Signed)
Call returned to patient She reports that she came in for labs and her "Iron level was 10 and 12 is normal but they did not do anything". She states that she is getting worse with dizziness and weaker. She is not able to function well and has to take care of her husband. Please advise

## 2020-05-20 NOTE — Telephone Encounter (Signed)
Please move her appointment to January.  She was seen by Sonia Baller recently and iron panel showed good iron level. Hb is above 10, increased from her level in September.  It is not normalized because she has anemia from chronic kidney disease. I will try to discuss more when she sees me.  Thanks.

## 2020-05-20 NOTE — Telephone Encounter (Signed)
Hassan Rowan, can you call and triage this message?

## 2020-05-20 NOTE — Telephone Encounter (Signed)
Call returned to patient and advised of Dr Collie Siad response. She was very short with her response and said "alright"

## 2020-05-20 NOTE — Telephone Encounter (Signed)
Done.. Pt 07/25/20 ans 07/28/19 appts were move to January per MD  Pt was made aware

## 2020-05-20 NOTE — Telephone Encounter (Signed)
dizziness and weak symptoms are not proportionate to her Hb level. Recommend her to update her PCP and see if other reason could have caused her symptoms. Thanks.

## 2020-06-04 ENCOUNTER — Other Ambulatory Visit: Payer: Self-pay

## 2020-06-04 ENCOUNTER — Emergency Department
Admission: EM | Admit: 2020-06-04 | Discharge: 2020-06-04 | Disposition: A | Discharge status: Home / Self Care | Payer: Medicare HMO | Attending: Emergency Medicine | Admitting: Emergency Medicine

## 2020-06-04 ENCOUNTER — Emergency Department: Payer: Medicare HMO

## 2020-06-04 DIAGNOSIS — J441 Chronic obstructive pulmonary disease with (acute) exacerbation: Secondary | ICD-10-CM

## 2020-06-04 DIAGNOSIS — J449 Chronic obstructive pulmonary disease, unspecified: Secondary | ICD-10-CM | POA: Insufficient documentation

## 2020-06-04 DIAGNOSIS — E1122 Type 2 diabetes mellitus with diabetic chronic kidney disease: Secondary | ICD-10-CM | POA: Insufficient documentation

## 2020-06-04 DIAGNOSIS — Z794 Long term (current) use of insulin: Secondary | ICD-10-CM | POA: Diagnosis not present

## 2020-06-04 DIAGNOSIS — Z951 Presence of aortocoronary bypass graft: Secondary | ICD-10-CM | POA: Insufficient documentation

## 2020-06-04 DIAGNOSIS — N1831 Chronic kidney disease, stage 3a: Secondary | ICD-10-CM | POA: Insufficient documentation

## 2020-06-04 DIAGNOSIS — Z91040 Latex allergy status: Secondary | ICD-10-CM | POA: Diagnosis not present

## 2020-06-04 DIAGNOSIS — I2510 Atherosclerotic heart disease of native coronary artery without angina pectoris: Secondary | ICD-10-CM | POA: Insufficient documentation

## 2020-06-04 DIAGNOSIS — I130 Hypertensive heart and chronic kidney disease with heart failure and stage 1 through stage 4 chronic kidney disease, or unspecified chronic kidney disease: Secondary | ICD-10-CM | POA: Diagnosis not present

## 2020-06-04 DIAGNOSIS — Z7951 Long term (current) use of inhaled steroids: Secondary | ICD-10-CM | POA: Diagnosis not present

## 2020-06-04 DIAGNOSIS — Z955 Presence of coronary angioplasty implant and graft: Secondary | ICD-10-CM | POA: Diagnosis not present

## 2020-06-04 DIAGNOSIS — U071 COVID-19: Secondary | ICD-10-CM | POA: Diagnosis not present

## 2020-06-04 DIAGNOSIS — Z87891 Personal history of nicotine dependence: Secondary | ICD-10-CM | POA: Diagnosis not present

## 2020-06-04 DIAGNOSIS — R0602 Shortness of breath: Secondary | ICD-10-CM | POA: Diagnosis present

## 2020-06-04 DIAGNOSIS — I5032 Chronic diastolic (congestive) heart failure: Secondary | ICD-10-CM | POA: Insufficient documentation

## 2020-06-04 LAB — CBC
HCT: 32 % — ABNORMAL LOW (ref 36.0–46.0)
Hemoglobin: 10.4 g/dL — ABNORMAL LOW (ref 12.0–15.0)
MCH: 28.3 pg (ref 26.0–34.0)
MCHC: 32.5 g/dL (ref 30.0–36.0)
MCV: 87.2 fL (ref 80.0–100.0)
Platelets: 150 10*3/uL (ref 150–400)
RBC: 3.67 MIL/uL — ABNORMAL LOW (ref 3.87–5.11)
RDW: 13.7 % (ref 11.5–15.5)
WBC: 6.7 10*3/uL (ref 4.0–10.5)
nRBC: 0 % (ref 0.0–0.2)

## 2020-06-04 LAB — BLOOD GAS, VENOUS
Acid-Base Excess: 1.1 mmol/L (ref 0.0–2.0)
Bicarbonate: 25.1 mmol/L (ref 20.0–28.0)
O2 Saturation: 87.2 %
Patient temperature: 37
pCO2, Ven: 37 mmHg — ABNORMAL LOW (ref 44.0–60.0)
pH, Ven: 7.44 — ABNORMAL HIGH (ref 7.250–7.430)
pO2, Ven: 51 mmHg — ABNORMAL HIGH (ref 32.0–45.0)

## 2020-06-04 LAB — BASIC METABOLIC PANEL
Anion gap: 12 (ref 5–15)
BUN: 11 mg/dL (ref 8–23)
CO2: 22 mmol/L (ref 22–32)
Calcium: 8.2 mg/dL — ABNORMAL LOW (ref 8.9–10.3)
Chloride: 102 mmol/L (ref 98–111)
Creatinine, Ser: 0.92 mg/dL (ref 0.44–1.00)
GFR, Estimated: >60 mL/min (ref >60)
Glucose, Bld: 158 mg/dL — ABNORMAL HIGH (ref 70–99)
Potassium: 4.3 mmol/L (ref 3.5–5.1)
Sodium: 136 mmol/L (ref 135–145)

## 2020-06-04 LAB — TROPONIN I (HIGH SENSITIVITY)
Troponin I (High Sensitivity): 21 ng/L — ABNORMAL HIGH (ref <18)
Troponin I (High Sensitivity): 24 ng/L — ABNORMAL HIGH (ref <18)

## 2020-06-04 LAB — POC SARS CORONAVIRUS 2 AG -  ED: SARS Coronavirus 2 Ag: POSITIVE — AB

## 2020-06-04 MED ORDER — METHYLPREDNISOLONE SODIUM SUCC 125 MG IJ SOLR
125.0000 mg | Freq: Once | INTRAMUSCULAR | Status: AC
  Administered 2020-06-04: 125 mg via INTRAVENOUS
  Filled 2020-06-04: qty 2

## 2020-06-04 MED ORDER — ACETAMINOPHEN 325 MG PO TABS
650.0000 mg | ORAL_TABLET | Freq: Once | ORAL | Status: AC
  Administered 2020-06-04: 650 mg via ORAL
  Filled 2020-06-04: qty 2

## 2020-06-04 MED ORDER — ONDANSETRON 4 MG PO TBDP
4.0000 mg | ORAL_TABLET | Freq: Once | ORAL | Status: AC
  Administered 2020-06-04: 4 mg via ORAL

## 2020-06-04 MED ORDER — AZITHROMYCIN 250 MG PO TABS: ORAL_TABLET | ORAL | 0 refills | Status: DC

## 2020-06-04 MED ORDER — ONDANSETRON 4 MG PO TBDP: 4.0000 mg | ORAL_TABLET | Freq: Three times a day (TID) | ORAL | 0 refills | Status: DC | PRN

## 2020-06-04 MED ORDER — IPRATROPIUM-ALBUTEROL 0.5-2.5 (3) MG/3ML IN SOLN: 3.0000 mL | Freq: Once | RESPIRATORY_TRACT | Status: DC

## 2020-06-04 MED ORDER — ALBUTEROL SULFATE (2.5 MG/3ML) 0.083% IN NEBU: 5.0000 mg | INHALATION_SOLUTION | Freq: Once | RESPIRATORY_TRACT | Status: DC

## 2020-06-04 MED ORDER — PREDNISONE 20 MG PO TABS: 40.0000 mg | ORAL_TABLET | Freq: Every day | ORAL | 0 refills | Status: DC

## 2020-06-04 NOTE — ED Notes (Signed)
E signature pad not working. Pt educated on discharge instructions and verbalized understanding.  

## 2020-06-04 NOTE — ED Triage Notes (Signed)
Pt comes with sob increasing over the past 3 or 4 days. Pt tachypnic, has a cough, fever. Hx of copd.

## 2020-06-04 NOTE — ED Notes (Signed)
Blue top sent. 

## 2020-06-04 NOTE — ED Notes (Signed)
Please call husband with update. Erskine Emery [phone]

## 2020-06-04 NOTE — ED Provider Notes (Signed)
[facility] Emergency Department Provider Note  ____________________________________________  Time seen: Approximately 10:51 PM  I have reviewed the triage vital signs and the nursing notes.   HISTORY  Chief Complaint Shortness of Breath    HPI [name] is a 75 y.o. female with a history of COPD, CHF, diabetes who comes ED complaining of gradual onset worsening shortness of breath over the past 4 days.  Associated with cough, chills.  No significant chest pain.  Also reports some nausea vomiting body aches and fatigue.  Symptoms are constant, waxing and waning, no aggravating or alleviating factors.      Past Medical History:  Diagnosis Date  . 1st degree AV block   . ACE-inhibitor cough   . Allergic rhinitis   . Anemia    iron deficiency anemia  . Anxiety   . Aortic ectasia (HCC)    a. CT abd in 12/2016 incidentally noted aortic atherosclerosis and infrarenal abdominal aortic ectasia measuring as large as 2.7 cm with recommendation to repeat US in 2023.  Marland Kitchen Arthritis   . Asthma   . Cataract   . Chronic depression   . Chronic diastolic CHF (congestive heart failure) ([place])   . Chronic headache   . COPD (chronic obstructive pulmonary disease) ([place])   . Coronary artery disease    a. DES to RCA and mid Cx 2009. b. CABG and bioprosthetic AVR May 2015. c. cutting balloon to prox Cx in 05/2016  . Diabetes mellitus    type 2  . Diverticulitis of colon   . Essential hypertension   . GERD (gastroesophageal reflux disease)   . Hearing loss   . History of blood transfusion 2013  . History of prosthetic aortic valve replacement   . HOH (hard of hearing)   . Hypercholesterolemia    intolerance of statins and niaspan  . IDA (iron deficiency anemia) 02/03/2019  . Mobitz type 1 second degree AV block   . OSA (obstructive sleep apnea)    mild, intolerant of cpap  . PAD (peripheral artery disease) ([place])    a. atherosclerosis by CT abd 12/2016 in [place].  Marland Kitchen  PONV (postoperative nausea and vomiting)   . Statin intolerance   . Thyroid disease      Patient Active Problem List   Diagnosis Date Noted  . Anemia due to stage 3a chronic kidney disease ([place]) 05/21/2019  . IDA (iron deficiency anemia) 02/03/2019  . COPD exacerbation ([place]) 12/28/2017  . Depression, major, single episode, complete remission ([place]) 04/11/2017  . 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 Aortic ectasia, abdominal ([place]) 01/01/2017  . GAD (generalized anxiety disorder) 09/26/2016  . Acute posthemorrhagic anemia 08/24/2016  . Acute respiratory failure with hypoxia ([place]) 08/24/2016  . Acute diastolic CHF (congestive heart failure) ([place]) 08/24/2016  . Hyponatremia 08/24/2016  . Leukocytosis 08/24/2016  . Fever 08/24/2016  . Diarrhea 08/24/2016  . Generalized weakness 08/24/2016  . Acute diverticulitis 08/16/2016  . Gastrointestinal hemorrhage   . Ataxia 08/09/2016  . Dizziness 08/09/2016  . Staggering gait 08/09/2016  . NSTEMI (non-ST elevated myocardial infarction) ([place])   . AVB (atrioventricular block)   . Atypical chest pain   . Unstable angina ([place]) 05/25/2016  . Palpitations 05/25/2016  . PUD - recent Rx for H.Pylori 05/25/2016  . Stenosis of coronary artery stent   . Recurrent major depressive disorder, in partial remission ([place]) 07/29/2015  . Sialadenitis 04/25/2015  . Viral URI 03/24/2015  . Hypoglycemia 08/25/2014  . Nausea without vomiting 08/25/2014  .  Dyslipidemia-statin ontol   . Long-term insulin use ([place]) 03/31/2014  . Aortic valve disorder 11/11/2013  . 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 Coronary Artery Disease s/p CABG 10/2013 10/10/2013  . Other malaise and fatigue 09/11/2013  . Alopecia 03/06/2013  . Insomnia 02/06/2013  . Gastroesophageal reflux disease without esophagitis 01/19/2013  . Dysphagia, unspecified(787.20) 01/19/2013  . Dyspnea 11/17/2012  . Weakness 11/17/2012  . Abnormal MRI of head 09/05/2012  . Depression with  anxiety 03/21/2012  . Extrinsic asthma 11/12/2011  . Anemia 06/07/2011  . Chronic obstructive pulmonary disease ([place]) 01/25/2010  . Acquired hypothyroidism 08/10/2009  . MICROSCOPIC HEMATURIA 05/07/2009  . GOITER 03/02/2009  . Essential hypertension 01/06/2009  . MEMORY LOSS 06/23/2008  . Obstructive sleep apnea 12/25/2007  . Insulin dependent diabetes mellitus ([place]) 09/09/2006  . ALLERGIC RHINITIS 09/09/2006  . DIVERTICULOSIS, COLON 09/09/2006     Past Surgical History:  Procedure Laterality Date  . ABDOMINAL HYSTERECTOMY    . ABDOMINAL HYSTERECTOMY W/ PARTIAL VAGINACTOMY    . AORTIC VALVE REPLACEMENT N/A 10/12/2013   Procedure: AORTIC VALVE REPLACEMENT (AVR);  Surgeon: [name], MD;  Location: [place];  Service: Open Heart Surgery;  Laterality: N/A;  . [place]  . BARTHOLIN GLAND CYST EXCISION    . BLADDER SUSPENSION    . BREAST BIOPSY Bilateral 09/11/2000   neg  . BREAST BIOPSY Left 07/24/2010   neg  . BREAST CYST EXCISION  1988   bilateral nonmalignant tumors, x3  . CARDIAC CATHETERIZATION    . CARDIAC CATHETERIZATION N/A 05/25/2016   Procedure: Coronary Balloon Angioplasty;  Surgeon: Leonie Man, MD;  Location: [place] [facility];  Service: Cardiovascular;  Laterality: N/A;  . CARDIAC CATHETERIZATION N/A 05/25/2016   Procedure: Coronary/Graft Angiography;  Surgeon: Leonie Man, MD;  Location: [place] [facility];  Service: Cardiovascular;  Laterality: N/A;  . CATARACT EXTRACTION W/ INTRAOCULAR LENS  IMPLANT, BILATERAL    . CHOLECYSTECTOMY  2001  . COLECTOMY     lap sigmoid  . COLONOSCOPY  2014   polyps found, 2 clamped off.  . CORONARY ANGIOPLASTY  10/29/2007   Prox RCA & Mid Cx.  . CORONARY ARTERY BYPASS GRAFT N/A 10/12/2013   Procedure: CORONARY ARTERY BYPASS GRAFT TIMES TWO;  Surgeon: [name], MD;  Location: [place];  Service: Open Heart Surgery;  Laterality: N/A;  . CORONARY/GRAFT ANGIOGRAPHY N/A 09/20/2017   Procedure: CORONARY/GRAFT  ANGIOGRAPHY;  Surgeon: Sherren Mocha, MD;  Location: [place] [facility];  Service: Cardiovascular;  Laterality: N/A;  . LEFT HEART CATHETERIZATION WITH CORONARY ANGIOGRAM N/A 10/09/2013   Procedure: LEFT HEART CATHETERIZATION WITH CORONARY ANGIOGRAM;  Surgeon: [name], MD;  Location[facility] C[facility];  Service: Cardiovascular;  Laterality: N/A;  . STERNAL WIRES REMOVAL N/A 04/13/2014   Procedure: STERNAL WIRES REMOVAL;  Surgeon: [name], MD;  Location: [facility] OR;  Service: Thoracic;  Laterality: N/A;  . THYROIDECTOMY    . TONSILLECTOMY    . TUBAL LIGATION    . VAGINAL DELIVERY     3  . VISCERAL ARTERY INTERVENTION N/A 08/16/2016   Procedure: Visceral Artery Intervention;  Surgeon: Algernon Huxley, MD;  Location: [place] [facility];  Service: Cardiovascular;  Laterality: N/A;     Prior to Admission medications   Medication Sig Start Date End Date Taking? Authorizing Provider  azithromycin (ZITHROMAX Z-PAK) 250 MG tablet Take 2 tablets (500 mg) on  Day 1,  followed by 1 tablet (250 mg) once daily  on Days 2 through 5. 06/04/20  Yes Carrie Mew, MD  ondansetron (ZOFRAN ODT) 4 MG disintegrating tablet Take 1 tablet (4 mg total) by mouth every 8 (eight) hours as needed for nausea or vomiting. 06/04/20  Yes Carrie Mew, MD  predniSONE (DELTASONE) 20 MG tablet Take 2 tablets (40 mg total) by mouth daily. 06/04/20  Yes Carrie Mew, MD  ARIPiprazole (ABILIFY) 2 MG tablet Take 2 mg by mouth daily.    [provider]  budesonide (PULMICORT) 0.5 MG/2ML nebulizer solution Take 0.5 mg by nebulization daily.     [provider]  Calcium-Vitamin D (CALTRATE 600 PLUS-VIT D PO) Take 1 tablet by mouth 2 (two) times daily.     [provider]  citalopram (CELEXA) 40 MG tablet Take 40 mg by mouth every morning. 03/30/19   [provider]  DULoxetine (CYMBALTA) 20 MG capsule TAKE 1 CAPSULE BY MOUTH ONCE DAILY FOR 7 DAYS AND THEN INCREASE TO 2 CAPSULES DAILY  WITH BREAKFAST 05/09/19   [provider]  FLUoxetine (PROZAC) 40 MG capsule Take by mouth. 06/17/19 06/16/20  [provider]  furosemide (LASIX) 20 MG tablet Take 20 mg by mouth daily as needed for fluid or edema.     [provider]  insulin NPH-regular Human (NOVOLIN 70/30) (70-30) 100 UNIT/ML injection Inject 70 Units into the skin 2 (two) times daily with a meal. Inject 70u under the skin every morning at breakfast and inject 70u under the skin every evening with supper    [provider]  levothyroxine (SYNTHROID, LEVOTHROID) 125 MCG tablet Take 125 mcg by mouth daily.  04/03/17   [provider]  metFORMIN (GLUCOPHAGE) 1000 MG tablet Take 1,000 mg by mouth 2 (two) times daily with a meal.    [provider]  nitroGLYCERIN (NITROSTAT) 0.4 MG SL tablet Place 1 tablet (0.4 mg total) under the tongue every 5 (five) minutes as needed for chest pain. Please make overdue appt with Dr. Angelena Form before anymore refills. 1st attempt 09/30/19   [name], MD  pantoprazole (PROTONIX) 20 MG tablet Take 20 mg by mouth daily. 01/23/17   [provider]  promethazine (PHENERGAN) 25 MG tablet Take 25 mg by mouth every 6 (six) hours as needed for nausea or vomiting. Patient not taking: Reported on 04/26/2020    [provider]  ranolazine (RANEXA) 500 MG 12 hr tablet Take 1 tablet (500 mg total) by mouth 2 (two) times daily. 03/05/18   Burtis Junes, NP  traZODone (DESYREL) 100 MG tablet Take 100 mg by mouth at bedtime.     [provider]     Allergies Amitriptyline, Benadryl [diphenhydramine], Demerol [meperidine], Gabapentin, Loratadine, Meperidine hcl, Mirtazapine, Olanzapine, Voltaren [diclofenac sodium], Zetia [ezetimibe], Ativan [lorazepam], Atorvastatin, Budesonide-formoterol fumarate, Bupropion hcl, Caffeine, Codeine sulfate, Lisinopril, Metformin, Mometasone furoate, Morphine sulfate, Other,  Oxycodone-acetaminophen, Pioglitazone, Propoxyphene n-acetaminophen, Rosuvastatin, Shellfish allergy, Suvorexant, Ticagrelor, Tramadol, Trazodone and nefazodone, Venlafaxine, Zolpidem tartrate, and Latex   Family History  Problem Relation Age of Onset  . Breast cancer Mother 57  . Hypertension Father   . Mesothelioma Father   . Asthma Father   . Stroke Paternal Grandfather   . Heart disease Other   . Breast cancer Maternal Aunt   . Breast cancer Paternal Aunt     Social History Social History   Tobacco Use  . Smoking status: Former Smoker    Packs/day: 0.50    Years: 30.00    Pack years: 15.00  Types: Cigarettes    Quit date: 10/02/2013    Years since quitting: 6.6  . Smokeless tobacco: Never Used  Vaping Use  . Vaping Use: Never used  Substance Use Topics  . Alcohol use: No  . Drug use: No    Review of Systems  Constitutional: Positive fever and chills.  ENT:   No sore throat. No rhinorrhea. Cardiovascular:   No chest pain or syncope. Respiratory: Positive shortness of breath cough. Gastrointestinal:   Negative for abdominal pain, positive vomiting Musculoskeletal:   Negative for focal pain or swelling All other systems reviewed and are negative except as documented above in ROS and HPI.  ____________________________________________   PHYSICAL EXAM:  VITAL SIGNS: ED Triage Vitals [06/04/20 1834]  Enc Vitals Group     BP (!) 185/74     Pulse Rate 88     Resp (!) 25     Temp 100 F (37.8 C)     Temp Source Oral     SpO2 96 %     Weight 200 lb (90.7 kg)     Height 5\' 6"  (1.676 m)     Head Circumference      Peak Flow      Pain Score 9     Pain Loc      Pain Edu?      Excl. in [place]?     Vital signs reviewed, nursing assessments reviewed.   Constitutional:   Alert and oriented. Non-toxic appearance. Eyes:   Conjunctivae are normal. EOMI. PERRL. ENT      Head:   Normocephalic and atraumatic.      Nose:   Wearing a mask.      Mouth/Throat:    Wearing a mask.      Neck:   No meningismus. Full ROM. Hematological/Lymphatic/Immunilogical:   No cervical lymphadenopathy. Cardiovascular:   RRR. Symmetric bilateral radial and DP pulses.  No murmurs. Cap refill less than 2 seconds. Respiratory:   Normal respiratory effort with mild tachypnea, no retractions or accessory muscle use.  Diffuse expiratory wheezing with good air entry and symmetric air movement. Gastrointestinal:   Soft and nontender. Non distended. There is no CVA tenderness.  No rebound, rigidity, or guarding.  Musculoskeletal:   Normal range of motion in all extremities. No joint effusions.  No lower extremity tenderness.  No edema. Neurologic:   Normal speech and language.  Motor grossly intact. No acute focal neurologic deficits are appreciated.  Skin:    Skin is warm, dry and intact. No rash noted.  No petechiae, purpura, or bullae.  ____________________________________________    LABS (pertinent positives/negatives) (all labs ordered are listed, but only abnormal results are displayed) Labs Reviewed  BASIC METABOLIC PANEL - Abnormal; Notable for the following components:      Result Value   Glucose, Bld 158 (*)    Calcium 8.2 (*)    All other components within normal limits  CBC - Abnormal; Notable for the following components:   RBC 3.67 (*)    Hemoglobin 10.4 (*)    HCT 32.0 (*)    All other components within normal limits  BLOOD GAS, VENOUS - Abnormal; Notable for the following components:   pH, Ven 7.44 (*)    pCO2, Ven 37 (*)    pO2, Ven 51.0 (*)    All other components within normal limits  POC SARS CORONAVIRUS 2 AG -  ED - Abnormal; Notable for the following components:   SARS Coronavirus 2 Ag POSITIVE (*)  All other components within normal limits  TROPONIN I (HIGH SENSITIVITY) - Abnormal; Notable for the following components:   Troponin I (High Sensitivity) 21 (*)    All other components within normal limits  TROPONIN I (HIGH SENSITIVITY) -  Abnormal; Notable for the following components:   Troponin I (High Sensitivity) 24 (*)    All other components within normal limits  SARS CORONAVIRUS 2 (TAT 6-24 HRS)   ____________________________________________   EKG  Interpreted by me Sinus rhythm rate of 85, normal axis and intervals.  Normal QRS ST segments and T waves.  Voltage criteria for LVH in the high lateral leads.  ____________________________________________    RADIOLOGY  DG Chest 2 View  Result Date: 06/04/2020 CLINICAL DATA:  Shortness of breath EXAM: CHEST - 2 VIEW COMPARISON:  01/17/2019, CT 03/25/2020 FINDINGS: Post sternotomy changes and valve prosthesis. Mild diffuse coarse interstitial opacity appears chronic to comparison study. Possible superimposed foci of vague airspace disease at the periphery of the left lung. Enlarged cardiomediastinal silhouette. No pneumothorax or pleural effusion. IMPRESSION: Chronic interstitial opacity. Possible superimposed foci of vague airspace disease/pneumonia at the periphery of the left lung. Enlarged cardiomediastinal silhouette. Electronically Signed   By: Jasmine PangKim  Fujinaga M.D.   On: 06/04/2020 19:24    ____________________________________________   PROCEDURES Procedures  ____________________________________________  DIFFERENTIAL DIAGNOSIS   COPD exacerbation, pneumonia, pleural effusion, pulmonary edema, Covid, viral syndrome  CLINICAL IMPRESSION / ASSESSMENT AND PLAN / ED COURSE  Medications ordered in the ED: Medications  acetaminophen (TYLENOL) tablet 650 mg (650 mg Oral Given 06/04/20 1944)  ondansetron (ZOFRAN-ODT) disintegrating tablet 4 mg (4 mg Oral Given 06/04/20 1845)  methylPREDNISolone sodium succinate (SOLU-MEDROL) 125 mg/2 mL injection 125 mg (125 mg Intravenous Given 06/04/20 1959)    Pertinent labs & imaging results that were available during my care of the patient were reviewed by me and considered in my medical decision making (see chart for  details).  [name] was evaluated in Emergency Department on 06/04/2020 for the symptoms described in the history of present illness. She was evaluated in the context of the global COVID-19 pandemic, which necessitated consideration that the patient might be at risk for infection with the SARS-CoV-2 virus that causes COVID-19. Institutional protocols and algorithms that pertain to the evaluation of patients at risk for COVID-19 are in a state of rapid change based on information released by regulatory bodies including the CDC and federal and state organizations. These policies and algorithms were followed during the patient's care in the ED.   Patient presents with worsening shortness of breath, consistent with COPD exacerbation.  Overall reassuring exam.  Covid test is positive with rapid antigen, but patient and her husband are convinced that this is not possible despite the current omicron surge.  We will send a PCR to the lab to confirm.  Chest x-ray shows some vague opacities, not consistent with bacterial pneumonia.  Treat with steroids, azithromycin, Zofran for likely viral syndrome.  She is not septic.      ____________________________________________   FINAL CLINICAL IMPRESSION(S) / ED DIAGNOSES    Final diagnoses:  COPD exacerbation (HCC)  COVID-19 virus infection     ED Discharge Orders         Ordered    predniSONE (DELTASONE) 20 MG tablet  Daily        06/04/20 2251    ondansetron (ZOFRAN ODT) 4 MG disintegrating tablet  Every 8 hours PRN        06/04/20 2251  azithromycin (ZITHROMAX Z-PAK) 250 MG tablet        06/04/20 2251          Portions of this note were generated with dragon dictation software. Dictation errors may occur despite best attempts at proofreading.   Sharman Cheek, MD 06/04/20 2256

## 2020-06-04 NOTE — ED Notes (Signed)
Hold off on the tylenol at this time due to vomiting

## 2020-06-04 NOTE — ED Notes (Signed)
Pt vomited x2 in triage room.

## 2020-06-04 NOTE — ED Notes (Signed)
Pt Covid positive. Dr Scotty Court notified. Received verbal orders to discontinue nebulizer medications.

## 2020-06-05 ENCOUNTER — Telehealth: Payer: Self-pay | Admitting: Physician Assistant

## 2020-06-05 LAB — SARS CORONAVIRUS 2 (TAT 6-24 HRS): SARS Coronavirus 2: POSITIVE — AB

## 2020-06-05 NOTE — Telephone Encounter (Signed)
Called to discuss with [name] about Covid symptoms and the use of  monoclonal antibody infusion for those with mild to moderate Covid symptoms and at a high risk of hospitalization.     Pt is qualified for this infusion due to co-morbid conditions and/or a member of an at-risk group, however declines infusion at this time due to lack of transportation.   Symptoms reviewed as well as criteria for ending isolation.  Symptoms reviewed that would warrant ED/Hospital evaluation. Preventative practices reviewed. Patient verbalized understanding. Patient advised to call back if he decides that he does want to get infusion. Callback number to the infusion center given. Patient advised to go to Urgent care or ED with severe symptoms  Onset 12/27. Multiple risk factors as below.  Offered Sotrovimab>> currently one slot open.   Patient Active Problem List   Diagnosis Date Noted  . Anemia due to stage 3a chronic kidney disease (HCC) 05/21/2019  . IDA (iron deficiency anemia) 02/03/2019  . COPD exacerbation (HCC) 12/28/2017  . Depression, major, single episode, complete remission (HCC) 04/11/2017  . Thoracoabdominal aneurysm (HCC) 01/02/2017  . Aortic ectasia, abdominal (HCC) 01/01/2017  . GAD (generalized anxiety disorder) 09/26/2016  . Acute posthemorrhagic anemia 08/24/2016  . Acute respiratory failure with hypoxia (HCC) 08/24/2016  . Acute diastolic CHF (congestive heart failure) (HCC) 08/24/2016  . Hyponatremia 08/24/2016  . Leukocytosis 08/24/2016  . Fever 08/24/2016  . Diarrhea 08/24/2016  . Generalized weakness 08/24/2016  . Acute diverticulitis 08/16/2016  . Gastrointestinal hemorrhage   . Ataxia 08/09/2016  . Dizziness 08/09/2016  . Staggering gait 08/09/2016  . NSTEMI (non-ST elevated myocardial infarction) (HCC)   . AVB (atrioventricular block)   . Atypical chest pain   . Unstable angina (HCC) 05/25/2016  . Palpitations 05/25/2016  . PUD - recent Rx for H.Pylori 05/25/2016   . Stenosis of coronary artery stent   . Recurrent major depressive disorder, in partial remission (HCC) 07/29/2015  . Sialadenitis 04/25/2015  . Viral URI 03/24/2015  . Hypoglycemia 08/25/2014  . Nausea without vomiting 08/25/2014  . Dyslipidemia-statin ontol   . Long-term insulin use (HCC) 03/31/2014  . Aortic valve disorder 11/11/2013  . 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 Coronary Artery Disease s/p CABG 10/2013 10/10/2013  . Other malaise and fatigue 09/11/2013  . Alopecia 03/06/2013  . Insomnia 02/06/2013  . Gastroesophageal reflux disease without esophagitis 01/19/2013  . Dysphagia, unspecified(787.20) 01/19/2013  . Dyspnea 11/17/2012  . Weakness 11/17/2012  . Abnormal MRI of head 09/05/2012  . Depression with anxiety 03/21/2012  . Extrinsic asthma 11/12/2011  . Anemia 06/07/2011  . Chronic obstructive pulmonary disease (HCC) 01/25/2010  . Acquired hypothyroidism 08/10/2009  . MICROSCOPIC HEMATURIA 05/07/2009  . GOITER 03/02/2009  . Essential hypertension 01/06/2009  . MEMORY LOSS 06/23/2008  . Obstructive sleep apnea 12/25/2007  . Insulin dependent diabetes mellitus (HCC) 09/09/2006  . ALLERGIC RHINITIS 09/09/2006  . DIVERTICULOSIS, COLON 09/09/2006    Manson Passey, PA-C.

## 2020-06-06 ENCOUNTER — Inpatient Hospital Stay: Payer: Medicare HMO

## 2020-06-08 ENCOUNTER — Ambulatory Visit: Payer: Medicare HMO | Admitting: Oncology

## 2020-06-08 ENCOUNTER — Ambulatory Visit: Payer: Medicare HMO

## 2020-06-15 ENCOUNTER — Encounter: Payer: Self-pay | Admitting: Emergency Medicine

## 2020-06-15 ENCOUNTER — Emergency Department: Payer: Medicare HMO

## 2020-06-15 ENCOUNTER — Emergency Department
Admission: EM | Admit: 2020-06-15 | Discharge: 2020-06-15 | Disposition: A | Discharge status: Home / Self Care | Payer: Medicare HMO | Attending: Emergency Medicine | Admitting: Emergency Medicine

## 2020-06-15 ENCOUNTER — Other Ambulatory Visit: Payer: Self-pay

## 2020-06-15 DIAGNOSIS — Z951 Presence of aortocoronary bypass graft: Secondary | ICD-10-CM | POA: Diagnosis not present

## 2020-06-15 DIAGNOSIS — E039 Hypothyroidism, unspecified: Secondary | ICD-10-CM | POA: Diagnosis not present

## 2020-06-15 DIAGNOSIS — I130 Hypertensive heart and chronic kidney disease with heart failure and stage 1 through stage 4 chronic kidney disease, or unspecified chronic kidney disease: Secondary | ICD-10-CM | POA: Insufficient documentation

## 2020-06-15 DIAGNOSIS — N1831 Chronic kidney disease, stage 3a: Secondary | ICD-10-CM | POA: Insufficient documentation

## 2020-06-15 DIAGNOSIS — Z794 Long term (current) use of insulin: Secondary | ICD-10-CM | POA: Diagnosis not present

## 2020-06-15 DIAGNOSIS — J45909 Unspecified asthma, uncomplicated: Secondary | ICD-10-CM | POA: Insufficient documentation

## 2020-06-15 DIAGNOSIS — U071 COVID-19: Secondary | ICD-10-CM | POA: Insufficient documentation

## 2020-06-15 DIAGNOSIS — Z79899 Other long term (current) drug therapy: Secondary | ICD-10-CM | POA: Insufficient documentation

## 2020-06-15 DIAGNOSIS — Z7951 Long term (current) use of inhaled steroids: Secondary | ICD-10-CM | POA: Diagnosis not present

## 2020-06-15 DIAGNOSIS — I25110 Atherosclerotic heart disease of native coronary artery with unstable angina pectoris: Secondary | ICD-10-CM | POA: Insufficient documentation

## 2020-06-15 DIAGNOSIS — J441 Chronic obstructive pulmonary disease with (acute) exacerbation: Secondary | ICD-10-CM | POA: Diagnosis not present

## 2020-06-15 DIAGNOSIS — E1122 Type 2 diabetes mellitus with diabetic chronic kidney disease: Secondary | ICD-10-CM | POA: Diagnosis not present

## 2020-06-15 DIAGNOSIS — Z87891 Personal history of nicotine dependence: Secondary | ICD-10-CM | POA: Diagnosis not present

## 2020-06-15 DIAGNOSIS — Z7984 Long term (current) use of oral hypoglycemic drugs: Secondary | ICD-10-CM | POA: Insufficient documentation

## 2020-06-15 DIAGNOSIS — Z91040 Latex allergy status: Secondary | ICD-10-CM | POA: Diagnosis not present

## 2020-06-15 DIAGNOSIS — R072 Precordial pain: Secondary | ICD-10-CM | POA: Diagnosis present

## 2020-06-15 DIAGNOSIS — R778 Other specified abnormalities of plasma proteins: Secondary | ICD-10-CM

## 2020-06-15 DIAGNOSIS — R7989 Other specified abnormal findings of blood chemistry: Secondary | ICD-10-CM | POA: Insufficient documentation

## 2020-06-15 DIAGNOSIS — I5031 Acute diastolic (congestive) heart failure: Secondary | ICD-10-CM | POA: Insufficient documentation

## 2020-06-15 DIAGNOSIS — R079 Chest pain, unspecified: Secondary | ICD-10-CM

## 2020-06-15 LAB — CBC WITH DIFFERENTIAL/PLATELET
Abs Immature Granulocytes: 0.17 10*3/uL — ABNORMAL HIGH (ref 0.00–0.07)
Basophils Absolute: 0.1 10*3/uL (ref 0.0–0.1)
Basophils Relative: 0 %
Eosinophils Absolute: 0.3 10*3/uL (ref 0.0–0.5)
Eosinophils Relative: 2 %
HCT: 34 % — ABNORMAL LOW (ref 36.0–46.0)
Hemoglobin: 11 g/dL — ABNORMAL LOW (ref 12.0–15.0)
Immature Granulocytes: 2 %
Lymphocytes Relative: 17 %
Lymphs Abs: 1.9 10*3/uL (ref 0.7–4.0)
MCH: 28.1 pg (ref 26.0–34.0)
MCHC: 32.4 g/dL (ref 30.0–36.0)
MCV: 86.7 fL (ref 80.0–100.0)
Monocytes Absolute: 0.7 10*3/uL (ref 0.1–1.0)
Monocytes Relative: 6 %
Neutro Abs: 8.2 10*3/uL — ABNORMAL HIGH (ref 1.7–7.7)
Neutrophils Relative %: 73 %
Platelets: 184 10*3/uL (ref 150–400)
RBC: 3.92 MIL/uL (ref 3.87–5.11)
RDW: 13.6 % (ref 11.5–15.5)
WBC: 11.3 10*3/uL — ABNORMAL HIGH (ref 4.0–10.5)
nRBC: 0 % (ref 0.0–0.2)

## 2020-06-15 LAB — COMPREHENSIVE METABOLIC PANEL
ALT: 15 U/L (ref 0–44)
AST: 18 U/L (ref 15–41)
Albumin: 2.9 g/dL — ABNORMAL LOW (ref 3.5–5.0)
Alkaline Phosphatase: 94 U/L (ref 38–126)
Anion gap: 12 (ref 5–15)
BUN: 14 mg/dL (ref 8–23)
CO2: 24 mmol/L (ref 22–32)
Calcium: 8.6 mg/dL — ABNORMAL LOW (ref 8.9–10.3)
Chloride: 100 mmol/L (ref 98–111)
Creatinine, Ser: 1.02 mg/dL — ABNORMAL HIGH (ref 0.44–1.00)
GFR, Estimated: 57 mL/min — ABNORMAL LOW (ref >60)
Glucose, Bld: 227 mg/dL — ABNORMAL HIGH (ref 70–99)
Potassium: 3.6 mmol/L (ref 3.5–5.1)
Sodium: 136 mmol/L (ref 135–145)
Total Bilirubin: 0.5 mg/dL (ref 0.3–1.2)
Total Protein: 6.7 g/dL (ref 6.5–8.1)

## 2020-06-15 LAB — MAGNESIUM: Magnesium: 1.5 mg/dL — ABNORMAL LOW (ref 1.7–2.4)

## 2020-06-15 LAB — BRAIN NATRIURETIC PEPTIDE: B Natriuretic Peptide: 141.8 pg/mL — ABNORMAL HIGH (ref 0.0–100.0)

## 2020-06-15 LAB — CBG MONITORING, ED: Glucose-Capillary: 109 mg/dL — ABNORMAL HIGH (ref 70–99)

## 2020-06-15 LAB — TROPONIN I (HIGH SENSITIVITY)
Troponin I (High Sensitivity): 22 ng/L — ABNORMAL HIGH (ref <18)
Troponin I (High Sensitivity): 34 ng/L — ABNORMAL HIGH (ref <18)
Troponin I (High Sensitivity): 31 ng/L — ABNORMAL HIGH (ref <18)

## 2020-06-15 LAB — PROCALCITONIN: Procalcitonin: <0.1 ng/mL

## 2020-06-15 MED ORDER — INSULIN ASPART 100 UNIT/ML ~~LOC~~ SOLN: 0.0000 [IU] | SUBCUTANEOUS | Status: DC

## 2020-06-15 MED ORDER — MAGNESIUM SULFATE 2 GM/50ML IV SOLN
2.0000 g | Freq: Once | INTRAVENOUS | Status: AC
  Administered 2020-06-15: 2 g via INTRAVENOUS
  Filled 2020-06-15: qty 50

## 2020-06-15 MED ORDER — ASPIRIN 81 MG PO CHEW
324.0000 mg | CHEWABLE_TABLET | Freq: Once | ORAL | Status: AC
  Administered 2020-06-15: 324 mg via ORAL
  Filled 2020-06-15: qty 4

## 2020-06-15 MED ORDER — IOHEXOL 350 MG/ML SOLN
75.0000 mL | Freq: Once | INTRAVENOUS | Status: AC | PRN
  Administered 2020-06-15: 75 mL via INTRAVENOUS

## 2020-06-15 NOTE — ED Notes (Signed)
Patient provided with orange juice, patient reports she has shaking and nausea when blood sugar drops below 100, patient A&OX3. Reports chest pain worsens with coughing.

## 2020-06-15 NOTE — ED Provider Notes (Signed)
[facility] Emergency Department Provider Note  ____________________________________________   Event Date/Time   First MD Initiated Contact with Patient 06/15/20 [phone]     (approximate)  I have reviewed the triage vital signs and the nursing notes.   HISTORY  Chief Complaint No chief complaint on file.   HPI [name] is a 75 y.o. female with a past medical history of CAD status post CABG, COPD, CHF, DM, HTN, HDL, OSA, PAD, thyroid disease, asthma, anemia, and aortic valve stenosis status post replacement with a prosthetic valve in 2015 who presents from home for assessment of cute onset of left-sided substernal chest pain and tightness in the setting of recent COVID-19 infection on 1/1.  Patient states she has had persistent cough and shortness of breath since then that do not seem particularly different today but the chest pain is new this morning.  She endorses some diarrhea over the last couple days that has been nonbloody.  No recent fevers, chills, headache, earache, sore throat, vomiting, abdominal pain, back pain or focal extremity pain or rash.  No recent falls or injuries.  Patient endorses remote tobacco abuse but none recently.         Past Medical History:  Diagnosis Date  . 1st degree AV block   . ACE-inhibitor cough   . Allergic rhinitis   . Anemia    iron deficiency anemia  . Anxiety   . Aortic ectasia (HCC)    a. CT abd in 12/2016 incidentally noted aortic atherosclerosis and infrarenal abdominal aortic ectasia measuring as large as 2.7 cm with recommendation to repeat US in 2023.  Marland Kitchen Arthritis   . Asthma   . Cataract   . Chronic depression   . Chronic diastolic CHF (congestive heart failure) ([place])   . Chronic headache   . COPD (chronic obstructive pulmonary disease) ([place])   . Coronary artery disease    a. DES to RCA and mid Cx 2009. b. CABG and bioprosthetic AVR May 2015. c. cutting balloon to prox Cx in 05/2016  . Diabetes  mellitus    type 2  . Diverticulitis of colon   . Essential hypertension   . GERD (gastroesophageal reflux disease)   . Hearing loss   . History of blood transfusion 2013  . History of prosthetic aortic valve replacement   . HOH (hard of hearing)   . Hypercholesterolemia    intolerance of statins and niaspan  . IDA (iron deficiency anemia) 02/03/2019  . Mobitz type 1 second degree AV block   . OSA (obstructive sleep apnea)    mild, intolerant of cpap  . PAD (peripheral artery disease) ([place])    a. atherosclerosis by CT abd 12/2016 in [place].  Marland Kitchen PONV (postoperative nausea and vomiting)   . Statin intolerance   . Thyroid disease     Patient Active Problem List   Diagnosis Date Noted  . Anemia due to stage 3a chronic kidney disease ([place]) 05/21/2019  . IDA (iron deficiency anemia) 02/03/2019  . COPD exacerbation ([place]) 12/28/2017  . Depression, major, single episode, complete remission ([place]) 04/11/2017  . 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 Aortic ectasia, abdominal ([place]) 01/01/2017  . GAD (generalized anxiety disorder) 09/26/2016  . Acute posthemorrhagic anemia 08/24/2016  . Acute respiratory failure with hypoxia ([place]) 08/24/2016  . Acute diastolic CHF (congestive heart failure) ([place]) 08/24/2016  . Hyponatremia 08/24/2016  . Leukocytosis 08/24/2016  . Fever 08/24/2016  . Diarrhea 08/24/2016  . Generalized weakness 08/24/2016  .  Acute diverticulitis 08/16/2016  . Gastrointestinal hemorrhage   . Ataxia 08/09/2016  . Dizziness 08/09/2016  . Staggering gait 08/09/2016  . NSTEMI (non-ST elevated myocardial infarction) ([place])   . AVB (atrioventricular block)   . Atypical chest pain   . Unstable angina ([place]) 05/25/2016  . Palpitations 05/25/2016  . PUD - recent Rx for H.Pylori 05/25/2016  . Stenosis of coronary artery stent   . Recurrent major depressive disorder, in partial remission ([place]) 07/29/2015  . Sialadenitis 04/25/2015  . Viral URI 03/24/2015  . Hypoglycemia  08/25/2014  . Nausea without vomiting 08/25/2014  . Dyslipidemia-statin ontol   . Long-term insulin use ([place]) 03/31/2014  . Aortic valve disorder 11/11/2013  . 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 Coronary Artery Disease s/p CABG 10/2013 10/10/2013  . Other malaise and fatigue 09/11/2013  . Alopecia 03/06/2013  . Insomnia 02/06/2013  . Gastroesophageal reflux disease without esophagitis 01/19/2013  . Dysphagia, unspecified(787.20) 01/19/2013  . Dyspnea 11/17/2012  . Weakness 11/17/2012  . Abnormal MRI of head 09/05/2012  . Depression with anxiety 03/21/2012  . Extrinsic asthma 11/12/2011  . Anemia 06/07/2011  . Chronic obstructive pulmonary disease ([place]) 01/25/2010  . Acquired hypothyroidism 08/10/2009  . MICROSCOPIC HEMATURIA 05/07/2009  . GOITER 03/02/2009  . Essential hypertension 01/06/2009  . MEMORY LOSS 06/23/2008  . Obstructive sleep apnea 12/25/2007  . Insulin dependent diabetes mellitus ([place]) 09/09/2006  . ALLERGIC RHINITIS 09/09/2006  . DIVERTICULOSIS, COLON 09/09/2006    Past Surgical History:  Procedure Laterality Date  . ABDOMINAL HYSTERECTOMY    . ABDOMINAL HYSTERECTOMY W/ PARTIAL VAGINACTOMY    . AORTIC VALVE REPLACEMENT N/A 10/12/2013   Procedure: AORTIC VALVE REPLACEMENT (AVR);  Surgeon: [name], MD;  Location: [place];  Service: Open Heart Surgery;  Laterality: N/A;  . [place]  . BARTHOLIN GLAND CYST EXCISION    . BLADDER SUSPENSION    . BREAST BIOPSY Bilateral 09/11/2000   neg  . BREAST BIOPSY Left 07/24/2010   neg  . BREAST CYST EXCISION  1988   bilateral nonmalignant tumors, x3  . CARDIAC CATHETERIZATION    . CARDIAC CATHETERIZATION N/A 05/25/2016   Procedure: Coronary Balloon Angioplasty;  Surgeon: Leonie Man, MD;  Location: [place] CV [facility];  Service: Cardiovascular;  Laterality: N/A;  . CARDIAC CATHETERIZATION N/A 05/25/2016   Procedure: Coronary/Graft Angiography;  Surgeon: Leonie Man, MD;  Location: [place] CV LAB;   Service: Cardiovascular;  Laterality: N/A;  . CATARACT EXTRACTION W/ INTRAOCULAR LENS  IMPLANT, BILATERAL    . CHOLECYSTECTOMY  2001  . COLECTOMY     lap sigmoid  . COLONOSCOPY  2014   polyps found, 2 clamped off.  . CORONARY ANGIOPLASTY  10/29/2007   Prox RCA & Mid Cx.  . CORONARY ARTERY BYPASS GRAFT N/A 10/12/2013   Procedure: CORONARY ARTERY BYPASS GRAFT TIMES TWO;  Surgeon: [name], MD;  Location: [place];  Service: Open Heart Surgery;  Laterality: N/A;  . CORONARY/GRAFT ANGIOGRAPHY N/A 09/20/2017   Procedure: CORONARY/GRAFT ANGIOGRAPHY;  Surgeon: Sherren Mocha, MD;  Location: [place] CV LAB;  Service: Cardiovascular;  Laterality: N/A;  . LEFT HEART CATHETERIZATION WITH CORONARY ANGIOGRAM N/A 10/09/2013   Procedure: LEFT HEART CATHETERIZATION WITH CORONARY ANGIOGRAM;  Surgeon: [name], MD;  Location: [facility] CATH LAB;  Service: Cardiovascular;  Laterality: N/A;  . STERNAL WIRES REMOVAL N/A 04/13/2014   Procedure: STERNAL WIRES REMOVAL;  Surgeon: [name], MD;  Location: [place];  Service: Thoracic;  Laterality: N/A;  .  THYROIDECTOMY    . TONSILLECTOMY    . TUBAL LIGATION    . VAGINAL DELIVERY     3  . VISCERAL ARTERY INTERVENTION N/A 08/16/2016   Procedure: Visceral Artery Intervention;  Surgeon: Algernon Huxley, MD;  Location: [place] [facility];  Service: Cardiovascular;  Laterality: N/A;    Prior to Admission medications   Medication Sig Start Date End Date Taking? Authorizing Provider  ARIPiprazole (ABILIFY) 2 MG tablet Take 2 mg by mouth daily.    [provider]  azithromycin (ZITHROMAX Z-PAK) 250 MG tablet Take 2 tablets (500 mg) on  Day 1,  followed by 1 tablet (250 mg) once daily on Days 2 through 5. 06/04/20   Carrie Mew, MD  budesonide (PULMICORT) 0.5 MG/2ML nebulizer solution Take 0.5 mg by nebulization daily.     [provider]  Calcium-Vitamin D (CALTRATE 600 PLUS-VIT D PO) Take 1 tablet by mouth 2 (two) times daily.      [provider]  citalopram (CELEXA) 40 MG tablet Take 40 mg by mouth every morning. 03/30/19   [provider]  DULoxetine (CYMBALTA) 20 MG capsule TAKE 1 CAPSULE BY MOUTH ONCE DAILY FOR 7 DAYS AND THEN INCREASE TO 2 CAPSULES DAILY WITH BREAKFAST 05/09/19   [provider]  FLUoxetine (PROZAC) 40 MG capsule Take by mouth. 06/17/19 06/16/20  [provider]  furosemide (LASIX) 20 MG tablet Take 20 mg by mouth daily as needed for fluid or edema.     [provider]  insulin NPH-regular Human (NOVOLIN 70/30) (70-30) 100 UNIT/ML injection Inject 70 Units into the skin 2 (two) times daily with a meal. Inject 70u under the skin every morning at breakfast and inject 70u under the skin every evening with supper    [provider]  levothyroxine (SYNTHROID, LEVOTHROID) 125 MCG tablet Take 125 mcg by mouth daily.  04/03/17   [provider]  metFORMIN (GLUCOPHAGE) 1000 MG tablet Take 1,000 mg by mouth 2 (two) times daily with a meal.    [provider]  nitroGLYCERIN (NITROSTAT) 0.4 MG SL tablet Place 1 tablet (0.4 mg total) under the tongue every 5 (five) minutes as needed for chest pain. Please make overdue appt with Dr. Angelena Form before anymore refills. 1st attempt 09/30/19   [name], MD  ondansetron (ZOFRAN ODT) 4 MG disintegrating tablet Take 1 tablet (4 mg total) by mouth every 8 (eight) hours as needed for nausea or vomiting. 06/04/20   Carrie Mew, MD  pantoprazole (PROTONIX) 20 MG tablet Take 20 mg by mouth daily. 01/23/17   [provider]  predniSONE (DELTASONE) 20 MG tablet Take 2 tablets (40 mg total) by mouth daily. 06/04/20   Carrie Mew, MD  promethazine (PHENERGAN) 25 MG tablet Take 25 mg by mouth every 6 (six) hours as needed for nausea or vomiting. Patient not taking: Reported on 04/26/2020    [provider]  ranolazine (RANEXA) 500 MG 12 hr tablet Take 1 tablet (500 mg total) by mouth  2 (two) times daily. 03/05/18   Burtis Junes, NP  traZODone (DESYREL) 100 MG tablet Take 100 mg by mouth at bedtime.     [provider]    Allergies Amitriptyline, Benadryl [diphenhydramine], Demerol [meperidine], Gabapentin, Loratadine, Meperidine hcl, Mirtazapine, Olanzapine, Voltaren [diclofenac sodium], Zetia [ezetimibe], Ativan [lorazepam], Atorvastatin, Budesonide-formoterol fumarate, Bupropion hcl, Caffeine, Codeine sulfate, Lisinopril, Metformin, Mometasone furoate, Morphine sulfate, Other, Oxycodone-acetaminophen, Pioglitazone, Propoxyphene n-acetaminophen, Rosuvastatin, Shellfish allergy, Suvorexant, Ticagrelor, Tramadol, Trazodone and nefazodone, Venlafaxine,  Zolpidem tartrate, and Latex  Family History  Problem Relation Age of Onset  . Breast cancer Mother 34  . Hypertension Father   . Mesothelioma Father   . Asthma Father   . Stroke Paternal Grandfather   . Heart disease Other   . Breast cancer Maternal Aunt   . Breast cancer Paternal Aunt     Social History Social History   Tobacco Use  . Smoking status: Former Smoker    Packs/day: 0.50    Years: 30.00    Pack years: 15.00    Types: Cigarettes    Quit date: 10/02/2013    Years since quitting: 6.7  . Smokeless tobacco: Never Used  Vaping Use  . Vaping Use: Never used  Substance Use Topics  . Alcohol use: No  . Drug use: No    Review of Systems  Review of Systems  Constitutional: Positive for malaise/fatigue. Negative for chills and fever.  HENT: Negative for sore throat.   Eyes: Negative for pain.  Respiratory: Positive for cough and shortness of breath. Negative for stridor.   Cardiovascular: Positive for chest pain.  Gastrointestinal: Positive for diarrhea. Negative for vomiting.  Genitourinary: Negative for dysuria.  Skin: Negative for rash.  Neurological: Negative for seizures, loss of consciousness and headaches.  Psychiatric/Behavioral: Negative for suicidal ideas.  All other systems  reviewed and are negative.     ____________________________________________   PHYSICAL EXAM:  VITAL SIGNS: ED Triage Vitals  Enc Vitals Group     BP      Pulse      Resp      Temp      Temp src      SpO2      Weight      Height      Head Circumference      Peak Flow      Pain Score      Pain Loc      Pain Edu?      Excl. in [place]?    Vitals:   06/15/20 1330 06/15/20 1430  BP: (!) 146/74 (!) 145/72  Pulse: 71 93  Resp: (!) 27 (!) 22  Temp:    SpO2: 97% 100%   Physical Exam Vitals and nursing note reviewed.  Constitutional:      General: She is not in acute distress.    Appearance: Normal appearance. She is well-developed, normal weight and well-nourished.  HENT:     Head: Normocephalic and atraumatic.     Right Ear: External ear normal.     Left Ear: External ear normal.     Nose: Nose normal.  Eyes:     Conjunctiva/sclera: Conjunctivae normal.  Cardiovascular:     Rate and Rhythm: Normal rate and regular rhythm.     Pulses: Normal pulses.     Heart sounds: No murmur heard.   Pulmonary:     Effort: Pulmonary effort is normal. Tachypnea present. No respiratory distress.     Breath sounds: Normal breath sounds.  Abdominal:     Palpations: Abdomen is soft.     Tenderness: There is no abdominal tenderness.  Musculoskeletal:        General: No edema.     Cervical back: Neck supple.     Right lower leg: No edema.     Left lower leg: No edema.  Skin:    General: Skin is warm and dry.     Capillary Refill: Capillary refill takes less than 2 seconds.  Neurological:  Mental Status: She is alert and oriented to person, place, and time.  Psychiatric:        Mood and Affect: Mood and affect and mood normal.      ____________________________________________   LABS (all labs ordered are listed, but only abnormal results are displayed)  Labs Reviewed  CBC WITH DIFFERENTIAL/PLATELET - Abnormal; Notable for the following components:      Result Value    WBC 11.3 (*)    Hemoglobin 11.0 (*)    HCT 34.0 (*)    Neutro Abs 8.2 (*)    Abs Immature Granulocytes 0.17 (*)    All other components within normal limits  COMPREHENSIVE METABOLIC PANEL - Abnormal; Notable for the following components:   Glucose, Bld 227 (*)    Creatinine, Ser 1.02 (*)    Calcium 8.6 (*)    Albumin 2.9 (*)    GFR, Estimated 57 (*)    All other components within normal limits  MAGNESIUM - Abnormal; Notable for the following components:   Magnesium 1.5 (*)    All other components within normal limits  BRAIN NATRIURETIC PEPTIDE - Abnormal; Notable for the following components:   B Natriuretic Peptide 141.8 (*)    All other components within normal limits  CBG MONITORING, ED - Abnormal; Notable for the following components:   Glucose-Capillary 109 (*)    All other components within normal limits  TROPONIN I (HIGH SENSITIVITY) - Abnormal; Notable for the following components:   Troponin I (High Sensitivity) 22 (*)    All other components within normal limits  TROPONIN I (HIGH SENSITIVITY) - Abnormal; Notable for the following components:   Troponin I (High Sensitivity) 34 (*)    All other components within normal limits  TROPONIN I (HIGH SENSITIVITY) - Abnormal; Notable for the following components:   Troponin I (High Sensitivity) 31 (*)    All other components within normal limits  PROCALCITONIN  HEMOGLOBIN A1C  TROPONIN I (HIGH SENSITIVITY)   ____________________________________________  EKG  Sinus rhythm with a ventricular rate of 90, normal axis, unremarkable intervals, some nonspecific T wave changes in aVL and respiratory motion versus artifact in leads I, II, and III without other clear evidence of acute ischemia.  No significant underlying arrhythmia.  ____________________________________________  RADIOLOGY  ED MD interpretation: Chest x-ray has bilateral opacities but no evidence of pneumothorax or other acute intrathoracic process.  CTA chest shows  no PE but bilateral opacities consistent with COVID-19 pneumonia.  Official radiology report(s): DG Chest 1 View  Result Date: 06/15/2020 CLINICAL DATA:  Shortness of breath.  Dizziness. EXAM: CHEST  1 VIEW COMPARISON:  06/04/2020 . FINDINGS: Previous median sternotomy and CABG procedure. Stable cardiomediastinal contours. Diffusely increased interstitial markings are identified bilaterally and appear increased from previous exam. IMPRESSION: Increase in bilateral interstitial opacities compatible with pulmonary edema versus atypical infection. Electronically Signed   By: Kerby Moors M.D.   On: 06/15/2020 10:09   CT Angio Chest PE W and/or Wo Contrast  Result Date: 06/15/2020 CLINICAL DATA:  Shortness of breath and chest pain. COVID-19 positive the EXAM: CT ANGIOGRAPHY CHEST WITH CONTRAST TECHNIQUE: Multidetector CT imaging of the chest was performed using the standard protocol during bolus administration of intravenous contrast. Multiplanar CT image reconstructions and MIPs were obtained to evaluate the vascular anatomy. CONTRAST:  92mL OMNIPAQUE IOHEXOL 350 MG/ML SOLN COMPARISON:  Chest CT March 25, 2020; chest radiograph June 15, 2020. FINDINGS: Cardiovascular: There is no demonstrable pulmonary embolus. There is no thoracic aortic aneurysm or  dissection. There are scattered foci of calcification in visualized great vessels. There are foci of aortic atherosclerosis as well as foci of coronary artery calcification. Patient is status post coronary artery bypass grafting. No pericardial effusion or pericardial thickening. There is left ventricular hypertrophy. There is an aortic valve replacement. Mediastinum/Nodes: Thyroid absent. There is a lymph node to the right of the upper carina measuring 1.7 x 1.4 cm, slightly larger than on previous study. Scattered subcentimeter mediastinal lymph nodes noted elsewhere. There is a small hiatal hernia. Lungs/Pleura: There is multifocal airspace opacity  throughout the lungs without consolidation. There is ill-defined airspace opacity obscuring an area localized thickening in the right upper lobe described previously. There is underlying interstitial thickening with questionable mild interstitial edema superimposed. No pleural effusions are evident. Upper Abdomen: There is upper abdominal aortic atherosclerosis. There is atherosclerotic calcification in visualized major mesenteric arterial vessels. Gallbladder is absent. Musculoskeletal: Status post median sternotomy. No blastic or lytic bone lesions. No chest wall lesions. Review of the MIP images confirms the above findings. IMPRESSION: 1. No demonstrable pulmonary embolus. No thoracic aortic aneurysm or dissection. There is aortic atherosclerosis. There are foci of great vessel and native coronary artery calcification. Patient is status post coronary artery bypass grafting. Patient is status post aortic valve replacement. There is left ventricular hypertrophy. 2. Multifocal airspace opacity consistent with atypical organism pneumonia. No consolidation or pleural effusion. An area of localized nodular opacity in the right upper lobe noted previously is partially obscured by infiltrate. This area does not appear grossly changed. 3. Enlarged right pericarinal lymph node which may well have reactive etiology given the parenchymal lung changes. 4.  Small hiatal hernia. 5.  Absent thyroid and absent gallbladder. Aortic Atherosclerosis (ICD10-I70.0). Electronically Signed   By: Lowella Grip III M.D.   On: 06/15/2020 11:08    ____________________________________________   PROCEDURES  Procedure(s) performed (including Critical Care):  .1-3 Lead EKG Interpretation Performed by: Lucrezia Starch, MD Authorized by: Lucrezia Starch, MD     Interpretation: normal     ECG rate assessment: normal     Rhythm: sinus rhythm     Ectopy: none     Conduction: normal        ____________________________________________   INITIAL IMPRESSION / ASSESSMENT AND PLAN / ED COURSE      Patient presents with a past history exam for assessment of acute left-sided and substernal chest pressure and tightness that began this morning in the setting of cough and congestion over the last 2 weeks after recent diagnosis of COVID-19.  Patient does have a history of CABG.  She is tachypneic at 25 otherwise stable vital signs on arrival.  Primary differential includes but is not limited to PE, persistent COVID-19 pneumonia, bacterial pneumonia, symptomatic anemia, arrhythmia, ACS, myocarditis versus pericarditis, pneumothorax, CHF exacerbation, and GERD.  ECG has some artifact and nonspecific findings in inferior lead but no clear evidence otherwise of acute ischemia.  Troponin did trend from 22 to 34 back down to 31.  CTA obtained shows no evidence of PE dissection or aneurysm but does show evidence of atherosclerosis.  Patient also has multifocal pneumonia consistent with persistent COVID.  No significant volume overload large effusion or pneumothorax.  CMP remarkable for mild hyperglycemia with a glucose of 227 with no other significant electrolyte or metabolic derangements.  Procalcitonin is undetectable.  Magnesium is 1.5.  This was repleted.  BMP is unremarkable and overall no historical exam findings are imaging findings suggest acute heart failure.  On my reassessment patient stated she was chest pain-free.  It is possible she is experiencing some mild pericarditis versus slight demand ischemia in the setting of her persistent COVID-19 pneumonia.  No evidence of respiratory failure and given downtrending troponin and a low suspicion for ACS or any significant myocarditis.  Given reassuring vital signs with otherwise reassuring exam and improving troponin as well as no evidence of PE patient requesting to go home I believe this is reasonable with plan for close outpatient  follow-up with her cardiologist.  Discharged stable condition.  Strict return precautions advised and discussed.   ____________________________________________   FINAL CLINICAL IMPRESSION(S) / ED DIAGNOSES  Final diagnoses:  COVID-19  Troponin I above reference range  Chest pain, unspecified type  Hypomagnesemia    Medications  insulin aspart (novoLOG) injection 0-15 Units (0 Units Subcutaneous Not Given 06/15/20 1128)  aspirin chewable tablet 324 mg (324 mg Oral Given 06/15/20 1012)  iohexol (OMNIPAQUE) 350 MG/ML injection 75 mL (75 mLs Intravenous Contrast Given 06/15/20 1051)  magnesium sulfate IVPB 2 g 50 mL (0 g Intravenous Stopped 06/15/20 1233)     ED Discharge Orders    None       Note:  This document was prepared using Dragon voice recognition software and may include unintentional dictation errors.   Lucrezia Starch, MD 06/15/20 [phone]

## 2020-06-15 NOTE — ED Notes (Signed)
Patient ambulated to and from toilet, approx 4-5 steps, she had labored breathing, RR 30, oxygen 93% on RA. After sitting for a few min, vitals are back to baseline.

## 2020-06-15 NOTE — ED Triage Notes (Signed)
Pt comes into the ED via POV c/o left chest pain that radiates into the left shoulder.  Pt presents SHOB and dizziness, but denies any nausea.  Pt took nitro at home with no relief.  Pt recently diagnosed with COVID.  Pt has h/o MI and open heart surgery.

## 2020-06-15 NOTE — ED Notes (Signed)
Report given to Robin RN

## 2020-06-15 NOTE — ED Notes (Signed)
Patient given warm blanket for comfort.

## 2020-06-20 ENCOUNTER — Inpatient Hospital Stay: Payer: Medicare HMO

## 2020-06-21 ENCOUNTER — Telehealth: Payer: Self-pay

## 2020-06-21 NOTE — Telephone Encounter (Signed)
Patient is scheduled for MD/Venofer tomorrow 06/22/20.  She did not come for lab draw on 06/20/20.  Please call patient to reschedule the lab with lab/poss Venofer 2 days later.

## 2020-06-22 ENCOUNTER — Inpatient Hospital Stay: Payer: Medicare HMO

## 2020-06-22 ENCOUNTER — Inpatient Hospital Stay: Payer: Medicare HMO | Admitting: Oncology

## 2020-06-22 NOTE — Telephone Encounter (Signed)
FYI...   Per pt request to cx all appts and stated the she would contact the office at a later date to have appts R/S.[place]land KitchenMarland KitchenSummit Station

## 2020-06-22 NOTE — Telephone Encounter (Signed)
This patent is still on the schedule for today.  Was she contacted to get her appts r/s?

## 2020-07-18 ENCOUNTER — Inpatient Hospital Stay: Payer: Medicare HMO | Attending: Oncology

## 2020-07-18 DIAGNOSIS — E079 Disorder of thyroid, unspecified: Secondary | ICD-10-CM | POA: Insufficient documentation

## 2020-07-18 DIAGNOSIS — N1831 Chronic kidney disease, stage 3a: Secondary | ICD-10-CM | POA: Diagnosis present

## 2020-07-18 DIAGNOSIS — D500 Iron deficiency anemia secondary to blood loss (chronic): Secondary | ICD-10-CM

## 2020-07-18 DIAGNOSIS — D631 Anemia in chronic kidney disease: Secondary | ICD-10-CM | POA: Diagnosis present

## 2020-07-18 LAB — CBC WITH DIFFERENTIAL/PLATELET
Abs Immature Granulocytes: 0.06 10*3/uL (ref 0.00–0.07)
Basophils Absolute: 0.1 10*3/uL (ref 0.0–0.1)
Basophils Relative: 1 %
Eosinophils Absolute: 0.4 10*3/uL (ref 0.0–0.5)
Eosinophils Relative: 4 %
HCT: 35.7 % — ABNORMAL LOW (ref 36.0–46.0)
Hemoglobin: 11.6 g/dL — ABNORMAL LOW (ref 12.0–15.0)
Immature Granulocytes: 1 %
Lymphocytes Relative: 23 %
Lymphs Abs: 2.2 10*3/uL (ref 0.7–4.0)
MCH: 27.8 pg (ref 26.0–34.0)
MCHC: 32.5 g/dL (ref 30.0–36.0)
MCV: 85.4 fL (ref 80.0–100.0)
Monocytes Absolute: 0.6 10*3/uL (ref 0.1–1.0)
Monocytes Relative: 7 %
Neutro Abs: 6.3 10*3/uL (ref 1.7–7.7)
Neutrophils Relative %: 64 %
Platelets: 162 10*3/uL (ref 150–400)
RBC: 4.18 MIL/uL (ref 3.87–5.11)
RDW: 15.1 % (ref 11.5–15.5)
WBC: 9.7 10*3/uL (ref 4.0–10.5)
nRBC: 0 % (ref 0.0–0.2)

## 2020-07-18 LAB — IRON AND TIBC
Iron: 34 ug/dL (ref 28–170)
Saturation Ratios: 11 % (ref 10.4–31.8)
TIBC: 316 ug/dL (ref 250–450)
UIBC: 282 ug/dL

## 2020-07-18 LAB — TSH: TSH: 3.648 u[IU]/mL (ref 0.350–4.500)

## 2020-07-18 LAB — FERRITIN: Ferritin: 150 ng/mL (ref 11–307)

## 2020-07-19 ENCOUNTER — Inpatient Hospital Stay: Payer: Medicare HMO

## 2020-07-19 ENCOUNTER — Encounter: Payer: Self-pay | Admitting: Oncology

## 2020-07-19 ENCOUNTER — Inpatient Hospital Stay (HOSPITAL_BASED_OUTPATIENT_CLINIC_OR_DEPARTMENT_OTHER): Payer: Medicare HMO | Admitting: Oncology

## 2020-07-19 VITALS — BP 127/66 | HR 84 | Temp 99.3°F | Wt 194.0 lb

## 2020-07-19 VITALS — BP 109/69 | HR 82 | Temp 98.7°F | Resp 18

## 2020-07-19 DIAGNOSIS — D500 Iron deficiency anemia secondary to blood loss (chronic): Secondary | ICD-10-CM | POA: Diagnosis not present

## 2020-07-19 DIAGNOSIS — N1831 Chronic kidney disease, stage 3a: Secondary | ICD-10-CM | POA: Diagnosis not present

## 2020-07-19 DIAGNOSIS — D631 Anemia in chronic kidney disease: Secondary | ICD-10-CM | POA: Diagnosis not present

## 2020-07-19 MED ORDER — SODIUM CHLORIDE 0.9 % IV SOLN
Freq: Once | INTRAVENOUS | Status: AC
  Filled 2020-07-19: qty 250

## 2020-07-19 MED ORDER — IRON SUCROSE 20 MG/ML IV SOLN
200.0000 mg | Freq: Once | INTRAVENOUS | Status: AC
  Administered 2020-07-19: 200 mg via INTRAVENOUS
  Filled 2020-07-19: qty 10

## 2020-07-19 NOTE — Progress Notes (Signed)
Hematology/Oncology follow up note [facility] [phone] [phone]   Patient Care Team: [place] as PCP - General Angelena Form, Annita Brod, MD as PCP - Cardiology (Cardiology) Elsie Stain, MD as Attending Physician (Pulmonary Disease) [name], MD as Consulting Physician (Cardiology) Gabriel Carina Betsey Holiday, MD as Physician Assistant (Internal Medicine) [name], MD as Consulting Physician (Cardiothoracic Surgery) Earlie Server, MD as Consulting Physician (Hematology and Oncology)  REFERRING PROVIDER: [place] CHIEF COMPLAINTS/REASON FOR VISIT:  Follow up  of iron deficiency anemia  HISTORY OF PRESENTING ILLNESS:  [name] is a  75 y.o.  female with PMH listed below was seen in consultation at the request of [place]   for evaluation of iron deficiency anemia.   Reviewed patient's recent labs  01/17/2019 labs revealed anemia with hemoglobin of 8.5, MCV 75.3. Reviewed patient's previous labs ordered by primary care physician's office, anemia is chronic onset , duration is since 2009.  Patient presented to emergency room on 01/17/2019 for evaluation of fatigue, patient is currently on a 10-day course of doxycycline prescribed by emergency room. She was also recently admitted between 01/07/2019 - 01/08/2019 due to parotitis submandibular gland inflammation.  Received a Decadron and Unasyn.  During that admission, patient received IV Venofer treatments.   Associated signs and symptoms: Patient reports fatigue.  Associated with SOB with exertion.  Denies weight loss, easy bruising, hematochezia, hemoptysis, hematuria. Context:  History of iron deficiency: History of iron deficiency. Rectal bleeding: Denies any blood in the stool. Patient had a history of GI bleeding. Last endoscopy: 11/08/2012 colonoscopy a single solitary 12 mm ulcer was found in the descending colon.  A lot of blood suctioned  from colon.  Some bleeding was present.  Stigmata of recent bleeding was present.  Area was successfully injected with 6 mL of a 1-10,000 solution of epinephrine for drug delivery.  1 large hemostatic clip was successfully placed.  Single large mouth diverticulum was found in the sigmoid colon.  Area was successfully injected with 3 mils of 1-10,000 solution of epinephrine.  Benign polyps were removed. 01/28/2013 EGD by Dr. Jamal Collin showed erythematous mucosa in the antrum.  Esophageal.  Mucosal changes suspicious for Barrett esophagus.  Biopsy showed antral mucosa with moderate reactive gastropathy.  No intestinal metaplasia, dysplasia or malignancy.  No H. pylori.  GE junction biopsy showed stratified squamous epithelium without inflammation or reactive changes.  And a single tiny focus of columnar epithelium most compatible with an esophageal clot.  No interstitial metaplasia or dysplasia or malignancy.    INTERVAL HISTORY [name] is a 75 y.o. female who has above history reviewed by me today presents for follow up visit for management of iron deficiency anemia.  Problems and complaints are listed below: Patient reports feeling tired.  She has severe hearing impairment.  Denies any black or bloody bowel movement.     Review of Systems  Constitutional: Positive for fatigue. Negative for appetite change, chills and fever.  HENT:   Positive for hearing loss. Negative for voice change.   Eyes: Negative for eye problems.  Respiratory: Negative for chest tightness, cough and shortness of breath.   Cardiovascular: Negative for chest pain.  Gastrointestinal: Negative for abdominal distention, abdominal pain and blood in stool.  Endocrine: Negative for hot flashes.  Genitourinary: Negative for difficulty urinating and frequency.   Musculoskeletal: Negative for arthralgias.  Skin: Negative for itching and rash.  Neurological: Negative for extremity weakness.  Hematological: Negative for  adenopathy.  Psychiatric/Behavioral: Negative for confusion.    MEDICAL HISTORY:  Past Medical History:  Diagnosis Date  . 1st degree AV block   . ACE-inhibitor cough   . Allergic rhinitis   . Anemia    iron deficiency anemia  . Anxiety   . Aortic ectasia (HCC)    a. CT abd in 12/2016 incidentally noted aortic atherosclerosis and infrarenal abdominal aortic ectasia measuring as large as 2.7 cm with recommendation to repeat US in 2023.  Marland Kitchen Arthritis   . Asthma   . Cataract   . Chronic depression   . Chronic diastolic CHF (congestive heart failure) ([place])   . Chronic headache   . COPD (chronic obstructive pulmonary disease) ([place])   . Coronary artery disease    a. DES to RCA and mid Cx 2009. b. CABG and bioprosthetic AVR May 2015. c. cutting balloon to prox Cx in 05/2016  . Diabetes mellitus    type 2  . Diverticulitis of colon   . Essential hypertension   . GERD (gastroesophageal reflux disease)   . Hearing loss   . History of blood transfusion 2013  . History of prosthetic aortic valve replacement   . HOH (hard of hearing)   . Hypercholesterolemia    intolerance of statins and niaspan  . IDA (iron deficiency anemia) 02/03/2019  . Mobitz type 1 second degree AV block   . OSA (obstructive sleep apnea)    mild, intolerant of cpap  . PAD (peripheral artery disease) ([place])    a. atherosclerosis by CT abd 12/2016 in [place].  Marland Kitchen PONV (postoperative nausea and vomiting)   . Statin intolerance   . Thyroid disease     SURGICAL HISTORY: Past Surgical History:  Procedure Laterality Date  . ABDOMINAL HYSTERECTOMY    . ABDOMINAL HYSTERECTOMY W/ PARTIAL VAGINACTOMY    . AORTIC VALVE REPLACEMENT N/A 10/12/2013   Procedure: AORTIC VALVE REPLACEMENT (AVR);  Surgeon: [name], MD;  Location: [place];  Service: Open Heart Surgery;  Laterality: N/A;  . [place]  . BARTHOLIN GLAND CYST EXCISION    . BLADDER SUSPENSION    . BREAST BIOPSY Bilateral 09/11/2000   neg  . BREAST BIOPSY  Left 07/24/2010   neg  . BREAST CYST EXCISION  1988   bilateral nonmalignant tumors, x3  . CARDIAC CATHETERIZATION    . CARDIAC CATHETERIZATION N/A 05/25/2016   Procedure: Coronary Balloon Angioplasty;  Surgeon: Leonie Man, MD;  Location: [place] [facility];  Service: Cardiovascular;  Laterality: N/A;  . CARDIAC CATHETERIZATION N/A 05/25/2016   Procedure: Coronary/Graft Angiography;  Surgeon: Leonie Man, MD;  Location: [place] [facility];  Service: Cardiovascular;  Laterality: N/A;  . CATARACT EXTRACTION W/ INTRAOCULAR LENS  IMPLANT, BILATERAL    . CHOLECYSTECTOMY  2001  . COLECTOMY     lap sigmoid  . COLONOSCOPY  2014   polyps found, 2 clamped off.  . CORONARY ANGIOPLASTY  10/29/2007   Prox RCA & Mid Cx.  . CORONARY ARTERY BYPASS GRAFT N/A 10/12/2013   Procedure: CORONARY ARTERY BYPASS GRAFT TIMES TWO;  Surgeon: [name], MD;  Location: [place];  Service: Open Heart Surgery;  Laterality: N/A;  . CORONARY/GRAFT ANGIOGRAPHY N/A 09/20/2017   Procedure: CORONARY/GRAFT ANGIOGRAPHY;  Surgeon: Sherren Mocha, MD;  Location: [place] [facility];  Service: Cardiovascular;  Laterality: N/A;  . LEFT HEART CATHETERIZATION WITH CORONARY ANGIOGRAM N/A 10/09/2013   Procedure: LEFT HEART CATHETERIZATION WITH CORONARY ANGIOGRAM;  Surgeon: [name], MD;  Location: [facility];  Service: Cardiovascular;  Laterality: N/A;  . STERNAL WIRES REMOVAL N/A 04/13/2014   Procedure: STERNAL WIRES REMOVAL;  Surgeon: [name], MD;  Location: [facility];  Service: Thoracic;  Laterality: N/A;  . THYROIDECTOMY    . TONSILLECTOMY    . TUBAL LIGATION    . VAGINAL DELIVERY     3  . VISCERAL ARTERY INTERVENTION N/A 08/16/2016   Procedure: Visceral Artery Intervention;  Surgeon: Algernon Huxley, MD;  Location: [place] [facility];  Service: Cardiovascular;  Laterality: N/A;    SOCIAL HISTORY: Social History   Socioeconomic History  . Marital status: Married    Spouse name: Not on file  . Number  of children: 3  . Years of education: Not on file  . Highest education level: Not on file  Occupational History  . Occupation: Retired    Fish farm manager: UNEMPLOYED    Comment: CNA  Tobacco Use  . Smoking status: Former Smoker    Packs/day: 0.50    Years: 30.00    Pack years: 15.00    Types: Cigarettes    Quit date: 10/02/2013    Years since quitting: 6.8  . Smokeless tobacco: Never Used  Vaping Use  . Vaping Use: Never used  Substance and Sexual Activity  . Alcohol use: No  . Drug use: No  . Sexual activity: Not Currently  Other Topics Concern  . Not on file  Social History Narrative   Does not have Living Will   Desires CPR, would not want prolonged life support if futile.   Social Determinants of Health   Financial Resource Strain: Not on file  Food Insecurity: Not on file  Transportation Needs: Not on file  Physical Activity: Not on file  Stress: Not on file  Social Connections: Not on file  Intimate Partner Violence: Not on file    FAMILY HISTORY: Family History  Problem Relation Age of Onset  . Breast cancer Mother 13  . Hypertension Father   . Mesothelioma Father   . Asthma Father   . Stroke Paternal Grandfather   . Heart disease Other   . Breast cancer Maternal Aunt   . Breast cancer Paternal Aunt     ALLERGIES:  is allergic to amitriptyline, benadryl [diphenhydramine], demerol [meperidine], gabapentin, loratadine, meperidine hcl, mirtazapine, olanzapine, voltaren [diclofenac sodium], zetia [ezetimibe], ativan [lorazepam], atorvastatin, budesonide-formoterol fumarate, bupropion hcl, caffeine, codeine sulfate, lisinopril, metformin, mometasone furoate, morphine sulfate, other, oxycodone-acetaminophen, pioglitazone, propoxyphene n-acetaminophen, rosuvastatin, shellfish allergy, suvorexant, ticagrelor, tramadol, trazodone and nefazodone, venlafaxine, zolpidem tartrate, and latex.  MEDICATIONS:  Current Outpatient Medications  Medication Sig Dispense Refill  .  ARIPiprazole (ABILIFY) 2 MG tablet Take 2 mg by mouth daily.    . budesonide (PULMICORT) 0.5 MG/2ML nebulizer solution Take 0.5 mg by nebulization daily.    . Calcium-Vitamin D (CALTRATE 600 PLUS-VIT D PO) Take 1 tablet by mouth 2 (two) times daily.    . citalopram (CELEXA) 40 MG tablet Take 40 mg by mouth every morning.    . DULoxetine (CYMBALTA) 20 MG capsule TAKE 1 CAPSULE BY MOUTH ONCE DAILY FOR 7 DAYS AND THEN INCREASE TO 2 CAPSULES DAILY WITH BREAKFAST    . furosemide (LASIX) 20 MG tablet Take 20 mg by mouth daily as needed for fluid or edema.     . insulin NPH-regular Human (NOVOLIN 70/30) (70-30) 100 UNIT/ML injection Inject 70 Units into the skin 2 (two) times daily with a meal. Inject 70u  under the skin every morning at breakfast and inject 70u under the skin every evening with supper    . levothyroxine (SYNTHROID, LEVOTHROID) 125 MCG tablet Take 125 mcg by mouth daily.     . ondansetron (ZOFRAN ODT) 4 MG disintegrating tablet Take 1 tablet (4 mg total) by mouth every 8 (eight) hours as needed for nausea or vomiting. 20 tablet 0  . pantoprazole (PROTONIX) 20 MG tablet Take 20 mg by mouth daily.    . promethazine (PHENERGAN) 25 MG tablet Take 25 mg by mouth every 6 (six) hours as needed for nausea or vomiting.    . traZODone (DESYREL) 100 MG tablet Take 100 mg by mouth at bedtime.    Marland Kitchen albuterol (PROVENTIL) (2.5 MG/3ML) 0.083% nebulizer solution Take 2.5 mg by nebulization every 4 (four) hours as needed.    Marland Kitchen azithromycin (ZITHROMAX Z-PAK) 250 MG tablet Take 2 tablets (500 mg) on  Day 1,  followed by 1 tablet (250 mg) once daily on Days 2 through 5. (Patient not taking: Reported on 07/19/2020) 6 each 0  . glipiZIDE (GLUCOTROL XL) 10 MG 24 hr tablet Take 10 mg by mouth daily.    . metFORMIN (GLUCOPHAGE) 1000 MG tablet Take 1,000 mg by mouth 2 (two) times daily with a meal. (Patient not taking: Reported on 07/19/2020)    . nitroGLYCERIN (NITROSTAT) 0.4 MG SL tablet Place 1 tablet (0.4 mg total)  under the tongue every 5 (five) minutes as needed for chest pain. Please make overdue appt with Dr. Angelena Form before anymore refills. 1st attempt (Patient not taking: Reported on 07/19/2020) 25 tablet 0  . predniSONE (DELTASONE) 20 MG tablet Take 2 tablets (40 mg total) by mouth daily. (Patient not taking: Reported on 07/19/2020) 8 tablet 0  . ranolazine (RANEXA) 500 MG 12 hr tablet Take 1 tablet (500 mg total) by mouth 2 (two) times daily. (Patient not taking: Reported on 07/19/2020) 60 tablet 6  . triamterene-hydrochlorothiazide (DYAZIDE) 37.5-25 MG capsule Take 1 capsule by mouth every morning.     Current Facility-Administered Medications  Medication Dose Route Frequency Provider Last Rate Last Admin  . umeclidinium bromide (INCRUSE ELLIPTA) 62.5 MCG/INH 1 puff  1 puff Inhalation Daily Kasa, Kurian, MD         PHYSICAL EXAMINATION: ECOG PERFORMANCE STATUS: 1 - Symptomatic but completely ambulatory Vitals:   07/19/20 1339  BP: 127/66  Pulse: 84  Temp: 99.3 F (37.4 C)   Filed Weights   07/19/20 1339  Weight: 194 lb (88 kg)   Physical Exam Constitutional:      General: She is not in acute distress.    Appearance: She is obese.  HENT:     Head: Normocephalic and atraumatic.  Eyes:     General: No scleral icterus.    Pupils: Pupils are equal, round, and reactive to light.  Cardiovascular:     Rate and Rhythm: Normal rate and regular rhythm.     Heart sounds: Murmur heard.    Pulmonary:     Effort: Pulmonary effort is normal. No respiratory distress.     Breath sounds: No wheezing.  Abdominal:     General: Bowel sounds are normal. There is no distension.     Palpations: Abdomen is soft. There is no mass.     Tenderness: There is no abdominal tenderness.  Musculoskeletal:        General: No deformity. Normal range of motion.     Cervical back: Normal range of motion and neck supple.  Comments: Bilateral lower extremity trace edema  Skin:    General: Skin is warm and  dry.     Coloration: Skin is not pale.     Findings: No erythema or rash.  Neurological:     Mental Status: She is alert and oriented to person, place, and time. Mental status is at baseline.     Cranial Nerves: No cranial nerve deficit.     Coordination: Coordination normal.  Psychiatric:        Mood and Affect: Mood normal.       CMP Latest Ref Rng & Units 06/15/2020  Glucose 70 - 99 mg/dL 227(H)  BUN 8 - 23 mg/dL 14  Creatinine 0.44 - 1.00 mg/dL 1.02(H)  Sodium 135 - 145 mmol/L 136  Potassium 3.5 - 5.1 mmol/L 3.6  Chloride 98 - 111 mmol/L 100  CO2 22 - 32 mmol/L 24  Calcium 8.9 - 10.3 mg/dL 8.6(L)  Total Protein 6.5 - 8.1 g/dL 6.7  Total Bilirubin 0.3 - 1.2 mg/dL 0.5  Alkaline Phos 38 - 126 U/L 94  AST 15 - 41 U/L 18  ALT 0 - 44 U/L 15   CBC Latest Ref Rng & Units 07/18/2020  WBC 4.0 - 10.5 K/uL 9.7  Hemoglobin 12.0 - 15.0 g/dL 11.6(L)  Hematocrit 36.0 - 46.0 % 35.7(L)  Platelets 150 - 400 K/uL 162     LABORATORY DATA:  I have reviewed the data as listed Lab Results  Component Value Date   WBC 9.7 07/18/2020   HGB 11.6 (L) 07/18/2020   HCT 35.7 (L) 07/18/2020   MCV 85.4 07/18/2020   PLT 162 07/18/2020   Recent Labs    06/04/20 1836 06/15/20 0957  NA 136 136  K 4.3 3.6  CL 102 100  CO2 22 24  GLUCOSE 158* 227*  BUN 11 14  CREATININE 0.92 1.02*  CALCIUM 8.2* 8.6*  GFRNONAA >60 57*  PROT  --  6.7  ALBUMIN  --  2.9*  AST  --  18  ALT  --  15  ALKPHOS  --  94  BILITOT  --  0.5   Iron/TIBC/Ferritin/ %Sat    Component Value Date/Time   IRON 34 07/18/2020 1137   IRON 41 (L) 03/31/2014 1417   TIBC 316 07/18/2020 1137   TIBC 264 03/31/2014 1417   FERRITIN 150 07/18/2020 1137   FERRITIN 406 (H) 03/31/2014 1417   IRONPCTSAT 11 07/18/2020 1137   IRONPCTSAT 16 03/31/2014 1417     No results found.    ASSESSMENT & PLAN:  1. Iron deficiency anemia due to chronic blood loss   2. Anemia due to stage 3a chronic kidney disease ([place])    #Iron  deficiency anemia, Patient has received multiple IV Venofer treatments. History of GI bleeding Labs are reviewed and discussed with patient. Hemoglobin is 11.6, iron saturation is gradually trending down now at low normal end. Recommend patient to proceed with Venofer 200 mg x 1.  Repeat another dose in 1 to 2 weeks.  Etiology of the iron deficiency, possible due to chronic blood loss.  She has a history of GI bleed.  She has not had any EGD/colonoscopy since 2014.  Patient previously adamantly declined GI evaluation due to previous poor experience. Today I had a lengthy discussion with her again.  Due to her severe hearing loss, I communicated with her about my recommendation via writing down notes for her.  She agrees to have a discussion with gastroenterology and prefers to be  sent to a different GI group.  Will refer to Dr. Vicente Males for further discussion.  Anemia of CKD, hemoglobin is above 10.  I will hold off erythropoietin given that hemoglobin is close to 10.   Orders Placed This Encounter  Procedures  . CBC with Differential/Platelet    Standing Status:   Future    Standing Expiration Date:   07/19/2021  . Ferritin    Standing Status:   Future    Standing Expiration Date:   07/19/2021  . Iron and TIBC    Standing Status:   Future    Standing Expiration Date:   07/19/2021  . Ambulatory referral to Gastroenterology    Referral Priority:   Routine    Referral Type:   Consultation    Referral Reason:   Specialty Services Required    Referred to Provider:   Jonathon Bellows, MD    Number of Visits Requested:   1    All questions were answered. The patient knows to call the clinic with any problems questions or concerns.  Return of visit: 2 months   Earlie Server, MD, PhD Hematology Oncology [facility] Pager- [phone] 07/19/2020

## 2020-07-19 NOTE — Progress Notes (Signed)
Pt tolerated infusion well. Pt and VS stable at discharge.  

## 2020-07-19 NOTE — Progress Notes (Signed)
Patient here for follow up. No new concerns voiced.  °

## 2020-07-25 ENCOUNTER — Other Ambulatory Visit: Payer: Medicare HMO

## 2020-07-27 ENCOUNTER — Ambulatory Visit: Payer: Medicare HMO | Admitting: Oncology

## 2020-07-27 ENCOUNTER — Ambulatory Visit: Payer: Medicare HMO

## 2020-07-29 ENCOUNTER — Ambulatory Visit: Payer: Medicare HMO

## 2020-08-01 ENCOUNTER — Inpatient Hospital Stay: Payer: Medicare HMO

## 2020-08-01 VITALS — BP 176/76 | HR 54 | Temp 98.9°F | Resp 20

## 2020-08-01 DIAGNOSIS — N1831 Chronic kidney disease, stage 3a: Secondary | ICD-10-CM | POA: Diagnosis not present

## 2020-08-01 DIAGNOSIS — D500 Iron deficiency anemia secondary to blood loss (chronic): Secondary | ICD-10-CM

## 2020-08-01 MED ORDER — IRON SUCROSE 20 MG/ML IV SOLN
200.0000 mg | Freq: Once | INTRAVENOUS | Status: AC
  Administered 2020-08-01: 200 mg via INTRAVENOUS
  Filled 2020-08-01: qty 10

## 2020-08-01 MED ORDER — SODIUM CHLORIDE 0.9 % IV SOLN
INTRAVENOUS | Status: DC
  Filled 2020-08-01: qty 250

## 2020-08-08 ENCOUNTER — Other Ambulatory Visit: Payer: Self-pay

## 2020-08-08 ENCOUNTER — Emergency Department: Payer: Medicare HMO

## 2020-08-08 ENCOUNTER — Inpatient Hospital Stay
Admission: EM | Admit: 2020-08-08 | Discharge: 2020-08-09 | DRG: 291 | Disposition: A | Discharge status: Home / Self Care | Payer: Medicare HMO | Attending: Family Medicine | Admitting: Family Medicine

## 2020-08-08 DIAGNOSIS — Z885 Allergy status to narcotic agent status: Secondary | ICD-10-CM | POA: Diagnosis not present

## 2020-08-08 DIAGNOSIS — I2510 Atherosclerotic heart disease of native coronary artery without angina pectoris: Secondary | ICD-10-CM | POA: Diagnosis present

## 2020-08-08 DIAGNOSIS — Z961 Presence of intraocular lens: Secondary | ICD-10-CM | POA: Diagnosis present

## 2020-08-08 DIAGNOSIS — E1151 Type 2 diabetes mellitus with diabetic peripheral angiopathy without gangrene: Secondary | ICD-10-CM | POA: Diagnosis present

## 2020-08-08 DIAGNOSIS — Z87891 Personal history of nicotine dependence: Secondary | ICD-10-CM

## 2020-08-08 DIAGNOSIS — Z8249 Family history of ischemic heart disease and other diseases of the circulatory system: Secondary | ICD-10-CM

## 2020-08-08 DIAGNOSIS — Z6831 Body mass index (BMI) 31.0-31.9, adult: Secondary | ICD-10-CM

## 2020-08-08 DIAGNOSIS — G8929 Other chronic pain: Secondary | ICD-10-CM | POA: Diagnosis present

## 2020-08-08 DIAGNOSIS — Z91040 Latex allergy status: Secondary | ICD-10-CM | POA: Diagnosis not present

## 2020-08-08 DIAGNOSIS — J449 Chronic obstructive pulmonary disease, unspecified: Secondary | ICD-10-CM | POA: Diagnosis present

## 2020-08-08 DIAGNOSIS — Z951 Presence of aortocoronary bypass graft: Secondary | ICD-10-CM | POA: Diagnosis not present

## 2020-08-08 DIAGNOSIS — M3500 Sicca syndrome, unspecified: Secondary | ICD-10-CM | POA: Diagnosis present

## 2020-08-08 DIAGNOSIS — J9611 Chronic respiratory failure with hypoxia: Secondary | ICD-10-CM | POA: Diagnosis present

## 2020-08-08 DIAGNOSIS — K219 Gastro-esophageal reflux disease without esophagitis: Secondary | ICD-10-CM | POA: Diagnosis present

## 2020-08-08 DIAGNOSIS — Z888 Allergy status to other drugs, medicaments and biological substances status: Secondary | ICD-10-CM

## 2020-08-08 DIAGNOSIS — Z8616 Personal history of COVID-19: Secondary | ICD-10-CM

## 2020-08-08 DIAGNOSIS — E7800 Pure hypercholesterolemia, unspecified: Secondary | ICD-10-CM | POA: Diagnosis present

## 2020-08-08 DIAGNOSIS — I25118 Atherosclerotic heart disease of native coronary artery with other forms of angina pectoris: Secondary | ICD-10-CM | POA: Diagnosis present

## 2020-08-08 DIAGNOSIS — E785 Hyperlipidemia, unspecified: Secondary | ICD-10-CM | POA: Diagnosis present

## 2020-08-08 DIAGNOSIS — I441 Atrioventricular block, second degree: Secondary | ICD-10-CM | POA: Diagnosis present

## 2020-08-08 DIAGNOSIS — I110 Hypertensive heart disease with heart failure: Principal | ICD-10-CM | POA: Diagnosis present

## 2020-08-08 DIAGNOSIS — R079 Chest pain, unspecified: Secondary | ICD-10-CM

## 2020-08-08 DIAGNOSIS — E039 Hypothyroidism, unspecified: Secondary | ICD-10-CM | POA: Diagnosis not present

## 2020-08-08 DIAGNOSIS — E1143 Type 2 diabetes mellitus with diabetic autonomic (poly)neuropathy: Secondary | ICD-10-CM | POA: Diagnosis present

## 2020-08-08 DIAGNOSIS — K3184 Gastroparesis: Secondary | ICD-10-CM | POA: Diagnosis present

## 2020-08-08 DIAGNOSIS — Z9841 Cataract extraction status, right eye: Secondary | ICD-10-CM

## 2020-08-08 DIAGNOSIS — R110 Nausea: Secondary | ICD-10-CM | POA: Diagnosis present

## 2020-08-08 DIAGNOSIS — Z20822 Contact with and (suspected) exposure to covid-19: Secondary | ICD-10-CM | POA: Diagnosis present

## 2020-08-08 DIAGNOSIS — Z7951 Long term (current) use of inhaled steroids: Secondary | ICD-10-CM

## 2020-08-08 DIAGNOSIS — Z803 Family history of malignant neoplasm of breast: Secondary | ICD-10-CM

## 2020-08-08 DIAGNOSIS — Z79890 Hormone replacement therapy (postmenopausal): Secondary | ICD-10-CM | POA: Diagnosis not present

## 2020-08-08 DIAGNOSIS — I5033 Acute on chronic diastolic (congestive) heart failure: Secondary | ICD-10-CM | POA: Diagnosis present

## 2020-08-08 DIAGNOSIS — F32A Depression, unspecified: Secondary | ICD-10-CM | POA: Diagnosis present

## 2020-08-08 DIAGNOSIS — R918 Other nonspecific abnormal finding of lung field: Secondary | ICD-10-CM

## 2020-08-08 DIAGNOSIS — I509 Heart failure, unspecified: Secondary | ICD-10-CM | POA: Diagnosis present

## 2020-08-08 DIAGNOSIS — H9190 Unspecified hearing loss, unspecified ear: Secondary | ICD-10-CM | POA: Diagnosis present

## 2020-08-08 DIAGNOSIS — D509 Iron deficiency anemia, unspecified: Secondary | ICD-10-CM | POA: Diagnosis present

## 2020-08-08 DIAGNOSIS — E669 Obesity, unspecified: Secondary | ICD-10-CM | POA: Diagnosis present

## 2020-08-08 DIAGNOSIS — Z9981 Dependence on supplemental oxygen: Secondary | ICD-10-CM | POA: Diagnosis not present

## 2020-08-08 DIAGNOSIS — Z7984 Long term (current) use of oral hypoglycemic drugs: Secondary | ICD-10-CM

## 2020-08-08 DIAGNOSIS — Z953 Presence of xenogenic heart valve: Secondary | ICD-10-CM

## 2020-08-08 DIAGNOSIS — E890 Postprocedural hypothyroidism: Secondary | ICD-10-CM | POA: Diagnosis present

## 2020-08-08 DIAGNOSIS — Z794 Long term (current) use of insulin: Secondary | ICD-10-CM

## 2020-08-08 DIAGNOSIS — G4733 Obstructive sleep apnea (adult) (pediatric): Secondary | ICD-10-CM | POA: Diagnosis present

## 2020-08-08 DIAGNOSIS — Z823 Family history of stroke: Secondary | ICD-10-CM

## 2020-08-08 DIAGNOSIS — Z825 Family history of asthma and other chronic lower respiratory diseases: Secondary | ICD-10-CM

## 2020-08-08 DIAGNOSIS — F419 Anxiety disorder, unspecified: Secondary | ICD-10-CM | POA: Diagnosis present

## 2020-08-08 DIAGNOSIS — Z79899 Other long term (current) drug therapy: Secondary | ICD-10-CM

## 2020-08-08 DIAGNOSIS — E119 Type 2 diabetes mellitus without complications: Secondary | ICD-10-CM

## 2020-08-08 DIAGNOSIS — Z9842 Cataract extraction status, left eye: Secondary | ICD-10-CM

## 2020-08-08 DIAGNOSIS — I10 Essential (primary) hypertension: Secondary | ICD-10-CM | POA: Diagnosis not present

## 2020-08-08 DIAGNOSIS — Z91013 Allergy to seafood: Secondary | ICD-10-CM

## 2020-08-08 LAB — BASIC METABOLIC PANEL
Anion gap: 10 (ref 5–15)
BUN: 15 mg/dL (ref 8–23)
CO2: 20 mmol/L — ABNORMAL LOW (ref 22–32)
Calcium: 8.7 mg/dL — ABNORMAL LOW (ref 8.9–10.3)
Chloride: 107 mmol/L (ref 98–111)
Creatinine, Ser: 0.88 mg/dL (ref 0.44–1.00)
GFR, Estimated: >60 mL/min (ref >60)
Glucose, Bld: 128 mg/dL — ABNORMAL HIGH (ref 70–99)
Potassium: 4.3 mmol/L (ref 3.5–5.1)
Sodium: 137 mmol/L (ref 135–145)

## 2020-08-08 LAB — CBC
HCT: 33.4 % — ABNORMAL LOW (ref 36.0–46.0)
Hemoglobin: 10.7 g/dL — ABNORMAL LOW (ref 12.0–15.0)
MCH: 27.9 pg (ref 26.0–34.0)
MCHC: 32 g/dL (ref 30.0–36.0)
MCV: 87.2 fL (ref 80.0–100.0)
Platelets: 142 10*3/uL — ABNORMAL LOW (ref 150–400)
RBC: 3.83 MIL/uL — ABNORMAL LOW (ref 3.87–5.11)
RDW: 15.5 % (ref 11.5–15.5)
WBC: 7.6 10*3/uL (ref 4.0–10.5)
nRBC: 0 % (ref 0.0–0.2)

## 2020-08-08 LAB — RESP PANEL BY RT-PCR (FLU A&B, COVID) ARPGX2
Influenza A by PCR: NEGATIVE
Influenza B by PCR: NEGATIVE
SARS Coronavirus 2 by RT PCR: NEGATIVE

## 2020-08-08 LAB — TROPONIN I (HIGH SENSITIVITY)
Troponin I (High Sensitivity): 15 ng/L (ref <18)
Troponin I (High Sensitivity): 13 ng/L (ref <18)
Troponin I (High Sensitivity): 15 ng/L (ref <18)

## 2020-08-08 LAB — MAGNESIUM: Magnesium: 1.7 mg/dL (ref 1.7–2.4)

## 2020-08-08 LAB — TSH: TSH: 1.122 u[IU]/mL (ref 0.350–4.500)

## 2020-08-08 LAB — CBG MONITORING, ED
Glucose-Capillary: 106 mg/dL — ABNORMAL HIGH (ref 70–99)
Glucose-Capillary: 234 mg/dL — ABNORMAL HIGH (ref 70–99)

## 2020-08-08 LAB — HEMOGLOBIN A1C
Hgb A1c MFr Bld: 6.6 % — ABNORMAL HIGH (ref 4.8–5.6)
Mean Plasma Glucose: 142.72 mg/dL

## 2020-08-08 LAB — GLUCOSE, CAPILLARY: Glucose-Capillary: 167 mg/dL — ABNORMAL HIGH (ref 70–99)

## 2020-08-08 LAB — BRAIN NATRIURETIC PEPTIDE: B Natriuretic Peptide: 395.1 pg/mL — ABNORMAL HIGH (ref 0.0–100.0)

## 2020-08-08 LAB — D-DIMER, QUANTITATIVE: D-Dimer, Quant: 1.86 ug/mL-FEU — ABNORMAL HIGH (ref 0.00–0.50)

## 2020-08-08 MED ORDER — ONDANSETRON HCL 4 MG/2ML IJ SOLN
4.0000 mg | INTRAMUSCULAR | Status: AC
  Administered 2020-08-08: 4 mg via INTRAVENOUS
  Filled 2020-08-08: qty 2

## 2020-08-08 MED ORDER — ENOXAPARIN SODIUM 40 MG/0.4ML ~~LOC~~ SOLN
40.0000 mg | SUBCUTANEOUS | Status: DC
  Administered 2020-08-08: 40 mg via SUBCUTANEOUS
  Filled 2020-08-08: qty 0.4

## 2020-08-08 MED ORDER — INSULIN ASPART 100 UNIT/ML ~~LOC~~ SOLN
0.0000 [IU] | Freq: Three times a day (TID) | SUBCUTANEOUS | Status: DC
  Administered 2020-08-08: 7 [IU] via SUBCUTANEOUS
  Administered 2020-08-09: 11 [IU] via SUBCUTANEOUS
  Filled 2020-08-08 (×2): qty 1

## 2020-08-08 MED ORDER — PANTOPRAZOLE SODIUM 20 MG PO TBEC
20.0000 mg | DELAYED_RELEASE_TABLET | Freq: Every day | ORAL | Status: DC
  Administered 2020-08-09: 20 mg via ORAL
  Filled 2020-08-08: qty 1

## 2020-08-08 MED ORDER — METOCLOPRAMIDE HCL 10 MG PO TABS
5.0000 mg | ORAL_TABLET | Freq: Three times a day (TID) | ORAL | Status: DC
  Administered 2020-08-09 (×2): 5 mg via ORAL
  Filled 2020-08-08 (×2): qty 1

## 2020-08-08 MED ORDER — ARIPIPRAZOLE 2 MG PO TABS
2.0000 mg | ORAL_TABLET | Freq: Every day | ORAL | Status: DC
  Administered 2020-08-09: 2 mg via ORAL
  Filled 2020-08-08 (×2): qty 1

## 2020-08-08 MED ORDER — LEVOTHYROXINE SODIUM 25 MCG PO TABS
125.0000 ug | ORAL_TABLET | Freq: Every day | ORAL | Status: DC
  Administered 2020-08-09: 125 ug via ORAL
  Filled 2020-08-08: qty 1

## 2020-08-08 MED ORDER — METOCLOPRAMIDE HCL 5 MG/ML IJ SOLN
10.0000 mg | Freq: Once | INTRAMUSCULAR | Status: AC
  Administered 2020-08-08: 10 mg via INTRAVENOUS
  Filled 2020-08-08: qty 2

## 2020-08-08 MED ORDER — ASPIRIN 81 MG PO CHEW
324.0000 mg | CHEWABLE_TABLET | Freq: Once | ORAL | Status: AC
  Administered 2020-08-08: 324 mg via ORAL
  Filled 2020-08-08: qty 4

## 2020-08-08 MED ORDER — SODIUM CHLORIDE 0.9% FLUSH
3.0000 mL | Freq: Two times a day (BID) | INTRAVENOUS | Status: DC
  Administered 2020-08-08 – 2020-08-09 (×2): 3 mL via INTRAVENOUS

## 2020-08-08 MED ORDER — ALBUTEROL SULFATE (2.5 MG/3ML) 0.083% IN NEBU
2.5000 mg | INHALATION_SOLUTION | RESPIRATORY_TRACT | Status: AC
  Administered 2020-08-08: 2.5 mg via RESPIRATORY_TRACT
  Filled 2020-08-08: qty 3

## 2020-08-08 MED ORDER — IOHEXOL 350 MG/ML SOLN
100.0000 mL | Freq: Once | INTRAVENOUS | Status: AC | PRN
  Administered 2020-08-08: 100 mL via INTRAVENOUS

## 2020-08-08 MED ORDER — SODIUM CHLORIDE 0.9 % IV SOLN: 250.0000 mL | INTRAVENOUS | Status: DC | PRN

## 2020-08-08 MED ORDER — NITROGLYCERIN 2 % TD OINT
0.5000 [in_us] | TOPICAL_OINTMENT | TRANSDERMAL | Status: AC
  Administered 2020-08-08: 0.5 [in_us] via TOPICAL
  Filled 2020-08-08: qty 1

## 2020-08-08 MED ORDER — NITROGLYCERIN 0.4 MG SL SUBL: 0.4000 mg | SUBLINGUAL_TABLET | SUBLINGUAL | Status: DC | PRN

## 2020-08-08 MED ORDER — TRAZODONE HCL 100 MG PO TABS
100.0000 mg | ORAL_TABLET | Freq: Every day | ORAL | Status: DC
  Administered 2020-08-08: 100 mg via ORAL

## 2020-08-08 MED ORDER — PROMETHAZINE HCL 25 MG/ML IJ SOLN
12.5000 mg | Freq: Once | INTRAMUSCULAR | Status: AC
  Administered 2020-08-08: 12.5 mg via INTRAVENOUS
  Filled 2020-08-08: qty 1

## 2020-08-08 MED ORDER — BISOPROLOL FUMARATE 5 MG PO TABS
5.0000 mg | ORAL_TABLET | Freq: Every day | ORAL | Status: DC
  Administered 2020-08-09: 5 mg via ORAL
  Filled 2020-08-08: qty 1

## 2020-08-08 MED ORDER — FUROSEMIDE 10 MG/ML IJ SOLN
60.0000 mg | Freq: Once | INTRAMUSCULAR | Status: AC
  Administered 2020-08-08: 60 mg via INTRAVENOUS
  Filled 2020-08-08: qty 8

## 2020-08-08 MED ORDER — ACETAMINOPHEN 500 MG PO TABS
1000.0000 mg | ORAL_TABLET | ORAL | Status: AC
  Administered 2020-08-08: 1000 mg via ORAL
  Filled 2020-08-08: qty 2

## 2020-08-08 MED ORDER — CITALOPRAM HYDROBROMIDE 20 MG PO TABS
40.0000 mg | ORAL_TABLET | Freq: Every morning | ORAL | Status: DC
  Administered 2020-08-09: 40 mg via ORAL
  Filled 2020-08-08: qty 2

## 2020-08-08 MED ORDER — UMECLIDINIUM BROMIDE 62.5 MCG/INH IN AEPB
1.0000 | INHALATION_SPRAY | Freq: Every day | RESPIRATORY_TRACT | Status: DC
  Administered 2020-08-08 – 2020-08-09 (×2): 1 via RESPIRATORY_TRACT
  Filled 2020-08-08: qty 7

## 2020-08-08 MED ORDER — ONDANSETRON HCL 4 MG/2ML IJ SOLN
4.0000 mg | Freq: Four times a day (QID) | INTRAMUSCULAR | Status: DC | PRN
  Administered 2020-08-08 – 2020-08-09 (×2): 4 mg via INTRAVENOUS
  Filled 2020-08-08 (×2): qty 2

## 2020-08-08 MED ORDER — ALBUTEROL SULFATE (2.5 MG/3ML) 0.083% IN NEBU: 2.5000 mg | INHALATION_SOLUTION | RESPIRATORY_TRACT | Status: DC | PRN

## 2020-08-08 MED ORDER — FUROSEMIDE 10 MG/ML IJ SOLN
40.0000 mg | Freq: Two times a day (BID) | INTRAMUSCULAR | Status: DC
  Administered 2020-08-09: 40 mg via INTRAVENOUS
  Filled 2020-08-08: qty 4

## 2020-08-08 MED ORDER — ASPIRIN EC 81 MG PO TBEC
81.0000 mg | DELAYED_RELEASE_TABLET | Freq: Every day | ORAL | Status: DC
  Administered 2020-08-09: 81 mg via ORAL
  Filled 2020-08-08: qty 1

## 2020-08-08 MED ORDER — INSULIN ASPART PROT & ASPART (70-30 MIX) 100 UNIT/ML ~~LOC~~ SUSP
70.0000 [IU] | Freq: Two times a day (BID) | SUBCUTANEOUS | Status: DC
  Administered 2020-08-09: 70 [IU] via SUBCUTANEOUS
  Filled 2020-08-08: qty 10

## 2020-08-08 MED ORDER — SODIUM CHLORIDE 0.9% FLUSH: 3.0000 mL | INTRAVENOUS | Status: DC | PRN

## 2020-08-08 NOTE — ED Provider Notes (Signed)
[facility] Emergency Department Provider Note   ____________________________________________   Event Date/Time   First MD Initiated Contact with Patient 08/08/20 [phone]     (approximate)  I have reviewed the triage vital signs and the nursing notes.   HISTORY  Chief Complaint Chest Pain    HPI A 75 year old patient with a history of treated diabetes, hypertension, hypercholesterolemia and obesity presents for evaluation of chest pain. Initial onset of pain was approximately 1-3 hours ago. The patient's chest pain is described as heaviness/pressure/tightness and is worse with exertion. The patient complains of nausea and reports some diaphoresis. The patient's chest pain is middle- or left-sided, is not well-localized, is not sharp and does not radiate to the arms/jaw/neck. The patient has no history of stroke, has no history of peripheral artery disease, has not smoked in the past 90 days and has no relevant family history of coronary artery disease (first degree relative at less than age 33).   No abdominal pain. Been experiencing some shortness of breath since yesterday having use her home oxygen even during the day which typically does not. Started feeling chest tightness as well as nausea. No abdominal pain, but reports she feels like started vomiting because of pain in her chest and shortness of breath now.    Past Medical History:  Diagnosis Date  . 1st degree AV block   . ACE-inhibitor cough   . Allergic rhinitis   . Anemia    iron deficiency anemia  . Anxiety   . Aortic ectasia (HCC)    a. CT abd in 12/2016 incidentally noted aortic atherosclerosis and infrarenal abdominal aortic ectasia measuring as large as 2.7 cm with recommendation to repeat US in 2023.  Marland Kitchen Arthritis   . Asthma   . Cataract   . Chronic depression   . Chronic diastolic CHF (congestive heart failure) ([place])   . Chronic headache   . COPD (chronic obstructive pulmonary disease)  ([place])   . Coronary artery disease    a. DES to RCA and mid Cx 2009. b. CABG and bioprosthetic AVR May 2015. c. cutting balloon to prox Cx in 05/2016  . Diabetes mellitus    type 2  . Diverticulitis of colon   . Essential hypertension   . GERD (gastroesophageal reflux disease)   . Hearing loss   . History of blood transfusion 2013  . History of prosthetic aortic valve replacement   . HOH (hard of hearing)   . Hypercholesterolemia    intolerance of statins and niaspan  . IDA (iron deficiency anemia) 02/03/2019  . Mobitz type 1 second degree AV block   . OSA (obstructive sleep apnea)    mild, intolerant of cpap  . PAD (peripheral artery disease) ([place])    a. atherosclerosis by CT abd 12/2016 in [place].  Marland Kitchen PONV (postoperative nausea and vomiting)   . Statin intolerance   . Thyroid disease     Patient Active Problem List   Diagnosis Date Noted  . Anemia due to stage 3a chronic kidney disease ([place]) 05/21/2019  . IDA (iron deficiency anemia) 02/03/2019  . COPD exacerbation ([place]) 12/28/2017  . Depression, major, single episode, complete remission ([place]) 04/11/2017  . 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 Aortic ectasia, abdominal ([place]) 01/01/2017  . GAD (generalized anxiety disorder) 09/26/2016  . Acute posthemorrhagic anemia 08/24/2016  . Acute respiratory failure with hypoxia ([place]) 08/24/2016  . Acute diastolic CHF (congestive heart failure) ([place]) 08/24/2016  . Hyponatremia 08/24/2016  . Leukocytosis 08/24/2016  .  Fever 08/24/2016  . Diarrhea 08/24/2016  . Generalized weakness 08/24/2016  . Acute diverticulitis 08/16/2016  . Gastrointestinal hemorrhage   . Ataxia 08/09/2016  . Dizziness 08/09/2016  . Staggering gait 08/09/2016  . NSTEMI (non-ST elevated myocardial infarction) ([place])   . AVB (atrioventricular block)   . Atypical chest pain   . Unstable angina ([place]) 05/25/2016  . Palpitations 05/25/2016  . PUD - recent Rx for H.Pylori 05/25/2016  . Stenosis of coronary  artery stent   . Recurrent major depressive disorder, in partial remission ([place]) 07/29/2015  . Sialadenitis 04/25/2015  . Viral URI 03/24/2015  . Hypoglycemia 08/25/2014  . Nausea without vomiting 08/25/2014  . Dyslipidemia-statin ontol   . Long-term insulin use ([place]) 03/31/2014  . Aortic valve disorder 11/11/2013  . 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 Coronary Artery Disease s/p CABG 10/2013 10/10/2013  . Other malaise and fatigue 09/11/2013  . Alopecia 03/06/2013  . Insomnia 02/06/2013  . Gastroesophageal reflux disease without esophagitis 01/19/2013  . Dysphagia, unspecified(787.20) 01/19/2013  . Dyspnea 11/17/2012  . Weakness 11/17/2012  . Abnormal MRI of head 09/05/2012  . Depression with anxiety 03/21/2012  . Extrinsic asthma 11/12/2011  . Anemia 06/07/2011  . Chronic obstructive pulmonary disease ([place]) 01/25/2010  . Acquired hypothyroidism 08/10/2009  . MICROSCOPIC HEMATURIA 05/07/2009  . GOITER 03/02/2009  . Essential hypertension 01/06/2009  . MEMORY LOSS 06/23/2008  . Obstructive sleep apnea 12/25/2007  . Insulin dependent diabetes mellitus ([place]) 09/09/2006  . ALLERGIC RHINITIS 09/09/2006  . DIVERTICULOSIS, COLON 09/09/2006    Past Surgical History:  Procedure Laterality Date  . ABDOMINAL HYSTERECTOMY    . ABDOMINAL HYSTERECTOMY W/ PARTIAL VAGINACTOMY    . AORTIC VALVE REPLACEMENT N/A 10/12/2013   Procedure: AORTIC VALVE REPLACEMENT (AVR);  Surgeon: [name], MD;  Location: [place];  Service: Open Heart Surgery;  Laterality: N/A;  . [place]  . BARTHOLIN GLAND CYST EXCISION    . BLADDER SUSPENSION    . BREAST BIOPSY Bilateral 09/11/2000   neg  . BREAST BIOPSY Left 07/24/2010   neg  . BREAST CYST EXCISION  1988   bilateral nonmalignant tumors, x3  . CARDIAC CATHETERIZATION    . CARDIAC CATHETERIZATION N/A 05/25/2016   Procedure: Coronary Balloon Angioplasty;  Surgeon: Leonie Man, MD;  Location: [facility] [facility];  Service: Cardiovascular;   Laterality: N/A;  . CARDIAC CATHETERIZATION N/A 05/25/2016   Procedure: Coronary/Graft Angiography;  Surgeon: Leonie Man, MD;  Location: [place] [facility];  Service: Cardiovascular;  Laterality: N/A;  . CATARACT EXTRACTION W/ INTRAOCULAR LENS  IMPLANT, BILATERAL    . CHOLECYSTECTOMY  2001  . COLECTOMY     lap sigmoid  . COLONOSCOPY  2014   polyps found, 2 clamped off.  . CORONARY ANGIOPLASTY  10/29/2007   Prox RCA & Mid Cx.  . CORONARY ARTERY BYPASS GRAFT N/A 10/12/2013   Procedure: CORONARY ARTERY BYPASS GRAFT TIMES TWO;  Surgeon: [name], MD;  Location: [facility];  Service: Open Heart Surgery;  Laterality: N/A;  . CORONARY/GRAFT ANGIOGRAPHY N/A 09/20/2017   Procedure: CORONARY/GRAFT ANGIOGRAPHY;  Surgeon: Sherren Mocha, MD;  Location: [place] [facility];  Service: Cardiovascular;  Laterality: N/A;  . LEFT HEART CATHETERIZATION WITH CORONARY ANGIOGRAM N/A 10/09/2013   Procedure: LEFT HEART CATHETERIZATION WITH CORONARY ANGIOGRAM;  Surgeon: [name], MD;  Location: [facility] C[facility];  Service: Cardiovascular;  Laterality: N/A;  . STERNAL WIRES REMOVAL N/A 04/13/2014   Procedure: STERNAL WIRES REMOVAL;  Surgeon: Gaspar Bidding  Alveria Apley, MD;  Location: [facility];  Service: Thoracic;  Laterality: N/A;  . THYROIDECTOMY    . TONSILLECTOMY    . TUBAL LIGATION    . VAGINAL DELIVERY     3  . VISCERAL ARTERY INTERVENTION N/A 08/16/2016   Procedure: Visceral Artery Intervention;  Surgeon: Algernon Huxley, MD;  Location: [place] [facility];  Service: Cardiovascular;  Laterality: N/A;    Prior to Admission medications   Medication Sig Start Date End Date Taking? Authorizing Provider  albuterol (PROVENTIL) (2.5 MG/3ML) 0.083% nebulizer solution Take 2.5 mg by nebulization every 4 (four) hours as needed. 06/07/20   [provider]  ARIPiprazole (ABILIFY) 2 MG tablet Take 2 mg by mouth daily.    [provider]  azithromycin (ZITHROMAX Z-PAK) 250 MG tablet Take 2 tablets (500 mg) on   Day 1,  followed by 1 tablet (250 mg) once daily on Days 2 through 5. Patient not taking: Reported on 07/19/2020 06/04/20   Carrie Mew, MD  budesonide (PULMICORT) 0.5 MG/2ML nebulizer solution Take 0.5 mg by nebulization daily.    [provider]  Calcium-Vitamin D (CALTRATE 600 PLUS-VIT D PO) Take 1 tablet by mouth 2 (two) times daily.    [provider]  citalopram (CELEXA) 40 MG tablet Take 40 mg by mouth every morning. 03/30/19   [provider]  DULoxetine (CYMBALTA) 20 MG capsule TAKE 1 CAPSULE BY MOUTH ONCE DAILY FOR 7 DAYS AND THEN INCREASE TO 2 CAPSULES DAILY WITH BREAKFAST 05/09/19   [provider]  furosemide (LASIX) 20 MG tablet Take 20 mg by mouth daily as needed for fluid or edema.     [provider]  glipiZIDE (GLUCOTROL XL) 10 MG 24 hr tablet Take 10 mg by mouth daily. 07/19/20   [provider]  insulin NPH-regular Human (NOVOLIN 70/30) (70-30) 100 UNIT/ML injection Inject 70 Units into the skin 2 (two) times daily with a meal. Inject 70u under the skin every morning at breakfast and inject 70u under the skin every evening with supper    [provider]  levothyroxine (SYNTHROID, LEVOTHROID) 125 MCG tablet Take 125 mcg by mouth daily.  04/03/17   [provider]  metFORMIN (GLUCOPHAGE) 1000 MG tablet Take 1,000 mg by mouth 2 (two) times daily with a meal. Patient not taking: Reported on 07/19/2020    [provider]  nitroGLYCERIN (NITROSTAT) 0.4 MG SL tablet Place 1 tablet (0.4 mg total) under the tongue every 5 (five) minutes as needed for chest pain. Please make overdue appt with Dr. Angelena Form before anymore refills. 1st attempt Patient not taking: Reported on 07/19/2020 09/30/19   [name], MD  ondansetron (ZOFRAN ODT) 4 MG disintegrating tablet Take 1 tablet (4 mg total) by mouth every 8 (eight) hours as needed for nausea or vomiting. 06/04/20   Carrie Mew, MD  pantoprazole  (PROTONIX) 20 MG tablet Take 20 mg by mouth daily. 01/23/17   [provider]  predniSONE (DELTASONE) 20 MG tablet Take 2 tablets (40 mg total) by mouth daily. Patient not taking: Reported on 07/19/2020 06/04/20   Carrie Mew, MD  promethazine (PHENERGAN) 25 MG tablet Take 25 mg by mouth every 6 (six) hours as needed for nausea or vomiting.    [provider]  ranolazine (RANEXA) 500 MG 12 hr tablet Take 1 tablet (500 mg total) by mouth 2 (two) times daily. Patient not taking: Reported on 07/19/2020 03/05/18   Burtis Junes, NP  traZODone [facility])  100 MG tablet Take 100 mg by mouth at bedtime.    [provider]  triamterene-hydrochlorothiazide (DYAZIDE) 37.5-25 MG capsule Take 1 capsule by mouth every morning. 02/16/20   [provider]    Allergies Amitriptyline, Benadryl [diphenhydramine], Demerol [meperidine], Gabapentin, Loratadine, Meperidine hcl, Mirtazapine, Olanzapine, Voltaren [diclofenac sodium], Zetia [ezetimibe], Ativan [lorazepam], Atorvastatin, Budesonide-formoterol fumarate, Bupropion hcl, Caffeine, Codeine sulfate, Lisinopril, Metformin, Mometasone furoate, Morphine sulfate, Other, Oxycodone-acetaminophen, Pioglitazone, Propoxyphene n-acetaminophen, Rosuvastatin, Shellfish allergy, Suvorexant, Ticagrelor, Tramadol, Trazodone and nefazodone, Venlafaxine, Zolpidem tartrate, and Latex  Family History  Problem Relation Age of Onset  . Breast cancer Mother 56  . Hypertension Father   . Mesothelioma Father   . Asthma Father   . Stroke Paternal Grandfather   . Heart disease Other   . Breast cancer Maternal Aunt   . Breast cancer Paternal Aunt     Social History Social History   Tobacco Use  . Smoking status: Former Smoker    Packs/day: 0.50    Years: 30.00    Pack years: 15.00    Types: Cigarettes    Quit date: 10/02/2013    Years since quitting: 6.8  . Smokeless tobacco: Never Used  Vaping Use  . Vaping Use: Never used  Substance  Use Topics  . Alcohol use: No  . Drug use: No    Review of Systems Constitutional: No fever/chills Eyes: No visual changes. ENT: No sore throat. Cardiovascular: Tightness across the chest moderate Respiratory: Moderate shortness of breath. Slept recliner last night Gastrointestinal: No abdominal pain.   Genitourinary: Negative for dysuria. Musculoskeletal: Negative for back pain. Skin: Negative for rash. No leg swelling. Neurological: Negative for headaches, areas of focal weakness or numbness.    ____________________________________________   PHYSICAL EXAM:  VITAL SIGNS: ED Triage Vitals [08/08/20 0716]  Enc Vitals Group     BP (!) 180/83     Pulse Rate 66     Resp (!) 28     Temp 97.7 F (36.5 C)     Temp Source Oral     SpO2 96 %     Weight 195 lb (88.5 kg)     Height 5\' 6"  (1.676 m)     Head Circumference      Peak Flow      Pain Score 7     Pain Loc      Pain Edu?      Excl. in [place]?     Constitutional: Alert and oriented. Somewhat ill-appearing, sitting up on the bed with active emesis of stomach contents into emesis bag. She also appears anxious and slightly tachypneic. Eyes: Conjunctivae are normal. Head: Atraumatic. Nose: No congestion/rhinnorhea. Mouth/Throat: Mucous membranes are moist. Neck: No stridor.  Cardiovascular: Normal rate, regular rhythm. Grossly normal heart sounds.  Good peripheral circulation. Respiratory: Mild tachypnea. No retractions. Lungs CTAB. Lung sounds are very clear. She reports shortness of breath and chest pain though. Gastrointestinal: Soft and nontender. No distention. Negative Murphy. No pain to any quadrant on abdominal examination. Musculoskeletal: No lower extremity tenderness nor edema. Neurologic:  Normal speech and language. No gross focal neurologic deficits are appreciated.  Skin:  Skin is warm, dry and intact. No rash noted. Psychiatric: Mood and affect are slightly anxious. Speech and behavior are  normal.  Oxygen saturation 99% on 3 L. I rechecked her blood pressure noted to be 829 systolic ____________________________________________   LABS (all labs ordered are listed, but only abnormal results are displayed)  Labs Reviewed  BASIC METABOLIC PANEL - Abnormal;  Notable for the following components:      Result Value   CO2 20 (*)    Glucose, Bld 128 (*)    Calcium 8.7 (*)    All other components within normal limits  CBC - Abnormal; Notable for the following components:   RBC 3.83 (*)    Hemoglobin 10.7 (*)    HCT 33.4 (*)    Platelets 142 (*)    All other components within normal limits  BRAIN NATRIURETIC PEPTIDE - Abnormal; Notable for the following components:   B Natriuretic Peptide 395.1 (*)    All other components within normal limits  D-DIMER, QUANTITATIVE - Abnormal; Notable for the following components:   D-Dimer, Quant 1.86 (*)    All other components within normal limits  RESP PANEL BY RT-PCR (FLU A&B, COVID) ARPGX2  TROPONIN I (HIGH SENSITIVITY)  TROPONIN I (HIGH SENSITIVITY)   ____________________________________________  EKG  Reviewed interpreted by me at 715 Heart rate 65 QRS 100 QTc 440 Sinus rhythm first-degree AV block. Mild nonspecific T wave abnormality versus slight artifact. Question the presence of a possible U wave. ____________________________________________  RADIOLOGY  DG Chest 2 View  Result Date: 08/08/2020 CLINICAL DATA:  Chest pain EXAM: CHEST - 2 VIEW COMPARISON:  11/13/2020 FINDINGS: Generalized reticulation which may be sequela of airspace disease seen in January. A few opacities have the appearance of Kerley line. Enlarged heart size with aortic tortuosity. Prior CABG. IMPRESSION: Interstitial coarsening which may be sequela of recent COVID infection or mild CHF. Electronically Signed   By: Monte Fantasia M.D.   On: 08/08/2020 07:50   CT ANGIO CHEST AORTA W/CM & OR WO/CM  Result Date: 08/08/2020 CLINICAL DATA:  Shortness of  breath and chest pain EXAM: CT ANGIOGRAPHY CHEST WITHOUT AND WITH CONTRAST TECHNIQUE: Multidetector CT imaging of the chest was performed using the standard protocol before and during bolus administration of intravenous contrast. Multiplanar CT image reconstructions and MIPs were obtained to evaluate the vascular anatomy. CONTRAST:  181mL OMNIPAQUE IOHEXOL 350 MG/ML SOLN COMPARISON:  06/15/2020 FINDINGS: Cardiovascular: Thoracic aorta is normal in caliber without evidence of intramural hematoma or dissection. There is calcified and irregular noncalcified plaque along the arch and descending portion. No evidence of acute pulmonary embolus within the main or lobar branches. Stable heart size. Coronary artery calcification. Aortic valve replacement. Post CABG. Mediastinum/Nodes: Similar size of mildly enlarged lymph node anterior and to the right of the carina. No new enlarged lymph nodes identified. Small hiatal hernia. Lungs/Pleura: Trace pleural effusions. Improvement in patchy opacities since the prior study. Persistent diffusely increased lung attenuation with interlobular septal thickening. No pneumothorax. Upper Abdomen: No acute abnormality. Musculoskeletal: No acute osseous abnormality. Review of the MIP images confirms the above findings. IMPRESSION: No evidence of aortic dissection or saddle pulmonary embolus. Interstitial pulmonary edema and trace pleural effusions. Improvement in patchy consolidative opacities. Electronically Signed   By: Macy Mis M.D.   On: 08/08/2020 10:27    Imaging reviewed, no pulmonary embolism.  There is notable interstitial edema and trace pleural effusions.   ____________________________________________   PROCEDURES  Procedure(s) performed: None  Procedures  Critical Care performed: No  ____________________________________________   INITIAL IMPRESSION / ASSESSMENT AND PLAN / ED COURSE  Pertinent labs & imaging results that were available during my care  of the patient were reviewed by me and considered in my medical decision making (see chart for details).   Differential diagnosis includes, but is not limited to, ACS, aortic dissection, pulmonary embolism, cardiac  tamponade, pneumothorax, pneumonia, pericarditis, myocarditis, GI-related causes including esophagitis/gastritis, and musculoskeletal chest wall pain.    After reviewing the patient's chest x-ray and previous imaging, she has what appears to be a widened type of mediastinum likely from prior surgeries but associated with her chest pain hypertension shortness of breath vomiting we will proceed with CT angiography to evaluate for dissection and also felt less likely pulmonary embolism. Other considerations also given. Broad work-up, further evaluation pending.    Clinical Course as of 08/08/20 1247  Mon Aug 08, 2020  1008 Patient evaluated prior to CT, remains slightly dyspneic and a little bit tachypneic. Overall she is less nauseated not vomiting. Chest pain has improved is having a slight headache now which I suspect is likely secondary to nitro. Blood pressure does remain elevated [MQ]    Clinical Course User Index [MQ] Delman Kitten, MD   ----------------------------------------- 12:29 PM on 08/08/2020 -----------------------------------------  In the patient's current setting, with her presentation today I suspect that she is suffering from volume overload and likely congestive heart failure.  Discussed with the patient, will give additional Lasix, she does maintain mild tachypnea at this point.  Given concerns for having to use oxygen throughout the course of the entire day yesterday as well as her dyspnea and increased work of breathing discussed with her and will admit her to the hospital for further care and management and anticipated need for diuresis for what appears to be CHF at this point.  Patient agreeable  ____________________________________________   FINAL CLINICAL  IMPRESSION(S) / ED DIAGNOSES  Final diagnoses:  Acute congestive heart failure, unspecified heart failure type [facility])        Note:  This document was prepared using Dragon voice recognition software and may include unintentional dictation errors       Delman Kitten, MD 08/08/20 1247

## 2020-08-08 NOTE — ED Triage Notes (Signed)
Pt c/o SOB and chest pain that started last night with nausea, pt is tachypnic on arrival. Pale is color. Grunting.

## 2020-08-08 NOTE — ED Notes (Signed)
See triage note, pt reports shob that started yesterday with chest tightness and nausea. Pt grunting. 95% on RA. Alert and oriented

## 2020-08-08 NOTE — H&P (Signed)
History and Physical    [name]:[id number] DOB: Nov 01, 1945 DOA: 08/08/2020  PCP: Rusty Aus, MD  Patient coming from: home  I have personally briefly reviewed patient's old medical records in [place]  Chief Complaint: SOB,chest pain,nausea since last pm   HPI: DAROTHY [name] is a 75 y.o. female with medical history significant of CADs/p DES to RCA 2009,CABG with Aortic valve repair2015, EGB15/1761 prox Cx, Diastolic heart failure  Grade II,DMII, HTN , GERD,IDA hx of gi bleed, HLD,PAD,hypothyrodism,COPD/asthma, OSA on nocturnal O2 qhs,obesity, history of COVID -19 pna with recover 1/22, Sjogren's, who presents to ED with substernal -left sided chest pain with associated nausea as well as sob. Per patient symptoms started last pm and have been progressive, so patient presented to ed. Patient currently states that her breathing has improved and she no longer has chest tightness. She does however have some nausea, but with zofran was able to eat her meal. Overall feels improved.  Patient denies any fever or chills or abdominal pain , diarrhea or dysuria.   ED Course: Afeb, bp 180/83, hr 66, rr 28 sat 96% on home O2 EKG: snr with nonspecific st- changes , no hyperacute findings  Labs:wbc 7.6, hgb 10.7, plt 142 Na137, K4.3, cl 107, Co2 20glu 128  Ce15.13 BNP395.1 D-dimer:1.86 Respiratory panel: negative Cxr: Interstitial coarsening which may be sequela of recent COVID infection or mild CHF. CTPA No evidence of aortic dissection or saddle pulmonary embolus.  Interstitial pulmonary edema and trace pleural effusions. Improvement in patchy consolidative opacities.  tx in ed lasix 60mg  x 1 ,tylenol ,asa,zofran  Review of Systems: As per HPI otherwise 10 point review of systems negative.   Past Medical History:  Diagnosis Date  . 1st degree AV block   . ACE-inhibitor cough   . Allergic rhinitis   . Anemia    iron deficiency anemia  . Anxiety   . Aortic ectasia  (HCC)    a. CT abd in 12/2016 incidentally noted aortic atherosclerosis and infrarenal abdominal aortic ectasia measuring as large as 2.7 cm with recommendation to repeat US in 2023.  Marland Kitchen Arthritis   . Asthma   . Cataract   . Chronic depression   . Chronic diastolic CHF (congestive heart failure) ([place])   . Chronic headache   . COPD (chronic obstructive pulmonary disease) ([place])   . Coronary artery disease    a. DES to RCA and mid Cx 2009. b. CABG and bioprosthetic AVR May 2015. c. cutting balloon to prox Cx in 05/2016  . Diabetes mellitus    type 2  . Diverticulitis of colon   . Essential hypertension   . GERD (gastroesophageal reflux disease)   . Hearing loss   . History of blood transfusion 2013  . History of prosthetic aortic valve replacement   . HOH (hard of hearing)   . Hypercholesterolemia    intolerance of statins and niaspan  . IDA (iron deficiency anemia) 02/03/2019  . Mobitz type 1 second degree AV block   . OSA (obstructive sleep apnea)    mild, intolerant of cpap  . PAD (peripheral artery disease) ([place])    a. atherosclerosis by CT abd 12/2016 in [place].  Marland Kitchen PONV (postoperative nausea and vomiting)   . Statin intolerance   . Thyroid disease     Past Surgical History:  Procedure Laterality Date  . ABDOMINAL HYSTERECTOMY    . ABDOMINAL HYSTERECTOMY W/ PARTIAL VAGINACTOMY    . AORTIC VALVE REPLACEMENT N/A 10/12/2013  Procedure: AORTIC VALVE REPLACEMENT (AVR);  Surgeon: [name], MD;  Location: [facility];  Service: Open Heart Surgery;  Laterality: N/A;  . [place]  . BARTHOLIN GLAND CYST EXCISION    . BLADDER SUSPENSION    . BREAST BIOPSY Bilateral 09/11/2000   neg  . BREAST BIOPSY Left 07/24/2010   neg  . BREAST CYST EXCISION  1988   bilateral nonmalignant tumors, x3  . CARDIAC CATHETERIZATION    . CARDIAC CATHETERIZATION N/A 05/25/2016   Procedure: Coronary Balloon Angioplasty;  Surgeon: Leonie Man, MD;  Location: [facility];  Service:  Cardiovascular;  Laterality: N/A;  . CARDIAC CATHETERIZATION N/A 05/25/2016   Procedure: Coronary/Graft Angiography;  Surgeon: Leonie Man, MD;  Location: [facility];  Service: Cardiovascular;  Laterality: N/A;  . CATARACT EXTRACTION W/ INTRAOCULAR LENS  IMPLANT, BILATERAL    . CHOLECYSTECTOMY  2001  . COLECTOMY     lap sigmoid  . COLONOSCOPY  2014   polyps found, 2 clamped off.  . CORONARY ANGIOPLASTY  10/29/2007   Prox RCA & Mid Cx.  . CORONARY ARTERY BYPASS GRAFT N/A 10/12/2013   Procedure: CORONARY ARTERY BYPASS GRAFT TIMES TWO;  Surgeon: [name], MD;  Location: [facility];  Service: Open Heart Surgery;  Laterality: N/A;  . CORONARY/GRAFT ANGIOGRAPHY N/A 09/20/2017   Procedure: CORONARY/GRAFT ANGIOGRAPHY;  Surgeon: Sherren Mocha, MD;  Location: [facility];  Service: Cardiovascular;  Laterality: N/A;  . LEFT HEART CATHETERIZATION WITH CORONARY ANGIOGRAM N/A 10/09/2013   Procedure: LEFT HEART CATHETERIZATION WITH CORONARY ANGIOGRAM;  Surgeon: [name], MD;  Location: [facility] CATH LAB;  Service: Cardiovascular;  Laterality: N/A;  . STERNAL WIRES REMOVAL N/A 04/13/2014   Procedure: STERNAL WIRES REMOVAL;  Surgeon: [name], MD;  Location: [facility];  Service: Thoracic;  Laterality: N/A;  . THYROIDECTOMY    . TONSILLECTOMY    . TUBAL LIGATION    . VAGINAL DELIVERY     3  . VISCERAL ARTERY INTERVENTION N/A 08/16/2016   Procedure: Visceral Artery Intervention;  Surgeon: Algernon Huxley, MD;  Location: [facility];  Service: Cardiovascular;  Laterality: N/A;     reports that she quit smoking about 6 years ago. Her smoking use included cigarettes. She has a 15.00 pack-year smoking history. She has never used smokeless tobacco. She reports that she does not drink alcohol and does not use drugs.  Allergies  Allergen Reactions  . Amitriptyline Other (See Comments)    Unknown reaction  . Benadryl [Diphenhydramine] Shortness Of Breath  . Demerol [Meperidine]  Other (See Comments)    Unknown reaction  . Gabapentin Other (See Comments)    Unknown reaction  . Loratadine Other (See Comments)    Unknown reaction  . Meperidine Hcl Other (See Comments)    Unknown reaction  . Mirtazapine Other (See Comments)    Unknown reaction  . Olanzapine Other (See Comments)    Unknown reaction   . Voltaren [Diclofenac Sodium] Shortness Of Breath  . Zetia [Ezetimibe] Other (See Comments)    Weakness in legs, shakiness all over  . Ativan [Lorazepam] Other (See Comments)    Causes double vision at highter than .5 mg dose  . Atorvastatin Other (See Comments)    Muscle aches and weakness  . Budesonide-Formoterol Fumarate Other (See Comments)    Shakiness, tremors  . Bupropion Hcl Other (See Comments)    "cloud over me" depression  . Caffeine Other (See Comments)  jitters  . Codeine Sulfate Other (See Comments)    Makes chest hurt like a heart attack  . Lisinopril Cough  . Metformin Nausea And Vomiting  . Mometasone Furoate Nausea And Vomiting  . Morphine Sulfate Other (See Comments)    Chest pain like a heart attack  . Other Other (See Comments)    Beta Blockers, reaction shortness of breath  . Oxycodone-Acetaminophen Nausea And Vomiting  . Pioglitazone Other (See Comments)    Cannot take because of risk of bladder cancer  . Propoxyphene N-Acetaminophen Nausea And Vomiting  . Rosuvastatin Other (See Comments)    Muscle aches and weakness  . Shellfish Allergy Diarrhea  . Suvorexant Other (See Comments)    Jerking/nervous   . Ticagrelor     Other reaction(s): Other (See Comments) "slowed heart rate" & chest pain  . Tramadol Nausea Only  . Trazodone And Nefazodone Nausea And Vomiting  . Venlafaxine Other (See Comments)    Unknown reaction  . Zolpidem Tartrate Other (See Comments)     Jittery, diarrhea  . Latex Rash    Family History  Problem Relation Age of Onset  . Breast cancer Mother 67  . Hypertension Father   . Mesothelioma Father    . Asthma Father   . Stroke Paternal Grandfather   . Heart disease Other   . Breast cancer Maternal Aunt   . Breast cancer Paternal Aunt     Prior to Admission medications   Medication Sig Start Date End Date Taking? Authorizing Provider  albuterol (PROVENTIL) (2.5 MG/3ML) 0.083% nebulizer solution Take 2.5 mg by nebulization every 4 (four) hours as needed. 06/07/20   [provider]  ARIPiprazole (ABILIFY) 2 MG tablet Take 2 mg by mouth daily.    [provider]  azithromycin (ZITHROMAX Z-PAK) 250 MG tablet Take 2 tablets (500 mg) on  Day 1,  followed by 1 tablet (250 mg) once daily on Days 2 through 5. Patient not taking: Reported on 07/19/2020 06/04/20   Carrie Mew, MD  budesonide (PULMICORT) 0.5 MG/2ML nebulizer solution Take 0.5 mg by nebulization daily.    [provider]  Calcium-Vitamin D (CALTRATE 600 PLUS-VIT D PO) Take 1 tablet by mouth 2 (two) times daily.    [provider]  citalopram (CELEXA) 40 MG tablet Take 40 mg by mouth every morning. 03/30/19   [provider]  DULoxetine (CYMBALTA) 20 MG capsule TAKE 1 CAPSULE BY MOUTH ONCE DAILY FOR 7 DAYS AND THEN INCREASE TO 2 CAPSULES DAILY WITH BREAKFAST 05/09/19   [provider]  furosemide (LASIX) 20 MG tablet Take 20 mg by mouth daily as needed for fluid or edema.     [provider]  glipiZIDE (GLUCOTROL XL) 10 MG 24 hr tablet Take 10 mg by mouth daily. 07/19/20   [provider]  insulin NPH-regular Human (NOVOLIN 70/30) (70-30) 100 UNIT/ML injection Inject 70 Units into the skin 2 (two) times daily with a meal. Inject 70u under the skin every morning at breakfast and inject 70u under the skin every evening with supper    [provider]  levothyroxine (SYNTHROID, LEVOTHROID) 125 MCG tablet Take 125 mcg by mouth daily.  04/03/17   [provider]  metFORMIN (GLUCOPHAGE) 1000 MG tablet Take 1,000 mg by mouth 2 (two) times daily with a  meal. Patient not taking: Reported on 07/19/2020    [provider]  nitroGLYCERIN (NITROSTAT) 0.4 MG SL tablet Place 1 tablet (0.4 mg total) under the tongue every  5 (five) minutes as needed for chest pain. Please make overdue appt with Dr. Angelena Form before anymore refills. 1st attempt Patient not taking: Reported on 07/19/2020 09/30/19   [name], MD  ondansetron (ZOFRAN ODT) 4 MG disintegrating tablet Take 1 tablet (4 mg total) by mouth every 8 (eight) hours as needed for nausea or vomiting. 06/04/20   Carrie Mew, MD  pantoprazole (PROTONIX) 20 MG tablet Take 20 mg by mouth daily. 01/23/17   [provider]  predniSONE (DELTASONE) 20 MG tablet Take 2 tablets (40 mg total) by mouth daily. Patient not taking: Reported on 07/19/2020 06/04/20   Carrie Mew, MD  promethazine (PHENERGAN) 25 MG tablet Take 25 mg by mouth every 6 (six) hours as needed for nausea or vomiting.    [provider]  ranolazine (RANEXA) 500 MG 12 hr tablet Take 1 tablet (500 mg total) by mouth 2 (two) times daily. Patient not taking: Reported on 07/19/2020 03/05/18   Burtis Junes, NP  traZODone (DESYREL) 100 MG tablet Take 100 mg by mouth at bedtime.    [provider]  triamterene-hydrochlorothiazide (DYAZIDE) 37.5-25 MG capsule Take 1 capsule by mouth every morning. 02/16/20   [provider]    Physical Exam: Vitals:   08/08/20 0830 08/08/20 0900 08/08/20 0933 08/08/20 1130  BP: (!) 167/83 (!) 177/82 (!) 184/75 134/62  Pulse: (!) 54 (!) 59 77 (!) 54  Resp: (!) 21  (!) 30 20  Temp:      TempSrc:      SpO2: 100% 93% 99% 97%  Weight:      Height:         Vitals:   08/08/20 0830 08/08/20 0900 08/08/20 0933 08/08/20 1130  BP: (!) 167/83 (!) 177/82 (!) 184/75 134/62  Pulse: (!) 54 (!) 59 77 (!) 54  Resp: (!) 21  (!) 30 20  Temp:      TempSrc:      SpO2: 100% 93% 99% 97%  Weight:      Height:      Constitutional: NAD, calm, comfortable Eyes:  PERRL, lids and conjunctivae normal ENMT: Mucous membranes are moist. Posterior pharynx clear of any exudate or lesions.Normal dentition.  Neck: normal, supple, no masses, no thyromegaly Respiratory: clear to auscultation bilaterally, no wheezing, no crackles. Normal respiratory effort. No accessory muscle use.  Cardiovascular: Regular rate and rhythm, no murmurs / rubs / gallops. No extremity edema. 2+ pedal pulses. No carotid bruits.  Abdomen: no tenderness, no masses palpated. No hepatosplenomegaly. Bowel sounds positive.  Musculoskeletal: no clubbing / cyanosis. No joint deformity upper and lower extremities. Good ROM, no contractures. Normal muscle tone.  Skin: no rashes, lesions, ulcers. No induration Neurologic: CN 2-12 grossly intact. Sensation intact Strength 5/5 in all 4.  Psychiatric: Normal judgment and insight. Alert and oriented x 3. Normal mood.    Labs on Admission: I have personally reviewed following labs and imaging studies  CBC: Recent Labs  Lab 08/08/20 0720  WBC 7.6  HGB 10.7*  HCT 33.4*  MCV 87.2  PLT 678*   Basic Metabolic Panel: Recent Labs  Lab 08/08/20 0720  NA 137  K 4.3  CL 107  CO2 20*  GLUCOSE 128*  BUN 15  CREATININE 0.88  CALCIUM 8.7*   GFR: Estimated Creatinine Clearance: 61.9 mL/min (by C-G formula based on SCr of 0.88 mg/dL). Liver Function Tests: No results for input(s): AST, ALT, ALKPHOS, BILITOT, PROT, ALBUMIN in the last 168 hours. No results for input(s): LIPASE,  AMYLASE in the last 168 hours. No results for input(s): AMMONIA in the last 168 hours. Coagulation Profile: No results for input(s): INR, PROTIME in the last 168 hours. Cardiac Enzymes: No results for input(s): CKTOTAL, CKMB, CKMBINDEX, TROPONINI in the last 168 hours. BNP (last 3 results) No results for input(s): PROBNP in the last 8760 hours. HbA1C: No results for input(s): HGBA1C in the last 72 hours. CBG: No results for input(s): GLUCAP in the last 168  hours. Lipid Profile: No results for input(s): CHOL, HDL, LDLCALC, TRIG, CHOLHDL, LDLDIRECT in the last 72 hours. Thyroid Function Tests: No results for input(s): TSH, T4TOTAL, FREET4, T3FREE, THYROIDAB in the last 72 hours. Anemia Panel: No results for input(s): VITAMINB12, FOLATE, FERRITIN, TIBC, IRON, RETICCTPCT in the last 72 hours. Urine analysis:    Component Value Date/Time   COLORURINE YELLOW (A) 01/17/2019 1600   APPEARANCEUR CLEAR (A) 01/17/2019 1600   APPEARANCEUR Clear 11/25/2012 1335   LABSPEC 1.015 01/17/2019 1600   LABSPEC 1.017 11/25/2012 1335   PHURINE 5.0 01/17/2019 1600   GLUCOSEU 50 (A) 01/17/2019 1600   GLUCOSEU Negative 11/25/2012 1335   HGBUR NEGATIVE 01/17/2019 1600   HGBUR negative 11/11/2009 0854   BILIRUBINUR NEGATIVE 01/17/2019 1600   BILIRUBINUR Negative 11/25/2012 1335   KETONESUR NEGATIVE 01/17/2019 1600   PROTEINUR NEGATIVE 01/17/2019 1600   UROBILINOGEN 0.2 11/11/2009 0854   NITRITE NEGATIVE 01/17/2019 1600   LEUKOCYTESUR TRACE (A) 01/17/2019 1600   LEUKOCYTESUR Negative 11/25/2012 1335    Radiological Exams on Admission: DG Chest 2 View  Result Date: 08/08/2020 CLINICAL DATA:  Chest pain EXAM: CHEST - 2 VIEW COMPARISON:  11/13/2020 FINDINGS: Generalized reticulation which may be sequela of airspace disease seen in January. A few opacities have the appearance of Kerley line. Enlarged heart size with aortic tortuosity. Prior CABG. IMPRESSION: Interstitial coarsening which may be sequela of recent COVID infection or mild CHF. Electronically Signed   By: Monte Fantasia M.D.   On: 08/08/2020 07:50   CT ANGIO CHEST AORTA W/CM & OR WO/CM  Result Date: 08/08/2020 CLINICAL DATA:  Shortness of breath and chest pain EXAM: CT ANGIOGRAPHY CHEST WITHOUT AND WITH CONTRAST TECHNIQUE: Multidetector CT imaging of the chest was performed using the standard protocol before and during bolus administration of intravenous contrast. Multiplanar CT image reconstructions  and MIPs were obtained to evaluate the vascular anatomy. CONTRAST:  158mL OMNIPAQUE IOHEXOL 350 MG/ML SOLN COMPARISON:  06/15/2020 FINDINGS: Cardiovascular: Thoracic aorta is normal in caliber without evidence of intramural hematoma or dissection. There is calcified and irregular noncalcified plaque along the arch and descending portion. No evidence of acute pulmonary embolus within the main or lobar branches. Stable heart size. Coronary artery calcification. Aortic valve replacement. Post CABG. Mediastinum/Nodes: Similar size of mildly enlarged lymph node anterior and to the right of the carina. No new enlarged lymph nodes identified. Small hiatal hernia. Lungs/Pleura: Trace pleural effusions. Improvement in patchy opacities since the prior study. Persistent diffusely increased lung attenuation with interlobular septal thickening. No pneumothorax. Upper Abdomen: No acute abnormality. Musculoskeletal: No acute osseous abnormality. Review of the MIP images confirms the above findings. IMPRESSION: No evidence of aortic dissection or saddle pulmonary embolus. Interstitial pulmonary edema and trace pleural effusions. Improvement in patchy consolidative opacities. Electronically Signed   By: Macy Mis M.D.   On: 08/08/2020 10:27    EKG: Independently reviewed.   Assessment/Plan Acute on Chronic diastolic heart failure exacerbation with relative hypoxemia -last echo 2019, ef 55%-60%, grade II diastolic dysfunction  -place on  CHF protocol  - start lasix iv bid , titrate base on symptoms  -echo update in am  -consider cardiology consult based on results  -strict I/o , daily weights   CAD s/p DES/CABG/PCI -last intervention 2017 -current ekg no hyperacute findings  -CE negative x 2  -to be complete will continue to cycle ce -chest pain due to relative hypoxemia and uncontrolled   HTN -based on results consider cardiology consult in am  -resume home cardiac regimen :ranexa,asa,   statin/bb  DMII -check A1c  - place on in house insulin protocol  -hold oral hypoglycemic for now resume once patient stable  -resume 70/30 insulin  -patient with complaint of nausea , noted to beongoing ? Gastroparesis- will give trial of reglan   HTN -Uncontrolled on admission  -hold dyazide while on lasix -cont bb    COPD on home O2 qhs/Chronic hypoxic respiratory failure  -no acute exacerbation  -resume controlled medications   GERD -ppi   IDA -at baseline  -continue to monitor   HLD -diet controlled intolerant to statin  PAD -no active issues   hypothyrodism -continue synthroid  Obesity -nutrition to see  history of COVID -19 pna -s/p  recovery 1/22  Depression /CAD -continue Abilify /celexa  DVT prophylaxis: lovenox  Code Status: Full Family Communication: n/a Disposition Plan: patient  expected to be admitted greater than 2 midnights Consults called:  N/a  Admission status: in patient   Clance Boll MD [facility]   If 7PM-7AM, please contact night-coverage [email or web address] Password [facility]  08/08/2020, 12:54 PM

## 2020-08-08 NOTE — ED Notes (Signed)
Informed RN bed assigned 1622

## 2020-08-08 NOTE — ED Notes (Signed)
Pt ambulatory to restroom, NAD noted.  

## 2020-08-08 NOTE — ED Notes (Signed)
Pt 95% on RA, asking to be placed on o2, wears at night at home. Placed on 2L [place] for comfort

## 2020-08-08 NOTE — ED Notes (Signed)
Pt ambulatory to restroom

## 2020-08-08 NOTE — ED Notes (Signed)
Pt provided phone to contact family

## 2020-08-08 NOTE — ED Notes (Signed)
Dr Ayesha Rumpf to verify insulin orders

## 2020-08-08 NOTE — ED Notes (Signed)
Pt resting at this time.

## 2020-08-08 NOTE — ED Notes (Signed)
Pt requesting dilaudid for headache, states tylenol is not helping. Dr Marcello Moores notified

## 2020-08-08 NOTE — ED Notes (Signed)
Nitro paste taken off, ok per Dr Marcello Moores

## 2020-08-09 ENCOUNTER — Telehealth: Payer: Self-pay | Admitting: *Deleted

## 2020-08-09 DIAGNOSIS — E039 Hypothyroidism, unspecified: Secondary | ICD-10-CM

## 2020-08-09 DIAGNOSIS — R079 Chest pain, unspecified: Secondary | ICD-10-CM

## 2020-08-09 DIAGNOSIS — E1151 Type 2 diabetes mellitus with diabetic peripheral angiopathy without gangrene: Secondary | ICD-10-CM

## 2020-08-09 DIAGNOSIS — I5033 Acute on chronic diastolic (congestive) heart failure: Secondary | ICD-10-CM | POA: Diagnosis not present

## 2020-08-09 DIAGNOSIS — I10 Essential (primary) hypertension: Secondary | ICD-10-CM

## 2020-08-09 DIAGNOSIS — E119 Type 2 diabetes mellitus without complications: Secondary | ICD-10-CM

## 2020-08-09 DIAGNOSIS — I25118 Atherosclerotic heart disease of native coronary artery with other forms of angina pectoris: Secondary | ICD-10-CM

## 2020-08-09 DIAGNOSIS — E785 Hyperlipidemia, unspecified: Secondary | ICD-10-CM

## 2020-08-09 DIAGNOSIS — I359 Nonrheumatic aortic valve disorder, unspecified: Secondary | ICD-10-CM

## 2020-08-09 DIAGNOSIS — I5030 Unspecified diastolic (congestive) heart failure: Secondary | ICD-10-CM

## 2020-08-09 LAB — BASIC METABOLIC PANEL
Anion gap: 7 (ref 5–15)
BUN: 18 mg/dL (ref 8–23)
CO2: 25 mmol/L (ref 22–32)
Calcium: 8.6 mg/dL — ABNORMAL LOW (ref 8.9–10.3)
Chloride: 104 mmol/L (ref 98–111)
Creatinine, Ser: 1.05 mg/dL — ABNORMAL HIGH (ref 0.44–1.00)
GFR, Estimated: 55 mL/min — ABNORMAL LOW (ref >60)
Glucose, Bld: 206 mg/dL — ABNORMAL HIGH (ref 70–99)
Potassium: 4 mmol/L (ref 3.5–5.1)
Sodium: 136 mmol/L (ref 135–145)

## 2020-08-09 LAB — C-REACTIVE PROTEIN: CRP: 2.7 mg/dL — ABNORMAL HIGH (ref <1.0)

## 2020-08-09 LAB — GLUCOSE, CAPILLARY
Glucose-Capillary: 262 mg/dL — ABNORMAL HIGH (ref 70–99)
Glucose-Capillary: 78 mg/dL (ref 70–99)

## 2020-08-09 LAB — SEDIMENTATION RATE: Sed Rate: 62 mm/hr — ABNORMAL HIGH (ref 0–30)

## 2020-08-09 MED ORDER — PROMETHAZINE HCL 25 MG PO TABS
25.0000 mg | ORAL_TABLET | Freq: Four times a day (QID) | ORAL | Status: DC | PRN
  Administered 2020-08-09: 25 mg via ORAL
  Filled 2020-08-09 (×3): qty 1

## 2020-08-09 MED ORDER — ASPIRIN 81 MG PO TBEC: 81.0000 mg | DELAYED_RELEASE_TABLET | Freq: Every day | ORAL | 11 refills | Status: AC

## 2020-08-09 MED ORDER — LOSARTAN POTASSIUM 25 MG PO TABS
25.0000 mg | ORAL_TABLET | Freq: Every day | ORAL | Status: DC
  Administered 2020-08-09: 25 mg via ORAL
  Filled 2020-08-09: qty 1

## 2020-08-09 MED ORDER — LOSARTAN POTASSIUM 25 MG PO TABS: 25.0000 mg | ORAL_TABLET | Freq: Every day | ORAL | 0 refills | Status: DC

## 2020-08-09 MED ORDER — ACETAMINOPHEN 325 MG PO TABS
650.0000 mg | ORAL_TABLET | Freq: Four times a day (QID) | ORAL | Status: DC | PRN
  Administered 2020-08-09: 650 mg via ORAL

## 2020-08-09 MED ORDER — FUROSEMIDE 20 MG PO TABS: 20.0000 mg | ORAL_TABLET | Freq: Every day | ORAL | 0 refills | Status: DC

## 2020-08-09 MED ORDER — FUROSEMIDE 40 MG PO TABS
40.0000 mg | ORAL_TABLET | Freq: Every day | ORAL | Status: DC
Start: 2020-08-10 — End: 2020-08-09

## 2020-08-09 NOTE — Plan of Care (Signed)
Problem: Education: Goal: Knowledge of General Education information will improve Description: Including pain rating scale, medication(s)/side effects and non-pharmacologic comfort measures 08/09/2020 1550 by Alen Blew, RN Outcome: Adequate for Discharge 08/09/2020 1550 by Alen Blew, RN Outcome: Adequate for Discharge   Problem: Health Behavior/Discharge Planning: Goal: Ability to manage health-related needs will improve 08/09/2020 1550 by Alen Blew, RN Outcome: Adequate for Discharge 08/09/2020 1550 by Alen Blew, RN Outcome: Adequate for Discharge   Problem: Clinical Measurements: Goal: Ability to maintain clinical measurements within normal limits will improve 08/09/2020 1550 by Roanna Epley D, RN Outcome: Adequate for Discharge 08/09/2020 1550 by Alen Blew, RN Outcome: Adequate for Discharge Goal: Will remain free from infection 08/09/2020 1550 by Roanna Epley D, RN Outcome: Adequate for Discharge 08/09/2020 1550 by Alen Blew, RN Outcome: Adequate for Discharge Goal: Diagnostic test results will improve 08/09/2020 1550 by Alen Blew, RN Outcome: Adequate for Discharge 08/09/2020 1550 by Alen Blew, RN Outcome: Adequate for Discharge Goal: Respiratory complications will improve 08/09/2020 1550 by Alen Blew, RN Outcome: Adequate for Discharge 08/09/2020 1550 by Alen Blew, RN Outcome: Adequate for Discharge Goal: Cardiovascular complication will be avoided 08/09/2020 1550 by Roanna Epley D, RN Outcome: Adequate for Discharge 08/09/2020 1550 by Alen Blew, RN Outcome: Adequate for Discharge   Problem: Activity: Goal: Risk for activity intolerance will decrease 08/09/2020 1550 by Roanna Epley D, RN Outcome: Adequate for Discharge 08/09/2020 1550 by Alen Blew, RN Outcome: Adequate for Discharge   Problem: Nutrition: Goal: Adequate nutrition will be maintained 08/09/2020 1550 by Alen Blew, RN Outcome:  Adequate for Discharge 08/09/2020 1550 by Alen Blew, RN Outcome: Adequate for Discharge   Problem: Coping: Goal: Level of anxiety will decrease 08/09/2020 1550 by Alen Blew, RN Outcome: Adequate for Discharge 08/09/2020 1550 by Alen Blew, RN Outcome: Adequate for Discharge   Problem: Elimination: Goal: Will not experience complications related to bowel motility 08/09/2020 1550 by Alen Blew, RN Outcome: Adequate for Discharge 08/09/2020 1550 by Alen Blew, RN Outcome: Adequate for Discharge Goal: Will not experience complications related to urinary retention 08/09/2020 1550 by Alen Blew, RN Outcome: Adequate for Discharge 08/09/2020 1550 by Alen Blew, RN Outcome: Adequate for Discharge   Problem: Pain Managment: Goal: General experience of comfort will improve 08/09/2020 1550 by Alen Blew, RN Outcome: Adequate for Discharge 08/09/2020 1550 by Alen Blew, RN Outcome: Adequate for Discharge   Problem: Safety: Goal: Ability to remain free from injury will improve 08/09/2020 1550 by Roanna Epley D, RN Outcome: Adequate for Discharge 08/09/2020 1550 by Alen Blew, RN Outcome: Adequate for Discharge   Problem: Skin Integrity: Goal: Risk for impaired skin integrity will decrease 08/09/2020 1550 by Alen Blew, RN Outcome: Adequate for Discharge 08/09/2020 1550 by Alen Blew, RN Outcome: Adequate for Discharge   Problem: Education: Goal: Ability to demonstrate management of disease process will improve 08/09/2020 1550 by Roanna Epley D, RN Outcome: Adequate for Discharge 08/09/2020 1550 by Alen Blew, RN Outcome: Adequate for Discharge Goal: Ability to verbalize understanding of medication therapies will improve 08/09/2020 1550 by Alen Blew, RN Outcome: Adequate for Discharge 08/09/2020 1550 by Alen Blew, RN Outcome: Adequate for Discharge Goal: Individualized Educational Video(s) 08/09/2020 1550 by  Alen Blew, RN Outcome: Adequate for Discharge 08/09/2020 1550 by Alen Blew, RN Outcome: Adequate for Discharge   Problem: Activity: Goal: Capacity to carry out  activities will improve 08/09/2020 1550 by Alen Blew, RN Outcome: Adequate for Discharge 08/09/2020 1550 by Alen Blew, RN Outcome: Adequate for Discharge   Problem: Cardiac: Goal: Ability to achieve and maintain adequate cardiopulmonary perfusion will improve 08/09/2020 1550 by Alen Blew, RN Outcome: Adequate for Discharge 08/09/2020 1550 by Alen Blew, RN Outcome: Adequate for Discharge

## 2020-08-09 NOTE — Evaluation (Signed)
Occupational Therapy Evaluation Patient Details Name: [name] MRN: [id number] DOB: 1945-06-24 Today's Date: 08/09/2020    History of Present Illness 75 y.o. female with medical history significant of CADs/p DES to RCA 2009,CABG with Aortic valve repair2015, [id number]/[id number] prox Cx, Diastolic heart failure  Grade II,DMII, HTN , GERD,IDA hx of gi bleed, HLD,PAD,hypothyrodism,COPD/asthma, OSA on nocturnal O2 qhs,obesity, history of COVID -19 pna with recover 1/22, Sjogren's, who presents to ED with substernal -left sided chest pain with associated nausea as well as sob. Per patient symptoms started last pm and have been progressive, so patient presented to ed. Patient currently states that her breathing has improved and she no longer has chest tightness. She does however have some nausea, but with zofran was able to eat her meal. Overall feels improved.  Patient denies any fever or chills or abdominal pain , diarrhea or dysuria.   Clinical Impression   Pt reports living at home with disabled husband and family able to assist intermittently with IADL tasks. Pt reports she does not use AD for ambulation at home. Later in session, she endorses only ambulating short household distances and utilizing wheelchair for appointments in the community. Pt demonstrated ability to perform functional transfers, self care tasks , and mobility without use of AD and without physical assist with no LOB. Pt reports feeling close to baseline. OT does educate pt on energy conservation when home with pt verbalizing understanding. Pt with no skilled OT need at this time. OT to SIGN OFF. Thank you for this referral.    Follow Up Recommendations  No OT follow up    Equipment Recommendations  None recommended by OT       Precautions / Restrictions Precautions Precautions: Fall      Mobility Bed Mobility Overal bed mobility: Modified Independent             General bed mobility comments: use of bed rails and HOB  elevated    Transfers Overall transfer level: Modified independent Equipment used: None             General transfer comment: no physical assistance needed    Balance Overall balance assessment: Modified Independent                                         ADL either performed or assessed with clinical judgement   ADL Overall ADL's : At baseline;Modified independent                                             Vision Patient Visual Report: No change from baseline              Pertinent Vitals/Pain Pain Assessment: No/denies pain     Hand Dominance Right   Extremity/Trunk Assessment Upper Extremity Assessment Upper Extremity Assessment: Overall WFL for tasks assessed   Lower Extremity Assessment Lower Extremity Assessment: Overall WFL for tasks assessed       Communication Communication Communication: HOH   Cognition Arousal/Alertness: Awake/alert Behavior During Therapy: WFL for tasks assessed/performed Overall Cognitive Status: Within Functional Limits for tasks assessed  Home Living Family/patient expects to be discharged to:: Private residence Living Arrangements: Spouse/significant other Available Help at Discharge: Family Type of Home: House Home Access: [place]: One level     Bathroom Shower/Tub: Tub only         Home Equipment: Bedside commode;Walker - 2 wheels;Cane - single point;Wheelchair - manual          Prior Functioning/Environment Level of Independence: Independent        Comments: does not use AD at home but reports if going out for appointments she uses wheelchair for energy conservation                 OT Goals(Current goals can be found in the care plan section) Acute Rehab OT Goals Patient Stated Goal: to go home OT Goal Formulation: With patient Time For Goal Achievement:  08/23/20 Potential to Achieve Goals: Good   AM-PAC OT "6 Clicks" Daily Activity     Outcome Measure Help from another person eating meals?: None Help from another person taking care of personal grooming?: None Help from another person toileting, which includes using toliet, bedpan, or urinal?: None Help from another person bathing (including washing, rinsing, drying)?: None Help from another person to put on and taking off regular upper body clothing?: None Help from another person to put on and taking off regular lower body clothing?: None 6 Click Score: 24   End of Session Nurse Communication: Mobility status  Activity Tolerance: Patient tolerated treatment well Patient left: in bed;with call bell/phone within reach                   Time: 0762-2633 OT Time Calculation (min): 19 min Charges:  OT General Charges $OT Visit: 1 Visit OT Evaluation $OT Eval Low Complexity: 1 Low OT Treatments $Self Care/Home Management : 8-22 mins  Darleen Crocker, MS, OTR/L , CBIS ascom [phone]  08/09/20, 10:27 AM

## 2020-08-09 NOTE — Telephone Encounter (Signed)
TCM call  Appt 3/23 Echo 3/17

## 2020-08-09 NOTE — Discharge Summary (Addendum)
Physician Discharge Summary  TUWANA KAPAUN KDX:[id number] DOB: January 15, 1946 DOA: 08/08/2020  PCP: Rusty Aus, MD  Admit date: 08/08/2020 Discharge date: 08/09/2020 Recommendations for Outpatient Follow-up:   Acute/chronic diastolic CHF w/ SOB --no LE edema or rales, feels back to baseline, changed to oral diuretic. Will go home on Lasix 20 mg daily (was not taking regularly at home so I elected 20mg  dose rather than 40mg ). Outpatient echo.    Follow-up Information    [facility] Follow up on 08/17/2020.   Specialty: Cardiology Why: at 9:30am Contact information: [address] [place] [phone]               Discharge Diagnoses: Principal diagnosis is #1 Principal Problem:   Acute on chronic diastolic CHF (congestive heart failure) ([place]) Active Problems:   DM type 2 (diabetes mellitus, type 2) ([place])   Coronary Artery Disease s/p CABG 10/2013   Chest pain   Discharge Condition: improved Disposition: home  Diet recommendation:  Diet Orders (From admission, onward)    Start     Ordered   08/09/20 0000  Diet - low sodium heart healthy        08/09/20 1543   08/09/20 0000  Diet Carb Modified        08/09/20 1543           Filed Weights   08/08/20 0716 08/08/20 1802 08/09/20 0423  Weight: 88.5 kg 88.5 kg 88.5 kg    HPI/Hospital Course:   26yow PMH DM presented w/ CP, nausea, diaphoresis. Admitted for acute on chronic diastolic CHF. Condition rapidly improved with diuresis with resolution of symptoms. Patient requested discharge home. Was seen by cardiology with recommendation for echocardiogram but patient desired discharge home prior to study completion. Will follow-up with cardiology as an outpatient for echo and office visit.   Acute/chronic diastolic CHF w/ SOB --troponins negative. CTA negative PE and dissection. Showed interstitial pulmonary edema. EKG SR 1st degree AVB, no  acute changes seen --no LE edema or rales, feels back to baseline, changed to oral diuretic. Will go home on Lasix 20 mg daily (was not taking regularly at home so I elected 20mg  dose rather than 40mg )  Chest pain, acute on chronic. 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 HgbA1c 6.6 --CBG stable --continue insulin, resume oral therapy on discharge  Hyperlipidemia --continue statin  PMH bioprosthetic AVR 2015  Hypothyroidism on levothyroxine. TSH WNL --continue  Depression --continue Abilify   Consults:   Cardiology   Today's assessment: See progress note same day  Discharge Instructions  Discharge Instructions    (HEART FAILURE PATIENTS) Call MD:  Anytime you have any of the following symptoms: 1) 3 pound weight gain in 24 hours or 5 pounds in 1 week 2) shortness of breath, with or without a dry hacking cough 3) swelling in the hands, feet or stomach 4) if you have to sleep on extra pillows at night in order to breathe.   Complete by: As directed    Diet - low sodium heart healthy   Complete by: As directed    Diet Carb Modified   Complete by: As directed    Increase activity slowly   Complete by:  As directed      Allergies as of 08/09/2020      Reactions   Amitriptyline Other (See Comments)   Unknown reaction   Benadryl [diphenhydramine] Shortness Of Breath   Demerol [meperidine] Other (See Comments)   Unknown reaction   Gabapentin Other (See Comments)   Unknown reaction   Loratadine Other (See Comments)   Unknown reaction   Meperidine Hcl Other (See Comments)   Unknown reaction   Mirtazapine Other (See Comments)   Unknown reaction   Olanzapine Other (See Comments)   Unknown reaction   Voltaren [diclofenac Sodium] Shortness Of  Breath   Zetia [ezetimibe] Other (See Comments)   Weakness in legs, shakiness all over   Ativan [lorazepam] Other (See Comments)   Causes double vision at highter than .5 mg dose   Atorvastatin Other (See Comments)   Muscle aches and weakness   Budesonide-formoterol Fumarate Other (See Comments)   Shakiness, tremors   Bupropion Hcl Other (See Comments)   "cloud over me" depression   Caffeine Other (See Comments)   jitters   Codeine Sulfate Other (See Comments)   Makes chest hurt like a heart attack   Lisinopril Cough   Metformin Nausea And Vomiting   Mometasone Furoate Nausea And Vomiting   Morphine Sulfate Other (See Comments)   Chest pain like a heart attack   Other Other (See Comments)   Beta Blockers, reaction shortness of breath   Oxycodone-acetaminophen Nausea And Vomiting   Pioglitazone Other (See Comments)   Cannot take because of risk of bladder cancer   Propoxyphene N-acetaminophen Nausea And Vomiting   Rosuvastatin Other (See Comments)   Muscle aches and weakness   Shellfish Allergy Diarrhea   Suvorexant Other (See Comments)   Jerking/nervous    Ticagrelor    Other reaction(s): Other (See Comments) "slowed heart rate" & chest pain   Tramadol Nausea Only   Trazodone And Nefazodone Nausea And Vomiting   Venlafaxine Other (See Comments)   Unknown reaction   Zolpidem Tartrate Other (See Comments)    Jittery, diarrhea   Latex Rash      Medication List    STOP taking these medications   azithromycin 250 MG tablet Commonly known as: Zithromax Z-Pak   budesonide 0.5 MG/2ML nebulizer solution Commonly known as: PULMICORT   citalopram 40 MG tablet Commonly known as: CELEXA   metFORMIN 1000 MG tablet Commonly known as: GLUCOPHAGE   ondansetron 4 MG disintegrating tablet Commonly known as: Zofran ODT   predniSONE 20 MG tablet Commonly known as: Deltasone   ranolazine 500 MG 12 hr tablet Commonly known as: Ranexa   triamterene-hydrochlorothiazide  37.5-25 MG capsule Commonly known as: DYAZIDE     TAKE these medications   albuterol (2.5 MG/3ML) 0.083% nebulizer solution Commonly known as: PROVENTIL Take 2.5 mg by nebulization every 4 (four) hours as needed.   ARIPiprazole 2 MG tablet Commonly known as: ABILIFY Take 2 mg by mouth daily.   aspirin 81 MG EC tablet Take 1 tablet (81 mg total) by mouth daily. Swallow whole.   bisoprolol 5 MG tablet Commonly known as: ZEBETA Take 5 mg by mouth daily.   CALTRATE 600 PLUS-VIT D PO Take 1 tablet by mouth 2 (two) times daily.   DULoxetine 20 MG capsule Commonly known as: CYMBALTA TAKE 1 CAPSULE BY MOUTH ONCE DAILY FOR 7 DAYS AND THEN INCREASE TO 2 CAPSULES DAILY WITH BREAKFAST   furosemide 20 MG tablet Commonly known as: LASIX Take 1 tablet (20 mg total) by  mouth daily. What changed:   when to take this  reasons to take this   glipiZIDE 10 MG 24 hr tablet Commonly known as: GLUCOTROL XL Take 10 mg by mouth daily.   insulin NPH-regular Human (70-30) 100 UNIT/ML injection Inject 60 Units into the skin 2 (two) times daily with a meal. Inject 70u under the skin every morning at breakfast and inject 70u under the skin every evening with supper   levothyroxine 125 MCG tablet Commonly known as: SYNTHROID Take 125 mcg by mouth daily.   losartan 25 MG tablet Commonly known as: COZAAR Take 1 tablet (25 mg total) by mouth daily.   nitroGLYCERIN 0.4 MG SL tablet Commonly known as: NITROSTAT Place 1 tablet (0.4 mg total) under the tongue every 5 (five) minutes as needed for chest pain. Please make overdue appt with Dr. Angelena Form before anymore refills. 1st attempt   pantoprazole 20 MG tablet Commonly known as: PROTONIX Take 20 mg by mouth daily.   promethazine 25 MG tablet Commonly known as: PHENERGAN Take 25 mg by mouth every 6 (six) hours as needed for nausea or vomiting.   traZODone 100 MG tablet Commonly known as: DESYREL Take 100 mg by mouth at bedtime.       Allergies  Allergen Reactions  . Amitriptyline Other (See Comments)    Unknown reaction  . Benadryl [Diphenhydramine] Shortness Of Breath  . Demerol [Meperidine] Other (See Comments)    Unknown reaction  . Gabapentin Other (See Comments)    Unknown reaction  . Loratadine Other (See Comments)    Unknown reaction  . Meperidine Hcl Other (See Comments)    Unknown reaction  . Mirtazapine Other (See Comments)    Unknown reaction  . Olanzapine Other (See Comments)    Unknown reaction   . Voltaren [Diclofenac Sodium] Shortness Of Breath  . Zetia [Ezetimibe] Other (See Comments)    Weakness in legs, shakiness all over  . Ativan [Lorazepam] Other (See Comments)    Causes double vision at highter than .5 mg dose  . Atorvastatin Other (See Comments)    Muscle aches and weakness  . Budesonide-Formoterol Fumarate Other (See Comments)    Shakiness, tremors  . Bupropion Hcl Other (See Comments)    "cloud over me" depression  . Caffeine Other (See Comments)    jitters  . Codeine Sulfate Other (See Comments)    Makes chest hurt like a heart attack  . Lisinopril Cough  . Metformin Nausea And Vomiting  . Mometasone Furoate Nausea And Vomiting  . Morphine Sulfate Other (See Comments)    Chest pain like a heart attack  . Other Other (See Comments)    Beta Blockers, reaction shortness of breath  . Oxycodone-Acetaminophen Nausea And Vomiting  . Pioglitazone Other (See Comments)    Cannot take because of risk of bladder cancer  . Propoxyphene N-Acetaminophen Nausea And Vomiting  . Rosuvastatin Other (See Comments)    Muscle aches and weakness  . Shellfish Allergy Diarrhea  . Suvorexant Other (See Comments)    Jerking/nervous   . Ticagrelor     Other reaction(s): Other (See Comments) "slowed heart rate" & chest pain  . Tramadol Nausea Only  . Trazodone And Nefazodone Nausea And Vomiting  . Venlafaxine Other (See Comments)    Unknown reaction  . Zolpidem Tartrate Other (See  Comments)     Jittery, diarrhea  . Latex Rash    The results of significant diagnostics from this hospitalization (including imaging, microbiology, ancillary and laboratory) are  listed below for reference.    Significant Diagnostic Studies: DG Chest 2 View  Result Date: 08/08/2020 CLINICAL DATA:  Chest pain EXAM: CHEST - 2 VIEW COMPARISON:  11/13/2020 FINDINGS: Generalized reticulation which may be sequela of airspace disease seen in January. A few opacities have the appearance of Kerley line. Enlarged heart size with aortic tortuosity. Prior CABG. IMPRESSION: Interstitial coarsening which may be sequela of recent COVID infection or mild CHF. Electronically Signed   By: Monte Fantasia M.D.   On: 08/08/2020 07:50   CT ANGIO CHEST AORTA W/CM & OR WO/CM  Result Date: 08/08/2020 CLINICAL DATA:  Shortness of breath and chest pain EXAM: CT ANGIOGRAPHY CHEST WITHOUT AND WITH CONTRAST TECHNIQUE: Multidetector CT imaging of the chest was performed using the standard protocol before and during bolus administration of intravenous contrast. Multiplanar CT image reconstructions and MIPs were obtained to evaluate the vascular anatomy. CONTRAST:  174mL OMNIPAQUE IOHEXOL 350 MG/ML SOLN COMPARISON:  06/15/2020 FINDINGS: Cardiovascular: Thoracic aorta is normal in caliber without evidence of intramural hematoma or dissection. There is calcified and irregular noncalcified plaque along the arch and descending portion. No evidence of acute pulmonary embolus within the main or lobar branches. Stable heart size. Coronary artery calcification. Aortic valve replacement. Post CABG. Mediastinum/Nodes: Similar size of mildly enlarged lymph node anterior and to the right of the carina. No new enlarged lymph nodes identified. Small hiatal hernia. Lungs/Pleura: Trace pleural effusions. Improvement in patchy opacities since the prior study. Persistent diffusely increased lung attenuation with interlobular septal thickening. No  pneumothorax. Upper Abdomen: No acute abnormality. Musculoskeletal: No acute osseous abnormality. Review of the MIP images confirms the above findings. IMPRESSION: No evidence of aortic dissection or saddle pulmonary embolus. Interstitial pulmonary edema and trace pleural effusions. Improvement in patchy consolidative opacities. Electronically Signed   By: Macy Mis M.D.   On: 08/08/2020 10:27    Microbiology: Recent Results (from the past 240 hour(s))  Resp Panel by RT-PCR (Flu A&B, Covid) Nasopharyngeal Swab     Status: None   Collection Time: 08/08/20  8:07 AM   Specimen: Nasopharyngeal Swab; Nasopharyngeal(NP) swabs in vial transport medium  Result Value Ref Range Status   SARS Coronavirus 2 by RT PCR NEGATIVE NEGATIVE Final    Comment: (NOTE) SARS-CoV-2 target nucleic acids are NOT DETECTED.  The SARS-CoV-2 RNA is generally detectable in upper respiratory specimens during the acute phase of infection. The lowest concentration of SARS-CoV-2 viral copies this assay can detect is 138 copies/mL. A negative result does not preclude SARS-Cov-2 infection and should not be used as the sole basis for treatment or other patient management decisions. A negative result may occur with  improper specimen collection/handling, submission of specimen other than nasopharyngeal swab, presence of viral mutation(s) within the areas targeted by this assay, and inadequate number of viral copies(<138 copies/mL). A negative result must be combined with clinical observations, patient history, and epidemiological information. The expected result is Negative.  Fact Sheet for Patients:  EntrepreneurPulse.com.au  Fact Sheet for Healthcare Providers:  IncredibleEmployment.be  This test is no t yet approved or cleared by the [place] FDA and  has been authorized for detection and/or diagnosis of SARS-CoV-2 by FDA under an Emergency Use Authorization (EUA). This EUA  will remain  in effect (meaning this test can be used) for the duration of the COVID-19 declaration under Section 564(b)(1) of the Act, 21 U.S.C.section 360bbb-3(b)(1), unless the authorization is terminated  or revoked sooner.       Influenza A  by PCR NEGATIVE NEGATIVE Final   Influenza B by PCR NEGATIVE NEGATIVE Final    Comment: (NOTE) The Xpert Xpress SARS-CoV-2/FLU/RSV plus assay is intended as an aid in the diagnosis of influenza from Nasopharyngeal swab specimens and should not be used as a sole basis for treatment. Nasal washings and aspirates are unacceptable for Xpert Xpress SARS-CoV-2/FLU/RSV testing.  Fact Sheet for Patients: EntrepreneurPulse.com.au  Fact Sheet for Healthcare Providers: IncredibleEmployment.be  This test is not yet approved or cleared by the [place] FDA and has been authorized for detection and/or diagnosis of SARS-CoV-2 by FDA under an Emergency Use Authorization (EUA). This EUA will remain in effect (meaning this test can be used) for the duration of the COVID-19 declaration under Section 564(b)(1) of the Act, 21 U.S.C. section 360bbb-3(b)(1), unless the authorization is terminated or revoked.  Performed at [facility], [address] [place], [place] [address]      Labs: Basic Metabolic Panel: No results for input(s): NA, K, CL, CO2, GLUCOSE, BUN, CREATININE, CALCIUM, MG, PHOS in the last 168 hours. CBC: No results for input(s): WBC, NEUTROABS, HGB, HCT, MCV, PLT in the last 168 hours. Recent Labs    01/22/20 1444 06/15/20 0957 08/08/20 0728  BNP 102.2* 141.8* 395.1*    CBG: No results for input(s): GLUCAP in the last 168 hours.  Principal Problem:   Acute on chronic diastolic CHF (congestive heart failure) ([facility]) Active Problems:   DM type 2 (diabetes mellitus, type 2) ([place])   Coronary Artery Disease s/p CABG 10/2013   Chest pain   Time coordinating discharge: 35  minutes  Signed:  Murray Hodgkins, MD  [facility]  08/16/2020, 4:29 PM

## 2020-08-09 NOTE — Hospital Course (Addendum)
39yow PMH DM presented w/ CP, nausea, diaphoresis. Admitted for acute on chronic diastolic CHF --f/u Echo  A & P  Acute/chronic diastolic CHF w/ SOB --troponins negative. CTA negative PE and dissection. Showed interstitial pulmonary edema. EKG SR 1st degree AVB, no acute changes seen --no LE edema or rales, feels back to baseline, change to oral diuretic  Chest pain, acute on chronic. 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 HgbA1c 6.6 --CBG stable --continue insulin, when discharged resume oral therapy  Hyperlipidemia --continue statin  PMH bioprosthetic AVR 2015  Hypothyroidism on levothyroxine. TSH WNL --continue  Depression --continue Abilify

## 2020-08-09 NOTE — Telephone Encounter (Signed)
-----   Message from Nelva Bush, MD sent at 08/09/2020  2:32 PM EST ----- Regarding: Hospital f/u Hello,  Pt will be d/c'ed this afternoon.  Could you arrange for an echo to assess acute on chronic HFpEF and AVR in the next week or two and f/u with an APP in ~2 weeks?  Thanks.  Gerald Stabs

## 2020-08-09 NOTE — Progress Notes (Addendum)
PROGRESS NOTE  [name] VPX:[id number] DOB: 1946-02-21 DOA: 08/08/2020 PCP: Rusty Aus, MD  Brief History    20yow PMH DM presented w/ CP, nausea, diaphoresis. Admitted for acute on chronic diastolic CHF --f/u Echo  A & P  Acute/chronic diastolic CHF w/ SOB --troponins negative. CTA negative PE and dissection. Showed interstitial pulmonary edema. EKG SR 1st degree AVB, no acute changes seen --no LE edema or rales, feels back to baseline, change to oral diuretic  Chest pain, acute on chronic. 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 HgbA1c 6.6 --CBG stable --continue insulin, when discharged resume oral therapy  Hyperlipidemia --continue statin  PMH bioprosthetic AVR 2015  Hypothyroidism on levothyroxine. TSH WNL --continue  Depression --continue Abilify    Disposition Plan:  Discussion: CHF and SOB stable. No CP now. will ask for cardiology opinion given recurrent CP and CHF. If deemed stable for outpt follow-up will d/c later today.   Dispo: The patient is from: Home              Anticipated d/c is to: Home              Patient currently is not medically stable to d/c.   Difficult to place patient No  DVT prophylaxis: enoxaparin (LOVENOX) injection 40 mg Start: 08/08/20 2200   Code Status: Full Code Level of care: Med-Surg Family Communication: none  Murray Hodgkins, MD  [facility] Direct contact: see [email or web address] (further directions at bottom of note if needed) 7PM-7AM contact night coverage as at bottom of note 08/09/2020, 10:18 AM  LOS: 1 day   Significant Hospital Events   .    Consults:  . Cardiology    Procedures:   .   Significant Diagnostic Tests:  Marland Kitchen    Micro Data:  .    Antimicrobials:  .   Interval History/Subjective  CC: f/u chest pain  CP resolved SOB resolved, now at baseline Has had intermittent CP for some time Hasn't seen cardiology in some time Eats salt  Objective   Vitals:  Vitals:   08/09/20 0354 08/09/20 0756  BP: 139/62 (!) 135/52  Pulse: 65 66  Resp: 18 17  Temp: 98.8 F (37.1 C) 98.4 F (36.9 C)  SpO2: 93% 96%    Exam:  Constitutional:   . Appears calm and comfortable ENMT:  . Mildly hard of hearing Respiratory:  . CTA bilaterally, no w/r/r.  . Respiratory effort normal. Cardiovascular:  . RRR, 3/6 holosystolic murmur RUSB . No LE extremity edema   Psychiatric:  . Mental status o Mood, affect appropriate  I have personally reviewed the following:   Today's Data  . CBG stable . BMP noted . troponins negative  Scheduled Meds: . ARIPiprazole  2 mg Oral Daily  . aspirin EC  81 mg Oral Daily  . bisoprolol  5 mg Oral Daily  . citalopram  40 mg Oral q morning  . enoxaparin (LOVENOX) injection  40 mg Subcutaneous Q24H  . insulin aspart  0-20 Units Subcutaneous TID WC  . insulin aspart protamine- aspart  70 Units Subcutaneous BID WC  . levothyroxine  125 mcg Oral Daily  . metoCLOPramide  5 mg  Oral TID AC  . pantoprazole  20 mg Oral Daily  . sodium chloride flush  3 mL Intravenous Q12H  . traZODone  100 mg Oral QHS  . umeclidinium bromide  1 puff Inhalation Daily   Continuous Infusions: . sodium chloride      Active Problems:   CHF (congestive heart failure) ([place])   LOS: 1 day   How to contact the [facility] Attending or Consulting provider [place] or covering provider during after hours [place], for this patient?  1. Check the care team in [facility] and look for a) attending/consulting [facility] provider listed and b) the [facility] team listed 2. Log into [email or web address] and use [place] universal password to access. If you do not have the password, please  contact the hospital operator. 3. Locate the [facility] provider you are looking for under [facility] and page to a number that you can be directly reached. 4. If you still have difficulty reaching the provider, please page the [facility] (Director on Call) for the Hospitalists listed on amion for assistance.

## 2020-08-09 NOTE — Consult Note (Signed)
   Heart Failure Nurse Navigator Note  HFpEF 55-60%.  Grade II diastolic dysfunction by echocardiogram from April of 2019.  Echocardiogram results pending.  Comorbidities:  Coronary artery disease and stenting Diabetes Hypertension Hyperlipidemia Obesity Depression/anxiety COPD Obstructive sleep apnea intolerant of CPAP use.   Medications:  Aspirin 81 mg Bisoprolol 5 mg daily   Labs:  Sodium 136, potassium 4.0, chloride 104, CO2 25, BUN 18 creatinine 1.05, troponin 15, BNP 395 Intake 240 cc output not documented Blood pressure 161/73 BMI 31.5 Weight is 88.5 kg   Assessment:  General-she is awake and alert sitting up in bed in no acute distress.  HEENT-pulls are equal, no JVD, edentulous.  Cardiac-heart tones of regular rate and rhythm, +mur  Chest-clear to posterior auscultation  Abdomen-soft round nontender  Musculoskeletal-there is no lower extremity edema noted.  Psych-is pleasant and appropriate, makes good eye contact.   Neurologic-speech clear moves all extremities without difficulty.    Initial meeting with patient today.  Discussed  the importance of daily weights and recording.  Instructed on what to report to physician along with changes in symptoms.  Also discussed meals and the types of foods feeding at home and who takes them.  She states due to her inability to stand prolonged times at the stove she does very little cooking she also states that she gets around her home by wheelchair.  She states that a good share of their meals are brought in by friends.  Stressed the importance of low-sodium diet and not adding salt at the table.  Also stressed the importance of maintaining fluid intake of no more than eight 8 ounce cups in a 24-hour period, use ice chips or hard candy to help with clinching thirst.  Also discussed the medications that she is currently on the reason for taking the water pill along with the beta-blocker.   Discussed the  outpatient heart failure clinic for which she has an appointment on March 16 at 9:30 AM.   She had no further questions at this time.   Pricilla Riffle RN [facility]

## 2020-08-09 NOTE — Consult Note (Addendum)
Cardiology Consultation:   Patient ID: [name] MRN: [id number]; DOB: 07-15-1945  Admit date: 08/08/2020 Date of Consult: 08/09/2020  PCP:  Rusty Aus, MD   [place]  Cardiologist:  [name], MD  Advanced Practice Provider:  No care team member to display Electrophysiologist:  None    Patient Profile:   [name] is a 75 y.o. female with a hx of iron deficiency anemia, hypertension, hyperlipidemia (previously on Repatha), diabetes type 2, COPD, remote tobacco use (quit 2015), CAD status post prior stents placement followed by CABG and AVR in May 2015, Sjogren's, intolerant to statin and beta-blocker, OSA unable to tolerate CPAP who is being seen today for the evaluation of acute heart failure and chest discomfort at the request of Dr. Sarajane Jews.  History of Present Illness:   [name] followed by Dr. Angelena Form for the above cardiac issues.  Patient had CABG 10/12/2013 (L-LAD, S-RCA) was AVR with a bioprosthetic valve (21 mm Bakula magna ease).  Since her surgery patient has had chronic chest pain really impaired exercise capacity.  She has had several admissions since.  She has had multiple med intolerances and allergies.  She is statin intolerant.  She is unable to take a beta-blocker.  She is unable to afford Repatha.  Had catheterization 2017 that showed patent LIMA to LAD and SVG to RCA but progression of stent restenosis in the proximal circumflex which was treated with Cutting Balloon angioplasty.  She had second-degree AV block, saw EP who did not feel bradycardia and heart block were related to dizziness and suggested dyspnea was related to chronotropic incompetence and suggested stress testing.  Stress test 07/2016 with 1 minute 36 seconds of exercise stopped due to dizziness.  Effient was stopped September 2018 due to GI bleeding/anemia.  She was seen July 2019 with complaints of rapid heart rate on exertion.  Her 48-hour cardiac monitor  showed sinus rhythm with bradycardia with rates as low as 52 bpm, rare PACs, rare PVCs.  Echo January 2019 showed LVEF 60 to 65%, AVR working well but gradients across the valve are increased from prior study.  She saw Dr. Angelena Form 09/2017 complaining of chest pain and was recath.  Cath showed severe native three-vessel CAD with known total occlusion of RCA, severe proximal LAD stenosis, continue patency of left circumflex with mild in-stent restenosis.  Continue patency of LIMA to LAD and SVG to RCA.  There is no indication for PCI and medical management was continued.  Guards to her aortic valve, Dr. Burt Knack was unable to cross the aortic valve the Cath Lab.  She left the Cath Lab in stable condition and 2D echo was done to assess AV.  Showed normally functioning aortic valve with no stenosis and no regurgitation, mean gradient 17 mmHg, LVEF normal 55 to 60%.  Medical management was continued.  She was referred to lipid clinic she was started on Ranexa October 2019.  She is followed by pulmonology for severe COPD  She was last seen 04/18/2018 by Dr. Angelena Form.  Reported improved anginal symptoms.  No changes were made.  Patient had an echo done 10/2018 at [facility] that showed normal LVEF, normal RV function, well functioning bioprosthetic aortic valve with mean gradient 16.24mmHg  The patient was seen at [facility] in 12/2018 for worsening SOB and she underwent RHC that showed normal filling pressures without evidence of pulmonary HTN, cardiac index 2.7, PVR 2.3.   The patient diagnosed with COVID-19 06/04/2020.  Chest  x-ray showed vague opacities and she was treated for Covid pneumonia with azithromycin.  Patient was seen back in the ED 06/15/2020 with chest pain and tightness.  Work-up was reassuring and she was sent home.  Patient presented to the ER at [facility] 08/08/2020 for shortness of breath, chest tightness, and nausea. Patient reported SOB started suddenly while at home. Breathing was worse on exertion.  Also had orthopnea. She did not report chest pain, but did not chest tightness with taking a deep breath in. It was not worse on exertion. Nausea also with 1 episode of vomiting. Denies fever, chills, palpitations, pnd, LLE. Patient does not take lasix daily at home, only prn. She does not take daily weights.   In the ED blood pressure was elevated 180/83, pulse 66, respiratory rate 28, afebrile, O2 99% on 3 L oxygen.  Labs showed potassium 4.3, glucose 128, creatinine 0.88, BUN 15, WBC 7.6, hemoglobin 10.7.  BNP 395. HS troponin 15>13.  Chest x-ray showed interstitial coarsening which may be sequela of recent Covid infection or mild CHF.  EKG shows sinus rhythm with first-degree AV block and nonspecific ST/T wave changes.  D-dimer came back elevated at 126.  CTA chest showed no aortic dissection or PE, interstitial pulmonary edema and trace pleural effusions, improvement in patchy consolidative opacities.  Was given Lasix 60 mg IV, Tylenol, aspirin, Zofran in the ER and admitted for further work-up.   Past Medical History:  Diagnosis Date  . 1st degree AV block   . ACE-inhibitor cough   . Allergic rhinitis   . Anemia    iron deficiency anemia  . Anxiety   . Aortic ectasia (HCC)    a. CT abd in 12/2016 incidentally noted aortic atherosclerosis and infrarenal abdominal aortic ectasia measuring as large as 2.7 cm with recommendation to repeat US in 2023.  Marland Kitchen Arthritis   . Asthma   . Cataract   . Chronic depression   . Chronic diastolic CHF (congestive heart failure) ([place])   . Chronic headache   . COPD (chronic obstructive pulmonary disease) ([place])   . Coronary artery disease    a. DES to RCA and mid Cx 2009. b. CABG and bioprosthetic AVR May 2015. c. cutting balloon to prox Cx in 05/2016  . Diabetes mellitus    type 2  . Diverticulitis of colon   . Essential hypertension   . GERD (gastroesophageal reflux disease)   . Hearing loss   . History of blood transfusion 2013  . History of  prosthetic aortic valve replacement   . HOH (hard of hearing)   . Hypercholesterolemia    intolerance of statins and niaspan  . IDA (iron deficiency anemia) 02/03/2019  . Mobitz type 1 second degree AV block   . OSA (obstructive sleep apnea)    mild, intolerant of cpap  . PAD (peripheral artery disease) ([place])    a. atherosclerosis by CT abd 12/2016 in [place].  Marland Kitchen PONV (postoperative nausea and vomiting)   . Statin intolerance   . Thyroid disease     Past Surgical History:  Procedure Laterality Date  . ABDOMINAL HYSTERECTOMY    . ABDOMINAL HYSTERECTOMY W/ PARTIAL VAGINACTOMY    . AORTIC VALVE REPLACEMENT N/A 10/12/2013   Procedure: AORTIC VALVE REPLACEMENT (AVR);  Surgeon: [name], MD;  Location: [place];  Service: Open Heart Surgery;  Laterality: N/A;  . [place]  . BARTHOLIN GLAND CYST EXCISION    . BLADDER SUSPENSION    . BREAST BIOPSY  Bilateral 09/11/2000   neg  . BREAST BIOPSY Left 07/24/2010   neg  . BREAST CYST EXCISION  1988   bilateral nonmalignant tumors, x3  . CARDIAC CATHETERIZATION    . CARDIAC CATHETERIZATION N/A 05/25/2016   Procedure: Coronary Balloon Angioplasty;  Surgeon: Leonie Man, MD;  Location: [place] CV LAB;  Service: Cardiovascular;  Laterality: N/A;  . CARDIAC CATHETERIZATION N/A 05/25/2016   Procedure: Coronary/Graft Angiography;  Surgeon: Leonie Man, MD;  Location: [place] CV LAB;  Service: Cardiovascular;  Laterality: N/A;  . CATARACT EXTRACTION W/ INTRAOCULAR LENS  IMPLANT, BILATERAL    . CHOLECYSTECTOMY  2001  . COLECTOMY     lap sigmoid  . COLONOSCOPY  2014   polyps found, 2 clamped off.  . CORONARY ANGIOPLASTY  10/29/2007   Prox RCA & Mid Cx.  . CORONARY ARTERY BYPASS GRAFT N/A 10/12/2013   Procedure: CORONARY ARTERY BYPASS GRAFT TIMES TWO;  Surgeon: [name], MD;  Location: [place];  Service: Open Heart Surgery;  Laterality: N/A;  . CORONARY/GRAFT ANGIOGRAPHY N/A 09/20/2017   Procedure: CORONARY/GRAFT ANGIOGRAPHY;   Surgeon: Sherren Mocha, MD;  Location: [place] CV LAB;  Service: Cardiovascular;  Laterality: N/A;  . LEFT HEART CATHETERIZATION WITH CORONARY ANGIOGRAM N/A 10/09/2013   Procedure: LEFT HEART CATHETERIZATION WITH CORONARY ANGIOGRAM;  Surgeon: [name], MD;  Location: [facility] CATH LAB;  Service: Cardiovascular;  Laterality: N/A;  . STERNAL WIRES REMOVAL N/A 04/13/2014   Procedure: STERNAL WIRES REMOVAL;  Surgeon: [name], MD;  Location: [facility] OR;  Service: Thoracic;  Laterality: N/A;  . THYROIDECTOMY    . TONSILLECTOMY    . TUBAL LIGATION    . VAGINAL DELIVERY     3  . VISCERAL ARTERY INTERVENTION N/A 08/16/2016   Procedure: Visceral Artery Intervention;  Surgeon: Algernon Huxley, MD;  Location: [place] CV LAB;  Service: Cardiovascular;  Laterality: N/A;     Home Medications:  Prior to Admission medications   Medication Sig Start Date Lemmie Vanlanen Date Taking? Authorizing Provider  albuterol (PROVENTIL) (2.5 MG/3ML) 0.083% nebulizer solution Take 2.5 mg by nebulization every 4 (four) hours as needed. 06/07/20  Yes [provider]  ARIPiprazole (ABILIFY) 2 MG tablet Take 2 mg by mouth daily.   Yes [provider]  bisoprolol (ZEBETA) 5 MG tablet Take 5 mg by mouth daily. 07/19/20  Yes [provider]  Calcium-Vitamin D (CALTRATE 600 PLUS-VIT D PO) Take 1 tablet by mouth 2 (two) times daily.   Yes [provider]  citalopram (CELEXA) 40 MG tablet Take 40 mg by mouth every morning. 03/30/19  Yes [provider]  DULoxetine (CYMBALTA) 20 MG capsule TAKE 1 CAPSULE BY MOUTH ONCE DAILY FOR 7 DAYS AND THEN INCREASE TO 2 CAPSULES DAILY WITH BREAKFAST 05/09/19  Yes [provider]  furosemide (LASIX) 20 MG tablet Take 20 mg by mouth daily as needed for fluid or edema.    Yes [provider]  glipiZIDE (GLUCOTROL XL) 10 MG 24 hr tablet Take 10 mg by mouth daily. 07/19/20  Yes [provider]  levothyroxine (SYNTHROID,  LEVOTHROID) 125 MCG tablet Take 125 mcg by mouth daily.  04/03/17  Yes [provider]  ondansetron (ZOFRAN ODT) 4 MG disintegrating tablet Take 1 tablet (4 mg total) by mouth every 8 (eight) hours as needed for nausea or vomiting. 06/04/20  Yes Carrie Mew, MD  pantoprazole (PROTONIX) 20 MG tablet Take 20 mg by mouth daily. 01/23/17  Yes [provider]  promethazine (PHENERGAN) 25 MG tablet Take 25 mg by mouth every 6 (six) hours as needed for nausea or vomiting.   Yes [provider]  traZODone (DESYREL) 100 MG tablet Take 100 mg by mouth at bedtime.   Yes [provider]  triamterene-hydrochlorothiazide (DYAZIDE) 37.5-25 MG capsule Take 1 capsule by mouth every morning. 02/16/20  Yes [provider]  azithromycin (ZITHROMAX Z-PAK) 250 MG tablet Take 2 tablets (500 mg) on  Day 1,  followed by 1 tablet (250 mg) once daily on Days 2 through 5. Patient not taking: Reported on 08/08/2020 06/04/20   Carrie Mew, MD  budesonide (PULMICORT) 0.5 MG/2ML nebulizer solution Take 0.5 mg by nebulization daily. Patient not taking: Reported on 08/08/2020    [provider]  insulin NPH-regular Human (NOVOLIN 70/30) (70-30) 100 UNIT/ML injection Inject 60 Units into the skin 2 (two) times daily with a meal. Inject 70u under the skin every morning at breakfast and inject 70u under the skin every evening with supper    [provider]  metFORMIN (GLUCOPHAGE) 1000 MG tablet Take 1,000 mg by mouth 2 (two) times daily with a meal. Patient not taking: Reported on 07/19/2020    [provider]  nitroGLYCERIN (NITROSTAT) 0.4 MG SL tablet Place 1 tablet (0.4 mg total) under the tongue every 5 (five) minutes as needed for chest pain. Please make overdue appt with Dr. Angelena Form before anymore refills. 1st attempt Patient not taking: No sig reported 09/30/19   [name], MD  predniSONE (DELTASONE) 20 MG tablet Take 2 tablets (40 mg total) by  mouth daily. Patient not taking: No sig reported 06/04/20   Carrie Mew, MD  ranolazine (RANEXA) 500 MG 12 hr tablet Take 1 tablet (500 mg total) by mouth 2 (two) times daily. Patient not taking: Reported on 07/19/2020 03/05/18   Burtis Junes, NP    Inpatient Medications: Scheduled Meds: . ARIPiprazole  2 mg Oral Daily  . aspirin EC  81 mg Oral Daily  . bisoprolol  5 mg Oral Daily  . citalopram  40 mg Oral q morning  . enoxaparin (LOVENOX) injection  40 mg Subcutaneous Q24H  . insulin aspart  0-20 Units Subcutaneous TID WC  . insulin aspart protamine- aspart  70 Units Subcutaneous BID WC  . levothyroxine  125 mcg Oral Daily  . metoCLOPramide  5 mg Oral TID AC  . pantoprazole  20 mg Oral Daily  . sodium chloride flush  3 mL Intravenous Q12H  . traZODone  100 mg Oral QHS  . umeclidinium bromide  1 puff Inhalation Daily   Continuous Infusions: . sodium chloride     PRN Meds: sodium chloride, acetaminophen, albuterol, ondansetron (ZOFRAN) IV, sodium chloride flush  Allergies:    Allergies  Allergen Reactions  . Amitriptyline Other (See Comments)    Unknown reaction  . Benadryl [Diphenhydramine] Shortness Of Breath  . Demerol [Meperidine] Other (See Comments)    Unknown reaction  . Gabapentin Other (See Comments)    Unknown reaction  . Loratadine Other (See Comments)    Unknown reaction  . Meperidine Hcl Other (See Comments)    Unknown reaction  . Mirtazapine Other (See Comments)    Unknown reaction  . Olanzapine Other (See Comments)    Unknown reaction   . Voltaren [Diclofenac Sodium] Shortness Of Breath  . Zetia [Ezetimibe] Other (See Comments)    Weakness in legs, shakiness all over  . Ativan [Lorazepam] Other (See Comments)    Causes  double vision at highter than .5 mg dose  . Atorvastatin Other (See Comments)    Muscle aches and weakness  . Budesonide-Formoterol Fumarate Other (See Comments)    Shakiness, tremors  . Bupropion Hcl Other (See Comments)     "cloud over me" depression  . Caffeine Other (See Comments)    jitters  . Codeine Sulfate Other (See Comments)    Makes chest hurt like a heart attack  . Lisinopril Cough  . Metformin Nausea And Vomiting  . Mometasone Furoate Nausea And Vomiting  . Morphine Sulfate Other (See Comments)    Chest pain like a heart attack  . Other Other (See Comments)    Beta Blockers, reaction shortness of breath  . Oxycodone-Acetaminophen Nausea And Vomiting  . Pioglitazone Other (See Comments)    Cannot take because of risk of bladder cancer  . Propoxyphene N-Acetaminophen Nausea And Vomiting  . Rosuvastatin Other (See Comments)    Muscle aches and weakness  . Shellfish Allergy Diarrhea  . Suvorexant Other (See Comments)    Jerking/nervous   . Ticagrelor     Other reaction(s): Other (See Comments) "slowed heart rate" & chest pain  . Tramadol Nausea Only  . Trazodone And Nefazodone Nausea And Vomiting  . Venlafaxine Other (See Comments)    Unknown reaction  . Zolpidem Tartrate Other (See Comments)     Jittery, diarrhea  . Latex Rash    Social History:   Social History   Socioeconomic History  . Marital status: Married    Spouse name: Not on file  . Number of children: 3  . Years of education: Not on file  . Highest education level: Not on file  Occupational History  . Occupation: Retired    Fish farm manager: UNEMPLOYED    Comment: CNA  Tobacco Use  . Smoking status: Former Smoker    Packs/day: 0.50    Years: 30.00    Pack years: 15.00    Types: Cigarettes    Quit date: 10/02/2013    Years since quitting: 6.8  . Smokeless tobacco: Never Used  Vaping Use  . Vaping Use: Never used  Substance and Sexual Activity  . Alcohol use: No  . Drug use: No  . Sexual activity: Not Currently  Other Topics Concern  . Not on file  Social History Narrative   Does not have Living Will   Desires CPR, would not want prolonged life support if futile.   Social Determinants of Health   Financial  Resource Strain: Not on file  Food Insecurity: Not on file  Transportation Needs: Not on file  Physical Activity: Not on file  Stress: Not on file  Social Connections: Not on file  Intimate Partner Violence: Not on file    Family History:    Family History  Problem Relation Age of Onset  . Breast cancer Mother 14  . Hypertension Father   . Mesothelioma Father   . Asthma Father   . Stroke Paternal Grandfather   . Heart disease Other   . Breast cancer Maternal Aunt   . Breast cancer Paternal Aunt      ROS:  Please see the history of present illness.   All other ROS reviewed and negative.     Physical Exam/Data:   Vitals:   08/08/20 2026 08/09/20 0354 08/09/20 0423 08/09/20 0756  BP: (!) 171/72 139/62  (!) 135/52  Pulse: 75 65  66  Resp: 20 18  17   Temp: 98.7 F (37.1 C) 98.8 F (37.1  C)  98.4 F (36.9 C)  TempSrc: Oral Oral  Oral  SpO2: 93% 93%  96%  Weight:   88.5 kg   Height:        Intake/Output Summary (Last 24 hours) at 08/09/2020 1032 Last data filed at 08/09/2020 0950 Gross per 24 hour  Intake 360 ml  Output --  Net 360 ml   Last 3 Weights 08/09/2020 08/08/2020 08/08/2020  Weight (lbs) 195 lb 3.2 oz 195 lb 1.6 oz 195 lb  Weight (kg) 88.542 kg 88.497 kg 88.451 kg     Body mass index is 31.51 kg/m.  General:  Well nourished, well developed, in no acute distress HEENT: normal Lymph: no adenopathy Neck: no JVD Endocrine:  No thryomegaly Vascular: No carotid bruits; FA pulses 2+ bilaterally without bruits  Cardiac:  normal S1, S2; RRR; + murmur  Lungs:  minimal wheezing Abd: soft, nontender, no hepatomegaly  Ext: Trace edema Musculoskeletal:  No deformities, BUE and BLE strength normal and equal Skin: warm and dry  Neuro:  CNs 2-12 intact, no focal abnormalities noted Psych:  Normal affect   EKG:  The EKG was personally reviewed and demonstrates:  NSr, first degree AV block, 63bpm, nonspecific ST/T wave changes Telemetry:  Telemetry was personally  reviewed and demonstrates:  NSR, HR 60-70s, 1st degree AV block, PVCs  Relevant CV Studies:  Echo ordered  Echo 09/2017 -------------------------------------------------------------------  Study Conclusions   - Procedure narrative: Transthoracic echocardiography. Image  quality was adequate. The study was technically difficult.  - Left ventricle: The cavity size was normal. There was mild  concentric hypertrophy. Systolic function was normal. The  estimated ejection fraction was in the range of 55% to 60%. Wall  motion was normal; there were no regional wall motion  abnormalities. Features are consistent with a pseudonormal left  ventricular filling pattern, with concomitant abnormal relaxation  and increased filling pressure (grade 2 diastolic dysfunction).  - Ventricular septum: Septal motion showed paradox. These changes  are consistent with a post-thoracotomy state.  - Aortic valve: A bioprosthesis was present and functioning  normally. Valve area (VTI): 1.98 cm^2. Valve area (Vmax): 1.7  cm^2. Valve area (Vmean): 1.77 cm^2.  - Mitral valve: Calcified annulus. Valve area by pressure  half-time: 1.56 cm^2.  - Left atrium: The atrium was mildly dilated.   Cardiac cath 2019 1. Severe native 3 vessel CAD with known total occlusion of the RCA, severe proximal LAD stenosis, and continued patency of the LCx with mild in-stent restenosis.  2. 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 Continued patency of the LIMA-LAD and SVG-RCA grafts  Recommend:  Medical management of CAD  Echo to reassess bioprosthetic aortic valve function   Echo 10/2018  Ref Range & Units 1 yr ago  LV Ejection Fraction (%)  55   Aortic Valve Stenosis Grade  none   Aortic Valve Regurgitation Grade  trivial   Aortic Valve Max Velocity (m/s) m/sec 2.8   Aortic Valve Stenosis Mean Gradient (mmHg) mmHg  16.7   Mitral Valve Stenosis Grade  none   Mitral Valve Regurgitation Grade  trivial   Mitral Valve Stenosis Mean Gradient (mmHg) mmHg 5.8   Tricuspid Valve Regurgitation Grade  mild   Tricuspid Valve Regurgitation Max Velocity (m/s) m/sec 2.9   Right Ventricle Systolic Pressure (mmHg) mmHg 28.4   LV Khyri Hinzman Diastolic Diameter (cm) cm 3.7   LV Runette Scifres Systolic Diameter (cm) cm  2.5   LV Septum Wall Thickness (cm) cm 1.2   LV Posterior Wall Thickness (cm) cm 0.88   Left Atrium Diameter (cm) cm 5.6    ECHOCARDIOGRAPHIC MEASUREMENTS  2D DIMENSIONS  AORTA         Values  Normal Range  MAIN PA     Values  Normal Range        Annulus: nm*     [2.1-2.5]     PA Main: nm*    [1.5-2.1]       Aorta Sin: nm*     [2.7-3.3]  RIGHT VENTRICLE      ST Junction: nm*     [2.3-2.9]     RV Base: 5.4 cm  [< 4.2]       Asc.Aorta: nm*     [2.3-3.1]     RV Mid: nm*    [<3.5]  LEFT VENTRICLE                   RV Length: nm*    [<8.6]         LVIDd: 3.7 cm    [3.9-5.3]  INFERIOR VENA CAVA         LVIDs: 2.5 cm            Max. IVC: nm*    [<=2.1]           FS: 31.9 %    [>25]      Min. IVC: nm*          SWT: 1.2 cm    [0.5-0.9]  ------------------          PWT: 0.88 cm   [0.5-0.9]  nm* - not measured  LEFT ATRIUM        LA Diam: 5.6 cm    [2.7-3.8]      LA A4C Area: nm*     [<20]       LA Volume: nm*     [22-52]  _________________________________________________________________________________________  ECHOCARDIOGRAPHIC DESCRIPTIONS  AORTIC ROOT          Size: Normal       Dissection: INDETERM FOR DISSECTION  AORTIC VALVE        Leaflets: PROSTHETIC         Morphology: MILDLY THICKENED        Mobility: Fully mobile            Mobile density  noted in the             LVOT  LEFT VENTRICLE          Size: Normal            Anterior: Normal      Contraction: Normal             Lateral: Normal       Closest EF: >55% (Estimated)        Septal: Normal       LV Masses: No Masses           Apical: Normal          LVH: None             Inferior: Normal                           Posterior: Normal      Dias.FxClass: N/A  MITRAL VALVE        Leaflets: Normal           Mobility: Fully  mobile       Morphology: ANNULAR CALC  LEFT ATRIUM          Size: Normal            LA Masses: No masses       IA Septum: Normal IAS  MAIN PA          Size: Normal  PULMONIC VALVE       Morphology: Normal            Mobility: Fully mobile  RIGHT VENTRICLE       RV Masses: No Masses             Size: Normal       Free Wall: Normal           Contraction: Normal  TRICUSPID VALVE       Leaflets: Normal            Mobility: Fully mobile       Morphology: Normal  RIGHT ATRIUM          Size: Normal            RA Other: None        RA Mass: No masses  PERICARDIUM         Fluid:No effusion  INFERIOR VENACAVA          Size: Normal Normal respiratory collapse    RHC 12/2018 Cardiac Index (l/min/m2) L/min/m2 2.7   Right Atrium Mean Pressure (mmHg) mmHg 7   Right Ventricle Systolic Pressure (mmHg) mmHg 40   Pulmonary Artery Mean Pressure (mmHg) mmHg 25   Pulmonary Wedge Pressure (mmHg) mmHg 13   Pulmonary Vascular Resistance (Wood units) Wood units 2.3    Narrative  Impressions:   #1. Normal filling pressures without evidence of pulmonary hypertension  #2. Cardiac index: 2.7; PVR: 2.3   Recommendations:   #1. Further management  per Dr. Tasia Catchings    Laboratory Data:  High Sensitivity Troponin:   Recent Labs  Lab 08/08/20 0720 08/08/20 0905 08/08/20 1418  TROPONINIHS 15 13 15      Chemistry Recent Labs  Lab 08/08/20 0720 08/09/20 0352  NA 137 136  K 4.3 4.0  CL 107 104  CO2 20* 25  GLUCOSE 128* 206*  BUN 15 18  CREATININE 0.88 1.05*  CALCIUM 8.7* 8.6*  GFRNONAA >60 55*  ANIONGAP 10 7    No results for input(s): PROT, ALBUMIN, AST, ALT, ALKPHOS, BILITOT in the last 168 hours. Hematology Recent Labs  Lab 08/08/20 0720  WBC 7.6  RBC 3.83*  HGB 10.7*  HCT 33.4*  MCV 87.2  MCH 27.9  MCHC 32.0  RDW 15.5  PLT 142*   BNP Recent Labs  Lab 08/08/20 0728  BNP 395.1*    DDimer  Recent Labs  Lab 08/08/20 0807  DDIMER 1.86*     Radiology/Studies:  DG Chest 2 View  Result Date: 08/08/2020 CLINICAL DATA:  Chest pain EXAM: CHEST - 2 VIEW COMPARISON:  11/13/2020 FINDINGS: Generalized reticulation which may be sequela of airspace disease seen in January. A few opacities have the appearance of Kerley line. Enlarged heart size with aortic tortuosity. Prior CABG. IMPRESSION: Interstitial coarsening which may be sequela of recent COVID infection or mild CHF. Electronically Signed   By: Monte Fantasia M.D.   On: 08/08/2020 07:50   CT ANGIO CHEST AORTA W/CM & OR WO/CM  Result Date: 08/08/2020 CLINICAL DATA:  Shortness of breath and chest pain EXAM: CT ANGIOGRAPHY CHEST WITHOUT AND WITH  CONTRAST TECHNIQUE: Multidetector CT imaging of the chest was performed using the standard protocol before and during bolus administration of intravenous contrast. Multiplanar CT image reconstructions and MIPs were obtained to evaluate the vascular anatomy. CONTRAST:  162mL OMNIPAQUE IOHEXOL 350 MG/ML SOLN COMPARISON:  06/15/2020 FINDINGS: Cardiovascular: Thoracic aorta is normal in caliber without evidence of intramural hematoma or dissection. There is calcified and irregular noncalcified plaque along the arch and descending  portion. No evidence of acute pulmonary embolus within the main or lobar branches. Stable heart size. Coronary artery calcification. Aortic valve replacement. Post CABG. Mediastinum/Nodes: Similar size of mildly enlarged lymph node anterior and to the right of the carina. No new enlarged lymph nodes identified. Small hiatal hernia. Lungs/Pleura: Trace pleural effusions. Improvement in patchy opacities since the prior study. Persistent diffusely increased lung attenuation with interlobular septal thickening. No pneumothorax. Upper Abdomen: No acute abnormality. Musculoskeletal: No acute osseous abnormality. Review of the MIP images confirms the above findings. IMPRESSION: No evidence of aortic dissection or saddle pulmonary embolus. Interstitial pulmonary edema and trace pleural effusions. Improvement in patchy consolidative opacities. Electronically Signed   By: Macy Mis M.D.   On: 08/08/2020 10:27     Assessment and Plan:   Chest pain Known CAD history  S/p PCI and subsequent CABG - Most recent LHC 2019 showed severe native three-vessel CAD with known total occlusion of RCA, severe proximal LAD stenosis, continue patency of left circumflex with mild in-stent restenosis.  Continue patency of LIMA to LAD and SVG to RCA.  There is no indication for PCI and medical management was continued.  - On admission main symptom was SOB with nausea. She reports chest discomfort but it sounded pleuritic in nature, more atypical. -  HS troponin  Negative x 3. EKG reassuring - D-dimer elevated, CTA chest negative for PE - BNP elevated and imgaing with CHF, given lasix - Continue home ASA, Ranexa - Echo ordered - I will check sed rate/CRP - Troponin negative. Patient was given  IV lasix for mid CHF. If echo is normal do not suspect we will pursue further ischemic work-up. Md to see.   SOB Acute on chronic diastolic CHF - BNP mildly elevated and imaging with mild CHF - Given lasix 60mg  IV in the ED and  started  IV lasix 40 mg this morning - This morning euvolemic on exam and lasix stopped - Also with bump in creatinine - PTA lasix 20 mg as needed, she was not taking this daily or taking daily weights. - CHF education discussed in detail - Prior RHC [facility] in 12/2018 showed no PH.  - Echo from 10/2018 at [facility] showed normal LV and RV function - Echo ordered as above - Patient reports breathing is back to baseline. She might need lasix 20mg  daily, although has Sjogren's. Also says she has tried lasix and does not like taking it due to frequent trips to the bathroom.   Recent COVID-19 pna COPD - COPD stable - Imaging shows improving opacities  HLD - statin intolerance - was previously referred to lipid clinic for Repatha but patient never follow-up>>can revisit as OP - LDL 154 in 2021  HTN - continue home BB - Improved since admission but still up - Would not increase BB with first degree AV block and HR 60s.  - start Losartan 25mg  daily for better control  Bioprosthetic Valve AVR 2015 - Echo ordered - Echo 2019 showed normally functioning aortic valve with no stenosis and no regurgitation, mean gradient  17 mmHg, LVEF normal 55 to 60%.  - Echo from [place] 10/2018 reported normal functioning AV with mean gradient 16.7mmHg    For questions or updates, please contact [facility] Please consult [email or web address] for contact info under    Signed, Cadence Ninfa Meeker, PA-C  08/09/2020 10:32 AM  I have independently seen and examined the patient and agree with the findings and plan, as documented in the PA/NP's note, with the following additions/changes.  Patient is 75 year old woman previously followed by Dr. Angelena Form in our practice though he has not seen her for several years, whom we have been asked to see due to shortness of breath and chest pressure.  Patient reports a couple of years of exertional dyspnea and chest pressure that seem to have gotten worse yesterday when she was preparing  to come to the hospital for blood work.  She had her daughter take her to the ED instead.  She describes severe difficulty breathing with associated pressure in her chest.  She was noted to be hypertensive and tachypneic on arrival in the ED with reasonable oxygen saturation.  Chest radiograph showed interstitial coarsening thought to reflect sequela of recent COVID-19 infection versus interstitial edema.  D-dimer was elevated, prompting CTA of the chest.  This was negative for large PE and aortic dissection.  Patient was given furosemide, which she does not take regularly at home, with improvement in her dyspnea.  She feels like her breathing is back to baseline.  She is eager for discharge home today and would prefer not to undergo any additional inpatient evaluation.  Physical exam is notable for an obese woman seated in a recliner.  She is not in distress.  Breath sounds are mildly diminished throughout without wheezes or crackles..  Heart sounds are regular with 2/6 systolic murmur.  No rubs or gallops.  Abdomen is soft and nontender.  There is no lower extremity edema or JVD.  EKG shows normal sinus rhythm with first-degree AV block and nonspecific T wave abnormality.  In summary, [name] is a 75 year old woman with history of CAD status post two-vessel CABG as well as prior PCI, bioprosthetic AVR, chronic HFpEF, COPD, and Sjogren's syndrome, whom we have been asked to see due to chest pain and shortness of breath.  Her symptoms are most consistent with acute on chronic HFpEF.  She was noted to have mild to moderate stenosis of her bioprosthetic aortic valve on last echocardiogram over a year ago.  I think it would be reasonable to repeat this, though [name] would prefer to have this done on an outpatient basis.  Her troponin is negative and flat.  BNP was mildly elevated.  We discussed the importance of regular diuretic use, fluid restriction, and avoidance of sodium.  She should be discharged on  furosemide 40 mg p.o. daily.  We will arrange for [name] to have an echocardiogram in our office in the next 1 to 2 weeks and follow-up with me or an APP shortly thereafter.  I think it is reasonable to continue aspirin 81 mg daily and losartan 25 mg daily.  It is okay to continue low-dose bisoprolol with ongoing follow-up of first-degree AV block.  Low threshold for discontinuing this if PR interval lengthens.  Ongoing management of underlying lung disease and recent COVID-19 infection per internal medicine.  Nelva Bush, MD [facility]

## 2020-08-09 NOTE — Progress Notes (Signed)
PT Cancellation Note  Patient Details Name: [name] MRN: [id number] DOB: [birth year]   Cancelled Treatment:    Reason Eval/Treat Not Completed:  (Upon chart review, initial PT order discontinued by attending physician.  Please re-consult should need change throughout remaining hospitalization.)   Kristen H. Owens Shark, PT, DPT, NCS 08/09/20, 11:03 AM [phone]

## 2020-08-09 NOTE — Plan of Care (Signed)
Pt is AAOx4. Pt c/o nausea and trouble sleeping. APP notified. Phenergan and trazodone was given. Pt's VSS. All needs attended. Call light is within reach. Safety measures maintained.     Problem: Education: Goal: Knowledge of General Education information will improve Description: Including pain rating scale, medication(s)/side effects and non-pharmacologic comfort measures Outcome: Progressing   Problem: Health Behavior/Discharge Planning: Goal: Ability to manage health-related needs will improve Outcome: Progressing   Problem: Clinical Measurements: Goal: Ability to maintain clinical measurements within normal limits will improve Outcome: Progressing Goal: Will remain free from infection Outcome: Progressing Goal: Diagnostic test results will improve Outcome: Progressing Goal: Respiratory complications will improve Outcome: Progressing Goal: Cardiovascular complication will be avoided Outcome: Progressing   Problem: Activity: Goal: Risk for activity intolerance will decrease Outcome: Progressing   Problem: Nutrition: Goal: Adequate nutrition will be maintained Outcome: Progressing   Problem: Coping: Goal: Level of anxiety will decrease Outcome: Progressing   Problem: Elimination: Goal: Will not experience complications related to bowel motility Outcome: Progressing Goal: Will not experience complications related to urinary retention Outcome: Progressing   Problem: Pain Managment: Goal: General experience of comfort will improve Outcome: Progressing   Problem: Safety: Goal: Ability to remain free from injury will improve Outcome: Progressing   Problem: Skin Integrity: Goal: Risk for impaired skin integrity will decrease Outcome: Progressing   Problem: Education: Goal: Ability to demonstrate management of disease process will improve Outcome: Progressing Goal: Ability to verbalize understanding of medication therapies will improve Outcome:  Progressing Goal: Individualized Educational Video(s) Outcome: Progressing   Problem: Activity: Goal: Capacity to carry out activities will improve Outcome: Progressing   Problem: Cardiac: Goal: Ability to achieve and maintain adequate cardiopulmonary perfusion will improve Outcome: Progressing

## 2020-08-10 NOTE — Telephone Encounter (Signed)
Patient contacted regarding discharge from [facility] on 08/09/20.  Patient understands to follow up with provider Christell Faith, PA on 08/24/20 at 8:30am at the [facility] office Patient scheduled for an echo at the [place] office on 08/18/20 @ 4pm. Patient understands discharge instructions? yes Patient understands medications and regiment? yes Patient understands to bring all medications to this visit? yes  Patient has not questions or concerns at this time.

## 2020-08-10 NOTE — Telephone Encounter (Signed)
Patient returning call.

## 2020-08-10 NOTE — Telephone Encounter (Signed)
TOC- 1st attempt. lmtcb  

## 2020-08-17 ENCOUNTER — Ambulatory Visit: Payer: Medicare HMO | Admitting: Family

## 2020-08-17 ENCOUNTER — Telehealth: Payer: Self-pay | Admitting: *Deleted

## 2020-08-17 NOTE — Telephone Encounter (Signed)
-----   Message from [place], PA-C sent at 08/17/2020  9:04 AM EDT ----- Regarding: hospital follow-up Patient was seen in the hospital for chest pain, not felt to be cardiac. Also History of valve procedure. Outpatient echo was recommended. Please call and schedule as well as hospital follow-up with APP or End after echo. She used to follow in [place] and would like to follow here.   Thanks!

## 2020-08-18 ENCOUNTER — Other Ambulatory Visit: Payer: Medicare HMO

## 2020-08-18 NOTE — Telephone Encounter (Signed)
Just wanted to follow up on her discharge appointment.

## 2020-08-18 NOTE — Telephone Encounter (Signed)
Per cancellation note patient declined echo and declined fu   Closing this encounter.

## 2020-08-24 ENCOUNTER — Ambulatory Visit: Payer: Medicare HMO | Admitting: Physician Assistant

## 2020-09-07 ENCOUNTER — Observation Stay
Admission: EM | Admit: 2020-09-07 | Discharge: 2020-09-08 | Disposition: A | Discharge status: Home / Self Care | Payer: Medicare HMO | Attending: Internal Medicine | Admitting: Internal Medicine

## 2020-09-07 ENCOUNTER — Encounter: Payer: Self-pay | Admitting: Emergency Medicine

## 2020-09-07 ENCOUNTER — Emergency Department: Payer: Medicare HMO

## 2020-09-07 ENCOUNTER — Other Ambulatory Visit: Payer: Self-pay

## 2020-09-07 DIAGNOSIS — I2510 Atherosclerotic heart disease of native coronary artery without angina pectoris: Secondary | ICD-10-CM | POA: Diagnosis present

## 2020-09-07 DIAGNOSIS — G4733 Obstructive sleep apnea (adult) (pediatric): Secondary | ICD-10-CM | POA: Diagnosis present

## 2020-09-07 DIAGNOSIS — I5033 Acute on chronic diastolic (congestive) heart failure: Secondary | ICD-10-CM | POA: Diagnosis not present

## 2020-09-07 DIAGNOSIS — J45909 Unspecified asthma, uncomplicated: Secondary | ICD-10-CM | POA: Diagnosis not present

## 2020-09-07 DIAGNOSIS — N1831 Chronic kidney disease, stage 3a: Secondary | ICD-10-CM | POA: Insufficient documentation

## 2020-09-07 DIAGNOSIS — E1122 Type 2 diabetes mellitus with diabetic chronic kidney disease: Secondary | ICD-10-CM | POA: Insufficient documentation

## 2020-09-07 DIAGNOSIS — I130 Hypertensive heart and chronic kidney disease with heart failure and stage 1 through stage 4 chronic kidney disease, or unspecified chronic kidney disease: Secondary | ICD-10-CM | POA: Diagnosis not present

## 2020-09-07 DIAGNOSIS — Z87891 Personal history of nicotine dependence: Secondary | ICD-10-CM | POA: Diagnosis not present

## 2020-09-07 DIAGNOSIS — Z20822 Contact with and (suspected) exposure to covid-19: Secondary | ICD-10-CM | POA: Diagnosis not present

## 2020-09-07 DIAGNOSIS — R0602 Shortness of breath: Secondary | ICD-10-CM | POA: Diagnosis present

## 2020-09-07 DIAGNOSIS — E039 Hypothyroidism, unspecified: Secondary | ICD-10-CM | POA: Insufficient documentation

## 2020-09-07 DIAGNOSIS — Z91040 Latex allergy status: Secondary | ICD-10-CM | POA: Insufficient documentation

## 2020-09-07 DIAGNOSIS — R079 Chest pain, unspecified: Secondary | ICD-10-CM | POA: Diagnosis present

## 2020-09-07 DIAGNOSIS — Z951 Presence of aortocoronary bypass graft: Secondary | ICD-10-CM | POA: Diagnosis not present

## 2020-09-07 DIAGNOSIS — Z794 Long term (current) use of insulin: Secondary | ICD-10-CM | POA: Diagnosis not present

## 2020-09-07 DIAGNOSIS — Z952 Presence of prosthetic heart valve: Secondary | ICD-10-CM

## 2020-09-07 DIAGNOSIS — Z7982 Long term (current) use of aspirin: Secondary | ICD-10-CM | POA: Diagnosis not present

## 2020-09-07 DIAGNOSIS — Z79899 Other long term (current) drug therapy: Secondary | ICD-10-CM | POA: Diagnosis not present

## 2020-09-07 DIAGNOSIS — R778 Other specified abnormalities of plasma proteins: Secondary | ICD-10-CM

## 2020-09-07 DIAGNOSIS — J449 Chronic obstructive pulmonary disease, unspecified: Secondary | ICD-10-CM | POA: Diagnosis not present

## 2020-09-07 DIAGNOSIS — R7989 Other specified abnormal findings of blood chemistry: Secondary | ICD-10-CM

## 2020-09-07 DIAGNOSIS — I509 Heart failure, unspecified: Secondary | ICD-10-CM | POA: Insufficient documentation

## 2020-09-07 DIAGNOSIS — E119 Type 2 diabetes mellitus without complications: Secondary | ICD-10-CM

## 2020-09-07 DIAGNOSIS — I10 Essential (primary) hypertension: Secondary | ICD-10-CM | POA: Diagnosis present

## 2020-09-07 LAB — BASIC METABOLIC PANEL
Anion gap: 10 (ref 5–15)
BUN: 19 mg/dL (ref 8–23)
CO2: 21 mmol/L — ABNORMAL LOW (ref 22–32)
Calcium: 8.4 mg/dL — ABNORMAL LOW (ref 8.9–10.3)
Chloride: 103 mmol/L (ref 98–111)
Creatinine, Ser: 1.08 mg/dL — ABNORMAL HIGH (ref 0.44–1.00)
GFR, Estimated: 53 mL/min — ABNORMAL LOW (ref >60)
Glucose, Bld: 334 mg/dL — ABNORMAL HIGH (ref 70–99)
Potassium: 4 mmol/L (ref 3.5–5.1)
Sodium: 134 mmol/L — ABNORMAL LOW (ref 135–145)

## 2020-09-07 LAB — CBC
HCT: 33.1 % — ABNORMAL LOW (ref 36.0–46.0)
Hemoglobin: 10.7 g/dL — ABNORMAL LOW (ref 12.0–15.0)
MCH: 28.3 pg (ref 26.0–34.0)
MCHC: 32.3 g/dL (ref 30.0–36.0)
MCV: 87.6 fL (ref 80.0–100.0)
Platelets: 167 10*3/uL (ref 150–400)
RBC: 3.78 MIL/uL — ABNORMAL LOW (ref 3.87–5.11)
RDW: 14.6 % (ref 11.5–15.5)
WBC: 9.3 10*3/uL (ref 4.0–10.5)
nRBC: 0 % (ref 0.0–0.2)

## 2020-09-07 LAB — BRAIN NATRIURETIC PEPTIDE: B Natriuretic Peptide: 441.6 pg/mL — ABNORMAL HIGH (ref 0.0–100.0)

## 2020-09-07 LAB — TROPONIN I (HIGH SENSITIVITY)
Troponin I (High Sensitivity): 21 ng/L — ABNORMAL HIGH (ref <18)
Troponin I (High Sensitivity): 38 ng/L — ABNORMAL HIGH (ref <18)

## 2020-09-07 MED ORDER — INSULIN ASPART 100 UNIT/ML ~~LOC~~ SOLN
0.0000 [IU] | Freq: Three times a day (TID) | SUBCUTANEOUS | Status: DC
  Administered 2020-09-08: 3 [IU] via SUBCUTANEOUS
  Administered 2020-09-08: 5 [IU] via SUBCUTANEOUS
  Filled 2020-09-07 (×2): qty 1

## 2020-09-07 MED ORDER — SODIUM CHLORIDE 0.9 % IV SOLN: 250.0000 mL | INTRAVENOUS | Status: DC | PRN

## 2020-09-07 MED ORDER — BISOPROLOL FUMARATE 5 MG PO TABS
5.0000 mg | ORAL_TABLET | Freq: Every day | ORAL | Status: DC
  Administered 2020-09-08: 5 mg via ORAL
  Filled 2020-09-07: qty 1

## 2020-09-07 MED ORDER — FUROSEMIDE 10 MG/ML IJ SOLN
40.0000 mg | Freq: Two times a day (BID) | INTRAMUSCULAR | Status: DC
  Administered 2020-09-08: 40 mg via INTRAVENOUS
  Filled 2020-09-07: qty 4

## 2020-09-07 MED ORDER — ALPRAZOLAM 0.25 MG PO TABS
0.2500 mg | ORAL_TABLET | Freq: Two times a day (BID) | ORAL | Status: DC | PRN
  Administered 2020-09-08: 0.25 mg via ORAL
  Filled 2020-09-07: qty 1

## 2020-09-07 MED ORDER — ENOXAPARIN SODIUM 60 MG/0.6ML ~~LOC~~ SOLN
0.5000 mg/kg | SUBCUTANEOUS | Status: DC
  Administered 2020-09-08: 45 mg via SUBCUTANEOUS
  Filled 2020-09-07: qty 0.6

## 2020-09-07 MED ORDER — ONDANSETRON HCL 4 MG/2ML IJ SOLN
4.0000 mg | Freq: Four times a day (QID) | INTRAMUSCULAR | Status: DC | PRN
  Administered 2020-09-08: 4 mg via INTRAVENOUS
  Filled 2020-09-07: qty 2

## 2020-09-07 MED ORDER — NITROGLYCERIN 0.4 MG SL SUBL: 0.4000 mg | SUBLINGUAL_TABLET | SUBLINGUAL | Status: DC | PRN

## 2020-09-07 MED ORDER — INSULIN ASPART 100 UNIT/ML ~~LOC~~ SOLN: 0.0000 [IU] | Freq: Every day | SUBCUTANEOUS | Status: DC

## 2020-09-07 MED ORDER — LOSARTAN POTASSIUM 25 MG PO TABS
25.0000 mg | ORAL_TABLET | Freq: Every day | ORAL | Status: DC
  Administered 2020-09-08: 25 mg via ORAL
  Filled 2020-09-07: qty 1

## 2020-09-07 MED ORDER — FUROSEMIDE 10 MG/ML IJ SOLN
40.0000 mg | Freq: Once | INTRAMUSCULAR | Status: AC
  Administered 2020-09-07: 40 mg via INTRAVENOUS
  Filled 2020-09-07: qty 4

## 2020-09-07 MED ORDER — ACETAMINOPHEN 325 MG PO TABS: 650.0000 mg | ORAL_TABLET | ORAL | Status: DC | PRN

## 2020-09-07 MED ORDER — SODIUM CHLORIDE 0.9% FLUSH: 3.0000 mL | INTRAVENOUS | Status: DC | PRN

## 2020-09-07 MED ORDER — LEVOTHYROXINE SODIUM 25 MCG PO TABS
125.0000 ug | ORAL_TABLET | Freq: Every day | ORAL | Status: DC
  Administered 2020-09-08: 125 ug via ORAL
  Filled 2020-09-07: qty 1

## 2020-09-07 MED ORDER — SODIUM CHLORIDE 0.9% FLUSH
3.0000 mL | Freq: Two times a day (BID) | INTRAVENOUS | Status: DC
  Administered 2020-09-08 (×2): 3 mL via INTRAVENOUS

## 2020-09-07 MED ORDER — ASPIRIN EC 81 MG PO TBEC
81.0000 mg | DELAYED_RELEASE_TABLET | Freq: Every day | ORAL | Status: DC
  Administered 2020-09-08: 81 mg via ORAL
  Filled 2020-09-07: qty 1

## 2020-09-07 MED ORDER — ASPIRIN 81 MG PO CHEW
324.0000 mg | CHEWABLE_TABLET | Freq: Once | ORAL | Status: AC
  Administered 2020-09-07: 324 mg via ORAL
  Filled 2020-09-07: qty 4

## 2020-09-07 NOTE — ED Provider Notes (Signed)
[facility] Emergency Department Provider Note  ____________________________________________   Event Date/Time   First MD Initiated Contact with Patient 09/07/20 2258     (approximate)  I have reviewed the triage vital signs and the nursing notes.   HISTORY  Chief Complaint Shortness of Breath    HPI [name] is a 75 y.o. female with history of grade 2 diastolic dysfunction with a EF of 55 to 60% on echocardiogram in 2019, COPD, hypertension, hyperlipidemia, diabetes, CAD status post stent and CABG, aortic valve replacement who presents to the emergency department with worsening of her chronic shortness of breath over the past day.  Denies any chest pain, fever or cough.  No lower extremity swelling or pain.  States symptoms are worse with exertion but also present at rest.  States that she sleeps in a recliner so she does not notice if there is any orthopnea present.  She denies to me any known history of CHF but this is documented in her chart and it appears she is supposed to be on Lasix 20 mg daily.  It is unclear if she has taken this medication.        Past Medical History:  Diagnosis Date  . 1st degree AV block   . ACE-inhibitor cough   . Allergic rhinitis   . Anemia    iron deficiency anemia  . Anxiety   . Aortic ectasia (HCC)    a. CT abd in 12/2016 incidentally noted aortic atherosclerosis and infrarenal abdominal aortic ectasia measuring as large as 2.7 cm with recommendation to repeat US in 2023.  Marland Kitchen Arthritis   . Asthma   . Cataract   . Chronic depression   . Chronic diastolic CHF (congestive heart failure) ([place])   . Chronic headache   . COPD (chronic obstructive pulmonary disease) ([place])   . Coronary artery disease    a. DES to RCA and mid Cx 2009. b. CABG and bioprosthetic AVR May 2015. c. cutting balloon to prox Cx in 05/2016  . Diabetes mellitus    type 2  . Diverticulitis of colon   . Essential hypertension   . GERD  (gastroesophageal reflux disease)   . Hearing loss   . History of blood transfusion 2013  . History of prosthetic aortic valve replacement   . HOH (hard of hearing)   . Hypercholesterolemia    intolerance of statins and niaspan  . IDA (iron deficiency anemia) 02/03/2019  . Mobitz type 1 second degree AV block   . OSA (obstructive sleep apnea)    mild, intolerant of cpap  . PAD (peripheral artery disease) ([place])    a. atherosclerosis by CT abd 12/2016 in [place].  Marland Kitchen PONV (postoperative nausea and vomiting)   . Statin intolerance   . Thyroid disease     Patient Active Problem List   Diagnosis Date Noted  . Chest pain 08/09/2020  . Acute on chronic diastolic CHF (congestive heart failure) ([place]) 08/08/2020  . Anemia due to stage 3a chronic kidney disease ([place]) 05/21/2019  . IDA (iron deficiency anemia) 02/03/2019  . COPD exacerbation ([place]) 12/28/2017  . Depression, major, single episode, complete remission ([place]) 04/11/2017  . 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 Aortic ectasia, abdominal ([place]) 01/01/2017  . GAD (generalized anxiety disorder) 09/26/2016  . Acute posthemorrhagic anemia 08/24/2016  . Acute respiratory failure with hypoxia ([place]) 08/24/2016  . Acute diastolic CHF (congestive heart failure) ([place]) 08/24/2016  . Hyponatremia 08/24/2016  . Leukocytosis 08/24/2016  .  Fever 08/24/2016  . Diarrhea 08/24/2016  . Generalized weakness 08/24/2016  . Acute diverticulitis 08/16/2016  . Gastrointestinal hemorrhage   . Ataxia 08/09/2016  . Dizziness 08/09/2016  . Staggering gait 08/09/2016  . NSTEMI (non-ST elevated myocardial infarction) ([place])   . AVB (atrioventricular block)   . Atypical chest pain   . Unstable angina ([place]) 05/25/2016  . Palpitations 05/25/2016  . PUD - recent Rx for H.Pylori 05/25/2016  . Stenosis of coronary artery stent   . Recurrent major depressive disorder, in partial remission ([place]) 07/29/2015  . Sialadenitis 04/25/2015  . Viral URI 03/24/2015  .  Hypoglycemia 08/25/2014  . Nausea without vomiting 08/25/2014  . Dyslipidemia-statin ontol   . Long-term insulin use ([place]) 03/31/2014  . Aortic valve disorder 11/11/2013  . 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 Coronary Artery Disease s/p CABG 10/2013 10/10/2013  . Other malaise and fatigue 09/11/2013  . Alopecia 03/06/2013  . Insomnia 02/06/2013  . Gastroesophageal reflux disease without esophagitis 01/19/2013  . Dysphagia, unspecified(787.20) 01/19/2013  . Dyspnea 11/17/2012  . Weakness 11/17/2012  . Abnormal MRI of head 09/05/2012  . Depression with anxiety 03/21/2012  . Extrinsic asthma 11/12/2011  . Anemia 06/07/2011  . Chronic obstructive pulmonary disease ([place]) 01/25/2010  . Acquired hypothyroidism 08/10/2009  . MICROSCOPIC HEMATURIA 05/07/2009  . GOITER 03/02/2009  . Essential hypertension 01/06/2009  . MEMORY LOSS 06/23/2008  . Obstructive sleep apnea 12/25/2007  . DM type 2 (diabetes mellitus, type 2) ([place]) 09/09/2006  . ALLERGIC RHINITIS 09/09/2006  . DIVERTICULOSIS, COLON 09/09/2006    Past Surgical History:  Procedure Laterality Date  . ABDOMINAL HYSTERECTOMY    . ABDOMINAL HYSTERECTOMY W/ PARTIAL VAGINACTOMY    . AORTIC VALVE REPLACEMENT N/A 10/12/2013   Procedure: AORTIC VALVE REPLACEMENT (AVR);  Surgeon: [name], MD;  Location: [place];  Service: Open Heart Surgery;  Laterality: N/A;  . [place]  . BARTHOLIN GLAND CYST EXCISION    . BLADDER SUSPENSION    . BREAST BIOPSY Bilateral 09/11/2000   neg  . BREAST BIOPSY Left 07/24/2010   neg  . BREAST CYST EXCISION  1988   bilateral nonmalignant tumors, x3  . CARDIAC CATHETERIZATION    . CARDIAC CATHETERIZATION N/A 05/25/2016   Procedure: Coronary Balloon Angioplasty;  Surgeon: Leonie Man, MD;  Location: [place] CV LAB;  Service: Cardiovascular;  Laterality: N/A;  . CARDIAC CATHETERIZATION N/A 05/25/2016   Procedure: Coronary/Graft Angiography;  Surgeon: Leonie Man, MD;  Location: [place] [facility];  Service: Cardiovascular;  Laterality: N/A;  . CATARACT EXTRACTION W/ INTRAOCULAR LENS  IMPLANT, BILATERAL    . CHOLECYSTECTOMY  2001  . COLECTOMY     lap sigmoid  . COLONOSCOPY  2014   polyps found, 2 clamped off.  . CORONARY ANGIOPLASTY  10/29/2007   Prox RCA & Mid Cx.  . CORONARY ARTERY BYPASS GRAFT N/A 10/12/2013   Procedure: CORONARY ARTERY BYPASS GRAFT TIMES TWO;  Surgeon: [name], MD;  Location: [place];  Service: Open Heart Surgery;  Laterality: N/A;  . CORONARY/GRAFT ANGIOGRAPHY N/A 09/20/2017   Procedure: CORONARY/GRAFT ANGIOGRAPHY;  Surgeon: Sherren Mocha, MD;  Location: [place] [facility] LAB;  Service: Cardiovascular;  Laterality: N/A;  . LEFT HEART CATHETERIZATION WITH CORONARY ANGIOGRAM N/A 10/09/2013   Procedure: LEFT HEART CATHETERIZATION WITH CORONARY ANGIOGRAM;  Surgeon: [name], MD;  Location: [facility];  Service: Cardiovascular;  Laterality: N/A;  . STERNAL WIRES REMOVAL N/A 04/13/2014   Procedure: STERNAL WIRES REMOVAL;  Surgeon: [name], MD;  Location: [facility] OR;  Service: Thoracic;  Laterality: N/A;  . THYROIDECTOMY    . TONSILLECTOMY    . TUBAL LIGATION    . VAGINAL DELIVERY     3  . VISCERAL ARTERY INTERVENTION N/A 08/16/2016   Procedure: Visceral Artery Intervention;  Surgeon: Algernon Huxley, MD;  Location: [place] [facility] LAB;  Service: Cardiovascular;  Laterality: N/A;    Prior to Admission medications   Medication Sig Start Date End Date Taking? Authorizing Provider  albuterol (PROVENTIL) (2.5 MG/3ML) 0.083% nebulizer solution Take 2.5 mg by nebulization every 4 (four) hours as needed. 06/07/20   [provider]  ARIPiprazole (ABILIFY) 2 MG tablet Take 2 mg by mouth daily.    [provider]  aspirin EC 81 MG EC tablet Take 1 tablet (81 mg total) by mouth daily. Swallow whole. 08/10/20   Samuella Cota, MD  bisoprolol (ZEBETA) 5 MG tablet Take 5 mg by mouth daily. 07/19/20   [provider]   Calcium-Vitamin D (CALTRATE 600 PLUS-VIT D PO) Take 1 tablet by mouth 2 (two) times daily.    [provider]  DULoxetine (CYMBALTA) 20 MG capsule TAKE 1 CAPSULE BY MOUTH ONCE DAILY FOR 7 DAYS AND THEN INCREASE TO 2 CAPSULES DAILY WITH BREAKFAST 05/09/19   [provider]  furosemide (LASIX) 20 MG tablet Take 1 tablet (20 mg total) by mouth daily. 08/10/20   Samuella Cota, MD  glipiZIDE (GLUCOTROL XL) 10 MG 24 hr tablet Take 10 mg by mouth daily. 07/19/20   [provider]  insulin NPH-regular Human (NOVOLIN 70/30) (70-30) 100 UNIT/ML injection Inject 60 Units into the skin 2 (two) times daily with a meal. Inject 70u under the skin every morning at breakfast and inject 70u under the skin every evening with supper    [provider]  levothyroxine (SYNTHROID, LEVOTHROID) 125 MCG tablet Take 125 mcg by mouth daily.  04/03/17   [provider]  losartan (COZAAR) 25 MG tablet Take 1 tablet (25 mg total) by mouth daily. 08/10/20   Samuella Cota, MD  nitroGLYCERIN (NITROSTAT) 0.4 MG SL tablet Place 1 tablet (0.4 mg total) under the tongue every 5 (five) minutes as needed for chest pain. Please make overdue appt with Dr. Angelena Form before anymore refills. 1st attempt Patient not taking: No sig reported 09/30/19   [name], MD  pantoprazole (PROTONIX) 20 MG tablet Take 20 mg by mouth daily. 01/23/17   [provider]  promethazine (PHENERGAN) 25 MG tablet Take 25 mg by mouth every 6 (six) hours as needed for nausea or vomiting.    [provider]  traZODone (DESYREL) 100 MG tablet Take 100 mg by mouth at bedtime.    [provider]    Allergies Amitriptyline, Benadryl [diphenhydramine], Demerol [meperidine], Gabapentin, Loratadine, Meperidine hcl, Mirtazapine, Olanzapine, Voltaren [diclofenac sodium], Zetia [ezetimibe], Ativan [lorazepam], Atorvastatin, Budesonide-formoterol fumarate, Bupropion hcl, Caffeine, Codeine  sulfate, Lisinopril, Metformin, Mometasone furoate, Morphine sulfate, Other, Oxycodone-acetaminophen, Pioglitazone, Propoxyphene n-acetaminophen, Rosuvastatin, Shellfish allergy, Suvorexant, Ticagrelor, Tramadol, Trazodone and nefazodone, Venlafaxine, Zolpidem tartrate, and Latex  Family History  Problem Relation Age of Onset  . Breast cancer Mother 67  . Hypertension Father   . Mesothelioma Father   . Asthma Father   . Stroke Paternal Grandfather   . Heart disease Other   . Breast cancer Maternal Aunt   . Breast cancer Paternal Aunt     Social History Social History   Tobacco Use  .  Smoking status: Former Smoker    Packs/day: 0.50    Years: 30.00    Pack years: 15.00    Types: Cigarettes    Quit date: 10/02/2013    Years since quitting: 6.9  . Smokeless tobacco: Never Used  Vaping Use  . Vaping Use: Never used  Substance Use Topics  . Alcohol use: No  . Drug use: No    Review of Systems Constitutional: No fever. Eyes: No visual changes. ENT: No sore throat. Cardiovascular: Denies chest pain. Respiratory: + shortness of breath. Gastrointestinal: No nausea, vomiting, diarrhea. Genitourinary: Negative for dysuria. Musculoskeletal: Negative for back pain. Skin: Negative for rash. Neurological: Negative for focal weakness or numbness.  ____________________________________________   PHYSICAL EXAM:  VITAL SIGNS: ED Triage Vitals  Enc Vitals Group     BP 09/07/20 1949 (!) 184/102     Pulse Rate 09/07/20 1949 96     Resp 09/07/20 1949 (!) 36     Temp --      Temp src --      SpO2 09/07/20 1949 98 %     Weight 09/07/20 1948 200 lb (90.7 kg)     Height 09/07/20 1948 5\' 6"  (1.676 m)     Head Circumference --      Peak Flow --      Pain Score 09/07/20 1948 10     Pain Loc --      Pain Edu? --      Excl. in [place]? --    CONSTITUTIONAL: Alert and oriented and responds appropriately to questions.  Elderly, appears anxious HEAD: Normocephalic EYES: Conjunctivae clear,  pupils appear equal, EOM appear intact ENT: normal nose; moist mucous membranes NECK: Supple, normal ROM CARD: RRR; S1 and S2 appreciated; no murmurs, no clicks, no rubs, no gallops RESP: Patient is tachypneic and speaking short sentences.  Appears short of breath at rest.  Sats 100% on room air.  She has bibasilar Rales.  No rhonchi or wheezing. ABD/GI: Normal bowel sounds; non-distended; soft, non-tender, no rebound, no guarding, no peritoneal signs, no hepatosplenomegaly BACK: The back appears normal EXT: Normal ROM in all joints; no deformity noted, no edema; no cyanosis, no calf tenderness or calf swelling SKIN: Normal color for age and race; warm; no rash on exposed skin NEURO: Moves all extremities equally PSYCH: The patient's mood and manner are appropriate.  ____________________________________________   LABS (all labs ordered are listed, but only abnormal results are displayed)  Labs Reviewed  BASIC METABOLIC PANEL - Abnormal; Notable for the following components:      Result Value   Sodium 134 (*)    CO2 21 (*)    Glucose, Bld 334 (*)    Creatinine, Ser 1.08 (*)    Calcium 8.4 (*)    GFR, Estimated 53 (*)    All other components within normal limits  CBC - Abnormal; Notable for the following components:   RBC 3.78 (*)    Hemoglobin 10.7 (*)    HCT 33.1 (*)    All other components within normal limits  BRAIN NATRIURETIC PEPTIDE - Abnormal; Notable for the following components:   B Natriuretic Peptide 441.6 (*)    All other components within normal limits  TROPONIN I (HIGH SENSITIVITY) - Abnormal; Notable for the following components:   Troponin I (High Sensitivity) 21 (*)    All other components within normal limits  RESP PANEL BY RT-PCR (FLU A&B, COVID) ARPGX2  TROPONIN I (HIGH SENSITIVITY)   ____________________________________________  EKG  EKG Interpretation  Date/Time:  Wednesday September 07 2020 19:49:17 EDT Ventricular Rate:  92 PR Interval:    QRS  Duration: 102 QT Interval:  390 QTC Calculation: 482 R Axis:   -3 Text Interpretation: Normal sinus rhythm Moderate voltage criteria for LVH, may be normal variant ( R in aVL , Cornell product ) Nonspecific ST and T wave abnormality Abnormal ECG Confirmed by Pryor Curia [phone]) on 09/07/2020 11:04:09 PM       ____________________________________________  RADIOLOGY Jessie Foot F[place]un [place], personally viewed and evaluated these images (plain radiographs) as part of my medical decision making, as well as reviewing the written report by the radiologist.  ED MD interpretation: Acute pulmonary edema  Official radiology report(s): DG Chest 2 View  Result Date: 09/07/2020 CLINICAL DATA:  Chest pain EXAM: CHEST - 2 VIEW COMPARISON:  08/08/2020 FINDINGS: Prior median sternotomy and valve replacement. Cardiomegaly, vascular congestion. Interstitial prominence again noted, slightly increasing since prior study, likely interstitial edema. No effusions. No acute bony abnormality. IMPRESSION: Cardiomegaly with vascular congestion and worsening interstitial prominence, likely edema. Electronically Signed   By: Rolm Baptise M.D.   On: 09/07/2020 20:02    ____________________________________________   PROCEDURES  Procedure(s) performed (including Critical Care):  Procedures  ____________________________________________   INITIAL IMPRESSION / ASSESSMENT AND PLAN / ED COURSE  As part of my medical decision making, I reviewed the following data within the [facility] notes reviewed and incorporated, Labs reviewed , EKG interpreted , Old EKG reviewed, Old chart reviewed, Radiograph reviewed , Discussed with admitting physician  and Notes from prior ED visits         Patient here with shortness of breath worse with exertion.  No chest pain.  No fever.  BNP elevated.  Chest x-ray shows diffuse pulmonary edema.  Has history of grade 2 diastolic dysfunction on echocardiogram in 2019.   Will give IV Lasix.  EKG is nonischemic.  Troponin is 21.  Will discuss with medicine for admission she appears very short of breath at rest but is not hypoxic.  ED PROGRESS  11:41 PM Discussed patient's case with hospitalist, Dr. Damita Dunnings.  I have recommended admission and patient (and family if present) agree with this plan. Admitting physician will place admission orders.   I reviewed all nursing notes, vitals, pertinent previous records and reviewed/interpreted all EKGs, lab and urine results, imaging (as available).   ____________________________________________   FINAL CLINICAL IMPRESSION(S) / ED DIAGNOSES  Final diagnoses:  Acute on chronic diastolic congestive heart failure [facility])     ED Discharge Orders    None      *Please note:  [name] was evaluated in Emergency Department on 09/07/2020 for the symptoms described in the history of present illness. She was evaluated in the context of the global COVID-19 pandemic, which necessitated consideration that the patient might be at risk for infection with the SARS-CoV-2 virus that causes COVID-19. Institutional protocols and algorithms that pertain to the evaluation of patients at risk for COVID-19 are in a state of rapid change based on information released by regulatory bodies including the CDC and federal and state organizations. These policies and algorithms were followed during the patient's care in the ED.  Some ED evaluations and interventions may be delayed as a result of limited staffing during and the pandemic.*   Note:  This document was prepared using Dragon voice recognition software and may include unintentional dictation errors.   Geary Rufo, Delice Bison, DO 09/07/20 2341

## 2020-09-07 NOTE — H&P (Signed)
History and Physical    [name] BJS:[id number] DOB: 08-Apr-1946 DOA: 09/07/2020  PCP: Rusty Aus, MD   Patient coming from: Home  I have personally briefly reviewed patient's old medical records in [place]  Chief Complaint: Shortness of breath  HPI: [name] is a 75 y.o. female with medical history significant for HTN, DM, hypothyroidism, CKD stage llla CAD status post CABG 2015, history of bioprosthetic AVR, insulin-dependent type 2 diabetes, OSA, depression and anxiety and COPD, recently diagnosed diastolic heart failure on a hospitalization from 3/7-3/8(patient deferred echocardiogram), but with last EF of 55% in 2019, who presents with a 1 day history of worsening of her chronic shortness of breath.  She denies chest pain and denies lower extremity pain or swelling.  Denies orthopnea.  Has no cough, fever or chills.  Denies palpitations, lightheadedness, nausea, vomiting or diaphoresis. ED course: On arrival BP 184/102, pulse 96, respirations 36 with O2 sat 98% on room air.  Afebrile.  Blood work significant for troponin 21--38, BNP 441.  Hemoglobin 10.7 which is her baseline and creatinine 1.08 also baseline for CKD. EKG, reviewed and interpreted myself: NSR at 92 with nonspecific ST-T wave changes Imaging: Chest x-ray with cardiomegaly with vascular congestion and worsening interstitial prominence, likely edema  Patient was treated with Lasix in the emergency room.  Hospitalist consulted for admission for CHF exacerbation  Review of Systems: As per HPI otherwise all other systems on review of systems negative.    Past Medical History:  Diagnosis Date  . 1st degree AV block   . ACE-inhibitor cough   . Allergic rhinitis   . Anemia    iron deficiency anemia  . Anxiety   . Aortic ectasia (HCC)    a. CT abd in 12/2016 incidentally noted aortic atherosclerosis and infrarenal abdominal aortic ectasia measuring as large as 2.7 cm with recommendation to repeat US in  2023.  Marland Kitchen Arthritis   . Asthma   . Cataract   . Chronic depression   . Chronic diastolic CHF (congestive heart failure) ([place])   . Chronic headache   . COPD (chronic obstructive pulmonary disease) ([place])   . Coronary artery disease    a. DES to RCA and mid Cx 2009. b. CABG and bioprosthetic AVR May 2015. c. cutting balloon to prox Cx in 05/2016  . Diabetes mellitus    type 2  . Diverticulitis of colon   . Essential hypertension   . GERD (gastroesophageal reflux disease)   . Hearing loss   . History of blood transfusion 2013  . History of prosthetic aortic valve replacement   . HOH (hard of hearing)   . Hypercholesterolemia    intolerance of statins and niaspan  . IDA (iron deficiency anemia) 02/03/2019  . Mobitz type 1 second degree AV block   . OSA (obstructive sleep apnea)    mild, intolerant of cpap  . PAD (peripheral artery disease) ([place])    a. atherosclerosis by CT abd 12/2016 in [place].  Marland Kitchen PONV (postoperative nausea and vomiting)   . Statin intolerance   . Thyroid disease     Past Surgical History:  Procedure Laterality Date  . ABDOMINAL HYSTERECTOMY    . ABDOMINAL HYSTERECTOMY W/ PARTIAL VAGINACTOMY    . AORTIC VALVE REPLACEMENT N/A 10/12/2013   Procedure: AORTIC VALVE REPLACEMENT (AVR);  Surgeon: [name], MD;  Location: [facility];  Service: Open Heart Surgery;  Laterality: N/A;  . [place]  . BARTHOLIN GLAND CYST  EXCISION    . BLADDER SUSPENSION    . BREAST BIOPSY Bilateral 09/11/2000   neg  . BREAST BIOPSY Left 07/24/2010   neg  . BREAST CYST EXCISION  1988   bilateral nonmalignant tumors, x3  . CARDIAC CATHETERIZATION    . CARDIAC CATHETERIZATION N/A 05/25/2016   Procedure: Coronary Balloon Angioplasty;  Surgeon: Leonie Man, MD;  Location: [facility];  Service: Cardiovascular;  Laterality: N/A;  . CARDIAC CATHETERIZATION N/A 05/25/2016   Procedure: Coronary/Graft Angiography;  Surgeon: Leonie Man, MD;  Location: [facility];   Service: Cardiovascular;  Laterality: N/A;  . CATARACT EXTRACTION W/ INTRAOCULAR LENS  IMPLANT, BILATERAL    . CHOLECYSTECTOMY  2001  . COLECTOMY     lap sigmoid  . COLONOSCOPY  2014   polyps found, 2 clamped off.  . CORONARY ANGIOPLASTY  10/29/2007   Prox RCA & Mid Cx.  . CORONARY ARTERY BYPASS GRAFT N/A 10/12/2013   Procedure: CORONARY ARTERY BYPASS GRAFT TIMES TWO;  Surgeon: [name], MD;  Location: [facility];  Service: Open Heart Surgery;  Laterality: N/A;  . CORONARY/GRAFT ANGIOGRAPHY N/A 09/20/2017   Procedure: CORONARY/GRAFT ANGIOGRAPHY;  Surgeon: Sherren Mocha, MD;  Location: [facility];  Service: Cardiovascular;  Laterality: N/A;  . LEFT HEART CATHETERIZATION WITH CORONARY ANGIOGRAM N/A 10/09/2013   Procedure: LEFT HEART CATHETERIZATION WITH CORONARY ANGIOGRAM;  Surgeon: [name], MD;  Location: [facility];  Service: Cardiovascular;  Laterality: N/A;  . STERNAL WIRES REMOVAL N/A 04/13/2014   Procedure: STERNAL WIRES REMOVAL;  Surgeon: [name], MD;  Location: [facility];  Service: Thoracic;  Laterality: N/A;  . THYROIDECTOMY    . TONSILLECTOMY    . TUBAL LIGATION    . VAGINAL DELIVERY     3  . VISCERAL ARTERY INTERVENTION N/A 08/16/2016   Procedure: Visceral Artery Intervention;  Surgeon: Algernon Huxley, MD;  Location: [facility];  Service: Cardiovascular;  Laterality: N/A;     reports that she quit smoking about 6 years ago. Her smoking use included cigarettes. She has a 15.00 pack-year smoking history. She has never used smokeless tobacco. She reports that she does not drink alcohol and does not use drugs.  Allergies  Allergen Reactions  . Amitriptyline Other (See Comments)    Unknown reaction  . Benadryl [Diphenhydramine] Shortness Of Breath  . Demerol [Meperidine] Other (See Comments)    Unknown reaction  . Gabapentin Other (See Comments)    Unknown reaction  . Loratadine Other (See Comments)    Unknown reaction  . Meperidine Hcl Other  (See Comments)    Unknown reaction  . Mirtazapine Other (See Comments)    Unknown reaction  . Olanzapine Other (See Comments)    Unknown reaction   . Voltaren [Diclofenac Sodium] Shortness Of Breath  . Zetia [Ezetimibe] Other (See Comments)    Weakness in legs, shakiness all over  . Ativan [Lorazepam] Other (See Comments)    Causes double vision at highter than .5 mg dose  . Atorvastatin Other (See Comments)    Muscle aches and weakness  . Budesonide-Formoterol Fumarate Other (See Comments)    Shakiness, tremors  . Bupropion Hcl Other (See Comments)    "cloud over me" depression  . Caffeine Other (See Comments)    jitters  . Codeine Sulfate Other (See Comments)    Makes chest hurt like a heart attack  . Lisinopril Cough  . Metformin Nausea And Vomiting  . Mometasone  Furoate Nausea And Vomiting  . Morphine Sulfate Other (See Comments)    Chest pain like a heart attack  . Other Other (See Comments)    Beta Blockers, reaction shortness of breath  . Oxycodone-Acetaminophen Nausea And Vomiting  . Pioglitazone Other (See Comments)    Cannot take because of risk of bladder cancer  . Propoxyphene N-Acetaminophen Nausea And Vomiting  . Rosuvastatin Other (See Comments)    Muscle aches and weakness  . Shellfish Allergy Diarrhea  . Suvorexant Other (See Comments)    Jerking/nervous   . Ticagrelor     Other reaction(s): Other (See Comments) "slowed heart rate" & chest pain  . Tramadol Nausea Only  . Trazodone And Nefazodone Nausea And Vomiting  . Venlafaxine Other (See Comments)    Unknown reaction  . Zolpidem Tartrate Other (See Comments)     Jittery, diarrhea  . Latex Rash    Family History  Problem Relation Age of Onset  . Breast cancer Mother 21  . Hypertension Father   . Mesothelioma Father   . Asthma Father   . Stroke Paternal Grandfather   . Heart disease Other   . Breast cancer Maternal Aunt   . Breast cancer Paternal Aunt       Prior to Admission  medications   Medication Sig Start Date End Date Taking? Authorizing Provider  albuterol (PROVENTIL) (2.5 MG/3ML) 0.083% nebulizer solution Take 2.5 mg by nebulization every 4 (four) hours as needed. 06/07/20   [provider]  ARIPiprazole (ABILIFY) 2 MG tablet Take 2 mg by mouth daily.    [provider]  aspirin EC 81 MG EC tablet Take 1 tablet (81 mg total) by mouth daily. Swallow whole. 08/10/20   Samuella Cota, MD  bisoprolol (ZEBETA) 5 MG tablet Take 5 mg by mouth daily. 07/19/20   [provider]  Calcium-Vitamin D (CALTRATE 600 PLUS-VIT D PO) Take 1 tablet by mouth 2 (two) times daily.    [provider]  DULoxetine (CYMBALTA) 20 MG capsule TAKE 1 CAPSULE BY MOUTH ONCE DAILY FOR 7 DAYS AND THEN INCREASE TO 2 CAPSULES DAILY WITH BREAKFAST 05/09/19   [provider]  furosemide (LASIX) 20 MG tablet Take 1 tablet (20 mg total) by mouth daily. 08/10/20   Samuella Cota, MD  glipiZIDE (GLUCOTROL XL) 10 MG 24 hr tablet Take 10 mg by mouth daily. 07/19/20   [provider]  insulin NPH-regular Human (NOVOLIN 70/30) (70-30) 100 UNIT/ML injection Inject 60 Units into the skin 2 (two) times daily with a meal. Inject 70u under the skin every morning at breakfast and inject 70u under the skin every evening with supper    [provider]  levothyroxine (SYNTHROID, LEVOTHROID) 125 MCG tablet Take 125 mcg by mouth daily.  04/03/17   [provider]  losartan (COZAAR) 25 MG tablet Take 1 tablet (25 mg total) by mouth daily. 08/10/20   Samuella Cota, MD  nitroGLYCERIN (NITROSTAT) 0.4 MG SL tablet Place 1 tablet (0.4 mg total) under the tongue every 5 (five) minutes as needed for chest pain. Please make overdue appt with Dr. Angelena Form before anymore refills. 1st attempt Patient not taking: No sig reported 09/30/19   [name], MD  pantoprazole (PROTONIX) 20 MG tablet Take 20 mg by mouth daily. 01/23/17   [provider]  promethazine (PHENERGAN) 25 MG tablet Take 25 mg by mouth every 6 (six) hours as needed for nausea or vomiting.    [provider]  traZODone (DESYREL) 100 MG tablet Take 100 mg by mouth at bedtime.    [provider]    Physical Exam: Vitals:   09/07/20 2230 09/07/20 2300 09/07/20 2315 09/07/20 2328  BP: (!) 151/88 (!) 173/72 (!) 159/85   Pulse: 71 76 70   Resp: (!) 21 19 (!) 27   Temp:    97.7 F (36.5 C)  TempSrc:    Oral  SpO2: 98% 98% 98%   Weight:      Height:         Vitals:   09/07/20 2230 09/07/20 2300 09/07/20 2315 09/07/20 2328  BP: (!) 151/88 (!) 173/72 (!) 159/85   Pulse: 71 76 70   Resp: (!) 21 19 (!) 27   Temp:    97.7 F (36.5 C)  TempSrc:    Oral  SpO2: 98% 98% 98%   Weight:      Height:          Constitutional: Alert and oriented x 3 .  Mild conversational dyspnea distress HEENT:      Head: Normocephalic and atraumatic.         Eyes: PERLA, EOMI, Conjunctivae are normal. Sclera is non-icteric.       Mouth/Throat: Mucous membranes are moist.       Neck: Supple with no signs of meningismus. Cardiovascular: Regular rate and rhythm. No murmurs, gallops, or rubs. 2+ symmetrical distal pulses are present . No JVD. No LE edema Respiratory: Respiratory effort normal .Lungs sounds with bibasilar rales gastrointestinal: Soft, non tender, and non distended with positive bowel sounds.  Genitourinary: No CVA tenderness. Musculoskeletal: Nontender with normal range of motion in all extremities. No cyanosis, or erythema of extremities. Neurologic:  Face is symmetric. Moving all extremities. No gross focal neurologic deficits . Skin: Skin is warm, dry.  No rash or ulcers Psychiatric: Anxious appearing   Labs on Admission: I have personally reviewed following labs and imaging studies  CBC: Recent Labs  Lab 09/07/20 1953  WBC 9.3  HGB 10.7*  HCT 33.1*  MCV 87.6  PLT 297   Basic Metabolic Panel: Recent Labs  Lab 09/07/20 1953  NA  134*  K 4.0  CL 103  CO2 21*  GLUCOSE 334*  BUN 19  CREATININE 1.08*  CALCIUM 8.4*   GFR: Estimated Creatinine Clearance: 50.3 mL/min (A) (by C-G formula based on SCr of 1.08 mg/dL (H)). Liver Function Tests: No results for input(s): AST, ALT, ALKPHOS, BILITOT, PROT, ALBUMIN in the last 168 hours. No results for input(s): LIPASE, AMYLASE in the last 168 hours. No results for input(s): AMMONIA in the last 168 hours. Coagulation Profile: No results for input(s): INR, PROTIME in the last 168 hours. Cardiac Enzymes: No results for input(s): CKTOTAL, CKMB, CKMBINDEX, TROPONINI in the last 168 hours. BNP (last 3 results) No results for input(s): PROBNP in the last 8760 hours. HbA1C: No results for input(s): HGBA1C in the last 72 hours. CBG: No results for input(s): GLUCAP in the last 168 hours. Lipid Profile: No results for input(s): CHOL, HDL, LDLCALC, TRIG, CHOLHDL, LDLDIRECT in the last 72 hours. Thyroid Function Tests: No results for input(s): TSH, T4TOTAL, FREET4, T3FREE, THYROIDAB in the last 72 hours. Anemia Panel: No results for input(s): VITAMINB12, FOLATE, FERRITIN, TIBC, IRON, RETICCTPCT in the last 72 hours. Urine analysis:    Component Value Date/Time   COLORURINE YELLOW (A) 01/17/2019 1600   APPEARANCEUR CLEAR (A) 01/17/2019 1600   APPEARANCEUR Clear 11/25/2012 1335   LABSPEC 1.015 01/17/2019  1600   LABSPEC 1.017 11/25/2012 1335   PHURINE 5.0 01/17/2019 1600   GLUCOSEU 50 (A) 01/17/2019 1600   GLUCOSEU Negative 11/25/2012 1335   HGBUR NEGATIVE 01/17/2019 1600   HGBUR negative 11/11/2009 0854   BILIRUBINUR NEGATIVE 01/17/2019 1600   BILIRUBINUR Negative 11/25/2012 1335   KETONESUR NEGATIVE 01/17/2019 1600   PROTEINUR NEGATIVE 01/17/2019 1600   UROBILINOGEN 0.2 11/11/2009 0854   NITRITE NEGATIVE 01/17/2019 1600   LEUKOCYTESUR TRACE (A) 01/17/2019 1600   LEUKOCYTESUR Negative 11/25/2012 1335    Radiological Exams on Admission: DG Chest 2 View  Result  Date: 09/07/2020 CLINICAL DATA:  Chest pain EXAM: CHEST - 2 VIEW COMPARISON:  08/08/2020 FINDINGS: Prior median sternotomy and valve replacement. Cardiomegaly, vascular congestion. Interstitial prominence again noted, slightly increasing since prior study, likely interstitial edema. No effusions. No acute bony abnormality. IMPRESSION: Cardiomegaly with vascular congestion and worsening interstitial prominence, likely edema. Electronically Signed   By: Rolm Baptise M.D.   On: 09/07/2020 20:02     Assessment/Plan 75 year old female with history of HTN, DM, hypothyroidism, CKD stage llla CAD status post CABG 2015, history of bioprosthetic AVR, chronic dyspnea of multifactorial etiology, insulin-dependent type 2 diabetes, OSA, depression and anxiety and COPD, recently diagnosed diastolic heart failure on a hospitalization from 3/7-3/8 (patient deferred echocardiogram), but with last EF of 55% in 2019, presenting with a 1 day history of worsening of her chronic shortness of breath.      Acute on chronic diastolic CHF (congestive heart failure) in the setting of multifactorial chronic dyspnea -Patient with chronic dyspnea acutely worsening over the last 24 hours, with elevated BNP 441, up from 141 2 months prior, chest x-ray with cardiomegaly and vascular congestion -Last echocardiogram 2019 with grade 2 diastolic dysfunction and EF 55 to 60% -Patient has chronic dyspnea and has been extensively evaluated.  Had right heart cath on 12/2018 without evidence of pulmonary HTN -IV Lasix.  Continue home metoprolol and ARB -Daily weights.  Intake and output monitoring.  Salt restriction -Echocardiogram in the a.m.    Elevated troponin   Coronary Artery Disease s/p CABG 10/2013 -Troponin elevation of 21/38 without chest pain or acute EKG changes -Likely secondary to supply demand mismatch -Patient had cath 09/2017 that showed severe native three-vessel disease with patency of grafts and there was no indication for  PCI and medical management was continued -Continue home aspirin, beta-blocker patient is statin intolerant    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 -No acute disease suspected    Acquired hypothyroidism -Continue levothyroxine    DM type 2 (diabetes mellitus, type 2) (HCC) -Sliding scale insulin pending med rec    Obstructive sleep apnea -Patient intolerant of CPAP    Essential hypertension -Continue home meds pending med rec    Chronic obstructive pulmonary disease ([place]) -Not acutely exacerbated    DVT prophylaxis: Lovenox  Code Status: full code  Family Communication:  none  Disposition Plan: Back to previous home environment Consults called: none Status:At the time of admission, it appears that the appropriate admission status for this patient is INPATIENT. This is judged to be reasonable and necessary in order to provide the required intensity of service to ensure the patient's safety given the presenting symptoms, physical exam findings, and initial radiographic and laboratory data in the context of their  Comorbid conditions.   Patient requires inpatient status due to high intensity of service, high risk for further deterioration and high frequency of surveillance required.   I certify that at the point of admission  it is my clinical judgment that the patient will require inpatient hospital care spanning beyond [place] [facility]     09/07/2020, 11:48 PM

## 2020-09-07 NOTE — Progress Notes (Signed)
PHARMACIST - PHYSICIAN COMMUNICATION  CONCERNING:  Enoxaparin (Lovenox) for DVT Prophylaxis    RECOMMENDATION: Patient was prescribed enoxaprin 40mg  q24 hours for VTE prophylaxis.   Filed Weights   09/07/20 1948  Weight: 90.7 kg (200 lb)    Body mass index is 32.28 kg/m.  Estimated Creatinine Clearance: 50.3 mL/min (A) (by C-G formula based on SCr of 1.08 mg/dL (H)).   Based on [place] patient is candidate for enoxaparin 0.5mg /kg TBW SQ every 24 hours based on BMI being >30.   DESCRIPTION: Pharmacy has adjusted enoxaparin dose per [facility] policy.  Patient is now receiving enoxaparin 0.5 mg/kg every 24 hours   Renda Rolls, PharmD, [facility] 09/07/2020 11:51 PM

## 2020-09-07 NOTE — ED Triage Notes (Signed)
Pt to ED from home c/o SOB that has been going on for a couple weeks but worse today.  Pt states has non productive cough and central chest pain when breathing.  Pt tachypnic in triage, encouraged to take slow deep breaths.  Pt states had COVID last year and then COVID pneumonia.  Pt A&Ox4, skin warm and dry, lung sounds auscultated, taychypnic.

## 2020-09-08 ENCOUNTER — Encounter: Payer: Self-pay | Admitting: Internal Medicine

## 2020-09-08 ENCOUNTER — Telehealth: Payer: Self-pay | Admitting: Cardiovascular Disease

## 2020-09-08 DIAGNOSIS — I5033 Acute on chronic diastolic (congestive) heart failure: Secondary | ICD-10-CM | POA: Diagnosis not present

## 2020-09-08 LAB — GLUCOSE, CAPILLARY
Glucose-Capillary: 175 mg/dL — ABNORMAL HIGH (ref 70–99)
Glucose-Capillary: 193 mg/dL — ABNORMAL HIGH (ref 70–99)
Glucose-Capillary: 207 mg/dL — ABNORMAL HIGH (ref 70–99)

## 2020-09-08 LAB — BASIC METABOLIC PANEL
Anion gap: 10 (ref 5–15)
BUN: 18 mg/dL (ref 8–23)
CO2: 30 mmol/L (ref 22–32)
Calcium: 8.4 mg/dL — ABNORMAL LOW (ref 8.9–10.3)
Chloride: 99 mmol/L (ref 98–111)
Creatinine, Ser: 0.99 mg/dL (ref 0.44–1.00)
GFR, Estimated: 59 mL/min — ABNORMAL LOW (ref >60)
Glucose, Bld: 170 mg/dL — ABNORMAL HIGH (ref 70–99)
Potassium: 3.4 mmol/L — ABNORMAL LOW (ref 3.5–5.1)
Sodium: 139 mmol/L (ref 135–145)

## 2020-09-08 LAB — RESP PANEL BY RT-PCR (FLU A&B, COVID) ARPGX2
Influenza A by PCR: NEGATIVE
Influenza B by PCR: NEGATIVE
SARS Coronavirus 2 by RT PCR: NEGATIVE

## 2020-09-08 MED ORDER — TRAZODONE HCL 100 MG PO TABS
100.0000 mg | ORAL_TABLET | Freq: Every day | ORAL | Status: DC
  Administered 2020-09-08: 100 mg via ORAL
  Filled 2020-09-08: qty 1

## 2020-09-08 MED ORDER — ASPIRIN 81 MG PO TBEC: 81.0000 mg | DELAYED_RELEASE_TABLET | Freq: Every day | ORAL | Status: DC

## 2020-09-08 MED ORDER — FUROSEMIDE 20 MG PO TABS: 20.0000 mg | ORAL_TABLET | Freq: Every day | ORAL | 4 refills | Status: DC

## 2020-09-08 MED ORDER — DULOXETINE HCL 20 MG PO CPEP
40.0000 mg | ORAL_CAPSULE | Freq: Every day | ORAL | Status: DC
  Administered 2020-09-08: 40 mg via ORAL
  Filled 2020-09-08: qty 2

## 2020-09-08 MED ORDER — INSULIN ASPART PROT & ASPART (70-30 MIX) 100 UNIT/ML ~~LOC~~ SUSP: 60.0000 [IU] | Freq: Two times a day (BID) | SUBCUTANEOUS | Status: DC

## 2020-09-08 MED ORDER — DULOXETINE HCL 20 MG PO CPEP: 40.0000 mg | ORAL_CAPSULE | Freq: Every day | ORAL | 3 refills | Status: DC

## 2020-09-08 MED ORDER — POTASSIUM CHLORIDE CRYS ER 10 MEQ PO TBCR
10.0000 meq | EXTENDED_RELEASE_TABLET | Freq: Every day | ORAL | Status: DC
  Administered 2020-09-08: 10 meq via ORAL
  Filled 2020-09-08: qty 1

## 2020-09-08 MED ORDER — ACETAMINOPHEN 325 MG PO TABS
650.0000 mg | ORAL_TABLET | Freq: Four times a day (QID) | ORAL | Status: DC | PRN
  Administered 2020-09-08 (×2): 650 mg via ORAL
  Filled 2020-09-08 (×2): qty 2

## 2020-09-08 MED ORDER — NITROGLYCERIN 0.4 MG SL SUBL: 0.4000 mg | SUBLINGUAL_TABLET | SUBLINGUAL | 0 refills | Status: DC | PRN

## 2020-09-08 MED ORDER — GLIPIZIDE ER 10 MG PO TB24
10.0000 mg | ORAL_TABLET | Freq: Every day | ORAL | Status: DC
  Filled 2020-09-08: qty 1

## 2020-09-08 MED ORDER — TRAZODONE HCL 50 MG PO TABS: 50.0000 mg | ORAL_TABLET | Freq: Every evening | ORAL | Status: DC | PRN

## 2020-09-08 MED ORDER — POTASSIUM CHLORIDE CRYS ER 10 MEQ PO TBCR: 10.0000 meq | EXTENDED_RELEASE_TABLET | Freq: Every day | ORAL | 3 refills | Status: DC

## 2020-09-08 MED ORDER — ARIPIPRAZOLE 2 MG PO TABS
2.0000 mg | ORAL_TABLET | Freq: Every day | ORAL | Status: DC
  Filled 2020-09-08: qty 1

## 2020-09-08 MED ORDER — PANTOPRAZOLE SODIUM 20 MG PO TBEC
20.0000 mg | DELAYED_RELEASE_TABLET | Freq: Every day | ORAL | Status: DC
  Administered 2020-09-08: 20 mg via ORAL
  Filled 2020-09-08: qty 1

## 2020-09-08 MED ORDER — TRAZODONE HCL 100 MG PO TABS: 100.0000 mg | ORAL_TABLET | Freq: Every day | ORAL | Status: DC

## 2020-09-08 NOTE — Discharge Summary (Signed)
[facility] [place] NAME: [name]    MR#:  [id number]  DATE OF BIRTH:  1945/08/23  DATE OF ADMISSION:  09/07/2020 ADMITTING PHYSICIAN: Athena Masse, MD  DATE OF DISCHARGE: 09/08/2020  PRIMARY CARE PHYSICIAN: Rusty Aus, MD    ADMISSION DIAGNOSIS:  CHF exacerbation ([place]) [I50.9] Acute on chronic diastolic congestive heart failure (HCC) [I50.33]  DISCHARGE DIAGNOSIS:  Acute on chronic diastolic congestive heart failure/medication noncompliance, dietary noncompliance  SECONDARY DIAGNOSIS:   Past Medical History:  Diagnosis Date  . 1st degree AV block   . ACE-inhibitor cough   . Allergic rhinitis   . Anemia    iron deficiency anemia  . Anxiety   . Aortic ectasia (HCC)    a. CT abd in 12/2016 incidentally noted aortic atherosclerosis and infrarenal abdominal aortic ectasia measuring as large as 2.7 cm with recommendation to repeat US in 2023.  Marland Kitchen Arthritis   . Asthma   . Cataract   . Chronic depression   . Chronic diastolic CHF (congestive heart failure) ([place])   . Chronic headache   . COPD (chronic obstructive pulmonary disease) ([place])   . Coronary artery disease    a. DES to RCA and mid Cx 2009. b. CABG and bioprosthetic AVR May 2015. c. cutting balloon to prox Cx in 05/2016  . Diabetes mellitus    type 2  . Diverticulitis of colon   . Essential hypertension   . GERD (gastroesophageal reflux disease)   . Hearing loss   . History of blood transfusion 2013  . History of prosthetic aortic valve replacement   . HOH (hard of hearing)   . Hypercholesterolemia    intolerance of statins and niaspan  . IDA (iron deficiency anemia) 02/03/2019  . Mobitz type 1 second degree AV block   . OSA (obstructive sleep apnea)    mild, intolerant of cpap  . PAD (peripheral artery disease) ([place])    a. atherosclerosis by CT abd 12/2016 in [place].  Marland Kitchen PONV (postoperative nausea and vomiting)   . Statin intolerance   . Thyroid disease      HOSPITAL COURSE:   [name] is a 75 y.o. female with medical history significant for HTN, DM, hypothyroidism, CKD stage llla CAD status post CABG 2015, history of bioprosthetic AVR, insulin-dependent type 2 diabetes, OSA, depression and anxiety and COPD, recently diagnosed diastolic heart failure on a hospitalization from 3/7-3/8(patient deferred echocardiogram), but with last EF of 55% in 2019, who presents with a 1 day history of worsening of her chronic shortness of breath.  She denies chest pain and denies lower extremity pain or swelling.  Acute on chronic diastolic CHF (congestive heart failure) in the setting of multifactorial chronic dyspnea --NON compliance to diet and lasix -Patient with chronic dyspnea acutely worsening over the last 24 hours, with elevated BNP 441, up from 141 2 months prior, chest x-ray with cardiomegaly and vascular congestion -Last echocardiogram 2019 with grade 2 diastolic dysfunction and EF 55 to 60% -Patient has chronic dyspnea and has been extensively evaluated.  Had right heart cath on 12/2018 without evidence of pulmonary HTN --wears oxygen at nite chronically -IV Lasix.--good uop. Feels back to baseline. Change to po lasix 20 mg+ KCl 10 meq--refills #4 given --dietitian to see pt --pt to f/u [facility] [address] Myrna Blazer -- Continue home metoprolol and ARB -  Salt restriction    Elevated troponin--demand ischemia   Coronary Artery Disease s/p CABG 10/2013 -Troponin elevation  of 21/38 without chest pain or acute EKG changes -Likely secondary to supply demand mismatch -Patient had cath 09/2017 that showed severe native three-vessel disease with patency of grafts and there was no indication for PCI and medical management was continued -Continue home aspirin, beta-blocker patient is statin intolerant    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 -No acute disease suspected    Acquired hypothyroidism -Continue levothyroxine    DM type 2 (diabetes mellitus, type 2)  (HCC) -Sliding scale insulin and insulin 70/30    Obstructive sleep apnea -Patient intolerant of CPAP    Essential hypertension -Continue home meds     Chronic obstructive pulmonary disease ([place]) -Not acutely exacerbated  Patient is advised to follow-up with cardiology as outpatient. I have asked Rogers MG PA to make appointment. Lasix and potassium prescriptions have been called in with refills. Patient is advise low-salt diet. Dietitian will see patient prior to discharge. All of the above was discussed with patient's daughter on the phone.  Will arrange home health nurse to do CHF education and follow-up.    DVT prophylaxis: Lovenox  Code Status: full code  Family Communication:  dter on the phone  Disposition Plan: Back to previous home environment today Consults called: none Status:At the time of admission, it appears that the appropriate admission status for this patient is INPATIENT  Overall best optimized and at baseline. Patient is in agreement to go home. CONSULTS OBTAINED:    DRUG ALLERGIES:   Allergies  Allergen Reactions  . Amitriptyline Other (See Comments)    Unknown reaction  . Benadryl [Diphenhydramine] Shortness Of Breath  . Demerol [Meperidine] Other (See Comments)    Unknown reaction  . Gabapentin Other (See Comments)    Unknown reaction  . Loratadine Other (See Comments)    Unknown reaction  . Meperidine Hcl Other (See Comments)    Unknown reaction  . Mirtazapine Other (See Comments)    Unknown reaction  . Olanzapine Other (See Comments)    Unknown reaction   . Voltaren [Diclofenac Sodium] Shortness Of Breath  . Zetia [Ezetimibe] Other (See Comments)    Weakness in legs, shakiness all over  . Ativan [Lorazepam] Other (See Comments)    Causes double vision at highter than .5 mg dose  . Atorvastatin Other (See Comments)    Muscle aches and weakness  . Budesonide-Formoterol Fumarate Other (See Comments)    Shakiness, tremors  . Bupropion Hcl  Other (See Comments)    "cloud over me" depression  . Caffeine Other (See Comments)    jitters  . Codeine Sulfate Other (See Comments)    Makes chest hurt like a heart attack  . Lisinopril Cough  . Metformin Nausea And Vomiting  . Mometasone Furoate Nausea And Vomiting  . Morphine Sulfate Other (See Comments)    Chest pain like a heart attack  . Other Other (See Comments)    Beta Blockers, reaction shortness of breath  . Oxycodone-Acetaminophen Nausea And Vomiting  . Pioglitazone Other (See Comments)    Cannot take because of risk of bladder cancer  . Propoxyphene N-Acetaminophen Nausea And Vomiting  . Rosuvastatin Other (See Comments)    Muscle aches and weakness  . Shellfish Allergy Diarrhea  . Suvorexant Other (See Comments)    Jerking/nervous   . Ticagrelor     Other reaction(s): Other (See Comments) "slowed heart rate" & chest pain  . Tramadol Nausea Only  . Trazodone And Nefazodone Nausea And Vomiting  . Venlafaxine Other (See Comments)    Unknown  reaction  . Zolpidem Tartrate Other (See Comments)     Jittery, diarrhea  . Latex Rash    DISCHARGE MEDICATIONS:   Allergies as of 09/08/2020      Reactions   Amitriptyline Other (See Comments)   Unknown reaction   Benadryl [diphenhydramine] Shortness Of Breath   Demerol [meperidine] Other (See Comments)   Unknown reaction   Gabapentin Other (See Comments)   Unknown reaction   Loratadine Other (See Comments)   Unknown reaction   Meperidine Hcl Other (See Comments)   Unknown reaction   Mirtazapine Other (See Comments)   Unknown reaction   Olanzapine Other (See Comments)   Unknown reaction   Voltaren [diclofenac Sodium] Shortness Of Breath   Zetia [ezetimibe] Other (See Comments)   Weakness in legs, shakiness all over   Ativan [lorazepam] Other (See Comments)   Causes double vision at highter than .5 mg dose   Atorvastatin Other (See Comments)   Muscle aches and weakness   Budesonide-formoterol Fumarate Other  (See Comments)   Shakiness, tremors   Bupropion Hcl Other (See Comments)   "cloud over me" depression   Caffeine Other (See Comments)   jitters   Codeine Sulfate Other (See Comments)   Makes chest hurt like a heart attack   Lisinopril Cough   Metformin Nausea And Vomiting   Mometasone Furoate Nausea And Vomiting   Morphine Sulfate Other (See Comments)   Chest pain like a heart attack   Other Other (See Comments)   Beta Blockers, reaction shortness of breath   Oxycodone-acetaminophen Nausea And Vomiting   Pioglitazone Other (See Comments)   Cannot take because of risk of bladder cancer   Propoxyphene N-acetaminophen Nausea And Vomiting   Rosuvastatin Other (See Comments)   Muscle aches and weakness   Shellfish Allergy Diarrhea   Suvorexant Other (See Comments)   Jerking/nervous    Ticagrelor    Other reaction(s): Other (See Comments) "slowed heart rate" & chest pain   Tramadol Nausea Only   Trazodone And Nefazodone Nausea And Vomiting   Venlafaxine Other (See Comments)   Unknown reaction   Zolpidem Tartrate Other (See Comments)    Jittery, diarrhea   Latex Rash      Medication List    TAKE these medications   albuterol (2.5 MG/3ML) 0.083% nebulizer solution Commonly known as: PROVENTIL Take 2.5 mg by nebulization every 4 (four) hours as needed.   ARIPiprazole 2 MG tablet Commonly known as: ABILIFY Take 2 mg by mouth daily.   aspirin 81 MG EC tablet Take 1 tablet (81 mg total) by mouth daily. Swallow whole.   bisoprolol 5 MG tablet Commonly known as: ZEBETA Take 5 mg by mouth daily.   CALTRATE 600 PLUS-VIT D PO Take 1 tablet by mouth 2 (two) times daily.   DULoxetine 20 MG capsule Commonly known as: CYMBALTA Take 2 capsules (40 mg total) by mouth daily. What changed: See the new instructions.   furosemide 20 MG tablet Commonly known as: Lasix Take 1 tablet (20 mg total) by mouth daily.   glipiZIDE 10 MG 24 hr tablet Commonly known as: GLUCOTROL  XL Take 10 mg by mouth daily.   insulin NPH-regular Human (70-30) 100 UNIT/ML injection Inject 60 Units into the skin 2 (two) times daily with a meal. Inject 70u under the skin every morning at breakfast and inject 70u under the skin every evening with supper   levothyroxine 125 MCG tablet Commonly known as: SYNTHROID Take 125 mcg by mouth daily.  losartan 25 MG tablet Commonly known as: COZAAR Take 1 tablet (25 mg total) by mouth daily.   nitroGLYCERIN 0.4 MG SL tablet Commonly known as: NITROSTAT Place 1 tablet (0.4 mg total) under the tongue every 5 (five) minutes as needed for chest pain. Please make overdue appt with Dr. Angelena Form before anymore refills. 1st attempt   pantoprazole 20 MG tablet Commonly known as: PROTONIX Take 20 mg by mouth daily.   potassium chloride 10 MEQ tablet Commonly known as: KLOR-CON Take 1 tablet (10 mEq total) by mouth daily.   promethazine 25 MG tablet Commonly known as: PHENERGAN Take 25 mg by mouth every 6 (six) hours as needed for nausea or vomiting.   traZODone 100 MG tablet Commonly known as: DESYREL Take 100 mg by mouth at bedtime.       If you experience worsening of your admission symptoms, develop shortness of breath, life threatening emergency, suicidal or homicidal thoughts you must seek medical attention immediately by calling 911 or calling your MD immediately  if symptoms less severe.  You Must read complete instructions/literature along with all the possible adverse reactions/side effects for all the Medicines you take and that have been prescribed to you. Take any new Medicines after you have completely understood and accept all the possible adverse reactions/side effects.   Please note  You were cared for by a hospitalist during your hospital stay. If you have any questions about your discharge medications or the care you received while you were in the hospital after you are discharged, you can call the unit and asked to  speak with the hospitalist on call if the hospitalist that took care of you is not available. Once you are discharged, your primary care physician will handle any further medical issues. Please note that NO REFILLS for any discharge medications will be authorized once you are discharged, as it is imperative that you return to your primary care physician (or establish a relationship with a primary care physician if you do not have one) for your aftercare needs so that they can reassess your need for medications and monitor your lab values. Today   SUBJECTIVE  C/o chronic arthritis pain   VITAL SIGNS:  Blood pressure (!) 158/67, pulse 72, temperature 97.8 F (36.6 C), resp. rate 18, height 5\' 6"  (1.676 m), weight 88.3 kg, SpO2 96 %.  I/O:    Intake/Output Summary (Last 24 hours) at 09/08/2020 1030 Last data filed at 09/08/2020 0954 Gross per 24 hour  Intake 240 ml  Output 1450 ml  Net -1210 ml    PHYSICAL EXAMINATION:  GENERAL:  74 y.o.-year-old patient lying in the bed with no acute distress. Obese LUNGS:  Normal breath sounds bilaterally, no wheezing, rales,rhonchi or crepitation. No use of accessory muscles of respiration.  CARDIOVASCULAR: S1, S2 normal. No murmurs, rubs, or gallops.  ABDOMEN: Soft, non-tender, non-distended. Bowel sounds present. No organomegaly or mass.  EXTREMITIES: No pedal edema, cyanosis, or clubbing.  NEUROLOGIC: Cranial nerves II through XII are intact. Muscle strength 5/5 in all extremities. Sensation intact. Gait not checked.  PSYCHIATRIC: The patient is alert and oriented x 3.  SKIN: No obvious rash, lesion, or ulcer.   DATA REVIEW:   CBC  Recent Labs  Lab 09/07/20 1953  WBC 9.3  HGB 10.7*  HCT 33.1*  PLT 167    Chemistries  Recent Labs  Lab 09/08/20 0525  NA 139  K 3.4*  CL 99  CO2 30  GLUCOSE 170*  BUN 18  CREATININE 0.99  CALCIUM 8.4*    Microbiology Results   Recent Results (from the past 240 hour(s))  Resp Panel by RT-PCR (Flu  A&B, Covid) Nasopharyngeal Swab     Status: None   Collection Time: 09/07/20 11:50 PM   Specimen: Nasopharyngeal Swab; Nasopharyngeal(NP) swabs in vial transport medium  Result Value Ref Range Status   SARS Coronavirus 2 by RT PCR NEGATIVE NEGATIVE Final    Comment: (NOTE) SARS-CoV-2 target nucleic acids are NOT DETECTED.  The SARS-CoV-2 RNA is generally detectable in upper respiratory specimens during the acute phase of infection. The lowest concentration of SARS-CoV-2 viral copies this assay can detect is 138 copies/mL. A negative result does not preclude SARS-Cov-2 infection and should not be used as the sole basis for treatment or other patient management decisions. A negative result may occur with  improper specimen collection/handling, submission of specimen other than nasopharyngeal swab, presence of viral mutation(s) within the areas targeted by this assay, and inadequate number of viral copies(<138 copies/mL). A negative result must be combined with clinical observations, patient history, and epidemiological information. The expected result is Negative.  Fact Sheet for Patients:  EntrepreneurPulse.com.au  Fact Sheet for Healthcare Providers:  IncredibleEmployment.be  This test is no t yet approved or cleared by the [place] FDA and  has been authorized for detection and/or diagnosis of SARS-CoV-2 by FDA under an Emergency Use Authorization (EUA). This EUA will remain  in effect (meaning this test can be used) for the duration of the COVID-19 declaration under Section 564(b)(1) of the Act, 21 U.S.C.section 360bbb-3(b)(1), unless the authorization is terminated  or revoked sooner.       Influenza A by PCR NEGATIVE NEGATIVE Final   Influenza B by PCR NEGATIVE NEGATIVE Final    Comment: (NOTE) The Xpert Xpress SARS-CoV-2/FLU/RSV plus assay is intended as an aid in the diagnosis of influenza from Nasopharyngeal swab specimens  and should not be used as a sole basis for treatment. Nasal washings and aspirates are unacceptable for Xpert Xpress SARS-CoV-2/FLU/RSV testing.  Fact Sheet for Patients: EntrepreneurPulse.com.au  Fact Sheet for Healthcare Providers: IncredibleEmployment.be  This test is not yet approved or cleared by the [place] FDA and has been authorized for detection and/or diagnosis of SARS-CoV-2 by FDA under an Emergency Use Authorization (EUA). This EUA will remain in effect (meaning this test can be used) for the duration of the COVID-19 declaration under Section 564(b)(1) of the Act, 21 U.S.C. section 360bbb-3(b)(1), unless the authorization is terminated or revoked.  Performed at [facility], [address] [place], [place] [address]     RADIOLOGY:  DG Chest 2 View  Result Date: 09/07/2020 CLINICAL DATA:  Chest pain EXAM: CHEST - 2 VIEW COMPARISON:  08/08/2020 FINDINGS: Prior median sternotomy and valve replacement. Cardiomegaly, vascular congestion. Interstitial prominence again noted, slightly increasing since prior study, likely interstitial edema. No effusions. No acute bony abnormality. IMPRESSION: Cardiomegaly with vascular congestion and worsening interstitial prominence, likely edema. Electronically Signed   By: Rolm Baptise M.D.   On: 09/07/2020 20:02     CODE STATUS:     Code Status Orders  (From admission, onward)         Start     Ordered   09/07/20 2345  Full code  Continuous        09/07/20 2346        Code Status History    Date Active Date Inactive Code Status Order ID Comments User Context   08/08/2020 1408  08/09/2020 2126 Full Code [id number]  Clance Boll, MD ED   01/08/2019 0216 01/08/2019 2013 Full Code [id number]  Lance Coon, MD Inpatient   12/28/2017 1429 12/29/2017 1918 Full Code [id number]  Demetrios Loll, MD ED   09/19/2017 1551 09/20/2017 2059 Full Code [id number]  Almyra Deforest, [place] Inpatient   08/16/2016 1059  08/24/2016 1905 Full Code [id number]  Nicholes Mango, MD Inpatient   05/27/2016 0239 05/27/2016 1955 Full Code [id number]  Carmon Ginsberg, MD Inpatient   05/25/2016 0023 05/26/2016 1532 Full Code [id number]  Jay Schlichter, MD Inpatient   07/24/2014 0010 07/24/2014 1945 Full Code [id number]  Manus Gunning, MD Inpatient   10/12/2013 2032 10/15/2013 1936 Full Code [id number]  John Giovanni, PA-C Inpatient   10/09/2013 1930 10/12/2013 2032 Full Code [id number]  [name], MD Inpatient   10/08/2013 2312 10/09/2013 1930 Full Code [id number]  Stephani Police, MD ED   Advance Care Planning Activity       TOTAL TIME TAKING CARE OF THIS PATIENT: *35* minutes.    Fritzi Mandes M.D  [facility]    CC: Primary care physician; Rusty Aus, MD

## 2020-09-08 NOTE — Discharge Instructions (Signed)
Heart Failure, Self-Care Heart failure is a serious condition. The following information explains things you need to do to take care of yourself at home. To help you stay as healthy as possible, you may be asked to change your diet, take certain medicines, and make other changes in your life. Your doctor may also give you more specific instructions. If you have problems or questions, call your doctor. What are the risks? Having heart failure makes it more likely for you to have some problems. These problems can get worse if you do not take good care of yourself. Problems may include:  Damage to the kidneys, liver, or lungs.  Malnutrition.  Abnormal heart rhythms.  Blood clotting problems that could cause a stroke. Supplies needed:  Scale for weighing yourself.  Blood pressure monitor.  Notebook.  Medicines. How to care for yourself when you have heart failure Medicines Take over-the-counter and prescription medicines only as told by your doctor. Take your medicines every day.  Do not stop taking your medicine unless your doctor tells you to do so.  Do not skip any medicines.  Get your prescriptions refilled before you run out of medicine. This is important.  Talk with your doctor if you cannot afford your medicines. Eating and drinking  Eat heart-healthy foods. Talk with a diet specialist (dietitian) to create an eating plan.  Limit salt (sodium) if told by your doctor. Ask your diet specialist to tell you which seasonings are healthy for your heart.  Cook in healthy ways instead of frying. Healthy ways of cooking include roasting, grilling, broiling, baking, poaching, steaming, and stir-frying.  Choose foods that: ? Have no trans fat. ? Are low in saturated fat and cholesterol.  Choose healthy foods, such as: ? Fresh or frozen fruits and vegetables. ? Fish. ? Low-fat (lean) meats. ? Legumes, such as beans, peas, and lentils. ? Fat-free or low-fat dairy  products. ? Whole-grain foods. ? High-fiber foods.  Limit how much fluid you drink, if told by your doctor.   Alcohol use  Do not drink alcohol if: ? Your doctor tells you not to drink. ? Your heart was damaged by alcohol, or you have very bad heart failure. ? You are pregnant, may be pregnant, or are planning to become pregnant.  If you drink alcohol: ? Limit how much you have to:  0-1 drink a day for women.  0-2 drinks a day for men. ? Know how much alcohol is in your drink. In the [place] one drink equals one 12 oz bottle of beer (355 mL), one 5 oz glass of wine (148 mL), or one 1 oz glass of hard liquor (44 mL). Lifestyle  Do not smoke or use any products that contain nicotine or tobacco. If you need help quitting, ask your doctor. ? Do not use nicotine gum or patches before talking to your doctor.  Do not use illegal drugs.  Lose weight if told by your doctor.  Do physical activity if told by your doctor. Talk to your doctor before you begin an exercise if: ? You are an older adult. ? You have very bad heart failure.  Learn to manage stress. If you need help, ask your doctor.  Get physical rehab (rehabilitation) to help you stay independent and to help with your quality of life.  Participate in a cardiac rehab program. This program helps you improve your health through exercise, education, and counseling.  Plan time to rest when you get tired.   Check  weight and blood pressure  Weigh yourself every day. This will help you to know if fluid is building up in your body. ? Weigh yourself every morning after you pee (urinate) and before you eat breakfast. ? Wear the same amount of clothing each time. ? Write down your daily weight. Give your record to your doctor.  Check and write down your blood pressure as told by your doctor.  Check your pulse as told by your doctor.   Dealing with very hot and very cold weather  If it is very hot: ? Avoid activities that take a  lot of energy. ? Use air conditioning or fans, or find a cooler place. ? Avoid caffeine and alcohol. ? Wear clothing that is loose-fitting, lightweight, and light-colored.  If it is very cold: ? Avoid activities that take a lot of energy. ? Layer your clothes. ? Wear mittens or gloves, a hat, and a face covering when you go outside. ? Avoid alcohol. Follow these instructions at home:  Stay up to date with shots (vaccines). Get pneumococcal and flu (influenza) shots.  Keep all follow-up visits. Contact a doctor if:  You gain 2-3 lb (1-1.4 kg) in 24 hours or 5 lb (2.3 kg) in a week.  You have increasing shortness of breath.  You cannot do your normal activities.  You get tired easily.  You cough a lot.  You do not feel like eating or feel like you may vomit (nauseous).  You have swelling in your hands, feet, ankles, or belly (abdomen).  You cannot sleep well because it is hard to breathe.  You feel like your heart is beating fast (palpitations).  You get dizzy when you stand up.  You feel depressed or sad. Get help right away if:  You have trouble breathing.  You or someone else notices a change in your behavior, such as having trouble staying awake.  You have chest pain or discomfort.  You pass out (faint). These symptoms may be an emergency. Get help right away. Call your local emergency services (911 in the [place]  Do not wait to see if the symptoms will go away.  Do not drive yourself to the hospital. Summary  Heart failure is a serious condition. To care for yourself, you may have to change your diet, take medicines, and make other lifestyle changes.  Take your medicines every day. Do not stop taking them unless your doctor tells you to do so.  Limit salt and eat heart-healthy foods.  Ask your doctor if you can drink alcohol. You may have to stop alcohol use if you have very bad heart failure.  Contact your doctor if you gain weight quickly or feel  that your heart is beating too fast. Get help right away if you pass out or have chest pain or trouble breathing. This information is not intended to replace advice given to you by your health care provider. Make sure you discuss any questions you have with your health care provider. Document Revised: 12/12/2019 Document Reviewed: 12/12/2019 Elsevier Patient Education  [place] advised  2 g sodium diet, carb control diet  Follow up with Cardiology on your appt

## 2020-09-08 NOTE — Consult Note (Addendum)
   Heart Failure Nurse Navigator Note  HFpEF 55-60%.  Grade 2 diastolic dysfunction.  Presented to the emergency room with complaints of shortness of breath.  She was last hospitalized in March 2020, canceled her heart failure clinic appointment on March 16.  Comorbidities:  COPD Hypertension Hyperlipidemia Diabetes Coronary artery disease/coronary artery bypass grafting/aortic valve replacement in 2015 Arthritis Anemia Obstructive sleep apnea intolerant of CPAP.  Labs:  Sodium 137, potassium 3.4, chloride 99, CO2 30, BUN 18, creatinine 0.99, BUN 441   Medications:  Aspirin 81 mg daily Bisoprolol 5 mg daily Furosemide 40 mg IV every 12 Losartan 25 mg daily Potassium chloride 10 mEq   Assessment:  General-she is awake and alert lying in bed complaining of leg cramps.  HEENT-edentulous.  Cardiac-heart tones of regular rate and rhythm.  Chest-breath sounds are clear to posterior auscultation.  Abdomen-rounded soft non went over tender  Lower extremities-there is no lower extremity edema noted.  Psych-is pleasant and appropriate.  Neurologic-speech is clear moves all extremities without difficulty.   Met with patient today, discussed the importance of her keeping her appointment with the heart failure clinic as she had canceled last one.  She questioned if even she is feeling good should she come in instructed yes that she needs to get established with the heart failure clinic and being able to keep her from further admissions for heart failure.  Discussed removing the saltshaker from the table along with eating heart healthy foods low in sodium.  Also discussed fluid restriction of no more than 64 ounces/eight 8 ounce cups in a 24-hour.  Also discussed using frozen grapes or strawberries to aid in clinching thirst.  She could also use hard candy.  Stressed the importance of sticking with the fluid restriction as with taking a diuretic if she goes over that limit  that she defeats the purpose of taking the water pill.   Went over, daily weights, recording and the importance of reporting a 2 to 3 pound weight gain overnight or 5 pounds within a week.  Went over the zone magnet and what it means..  She is to be seen at the heart failure clinic on April April 13.  Pricilla Riffle RN [facility]

## 2020-09-08 NOTE — Plan of Care (Signed)
  Problem: Education: Goal: Knowledge of General Education information will improve Description: Including pain rating scale, medication(s)/side effects and non-pharmacologic comfort measures 09/08/2020 1037 by Rogers Seeds, RN Outcome: Adequate for Discharge 09/08/2020 1036 by Rogers Seeds, RN Outcome: Progressing   Problem: Health Behavior/Discharge Planning: Goal: Ability to manage health-related needs will improve 09/08/2020 1037 by Rogers Seeds, RN Outcome: Adequate for Discharge 09/08/2020 1036 by Rogers Seeds, RN Outcome: Progressing   Problem: Clinical Measurements: Goal: Ability to maintain clinical measurements within normal limits will improve 09/08/2020 1037 by Rogers Seeds, RN Outcome: Adequate for Discharge 09/08/2020 1036 by Rogers Seeds, RN Outcome: Progressing Goal: Will remain free from infection 09/08/2020 1037 by Rogers Seeds, RN Outcome: Adequate for Discharge 09/08/2020 1036 by Rogers Seeds, RN Outcome: Progressing Goal: Diagnostic test results will improve 09/08/2020 1037 by Rogers Seeds, RN Outcome: Adequate for Discharge 09/08/2020 1036 by Rogers Seeds, RN Outcome: Progressing Goal: Respiratory complications will improve 09/08/2020 1037 by Rogers Seeds, RN Outcome: Adequate for Discharge 09/08/2020 1036 by Rogers Seeds, RN Outcome: Progressing Goal: Cardiovascular complication will be avoided 09/08/2020 1037 by Rogers Seeds, RN Outcome: Adequate for Discharge 09/08/2020 1036 by Rogers Seeds, RN Outcome: Progressing   Problem: Activity: Goal: Risk for activity intolerance will decrease 09/08/2020 1037 by Rogers Seeds, RN Outcome: Adequate for Discharge 09/08/2020 1036 by Rogers Seeds, RN Outcome: Progressing   Problem: Nutrition: Goal: Adequate nutrition will be maintained 09/08/2020 1037 by Rogers Seeds, RN Outcome: Adequate for Discharge 09/08/2020 1036 by Rogers Seeds, RN Outcome: Progressing   Problem: Coping: Goal: Level of anxiety will  decrease 09/08/2020 1037 by Rogers Seeds, RN Outcome: Adequate for Discharge 09/08/2020 1036 by Rogers Seeds, RN Outcome: Progressing   Problem: Elimination: Goal: Will not experience complications related to bowel motility 09/08/2020 1037 by Rogers Seeds, RN Outcome: Adequate for Discharge 09/08/2020 1036 by Rogers Seeds, RN Outcome: Progressing Goal: Will not experience complications related to urinary retention 09/08/2020 1037 by Rogers Seeds, RN Outcome: Adequate for Discharge 09/08/2020 1036 by Rogers Seeds, RN Outcome: Progressing   Problem: Pain Managment: Goal: General experience of comfort will improve 09/08/2020 1037 by Rogers Seeds, RN Outcome: Adequate for Discharge 09/08/2020 1036 by Rogers Seeds, RN Outcome: Progressing   Problem: Safety: Goal: Ability to remain free from injury will improve 09/08/2020 1037 by Rogers Seeds, RN Outcome: Adequate for Discharge 09/08/2020 1036 by Rogers Seeds, RN Outcome: Progressing   Problem: Skin Integrity: Goal: Risk for impaired skin integrity will decrease 09/08/2020 1037 by Rogers Seeds, RN Outcome: Adequate for Discharge 09/08/2020 1036 by Rogers Seeds, RN Outcome: Progressing   Problem: Education: Goal: Ability to demonstrate management of disease process will improve 09/08/2020 1037 by Rogers Seeds, RN Outcome: Adequate for Discharge 09/08/2020 1036 by Rogers Seeds, RN Outcome: Progressing Goal: Ability to verbalize understanding of medication therapies will improve 09/08/2020 1037 by Rogers Seeds, RN Outcome: Adequate for Discharge 09/08/2020 1036 by Rogers Seeds, RN Outcome: Progressing Goal: Individualized Educational Video(s) 09/08/2020 1037 by Rogers Seeds, RN Outcome: Adequate for Discharge 09/08/2020 1036 by Rogers Seeds, RN Outcome: Progressing   Problem: Activity: Goal: Capacity to carry out activities will improve 09/08/2020 1037 by Rogers Seeds, RN Outcome: Adequate for Discharge 09/08/2020 1036 by Rogers Seeds,  RN Outcome: Progressing   Problem: Cardiac: Goal: Ability to achieve and maintain adequate cardiopulmonary perfusion will improve 09/08/2020 1037 by Rogers Seeds, RN Outcome: Adequate for Discharge 09/08/2020 1036 by Rogers Seeds, RN Outcome: Progressing

## 2020-09-08 NOTE — Care Management Obs Status (Signed)
[place] NOTIFICATION   Patient Details  Name: [name] MRN: [id number] Date of Birth: Apr 01, 1946   Medicare Observation Status Notification Given:  Yes    Beverly Sessions, RN 09/08/2020, 11:45 AM

## 2020-09-08 NOTE — TOC Initial Note (Addendum)
Transition of Care [facility]) - Initial/Assessment Note    Patient Details  Name: [name] MRN: [id number] Date of Birth: [birth year]  Transition of Care [facility]) CM/SW Contact:    Beverly Sessions, RN Phone Number: 09/08/2020, 11:52 AM  Clinical Narrative:                 Patient admitted from home with CHF Patient states that she lives at home with her husband who is a "paraplegic" PCP [place] issues obtaining medications  Patient to discharge home today.  Daughter to pick up at discharge Patient states that her son and daughter in law live next door, and her daughter lives locally however they all work.  Sometimes it is difficult for them to coordinate transportation.  Patient provided information in [facility] transportation services.   Home health RN ordered for Heart Failure.  Patient in agreement and states she does not have a preference of home health agency. Referral made to [place] with [facility]   Patient states that she has a WC and RW in the home.  Requests [facility].  [place] with [employer] to deliver prior to discharge   Patient confirms she has scales in the home to check daily weights.  Notified to contact her MD or home health RN if there is any increase in weight  Expected Discharge Plan: [place] Barriers to Discharge: No Barriers Identified   Patient Goals and CMS Choice        Expected Discharge Plan and Services Expected Discharge Plan: [place] In-house Referral: Clinical Social Work     Living arrangements for the past 2 months: [place] Expected Discharge Date: 09/08/20               DME Arranged: 3-N-1 DME Agency: [employer] Date DME Agency Contacted: 09/08/20   Representative spoke with at DME Agency: [place]: RN [place] Agency: [place] Date [place]: 09/08/20   Representative spoke with at [place]: Sarah  Prior Living Arrangements/Services Living  arrangements for the past 2 months: [place] with:: Spouse Patient language and need for interpreter reviewed:: Yes        Need for Family Participation in Patient Care: Yes (Comment) Care giver support system in place?: Yes (comment) Current home services: DME Criminal Activity/Legal Involvement Pertinent to Current Situation/Hospitalization: No - Comment as needed  Activities of Daily Living Home Assistive Devices/Equipment: Shower chair without back ADL Screening (condition at time of admission) Patient's cognitive ability adequate to safely complete daily activities?: Yes Is the patient deaf or have difficulty hearing?: Yes Does the patient have difficulty seeing, even when wearing glasses/contacts?: No Does the patient have difficulty concentrating, remembering, or making decisions?: No Patient able to express need for assistance with ADLs?: Yes Does the patient have difficulty dressing or bathing?: No Independently performs ADLs?: Yes (appropriate for developmental age) Does the patient have difficulty walking or climbing stairs?: No Weakness of Legs: None Weakness of Arms/Hands: None  Permission Sought/Granted                  Emotional Assessment Appearance:: Appears stated age Attitude/Demeanor/Rapport: Engaged Affect (typically observed): Accepting Orientation: : Oriented to Self,Oriented to Place,Oriented to  Time,Oriented to Situation Alcohol / Substance Use: Not Applicable Psych Involvement: No (comment)  Admission diagnosis:  CHF exacerbation ([place]) [I50.9] Acute on chronic diastolic congestive heart failure ([place]) [I50.33] Patient Active Problem List   Diagnosis  Date Noted  . CHF exacerbation ([place]) 09/07/2020  . Elevated troponin 09/07/2020  . Chest pain 08/09/2020  . Acute on chronic diastolic CHF (congestive heart failure) ([place]) 08/08/2020  . Anemia due to stage 3a chronic kidney disease ([place]) 05/21/2019  . IDA (iron deficiency anemia)  02/03/2019  . COPD exacerbation ([place]) 12/28/2017  . Depression, major, single episode, complete remission ([place]) 04/11/2017  . 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 Aortic ectasia, abdominal ([place]) 01/01/2017  . GAD (generalized anxiety disorder) 09/26/2016  . Acute posthemorrhagic anemia 08/24/2016  . Acute respiratory failure with hypoxia ([place]) 08/24/2016  . Acute diastolic CHF (congestive heart failure) ([employer]) 08/24/2016  . Hyponatremia 08/24/2016  . Leukocytosis 08/24/2016  . Fever 08/24/2016  . Diarrhea 08/24/2016  . Generalized weakness 08/24/2016  . Acute diverticulitis 08/16/2016  . Gastrointestinal hemorrhage   . Ataxia 08/09/2016  . Dizziness 08/09/2016  . Staggering gait 08/09/2016  . NSTEMI (non-ST elevated myocardial infarction) ([place])   . AVB (atrioventricular block)   . Atypical chest pain   . Unstable angina ([place]) 05/25/2016  . Palpitations 05/25/2016  . PUD - recent Rx for H.Pylori 05/25/2016  . Stenosis of coronary artery stent   . Recurrent major depressive disorder, in partial remission ([place]) 07/29/2015  . Sialadenitis 04/25/2015  . Viral URI 03/24/2015  . Hypoglycemia 08/25/2014  . Nausea without vomiting 08/25/2014  . Dyslipidemia-statin ontol   . Long-term insulin use ([place]) 03/31/2014  . Aortic valve disorder 11/11/2013  . 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 Coronary Artery Disease s/p CABG 10/2013 10/10/2013  . Other malaise and fatigue 09/11/2013  . Alopecia 03/06/2013  . Insomnia 02/06/2013  . Gastroesophageal reflux disease without esophagitis 01/19/2013  . Dysphagia, unspecified(787.20) 01/19/2013  . Dyspnea 11/17/2012  . Weakness 11/17/2012  . Abnormal MRI of head 09/05/2012  . Depression with anxiety 03/21/2012  . Extrinsic asthma 11/12/2011  . Anemia 06/07/2011  . Chronic obstructive pulmonary disease ([place]) 01/25/2010  . Acquired hypothyroidism 08/10/2009  . MICROSCOPIC HEMATURIA 05/07/2009  . GOITER 03/02/2009  . Essential  hypertension 01/06/2009  . MEMORY LOSS 06/23/2008  . Obstructive sleep apnea 12/25/2007  . DM type 2 (diabetes mellitus, type 2) ([place]) 09/09/2006  . ALLERGIC RHINITIS 09/09/2006  . DIVERTICULOSIS, COLON 09/09/2006   PCP:  Rusty Aus, MD Pharmacy:   [facility] [address], [place] - [place] [address] [place] [address] Phone: [phone] Fax: [phone]     Social Determinants of Health (SDOH) Interventions    Readmission Risk Interventions Readmission Risk Prevention Plan 09/08/2020  Transportation Screening Complete  PCP or Specialist Appt within 3-5 Days Complete  HRI or Home Care Consult Complete  Palliative Care Screening Not Applicable  Medication Review (RN Care Manager) Complete  Some recent data might be hidden

## 2020-09-08 NOTE — Plan of Care (Signed)

## 2020-09-08 NOTE — Care Management CC44 (Signed)
Condition Code 44 Documentation Completed  Patient Details  Name: ELEONOR OCON MRN: [id number] Date of Birth: 01-17-1946   Condition Code 44 given:  Yes Patient signature on Condition Code 44 notice:  Yes Documentation of 2 MD's agreement:  Yes Code 44 added to claim:  Yes    Beverly Sessions, RN 09/08/2020, 11:45 AM

## 2020-09-08 NOTE — Telephone Encounter (Signed)
Theora Gianotti, NP  P Cv Div [place] Scheduling Good morning,   Would you pls arrange f/u w/ APP in a few wks. Looks like she already has f/u in CHF clinic next week.   Thanks,   Fawn Kirk to patient spouse Not ready to schedule at this time Will call back later

## 2020-09-12 ENCOUNTER — Inpatient Hospital Stay: Payer: Medicare HMO | Attending: Oncology

## 2020-09-12 DIAGNOSIS — J449 Chronic obstructive pulmonary disease, unspecified: Secondary | ICD-10-CM | POA: Insufficient documentation

## 2020-09-12 DIAGNOSIS — Z79899 Other long term (current) drug therapy: Secondary | ICD-10-CM | POA: Diagnosis not present

## 2020-09-12 DIAGNOSIS — D509 Iron deficiency anemia, unspecified: Secondary | ICD-10-CM | POA: Insufficient documentation

## 2020-09-12 DIAGNOSIS — F32A Depression, unspecified: Secondary | ICD-10-CM | POA: Diagnosis not present

## 2020-09-12 DIAGNOSIS — I10 Essential (primary) hypertension: Secondary | ICD-10-CM | POA: Insufficient documentation

## 2020-09-12 DIAGNOSIS — E119 Type 2 diabetes mellitus without complications: Secondary | ICD-10-CM | POA: Insufficient documentation

## 2020-09-12 DIAGNOSIS — R0602 Shortness of breath: Secondary | ICD-10-CM | POA: Insufficient documentation

## 2020-09-12 DIAGNOSIS — I5032 Chronic diastolic (congestive) heart failure: Secondary | ICD-10-CM | POA: Diagnosis not present

## 2020-09-12 DIAGNOSIS — R5383 Other fatigue: Secondary | ICD-10-CM | POA: Diagnosis not present

## 2020-09-12 DIAGNOSIS — Z7984 Long term (current) use of oral hypoglycemic drugs: Secondary | ICD-10-CM | POA: Diagnosis not present

## 2020-09-12 DIAGNOSIS — D500 Iron deficiency anemia secondary to blood loss (chronic): Secondary | ICD-10-CM

## 2020-09-12 LAB — CBC WITH DIFFERENTIAL/PLATELET
Abs Immature Granulocytes: 0.05 10*3/uL (ref 0.00–0.07)
Basophils Absolute: 0.1 10*3/uL (ref 0.0–0.1)
Basophils Relative: 1 %
Eosinophils Absolute: 0.3 10*3/uL (ref 0.0–0.5)
Eosinophils Relative: 2 %
HCT: 34 % — ABNORMAL LOW (ref 36.0–46.0)
Hemoglobin: 10.8 g/dL — ABNORMAL LOW (ref 12.0–15.0)
Immature Granulocytes: 0 %
Lymphocytes Relative: 15 %
Lymphs Abs: 1.7 10*3/uL (ref 0.7–4.0)
MCH: 28.3 pg (ref 26.0–34.0)
MCHC: 31.8 g/dL (ref 30.0–36.0)
MCV: 89.2 fL (ref 80.0–100.0)
Monocytes Absolute: 0.9 10*3/uL (ref 0.1–1.0)
Monocytes Relative: 7 %
Neutro Abs: 8.6 10*3/uL — ABNORMAL HIGH (ref 1.7–7.7)
Neutrophils Relative %: 75 %
Platelets: 164 10*3/uL (ref 150–400)
RBC: 3.81 MIL/uL — ABNORMAL LOW (ref 3.87–5.11)
RDW: 14.6 % (ref 11.5–15.5)
WBC: 11.6 10*3/uL — ABNORMAL HIGH (ref 4.0–10.5)
nRBC: 0 % (ref 0.0–0.2)

## 2020-09-12 LAB — IRON AND TIBC
Iron: 22 ug/dL — ABNORMAL LOW (ref 28–170)
Saturation Ratios: 9 % — ABNORMAL LOW (ref 10.4–31.8)
TIBC: 255 ug/dL (ref 250–450)
UIBC: 233 ug/dL

## 2020-09-12 LAB — FERRITIN: Ferritin: 251 ng/mL (ref 11–307)

## 2020-09-13 ENCOUNTER — Encounter: Payer: Self-pay | Admitting: Oncology

## 2020-09-13 ENCOUNTER — Inpatient Hospital Stay: Payer: Medicare HMO

## 2020-09-13 ENCOUNTER — Inpatient Hospital Stay: Payer: Medicare HMO | Admitting: Oncology

## 2020-09-13 VITALS — BP 106/58 | HR 77 | Temp 98.9°F | Resp 18

## 2020-09-13 DIAGNOSIS — R5383 Other fatigue: Secondary | ICD-10-CM

## 2020-09-13 DIAGNOSIS — D631 Anemia in chronic kidney disease: Secondary | ICD-10-CM | POA: Diagnosis not present

## 2020-09-13 DIAGNOSIS — N1831 Chronic kidney disease, stage 3a: Secondary | ICD-10-CM | POA: Diagnosis not present

## 2020-09-13 DIAGNOSIS — D500 Iron deficiency anemia secondary to blood loss (chronic): Secondary | ICD-10-CM

## 2020-09-13 DIAGNOSIS — D509 Iron deficiency anemia, unspecified: Secondary | ICD-10-CM | POA: Diagnosis not present

## 2020-09-13 NOTE — Progress Notes (Signed)
Hematology/Oncology follow up note [facility] [phone] [phone]   Patient Care Team: Rusty Aus, MD as PCP - General (Internal Medicine) [name], MD as PCP - Cardiology (Cardiology) [name], MD as Consulting Physician (Cardiology) Gabriel Carina Betsey Holiday, MD as Physician Assistant (Internal Medicine) [name], MD as Consulting Physician (Cardiothoracic Surgery) Earlie Server, MD as Consulting Physician (Hematology and Oncology)  REFERRING PROVIDER: [place] CHIEF COMPLAINTS/REASON FOR VISIT:  Follow up  of iron deficiency anemia  HISTORY OF PRESENTING ILLNESS:  [name] is a  74 y.o.  female with PMH listed below was seen in consultation at the request of [place]   for evaluation of iron deficiency anemia.   Reviewed patient's recent labs  01/17/2019 labs revealed anemia with hemoglobin of 8.5, MCV 75.3. Reviewed patient's previous labs ordered by primary care physician's office, anemia is chronic onset , duration is since 2009.  Patient presented to emergency room on 01/17/2019 for evaluation of fatigue, patient is currently on a 10-day course of doxycycline prescribed by emergency room. She was also recently admitted between 01/07/2019 - 01/08/2019 due to parotitis submandibular gland inflammation.  Received a Decadron and Unasyn.  During that admission, patient received IV Venofer treatments.   Associated signs and symptoms: Patient reports fatigue.  Associated with SOB with exertion.  Denies weight loss, easy bruising, hematochezia, hemoptysis, hematuria. Context:  History of iron deficiency: History of iron deficiency. Rectal bleeding: Denies any blood in the stool. Patient had a history of GI bleeding. Last endoscopy: 11/08/2012 colonoscopy a single solitary 12 mm ulcer was found in the descending colon.  A lot of blood suctioned from colon.  Some bleeding was present.  Stigmata  of recent bleeding was present.  Area was successfully injected with 6 mL of a 1-10,000 solution of epinephrine for drug delivery.  1 large hemostatic clip was successfully placed.  Single large mouth diverticulum was found in the sigmoid colon.  Area was successfully injected with 3 mils of 1-10,000 solution of epinephrine.  Benign polyps were removed. 01/28/2013 EGD by Dr. Jamal Collin showed erythematous mucosa in the antrum.  Esophageal.  Mucosal changes suspicious for Barrett esophagus.  Biopsy showed antral mucosa with moderate reactive gastropathy.  No intestinal metaplasia, dysplasia or malignancy.  No H. pylori.  GE junction biopsy showed stratified squamous epithelium without inflammation or reactive changes.  And a single tiny focus of columnar epithelium most compatible with an esophageal clot.  No interstitial metaplasia or dysplasia or malignancy.    INTERVAL HISTORY [name] is a 75 y.o. female who has above history reviewed by me today presents for follow up visit for management of iron deficiency anemia.  Problems and complaints are listed below: Patient was recently admitted due to acute on chronic CHF exacerbation.  Today she still feels shortness of breath.  She was seen by primary care provider post discharge yesterday.    Review of Systems  Constitutional: Positive for fatigue. Negative for appetite change, chills and fever.  HENT:   Positive for hearing loss. Negative for voice change.   Eyes: Negative for eye problems.  Respiratory: Positive for shortness of breath. Negative for chest tightness and cough.   Cardiovascular: Negative for chest pain.  Gastrointestinal: Negative for abdominal distention, abdominal pain and blood in stool.  Endocrine: Negative for hot flashes.  Genitourinary: Negative for difficulty urinating and frequency.   Musculoskeletal: Negative for arthralgias.  Skin: Negative for itching and rash.  Neurological: Negative for extremity weakness.   Hematological: Negative for adenopathy.  Psychiatric/Behavioral: Negative for confusion.    MEDICAL HISTORY:  Past Medical History:  Diagnosis Date  . 1st degree AV block   . ACE-inhibitor cough   . Allergic rhinitis   . Anemia    iron deficiency anemia  . Anxiety   . Aortic ectasia (HCC)    a. CT abd in 12/2016 incidentally noted aortic atherosclerosis and infrarenal abdominal aortic ectasia measuring as large as 2.7 cm with recommendation to repeat US in 2023.  Marland Kitchen Arthritis   . Asthma   . Cataract   . Chronic depression   . Chronic diastolic CHF (congestive heart failure) ([place])   . Chronic headache   . COPD (chronic obstructive pulmonary disease) ([place])   . Coronary artery disease    a. DES to RCA and mid Cx 2009. b. CABG and bioprosthetic AVR May 2015. c. cutting balloon to prox Cx in 05/2016  . Diabetes mellitus    type 2  . Diverticulitis of colon   . Essential hypertension   . GERD (gastroesophageal reflux disease)   . Hearing loss   . History of blood transfusion 2013  . History of prosthetic aortic valve replacement   . HOH (hard of hearing)   . Hypercholesterolemia    intolerance of statins and niaspan  . IDA (iron deficiency anemia) 02/03/2019  . Mobitz type 1 second degree AV block   . OSA (obstructive sleep apnea)    mild, intolerant of cpap  . PAD (peripheral artery disease) ([place])    a. atherosclerosis by CT abd 12/2016 in [place].  Marland Kitchen PONV (postoperative nausea and vomiting)   . Statin intolerance   . Thyroid disease     SURGICAL HISTORY: Past Surgical History:  Procedure Laterality Date  . ABDOMINAL HYSTERECTOMY    . ABDOMINAL HYSTERECTOMY W/ PARTIAL VAGINACTOMY    . AORTIC VALVE REPLACEMENT N/A 10/12/2013   Procedure: AORTIC VALVE REPLACEMENT (AVR);  Surgeon: [name], MD;  Location: [place];  Service: Open Heart Surgery;  Laterality: N/A;  . [place]  . BARTHOLIN GLAND CYST EXCISION    . BLADDER SUSPENSION    . BREAST BIOPSY Bilateral  09/11/2000   neg  . BREAST BIOPSY Left 07/24/2010   neg  . BREAST CYST EXCISION  1988   bilateral nonmalignant tumors, x3  . CARDIAC CATHETERIZATION    . CARDIAC CATHETERIZATION N/A 05/25/2016   Procedure: Coronary Balloon Angioplasty;  Surgeon: Leonie Man, MD;  Location: [place] CV LAB;  Service: Cardiovascular;  Laterality: N/A;  . CARDIAC CATHETERIZATION N/A 05/25/2016   Procedure: Coronary/Graft Angiography;  Surgeon: Leonie Man, MD;  Location: [place] CV LAB;  Service: Cardiovascular;  Laterality: N/A;  . CATARACT EXTRACTION W/ INTRAOCULAR LENS  IMPLANT, BILATERAL    . CHOLECYSTECTOMY  2001  . COLECTOMY     lap sigmoid  . COLONOSCOPY  2014   polyps found, 2 clamped off.  . CORONARY ANGIOPLASTY  10/29/2007   Prox RCA & Mid Cx.  . CORONARY ARTERY BYPASS GRAFT N/A 10/12/2013   Procedure: CORONARY ARTERY BYPASS GRAFT TIMES TWO;  Surgeon: [name], MD;  Location: [place];  Service: Open Heart Surgery;  Laterality: N/A;  . CORONARY/GRAFT ANGIOGRAPHY N/A 09/20/2017   Procedure: CORONARY/GRAFT ANGIOGRAPHY;  Surgeon: Sherren Mocha, MD;  Location: [place] [facility];  Service: Cardiovascular;  Laterality: N/A;  . LEFT HEART CATHETERIZATION WITH CORONARY ANGIOGRAM N/A 10/09/2013   Procedure: LEFT  HEART CATHETERIZATION WITH CORONARY ANGIOGRAM;  Surgeon: [name], MD;  Location: [facility] CATH LAB;  Service: Cardiovascular;  Laterality: N/A;  . STERNAL WIRES REMOVAL N/A 04/13/2014   Procedure: STERNAL WIRES REMOVAL;  Surgeon: [name], MD;  Location: [facility] OR;  Service: Thoracic;  Laterality: N/A;  . THYROIDECTOMY    . TONSILLECTOMY    . TUBAL LIGATION    . VAGINAL DELIVERY     3  . VISCERAL ARTERY INTERVENTION N/A 08/16/2016   Procedure: Visceral Artery Intervention;  Surgeon: Algernon Huxley, MD;  Location: [place] [facility];  Service: Cardiovascular;  Laterality: N/A;    SOCIAL HISTORY: Social History   Socioeconomic History  . Marital status: Married     Spouse name: Not on file  . Number of children: 3  . Years of education: Not on file  . Highest education level: Not on file  Occupational History  . Occupation: Retired    Fish farm manager: UNEMPLOYED    Comment: CNA  Tobacco Use  . Smoking status: Former Smoker    Packs/day: 0.50    Years: 30.00    Pack years: 15.00    Types: Cigarettes    Quit date: 10/02/2013    Years since quitting: 6.9  . Smokeless tobacco: Never Used  Vaping Use  . Vaping Use: Never used  Substance and Sexual Activity  . Alcohol use: No  . Drug use: No  . Sexual activity: Not Currently  Other Topics Concern  . Not on file  Social History Narrative   Does not have Living Will   Desires CPR, would not want prolonged life support if futile.   Social Determinants of Health   Financial Resource Strain: Not on file  Food Insecurity: Not on file  Transportation Needs: Not on file  Physical Activity: Not on file  Stress: Not on file  Social Connections: Not on file  Intimate Partner Violence: Not on file    FAMILY HISTORY: Family History  Problem Relation Age of Onset  . Breast cancer Mother 96  . Hypertension Father   . Mesothelioma Father   . Asthma Father   . Stroke Paternal Grandfather   . Heart disease Other   . Breast cancer Maternal Aunt   . Breast cancer Paternal Aunt     ALLERGIES:  is allergic to amitriptyline, benadryl [diphenhydramine], demerol [meperidine], gabapentin, loratadine, meperidine hcl, mirtazapine, olanzapine, voltaren [diclofenac sodium], zetia [ezetimibe], ativan [lorazepam], atorvastatin, budesonide-formoterol fumarate, bupropion hcl, caffeine, codeine sulfate, lisinopril, metformin, mometasone furoate, morphine sulfate, other, oxycodone-acetaminophen, pioglitazone, propoxyphene n-acetaminophen, rosuvastatin, shellfish allergy, suvorexant, ticagrelor, tramadol, trazodone and nefazodone, venlafaxine, zolpidem tartrate, and latex.  MEDICATIONS:  Current Outpatient Medications   Medication Sig Dispense Refill  . albuterol (PROVENTIL) (2.5 MG/3ML) 0.083% nebulizer solution Take 2.5 mg by nebulization every 4 (four) hours as needed.    . ARIPiprazole (ABILIFY) 2 MG tablet Take 2 mg by mouth daily.    Marland Kitchen aspirin EC 81 MG EC tablet Take 1 tablet (81 mg total) by mouth daily. Swallow whole. 30 tablet 11  . bisoprolol (ZEBETA) 5 MG tablet Take 5 mg by mouth daily.    . Calcium-Vitamin D (CALTRATE 600 PLUS-VIT D PO) Take 1 tablet by mouth 2 (two) times daily.    . DULoxetine (CYMBALTA) 20 MG capsule Take 2 capsules (40 mg total) by mouth daily. 30 capsule 3  . FLUoxetine (PROZAC) 40 MG capsule Take 1 capsule by mouth 2 (two) times daily.    . furosemide (LASIX) 20 MG  tablet Take 1 tablet (20 mg total) by mouth daily. 30 tablet 4  . glipiZIDE (GLUCOTROL XL) 10 MG 24 hr tablet Take 10 mg by mouth daily.    . insulin NPH-regular Human (NOVOLIN 70/30) (70-30) 100 UNIT/ML injection Inject 60 Units into the skin 2 (two) times daily with a meal. Inject 70u under the skin every morning at breakfast and inject 70u under the skin every evening with supper    . levothyroxine (SYNTHROID, LEVOTHROID) 125 MCG tablet Take 125 mcg by mouth daily.     Marland Kitchen losartan (COZAAR) 25 MG tablet Take 1 tablet (25 mg total) by mouth daily. 30 tablet 0  . pantoprazole (PROTONIX) 20 MG tablet Take 20 mg by mouth daily.    . potassium chloride (KLOR-CON) 10 MEQ tablet Take 1 tablet (10 mEq total) by mouth daily. 30 tablet 3  . promethazine (PHENERGAN) 25 MG tablet Take 25 mg by mouth every 6 (six) hours as needed for nausea or vomiting.    . traZODone (DESYREL) 100 MG tablet Take 100 mg by mouth at bedtime.    . nitroGLYCERIN (NITROSTAT) 0.4 MG SL tablet Place 1 tablet (0.4 mg total) under the tongue every 5 (five) minutes as needed for chest pain. Please make overdue appt with Dr. Angelena Form before anymore refills. 1st attempt (Patient not taking: Reported on 09/13/2020) 30 tablet 0   No current  facility-administered medications for this visit.     PHYSICAL EXAMINATION: ECOG PERFORMANCE STATUS: 1 - Symptomatic but completely ambulatory Vitals:   09/13/20 1341  BP: (!) 106/58  Pulse: 77  Resp: 18  Temp: 98.9 F (37.2 C)   Filed Weights   Physical Exam Constitutional:      General: She is not in acute distress.    Appearance: She is obese.  HENT:     Head: Normocephalic and atraumatic.  Eyes:     General: No scleral icterus.    Pupils: Pupils are equal, round, and reactive to light.  Cardiovascular:     Rate and Rhythm: Normal rate and regular rhythm.     Heart sounds: Murmur heard.    Pulmonary:     Effort: Pulmonary effort is normal. No respiratory distress.     Breath sounds: No wheezing.  Abdominal:     General: Bowel sounds are normal. There is no distension.     Palpations: Abdomen is soft. There is no mass.     Tenderness: There is no abdominal tenderness.  Musculoskeletal:        General: No deformity. Normal range of motion.     Cervical back: Normal range of motion and neck supple.     Comments: Bilateral lower extremity trace edema  Skin:    General: Skin is warm and dry.     Coloration: Skin is not pale.     Findings: No erythema or rash.  Neurological:     Mental Status: She is alert and oriented to person, place, and time. Mental status is at baseline.     Cranial Nerves: No cranial nerve deficit.     Coordination: Coordination normal.  Psychiatric:        Mood and Affect: Mood normal.       CMP Latest Ref Rng & Units 09/08/2020  Glucose 70 - 99 mg/dL 170(H)  BUN 8 - 23 mg/dL 18  Creatinine 0.44 - 1.00 mg/dL 0.99  Sodium 135 - 145 mmol/L 139  Potassium 3.5 - 5.1 mmol/L 3.4(L)  Chloride 98 - 111 mmol/L 99  CO2 22 - 32 mmol/L 30  Calcium 8.9 - 10.3 mg/dL 8.4(L)  Total Protein 6.5 - 8.1 g/dL -  Total Bilirubin 0.3 - 1.2 mg/dL -  Alkaline Phos 38 - 126 U/L -  AST 15 - 41 U/L -  ALT 0 - 44 U/L -   CBC Latest Ref Rng & Units  09/12/2020  WBC 4.0 - 10.5 K/uL 11.6(H)  Hemoglobin 12.0 - 15.0 g/dL 10.8(L)  Hematocrit 36.0 - 46.0 % 34.0(L)  Platelets 150 - 400 K/uL 164     LABORATORY DATA:  I have reviewed the data as listed Lab Results  Component Value Date   WBC 11.6 (H) 09/12/2020   HGB 10.8 (L) 09/12/2020   HCT 34.0 (L) 09/12/2020   MCV 89.2 09/12/2020   PLT 164 09/12/2020   Recent Labs    06/15/20 0957 08/08/20 0720 08/09/20 0352 09/07/20 1953 09/08/20 0525  NA 136   < > 136 134* 139  K 3.6   < > 4.0 4.0 3.4*  CL 100   < > 104 103 99  CO2 24   < > 25 21* 30  GLUCOSE 227*   < > 206* 334* 170*  BUN 14   < > 18 19 18   CREATININE 1.02*   < > 1.05* 1.08* 0.99  CALCIUM 8.6*   < > 8.6* 8.4* 8.4*  GFRNONAA 57*   < > 55* 53* 59*  PROT 6.7  --   --   --   --   ALBUMIN 2.9*  --   --   --   --   AST 18  --   --   --   --   ALT 15  --   --   --   --   ALKPHOS 94  --   --   --   --   BILITOT 0.5  --   --   --   --    < > = values in this interval not displayed.   Iron/TIBC/Ferritin/ %Sat    Component Value Date/Time   IRON 22 (L) 09/12/2020 1226   IRON 41 (L) 03/31/2014 1417   TIBC 255 09/12/2020 1226   TIBC 264 03/31/2014 1417   FERRITIN 251 09/12/2020 1226   FERRITIN 406 (H) 03/31/2014 1417   IRONPCTSAT 9 (L) 09/12/2020 1226   IRONPCTSAT 16 03/31/2014 1417     DG Chest 2 View  Result Date: 09/07/2020 CLINICAL DATA:  Chest pain EXAM: CHEST - 2 VIEW COMPARISON:  08/08/2020 FINDINGS: Prior median sternotomy and valve replacement. Cardiomegaly, vascular congestion. Interstitial prominence again noted, slightly increasing since prior study, likely interstitial edema. No effusions. No acute bony abnormality. IMPRESSION: Cardiomegaly with vascular congestion and worsening interstitial prominence, likely edema. Electronically Signed   By: Rolm Baptise M.D.   On: 09/07/2020 20:02      ASSESSMENT & PLAN:  1. Iron deficiency anemia due to chronic blood loss   2. Anemia due to stage 3a chronic kidney  disease (HCC)   3. Other fatigue    #Iron deficiency anemia/anemia of chronic disease  labs reviewed and discussed with patient. Hemoglobin 10.8, iron saturation is adequate.  Patient has previously received IV Venofer treatments. No need for additional IV Venofer treatment today.  Ferritin is above 200. Anemia is most likely due to chronic disease  Hemoglobin is above 10, no need for erythropoietin replacement therapy at this point. Recommend patient to closely follow-up with primary care and cardiology for her heart issue. Marland Kitchen  Orders Placed This Encounter  Procedures  . CBC with Differential/Platelet    Standing Status:   Future    Standing Expiration Date:   09/13/2021  . Ferritin    Standing Status:   Future    Standing Expiration Date:   09/13/2021  . Iron and TIBC    Standing Status:   Future    Standing Expiration Date:   09/13/2021  . Retic Panel    Standing Status:   Future    Standing Expiration Date:   09/13/2021    All questions were answered. The patient knows to call the clinic with any problems questions or concerns.  Return of visit: 2 months   Earlie Server, MD, PhD Hematology Oncology [facility] Pager- [phone] 09/13/2020

## 2020-09-13 NOTE — Progress Notes (Signed)
Pt here for follow up. No new concerns voiced.   

## 2020-09-14 ENCOUNTER — Ambulatory Visit: Payer: Medicare HMO | Admitting: Family

## 2020-09-29 NOTE — Telephone Encounter (Signed)
Scheduled

## 2020-11-02 ENCOUNTER — Ambulatory Visit: Payer: Medicare HMO | Admitting: Physician Assistant

## 2020-11-03 ENCOUNTER — Encounter: Payer: Self-pay | Admitting: Internal Medicine

## 2020-11-03 ENCOUNTER — Encounter
Admission: RE | Disposition: A | Discharge status: Home / Self Care | Payer: Self-pay | Source: Home / Self Care | Attending: Internal Medicine

## 2020-11-03 ENCOUNTER — Ambulatory Visit
Admission: RE | Admit: 2020-11-03 | Discharge: 2020-11-03 | Disposition: A | Discharge status: Home / Self Care | Payer: Medicare HMO | Attending: Internal Medicine | Admitting: Internal Medicine

## 2020-11-03 ENCOUNTER — Ambulatory Visit: Payer: Medicare HMO | Admitting: Anesthesiology

## 2020-11-03 ENCOUNTER — Ambulatory Visit
Admission: RE | Admit: 2020-11-03 | Discharge: 2020-11-03 | Disposition: A | Discharge status: Home / Self Care | Payer: Medicare HMO | Source: Home / Self Care | Attending: Internal Medicine | Admitting: Internal Medicine

## 2020-11-03 ENCOUNTER — Other Ambulatory Visit: Payer: Self-pay

## 2020-11-03 DIAGNOSIS — Z951 Presence of aortocoronary bypass graft: Secondary | ICD-10-CM | POA: Insufficient documentation

## 2020-11-03 DIAGNOSIS — E119 Type 2 diabetes mellitus without complications: Secondary | ICD-10-CM | POA: Insufficient documentation

## 2020-11-03 DIAGNOSIS — E785 Hyperlipidemia, unspecified: Secondary | ICD-10-CM | POA: Insufficient documentation

## 2020-11-03 DIAGNOSIS — I2510 Atherosclerotic heart disease of native coronary artery without angina pectoris: Secondary | ICD-10-CM | POA: Diagnosis not present

## 2020-11-03 DIAGNOSIS — I2582 Chronic total occlusion of coronary artery: Secondary | ICD-10-CM | POA: Diagnosis not present

## 2020-11-03 DIAGNOSIS — Z952 Presence of prosthetic heart valve: Secondary | ICD-10-CM | POA: Insufficient documentation

## 2020-11-03 DIAGNOSIS — I700 Atherosclerosis of aorta: Secondary | ICD-10-CM | POA: Diagnosis not present

## 2020-11-03 DIAGNOSIS — I10 Essential (primary) hypertension: Secondary | ICD-10-CM | POA: Insufficient documentation

## 2020-11-03 DIAGNOSIS — I272 Pulmonary hypertension, unspecified: Secondary | ICD-10-CM | POA: Diagnosis not present

## 2020-11-03 DIAGNOSIS — I209 Angina pectoris, unspecified: Secondary | ICD-10-CM | POA: Diagnosis not present

## 2020-11-03 DIAGNOSIS — I350 Nonrheumatic aortic (valve) stenosis: Secondary | ICD-10-CM | POA: Insufficient documentation

## 2020-11-03 HISTORY — PX: RIGHT/LEFT HEART CATH AND CORONARY ANGIOGRAPHY: CATH118266

## 2020-11-03 HISTORY — PX: TEE WITHOUT CARDIOVERSION: SHX5443

## 2020-11-03 LAB — GLUCOSE, CAPILLARY: Glucose-Capillary: 167 mg/dL — ABNORMAL HIGH (ref 70–99)

## 2020-11-03 SURGERY — RIGHT/LEFT HEART CATH AND CORONARY ANGIOGRAPHY
Anesthesia: Moderate Sedation

## 2020-11-03 SURGERY — ECHOCARDIOGRAM, TRANSESOPHAGEAL
Anesthesia: General

## 2020-11-03 MED ORDER — ASPIRIN 81 MG PO CHEW
CHEWABLE_TABLET | ORAL | Status: AC
  Filled 2020-11-03: qty 1

## 2020-11-03 MED ORDER — LIDOCAINE VISCOUS HCL 2 % MT SOLN
OROMUCOSAL | Status: AC
  Filled 2020-11-03: qty 15

## 2020-11-03 MED ORDER — PROPOFOL 10 MG/ML IV BOLUS
INTRAVENOUS | Status: DC | PRN
  Administered 2020-11-03: 440 mg via INTRAVENOUS

## 2020-11-03 MED ORDER — LABETALOL HCL 5 MG/ML IV SOLN: 10.0000 mg | INTRAVENOUS | Status: DC | PRN

## 2020-11-03 MED ORDER — MIDAZOLAM HCL 2 MG/2ML IJ SOLN
INTRAMUSCULAR | Status: AC
  Filled 2020-11-03: qty 2

## 2020-11-03 MED ORDER — IOHEXOL 300 MG/ML  SOLN
INTRAMUSCULAR | Status: DC | PRN
Start: 2020-11-03 — End: 2020-11-03
  Administered 2020-11-03: 245 mL

## 2020-11-03 MED ORDER — LABETALOL HCL 5 MG/ML IV SOLN
INTRAVENOUS | Status: AC
  Filled 2020-11-03: qty 4

## 2020-11-03 MED ORDER — GLYCOPYRROLATE 0.2 MG/ML IJ SOLN
INTRAMUSCULAR | Status: AC
  Filled 2020-11-03: qty 1

## 2020-11-03 MED ORDER — GLYCOPYRROLATE 0.2 MG/ML IJ SOLN
INTRAMUSCULAR | Status: DC | PRN
  Administered 2020-11-03: .1 mg via INTRAVENOUS

## 2020-11-03 MED ORDER — FENTANYL CITRATE (PF) 100 MCG/2ML IJ SOLN
INTRAMUSCULAR | Status: DC | PRN
  Administered 2020-11-03 (×3): 25 ug via INTRAVENOUS

## 2020-11-03 MED ORDER — MIDAZOLAM HCL 2 MG/2ML IJ SOLN
INTRAMUSCULAR | Status: DC | PRN
  Administered 2020-11-03: 1 mg via INTRAVENOUS
  Administered 2020-11-03: .5 mg via INTRAVENOUS

## 2020-11-03 MED ORDER — HEPARIN (PORCINE) IN NACL 2000-0.9 UNIT/L-% IV SOLN
INTRAVENOUS | Status: DC | PRN
  Administered 2020-11-03: 1000 mL

## 2020-11-03 MED ORDER — PROPOFOL 10 MG/ML IV BOLUS
INTRAVENOUS | Status: AC
  Filled 2020-11-03: qty 20

## 2020-11-03 MED ORDER — ONDANSETRON HCL 4 MG/2ML IJ SOLN
INTRAMUSCULAR | Status: DC | PRN
  Administered 2020-11-03: 4 mg via INTRAVENOUS

## 2020-11-03 MED ORDER — FENTANYL CITRATE (PF) 100 MCG/2ML IJ SOLN
INTRAMUSCULAR | Status: DC | PRN
  Administered 2020-11-03: 25 ug via INTRAVENOUS

## 2020-11-03 MED ORDER — SODIUM CHLORIDE 0.9 % WEIGHT BASED INFUSION
3.0000 mL/kg/h | INTRAVENOUS | Status: AC
  Administered 2020-11-03: 3 mL/kg/h via INTRAVENOUS

## 2020-11-03 MED ORDER — LIDOCAINE HCL (PF) 1 % IJ SOLN
INTRAMUSCULAR | Status: AC
  Filled 2020-11-03: qty 30

## 2020-11-03 MED ORDER — ISOSORBIDE MONONITRATE ER 30 MG PO TB24: 30.0000 mg | ORAL_TABLET | Freq: Every day | ORAL | 11 refills | Status: AC

## 2020-11-03 MED ORDER — SODIUM CHLORIDE 0.9 % WEIGHT BASED INFUSION
1.0000 mL/kg/h | INTRAVENOUS | Status: DC
  Administered 2020-11-03: 1 mL/kg/h via INTRAVENOUS

## 2020-11-03 MED ORDER — SODIUM CHLORIDE FLUSH 0.9 % IV SOLN
INTRAVENOUS | Status: AC
  Filled 2020-11-03: qty 10

## 2020-11-03 MED ORDER — PROPOFOL 500 MG/50ML IV EMUL
INTRAVENOUS | Status: AC
  Filled 2020-11-03: qty 50

## 2020-11-03 MED ORDER — HYDRALAZINE HCL 20 MG/ML IJ SOLN: 10.0000 mg | INTRAMUSCULAR | Status: DC | PRN

## 2020-11-03 MED ORDER — ONDANSETRON HCL 4 MG/2ML IJ SOLN: 4.0000 mg | Freq: Four times a day (QID) | INTRAMUSCULAR | Status: DC | PRN

## 2020-11-03 MED ORDER — ONDANSETRON HCL 4 MG/2ML IJ SOLN
INTRAMUSCULAR | Status: AC
  Filled 2020-11-03: qty 2

## 2020-11-03 MED ORDER — FENTANYL CITRATE (PF) 100 MCG/2ML IJ SOLN
INTRAMUSCULAR | Status: AC
  Filled 2020-11-03: qty 2

## 2020-11-03 MED ORDER — BUTAMBEN-TETRACAINE-BENZOCAINE 2-2-14 % EX AERO
INHALATION_SPRAY | CUTANEOUS | Status: AC
  Filled 2020-11-03: qty 5

## 2020-11-03 MED ORDER — SODIUM CHLORIDE 0.9 % IV SOLN: 250.0000 mL | INTRAVENOUS | Status: DC | PRN

## 2020-11-03 MED ORDER — SODIUM CHLORIDE 0.9% FLUSH: 3.0000 mL | INTRAVENOUS | Status: DC | PRN

## 2020-11-03 MED ORDER — MIDAZOLAM HCL 2 MG/2ML IJ SOLN
INTRAMUSCULAR | Status: DC | PRN
  Administered 2020-11-03: 1 mg via INTRAVENOUS

## 2020-11-03 MED ORDER — LABETALOL HCL 5 MG/ML IV SOLN
INTRAVENOUS | Status: DC | PRN
  Administered 2020-11-03: 20 mg via INTRAVENOUS

## 2020-11-03 MED ORDER — SODIUM CHLORIDE 0.9 % IV SOLN: INTRAVENOUS | Status: DC

## 2020-11-03 MED ORDER — SODIUM CHLORIDE (PF) 0.9 % IJ SOLN
INTRAMUSCULAR | Status: AC
  Filled 2020-11-03: qty 30

## 2020-11-03 MED ORDER — ACETAMINOPHEN 325 MG PO TABS: 650.0000 mg | ORAL_TABLET | ORAL | Status: DC | PRN

## 2020-11-03 MED ORDER — SODIUM CHLORIDE 0.9 % WEIGHT BASED INFUSION: 1.0000 mL/kg/h | INTRAVENOUS | Status: DC

## 2020-11-03 MED ORDER — PHENYLEPHRINE HCL (PRESSORS) 10 MG/ML IV SOLN
INTRAVENOUS | Status: DC | PRN
  Administered 2020-11-03: 100 ug via INTRAVENOUS

## 2020-11-03 MED ORDER — PHENYLEPHRINE HCL (PRESSORS) 10 MG/ML IV SOLN
INTRAVENOUS | Status: AC
  Filled 2020-11-03: qty 1

## 2020-11-03 MED ORDER — ASPIRIN 81 MG PO CHEW: 81.0000 mg | CHEWABLE_TABLET | ORAL | Status: DC

## 2020-11-03 MED ORDER — SODIUM CHLORIDE 0.9% FLUSH: 3.0000 mL | Freq: Two times a day (BID) | INTRAVENOUS | Status: DC

## 2020-11-03 MED ORDER — LIDOCAINE HCL (PF) 1 % IJ SOLN
INTRAMUSCULAR | Status: DC | PRN
  Administered 2020-11-03: 20 mL

## 2020-11-03 SURGICAL SUPPLY — 18 items
CATH INFINITI 5 FR 3DRC (CATHETERS) ×1 IMPLANT
CATH INFINITI 5 FR IM (CATHETERS) ×1 IMPLANT
CATH INFINITI 5 FR RCB (CATHETERS) ×1 IMPLANT
CATH INFINITI 5FR JL4 (CATHETERS) ×1 IMPLANT
CATH INFINITI 5FR JL5 (CATHETERS) ×1 IMPLANT
CATH INFINITI JR4 5F (CATHETERS) ×1 IMPLANT
CATH SWAN GANZ 7F STRAIGHT (CATHETERS) ×1 IMPLANT
DEVICE CLOSURE MYNXGRIP 5F (Vascular Products) ×1 IMPLANT
NDL PERC 18GX7CM (NEEDLE) IMPLANT
NEEDLE PERC 18GX7CM (NEEDLE) ×2 IMPLANT
PACK CARDIAC CATH (CUSTOM PROCEDURE TRAY) ×2 IMPLANT
PROTECTION STATION PRESSURIZED (MISCELLANEOUS) ×2
SET ATX SIMPLICITY (MISCELLANEOUS) ×1 IMPLANT
SHEATH AVANTI 5FR X 11CM (SHEATH) ×1 IMPLANT
SHEATH AVANTI 7FRX11 (SHEATH) ×1 IMPLANT
STATION PROTECTION PRESSURIZED (MISCELLANEOUS) IMPLANT
WIRE EMERALD 3MM-J .035X260CM (WIRE) ×1 IMPLANT
WIRE GUIDERIGHT .035X150 (WIRE) ×1 IMPLANT

## 2020-11-03 NOTE — Progress Notes (Signed)
*  PRELIMINARY RESULTS* Echocardiogram Echocardiogram Transesophageal has been performed.  [name] 11/03/2020, 8:38 AM

## 2020-11-03 NOTE — Transfer of Care (Signed)
Immediate Anesthesia Transfer of Care Note  Patient: [name]  Procedure(s) Performed: TRANSESOPHAGEAL ECHOCARDIOGRAM (TEE) (N/A )  Patient Location: Special Procedures  Anesthesia Type:General  Level of Consciousness: drowsy and patient cooperative  Airway & Oxygen Therapy: Patient Spontanous Breathing and Patient connected to nasal cannula oxygen  Post-op Assessment: Report given to RN and Post -op Vital signs reviewed and stable  Post vital signs: Reviewed and stable  Last Vitals:  Vitals Value Taken Time  BP 130/104 (110)   Temp    Pulse 98   Resp 16   SpO2 94% on RA     Last Pain:  Vitals:   11/03/20 0656  TempSrc: Oral  PainSc: 2          Complications: No complications documented.

## 2020-11-03 NOTE — Anesthesia Preprocedure Evaluation (Signed)
Anesthesia Evaluation    History of Anesthesia Complications (+) PONV and history of anesthetic complications  Airway Mallampati: III  TM Distance: >3 FB Neck ROM: Limited    Dental  (+) Upper Dentures, Lower Dentures   Pulmonary shortness of breath and with exertion, asthma , sleep apnea , COPD,  oxygen dependent, Patient abstained from smoking.Not current smoker, former smoker,  Has O2 at home but uses it intermittently, on exertion   Pulmonary exam normal breath sounds clear to auscultation       Cardiovascular Exercise Tolerance: Poor hypertension, + CAD, + Past MI, + CABG and +CHF  + Valvular Problems/Murmurs MR and AS  Rhythm:Regular Rate:Normal - Systolic murmurs S/p CABG and prior AVR  TTE 2021: NORMAL LEFT VENTRICULAR SYSTOLIC FUNCTION with Moderate to Severe LVH, estimated EF > 55%  NORMAL RIGHT VENTRICULAR SYSTOLIC FUNCTION  Moderate LAE  Moderate Mitral valve stenosis = 35mmHg Mean  Mild PHTN  Moderate to Severe Bioprosthetic Aortic valvestenosis  AVA(VTI) 1.1cm^2     Neuro/Psych  Headaches, PSYCHIATRIC DISORDERS Anxiety Depression    GI/Hepatic GERD  ,  Endo/Other  diabetesHypothyroidism   Renal/GU CRFRenal disease     Musculoskeletal   Abdominal   Peds  Hematology   Anesthesia Other Findings Past Medical History: No date: 1st degree AV block No date: ACE-inhibitor cough No date: Allergic rhinitis No date: Anemia     Comment:  iron deficiency anemia No date: Anxiety No date: Aortic ectasia (HCC)     Comment:  a. CT abd in 12/2016 incidentally noted aortic               atherosclerosis and infrarenal abdominal aortic ectasia               measuring as large as 2.7 cm with recommendation to               repeat US in 2023. No date: Arthritis No date: Asthma No date: Cataract No date: Chronic depression No date: Chronic diastolic CHF (congestive heart failure) (HCC) No date: Chronic  headache No date: COPD (chronic obstructive pulmonary disease) ([place]) No date: Coronary artery disease     Comment:  a. DES to RCA and mid Cx 2009. b. CABG and bioprosthetic              AVR May 2015. c. cutting balloon to prox Cx in 05/2016 No date: Diabetes mellitus     Comment:  type 2 No date: Diverticulitis of colon No date: Essential hypertension No date: GERD (gastroesophageal reflux disease) No date: Hearing loss 2013: History of blood transfusion No date: History of prosthetic aortic valve replacement No date: HOH (hard of hearing) No date: Hypercholesterolemia     Comment:  intolerance of statins and niaspan 02/03/2019: IDA (iron deficiency anemia) No date: Mobitz type 1 second degree AV block No date: OSA (obstructive sleep apnea)     Comment:  mild, intolerant of cpap No date: PAD (peripheral artery disease) ([place])     Comment:  a. atherosclerosis by CT abd 12/2016 in [place]. No date: PONV (postoperative nausea and vomiting) No date: Statin intolerance No date: Thyroid disease   Reproductive/Obstetrics                            Anesthesia Physical Anesthesia Plan  ASA: III  Anesthesia Plan: General   Post-op Pain Management:    Induction: Intravenous  PONV Risk Score and Plan: 4 or greater and  Ondansetron, Propofol infusion, TIVA and Treatment may vary due to age or medical condition  Airway Management Planned: Natural Airway  Additional Equipment: None  Intra-op Plan:   Post-operative Plan:   Informed Consent: I have reviewed the patients History and Physical, chart, labs and discussed the procedure including the risks, benefits and alternatives for the proposed anesthesia with the patient or authorized representative who has indicated his/her understanding and acceptance.     Dental advisory given  Plan Discussed with: CRNA and Surgeon  Anesthesia Plan Comments: (Discussed risks of anesthesia with patient, including possibility of  difficulty with spontaneous ventilation under anesthesia necessitating airway intervention, PONV, and rare risks such as cardiac or respiratory or neurological events. Patient counseled on being higher risk for anesthesia due to comorbidities: severe aortic stenosis. Patient was told about increased risk of cardiac and respiratory events, including death. Patient understands. Patient understands.)        Anesthesia Quick Evaluation

## 2020-11-03 NOTE — Anesthesia Postprocedure Evaluation (Signed)
Anesthesia Post Note  Patient: [name]  Procedure(s) Performed: TRANSESOPHAGEAL ECHOCARDIOGRAM (TEE) (N/A )  Patient location during evaluation: Specials Recovery Anesthesia Type: General Level of consciousness: awake and alert Pain management: pain level controlled Vital Signs Assessment: post-procedure vital signs reviewed and stable Respiratory status: spontaneous breathing, nonlabored ventilation, respiratory function stable and patient connected to nasal cannula oxygen Cardiovascular status: blood pressure returned to baseline and stable Postop Assessment: no apparent nausea or vomiting Anesthetic complications: no   No complications documented.   Last Vitals:  Vitals:   11/03/20 0656  BP: (!) 170/85  Pulse: 76  Resp: 18  Temp: 37 C  SpO2: 98%    Last Pain:  Vitals:   11/03/20 0656  TempSrc: Oral  PainSc: 2                  Arita Miss

## 2020-11-05 ENCOUNTER — Emergency Department: Payer: Medicare HMO

## 2020-11-05 ENCOUNTER — Emergency Department
Admission: EM | Admit: 2020-11-05 | Discharge: 2020-11-05 | Disposition: A | Discharge status: Home / Self Care | Payer: Medicare HMO | Attending: Emergency Medicine | Admitting: Emergency Medicine

## 2020-11-05 DIAGNOSIS — I130 Hypertensive heart and chronic kidney disease with heart failure and stage 1 through stage 4 chronic kidney disease, or unspecified chronic kidney disease: Secondary | ICD-10-CM | POA: Diagnosis not present

## 2020-11-05 DIAGNOSIS — Z951 Presence of aortocoronary bypass graft: Secondary | ICD-10-CM | POA: Insufficient documentation

## 2020-11-05 DIAGNOSIS — J45909 Unspecified asthma, uncomplicated: Secondary | ICD-10-CM | POA: Insufficient documentation

## 2020-11-05 DIAGNOSIS — Z91040 Latex allergy status: Secondary | ICD-10-CM | POA: Insufficient documentation

## 2020-11-05 DIAGNOSIS — R0602 Shortness of breath: Secondary | ICD-10-CM | POA: Diagnosis present

## 2020-11-05 DIAGNOSIS — Z7984 Long term (current) use of oral hypoglycemic drugs: Secondary | ICD-10-CM | POA: Diagnosis not present

## 2020-11-05 DIAGNOSIS — I25110 Atherosclerotic heart disease of native coronary artery with unstable angina pectoris: Secondary | ICD-10-CM | POA: Diagnosis not present

## 2020-11-05 DIAGNOSIS — Z794 Long term (current) use of insulin: Secondary | ICD-10-CM | POA: Insufficient documentation

## 2020-11-05 DIAGNOSIS — N1831 Chronic kidney disease, stage 3a: Secondary | ICD-10-CM | POA: Diagnosis not present

## 2020-11-05 DIAGNOSIS — E1122 Type 2 diabetes mellitus with diabetic chronic kidney disease: Secondary | ICD-10-CM | POA: Diagnosis not present

## 2020-11-05 DIAGNOSIS — Z87891 Personal history of nicotine dependence: Secondary | ICD-10-CM | POA: Insufficient documentation

## 2020-11-05 DIAGNOSIS — Z7982 Long term (current) use of aspirin: Secondary | ICD-10-CM | POA: Diagnosis not present

## 2020-11-05 DIAGNOSIS — E119 Type 2 diabetes mellitus without complications: Secondary | ICD-10-CM

## 2020-11-05 DIAGNOSIS — Z79899 Other long term (current) drug therapy: Secondary | ICD-10-CM | POA: Insufficient documentation

## 2020-11-05 DIAGNOSIS — E039 Hypothyroidism, unspecified: Secondary | ICD-10-CM | POA: Diagnosis not present

## 2020-11-05 DIAGNOSIS — I5033 Acute on chronic diastolic (congestive) heart failure: Secondary | ICD-10-CM | POA: Insufficient documentation

## 2020-11-05 DIAGNOSIS — J449 Chronic obstructive pulmonary disease, unspecified: Secondary | ICD-10-CM | POA: Insufficient documentation

## 2020-11-05 DIAGNOSIS — I509 Heart failure, unspecified: Secondary | ICD-10-CM

## 2020-11-05 LAB — CBC WITH DIFFERENTIAL/PLATELET
Abs Immature Granulocytes: 0.08 10*3/uL — ABNORMAL HIGH (ref 0.00–0.07)
Basophils Absolute: 0.1 10*3/uL (ref 0.0–0.1)
Basophils Relative: 1 %
Eosinophils Absolute: 0.4 10*3/uL (ref 0.0–0.5)
Eosinophils Relative: 4 %
HCT: 29.3 % — ABNORMAL LOW (ref 36.0–46.0)
Hemoglobin: 9.5 g/dL — ABNORMAL LOW (ref 12.0–15.0)
Immature Granulocytes: 1 %
Lymphocytes Relative: 26 %
Lymphs Abs: 2.1 10*3/uL (ref 0.7–4.0)
MCH: 28.5 pg (ref 26.0–34.0)
MCHC: 32.4 g/dL (ref 30.0–36.0)
MCV: 88 fL (ref 80.0–100.0)
Monocytes Absolute: 0.5 10*3/uL (ref 0.1–1.0)
Monocytes Relative: 7 %
Neutro Abs: 5.1 10*3/uL (ref 1.7–7.7)
Neutrophils Relative %: 61 %
Platelets: 178 10*3/uL (ref 150–400)
RBC: 3.33 MIL/uL — ABNORMAL LOW (ref 3.87–5.11)
RDW: 13.7 % (ref 11.5–15.5)
WBC: 8.3 10*3/uL (ref 4.0–10.5)
nRBC: 0 % (ref 0.0–0.2)

## 2020-11-05 LAB — TROPONIN I (HIGH SENSITIVITY)
Troponin I (High Sensitivity): 32 ng/L — ABNORMAL HIGH (ref <18)
Troponin I (High Sensitivity): 37 ng/L — ABNORMAL HIGH (ref <18)

## 2020-11-05 LAB — BASIC METABOLIC PANEL
Anion gap: 11 (ref 5–15)
BUN: 13 mg/dL (ref 8–23)
CO2: 23 mmol/L (ref 22–32)
Calcium: 8.5 mg/dL — ABNORMAL LOW (ref 8.9–10.3)
Chloride: 101 mmol/L (ref 98–111)
Creatinine, Ser: 1.09 mg/dL — ABNORMAL HIGH (ref 0.44–1.00)
GFR, Estimated: 53 mL/min — ABNORMAL LOW (ref >60)
Glucose, Bld: 230 mg/dL — ABNORMAL HIGH (ref 70–99)
Potassium: 4.2 mmol/L (ref 3.5–5.1)
Sodium: 135 mmol/L (ref 135–145)

## 2020-11-05 LAB — BRAIN NATRIURETIC PEPTIDE: B Natriuretic Peptide: 231.2 pg/mL — ABNORMAL HIGH (ref 0.0–100.0)

## 2020-11-05 MED ORDER — FUROSEMIDE 10 MG/ML IJ SOLN
40.0000 mg | Freq: Once | INTRAMUSCULAR | Status: AC
  Administered 2020-11-05: 40 mg via INTRAVENOUS
  Filled 2020-11-05: qty 4

## 2020-11-05 MED ORDER — LORAZEPAM 0.5 MG PO TABS
0.5000 mg | ORAL_TABLET | Freq: Once | ORAL | Status: AC
  Administered 2020-11-05: 0.5 mg via ORAL
  Filled 2020-11-05: qty 1

## 2020-11-05 NOTE — ED Triage Notes (Signed)
B/anxiety x 1 day, pt stated she was recently cath'd and has 3 blockages ,

## 2020-11-05 NOTE — ED Provider Notes (Signed)
[facility] Emergency Department Provider Note  ____________________________________________  Time seen: Approximately 8:03 PM  I have reviewed the triage vital signs and the nursing notes.   HISTORY  Chief Complaint Shortness of Breath (X 1 day, anxiety /sob )    HPI [name] is a 75 y.o. female with a history of COPD, CAD, diabetes and anxiety who comes the ED complaining of shortness of breath since yesterday.  She was recently hospitalized  2 months ago for CHF exacerbation due in part due to medication noncompliance.  She underwent extensive work-up at that time including echocardiogram, right heart cath, left heart cath, found to have extensive severe disease, mostly patent CABG vessels, cardiology recommended aggressive medical therapy.  Patient states has been taking all of her medicines except for her Lasix.  She felt like her leg edema was not bad enough to need Lasix as she has not taken it in several days.  Denies fevers chills chest pain.  She has occasional nonproductive cough. She does not check her weight daily.  Shortness of breath is worse with exertion and lying down flat.  No alleviating factors.  Constant.  Moderate intensity.     Past Medical History:  Diagnosis Date  . 1st degree AV block   . ACE-inhibitor cough   . Allergic rhinitis   . Anemia    iron deficiency anemia  . Anxiety   . Aortic ectasia (HCC)    a. CT abd in 12/2016 incidentally noted aortic atherosclerosis and infrarenal abdominal aortic ectasia measuring as large as 2.7 cm with recommendation to repeat US in 2023.  Marland Kitchen Arthritis   . Asthma   . Cataract   . Chronic depression   . Chronic diastolic CHF (congestive heart failure) ([place])   . Chronic headache   . COPD (chronic obstructive pulmonary disease) ([place])   . Coronary artery disease    a. DES to RCA and mid Cx 2009. b. CABG and bioprosthetic AVR May 2015. c. cutting balloon to prox Cx in 05/2016  . Diabetes  mellitus    type 2  . Diverticulitis of colon   . Essential hypertension   . GERD (gastroesophageal reflux disease)   . Hearing loss   . History of blood transfusion 2013  . History of prosthetic aortic valve replacement   . HOH (hard of hearing)   . Hypercholesterolemia    intolerance of statins and niaspan  . IDA (iron deficiency anemia) 02/03/2019  . Mobitz type 1 second degree AV block   . OSA (obstructive sleep apnea)    mild, intolerant of cpap  . PAD (peripheral artery disease) ([place])    a. atherosclerosis by CT abd 12/2016 in [place].  Marland Kitchen PONV (postoperative nausea and vomiting)   . Statin intolerance   . Thyroid disease      Patient Active Problem List   Diagnosis Date Noted  . Angina pectoris ([place]) 11/03/2020  . CHF exacerbation ([place]) 09/07/2020  . Elevated troponin 09/07/2020  . Chest pain 08/09/2020  . Acute on chronic diastolic CHF (congestive heart failure) ([place]) 08/08/2020  . Anemia due to stage 3a chronic kidney disease ([place]) 05/21/2019  . IDA (iron deficiency anemia) 02/03/2019  . COPD exacerbation ([place]) 12/28/2017  . Depression, major, single episode, complete remission ([place]) 04/11/2017  . 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 Aortic ectasia, abdominal ([place]) 01/01/2017  . GAD (generalized anxiety disorder) 09/26/2016  . Acute posthemorrhagic anemia 08/24/2016  . Acute respiratory failure with hypoxia ([place]) 08/24/2016  .  Acute diastolic CHF (congestive heart failure) ([place]) 08/24/2016  . Hyponatremia 08/24/2016  . Leukocytosis 08/24/2016  . Fever 08/24/2016  . Diarrhea 08/24/2016  . Generalized weakness 08/24/2016  . Acute diverticulitis 08/16/2016  . Gastrointestinal hemorrhage   . Ataxia 08/09/2016  . Dizziness 08/09/2016  . Staggering gait 08/09/2016  . NSTEMI (non-ST elevated myocardial infarction) ([place])   . AVB (atrioventricular block)   . Atypical chest pain   . Unstable angina ([place]) 05/25/2016  . Palpitations 05/25/2016  . PUD - recent Rx  for H.Pylori 05/25/2016  . Stenosis of coronary artery stent   . Recurrent major depressive disorder, in partial remission ([place]) 07/29/2015  . Sialadenitis 04/25/2015  . Viral URI 03/24/2015  . Hypoglycemia 08/25/2014  . Nausea without vomiting 08/25/2014  . Dyslipidemia-statin ontol   . Long-term insulin use ([place]) 03/31/2014  . Aortic valve disorder 11/11/2013  . 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 Coronary Artery Disease s/p CABG 10/2013 10/10/2013  . Other malaise and fatigue 09/11/2013  . Alopecia 03/06/2013  . Insomnia 02/06/2013  . Gastroesophageal reflux disease without esophagitis 01/19/2013  . Dysphagia, unspecified(787.20) 01/19/2013  . Dyspnea 11/17/2012  . Weakness 11/17/2012  . Abnormal MRI of head 09/05/2012  . Depression with anxiety 03/21/2012  . Extrinsic asthma 11/12/2011  . Anemia 06/07/2011  . Chronic obstructive pulmonary disease ([place]) 01/25/2010  . Acquired hypothyroidism 08/10/2009  . MICROSCOPIC HEMATURIA 05/07/2009  . GOITER 03/02/2009  . Essential hypertension 01/06/2009  . MEMORY LOSS 06/23/2008  . Obstructive sleep apnea 12/25/2007  . DM type 2 (diabetes mellitus, type 2) ([place]) 09/09/2006  . ALLERGIC RHINITIS 09/09/2006  . DIVERTICULOSIS, COLON 09/09/2006     Past Surgical History:  Procedure Laterality Date  . ABDOMINAL HYSTERECTOMY    . ABDOMINAL HYSTERECTOMY W/ PARTIAL VAGINACTOMY    . AORTIC VALVE REPLACEMENT N/A 10/12/2013   Procedure: AORTIC VALVE REPLACEMENT (AVR);  Surgeon: [name], MD;  Location: [facility];  Service: Open Heart Surgery;  Laterality: N/A;  . [place]  . BARTHOLIN GLAND CYST EXCISION    . BLADDER SUSPENSION    . BREAST BIOPSY Bilateral 09/11/2000   neg  . BREAST BIOPSY Left 07/24/2010   neg  . BREAST CYST EXCISION  1988   bilateral nonmalignant tumors, x3  . CARDIAC CATHETERIZATION    . CARDIAC CATHETERIZATION N/A 05/25/2016   Procedure: Coronary Balloon Angioplasty;  Surgeon: Leonie Man, MD;   Location: [place] [facility];  Service: Cardiovascular;  Laterality: N/A;  . CARDIAC CATHETERIZATION N/A 05/25/2016   Procedure: Coronary/Graft Angiography;  Surgeon: Leonie Man, MD;  Location: [place] [facility];  Service: Cardiovascular;  Laterality: N/A;  . CATARACT EXTRACTION W/ INTRAOCULAR LENS  IMPLANT, BILATERAL    . CHOLECYSTECTOMY  2001  . COLECTOMY     lap sigmoid  . COLONOSCOPY  2014   polyps found, 2 clamped off.  . CORONARY ANGIOPLASTY  10/29/2007   Prox RCA & Mid Cx.  . CORONARY ARTERY BYPASS GRAFT N/A 10/12/2013   Procedure: CORONARY ARTERY BYPASS GRAFT TIMES TWO;  Surgeon: [name], MD;  Location: [place];  Service: Open Heart Surgery;  Laterality: N/A;  . CORONARY/GRAFT ANGIOGRAPHY N/A 09/20/2017   Procedure: CORONARY/GRAFT ANGIOGRAPHY;  Surgeon: Sherren Mocha, MD;  Location: [place] [facility];  Service: Cardiovascular;  Laterality: N/A;  . LEFT HEART CATHETERIZATION WITH CORONARY ANGIOGRAM N/A 10/09/2013   Procedure: LEFT HEART CATHETERIZATION WITH CORONARY ANGIOGRAM;  Surgeon: [name], MD;  Location: [facility] CATH LAB;  Service: Cardiovascular;  Laterality: N/A;  . STERNAL WIRES REMOVAL N/A 04/13/2014   Procedure: STERNAL WIRES REMOVAL;  Surgeon: [name], MD;  Location: [place];  Service: Thoracic;  Laterality: N/A;  . TEE WITHOUT CARDIOVERSION N/A 11/03/2020   Procedure: TRANSESOPHAGEAL ECHOCARDIOGRAM (TEE);  Surgeon: Corey Skains, MD;  Location: [facility];  Service: Cardiovascular;  Laterality: N/A;  . THYROIDECTOMY    . TONSILLECTOMY    . TUBAL LIGATION    . VAGINAL DELIVERY     3  . VISCERAL ARTERY INTERVENTION N/A 08/16/2016   Procedure: Visceral Artery Intervention;  Surgeon: Algernon Huxley, MD;  Location: [place] [facility];  Service: Cardiovascular;  Laterality: N/A;     Prior to Admission medications   Medication Sig Start Date End Date Taking? Authorizing Provider  acetaminophen (TYLENOL) 500 MG tablet Take 500-1,000 mg by mouth every  6 (six) hours as needed (pain).    [provider]  albuterol (PROVENTIL) (2.5 MG/3ML) 0.083% nebulizer solution Take 2.5 mg by nebulization every 4 (four) hours as needed for shortness of breath or wheezing. 06/07/20   [provider]  aspirin EC 81 MG EC tablet Take 1 tablet (81 mg total) by mouth daily. Swallow whole. 08/10/20   Samuella Cota, MD  Calcium-Vitamin D (CALTRATE 600 PLUS-VIT D PO) Take 1 tablet by mouth 2 (two) times daily.    [provider]  DULoxetine (CYMBALTA) 20 MG capsule Take 2 capsules (40 mg total) by mouth daily. Patient taking differently: Take 20 mg by mouth in the morning. 09/08/20   Fritzi Mandes, MD  FLUoxetine (PROZAC) 40 MG capsule Take 40 mg by mouth in the morning. 08/23/20   [provider]  furosemide (LASIX) 20 MG tablet Take 1 tablet (20 mg total) by mouth daily. Patient taking differently: Take 20 mg by mouth daily as needed (fluid retention). 09/08/20 09/08/21  Fritzi Mandes, MD  glipiZIDE (GLUCOTROL XL) 10 MG 24 hr tablet Take 10 mg by mouth in the morning. 07/19/20   [provider]  insulin NPH-regular Human (NOVOLIN 70/30) (70-30) 100 UNIT/ML injection Inject 100 Units into the skin 2 (two) times daily with a meal. Inject 100u under the skin every morning at breakfast and inject 100u under the skin every evening with supper    [provider]  isosorbide mononitrate (IMDUR) 30 MG 24 hr tablet Take 1 tablet (30 mg total) by mouth daily. 11/03/20 11/03/21  Corey Skains, MD  levothyroxine (SYNTHROID, LEVOTHROID) 125 MCG tablet Take 125 mcg by mouth daily before breakfast. 04/03/17   [provider]  losartan (COZAAR) 25 MG tablet Take 1 tablet (25 mg total) by mouth daily. Patient taking differently: Take 25 mg by mouth in the morning. 08/10/20   Samuella Cota, MD  nitroGLYCERIN (NITROSTAT) 0.4 MG SL tablet Place 1 tablet (0.4 mg total) under the tongue every 5 (five) minutes as needed for chest pain.  Please make overdue appt with Dr. Angelena Form before anymore refills. 1st attempt Patient not taking: Reported on 11/03/2020 09/08/20   Fritzi Mandes, MD  pantoprazole (PROTONIX) 40 MG tablet Take 40 mg by mouth in the morning.    [provider]  potassium chloride (KLOR-CON) 10 MEQ tablet Take 1 tablet (10 mEq total) by mouth daily. Patient taking differently: Take 10 mEq by mouth daily as needed (fluid retention). 09/08/20   Fritzi Mandes, MD  promethazine (PHENERGAN) 25 MG tablet Take 25 mg by mouth every 6 (six) hours as needed  for nausea or vomiting.    [provider]  traZODone (DESYREL) 100 MG tablet Take 200 mg by mouth at bedtime.    [provider]     Allergies Amitriptyline, Benadryl [diphenhydramine], Demerol [meperidine], Gabapentin, Loratadine, Meperidine hcl, Mirtazapine, Olanzapine, Voltaren [diclofenac sodium], Zetia [ezetimibe], Ativan [lorazepam], Atorvastatin, Budesonide-formoterol fumarate, Bupropion hcl, Caffeine, Codeine sulfate, Lisinopril, Metformin, Mometasone furoate, Morphine sulfate, Other, Oxycodone-acetaminophen, Pioglitazone, Propoxyphene n-acetaminophen, Rosuvastatin, Shellfish allergy, Suvorexant, Ticagrelor, Tramadol, Trazodone and nefazodone, Venlafaxine, Zolpidem tartrate, and Latex   Family History  Problem Relation Age of Onset  . Breast cancer Mother 30  . Hypertension Father   . Mesothelioma Father   . Asthma Father   . Stroke Paternal Grandfather   . Heart disease Other   . Breast cancer Maternal Aunt   . Breast cancer Paternal Aunt     Social History Social History   Tobacco Use  . Smoking status: Former Smoker    Packs/day: 0.50    Years: 30.00    Pack years: 15.00    Types: Cigarettes    Quit date: 10/02/2013    Years since quitting: 7.0  . Smokeless tobacco: Never Used  Vaping Use  . Vaping Use: Never used  Substance Use Topics  . Alcohol use: No  . Drug use: No    Review of Systems  Constitutional:   No fever  or chills.  ENT:   No sore throat. No rhinorrhea. Cardiovascular:   No chest pain or syncope. Respiratory:   Positive shortness of breath and nonproductive cough. Gastrointestinal:   Negative for abdominal pain, vomiting and diarrhea.  Musculoskeletal:   Negative for focal pain or swelling All other systems reviewed and are negative except as documented above in ROS and HPI.  ____________________________________________   PHYSICAL EXAM:  VITAL SIGNS: ED Triage Vitals  Enc Vitals Group     BP 11/05/20 1723 139/71     Pulse Rate 11/05/20 1723 85     Resp 11/05/20 1723 (!) 25     Temp 11/05/20 1723 98.5 F (36.9 C)     Temp Source 11/05/20 1723 Oral     SpO2 11/05/20 1723 98 %     Weight --      Height --      Head Circumference --      Peak Flow --      Pain Score 11/05/20 1724 0     Pain Loc --      Pain Edu? --      Excl. in [place]? --     Vital signs reviewed, nursing assessments reviewed.   Constitutional:   Alert and oriented. Non-toxic appearance. Eyes:   Conjunctivae are normal. EOMI. PERRL. ENT      Head:   Normocephalic and atraumatic.      Nose:   Normal.      Mouth/Throat: Moist mucosa.      Neck:   No meningismus. Full ROM. Hematological/Lymphatic/Immunilogical:   No cervical lymphadenopathy. Cardiovascular:   RRR. Symmetric bilateral radial and DP pulses.  No murmurs. Cap refill less than 2 seconds. Respiratory:   Normal respiratory effort without tachypnea/retractions. Breath sounds are clear and equal bilaterally. No wheezes/rales/rhonchi. Gastrointestinal:   Soft and nontender. Non distended. There is no CVA tenderness.  No rebound, rigidity, or guarding. Genitourinary:   deferred Musculoskeletal:   Normal range of motion in all extremities. No joint effusions.  No lower extremity tenderness.  1+ pitting edema bilateral lower extremities Neurologic:   Normal speech and language.  Motor grossly intact. No acute focal neurologic deficits are appreciated.   Skin:    Skin is warm, dry and intact. No rash noted.  No petechiae, purpura, or bullae.  ____________________________________________    LABS (pertinent positives/negatives) (all labs ordered are listed, but only abnormal results are displayed) Labs Reviewed  BASIC METABOLIC PANEL - Abnormal; Notable for the following components:      Result Value   Glucose, Bld 230 (*)    Creatinine, Ser 1.09 (*)    Calcium 8.5 (*)    GFR, Estimated 53 (*)    All other components within normal limits  CBC WITH DIFFERENTIAL/PLATELET - Abnormal; Notable for the following components:   RBC 3.33 (*)    Hemoglobin 9.5 (*)    HCT 29.3 (*)    Abs Immature Granulocytes 0.08 (*)    All other components within normal limits  BRAIN NATRIURETIC PEPTIDE - Abnormal; Notable for the following components:   B Natriuretic Peptide 231.2 (*)    All other components within normal limits  TROPONIN I (HIGH SENSITIVITY) - Abnormal; Notable for the following components:   Troponin I (High Sensitivity) 32 (*)    All other components within normal limits  TROPONIN I (HIGH SENSITIVITY) - Abnormal; Notable for the following components:   Troponin I (High Sensitivity) 37 (*)    All other components within normal limits   ____________________________________________   EKG  Interpreted by me Sinus rhythm rate of 83, normal axis and intervals.  Normal QRS ST segments and T waves.  There is evidence of LVH with repolarization abnormality in the high lateral leads.  ____________________________________________    RADIOLOGY  DG Chest Portable 1 View  Result Date: 11/05/2020 CLINICAL DATA:  Anxiety for 1 day, coronary artery disease, asthma, CHF, COPD, diabetes mellitus, hypertension EXAM: PORTABLE CHEST 1 VIEW COMPARISON:  Portable exam 1759 hours compared to 09/07/2020 FINDINGS: Enlargement of cardiac silhouette post CABG and AVR. Mediastinal contours. Chronic accentuation of interstitial markings, likely reflecting  mild chronic pulmonary edema, unchanged since prior study. No pleural effusion, pneumothorax or segmental consolidation. Osseous structures unremarkable. IMPRESSION: Enlargement of cardiac silhouette with pulmonary vascular congestion and mild chronic interstitial edema. Electronically Signed   By: Lavonia Dana M.D.   On: 11/05/2020 18:11    ____________________________________________   PROCEDURES Procedures  ____________________________________________  DIFFERENTIAL DIAGNOSIS   Mild CHF exacerbation, pneumonia, pleural effusion, non-STEMI, electrolyte abnormality  CLINICAL IMPRESSION / ASSESSMENT AND PLAN / ED COURSE  Medications ordered in the ED: Medications  LORazepam (ATIVAN) tablet 0.5 mg (0.5 mg Oral Given 11/05/20 1755)  furosemide (LASIX) injection 40 mg (40 mg Intravenous Given 11/05/20 1913)    Pertinent labs & imaging results that were available during my care of the patient were reviewed by me and considered in my medical decision making (see chart for details).  LOUCILLE TAKACH was evaluated in Emergency Department on 11/05/2020 for the symptoms described in the history of present illness. She was evaluated in the context of the global COVID-19 pandemic, which necessitated consideration that the patient might be at risk for infection with the SARS-CoV-2 virus that causes COVID-19. Institutional protocols and algorithms that pertain to the evaluation of patients at risk for COVID-19 are in a state of rapid change based on information released by regulatory bodies including the CDC and federal and state organizations. These policies and algorithms were followed during the patient's care in the ED.   Patient presents with shortness of breath, dyspnea on exertion, symptoms consistent with CHF.  Vital signs are normal, no respiratory distress, clear lungs.  Doubt ACS PE dissection AAA pericarditis.  Reviewing patient's weight, seems that she is a few pounds above baseline currently.   Patient given a dose of IV Lasix in the ED, voiding freely.  She is ambulatory and feeling better, and request to be discharged home.  We will trend troponin if patient allows.  She has medical decision-making capacity.    Counseled pt on diuretic compliance and tracking daily weight to help manage her heart failure. In the absence of both, her fluid balance is expected to be labile.   ----------------------------------------- 8:34 PM on 11/05/2020 -----------------------------------------  Repeat troponin not significantly changed.  Patient feeling better, eager to be discharged.  She is ambulatory, steady gait, comfortable breathing and suitable for outpatient follow-up.     ____________________________________________   FINAL CLINICAL IMPRESSION(S) / ED DIAGNOSES    Final diagnoses:  Acute on chronic congestive heart failure, unspecified heart failure type ([place])  Type 2 diabetes mellitus without complication, with long-term current use of insulin [facility])     ED Discharge Orders    None      Portions of this note were generated with dragon dictation software. Dictation errors may occur despite best attempts at proofreading.   Carrie Mew, MD 11/05/20 2035

## 2020-11-07 ENCOUNTER — Encounter: Payer: Self-pay | Admitting: Internal Medicine

## 2020-11-07 ENCOUNTER — Telehealth: Payer: Self-pay | Admitting: Family

## 2020-11-07 NOTE — Telephone Encounter (Signed)
Spoke to patient and patients husband in attempt to schedule an appointment after receiving a referral from her recent ED visit. They stated they are waiting to follow up with Dr. Nehemiah Massed on Wednesday and will go from there and didn't want to schedule an appointment at this time.  [name], NT

## 2020-11-24 ENCOUNTER — Encounter: Payer: Self-pay | Admitting: Emergency Medicine

## 2020-11-24 ENCOUNTER — Inpatient Hospital Stay
Admission: EM | Admit: 2020-11-24 | Discharge: 2020-11-26 | DRG: 291 | Disposition: A | Discharge status: Home / Self Care | Payer: Medicare HMO | Attending: Internal Medicine | Admitting: Internal Medicine

## 2020-11-24 ENCOUNTER — Emergency Department: Payer: Medicare HMO

## 2020-11-24 ENCOUNTER — Other Ambulatory Visit: Payer: Self-pay

## 2020-11-24 DIAGNOSIS — E1165 Type 2 diabetes mellitus with hyperglycemia: Secondary | ICD-10-CM | POA: Diagnosis present

## 2020-11-24 DIAGNOSIS — Z87891 Personal history of nicotine dependence: Secondary | ICD-10-CM | POA: Diagnosis not present

## 2020-11-24 DIAGNOSIS — I110 Hypertensive heart disease with heart failure: Secondary | ICD-10-CM | POA: Diagnosis not present

## 2020-11-24 DIAGNOSIS — J449 Chronic obstructive pulmonary disease, unspecified: Secondary | ICD-10-CM | POA: Diagnosis present

## 2020-11-24 DIAGNOSIS — I2510 Atherosclerotic heart disease of native coronary artery without angina pectoris: Secondary | ICD-10-CM | POA: Diagnosis present

## 2020-11-24 DIAGNOSIS — R0602 Shortness of breath: Secondary | ICD-10-CM | POA: Diagnosis present

## 2020-11-24 DIAGNOSIS — G4733 Obstructive sleep apnea (adult) (pediatric): Secondary | ICD-10-CM | POA: Diagnosis present

## 2020-11-24 DIAGNOSIS — I441 Atrioventricular block, second degree: Secondary | ICD-10-CM | POA: Diagnosis present

## 2020-11-24 DIAGNOSIS — I5033 Acute on chronic diastolic (congestive) heart failure: Secondary | ICD-10-CM | POA: Diagnosis present

## 2020-11-24 DIAGNOSIS — E119 Type 2 diabetes mellitus without complications: Secondary | ICD-10-CM | POA: Diagnosis not present

## 2020-11-24 DIAGNOSIS — Z888 Allergy status to other drugs, medicaments and biological substances status: Secondary | ICD-10-CM

## 2020-11-24 DIAGNOSIS — J309 Allergic rhinitis, unspecified: Secondary | ICD-10-CM | POA: Diagnosis present

## 2020-11-24 DIAGNOSIS — Z9841 Cataract extraction status, right eye: Secondary | ICD-10-CM

## 2020-11-24 DIAGNOSIS — Z955 Presence of coronary angioplasty implant and graft: Secondary | ICD-10-CM

## 2020-11-24 DIAGNOSIS — E1151 Type 2 diabetes mellitus with diabetic peripheral angiopathy without gangrene: Secondary | ICD-10-CM | POA: Diagnosis present

## 2020-11-24 DIAGNOSIS — I10 Essential (primary) hypertension: Secondary | ICD-10-CM | POA: Diagnosis present

## 2020-11-24 DIAGNOSIS — R9431 Abnormal electrocardiogram [ECG] [EKG]: Secondary | ICD-10-CM | POA: Diagnosis not present

## 2020-11-24 DIAGNOSIS — Z794 Long term (current) use of insulin: Secondary | ICD-10-CM

## 2020-11-24 DIAGNOSIS — F411 Generalized anxiety disorder: Secondary | ICD-10-CM | POA: Diagnosis present

## 2020-11-24 DIAGNOSIS — E871 Hypo-osmolality and hyponatremia: Secondary | ICD-10-CM | POA: Diagnosis present

## 2020-11-24 DIAGNOSIS — F32A Depression, unspecified: Secondary | ICD-10-CM | POA: Diagnosis present

## 2020-11-24 DIAGNOSIS — Z79899 Other long term (current) drug therapy: Secondary | ICD-10-CM

## 2020-11-24 DIAGNOSIS — E785 Hyperlipidemia, unspecified: Secondary | ICD-10-CM | POA: Diagnosis present

## 2020-11-24 DIAGNOSIS — Z823 Family history of stroke: Secondary | ICD-10-CM

## 2020-11-24 DIAGNOSIS — K219 Gastro-esophageal reflux disease without esophagitis: Secondary | ICD-10-CM | POA: Diagnosis present

## 2020-11-24 DIAGNOSIS — Z8249 Family history of ischemic heart disease and other diseases of the circulatory system: Secondary | ICD-10-CM

## 2020-11-24 DIAGNOSIS — Z953 Presence of xenogenic heart valve: Secondary | ICD-10-CM | POA: Diagnosis not present

## 2020-11-24 DIAGNOSIS — E669 Obesity, unspecified: Secondary | ICD-10-CM | POA: Diagnosis present

## 2020-11-24 DIAGNOSIS — Z7982 Long term (current) use of aspirin: Secondary | ICD-10-CM

## 2020-11-24 DIAGNOSIS — I272 Pulmonary hypertension, unspecified: Secondary | ICD-10-CM | POA: Diagnosis present

## 2020-11-24 DIAGNOSIS — D508 Other iron deficiency anemias: Secondary | ICD-10-CM | POA: Diagnosis not present

## 2020-11-24 DIAGNOSIS — Z825 Family history of asthma and other chronic lower respiratory diseases: Secondary | ICD-10-CM

## 2020-11-24 DIAGNOSIS — I700 Atherosclerosis of aorta: Secondary | ICD-10-CM | POA: Diagnosis present

## 2020-11-24 DIAGNOSIS — Z7902 Long term (current) use of antithrombotics/antiplatelets: Secondary | ICD-10-CM

## 2020-11-24 DIAGNOSIS — E7800 Pure hypercholesterolemia, unspecified: Secondary | ICD-10-CM | POA: Diagnosis present

## 2020-11-24 DIAGNOSIS — Z91040 Latex allergy status: Secondary | ICD-10-CM

## 2020-11-24 DIAGNOSIS — Z885 Allergy status to narcotic agent status: Secondary | ICD-10-CM

## 2020-11-24 DIAGNOSIS — I4581 Long QT syndrome: Secondary | ICD-10-CM | POA: Diagnosis not present

## 2020-11-24 DIAGNOSIS — Z91013 Allergy to seafood: Secondary | ICD-10-CM

## 2020-11-24 DIAGNOSIS — Z961 Presence of intraocular lens: Secondary | ICD-10-CM | POA: Diagnosis present

## 2020-11-24 DIAGNOSIS — Z20822 Contact with and (suspected) exposure to covid-19: Secondary | ICD-10-CM | POA: Diagnosis present

## 2020-11-24 DIAGNOSIS — J811 Chronic pulmonary edema: Secondary | ICD-10-CM

## 2020-11-24 DIAGNOSIS — Z6832 Body mass index (BMI) 32.0-32.9, adult: Secondary | ICD-10-CM

## 2020-11-24 DIAGNOSIS — E039 Hypothyroidism, unspecified: Secondary | ICD-10-CM | POA: Diagnosis present

## 2020-11-24 DIAGNOSIS — D649 Anemia, unspecified: Secondary | ICD-10-CM | POA: Diagnosis present

## 2020-11-24 DIAGNOSIS — Z79890 Hormone replacement therapy (postmenopausal): Secondary | ICD-10-CM

## 2020-11-24 DIAGNOSIS — R079 Chest pain, unspecified: Secondary | ICD-10-CM

## 2020-11-24 DIAGNOSIS — Z9842 Cataract extraction status, left eye: Secondary | ICD-10-CM

## 2020-11-24 LAB — TROPONIN I (HIGH SENSITIVITY)
Troponin I (High Sensitivity): 34 ng/L — ABNORMAL HIGH (ref <18)
Troponin I (High Sensitivity): 32 ng/L — ABNORMAL HIGH (ref <18)

## 2020-11-24 LAB — BASIC METABOLIC PANEL
Anion gap: 6 (ref 5–15)
BUN: 20 mg/dL (ref 8–23)
CO2: 29 mmol/L (ref 22–32)
Calcium: 8.7 mg/dL — ABNORMAL LOW (ref 8.9–10.3)
Chloride: 98 mmol/L (ref 98–111)
Creatinine, Ser: 1.07 mg/dL — ABNORMAL HIGH (ref 0.44–1.00)
GFR, Estimated: 54 mL/min — ABNORMAL LOW (ref >60)
Glucose, Bld: 163 mg/dL — ABNORMAL HIGH (ref 70–99)
Potassium: 5.1 mmol/L (ref 3.5–5.1)
Sodium: 133 mmol/L — ABNORMAL LOW (ref 135–145)

## 2020-11-24 LAB — CBC
HCT: 29.7 % — ABNORMAL LOW (ref 36.0–46.0)
Hemoglobin: 9.6 g/dL — ABNORMAL LOW (ref 12.0–15.0)
MCH: 28 pg (ref 26.0–34.0)
MCHC: 32.3 g/dL (ref 30.0–36.0)
MCV: 86.6 fL (ref 80.0–100.0)
Platelets: 189 10*3/uL (ref 150–400)
RBC: 3.43 MIL/uL — ABNORMAL LOW (ref 3.87–5.11)
RDW: 13.8 % (ref 11.5–15.5)
WBC: 8.8 10*3/uL (ref 4.0–10.5)
nRBC: 0 % (ref 0.0–0.2)

## 2020-11-24 LAB — OSMOLALITY: Osmolality: 295 mOsm/kg (ref 275–295)

## 2020-11-24 LAB — BRAIN NATRIURETIC PEPTIDE: B Natriuretic Peptide: 169.3 pg/mL — ABNORMAL HIGH (ref 0.0–100.0)

## 2020-11-24 LAB — MAGNESIUM: Magnesium: 1.7 mg/dL (ref 1.7–2.4)

## 2020-11-24 LAB — TSH: TSH: 5.687 u[IU]/mL — ABNORMAL HIGH (ref 0.350–4.500)

## 2020-11-24 LAB — CBG MONITORING, ED
Glucose-Capillary: 170 mg/dL — ABNORMAL HIGH (ref 70–99)
Glucose-Capillary: 301 mg/dL — ABNORMAL HIGH (ref 70–99)

## 2020-11-24 LAB — GLUCOSE, CAPILLARY: Glucose-Capillary: 290 mg/dL — ABNORMAL HIGH (ref 70–99)

## 2020-11-24 LAB — PROCALCITONIN: Procalcitonin: 0.1 ng/mL

## 2020-11-24 MED ORDER — INSULIN ASPART 100 UNIT/ML IJ SOLN
0.0000 [IU] | Freq: Three times a day (TID) | INTRAMUSCULAR | Status: DC
  Administered 2020-11-25 (×2): 11 [IU] via SUBCUTANEOUS
  Administered 2020-11-25: 5 [IU] via SUBCUTANEOUS
  Administered 2020-11-26: 8 [IU] via SUBCUTANEOUS
  Filled 2020-11-24 (×4): qty 1

## 2020-11-24 MED ORDER — CLOPIDOGREL BISULFATE 75 MG PO TABS
75.0000 mg | ORAL_TABLET | Freq: Every day | ORAL | Status: DC
  Administered 2020-11-25 – 2020-11-26 (×2): 75 mg via ORAL
  Filled 2020-11-24 (×2): qty 1

## 2020-11-24 MED ORDER — PANTOPRAZOLE SODIUM 40 MG PO TBEC
40.0000 mg | DELAYED_RELEASE_TABLET | Freq: Every morning | ORAL | Status: DC
  Administered 2020-11-25: 40 mg via ORAL
  Filled 2020-11-24: qty 1

## 2020-11-24 MED ORDER — LEVOTHYROXINE SODIUM 25 MCG PO TABS
125.0000 ug | ORAL_TABLET | Freq: Every day | ORAL | Status: DC
  Administered 2020-11-25 – 2020-11-26 (×2): 125 ug via ORAL
  Filled 2020-11-24 (×2): qty 1

## 2020-11-24 MED ORDER — INSULIN DETEMIR 100 UNIT/ML ~~LOC~~ SOLN
20.0000 [IU] | Freq: Two times a day (BID) | SUBCUTANEOUS | Status: DC
  Administered 2020-11-24: 20 [IU] via SUBCUTANEOUS
  Filled 2020-11-24 (×3): qty 0.2

## 2020-11-24 MED ORDER — FLUOXETINE HCL 20 MG PO CAPS: 40.0000 mg | ORAL_CAPSULE | Freq: Two times a day (BID) | ORAL | Status: DC

## 2020-11-24 MED ORDER — ASPIRIN EC 81 MG PO TBEC
81.0000 mg | DELAYED_RELEASE_TABLET | Freq: Every day | ORAL | Status: DC
  Administered 2020-11-25 – 2020-11-26 (×2): 81 mg via ORAL
  Filled 2020-11-24 (×2): qty 1

## 2020-11-24 MED ORDER — FUROSEMIDE 10 MG/ML IJ SOLN: 40.0000 mg | Freq: Once | INTRAMUSCULAR | Status: DC

## 2020-11-24 MED ORDER — UMECLIDINIUM-VILANTEROL 62.5-25 MCG/INH IN AEPB
1.0000 | INHALATION_SPRAY | Freq: Every day | RESPIRATORY_TRACT | Status: DC
  Filled 2020-11-24: qty 14

## 2020-11-24 MED ORDER — INSULIN ASPART 100 UNIT/ML IJ SOLN
0.0000 [IU] | Freq: Every day | INTRAMUSCULAR | Status: DC
  Administered 2020-11-24 – 2020-11-25 (×2): 4 [IU] via SUBCUTANEOUS
  Filled 2020-11-24 (×2): qty 1

## 2020-11-24 MED ORDER — MAGNESIUM SULFATE 2 GM/50ML IV SOLN
2.0000 g | Freq: Once | INTRAVENOUS | Status: AC
  Administered 2020-11-24: 2 g via INTRAVENOUS
  Filled 2020-11-24: qty 50

## 2020-11-24 MED ORDER — ALBUTEROL SULFATE (2.5 MG/3ML) 0.083% IN NEBU
2.5000 mg | INHALATION_SOLUTION | RESPIRATORY_TRACT | Status: DC | PRN
  Administered 2020-11-25: 2.5 mg via RESPIRATORY_TRACT
  Filled 2020-11-24 (×2): qty 3

## 2020-11-24 MED ORDER — TRAZODONE HCL 100 MG PO TABS
200.0000 mg | ORAL_TABLET | Freq: Every evening | ORAL | Status: DC | PRN
  Administered 2020-11-25 (×2): 200 mg via ORAL
  Filled 2020-11-24 (×2): qty 2

## 2020-11-24 MED ORDER — LOSARTAN POTASSIUM 25 MG PO TABS
25.0000 mg | ORAL_TABLET | Freq: Every morning | ORAL | Status: DC
  Administered 2020-11-25: 25 mg via ORAL
  Filled 2020-11-24: qty 1

## 2020-11-24 MED ORDER — FUROSEMIDE 10 MG/ML IJ SOLN
20.0000 mg | Freq: Two times a day (BID) | INTRAMUSCULAR | Status: DC
  Administered 2020-11-24 – 2020-11-26 (×3): 20 mg via INTRAVENOUS
  Filled 2020-11-24 (×3): qty 2

## 2020-11-24 NOTE — ED Provider Notes (Signed)
[facility] Emergency Department Provider Note   ____________________________________________    I have reviewed the triage vital signs and the nursing notes.   HISTORY  Chief Complaint Chest Pain and Shortness of Breath     HPI [name] is a 75 y.o. female with a history of aortic ectasia, CHF, COPD, CAD with PCI 10 days ago with stent placement, diabetes who presents with complaints of chest pain as well as shortness of breath.  Patient reports her breathing has worsened over the last several days.  She describes a tightness in her chest bilaterally which seems to be improved now but her breathing is still labored.  Denies fevers or chills.  Occasional cough.  Reports compliance with her medications.  Past Medical History:  Diagnosis Date   1st degree AV block    ACE-inhibitor cough    Allergic rhinitis    Anemia    iron deficiency anemia   Anxiety    Aortic ectasia (HCC)    a. CT abd in 12/2016 incidentally noted aortic atherosclerosis and infrarenal abdominal aortic ectasia measuring as large as 2.7 cm with recommendation to repeat US in 2023.   Arthritis    Asthma    Cataract    Chronic depression    Chronic diastolic CHF (congestive heart failure) (HCC)    Chronic headache    COPD (chronic obstructive pulmonary disease) (HCC)    Coronary artery disease    a. DES to RCA and mid Cx 2009. b. CABG and bioprosthetic AVR May 2015. c. cutting balloon to prox Cx in 05/2016   Diabetes mellitus    type 2   Diverticulitis of colon    Essential hypertension    GERD (gastroesophageal reflux disease)    Hearing loss    History of blood transfusion 2013   History of prosthetic aortic valve replacement    HOH (hard of hearing)    Hypercholesterolemia    intolerance of statins and niaspan   IDA (iron deficiency anemia) 02/03/2019   Mobitz type 1 second degree AV block    OSA (obstructive sleep apnea)    mild, intolerant of cpap   PAD  (peripheral artery disease) ([place])    a. atherosclerosis by CT abd 12/2016 in [place].   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   Aortic ectasia, abdominal ([place]) 01/01/2017   GAD (generalized anxiety disorder) 09/26/2016   Acute posthemorrhagic anemia 08/24/2016   Acute respiratory failure with hypoxia ([place]) 35/36/1443   Acute diastolic CHF (congestive heart failure) ([place]) 08/24/2016   Hyponatremia 08/24/2016   Leukocytosis 08/24/2016   Fever 08/24/2016   Diarrhea 08/24/2016   Generalized weakness 08/24/2016   Acute diverticulitis 08/16/2016   Gastrointestinal hemorrhage    Ataxia 08/09/2016   Dizziness 08/09/2016   Staggering gait 08/09/2016   NSTEMI (non-ST elevated myocardial infarction) (HCC)    AVB (atrioventricular block)    Atypical chest pain  Unstable angina ([place]) 05/25/2016   Palpitations 05/25/2016   PUD - recent Rx for H.Pylori 05/25/2016   Stenosis of coronary artery stent    Recurrent major depressive disorder, in partial remission ([place]) 07/29/2015   Sialadenitis 04/25/2015   Viral URI 03/24/2015   Hypoglycemia 08/25/2014   Nausea without vomiting 08/25/2014   Dyslipidemia-statin ontol    Long-term insulin use ([place]) 03/31/2014   Aortic valve disorder 11/11/2013   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 10/12/2013   Coronary Artery Disease s/p CABG 10/2013 10/10/2013   Other malaise and fatigue 09/11/2013   Alopecia  03/06/2013   Insomnia 02/06/2013   Gastroesophageal reflux disease without esophagitis 01/19/2013   Dysphagia, unspecified(787.20) 01/19/2013   Dyspnea 11/17/2012   Weakness 11/17/2012   Abnormal MRI of head 09/05/2012   Depression with anxiety 03/21/2012   Extrinsic asthma 11/12/2011   Anemia 06/07/2011   Chronic obstructive pulmonary disease ([place]) 01/25/2010   Acquired hypothyroidism 08/10/2009   MICROSCOPIC HEMATURIA 05/07/2009   GOITER 03/02/2009   Essential hypertension 01/06/2009   MEMORY LOSS 06/23/2008   Obstructive sleep apnea 12/25/2007   DM type 2 (diabetes mellitus, type 2) ([place]) 09/09/2006   ALLERGIC RHINITIS 09/09/2006   DIVERTICULOSIS, COLON 09/09/2006    Past Surgical History:  Procedure Laterality Date   ABDOMINAL HYSTERECTOMY     ABDOMINAL HYSTERECTOMY W/ PARTIAL VAGINACTOMY     AORTIC VALVE REPLACEMENT N/A 10/12/2013   Procedure: AORTIC VALVE REPLACEMENT (AVR);  Surgeon: [name], MD;  Location: [place];  Service: Open Heart Surgery;  Laterality: N/A;   APPENDECTOMY  1964   BARTHOLIN GLAND CYST EXCISION     BLADDER SUSPENSION     BREAST BIOPSY Bilateral 09/11/2000   neg   BREAST BIOPSY Left 07/24/2010   neg   BREAST CYST EXCISION  1988   bilateral nonmalignant tumors, x3   CARDIAC CATHETERIZATION     CARDIAC CATHETERIZATION N/A 05/25/2016   Procedure: Coronary Balloon Angioplasty;  Surgeon: Leonie Man, MD;  Location: [place] [facility];  Service: Cardiovascular;  Laterality: N/A;   CARDIAC CATHETERIZATION N/A 05/25/2016   Procedure: Coronary/Graft Angiography;  Surgeon: Leonie Man, MD;  Location: [place] [facility];  Service: Cardiovascular;  Laterality: N/A;   CATARACT EXTRACTION W/ INTRAOCULAR LENS  IMPLANT, BILATERAL     CHOLECYSTECTOMY  2001   COLECTOMY     lap sigmoid   COLONOSCOPY  2014   polyps found, 2 clamped off.   CORONARY ANGIOPLASTY  10/29/2007   Prox RCA & Mid Cx.   CORONARY ARTERY BYPASS GRAFT N/A 10/12/2013   Procedure:  CORONARY ARTERY BYPASS GRAFT TIMES TWO;  Surgeon: [name], MD;  Location: [place] [facility];  Service: Open Heart Surgery;  Laterality: N/A;   CORONARY/GRAFT ANGIOGRAPHY N/A 09/20/2017   Procedure: CORONARY/GRAFT ANGIOGRAPHY;  Surgeon: Sherren Mocha, MD;  Location: [place] [facility];  Service: Cardiovascular;  Laterality: N/A;   LEFT HEART CATHETERIZATION WITH CORONARY ANGIOGRAM N/A 10/09/2013   Procedure: LEFT HEART CATHETERIZATION WITH CORONARY ANGIOGRAM;  Surgeon: [name], MD;  Location: [facility] C[facility] LAB;  Service: Cardiovascular;  Laterality: N/A;   RIGHT/LEFT HEART CATH AND CORONARY ANGIOGRAPHY N/A 11/03/2020   Procedure: RIGHT/LEFT HEART CATH AND CORONARY ANGIOGRAPHY;  Surgeon: Corey Skains, MD;  Location: [place] [facility];  Service: Cardiovascular;  Laterality: N/A;   STERNAL WIRES REMOVAL N/A 04/13/2014   Procedure: STERNAL WIRES REMOVAL;  Surgeon: [name], MD;  Location: [facility];  Service: Thoracic;  Laterality: N/A;   TEE WITHOUT  CARDIOVERSION N/A 11/03/2020   Procedure: TRANSESOPHAGEAL ECHOCARDIOGRAM (TEE);  Surgeon: Corey Skains, MD;  Location: [facility];  Service: Cardiovascular;  Laterality: N/A;   THYROIDECTOMY     TONSILLECTOMY     TUBAL LIGATION     VAGINAL DELIVERY     3   VISCERAL ARTERY INTERVENTION N/A 08/16/2016   Procedure: Visceral Artery Intervention;  Surgeon: Algernon Huxley, MD;  Location: [place] [facility];  Service: Cardiovascular;  Laterality: N/A;    Prior to Admission medications   Medication Sig Start Date End Date Taking? Authorizing Provider  acetaminophen (TYLENOL) 500 MG tablet Take 500-1,000 mg by mouth every 6 (six) hours as needed (pain).    [provider]  albuterol (PROVENTIL) (2.5 MG/3ML) 0.083% nebulizer solution Take 2.5 mg by nebulization every 4 (four) hours as needed for shortness of breath or wheezing. 06/07/20   [provider]  aspirin EC 81 MG EC tablet Take 1 tablet (81 mg total) by mouth daily. Swallow  whole. 08/10/20   Samuella Cota, MD  Calcium-Vitamin D (CALTRATE 600 PLUS-VIT D PO) Take 1 tablet by mouth 2 (two) times daily.    [provider]  DULoxetine (CYMBALTA) 20 MG capsule Take 2 capsules (40 mg total) by mouth daily. Patient taking differently: Take 20 mg by mouth in the morning. 09/08/20   Fritzi Mandes, MD  FLUoxetine (PROZAC) 40 MG capsule Take 40 mg by mouth in the morning. 08/23/20   [provider]  furosemide (LASIX) 20 MG tablet Take 1 tablet (20 mg total) by mouth daily. Patient taking differently: Take 20 mg by mouth daily as needed (fluid retention). 09/08/20 09/08/21  Fritzi Mandes, MD  glipiZIDE (GLUCOTROL XL) 10 MG 24 hr tablet Take 10 mg by mouth in the morning. 07/19/20   [provider]  insulin NPH-regular Human (NOVOLIN 70/30) (70-30) 100 UNIT/ML injection Inject 100 Units into the skin 2 (two) times daily with a meal. Inject 100u under the skin every morning at breakfast and inject 100u under the skin every evening with supper    [provider]  isosorbide mononitrate (IMDUR) 30 MG 24 hr tablet Take 1 tablet (30 mg total) by mouth daily. 11/03/20 11/03/21  Corey Skains, MD  levothyroxine (SYNTHROID, LEVOTHROID) 125 MCG tablet Take 125 mcg by mouth daily before breakfast. 04/03/17   [provider]  losartan (COZAAR) 25 MG tablet Take 1 tablet (25 mg total) by mouth daily. Patient taking differently: Take 25 mg by mouth in the morning. 08/10/20   Samuella Cota, MD  nitroGLYCERIN (NITROSTAT) 0.4 MG SL tablet Place 1 tablet (0.4 mg total) under the tongue every 5 (five) minutes as needed for chest pain. Please make overdue appt with Dr. Angelena Form before anymore refills. 1st attempt Patient not taking: Reported on 11/03/2020 09/08/20   Fritzi Mandes, MD  pantoprazole (PROTONIX) 40 MG tablet Take 40 mg by mouth in the morning.    [provider]  potassium chloride (KLOR-CON) 10 MEQ tablet Take 1 tablet (10 mEq total) by mouth  daily. Patient taking differently: Take 10 mEq by mouth daily as needed (fluid retention). 09/08/20   Fritzi Mandes, MD  promethazine (PHENERGAN) 25 MG tablet Take 25 mg by mouth every 6 (six) hours as needed for nausea or vomiting.    [provider]  traZODone (DESYREL) 100 MG tablet Take 200 mg by mouth at bedtime.    [provider]     Allergies Amitriptyline, Benadryl [diphenhydramine], Demerol [meperidine],  Gabapentin, Loratadine, Meperidine hcl, Mirtazapine, Olanzapine, Voltaren [diclofenac sodium], Zetia [ezetimibe], Ativan [lorazepam], Atorvastatin, Budesonide-formoterol fumarate, Bupropion hcl, Caffeine, Codeine sulfate, Lisinopril, Metformin, Mometasone furoate, Morphine sulfate, Other, Oxycodone-acetaminophen, Pioglitazone, Propoxyphene n-acetaminophen, Rosuvastatin, Shellfish allergy, Suvorexant, Ticagrelor, Tramadol, Trazodone and nefazodone, Venlafaxine, Zolpidem tartrate, and Latex  Family History  Problem Relation Age of Onset   Breast cancer Mother 78   Hypertension Father    Mesothelioma Father    Asthma Father    Stroke Paternal Grandfather    Heart disease Other    Breast cancer Maternal Aunt    Breast cancer Paternal Aunt     Social History Social History   Tobacco Use   Smoking status: Former    Packs/day: 0.50    Years: 30.00    Pack years: 15.00    Types: Cigarettes    Quit date: 10/02/2013    Years since quitting: 7.1   Smokeless tobacco: Never  Vaping Use   Vaping Use: Never used  Substance Use Topics   Alcohol use: No   Drug use: No    Review of Systems  Constitutional: No fever/chills Eyes: No visual changes.  ENT: No sore throat. Cardiovascular: As above Respiratory: As Gastrointestinal: No abdominal pain.  No nausea, no vomiting.   Genitourinary: Negative for dysuria. Musculoskeletal: Negative for back pain. Skin: Negative for rash. Neurological: Negative for headaches     ____________________________________________   PHYSICAL EXAM:  VITAL SIGNS: ED Triage Vitals  Enc Vitals Group     BP 11/24/20 1644 (!) 134/57     Pulse Rate 11/24/20 1644 76     Resp 11/24/20 1644 (!) 24     Temp 11/24/20 1644 98.5 F (36.9 C)     Temp Source 11/24/20 1644 Oral     SpO2 11/24/20 1644 100 %     Weight 11/24/20 1641 90.7 kg (199 lb 15.3 oz)     Height 11/24/20 1641 1.676 m (5\' 6" )     Head Circumference --      Peak Flow --      Pain Score 11/24/20 1641 8     Pain Loc --      Pain Edu? --      Excl. in [place]? --     Constitutional: Alert and oriented.  Eyes: Conjunctivae are normal.   Nose: No congestion/rhinnorhea. Mouth/Throat: Mucous membranes are moist.    Cardiovascular: Normal rate, regular rhythm. Grossly normal heart sounds.  Good peripheral circulation. Respiratory: Normal respiratory effort.  No retractions. Lungs CTAB. Gastrointestinal: Soft and nontender. No distention.    Musculoskeletal: No lower extremity tenderness nor edema.  Warm and well perfused Neurologic:  Normal speech and language. No gross focal neurologic deficits are appreciated.  Skin:  Skin is warm, dry and intact. No rash noted. Psychiatric: Mood and affect are normal. Speech and behavior are normal.  ____________________________________________   LABS (all labs ordered are listed, but only abnormal results are displayed)  Labs Reviewed  BASIC METABOLIC PANEL - Abnormal; Notable for the following components:      Result Value   Sodium 133 (*)    Glucose, Bld 163 (*)    Creatinine, Ser 1.07 (*)    Calcium 8.7 (*)    GFR, Estimated 54 (*)    All other components within normal limits  CBC - Abnormal; Notable for the following components:   RBC 3.43 (*)    Hemoglobin 9.6 (*)    HCT 29.7 (*)    All other components within normal limits  BRAIN  NATRIURETIC PEPTIDE - Abnormal; Notable for the following components:   B Natriuretic Peptide 169.3 (*)    All other  components within normal limits  TROPONIN I (HIGH SENSITIVITY) - Abnormal; Notable for the following components:   Troponin I (High Sensitivity) 34 (*)    All other components within normal limits  TROPONIN I (HIGH SENSITIVITY)   ____________________________________________  EKG  ED ECG REPORT I, Lavonia Drafts, the attending physician, personally viewed and interpreted this ECG.  Date: 11/24/2020  Rhythm: normal sinus rhythm QRS Axis: normal Intervals: Nonspecific changes ST/T Wave abnormalities: normal Narrative Interpretation: no evidence of acute ischemia  ____________________________________________  RADIOLOGY  Chest x-ray reviewed by me, consistent with pulmonary edema ____________________________________________   PROCEDURES  Procedure(s) performed: No  Procedures   Critical Care performed: No ____________________________________________   INITIAL IMPRESSION / ASSESSMENT AND PLAN / ED COURSE  Pertinent labs & imaging results that were available during my care of the patient were reviewed by me and considered in my medical decision making (see chart for details).   Patient with extensive past medical history as detailed below, status post PCI 10 days ago presents with chest pain but more of shortness of breath complaint, she reports it is much worse with lying down.  She is having difficulty sleeping.  She is not on home O2 reportedly.  No fevers or chills  Suspicious for CHF  High sensitive troponin is in line with prior levels, EKG overall unchanged.  Lab work demonstrates a BNP of 169.  Patient does have a chest x-ray that is consistent with pulmonary edema, will treat with Lasix, patient will require admission      ____________________________________________   FINAL CLINICAL IMPRESSION(S) / ED DIAGNOSES  Final diagnoses:  Chest pain, unspecified type  Chronic pulmonary edema        Note:  This document was prepared using Dragon voice  recognition software and may include unintentional dictation errors.    Lavonia Drafts, MD 11/24/20 [phone]

## 2020-11-24 NOTE — H&P (Signed)
History and Physical    PLEASE NOTE THAT DRAGON DICTATION SOFTWARE WAS USED IN THE CONSTRUCTION OF THIS NOTE.   [name] CHE:[id number] DOB: 1946/04/23 DOA: 11/24/2020  PCP: Rusty Aus, MD Patient coming from: home   I have personally briefly reviewed patient's old medical records in [place]  Chief Complaint: sob  HPI: [name] is a 75 y.o. 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   The patient reports 3 to 4 days of progressive shortness of breath associated with new onset edema in the bilateral lower extremities as well as orthopnea.  The patient is unsure as to any associated changes in her weight over the above timeframe, as she acknowledges that she does not regularly weigh herself as an outpatient.  She also notes associated mild, mostly nonproductive cough.  Denies any associated subjective fever, chills, rigors, or generalized myalgias.  Denies any associated chest pain, nausea/vomiting, diaphoresis, palpitations, dizziness, presyncope, or syncope.  Not associate with any wheezing, hemoptysis, new lower extremity erythema, or calf tenderness.  No recent trauma or travel.  Denies any recent melena or hematochezia.  No recent headache, neck stiffness, sore throat, abdominal pain, diarrhea, or rash.  No known recent COVID-19 exposures.  She also denies any acute urinary symptoms, including no recent dysuria, gross  hematuria, or change in urinary urgency/frequency.  Cardiac history notable for CAD status post CABG in May 2015, PCI with balloon angioplasty to the proximal circumflex in December 2017.  Subsequently, she underwent an coronary angiography with Dr. Nehemiah Massed of [facility] cardiology on 11/03/2020, with notable findings including proximal RCA lesion associated 80 percent stenosis, distal RCA lesion 95% stenosis, origin to proximal graft lesion 80% stenosed, 1st proximal circumflex lesion 95% stenosed, 2nd proximal circumflex lesion 50% stenosis, as well as a lesion associate with the proximal circumflex to the mid circumflex associated with 65% stenosis.  Subsequently, the patient underwent PCI with stent placement, although the specific type of stent as well as the location of the replacement is not currently clear to me, and the patient believes that she underwent this PCI with stent placement on 11/14/2020.  The patient reports that subsequent to these most recently placed stents, that she has been on dual antiplatelet therapy with daily baby aspirin as well as Plavix 75 mg PO qday, and reports good compliance with his dual antiplatelet therapy. TEE performed at the time of her coronary angiography on 11/03/2020 reportedly demonstrated LVEF 60 to 65%.  Per chart review, the patient has a history of chronic diastolic heart failure, with most recent prior TTE, which was performed in April 2019, demonstrating left ventricular cavity of normal size, mild LVH, LVEF 55 to 60%, no evidence of focal wall motion normalities, grade 2 diastolic dysfunction, normal functioning bioprosthetic aortic valve, mildly dilated left atrium.  The patient reports that she is on no scheduled diuretic medications as an outpatient, noting that her Lasix is as needed only.  In the setting of a documented history of first-degree AV block second-degree AV block,  Mobitz type I, the patient confirms that she is not on any beta-blocker as an  outpatient, but reports good compliance with her home losartan.  She also has a history of COPD with good compliance on her outpatient respiratory regimen consisting of Anoro Ellipta as well as as needed albuterol inhaler.  She confirms that she is a former smoker, having completely quit smoking in 2015.     ED Course:  Vital signs in the ED were notable for the following: Temperature max 98.5, heart rate 76-80; blood pressure 131/64 -155/71; respiratory rate 22-24; oxygen saturation 97 to 100% on room air.  Labs were notable for the following: BMP notable for sodium 133 relative to most recent prior value of 135 on 11/05/2020, potassium 5.1, bicarbonate 29, BUN 20, creatinine 1.07 relative to most recent prior value 1.09 on 11/05/2020, glucose 163.  Serum magnesium level 1.7.  High-sensitivity troponin I initially noted to be 34, with repeat value trending down to 32.  This is relative to most recent prior high-sensitivity troponin I value of 37 when checked on 11/05/2020.  BNP 170.  CBC notable for the following: White blood cell count 8800, hemoglobin 9.6 relative to 9.5 on 11/05/2020, with presenting hemoglobin associated with normocytic/normochromic findings as well as nonelevated RDW.  Nasopharyngeal COVID-19/influenza PCR performed in the ED today, with results currently pending.  EKG performed today, in comparison to most recent prior performed on 11/05/2020, demonstrated sinus rhythm with first-degree AV block, heart rate 75, PR 236, QTc 515 ms, T wave flattening in aVL relative T wave inversions seen previously on 11/05/2020, no evidence of ST changes, including no evidence of ST elevation. CXR demonstrated cardiomegaly with pulmonary edema in the absence of evidence of infiltrate, effusion, or pneumothorax.  Subsequently, the patient was admitted to the med telemetry floor for further evaluation and management of suspected presenting acute on chronic diastolic heart failure, including need for IV  diuresis.     Review of Systems: As per HPI otherwise 10 point review of systems negative.   Past Medical History:  Diagnosis Date   1st degree AV block    ACE-inhibitor cough    Allergic rhinitis    Anemia    iron deficiency anemia   Anxiety    Aortic ectasia (HCC)    a. CT abd in 12/2016 incidentally noted aortic atherosclerosis and infrarenal abdominal aortic ectasia measuring as large as 2.7 cm with recommendation to repeat US in 2023.   Arthritis    Asthma    Cataract    Chronic depression    Chronic diastolic CHF (congestive heart failure) (HCC)    Chronic headache    COPD (chronic obstructive pulmonary disease) (HCC)    Coronary artery disease    a. DES to RCA and mid Cx 2009. b. CABG and bioprosthetic AVR May 2015. c. cutting balloon to prox Cx in 05/2016   Diabetes mellitus    type 2   Diverticulitis of colon    Essential hypertension    GERD (gastroesophageal reflux disease)    Hearing loss    History of blood transfusion 2013   History of prosthetic aortic valve replacement    HOH (hard of hearing)    Hypercholesterolemia    intolerance of statins and niaspan   IDA (iron deficiency anemia) 02/03/2019   Mobitz type 1 second degree AV block    OSA (obstructive sleep apnea)    mild, intolerant of cpap   PAD (peripheral artery disease) ([place])    a. atherosclerosis  by CT abd 12/2016 in [place].   PONV (postoperative nausea and vomiting)    Statin intolerance    Thyroid disease     Past Surgical History:  Procedure Laterality Date   ABDOMINAL HYSTERECTOMY     ABDOMINAL HYSTERECTOMY W/ PARTIAL VAGINACTOMY     AORTIC VALVE REPLACEMENT N/A 10/12/2013   Procedure: AORTIC VALVE REPLACEMENT (AVR);  Surgeon: [name], MD;  Location: [place];  Service: Open Heart Surgery;  Laterality: N/A;   APPENDECTOMY  1964   BARTHOLIN GLAND CYST EXCISION     BLADDER SUSPENSION     BREAST BIOPSY Bilateral 09/11/2000   neg   BREAST BIOPSY Left 07/24/2010   neg   BREAST CYST EXCISION   1988   bilateral nonmalignant tumors, x3   CARDIAC CATHETERIZATION     CARDIAC CATHETERIZATION N/A 05/25/2016   Procedure: Coronary Balloon Angioplasty;  Surgeon: Leonie Man, MD;  Location: [place] [facility];  Service: Cardiovascular;  Laterality: N/A;   CARDIAC CATHETERIZATION N/A 05/25/2016   Procedure: Coronary/Graft Angiography;  Surgeon: Leonie Man, MD;  Location: [place] [facility];  Service: Cardiovascular;  Laterality: N/A;   CATARACT EXTRACTION W/ INTRAOCULAR LENS  IMPLANT, BILATERAL     CHOLECYSTECTOMY  2001   COLECTOMY     lap sigmoid   COLONOSCOPY  2014   polyps found, 2 clamped off.   CORONARY ANGIOPLASTY  10/29/2007   Prox RCA & Mid Cx.   CORONARY ARTERY BYPASS GRAFT N/A 10/12/2013   Procedure: CORONARY ARTERY BYPASS GRAFT TIMES TWO;  Surgeon: [name], MD;  Location: [place] [facility];  Service: Open Heart Surgery;  Laterality: N/A;   CORONARY/GRAFT ANGIOGRAPHY N/A 09/20/2017   Procedure: CORONARY/GRAFT ANGIOGRAPHY;  Surgeon: Sherren Mocha, MD;  Location: [place] [facility];  Service: Cardiovascular;  Laterality: N/A;   LEFT HEART CATHETERIZATION WITH CORONARY ANGIOGRAM N/A 10/09/2013   Procedure: LEFT HEART CATHETERIZATION WITH CORONARY ANGIOGRAM;  Surgeon: [name], MD;  Location[facility] C[facility];  Service: Cardiovascular;  Laterality: N/A;   RIGHT/LEFT HEART CATH AND CORONARY ANGIOGRAPHY N/A 11/03/2020   Procedure: RIGHT/LEFT HEART CATH AND CORONARY ANGIOGRAPHY;  Surgeon: Corey Skains, MD;  Location: [place] [facility];  Service: Cardiovascular;  Laterality: N/A;   STERNAL WIRES REMOVAL N/A 04/13/2014   Procedure: STERNAL WIRES REMOVAL;  Surgeon: [name], MD;  Location: [place];  Service: Thoracic;  Laterality: N/A;   TEE WITHOUT CARDIOVERSION N/A 11/03/2020   Procedure: TRANSESOPHAGEAL ECHOCARDIOGRAM (TEE);  Surgeon: Corey Skains, MD;  Location: [facility];  Service: Cardiovascular;  Laterality: N/A;   THYROIDECTOMY     TONSILLECTOMY     TUBAL  LIGATION     VAGINAL DELIVERY     3   VISCERAL ARTERY INTERVENTION N/A 08/16/2016   Procedure: Visceral Artery Intervention;  Surgeon: Algernon Huxley, MD;  Location: [place] [facility];  Service: Cardiovascular;  Laterality: N/A;    Social History:  reports that she quit smoking about 7 years ago. Her smoking use included cigarettes. She has a 15.00 pack-year smoking history. She has never used smokeless tobacco. She reports that she does not drink alcohol and does not use drugs.   Allergies  Allergen Reactions   Amitriptyline Other (See Comments)    Unknown reaction   Benadryl [Diphenhydramine] Shortness Of Breath   Demerol [Meperidine] Other (See Comments)    Unknown reaction   Gabapentin Other (See Comments)    Unknown reaction   Loratadine Other (See Comments)  Unknown reaction   Meperidine Hcl Other (See Comments)    Unknown reaction   Mirtazapine Other (See Comments)    Unknown reaction   Olanzapine Other (See Comments)    Unknown reaction    Voltaren [Diclofenac Sodium] Shortness Of Breath   Zetia [Ezetimibe] Other (See Comments)    Weakness in legs, shakiness all over   Ativan [Lorazepam] Other (See Comments)    Causes double vision at highter than .5 mg dose   Atorvastatin Other (See Comments)    Muscle aches and weakness   Budesonide-Formoterol Fumarate Other (See Comments)    Shakiness, tremors   Bupropion Hcl Other (See Comments)    "cloud over me" depression   Caffeine Other (See Comments)    jitters   Codeine Sulfate Other (See Comments)    Makes chest hurt like a heart attack   Lisinopril Cough   Metformin Nausea And Vomiting   Mometasone Furoate Nausea And Vomiting   Morphine Sulfate Other (See Comments)    Chest pain like a heart attack   Other Other (See Comments)    Beta Blockers, reaction shortness of breath   Oxycodone-Acetaminophen Nausea And Vomiting   Pioglitazone Other (See Comments)    Cannot take because of risk of bladder cancer    Propoxyphene N-Acetaminophen Nausea And Vomiting   Rosuvastatin Other (See Comments)    Muscle aches and weakness   Shellfish Allergy Diarrhea   Suvorexant Other (See Comments)    Jerking/nervous    Ticagrelor     Other reaction(s): Other (See Comments) "slowed heart rate" & chest pain   Tramadol Nausea Only   Trazodone And Nefazodone Nausea And Vomiting   Venlafaxine Other (See Comments)    Unknown reaction   Zolpidem Tartrate Other (See Comments)     Jittery, diarrhea   Latex Rash    Family History  Problem Relation Age of Onset   Breast cancer Mother 2   Hypertension Father    Mesothelioma Father    Asthma Father    Stroke Paternal Grandfather    Heart disease Other    Breast cancer Maternal Aunt    Breast cancer Paternal Aunt     Family history reviewed and not pertinent    Prior to Admission medications   Medication Sig Start Date End Date Taking? Authorizing Provider  acetaminophen (TYLENOL) 500 MG tablet Take 500-1,000 mg by mouth every 6 (six) hours as needed (pain).    [provider]  albuterol (PROVENTIL) (2.5 MG/3ML) 0.083% nebulizer solution Take 2.5 mg by nebulization every 4 (four) hours as needed for shortness of breath or wheezing. 06/07/20   [provider]  aspirin EC 81 MG EC tablet Take 1 tablet (81 mg total) by mouth daily. Swallow whole. 08/10/20   Samuella Cota, MD  Calcium-Vitamin D (CALTRATE 600 PLUS-VIT D PO) Take 1 tablet by mouth 2 (two) times daily.    [provider]  DULoxetine (CYMBALTA) 20 MG capsule Take 2 capsules (40 mg total) by mouth daily. Patient taking differently: Take 20 mg by mouth in the morning. 09/08/20   Fritzi Mandes, MD  FLUoxetine (PROZAC) 40 MG capsule Take 40 mg by mouth in the morning. 08/23/20   [provider]  furosemide (LASIX) 20 MG tablet Take 1 tablet (20 mg total) by mouth daily. Patient taking differently: Take 20 mg by mouth daily as needed (fluid retention). 09/08/20 09/08/21   Fritzi Mandes, MD  glipiZIDE (GLUCOTROL XL) 10 MG 24 hr tablet Take 10 mg  by mouth in the morning. 07/19/20   [provider]  insulin NPH-regular Human (NOVOLIN 70/30) (70-30) 100 UNIT/ML injection Inject 100 Units into the skin 2 (two) times daily with a meal. Inject 100u under the skin every morning at breakfast and inject 100u under the skin every evening with supper    [provider]  isosorbide mononitrate (IMDUR) 30 MG 24 hr tablet Take 1 tablet (30 mg total) by mouth daily. 11/03/20 11/03/21  Corey Skains, MD  levothyroxine (SYNTHROID, LEVOTHROID) 125 MCG tablet Take 125 mcg by mouth daily before breakfast. 04/03/17   [provider]  losartan (COZAAR) 25 MG tablet Take 1 tablet (25 mg total) by mouth daily. Patient taking differently: Take 25 mg by mouth in the morning. 08/10/20   Samuella Cota, MD  nitroGLYCERIN (NITROSTAT) 0.4 MG SL tablet Place 1 tablet (0.4 mg total) under the tongue every 5 (five) minutes as needed for chest pain. Please make overdue appt with Dr. Angelena Form before anymore refills. 1st attempt Patient not taking: Reported on 11/03/2020 09/08/20   Fritzi Mandes, MD  pantoprazole (PROTONIX) 40 MG tablet Take 40 mg by mouth in the morning.    [provider]  potassium chloride (KLOR-CON) 10 MEQ tablet Take 1 tablet (10 mEq total) by mouth daily. Patient taking differently: Take 10 mEq by mouth daily as needed (fluid retention). 09/08/20   Fritzi Mandes, MD  promethazine (PHENERGAN) 25 MG tablet Take 25 mg by mouth every 6 (six) hours as needed for nausea or vomiting.    [provider]  traZODone (DESYREL) 100 MG tablet Take 200 mg by mouth at bedtime.    [provider]     Objective    Physical Exam: Vitals:   11/24/20 1641 11/24/20 1644 11/24/20 1721 11/24/20 1730  BP:  (!) 134/57 (!) 155/71 131/64  Pulse:  76 80 77  Resp:  (!) 24 (!) 29 (!) 22  Temp:  98.5 F (36.9 C)    TempSrc:  Oral    SpO2:  100% 97% 99%   Weight: 90.7 kg     Height: 5\' 6"  (1.676 m)       General: appears to be stated age; alert, oriented; mildly increased work of breathing noted.  Skin: warm, dry, no rash Head:  AT/ Mouth:  Oral mucosa membranes appear moist, normal dentition Neck: supple; trachea midline Heart:  RRR; did not appreciate any M/R/G Lungs: bilateral rales noted, otherwise, CTAB, did not appreciate any wheezes or rhonchi Abdomen: + BS; soft, ND, NT Vascular: 2+ pedal pulses b/l; 2+ radial pulses b/l Extremities: 2+ edema in b/l LE's; no muscle wasting, erythema, or calf tenderness Neuro: strength and sensation intact in upper and lower extremities b/l     Labs on Admission: I have personally reviewed following labs and imaging studies  CBC: Recent Labs  Lab 11/24/20 1643  WBC 8.8  HGB 9.6*  HCT 29.7*  MCV 86.6  PLT 761   Basic Metabolic Panel: Recent Labs  Lab 11/24/20 1643  NA 133*  K 5.1  CL 98  CO2 29  GLUCOSE 163*  BUN 20  CREATININE 1.07*  CALCIUM 8.7*   GFR: Estimated Creatinine Clearance: 50.8 mL/min (A) (by C-G formula based on SCr of 1.07 mg/dL (H)). Liver Function Tests: No results for input(s): AST, ALT, ALKPHOS, BILITOT, PROT, ALBUMIN in the last 168 hours. No results for input(s): LIPASE, AMYLASE in the last 168 hours. No results for input(s): AMMONIA in the last 168  hours. Coagulation Profile: No results for input(s): INR, PROTIME in the last 168 hours. Cardiac Enzymes: No results for input(s): CKTOTAL, CKMB, CKMBINDEX, TROPONINI in the last 168 hours. BNP (last 3 results) No results for input(s): PROBNP in the last 8760 hours. HbA1C: No results for input(s): HGBA1C in the last 72 hours. CBG: No results for input(s): GLUCAP in the last 168 hours. Lipid Profile: No results for input(s): CHOL, HDL, LDLCALC, TRIG, CHOLHDL, LDLDIRECT in the last 72 hours. Thyroid Function Tests: No results for input(s): TSH, T4TOTAL, FREET4, T3FREE, THYROIDAB in the last 72  hours. Anemia Panel: No results for input(s): VITAMINB12, FOLATE, FERRITIN, TIBC, IRON, RETICCTPCT in the last 72 hours. Urine analysis:    Component Value Date/Time   COLORURINE YELLOW (A) 01/17/2019 1600   APPEARANCEUR CLEAR (A) 01/17/2019 1600   APPEARANCEUR Clear 11/25/2012 1335   LABSPEC 1.015 01/17/2019 1600   LABSPEC 1.017 11/25/2012 1335   PHURINE 5.0 01/17/2019 1600   GLUCOSEU 50 (A) 01/17/2019 1600   GLUCOSEU Negative 11/25/2012 1335   HGBUR NEGATIVE 01/17/2019 1600   HGBUR negative 11/11/2009 0854   BILIRUBINUR NEGATIVE 01/17/2019 1600   BILIRUBINUR Negative 11/25/2012 1335   KETONESUR NEGATIVE 01/17/2019 1600   PROTEINUR NEGATIVE 01/17/2019 1600   UROBILINOGEN 0.2 11/11/2009 0854   NITRITE NEGATIVE 01/17/2019 1600   LEUKOCYTESUR TRACE (A) 01/17/2019 1600   LEUKOCYTESUR Negative 11/25/2012 1335    Radiological Exams on Admission: DG Chest 2 View  Result Date: 11/24/2020 CLINICAL DATA:  Chest pain and shortness of breath. EXAM: CHEST - 2 VIEW COMPARISON:  11/05/2020 FINDINGS: Post median sternotomy with aortic valve replacement. Stable cardiomegaly. Unchanged mediastinal contours. Chronic interstitial prominence likely reflecting pulmonary edema, with interval improvement from most recent imaging. No focal airspace disease or pleural effusion. No pneumothorax. Stable osseous structures. IMPRESSION: Cardiomegaly. Mild pulmonary edema, improved from most recent imaging. Electronically Signed   By: Keith Rake M.D.   On: 11/24/2020 17:38     EKG: Independently reviewed, with result as described above.    Assessment/Plan   ZARYIA MARKEL is a 75 y.o. 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    Principal Problem:   Acute on chronic diastolic (congestive) heart failure (HCC) Active Problems:   Acquired hypothyroidism   DM type 2 (diabetes mellitus, type 2) (HCC)   Essential hypertension   Chronic obstructive pulmonary disease (HCC)   Anemia   GAD (generalized anxiety disorder)   SOB (shortness of breath)   Acute hyponatremia   Prolonged QT interval     #) Acute on chronic diastolic heart failure: dx of acute decompensation on the basis of presenting 3 to 4 days of progressive shortness of breath associated with orthopnea, new onset edema in the bilateral lower extremities, evidence of increased work of breathing, presenting labs demonstrating acute hyponatremia consistent with acute hypervolemiac hypoosmolar hyponatremia, with presenting chest x-ray demonstrating cardiomegaly with evidence of pulmonary edema in the absence of evidence of infiltrate, effusion, or pneumothorax. This is in the context of a known history of chronic  diastolic heart failure, with most recent TTE, which was performed in April 2019, and associated with grade 2 diastolic dysfunction, with additional findings as further detailed above. Etiology leading to presenting acutely decompensated heart failure is currently unclear.  While noting recent PCI with stent placement approximately 10 days ago, and good interval compliance with dual antiplatelet therapy, ACS appears less likely at this time in the absence of any recent chest pain as well as EKG showing no evidence of acute ischemic changes, as further detailed above.  Additionally, high-sensitivity troponin I continues to trend down relative to her pre-PCI value on 11/05/20, with initial  high-sensitivity troponin I today found to be 34, with repeat value demonstrating a further trend down to 32.  Per the patient, she is not on any scheduled diuretics as an outpatient, with patient reporting that her home Lasix as needed only.Not on a BB as outpatient, as further detailed above.  Consequently, presentation warrants IV diuresis, as further detailed below, with close monitoring of ensuing renal function, electrolytes, and volume status, as further noted below.  Additionally, per chart review, there appears to be documentation a history of pulmonary hypertension, which will necessitate a more gentle approach to IV diuresis to avoid exacerbation of any related related dependent component associate with the patient's underlying physiology.  Consistent with his home also hold home Imdur for now. Of note, I utilized the Heart Failure order set to assist with my placement of orders on this patient.  In the setting of presenting serum mag found to be 1.7, with plan for subsequent IV diuresis, particularly in the setting of evidence of prolonged QTC, will provide IV magnesium supplementation at this time, as further quantified below.    Plan: monitor strict I's & O's and daily weights. Monitor on telemetry, including trend in HR in response to diuresis. Monitor continuous pulse oximetry. Repeat BMP in the morning, including for monitoring trend of potassium, bicarbonate, and renal function in response to interval diuresis efforts.  Magnesium sulfate 2 g IV every 2 hours x1 dose now, as above.  Repeat serum magnesium level in the morning. Close monitoring of ensuing blood pressure response to diuresis efforts, particularly in the setting of documented history of pulmonary hypertension.  Lasix 20 mg IV twice daily, first dose this evening.  Continue on losartan while holding home Imdur, as further detailed above.  Echocardiogram is been ordered for the morning, and assigned to leadership via [facility]  cardiology.as component of Qtc prolongation evaluation/management, will also repeat EKG in the morning.      #) Acute hypoosmolar hyponatremia: In the setting of suspected acute on chronic diastolic heart failure, suspect presenting acute hyponatremia is hypervolemic in nature.  Will closely monitor ensuing serum sodium trend in response to plan for IV diuresis, as above.  Of note, patient also has a history of acquired hypothyroidism, on Synthroid as an outpatient.   Plan: Monitor strict I's and O's and daily weights.  IV diuresis, as above.  Check urinalysis as well as random urine sodium, urine osmolality, and TSH.  Check serum osmolality to confirm suspected hypoosmolar etiology.  Repeat BMP in the morning.  Check TSH.       #) QTC prolongation: This evening was EKG demonstrates QTC of 515 ms. potential pharmacologic contribution to this finding in the setting of report of taking both Cymbalta as well as Prozac as an outpatient.  Of note, in the setting of presenting acute on chronic diastolic heart failure and plan for subsequent IV  diuresis, and with initial serum magnesium level found to be 1.7, will initiate IV magnesium supplementation at this time, as quantified below.  Plan: Hold home Prozac and Cymbalta for now.  Monitor on telemetry.  Magnesium sulfate 2 g IV over 2 hours x 1 dose now.  Repeat serum magnesium level in the morning.  Repeat EKG in the morning to further trend QTC prolongation.       #) Type 2 diabetes mellitus: On Novolin 70/30 insulin as an outpatient, for which she takes 100 units with breakfast and 100 units with supper.  Additionally, she is on glipizide at home.  Presenting blood sugar found be 163.  Most recent hemoglobin A1c noted to be 6.6% on 08/08/2020.  In terms of starting point  for dose of  basal insulin to be initiated during this hospitalization, we will start conservatively, with close monitoring of ensuing blood sugars, as further detailed  below.  Plan: Levemir 20 units subcu twice daily, with first dose now.  Accu-Cheks before every meal and at bedtime with moderate dose sliding scale insulin.  Hold home glipizide during this hospitalization.      #) Generalized anxiety disorder: Appears to be on both Cymbalta and Prozac as an outpatient, which will be held for now in the setting of finding of QTC prolongation, as above.  Of note, it appears that Ativan is included in the patient's list of medications to which she is experienced an adverse response.  Plan: Hold home Cymbalta and Prozac for now, as above.  Repeat EKG in the morning.  Consider as needed hydroxyzine for breakthrough anxiety.  Check TSH, particular in setting of presenting acute hyponatremia.      #) COPD: Documented history of such, with the patient confirming that she is a former smoker, having completely quit smoking in 2015 without subsequent resumption.  Palpation respiratory regimen consists of Anoro Ellipta as well as as needed albuterol inhaler.  While the patient does present with 3 to 4 days of progressive shortness of breath, her presentation appears much more suggestive clinically and radiographically of acute on chronic diastolic heart failure, particular in the setting of associated orthopnea, new onset edema involving the bilateral lower extremities, and chest x-ray showing evidence of pulmonary edema.  While she is certainly at risk for secondary acute COPD exacerbation as a consequence of acutely considered for her, she does not overtly appear to be in an acute COPD exacerbation at this time.  Plan: Continue home Anora Ellipta as well as as needed albuterol inhaler.  Monitor on continuous pulse oximetry.  Add on serum magnesium level as well as check serum phosphorus level.  will attempt additional chart review to review most recent prior pulmonary function test results.  Check VBG.  Further evaluation management of suspected presenting acute on chronic  diastolic heart failure, as above.        #) Acquired hypothyroidism: On Synthroid as an outpatient.  Plan: In the setting of presenting acute hyponatremia, will check TSH, as further detailed above.  For now, continue home dose of Synthroid.        #) Chronic anemia: Documented history of such, with associated baseline hemoglobin range of 9.5-11, with presenting hemoglobin of 9.6 found to be within this range, and stable relative to most recent prior value of 9.5 on 11/05/2020.  Per chart review, there appears to be documentation that the patient's chronic anemia is iron deficiency in nature.  No evidence of active bleed at this time, including presenting  hemoglobin that is associated with normocytic/normochromic findings as well as nonelevated RDW.  Plan: Repeat CBC in the morning.      DVT prophylaxis: SCDs Code Status: Full code Family Communication: none Disposition Plan: Per Rounding Team Consults called: none  Admission status: Inpatient; med telemetry     Of note, this patient was added by me to the following Admit List/Treatment Team: [facility].      PLEASE NOTE THAT DRAGON DICTATION SOFTWARE WAS USED IN THE CONSTRUCTION OF THIS NOTE.   [place] [facility] Pager [phone] From [place]  Otherwise, please contact night-coverage  [email or web address] Password [facility]   11/24/2020, 6:57 PM

## 2020-11-24 NOTE — ED Notes (Signed)
Placed on [facility] supplemental oxygen for pt comfort.

## 2020-11-24 NOTE — ED Triage Notes (Signed)
Pt comes into the ED via [facility] clinic for CP and [place].  Pt states that she has 3L O2 at home she can wear PRN.  Pt presents labored in breathing but saturating well at 98% RA.  Pt able to be redirected and breathing naturally slows.  Pt states she has 3 catheterizations done last week d/t blockages.

## 2020-11-24 NOTE — Progress Notes (Signed)
Brief note regarding plan, with full H&P to follow:  75 year old female with history of chronic diastolic heart failure, pulmonary hypertension, coronary artery disease status post PCI earlier this month, who is admitted with suspected acute on chronic diastolic heart failure after presenting with 1 to 2 days of shortness of breath, with chest x-ray consistent with pulmonary edema.  We will provide gentle IV diuresis in the context of a history of pulmonary hypertension in order to be cognizant of potential preload dependent component of her physiology.  Lasix 20 mg IV twice daily has been ordered.  Close monitoring of ensuing blood pressure and response to these IV diuresis efforts.  Presentation not associated with any chest pain.  Initial troponin trending down from most recent prior value, which was checked a few weeks ago.  Given history of CAD and recent PCI, will continue to trend troponin and monitor closely on telemetry.    Babs Bertin, DO Hospitalist

## 2020-11-24 NOTE — ED Notes (Signed)
Pt given crackers and a cup of ice water 

## 2020-11-25 ENCOUNTER — Inpatient Hospital Stay
Admit: 2020-11-25 | Discharge: 2020-11-25 | Disposition: A | Discharge status: Home / Self Care | Payer: Medicare HMO | Attending: Internal Medicine | Admitting: Internal Medicine

## 2020-11-25 ENCOUNTER — Encounter: Payer: Self-pay | Admitting: Internal Medicine

## 2020-11-25 DIAGNOSIS — R0602 Shortness of breath: Secondary | ICD-10-CM

## 2020-11-25 LAB — SARS CORONAVIRUS 2 (TAT 6-24 HRS): SARS Coronavirus 2: NEGATIVE

## 2020-11-25 LAB — URINALYSIS, ROUTINE W REFLEX MICROSCOPIC
Bacteria, UA: NONE SEEN
Bilirubin Urine: NEGATIVE
Glucose, UA: 50 mg/dL — AB
Ketones, ur: NEGATIVE mg/dL
Leukocytes,Ua: NEGATIVE
Nitrite: NEGATIVE
Protein, ur: NEGATIVE mg/dL
Specific Gravity, Urine: 1.003 — ABNORMAL LOW (ref 1.005–1.030)
pH: 6 (ref 5.0–8.0)

## 2020-11-25 LAB — COMPREHENSIVE METABOLIC PANEL
ALT: 17 U/L (ref 0–44)
AST: 32 U/L (ref 15–41)
Albumin: 3.2 g/dL — ABNORMAL LOW (ref 3.5–5.0)
Alkaline Phosphatase: 82 U/L (ref 38–126)
Anion gap: 6 (ref 5–15)
BUN: 17 mg/dL (ref 8–23)
CO2: 29 mmol/L (ref 22–32)
Calcium: 8.8 mg/dL — ABNORMAL LOW (ref 8.9–10.3)
Chloride: 101 mmol/L (ref 98–111)
Creatinine, Ser: 0.95 mg/dL (ref 0.44–1.00)
GFR, Estimated: >60 mL/min (ref >60)
Glucose, Bld: 248 mg/dL — ABNORMAL HIGH (ref 70–99)
Potassium: 4.3 mmol/L (ref 3.5–5.1)
Sodium: 136 mmol/L (ref 135–145)
Total Bilirubin: 0.5 mg/dL (ref 0.3–1.2)
Total Protein: 6.7 g/dL (ref 6.5–8.1)

## 2020-11-25 LAB — GLUCOSE, CAPILLARY
Glucose-Capillary: 203 mg/dL — ABNORMAL HIGH (ref 70–99)
Glucose-Capillary: 340 mg/dL — ABNORMAL HIGH (ref 70–99)
Glucose-Capillary: 293 mg/dL — ABNORMAL HIGH (ref 70–99)
Glucose-Capillary: 310 mg/dL — ABNORMAL HIGH (ref 70–99)
Glucose-Capillary: 350 mg/dL — ABNORMAL HIGH (ref 70–99)

## 2020-11-25 LAB — MAGNESIUM: Magnesium: 2.1 mg/dL (ref 1.7–2.4)

## 2020-11-25 LAB — BLOOD GAS, VENOUS
Acid-Base Excess: 3.3 mmol/L — ABNORMAL HIGH (ref 0.0–2.0)
Bicarbonate: 28.5 mmol/L — ABNORMAL HIGH (ref 20.0–28.0)
O2 Saturation: 97.7 %
Patient temperature: 37
pCO2, Ven: 45 mmHg (ref 44.0–60.0)
pH, Ven: 7.41 (ref 7.250–7.430)
pO2, Ven: 98 mmHg — ABNORMAL HIGH (ref 32.0–45.0)

## 2020-11-25 LAB — PHOSPHORUS: Phosphorus: 4.8 mg/dL — ABNORMAL HIGH (ref 2.5–4.6)

## 2020-11-25 LAB — CBC
HCT: 30.1 % — ABNORMAL LOW (ref 36.0–46.0)
Hemoglobin: 9.7 g/dL — ABNORMAL LOW (ref 12.0–15.0)
MCH: 28.1 pg (ref 26.0–34.0)
MCHC: 32.2 g/dL (ref 30.0–36.0)
MCV: 87.2 fL (ref 80.0–100.0)
Platelets: 153 10*3/uL (ref 150–400)
RBC: 3.45 MIL/uL — ABNORMAL LOW (ref 3.87–5.11)
RDW: 13.6 % (ref 11.5–15.5)
WBC: 7.7 10*3/uL (ref 4.0–10.5)
nRBC: 0 % (ref 0.0–0.2)

## 2020-11-25 LAB — ECHOCARDIOGRAM COMPLETE
Height: 66 in
S' Lateral: 3.37 cm
Weight: 3199.32 oz

## 2020-11-25 LAB — OSMOLALITY, URINE: Osmolality, Ur: 156 mosm/kg — ABNORMAL LOW (ref 300–900)

## 2020-11-25 LAB — SODIUM, URINE, RANDOM: Sodium, Ur: 38 mmol/L

## 2020-11-25 MED ORDER — PROCHLORPERAZINE EDISYLATE 10 MG/2ML IJ SOLN
10.0000 mg | Freq: Once | INTRAMUSCULAR | Status: AC
  Administered 2020-11-25: 10 mg via INTRAVENOUS
  Filled 2020-11-25: qty 2

## 2020-11-25 MED ORDER — ALPRAZOLAM 0.25 MG PO TABS
0.2500 mg | ORAL_TABLET | Freq: Two times a day (BID) | ORAL | Status: DC | PRN
  Administered 2020-11-25 (×2): 0.25 mg via ORAL
  Filled 2020-11-25 (×2): qty 1

## 2020-11-25 MED ORDER — INSULIN ASPART PROT & ASPART (70-30 MIX) 100 UNIT/ML ~~LOC~~ SUSP
60.0000 [IU] | Freq: Two times a day (BID) | SUBCUTANEOUS | Status: DC
  Administered 2020-11-25 – 2020-11-26 (×2): 60 [IU] via SUBCUTANEOUS
  Filled 2020-11-25 (×2): qty 10

## 2020-11-25 MED ORDER — LORATADINE 10 MG PO TABS
10.0000 mg | ORAL_TABLET | Freq: Every day | ORAL | Status: DC | PRN
  Administered 2020-11-25: 10 mg via ORAL
  Filled 2020-11-25: qty 1

## 2020-11-25 MED ORDER — SALINE SPRAY 0.65 % NA SOLN
1.0000 | NASAL | Status: DC | PRN
  Administered 2020-11-25: 1 via NASAL
  Filled 2020-11-25: qty 44

## 2020-11-25 MED ORDER — INSULIN ASPART PROT & ASPART (70-30 MIX) 100 UNIT/ML ~~LOC~~ SUSP: 80.0000 [IU] | Freq: Two times a day (BID) | SUBCUTANEOUS | Status: DC

## 2020-11-25 MED ORDER — ACETAMINOPHEN 325 MG PO TABS
650.0000 mg | ORAL_TABLET | Freq: Four times a day (QID) | ORAL | Status: DC | PRN
  Administered 2020-11-25: 650 mg via ORAL
  Filled 2020-11-25: qty 2

## 2020-11-25 MED ORDER — OXYCODONE HCL 5 MG PO TABS
5.0000 mg | ORAL_TABLET | Freq: Once | ORAL | Status: AC
  Administered 2020-11-25: 5 mg via ORAL
  Filled 2020-11-25: qty 1

## 2020-11-25 NOTE — Consult Note (Signed)
   Heart Failure Nurse Navigator Note  HFpEF 55-60%.  Grade II Diastolic Dysfunction.  Comorbidities:  COPD Hypertension Hyperlipidemia Diabetes Coronary artery disease with bypass grafting Arthritis Anemia Obstructive sleep apnea intolerant of CPAP  She continues to cancel her appointments that are made for her in the heart failure clinic.  Medications:  Aspirin 81 mg daily Plavix 75 mg daily Furosemide 20 mg twice daily  losartan 25 mg daily   Labs:  Sodium 136, potassium 4.3, chloride 101, CO2 29, BUN 17, creatinine 0.95, BNP 196 Intake 160 mL  Output 1000 ml Weight is 90.7 kg  Met with patient today, she was last hospitalized in April 2022.  She admits to not weighing herself daily.  Had a long discussion with her today.  She would make the initiative of weighing herself daily and recording and then reporting weight gain or changes in symptoms to her physicians she would be able to avoid these frequent hospitalizations.  Also discussed her diet, she continues to eat food types that are inappropriate such as canned hash etc.  Pricilla Riffle RN [facility]

## 2020-11-25 NOTE — Progress Notes (Addendum)
Progress Note    [name]  FEO:[id number] DOB: 02/24/1946  DOA: 11/24/2020 PCP: Rusty Aus, MD      Brief Narrative:    Medical records reviewed and are as summarized below:  [name] is a 75 y.o. female with medical history significant for CAD s/p CABG, s/p PCI without stent placement on 08/03/5496, chronic diastolic CHF, hypothyroidism, type II DM, hypertension, COPD, chronic hypoxic respiratory failure on 3 L/min oxygen at home as needed, obesity, anxiety, chronic anemia, who presented to the hospital with shortness of breath.      Assessment/Plan:   Principal Problem:   Acute on chronic diastolic (congestive) heart failure (HCC) Active Problems:   Acquired hypothyroidism   DM type 2 (diabetes mellitus, type 2) (HCC)   Essential hypertension   Chronic obstructive pulmonary disease (HCC)   Anemia   GAD (generalized anxiety disorder)   SOB (shortness of breath)   Acute hyponatremia   Prolonged QT interval    Body mass index is 32.27 kg/m.  (Obesity)   Acute on chronic diastolic CHF: Continue IV Lasix.  Monitor BMP, urine output and daily weight.  2D echo showed EF estimated at 60 to 65%, indeterminate LV diastolic parameters, mild MR, mild TR.  Insulin-dependent diabetes mellitus with hyperglycemia: Patient said she takes Novolin 70/30 60 units twice daily at home although 100 units twice daily was listed on her admission med rec.  Pharmacist was notified and progression to make the correction.  CAD s/p CABG, s/p PCI without stent placement on 11/07/2020: Continue aspirin, Plavix and Lipitor.  TEE on 11/04/2019 also showed moderate aortic stenosis, mild mitral stenosis and severe aortic arch atherosclerosis  Hyponatremia: Improved  Prolonged QTc interval: Improved from 515 to 479.  Other comorbidities include anxiety, hypothyroidism, hypertension, COPD, chronic anemia   Diet Order             Diet heart healthy/carb modified Room service  appropriate? Yes; Fluid consistency: Thin  Diet effective now                      Consultants: None  Procedures: None    Medications:    aspirin EC  81 mg Oral Daily   clopidogrel  75 mg Oral Daily   furosemide  20 mg Intravenous BID   insulin aspart  0-15 Units Subcutaneous TID WC   insulin aspart  0-5 Units Subcutaneous QHS   insulin aspart protamine- aspart  60 Units Subcutaneous BID WC   levothyroxine  125 mcg Oral QAC breakfast   losartan  25 mg Oral q AM   pantoprazole  40 mg Oral q AM   umeclidinium-vilanterol  1 puff Inhalation Daily   Continuous Infusions:   Anti-infectives (From admission, onward)    None              Family Communication/Anticipated D/C date and plan/Code Status   DVT prophylaxis: SCDs Start: 11/24/20 2243 SCDs Start: 11/24/20 1850     Code Status: Full Code  Family Communication: Husband at the bedside Disposition Plan:    Status is: Inpatient  Remains inpatient appropriate because:IV treatments appropriate due to intensity of illness or inability to take PO  Dispo: The patient is from: Home              Anticipated d/c is to: Home              Patient currently is not medically stable to d/c.  Difficult to place patient No           Subjective:   Interval events noted.  She complains of cough.  Breathing is a little better.  Her husband was at the bedside.  Objective:    Vitals:   11/25/20 0414 11/25/20 0427 11/25/20 0827 11/25/20 1150  BP:  (!) 134/57 139/64 (!) 124/55  Pulse:  71 75 79  Resp:   18 18  Temp:  98.4 F (36.9 C) 98.6 F (37 C) 99.3 F (37.4 C)  TempSrc:   Oral   SpO2:  98% 97% 95%  Weight: 90.7 kg     Height:       No data found.   Intake/Output Summary (Last 24 hours) at 11/25/2020 1526 Last data filed at 11/25/2020 1330 Gross per 24 hour  Intake 280 ml  Output 3300 ml  Net -3020 ml   Filed Weights   11/24/20 1641 11/24/20 2321 11/25/20 0414  Weight: 90.7 kg  90.7 kg 90.7 kg    Exam:  GEN: NAD SKIN: No rash EYES: EOMI ENT: MMM CV: RRR PULM: CTA B ABD: soft, obese, NT, +BS CNS: AAO x 3, non focal EXT: No edema or tenderness        Data Reviewed:   I have personally reviewed following labs and imaging studies:  Labs: Labs show the following:   Basic Metabolic Panel: Recent Labs  Lab 11/24/20 1643 11/24/20 1852 11/25/20 0452  NA 133*  --  136  K 5.1  --  4.3  CL 98  --  101  CO2 29  --  29  GLUCOSE 163*  --  248*  BUN 20  --  17  CREATININE 1.07*  --  0.95  CALCIUM 8.7*  --  8.8*  MG  --  1.7 2.1  PHOS  --   --  4.8*   GFR Estimated Creatinine Clearance: 57.2 mL/min (by C-G formula based on SCr of 0.95 mg/dL). Liver Function Tests: Recent Labs  Lab 11/25/20 0452  AST 32  ALT 17  ALKPHOS 82  BILITOT 0.5  PROT 6.7  ALBUMIN 3.2*   No results for input(s): LIPASE, AMYLASE in the last 168 hours. No results for input(s): AMMONIA in the last 168 hours. Coagulation profile No results for input(s): INR, PROTIME in the last 168 hours.  CBC: Recent Labs  Lab 11/24/20 1643 11/25/20 0452  WBC 8.8 7.7  HGB 9.6* 9.7*  HCT 29.7* 30.1*  MCV 86.6 87.2  PLT 189 153   Cardiac Enzymes: No results for input(s): CKTOTAL, CKMB, CKMBINDEX, TROPONINI in the last 168 hours. BNP (last 3 results) No results for input(s): PROBNP in the last 8760 hours. CBG: Recent Labs  Lab 11/24/20 1921 11/24/20 2144 11/24/20 2352 11/25/20 0828 11/25/20 1147  GLUCAP 170* 301* 290* 203* 340*   D-Dimer: No results for input(s): DDIMER in the last 72 hours. Hgb A1c: No results for input(s): HGBA1C in the last 72 hours. Lipid Profile: No results for input(s): CHOL, HDL, LDLCALC, TRIG, CHOLHDL, LDLDIRECT in the last 72 hours. Thyroid function studies: Recent Labs    11/24/20 1852  TSH 5.687*   Anemia work up: No results for input(s): VITAMINB12, FOLATE, FERRITIN, TIBC, IRON, RETICCTPCT in the last 72 hours. Sepsis  Labs: Recent Labs  Lab 11/24/20 1643 11/24/20 1852 11/25/20 0452  PROCALCITON  --  0.10  --   WBC 8.8  --  7.7    Microbiology Recent Results (from the past 240 hour(s))  SARS CORONAVIRUS 2 (TAT 6-24 HRS) Nasopharyngeal Nasopharyngeal Swab     Status: None   Collection Time: 11/24/20  6:52 PM   Specimen: Nasopharyngeal Swab  Result Value Ref Range Status   SARS Coronavirus 2 NEGATIVE NEGATIVE Final    Comment: (NOTE) SARS-CoV-2 target nucleic acids are NOT DETECTED.  The SARS-CoV-2 RNA is generally detectable in upper and lower respiratory specimens during the acute phase of infection. Negative results do not preclude SARS-CoV-2 infection, do not rule out co-infections with other pathogens, and should not be used as the sole basis for treatment or other patient management decisions. Negative results must be combined with clinical observations, patient history, and epidemiological information. The expected result is Negative.  Fact Sheet for Patients: SugarRoll.be  Fact Sheet for Healthcare Providers: https://www.woods-mathews.com/  This test is not yet approved or cleared by the [place] FDA and  has been authorized for detection and/or diagnosis of SARS-CoV-2 by FDA under an Emergency Use Authorization (EUA). This EUA will remain  in effect (meaning this test can be used) for the duration of the COVID-19 declaration under Se ction 564(b)(1) of the Act, 21 U.S.C. section 360bbb-3(b)(1), unless the authorization is terminated or revoked sooner.  Performed at [facility], [place] [address], [place], [place] [address]     Procedures and diagnostic studies:  DG Chest 2 View  Result Date: 11/24/2020 CLINICAL DATA:  Chest pain and shortness of breath. EXAM: CHEST - 2 VIEW COMPARISON:  11/05/2020 FINDINGS: Post median sternotomy with aortic valve replacement. Stable cardiomegaly. Unchanged mediastinal contours. Chronic  interstitial prominence likely reflecting pulmonary edema, with interval improvement from most recent imaging. No focal airspace disease or pleural effusion. No pneumothorax. Stable osseous structures. IMPRESSION: Cardiomegaly. Mild pulmonary edema, improved from most recent imaging. Electronically Signed   By: Keith Rake M.D.   On: 11/24/2020 17:38   ECHOCARDIOGRAM COMPLETE  Result Date: 11/25/2020    ECHOCARDIOGRAM REPORT   Patient Name:   [name] Date of Exam: 11/25/2020 Medical Rec #:  [id number]     Height:       66.0 in Accession #:    [id number]    Weight:       200.0 lb Date of Birth:  1945-10-05      BSA:          2.000 m Patient Age:    78 years      BP:           139/64 mmHg Patient Gender: F             HR:           75 bpm. Exam Location:  [facility] Procedure: 2D Echo, Cardiac Doppler and Color Doppler Indications:     CHF-acute diastolic X52.84  History:         Patient has prior history of Echocardiogram examinations, most                  recent 11/03/2020. COPD; Risk Factors:Diabetes. AVR in 2015.  Sonographer:     Sherrie Sport RDCS (AE) Referring Phys:  [id number] Rhetta Mura Diagnosing Phys: Isaias Cowman MD  Sonographer Comments: No apical window and no subcostal window. Image acquisition challenging due to COPD. IMPRESSIONS  1. Left ventricular ejection fraction, by estimation, is 60 to 65%. The left ventricle has normal function. The left ventricle has no regional wall motion abnormalities. Left ventricular diastolic parameters are indeterminate.  2. Right ventricular systolic function is normal. The  right ventricular size is normal.  3. The mitral valve is normal in structure. Mild mitral valve regurgitation. No evidence of mitral stenosis.  4. The aortic valve is normal in structure. Aortic valve regurgitation is not visualized. No aortic stenosis is present.  5. The inferior vena cava is normal in size with greater than 50% respiratory variability, suggesting right atrial  pressure of 3 mmHg. FINDINGS  Left Ventricle: Left ventricular ejection fraction, by estimation, is 60 to 65%. The left ventricle has normal function. The left ventricle has no regional wall motion abnormalities. The left ventricular internal cavity size was normal in size. There is  no left ventricular hypertrophy. Left ventricular diastolic parameters are indeterminate. Right Ventricle: The right ventricular size is normal. No increase in right ventricular wall thickness. Right ventricular systolic function is normal. Left Atrium: Left atrial size was normal in size. Right Atrium: Right atrial size was normal in size. Pericardium: There is no evidence of pericardial effusion. Mitral Valve: The mitral valve is normal in structure. Mild mitral valve regurgitation. No evidence of mitral valve stenosis. Tricuspid Valve: The tricuspid valve is normal in structure. Tricuspid valve regurgitation is mild . No evidence of tricuspid stenosis. Aortic Valve: The aortic valve is normal in structure. Aortic valve regurgitation is not visualized. No aortic stenosis is present. Pulmonic Valve: The pulmonic valve was normal in structure. Pulmonic valve regurgitation is not visualized. No evidence of pulmonic stenosis. Aorta: The aortic root is normal in size and structure. Venous: The inferior vena cava is normal in size with greater than 50% respiratory variability, suggesting right atrial pressure of 3 mmHg. IAS/Shunts: No atrial level shunt detected by color flow Doppler.  LEFT VENTRICLE PLAX 2D LVIDd:         4.92 cm LVIDs:         3.37 cm LV PW:         1.68 cm LV IVS:        1.59 cm LVOT diam:     2.00 cm LVOT Area:     3.14 cm  LEFT ATRIUM         Index LA diam:    4.40 cm 2.20 cm/m                        PULMONIC VALVE AORTA                 PV Vmax:        1.11 m/s Ao Root diam: 3.20 cm PV Peak grad:   4.9 mmHg                       RVOT Peak grad: 6 mmHg   SHUNTS Systemic Diam: 2.00 cm Isaias Cowman MD  Electronically signed by Isaias Cowman MD Signature Date/Time: 11/25/2020/1:34:29 PM    Final                LOS: 1 day   Naelle Diegel  [facility]   Pager on [email or web address]. If 7PM-7AM, please contact night-coverage at [email or web address]     11/25/2020, 3:26 PM

## 2020-11-26 DIAGNOSIS — I4581 Long QT syndrome: Secondary | ICD-10-CM

## 2020-11-26 LAB — GLUCOSE, CAPILLARY: Glucose-Capillary: 263 mg/dL — ABNORMAL HIGH (ref 70–99)

## 2020-11-26 LAB — BASIC METABOLIC PANEL
Anion gap: 10 (ref 5–15)
BUN: 20 mg/dL (ref 8–23)
CO2: 28 mmol/L (ref 22–32)
Calcium: 8.7 mg/dL — ABNORMAL LOW (ref 8.9–10.3)
Chloride: 96 mmol/L — ABNORMAL LOW (ref 98–111)
Creatinine, Ser: 1.01 mg/dL — ABNORMAL HIGH (ref 0.44–1.00)
GFR, Estimated: 58 mL/min — ABNORMAL LOW (ref >60)
Glucose, Bld: 248 mg/dL — ABNORMAL HIGH (ref 70–99)
Potassium: 4.3 mmol/L (ref 3.5–5.1)
Sodium: 134 mmol/L — ABNORMAL LOW (ref 135–145)

## 2020-11-26 LAB — MAGNESIUM: Magnesium: 1.9 mg/dL (ref 1.7–2.4)

## 2020-11-26 MED ORDER — DULOXETINE HCL 20 MG PO CPEP: 20.0000 mg | ORAL_CAPSULE | Freq: Every morning | ORAL | Status: AC

## 2020-11-26 MED ORDER — POTASSIUM CHLORIDE CRYS ER 10 MEQ PO TBCR: 10.0000 meq | EXTENDED_RELEASE_TABLET | Freq: Every day | ORAL | 0 refills | Status: DC

## 2020-11-26 MED ORDER — LOSARTAN POTASSIUM 25 MG PO TABS: 25.0000 mg | ORAL_TABLET | Freq: Every morning | ORAL | Status: AC

## 2020-11-26 MED ORDER — FUROSEMIDE 20 MG PO TABS: 20.0000 mg | ORAL_TABLET | Freq: Two times a day (BID) | ORAL | 0 refills | Status: DC

## 2020-11-26 NOTE — Discharge Summary (Signed)
Physician Discharge Summary  KOLBI TOFTE UXN:[id number] DOB: 03-Nov-1945 DOA: 11/24/2020  PCP: Rusty Aus, MD  Admit date: 11/24/2020 Discharge date: 11/26/2020  Discharge disposition: Home   Recommendations for Outpatient Follow-Up:   Follow-up with PCP or cardiologist in 1 week   Discharge Diagnosis:   Principal Problem:   Acute on chronic diastolic (congestive) heart failure ([place]) Active Problems:   Acquired hypothyroidism   DM type 2 (diabetes mellitus, type 2) (HCC)   Essential hypertension   Chronic obstructive pulmonary disease (HCC)   Anemia   GAD (generalized anxiety disorder)   SOB (shortness of breath)   Acute hyponatremia   Prolonged QT interval    Discharge Condition: Stable.  Diet recommendation:  Diet Order             Diet - low sodium heart healthy           Diet Carb Modified           Diet heart healthy/carb modified Room service appropriate? Yes; Fluid consistency: Thin  Diet effective now                     Code Status: Full Code     Hospital Course:   [name] is a 75 y.o. female with medical history significant for CAD s/p CABG, s/p PCI without stent placement on 07/09/4268, chronic diastolic CHF, hypothyroidism, type II DM, hypertension, COPD, chronic hypoxic respiratory failure on 3 L/min oxygen at home as needed, obesity, anxiety, chronic anemia, who presented to the hospital with shortness of breath.  She was admitted to the hospital for acute exacerbation of chronic diastolic CHF.  She was treated with IV Lasix.  Her symptoms have improved and she is deemed stable for discharge to home today.  She has prolonged QTc interval and she is on multiple psychotropics for depression and anxiety.  She says she has been taking these medicines for a long time and was not willing to discontinue them at this point.  It was explained that severely prolonged QTc interval is associated with increased risk of cardiac arrest.   Follow-up with PCP was strongly recommended for QTC monitoring and adjustment of psychotropics.     Discharge Exam:    Vitals:   11/25/20 2042 11/26/20 0215 11/26/20 0413 11/26/20 0809  BP: (!) 144/72  (!) 128/59 (!) 123/58  Pulse: 74  82 77  Resp: 18  18 19   Temp: 98.8 F (37.1 C)  98.7 F (37.1 C) 99.1 F (37.3 C)  TempSrc:   Oral Oral  SpO2: 96%  98% 94%  Weight:  91 kg    Height:         GEN: NAD SKIN: Warm and dry EYES: No pallor or icterus ENT: MMM, hearing impairment CV: RRR PULM: CTA B ABD: soft, obese, NT, +BS CNS: AAO x 3, non focal EXT: No edema or tenderness   The results of significant diagnostics from this hospitalization (including imaging, microbiology, ancillary and laboratory) are listed below for reference.     Procedures and Diagnostic Studies:   ECHOCARDIOGRAM COMPLETE  Result Date: 11/25/2020    ECHOCARDIOGRAM REPORT   Patient Name:   [name] Date of Exam: 11/25/2020 Medical Rec #:  [id number]     Height:       66.0 in Accession #:    [id number]    Weight:       200.0 lb Date of Birth:  07/02/1945  BSA:          2.000 m Patient Age:    38 years      BP:           139/64 mmHg Patient Gender: F             HR:           75 bpm. Exam Location:  [facility] Procedure: 2D Echo, Cardiac Doppler and Color Doppler Indications:     CHF-acute diastolic O67.12  History:         Patient has prior history of Echocardiogram examinations, most                  recent 11/03/2020. COPD; Risk Factors:Diabetes. AVR in 2015.  Sonographer:     Sherrie Sport RDCS (AE) Referring Phys:  [id number] Rhetta Mura Diagnosing Phys: Isaias Cowman MD  Sonographer Comments: No apical window and no subcostal window. Image acquisition challenging due to COPD. IMPRESSIONS  1. Left ventricular ejection fraction, by estimation, is 60 to 65%. The left ventricle has normal function. The left ventricle has no regional wall motion abnormalities. Left ventricular diastolic parameters are  indeterminate.  2. Right ventricular systolic function is normal. The right ventricular size is normal.  3. The mitral valve is normal in structure. Mild mitral valve regurgitation. No evidence of mitral stenosis.  4. The aortic valve is normal in structure. Aortic valve regurgitation is not visualized. No aortic stenosis is present.  5. The inferior vena cava is normal in size with greater than 50% respiratory variability, suggesting right atrial pressure of 3 mmHg. FINDINGS  Left Ventricle: Left ventricular ejection fraction, by estimation, is 60 to 65%. The left ventricle has normal function. The left ventricle has no regional wall motion abnormalities. The left ventricular internal cavity size was normal in size. There is  no left ventricular hypertrophy. Left ventricular diastolic parameters are indeterminate. Right Ventricle: The right ventricular size is normal. No increase in right ventricular wall thickness. Right ventricular systolic function is normal. Left Atrium: Left atrial size was normal in size. Right Atrium: Right atrial size was normal in size. Pericardium: There is no evidence of pericardial effusion. Mitral Valve: The mitral valve is normal in structure. Mild mitral valve regurgitation. No evidence of mitral valve stenosis. Tricuspid Valve: The tricuspid valve is normal in structure. Tricuspid valve regurgitation is mild . No evidence of tricuspid stenosis. Aortic Valve: The aortic valve is normal in structure. Aortic valve regurgitation is not visualized. No aortic stenosis is present. Pulmonic Valve: The pulmonic valve was normal in structure. Pulmonic valve regurgitation is not visualized. No evidence of pulmonic stenosis. Aorta: The aortic root is normal in size and structure. Venous: The inferior vena cava is normal in size with greater than 50% respiratory variability, suggesting right atrial pressure of 3 mmHg. IAS/Shunts: No atrial level shunt detected by color flow Doppler.  LEFT  VENTRICLE PLAX 2D LVIDd:         4.92 cm LVIDs:         3.37 cm LV PW:         1.68 cm LV IVS:        1.59 cm LVOT diam:     2.00 cm LVOT Area:     3.14 cm  LEFT ATRIUM         Index LA diam:    4.40 cm 2.20 cm/m  PULMONIC VALVE AORTA                 PV Vmax:        1.11 m/s Ao Root diam: 3.20 cm PV Peak grad:   4.9 mmHg                       RVOT Peak grad: 6 mmHg   SHUNTS Systemic Diam: 2.00 cm Isaias Cowman MD Electronically signed by Isaias Cowman MD Signature Date/Time: 11/25/2020/1:34:29 PM    Final      Labs:   Basic Metabolic Panel: Recent Labs  Lab 11/24/20 1643 11/24/20 1852 11/25/20 0452 11/26/20 0505  NA 133*  --  136 134*  K 5.1  --  4.3 4.3  CL 98  --  101 96*  CO2 29  --  29 28  GLUCOSE 163*  --  248* 248*  BUN 20  --  17 20  CREATININE 1.07*  --  0.95 1.01*  CALCIUM 8.7*  --  8.8* 8.7*  MG  --  1.7 2.1 1.9  PHOS  --   --  4.8*  --    GFR Estimated Creatinine Clearance: 53.9 mL/min (A) (by C-G formula based on SCr of 1.01 mg/dL (H)). Liver Function Tests: Recent Labs  Lab 11/25/20 0452  AST 32  ALT 17  ALKPHOS 82  BILITOT 0.5  PROT 6.7  ALBUMIN 3.2*   No results for input(s): LIPASE, AMYLASE in the last 168 hours. No results for input(s): AMMONIA in the last 168 hours. Coagulation profile No results for input(s): INR, PROTIME in the last 168 hours.  CBC: Recent Labs  Lab 11/24/20 1643 11/25/20 0452  WBC 8.8 7.7  HGB 9.6* 9.7*  HCT 29.7* 30.1*  MCV 86.6 87.2  PLT 189 153   Cardiac Enzymes: No results for input(s): CKTOTAL, CKMB, CKMBINDEX, TROPONINI in the last 168 hours. BNP: Invalid input(s): POCBNP CBG: Recent Labs  Lab 11/25/20 1147 11/25/20 1701 11/25/20 1710 11/25/20 2041 11/26/20 0808  GLUCAP 340* 310* 293* 350* 263*   D-Dimer No results for input(s): DDIMER in the last 72 hours. Hgb A1c No results for input(s): HGBA1C in the last 72 hours. Lipid Profile No results for input(s): CHOL, HDL,  LDLCALC, TRIG, CHOLHDL, LDLDIRECT in the last 72 hours. Thyroid function studies Recent Labs    11/24/20 1852  TSH 5.687*   Anemia work up No results for input(s): VITAMINB12, FOLATE, FERRITIN, TIBC, IRON, RETICCTPCT in the last 72 hours. Microbiology Recent Results (from the past 240 hour(s))  SARS CORONAVIRUS 2 (TAT 6-24 HRS) Nasopharyngeal Nasopharyngeal Swab     Status: None   Collection Time: 11/24/20  6:52 PM   Specimen: Nasopharyngeal Swab  Result Value Ref Range Status   SARS Coronavirus 2 NEGATIVE NEGATIVE Final    Comment: (NOTE) SARS-CoV-2 target nucleic acids are NOT DETECTED.  The SARS-CoV-2 RNA is generally detectable in upper and lower respiratory specimens during the acute phase of infection. Negative results do not preclude SARS-CoV-2 infection, do not rule out co-infections with other pathogens, and should not be used as the sole basis for treatment or other patient management decisions. Negative results must be combined with clinical observations, patient history, and epidemiological information. The expected result is Negative.  Fact Sheet for Patients: SugarRoll.be  Fact Sheet for Healthcare Providers: https://www.woods-mathews.com/  This test is not yet approved or cleared by the [place] FDA and  has been authorized for detection and/or diagnosis of SARS-CoV-2  by FDA under an Emergency Use Authorization (EUA). This EUA will remain  in effect (meaning this test can be used) for the duration of the COVID-19 declaration under Se ction 564(b)(1) of the Act, 21 U.S.C. section 360bbb-3(b)(1), unless the authorization is terminated or revoked sooner.  Performed at [facility], [place] [address] [place], [place] [address]      Discharge Instructions:   Discharge Instructions     AMB referral to CHF clinic   Complete by: As directed    Diet - low sodium heart healthy   Complete by: As directed     Diet Carb Modified   Complete by: As directed    Increase activity slowly   Complete by: As directed       Allergies as of 11/26/2020       Reactions   Amitriptyline Other (See Comments)   Unknown reaction   Benadryl [diphenhydramine] Shortness Of Breath   Demerol [meperidine] Other (See Comments)   Unknown reaction   Gabapentin Other (See Comments)   Unknown reaction   Meperidine Hcl Other (See Comments)   Unknown reaction   Mirtazapine Other (See Comments)   Unknown reaction   Olanzapine Other (See Comments)   Unknown reaction   Voltaren [diclofenac Sodium] Shortness Of Breath   Zetia [ezetimibe] Other (See Comments)   Weakness in legs, shakiness all over   Ativan [lorazepam] Other (See Comments)   Causes double vision at highter than .5 mg dose   Atorvastatin Other (See Comments)   Muscle aches and weakness   Budesonide-formoterol Fumarate Other (See Comments)   Shakiness, tremors   Bupropion Hcl Other (See Comments)   "cloud over me" depression   Caffeine Other (See Comments)   jitters   Codeine Sulfate Other (See Comments)   Makes chest hurt like a heart attack   Lisinopril Cough   Metformin Nausea And Vomiting   Mometasone Furoate Nausea And Vomiting   Morphine Sulfate Other (See Comments)   Chest pain like a heart attack   Other Other (See Comments)   Beta Blockers, reaction shortness of breath   Oxycodone-acetaminophen Nausea And Vomiting   Pioglitazone Other (See Comments)   Cannot take because of risk of bladder cancer   Propoxyphene N-acetaminophen Nausea And Vomiting   Rosuvastatin Other (See Comments)   Muscle aches and weakness   Shellfish Allergy Diarrhea   Suvorexant Other (See Comments)   Jerking/nervous    Ticagrelor    Other reaction(s): Other (See Comments) "slowed heart rate" & chest pain   Tramadol Nausea Only   Trazodone And Nefazodone Nausea And Vomiting   Venlafaxine Other (See Comments)   Unknown reaction   Zolpidem Tartrate Other  (See Comments)    Jittery, diarrhea   Latex Rash        Medication List     STOP taking these medications    nitroGLYCERIN 0.4 MG SL tablet Commonly known as: NITROSTAT       TAKE these medications    acetaminophen 500 MG tablet Commonly known as: TYLENOL Take 500-1,000 mg by mouth every 6 (six) hours as needed (pain).   albuterol (2.5 MG/3ML) 0.083% nebulizer solution Commonly known as: PROVENTIL Take 2.5 mg by nebulization every 4 (four) hours as needed for shortness of breath or wheezing.   Anoro Ellipta 62.5-25 MCG/INH Aepb Generic drug: umeclidinium-vilanterol Inhale 1 puff into the lungs daily.   aspirin 81 MG EC tablet Take 1 tablet (81 mg total) by mouth daily. Swallow whole.  CALTRATE 600 PLUS-VIT D PO Take 1 tablet by mouth 2 (two) times daily.   clopidogrel 75 MG tablet Commonly known as: PLAVIX Take 75 mg by mouth daily.   DULoxetine 20 MG capsule Commonly known as: CYMBALTA Take 1 capsule (20 mg total) by mouth in the morning.   FLUoxetine 40 MG capsule Commonly known as: PROZAC Take 40 mg by mouth 2 (two) times daily.   furosemide 20 MG tablet Commonly known as: Lasix Take 1 tablet (20 mg total) by mouth 2 (two) times daily. What changed: when to take this   glipiZIDE 10 MG 24 hr tablet Commonly known as: GLUCOTROL XL Take 10 mg by mouth in the morning.   insulin NPH-regular Human (70-30) 100 UNIT/ML injection Inject 60 Units into the skin 2 (two) times daily with a meal. Inject 60u under the skin every morning at breakfast and inject 60u under the skin every evening with supper   isosorbide mononitrate 30 MG 24 hr tablet Commonly known as: IMDUR Take 1 tablet (30 mg total) by mouth daily.   levothyroxine 125 MCG tablet Commonly known as: SYNTHROID Take 125 mcg by mouth daily before breakfast.   losartan 25 MG tablet Commonly known as: COZAAR Take 1 tablet (25 mg total) by mouth in the morning.   pantoprazole 40 MG  tablet Commonly known as: PROTONIX Take 40 mg by mouth in the morning.   potassium chloride 10 MEQ tablet Commonly known as: KLOR-CON Take 1 tablet (10 mEq total) by mouth daily. What changed:  when to take this reasons to take this   promethazine 25 MG tablet Commonly known as: PHENERGAN Take 25 mg by mouth every 6 (six) hours as needed for nausea or vomiting.   traZODone 100 MG tablet Commonly known as: DESYREL Take 200 mg by mouth at bedtime.          Time coordinating discharge: 31 minutes  Signed:  Laverda Stribling  [facility] 11/26/2020, 10:07 AM   Pager on [email or web address]. If 7PM-7AM, please contact night-coverage at [email or web address]

## 2020-11-26 NOTE — Progress Notes (Signed)
Discharge instructions explained to pt and pts husband/ verbalized an understanding/ iv and tele removed/ transported off unit via wheelchair.

## 2020-12-09 ENCOUNTER — Other Ambulatory Visit: Payer: Self-pay

## 2020-12-09 ENCOUNTER — Inpatient Hospital Stay: Payer: Medicare HMO | Attending: Oncology

## 2020-12-09 DIAGNOSIS — J80 Acute respiratory distress syndrome: Secondary | ICD-10-CM | POA: Insufficient documentation

## 2020-12-09 DIAGNOSIS — D631 Anemia in chronic kidney disease: Secondary | ICD-10-CM | POA: Insufficient documentation

## 2020-12-09 DIAGNOSIS — D500 Iron deficiency anemia secondary to blood loss (chronic): Secondary | ICD-10-CM

## 2020-12-09 DIAGNOSIS — N1831 Chronic kidney disease, stage 3a: Secondary | ICD-10-CM | POA: Insufficient documentation

## 2020-12-09 LAB — CBC WITH DIFFERENTIAL/PLATELET
Abs Immature Granulocytes: 0.06 10*3/uL (ref 0.00–0.07)
Basophils Absolute: 0.1 10*3/uL (ref 0.0–0.1)
Basophils Relative: 1 %
Eosinophils Absolute: 0.3 10*3/uL (ref 0.0–0.5)
Eosinophils Relative: 3 %
HCT: 30.2 % — ABNORMAL LOW (ref 36.0–46.0)
Hemoglobin: 9.7 g/dL — ABNORMAL LOW (ref 12.0–15.0)
Immature Granulocytes: 1 %
Lymphocytes Relative: 18 %
Lymphs Abs: 1.6 10*3/uL (ref 0.7–4.0)
MCH: 28 pg (ref 26.0–34.0)
MCHC: 32.1 g/dL (ref 30.0–36.0)
MCV: 87.3 fL (ref 80.0–100.0)
Monocytes Absolute: 0.5 10*3/uL (ref 0.1–1.0)
Monocytes Relative: 6 %
Neutro Abs: 6 10*3/uL (ref 1.7–7.7)
Neutrophils Relative %: 71 %
Platelets: 146 10*3/uL — ABNORMAL LOW (ref 150–400)
RBC: 3.46 MIL/uL — ABNORMAL LOW (ref 3.87–5.11)
RDW: 13.5 % (ref 11.5–15.5)
WBC: 8.5 10*3/uL (ref 4.0–10.5)
nRBC: 0 % (ref 0.0–0.2)

## 2020-12-09 LAB — FERRITIN: Ferritin: 206 ng/mL (ref 11–307)

## 2020-12-09 LAB — ECHO TEE
AR max vel: 1.28 cm2
AV Area VTI: 1.22 cm2
AV Area mean vel: 1.32 cm2
AV Mean grad: 32.5 mmHg
AV Peak grad: 55.5 mmHg
Ao pk vel: 3.73 m/s
MV M vel: 2.14 m/s
MV Peak grad: 18.3 mmHg
MV VTI: 1.24 cm2

## 2020-12-09 LAB — IRON AND TIBC
Iron: 40 ug/dL (ref 28–170)
Saturation Ratios: 13 % (ref 10.4–31.8)
TIBC: 298 ug/dL (ref 250–450)
UIBC: 258 ug/dL

## 2020-12-09 LAB — RETIC PANEL
Immature Retic Fract: 17.2 % — ABNORMAL HIGH (ref 2.3–15.9)
RBC.: 3.53 MIL/uL — ABNORMAL LOW (ref 3.87–5.11)
Retic Count, Absolute: 117.2 10*3/uL (ref 19.0–186.0)
Retic Ct Pct: 3.3 % — ABNORMAL HIGH (ref 0.4–3.1)
Reticulocyte Hemoglobin: 31.5 pg (ref >27.9)

## 2020-12-13 ENCOUNTER — Inpatient Hospital Stay: Payer: Medicare HMO

## 2020-12-13 ENCOUNTER — Inpatient Hospital Stay (HOSPITAL_BASED_OUTPATIENT_CLINIC_OR_DEPARTMENT_OTHER): Payer: Medicare HMO | Admitting: Oncology

## 2020-12-13 ENCOUNTER — Encounter: Payer: Self-pay | Admitting: Oncology

## 2020-12-13 ENCOUNTER — Other Ambulatory Visit: Payer: Self-pay

## 2020-12-13 VITALS — BP 146/73 | HR 82 | Resp 28

## 2020-12-13 VITALS — BP 129/72 | HR 85 | Resp 19

## 2020-12-13 DIAGNOSIS — D631 Anemia in chronic kidney disease: Secondary | ICD-10-CM | POA: Diagnosis not present

## 2020-12-13 DIAGNOSIS — N1831 Chronic kidney disease, stage 3a: Secondary | ICD-10-CM | POA: Diagnosis not present

## 2020-12-13 DIAGNOSIS — D500 Iron deficiency anemia secondary to blood loss (chronic): Secondary | ICD-10-CM | POA: Diagnosis not present

## 2020-12-13 DIAGNOSIS — R0602 Shortness of breath: Secondary | ICD-10-CM | POA: Diagnosis not present

## 2020-12-13 MED ORDER — SODIUM CHLORIDE 0.9 % IV SOLN
INTRAVENOUS | Status: DC
  Filled 2020-12-13: qty 250

## 2020-12-13 MED ORDER — IRON SUCROSE 20 MG/ML IV SOLN
200.0000 mg | Freq: Once | INTRAVENOUS | Status: AC
  Administered 2020-12-13: 200 mg via INTRAVENOUS
  Filled 2020-12-13: qty 10

## 2020-12-13 NOTE — Progress Notes (Signed)
Hematology/Oncology follow up note [facility] [phone] [phone]   Patient Care Team: Rusty Aus, MD as PCP - General (Internal Medicine) [name], MD as PCP - Cardiology (Cardiology) [name], MD as Consulting Physician (Cardiology) Gabriel Carina Betsey Holiday, MD as Physician Assistant (Internal Medicine) [name], MD as Consulting Physician (Cardiothoracic Surgery) Earlie Server, MD as Consulting Physician (Hematology and Oncology)  REFERRING PROVIDER: Rusty Aus, MD CHIEF COMPLAINTS/REASON FOR VISIT:  Follow up  of iron deficiency anemia  HISTORY OF PRESENTING ILLNESS:  [name] is a  75 y.o.  female with PMH listed below was seen in consultation at the request of Rusty Aus, MD   for evaluation of iron deficiency anemia.   Reviewed patient's recent labs  01/17/2019 labs revealed anemia with hemoglobin of 8.5, MCV 75.3. Reviewed patient's previous labs ordered by primary care physician's office, anemia is chronic onset , duration is since 2009.  Patient presented to emergency room on 01/17/2019 for evaluation of fatigue, patient is currently on a 10-day course of doxycycline prescribed by emergency room. She was also recently admitted between 01/07/2019 - 01/08/2019 due to parotitis submandibular gland inflammation.  Received a Decadron and Unasyn.  During that admission, patient received IV Venofer treatments.   Associated signs and symptoms: Patient reports fatigue.  Associated with SOB with exertion.  Denies weight loss, easy bruising, hematochezia, hemoptysis, hematuria. Context:  History of iron deficiency: History of iron deficiency. Rectal bleeding: Denies any blood in the stool. Patient had a history of GI bleeding. Last endoscopy: 11/08/2012 colonoscopy a single solitary 12 mm ulcer was found in the descending colon.  A lot of blood suctioned from colon.  Some bleeding was present.  Stigmata of  recent bleeding was present.  Area was successfully injected with 6 mL of a 1-10,000 solution of epinephrine for drug delivery.  1 large hemostatic clip was successfully placed.  Single large mouth diverticulum was found in the sigmoid colon.  Area was successfully injected with 3 mils of 1-10,000 solution of epinephrine.  Benign polyps were removed. 01/28/2013 EGD by Dr. Jamal Collin showed erythematous mucosa in the antrum.  Esophageal.  Mucosal changes suspicious for Barrett esophagus.  Biopsy showed antral mucosa with moderate reactive gastropathy.  No intestinal metaplasia, dysplasia or malignancy.  No H. pylori.  GE junction biopsy showed stratified squamous epithelium without inflammation or reactive changes.  And a single tiny focus of columnar epithelium most compatible with an esophageal clot.  No interstitial metaplasia or dysplasia or malignancy.    INTERVAL HISTORY [name] is a 75 y.o. female who has above history reviewed by me today presents for follow up visit for management of iron deficiency anemia.  Problems and complaints are listed below: Patient has had frequent hospitalization due to CHF exacerbation Patient feels shortness of breath today.  Patient was accompanied by her husband. She appears in mild distress due to shortness of breath.  Pulse ox was 98 on room air.  Patient reports that she uses home oxygen but does not qualify for portable oxygen tank.    Review of Systems  Constitutional:  Positive for fatigue. Negative for appetite change, chills and fever.  HENT:   Positive for hearing loss. Negative for voice change.   Eyes:  Negative for eye problems.  Respiratory:  Positive for shortness of breath. Negative for chest tightness and cough.   Cardiovascular:  Negative for chest pain.  Gastrointestinal:  Negative for abdominal distention,  abdominal pain and blood in stool.  Endocrine: Negative for hot flashes.  Genitourinary:  Negative for difficulty urinating and  frequency.   Musculoskeletal:  Negative for arthralgias.  Skin:  Negative for itching and rash.  Neurological:  Negative for extremity weakness.  Hematological:  Negative for adenopathy.  Psychiatric/Behavioral:  Negative for confusion.    MEDICAL HISTORY:  Past Medical History:  Diagnosis Date   1st degree AV block    ACE-inhibitor cough    Allergic rhinitis    Anemia    iron deficiency anemia   Anxiety    Aortic ectasia (HCC)    a. CT abd in 12/2016 incidentally noted aortic atherosclerosis and infrarenal abdominal aortic ectasia measuring as large as 2.7 cm with recommendation to repeat US in 2023.   Arthritis    Asthma    Cataract    Chronic depression    Chronic diastolic CHF (congestive heart failure) (HCC)    Chronic headache    COPD (chronic obstructive pulmonary disease) (HCC)    Coronary artery disease    a. DES to RCA and mid Cx 2009. b. CABG and bioprosthetic AVR May 2015. c. cutting balloon to prox Cx in 05/2016   Diabetes mellitus    type 2   Diverticulitis of colon    Essential hypertension    GERD (gastroesophageal reflux disease)    Hearing loss    History of blood transfusion 2013   History of prosthetic aortic valve replacement    HOH (hard of hearing)    Hypercholesterolemia    intolerance of statins and niaspan   IDA (iron deficiency anemia) 02/03/2019   Mobitz type 1 second degree AV block    OSA (obstructive sleep apnea)    mild, intolerant of cpap   PAD (peripheral artery disease) ([place])    a. atherosclerosis by CT abd 12/2016 in [place].   PONV (postoperative nausea and vomiting)    Statin intolerance    Thyroid disease     SURGICAL HISTORY: Past Surgical History:  Procedure Laterality Date   ABDOMINAL HYSTERECTOMY     ABDOMINAL HYSTERECTOMY W/ PARTIAL VAGINACTOMY     AORTIC VALVE REPLACEMENT N/A 10/12/2013   Procedure: AORTIC VALVE REPLACEMENT (AVR);  Surgeon: [name], MD;  Location: [place];  Service: Open Heart Surgery;  Laterality: N/A;    APPENDECTOMY  1964   BARTHOLIN GLAND CYST EXCISION     BLADDER SUSPENSION     BREAST BIOPSY Bilateral 09/11/2000   neg   BREAST BIOPSY Left 07/24/2010   neg   BREAST CYST EXCISION  1988   bilateral nonmalignant tumors, x3   CARDIAC CATHETERIZATION     CARDIAC CATHETERIZATION N/A 05/25/2016   Procedure: Coronary Balloon Angioplasty;  Surgeon: Leonie Man, MD;  Location: [place] [facility];  Service: Cardiovascular;  Laterality: N/A;   CARDIAC CATHETERIZATION N/A 05/25/2016   Procedure: Coronary/Graft Angiography;  Surgeon: Leonie Man, MD;  Location: [place] [facility];  Service: Cardiovascular;  Laterality: N/A;   CATARACT EXTRACTION W/ INTRAOCULAR LENS  IMPLANT, BILATERAL     CHOLECYSTECTOMY  2001   COLECTOMY     lap sigmoid   COLONOSCOPY  2014   polyps found, 2 clamped off.   CORONARY ANGIOPLASTY  10/29/2007   Prox RCA & Mid Cx.   CORONARY ARTERY BYPASS GRAFT N/A 10/12/2013   Procedure: CORONARY ARTERY BYPASS GRAFT TIMES TWO;  Surgeon: [name], MD;  Location: [place] [facility];  Service: Open Heart Surgery;  Laterality: N/A;  CORONARY/GRAFT ANGIOGRAPHY N/A 09/20/2017   Procedure: CORONARY/GRAFT ANGIOGRAPHY;  Surgeon: Sherren Mocha, MD;  Location: [place] [facility];  Service: Cardiovascular;  Laterality: N/A;   LEFT HEART CATHETERIZATION WITH CORONARY ANGIOGRAM N/A 10/09/2013   Procedure: LEFT HEART CATHETERIZATION WITH CORONARY ANGIOGRAM;  Surgeon: [name], MD;  Location[facility];  Service: Cardiovascular;  Laterality: N/A;   RIGHT/LEFT HEART CATH AND CORONARY ANGIOGRAPHY N/A 11/03/2020   Procedure: RIGHT/LEFT HEART CATH AND CORONARY ANGIOGRAPHY;  Surgeon: Corey Skains, MD;  Location: [place] [facility];  Service: Cardiovascular;  Laterality: N/A;   STERNAL WIRES REMOVAL N/A 04/13/2014   Procedure: STERNAL WIRES REMOVAL;  Surgeon: [name], MD;  Location: [place];  Service: Thoracic;  Laterality: N/A;   TEE WITHOUT CARDIOVERSION N/A 11/03/2020   Procedure:  TRANSESOPHAGEAL ECHOCARDIOGRAM (TEE);  Surgeon: Corey Skains, MD;  Location: [facility];  Service: Cardiovascular;  Laterality: N/A;   THYROIDECTOMY     TONSILLECTOMY     TUBAL LIGATION     VAGINAL DELIVERY     3   VISCERAL ARTERY INTERVENTION N/A 08/16/2016   Procedure: Visceral Artery Intervention;  Surgeon: Algernon Huxley, MD;  Location: [place] [facility];  Service: Cardiovascular;  Laterality: N/A;    SOCIAL HISTORY: Social History   Socioeconomic History   Marital status: Married    Spouse name: Not on file   Number of children: 3   Years of education: Not on file   Highest education level: Not on file  Occupational History   Occupation: Retired    Fish farm manager: UNEMPLOYED    Comment: CNA  Tobacco Use   Smoking status: Former    Packs/day: 0.50    Years: 30.00    Pack years: 15.00    Types: Cigarettes    Quit date: 10/02/2013    Years since quitting: 7.2   Smokeless tobacco: Never  Vaping Use   Vaping Use: Never used  Substance and Sexual Activity   Alcohol use: No   Drug use: No   Sexual activity: Not Currently  Other Topics Concern   Not on file  Social History Narrative   Does not have Living Will   Desires CPR, would not want prolonged life support if futile.   Social Determinants of Health   Financial Resource Strain: Not on file  Food Insecurity: Not on file  Transportation Needs: Not on file  Physical Activity: Not on file  Stress: Not on file  Social Connections: Not on file  Intimate Partner Violence: Not on file    FAMILY HISTORY: Family History  Problem Relation Age of Onset   Breast cancer Mother 12   Hypertension Father    Mesothelioma Father    Asthma Father    Stroke Paternal Grandfather    Heart disease Other    Breast cancer Maternal Aunt    Breast cancer Paternal Aunt     ALLERGIES:  is allergic to amitriptyline, benadryl [diphenhydramine], demerol [meperidine], gabapentin, meperidine hcl, mirtazapine, olanzapine, voltaren  [diclofenac sodium], zetia [ezetimibe], ativan [lorazepam], atorvastatin, budesonide-formoterol fumarate, bupropion hcl, caffeine, codeine sulfate, lisinopril, metformin, mometasone furoate, morphine sulfate, other, oxycodone-acetaminophen, pioglitazone, propoxyphene n-acetaminophen, rosuvastatin, shellfish allergy, suvorexant, ticagrelor, tramadol, trazodone and nefazodone, venlafaxine, zolpidem tartrate, and latex.  MEDICATIONS:  Current Outpatient Medications  Medication Sig Dispense Refill   acetaminophen (TYLENOL) 500 MG tablet Take 500-1,000 mg by mouth every 6 (six) hours as needed (pain).     albuterol (PROVENTIL) (2.5 MG/3ML) 0.083% nebulizer solution Take 2.5 mg by  nebulization every 4 (four) hours as needed for shortness of breath or wheezing.     ANORO ELLIPTA 62.5-25 MCG/INH AEPB Inhale 1 puff into the lungs daily.     aspirin EC 81 MG EC tablet Take 1 tablet (81 mg total) by mouth daily. Swallow whole. 30 tablet 11   Calcium-Vitamin D (CALTRATE 600 PLUS-VIT D PO) Take 1 tablet by mouth 2 (two) times daily.     clopidogrel (PLAVIX) 75 MG tablet Take 75 mg by mouth daily.     DULoxetine (CYMBALTA) 20 MG capsule Take 1 capsule (20 mg total) by mouth in the morning.     FLUoxetine (PROZAC) 40 MG capsule Take 40 mg by mouth 2 (two) times daily.     furosemide (LASIX) 20 MG tablet Take 1 tablet (20 mg total) by mouth 2 (two) times daily. 60 tablet 0   glipiZIDE (GLUCOTROL XL) 10 MG 24 hr tablet Take 10 mg by mouth in the morning.     insulin NPH-regular Human (NOVOLIN 70/30) (70-30) 100 UNIT/ML injection Inject 60 Units into the skin 2 (two) times daily with a meal. Inject 60u under the skin every morning at breakfast and inject 60u under the skin every evening with supper     isosorbide mononitrate (IMDUR) 30 MG 24 hr tablet Take 1 tablet (30 mg total) by mouth daily. 30 tablet 11   levothyroxine (SYNTHROID, LEVOTHROID) 125 MCG tablet Take 125 mcg by mouth daily before breakfast.      losartan (COZAAR) 25 MG tablet Take 1 tablet (25 mg total) by mouth in the morning.     nystatin (MYCOSTATIN) 100000 UNIT/ML suspension Take by mouth.     pantoprazole (PROTONIX) 40 MG tablet Take 40 mg by mouth in the morning.     potassium chloride (KLOR-CON) 10 MEQ tablet Take 1 tablet (10 mEq total) by mouth daily. 30 tablet 0   promethazine (PHENERGAN) 25 MG tablet Take 25 mg by mouth every 6 (six) hours as needed for nausea or vomiting.     torsemide (DEMADEX) 20 MG tablet Take 20 mg by mouth daily.     traZODone (DESYREL) 100 MG tablet Take 200 mg by mouth at bedtime.     No current facility-administered medications for this visit.     PHYSICAL EXAMINATION: ECOG PERFORMANCE STATUS: 1 - Symptomatic but completely ambulatory Vitals:   12/13/20 1324  BP: 129/72  Pulse: 85  Resp: 19  SpO2: 98%   Filed Weights   Physical Exam Constitutional:      General: She is in acute distress.     Appearance: She is obese.  HENT:     Head: Normocephalic and atraumatic.  Eyes:     General: No scleral icterus.    Pupils: Pupils are equal, round, and reactive to light.  Cardiovascular:     Rate and Rhythm: Normal rate and regular rhythm.     Heart sounds: Murmur heard.  Pulmonary:     Effort: Pulmonary effort is normal. No respiratory distress.     Breath sounds: No wheezing.     Comments: Decreased breath sound bilaterally Abdominal:     General: Bowel sounds are normal. There is no distension.     Palpations: Abdomen is soft. There is no mass.     Tenderness: There is no abdominal tenderness.  Musculoskeletal:        General: No deformity. Normal range of motion.     Cervical back: Normal range of motion and neck supple.  Comments: Bilateral lower extremity trace edema  Skin:    General: Skin is warm and dry.     Coloration: Skin is not pale.     Findings: No erythema or rash.  Neurological:     Mental Status: She is alert and oriented to person, place, and time. Mental  status is at baseline.     Cranial Nerves: No cranial nerve deficit.     Coordination: Coordination normal.  Psychiatric:     Comments: Anxious      CMP Latest Ref Rng & Units 11/26/2020  Glucose 70 - 99 mg/dL 248(H)  BUN 8 - 23 mg/dL 20  Creatinine 0.44 - 1.00 mg/dL 1.01(H)  Sodium 135 - 145 mmol/L 134(L)  Potassium 3.5 - 5.1 mmol/L 4.3  Chloride 98 - 111 mmol/L 96(L)  CO2 22 - 32 mmol/L 28  Calcium 8.9 - 10.3 mg/dL 8.7(L)  Total Protein 6.5 - 8.1 g/dL -  Total Bilirubin 0.3 - 1.2 mg/dL -  Alkaline Phos 38 - 126 U/L -  AST 15 - 41 U/L -  ALT 0 - 44 U/L -   CBC Latest Ref Rng & Units 12/09/2020  WBC 4.0 - 10.5 K/uL 8.5  Hemoglobin 12.0 - 15.0 g/dL 9.7(L)  Hematocrit 36.0 - 46.0 % 30.2(L)  Platelets 150 - 400 K/uL 146(L)     LABORATORY DATA:  I have reviewed the data as listed Lab Results  Component Value Date   WBC 8.5 12/09/2020   HGB 9.7 (L) 12/09/2020   HCT 30.2 (L) 12/09/2020   MCV 87.3 12/09/2020   PLT 146 (L) 12/09/2020   Recent Labs    06/15/20 0957 08/08/20 0720 11/24/20 1643 11/25/20 0452 11/26/20 0505  NA 136   < > 133* 136 134*  K 3.6   < > 5.1 4.3 4.3  CL 100   < > 98 101 96*  CO2 24   < > 29 29 28   GLUCOSE 227*   < > 163* 248* 248*  BUN 14   < > 20 17 20   CREATININE 1.02*   < > 1.07* 0.95 1.01*  CALCIUM 8.6*   < > 8.7* 8.8* 8.7*  GFRNONAA 57*   < > 54* >60 58*  PROT 6.7  --   --  6.7  --   ALBUMIN 2.9*  --   --  3.2*  --   AST 18  --   --  32  --   ALT 15  --   --  17  --   ALKPHOS 94  --   --  82  --   BILITOT 0.5  --   --  0.5  --    < > = values in this interval not displayed.    Iron/TIBC/Ferritin/ %Sat    Component Value Date/Time   IRON 40 12/09/2020 1059   IRON 41 (L) 03/31/2014 1417   TIBC 298 12/09/2020 1059   TIBC 264 03/31/2014 1417   FERRITIN 206 12/09/2020 1059   FERRITIN 406 (H) 03/31/2014 1417   IRONPCTSAT 13 12/09/2020 1059   IRONPCTSAT 16 03/31/2014 1417     DG Chest 2 View  Result Date: 11/24/2020 CLINICAL  DATA:  Chest pain and shortness of breath. EXAM: CHEST - 2 VIEW COMPARISON:  11/05/2020 FINDINGS: Post median sternotomy with aortic valve replacement. Stable cardiomegaly. Unchanged mediastinal contours. Chronic interstitial prominence likely reflecting pulmonary edema, with interval improvement from most recent imaging. No focal airspace disease or pleural effusion. No pneumothorax. Stable osseous  structures. IMPRESSION: Cardiomegaly. Mild pulmonary edema, improved from most recent imaging. Electronically Signed   By: Keith Rake M.D.   On: 11/24/2020 17:38   ECHOCARDIOGRAM COMPLETE  Result Date: 11/25/2020    ECHOCARDIOGRAM REPORT   Patient Name:   [name] Date of Exam: 11/25/2020 Medical Rec #:  [id number]     Height:       66.0 in Accession #:    [id number]    Weight:       200.0 lb Date of Birth:  03-27-1946      BSA:          2.000 m Patient Age:    68 years      BP:           139/64 mmHg Patient Gender: F             HR:           75 bpm. Exam Location:  [facility] Procedure: 2D Echo, Cardiac Doppler and Color Doppler Indications:     CHF-acute diastolic V67.20  History:         Patient has prior history of Echocardiogram examinations, most                  recent 11/03/2020. COPD; Risk Factors:Diabetes. AVR in 2015.  Sonographer:     Sherrie Sport RDCS (AE) Referring Phys:  [id number] Rhetta Mura Diagnosing Phys: Isaias Cowman MD  Sonographer Comments: No apical window and no subcostal window. Image acquisition challenging due to COPD. IMPRESSIONS  1. Left ventricular ejection fraction, by estimation, is 60 to 65%. The left ventricle has normal function. The left ventricle has no regional wall motion abnormalities. Left ventricular diastolic parameters are indeterminate.  2. Right ventricular systolic function is normal. The right ventricular size is normal.  3. The mitral valve is normal in structure. Mild mitral valve regurgitation. No evidence of mitral stenosis.  4. The aortic valve is normal  in structure. Aortic valve regurgitation is not visualized. No aortic stenosis is present.  5. The inferior vena cava is normal in size with greater than 50% respiratory variability, suggesting right atrial pressure of 3 mmHg. FINDINGS  Left Ventricle: Left ventricular ejection fraction, by estimation, is 60 to 65%. The left ventricle has normal function. The left ventricle has no regional wall motion abnormalities. The left ventricular internal cavity size was normal in size. There is  no left ventricular hypertrophy. Left ventricular diastolic parameters are indeterminate. Right Ventricle: The right ventricular size is normal. No increase in right ventricular wall thickness. Right ventricular systolic function is normal. Left Atrium: Left atrial size was normal in size. Right Atrium: Right atrial size was normal in size. Pericardium: There is no evidence of pericardial effusion. Mitral Valve: The mitral valve is normal in structure. Mild mitral valve regurgitation. No evidence of mitral valve stenosis. Tricuspid Valve: The tricuspid valve is normal in structure. Tricuspid valve regurgitation is mild . No evidence of tricuspid stenosis. Aortic Valve: The aortic valve is normal in structure. Aortic valve regurgitation is not visualized. No aortic stenosis is present. Pulmonic Valve: The pulmonic valve was normal in structure. Pulmonic valve regurgitation is not visualized. No evidence of pulmonic stenosis. Aorta: The aortic root is normal in size and structure. Venous: The inferior vena cava is normal in size with greater than 50% respiratory variability, suggesting right atrial pressure of 3 mmHg. IAS/Shunts: No atrial level shunt detected by color flow Doppler.  LEFT VENTRICLE PLAX 2D LVIDd:  4.92 cm LVIDs:         3.37 cm LV PW:         1.68 cm LV IVS:        1.59 cm LVOT diam:     2.00 cm LVOT Area:     3.14 cm  LEFT ATRIUM         Index LA diam:    4.40 cm 2.20 cm/m                        PULMONIC  VALVE AORTA                 PV Vmax:        1.11 m/s Ao Root diam: 3.20 cm PV Peak grad:   4.9 mmHg                       RVOT Peak grad: 6 mmHg   SHUNTS Systemic Diam: 2.00 cm Isaias Cowman MD Electronically signed by Isaias Cowman MD Signature Date/Time: 11/25/2020/1:34:29 PM    Final        ASSESSMENT & PLAN:  1. Iron deficiency anemia due to chronic blood loss   2. Anemia due to stage 3a chronic kidney disease ([place])   3. Shortness of breath    #Iron deficiency anemia/anemia of chronic disease  Labs reviewed and discussed with patient. Hemoglobin has dropped to 9.7, Iron panel showed ferritin of 206, iron saturation 13 In the context of chronic kidney impairment, I recommend patient proceed with IV Venofer 200 mg x 1 today and repeat another dose in 2 weeks.  #Dyspnea, mild respiratory distress Patient reports that she has been breathing this way for the past few months.  She has had multiple recent hospitalization due to acute CHF.  Patient was recommended to go to emergency room for further evaluation as per my clinical assessment, she is in mild respiratory distress.  Patient is not interested.  Patient was started on nasal cannula oxygen and slightly feels better.  She prefers to proceed with IV Venofer treatments to go home.  Recommend patient to keep an eye on her symptoms and go to emergency room if shortness of breath is worse.  She agrees.   Orders Placed This Encounter  Procedures   Retic Panel    Standing Status:   Future    Standing Expiration Date:   12/13/2021    All questions were answered. The patient knows to call the clinic with any problems questions or concerns.  Return of visit:  Labs in 3 months (cbc,iron,ferr)/ NP & venofer 1-2 days after labs  Labs in 6 months (cbc,iron, fer) / MD & venofer 1-2 days after labs  Earlie Server, MD, PhD Hematology [place] [facility] Pager- [phone] 12/13/2020

## 2020-12-13 NOTE — Patient Instructions (Signed)
[facility]  Discharge Instructions: Thank you for choosing [facility] to provide your oncology and hematology care.  If you have a lab appointment with the [facility], please go directly to the [facility] and check in at the registration area.  Wear comfortable clothing and clothing appropriate for easy access to any Portacath or PICC line.   We strive to give you quality time with your provider. You may need to reschedule your appointment if you arrive late (15 or more minutes).  Arriving late affects you and other patients whose appointments are after yours.  Also, if you miss three or more appointments without notifying the office, you may be dismissed from the clinic at the provider's discretion.      For prescription refill requests, have your pharmacy contact our office and allow 72 hours for refills to be completed.    Today you received the following medication: Venofer   To help prevent nausea and vomiting after your treatment, we encourage you to take your nausea medication as directed.  BELOW ARE SYMPTOMS THAT SHOULD BE REPORTED IMMEDIATELY: *FEVER GREATER THAN 100.4 F (38 C) OR HIGHER *CHILLS OR SWEATING *NAUSEA AND VOMITING THAT IS NOT CONTROLLED WITH YOUR NAUSEA MEDICATION *UNUSUAL SHORTNESS OF BREATH *UNUSUAL BRUISING OR BLEEDING *URINARY PROBLEMS (pain or burning when urinating, or frequent urination) *BOWEL PROBLEMS (unusual diarrhea, constipation, pain near the anus) TENDERNESS IN MOUTH AND THROAT WITH OR WITHOUT PRESENCE OF ULCERS (sore throat, sores in mouth, or a toothache) UNUSUAL RASH, SWELLING OR PAIN  UNUSUAL VAGINAL DISCHARGE OR ITCHING   Items with * indicate a potential emergency and should be followed up as soon as possible or go to the Emergency Department if any problems should occur.  Please show the CHEMOTHERAPY ALERT CARD or IMMUNOTHERAPY ALERT CARD at check-in to the Emergency Department and  triage nurse.  Should you have questions after your visit or need to cancel or reschedule your appointment, please contact [facility]  [phone] and follow the prompts.  Office hours are 8:00 a.m. to 4:30 p.m. Monday - Friday. Please note that voicemails left after 4:00 p.m. may not be returned until the following business day.  We are closed weekends and major holidays. You have access to a nurse at all times for urgent questions. Please call the main number to the clinic [phone] and follow the prompts.  For any non-urgent questions, you may also contact your provider using MyChart. We now offer e-Visits for anyone 11 and older to request care online for non-urgent symptoms. For details visit [email or web address].   Also download the MyChart app! Go to the app store, search "MyChart", open the app, select [facility], and log in with your MyChart username and password.  Due to Covid, a mask is required upon entering the hospital/clinic. If you do not have a mask, one will be given to you upon arrival. For doctor visits, patients may have 1 support person aged 66 or older with them. For treatment visits, patients cannot have anyone with them due to current Covid guidelines and our immunocompromised population. Iron Sucrose injection What is this medication? IRON SUCROSE (AHY ern SOO krohs) is an iron complex. Iron is used to make healthy red blood cells, which carry oxygen and nutrients throughout the body. This medicine is used to treat iron deficiency anemia in people with chronickidney disease. This medicine may be used for other purposes; ask your health care provider orpharmacist if  you have questions. COMMON BRAND NAME(S): Venofer What should I tell my care team before I take this medication? They need to know if you have any of these conditions: anemia not caused by low iron levels heart disease high levels of iron in the blood kidney  disease liver disease an unusual or allergic reaction to iron, other medicines, foods, dyes, or preservatives pregnant or trying to get pregnant breast-feeding How should I use this medication? This medicine is for infusion into a vein. It is given by a health careprofessional in a hospital or clinic setting. Talk to your pediatrician regarding the use of this medicine in children. While this drug may be prescribed for children as young as 2 years for selectedconditions, precautions do apply. Overdosage: If you think you have taken too much of this medicine contact apoison control center or emergency room at once. NOTE: This medicine is only for you. Do not share this medicine with others. What if I miss a dose? It is important not to miss your dose. Call your doctor or health careprofessional if you are unable to keep an appointment. What may interact with this medication? Do not take this medicine with any of the following medications: deferoxamine dimercaprol other iron products This medicine may also interact with the following medications: chloramphenicol deferasirox This list may not describe all possible interactions. Give your health care provider a list of all the medicines, herbs, non-prescription drugs, or dietary supplements you use. Also tell them if you smoke, drink alcohol, or use illegaldrugs. Some items may interact with your medicine. What should I watch for while using this medication? Visit your doctor or healthcare professional regularly. Tell your doctor or healthcare professional if your symptoms do not start to get better or if theyget worse. You may need blood work done while you are taking this medicine. You may need to follow a special diet. Talk to your doctor. Foods that contain iron include: whole grains/cereals, dried fruits, beans, or peas, leafy greenvegetables, and organ meats (liver, kidney). What side effects may I notice from receiving this  medication? Side effects that you should report to your doctor or health care professionalas soon as possible: allergic reactions like skin rash, itching or hives, swelling of the face, lips, or tongue breathing problems changes in blood pressure cough fast, irregular heartbeat feeling faint or lightheaded, falls fever or chills flushing, sweating, or hot feelings joint or muscle aches/pains seizures swelling of the ankles or feet unusually weak or tired Side effects that usually do not require medical attention (report to yourdoctor or health care professional if they continue or are bothersome): diarrhea feeling achy headache irritation at site where injected nausea, vomiting stomach upset tiredness This list may not describe all possible side effects. Call your doctor for medical advice about side effects. You may report side effects to FDA at1-800-FDA-1088. Where should I keep my medication? This drug is given in a hospital or clinic and will not be stored at home. NOTE: This sheet is a summary. It may not cover all possible information. If you have questions about this medicine, talk to your doctor, pharmacist, orhealth care provider.  2022 [employer] Standard (2011-03-01 17:14:35)

## 2020-12-13 NOTE — Progress Notes (Signed)
Patient is SOB and nauseous

## 2020-12-27 ENCOUNTER — Other Ambulatory Visit: Payer: Self-pay

## 2020-12-27 ENCOUNTER — Inpatient Hospital Stay: Payer: Medicare HMO

## 2020-12-27 VITALS — BP 105/47 | HR 76 | Temp 98.4°F | Resp 18

## 2020-12-27 DIAGNOSIS — D500 Iron deficiency anemia secondary to blood loss (chronic): Secondary | ICD-10-CM

## 2020-12-27 DIAGNOSIS — N1831 Chronic kidney disease, stage 3a: Secondary | ICD-10-CM | POA: Diagnosis not present

## 2020-12-27 MED ORDER — IRON SUCROSE 20 MG/ML IV SOLN
200.0000 mg | Freq: Once | INTRAVENOUS | Status: AC
  Administered 2020-12-27: 200 mg via INTRAVENOUS
  Filled 2020-12-27: qty 10

## 2020-12-27 MED ORDER — SODIUM CHLORIDE 0.9 % IV SOLN
Freq: Once | INTRAVENOUS | Status: AC
  Filled 2020-12-27: qty 250

## 2020-12-27 NOTE — Patient Instructions (Signed)

## 2021-01-04 ENCOUNTER — Ambulatory Visit: Payer: Medicare HMO | Admitting: Family

## 2021-01-28 ENCOUNTER — Emergency Department: Payer: Medicare HMO

## 2021-01-28 ENCOUNTER — Other Ambulatory Visit: Payer: Self-pay

## 2021-01-28 ENCOUNTER — Inpatient Hospital Stay
Admission: EM | Admit: 2021-01-28 | Discharge: 2021-01-31 | DRG: 190 | Disposition: A | Discharge status: Home Health | Payer: Medicare HMO | Attending: Internal Medicine | Admitting: Internal Medicine

## 2021-01-28 DIAGNOSIS — I10 Essential (primary) hypertension: Secondary | ICD-10-CM | POA: Diagnosis present

## 2021-01-28 DIAGNOSIS — D508 Other iron deficiency anemias: Secondary | ICD-10-CM

## 2021-01-28 DIAGNOSIS — J810 Acute pulmonary edema: Secondary | ICD-10-CM | POA: Diagnosis not present

## 2021-01-28 DIAGNOSIS — D509 Iron deficiency anemia, unspecified: Secondary | ICD-10-CM | POA: Diagnosis present

## 2021-01-28 DIAGNOSIS — Z87891 Personal history of nicotine dependence: Secondary | ICD-10-CM

## 2021-01-28 DIAGNOSIS — D649 Anemia, unspecified: Secondary | ICD-10-CM | POA: Diagnosis present

## 2021-01-28 DIAGNOSIS — Z794 Long term (current) use of insulin: Secondary | ICD-10-CM

## 2021-01-28 DIAGNOSIS — F325 Major depressive disorder, single episode, in full remission: Secondary | ICD-10-CM | POA: Diagnosis present

## 2021-01-28 DIAGNOSIS — E871 Hypo-osmolality and hyponatremia: Secondary | ICD-10-CM | POA: Diagnosis present

## 2021-01-28 DIAGNOSIS — I272 Pulmonary hypertension, unspecified: Secondary | ICD-10-CM | POA: Diagnosis present

## 2021-01-28 DIAGNOSIS — T82855A Stenosis of coronary artery stent, initial encounter: Secondary | ICD-10-CM | POA: Diagnosis present

## 2021-01-28 DIAGNOSIS — Z9049 Acquired absence of other specified parts of digestive tract: Secondary | ICD-10-CM

## 2021-01-28 DIAGNOSIS — R0602 Shortness of breath: Secondary | ICD-10-CM

## 2021-01-28 DIAGNOSIS — F418 Other specified anxiety disorders: Secondary | ICD-10-CM | POA: Diagnosis not present

## 2021-01-28 DIAGNOSIS — E1165 Type 2 diabetes mellitus with hyperglycemia: Secondary | ICD-10-CM | POA: Diagnosis present

## 2021-01-28 DIAGNOSIS — I2510 Atherosclerotic heart disease of native coronary artery without angina pectoris: Secondary | ICD-10-CM | POA: Diagnosis present

## 2021-01-28 DIAGNOSIS — E1151 Type 2 diabetes mellitus with diabetic peripheral angiopathy without gangrene: Secondary | ICD-10-CM | POA: Diagnosis present

## 2021-01-28 DIAGNOSIS — Z951 Presence of aortocoronary bypass graft: Secondary | ICD-10-CM

## 2021-01-28 DIAGNOSIS — E7800 Pure hypercholesterolemia, unspecified: Secondary | ICD-10-CM | POA: Diagnosis present

## 2021-01-28 DIAGNOSIS — Z79899 Other long term (current) drug therapy: Secondary | ICD-10-CM

## 2021-01-28 DIAGNOSIS — Z90710 Acquired absence of both cervix and uterus: Secondary | ICD-10-CM

## 2021-01-28 DIAGNOSIS — E890 Postprocedural hypothyroidism: Secondary | ICD-10-CM | POA: Diagnosis present

## 2021-01-28 DIAGNOSIS — I509 Heart failure, unspecified: Secondary | ICD-10-CM

## 2021-01-28 DIAGNOSIS — E1169 Type 2 diabetes mellitus with other specified complication: Secondary | ICD-10-CM

## 2021-01-28 DIAGNOSIS — J449 Chronic obstructive pulmonary disease, unspecified: Secondary | ICD-10-CM | POA: Diagnosis not present

## 2021-01-28 DIAGNOSIS — I110 Hypertensive heart disease with heart failure: Secondary | ICD-10-CM | POA: Diagnosis present

## 2021-01-28 DIAGNOSIS — R531 Weakness: Secondary | ICD-10-CM

## 2021-01-28 DIAGNOSIS — G4700 Insomnia, unspecified: Secondary | ICD-10-CM | POA: Diagnosis present

## 2021-01-28 DIAGNOSIS — G4733 Obstructive sleep apnea (adult) (pediatric): Secondary | ICD-10-CM | POA: Diagnosis present

## 2021-01-28 DIAGNOSIS — Z20822 Contact with and (suspected) exposure to covid-19: Secondary | ICD-10-CM | POA: Diagnosis present

## 2021-01-28 DIAGNOSIS — Z953 Presence of xenogenic heart valve: Secondary | ICD-10-CM

## 2021-01-28 DIAGNOSIS — Z9861 Coronary angioplasty status: Secondary | ICD-10-CM

## 2021-01-28 DIAGNOSIS — J441 Chronic obstructive pulmonary disease with (acute) exacerbation: Secondary | ICD-10-CM | POA: Diagnosis not present

## 2021-01-28 DIAGNOSIS — Z09 Encounter for follow-up examination after completed treatment for conditions other than malignant neoplasm: Secondary | ICD-10-CM

## 2021-01-28 DIAGNOSIS — Z79890 Hormone replacement therapy (postmenopausal): Secondary | ICD-10-CM

## 2021-01-28 DIAGNOSIS — I5032 Chronic diastolic (congestive) heart failure: Secondary | ICD-10-CM | POA: Diagnosis present

## 2021-01-28 DIAGNOSIS — K219 Gastro-esophageal reflux disease without esophagitis: Secondary | ICD-10-CM | POA: Diagnosis present

## 2021-01-28 DIAGNOSIS — F410 Panic disorder [episodic paroxysmal anxiety] without agoraphobia: Secondary | ICD-10-CM | POA: Diagnosis present

## 2021-01-28 DIAGNOSIS — J9621 Acute and chronic respiratory failure with hypoxia: Secondary | ICD-10-CM

## 2021-01-28 DIAGNOSIS — Z803 Family history of malignant neoplasm of breast: Secondary | ICD-10-CM

## 2021-01-28 LAB — TROPONIN I (HIGH SENSITIVITY)
Troponin I (High Sensitivity): 22 ng/L — ABNORMAL HIGH (ref <18)
Troponin I (High Sensitivity): 23 ng/L — ABNORMAL HIGH (ref <18)

## 2021-01-28 LAB — D-DIMER, QUANTITATIVE: D-Dimer, Quant: 1.9 ug/mL-FEU — ABNORMAL HIGH (ref 0.00–0.50)

## 2021-01-28 LAB — CBC WITH DIFFERENTIAL/PLATELET
Abs Immature Granulocytes: 0.04 10*3/uL (ref 0.00–0.07)
Basophils Absolute: 0.1 10*3/uL (ref 0.0–0.1)
Basophils Relative: 1 %
Eosinophils Absolute: 0.2 10*3/uL (ref 0.0–0.5)
Eosinophils Relative: 3 %
HCT: 29.8 % — ABNORMAL LOW (ref 36.0–46.0)
Hemoglobin: 9.9 g/dL — ABNORMAL LOW (ref 12.0–15.0)
Immature Granulocytes: 0 %
Lymphocytes Relative: 14 %
Lymphs Abs: 1.3 10*3/uL (ref 0.7–4.0)
MCH: 29.2 pg (ref 26.0–34.0)
MCHC: 33.2 g/dL (ref 30.0–36.0)
MCV: 87.9 fL (ref 80.0–100.0)
Monocytes Absolute: 0.6 10*3/uL (ref 0.1–1.0)
Monocytes Relative: 7 %
Neutro Abs: 6.7 10*3/uL (ref 1.7–7.7)
Neutrophils Relative %: 75 %
Platelets: 142 10*3/uL — ABNORMAL LOW (ref 150–400)
RBC: 3.39 MIL/uL — ABNORMAL LOW (ref 3.87–5.11)
RDW: 13.6 % (ref 11.5–15.5)
WBC: 9 10*3/uL (ref 4.0–10.5)
nRBC: 0 % (ref 0.0–0.2)

## 2021-01-28 LAB — COMPREHENSIVE METABOLIC PANEL
ALT: 18 U/L (ref 0–44)
AST: 39 U/L (ref 15–41)
Albumin: 3.3 g/dL — ABNORMAL LOW (ref 3.5–5.0)
Alkaline Phosphatase: 90 U/L (ref 38–126)
Anion gap: 9 (ref 5–15)
BUN: 19 mg/dL (ref 8–23)
CO2: 27 mmol/L (ref 22–32)
Calcium: 8.8 mg/dL — ABNORMAL LOW (ref 8.9–10.3)
Chloride: 98 mmol/L (ref 98–111)
Creatinine, Ser: 0.92 mg/dL (ref 0.44–1.00)
GFR, Estimated: >60 mL/min (ref >60)
Glucose, Bld: 226 mg/dL — ABNORMAL HIGH (ref 70–99)
Potassium: 4.5 mmol/L (ref 3.5–5.1)
Sodium: 134 mmol/L — ABNORMAL LOW (ref 135–145)
Total Bilirubin: 0.5 mg/dL (ref 0.3–1.2)
Total Protein: 6.7 g/dL (ref 6.5–8.1)

## 2021-01-28 LAB — RESP PANEL BY RT-PCR (FLU A&B, COVID) ARPGX2
Influenza A by PCR: NEGATIVE
Influenza B by PCR: NEGATIVE
SARS Coronavirus 2 by RT PCR: NEGATIVE

## 2021-01-28 LAB — BRAIN NATRIURETIC PEPTIDE: B Natriuretic Peptide: 268.6 pg/mL — ABNORMAL HIGH (ref 0.0–100.0)

## 2021-01-28 LAB — TSH: TSH: 8.993 u[IU]/mL — ABNORMAL HIGH (ref 0.350–4.500)

## 2021-01-28 LAB — CBG MONITORING, ED: Glucose-Capillary: 253 mg/dL — ABNORMAL HIGH (ref 70–99)

## 2021-01-28 MED ORDER — IPRATROPIUM-ALBUTEROL 0.5-2.5 (3) MG/3ML IN SOLN
3.0000 mL | Freq: Once | RESPIRATORY_TRACT | Status: AC
  Administered 2021-01-28: 3 mL via RESPIRATORY_TRACT
  Filled 2021-01-28: qty 3

## 2021-01-28 MED ORDER — FLUOXETINE HCL 20 MG PO CAPS
40.0000 mg | ORAL_CAPSULE | Freq: Two times a day (BID) | ORAL | Status: DC
  Administered 2021-01-29 – 2021-01-31 (×5): 40 mg via ORAL
  Filled 2021-01-28 (×7): qty 2

## 2021-01-28 MED ORDER — MIDAZOLAM HCL 2 MG/2ML IJ SOLN: 1.0000 mg | INTRAMUSCULAR | Status: DC | PRN

## 2021-01-28 MED ORDER — ENOXAPARIN SODIUM 40 MG/0.4ML IJ SOSY
40.0000 mg | PREFILLED_SYRINGE | INTRAMUSCULAR | Status: DC
  Administered 2021-01-29 – 2021-01-31 (×2): 40 mg via SUBCUTANEOUS
  Filled 2021-01-28 (×2): qty 0.4

## 2021-01-28 MED ORDER — METHYLPREDNISOLONE SODIUM SUCC 125 MG IJ SOLR
80.0000 mg | INTRAMUSCULAR | Status: AC
  Administered 2021-01-29: 80 mg via INTRAVENOUS
  Filled 2021-01-28: qty 2

## 2021-01-28 MED ORDER — INSULIN ASPART 100 UNIT/ML IJ SOLN
0.0000 [IU] | Freq: Three times a day (TID) | INTRAMUSCULAR | Status: DC
  Administered 2021-01-29: 11 [IU] via SUBCUTANEOUS
  Administered 2021-01-29: 15 [IU] via SUBCUTANEOUS
  Filled 2021-01-28 (×2): qty 1

## 2021-01-28 MED ORDER — PANTOPRAZOLE SODIUM 40 MG PO TBEC
40.0000 mg | DELAYED_RELEASE_TABLET | Freq: Every morning | ORAL | Status: DC
  Administered 2021-01-29 – 2021-01-31 (×3): 40 mg via ORAL
  Filled 2021-01-28 (×3): qty 1

## 2021-01-28 MED ORDER — MIDAZOLAM HCL 2 MG/2ML IJ SOLN: 2.0000 mg | Freq: Once | INTRAMUSCULAR | Status: AC

## 2021-01-28 MED ORDER — LEVOTHYROXINE SODIUM 50 MCG PO TABS
125.0000 ug | ORAL_TABLET | Freq: Every day | ORAL | Status: DC
  Administered 2021-01-29 – 2021-01-31 (×3): 125 ug via ORAL
  Filled 2021-01-28: qty 1
  Filled 2021-01-28: qty 3
  Filled 2021-01-28: qty 1

## 2021-01-28 MED ORDER — DULOXETINE HCL 20 MG PO CPEP
20.0000 mg | ORAL_CAPSULE | Freq: Every morning | ORAL | Status: DC
  Administered 2021-01-29 – 2021-01-31 (×3): 20 mg via ORAL
  Filled 2021-01-28 (×4): qty 1

## 2021-01-28 MED ORDER — ENOXAPARIN SODIUM 40 MG/0.4ML IJ SOSY: 40.0000 mg | PREFILLED_SYRINGE | INTRAMUSCULAR | Status: DC

## 2021-01-28 MED ORDER — LORAZEPAM 2 MG/ML IJ SOLN
0.5000 mg | Freq: Once | INTRAMUSCULAR | Status: AC
  Administered 2021-01-28: 0.5 mg via INTRAVENOUS
  Filled 2021-01-28: qty 1

## 2021-01-28 MED ORDER — METHYLPREDNISOLONE SODIUM SUCC 125 MG IJ SOLR
125.0000 mg | Freq: Once | INTRAMUSCULAR | Status: AC
  Administered 2021-01-28: 125 mg via INTRAVENOUS
  Filled 2021-01-28: qty 2

## 2021-01-28 MED ORDER — ISOSORBIDE MONONITRATE ER 30 MG PO TB24
30.0000 mg | ORAL_TABLET | Freq: Every day | ORAL | Status: DC
  Administered 2021-01-29 – 2021-01-31 (×3): 30 mg via ORAL
  Filled 2021-01-28 (×3): qty 1

## 2021-01-28 MED ORDER — FUROSEMIDE 10 MG/ML IJ SOLN
60.0000 mg | Freq: Every morning | INTRAMUSCULAR | Status: AC
  Administered 2021-01-29: 60 mg via INTRAVENOUS
  Filled 2021-01-28: qty 8

## 2021-01-28 MED ORDER — FUROSEMIDE 10 MG/ML IJ SOLN
60.0000 mg | Freq: Once | INTRAMUSCULAR | Status: AC
  Administered 2021-01-28: 60 mg via INTRAVENOUS
  Filled 2021-01-28: qty 8

## 2021-01-28 MED ORDER — ACETAMINOPHEN 650 MG RE SUPP: 650.0000 mg | Freq: Four times a day (QID) | RECTAL | Status: DC | PRN

## 2021-01-28 MED ORDER — INSULIN ASPART 100 UNIT/ML IJ SOLN
0.0000 [IU] | Freq: Every day | INTRAMUSCULAR | Status: DC
Start: 2021-01-28 — End: 2021-01-29
  Administered 2021-01-28: 3 [IU] via SUBCUTANEOUS
  Filled 2021-01-28: qty 1

## 2021-01-28 MED ORDER — UMECLIDINIUM-VILANTEROL 62.5-25 MCG/INH IN AEPB
1.0000 | INHALATION_SPRAY | Freq: Every day | RESPIRATORY_TRACT | Status: DC
  Administered 2021-01-29 – 2021-01-31 (×3): 1 via RESPIRATORY_TRACT
  Filled 2021-01-28: qty 14

## 2021-01-28 MED ORDER — ACETAMINOPHEN 325 MG PO TABS: 650.0000 mg | ORAL_TABLET | Freq: Four times a day (QID) | ORAL | Status: DC | PRN

## 2021-01-28 MED ORDER — ONDANSETRON HCL 4 MG/2ML IJ SOLN: 4.0000 mg | Freq: Four times a day (QID) | INTRAMUSCULAR | Status: DC | PRN

## 2021-01-28 MED ORDER — ALBUTEROL SULFATE (2.5 MG/3ML) 0.083% IN NEBU: 3.0000 mL | INHALATION_SOLUTION | RESPIRATORY_TRACT | Status: DC | PRN

## 2021-01-28 MED ORDER — LOSARTAN POTASSIUM 25 MG PO TABS
25.0000 mg | ORAL_TABLET | Freq: Every morning | ORAL | Status: DC
  Administered 2021-01-29 – 2021-01-31 (×2): 25 mg via ORAL
  Filled 2021-01-28 (×2): qty 1

## 2021-01-28 MED ORDER — IPRATROPIUM-ALBUTEROL 0.5-2.5 (3) MG/3ML IN SOLN
3.0000 mL | Freq: Four times a day (QID) | RESPIRATORY_TRACT | Status: AC
  Administered 2021-01-29 – 2021-01-30 (×6): 3 mL via RESPIRATORY_TRACT
  Filled 2021-01-28 (×6): qty 3

## 2021-01-28 MED ORDER — ASPIRIN EC 81 MG PO TBEC
81.0000 mg | DELAYED_RELEASE_TABLET | Freq: Every day | ORAL | Status: DC
  Administered 2021-01-29 – 2021-01-31 (×3): 81 mg via ORAL
  Filled 2021-01-28 (×3): qty 1

## 2021-01-28 MED ORDER — TRAZODONE HCL 100 MG PO TABS
200.0000 mg | ORAL_TABLET | Freq: Every day | ORAL | Status: DC
  Administered 2021-01-28 – 2021-01-30 (×3): 200 mg via ORAL
  Filled 2021-01-28 (×3): qty 2

## 2021-01-28 MED ORDER — CLOPIDOGREL BISULFATE 75 MG PO TABS
75.0000 mg | ORAL_TABLET | Freq: Every day | ORAL | Status: DC
  Administered 2021-01-29 – 2021-01-31 (×3): 75 mg via ORAL
  Filled 2021-01-28 (×3): qty 1

## 2021-01-28 MED ORDER — MIDAZOLAM HCL 2 MG/2ML IJ SOLN
INTRAMUSCULAR | Status: AC
  Administered 2021-01-28: 2 mg via INTRAVENOUS
  Filled 2021-01-28: qty 2

## 2021-01-28 MED ORDER — FENTANYL CITRATE PF 50 MCG/ML IJ SOSY: 12.5000 ug | PREFILLED_SYRINGE | INTRAMUSCULAR | Status: DC | PRN

## 2021-01-28 MED ORDER — ONDANSETRON HCL 4 MG PO TABS
4.0000 mg | ORAL_TABLET | Freq: Four times a day (QID) | ORAL | Status: DC | PRN
  Administered 2021-01-31: 4 mg via ORAL
  Filled 2021-01-28: qty 1

## 2021-01-28 NOTE — ED Notes (Signed)
Patient switched from Bipap to 6L O2 via  per Dr. Kerman Passey.

## 2021-01-28 NOTE — ED Notes (Signed)
Patient was anxious, pulling at Bipap mask. Dr. Luis Abed informed.

## 2021-01-28 NOTE — H&P (Addendum)
History and Physical   [name] YIR:[id number] DOB: 1945/06/28 DOA: 01/28/2021  PCP: Rusty Aus, MD  Outpatient Specialists: Dr. Lucilla Lame, endocrinology  Patient coming from: Home  I have personally briefly reviewed patient's old medical records in [place].  Chief Concern: Shortness of breath  HPI: [name] is a 75 y.o. female with medical history significant for CAD status post CABG in 2015, depression, anxiety, hypertension, OSA, COPD, iron deficiency anemia, aortic dilatation, presents emergency department for chief concerns of shortness of breath.  At bedside, she is no longer tripoding and resting in ED bed and does not appear to be in acute distress. She fully participated with physical exam. She is able to tell me her full name, DOB, location of hospital, and the current calendar year.  She states she has been short of breath for several weeks and it got worse today prompting her to present to the ED. She reports that in the last months to years she has baseline shortness of breath with ambulation from bed to restroom. She denies recent fever, nausea, vomiting, abdominal pain, dysuria, diarrhea, hematuria, blood in her stool, syncope, lost of consciousness. She reports chest pain every time she coughs. She reports no new cough. She is unsure if she has had weight gain or lost. She states she may have gained a couple or a few pounds.   She does not wear a cpap as she can not tolerate it.   Social history: She is retired and formerly worked in a [employer] educational needs teacher. She lives at home with her husband. She is a former tobacco user, quit about 1 year ago, and at her peak, she smoked 0.5 ppd. She denies etoh and recreational drug use.   Vaccination history: She is vaccinated for covid 19, two doses and she does not know the brand.   ROS: Constitutional: no weight change, no fever ENT/Mouth: no sore throat, no rhinorrhea Eyes: no eye pain, no vision changes Cardiovascular:  no chest pain, no dyspnea,  no edema, no palpitations Respiratory: no cough, no sputum, no wheezing Gastrointestinal: no nausea, no vomiting, no diarrhea, no constipation Genitourinary: no urinary incontinence, no dysuria, no hematuria Musculoskeletal: no arthralgias, no myalgias Skin: no skin lesions, no pruritus, Neuro: + weakness, no loss of consciousness, no syncope Psych: no anxiety, no depression, + decrease appetite Heme/Lymph: no bruising, no bleeding  ED Course: Discussed with emergency medicine provider, patient requiring hospitalization for shortness of breath with etiology of heart failure exacerbation and/or COPD exacerbation.  Vitals in the emergency department was remarkable for temperature 98.7, respiration rate of 36, heart rate of 78, blood pressure 101/85, improved to 140/65, SPO2 of 99% on room air.  Labs in the emergency department was remarkable for sodium 134, potassium 4.5, chloride 98, bicarb 27, BUN 19, serum creatinine of 0.92, nonfasting blood glucose 226, EGFR greater than 60, WBC 9.0, hemoglobin 9.9, platelets 142, troponin was 22.  Emergency provider gave patient Lasix 60 mg IV, 1 dose of duo nebs, Solu-Medrol, Versed 2 mg IV, Ativan 0.5 mg IV, patient was placed on BiPAP with some improvement.  Patient was mildly agitated and anxious while on BiPAP and attempting to remove.  Patient is now off of BiPAP and on 6 L nasal cannula per nursing note.  Assessment/Plan  Active Problems:   Obstructive sleep apnea   Essential hypertension   Chronic obstructive pulmonary disease (HCC)   Anemia   Depression with anxiety   Coronary Artery Disease s/p  CABG 10/2013   Stenosis of coronary artery stent   Generalized weakness   Depression, major, single episode, complete remission (HCC)   COPD exacerbation (HCC)   IDA (iron deficiency anemia)   # Shortness of breath-I suspect this is COPD exacerbation complicated by anxiety - DuoNebs 4 times daily scheduled - Oxygen  supplementation as needed to maintain oxygenation over 92% - D-dimer stat, check TSH, BNP - D-dimers minimally elevated at 1.9, as patient's D-dimer has been elevated before at 1.86 and a CTA was done at that time and was negative for PE or aortic dissection, I will defer CTA at this time - At bedside patient has improved significantly with the treatments in the emergency department - DuoNebs scheduled 4 times daily, Solu-Medrol twice daily - Lasix 60 mg IV one-time dose ordered for 01/30/2019 2 in the AM  # OSA-CPAP nightly ordered however patient states she does not wear it as she can not tolerate it   # History of CAD-status post CABG in 2015 - Imdur 30 mg daily, losartan 25 mg daily - Plavix 75 mg daily  # History of insulin-dependent diabetes mellitus - Patient takes insulin regular and NPH 60 units in the morning and 60 units in the evening, this has not been resumed at this time - Glipizide has not been resumed in the hospital - Insulin SSI with at bedtime coverage ordered  # Hypothyroid-levothyroxine 125 mcg daily before breakfast resumed  # Anxiety/depression-resumed home duloxetine 20 mg daily, fluoxetine 40 mg twice daily - Midazolam 1 mg IV every 2 hours as needed for agitation and anxiety, 3 doses ordered  # Insomnia-trazodone 200 mg nightly resumed  # GERD-PPI  # COVID/influenza A/influenza B PCR were negative  Chart reviewed.   08/08/2020: D-dimer was 1.86, CTA of the chest for evaluation of aorta with and without contrast was done at that time and was read as no evidence of aortic dissection or saddle pulmonary embolus.  Interstitial pulmonary edema and trace pleural effusions.  DVT prophylaxis: Enoxaparin subcutaneous every 24 hours Code Status: Full code Diet: Heart healthy/carb modified Family Communication: No Disposition Plan: Pending clinical course Consults called: None at this time Admission status: MedSurg, observation, telemetry  Past Medical History:   Diagnosis Date   1st degree AV block    ACE-inhibitor cough    Allergic rhinitis    Anemia    iron deficiency anemia   Anxiety    Aortic ectasia ([place])    a. CT abd in 12/2016 incidentally noted aortic atherosclerosis and infrarenal abdominal aortic ectasia measuring as large as 2.7 cm with recommendation to repeat US in 2023.   Arthritis    Asthma    Cataract    Chronic depression    Chronic diastolic CHF (congestive heart failure) (HCC)    Chronic headache    COPD (chronic obstructive pulmonary disease) (HCC)    Coronary artery disease    a. DES to RCA and mid Cx 2009. b. CABG and bioprosthetic AVR May 2015. c. cutting balloon to prox Cx in 05/2016   Diabetes mellitus    type 2   Diverticulitis of colon    Essential hypertension    GERD (gastroesophageal reflux disease)    Hearing loss    History of blood transfusion 2013   History of prosthetic aortic valve replacement    HOH (hard of hearing)    Hypercholesterolemia    intolerance of statins and niaspan   IDA (iron deficiency anemia) 02/03/2019   Mobitz type 1  second degree AV block    OSA (obstructive sleep apnea)    mild, intolerant of cpap   PAD (peripheral artery disease) ([place])    a. atherosclerosis by CT abd 12/2016 in [place].   PONV (postoperative nausea and vomiting)    Statin intolerance    Thyroid disease    Past Surgical History:  Procedure Laterality Date   ABDOMINAL HYSTERECTOMY     ABDOMINAL HYSTERECTOMY W/ PARTIAL VAGINACTOMY     AORTIC VALVE REPLACEMENT N/A 10/12/2013   Procedure: AORTIC VALVE REPLACEMENT (AVR);  Surgeon: [name], MD;  Location: [facility];  Service: Open Heart Surgery;  Laterality: N/A;   APPENDECTOMY  1964   BARTHOLIN GLAND CYST EXCISION     BLADDER SUSPENSION     BREAST BIOPSY Bilateral 09/11/2000   neg   BREAST BIOPSY Left 07/24/2010   neg   BREAST CYST EXCISION  1988   bilateral nonmalignant tumors, x3   CARDIAC CATHETERIZATION     CARDIAC CATHETERIZATION N/A 05/25/2016    Procedure: Coronary Balloon Angioplasty;  Surgeon: Leonie Man, MD;  Location: [facility];  Service: Cardiovascular;  Laterality: N/A;   CARDIAC CATHETERIZATION N/A 05/25/2016   Procedure: Coronary/Graft Angiography;  Surgeon: Leonie Man, MD;  Location: [facility];  Service: Cardiovascular;  Laterality: N/A;   CATARACT EXTRACTION W/ INTRAOCULAR LENS  IMPLANT, BILATERAL     CHOLECYSTECTOMY  2001   COLECTOMY     lap sigmoid   COLONOSCOPY  2014   polyps found, 2 clamped off.   CORONARY ANGIOPLASTY  10/29/2007   Prox RCA & Mid Cx.   CORONARY ARTERY BYPASS GRAFT N/A 10/12/2013   Procedure: CORONARY ARTERY BYPASS GRAFT TIMES TWO;  Surgeon: [name], MD;  Location: [facility];  Service: Open Heart Surgery;  Laterality: N/A;   CORONARY/GRAFT ANGIOGRAPHY N/A 09/20/2017   Procedure: CORONARY/GRAFT ANGIOGRAPHY;  Surgeon: Sherren Mocha, MD;  Location: [facility];  Service: Cardiovascular;  Laterality: N/A;   LEFT HEART CATHETERIZATION WITH CORONARY ANGIOGRAM N/A 10/09/2013   Procedure: LEFT HEART CATHETERIZATION WITH CORONARY ANGIOGRAM;  Surgeon: [name], MD;  Location: [facility];  Service: Cardiovascular;  Laterality: N/A;   RIGHT/LEFT HEART CATH AND CORONARY ANGIOGRAPHY N/A 11/03/2020   Procedure: RIGHT/LEFT HEART CATH AND CORONARY ANGIOGRAPHY;  Surgeon: Corey Skains, MD;  Location: [facility];  Service: Cardiovascular;  Laterality: N/A;   STERNAL WIRES REMOVAL N/A 04/13/2014   Procedure: STERNAL WIRES REMOVAL;  Surgeon: [name], MD;  Location: [facility];  Service: Thoracic;  Laterality: N/A;   TEE WITHOUT CARDIOVERSION N/A 11/03/2020   Procedure: TRANSESOPHAGEAL ECHOCARDIOGRAM (TEE);  Surgeon: Corey Skains, MD;  Location: [facility];  Service: Cardiovascular;  Laterality: N/A;   THYROIDECTOMY     TONSILLECTOMY     TUBAL LIGATION     VAGINAL DELIVERY     3   VISCERAL ARTERY INTERVENTION N/A 08/16/2016   Procedure: Visceral Artery  Intervention;  Surgeon: Algernon Huxley, MD;  Location: [facility];  Service: Cardiovascular;  Laterality: N/A;   Social History:  reports that she quit smoking about 7 years ago. Her smoking use included cigarettes. She has a 15.00 pack-year smoking history. She has never used smokeless tobacco. She reports that she does not drink alcohol and does not use drugs.  Allergies  Allergen Reactions   Amitriptyline Other (See Comments)    Unknown reaction   Benadryl [Diphenhydramine] Shortness Of Breath   Demerol [Meperidine]  Other (See Comments)    Unknown reaction   Gabapentin Other (See Comments)    Unknown reaction   Meperidine Hcl Other (See Comments)    Unknown reaction   Mirtazapine Other (See Comments)    Unknown reaction   Olanzapine Other (See Comments)    Unknown reaction    Voltaren [Diclofenac Sodium] Shortness Of Breath   Zetia [Ezetimibe] Other (See Comments)    Weakness in legs, shakiness all over   Ativan [Lorazepam] Other (See Comments)    Causes double vision at highter than .5 mg dose   Atorvastatin Other (See Comments)    Muscle aches and weakness   Budesonide-Formoterol Fumarate Other (See Comments)    Shakiness, tremors   Bupropion Hcl Other (See Comments)    "cloud over me" depression   Caffeine Other (See Comments)    jitters   Codeine Sulfate Other (See Comments)    Makes chest hurt like a heart attack   Lisinopril Cough   Metformin Nausea And Vomiting   Mometasone Furoate Nausea And Vomiting   Morphine Sulfate Other (See Comments)    Chest pain like a heart attack   Other Other (See Comments)    Beta Blockers, reaction shortness of breath   Oxycodone-Acetaminophen Nausea And Vomiting   Pioglitazone Other (See Comments)    Cannot take because of risk of bladder cancer   Propoxyphene N-Acetaminophen Nausea And Vomiting   Rosuvastatin Other (See Comments)    Muscle aches and weakness   Shellfish Allergy Diarrhea   Suvorexant Other (See  Comments)    Jerking/nervous    Ticagrelor     Other reaction(s): Other (See Comments) "slowed heart rate" & chest pain   Tramadol Nausea Only   Trazodone And Nefazodone Nausea And Vomiting   Venlafaxine Other (See Comments)    Unknown reaction   Zolpidem Tartrate Other (See Comments)     Jittery, diarrhea   Latex Rash   Family History  Problem Relation Age of Onset   Breast cancer Mother 61   Hypertension Father    Mesothelioma Father    Asthma Father    Stroke Paternal Grandfather    Heart disease Other    Breast cancer Maternal Aunt    Breast cancer Paternal Aunt    Family history: Family history reviewed and not pertinent  Prior to Admission medications   Medication Sig Start Date End Date Taking? Authorizing Provider  acetaminophen (TYLENOL) 500 MG tablet Take 500-1,000 mg by mouth every 6 (six) hours as needed (pain).   Yes [provider]  albuterol (PROVENTIL) (2.5 MG/3ML) 0.083% nebulizer solution Take 2.5 mg by nebulization every 4 (four) hours as needed for shortness of breath or wheezing. 06/07/20  Yes [provider]  ANORO ELLIPTA 62.5-25 MCG/INH AEPB Inhale 1 puff into the lungs daily. 10/11/20  Yes [provider]  aspirin EC 81 MG EC tablet Take 1 tablet (81 mg total) by mouth daily. Swallow whole. 08/10/20  Yes Samuella Cota, MD  Calcium-Vitamin D (CALTRATE 600 PLUS-VIT D PO) Take 1 tablet by mouth 2 (two) times daily.   Yes [provider]  clopidogrel (PLAVIX) 75 MG tablet Take 75 mg by mouth daily. 11/15/20  Yes [provider]  DULoxetine (CYMBALTA) 20 MG capsule Take 1 capsule (20 mg total) by mouth in the morning. 11/26/20  Yes Jennye Boroughs, MD  FLUoxetine (PROZAC) 40 MG capsule Take 40 mg by mouth 2 (two) times daily. 08/23/20  Yes [provider]  furosemide (LASIX)  20 MG tablet Take 1 tablet (20 mg total) by mouth 2 (two) times daily. 11/26/20 01/28/21 Yes Jennye Boroughs, MD  glipiZIDE (GLUCOTROL XL) 10  MG 24 hr tablet Take 10 mg by mouth in the morning. 07/19/20  Yes [provider]  insulin NPH-regular Human (NOVOLIN 70/30) (70-30) 100 UNIT/ML injection Inject 60 Units into the skin 2 (two) times daily with a meal. Inject 60u under the skin every morning at breakfast and inject 60u under the skin every evening with supper   Yes [provider]  isosorbide mononitrate (IMDUR) 30 MG 24 hr tablet Take 1 tablet (30 mg total) by mouth daily. 11/03/20 11/03/21 Yes Corey Skains, MD  levothyroxine (SYNTHROID, LEVOTHROID) 125 MCG tablet Take 125 mcg by mouth daily before breakfast. 04/03/17  Yes [provider]  losartan (COZAAR) 25 MG tablet Take 1 tablet (25 mg total) by mouth in the morning. 11/26/20  Yes Jennye Boroughs, MD  nystatin (MYCOSTATIN) 100000 UNIT/ML suspension Take by mouth. 11/21/20  Yes [provider]  pantoprazole (PROTONIX) 40 MG tablet Take 40 mg by mouth in the morning.   Yes [provider]  potassium chloride (KLOR-CON) 10 MEQ tablet Take 1 tablet (10 mEq total) by mouth daily. 11/26/20  Yes Jennye Boroughs, MD  promethazine (PHENERGAN) 25 MG tablet Take 25 mg by mouth every 6 (six) hours as needed for nausea or vomiting.   Yes [provider]  torsemide (DEMADEX) 20 MG tablet Take 20 mg by mouth daily. 12/08/20  Yes [provider]  traZODone (DESYREL) 100 MG tablet Take 200 mg by mouth at bedtime.   Yes [provider]   Physical Exam: Vitals:   01/28/21 [place] 01/28/21 1850 01/28/21 2000 01/28/21 2100  BP: (!) 147/80 101/85 (!) 150/70 140/65  Pulse: 87 78 81 69  Resp: (!) 36  20 16  Temp: 98.7 F (37.1 C)     TempSrc: Oral     SpO2: 99% 99% 100% 100%   Constitutional: appears age-appropriate, NAD, calm, comfortable Eyes: PERRL, lids and conjunctivae normal ENMT: Mucous membranes are moist. Posterior pharynx clear of any exudate or lesions. Age-appropriate dentition.  Moderate to severe hearing loss Neck:  normal, supple, no masses, no thyromegaly Respiratory: clear to auscultation bilaterally, no wheezing, no crackles. Normal respiratory effort. No accessory muscle use.  Cardiovascular: Regular rate and rhythm, no murmurs / rubs / gallops. No extremity edema. 2+ pedal pulses. No carotid bruits.  Abdomen: obese abdomen, no tenderness, no masses palpated, no hepatosplenomegaly. Bowel sounds positive.  Musculoskeletal: no clubbing / cyanosis. No joint deformity upper and lower extremities. Good ROM, no contractures, no atrophy. Normal muscle tone.  Skin: no rashes, lesions, ulcers. No induration Neurologic: Sensation intact. Strength 5/5 in all 4.  Psychiatric: Normal judgment and insight. Alert and oriented x 3. Normal mood.   EKG: independently reviewed, showing sinus rhythm with rate of 85, QTc 483, LVH  Chest x-ray on Admission: I personally reviewed and I agree with radiologist reading as below.  DG Chest Port 1 View  Result Date: 01/28/2021 CLINICAL DATA:  Difficulty breathing.  Shortness of breath. EXAM: PORTABLE CHEST 1 VIEW COMPARISON:  November 24, 2020 FINDINGS: Stable cardiomegaly. The hila and mediastinum are normal. No pneumothorax. No nodules or masses. No focal infiltrates. Increased interstitial opacities in the lungs suggesting mild edema. IMPRESSION: Cardiomegaly and mild pulmonary edema. No other acute abnormalities. Electronically Signed   By: Dorise Bullion III M.D.   On: 01/28/2021 19:17  Labs on Admission: I have personally reviewed following labs  CBC: Recent Labs  Lab 01/28/21 1959  WBC 9.0  NEUTROABS 6.7  HGB 9.9*  HCT 29.8*  MCV 87.9  PLT 315*   Basic Metabolic Panel: Recent Labs  Lab 01/28/21 1959  NA 134*  K 4.5  CL 98  CO2 27  GLUCOSE 226*  BUN 19  CREATININE 0.92  CALCIUM 8.8*   GFR: CrCl cannot be calculated (Unknown ideal weight.).  Liver Function Tests: Recent Labs  Lab 01/28/21 1959  AST 39  ALT 18  ALKPHOS 90  BILITOT 0.5  PROT 6.7   ALBUMIN 3.3*   Urine analysis:    Component Value Date/Time   COLORURINE STRAW (A) 11/25/2020 0420   APPEARANCEUR CLEAR (A) 11/25/2020 0420   APPEARANCEUR Clear 11/25/2012 1335   LABSPEC 1.003 (L) 11/25/2020 0420   LABSPEC 1.017 11/25/2012 1335   PHURINE 6.0 11/25/2020 0420   GLUCOSEU 50 (A) 11/25/2020 0420   GLUCOSEU Negative 11/25/2012 1335   HGBUR SMALL (A) 11/25/2020 0420   HGBUR negative 11/11/2009 0854   BILIRUBINUR NEGATIVE 11/25/2020 0420   BILIRUBINUR Negative 11/25/2012 1335   KETONESUR NEGATIVE 11/25/2020 0420   PROTEINUR NEGATIVE 11/25/2020 0420   UROBILINOGEN 0.2 11/11/2009 0854   NITRITE NEGATIVE 11/25/2020 0420   LEUKOCYTESUR NEGATIVE 11/25/2020 0420   LEUKOCYTESUR Negative 11/25/2012 1335   Dr. Tobie Poet [facility]  If 7PM-7AM, please contact overnight-coverage provider If 7AM-7PM, please contact day coverage provider [email or web address]  01/28/2021, 9:32 PM

## 2021-01-28 NOTE — ED Provider Notes (Signed)
-----------------------------------------   8:06 PM on 01/28/2021 ----------------------------------------- Patient care assumed from Dr. Tamala Julian.  Patient continues to feel short of breath currently on BiPAP but appears improved.  Continues to have an elevated respiratory rate.  Portable chest x-ray shows mild pulmonary edema.  Lab work is pending.  Patient satting 99% on BiPAP currently.  EKG viewed and interpreted by myself shows what appears to be sinus rhythm 82 bpm with a narrow QRS, normal axis, slight QTC prolongation otherwise normal intervals, nonspecific ST changes.  No ST elevation.  Patient continues to be short of breath.  States she wants to come off the BiPAP mask.  We will try the patient on nasal cannula.  Patient has received IV Lasix.  We will dose a small dose of IV Ativan given the patient's anxiety.  We will admit to the hospital service for pulmonary edema/dyspnea.   Harvest Dark, MD 01/28/21 2106

## 2021-01-28 NOTE — ED Notes (Signed)
Pt placed on 3 liter's [place]- states she uses chronically

## 2021-01-28 NOTE — ED Provider Notes (Signed)
[facility] Emergency Department Provider Note ____________________________________________   Event Date/Time   First MD Initiated Contact with Patient 01/28/21 1855     (approximate)  I have reviewed the triage vital signs and the nursing notes.  HISTORY  Chief Complaint Shortness of Breath   HPI [name] is a 75 y.o. femalewho presents to the ED for evaluation of SOB  Chart review indicates dCHF and COPD. DM.  Chronic respiratory failure on 3 L home O2.  Patient presents to the ED for evaluation of acutely worsening shortness of breath.  History is limited by her respiratory status.  She is tripoding, short of breath and repeatedly saying "please help me, I cannot breathe." Reports worsening symptoms over the past day today.  Past Medical History:  Diagnosis Date   1st degree AV block    ACE-inhibitor cough    Allergic rhinitis    Anemia    iron deficiency anemia   Anxiety    Aortic ectasia (HCC)    a. CT abd in 12/2016 incidentally noted aortic atherosclerosis and infrarenal abdominal aortic ectasia measuring as large as 2.7 cm with recommendation to repeat US in 2023.   Arthritis    Asthma    Cataract    Chronic depression    Chronic diastolic CHF (congestive heart failure) (HCC)    Chronic headache    COPD (chronic obstructive pulmonary disease) (HCC)    Coronary artery disease    a. DES to RCA and mid Cx 2009. b. CABG and bioprosthetic AVR May 2015. c. cutting balloon to prox Cx in 05/2016   Diabetes mellitus    type 2   Diverticulitis of colon    Essential hypertension    GERD (gastroesophageal reflux disease)    Hearing loss    History of blood transfusion 2013   History of prosthetic aortic valve replacement    HOH (hard of hearing)    Hypercholesterolemia    intolerance of statins and niaspan   IDA (iron deficiency anemia) 02/03/2019   Mobitz type 1 second degree AV block    OSA (obstructive sleep apnea)    mild,  intolerant of cpap   PAD (peripheral artery disease) ([place])    a. atherosclerosis by CT abd 12/2016 in [place].   PONV (postoperative nausea and vomiting)    Statin intolerance    Thyroid disease     Patient Active Problem List   Diagnosis Date Noted   Acute on chronic diastolic (congestive) heart failure (HCC) 11/24/2020   SOB (shortness of breath) 11/24/2020   Acute hyponatremia 11/24/2020   Prolonged QT interval 11/24/2020   Angina pectoris ([place]) 11/03/2020   CHF exacerbation ([place]) 09/07/2020   Elevated troponin 09/07/2020   Chest pain 08/09/2020   Acute on chronic diastolic CHF (congestive heart failure) ([place]) 08/08/2020   Anemia due to stage 3a chronic kidney disease ([place]) 05/21/2019   IDA (iron deficiency anemia) 02/03/2019   COPD exacerbation ([place]) 12/28/2017   Depression, major, single episode, complete remission ([place]) 04/11/2017   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   Aortic ectasia, abdominal ([place]) 01/01/2017   GAD (generalized anxiety disorder) 09/26/2016   Acute posthemorrhagic anemia 08/24/2016   Acute respiratory failure with hypoxia ([place]) [id number]   Acute diastolic CHF (congestive heart failure) ([place]) 08/24/2016   Hyponatremia 08/24/2016   Leukocytosis 08/24/2016   Fever 08/24/2016   Diarrhea 08/24/2016   Generalized weakness 08/24/2016   Acute diverticulitis 08/16/2016   Gastrointestinal hemorrhage    Ataxia 08/09/2016  Dizziness 08/09/2016   Staggering gait 08/09/2016   NSTEMI (non-ST elevated myocardial infarction) [facility])    AVB (atrioventricular block)    Atypical chest pain    Unstable angina ([place]) 05/25/2016   Palpitations 05/25/2016   PUD - recent Rx for H.Pylori 05/25/2016   Stenosis of coronary artery stent    Recurrent major depressive disorder, in partial remission ([place]) 07/29/2015   Sialadenitis 04/25/2015   Viral URI 03/24/2015   Hypoglycemia 08/25/2014   Nausea without vomiting 08/25/2014   Dyslipidemia-statin ontol    Long-term  insulin use ([place]) 03/31/2014   Aortic valve disorder 11/11/2013   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 10/12/2013   Coronary Artery Disease s/p CABG 10/2013 10/10/2013   Other malaise and fatigue 09/11/2013   Alopecia 03/06/2013   Insomnia 02/06/2013   Gastroesophageal reflux disease without esophagitis 01/19/2013   Dysphagia, unspecified(787.20) 01/19/2013   Dyspnea 11/17/2012   Weakness 11/17/2012   Abnormal MRI of head 09/05/2012   Depression with anxiety 03/21/2012   Extrinsic asthma 11/12/2011   Anemia 06/07/2011   Chronic obstructive pulmonary disease ([place]) 01/25/2010   Acquired hypothyroidism 08/10/2009   MICROSCOPIC HEMATURIA 05/07/2009   GOITER 03/02/2009   Essential hypertension 01/06/2009   MEMORY LOSS 06/23/2008   Obstructive sleep apnea 12/25/2007   DM type 2 (diabetes mellitus, type 2) ([place]) 09/09/2006   ALLERGIC RHINITIS 09/09/2006   DIVERTICULOSIS, COLON 09/09/2006    Past Surgical History:  Procedure Laterality Date   ABDOMINAL HYSTERECTOMY     ABDOMINAL HYSTERECTOMY W/ PARTIAL VAGINACTOMY     AORTIC VALVE REPLACEMENT N/A 10/12/2013   Procedure: AORTIC VALVE REPLACEMENT (AVR);  Surgeon: [name], MD;  Location: [place];  Service: Open Heart Surgery;  Laterality: N/A;   APPENDECTOMY  1964   BARTHOLIN GLAND CYST EXCISION     BLADDER SUSPENSION     BREAST BIOPSY Bilateral 09/11/2000   neg   BREAST BIOPSY Left 07/24/2010   neg   BREAST CYST EXCISION  1988   bilateral nonmalignant tumors, x3   CARDIAC CATHETERIZATION     CARDIAC CATHETERIZATION N/A 05/25/2016   Procedure: Coronary Balloon Angioplasty;  Surgeon: Leonie Man, MD;  Location: [place] [facility];  Service: Cardiovascular;  Laterality: N/A;   CARDIAC CATHETERIZATION N/A 05/25/2016   Procedure: Coronary/Graft Angiography;  Surgeon: Leonie Man, MD;  Location: [place] [facility];  Service: Cardiovascular;  Laterality: N/A;   CATARACT EXTRACTION W/ INTRAOCULAR LENS  IMPLANT, BILATERAL     CHOLECYSTECTOMY   2001   COLECTOMY     lap sigmoid   COLONOSCOPY  2014   polyps found, 2 clamped off.   CORONARY ANGIOPLASTY  10/29/2007   Prox RCA & Mid Cx.   CORONARY ARTERY BYPASS GRAFT N/A 10/12/2013   Procedure: CORONARY ARTERY BYPASS GRAFT TIMES TWO;  Surgeon: [name], MD;  Location: [place] [facility];  Service: Open Heart Surgery;  Laterality: N/A;   CORONARY/GRAFT ANGIOGRAPHY N/A 09/20/2017   Procedure: CORONARY/GRAFT ANGIOGRAPHY;  Surgeon: Sherren Mocha, MD;  Location: [place] [facility];  Service: Cardiovascular;  Laterality: N/A;   LEFT HEART CATHETERIZATION WITH CORONARY ANGIOGRAM N/A 10/09/2013   Procedure: LEFT HEART CATHETERIZATION WITH CORONARY ANGIOGRAM;  Surgeon: [name], MD;  Location: [facility] CATH LAB;  Service: Cardiovascular;  Laterality: N/A;   RIGHT/LEFT HEART CATH AND CORONARY ANGIOGRAPHY N/A 11/03/2020   Procedure: RIGHT/LEFT HEART CATH AND CORONARY ANGIOGRAPHY;  Surgeon: Corey Skains, MD;  Location: [place] [facility];  Service: Cardiovascular;  Laterality: N/A;   STERNAL  WIRES REMOVAL N/A 04/13/2014   Procedure: STERNAL WIRES REMOVAL;  Surgeon: [name], MD;  Location: [place];  Service: Thoracic;  Laterality: N/A;   TEE WITHOUT CARDIOVERSION N/A 11/03/2020   Procedure: TRANSESOPHAGEAL ECHOCARDIOGRAM (TEE);  Surgeon: Corey Skains, MD;  Location: [facility] ORS;  Service: Cardiovascular;  Laterality: N/A;   THYROIDECTOMY     TONSILLECTOMY     TUBAL LIGATION     VAGINAL DELIVERY     3   VISCERAL ARTERY INTERVENTION N/A 08/16/2016   Procedure: Visceral Artery Intervention;  Surgeon: Algernon Huxley, MD;  Location: [place] [facility];  Service: Cardiovascular;  Laterality: N/A;    Prior to Admission medications   Medication Sig Start Date End Date Taking? Authorizing Provider  acetaminophen (TYLENOL) 500 MG tablet Take 500-1,000 mg by mouth every 6 (six) hours as needed (pain).    [provider]  albuterol (PROVENTIL) (2.5 MG/3ML) 0.083% nebulizer solution Take 2.5  mg by nebulization every 4 (four) hours as needed for shortness of breath or wheezing. 06/07/20   [provider]  ANORO ELLIPTA 62.5-25 MCG/INH AEPB Inhale 1 puff into the lungs daily. 10/11/20   [provider]  aspirin EC 81 MG EC tablet Take 1 tablet (81 mg total) by mouth daily. Swallow whole. 08/10/20   Samuella Cota, MD  Calcium-Vitamin D (CALTRATE 600 PLUS-VIT D PO) Take 1 tablet by mouth 2 (two) times daily.    [provider]  clopidogrel (PLAVIX) 75 MG tablet Take 75 mg by mouth daily. 11/15/20   [provider]  DULoxetine (CYMBALTA) 20 MG capsule Take 1 capsule (20 mg total) by mouth in the morning. 11/26/20   Jennye Boroughs, MD  FLUoxetine (PROZAC) 40 MG capsule Take 40 mg by mouth 2 (two) times daily. 08/23/20   [provider]  furosemide (LASIX) 20 MG tablet Take 1 tablet (20 mg total) by mouth 2 (two) times daily. 11/26/20 12/26/20  Jennye Boroughs, MD  glipiZIDE (GLUCOTROL XL) 10 MG 24 hr tablet Take 10 mg by mouth in the morning. 07/19/20   [provider]  insulin NPH-regular Human (NOVOLIN 70/30) (70-30) 100 UNIT/ML injection Inject 60 Units into the skin 2 (two) times daily with a meal. Inject 60u under the skin every morning at breakfast and inject 60u under the skin every evening with supper    [provider]  isosorbide mononitrate (IMDUR) 30 MG 24 hr tablet Take 1 tablet (30 mg total) by mouth daily. 11/03/20 11/03/21  Corey Skains, MD  levothyroxine (SYNTHROID, LEVOTHROID) 125 MCG tablet Take 125 mcg by mouth daily before breakfast. 04/03/17   [provider]  losartan (COZAAR) 25 MG tablet Take 1 tablet (25 mg total) by mouth in the morning. 11/26/20   Jennye Boroughs, MD  nystatin (MYCOSTATIN) 100000 UNIT/ML suspension Take by mouth. 11/21/20   [provider]  pantoprazole (PROTONIX) 40 MG tablet Take 40 mg by mouth in the morning.    [provider]  potassium chloride (KLOR-CON) 10 MEQ  tablet Take 1 tablet (10 mEq total) by mouth daily. 11/26/20   Jennye Boroughs, MD  promethazine (PHENERGAN) 25 MG tablet Take 25 mg by mouth every 6 (six) hours as needed for nausea or vomiting.    [provider]  torsemide (DEMADEX) 20 MG tablet Take 20 mg by mouth daily. 12/08/20   [provider]  traZODone (DESYREL) 100 MG tablet Take 200 mg by mouth at bedtime.    [provider]  Allergies Amitriptyline, Benadryl [diphenhydramine], Demerol [meperidine], Gabapentin, Meperidine hcl, Mirtazapine, Olanzapine, Voltaren [diclofenac sodium], Zetia [ezetimibe], Ativan [lorazepam], Atorvastatin, Budesonide-formoterol fumarate, Bupropion hcl, Caffeine, Codeine sulfate, Lisinopril, Metformin, Mometasone furoate, Morphine sulfate, Other, Oxycodone-acetaminophen, Pioglitazone, Propoxyphene n-acetaminophen, Rosuvastatin, Shellfish allergy, Suvorexant, Ticagrelor, Tramadol, Trazodone and nefazodone, Venlafaxine, Zolpidem tartrate, and Latex  Family History  Problem Relation Age of Onset   Breast cancer Mother 58   Hypertension Father    Mesothelioma Father    Asthma Father    Stroke Paternal Grandfather    Heart disease Other    Breast cancer Maternal Aunt    Breast cancer Paternal Aunt     Social History Social History   Tobacco Use   Smoking status: Former    Packs/day: 0.50    Years: 30.00    Pack years: 15.00    Types: Cigarettes    Quit date: 10/02/2013    Years since quitting: 7.3   Smokeless tobacco: Never  Vaping Use   Vaping Use: Never used  Substance Use Topics   Alcohol use: No   Drug use: No    Review of Systems  Unable to be accurately assessed due to patient's respiratory status and acuity of condition. ____________________________________________   PHYSICAL EXAM:  VITAL SIGNS: Vitals:   01/28/21 1835 01/28/21 1850  BP: (!) 147/80 101/85  Pulse: 87 78  Resp: (!) 36   Temp: 98.7 F (37.1 C)   SpO2: 99% 99%     Constitutional:  Alert and oriented.  Sitting up on the side of the stretcher, tripoding and obviously tachypneic and dyspneic.  Speaking in short phrases only. Eyes: Conjunctivae are normal. PERRL. EOMI. Head: Atraumatic. Nose: No congestion/rhinnorhea. Mouth/Throat: Mucous membranes are moist.  Oropharynx non-erythematous. Neck: No stridor. No cervical spine tenderness to palpation. Cardiovascular: Normal rate, regular rhythm. Grossly normal heart sounds.  Good peripheral circulation. Respiratory: Tachypneic to nearly 40.  Bibasilar crackles and poor air movement throughout.  No focal features. Gastrointestinal: Soft , nondistended, nontender to palpation. No CVA tenderness. Musculoskeletal: No lower extremity tenderness  No joint effusions. No signs of acute trauma. Pitting edema to bilateral lower extremities Neurologic:  Normal speech and language. No gross focal neurologic deficits are appreciated.  Skin:  Skin is warm, dry and intact. No rash noted. Psychiatric: Mood and affect are normal. Speech and behavior are normal.  ____________________________________________   LABS (all labs ordered are listed, but only abnormal results are displayed)  Labs Reviewed  RESP PANEL BY RT-PCR (FLU A&B, COVID) ARPGX2  BLOOD GAS, ARTERIAL  COMPREHENSIVE METABOLIC PANEL  CBC WITH DIFFERENTIAL/PLATELET  BRAIN NATRIURETIC PEPTIDE  TROPONIN I (HIGH SENSITIVITY)   ____________________________________________  12 Lead EKG  Sinus rhythm with a rate of 85 bpm.  Normal axis and intervals.  T wave inversions isolated to aVL.  No STEMI. Similar to EKG from 2 months ago. ____________________________________________  RADIOLOGY  ED MD interpretation: 1 view CXR reviewed by me with pulmonary vascular congestion  Official radiology report(s): DG Chest Port 1 View  Result Date: 01/28/2021 CLINICAL DATA:  Difficulty breathing.  Shortness of breath. EXAM: PORTABLE CHEST 1 VIEW COMPARISON:  November 24, 2020 FINDINGS:  Stable cardiomegaly. The hila and mediastinum are normal. No pneumothorax. No nodules or masses. No focal infiltrates. Increased interstitial opacities in the lungs suggesting mild edema. IMPRESSION: Cardiomegaly and mild pulmonary edema. No other acute abnormalities. Electronically Signed   By: Dorise Bullion III M.D.   On: 01/28/2021 19:17    ____________________________________________   PROCEDURES and INTERVENTIONS  Procedure(s)  performed (including Critical Care):  .1-3 Lead EKG Interpretation  Date/Time: 01/28/2021 7:55 PM Performed by: Vladimir Crofts, MD Authorized by: Vladimir Crofts, MD     Interpretation: normal     ECG rate:  80   ECG rate assessment: normal     Rhythm: sinus rhythm     Ectopy: none     Conduction: normal   .Critical Care  Date/Time: 01/28/2021 7:56 PM Performed by: Vladimir Crofts, MD Authorized by: Vladimir Crofts, MD   Critical care provider statement:    Critical care time (minutes):  45   Critical care was necessary to treat or prevent imminent or life-threatening deterioration of the following conditions:  Respiratory failure   Critical care was time spent personally by me on the following activities:  Discussions with consultants, evaluation of patient's response to treatment, examination of patient, ordering and performing treatments and interventions, ordering and review of laboratory studies, ordering and review of radiographic studies, pulse oximetry, re-evaluation of patient's condition, obtaining history from patient or surrogate and review of old charts  Medications  ipratropium-albuterol (DUONEB) 0.5-2.5 (3) MG/3ML nebulizer solution 3 mL (has no administration in time range)  methylPREDNISolone sodium succinate (SOLU-MEDROL) 125 mg/2 mL injection 125 mg (125 mg Intravenous Given 01/28/21 1921)  furosemide (LASIX) injection 60 mg (60 mg Intravenous Given 01/28/21 1929)  midazolam (VERSED) injection 2 mg (2 mg Intravenous Given 01/28/21 1920)     ____________________________________________   MDM / ED COURSE   75 year old female presents to the ED with evidence of CHF and COPD observations requiring BiPAP.  Presents tripoding and looking unwell, limiting history acquisition.  CXR is congested without PTX.  Started on BiPAP with improvement of her respiratory status.  No evidence of STEMI on EKG.  Awaiting remainder of blood work prior to expected medical admission.  Clinical Course as of 01/28/21 1955  Sat Jan 28, 2021  1914 Reassessed as bipap is going on [DS]  1946 Reassessed. Clinically improving. Will sign out to oncoming provider to f/u on remainder of workup with expectations of medical admission [DS]    Clinical Course User Index [DS] Vladimir Crofts, MD    ____________________________________________   FINAL CLINICAL IMPRESSION(S) / ED DIAGNOSES  Final diagnoses:  SOB (shortness of breath)     ED Discharge Orders     None        Sharod Petsch   Note:  This document was prepared using Dragon voice recognition software and may include unintentional dictation errors.    Vladimir Crofts, MD 01/28/21 [phone]

## 2021-01-28 NOTE — ED Triage Notes (Signed)
Pt c/o SOB that has been intermittent x2 months, worse this week. Pt is hard of hearing, AOX4. Respiration are even, labored.

## 2021-01-29 DIAGNOSIS — F419 Anxiety disorder, unspecified: Secondary | ICD-10-CM

## 2021-01-29 DIAGNOSIS — G4733 Obstructive sleep apnea (adult) (pediatric): Secondary | ICD-10-CM | POA: Diagnosis not present

## 2021-01-29 LAB — BASIC METABOLIC PANEL
Anion gap: 12 (ref 5–15)
BUN: 24 mg/dL — ABNORMAL HIGH (ref 8–23)
CO2: 28 mmol/L (ref 22–32)
Calcium: 8.6 mg/dL — ABNORMAL LOW (ref 8.9–10.3)
Chloride: 92 mmol/L — ABNORMAL LOW (ref 98–111)
Creatinine, Ser: 1.05 mg/dL — ABNORMAL HIGH (ref 0.44–1.00)
GFR, Estimated: 55 mL/min — ABNORMAL LOW (ref >60)
Glucose, Bld: 394 mg/dL — ABNORMAL HIGH (ref 70–99)
Potassium: 4.9 mmol/L (ref 3.5–5.1)
Sodium: 132 mmol/L — ABNORMAL LOW (ref 135–145)

## 2021-01-29 LAB — GLUCOSE, CAPILLARY
Glucose-Capillary: 477 mg/dL — ABNORMAL HIGH (ref 70–99)
Glucose-Capillary: 516 mg/dL (ref 70–99)
Glucose-Capillary: 456 mg/dL — ABNORMAL HIGH (ref 70–99)

## 2021-01-29 LAB — CBC
HCT: 32.2 % — ABNORMAL LOW (ref 36.0–46.0)
Hemoglobin: 10.5 g/dL — ABNORMAL LOW (ref 12.0–15.0)
MCH: 28 pg (ref 26.0–34.0)
MCHC: 32.6 g/dL (ref 30.0–36.0)
MCV: 85.9 fL (ref 80.0–100.0)
Platelets: 167 10*3/uL (ref 150–400)
RBC: 3.75 MIL/uL — ABNORMAL LOW (ref 3.87–5.11)
RDW: 13.4 % (ref 11.5–15.5)
WBC: 8.8 10*3/uL (ref 4.0–10.5)
nRBC: 0 % (ref 0.0–0.2)

## 2021-01-29 LAB — CBG MONITORING, ED
Glucose-Capillary: 343 mg/dL — ABNORMAL HIGH (ref 70–99)
Glucose-Capillary: 454 mg/dL — ABNORMAL HIGH (ref 70–99)
Glucose-Capillary: 455 mg/dL — ABNORMAL HIGH (ref 70–99)

## 2021-01-29 MED ORDER — HYDROXYZINE HCL 25 MG PO TABS
25.0000 mg | ORAL_TABLET | Freq: Three times a day (TID) | ORAL | Status: DC | PRN
  Administered 2021-01-30: 25 mg via ORAL
  Filled 2021-01-29: qty 1

## 2021-01-29 MED ORDER — INSULIN ASPART 100 UNIT/ML IJ SOLN
0.0000 [IU] | Freq: Three times a day (TID) | INTRAMUSCULAR | Status: DC
  Administered 2021-01-30: 15 [IU] via SUBCUTANEOUS
  Administered 2021-01-30: 11 [IU] via SUBCUTANEOUS
  Administered 2021-01-30: 15 [IU] via SUBCUTANEOUS
  Administered 2021-01-31: 8 [IU] via SUBCUTANEOUS
  Administered 2021-01-31: 11 [IU] via SUBCUTANEOUS
  Filled 2021-01-29 (×6): qty 1

## 2021-01-29 MED ORDER — INSULIN ASPART 100 UNIT/ML IJ SOLN
20.0000 [IU] | Freq: Once | INTRAMUSCULAR | Status: AC
  Administered 2021-01-30: 20 [IU] via SUBCUTANEOUS
  Filled 2021-01-29: qty 1

## 2021-01-29 MED ORDER — INSULIN GLARGINE-YFGN 100 UNIT/ML ~~LOC~~ SOLN
20.0000 [IU] | Freq: Once | SUBCUTANEOUS | Status: AC
  Administered 2021-01-30: 20 [IU] via SUBCUTANEOUS
  Filled 2021-01-29: qty 0.2

## 2021-01-29 MED ORDER — INSULIN GLARGINE-YFGN 100 UNIT/ML ~~LOC~~ SOLN
10.0000 [IU] | Freq: Every day | SUBCUTANEOUS | Status: DC
  Administered 2021-01-29 – 2021-01-31 (×3): 10 [IU] via SUBCUTANEOUS
  Filled 2021-01-29 (×5): qty 0.1

## 2021-01-29 MED ORDER — LORAZEPAM 2 MG/ML IJ SOLN
0.5000 mg | Freq: Three times a day (TID) | INTRAMUSCULAR | Status: DC | PRN
  Administered 2021-01-29 – 2021-01-30 (×2): 0.5 mg via INTRAVENOUS
  Filled 2021-01-29 (×2): qty 1

## 2021-01-29 MED ORDER — INSULIN ASPART 100 UNIT/ML IJ SOLN
20.0000 [IU] | Freq: Once | INTRAMUSCULAR | Status: AC
  Administered 2021-01-29: 20 [IU] via SUBCUTANEOUS
  Filled 2021-01-29: qty 1

## 2021-01-29 MED ORDER — INSULIN ASPART 100 UNIT/ML IV SOLN
20.0000 [IU] | Freq: Once | INTRAVENOUS | Status: DC
  Filled 2021-01-29: qty 0.2

## 2021-01-29 NOTE — Progress Notes (Signed)
Patient brought to the room & vitals and tele completed. Patient under no distress at this time.

## 2021-01-29 NOTE — ED Notes (Signed)
RN to bedside to introduce self to pt. Pt sleeping. Replaced BP cuff to obtain new set of vitals.

## 2021-01-29 NOTE — Progress Notes (Signed)
Pt is refusing to wear CPAP. Pt is aware that RT can bring her one if she changes her mind.

## 2021-01-29 NOTE — Progress Notes (Signed)
PROGRESS NOTE    [name]  [id number] DOB: March 06, 1946 DOA: 01/28/2021 PCP: Rusty Aus, MD   Assessment & Plan:   Active Problems:   Obstructive sleep apnea   Essential hypertension   Chronic obstructive pulmonary disease (HCC)   Anemia   Depression with anxiety   Coronary Artery Disease s/p CABG 10/2013   Stenosis of coronary artery stent   Generalized weakness   Depression, major, single episode, complete remission (HCC)   COPD exacerbation (HCC)   IDA (iron deficiency anemia)   COPD exacerbation: complicated by anxiety. Continue on bronchodilators, steroids & encourage incentive spirometry.   Anxiety: likely severe. Ativan & Hydroxyzine prn   OSA: CPAP qhs however pt does not wear it as she can not tolerate it    Hx of CAD: s/p CABG in 2015. Continue on home dose of imdur, losartan, plavix.   DM2: likely poorly controlled. Continue on SSI w/ accuchecks  Hypothyroidism: continue on home does of levothyroxine    Depression: severity unknown. Continue on home dose of dose of duloxetine, fluoxetine   Insomnia: continue on home dose of trazodone   Hyponatremia: labile   GERD: continue on PPI    DVT prophylaxis: lovenox  Code Status: full  Family Communication: discussed pt's care w/ pt's husband, Francee Piccolo, and answered his questions  Disposition Plan: depends on PT/OT recs (not consulted yet).  Level of care: Med-Surg  Status is: Observation  The patient remains OBS appropriate and will d/c before 2 midnights.  Dispo: The patient is from: Home              Anticipated d/c is to: Home              Patient currently is not medically stable to d/c.   Difficult to place patient : unclear     Consultants:    Procedures:   Antimicrobials:    Subjective: Pt c/o shortness of breath and anxiety   Objective: Vitals:   01/29/21 0430 01/29/21 0500 01/29/21 0530 01/29/21 0600  BP: (!) 120/52 (!) 141/49 (!) 130/52 (!) 126/51  Pulse: 66 64 62 67   Resp: (!) 23 (!) 24 (!) 22 (!) 24  Temp:      TempSrc:      SpO2: 99% 100% 100% 99%    Intake/Output Summary (Last 24 hours) at 01/29/2021 0817 Last data filed at 01/29/2021 0230 Gross per 24 hour  Intake --  Output 2400 ml  Net -2400 ml   There were no vitals filed for this visit.  Examination:  General exam: Appears anxious  Respiratory system: diminished breath sounds b/l  Cardiovascular system: S1 & S2 +. No rubs, gallops or clicks.  Gastrointestinal system: Abdomen is nondistended, soft and nontender. Normal bowel sounds heard. Central nervous system: Alert and oriented. Moves all extremities  Psychiatry: Judgement and insight appear normal. Anxious mood and affect    Data Reviewed: I have personally reviewed following labs and imaging studies  CBC: Recent Labs  Lab 01/28/21 1959 01/29/21 0647  WBC 9.0 8.8  NEUTROABS 6.7  --   HGB 9.9* 10.5*  HCT 29.8* 32.2*  MCV 87.9 85.9  PLT 142* [id number]   Basic Metabolic Panel: Recent Labs  Lab 01/28/21 1959 01/29/21 0647  NA 134* 132*  K 4.5 4.9  CL 98 92*  CO2 27 28  GLUCOSE 226* 394*  BUN 19 24*  CREATININE 0.92 1.05*  CALCIUM 8.8* 8.6*   GFR: CrCl cannot be calculated (Unknown ideal weight.).  Liver Function Tests: Recent Labs  Lab 01/28/21 1959  AST 39  ALT 18  ALKPHOS 90  BILITOT 0.5  PROT 6.7  ALBUMIN 3.3*   No results for input(s): LIPASE, AMYLASE in the last 168 hours. No results for input(s): AMMONIA in the last 168 hours. Coagulation Profile: No results for input(s): INR, PROTIME in the last 168 hours. Cardiac Enzymes: No results for input(s): CKTOTAL, CKMB, CKMBINDEX, TROPONINI in the last 168 hours. BNP (last 3 results) No results for input(s): PROBNP in the last 8760 hours. HbA1C: No results for input(s): HGBA1C in the last 72 hours. CBG: Recent Labs  Lab 01/28/21 2203 01/29/21 0803  GLUCAP 253* 343*   Lipid Profile: No results for input(s): CHOL, HDL, LDLCALC, TRIG, CHOLHDL,  LDLDIRECT in the last 72 hours. Thyroid Function Tests: Recent Labs    01/28/21 2125  TSH 8.993*   Anemia Panel: No results for input(s): VITAMINB12, FOLATE, FERRITIN, TIBC, IRON, RETICCTPCT in the last 72 hours. Sepsis Labs: No results for input(s): PROCALCITON, LATICACIDVEN in the last 168 hours.  Recent Results (from the past 240 hour(s))  Resp Panel by RT-PCR (Flu A&B, Covid) Nasopharyngeal Swab     Status: None   Collection Time: 01/28/21  9:25 PM   Specimen: Nasopharyngeal Swab; Nasopharyngeal(NP) swabs in vial transport medium  Result Value Ref Range Status   SARS Coronavirus 2 by RT PCR NEGATIVE NEGATIVE Final    Comment: (NOTE) SARS-CoV-2 target nucleic acids are NOT DETECTED.  The SARS-CoV-2 RNA is generally detectable in upper respiratory specimens during the acute phase of infection. The lowest concentration of SARS-CoV-2 viral copies this assay can detect is 138 copies/mL. A negative result does not preclude SARS-Cov-2 infection and should not be used as the sole basis for treatment or other patient management decisions. A negative result may occur with  improper specimen collection/handling, submission of specimen other than nasopharyngeal swab, presence of viral mutation(s) within the areas targeted by this assay, and inadequate number of viral copies(<138 copies/mL). A negative result must be combined with clinical observations, patient history, and epidemiological information. The expected result is Negative.  Fact Sheet for Patients:  EntrepreneurPulse.com.au  Fact Sheet for Healthcare Providers:  IncredibleEmployment.be  This test is no t yet approved or cleared by the [place] FDA and  has been authorized for detection and/or diagnosis of SARS-CoV-2 by FDA under an Emergency Use Authorization (EUA). This EUA will remain  in effect (meaning this test can be used) for the duration of the COVID-19 declaration under  Section 564(b)(1) of the Act, 21 U.S.C.section 360bbb-3(b)(1), unless the authorization is terminated  or revoked sooner.       Influenza A by PCR NEGATIVE NEGATIVE Final   Influenza B by PCR NEGATIVE NEGATIVE Final    Comment: (NOTE) The Xpert Xpress SARS-CoV-2/FLU/RSV plus assay is intended as an aid in the diagnosis of influenza from Nasopharyngeal swab specimens and should not be used as a sole basis for treatment. Nasal washings and aspirates are unacceptable for Xpert Xpress SARS-CoV-2/FLU/RSV testing.  Fact Sheet for Patients: EntrepreneurPulse.com.au  Fact Sheet for Healthcare Providers: IncredibleEmployment.be  This test is not yet approved or cleared by the [place] FDA and has been authorized for detection and/or diagnosis of SARS-CoV-2 by FDA under an Emergency Use Authorization (EUA). This EUA will remain in effect (meaning this test can be used) for the duration of the COVID-19 declaration under Section 564(b)(1) of the Act, 21 U.S.C. section 360bbb-3(b)(1), unless the authorization is terminated or revoked.  Performed at [facility], [address], [place], [place] [address]          Radiology Studies: [facility] [place] 1 View  Result Date: 01/28/2021 CLINICAL DATA:  Difficulty breathing.  Shortness of breath. EXAM: PORTABLE CHEST 1 VIEW COMPARISON:  November 24, 2020 FINDINGS: Stable cardiomegaly. The hila and mediastinum are normal. No pneumothorax. No nodules or masses. No focal infiltrates. Increased interstitial opacities in the lungs suggesting mild edema. IMPRESSION: Cardiomegaly and mild pulmonary edema. No other acute abnormalities. Electronically Signed   By: Dorise Bullion III M.D.   On: 01/28/2021 19:17        Scheduled Meds:  aspirin EC  81 mg Oral Daily   clopidogrel  75 mg Oral Daily   DULoxetine  20 mg Oral q AM   enoxaparin (LOVENOX) injection  40 mg Subcutaneous Q24H   FLUoxetine  40 mg  Oral BID   furosemide  60 mg Intravenous q morning   insulin aspart  0-15 Units Subcutaneous TID WC   insulin aspart  0-5 Units Subcutaneous QHS   ipratropium-albuterol  3 mL Nebulization QID   isosorbide mononitrate  30 mg Oral Daily   levothyroxine  125 mcg Oral QAC breakfast   losartan  25 mg Oral q AM   methylPREDNISolone (SOLU-MEDROL) injection  80 mg Intravenous Q24H   pantoprazole  40 mg Oral q AM   traZODone  200 mg Oral QHS   umeclidinium-vilanterol  1 puff Inhalation Daily   Continuous Infusions:   LOS: 0 days    Time spent: 31 mins     Wyvonnia Dusky, MD [facility] Pager [phone]  If 7PM-7AM, please contact night-coverage 01/29/2021, 8:17 AM

## 2021-01-29 NOTE — ED Notes (Signed)
Patient cleansed and linen changed.

## 2021-01-29 NOTE — ED Notes (Signed)
Pt placed in hospital ned for comfort,

## 2021-01-29 NOTE — ED Notes (Signed)
Pt complaint of being SOB. Respiratory called for possible CPAP placement again. MD came in to the bedside. Maybe more anxiety related than respiratory. She will order some IV anxiety meds.

## 2021-01-29 NOTE — Progress Notes (Signed)
Blood sugar 456, Dr Sidney Ace notified

## 2021-01-29 NOTE — ED Notes (Signed)
Patient resting quietly at this time.

## 2021-01-29 NOTE — ED Notes (Signed)
Resting quietly with eyes closed, no acute distress noted at this time.

## 2021-01-29 NOTE — Progress Notes (Signed)
Dr Mitchell Heir notified about patients blood sugar, orders given.

## 2021-01-29 NOTE — ED Notes (Signed)
RN to bedside to answer call bell. Pt is a mess. Bed is wet. Pt is eating [employer] food after she has had a lunch tray. Pt is not hooked to any monitor cables. Pt advised she is tired of being in the ER and wants to go home. RN tried to educate her on why she needs to be here and needs to stay. I will let MD know.

## 2021-01-30 ENCOUNTER — Observation Stay: Payer: Medicare HMO

## 2021-01-30 ENCOUNTER — Inpatient Hospital Stay: Payer: Medicare HMO

## 2021-01-30 DIAGNOSIS — J441 Chronic obstructive pulmonary disease with (acute) exacerbation: Secondary | ICD-10-CM | POA: Diagnosis present

## 2021-01-30 DIAGNOSIS — I110 Hypertensive heart disease with heart failure: Secondary | ICD-10-CM | POA: Diagnosis present

## 2021-01-30 DIAGNOSIS — E7800 Pure hypercholesterolemia, unspecified: Secondary | ICD-10-CM | POA: Diagnosis present

## 2021-01-30 DIAGNOSIS — K219 Gastro-esophageal reflux disease without esophagitis: Secondary | ICD-10-CM | POA: Diagnosis present

## 2021-01-30 DIAGNOSIS — F325 Major depressive disorder, single episode, in full remission: Secondary | ICD-10-CM | POA: Diagnosis present

## 2021-01-30 DIAGNOSIS — Z953 Presence of xenogenic heart valve: Secondary | ICD-10-CM | POA: Diagnosis not present

## 2021-01-30 DIAGNOSIS — Z951 Presence of aortocoronary bypass graft: Secondary | ICD-10-CM | POA: Diagnosis not present

## 2021-01-30 DIAGNOSIS — I272 Pulmonary hypertension, unspecified: Secondary | ICD-10-CM | POA: Diagnosis present

## 2021-01-30 DIAGNOSIS — Z803 Family history of malignant neoplasm of breast: Secondary | ICD-10-CM | POA: Diagnosis not present

## 2021-01-30 DIAGNOSIS — G4700 Insomnia, unspecified: Secondary | ICD-10-CM | POA: Diagnosis present

## 2021-01-30 DIAGNOSIS — J810 Acute pulmonary edema: Secondary | ICD-10-CM | POA: Diagnosis present

## 2021-01-30 DIAGNOSIS — F419 Anxiety disorder, unspecified: Secondary | ICD-10-CM | POA: Diagnosis not present

## 2021-01-30 DIAGNOSIS — Z90710 Acquired absence of both cervix and uterus: Secondary | ICD-10-CM | POA: Diagnosis not present

## 2021-01-30 DIAGNOSIS — Z20822 Contact with and (suspected) exposure to covid-19: Secondary | ICD-10-CM | POA: Diagnosis present

## 2021-01-30 DIAGNOSIS — Z87891 Personal history of nicotine dependence: Secondary | ICD-10-CM | POA: Diagnosis not present

## 2021-01-30 DIAGNOSIS — E1165 Type 2 diabetes mellitus with hyperglycemia: Secondary | ICD-10-CM | POA: Diagnosis present

## 2021-01-30 DIAGNOSIS — I5032 Chronic diastolic (congestive) heart failure: Secondary | ICD-10-CM | POA: Diagnosis present

## 2021-01-30 DIAGNOSIS — J9621 Acute and chronic respiratory failure with hypoxia: Secondary | ICD-10-CM | POA: Diagnosis not present

## 2021-01-30 DIAGNOSIS — F410 Panic disorder [episodic paroxysmal anxiety] without agoraphobia: Secondary | ICD-10-CM | POA: Diagnosis present

## 2021-01-30 DIAGNOSIS — E1151 Type 2 diabetes mellitus with diabetic peripheral angiopathy without gangrene: Secondary | ICD-10-CM | POA: Diagnosis present

## 2021-01-30 DIAGNOSIS — E871 Hypo-osmolality and hyponatremia: Secondary | ICD-10-CM

## 2021-01-30 DIAGNOSIS — D509 Iron deficiency anemia, unspecified: Secondary | ICD-10-CM | POA: Diagnosis present

## 2021-01-30 DIAGNOSIS — G4733 Obstructive sleep apnea (adult) (pediatric): Secondary | ICD-10-CM | POA: Diagnosis present

## 2021-01-30 DIAGNOSIS — E890 Postprocedural hypothyroidism: Secondary | ICD-10-CM | POA: Diagnosis present

## 2021-01-30 DIAGNOSIS — I2510 Atherosclerotic heart disease of native coronary artery without angina pectoris: Secondary | ICD-10-CM | POA: Diagnosis present

## 2021-01-30 DIAGNOSIS — Z9049 Acquired absence of other specified parts of digestive tract: Secondary | ICD-10-CM | POA: Diagnosis not present

## 2021-01-30 LAB — GLUCOSE, CAPILLARY
Glucose-Capillary: 336 mg/dL — ABNORMAL HIGH (ref 70–99)
Glucose-Capillary: 350 mg/dL — ABNORMAL HIGH (ref 70–99)
Glucose-Capillary: 396 mg/dL — ABNORMAL HIGH (ref 70–99)
Glucose-Capillary: 455 mg/dL — ABNORMAL HIGH (ref 70–99)
Glucose-Capillary: 469 mg/dL — ABNORMAL HIGH (ref 70–99)
Glucose-Capillary: 554 mg/dL (ref 70–99)
Glucose-Capillary: 513 mg/dL (ref 70–99)
Glucose-Capillary: 533 mg/dL (ref 70–99)

## 2021-01-30 LAB — GLUCOSE, RANDOM: Glucose, Bld: 546 mg/dL (ref 70–99)

## 2021-01-30 LAB — CBC
HCT: 29.5 % — ABNORMAL LOW (ref 36.0–46.0)
Hemoglobin: 10 g/dL — ABNORMAL LOW (ref 12.0–15.0)
MCH: 29.2 pg (ref 26.0–34.0)
MCHC: 33.9 g/dL (ref 30.0–36.0)
MCV: 86.3 fL (ref 80.0–100.0)
Platelets: 166 10*3/uL (ref 150–400)
RBC: 3.42 MIL/uL — ABNORMAL LOW (ref 3.87–5.11)
RDW: 13.5 % (ref 11.5–15.5)
WBC: 11.2 10*3/uL — ABNORMAL HIGH (ref 4.0–10.5)
nRBC: 0 % (ref 0.0–0.2)

## 2021-01-30 LAB — BASIC METABOLIC PANEL
Anion gap: 8 (ref 5–15)
BUN: 34 mg/dL — ABNORMAL HIGH (ref 8–23)
CO2: 32 mmol/L (ref 22–32)
Calcium: 8.4 mg/dL — ABNORMAL LOW (ref 8.9–10.3)
Chloride: 91 mmol/L — ABNORMAL LOW (ref 98–111)
Creatinine, Ser: 1.15 mg/dL — ABNORMAL HIGH (ref 0.44–1.00)
GFR, Estimated: 49 mL/min — ABNORMAL LOW (ref >60)
Glucose, Bld: 254 mg/dL — ABNORMAL HIGH (ref 70–99)
Potassium: 3.7 mmol/L (ref 3.5–5.1)
Sodium: 131 mmol/L — ABNORMAL LOW (ref 135–145)

## 2021-01-30 LAB — HEMOGLOBIN A1C
Hgb A1c MFr Bld: 8.6 % — ABNORMAL HIGH (ref 4.8–5.6)
Mean Plasma Glucose: 200.12 mg/dL

## 2021-01-30 MED ORDER — INSULIN ASPART 100 UNIT/ML IJ SOLN
20.0000 [IU] | Freq: Once | INTRAMUSCULAR | Status: AC
  Administered 2021-01-30: 20 [IU] via SUBCUTANEOUS
  Filled 2021-01-30: qty 1

## 2021-01-30 MED ORDER — FUROSEMIDE 10 MG/ML IJ SOLN: 40.0000 mg | Freq: Two times a day (BID) | INTRAMUSCULAR | Status: DC

## 2021-01-30 MED ORDER — FUROSEMIDE 10 MG/ML IJ SOLN
40.0000 mg | Freq: Every day | INTRAMUSCULAR | Status: DC
  Administered 2021-01-30 – 2021-01-31 (×2): 40 mg via INTRAVENOUS
  Filled 2021-01-30 (×2): qty 4

## 2021-01-30 MED ORDER — HYDROCODONE-ACETAMINOPHEN 5-325 MG PO TABS
1.0000 | ORAL_TABLET | Freq: Four times a day (QID) | ORAL | Status: DC | PRN
  Administered 2021-01-30: 1 via ORAL
  Filled 2021-01-30: qty 1

## 2021-01-30 MED ORDER — TECHNETIUM TO 99M ALBUMIN AGGREGATED
4.0000 | Freq: Once | INTRAVENOUS | Status: AC
  Administered 2021-01-30: 4 via INTRAVENOUS

## 2021-01-30 MED ORDER — PREDNISONE 20 MG PO TABS
40.0000 mg | ORAL_TABLET | Freq: Every day | ORAL | Status: DC
  Administered 2021-01-30 – 2021-01-31 (×2): 40 mg via ORAL
  Filled 2021-01-30 (×2): qty 2

## 2021-01-30 MED ORDER — INSULIN ASPART 100 UNIT/ML IJ SOLN
15.0000 [IU] | Freq: Once | INTRAMUSCULAR | Status: AC
  Administered 2021-01-30: 15 [IU] via SUBCUTANEOUS
  Filled 2021-01-30: qty 1

## 2021-01-30 NOTE — Progress Notes (Signed)
Inpatient Diabetes Program Recommendations  AACE/ADA: New Consensus Statement on Inpatient Glycemic Control (2015)  Target Ranges:  Prepandial:   less than 140 mg/dL      Peak postprandial:   less than 180 mg/dL (1-2 hours)      Critically ill patients:  140 - 180 mg/dL  Results for [name], [name] (MRN [id number]) as of 01/30/2021 07:21  Ref. Range 01/29/2021 08:03 01/29/2021 13:06 01/29/2021 18:02 01/29/2021 20:59 01/29/2021 21:01 01/29/2021 23:16 01/30/2021 02:16  Glucose-Capillary Latest Ref Range: 70 - 99 mg/dL  **125 mg Solumedrol '@1921'$  08/27 343 (H)  11 units Novolog '@0936'$   **80 mg Solumedrol '@0931'$  454 (H)  15 units Novolog   10 units [place] '@1455'$  455 (H) 477 (H) 516 (HH)  20 units Novolog '@2204'$  456 (H)  20 units Novolog '@0043'$   20 units Semglee '@0044'$  336 (H)  Results for [name], [name] (MRN [id number]) as of 01/30/2021 10:25  Ref. Range 01/30/2021 08:24  Glucose-Capillary Latest Ref Range: 70 - 99 mg/dL 350 (H)  11 units Novolog  10 units Semglee  Results for [name], [name] (MRN [id number]) as of 01/30/2021 10:25  Ref. Range 01/30/2021 03:55  Hemoglobin A1C Latest Ref Range: 4.8 - 5.6 % 8.6 (H)    Admit with: SOB  History: DM  Home DM Meds: 70/30 Insulin 60 units BID        Glipizide 10 mg Daily  Current Orders: Novolog Moderate Correction Scale/ SSI (0-15 units) TID AC + HS      Semglee 10 units Daily     Note patient received Solumedrol PM on 08/27 and again on AM 08/28  Starting Prednisone 40 mg daily today    MD- Note patient takes large doses of 70/30 Insulin at home.  Received a total of 30 units Semglee yesterday--Only scheduled to get 10 units Semglee this AM  CBG 350 this AM  Please consider:  1. Increase Semglee to 30 units Daily (0.35 units/kg) Gets 84 units basal insulin in her 2 doses of 70/30 Insulin at home)  2. Start Novolog Meal Coverage: Novolog 6 units TID with meals Hold if pt eats <50% of meal, Hold if pt NPO   --Will follow  patient during hospitalization--  Wyn Quaker RN, MSN, CDE Diabetes Coordinator Inpatient Glycemic Control Team Team Pager: [phone] (8a-5p)

## 2021-01-30 NOTE — Evaluation (Signed)
Physical Therapy Evaluation Patient Details Name: [name] MRN: [id number] DOB: Oct 18, 1945 Today's Date: 01/30/2021   History of Present Illness  Pt is a 75 y.o. female presenting to hospital 8/27 with acute worsening of SOB.  Pt admitted with COPD exacerbation complicated by anxiety.  PMH includes dCHF, COPD, DM, chronic respiratory failure on 3 L home O2, anemia, anxiety, HOH, OSA, NSTEMI, dizziness, AVR, and CABG.  Clinical Impression  Prior to hospital admission, pt was independent with ambulation; uses 3 L home O2 chronic; and lives with her husband in 1 level home.  Currently pt is modified independent semi-supine to sitting edge of bed; CGA with transfers; and CGA ambulating 20 feet x2 (to bathroom and back).  Limited distance ambulating d/t SOB (O2 sats 94% or greater on 2 L O2 via nasal cannula during sessions activities).  Pt holding onto furniture intermittently for balance when walking in room (anticipate pt would benefit from RW use--discussed with pt who reports she has used a RW before).  Pt would benefit from skilled PT to address noted impairments and functional limitations (see below for any additional details).  Upon hospital discharge, pt would benefit from [place].    Follow Up Recommendations Home health PT    Equipment Recommendations  Rolling walker with 5" wheels    Recommendations for Other Services OT consult     Precautions / Restrictions Precautions Precautions: Fall Restrictions Weight Bearing Restrictions: No      Mobility  Bed Mobility Overal bed mobility: Modified Independent             General bed mobility comments: HOB elevated; mild increased effort to perform on own    Transfers Overall transfer level: Needs assistance Equipment used: None Transfers: Sit to/from [employer] Transfers Sit to Stand: Min guard (x1 trial from bed, x1 trial from recliner, x1 trial from toilet; single UE support on furniture for balance when  transferring) Stand pivot transfers: Min guard (stand step turn bed to recliner)       General transfer comment: mild increased effort to stand; pt holding onto furniture with single UE support for balance  Ambulation/Gait Ambulation/Gait assistance: Min guard Gait Distance (Feet):  (20 feet x2 (to/from bathroom)) Assistive device: Rolling walker (2 wheeled)   Gait velocity: decreased   General Gait Details: pt intermittently holding onto furniture for balance when ambulating  Stairs            Wheelchair Mobility    Modified Rankin (Stroke Patients Only)       Balance Overall balance assessment: Needs assistance Sitting-balance support: No upper extremity supported;Feet supported Sitting balance-Leahy Scale: Good Sitting balance - Comments: steady sitting reaching within BOS   Standing balance support: No upper extremity supported Standing balance-Leahy Scale: Good Standing balance comment: steady standing washing hands at sink                             Pertinent Vitals/Pain Pain Assessment: No/denies pain HR WFL during sessions activities.    Home Living Family/patient expects to be discharged to:: Private residence Living Arrangements: Spouse/significant other Available Help at Discharge: Family;Other (Comment) (pt reports her husband has a LE amputation and uses a w/c) Type of Home: House Home Access: Stairs to enter Entrance Stairs-Rails: Left Entrance Stairs-Number of Steps: 5 Home Layout: One level Home Equipment: Cane - single point      Prior Function Level of Independence: Independent  Comments: Pt reports no recent falls.     Hand Dominance        Extremity/Trunk Assessment   Upper Extremity Assessment Upper Extremity Assessment: Generalized weakness    Lower Extremity Assessment Lower Extremity Assessment: Generalized weakness    Cervical / Trunk Assessment Cervical / Trunk Assessment: Other  exceptions Cervical / Trunk Exceptions: forward head/shoulders  Communication   Communication: HOH  Cognition Arousal/Alertness: Awake/alert Behavior During Therapy: Anxious Overall Cognitive Status: Within Functional Limits for tasks assessed                                        General Comments  Pt agreeable to PT session.    Exercises     Assessment/Plan    PT Assessment Patient needs continued PT services  PT Problem List Decreased strength;Decreased activity tolerance;Decreased balance;Decreased mobility;Decreased knowledge of use of DME;Cardiopulmonary status limiting activity       PT Treatment Interventions DME instruction;Gait training;Stair training;Functional mobility training;Therapeutic activities;Therapeutic exercise;Balance training;Patient/family education    PT Goals (Current goals can be found in the Care Plan section)  Acute Rehab PT Goals Patient Stated Goal: to improve breathing PT Goal Formulation: With patient Time For Goal Achievement: 02/13/21 Potential to Achieve Goals: Good    Frequency Min 2X/week   Barriers to discharge        Co-evaluation               AM-PAC PT "6 Clicks" Mobility  Outcome Measure Help needed turning from your back to your side while in a flat bed without using bedrails?: None Help needed moving from lying on your back to sitting on the side of a flat bed without using bedrails?: None Help needed moving to and from a bed to a chair (including a wheelchair)?: A Little Help needed standing up from a chair using your arms (e.g., wheelchair or bedside chair)?: A Little Help needed to walk in hospital room?: A Little Help needed climbing 3-5 steps with a railing? : A Little 6 Click Score: 20    End of Session Equipment Utilized During Treatment: Gait belt;Oxygen (2 L O2 via nasal cannula) Activity Tolerance: Other (comment) (limited by SOB) Patient left: in chair;with call bell/phone within  reach;with chair alarm set Nurse Communication: Mobility status;Precautions PT Visit Diagnosis: Other abnormalities of gait and mobility (R26.89);Muscle weakness (generalized) (M62.81);Unsteadiness on feet (R26.81)    Time: [id number] PT Time Calculation (min) (ACUTE ONLY): 23 min   Charges:   PT Evaluation $PT Eval Low Complexity: 1 Low PT Treatments $Therapeutic Activity: 8-22 mins       Leitha Bleak, PT 01/30/21, 11:29 AM

## 2021-01-30 NOTE — TOC Initial Note (Signed)
Transition of Care [facility]) - Initial/Assessment Note    Patient Details  Name: [name] MRN: [id number] Date of Birth: [birth year]  Transition of Care [facility]) CM/SW Contact:    Beverly Sessions, RN Phone Number: 01/30/2021, 12:14 PM  Clinical Narrative:                   Admitted for: COPD Admitted from: Home [id number] Pharmacy:[facility] Current home health/prior home health/DME: [facility]  Attempted to review obs letter with patient, and recommendation for home health services. Patient declines home health services and declines to discuss anything else at this time.  TOC to follow up at later time   Expected Discharge Plan: Home/Self Care Barriers to Discharge: Continued Medical Work up   Patient Goals and CMS Choice        Expected Discharge Plan and Services Expected Discharge Plan: Home/Self Care       Living arrangements for the past 2 months: Single Family Home                                      Prior Living Arrangements/Services Living arrangements for the past 2 months: Single Family Home Lives with:: Spouse Patient language and need for interpreter reviewed:: Yes Do you feel safe going back to the place where you live?: Yes      Need for Family Participation in Patient Care: Yes (Comment) Care giver support system in place?: Yes (comment) Current home services: DME Criminal Activity/Legal Involvement Pertinent to Current Situation/Hospitalization: No - Comment as needed  Activities of Daily Living Home Assistive Devices/Equipment: Dentures (specify type), Eyeglasses, Oxygen ADL Screening (condition at time of admission) Patient's cognitive ability adequate to safely complete daily activities?: Yes Is the patient deaf or have difficulty hearing?: Yes Does the patient have difficulty seeing, even when wearing glasses/contacts?: No Does the patient have difficulty concentrating, remembering, or making decisions?: Yes Patient able to express need for  assistance with ADLs?: Yes Does the patient have difficulty dressing or bathing?: No Independently performs ADLs?: Yes (appropriate for developmental age) Does the patient have difficulty walking or climbing stairs?: Yes Weakness of Legs: Both Weakness of Arms/Hands: None  Permission Sought/Granted                  Emotional Assessment       Orientation: : Oriented to Self, Oriented to Place, Oriented to  Time, Oriented to Situation Alcohol / Substance Use: Not Applicable Psych Involvement: No (comment)  Admission diagnosis:  Acute pulmonary edema (HCC) [J81.0] SOB (shortness of breath) [R06.02] COPD exacerbation (HCC) [J44.1] Acute on chronic congestive heart failure, unspecified heart failure type [facility]) [I50.9] Patient Active Problem List   Diagnosis Date Noted   Acute on chronic diastolic (congestive) heart failure ([place]) 11/24/2020   SOB (shortness of breath) 11/24/2020   Acute hyponatremia 11/24/2020   Prolonged QT interval 11/24/2020   Angina pectoris ([place]) 11/03/2020   CHF exacerbation ([place]) 09/07/2020   Elevated troponin 09/07/2020   Chest pain 08/09/2020   Acute on chronic diastolic CHF (congestive heart failure) ([place]) 08/08/2020   Anemia due to stage 3a chronic kidney disease ([place]) 05/21/2019   IDA (iron deficiency anemia) 02/03/2019   COPD exacerbation ([place]) 12/28/2017   Depression, major, single episode, complete remission ([place]) 04/11/2017   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   Aortic ectasia, abdominal ([place]) 01/01/2017   GAD (generalized anxiety disorder) 09/26/2016   Acute posthemorrhagic  anemia 08/24/2016   Acute respiratory failure with hypoxia (HCC) [id number]   Acute diastolic CHF (congestive heart failure) ([place]) 08/24/2016   Hyponatremia 08/24/2016   Leukocytosis 08/24/2016   Fever 08/24/2016   Diarrhea 08/24/2016   Generalized weakness 08/24/2016   Acute diverticulitis 08/16/2016   Gastrointestinal hemorrhage    Ataxia 08/09/2016    Dizziness 08/09/2016   Staggering gait 08/09/2016   NSTEMI (non-ST elevated myocardial infarction) [facility])    AVB (atrioventricular block)    Atypical chest pain    Unstable angina ([place]) 05/25/2016   Palpitations 05/25/2016   PUD - recent Rx for H.Pylori 05/25/2016   Stenosis of coronary artery stent    Recurrent major depressive disorder, in partial remission ([place]) 07/29/2015   Sialadenitis 04/25/2015   Viral URI 03/24/2015   Hypoglycemia 08/25/2014   Nausea without vomiting 08/25/2014   Dyslipidemia-statin ontol    Long-term insulin use ([place]) 03/31/2014   Aortic valve disorder 11/11/2013   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 10/12/2013   Coronary Artery Disease s/p CABG 10/2013 10/10/2013   Other malaise and fatigue 09/11/2013   Alopecia 03/06/2013   Insomnia 02/06/2013   Gastroesophageal reflux disease without esophagitis 01/19/2013   Dysphagia, unspecified(787.20) 01/19/2013   Dyspnea 11/17/2012   Weakness 11/17/2012   Abnormal MRI of head 09/05/2012   Depression with anxiety 03/21/2012   Extrinsic asthma 11/12/2011   Anemia 06/07/2011   Chronic obstructive pulmonary disease ([place]) 01/25/2010   Acquired hypothyroidism 08/10/2009   MICROSCOPIC HEMATURIA 05/07/2009   GOITER 03/02/2009   Essential hypertension 01/06/2009   MEMORY LOSS 06/23/2008   Obstructive sleep apnea 12/25/2007   DM type 2 (diabetes mellitus, type 2) ([place]) 09/09/2006   ALLERGIC RHINITIS 09/09/2006   DIVERTICULOSIS, COLON 09/09/2006   PCP:  Rusty Aus, MD Pharmacy:   [facility] [address] [place] [address] Phone: [phone] Fax: [phone]     Social Determinants of Health (SDOH) Interventions    Readmission Risk Interventions Readmission Risk Prevention Plan 09/08/2020  Transportation Screening Complete  PCP or Specialist Appt within 3-5 Days Complete  HRI or Home Care Consult Complete  Palliative Care Screening Not Applicable  Medication  Review (RN Care Manager) Complete  Some recent data might be hidden

## 2021-01-30 NOTE — Progress Notes (Signed)
   01/30/21 0851  Assess: MEWS Score  Temp 97.7 F (36.5 C)  BP (!) 108/47  Pulse Rate 82  Resp (!) 26  SpO2 100 %  O2 Device Nasal Cannula  Assess: MEWS Score  MEWS Temp 0  MEWS Systolic 0  MEWS Pulse 0  MEWS RR 2  MEWS LOC 0  MEWS Score 2  MEWS Score Color Yellow  Assess: if the MEWS score is Yellow or Red  Were vital signs taken at a resting state? Yes  Focused Assessment Change from prior assessment (see assessment flowsheet)  Does the patient meet 2 or more of the SIRS criteria? No  MEWS guidelines implemented *See Row Information* No, previously yellow, continue vital signs every 4 hours  Treat  MEWS Interventions Consulted Respiratory Therapy;Other (Comment) (Breathing treatments & inhaler given)  Pain Scale 0-10  Pain Score 0  Take Vital Signs  Increase Vital Sign Frequency  Yellow: Q 2hr X 2 then Q 4hr X 2, if remains yellow, continue Q 4hrs  Escalate  MEWS: Escalate Yellow: discuss with charge nurse/RN and consider discussing with provider and RRT  Notify: Charge Nurse/RN  Name of Charge Nurse/RN Notified Ronalee Belts, RN  Date Charge Nurse/RN Notified 01/30/21  Time Charge Nurse/RN Notified [phone]  Notify: Provider  Provider Name/Title Dr. Jimmye Norman  Date Provider Notified 01/30/21  Time Provider Notified [phone]  Notification Type Page  Notification Reason Change in status  Provider response No new orders  Date of Provider Response 01/30/21  Time of Provider Response [phone]  Document  Patient Outcome Stabilized after interventions  Progress note created (see row info) Yes  Assess: SIRS CRITERIA  SIRS Temperature  0  SIRS Pulse 0  SIRS Respirations  1  SIRS WBC 0  SIRS Score Sum  1

## 2021-01-30 NOTE — Progress Notes (Signed)
PROGRESS NOTE    [name]  [id number] DOB: 1946/01/23 DOA: 01/28/2021 PCP: Rusty Aus, MD   Assessment & Plan:   Active Problems:   Obstructive sleep apnea   Essential hypertension   Chronic obstructive pulmonary disease (HCC)   Anemia   Depression with anxiety   Coronary Artery Disease s/p CABG 10/2013   Stenosis of coronary artery stent   Generalized weakness   Depression, major, single episode, complete remission (HCC)   COPD exacerbation (HCC)   IDA (iron deficiency anemia)   COPD exacerbation: complicated by anxiety. Continue on bronchodilators, steroids and encourage incentive spirometry. Pulmon consulted   Anxiety: likely severe. Ativan, hydroxyzine prn    OSA: CPAP qhs however pt does not wear it as she can not tolerate it    Hx of CAD: s/p CABG in 2015. Continue on home dose of imdur, losartan, plavix.   DM2: poorly controlled. Continue on SSI w/ accuchecks   Hypothyroidism: continue on home dose of levothyroxine     Depression: severity unknown. Continue on home dose of dose of duloxetine, fluoxetine   Insomnia: continue on home dose of trazodone   Hyponatremia: labile. Will continue to monitor   GERD: continue on PPI    DVT prophylaxis: lovenox  Code Status: full  Family Communication: discussed pt's care w/ pt's husband, Francee Piccolo, and answered his questions  Disposition Plan: depends on PT/OT recs (not consulted yet).  Level of care: Med-Surg  Status is: Observation  The patient remains OBS appropriate and will d/c before 2 midnights.  Dispo: The patient is from: Home              Anticipated d/c is to: Home              Patient currently is not medically stable to d/c.   Difficult to place patient : unclear     Consultants:    Procedures:   Antimicrobials:    Subjective: Pt c/o shortness of breath    Objective: Vitals:   01/29/21 2009 01/30/21 0402 01/30/21 0735 01/30/21 0851  BP:  (!) 119/55 (!) 106/40 (!) 108/47   Pulse:  71 75 82  Resp:  18 20 (!) 26  Temp:  98.4 F (36.9 C) (!) 97.4 F (36.3 C) 97.7 F (36.5 C)  TempSrc:  Oral Oral Oral  SpO2: 98% 98% 100% 100%    Intake/Output Summary (Last 24 hours) at 01/30/2021 [id number] Last data filed at 01/30/2021 0115 Gross per 24 hour  Intake --  Output 1900 ml  Net -1900 ml   There were no vitals filed for this visit.  Examination:  General exam: Appears anxious  Respiratory system: decreased breath sounds b/l otherwise clear Cardiovascular system: S1/S2+. No rubs or clicks Gastrointestinal system: Abd is soft, NT, obese & hypoactive bowel sounds  Central nervous system: Alert and oriented. Moves all extremities  Psychiatry: Judgement and insight appears poor. Flat mood and affect    Data Reviewed: I have personally reviewed following labs and imaging studies  CBC: Recent Labs  Lab 01/28/21 1959 01/29/21 0647 01/30/21 0355  WBC 9.0 8.8 11.2*  NEUTROABS 6.7  --   --   HGB 9.9* 10.5* 10.0*  HCT 29.8* 32.2* 29.5*  MCV 87.9 85.9 86.3  PLT 142* 167 [id number]   Basic Metabolic Panel: Recent Labs  Lab 01/28/21 1959 01/29/21 0647 01/30/21 0355  NA 134* 132* 131*  K 4.5 4.9 3.7  CL 98 92* 91*  CO2 27 28 32  GLUCOSE  226* 394* 254*  BUN 19 24* 34*  CREATININE 0.92 1.05* 1.15*  CALCIUM 8.8* 8.6* 8.4*   GFR: CrCl cannot be calculated (Unknown ideal weight.). Liver Function Tests: Recent Labs  Lab 01/28/21 1959  AST 39  ALT 18  ALKPHOS 90  BILITOT 0.5  PROT 6.7  ALBUMIN 3.3*   No results for input(s): LIPASE, AMYLASE in the last 168 hours. No results for input(s): AMMONIA in the last 168 hours. Coagulation Profile: No results for input(s): INR, PROTIME in the last 168 hours. Cardiac Enzymes: No results for input(s): CKTOTAL, CKMB, CKMBINDEX, TROPONINI in the last 168 hours. BNP (last 3 results) No results for input(s): PROBNP in the last 8760 hours. HbA1C: No results for input(s): HGBA1C in the last 72 hours. CBG: Recent  Labs  Lab 01/29/21 2059 01/29/21 2101 01/29/21 2316 01/30/21 0216 01/30/21 0824  GLUCAP 477* 516* 456* 336* 350*   Lipid Profile: No results for input(s): CHOL, HDL, LDLCALC, TRIG, CHOLHDL, LDLDIRECT in the last 72 hours. Thyroid Function Tests: Recent Labs    01/28/21 2125  TSH 8.993*   Anemia Panel: No results for input(s): VITAMINB12, FOLATE, FERRITIN, TIBC, IRON, RETICCTPCT in the last 72 hours. Sepsis Labs: No results for input(s): PROCALCITON, LATICACIDVEN in the last 168 hours.  Recent Results (from the past 240 hour(s))  Resp Panel by RT-PCR (Flu A&B, Covid) Nasopharyngeal Swab     Status: None   Collection Time: 01/28/21  9:25 PM   Specimen: Nasopharyngeal Swab; Nasopharyngeal(NP) swabs in vial transport medium  Result Value Ref Range Status   SARS Coronavirus 2 by RT PCR NEGATIVE NEGATIVE Final    Comment: (NOTE) SARS-CoV-2 target nucleic acids are NOT DETECTED.  The SARS-CoV-2 RNA is generally detectable in upper respiratory specimens during the acute phase of infection. The lowest concentration of SARS-CoV-2 viral copies this assay can detect is 138 copies/mL. A negative result does not preclude SARS-Cov-2 infection and should not be used as the sole basis for treatment or other patient management decisions. A negative result may occur with  improper specimen collection/handling, submission of specimen other than nasopharyngeal swab, presence of viral mutation(s) within the areas targeted by this assay, and inadequate number of viral copies(<138 copies/mL). A negative result must be combined with clinical observations, patient history, and epidemiological information. The expected result is Negative.  Fact Sheet for Patients:  EntrepreneurPulse.com.au  Fact Sheet for Healthcare Providers:  IncredibleEmployment.be  This test is no t yet approved or cleared by the [place] FDA and  has been authorized for detection  and/or diagnosis of SARS-CoV-2 by FDA under an Emergency Use Authorization (EUA). This EUA will remain  in effect (meaning this test can be used) for the duration of the COVID-19 declaration under Section 564(b)(1) of the Act, 21 U.S.C.section 360bbb-3(b)(1), unless the authorization is terminated  or revoked sooner.       Influenza A by PCR NEGATIVE NEGATIVE Final   Influenza B by PCR NEGATIVE NEGATIVE Final    Comment: (NOTE) The Xpert Xpress SARS-CoV-2/FLU/RSV plus assay is intended as an aid in the diagnosis of influenza from Nasopharyngeal swab specimens and should not be used as a sole basis for treatment. Nasal washings and aspirates are unacceptable for Xpert Xpress SARS-CoV-2/FLU/RSV testing.  Fact Sheet for Patients: EntrepreneurPulse.com.au  Fact Sheet for Healthcare Providers: IncredibleEmployment.be  This test is not yet approved or cleared by the [place] FDA and has been authorized for detection and/or diagnosis of SARS-CoV-2 by FDA under an Emergency Use Authorization (  EUA). This EUA will remain in effect (meaning this test can be used) for the duration of the COVID-19 declaration under Section 564(b)(1) of the Act, 21 U.S.C. section 360bbb-3(b)(1), unless the authorization is terminated or revoked.  Performed at [facility], [address], [place],  [address]          Radiology Studies: [facility] Chest [place] 1 View  Result Date: 01/28/2021 CLINICAL DATA:  Difficulty breathing.  Shortness of breath. EXAM: PORTABLE CHEST 1 VIEW COMPARISON:  November 24, 2020 FINDINGS: Stable cardiomegaly. The hila and mediastinum are normal. No pneumothorax. No nodules or masses. No focal infiltrates. Increased interstitial opacities in the lungs suggesting mild edema. IMPRESSION: Cardiomegaly and mild pulmonary edema. No other acute abnormalities. Electronically Signed   By: Dorise Bullion III M.D.   On: 01/28/2021 19:17         Scheduled Meds:  aspirin EC  81 mg Oral Daily   clopidogrel  75 mg Oral Daily   DULoxetine  20 mg Oral q AM   enoxaparin (LOVENOX) injection  40 mg Subcutaneous Q24H   FLUoxetine  40 mg Oral BID   furosemide  40 mg Intravenous Q12H   insulin aspart  0-15 Units Subcutaneous TID AC & HS   insulin glargine-yfgn  10 Units Subcutaneous Daily   ipratropium-albuterol  3 mL Nebulization QID   isosorbide mononitrate  30 mg Oral Daily   levothyroxine  125 mcg Oral QAC breakfast   losartan  25 mg Oral q AM   pantoprazole  40 mg Oral q AM   traZODone  200 mg Oral QHS   umeclidinium-vilanterol  1 puff Inhalation Daily   Continuous Infusions:   LOS: 0 days    Time spent: 30 mins     Wyvonnia Dusky, MD [facility] Pager [phone]  If 7PM-7AM, please contact night-coverage 01/30/2021, 9:04 AM

## 2021-01-30 NOTE — Progress Notes (Signed)
VAST consult received for 20G IV in [facility] for V/Q scan. VAST RN contacted Nuclear med to inquire about PIV needed for study. Nuc Med tech reported a working IV (any size/any location is needed for study). She also reported patient has an IV in place which is questionable, but that patient is enroute to nuc med at this time. VAST RN phone number given for return call if further assistance is needed.

## 2021-01-30 NOTE — Consult Note (Signed)
Pulmonary Medicine          Date: 01/30/2021,   MRN# [id number] [name] 1946/05/11     Admission                  Current    Referring physician: Dr Jimmye Norman   CHIEF COMPLAINT:   Severe Acute exacerbation of COPD   HISTORY OF PRESENT ILLNESS   75 yo with hx of CABG 2015 , depression,anxiety, hypertension, OSA, COPD, iron deficiency anemia, aortic dilatation, presents emergency department for cc of dyspnea and sob. She is chronically hypoxemic with 3L/min [place] requirement. At bedside, she is no longer tripoding and resting in ED bed and does not appear to be in acute distress. She fully participated with physical exam. She is able to tell me her full name, DOB, location of hospital, and the current calendar year.She denies recent fever, nausea, vomiting, abdominal pain, dysuria, diarrhea, hematuria, blood in her stool, syncope, lost of consciousness. She reports chest pain every time she coughs. She reports no new cough. She is unsure if she has had weight gain or lost. She denies flu like illness. She denies LE edema. PCCM consult placed for further evaluation and management.    PAST MEDICAL HISTORY   Past Medical History:  Diagnosis Date   1st degree AV block    ACE-inhibitor cough    Allergic rhinitis    Anemia    iron deficiency anemia   Anxiety    Aortic ectasia (HCC)    a. CT abd in 12/2016 incidentally noted aortic atherosclerosis and infrarenal abdominal aortic ectasia measuring as large as 2.7 cm with recommendation to repeat US in 2023.   Arthritis    Asthma    Cataract    Chronic depression    Chronic diastolic CHF (congestive heart failure) (HCC)    Chronic headache    COPD (chronic obstructive pulmonary disease) (HCC)    Coronary artery disease    a. DES to RCA and mid Cx 2009. b. CABG and bioprosthetic AVR May 2015. c. cutting balloon to prox Cx in 05/2016   Diabetes mellitus    type 2   Diverticulitis of colon    Essential hypertension     GERD (gastroesophageal reflux disease)    Hearing loss    History of blood transfusion 2013   History of prosthetic aortic valve replacement    HOH (hard of hearing)    Hypercholesterolemia    intolerance of statins and niaspan   IDA (iron deficiency anemia) 02/03/2019   Mobitz type 1 second degree AV block    OSA (obstructive sleep apnea)    mild, intolerant of cpap   PAD (peripheral artery disease) ([place])    a. atherosclerosis by CT abd 12/2016 in [place].   PONV (postoperative nausea and vomiting)    Statin intolerance    Thyroid disease      SURGICAL HISTORY   Past Surgical History:  Procedure Laterality Date   ABDOMINAL HYSTERECTOMY     ABDOMINAL HYSTERECTOMY W/ PARTIAL VAGINACTOMY     AORTIC VALVE REPLACEMENT N/A 10/12/2013   Procedure: AORTIC VALVE REPLACEMENT (AVR);  Surgeon: [name], MD;  Location: [place];  Service: Open Heart Surgery;  Laterality: N/A;   APPENDECTOMY  1964   BARTHOLIN GLAND CYST EXCISION     BLADDER SUSPENSION     BREAST BIOPSY Bilateral 09/11/2000   neg   BREAST BIOPSY Left 07/24/2010   neg   BREAST CYST EXCISION  1988   bilateral nonmalignant tumors, x3   CARDIAC CATHETERIZATION     CARDIAC CATHETERIZATION N/A 05/25/2016   Procedure: Coronary Balloon Angioplasty;  Surgeon: Leonie Man, MD;  Location: [place] [facility];  Service: Cardiovascular;  Laterality: N/A;   CARDIAC CATHETERIZATION N/A 05/25/2016   Procedure: Coronary/Graft Angiography;  Surgeon: Leonie Man, MD;  Location: [place] [facility];  Service: Cardiovascular;  Laterality: N/A;   CATARACT EXTRACTION W/ INTRAOCULAR LENS  IMPLANT, BILATERAL     CHOLECYSTECTOMY  2001   COLECTOMY     lap sigmoid   COLONOSCOPY  2014   polyps found, 2 clamped off.   CORONARY ANGIOPLASTY  10/29/2007   Prox RCA & Mid Cx.   CORONARY ARTERY BYPASS GRAFT N/A 10/12/2013   Procedure: CORONARY ARTERY BYPASS GRAFT TIMES TWO;  Surgeon: [name], MD;  Location: [facility];  Service: Open Heart Surgery;   Laterality: N/A;   CORONARY/GRAFT ANGIOGRAPHY N/A 09/20/2017   Procedure: CORONARY/GRAFT ANGIOGRAPHY;  Surgeon: Sherren Mocha, MD;  Location: [place] [facility];  Service: Cardiovascular;  Laterality: N/A;   LEFT HEART CATHETERIZATION WITH CORONARY ANGIOGRAM N/A 10/09/2013   Procedure: LEFT HEART CATHETERIZATION WITH CORONARY ANGIOGRAM;  Surgeon: [name], MD;  Location[facility];  Service: Cardiovascular;  Laterality: N/A;   RIGHT/LEFT HEART CATH AND CORONARY ANGIOGRAPHY N/A 11/03/2020   Procedure: RIGHT/LEFT HEART CATH AND CORONARY ANGIOGRAPHY;  Surgeon: Corey Skains, MD;  Location: [place] [facility];  Service: Cardiovascular;  Laterality: N/A;   STERNAL WIRES REMOVAL N/A 04/13/2014   Procedure: STERNAL WIRES REMOVAL;  Surgeon: [name], MD;  Location: [place];  Service: Thoracic;  Laterality: N/A;   TEE WITHOUT CARDIOVERSION N/A 11/03/2020   Procedure: TRANSESOPHAGEAL ECHOCARDIOGRAM (TEE);  Surgeon: Corey Skains, MD;  Location: [facility];  Service: Cardiovascular;  Laterality: N/A;   THYROIDECTOMY     TONSILLECTOMY     TUBAL LIGATION     VAGINAL DELIVERY     3   VISCERAL ARTERY INTERVENTION N/A 08/16/2016   Procedure: Visceral Artery Intervention;  Surgeon: Algernon Huxley, MD;  Location: [place] [facility];  Service: Cardiovascular;  Laterality: N/A;     FAMILY HISTORY   Family History  Problem Relation Age of Onset   Breast cancer Mother 37   Hypertension Father    Mesothelioma Father    Asthma Father    Stroke Paternal Grandfather    Heart disease Other    Breast cancer Maternal Aunt    Breast cancer Paternal Aunt      SOCIAL HISTORY   Social History   Tobacco Use   Smoking status: Former    Packs/day: 0.50    Years: 30.00    Pack years: 15.00    Types: Cigarettes    Quit date: 10/02/2013    Years since quitting: 7.3   Smokeless tobacco: Never  Vaping Use   Vaping Use: Never used  Substance Use Topics   Alcohol use: No   Drug use: No      MEDICATIONS    Home Medication:    Current Medication:  Current Facility-Administered Medications:    acetaminophen (TYLENOL) tablet 650 mg, 650 mg, Oral, Q6H PRN **OR** acetaminophen (TYLENOL) suppository 650 mg, 650 mg, Rectal, Q6H PRN, Cox, Amy N, DO   aspirin EC tablet 81 mg, 81 mg, Oral, Daily, Cox, Amy N, DO, 81 mg at 01/30/21 0835   clopidogrel (PLAVIX) tablet 75 mg, 75 mg, Oral, Daily, Cox, Amy N,  DO, 75 mg at 01/30/21 0835   DULoxetine (CYMBALTA) DR capsule 20 mg, 20 mg, Oral, q AM, Cox, Amy N, DO, 20 mg at 01/30/21 0835   enoxaparin (LOVENOX) injection 40 mg, 40 mg, Subcutaneous, Q24H, Cox, Amy N, DO, 40 mg at 01/29/21 2205   FLUoxetine (PROZAC) capsule 40 mg, 40 mg, Oral, BID, Cox, Amy N, DO, 40 mg at 01/30/21 0835   furosemide (LASIX) injection 40 mg, 40 mg, Intravenous, Daily, Wyvonnia Dusky, MD, 40 mg at 01/30/21 1042   HYDROcodone-acetaminophen (NORCO/VICODIN) 5-325 MG per tablet 1 tablet, 1 tablet, Oral, Q6H PRN, Wyvonnia Dusky, MD, 1 tablet at 01/30/21 1236   hydrOXYzine (ATARAX/VISTARIL) tablet 25 mg, 25 mg, Oral, TID PRN, Wyvonnia Dusky, MD, 25 mg at 01/30/21 1236   insulin aspart (novoLOG) injection 0-15 Units, 0-15 Units, Subcutaneous, TID AC & HS, Mansy, Jan A, MD, 15 Units at 01/30/21 1217   insulin glargine-yfgn (SEMGLEE) injection 10 Units, 10 Units, Subcutaneous, Daily, Wyvonnia Dusky, MD, 10 Units at 01/30/21 0838   ipratropium-albuterol (DUONEB) 0.5-2.5 (3) MG/3ML nebulizer solution 3 mL, 3 mL, Nebulization, QID, Cox, Amy N, DO, 3 mL at 01/30/21 1113   isosorbide mononitrate (IMDUR) 24 hr tablet 30 mg, 30 mg, Oral, Daily, Cox, Amy N, DO, 30 mg at 01/30/21 [id number]   levothyroxine (SYNTHROID) tablet 125 mcg, 125 mcg, Oral, QAC breakfast, Cox, Amy N, DO, 125 mcg at 01/30/21 [id number]   LORazepam (ATIVAN) injection 0.5 mg, 0.5 mg, Intravenous, Q8H PRN, Wyvonnia Dusky, MD, 0.5 mg at 01/30/21 [id number]   losartan (COZAAR) tablet 25 mg, 25 mg, Oral, q  AM, Cox, Amy N, DO, 25 mg at 01/29/21 1052   ondansetron (ZOFRAN) tablet 4 mg, 4 mg, Oral, Q6H PRN **OR** ondansetron (ZOFRAN) injection 4 mg, 4 mg, Intravenous, Q6H PRN, Cox, Amy N, DO   pantoprazole (PROTONIX) EC tablet 40 mg, 40 mg, Oral, q AM, Cox, Amy N, DO, 40 mg at 01/30/21 0844   predniSONE (DELTASONE) tablet 40 mg, 40 mg, Oral, Q breakfast, Jimmye Norman, Jamiese M, MD, 40 mg at 01/30/21 1042   traZODone (DESYREL) tablet 200 mg, 200 mg, Oral, QHS, Cox, Amy N, DO, 200 mg at 01/29/21 2204   umeclidinium-vilanterol (ANORO ELLIPTA) 62.5-25 MCG/INH 1 puff, 1 puff, Inhalation, Daily, Cox, Amy N, DO, 1 puff at 01/30/21 [id number]    ALLERGIES   Amitriptyline, Benadryl [diphenhydramine], Demerol [meperidine], Gabapentin, Meperidine hcl, Mirtazapine, Olanzapine, Voltaren [diclofenac sodium], Zetia [ezetimibe], Ativan [lorazepam], Atorvastatin, Budesonide-formoterol fumarate, Bupropion hcl, Caffeine, Codeine sulfate, Lisinopril, Metformin, Mometasone furoate, Morphine sulfate, Other, Oxycodone-acetaminophen, Pioglitazone, Propoxyphene n-acetaminophen, Rosuvastatin, Shellfish allergy, Suvorexant, Ticagrelor, Tramadol, Trazodone and nefazodone, Venlafaxine, Zolpidem tartrate, and Latex     REVIEW OF SYSTEMS    Review of Systems:  Gen:  Denies  fever, sweats, chills weigh loss  HEENT: Denies blurred vision, double vision, ear pain, eye pain, hearing loss, nose bleeds, sore throat Cardiac:  No dizziness, chest pain or heaviness, chest tightness,edema Resp:   Denies cough or sputum porduction, shortness of breath,wheezing, hemoptysis,  Gi: Denies swallowing difficulty, stomach pain, nausea or vomiting, diarrhea, constipation, bowel incontinence Gu:  Denies bladder incontinence, burning urine Ext:   Denies Joint pain, stiffness or swelling Skin: Denies  skin rash, easy bruising or bleeding or hives Endoc:  Denies polyuria, polydipsia , polyphagia or weight change Psych:   Denies depression, insomnia or  hallucinations   Other:  All other systems negative   VS: BP (!) 120/50 (BP Location: Right Arm)   Pulse  79   Temp 98.3 F (36.8 C) (Oral)   Resp (!) 24   SpO2 100%      PHYSICAL EXAM    GENERAL:NAD, no fevers, chills, no weakness no fatigue HEAD: Normocephalic, atraumatic.  EYES: Pupils equal, round, reactive to light. Extraocular muscles intact. No scleral icterus.  MOUTH: Moist mucosal membrane. Dentition intact. No abscess noted.  EAR, NOSE, THROAT: Clear without exudates. No external lesions.  NECK: Supple. No thyromegaly. No nodules. No JVD.  PULMONARY: Diffuse coarse rhonchi right sided +wheezes CARDIOVASCULAR: S1 and S2. Regular rate and rhythm. No murmurs, rubs, or gallops. No edema. Pedal pulses 2+ bilaterally.  GASTROINTESTINAL: Soft, nontender, nondistended. No masses. Positive bowel sounds. No hepatosplenomegaly.  MUSCULOSKELETAL: No swelling, clubbing, or edema. Range of motion full in all extremities.  NEUROLOGIC: Cranial nerves II through XII are intact. No gross focal neurological deficits. Sensation intact. Reflexes intact.  SKIN: No ulceration, lesions, rashes, or cyanosis. Skin warm and dry. Turgor intact.  PSYCHIATRIC: Mood, affect within normal limits. The patient is awake, alert and oriented x 3. Insight, judgment intact.       IMAGING    DG Chest Port 1 View  Result Date: 01/28/2021 CLINICAL DATA:  Difficulty breathing.  Shortness of breath. EXAM: PORTABLE CHEST 1 VIEW COMPARISON:  November 24, 2020 FINDINGS: Stable cardiomegaly. The hila and mediastinum are normal. No pneumothorax. No nodules or masses. No focal infiltrates. Increased interstitial opacities in the lungs suggesting mild edema. IMPRESSION: Cardiomegaly and mild pulmonary edema. No other acute abnormalities. Electronically Signed   By: Dorise Bullion III M.D.   On: 01/28/2021 19:17      ASSESSMENT/PLAN   Acute on chronic hypoxemic respiratory faiulre  -patient has severe multi vessel  CAD based on cardiac cath few months ago  - acutely there is evidence of pulmonary edema.  -she had episode of respiratory distress and I spoke to RN who takes care of her who explained this was panic attack  -patient does have moderate pulmonary hypertension on RHC -ddimer was elevated on this admission  -id like to r/o acute venous thromboembolism with VQ scan due to mild renal impairment -will consider sildenafil TID for pulmonary hypertension once more diruresed -vital were rechecked during my examination and were stable  Moderate pulmonary hypertension   - sildenafil 20 TID post diuresis  Acute pulmonary edema   -recheck TTE   Acute exacerbation of COPD with chronic hypoxemia   - continue prednisone at '40mg'$  with taper by '10mg'$  daily       Thank you for allowing me to participate in the care of this patient.  Total face to face encounter time for this patient visit was 45 min. >50% of the time was  spent in counseling and coordination of care.   Patient/Family are satisfied with care plan and all questions have been answered.  This document was prepared using Dragon voice recognition software and may include unintentional dictation errors.     Ottie Glazier, M.D.  Division of [place]

## 2021-01-30 NOTE — Evaluation (Signed)
Occupational Therapy Evaluation Patient Details Name: [name] MRN: [id number] DOB: Oct 16, 1945 Today's Date: 01/30/2021    History of Present Illness Pt is a 75 y.o. female presenting to hospital 8/27 with acute worsening of SOB.  Pt admitted with COPD exacerbation complicated by anxiety.  PMH includes dCHF, COPD, DM, chronic respiratory failure on 3 L home O2, anemia, anxiety, HOH, OSA, NSTEMI, dizziness, AVR, and CABG.   Clinical Impression   Pt seen for OT evaluation this date. Upon arrival to room, pt seated upright in chair requesting assistance to return to bed. Pt educated on role of OT and importance of sitting upright in chair to promote optimal postioning for breathing. Pt verbalized understanding and was agreeable to OT eval. Prior to admission, pt was independent in all ADLs and functional mobility of short household distances (~137f), living in a 1-story home with husband. Pt on 2-3 liters of O2 at home. Pt was able to prepare simple meals at baseline, however children assisted with other IADLs.  Pt currently presents with decreased activity tolerance and balance, and requires SUPERVISION/SET-UP for sit>stand LB dressing, SUPERVISION/SET-UP for seated grooming tasks, and SUPERVISION for functional mobility of short household distances (~244f with RW. Pt endorses feeling weaker than usual and would benefit from additional skilled OT services to maximize return to PLOF and minimize risk of future falls, injury, caregiver burden, and readmission. Upon discharge, recommend [facility].        Follow Up Recommendations  Home health OT;Supervision - Intermittent    Equipment Recommendations  3 in 1 bedside commode       Precautions / Restrictions Precautions Precautions: Fall Restrictions Weight Bearing Restrictions: No      Mobility Bed Mobility Overal bed mobility: Modified Independent             General bed mobility comments: not assessed, pt in reclinner at  beginning/end of session    Transfers Overall transfer level: Needs assistance Equipment used: Rolling walker (2 wheeled) Transfers: Sit to/from Stand Sit to Stand: Supervision Stand pivot transfers: Min guard (stand step turn bed to recliner)       General transfer comment: With UE for support upon standing, pt able to perform x2 sit<>stand trials with supervision only    Balance Overall balance assessment: Needs assistance Sitting-balance support: No upper extremity supported;Feet supported Sitting balance-Leahy Scale: Good Sitting balance - Comments: steady sitting reaching within BOS   Standing balance support: Bilateral upper extremity supported;During functional activity Standing balance-Leahy Scale: Good Standing balance comment: SUPERVISION for walking short household distances (2079fwith RW                           ADL either performed or assessed with clinical judgement   ADL Overall ADL's : Needs assistance/impaired     Grooming: Wash/dry face;Supervision/safety;Set up;Sitting               Lower Body Dressing: Supervision/safety;Set up;Sit to/from stand Lower Body Dressing Details (indicate cue type and reason): to don/doff underwear and socks             Functional mobility during ADLs: Supervision/safety;Rolling walker (walk ~49f41f      Pertinent Vitals/Pain Pain Assessment: No/denies pain        Extremity/Trunk Assessment Upper Extremity Assessment Upper Extremity Assessment: Generalized weakness   Lower Extremity Assessment Lower Extremity Assessment: Generalized weakness     Communication Communication Communication: HOH   Cognition Arousal/Alertness: Awake/alert Behavior During  Therapy: Anxious Overall Cognitive Status: Within Functional Limits for tasks assessed                                 General Comments: A&Ox4.   General Comments  SpO2 >96% throughout    Exercises Other Exercises Other  Exercises: Education on role of OT, POC, and d/c recommendations Other Exercises: Education on the importance of sitting upright in chair to optimize breathing        Home Living Family/patient expects to be discharged to:: Private residence Living Arrangements: Spouse/significant other Available Help at Discharge: Family;Available PRN/intermittently;Other (Comment) (pt reports her husband has a LE amputation and uses a w/c. Children live nearby and can assist PRN) Type of Home: House Home Access: Stairs to enter [employer] of Steps: 5 Entrance Stairs-Rails: Left Home Layout: One level     Bathroom Shower/Tub: Tub/shower unit         Home Equipment: Shower seat;Hand held shower head;Grab bars - tub/shower;Other (comment)   Additional Comments: Uses O2 at baseline (2-3L)      Prior Functioning/Environment Level of Independence: Independent  Gait / Transfers Assistance Needed: Independent without AD for functional mobility of short household distances (~112f). Pt reports no recent falls. ADL's / Homemaking Assistance Needed: Independent with ADLs. Able to perform simple meal prep. Children assist with other IADLs   Comments: Pt reports no recent falls.        OT Problem List: Decreased strength;Decreased activity tolerance;Impaired balance (sitting and/or standing);Cardiopulmonary status limiting activity      OT Treatment/Interventions: Self-care/ADL training;Therapeutic exercise;Energy conservation;DME and/or AE instruction;Therapeutic activities;Patient/family education;Balance training    OT Goals(Current goals can be found in the care plan section) Acute Rehab OT Goals Patient Stated Goal: to improve breathing OT Goal Formulation: With patient Time For Goal Achievement: 02/13/21 Potential to Achieve Goals: Good ADL Goals Pt Will Perform Grooming: with modified independence;standing Pt Will Perform Lower Body Dressing: with modified independence;sit  to/from stand Pt Will Transfer to Toilet: with modified independence;ambulating;regular height toilet  OT Frequency: Min 1X/week    AM-PAC OT "6 Clicks" Daily Activity     Outcome Measure Help from another person eating meals?: None Help from another person taking care of personal grooming?: None Help from another person toileting, which includes using toliet, bedpan, or urinal?: A Little Help from another person bathing (including washing, rinsing, drying)?: A Little Help from another person to put on and taking off regular upper body clothing?: None Help from another person to put on and taking off regular lower body clothing?: A Little 6 Click Score: 21   End of Session Equipment Utilized During Treatment: Gait belt;Rolling walker;Oxygen Nurse Communication: Mobility status  Activity Tolerance: Patient tolerated treatment well Patient left: in chair;with call bell/phone within reach;with chair alarm set  OT Visit Diagnosis: Muscle weakness (generalized) (M62.81);Unsteadiness on feet (R26.81)                Time: [id number] Time Calculation (min): 29 min Charges:  OT General Charges $OT Visit: 1 Visit OT Evaluation $OT Eval Moderate Complexity: 1 Mod OT Treatments $Self Care/Home Management : 8-22 mins  LFredirick [place] OTR/[place]

## 2021-01-30 NOTE — Progress Notes (Addendum)
Patient has had a total of 30 units of Novolog for critical lab. No new acute mental changes.

## 2021-01-31 LAB — CBC
HCT: 33.4 % — ABNORMAL LOW (ref 36.0–46.0)
Hemoglobin: 11 g/dL — ABNORMAL LOW (ref 12.0–15.0)
MCH: 28.1 pg (ref 26.0–34.0)
MCHC: 32.9 g/dL (ref 30.0–36.0)
MCV: 85.2 fL (ref 80.0–100.0)
Platelets: 204 10*3/uL (ref 150–400)
RBC: 3.92 MIL/uL (ref 3.87–5.11)
RDW: 13.7 % (ref 11.5–15.5)
WBC: 12 10*3/uL — ABNORMAL HIGH (ref 4.0–10.5)
nRBC: 0 % (ref 0.0–0.2)

## 2021-01-31 LAB — BASIC METABOLIC PANEL
Anion gap: 13 (ref 5–15)
BUN: 38 mg/dL — ABNORMAL HIGH (ref 8–23)
CO2: 27 mmol/L (ref 22–32)
Calcium: 8.6 mg/dL — ABNORMAL LOW (ref 8.9–10.3)
Chloride: 91 mmol/L — ABNORMAL LOW (ref 98–111)
Creatinine, Ser: 0.99 mg/dL (ref 0.44–1.00)
GFR, Estimated: 59 mL/min — ABNORMAL LOW (ref >60)
Glucose, Bld: 261 mg/dL — ABNORMAL HIGH (ref 70–99)
Potassium: 4.3 mmol/L (ref 3.5–5.1)
Sodium: 131 mmol/L — ABNORMAL LOW (ref 135–145)

## 2021-01-31 LAB — GLUCOSE, CAPILLARY
Glucose-Capillary: 278 mg/dL — ABNORMAL HIGH (ref 70–99)
Glucose-Capillary: 304 mg/dL — ABNORMAL HIGH (ref 70–99)
Glucose-Capillary: 279 mg/dL — ABNORMAL HIGH (ref 70–99)

## 2021-01-31 MED ORDER — PREDNISONE 10 MG PO TABS: ORAL_TABLET | ORAL | 0 refills | Status: DC

## 2021-01-31 MED ORDER — FUROSEMIDE 20 MG PO TABS: 20.0000 mg | ORAL_TABLET | Freq: Every day | ORAL | 0 refills | Status: DC

## 2021-01-31 NOTE — Discharge Summary (Addendum)
Physician Discharge Summary  [name] [id number] DOB: Aug 25, 1945 DOA: 01/28/2021  PCP: Rusty Aus, MD  Admit date: 01/28/2021 Discharge date: 01/31/2021  Admitted From: home  Disposition:  home   Recommendations for Outpatient Follow-up:  Follow up with PCP in 1-2 weeks F/u w/ pulmon, Dr. Lanney Gins, in 1 week  Home Health: therapy recs HH but pt refused it Equipment/Devices:  Discharge Condition: stable  CODE STATUS: Full  Diet recommendation: Heart Healthy / Carb Modified  Brief/Interim Summary: HPI was taken from Dr. Tobie Poet: [name] is a 75 y.o. female with medical history significant for CAD status post CABG in 2015, depression, anxiety, hypertension, OSA, COPD, iron deficiency anemia, aortic dilatation, presents emergency department for chief concerns of shortness of breath.   At bedside, she is no longer tripoding and resting in ED bed and does not appear to be in acute distress. She fully participated with physical exam. She is able to tell me her full name, DOB, location of hospital, and the current calendar year.   She states she has been short of breath for several weeks and it got worse today prompting her to present to the ED. She reports that in the last months to years she has baseline shortness of breath with ambulation from bed to restroom. She denies recent fever, nausea, vomiting, abdominal pain, dysuria, diarrhea, hematuria, blood in her stool, syncope, lost of consciousness. She reports chest pain every time she coughs. She reports no new cough. She is unsure if she has had weight gain or lost. She states she may have gained a couple or a few pounds.    She does not wear a cpap as she can not tolerate it.    Hospital course from Dr. Jimmye Norman 8/28-8/30/22: Pt presented w/ shortness of breath likely secondary to COPD exacerbation. Pt was treated w/ bronchodilators, steroids, & lasix. Pulmon was consulted as pt had multiple recent COPD flares. Pt will f/u  outpatient w/ Dr. Lanney Gins. Of note, pt also has anxiety that was treated w/ ativan & hydroxyzine prn. PT/OT evaluated the pt and recommended HH but pt refused it.   Discharge Diagnoses:  Active Problems:   Obstructive sleep apnea   Essential hypertension   Chronic obstructive pulmonary disease (HCC)   Anemia   Depression with anxiety   Coronary Artery Disease s/p CABG 10/2013   Stenosis of coronary artery stent   Generalized weakness   Depression, major, single episode, complete remission (HCC)   COPD exacerbation (HCC)   IDA (iron deficiency anemia)  COPD exacerbation: complicated by anxiety. Continue on bronchodilators, steroids and encourage incentive spirometry. V/Q scan showed no focal deficit to suggests PE.    Anxiety: likely severe. Ativan, hydroxyzine prn    OSA: CPAP qhs however pt does not wear it as she can not tolerate it   Chronic hypoxic respiratory failure: likely secondary to COPD & untreated OSA. Continue on supplemental oxygen, uses 3L [place]    Hx of CAD: s/p CABG in 2015. Continue on home dose of imdur, losartan, plavix.   DM2: poorly controlled. Continue on SSI w/ accuchecks   Hypothyroidism: continue on home dose of levothyroxine     Depression: severity unknown. Continue on home dose of dose of duloxetine, fluoxetine   Insomnia: continue on home dose of trazodone   Hyponatremia: labile. Will continue to monitor   GERD: continue on PPI   Discharge Instructions  Discharge Instructions     Diet - low sodium heart healthy   Complete  by: As directed    Diet Carb Modified   Complete by: As directed    Discharge instructions   Complete by: As directed    F/u w/ PCP in 1-2 weeks. F/u w/ pulmon, Dr. Lanney Gins in 1 week   Increase activity slowly   Complete by: As directed       Allergies as of 01/31/2021       Reactions   Amitriptyline Other (See Comments)   Unknown reaction   Benadryl [diphenhydramine] Shortness Of Breath   Demerol [meperidine]  Other (See Comments)   Unknown reaction   Gabapentin Other (See Comments)   Unknown reaction   Meperidine Hcl Other (See Comments)   Unknown reaction   Mirtazapine Other (See Comments)   Unknown reaction   Olanzapine Other (See Comments)   Unknown reaction   Voltaren [diclofenac Sodium] Shortness Of Breath   Zetia [ezetimibe] Other (See Comments)   Weakness in legs, shakiness all over   Ativan [lorazepam] Other (See Comments)   Causes double vision at highter than .5 mg dose   Atorvastatin Other (See Comments)   Muscle aches and weakness   Budesonide-formoterol Fumarate Other (See Comments)   Shakiness, tremors   Bupropion Hcl Other (See Comments)   "cloud over me" depression   Caffeine Other (See Comments)   jitters   Codeine Sulfate Other (See Comments)   Makes chest hurt like a heart attack   Lisinopril Cough   Metformin Nausea And Vomiting   Mometasone Furoate Nausea And Vomiting   Morphine Sulfate Other (See Comments)   Chest pain like a heart attack   Other Other (See Comments)   Beta Blockers, reaction shortness of breath   Oxycodone-acetaminophen Nausea And Vomiting   Pioglitazone Other (See Comments)   Cannot take because of risk of bladder cancer   Propoxyphene N-acetaminophen Nausea And Vomiting   Rosuvastatin Other (See Comments)   Muscle aches and weakness   Shellfish Allergy Diarrhea   Suvorexant Other (See Comments)   Jerking/nervous    Ticagrelor    Other reaction(s): Other (See Comments) "slowed heart rate" & chest pain   Tramadol Nausea Only   Trazodone And Nefazodone Nausea And Vomiting   Venlafaxine Other (See Comments)   Unknown reaction   Zolpidem Tartrate Other (See Comments)    Jittery, diarrhea   Latex Rash        Medication List     TAKE these medications    acetaminophen 500 MG tablet Commonly known as: TYLENOL Take 500-1,000 mg by mouth every 6 (six) hours as needed (pain).   albuterol (2.5 MG/3ML) 0.083% nebulizer  solution Commonly known as: PROVENTIL Take 2.5 mg by nebulization every 4 (four) hours as needed for shortness of breath or wheezing.   Anoro Ellipta 62.5-25 MCG/INH Aepb Generic drug: umeclidinium-vilanterol Inhale 1 puff into the lungs daily.   aspirin 81 MG EC tablet Take 1 tablet (81 mg total) by mouth daily. Swallow whole.   CALTRATE 600 PLUS-VIT D PO Take 1 tablet by mouth 2 (two) times daily.   clopidogrel 75 MG tablet Commonly known as: PLAVIX Take 75 mg by mouth daily.   DULoxetine 20 MG capsule Commonly known as: CYMBALTA Take 1 capsule (20 mg total) by mouth in the morning.   FLUoxetine 40 MG capsule Commonly known as: PROZAC Take 40 mg by mouth 2 (two) times daily.   furosemide 20 MG tablet Commonly known as: Lasix Take 1 tablet (20 mg total) by mouth daily. What changed: when to  take this   glipiZIDE 10 MG 24 hr tablet Commonly known as: GLUCOTROL XL Take 10 mg by mouth in the morning.   insulin NPH-regular Human (70-30) 100 UNIT/ML injection Inject 60 Units into the skin 2 (two) times daily with a meal. Inject 60u under the skin every morning at breakfast and inject 60u under the skin every evening with supper   isosorbide mononitrate 30 MG 24 hr tablet Commonly known as: IMDUR Take 1 tablet (30 mg total) by mouth daily.   levothyroxine 125 MCG tablet Commonly known as: SYNTHROID Take 125 mcg by mouth daily before breakfast.   losartan 25 MG tablet Commonly known as: COZAAR Take 1 tablet (25 mg total) by mouth in the morning.   nystatin 100000 UNIT/ML suspension Commonly known as: MYCOSTATIN Take by mouth.   pantoprazole 40 MG tablet Commonly known as: PROTONIX Take 40 mg by mouth in the morning.   potassium chloride 10 MEQ tablet Commonly known as: KLOR-CON Take 1 tablet (10 mEq total) by mouth daily.   predniSONE 10 MG tablet Commonly known as: DELTASONE '40mg'$  daily x 1 day, 35 mg daily x 1 day, '30mg'$  daily x 1 day, '25mg'$  daily x 1 day, 20  mg daily x 1 day, 15 mg daily x 1 day, '10mg'$  daily x 1 day, then 5 mg daily x 1 and then stop   promethazine 25 MG tablet Commonly known as: PHENERGAN Take 25 mg by mouth every 6 (six) hours as needed for nausea or vomiting.   torsemide 20 MG tablet Commonly known as: DEMADEX Take 20 mg by mouth daily.   traZODone 100 MG tablet Commonly known as: DESYREL Take 200 mg by mouth at bedtime.        Follow-up Information     Ottie Glazier, MD Follow up in 1 week(s).   Specialty: Pulmonary Disease Why: f/u for COVID Contact information: [place] [address] [phone]         Rusty Aus, MD Follow up in 2 week(s).   Specialty: Internal Medicine Contact information: [place] [address] [phone]         [name], MD .   Specialty: Cardiology Contact information: [address] [place] [address] [phone]                Allergies  Allergen Reactions   Amitriptyline Other (See Comments)    Unknown reaction   Benadryl [Diphenhydramine] Shortness Of Breath   Demerol [Meperidine] Other (See Comments)    Unknown reaction   Gabapentin Other (See Comments)    Unknown reaction   Meperidine Hcl Other (See Comments)    Unknown reaction   Mirtazapine Other (See Comments)    Unknown reaction   Olanzapine Other (See Comments)    Unknown reaction    Voltaren [Diclofenac Sodium] Shortness Of Breath   Zetia [Ezetimibe] Other (See Comments)    Weakness in legs, shakiness all over   Ativan [Lorazepam] Other (See Comments)    Causes double vision at highter than .5 mg dose   Atorvastatin Other (See Comments)    Muscle aches and weakness   Budesonide-Formoterol Fumarate Other (See Comments)    Shakiness, tremors   Bupropion Hcl Other (See Comments)    "cloud over me" depression   Caffeine Other (See Comments)    jitters   Codeine Sulfate  Other (See Comments)    Makes chest hurt like a  heart attack   Lisinopril Cough   Metformin Nausea And Vomiting   Mometasone Furoate Nausea And Vomiting   Morphine Sulfate Other (See Comments)    Chest pain like a heart attack   Other Other (See Comments)    Beta Blockers, reaction shortness of breath   Oxycodone-Acetaminophen Nausea And Vomiting   Pioglitazone Other (See Comments)    Cannot take because of risk of bladder cancer   Propoxyphene N-Acetaminophen Nausea And Vomiting   Rosuvastatin Other (See Comments)    Muscle aches and weakness   Shellfish Allergy Diarrhea   Suvorexant Other (See Comments)    Jerking/nervous    Ticagrelor     Other reaction(s): Other (See Comments) "slowed heart rate" & chest pain   Tramadol Nausea Only   Trazodone And Nefazodone Nausea And Vomiting   Venlafaxine Other (See Comments)    Unknown reaction   Zolpidem Tartrate Other (See Comments)     Jittery, diarrhea   Latex Rash    Consultations: Pulmon    Procedures/Studies: NM Pulmonary Perfusion  Result Date: 01/30/2021 CLINICAL DATA:  Shortness of breath EXAM: NUCLEAR MEDICINE PERFUSION LUNG SCAN TECHNIQUE: Perfusion images were obtained in multiple projections after intravenous injection of radiopharmaceutical. Ventilation scans intentionally deferred if perfusion scan and chest x-ray adequate for interpretation during COVID 19 epidemic. RADIOPHARMACEUTICALS:  4 mCi Tc-61mMAA IV COMPARISON:  01/28/2021 FINDINGS: There is adequate uptake of radioactive tracer throughout both lungs. No focal defect to suggest pulmonary embolism is noted. Cardiac shadow is enlarged. IMPRESSION: No focal defect to suggest pulmonary embolism. Electronically Signed   By: MInez CatalinaM.D.   On: 01/30/2021 16:07   DG Chest Port 1 View  Result Date: 01/30/2021 CLINICAL DATA:  Post VQ scan EXAM: PORTABLE CHEST 1 VIEW COMPARISON:  01/28/2021, 11/24/2020, 03/04/2018 FINDINGS: Post sternotomy changes with valve  replacement. Cardiomegaly with mild vascular congestion. Diffuse interstitial opacities some of which is felt due to chronic disease. No pleural effusion or pneumothorax. IMPRESSION: 1. Cardiomegaly with slight central congestion and chronic interstitial opacity. Electronically Signed   By: KDonavan FoilM.D.   On: 01/30/2021 16:34   DG Chest Port 1 View  Result Date: 01/28/2021 CLINICAL DATA:  Difficulty breathing.  Shortness of breath. EXAM: PORTABLE CHEST 1 VIEW COMPARISON:  November 24, 2020 FINDINGS: Stable cardiomegaly. The hila and mediastinum are normal. No pneumothorax. No nodules or masses. No focal infiltrates. Increased interstitial opacities in the lungs suggesting mild edema. IMPRESSION: Cardiomegaly and mild pulmonary edema. No other acute abnormalities. Electronically Signed   By: DDorise BullionIII M.D.   On: 01/28/2021 19:17   (Echo, Carotid, EGD, Colonoscopy, ERCP)    Subjective: Pt c/o fatigue    Discharge Exam: Vitals:   01/31/21 0447 01/31/21 0740  BP: 132/60 (!) 157/71  Pulse: 66 73  Resp: 15   Temp: 97.7 F (36.5 C) 97.8 F (36.6 C)  SpO2: 95% 100%   Vitals:   01/30/21 1652 01/30/21 2002 01/31/21 0447 01/31/21 0740  BP: (!) 146/69 125/61 132/60 (!) 157/71  Pulse: 94 78 66 73  Resp: (!) '22 18 15   '$ Temp: 99.9 F (37.7 C) 98 F (36.7 C) 97.7 F (36.5 C) 97.8 F (36.6 C)  TempSrc:  Oral Oral Oral  SpO2: 95% 100% 95% 100%    General: Pt is alert, awake, not in acute distress Cardiovascular: S1/S2 +, no rubs, no gallops Respiratory: diminished breath sounds b/l  Abdominal: Soft, NT, ND, bowel sounds + Extremities: no edema, no cyanosis  The results of significant diagnostics from this hospitalization (including imaging, microbiology, ancillary and laboratory) are listed below for reference.     Microbiology: Recent Results (from the past 240 hour(s))  Resp Panel by RT-PCR (Flu A&B, Covid) Nasopharyngeal Swab     Status: None   Collection Time:  01/28/21  9:25 PM   Specimen: Nasopharyngeal Swab; Nasopharyngeal(NP) swabs in vial transport medium  Result Value Ref Range Status   SARS Coronavirus 2 by RT PCR NEGATIVE NEGATIVE Final    Comment: (NOTE) SARS-CoV-2 target nucleic acids are NOT DETECTED.  The SARS-CoV-2 RNA is generally detectable in upper respiratory specimens during the acute phase of infection. The lowest concentration of SARS-CoV-2 viral copies this assay can detect is 138 copies/mL. A negative result does not preclude SARS-Cov-2 infection and should not be used as the sole basis for treatment or other patient management decisions. A negative result may occur with  improper specimen collection/handling, submission of specimen other than nasopharyngeal swab, presence of viral mutation(s) within the areas targeted by this assay, and inadequate number of viral copies(<138 copies/mL). A negative result must be combined with clinical observations, patient history, and epidemiological information. The expected result is Negative.  Fact Sheet for Patients:  EntrepreneurPulse.com.au  Fact Sheet for Healthcare Providers:  IncredibleEmployment.be  This test is no t yet approved or cleared by the [place] FDA and  has been authorized for detection and/or diagnosis of SARS-CoV-2 by FDA under an Emergency Use Authorization (EUA). This EUA will remain  in effect (meaning this test can be used) for the duration of the COVID-19 declaration under Section 564(b)(1) of the Act, 21 U.S.C.section 360bbb-3(b)(1), unless the authorization is terminated  or revoked sooner.       Influenza A by PCR NEGATIVE NEGATIVE Final   Influenza B by PCR NEGATIVE NEGATIVE Final    Comment: (NOTE) The Xpert Xpress SARS-CoV-2/FLU/RSV plus assay is intended as an aid in the diagnosis of influenza from Nasopharyngeal swab specimens and should not be used as a sole basis for treatment. Nasal washings  and aspirates are unacceptable for Xpert Xpress SARS-CoV-2/FLU/RSV testing.  Fact Sheet for Patients: EntrepreneurPulse.com.au  Fact Sheet for Healthcare Providers: IncredibleEmployment.be  This test is not yet approved or cleared by the [place] FDA and has been authorized for detection and/or diagnosis of SARS-CoV-2 by FDA under an Emergency Use Authorization (EUA). This EUA will remain in effect (meaning this test can be used) for the duration of the COVID-19 declaration under Section 564(b)(1) of the Act, 21 U.S.C. section 360bbb-3(b)(1), unless the authorization is terminated or revoked.  Performed at [facility], [address] [place],  [address]      Labs: BNP (last 3 results) Recent Labs    11/05/20 1729 11/24/20 1645 01/28/21 1959  BNP 231.2* 169.3* 123456*   Basic Metabolic Panel: Recent Labs  Lab 01/28/21 1959 01/29/21 0647 01/30/21 0355 01/30/21 1723 01/31/21 0410  NA 134* 132* 131*  --  131*  K 4.5 4.9 3.7  --  4.3  CL 98 92* 91*  --  91*  CO2 27 28 32  --  27  GLUCOSE 226* 394* 254* 546* 261*  BUN 19 24* 34*  --  38*  CREATININE 0.92 1.05* 1.15*  --  0.99  CALCIUM 8.8* 8.6* 8.4*  --  8.6*   Liver Function Tests: Recent Labs  Lab 01/28/21 1959  AST 39  ALT 18  ALKPHOS 90  BILITOT 0.5  PROT 6.7  ALBUMIN 3.3*  No results for input(s): LIPASE, AMYLASE in the last 168 hours. No results for input(s): AMMONIA in the last 168 hours. CBC: Recent Labs  Lab 01/28/21 1959 01/29/21 0647 01/30/21 0355 01/31/21 0410  WBC 9.0 8.8 11.2* 12.0*  NEUTROABS 6.7  --   --   --   HGB 9.9* 10.5* 10.0* 11.0*  HCT 29.8* 32.2* 29.5* 33.4*  MCV 87.9 85.9 86.3 85.2  PLT 142* 167 166 204   Cardiac Enzymes: No results for input(s): CKTOTAL, CKMB, CKMBINDEX, TROPONINI in the last 168 hours. BNP: Invalid input(s): POCBNP CBG: Recent Labs  Lab 01/30/21 2048 01/30/21 2114 01/31/21 0210  01/31/21 0815 01/31/21 1131  GLUCAP 513* 533* 278* 304* 279*   D-Dimer Recent Labs    01/28/21 2125  DDIMER 1.90*   Hgb A1c Recent Labs    01/30/21 0355  HGBA1C 8.6*   Lipid Profile No results for input(s): CHOL, HDL, LDLCALC, TRIG, CHOLHDL, LDLDIRECT in the last 72 hours. Thyroid function studies Recent Labs    01/28/21 2125  TSH 8.993*   Anemia work up No results for input(s): VITAMINB12, FOLATE, FERRITIN, TIBC, IRON, RETICCTPCT in the last 72 hours. Urinalysis    Component Value Date/Time   COLORURINE STRAW (A) 11/25/2020 0420   APPEARANCEUR CLEAR (A) 11/25/2020 0420   APPEARANCEUR Clear 11/25/2012 1335   LABSPEC 1.003 (L) 11/25/2020 0420   LABSPEC 1.017 11/25/2012 1335   PHURINE 6.0 11/25/2020 0420   GLUCOSEU 50 (A) 11/25/2020 0420   GLUCOSEU Negative 11/25/2012 1335   HGBUR SMALL (A) 11/25/2020 0420   HGBUR negative 11/11/2009 0854   BILIRUBINUR NEGATIVE 11/25/2020 0420   BILIRUBINUR Negative 11/25/2012 1335   KETONESUR NEGATIVE 11/25/2020 0420   PROTEINUR NEGATIVE 11/25/2020 0420   UROBILINOGEN 0.2 11/11/2009 0854   NITRITE NEGATIVE 11/25/2020 0420   LEUKOCYTESUR NEGATIVE 11/25/2020 0420   LEUKOCYTESUR Negative 11/25/2012 1335   Sepsis Labs Invalid input(s): PROCALCITONIN,  WBC,  LACTICIDVEN Microbiology Recent Results (from the past 240 hour(s))  Resp Panel by RT-PCR (Flu A&B, Covid) Nasopharyngeal Swab     Status: None   Collection Time: 01/28/21  9:25 PM   Specimen: Nasopharyngeal Swab; Nasopharyngeal(NP) swabs in vial transport medium  Result Value Ref Range Status   SARS Coronavirus 2 by RT PCR NEGATIVE NEGATIVE Final    Comment: (NOTE) SARS-CoV-2 target nucleic acids are NOT DETECTED.  The SARS-CoV-2 RNA is generally detectable in upper respiratory specimens during the acute phase of infection. The lowest concentration of SARS-CoV-2 viral copies this assay can detect is 138 copies/mL. A negative result does not preclude SARS-Cov-2 infection  and should not be used as the sole basis for treatment or other patient management decisions. A negative result may occur with  improper specimen collection/handling, submission of specimen other than nasopharyngeal swab, presence of viral mutation(s) within the areas targeted by this assay, and inadequate number of viral copies(<138 copies/mL). A negative result must be combined with clinical observations, patient history, and epidemiological information. The expected result is Negative.  Fact Sheet for Patients:  EntrepreneurPulse.com.au  Fact Sheet for Healthcare Providers:  IncredibleEmployment.be  This test is no t yet approved or cleared by the [place] FDA and  has been authorized for detection and/or diagnosis of SARS-CoV-2 by FDA under an Emergency Use Authorization (EUA). This EUA will remain  in effect (meaning this test can be used) for the duration of the COVID-19 declaration under Section 564(b)(1) of the Act, 21 U.S.C.section 360bbb-3(b)(1), unless the authorization is terminated  or revoked sooner.  Influenza A by PCR NEGATIVE NEGATIVE Final   Influenza B by PCR NEGATIVE NEGATIVE Final    Comment: (NOTE) The Xpert Xpress SARS-CoV-2/FLU/RSV plus assay is intended as an aid in the diagnosis of influenza from Nasopharyngeal swab specimens and should not be used as a sole basis for treatment. Nasal washings and aspirates are unacceptable for Xpert Xpress SARS-CoV-2/FLU/RSV testing.  Fact Sheet for Patients: EntrepreneurPulse.com.au  Fact Sheet for Healthcare Providers: IncredibleEmployment.be  This test is not yet approved or cleared by the [place] FDA and has been authorized for detection and/or diagnosis of SARS-CoV-2 by FDA under an Emergency Use Authorization (EUA). This EUA will remain in effect (meaning this test can be used) for the duration of the COVID-19 declaration  under Section 564(b)(1) of the Act, 21 U.S.C. section 360bbb-3(b)(1), unless the authorization is terminated or revoked.  Performed at [facility], [address], [place], [place] [address]      Time coordinating discharge: Over 30 minutes  SIGNED:   Wyvonnia Dusky, MD  [facility] 01/31/2021, 3:02 PM Pager   If 7PM-7AM, please contact night-coverage

## 2021-01-31 NOTE — TOC Transition Note (Addendum)
Transition of Care [facility]) - CM/SW Discharge Note   Patient Details  Name: [name] MRN: [id number] Date of Birth: Mar 16, 1946  [facility]) CM/SW Contact:  Candie Chroman, LCSW Phone Number: 01/31/2021, 3:16 PM   Clinical Narrative:  Patient has orders to discharge home today. She confirmed she does not want home health, RW, or 3-in-1. Husband said they already have DME at home. Encouraged her to reach out to her PCP if she changes her mind about home health. No further concerns. CSW signing off.    4:05 pm: Husband told nurse that patient does need a walker. Referral made to [employer].  Final next level of care: Home/Self Care Barriers to Discharge: Barriers Resolved   Patient Goals and CMS Choice        Discharge Placement                  Name of family member notified: Wakita Laberge Patient and family notified of of transfer: 01/31/21  Discharge Plan and Services                                     Social Determinants of Health (SDOH) Interventions     Readmission Risk Interventions Readmission Risk Prevention Plan 01/31/2021 09/08/2020  Transportation Screening - Complete  PCP or Specialist Appt within 3-5 Days - Complete  HRI or [place] - Complete  Palliative Care Screening - Not Applicable  Medication Review (RN Care Manager) - Complete  PCP or Specialist appointment within 3-5 days of discharge Complete -  [place] or Home Care Consult Patient refused -  Palliative Care Screening Not Applicable -  [place] Not Applicable -  Some recent data might be hidden

## 2021-01-31 NOTE — Progress Notes (Signed)
Mobility Specialist - Progress Note   01/31/21 1400  Mobility  Activity Ambulated in hall;Ambulated in room  Level of Assistance Modified independent, requires aide device or extra time  Assistive Device None  Distance Ambulated (ft) 80 ft  Mobility Ambulated with assistance in hallway  Mobility Response Tolerated well  Mobility performed by Mobility specialist  $Mobility charge 1 Mobility    Ambulated in room/hallway modI on 2L. Winded. O2 maintained upper 90s. HR 87 bpm.    Kathee Delton Mobility Specialist 01/31/21, 2:22 PM

## 2021-01-31 NOTE — Progress Notes (Signed)
Pulmonary Medicine          Date: 01/31/2021,   MRN# [id number] [name] 09/20/1945     Admission                  Current    Referring physician: Dr Jimmye Norman   CHIEF COMPLAINT:   Severe Acute exacerbation of COPD   HISTORY OF PRESENT ILLNESS   75 yo with hx of CABG 2015 , depression,anxiety, hypertension, OSA, COPD, iron deficiency anemia, aortic dilatation, presents emergency department for cc of dyspnea and sob. She is chronically hypoxemic with 3L/min [place] requirement. At bedside, she is no longer tripoding and resting in ED bed and does not appear to be in acute distress. She fully participated with physical exam. She is able to tell me her full name, DOB, location of hospital, and the current calendar year.She denies recent fever, nausea, vomiting, abdominal pain, dysuria, diarrhea, hematuria, blood in her stool, syncope, lost of consciousness. She reports chest pain every time she coughs. She reports no new cough. She is unsure if she has had weight gain or lost. She denies flu like illness. She denies LE edema. PCCM consult placed for further evaluation and management.   01/31/21- patient is improved. She had hyperglycemic episode overnight but this has improved on this am measurements. CXR with bilateral pulmonary congestion. VQ is normal. UOP 4300 ovenight.  Cleared for dc home from pulm standpoint.    PAST MEDICAL HISTORY   Past Medical History:  Diagnosis Date   1st degree AV block    ACE-inhibitor cough    Allergic rhinitis    Anemia    iron deficiency anemia   Anxiety    Aortic ectasia (HCC)    a. CT abd in 12/2016 incidentally noted aortic atherosclerosis and infrarenal abdominal aortic ectasia measuring as large as 2.7 cm with recommendation to repeat US in 2023.   Arthritis    Asthma    Cataract    Chronic depression    Chronic diastolic CHF (congestive heart failure) (HCC)    Chronic headache    COPD (chronic obstructive pulmonary disease)  (HCC)    Coronary artery disease    a. DES to RCA and mid Cx 2009. b. CABG and bioprosthetic AVR May 2015. c. cutting balloon to prox Cx in 05/2016   Diabetes mellitus    type 2   Diverticulitis of colon    Essential hypertension    GERD (gastroesophageal reflux disease)    Hearing loss    History of blood transfusion 2013   History of prosthetic aortic valve replacement    HOH (hard of hearing)    Hypercholesterolemia    intolerance of statins and niaspan   IDA (iron deficiency anemia) 02/03/2019   Mobitz type 1 second degree AV block    OSA (obstructive sleep apnea)    mild, intolerant of cpap   PAD (peripheral artery disease) ([place])    a. atherosclerosis by CT abd 12/2016 in LE.   PONV (postoperative nausea and vomiting)    Statin intolerance    Thyroid disease      SURGICAL HISTORY   Past Surgical History:  Procedure Laterality Date   ABDOMINAL HYSTERECTOMY     ABDOMINAL HYSTERECTOMY W/ PARTIAL VAGINACTOMY     AORTIC VALVE REPLACEMENT N/A 10/12/2013   Procedure: AORTIC VALVE REPLACEMENT (AVR);  Surgeon: [name], MD;  Location: [facility];  Service: Open Heart Surgery;  Laterality: N/A;   [place]  BARTHOLIN GLAND CYST EXCISION     BLADDER SUSPENSION     BREAST BIOPSY Bilateral 09/11/2000   neg   BREAST BIOPSY Left 07/24/2010   neg   BREAST CYST EXCISION  1988   bilateral nonmalignant tumors, x3   CARDIAC CATHETERIZATION     CARDIAC CATHETERIZATION N/A 05/25/2016   Procedure: Coronary Balloon Angioplasty;  Surgeon: Leonie Man, MD;  Location: [facility];  Service: Cardiovascular;  Laterality: N/A;   CARDIAC CATHETERIZATION N/A 05/25/2016   Procedure: Coronary/Graft Angiography;  Surgeon: Leonie Man, MD;  Location: [facility];  Service: Cardiovascular;  Laterality: N/A;   CATARACT EXTRACTION W/ INTRAOCULAR LENS  IMPLANT, BILATERAL     CHOLECYSTECTOMY  2001   COLECTOMY     lap sigmoid   COLONOSCOPY  2014   polyps found, 2 clamped  off.   CORONARY ANGIOPLASTY  10/29/2007   Prox RCA & Mid Cx.   CORONARY ARTERY BYPASS GRAFT N/A 10/12/2013   Procedure: CORONARY ARTERY BYPASS GRAFT TIMES TWO;  Surgeon: [name], MD;  Location: [facility];  Service: Open Heart Surgery;  Laterality: N/A;   CORONARY/GRAFT ANGIOGRAPHY N/A 09/20/2017   Procedure: CORONARY/GRAFT ANGIOGRAPHY;  Surgeon: Sherren Mocha, MD;  Location: [facility];  Service: Cardiovascular;  Laterality: N/A;   LEFT HEART CATHETERIZATION WITH CORONARY ANGIOGRAM N/A 10/09/2013   Procedure: LEFT HEART CATHETERIZATION WITH CORONARY ANGIOGRAM;  Surgeon: [name], MD;  Location: [facility];  Service: Cardiovascular;  Laterality: N/A;   RIGHT/LEFT HEART CATH AND CORONARY ANGIOGRAPHY N/A 11/03/2020   Procedure: RIGHT/LEFT HEART CATH AND CORONARY ANGIOGRAPHY;  Surgeon: Corey Skains, MD;  Location: [facility];  Service: Cardiovascular;  Laterality: N/A;   STERNAL WIRES REMOVAL N/A 04/13/2014   Procedure: STERNAL WIRES REMOVAL;  Surgeon: [name], MD;  Location: [facility];  Service: Thoracic;  Laterality: N/A;   TEE WITHOUT CARDIOVERSION N/A 11/03/2020   Procedure: TRANSESOPHAGEAL ECHOCARDIOGRAM (TEE);  Surgeon: Corey Skains, MD;  Location: [facility];  Service: Cardiovascular;  Laterality: N/A;   THYROIDECTOMY     TONSILLECTOMY     TUBAL LIGATION     VAGINAL DELIVERY     3   VISCERAL ARTERY INTERVENTION N/A 08/16/2016   Procedure: Visceral Artery Intervention;  Surgeon: Algernon Huxley, MD;  Location: [facility];  Service: Cardiovascular;  Laterality: N/A;     FAMILY HISTORY   Family History  Problem Relation Age of Onset   Breast cancer Mother 20   Hypertension Father    Mesothelioma Father    Asthma Father    Stroke Paternal Grandfather    Heart disease Other    Breast cancer Maternal Aunt    Breast cancer Paternal Aunt      SOCIAL HISTORY   Social History   Tobacco Use   Smoking status: Former    Packs/day: 0.50     Years: 30.00    Pack years: 15.00    Types: Cigarettes    Quit date: 10/02/2013    Years since quitting: 7.3   Smokeless tobacco: Never  Vaping Use   Vaping Use: Never used  Substance Use Topics   Alcohol use: No   Drug use: No     MEDICATIONS    Home Medication:    Current Medication:  Current Facility-Administered Medications:    acetaminophen (TYLENOL) tablet 650 mg, 650 mg, Oral, Q6H PRN **OR** acetaminophen (TYLENOL) suppository 650 mg, 650 mg, Rectal, Q6H PRN, Cox, Amy  N, DO   aspirin EC tablet 81 mg, 81 mg, Oral, Daily, Cox, Amy N, DO, 81 mg at 01/31/21 0827   clopidogrel (PLAVIX) tablet 75 mg, 75 mg, Oral, Daily, Cox, Amy N, DO, 75 mg at 01/31/21 [id number]   DULoxetine (CYMBALTA) DR capsule 20 mg, 20 mg, Oral, q AM, Cox, Amy N, DO, 20 mg at 01/31/21 0831   enoxaparin (LOVENOX) injection 40 mg, 40 mg, Subcutaneous, Q24H, Cox, Amy N, DO, 40 mg at 01/31/21 0023   FLUoxetine (PROZAC) capsule 40 mg, 40 mg, Oral, BID, Cox, Amy N, DO, 40 mg at 01/31/21 [id number]   furosemide (LASIX) injection 40 mg, 40 mg, Intravenous, Daily, Wyvonnia Dusky, MD, 40 mg at 01/31/21 [id number]   HYDROcodone-acetaminophen (NORCO/VICODIN) 5-325 MG per tablet 1 tablet, 1 tablet, Oral, Q6H PRN, Wyvonnia Dusky, MD, 1 tablet at 01/30/21 1236   hydrOXYzine (ATARAX/VISTARIL) tablet 25 mg, 25 mg, Oral, TID PRN, Wyvonnia Dusky, MD, 25 mg at 01/30/21 1236   insulin aspart (novoLOG) injection 0-15 Units, 0-15 Units, Subcutaneous, TID AC & HS, Mansy, Jan A, MD, 8 Units at 01/31/21 1200   insulin glargine-yfgn [facility]) injection 10 Units, 10 Units, Subcutaneous, Daily, Wyvonnia Dusky, MD, 10 Units at 01/31/21 0830   isosorbide mononitrate (IMDUR) 24 hr tablet 30 mg, 30 mg, Oral, Daily, Cox, Amy N, DO, 30 mg at 01/31/21 [id number]   levothyroxine (SYNTHROID) tablet 125 mcg, 125 mcg, Oral, QAC breakfast, Cox, Amy N, DO, 125 mcg at 01/31/21 0646   LORazepam (ATIVAN) injection 0.5 mg, 0.5 mg, Intravenous, Q8H PRN,  Wyvonnia Dusky, MD, 0.5 mg at 01/30/21 [id number]   losartan (COZAAR) tablet 25 mg, 25 mg, Oral, q AM, Cox, Amy N, DO, 25 mg at 01/31/21 0839   ondansetron (ZOFRAN) tablet 4 mg, 4 mg, Oral, Q6H PRN **OR** ondansetron (ZOFRAN) injection 4 mg, 4 mg, Intravenous, Q6H PRN, Cox, Amy N, DO   pantoprazole (PROTONIX) EC tablet 40 mg, 40 mg, Oral, q AM, Cox, Amy N, DO, 40 mg at 01/31/21 [id number]   predniSONE (DELTASONE) tablet 40 mg, 40 mg, Oral, Q breakfast, Jimmye Norman, Jamiese M, MD, 40 mg at 01/31/21 [id number]   traZODone (DESYREL) tablet 200 mg, 200 mg, Oral, QHS, Cox, Amy N, DO, 200 mg at 01/30/21 2153   umeclidinium-vilanterol (ANORO ELLIPTA) 62.5-25 MCG/INH 1 puff, 1 puff, Inhalation, Daily, Cox, Amy N, DO, 1 puff at 01/31/21 [id number]    ALLERGIES   Amitriptyline, Benadryl [diphenhydramine], Demerol [meperidine], Gabapentin, Meperidine hcl, Mirtazapine, Olanzapine, Voltaren [diclofenac sodium], Zetia [ezetimibe], Ativan [lorazepam], Atorvastatin, Budesonide-formoterol fumarate, Bupropion hcl, Caffeine, Codeine sulfate, Lisinopril, Metformin, Mometasone furoate, Morphine sulfate, Other, Oxycodone-acetaminophen, Pioglitazone, Propoxyphene n-acetaminophen, Rosuvastatin, Shellfish allergy, Suvorexant, Ticagrelor, Tramadol, Trazodone and nefazodone, Venlafaxine, Zolpidem tartrate, and Latex     REVIEW OF SYSTEMS    Review of Systems:  Gen:  Denies  fever, sweats, chills weigh loss  HEENT: Denies blurred vision, double vision, ear pain, eye pain, hearing loss, nose bleeds, sore throat Cardiac:  No dizziness, chest pain or heaviness, chest tightness,edema Resp:   Denies cough or sputum porduction, shortness of breath,wheezing, hemoptysis,  Gi: Denies swallowing difficulty, stomach pain, nausea or vomiting, diarrhea, constipation, bowel incontinence Gu:  Denies bladder incontinence, burning urine Ext:   Denies Joint pain, stiffness or swelling Skin: Denies  skin rash, easy bruising or bleeding or hives Endoc:   Denies polyuria, polydipsia , polyphagia or weight change Psych:   Denies depression, insomnia or hallucinations   Other:  All other systems negative  VS: BP (!) 157/71 (BP Location: Right Arm)   Pulse 73   Temp 97.8 F (36.6 C) (Oral)   Resp 15   SpO2 100%      PHYSICAL EXAM    GENERAL:NAD, no fevers, chills, no weakness no fatigue HEAD: Normocephalic, atraumatic.  EYES: Pupils equal, round, reactive to light. Extraocular muscles intact. No scleral icterus.  MOUTH: Moist mucosal membrane. Dentition intact. No abscess noted.  EAR, NOSE, THROAT: Clear without exudates. No external lesions.  NECK: Supple. No thyromegaly. No nodules. No JVD.  PULMONARY: Diffuse coarse rhonchi right sided +wheezes CARDIOVASCULAR: S1 and S2. Regular rate and rhythm. No murmurs, rubs, or gallops. No edema. Pedal pulses 2+ bilaterally.  GASTROINTESTINAL: Soft, nontender, nondistended. No masses. Positive bowel sounds. No hepatosplenomegaly.  MUSCULOSKELETAL: No swelling, clubbing, or edema. Range of motion full in all extremities.  NEUROLOGIC: Cranial nerves II through XII are intact. No gross focal neurological deficits. Sensation intact. Reflexes intact.  SKIN: No ulceration, lesions, rashes, or cyanosis. Skin warm and dry. Turgor intact.  PSYCHIATRIC: Mood, affect within normal limits. The patient is awake, alert and oriented x 3. Insight, judgment intact.       IMAGING    NM Pulmonary Perfusion  Result Date: 01/30/2021 CLINICAL DATA:  Shortness of breath EXAM: NUCLEAR MEDICINE PERFUSION LUNG SCAN TECHNIQUE: Perfusion images were obtained in multiple projections after intravenous injection of radiopharmaceutical. Ventilation scans intentionally deferred if perfusion scan and chest x-ray adequate for interpretation during COVID 19 epidemic. RADIOPHARMACEUTICALS:  4 mCi Tc-29mMAA IV COMPARISON:  01/28/2021 FINDINGS: There is adequate uptake of radioactive tracer throughout both lungs. No focal  defect to suggest pulmonary embolism is noted. Cardiac shadow is enlarged. IMPRESSION: No focal defect to suggest pulmonary embolism. Electronically Signed   By: MInez CatalinaM.D.   On: 01/30/2021 16:07   DG Chest Port 1 View  Result Date: 01/30/2021 CLINICAL DATA:  Post VQ scan EXAM: PORTABLE CHEST 1 VIEW COMPARISON:  01/28/2021, 11/24/2020, 03/04/2018 FINDINGS: Post sternotomy changes with valve replacement. Cardiomegaly with mild vascular congestion. Diffuse interstitial opacities some of which is felt due to chronic disease. No pleural effusion or pneumothorax. IMPRESSION: 1. Cardiomegaly with slight central congestion and chronic interstitial opacity. Electronically Signed   By: KDonavan FoilM.D.   On: 01/30/2021 16:34   DG Chest Port 1 View  Result Date: 01/28/2021 CLINICAL DATA:  Difficulty breathing.  Shortness of breath. EXAM: PORTABLE CHEST 1 VIEW COMPARISON:  November 24, 2020 FINDINGS: Stable cardiomegaly. The hila and mediastinum are normal. No pneumothorax. No nodules or masses. No focal infiltrates. Increased interstitial opacities in the lungs suggesting mild edema. IMPRESSION: Cardiomegaly and mild pulmonary edema. No other acute abnormalities. Electronically Signed   By: DDorise BullionIII M.D.   On: 01/28/2021 19:17      ASSESSMENT/PLAN   Acute on chronic hypoxemic respiratory faiulre  -patient has severe multi vessel CAD based on cardiac cath few months ago  - acutely there is evidence of pulmonary edema.  -she had episode of respiratory distress and I spoke to RN who takes care of her who explained this was panic attack  -patient does have moderate pulmonary hypertension on RHC -ddimer was elevated on this admission  -id like to r/o acute venous thromboembolism with VQ scan due to mild renal impairment -will consider sildenafil TID for pulmonary hypertension once more diruresed -vital were rechecked during my examination and were stable  Moderate pulmonary hypertension   -  sildenafil 20 TID post diuresis  Acute  pulmonary edema   -recheck TTE   Acute exacerbation of COPD with chronic hypoxemia   - continue prednisone at '40mg'$  with taper by '10mg'$  daily       Thank you for allowing me to participate in the care of this patient.  ent in counseling and coordination of care.   Patient/Family are satisfied with care plan and all questions have been answered.  This document was prepared using Dragon voice recognition software and may include unintentional dictation errors.     Ottie Glazier, M.D.  Division of [facility]

## 2021-01-31 NOTE — Progress Notes (Addendum)
Inpatient Diabetes Program Recommendations  AACE/ADA: New Consensus Statement on Inpatient Glycemic Control (2015)  Target Ranges:  Prepandial:   less than 140 mg/dL      Peak postprandial:   less than 180 mg/dL (1-2 hours)      Critically ill patients:  140 - 180 mg/dL  Results for AROUSH, RANALLI (MRN [id number]) as of 01/31/2021 07:26  Ref. Range 01/30/2021 08:24 01/30/2021 12:04 01/30/2021 16:54 01/30/2021 16:56 01/30/2021 18:23 01/30/2021 20:48 01/30/2021 21:14 01/31/2021 02:10  Glucose-Capillary Latest Ref Range: 70 - 99 mg/dL 350 (H)  11 units Novolog  10 units Semglee '@0838'$   396 (H)  15 units Novolog  455 (H) 469 (H)  15 units Novolog  554 (HH)  15 units Novolog  513 (HH) 533 (HH)  20 units Novolog  278 (H)    Home DM Meds: 70/30 Insulin 60 units BID                              Glipizide 10 mg Daily   Current Orders: Novolog Moderate Correction Scale/ SSI (0-15 units) TID AC + HS                            Semglee 10 units Daily         Note patient received Solumedrol PM on 08/27 and again on AM 08/28   Starting Prednisone 40 mg daily yesterday 08/29       MD- Note patient takes large doses of 70/30 Insulin at home.   Received a total of 30 units Semglee on 08/28--Only got 10 units Semglee yesterday 08/29   CBGs severely elevated yesterday    Please consider:   1. Increase Semglee to 30 units Daily (0.35 units/kg) (Gets 84 units basal insulin in her 2 doses of 70/30 Insulin at home)  If 10 unit dose already given this AM, please also order Semglee 20 units X 1 dose to be given this AM as well   2. Start Novolog Meal Coverage: Novolog 10 units TID with meals Hold if pt eats <50% of meal, Hold if pt NPO    --Will follow patient during hospitalization--  Wyn Quaker RN, MSN, CDE Diabetes Coordinator Inpatient Glycemic Control Team Team Pager: [phone] (8a-5p)

## 2021-01-31 NOTE — Progress Notes (Signed)
Physical Therapy Treatment Patient Details Name: [name] MRN: [id number] DOB: 15-Sep-1945 Today's Date: 01/31/2021    History of Present Illness Pt is a 75 y.o. female presenting to hospital 8/27 with acute worsening of SOB.  Pt admitted with COPD exacerbation complicated by anxiety.  PMH includes dCHF, COPD, DM, chronic respiratory failure on 3 L home O2, anemia, anxiety, HOH, OSA, NSTEMI, dizziness, AVR, and CABG.    PT Comments    Pt resting in bed upon PT arrival; pt's husband present; pt agreeable to PT session.  Modified independent with bed mobility; SBA with transfers; and CGA to SBA ambulating 30 feet and then 40 feet with RW in room.  Limited distance ambulating d/t SOB (pt also appearing anxious with activity).  O2 sats 94% or greater on 2 L O2 via nasal cannula during sessions activities.  Will continue to focus on strengthening, activity tolerance, and increasing ambulation distance during hospital stay.   Follow Up Recommendations  Home health PT     Equipment Recommendations  Rolling walker with 5" wheels    Recommendations for Other Services OT consult     Precautions / Restrictions Precautions Precautions: Fall Restrictions Weight Bearing Restrictions: No    Mobility  Bed Mobility Overal bed mobility: Modified Independent             General bed mobility comments: Semi-supine to/from sitting    Transfers Overall transfer level: Needs assistance Equipment used: None Transfers: Sit to/from Stand Sit to Stand: Supervision         General transfer comment: single UE support for standing balance  Ambulation/Gait Ambulation/Gait assistance: Min guard;Supervision Gait Distance (Feet):  (30 feet; 40 feet) Assistive device: Rolling walker (2 wheeled)   Gait velocity: decreased   General Gait Details: step through steady gait with RW use   Stairs             Wheelchair Mobility    Modified Rankin (Stroke Patients Only)        Balance Overall balance assessment: Needs assistance Sitting-balance support: No upper extremity supported;Feet supported Sitting balance-Leahy Scale: Good Sitting balance - Comments: steady sitting reaching within BOS   Standing balance support: No upper extremity supported Standing balance-Leahy Scale: Good Standing balance comment: steady standing reaching within BOS                            Cognition Arousal/Alertness: Awake/alert Behavior During Therapy: Anxious Overall Cognitive Status: Within Functional Limits for tasks assessed                                 General Comments: A&Ox4.      Exercises      General Comments  Nursing cleared pt for participation in physical therapy.  Pt agreeable to PT session.      Pertinent Vitals/Pain Pain Assessment: No/denies pain.  HR WFL during sessions activities.    Home Living                      Prior Function            PT Goals (current goals can now be found in the care plan section) Acute Rehab PT Goals Patient Stated Goal: to improve breathing PT Goal Formulation: With patient Time For Goal Achievement: 02/13/21 Potential to Achieve Goals: Good Progress towards PT goals: Progressing toward goals  Frequency    Min 2X/week      PT Plan Current plan remains appropriate    Co-evaluation              AM-PAC PT "6 Clicks" Mobility   Outcome Measure  Help needed turning from your back to your side while in a flat bed without using bedrails?: None Help needed moving from lying on your back to sitting on the side of a flat bed without using bedrails?: None Help needed moving to and from a bed to a chair (including a wheelchair)?: A Little Help needed standing up from a chair using your arms (e.g., wheelchair or bedside chair)?: A Little Help needed to walk in hospital room?: A Little Help needed climbing 3-5 steps with a railing? : A Little 6 Click Score: 20     End of Session Equipment Utilized During Treatment: Gait belt;Oxygen (2 L O2 via nasal cannula) Activity Tolerance: Other (comment) (limited d/t SOB) Patient left: in bed;with call bell/phone within reach;with bed alarm set;with family/visitor present Nurse Communication: Mobility status;Precautions PT Visit Diagnosis: Other abnormalities of gait and mobility (R26.89);Muscle weakness (generalized) (M62.81);Unsteadiness on feet (R26.81)     Time: [id number] PT Time Calculation (min) (ACUTE ONLY): 16 min  Charges:  $Therapeutic Exercise: 8-22 mins                    Leitha Bleak, PT 01/31/21, 11:04 AM

## 2021-02-01 ENCOUNTER — Other Ambulatory Visit: Payer: Self-pay

## 2021-02-01 ENCOUNTER — Observation Stay: Payer: Medicare HMO

## 2021-02-01 ENCOUNTER — Emergency Department: Payer: Medicare HMO

## 2021-02-01 ENCOUNTER — Observation Stay
Admission: EM | Admit: 2021-02-01 | Discharge: 2021-02-02 | Disposition: A | Discharge status: Home / Self Care | Payer: Medicare HMO | Attending: Family Medicine | Admitting: Family Medicine

## 2021-02-01 DIAGNOSIS — R0600 Dyspnea, unspecified: Secondary | ICD-10-CM

## 2021-02-01 DIAGNOSIS — I2510 Atherosclerotic heart disease of native coronary artery without angina pectoris: Secondary | ICD-10-CM | POA: Diagnosis not present

## 2021-02-01 DIAGNOSIS — D631 Anemia in chronic kidney disease: Secondary | ICD-10-CM | POA: Insufficient documentation

## 2021-02-01 DIAGNOSIS — Z20822 Contact with and (suspected) exposure to covid-19: Secondary | ICD-10-CM | POA: Diagnosis not present

## 2021-02-01 DIAGNOSIS — Z91040 Latex allergy status: Secondary | ICD-10-CM | POA: Insufficient documentation

## 2021-02-01 DIAGNOSIS — E1122 Type 2 diabetes mellitus with diabetic chronic kidney disease: Secondary | ICD-10-CM | POA: Insufficient documentation

## 2021-02-01 DIAGNOSIS — I130 Hypertensive heart and chronic kidney disease with heart failure and stage 1 through stage 4 chronic kidney disease, or unspecified chronic kidney disease: Secondary | ICD-10-CM | POA: Diagnosis not present

## 2021-02-01 DIAGNOSIS — Z7902 Long term (current) use of antithrombotics/antiplatelets: Secondary | ICD-10-CM | POA: Diagnosis not present

## 2021-02-01 DIAGNOSIS — Z7982 Long term (current) use of aspirin: Secondary | ICD-10-CM | POA: Diagnosis not present

## 2021-02-01 DIAGNOSIS — Z951 Presence of aortocoronary bypass graft: Secondary | ICD-10-CM | POA: Diagnosis not present

## 2021-02-01 DIAGNOSIS — N1831 Chronic kidney disease, stage 3a: Secondary | ICD-10-CM | POA: Insufficient documentation

## 2021-02-01 DIAGNOSIS — R0602 Shortness of breath: Principal | ICD-10-CM | POA: Insufficient documentation

## 2021-02-01 DIAGNOSIS — J45909 Unspecified asthma, uncomplicated: Secondary | ICD-10-CM | POA: Insufficient documentation

## 2021-02-01 DIAGNOSIS — Z79899 Other long term (current) drug therapy: Secondary | ICD-10-CM | POA: Diagnosis not present

## 2021-02-01 DIAGNOSIS — Z87891 Personal history of nicotine dependence: Secondary | ICD-10-CM | POA: Diagnosis not present

## 2021-02-01 DIAGNOSIS — I5033 Acute on chronic diastolic (congestive) heart failure: Secondary | ICD-10-CM | POA: Insufficient documentation

## 2021-02-01 DIAGNOSIS — J449 Chronic obstructive pulmonary disease, unspecified: Secondary | ICD-10-CM | POA: Insufficient documentation

## 2021-02-01 DIAGNOSIS — Z794 Long term (current) use of insulin: Secondary | ICD-10-CM | POA: Diagnosis not present

## 2021-02-01 DIAGNOSIS — Z9861 Coronary angioplasty status: Secondary | ICD-10-CM | POA: Diagnosis not present

## 2021-02-01 LAB — CBC
HCT: 34.6 % — ABNORMAL LOW (ref 36.0–46.0)
HCT: 31.6 % — ABNORMAL LOW (ref 36.0–46.0)
Hemoglobin: 11.8 g/dL — ABNORMAL LOW (ref 12.0–15.0)
Hemoglobin: 10.8 g/dL — ABNORMAL LOW (ref 12.0–15.0)
MCH: 29.2 pg (ref 26.0–34.0)
MCH: 29.5 pg (ref 26.0–34.0)
MCHC: 34.1 g/dL (ref 30.0–36.0)
MCHC: 34.2 g/dL (ref 30.0–36.0)
MCV: 85.6 fL (ref 80.0–100.0)
MCV: 86.3 fL (ref 80.0–100.0)
Platelets: 265 10*3/uL (ref 150–400)
Platelets: 167 10*3/uL (ref 150–400)
RBC: 4.04 MIL/uL (ref 3.87–5.11)
RBC: 3.66 MIL/uL — ABNORMAL LOW (ref 3.87–5.11)
RDW: 13.6 % (ref 11.5–15.5)
RDW: 13.6 % (ref 11.5–15.5)
WBC: 21.4 10*3/uL — ABNORMAL HIGH (ref 4.0–10.5)
WBC: 10.6 10*3/uL — ABNORMAL HIGH (ref 4.0–10.5)
nRBC: 0 % (ref 0.0–0.2)
nRBC: 0 % (ref 0.0–0.2)

## 2021-02-01 LAB — RESP PANEL BY RT-PCR (FLU A&B, COVID) ARPGX2
Influenza A by PCR: NEGATIVE
Influenza B by PCR: NEGATIVE
SARS Coronavirus 2 by RT PCR: NEGATIVE

## 2021-02-01 LAB — BASIC METABOLIC PANEL
Anion gap: 14 (ref 5–15)
BUN: 41 mg/dL — ABNORMAL HIGH (ref 8–23)
CO2: 27 mmol/L (ref 22–32)
Calcium: 8.7 mg/dL — ABNORMAL LOW (ref 8.9–10.3)
Chloride: 92 mmol/L — ABNORMAL LOW (ref 98–111)
Creatinine, Ser: 1.3 mg/dL — ABNORMAL HIGH (ref 0.44–1.00)
GFR, Estimated: 43 mL/min — ABNORMAL LOW (ref >60)
Glucose, Bld: 139 mg/dL — ABNORMAL HIGH (ref 70–99)
Potassium: 3.8 mmol/L (ref 3.5–5.1)
Sodium: 133 mmol/L — ABNORMAL LOW (ref 135–145)

## 2021-02-01 LAB — TSH: TSH: 7.959 u[IU]/mL — ABNORMAL HIGH (ref 0.350–4.500)

## 2021-02-01 LAB — PROCALCITONIN
Procalcitonin: 0.17 ng/mL
Procalcitonin: 0.16 ng/mL

## 2021-02-01 LAB — CREATININE, SERUM
Creatinine, Ser: 1.38 mg/dL — ABNORMAL HIGH (ref 0.44–1.00)
GFR, Estimated: 40 mL/min — ABNORMAL LOW (ref >60)

## 2021-02-01 LAB — TROPONIN I (HIGH SENSITIVITY)
Troponin I (High Sensitivity): 56 ng/L — ABNORMAL HIGH (ref <18)
Troponin I (High Sensitivity): 45 ng/L — ABNORMAL HIGH (ref <18)

## 2021-02-01 LAB — LACTIC ACID, PLASMA
Lactic Acid, Venous: 1.7 mmol/L (ref 0.5–1.9)
Lactic Acid, Venous: 1.7 mmol/L (ref 0.5–1.9)

## 2021-02-01 LAB — BRAIN NATRIURETIC PEPTIDE: B Natriuretic Peptide: 103 pg/mL — ABNORMAL HIGH (ref 0.0–100.0)

## 2021-02-01 MED ORDER — CLOPIDOGREL BISULFATE 75 MG PO TABS: 75.0000 mg | ORAL_TABLET | Freq: Every day | ORAL | Status: DC

## 2021-02-01 MED ORDER — PREDNISONE 20 MG PO TABS: 30.0000 mg | ORAL_TABLET | Freq: Every day | ORAL | Status: DC

## 2021-02-01 MED ORDER — PREDNISONE 20 MG PO TABS: 40.0000 mg | ORAL_TABLET | Freq: Every day | ORAL | Status: DC

## 2021-02-01 MED ORDER — PREDNISONE 20 MG PO TABS: 20.0000 mg | ORAL_TABLET | Freq: Every day | ORAL | Status: DC

## 2021-02-01 MED ORDER — ASPIRIN 81 MG PO TBEC: 81.0000 mg | DELAYED_RELEASE_TABLET | Freq: Every day | ORAL | Status: DC

## 2021-02-01 MED ORDER — PREDNISONE 20 MG PO TABS
40.0000 mg | ORAL_TABLET | Freq: Every day | ORAL | Status: AC
  Administered 2021-02-01: 40 mg via ORAL
  Filled 2021-02-01: qty 2

## 2021-02-01 MED ORDER — TRAZODONE HCL 100 MG PO TABS
200.0000 mg | ORAL_TABLET | Freq: Every day | ORAL | Status: DC
  Administered 2021-02-01: 200 mg via ORAL
  Filled 2021-02-01: qty 2

## 2021-02-01 MED ORDER — ACETAMINOPHEN 650 MG RE SUPP: 650.0000 mg | Freq: Four times a day (QID) | RECTAL | Status: DC | PRN

## 2021-02-01 MED ORDER — CLOPIDOGREL BISULFATE 75 MG PO TABS
75.0000 mg | ORAL_TABLET | Freq: Every day | ORAL | Status: DC
  Administered 2021-02-02: 75 mg via ORAL
  Filled 2021-02-01: qty 1

## 2021-02-01 MED ORDER — DULOXETINE HCL 20 MG PO CPEP: 20.0000 mg | ORAL_CAPSULE | Freq: Every morning | ORAL | Status: DC

## 2021-02-01 MED ORDER — ALBUTEROL SULFATE (2.5 MG/3ML) 0.083% IN NEBU
2.5000 mg | INHALATION_SOLUTION | RESPIRATORY_TRACT | Status: AC
  Administered 2021-02-01: 2.5 mg via RESPIRATORY_TRACT
  Filled 2021-02-01: qty 3

## 2021-02-01 MED ORDER — INSULIN GLARGINE-YFGN 100 UNIT/ML ~~LOC~~ SOLN
100.0000 [IU] | Freq: Every day | SUBCUTANEOUS | Status: DC
  Administered 2021-02-01: 100 [IU] via SUBCUTANEOUS
  Filled 2021-02-01 (×2): qty 1

## 2021-02-01 MED ORDER — ALBUTEROL SULFATE (2.5 MG/3ML) 0.083% IN NEBU: 2.5000 mg | INHALATION_SOLUTION | RESPIRATORY_TRACT | Status: DC | PRN

## 2021-02-01 MED ORDER — LEVOTHYROXINE SODIUM 50 MCG PO TABS
125.0000 ug | ORAL_TABLET | Freq: Every day | ORAL | Status: DC
  Administered 2021-02-02: 125 ug via ORAL
  Filled 2021-02-01: qty 1

## 2021-02-01 MED ORDER — HEPARIN SODIUM (PORCINE) 5000 UNIT/ML IJ SOLN
5000.0000 [IU] | Freq: Three times a day (TID) | INTRAMUSCULAR | Status: DC
  Administered 2021-02-01 – 2021-02-02 (×4): 5000 [IU] via SUBCUTANEOUS
  Filled 2021-02-01 (×3): qty 1

## 2021-02-01 MED ORDER — LORAZEPAM 2 MG/ML IJ SOLN
0.5000 mg | Freq: Once | INTRAMUSCULAR | Status: AC
  Administered 2021-02-01: 0.5 mg via INTRAVENOUS
  Filled 2021-02-01: qty 1

## 2021-02-01 MED ORDER — PREDNISONE 50 MG PO TABS: 25.0000 mg | ORAL_TABLET | Freq: Every day | ORAL | Status: DC

## 2021-02-01 MED ORDER — PREDNISONE 50 MG PO TABS: 35.0000 mg | ORAL_TABLET | Freq: Every day | ORAL | Status: DC

## 2021-02-01 MED ORDER — PANTOPRAZOLE SODIUM 40 MG PO TBEC
40.0000 mg | DELAYED_RELEASE_TABLET | Freq: Every morning | ORAL | Status: DC
  Administered 2021-02-02: 40 mg via ORAL
  Filled 2021-02-01: qty 1

## 2021-02-01 MED ORDER — PREDNISONE 10 MG PO TABS: 5.0000 mg | ORAL_TABLET | Freq: Every day | ORAL | Status: DC

## 2021-02-01 MED ORDER — FLUOXETINE HCL 40 MG PO CAPS: 40.0000 mg | ORAL_CAPSULE | Freq: Two times a day (BID) | ORAL | Status: DC

## 2021-02-01 MED ORDER — PREDNISONE 10 MG PO TABS: 15.0000 mg | ORAL_TABLET | Freq: Every day | ORAL | Status: DC

## 2021-02-01 MED ORDER — UMECLIDINIUM-VILANTEROL 62.5-25 MCG/INH IN AEPB
1.0000 | INHALATION_SPRAY | Freq: Every day | RESPIRATORY_TRACT | Status: DC
  Administered 2021-02-02: 1 via RESPIRATORY_TRACT
  Filled 2021-02-01: qty 14

## 2021-02-01 MED ORDER — PREDNISONE 10 MG PO TABS: 10.0000 mg | ORAL_TABLET | Freq: Every day | ORAL | Status: DC

## 2021-02-01 MED ORDER — ACETAMINOPHEN 500 MG PO TABS: 500.0000 mg | ORAL_TABLET | Freq: Four times a day (QID) | ORAL | Status: DC | PRN

## 2021-02-01 MED ORDER — ACETAMINOPHEN 325 MG PO TABS
650.0000 mg | ORAL_TABLET | Freq: Four times a day (QID) | ORAL | Status: DC | PRN
  Administered 2021-02-02: 650 mg via ORAL
  Filled 2021-02-01: qty 2

## 2021-02-01 NOTE — ED Notes (Signed)
Called lab to draw admission patient

## 2021-02-01 NOTE — ED Notes (Signed)
RN to bedside bc pt was up out of bed and sitting in chair. Patient has pulled off her oxygen, her cardiac leads and has urinated in the bed. Bed linens changes. Pt assisted back into bed.

## 2021-02-01 NOTE — ED Provider Notes (Signed)
[facility] Emergency Department Provider Note   ____________________________________________   Event Date/Time   First MD Initiated Contact with Patient 02/01/21 1004     (approximate)  I have reviewed the triage vital signs and the nursing notes.   HISTORY  Chief Complaint Shortness of Breath  EM caveat severe respiratory distress  HPI [name] is a 75 y.o. female reports severe shortness of breath.  Patient appears very anxious, almost panic-like, noted not to have hypoxia on pulse oximetry but she appears extremely tachypneic short of breath and anxious.  Husband reports that this is how her initial presentation to the hospital was on that she does suffer from apparent severe distress and anxiety  She denies being in pain but reports it feels like she cannot breathe.  Symptoms are worsening since this morning and of the same as when she was admitted to the hospital  No chest pain.  No leg swelling.  Past Medical History:  Diagnosis Date   1st degree AV block    ACE-inhibitor cough    Allergic rhinitis    Anemia    iron deficiency anemia   Anxiety    Aortic ectasia (HCC)    a. CT abd in 12/2016 incidentally noted aortic atherosclerosis and infrarenal abdominal aortic ectasia measuring as large as 2.7 cm with recommendation to repeat US in 2023.   Arthritis    Asthma    Cataract    Chronic depression    Chronic diastolic CHF (congestive heart failure) (HCC)    Chronic headache    COPD (chronic obstructive pulmonary disease) (HCC)    Coronary artery disease    a. DES to RCA and mid Cx 2009. b. CABG and bioprosthetic AVR May 2015. c. cutting balloon to prox Cx in 05/2016   Diabetes mellitus    type 2   Diverticulitis of colon    Essential hypertension    GERD (gastroesophageal reflux disease)    Hearing loss    History of blood transfusion 2013   History of prosthetic aortic valve replacement    HOH (hard of hearing)     Hypercholesterolemia    intolerance of statins and niaspan   IDA (iron deficiency anemia) 02/03/2019   Mobitz type 1 second degree AV block    OSA (obstructive sleep apnea)    mild, intolerant of cpap   PAD (peripheral artery disease) ([place])    a. atherosclerosis by CT abd 12/2016 in [place].   PONV (postoperative nausea and vomiting)    Statin intolerance    Thyroid disease     Patient Active Problem List   Diagnosis Date Noted   Acute on chronic diastolic (congestive) heart failure (HCC) 11/24/2020   SOB (shortness of breath) 11/24/2020   Acute hyponatremia 11/24/2020   Prolonged QT interval 11/24/2020   Angina pectoris ([place]) 11/03/2020   CHF exacerbation ([place]) 09/07/2020   Elevated troponin 09/07/2020   Chest pain 08/09/2020   Acute on chronic diastolic CHF (congestive heart failure) ([place]) 08/08/2020   Anemia due to stage 3a chronic kidney disease ([place]) 05/21/2019   IDA (iron deficiency anemia) 02/03/2019   COPD exacerbation ([place]) 12/28/2017   Depression, major, single episode, complete remission ([place]) 04/11/2017   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   Aortic ectasia, abdominal ([place]) 01/01/2017   GAD (generalized anxiety disorder) 09/26/2016   Acute posthemorrhagic anemia 08/24/2016   Acute respiratory failure with hypoxia ([place]) [id number]   Acute diastolic CHF (congestive heart failure) ([place]) 08/24/2016   Hyponatremia 08/24/2016  Leukocytosis 08/24/2016   Fever 08/24/2016   Diarrhea 08/24/2016   Generalized weakness 08/24/2016   Acute diverticulitis 08/16/2016   Gastrointestinal hemorrhage    Ataxia 08/09/2016   Dizziness 08/09/2016   Staggering gait 08/09/2016   NSTEMI (non-ST elevated myocardial infarction) [facility])    AVB (atrioventricular block)    Atypical chest pain    Unstable angina ([place]) 05/25/2016   Palpitations 05/25/2016   PUD - recent Rx for H.Pylori 05/25/2016   Stenosis of coronary artery stent    Recurrent major depressive disorder, in partial  remission ([place]) 07/29/2015   Sialadenitis 04/25/2015   Viral URI 03/24/2015   Hypoglycemia 08/25/2014   Nausea without vomiting 08/25/2014   Dyslipidemia-statin ontol    Long-term insulin use ([place]) 03/31/2014   Aortic valve disorder 11/11/2013   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 10/12/2013   Coronary Artery Disease s/p CABG 10/2013 10/10/2013   Other malaise and fatigue 09/11/2013   Alopecia 03/06/2013   Insomnia 02/06/2013   Gastroesophageal reflux disease without esophagitis 01/19/2013   Dysphagia, unspecified(787.20) 01/19/2013   Dyspnea 11/17/2012   Weakness 11/17/2012   Abnormal MRI of head 09/05/2012   Depression with anxiety 03/21/2012   Extrinsic asthma 11/12/2011   Anemia 06/07/2011   Chronic obstructive pulmonary disease ([place]) 01/25/2010   Acquired hypothyroidism 08/10/2009   MICROSCOPIC HEMATURIA 05/07/2009   GOITER 03/02/2009   Essential hypertension 01/06/2009   MEMORY LOSS 06/23/2008   Obstructive sleep apnea 12/25/2007   DM type 2 (diabetes mellitus, type 2) ([place]) 09/09/2006   ALLERGIC RHINITIS 09/09/2006   DIVERTICULOSIS, COLON 09/09/2006    Past Surgical History:  Procedure Laterality Date   ABDOMINAL HYSTERECTOMY     ABDOMINAL HYSTERECTOMY W/ PARTIAL VAGINACTOMY     AORTIC VALVE REPLACEMENT N/A 10/12/2013   Procedure: AORTIC VALVE REPLACEMENT (AVR);  Surgeon: [name], MD;  Location: [place];  Service: Open Heart Surgery;  Laterality: N/A;   APPENDECTOMY  1964   BARTHOLIN GLAND CYST EXCISION     BLADDER SUSPENSION     BREAST BIOPSY Bilateral 09/11/2000   neg   BREAST BIOPSY Left 07/24/2010   neg   BREAST CYST EXCISION  1988   bilateral nonmalignant tumors, x3   CARDIAC CATHETERIZATION     CARDIAC CATHETERIZATION N/A 05/25/2016   Procedure: Coronary Balloon Angioplasty;  Surgeon: Leonie Man, MD;  Location: [place] [facility];  Service: Cardiovascular;  Laterality: N/A;   CARDIAC CATHETERIZATION N/A 05/25/2016   Procedure: Coronary/Graft Angiography;   Surgeon: Leonie Man, MD;  Location: [place] CV [facility];  Service: Cardiovascular;  Laterality: N/A;   CATARACT EXTRACTION W/ INTRAOCULAR LENS  IMPLANT, BILATERAL     CHOLECYSTECTOMY  2001   COLECTOMY     lap sigmoid   COLONOSCOPY  2014   polyps found, 2 clamped off.   CORONARY ANGIOPLASTY  10/29/2007   Prox RCA & Mid Cx.   CORONARY ARTERY BYPASS GRAFT N/A 10/12/2013   Procedure: CORONARY ARTERY BYPASS GRAFT TIMES TWO;  Surgeon: [name], MD;  Location: [place] OR;  Service: Open Heart Surgery;  Laterality: N/A;   CORONARY/GRAFT ANGIOGRAPHY N/A 09/20/2017   Procedure: CORONARY/GRAFT ANGIOGRAPHY;  Surgeon: Sherren Mocha, MD;  Location: [place] [facility];  Service: Cardiovascular;  Laterality: N/A;   LEFT HEART CATHETERIZATION WITH CORONARY ANGIOGRAM N/A 10/09/2013   Procedure: LEFT HEART CATHETERIZATION WITH CORONARY ANGIOGRAM;  Surgeon: [name], MD;  Location: [facility] CATH LAB;  Service: Cardiovascular;  Laterality: N/A;   RIGHT/LEFT HEART CATH AND CORONARY ANGIOGRAPHY N/A  11/03/2020   Procedure: RIGHT/LEFT HEART CATH AND CORONARY ANGIOGRAPHY;  Surgeon: Corey Skains, MD;  Location: [place] CV LAB;  Service: Cardiovascular;  Laterality: N/A;   STERNAL WIRES REMOVAL N/A 04/13/2014   Procedure: STERNAL WIRES REMOVAL;  Surgeon: [name], MD;  Location: [place];  Service: Thoracic;  Laterality: N/A;   TEE WITHOUT CARDIOVERSION N/A 11/03/2020   Procedure: TRANSESOPHAGEAL ECHOCARDIOGRAM (TEE);  Surgeon: Corey Skains, MD;  Location: [facility] ORS;  Service: Cardiovascular;  Laterality: N/A;   THYROIDECTOMY     TONSILLECTOMY     TUBAL LIGATION     VAGINAL DELIVERY     3   VISCERAL ARTERY INTERVENTION N/A 08/16/2016   Procedure: Visceral Artery Intervention;  Surgeon: Algernon Huxley, MD;  Location: [place] [facility];  Service: Cardiovascular;  Laterality: N/A;    Prior to Admission medications   Medication Sig Start Date End Date Taking? Authorizing Provider   acetaminophen (TYLENOL) 500 MG tablet Take 500-1,000 mg by mouth every 6 (six) hours as needed (pain).    [provider]  albuterol (PROVENTIL) (2.5 MG/3ML) 0.083% nebulizer solution Take 2.5 mg by nebulization every 4 (four) hours as needed for shortness of breath or wheezing. 06/07/20   [provider]  ANORO ELLIPTA 62.5-25 MCG/INH AEPB Inhale 1 puff into the lungs daily. 10/11/20   [provider]  aspirin EC 81 MG EC tablet Take 1 tablet (81 mg total) by mouth daily. Swallow whole. 08/10/20   Samuella Cota, MD  Calcium-Vitamin D (CALTRATE 600 PLUS-VIT D PO) Take 1 tablet by mouth 2 (two) times daily.    [provider]  clopidogrel (PLAVIX) 75 MG tablet Take 75 mg by mouth daily. 11/15/20   [provider]  DULoxetine (CYMBALTA) 20 MG capsule Take 1 capsule (20 mg total) by mouth in the morning. 11/26/20   Jennye Boroughs, MD  FLUoxetine (PROZAC) 40 MG capsule Take 40 mg by mouth 2 (two) times daily. 08/23/20   [provider]  furosemide (LASIX) 20 MG tablet Take 1 tablet (20 mg total) by mouth daily. 01/31/21 03/02/21  Wyvonnia Dusky, MD  glipiZIDE (GLUCOTROL XL) 10 MG 24 hr tablet Take 10 mg by mouth in the morning. 07/19/20   [provider]  insulin NPH-regular Human (NOVOLIN 70/30) (70-30) 100 UNIT/ML injection Inject 60 Units into the skin 2 (two) times daily with a meal. Inject 60u under the skin every morning at breakfast and inject 60u under the skin every evening with supper    [provider]  isosorbide mononitrate (IMDUR) 30 MG 24 hr tablet Take 1 tablet (30 mg total) by mouth daily. 11/03/20 11/03/21  Corey Skains, MD  levothyroxine (SYNTHROID, LEVOTHROID) 125 MCG tablet Take 125 mcg by mouth daily before breakfast. 04/03/17   [provider]  losartan (COZAAR) 25 MG tablet Take 1 tablet (25 mg total) by mouth in the morning. 11/26/20   Jennye Boroughs, MD  nystatin (MYCOSTATIN) 100000 UNIT/ML  suspension Take by mouth. 11/21/20   [provider]  pantoprazole (PROTONIX) 40 MG tablet Take 40 mg by mouth in the morning.    [provider]  potassium chloride (KLOR-CON) 10 MEQ tablet Take 1 tablet (10 mEq total) by mouth daily. 11/26/20   Jennye Boroughs, MD  predniSONE (DELTASONE) 10 MG tablet '40mg'$  daily x 1 day, 35 mg daily x 1 day, '30mg'$  daily x 1 day, '25mg'$  daily x 1 day, 20 mg daily x 1 day, 15 mg  daily x 1 day, '10mg'$  daily x 1 day, then 5 mg daily x 1 and then stop 01/31/21   Wyvonnia Dusky, MD  promethazine (PHENERGAN) 25 MG tablet Take 25 mg by mouth every 6 (six) hours as needed for nausea or vomiting.    [provider]  torsemide (DEMADEX) 20 MG tablet Take 20 mg by mouth daily. 12/08/20   [provider]  traZODone (DESYREL) 100 MG tablet Take 200 mg by mouth at bedtime.    [provider]    Allergies Amitriptyline, Benadryl [diphenhydramine], Demerol [meperidine], Gabapentin, Meperidine hcl, Mirtazapine, Olanzapine, Voltaren [diclofenac sodium], Zetia [ezetimibe], Ativan [lorazepam], Atorvastatin, Budesonide-formoterol fumarate, Bupropion hcl, Caffeine, Codeine sulfate, Lisinopril, Metformin, Mometasone furoate, Morphine sulfate, Other, Oxycodone-acetaminophen, Pioglitazone, Propoxyphene n-acetaminophen, Rosuvastatin, Shellfish allergy, Suvorexant, Ticagrelor, Tramadol, Trazodone and nefazodone, Venlafaxine, Zolpidem tartrate, and Latex  Family History  Problem Relation Age of Onset   Breast cancer Mother 80   Hypertension Father    Mesothelioma Father    Asthma Father    Stroke Paternal Grandfather    Heart disease Other    Breast cancer Maternal Aunt    Breast cancer Paternal Aunt     Social History Social History   Tobacco Use   Smoking status: Former    Packs/day: 0.50    Years: 30.00    Pack years: 15.00    Types: Cigarettes    Quit date: 10/02/2013    Years since quitting: 7.3   Smokeless tobacco: Never  Vaping Use    Vaping Use: Never used  Substance Use Topics   Alcohol use: No   Drug use: No    Review of Systems  EM caveat    ____________________________________________   PHYSICAL EXAM:  VITAL SIGNS: ED Triage Vitals  Enc Vitals Group     BP 02/01/21 0955 104/81     Pulse Rate 02/01/21 0955 94     Resp 02/01/21 0955 (!) 34     Temp 02/01/21 0955 98.5 F (36.9 C)     Temp Source 02/01/21 0955 Oral     SpO2 02/01/21 0955 100 %     Weight 02/01/21 0956 203 lb (92.1 kg)     Height 02/01/21 0956 '5\' 6"'$  (1.676 m)     Head Circumference --      Peak Flow --      Pain Score 02/01/21 0955 6     Pain Loc --      Pain Edu? --      Excl. in [place]? --     Constitutional: Alert and oriented. Well appearing and in no acute distress. Eyes: Conjunctivae are normal. Head: Atraumatic. Nose: No congestion/rhinnorhea. Mouth/Throat: Mucous membranes are moist. Neck: No stridor.  Cardiovascular: Normal rate, regular rhythm. Grossly normal heart sounds.  Good peripheral circulation. Respiratory: Normal respiratory effort.  No retractions. Lungs CTAB. Gastrointestinal: Soft and nontender. No distention. Musculoskeletal: No lower extremity tenderness nor edema. Neurologic:  Normal speech and language. No gross focal neurologic deficits are appreciated.  Skin:  Skin is warm, dry and intact. No rash noted. Psychiatric: Mood and affect are normal. Speech and behavior are normal.  ____________________________________________   LABS (all labs ordered are listed, but only abnormal results are displayed)  Labs Reviewed  BASIC METABOLIC PANEL - Abnormal; Notable for the following components:      Result Value   Sodium 133 (*)    Chloride 92 (*)    Glucose, Bld 139 (*)    BUN 41 (*)    Creatinine,  Ser 1.30 (*)    Calcium 8.7 (*)    GFR, Estimated 43 (*)    All other components within normal limits  CBC - Abnormal; Notable for the following components:   WBC 21.4 (*)    Hemoglobin 11.8 (*)     HCT 34.6 (*)    All other components within normal limits  BRAIN NATRIURETIC PEPTIDE - Abnormal; Notable for the following components:   B Natriuretic Peptide 103.0 (*)    All other components within normal limits  TROPONIN I (HIGH SENSITIVITY) - Abnormal; Notable for the following components:   Troponin I (High Sensitivity) 56 (*)    All other components within normal limits  TROPONIN I (HIGH SENSITIVITY) - Abnormal; Notable for the following components:   Troponin I (High Sensitivity) 45 (*)    All other components within normal limits  RESP PANEL BY RT-PCR (FLU A&B, COVID) [id number]  PROCALCITONIN   ____________________________________________  EKG  Reviewed interpreted me at 10 AM Heart rate 99 QRS 99 QTc 480 's normal sinus rhythm, slight baseline wander.  No obvious ischemia, mild nonspecific T wave abnormality.  Notable artifact likely due to respiratory ____________________________________________  RADIOLOGY  DG Chest Port 1 View  Result Date: 02/01/2021 CLINICAL DATA:  75 year old female with history of shortness of breath. COPD. EXAM: PORTABLE CHEST 1 VIEW COMPARISON:  Chest x-ray 01/30/2021. FINDINGS: Lung volumes are normal. No consolidative airspace disease. No pleural effusions. Diffuse interstitial prominence and widespread peribronchial cuffing. No pneumothorax. No definite suspicious appearing pulmonary nodules or masses are noted. No evidence of pulmonary edema. Mild cardiomegaly. Upper mediastinal contours are within normal limits. Status post hemi median sternotomy for aortic valve replacement with what appears to be a stented bioprosthesis. IMPRESSION: 1. Diffuse interstitial prominence and peribronchial cuffing similar to prior studies suggestive of probable chronic bronchitis. 2. Cardiomegaly. Electronically Signed   By: Vinnie Langton M.D.   On: 02/01/2021 10:49    X-ray reviewed, interstitial prominence with peribronchial cuffing.  Appears similar to previous  studies cardiomegaly noted ____________________________________________   PROCEDURES  Procedure(s) performed: None  Procedures  Critical Care performed: no   ____________________________________________   INITIAL IMPRESSION / ASSESSMENT AND PLAN / ED COURSE  Pertinent labs & imaging results that were available during my care of the patient were reviewed by me and considered in my medical decision making (see chart for details).   Patient presents for shortness of breath.  Discharged just yesterday, she reports same symptoms when she was admitted.  She appears extremely anxious as well, was given anxiolysis here and appears to have had anxiolysis in the hospital which did provide her pretty significant relief.  She does have very mild and expiratory wheezing, but minimal.  She was recently evaluated and had perfusion study that did not demonstrate evidence of pulmonary embolism.  Today her EKG shows nonspecific abnormalities but she lacks chest pain but does have a very minimally elevated troponin from her baseline.  Given her recent treatment and improvement with COPD treatments will also could continue this.  She does have a more significant leukocytosis as compared to yesterday.  Clinical Course as of 02/01/21 1409  Wed Feb 01, 2021  1232 Patient is resting much more comfortably now.  Does have notable leukocytosis, and slight elevation of troponin.  She is much more comfortable reports her breathing feels improved, seems to have improved quite a bit with current treatments.  Discussed with the patient understanding agreeable with plan for admission to the hospital. [MQ]  Clinical Course User Index [MQ] Delman Kitten, MD    ----------------------------------------- 2:09 PM on 02/01/2021 ----------------------------------------- Reviewed case, discussed with Dr. Marcello Moores of the hospitalist service.  Patient will be admitted.  Will need further work-up as to her review presentation to  the ER today.  However of note on previous visit and compared to today her white count is notably elevated.  COVID and flu tests are negative.  Chest x-ray does not demonstrate obvious new infiltrate.  Troponin is mildly elevated but downtrending.  Procalcitonin just slightly elevated.  Will defer to hospitalist had a long discussion with Dr. Marcello Moores about this, further consideration for additional imaging and etiology.  Patient has however responded well to use of anxiolysis at this time.   ____________________________________________   FINAL CLINICAL IMPRESSION(S) / ED DIAGNOSES  Final diagnoses:  Dyspnea, unspecified type        Note:  This document was prepared using Dragon voice recognition software and may include unintentional dictation errors       Delman Kitten, MD 02/01/21 1410

## 2021-02-01 NOTE — H&P (Signed)
History and Physical    [name] [id number] DOB: 1946/04/07 DOA: 02/01/2021  PCP: Rusty Aus, MD  Patient coming from:home  I have personally briefly reviewed patient's old medical records in [place]  Chief Complaint:sob   HPI: [name] is a 75 y.o. female with medical history significant of  75 yo with hx of CABG 2015 , depression,anxiety, hypertension, OSA, COPD, iron deficiency anemia, aortic dilatation, presents emergency department with sob less than 24 hours after discharge s/p treatment for COPD exacerbation.  Patient states sob started this am around 9 am . No associated cough but noted pain across her lower chest described as pressure. Patient notes currently this has has improved notes a heavy discomfort in that area.  She notes shortness of breath as completely resolved.  Patient also endorse feeling of nausea and episode of emesis which she had in ED.  She does also  endorse lower stomach pain but denies diarrhea or dysuria.  Patient notes chills but no fever.   ED Course:   [id number] rhythm regular, nonspecific st-twave changes Afeb,bp104/81, hr 94 , rr 34 sat 100% NA133, K 3.8, cl 92 glu 139, cr 1.3 (0.99)  CE 56,46 ( base , 20-30) Procal 0.17 Bnp 103( 268) IMPRESSION: 1. Diffuse interstitial prominence and peribronchial cuffing similar to prior studies suggestive of probable chronic bronchitis. 2. Cardiomegaly.  covid:negative Review of Systems: As per HPI otherwise 10 point review of systems negative.   Past Medical History:  Diagnosis Date   1st degree AV block    ACE-inhibitor cough    Allergic rhinitis    Anemia    iron deficiency anemia   Anxiety    Aortic ectasia (HCC)    a. CT abd in 12/2016 incidentally noted aortic atherosclerosis and infrarenal abdominal aortic ectasia measuring as large as 2.7 cm with recommendation to repeat US in 2023.   Arthritis    Asthma    Cataract    Chronic depression    Chronic diastolic CHF  (congestive heart failure) (HCC)    Chronic headache    COPD (chronic obstructive pulmonary disease) (HCC)    Coronary artery disease    a. DES to RCA and mid Cx 2009. b. CABG and bioprosthetic AVR May 2015. c. cutting balloon to prox Cx in 05/2016   Diabetes mellitus    type 2   Diverticulitis of colon    Essential hypertension    GERD (gastroesophageal reflux disease)    Hearing loss    History of blood transfusion 2013   History of prosthetic aortic valve replacement    HOH (hard of hearing)    Hypercholesterolemia    intolerance of statins and niaspan   IDA (iron deficiency anemia) 02/03/2019   Mobitz type 1 second degree AV block    OSA (obstructive sleep apnea)    mild, intolerant of cpap   PAD (peripheral artery disease) ([place])    a. atherosclerosis by CT abd 12/2016 in [place].   PONV (postoperative nausea and vomiting)    Statin intolerance    Thyroid disease     Past Surgical History:  Procedure Laterality Date   ABDOMINAL HYSTERECTOMY     ABDOMINAL HYSTERECTOMY W/ PARTIAL VAGINACTOMY     AORTIC VALVE REPLACEMENT N/A 10/12/2013   Procedure: AORTIC VALVE REPLACEMENT (AVR);  Surgeon: [name], MD;  Location: [place];  Service: Open Heart Surgery;  Laterality: N/A;   APPENDECTOMY  1964   BARTHOLIN GLAND CYST EXCISION  BLADDER SUSPENSION     BREAST BIOPSY Bilateral 09/11/2000   neg   BREAST BIOPSY Left 07/24/2010   neg   BREAST CYST EXCISION  1988   bilateral nonmalignant tumors, x3   CARDIAC CATHETERIZATION     CARDIAC CATHETERIZATION N/A 05/25/2016   Procedure: Coronary Balloon Angioplasty;  Surgeon: Leonie Man, MD;  Location: [place] [facility];  Service: Cardiovascular;  Laterality: N/A;   CARDIAC CATHETERIZATION N/A 05/25/2016   Procedure: Coronary/Graft Angiography;  Surgeon: Leonie Man, MD;  Location: [place] [facility];  Service: Cardiovascular;  Laterality: N/A;   CATARACT EXTRACTION W/ INTRAOCULAR LENS  IMPLANT, BILATERAL     CHOLECYSTECTOMY  2001    COLECTOMY     lap sigmoid   COLONOSCOPY  2014   polyps found, 2 clamped off.   CORONARY ANGIOPLASTY  10/29/2007   Prox RCA & Mid Cx.   CORONARY ARTERY BYPASS GRAFT N/A 10/12/2013   Procedure: CORONARY ARTERY BYPASS GRAFT TIMES TWO;  Surgeon: [name], MD;  Location: [facility];  Service: Open Heart Surgery;  Laterality: N/A;   CORONARY/GRAFT ANGIOGRAPHY N/A 09/20/2017   Procedure: CORONARY/GRAFT ANGIOGRAPHY;  Surgeon: Sherren Mocha, MD;  Location: [place] [facility];  Service: Cardiovascular;  Laterality: N/A;   LEFT HEART CATHETERIZATION WITH CORONARY ANGIOGRAM N/A 10/09/2013   Procedure: LEFT HEART CATHETERIZATION WITH CORONARY ANGIOGRAM;  Surgeon: [name], MD;  Location[facility];  Service: Cardiovascular;  Laterality: N/A;   RIGHT/LEFT HEART CATH AND CORONARY ANGIOGRAPHY N/A 11/03/2020   Procedure: RIGHT/LEFT HEART CATH AND CORONARY ANGIOGRAPHY;  Surgeon: Corey Skains, MD;  Location: [place] [facility];  Service: Cardiovascular;  Laterality: N/A;   STERNAL WIRES REMOVAL N/A 04/13/2014   Procedure: STERNAL WIRES REMOVAL;  Surgeon: [name], MD;  Location: [place];  Service: Thoracic;  Laterality: N/A;   TEE WITHOUT CARDIOVERSION N/A 11/03/2020   Procedure: TRANSESOPHAGEAL ECHOCARDIOGRAM (TEE);  Surgeon: Corey Skains, MD;  Location: [facility];  Service: Cardiovascular;  Laterality: N/A;   THYROIDECTOMY     TONSILLECTOMY     TUBAL LIGATION     VAGINAL DELIVERY     3   VISCERAL ARTERY INTERVENTION N/A 08/16/2016   Procedure: Visceral Artery Intervention;  Surgeon: Algernon Huxley, MD;  Location: [place] [facility];  Service: Cardiovascular;  Laterality: N/A;     reports that she quit smoking about 7 years ago. Her smoking use included cigarettes. She has a 15.00 pack-year smoking history. She has never used smokeless tobacco. She reports that she does not drink alcohol and does not use drugs.  Allergies  Allergen Reactions   Amitriptyline Other (See Comments)     Unknown reaction   Benadryl [Diphenhydramine] Shortness Of Breath   Demerol [Meperidine] Other (See Comments)    Unknown reaction   Gabapentin Other (See Comments)    Unknown reaction   Meperidine Hcl Other (See Comments)    Unknown reaction   Mirtazapine Other (See Comments)    Unknown reaction   Olanzapine Other (See Comments)    Unknown reaction    Voltaren [Diclofenac Sodium] Shortness Of Breath   Zetia [Ezetimibe] Other (See Comments)    Weakness in legs, shakiness all over   Ativan [Lorazepam] Other (See Comments)    Causes double vision at highter than .5 mg dose   Atorvastatin Other (See Comments)    Muscle aches and weakness   Budesonide-Formoterol Fumarate Other (See Comments)    Shakiness, tremors   Bupropion Hcl  Other (See Comments)    "cloud over me" depression   Caffeine Other (See Comments)    jitters   Codeine Sulfate Other (See Comments)    Makes chest hurt like a heart attack   Lisinopril Cough   Metformin Nausea And Vomiting   Mometasone Furoate Nausea And Vomiting   Morphine Sulfate Other (See Comments)    Chest pain like a heart attack   Other Other (See Comments)    Beta Blockers, reaction shortness of breath   Oxycodone-Acetaminophen Nausea And Vomiting   Pioglitazone Other (See Comments)    Cannot take because of risk of bladder cancer   Propoxyphene N-Acetaminophen Nausea And Vomiting   Rosuvastatin Other (See Comments)    Muscle aches and weakness   Shellfish Allergy Diarrhea   Suvorexant Other (See Comments)    Jerking/nervous    Ticagrelor     Other reaction(s): Other (See Comments) "slowed heart rate" & chest pain   Tramadol Nausea Only   Trazodone And Nefazodone Nausea And Vomiting   Venlafaxine Other (See Comments)    Unknown reaction   Zolpidem Tartrate Other (See Comments)     Jittery, diarrhea   Latex Rash    Family History  Problem Relation Age of Onset   Breast cancer Mother 68   Hypertension Father    Mesothelioma  Father    Asthma Father    Stroke Paternal Grandfather    Heart disease Other    Breast cancer Maternal Aunt    Breast cancer Paternal Aunt    Prior to Admission medications   Medication Sig Start Date End Date Taking? Authorizing Provider  acetaminophen (TYLENOL) 500 MG tablet Take 500-1,000 mg by mouth every 6 (six) hours as needed (pain).    [provider]  albuterol (PROVENTIL) (2.5 MG/3ML) 0.083% nebulizer solution Take 2.5 mg by nebulization every 4 (four) hours as needed for shortness of breath or wheezing. 06/07/20   [provider]  ANORO ELLIPTA 62.5-25 MCG/INH AEPB Inhale 1 puff into the lungs daily. 10/11/20   [provider]  aspirin EC 81 MG EC tablet Take 1 tablet (81 mg total) by mouth daily. Swallow whole. 08/10/20   Samuella Cota, MD  Calcium-Vitamin D (CALTRATE 600 PLUS-VIT D PO) Take 1 tablet by mouth 2 (two) times daily.    [provider]  clopidogrel (PLAVIX) 75 MG tablet Take 75 mg by mouth daily. 11/15/20   [provider]  DULoxetine (CYMBALTA) 20 MG capsule Take 1 capsule (20 mg total) by mouth in the morning. 11/26/20   Jennye Boroughs, MD  FLUoxetine (PROZAC) 40 MG capsule Take 40 mg by mouth 2 (two) times daily. 08/23/20   [provider]  furosemide (LASIX) 20 MG tablet Take 1 tablet (20 mg total) by mouth daily. 01/31/21 03/02/21  Wyvonnia Dusky, MD  glipiZIDE (GLUCOTROL XL) 10 MG 24 hr tablet Take 10 mg by mouth in the morning. 07/19/20   [provider]  insulin NPH-regular Human (NOVOLIN 70/30) (70-30) 100 UNIT/ML injection Inject 60 Units into the skin 2 (two) times daily with a meal. Inject 60u under the skin every morning at breakfast and inject 60u under the skin every evening with supper    [provider]  isosorbide mononitrate (IMDUR) 30 MG 24 hr tablet Take 1 tablet (30 mg total) by mouth daily. 11/03/20 11/03/21  Corey Skains, MD  levothyroxine (SYNTHROID, LEVOTHROID) 125 MCG  tablet Take 125 mcg by mouth daily before breakfast. 04/03/17  [provider]  losartan (COZAAR) 25 MG tablet Take 1 tablet (25 mg total) by mouth in the morning. 11/26/20   Jennye Boroughs, MD  nystatin (MYCOSTATIN) 100000 UNIT/ML suspension Take by mouth. 11/21/20   [provider]  pantoprazole (PROTONIX) 40 MG tablet Take 40 mg by mouth in the morning.    [provider]  potassium chloride (KLOR-CON) 10 MEQ tablet Take 1 tablet (10 mEq total) by mouth daily. 11/26/20   Jennye Boroughs, MD  predniSONE (DELTASONE) 10 MG tablet '40mg'$  daily x 1 day, 35 mg daily x 1 day, '30mg'$  daily x 1 day, '25mg'$  daily x 1 day, 20 mg daily x 1 day, 15 mg daily x 1 day, '10mg'$  daily x 1 day, then 5 mg daily x 1 and then stop 01/31/21   Wyvonnia Dusky, MD  promethazine (PHENERGAN) 25 MG tablet Take 25 mg by mouth every 6 (six) hours as needed for nausea or vomiting.    [provider]  torsemide (DEMADEX) 20 MG tablet Take 20 mg by mouth daily. 12/08/20   [provider]  traZODone (DESYREL) 100 MG tablet Take 200 mg by mouth at bedtime.    [provider]    Physical Exam: Vitals:   02/01/21 1247 02/01/21 1300 02/01/21 1330 02/01/21 1409  BP: (!) 97/57 (!) 109/50 125/67 99/61  Pulse: 87 78 85 87  Resp: (!) 23 (!) 22 (!) 22 (!) 22  Temp:      TempSrc:      SpO2: 95% 99% 97% 100%  Weight:      Height:        Constitutional: NAD, calm, comfortable Vitals:   02/01/21 1247 02/01/21 1300 02/01/21 1330 02/01/21 1409  BP: (!) 97/57 (!) 109/50 125/67 99/61  Pulse: 87 78 85 87  Resp: (!) 23 (!) 22 (!) 22 (!) 22  Temp:      TempSrc:      SpO2: 95% 99% 97% 100%  Weight:      Height:       Eyes: PERRL, lids and conjunctivae normal ENMT: Mucous membranes are moist. Posterior pharynx clear of any exudate or lesions.Normal dentition.  Neck: normal, supple, no masses, no thyromegaly Respiratory: clear to auscultation bilaterally, no wheezing, no crackles. Normal  respiratory effort. No accessory muscle use.  Cardiovascular: Regular rate and rhythm, no murmurs / rubs / gallops. No extremity edema. 2+ pedal pulses. No carotid bruits.  Abdomen: no tenderness, no masses palpated. No hepatosplenomegaly. Bowel sounds positive.  Musculoskeletal: no clubbing / cyanosis. No joint deformity upper and lower extremities. Good ROM, no contractures. Normal muscle tone.  Skin: no rashes, lesions, ulcers. No induration Neurologic: CN 2-12 grossly intact. Sensation intact, DTR normal. Strength 5/5 in all 4.  Psychiatric: Normal judgment and insight. Alert and oriented x 3. Normal mood.    Labs on Admission: I have personally reviewed following labs and imaging studies  CBC: Recent Labs  Lab 01/28/21 1959 01/29/21 0647 01/30/21 0355 01/31/21 0410 02/01/21 1005  WBC 9.0 8.8 11.2* 12.0* 21.4*  NEUTROABS 6.7  --   --   --   --   HGB 9.9* 10.5* 10.0* 11.0* 11.8*  HCT 29.8* 32.2* 29.5* 33.4* 34.6*  MCV 87.9 85.9 86.3 85.2 85.6  PLT 142* 167 166 204 [id number]   Basic Metabolic Panel: Recent Labs  Lab 01/28/21 1959 01/29/21 0647 01/30/21 0355 01/30/21 1723 01/31/21 0410 02/01/21 1005  NA 134* 132* 131*  --  131* 133*  K 4.5 4.9  3.7  --  4.3 3.8  CL 98 92* 91*  --  91* 92*  CO2 27 28 32  --  27 27  GLUCOSE 226* 394* 254* 546* 261* 139*  BUN 19 24* 34*  --  38* 41*  CREATININE 0.92 1.05* 1.15*  --  0.99 1.30*  CALCIUM 8.8* 8.6* 8.4*  --  8.6* 8.7*   GFR: Estimated Creatinine Clearance: 42.1 mL/min (A) (by C-G formula based on SCr of 1.3 mg/dL (H)). Liver Function Tests: Recent Labs  Lab 01/28/21 1959  AST 39  ALT 18  ALKPHOS 90  BILITOT 0.5  PROT 6.7  ALBUMIN 3.3*   No results for input(s): LIPASE, AMYLASE in the last 168 hours. No results for input(s): AMMONIA in the last 168 hours. Coagulation Profile: No results for input(s): INR, PROTIME in the last 168 hours. Cardiac Enzymes: No results for input(s): CKTOTAL, CKMB, CKMBINDEX, TROPONINI in  the last 168 hours. BNP (last 3 results) No results for input(s): PROBNP in the last 8760 hours. HbA1C: Recent Labs    01/30/21 0355  HGBA1C 8.6*   CBG: Recent Labs  Lab 01/30/21 2048 01/30/21 2114 01/31/21 0210 01/31/21 0815 01/31/21 1131  GLUCAP 513* 533* 278* 304* 279*   Lipid Profile: No results for input(s): CHOL, HDL, LDLCALC, TRIG, CHOLHDL, LDLDIRECT in the last 72 hours. Thyroid Function Tests: No results for input(s): TSH, T4TOTAL, FREET4, T3FREE, THYROIDAB in the last 72 hours. Anemia Panel: No results for input(s): VITAMINB12, FOLATE, FERRITIN, TIBC, IRON, RETICCTPCT in the last 72 hours. Urine analysis:    Component Value Date/Time   COLORURINE STRAW (A) 11/25/2020 0420   APPEARANCEUR CLEAR (A) 11/25/2020 0420   APPEARANCEUR Clear 11/25/2012 1335   LABSPEC 1.003 (L) 11/25/2020 0420   LABSPEC 1.017 11/25/2012 1335   PHURINE 6.0 11/25/2020 0420   GLUCOSEU 50 (A) 11/25/2020 0420   GLUCOSEU Negative 11/25/2012 1335   HGBUR SMALL (A) 11/25/2020 0420   HGBUR negative 11/11/2009 0854   BILIRUBINUR NEGATIVE 11/25/2020 0420   BILIRUBINUR Negative 11/25/2012 1335   KETONESUR NEGATIVE 11/25/2020 0420   PROTEINUR NEGATIVE 11/25/2020 0420   UROBILINOGEN 0.2 11/11/2009 0854   NITRITE NEGATIVE 11/25/2020 0420   LEUKOCYTESUR NEGATIVE 11/25/2020 0420   LEUKOCYTESUR Negative 11/25/2012 1335    Radiological Exams on Admission: NM Pulmonary Perfusion  Result Date: 01/30/2021 CLINICAL DATA:  Shortness of breath EXAM: NUCLEAR MEDICINE PERFUSION LUNG SCAN TECHNIQUE: Perfusion images were obtained in multiple projections after intravenous injection of radiopharmaceutical. Ventilation scans intentionally deferred if perfusion scan and chest x-ray adequate for interpretation during COVID 19 epidemic. RADIOPHARMACEUTICALS:  4 mCi Tc-32mMAA IV COMPARISON:  01/28/2021 FINDINGS: There is adequate uptake of radioactive tracer throughout both lungs. No focal defect to suggest  pulmonary embolism is noted. Cardiac shadow is enlarged. IMPRESSION: No focal defect to suggest pulmonary embolism. Electronically Signed   By: MInez CatalinaM.D.   On: 01/30/2021 16:07   DG Chest Port 1 View  Result Date: 02/01/2021 CLINICAL DATA:  75year old female with history of shortness of breath. COPD. EXAM: PORTABLE CHEST 1 VIEW COMPARISON:  Chest x-ray 01/30/2021. FINDINGS: Lung volumes are normal. No consolidative airspace disease. No pleural effusions. Diffuse interstitial prominence and widespread peribronchial cuffing. No pneumothorax. No definite suspicious appearing pulmonary nodules or masses are noted. No evidence of pulmonary edema. Mild cardiomegaly. Upper mediastinal contours are within normal limits. Status post hemi median sternotomy for aortic valve replacement with what appears to be a stented bioprosthesis. IMPRESSION: 1. Diffuse interstitial prominence and peribronchial  cuffing similar to prior studies suggestive of probable chronic bronchitis. 2. Cardiomegaly. Electronically Signed   By: Vinnie Langton M.D.   On: 02/01/2021 10:49   DG Chest Port 1 View  Result Date: 01/30/2021 CLINICAL DATA:  Post VQ scan EXAM: PORTABLE CHEST 1 VIEW COMPARISON:  01/28/2021, 11/24/2020, 03/04/2018 FINDINGS: Post sternotomy changes with valve replacement. Cardiomegaly with mild vascular congestion. Diffuse interstitial opacities some of which is felt due to chronic disease. No pleural effusion or pneumothorax. IMPRESSION: 1. Cardiomegaly with slight central congestion and chronic interstitial opacity. Electronically Signed   By: Donavan Foil M.D.   On: 01/30/2021 16:34    EKG: Independently reviewed. See above  Assessment/Plan SOB Multifactorial, COPD , Anxiety ,pul HTN, OSA with noncompliance -continue with treatment for COPD ,currently no wheezing on exam will continue with planned taper regimen,nebs prn and standing per protocol  CAD/s/p CABG Abn CE -above baseline , no hyperacute  ekg changes ,however patient has  Complaint of pain across lower chest , possible respiratory muscle with some strain, doubt cardiac -patient also with increase cr from baseline which may account for increase in ce from baseline  -in any event to be complete will monitor over night on tele , repeat ce to note downward trend and f/u with echo to be complete  -resume cardiac regimen as able   OSA- -noncompliant with cpap  -qhs O2 monitor on continous pulse ox   AKI -hold nephrotoxic meds -monitor labs   DMII -hold oral hypoglycemic  -place on iss/fs   Anxiety/depression/insomnia -resume home ssri /anxiolytic/ sleeping aide as able   GERD -ppi     DVT prophylaxis: lwmh Code Status: full Family Communication:  n/a Disposition Plan:patient  expected to be admitted less than 2 midnights  Consults called: n/a Admission status: obs   Clance Boll MD [facility]   If 7PM-7AM, please contact night-coverage [email or web address] Password [email or web address]  02/01/2021, 2:20 PM

## 2021-02-01 NOTE — ED Notes (Signed)
Admitting at bedside 

## 2021-02-01 NOTE — ED Triage Notes (Signed)
Pt to ED POV with husband, reports shob and chest pain that started this am. Pt grunting, crying, labored breathing in triage.  Was d/c yesterday for edema. States fluid is "squeezing heart" Pt not able to answer all questions, limited triage, constantly saying "help me I cant take this" spO2 100% on RA Prescribed prednisone yesterdya, has not started

## 2021-02-01 NOTE — ED Notes (Signed)
Pt pulled off cardiac monitor wires and walked to toilet. Pt threw up on floor, MD asked for nausea medications. Pt back in bed and on monitor. Pt informed to call for assistance.

## 2021-02-02 ENCOUNTER — Observation Stay
Admit: 2021-02-02 | Discharge: 2021-02-02 | Disposition: A | Discharge status: Home / Self Care | Payer: Medicare HMO | Attending: Internal Medicine | Admitting: Internal Medicine

## 2021-02-02 DIAGNOSIS — R0603 Acute respiratory distress: Secondary | ICD-10-CM | POA: Diagnosis not present

## 2021-02-02 DIAGNOSIS — R0602 Shortness of breath: Secondary | ICD-10-CM

## 2021-02-02 LAB — COMPREHENSIVE METABOLIC PANEL
ALT: 30 U/L (ref 0–44)
AST: 41 U/L (ref 15–41)
Albumin: 3.3 g/dL — ABNORMAL LOW (ref 3.5–5.0)
Alkaline Phosphatase: 81 U/L (ref 38–126)
Anion gap: 8 (ref 5–15)
BUN: 43 mg/dL — ABNORMAL HIGH (ref 8–23)
CO2: 28 mmol/L (ref 22–32)
Calcium: 8.3 mg/dL — ABNORMAL LOW (ref 8.9–10.3)
Chloride: 93 mmol/L — ABNORMAL LOW (ref 98–111)
Creatinine, Ser: 1.26 mg/dL — ABNORMAL HIGH (ref 0.44–1.00)
GFR, Estimated: 44 mL/min — ABNORMAL LOW (ref >60)
Glucose, Bld: 444 mg/dL — ABNORMAL HIGH (ref 70–99)
Potassium: 5.2 mmol/L — ABNORMAL HIGH (ref 3.5–5.1)
Sodium: 129 mmol/L — ABNORMAL LOW (ref 135–145)
Total Bilirubin: 0.4 mg/dL (ref 0.3–1.2)
Total Protein: 6.6 g/dL (ref 6.5–8.1)

## 2021-02-02 LAB — GLUCOSE, CAPILLARY
Glucose-Capillary: 333 mg/dL — ABNORMAL HIGH (ref 70–99)
Glucose-Capillary: 304 mg/dL — ABNORMAL HIGH (ref 70–99)
Glucose-Capillary: 224 mg/dL — ABNORMAL HIGH (ref 70–99)
Glucose-Capillary: 145 mg/dL — ABNORMAL HIGH (ref 70–99)

## 2021-02-02 LAB — CBC
HCT: 31.8 % — ABNORMAL LOW (ref 36.0–46.0)
Hemoglobin: 10.8 g/dL — ABNORMAL LOW (ref 12.0–15.0)
MCH: 29.2 pg (ref 26.0–34.0)
MCHC: 34 g/dL (ref 30.0–36.0)
MCV: 85.9 fL (ref 80.0–100.0)
Platelets: 168 10*3/uL (ref 150–400)
RBC: 3.7 MIL/uL — ABNORMAL LOW (ref 3.87–5.11)
RDW: 13.5 % (ref 11.5–15.5)
WBC: 10.4 10*3/uL (ref 4.0–10.5)
nRBC: 0 % (ref 0.0–0.2)

## 2021-02-02 LAB — ECHOCARDIOGRAM COMPLETE
Height: 66 in
S' Lateral: 3.1 cm
Weight: 3248 oz

## 2021-02-02 LAB — PROCALCITONIN: Procalcitonin: 0.13 ng/mL

## 2021-02-02 MED ORDER — SODIUM CHLORIDE 0.9 % IV BOLUS
500.0000 mL | Freq: Once | INTRAVENOUS | Status: AC
  Administered 2021-02-02: 500 mL via INTRAVENOUS

## 2021-02-02 MED ORDER — INSULIN ASPART 100 UNIT/ML IJ SOLN
15.0000 [IU] | Freq: Three times a day (TID) | INTRAMUSCULAR | Status: DC
  Administered 2021-02-02 (×3): 15 [IU] via SUBCUTANEOUS
  Filled 2021-02-02 (×2): qty 1

## 2021-02-02 MED ORDER — HYDROXYZINE HCL 25 MG PO TABS
25.0000 mg | ORAL_TABLET | Freq: Three times a day (TID) | ORAL | Status: DC
  Administered 2021-02-02: 25 mg via ORAL
  Filled 2021-02-02 (×2): qty 1

## 2021-02-02 MED ORDER — HYDROXYZINE HCL 25 MG PO TABS: 25.0000 mg | ORAL_TABLET | Freq: Three times a day (TID) | ORAL | 0 refills | Status: AC

## 2021-02-02 MED ORDER — ALPRAZOLAM 0.25 MG PO TABS
0.2500 mg | ORAL_TABLET | Freq: Once | ORAL | Status: AC
  Administered 2021-02-02: 0.25 mg via ORAL
  Filled 2021-02-02 (×2): qty 1

## 2021-02-02 MED ORDER — INSULIN ASPART 100 UNIT/ML IJ SOLN
0.0000 [IU] | Freq: Three times a day (TID) | INTRAMUSCULAR | Status: DC
  Administered 2021-02-02: 15 [IU] via SUBCUTANEOUS
  Administered 2021-02-02: 3 [IU] via SUBCUTANEOUS
  Administered 2021-02-02: 7 [IU] via SUBCUTANEOUS
  Filled 2021-02-02 (×3): qty 1

## 2021-02-02 NOTE — Discharge Summary (Signed)
Physician Discharge Summary  [name] [id number] DOB: [birth year] DOA: 02/01/2021  PCP: Megan Aus, MD  Admit date: 02/01/2021 Discharge date: 02/02/2021  Admitted From: Home Disposition: Home  Recommendations for Outpatient Follow-up:  Follow up with PCP in 1 week Please obtain BMP/CBC in one week Please follow up on the following pending results: Transthoracic Echocardiogram  Home Health: None Equipment/Devices: None  Discharge Condition: Stable CODE STATUS: Full code Diet recommendation: Heart healthy   Brief/Interim Summary:  Admission HPI written by Clance Boll, MD   HPI: [name] is a 75 y.o. female with medical history significant of  75 yo with hx of CABG 2015 , depression,anxiety, hypertension, OSA, COPD, iron deficiency anemia, aortic dilatation, presents emergency department with sob less than 24 hours after discharge s/p treatment for COPD exacerbation.  Patient states sob started this am around 9 am . No associated cough but noted pain across her lower chest described as pressure. Patient notes currently this has has improved notes a heavy discomfort in that area.  She notes shortness of breath as completely resolved.  Patient also endorse feeling of nausea and episode of emesis which she had in ED.  She does also does endorse lower stomach pain but denies diarrhea or dysuria.  Patient notes chills but no fever.    Hospital course:  Respiratory distress Initial concern for COPD exacerbation with patient requiring non-rebreather in the ED. There was some mild pulmonary edema on chest x-ray. Patient weaned to room air at rest. Started on Atarax for concern her respiratory symptoms are precipitated by significant anxiety. She ambulated well without oxygen and no dyspnea symptoms. Transthoracic Echocardiogram was obtained but pending at time of discharge; outpatient follow-up. Steroids discontinued.  Diabetes mellitus Slightly elevated blood sugar  in setting of steroid dosing. Continue home regimen but discontinued glipizide.  Discharge Diagnoses:  Active Problems:   SOB (shortness of breath)    Discharge Instructions   Allergies as of 02/02/2021       Reactions   Amitriptyline Other (See Comments)   Unknown reaction   Benadryl [diphenhydramine] Shortness Of Breath   Demerol [meperidine] Other (See Comments)   Unknown reaction   Gabapentin Other (See Comments)   Unknown reaction   Meperidine Hcl Other (See Comments)   Unknown reaction   Mirtazapine Other (See Comments)   Unknown reaction   Olanzapine Other (See Comments)   Unknown reaction   Voltaren [diclofenac Sodium] Shortness Of Breath   Zetia [ezetimibe] Other (See Comments)   Weakness in legs, shakiness all over   Ativan [lorazepam] Other (See Comments)   Causes double vision at highter than .5 mg dose   Atorvastatin Other (See Comments)   Muscle aches and weakness   Budesonide-formoterol Fumarate Other (See Comments)   Shakiness, tremors   Bupropion Hcl Other (See Comments)   "cloud over me" depression   Caffeine Other (See Comments)   jitters   Codeine Sulfate Other (See Comments)   Makes chest hurt like a heart attack   Lisinopril Cough   Metformin Nausea And Vomiting   Mometasone Furoate Nausea And Vomiting   Morphine Sulfate Other (See Comments)   Chest pain like a heart attack   Other Other (See Comments)   Beta Blockers, reaction shortness of breath   Oxycodone-acetaminophen Nausea And Vomiting   Pioglitazone Other (See Comments)   Cannot take because of risk of bladder cancer   Propoxyphene N-acetaminophen Nausea And Vomiting   Rosuvastatin Other (See Comments)  Muscle aches and weakness   Shellfish Allergy Diarrhea   Suvorexant Other (See Comments)   Jerking/nervous    Ticagrelor    Other reaction(s): Other (See Comments) "slowed heart rate" & chest pain   Tramadol Nausea Only   Trazodone And Nefazodone Nausea And Vomiting    Venlafaxine Other (See Comments)   Unknown reaction   Zolpidem Tartrate Other (See Comments)    Jittery, diarrhea   Latex Rash        Medication List     STOP taking these medications    furosemide 20 MG tablet Commonly known as: Lasix   glipiZIDE 10 MG 24 hr tablet Commonly known as: GLUCOTROL XL   predniSONE 10 MG tablet Commonly known as: DELTASONE       TAKE these medications    acetaminophen 500 MG tablet Commonly known as: TYLENOL Take 500-1,000 mg by mouth every 6 (six) hours as needed (pain).   albuterol (2.5 MG/3ML) 0.083% nebulizer solution Commonly known as: PROVENTIL Take 2.5 mg by nebulization every 4 (four) hours as needed for shortness of breath or wheezing.   Anoro Ellipta 62.5-25 MCG/INH Aepb Generic drug: umeclidinium-vilanterol Inhale 1 puff into the lungs daily.   aspirin 81 MG EC tablet Take 1 tablet (81 mg total) by mouth daily. Swallow whole.   CALTRATE 600 PLUS-VIT D PO Take 1 tablet by mouth 2 (two) times daily.   clopidogrel 75 MG tablet Commonly known as: PLAVIX Take 75 mg by mouth daily.   DULoxetine 20 MG capsule Commonly known as: CYMBALTA Take 1 capsule (20 mg total) by mouth in the morning.   FLUoxetine 40 MG capsule Commonly known as: PROZAC Take 40 mg by mouth 2 (two) times daily.   hydrOXYzine 25 MG tablet Commonly known as: ATARAX/VISTARIL Take 1 tablet (25 mg total) by mouth 3 (three) times daily.   insulin lispro 100 UNIT/ML injection Commonly known as: HUMALOG Inject 0-54 Units into the skin 4 (four) times daily -  before meals and at bedtime.   insulin NPH-regular Human (70-30) 100 UNIT/ML injection Inject 60 Units into the skin 2 (two) times daily with a meal. Inject 60u under the skin every morning at breakfast and inject 60u under the skin every evening with supper   isosorbide mononitrate 30 MG 24 hr tablet Commonly known as: IMDUR Take 1 tablet (30 mg total) by mouth daily.   levothyroxine 125 MCG  tablet Commonly known as: SYNTHROID Take 125 mcg by mouth daily before breakfast.   losartan 25 MG tablet Commonly known as: COZAAR Take 1 tablet (25 mg total) by mouth in the morning.   nystatin 100000 UNIT/ML suspension Commonly known as: MYCOSTATIN Take by mouth.   pantoprazole 40 MG tablet Commonly known as: PROTONIX Take 40 mg by mouth in the morning.   potassium chloride 10 MEQ tablet Commonly known as: KLOR-CON Take 1 tablet (10 mEq total) by mouth daily.   promethazine 25 MG tablet Commonly known as: PHENERGAN Take 25 mg by mouth every 6 (six) hours as needed for nausea or vomiting.   Semglee 100 UNIT/ML injection Generic drug: insulin glargine Inject 100 Units into the skin at bedtime.   torsemide 20 MG tablet Commonly known as: DEMADEX Take 20 mg by mouth daily.   traZODone 100 MG tablet Commonly known as: DESYREL Take 200 mg by mouth at bedtime.        Allergies  Allergen Reactions   Amitriptyline Other (See Comments)    Unknown reaction   Benadryl [  Diphenhydramine] Shortness Of Breath   Demerol [Meperidine] Other (See Comments)    Unknown reaction   Gabapentin Other (See Comments)    Unknown reaction   Meperidine Hcl Other (See Comments)    Unknown reaction   Mirtazapine Other (See Comments)    Unknown reaction   Olanzapine Other (See Comments)    Unknown reaction    Voltaren [Diclofenac Sodium] Shortness Of Breath   Zetia [Ezetimibe] Other (See Comments)    Weakness in legs, shakiness all over   Ativan [Lorazepam] Other (See Comments)    Causes double vision at highter than .5 mg dose   Atorvastatin Other (See Comments)    Muscle aches and weakness   Budesonide-Formoterol Fumarate Other (See Comments)    Shakiness, tremors   Bupropion Hcl Other (See Comments)    "cloud over me" depression   Caffeine Other (See Comments)    jitters   Codeine Sulfate Other (See Comments)    Makes chest hurt like a heart attack   Lisinopril Cough    Metformin Nausea And Vomiting   Mometasone Furoate Nausea And Vomiting   Morphine Sulfate Other (See Comments)    Chest pain like a heart attack   Other Other (See Comments)    Beta Blockers, reaction shortness of breath   Oxycodone-Acetaminophen Nausea And Vomiting   Pioglitazone Other (See Comments)    Cannot take because of risk of bladder cancer   Propoxyphene N-Acetaminophen Nausea And Vomiting   Rosuvastatin Other (See Comments)    Muscle aches and weakness   Shellfish Allergy Diarrhea   Suvorexant Other (See Comments)    Jerking/nervous    Ticagrelor     Other reaction(s): Other (See Comments) "slowed heart rate" & chest pain   Tramadol Nausea Only   Trazodone And Nefazodone Nausea And Vomiting   Venlafaxine Other (See Comments)    Unknown reaction   Zolpidem Tartrate Other (See Comments)     Jittery, diarrhea   Latex Rash    Consultations: None   Procedures/Studies: CT CHEST WO CONTRAST  Result Date: 02/01/2021 CLINICAL DATA:  COPD exacerbation shob and chest pain that started this arm. EXAM: CT CHEST WITHOUT CONTRAST TECHNIQUE: Multidetector CT imaging of the chest was performed following the standard protocol without IV contrast. COMPARISON:  CT angio chest 08/08/2020, CT angiography chest 06/15/2020 FINDINGS: Cardiovascular: Normal heart size. Status post aortic valve replacement. No significant pericardial effusion. The thoracic aorta is normal in caliber. At least moderate atherosclerotic plaque of the thoracic aorta. Four-vessel coronary artery calcifications status post coronary artery stents. The main pulmonary artery is enlarged in caliber measuring up to 3.5 cm. Mediastinum/Nodes: No gross hilar adenopathy, noting limited sensitivity for the detection of hilar adenopathy on this noncontrast study. No enlarged mediastinal or axillary lymph nodes. Nonspecific debris noted within the esophagus. The thyroid and trachea demonstrate no significant findings. Lungs/Pleura:  No focal consolidation. Interval decrease in almost resolved patchy ground-glass airspace opacities with a persistent peribronchovascular nodular-like 1 cm right upper lobe lesion (3:54, 5:78). Interval development of a 1.3 cm ground-glass airspace opacity at the right apex (3:17, 5:104). Trace persistent interlobular septal wall thickening. No pulmonary mass. No pleural effusion. No pneumothorax. Upper Abdomen: Status post cholecystectomy.  No acute abnormality. Musculoskeletal: No chest wall abnormality. No suspicious lytic or blastic osseous lesions. No acute displaced fracture. Sternotomy wires are noted. IMPRESSION: 1. Interval development of a 1.3 cm ground-glass airspace opacity at the right apex. Persistent peribronchovascular nodular like 1 cm right upper lobe ground-glass airspace  opacity. Adenocarcinoma cannot be excluded. Follow up by CT is recommended in 12 months, with continued annual surveillance for a minimum of 3 years. These recommendations are taken from: Recommendations for the Management of Subsolid Pulmonary Nodules Detected at CT: A Statement from the [facility] 2013; 266:1, 304-317. 2. Enlarged main pulmonary artery suggestive of hypertension. 3. Debris noted within the esophagus. 4. Aortic Atherosclerosis (ICD10-I70.0) including four-vessel coronary artery calcifications status post stent. 5. Status aortic valve replacement. Electronically Signed   By: Iven Finn M.D.   On: 02/01/2021 17:29   NM Pulmonary Perfusion  Result Date: 01/30/2021 CLINICAL DATA:  Shortness of breath EXAM: NUCLEAR MEDICINE PERFUSION LUNG SCAN TECHNIQUE: Perfusion images were obtained in multiple projections after intravenous injection of radiopharmaceutical. Ventilation scans intentionally deferred if perfusion scan and chest x-ray adequate for interpretation during COVID 19 epidemic. RADIOPHARMACEUTICALS:  4 mCi Tc-28mMAA IV COMPARISON:  01/28/2021 FINDINGS: There is adequate uptake of  radioactive tracer throughout both lungs. No focal defect to suggest pulmonary embolism is noted. Cardiac shadow is enlarged. IMPRESSION: No focal defect to suggest pulmonary embolism. Electronically Signed   By: MInez CatalinaM.D.   On: 01/30/2021 16:07   DG Chest Port 1 View  Result Date: 02/01/2021 CLINICAL DATA:  75year old female with history of shortness of breath. COPD. EXAM: PORTABLE CHEST 1 VIEW COMPARISON:  Chest x-ray 01/30/2021. FINDINGS: Lung volumes are normal. No consolidative airspace disease. No pleural effusions. Diffuse interstitial prominence and widespread peribronchial cuffing. No pneumothorax. No definite suspicious appearing pulmonary nodules or masses are noted. No evidence of pulmonary edema. Mild cardiomegaly. Upper mediastinal contours are within normal limits. Status post hemi median sternotomy for aortic valve replacement with what appears to be a stented bioprosthesis. IMPRESSION: 1. Diffuse interstitial prominence and peribronchial cuffing similar to prior studies suggestive of probable chronic bronchitis. 2. Cardiomegaly. Electronically Signed   By: DVinnie LangtonM.D.   On: 02/01/2021 10:49   DG Chest Port 1 View  Result Date: 01/30/2021 CLINICAL DATA:  Post VQ scan EXAM: PORTABLE CHEST 1 VIEW COMPARISON:  01/28/2021, 11/24/2020, 03/04/2018 FINDINGS: Post sternotomy changes with valve replacement. Cardiomegaly with mild vascular congestion. Diffuse interstitial opacities some of which is felt due to chronic disease. No pleural effusion or pneumothorax. IMPRESSION: 1. Cardiomegaly with slight central congestion and chronic interstitial opacity. Electronically Signed   By: KDonavan FoilM.D.   On: 01/30/2021 16:34   DG Chest Port 1 View  Result Date: 01/28/2021 CLINICAL DATA:  Difficulty breathing.  Shortness of breath. EXAM: PORTABLE CHEST 1 VIEW COMPARISON:  November 24, 2020 FINDINGS: Stable cardiomegaly. The hila and mediastinum are normal. No pneumothorax. No nodules or  masses. No focal infiltrates. Increased interstitial opacities in the lungs suggesting mild edema. IMPRESSION: Cardiomegaly and mild pulmonary edema. No other acute abnormalities. Electronically Signed   By: DDorise BullionIII M.D.   On: 01/28/2021 19:17      Subjective: No dyspnea this morning.  Discharge Exam: Vitals:   02/01/21 2049 02/02/21 0228  BP: (!) 142/72 (!) 141/69  Pulse: 76 72  Resp: 20 20  Temp: 97.6 F (36.4 C) 97.8 F (36.6 C)  SpO2: 100% 96%   Vitals:   02/01/21 1500 02/01/21 1949 02/01/21 2049 02/02/21 0228  BP: 130/83 135/74 (!) 142/72 (!) 141/69  Pulse: 95 82 76 72  Resp: '14 17 20 20  '$ Temp:   97.6 F (36.4 C) 97.8 F (36.6 C)  TempSrc:   Oral   SpO2: 100% 99% 100% 96%  Weight:      Height:        General: Pt is alert, awake, not in acute distress Cardiovascular: RRR, S1/S2 +, no rubs, no gallops Respiratory: CTA bilaterally, no wheezing, no rhonchi Abdominal: Soft, NT, ND, bowel sounds + Extremities: no edema, no cyanosis    The results of significant diagnostics from this hospitalization (including imaging, microbiology, ancillary and laboratory) are listed below for reference.     Microbiology: Recent Results (from the past 240 hour(s))  Resp Panel by RT-PCR (Flu A&B, Covid) Nasopharyngeal Swab     Status: None   Collection Time: 01/28/21  9:25 PM   Specimen: Nasopharyngeal Swab; Nasopharyngeal(NP) swabs in vial transport medium  Result Value Ref Range Status   SARS Coronavirus 2 by RT PCR NEGATIVE NEGATIVE Final    Comment: (NOTE) SARS-CoV-2 target nucleic acids are NOT DETECTED.  The SARS-CoV-2 RNA is generally detectable in upper respiratory specimens during the acute phase of infection. The lowest concentration of SARS-CoV-2 viral copies this assay can detect is 138 copies/mL. A negative result does not preclude SARS-Cov-2 infection and should not be used as the sole basis for treatment or other patient management decisions. A  negative result may occur with  improper specimen collection/handling, submission of specimen other than nasopharyngeal swab, presence of viral mutation(s) within the areas targeted by this assay, and inadequate number of viral copies(<138 copies/mL). A negative result must be combined with clinical observations, patient history, and epidemiological information. The expected result is Negative.  Fact Sheet for Patients:  EntrepreneurPulse.com.au  Fact Sheet for Healthcare Providers:  IncredibleEmployment.be  This test is no t yet approved or cleared by the [place] FDA and  has been authorized for detection and/or diagnosis of SARS-CoV-2 by FDA under an Emergency Use Authorization (EUA). This EUA will remain  in effect (meaning this test can be used) for the duration of the COVID-19 declaration under Section 564(b)(1) of the Act, 21 U.S.C.section 360bbb-3(b)(1), unless the authorization is terminated  or revoked sooner.       Influenza A by PCR NEGATIVE NEGATIVE Final   Influenza B by PCR NEGATIVE NEGATIVE Final    Comment: (NOTE) The Xpert Xpress SARS-CoV-2/FLU/RSV plus assay is intended as an aid in the diagnosis of influenza from Nasopharyngeal swab specimens and should not be used as a sole basis for treatment. Nasal washings and aspirates are unacceptable for Xpert Xpress SARS-CoV-2/FLU/RSV testing.  Fact Sheet for Patients: EntrepreneurPulse.com.au  Fact Sheet for Healthcare Providers: IncredibleEmployment.be  This test is not yet approved or cleared by the [place] FDA and has been authorized for detection and/or diagnosis of SARS-CoV-2 by FDA under an Emergency Use Authorization (EUA). This EUA will remain in effect (meaning this test can be used) for the duration of the COVID-19 declaration under Section 564(b)(1) of the Act, 21 U.S.C. section 360bbb-3(b)(1), unless the authorization  is terminated or revoked.  Performed at [facility], [address] [place], [place] [address]   Resp Panel by RT-PCR (Flu A&B, Covid) Nasopharyngeal Swab     Status: None   Collection Time: 02/01/21 12:10 PM   Specimen: Nasopharyngeal Swab; Nasopharyngeal(NP) swabs in vial transport medium  Result Value Ref Range Status   SARS Coronavirus 2 by RT PCR NEGATIVE NEGATIVE Final    Comment: (NOTE) SARS-CoV-2 target nucleic acids are NOT DETECTED.  The SARS-CoV-2 RNA is generally detectable in upper respiratory specimens during the acute phase of infection. The lowest concentration of SARS-CoV-2 viral copies this assay can  detect is 138 copies/mL. A negative result does not preclude SARS-Cov-2 infection and should not be used as the sole basis for treatment or other patient management decisions. A negative result may occur with  improper specimen collection/handling, submission of specimen other than nasopharyngeal swab, presence of viral mutation(s) within the areas targeted by this assay, and inadequate number of viral copies(<138 copies/mL). A negative result must be combined with clinical observations, patient history, and epidemiological information. The expected result is Negative.  Fact Sheet for Patients:  EntrepreneurPulse.com.au  Fact Sheet for Healthcare Providers:  IncredibleEmployment.be  This test is no t yet approved or cleared by the [place] FDA and  has been authorized for detection and/or diagnosis of SARS-CoV-2 by FDA under an Emergency Use Authorization (EUA). This EUA will remain  in effect (meaning this test can be used) for the duration of the COVID-19 declaration under Section 564(b)(1) of the Act, 21 U.S.C.section 360bbb-3(b)(1), unless the authorization is terminated  or revoked sooner.       Influenza A by PCR NEGATIVE NEGATIVE Final   Influenza B by PCR NEGATIVE NEGATIVE Final    Comment:  (NOTE) The Xpert Xpress SARS-CoV-2/FLU/RSV plus assay is intended as an aid in the diagnosis of influenza from Nasopharyngeal swab specimens and should not be used as a sole basis for treatment. Nasal washings and aspirates are unacceptable for Xpert Xpress SARS-CoV-2/FLU/RSV testing.  Fact Sheet for Patients: EntrepreneurPulse.com.au  Fact Sheet for Healthcare Providers: IncredibleEmployment.be  This test is not yet approved or cleared by the [place] FDA and has been authorized for detection and/or diagnosis of SARS-CoV-2 by FDA under an Emergency Use Authorization (EUA). This EUA will remain in effect (meaning this test can be used) for the duration of the COVID-19 declaration under Section 564(b)(1) of the Act, 21 U.S.C. section 360bbb-3(b)(1), unless the authorization is terminated or revoked.  Performed at [facility], [address] [place], [place] [address]      Labs: BNP (last 3 results) Recent Labs    11/24/20 1645 01/28/21 1959 02/01/21 1005  BNP 169.3* 268.6* [id number]*   Basic Metabolic Panel: Recent Labs  Lab 01/29/21 0647 01/30/21 0355 01/30/21 1723 01/31/21 0410 02/01/21 1005 02/01/21 1748 02/02/21 0359  NA 132* 131*  --  131* 133*  --  129*  K 4.9 3.7  --  4.3 3.8  --  5.2*  CL 92* 91*  --  91* 92*  --  93*  CO2 28 32  --  27 27  --  28  GLUCOSE 394* 254* 546* 261* 139*  --  444*  BUN 24* 34*  --  38* 41*  --  43*  CREATININE 1.05* 1.15*  --  0.99 1.30* 1.38* 1.26*  CALCIUM 8.6* 8.4*  --  8.6* 8.7*  --  8.3*   Liver Function Tests: Recent Labs  Lab 01/28/21 1959 02/02/21 0359  AST 39 41  ALT 18 30  ALKPHOS 90 81  BILITOT 0.5 0.4  PROT 6.7 6.6  ALBUMIN 3.3* 3.3*   No results for input(s): LIPASE, AMYLASE in the last 168 hours. No results for input(s): AMMONIA in the last 168 hours. CBC: Recent Labs  Lab 01/28/21 1959 01/29/21 0647 01/30/21 0355 01/31/21 0410 02/01/21 1005  02/01/21 1748 02/02/21 0359  WBC 9.0   < > 11.2* 12.0* 21.4* 10.6* 10.4  NEUTROABS 6.7  --   --   --   --   --   --   HGB 9.9*   < >  10.0* 11.0* 11.8* 10.8* 10.8*  HCT 29.8*   < > 29.5* 33.4* 34.6* 31.6* 31.8*  MCV 87.9   < > 86.3 85.2 85.6 86.3 85.9  PLT 142*   < > 166 204 265 167 168   < > = values in this interval not displayed.   Cardiac Enzymes: No results for input(s): CKTOTAL, CKMB, CKMBINDEX, TROPONINI in the last 168 hours. BNP: Invalid input(s): POCBNP CBG: Recent Labs  Lab 01/30/21 2114 01/31/21 0210 01/31/21 0815 01/31/21 1131 02/02/21 0637  GLUCAP 533* 278* 304* 279* 333*   D-Dimer No results for input(s): DDIMER in the last 72 hours. Hgb A1c No results for input(s): HGBA1C in the last 72 hours. Lipid Profile No results for input(s): CHOL, HDL, LDLCALC, TRIG, CHOLHDL, LDLDIRECT in the last 72 hours. Thyroid function studies Recent Labs    02/01/21 1748  TSH 7.959*   Anemia work up No results for input(s): VITAMINB12, FOLATE, FERRITIN, TIBC, IRON, RETICCTPCT in the last 72 hours. Urinalysis    Component Value Date/Time   COLORURINE STRAW (A) 11/25/2020 0420   APPEARANCEUR CLEAR (A) 11/25/2020 0420   APPEARANCEUR Clear 11/25/2012 1335   LABSPEC 1.003 (L) 11/25/2020 0420   LABSPEC 1.017 11/25/2012 1335   PHURINE 6.0 11/25/2020 0420   GLUCOSEU 50 (A) 11/25/2020 0420   GLUCOSEU Negative 11/25/2012 1335   HGBUR SMALL (A) 11/25/2020 0420   HGBUR negative 11/11/2009 0854   BILIRUBINUR NEGATIVE 11/25/2020 0420   BILIRUBINUR Negative 11/25/2012 1335   KETONESUR NEGATIVE 11/25/2020 0420   PROTEINUR NEGATIVE 11/25/2020 0420   UROBILINOGEN 0.2 11/11/2009 0854   NITRITE NEGATIVE 11/25/2020 0420   LEUKOCYTESUR NEGATIVE 11/25/2020 0420   LEUKOCYTESUR Negative 11/25/2012 1335   Sepsis Labs Invalid input(s): PROCALCITONIN,  WBC,  LACTICIDVEN Microbiology Recent Results (from the past 240 hour(s))  Resp Panel by RT-PCR (Flu A&B, Covid) Nasopharyngeal Swab      Status: None   Collection Time: 01/28/21  9:25 PM   Specimen: Nasopharyngeal Swab; Nasopharyngeal(NP) swabs in vial transport medium  Result Value Ref Range Status   SARS Coronavirus 2 by RT PCR NEGATIVE NEGATIVE Final    Comment: (NOTE) SARS-CoV-2 target nucleic acids are NOT DETECTED.  The SARS-CoV-2 RNA is generally detectable in upper respiratory specimens during the acute phase of infection. The lowest concentration of SARS-CoV-2 viral copies this assay can detect is 138 copies/mL. A negative result does not preclude SARS-Cov-2 infection and should not be used as the sole basis for treatment or other patient management decisions. A negative result may occur with  improper specimen collection/handling, submission of specimen other than nasopharyngeal swab, presence of viral mutation(s) within the areas targeted by this assay, and inadequate number of viral copies(<138 copies/mL). A negative result must be combined with clinical observations, patient history, and epidemiological information. The expected result is Negative.  Fact Sheet for Patients:  EntrepreneurPulse.com.au  Fact Sheet for Healthcare Providers:  IncredibleEmployment.be  This test is no t yet approved or cleared by the [place] FDA and  has been authorized for detection and/or diagnosis of SARS-CoV-2 by FDA under an Emergency Use Authorization (EUA). This EUA will remain  in effect (meaning this test can be used) for the duration of the COVID-19 declaration under Section 564(b)(1) of the Act, 21 U.S.C.section 360bbb-3(b)(1), unless the authorization is terminated  or revoked sooner.       Influenza A by PCR NEGATIVE NEGATIVE Final   Influenza B by PCR NEGATIVE NEGATIVE Final    Comment: (NOTE) The Xpert Xpress  SARS-CoV-2/FLU/RSV plus assay is intended as an aid in the diagnosis of influenza from Nasopharyngeal swab specimens and should not be used as a sole basis  for treatment. Nasal washings and aspirates are unacceptable for Xpert Xpress SARS-CoV-2/FLU/RSV testing.  Fact Sheet for Patients: EntrepreneurPulse.com.au  Fact Sheet for Healthcare Providers: IncredibleEmployment.be  This test is not yet approved or cleared by the [place] FDA and has been authorized for detection and/or diagnosis of SARS-CoV-2 by FDA under an Emergency Use Authorization (EUA). This EUA will remain in effect (meaning this test can be used) for the duration of the COVID-19 declaration under Section 564(b)(1) of the Act, 21 U.S.C. section 360bbb-3(b)(1), unless the authorization is terminated or revoked.  Performed at [facility], [address] [place], [place] [address]   Resp Panel by RT-PCR (Flu A&B, Covid) Nasopharyngeal Swab     Status: None   Collection Time: 02/01/21 12:10 PM   Specimen: Nasopharyngeal Swab; Nasopharyngeal(NP) swabs in vial transport medium  Result Value Ref Range Status   SARS Coronavirus 2 by RT PCR NEGATIVE NEGATIVE Final    Comment: (NOTE) SARS-CoV-2 target nucleic acids are NOT DETECTED.  The SARS-CoV-2 RNA is generally detectable in upper respiratory specimens during the acute phase of infection. The lowest concentration of SARS-CoV-2 viral copies this assay can detect is 138 copies/mL. A negative result does not preclude SARS-Cov-2 infection and should not be used as the sole basis for treatment or other patient management decisions. A negative result may occur with  improper specimen collection/handling, submission of specimen other than nasopharyngeal swab, presence of viral mutation(s) within the areas targeted by this assay, and inadequate number of viral copies(<138 copies/mL). A negative result must be combined with clinical observations, patient history, and epidemiological information. The expected result is Negative.  Fact Sheet for Patients:   EntrepreneurPulse.com.au  Fact Sheet for Healthcare Providers:  IncredibleEmployment.be  This test is no t yet approved or cleared by the [place] FDA and  has been authorized for detection and/or diagnosis of SARS-CoV-2 by FDA under an Emergency Use Authorization (EUA). This EUA will remain  in effect (meaning this test can be used) for the duration of the COVID-19 declaration under Section 564(b)(1) of the Act, 21 U.S.C.section 360bbb-3(b)(1), unless the authorization is terminated  or revoked sooner.       Influenza A by PCR NEGATIVE NEGATIVE Final   Influenza B by PCR NEGATIVE NEGATIVE Final    Comment: (NOTE) The Xpert Xpress SARS-CoV-2/FLU/RSV plus assay is intended as an aid in the diagnosis of influenza from Nasopharyngeal swab specimens and should not be used as a sole basis for treatment. Nasal washings and aspirates are unacceptable for Xpert Xpress SARS-CoV-2/FLU/RSV testing.  Fact Sheet for Patients: EntrepreneurPulse.com.au  Fact Sheet for Healthcare Providers: IncredibleEmployment.be  This test is not yet approved or cleared by the [place] FDA and has been authorized for detection and/or diagnosis of SARS-CoV-2 by FDA under an Emergency Use Authorization (EUA). This EUA will remain in effect (meaning this test can be used) for the duration of the COVID-19 declaration under Section 564(b)(1) of the Act, 21 U.S.C. section 360bbb-3(b)(1), unless the authorization is terminated or revoked.  Performed at [facility], [address] [place], [place] [address]     SIGNED:   Cordelia Poche, MD [facility] 02/02/2021, 7:37 AM

## 2021-02-02 NOTE — Discharge Instructions (Signed)
[name],  You were in the hospital with a presumed COPD exacerbation your symptoms appear to be related to anxiety. I have prescribed hydroxyzine to help with your anxiety. Please follow-up with your PCP.

## 2021-02-02 NOTE — Progress Notes (Signed)
Attempted to evaluate patients oxygen saturation while ambulating. Patient was already sitting at bedside on RA. Results listed below:   Sitting: HR: 72 RR- 20 O2 sat on RA- 99    Standing <102mn HR-71 RR-26 RA-100  *patient observed to be in respiratory distress while standing. Patient exhibited extreme expiratory stridor with labored shortness of breath in less than 1 min. while standing.Patient proceeded to sit down on bed due to intolerance of activity. Patient assisted to lying position with HOB elevated at 90 degrees, becoming less dyspneic.   Patient currently in bed resting, O2 saturation WNL. call bell in reach.

## 2021-02-02 NOTE — Progress Notes (Addendum)
Patient arrived on unit from ED alert, oriented and very anxious. No resp distress noted. Vital signs stable, settled into room, assessment completed. Patient requested additional medication to help her relax. Xanax given with positive effect. Spoke to husband and he would like to have patients team prescribe her medication upon discharge to help her maintain a lower level of anxiety. Will continue to monitor. Patient without acute respiratory distress tonight. Slept well.

## 2021-02-02 NOTE — Progress Notes (Signed)
*  PRELIMINARY RESULTS* Echocardiogram 2D Echocardiogram has been performed.  [name] 02/02/2021, 10:10 AM

## 2021-02-05 LAB — URINE CULTURE: Culture: >=100000 — AB

## 2021-02-07 ENCOUNTER — Other Ambulatory Visit: Payer: Self-pay

## 2021-02-07 ENCOUNTER — Encounter: Payer: Self-pay | Admitting: Emergency Medicine

## 2021-02-07 ENCOUNTER — Emergency Department: Payer: Medicare HMO

## 2021-02-07 DIAGNOSIS — E890 Postprocedural hypothyroidism: Secondary | ICD-10-CM | POA: Diagnosis present

## 2021-02-07 DIAGNOSIS — Z79890 Hormone replacement therapy (postmenopausal): Secondary | ICD-10-CM

## 2021-02-07 DIAGNOSIS — G4733 Obstructive sleep apnea (adult) (pediatric): Secondary | ICD-10-CM | POA: Diagnosis present

## 2021-02-07 DIAGNOSIS — F32A Depression, unspecified: Secondary | ICD-10-CM | POA: Diagnosis present

## 2021-02-07 DIAGNOSIS — E785 Hyperlipidemia, unspecified: Secondary | ICD-10-CM | POA: Diagnosis present

## 2021-02-07 DIAGNOSIS — D638 Anemia in other chronic diseases classified elsewhere: Secondary | ICD-10-CM | POA: Diagnosis present

## 2021-02-07 DIAGNOSIS — Z7902 Long term (current) use of antithrombotics/antiplatelets: Secondary | ICD-10-CM

## 2021-02-07 DIAGNOSIS — I350 Nonrheumatic aortic (valve) stenosis: Secondary | ICD-10-CM | POA: Diagnosis present

## 2021-02-07 DIAGNOSIS — Z91040 Latex allergy status: Secondary | ICD-10-CM

## 2021-02-07 DIAGNOSIS — Z951 Presence of aortocoronary bypass graft: Secondary | ICD-10-CM

## 2021-02-07 DIAGNOSIS — M3500 Sicca syndrome, unspecified: Secondary | ICD-10-CM | POA: Diagnosis present

## 2021-02-07 DIAGNOSIS — Z9841 Cataract extraction status, right eye: Secondary | ICD-10-CM

## 2021-02-07 DIAGNOSIS — I700 Atherosclerosis of aorta: Secondary | ICD-10-CM | POA: Diagnosis present

## 2021-02-07 DIAGNOSIS — Z87891 Personal history of nicotine dependence: Secondary | ICD-10-CM

## 2021-02-07 DIAGNOSIS — Z7982 Long term (current) use of aspirin: Secondary | ICD-10-CM

## 2021-02-07 DIAGNOSIS — Z9842 Cataract extraction status, left eye: Secondary | ICD-10-CM

## 2021-02-07 DIAGNOSIS — Z9981 Dependence on supplemental oxygen: Secondary | ICD-10-CM

## 2021-02-07 DIAGNOSIS — Z66 Do not resuscitate: Secondary | ICD-10-CM | POA: Diagnosis present

## 2021-02-07 DIAGNOSIS — Z953 Presence of xenogenic heart valve: Secondary | ICD-10-CM

## 2021-02-07 DIAGNOSIS — Z955 Presence of coronary angioplasty implant and graft: Secondary | ICD-10-CM

## 2021-02-07 DIAGNOSIS — J810 Acute pulmonary edema: Secondary | ICD-10-CM | POA: Diagnosis present

## 2021-02-07 DIAGNOSIS — Z794 Long term (current) use of insulin: Secondary | ICD-10-CM

## 2021-02-07 DIAGNOSIS — Z803 Family history of malignant neoplasm of breast: Secondary | ICD-10-CM

## 2021-02-07 DIAGNOSIS — E1151 Type 2 diabetes mellitus with diabetic peripheral angiopathy without gangrene: Secondary | ICD-10-CM | POA: Diagnosis present

## 2021-02-07 DIAGNOSIS — R195 Other fecal abnormalities: Secondary | ICD-10-CM | POA: Diagnosis present

## 2021-02-07 DIAGNOSIS — E871 Hypo-osmolality and hyponatremia: Secondary | ICD-10-CM | POA: Diagnosis present

## 2021-02-07 DIAGNOSIS — Z90710 Acquired absence of both cervix and uterus: Secondary | ICD-10-CM

## 2021-02-07 DIAGNOSIS — Z888 Allergy status to other drugs, medicaments and biological substances status: Secondary | ICD-10-CM

## 2021-02-07 DIAGNOSIS — G4700 Insomnia, unspecified: Secondary | ICD-10-CM | POA: Diagnosis present

## 2021-02-07 DIAGNOSIS — Z9049 Acquired absence of other specified parts of digestive tract: Secondary | ICD-10-CM

## 2021-02-07 DIAGNOSIS — I110 Hypertensive heart disease with heart failure: Secondary | ICD-10-CM | POA: Diagnosis present

## 2021-02-07 DIAGNOSIS — J441 Chronic obstructive pulmonary disease with (acute) exacerbation: Principal | ICD-10-CM | POA: Diagnosis present

## 2021-02-07 DIAGNOSIS — E875 Hyperkalemia: Secondary | ICD-10-CM | POA: Diagnosis present

## 2021-02-07 DIAGNOSIS — Z8719 Personal history of other diseases of the digestive system: Secondary | ICD-10-CM

## 2021-02-07 DIAGNOSIS — E7800 Pure hypercholesterolemia, unspecified: Secondary | ICD-10-CM | POA: Diagnosis present

## 2021-02-07 DIAGNOSIS — Z825 Family history of asthma and other chronic lower respiratory diseases: Secondary | ICD-10-CM

## 2021-02-07 DIAGNOSIS — Z91013 Allergy to seafood: Secondary | ICD-10-CM

## 2021-02-07 DIAGNOSIS — Z961 Presence of intraocular lens: Secondary | ICD-10-CM | POA: Diagnosis present

## 2021-02-07 DIAGNOSIS — Z885 Allergy status to narcotic agent status: Secondary | ICD-10-CM

## 2021-02-07 DIAGNOSIS — I5032 Chronic diastolic (congestive) heart failure: Secondary | ICD-10-CM | POA: Diagnosis present

## 2021-02-07 DIAGNOSIS — K219 Gastro-esophageal reflux disease without esophagitis: Secondary | ICD-10-CM | POA: Diagnosis present

## 2021-02-07 DIAGNOSIS — Z8249 Family history of ischemic heart disease and other diseases of the circulatory system: Secondary | ICD-10-CM

## 2021-02-07 DIAGNOSIS — I2510 Atherosclerotic heart disease of native coronary artery without angina pectoris: Secondary | ICD-10-CM | POA: Diagnosis present

## 2021-02-07 DIAGNOSIS — Z20822 Contact with and (suspected) exposure to covid-19: Secondary | ICD-10-CM | POA: Diagnosis present

## 2021-02-07 DIAGNOSIS — Z79899 Other long term (current) drug therapy: Secondary | ICD-10-CM

## 2021-02-07 DIAGNOSIS — F411 Generalized anxiety disorder: Secondary | ICD-10-CM | POA: Diagnosis present

## 2021-02-07 DIAGNOSIS — R0602 Shortness of breath: Secondary | ICD-10-CM | POA: Diagnosis not present

## 2021-02-07 DIAGNOSIS — H9190 Unspecified hearing loss, unspecified ear: Secondary | ICD-10-CM | POA: Diagnosis present

## 2021-02-07 DIAGNOSIS — J9611 Chronic respiratory failure with hypoxia: Secondary | ICD-10-CM | POA: Diagnosis present

## 2021-02-07 DIAGNOSIS — Z823 Family history of stroke: Secondary | ICD-10-CM

## 2021-02-07 DIAGNOSIS — I252 Old myocardial infarction: Secondary | ICD-10-CM

## 2021-02-07 DIAGNOSIS — E1165 Type 2 diabetes mellitus with hyperglycemia: Secondary | ICD-10-CM | POA: Diagnosis present

## 2021-02-07 DIAGNOSIS — Z9851 Tubal ligation status: Secondary | ICD-10-CM

## 2021-02-07 LAB — BASIC METABOLIC PANEL
Anion gap: 6 (ref 5–15)
BUN: 17 mg/dL (ref 8–23)
CO2: 25 mmol/L (ref 22–32)
Calcium: 8 mg/dL — ABNORMAL LOW (ref 8.9–10.3)
Chloride: 100 mmol/L (ref 98–111)
Creatinine, Ser: 1.04 mg/dL — ABNORMAL HIGH (ref 0.44–1.00)
GFR, Estimated: 56 mL/min — ABNORMAL LOW (ref >60)
Glucose, Bld: 233 mg/dL — ABNORMAL HIGH (ref 70–99)
Potassium: 5.3 mmol/L — ABNORMAL HIGH (ref 3.5–5.1)
Sodium: 131 mmol/L — ABNORMAL LOW (ref 135–145)

## 2021-02-07 LAB — CBC
HCT: 26.6 % — ABNORMAL LOW (ref 36.0–46.0)
Hemoglobin: 8.9 g/dL — ABNORMAL LOW (ref 12.0–15.0)
MCH: 29 pg (ref 26.0–34.0)
MCHC: 33.5 g/dL (ref 30.0–36.0)
MCV: 86.6 fL (ref 80.0–100.0)
Platelets: 167 10*3/uL (ref 150–400)
RBC: 3.07 MIL/uL — ABNORMAL LOW (ref 3.87–5.11)
RDW: 13.6 % (ref 11.5–15.5)
WBC: 10.4 10*3/uL (ref 4.0–10.5)
nRBC: 0 % (ref 0.0–0.2)

## 2021-02-07 LAB — TROPONIN I (HIGH SENSITIVITY): Troponin I (High Sensitivity): 100 ng/L (ref <18)

## 2021-02-07 LAB — BRAIN NATRIURETIC PEPTIDE: B Natriuretic Peptide: 423 pg/mL — ABNORMAL HIGH (ref 0.0–100.0)

## 2021-02-07 NOTE — ED Triage Notes (Addendum)
Pt to triage via w/c with c/o [facility] for "weeks" accomp by mid CP; denies any recent illness; st hx of same with dx CHF; normally on O2 at 3l/min via [place]; pt arrives without O2, sats 97% on ra; placed on home O2

## 2021-02-08 ENCOUNTER — Inpatient Hospital Stay: Payer: Medicare HMO

## 2021-02-08 ENCOUNTER — Inpatient Hospital Stay
Admission: EM | Admit: 2021-02-08 | Discharge: 2021-02-13 | DRG: 190 | Disposition: A | Discharge status: Home / Self Care | Payer: Medicare HMO | Attending: Internal Medicine | Admitting: Internal Medicine

## 2021-02-08 ENCOUNTER — Encounter: Payer: Self-pay | Admitting: Family Medicine

## 2021-02-08 DIAGNOSIS — J810 Acute pulmonary edema: Secondary | ICD-10-CM | POA: Diagnosis present

## 2021-02-08 DIAGNOSIS — J441 Chronic obstructive pulmonary disease with (acute) exacerbation: Secondary | ICD-10-CM | POA: Diagnosis present

## 2021-02-08 DIAGNOSIS — R0602 Shortness of breath: Secondary | ICD-10-CM | POA: Diagnosis present

## 2021-02-08 DIAGNOSIS — F411 Generalized anxiety disorder: Secondary | ICD-10-CM | POA: Diagnosis present

## 2021-02-08 DIAGNOSIS — D649 Anemia, unspecified: Secondary | ICD-10-CM

## 2021-02-08 DIAGNOSIS — F32A Depression, unspecified: Secondary | ICD-10-CM | POA: Diagnosis present

## 2021-02-08 DIAGNOSIS — D638 Anemia in other chronic diseases classified elsewhere: Secondary | ICD-10-CM | POA: Diagnosis present

## 2021-02-08 DIAGNOSIS — Z515 Encounter for palliative care: Secondary | ICD-10-CM | POA: Diagnosis not present

## 2021-02-08 DIAGNOSIS — I350 Nonrheumatic aortic (valve) stenosis: Secondary | ICD-10-CM | POA: Diagnosis present

## 2021-02-08 DIAGNOSIS — I110 Hypertensive heart disease with heart failure: Secondary | ICD-10-CM | POA: Diagnosis present

## 2021-02-08 DIAGNOSIS — E1165 Type 2 diabetes mellitus with hyperglycemia: Secondary | ICD-10-CM | POA: Diagnosis present

## 2021-02-08 DIAGNOSIS — E890 Postprocedural hypothyroidism: Secondary | ICD-10-CM | POA: Diagnosis present

## 2021-02-08 DIAGNOSIS — Z961 Presence of intraocular lens: Secondary | ICD-10-CM | POA: Diagnosis present

## 2021-02-08 DIAGNOSIS — K922 Gastrointestinal hemorrhage, unspecified: Secondary | ICD-10-CM

## 2021-02-08 DIAGNOSIS — E1151 Type 2 diabetes mellitus with diabetic peripheral angiopathy without gangrene: Secondary | ICD-10-CM | POA: Diagnosis present

## 2021-02-08 DIAGNOSIS — H9190 Unspecified hearing loss, unspecified ear: Secondary | ICD-10-CM | POA: Diagnosis present

## 2021-02-08 DIAGNOSIS — E871 Hypo-osmolality and hyponatremia: Secondary | ICD-10-CM | POA: Diagnosis present

## 2021-02-08 DIAGNOSIS — I5032 Chronic diastolic (congestive) heart failure: Secondary | ICD-10-CM | POA: Diagnosis present

## 2021-02-08 DIAGNOSIS — Z20822 Contact with and (suspected) exposure to covid-19: Secondary | ICD-10-CM | POA: Diagnosis present

## 2021-02-08 DIAGNOSIS — I214 Non-ST elevation (NSTEMI) myocardial infarction: Secondary | ICD-10-CM | POA: Diagnosis not present

## 2021-02-08 DIAGNOSIS — R195 Other fecal abnormalities: Secondary | ICD-10-CM | POA: Diagnosis present

## 2021-02-08 DIAGNOSIS — Z7189 Other specified counseling: Secondary | ICD-10-CM | POA: Diagnosis not present

## 2021-02-08 DIAGNOSIS — E875 Hyperkalemia: Secondary | ICD-10-CM | POA: Diagnosis present

## 2021-02-08 DIAGNOSIS — E039 Hypothyroidism, unspecified: Secondary | ICD-10-CM | POA: Diagnosis not present

## 2021-02-08 DIAGNOSIS — R918 Other nonspecific abnormal finding of lung field: Secondary | ICD-10-CM

## 2021-02-08 DIAGNOSIS — G4700 Insomnia, unspecified: Secondary | ICD-10-CM | POA: Diagnosis present

## 2021-02-08 DIAGNOSIS — R079 Chest pain, unspecified: Secondary | ICD-10-CM | POA: Diagnosis not present

## 2021-02-08 DIAGNOSIS — E7800 Pure hypercholesterolemia, unspecified: Secondary | ICD-10-CM | POA: Diagnosis present

## 2021-02-08 DIAGNOSIS — J9611 Chronic respiratory failure with hypoxia: Secondary | ICD-10-CM | POA: Diagnosis present

## 2021-02-08 DIAGNOSIS — I2510 Atherosclerotic heart disease of native coronary artery without angina pectoris: Secondary | ICD-10-CM | POA: Diagnosis present

## 2021-02-08 DIAGNOSIS — E119 Type 2 diabetes mellitus without complications: Secondary | ICD-10-CM

## 2021-02-08 DIAGNOSIS — M3500 Sicca syndrome, unspecified: Secondary | ICD-10-CM | POA: Diagnosis present

## 2021-02-08 DIAGNOSIS — I700 Atherosclerosis of aorta: Secondary | ICD-10-CM | POA: Diagnosis present

## 2021-02-08 DIAGNOSIS — R778 Other specified abnormalities of plasma proteins: Secondary | ICD-10-CM | POA: Diagnosis not present

## 2021-02-08 DIAGNOSIS — Z66 Do not resuscitate: Secondary | ICD-10-CM | POA: Diagnosis present

## 2021-02-08 LAB — BASIC METABOLIC PANEL WITH GFR
Anion gap: 10 (ref 5–15)
BUN: 20 mg/dL (ref 8–23)
CO2: 25 mmol/L (ref 22–32)
Calcium: 7.9 mg/dL — ABNORMAL LOW (ref 8.9–10.3)
Chloride: 96 mmol/L — ABNORMAL LOW (ref 98–111)
Creatinine, Ser: 1.22 mg/dL — ABNORMAL HIGH (ref 0.44–1.00)
GFR, Estimated: 46 mL/min — ABNORMAL LOW
Glucose, Bld: 429 mg/dL — ABNORMAL HIGH (ref 70–99)
Potassium: 4.7 mmol/L (ref 3.5–5.1)
Sodium: 131 mmol/L — ABNORMAL LOW (ref 135–145)

## 2021-02-08 LAB — TROPONIN I (HIGH SENSITIVITY)
Troponin I (High Sensitivity): 113 ng/L (ref <18)
Troponin I (High Sensitivity): 62 ng/L — ABNORMAL HIGH (ref <18)

## 2021-02-08 LAB — CBG MONITORING, ED
Glucose-Capillary: 400 mg/dL — ABNORMAL HIGH (ref 70–99)
Glucose-Capillary: 416 mg/dL — ABNORMAL HIGH (ref 70–99)

## 2021-02-08 LAB — HEMOGLOBIN A1C
Hgb A1c MFr Bld: 8.1 % — ABNORMAL HIGH (ref 4.8–5.6)
Mean Plasma Glucose: 185.77 mg/dL

## 2021-02-08 LAB — GLUCOSE, CAPILLARY
Glucose-Capillary: 339 mg/dL — ABNORMAL HIGH (ref 70–99)
Glucose-Capillary: 391 mg/dL — ABNORMAL HIGH (ref 70–99)
Glucose-Capillary: 551 mg/dL (ref 70–99)
Glucose-Capillary: 504 mg/dL (ref 70–99)

## 2021-02-08 LAB — CBC
HCT: 27.4 % — ABNORMAL LOW (ref 36.0–46.0)
Hemoglobin: 8.9 g/dL — ABNORMAL LOW (ref 12.0–15.0)
MCH: 28 pg (ref 26.0–34.0)
MCHC: 32.5 g/dL (ref 30.0–36.0)
MCV: 86.2 fL (ref 80.0–100.0)
Platelets: 171 K/uL (ref 150–400)
RBC: 3.18 MIL/uL — ABNORMAL LOW (ref 3.87–5.11)
RDW: 13.4 % (ref 11.5–15.5)
WBC: 9.4 K/uL (ref 4.0–10.5)
nRBC: 0 % (ref 0.0–0.2)

## 2021-02-08 LAB — RESP PANEL BY RT-PCR (FLU A&B, COVID) ARPGX2
Influenza A by PCR: NEGATIVE
Influenza B by PCR: NEGATIVE
SARS Coronavirus 2 by RT PCR: NEGATIVE

## 2021-02-08 MED ORDER — POTASSIUM CHLORIDE CRYS ER 10 MEQ PO TBCR
10.0000 meq | EXTENDED_RELEASE_TABLET | Freq: Every day | ORAL | Status: DC
  Administered 2021-02-08 – 2021-02-10 (×3): 10 meq via ORAL
  Filled 2021-02-08 (×3): qty 1

## 2021-02-08 MED ORDER — IPRATROPIUM-ALBUTEROL 0.5-2.5 (3) MG/3ML IN SOLN: 3.0000 mL | Freq: Four times a day (QID) | RESPIRATORY_TRACT | Status: DC

## 2021-02-08 MED ORDER — ONDANSETRON HCL 4 MG PO TABS: 4.0000 mg | ORAL_TABLET | Freq: Four times a day (QID) | ORAL | Status: DC | PRN

## 2021-02-08 MED ORDER — ACETAMINOPHEN 325 MG PO TABS
650.0000 mg | ORAL_TABLET | Freq: Four times a day (QID) | ORAL | Status: DC | PRN
  Administered 2021-02-08 – 2021-02-12 (×2): 650 mg via ORAL
  Filled 2021-02-08 (×2): qty 2

## 2021-02-08 MED ORDER — PANTOPRAZOLE 80MG IVPB - SIMPLE MED
80.0000 mg | Freq: Once | INTRAVENOUS | Status: AC
  Administered 2021-02-08: 80 mg via INTRAVENOUS
  Filled 2021-02-08: qty 100

## 2021-02-08 MED ORDER — ISOSORBIDE MONONITRATE ER 30 MG PO TB24
30.0000 mg | ORAL_TABLET | Freq: Every day | ORAL | Status: DC
  Administered 2021-02-08: 30 mg via ORAL
  Filled 2021-02-08: qty 1

## 2021-02-08 MED ORDER — PANTOPRAZOLE SODIUM 40 MG IV SOLR: 40.0000 mg | Freq: Two times a day (BID) | INTRAVENOUS | Status: DC

## 2021-02-08 MED ORDER — IOHEXOL 350 MG/ML SOLN
75.0000 mL | Freq: Once | INTRAVENOUS | Status: AC | PRN
  Administered 2021-02-08: 75 mL via INTRAVENOUS

## 2021-02-08 MED ORDER — PANTOPRAZOLE 80MG IVPB - SIMPLE MED: 80.0000 mg | Freq: Once | INTRAVENOUS | Status: DC

## 2021-02-08 MED ORDER — PANTOPRAZOLE SODIUM 40 MG PO TBEC: 40.0000 mg | DELAYED_RELEASE_TABLET | Freq: Every morning | ORAL | Status: DC

## 2021-02-08 MED ORDER — LOSARTAN POTASSIUM 25 MG PO TABS
25.0000 mg | ORAL_TABLET | Freq: Every morning | ORAL | Status: DC
  Administered 2021-02-08: 25 mg via ORAL
  Filled 2021-02-08: qty 1

## 2021-02-08 MED ORDER — ALBUTEROL SULFATE (2.5 MG/3ML) 0.083% IN NEBU
5.0000 mg | INHALATION_SOLUTION | Freq: Once | RESPIRATORY_TRACT | Status: AC
  Administered 2021-02-08: 5 mg via RESPIRATORY_TRACT
  Filled 2021-02-08: qty 6

## 2021-02-08 MED ORDER — ALPRAZOLAM 0.5 MG PO TABS
0.5000 mg | ORAL_TABLET | Freq: Three times a day (TID) | ORAL | Status: DC
  Administered 2021-02-08 – 2021-02-09 (×3): 0.5 mg via ORAL
  Filled 2021-02-08 (×3): qty 1

## 2021-02-08 MED ORDER — INSULIN GLARGINE-YFGN 100 UNIT/ML ~~LOC~~ SOLN: 100.0000 [IU] | Freq: Every day | SUBCUTANEOUS | Status: DC

## 2021-02-08 MED ORDER — INSULIN ASPART PROT & ASPART (70-30 MIX) 100 UNIT/ML ~~LOC~~ SUSP
60.0000 [IU] | Freq: Two times a day (BID) | SUBCUTANEOUS | Status: DC
  Administered 2021-02-09 – 2021-02-10 (×3): 60 [IU] via SUBCUTANEOUS
  Filled 2021-02-08 (×2): qty 10

## 2021-02-08 MED ORDER — LEVOTHYROXINE SODIUM 25 MCG PO TABS
125.0000 ug | ORAL_TABLET | Freq: Every day | ORAL | Status: DC
  Administered 2021-02-08 – 2021-02-13 (×6): 125 ug via ORAL
  Filled 2021-02-08 (×3): qty 1
  Filled 2021-02-08: qty 3
  Filled 2021-02-08 (×2): qty 1

## 2021-02-08 MED ORDER — PREDNISONE 20 MG PO TABS: 40.0000 mg | ORAL_TABLET | Freq: Every day | ORAL | Status: DC

## 2021-02-08 MED ORDER — FUROSEMIDE 10 MG/ML IJ SOLN
40.0000 mg | Freq: Once | INTRAMUSCULAR | Status: AC
  Administered 2021-02-08: 40 mg via INTRAVENOUS
  Filled 2021-02-08: qty 4

## 2021-02-08 MED ORDER — CLOPIDOGREL BISULFATE 75 MG PO TABS: 75.0000 mg | ORAL_TABLET | Freq: Every day | ORAL | Status: DC

## 2021-02-08 MED ORDER — INSULIN ASPART 100 UNIT/ML IJ SOLN
0.0000 [IU] | Freq: Three times a day (TID) | INTRAMUSCULAR | Status: DC
  Administered 2021-02-08: 11 [IU] via SUBCUTANEOUS
  Administered 2021-02-08 (×2): 15 [IU] via SUBCUTANEOUS
  Filled 2021-02-08 (×3): qty 1

## 2021-02-08 MED ORDER — IPRATROPIUM-ALBUTEROL 0.5-2.5 (3) MG/3ML IN SOLN
3.0000 mL | Freq: Four times a day (QID) | RESPIRATORY_TRACT | Status: DC
  Administered 2021-02-08 – 2021-02-09 (×5): 3 mL via RESPIRATORY_TRACT
  Filled 2021-02-08 (×5): qty 3

## 2021-02-08 MED ORDER — ACETAMINOPHEN 650 MG RE SUPP: 650.0000 mg | Freq: Four times a day (QID) | RECTAL | Status: DC | PRN

## 2021-02-08 MED ORDER — FUROSEMIDE 10 MG/ML IJ SOLN
40.0000 mg | Freq: Two times a day (BID) | INTRAMUSCULAR | Status: DC
  Administered 2021-02-08 – 2021-02-09 (×2): 40 mg via INTRAVENOUS
  Filled 2021-02-08 (×2): qty 4

## 2021-02-08 MED ORDER — INSULIN ASPART 100 UNIT/ML IJ SOLN
25.0000 [IU] | Freq: Once | INTRAMUSCULAR | Status: AC
  Administered 2021-02-08: 25 [IU] via SUBCUTANEOUS
  Filled 2021-02-08: qty 1

## 2021-02-08 MED ORDER — IOHEXOL 350 MG/ML SOLN: 75.0000 mL | Freq: Once | INTRAVENOUS | Status: DC | PRN

## 2021-02-08 MED ORDER — HYDROXYZINE HCL 25 MG PO TABS
25.0000 mg | ORAL_TABLET | Freq: Three times a day (TID) | ORAL | Status: DC
  Administered 2021-02-08 (×2): 25 mg via ORAL
  Filled 2021-02-08 (×2): qty 1

## 2021-02-08 MED ORDER — ONDANSETRON HCL 4 MG/2ML IJ SOLN
4.0000 mg | Freq: Four times a day (QID) | INTRAMUSCULAR | Status: DC | PRN
  Administered 2021-02-09 – 2021-02-12 (×3): 4 mg via INTRAVENOUS
  Filled 2021-02-08 (×3): qty 2

## 2021-02-08 MED ORDER — PANTOPRAZOLE INFUSION (NEW) - SIMPLE MED
8.0000 mg/h | INTRAVENOUS | Status: DC
  Administered 2021-02-08: 8 mg/h via INTRAVENOUS
  Filled 2021-02-08: qty 100

## 2021-02-08 MED ORDER — PROMETHAZINE HCL 25 MG PO TABS: 25.0000 mg | ORAL_TABLET | Freq: Four times a day (QID) | ORAL | Status: DC | PRN

## 2021-02-08 MED ORDER — TRAZODONE HCL 100 MG PO TABS: 200.0000 mg | ORAL_TABLET | Freq: Every day | ORAL | Status: DC

## 2021-02-08 MED ORDER — FLUOXETINE HCL 20 MG PO CAPS
40.0000 mg | ORAL_CAPSULE | Freq: Two times a day (BID) | ORAL | Status: DC
  Administered 2021-02-08 – 2021-02-13 (×11): 40 mg via ORAL
  Filled 2021-02-08 (×12): qty 2

## 2021-02-08 MED ORDER — ALPRAZOLAM 0.25 MG PO TABS
0.2500 mg | ORAL_TABLET | Freq: Once | ORAL | Status: AC
  Administered 2021-02-08: 0.25 mg via ORAL
  Filled 2021-02-08: qty 1

## 2021-02-08 MED ORDER — METHYLPREDNISOLONE SODIUM SUCC 40 MG IJ SOLR
20.0000 mg | Freq: Two times a day (BID) | INTRAMUSCULAR | Status: DC
  Administered 2021-02-08 – 2021-02-09 (×2): 20 mg via INTRAVENOUS
  Filled 2021-02-08 (×2): qty 1

## 2021-02-08 MED ORDER — TORSEMIDE 20 MG PO TABS
20.0000 mg | ORAL_TABLET | Freq: Every day | ORAL | Status: DC
  Administered 2021-02-08: 20 mg via ORAL
  Filled 2021-02-08: qty 1

## 2021-02-08 MED ORDER — DULOXETINE HCL 20 MG PO CPEP
20.0000 mg | ORAL_CAPSULE | Freq: Every morning | ORAL | Status: DC
  Administered 2021-02-08 – 2021-02-13 (×6): 20 mg via ORAL
  Filled 2021-02-08 (×6): qty 1

## 2021-02-08 MED ORDER — BUSPIRONE HCL 10 MG PO TABS
10.0000 mg | ORAL_TABLET | Freq: Three times a day (TID) | ORAL | Status: DC | PRN
  Administered 2021-02-08: 10 mg via ORAL
  Filled 2021-02-08 (×2): qty 1

## 2021-02-08 MED ORDER — PANTOPRAZOLE SODIUM 40 MG PO TBEC
40.0000 mg | DELAYED_RELEASE_TABLET | Freq: Every day | ORAL | Status: DC
  Administered 2021-02-08 – 2021-02-13 (×6): 40 mg via ORAL
  Filled 2021-02-08 (×6): qty 1

## 2021-02-08 MED ORDER — ASPIRIN EC 81 MG PO TBEC: 81.0000 mg | DELAYED_RELEASE_TABLET | Freq: Every day | ORAL | Status: DC

## 2021-02-08 MED ORDER — SODIUM CHLORIDE 0.9 % IV SOLN: INTRAVENOUS | Status: DC

## 2021-02-08 MED ORDER — MAGNESIUM HYDROXIDE 400 MG/5ML PO SUSP: 30.0000 mL | Freq: Every day | ORAL | Status: DC | PRN

## 2021-02-08 MED ORDER — INSULIN ASPART 100 UNIT/ML IJ SOLN
0.0000 [IU] | Freq: Three times a day (TID) | INTRAMUSCULAR | Status: DC
Start: 2021-02-09 — End: 2021-02-10
  Administered 2021-02-09 (×2): 15 [IU] via SUBCUTANEOUS
  Administered 2021-02-10: 20 [IU] via SUBCUTANEOUS
  Filled 2021-02-08 (×3): qty 1

## 2021-02-08 MED ORDER — METHYLPREDNISOLONE SODIUM SUCC 40 MG IJ SOLR
40.0000 mg | Freq: Two times a day (BID) | INTRAMUSCULAR | Status: DC
  Administered 2021-02-08: 40 mg via INTRAVENOUS
  Filled 2021-02-08: qty 1

## 2021-02-08 MED ORDER — METHYLPREDNISOLONE SODIUM SUCC 125 MG IJ SOLR
125.0000 mg | Freq: Once | INTRAMUSCULAR | Status: AC
  Administered 2021-02-08: 125 mg via INTRAVENOUS
  Filled 2021-02-08: qty 2

## 2021-02-08 NOTE — Consult Note (Signed)
Pulmonary Medicine          Date: 02/08/2021,   MRN# [id number] [name] 1945/10/16     AdmissionWeight: 90.7 kg                 CurrentWeight: 90.7 kg   Referring physician: Dr Remi Haggard   CHIEF COMPLAINT:   Respiratory distress with Acute exacerbation of COPD   HISTORY OF PRESENT ILLNESS   75 yo with hx of CABG 2015 , depression,anxiety, hypertension, OSA, COPD, iron deficiency anemia, aortic dilatation, presents emergency department for cc of dyspnea and sob. She is chronically hypoxemic with 3L/min [place] requirement. At bedside, she is no longer tripoding and resting in ED bed and does not appear to be in acute distress. She fully participated with physical exam. Patient presented to the ER with acute onset of worsening dyspnea today with associated chest tightness that is diffuse without radiation and associated nonproductive cough with dyspnea he was recently admitted here and had a negative VQ scan.  She denied any leg pain or worsening edema or recent travels or surgeries.  No melena or bright red bleeding per rectum.  No dysuria, oliguria or hematuria or flank pain.She is able to tell me her full name, DOB, location of hospital, and the current calendar year.She denies recent fever, nausea, vomiting, abdominal pain, dysuria, diarrhea, hematuria, blood in her stool, syncope, lost of consciousness. She reports chest pain every time she coughs. She reports no new cough. She is unsure if she has had weight gain or lost. She denies flu like illness. She denies LE edema. PCCM consult placed for further evaluation and management. Patient has Sjogrens disease her CT chest shows inflammatory changes and pulmonary edema. She is asking to go home but I explained this premature and she agrees to stay.    PAST MEDICAL HISTORY   Past Medical History:  Diagnosis Date  . 1st degree AV block   . ACE-inhibitor cough   . Allergic rhinitis   . Anemia    iron deficiency anemia  .  Anxiety   . Aortic ectasia (HCC)    a. CT abd in 12/2016 incidentally noted aortic atherosclerosis and infrarenal abdominal aortic ectasia measuring as large as 2.7 cm with recommendation to repeat US in 2023.  Marland Kitchen Arthritis   . Asthma   . Cataract   . Chronic depression   . Chronic diastolic CHF (congestive heart failure) ([place])   . Chronic headache   . COPD (chronic obstructive pulmonary disease) ([place])   . Coronary artery disease    a. DES to RCA and mid Cx 2009. b. CABG and bioprosthetic AVR May 2015. c. cutting balloon to prox Cx in 05/2016  . Diabetes mellitus    type 2  . Diverticulitis of colon   . Essential hypertension   . GERD (gastroesophageal reflux disease)   . Hearing loss   . History of blood transfusion 2013  . History of prosthetic aortic valve replacement   . HOH (hard of hearing)   . Hypercholesterolemia    intolerance of statins and niaspan  . IDA (iron deficiency anemia) 02/03/2019  . Mobitz type 1 second degree AV block   . OSA (obstructive sleep apnea)    mild, intolerant of cpap  . PAD (peripheral artery disease) ([place])    a. atherosclerosis by CT abd 12/2016 in [place].  Marland Kitchen PONV (postoperative nausea and vomiting)   . Statin intolerance   . Thyroid disease  SURGICAL HISTORY   Past Surgical History:  Procedure Laterality Date  . ABDOMINAL HYSTERECTOMY    . ABDOMINAL HYSTERECTOMY W/ PARTIAL VAGINACTOMY    . AORTIC VALVE REPLACEMENT N/A 10/12/2013   Procedure: AORTIC VALVE REPLACEMENT (AVR);  Surgeon: [name], MD;  Location: [place];  Service: Open Heart Surgery;  Laterality: N/A;  . [place]  . BARTHOLIN GLAND CYST EXCISION    . BLADDER SUSPENSION    . BREAST BIOPSY Bilateral 09/11/2000   neg  . BREAST BIOPSY Left 07/24/2010   neg  . BREAST CYST EXCISION  1988   bilateral nonmalignant tumors, x3  . CARDIAC CATHETERIZATION    . CARDIAC CATHETERIZATION N/A 05/25/2016   Procedure: Coronary Balloon Angioplasty;  Surgeon: Leonie Man, MD;   Location: [facility] [facility];  Service: Cardiovascular;  Laterality: N/A;  . CARDIAC CATHETERIZATION N/A 05/25/2016   Procedure: Coronary/Graft Angiography;  Surgeon: Leonie Man, MD;  Location: [place] [facility];  Service: Cardiovascular;  Laterality: N/A;  . CATARACT EXTRACTION W/ INTRAOCULAR LENS  IMPLANT, BILATERAL    . CHOLECYSTECTOMY  2001  . COLECTOMY     lap sigmoid  . COLONOSCOPY  2014   polyps found, 2 clamped off.  . CORONARY ANGIOPLASTY  10/29/2007   Prox RCA & Mid Cx.  . CORONARY ARTERY BYPASS GRAFT N/A 10/12/2013   Procedure: CORONARY ARTERY BYPASS GRAFT TIMES TWO;  Surgeon: [name], MD;  Location: [place];  Service: Open Heart Surgery;  Laterality: N/A;  . CORONARY/GRAFT ANGIOGRAPHY N/A 09/20/2017   Procedure: CORONARY/GRAFT ANGIOGRAPHY;  Surgeon: Sherren Mocha, MD;  Location: [place] [facility];  Service: Cardiovascular;  Laterality: N/A;  . LEFT HEART CATHETERIZATION WITH CORONARY ANGIOGRAM N/A 10/09/2013   Procedure: LEFT HEART CATHETERIZATION WITH CORONARY ANGIOGRAM;  Surgeon: [name], MD;  Location: [facility];  Service: Cardiovascular;  Laterality: N/A;  . RIGHT/LEFT HEART CATH AND CORONARY ANGIOGRAPHY N/A 11/03/2020   Procedure: RIGHT/LEFT HEART CATH AND CORONARY ANGIOGRAPHY;  Surgeon: Corey Skains, MD;  Location: [place] [facility];  Service: Cardiovascular;  Laterality: N/A;  . STERNAL WIRES REMOVAL N/A 04/13/2014   Procedure: STERNAL WIRES REMOVAL;  Surgeon: [name], MD;  Location: [place];  Service: Thoracic;  Laterality: N/A;  . TEE WITHOUT CARDIOVERSION N/A 11/03/2020   Procedure: TRANSESOPHAGEAL ECHOCARDIOGRAM (TEE);  Surgeon: Corey Skains, MD;  Location: [facility];  Service: Cardiovascular;  Laterality: N/A;  . THYROIDECTOMY    . TONSILLECTOMY    . TUBAL LIGATION    . VAGINAL DELIVERY     3  . VISCERAL ARTERY INTERVENTION N/A 08/16/2016   Procedure: Visceral Artery Intervention;  Surgeon: Algernon Huxley, MD;  Location: [place] [facility];  Service: Cardiovascular;  Laterality: N/A;     FAMILY HISTORY   Family History  Problem Relation Age of Onset  . Breast cancer Mother 80  . Hypertension Father   . Mesothelioma Father   . Asthma Father   . Stroke Paternal Grandfather   . Heart disease Other   . Breast cancer Maternal Aunt   . Breast cancer Paternal Aunt      SOCIAL HISTORY   Social History   Tobacco Use  . Smoking status: Former    Packs/day: 0.50    Years: 30.00    Pack years: 15.00    Types: Cigarettes    Quit date: 10/02/2013    Years since quitting: 7.3  . Smokeless tobacco: Never  Vaping Use  .  Vaping Use: Never used  Substance Use Topics  . Alcohol use: No  . Drug use: No     MEDICATIONS    Home Medication:    Current Medication:  Current Facility-Administered Medications:  .  0.9 %  sodium chloride infusion, , Intravenous, Continuous, Mansy, Jan A, MD, Last Rate: 100 mL/hr at 02/08/21 0323, New Bag at 02/08/21 0323 .  acetaminophen (TYLENOL) tablet 650 mg, 650 mg, Oral, Q6H PRN **OR** acetaminophen (TYLENOL) suppository 650 mg, 650 mg, Rectal, Q6H PRN, Mansy, Jan A, MD .  busPIRone (BUSPAR) tablet 10 mg, 10 mg, Oral, TID PRN, Mansy, Jan A, MD .  DULoxetine (CYMBALTA) DR capsule 20 mg, 20 mg, Oral, q AM, Mansy, Jan A, MD .  FLUoxetine (PROZAC) capsule 40 mg, 40 mg, Oral, BID, Mansy, Jan A, MD .  hydrOXYzine (ATARAX/VISTARIL) tablet 25 mg, 25 mg, Oral, TID, Mansy, Jan A, MD .  insulin aspart (novoLOG) injection 0-15 Units, 0-15 Units, Subcutaneous, TID [facility], Mansy, Arvella Merles, MD, 15 Units at 02/08/21 0829 .  insulin aspart protamine- aspart (NOVOLOG MIX 70/30) injection 60 Units, 60 Units, Subcutaneous, BID WC, Mansy, Jan A, MD .  ipratropium-albuterol (DUONEB) 0.5-2.5 (3) MG/3ML nebulizer solution 3 mL, 3 mL, Nebulization, QID, Mansy, Jan A, MD, 3 mL at 02/08/21 0830 .  isosorbide mononitrate (IMDUR) 24 hr tablet 30 mg, 30 mg, Oral, Daily, Mansy, Jan A, MD .   levothyroxine (SYNTHROID) tablet 125 mcg, 125 mcg, Oral, Q0600, Mansy, Jan A, MD, 125 mcg at 02/08/21 0549 .  losartan (COZAAR) tablet 25 mg, 25 mg, Oral, q AM, Mansy, Jan A, MD .  magnesium hydroxide (MILK OF MAGNESIA) suspension 30 mL, 30 mL, Oral, Daily PRN, Mansy, Jan A, MD .  methylPREDNISolone sodium succinate (SOLU-MEDROL) 40 mg/mL injection 40 mg, 40 mg, Intravenous, Q12H **FOLLOWED BY** [START ON 02/09/2021] predniSONE (DELTASONE) tablet 40 mg, 40 mg, Oral, Q breakfast, Mansy, Jan A, MD .  ondansetron (ZOFRAN) tablet 4 mg, 4 mg, Oral, Q6H PRN **OR** ondansetron (ZOFRAN) injection 4 mg, 4 mg, Intravenous, Q6H PRN, Mansy, Jan A, MD .  [place] ON 02/11/2021] pantoprazole (PROTONIX) injection 40 mg, 40 mg, Intravenous, Q12H, Sharion Settler, NP .  pantoprozole (PROTONIX) 80 mg /NS 100 mL infusion, 8 mg/hr, Intravenous, Continuous, Sharion Settler, NP, Last Rate: 10 mL/hr at 02/08/21 0545, 8 mg/hr at 02/08/21 0545 .  potassium chloride SA (KLOR-CON) CR tablet 10 mEq, 10 mEq, Oral, Daily, Mansy, Jan A, MD .  torsemide [facility]) tablet 20 mg, 20 mg, Oral, Daily, Mansy, Jan A, MD .  traZODone (DESYREL) tablet 200 mg, 200 mg, Oral, QHS, Mansy, Arvella Merles, MD  Current Outpatient Medications:  .  ANORO ELLIPTA 62.5-25 MCG/INH AEPB, Inhale 1 puff into the lungs daily., Disp: , Rfl:  .  aspirin EC 81 MG EC tablet, Take 1 tablet (81 mg total) by mouth daily. Swallow whole., Disp: 30 tablet, Rfl: 11 .  Calcium-Vitamin D (CALTRATE 600 PLUS-VIT D PO), Take 1 tablet by mouth 2 (two) times daily., Disp: , Rfl:  .  clopidogrel (PLAVIX) 75 MG tablet, Take 75 mg by mouth daily., Disp: , Rfl:  .  DULoxetine (CYMBALTA) 20 MG capsule, Take 1 capsule (20 mg total) by mouth in the morning., Disp: , Rfl:  .  FLUoxetine (PROZAC) 40 MG capsule, Take 40 mg by mouth 2 (two) times daily., Disp: , Rfl:  .  hydrOXYzine (ATARAX/VISTARIL) 25 MG tablet, Take 1 tablet (25 mg total) by mouth 3 (three) times daily.,  Disp: 90 tablet,  Rfl: 0 .  insulin lispro (HUMALOG) 100 UNIT/ML injection, Inject 0-54 Units into the skin 4 (four) times daily -  before meals and at bedtime., Disp: , Rfl:  .  insulin NPH-regular Human (NOVOLIN 70/30) (70-30) 100 UNIT/ML injection, Inject 60 Units into the skin 2 (two) times daily with a meal. Inject 60u under the skin every morning at breakfast and inject 60u under the skin every evening with supper, Disp: , Rfl:  .  isosorbide mononitrate (IMDUR) 30 MG 24 hr tablet, Take 1 tablet (30 mg total) by mouth daily., Disp: 30 tablet, Rfl: 11 .  levothyroxine (SYNTHROID, LEVOTHROID) 125 MCG tablet, Take 125 mcg by mouth daily before breakfast., Disp: , Rfl:  .  losartan (COZAAR) 25 MG tablet, Take 1 tablet (25 mg total) by mouth in the morning., Disp: , Rfl:  .  pantoprazole (PROTONIX) 40 MG tablet, Take 40 mg by mouth in the morning., Disp: , Rfl:  .  potassium chloride (KLOR-CON) 10 MEQ tablet, Take 1 tablet (10 mEq total) by mouth daily., Disp: 30 tablet, Rfl: 0 .  torsemide (DEMADEX) 20 MG tablet, Take 20 mg by mouth daily., Disp: , Rfl:  .  traZODone (DESYREL) 100 MG tablet, Take 200 mg by mouth at bedtime., Disp: , Rfl:  .  acetaminophen (TYLENOL) 500 MG tablet, Take 500-1,000 mg by mouth every 6 (six) hours as needed (pain)., Disp: , Rfl:  .  albuterol (PROVENTIL) (2.5 MG/3ML) 0.083% nebulizer solution, Take 2.5 mg by nebulization every 4 (four) hours as needed for shortness of breath or wheezing., Disp: , Rfl:  .  insulin glargine (LANTUS) 100 UNIT/ML injection, Inject 100 Units into the skin at bedtime. (Patient not taking: Reported on 02/08/2021), Disp: , Rfl:  .  nystatin (MYCOSTATIN) 100000 UNIT/ML suspension, Take by mouth., Disp: , Rfl:  .  promethazine (PHENERGAN) 25 MG tablet, Take 25 mg by mouth every 6 (six) hours as needed for nausea or vomiting., Disp: , Rfl:     ALLERGIES   Amitriptyline, Benadryl [diphenhydramine], Demerol [meperidine], Gabapentin, Meperidine hcl, Mirtazapine,  Olanzapine, Voltaren [diclofenac sodium], Zetia [ezetimibe], Ativan [lorazepam], Atorvastatin, Budesonide-formoterol fumarate, Bupropion hcl, Caffeine, Codeine sulfate, Lisinopril, Metformin, Mometasone furoate, Morphine sulfate, Other, Oxycodone-acetaminophen, Pioglitazone, Propoxyphene n-acetaminophen, Rosuvastatin, Shellfish allergy, Suvorexant, Ticagrelor, Tramadol, Trazodone and nefazodone, Venlafaxine, Zolpidem tartrate, and Latex     REVIEW OF SYSTEMS    Review of Systems:  Gen:  Denies  fever, sweats, chills weigh loss  HEENT: Denies blurred vision, double vision, ear pain, eye pain, hearing loss, nose bleeds, sore throat Cardiac:  No dizziness, chest pain or heaviness, chest tightness,edema Resp:   Denies cough or sputum porduction, shortness of breath,wheezing, hemoptysis,  Gi: Denies swallowing difficulty, stomach pain, nausea or vomiting, diarrhea, constipation, bowel incontinence Gu:  Denies bladder incontinence, burning urine Ext:   Denies Joint pain, stiffness or swelling Skin: Denies  skin rash, easy bruising or bleeding or hives Endoc:  Denies polyuria, polydipsia , polyphagia or weight change Psych:   Denies depression, insomnia or hallucinations   Other:  All other systems negative   VS: BP 140/68   Pulse 73   Temp 98.4 F (36.9 C) (Oral)   Resp 20   Ht '5\' 6"'$  (1.676 m)   Wt 90.7 kg   SpO2 100%   BMI 32.28 kg/m      PHYSICAL EXAM    GENERAL:NAD, no fevers, chills, no weakness no fatigue HEAD: Normocephalic, atraumatic.  EYES: Pupils equal, round, reactive to light.  Extraocular muscles intact. No scleral icterus.  MOUTH: Moist mucosal membrane. Dentition intact. No abscess noted.  EAR, NOSE, THROAT: Clear without exudates. No external lesions.  NECK: Supple. No thyromegaly. No nodules. No JVD.  PULMONARY: Diffuse coarse rhonchi right sided +wheezes CARDIOVASCULAR: S1 and S2. Regular rate and rhythm. No murmurs, rubs, or gallops. No edema. Pedal pulses  2+ bilaterally.  GASTROINTESTINAL: Soft, nontender, nondistended. No masses. Positive bowel sounds. No hepatosplenomegaly.  MUSCULOSKELETAL: No swelling, clubbing, or edema. Range of motion full in all extremities.  NEUROLOGIC: Cranial nerves II through XII are intact. No gross focal neurological deficits. Sensation intact. Reflexes intact.  SKIN: No ulceration, lesions, rashes, or cyanosis. Skin warm and dry. Turgor intact.  PSYCHIATRIC: Mood, affect within normal limits. The patient is awake, alert and oriented x 3. Insight, judgment intact.       IMAGING    DG Chest 2 View  Result Date: 02/07/2021 CLINICAL DATA:  Shortness of breath.  Chest pain. EXAM: CHEST - 2 VIEW COMPARISON:  Most recent radiograph and CT 02/01/2021. FINDINGS: Stable prominent cardiac contours post aortic valve replacement and median sternotomy. Unchanged mediastinal contours. Chronic coarse lung markings. No confluent airspace disease, pleural effusion, or pneumothorax. Ground-glass airspace disease on prior CT are not well seen by radiograph. IMPRESSION: No acute chest finding.  Chronic coarse lung markings. Electronically Signed   By: Keith Rake M.D.   On: 02/07/2021 22:47   CT CHEST WO CONTRAST  Result Date: 02/01/2021 CLINICAL DATA:  COPD exacerbation shob and chest pain that started this arm. EXAM: CT CHEST WITHOUT CONTRAST TECHNIQUE: Multidetector CT imaging of the chest was performed following the standard protocol without IV contrast. COMPARISON:  CT angio chest 08/08/2020, CT angiography chest 06/15/2020 FINDINGS: Cardiovascular: Normal heart size. Status post aortic valve replacement. No significant pericardial effusion. The thoracic aorta is normal in caliber. At least moderate atherosclerotic plaque of the thoracic aorta. Four-vessel coronary artery calcifications status post coronary artery stents. The main pulmonary artery is enlarged in caliber measuring up to 3.5 cm. Mediastinum/Nodes: No gross hilar  adenopathy, noting limited sensitivity for the detection of hilar adenopathy on this noncontrast study. No enlarged mediastinal or axillary lymph nodes. Nonspecific debris noted within the esophagus. The thyroid and trachea demonstrate no significant findings. Lungs/Pleura: No focal consolidation. Interval decrease in almost resolved patchy ground-glass airspace opacities with a persistent peribronchovascular nodular-like 1 cm right upper lobe lesion (3:54, 5:78). Interval development of a 1.3 cm ground-glass airspace opacity at the right apex (3:17, 5:104). Trace persistent interlobular septal wall thickening. No pulmonary mass. No pleural effusion. No pneumothorax. Upper Abdomen: Status post cholecystectomy.  No acute abnormality. Musculoskeletal: No chest wall abnormality. No suspicious lytic or blastic osseous lesions. No acute displaced fracture. Sternotomy wires are noted. IMPRESSION: 1. Interval development of a 1.3 cm ground-glass airspace opacity at the right apex. Persistent peribronchovascular nodular like 1 cm right upper lobe ground-glass airspace opacity. Adenocarcinoma cannot be excluded. Follow up by CT is recommended in 12 months, with continued annual surveillance for a minimum of 3 years. These recommendations are taken from: Recommendations for the Management of Subsolid Pulmonary Nodules Detected at CT: A Statement from the [employer] 2013; 266:1, 304-317. 2. Enlarged main pulmonary artery suggestive of hypertension. 3. Debris noted within the esophagus. 4. Aortic Atherosclerosis (ICD10-I70.0) including four-vessel coronary artery calcifications status post stent. 5. Status aortic valve replacement. Electronically Signed   By: Iven Finn M.D.   On: 02/01/2021 17:29   CT Angio Chest PE  W and/or Wo Contrast  Result Date: 02/08/2021 CLINICAL DATA:  PE suspected, high prob. Dyspnea. Nonproductive cough. EXAM: CT ANGIOGRAPHY CHEST WITH CONTRAST TECHNIQUE: Multidetector CT  imaging of the chest was performed using the standard protocol during bolus administration of intravenous contrast. Multiplanar CT image reconstructions and MIPs were obtained to evaluate the vascular anatomy. CONTRAST:  33m OMNIPAQUE IOHEXOL 350 MG/ML SOLN COMPARISON:  02/01/2021 FINDINGS: Cardiovascular: There is adequate opacification of the pulmonary arterial tree. No intraluminal filling defect through the segmental level to suggest acute pulmonary embolism. The central pulmonary arteries are enlarged in keeping with changes of pulmonary arterial hypertension. Extensive multi-vessel coronary artery calcification is noted. Probable stenting of the proximal right and left circumflex coronary arteries. Coronary artery bypass grafting and aortic valve replacement has been performed. Mild global cardiomegaly is present. Moderate largely noncalcified atheromatous plaque seen throughout the aorta. No aortic aneurysm. Mediastinum/Nodes: No pathologic thoracic adenopathy. The esophagus is unremarkable. Lungs/Pleura: Imaging is slightly limited by motion artifact. There is bilateral asymmetric patchy ground-glass pulmonary infiltrate more focal within the mid lung zones bilaterally possibly infectious or inflammatory in nature as can be seen with COVID-19 pneumonia. This has progressed since prior examination. No pneumothorax or pleural effusion. Central airways are widely patent. Upper Abdomen: Cholecystectomy has been performed. No acute abnormality. Musculoskeletal: No acute bone abnormality. No lytic or blastic bone lesion. Review of the MIP images confirms the above findings. IMPRESSION: No pulmonary embolism. Mild cardiomegaly. Morphologic changes in keeping with pulmonary arterial hypertension. Progressive patchy bilateral mid lung zone ground-glass pulmonary infiltrate, possibly infectious or inflammatory in nature. Serologic correlation for COVID-19 pneumonia may be helpful. Aortic Atherosclerosis  (ICD10-I70.0). Electronically Signed   By: AFidela SalisburyM.D.   On: 02/08/2021 02:55   NM Pulmonary Perfusion  Result Date: 01/30/2021 CLINICAL DATA:  Shortness of breath EXAM: NUCLEAR MEDICINE PERFUSION LUNG SCAN TECHNIQUE: Perfusion images were obtained in multiple projections after intravenous injection of radiopharmaceutical. Ventilation scans intentionally deferred if perfusion scan and chest x-ray adequate for interpretation during COVID 19 epidemic. RADIOPHARMACEUTICALS:  4 mCi Tc-955mAA IV COMPARISON:  01/28/2021 FINDINGS: There is adequate uptake of radioactive tracer throughout both lungs. No focal defect to suggest pulmonary embolism is noted. Cardiac shadow is enlarged. IMPRESSION: No focal defect to suggest pulmonary embolism. Electronically Signed   By: MaInez Catalina.D.   On: 01/30/2021 16:07   DG Chest Port 1 View  Result Date: 02/01/2021 CLINICAL DATA:  [age]ear old female with history of shortness of breath. COPD. EXAM: PORTABLE CHEST 1 VIEW COMPARISON:  Chest x-ray 01/30/2021. FINDINGS: Lung volumes are normal. No consolidative airspace disease. No pleural effusions. Diffuse interstitial prominence and widespread peribronchial cuffing. No pneumothorax. No definite suspicious appearing pulmonary nodules or masses are noted. No evidence of pulmonary edema. Mild cardiomegaly. Upper mediastinal contours are within normal limits. Status post hemi median sternotomy for aortic valve replacement with what appears to be a stented bioprosthesis. IMPRESSION: 1. Diffuse interstitial prominence and peribronchial cuffing similar to prior studies suggestive of probable chronic bronchitis. 2. Cardiomegaly. Electronically Signed   By: DaVinnie Langton.D.   On: 02/01/2021 10:49   DG Chest Port 1 View  Result Date: 01/30/2021 CLINICAL DATA:  Post VQ scan EXAM: PORTABLE CHEST 1 VIEW COMPARISON:  01/28/2021, 11/24/2020, 03/04/2018 FINDINGS: Post sternotomy changes with valve replacement. Cardiomegaly  with mild vascular congestion. Diffuse interstitial opacities some of which is felt due to chronic disease. No pleural effusion or pneumothorax. IMPRESSION: 1. Cardiomegaly with slight central congestion and chronic interstitial  opacity. Electronically Signed   By: Donavan Foil M.D.   On: 01/30/2021 16:34   DG Chest Port 1 View  Result Date: 01/28/2021 CLINICAL DATA:  Difficulty breathing.  Shortness of breath. EXAM: PORTABLE CHEST 1 VIEW COMPARISON:  November 24, 2020 FINDINGS: Stable cardiomegaly. The hila and mediastinum are normal. No pneumothorax. No nodules or masses. No focal infiltrates. Increased interstitial opacities in the lungs suggesting mild edema. IMPRESSION: Cardiomegaly and mild pulmonary edema. No other acute abnormalities. Electronically Signed   By: Dorise Bullion III M.D.   On: 01/28/2021 19:17   ECHOCARDIOGRAM COMPLETE  Result Date: 02/02/2021    ECHOCARDIOGRAM REPORT   Patient Name:   KHIYA WESTHOFF Date of Exam: 02/02/2021 Medical Rec #:  [id number]     Height:       66.0 in Accession #:    [id number]    Weight:       203.0 lb Date of Birth:  1946-05-04      BSA:          2.012 m Patient Age:    3 years      BP:           144/61 mmHg Patient Gender: F             HR:           74 bpm. Exam Location:  [facility] Procedure: 2D Echo, Color Doppler and Cardiac Doppler Indications:     Chest pain R07.9  History:         Patient has prior history of Echocardiogram examinations, most                  recent 11/25/2020. Prior CABG, COPD; Risk Factors:Hypertension.  Sonographer:     Sherrie Sport Referring Phys:  [id number] SARA-MAIZ A THOMAS Diagnosing Phys: Serafina Royals MD  Sonographer Comments: Technically challenging study due to limited acoustic windows, suboptimal parasternal window and no apical window. Image acquisition challenging due to COPD. IMPRESSIONS  1. Left ventricular ejection fraction, by estimation, is 55 to 60%. The left ventricle has normal function. The left ventricle has no regional wall  motion abnormalities. Left ventricular diastolic parameters were normal.  2. Right ventricular systolic function is normal. The right ventricular size is normal.  3. The mitral valve is normal in structure. Mild mitral valve regurgitation. Moderate mitral annular calcification.  4. The aortic valve is normal in structure. Aortic valve regurgitation is not visualized. FINDINGS  Left Ventricle: Left ventricular ejection fraction, by estimation, is 55 to 60%. The left ventricle has normal function. The left ventricle has no regional wall motion abnormalities. The left ventricular internal cavity size was normal in size. There is  no left ventricular hypertrophy. Left ventricular diastolic parameters were normal. Right Ventricle: The right ventricular size is normal. No increase in right ventricular wall thickness. Right ventricular systolic function is normal. Left Atrium: Left atrial size was normal in size. Right Atrium: Right atrial size was normal in size. Pericardium: There is no evidence of pericardial effusion. Mitral Valve: The mitral valve is normal in structure. Moderate mitral annular calcification. Mild mitral valve regurgitation. Tricuspid Valve: The tricuspid valve is normal in structure. Tricuspid valve regurgitation is mild. Aortic Valve: The aortic valve is normal in structure. Aortic valve regurgitation is not visualized. Pulmonic Valve: The pulmonic valve was normal in structure. Pulmonic valve regurgitation is not visualized. Aorta: The aortic root and ascending aorta are structurally normal, with no evidence of dilitation. IAS/Shunts: No atrial level  shunt detected by color flow Doppler.  LEFT VENTRICLE PLAX 2D LVIDd:         4.40 cm LVIDs:         3.10 cm LV PW:         1.50 cm LV IVS:        1.80 cm LVOT diam:     2.10 cm LVOT Area:     3.46 cm  LEFT ATRIUM         Index LA diam:    4.70 cm 2.34 cm/m                        PULMONIC VALVE AORTA                 PV Vmax:        0.84 m/s Ao Root  diam: 3.10 cm PV Peak grad:   2.8 mmHg                       RVOT Peak grad: 5 mmHg   SHUNTS Systemic Diam: 2.10 cm Serafina Royals MD Electronically signed by Serafina Royals MD Signature Date/Time: 02/02/2021/4:41:25 PM    Final            ASSESSMENT/PLAN   Acute on chronic hypoxemic respiratory faiulre  -patient has severe multi vessel CAD based on cardiac cath few months ago  - acutely there is evidence of pulmonary edema.  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  Will dc busbar as patient still with sgnificant anxiety, starting xanax low dose tid to help temporarily with anxiety while hospitalized.  Due to recurrent panic attacks she may benefit from proper psychiatric evaluation and medication optimization .     Thank you for allowing me to participate in the care of this patient.  Total face to face encounter time for this patient visit was 45 min. >50% of the time was  spent in counseling and coordination of care.   Patient/Family are satisfied with care plan and all questions have been answered.  This document was prepared using Dragon voice recognition software and may include unintentional dictation errors.     Ottie Glazier, M.D.  Division of [place]

## 2021-02-08 NOTE — ED Notes (Signed)
Pt on call light. Asking when will she be put in a room up stairs. CT in room

## 2021-02-08 NOTE — ED Notes (Signed)
Lab reports troponin results; acuity level changed and charge notified

## 2021-02-08 NOTE — ED Notes (Signed)
Pt on call light. Pt sates she having hard time breathing and would like to lay flat. Pt was put in position of comfort w/ NADN.

## 2021-02-08 NOTE — Progress Notes (Signed)
Pt arrived to floor at this time alert and oriented x4. Vital signs stable. 2L [place]. Orineted to the room and call light within reach. Bed in lowest position. Will continue to monitor.

## 2021-02-08 NOTE — Progress Notes (Signed)
Brief  Gastroenterology consult note  Admitting diagnosis: Dyspnea on Exertion, possible ACS, possible PTE. Gastroenterology Consultation requested for "GI bleed". Actually appears to be mild anemia with hemoccult positive stool with no reports of melena or hematochezia.  Patient seen by consultant today  Consult note dictated:TBA  Recommend to proceed with surgery or procedure: NO procedures indicated at this time.  Recommend further assessment or treatment: In absence of overt gastrointestinal hemorrhage paired with current COPD exacerbation, angina,I  would favor elective outpatient evaluation. May consider inpatient endoluminal evaluation depending upon clinical course and pending cardiac evaluation.  Orders entered:None  Discussed with Attending WC:[id number]     Comments:Formal consult to follow later today.   Olean Ree, MD  8:28 AM 02/08/2021

## 2021-02-08 NOTE — H&P (Addendum)
[place]   PATIENT NAME: [name]    MR#:  [id number]  DATE OF BIRTH:  [birth year]  DATE OF ADMISSION:  02/08/2021  PRIMARY CARE PHYSICIAN: Rusty Aus, MD   Patient is coming from: Home  REQUESTING/REFERRING PHYSICIAN: Ward, Delice Bison, DO  CHIEF COMPLAINT:   Chief Complaint  Patient presents with  . Shortness of Breath    HISTORY OF PRESENT ILLNESS:  [name] is a 75 y.o. Caucasian female with medical history significant for asthma, COPD, coronary artery disease status post CABG, status post PCI, type diabetes mellitus, GERD, headache and anxiety, who presented to the ER with acute onset of worsening dyspnea today with associated chest tightness that is diffuse without radiation and associated nonproductive cough with dyspnea he was recently admitted here and had a negative VQ scan.  She denied any leg pain or worsening edema or recent travels or surgeries.  No melena or bright red bleeding per rectum.  No dysuria, oliguria or hematuria or flank pain.  She admits to dyspnea on exertion, orthopnea and paroxysmal nocturnal dyspnea without worsening lower extremity edema.  She has been fairly anxious during the interview.    ED Course: Upon presentation to the ER blood pressure was 157/71 with otherwise normal vital signs.  Labs revealed mild hyponatremia 131 and hyperkalemia 5.3 with hyperglycemia of 233.  BNP came back elevated at 423 and high-sensitivity troponin I was 100 and later 113.  Stool Hemoccult came back positive with brown stool per  ER physician.   EKG as reviewed by me : EKG showed normal social rhythm with a rate of 82 with first-degree AV block and minimal voltage criteria for LVH and T wave inversion laterally with prolonged QT interval with QTC 472 MS. Imaging: Two-view chest x-ray showed chronic coarse lung markings with no acute chest findings.  He has chest CTA is currently pending   The patient was given nebulized albuterol 5 mg, 40 mg of IV Lasix  and 125 mg of IV Solu-Medrol.  The patient will be admitted to progressive unit bed for further evaluation and management.   PAST MEDICAL HISTORY:   Past Medical History:  Diagnosis Date  . 1st degree AV block   . ACE-inhibitor cough   . Allergic rhinitis   . Anemia    iron deficiency anemia  . Anxiety   . Aortic ectasia (HCC)    a. CT abd in 12/2016 incidentally noted aortic atherosclerosis and infrarenal abdominal aortic ectasia measuring as large as 2.7 cm with recommendation to repeat US in 2023.  Marland Kitchen Arthritis   . Asthma   . Cataract   . Chronic depression   . Chronic diastolic CHF (congestive heart failure) ([place])   . Chronic headache   . COPD (chronic obstructive pulmonary disease) ([place])   . Coronary artery disease    a. DES to RCA and mid Cx 2009. b. CABG and bioprosthetic AVR May 2015. c. cutting balloon to prox Cx in 05/2016  . Diabetes mellitus    type 2  . Diverticulitis of colon   . Essential hypertension   . GERD (gastroesophageal reflux disease)   . Hearing loss   . History of blood transfusion 2013  . History of prosthetic aortic valve replacement   . HOH (hard of hearing)   . Hypercholesterolemia    intolerance of statins and niaspan  . IDA (iron deficiency anemia) 02/03/2019  . Mobitz type 1 second degree AV block   .  OSA (obstructive sleep apnea)    mild, intolerant of cpap  . PAD (peripheral artery disease) ([place])    a. atherosclerosis by CT abd 12/2016 in [place].  Marland Kitchen PONV (postoperative nausea and vomiting)   . Statin intolerance   . Thyroid disease     PAST SURGICAL HISTORY:   Past Surgical History:  Procedure Laterality Date  . ABDOMINAL HYSTERECTOMY    . ABDOMINAL HYSTERECTOMY W/ PARTIAL VAGINACTOMY    . AORTIC VALVE REPLACEMENT N/A 10/12/2013   Procedure: AORTIC VALVE REPLACEMENT (AVR);  Surgeon: [name], MD;  Location: [facility];  Service: Open Heart Surgery;  Laterality: N/A;  . [place]  . BARTHOLIN GLAND CYST EXCISION    . BLADDER  SUSPENSION    . BREAST BIOPSY Bilateral 09/11/2000   neg  . BREAST BIOPSY Left 07/24/2010   neg  . BREAST CYST EXCISION  1988   bilateral nonmalignant tumors, x3  . CARDIAC CATHETERIZATION    . CARDIAC CATHETERIZATION N/A 05/25/2016   Procedure: Coronary Balloon Angioplasty;  Surgeon: Leonie Man, MD;  Location: [facility];  Service: Cardiovascular;  Laterality: N/A;  . CARDIAC CATHETERIZATION N/A 05/25/2016   Procedure: Coronary/Graft Angiography;  Surgeon: Leonie Man, MD;  Location: [facility];  Service: Cardiovascular;  Laterality: N/A;  . CATARACT EXTRACTION W/ INTRAOCULAR LENS  IMPLANT, BILATERAL    . CHOLECYSTECTOMY  2001  . COLECTOMY     lap sigmoid  . COLONOSCOPY  2014   polyps found, 2 clamped off.  . CORONARY ANGIOPLASTY  10/29/2007   Prox RCA & Mid Cx.  . CORONARY ARTERY BYPASS GRAFT N/A 10/12/2013   Procedure: CORONARY ARTERY BYPASS GRAFT TIMES TWO;  Surgeon: [name], MD;  Location: [facility];  Service: Open Heart Surgery;  Laterality: N/A;  . CORONARY/GRAFT ANGIOGRAPHY N/A 09/20/2017   Procedure: CORONARY/GRAFT ANGIOGRAPHY;  Surgeon: Sherren Mocha, MD;  Location: [facility];  Service: Cardiovascular;  Laterality: N/A;  . LEFT HEART CATHETERIZATION WITH CORONARY ANGIOGRAM N/A 10/09/2013   Procedure: LEFT HEART CATHETERIZATION WITH CORONARY ANGIOGRAM;  Surgeon: [name], MD;  [facility];  Service: Cardiovascular;  Laterality: N/A;  . RIGHT/LEFT HEART CATH AND CORONARY ANGIOGRAPHY N/A 11/03/2020   Procedure: RIGHT/LEFT HEART CATH AND CORONARY ANGIOGRAPHY;  Surgeon: Corey Skains, MD;  Location: [facility];  Service: Cardiovascular;  Laterality: N/A;  . STERNAL WIRES REMOVAL N/A 04/13/2014   Procedure: STERNAL WIRES REMOVAL;  Surgeon: [name], MD;  Location: [facility];  Service: Thoracic;  Laterality: N/A;  . TEE WITHOUT CARDIOVERSION N/A 11/03/2020   Procedure: TRANSESOPHAGEAL ECHOCARDIOGRAM (TEE);  Surgeon:  Corey Skains, MD;  Location: [facility];  Service: Cardiovascular;  Laterality: N/A;  . THYROIDECTOMY    . TONSILLECTOMY    . TUBAL LIGATION    . VAGINAL DELIVERY     3  . VISCERAL ARTERY INTERVENTION N/A 08/16/2016   Procedure: Visceral Artery Intervention;  Surgeon: Algernon Huxley, MD;  Location: [facility];  Service: Cardiovascular;  Laterality: N/A;    SOCIAL HISTORY:   Social History   Tobacco Use  . Smoking status: Former    Packs/day: 0.50    Years: 30.00    Pack years: 15.00    Types: Cigarettes    Quit date: 10/02/2013    Years since quitting: 7.3  . Smokeless tobacco: Never  Substance Use Topics  . Alcohol use: No    FAMILY HISTORY:  Family History  Problem Relation Age of Onset  . Breast cancer Mother 69  . Hypertension Father   . Mesothelioma Father   . Asthma Father   . Stroke Paternal Grandfather   . Heart disease Other   . Breast cancer Maternal Aunt   . Breast cancer Paternal Aunt     DRUG ALLERGIES:   Allergies  Allergen Reactions  . Amitriptyline Other (See Comments)    Unknown reaction  . Benadryl [Diphenhydramine] Shortness Of Breath  . Demerol [Meperidine] Other (See Comments)    Unknown reaction  . Gabapentin Other (See Comments)    Unknown reaction  . Meperidine Hcl Other (See Comments)    Unknown reaction  . Mirtazapine Other (See Comments)    Unknown reaction  . Olanzapine Other (See Comments)    Unknown reaction   . Voltaren [Diclofenac Sodium] Shortness Of Breath  . Zetia [Ezetimibe] Other (See Comments)    Weakness in legs, shakiness all over  . Ativan [Lorazepam] Other (See Comments)    Causes double vision at highter than .5 mg dose  . Atorvastatin Other (See Comments)    Muscle aches and weakness  . Budesonide-Formoterol Fumarate Other (See Comments)    Shakiness, tremors  . Bupropion Hcl Other (See Comments)    "cloud over me" depression  . Caffeine Other (See Comments)    jitters  . Codeine Sulfate Other  (See Comments)    Makes chest hurt like a heart attack  . Lisinopril Cough  . Metformin Nausea And Vomiting  . Mometasone Furoate Nausea And Vomiting  . Morphine Sulfate Other (See Comments)    Chest pain like a heart attack  . Other Other (See Comments)    Beta Blockers, reaction shortness of breath  . Oxycodone-Acetaminophen Nausea And Vomiting  . Pioglitazone Other (See Comments)    Cannot take because of risk of bladder cancer  . Propoxyphene N-Acetaminophen Nausea And Vomiting  . Rosuvastatin Other (See Comments)    Muscle aches and weakness  . Shellfish Allergy Diarrhea  . Suvorexant Other (See Comments)    Jerking/nervous   . Ticagrelor     Other reaction(s): Other (See Comments) "slowed heart rate" & chest pain  . Tramadol Nausea Only  . Trazodone And Nefazodone Nausea And Vomiting  . Venlafaxine Other (See Comments)    Unknown reaction  . Zolpidem Tartrate Other (See Comments)     Jittery, diarrhea  . Latex Rash    REVIEW OF SYSTEMS:   ROS As per history of present illness. All pertinent systems were reviewed above. Constitutional, HEENT, cardiovascular, respiratory, GI, GU, musculoskeletal, neuro, psychiatric, endocrine, integumentary and hematologic systems were reviewed and are otherwise negative/unremarkable except for positive findings mentioned above in the HPI.   MEDICATIONS AT HOME:   Prior to Admission medications   Medication Sig Start Date End Date Taking? Authorizing Provider  acetaminophen (TYLENOL) 500 MG tablet Take 500-1,000 mg by mouth every 6 (six) hours as needed (pain).    [provider]  albuterol (PROVENTIL) (2.5 MG/3ML) 0.083% nebulizer solution Take 2.5 mg by nebulization every 4 (four) hours as needed for shortness of breath or wheezing. 06/07/20   [provider]  ANORO ELLIPTA 62.5-25 MCG/INH AEPB Inhale 1 puff into the lungs daily. 10/11/20   [provider]  aspirin EC 81 MG EC tablet Take 1 tablet (81 mg total)  by mouth daily. Swallow whole. 08/10/20   Samuella Cota, MD  Calcium-Vitamin D (CALTRATE 600 PLUS-VIT D PO) Take 1  tablet by mouth 2 (two) times daily.    [provider]  clopidogrel (PLAVIX) 75 MG tablet Take 75 mg by mouth daily. 11/15/20   [provider]  DULoxetine (CYMBALTA) 20 MG capsule Take 1 capsule (20 mg total) by mouth in the morning. 11/26/20   Jennye Boroughs, MD  FLUoxetine (PROZAC) 40 MG capsule Take 40 mg by mouth 2 (two) times daily. 08/23/20   [provider]  hydrOXYzine (ATARAX/VISTARIL) 25 MG tablet Take 1 tablet (25 mg total) by mouth 3 (three) times daily. 02/02/21 03/04/21  Mariel Aloe, MD  insulin glargine (SEMGLEE) 100 UNIT/ML injection Inject 100 Units into the skin at bedtime.    [provider]  insulin lispro (HUMALOG) 100 UNIT/ML injection Inject 0-54 Units into the skin 4 (four) times daily -  before meals and at bedtime.    [provider]  insulin NPH-regular Human (NOVOLIN 70/30) (70-30) 100 UNIT/ML injection Inject 60 Units into the skin 2 (two) times daily with a meal. Inject 60u under the skin every morning at breakfast and inject 60u under the skin every evening with supper    [provider]  isosorbide mononitrate (IMDUR) 30 MG 24 hr tablet Take 1 tablet (30 mg total) by mouth daily. 11/03/20 11/03/21  Corey Skains, MD  levothyroxine (SYNTHROID, LEVOTHROID) 125 MCG tablet Take 125 mcg by mouth daily before breakfast. 04/03/17   [provider]  losartan (COZAAR) 25 MG tablet Take 1 tablet (25 mg total) by mouth in the morning. 11/26/20   Jennye Boroughs, MD  nystatin (MYCOSTATIN) 100000 UNIT/ML suspension Take by mouth. 11/21/20   [provider]  pantoprazole (PROTONIX) 40 MG tablet Take 40 mg by mouth in the morning.    [provider]  potassium chloride (KLOR-CON) 10 MEQ tablet Take 1 tablet (10 mEq total) by mouth daily. 11/26/20   Jennye Boroughs, MD  promethazine (PHENERGAN)  25 MG tablet Take 25 mg by mouth every 6 (six) hours as needed for nausea or vomiting.    [provider]  torsemide (DEMADEX) 20 MG tablet Take 20 mg by mouth daily. 12/08/20   [provider]  traZODone (DESYREL) 100 MG tablet Take 200 mg by mouth at bedtime.    [provider]      VITAL SIGNS:  Blood pressure 122/63, pulse 77, temperature 99 F (37.2 C), temperature source Oral, resp. rate 19, height '5\' 6"'$  (1.676 m), weight 90.7 kg, SpO2 100 %.  PHYSICAL EXAMINATION:  Physical Exam  GENERAL:  75 y.o.-year-old Caucasian female patient lying in the bed with no acute distress.  EYES: Pupils equal, round, reactive to light and accommodation. No scleral icterus. Extraocular muscles intact.  HEENT: Head atraumatic, normocephalic. Oropharynx and nasopharynx clear.  NECK:  Supple, no jugular venous distention. No thyroid enlargement, no tenderness.  LUNGS: Normal breath sounds bilaterally, no wheezing, rales,rhonchi or crepitation. No use of accessory muscles of respiration.  CARDIOVASCULAR: Regular rate and rhythm, S1, S2 normal. No murmurs, rubs, or gallops.  ABDOMEN: Soft, nondistended, nontender. Bowel sounds present. No organomegaly or mass.  EXTREMITIES: No pedal edema, cyanosis, or clubbing.  NEUROLOGIC: Cranial nerves II through XII are intact. Muscle strength 5/5 in all extremities. Sensation intact. Gait not checked.  PSYCHIATRIC: The patient is alert and oriented x 3.  Normal affect and good eye contact. SKIN: No obvious rash, lesion, or ulcer.   LABORATORY PANEL:   CBC Recent Labs  Lab 02/07/21 2306  WBC 10.4  HGB 8.9*  HCT 26.6*  PLT 167   ------------------------------------------------------------------------------------------------------------------  Chemistries  Recent Labs  Lab 02/02/21 0359 02/07/21 2306  NA 129* 131*  K 5.2* 5.3*  CL 93* 100  CO2 28 25  GLUCOSE 444* 233*  BUN 43* 17  CREATININE 1.26* 1.04*  CALCIUM 8.3* 8.0*   AST 41  --   ALT 30  --   ALKPHOS 81  --   BILITOT 0.4  --    ------------------------------------------------------------------------------------------------------------------  Cardiac Enzymes No results for input(s): TROPONINI in the last 168 hours. ------------------------------------------------------------------------------------------------------------------  RADIOLOGY:  DG Chest 2 View  Result Date: 02/07/2021 CLINICAL DATA:  Shortness of breath.  Chest pain. EXAM: CHEST - 2 VIEW COMPARISON:  Most recent radiograph and CT 02/01/2021. FINDINGS: Stable prominent cardiac contours post aortic valve replacement and median sternotomy. Unchanged mediastinal contours. Chronic coarse lung markings. No confluent airspace disease, pleural effusion, or pneumothorax. Ground-glass airspace disease on prior CT are not well seen by radiograph. IMPRESSION: No acute chest finding.  Chronic coarse lung markings. Electronically Signed   By: Keith Rake M.D.   On: 02/07/2021 22:47      IMPRESSION AND PLAN:  Active Problems:   COPD exacerbation ([place])  1.  Chest tightness/pain with elevated troponin I, rule out ACS, rule out PE. - The patient will be admitted to a progressive unit bed. - We will follow chest CTA results. - We will obtain bilateral lower extremity venous Doppler to rule out DVT. - Cardiology consult will be obtained. - 2D echo be obtained. - I notified Dr. Corky Sox about the patient.  2.  Suspected GI bleeding as evidenced by positive stool Hemoccult. - Will be followed with serial hemoglobins and hematocrits. - We will place her on IV PPI therapy with bolus and drip. - We will obtain a GI consultation. - I notified Dr. Alice Reichert about the patient.   3.  COPD exacerbation. - We will continue the patient on steroid therapy and scheduled and as needed DuoNebs. - We will hold off Anora Ellipta for now.  4.  Depression. - We will continue Prozac and Cymbalta.  5.   Hypothyroidism. - We will continue Synthroid.  6.  Type 2 diabetes mellitus on insulin therapy. - We will continue her basal coverage and place her on supplement coverage with NovoLog.  7.  Hypertension. - We will continue Cozaar and Imdur.  DVT prophylaxis: SCDs.  Medical prophylaxis and anticoagulation indicated due to GI bleeding. Code Status: full code. Family Communication:  The plan of care was discussed in details with the patient (and family). I answered all questions. The patient agreed to proceed with the above mentioned plan. Further management will depend upon hospital course. Disposition Plan: Back to previous home environment Consults called: Cardiology All the records are reviewed and case discussed with ED provider.  Status is: Inpatient  Remains inpatient appropriate because:Ongoing diagnostic testing needed not appropriate for outpatient work up, Unsafe d/c plan, IV treatments appropriate due to intensity of illness or inability to take PO, and Inpatient level of care appropriate due to severity of illness  Dispo: The patient is from: Home              Anticipated d/c is to: Home              Patient currently is medically stable to d/c.   Difficult to place patient No   TOTAL TIME TAKING CARE OF THIS PATIENT:     minutes.    Luann Aspinwall A Bradan Congrove M.D  on 02/08/2021 at 1:27 AM  [facility]   From 7 PM-7 AM, contact night-coverage [email or web address]  CC: Primary care physician; Rusty Aus, MD

## 2021-02-08 NOTE — Consult Note (Signed)
[name] Cardiology  CARDIOLOGY CONSULT NOTE  Patient ID: [name] MRN: [id number] DOB/AGE: [birth year] 75 y.o.  Admit date: 02/08/2021 Referring Physician Eugenie Norrie Primary Physician Rusty Aus, MD Primary Cardiologist Nehemiah Massed Reason for Consultation GI bleed, blood thinners  HPI:  [name] is a 75 year old female with history of coronary artery disease (CABG and AVR 2015, balloon angioplasty to proximal circumflex in 2017, DES to mid left circumflex and SVG to RCA in 11/14/2020), type 2 diabetes, hypertension, hyperlipidemia, prior GI bleeding following polypectomy in 2014 who was admitted with shortness of breath.  She was seen by her outpatient cardiologist, Dr. Nehemiah Massed, in June 2022 at which time she was complaining of some shortness of breath that had worsened over the last several months.  She had an echo that showed a peak gradient of 3.8 m/s, increased from prior 2 years ago when it was 2.2 m/s.  History is difficult because patient is hard of hearing and she keeps telling me she would like to leave. She says that she has chronic shortness of breath and this worsened slightly prompting her presentation yesterday morning. She currently feels back to normal. She says she improved with BiPAP and lasix 40 mg IV x 1. Now she wants to go home because "I can take meds at home."  She has not had any bloody or dark appearing stools. No LE edema, orthopnea, PND. She has gained a small amount of weight which she attributes to eating too much.   Review of systems complete and found to be negative unless listed above     Past Medical History:  Diagnosis Date   1st degree AV block    ACE-inhibitor cough    Allergic rhinitis    Anemia    iron deficiency anemia   Anxiety    Aortic ectasia (HCC)    a. CT abd in 12/2016 incidentally noted aortic atherosclerosis and infrarenal abdominal aortic ectasia measuring as large as 2.7 cm with recommendation to repeat US in 2023.   Arthritis     Asthma    Cataract    Chronic depression    Chronic diastolic CHF (congestive heart failure) (HCC)    Chronic headache    COPD (chronic obstructive pulmonary disease) (HCC)    Coronary artery disease    a. DES to RCA and mid Cx 2009. b. CABG and bioprosthetic AVR May 2015. c. cutting balloon to prox Cx in 05/2016   Diabetes mellitus    type 2   Diverticulitis of colon    Essential hypertension    GERD (gastroesophageal reflux disease)    Hearing loss    History of blood transfusion 2013   History of prosthetic aortic valve replacement    HOH (hard of hearing)    Hypercholesterolemia    intolerance of statins and niaspan   IDA (iron deficiency anemia) 02/03/2019   Mobitz type 1 second degree AV block    OSA (obstructive sleep apnea)    mild, intolerant of cpap   PAD (peripheral artery disease) ([place])    a. atherosclerosis by CT abd 12/2016 in LE.   PONV (postoperative nausea and vomiting)    Statin intolerance    Thyroid disease     Past Surgical History:  Procedure Laterality Date   ABDOMINAL HYSTERECTOMY     ABDOMINAL HYSTERECTOMY W/ PARTIAL VAGINACTOMY     AORTIC VALVE REPLACEMENT N/A 10/12/2013   Procedure: AORTIC VALVE REPLACEMENT (AVR);  Surgeon: [name], MD;  Location: [place];  Service:  Open Heart Surgery;  Laterality: N/A;   APPENDECTOMY  1964   BARTHOLIN GLAND CYST EXCISION     BLADDER SUSPENSION     BREAST BIOPSY Bilateral 09/11/2000   neg   BREAST BIOPSY Left 07/24/2010   neg   BREAST CYST EXCISION  1988   bilateral nonmalignant tumors, x3   CARDIAC CATHETERIZATION     CARDIAC CATHETERIZATION N/A 05/25/2016   Procedure: Coronary Balloon Angioplasty;  Surgeon: Leonie Man, MD;  Location: [place] [facility];  Service: Cardiovascular;  Laterality: N/A;   CARDIAC CATHETERIZATION N/A 05/25/2016   Procedure: Coronary/Graft Angiography;  Surgeon: Leonie Man, MD;  Location: [place] [facility];  Service: Cardiovascular;  Laterality: N/A;   CATARACT EXTRACTION  W/ INTRAOCULAR LENS  IMPLANT, BILATERAL     CHOLECYSTECTOMY  2001   COLECTOMY     lap sigmoid   COLONOSCOPY  2014   polyps found, 2 clamped off.   CORONARY ANGIOPLASTY  10/29/2007   Prox RCA & Mid Cx.   CORONARY ARTERY BYPASS GRAFT N/A 10/12/2013   Procedure: CORONARY ARTERY BYPASS GRAFT TIMES TWO;  Surgeon: [name], MD;  Location: [place] [facility];  Service: Open Heart Surgery;  Laterality: N/A;   CORONARY/GRAFT ANGIOGRAPHY N/A 09/20/2017   Procedure: CORONARY/GRAFT ANGIOGRAPHY;  Surgeon: Sherren Mocha, MD;  Location: [place] [facility];  Service: Cardiovascular;  Laterality: N/A;   LEFT HEART CATHETERIZATION WITH CORONARY ANGIOGRAM N/A 10/09/2013   Procedure: LEFT HEART CATHETERIZATION WITH CORONARY ANGIOGRAM;  Surgeon: [name], MD;  Location: [facility];  Service: Cardiovascular;  Laterality: N/A;   RIGHT/LEFT HEART CATH AND CORONARY ANGIOGRAPHY N/A 11/03/2020   Procedure: RIGHT/LEFT HEART CATH AND CORONARY ANGIOGRAPHY;  Surgeon: Corey Skains, MD;  Location: [place] [facility];  Service: Cardiovascular;  Laterality: N/A;   STERNAL WIRES REMOVAL N/A 04/13/2014   Procedure: STERNAL WIRES REMOVAL;  Surgeon: [name], MD;  Location: [place];  Service: Thoracic;  Laterality: N/A;   TEE WITHOUT CARDIOVERSION N/A 11/03/2020   Procedure: TRANSESOPHAGEAL ECHOCARDIOGRAM (TEE);  Surgeon: Corey Skains, MD;  Location: [facility];  Service: Cardiovascular;  Laterality: N/A;   THYROIDECTOMY     TONSILLECTOMY     TUBAL LIGATION     VAGINAL DELIVERY     3   VISCERAL ARTERY INTERVENTION N/A 08/16/2016   Procedure: Visceral Artery Intervention;  Surgeon: Algernon Huxley, MD;  Location: [place] [facility];  Service: Cardiovascular;  Laterality: N/A;    Medications Prior to Admission  Medication Sig Dispense Refill Last Dose   ANORO ELLIPTA 62.5-25 MCG/INH AEPB Inhale 1 puff into the lungs daily.   unknown at unknown   aspirin EC 81 MG EC tablet Take 1 tablet (81 mg total) by mouth  daily. Swallow whole. 30 tablet 11 unknown at unknown   Calcium-Vitamin D (CALTRATE 600 PLUS-VIT D PO) Take 1 tablet by mouth 2 (two) times daily.   unknown at unknown   clopidogrel (PLAVIX) 75 MG tablet Take 75 mg by mouth daily.   unknown at unknown   DULoxetine (CYMBALTA) 20 MG capsule Take 1 capsule (20 mg total) by mouth in the morning.   unknown at unknown   FLUoxetine (PROZAC) 40 MG capsule Take 40 mg by mouth 2 (two) times daily.   unknown at unknown   hydrOXYzine (ATARAX/VISTARIL) 25 MG tablet Take 1 tablet (25 mg total) by mouth 3 (three) times daily. 90 tablet 0 unknown at unknown   insulin lispro (HUMALOG) 100 UNIT/ML injection  Inject 0-54 Units into the skin 4 (four) times daily -  before meals and at bedtime.   unknown at unknown   insulin NPH-regular Human (NOVOLIN 70/30) (70-30) 100 UNIT/ML injection Inject 60 Units into the skin 2 (two) times daily with a meal. Inject 60u under the skin every morning at breakfast and inject 60u under the skin every evening with supper   unknown at unknown   isosorbide mononitrate (IMDUR) 30 MG 24 hr tablet Take 1 tablet (30 mg total) by mouth daily. 30 tablet 11 unknown at unknown   levothyroxine (SYNTHROID, LEVOTHROID) 125 MCG tablet Take 125 mcg by mouth daily before breakfast.   unknown at unknown   losartan (COZAAR) 25 MG tablet Take 1 tablet (25 mg total) by mouth in the morning.   unknown at unknown   pantoprazole (PROTONIX) 40 MG tablet Take 40 mg by mouth in the morning.   unknown at unknown   potassium chloride (KLOR-CON) 10 MEQ tablet Take 1 tablet (10 mEq total) by mouth daily. 30 tablet 0    torsemide (DEMADEX) 20 MG tablet Take 20 mg by mouth daily.   unknown at unknown   traZODone (DESYREL) 100 MG tablet Take 200 mg by mouth at bedtime.   unknown at unknown   acetaminophen (TYLENOL) 500 MG tablet Take 500-1,000 mg by mouth every 6 (six) hours as needed (pain).   prn at prn   albuterol (PROVENTIL) (2.5 MG/3ML) 0.083% nebulizer solution  Take 2.5 mg by nebulization every 4 (four) hours as needed for shortness of breath or wheezing.   prn at prn   insulin glargine (LANTUS) 100 UNIT/ML injection Inject 100 Units into the skin at bedtime. (Patient not taking: Reported on 02/08/2021)   Not Taking at unknown   nystatin (MYCOSTATIN) 100000 UNIT/ML suspension Take by mouth.   prn at prn   promethazine (PHENERGAN) 25 MG tablet Take 25 mg by mouth every 6 (six) hours as needed for nausea or vomiting.   prn at prn   Social History   Socioeconomic History   Marital status: Married    Spouse name: Not on file   Number of children: 3   Years of education: Not on file   Highest education level: Not on file  Occupational History   Occupation: Retired    Fish farm manager: UNEMPLOYED    Comment: CNA  Tobacco Use   Smoking status: Former    Packs/day: 0.50    Years: 30.00    Pack years: 15.00    Types: Cigarettes    Quit date: 10/02/2013    Years since quitting: 7.3   Smokeless tobacco: Never  Vaping Use   Vaping Use: Never used  Substance and Sexual Activity   Alcohol use: No   Drug use: No   Sexual activity: Not Currently  Other Topics Concern   Not on file  Social History Narrative   Does not have Living Will   Desires CPR, would not want prolonged life support if futile.   Social Determinants of Health   Financial Resource Strain: Not on file  Food Insecurity: Not on file  Transportation Needs: Not on file  Physical Activity: Not on file  Stress: Not on file  Social Connections: Not on file  Intimate Partner Violence: Not on file    Family History  Problem Relation Age of Onset   Breast cancer Mother 17   Hypertension Father    Mesothelioma Father    Asthma Father    Stroke Paternal Grandfather  Heart disease Other    Breast cancer Maternal Aunt    Breast cancer Paternal Aunt       Review of systems complete and found to be negative unless listed above    PHYSICAL EXAM  General: Well developed, well  nourished, appears anxious.  HEENT:  Normocephalic and atramatic Neck:  No JVD.  Lungs: Clear bilaterally to auscultation and percussion. Heart: HRRR . [id number] systolic murmur; no crisp closure sound.  Abdomen: Bowel sounds are positive, abdomen soft and non-tender  Msk:  Back normal, normal gait. Normal strength and tone for age. Extremities: No clubbing, cyanosis or edema.   Neuro: Alert and oriented X 3. Psych:  Good affect, responds appropriately  Labs:   Lab Results  Component Value Date   WBC 9.4 02/08/2021   HGB 8.9 (L) 02/08/2021   HCT 27.4 (L) 02/08/2021   MCV 86.2 02/08/2021   PLT 171 02/08/2021    Recent Labs  Lab 02/02/21 0359 02/07/21 2306 02/08/21 0630  NA 129*   < > 131*  K 5.2*   < > 4.7  CL 93*   < > 96*  CO2 28   < > 25  BUN 43*   < > 20  CREATININE 1.26*   < > 1.22*  CALCIUM 8.3*   < > 7.9*  PROT 6.6  --   --   BILITOT 0.4  --   --   ALKPHOS 81  --   --   ALT 30  --   --   AST 41  --   --   GLUCOSE 444*   < > 429*   < > = values in this interval not displayed.   Lab Results  Component Value Date   CKTOTAL 101 05/26/2016   CKMB 8.6 (H) 05/26/2016   TROPONINI <0.03 03/04/2018    Lab Results  Component Value Date   CHOL 173 09/20/2017   CHOL 206 (H) 03/16/2015   CHOL 220 (H) 07/07/2014   Lab Results  Component Value Date   HDL 22 (L) 09/20/2017   HDL 21 (L) 03/16/2015   HDL 30.20 (L) 07/07/2014   Lab Results  Component Value Date   LDLCALC 110 (H) 09/20/2017   LDLCALC 111 03/16/2015   LDLCALC 135 (H) 10/03/2013   Lab Results  Component Value Date   TRIG 207 (H) 09/20/2017   TRIG 369 (H) 03/16/2015   TRIG 329.0 (H) 07/07/2014   Lab Results  Component Value Date   CHOLHDL 7.9 09/20/2017   CHOLHDL 9.8 (H) 03/16/2015   CHOLHDL 7 07/07/2014   Lab Results  Component Value Date   LDLDIRECT 127.0 07/07/2014   LDLDIRECT 172.6 12/24/2008   LDLDIRECT 100.8 11/11/2007      Radiology: DG Chest 2 View  Result Date:  02/07/2021 CLINICAL DATA:  Shortness of breath.  Chest pain. EXAM: CHEST - 2 VIEW COMPARISON:  Most recent radiograph and CT 02/01/2021. FINDINGS: Stable prominent cardiac contours post aortic valve replacement and median sternotomy. Unchanged mediastinal contours. Chronic coarse lung markings. No confluent airspace disease, pleural effusion, or pneumothorax. Ground-glass airspace disease on prior CT are not well seen by radiograph. IMPRESSION: No acute chest finding.  Chronic coarse lung markings. Electronically Signed   By: Keith Rake M.D.   On: 02/07/2021 22:47   CT CHEST WO CONTRAST  Result Date: 02/01/2021 CLINICAL DATA:  COPD exacerbation shob and chest pain that started this arm. EXAM: CT CHEST WITHOUT CONTRAST TECHNIQUE: Multidetector CT imaging of the chest was  performed following the standard protocol without IV contrast. COMPARISON:  CT angio chest 08/08/2020, CT angiography chest 06/15/2020 FINDINGS: Cardiovascular: Normal heart size. Status post aortic valve replacement. No significant pericardial effusion. The thoracic aorta is normal in caliber. At least moderate atherosclerotic plaque of the thoracic aorta. Four-vessel coronary artery calcifications status post coronary artery stents. The main pulmonary artery is enlarged in caliber measuring up to 3.5 cm. Mediastinum/Nodes: No gross hilar adenopathy, noting limited sensitivity for the detection of hilar adenopathy on this noncontrast study. No enlarged mediastinal or axillary lymph nodes. Nonspecific debris noted within the esophagus. The thyroid and trachea demonstrate no significant findings. Lungs/Pleura: No focal consolidation. Interval decrease in almost resolved patchy ground-glass airspace opacities with a persistent peribronchovascular nodular-like 1 cm right upper lobe lesion (3:54, 5:78). Interval development of a 1.3 cm ground-glass airspace opacity at the right apex (3:17, 5:104). Trace persistent interlobular septal wall  thickening. No pulmonary mass. No pleural effusion. No pneumothorax. Upper Abdomen: Status post cholecystectomy.  No acute abnormality. Musculoskeletal: No chest wall abnormality. No suspicious lytic or blastic osseous lesions. No acute displaced fracture. Sternotomy wires are noted. IMPRESSION: 1. Interval development of a 1.3 cm ground-glass airspace opacity at the right apex. Persistent peribronchovascular nodular like 1 cm right upper lobe ground-glass airspace opacity. Adenocarcinoma cannot be excluded. Follow up by CT is recommended in 12 months, with continued annual surveillance for a minimum of 3 years. These recommendations are taken from: Recommendations for the Management of Subsolid Pulmonary Nodules Detected at CT: A Statement from the [facility] 2013; 266:1, 304-317. 2. Enlarged main pulmonary artery suggestive of hypertension. 3. Debris noted within the esophagus. 4. Aortic Atherosclerosis (ICD10-I70.0) including four-vessel coronary artery calcifications status post stent. 5. Status aortic valve replacement. Electronically Signed   By: Iven Finn M.D.   On: 02/01/2021 17:29   CT Angio Chest PE W and/or Wo Contrast  Result Date: 02/08/2021 CLINICAL DATA:  PE suspected, high prob. Dyspnea. Nonproductive cough. EXAM: CT ANGIOGRAPHY CHEST WITH CONTRAST TECHNIQUE: Multidetector CT imaging of the chest was performed using the standard protocol during bolus administration of intravenous contrast. Multiplanar CT image reconstructions and MIPs were obtained to evaluate the vascular anatomy. CONTRAST:  21m OMNIPAQUE IOHEXOL 350 MG/ML SOLN COMPARISON:  02/01/2021 FINDINGS: Cardiovascular: There is adequate opacification of the pulmonary arterial tree. No intraluminal filling defect through the segmental level to suggest acute pulmonary embolism. The central pulmonary arteries are enlarged in keeping with changes of pulmonary arterial hypertension. Extensive multi-vessel coronary  artery calcification is noted. Probable stenting of the proximal right and left circumflex coronary arteries. Coronary artery bypass grafting and aortic valve replacement has been performed. Mild global cardiomegaly is present. Moderate largely noncalcified atheromatous plaque seen throughout the aorta. No aortic aneurysm. Mediastinum/Nodes: No pathologic thoracic adenopathy. The esophagus is unremarkable. Lungs/Pleura: Imaging is slightly limited by motion artifact. There is bilateral asymmetric patchy ground-glass pulmonary infiltrate more focal within the mid lung zones bilaterally possibly infectious or inflammatory in nature as can be seen with COVID-19 pneumonia. This has progressed since prior examination. No pneumothorax or pleural effusion. Central airways are widely patent. Upper Abdomen: Cholecystectomy has been performed. No acute abnormality. Musculoskeletal: No acute bone abnormality. No lytic or blastic bone lesion. Review of the MIP images confirms the above findings. IMPRESSION: No pulmonary embolism. Mild cardiomegaly. Morphologic changes in keeping with pulmonary arterial hypertension. Progressive patchy bilateral mid lung zone ground-glass pulmonary infiltrate, possibly infectious or inflammatory in nature. Serologic correlation for COVID-19 pneumonia may be  helpful. Aortic Atherosclerosis (ICD10-I70.0). Electronically Signed   By: Fidela Salisbury M.D.   On: 02/08/2021 02:55   NM Pulmonary Perfusion  Result Date: 01/30/2021 CLINICAL DATA:  Shortness of breath EXAM: NUCLEAR MEDICINE PERFUSION LUNG SCAN TECHNIQUE: Perfusion images were obtained in multiple projections after intravenous injection of radiopharmaceutical. Ventilation scans intentionally deferred if perfusion scan and chest x-ray adequate for interpretation during COVID 19 epidemic. RADIOPHARMACEUTICALS:  4 mCi Tc-7mMAA IV COMPARISON:  01/28/2021 FINDINGS: There is adequate uptake of radioactive tracer throughout both lungs. No  focal defect to suggest pulmonary embolism is noted. Cardiac shadow is enlarged. IMPRESSION: No focal defect to suggest pulmonary embolism. Electronically Signed   By: MInez CatalinaM.D.   On: 01/30/2021 16:07   DG Chest Port 1 View  Result Date: 02/01/2021 CLINICAL DATA:  75year old female with history of shortness of breath. COPD. EXAM: PORTABLE CHEST 1 VIEW COMPARISON:  Chest x-ray 01/30/2021. FINDINGS: Lung volumes are normal. No consolidative airspace disease. No pleural effusions. Diffuse interstitial prominence and widespread peribronchial cuffing. No pneumothorax. No definite suspicious appearing pulmonary nodules or masses are noted. No evidence of pulmonary edema. Mild cardiomegaly. Upper mediastinal contours are within normal limits. Status post hemi median sternotomy for aortic valve replacement with what appears to be a stented bioprosthesis. IMPRESSION: 1. Diffuse interstitial prominence and peribronchial cuffing similar to prior studies suggestive of probable chronic bronchitis. 2. Cardiomegaly. Electronically Signed   By: DVinnie LangtonM.D.   On: 02/01/2021 10:49   DG Chest Port 1 View  Result Date: 01/30/2021 CLINICAL DATA:  Post VQ scan EXAM: PORTABLE CHEST 1 VIEW COMPARISON:  01/28/2021, 11/24/2020, 03/04/2018 FINDINGS: Post sternotomy changes with valve replacement. Cardiomegaly with mild vascular congestion. Diffuse interstitial opacities some of which is felt due to chronic disease. No pleural effusion or pneumothorax. IMPRESSION: 1. Cardiomegaly with slight central congestion and chronic interstitial opacity. Electronically Signed   By: KDonavan FoilM.D.   On: 01/30/2021 16:34   DG Chest Port 1 View  Result Date: 01/28/2021 CLINICAL DATA:  Difficulty breathing.  Shortness of breath. EXAM: PORTABLE CHEST 1 VIEW COMPARISON:  November 24, 2020 FINDINGS: Stable cardiomegaly. The hila and mediastinum are normal. No pneumothorax. No nodules or masses. No focal infiltrates. Increased  interstitial opacities in the lungs suggesting mild edema. IMPRESSION: Cardiomegaly and mild pulmonary edema. No other acute abnormalities. Electronically Signed   By: DDorise BullionIII M.D.   On: 01/28/2021 19:17   ECHOCARDIOGRAM COMPLETE  Result Date: 02/02/2021    ECHOCARDIOGRAM REPORT   Patient Name:   Megan DESJARDINDate of Exam: 02/02/2021 Medical Rec #:  [id number]    Height:       66.0 in Accession #:    [id number]   Weight:       203.0 lb Date of Birth:  [id number]     BSA:          2.012 m Patient Age:    89years      BP:           144/61 mmHg Patient Gender: F             HR:           74 bpm. Exam Location:  [facility] Procedure: 2D Echo, Color Doppler and Cardiac Doppler Indications:     Chest pain R07.9  History:         Patient has prior history of Echocardiogram examinations, most  recent 11/25/2020. Prior CABG, COPD; Risk Factors:Hypertension.  Sonographer:     Sherrie Sport Referring Phys:  [id number] SARA-MAIZ A THOMAS Diagnosing Phys: Serafina Royals MD  Sonographer Comments: Technically challenging study due to limited acoustic windows, suboptimal parasternal window and no apical window. Image acquisition challenging due to COPD. IMPRESSIONS  1. Left ventricular ejection fraction, by estimation, is 55 to 60%. The left ventricle has normal function. The left ventricle has no regional wall motion abnormalities. Left ventricular diastolic parameters were normal.  2. Right ventricular systolic function is normal. The right ventricular size is normal.  3. The mitral valve is normal in structure. Mild mitral valve regurgitation. Moderate mitral annular calcification.  4. The aortic valve is normal in structure. Aortic valve regurgitation is not visualized. FINDINGS  Left Ventricle: Left ventricular ejection fraction, by estimation, is 55 to 60%. The left ventricle has normal function. The left ventricle has no regional wall motion abnormalities. The left ventricular internal cavity size was  normal in size. There is  no left ventricular hypertrophy. Left ventricular diastolic parameters were normal. Right Ventricle: The right ventricular size is normal. No increase in right ventricular wall thickness. Right ventricular systolic function is normal. Left Atrium: Left atrial size was normal in size. Right Atrium: Right atrial size was normal in size. Pericardium: There is no evidence of pericardial effusion. Mitral Valve: The mitral valve is normal in structure. Moderate mitral annular calcification. Mild mitral valve regurgitation. Tricuspid Valve: The tricuspid valve is normal in structure. Tricuspid valve regurgitation is mild. Aortic Valve: The aortic valve is normal in structure. Aortic valve regurgitation is not visualized. Pulmonic Valve: The pulmonic valve was normal in structure. Pulmonic valve regurgitation is not visualized. Aorta: The aortic root and ascending aorta are structurally normal, with no evidence of dilitation. IAS/Shunts: No atrial level shunt detected by color flow Doppler.  LEFT VENTRICLE PLAX 2D LVIDd:         4.40 cm LVIDs:         3.10 cm LV PW:         1.50 cm LV IVS:        1.80 cm LVOT diam:     2.10 cm LVOT Area:     3.46 cm  LEFT ATRIUM         Index LA diam:    4.70 cm 2.34 cm/m                        PULMONIC VALVE AORTA                 PV Vmax:        0.84 m/s Ao Root diam: 3.10 cm PV Peak grad:   2.8 mmHg                       RVOT Peak grad: 5 mmHg   SHUNTS Systemic Diam: 2.10 cm Serafina Royals MD Electronically signed by Serafina Royals MD Signature Date/Time: 02/02/2021/4:41:25 PM    Final     EKG: Sinus rhythm with LVH.  Lateral ST changes.  Cath 11/14/20- Three vessel coronary artery disease.  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  # CAD s/p PCI to SVG and Lcx 11/14/20 The patient is status post CABG and AVR in 2015 and recently underwent stent placement to an SVG and left circumflex in June 2022.  She is not currently having any anginal symptoms.  Pain is decreased slightly from 10.9-8.9 without any overt signs of bleeding.  Given the risk of stent thrombosis in this interval after recent PCI, would recommend resuming dual antiplatelet therapy. -Would resume aspirin and Plavix unless overt signs of bleeding present himself.  If this occurs, it would be ideal to have the patient on monotherapy with aspirin. -Probably reasonable to discharge the patient on a p.o. 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  She is also being treated for COPD exacerbation.  Overall I feel that her symptoms are multifactorial, but it is difficult to eliminate the possibility of worsening aortic valve stenosis. -Agree with resumption of torsemide 20 mg daily -Continue losartan -Continue COPD treatment per the pulmonary team. -At discharge, the  patient needs close follow-up with Dr. KNehemiah Massedin 1 week.  # Anemia In the presence of aortic valve stenosis could consider hemolysis and/or GI bleeding from AVMs slowly contributing to her anemia. -We will consider sending hemolysis labs.  Signed: RAndrez GrimeMD 02/08/2021, 1:42 PM

## 2021-02-08 NOTE — ED Notes (Signed)
Report to Amy, RN. RM [id number]

## 2021-02-08 NOTE — Evaluation (Signed)
Physical Therapy Evaluation Patient Details Name: JOBANA GRUSZKA MRN: [id number] DOB: [birth year] Today's Date: 02/08/2021   History of Present Illness  75 y.o. female presenting to hospital with acute worsening of SOB, admitted with similar 8/27.  Pt admitted with COPD exacerbation complicated by anxiety.  PMH includes dCHF, COPD, DM, chronic respiratory failure on 3 L home O2, anemia, anxiety, HOH, OSA, NSTEMI, dizziness, AVR, and CABG.  Clinical Impression  Pt showed ability to do basic mobility tasks w/o assist and with good relatively safety and confidence.  She was clearly anxious t/o the entire session with increased anxiety during any request for increased activity.  She was able to initially ambulate relatively well with consistent cadence and no LOB, however she rapidly stated having DOE (sats in the high 90s t/o the session) that has apparently consistently been the case and requested to abruptly sit down and indicated flatly that she would/could not do any more activity today.      Follow Up Recommendations Home health PT (pt/husband not particularly interested)    Equipment Recommendations  None recommended by PT    Recommendations for Other Services       Precautions / Restrictions Precautions Precautions: Fall Restrictions Weight Bearing Restrictions: No      Mobility  Bed Mobility Overal bed mobility: Modified Independent             General bed mobility comments: able to transition to sitting w/o assist    Transfers Overall transfer level: Needs assistance Equipment used: None Transfers: Sit to/from Stand Sit to Stand: Supervision         General transfer comment: Able to rise to standing and maintain balance with only CGA, 2L O2 t/o the effort with some SOB starting with modest movement  Ambulation/Gait Ambulation/Gait assistance: Supervision Gait Distance (Feet): 50 Feet Assistive device: None       General Gait Details: Pt was able to initiate a  relatively consistent cadence w/o AD, on 2L O2.  She had mild, self-described waddle and was doing relatively well until quite abruptly she started having increased DOE (sats were at 98%) and seemly having an anxiety induded need to stop and sit and instantly asked for something to eat and drink.  Stairs            Wheelchair Mobility    Modified Rankin (Stroke Patients Only)       Balance Overall balance assessment: Needs assistance Sitting-balance support: No upper extremity supported;Feet supported Sitting balance-Leahy Scale: Good     Standing balance support: No upper extremity supported Standing balance-Leahy Scale: Fair Standing balance comment: Pt with no LOBs or overt unsteadiness but did have rapid onset anxiety with need to abruptly sit with rather modest effort                             Pertinent Vitals/Pain Pain Assessment: No/denies pain    Home Living Family/patient expects to be discharged to:: Private residence Living Arrangements: Spouse/significant other Available Help at Discharge: Family;Available PRN/intermittently;Other (Comment) (husband is mobile with his prosthetic) Type of Home: House Home Access: Stairs to enter Entrance Stairs-Rails: Left Entrance Stairs-Number of Steps: 5 Home Layout: One level Home Equipment: Shower seat;Hand held shower head;Grab bars - tub/shower      Prior Function Level of Independence: Independent   Gait / Transfers Assistance Needed: Independent without AD for functional mobility of short household distances (<124f). Pt reports no recent falls, but states  that just walking to/from bathroom exhausts her           Hand Dominance        Extremity/Trunk Assessment   Upper Extremity Assessment Upper Extremity Assessment: Overall WFL for tasks assessed;Generalized weakness    Lower Extremity Assessment Lower Extremity Assessment: Overall WFL for tasks assessed;Generalized weakness        Communication   Communication: HOH  Cognition Arousal/Alertness: Awake/alert Behavior During Therapy: Anxious Overall Cognitive Status: Within Functional Limits for tasks assessed                                        General Comments General comments (skin integrity, edema, etc.): Pt apparently has been able to do only basic in-home ambulation for at least the last 6 months, essentially never out of the home apart from MD appointments    Exercises     Assessment/Plan    PT Assessment Patient needs continued PT services  PT Problem List Decreased strength;Decreased activity tolerance;Decreased balance;Decreased mobility;Decreased knowledge of use of DME;Cardiopulmonary status limiting activity       PT Treatment Interventions DME instruction;Gait training;Stair training;Functional mobility training;Therapeutic activities;Therapeutic exercise;Balance training;Patient/family education    PT Goals (Current goals can be found in the Care Plan section)  Acute Rehab PT Goals Patient Stated Goal: to improve breathing PT Goal Formulation: With patient Time For Goal Achievement: 02/22/21 Potential to Achieve Goals: Fair    Frequency Min 2X/week   Barriers to discharge        Co-evaluation               AM-PAC PT "6 Clicks" Mobility  Outcome Measure Help needed turning from your back to your side while in a flat bed without using bedrails?: None Help needed moving from lying on your back to sitting on the side of a flat bed without using bedrails?: None Help needed moving to and from a bed to a chair (including a wheelchair)?: A Little Help needed standing up from a chair using your arms (e.g., wheelchair or bedside chair)?: A Little Help needed to walk in hospital room?: A Little Help needed climbing 3-5 steps with a railing? : A Little 6 Click Score: 20    End of Session Equipment Utilized During Treatment: Gait belt;Oxygen Activity Tolerance: Other  (comment) (limited due to SOB/DOE/anxiety) Patient left: with chair alarm set;with call bell/phone within reach;with family/visitor present Nurse Communication: Mobility status PT Visit Diagnosis: Other abnormalities of gait and mobility (R26.89);Muscle weakness (generalized) (M62.81);Unsteadiness on feet (R26.81)    Time: [id number] PT Time Calculation (min) (ACUTE ONLY): 25 min   Charges:   PT Evaluation $PT Eval Low Complexity: 1 Low PT Treatments $Gait Training: 8-22 mins        Kreg Shropshire, DPT 02/08/2021, 5:14 PM

## 2021-02-08 NOTE — ED Notes (Signed)
Pt ready for CT

## 2021-02-08 NOTE — Progress Notes (Signed)
Patient ID: [name], female   DOB: February 21, 1946, 75 y.o.   MRN: [id number] Patient admitted early this morning for worsening chest shortness of breath and chest pain with concern for suspected GI bleeding.  Cardiology and GI have been consulted.  Patient seen and examined at bedside and plan of care discussed with her and husband at bedside.  I have reviewed patient's medical records including this morning's H&P, current vitals, labs and medications myself.  I have also consulted pulmonary because of worsening shortness of breath and CTA findings.  DC aspirin/Plavix still GI evaluation.  Repeat a.m. labs.

## 2021-02-08 NOTE — ED Provider Notes (Signed)
[facility] Emergency Department Provider Note  ____________________________________________   Event Date/Time   First MD Initiated Contact with Patient 02/08/21 0001     (approximate)  I have reviewed the triage vital signs and the nursing notes.   HISTORY  Chief Complaint Shortness of Breath    HPI [name] is a 75 y.o. female patient here with history of diastolic CHF, COPD, CAD, hypertension, diabetes, hyperlipidemia who presents to the emergency department with months of shortness of breath that progressively worsened today.  Also reports diffuse chest tightness without radiation.  States she has had nonproductive cough but no fevers or chills.  No lower extremity swelling or pain.  Has had some associated nausea, diaphoresis and dizziness.  Unable to tell me if there are any aggravating or alleviating factors.  Was recently admitted to the hospital for similar symptoms.  At that time had a negative pulmonary perfusion scan.  No known history of PE, DVT.  Has history of chronic anemia.  Denies bloody stools, melena.        Past Medical History:  Diagnosis Date   1st degree AV block    ACE-inhibitor cough    Allergic rhinitis    Anemia    iron deficiency anemia   Anxiety    Aortic ectasia (HCC)    a. CT abd in 12/2016 incidentally noted aortic atherosclerosis and infrarenal abdominal aortic ectasia measuring as large as 2.7 cm with recommendation to repeat US in 2023.   Arthritis    Asthma    Cataract    Chronic depression    Chronic diastolic CHF (congestive heart failure) (HCC)    Chronic headache    COPD (chronic obstructive pulmonary disease) (HCC)    Coronary artery disease    a. DES to RCA and mid Cx 2009. b. CABG and bioprosthetic AVR May 2015. c. cutting balloon to prox Cx in 05/2016   Diabetes mellitus    type 2   Diverticulitis of colon    Essential hypertension    GERD (gastroesophageal reflux disease)    Hearing loss     History of blood transfusion 2013   History of prosthetic aortic valve replacement    HOH (hard of hearing)    Hypercholesterolemia    intolerance of statins and niaspan   IDA (iron deficiency anemia) 02/03/2019   Mobitz type 1 second degree AV block    OSA (obstructive sleep apnea)    mild, intolerant of cpap   PAD (peripheral artery disease) ([place])    a. atherosclerosis by CT abd 12/2016 in [place].   PONV (postoperative nausea and vomiting)    Statin intolerance    Thyroid disease     Patient Active Problem List   Diagnosis Date Noted   Acute on chronic diastolic (congestive) heart failure (HCC) 11/24/2020   SOB (shortness of breath) 11/24/2020   Acute hyponatremia 11/24/2020   Prolonged QT interval 11/24/2020   Angina pectoris ([place]) 11/03/2020   CHF exacerbation ([place]) 09/07/2020   Elevated troponin 09/07/2020   Chest pain 08/09/2020   Acute on chronic diastolic CHF (congestive heart failure) ([place]) 08/08/2020   Anemia due to stage 3a chronic kidney disease ([place]) 05/21/2019   IDA (iron deficiency anemia) 02/03/2019   COPD exacerbation ([place]) 12/28/2017   Depression, major, single episode, complete remission ([place]) 04/11/2017   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   Aortic ectasia, abdominal ([place]) 01/01/2017   GAD (generalized anxiety disorder) 09/26/2016   Acute posthemorrhagic anemia 08/24/2016   Acute  respiratory failure with hypoxia ([place]) [id number]   Acute diastolic CHF (congestive heart failure) ([place]) 08/24/2016   Hyponatremia 08/24/2016   Leukocytosis 08/24/2016   Fever 08/24/2016   Diarrhea 08/24/2016   Generalized weakness 08/24/2016   Acute diverticulitis 08/16/2016   Gastrointestinal hemorrhage    Ataxia 08/09/2016   Dizziness 08/09/2016   Staggering gait 08/09/2016   NSTEMI (non-ST elevated myocardial infarction) [facility])    AVB (atrioventricular block)    Atypical chest pain    Unstable angina ([place]) 05/25/2016   Palpitations 05/25/2016   PUD - recent Rx for  H.Pylori 05/25/2016   Stenosis of coronary artery stent    Recurrent major depressive disorder, in partial remission ([place]) 07/29/2015   Sialadenitis 04/25/2015   Viral URI 03/24/2015   Hypoglycemia 08/25/2014   Nausea without vomiting 08/25/2014   Dyslipidemia-statin ontol    Long-term insulin use ([place]) 03/31/2014   Aortic valve disorder 11/11/2013   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 10/12/2013   Coronary Artery Disease s/p CABG 10/2013 10/10/2013   Other malaise and fatigue 09/11/2013   Alopecia 03/06/2013   Insomnia 02/06/2013   Gastroesophageal reflux disease without esophagitis 01/19/2013   Dysphagia, unspecified(787.20) 01/19/2013   Dyspnea 11/17/2012   Weakness 11/17/2012   Abnormal MRI of head 09/05/2012   Depression with anxiety 03/21/2012   Extrinsic asthma 11/12/2011   Anemia 06/07/2011   Chronic obstructive pulmonary disease ([place]) 01/25/2010   Acquired hypothyroidism 08/10/2009   MICROSCOPIC HEMATURIA 05/07/2009   GOITER 03/02/2009   Essential hypertension 01/06/2009   MEMORY LOSS 06/23/2008   Obstructive sleep apnea 12/25/2007   DM type 2 (diabetes mellitus, type 2) ([place]) 09/09/2006   ALLERGIC RHINITIS 09/09/2006   DIVERTICULOSIS, COLON 09/09/2006    Past Surgical History:  Procedure Laterality Date   ABDOMINAL HYSTERECTOMY     ABDOMINAL HYSTERECTOMY W/ PARTIAL VAGINACTOMY     AORTIC VALVE REPLACEMENT N/A 10/12/2013   Procedure: AORTIC VALVE REPLACEMENT (AVR);  Surgeon: [name], MD;  Location: [facility];  Service: Open Heart Surgery;  Laterality: N/A;   APPENDECTOMY  1964   BARTHOLIN GLAND CYST EXCISION     BLADDER SUSPENSION     BREAST BIOPSY Bilateral 09/11/2000   neg   BREAST BIOPSY Left 07/24/2010   neg   BREAST CYST EXCISION  1988   bilateral nonmalignant tumors, x3   CARDIAC CATHETERIZATION     CARDIAC CATHETERIZATION N/A 05/25/2016   Procedure: Coronary Balloon Angioplasty;  Surgeon: Leonie Man, MD;  Location: [facility];  Service: Cardiovascular;   Laterality: N/A;   CARDIAC CATHETERIZATION N/A 05/25/2016   Procedure: Coronary/Graft Angiography;  Surgeon: Leonie Man, MD;  Location: [place] [facility];  Service: Cardiovascular;  Laterality: N/A;   CATARACT EXTRACTION W/ INTRAOCULAR LENS  IMPLANT, BILATERAL     CHOLECYSTECTOMY  2001   COLECTOMY     lap sigmoid   COLONOSCOPY  2014   polyps found, 2 clamped off.   CORONARY ANGIOPLASTY  10/29/2007   Prox RCA & Mid Cx.   CORONARY ARTERY BYPASS GRAFT N/A 10/12/2013   Procedure: CORONARY ARTERY BYPASS GRAFT TIMES TWO;  Surgeon: [name], MD;  Location: [facility];  Service: Open Heart Surgery;  Laterality: N/A;   CORONARY/GRAFT ANGIOGRAPHY N/A 09/20/2017   Procedure: CORONARY/GRAFT ANGIOGRAPHY;  Surgeon: Sherren Mocha, MD;  Location: [facility];  Service: Cardiovascular;  Laterality: N/A;   LEFT HEART CATHETERIZATION WITH CORONARY ANGIOGRAM N/A 10/09/2013   Procedure: LEFT HEART CATHETERIZATION WITH CORONARY ANGIOGRAM;  Surgeon: Annita Brod  Angelena Form, MD;  Location: [facility];  Service: Cardiovascular;  Laterality: N/A;   RIGHT/LEFT HEART CATH AND CORONARY ANGIOGRAPHY N/A 11/03/2020   Procedure: RIGHT/LEFT HEART CATH AND CORONARY ANGIOGRAPHY;  Surgeon: Corey Skains, MD;  Location: [facility];  Service: Cardiovascular;  Laterality: N/A;   STERNAL WIRES REMOVAL N/A 04/13/2014   Procedure: STERNAL WIRES REMOVAL;  Surgeon: [name], MD;  Location: [facility];  Service: Thoracic;  Laterality: N/A;   TEE WITHOUT CARDIOVERSION N/A 11/03/2020   Procedure: TRANSESOPHAGEAL ECHOCARDIOGRAM (TEE);  Surgeon: Corey Skains, MD;  Location: [facility];  Service: Cardiovascular;  Laterality: N/A;   THYROIDECTOMY     TONSILLECTOMY     TUBAL LIGATION     VAGINAL DELIVERY     3   VISCERAL ARTERY INTERVENTION N/A 08/16/2016   Procedure: Visceral Artery Intervention;  Surgeon: Algernon Huxley, MD;  Location: [place] [facility];  Service: Cardiovascular;  Laterality: N/A;    Prior to  Admission medications   Medication Sig Start Date End Date Taking? Authorizing Provider  acetaminophen (TYLENOL) 500 MG tablet Take 500-1,000 mg by mouth every 6 (six) hours as needed (pain).    [provider]  albuterol (PROVENTIL) (2.5 MG/3ML) 0.083% nebulizer solution Take 2.5 mg by nebulization every 4 (four) hours as needed for shortness of breath or wheezing. 06/07/20   [provider]  ANORO ELLIPTA 62.5-25 MCG/INH AEPB Inhale 1 puff into the lungs daily. 10/11/20   [provider]  aspirin EC 81 MG EC tablet Take 1 tablet (81 mg total) by mouth daily. Swallow whole. 08/10/20   Samuella Cota, MD  Calcium-Vitamin D (CALTRATE 600 PLUS-VIT D PO) Take 1 tablet by mouth 2 (two) times daily.    [provider]  clopidogrel (PLAVIX) 75 MG tablet Take 75 mg by mouth daily. 11/15/20   [provider]  DULoxetine (CYMBALTA) 20 MG capsule Take 1 capsule (20 mg total) by mouth in the morning. 11/26/20   Jennye Boroughs, MD  FLUoxetine (PROZAC) 40 MG capsule Take 40 mg by mouth 2 (two) times daily. 08/23/20   [provider]  hydrOXYzine (ATARAX/VISTARIL) 25 MG tablet Take 1 tablet (25 mg total) by mouth 3 (three) times daily. 02/02/21 03/04/21  Mariel Aloe, MD  insulin glargine (SEMGLEE) 100 UNIT/ML injection Inject 100 Units into the skin at bedtime.    [provider]  insulin lispro (HUMALOG) 100 UNIT/ML injection Inject 0-54 Units into the skin 4 (four) times daily -  before meals and at bedtime.    [provider]  insulin NPH-regular Human (NOVOLIN 70/30) (70-30) 100 UNIT/ML injection Inject 60 Units into the skin 2 (two) times daily with a meal. Inject 60u under the skin every morning at breakfast and inject 60u under the skin every evening with supper    [provider]  isosorbide mononitrate (IMDUR) 30 MG 24 hr tablet Take 1 tablet (30 mg total) by mouth daily. 11/03/20 11/03/21  Corey Skains, MD  levothyroxine  (SYNTHROID, LEVOTHROID) 125 MCG tablet Take 125 mcg by mouth daily before breakfast. 04/03/17   [provider]  losartan (COZAAR) 25 MG tablet Take 1 tablet (25 mg total) by mouth in the morning. 11/26/20   Jennye Boroughs, MD  nystatin (MYCOSTATIN) 100000 UNIT/ML suspension Take by mouth. 11/21/20   [provider]  pantoprazole (PROTONIX) 40 MG tablet Take 40 mg by mouth in the morning.    [provider]  potassium chloride (KLOR-CON)  10 MEQ tablet Take 1 tablet (10 mEq total) by mouth daily. 11/26/20   Jennye Boroughs, MD  promethazine (PHENERGAN) 25 MG tablet Take 25 mg by mouth every 6 (six) hours as needed for nausea or vomiting.    [provider]  torsemide (DEMADEX) 20 MG tablet Take 20 mg by mouth daily. 12/08/20   [provider]  traZODone (DESYREL) 100 MG tablet Take 200 mg by mouth at bedtime.    [provider]    Allergies Amitriptyline, Benadryl [diphenhydramine], Demerol [meperidine], Gabapentin, Meperidine hcl, Mirtazapine, Olanzapine, Voltaren [diclofenac sodium], Zetia [ezetimibe], Ativan [lorazepam], Atorvastatin, Budesonide-formoterol fumarate, Bupropion hcl, Caffeine, Codeine sulfate, Lisinopril, Metformin, Mometasone furoate, Morphine sulfate, Other, Oxycodone-acetaminophen, Pioglitazone, Propoxyphene n-acetaminophen, Rosuvastatin, Shellfish allergy, Suvorexant, Ticagrelor, Tramadol, Trazodone and nefazodone, Venlafaxine, Zolpidem tartrate, and Latex  Family History  Problem Relation Age of Onset   Breast cancer Mother 71   Hypertension Father    Mesothelioma Father    Asthma Father    Stroke Paternal Grandfather    Heart disease Other    Breast cancer Maternal Aunt    Breast cancer Paternal Aunt     Social History Social History   Tobacco Use   Smoking status: Former    Packs/day: 0.50    Years: 30.00    Pack years: 15.00    Types: Cigarettes    Quit date: 10/02/2013    Years since quitting: 7.3   Smokeless  tobacco: Never  Vaping Use   Vaping Use: Never used  Substance Use Topics   Alcohol use: No   Drug use: No    Review of Systems Constitutional: No fever. Eyes: No visual changes. ENT: No sore throat. Cardiovascular: +chest pain. Respiratory: +shortness of breath. Gastrointestinal: No vomiting, diarrhea. Genitourinary: Negative for dysuria. Musculoskeletal: Negative for back pain. Skin: Negative for rash. Neurological: Negative for focal weakness or numbness.  ____________________________________________   PHYSICAL EXAM:  VITAL SIGNS: ED Triage Vitals  Enc Vitals Group     BP 02/07/21 2220 139/70     Pulse Rate 02/07/21 2220 83     Resp 02/07/21 2220 (!) 30     Temp 02/07/21 2220 99 F (37.2 C)     Temp Source 02/07/21 2220 Oral     SpO2 02/07/21 2220 97 %     Weight 02/07/21 2223 200 lb (90.7 kg)     Height 02/07/21 2223 '5\' 6"'$  (1.676 m)     Head Circumference --      Peak Flow --      Pain Score 02/07/21 2223 4     Pain Loc --      Pain Edu? --      Excl. in [place]? --    CONSTITUTIONAL: Alert and oriented and responds appropriately to questions.  Elderly, obese, appears very anxious HEAD: Normocephalic EYES: Conjunctivae clear, pupils appear equal, EOM appear intact ENT: normal nose; moist mucous membranes NECK: Supple, normal ROM, no JVD CARD: RRR; S1 and S2 appreciated; no murmurs, no clicks, no rubs, no gallops RESP: Patient is tachypneic.  She has diffuse expiratory wheezes.  Diminished at bases bilaterally.  No rhonchi or rales.  No hypoxia on room air on her normal 3 L nasal cannula.  She is speaking full sentences.  She appears very anxious but not in respiratory distress. ABD/GI: Normal bowel sounds; non-distended; soft, non-tender, no rebound, no guarding, no peritoneal signs, no hepatosplenomegaly RECTAL:  Normal rectal tone, no gross blood or melena, stool is normal brown color, guaiac POSITIVE, no hemorrhoids  appreciated, nontender rectal exam, no fecal  impaction. Chaperone present. BACK: The back appears normal EXT: Normal ROM in all joints; no deformity noted, no edema; no cyanosis, no calf tenderness or calf swelling SKIN: Normal color for age and race; warm; no rash on exposed skin NEURO: Moves all extremities equally PSYCH: The patient's mood and manner are appropriate.  ____________________________________________   LABS (all labs ordered are listed, but only abnormal results are displayed)  Labs Reviewed  CBC - Abnormal; Notable for the following components:      Result Value   RBC 3.07 (*)    Hemoglobin 8.9 (*)    HCT 26.6 (*)    All other components within normal limits  BASIC METABOLIC PANEL - Abnormal; Notable for the following components:   Sodium 131 (*)    Potassium 5.3 (*)    Glucose, Bld 233 (*)    Creatinine, Ser 1.04 (*)    Calcium 8.0 (*)    GFR, Estimated 56 (*)    All other components within normal limits  BRAIN NATRIURETIC PEPTIDE - Abnormal; Notable for the following components:   B Natriuretic Peptide 423.0 (*)    All other components within normal limits  TROPONIN I (HIGH SENSITIVITY) - Abnormal; Notable for the following components:   Troponin I (High Sensitivity) 100 (*)    All other components within normal limits  RESP PANEL BY RT-PCR (FLU A&B, COVID) [id number]  TROPONIN I (HIGH SENSITIVITY)   ____________________________________________  EKG   EKG Interpretation  Date/Time:  Tuesday February 07 2021 22:28:30 EDT Ventricular Rate:  82 PR Interval:  216 QRS Duration: 98 QT Interval:  404 QTC Calculation: 472 R Axis:   18 Text Interpretation: Sinus rhythm with 1st degree A-V block Minimal voltage criteria for LVH, may be normal variant ( Cornell product ) ST & T wave abnormality, consider lateral ischemia Prolonged QT Abnormal ECG No significant change since last tracing Confirmed by Pryor Curia [phone]) on 02/08/2021 12:38:32 AM         ____________________________________________  RADIOLOGY Jessie Foot Xena Propst, personally viewed and evaluated these images (plain radiographs) as part of my medical decision making, as well as reviewing the written report by the radiologist.  ED MD interpretation: Chest x-ray clear.  Official radiology report(s): DG Chest 2 View  Result Date: 02/07/2021 CLINICAL DATA:  Shortness of breath.  Chest pain. EXAM: CHEST - 2 VIEW COMPARISON:  Most recent radiograph and CT 02/01/2021. FINDINGS: Stable prominent cardiac contours post aortic valve replacement and median sternotomy. Unchanged mediastinal contours. Chronic coarse lung markings. No confluent airspace disease, pleural effusion, or pneumothorax. Ground-glass airspace disease on prior CT are not well seen by radiograph. IMPRESSION: No acute chest finding.  Chronic coarse lung markings. Electronically Signed   By: Keith Rake M.D.   On: 02/07/2021 22:47    ____________________________________________   PROCEDURES  Procedure(s) performed (including Critical Care):  Procedures    ____________________________________________   INITIAL IMPRESSION / ASSESSMENT AND PLAN / ED COURSE  As part of my medical decision making, I reviewed the following data within the [place] notes reviewed and incorporated, Labs reviewed , EKG interpreted , Old EKG reviewed, Old chart reviewed, Radiograph reviewed , Discussed with admitting physician , and Notes from prior ED visits         Patient here with worsening shortness of breath and chest tightness.  EKG shows no new ischemic change.  Troponin elevated at 100.  It appears that she always has slightly  elevated troponins but this is slightly higher than recent.  She has had a slight drop in her hemoglobin.  On rectal exam she is guaiac positive but has normal-appearing brown stool.  We will hold on aspirin and heparin at this time.  Patient may also be having a COPD  versus CHF exacerbation.  Will give albuterol, Solu-Medrol and Lasix.  Her BNP is elevated in the 400s today.  Patient will need admission.  Will discuss with hospitalist.  ED PROGRESS    12:37 AM  Discussed patient's case with hospitalist, Dr. Sidney Ace  I have recommended admission and patient (and family if present) agree with this plan. Admitting physician will place admission orders.   He recommends obtaining a CTA of her chest to rule out PE.  This test has been ordered.  I reviewed all nursing notes, vitals, pertinent previous records and reviewed/interpreted all EKGs, lab and urine results, imaging (as available).  ____________________________________________   FINAL CLINICAL IMPRESSION(S) / ED DIAGNOSES  Final diagnoses:  Shortness of breath  COPD exacerbation (HCC)  NSTEMI (non-ST elevated myocardial infarction) ([place])  Anemia, unspecified type     ED Discharge Orders     None       *Please note:  [name] was evaluated in Emergency Department on 02/08/2021 for the symptoms described in the history of present illness. She was evaluated in the context of the global COVID-19 pandemic, which necessitated consideration that the patient might be at risk for infection with the SARS-CoV-2 virus that causes COVID-19. Institutional protocols and algorithms that pertain to the evaluation of patients at risk for COVID-19 are in a state of rapid change based on information released by regulatory bodies including the CDC and federal and state organizations. These policies and algorithms were followed during the patient's care in the ED.  Some ED evaluations and interventions may be delayed as a result of limited staffing during and the pandemic.*   Note:  This document was prepared using Dragon voice recognition software and may include unintentional dictation errors.    Symon Norwood, Delice Bison, DO 02/08/21 [phone]

## 2021-02-08 NOTE — Progress Notes (Signed)
Updated husband via telephone of patient room assignment

## 2021-02-08 NOTE — ED Notes (Signed)
CT call for pt

## 2021-02-08 NOTE — ED Notes (Signed)
Pt is excessive anxious, but when staff leaves room. Pt acts with no distress.

## 2021-02-08 NOTE — Progress Notes (Signed)
Inpatient Diabetes Program Recommendations  AACE/ADA: New Consensus Statement on Inpatient Glycemic Control (2015)  Target Ranges:  Prepandial:   less than 140 mg/dL      Peak postprandial:   less than 180 mg/dL (1-2 hours)      Critically ill patients:  140 - 180 mg/dL   Lab Results  Component Value Date   GLUCAP 400 (H) 02/08/2021   HGBA1C 8.1 (H) 02/08/2021    Review of Glycemic Control Results for [name], [name] (MRN [id number]) as of 02/08/2021 11:16  Ref. Range 02/08/2021 07:43 02/08/2021 08:20  Glucose-Capillary Latest Ref Range: 70 - 99 mg/dL 416 (H) 400 (H)   Diabetes history: DM2 Outpatient Diabetes medications: Novolin 70/30 60 units bid, Humalog 0-54 units tid meal coverage Current orders for Inpatient glycemic control: Novolog 70/30 60 units bid, Novolog 0-15 units qid, Solumedrol 40 units bid then Prednisone 40 mg qd  Inpatient Diabetes Program Recommendations:   Patient is currently NPO.  -D/C Novolog 70/30 -Add basal Levemir 40 units bid -When eating, add Novolog 10 units tid meal coverage if eats 50% -Change Novolog correction to q 4 hrs. While NPO, then tid + hs 0-5 units Secure chat sent to Dr. Starla Link.  Thank you, [name]. [name], RN, MSN, CDE  Diabetes Coordinator Inpatient Glycemic Control Team Team Pager [phone] (8am-5pm) 02/08/2021 11:23 AM

## 2021-02-08 NOTE — ED Notes (Signed)
Pt back from CT with c/o pain at IV site. IV found to be infiltrated. IV removed.

## 2021-02-08 NOTE — Consult Note (Signed)
[facility] GI Inpatient Consult Note   [name], M.D.  Reason for Consult: "GI Bleed". Anemia, hemoccult positive stool.   Attending Requesting Consult: Eugenie Norrie, M.D.  Outpatient Primary Physician: Emily Filbert, M.D.  History of Present Illness: [name] is a 75 y.o. female with history of ASCVD s/p CABG (2015), Severe COPD, Uncontrolled Type II DM, who presented to the ED late yesterday evening for SOB and pleuritic pain. Diagnosed with COPD exacerbation, possible acute coronary syndrome and admitted to the hospital. Patient noted to have anemia with Hgb of 8.9 and hemoccult positive stool. Patient denied overt symptoms of upper and lower gastrointestinal bleeding such as hemetemesis, melena and hematochezia. Patient has a known history of iron deficiency anemia and is followed by Dr. Tasia Catchings at [facility]. Patient appears to be a poor historian though she is alert and oriented x 3. She has record of previous colonoscopy in June 2014 with findings of diverticulosis and two colon polyps removed. She had an episode of postpolypectomy bleeding and had repeat colonoscopy with hemostasis performed on bleeding post polyp site in the descending colon. Currently, patient denies chest tightness and SOB is markedly improved. No chest pain. From a GI standpoint, she is eating a carb modified diet without difficulty. She reports a normal "Brown" bowel movement today. Denies abdominal pain, nausea or vomiting.      Past Medical History:  Past Medical History:  Diagnosis Date   1st degree AV block    ACE-inhibitor cough    Allergic rhinitis    Anemia    iron deficiency anemia   Anxiety    Aortic ectasia (HCC)    a. CT abd in 12/2016 incidentally noted aortic atherosclerosis and infrarenal abdominal aortic ectasia measuring as large as 2.7 cm with recommendation to repeat US in 2023.   Arthritis    Asthma    Cataract    Chronic depression    Chronic  diastolic CHF (congestive heart failure) (HCC)    Chronic headache    COPD (chronic obstructive pulmonary disease) (HCC)    Coronary artery disease    a. DES to RCA and mid Cx 2009. b. CABG and bioprosthetic AVR May 2015. c. cutting balloon to prox Cx in 05/2016   Diabetes mellitus    type 2   Diverticulitis of colon    Essential hypertension    GERD (gastroesophageal reflux disease)    Hearing loss    History of blood transfusion 2013   History of prosthetic aortic valve replacement    HOH (hard of hearing)    Hypercholesterolemia    intolerance of statins and niaspan   IDA (iron deficiency anemia) 02/03/2019   Mobitz type 1 second degree AV block    OSA (obstructive sleep apnea)    mild, intolerant of cpap   PAD (peripheral artery disease) ([place])    a. atherosclerosis by CT abd 12/2016 in [place].   PONV (postoperative nausea and vomiting)    Statin intolerance    Thyroid disease     Problem List: Patient Active Problem List   Diagnosis Date Noted   Acute on chronic diastolic (congestive) heart failure (HCC) 11/24/2020   SOB (shortness of breath) 11/24/2020   Acute hyponatremia 11/24/2020   Prolonged QT interval 11/24/2020   Angina pectoris ([place]) 11/03/2020   CHF exacerbation ([place]) 09/07/2020   Elevated troponin 09/07/2020   Chest pain 08/09/2020   Acute on chronic diastolic CHF (congestive heart failure) ([place]) 08/08/2020   Anemia due to  stage 3a chronic kidney disease ([place]) 05/21/2019   IDA (iron deficiency anemia) 02/03/2019   COPD exacerbation ([place]) 12/28/2017   Depression, major, single episode, complete remission ([place]) 04/11/2017   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   Aortic ectasia, abdominal (HCC) 01/01/2017   GAD (generalized anxiety disorder) 09/26/2016   Acute posthemorrhagic anemia 08/24/2016   Acute respiratory failure with hypoxia ([place]) [id number]   Acute diastolic CHF (congestive heart failure) ([place]) 08/24/2016   Hyponatremia 08/24/2016   Leukocytosis  08/24/2016   Fever 08/24/2016   Diarrhea 08/24/2016   Generalized weakness 08/24/2016   Acute diverticulitis 08/16/2016   Gastrointestinal hemorrhage    Ataxia 08/09/2016   Dizziness 08/09/2016   Staggering gait 08/09/2016   NSTEMI (non-ST elevated myocardial infarction) [facility])    AVB (atrioventricular block)    Atypical chest pain    Unstable angina ([place]) 05/25/2016   Palpitations 05/25/2016   PUD - recent Rx for H.Pylori 05/25/2016   Stenosis of coronary artery stent    Recurrent major depressive disorder, in partial remission ([place]) 07/29/2015   Sialadenitis 04/25/2015   Viral URI 03/24/2015   Hypoglycemia 08/25/2014   Nausea without vomiting 08/25/2014   Dyslipidemia-statin ontol    Long-term insulin use ([place]) 03/31/2014   Aortic valve disorder 11/11/2013   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 10/12/2013   Coronary Artery Disease s/p CABG 10/2013 10/10/2013   Other malaise and fatigue 09/11/2013   Alopecia 03/06/2013   Insomnia 02/06/2013   Gastroesophageal reflux disease without esophagitis 01/19/2013   Dysphagia, unspecified(787.20) 01/19/2013   Dyspnea 11/17/2012   Weakness 11/17/2012   Abnormal MRI of head 09/05/2012   Depression with anxiety 03/21/2012   Extrinsic asthma 11/12/2011   Anemia 06/07/2011   Chronic obstructive pulmonary disease ([place]) 01/25/2010   Acquired hypothyroidism 08/10/2009   MICROSCOPIC HEMATURIA 05/07/2009   GOITER 03/02/2009   Essential hypertension 01/06/2009   MEMORY LOSS 06/23/2008   Obstructive sleep apnea 12/25/2007   DM type 2 (diabetes mellitus, type 2) ([place]) 09/09/2006   ALLERGIC RHINITIS 09/09/2006   DIVERTICULOSIS, COLON 09/09/2006    Past Surgical History: Past Surgical History:  Procedure Laterality Date   ABDOMINAL HYSTERECTOMY     ABDOMINAL HYSTERECTOMY W/ PARTIAL VAGINACTOMY     AORTIC VALVE REPLACEMENT N/A 10/12/2013   Procedure: AORTIC VALVE REPLACEMENT (AVR);  Surgeon: [name], MD;  Location: [place];  Service: Open Heart  Surgery;  Laterality: N/A;   APPENDECTOMY  1964   BARTHOLIN GLAND CYST EXCISION     BLADDER SUSPENSION     BREAST BIOPSY Bilateral 09/11/2000   neg   BREAST BIOPSY Left 07/24/2010   neg   BREAST CYST EXCISION  1988   bilateral nonmalignant tumors, x3   CARDIAC CATHETERIZATION     CARDIAC CATHETERIZATION N/A 05/25/2016   Procedure: Coronary Balloon Angioplasty;  Surgeon: Leonie Man, MD;  Location: [place] [facility];  Service: Cardiovascular;  Laterality: N/A;   CARDIAC CATHETERIZATION N/A 05/25/2016   Procedure: Coronary/Graft Angiography;  Surgeon: Leonie Man, MD;  Location: [place] [facility];  Service: Cardiovascular;  Laterality: N/A;   CATARACT EXTRACTION W/ INTRAOCULAR LENS  IMPLANT, BILATERAL     CHOLECYSTECTOMY  2001   COLECTOMY     lap sigmoid   COLONOSCOPY  2014   polyps found, 2 clamped off.   CORONARY ANGIOPLASTY  10/29/2007   Prox RCA & Mid Cx.   CORONARY ARTERY BYPASS GRAFT N/A 10/12/2013   Procedure: CORONARY ARTERY BYPASS GRAFT TIMES TWO;  Surgeon: [name], MD;  Location: [facility];  Service: Open Heart Surgery;  Laterality: N/A;   CORONARY/GRAFT ANGIOGRAPHY N/A 09/20/2017   Procedure: CORONARY/GRAFT ANGIOGRAPHY;  Surgeon: Sherren Mocha, MD;  Location: [place] [facility];  Service: Cardiovascular;  Laterality: N/A;   LEFT HEART CATHETERIZATION WITH CORONARY ANGIOGRAM N/A 10/09/2013   Procedure: LEFT HEART CATHETERIZATION WITH CORONARY ANGIOGRAM;  Surgeon: [name], MD;  Location: [facility] CATH LAB;  Service: Cardiovascular;  Laterality: N/A;   RIGHT/LEFT HEART CATH AND CORONARY ANGIOGRAPHY N/A 11/03/2020   Procedure: RIGHT/LEFT HEART CATH AND CORONARY ANGIOGRAPHY;  Surgeon: Corey Skains, MD;  Location: [place] [facility];  Service: Cardiovascular;  Laterality: N/A;   STERNAL WIRES REMOVAL N/A 04/13/2014   Procedure: STERNAL WIRES REMOVAL;  Surgeon: [name], MD;  Location: [place];  Service: Thoracic;  Laterality: N/A;   TEE WITHOUT  CARDIOVERSION N/A 11/03/2020   Procedure: TRANSESOPHAGEAL ECHOCARDIOGRAM (TEE);  Surgeon: Corey Skains, MD;  Location: [facility];  Service: Cardiovascular;  Laterality: N/A;   THYROIDECTOMY     TONSILLECTOMY     TUBAL LIGATION     VAGINAL DELIVERY     3   VISCERAL ARTERY INTERVENTION N/A 08/16/2016   Procedure: Visceral Artery Intervention;  Surgeon: Algernon Huxley, MD;  Location: [place] [facility];  Service: Cardiovascular;  Laterality: N/A;    Allergies: Allergies  Allergen Reactions   Amitriptyline Other (See Comments)    Unknown reaction   Benadryl [Diphenhydramine] Shortness Of Breath   Demerol [Meperidine] Other (See Comments)    Unknown reaction   Gabapentin Other (See Comments)    Unknown reaction   Meperidine Hcl Other (See Comments)    Unknown reaction   Mirtazapine Other (See Comments)    Unknown reaction   Olanzapine Other (See Comments)    Unknown reaction    Voltaren [Diclofenac Sodium] Shortness Of Breath   Zetia [Ezetimibe] Other (See Comments)    Weakness in legs, shakiness all over   Ativan [Lorazepam] Other (See Comments)    Causes double vision at highter than .5 mg dose   Atorvastatin Other (See Comments)    Muscle aches and weakness   Budesonide-Formoterol Fumarate Other (See Comments)    Shakiness, tremors   Bupropion Hcl Other (See Comments)    "cloud over me" depression   Caffeine Other (See Comments)    jitters   Codeine Sulfate Other (See Comments)    Makes chest hurt like a heart attack   Lisinopril Cough   Metformin Nausea And Vomiting   Mometasone Furoate Nausea And Vomiting   Morphine Sulfate Other (See Comments)    Chest pain like a heart attack   Other Other (See Comments)    Beta Blockers, reaction shortness of breath   Oxycodone-Acetaminophen Nausea And Vomiting   Pioglitazone Other (See Comments)    Cannot take because of risk of bladder cancer   Propoxyphene N-Acetaminophen Nausea And Vomiting   Rosuvastatin Other (See  Comments)    Muscle aches and weakness   Shellfish Allergy Diarrhea   Suvorexant Other (See Comments)    Jerking/nervous    Ticagrelor     Other reaction(s): Other (See Comments) "slowed heart rate" & chest pain   Tramadol Nausea Only   Trazodone And Nefazodone Nausea And Vomiting   Venlafaxine Other (See Comments)    Unknown reaction   Zolpidem Tartrate Other (See Comments)     Jittery, diarrhea   Latex Rash    Home Medications: Medications Prior to Admission  Medication Sig Dispense  Refill Last Dose   ANORO ELLIPTA 62.5-25 MCG/INH AEPB Inhale 1 puff into the lungs daily.   unknown at unknown   aspirin EC 81 MG EC tablet Take 1 tablet (81 mg total) by mouth daily. Swallow whole. 30 tablet 11 unknown at unknown   Calcium-Vitamin D (CALTRATE 600 PLUS-VIT D PO) Take 1 tablet by mouth 2 (two) times daily.   unknown at unknown   clopidogrel (PLAVIX) 75 MG tablet Take 75 mg by mouth daily.   unknown at unknown   DULoxetine (CYMBALTA) 20 MG capsule Take 1 capsule (20 mg total) by mouth in the morning.   unknown at unknown   FLUoxetine (PROZAC) 40 MG capsule Take 40 mg by mouth 2 (two) times daily.   unknown at unknown   hydrOXYzine (ATARAX/VISTARIL) 25 MG tablet Take 1 tablet (25 mg total) by mouth 3 (three) times daily. 90 tablet 0 unknown at unknown   insulin lispro (HUMALOG) 100 UNIT/ML injection Inject 0-54 Units into the skin 4 (four) times daily -  before meals and at bedtime.   unknown at unknown   insulin NPH-regular Human (NOVOLIN 70/30) (70-30) 100 UNIT/ML injection Inject 60 Units into the skin 2 (two) times daily with a meal. Inject 60u under the skin every morning at breakfast and inject 60u under the skin every evening with supper   unknown at unknown   isosorbide mononitrate (IMDUR) 30 MG 24 hr tablet Take 1 tablet (30 mg total) by mouth daily. 30 tablet 11 unknown at unknown   levothyroxine (SYNTHROID, LEVOTHROID) 125 MCG tablet Take 125 mcg by mouth daily before breakfast.    unknown at unknown   losartan (COZAAR) 25 MG tablet Take 1 tablet (25 mg total) by mouth in the morning.   unknown at unknown   pantoprazole (PROTONIX) 40 MG tablet Take 40 mg by mouth in the morning.   unknown at unknown   potassium chloride (KLOR-CON) 10 MEQ tablet Take 1 tablet (10 mEq total) by mouth daily. 30 tablet 0    torsemide (DEMADEX) 20 MG tablet Take 20 mg by mouth daily.   unknown at unknown   traZODone (DESYREL) 100 MG tablet Take 200 mg by mouth at bedtime.   unknown at unknown   acetaminophen (TYLENOL) 500 MG tablet Take 500-1,000 mg by mouth every 6 (six) hours as needed (pain).   prn at prn   albuterol (PROVENTIL) (2.5 MG/3ML) 0.083% nebulizer solution Take 2.5 mg by nebulization every 4 (four) hours as needed for shortness of breath or wheezing.   prn at prn   insulin glargine (LANTUS) 100 UNIT/ML injection Inject 100 Units into the skin at bedtime. (Patient not taking: Reported on 02/08/2021)   Not Taking at unknown   nystatin (MYCOSTATIN) 100000 UNIT/ML suspension Take by mouth.   prn at prn   promethazine (PHENERGAN) 25 MG tablet Take 25 mg by mouth every 6 (six) hours as needed for nausea or vomiting.   prn at prn   Home medication reconciliation was completed with the patient.   Scheduled Inpatient Medications:    DULoxetine  20 mg Oral q AM   FLUoxetine  40 mg Oral BID   hydrOXYzine  25 mg Oral TID   insulin aspart  0-15 Units Subcutaneous TID AC & HS   insulin aspart protamine- aspart  60 Units Subcutaneous BID WC   ipratropium-albuterol  3 mL Nebulization QID   isosorbide mononitrate  30 mg Oral Daily   levothyroxine  125 mcg Oral Q0600  losartan  25 mg Oral q AM   methylPREDNISolone (SOLU-MEDROL) injection  40 mg Intravenous Q12H   Followed by   Derrill Memo ON 02/09/2021] predniSONE  40 mg Oral Q breakfast   [START ON 02/11/2021] pantoprazole  40 mg Intravenous Q12H   potassium chloride  10 mEq Oral Daily   torsemide  20 mg Oral Daily   traZODone  200 mg Oral QHS     Continuous Inpatient Infusions:    sodium chloride 50 mL/hr at 02/08/21 1208   pantoprazole 8 mg/hr (02/08/21 0545)    PRN Inpatient Medications:  acetaminophen **OR** acetaminophen, busPIRone, magnesium hydroxide, ondansetron **OR** ondansetron (ZOFRAN) IV  Family History: family history includes Asthma in her father; Breast cancer in her maternal aunt and paternal aunt; Breast cancer (age of onset: 46) in her mother; Heart disease in an other family member; Hypertension in her father; Mesothelioma in her father; Stroke in her paternal grandfather.   GI Family History: Negative  Social History:   reports that she quit smoking about 7 years ago. Her smoking use included cigarettes. She has a 15.00 pack-year smoking history. She has never used smokeless tobacco. She reports that she does not drink alcohol and does not use drugs. The patient denies ETOH, tobacco, or drug use.    Review of Systems: Review of Systems - Negative except that in HPI.  Physical Examination: BP (!) 117/55 (BP Location: Right Arm)   Pulse 72   Temp 97.7 F (36.5 C) (Oral)   Resp 19   Ht '5\' 6"'$  (1.676 m)   Wt 97.1 kg   SpO2 100%   BMI 34.55 kg/m  Physical Exam Vitals reviewed.  Constitutional:      General: She is not in acute distress.    Appearance: She is well-developed. She is obese. She is not ill-appearing, toxic-appearing or diaphoretic.  HENT:     Head: Normocephalic and atraumatic.  Eyes:     Pupils: Pupils are equal, round, and reactive to light.  Cardiovascular:     Rate and Rhythm: Normal rate.     Pulses: Normal pulses.  Pulmonary:     Breath sounds: Normal breath sounds. No decreased breath sounds or wheezing.  Chest:     Chest wall: No mass or tenderness.  Abdominal:     General: Bowel sounds are normal.     Palpations: Abdomen is soft. There is no mass.     Tenderness: There is no abdominal tenderness. There is no guarding or rebound.  Musculoskeletal:     Cervical back:  Normal range of motion.  Neurological:     Mental Status: She is alert and oriented to person, place, and time.  Psychiatric:        Mood and Affect: Mood normal.        Behavior: Behavior normal. Behavior is not agitated.    Data: Lab Results  Component Value Date   WBC 9.4 02/08/2021   HGB 8.9 (L) 02/08/2021   HCT 27.4 (L) 02/08/2021   MCV 86.2 02/08/2021   PLT 171 02/08/2021   Recent Labs  Lab 02/02/21 0359 02/07/21 2306 02/08/21 0630  HGB 10.8* 8.9* 8.9*   Lab Results  Component Value Date   NA 131 (L) 02/08/2021   K 4.7 02/08/2021   CL 96 (L) 02/08/2021   CO2 25 02/08/2021   BUN 20 02/08/2021   CREATININE 1.22 (H) 02/08/2021   Lab Results  Component Value Date   ALT 30 02/02/2021   AST 41 02/02/2021  ALKPHOS 81 02/02/2021   BILITOT 0.4 02/02/2021   No results for input(s): APTT, INR, PTT in the last 168 hours. CBC Latest Ref Rng & Units 02/08/2021 02/07/2021 02/02/2021  WBC 4.0 - 10.5 K/uL 9.4 10.4 10.4  Hemoglobin 12.0 - 15.0 g/dL 8.9(L) 8.9(L) 10.8(L)  Hematocrit 36.0 - 46.0 % 27.4(L) 26.6(L) 31.8(L)  Platelets 150 - 400 K/uL 171 167 168    STUDIES: DG Chest 2 View  Result Date: 02/07/2021 CLINICAL DATA:  Shortness of breath.  Chest pain. EXAM: CHEST - 2 VIEW COMPARISON:  Most recent radiograph and CT 02/01/2021. FINDINGS: Stable prominent cardiac contours post aortic valve replacement and median sternotomy. Unchanged mediastinal contours. Chronic coarse lung markings. No confluent airspace disease, pleural effusion, or pneumothorax. Ground-glass airspace disease on prior CT are not well seen by radiograph. IMPRESSION: No acute chest finding.  Chronic coarse lung markings. Electronically Signed   By: Keith Rake M.D.   On: 02/07/2021 22:47   CT Angio Chest PE W and/or Wo Contrast  Result Date: 02/08/2021 CLINICAL DATA:  PE suspected, high prob. Dyspnea. Nonproductive cough. EXAM: CT ANGIOGRAPHY CHEST WITH CONTRAST TECHNIQUE: Multidetector CT imaging of the  chest was performed using the standard protocol during bolus administration of intravenous contrast. Multiplanar CT image reconstructions and MIPs were obtained to evaluate the vascular anatomy. CONTRAST:  12m OMNIPAQUE IOHEXOL 350 MG/ML SOLN COMPARISON:  02/01/2021 FINDINGS: Cardiovascular: There is adequate opacification of the pulmonary arterial tree. No intraluminal filling defect through the segmental level to suggest acute pulmonary embolism. The central pulmonary arteries are enlarged in keeping with changes of pulmonary arterial hypertension. Extensive multi-vessel coronary artery calcification is noted. Probable stenting of the proximal right and left circumflex coronary arteries. Coronary artery bypass grafting and aortic valve replacement has been performed. Mild global cardiomegaly is present. Moderate largely noncalcified atheromatous plaque seen throughout the aorta. No aortic aneurysm. Mediastinum/Nodes: No pathologic thoracic adenopathy. The esophagus is unremarkable. Lungs/Pleura: Imaging is slightly limited by motion artifact. There is bilateral asymmetric patchy ground-glass pulmonary infiltrate more focal within the mid lung zones bilaterally possibly infectious or inflammatory in nature as can be seen with COVID-19 pneumonia. This has progressed since prior examination. No pneumothorax or pleural effusion. Central airways are widely patent. Upper Abdomen: Cholecystectomy has been performed. No acute abnormality. Musculoskeletal: No acute bone abnormality. No lytic or blastic bone lesion. Review of the MIP images confirms the above findings. IMPRESSION: No pulmonary embolism. Mild cardiomegaly. Morphologic changes in keeping with pulmonary arterial hypertension. Progressive patchy bilateral mid lung zone ground-glass pulmonary infiltrate, possibly infectious or inflammatory in nature. Serologic correlation for COVID-19 pneumonia may be helpful. Aortic Atherosclerosis (ICD10-I70.0).  Electronically Signed   By: AFidela SalisburyM.D.   On: 02/08/2021 02:55   '@IMAGES'$ @  Assessment: COPD exacerbation. Improving clinically since hospital admission early this AM. Anemia, possibly from occult GI blood loss vs. Chronic disease. Hemoccult positive stool. Personal history of benign tubular adenomas of the colon (2014). Personal history of colonic diverticulosis. Severe ASCVD s/p 2v CABG (2015) followed by subsequent Left heart cath with coronary angiographies. History of CHF without current evidence of fluid overload.    Recommendations:  Continue current medical treatment. Advise no inpatient endoluminal evaluation at this time. Await Cardiology consult. Outpatient endoscopy and colonoscopy after hospital follow up visit in office. 3-4 weeks. I have arranged for my office to call patient. GI sign off. Call back in the interim if we can help.  Thank you for the consult. Please call with questions or  concerns.  Olean Ree, "Lanny Hurst MD [facility] [place], [place] [address] [phone]  02/08/2021 1:53 PM

## 2021-02-08 NOTE — ED Notes (Signed)
Pt resting quietly at this time, respirations equal and unlabored.

## 2021-02-09 ENCOUNTER — Inpatient Hospital Stay: Payer: Medicare HMO

## 2021-02-09 ENCOUNTER — Encounter: Payer: Self-pay | Admitting: Oncology

## 2021-02-09 DIAGNOSIS — J441 Chronic obstructive pulmonary disease with (acute) exacerbation: Secondary | ICD-10-CM | POA: Diagnosis not present

## 2021-02-09 DIAGNOSIS — R079 Chest pain, unspecified: Secondary | ICD-10-CM | POA: Diagnosis not present

## 2021-02-09 DIAGNOSIS — J9611 Chronic respiratory failure with hypoxia: Secondary | ICD-10-CM

## 2021-02-09 LAB — BASIC METABOLIC PANEL
Anion gap: 9 (ref 5–15)
BUN: 31 mg/dL — ABNORMAL HIGH (ref 8–23)
CO2: 28 mmol/L (ref 22–32)
Calcium: 7.9 mg/dL — ABNORMAL LOW (ref 8.9–10.3)
Chloride: 93 mmol/L — ABNORMAL LOW (ref 98–111)
Creatinine, Ser: 1.27 mg/dL — ABNORMAL HIGH (ref 0.44–1.00)
GFR, Estimated: 44 mL/min — ABNORMAL LOW (ref >60)
Glucose, Bld: 381 mg/dL — ABNORMAL HIGH (ref 70–99)
Potassium: 4.5 mmol/L (ref 3.5–5.1)
Sodium: 130 mmol/L — ABNORMAL LOW (ref 135–145)

## 2021-02-09 LAB — CBC WITH DIFFERENTIAL/PLATELET
Abs Immature Granulocytes: 0.09 10*3/uL — ABNORMAL HIGH (ref 0.00–0.07)
Basophils Absolute: 0 10*3/uL (ref 0.0–0.1)
Basophils Relative: 0 %
Eosinophils Absolute: 0 10*3/uL (ref 0.0–0.5)
Eosinophils Relative: 0 %
HCT: 26.3 % — ABNORMAL LOW (ref 36.0–46.0)
Hemoglobin: 9 g/dL — ABNORMAL LOW (ref 12.0–15.0)
Immature Granulocytes: 1 %
Lymphocytes Relative: 4 %
Lymphs Abs: 0.6 10*3/uL — ABNORMAL LOW (ref 0.7–4.0)
MCH: 29 pg (ref 26.0–34.0)
MCHC: 34.2 g/dL (ref 30.0–36.0)
MCV: 84.8 fL (ref 80.0–100.0)
Monocytes Absolute: 0.6 10*3/uL (ref 0.1–1.0)
Monocytes Relative: 4 %
Neutro Abs: 13.1 10*3/uL — ABNORMAL HIGH (ref 1.7–7.7)
Neutrophils Relative %: 91 %
Platelets: 196 10*3/uL (ref 150–400)
RBC: 3.1 MIL/uL — ABNORMAL LOW (ref 3.87–5.11)
RDW: 13.2 % (ref 11.5–15.5)
WBC: 14.4 10*3/uL — ABNORMAL HIGH (ref 4.0–10.5)
nRBC: 0 % (ref 0.0–0.2)

## 2021-02-09 LAB — TROPONIN I (HIGH SENSITIVITY)
Troponin I (High Sensitivity): 1202 ng/L (ref <18)
Troponin I (High Sensitivity): 1360 ng/L (ref <18)
Troponin I (High Sensitivity): 459 ng/L (ref <18)
Troponin I (High Sensitivity): 619 ng/L (ref <18)

## 2021-02-09 LAB — GLUCOSE, CAPILLARY
Glucose-Capillary: 325 mg/dL — ABNORMAL HIGH (ref 70–99)
Glucose-Capillary: 335 mg/dL — ABNORMAL HIGH (ref 70–99)
Glucose-Capillary: 342 mg/dL — ABNORMAL HIGH (ref 70–99)
Glucose-Capillary: 456 mg/dL — ABNORMAL HIGH (ref 70–99)
Glucose-Capillary: 478 mg/dL — ABNORMAL HIGH (ref 70–99)

## 2021-02-09 LAB — MAGNESIUM: Magnesium: 1.8 mg/dL (ref 1.7–2.4)

## 2021-02-09 MED ORDER — ONDANSETRON HCL 4 MG PO TABS
4.0000 mg | ORAL_TABLET | Freq: Three times a day (TID) | ORAL | Status: DC | PRN
  Filled 2021-02-09: qty 1

## 2021-02-09 MED ORDER — GUAIFENESIN ER 600 MG PO TB12
600.0000 mg | ORAL_TABLET | Freq: Two times a day (BID) | ORAL | Status: DC
  Administered 2021-02-09 – 2021-02-13 (×8): 600 mg via ORAL
  Filled 2021-02-09 (×8): qty 1

## 2021-02-09 MED ORDER — ASPIRIN EC 81 MG PO TBEC
81.0000 mg | DELAYED_RELEASE_TABLET | Freq: Every day | ORAL | Status: DC
  Administered 2021-02-09 – 2021-02-13 (×5): 81 mg via ORAL
  Filled 2021-02-09 (×5): qty 1

## 2021-02-09 MED ORDER — METHYLPREDNISOLONE 4 MG PO TBPK
8.0000 mg | ORAL_TABLET | Freq: Every morning | ORAL | Status: AC
  Administered 2021-02-09: 8 mg via ORAL
  Filled 2021-02-09: qty 21

## 2021-02-09 MED ORDER — METHYLPREDNISOLONE 4 MG PO TBPK: 4.0000 mg | ORAL_TABLET | ORAL | Status: AC

## 2021-02-09 MED ORDER — FUROSEMIDE 40 MG PO TABS
40.0000 mg | ORAL_TABLET | Freq: Every day | ORAL | Status: DC
  Administered 2021-02-09 – 2021-02-10 (×2): 40 mg via ORAL
  Filled 2021-02-09 (×2): qty 1

## 2021-02-09 MED ORDER — METHYLPREDNISOLONE 4 MG PO TBPK
4.0000 mg | ORAL_TABLET | Freq: Three times a day (TID) | ORAL | Status: AC
  Administered 2021-02-10 (×3): 4 mg via ORAL

## 2021-02-09 MED ORDER — IPRATROPIUM-ALBUTEROL 0.5-2.5 (3) MG/3ML IN SOLN
3.0000 mL | Freq: Two times a day (BID) | RESPIRATORY_TRACT | Status: DC
  Administered 2021-02-09 – 2021-02-10 (×2): 3 mL via RESPIRATORY_TRACT
  Filled 2021-02-09 (×2): qty 3

## 2021-02-09 MED ORDER — METHYLPREDNISOLONE 4 MG PO TBPK
8.0000 mg | ORAL_TABLET | Freq: Every evening | ORAL | Status: AC
  Administered 2021-02-09: 8 mg via ORAL

## 2021-02-09 MED ORDER — SPIRONOLACTONE 25 MG PO TABS
25.0000 mg | ORAL_TABLET | Freq: Every day | ORAL | Status: DC
  Administered 2021-02-09 – 2021-02-10 (×2): 25 mg via ORAL
  Filled 2021-02-09 (×2): qty 1

## 2021-02-09 MED ORDER — METHYLPREDNISOLONE 4 MG PO TBPK
4.0000 mg | ORAL_TABLET | Freq: Four times a day (QID) | ORAL | Status: DC
Start: 2021-02-11 — End: 2021-02-13
  Administered 2021-02-11 – 2021-02-13 (×8): 4 mg via ORAL

## 2021-02-09 MED ORDER — ALPRAZOLAM 0.5 MG PO TABS
0.5000 mg | ORAL_TABLET | Freq: Three times a day (TID) | ORAL | Status: DC | PRN
  Administered 2021-02-09 – 2021-02-13 (×7): 0.5 mg via ORAL
  Filled 2021-02-09 (×7): qty 1

## 2021-02-09 MED ORDER — CLOPIDOGREL BISULFATE 75 MG PO TABS
75.0000 mg | ORAL_TABLET | Freq: Every day | ORAL | Status: DC
  Administered 2021-02-09 – 2021-02-13 (×5): 75 mg via ORAL
  Filled 2021-02-09 (×5): qty 1

## 2021-02-09 MED ORDER — METHYLPREDNISOLONE 4 MG PO TBPK
4.0000 mg | ORAL_TABLET | ORAL | Status: AC
  Administered 2021-02-09: 4 mg via ORAL

## 2021-02-09 MED ORDER — INSULIN ASPART 100 UNIT/ML IJ SOLN
25.0000 [IU] | Freq: Once | INTRAMUSCULAR | Status: AC
  Administered 2021-02-09: 25 [IU] via SUBCUTANEOUS
  Filled 2021-02-09: qty 1

## 2021-02-09 MED ORDER — METHYLPREDNISOLONE 4 MG PO TBPK
8.0000 mg | ORAL_TABLET | Freq: Every evening | ORAL | Status: AC
  Administered 2021-02-10: 8 mg via ORAL

## 2021-02-09 NOTE — Progress Notes (Signed)
Pt's RT assessment score was 4. She is without wheezes and states she is back to baseline with breathing. The nebulizer treatments are changed to BID.

## 2021-02-09 NOTE — Progress Notes (Signed)
Patient ID: [name], female   DOB: [birth year], 75 y.o.   MRN: [id number]  PROGRESS NOTE    [name]  [id number] DOB: 23-Feb-1946 DOA: 02/08/2021 PCP: Rusty Aus, MD   Brief Narrative:  75 year old female with history of asthma/COPD, CAD status post CABG and PCI, diabetes mellitus type 2, GERD, headache, anxiety, recent 2 hospitalizations for COPD exacerbation treated with steroids presented again with worsening shortness of breath.  On presentation, high-sensitivity troponin was 101 113 with BNP of 423.  Stool Hemoccult was positive.  Chest x-ray showed chronic coarse lung markings with no acute chest findings.  She was treated with IV Lasix and Solu-Medrol.  Cardiology/GI/pulmonary were consulted.  Assessment & Plan:   Possible COPD exacerbation Chronic respiratory failure with hypoxia -CTA chest showed no pulmonary embolism but showed progressive patchy bilateral mid lung zone groundglass pulmonary infiltrate, possibly infectious or inflammatory in nature.  COVID-19 test was negative.  Pulmonary following.  Currently on IV Lasix.  IV Solu-Medrol has been switched to tapering Medrol by pulmonary.  Continue nebs -Patient on 3 L oxygen by nasal cannula at home.  Currently on the same.  Chest tightness/positive troponin in patient with CAD status post PCI and CABG and AVR -Cardiology following.  High-sensitivity troponins repeated this morning: 1202 and 1360 subsequently.  Currently no chest pain. -Aspirin and Plavix have been resumed by cardiology.  No significant EKG changes per cardiology.  Acute pulmonary edema -Currently on IV Lasix.  Strict input and output.  Daily weights.  Fluid restriction.  Anemia of chronic disease Fecal occult blood positive in stool -Patient has chronic anemia with only mild drop in hemoglobin.  No evidence of overt GI bleeding.  GI consult appreciated.  No need for inpatient procedures.  Antiplatelets have been resumed today. -Hemoglobin 9  today.  Mild hyponatremia -monitor  Diabetes mellitus type 2 with hyperglycemia -Hyperglycemia aggravated by steroid use.  Continue NPH along with sliding scale with insulin.  Depression -Continue Prozac and Cymbalta.  Low-dose Xanax has been started by pulmonary.  Psychiatry consultation has been requested by pulmonary.  Generalized deconditioning -Overall prognosis is guarded to poor.  Currently listed as full code.  Palliative care consultation for goals of care discussion -PT recommends home health PT  Hypertension -Blood pressure stable.  Continue Lasix.  Hypothyroidism--continue Synthroid   DVT prophylaxis: SCDs Code Status: Full Family Communication: Husband at bedside on 02/08/2021. Disposition Plan: Status is: Inpatient  Remains inpatient appropriate because:Inpatient level of care appropriate due to severity of illness  Dispo: The patient is from: Home              Anticipated d/c is to: Home              Patient currently is not medically stable to d/c.   Difficult to place patient No   Consultants: Cardiology/GI/pulmonary  Procedures: None  Antimicrobials: None   Subjective: Patient seen and examined at bedside.  Denies current chest pain.  Still short of breath with exertion.  Feels slightly anxious.  No overnight fever or vomiting reported.  Objective: Vitals:   02/09/21 0011 02/09/21 0412 02/09/21 0740 02/09/21 0742  BP: 132/70 140/66  (!) 149/80  Pulse: 79 64  74  Resp: '18 20  17  '$ Temp: 98 F (36.7 C) (!) 97.4 F (36.3 C)  98.4 F (36.9 C)  TempSrc:      SpO2: 100% 100% 100% 100%  Weight:      Height:  Intake/Output Summary (Last 24 hours) at 02/09/2021 1115 Last data filed at 02/09/2021 1016 Gross per 24 hour  Intake 696.75 ml  Output 700 ml  Net -3.25 ml   Filed Weights   02/07/21 2223 02/08/21 1206  Weight: 90.7 kg 97.1 kg    Examination:  General exam: Appears calm and comfortable.  Looks chronically ill and deconditioned.   On 3 L oxygen via nasal cannula currently. Respiratory system: Bilateral decreased breath sounds at bases with scattered crackles Cardiovascular system: S1 & S2 heard, Rate controlled Gastrointestinal system: Abdomen is nondistended, soft and nontender. Normal bowel sounds heard. Extremities: No cyanosis, clubbing; trace lower extremity edema present Central nervous system: Alert and oriented.  Slow to respond.  Poor historian.  No focal neurological deficits. Moving extremities Skin: No rashes, lesions or ulcers Psychiatry: Looks intermittently anxious.  Otherwise mostly flat affect.   Data Reviewed: I have personally reviewed following labs and imaging studies  CBC: Recent Labs  Lab 02/07/21 2306 02/08/21 0630 02/09/21 0427  WBC 10.4 9.4 14.4*  NEUTROABS  --   --  13.1*  HGB 8.9* 8.9* 9.0*  HCT 26.6* 27.4* 26.3*  MCV 86.6 86.2 84.8  PLT 167 171 [id number]   Basic Metabolic Panel: Recent Labs  Lab 02/07/21 2306 02/08/21 0630 02/09/21 0427  NA 131* 131* 130*  K 5.3* 4.7 4.5  CL 100 96* 93*  CO2 '25 25 28  '$ GLUCOSE 233* 429* 381*  BUN 17 20 31*  CREATININE 1.04* 1.22* 1.27*  CALCIUM 8.0* 7.9* 7.9*  MG  --   --  1.8   GFR: Estimated Creatinine Clearance: 44.3 mL/min (A) (by C-G formula based on SCr of 1.27 mg/dL (H)). Liver Function Tests: No results for input(s): AST, ALT, ALKPHOS, BILITOT, PROT, ALBUMIN in the last 168 hours. No results for input(s): LIPASE, AMYLASE in the last 168 hours. No results for input(s): AMMONIA in the last 168 hours. Coagulation Profile: No results for input(s): INR, PROTIME in the last 168 hours. Cardiac Enzymes: No results for input(s): CKTOTAL, CKMB, CKMBINDEX, TROPONINI in the last 168 hours. BNP (last 3 results) No results for input(s): PROBNP in the last 8760 hours. HbA1C: Recent Labs    02/08/21 0630  HGBA1C 8.1*   CBG: Recent Labs  Lab 02/08/21 1654 02/08/21 2017 02/08/21 2159 02/09/21 0014 02/09/21 0740  GLUCAP 391* 551*  504* 456* 325*   Lipid Profile: No results for input(s): CHOL, HDL, LDLCALC, TRIG, CHOLHDL, LDLDIRECT in the last 72 hours. Thyroid Function Tests: No results for input(s): TSH, T4TOTAL, FREET4, T3FREE, THYROIDAB in the last 72 hours. Anemia Panel: No results for input(s): VITAMINB12, FOLATE, FERRITIN, TIBC, IRON, RETICCTPCT in the last 72 hours. Sepsis Labs: No results for input(s): PROCALCITON, LATICACIDVEN in the last 168 hours.  Recent Results (from the past 240 hour(s))  Resp Panel by RT-PCR (Flu A&B, Covid) Nasopharyngeal Swab     Status: None   Collection Time: 02/01/21 12:10 PM   Specimen: Nasopharyngeal Swab; Nasopharyngeal(NP) swabs in vial transport medium  Result Value Ref Range Status   SARS Coronavirus 2 by RT PCR NEGATIVE NEGATIVE Final    Comment: (NOTE) SARS-CoV-2 target nucleic acids are NOT DETECTED.  The SARS-CoV-2 RNA is generally detectable in upper respiratory specimens during the acute phase of infection. The lowest concentration of SARS-CoV-2 viral copies this assay can detect is 138 copies/mL. A negative result does not preclude SARS-Cov-2 infection and should not be used as the sole basis for treatment or other patient management decisions. A  negative result may occur with  improper specimen collection/handling, submission of specimen other than nasopharyngeal swab, presence of viral mutation(s) within the areas targeted by this assay, and inadequate number of viral copies(<138 copies/mL). A negative result must be combined with clinical observations, patient history, and epidemiological information. The expected result is Negative.  Fact Sheet for Patients:  EntrepreneurPulse.com.au  Fact Sheet for Healthcare Providers:  IncredibleEmployment.be  This test is no t yet approved or cleared by the [place] FDA and  has been authorized for detection and/or diagnosis of SARS-CoV-2 by FDA under an Emergency Use  Authorization (EUA). This EUA will remain  in effect (meaning this test can be used) for the duration of the COVID-19 declaration under Section 564(b)(1) of the Act, 21 U.S.C.section 360bbb-3(b)(1), unless the authorization is terminated  or revoked sooner.       Influenza A by PCR NEGATIVE NEGATIVE Final   Influenza B by PCR NEGATIVE NEGATIVE Final    Comment: (NOTE) The Xpert Xpress SARS-CoV-2/FLU/RSV plus assay is intended as an aid in the diagnosis of influenza from Nasopharyngeal swab specimens and should not be used as a sole basis for treatment. Nasal washings and aspirates are unacceptable for Xpert Xpress SARS-CoV-2/FLU/RSV testing.  Fact Sheet for Patients: EntrepreneurPulse.com.au  Fact Sheet for Healthcare Providers: IncredibleEmployment.be  This test is not yet approved or cleared by the [place] FDA and has been authorized for detection and/or diagnosis of SARS-CoV-2 by FDA under an Emergency Use Authorization (EUA). This EUA will remain in effect (meaning this test can be used) for the duration of the COVID-19 declaration under Section 564(b)(1) of the Act, 21 U.S.C. section 360bbb-3(b)(1), unless the authorization is terminated or revoked.  Performed at [facility], [address] [place], [place] [address]   Urine Culture     Status: Abnormal   Collection Time: 02/01/21 10:20 PM   Specimen: Urine, Random  Result Value Ref Range Status   Specimen Description   Final    URINE, RANDOM Performed at [facility], [address] [place], [place] [address]    Special Requests   Final    NONE Performed at [facility], [place], [place] [address]    Culture (A)  Final    >=100,000 COLONIES/mL KLEBSIELLA PNEUMONIAE 50,000 COLONIES/mL ESCHERICHIA COLI    Report Status 02/05/2021 FINAL  Final   Organism ID, Bacteria KLEBSIELLA PNEUMONIAE (A)  Final   Organism ID, Bacteria  ESCHERICHIA COLI (A)  Final      Susceptibility   Escherichia coli - MIC*    AMPICILLIN 4 SENSITIVE Sensitive     CEFAZOLIN <=4 SENSITIVE Sensitive     CEFEPIME <=0.12 SENSITIVE Sensitive     CEFTRIAXONE <=0.25 SENSITIVE Sensitive     CIPROFLOXACIN <=0.25 SENSITIVE Sensitive     GENTAMICIN <=1 SENSITIVE Sensitive     IMIPENEM <=0.25 SENSITIVE Sensitive     NITROFURANTOIN <=16 SENSITIVE Sensitive     TRIMETH/SULFA <=20 SENSITIVE Sensitive     AMPICILLIN/SULBACTAM <=2 SENSITIVE Sensitive     PIP/TAZO <=4 SENSITIVE Sensitive     * 50,000 COLONIES/mL ESCHERICHIA COLI   Klebsiella pneumoniae - MIC*    AMPICILLIN RESISTANT Resistant     CEFAZOLIN <=4 SENSITIVE Sensitive     CEFEPIME <=0.12 SENSITIVE Sensitive     CEFTRIAXONE <=0.25 SENSITIVE Sensitive     CIPROFLOXACIN <=0.25 SENSITIVE Sensitive     GENTAMICIN <=1 SENSITIVE Sensitive     IMIPENEM <=0.25 SENSITIVE Sensitive  NITROFURANTOIN <=16 SENSITIVE Sensitive     TRIMETH/SULFA <=20 SENSITIVE Sensitive     AMPICILLIN/SULBACTAM 4 SENSITIVE Sensitive     PIP/TAZO <=4 SENSITIVE Sensitive     * >=100,000 COLONIES/mL KLEBSIELLA PNEUMONIAE  Resp Panel by RT-PCR (Flu A&B, Covid) Nasopharyngeal Swab     Status: None   Collection Time: 02/08/21  2:00 AM   Specimen: Nasopharyngeal Swab; Nasopharyngeal(NP) swabs in vial transport medium  Result Value Ref Range Status   SARS Coronavirus 2 by RT PCR NEGATIVE NEGATIVE Final    Comment: (NOTE) SARS-CoV-2 target nucleic acids are NOT DETECTED.  The SARS-CoV-2 RNA is generally detectable in upper respiratory specimens during the acute phase of infection. The lowest concentration of SARS-CoV-2 viral copies this assay can detect is 138 copies/mL. A negative result does not preclude SARS-Cov-2 infection and should not be used as the sole basis for treatment or other patient management decisions. A negative result may occur with  improper specimen collection/handling, submission of specimen  other than nasopharyngeal swab, presence of viral mutation(s) within the areas targeted by this assay, and inadequate number of viral copies(<138 copies/mL). A negative result must be combined with clinical observations, patient history, and epidemiological information. The expected result is Negative.  Fact Sheet for Patients:  EntrepreneurPulse.com.au  Fact Sheet for Healthcare Providers:  IncredibleEmployment.be  This test is no t yet approved or cleared by the [place] FDA and  has been authorized for detection and/or diagnosis of SARS-CoV-2 by FDA under an Emergency Use Authorization (EUA). This EUA will remain  in effect (meaning this test can be used) for the duration of the COVID-19 declaration under Section 564(b)(1) of the Act, 21 U.S.C.section 360bbb-3(b)(1), unless the authorization is terminated  or revoked sooner.       Influenza A by PCR NEGATIVE NEGATIVE Final   Influenza B by PCR NEGATIVE NEGATIVE Final    Comment: (NOTE) The Xpert Xpress SARS-CoV-2/FLU/RSV plus assay is intended as an aid in the diagnosis of influenza from Nasopharyngeal swab specimens and should not be used as a sole basis for treatment. Nasal washings and aspirates are unacceptable for Xpert Xpress SARS-CoV-2/FLU/RSV testing.  Fact Sheet for Patients: EntrepreneurPulse.com.au  Fact Sheet for Healthcare Providers: IncredibleEmployment.be  This test is not yet approved or cleared by the [place] FDA and has been authorized for detection and/or diagnosis of SARS-CoV-2 by FDA under an Emergency Use Authorization (EUA). This EUA will remain in effect (meaning this test can be used) for the duration of the COVID-19 declaration under Section 564(b)(1) of the Act, 21 U.S.C. section 360bbb-3(b)(1), unless the authorization is terminated or revoked.  Performed at [facility], [address],  [place], [place] [address]          Radiology Studies: DG Chest 2 View  Result Date: 02/07/2021 CLINICAL DATA:  Shortness of breath.  Chest pain. EXAM: CHEST - 2 VIEW COMPARISON:  Most recent radiograph and CT 02/01/2021. FINDINGS: Stable prominent cardiac contours post aortic valve replacement and median sternotomy. Unchanged mediastinal contours. Chronic coarse lung markings. No confluent airspace disease, pleural effusion, or pneumothorax. Ground-glass airspace disease on prior CT are not well seen by radiograph. IMPRESSION: No acute chest finding.  Chronic coarse lung markings. Electronically Signed   By: Keith Rake M.D.   On: 02/07/2021 22:47   CT Angio Chest PE W and/or Wo Contrast  Result Date: 02/08/2021 CLINICAL DATA:  PE suspected, high prob. Dyspnea. Nonproductive cough. EXAM: CT ANGIOGRAPHY CHEST WITH CONTRAST TECHNIQUE: Multidetector CT imaging  of the chest was performed using the standard protocol during bolus administration of intravenous contrast. Multiplanar CT image reconstructions and MIPs were obtained to evaluate the vascular anatomy. CONTRAST:  29m OMNIPAQUE IOHEXOL 350 MG/ML SOLN COMPARISON:  02/01/2021 FINDINGS: Cardiovascular: There is adequate opacification of the pulmonary arterial tree. No intraluminal filling defect through the segmental level to suggest acute pulmonary embolism. The central pulmonary arteries are enlarged in keeping with changes of pulmonary arterial hypertension. Extensive multi-vessel coronary artery calcification is noted. Probable stenting of the proximal right and left circumflex coronary arteries. Coronary artery bypass grafting and aortic valve replacement has been performed. Mild global cardiomegaly is present. Moderate largely noncalcified atheromatous plaque seen throughout the aorta. No aortic aneurysm. Mediastinum/Nodes: No pathologic thoracic adenopathy. The esophagus is unremarkable. Lungs/Pleura: Imaging is slightly limited by motion  artifact. There is bilateral asymmetric patchy ground-glass pulmonary infiltrate more focal within the mid lung zones bilaterally possibly infectious or inflammatory in nature as can be seen with COVID-19 pneumonia. This has progressed since prior examination. No pneumothorax or pleural effusion. Central airways are widely patent. Upper Abdomen: Cholecystectomy has been performed. No acute abnormality. Musculoskeletal: No acute bone abnormality. No lytic or blastic bone lesion. Review of the MIP images confirms the above findings. IMPRESSION: No pulmonary embolism. Mild cardiomegaly. Morphologic changes in keeping with pulmonary arterial hypertension. Progressive patchy bilateral mid lung zone ground-glass pulmonary infiltrate, possibly infectious or inflammatory in nature. Serologic correlation for COVID-19 pneumonia may be helpful. Aortic Atherosclerosis (ICD10-I70.0). Electronically Signed   By: AFidela SalisburyM.D.   On: 02/08/2021 02:55   DG Chest Port 1 View  Result Date: 02/09/2021 CLINICAL DATA:  Bilateral pulmonary infiltrates on CXR EXAM: PORTABLE CHEST - 1 VIEW COMPARISON:  CT from previous day FINDINGS: Coarse nodular airspace opacities throughout both lung fields as before. No pleural effusion. No pneumothorax. Heart size upper limits normal.  Right hilar fullness. Sternotomy wires. Visualized bones unremarkable. IMPRESSION: 1. Persistent coarse nodular bilateral airspace opacities. 2. Right hilar fullness presumably related to regional airspace disease. Electronically Signed   By: DLucrezia EuropeM.D.   On: 02/09/2021 08:00        Scheduled Meds:  ALPRAZolam  0.5 mg Oral TID   aspirin EC  81 mg Oral Daily   clopidogrel  75 mg Oral Daily   DULoxetine  20 mg Oral q AM   FLUoxetine  40 mg Oral BID   furosemide  40 mg Intravenous BID   insulin aspart  0-20 Units Subcutaneous TID WC   insulin aspart protamine- aspart  60 Units Subcutaneous BID WC   ipratropium-albuterol  3 mL Nebulization BID    levothyroxine  125 mcg Oral Q0600   methylPREDNISolone  4 mg Oral PC lunch   methylPREDNISolone  4 mg Oral PC supper   [START ON 02/10/2021] methylPREDNISolone  4 mg Oral 3 x daily with food   [START ON 02/11/2021] methylPREDNISolone  4 mg Oral 4X daily taper   methylPREDNISolone  8 mg Oral Nightly   [START ON 02/10/2021] methylPREDNISolone  8 mg Oral Nightly   pantoprazole  40 mg Oral Daily   potassium chloride  10 mEq Oral Daily   Continuous Infusions:        KAline August MD [facility] 02/09/2021, 11:15 AM

## 2021-02-09 NOTE — Progress Notes (Signed)
Lab called with critical troponin 1,202. Provider Sharion Settler aware. See orders. Pt asymptomatic. Pt sleeping . Will continue to monitor.

## 2021-02-09 NOTE — Progress Notes (Signed)
Inpatient Diabetes Program Recommendations  AACE/ADA: New Consensus Statement on Inpatient Glycemic Control   Target Ranges:  Prepandial:   less than 140 mg/dL      Peak postprandial:   less than 180 mg/dL (1-2 hours)      Critically ill patients:  140 - 180 mg/dL  Results for DEMETRIS, SWISSHELM (MRN [id number]) as of 02/09/2021 07:45  Ref. Range 02/09/2021 04:27  Glucose Latest Ref Range: 70 - 99 mg/dL 381 (H)   Results for LIHANNA, UNRUH (MRN [id number]) as of 02/09/2021 07:45  Ref. Range 02/08/2021 07:43 02/08/2021 08:20 02/08/2021 12:51 02/08/2021 16:54 02/08/2021 20:17 02/08/2021 21:59  Glucose-Capillary Latest Ref Range: 70 - 99 mg/dL 416 (H) 400 (H)  Novolog 15 units 339 (H)  Novolog 11 units 391 (H)  Novolog 15 units 551 (HH)  Novolog 25 units 504 (HH)   Review of Glycemic Control  Diabetes history: DM2 Outpatient Diabetes medications: 70/30 60 units BID, Humalog 0-54 units QID Current orders for Inpatient glycemic control: 70/30 60 units BID, Novolog 0-20 units TID with meals; Solumedrol 20 mg Q12H  Inpatient Diabetes Program Recommendations:    Insulin: 70/30 insulin was NOT given at all on 02/08/21.  As a result, glucose up to 551 mg/dl at 20:17 on 02/08/21 and lab glucose 381 mg/dl today.   Thanks, Barnie Alderman, RN, MSN, CDE Diabetes Coordinator Inpatient Diabetes Program [phone] (Team Pager from 8am to 5pm)

## 2021-02-09 NOTE — Consult Note (Signed)
[facility] Consult   Reason for Consult: Consult for 75 year old woman with chronic respiratory problems and anxiety.  Concern about worsening anxiety. Referring Physician:  Starla Link Patient Identification: [name] MRN:  [id number] Principal Diagnosis: GAD (generalized anxiety disorder) Diagnosis:  Principal Problem:   GAD (generalized anxiety disorder) Active Problems:   COPD exacerbation ([place])   Total Time spent with patient: 1 hour  Subjective:   [name] is a 75 y.o. female patient admitted with "I just wish I could go home".  HPI: Patient seen chart reviewed.  Patient's husband was also in the room with her permission.  Patient with COPD and recurrent hospitalizations with shortness of breath.  Concern about anxiety.  Patient says her current nervousness and anxiety is mostly because she wishes she could go home.  She repeats multiple times that she does not like being in the hospital and it makes her feel more nervous and she thinks she would be better if she could get home to her own house.  Her husband points out that she is anxious when she is at home too and has had several recent hospitalizations but the patient still insists that getting home would be the most helpful thing for her.  She has longstanding chronic generalized anxiety made worse when she is short of breath at home.  Patient denies feeling particularly depressed.  Denies any hopelessness.  Denies any suicidal thought.  Says that at home she sleeps a little bit better.  Gets up to go to the bathroom at night but is able to fall back asleep.  In the hospital has trouble sleeping because of the frequent interruptions.  Denies any psychotic symptoms.  Patient is on longstanding treatment with Prozac at home.  Not on regular benzodiazepines at home.  Has been started on Xanax here in the hospital.  Had a dose this morning and barely noticed it.  Past Psychiatric History: Patient says she last saw a  psychiatrist decades ago.  That was for depression but has not had specific treatment for depression since then.  Has been on fluoxetine and low-dose Cymbalta for a long time.  She denies any history of suicide attempts.  Denies any hospitalization.  Denies any history of alcohol or drug abuse  Risk to Self:   Risk to Others:   Prior Inpatient Therapy:   Prior Outpatient Therapy:    Past Medical History:  Past Medical History:  Diagnosis Date   1st degree AV block    ACE-inhibitor cough    Allergic rhinitis    Anemia    iron deficiency anemia   Anxiety    Aortic ectasia (HCC)    a. CT abd in 12/2016 incidentally noted aortic atherosclerosis and infrarenal abdominal aortic ectasia measuring as large as 2.7 cm with recommendation to repeat US in 2023.   Arthritis    Asthma    Cataract    Chronic depression    Chronic diastolic CHF (congestive heart failure) (HCC)    Chronic headache    COPD (chronic obstructive pulmonary disease) (HCC)    Coronary artery disease    a. DES to RCA and mid Cx 2009. b. CABG and bioprosthetic AVR May 2015. c. cutting balloon to prox Cx in 05/2016   Diabetes mellitus    type 2   Diverticulitis of colon    Essential hypertension    GERD (gastroesophageal reflux disease)    Hearing loss    History of blood transfusion 2013   History  of prosthetic aortic valve replacement    HOH (hard of hearing)    Hypercholesterolemia    intolerance of statins and niaspan   IDA (iron deficiency anemia) 02/03/2019   Mobitz type 1 second degree AV block    OSA (obstructive sleep apnea)    mild, intolerant of cpap   PAD (peripheral artery disease) ([place])    a. atherosclerosis by CT abd 12/2016 in [facility].   PONV (postoperative nausea and vomiting)    Statin intolerance    Thyroid disease     Past Surgical History:  Procedure Laterality Date   ABDOMINAL HYSTERECTOMY     ABDOMINAL HYSTERECTOMY W/ PARTIAL VAGINACTOMY     AORTIC VALVE REPLACEMENT N/A 10/12/2013   Procedure:  AORTIC VALVE REPLACEMENT (AVR);  Surgeon: [name], MD;  Location: [facility];  Service: Open Heart Surgery;  Laterality: N/A;   APPENDECTOMY  1964   BARTHOLIN GLAND CYST EXCISION     BLADDER SUSPENSION     BREAST BIOPSY Bilateral 09/11/2000   neg   BREAST BIOPSY Left 07/24/2010   neg   BREAST CYST EXCISION  1988   bilateral nonmalignant tumors, x3   CARDIAC CATHETERIZATION     CARDIAC CATHETERIZATION N/A 05/25/2016   Procedure: Coronary Balloon Angioplasty;  Surgeon: Leonie Man, MD;  Location: [facility];  Service: Cardiovascular;  Laterality: N/A;   CARDIAC CATHETERIZATION N/A 05/25/2016   Procedure: Coronary/Graft Angiography;  Surgeon: Leonie Man, MD;  Location: [facility];  Service: Cardiovascular;  Laterality: N/A;   CATARACT EXTRACTION W/ INTRAOCULAR LENS  IMPLANT, BILATERAL     CHOLECYSTECTOMY  2001   COLECTOMY     lap sigmoid   COLONOSCOPY  2014   polyps found, 2 clamped off.   CORONARY ANGIOPLASTY  10/29/2007   Prox RCA & Mid Cx.   CORONARY ARTERY BYPASS GRAFT N/A 10/12/2013   Procedure: CORONARY ARTERY BYPASS GRAFT TIMES TWO;  Surgeon: [name], MD;  Location: [facility];  Service: Open Heart Surgery;  Laterality: N/A;   CORONARY/GRAFT ANGIOGRAPHY N/A 09/20/2017   Procedure: CORONARY/GRAFT ANGIOGRAPHY;  Surgeon: Sherren Mocha, MD;  Location: [facility];  Service: Cardiovascular;  Laterality: N/A;   LEFT HEART CATHETERIZATION WITH CORONARY ANGIOGRAM N/A 10/09/2013   Procedure: LEFT HEART CATHETERIZATION WITH CORONARY ANGIOGRAM;  Surgeon: [name], MD;  Location: [facility];  Service: Cardiovascular;  Laterality: N/A;   RIGHT/LEFT HEART CATH AND CORONARY ANGIOGRAPHY N/A 11/03/2020   Procedure: RIGHT/LEFT HEART CATH AND CORONARY ANGIOGRAPHY;  Surgeon: Corey Skains, MD;  Location: [employer];  Service: Cardiovascular;  Laterality: N/A;   STERNAL WIRES REMOVAL N/A 04/13/2014   Procedure: STERNAL WIRES REMOVAL;  Surgeon:  [name], MD;  Location: [facility];  Service: Thoracic;  Laterality: N/A;   TEE WITHOUT CARDIOVERSION N/A 11/03/2020   Procedure: TRANSESOPHAGEAL ECHOCARDIOGRAM (TEE);  Surgeon: Corey Skains, MD;  Location: [facility];  Service: Cardiovascular;  Laterality: N/A;   THYROIDECTOMY     TONSILLECTOMY     TUBAL LIGATION     VAGINAL DELIVERY     3   VISCERAL ARTERY INTERVENTION N/A 08/16/2016   Procedure: Visceral Artery Intervention;  Surgeon: Algernon Huxley, MD;  Location: [facility];  Service: Cardiovascular;  Laterality: N/A;   Family History:  Family History  Problem Relation Age of Onset   Breast cancer Mother 39   Hypertension Father    Mesothelioma Father    Asthma Father  Stroke Paternal Grandfather    Heart disease Other    Breast cancer Maternal Aunt    Breast cancer Paternal Aunt    Family Psychiatric  History: None reported Social History:  Social History   Substance and Sexual Activity  Alcohol Use No     Social History   Substance and Sexual Activity  Drug Use No    Social History   Socioeconomic History   Marital status: Married    Spouse name: Not on file   Number of children: 3   Years of education: Not on file   Highest education level: Not on file  Occupational History   Occupation: Retired    Fish farm manager: UNEMPLOYED    Comment: CNA  Tobacco Use   Smoking status: Former    Packs/day: 0.50    Years: 30.00    Pack years: 15.00    Types: Cigarettes    Quit date: 10/02/2013    Years since quitting: 7.3   Smokeless tobacco: Never  Vaping Use   Vaping Use: Never used  Substance and Sexual Activity   Alcohol use: No   Drug use: No   Sexual activity: Not Currently  Other Topics Concern   Not on file  Social History Narrative   Does not have Living Will   Desires CPR, would not want prolonged life support if futile.   Social Determinants of Health   Financial Resource Strain: Not on file  Food Insecurity: Not on file  Transportation  Needs: Not on file  Physical Activity: Not on file  Stress: Not on file  Social Connections: Not on file   Additional Social History:    Allergies:   Allergies  Allergen Reactions   Amitriptyline Other (See Comments)    Unknown reaction   Benadryl [Diphenhydramine] Shortness Of Breath   Demerol [Meperidine] Other (See Comments)    Unknown reaction   Gabapentin Other (See Comments)    Unknown reaction   Meperidine Hcl Other (See Comments)    Unknown reaction   Mirtazapine Other (See Comments)    Unknown reaction   Olanzapine Other (See Comments)    Unknown reaction    Voltaren [Diclofenac Sodium] Shortness Of Breath   Zetia [Ezetimibe] Other (See Comments)    Weakness in legs, shakiness all over   Ativan [Lorazepam] Other (See Comments)    Causes double vision at highter than .5 mg dose   Atorvastatin Other (See Comments)    Muscle aches and weakness   Budesonide-Formoterol Fumarate Other (See Comments)    Shakiness, tremors   Bupropion Hcl Other (See Comments)    "cloud over me" depression   Caffeine Other (See Comments)    jitters   Codeine Sulfate Other (See Comments)    Makes chest hurt like a heart attack   Lisinopril Cough   Metformin Nausea And Vomiting   Mometasone Furoate Nausea And Vomiting   Morphine Sulfate Other (See Comments)    Chest pain like a heart attack   Other Other (See Comments)    Beta Blockers, reaction shortness of breath   Oxycodone-Acetaminophen Nausea And Vomiting   Pioglitazone Other (See Comments)    Cannot take because of risk of bladder cancer   Propoxyphene N-Acetaminophen Nausea And Vomiting   Rosuvastatin Other (See Comments)    Muscle aches and weakness   Shellfish Allergy Diarrhea   Suvorexant Other (See Comments)    Jerking/nervous    Ticagrelor     Other reaction(s): Other (See Comments) "slowed heart rate" &  chest pain   Tramadol Nausea Only   Trazodone And Nefazodone Nausea And Vomiting   Venlafaxine Other (See  Comments)    Unknown reaction   Zolpidem Tartrate Other (See Comments)     Jittery, diarrhea   Latex Rash    Labs:  Results for orders placed or performed during the hospital encounter of 02/08/21 (from the past 48 hour(s))  CBC     Status: Abnormal   Collection Time: 02/07/21 11:06 PM  Result Value Ref Range   WBC 10.4 4.0 - 10.5 K/uL   RBC 3.07 (L) 3.87 - 5.11 MIL/uL   Hemoglobin 8.9 (L) 12.0 - 15.0 g/dL   HCT 26.6 (L) 36.0 - 46.0 %   MCV 86.6 80.0 - 100.0 fL   MCH 29.0 26.0 - 34.0 pg   MCHC 33.5 30.0 - 36.0 g/dL   RDW 13.6 11.5 - 15.5 %   Platelets 167 150 - 400 K/uL   nRBC 0.0 0.0 - 0.2 %    Comment: Performed at [facility], [address], [place], [place] [id number]  Basic metabolic panel     Status: Abnormal   Collection Time: 02/07/21 11:06 PM  Result Value Ref Range   Sodium 131 (L) 135 - 145 mmol/L   Potassium 5.3 (H) 3.5 - 5.1 mmol/L   Chloride 100 98 - 111 mmol/L   CO2 25 22 - 32 mmol/L   Glucose, Bld 233 (H) 70 - 99 mg/dL    Comment: Glucose reference range applies only to samples taken after fasting for at least 8 hours.   BUN 17 8 - 23 mg/dL   Creatinine, Ser 1.04 (H) 0.44 - 1.00 mg/dL   Calcium 8.0 (L) 8.9 - 10.3 mg/dL   GFR, Estimated 56 (L) >60 mL/min    Comment: (NOTE) Calculated using the CKD-EPI Creatinine Equation (2021)    Anion gap 6 5 - 15    Comment: Performed at [facility], [address] [place], [place] [address]  Troponin I (High Sensitivity)     Status: Abnormal   Collection Time: 02/07/21 11:06 PM  Result Value Ref Range   Troponin I (High Sensitivity) 100 (HH) <18 ng/L    Comment: CRITICAL RESULT CALLED TO, READ BACK BY AND VERIFIED WITH LISA THOMPSON '@[id number]'$  ON 02/07/21 SKL (NOTE) Elevated high sensitivity troponin I (hsTnI) values and significant  changes across serial measurements may suggest ACS but many other  chronic and acute conditions are known to elevate hsTnI results.  Refer to the "Links" section for  chest pain algorithms and additional  guidance. Performed at [facility], [address] [place], [place] [address]   Brain natriuretic peptide     Status: Abnormal   Collection Time: 02/07/21 11:06 PM  Result Value Ref Range   B Natriuretic Peptide 423.0 (H) 0.0 - 100.0 pg/mL    Comment: Performed at [facility], [place],  [address]  Troponin I (High Sensitivity)     Status: Abnormal   Collection Time: 02/08/21 12:34 AM  Result Value Ref Range   Troponin I (High Sensitivity) 113 (HH) <18 ng/L    Comment: CRITICAL VALUE NOTED. VALUE IS CONSISTENT WITH PREVIOUSLY REPORTED/CALLED VALUE SKL (NOTE) Elevated high sensitivity troponin I (hsTnI) values and significant  changes across serial measurements may suggest ACS but many other  chronic and acute conditions are known to elevate hsTnI results.  Refer to the "Links" section for chest pain algorithms and additional  guidance. Performed at  [facility], [address] [place], [place] [address]   Resp Panel by RT-PCR (Flu A&B, Covid) Nasopharyngeal Swab     Status: None   Collection Time: 02/08/21  2:00 AM   Specimen: Nasopharyngeal Swab; Nasopharyngeal(NP) swabs in vial transport medium  Result Value Ref Range   SARS Coronavirus 2 by RT PCR NEGATIVE NEGATIVE    Comment: (NOTE) SARS-CoV-2 target nucleic acids are NOT DETECTED.  The SARS-CoV-2 RNA is generally detectable in upper respiratory specimens during the acute phase of infection. The lowest concentration of SARS-CoV-2 viral copies this assay can detect is 138 copies/mL. A negative result does not preclude SARS-Cov-2 infection and should not be used as the sole basis for treatment or other patient management decisions. A negative result may occur with  improper specimen collection/handling, submission of specimen other than nasopharyngeal swab, presence of viral mutation(s) within the areas targeted by this assay, and  inadequate number of viral copies(<138 copies/mL). A negative result must be combined with clinical observations, patient history, and epidemiological information. The expected result is Negative.  Fact Sheet for Patients:  EntrepreneurPulse.com.au  Fact Sheet for Healthcare Providers:  IncredibleEmployment.be  This test is no t yet approved or cleared by the [place] FDA and  has been authorized for detection and/or diagnosis of SARS-CoV-2 by FDA under an Emergency Use Authorization (EUA). This EUA will remain  in effect (meaning this test can be used) for the duration of the COVID-19 declaration under Section 564(b)(1) of the Act, 21 U.S.C.section 360bbb-3(b)(1), unless the authorization is terminated  or revoked sooner.       Influenza A by PCR NEGATIVE NEGATIVE   Influenza B by PCR NEGATIVE NEGATIVE    Comment: (NOTE) The Xpert Xpress SARS-CoV-2/FLU/RSV plus assay is intended as an aid in the diagnosis of influenza from Nasopharyngeal swab specimens and should not be used as a sole basis for treatment. Nasal washings and aspirates are unacceptable for Xpert Xpress SARS-CoV-2/FLU/RSV testing.  Fact Sheet for Patients: EntrepreneurPulse.com.au  Fact Sheet for Healthcare Providers: IncredibleEmployment.be  This test is not yet approved or cleared by the [place] FDA and has been authorized for detection and/or diagnosis of SARS-CoV-2 by FDA under an Emergency Use Authorization (EUA). This EUA will remain in effect (meaning this test can be used) for the duration of the COVID-19 declaration under Section 564(b)(1) of the Act, 21 U.S.C. section 360bbb-3(b)(1), unless the authorization is terminated or revoked.  Performed at [facility], [address] [place], [place] [id number]   Basic metabolic panel     Status: Abnormal   Collection Time: 02/08/21  6:30 AM  Result Value Ref  Range   Sodium 131 (L) 135 - 145 mmol/L   Potassium 4.7 3.5 - 5.1 mmol/L   Chloride 96 (L) 98 - 111 mmol/L   CO2 25 22 - 32 mmol/L   Glucose, Bld 429 (H) 70 - 99 mg/dL    Comment: Glucose reference range applies only to samples taken after fasting for at least 8 hours.   BUN 20 8 - 23 mg/dL   Creatinine, Ser 1.22 (H) 0.44 - 1.00 mg/dL   Calcium 7.9 (L) 8.9 - 10.3 mg/dL   GFR, Estimated 46 (L) >60 mL/min    Comment: (NOTE) Calculated using the CKD-EPI Creatinine Equation (2021)    Anion gap 10 5 - 15    Comment: Performed at [facility], [address], [place], [place] [address]  CBC     Status: Abnormal  Collection Time: 02/08/21  6:30 AM  Result Value Ref Range   WBC 9.4 4.0 - 10.5 K/uL   RBC 3.18 (L) 3.87 - 5.11 MIL/uL   Hemoglobin 8.9 (L) 12.0 - 15.0 g/dL   HCT 27.4 (L) 36.0 - 46.0 %   MCV 86.2 80.0 - 100.0 fL   MCH 28.0 26.0 - 34.0 pg   MCHC 32.5 30.0 - 36.0 g/dL   RDW 13.4 11.5 - 15.5 %   Platelets 171 150 - 400 K/uL   nRBC 0.0 0.0 - 0.2 %    Comment: Performed at [facility], [address] [place], [place] [address]  Hemoglobin A1c     Status: Abnormal   Collection Time: 02/08/21  6:30 AM  Result Value Ref Range   Hgb A1c MFr Bld 8.1 (H) 4.8 - 5.6 %    Comment: (NOTE) Pre diabetes:          5.7%-6.4%  Diabetes:              >6.4%  Glycemic control for   <7.0% adults with diabetes    Mean Plasma Glucose 185.77 mg/dL    Comment: Performed at [facility], [address], [place], [place] [address]  Troponin I (High Sensitivity)     Status: Abnormal   Collection Time: 02/08/21  6:30 AM  Result Value Ref Range   Troponin I (High Sensitivity) 62 (H) <18 ng/L    Comment: (NOTE) Elevated high sensitivity troponin I (hsTnI) values and significant  changes across serial measurements may suggest ACS but many other  chronic and acute conditions are known to elevate hsTnI results.  Refer to the "Links" section for chest pain algorithms and  additional  guidance. Performed at [facility], [address] [place], [place] [address]   CBG monitoring, ED     Status: Abnormal   Collection Time: 02/08/21  7:43 AM  Result Value Ref Range   Glucose-Capillary 416 (H) 70 - 99 mg/dL    Comment: Glucose reference range applies only to samples taken after fasting for at least 8 hours.   Comment 1 Document in Chart   CBG monitoring, ED     Status: Abnormal   Collection Time: 02/08/21  8:20 AM  Result Value Ref Range   Glucose-Capillary 400 (H) 70 - 99 mg/dL    Comment: Glucose reference range applies only to samples taken after fasting for at least 8 hours.   Comment 1 Document in Chart   Glucose, capillary     Status: Abnormal   Collection Time: 02/08/21 12:51 PM  Result Value Ref Range   Glucose-Capillary 339 (H) 70 - 99 mg/dL    Comment: Glucose reference range applies only to samples taken after fasting for at least 8 hours.  Glucose, capillary     Status: Abnormal   Collection Time: 02/08/21  4:54 PM  Result Value Ref Range   Glucose-Capillary 391 (H) 70 - 99 mg/dL    Comment: Glucose reference range applies only to samples taken after fasting for at least 8 hours.   Comment 1 Notify RN    Comment 2 Document in Chart   Glucose, capillary     Status: Abnormal   Collection Time: 02/08/21  8:17 PM  Result Value Ref Range   Glucose-Capillary 551 (HH) 70 - 99 mg/dL    Comment: Glucose reference range applies only to samples taken after fasting for at least 8 hours.   Comment 1 Notify RN  Glucose, capillary     Status: Abnormal   Collection Time: 02/08/21  9:59 PM  Result Value Ref Range   Glucose-Capillary 504 (HH) 70 - 99 mg/dL    Comment: Glucose reference range applies only to samples taken after fasting for at least 8 hours.   Comment 1 Notify RN   Glucose, capillary     Status: Abnormal   Collection Time: 02/09/21 12:14 AM  Result Value Ref Range   Glucose-Capillary 456 (H) 70 - 99 mg/dL    Comment:  Glucose reference range applies only to samples taken after fasting for at least 8 hours.  CBC with Differential/Platelet     Status: Abnormal   Collection Time: 02/09/21  4:27 AM  Result Value Ref Range   WBC 14.4 (H) 4.0 - 10.5 K/uL   RBC 3.10 (L) 3.87 - 5.11 MIL/uL   Hemoglobin 9.0 (L) 12.0 - 15.0 g/dL   HCT 26.3 (L) 36.0 - 46.0 %   MCV 84.8 80.0 - 100.0 fL   MCH 29.0 26.0 - 34.0 pg   MCHC 34.2 30.0 - 36.0 g/dL   RDW 13.2 11.5 - 15.5 %   Platelets 196 150 - 400 K/uL   nRBC 0.0 0.0 - 0.2 %   Neutrophils Relative % 91 %   Neutro Abs 13.1 (H) 1.7 - 7.7 K/uL   Lymphocytes Relative 4 %   Lymphs Abs 0.6 (L) 0.7 - 4.0 K/uL   Monocytes Relative 4 %   Monocytes Absolute 0.6 0.1 - 1.0 K/uL   Eosinophils Relative 0 %   Eosinophils Absolute 0.0 0.0 - 0.5 K/uL   Basophils Relative 0 %   Basophils Absolute 0.0 0.0 - 0.1 K/uL   Immature Granulocytes 1 %   Abs Immature Granulocytes 0.09 (H) 0.00 - 0.07 K/uL    Comment: Performed at [facility], [address], [place], [place] [id number]  Basic metabolic panel     Status: Abnormal   Collection Time: 02/09/21  4:27 AM  Result Value Ref Range   Sodium 130 (L) 135 - 145 mmol/L   Potassium 4.5 3.5 - 5.1 mmol/L   Chloride 93 (L) 98 - 111 mmol/L   CO2 28 22 - 32 mmol/L   Glucose, Bld 381 (H) 70 - 99 mg/dL    Comment: Glucose reference range applies only to samples taken after fasting for at least 8 hours.   BUN 31 (H) 8 - 23 mg/dL   Creatinine, Ser 1.27 (H) 0.44 - 1.00 mg/dL   Calcium 7.9 (L) 8.9 - 10.3 mg/dL   GFR, Estimated 44 (L) >60 mL/min    Comment: (NOTE) Calculated using the CKD-EPI Creatinine Equation (2021)    Anion gap 9 5 - 15    Comment: Performed at [facility], [address] [place], [place] [address]  Magnesium     Status: None   Collection Time: 02/09/21  4:27 AM  Result Value Ref Range   Magnesium 1.8 1.7 - 2.4 mg/dL    Comment: Performed at [facility], [place], [place] [address]  Troponin I (High Sensitivity)     Status: Abnormal   Collection Time: 02/09/21  4:34 AM  Result Value Ref Range   Troponin I (High Sensitivity) 1,202 (HH) <18 ng/L    Comment: CRITICAL RESULT CALLED TO, READ BACK BY AND VERIFIED WITH BIANCA GRAVES RN [phone] 02/09/21 HNM (NOTE) Elevated high sensitivity troponin I (hsTnI) values and significant  changes across serial measurements may suggest  ACS but many other  chronic and acute conditions are known to elevate hsTnI results.  Refer to the "Links" section for chest pain algorithms and additional  guidance. Performed at [facility], [address] [place], [place] [address]   Glucose, capillary     Status: Abnormal   Collection Time: 02/09/21  7:40 AM  Result Value Ref Range   Glucose-Capillary 325 (H) 70 - 99 mg/dL    Comment: Glucose reference range applies only to samples taken after fasting for at least 8 hours.  Troponin I (High Sensitivity)     Status: Abnormal   Collection Time: 02/09/21  8:10 AM  Result Value Ref Range   Troponin I (High Sensitivity) 1,360 (HH) <18 ng/L    Comment: CRITICAL VALUE NOTED. VALUE IS CONSISTENT WITH PREVIOUSLY REPORTED/CALLED VALUE DAS (NOTE) Elevated high sensitivity troponin I (hsTnI) values and significant  changes across serial measurements may suggest ACS but many other  chronic and acute conditions are known to elevate hsTnI results.  Refer to the "Links" section for chest pain algorithms and additional  guidance. Performed at [facility], [address], [place], [place] [address]     Current Facility-Administered Medications  Medication Dose Route Frequency Provider Last Rate Last Admin   acetaminophen (TYLENOL) tablet 650 mg  650 mg Oral Q6H PRN Mansy, Jan A, MD   650 mg at 02/08/21 2123   Or   acetaminophen (TYLENOL) suppository 650 mg  650 mg Rectal Q6H PRN Mansy, Jan A, MD       ALPRAZolam Duanne Moron) tablet 0.5 mg  0.5 mg Oral TID Ottie Glazier, MD   0.5 mg at 02/09/21 0910   aspirin EC tablet 81 mg  81 mg Oral Daily Andrez Grime, MD   81 mg at 02/09/21 0911   clopidogrel (PLAVIX) tablet 75 mg  75 mg Oral Daily Andrez Grime, MD   75 mg at 02/09/21 0911   DULoxetine (CYMBALTA) DR capsule 20 mg  20 mg Oral q AM Mansy, Jan A, MD   20 mg at 02/09/21 [id number]   FLUoxetine (PROZAC) capsule 40 mg  40 mg Oral BID Mansy, Jan A, MD   40 mg at 02/09/21 0910   furosemide (LASIX) injection 40 mg  40 mg Intravenous BID Ottie Glazier, MD   40 mg at 02/09/21 0911   insulin aspart (novoLOG) injection 0-20 Units  0-20 Units Subcutaneous TID WC Sharion Settler, NP   15 Units at 02/09/21 0908   insulin aspart protamine- aspart (NOVOLOG MIX 70/30) injection 60 Units  60 Units Subcutaneous BID WC Mansy, Jan A, MD   60 Units at 02/09/21 0912   ipratropium-albuterol (DUONEB) 0.5-2.5 (3) MG/3ML nebulizer solution 3 mL  3 mL Nebulization BID Aline August, MD       levothyroxine (SYNTHROID) tablet 125 mcg  125 mcg Oral Q0600 Mansy, Jan A, MD   125 mcg at 02/09/21 [id number]   magnesium hydroxide (MILK OF MAGNESIA) suspension 30 mL  30 mL Oral Daily PRN Mansy, Jan A, MD       methylPREDNISolone (MEDROL DOSEPAK) tablet 4 mg  4 mg Oral PC lunch Ottie Glazier, MD       methylPREDNISolone (MEDROL DOSEPAK) tablet 4 mg  4 mg Oral PC supper Ottie Glazier, MD       Derrill Memo ON 02/10/2021] methylPREDNISolone (MEDROL DOSEPAK) tablet 4 mg  4 mg Oral 3 x daily with food Ottie Glazier, MD       [START ON 02/11/2021]  methylPREDNISolone (MEDROL DOSEPAK) tablet 4 mg  4 mg Oral 4X daily taper Ottie Glazier, MD       methylPREDNISolone (MEDROL DOSEPAK) tablet 8 mg  8 mg Oral Nightly Ottie Glazier, MD       [START ON 02/10/2021] methylPREDNISolone (MEDROL DOSEPAK) tablet 8 mg  8 mg Oral Nightly Ottie Glazier, MD       ondansetron (ZOFRAN) injection 4 mg  4 mg Intravenous Q6H PRN Mansy, Jan A, MD       pantoprazole (PROTONIX) EC tablet 40 mg  40 mg Oral Daily  Ottie Glazier, MD   40 mg at 02/09/21 0910   potassium chloride (KLOR-CON) CR tablet 10 mEq  10 mEq Oral Daily Mansy, Jan A, MD   10 mEq at 02/09/21 0910    Musculoskeletal: Strength & Muscle Tone: within normal limits Gait & Station: normal Patient leans: N/A            Psychiatric Specialty Exam:  Presentation  General Appearance:  No data recorded Eye Contact: No data recorded Speech: No data recorded Speech Volume: No data recorded Handedness: No data recorded  Mood and Affect  Mood: No data recorded Affect: No data recorded  Thought Process  Thought Processes: No data recorded Descriptions of Associations:No data recorded Orientation:No data recorded Thought Content:No data recorded History of Schizophrenia/Schizoaffective disorder:No data recorded Duration of Psychotic Symptoms:No data recorded Hallucinations:No data recorded Ideas of Reference:No data recorded Suicidal Thoughts:No data recorded Homicidal Thoughts:No data recorded  Sensorium  Memory: No data recorded Judgment: No data recorded Insight: No data recorded  Executive Functions  Concentration: No data recorded Attention Span: No data recorded Recall: No data recorded Fund of Knowledge: No data recorded Language: No data recorded  Psychomotor Activity  Psychomotor Activity: No data recorded  Assets  Assets: No data recorded  Sleep  Sleep: No data recorded  Physical Exam: Physical Exam Vitals and nursing note reviewed.  Constitutional:      Appearance: Normal appearance.  HENT:     Head: Normocephalic and atraumatic.     Mouth/Throat:     Pharynx: Oropharynx is clear.  Eyes:     Pupils: Pupils are equal, round, and reactive to light.  Cardiovascular:     Rate and Rhythm: Normal rate and regular rhythm.  Pulmonary:     Effort: Pulmonary effort is normal.     Breath sounds: Normal breath sounds.  Abdominal:     General: Abdomen is flat.      Palpations: Abdomen is soft.  Musculoskeletal:        General: Normal range of motion.  Skin:    General: Skin is warm and dry.  Neurological:     General: No focal deficit present.     Mental Status: She is alert. Mental status is at baseline.  Psychiatric:        Attention and Perception: Attention normal.        Mood and Affect: Mood is anxious.        Speech: Speech normal.        Behavior: Behavior is cooperative.        Thought Content: Thought content normal.        Cognition and Memory: Cognition normal.        Judgment: Judgment normal.   Review of Systems  Constitutional: Negative.   HENT: Negative.    Eyes: Negative.   Respiratory: Negative.    Cardiovascular: Negative.   Gastrointestinal: Negative.   Musculoskeletal: Negative.   Skin: Negative.  Neurological: Negative.   Psychiatric/Behavioral:  Negative for hallucinations, substance abuse and suicidal ideas. The patient is nervous/anxious and has insomnia.   Blood pressure (!) 149/80, pulse 74, temperature 98.4 F (36.9 C), resp. rate 17, height '5\' 6"'$  (1.676 m), weight 97.1 kg, SpO2 100 %. Body mass index is 34.55 kg/m.  Treatment Plan Summary: Plan patient is not currently expressing symptoms of major depression.  No evidence of dangerousness.  No psychosis.  Patient has longstanding chronic anxiety.  Hospitalization contributing to worsening.  Patient is also on steroids now for her COPD which undoubtedly make her anxiety worse.  She had already been started on Xanax 0.5 mg 3 times a day.  Barely noticed any change with it although it may be helping.  At this point I do not think that we need to make any further changes to her medicine.  I do not think it would be a good idea to continue the alprazolam at discharge.  She has appropriate outpatient treatment in place.  Would not add anything else at this point.  I will follow as needed.  Disposition: No evidence of imminent risk to self or others at present.    Patient does not meet criteria for psychiatric inpatient admission. Supportive therapy provided about ongoing stressors. Discussed crisis plan, support from social network, calling 911, coming to the Emergency Department, and calling Suicide Hotline.  Alethia Berthold, MD 02/09/2021 11:38 AM

## 2021-02-09 NOTE — Plan of Care (Signed)

## 2021-02-09 NOTE — Consult Note (Signed)
[facility] Cardiology  CARDIOLOGY CONSULT NOTE  Patient ID: [name] MRN: [id number] DOB/AGE: 07-May-1946 75 y.o.  Admit date: 02/08/2021 Referring Physician Eugenie Norrie Primary Physician Rusty Aus, MD Primary Cardiologist Nehemiah Massed Reason for Consultation GI bleed, blood thinners  HPI:  [name] is a 75 year old female with history of coronary artery disease (CABG and AVR 2015, balloon angioplasty to proximal circumflex in 2017, DES to mid left circumflex and SVG to RCA in 11/14/2020), type 2 diabetes, hypertension, hyperlipidemia, prior GI bleeding following polypectomy in 2014 who was admitted with shortness of breath.  Interval history - HS troponin checked this morning for no clear indication and was significantly elevated compared to prior at 1200. EKG unremarkable.  - This morning she has no complaints. She has a cough, but denies chest pain. She says that her breathing is at her baseline except for the coughing.   Review of systems complete and found to be negative unless listed above     Past Medical History:  Diagnosis Date   1st degree AV block    ACE-inhibitor cough    Allergic rhinitis    Anemia    iron deficiency anemia   Anxiety    Aortic ectasia (HCC)    a. CT abd in 12/2016 incidentally noted aortic atherosclerosis and infrarenal abdominal aortic ectasia measuring as large as 2.7 cm with recommendation to repeat US in 2023.   Arthritis    Asthma    Cataract    Chronic depression    Chronic diastolic CHF (congestive heart failure) (HCC)    Chronic headache    COPD (chronic obstructive pulmonary disease) (HCC)    Coronary artery disease    a. DES to RCA and mid Cx 2009. b. CABG and bioprosthetic AVR May 2015. c. cutting balloon to prox Cx in 05/2016   Diabetes mellitus    type 2   Diverticulitis of colon    Essential hypertension    GERD (gastroesophageal reflux disease)    Hearing loss    History of blood transfusion 2013   History of prosthetic aortic  valve replacement    HOH (hard of hearing)    Hypercholesterolemia    intolerance of statins and niaspan   IDA (iron deficiency anemia) 02/03/2019   Mobitz type 1 second degree AV block    OSA (obstructive sleep apnea)    mild, intolerant of cpap   PAD (peripheral artery disease) ([place])    a. atherosclerosis by CT abd 12/2016 in [place].   PONV (postoperative nausea and vomiting)    Statin intolerance    Thyroid disease     Past Surgical History:  Procedure Laterality Date   ABDOMINAL HYSTERECTOMY     ABDOMINAL HYSTERECTOMY W/ PARTIAL VAGINACTOMY     AORTIC VALVE REPLACEMENT N/A 10/12/2013   Procedure: AORTIC VALVE REPLACEMENT (AVR);  Surgeon: [name], MD;  Location: [place];  Service: Open Heart Surgery;  Laterality: N/A;   APPENDECTOMY  1964   BARTHOLIN GLAND CYST EXCISION     BLADDER SUSPENSION     BREAST BIOPSY Bilateral 09/11/2000   neg   BREAST BIOPSY Left 07/24/2010   neg   BREAST CYST EXCISION  1988   bilateral nonmalignant tumors, x3   CARDIAC CATHETERIZATION     CARDIAC CATHETERIZATION N/A 05/25/2016   Procedure: Coronary Balloon Angioplasty;  Surgeon: Leonie Man, MD;  Location: [place] [facility];  Service: Cardiovascular;  Laterality: N/A;   CARDIAC CATHETERIZATION N/A 05/25/2016   Procedure: Coronary/Graft Angiography;  Surgeon: Leonie Man, MD;  Location: [place] [facility];  Service: Cardiovascular;  Laterality: N/A;   CATARACT EXTRACTION W/ INTRAOCULAR LENS  IMPLANT, BILATERAL     CHOLECYSTECTOMY  2001   COLECTOMY     lap sigmoid   COLONOSCOPY  2014   polyps found, 2 clamped off.   CORONARY ANGIOPLASTY  10/29/2007   Prox RCA & Mid Cx.   CORONARY ARTERY BYPASS GRAFT N/A 10/12/2013   Procedure: CORONARY ARTERY BYPASS GRAFT TIMES TWO;  Surgeon: [name], MD;  Location: [place] [facility];  Service: Open Heart Surgery;  Laterality: N/A;   CORONARY/GRAFT ANGIOGRAPHY N/A 09/20/2017   Procedure: CORONARY/GRAFT ANGIOGRAPHY;  Surgeon: Sherren Mocha, MD;  Location: [place] [facility];  Service: Cardiovascular;  Laterality: N/A;   LEFT HEART CATHETERIZATION WITH CORONARY ANGIOGRAM N/A 10/09/2013   Procedure: LEFT HEART CATHETERIZATION WITH CORONARY ANGIOGRAM;  Surgeon: [name], MD;  Location: [facility] C[facility];  Service: Cardiovascular;  Laterality: N/A;   RIGHT/LEFT HEART CATH AND CORONARY ANGIOGRAPHY N/A 11/03/2020   Procedure: RIGHT/LEFT HEART CATH AND CORONARY ANGIOGRAPHY;  Surgeon: Corey Skains, MD;  Location: [place] [facility];  Service: Cardiovascular;  Laterality: N/A;   STERNAL WIRES REMOVAL N/A 04/13/2014   Procedure: STERNAL WIRES REMOVAL;  Surgeon: [name], MD;  Location: [place];  Service: Thoracic;  Laterality: N/A;   TEE WITHOUT CARDIOVERSION N/A 11/03/2020   Procedure: TRANSESOPHAGEAL ECHOCARDIOGRAM (TEE);  Surgeon: Corey Skains, MD;  Location: [facility];  Service: Cardiovascular;  Laterality: N/A;   THYROIDECTOMY     TONSILLECTOMY     TUBAL LIGATION     VAGINAL DELIVERY     3   VISCERAL ARTERY INTERVENTION N/A 08/16/2016   Procedure: Visceral Artery Intervention;  Surgeon: Algernon Huxley, MD;  Location: [place] [facility];  Service: Cardiovascular;  Laterality: N/A;    Medications Prior to Admission  Medication Sig Dispense Refill Last Dose   ANORO ELLIPTA 62.5-25 MCG/INH AEPB Inhale 1 puff into the lungs daily.   unknown at unknown   aspirin EC 81 MG EC tablet Take 1 tablet (81 mg total) by mouth daily. Swallow whole. 30 tablet 11 unknown at unknown   Calcium-Vitamin D (CALTRATE 600 PLUS-VIT D PO) Take 1 tablet by mouth 2 (two) times daily.   unknown at unknown   clopidogrel (PLAVIX) 75 MG tablet Take 75 mg by mouth daily.   unknown at unknown   DULoxetine (CYMBALTA) 20 MG capsule Take 1 capsule (20 mg total) by mouth in the morning.   unknown at unknown   FLUoxetine (PROZAC) 40 MG capsule Take 40 mg by mouth 2 (two) times daily.   unknown at unknown   hydrOXYzine (ATARAX/VISTARIL) 25 MG tablet Take 1 tablet (25 mg  total) by mouth 3 (three) times daily. 90 tablet 0 unknown at unknown   insulin lispro (HUMALOG) 100 UNIT/ML injection Inject 0-54 Units into the skin 4 (four) times daily -  before meals and at bedtime.   unknown at unknown   insulin NPH-regular Human (NOVOLIN 70/30) (70-30) 100 UNIT/ML injection Inject 60 Units into the skin 2 (two) times daily with a meal. Inject 60u under the skin every morning at breakfast and inject 60u under the skin every evening with supper   unknown at unknown   isosorbide mononitrate (IMDUR) 30 MG 24 hr tablet Take 1 tablet (30 mg total) by mouth daily. 30 tablet 11 unknown at unknown   levothyroxine (SYNTHROID, LEVOTHROID) 125 MCG tablet Take  125 mcg by mouth daily before breakfast.   unknown at unknown   losartan (COZAAR) 25 MG tablet Take 1 tablet (25 mg total) by mouth in the morning.   unknown at unknown   pantoprazole (PROTONIX) 40 MG tablet Take 40 mg by mouth in the morning.   unknown at unknown   potassium chloride (KLOR-CON) 10 MEQ tablet Take 1 tablet (10 mEq total) by mouth daily. 30 tablet 0    torsemide (DEMADEX) 20 MG tablet Take 20 mg by mouth daily.   unknown at unknown   traZODone (DESYREL) 100 MG tablet Take 200 mg by mouth at bedtime.   unknown at unknown   acetaminophen (TYLENOL) 500 MG tablet Take 500-1,000 mg by mouth every 6 (six) hours as needed (pain).   prn at prn   albuterol (PROVENTIL) (2.5 MG/3ML) 0.083% nebulizer solution Take 2.5 mg by nebulization every 4 (four) hours as needed for shortness of breath or wheezing.   prn at prn   insulin glargine (LANTUS) 100 UNIT/ML injection Inject 100 Units into the skin at bedtime. (Patient not taking: Reported on 02/08/2021)   Not Taking at unknown   nystatin (MYCOSTATIN) 100000 UNIT/ML suspension Take by mouth.   prn at prn   promethazine (PHENERGAN) 25 MG tablet Take 25 mg by mouth every 6 (six) hours as needed for nausea or vomiting.   prn at prn   Social History   Socioeconomic History   Marital  status: Married    Spouse name: Not on file   Number of children: 3   Years of education: Not on file   Highest education level: Not on file  Occupational History   Occupation: Retired    Fish farm manager: UNEMPLOYED    Comment: CNA  Tobacco Use   Smoking status: Former    Packs/day: 0.50    Years: 30.00    Pack years: 15.00    Types: Cigarettes    Quit date: 10/02/2013    Years since quitting: 7.3   Smokeless tobacco: Never  Vaping Use   Vaping Use: Never used  Substance and Sexual Activity   Alcohol use: No   Drug use: No   Sexual activity: Not Currently  Other Topics Concern   Not on file  Social History Narrative   Does not have Living Will   Desires CPR, would not want prolonged life support if futile.   Social Determinants of Health   Financial Resource Strain: Not on file  Food Insecurity: Not on file  Transportation Needs: Not on file  Physical Activity: Not on file  Stress: Not on file  Social Connections: Not on file  Intimate Partner Violence: Not on file    Family History  Problem Relation Age of Onset   Breast cancer Mother 83   Hypertension Father    Mesothelioma Father    Asthma Father    Stroke Paternal Grandfather    Heart disease Other    Breast cancer Maternal Aunt    Breast cancer Paternal Aunt       Review of systems complete and found to be negative unless listed above    PHYSICAL EXAM  General: Well developed, well nourished, appears anxious.  HEENT:  Normocephalic and atramatic Neck:  No JVD.  Lungs: Clear bilaterally to auscultation and percussion. Heart: HRRR . [id number] systolic murmur; no crisp closure sound.  Abdomen: Bowel sounds are positive, abdomen soft and non-tender  Msk:  Back normal, normal gait. Normal strength and tone for age. Extremities: No clubbing,  cyanosis or edema.   Neuro: Alert and oriented X 3. Psych:  Good affect, responds appropriately  Labs:   Lab Results  Component Value Date   WBC 14.4 (H) 02/09/2021    HGB 9.0 (L) 02/09/2021   HCT 26.3 (L) 02/09/2021   MCV 84.8 02/09/2021   PLT 196 02/09/2021    Recent Labs  Lab 02/09/21 0427  NA 130*  K 4.5  CL 93*  CO2 28  BUN 31*  CREATININE 1.27*  CALCIUM 7.9*  GLUCOSE 381*    Lab Results  Component Value Date   CKTOTAL 101 05/26/2016   CKMB 8.6 (H) 05/26/2016   TROPONINI <0.03 03/04/2018    Lab Results  Component Value Date   CHOL 173 09/20/2017   CHOL 206 (H) 03/16/2015   CHOL 220 (H) 07/07/2014   Lab Results  Component Value Date   HDL 22 (L) 09/20/2017   HDL 21 (L) 03/16/2015   HDL 30.20 (L) 07/07/2014   Lab Results  Component Value Date   LDLCALC 110 (H) 09/20/2017   LDLCALC 111 03/16/2015   LDLCALC 135 (H) 10/03/2013   Lab Results  Component Value Date   TRIG 207 (H) 09/20/2017   TRIG 369 (H) 03/16/2015   TRIG 329.0 (H) 07/07/2014   Lab Results  Component Value Date   CHOLHDL 7.9 09/20/2017   CHOLHDL 9.8 (H) 03/16/2015   CHOLHDL 7 07/07/2014   Lab Results  Component Value Date   LDLDIRECT 127.0 07/07/2014   LDLDIRECT 172.6 12/24/2008   LDLDIRECT 100.8 11/11/2007      Radiology: DG Chest 2 View  Result Date: 02/07/2021 CLINICAL DATA:  Shortness of breath.  Chest pain. EXAM: CHEST - 2 VIEW COMPARISON:  Most recent radiograph and CT 02/01/2021. FINDINGS: Stable prominent cardiac contours post aortic valve replacement and median sternotomy. Unchanged mediastinal contours. Chronic coarse lung markings. No confluent airspace disease, pleural effusion, or pneumothorax. Ground-glass airspace disease on prior CT are not well seen by radiograph. IMPRESSION: No acute chest finding.  Chronic coarse lung markings. Electronically Signed   By: Keith Rake M.D.   On: 02/07/2021 22:47   CT CHEST WO CONTRAST  Result Date: 02/01/2021 CLINICAL DATA:  COPD exacerbation shob and chest pain that started this arm. EXAM: CT CHEST WITHOUT CONTRAST TECHNIQUE: Multidetector CT imaging of the chest was performed following the  standard protocol without IV contrast. COMPARISON:  CT angio chest 08/08/2020, CT angiography chest 06/15/2020 FINDINGS: Cardiovascular: Normal heart size. Status post aortic valve replacement. No significant pericardial effusion. The thoracic aorta is normal in caliber. At least moderate atherosclerotic plaque of the thoracic aorta. Four-vessel coronary artery calcifications status post coronary artery stents. The main pulmonary artery is enlarged in caliber measuring up to 3.5 cm. Mediastinum/Nodes: No gross hilar adenopathy, noting limited sensitivity for the detection of hilar adenopathy on this noncontrast study. No enlarged mediastinal or axillary lymph nodes. Nonspecific debris noted within the esophagus. The thyroid and trachea demonstrate no significant findings. Lungs/Pleura: No focal consolidation. Interval decrease in almost resolved patchy ground-glass airspace opacities with a persistent peribronchovascular nodular-like 1 cm right upper lobe lesion (3:54, 5:78). Interval development of a 1.3 cm ground-glass airspace opacity at the right apex (3:17, 5:104). Trace persistent interlobular septal wall thickening. No pulmonary mass. No pleural effusion. No pneumothorax. Upper Abdomen: Status post cholecystectomy.  No acute abnormality. Musculoskeletal: No chest wall abnormality. No suspicious lytic or blastic osseous lesions. No acute displaced fracture. Sternotomy wires are noted. IMPRESSION: 1. Interval development of a 1.3  cm ground-glass airspace opacity at the right apex. Persistent peribronchovascular nodular like 1 cm right upper lobe ground-glass airspace opacity. Adenocarcinoma cannot be excluded. Follow up by CT is recommended in 12 months, with continued annual surveillance for a minimum of 3 years. These recommendations are taken from: Recommendations for the Management of Subsolid Pulmonary Nodules Detected at CT: A Statement from the [facility] 2013; 266:1, 304-317. 2.  Enlarged main pulmonary artery suggestive of hypertension. 3. Debris noted within the esophagus. 4. Aortic Atherosclerosis (ICD10-I70.0) including four-vessel coronary artery calcifications status post stent. 5. Status aortic valve replacement. Electronically Signed   By: Iven Finn M.D.   On: 02/01/2021 17:29   CT Angio Chest PE W and/or Wo Contrast  Result Date: 02/08/2021 CLINICAL DATA:  PE suspected, high prob. Dyspnea. Nonproductive cough. EXAM: CT ANGIOGRAPHY CHEST WITH CONTRAST TECHNIQUE: Multidetector CT imaging of the chest was performed using the standard protocol during bolus administration of intravenous contrast. Multiplanar CT image reconstructions and MIPs were obtained to evaluate the vascular anatomy. CONTRAST:  15m OMNIPAQUE IOHEXOL 350 MG/ML SOLN COMPARISON:  02/01/2021 FINDINGS: Cardiovascular: There is adequate opacification of the pulmonary arterial tree. No intraluminal filling defect through the segmental level to suggest acute pulmonary embolism. The central pulmonary arteries are enlarged in keeping with changes of pulmonary arterial hypertension. Extensive multi-vessel coronary artery calcification is noted. Probable stenting of the proximal right and left circumflex coronary arteries. Coronary artery bypass grafting and aortic valve replacement has been performed. Mild global cardiomegaly is present. Moderate largely noncalcified atheromatous plaque seen throughout the aorta. No aortic aneurysm. Mediastinum/Nodes: No pathologic thoracic adenopathy. The esophagus is unremarkable. Lungs/Pleura: Imaging is slightly limited by motion artifact. There is bilateral asymmetric patchy ground-glass pulmonary infiltrate more focal within the mid lung zones bilaterally possibly infectious or inflammatory in nature as can be seen with COVID-19 pneumonia. This has progressed since prior examination. No pneumothorax or pleural effusion. Central airways are widely patent. Upper Abdomen:  Cholecystectomy has been performed. No acute abnormality. Musculoskeletal: No acute bone abnormality. No lytic or blastic bone lesion. Review of the MIP images confirms the above findings. IMPRESSION: No pulmonary embolism. Mild cardiomegaly. Morphologic changes in keeping with pulmonary arterial hypertension. Progressive patchy bilateral mid lung zone ground-glass pulmonary infiltrate, possibly infectious or inflammatory in nature. Serologic correlation for COVID-19 pneumonia may be helpful. Aortic Atherosclerosis (ICD10-I70.0). Electronically Signed   By: AFidela SalisburyM.D.   On: 02/08/2021 02:55   NM Pulmonary Perfusion  Result Date: 01/30/2021 CLINICAL DATA:  Shortness of breath EXAM: NUCLEAR MEDICINE PERFUSION LUNG SCAN TECHNIQUE: Perfusion images were obtained in multiple projections after intravenous injection of radiopharmaceutical. Ventilation scans intentionally deferred if perfusion scan and chest x-ray adequate for interpretation during COVID 19 epidemic. RADIOPHARMACEUTICALS:  4 mCi Tc-946mAA IV COMPARISON:  01/28/2021 FINDINGS: There is adequate uptake of radioactive tracer throughout both lungs. No focal defect to suggest pulmonary embolism is noted. Cardiac shadow is enlarged. IMPRESSION: No focal defect to suggest pulmonary embolism. Electronically Signed   By: MaInez Catalina.D.   On: 01/30/2021 16:07   DG Chest Port 1 View  Result Date: 02/01/2021 CLINICAL DATA:  [age]ear old female with history of shortness of breath. COPD. EXAM: PORTABLE CHEST 1 VIEW COMPARISON:  Chest x-ray 01/30/2021. FINDINGS: Lung volumes are normal. No consolidative airspace disease. No pleural effusions. Diffuse interstitial prominence and widespread peribronchial cuffing. No pneumothorax. No definite suspicious appearing pulmonary nodules or masses are noted. No evidence of pulmonary edema. Mild cardiomegaly. Upper mediastinal contours are  within normal limits. Status post hemi median sternotomy for aortic valve  replacement with what appears to be a stented bioprosthesis. IMPRESSION: 1. Diffuse interstitial prominence and peribronchial cuffing similar to prior studies suggestive of probable chronic bronchitis. 2. Cardiomegaly. Electronically Signed   By: Vinnie Langton M.D.   On: 02/01/2021 10:49   DG Chest Port 1 View  Result Date: 01/30/2021 CLINICAL DATA:  Post VQ scan EXAM: PORTABLE CHEST 1 VIEW COMPARISON:  01/28/2021, 11/24/2020, 03/04/2018 FINDINGS: Post sternotomy changes with valve replacement. Cardiomegaly with mild vascular congestion. Diffuse interstitial opacities some of which is felt due to chronic disease. No pleural effusion or pneumothorax. IMPRESSION: 1. Cardiomegaly with slight central congestion and chronic interstitial opacity. Electronically Signed   By: Donavan Foil M.D.   On: 01/30/2021 16:34   DG Chest Port 1 View  Result Date: 01/28/2021 CLINICAL DATA:  Difficulty breathing.  Shortness of breath. EXAM: PORTABLE CHEST 1 VIEW COMPARISON:  November 24, 2020 FINDINGS: Stable cardiomegaly. The hila and mediastinum are normal. No pneumothorax. No nodules or masses. No focal infiltrates. Increased interstitial opacities in the lungs suggesting mild edema. IMPRESSION: Cardiomegaly and mild pulmonary edema. No other acute abnormalities. Electronically Signed   By: Dorise Bullion III M.D.   On: 01/28/2021 19:17   ECHOCARDIOGRAM COMPLETE  Result Date: 02/02/2021    ECHOCARDIOGRAM REPORT   Patient Name:   AVELYN BLUNCK Date of Exam: 02/02/2021 Medical Rec #:  [id number]     Height:       66.0 in Accession #:    [id number]    Weight:       203.0 lb Date of Birth:  December 29, 1945      BSA:          2.012 m Patient Age:    42 years      BP:           144/61 mmHg Patient Gender: F             HR:           74 bpm. Exam Location:  [facility] Procedure: 2D Echo, Color Doppler and Cardiac Doppler Indications:     Chest pain R07.9  History:         Patient has prior history of Echocardiogram examinations, most                   recent 11/25/2020. Prior CABG, COPD; Risk Factors:Hypertension.  Sonographer:     Sherrie Sport Referring Phys:  [id number] SARA-MAIZ A THOMAS Diagnosing Phys: Serafina Royals MD  Sonographer Comments: Technically challenging study due to limited acoustic windows, suboptimal parasternal window and no apical window. Image acquisition challenging due to COPD. IMPRESSIONS  1. Left ventricular ejection fraction, by estimation, is 55 to 60%. The left ventricle has normal function. The left ventricle has no regional wall motion abnormalities. Left ventricular diastolic parameters were normal.  2. Right ventricular systolic function is normal. The right ventricular size is normal.  3. The mitral valve is normal in structure. Mild mitral valve regurgitation. Moderate mitral annular calcification.  4. The aortic valve is normal in structure. Aortic valve regurgitation is not visualized. FINDINGS  Left Ventricle: Left ventricular ejection fraction, by estimation, is 55 to 60%. The left ventricle has normal function. The left ventricle has no regional wall motion abnormalities. The left ventricular internal cavity size was normal in size. There is  no left ventricular hypertrophy. Left ventricular diastolic parameters were normal. Right Ventricle: The right ventricular size is  normal. No increase in right ventricular wall thickness. Right ventricular systolic function is normal. Left Atrium: Left atrial size was normal in size. Right Atrium: Right atrial size was normal in size. Pericardium: There is no evidence of pericardial effusion. Mitral Valve: The mitral valve is normal in structure. Moderate mitral annular calcification. Mild mitral valve regurgitation. Tricuspid Valve: The tricuspid valve is normal in structure. Tricuspid valve regurgitation is mild. Aortic Valve: The aortic valve is normal in structure. Aortic valve regurgitation is not visualized. Pulmonic Valve: The pulmonic valve was normal in structure. Pulmonic  valve regurgitation is not visualized. Aorta: The aortic root and ascending aorta are structurally normal, with no evidence of dilitation. IAS/Shunts: No atrial level shunt detected by color flow Doppler.  LEFT VENTRICLE PLAX 2D LVIDd:         4.40 cm LVIDs:         3.10 cm LV PW:         1.50 cm LV IVS:        1.80 cm LVOT diam:     2.10 cm LVOT Area:     3.46 cm  LEFT ATRIUM         Index LA diam:    4.70 cm 2.34 cm/m                        PULMONIC VALVE AORTA                 PV Vmax:        0.84 m/s Ao Root diam: 3.10 cm PV Peak grad:   2.8 mmHg                       RVOT Peak grad: 5 mmHg   SHUNTS Systemic Diam: 2.10 cm Serafina Royals MD Electronically signed by Serafina Royals MD Signature Date/Time: 02/02/2021/4:41:25 PM    Final     EKG: Sinus rhythm with LVH.  Lateral ST changes.  Cath 11/14/20- Three vessel coronary artery disease.  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  # CAD s/p PCI to SVG and Lcx 11/14/20 # Elevated troponin The patient is status post CABG  and AVR in 2015 and recently underwent stent placement to an SVG and left circumflex in June 2022.  She is not currently having any anginal symptoms.  Hgb is decreased slightly from 10.9-8.9 without any overt signs of bleeding.  Given the risk of stent thrombosis in this interval after recent PCI, would recommend resuming dual antiplatelet therapy.  This morning her troponin was checked without any symptoms and was significantly elevated compared to prior. EKG unchanged. Given her lack of symptoms of chest pain or shorntess of breath, I think this is related to demand from COPD exacerbation and mild volume overload from severe AS. Will need to monitor closely.  -Plavix and aspirin resumed -reasonable to discharge the patient on a p.o. PPI - Troponin needs to be trended until peak and closely monitored for chest pain.  - Please repeat EKG now to assure no ongoing changes.   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  She is also being treated for COPD exacerbation.  Overall I feel that her symptoms are multifactorial, but it is difficult to eliminate the possibility of worsening aortic valve stenosis. -Agree with lasix 40 mg IV BID -Continue losartan -Continue COPD treatment per the pulmonary team. -At discharge, the patient needs close follow-up with Dr. Nehemiah Massed in 1 week.  # Anemia In the presence of aortic valve stenosis could consider hemolysis and/or GI bleeding from AVMs slowly contributing to her anemia. -We will consider sending hemolysis labs.  Signed: Andrez Grime MD 02/09/2021, 7:31 AM

## 2021-02-09 NOTE — Progress Notes (Signed)
Pt becoming very anxious toward end of shift. Began Coughing up small amounts of blood tinged sputum which caused further anxiety. MD made aware at 1830. No response as of this time. Night nurse Wyatt Portela made aware and she will contact the night MD.

## 2021-02-09 NOTE — Progress Notes (Signed)
Pulmonary Medicine          Date: 02/09/2021,   MRN# [id number] [name] [birth year]     AdmissionWeight: 90.7 kg                 CurrentWeight: 97.1 kg   Referring physician: Dr Remi Haggard   CHIEF COMPLAINT:   Respiratory distress with Acute exacerbation of COPD   HISTORY OF PRESENT ILLNESS   75 yo with hx of CABG 2015 , depression,anxiety, hypertension, OSA, COPD, iron deficiency anemia, aortic dilatation, presents emergency department for cc of dyspnea and sob. She is chronically hypoxemic with 3L/min [place] requirement. At bedside, she is no longer tripoding and resting in ED bed and does not appear to be in acute distress. She fully participated with physical exam. Patient presented to the ER with acute onset of worsening dyspnea today with associated chest tightness that is diffuse without radiation and associated nonproductive cough with dyspnea he was recently admitted here and had a negative VQ scan.  She denied any leg pain or worsening edema or recent travels or surgeries.  No melena or bright red bleeding per rectum.  No dysuria, oliguria or hematuria or flank pain.She is able to tell me her full name, DOB, location of hospital, and the current calendar year.She denies recent fever, nausea, vomiting, abdominal pain, dysuria, diarrhea, hematuria, blood in her stool, syncope, lost of consciousness. She reports chest pain every time she coughs. She reports no new cough. She is unsure if she has had weight gain or lost. She denies flu like illness. She denies LE edema. PCCM consult placed for further evaluation and management. Patient has Sjogrens disease her CT chest shows inflammatory changes and pulmonary edema. She is asking to go home but I explained this premature and she agrees to stay.   02/09/21- patient is improved, overnight 800cc diuresis.  Steroids reduced to PO tapering medrol dose pack. Psychiatry consultation to review anxiety/panic attack component to her  recurrent respiratory distress. Appreciate collaboration with everyone involved in her care.    PAST MEDICAL HISTORY   Past Medical History:  Diagnosis Date  . 1st degree AV block   . ACE-inhibitor cough   . Allergic rhinitis   . Anemia    iron deficiency anemia  . Anxiety   . Aortic ectasia (HCC)    a. CT abd in 12/2016 incidentally noted aortic atherosclerosis and infrarenal abdominal aortic ectasia measuring as large as 2.7 cm with recommendation to repeat US in 2023.  Marland Kitchen Arthritis   . Asthma   . Cataract   . Chronic depression   . Chronic diastolic CHF (congestive heart failure) ([place])   . Chronic headache   . COPD (chronic obstructive pulmonary disease) ([place])   . Coronary artery disease    a. DES to RCA and mid Cx 2009. b. CABG and bioprosthetic AVR May 2015. c. cutting balloon to prox Cx in 05/2016  . Diabetes mellitus    type 2  . Diverticulitis of colon   . Essential hypertension   . GERD (gastroesophageal reflux disease)   . Hearing loss   . History of blood transfusion 2013  . History of prosthetic aortic valve replacement   . HOH (hard of hearing)   . Hypercholesterolemia    intolerance of statins and niaspan  . IDA (iron deficiency anemia) 02/03/2019  . Mobitz type 1 second degree AV block   . OSA (obstructive sleep apnea)    mild, intolerant of cpap  .  PAD (peripheral artery disease) ([place])    a. atherosclerosis by CT abd 12/2016 in [place].  Marland Kitchen PONV (postoperative nausea and vomiting)   . Statin intolerance   . Thyroid disease      SURGICAL HISTORY   Past Surgical History:  Procedure Laterality Date  . ABDOMINAL HYSTERECTOMY    . ABDOMINAL HYSTERECTOMY W/ PARTIAL VAGINACTOMY    . AORTIC VALVE REPLACEMENT N/A 10/12/2013   Procedure: AORTIC VALVE REPLACEMENT (AVR);  Surgeon: [name], MD;  Location: [place];  Service: Open Heart Surgery;  Laterality: N/A;  . [place]  . BARTHOLIN GLAND CYST EXCISION    . BLADDER SUSPENSION    . BREAST BIOPSY  Bilateral 09/11/2000   neg  . BREAST BIOPSY Left 07/24/2010   neg  . BREAST CYST EXCISION  1988   bilateral nonmalignant tumors, x3  . CARDIAC CATHETERIZATION    . CARDIAC CATHETERIZATION N/A 05/25/2016   Procedure: Coronary Balloon Angioplasty;  Surgeon: Leonie Man, MD;  Location: [place] [facility];  Service: Cardiovascular;  Laterality: N/A;  . CARDIAC CATHETERIZATION N/A 05/25/2016   Procedure: Coronary/Graft Angiography;  Surgeon: Leonie Man, MD;  Location: [place] [facility];  Service: Cardiovascular;  Laterality: N/A;  . CATARACT EXTRACTION W/ INTRAOCULAR LENS  IMPLANT, BILATERAL    . CHOLECYSTECTOMY  2001  . COLECTOMY     lap sigmoid  . COLONOSCOPY  2014   polyps found, 2 clamped off.  . CORONARY ANGIOPLASTY  10/29/2007   Prox RCA & Mid Cx.  . CORONARY ARTERY BYPASS GRAFT N/A 10/12/2013   Procedure: CORONARY ARTERY BYPASS GRAFT TIMES TWO;  Surgeon: [name], MD;  Location: [facility] [facility];  Service: Open Heart Surgery;  Laterality: N/A;  . CORONARY/GRAFT ANGIOGRAPHY N/A 09/20/2017   Procedure: CORONARY/GRAFT ANGIOGRAPHY;  Surgeon: Sherren Mocha, MD;  Location: [place] [facility];  Service: Cardiovascular;  Laterality: N/A;  . LEFT HEART CATHETERIZATION WITH CORONARY ANGIOGRAM N/A 10/09/2013   Procedure: LEFT HEART CATHETERIZATION WITH CORONARY ANGIOGRAM;  Surgeon: [name], MD;  Location: [facility];  Service: Cardiovascular;  Laterality: N/A;  . RIGHT/LEFT HEART CATH AND CORONARY ANGIOGRAPHY N/A 11/03/2020   Procedure: RIGHT/LEFT HEART CATH AND CORONARY ANGIOGRAPHY;  Surgeon: Corey Skains, MD;  Location: [place] [facility];  Service: Cardiovascular;  Laterality: N/A;  . STERNAL WIRES REMOVAL N/A 04/13/2014   Procedure: STERNAL WIRES REMOVAL;  Surgeon: [name], MD;  Location: [place];  Service: Thoracic;  Laterality: N/A;  . TEE WITHOUT CARDIOVERSION N/A 11/03/2020   Procedure: TRANSESOPHAGEAL ECHOCARDIOGRAM (TEE);  Surgeon: Corey Skains, MD;   Location: [facility];  Service: Cardiovascular;  Laterality: N/A;  . THYROIDECTOMY    . TONSILLECTOMY    . TUBAL LIGATION    . VAGINAL DELIVERY     3  . VISCERAL ARTERY INTERVENTION N/A 08/16/2016   Procedure: Visceral Artery Intervention;  Surgeon: Algernon Huxley, MD;  Location: [place] [facility];  Service: Cardiovascular;  Laterality: N/A;     FAMILY HISTORY   Family History  Problem Relation Age of Onset  . Breast cancer Mother 30  . Hypertension Father   . Mesothelioma Father   . Asthma Father   . Stroke Paternal Grandfather   . Heart disease Other   . Breast cancer Maternal Aunt   . Breast cancer Paternal Aunt      SOCIAL HISTORY   Social History   Tobacco Use  . Smoking status: Former    Packs/day:  0.50    Years: 30.00    Pack years: 15.00    Types: Cigarettes    Quit date: 10/02/2013    Years since quitting: 7.3  . Smokeless tobacco: Never  Vaping Use  . Vaping Use: Never used  Substance Use Topics  . Alcohol use: No  . Drug use: No     MEDICATIONS    Home Medication:    Current Medication:  Current Facility-Administered Medications:  .  acetaminophen (TYLENOL) tablet 650 mg, 650 mg, Oral, Q6H PRN, 650 mg at 02/08/21 2123 **OR** acetaminophen (TYLENOL) suppository 650 mg, 650 mg, Rectal, Q6H PRN, Mansy, Jan A, MD .  ALPRAZolam Duanne Moron) tablet 0.5 mg, 0.5 mg, Oral, TID, Ottie Glazier, MD, 0.5 mg at 02/09/21 0910 .  aspirin EC tablet 81 mg, 81 mg, Oral, Daily, Andrez Grime, MD, 81 mg at 02/09/21 0911 .  clopidogrel (PLAVIX) tablet 75 mg, 75 mg, Oral, Daily, Andrez Grime, MD, 75 mg at 02/09/21 0911 .  DULoxetine (CYMBALTA) DR capsule 20 mg, 20 mg, Oral, q AM, Mansy, Jan A, MD, 20 mg at 02/09/21 [id number] .  FLUoxetine (PROZAC) capsule 40 mg, 40 mg, Oral, BID, Mansy, Jan A, MD, 40 mg at 02/09/21 0910 .  furosemide (LASIX) injection 40 mg, 40 mg, Intravenous, BID, Lanney Gins, Eartha Vonbehren, MD, 40 mg at 02/09/21 0911 .  insulin aspart (novoLOG) injection  0-20 Units, 0-20 Units, Subcutaneous, TID WC, Sharion Settler, NP, 15 Units at 02/09/21 0908 .  insulin aspart protamine- aspart (NOVOLOG MIX 70/30) injection 60 Units, 60 Units, Subcutaneous, BID WC, Mansy, Arvella Merles, MD, 60 Units at 02/09/21 0912 .  ipratropium-albuterol (DUONEB) 0.5-2.5 (3) MG/3ML nebulizer solution 3 mL, 3 mL, Nebulization, QID, Mansy, Jan A, MD, 3 mL at 02/09/21 0740 .  levothyroxine (SYNTHROID) tablet 125 mcg, 125 mcg, Oral, Q0600, Mansy, Jan A, MD, 125 mcg at 02/09/21 [phone] .  magnesium hydroxide (MILK OF MAGNESIA) suspension 30 mL, 30 mL, Oral, Daily PRN, Mansy, Jan A, MD .  methylPREDNISolone sodium succinate (SOLU-MEDROL) 40 mg/mL injection 20 mg, 20 mg, Intravenous, Q12H, Ottie Glazier, MD, 20 mg at 02/09/21 [id number] .  [DISCONTINUED] ondansetron (ZOFRAN) tablet 4 mg, 4 mg, Oral, Q6H PRN **OR** ondansetron (ZOFRAN) injection 4 mg, 4 mg, Intravenous, Q6H PRN, Mansy, Jan A, MD .  pantoprazole (PROTONIX) EC tablet 40 mg, 40 mg, Oral, Daily, Lanney Gins, Myrian Botello, MD, 40 mg at 02/09/21 0910 .  potassium chloride (KLOR-CON) CR tablet 10 mEq, 10 mEq, Oral, Daily, Mansy, Jan A, MD, 10 mEq at 02/09/21 [id number]    ALLERGIES   Amitriptyline, Benadryl [diphenhydramine], Demerol [meperidine], Gabapentin, Meperidine hcl, Mirtazapine, Olanzapine, Voltaren [diclofenac sodium], Zetia [ezetimibe], Ativan [lorazepam], Atorvastatin, Budesonide-formoterol fumarate, Bupropion hcl, Caffeine, Codeine sulfate, Lisinopril, Metformin, Mometasone furoate, Morphine sulfate, Other, Oxycodone-acetaminophen, Pioglitazone, Propoxyphene n-acetaminophen, Rosuvastatin, Shellfish allergy, Suvorexant, Ticagrelor, Tramadol, Trazodone and nefazodone, Venlafaxine, Zolpidem tartrate, and Latex     REVIEW OF SYSTEMS    Review of Systems:  Gen:  Denies  fever, sweats, chills weigh loss  HEENT: Denies blurred vision, double vision, ear pain, eye pain, hearing loss, nose bleeds, sore throat Cardiac:  No dizziness, chest  pain or heaviness, chest tightness,edema Resp:   Denies cough or sputum porduction, shortness of breath,wheezing, hemoptysis,  Gi: Denies swallowing difficulty, stomach pain, nausea or vomiting, diarrhea, constipation, bowel incontinence Gu:  Denies bladder incontinence, burning urine Ext:   Denies Joint pain, stiffness or swelling Skin: Denies  skin rash, easy bruising or bleeding or hives Endoc:  Denies polyuria, polydipsia ,  polyphagia or weight change Psych:   Denies depression, insomnia or hallucinations   Other:  All other systems negative   VS: BP (!) 149/80 (BP Location: Right Arm)   Pulse 74   Temp 98.4 F (36.9 C)   Resp 17   Ht '5\' 6"'$  (1.676 m)   Wt 97.1 kg   SpO2 100%   BMI 34.55 kg/m      PHYSICAL EXAM    GENERAL:NAD, no fevers, chills, no weakness no fatigue HEAD: Normocephalic, atraumatic.  EYES: Pupils equal, round, reactive to light. Extraocular muscles intact. No scleral icterus.  MOUTH: Moist mucosal membrane. Dentition intact. No abscess noted.  EAR, NOSE, THROAT: Clear without exudates. No external lesions.  NECK: Supple. No thyromegaly. No nodules. No JVD.  PULMONARY: Mild crackles bilaterally worse at bases posteriorly CARDIOVASCULAR: S1 and S2. Regular rate and rhythm. No murmurs, rubs, or gallops. No edema. Pedal pulses 2+ bilaterally.  GASTROINTESTINAL: Soft, nontender, nondistended. No masses. Positive bowel sounds. No hepatosplenomegaly.  MUSCULOSKELETAL: No swelling, clubbing, or edema. Range of motion full in all extremities.  NEUROLOGIC: Cranial nerves II through XII are intact. No gross focal neurological deficits. Sensation intact. Reflexes intact.  SKIN: No ulceration, lesions, rashes, or cyanosis. Skin warm and dry. Turgor intact.  PSYCHIATRIC: Mood, affect within normal limits. The patient is awake, alert and oriented x 3. Insight, judgment intact.       IMAGING    DG Chest 2 View  Result Date: 02/07/2021 CLINICAL DATA:  Shortness of  breath.  Chest pain. EXAM: CHEST - 2 VIEW COMPARISON:  Most recent radiograph and CT 02/01/2021. FINDINGS: Stable prominent cardiac contours post aortic valve replacement and median sternotomy. Unchanged mediastinal contours. Chronic coarse lung markings. No confluent airspace disease, pleural effusion, or pneumothorax. Ground-glass airspace disease on prior CT are not well seen by radiograph. IMPRESSION: No acute chest finding.  Chronic coarse lung markings. Electronically Signed   By: Keith Rake M.D.   On: 02/07/2021 22:47   CT CHEST WO CONTRAST  Result Date: 02/01/2021 CLINICAL DATA:  COPD exacerbation shob and chest pain that started this arm. EXAM: CT CHEST WITHOUT CONTRAST TECHNIQUE: Multidetector CT imaging of the chest was performed following the standard protocol without IV contrast. COMPARISON:  CT angio chest 08/08/2020, CT angiography chest 06/15/2020 FINDINGS: Cardiovascular: Normal heart size. Status post aortic valve replacement. No significant pericardial effusion. The thoracic aorta is normal in caliber. At least moderate atherosclerotic plaque of the thoracic aorta. Four-vessel coronary artery calcifications status post coronary artery stents. The main pulmonary artery is enlarged in caliber measuring up to 3.5 cm. Mediastinum/Nodes: No gross hilar adenopathy, noting limited sensitivity for the detection of hilar adenopathy on this noncontrast study. No enlarged mediastinal or axillary lymph nodes. Nonspecific debris noted within the esophagus. The thyroid and trachea demonstrate no significant findings. Lungs/Pleura: No focal consolidation. Interval decrease in almost resolved patchy ground-glass airspace opacities with a persistent peribronchovascular nodular-like 1 cm right upper lobe lesion (3:54, 5:78). Interval development of a 1.3 cm ground-glass airspace opacity at the right apex (3:17, 5:104). Trace persistent interlobular septal wall thickening. No pulmonary mass. No pleural  effusion. No pneumothorax. Upper Abdomen: Status post cholecystectomy.  No acute abnormality. Musculoskeletal: No chest wall abnormality. No suspicious lytic or blastic osseous lesions. No acute displaced fracture. Sternotomy wires are noted. IMPRESSION: 1. Interval development of a 1.3 cm ground-glass airspace opacity at the right apex. Persistent peribronchovascular nodular like 1 cm right upper lobe ground-glass airspace opacity. Adenocarcinoma cannot be excluded.  Follow up by CT is recommended in 12 months, with continued annual surveillance for a minimum of 3 years. These recommendations are taken from: Recommendations for the Management of Subsolid Pulmonary Nodules Detected at CT: A Statement from the [facility] 2013; 266:1, 304-317. 2. Enlarged main pulmonary artery suggestive of hypertension. 3. Debris noted within the esophagus. 4. Aortic Atherosclerosis (ICD10-I70.0) including four-vessel coronary artery calcifications status post stent. 5. Status aortic valve replacement. Electronically Signed   By: Iven Finn M.D.   On: 02/01/2021 17:29   CT Angio Chest PE W and/or Wo Contrast  Result Date: 02/08/2021 CLINICAL DATA:  PE suspected, high prob. Dyspnea. Nonproductive cough. EXAM: CT ANGIOGRAPHY CHEST WITH CONTRAST TECHNIQUE: Multidetector CT imaging of the chest was performed using the standard protocol during bolus administration of intravenous contrast. Multiplanar CT image reconstructions and MIPs were obtained to evaluate the vascular anatomy. CONTRAST:  53m OMNIPAQUE IOHEXOL 350 MG/ML SOLN COMPARISON:  02/01/2021 FINDINGS: Cardiovascular: There is adequate opacification of the pulmonary arterial tree. No intraluminal filling defect through the segmental level to suggest acute pulmonary embolism. The central pulmonary arteries are enlarged in keeping with changes of pulmonary arterial hypertension. Extensive multi-vessel coronary artery calcification is noted. Probable  stenting of the proximal right and left circumflex coronary arteries. Coronary artery bypass grafting and aortic valve replacement has been performed. Mild global cardiomegaly is present. Moderate largely noncalcified atheromatous plaque seen throughout the aorta. No aortic aneurysm. Mediastinum/Nodes: No pathologic thoracic adenopathy. The esophagus is unremarkable. Lungs/Pleura: Imaging is slightly limited by motion artifact. There is bilateral asymmetric patchy ground-glass pulmonary infiltrate more focal within the mid lung zones bilaterally possibly infectious or inflammatory in nature as can be seen with COVID-19 pneumonia. This has progressed since prior examination. No pneumothorax or pleural effusion. Central airways are widely patent. Upper Abdomen: Cholecystectomy has been performed. No acute abnormality. Musculoskeletal: No acute bone abnormality. No lytic or blastic bone lesion. Review of the MIP images confirms the above findings. IMPRESSION: No pulmonary embolism. Mild cardiomegaly. Morphologic changes in keeping with pulmonary arterial hypertension. Progressive patchy bilateral mid lung zone ground-glass pulmonary infiltrate, possibly infectious or inflammatory in nature. Serologic correlation for COVID-19 pneumonia may be helpful. Aortic Atherosclerosis (ICD10-I70.0). Electronically Signed   By: AFidela SalisburyM.D.   On: 02/08/2021 02:55   NM Pulmonary Perfusion  Result Date: 01/30/2021 CLINICAL DATA:  Shortness of breath EXAM: NUCLEAR MEDICINE PERFUSION LUNG SCAN TECHNIQUE: Perfusion images were obtained in multiple projections after intravenous injection of radiopharmaceutical. Ventilation scans intentionally deferred if perfusion scan and chest x-ray adequate for interpretation during COVID 19 epidemic. RADIOPHARMACEUTICALS:  4 mCi [id number] IV COMPARISON:  01/28/2021 FINDINGS: There is adequate uptake of radioactive tracer throughout both lungs. No focal defect to suggest pulmonary embolism  is noted. Cardiac shadow is enlarged. IMPRESSION: No focal defect to suggest pulmonary embolism. Electronically Signed   By: MaInez Catalina.D.   On: 01/30/2021 16:07   DG Chest Port 1 View  Result Date: 02/09/2021 CLINICAL DATA:  Bilateral pulmonary infiltrates on CXR EXAM: PORTABLE CHEST - 1 VIEW COMPARISON:  CT from previous day FINDINGS: Coarse nodular airspace opacities throughout both lung fields as before. No pleural effusion. No pneumothorax. Heart size upper limits normal.  Right hilar fullness. Sternotomy wires. Visualized bones unremarkable. IMPRESSION: 1. Persistent coarse nodular bilateral airspace opacities. 2. Right hilar fullness presumably related to regional airspace disease. Electronically Signed   By: D Lucrezia Europe.D.   On: 02/09/2021 08:00   DG Chest Port 1  View  Result Date: 02/01/2021 CLINICAL DATA:  75 year old female with history of shortness of breath. COPD. EXAM: PORTABLE CHEST 1 VIEW COMPARISON:  Chest x-ray 01/30/2021. FINDINGS: Lung volumes are normal. No consolidative airspace disease. No pleural effusions. Diffuse interstitial prominence and widespread peribronchial cuffing. No pneumothorax. No definite suspicious appearing pulmonary nodules or masses are noted. No evidence of pulmonary edema. Mild cardiomegaly. Upper mediastinal contours are within normal limits. Status post hemi median sternotomy for aortic valve replacement with what appears to be a stented bioprosthesis. IMPRESSION: 1. Diffuse interstitial prominence and peribronchial cuffing similar to prior studies suggestive of probable chronic bronchitis. 2. Cardiomegaly. Electronically Signed   By: Vinnie Langton M.D.   On: 02/01/2021 10:49   DG Chest Port 1 View  Result Date: 01/30/2021 CLINICAL DATA:  Post VQ scan EXAM: PORTABLE CHEST 1 VIEW COMPARISON:  01/28/2021, 11/24/2020, 03/04/2018 FINDINGS: Post sternotomy changes with valve replacement. Cardiomegaly with mild vascular congestion. Diffuse interstitial  opacities some of which is felt due to chronic disease. No pleural effusion or pneumothorax. IMPRESSION: 1. Cardiomegaly with slight central congestion and chronic interstitial opacity. Electronically Signed   By: Donavan Foil M.D.   On: 01/30/2021 16:34   DG Chest Port 1 View  Result Date: 01/28/2021 CLINICAL DATA:  Difficulty breathing.  Shortness of breath. EXAM: PORTABLE CHEST 1 VIEW COMPARISON:  November 24, 2020 FINDINGS: Stable cardiomegaly. The hila and mediastinum are normal. No pneumothorax. No nodules or masses. No focal infiltrates. Increased interstitial opacities in the lungs suggesting mild edema. IMPRESSION: Cardiomegaly and mild pulmonary edema. No other acute abnormalities. Electronically Signed   By: Dorise Bullion III M.D.   On: 01/28/2021 19:17   ECHOCARDIOGRAM COMPLETE  Result Date: 02/02/2021    ECHOCARDIOGRAM REPORT   Patient Name:   [name] Date of Exam: 02/02/2021 Medical Rec #:  [id number]     Height:       66.0 in Accession #:    [id number]    Weight:       203.0 lb Date of Birth:  [birth year]      BSA:          2.012 m Patient Age:    50 years      BP:           144/61 mmHg Patient Gender: F             HR:           74 bpm. Exam Location:  [facility] Procedure: 2D Echo, Color Doppler and Cardiac Doppler Indications:     Chest pain R07.9  History:         Patient has prior history of Echocardiogram examinations, most                  recent 11/25/2020. Prior CABG, COPD; Risk Factors:Hypertension.  Sonographer:     Sherrie Sport Referring Phys:  [id number] SARA-MAIZ A THOMAS Diagnosing Phys: Serafina Royals MD  Sonographer Comments: Technically challenging study due to limited acoustic windows, suboptimal parasternal window and no apical window. Image acquisition challenging due to COPD. IMPRESSIONS  1. Left ventricular ejection fraction, by estimation, is 55 to 60%. The left ventricle has normal function. The left ventricle has no regional wall motion abnormalities. Left ventricular diastolic  parameters were normal.  2. Right ventricular systolic function is normal. The right ventricular size is normal.  3. The mitral valve is normal in structure. Mild mitral valve regurgitation. Moderate mitral annular calcification.  4. The aortic valve  is normal in structure. Aortic valve regurgitation is not visualized. FINDINGS  Left Ventricle: Left ventricular ejection fraction, by estimation, is 55 to 60%. The left ventricle has normal function. The left ventricle has no regional wall motion abnormalities. The left ventricular internal cavity size was normal in size. There is  no left ventricular hypertrophy. Left ventricular diastolic parameters were normal. Right Ventricle: The right ventricular size is normal. No increase in right ventricular wall thickness. Right ventricular systolic function is normal. Left Atrium: Left atrial size was normal in size. Right Atrium: Right atrial size was normal in size. Pericardium: There is no evidence of pericardial effusion. Mitral Valve: The mitral valve is normal in structure. Moderate mitral annular calcification. Mild mitral valve regurgitation. Tricuspid Valve: The tricuspid valve is normal in structure. Tricuspid valve regurgitation is mild. Aortic Valve: The aortic valve is normal in structure. Aortic valve regurgitation is not visualized. Pulmonic Valve: The pulmonic valve was normal in structure. Pulmonic valve regurgitation is not visualized. Aorta: The aortic root and ascending aorta are structurally normal, with no evidence of dilitation. IAS/Shunts: No atrial level shunt detected by color flow Doppler.  LEFT VENTRICLE PLAX 2D LVIDd:         4.40 cm LVIDs:         3.10 cm LV PW:         1.50 cm LV IVS:        1.80 cm LVOT diam:     2.10 cm LVOT Area:     3.46 cm  LEFT ATRIUM         Index LA diam:    4.70 cm 2.34 cm/m                        PULMONIC VALVE AORTA                 PV Vmax:        0.84 m/s Ao Root diam: 3.10 cm PV Peak grad:   2.8 mmHg                        RVOT Peak grad: 5 mmHg   SHUNTS Systemic Diam: 2.10 cm Serafina Royals MD Electronically signed by Serafina Royals MD Signature Date/Time: 02/02/2021/4:41:25 PM    Final            ASSESSMENT/PLAN   Acute on chronic hypoxemic respiratory faiulre  -patient has severe multi vessel CAD based on cardiac cath few months ago  - acutely there is evidence of pulmonary edema.  -she had episode of respiratory distress with recurrent panic attack induced hyperventilation -patient does have moderate pulmonary hypertension on RHC -ddimer was elevated previously  -will consider sildenafil TID for pulmonary hypertension once more diruresed -vital were rechecked during my examination and were stable  Moderate pulmonary hypertension   - sildenafil 20 TID post diuresis  Acute pulmonary edema   -recheck TTE   Acute exacerbation of COPD with chronic hypoxemia   -  soluemdrol changed to tapering dose pack.  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  Will dc busbar as patient still with sgnificant anxiety, starting xanax low dose tid to help temporarily with anxiety while hospitalized.  Due to recurrent panic attacks she may benefit from proper psychiatric evaluation and medication optimization .  -s/p  psychiatric evaluation , everything appeared good no need for any new therapy so this points away from panic attack induced dyspnea   Thank you for allowing me to participate in the care of this patient.   Patient/Family are satisfied with care plan and all questions have been answered.  This document was prepared using Dragon voice recognition software and may include unintentional dictation errors.     Ottie Glazier, M.D.  Division of [facility]

## 2021-02-09 NOTE — Progress Notes (Signed)
PT Cancellation Note  Patient Details Name: [name] MRN: [id number] DOB: [birth year]   Cancelled Treatment:    Reason Eval/Treat Not Completed: Medical issues which prohibited therapy. Py chart reviewed. Up-trending troponin this morning - cardiology monitoring trend and symptoms. Glucose also significantly elevated at 478. Will hold therapy today and attempt treatment at a later date.    Patrina Levering PT, DPT 02/09/21 1:35 PM [phone]

## 2021-02-10 ENCOUNTER — Encounter: Payer: Self-pay | Admitting: Family Medicine

## 2021-02-10 ENCOUNTER — Encounter
Admission: EM | Disposition: A | Discharge status: Home / Self Care | Payer: Self-pay | Source: Home / Self Care | Attending: Internal Medicine

## 2021-02-10 DIAGNOSIS — Z515 Encounter for palliative care: Secondary | ICD-10-CM | POA: Diagnosis not present

## 2021-02-10 DIAGNOSIS — J441 Chronic obstructive pulmonary disease with (acute) exacerbation: Secondary | ICD-10-CM | POA: Diagnosis not present

## 2021-02-10 DIAGNOSIS — F411 Generalized anxiety disorder: Secondary | ICD-10-CM | POA: Diagnosis not present

## 2021-02-10 DIAGNOSIS — I214 Non-ST elevation (NSTEMI) myocardial infarction: Secondary | ICD-10-CM | POA: Diagnosis not present

## 2021-02-10 DIAGNOSIS — Z7189 Other specified counseling: Secondary | ICD-10-CM

## 2021-02-10 DIAGNOSIS — J9611 Chronic respiratory failure with hypoxia: Secondary | ICD-10-CM | POA: Diagnosis not present

## 2021-02-10 DIAGNOSIS — R079 Chest pain, unspecified: Secondary | ICD-10-CM | POA: Diagnosis not present

## 2021-02-10 DIAGNOSIS — R778 Other specified abnormalities of plasma proteins: Secondary | ICD-10-CM | POA: Diagnosis not present

## 2021-02-10 HISTORY — PX: RIGHT/LEFT HEART CATH AND CORONARY/GRAFT ANGIOGRAPHY: CATH118267

## 2021-02-10 LAB — MAGNESIUM: Magnesium: 2.1 mg/dL (ref 1.7–2.4)

## 2021-02-10 LAB — CBC WITH DIFFERENTIAL/PLATELET
Abs Immature Granulocytes: 0.17 10*3/uL — ABNORMAL HIGH (ref 0.00–0.07)
Basophils Absolute: 0 10*3/uL (ref 0.0–0.1)
Basophils Relative: 0 %
Eosinophils Absolute: 0 10*3/uL (ref 0.0–0.5)
Eosinophils Relative: 0 %
HCT: 29 % — ABNORMAL LOW (ref 36.0–46.0)
Hemoglobin: 9.8 g/dL — ABNORMAL LOW (ref 12.0–15.0)
Immature Granulocytes: 1 %
Lymphocytes Relative: 8 %
Lymphs Abs: 1.4 10*3/uL (ref 0.7–4.0)
MCH: 28.6 pg (ref 26.0–34.0)
MCHC: 33.8 g/dL (ref 30.0–36.0)
MCV: 84.5 fL (ref 80.0–100.0)
Monocytes Absolute: 1 10*3/uL (ref 0.1–1.0)
Monocytes Relative: 6 %
Neutro Abs: 13.7 10*3/uL — ABNORMAL HIGH (ref 1.7–7.7)
Neutrophils Relative %: 85 %
Platelets: 211 10*3/uL (ref 150–400)
RBC: 3.43 MIL/uL — ABNORMAL LOW (ref 3.87–5.11)
RDW: 13.5 % (ref 11.5–15.5)
WBC: 16.3 10*3/uL — ABNORMAL HIGH (ref 4.0–10.5)
nRBC: 0 % (ref 0.0–0.2)

## 2021-02-10 LAB — GLUCOSE, CAPILLARY
Glucose-Capillary: 379 mg/dL — ABNORMAL HIGH (ref 70–99)
Glucose-Capillary: 187 mg/dL — ABNORMAL HIGH (ref 70–99)
Glucose-Capillary: 123 mg/dL — ABNORMAL HIGH (ref 70–99)
Glucose-Capillary: 126 mg/dL — ABNORMAL HIGH (ref 70–99)
Glucose-Capillary: 186 mg/dL — ABNORMAL HIGH (ref 70–99)

## 2021-02-10 LAB — BASIC METABOLIC PANEL
Anion gap: 8 (ref 5–15)
BUN: 34 mg/dL — ABNORMAL HIGH (ref 8–23)
CO2: 31 mmol/L (ref 22–32)
Calcium: 8.4 mg/dL — ABNORMAL LOW (ref 8.9–10.3)
Chloride: 88 mmol/L — ABNORMAL LOW (ref 98–111)
Creatinine, Ser: 1.14 mg/dL — ABNORMAL HIGH (ref 0.44–1.00)
GFR, Estimated: 50 mL/min — ABNORMAL LOW (ref >60)
Glucose, Bld: 396 mg/dL — ABNORMAL HIGH (ref 70–99)
Potassium: 5 mmol/L (ref 3.5–5.1)
Sodium: 127 mmol/L — ABNORMAL LOW (ref 135–145)

## 2021-02-10 SURGERY — RIGHT/LEFT HEART CATH AND CORONARY/GRAFT ANGIOGRAPHY
Anesthesia: Moderate Sedation

## 2021-02-10 MED ORDER — HEPARIN (PORCINE) 25000 UT/250ML-% IV SOLN
INTRAVENOUS | Status: AC
  Filled 2021-02-10: qty 500

## 2021-02-10 MED ORDER — FENTANYL CITRATE PF 50 MCG/ML IJ SOSY
PREFILLED_SYRINGE | INTRAMUSCULAR | Status: AC
  Filled 2021-02-10: qty 1

## 2021-02-10 MED ORDER — SODIUM CHLORIDE 0.9 % IV SOLN: INTRAVENOUS | Status: DC

## 2021-02-10 MED ORDER — SODIUM CHLORIDE 0.9% FLUSH
3.0000 mL | Freq: Two times a day (BID) | INTRAVENOUS | Status: DC
  Administered 2021-02-10 – 2021-02-13 (×6): 3 mL via INTRAVENOUS

## 2021-02-10 MED ORDER — INSULIN ASPART 100 UNIT/ML IJ SOLN
0.0000 [IU] | Freq: Every day | INTRAMUSCULAR | Status: DC
  Administered 2021-02-11: 3 [IU] via SUBCUTANEOUS
  Administered 2021-02-12: 5 [IU] via SUBCUTANEOUS
  Filled 2021-02-10 (×2): qty 1

## 2021-02-10 MED ORDER — IPRATROPIUM-ALBUTEROL 0.5-2.5 (3) MG/3ML IN SOLN: 3.0000 mL | RESPIRATORY_TRACT | Status: DC | PRN

## 2021-02-10 MED ORDER — TORSEMIDE 20 MG PO TABS
20.0000 mg | ORAL_TABLET | Freq: Every day | ORAL | Status: DC
  Administered 2021-02-11 – 2021-02-13 (×3): 20 mg via ORAL
  Filled 2021-02-10 (×3): qty 1

## 2021-02-10 MED ORDER — MIDAZOLAM HCL 2 MG/2ML IJ SOLN
INTRAMUSCULAR | Status: AC
  Filled 2021-02-10: qty 2

## 2021-02-10 MED ORDER — MIDAZOLAM HCL 2 MG/2ML IJ SOLN
INTRAMUSCULAR | Status: DC | PRN
  Administered 2021-02-10 (×2): .5 mg via INTRAVENOUS

## 2021-02-10 MED ORDER — SODIUM CHLORIDE 0.9% FLUSH: 3.0000 mL | INTRAVENOUS | Status: DC | PRN

## 2021-02-10 MED ORDER — SODIUM CHLORIDE 0.9 % IV SOLN: 250.0000 mL | INTRAVENOUS | Status: DC | PRN

## 2021-02-10 MED ORDER — TRAZODONE HCL 100 MG PO TABS
200.0000 mg | ORAL_TABLET | Freq: Every day | ORAL | Status: DC
  Administered 2021-02-10 – 2021-02-12 (×3): 200 mg via ORAL
  Filled 2021-02-10 (×3): qty 2

## 2021-02-10 MED ORDER — SODIUM CHLORIDE 0.9% FLUSH
3.0000 mL | Freq: Two times a day (BID) | INTRAVENOUS | Status: DC
  Administered 2021-02-10 – 2021-02-13 (×4): 3 mL via INTRAVENOUS

## 2021-02-10 MED ORDER — INSULIN ASPART 100 UNIT/ML IJ SOLN
0.0000 [IU] | Freq: Three times a day (TID) | INTRAMUSCULAR | Status: DC
  Administered 2021-02-10: 3 [IU] via SUBCUTANEOUS
  Administered 2021-02-10: 4 [IU] via SUBCUTANEOUS
  Administered 2021-02-11: 7 [IU] via SUBCUTANEOUS
  Administered 2021-02-11: 15 [IU] via SUBCUTANEOUS
  Administered 2021-02-11 – 2021-02-12 (×2): 20 [IU] via SUBCUTANEOUS
  Administered 2021-02-12: 11 [IU] via SUBCUTANEOUS
  Administered 2021-02-13: 4 [IU] via SUBCUTANEOUS
  Filled 2021-02-10 (×9): qty 1

## 2021-02-10 MED ORDER — IOHEXOL 350 MG/ML SOLN
INTRAVENOUS | Status: DC | PRN
  Administered 2021-02-10: 100 mL via INTRA_ARTERIAL

## 2021-02-10 MED ORDER — FENTANYL CITRATE PF 50 MCG/ML IJ SOSY: 25.0000 ug | PREFILLED_SYRINGE | INTRAMUSCULAR | Status: DC | PRN

## 2021-02-10 MED ORDER — INSULIN ASPART PROT & ASPART (70-30 MIX) 100 UNIT/ML ~~LOC~~ SUSP
70.0000 [IU] | Freq: Two times a day (BID) | SUBCUTANEOUS | Status: DC
  Administered 2021-02-10 – 2021-02-11 (×3): 70 [IU] via SUBCUTANEOUS
  Filled 2021-02-10: qty 10

## 2021-02-10 MED ORDER — HEPARIN (PORCINE) IN NACL 1000-0.9 UT/500ML-% IV SOLN
INTRAVENOUS | Status: AC
  Filled 2021-02-10: qty 1000

## 2021-02-10 MED ORDER — LIDOCAINE HCL (PF) 1 % IJ SOLN
INTRAMUSCULAR | Status: DC | PRN
  Administered 2021-02-10: 10 mL

## 2021-02-10 MED ORDER — SODIUM CHLORIDE 0.9% FLUSH
3.0000 mL | INTRAVENOUS | Status: DC | PRN
Start: 2021-02-10 — End: 2021-02-10

## 2021-02-10 MED ORDER — HEPARIN (PORCINE) IN NACL 1000-0.9 UT/500ML-% IV SOLN
INTRAVENOUS | Status: DC | PRN
  Administered 2021-02-10: 1000 mL

## 2021-02-10 MED ORDER — LIDOCAINE HCL 1 % IJ SOLN
INTRAMUSCULAR | Status: AC
  Filled 2021-02-10: qty 20

## 2021-02-10 MED ORDER — FENTANYL CITRATE (PF) 100 MCG/2ML IJ SOLN
INTRAMUSCULAR | Status: DC | PRN
  Administered 2021-02-10 (×2): 25 ug via INTRAVENOUS

## 2021-02-10 SURGICAL SUPPLY — 16 items
CATH INFINITI 5 FR IM (CATHETERS) ×1 IMPLANT
CATH INFINITI 5FR AL1 (CATHETERS) ×1 IMPLANT
CATH INFINITI 5FR JL4 (CATHETERS) ×1 IMPLANT
CATH INFINITI 5FR JL5 (CATHETERS) ×1 IMPLANT
CATH INFINITI JR4 5F (CATHETERS) ×1 IMPLANT
CATH SWAN GANZ 7F STRAIGHT (CATHETERS) ×1 IMPLANT
DEVICE CLOSURE MYNXGRIP 5F (Vascular Products) ×1 IMPLANT
KIT MICROPUNCTURE NIT STIFF (SHEATH) ×1 IMPLANT
PACK CARDIAC CATH (CUSTOM PROCEDURE TRAY) ×3 IMPLANT
PROTECTION STATION PRESSURIZED (MISCELLANEOUS) ×2
SET ATX SIMPLICITY (MISCELLANEOUS) ×1 IMPLANT
SHEATH AVANTI 5FR X 11CM (SHEATH) ×1 IMPLANT
SHEATH AVANTI 7FRX11 (SHEATH) ×1 IMPLANT
STATION PROTECTION PRESSURIZED (MISCELLANEOUS) IMPLANT
WIRE GUIDERIGHT .035X150 (WIRE) ×1 IMPLANT
WIRE HITORQ VERSACORE ST 145CM (WIRE) ×1 IMPLANT

## 2021-02-10 NOTE — Consult Note (Signed)
[facility] Cardiology  CARDIOLOGY CONSULT NOTE  Patient ID: [name] MRN: [id number] DOB/AGE: November 23, 1945 75 y.o.  Admit date: 02/08/2021 Referring Physician Eugenie Norrie Primary Physician Rusty Aus, MD Primary Cardiologist Nehemiah Massed Reason for Consultation GI bleed, blood thinners  HPI:  [name] is a 75 year old female with history of coronary artery disease (CABG and AVR 2015, balloon angioplasty to proximal circumflex in 2017, DES to mid left circumflex and SVG to RCA in 11/14/2020), type 2 diabetes, hypertension, hyperlipidemia, prior GI bleeding following polypectomy in 2014 who was admitted with shortness of breath.  Interval history - Reportedly had episode of "anxiety" overnight with increased shortness of breath and bloody appearing sputum per report.  - Improved with ongoing diuresis.   Review of systems complete and found to be negative unless listed above     Past Medical History:  Diagnosis Date   1st degree AV block    ACE-inhibitor cough    Allergic rhinitis    Anemia    iron deficiency anemia   Anxiety    Aortic ectasia (HCC)    a. CT abd in 12/2016 incidentally noted aortic atherosclerosis and infrarenal abdominal aortic ectasia measuring as large as 2.7 cm with recommendation to repeat US in 2023.   Arthritis    Asthma    Cataract    Chronic depression    Chronic diastolic CHF (congestive heart failure) (HCC)    Chronic headache    COPD (chronic obstructive pulmonary disease) (HCC)    Coronary artery disease    a. DES to RCA and mid Cx 2009. b. CABG and bioprosthetic AVR May 2015. c. cutting balloon to prox Cx in 05/2016   Diabetes mellitus    type 2   Diverticulitis of colon    Essential hypertension    GERD (gastroesophageal reflux disease)    Hearing loss    History of blood transfusion 2013   History of prosthetic aortic valve replacement    HOH (hard of hearing)    Hypercholesterolemia    intolerance of statins and niaspan   IDA (iron deficiency  anemia) 02/03/2019   Mobitz type 1 second degree AV block    OSA (obstructive sleep apnea)    mild, intolerant of cpap   PAD (peripheral artery disease) ([place])    a. atherosclerosis by CT abd 12/2016 in [place].   PONV (postoperative nausea and vomiting)    Statin intolerance    Thyroid disease     Past Surgical History:  Procedure Laterality Date   ABDOMINAL HYSTERECTOMY     ABDOMINAL HYSTERECTOMY W/ PARTIAL VAGINACTOMY     AORTIC VALVE REPLACEMENT N/A 10/12/2013   Procedure: AORTIC VALVE REPLACEMENT (AVR);  Surgeon: [name], MD;  Location: [place];  Service: Open Heart Surgery;  Laterality: N/A;   APPENDECTOMY  1964   BARTHOLIN GLAND CYST EXCISION     BLADDER SUSPENSION     BREAST BIOPSY Bilateral 09/11/2000   neg   BREAST BIOPSY Left 07/24/2010   neg   BREAST CYST EXCISION  1988   bilateral nonmalignant tumors, x3   CARDIAC CATHETERIZATION     CARDIAC CATHETERIZATION N/A 05/25/2016   Procedure: Coronary Balloon Angioplasty;  Surgeon: Leonie Man, MD;  Location: [place] [facility];  Service: Cardiovascular;  Laterality: N/A;   CARDIAC CATHETERIZATION N/A 05/25/2016   Procedure: Coronary/Graft Angiography;  Surgeon: Leonie Man, MD;  Location: [place] [facility];  Service: Cardiovascular;  Laterality: N/A;   CATARACT EXTRACTION W/ INTRAOCULAR LENS  IMPLANT,  BILATERAL     CHOLECYSTECTOMY  2001   COLECTOMY     lap sigmoid   COLONOSCOPY  2014   polyps found, 2 clamped off.   CORONARY ANGIOPLASTY  10/29/2007   Prox RCA & Mid Cx.   CORONARY ARTERY BYPASS GRAFT N/A 10/12/2013   Procedure: CORONARY ARTERY BYPASS GRAFT TIMES TWO;  Surgeon: [name], MD;  Location: [place] [facility];  Service: Open Heart Surgery;  Laterality: N/A;   CORONARY/GRAFT ANGIOGRAPHY N/A 09/20/2017   Procedure: CORONARY/GRAFT ANGIOGRAPHY;  Surgeon: Sherren Mocha, MD;  Location: [place] [facility];  Service: Cardiovascular;  Laterality: N/A;   LEFT HEART CATHETERIZATION WITH CORONARY ANGIOGRAM N/A 10/09/2013    Procedure: LEFT HEART CATHETERIZATION WITH CORONARY ANGIOGRAM;  Surgeon: [name], MD;  Location: [facility] CATH LAB;  Service: Cardiovascular;  Laterality: N/A;   RIGHT/LEFT HEART CATH AND CORONARY ANGIOGRAPHY N/A 11/03/2020   Procedure: RIGHT/LEFT HEART CATH AND CORONARY ANGIOGRAPHY;  Surgeon: Corey Skains, MD;  Location: [place] [facility];  Service: Cardiovascular;  Laterality: N/A;   STERNAL WIRES REMOVAL N/A 04/13/2014   Procedure: STERNAL WIRES REMOVAL;  Surgeon: [name], MD;  Location: [place];  Service: Thoracic;  Laterality: N/A;   TEE WITHOUT CARDIOVERSION N/A 11/03/2020   Procedure: TRANSESOPHAGEAL ECHOCARDIOGRAM (TEE);  Surgeon: Corey Skains, MD;  Location: [facility];  Service: Cardiovascular;  Laterality: N/A;   THYROIDECTOMY     TONSILLECTOMY     TUBAL LIGATION     VAGINAL DELIVERY     3   VISCERAL ARTERY INTERVENTION N/A 08/16/2016   Procedure: Visceral Artery Intervention;  Surgeon: Algernon Huxley, MD;  Location: [place] [facility];  Service: Cardiovascular;  Laterality: N/A;    Medications Prior to Admission  Medication Sig Dispense Refill Last Dose   ANORO ELLIPTA 62.5-25 MCG/INH AEPB Inhale 1 puff into the lungs daily.   unknown at unknown   aspirin EC 81 MG EC tablet Take 1 tablet (81 mg total) by mouth daily. Swallow whole. 30 tablet 11 unknown at unknown   Calcium-Vitamin D (CALTRATE 600 PLUS-VIT D PO) Take 1 tablet by mouth 2 (two) times daily.   unknown at unknown   clopidogrel (PLAVIX) 75 MG tablet Take 75 mg by mouth daily.   unknown at unknown   DULoxetine (CYMBALTA) 20 MG capsule Take 1 capsule (20 mg total) by mouth in the morning.   unknown at unknown   FLUoxetine (PROZAC) 40 MG capsule Take 40 mg by mouth 2 (two) times daily.   unknown at unknown   hydrOXYzine (ATARAX/VISTARIL) 25 MG tablet Take 1 tablet (25 mg total) by mouth 3 (three) times daily. 90 tablet 0 unknown at unknown   insulin lispro (HUMALOG) 100 UNIT/ML injection Inject 0-54  Units into the skin 4 (four) times daily -  before meals and at bedtime.   unknown at unknown   insulin NPH-regular Human (NOVOLIN 70/30) (70-30) 100 UNIT/ML injection Inject 60 Units into the skin 2 (two) times daily with a meal. Inject 60u under the skin every morning at breakfast and inject 60u under the skin every evening with supper   unknown at unknown   isosorbide mononitrate (IMDUR) 30 MG 24 hr tablet Take 1 tablet (30 mg total) by mouth daily. 30 tablet 11 unknown at unknown   levothyroxine (SYNTHROID, LEVOTHROID) 125 MCG tablet Take 125 mcg by mouth daily before breakfast.   unknown at unknown   losartan (COZAAR) 25 MG tablet Take 1 tablet (25 mg total) by  mouth in the morning.   unknown at unknown   pantoprazole (PROTONIX) 40 MG tablet Take 40 mg by mouth in the morning.   unknown at unknown   potassium chloride (KLOR-CON) 10 MEQ tablet Take 1 tablet (10 mEq total) by mouth daily. 30 tablet 0    torsemide (DEMADEX) 20 MG tablet Take 20 mg by mouth daily.   unknown at unknown   traZODone (DESYREL) 100 MG tablet Take 200 mg by mouth at bedtime.   unknown at unknown   acetaminophen (TYLENOL) 500 MG tablet Take 500-1,000 mg by mouth every 6 (six) hours as needed (pain).   prn at prn   albuterol (PROVENTIL) (2.5 MG/3ML) 0.083% nebulizer solution Take 2.5 mg by nebulization every 4 (four) hours as needed for shortness of breath or wheezing.   prn at prn   insulin glargine (LANTUS) 100 UNIT/ML injection Inject 100 Units into the skin at bedtime. (Patient not taking: Reported on 02/08/2021)   Not Taking at unknown   nystatin (MYCOSTATIN) 100000 UNIT/ML suspension Take by mouth.   prn at prn   promethazine (PHENERGAN) 25 MG tablet Take 25 mg by mouth every 6 (six) hours as needed for nausea or vomiting.   prn at prn   Social History   Socioeconomic History   Marital status: Married    Spouse name: Not on file   Number of children: 3   Years of education: Not on file   Highest education level:  Not on file  Occupational History   Occupation: Retired    Fish farm manager: UNEMPLOYED    Comment: CNA  Tobacco Use   Smoking status: Former    Packs/day: 0.50    Years: 30.00    Pack years: 15.00    Types: Cigarettes    Quit date: 10/02/2013    Years since quitting: 7.3   Smokeless tobacco: Never  Vaping Use   Vaping Use: Never used  Substance and Sexual Activity   Alcohol use: No   Drug use: No   Sexual activity: Not Currently  Other Topics Concern   Not on file  Social History Narrative   Does not have Living Will   Desires CPR, would not want prolonged life support if futile.   Social Determinants of Health   Financial Resource Strain: Not on file  Food Insecurity: Not on file  Transportation Needs: Not on file  Physical Activity: Not on file  Stress: Not on file  Social Connections: Not on file  Intimate Partner Violence: Not on file    Family History  Problem Relation Age of Onset   Breast cancer Mother 81   Hypertension Father    Mesothelioma Father    Asthma Father    Stroke Paternal Grandfather    Heart disease Other    Breast cancer Maternal Aunt    Breast cancer Paternal Aunt       Review of systems complete and found to be negative unless listed above    PHYSICAL EXAM  General: Well developed, well nourished, appears anxious.  HEENT:  Normocephalic and atramatic Neck:  No JVD.  Lungs: Clear bilaterally to auscultation and percussion. Heart: HRRR . [id number] systolic murmur; no crisp closure sound.  Abdomen: Bowel sounds are positive, abdomen soft and non-tender  Msk:  Back normal, normal gait. Normal strength and tone for age. Extremities: No clubbing, cyanosis or edema.   Neuro: Alert and oriented X 3. Psych:  Good affect, responds appropriately  Labs:   Lab Results  Component Value  Date   WBC 14.4 (H) 02/09/2021   HGB 9.0 (L) 02/09/2021   HCT 26.3 (L) 02/09/2021   MCV 84.8 02/09/2021   PLT 196 02/09/2021    Recent Labs  Lab 02/09/21 0427   NA 130*  K 4.5  CL 93*  CO2 28  BUN 31*  CREATININE 1.27*  CALCIUM 7.9*  GLUCOSE 381*    Lab Results  Component Value Date   CKTOTAL 101 05/26/2016   CKMB 8.6 (H) 05/26/2016   TROPONINI <0.03 03/04/2018    Lab Results  Component Value Date   CHOL 173 09/20/2017   CHOL 206 (H) 03/16/2015   CHOL 220 (H) 07/07/2014   Lab Results  Component Value Date   HDL 22 (L) 09/20/2017   HDL 21 (L) 03/16/2015   HDL 30.20 (L) 07/07/2014   Lab Results  Component Value Date   LDLCALC 110 (H) 09/20/2017   LDLCALC 111 03/16/2015   LDLCALC 135 (H) 10/03/2013   Lab Results  Component Value Date   TRIG 207 (H) 09/20/2017   TRIG 369 (H) 03/16/2015   TRIG 329.0 (H) 07/07/2014   Lab Results  Component Value Date   CHOLHDL 7.9 09/20/2017   CHOLHDL 9.8 (H) 03/16/2015   CHOLHDL 7 07/07/2014   Lab Results  Component Value Date   LDLDIRECT 127.0 07/07/2014   LDLDIRECT 172.6 12/24/2008   LDLDIRECT 100.8 11/11/2007      Radiology: DG Chest 2 View  Result Date: 02/07/2021 CLINICAL DATA:  Shortness of breath.  Chest pain. EXAM: CHEST - 2 VIEW COMPARISON:  Most recent radiograph and CT 02/01/2021. FINDINGS: Stable prominent cardiac contours post aortic valve replacement and median sternotomy. Unchanged mediastinal contours. Chronic coarse lung markings. No confluent airspace disease, pleural effusion, or pneumothorax. Ground-glass airspace disease on prior CT are not well seen by radiograph. IMPRESSION: No acute chest finding.  Chronic coarse lung markings. Electronically Signed   By: Keith Rake M.D.   On: 02/07/2021 22:47   CT CHEST WO CONTRAST  Result Date: 02/01/2021 CLINICAL DATA:  COPD exacerbation shob and chest pain that started this arm. EXAM: CT CHEST WITHOUT CONTRAST TECHNIQUE: Multidetector CT imaging of the chest was performed following the standard protocol without IV contrast. COMPARISON:  CT angio chest 08/08/2020, CT angiography chest 06/15/2020 FINDINGS: Cardiovascular:  Normal heart size. Status post aortic valve replacement. No significant pericardial effusion. The thoracic aorta is normal in caliber. At least moderate atherosclerotic plaque of the thoracic aorta. Four-vessel coronary artery calcifications status post coronary artery stents. The main pulmonary artery is enlarged in caliber measuring up to 3.5 cm. Mediastinum/Nodes: No gross hilar adenopathy, noting limited sensitivity for the detection of hilar adenopathy on this noncontrast study. No enlarged mediastinal or axillary lymph nodes. Nonspecific debris noted within the esophagus. The thyroid and trachea demonstrate no significant findings. Lungs/Pleura: No focal consolidation. Interval decrease in almost resolved patchy ground-glass airspace opacities with a persistent peribronchovascular nodular-like 1 cm right upper lobe lesion (3:54, 5:78). Interval development of a 1.3 cm ground-glass airspace opacity at the right apex (3:17, 5:104). Trace persistent interlobular septal wall thickening. No pulmonary mass. No pleural effusion. No pneumothorax. Upper Abdomen: Status post cholecystectomy.  No acute abnormality. Musculoskeletal: No chest wall abnormality. No suspicious lytic or blastic osseous lesions. No acute displaced fracture. Sternotomy wires are noted. IMPRESSION: 1. Interval development of a 1.3 cm ground-glass airspace opacity at the right apex. Persistent peribronchovascular nodular like 1 cm right upper lobe ground-glass airspace opacity. Adenocarcinoma cannot be excluded. Follow up  by CT is recommended in 12 months, with continued annual surveillance for a minimum of 3 years. These recommendations are taken from: Recommendations for the Management of Subsolid Pulmonary Nodules Detected at CT: A Statement from the [facility] 2013; 266:1, 304-317. 2. Enlarged main pulmonary artery suggestive of hypertension. 3. Debris noted within the esophagus. 4. Aortic Atherosclerosis (ICD10-I70.0)  including four-vessel coronary artery calcifications status post stent. 5. Status aortic valve replacement. Electronically Signed   By: Iven Finn M.D.   On: 02/01/2021 17:29   CT Angio Chest PE W and/or Wo Contrast  Result Date: 02/08/2021 CLINICAL DATA:  PE suspected, high prob. Dyspnea. Nonproductive cough. EXAM: CT ANGIOGRAPHY CHEST WITH CONTRAST TECHNIQUE: Multidetector CT imaging of the chest was performed using the standard protocol during bolus administration of intravenous contrast. Multiplanar CT image reconstructions and MIPs were obtained to evaluate the vascular anatomy. CONTRAST:  33m OMNIPAQUE IOHEXOL 350 MG/ML SOLN COMPARISON:  02/01/2021 FINDINGS: Cardiovascular: There is adequate opacification of the pulmonary arterial tree. No intraluminal filling defect through the segmental level to suggest acute pulmonary embolism. The central pulmonary arteries are enlarged in keeping with changes of pulmonary arterial hypertension. Extensive multi-vessel coronary artery calcification is noted. Probable stenting of the proximal right and left circumflex coronary arteries. Coronary artery bypass grafting and aortic valve replacement has been performed. Mild global cardiomegaly is present. Moderate largely noncalcified atheromatous plaque seen throughout the aorta. No aortic aneurysm. Mediastinum/Nodes: No pathologic thoracic adenopathy. The esophagus is unremarkable. Lungs/Pleura: Imaging is slightly limited by motion artifact. There is bilateral asymmetric patchy ground-glass pulmonary infiltrate more focal within the mid lung zones bilaterally possibly infectious or inflammatory in nature as can be seen with COVID-19 pneumonia. This has progressed since prior examination. No pneumothorax or pleural effusion. Central airways are widely patent. Upper Abdomen: Cholecystectomy has been performed. No acute abnormality. Musculoskeletal: No acute bone abnormality. No lytic or blastic bone lesion. Review of  the MIP images confirms the above findings. IMPRESSION: No pulmonary embolism. Mild cardiomegaly. Morphologic changes in keeping with pulmonary arterial hypertension. Progressive patchy bilateral mid lung zone ground-glass pulmonary infiltrate, possibly infectious or inflammatory in nature. Serologic correlation for COVID-19 pneumonia may be helpful. Aortic Atherosclerosis (ICD10-I70.0). Electronically Signed   By: AFidela SalisburyM.D.   On: 02/08/2021 02:55   NM Pulmonary Perfusion  Result Date: 01/30/2021 CLINICAL DATA:  Shortness of breath EXAM: NUCLEAR MEDICINE PERFUSION LUNG SCAN TECHNIQUE: Perfusion images were obtained in multiple projections after intravenous injection of radiopharmaceutical. Ventilation scans intentionally deferred if perfusion scan and chest x-ray adequate for interpretation during COVID 19 epidemic. RADIOPHARMACEUTICALS:  4 mCi Tc-959mAA IV COMPARISON:  01/28/2021 FINDINGS: There is adequate uptake of radioactive tracer throughout both lungs. No focal defect to suggest pulmonary embolism is noted. Cardiac shadow is enlarged. IMPRESSION: No focal defect to suggest pulmonary embolism. Electronically Signed   By: MaInez Catalina.D.   On: 01/30/2021 16:07   DG Chest Port 1 View  Result Date: 02/09/2021 CLINICAL DATA:  Bilateral pulmonary infiltrates on CXR EXAM: PORTABLE CHEST - 1 VIEW COMPARISON:  CT from previous day FINDINGS: Coarse nodular airspace opacities throughout both lung fields as before. No pleural effusion. No pneumothorax. Heart size upper limits normal.  Right hilar fullness. Sternotomy wires. Visualized bones unremarkable. IMPRESSION: 1. Persistent coarse nodular bilateral airspace opacities. 2. Right hilar fullness presumably related to regional airspace disease. Electronically Signed   By: D Lucrezia Europe.D.   On: 02/09/2021 08:00   [facility]  Result Date: 02/01/2021 CLINICAL DATA:  75 year old female with history of shortness of breath. COPD. EXAM:  PORTABLE CHEST 1 VIEW COMPARISON:  Chest x-ray 01/30/2021. FINDINGS: Lung volumes are normal. No consolidative airspace disease. No pleural effusions. Diffuse interstitial prominence and widespread peribronchial cuffing. No pneumothorax. No definite suspicious appearing pulmonary nodules or masses are noted. No evidence of pulmonary edema. Mild cardiomegaly. Upper mediastinal contours are within normal limits. Status post hemi median sternotomy for aortic valve replacement with what appears to be a stented bioprosthesis. IMPRESSION: 1. Diffuse interstitial prominence and peribronchial cuffing similar to prior studies suggestive of probable chronic bronchitis. 2. Cardiomegaly. Electronically Signed   By: Vinnie Langton M.D.   On: 02/01/2021 10:49   DG Chest Port 1 View  Result Date: 01/30/2021 CLINICAL DATA:  Post VQ scan EXAM: PORTABLE CHEST 1 VIEW COMPARISON:  01/28/2021, 11/24/2020, 03/04/2018 FINDINGS: Post sternotomy changes with valve replacement. Cardiomegaly with mild vascular congestion. Diffuse interstitial opacities some of which is felt due to chronic disease. No pleural effusion or pneumothorax. IMPRESSION: 1. Cardiomegaly with slight central congestion and chronic interstitial opacity. Electronically Signed   By: Donavan Foil M.D.   On: 01/30/2021 16:34   DG Chest Port 1 View  Result Date: 01/28/2021 CLINICAL DATA:  Difficulty breathing.  Shortness of breath. EXAM: PORTABLE CHEST 1 VIEW COMPARISON:  November 24, 2020 FINDINGS: Stable cardiomegaly. The hila and mediastinum are normal. No pneumothorax. No nodules or masses. No focal infiltrates. Increased interstitial opacities in the lungs suggesting mild edema. IMPRESSION: Cardiomegaly and mild pulmonary edema. No other acute abnormalities. Electronically Signed   By: Dorise Bullion III M.D.   On: 01/28/2021 19:17   ECHOCARDIOGRAM COMPLETE  Result Date: 02/02/2021    ECHOCARDIOGRAM REPORT   Patient Name:   [name] Date of Exam: 02/02/2021  Medical Rec #:  [id number]     Height:       66.0 in Accession #:    [id number]    Weight:       203.0 lb Date of Birth:  1945-12-05      BSA:          2.012 m Patient Age:    89 years      BP:           144/61 mmHg Patient Gender: F             HR:           74 bpm. Exam Location:  [facility] Procedure: 2D Echo, Color Doppler and Cardiac Doppler Indications:     Chest pain R07.9  History:         Patient has prior history of Echocardiogram examinations, most                  recent 11/25/2020. Prior CABG, COPD; Risk Factors:Hypertension.  Sonographer:     Sherrie Sport Referring Phys:  [id number] SARA-MAIZ A THOMAS Diagnosing Phys: Serafina Royals MD  Sonographer Comments: Technically challenging study due to limited acoustic windows, suboptimal parasternal window and no apical window. Image acquisition challenging due to COPD. IMPRESSIONS  1. Left ventricular ejection fraction, by estimation, is 55 to 60%. The left ventricle has normal function. The left ventricle has no regional wall motion abnormalities. Left ventricular diastolic parameters were normal.  2. Right ventricular systolic function is normal. The right ventricular size is normal.  3. The mitral valve is normal in structure. Mild mitral valve regurgitation. Moderate mitral annular calcification.  4. The aortic valve is normal  in structure. Aortic valve regurgitation is not visualized. FINDINGS  Left Ventricle: Left ventricular ejection fraction, by estimation, is 55 to 60%. The left ventricle has normal function. The left ventricle has no regional wall motion abnormalities. The left ventricular internal cavity size was normal in size. There is  no left ventricular hypertrophy. Left ventricular diastolic parameters were normal. Right Ventricle: The right ventricular size is normal. No increase in right ventricular wall thickness. Right ventricular systolic function is normal. Left Atrium: Left atrial size was normal in size. Right Atrium: Right atrial size was normal  in size. Pericardium: There is no evidence of pericardial effusion. Mitral Valve: The mitral valve is normal in structure. Moderate mitral annular calcification. Mild mitral valve regurgitation. Tricuspid Valve: The tricuspid valve is normal in structure. Tricuspid valve regurgitation is mild. Aortic Valve: The aortic valve is normal in structure. Aortic valve regurgitation is not visualized. Pulmonic Valve: The pulmonic valve was normal in structure. Pulmonic valve regurgitation is not visualized. Aorta: The aortic root and ascending aorta are structurally normal, with no evidence of dilitation. IAS/Shunts: No atrial level shunt detected by color flow Doppler.  LEFT VENTRICLE PLAX 2D LVIDd:         4.40 cm LVIDs:         3.10 cm LV PW:         1.50 cm LV IVS:        1.80 cm LVOT diam:     2.10 cm LVOT Area:     3.46 cm  LEFT ATRIUM         Index LA diam:    4.70 cm 2.34 cm/m                        PULMONIC VALVE AORTA                 PV Vmax:        0.84 m/s Ao Root diam: 3.10 cm PV Peak grad:   2.8 mmHg                       RVOT Peak grad: 5 mmHg   SHUNTS Systemic Diam: 2.10 cm Serafina Royals MD Electronically signed by Serafina Royals MD Signature Date/Time: 02/02/2021/4:41:25 PM    Final     EKG: Sinus rhythm with LVH.  Lateral ST changes.  Cath 11/14/20- Three vessel coronary artery disease.  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  # CAD s/p PCI to SVG and Lcx 11/14/20 #  Elevated troponin The patient is status post CABG and AVR in 2015 and recently underwent stent placement to an SVG and left circumflex in June 2022.  She is not currently having any anginal symptoms.  During this admission she has had no apparent change in symptoms aside from shortness of breath and her troponin has gone up to as high as 1300. She may have an unstable coronary lesion or worsening prosthetic valve stenosis. Consider also demand in the setting of COPD, though I think this requires additional investigation.  -Plavix and aspirin resumed -reasonable to discharge the patient on a p.o. PPI - Will plan for right and left heart catheterization today to evaluate coronary anatomy and for worsening valve stenosis.    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  She is also being treated for COPD exacerbation.  Overall I feel that her symptoms are multifactorial, but it is difficult to eliminate the possibility of worsening aortic valve stenosis. -Agree with lasix 40 mg IV BID -Continue losartan -Continue COPD treatment per the pulmonary team. - Right and left heart cath today.   # Anemia In the presence of aortic valve stenosis could consider hemolysis and/or GI bleeding from AVMs slowly contributing to her anemia. -We will consider sending hemolysis labs.  Signed: Andrez Grime MD 02/10/2021,  7:37 AM

## 2021-02-10 NOTE — Care Management Important Message (Signed)
Important Message  Patient Details  Name: [name] MRN: [id number] Date of Birth: [birth year]   Medicare Important Message Given:  Yes     Dannette Barbara 02/10/2021, 3:20 PM

## 2021-02-10 NOTE — Progress Notes (Signed)
Patient ID: [name], female   DOB: 1945/08/07, 75 y.o.   MRN: [id number]  PROGRESS NOTE    KAYLIN STRODTMAN  [id number] DOB: 1945-09-25 DOA: 02/08/2021 PCP: Rusty Aus, MD   Brief Narrative:  75 year old female with history of asthma/COPD, CAD status post CABG and PCI, diabetes mellitus type 2, GERD, headache, anxiety, recent 2 hospitalizations for COPD exacerbation treated with steroids presented again with worsening shortness of breath.  On presentation, high-sensitivity troponin was 101 113 with BNP of 423.  Stool Hemoccult was positive.  Chest x-ray showed chronic coarse lung markings with no acute chest findings.  She was treated with IV Lasix and Solu-Medrol.  Cardiology/GI/pulmonary were consulted.  Assessment & Plan:   Possible COPD exacerbation Chronic respiratory failure with hypoxia -CTA chest showed no pulmonary embolism but showed progressive patchy bilateral mid lung zone groundglass pulmonary infiltrate, possibly infectious or inflammatory in nature.  COVID-19 test was negative.  Pulmonary following.  IV Solu-Medrol has been switched to tapering Medrol dose pack by pulmonary on 02/09/2021.  Continue nebs -Patient on 3 L oxygen by nasal cannula at home.  Currently on 2 L oxygen.  Chest tightness/positive troponin in patient with CAD status post PCI and CABG and AVR -Cardiology following.  High-sensitivity troponins peaked at 1360 on 02/09/2021.  Had chest pain again this morning. -Continue aspirin and Plavix.  Cardiology is planning for cardiac catheterization today.  Acute pulmonary edema -IV Lasix has been switched to oral Lasix and spironolactone by pulmonary on 02/09/2021.  Strict input and output.  Daily weights.  Fluid restriction.  Chronically disease stage IIIb -Creatinine stable.  Monitor  Anemia of chronic disease Fecal occult blood positive in stool -Patient has chronic anemia with only mild drop in hemoglobin.  No evidence of overt GI bleeding.  GI consult  appreciated.  No need for inpatient procedures.  Antiplatelets have been resumed today. -Hemoglobin 9 today.  Hyponatremia -Monitor.  Diabetes mellitus type 2 with hyperglycemia -Hyperglycemia aggravated by steroid use.  Increase NPH to 70 units twice daily along with CBGs with SSI.  Depression -Continue Prozac and Cymbalta.  Low-dose Xanax has been started by pulmonary which will be continued.  Psychiatry consultation appreciated: Continue current regimen.  Will need outpatient follow-up with PCP/psychiatry.    Generalized deconditioning -Overall prognosis is guarded to poor.  Currently listed as full code.  Palliative care consultation for goals of care discussion -PT recommends home health PT  Hypertension -Blood pressure stable.  Continue Lasix and spironolactone.  Hypothyroidism--continue Synthroid   DVT prophylaxis: SCDs Code Status: Full Family Communication: Husband at bedside on 02/10/2021. Disposition Plan: Status is: Inpatient  Remains inpatient appropriate because:Inpatient level of care appropriate due to severity of illness  Dispo: The patient is from: Home              Anticipated d/c is to: Home              Patient currently is not medically stable to d/c.   Difficult to place patient No   Consultants: Cardiology/GI/pulmonary  Procedures: None  Antimicrobials: None   Subjective: Patient seen and examined at bedside.  Poor historian.  Had episodes of anxiety and chest pain overnight.  Denies worsening shortness of breath.  No fever or vomiting reported. Objective: Vitals:   02/10/21 0433 02/10/21 0645 02/10/21 0729 02/10/21 0810  BP: (!) 142/71 137/71  116/66  Pulse: 80 72  80  Resp: '18 18  18  '$ Temp: 98.4 F (36.9 C) 99.1  F (37.3 C)  97.9 F (36.6 C)  TempSrc: Oral Oral    SpO2: 98% 98% 98% 99%  Weight:      Height:        Intake/Output Summary (Last 24 hours) at 02/10/2021 1109 Last data filed at 02/10/2021 1057 Gross per 24 hour  Intake 960  ml  Output 5850 ml  Net -4890 ml    Filed Weights   02/07/21 2223 02/08/21 1206 02/10/21 0055  Weight: 90.7 kg 97.1 kg 92.9 kg    Examination:  General exam: No distress.  On 2 L oxygen by nasal cannula currently.  Looks chronically ill and deconditioned.  Hard of hearing Respiratory system: Decreased breath sounds at bases bilaterally with some crackles  cardiovascular system: Rate controlled, S1-S2 heard gastrointestinal system: Abdomen is distended slightly, soft and nontender.  Bowel sounds are heard  extremities: Lower extremity edema present bilaterally; no clubbing  Central nervous system: Awake, still slow to respond.  Poor historian.  No focal neurological deficits.  Moves extremities.   Skin: No obvious ecchymosis/lesions  psychiatry: Mostly flat affect but gets anxious intermittently   Data Reviewed: I have personally reviewed following labs and imaging studies  CBC: Recent Labs  Lab 02/07/21 2306 02/08/21 0630 02/09/21 0427 02/10/21 0731  WBC 10.4 9.4 14.4* 16.3*  NEUTROABS  --   --  13.1* 13.7*  HGB 8.9* 8.9* 9.0* 9.8*  HCT 26.6* 27.4* 26.3* 29.0*  MCV 86.6 86.2 84.8 84.5  PLT 167 171 196 [id number]    Basic Metabolic Panel: Recent Labs  Lab 02/07/21 2306 02/08/21 0630 02/09/21 0427 02/10/21 0731  NA 131* 131* 130* 127*  K 5.3* 4.7 4.5 5.0  CL 100 96* 93* 88*  CO2 '25 25 28 31  '$ GLUCOSE 233* 429* 381* 396*  BUN 17 20 31* 34*  CREATININE 1.04* 1.22* 1.27* 1.14*  CALCIUM 8.0* 7.9* 7.9* 8.4*  MG  --   --  1.8 2.1    GFR: Estimated Creatinine Clearance: 48.2 mL/min (A) (by C-G formula based on SCr of 1.14 mg/dL (H)). Liver Function Tests: No results for input(s): AST, ALT, ALKPHOS, BILITOT, PROT, ALBUMIN in the last 168 hours. No results for input(s): LIPASE, AMYLASE in the last 168 hours. No results for input(s): AMMONIA in the last 168 hours. Coagulation Profile: No results for input(s): INR, PROTIME in the last 168 hours. Cardiac Enzymes: No results  for input(s): CKTOTAL, CKMB, CKMBINDEX, TROPONINI in the last 168 hours. BNP (last 3 results) No results for input(s): PROBNP in the last 8760 hours. HbA1C: Recent Labs    02/08/21 0630  HGBA1C 8.1*    CBG: Recent Labs  Lab 02/09/21 1151 02/09/21 1153 02/09/21 1656 02/09/21 2119 02/10/21 0811  GLUCAP 540* 478* 335* 342* 379*    Lipid Profile: No results for input(s): CHOL, HDL, LDLCALC, TRIG, CHOLHDL, LDLDIRECT in the last 72 hours. Thyroid Function Tests: No results for input(s): TSH, T4TOTAL, FREET4, T3FREE, THYROIDAB in the last 72 hours. Anemia Panel: No results for input(s): VITAMINB12, FOLATE, FERRITIN, TIBC, IRON, RETICCTPCT in the last 72 hours. Sepsis Labs: No results for input(s): PROCALCITON, LATICACIDVEN in the last 168 hours.  Recent Results (from the past 240 hour(s))  Resp Panel by RT-PCR (Flu A&B, Covid) Nasopharyngeal Swab     Status: None   Collection Time: 02/01/21 12:10 PM   Specimen: Nasopharyngeal Swab; Nasopharyngeal(NP) swabs in vial transport medium  Result Value Ref Range Status   SARS Coronavirus 2 by RT PCR NEGATIVE NEGATIVE Final  Comment: (NOTE) SARS-CoV-2 target nucleic acids are NOT DETECTED.  The SARS-CoV-2 RNA is generally detectable in upper respiratory specimens during the acute phase of infection. The lowest concentration of SARS-CoV-2 viral copies this assay can detect is 138 copies/mL. A negative result does not preclude SARS-Cov-2 infection and should not be used as the sole basis for treatment or other patient management decisions. A negative result may occur with  improper specimen collection/handling, submission of specimen other than nasopharyngeal swab, presence of viral mutation(s) within the areas targeted by this assay, and inadequate number of viral copies(<138 copies/mL). A negative result must be combined with clinical observations, patient history, and epidemiological information. The expected result is  Negative.  Fact Sheet for Patients:  EntrepreneurPulse.com.au  Fact Sheet for Healthcare Providers:  IncredibleEmployment.be  This test is no t yet approved or cleared by the [place] FDA and  has been authorized for detection and/or diagnosis of SARS-CoV-2 by FDA under an Emergency Use Authorization (EUA). This EUA will remain  in effect (meaning this test can be used) for the duration of the COVID-19 declaration under Section 564(b)(1) of the Act, 21 U.S.C.section 360bbb-3(b)(1), unless the authorization is terminated  or revoked sooner.       Influenza A by PCR NEGATIVE NEGATIVE Final   Influenza B by PCR NEGATIVE NEGATIVE Final    Comment: (NOTE) The Xpert Xpress SARS-CoV-2/FLU/RSV plus assay is intended as an aid in the diagnosis of influenza from Nasopharyngeal swab specimens and should not be used as a sole basis for treatment. Nasal washings and aspirates are unacceptable for Xpert Xpress SARS-CoV-2/FLU/RSV testing.  Fact Sheet for Patients: EntrepreneurPulse.com.au  Fact Sheet for Healthcare Providers: IncredibleEmployment.be  This test is not yet approved or cleared by the [place] FDA and has been authorized for detection and/or diagnosis of SARS-CoV-2 by FDA under an Emergency Use Authorization (EUA). This EUA will remain in effect (meaning this test can be used) for the duration of the COVID-19 declaration under Section 564(b)(1) of the Act, 21 U.S.C. section 360bbb-3(b)(1), unless the authorization is terminated or revoked.  Performed at [facility], [address] [place], [place] [address]   Urine Culture     Status: Abnormal   Collection Time: 02/01/21 10:20 PM   Specimen: Urine, Random  Result Value Ref Range Status   Specimen Description   Final    URINE, RANDOM Performed at [facility], [address] [place], [place] [address]    Special  Requests   Final    NONE Performed at [facility], [place], [place] [address]    Culture (A)  Final    >=100,000 COLONIES/mL KLEBSIELLA PNEUMONIAE 50,000 COLONIES/mL ESCHERICHIA COLI    Report Status 02/05/2021 FINAL  Final   Organism ID, Bacteria KLEBSIELLA PNEUMONIAE (A)  Final   Organism ID, Bacteria ESCHERICHIA COLI (A)  Final      Susceptibility   Escherichia coli - MIC*    AMPICILLIN 4 SENSITIVE Sensitive     CEFAZOLIN <=4 SENSITIVE Sensitive     CEFEPIME <=0.12 SENSITIVE Sensitive     CEFTRIAXONE <=0.25 SENSITIVE Sensitive     CIPROFLOXACIN <=0.25 SENSITIVE Sensitive     GENTAMICIN <=1 SENSITIVE Sensitive     IMIPENEM <=0.25 SENSITIVE Sensitive     NITROFURANTOIN <=16 SENSITIVE Sensitive     TRIMETH/SULFA <=20 SENSITIVE Sensitive     AMPICILLIN/SULBACTAM <=2 SENSITIVE Sensitive     PIP/TAZO <=4 SENSITIVE Sensitive     * 50,000 COLONIES/mL ESCHERICHIA  COLI   Klebsiella pneumoniae - MIC*    AMPICILLIN RESISTANT Resistant     CEFAZOLIN <=4 SENSITIVE Sensitive     CEFEPIME <=0.12 SENSITIVE Sensitive     CEFTRIAXONE <=0.25 SENSITIVE Sensitive     CIPROFLOXACIN <=0.25 SENSITIVE Sensitive     GENTAMICIN <=1 SENSITIVE Sensitive     IMIPENEM <=0.25 SENSITIVE Sensitive     NITROFURANTOIN <=16 SENSITIVE Sensitive     TRIMETH/SULFA <=20 SENSITIVE Sensitive     AMPICILLIN/SULBACTAM 4 SENSITIVE Sensitive     PIP/TAZO <=4 SENSITIVE Sensitive     * >=100,000 COLONIES/mL KLEBSIELLA PNEUMONIAE  Resp Panel by RT-PCR (Flu A&B, Covid) Nasopharyngeal Swab     Status: None   Collection Time: 02/08/21  2:00 AM   Specimen: Nasopharyngeal Swab; Nasopharyngeal(NP) swabs in vial transport medium  Result Value Ref Range Status   SARS Coronavirus 2 by RT PCR NEGATIVE NEGATIVE Final    Comment: (NOTE) SARS-CoV-2 target nucleic acids are NOT DETECTED.  The SARS-CoV-2 RNA is generally detectable in upper respiratory specimens during the acute phase of infection. The  lowest concentration of SARS-CoV-2 viral copies this assay can detect is 138 copies/mL. A negative result does not preclude SARS-Cov-2 infection and should not be used as the sole basis for treatment or other patient management decisions. A negative result may occur with  improper specimen collection/handling, submission of specimen other than nasopharyngeal swab, presence of viral mutation(s) within the areas targeted by this assay, and inadequate number of viral copies(<138 copies/mL). A negative result must be combined with clinical observations, patient history, and epidemiological information. The expected result is Negative.  Fact Sheet for Patients:  EntrepreneurPulse.com.au  Fact Sheet for Healthcare Providers:  IncredibleEmployment.be  This test is no t yet approved or cleared by the [place] FDA and  has been authorized for detection and/or diagnosis of SARS-CoV-2 by FDA under an Emergency Use Authorization (EUA). This EUA will remain  in effect (meaning this test can be used) for the duration of the COVID-19 declaration under Section 564(b)(1) of the Act, 21 U.S.C.section 360bbb-3(b)(1), unless the authorization is terminated  or revoked sooner.       Influenza A by PCR NEGATIVE NEGATIVE Final   Influenza B by PCR NEGATIVE NEGATIVE Final    Comment: (NOTE) The Xpert Xpress SARS-CoV-2/FLU/RSV plus assay is intended as an aid in the diagnosis of influenza from Nasopharyngeal swab specimens and should not be used as a sole basis for treatment. Nasal washings and aspirates are unacceptable for Xpert Xpress SARS-CoV-2/FLU/RSV testing.  Fact Sheet for Patients: EntrepreneurPulse.com.au  Fact Sheet for Healthcare Providers: IncredibleEmployment.be  This test is not yet approved or cleared by the [place] FDA and has been authorized for detection and/or diagnosis of SARS-CoV-2 by FDA under  an Emergency Use Authorization (EUA). This EUA will remain in effect (meaning this test can be used) for the duration of the COVID-19 declaration under Section 564(b)(1) of the Act, 21 U.S.C. section 360bbb-3(b)(1), unless the authorization is terminated or revoked.  Performed at [facility], [address], [place], [place] [address]           Radiology Studies: DG Chest [place] 1 View  Result Date: 02/09/2021 CLINICAL DATA:  Bilateral pulmonary infiltrates on CXR EXAM: PORTABLE CHEST - 1 VIEW COMPARISON:  CT from previous day FINDINGS: Coarse nodular airspace opacities throughout both lung fields as before. No pleural effusion. No pneumothorax. Heart size upper limits normal.  Right hilar fullness. Sternotomy wires. Visualized bones unremarkable. IMPRESSION: 1.  Persistent coarse nodular bilateral airspace opacities. 2. Right hilar fullness presumably related to regional airspace disease. Electronically Signed   By: Lucrezia Europe M.D.   On: 02/09/2021 08:00        Scheduled Meds:  aspirin EC  81 mg Oral Daily   clopidogrel  75 mg Oral Daily   DULoxetine  20 mg Oral q AM   FLUoxetine  40 mg Oral BID   furosemide  40 mg Oral Daily   guaiFENesin  600 mg Oral BID   insulin aspart  0-20 Units Subcutaneous TID WC   insulin aspart protamine- aspart  60 Units Subcutaneous BID WC   ipratropium-albuterol  3 mL Nebulization BID   levothyroxine  125 mcg Oral Q0600   methylPREDNISolone  4 mg Oral PC supper   methylPREDNISolone  4 mg Oral 3 x daily with food   [START ON 02/11/2021] methylPREDNISolone  4 mg Oral 4X daily taper   methylPREDNISolone  8 mg Oral Nightly   pantoprazole  40 mg Oral Daily   potassium chloride  10 mEq Oral Daily   sodium chloride flush  3 mL Intravenous Q12H   spironolactone  25 mg Oral Daily   traZODone  200 mg Oral QHS   Continuous Infusions:        Aline August, MD [facility] 02/10/2021, 11:09 AM

## 2021-02-10 NOTE — Progress Notes (Signed)
Pulmonary Medicine          Date: 02/10/2021,   MRN# [id number] [name] 07-07-1945     AdmissionWeight: 90.7 kg                 CurrentWeight: 92.9 kg   Referring physician: Dr Remi Haggard   CHIEF COMPLAINT:   Respiratory distress with Acute exacerbation of COPD   HISTORY OF PRESENT ILLNESS   75 yo with hx of CABG 2015 , depression,anxiety, hypertension, OSA, COPD, iron deficiency anemia, aortic dilatation, presents emergency department for cc of dyspnea and sob. She is chronically hypoxemic with 3L/min [place] requirement. At bedside, she is no longer tripoding and resting in ED bed and does not appear to be in acute distress. She fully participated with physical exam. Patient presented to the ER with acute onset of worsening dyspnea today with associated chest tightness that is diffuse without radiation and associated nonproductive cough with dyspnea he was recently admitted here and had a negative VQ scan.  She denied any leg pain or worsening edema or recent travels or surgeries.  No melena or bright red bleeding per rectum.  No dysuria, oliguria or hematuria or flank pain.She is able to tell me her full name, DOB, location of hospital, and the current calendar year.She denies recent fever, nausea, vomiting, abdominal pain, dysuria, diarrhea, hematuria, blood in her stool, syncope, lost of consciousness. She reports chest pain every time she coughs. She reports no new cough. She is unsure if she has had weight gain or lost. She denies flu like illness. She denies LE edema. PCCM consult placed for further evaluation and management. Patient has Sjogrens disease her CT chest shows inflammatory changes and pulmonary edema. She is asking to go home but I explained this premature and she agrees to stay.   02/09/21- patient is improved, overnight 800cc diuresis.  Steroids reduced to PO tapering medrol dose pack. Psychiatry consultation to review anxiety/panic attack component to her  recurrent respiratory distress. Appreciate collaboration with everyone involved in her care.    PAST MEDICAL HISTORY   Past Medical History:  Diagnosis Date  . 1st degree AV block   . ACE-inhibitor cough   . Allergic rhinitis   . Anemia    iron deficiency anemia  . Anxiety   . Aortic ectasia (HCC)    a. CT abd in 12/2016 incidentally noted aortic atherosclerosis and infrarenal abdominal aortic ectasia measuring as large as 2.7 cm with recommendation to repeat US in 2023.  Marland Kitchen Arthritis   . Asthma   . Cataract   . Chronic depression   . Chronic diastolic CHF (congestive heart failure) ([place])   . Chronic headache   . COPD (chronic obstructive pulmonary disease) ([place])   . Coronary artery disease    a. DES to RCA and mid Cx 2009. b. CABG and bioprosthetic AVR May 2015. c. cutting balloon to prox Cx in 05/2016  . Diabetes mellitus    type 2  . Diverticulitis of colon   . Essential hypertension   . GERD (gastroesophageal reflux disease)   . Hearing loss   . History of blood transfusion 2013  . History of prosthetic aortic valve replacement   . HOH (hard of hearing)   . Hypercholesterolemia    intolerance of statins and niaspan  . IDA (iron deficiency anemia) 02/03/2019  . Mobitz type 1 second degree AV block   . OSA (obstructive sleep apnea)    mild, intolerant of cpap  .  PAD (peripheral artery disease) ([place])    a. atherosclerosis by CT abd 12/2016 in [place].  Marland Kitchen PONV (postoperative nausea and vomiting)   . Statin intolerance   . Thyroid disease      SURGICAL HISTORY   Past Surgical History:  Procedure Laterality Date  . ABDOMINAL HYSTERECTOMY    . ABDOMINAL HYSTERECTOMY W/ PARTIAL VAGINACTOMY    . AORTIC VALVE REPLACEMENT N/A 10/12/2013   Procedure: AORTIC VALVE REPLACEMENT (AVR);  Surgeon: [name], MD;  Location: [place];  Service: Open Heart Surgery;  Laterality: N/A;  . [place]  . BARTHOLIN GLAND CYST EXCISION    . BLADDER SUSPENSION    . BREAST BIOPSY  Bilateral 09/11/2000   neg  . BREAST BIOPSY Left 07/24/2010   neg  . BREAST CYST EXCISION  1988   bilateral nonmalignant tumors, x3  . CARDIAC CATHETERIZATION    . CARDIAC CATHETERIZATION N/A 05/25/2016   Procedure: Coronary Balloon Angioplasty;  Surgeon: Leonie Man, MD;  Location: [place] [facility];  Service: Cardiovascular;  Laterality: N/A;  . CARDIAC CATHETERIZATION N/A 05/25/2016   Procedure: Coronary/Graft Angiography;  Surgeon: Leonie Man, MD;  Location: [place] [facility];  Service: Cardiovascular;  Laterality: N/A;  . CATARACT EXTRACTION W/ INTRAOCULAR LENS  IMPLANT, BILATERAL    . CHOLECYSTECTOMY  2001  . COLECTOMY     lap sigmoid  . COLONOSCOPY  2014   polyps found, 2 clamped off.  . CORONARY ANGIOPLASTY  10/29/2007   Prox RCA & Mid Cx.  . CORONARY ARTERY BYPASS GRAFT N/A 10/12/2013   Procedure: CORONARY ARTERY BYPASS GRAFT TIMES TWO;  Surgeon: [name], MD;  Location: [place];  Service: Open Heart Surgery;  Laterality: N/A;  . CORONARY/GRAFT ANGIOGRAPHY N/A 09/20/2017   Procedure: CORONARY/GRAFT ANGIOGRAPHY;  Surgeon: Sherren Mocha, MD;  Location: [place] [facility];  Service: Cardiovascular;  Laterality: N/A;  . LEFT HEART CATHETERIZATION WITH CORONARY ANGIOGRAM N/A 10/09/2013   Procedure: LEFT HEART CATHETERIZATION WITH CORONARY ANGIOGRAM;  Surgeon: [name], MD;  Location: [facility];  Service: Cardiovascular;  Laterality: N/A;  . RIGHT/LEFT HEART CATH AND CORONARY ANGIOGRAPHY N/A 11/03/2020   Procedure: RIGHT/LEFT HEART CATH AND CORONARY ANGIOGRAPHY;  Surgeon: Corey Skains, MD;  Location: [place] [facility];  Service: Cardiovascular;  Laterality: N/A;  . STERNAL WIRES REMOVAL N/A 04/13/2014   Procedure: STERNAL WIRES REMOVAL;  Surgeon: [name], MD;  Location: [place];  Service: Thoracic;  Laterality: N/A;  . TEE WITHOUT CARDIOVERSION N/A 11/03/2020   Procedure: TRANSESOPHAGEAL ECHOCARDIOGRAM (TEE);  Surgeon: Corey Skains, MD;   Location: [facility];  Service: Cardiovascular;  Laterality: N/A;  . THYROIDECTOMY    . TONSILLECTOMY    . TUBAL LIGATION    . VAGINAL DELIVERY     3  . VISCERAL ARTERY INTERVENTION N/A 08/16/2016   Procedure: Visceral Artery Intervention;  Surgeon: Algernon Huxley, MD;  Location: [place] [facility];  Service: Cardiovascular;  Laterality: N/A;     FAMILY HISTORY   Family History  Problem Relation Age of Onset  . Breast cancer Mother 25  . Hypertension Father   . Mesothelioma Father   . Asthma Father   . Stroke Paternal Grandfather   . Heart disease Other   . Breast cancer Maternal Aunt   . Breast cancer Paternal Aunt      SOCIAL HISTORY   Social History   Tobacco Use  . Smoking status: Former    Packs/day:  0.50    Years: 30.00    Pack years: 15.00    Types: Cigarettes    Quit date: 10/02/2013    Years since quitting: 7.3  . Smokeless tobacco: Never  Vaping Use  . Vaping Use: Never used  Substance Use Topics  . Alcohol use: No  . Drug use: No     MEDICATIONS    Home Medication:    Current Medication:  Current Facility-Administered Medications:  .  acetaminophen (TYLENOL) tablet 650 mg, 650 mg, Oral, Q6H PRN, 650 mg at 02/08/21 2123 **OR** acetaminophen (TYLENOL) suppository 650 mg, 650 mg, Rectal, Q6H PRN, Mansy, Jan A, MD .  ALPRAZolam Duanne Moron) tablet 0.5 mg, 0.5 mg, Oral, TID PRN, Starla Link, Kshitiz, MD, 0.5 mg at 02/10/21 0406 .  aspirin EC tablet 81 mg, 81 mg, Oral, Daily, Andrez Grime, MD, 81 mg at 02/10/21 0839 .  clopidogrel (PLAVIX) tablet 75 mg, 75 mg, Oral, Daily, Andrez Grime, MD, 75 mg at 02/10/21 0839 .  DULoxetine (CYMBALTA) DR capsule 20 mg, 20 mg, Oral, q AM, Mansy, Jan A, MD, 20 mg at 02/10/21 0843 .  fentaNYL (SUBLIMAZE) injection 25 mcg, 25 mcg, Intravenous, Q4H PRN, Mansy, Jan A, MD .  FLUoxetine (PROZAC) capsule 40 mg, 40 mg, Oral, BID, Mansy, Jan A, MD, 40 mg at 02/10/21 0840 .  furosemide (LASIX) tablet 40 mg, 40 mg, Oral, Daily,  Ottie Glazier, MD, 40 mg at 02/10/21 0839 .  guaiFENesin (MUCINEX) 12 hr tablet 600 mg, 600 mg, Oral, BID, Mansy, Jan A, MD, 600 mg at 02/10/21 0839 .  insulin aspart (novoLOG) injection 0-20 Units, 0-20 Units, Subcutaneous, TID WC, Sharion Settler, NP, 20 Units at 02/10/21 0844 .  insulin aspart protamine- aspart (NOVOLOG MIX 70/30) injection 60 Units, 60 Units, Subcutaneous, BID WC, Mansy, Arvella Merles, MD, 60 Units at 02/10/21 0845 .  ipratropium-albuterol (DUONEB) 0.5-2.5 (3) MG/3ML nebulizer solution 3 mL, 3 mL, Nebulization, BID, Alekh, Kshitiz, MD, 3 mL at 02/10/21 0728 .  levothyroxine (SYNTHROID) tablet 125 mcg, 125 mcg, Oral, Q0600, Mansy, Jan A, MD, 125 mcg at 02/10/21 0607 .  magnesium hydroxide (MILK OF MAGNESIA) suspension 30 mL, 30 mL, Oral, Daily PRN, Mansy, Jan A, MD .  methylPREDNISolone (MEDROL DOSEPAK) tablet 4 mg, 4 mg, Oral, PC supper, Lanney Gins, Chan Sheahan, MD .  methylPREDNISolone (MEDROL DOSEPAK) tablet 4 mg, 4 mg, Oral, 3 x daily with food, Ottie Glazier, MD, 4 mg at 02/10/21 0853 .  [START ON 02/11/2021] methylPREDNISolone (MEDROL DOSEPAK) tablet 4 mg, 4 mg, Oral, 4X daily taper, Lanney Gins, Bari Handshoe, MD .  methylPREDNISolone (MEDROL DOSEPAK) tablet 8 mg, 8 mg, Oral, Nightly, Aurther Harlin, MD .  [DISCONTINUED] ondansetron (ZOFRAN) tablet 4 mg, 4 mg, Oral, Q6H PRN **OR** ondansetron (ZOFRAN) injection 4 mg, 4 mg, Intravenous, Q6H PRN, Mansy, Jan A, MD, 4 mg at 02/09/21 1327 .  ondansetron (ZOFRAN) tablet 4 mg, 4 mg, Oral, Q8H PRN, Alekh, Kshitiz, MD .  pantoprazole (PROTONIX) EC tablet 40 mg, 40 mg, Oral, Daily, Lanney Gins, Valborg Friar, MD, 40 mg at 02/10/21 0839 .  potassium chloride (KLOR-CON) CR tablet 10 mEq, 10 mEq, Oral, Daily, Mansy, Jan A, MD, 10 mEq at 02/10/21 0839 .  sodium chloride flush (NS) 0.9 % injection 3 mL, 3 mL, Intravenous, Q12H, Andrez Grime, MD .  spironolactone (ALDACTONE) tablet 25 mg, 25 mg, Oral, Daily, Ottie Glazier, MD, 25 mg at 02/10/21 0839 .  traZODone  (DESYREL) tablet 200 mg, 200 mg, Oral, QHS, Mansy, Arvella Merles, MD  ALLERGIES   Amitriptyline, Benadryl [diphenhydramine], Demerol [meperidine], Gabapentin, Meperidine hcl, Mirtazapine, Olanzapine, Voltaren [diclofenac sodium], Zetia [ezetimibe], Ativan [lorazepam], Atorvastatin, Budesonide-formoterol fumarate, Bupropion hcl, Caffeine, Codeine sulfate, Lisinopril, Metformin, Mometasone furoate, Morphine sulfate, Other, Oxycodone-acetaminophen, Pioglitazone, Propoxyphene n-acetaminophen, Rosuvastatin, Shellfish allergy, Suvorexant, Ticagrelor, Tramadol, Trazodone and nefazodone, Venlafaxine, Zolpidem tartrate, and Latex     REVIEW OF SYSTEMS    Review of Systems:  Gen:  Denies  fever, sweats, chills weigh loss  HEENT: Denies blurred vision, double vision, ear pain, eye pain, hearing loss, nose bleeds, sore throat Cardiac:  No dizziness, chest pain or heaviness, chest tightness,edema Resp:   Denies cough or sputum porduction, shortness of breath,wheezing, hemoptysis,  Gi: Denies swallowing difficulty, stomach pain, nausea or vomiting, diarrhea, constipation, bowel incontinence Gu:  Denies bladder incontinence, burning urine Ext:   Denies Joint pain, stiffness or swelling Skin: Denies  skin rash, easy bruising or bleeding or hives Endoc:  Denies polyuria, polydipsia , polyphagia or weight change Psych:   Denies depression, insomnia or hallucinations   Other:  All other systems negative   VS: BP 116/66 (BP Location: Left Arm)   Pulse 80   Temp 97.9 F (36.6 C)   Resp 18   Ht '5\' 6"'$  (1.676 m)   Wt 92.9 kg   SpO2 99%   BMI 33.07 kg/m      PHYSICAL EXAM    GENERAL:NAD, no fevers, chills, no weakness no fatigue HEAD: Normocephalic, atraumatic.  EYES: Pupils equal, round, reactive to light. Extraocular muscles intact. No scleral icterus.  MOUTH: Moist mucosal membrane. Dentition intact. No abscess noted.  EAR, NOSE, THROAT: Clear without exudates. No external lesions.  NECK:  Supple. No thyromegaly. No nodules. No JVD.  PULMONARY: Mild crackles bilaterally worse at bases posteriorly CARDIOVASCULAR: S1 and S2. Regular rate and rhythm. No murmurs, rubs, or gallops. No edema. Pedal pulses 2+ bilaterally.  GASTROINTESTINAL: Soft, nontender, nondistended. No masses. Positive bowel sounds. No hepatosplenomegaly.  MUSCULOSKELETAL: No swelling, clubbing, or edema. Range of motion full in all extremities.  NEUROLOGIC: Cranial nerves II through XII are intact. No gross focal neurological deficits. Sensation intact. Reflexes intact.  SKIN: No ulceration, lesions, rashes, or cyanosis. Skin warm and dry. Turgor intact.  PSYCHIATRIC: Mood, affect within normal limits. The patient is awake, alert and oriented x 3. Insight, judgment intact.       IMAGING    DG Chest 2 View  Result Date: 02/07/2021 CLINICAL DATA:  Shortness of breath.  Chest pain. EXAM: CHEST - 2 VIEW COMPARISON:  Most recent radiograph and CT 02/01/2021. FINDINGS: Stable prominent cardiac contours post aortic valve replacement and median sternotomy. Unchanged mediastinal contours. Chronic coarse lung markings. No confluent airspace disease, pleural effusion, or pneumothorax. Ground-glass airspace disease on prior CT are not well seen by radiograph. IMPRESSION: No acute chest finding.  Chronic coarse lung markings. Electronically Signed   By: Keith Rake M.D.   On: 02/07/2021 22:47   CT CHEST WO CONTRAST  Result Date: 02/01/2021 CLINICAL DATA:  COPD exacerbation shob and chest pain that started this arm. EXAM: CT CHEST WITHOUT CONTRAST TECHNIQUE: Multidetector CT imaging of the chest was performed following the standard protocol without IV contrast. COMPARISON:  CT angio chest 08/08/2020, CT angiography chest 06/15/2020 FINDINGS: Cardiovascular: Normal heart size. Status post aortic valve replacement. No significant pericardial effusion. The thoracic aorta is normal in caliber. At least moderate atherosclerotic  plaque of the thoracic aorta. Four-vessel coronary artery calcifications status post coronary artery stents. The main pulmonary artery is enlarged  in caliber measuring up to 3.5 cm. Mediastinum/Nodes: No gross hilar adenopathy, noting limited sensitivity for the detection of hilar adenopathy on this noncontrast study. No enlarged mediastinal or axillary lymph nodes. Nonspecific debris noted within the esophagus. The thyroid and trachea demonstrate no significant findings. Lungs/Pleura: No focal consolidation. Interval decrease in almost resolved patchy ground-glass airspace opacities with a persistent peribronchovascular nodular-like 1 cm right upper lobe lesion (3:54, 5:78). Interval development of a 1.3 cm ground-glass airspace opacity at the right apex (3:17, 5:104). Trace persistent interlobular septal wall thickening. No pulmonary mass. No pleural effusion. No pneumothorax. Upper Abdomen: Status post cholecystectomy.  No acute abnormality. Musculoskeletal: No chest wall abnormality. No suspicious lytic or blastic osseous lesions. No acute displaced fracture. Sternotomy wires are noted. IMPRESSION: 1. Interval development of a 1.3 cm ground-glass airspace opacity at the right apex. Persistent peribronchovascular nodular like 1 cm right upper lobe ground-glass airspace opacity. Adenocarcinoma cannot be excluded. Follow up by CT is recommended in 12 months, with continued annual surveillance for a minimum of 3 years. These recommendations are taken from: Recommendations for the Management of Subsolid Pulmonary Nodules Detected at CT: A Statement from the [facility] 2013; 266:1, 304-317. 2. Enlarged main pulmonary artery suggestive of hypertension. 3. Debris noted within the esophagus. 4. Aortic Atherosclerosis (ICD10-I70.0) including four-vessel coronary artery calcifications status post stent. 5. Status aortic valve replacement. Electronically Signed   By: Iven Finn M.D.   On: 02/01/2021  17:29   CT Angio Chest PE W and/or Wo Contrast  Result Date: 02/08/2021 CLINICAL DATA:  PE suspected, high prob. Dyspnea. Nonproductive cough. EXAM: CT ANGIOGRAPHY CHEST WITH CONTRAST TECHNIQUE: Multidetector CT imaging of the chest was performed using the standard protocol during bolus administration of intravenous contrast. Multiplanar CT image reconstructions and MIPs were obtained to evaluate the vascular anatomy. CONTRAST:  53m OMNIPAQUE IOHEXOL 350 MG/ML SOLN COMPARISON:  02/01/2021 FINDINGS: Cardiovascular: There is adequate opacification of the pulmonary arterial tree. No intraluminal filling defect through the segmental level to suggest acute pulmonary embolism. The central pulmonary arteries are enlarged in keeping with changes of pulmonary arterial hypertension. Extensive multi-vessel coronary artery calcification is noted. Probable stenting of the proximal right and left circumflex coronary arteries. Coronary artery bypass grafting and aortic valve replacement has been performed. Mild global cardiomegaly is present. Moderate largely noncalcified atheromatous plaque seen throughout the aorta. No aortic aneurysm. Mediastinum/Nodes: No pathologic thoracic adenopathy. The esophagus is unremarkable. Lungs/Pleura: Imaging is slightly limited by motion artifact. There is bilateral asymmetric patchy ground-glass pulmonary infiltrate more focal within the mid lung zones bilaterally possibly infectious or inflammatory in nature as can be seen with COVID-19 pneumonia. This has progressed since prior examination. No pneumothorax or pleural effusion. Central airways are widely patent. Upper Abdomen: Cholecystectomy has been performed. No acute abnormality. Musculoskeletal: No acute bone abnormality. No lytic or blastic bone lesion. Review of the MIP images confirms the above findings. IMPRESSION: No pulmonary embolism. Mild cardiomegaly. Morphologic changes in keeping with pulmonary arterial hypertension.  Progressive patchy bilateral mid lung zone ground-glass pulmonary infiltrate, possibly infectious or inflammatory in nature. Serologic correlation for COVID-19 pneumonia may be helpful. Aortic Atherosclerosis (ICD10-I70.0). Electronically Signed   By: AFidela SalisburyM.D.   On: 02/08/2021 02:55   NM Pulmonary Perfusion  Result Date: 01/30/2021 CLINICAL DATA:  Shortness of breath EXAM: NUCLEAR MEDICINE PERFUSION LUNG SCAN TECHNIQUE: Perfusion images were obtained in multiple projections after intravenous injection of radiopharmaceutical. Ventilation scans intentionally deferred if perfusion scan and chest x-ray adequate  for interpretation during COVID 19 epidemic. RADIOPHARMACEUTICALS:  4 mCi Tc-75mMAA IV COMPARISON:  01/28/2021 FINDINGS: There is adequate uptake of radioactive tracer throughout both lungs. No focal defect to suggest pulmonary embolism is noted. Cardiac shadow is enlarged. IMPRESSION: No focal defect to suggest pulmonary embolism. Electronically Signed   By: MInez CatalinaM.D.   On: 01/30/2021 16:07   DG Chest Port 1 View  Result Date: 02/09/2021 CLINICAL DATA:  Bilateral pulmonary infiltrates on CXR EXAM: PORTABLE CHEST - 1 VIEW COMPARISON:  CT from previous day FINDINGS: Coarse nodular airspace opacities throughout both lung fields as before. No pleural effusion. No pneumothorax. Heart size upper limits normal.  Right hilar fullness. Sternotomy wires. Visualized bones unremarkable. IMPRESSION: 1. Persistent coarse nodular bilateral airspace opacities. 2. Right hilar fullness presumably related to regional airspace disease. Electronically Signed   By: DLucrezia EuropeM.D.   On: 02/09/2021 08:00   DG Chest Port 1 View  Result Date: 02/01/2021 CLINICAL DATA:  75year old female with history of shortness of breath. COPD. EXAM: PORTABLE CHEST 1 VIEW COMPARISON:  Chest x-ray 01/30/2021. FINDINGS: Lung volumes are normal. No consolidative airspace disease. No pleural effusions. Diffuse interstitial  prominence and widespread peribronchial cuffing. No pneumothorax. No definite suspicious appearing pulmonary nodules or masses are noted. No evidence of pulmonary edema. Mild cardiomegaly. Upper mediastinal contours are within normal limits. Status post hemi median sternotomy for aortic valve replacement with what appears to be a stented bioprosthesis. IMPRESSION: 1. Diffuse interstitial prominence and peribronchial cuffing similar to prior studies suggestive of probable chronic bronchitis. 2. Cardiomegaly. Electronically Signed   By: DVinnie LangtonM.D.   On: 02/01/2021 10:49   DG Chest Port 1 View  Result Date: 01/30/2021 CLINICAL DATA:  Post VQ scan EXAM: PORTABLE CHEST 1 VIEW COMPARISON:  01/28/2021, 11/24/2020, 03/04/2018 FINDINGS: Post sternotomy changes with valve replacement. Cardiomegaly with mild vascular congestion. Diffuse interstitial opacities some of which is felt due to chronic disease. No pleural effusion or pneumothorax. IMPRESSION: 1. Cardiomegaly with slight central congestion and chronic interstitial opacity. Electronically Signed   By: KDonavan FoilM.D.   On: 01/30/2021 16:34   DG Chest Port 1 View  Result Date: 01/28/2021 CLINICAL DATA:  Difficulty breathing.  Shortness of breath. EXAM: PORTABLE CHEST 1 VIEW COMPARISON:  November 24, 2020 FINDINGS: Stable cardiomegaly. The hila and mediastinum are normal. No pneumothorax. No nodules or masses. No focal infiltrates. Increased interstitial opacities in the lungs suggesting mild edema. IMPRESSION: Cardiomegaly and mild pulmonary edema. No other acute abnormalities. Electronically Signed   By: DDorise BullionIII M.D.   On: 01/28/2021 19:17   ECHOCARDIOGRAM COMPLETE  Result Date: 02/02/2021    ECHOCARDIOGRAM REPORT   Patient Name:   SLINDALOU MASLOSKIDate of Exam: 02/02/2021 Medical Rec #:  [id number]    Height:       66.0 in Accession #:    [id number]   Weight:       203.0 lb Date of Birth:  [birth year]     BSA:          2.012 m Patient Age:     89years      BP:           144/61 mmHg Patient Gender: F             HR:           74 bpm. Exam Location:  [facility] Procedure: 2D Echo, Color Doppler and Cardiac Doppler Indications:  Chest pain R07.9  History:         Patient has prior history of Echocardiogram examinations, most                  recent 11/25/2020. Prior CABG, COPD; Risk Factors:Hypertension.  Sonographer:     Sherrie Sport Referring Phys:  [id number] SARA-MAIZ A THOMAS Diagnosing Phys: Serafina Royals MD  Sonographer Comments: Technically challenging study due to limited acoustic windows, suboptimal parasternal window and no apical window. Image acquisition challenging due to COPD. IMPRESSIONS  1. Left ventricular ejection fraction, by estimation, is 55 to 60%. The left ventricle has normal function. The left ventricle has no regional wall motion abnormalities. Left ventricular diastolic parameters were normal.  2. Right ventricular systolic function is normal. The right ventricular size is normal.  3. The mitral valve is normal in structure. Mild mitral valve regurgitation. Moderate mitral annular calcification.  4. The aortic valve is normal in structure. Aortic valve regurgitation is not visualized. FINDINGS  Left Ventricle: Left ventricular ejection fraction, by estimation, is 55 to 60%. The left ventricle has normal function. The left ventricle has no regional wall motion abnormalities. The left ventricular internal cavity size was normal in size. There is  no left ventricular hypertrophy. Left ventricular diastolic parameters were normal. Right Ventricle: The right ventricular size is normal. No increase in right ventricular wall thickness. Right ventricular systolic function is normal. Left Atrium: Left atrial size was normal in size. Right Atrium: Right atrial size was normal in size. Pericardium: There is no evidence of pericardial effusion. Mitral Valve: The mitral valve is normal in structure. Moderate mitral annular calcification. Mild  mitral valve regurgitation. Tricuspid Valve: The tricuspid valve is normal in structure. Tricuspid valve regurgitation is mild. Aortic Valve: The aortic valve is normal in structure. Aortic valve regurgitation is not visualized. Pulmonic Valve: The pulmonic valve was normal in structure. Pulmonic valve regurgitation is not visualized. Aorta: The aortic root and ascending aorta are structurally normal, with no evidence of dilitation. IAS/Shunts: No atrial level shunt detected by color flow Doppler.  LEFT VENTRICLE PLAX 2D LVIDd:         4.40 cm LVIDs:         3.10 cm LV PW:         1.50 cm LV IVS:        1.80 cm LVOT diam:     2.10 cm LVOT Area:     3.46 cm  LEFT ATRIUM         Index LA diam:    4.70 cm 2.34 cm/m                        PULMONIC VALVE AORTA                 PV Vmax:        0.84 m/s Ao Root diam: 3.10 cm PV Peak grad:   2.8 mmHg                       RVOT Peak grad: 5 mmHg   SHUNTS Systemic Diam: 2.10 cm Serafina Royals MD Electronically signed by Serafina Royals MD Signature Date/Time: 02/02/2021/4:41:25 PM    Final            ASSESSMENT/PLAN   Acute on chronic hypoxemic respiratory faiulre  -patient has severe multi vessel CAD based on cardiac cath few months ago  - acutely there is evidence of pulmonary  edema.  -she had episode of respiratory distress with recurrent panic attack induced hyperventilation -patient does have moderate pulmonary hypertension on RHC -ddimer was elevated previously  -will consider sildenafil TID for pulmonary hypertension once more diruresed -vital were rechecked during my examination and were stable  Moderate pulmonary hypertension   - sildenafil 20 TID post diuresis  Acute pulmonary edema   -recheck TTE   Acute exacerbation of COPD with chronic hypoxemia   -  soluemdrol changed to tapering dose pack.  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  Will dc busbar as patient still with sgnificant anxiety, starting xanax low dose tid to help temporarily with anxiety while hospitalized.  Due to recurrent panic attacks she may benefit from proper psychiatric evaluation and medication optimization .  -s/p psychiatric evaluation , everything appeared good no need for any new therapy so this points away from panic attack induced dyspnea   Thank you for allowing me to participate in the care of this patient.   Patient/Family are satisfied with care plan and all questions have been answered.  This document was prepared using Dragon voice recognition software and may include unintentional dictation errors.     Ottie Glazier, M.D.  Division of [facility]

## 2021-02-10 NOTE — Plan of Care (Signed)

## 2021-02-10 NOTE — Progress Notes (Addendum)
Inpatient Diabetes Program Recommendations  AACE/ADA: New Consensus Statement on Inpatient Glycemic Control   Target Ranges:  Prepandial:   less than 140 mg/dL      Peak postprandial:   less than 180 mg/dL (1-2 hours)      Critically ill patients:  140 - 180 mg/dL  Results for [name], [name] (MRN [id number]) as of 02/10/2021 09:22  Ref. Range 02/10/2021 08:11  Glucose-Capillary Latest Ref Range: 70 - 99 mg/dL 379 (H)   Results for [name], [name] (MRN [id number]) as of 02/10/2021 09:22  Ref. Range 02/09/2021 07:40 02/09/2021 11:51 02/09/2021 11:53 02/09/2021 16:56 02/09/2021 21:19  Glucose-Capillary Latest Ref Range: 70 - 99 mg/dL 325 (H) 540 (HH) 478 (H) 335 (H) 342 (H)   Review of Glycemic Control  Diabetes history: DM2 Outpatient Diabetes medications:  70/30 60 units BID, Humalog 0-54 units QID Current orders for Inpatient glycemic control: 70/30 60 units BID, Novolog 0-20 units TID with meals; Medrol dose pack  Inpatient Diabetes Program Recommendations:    Insulin: If steroids are continued, please consider increasing 70/30 to 70 units BID and changing frequency of Novolog 0-20 units to AC&HS.   Thanks, Barnie Alderman, RN, MSN, CDE Diabetes Coordinator Inpatient Diabetes Program [phone] (Team Pager from 8am to 5pm)

## 2021-02-10 NOTE — Progress Notes (Signed)
PT Cancellation Note  Patient Details Name: SHATIRA POPPA MRN: [id number] DOB: [birth year]   Cancelled Treatment:    Reason Eval/Treat Not Completed: Patient at procedure or test/unavailable (Patient currently off unit for cardiac cath.  Will re-attempt at later time/date as medically appropriate and available.)   Ralston Venus H. Owens Shark, PT, DPT, NCS 02/10/21, 2:00 PM [phone]

## 2021-02-10 NOTE — Consult Note (Signed)
Consultation Note Date: 02/10/2021   Patient Name: [name]  DOB: 1945-09-08  MRN: [id number]  Age / Sex: 75 y.o., female  PCP: Rusty Aus, MD Referring Physician: Aline August, MD  Reason for Consultation: Establishing goals of care and Psychosocial/spiritual support  HPI/Patient Profile: 75 y.o. female  with past medical history of asthma, COPD, CAD sp CABG, aortic valve replacement, HTN/HLD, chronic diastolic heart failure, PCI, DM2, GERD, PAD, diverticulitis, anxiety, depression, HOH admitted on 02/08/2021 with chest pain. CAD s/p PCI to SVG and Lcx 11/14/20  Clinical Assessment and Goals of Care: I have reviewed medical records including EPIC notes, labs and imaging, received report from RN, assessed the patient.  Mrs. Guzy is lying quietly in bed.  She greets me, making and mostly keeping eye contact.  She appears somewhat weak, but not frail.  She is alert and oriented, able to make her basic needs known.  There is no family at bedside at this time.  We meet at bedside to discuss diagnosis prognosis, GOC, EOL wishes, disposition and options.  I introduced Palliative Medicine as specialized medical care for people living with serious illness. It focuses on providing relief from the symptoms and stress of a serious illness. The goal is to improve quality of life for both the patient and the family.  We discussed a brief life review of the patient.  She lives with her husband Francee Piccolo.  She shares that he has a leg prosthetic for 6 to 8 months now, but assist with housework.  She shares that she is independent with ADLs, but does not do much housework.  She shares that her son lives next-door, and his girlfriend gets groceries and supplies.  She shares that her daughter also helps as needed.    We then focused on their current illness.  Mrs. Benninger is able to tell me accurately about her plan of care.   She shares that she is to have a cardiac cath today around 2:00.  She shares that she has been having trouble breathing and is hopeful that this will relieve her shortness of breath.  We talked about recent heart cath in June.  The natural disease trajectory and expectations at EOL were discussed.  Advanced directives, concepts specific to code status, were considered and discussed.  Although Mrs. Stiver has stated in the past that she would want short-term life support, after frank discussion today, she tells me that she would not want life support.  She shares that her husband and her 3 children are aware of her choice and are in agreement.  Discussed the importance of continued conversation with family and the medical providers regarding overall plan of care and treatment options, ensuring decisions are within the context of the patient's values and GOCs.  Questions and concerns were addressed.  The patient was encouraged to call with questions or concerns.  PMT will continue to support holistically.  Conference with attending, cardiology, bedside nursing staff, transition of care team related to patient condition, needs, goals of  care, disposition.  HCPOA   NEXT OF KIN -Mrs. Mcnulty names her husband, Loritta Michalak, as her healthcare surrogate.  She has 3 adult children.    SUMMARY OF RECOMMENDATIONS   At this point continue to treat the treatable but no CPR or intubation Time for outcomes Anticipate need for home health at most, but at this point she declines home health.   Code Status/Advance Care Planning: DNR  Symptom Management:  Per hospitalist, no additional needs at this time.  Palliative Prophylaxis:  Frequent Pain Assessment and Oral Care  Additional Recommendations (Limitations, Scope, Preferences): Continue to treat the treatable but no CPR or intubation  Psycho-social/Spiritual:  Desire for further Chaplaincy support:no Additional Recommendations: Caregiving   Support/Resources  Prognosis:  Unable to determine, based on outcomes.  6 to 12 months or more would not be surprising.  Discharge Planning:  To be determined, based on outcomes.  Unlikely to need short-term rehab, at this point states she would not want home health either.       Primary Diagnoses: Present on Admission:  COPD exacerbation ([place])  GAD (generalized anxiety disorder)   I have reviewed the medical record, interviewed the patient and family, and examined the patient. The following aspects are pertinent.  Past Medical History:  Diagnosis Date   1st degree AV block    ACE-inhibitor cough    Allergic rhinitis    Anemia    iron deficiency anemia   Anxiety    Aortic ectasia (HCC)    a. CT abd in 12/2016 incidentally noted aortic atherosclerosis and infrarenal abdominal aortic ectasia measuring as large as 2.7 cm with recommendation to repeat US in 2023.   Arthritis    Asthma    Cataract    Chronic depression    Chronic diastolic CHF (congestive heart failure) (HCC)    Chronic headache    COPD (chronic obstructive pulmonary disease) (HCC)    Coronary artery disease    a. DES to RCA and mid Cx 2009. b. CABG and bioprosthetic AVR May 2015. c. cutting balloon to prox Cx in 05/2016   Diabetes mellitus    type 2   Diverticulitis of colon    Essential hypertension    GERD (gastroesophageal reflux disease)    Hearing loss    History of blood transfusion 2013   History of prosthetic aortic valve replacement    HOH (hard of hearing)    Hypercholesterolemia    intolerance of statins and niaspan   IDA (iron deficiency anemia) 02/03/2019   Mobitz type 1 second degree AV block    OSA (obstructive sleep apnea)    mild, intolerant of cpap   PAD (peripheral artery disease) ([place])    a. atherosclerosis by CT abd 12/2016 in [place].   PONV (postoperative nausea and vomiting)    Statin intolerance    Thyroid disease    Social History   Socioeconomic History   Marital status:  Married    Spouse name: Not on file   Number of children: 3   Years of education: Not on file   Highest education level: Not on file  Occupational History   Occupation: Retired    Fish farm manager: UNEMPLOYED    Comment: CNA  Tobacco Use   Smoking status: Former    Packs/day: 0.50    Years: 30.00    Pack years: 15.00    Types: Cigarettes    Quit date: 10/02/2013    Years since quitting: 7.3   Smokeless tobacco: Never  Vaping Use  Vaping Use: Never used  Substance and Sexual Activity   Alcohol use: No   Drug use: No   Sexual activity: Not Currently  Other Topics Concern   Not on file  Social History Narrative   Does not have Living Will   Desires CPR, would not want prolonged life support if futile.   Social Determinants of Health   Financial Resource Strain: Not on file  Food Insecurity: Not on file  Transportation Needs: Not on file  Physical Activity: Not on file  Stress: Not on file  Social Connections: Not on file   Family History  Problem Relation Age of Onset   Breast cancer Mother 58   Hypertension Father    Mesothelioma Father    Asthma Father    Stroke Paternal Grandfather    Heart disease Other    Breast cancer Maternal Aunt    Breast cancer Paternal Aunt    Scheduled Meds:  aspirin EC  81 mg Oral Daily   clopidogrel  75 mg Oral Daily   DULoxetine  20 mg Oral q AM   FLUoxetine  40 mg Oral BID   furosemide  40 mg Oral Daily   guaiFENesin  600 mg Oral BID   insulin aspart  0-20 Units Subcutaneous TID WC   insulin aspart  0-5 Units Subcutaneous QHS   insulin aspart protamine- aspart  70 Units Subcutaneous BID WC   levothyroxine  125 mcg Oral Q0600   methylPREDNISolone  4 mg Oral PC supper   methylPREDNISolone  4 mg Oral 3 x daily with food   [START ON 02/11/2021] methylPREDNISolone  4 mg Oral 4X daily taper   methylPREDNISolone  8 mg Oral Nightly   pantoprazole  40 mg Oral Daily   potassium chloride  10 mEq Oral Daily   sodium chloride flush  3 mL  Intravenous Q12H   spironolactone  25 mg Oral Daily   traZODone  200 mg Oral QHS   Continuous Infusions: PRN Meds:.acetaminophen **OR** acetaminophen, ALPRAZolam, fentaNYL (SUBLIMAZE) injection, ipratropium-albuterol, magnesium hydroxide, [DISCONTINUED] ondansetron **OR** ondansetron (ZOFRAN) IV, ondansetron Medications Prior to Admission:  Prior to Admission medications   Medication Sig Start Date End Date Taking? Authorizing Provider  ANORO ELLIPTA 62.5-25 MCG/INH AEPB Inhale 1 puff into the lungs daily. 10/11/20  Yes [provider]  aspirin EC 81 MG EC tablet Take 1 tablet (81 mg total) by mouth daily. Swallow whole. 08/10/20  Yes Samuella Cota, MD  Calcium-Vitamin D (CALTRATE 600 PLUS-VIT D PO) Take 1 tablet by mouth 2 (two) times daily.   Yes [provider]  clopidogrel (PLAVIX) 75 MG tablet Take 75 mg by mouth daily. 11/15/20  Yes [provider]  DULoxetine (CYMBALTA) 20 MG capsule Take 1 capsule (20 mg total) by mouth in the morning. 11/26/20  Yes Jennye Boroughs, MD  FLUoxetine (PROZAC) 40 MG capsule Take 40 mg by mouth 2 (two) times daily. 08/23/20  Yes [provider]  hydrOXYzine (ATARAX/VISTARIL) 25 MG tablet Take 1 tablet (25 mg total) by mouth 3 (three) times daily. 02/02/21 03/04/21 Yes Mariel Aloe, MD  insulin lispro (HUMALOG) 100 UNIT/ML injection Inject 0-54 Units into the skin 4 (four) times daily -  before meals and at bedtime.   Yes [provider]  insulin NPH-regular Human (NOVOLIN 70/30) (70-30) 100 UNIT/ML injection Inject 60 Units into the skin 2 (two) times daily with a meal. Inject 60u under the skin every morning at breakfast and inject 60u under the skin every  evening with supper   Yes [provider]  isosorbide mononitrate (IMDUR) 30 MG 24 hr tablet Take 1 tablet (30 mg total) by mouth daily. 11/03/20 11/03/21 Yes Corey Skains, MD  levothyroxine (SYNTHROID, LEVOTHROID) 125 MCG tablet Take 125 mcg by mouth  daily before breakfast. 04/03/17  Yes [provider]  losartan (COZAAR) 25 MG tablet Take 1 tablet (25 mg total) by mouth in the morning. 11/26/20  Yes Jennye Boroughs, MD  pantoprazole (PROTONIX) 40 MG tablet Take 40 mg by mouth in the morning.   Yes [provider]  potassium chloride (KLOR-CON) 10 MEQ tablet Take 1 tablet (10 mEq total) by mouth daily. 11/26/20  Yes Jennye Boroughs, MD  torsemide (DEMADEX) 20 MG tablet Take 20 mg by mouth daily. 12/08/20  Yes [provider]  traZODone (DESYREL) 100 MG tablet Take 200 mg by mouth at bedtime.   Yes [provider]  acetaminophen (TYLENOL) 500 MG tablet Take 500-1,000 mg by mouth every 6 (six) hours as needed (pain).    [provider]  albuterol (PROVENTIL) (2.5 MG/3ML) 0.083% nebulizer solution Take 2.5 mg by nebulization every 4 (four) hours as needed for shortness of breath or wheezing. 06/07/20   [provider]  insulin glargine (LANTUS) 100 UNIT/ML injection Inject 100 Units into the skin at bedtime. Patient not taking: Reported on 02/08/2021    [provider]  nystatin (MYCOSTATIN) 100000 UNIT/ML suspension Take by mouth. 11/21/20   [provider]  promethazine (PHENERGAN) 25 MG tablet Take 25 mg by mouth every 6 (six) hours as needed for nausea or vomiting.    [provider]   Allergies  Allergen Reactions   Amitriptyline Other (See Comments)    Unknown reaction   Benadryl [Diphenhydramine] Shortness Of Breath   Demerol [Meperidine] Other (See Comments)    Unknown reaction   Gabapentin Other (See Comments)    Unknown reaction   Meperidine Hcl Other (See Comments)    Unknown reaction   Mirtazapine Other (See Comments)    Unknown reaction   Olanzapine Other (See Comments)    Unknown reaction    Voltaren [Diclofenac Sodium] Shortness Of Breath   Zetia [Ezetimibe] Other (See Comments)    Weakness in legs, shakiness all over   Ativan [Lorazepam] Other (See  Comments)    Causes double vision at highter than .5 mg dose   Atorvastatin Other (See Comments)    Muscle aches and weakness   Budesonide-Formoterol Fumarate Other (See Comments)    Shakiness, tremors   Bupropion Hcl Other (See Comments)    "cloud over me" depression   Caffeine Other (See Comments)    jitters   Codeine Sulfate Other (See Comments)    Makes chest hurt like a heart attack   Lisinopril Cough   Metformin Nausea And Vomiting   Mometasone Furoate Nausea And Vomiting   Morphine Sulfate Other (See Comments)    Chest pain like a heart attack   Other Other (See Comments)    Beta Blockers, reaction shortness of breath   Oxycodone-Acetaminophen Nausea And Vomiting   Pioglitazone Other (See Comments)    Cannot take because of risk of bladder cancer   Propoxyphene N-Acetaminophen Nausea And Vomiting   Rosuvastatin Other (See Comments)    Muscle aches and weakness   Shellfish Allergy Diarrhea   Suvorexant Other (See Comments)    Jerking/nervous    Ticagrelor     Other reaction(s): Other (See Comments) "slowed heart rate" & chest pain  Tramadol Nausea Only   Trazodone And Nefazodone Nausea And Vomiting   Venlafaxine Other (See Comments)    Unknown reaction   Zolpidem Tartrate Other (See Comments)     Jittery, diarrhea   Latex Rash   Review of Systems  Unable to perform ROS: Other   Physical Exam Vitals and nursing note reviewed.  Constitutional:      General: She is not in acute distress.    Appearance: She is not ill-appearing.  HENT:     Head: Normocephalic and atraumatic.  Cardiovascular:     Rate and Rhythm: Normal rate.  Pulmonary:     Effort: Pulmonary effort is normal. No tachypnea.  Skin:    General: Skin is warm and dry.  Neurological:     Mental Status: She is alert and oriented to person, place, and time.  Psychiatric:        Mood and Affect: Mood normal.        Behavior: Behavior normal.    Vital Signs: BP 123/77 (BP Location: Left Arm)    Pulse 81   Temp 98.2 F (36.8 C) (Oral)   Resp 18   Ht '5\' 6"'$  (1.676 m)   Wt 92.9 kg   SpO2 98%   BMI 33.07 kg/m  Pain Scale: 0-10   Pain Score: 0-No pain   SpO2: SpO2: 98 % O2 Device:SpO2: 98 % O2 Flow Rate: .O2 Flow Rate (L/min): 2 L/min  IO: Intake/output summary:  Intake/Output Summary (Last 24 hours) at 02/10/2021 1228 Last data filed at 02/10/2021 1210 Gross per 24 hour  Intake 720 ml  Output 5500 ml  Net -4780 ml    LBM: Last BM Date: 02/08/21 Baseline Weight: Weight: 90.7 kg Most recent weight: Weight: 92.9 kg     Palliative Assessment/Data:   Flowsheet Rows    Flowsheet Row Most Recent Value  Intake Tab   Referral Department Hospitalist  Unit at Time of Referral Cardiac/Telemetry Unit  Palliative Care Primary Diagnosis Cardiac  Date Notified 02/09/21  Palliative Care Type New Palliative care  Reason for referral Clarify Goals of Care  Date of Admission 02/08/21  Date first seen by Palliative Care 02/10/21  # of days Palliative referral response time 1 Day(s)  # of days IP prior to Palliative referral 1  Clinical Assessment   Palliative Performance Scale Score 60%  Pain Max last 24 hours Not able to report  Pain Min Last 24 hours Not able to report  Dyspnea Max Last 24 Hours Not able to report  Dyspnea Min Last 24 hours Not able to report  Psychosocial & Spiritual Assessment   Palliative Care Outcomes        Time In: 0940 Time Out: 0950 Time Total: 70 minutes  Greater than 50%  of this time was spent counseling and coordinating care related to the above assessment and plan.  Signed by: Drue Novel, NP   Please contact Palliative Medicine Team phone at [phone] for questions and concerns.  For individual provider: See Shea Evans

## 2021-02-10 NOTE — Interval H&P Note (Signed)
History and Physical Interval Note:  02/10/2021 2:32 PM  [name]  has presented today for surgery, with the diagnosis of NSTEMI.  The various methods of treatment have been discussed with the patient and family. After consideration of risks, benefits and other options for treatment, the patient has consented to  Procedure(s): RIGHT/LEFT HEART CATH AND CORONARY/GRAFT ANGIOGRAPHY (N/A) as a surgical intervention.  The patient's history has been reviewed, patient examined, no change in status, stable for surgery.  I have reviewed the patient's chart and labs.  Questions were answered to the patient's satisfaction.     [place]

## 2021-02-10 NOTE — Progress Notes (Signed)
Pt complains of chest pressure/tightness that came on suddenly . EKG done. Provider [facility] notified. See new orders. Pt states that chest tightness has been slowly resolving and only lasted for a few minutes. When asked the location of pain in chest she points to her bilateral shoulder blades. Pt has been very anxious all night and states that she has not slept at all. PRN Xanax was given x2. Call bell within reach. Day shift nurse informed of the above events.    02/10/21 0645  Vitals  Temp 99.1 F (37.3 C)  Temp Source Oral  BP 137/71  BP Location Left Arm  BP Method Automatic  Patient Position (if appropriate) Lying  Pulse Rate 72  Pulse Rate Source Monitor  ECG Heart Rate 74  Resp 18  MEWS COLOR  MEWS Score Color Green  Oxygen Therapy  SpO2 98 %  O2 Device Nasal Cannula  O2 Flow Rate (L/min) 2 L/min

## 2021-02-10 NOTE — H&P (View-Only) (Signed)
[facility] Cardiology  CARDIOLOGY CONSULT NOTE  Patient ID: [name] MRN: [id number] DOB/AGE: 76-Feb-1947 76 y.o.  Admit date: 02/08/2021 Referring Physician Eugenie Norrie Primary Physician Rusty Aus, MD Primary Cardiologist Nehemiah Massed Reason for Consultation GI bleed, blood thinners  HPI:  [name] is a 75 year old female with history of coronary artery disease (CABG and AVR 2015, balloon angioplasty to proximal circumflex in 2017, DES to mid left circumflex and SVG to RCA in 11/14/2020), type 2 diabetes, hypertension, hyperlipidemia, prior GI bleeding following polypectomy in 2014 who was admitted with shortness of breath.  Interval history - Reportedly had episode of "anxiety" overnight with increased shortness of breath and bloody appearing sputum per report.  - Improved with ongoing diuresis.   Review of systems complete and found to be negative unless listed above     Past Medical History:  Diagnosis Date   1st degree AV block    ACE-inhibitor cough    Allergic rhinitis    Anemia    iron deficiency anemia   Anxiety    Aortic ectasia (HCC)    a. CT abd in 12/2016 incidentally noted aortic atherosclerosis and infrarenal abdominal aortic ectasia measuring as large as 2.7 cm with recommendation to repeat US in 2023.   Arthritis    Asthma    Cataract    Chronic depression    Chronic diastolic CHF (congestive heart failure) (HCC)    Chronic headache    COPD (chronic obstructive pulmonary disease) (HCC)    Coronary artery disease    a. DES to RCA and mid Cx 2009. b. CABG and bioprosthetic AVR May 2015. c. cutting balloon to prox Cx in 05/2016   Diabetes mellitus    type 2   Diverticulitis of colon    Essential hypertension    GERD (gastroesophageal reflux disease)    Hearing loss    History of blood transfusion 2013   History of prosthetic aortic valve replacement    HOH (hard of hearing)    Hypercholesterolemia    intolerance of statins and niaspan   IDA (iron deficiency  anemia) 02/03/2019   Mobitz type 1 second degree AV block    OSA (obstructive sleep apnea)    mild, intolerant of cpap   PAD (peripheral artery disease) ([place])    a. atherosclerosis by CT abd 12/2016 in [place].   PONV (postoperative nausea and vomiting)    Statin intolerance    Thyroid disease     Past Surgical History:  Procedure Laterality Date   ABDOMINAL HYSTERECTOMY     ABDOMINAL HYSTERECTOMY W/ PARTIAL VAGINACTOMY     AORTIC VALVE REPLACEMENT N/A 10/12/2013   Procedure: AORTIC VALVE REPLACEMENT (AVR);  Surgeon: [name], MD;  Location: [place];  Service: Open Heart Surgery;  Laterality: N/A;   APPENDECTOMY  1964   BARTHOLIN GLAND CYST EXCISION     BLADDER SUSPENSION     BREAST BIOPSY Bilateral 09/11/2000   neg   BREAST BIOPSY Left 07/24/2010   neg   BREAST CYST EXCISION  1988   bilateral nonmalignant tumors, x3   CARDIAC CATHETERIZATION     CARDIAC CATHETERIZATION N/A 05/25/2016   Procedure: Coronary Balloon Angioplasty;  Surgeon: Leonie Man, MD;  Location: [place] CV LAB;  Service: Cardiovascular;  Laterality: N/A;   CARDIAC CATHETERIZATION N/A 05/25/2016   Procedure: Coronary/Graft Angiography;  Surgeon: Leonie Man, MD;  Location: [place] CV LAB;  Service: Cardiovascular;  Laterality: N/A;   CATARACT EXTRACTION W/ INTRAOCULAR LENS  IMPLANT,  BILATERAL     CHOLECYSTECTOMY  2001   COLECTOMY     lap sigmoid   COLONOSCOPY  2014   polyps found, 2 clamped off.   CORONARY ANGIOPLASTY  10/29/2007   Prox RCA & Mid Cx.   CORONARY ARTERY BYPASS GRAFT N/A 10/12/2013   Procedure: CORONARY ARTERY BYPASS GRAFT TIMES TWO;  Surgeon: [name], MD;  Location: [place] OR;  Service: Open Heart Surgery;  Laterality: N/A;   CORONARY/GRAFT ANGIOGRAPHY N/A 09/20/2017   Procedure: CORONARY/GRAFT ANGIOGRAPHY;  Surgeon: Sherren Mocha, MD;  Location: [place] CV LAB;  Service: Cardiovascular;  Laterality: N/A;   LEFT HEART CATHETERIZATION WITH CORONARY ANGIOGRAM N/A 10/09/2013    Procedure: LEFT HEART CATHETERIZATION WITH CORONARY ANGIOGRAM;  Surgeon: [name], MD;  [facility] C[facility];  Service: Cardiovascular;  Laterality: N/A;   RIGHT/LEFT HEART CATH AND CORONARY ANGIOGRAPHY N/A 11/03/2020   Procedure: RIGHT/LEFT HEART CATH AND CORONARY ANGIOGRAPHY;  Surgeon: Corey Skains, MD;  Location: [place] [facility] LAB;  Service: Cardiovascular;  Laterality: N/A;   STERNAL WIRES REMOVAL N/A 04/13/2014   Procedure: STERNAL WIRES REMOVAL;  Surgeon: [name], MD;  Location: [place];  Service: Thoracic;  Laterality: N/A;   TEE WITHOUT CARDIOVERSION N/A 11/03/2020   Procedure: TRANSESOPHAGEAL ECHOCARDIOGRAM (TEE);  Surgeon: Corey Skains, MD;  Location: [facility] ORS;  Service: Cardiovascular;  Laterality: N/A;   THYROIDECTOMY     TONSILLECTOMY     TUBAL LIGATION     VAGINAL DELIVERY     3   VISCERAL ARTERY INTERVENTION N/A 08/16/2016   Procedure: Visceral Artery Intervention;  Surgeon: Algernon Huxley, MD;  Location: [place] CV LAB;  Service: Cardiovascular;  Laterality: N/A;    Medications Prior to Admission  Medication Sig Dispense Refill Last Dose   ANORO ELLIPTA 62.5-25 MCG/INH AEPB Inhale 1 puff into the lungs daily.   unknown at unknown   aspirin EC 81 MG EC tablet Take 1 tablet (81 mg total) by mouth daily. Swallow whole. 30 tablet 11 unknown at unknown   Calcium-Vitamin D (CALTRATE 600 PLUS-VIT D PO) Take 1 tablet by mouth 2 (two) times daily.   unknown at unknown   clopidogrel (PLAVIX) 75 MG tablet Take 75 mg by mouth daily.   unknown at unknown   DULoxetine (CYMBALTA) 20 MG capsule Take 1 capsule (20 mg total) by mouth in the morning.   unknown at unknown   FLUoxetine (PROZAC) 40 MG capsule Take 40 mg by mouth 2 (two) times daily.   unknown at unknown   hydrOXYzine (ATARAX/VISTARIL) 25 MG tablet Take 1 tablet (25 mg total) by mouth 3 (three) times daily. 90 tablet 0 unknown at unknown   insulin lispro (HUMALOG) 100 UNIT/ML injection Inject 0-54  Units into the skin 4 (four) times daily -  before meals and at bedtime.   unknown at unknown   insulin NPH-regular Human (NOVOLIN 70/30) (70-30) 100 UNIT/ML injection Inject 60 Units into the skin 2 (two) times daily with a meal. Inject 60u under the skin every morning at breakfast and inject 60u under the skin every evening with supper   unknown at unknown   isosorbide mononitrate (IMDUR) 30 MG 24 hr tablet Take 1 tablet (30 mg total) by mouth daily. 30 tablet 11 unknown at unknown   levothyroxine (SYNTHROID, LEVOTHROID) 125 MCG tablet Take 125 mcg by mouth daily before breakfast.   unknown at unknown   losartan (COZAAR) 25 MG tablet Take 1 tablet (25 mg total) by  mouth in the morning.   unknown at unknown   pantoprazole (PROTONIX) 40 MG tablet Take 40 mg by mouth in the morning.   unknown at unknown   potassium chloride (KLOR-CON) 10 MEQ tablet Take 1 tablet (10 mEq total) by mouth daily. 30 tablet 0    torsemide (DEMADEX) 20 MG tablet Take 20 mg by mouth daily.   unknown at unknown   traZODone (DESYREL) 100 MG tablet Take 200 mg by mouth at bedtime.   unknown at unknown   acetaminophen (TYLENOL) 500 MG tablet Take 500-1,000 mg by mouth every 6 (six) hours as needed (pain).   prn at prn   albuterol (PROVENTIL) (2.5 MG/3ML) 0.083% nebulizer solution Take 2.5 mg by nebulization every 4 (four) hours as needed for shortness of breath or wheezing.   prn at prn   insulin glargine (LANTUS) 100 UNIT/ML injection Inject 100 Units into the skin at bedtime. (Patient not taking: Reported on 02/08/2021)   Not Taking at unknown   nystatin (MYCOSTATIN) 100000 UNIT/ML suspension Take by mouth.   prn at prn   promethazine (PHENERGAN) 25 MG tablet Take 25 mg by mouth every 6 (six) hours as needed for nausea or vomiting.   prn at prn   Social History   Socioeconomic History   Marital status: Married    Spouse name: Not on file   Number of children: 3   Years of education: Not on file   Highest education level:  Not on file  Occupational History   Occupation: Retired    Fish farm manager: UNEMPLOYED    Comment: CNA  Tobacco Use   Smoking status: Former    Packs/day: 0.50    Years: 30.00    Pack years: 15.00    Types: Cigarettes    Quit date: 10/02/2013    Years since quitting: 7.3   Smokeless tobacco: Never  Vaping Use   Vaping Use: Never used  Substance and Sexual Activity   Alcohol use: No   Drug use: No   Sexual activity: Not Currently  Other Topics Concern   Not on file  Social History Narrative   Does not have Living Will   Desires CPR, would not want prolonged life support if futile.   Social Determinants of Health   Financial Resource Strain: Not on file  Food Insecurity: Not on file  Transportation Needs: Not on file  Physical Activity: Not on file  Stress: Not on file  Social Connections: Not on file  Intimate Partner Violence: Not on file    Family History  Problem Relation Age of Onset   Breast cancer Mother 19   Hypertension Father    Mesothelioma Father    Asthma Father    Stroke Paternal Grandfather    Heart disease Other    Breast cancer Maternal Aunt    Breast cancer Paternal Aunt       Review of systems complete and found to be negative unless listed above    PHYSICAL EXAM  General: Well developed, well nourished, appears anxious.  HEENT:  Normocephalic and atramatic Neck:  No JVD.  Lungs: Clear bilaterally to auscultation and percussion. Heart: HRRR . [id number] systolic murmur; no crisp closure sound.  Abdomen: Bowel sounds are positive, abdomen soft and non-tender  Msk:  Back normal, normal gait. Normal strength and tone for age. Extremities: No clubbing, cyanosis or edema.   Neuro: Alert and oriented X 3. Psych:  Good affect, responds appropriately  Labs:   Lab Results  Component Value  Date   WBC 14.4 (H) 02/09/2021   HGB 9.0 (L) 02/09/2021   HCT 26.3 (L) 02/09/2021   MCV 84.8 02/09/2021   PLT 196 02/09/2021    Recent Labs  Lab 02/09/21 0427   NA 130*  K 4.5  CL 93*  CO2 28  BUN 31*  CREATININE 1.27*  CALCIUM 7.9*  GLUCOSE 381*    Lab Results  Component Value Date   CKTOTAL 101 05/26/2016   CKMB 8.6 (H) 05/26/2016   TROPONINI <0.03 03/04/2018    Lab Results  Component Value Date   CHOL 173 09/20/2017   CHOL 206 (H) 03/16/2015   CHOL 220 (H) 07/07/2014   Lab Results  Component Value Date   HDL 22 (L) 09/20/2017   HDL 21 (L) 03/16/2015   HDL 30.20 (L) 07/07/2014   Lab Results  Component Value Date   LDLCALC 110 (H) 09/20/2017   LDLCALC 111 03/16/2015   LDLCALC 135 (H) 10/03/2013   Lab Results  Component Value Date   TRIG 207 (H) 09/20/2017   TRIG 369 (H) 03/16/2015   TRIG 329.0 (H) 07/07/2014   Lab Results  Component Value Date   CHOLHDL 7.9 09/20/2017   CHOLHDL 9.8 (H) 03/16/2015   CHOLHDL 7 07/07/2014   Lab Results  Component Value Date   LDLDIRECT 127.0 07/07/2014   LDLDIRECT 172.6 12/24/2008   LDLDIRECT 100.8 11/11/2007      Radiology: DG Chest 2 View  Result Date: 02/07/2021 CLINICAL DATA:  Shortness of breath.  Chest pain. EXAM: CHEST - 2 VIEW COMPARISON:  Most recent radiograph and CT 02/01/2021. FINDINGS: Stable prominent cardiac contours post aortic valve replacement and median sternotomy. Unchanged mediastinal contours. Chronic coarse lung markings. No confluent airspace disease, pleural effusion, or pneumothorax. Ground-glass airspace disease on prior CT are not well seen by radiograph. IMPRESSION: No acute chest finding.  Chronic coarse lung markings. Electronically Signed   By: Keith Rake M.D.   On: 02/07/2021 22:47   CT CHEST WO CONTRAST  Result Date: 02/01/2021 CLINICAL DATA:  COPD exacerbation shob and chest pain that started this arm. EXAM: CT CHEST WITHOUT CONTRAST TECHNIQUE: Multidetector CT imaging of the chest was performed following the standard protocol without IV contrast. COMPARISON:  CT angio chest 08/08/2020, CT angiography chest 06/15/2020 FINDINGS: Cardiovascular:  Normal heart size. Status post aortic valve replacement. No significant pericardial effusion. The thoracic aorta is normal in caliber. At least moderate atherosclerotic plaque of the thoracic aorta. Four-vessel coronary artery calcifications status post coronary artery stents. The main pulmonary artery is enlarged in caliber measuring up to 3.5 cm. Mediastinum/Nodes: No gross hilar adenopathy, noting limited sensitivity for the detection of hilar adenopathy on this noncontrast study. No enlarged mediastinal or axillary lymph nodes. Nonspecific debris noted within the esophagus. The thyroid and trachea demonstrate no significant findings. Lungs/Pleura: No focal consolidation. Interval decrease in almost resolved patchy ground-glass airspace opacities with a persistent peribronchovascular nodular-like 1 cm right upper lobe lesion (3:54, 5:78). Interval development of a 1.3 cm ground-glass airspace opacity at the right apex (3:17, 5:104). Trace persistent interlobular septal wall thickening. No pulmonary mass. No pleural effusion. No pneumothorax. Upper Abdomen: Status post cholecystectomy.  No acute abnormality. Musculoskeletal: No chest wall abnormality. No suspicious lytic or blastic osseous lesions. No acute displaced fracture. Sternotomy wires are noted. IMPRESSION: 1. Interval development of a 1.3 cm ground-glass airspace opacity at the right apex. Persistent peribronchovascular nodular like 1 cm right upper lobe ground-glass airspace opacity. Adenocarcinoma cannot be excluded. Follow up  by CT is recommended in 12 months, with continued annual surveillance for a minimum of 3 years. These recommendations are taken from: Recommendations for the Management of Subsolid Pulmonary Nodules Detected at CT: A Statement from the [facility] 2013; 266:1, 304-317. 2. Enlarged main pulmonary artery suggestive of hypertension. 3. Debris noted within the esophagus. 4. Aortic Atherosclerosis (ICD10-I70.0)  including four-vessel coronary artery calcifications status post stent. 5. Status aortic valve replacement. Electronically Signed   By: Iven Finn M.D.   On: 02/01/2021 17:29   CT Angio Chest PE W and/or Wo Contrast  Result Date: 02/08/2021 CLINICAL DATA:  PE suspected, high prob. Dyspnea. Nonproductive cough. EXAM: CT ANGIOGRAPHY CHEST WITH CONTRAST TECHNIQUE: Multidetector CT imaging of the chest was performed using the standard protocol during bolus administration of intravenous contrast. Multiplanar CT image reconstructions and MIPs were obtained to evaluate the vascular anatomy. CONTRAST:  23m OMNIPAQUE IOHEXOL 350 MG/ML SOLN COMPARISON:  02/01/2021 FINDINGS: Cardiovascular: There is adequate opacification of the pulmonary arterial tree. No intraluminal filling defect through the segmental level to suggest acute pulmonary embolism. The central pulmonary arteries are enlarged in keeping with changes of pulmonary arterial hypertension. Extensive multi-vessel coronary artery calcification is noted. Probable stenting of the proximal right and left circumflex coronary arteries. Coronary artery bypass grafting and aortic valve replacement has been performed. Mild global cardiomegaly is present. Moderate largely noncalcified atheromatous plaque seen throughout the aorta. No aortic aneurysm. Mediastinum/Nodes: No pathologic thoracic adenopathy. The esophagus is unremarkable. Lungs/Pleura: Imaging is slightly limited by motion artifact. There is bilateral asymmetric patchy ground-glass pulmonary infiltrate more focal within the mid lung zones bilaterally possibly infectious or inflammatory in nature as can be seen with COVID-19 pneumonia. This has progressed since prior examination. No pneumothorax or pleural effusion. Central airways are widely patent. Upper Abdomen: Cholecystectomy has been performed. No acute abnormality. Musculoskeletal: No acute bone abnormality. No lytic or blastic bone lesion. Review of  the MIP images confirms the above findings. IMPRESSION: No pulmonary embolism. Mild cardiomegaly. Morphologic changes in keeping with pulmonary arterial hypertension. Progressive patchy bilateral mid lung zone ground-glass pulmonary infiltrate, possibly infectious or inflammatory in nature. Serologic correlation for COVID-19 pneumonia may be helpful. Aortic Atherosclerosis (ICD10-I70.0). Electronically Signed   By: AFidela SalisburyM.D.   On: 02/08/2021 02:55   NM Pulmonary Perfusion  Result Date: 01/30/2021 CLINICAL DATA:  Shortness of breath EXAM: NUCLEAR MEDICINE PERFUSION LUNG SCAN TECHNIQUE: Perfusion images were obtained in multiple projections after intravenous injection of radiopharmaceutical. Ventilation scans intentionally deferred if perfusion scan and chest x-ray adequate for interpretation during COVID 19 epidemic. RADIOPHARMACEUTICALS:  4 mCi Tc-952mAA IV COMPARISON:  01/28/2021 FINDINGS: There is adequate uptake of radioactive tracer throughout both lungs. No focal defect to suggest pulmonary embolism is noted. Cardiac shadow is enlarged. IMPRESSION: No focal defect to suggest pulmonary embolism. Electronically Signed   By: MaInez Catalina.D.   On: 01/30/2021 16:07   DG Chest Port 1 View  Result Date: 02/09/2021 CLINICAL DATA:  Bilateral pulmonary infiltrates on CXR EXAM: PORTABLE CHEST - 1 VIEW COMPARISON:  CT from previous day FINDINGS: Coarse nodular airspace opacities throughout both lung fields as before. No pleural effusion. No pneumothorax. Heart size upper limits normal.  Right hilar fullness. Sternotomy wires. Visualized bones unremarkable. IMPRESSION: 1. Persistent coarse nodular bilateral airspace opacities. 2. Right hilar fullness presumably related to regional airspace disease. Electronically Signed   By: D Lucrezia Europe.D.   On: 02/09/2021 08:00   [facility]  Result Date: 02/01/2021 CLINICAL DATA:  75 year old female with history of shortness of breath. COPD. EXAM:  PORTABLE CHEST 1 VIEW COMPARISON:  Chest x-ray 01/30/2021. FINDINGS: Lung volumes are normal. No consolidative airspace disease. No pleural effusions. Diffuse interstitial prominence and widespread peribronchial cuffing. No pneumothorax. No definite suspicious appearing pulmonary nodules or masses are noted. No evidence of pulmonary edema. Mild cardiomegaly. Upper mediastinal contours are within normal limits. Status post hemi median sternotomy for aortic valve replacement with what appears to be a stented bioprosthesis. IMPRESSION: 1. Diffuse interstitial prominence and peribronchial cuffing similar to prior studies suggestive of probable chronic bronchitis. 2. Cardiomegaly. Electronically Signed   By: Vinnie Langton M.D.   On: 02/01/2021 10:49   DG Chest Port 1 View  Result Date: 01/30/2021 CLINICAL DATA:  Post VQ scan EXAM: PORTABLE CHEST 1 VIEW COMPARISON:  01/28/2021, 11/24/2020, 03/04/2018 FINDINGS: Post sternotomy changes with valve replacement. Cardiomegaly with mild vascular congestion. Diffuse interstitial opacities some of which is felt due to chronic disease. No pleural effusion or pneumothorax. IMPRESSION: 1. Cardiomegaly with slight central congestion and chronic interstitial opacity. Electronically Signed   By: Donavan Foil M.D.   On: 01/30/2021 16:34   DG Chest Port 1 View  Result Date: 01/28/2021 CLINICAL DATA:  Difficulty breathing.  Shortness of breath. EXAM: PORTABLE CHEST 1 VIEW COMPARISON:  November 24, 2020 FINDINGS: Stable cardiomegaly. The hila and mediastinum are normal. No pneumothorax. No nodules or masses. No focal infiltrates. Increased interstitial opacities in the lungs suggesting mild edema. IMPRESSION: Cardiomegaly and mild pulmonary edema. No other acute abnormalities. Electronically Signed   By: Dorise Bullion III M.D.   On: 01/28/2021 19:17   ECHOCARDIOGRAM COMPLETE  Result Date: 02/02/2021    ECHOCARDIOGRAM REPORT   Patient Name:   [name] Date of Exam: 02/02/2021  Medical Rec #:  [id number]     Height:       66.0 in Accession #:    [id number]    Weight:       203.0 lb Date of Birth:  1945-09-27      BSA:          2.012 m Patient Age:    70 years      BP:           144/61 mmHg Patient Gender: F             HR:           74 bpm. Exam Location:  [facility] Procedure: 2D Echo, Color Doppler and Cardiac Doppler Indications:     Chest pain R07.9  History:         Patient has prior history of Echocardiogram examinations, most                  recent 11/25/2020. Prior CABG, COPD; Risk Factors:Hypertension.  Sonographer:     Sherrie Sport Referring Phys:  [id number] SARA-MAIZ A THOMAS Diagnosing Phys: Serafina Royals MD  Sonographer Comments: Technically challenging study due to limited acoustic windows, suboptimal parasternal window and no apical window. Image acquisition challenging due to COPD. IMPRESSIONS  1. Left ventricular ejection fraction, by estimation, is 55 to 60%. The left ventricle has normal function. The left ventricle has no regional wall motion abnormalities. Left ventricular diastolic parameters were normal.  2. Right ventricular systolic function is normal. The right ventricular size is normal.  3. The mitral valve is normal in structure. Mild mitral valve regurgitation. Moderate mitral annular calcification.  4. The aortic valve is normal  in structure. Aortic valve regurgitation is not visualized. FINDINGS  Left Ventricle: Left ventricular ejection fraction, by estimation, is 55 to 60%. The left ventricle has normal function. The left ventricle has no regional wall motion abnormalities. The left ventricular internal cavity size was normal in size. There is  no left ventricular hypertrophy. Left ventricular diastolic parameters were normal. Right Ventricle: The right ventricular size is normal. No increase in right ventricular wall thickness. Right ventricular systolic function is normal. Left Atrium: Left atrial size was normal in size. Right Atrium: Right atrial size was normal  in size. Pericardium: There is no evidence of pericardial effusion. Mitral Valve: The mitral valve is normal in structure. Moderate mitral annular calcification. Mild mitral valve regurgitation. Tricuspid Valve: The tricuspid valve is normal in structure. Tricuspid valve regurgitation is mild. Aortic Valve: The aortic valve is normal in structure. Aortic valve regurgitation is not visualized. Pulmonic Valve: The pulmonic valve was normal in structure. Pulmonic valve regurgitation is not visualized. Aorta: The aortic root and ascending aorta are structurally normal, with no evidence of dilitation. IAS/Shunts: No atrial level shunt detected by color flow Doppler.  LEFT VENTRICLE PLAX 2D LVIDd:         4.40 cm LVIDs:         3.10 cm LV PW:         1.50 cm LV IVS:        1.80 cm LVOT diam:     2.10 cm LVOT Area:     3.46 cm  LEFT ATRIUM         Index LA diam:    4.70 cm 2.34 cm/m                        PULMONIC VALVE AORTA                 PV Vmax:        0.84 m/s Ao Root diam: 3.10 cm PV Peak grad:   2.8 mmHg                       RVOT Peak grad: 5 mmHg   SHUNTS Systemic Diam: 2.10 cm Serafina Royals MD Electronically signed by Serafina Royals MD Signature Date/Time: 02/02/2021/4:41:25 PM    Final     EKG: Sinus rhythm with LVH.  Lateral ST changes.  Cath 11/14/20- Three vessel coronary artery disease.  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  # CAD s/p PCI to SVG and Lcx 11/14/20 #  Elevated troponin The patient is status post CABG and AVR in 2015 and recently underwent stent placement to an SVG and left circumflex in June 2022.  She is not currently having any anginal symptoms.  During this admission she has had no apparent change in symptoms aside from shortness of breath and her troponin has gone up to as high as 1300. She may have an unstable coronary lesion or worsening prosthetic valve stenosis. Consider also demand in the setting of COPD, though I think this requires additional investigation.  -Plavix and aspirin resumed -reasonable to discharge the patient on a p.o. PPI - Will plan for right and left heart catheterization today to evaluate coronary anatomy and for worsening valve stenosis.    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  She is also being treated for COPD exacerbation.  Overall I feel that her symptoms are multifactorial, but it is difficult to eliminate the possibility of worsening aortic valve stenosis. -Agree with lasix 40 mg IV BID -Continue losartan -Continue COPD treatment per the pulmonary team. - Right and left heart cath today.   # Anemia In the presence of aortic valve stenosis could consider hemolysis and/or GI bleeding from AVMs slowly contributing to her anemia. -We will consider sending hemolysis labs.  Signed: Andrez Grime MD 02/10/2021,  7:37 AM

## 2021-02-11 DIAGNOSIS — J441 Chronic obstructive pulmonary disease with (acute) exacerbation: Secondary | ICD-10-CM | POA: Diagnosis not present

## 2021-02-11 DIAGNOSIS — J9611 Chronic respiratory failure with hypoxia: Secondary | ICD-10-CM | POA: Diagnosis not present

## 2021-02-11 DIAGNOSIS — R079 Chest pain, unspecified: Secondary | ICD-10-CM | POA: Diagnosis not present

## 2021-02-11 LAB — BASIC METABOLIC PANEL
Anion gap: 6 (ref 5–15)
BUN: 35 mg/dL — ABNORMAL HIGH (ref 8–23)
CO2: 34 mmol/L — ABNORMAL HIGH (ref 22–32)
Calcium: 8.2 mg/dL — ABNORMAL LOW (ref 8.9–10.3)
Chloride: 93 mmol/L — ABNORMAL LOW (ref 98–111)
Creatinine, Ser: 1.28 mg/dL — ABNORMAL HIGH (ref 0.44–1.00)
GFR, Estimated: 43 mL/min — ABNORMAL LOW (ref >60)
Glucose, Bld: 257 mg/dL — ABNORMAL HIGH (ref 70–99)
Potassium: 5.5 mmol/L — ABNORMAL HIGH (ref 3.5–5.1)
Sodium: 133 mmol/L — ABNORMAL LOW (ref 135–145)

## 2021-02-11 LAB — CBC WITH DIFFERENTIAL/PLATELET
Abs Immature Granulocytes: 0.08 10*3/uL — ABNORMAL HIGH (ref 0.00–0.07)
Basophils Absolute: 0.1 10*3/uL (ref 0.0–0.1)
Basophils Relative: 0 %
Eosinophils Absolute: 0.1 10*3/uL (ref 0.0–0.5)
Eosinophils Relative: 1 %
HCT: 32.9 % — ABNORMAL LOW (ref 36.0–46.0)
Hemoglobin: 11.2 g/dL — ABNORMAL LOW (ref 12.0–15.0)
Immature Granulocytes: 1 %
Lymphocytes Relative: 9 %
Lymphs Abs: 1.3 10*3/uL (ref 0.7–4.0)
MCH: 29.6 pg (ref 26.0–34.0)
MCHC: 34 g/dL (ref 30.0–36.0)
MCV: 86.8 fL (ref 80.0–100.0)
Monocytes Absolute: 0.8 10*3/uL (ref 0.1–1.0)
Monocytes Relative: 5 %
Neutro Abs: 12 10*3/uL — ABNORMAL HIGH (ref 1.7–7.7)
Neutrophils Relative %: 84 %
Platelets: 241 10*3/uL (ref 150–400)
RBC: 3.79 MIL/uL — ABNORMAL LOW (ref 3.87–5.11)
RDW: 13.7 % (ref 11.5–15.5)
WBC: 14.3 10*3/uL — ABNORMAL HIGH (ref 4.0–10.5)
nRBC: 0 % (ref 0.0–0.2)

## 2021-02-11 LAB — GLUCOSE, CAPILLARY
Glucose-Capillary: 249 mg/dL — ABNORMAL HIGH (ref 70–99)
Glucose-Capillary: 384 mg/dL — ABNORMAL HIGH (ref 70–99)
Glucose-Capillary: 307 mg/dL — ABNORMAL HIGH (ref 70–99)
Glucose-Capillary: 287 mg/dL — ABNORMAL HIGH (ref 70–99)

## 2021-02-11 LAB — MAGNESIUM: Magnesium: 2.2 mg/dL (ref 1.7–2.4)

## 2021-02-11 NOTE — Progress Notes (Signed)
Patient ID: [name], female   DOB: March 08, 1946, 75 y.o.   MRN: [id number]  PROGRESS NOTE    [name]  [id number] DOB: 1945-09-28 DOA: 02/08/2021 PCP: Rusty Aus, MD   Brief Narrative:  75 year old female with history of asthma/COPD, CAD status post CABG and PCI, diabetes mellitus type 2, GERD, headache, anxiety, recent 2 hospitalizations for COPD exacerbation treated with steroids presented again with worsening shortness of breath.  On presentation, high-sensitivity troponin was 101 113 with BNP of 423.  Stool Hemoccult was positive.  Chest x-ray showed chronic coarse lung markings with no acute chest findings.  She was treated with IV Lasix and Solu-Medrol.  Cardiology/GI/pulmonary were consulted.  Assessment & Plan:   Possible COPD exacerbation Chronic respiratory failure with hypoxia -CTA chest showed no pulmonary embolism but showed progressive patchy bilateral mid lung zone groundglass pulmonary infiltrate, possibly infectious or inflammatory in nature.  COVID-19 test was negative.  Pulmonary following.  IV Solu-Medrol has been switched to tapering Medrol dose pack by pulmonary on 02/09/2021.  Continue nebs -Patient on 3 L oxygen by nasal cannula at home.  Currently on 2 L oxygen.  Chest tightness/positive troponin in patient with CAD status post PCI and CABG and AVR -Cardiology following.  High-sensitivity troponins peaked at 1360 on 02/09/2021.  -Status post cardiac catheterization on 02/10/2021 by cardiology which showed severe native three-vessel CAD with patent grafts.  Unable to cross the aortic valve during the procedure; cardiology planning to perform TEE next week.  Continue aspirin and Plavix.    Acute pulmonary edema -IV Lasix has been switched to oral Lasix and spironolactone by pulmonary on 02/09/2021.  Strict input and output.  Daily weights.  Fluid restriction. -Hold spironolactone today because of hyperkalemia.  Hyperkalemia -Hold spironolactone.  DC potassium  supplement.  Repeat a.m. labs.  Chronically disease stage IIIb -Creatinine stable.  Monitor  Anemia of chronic disease Fecal occult blood positive in stool -Patient has chronic anemia with only mild drop in hemoglobin.  No evidence of overt GI bleeding.  GI consult appreciated.  No need for inpatient procedures.  Antiplatelets have been resumed. -Hemoglobin 11.2 today.  Leukocytosis -Improving.  Monitor  Hyponatremia -Monitor.  Improving.  Diabetes mellitus type 2 with hyperglycemia -Hyperglycemia aggravated by steroid use.  Continue NPH along with CBGs with SSI.  Depression -Continue Prozac and Cymbalta.  Low-dose Xanax has been started by pulmonary which will be continued.  Psychiatry consultation appreciated: Continue current regimen.  Will need outpatient follow-up with PCP/psychiatry.    Generalized deconditioning -Overall prognosis is guarded to poor.   -Palliative care consultation appreciated: CODE STATUS has been changed to DNR -PT recommends home health PT  Hypertension -Blood pressure stable.  Continue Lasix.  Hypothyroidism--continue Synthroid   DVT prophylaxis: SCDs Code Status: Full Family Communication: Husband at bedside on 02/11/2021. Disposition Plan: Status is: Inpatient  Remains inpatient appropriate because:Inpatient level of care appropriate due to severity of illness  Dispo: The patient is from: Home              Anticipated d/c is to: Home              Patient currently is not medically stable to d/c.   Difficult to place patient No   Consultants: Cardiology/GI/pulmonary  Procedures: None  Antimicrobials: None   Subjective: Patient seen and examined at bedside.  Poor historian.  Denies current chest pain, nausea or vomiting.  No fever reported.   Objective: Vitals:   02/11/21 0023 02/11/21  0309 02/11/21 0410 02/11/21 0744  BP: (!) 106/53  (!) 141/56 122/71  Pulse: 91  72 70  Resp: (!) '21  18 19  '$ Temp: 97.6 F (36.4 C)  98 F (36.7  C) 98.1 F (36.7 C)  TempSrc: Oral   Oral  SpO2: 97%  98% 98%  Weight:  97.3 kg    Height:        Intake/Output Summary (Last 24 hours) at 02/11/2021 0746 Last data filed at 02/11/2021 0745 Gross per 24 hour  Intake 960 ml  Output 4500 ml  Net -3540 ml    Filed Weights   02/08/21 1206 02/10/21 0055 02/11/21 0309  Weight: 97.1 kg 92.9 kg 97.3 kg    Examination:  General exam: Currently on 2 L oxygen by nasal cannula.  No acute distress.  Looks chronically ill and deconditioned.  Hard of hearing Respiratory system: Bilateral decreased breath sounds at bases with scattered crackles  cardiovascular system: S1-S2 heard; rate controlled  gastrointestinal system: Abdomen is slightly distended, soft and nontender.  Normal bowel sounds heard  extremities: No cyanosis; mild lower extremity edema present  Central nervous system: Slow to respond; alert.  Poor historian.  No focal neurological deficits.  Moving extremities. Skin: No obvious rashes/petechiae  psychiatry: Intermittently gets anxious; otherwise mostly flat affect  Data Reviewed: I have personally reviewed following labs and imaging studies  CBC: Recent Labs  Lab 02/07/21 2306 02/08/21 0630 02/09/21 0427 02/10/21 0731 02/11/21 0553  WBC 10.4 9.4 14.4* 16.3* 14.3*  NEUTROABS  --   --  13.1* 13.7* 12.0*  HGB 8.9* 8.9* 9.0* 9.8* 11.2*  HCT 26.6* 27.4* 26.3* 29.0* 32.9*  MCV 86.6 86.2 84.8 84.5 86.8  PLT 167 171 196 211 [id number]    Basic Metabolic Panel: Recent Labs  Lab 02/07/21 2306 02/08/21 0630 02/09/21 0427 02/10/21 0731 02/11/21 0553  NA 131* 131* 130* 127* 133*  K 5.3* 4.7 4.5 5.0 5.5*  CL 100 96* 93* 88* 93*  CO2 '25 25 28 31 '$ 34*  GLUCOSE 233* 429* 381* 396* 257*  BUN 17 20 31* 34* 35*  CREATININE 1.04* 1.22* 1.27* 1.14* 1.28*  CALCIUM 8.0* 7.9* 7.9* 8.4* 8.2*  MG  --   --  1.8 2.1 2.2    GFR: Estimated Creatinine Clearance: 44 mL/min (A) (by C-G formula based on SCr of 1.28 mg/dL (H)). Liver  Function Tests: No results for input(s): AST, ALT, ALKPHOS, BILITOT, PROT, ALBUMIN in the last 168 hours. No results for input(s): LIPASE, AMYLASE in the last 168 hours. No results for input(s): AMMONIA in the last 168 hours. Coagulation Profile: No results for input(s): INR, PROTIME in the last 168 hours. Cardiac Enzymes: No results for input(s): CKTOTAL, CKMB, CKMBINDEX, TROPONINI in the last 168 hours. BNP (last 3 results) No results for input(s): PROBNP in the last 8760 hours. HbA1C: No results for input(s): HGBA1C in the last 72 hours.  CBG: Recent Labs  Lab 02/10/21 1208 02/10/21 1405 02/10/21 1731 02/10/21 2145 02/11/21 0724  GLUCAP 187* 123* 126* 186* 249*    Lipid Profile: No results for input(s): CHOL, HDL, LDLCALC, TRIG, CHOLHDL, LDLDIRECT in the last 72 hours. Thyroid Function Tests: No results for input(s): TSH, T4TOTAL, FREET4, T3FREE, THYROIDAB in the last 72 hours. Anemia Panel: No results for input(s): VITAMINB12, FOLATE, FERRITIN, TIBC, IRON, RETICCTPCT in the last 72 hours. Sepsis Labs: No results for input(s): PROCALCITON, LATICACIDVEN in the last 168 hours.  Recent Results (from the past 240 hour(s))  Resp Panel by  RT-PCR (Flu A&B, Covid) Nasopharyngeal Swab     Status: None   Collection Time: 02/01/21 12:10 PM   Specimen: Nasopharyngeal Swab; Nasopharyngeal(NP) swabs in vial transport medium  Result Value Ref Range Status   SARS Coronavirus 2 by RT PCR NEGATIVE NEGATIVE Final    Comment: (NOTE) SARS-CoV-2 target nucleic acids are NOT DETECTED.  The SARS-CoV-2 RNA is generally detectable in upper respiratory specimens during the acute phase of infection. The lowest concentration of SARS-CoV-2 viral copies this assay can detect is 138 copies/mL. A negative result does not preclude SARS-Cov-2 infection and should not be used as the sole basis for treatment or other patient management decisions. A negative result may occur with  improper specimen  collection/handling, submission of specimen other than nasopharyngeal swab, presence of viral mutation(s) within the areas targeted by this assay, and inadequate number of viral copies(<138 copies/mL). A negative result must be combined with clinical observations, patient history, and epidemiological information. The expected result is Negative.  Fact Sheet for Patients:  EntrepreneurPulse.com.au  Fact Sheet for Healthcare Providers:  IncredibleEmployment.be  This test is no t yet approved or cleared by the [place] FDA and  has been authorized for detection and/or diagnosis of SARS-CoV-2 by FDA under an Emergency Use Authorization (EUA). This EUA will remain  in effect (meaning this test can be used) for the duration of the COVID-19 declaration under Section 564(b)(1) of the Act, 21 U.S.C.section 360bbb-3(b)(1), unless the authorization is terminated  or revoked sooner.       Influenza A by PCR NEGATIVE NEGATIVE Final   Influenza B by PCR NEGATIVE NEGATIVE Final    Comment: (NOTE) The Xpert Xpress SARS-CoV-2/FLU/RSV plus assay is intended as an aid in the diagnosis of influenza from Nasopharyngeal swab specimens and should not be used as a sole basis for treatment. Nasal washings and aspirates are unacceptable for Xpert Xpress SARS-CoV-2/FLU/RSV testing.  Fact Sheet for Patients: EntrepreneurPulse.com.au  Fact Sheet for Healthcare Providers: IncredibleEmployment.be  This test is not yet approved or cleared by the [place] FDA and has been authorized for detection and/or diagnosis of SARS-CoV-2 by FDA under an Emergency Use Authorization (EUA). This EUA will remain in effect (meaning this test can be used) for the duration of the COVID-19 declaration under Section 564(b)(1) of the Act, 21 U.S.C. section 360bbb-3(b)(1), unless the authorization is terminated or revoked.  Performed at [facility], [address] [place], [place] [address]   Urine Culture     Status: Abnormal   Collection Time: 02/01/21 10:20 PM   Specimen: Urine, Random  Result Value Ref Range Status   Specimen Description   Final    URINE, RANDOM Performed at [facility], [address] [place], [place] [address]    Special Requests   Final    NONE Performed at [facility], [place], [place] [address]    Culture (A)  Final    >=100,000 COLONIES/mL KLEBSIELLA PNEUMONIAE 50,000 COLONIES/mL ESCHERICHIA COLI    Report Status 02/05/2021 FINAL  Final   Organism ID, Bacteria KLEBSIELLA PNEUMONIAE (A)  Final   Organism ID, Bacteria ESCHERICHIA COLI (A)  Final      Susceptibility   Escherichia coli - MIC*    AMPICILLIN 4 SENSITIVE Sensitive     CEFAZOLIN <=4 SENSITIVE Sensitive     CEFEPIME <=0.12 SENSITIVE Sensitive     CEFTRIAXONE <=0.25 SENSITIVE Sensitive     CIPROFLOXACIN <=0.25 SENSITIVE Sensitive     GENTAMICIN <=1  SENSITIVE Sensitive     IMIPENEM <=0.25 SENSITIVE Sensitive     NITROFURANTOIN <=16 SENSITIVE Sensitive     TRIMETH/SULFA <=20 SENSITIVE Sensitive     AMPICILLIN/SULBACTAM <=2 SENSITIVE Sensitive     PIP/TAZO <=4 SENSITIVE Sensitive     * 50,000 COLONIES/mL ESCHERICHIA COLI   Klebsiella pneumoniae - MIC*    AMPICILLIN RESISTANT Resistant     CEFAZOLIN <=4 SENSITIVE Sensitive     CEFEPIME <=0.12 SENSITIVE Sensitive     CEFTRIAXONE <=0.25 SENSITIVE Sensitive     CIPROFLOXACIN <=0.25 SENSITIVE Sensitive     GENTAMICIN <=1 SENSITIVE Sensitive     IMIPENEM <=0.25 SENSITIVE Sensitive     NITROFURANTOIN <=16 SENSITIVE Sensitive     TRIMETH/SULFA <=20 SENSITIVE Sensitive     AMPICILLIN/SULBACTAM 4 SENSITIVE Sensitive     PIP/TAZO <=4 SENSITIVE Sensitive     * >=100,000 COLONIES/mL KLEBSIELLA PNEUMONIAE  Resp Panel by RT-PCR (Flu A&B, Covid) Nasopharyngeal Swab     Status: None   Collection Time: 02/08/21  2:00 AM   Specimen:  Nasopharyngeal Swab; Nasopharyngeal(NP) swabs in vial transport medium  Result Value Ref Range Status   SARS Coronavirus 2 by RT PCR NEGATIVE NEGATIVE Final    Comment: (NOTE) SARS-CoV-2 target nucleic acids are NOT DETECTED.  The SARS-CoV-2 RNA is generally detectable in upper respiratory specimens during the acute phase of infection. The lowest concentration of SARS-CoV-2 viral copies this assay can detect is 138 copies/mL. A negative result does not preclude SARS-Cov-2 infection and should not be used as the sole basis for treatment or other patient management decisions. A negative result may occur with  improper specimen collection/handling, submission of specimen other than nasopharyngeal swab, presence of viral mutation(s) within the areas targeted by this assay, and inadequate number of viral copies(<138 copies/mL). A negative result must be combined with clinical observations, patient history, and epidemiological information. The expected result is Negative.  Fact Sheet for Patients:  EntrepreneurPulse.com.au  Fact Sheet for Healthcare Providers:  IncredibleEmployment.be  This test is no t yet approved or cleared by the [place] FDA and  has been authorized for detection and/or diagnosis of SARS-CoV-2 by FDA under an Emergency Use Authorization (EUA). This EUA will remain  in effect (meaning this test can be used) for the duration of the COVID-19 declaration under Section 564(b)(1) of the Act, 21 U.S.C.section 360bbb-3(b)(1), unless the authorization is terminated  or revoked sooner.       Influenza A by PCR NEGATIVE NEGATIVE Final   Influenza B by PCR NEGATIVE NEGATIVE Final    Comment: (NOTE) The Xpert Xpress SARS-CoV-2/FLU/RSV plus assay is intended as an aid in the diagnosis of influenza from Nasopharyngeal swab specimens and should not be used as a sole basis for treatment. Nasal washings and aspirates are unacceptable for  Xpert Xpress SARS-CoV-2/FLU/RSV testing.  Fact Sheet for Patients: EntrepreneurPulse.com.au  Fact Sheet for Healthcare Providers: IncredibleEmployment.be  This test is not yet approved or cleared by the [place] FDA and has been authorized for detection and/or diagnosis of SARS-CoV-2 by FDA under an Emergency Use Authorization (EUA). This EUA will remain in effect (meaning this test can be used) for the duration of the COVID-19 declaration under Section 564(b)(1) of the Act, 21 U.S.C. section 360bbb-3(b)(1), unless the authorization is terminated or revoked.  Performed at [facility], [address], [place], [place] [address]           Radiology Studies: CARDIAC CATHETERIZATION  Result Date: 02/10/2021   Mid LAD lesion  is 100% stenosed.   Ost RCA lesion is 99% stenosed.   Ost RCA to Prox RCA lesion is 20% stenosed.   Prox RCA lesion is 80% stenosed.   Mid RCA lesion is 100% stenosed.   Prox Cx to Mid Cx lesion is 50% stenosed.   Previously placed Origin to Prox Graft stent (unknown type) is  widely patent.   Previously placed Dist Graft to Insertion stent (unknown type) is  widely patent.   Previously placed Prox Cx stent (unknown type) is  widely patent.   SVG and is large.   LIMA and is normal in caliber and large. Patient agitation and frequent movement made the procedure technically difficult. Conclusion: Severe native three-vessel coronary artery disease including CTO of the mid LAD, CTO of the mid RCA, and patent stent in the mid left circumflex. Patent SVG to RCA with 2 patent stents in the proximal and distal segments. Patent LIMA to LAD RA 6, RV 38/5, PA 37/16 (25), PCWP 15 Fick CO equals 3.91, Fick CI 1.93 Recommendation Unable to cross the aortic valve during this procedure.  She needs additional evaluation to determine the severity of her aortic valve stenosis.  Likely we will perform a TEE next week. She has been  appropriately diuresed with a RA pressure of 6 and pulmonary capillary wedge pressure 15.  I have ordered her home torsemide of 20 mg daily to be started tomorrow. Continue aspirin and Plavix as previously prescribed for stent placed in June 2022        Scheduled Meds:  aspirin EC  81 mg Oral Daily   clopidogrel  75 mg Oral Daily   DULoxetine  20 mg Oral q AM   FLUoxetine  40 mg Oral BID   guaiFENesin  600 mg Oral BID   insulin aspart  0-20 Units Subcutaneous TID WC   insulin aspart  0-5 Units Subcutaneous QHS   insulin aspart protamine- aspart  70 Units Subcutaneous BID WC   levothyroxine  125 mcg Oral Q0600   methylPREDNISolone  4 mg Oral 4X daily taper   pantoprazole  40 mg Oral Daily   potassium chloride  10 mEq Oral Daily   sodium chloride flush  3 mL Intravenous Q12H   sodium chloride flush  3 mL Intravenous Q12H   spironolactone  25 mg Oral Daily   torsemide  20 mg Oral Daily   traZODone  200 mg Oral QHS   Continuous Infusions:  sodium chloride            Aline August, MD [facility] 02/11/2021, 7:46 AM

## 2021-02-11 NOTE — Progress Notes (Signed)
Physical Therapy Treatment Patient Details Name: [name] MRN: [id number] DOB: July 21, 1945 Today's Date: 02/11/2021    History of Present Illness 75 y.o. female presenting to hospital with acute worsening of SOB, admitted with similar 8/27.  Pt admitted with COPD exacerbation complicated by anxiety.  PMH includes dCHF, COPD, DM, chronic respiratory failure on 3 L home O2, anemia, anxiety, HOH, OSA, NSTEMI, dizziness, AVR, and CABG.    PT Comments    Pt reluctant, but willing to undergo PT this PM.  Pt agreed to bed-level exercises and demonstrates good strength and mobility with performance.  Pt does not cramping in B LE's when performing and states she is drinking plenty of fluids.  Pt then suggested she could utilize the restroom, and was bale to ambulate independently to the bathroom and back to bed.  Nasal cannula was removed due to length of reach and pt desat to 88% prior to it being placed back on.  Pt would likely benefit from supplemental oxygen.  Current discharge plans to HHPT remain appropriate at this time.  Pt will continue to benefit from skilled therapy in order to address deficits listed below.      Follow Up Recommendations  Home health PT (pt/husband not particularly interested)     Equipment Recommendations  None recommended by PT    Recommendations for Other Services       Precautions / Restrictions      Mobility  Bed Mobility                    Transfers                    Ambulation/Gait                 Stairs             Wheelchair Mobility    Modified Rankin (Stroke Patients Only)       Balance Overall balance assessment: Needs assistance Sitting-balance support: No upper extremity supported;Feet supported Sitting balance-Leahy Scale: Good     Standing balance support: No upper extremity supported Standing balance-Leahy Scale: Good Standing balance comment: Pt with no LOBs or overt unsteadiness but did  have rapid onset anxiety with need to abruptly sit with rather modest effort                            Cognition Arousal/Alertness: Awake/alert Behavior During Therapy: Anxious Overall Cognitive Status: Within Functional Limits for tasks assessed                                        Exercises Total Joint Exercises Ankle Circles/Pumps: AROM;Strengthening;Both;10 reps;Supine Quad Sets: AROM;Strengthening;Both;10 reps;Supine Gluteal Sets: AROM;Strengthening;Both;10 reps;Supine Heel Slides: AROM;Strengthening;Both;10 reps;Supine Hip ABduction/ADduction: AROM;Strengthening;Both;10 reps;Supine Straight Leg Raises: AROM;Strengthening;Both;10 reps;Supine Long Arc Quad: AROM;Strengthening;Both;10 reps;Seated Knee Flexion: AROM;Strengthening;Both;10 reps;Supine Marching in Standing: AROM;Strengthening;Both;10 reps;Standing    General Comments        Pertinent Vitals/Pain Pain Assessment: No/denies pain    Home Living                      Prior Function            PT Goals (current goals can now be found in the care plan section) Acute Rehab PT Goals Patient Stated Goal: to improve breathing PT Goal  Formulation: With patient Time For Goal Achievement: 02/13/21 Potential to Achieve Goals: Good Progress towards PT goals: Progressing toward goals    Frequency    Min 2X/week      PT Plan Current plan remains appropriate    Co-evaluation              AM-PAC PT "6 Clicks" Mobility   Outcome Measure  Help needed turning from your back to your side while in a flat bed without using bedrails?: None Help needed moving from lying on your back to sitting on the side of a flat bed without using bedrails?: None Help needed moving to and from a bed to a chair (including a wheelchair)?: None Help needed standing up from a chair using your arms (e.g., wheelchair or bedside chair)?: None Help needed to walk in hospital room?: None Help  needed climbing 3-5 steps with a railing? : A Little 6 Click Score: 23    End of Session Equipment Utilized During Treatment: Oxygen Activity Tolerance: Patient tolerated treatment well Patient left: with call bell/phone within reach;with family/visitor present;in bed;with bed alarm set Nurse Communication: Mobility status PT Visit Diagnosis: Other abnormalities of gait and mobility (R26.89);Muscle weakness (generalized) (M62.81);Unsteadiness on feet (R26.81)     Time: [id number] PT Time Calculation (min) (ACUTE ONLY): 18 min  Charges:  $Therapeutic Exercise: 8-22 mins                     Gwenlyn Saran, PT, DPT 02/11/21, 5:05 PM    Christie Nottingham 02/11/2021, 5:02 PM

## 2021-02-11 NOTE — Progress Notes (Signed)
Pulmonary Medicine          Date: 02/11/2021,   MRN# [id number] [name] April 02, 1946     AdmissionWeight: 90.7 kg                 CurrentWeight: 97.3 kg   Referring physician: Dr Remi Haggard   CHIEF COMPLAINT:   Respiratory distress with Acute exacerbation of COPD   HISTORY OF PRESENT ILLNESS   75 yo with hx of CABG 2015 , depression,anxiety, hypertension, OSA, COPD, iron deficiency anemia, aortic dilatation, presents emergency department for cc of dyspnea and sob. She is chronically hypoxemic with 3L/min [place] requirement. At bedside, she is no longer tripoding and resting in ED bed and does not appear to be in acute distress. She fully participated with physical exam. Patient presented to the ER with acute onset of worsening dyspnea today with associated chest tightness that is diffuse without radiation and associated nonproductive cough with dyspnea he was recently admitted here and had a negative VQ scan.  She denied any leg pain or worsening edema or recent travels or surgeries.  No melena or bright red bleeding per rectum.  No dysuria, oliguria or hematuria or flank pain.She is able to tell me her full name, DOB, location of hospital, and the current calendar year.She denies recent fever, nausea, vomiting, abdominal pain, dysuria, diarrhea, hematuria, blood in her stool, syncope, lost of consciousness. She reports chest pain every time she coughs. She reports no new cough. She is unsure if she has had weight gain or lost. She denies flu like illness. She denies LE edema. PCCM consult placed for further evaluation and management. Patient has Sjogrens disease her CT chest shows inflammatory changes and pulmonary edema. She is asking to go home but I explained this premature and she agrees to stay.   02/11/21- patient with no overnight events. S/p cardiac cath.    PAST MEDICAL HISTORY   Past Medical History:  Diagnosis Date  . 1st degree AV block   . ACE-inhibitor cough   .  Allergic rhinitis   . Anemia    iron deficiency anemia  . Anxiety   . Aortic ectasia (HCC)    a. CT abd in 12/2016 incidentally noted aortic atherosclerosis and infrarenal abdominal aortic ectasia measuring as large as 2.7 cm with recommendation to repeat US in 2023.  Marland Kitchen Arthritis   . Asthma   . Cataract   . Chronic depression   . Chronic diastolic CHF (congestive heart failure) ([place])   . Chronic headache   . COPD (chronic obstructive pulmonary disease) ([place])   . Coronary artery disease    a. DES to RCA and mid Cx 2009. b. CABG and bioprosthetic AVR May 2015. c. cutting balloon to prox Cx in 05/2016  . Diabetes mellitus    type 2  . Diverticulitis of colon   . Essential hypertension   . GERD (gastroesophageal reflux disease)   . Hearing loss   . History of blood transfusion 2013  . History of prosthetic aortic valve replacement   . HOH (hard of hearing)   . Hypercholesterolemia    intolerance of statins and niaspan  . IDA (iron deficiency anemia) 02/03/2019  . Mobitz type 1 second degree AV block   . OSA (obstructive sleep apnea)    mild, intolerant of cpap  . PAD (peripheral artery disease) ([place])    a. atherosclerosis by CT abd 12/2016 in [place].  Marland Kitchen PONV (postoperative nausea and vomiting)   .  Statin intolerance   . Thyroid disease      SURGICAL HISTORY   Past Surgical History:  Procedure Laterality Date  . ABDOMINAL HYSTERECTOMY    . ABDOMINAL HYSTERECTOMY W/ PARTIAL VAGINACTOMY    . AORTIC VALVE REPLACEMENT N/A 10/12/2013   Procedure: AORTIC VALVE REPLACEMENT (AVR);  Surgeon: [name], MD;  Location: [place];  Service: Open Heart Surgery;  Laterality: N/A;  . [place]  . BARTHOLIN GLAND CYST EXCISION    . BLADDER SUSPENSION    . BREAST BIOPSY Bilateral 09/11/2000   neg  . BREAST BIOPSY Left 07/24/2010   neg  . BREAST CYST EXCISION  1988   bilateral nonmalignant tumors, x3  . CARDIAC CATHETERIZATION    . CARDIAC CATHETERIZATION N/A 05/25/2016   Procedure:  Coronary Balloon Angioplasty;  Surgeon: Leonie Man, MD;  Location: [place] [facility];  Service: Cardiovascular;  Laterality: N/A;  . CARDIAC CATHETERIZATION N/A 05/25/2016   Procedure: Coronary/Graft Angiography;  Surgeon: Leonie Man, MD;  Location: [place] [facility];  Service: Cardiovascular;  Laterality: N/A;  . CATARACT EXTRACTION W/ INTRAOCULAR LENS  IMPLANT, BILATERAL    . CHOLECYSTECTOMY  2001  . COLECTOMY     lap sigmoid  . COLONOSCOPY  2014   polyps found, 2 clamped off.  . CORONARY ANGIOPLASTY  10/29/2007   Prox RCA & Mid Cx.  . CORONARY ARTERY BYPASS GRAFT N/A 10/12/2013   Procedure: CORONARY ARTERY BYPASS GRAFT TIMES TWO;  Surgeon: [name], MD;  Location: [place];  Service: Open Heart Surgery;  Laterality: N/A;  . CORONARY/GRAFT ANGIOGRAPHY N/A 09/20/2017   Procedure: CORONARY/GRAFT ANGIOGRAPHY;  Surgeon: Sherren Mocha, MD;  Location: [place] [facility];  Service: Cardiovascular;  Laterality: N/A;  . LEFT HEART CATHETERIZATION WITH CORONARY ANGIOGRAM N/A 10/09/2013   Procedure: LEFT HEART CATHETERIZATION WITH CORONARY ANGIOGRAM;  Surgeon: [name], MD;  Location: [facility];  Service: Cardiovascular;  Laterality: N/A;  . RIGHT/LEFT HEART CATH AND CORONARY ANGIOGRAPHY N/A 11/03/2020   Procedure: RIGHT/LEFT HEART CATH AND CORONARY ANGIOGRAPHY;  Surgeon: Corey Skains, MD;  Location: [place] [facility];  Service: Cardiovascular;  Laterality: N/A;  . STERNAL WIRES REMOVAL N/A 04/13/2014   Procedure: STERNAL WIRES REMOVAL;  Surgeon: [name], MD;  Location: [place];  Service: Thoracic;  Laterality: N/A;  . TEE WITHOUT CARDIOVERSION N/A 11/03/2020   Procedure: TRANSESOPHAGEAL ECHOCARDIOGRAM (TEE);  Surgeon: Corey Skains, MD;  Location: [facility];  Service: Cardiovascular;  Laterality: N/A;  . THYROIDECTOMY    . TONSILLECTOMY    . TUBAL LIGATION    . VAGINAL DELIVERY     3  . VISCERAL ARTERY INTERVENTION N/A 08/16/2016   Procedure: Visceral  Artery Intervention;  Surgeon: Algernon Huxley, MD;  Location: [place] [facility];  Service: Cardiovascular;  Laterality: N/A;     FAMILY HISTORY   Family History  Problem Relation Age of Onset  . Breast cancer Mother 59  . Hypertension Father   . Mesothelioma Father   . Asthma Father   . Stroke Paternal Grandfather   . Heart disease Other   . Breast cancer Maternal Aunt   . Breast cancer Paternal Aunt      SOCIAL HISTORY   Social History   Tobacco Use  . Smoking status: Former    Packs/day: 0.50    Years: 30.00    Pack years: 15.00    Types: Cigarettes    Quit date: 10/02/2013  Years since quitting: 7.3  . Smokeless tobacco: Never  Vaping Use  . Vaping Use: Never used  Substance Use Topics  . Alcohol use: No  . Drug use: No     MEDICATIONS    Home Medication:    Current Medication:  Current Facility-Administered Medications:  .  0.9 %  sodium chloride infusion, 250 mL, Intravenous, PRN, Andrez Grime, MD .  acetaminophen (TYLENOL) tablet 650 mg, 650 mg, Oral, Q6H PRN, 650 mg at 02/08/21 2123 **OR** acetaminophen (TYLENOL) suppository 650 mg, 650 mg, Rectal, Q6H PRN, Andrez Grime, MD .  ALPRAZolam Duanne Moron) tablet 0.5 mg, 0.5 mg, Oral, TID PRN, Andrez Grime, MD, 0.5 mg at 02/11/21 0909 .  aspirin EC tablet 81 mg, 81 mg, Oral, Daily, Andrez Grime, MD, 81 mg at 02/11/21 [id number] .  clopidogrel (PLAVIX) tablet 75 mg, 75 mg, Oral, Daily, Andrez Grime, MD, 75 mg at 02/11/21 0908 .  DULoxetine (CYMBALTA) DR capsule 20 mg, 20 mg, Oral, q AM, Andrez Grime, MD, 20 mg at 02/11/21 0908 .  fentaNYL (SUBLIMAZE) injection 25 mcg, 25 mcg, Intravenous, Q4H PRN, Andrez Grime, MD .  FLUoxetine (PROZAC) capsule 40 mg, 40 mg, Oral, BID, Andrez Grime, MD, 40 mg at 02/11/21 0907 .  guaiFENesin (MUCINEX) 12 hr tablet 600 mg, 600 mg, Oral, BID, Andrez Grime, MD, 600 mg at 02/11/21 0908 .  insulin aspart (novoLOG) injection 0-20  Units, 0-20 Units, Subcutaneous, TID WC, Andrez Grime, MD, 20 Units at 02/11/21 1211 .  insulin aspart (novoLOG) injection 0-5 Units, 0-5 Units, Subcutaneous, QHS, Orgel, Kathrin Ruddy, MD .  insulin aspart protamine- aspart (NOVOLOG MIX 70/30) injection 70 Units, 70 Units, Subcutaneous, BID WC, Andrez Grime, MD, 70 Units at 02/11/21 0910 .  ipratropium-albuterol (DUONEB) 0.5-2.5 (3) MG/3ML nebulizer solution 3 mL, 3 mL, Nebulization, Q4H PRN, Andrez Grime, MD .  levothyroxine (SYNTHROID) tablet 125 mcg, 125 mcg, Oral, Q0600, Andrez Grime, MD, 125 mcg at 02/11/21 [phone] .  magnesium hydroxide (MILK OF MAGNESIA) suspension 30 mL, 30 mL, Oral, Daily PRN, Andrez Grime, MD .  methylPREDNISolone (MEDROL DOSEPAK) tablet 4 mg, 4 mg, Oral, 4X daily taper, Andrez Grime, MD, 4 mg at 02/11/21 1210 .  [DISCONTINUED] ondansetron (ZOFRAN) tablet 4 mg, 4 mg, Oral, Q6H PRN **OR** ondansetron (ZOFRAN) injection 4 mg, 4 mg, Intravenous, Q6H PRN, Andrez Grime, MD, 4 mg at 02/10/21 1753 .  ondansetron (ZOFRAN) tablet 4 mg, 4 mg, Oral, Q8H PRN, Andrez Grime, MD .  pantoprazole (PROTONIX) EC tablet 40 mg, 40 mg, Oral, Daily, Andrez Grime, MD, 40 mg at 02/11/21 0909 .  sodium chloride flush (NS) 0.9 % injection 3 mL, 3 mL, Intravenous, Q12H, Andrez Grime, MD, 3 mL at 02/11/21 0914 .  sodium chloride flush (NS) 0.9 % injection 3 mL, 3 mL, Intravenous, Q12H, Andrez Grime, MD, 3 mL at 02/11/21 0915 .  sodium chloride flush (NS) 0.9 % injection 3 mL, 3 mL, Intravenous, PRN, Andrez Grime, MD .  torsemide [facility]) tablet 20 mg, 20 mg, Oral, Daily, Andrez Grime, MD, 20 mg at 02/11/21 0906 .  traZODone (DESYREL) tablet 200 mg, 200 mg, Oral, QHS, Andrez Grime, MD, 200 mg at 02/10/21 2258    ALLERGIES   Amitriptyline, Benadryl [diphenhydramine], Demerol [meperidine], Gabapentin, Meperidine hcl, Mirtazapine, Olanzapine, Voltaren  [diclofenac sodium], Zetia [ezetimibe], Ativan [lorazepam], Atorvastatin, Budesonide-formoterol fumarate, Bupropion hcl, Caffeine, Codeine sulfate, Lisinopril, Metformin, Mometasone  furoate, Morphine sulfate, Other, Oxycodone-acetaminophen, Pioglitazone, Propoxyphene n-acetaminophen, Rosuvastatin, Shellfish allergy, Suvorexant, Ticagrelor, Tramadol, Trazodone and nefazodone, Venlafaxine, Zolpidem tartrate, and Latex     REVIEW OF SYSTEMS    Review of Systems:  Gen:  Denies  fever, sweats, chills weigh loss  HEENT: Denies blurred vision, double vision, ear pain, eye pain, hearing loss, nose bleeds, sore throat Cardiac:  No dizziness, chest pain or heaviness, chest tightness,edema Resp:   Denies cough or sputum porduction, shortness of breath,wheezing, hemoptysis,  Gi: Denies swallowing difficulty, stomach pain, nausea or vomiting, diarrhea, constipation, bowel incontinence Gu:  Denies bladder incontinence, burning urine Ext:   Denies Joint pain, stiffness or swelling Skin: Denies  skin rash, easy bruising or bleeding or hives Endoc:  Denies polyuria, polydipsia , polyphagia or weight change Psych:   Denies depression, insomnia or hallucinations   Other:  All other systems negative   VS: BP 118/66 (BP Location: Left Arm)   Pulse 78   Temp 98.2 F (36.8 C) (Oral)   Resp 19   Ht '5\' 6"'$  (1.676 m)   Wt 97.3 kg   SpO2 96%   BMI 34.62 kg/m      PHYSICAL EXAM    GENERAL:NAD, no fevers, chills, no weakness no fatigue HEAD: Normocephalic, atraumatic.  EYES: Pupils equal, round, reactive to light. Extraocular muscles intact. No scleral icterus.  MOUTH: Moist mucosal membrane. Dentition intact. No abscess noted.  EAR, NOSE, THROAT: Clear without exudates. No external lesions.  NECK: Supple. No thyromegaly. No nodules. No JVD.  PULMONARY: Mild crackles bilaterally worse at bases posteriorly CARDIOVASCULAR: S1 and S2. Regular rate and rhythm. No murmurs, rubs, or gallops. No edema.  Pedal pulses 2+ bilaterally.  GASTROINTESTINAL: Soft, nontender, nondistended. No masses. Positive bowel sounds. No hepatosplenomegaly.  MUSCULOSKELETAL: No swelling, clubbing, or edema. Range of motion full in all extremities.  NEUROLOGIC: Cranial nerves II through XII are intact. No gross focal neurological deficits. Sensation intact. Reflexes intact.  SKIN: No ulceration, lesions, rashes, or cyanosis. Skin warm and dry. Turgor intact.  PSYCHIATRIC: Mood, affect within normal limits. The patient is awake, alert and oriented x 3. Insight, judgment intact.       IMAGING    DG Chest 2 View  Result Date: 02/07/2021 CLINICAL DATA:  Shortness of breath.  Chest pain. EXAM: CHEST - 2 VIEW COMPARISON:  Most recent radiograph and CT 02/01/2021. FINDINGS: Stable prominent cardiac contours post aortic valve replacement and median sternotomy. Unchanged mediastinal contours. Chronic coarse lung markings. No confluent airspace disease, pleural effusion, or pneumothorax. Ground-glass airspace disease on prior CT are not well seen by radiograph. IMPRESSION: No acute chest finding.  Chronic coarse lung markings. Electronically Signed   By: Keith Rake M.D.   On: 02/07/2021 22:47   CT CHEST WO CONTRAST  Result Date: 02/01/2021 CLINICAL DATA:  COPD exacerbation shob and chest pain that started this arm. EXAM: CT CHEST WITHOUT CONTRAST TECHNIQUE: Multidetector CT imaging of the chest was performed following the standard protocol without IV contrast. COMPARISON:  CT angio chest 08/08/2020, CT angiography chest 06/15/2020 FINDINGS: Cardiovascular: Normal heart size. Status post aortic valve replacement. No significant pericardial effusion. The thoracic aorta is normal in caliber. At least moderate atherosclerotic plaque of the thoracic aorta. Four-vessel coronary artery calcifications status post coronary artery stents. The main pulmonary artery is enlarged in caliber measuring up to 3.5 cm. Mediastinum/Nodes: No  gross hilar adenopathy, noting limited sensitivity for the detection of hilar adenopathy on this noncontrast study. No enlarged mediastinal or axillary  lymph nodes. Nonspecific debris noted within the esophagus. The thyroid and trachea demonstrate no significant findings. Lungs/Pleura: No focal consolidation. Interval decrease in almost resolved patchy ground-glass airspace opacities with a persistent peribronchovascular nodular-like 1 cm right upper lobe lesion (3:54, 5:78). Interval development of a 1.3 cm ground-glass airspace opacity at the right apex (3:17, 5:104). Trace persistent interlobular septal wall thickening. No pulmonary mass. No pleural effusion. No pneumothorax. Upper Abdomen: Status post cholecystectomy.  No acute abnormality. Musculoskeletal: No chest wall abnormality. No suspicious lytic or blastic osseous lesions. No acute displaced fracture. Sternotomy wires are noted. IMPRESSION: 1. Interval development of a 1.3 cm ground-glass airspace opacity at the right apex. Persistent peribronchovascular nodular like 1 cm right upper lobe ground-glass airspace opacity. Adenocarcinoma cannot be excluded. Follow up by CT is recommended in 12 months, with continued annual surveillance for a minimum of 3 years. These recommendations are taken from: Recommendations for the Management of Subsolid Pulmonary Nodules Detected at CT: A Statement from the [place] [facility] 2013; 266:1, 304-317. 2. Enlarged main pulmonary artery suggestive of hypertension. 3. Debris noted within the esophagus. 4. Aortic Atherosclerosis (ICD10-I70.0) including four-vessel coronary artery calcifications status post stent. 5. Status aortic valve replacement. Electronically Signed   By: Iven Finn M.D.   On: 02/01/2021 17:29   CT Angio Chest PE W and/or Wo Contrast  Result Date: 02/08/2021 CLINICAL DATA:  PE suspected, high prob. Dyspnea. Nonproductive cough. EXAM: CT ANGIOGRAPHY CHEST WITH CONTRAST TECHNIQUE:  Multidetector CT imaging of the chest was performed using the standard protocol during bolus administration of intravenous contrast. Multiplanar CT image reconstructions and MIPs were obtained to evaluate the vascular anatomy. CONTRAST:  62m OMNIPAQUE IOHEXOL 350 MG/ML SOLN COMPARISON:  02/01/2021 FINDINGS: Cardiovascular: There is adequate opacification of the pulmonary arterial tree. No intraluminal filling defect through the segmental level to suggest acute pulmonary embolism. The central pulmonary arteries are enlarged in keeping with changes of pulmonary arterial hypertension. Extensive multi-vessel coronary artery calcification is noted. Probable stenting of the proximal right and left circumflex coronary arteries. Coronary artery bypass grafting and aortic valve replacement has been performed. Mild global cardiomegaly is present. Moderate largely noncalcified atheromatous plaque seen throughout the aorta. No aortic aneurysm. Mediastinum/Nodes: No pathologic thoracic adenopathy. The esophagus is unremarkable. Lungs/Pleura: Imaging is slightly limited by motion artifact. There is bilateral asymmetric patchy ground-glass pulmonary infiltrate more focal within the mid lung zones bilaterally possibly infectious or inflammatory in nature as can be seen with COVID-19 pneumonia. This has progressed since prior examination. No pneumothorax or pleural effusion. Central airways are widely patent. Upper Abdomen: Cholecystectomy has been performed. No acute abnormality. Musculoskeletal: No acute bone abnormality. No lytic or blastic bone lesion. Review of the MIP images confirms the above findings. IMPRESSION: No pulmonary embolism. Mild cardiomegaly. Morphologic changes in keeping with pulmonary arterial hypertension. Progressive patchy bilateral mid lung zone ground-glass pulmonary infiltrate, possibly infectious or inflammatory in nature. Serologic correlation for COVID-19 pneumonia may be helpful. Aortic  Atherosclerosis (ICD10-I70.0). Electronically Signed   By: AFidela SalisburyM.D.   On: 02/08/2021 02:55   NM Pulmonary Perfusion  Result Date: 01/30/2021 CLINICAL DATA:  Shortness of breath EXAM: NUCLEAR MEDICINE PERFUSION LUNG SCAN TECHNIQUE: Perfusion images were obtained in multiple projections after intravenous injection of radiopharmaceutical. Ventilation scans intentionally deferred if perfusion scan and chest x-ray adequate for interpretation during COVID 19 epidemic. RADIOPHARMACEUTICALS:  4 mCi Tc-940mAA IV COMPARISON:  01/28/2021 FINDINGS: There is adequate uptake of radioactive tracer throughout both lungs. No focal defect  to suggest pulmonary embolism is noted. Cardiac shadow is enlarged. IMPRESSION: No focal defect to suggest pulmonary embolism. Electronically Signed   By: Inez Catalina M.D.   On: 01/30/2021 16:07   CARDIAC CATHETERIZATION  Result Date: 02/10/2021 .  Mid LAD lesion is 100% stenosed. Colon Flattery RCA lesion is 99% stenosed. Colon Flattery RCA to Prox RCA lesion is 20% stenosed. .  Prox RCA lesion is 80% stenosed. .  Mid RCA lesion is 100% stenosed. .  Prox Cx to Mid Cx lesion is 50% stenosed. .  Previously placed Origin to Prox Graft stent (unknown type) is  widely patent. .  Previously placed Dist Graft to Insertion stent (unknown type) is  widely patent. .  Previously placed Prox Cx stent (unknown type) is  widely patent. .  SVG and is large. Marland Kitchen  LIMA and is normal in caliber and large. Patient agitation and frequent movement made the procedure technically difficult. Conclusion: Severe native three-vessel coronary artery disease including CTO of the mid LAD, CTO of the mid RCA, and patent stent in the mid left circumflex. Patent SVG to RCA with 2 patent stents in the proximal and distal segments. Patent LIMA to LAD RA 6, RV 38/5, PA 37/16 (25), PCWP 15 Fick CO equals 3.91, Fick CI 1.93 Recommendation Unable to cross the aortic valve during this procedure.  She needs additional evaluation to  determine the severity of her aortic valve stenosis.  Likely we will perform a TEE next week. She has been appropriately diuresed with a RA pressure of 6 and pulmonary capillary wedge pressure 15.  I have ordered her home torsemide of 20 mg daily to be started tomorrow. Continue aspirin and Plavix as previously prescribed for stent placed in June 2022   DG Chest Port 1 View  Result Date: 02/09/2021 CLINICAL DATA:  Bilateral pulmonary infiltrates on CXR EXAM: PORTABLE CHEST - 1 VIEW COMPARISON:  CT from previous day FINDINGS: Coarse nodular airspace opacities throughout both lung fields as before. No pleural effusion. No pneumothorax. Heart size upper limits normal.  Right hilar fullness. Sternotomy wires. Visualized bones unremarkable. IMPRESSION: 1. Persistent coarse nodular bilateral airspace opacities. 2. Right hilar fullness presumably related to regional airspace disease. Electronically Signed   By: Lucrezia Europe M.D.   On: 02/09/2021 08:00   DG Chest Port 1 View  Result Date: 02/01/2021 CLINICAL DATA:  75 year old female with history of shortness of breath. COPD. EXAM: PORTABLE CHEST 1 VIEW COMPARISON:  Chest x-ray 01/30/2021. FINDINGS: Lung volumes are normal. No consolidative airspace disease. No pleural effusions. Diffuse interstitial prominence and widespread peribronchial cuffing. No pneumothorax. No definite suspicious appearing pulmonary nodules or masses are noted. No evidence of pulmonary edema. Mild cardiomegaly. Upper mediastinal contours are within normal limits. Status post hemi median sternotomy for aortic valve replacement with what appears to be a stented bioprosthesis. IMPRESSION: 1. Diffuse interstitial prominence and peribronchial cuffing similar to prior studies suggestive of probable chronic bronchitis. 2. Cardiomegaly. Electronically Signed   By: Vinnie Langton M.D.   On: 02/01/2021 10:49   DG Chest Port 1 View  Result Date: 01/30/2021 CLINICAL DATA:  Post VQ scan EXAM: PORTABLE  CHEST 1 VIEW COMPARISON:  01/28/2021, 11/24/2020, 03/04/2018 FINDINGS: Post sternotomy changes with valve replacement. Cardiomegaly with mild vascular congestion. Diffuse interstitial opacities some of which is felt due to chronic disease. No pleural effusion or pneumothorax. IMPRESSION: 1. Cardiomegaly with slight central congestion and chronic interstitial opacity. Electronically Signed   By: Madie Reno.D.  On: 01/30/2021 16:34   DG Chest Port 1 View  Result Date: 01/28/2021 CLINICAL DATA:  Difficulty breathing.  Shortness of breath. EXAM: PORTABLE CHEST 1 VIEW COMPARISON:  November 24, 2020 FINDINGS: Stable cardiomegaly. The hila and mediastinum are normal. No pneumothorax. No nodules or masses. No focal infiltrates. Increased interstitial opacities in the lungs suggesting mild edema. IMPRESSION: Cardiomegaly and mild pulmonary edema. No other acute abnormalities. Electronically Signed   By: Dorise Bullion III M.D.   On: 01/28/2021 19:17   ECHOCARDIOGRAM COMPLETE  Result Date: 02/02/2021    ECHOCARDIOGRAM REPORT   Patient Name:   [name] Date of Exam: 02/02/2021 Medical Rec #:  [id number]     Height:       66.0 in Accession #:    [id number]    Weight:       203.0 lb Date of Birth:  May 30, 1946      BSA:          2.012 m Patient Age:    72 years      BP:           144/61 mmHg Patient Gender: F             HR:           74 bpm. Exam Location:  [facility] Procedure: 2D Echo, Color Doppler and Cardiac Doppler Indications:     Chest pain R07.9  History:         Patient has prior history of Echocardiogram examinations, most                  recent 11/25/2020. Prior CABG, COPD; Risk Factors:Hypertension.  Sonographer:     Sherrie Sport Referring Phys:  [id number] SARA-MAIZ A THOMAS Diagnosing Phys: Serafina Royals MD  Sonographer Comments: Technically challenging study due to limited acoustic windows, suboptimal parasternal window and no apical window. Image acquisition challenging due to COPD. IMPRESSIONS  1. Left  ventricular ejection fraction, by estimation, is 55 to 60%. The left ventricle has normal function. The left ventricle has no regional wall motion abnormalities. Left ventricular diastolic parameters were normal.  2. Right ventricular systolic function is normal. The right ventricular size is normal.  3. The mitral valve is normal in structure. Mild mitral valve regurgitation. Moderate mitral annular calcification.  4. The aortic valve is normal in structure. Aortic valve regurgitation is not visualized. FINDINGS  Left Ventricle: Left ventricular ejection fraction, by estimation, is 55 to 60%. The left ventricle has normal function. The left ventricle has no regional wall motion abnormalities. The left ventricular internal cavity size was normal in size. There is  no left ventricular hypertrophy. Left ventricular diastolic parameters were normal. Right Ventricle: The right ventricular size is normal. No increase in right ventricular wall thickness. Right ventricular systolic function is normal. Left Atrium: Left atrial size was normal in size. Right Atrium: Right atrial size was normal in size. Pericardium: There is no evidence of pericardial effusion. Mitral Valve: The mitral valve is normal in structure. Moderate mitral annular calcification. Mild mitral valve regurgitation. Tricuspid Valve: The tricuspid valve is normal in structure. Tricuspid valve regurgitation is mild. Aortic Valve: The aortic valve is normal in structure. Aortic valve regurgitation is not visualized. Pulmonic Valve: The pulmonic valve was normal in structure. Pulmonic valve regurgitation is not visualized. Aorta: The aortic root and ascending aorta are structurally normal, with no evidence of dilitation. IAS/Shunts: No atrial level shunt detected by color flow Doppler.  LEFT VENTRICLE PLAX 2D LVIDd:  4.40 cm LVIDs:         3.10 cm LV PW:         1.50 cm LV IVS:        1.80 cm LVOT diam:     2.10 cm LVOT Area:     3.46 cm  LEFT ATRIUM          Index LA diam:    4.70 cm 2.34 cm/m                        PULMONIC VALVE AORTA                 PV Vmax:        0.84 m/s Ao Root diam: 3.10 cm PV Peak grad:   2.8 mmHg                       RVOT Peak grad: 5 mmHg   SHUNTS Systemic Diam: 2.10 cm Serafina Royals MD Electronically signed by Serafina Royals MD Signature Date/Time: 02/02/2021/4:41:25 PM    Final            ASSESSMENT/PLAN   Acute on chronic hypoxemic respiratory faiulre  -patient has severe multi vessel CAD based on cardiac cath few months ago  - acutely there is evidence of pulmonary edema.  -she had episode of respiratory distress with recurrent panic attack induced hyperventilation -patient does have moderate pulmonary hypertension on RHC -ddimer was elevated previously  -will consider sildenafil TID for pulmonary hypertension once more diruresed -vital were rechecked during my examination and were stable  Moderate pulmonary hypertension   - sildenafil 20 TID post diuresis  Acute pulmonary edema   -recheck TTE   Acute exacerbation of COPD with chronic hypoxemia   -  soluemdrol changed to tapering dose pack.  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  Will dc busbar as patient still with sgnificant anxiety, starting xanax low dose tid to help temporarily with anxiety while hospitalized.  Due to recurrent panic attacks she may benefit from proper psychiatric evaluation and medication optimization .  -s/p psychiatric evaluation , everything appeared good no need for any new therapy so this points away from panic attack induced dyspnea   Thank you for allowing me to participate in the care of this patient.   Patient/Family are satisfied with care plan and all questions have been answered.  This document was prepared using Dragon voice recognition software and may include  unintentional dictation errors.     Ottie Glazier, M.D.  Division of [facility]

## 2021-02-11 NOTE — Progress Notes (Signed)
[facility] Cardiology    SUBJECTIVE: Status post cardiac cath without difficulty resting comfortably in the room no chest pain no shortness of breath no palpitation tachycardia prepared for TEE on Monday   Vitals:   02/11/21 0023 02/11/21 0309 02/11/21 0410 02/11/21 0744  BP: (!) 106/53  (!) 141/56 122/71  Pulse: 91  72 70  Resp: (!) '21  18 19  '$ Temp: 97.6 F (36.4 C)  98 F (36.7 C) 98.1 F (36.7 C)  TempSrc: Oral   Oral  SpO2: 97%  98% 98%  Weight:  97.3 kg    Height:         Intake/Output Summary (Last 24 hours) at 02/11/2021 [id number] Last data filed at 02/11/2021 0745 Gross per 24 hour  Intake 960 ml  Output 4500 ml  Net -3540 ml      PHYSICAL EXAM  General: Well developed, well nourished, in no acute distress HEENT:  Normocephalic and atramatic Neck:  No JVD.  Lungs: Clear bilaterally to auscultation and percussion. Heart: HRRR . Normal S1 and S2 without gallops or murmurs.  Abdomen: Bowel sounds are positive, abdomen soft and non-tender  Msk:  Back normal, normal gait. Normal strength and tone for age. Extremities: No clubbing, cyanosis or edema.   Neuro: Alert and oriented X 3. Psych:  Good affect, responds appropriately   LABS: Basic Metabolic Panel: Recent Labs    02/10/21 0731 02/11/21 0553  NA 127* 133*  K 5.0 5.5*  CL 88* 93*  CO2 31 34*  GLUCOSE 396* 257*  BUN 34* 35*  CREATININE 1.14* 1.28*  CALCIUM 8.4* 8.2*  MG 2.1 2.2   Liver Function Tests: No results for input(s): AST, ALT, ALKPHOS, BILITOT, PROT, ALBUMIN in the last 72 hours. No results for input(s): LIPASE, AMYLASE in the last 72 hours. CBC: Recent Labs    02/10/21 0731 02/11/21 0553  WBC 16.3* 14.3*  NEUTROABS 13.7* 12.0*  HGB 9.8* 11.2*  HCT 29.0* 32.9*  MCV 84.5 86.8  PLT 211 241   Cardiac Enzymes: No results for input(s): CKTOTAL, CKMB, CKMBINDEX, TROPONINI in the last 72 hours. BNP: Invalid input(s): POCBNP D-Dimer: No results for input(s): DDIMER in the last 72  hours. Hemoglobin A1C: No results for input(s): HGBA1C in the last 72 hours. Fasting Lipid Panel: No results for input(s): CHOL, HDL, LDLCALC, TRIG, CHOLHDL, LDLDIRECT in the last 72 hours. Thyroid Function Tests: No results for input(s): TSH, T4TOTAL, T3FREE, THYROIDAB in the last 72 hours.  Invalid input(s): FREET3 Anemia Panel: No results for input(s): VITAMINB12, FOLATE, FERRITIN, TIBC, IRON, RETICCTPCT in the last 72 hours.  CARDIAC CATHETERIZATION  Result Date: 02/10/2021   Mid LAD lesion is 100% stenosed.   Ost RCA lesion is 99% stenosed.   Ost RCA to Prox RCA lesion is 20% stenosed.   Prox RCA lesion is 80% stenosed.   Mid RCA lesion is 100% stenosed.   Prox Cx to Mid Cx lesion is 50% stenosed.   Previously placed Origin to Prox Graft stent (unknown type) is  widely patent.   Previously placed Dist Graft to Insertion stent (unknown type) is  widely patent.   Previously placed Prox Cx stent (unknown type) is  widely patent.   SVG and is large.   LIMA and is normal in caliber and large. Patient agitation and frequent movement made the procedure technically difficult. Conclusion: Severe native three-vessel coronary artery disease including CTO of the mid LAD, CTO of the mid RCA, and patent stent in the mid left circumflex.  Patent SVG to RCA with 2 patent stents in the proximal and distal segments. Patent LIMA to LAD RA 6, RV 38/5, PA 37/16 (25), PCWP 15 Fick CO equals 3.91, Fick CI 1.93 Recommendation Unable to cross the aortic valve during this procedure.  She needs additional evaluation to determine the severity of her aortic valve stenosis.  Likely we will perform a TEE next week. She has been appropriately diuresed with a RA pressure of 6 and pulmonary capillary wedge pressure 15.  I have ordered her home torsemide of 20 mg daily to be started tomorrow. Continue aspirin and Plavix as previously prescribed for stent placed in June 2022     Echo preserved left ventricular function ejection  fraction of 55%  TELEMETRY: Normal sinus rhythm nonspecific ST-T changes with LVH:  ASSESSMENT AND PLAN:  Principal Problem:   GAD (generalized anxiety disorder) Active Problems:   COPD exacerbation (HCC) Aortic disease Aortic stenosis Status post cardiac cath History of non-STEMI GI bleed on blood thinner History of CABG . Plan Continue medical therapy Recommend TEE Monday for evaluation of aortic valve Continue medical therapy Continue diabetes management and control Maintain hypertension management Sleep study CPAP for possible obstructive sleep apnea Inhalers as necessary for COPD Conservative medical management for now        Yolonda Kida, MD 02/11/2021 9:09 AM
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
## 2021-02-12 DIAGNOSIS — J9611 Chronic respiratory failure with hypoxia: Secondary | ICD-10-CM | POA: Diagnosis not present

## 2021-02-12 DIAGNOSIS — J441 Chronic obstructive pulmonary disease with (acute) exacerbation: Secondary | ICD-10-CM | POA: Diagnosis not present

## 2021-02-12 DIAGNOSIS — R079 Chest pain, unspecified: Secondary | ICD-10-CM | POA: Diagnosis not present

## 2021-02-12 LAB — CBC WITH DIFFERENTIAL/PLATELET
Abs Immature Granulocytes: 0.17 10*3/uL — ABNORMAL HIGH (ref 0.00–0.07)
Basophils Absolute: 0.1 10*3/uL (ref 0.0–0.1)
Basophils Relative: 0 %
Eosinophils Absolute: 0.2 10*3/uL (ref 0.0–0.5)
Eosinophils Relative: 2 %
HCT: 31.1 % — ABNORMAL LOW (ref 36.0–46.0)
Hemoglobin: 10.3 g/dL — ABNORMAL LOW (ref 12.0–15.0)
Immature Granulocytes: 1 %
Lymphocytes Relative: 11 %
Lymphs Abs: 1.8 10*3/uL (ref 0.7–4.0)
MCH: 28.7 pg (ref 26.0–34.0)
MCHC: 33.1 g/dL (ref 30.0–36.0)
MCV: 86.6 fL (ref 80.0–100.0)
Monocytes Absolute: 0.9 10*3/uL (ref 0.1–1.0)
Monocytes Relative: 5 %
Neutro Abs: 12.5 10*3/uL — ABNORMAL HIGH (ref 1.7–7.7)
Neutrophils Relative %: 81 %
Platelets: 222 10*3/uL (ref 150–400)
RBC: 3.59 MIL/uL — ABNORMAL LOW (ref 3.87–5.11)
RDW: 13.5 % (ref 11.5–15.5)
WBC: 15.6 10*3/uL — ABNORMAL HIGH (ref 4.0–10.5)
nRBC: 0 % (ref 0.0–0.2)

## 2021-02-12 LAB — BASIC METABOLIC PANEL
Anion gap: 9 (ref 5–15)
BUN: 34 mg/dL — ABNORMAL HIGH (ref 8–23)
CO2: 32 mmol/L (ref 22–32)
Calcium: 7.8 mg/dL — ABNORMAL LOW (ref 8.9–10.3)
Chloride: 93 mmol/L — ABNORMAL LOW (ref 98–111)
Creatinine, Ser: 1.24 mg/dL — ABNORMAL HIGH (ref 0.44–1.00)
GFR, Estimated: 45 mL/min — ABNORMAL LOW (ref >60)
Glucose, Bld: 294 mg/dL — ABNORMAL HIGH (ref 70–99)
Potassium: 4.9 mmol/L (ref 3.5–5.1)
Sodium: 134 mmol/L — ABNORMAL LOW (ref 135–145)

## 2021-02-12 LAB — GLUCOSE, CAPILLARY
Glucose-Capillary: 271 mg/dL — ABNORMAL HIGH (ref 70–99)
Glucose-Capillary: 380 mg/dL — ABNORMAL HIGH (ref 70–99)
Glucose-Capillary: 474 mg/dL — ABNORMAL HIGH (ref 70–99)
Glucose-Capillary: 352 mg/dL — ABNORMAL HIGH (ref 70–99)

## 2021-02-12 LAB — MAGNESIUM: Magnesium: 2.1 mg/dL (ref 1.7–2.4)

## 2021-02-12 MED ORDER — INSULIN ASPART 100 UNIT/ML IJ SOLN
25.0000 [IU] | Freq: Once | INTRAMUSCULAR | Status: AC
  Administered 2021-02-12: 25 [IU] via SUBCUTANEOUS
  Filled 2021-02-12: qty 1

## 2021-02-12 MED ORDER — INSULIN ASPART PROT & ASPART (70-30 MIX) 100 UNIT/ML ~~LOC~~ SUSP
75.0000 [IU] | Freq: Two times a day (BID) | SUBCUTANEOUS | Status: DC
  Administered 2021-02-12 – 2021-02-13 (×2): 75 [IU] via SUBCUTANEOUS
  Filled 2021-02-12: qty 10

## 2021-02-12 NOTE — Progress Notes (Signed)
Pulmonary Medicine          Date: 02/12/2021,   MRN# [id number] [name] 03/02/1946     AdmissionWeight: 90.7 kg                 CurrentWeight: 90.7 kg   Referring physician: Dr Remi Haggard   CHIEF COMPLAINT:   Respiratory distress with Acute exacerbation of COPD   HISTORY OF PRESENT ILLNESS   75 yo with hx of CABG 2015 , depression,anxiety, hypertension, OSA, COPD, iron deficiency anemia, aortic dilatation, presents emergency department for cc of dyspnea and sob. She is chronically hypoxemic with 3L/min [place] requirement. At bedside, she is no longer tripoding and resting in ED bed and does not appear to be in acute distress. She fully participated with physical exam. Patient presented to the ER with acute onset of worsening dyspnea today with associated chest tightness that is diffuse without radiation and associated nonproductive cough with dyspnea he was recently admitted here and had a negative VQ scan.  She denied any leg pain or worsening edema or recent travels or surgeries.  No melena or bright red bleeding per rectum.  No dysuria, oliguria or hematuria or flank pain.She is able to tell me her full name, DOB, location of hospital, and the current calendar year.She denies recent fever, nausea, vomiting, abdominal pain, dysuria, diarrhea, hematuria, blood in her stool, syncope, lost of consciousness. She reports chest pain every time she coughs. She reports no new cough. She is unsure if she has had weight gain or lost. She denies flu like illness. She denies LE edema. PCCM consult placed for further evaluation and management. Patient has Sjogrens disease her CT chest shows inflammatory changes and pulmonary edema. She is asking to go home but I explained this premature and she agrees to stay.   02/11/21- patient with no overnight events. S/p cardiac cath.   02/11/21- met with husband [name] at bedside , discussed hospital course.  We discussed pulmonary status , seems more  stable and at this point cardiac issues are priority and patient explains they have reviewed plan with cardiology team for possible surgical intervention on aortic valve in [place].  PAST MEDICAL HISTORY   Past Medical History:  Diagnosis Date  . 1st degree AV block   . ACE-inhibitor cough   . Allergic rhinitis   . Anemia    iron deficiency anemia  . Anxiety   . Aortic ectasia (HCC)    a. CT abd in 12/2016 incidentally noted aortic atherosclerosis and infrarenal abdominal aortic ectasia measuring as large as 2.7 cm with recommendation to repeat US in 2023.  Marland Kitchen Arthritis   . Asthma   . Cataract   . Chronic depression   . Chronic diastolic CHF (congestive heart failure) ([place])   . Chronic headache   . COPD (chronic obstructive pulmonary disease) ([place])   . Coronary artery disease    a. DES to RCA and mid Cx 2009. b. CABG and bioprosthetic AVR May 2015. c. cutting balloon to prox Cx in 05/2016  . Diabetes mellitus    type 2  . Diverticulitis of colon   . Essential hypertension   . GERD (gastroesophageal reflux disease)   . Hearing loss   . History of blood transfusion 2013  . History of prosthetic aortic valve replacement   . HOH (hard of hearing)   . Hypercholesterolemia    intolerance of statins and niaspan  . IDA (iron deficiency anemia) 02/03/2019  . Mobitz type  1 second degree AV block   . OSA (obstructive sleep apnea)    mild, intolerant of cpap  . PAD (peripheral artery disease) ([place])    a. atherosclerosis by CT abd 12/2016 in [place].  Marland Kitchen PONV (postoperative nausea and vomiting)   . Statin intolerance   . Thyroid disease      SURGICAL HISTORY   Past Surgical History:  Procedure Laterality Date  . ABDOMINAL HYSTERECTOMY    . ABDOMINAL HYSTERECTOMY W/ PARTIAL VAGINACTOMY    . AORTIC VALVE REPLACEMENT N/A 10/12/2013   Procedure: AORTIC VALVE REPLACEMENT (AVR);  Surgeon: [name], MD;  Location: [place];  Service: Open Heart Surgery;  Laterality: N/A;  . [place]   . BARTHOLIN GLAND CYST EXCISION    . BLADDER SUSPENSION    . BREAST BIOPSY Bilateral 09/11/2000   neg  . BREAST BIOPSY Left 07/24/2010   neg  . BREAST CYST EXCISION  1988   bilateral nonmalignant tumors, x3  . CARDIAC CATHETERIZATION    . CARDIAC CATHETERIZATION N/A 05/25/2016   Procedure: Coronary Balloon Angioplasty;  Surgeon: Leonie Man, MD;  Location: [place] [facility];  Service: Cardiovascular;  Laterality: N/A;  . CARDIAC CATHETERIZATION N/A 05/25/2016   Procedure: Coronary/Graft Angiography;  Surgeon: Leonie Man, MD;  Location: [place] [facility];  Service: Cardiovascular;  Laterality: N/A;  . CATARACT EXTRACTION W/ INTRAOCULAR LENS  IMPLANT, BILATERAL    . CHOLECYSTECTOMY  2001  . COLECTOMY     lap sigmoid  . COLONOSCOPY  2014   polyps found, 2 clamped off.  . CORONARY ANGIOPLASTY  10/29/2007   Prox RCA & Mid Cx.  . CORONARY ARTERY BYPASS GRAFT N/A 10/12/2013   Procedure: CORONARY ARTERY BYPASS GRAFT TIMES TWO;  Surgeon: [name], MD;  Location: [place];  Service: Open Heart Surgery;  Laterality: N/A;  . CORONARY/GRAFT ANGIOGRAPHY N/A 09/20/2017   Procedure: CORONARY/GRAFT ANGIOGRAPHY;  Surgeon: Sherren Mocha, MD;  Location: [place] [facility];  Service: Cardiovascular;  Laterality: N/A;  . LEFT HEART CATHETERIZATION WITH CORONARY ANGIOGRAM N/A 10/09/2013   Procedure: LEFT HEART CATHETERIZATION WITH CORONARY ANGIOGRAM;  Surgeon: [name], MD;  Location: [facility] C[facility];  Service: Cardiovascular;  Laterality: N/A;  . RIGHT/LEFT HEART CATH AND CORONARY ANGIOGRAPHY N/A 11/03/2020   Procedure: RIGHT/LEFT HEART CATH AND CORONARY ANGIOGRAPHY;  Surgeon: Corey Skains, MD;  Location: [place] [facility];  Service: Cardiovascular;  Laterality: N/A;  . STERNAL WIRES REMOVAL N/A 04/13/2014   Procedure: STERNAL WIRES REMOVAL;  Surgeon: [name], MD;  Location: [place];  Service: Thoracic;  Laterality: N/A;  . TEE WITHOUT CARDIOVERSION N/A 11/03/2020   Procedure:  TRANSESOPHAGEAL ECHOCARDIOGRAM (TEE);  Surgeon: Corey Skains, MD;  Location: [facility];  Service: Cardiovascular;  Laterality: N/A;  . THYROIDECTOMY    . TONSILLECTOMY    . TUBAL LIGATION    . VAGINAL DELIVERY     3  . VISCERAL ARTERY INTERVENTION N/A 08/16/2016   Procedure: Visceral Artery Intervention;  Surgeon: Algernon Huxley, MD;  Location: [place] [facility];  Service: Cardiovascular;  Laterality: N/A;     FAMILY HISTORY   Family History  Problem Relation Age of Onset  . Breast cancer Mother 43  . Hypertension Father   . Mesothelioma Father   . Asthma Father   . Stroke Paternal Grandfather   . Heart disease Other   . Breast cancer Maternal Aunt   . Breast cancer Paternal Aunt  SOCIAL HISTORY   Social History   Tobacco Use  . Smoking status: Former    Packs/day: 0.50    Years: 30.00    Pack years: 15.00    Types: Cigarettes    Quit date: 10/02/2013    Years since quitting: 7.3  . Smokeless tobacco: Never  Vaping Use  . Vaping Use: Never used  Substance Use Topics  . Alcohol use: No  . Drug use: No     MEDICATIONS    Home Medication:    Current Medication:  Current Facility-Administered Medications:  .  0.9 %  sodium chloride infusion, 250 mL, Intravenous, PRN, Andrez Grime, MD .  acetaminophen (TYLENOL) tablet 650 mg, 650 mg, Oral, Q6H PRN, 650 mg at 02/12/21 0932 **OR** acetaminophen (TYLENOL) suppository 650 mg, 650 mg, Rectal, Q6H PRN, Andrez Grime, MD .  ALPRAZolam Duanne Moron) tablet 0.5 mg, 0.5 mg, Oral, TID PRN, Andrez Grime, MD, 0.5 mg at 02/12/21 0932 .  aspirin EC tablet 81 mg, 81 mg, Oral, Daily, Andrez Grime, MD, 81 mg at 02/12/21 0924 .  clopidogrel (PLAVIX) tablet 75 mg, 75 mg, Oral, Daily, Andrez Grime, MD, 75 mg at 02/12/21 0924 .  DULoxetine (CYMBALTA) DR capsule 20 mg, 20 mg, Oral, q AM, Andrez Grime, MD, 20 mg at 02/12/21 0923 .  fentaNYL (SUBLIMAZE) injection 25 mcg, 25 mcg, Intravenous, Q4H  PRN, Andrez Grime, MD .  FLUoxetine (PROZAC) capsule 40 mg, 40 mg, Oral, BID, Andrez Grime, MD, 40 mg at 02/12/21 0924 .  guaiFENesin (MUCINEX) 12 hr tablet 600 mg, 600 mg, Oral, BID, Andrez Grime, MD, 600 mg at 02/12/21 0924 .  insulin aspart (novoLOG) injection 0-20 Units, 0-20 Units, Subcutaneous, TID WC, Andrez Grime, MD, 11 Units at 02/12/21 [phone] .  insulin aspart (novoLOG) injection 0-5 Units, 0-5 Units, Subcutaneous, QHS, Andrez Grime, MD, 3 Units at 02/11/21 2141 .  insulin aspart protamine- aspart (NOVOLOG MIX 70/30) injection 75 Units, 75 Units, Subcutaneous, BID WC, Alekh, Kshitiz, MD .  ipratropium-albuterol (DUONEB) 0.5-2.5 (3) MG/3ML nebulizer solution 3 mL, 3 mL, Nebulization, Q4H PRN, Andrez Grime, MD .  levothyroxine (SYNTHROID) tablet 125 mcg, 125 mcg, Oral, Q0600, Andrez Grime, MD, 125 mcg at 02/12/21 0631 .  magnesium hydroxide (MILK OF MAGNESIA) suspension 30 mL, 30 mL, Oral, Daily PRN, Andrez Grime, MD .  methylPREDNISolone (MEDROL DOSEPAK) tablet 4 mg, 4 mg, Oral, 4X daily taper, Andrez Grime, MD, 4 mg at 02/12/21 0924 .  [DISCONTINUED] ondansetron (ZOFRAN) tablet 4 mg, 4 mg, Oral, Q6H PRN **OR** ondansetron (ZOFRAN) injection 4 mg, 4 mg, Intravenous, Q6H PRN, Andrez Grime, MD, 4 mg at 02/12/21 0915 .  ondansetron (ZOFRAN) tablet 4 mg, 4 mg, Oral, Q8H PRN, Andrez Grime, MD .  pantoprazole (PROTONIX) EC tablet 40 mg, 40 mg, Oral, Daily, Andrez Grime, MD, 40 mg at 02/12/21 0924 .  sodium chloride flush (NS) 0.9 % injection 3 mL, 3 mL, Intravenous, Q12H, Andrez Grime, MD, 3 mL at 02/12/21 0925 .  sodium chloride flush (NS) 0.9 % injection 3 mL, 3 mL, Intravenous, Q12H, Andrez Grime, MD, 3 mL at 02/12/21 0925 .  sodium chloride flush (NS) 0.9 % injection 3 mL, 3 mL, Intravenous, PRN, Andrez Grime, MD .  torsemide [facility]) tablet 20 mg, 20 mg, Oral, Daily, Andrez Grime,  MD, 20 mg at 02/12/21 0924 .  traZODone (DESYREL) tablet 200 mg, 200 mg, Oral,  Lorelee New, MD, 200 mg at 02/11/21 2138    ALLERGIES   Amitriptyline, Benadryl [diphenhydramine], Demerol [meperidine], Gabapentin, Meperidine hcl, Mirtazapine, Olanzapine, Voltaren [diclofenac sodium], Zetia [ezetimibe], Ativan [lorazepam], Atorvastatin, Budesonide-formoterol fumarate, Bupropion hcl, Caffeine, Codeine sulfate, Lisinopril, Metformin, Mometasone furoate, Morphine sulfate, Other, Oxycodone-acetaminophen, Pioglitazone, Propoxyphene n-acetaminophen, Rosuvastatin, Shellfish allergy, Suvorexant, Ticagrelor, Tramadol, Trazodone and nefazodone, Venlafaxine, Zolpidem tartrate, and Latex     REVIEW OF SYSTEMS    Review of Systems:  Gen:  Denies  fever, sweats, chills weigh loss  HEENT: Denies blurred vision, double vision, ear pain, eye pain, hearing loss, nose bleeds, sore throat Cardiac:  No dizziness, chest pain or heaviness, chest tightness,edema Resp:   Denies cough or sputum porduction, shortness of breath,wheezing, hemoptysis,  Gi: Denies swallowing difficulty, stomach pain, nausea or vomiting, diarrhea, constipation, bowel incontinence Gu:  Denies bladder incontinence, burning urine Ext:   Denies Joint pain, stiffness or swelling Skin: Denies  skin rash, easy bruising or bleeding or hives Endoc:  Denies polyuria, polydipsia , polyphagia or weight change Psych:   Denies depression, insomnia or hallucinations   Other:  All other systems negative   VS: BP 107/64 (BP Location: Left Arm)   Pulse 85   Temp 98.4 F (36.9 C)   Resp 18   Ht '5\' 6"'  (1.676 m)   Wt 90.7 kg   SpO2 96%   BMI 32.28 kg/m      PHYSICAL EXAM    GENERAL:NAD, no fevers, chills, no weakness no fatigue HEAD: Normocephalic, atraumatic.  EYES: Pupils equal, round, reactive to light. Extraocular muscles intact. No scleral icterus.  MOUTH: Moist mucosal membrane. Dentition intact. No abscess noted.   EAR, NOSE, THROAT: Clear without exudates. No external lesions.  NECK: Supple. No thyromegaly. No nodules. No JVD.  PULMONARY: Mild crackles bilaterally worse at bases posteriorly CARDIOVASCULAR: S1 and S2. Regular rate and rhythm. No murmurs, rubs, or gallops. No edema. Pedal pulses 2+ bilaterally.  GASTROINTESTINAL: Soft, nontender, nondistended. No masses. Positive bowel sounds. No hepatosplenomegaly.  MUSCULOSKELETAL: No swelling, clubbing, or edema. Range of motion full in all extremities.  NEUROLOGIC: Cranial nerves II through XII are intact. No gross focal neurological deficits. Sensation intact. Reflexes intact.  SKIN: No ulceration, lesions, rashes, or cyanosis. Skin warm and dry. Turgor intact.  PSYCHIATRIC: Mood, affect within normal limits. The patient is awake, alert and oriented x 3. Insight, judgment intact.       IMAGING    DG Chest 2 View  Result Date: 02/07/2021 CLINICAL DATA:  Shortness of breath.  Chest pain. EXAM: CHEST - 2 VIEW COMPARISON:  Most recent radiograph and CT 02/01/2021. FINDINGS: Stable prominent cardiac contours post aortic valve replacement and median sternotomy. Unchanged mediastinal contours. Chronic coarse lung markings. No confluent airspace disease, pleural effusion, or pneumothorax. Ground-glass airspace disease on prior CT are not well seen by radiograph. IMPRESSION: No acute chest finding.  Chronic coarse lung markings. Electronically Signed   By: Keith Rake M.D.   On: 02/07/2021 22:47   CT CHEST WO CONTRAST  Result Date: 02/01/2021 CLINICAL DATA:  COPD exacerbation shob and chest pain that started this arm. EXAM: CT CHEST WITHOUT CONTRAST TECHNIQUE: Multidetector CT imaging of the chest was performed following the standard protocol without IV contrast. COMPARISON:  CT angio chest 08/08/2020, CT angiography chest 06/15/2020 FINDINGS: Cardiovascular: Normal heart size. Status post aortic valve replacement. No significant pericardial effusion.  The thoracic aorta is normal in caliber. At least moderate atherosclerotic plaque of the thoracic aorta. Four-vessel coronary  artery calcifications status post coronary artery stents. The main pulmonary artery is enlarged in caliber measuring up to 3.5 cm. Mediastinum/Nodes: No gross hilar adenopathy, noting limited sensitivity for the detection of hilar adenopathy on this noncontrast study. No enlarged mediastinal or axillary lymph nodes. Nonspecific debris noted within the esophagus. The thyroid and trachea demonstrate no significant findings. Lungs/Pleura: No focal consolidation. Interval decrease in almost resolved patchy ground-glass airspace opacities with a persistent peribronchovascular nodular-like 1 cm right upper lobe lesion (3:54, 5:78). Interval development of a 1.3 cm ground-glass airspace opacity at the right apex (3:17, 5:104). Trace persistent interlobular septal wall thickening. No pulmonary mass. No pleural effusion. No pneumothorax. Upper Abdomen: Status post cholecystectomy.  No acute abnormality. Musculoskeletal: No chest wall abnormality. No suspicious lytic or blastic osseous lesions. No acute displaced fracture. Sternotomy wires are noted. IMPRESSION: 1. Interval development of a 1.3 cm ground-glass airspace opacity at the right apex. Persistent peribronchovascular nodular like 1 cm right upper lobe ground-glass airspace opacity. Adenocarcinoma cannot be excluded. Follow up by CT is recommended in 12 months, with continued annual surveillance for a minimum of 3 years. These recommendations are taken from: Recommendations for the Management of Subsolid Pulmonary Nodules Detected at CT: A Statement from the [facility] 2013; 266:1, 304-317. 2. Enlarged main pulmonary artery suggestive of hypertension. 3. Debris noted within the esophagus. 4. Aortic Atherosclerosis (ICD10-I70.0) including four-vessel coronary artery calcifications status post stent. 5. Status aortic valve  replacement. Electronically Signed   By: Iven Finn M.D.   On: 02/01/2021 17:29   CT Angio Chest PE W and/or Wo Contrast  Result Date: 02/08/2021 CLINICAL DATA:  PE suspected, high prob. Dyspnea. Nonproductive cough. EXAM: CT ANGIOGRAPHY CHEST WITH CONTRAST TECHNIQUE: Multidetector CT imaging of the chest was performed using the standard protocol during bolus administration of intravenous contrast. Multiplanar CT image reconstructions and MIPs were obtained to evaluate the vascular anatomy. CONTRAST:  66m OMNIPAQUE IOHEXOL 350 MG/ML SOLN COMPARISON:  02/01/2021 FINDINGS: Cardiovascular: There is adequate opacification of the pulmonary arterial tree. No intraluminal filling defect through the segmental level to suggest acute pulmonary embolism. The central pulmonary arteries are enlarged in keeping with changes of pulmonary arterial hypertension. Extensive multi-vessel coronary artery calcification is noted. Probable stenting of the proximal right and left circumflex coronary arteries. Coronary artery bypass grafting and aortic valve replacement has been performed. Mild global cardiomegaly is present. Moderate largely noncalcified atheromatous plaque seen throughout the aorta. No aortic aneurysm. Mediastinum/Nodes: No pathologic thoracic adenopathy. The esophagus is unremarkable. Lungs/Pleura: Imaging is slightly limited by motion artifact. There is bilateral asymmetric patchy ground-glass pulmonary infiltrate more focal within the mid lung zones bilaterally possibly infectious or inflammatory in nature as can be seen with COVID-19 pneumonia. This has progressed since prior examination. No pneumothorax or pleural effusion. Central airways are widely patent. Upper Abdomen: Cholecystectomy has been performed. No acute abnormality. Musculoskeletal: No acute bone abnormality. No lytic or blastic bone lesion. Review of the MIP images confirms the above findings. IMPRESSION: No pulmonary embolism. Mild  cardiomegaly. Morphologic changes in keeping with pulmonary arterial hypertension. Progressive patchy bilateral mid lung zone ground-glass pulmonary infiltrate, possibly infectious or inflammatory in nature. Serologic correlation for COVID-19 pneumonia may be helpful. Aortic Atherosclerosis (ICD10-I70.0). Electronically Signed   By: AFidela SalisburyM.D.   On: 02/08/2021 02:55   NM Pulmonary Perfusion  Result Date: 01/30/2021 CLINICAL DATA:  Shortness of breath EXAM: NUCLEAR MEDICINE PERFUSION LUNG SCAN TECHNIQUE: Perfusion images were obtained in multiple projections after intravenous injection  of radiopharmaceutical. Ventilation scans intentionally deferred if perfusion scan and chest x-ray adequate for interpretation during COVID 19 epidemic. RADIOPHARMACEUTICALS:  4 mCi Tc-68mMAA IV COMPARISON:  01/28/2021 FINDINGS: There is adequate uptake of radioactive tracer throughout both lungs. No focal defect to suggest pulmonary embolism is noted. Cardiac shadow is enlarged. IMPRESSION: No focal defect to suggest pulmonary embolism. Electronically Signed   By: MInez CatalinaM.D.   On: 01/30/2021 16:07   CARDIAC CATHETERIZATION  Result Date: 02/10/2021 .  Mid LAD lesion is 100% stenosed. .Colon FlatteryRCA lesion is 99% stenosed. .Colon FlatteryRCA to Prox RCA lesion is 20% stenosed. .  Prox RCA lesion is 80% stenosed. .  Mid RCA lesion is 100% stenosed. .  Prox Cx to Mid Cx lesion is 50% stenosed. .  Previously placed Origin to Prox Graft stent (unknown type) is  widely patent. .  Previously placed Dist Graft to Insertion stent (unknown type) is  widely patent. .  Previously placed Prox Cx stent (unknown type) is  widely patent. .  SVG and is large. .Marland Kitchen LIMA and is normal in caliber and large. Patient agitation and frequent movement made the procedure technically difficult. Conclusion: Severe native three-vessel coronary artery disease including CTO of the mid LAD, CTO of the mid RCA, and patent stent in the mid left circumflex.  Patent SVG to RCA with 2 patent stents in the proximal and distal segments. Patent LIMA to LAD RA 6, RV 38/5, PA 37/16 (25), PCWP 15 Fick CO equals 3.91, Fick CI 1.93 Recommendation Unable to cross the aortic valve during this procedure.  She needs additional evaluation to determine the severity of her aortic valve stenosis.  Likely we will perform a TEE next week. She has been appropriately diuresed with a RA pressure of 6 and pulmonary capillary wedge pressure 15.  I have ordered her home torsemide of 20 mg daily to be started tomorrow. Continue aspirin and Plavix as previously prescribed for stent placed in June 2022   DG Chest Port 1 View  Result Date: 02/09/2021 CLINICAL DATA:  Bilateral pulmonary infiltrates on CXR EXAM: PORTABLE CHEST - 1 VIEW COMPARISON:  CT from previous day FINDINGS: Coarse nodular airspace opacities throughout both lung fields as before. No pleural effusion. No pneumothorax. Heart size upper limits normal.  Right hilar fullness. Sternotomy wires. Visualized bones unremarkable. IMPRESSION: 1. Persistent coarse nodular bilateral airspace opacities. 2. Right hilar fullness presumably related to regional airspace disease. Electronically Signed   By: DLucrezia EuropeM.D.   On: 02/09/2021 08:00   DG Chest Port 1 View  Result Date: 02/01/2021 CLINICAL DATA:  75year old female with history of shortness of breath. COPD. EXAM: PORTABLE CHEST 1 VIEW COMPARISON:  Chest x-ray 01/30/2021. FINDINGS: Lung volumes are normal. No consolidative airspace disease. No pleural effusions. Diffuse interstitial prominence and widespread peribronchial cuffing. No pneumothorax. No definite suspicious appearing pulmonary nodules or masses are noted. No evidence of pulmonary edema. Mild cardiomegaly. Upper mediastinal contours are within normal limits. Status post hemi median sternotomy for aortic valve replacement with what appears to be a stented bioprosthesis. IMPRESSION: 1. Diffuse interstitial prominence and  peribronchial cuffing similar to prior studies suggestive of probable chronic bronchitis. 2. Cardiomegaly. Electronically Signed   By: DVinnie LangtonM.D.   On: 02/01/2021 10:49   DG Chest Port 1 View  Result Date: 01/30/2021 CLINICAL DATA:  Post VQ scan EXAM: PORTABLE CHEST 1 VIEW COMPARISON:  01/28/2021, 11/24/2020, 03/04/2018 FINDINGS: Post sternotomy changes with valve  replacement. Cardiomegaly with mild vascular congestion. Diffuse interstitial opacities some of which is felt due to chronic disease. No pleural effusion or pneumothorax. IMPRESSION: 1. Cardiomegaly with slight central congestion and chronic interstitial opacity. Electronically Signed   By: Donavan Foil M.D.   On: 01/30/2021 16:34   DG Chest Port 1 View  Result Date: 01/28/2021 CLINICAL DATA:  Difficulty breathing.  Shortness of breath. EXAM: PORTABLE CHEST 1 VIEW COMPARISON:  November 24, 2020 FINDINGS: Stable cardiomegaly. The hila and mediastinum are normal. No pneumothorax. No nodules or masses. No focal infiltrates. Increased interstitial opacities in the lungs suggesting mild edema. IMPRESSION: Cardiomegaly and mild pulmonary edema. No other acute abnormalities. Electronically Signed   By: Dorise Bullion III M.D.   On: 01/28/2021 19:17   ECHOCARDIOGRAM COMPLETE  Result Date: 02/02/2021    ECHOCARDIOGRAM REPORT   Patient Name:   [name] Date of Exam: 02/02/2021 Medical Rec #:  [id number]     Height:       66.0 in Accession #:    [id number]    Weight:       203.0 lb Date of Birth:  [birth year]      BSA:          2.012 m Patient Age:    81 years      BP:           144/61 mmHg Patient Gender: F             HR:           74 bpm. Exam Location:  [facility] Procedure: 2D Echo, Color Doppler and Cardiac Doppler Indications:     Chest pain R07.9  History:         Patient has prior history of Echocardiogram examinations, most                  recent 11/25/2020. Prior CABG, COPD; Risk Factors:Hypertension.  Sonographer:     Sherrie Sport Referring  Phys:  [id number] SARA-MAIZ A THOMAS Diagnosing Phys: Serafina Royals MD  Sonographer Comments: Technically challenging study due to limited acoustic windows, suboptimal parasternal window and no apical window. Image acquisition challenging due to COPD. IMPRESSIONS  1. Left ventricular ejection fraction, by estimation, is 55 to 60%. The left ventricle has normal function. The left ventricle has no regional wall motion abnormalities. Left ventricular diastolic parameters were normal.  2. Right ventricular systolic function is normal. The right ventricular size is normal.  3. The mitral valve is normal in structure. Mild mitral valve regurgitation. Moderate mitral annular calcification.  4. The aortic valve is normal in structure. Aortic valve regurgitation is not visualized. FINDINGS  Left Ventricle: Left ventricular ejection fraction, by estimation, is 55 to 60%. The left ventricle has normal function. The left ventricle has no regional wall motion abnormalities. The left ventricular internal cavity size was normal in size. There is  no left ventricular hypertrophy. Left ventricular diastolic parameters were normal. Right Ventricle: The right ventricular size is normal. No increase in right ventricular wall thickness. Right ventricular systolic function is normal. Left Atrium: Left atrial size was normal in size. Right Atrium: Right atrial size was normal in size. Pericardium: There is no evidence of pericardial effusion. Mitral Valve: The mitral valve is normal in structure. Moderate mitral annular calcification. Mild mitral valve regurgitation. Tricuspid Valve: The tricuspid valve is normal in structure. Tricuspid valve regurgitation is mild. Aortic Valve: The aortic valve is normal in structure. Aortic valve regurgitation is not visualized. Pulmonic  Valve: The pulmonic valve was normal in structure. Pulmonic valve regurgitation is not visualized. Aorta: The aortic root and ascending aorta are structurally normal,  with no evidence of dilitation. IAS/Shunts: No atrial level shunt detected by color flow Doppler.  LEFT VENTRICLE PLAX 2D LVIDd:         4.40 cm LVIDs:         3.10 cm LV PW:         1.50 cm LV IVS:        1.80 cm LVOT diam:     2.10 cm LVOT Area:     3.46 cm  LEFT ATRIUM         Index LA diam:    4.70 cm 2.34 cm/m                        PULMONIC VALVE AORTA                 PV Vmax:        0.84 m/s Ao Root diam: 3.10 cm PV Peak grad:   2.8 mmHg                       RVOT Peak grad: 5 mmHg   SHUNTS Systemic Diam: 2.10 cm Serafina Royals MD Electronically signed by Serafina Royals MD Signature Date/Time: 02/02/2021/4:41:25 PM    Final            ASSESSMENT/PLAN   Acute on chronic hypoxemic respiratory faiulre  -patient has severe multi vessel CAD s/p CABG - cardiology on case - acutely there is evidence of pulmonary edema.  -she had episode of respiratory distress with recurrent panic attack induced hyperventilation -patient does have moderate pulmonary hypertension on RHC -vital were rechecked during my examination and were stable   Moderate pulmonary hypertension   - sildenafil 20 TID post diuresis  Acute pulmonary edema   -recheck TTE    Severe CAD with Aortic valve dysfunction  TEE scheduled s/p LHC Hx of CABG  Acute exacerbation of COPD with chronic hypoxemia   -  soluemdrol changed to tapering dose pack.  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  Will dc busbar as patient still with sgnificant anxiety, starting xanax low dose tid to help temporarily with anxiety while hospitalized.  Due to recurrent panic attacks she may benefit from proper psychiatric evaluation and medication optimization .  -s/p psychiatric evaluation , everything appeared good no need for any new therapy so this points away from panic attack induced dyspnea    Thank you for allowing me to participate in the care of this patient.   Patient/Family are satisfied with care plan and all questions have been answered.  This document was prepared using Dragon voice recognition software and may include unintentional dictation errors.     Ottie Glazier, M.D.  Division of [facility]

## 2021-02-12 NOTE — Progress Notes (Signed)
Patient ID: [name], female   DOB: 1946/05/22, 75 y.o.   MRN: [id number]  PROGRESS NOTE    [name]  [id number] DOB: Jul 30, 1945 DOA: 02/08/2021 PCP: Rusty Aus, MD   Brief Narrative:  75 year old female with history of asthma/COPD, CAD status post CABG and PCI, diabetes mellitus type 2, GERD, headache, anxiety, recent 2 hospitalizations for COPD exacerbation treated with steroids presented again with worsening shortness of breath.  On presentation, high-sensitivity troponin was 101 113 with BNP of 423.  Stool Hemoccult was positive.  Chest x-ray showed chronic coarse lung markings with no acute chest findings.  She was treated with IV Lasix and Solu-Medrol.  Cardiology/GI/pulmonary were consulted.  Assessment & Plan:   Possible COPD exacerbation Chronic respiratory failure with hypoxia -CTA chest showed no pulmonary embolism but showed progressive patchy bilateral mid lung zone groundglass pulmonary infiltrate, possibly infectious or inflammatory in nature.  COVID-19 test was negative.  Pulmonary following.  IV Solu-Medrol has been switched to tapering Medrol dose pack by pulmonary on 02/09/2021.  Continue nebs -Patient on 3 L oxygen by nasal cannula at home.  Currently on 2 L oxygen.  Chest tightness/positive troponin in patient with CAD status post PCI and CABG and AVR -Cardiology following.  High-sensitivity troponins peaked at 1360 on 02/09/2021.  -Status post cardiac catheterization on 02/10/2021 by cardiology which showed severe native three-vessel CAD with patent grafts.  Unable to cross the aortic valve during the procedure; cardiology planning to perform TEE next week.  Continue aspirin and Plavix.    Acute pulmonary edema -Strict input and output.  Daily weights.  Fluid restriction. -Home torsemide has been resumed.  Hyperkalemia -Resolved.  DC'd potassium supplement.   Chronic kidney stage IIIb -Creatinine stable.  Monitor  Anemia of chronic disease Fecal occult  blood positive in stool -Patient has chronic anemia with only mild drop in hemoglobin.  No evidence of overt GI bleeding.  GI consult appreciated.  No need for inpatient procedures.  Antiplatelets have been resumed. -Hemoglobin 10.3 today.  Leukocytosis -Monitor  Hyponatremia -Monitor.  Improving.  Diabetes mellitus type 2 with hyperglycemia -Hyperglycemia aggravated by steroid use.  Increase NPH to 75 units twice a day; continue CBGs with SSI.  Depression -Continue Prozac and Cymbalta.  Low-dose Xanax has been started by pulmonary which will be continued.  Psychiatry consultation appreciated: Continue current regimen.  Will need outpatient follow-up with PCP/psychiatry.    Generalized deconditioning -Overall prognosis is guarded to poor.   -Palliative care consultation appreciated: CODE STATUS has been changed to DNR -PT recommends home health PT  Hypertension -Blood pressure stable.  Continue torsemide.  Hypothyroidism--continue Synthroid   DVT prophylaxis: SCDs Code Status: Full Family Communication: Husband at bedside on 02/12/2021. Disposition Plan: Status is: Inpatient  Remains inpatient appropriate because:Inpatient level of care appropriate due to severity of illness  Dispo: The patient is from: Home              Anticipated d/c is to: Home              Patient currently is not medically stable to d/c.   Difficult to place patient No   Consultants: Cardiology/GI/pulmonary  Procedures: None  Antimicrobials: None   Subjective: Patient seen and examined at bedside.  Poor historian.  No fever, chest pain, worsening shortness of breath reported.  Feels anxious and wants to go home.  Objective: Vitals:   02/11/21 2122 02/12/21 0509 02/12/21 0637 02/12/21 0734  BP: 129/60  123/64 107/64  Pulse: 76  87 85  Resp: '18  20 18  '$ Temp: 98.6 F (37 C)  98.2 F (36.8 C) 98.4 F (36.9 C)  TempSrc: Oral  Oral   SpO2: 97%  98% 96%  Weight:  90.7 kg    Height:         Intake/Output Summary (Last 24 hours) at 02/12/2021 0815 Last data filed at 02/12/2021 0649 Gross per 24 hour  Intake 1920 ml  Output 3600 ml  Net -1680 ml    Filed Weights   02/10/21 0055 02/11/21 0309 02/12/21 0509  Weight: 92.9 kg 97.3 kg 90.7 kg    Examination:  General exam: No distress.  Still on 2 L oxygen via nasal cannula.  Looks chronically ill and deconditioned.  Hard of hearing Respiratory system: Decreased breath sounds at bases bilaterally with some crackles  cardiovascular system: Rate controlled, S1-S2 heard gastrointestinal system: Abdomen is distended mildly, soft and nontender.  Bowel sounds are heard  extremities: Lower extremity edema present bilaterally; no clubbing Central nervous system: Awake; still very slow to respond.  Poor historian.  No focal neurological deficits.  Moves extremities Skin: No obvious ecchymosis/lesions  psychiatry: Affect is mostly flat   data Reviewed: I have personally reviewed following labs and imaging studies  CBC: Recent Labs  Lab 02/08/21 0630 02/09/21 0427 02/10/21 0731 02/11/21 0553 02/12/21 0420  WBC 9.4 14.4* 16.3* 14.3* 15.6*  NEUTROABS  --  13.1* 13.7* 12.0* 12.5*  HGB 8.9* 9.0* 9.8* 11.2* 10.3*  HCT 27.4* 26.3* 29.0* 32.9* 31.1*  MCV 86.2 84.8 84.5 86.8 86.6  PLT 171 196 211 241 [id number]    Basic Metabolic Panel: Recent Labs  Lab 02/08/21 0630 02/09/21 0427 02/10/21 0731 02/11/21 0553 02/12/21 0420  NA 131* 130* 127* 133* 134*  K 4.7 4.5 5.0 5.5* 4.9  CL 96* 93* 88* 93* 93*  CO2 '25 28 31 '$ 34* 32  GLUCOSE 429* 381* 396* 257* 294*  BUN 20 31* 34* 35* 34*  CREATININE 1.22* 1.27* 1.14* 1.28* 1.24*  CALCIUM 7.9* 7.9* 8.4* 8.2* 7.8*  MG  --  1.8 2.1 2.2 2.1    GFR: Estimated Creatinine Clearance: 43.8 mL/min (A) (by C-G formula based on SCr of 1.24 mg/dL (H)). Liver Function Tests: No results for input(s): AST, ALT, ALKPHOS, BILITOT, PROT, ALBUMIN in the last 168 hours. No results for input(s): LIPASE,  AMYLASE in the last 168 hours. No results for input(s): AMMONIA in the last 168 hours. Coagulation Profile: No results for input(s): INR, PROTIME in the last 168 hours. Cardiac Enzymes: No results for input(s): CKTOTAL, CKMB, CKMBINDEX, TROPONINI in the last 168 hours. BNP (last 3 results) No results for input(s): PROBNP in the last 8760 hours. HbA1C: No results for input(s): HGBA1C in the last 72 hours.  CBG: Recent Labs  Lab 02/11/21 0724 02/11/21 1112 02/11/21 1620 02/11/21 2055 02/12/21 0736  GLUCAP 249* 384* 307* 287* 271*    Lipid Profile: No results for input(s): CHOL, HDL, LDLCALC, TRIG, CHOLHDL, LDLDIRECT in the last 72 hours. Thyroid Function Tests: No results for input(s): TSH, T4TOTAL, FREET4, T3FREE, THYROIDAB in the last 72 hours. Anemia Panel: No results for input(s): VITAMINB12, FOLATE, FERRITIN, TIBC, IRON, RETICCTPCT in the last 72 hours. Sepsis Labs: No results for input(s): PROCALCITON, LATICACIDVEN in the last 168 hours.  Recent Results (from the past 240 hour(s))  Resp Panel by RT-PCR (Flu A&B, Covid) Nasopharyngeal Swab     Status: None   Collection Time: 02/08/21  2:00 AM  Specimen: Nasopharyngeal Swab; Nasopharyngeal(NP) swabs in vial transport medium  Result Value Ref Range Status   SARS Coronavirus 2 by RT PCR NEGATIVE NEGATIVE Final    Comment: (NOTE) SARS-CoV-2 target nucleic acids are NOT DETECTED.  The SARS-CoV-2 RNA is generally detectable in upper respiratory specimens during the acute phase of infection. The lowest concentration of SARS-CoV-2 viral copies this assay can detect is 138 copies/mL. A negative result does not preclude SARS-Cov-2 infection and should not be used as the sole basis for treatment or other patient management decisions. A negative result may occur with  improper specimen collection/handling, submission of specimen other than nasopharyngeal swab, presence of viral mutation(s) within the areas targeted by this  assay, and inadequate number of viral copies(<138 copies/mL). A negative result must be combined with clinical observations, patient history, and epidemiological information. The expected result is Negative.  Fact Sheet for Patients:  EntrepreneurPulse.com.au  Fact Sheet for Healthcare Providers:  IncredibleEmployment.be  This test is no t yet approved or cleared by the [place] FDA and  has been authorized for detection and/or diagnosis of SARS-CoV-2 by FDA under an Emergency Use Authorization (EUA). This EUA will remain  in effect (meaning this test can be used) for the duration of the COVID-19 declaration under Section 564(b)(1) of the Act, 21 U.S.C.section 360bbb-3(b)(1), unless the authorization is terminated  or revoked sooner.       Influenza A by PCR NEGATIVE NEGATIVE Final   Influenza B by PCR NEGATIVE NEGATIVE Final    Comment: (NOTE) The Xpert Xpress SARS-CoV-2/FLU/RSV plus assay is intended as an aid in the diagnosis of influenza from Nasopharyngeal swab specimens and should not be used as a sole basis for treatment. Nasal washings and aspirates are unacceptable for Xpert Xpress SARS-CoV-2/FLU/RSV testing.  Fact Sheet for Patients: EntrepreneurPulse.com.au  Fact Sheet for Healthcare Providers: IncredibleEmployment.be  This test is not yet approved or cleared by the [place] FDA and has been authorized for detection and/or diagnosis of SARS-CoV-2 by FDA under an Emergency Use Authorization (EUA). This EUA will remain in effect (meaning this test can be used) for the duration of the COVID-19 declaration under Section 564(b)(1) of the Act, 21 U.S.C. section 360bbb-3(b)(1), unless the authorization is terminated or revoked.  Performed at [facility], [address] [place], [place] [address]           Radiology Studies: CARDIAC CATHETERIZATION  Result Date:  02/10/2021   Mid LAD lesion is 100% stenosed.   Ost RCA lesion is 99% stenosed.   Ost RCA to Prox RCA lesion is 20% stenosed.   Prox RCA lesion is 80% stenosed.   Mid RCA lesion is 100% stenosed.   Prox Cx to Mid Cx lesion is 50% stenosed.   Previously placed Origin to Prox Graft stent (unknown type) is  widely patent.   Previously placed Dist Graft to Insertion stent (unknown type) is  widely patent.   Previously placed Prox Cx stent (unknown type) is  widely patent.   SVG and is large.   LIMA and is normal in caliber and large. Patient agitation and frequent movement made the procedure technically difficult. Conclusion: Severe native three-vessel coronary artery disease including CTO of the mid LAD, CTO of the mid RCA, and patent stent in the mid left circumflex. Patent SVG to RCA with 2 patent stents in the proximal and distal segments. Patent LIMA to LAD RA 6, RV 38/5, PA 37/16 (25), PCWP 15 Fick CO equals 3.91, Fick CI 1.93  Recommendation Unable to cross the aortic valve during this procedure.  She needs additional evaluation to determine the severity of her aortic valve stenosis.  Likely we will perform a TEE next week. She has been appropriately diuresed with a RA pressure of 6 and pulmonary capillary wedge pressure 15.  I have ordered her home torsemide of 20 mg daily to be started tomorrow. Continue aspirin and Plavix as previously prescribed for stent placed in June 2022        Scheduled Meds:  aspirin EC  81 mg Oral Daily   clopidogrel  75 mg Oral Daily   DULoxetine  20 mg Oral q AM   FLUoxetine  40 mg Oral BID   guaiFENesin  600 mg Oral BID   insulin aspart  0-20 Units Subcutaneous TID WC   insulin aspart  0-5 Units Subcutaneous QHS   insulin aspart protamine- aspart  70 Units Subcutaneous BID WC   levothyroxine  125 mcg Oral Q0600   methylPREDNISolone  4 mg Oral 4X daily taper   pantoprazole  40 mg Oral Daily   sodium chloride flush  3 mL Intravenous Q12H   sodium chloride flush  3 mL  Intravenous Q12H   torsemide  20 mg Oral Daily   traZODone  200 mg Oral QHS   Continuous Infusions:  sodium chloride            Aline August, MD [facility] 02/12/2021, 8:15 AM

## 2021-02-12 NOTE — Progress Notes (Signed)
[facility] Cardiology    SUBJECTIVE: Patient feels reasonably well denies chest pain no shortness of breath no leg edema denies palpitations or tachycardia   Vitals:   02/12/21 0509 02/12/21 0637 02/12/21 0734 02/12/21 1146  BP:  123/64 107/64 (!) 114/56  Pulse:  87 85 78  Resp:  '20 18 20  '$ Temp:  98.2 F (36.8 C) 98.4 F (36.9 C) 98.1 F (36.7 C)  TempSrc:  Oral  Oral  SpO2:  98% 96% 95%  Weight: 90.7 kg     Height:         Intake/Output Summary (Last 24 hours) at 02/12/2021 1158 Last data filed at 02/12/2021 1000 Gross per 24 hour  Intake 1920 ml  Output 2950 ml  Net -1030 ml      PHYSICAL EXAM  General: Well developed, well nourished, in no acute distress HEENT:  Normocephalic and atramatic Neck:  No JVD.  Lungs: Clear bilaterally to auscultation and percussion. Heart: HRRR . Normal S1 and S2 without gallops or murmurs.  Abdomen: Bowel sounds are positive, abdomen soft and non-tender  Msk:  Back normal, normal gait. Normal strength and tone for age. Extremities: No clubbing, cyanosis or edema.   Neuro: Alert and oriented X 3. Psych:  Good affect, responds appropriately   LABS: Basic Metabolic Panel: Recent Labs    02/11/21 0553 02/12/21 0420  NA 133* 134*  K 5.5* 4.9  CL 93* 93*  CO2 34* 32  GLUCOSE 257* 294*  BUN 35* 34*  CREATININE 1.28* 1.24*  CALCIUM 8.2* 7.8*  MG 2.2 2.1   Liver Function Tests: No results for input(s): AST, ALT, ALKPHOS, BILITOT, PROT, ALBUMIN in the last 72 hours. No results for input(s): LIPASE, AMYLASE in the last 72 hours. CBC: Recent Labs    02/11/21 0553 02/12/21 0420  WBC 14.3* 15.6*  NEUTROABS 12.0* 12.5*  HGB 11.2* 10.3*  HCT 32.9* 31.1*  MCV 86.8 86.6  PLT 241 222   Cardiac Enzymes: No results for input(s): CKTOTAL, CKMB, CKMBINDEX, TROPONINI in the last 72 hours. BNP: Invalid input(s): POCBNP D-Dimer: No results for input(s): DDIMER in the last 72 hours. Hemoglobin A1C: No results for input(s): HGBA1C in the  last 72 hours. Fasting Lipid Panel: No results for input(s): CHOL, HDL, LDLCALC, TRIG, CHOLHDL, LDLDIRECT in the last 72 hours. Thyroid Function Tests: No results for input(s): TSH, T4TOTAL, T3FREE, THYROIDAB in the last 72 hours.  Invalid input(s): FREET3 Anemia Panel: No results for input(s): VITAMINB12, FOLATE, FERRITIN, TIBC, IRON, RETICCTPCT in the last 72 hours.  CARDIAC CATHETERIZATION  Result Date: 02/10/2021   Mid LAD lesion is 100% stenosed.   Ost RCA lesion is 99% stenosed.   Ost RCA to Prox RCA lesion is 20% stenosed.   Prox RCA lesion is 80% stenosed.   Mid RCA lesion is 100% stenosed.   Prox Cx to Mid Cx lesion is 50% stenosed.   Previously placed Origin to Prox Graft stent (unknown type) is  widely patent.   Previously placed Dist Graft to Insertion stent (unknown type) is  widely patent.   Previously placed Prox Cx stent (unknown type) is  widely patent.   SVG and is large.   LIMA and is normal in caliber and large. Patient agitation and frequent movement made the procedure technically difficult. Conclusion: Severe native three-vessel coronary artery disease including CTO of the mid LAD, CTO of the mid RCA, and patent stent in the mid left circumflex. Patent SVG to RCA with 2 patent stents in the  proximal and distal segments. Patent LIMA to LAD RA 6, RV 38/5, PA 37/16 (25), PCWP 15 Fick CO equals 3.91, Fick CI 1.93 Recommendation Unable to cross the aortic valve during this procedure.  She needs additional evaluation to determine the severity of her aortic valve stenosis.  Likely we will perform a TEE next week. She has been appropriately diuresed with a RA pressure of 6 and pulmonary capillary wedge pressure 15.  I have ordered her home torsemide of 20 mg daily to be started tomorrow. Continue aspirin and Plavix as previously prescribed for stent placed in June 2022     Echo LV function is 55%  TELEMETRY: Normal sinus rhythm rate of 80:  ASSESSMENT AND PLAN:  Principal Problem:    GAD (generalized anxiety disorder) Active Problems:   COPD exacerbation (HCC) Aortic valve disease Status post aortic valve replacement Coronary bypass surgery History of non-STEMI Previous GI bleeding  Plan Continue telemetry Continue inhalers necessary for COPD symptom Recommend proceed with TEE on Monday to evaluate aortic valve Agree with diabetes management and control Sleep study for obstructive sleep apnea CPAP is indicated Recommend weight loss exercise portion control Continue hypertension management and control   Yolonda Kida, MD, 02/12/2021 11:58 AM

## 2021-02-13 ENCOUNTER — Encounter
Admission: EM | Disposition: A | Discharge status: Home / Self Care | Payer: Self-pay | Source: Home / Self Care | Attending: Internal Medicine

## 2021-02-13 ENCOUNTER — Inpatient Hospital Stay
Admit: 2021-02-13 | Discharge: 2021-02-13 | Disposition: A | Discharge status: Home / Self Care | Payer: Medicare HMO | Attending: Cardiology | Admitting: Cardiology

## 2021-02-13 ENCOUNTER — Encounter: Payer: Self-pay | Admitting: Cardiology

## 2021-02-13 ENCOUNTER — Inpatient Hospital Stay: Admit: 2021-02-13 | Payer: Medicare HMO

## 2021-02-13 DIAGNOSIS — J9611 Chronic respiratory failure with hypoxia: Secondary | ICD-10-CM | POA: Diagnosis not present

## 2021-02-13 DIAGNOSIS — J441 Chronic obstructive pulmonary disease with (acute) exacerbation: Secondary | ICD-10-CM | POA: Diagnosis not present

## 2021-02-13 DIAGNOSIS — R079 Chest pain, unspecified: Secondary | ICD-10-CM | POA: Diagnosis not present

## 2021-02-13 HISTORY — PX: TEE WITHOUT CARDIOVERSION: SHX5443

## 2021-02-13 LAB — CBC WITH DIFFERENTIAL/PLATELET
Abs Immature Granulocytes: 0.2 10*3/uL — ABNORMAL HIGH (ref 0.00–0.07)
Basophils Absolute: 0.1 10*3/uL (ref 0.0–0.1)
Basophils Relative: 0 %
Eosinophils Absolute: 0.4 10*3/uL (ref 0.0–0.5)
Eosinophils Relative: 2 %
HCT: 31.3 % — ABNORMAL LOW (ref 36.0–46.0)
Hemoglobin: 10.3 g/dL — ABNORMAL LOW (ref 12.0–15.0)
Immature Granulocytes: 1 %
Lymphocytes Relative: 11 %
Lymphs Abs: 1.8 10*3/uL (ref 0.7–4.0)
MCH: 28.2 pg (ref 26.0–34.0)
MCHC: 32.9 g/dL (ref 30.0–36.0)
MCV: 85.8 fL (ref 80.0–100.0)
Monocytes Absolute: 0.8 10*3/uL (ref 0.1–1.0)
Monocytes Relative: 5 %
Neutro Abs: 12.8 10*3/uL — ABNORMAL HIGH (ref 1.7–7.7)
Neutrophils Relative %: 81 %
Platelets: 224 10*3/uL (ref 150–400)
RBC: 3.65 MIL/uL — ABNORMAL LOW (ref 3.87–5.11)
RDW: 13.3 % (ref 11.5–15.5)
WBC: 16 10*3/uL — ABNORMAL HIGH (ref 4.0–10.5)
nRBC: 0 % (ref 0.0–0.2)

## 2021-02-13 LAB — BASIC METABOLIC PANEL
Anion gap: 6 (ref 5–15)
BUN: 30 mg/dL — ABNORMAL HIGH (ref 8–23)
CO2: 33 mmol/L — ABNORMAL HIGH (ref 22–32)
Calcium: 7.9 mg/dL — ABNORMAL LOW (ref 8.9–10.3)
Chloride: 95 mmol/L — ABNORMAL LOW (ref 98–111)
Creatinine, Ser: 1.09 mg/dL — ABNORMAL HIGH (ref 0.44–1.00)
GFR, Estimated: 53 mL/min — ABNORMAL LOW (ref >60)
Glucose, Bld: 241 mg/dL — ABNORMAL HIGH (ref 70–99)
Potassium: 4.6 mmol/L (ref 3.5–5.1)
Sodium: 134 mmol/L — ABNORMAL LOW (ref 135–145)

## 2021-02-13 LAB — ECHOCARDIOGRAM COMPLETE
AR max vel: 1.56 cm2
AV Area VTI: 1.74 cm2
AV Area mean vel: 1.61 cm2
AV Mean grad: 18.5 mmHg
AV Peak grad: 33.9 mmHg
Ao pk vel: 2.91 m/s
Height: 66 in
Weight: 3200 oz

## 2021-02-13 LAB — GLUCOSE, CAPILLARY
Glucose-Capillary: 191 mg/dL — ABNORMAL HIGH (ref 70–99)
Glucose-Capillary: 157 mg/dL — ABNORMAL HIGH (ref 70–99)

## 2021-02-13 LAB — MAGNESIUM: Magnesium: 2.1 mg/dL (ref 1.7–2.4)

## 2021-02-13 SURGERY — ECHOCARDIOGRAM, TRANSESOPHAGEAL
Anesthesia: Moderate Sedation

## 2021-02-13 MED ORDER — MIDAZOLAM HCL 2 MG/2ML IJ SOLN
INTRAMUSCULAR | Status: AC
  Filled 2021-02-13: qty 2

## 2021-02-13 MED ORDER — BUTAMBEN-TETRACAINE-BENZOCAINE 2-2-14 % EX AERO
INHALATION_SPRAY | CUTANEOUS | Status: AC
  Filled 2021-02-13: qty 5

## 2021-02-13 MED ORDER — SODIUM CHLORIDE 0.9 % IV SOLN: INTRAVENOUS | Status: DC

## 2021-02-13 MED ORDER — METHYLPREDNISOLONE 4 MG PO TBPK: ORAL_TABLET | ORAL | 0 refills | Status: DC

## 2021-02-13 MED ORDER — FENTANYL CITRATE (PF) 100 MCG/2ML IJ SOLN
INTRAMUSCULAR | Status: AC
  Filled 2021-02-13: qty 2

## 2021-02-13 MED ORDER — GUAIFENESIN ER 600 MG PO TB12: 1200.0000 mg | ORAL_TABLET | Freq: Two times a day (BID) | ORAL | 0 refills | Status: DC

## 2021-02-13 MED ORDER — LIDOCAINE VISCOUS HCL 2 % MT SOLN
OROMUCOSAL | Status: AC
  Filled 2021-02-13: qty 15

## 2021-02-13 NOTE — Progress Notes (Signed)
Pulmonary Medicine          Date: 02/13/2021,   MRN# [id number] JASHA HODZIC 1945-09-08     AdmissionWeight: 90.7 kg                 CurrentWeight: 90.7 kg   Referring physician: Dr Remi Haggard   CHIEF COMPLAINT:   Respiratory distress with Acute exacerbation of COPD   HISTORY OF PRESENT ILLNESS   75 yo with hx of CABG 2015 , depression,anxiety, hypertension, OSA, COPD, iron deficiency anemia, aortic dilatation, presents emergency department for cc of dyspnea and sob. She is chronically hypoxemic with 3L/min [place] requirement. At bedside, she is no longer tripoding and resting in ED bed and does not appear to be in acute distress. She fully participated with physical exam. Patient presented to the ER with acute onset of worsening dyspnea today with associated chest tightness that is diffuse without radiation and associated nonproductive cough with dyspnea he was recently admitted here and had a negative VQ scan.  She denied any leg pain or worsening edema or recent travels or surgeries.  No melena or bright red bleeding per rectum.  No dysuria, oliguria or hematuria or flank pain.She is able to tell me her full name, DOB, location of hospital, and the current calendar year.She denies recent fever, nausea, vomiting, abdominal pain, dysuria, diarrhea, hematuria, blood in her stool, syncope, lost of consciousness. She reports chest pain every time she coughs. She reports no new cough. She is unsure if she has had weight gain or lost. She denies flu like illness. She denies LE edema. PCCM consult placed for further evaluation and management. Patient has Sjogrens disease her CT chest shows inflammatory changes and pulmonary edema. She is asking to go home but I explained this premature and she agrees to stay.   02/11/21- patient with no overnight events. S/p cardiac cath.   02/11/21- met with husband Francee Piccolo at bedside , discussed hospital course.  We discussed pulmonary status , seems more  stable and at this point cardiac issues are priority and patient explains they have reviewed plan with cardiology team for possible surgical intervention on aortic valve in [place].  02/13/21- patient had no overnight events, she has mild dry cough from NPO status , I explained to her she can have something after procedure and should anticipate dc home per primary team. Denies CP, able to walk to bathroom on 2L/min [place] without dyspnea but does endorse physical deconditioning.  Additional workup on outpatient with [facility] pulmonary when patient is in chronic stable state from cardiac perspective.   PAST MEDICAL HISTORY   Past Medical History:  Diagnosis Date  . 1st degree AV block   . ACE-inhibitor cough   . Allergic rhinitis   . Anemia    iron deficiency anemia  . Anxiety   . Aortic ectasia (HCC)    a. CT abd in 12/2016 incidentally noted aortic atherosclerosis and infrarenal abdominal aortic ectasia measuring as large as 2.7 cm with recommendation to repeat US in 2023.  Marland Kitchen Arthritis   . Asthma   . Cataract   . Chronic depression   . Chronic diastolic CHF (congestive heart failure) ([place])   . Chronic headache   . COPD (chronic obstructive pulmonary disease) ([place])   . Coronary artery disease    a. DES to RCA and mid Cx 2009. b. CABG and bioprosthetic AVR May 2015. c. cutting balloon to prox Cx in 05/2016  . Diabetes mellitus  type 2  . Diverticulitis of colon   . Essential hypertension   . GERD (gastroesophageal reflux disease)   . Hearing loss   . History of blood transfusion 2013  . History of prosthetic aortic valve replacement   . HOH (hard of hearing)   . Hypercholesterolemia    intolerance of statins and niaspan  . IDA (iron deficiency anemia) 02/03/2019  . Mobitz type 1 second degree AV block   . OSA (obstructive sleep apnea)    mild, intolerant of cpap  . PAD (peripheral artery disease) ([place])    a. atherosclerosis by CT abd 12/2016 in [place].  Marland Kitchen PONV (postoperative nausea and  vomiting)   . Statin intolerance   . Thyroid disease      SURGICAL HISTORY   Past Surgical History:  Procedure Laterality Date  . ABDOMINAL HYSTERECTOMY    . ABDOMINAL HYSTERECTOMY W/ PARTIAL VAGINACTOMY    . AORTIC VALVE REPLACEMENT N/A 10/12/2013   Procedure: AORTIC VALVE REPLACEMENT (AVR);  Surgeon: [name], MD;  Location: [place];  Service: Open Heart Surgery;  Laterality: N/A;  . [place]  . BARTHOLIN GLAND CYST EXCISION    . BLADDER SUSPENSION    . BREAST BIOPSY Bilateral 09/11/2000   neg  . BREAST BIOPSY Left 07/24/2010   neg  . BREAST CYST EXCISION  1988   bilateral nonmalignant tumors, x3  . CARDIAC CATHETERIZATION    . CARDIAC CATHETERIZATION N/A 05/25/2016   Procedure: Coronary Balloon Angioplasty;  Surgeon: Leonie Man, MD;  Location: [place] [facility];  Service: Cardiovascular;  Laterality: N/A;  . CARDIAC CATHETERIZATION N/A 05/25/2016   Procedure: Coronary/Graft Angiography;  Surgeon: Leonie Man, MD;  Location: [place] [facility];  Service: Cardiovascular;  Laterality: N/A;  . CATARACT EXTRACTION W/ INTRAOCULAR LENS  IMPLANT, BILATERAL    . CHOLECYSTECTOMY  2001  . COLECTOMY     lap sigmoid  . COLONOSCOPY  2014   polyps found, 2 clamped off.  . CORONARY ANGIOPLASTY  10/29/2007   Prox RCA & Mid Cx.  . CORONARY ARTERY BYPASS GRAFT N/A 10/12/2013   Procedure: CORONARY ARTERY BYPASS GRAFT TIMES TWO;  Surgeon: [name], MD;  Location: [place];  Service: Open Heart Surgery;  Laterality: N/A;  . CORONARY/GRAFT ANGIOGRAPHY N/A 09/20/2017   Procedure: CORONARY/GRAFT ANGIOGRAPHY;  Surgeon: Sherren Mocha, MD;  Location: [place] [facility];  Service: Cardiovascular;  Laterality: N/A;  . LEFT HEART CATHETERIZATION WITH CORONARY ANGIOGRAM N/A 10/09/2013   Procedure: LEFT HEART CATHETERIZATION WITH CORONARY ANGIOGRAM;  Surgeon: [name], MD;  [facility] CATH [facility];  Service: Cardiovascular;  Laterality: N/A;  . RIGHT/LEFT HEART CATH AND  CORONARY ANGIOGRAPHY N/A 11/03/2020   Procedure: RIGHT/LEFT HEART CATH AND CORONARY ANGIOGRAPHY;  Surgeon: Corey Skains, MD;  Location: [place] [facility];  Service: Cardiovascular;  Laterality: N/A;  . STERNAL WIRES REMOVAL N/A 04/13/2014   Procedure: STERNAL WIRES REMOVAL;  Surgeon: [name], MD;  Location: [place];  Service: Thoracic;  Laterality: N/A;  . TEE WITHOUT CARDIOVERSION N/A 11/03/2020   Procedure: TRANSESOPHAGEAL ECHOCARDIOGRAM (TEE);  Surgeon: Corey Skains, MD;  Location: [facility];  Service: Cardiovascular;  Laterality: N/A;  . THYROIDECTOMY    . TONSILLECTOMY    . TUBAL LIGATION    . VAGINAL DELIVERY     3  . VISCERAL ARTERY INTERVENTION N/A 08/16/2016   Procedure: Visceral Artery Intervention;  Surgeon: Algernon Huxley, MD;  Location: [place] [facility];  Service: Cardiovascular;  Laterality: N/A;     FAMILY HISTORY   Family History  Problem Relation Age of Onset  . Breast cancer Mother 55  . Hypertension Father   . Mesothelioma Father   . Asthma Father   . Stroke Paternal Grandfather   . Heart disease Other   . Breast cancer Maternal Aunt   . Breast cancer Paternal Aunt      SOCIAL HISTORY   Social History   Tobacco Use  . Smoking status: Former    Packs/day: 0.50    Years: 30.00    Pack years: 15.00    Types: Cigarettes    Quit date: 10/02/2013    Years since quitting: 7.3  . Smokeless tobacco: Never  Vaping Use  . Vaping Use: Never used  Substance Use Topics  . Alcohol use: No  . Drug use: No     MEDICATIONS    Home Medication:    Current Medication:  Current Facility-Administered Medications:  .  0.9 %  sodium chloride infusion, 250 mL, Intravenous, PRN, Andrez Grime, MD .  acetaminophen (TYLENOL) tablet 650 mg, 650 mg, Oral, Q6H PRN, 650 mg at 02/12/21 0932 **OR** acetaminophen (TYLENOL) suppository 650 mg, 650 mg, Rectal, Q6H PRN, Andrez Grime, MD .  ALPRAZolam Duanne Moron) tablet 0.5 mg, 0.5 mg, Oral, TID PRN,  Andrez Grime, MD, 0.5 mg at 02/12/21 1521 .  aspirin EC tablet 81 mg, 81 mg, Oral, Daily, Andrez Grime, MD, 81 mg at 02/12/21 0924 .  clopidogrel (PLAVIX) tablet 75 mg, 75 mg, Oral, Daily, Andrez Grime, MD, 75 mg at 02/12/21 0924 .  DULoxetine (CYMBALTA) DR capsule 20 mg, 20 mg, Oral, q AM, Andrez Grime, MD, 20 mg at 02/12/21 0923 .  fentaNYL (SUBLIMAZE) injection 25 mcg, 25 mcg, Intravenous, Q4H PRN, Andrez Grime, MD .  FLUoxetine (PROZAC) capsule 40 mg, 40 mg, Oral, BID, Andrez Grime, MD, 40 mg at 02/12/21 2306 .  guaiFENesin (MUCINEX) 12 hr tablet 600 mg, 600 mg, Oral, BID, Andrez Grime, MD, 600 mg at 02/12/21 2306 .  insulin aspart (novoLOG) injection 0-20 Units, 0-20 Units, Subcutaneous, TID WC, Andrez Grime, MD, 20 Units at 02/12/21 1314 .  insulin aspart (novoLOG) injection 0-5 Units, 0-5 Units, Subcutaneous, QHS, Andrez Grime, MD, 5 Units at 02/12/21 2305 .  insulin aspart protamine- aspart (NOVOLOG MIX 70/30) injection 75 Units, 75 Units, Subcutaneous, BID WC, Alekh, Kshitiz, MD, 75 Units at 02/12/21 1740 .  ipratropium-albuterol (DUONEB) 0.5-2.5 (3) MG/3ML nebulizer solution 3 mL, 3 mL, Nebulization, Q4H PRN, Andrez Grime, MD .  levothyroxine (SYNTHROID) tablet 125 mcg, 125 mcg, Oral, Q0600, Andrez Grime, MD, 125 mcg at 02/13/21 0527 .  magnesium hydroxide (MILK OF MAGNESIA) suspension 30 mL, 30 mL, Oral, Daily PRN, Andrez Grime, MD .  methylPREDNISolone (MEDROL DOSEPAK) tablet 4 mg, 4 mg, Oral, 4X daily taper, Andrez Grime, MD, 4 mg at 02/12/21 2312 .  [DISCONTINUED] ondansetron (ZOFRAN) tablet 4 mg, 4 mg, Oral, Q6H PRN **OR** ondansetron (ZOFRAN) injection 4 mg, 4 mg, Intravenous, Q6H PRN, Andrez Grime, MD, 4 mg at 02/12/21 0915 .  ondansetron (ZOFRAN) tablet 4 mg, 4 mg, Oral, Q8H PRN, Andrez Grime, MD .  pantoprazole (PROTONIX) EC tablet 40 mg, 40 mg, Oral, Daily, Andrez Grime,  MD, 40 mg at 02/12/21 0924 .  sodium chloride flush (NS) 0.9 % injection 3 mL, 3 mL, Intravenous, Q12H, Corky Sox Kathrin Ruddy, MD, 3  mL at 02/12/21 2308 .  sodium chloride flush (NS) 0.9 % injection 3 mL, 3 mL, Intravenous, Q12H, Andrez Grime, MD, 3 mL at 02/12/21 0925 .  sodium chloride flush (NS) 0.9 % injection 3 mL, 3 mL, Intravenous, PRN, Andrez Grime, MD .  torsemide [facility]) tablet 20 mg, 20 mg, Oral, Daily, Andrez Grime, MD, 20 mg at 02/12/21 0924 .  traZODone (DESYREL) tablet 200 mg, 200 mg, Oral, QHS, Andrez Grime, MD, 200 mg at 02/12/21 2305    ALLERGIES   Amitriptyline, Benadryl [diphenhydramine], Demerol [meperidine], Gabapentin, Meperidine hcl, Mirtazapine, Olanzapine, Voltaren [diclofenac sodium], Zetia [ezetimibe], Ativan [lorazepam], Atorvastatin, Budesonide-formoterol fumarate, Bupropion hcl, Caffeine, Codeine sulfate, Lisinopril, Metformin, Mometasone furoate, Morphine sulfate, Other, Oxycodone-acetaminophen, Pioglitazone, Propoxyphene n-acetaminophen, Rosuvastatin, Shellfish allergy, Suvorexant, Ticagrelor, Tramadol, Trazodone and nefazodone, Venlafaxine, Zolpidem tartrate, and Latex     REVIEW OF SYSTEMS    Review of Systems:  Gen:  Denies  fever, sweats, chills weigh loss  HEENT: Denies blurred vision, double vision, ear pain, eye pain, hearing loss, nose bleeds, sore throat Cardiac:  No dizziness, chest pain or heaviness, chest tightness,edema Resp:   Denies cough or sputum porduction, shortness of breath,wheezing, hemoptysis,  Gi: Denies swallowing difficulty, stomach pain, nausea or vomiting, diarrhea, constipation, bowel incontinence Gu:  Denies bladder incontinence, burning urine Ext:   Denies Joint pain, stiffness or swelling Skin: Denies  skin rash, easy bruising or bleeding or hives Endoc:  Denies polyuria, polydipsia , polyphagia or weight change Psych:   Denies depression, insomnia or hallucinations   Other:  All other systems  negative   VS: BP (!) 117/59 (BP Location: Right Arm)   Pulse 74   Temp 98.1 F (36.7 C)   Resp 19   Ht _0  (1.676 m)   Wt 90.7 kg   SpO2 100%   BMI 32.28 kg/m      PHYSICAL EXAM    GENERAL:NAD, no fevers, chills, no weakness no fatigue HEAD: Normocephalic, atraumatic.  EYES: Pupils equal, round, reactive to light. Extraocular muscles intact. No scleral icterus.  MOUTH: Moist mucosal membrane. Dentition intact. No abscess noted.  EAR, NOSE, THROAT: Clear without exudates. No external lesions.  NECK: Supple. No thyromegaly. No nodules. No JVD.  PULMONARY: Mild crackles bilaterally worse at bases posteriorly CARDIOVASCULAR: S1 and S2. Regular rate and rhythm. No murmurs, rubs, or gallops. No edema. Pedal pulses 2+ bilaterally.  GASTROINTESTINAL: Soft, nontender, nondistended. No masses. Positive bowel sounds. No hepatosplenomegaly.  MUSCULOSKELETAL: No swelling, clubbing, or edema. Range of motion full in all extremities.  NEUROLOGIC: Cranial nerves II through XII are intact. No gross focal neurological deficits. Sensation intact. Reflexes intact.  SKIN: No ulceration, lesions, rashes, or cyanosis. Skin warm and dry. Turgor intact.  PSYCHIATRIC: Mood, affect within normal limits. The patient is awake, alert and oriented x 3. Insight, judgment intact.       IMAGING    DG Chest 2 View  Result Date: 02/07/2021 CLINICAL DATA:  Shortness of breath.  Chest pain. EXAM: CHEST - 2 VIEW COMPARISON:  Most recent radiograph and CT 02/01/2021. FINDINGS: Stable prominent cardiac contours post aortic valve replacement and median sternotomy. Unchanged mediastinal contours. Chronic coarse lung markings. No confluent airspace disease, pleural effusion, or pneumothorax. Ground-glass airspace disease on prior CT are not well seen by radiograph. IMPRESSION: No acute chest finding.  Chronic coarse lung markings. Electronically Signed   By: Keith Rake M.D.   On: 02/07/2021 22:47   CT CHEST  WO CONTRAST  Result  Date: 02/01/2021 CLINICAL DATA:  COPD exacerbation shob and chest pain that started this arm. EXAM: CT CHEST WITHOUT CONTRAST TECHNIQUE: Multidetector CT imaging of the chest was performed following the standard protocol without IV contrast. COMPARISON:  CT angio chest 08/08/2020, CT angiography chest 06/15/2020 FINDINGS: Cardiovascular: Normal heart size. Status post aortic valve replacement. No significant pericardial effusion. The thoracic aorta is normal in caliber. At least moderate atherosclerotic plaque of the thoracic aorta. Four-vessel coronary artery calcifications status post coronary artery stents. The main pulmonary artery is enlarged in caliber measuring up to 3.5 cm. Mediastinum/Nodes: No gross hilar adenopathy, noting limited sensitivity for the detection of hilar adenopathy on this noncontrast study. No enlarged mediastinal or axillary lymph nodes. Nonspecific debris noted within the esophagus. The thyroid and trachea demonstrate no significant findings. Lungs/Pleura: No focal consolidation. Interval decrease in almost resolved patchy ground-glass airspace opacities with a persistent peribronchovascular nodular-like 1 cm right upper lobe lesion (3:54, 5:78). Interval development of a 1.3 cm ground-glass airspace opacity at the right apex (3:17, 5:104). Trace persistent interlobular septal wall thickening. No pulmonary mass. No pleural effusion. No pneumothorax. Upper Abdomen: Status post cholecystectomy.  No acute abnormality. Musculoskeletal: No chest wall abnormality. No suspicious lytic or blastic osseous lesions. No acute displaced fracture. Sternotomy wires are noted. IMPRESSION: 1. Interval development of a 1.3 cm ground-glass airspace opacity at the right apex. Persistent peribronchovascular nodular like 1 cm right upper lobe ground-glass airspace opacity. Adenocarcinoma cannot be excluded. Follow up by CT is recommended in 12 months, with continued annual surveillance  for a minimum of 3 years. These recommendations are taken from: Recommendations for the Management of Subsolid Pulmonary Nodules Detected at CT: A Statement from the [facility] 2013; 266:1, 304-317. 2. Enlarged main pulmonary artery suggestive of hypertension. 3. Debris noted within the esophagus. 4. Aortic Atherosclerosis (ICD10-I70.0) including four-vessel coronary artery calcifications status post stent. 5. Status aortic valve replacement. Electronically Signed   By: Iven Finn M.D.   On: 02/01/2021 17:29   CT Angio Chest PE W and/or Wo Contrast  Result Date: 02/08/2021 CLINICAL DATA:  PE suspected, high prob. Dyspnea. Nonproductive cough. EXAM: CT ANGIOGRAPHY CHEST WITH CONTRAST TECHNIQUE: Multidetector CT imaging of the chest was performed using the standard protocol during bolus administration of intravenous contrast. Multiplanar CT image reconstructions and MIPs were obtained to evaluate the vascular anatomy. CONTRAST:  61mL OMNIPAQUE IOHEXOL 350 MG/ML SOLN COMPARISON:  02/01/2021 FINDINGS: Cardiovascular: There is adequate opacification of the pulmonary arterial tree. No intraluminal filling defect through the segmental level to suggest acute pulmonary embolism. The central pulmonary arteries are enlarged in keeping with changes of pulmonary arterial hypertension. Extensive multi-vessel coronary artery calcification is noted. Probable stenting of the proximal right and left circumflex coronary arteries. Coronary artery bypass grafting and aortic valve replacement has been performed. Mild global cardiomegaly is present. Moderate largely noncalcified atheromatous plaque seen throughout the aorta. No aortic aneurysm. Mediastinum/Nodes: No pathologic thoracic adenopathy. The esophagus is unremarkable. Lungs/Pleura: Imaging is slightly limited by motion artifact. There is bilateral asymmetric patchy ground-glass pulmonary infiltrate more focal within the mid lung zones bilaterally  possibly infectious or inflammatory in nature as can be seen with COVID-19 pneumonia. This has progressed since prior examination. No pneumothorax or pleural effusion. Central airways are widely patent. Upper Abdomen: Cholecystectomy has been performed. No acute abnormality. Musculoskeletal: No acute bone abnormality. No lytic or blastic bone lesion. Review of the MIP images confirms the above findings. IMPRESSION: No pulmonary embolism. Mild cardiomegaly. Morphologic changes  in keeping with pulmonary arterial hypertension. Progressive patchy bilateral mid lung zone ground-glass pulmonary infiltrate, possibly infectious or inflammatory in nature. Serologic correlation for COVID-19 pneumonia may be helpful. Aortic Atherosclerosis (ICD10-I70.0). Electronically Signed   By: Fidela Salisbury M.D.   On: 02/08/2021 02:55   NM Pulmonary Perfusion  Result Date: 01/30/2021 CLINICAL DATA:  Shortness of breath EXAM: NUCLEAR MEDICINE PERFUSION LUNG SCAN TECHNIQUE: Perfusion images were obtained in multiple projections after intravenous injection of radiopharmaceutical. Ventilation scans intentionally deferred if perfusion scan and chest x-ray adequate for interpretation during COVID 19 epidemic. RADIOPHARMACEUTICALS:  4 mCi Tc-71mMAA IV COMPARISON:  01/28/2021 FINDINGS: There is adequate uptake of radioactive tracer throughout both lungs. No focal defect to suggest pulmonary embolism is noted. Cardiac shadow is enlarged. IMPRESSION: No focal defect to suggest pulmonary embolism. Electronically Signed   By: MInez CatalinaM.D.   On: 01/30/2021 16:07   CARDIAC CATHETERIZATION  Result Date: 02/10/2021 .  Mid LAD lesion is 100% stenosed. .Colon FlatteryRCA lesion is 99% stenosed. .Colon FlatteryRCA to Prox RCA lesion is 20% stenosed. .  Prox RCA lesion is 80% stenosed. .  Mid RCA lesion is 100% stenosed. .  Prox Cx to Mid Cx lesion is 50% stenosed. .  Previously placed Origin to Prox Graft stent (unknown type) is  widely patent. .  Previously  placed Dist Graft to Insertion stent (unknown type) is  widely patent. .  Previously placed Prox Cx stent (unknown type) is  widely patent. .  SVG and is large. .Marland Kitchen LIMA and is normal in caliber and large. Patient agitation and frequent movement made the procedure technically difficult. Conclusion: Severe native three-vessel coronary artery disease including CTO of the mid LAD, CTO of the mid RCA, and patent stent in the mid left circumflex. Patent SVG to RCA with 2 patent stents in the proximal and distal segments. Patent LIMA to LAD RA 6, RV 38/5, PA 37/16 (25), PCWP 15 Fick CO equals 3.91, Fick CI 1.93 Recommendation Unable to cross the aortic valve during this procedure.  She needs additional evaluation to determine the severity of her aortic valve stenosis.  Likely we will perform a TEE next week. She has been appropriately diuresed with a RA pressure of 6 and pulmonary capillary wedge pressure 15.  I have ordered her home torsemide of 20 mg daily to be started tomorrow. Continue aspirin and Plavix as previously prescribed for stent placed in June 2022   DG Chest Port 1 View  Result Date: 02/09/2021 CLINICAL DATA:  Bilateral pulmonary infiltrates on CXR EXAM: PORTABLE CHEST - 1 VIEW COMPARISON:  CT from previous day FINDINGS: Coarse nodular airspace opacities throughout both lung fields as before. No pleural effusion. No pneumothorax. Heart size upper limits normal.  Right hilar fullness. Sternotomy wires. Visualized bones unremarkable. IMPRESSION: 1. Persistent coarse nodular bilateral airspace opacities. 2. Right hilar fullness presumably related to regional airspace disease. Electronically Signed   By: DLucrezia EuropeM.D.   On: 02/09/2021 08:00   DG Chest Port 1 View  Result Date: 02/01/2021 CLINICAL DATA:  75year old female with history of shortness of breath. COPD. EXAM: PORTABLE CHEST 1 VIEW COMPARISON:  Chest x-ray 01/30/2021. FINDINGS: Lung volumes are normal. No consolidative airspace disease. No  pleural effusions. Diffuse interstitial prominence and widespread peribronchial cuffing. No pneumothorax. No definite suspicious appearing pulmonary nodules or masses are noted. No evidence of pulmonary edema. Mild cardiomegaly. Upper mediastinal contours are within normal limits. Status post hemi median sternotomy  for aortic valve replacement with what appears to be a stented bioprosthesis. IMPRESSION: 1. Diffuse interstitial prominence and peribronchial cuffing similar to prior studies suggestive of probable chronic bronchitis. 2. Cardiomegaly. Electronically Signed   By: Vinnie Langton M.D.   On: 02/01/2021 10:49   DG Chest Port 1 View  Result Date: 01/30/2021 CLINICAL DATA:  Post VQ scan EXAM: PORTABLE CHEST 1 VIEW COMPARISON:  01/28/2021, 11/24/2020, 03/04/2018 FINDINGS: Post sternotomy changes with valve replacement. Cardiomegaly with mild vascular congestion. Diffuse interstitial opacities some of which is felt due to chronic disease. No pleural effusion or pneumothorax. IMPRESSION: 1. Cardiomegaly with slight central congestion and chronic interstitial opacity. Electronically Signed   By: Donavan Foil M.D.   On: 01/30/2021 16:34   DG Chest Port 1 View  Result Date: 01/28/2021 CLINICAL DATA:  Difficulty breathing.  Shortness of breath. EXAM: PORTABLE CHEST 1 VIEW COMPARISON:  November 24, 2020 FINDINGS: Stable cardiomegaly. The hila and mediastinum are normal. No pneumothorax. No nodules or masses. No focal infiltrates. Increased interstitial opacities in the lungs suggesting mild edema. IMPRESSION: Cardiomegaly and mild pulmonary edema. No other acute abnormalities. Electronically Signed   By: Dorise Bullion III M.D.   On: 01/28/2021 19:17   ECHOCARDIOGRAM COMPLETE  Result Date: 02/02/2021    ECHOCARDIOGRAM REPORT   Patient Name:   CATHLEEN YAGI Date of Exam: 02/02/2021 Medical Rec #:  [id number]     Height:       66.0 in Accession #:    [id number]    Weight:       203.0 lb Date of Birth:  09/20/1945       BSA:          2.012 m Patient Age:    14 years      BP:           144/61 mmHg Patient Gender: F             HR:           74 bpm. Exam Location:  [facility] Procedure: 2D Echo, Color Doppler and Cardiac Doppler Indications:     Chest pain R07.9  History:         Patient has prior history of Echocardiogram examinations, most                  recent 11/25/2020. Prior CABG, COPD; Risk Factors:Hypertension.  Sonographer:     Sherrie Sport Referring Phys:  [id number] SARA-MAIZ A THOMAS Diagnosing Phys: Serafina Royals MD  Sonographer Comments: Technically challenging study due to limited acoustic windows, suboptimal parasternal window and no apical window. Image acquisition challenging due to COPD. IMPRESSIONS  1. Left ventricular ejection fraction, by estimation, is 55 to 60%. The left ventricle has normal function. The left ventricle has no regional wall motion abnormalities. Left ventricular diastolic parameters were normal.  2. Right ventricular systolic function is normal. The right ventricular size is normal.  3. The mitral valve is normal in structure. Mild mitral valve regurgitation. Moderate mitral annular calcification.  4. The aortic valve is normal in structure. Aortic valve regurgitation is not visualized. FINDINGS  Left Ventricle: Left ventricular ejection fraction, by estimation, is 55 to 60%. The left ventricle has normal function. The left ventricle has no regional wall motion abnormalities. The left ventricular internal cavity size was normal in size. There is  no left ventricular hypertrophy. Left ventricular diastolic parameters were normal. Right Ventricle: The right ventricular size is normal. No increase in right ventricular wall thickness. Right  ventricular systolic function is normal. Left Atrium: Left atrial size was normal in size. Right Atrium: Right atrial size was normal in size. Pericardium: There is no evidence of pericardial effusion. Mitral Valve: The mitral valve is normal in structure. Moderate  mitral annular calcification. Mild mitral valve regurgitation. Tricuspid Valve: The tricuspid valve is normal in structure. Tricuspid valve regurgitation is mild. Aortic Valve: The aortic valve is normal in structure. Aortic valve regurgitation is not visualized. Pulmonic Valve: The pulmonic valve was normal in structure. Pulmonic valve regurgitation is not visualized. Aorta: The aortic root and ascending aorta are structurally normal, with no evidence of dilitation. IAS/Shunts: No atrial level shunt detected by color flow Doppler.  LEFT VENTRICLE PLAX 2D LVIDd:         4.40 cm LVIDs:         3.10 cm LV PW:         1.50 cm LV IVS:        1.80 cm LVOT diam:     2.10 cm LVOT Area:     3.46 cm  LEFT ATRIUM         Index LA diam:    4.70 cm 2.34 cm/m                        PULMONIC VALVE AORTA                 PV Vmax:        0.84 m/s Ao Root diam: 3.10 cm PV Peak grad:   2.8 mmHg                       RVOT Peak grad: 5 mmHg   SHUNTS Systemic Diam: 2.10 cm Serafina Royals MD Electronically signed by Serafina Royals MD Signature Date/Time: 02/02/2021/4:41:25 PM    Final            ASSESSMENT/PLAN   Acute on chronic hypoxemic respiratory faiulre  -patient has severe multi vessel CAD s/p CABG - cardiology on case - acutely there is evidence of pulmonary edema.  -she had episode of respiratory distress with recurrent panic attack induced hyperventilation -patient does have moderate pulmonary hypertension on RHC -vital were rechecked during my examination and were stable   Moderate pulmonary hypertension   - sildenafil 20 TID post diuresis  - noted RHC metrics - will discuss this later   Acute pulmonary edema   -improved , patient has TEE scheduled   Severe CAD with Aortic valve dysfunction  TEE scheduled s/p LHC Hx of CABG  Acute exacerbation of COPD with chronic hypoxemia   -  soluemdrol changed to tapering dose pack.  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  Will dc busbar as patient still with sgnificant anxiety, starting xanax low dose tid to help temporarily with anxiety while hospitalized.  Due to recurrent panic attacks she may benefit from proper psychiatric evaluation and medication optimization .  -s/p psychiatric evaluation , everything appeared good no need for any new therapy so this points away from panic attack induced dyspnea   Thank you for allowing me to participate in the care of this patient.   Patient/Family are satisfied with care plan and all questions have been answered.  This document was prepared using Systems analyst and may include  unintentional dictation errors.     Ottie Glazier, M.D.  Division of [facility]

## 2021-02-13 NOTE — Progress Notes (Signed)
*  PRELIMINARY RESULTS* Echocardiogram 2D Echocardiogram has been performed.  [name] 02/13/2021, 10:44 AM

## 2021-02-13 NOTE — Progress Notes (Signed)
PT Cancellation Note  Patient Details Name: [name] MRN: [id number] DOB: 07-02-1945   Cancelled Treatment:    Reason Eval/Treat Not Completed: Patient at procedure or test/unavailable Started PT session and echo tech entered room needing to do stat procedure. Pt undergoing TEE and will re-attempt time permitting later today.   Andrey Campanile 02/13/2021, 10:18 AM

## 2021-02-13 NOTE — Discharge Summary (Signed)
Physician Discharge Summary  [name] [id number] DOB: 06-11-1945 DOA: 02/08/2021  PCP: Rusty Aus, MD  Admit date: 02/08/2021 Discharge date: 02/13/2021  Admitted From: Home Disposition: Home  Recommendations for Outpatient Follow-up:  Follow up with PCP in 1 week with repeat CBC/BMP Outpatient follow-up with cardiology/pulmonary/GI Follow up in ED if symptoms worsen or new appear   Home Health: Patient refused home health PT  equipment/Devices: None  Discharge Condition: Stable CODE STATUS: Full Diet recommendation: Heart healthy  Brief/Interim Summary: 75 year old female with history of asthma/COPD, CAD status post CABG and PCI, diabetes mellitus type 2, GERD, headache, anxiety, recent 2 hospitalizations for COPD exacerbation treated with steroids presented again with worsening shortness of breath.  On presentation, high-sensitivity troponin was 101 113 with BNP of 423.  Stool Hemoccult was positive.  Chest x-ray showed chronic coarse lung markings with no acute chest findings.  She was treated with IV Lasix and Solu-Medrol.  Cardiology/GI/pulmonary were consulted.  GI recommended conservative management and signed off.  Solu-Medrol was switched to tapering Medrol dose pack by pulmonary.  She underwent cardiac catheterization on 02/10/2021 which showed severe native three-vessel CAD with patent grafts, unable to cross the aortic valve.  TEE planned for today on 02/13/2021.  Pulmonary and cardiology have cleared the patient for discharge home after TEE today.  She is adamant to go home today.  She will be discharged home today with outpatient follow-up with PCP/cardiology/pulmonary/GI.  Discharge Diagnoses:   Possible COPD exacerbation Chronic respiratory failure with hypoxia -CTA chest showed no pulmonary embolism but showed progressive patchy bilateral mid lung zone groundglass pulmonary infiltrate, possibly infectious or inflammatory in nature.  COVID-19 test was negative.   Pulmonary following.  IV Solu-Medrol has been switched to tapering Medrol dose pack by pulmonary on 02/09/2021.  Continue remaining doses of Medrol Dosepak on discharge.  Continue home inhaler regimen. -Patient on 3 L oxygen by nasal cannula at home.  Currently on 2 L oxygen. -Respiratory status currently stable.  Outpatient follow-up with pulmonary.   Chest tightness/positive troponin in patient with CAD status post PCI and CABG and AVR -Cardiology following.  High-sensitivity troponins peaked at 1360 on 02/09/2021.  -Status post cardiac catheterization on 02/10/2021 by cardiology which showed severe native three-vessel CAD with patent grafts.  Unable to cross the aortic valve during the procedure -Continue aspirin and Plavix.   -TEE planned for today on 02/13/2021.  Pulmonary and cardiology have cleared the patient for discharge home after TEE today.   Acute pulmonary edema -Improved.  Continue diet and fluid restriction. -Home torsemide has been resumed. -Outpatient follow-up with cardiology.   Hyperkalemia -Resolved.  Potassium supplementation discontinued   Chronic kidney stage IIIb -Creatinine stable.  Monitor   Anemia of chronic disease Fecal occult blood positive in stool -Patient has chronic anemia with only mild drop in hemoglobin.  No evidence of overt GI bleeding.  GI consult appreciated.  No need for inpatient procedures.  GI signed off.  Antiplatelets have been resumed. -Hemoglobin 10.3 today.   Leukocytosis -Outpatient follow-up  Hyponatremia -Mild.  Outpatient follow-up.  Diabetes mellitus type 2 with hyperglycemia -Hyperglycemia aggravated by steroid use.  Continue home regimen.  Carb modified diet.  Outpatient follow-up with PCP.    Depression -Continue Prozac and Cymbalta.  Low-dose Xanax has been started by pulmonary which will be continued.  Psychiatry consultation appreciated: Continue current regimen.  Will need outpatient follow-up with PCP/psychiatry.      Generalized deconditioning -Overall prognosis is guarded to poor.   -  Palliative care consultation appreciated: CODE STATUS has been changed to DNR -PT recommends home health PT.  Patient refusing home health PT.   Hypertension -Blood pressure stable.  Continue torsemide.   Hypothyroidism--continue Synthroid  Discharge Instructions   Allergies as of 02/13/2021       Reactions   Amitriptyline Other (See Comments)   Unknown reaction   Benadryl [diphenhydramine] Shortness Of Breath   Demerol [meperidine] Other (See Comments)   Unknown reaction   Gabapentin Other (See Comments)   Unknown reaction   Meperidine Hcl Other (See Comments)   Unknown reaction   Mirtazapine Other (See Comments)   Unknown reaction   Olanzapine Other (See Comments)   Unknown reaction   Voltaren [diclofenac Sodium] Shortness Of Breath   Zetia [ezetimibe] Other (See Comments)   Weakness in legs, shakiness all over   Ativan [lorazepam] Other (See Comments)   Causes double vision at highter than .5 mg dose   Atorvastatin Other (See Comments)   Muscle aches and weakness   Budesonide-formoterol Fumarate Other (See Comments)   Shakiness, tremors   Bupropion Hcl Other (See Comments)   "cloud over me" depression   Caffeine Other (See Comments)   jitters   Codeine Sulfate Other (See Comments)   Makes chest hurt like a heart attack   Lisinopril Cough   Metformin Nausea And Vomiting   Mometasone Furoate Nausea And Vomiting   Morphine Sulfate Other (See Comments)   Chest pain like a heart attack   Other Other (See Comments)   Beta Blockers, reaction shortness of breath   Oxycodone-acetaminophen Nausea And Vomiting   Pioglitazone Other (See Comments)   Cannot take because of risk of bladder cancer   Propoxyphene N-acetaminophen Nausea And Vomiting   Rosuvastatin Other (See Comments)   Muscle aches and weakness   Shellfish Allergy Diarrhea   Suvorexant Other (See Comments)   Jerking/nervous     Ticagrelor    Other reaction(s): Other (See Comments) "slowed heart rate" & chest pain   Tramadol Nausea Only   Trazodone And Nefazodone Nausea And Vomiting   Venlafaxine Other (See Comments)   Unknown reaction   Zolpidem Tartrate Other (See Comments)    Jittery, diarrhea   Latex Rash        Medication List     STOP taking these medications    insulin glargine 100 UNIT/ML injection Commonly known as: LANTUS   nystatin 100000 UNIT/ML suspension Commonly known as: MYCOSTATIN   potassium chloride 10 MEQ tablet Commonly known as: KLOR-CON       TAKE these medications    acetaminophen 500 MG tablet Commonly known as: TYLENOL Take 500-1,000 mg by mouth every 6 (six) hours as needed (pain).   albuterol (2.5 MG/3ML) 0.083% nebulizer solution Commonly known as: PROVENTIL Take 2.5 mg by nebulization every 4 (four) hours as needed for shortness of breath or wheezing.   Anoro Ellipta 62.5-25 MCG/INH Aepb Generic drug: umeclidinium-vilanterol Inhale 1 puff into the lungs daily.   aspirin 81 MG EC tablet Take 1 tablet (81 mg total) by mouth daily. Swallow whole.   CALTRATE 600 PLUS-VIT D PO Take 1 tablet by mouth 2 (two) times daily.   clopidogrel 75 MG tablet Commonly known as: PLAVIX Take 75 mg by mouth daily.   DULoxetine 20 MG capsule Commonly known as: CYMBALTA Take 1 capsule (20 mg total) by mouth in the morning.   FLUoxetine 40 MG capsule Commonly known as: PROZAC Take 40 mg by mouth 2 (two) times daily.  guaiFENesin 600 MG 12 hr tablet Commonly known as: MUCINEX Take 2 tablets (1,200 mg total) by mouth 2 (two) times daily.   hydrOXYzine 25 MG tablet Commonly known as: ATARAX/VISTARIL Take 1 tablet (25 mg total) by mouth 3 (three) times daily.   insulin lispro 100 UNIT/ML injection Commonly known as: HUMALOG Inject 0-54 Units into the skin 4 (four) times daily -  before meals and at bedtime.   insulin NPH-regular Human (70-30) 100 UNIT/ML  injection Inject 60 Units into the skin 2 (two) times daily with a meal. Inject 60u under the skin every morning at breakfast and inject 60u under the skin every evening with supper   isosorbide mononitrate 30 MG 24 hr tablet Commonly known as: IMDUR Take 1 tablet (30 mg total) by mouth daily.   levothyroxine 125 MCG tablet Commonly known as: SYNTHROID Take 125 mcg by mouth daily before breakfast.   losartan 25 MG tablet Commonly known as: COZAAR Take 1 tablet (25 mg total) by mouth in the morning.   methylPREDNISolone 4 MG Tbpk tablet Commonly known as: MEDROL DOSEPAK Take one tablet (4 mg) at bedtime and take one tablet in the morning.   pantoprazole 40 MG tablet Commonly known as: PROTONIX Take 40 mg by mouth in the morning.   promethazine 25 MG tablet Commonly known as: PHENERGAN Take 25 mg by mouth every 6 (six) hours as needed for nausea or vomiting.   torsemide 20 MG tablet Commonly known as: DEMADEX Take 20 mg by mouth daily.   traZODone 100 MG tablet Commonly known as: DESYREL Take 200 mg by mouth at bedtime.         Follow-up Information     Rusty Aus, MD. Schedule an appointment as soon as possible for a visit in 1 week(s).   Specialty: Internal Medicine Why: with repeat cbc/bmp Contact information: [place] [address] [phone]         [name], MD .   Specialty: Cardiology Contact information: [place] [facility] [address] [place] [address] [phone]         Corey Skains, MD. Schedule an appointment as soon as possible for a visit in 1 week(s).   Specialty: Cardiology Contact information: [facility] [place] [address] [phone]         Ottie Glazier, MD. Schedule an appointment as soon as possible for a visit in 1 week(s).   Specialty: Pulmonary Disease Contact information: [place] [address] [phone]         Efrain Sella, MD. Schedule an appointment as soon as possible for a visit in 1 week(s).   Specialty: Gastroenterology Contact information: [place] [address] [phone]                Allergies  Allergen Reactions   Amitriptyline Other (See Comments)    Unknown reaction   Benadryl [Diphenhydramine] Shortness Of Breath   Demerol [Meperidine] Other (See Comments)    Unknown reaction   Gabapentin Other (See Comments)    Unknown reaction   Meperidine Hcl Other (See Comments)    Unknown reaction   Mirtazapine Other (See Comments)    Unknown reaction   Olanzapine Other (See Comments)    Unknown reaction    Voltaren [Diclofenac Sodium] Shortness Of Breath   Zetia [Ezetimibe] Other (See Comments)    Weakness in legs, shakiness all over  Ativan [Lorazepam] Other (See Comments)    Causes double vision at highter than .5 mg dose   Atorvastatin Other (See Comments)    Muscle aches and weakness   Budesonide-Formoterol Fumarate Other (See Comments)    Shakiness, tremors   Bupropion Hcl Other (See Comments)    "cloud over me" depression   Caffeine Other (See Comments)    jitters   Codeine Sulfate Other (See Comments)    Makes chest hurt like a heart attack   Lisinopril Cough   Metformin Nausea And Vomiting   Mometasone Furoate Nausea And Vomiting   Morphine Sulfate Other (See Comments)    Chest pain like a heart attack   Other Other (See Comments)    Beta Blockers, reaction shortness of breath   Oxycodone-Acetaminophen Nausea And Vomiting   Pioglitazone Other (See Comments)    Cannot take because of risk of bladder cancer   Propoxyphene N-Acetaminophen Nausea And Vomiting   Rosuvastatin Other (See Comments)    Muscle aches and weakness   Shellfish Allergy Diarrhea   Suvorexant Other (See Comments)    Jerking/nervous    Ticagrelor     Other reaction(s): Other (See  Comments) "slowed heart rate" & chest pain   Tramadol Nausea Only   Trazodone And Nefazodone Nausea And Vomiting   Venlafaxine Other (See Comments)    Unknown reaction   Zolpidem Tartrate Other (See Comments)     Jittery, diarrhea   Latex Rash    Consultations: GI/cardiology/pulmonary/psychiatry   Procedures/Studies: DG Chest 2 View  Result Date: 02/07/2021 CLINICAL DATA:  Shortness of breath.  Chest pain. EXAM: CHEST - 2 VIEW COMPARISON:  Most recent radiograph and CT 02/01/2021. FINDINGS: Stable prominent cardiac contours post aortic valve replacement and median sternotomy. Unchanged mediastinal contours. Chronic coarse lung markings. No confluent airspace disease, pleural effusion, or pneumothorax. Ground-glass airspace disease on prior CT are not well seen by radiograph. IMPRESSION: No acute chest finding.  Chronic coarse lung markings. Electronically Signed   By: Keith Rake M.D.   On: 02/07/2021 22:47   CT CHEST WO CONTRAST  Result Date: 02/01/2021 CLINICAL DATA:  COPD exacerbation shob and chest pain that started this arm. EXAM: CT CHEST WITHOUT CONTRAST TECHNIQUE: Multidetector CT imaging of the chest was performed following the standard protocol without IV contrast. COMPARISON:  CT angio chest 08/08/2020, CT angiography chest 06/15/2020 FINDINGS: Cardiovascular: Normal heart size. Status post aortic valve replacement. No significant pericardial effusion. The thoracic aorta is normal in caliber. At least moderate atherosclerotic plaque of the thoracic aorta. Four-vessel coronary artery calcifications status post coronary artery stents. The main pulmonary artery is enlarged in caliber measuring up to 3.5 cm. Mediastinum/Nodes: No gross hilar adenopathy, noting limited sensitivity for the detection of hilar adenopathy on this noncontrast study. No enlarged mediastinal or axillary lymph nodes. Nonspecific debris noted within the esophagus. The thyroid and trachea demonstrate no  significant findings. Lungs/Pleura: No focal consolidation. Interval decrease in almost resolved patchy ground-glass airspace opacities with a persistent peribronchovascular nodular-like 1 cm right upper lobe lesion (3:54, 5:78). Interval development of a 1.3 cm ground-glass airspace opacity at the right apex (3:17, 5:104). Trace persistent interlobular septal wall thickening. No pulmonary mass. No pleural effusion. No pneumothorax. Upper Abdomen: Status post cholecystectomy.  No acute abnormality. Musculoskeletal: No chest wall abnormality. No suspicious lytic or blastic osseous lesions. No acute displaced fracture. Sternotomy wires are noted. IMPRESSION: 1. Interval development of a 1.3 cm ground-glass airspace opacity at the right apex. Persistent peribronchovascular nodular like  1 cm right upper lobe ground-glass airspace opacity. Adenocarcinoma cannot be excluded. Follow up by CT is recommended in 12 months, with continued annual surveillance for a minimum of 3 years. These recommendations are taken from: Recommendations for the Management of Subsolid Pulmonary Nodules Detected at CT: A Statement from the [facility] 2013; 266:1, 304-317. 2. Enlarged main pulmonary artery suggestive of hypertension. 3. Debris noted within the esophagus. 4. Aortic Atherosclerosis (ICD10-I70.0) including four-vessel coronary artery calcifications status post stent. 5. Status aortic valve replacement. Electronically Signed   By: Iven Finn M.D.   On: 02/01/2021 17:29   CT Angio Chest PE W and/or Wo Contrast  Result Date: 02/08/2021 CLINICAL DATA:  PE suspected, high prob. Dyspnea. Nonproductive cough. EXAM: CT ANGIOGRAPHY CHEST WITH CONTRAST TECHNIQUE: Multidetector CT imaging of the chest was performed using the standard protocol during bolus administration of intravenous contrast. Multiplanar CT image reconstructions and MIPs were obtained to evaluate the vascular anatomy. CONTRAST:  57m OMNIPAQUE  IOHEXOL 350 MG/ML SOLN COMPARISON:  02/01/2021 FINDINGS: Cardiovascular: There is adequate opacification of the pulmonary arterial tree. No intraluminal filling defect through the segmental level to suggest acute pulmonary embolism. The central pulmonary arteries are enlarged in keeping with changes of pulmonary arterial hypertension. Extensive multi-vessel coronary artery calcification is noted. Probable stenting of the proximal right and left circumflex coronary arteries. Coronary artery bypass grafting and aortic valve replacement has been performed. Mild global cardiomegaly is present. Moderate largely noncalcified atheromatous plaque seen throughout the aorta. No aortic aneurysm. Mediastinum/Nodes: No pathologic thoracic adenopathy. The esophagus is unremarkable. Lungs/Pleura: Imaging is slightly limited by motion artifact. There is bilateral asymmetric patchy ground-glass pulmonary infiltrate more focal within the mid lung zones bilaterally possibly infectious or inflammatory in nature as can be seen with COVID-19 pneumonia. This has progressed since prior examination. No pneumothorax or pleural effusion. Central airways are widely patent. Upper Abdomen: Cholecystectomy has been performed. No acute abnormality. Musculoskeletal: No acute bone abnormality. No lytic or blastic bone lesion. Review of the MIP images confirms the above findings. IMPRESSION: No pulmonary embolism. Mild cardiomegaly. Morphologic changes in keeping with pulmonary arterial hypertension. Progressive patchy bilateral mid lung zone ground-glass pulmonary infiltrate, possibly infectious or inflammatory in nature. Serologic correlation for COVID-19 pneumonia may be helpful. Aortic Atherosclerosis (ICD10-I70.0). Electronically Signed   By: AFidela SalisburyM.D.   On: 02/08/2021 02:55   NM Pulmonary Perfusion  Result Date: 01/30/2021 CLINICAL DATA:  Shortness of breath EXAM: NUCLEAR MEDICINE PERFUSION LUNG SCAN TECHNIQUE: Perfusion images  were obtained in multiple projections after intravenous injection of radiopharmaceutical. Ventilation scans intentionally deferred if perfusion scan and chest x-ray adequate for interpretation during COVID 19 epidemic. RADIOPHARMACEUTICALS:  4 mCi Tc-974mAA IV COMPARISON:  01/28/2021 FINDINGS: There is adequate uptake of radioactive tracer throughout both lungs. No focal defect to suggest pulmonary embolism is noted. Cardiac shadow is enlarged. IMPRESSION: No focal defect to suggest pulmonary embolism. Electronically Signed   By: MaInez Catalina.D.   On: 01/30/2021 16:07   CARDIAC CATHETERIZATION  Result Date: 02/10/2021   Mid LAD lesion is 100% stenosed.   Ost RCA lesion is 99% stenosed.   Ost RCA to Prox RCA lesion is 20% stenosed.   Prox RCA lesion is 80% stenosed.   Mid RCA lesion is 100% stenosed.   Prox Cx to Mid Cx lesion is 50% stenosed.   Previously placed Origin to Prox Graft stent (unknown type) is  widely patent.   Previously placed Dist Graft to Insertion stent (unknown  type) is  widely patent.   Previously placed Prox Cx stent (unknown type) is  widely patent.   SVG and is large.   LIMA and is normal in caliber and large. Patient agitation and frequent movement made the procedure technically difficult. Conclusion: Severe native three-vessel coronary artery disease including CTO of the mid LAD, CTO of the mid RCA, and patent stent in the mid left circumflex. Patent SVG to RCA with 2 patent stents in the proximal and distal segments. Patent LIMA to LAD RA 6, RV 38/5, PA 37/16 (25), PCWP 15 Fick CO equals 3.91, Fick CI 1.93 Recommendation Unable to cross the aortic valve during this procedure.  She needs additional evaluation to determine the severity of her aortic valve stenosis.  Likely we will perform a TEE next week. She has been appropriately diuresed with a RA pressure of 6 and pulmonary capillary wedge pressure 15.  I have ordered her home torsemide of 20 mg daily to be started tomorrow.  Continue aspirin and Plavix as previously prescribed for stent placed in June 2022   DG Chest Port 1 View  Result Date: 02/09/2021 CLINICAL DATA:  Bilateral pulmonary infiltrates on CXR EXAM: PORTABLE CHEST - 1 VIEW COMPARISON:  CT from previous day FINDINGS: Coarse nodular airspace opacities throughout both lung fields as before. No pleural effusion. No pneumothorax. Heart size upper limits normal.  Right hilar fullness. Sternotomy wires. Visualized bones unremarkable. IMPRESSION: 1. Persistent coarse nodular bilateral airspace opacities. 2. Right hilar fullness presumably related to regional airspace disease. Electronically Signed   By: Lucrezia Europe M.D.   On: 02/09/2021 08:00   DG Chest Port 1 View  Result Date: 02/01/2021 CLINICAL DATA:  75 year old female with history of shortness of breath. COPD. EXAM: PORTABLE CHEST 1 VIEW COMPARISON:  Chest x-ray 01/30/2021. FINDINGS: Lung volumes are normal. No consolidative airspace disease. No pleural effusions. Diffuse interstitial prominence and widespread peribronchial cuffing. No pneumothorax. No definite suspicious appearing pulmonary nodules or masses are noted. No evidence of pulmonary edema. Mild cardiomegaly. Upper mediastinal contours are within normal limits. Status post hemi median sternotomy for aortic valve replacement with what appears to be a stented bioprosthesis. IMPRESSION: 1. Diffuse interstitial prominence and peribronchial cuffing similar to prior studies suggestive of probable chronic bronchitis. 2. Cardiomegaly. Electronically Signed   By: Vinnie Langton M.D.   On: 02/01/2021 10:49   DG Chest Port 1 View  Result Date: 01/30/2021 CLINICAL DATA:  Post VQ scan EXAM: PORTABLE CHEST 1 VIEW COMPARISON:  01/28/2021, 11/24/2020, 03/04/2018 FINDINGS: Post sternotomy changes with valve replacement. Cardiomegaly with mild vascular congestion. Diffuse interstitial opacities some of which is felt due to chronic disease. No pleural effusion or  pneumothorax. IMPRESSION: 1. Cardiomegaly with slight central congestion and chronic interstitial opacity. Electronically Signed   By: Donavan Foil M.D.   On: 01/30/2021 16:34   DG Chest Port 1 View  Result Date: 01/28/2021 CLINICAL DATA:  Difficulty breathing.  Shortness of breath. EXAM: PORTABLE CHEST 1 VIEW COMPARISON:  November 24, 2020 FINDINGS: Stable cardiomegaly. The hila and mediastinum are normal. No pneumothorax. No nodules or masses. No focal infiltrates. Increased interstitial opacities in the lungs suggesting mild edema. IMPRESSION: Cardiomegaly and mild pulmonary edema. No other acute abnormalities. Electronically Signed   By: Dorise Bullion III M.D.   On: 01/28/2021 19:17   ECHOCARDIOGRAM COMPLETE  Result Date: 02/02/2021    ECHOCARDIOGRAM REPORT   Patient Name:   [name] Date of Exam: 02/02/2021 Medical Rec #:  [id number]  Height:       66.0 in Accession #:    [id number]    Weight:       203.0 lb Date of Birth:  01-03-1946      BSA:          2.012 m Patient Age:    75 years      BP:           144/61 mmHg Patient Gender: F             HR:           74 bpm. Exam Location:  [facility] Procedure: 2D Echo, Color Doppler and Cardiac Doppler Indications:     Chest pain R07.9  History:         Patient has prior history of Echocardiogram examinations, most                  recent 11/25/2020. Prior CABG, COPD; Risk Factors:Hypertension.  Sonographer:     Sherrie Sport Referring Phys:  [id number] SARA-MAIZ A THOMAS Diagnosing Phys: Serafina Royals MD  Sonographer Comments: Technically challenging study due to limited acoustic windows, suboptimal parasternal window and no apical window. Image acquisition challenging due to COPD. IMPRESSIONS  1. Left ventricular ejection fraction, by estimation, is 55 to 60%. The left ventricle has normal function. The left ventricle has no regional wall motion abnormalities. Left ventricular diastolic parameters were normal.  2. Right ventricular systolic function is normal. The  right ventricular size is normal.  3. The mitral valve is normal in structure. Mild mitral valve regurgitation. Moderate mitral annular calcification.  4. The aortic valve is normal in structure. Aortic valve regurgitation is not visualized. FINDINGS  Left Ventricle: Left ventricular ejection fraction, by estimation, is 55 to 60%. The left ventricle has normal function. The left ventricle has no regional wall motion abnormalities. The left ventricular internal cavity size was normal in size. There is  no left ventricular hypertrophy. Left ventricular diastolic parameters were normal. Right Ventricle: The right ventricular size is normal. No increase in right ventricular wall thickness. Right ventricular systolic function is normal. Left Atrium: Left atrial size was normal in size. Right Atrium: Right atrial size was normal in size. Pericardium: There is no evidence of pericardial effusion. Mitral Valve: The mitral valve is normal in structure. Moderate mitral annular calcification. Mild mitral valve regurgitation. Tricuspid Valve: The tricuspid valve is normal in structure. Tricuspid valve regurgitation is mild. Aortic Valve: The aortic valve is normal in structure. Aortic valve regurgitation is not visualized. Pulmonic Valve: The pulmonic valve was normal in structure. Pulmonic valve regurgitation is not visualized. Aorta: The aortic root and ascending aorta are structurally normal, with no evidence of dilitation. IAS/Shunts: No atrial level shunt detected by color flow Doppler.  LEFT VENTRICLE PLAX 2D LVIDd:         4.40 cm LVIDs:         3.10 cm LV PW:         1.50 cm LV IVS:        1.80 cm LVOT diam:     2.10 cm LVOT Area:     3.46 cm  LEFT ATRIUM         Index LA diam:    4.70 cm 2.34 cm/m                        PULMONIC VALVE AORTA  PV Vmax:        0.84 m/s Ao Root diam: 3.10 cm PV Peak grad:   2.8 mmHg                       RVOT Peak grad: 5 mmHg   SHUNTS Systemic Diam: 2.10 cm Serafina Royals  MD Electronically signed by Serafina Royals MD Signature Date/Time: 02/02/2021/4:41:25 PM    Final       Subjective: Patient seen and examined at bedside.  Adamant about going home today.  Denies any chest pain, worsening shortness of breath, fever or vomiting.  Discharge Exam: Vitals:   02/13/21 0804 02/13/21 1047  BP: (!) 117/59 133/65  Pulse: 74 83  Resp: 19 16  Temp:  99.5 F (37.5 C)  SpO2: 100% 100%    General: Pt is alert, awake, not in acute distress.  Currently on 2 L oxygen via nasal cannula.  Looks chronically ill and deconditioned. Cardiovascular: rate controlled, S1/S2 + Respiratory: bilateral decreased breath sounds at bases with some scattered crackles Abdominal: Soft, NT, ND, bowel sounds + Extremities: Bilateral lower extremity edema present; no cyanosis    The results of significant diagnostics from this hospitalization (including imaging, microbiology, ancillary and laboratory) are listed below for reference.     Microbiology: Recent Results (from the past 240 hour(s))  Resp Panel by RT-PCR (Flu A&B, Covid) Nasopharyngeal Swab     Status: None   Collection Time: 02/08/21  2:00 AM   Specimen: Nasopharyngeal Swab; Nasopharyngeal(NP) swabs in vial transport medium  Result Value Ref Range Status   SARS Coronavirus 2 by RT PCR NEGATIVE NEGATIVE Final    Comment: (NOTE) SARS-CoV-2 target nucleic acids are NOT DETECTED.  The SARS-CoV-2 RNA is generally detectable in upper respiratory specimens during the acute phase of infection. The lowest concentration of SARS-CoV-2 viral copies this assay can detect is 138 copies/mL. A negative result does not preclude SARS-Cov-2 infection and should not be used as the sole basis for treatment or other patient management decisions. A negative result may occur with  improper specimen collection/handling, submission of specimen other than nasopharyngeal swab, presence of viral mutation(s) within the areas targeted by this  assay, and inadequate number of viral copies(<138 copies/mL). A negative result must be combined with clinical observations, patient history, and epidemiological information. The expected result is Negative.  Fact Sheet for Patients:  EntrepreneurPulse.com.au  Fact Sheet for Healthcare Providers:  IncredibleEmployment.be  This test is no t yet approved or cleared by the [place] FDA and  has been authorized for detection and/or diagnosis of SARS-CoV-2 by FDA under an Emergency Use Authorization (EUA). This EUA will remain  in effect (meaning this test can be used) for the duration of the COVID-19 declaration under Section 564(b)(1) of the Act, 21 U.S.C.section 360bbb-3(b)(1), unless the authorization is terminated  or revoked sooner.       Influenza A by PCR NEGATIVE NEGATIVE Final   Influenza B by PCR NEGATIVE NEGATIVE Final    Comment: (NOTE) The Xpert Xpress SARS-CoV-2/FLU/RSV plus assay is intended as an aid in the diagnosis of influenza from Nasopharyngeal swab specimens and should not be used as a sole basis for treatment. Nasal washings and aspirates are unacceptable for Xpert Xpress SARS-CoV-2/FLU/RSV testing.  Fact Sheet for Patients: EntrepreneurPulse.com.au  Fact Sheet for Healthcare Providers: IncredibleEmployment.be  This test is not yet approved or cleared by the [place] FDA and has been authorized for detection and/or diagnosis of SARS-CoV-2 by  FDA under an Emergency Use Authorization (EUA). This EUA will remain in effect (meaning this test can be used) for the duration of the COVID-19 declaration under Section 564(b)(1) of the Act, 21 U.S.C. section 360bbb-3(b)(1), unless the authorization is terminated or revoked.  Performed at [facility], [address] [place], [place] [address]      Labs: BNP (last 3 results) Recent Labs    01/28/21 1959 02/01/21 1005  02/07/21 2306  BNP 268.6* 103.0* [id number]   Basic Metabolic Panel: Recent Labs  Lab 02/09/21 0427 02/10/21 0731 02/11/21 0553 02/12/21 0420 02/13/21 0423  NA 130* 127* 133* 134* 134*  K 4.5 5.0 5.5* 4.9 4.6  CL 93* 88* 93* 93* 95*  CO2 28 31 34* 32 33*  GLUCOSE 381* 396* 257* 294* 241*  BUN 31* 34* 35* 34* 30*  CREATININE 1.27* 1.14* 1.28* 1.24* 1.09*  CALCIUM 7.9* 8.4* 8.2* 7.8* 7.9*  MG 1.8 2.1 2.2 2.1 2.1   Liver Function Tests: No results for input(s): AST, ALT, ALKPHOS, BILITOT, PROT, ALBUMIN in the last 168 hours. No results for input(s): LIPASE, AMYLASE in the last 168 hours. No results for input(s): AMMONIA in the last 168 hours. CBC: Recent Labs  Lab 02/09/21 0427 02/10/21 0731 02/11/21 0553 02/12/21 0420 02/13/21 0423  WBC 14.4* 16.3* 14.3* 15.6* 16.0*  NEUTROABS 13.1* 13.7* 12.0* 12.5* 12.8*  HGB 9.0* 9.8* 11.2* 10.3* 10.3*  HCT 26.3* 29.0* 32.9* 31.1* 31.3*  MCV 84.8 84.5 86.8 86.6 85.8  PLT 196 211 241 222 224   Cardiac Enzymes: No results for input(s): CKTOTAL, CKMB, CKMBINDEX, TROPONINI in the last 168 hours. BNP: Invalid input(s): POCBNP CBG: Recent Labs  Lab 02/12/21 1149 02/12/21 1650 02/12/21 2017 02/13/21 0846 02/13/21 1050  GLUCAP 380* 474* 352* 191* 157*   D-Dimer No results for input(s): DDIMER in the last 72 hours. Hgb A1c No results for input(s): HGBA1C in the last 72 hours. Lipid Profile No results for input(s): CHOL, HDL, LDLCALC, TRIG, CHOLHDL, LDLDIRECT in the last 72 hours. Thyroid function studies No results for input(s): TSH, T4TOTAL, T3FREE, THYROIDAB in the last 72 hours.  Invalid input(s): FREET3 Anemia work up No results for input(s): VITAMINB12, FOLATE, FERRITIN, TIBC, IRON, RETICCTPCT in the last 72 hours. Urinalysis    Component Value Date/Time   COLORURINE STRAW (A) 11/25/2020 0420   APPEARANCEUR CLEAR (A) 11/25/2020 0420   APPEARANCEUR Clear 11/25/2012 1335   LABSPEC 1.003 (L) 11/25/2020 0420   LABSPEC  1.017 11/25/2012 1335   PHURINE 6.0 11/25/2020 0420   GLUCOSEU 50 (A) 11/25/2020 0420   GLUCOSEU Negative 11/25/2012 1335   HGBUR SMALL (A) 11/25/2020 0420   HGBUR negative 11/11/2009 0854   BILIRUBINUR NEGATIVE 11/25/2020 0420   BILIRUBINUR Negative 11/25/2012 1335   KETONESUR NEGATIVE 11/25/2020 0420   PROTEINUR NEGATIVE 11/25/2020 0420   UROBILINOGEN 0.2 11/11/2009 0854   NITRITE NEGATIVE 11/25/2020 0420   LEUKOCYTESUR NEGATIVE 11/25/2020 0420   LEUKOCYTESUR Negative 11/25/2012 1335   Sepsis Labs Invalid input(s): PROCALCITONIN,  WBC,  LACTICIDVEN Microbiology Recent Results (from the past 240 hour(s))  Resp Panel by RT-PCR (Flu A&B, Covid) Nasopharyngeal Swab     Status: None   Collection Time: 02/08/21  2:00 AM   Specimen: Nasopharyngeal Swab; Nasopharyngeal(NP) swabs in vial transport medium  Result Value Ref Range Status   SARS Coronavirus 2 by RT PCR NEGATIVE NEGATIVE Final    Comment: (NOTE) SARS-CoV-2 target nucleic acids are NOT DETECTED.  The SARS-CoV-2 RNA is generally detectable in upper respiratory  specimens during the acute phase of infection. The lowest concentration of SARS-CoV-2 viral copies this assay can detect is 138 copies/mL. A negative result does not preclude SARS-Cov-2 infection and should not be used as the sole basis for treatment or other patient management decisions. A negative result may occur with  improper specimen collection/handling, submission of specimen other than nasopharyngeal swab, presence of viral mutation(s) within the areas targeted by this assay, and inadequate number of viral copies(<138 copies/mL). A negative result must be combined with clinical observations, patient history, and epidemiological information. The expected result is Negative.  Fact Sheet for Patients:  EntrepreneurPulse.com.au  Fact Sheet for Healthcare Providers:  IncredibleEmployment.be  This test is no t yet approved or  cleared by the [place] FDA and  has been authorized for detection and/or diagnosis of SARS-CoV-2 by FDA under an Emergency Use Authorization (EUA). This EUA will remain  in effect (meaning this test can be used) for the duration of the COVID-19 declaration under Section 564(b)(1) of the Act, 21 U.S.C.section 360bbb-3(b)(1), unless the authorization is terminated  or revoked sooner.       Influenza A by PCR NEGATIVE NEGATIVE Final   Influenza B by PCR NEGATIVE NEGATIVE Final    Comment: (NOTE) The Xpert Xpress SARS-CoV-2/FLU/RSV plus assay is intended as an aid in the diagnosis of influenza from Nasopharyngeal swab specimens and should not be used as a sole basis for treatment. Nasal washings and aspirates are unacceptable for Xpert Xpress SARS-CoV-2/FLU/RSV testing.  Fact Sheet for Patients: EntrepreneurPulse.com.au  Fact Sheet for Healthcare Providers: IncredibleEmployment.be  This test is not yet approved or cleared by the [place] FDA and has been authorized for detection and/or diagnosis of SARS-CoV-2 by FDA under an Emergency Use Authorization (EUA). This EUA will remain in effect (meaning this test can be used) for the duration of the COVID-19 declaration under Section 564(b)(1) of the Act, 21 U.S.C. section 360bbb-3(b)(1), unless the authorization is terminated or revoked.  Performed at [facility], [address], [place],  [address]      Time coordinating discharge: 35 minutes  SIGNED:   Aline August, MD  [facility] 02/13/2021, 11:41 AM

## 2021-02-13 NOTE — Care Management Important Message (Signed)
Important Message  Patient Details  Name: [name] MRN: [id number] Date of Birth: [birth year]   Medicare Important Message Given:  Yes     Dannette Barbara 02/13/2021, 1:06 PM

## 2021-02-13 NOTE — Progress Notes (Signed)
Discharge paperwork discussed and reviewed with pt and husband at bedside. Paperwork given to pt. All questions answered at this time.

## 2021-02-14 ENCOUNTER — Encounter: Payer: Self-pay | Admitting: Internal Medicine

## 2021-02-16 LAB — GLUCOSE, CAPILLARY: Glucose-Capillary: 540 mg/dL (ref 70–99)

## 2021-02-23 ENCOUNTER — Other Ambulatory Visit: Payer: Self-pay

## 2021-02-23 ENCOUNTER — Emergency Department (HOSPITAL_COMMUNITY)
Admission: EM | Admit: 2021-02-23 | Discharge: 2021-02-23 | Disposition: A | Discharge status: Home / Self Care | Payer: Medicare HMO | Attending: Emergency Medicine | Admitting: Emergency Medicine

## 2021-02-23 ENCOUNTER — Encounter (HOSPITAL_COMMUNITY): Payer: Self-pay | Admitting: Emergency Medicine

## 2021-02-23 ENCOUNTER — Telehealth: Payer: Self-pay | Admitting: Cardiovascular Disease

## 2021-02-23 ENCOUNTER — Emergency Department (HOSPITAL_COMMUNITY): Payer: Medicare HMO

## 2021-02-23 DIAGNOSIS — Z87891 Personal history of nicotine dependence: Secondary | ICD-10-CM | POA: Diagnosis not present

## 2021-02-23 DIAGNOSIS — Z7982 Long term (current) use of aspirin: Secondary | ICD-10-CM | POA: Insufficient documentation

## 2021-02-23 DIAGNOSIS — Z91040 Latex allergy status: Secondary | ICD-10-CM | POA: Insufficient documentation

## 2021-02-23 DIAGNOSIS — R0602 Shortness of breath: Secondary | ICD-10-CM | POA: Insufficient documentation

## 2021-02-23 DIAGNOSIS — J449 Chronic obstructive pulmonary disease, unspecified: Secondary | ICD-10-CM | POA: Diagnosis not present

## 2021-02-23 DIAGNOSIS — D631 Anemia in chronic kidney disease: Secondary | ICD-10-CM | POA: Insufficient documentation

## 2021-02-23 DIAGNOSIS — Z7902 Long term (current) use of antithrombotics/antiplatelets: Secondary | ICD-10-CM | POA: Diagnosis not present

## 2021-02-23 DIAGNOSIS — I130 Hypertensive heart and chronic kidney disease with heart failure and stage 1 through stage 4 chronic kidney disease, or unspecified chronic kidney disease: Secondary | ICD-10-CM | POA: Diagnosis not present

## 2021-02-23 DIAGNOSIS — J9601 Acute respiratory failure with hypoxia: Secondary | ICD-10-CM | POA: Diagnosis not present

## 2021-02-23 DIAGNOSIS — I2510 Atherosclerotic heart disease of native coronary artery without angina pectoris: Secondary | ICD-10-CM | POA: Diagnosis not present

## 2021-02-23 DIAGNOSIS — N1831 Chronic kidney disease, stage 3a: Secondary | ICD-10-CM | POA: Diagnosis not present

## 2021-02-23 DIAGNOSIS — F606 Avoidant personality disorder: Secondary | ICD-10-CM | POA: Insufficient documentation

## 2021-02-23 DIAGNOSIS — Z09 Encounter for follow-up examination after completed treatment for conditions other than malignant neoplasm: Secondary | ICD-10-CM

## 2021-02-23 DIAGNOSIS — I350 Nonrheumatic aortic (valve) stenosis: Secondary | ICD-10-CM | POA: Diagnosis not present

## 2021-02-23 DIAGNOSIS — Z794 Long term (current) use of insulin: Secondary | ICD-10-CM | POA: Insufficient documentation

## 2021-02-23 DIAGNOSIS — J45909 Unspecified asthma, uncomplicated: Secondary | ICD-10-CM | POA: Diagnosis not present

## 2021-02-23 DIAGNOSIS — E1122 Type 2 diabetes mellitus with diabetic chronic kidney disease: Secondary | ICD-10-CM | POA: Diagnosis not present

## 2021-02-23 DIAGNOSIS — E039 Hypothyroidism, unspecified: Secondary | ICD-10-CM | POA: Diagnosis not present

## 2021-02-23 DIAGNOSIS — I5033 Acute on chronic diastolic (congestive) heart failure: Secondary | ICD-10-CM | POA: Insufficient documentation

## 2021-02-23 DIAGNOSIS — Z951 Presence of aortocoronary bypass graft: Secondary | ICD-10-CM | POA: Diagnosis not present

## 2021-02-23 LAB — I-STAT VENOUS BLOOD GAS, ED
Acid-Base Excess: 4 mmol/L — ABNORMAL HIGH (ref 0.0–2.0)
Bicarbonate: 26.7 mmol/L (ref 20.0–28.0)
Calcium, Ion: 0.98 mmol/L — ABNORMAL LOW (ref 1.15–1.40)
HCT: 27 % — ABNORMAL LOW (ref 36.0–46.0)
Hemoglobin: 9.2 g/dL — ABNORMAL LOW (ref 12.0–15.0)
O2 Saturation: 76 %
Potassium: 3.8 mmol/L (ref 3.5–5.1)
Sodium: 137 mmol/L (ref 135–145)
TCO2: 28 mmol/L (ref 22–32)
pCO2, Ven: 31.3 mmHg — ABNORMAL LOW (ref 44.0–60.0)
pH, Ven: 7.54 — ABNORMAL HIGH (ref 7.250–7.430)
pO2, Ven: 35 mmHg (ref 32.0–45.0)

## 2021-02-23 LAB — BASIC METABOLIC PANEL
Anion gap: 10 (ref 5–15)
BUN: 12 mg/dL (ref 8–23)
CO2: 27 mmol/L (ref 22–32)
Calcium: 8.3 mg/dL — ABNORMAL LOW (ref 8.9–10.3)
Chloride: 98 mmol/L (ref 98–111)
Creatinine, Ser: 1.05 mg/dL — ABNORMAL HIGH (ref 0.44–1.00)
GFR, Estimated: 55 mL/min — ABNORMAL LOW (ref >60)
Glucose, Bld: 117 mg/dL — ABNORMAL HIGH (ref 70–99)
Potassium: 4.5 mmol/L (ref 3.5–5.1)
Sodium: 135 mmol/L (ref 135–145)

## 2021-02-23 LAB — CBC
HCT: 30.5 % — ABNORMAL LOW (ref 36.0–46.0)
Hemoglobin: 9.7 g/dL — ABNORMAL LOW (ref 12.0–15.0)
MCH: 27.9 pg (ref 26.0–34.0)
MCHC: 31.8 g/dL (ref 30.0–36.0)
MCV: 87.6 fL (ref 80.0–100.0)
Platelets: 188 10*3/uL (ref 150–400)
RBC: 3.48 MIL/uL — ABNORMAL LOW (ref 3.87–5.11)
RDW: 13.9 % (ref 11.5–15.5)
WBC: 10.1 10*3/uL (ref 4.0–10.5)
nRBC: 0 % (ref 0.0–0.2)

## 2021-02-23 LAB — TROPONIN I (HIGH SENSITIVITY)
Troponin I (High Sensitivity): 44 ng/L — ABNORMAL HIGH (ref <18)
Troponin I (High Sensitivity): 44 ng/L — ABNORMAL HIGH (ref <18)

## 2021-02-23 MED ORDER — METHYLPREDNISOLONE SODIUM SUCC 125 MG IJ SOLR
125.0000 mg | Freq: Once | INTRAMUSCULAR | Status: AC
  Administered 2021-02-23: 125 mg via INTRAVENOUS
  Filled 2021-02-23: qty 2

## 2021-02-23 MED ORDER — ALBUTEROL SULFATE (2.5 MG/3ML) 0.083% IN NEBU
3.0000 mL | INHALATION_SOLUTION | Freq: Once | RESPIRATORY_TRACT | Status: AC
  Administered 2021-02-23: 3 mL via RESPIRATORY_TRACT
  Filled 2021-02-23: qty 3

## 2021-02-23 MED ORDER — LORAZEPAM 2 MG/ML IJ SOLN
0.5000 mg | Freq: Once | INTRAMUSCULAR | Status: AC
  Administered 2021-02-23: 0.5 mg via INTRAVENOUS
  Filled 2021-02-23: qty 1

## 2021-02-23 NOTE — ED Notes (Signed)
Patient ambulated to restroom with standby assist and steady gait on room air, SpO2 on room air after ambulating to and from restroom on room air 93%.

## 2021-02-23 NOTE — Consult Note (Signed)
Cardiology Consultation:   Patient ID: [name] MRN: [id number]; DOB: Oct 14, 1945  Admit date: 02/23/2021 Date of Consult: 02/23/2021  PCP:  Rusty Aus, MD   [facility] Providers Cardiologist:  [name], MD        Patient Profile:   [name] is a 75 y.o. 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     History of Present Illness:   Ms. Trine with above complex PMH as above.   She followed Dr Angelena Form historically. For CAD and aortic stenosis,  she had CABG (L-LAD, S-RCA) with AVR with a bioprosthetic valve (21 mm Stennett magna ease) on 10/12/2013 by Dr Cyndia Bent. She had chronic chest pain with severely impaired exercise capacity since. She had been hospitalized multiple times for angina and underwent multiple cardiac cath with interventions throughout the years, underwent DES placement to RCA and midCx in 10/2007, balloon angioplasty to proximal circumflex in 05/2016 and DES placement to mid left Cx and SVG to RCA in 11/14/2020.   She had hx of first and second-degree AV block with sinus bradycardia in 2017 when she was hospitalized for unstable angina, was seen by EP and felt bradycardia and AVB are not related to her dizziness.   She is historically allergic and intolerant to large list of medications (32  allergies), including bradycardia from beta-blocker, myalgia to statin, weakness to Zetia, cough to lisinopril, chest pain to Brilinta, GI bleed to Effient.  She was referred to lipid clinic at one point, was not able to afford Repatha.   She switched to see [facility] cardiology Dr. Nehemiah Massed on 10/24/20, underwent cardiac catheterization on 11/14/2020 for unstable angina, per office note 12/08/20, her cardiac cath showed "significant stenoses bypass graft to PDA and native circumflex artery stenosis with an occluded bypass graft to obtuse marginal. LIMA to the LAD is stable and therefore underwent PCI and stent placement the graft to PDA and the native left circumflex artery. The patient does have progression abnormal bioprosthetic aortic valve with moderate increase in velocities possibly consistent with her symptoms of shortness of breath."  She had an echo 10/11/20 EF >55%, that showed a increasing peak gradient to 3.8 m/s. She was continued on medical management and re-assessment for cardiovascular surgery.   She was recently hospitalized at [facility] for shortness of breath from 9/7-9/12.  She was evaluated by cardiology Dr Corky Sox there, SOB was felt due to flash pulmonary edema, COPD exacerbation and possible worsening aortic stenosis.  She underwent right and left heart catheterization on 02/10/2021 due to chest tightness and elevated troponin which revealed severe native three-vessel CAD including CTO of mid LAD, CTO of mid RCA, patent stent in the mid left Cx; patent SVG to RCA with 2 patent stents in proximal and distal segments; patent LIMA to LAD; RA 6, RV 38/5, PA 37/16 (25), PCWP 15; Fick CO equals 3.91, Fick CI 1.93.  Aortic  valve cannot be crossed, TEE was recommended the following week.  She was felt adequately diuresed and resumed home meds torsemide 20 mg daily at the time of discharge.  She was continued aspirin and Plavix for stent placed in June 2022 at [facility], no new coronary intervention was done on  02/10/21.  Furthermore, she had occult positive stool with mild drop of hemoglobin, GI was consulted and recommended no inpatient procedures.  She followed up with [facility] cardiology outpatient last on 02/12/2021, was recommended TEE for further aortic valve evaluation.  TTE repeated on 9/12 revealed EF 50 to 55%, no RMWA, near LVH, grade 3 DD, mild MR, trivial AI, mild to moderate aortic stenosis, aortic valve mean gradient 18.5, aortic valve peak gradient 33.9, aortic valve area by VTI measures 1.74 cm. There is a bioprosthetic valve present in the aortic position (worsened comparing to echo from 02/02/2021). She never completed TEE and husband states this was felt not needed by her cardiologist.   Patient presented to the [facility] ER today complaining shortness of breath.  She reports multiple recent admission to [facility] regional for SOB.   She endorses 2 to 3 weeks worsening of shortness of breath, was not able to provide much history due to acute respiratory distress.  She states she was seen by her pulmonologist 2 days ago and felt she needs valve replacement.  She reports associated midsternal chest pain as well. She was noted tachypneic with RR above 30s, pulse ox 100% on 4 L nasal cannula oxygen at ED.  Diagnostic work-up revealed stable CKD stage III on BMP, chronic normocytic anemia with hemoglobin 9.7, HS trop 44 >44. VBG with pH 7.54 and PCO2 31.3.  Chest x-ray revealed no acute cardiopulmonary abnormality.  EKG revealed sinus rhythm with ventricular rate of 94, PACs, LVH, old TWI of I and aVL.  Cardiology is consulted today for further input.   During encounter, she is extremity anxious, states she needs to leave now. She was sitting in the chair, not waring her oxygen, restless and felt the room is too hot. Her husband states he was not able to drive and they have been seeing [facility] cardiology clinic over the past year. He was not aware [facility] cardiology is not the same as [facility] heart care and  thought Dr Angelena Form goes to [facility] as well. He states he wants patient's care to be under Dr Angelena Form at this time. Patient reports progressively worsening of SOB with exertion over the past 71yr. Over the past few weeks, she noted her SOB is much worsened. She is easily winded when walking to the bathroom. She endorses intermittent chest pressure and discomfort. Husband states patient was told TEE is not needed and her heart valve is bad and she was told coming to [facility] for a "new valve". She has been taking her torsemide daily, urinating well, denied any peripheral edema. She never sleeps flat, c/o SOB and back pain when lay flat. She does not monitor her weight.  She denied fever, chills, cough, nausea, emesis, diarrhea, syncope.    Past Medical History:  Diagnosis Date   1st degree AV block    ACE-inhibitor cough    Allergic rhinitis    Anemia    iron deficiency anemia   Anxiety    Aortic ectasia (HCC)    a. CT abd in 12/2016 incidentally noted aortic atherosclerosis and infrarenal abdominal aortic ectasia measuring as large as 2.7 cm with recommendation to repeat US in 2023.   Arthritis  Asthma    Cataract    Chronic depression    Chronic diastolic CHF (congestive heart failure) (HCC)    Chronic headache    COPD (chronic obstructive pulmonary disease) (HCC)    Coronary artery disease    a. DES to RCA and mid Cx 2009. b. CABG and bioprosthetic AVR May 2015. c. cutting balloon to prox Cx in 05/2016   Diabetes mellitus    type 2   Diverticulitis of colon    Essential hypertension    GERD (gastroesophageal reflux disease)    Hearing loss    History of blood transfusion 2013   History of prosthetic aortic valve replacement    HOH (hard of hearing)    Hypercholesterolemia    intolerance of statins and niaspan   IDA (iron deficiency anemia) 02/03/2019   Mobitz type 1 second degree AV block    OSA (obstructive sleep apnea)    mild, intolerant of cpap   PAD (peripheral artery  disease) ([place])    a. atherosclerosis by CT abd 12/2016 in [place].   PONV (postoperative nausea and vomiting)    Statin intolerance    Thyroid disease     Past Surgical History:  Procedure Laterality Date   ABDOMINAL HYSTERECTOMY     ABDOMINAL HYSTERECTOMY W/ PARTIAL VAGINACTOMY     AORTIC VALVE REPLACEMENT N/A 10/12/2013   Procedure: AORTIC VALVE REPLACEMENT (AVR);  Surgeon: [name], MD;  Location: [place];  Service: Open Heart Surgery;  Laterality: N/A;   APPENDECTOMY  1964   BARTHOLIN GLAND CYST EXCISION     BLADDER SUSPENSION     BREAST BIOPSY Bilateral 09/11/2000   neg   BREAST BIOPSY Left 07/24/2010   neg   BREAST CYST EXCISION  1988   bilateral nonmalignant tumors, x3   CARDIAC CATHETERIZATION     CARDIAC CATHETERIZATION N/A 05/25/2016   Procedure: Coronary Balloon Angioplasty;  Surgeon: Leonie Man, MD;  Location: [place] CV LAB;  Service: Cardiovascular;  Laterality: N/A;   CARDIAC CATHETERIZATION N/A 05/25/2016   Procedure: Coronary/Graft Angiography;  Surgeon: Leonie Man, MD;  Location: [place] CV LAB;  Service: Cardiovascular;  Laterality: N/A;   CATARACT EXTRACTION W/ INTRAOCULAR LENS  IMPLANT, BILATERAL     CHOLECYSTECTOMY  2001   COLECTOMY     lap sigmoid   COLONOSCOPY  2014   polyps found, 2 clamped off.   CORONARY ANGIOPLASTY  10/29/2007   Prox RCA & Mid Cx.   CORONARY ARTERY BYPASS GRAFT N/A 10/12/2013   Procedure: CORONARY ARTERY BYPASS GRAFT TIMES TWO;  Surgeon: [name], MD;  Location: [place] OR;  Service: Open Heart Surgery;  Laterality: N/A;   CORONARY/GRAFT ANGIOGRAPHY N/A 09/20/2017   Procedure: CORONARY/GRAFT ANGIOGRAPHY;  Surgeon: Sherren Mocha, MD;  Location: [place] CV LAB;  Service: Cardiovascular;  Laterality: N/A;   LEFT HEART CATHETERIZATION WITH CORONARY ANGIOGRAM N/A 10/09/2013   Procedure: LEFT HEART CATHETERIZATION WITH CORONARY ANGIOGRAM;  Surgeon: [name], MD;  Location: [facility] CATH LAB;  Service:  Cardiovascular;  Laterality: N/A;   RIGHT/LEFT HEART CATH AND CORONARY ANGIOGRAPHY N/A 11/03/2020   Procedure: RIGHT/LEFT HEART CATH AND CORONARY ANGIOGRAPHY;  Surgeon: Corey Skains, MD;  Location: [place] CV LAB;  Service: Cardiovascular;  Laterality: N/A;   RIGHT/LEFT HEART CATH AND CORONARY/GRAFT ANGIOGRAPHY N/A 02/10/2021   Procedure: RIGHT/LEFT HEART CATH AND CORONARY/GRAFT ANGIOGRAPHY;  Surgeon: Andrez Grime, MD;  Location: [place] CV LAB;  Service: Cardiovascular;  Laterality: N/A;   STERNAL WIRES  REMOVAL N/A 04/13/2014   Procedure: STERNAL WIRES REMOVAL;  Surgeon: [name], MD;  Location: [facility];  Service: Thoracic;  Laterality: N/A;   TEE WITHOUT CARDIOVERSION N/A 11/03/2020   Procedure: TRANSESOPHAGEAL ECHOCARDIOGRAM (TEE);  Surgeon: Corey Skains, MD;  Location: [facility] ORS;  Service: Cardiovascular;  Laterality: N/A;   TEE WITHOUT CARDIOVERSION N/A 02/13/2021   Procedure: TRANSESOPHAGEAL ECHOCARDIOGRAM (TEE);  Surgeon: Corey Skains, MD;  Location: [facility] ORS;  Service: Cardiovascular;  Laterality: N/A;   THYROIDECTOMY     TONSILLECTOMY     TUBAL LIGATION     VAGINAL DELIVERY     3   VISCERAL ARTERY INTERVENTION N/A 08/16/2016   Procedure: Visceral Artery Intervention;  Surgeon: Algernon Huxley, MD;  Location: [place] CV LAB;  Service: Cardiovascular;  Laterality: N/A;     Home Medications:  Prior to Admission medications   Medication Sig Start Date End Date Taking? Authorizing Provider  acetaminophen (TYLENOL) 500 MG tablet Take 500-1,000 mg by mouth every 6 (six) hours as needed (pain).    [provider]  albuterol (PROVENTIL) (2.5 MG/3ML) 0.083% nebulizer solution Take 2.5 mg by nebulization every 4 (four) hours as needed for shortness of breath or wheezing. 06/07/20   [provider]  ANORO ELLIPTA 62.5-25 MCG/INH AEPB Inhale 1 puff into the lungs daily. 10/11/20   [provider]  aspirin EC 81 MG EC tablet Take 1 tablet  (81 mg total) by mouth daily. Swallow whole. 08/10/20   Samuella Cota, MD  Calcium-Vitamin D (CALTRATE 600 PLUS-VIT D PO) Take 1 tablet by mouth 2 (two) times daily.    [provider]  clopidogrel (PLAVIX) 75 MG tablet Take 75 mg by mouth daily. 11/15/20   [provider]  DULoxetine (CYMBALTA) 20 MG capsule Take 1 capsule (20 mg total) by mouth in the morning. 11/26/20   Jennye Boroughs, MD  FLUoxetine (PROZAC) 40 MG capsule Take 40 mg by mouth 2 (two) times daily. 08/23/20   [provider]  guaiFENesin (MUCINEX) 600 MG 12 hr tablet Take 2 tablets (1,200 mg total) by mouth 2 (two) times daily. 02/13/21   Aline August, MD  hydrOXYzine (ATARAX/VISTARIL) 25 MG tablet Take 1 tablet (25 mg total) by mouth 3 (three) times daily. 02/02/21 03/04/21  Mariel Aloe, MD  insulin lispro (HUMALOG) 100 UNIT/ML injection Inject 0-54 Units into the skin 4 (four) times daily -  before meals and at bedtime.    [provider]  insulin NPH-regular Human (NOVOLIN 70/30) (70-30) 100 UNIT/ML injection Inject 60 Units into the skin 2 (two) times daily with a meal. Inject 60u under the skin every morning at breakfast and inject 60u under the skin every evening with supper    [provider]  isosorbide mononitrate (IMDUR) 30 MG 24 hr tablet Take 1 tablet (30 mg total) by mouth daily. 11/03/20 11/03/21  Corey Skains, MD  levothyroxine (SYNTHROID, LEVOTHROID) 125 MCG tablet Take 125 mcg by mouth daily before breakfast. 04/03/17   [provider]  losartan (COZAAR) 25 MG tablet Take 1 tablet (25 mg total) by mouth in the morning. 11/26/20   Jennye Boroughs, MD  methylPREDNISolone (MEDROL DOSEPAK) 4 MG TBPK tablet Take one tablet (4 mg) at bedtime and take one tablet in the morning. 02/13/21   Aline August, MD  pantoprazole (PROTONIX) 40 MG tablet Take 40 mg by mouth in the morning.    [provider]  promethazine (PHENERGAN) 25 MG tablet Take 25  mg by mouth every  6 (six) hours as needed for nausea or vomiting.    [provider]  torsemide (DEMADEX) 20 MG tablet Take 20 mg by mouth daily. 12/08/20   [provider]  traZODone (DESYREL) 100 MG tablet Take 200 mg by mouth at bedtime.    [provider]    Inpatient Medications: Scheduled Meds:  Continuous Infusions:  PRN Meds:   Allergies:    Allergies  Allergen Reactions   Amitriptyline Other (See Comments)    Unknown reaction   Benadryl [Diphenhydramine] Shortness Of Breath   Demerol [Meperidine] Other (See Comments)    Unknown reaction   Gabapentin Other (See Comments)    Unknown reaction   Mirtazapine Other (See Comments)    Unknown reaction   Olanzapine Other (See Comments)    Unknown reaction    Voltaren [Diclofenac Sodium] Shortness Of Breath   Zetia [Ezetimibe] Other (See Comments)    Weakness in legs, shakiness all over   Ativan [Lorazepam] Other (See Comments)    Causes double vision at highter than .5 mg dose   Atorvastatin Other (See Comments)    Muscle aches and weakness   Budesonide-Formoterol Fumarate Other (See Comments)    Shakiness, tremors   Bupropion Hcl Other (See Comments)    "cloud over me" depression   Caffeine Other (See Comments)    jitters   Codeine Sulfate Other (See Comments)    Makes chest hurt like a heart attack   Lisinopril Cough   Metformin Nausea And Vomiting   Mometasone Furoate Nausea And Vomiting   Morphine Sulfate Other (See Comments)    Chest pain like a heart attack   Other Other (See Comments)    Beta Blockers, reaction shortness of breath   Oxycodone-Acetaminophen Nausea And Vomiting   Pioglitazone Other (See Comments)    Cannot take because of risk of bladder cancer   Propoxyphene N-Acetaminophen Nausea And Vomiting   Rosuvastatin Other (See Comments)    Muscle aches and weakness   Shellfish Allergy Diarrhea   Suvorexant Other (See Comments)    Jerking/nervous    Ticagrelor     Other reaction(s):  Other (See Comments) "slowed heart rate" & chest pain   Tramadol Nausea Only   Venlafaxine Other (See Comments)    Unknown reaction   Xanax [Alprazolam] Other (See Comments)    Mental status changes, "didn't know where she was at" per husband at bedside   Zolpidem Tartrate Other (See Comments)     Jittery, diarrhea   Latex Rash    Social History:   Social History   Socioeconomic History   Marital status: Married    Spouse name: Not on file   Number of children: 3   Years of education: Not on file   Highest education level: Not on file  Occupational History   Occupation: Retired    Fish farm manager: UNEMPLOYED    Comment: CNA  Tobacco Use   Smoking status: Former    Packs/day: 0.50    Years: 30.00    Pack years: 15.00    Types: Cigarettes    Quit date: 10/02/2013    Years since quitting: 7.4   Smokeless tobacco: Never  Vaping Use   Vaping Use: Never used  Substance and Sexual Activity   Alcohol use: No   Drug use: No   Sexual activity: Not Currently  Other Topics Concern   Not on file  Social History Narrative   Does not have Living Will   Desires  CPR, would not want prolonged life support if futile.   Social Determinants of Health   Financial Resource Strain: Not on file  Food Insecurity: Not on file  Transportation Needs: Not on file  Physical Activity: Not on file  Stress: Not on file  Social Connections: Not on file  Intimate Partner Violence: Not on file    Family History:    Family History  Problem Relation Age of Onset   Breast cancer Mother 7   Hypertension Father    Mesothelioma Father    Asthma Father    Stroke Paternal Grandfather    Heart disease Other    Breast cancer Maternal Aunt    Breast cancer Paternal Aunt      ROS:  Constitutional: Denied fever, chills, malaise, night sweats Eyes: Denied vision change or loss Ears/Nose/Mouth/Throat: Denied ear ache, sore throat, coughing, sinus pain Cardiovascular: see HPI  Respiratory: see HPI   Gastrointestinal: Denied nausea, vomiting, abdominal pain, diarrhea Genital/Urinary: Denied dysuria, hematuria, urinary frequency/urgency Musculoskeletal: Denied muscle ache, joint pain, weakness Skin: Denied rash, wound Neuro: Denied headache, dizziness, syncope Psych: Denied history of depression/anxiety  Endocrine: Denied history of diabetes     Physical Exam/Data:   Vitals:   02/23/21 0818 02/23/21 1400  BP: 117/77 110/60  Pulse: 98 86  Resp: (!) 32 18  Temp: 98.7 F (37.1 C)   TempSrc: Oral   SpO2: 96% 100%   No intake or output data in the 24 hours ending 02/23/21 1614 Last 3 Weights 02/12/2021 02/11/2021 02/10/2021  Weight (lbs) 200 lb 214 lb 8.1 oz 204 lb 14.4 oz  Weight (kg) 90.719 kg 97.3 kg 92.942 kg     There is no height or weight on file to calculate BMI.  Vitals:  Vitals:   02/23/21 0818 02/23/21 1400  BP: 117/77 110/60  Pulse: 98 86  Resp: (!) 32 18  Temp: 98.7 F (37.1 C)   SpO2: 96% 100%   General Appearance: In no apparent distress, sitting in chair  HEENT: Normocephalic, atraumatic.  Neck: Supple, trachea midline, no JVDs Cardiovascular: Regular rate and rhythm, normal B1-D1,  systolic murmur grade II RUSB Respiratory: Resting breathing labored, lungs sounds with mild expiratory wheezing and rales at base, no use of accessory muscles. On 4LNC.  Gastrointestinal: Bowel sounds positive, abdomen soft, non-tender, non-distended.  Extremities: Able to move all extremities in bed without difficulty, no edema/cyanosis/clubbing Genitourinary: genital exam not performed Musculoskeletal: Normal muscle bulk and tone Skin: Intact, warm, dry. No rashes or petechiae noted in exposed areas.  Neurologic: Alert, oriented to person, place and time. Fluent speech,  no cognitive deficit, no gross focal neuro deficit Psychiatric: Anxious    EKG:  The EKG was personally reviewed and demonstrates:  Admission EKG revealed sinus rhythm with ventricular rate of 94, PACs,  LVH, old TWI of I and aVL  Telemetry:  Telemetry was personally reviewed and demonstrates:  Sinus rhythm with frequent PACs   Relevant CV Studies:  Echo from 02/13/21:   1. Left ventricular ejection fraction, by estimation, is 50 to 55%. The  left ventricle has low normal function. The left ventricle has no regional  wall motion abnormalities. There is severe left ventricular hypertrophy.  Left ventricular diastolic  parameters are consistent with Grade III diastolic dysfunction  (restrictive).   2. Right ventricular systolic function is normal. The right ventricular  size is normal.   3. The mitral valve is rheumatic. Mild mitral valve regurgitation.   4. The aortic valve has been  repaired/replaced. Aortic valve  regurgitation is trivial. Mild to moderate aortic valve stenosis.   Echo from 02/02/21:   1. Left ventricular ejection fraction, by estimation, is 55 to 60%. The  left ventricle has normal function. The left ventricle has no regional  wall motion abnormalities. Left ventricular diastolic parameters were  normal.   2. Right ventricular systolic function is normal. The right ventricular  size is normal.   3. The mitral valve is normal in structure. Mild mitral valve  regurgitation. Moderate mitral annular calcification.   4. The aortic valve is normal in structure. Aortic valve regurgitation is  not visualized.    Right and left heart cath on 02/10/21:    Mid LAD lesion is 100% stenosed.   Ost RCA lesion is 99% stenosed.   Ost RCA to Prox RCA lesion is 20% stenosed.   Prox RCA lesion is 80% stenosed.   Mid RCA lesion is 100% stenosed.   Prox Cx to Mid Cx lesion is 50% stenosed.   Previously placed Origin to Prox Graft stent (unknown type) is  widely patent.   Previously placed Dist Graft to Insertion stent (unknown type) is  widely patent.   Previously placed Prox Cx stent (unknown type) is  widely patent.   SVG and is large.   LIMA and is normal in caliber and  large.   Patient agitation and frequent movement made the procedure technically difficult.    Conclusion: Severe native three-vessel coronary artery disease including CTO of the mid LAD, CTO of the mid RCA, and patent stent in the mid left circumflex. Patent SVG to RCA with 2 patent stents in the proximal and distal segments. Patent LIMA to LAD RA 6, RV 38/5, PA 37/16 (25), PCWP 15 Fick CO equals 3.91, Fick CI 1.93   Recommendation Unable to cross the aortic valve during this procedure.  She needs additional evaluation to determine the severity of her aortic valve stenosis.  Likely we will perform a TEE next week. She has been appropriately diuresed with a RA pressure of 6 and pulmonary capillary wedge pressure 15.  I have ordered her home torsemide of 20 mg daily to be started tomorrow. Continue aspirin and Plavix as previously prescribed for stent placed in June 2022   Diagnostic Dominance: Right Intervention  Laboratory Data:  High Sensitivity Troponin:   Recent Labs  Lab 02/09/21 0810 02/09/21 1356 02/09/21 2035 02/23/21 0820 02/23/21 1104  TROPONINIHS 1,360* 459* 619* 44* 44*     Chemistry Recent Labs  Lab 02/23/21 0820 02/23/21 1115  NA 135 137  K 4.5 3.8  CL 98  --   CO2 27  --   GLUCOSE 117*  --   BUN 12  --   CREATININE 1.05*  --   CALCIUM 8.3*  --   GFRNONAA 55*  --   ANIONGAP 10  --     No results for input(s): PROT, ALBUMIN, AST, ALT, ALKPHOS, BILITOT in the last 168 hours. Lipids No results for input(s): CHOL, TRIG, HDL, LABVLDL, LDLCALC, CHOLHDL in the last 168 hours.  Hematology Recent Labs  Lab 02/23/21 0820 02/23/21 1115  WBC 10.1  --   RBC 3.48*  --   HGB 9.7* 9.2*  HCT 30.5* 27.0*  MCV 87.6  --   MCH 27.9  --   MCHC 31.8  --   RDW 13.9  --   PLT 188  --    Thyroid No results for input(s): TSH, FREET4 in the last 168 hours.  BNPNo results for input(s): BNP, PROBNP in the last 168 hours.  DDimer No results for input(s): DDIMER in  the last 168 hours.   Radiology/Studies:  DG Chest 2 View  Result Date: 02/23/2021 CLINICAL DATA:  shortness of breath EXAM: CHEST - 2 VIEW COMPARISON:  02/09/2021 FINDINGS: Previous median sternotomy and CABG procedure. Mild cardiac enlargement. No pleural effusion or edema. No airspace densities. Visualized osseous structures appear intact. IMPRESSION: No active cardiopulmonary abnormalities. Electronically Signed   By: Kerby Moors M.D.   On: 02/23/2021 09:22     Assessment and Plan:    Acute on chronic hypoxic respiratory failure - SOB is multifactorial due to AS, anxiety, end stage COPD - on 4LNC, baseline on 3LNC, wean to baseline   Elevated troponin CAD with history of CABG 2015 and multiple PCI's (last DES 11/14/2020) - Hs Trop 44 x2 - EKG without acute ischemic changes  - recent cath from 02/10/21 showed Severe native three-vessel coronary artery disease including CTO of the mid LAD, CTO of the mid RCA, and patent stent in the mid left circumflex. Patent SVG to RCA with 2 patent stents in the proximal and distal segments. Patent LIMA to LAD. - demand ischemia, no further ischemic workup needed - continue ASA 81mg , Plavix 75mg  daily, losartan 25mg , no BB and statin due to intolerance   Hx of aortic stenosis with AVR 2015 with now malfunction of bioprosthetic valve  - TTE repeated on 9/12 revealed EF 50 to 55%, no RMWA, near LVH, grade 3 DD, mild MR, trivial AI, mild to moderate aortic stenosis, aortic valve mean gradient 18.5, aortic valve peak gradient 33.9, aortic valve area by VTI measures 1.74 cm. There is a bioprosthetic valve present in the aortic position (worsened comparing to echo from 02/02/2021).  - Reviewed imaging with MD, felt quality is poor, need repeat Echo and CT chest for further evaluation of bioprosthetic valve, will defer to MD on discussion for inpatient versus outpatient work up   Chronic diastolic heart failure - CXR no acute findings today - SOB is not  entirely from CHF, clinically does not appears significantly overloaded today - continue torsemide 20mg  daily  - limited GDMT due to large list of intolerance /allergy, continue home med losartan  Hypertension - BP controlled, continue torsemide and losartan as above   Hyperlipidemia - intolerant to Zetia and statin, unable afford repatha   COPD with mild exacerbation  CKD stage IIIb Chronic normocytic anemia Type 2 diabetes Anxiety with depression Generalized debility Hypothyroidism - addressed by PCP/Pulmonology    Risk Assessment/Risk Scores:   New York Heart Association (NYHA) Functional Class NYHA Class III          For questions or updates, please contact [facility] Please consult [email or web address] for contact info under    Signed, Margie Billet, NP  02/23/2021 4:14 PM

## 2021-02-23 NOTE — ED Provider Notes (Signed)
[facility] EMERGENCY DEPARTMENT Provider Note   CSN: [id number] Arrival date & time: 02/23/21  8891     History Chief Complaint  Patient presents with   Chest Pain   Shortness of Breath    [name] is a 75 y.o. female with PMHx significant for CAD s/p CABG, aortic valve dysfunction, pulmonary HTN, recent NSTEMI, COPD, DM2, CKD, depression and anxiety who is presenting with shortness of breath.  Patient is on 3L home O2 for chronic respiratory failure. Reports she's had worsening shortness of breath over the past 2-3 weeks, and has had several admissions to [facility]. Majority of history is provided by patient's husband, as she is in acute distress. He states she needs a valve replacement but it can't be done at [facility]. She was seen by her pulmonologist 2 days ago, who felt she needed admission to [facility] for possible valve replacement.  Patient repeatedly stating "please help me", "please do something", and "I can't breath". Endorses associated midsternal chest pain. These symptoms have been present intermittently over the past 2-3 weeks. Denies fever, increased cough, nausea, vomiting, abdominal pain, leg swelling, or syncope.     Past Medical History:  Diagnosis Date   1st degree AV block    ACE-inhibitor cough    Allergic rhinitis    Anemia    iron deficiency anemia   Anxiety    Aortic ectasia (HCC)    a. CT abd in 12/2016 incidentally noted aortic atherosclerosis and infrarenal abdominal aortic ectasia measuring as large as 2.7 cm with recommendation to repeat US in 2023.   Arthritis    Asthma    Cataract    Chronic depression    Chronic diastolic CHF (congestive heart failure) (HCC)    Chronic headache    COPD (chronic obstructive pulmonary disease) (HCC)    Coronary artery disease    a. DES to RCA and mid Cx 2009. b. CABG and bioprosthetic AVR May 2015. c. cutting balloon to prox Cx in 05/2016   Diabetes mellitus    type 2    Diverticulitis of colon    Essential hypertension    GERD (gastroesophageal reflux disease)    Hearing loss    History of blood transfusion 2013   History of prosthetic aortic valve replacement    HOH (hard of hearing)    Hypercholesterolemia    intolerance of statins and niaspan   IDA (iron deficiency anemia) 02/03/2019   Mobitz type 1 second degree AV block    OSA (obstructive sleep apnea)    mild, intolerant of cpap   PAD (peripheral artery disease) ([place])    a. atherosclerosis by CT abd 12/2016 in [place].   PONV (postoperative nausea and vomiting)    Statin intolerance    Thyroid disease     Patient Active Problem List   Diagnosis Date Noted   Acute on chronic diastolic (congestive) heart failure (HCC) 11/24/2020   SOB (shortness of breath) 11/24/2020   Acute hyponatremia 11/24/2020   Prolonged QT interval 11/24/2020   Angina pectoris ([place]) 11/03/2020   CHF exacerbation ([place]) 09/07/2020   Elevated troponin 09/07/2020   Chest pain 08/09/2020   Acute on chronic diastolic CHF (congestive heart failure) ([place]) 08/08/2020   Anemia due to stage 3a chronic kidney disease ([place]) 05/21/2019   IDA (iron deficiency anemia) 02/03/2019   COPD exacerbation ([place]) 12/28/2017   Depression, major, single episode, complete remission ([place]) 04/11/2017   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) 08/24/2016   Hyponatremia 08/24/2016   Leukocytosis 08/24/2016   Fever 08/24/2016   Diarrhea 08/24/2016   Generalized weakness 08/24/2016   Acute diverticulitis 08/16/2016   Gastrointestinal hemorrhage    Ataxia 08/09/2016   Dizziness 08/09/2016   Staggering gait 08/09/2016   NSTEMI (non-ST elevated myocardial infarction) [facility])    AVB (atrioventricular  block)    Atypical chest pain    Unstable angina ([place]) 05/25/2016   Palpitations 05/25/2016   PUD - recent Rx for H.Pylori 05/25/2016   Stenosis of coronary artery stent    Recurrent major depressive disorder, in partial remission ([place]) 07/29/2015   Sialadenitis 04/25/2015   Viral URI 03/24/2015   Hypoglycemia 08/25/2014   Nausea without vomiting 08/25/2014   Dyslipidemia-statin ontol    Long-term insulin use ([place]) 03/31/2014   Aortic valve disorder 11/11/2013   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 10/12/2013   Coronary Artery Disease s/p CABG 10/2013 10/10/2013   Other malaise and fatigue 09/11/2013   Alopecia 03/06/2013   Insomnia 02/06/2013   Gastroesophageal reflux disease without esophagitis 01/19/2013   Dysphagia, unspecified(787.20) 01/19/2013   Dyspnea 11/17/2012   Weakness 11/17/2012   Abnormal MRI of head 09/05/2012   Depression with anxiety 03/21/2012   Extrinsic asthma 11/12/2011   Anemia 06/07/2011   Chronic obstructive pulmonary disease ([place]) 01/25/2010   Acquired hypothyroidism 08/10/2009   MICROSCOPIC HEMATURIA 05/07/2009   GOITER 03/02/2009   Essential hypertension 01/06/2009   MEMORY LOSS 06/23/2008   Obstructive sleep apnea 12/25/2007   DM type 2 (diabetes mellitus, type 2) ([place]) 09/09/2006   ALLERGIC RHINITIS 09/09/2006   DIVERTICULOSIS, COLON 09/09/2006    Past Surgical History:  Procedure Laterality Date   ABDOMINAL HYSTERECTOMY     ABDOMINAL HYSTERECTOMY W/ PARTIAL VAGINACTOMY     AORTIC VALVE REPLACEMENT N/A 10/12/2013   Procedure: AORTIC VALVE REPLACEMENT (AVR);  Surgeon: [name], MD;  Location: [place];  Service: Open Heart Surgery;  Laterality: N/A;   APPENDECTOMY  1964   BARTHOLIN GLAND CYST EXCISION     BLADDER SUSPENSION     BREAST BIOPSY Bilateral 09/11/2000   neg   BREAST BIOPSY Left 07/24/2010   neg   BREAST CYST EXCISION  1988   bilateral nonmalignant tumors, x3   CARDIAC CATHETERIZATION     CARDIAC CATHETERIZATION N/A 05/25/2016   Procedure:  Coronary Balloon Angioplasty;  Surgeon: Leonie Man, MD;  Location: [facility] CV [facility];  Service: Cardiovascular;  Laterality: N/A;   CARDIAC CATHETERIZATION N/A 05/25/2016   Procedure: Coronary/Graft Angiography;  Surgeon: Leonie Man, MD;  Location: [place] [facility];  Service: Cardiovascular;  Laterality: N/A;   CATARACT EXTRACTION W/ INTRAOCULAR LENS  IMPLANT, BILATERAL     CHOLECYSTECTOMY  2001   COLECTOMY     lap sigmoid   COLONOSCOPY  2014   polyps found, 2 clamped off.   CORONARY ANGIOPLASTY  10/29/2007   Prox RCA & Mid Cx.   CORONARY ARTERY BYPASS GRAFT N/A 10/12/2013   Procedure: CORONARY ARTERY BYPASS GRAFT TIMES TWO;  Surgeon: [name], MD;  Location: [facility];  Service: Open Heart Surgery;  Laterality: N/A;   CORONARY/GRAFT ANGIOGRAPHY N/A 09/20/2017   Procedure: CORONARY/GRAFT ANGIOGRAPHY;  Surgeon: Sherren Mocha, MD;  Location: [place] [facility];  Service: Cardiovascular;  Laterality: N/A;   LEFT HEART CATHETERIZATION WITH  CORONARY ANGIOGRAM N/A 10/09/2013   Procedure: LEFT HEART CATHETERIZATION WITH CORONARY ANGIOGRAM;  Surgeon: [name], MD;  Location: [facility] CATH LAB;  Service: Cardiovascular;  Laterality: N/A;   RIGHT/LEFT HEART CATH AND CORONARY ANGIOGRAPHY N/A 11/03/2020   Procedure: RIGHT/LEFT HEART CATH AND CORONARY ANGIOGRAPHY;  Surgeon: Corey Skains, MD;  Location: [place];  Service: Cardiovascular;  Laterality: N/A;   RIGHT/LEFT HEART CATH AND CORONARY/GRAFT ANGIOGRAPHY N/A 02/10/2021   Procedure: RIGHT/LEFT HEART CATH AND CORONARY/GRAFT ANGIOGRAPHY;  Surgeon: Andrez Grime, MD;  Location: [place] [facility] LAB;  Service: Cardiovascular;  Laterality: N/A;   STERNAL WIRES REMOVAL N/A 04/13/2014   Procedure: STERNAL WIRES REMOVAL;  Surgeon: [name], MD;  Location: [facility];  Service: Thoracic;  Laterality: N/A;   TEE WITHOUT CARDIOVERSION N/A 11/03/2020   Procedure: TRANSESOPHAGEAL ECHOCARDIOGRAM (TEE);  Surgeon: Corey Skains, MD;  Location: [facility];  Service: Cardiovascular;  Laterality: N/A;   TEE WITHOUT CARDIOVERSION N/A 02/13/2021   Procedure: TRANSESOPHAGEAL ECHOCARDIOGRAM (TEE);  Surgeon: Corey Skains, MD;  Location: [facility] OR[facility];  Service: Cardiovascular;  Laterality: N/A;   THYROIDECTOMY     TONSILLECTOMY     TUBAL LIGATION     VAGINAL DELIVERY     3   VISCERAL ARTERY INTERVENTION N/A 08/16/2016   Procedure: Visceral Artery Intervention;  Surgeon: Algernon Huxley, MD;  Location: [place] [facility];  Service: Cardiovascular;  Laterality: N/A;     OB History     Gravida  3   Para  3   Term      Preterm      AB      Living  3      SAB      IAB      Ectopic      Multiple      Live Births           Obstetric Comments  1st Menstrual Cycle: 39 1st Pregnancy: 44         Family History  Problem Relation Age of Onset   Breast cancer Mother 7   Hypertension Father    Mesothelioma Father    Asthma Father    Stroke Paternal Grandfather    Heart disease Other    Breast cancer Maternal Aunt    Breast cancer Paternal Aunt     Social History   Tobacco Use   Smoking status: Former    Packs/day: 0.50    Years: 30.00    Pack years: 15.00    Types: Cigarettes    Quit date: 10/02/2013    Years since quitting: 7.4   Smokeless tobacco: Never  Vaping Use   Vaping Use: Never used  Substance Use Topics   Alcohol use: No   Drug use: No    Home Medications Prior to Admission medications   Medication Sig Start Date End Date Taking? Authorizing Provider  acetaminophen (TYLENOL) 500 MG tablet Take 500-1,000 mg by mouth every 6 (six) hours as needed (pain).   Yes [provider]  albuterol (PROVENTIL) (2.5 MG/3ML) 0.083% nebulizer solution Take 2.5 mg by nebulization 3 (three) times daily. 06/07/20  Yes [provider]  ANORO ELLIPTA 62.5-25 MCG/INH AEPB Inhale 1 puff into the lungs daily. 10/11/20  Yes [provider]  aspirin EC 81 MG EC tablet Take 1  tablet (81 mg total) by mouth daily. Swallow whole. 08/10/20  Yes Samuella Cota, MD  budesonide (PULMICORT) 1 MG/2ML nebulizer solution Inhale 2  mLs into the lungs at bedtime. 02/15/21 02/15/22 Yes [provider]  clopidogrel (PLAVIX) 75 MG tablet Take 75 mg by mouth daily. 11/15/20  Yes [provider]  DULoxetine (CYMBALTA) 20 MG capsule Take 1 capsule (20 mg total) by mouth in the morning. 11/26/20  Yes Jennye Boroughs, MD  FLUoxetine (PROZAC) 40 MG capsule Take 40 mg by mouth 2 (two) times daily. 08/23/20  Yes [provider]  hydrOXYzine (ATARAX/VISTARIL) 25 MG tablet Take 1 tablet (25 mg total) by mouth 3 (three) times daily. 02/02/21 03/04/21 Yes Mariel Aloe, MD  Insulin Glargine (BASAGLAR KWIKPEN) 100 UNIT/ML Inject 100 Units into the skin at bedtime.   Yes [provider]  insulin lispro (HUMALOG) 100 UNIT/ML injection Inject 0-54 Units into the skin 3 (three) times daily before meals.   Yes [provider]  isosorbide mononitrate (IMDUR) 30 MG 24 hr tablet Take 1 tablet (30 mg total) by mouth daily. 11/03/20 11/03/21 Yes Corey Skains, MD  levothyroxine (SYNTHROID, LEVOTHROID) 125 MCG tablet Take 125 mcg by mouth daily before breakfast. 04/03/17  Yes [provider]  losartan (COZAAR) 25 MG tablet Take 1 tablet (25 mg total) by mouth in the morning. 11/26/20  Yes Jennye Boroughs, MD  ondansetron (ZOFRAN-ODT) 4 MG disintegrating tablet Take 4 mg by mouth 3 (three) times daily as needed for nausea or vomiting. 02/14/21  Yes [provider]  pantoprazole (PROTONIX) 40 MG tablet Take 40 mg by mouth in the morning.   Yes [provider]  torsemide (DEMADEX) 20 MG tablet Take 20 mg by mouth daily. 12/08/20  Yes [provider]  traZODone (DESYREL) 100 MG tablet Take 200 mg by mouth at bedtime.   Yes [provider]  guaiFENesin (MUCINEX) 600 MG 12 hr tablet Take 2 tablets (1,200 mg total) by mouth 2 (two) times  daily. Patient not taking: No sig reported 02/13/21   Aline August, MD  methylPREDNISolone (MEDROL DOSEPAK) 4 MG TBPK tablet Take one tablet (4 mg) at bedtime and take one tablet in the morning. Patient not taking: No sig reported 02/13/21   Aline August, MD  promethazine (PHENERGAN) 25 MG tablet Take 25 mg by mouth every 6 (six) hours as needed for nausea or vomiting. Patient not taking: No sig reported    [provider]    Allergies    Amitriptyline, Benadryl [diphenhydramine], Demerol [meperidine], Gabapentin, Meperidine hcl, Mirtazapine, Olanzapine, Voltaren [diclofenac sodium], Zetia [ezetimibe], Ativan [lorazepam], Atorvastatin, Budesonide-formoterol fumarate, Bupropion hcl, Caffeine, Codeine sulfate, Lisinopril, Metformin, Mometasone furoate, Morphine sulfate, Other, Oxycodone-acetaminophen, Pioglitazone, Propoxyphene n-acetaminophen, Rosuvastatin, Shellfish allergy, Suvorexant, Ticagrelor, Tramadol, Venlafaxine, Xanax [alprazolam], Zolpidem tartrate, and Latex  Review of Systems   Review of Systems  Constitutional:  Negative for fever.  Respiratory:  Positive for shortness of breath. Negative for cough.   Cardiovascular:  Positive for chest pain.  Gastrointestinal:  Negative for abdominal pain, nausea and vomiting.  Neurological:  Negative for syncope.   Physical Exam Updated Vital Signs BP 110/60 (BP Location: Right Arm)   Pulse 86   Temp 98.7 F (37.1 C) (Oral)   Resp 18   SpO2 100%   Physical Exam Constitutional:      General: She is in acute distress.  HENT:     Head: Atraumatic.  Cardiovascular:     Rate and Rhythm: Normal rate and regular rhythm.     Heart sounds: Normal heart sounds.  Pulmonary:     Effort: Tachypnea and respiratory distress present.  Breath sounds: Normal breath sounds. No wheezing or rales.  Abdominal:     Palpations: Abdomen is soft.     Tenderness: There is no abdominal tenderness.  Musculoskeletal:     Right lower leg: No  edema.     Left lower leg: No edema.  Skin:    General: Skin is warm and dry.  Neurological:     General: No focal deficit present.     Mental Status: She is alert.  Psychiatric:        Mood and Affect: Mood is anxious.    ED Results / Procedures / Treatments   Labs (all labs ordered are listed, but only abnormal results are displayed) Labs Reviewed  BASIC METABOLIC PANEL - Abnormal; Notable for the following components:      Result Value   Glucose, Bld 117 (*)    Creatinine, Ser 1.05 (*)    Calcium 8.3 (*)    GFR, Estimated 55 (*)    All other components within normal limits  CBC - Abnormal; Notable for the following components:   RBC 3.48 (*)    Hemoglobin 9.7 (*)    HCT 30.5 (*)    All other components within normal limits  I-STAT VENOUS BLOOD GAS, ED - Abnormal; Notable for the following components:   pH, Ven 7.540 (*)    pCO2, Ven 31.3 (*)    Acid-Base Excess 4.0 (*)    Calcium, Ion 0.98 (*)    HCT 27.0 (*)    Hemoglobin 9.2 (*)    All other components within normal limits  TROPONIN I (HIGH SENSITIVITY) - Abnormal; Notable for the following components:   Troponin I (High Sensitivity) 44 (*)    All other components within normal limits  TROPONIN I (HIGH SENSITIVITY) - Abnormal; Notable for the following components:   Troponin I (High Sensitivity) 44 (*)    All other components within normal limits    EKG EKG Interpretation  Date/Time:  Thursday February 23 2021 08:15:11 EDT Ventricular Rate:  94 PR Interval:  184 QRS Duration: 96 QT Interval:  386 QTC Calculation: 482 R Axis:   8 Text Interpretation: Normal sinus rhythm with sinus arrhythmia Moderate voltage criteria for LVH, may be normal variant ( R in aVL , Cornell product ) ST & T wave abnormality, consider lateral ischemia Abnormal ECG No significant change since last tracing Confirmed by Deno Etienne [phone]) on 02/23/2021 9:38:07 AM  Radiology DG Chest 2 View  Result Date: 02/23/2021 CLINICAL DATA:   shortness of breath EXAM: CHEST - 2 VIEW COMPARISON:  02/09/2021 FINDINGS: Previous median sternotomy and CABG procedure. Mild cardiac enlargement. No pleural effusion or edema. No airspace densities. Visualized osseous structures appear intact. IMPRESSION: No active cardiopulmonary abnormalities. Electronically Signed   By: Kerby Moors M.D.   On: 02/23/2021 09:22    Procedures Procedures   Medications Ordered in ED Medications  LORazepam (ATIVAN) injection 0.5 mg (0.5 mg Intravenous Given 02/23/21 1056)  albuterol (PROVENTIL) (2.5 MG/3ML) 0.083% nebulizer solution 3 mL (3 mLs Inhalation Given 02/23/21 1105)  methylPREDNISolone sodium succinate (SOLU-MEDROL) 125 mg/2 mL injection 125 mg (125 mg Intravenous Given 02/23/21 1203)    ED Course  I have reviewed the triage vital signs and the nursing notes.  Pertinent labs & imaging results that were available during my care of the patient were reviewed by me and considered in my medical decision making (see chart for details).    MDM Rules/Calculators/A&P  This is a 75 year old female with hx of CAD s/p CABG, aortic valve dysfunction s/p replacement, recent NSTEMI, pulmonary HTN, COPD, chronic respiratory failure, DM2 and CKD presenting with shortness of breath.  Patient appears to be in acute distress on exam and is tachpyneic to 28-32. However, her SpO2 is 100% on 4L [place] and her lungs sound clear without wheezes or rales. Remainder of vitals wnl including HR 82 and BP 117/77.  Patient with multiple recent admissions to [facility], where cardiology and pulmonology were closely involved in her care. She had an echo on 9/12 showing EF 50-55% with severe LVH, grade III diastolic dysfunction, and moderate aortic valve stenosis. She also underwent right and left heart cath on 02/10/21 which showed severe native 3-vessel CAD.  Etiology of her symptoms likely multifactorial including COPD exacerbation, pulmonary HTN, aortic  valve disease, and severe CAD. Appears to also be a large component of superimposed anxiety. Does not appear volume overloaded on exam and CXR without acute findings. Troponin flat x2. Will obtain VBG and treat with albuterol inhaler and IV solumedrol. Will also give 0.5mg  IV Ativan and then reassess.  VBG consistent with hyperventilation, likely anxiety related. She is improved after albuterol, solumedrol, and Ativan. Discussed case with cardiology who will evaluate patient in the ED given her complex cardiac history.   Patient remains much improved after Ativan, albuterol, and solumedrol. Awaiting cardiology eval. She was signed out to the oncoming provider to follow up cardiology recommendations and appropriate dispo.  Final Clinical Impression(s) / ED Diagnoses Final diagnoses:  None    Rx / DC Orders ED Discharge Orders     None        Alcus Dad, MD 02/23/21 [place], [place], DO 02/23/21 1558

## 2021-02-23 NOTE — Telephone Encounter (Signed)
Per ER notes, cardiology was consulted.  Plan is for TAVR consult as  outpatient.

## 2021-02-23 NOTE — ED Triage Notes (Signed)
Patient coming from home, complaint of cp, shob for "weeks" pt arrives without home O2, placed on 2L O2.

## 2021-02-23 NOTE — Telephone Encounter (Signed)
Message  I'm sorry but I cannot push the ED to move faster. Our team can see her when she is put in a room.     ----- Message -----    Sent: 02/23/2021   9:37 AM EDT  To: [name], MD, *

## 2021-02-23 NOTE — ED Provider Notes (Signed)
  Physical Exam  BP 110/60 (BP Location: Right Arm)   Pulse 86   Temp 98.7 F (37.1 C) (Oral)   Resp 18   SpO2 100%    ED Course/Procedures     Procedures  MDM    74F presenting with CP, SOB for weeks, concern for aortic stenosis. Symptomatic. Cards consulted.  Cardiology evaluated the patient and felt that high sensitive troponins elevated to 44x2 with an EKG without acute ischemic changes and a recent catheterization from 02/10/2021 which revealed severe native three-vessel coronary artery disease including "CTO of the mid LAD, CTO of the mid RCA, and patent stent in the mid left circumflex. Patent SVG to RCA with 2 patent stents in the proximal and distal segments. Patent LIMA to LAD."  Felt that patient's presentation was likely due to demand ischemia with no further ischemic work-up needed.  Plan to continue ASA 81 mg, Plavix 75 mg daily, losartan 25 mg.  Regarding the patient's history of aortic stenosis with aortic valve replacement in 2015 and now malfunction of the patient's bioprosthetic aortic valve, cardiology feels that patient is stable for discharge with a plan for outpatient TAVR workup.  Explained the disposition plan to the patient bedside.  Comfortable for plan for discharge with outpatient follow-up.      Regan Lemming, MD 02/23/21 [phone]

## 2021-02-23 NOTE — Telephone Encounter (Signed)
  Pt's husband calling, he said pt is having CP and SOB they are in [facility] ED and the wait is for 11 hours, pt was triage 1 hour ago. He is asking if Dr. Angelena Form call the hospital and ask if they can get the pt seen faster

## 2021-02-24 ENCOUNTER — Telehealth: Payer: Self-pay | Admitting: Cardiovascular Disease

## 2021-02-24 DIAGNOSIS — I359 Nonrheumatic aortic valve disorder, unspecified: Secondary | ICD-10-CM

## 2021-02-24 DIAGNOSIS — Z953 Presence of xenogenic heart valve: Secondary | ICD-10-CM

## 2021-02-24 NOTE — Telephone Encounter (Signed)
Dr Audie Box spoke with me yesterday in regards to pt needing a repeat Echocardiogram and follow-up with Dr Angelena Form to discuss bioprosthetic AV stenosis.  I have scheduled the pt for echocardiogram on 9/28 at 10:20 AM and follow-up with Dr Angelena Form on 10/3 at 8:30 AM. Pt's husband aware of appointments.

## 2021-02-24 NOTE — Telephone Encounter (Signed)
Husband of patient called. The patient was seen by Dr. Audie Box while she was in the ED yesterday. The husband states that Dr. Audie Box wanted to order a repeat Echo and CT as the last set of images were not clear. The husband called to schedule those tests but there were no orders in place yet. The husband is not sure what to do. Please advise

## 2021-02-24 NOTE — Telephone Encounter (Signed)
Follow Up:    Patient husband is calling to see if the order have been sent

## 2021-02-27 NOTE — Progress Notes (Deleted)
   Patient ID: [name], female    DOB: January 21, 1946, 75 y.o.   MRN: [id number]  HPI  [name] is a 75 y/o female with a history of  Echo report from 02/13/21 reviewed and showed an EF of 50-55% along with severe LVH, mild MR and mild/ moderate AS.   RHC/LHC done 02/10/21 and showed:  Mid LAD lesion is 100% stenosed.   Ost RCA lesion is 99% stenosed.   Ost RCA to Prox RCA lesion is 20% stenosed.   Prox RCA lesion is 80% stenosed.   Mid RCA lesion is 100% stenosed.   Prox Cx to Mid Cx lesion is 50% stenosed.   Previously placed Origin to Prox Graft stent (unknown type) is  widely patent.   Previously placed Dist Graft to Insertion stent (unknown type) is  widely patent.   Previously placed Prox Cx stent (unknown type) is  widely patent.   SVG and is large.   LIMA and is normal in caliber and large.   Patient agitation and frequent movement made the procedure technically difficult.  Conclusion: Severe native three-vessel coronary artery disease including CTO of the mid LAD, CTO of the mid RCA, and patent stent in the mid left circumflex. Patent SVG to RCA with 2 patent stents in the proximal and distal segments. Patent LIMA to LAD RA 6, RV 38/5, PA 37/16 (25), PCWP 15 Fick CO equals 3.91, Fick CI 1.93  Was in the ED 02/23/21 due to shortness of breath and chest pain. Elevated troponin thought to be due to demand ischemia. Treated with solumedrol, albuterol and ativan. VBG consistent with hyperventilation, likely anxiety related. Admitted 02/08/21 due to shortness of breath. Initially given IV lasix/ solumedrol. Stool hemoccult was positive. Cardiology, GI, psychiatry and palliative care consults obtained. Cath and TEE completed. Medications transition to oral meds. Discharged after 5 days.   She presents today for her initial visit with a chief complaint of   Review of Systems    Physical Exam    Assessment & Plan:  1: Chronic heart failure with preserved ejection fraction with  structural changes (LVH)- - NYHA class - saw cardiology Nehemiah Massed) 12/08/20 - BNP 02/07/21 was 423.0  2: HTN- - BP - saw PCP(Miller) 01/26/21 - BMP 02/23/21 reviewed and showed sodium 135, potassium 4.5, creatinine 1.05 and GFR 55  3: DM- - saw endocrinologist (Solum) 12/20/20 - A1c 02/08/21 was 8.1%  4: COPD-  5: Anxiety-  6: Aortic stenosis- - has repeat echo scheduled for 03/01/21 - sees cardiology Angelena Form) 03/06/21

## 2021-02-28 ENCOUNTER — Ambulatory Visit: Payer: Medicare HMO | Admitting: Family

## 2021-03-01 ENCOUNTER — Ambulatory Visit (HOSPITAL_COMMUNITY): Payer: Medicare HMO | Attending: Cardiology

## 2021-03-01 ENCOUNTER — Other Ambulatory Visit: Payer: Self-pay

## 2021-03-01 ENCOUNTER — Encounter (HOSPITAL_COMMUNITY): Payer: Self-pay

## 2021-03-01 DIAGNOSIS — Z953 Presence of xenogenic heart valve: Secondary | ICD-10-CM | POA: Insufficient documentation

## 2021-03-01 DIAGNOSIS — I359 Nonrheumatic aortic valve disorder, unspecified: Secondary | ICD-10-CM | POA: Diagnosis not present

## 2021-03-01 LAB — ECHOCARDIOGRAM COMPLETE
AV Mean grad: 41.3 mmHg
AV Peak grad: 70.4 mmHg
Ao pk vel: 4.2 m/s
Area-P 1/2: 3.03 cm2
S' Lateral: 2.7 cm

## 2021-03-01 NOTE — Progress Notes (Signed)
Urgent findings reported to Dr. Gasper Sells Urgent findings reported time 11:00 Urgent findings acknowledge Yes Disposition of patient at discharge (if patient is discharged) Patient discharged

## 2021-03-05 NOTE — Progress Notes (Signed)
Chief Complaint  Patient presents with   New Patient (Initial Visit)    Severe aortic stenosis   History of Present Illness: 75 yo female with history of anemia, HTN, HLD, DM2, severe COPD on home O2, CAD s/p prior stent placements followed by CABG and AVR May 2015 and recent PCI who is here as a new patient in the multi-disciplinary valve clinic for discussion and review of her bioprosthetic aortic valve replacement failure. She is referred by Dr. Nehemiah Massed. I followed her in my office until 2019. She has had multiple hospital admissions since then and has had most of her care while an inpatient at [facility]. She most recently has established in the [facility] Cardiology office and has been seen by Dr. Erin Fulling. She is known to have CAD. She had a DES placed in the RCA and mid Circumflex in May 2009. Cardiac cath in 2015 with severe three vessel CAD. She underwent CABG and AVR with a bioprosthetic AVR with Dr. Cyndia Bent. (LIMA to LAD, SVG to RCA and a 21 mm [employer]). Echo June 2015 with normal LVEF and normal AVR. She was admitted to [facility] December 2017 with unstable angina. Cardiac cath with patent LIMA to LAD and SVG to RCA but progression of stent restenosis in the proximal circumflex which was treated with cutting balloon angioplasty. I saw her in the office in April 2019 and she was feeling terrible with many complaints including chest pressure, dyspnea, dizziness, fevers and fatigue. She was admitted from the office. Cardiac cath on 09/20/17 with patent LIMA to LAD and patent SVG to RCA with patent Circumflex stent with mild restenosis. Echo 09/20/17 with GGEZ=66-29%, grade 2 diastolic dysfunction. Her aortic valve bioprosthesis was working well. She was recently admitted to [facility] with unstable angina in June 2022 and had stenting of the Circumflex artery and the SVG to the RCA. Repeat cardiac cath 02/10/21 with stable CAD. There is CTO of the mid LAD, CTO of the mid RCA and patent Circumflex stents with  moderately severe distal AV groove stenosis with both grafts patent. Echo 912/22 with LVEF=50-55%, severe LVH. Normal RV size and function. Mild mitral regurgitation. The bioprosthetic aortic valve replacement is poorly visualized but appears calcified and stenotic. She was seen in the ED 02/23/21 by Dr. Audie Box and he felt that her bioprosthetic AVR was likely stenotic and contributing to her her dyspnea. She also has severe COPD and is now on continuous supplemental oxygen therapy. She is a former heavy smoker. Echo here at [facility] 03/01/21 with LVEF=60-65%, severe LVH. Normal RV size and function. Severe MAC with trivial MR. The 21 mm bioprosthetic AVR has severe stenosis with mean gradient 41 mmHg, peak gradient 70 mmHg.   She tells me today that she continues to have severe dyspnea. She is mostly limited to her chair at home. She wears supplemental oxygen therapy all day and night. She has rare chest pains. No LE edema. No dizziness or syncope. She full dentures. She lives in [place], [place] with her husband.  She is retired. Her husband reports that she has progressively worsening memory issues.   Primary Care Physician: Rusty Aus, MD Primary Cardiologist:Dr. Nehemiah Massed  Past Medical History:  Diagnosis Date   1st degree AV block    ACE-inhibitor cough    Allergic rhinitis    Anemia    iron deficiency anemia   Anxiety    Aortic ectasia ([place])    a. CT abd in 12/2016 incidentally noted aortic atherosclerosis and  infrarenal abdominal aortic ectasia measuring as large as 2.7 cm with recommendation to repeat US in 2023.   Arthritis    Asthma    Cataract    Chronic depression    Chronic diastolic CHF (congestive heart failure) (HCC)    Chronic headache    COPD (chronic obstructive pulmonary disease) (HCC)    Coronary artery disease    a. DES to RCA and mid Cx 2009. b. CABG and bioprosthetic AVR May 2015. c. cutting balloon to prox Cx in 05/2016   Diabetes mellitus    type 2   Diverticulitis of  colon    Essential hypertension    GERD (gastroesophageal reflux disease)    Hearing loss    History of blood transfusion 2013   History of prosthetic aortic valve replacement    HOH (hard of hearing)    Hypercholesterolemia    intolerance of statins and niaspan   IDA (iron deficiency anemia) 02/03/2019   Mobitz type 1 second degree AV block    OSA (obstructive sleep apnea)    mild, intolerant of cpap   PAD (peripheral artery disease) ([place])    a. atherosclerosis by CT abd 12/2016 in LE.   PONV (postoperative nausea and vomiting)    Statin intolerance    Thyroid disease     Past Surgical History:  Procedure Laterality Date   ABDOMINAL HYSTERECTOMY     ABDOMINAL HYSTERECTOMY W/ PARTIAL VAGINACTOMY     AORTIC VALVE REPLACEMENT N/A 10/12/2013   Procedure: AORTIC VALVE REPLACEMENT (AVR);  Surgeon: [name], MD;  Location: [place];  Service: Open Heart Surgery;  Laterality: N/A;   APPENDECTOMY  1964   BARTHOLIN GLAND CYST EXCISION     BLADDER SUSPENSION     BREAST BIOPSY Bilateral 09/11/2000   neg   BREAST BIOPSY Left 07/24/2010   neg   BREAST CYST EXCISION  1988   bilateral nonmalignant tumors, x3   CARDIAC CATHETERIZATION     CARDIAC CATHETERIZATION N/A 05/25/2016   Procedure: Coronary Balloon Angioplasty;  Surgeon: Leonie Man, MD;  Location: [place] [facility];  Service: Cardiovascular;  Laterality: N/A;   CARDIAC CATHETERIZATION N/A 05/25/2016   Procedure: Coronary/Graft Angiography;  Surgeon: Leonie Man, MD;  Location: [place] [facility];  Service: Cardiovascular;  Laterality: N/A;   CATARACT EXTRACTION W/ INTRAOCULAR LENS  IMPLANT, BILATERAL     CHOLECYSTECTOMY  2001   COLECTOMY     lap sigmoid   COLONOSCOPY  2014   polyps found, 2 clamped off.   CORONARY ANGIOPLASTY  10/29/2007   Prox RCA & Mid Cx.   CORONARY ARTERY BYPASS GRAFT N/A 10/12/2013   Procedure: CORONARY ARTERY BYPASS GRAFT TIMES TWO;  Surgeon: [name], MD;  Location: [place] OR;  Service: Open  Heart Surgery;  Laterality: N/A;   CORONARY/GRAFT ANGIOGRAPHY N/A 09/20/2017   Procedure: CORONARY/GRAFT ANGIOGRAPHY;  Surgeon: Sherren Mocha, MD;  Location: [place] [facility];  Service: Cardiovascular;  Laterality: N/A;   LEFT HEART CATHETERIZATION WITH CORONARY ANGIOGRAM N/A 10/09/2013   Procedure: LEFT HEART CATHETERIZATION WITH CORONARY ANGIOGRAM;  Surgeon: [name], MD;  Location: [facility] CATH LAB;  Service: Cardiovascular;  Laterality: N/A;   RIGHT/LEFT HEART CATH AND CORONARY ANGIOGRAPHY N/A 11/03/2020   Procedure: RIGHT/LEFT HEART CATH AND CORONARY ANGIOGRAPHY;  Surgeon: Corey Skains, MD;  Location: [place] [facility];  Service: Cardiovascular;  Laterality: N/A;   RIGHT/LEFT HEART CATH AND CORONARY/GRAFT ANGIOGRAPHY N/A 02/10/2021   Procedure: RIGHT/LEFT HEART CATH AND CORONARY/GRAFT  ANGIOGRAPHY;  Surgeon: Andrez Grime, MD;  Location: [place] [facility];  Service: Cardiovascular;  Laterality: N/A;   STERNAL WIRES REMOVAL N/A 04/13/2014   Procedure: STERNAL WIRES REMOVAL;  Surgeon: [name], MD;  Location: [place];  Service: Thoracic;  Laterality: N/A;   TEE WITHOUT CARDIOVERSION N/A 11/03/2020   Procedure: TRANSESOPHAGEAL ECHOCARDIOGRAM (TEE);  Surgeon: Corey Skains, MD;  Location: [facility];  Service: Cardiovascular;  Laterality: N/A;   TEE WITHOUT CARDIOVERSION N/A 02/13/2021   Procedure: TRANSESOPHAGEAL ECHOCARDIOGRAM (TEE);  Surgeon: Corey Skains, MD;  Location: [facility];  Service: Cardiovascular;  Laterality: N/A;   THYROIDECTOMY     TONSILLECTOMY     TUBAL LIGATION     VAGINAL DELIVERY     3   VISCERAL ARTERY INTERVENTION N/A 08/16/2016   Procedure: Visceral Artery Intervention;  Surgeon: Algernon Huxley, MD;  Location: [place] [facility];  Service: Cardiovascular;  Laterality: N/A;    Current Outpatient Medications  Medication Sig Dispense Refill   acetaminophen (TYLENOL) 500 MG tablet Take 500-1,000 mg by mouth every 6 (six) hours as needed for  mild pain.     albuterol (PROVENTIL) (2.5 MG/3ML) 0.083% nebulizer solution Take 2.5 mg by nebulization 3 (three) times daily.     ANORO ELLIPTA 62.5-25 MCG/INH AEPB Inhale 1 puff into the lungs daily.     aspirin EC 81 MG EC tablet Take 1 tablet (81 mg total) by mouth daily. Swallow whole. 30 tablet 11   budesonide (PULMICORT) 1 MG/2ML nebulizer solution Inhale 2 mLs into the lungs at bedtime.     clopidogrel (PLAVIX) 75 MG tablet Take 75 mg by mouth daily.     DULoxetine (CYMBALTA) 20 MG capsule Take 1 capsule (20 mg total) by mouth in the morning.     FLUoxetine (PROZAC) 40 MG capsule Take 40 mg by mouth 2 (two) times daily.     Insulin Glargine (BASAGLAR KWIKPEN) 100 UNIT/ML Inject 100 Units into the skin at bedtime.     insulin lispro (HUMALOG) 100 UNIT/ML injection Inject 0-54 Units into the skin 3 (three) times daily before meals.     isosorbide mononitrate (IMDUR) 30 MG 24 hr tablet Take 1 tablet (30 mg total) by mouth daily. 30 tablet 11   levothyroxine (SYNTHROID, LEVOTHROID) 125 MCG tablet Take 125 mcg by mouth daily before breakfast.     losartan (COZAAR) 25 MG tablet Take 1 tablet (25 mg total) by mouth in the morning.     nitroGLYCERIN (NITROSTAT) 0.4 MG SL tablet Place 1 tablet (0.4 mg total) under the tongue every 5 (five) minutes as needed for chest pain. 90 tablet 3   ondansetron (ZOFRAN-ODT) 4 MG disintegrating tablet Take 4 mg by mouth 3 (three) times daily as needed for nausea or vomiting.     pantoprazole (PROTONIX) 40 MG tablet Take 40 mg by mouth in the morning.     torsemide (DEMADEX) 20 MG tablet Take 20 mg by mouth daily.     traZODone (DESYREL) 100 MG tablet Take 200 mg by mouth at bedtime.     guaiFENesin (MUCINEX) 600 MG 12 hr tablet Take 2 tablets (1,200 mg total) by mouth 2 (two) times daily. (Patient not taking: No sig reported) 30 tablet 0   methylPREDNISolone (MEDROL DOSEPAK) 4 MG TBPK tablet Take one tablet (4 mg) at bedtime and take one tablet in the morning.  (Patient not taking: Reported on 03/06/2021) 2 tablet 0   No current facility-administered medications for this visit.  Allergies  Allergen Reactions   Amitriptyline Other (See Comments)    Unknown reaction   Benadryl [Diphenhydramine] Shortness Of Breath   Demerol [Meperidine] Other (See Comments)    Unknown reaction   Gabapentin Other (See Comments)    Unknown reaction   Mirtazapine Other (See Comments)    Unknown reaction   Olanzapine Other (See Comments)    Unknown reaction    Voltaren [Diclofenac Sodium] Shortness Of Breath   Zetia [Ezetimibe] Other (See Comments)    Weakness in legs, shakiness all over   Ativan [Lorazepam] Other (See Comments)    Causes double vision at highter than .5 mg dose   Atorvastatin Other (See Comments)    Muscle aches and weakness   Budesonide-Formoterol Fumarate Other (See Comments)    Shakiness, tremors   Bupropion Hcl Other (See Comments)    "cloud over me" depression   Caffeine Other (See Comments)    jitters   Codeine Sulfate Other (See Comments)    Makes chest hurt like a heart attack   Lisinopril Cough   Metformin Nausea And Vomiting   Mometasone Furoate Nausea And Vomiting   Morphine Sulfate Other (See Comments)    Chest pain like a heart attack   Other Other (See Comments)    Beta Blockers, reaction shortness of breath   Oxycodone-Acetaminophen Nausea And Vomiting   Pioglitazone Other (See Comments)    Cannot take because of risk of bladder cancer   Propoxyphene N-Acetaminophen Nausea And Vomiting   Rosuvastatin Other (See Comments)    Muscle aches and weakness   Shellfish Allergy Diarrhea   Suvorexant Other (See Comments)    Jerking/nervous    Ticagrelor     Other reaction(s): Other (See Comments) "slowed heart rate" & chest pain   Tramadol Nausea Only   Venlafaxine Other (See Comments)    Unknown reaction   Xanax [Alprazolam] Other (See Comments)    Mental status changes, "didn't know where she was at" per husband  at bedside   Zolpidem Tartrate Other (See Comments)     Jittery, diarrhea   Latex Rash    Social History   Socioeconomic History   Marital status: Married    Spouse name: Not on file   Number of children: 3   Years of education: Not on file   Highest education level: Not on file  Occupational History   Occupation: Retired    Fish farm manager: UNEMPLOYED    Comment: CNA  Tobacco Use   Smoking status: Former    Packs/day: 0.50    Years: 30.00    Pack years: 15.00    Types: Cigarettes    Quit date: 10/02/2013    Years since quitting: 7.4   Smokeless tobacco: Never  Vaping Use   Vaping Use: Never used  Substance and Sexual Activity   Alcohol use: No   Drug use: No   Sexual activity: Not Currently  Other Topics Concern   Not on file  Social History Narrative   Does not have Living Will   Desires CPR, would not want prolonged life support if futile.   Social Determinants of Health   Financial Resource Strain: Not on file  Food Insecurity: Not on file  Transportation Needs: Not on file  Physical Activity: Not on file  Stress: Not on file  Social Connections: Not on file  Intimate Partner Violence: Not on file    Family History  Problem Relation Age of Onset   Breast cancer Mother 66   Hypertension Father  Mesothelioma Father    Asthma Father    Stroke Paternal Grandfather    Heart disease Other    Breast cancer Maternal Aunt    Breast cancer Paternal Aunt     Review of Systems:  As stated in the HPI and otherwise negative.   BP (!) 110/58   Pulse 84   Wt 200 lb (90.7 kg)   BMI 32.28 kg/m   Physical Examination:  General: Well developed, well nourished, NAD  HEENT: OP clear, mucus membranes moist  SKIN: warm, dry. No rashes. Neuro: No focal deficits  Musculoskeletal: Muscle strength 5/5 all ext  Psychiatric: Mood and affect normal  Neck: No JVD, no carotid bruits, no thyromegaly, no lymphadenopathy.  Lungs:Poor air movement bilaterally. No crackles.   Cardiovascular: Regular rate and rhythm. Loud, harsh systolic murmur.  Abdomen:Soft. Bowel sounds present. Non-tender.  Extremities: No lower extremity edema. Pulses are 2 + in the bilateral DP/PT.  Echo September 2022:  1. S/P AVR not well visualized; leaflets appear restricted; severe AS with mean gradient 41 mmHg; severe MAC with mild MS.  2. Left ventricular ejection fraction, by estimation, is 60 to 65%. The left ventricle has normal function. The left ventricle has no regional wall motion abnormalities. There is severe left ventricular hypertrophy. Left ventricular diastolic parameters  are indeterminate. Elevated left atrial pressure.  3. Right ventricular systolic function is normal. The right ventricular size is normal.  4. Left atrial size was mildly dilated.  5. The mitral valve is normal in structure. Trivial mitral valve regurgitation. Mild mitral stenosis. Severe mitral annular calcification.  6. The aortic valve has been repaired/replaced. Aortic valve regurgitation is mild. Severe aortic valve stenosis. There is a 21 mm [place] Magna-Ease valve present in the aortic position. Procedure Date: 10/12/13.  7. Aortic dilatation noted. There is mild dilatation of the aortic root, measuring 40 mm. There is mild dilatation of the ascending aorta, measuring 41 mm.   Comparison(s): TTE 02/13/21 EF 50-55%. AV 20mHg mean PG, 348mG peak PG. TEE 11/03/20 EF 55-60%. AV 3289m mean PG, 28m24mpeak PG.   FINDINGS  Left Ventricle: Left ventricular ejection fraction, by estimation, is 60 to 65%. The left ventricle has normal function. The left ventricle has no regional wall motion abnormalities. The left ventricular internal cavity size was normal in size. There is  severe left ventricular hypertrophy. Left ventricular diastolic parameters are indeterminate. Elevated left atrial pressure.   Right Ventricle: The right ventricular size is normal. Right ventricular systolic function is normal.    Left Atrium: Left atrial size was mildly dilated.   Right Atrium: Right atrial size was normal in size.   Pericardium: There is no evidence of pericardial effusion.   Mitral Valve: The mitral valve is normal in structure. Severe mitral annular calcification. Trivial mitral valve regurgitation. Mild mitral valve stenosis. MV peak gradient, 8.9 mmHg. The mean mitral valve gradient is 4.0 mmHg.   Tricuspid Valve: The tricuspid valve is normal in structure. Tricuspid valve regurgitation is trivial. No evidence of tricuspid stenosis.   Aortic Valve: The aortic valve has been repaired/replaced. Aortic valve regurgitation is mild. Severe aortic stenosis is present. Aortic valve mean gradient measures 41.3 mmHg. Aortic valve peak gradient measures 70.4 mmHg. There is a 21 mm [place]  Magna-Ease valve present in the aortic position. Procedure Date: 10/12/13.   Pulmonic Valve: The pulmonic valve was normal in structure. Pulmonic valve regurgitation is trivial. No evidence of pulmonic stenosis.   Aorta: Aortic dilatation noted. There is  mild dilatation of the aortic root, measuring 40 mm. There is mild dilatation of the ascending aorta, measuring 41 mm.   Venous: The inferior vena cava was not well visualized.   IAS/Shunts: The interatrial septum was not well visualized.   Additional Comments: S/P AVR not well visualized; leaflets appear restricted; severe AS with mean gradient 41 mmHg; severe MAC with mild MS.     LEFT VENTRICLE PLAX 2D LVIDd:         3.80 cm Diastology LVIDs:         2.70 cm LV e' medial:    5.25 cm/s LV PW:         1.70 cm LV E/e' medial:  28.0 LV IVS:        1.70 cm LV e' lateral:   6.45 cm/s                        LV E/e' lateral: 22.8     RIGHT VENTRICLE RV Basal diam:  3.10 cm RV S prime:     6.60 cm/s   LEFT ATRIUM             Index       RIGHT ATRIUM           Index LA diam:        4.70 cm 2.35 cm/m  RA Pressure: 3.00 mmHg LA Vol (A2C):   67.3 ml 33.65 ml/m RA  Area:     10.60 cm LA Vol (A4C):   62.1 ml 31.05 ml/m RA Volume:   21.50 ml  10.75 ml/m LA Biplane Vol: 65.2 ml 32.60 ml/m  AORTIC VALVE AV Vmax:           419.67 cm/s AV Vmean:          292.750 cm/s AV VTI:            0.782 m AV Peak Grad:      70.4 mmHg AV Mean Grad:      41.3 mmHg LVOT Vmax:         135.00 cm/s LVOT Vmean:        91.200 cm/s LVOT VTI:          0.264 m LVOT/AV VTI ratio: 0.34   AORTA Ao Root diam: 4.00 cm Ao Asc diam:  4.10 cm   MITRAL VALVE                TRICUSPID VALVE                             Estimated RAP:  3.00 mmHg MV Peak grad:  8.9 mmHg MV Mean grad:  4.0 mmHg     SHUNTS MV Vmax:       1.49 m/s     Systemic VTI: 0.26 m MV Vmean:      97.9 cm/s MV Decel Time: 250 msec MV E velocity: 147.00 cm/s MV A velocity: 175.00 cm/s MV E/A ratio:  0.84  Cardiac cath September 2022:  Mid LAD lesion is 100% stenosed.   Ost RCA lesion is 99% stenosed.   Ost RCA to Prox RCA lesion is 20% stenosed.   Prox RCA lesion is 80% stenosed.   Mid RCA lesion is 100% stenosed.   Prox Cx to Mid Cx lesion is 50% stenosed.   Previously placed Origin to Prox Graft stent (unknown type) is widely patent.   Previously placed Dist Graft to  Insertion stent (unknown type) is widely patent.   Previously placed Prox Cx stent (unknown type) is widely patent.   SVG and is large.   LIMA and is normal in caliber and large.  Patient agitation and frequent movement made the procedure technically difficult.  Conclusion:  Severe native three-vessel coronary artery disease including CTO of the mid LAD, CTO of the mid RCA, and patent stent in the mid left circumflex. Patent SVG to RCA with 2 patent stents in the proximal and distal segments. Patent LIMA to LAD RA 6, RV 38/5, PA 37/16 (25), PCWP 15 Fick CO equals 3.91, Fick CI 1.93 Recommendation  Unable to cross the aortic valve during this procedure. She needs additional evaluation to determine the severity of her aortic valve  stenosis. Likely we will perform a TEE next week. She has been appropriately diuresed with a RA pressure of 6 and pulmonary capillary wedge pressure 15. I have ordered her home torsemide of 20 mg daily to be started tomorrow. Continue aspirin and Plavix as previously prescribed for stent placed in June 2022 Indications NSTEMI (non-ST elevated myocardial infarction) ([place]) [I21.4 (ICD-10-CM)]  Procedural Details Technical Details Cardiac Catheterization Procedure Note   Procedural details: The right groin was prepped, draped, and anesthetized with 1% lidocaine. Under ultrasound guidance and using modified Seldinger technique, a 5 French sheath was introduced into the right femoral artery. Standard Judkins catheters were used for coronary angiography (JR4 and JL5). SVG to RCA engaged using JR4. LIMA engaged using IMA. Catheter exchanges were performed over a guidewire.   Right heart catheterization was performed by placing a 49F sheath in right femoral vein using the modified Seldinger technique under ultrasound guidance. A balloontipped catheter was then advanced through the right heart chambers and pressures were obtained. A pulmonary artery saturation was obtained. At the completion of the procedure the balloontipped catheter and the sheath were removed and manual pressure was held to achieve hemostasis.  There were no immediate procedural complications. The patient was transferred to the post catheterization recovery area for further monitoring.    Estimated blood loss <50 mL.   During this procedure medications were administered to achieve and maintain moderate conscious sedation while the patient's heart rate, blood pressure, and oxygen saturation were continuously monitored and I was present face-to-face 100% of this time.  Medications (Filter: Administrations occurring from 1441 to 1609 on 02/10/21)  important Continuous medications are totaled by the amount administered until 02/10/21 1609.   midazolam (VERSED) injection (mg) Total dose: 1 mg  Date/Time Rate/Dose/Volume Action    02/10/21 1454 0.5 mg Given   1456 0.5 mg Given   fentaNYL (SUBLIMAZE) injection (mcg) Total dose: 50 mcg  Date/Time Rate/Dose/Volume Action    02/10/21 1454 25 mcg Given   1456 25 mcg Given   Heparin (Porcine) in NaCl 1000-0.9 UT/500ML-% SOLN (mL) Total volume: 1,000 mL  Date/Time Rate/Dose/Volume Action    02/10/21 1455 1,000 mL Given   lidocaine (PF) (XYLOCAINE) 1 % injection (mL) Total volume: 10 mL  Date/Time Rate/Dose/Volume Action    02/10/21 1455 10 mL Given   iohexol (OMNIPAQUE) 350 MG/ML injection (mL) Total volume: 100 mL  Date/Time Rate/Dose/Volume Action    02/10/21 1557 100 mL Given   furosemide (LASIX) tablet 40 mg (mg) Total dose: Cannot be calculated* Dosing weight: 97.1  *Administration dose not documented  Date/Time Rate/Dose/Volume Action    02/10/21 1552 *Not included in total MAR Unhold   Sedation Time Sedation Time Physician-1: 49 minutes 52 seconds  Contrast Medication Name Total Dose  iohexol (OMNIPAQUE) 350 MG/ML injection 100 mL  Radiation/Fluoro Fluoro time: 21.3 (min)  DAP: 57.2 (Gycm2)  Cumulative Air Kerma: 578 (mGy)  Complications Complications documented before study signed (02/10/2021 4:69 PM)  No complications were associated with this study.  Documented by Andrez Grime, MD - 02/10/2021 4:17 PM  Coronary Findings Diagnostic Dominance: Right  Left Main  Vessel is large.   Left Anterior Descending  Mid LAD lesion is 100% stenosed. The lesion is chronically occluded.   First Diagonal Branch  Vessel is moderate in size. The vessel exhibits minimal luminal irregularities.   Lateral First Diagonal Branch  Vessel is small in size.   First Septal Branch  Vessel is moderate in size.   Second Septal Branch  Vessel is small in size.   Third Diagonal Branch  Vessel is small in size.   Third Septal Branch  Vessel is small in size.    Left Circumflex  The circumflex is patent. The stented segment previously treated with cutting balloon angioplasty demonstrates mild-moderate ISR estimated at 50%  Previously placed Prox Cx stent (unknown type) is widely patent.  Prox Cx to Mid Cx lesion is 50% stenosed.   Third Obtuse Marginal Branch  Vessel is moderate in size. Vessel is angiographically normal.   Right Coronary Artery  Vessel is large. The RCA is not selectively injected. The SVG-distal RCA is widely patent and the distal RCA filling in retrograde fashion demonstrated 95% stenosis proximal to the graft insertion site. Beyond the graft insertion site the RCA and it's branches are patent.  Ost RCA lesion is 99% stenosed. The lesion is focal.  Ost RCA to Prox RCA lesion is 20% stenosed. The lesion was previously treated using a stent (unknown type) over 2 years ago.  Prox RCA lesion is 80% stenosed.  Mid RCA lesion is 100% stenosed. The lesion is chronically occluded.   Right Posterior Descending Artery  Vessel is large in size. Vessel is angiographically normal.   Inferior Septal  Vessel is small in size.   Right Posterior Atrioventricular Artery  Vessel is angiographically normal.   First Right Posterolateral Branch  Vessel is small in size.   Second Right Posterolateral Branch  Vessel is small in size.   Third Right Posterolateral Branch  Vessel is small in size.   Saphenous Graft To Dist RCA  SVG and is large.  Previously placed Origin to Prox Graft stent (unknown type) is widely patent.  Previously placed Dist Graft to Insertion stent (unknown type) is widely patent.   LIMA LIMA Graft To Dist LAD  LIMA and is normal in caliber and large. Grade fills the major second diagonal branch. The graft is widely patent, fills the large diagonal in retrograde fashion, and the distal LAD beyond the graft insertion is patent to the apex.  Intervention No interventions have been documented.  Coronary  Diagrams Diagnostic Dominance: Right  &&&&&&  Intervention Implants  Vascular Products  Device Closure Mynxgrip 81f-- [id number] Implanted  Inventory item: DEVICE CLOSURE MYNXGRIP 44F Model/Cat number: [id number] Manufacturer: [employer] Lot number: [id number] Device identifier: [id number] identifier type: GS1  Area Of Implantation: Groin    GUDID Information   Request status Successful     Brand name: MBeaver Dam Com HsptlVersion/Model: [id number]  Company name: [employer] MRI safety info as of 02/10/21: MR Safe   Contains dry or latex rubber: No     GMDN P.T. name: Wound hydrogel dressing,  non-antimicrobial     As of 02/10/2021    Status: Implanted       Syngo Images Show images for CARDIAC CATHETERIZATION  Images on Long Term Storage Show images for Ival, Basquez to Procedure Log   Procedure Log  Hemo Data (last 6 days) before discharge AO Systolic Cath Pressure AO Diastolic Cath Pressure AO Mean Cath Pressure PA Systolic Cath Pressure PA Diastolic Cath Pressure PA Mean Cath Pressure RA Wedge A Wave RA Wedge V Wave RV Systolic Cath Pressure RV Diastolic Cath Pressure RV End Diastolic RV Systolic RV End Diastolic RV dP/dt PCW A Wave PCW V Wave PCW Mean AO O2 Sat PA O2 Sat AO O2 Sat Fick C.O. Fick C.I.  -- -- -- -- -- -- 6 mmHg 6 mmHg -- -- -- 38 mmHg 5 mmHg 480 mmHg/sec 18 mmHg 15 mmHg 15 mmHg -- -- -- 3.91 L/min 1.93 L/min/m2  -- -- -- -- -- -- -- -- 38 mmHg 1 mmHg 5 mmHg -- -- -- -- -- -- -- -- -- -- --  -- -- -- 37 mmHg 16 mmHg 25 mmHg -- -- -- -- -- -- -- -- -- -- -- -- -- -- -- --  -- -- -- -- -- -- -- -- -- -- -- -- -- -- -- -- -- 97.9 % -- SA -- --  -- -- -- -- -- -- -- -- -- -- -- -- -- -- -- -- -- -- MV -- -- --  154 77 mmHg 103 mmHg -- -- -- -- -- -- -- -- -- -- -- --             EKG:  EKG is not ordered today. The ekg ordered today demonstrates   Recent Labs: 02/01/2021: TSH 7.959 02/02/2021: ALT 30 02/07/2021: B Natriuretic Peptide 423.0 02/13/2021:  Magnesium 2.1 02/23/2021: BUN 12; Creatinine, Ser 1.05; Hemoglobin 9.2; Platelets 188; Potassium 3.8; Sodium 137    Wt Readings from Last 3 Encounters:  03/06/21 200 lb (90.7 kg)  02/12/21 200 lb (90.7 kg)  02/01/21 203 lb (92.1 kg)     Other studies Reviewed: Additional studies/ records that were reviewed today include: . Review of the above records demonstrates:    STS Risk Score: Risk of Mortality: 9.704% Renal Failure: 7.868% Permanent Stroke: 3.749% Prolonged Ventilation: 23.484% DSW Infection: 0.210% Reoperation: 5.389% Morbidity or Mortality: 28.790% Short Length of Stay: 14.241% Long Length of Stay: 11.678%  Assessment and Plan:   Severe aortic stenosis/bioprosthetic AVR failure: She has stage D severe aortic valve stenosis with failure of her bioprosthetic AVR replacement. There appears to be severe stenosis visually with poor leaflet motion. Mean gradient 41 mmHg.I have personally reviewed the echo images.  I think she would benefit from AVR. Given advanced age, severe COPD and prior open heart surgery, she is not a good candidate for conventional AVR by surgical approach. She is a marginal candidate for TAVR given her comorbid conditions including advanced COPD and mild dementia. I think she may be a candidate for valve in valve TAVR although she is borderline and this will depend on further testing. She understands that if we decided not to do this, she would need palliative care planning. She is significantly more deconditioned overall than when I saw her several years ago.   I have reviewed the natural history of aortic stenosis with the patient and their family members  who are present today. We have discussed the limitations of medical therapy and the poor  prognosis associated with symptomatic aortic stenosis. We have reviewed potential treatment options, including palliative medical therapy, conventional surgical aortic valve replacement, and transcatheter aortic  valve replacement. We discussed treatment options in the context of the patient's specific comorbid medical conditions.   She would like to proceed with planning for TAVR. Risks and benefits of the valve procedure are reviewed with the patient. We will plan pre TAVR CT scans now as well as carotid artery dopplers, PT assessment. She will also be referred to see Dr. Cyndia Bent.   Current medicines are reviewed at length with the patient today.  The patient does not have concerns regarding medicines.  The following changes have been made:  no change  Labs/ tests ordered today include:   Orders Placed This Encounter  Procedures   Basic Metabolic Panel (BMET)     Disposition:   F/u with the valve team for planning as above  Signed, [name], MD 03/06/2021 10:03 AM    [address], [place], [place]  [address] Phone: [phone]; Fax: [phone]

## 2021-03-06 ENCOUNTER — Telehealth: Payer: Self-pay

## 2021-03-06 ENCOUNTER — Telehealth: Payer: Self-pay | Admitting: Cardiovascular Disease

## 2021-03-06 ENCOUNTER — Ambulatory Visit: Payer: Medicare HMO | Admitting: Cardiovascular Disease

## 2021-03-06 ENCOUNTER — Other Ambulatory Visit: Payer: Self-pay

## 2021-03-06 VITALS — BP 110/58 | HR 84 | Wt 200.0 lb

## 2021-03-06 DIAGNOSIS — Z01812 Encounter for preprocedural laboratory examination: Secondary | ICD-10-CM

## 2021-03-06 DIAGNOSIS — Z953 Presence of xenogenic heart valve: Secondary | ICD-10-CM

## 2021-03-06 DIAGNOSIS — I350 Nonrheumatic aortic (valve) stenosis: Secondary | ICD-10-CM

## 2021-03-06 LAB — BASIC METABOLIC PANEL
BUN/Creatinine Ratio: 11 — ABNORMAL LOW (ref 12–28)
BUN: 12 mg/dL (ref 8–27)
CO2: 24 mmol/L (ref 20–29)
Calcium: 8.1 mg/dL — ABNORMAL LOW (ref 8.7–10.3)
Chloride: 99 mmol/L (ref 96–106)
Creatinine, Ser: 1.08 mg/dL — ABNORMAL HIGH (ref 0.57–1.00)
Glucose: 208 mg/dL — ABNORMAL HIGH (ref 70–99)
Potassium: 4.3 mmol/L (ref 3.5–5.2)
Sodium: 135 mmol/L (ref 134–144)
eGFR: 53 mL/min/{1.73_m2} — ABNORMAL LOW (ref >59)

## 2021-03-06 MED ORDER — NITROGLYCERIN 0.4 MG SL SUBL
0.4000 mg | SUBLINGUAL_TABLET | SUBLINGUAL | 3 refills | Status: AC | PRN
Start: 2021-03-06 — End: ?

## 2021-03-06 NOTE — Patient Instructions (Signed)
Medication Instructions:  No changes today (NTG refilled)  *If you need a refill on your cardiac medications before your next appointment, please call your pharmacy*   Lab Work: BMET today   Testing/Procedures: CT scans to be arranged.  We will contact you with a date/time, instructions.   Follow-Up: Per Structural Heart Valve Team   Other Instructions

## 2021-03-06 NOTE — Telephone Encounter (Signed)
Called and spoke w patient's husband.  He is aware the upcoming appt with APP has been cancelled.

## 2021-03-06 NOTE — Telephone Encounter (Signed)
Husband of patient called. The husband wanted to know if the patient's appointment with Laurann Montana NP 03/17/21 is still needed. She just saw Dr. Angelena Form this morning.  Please advise

## 2021-03-06 NOTE — Telephone Encounter (Signed)
Called to discuss timing of TAVR testing. The patient's husbands says they "can make anything work." Will arrange testing and appointment with Dr. Cyndia Bent, send instructions through [place], and call tomorrow to discuss over the phone per request.  They were grateful for call and agree with plan.  10/14 visit with Laurann Montana cancelled.

## 2021-03-07 ENCOUNTER — Encounter (HOSPITAL_COMMUNITY): Payer: Self-pay

## 2021-03-07 ENCOUNTER — Emergency Department (HOSPITAL_COMMUNITY): Payer: Medicare HMO

## 2021-03-07 ENCOUNTER — Other Ambulatory Visit: Payer: Self-pay

## 2021-03-07 ENCOUNTER — Telehealth: Payer: Self-pay | Admitting: Cardiovascular Disease

## 2021-03-07 ENCOUNTER — Emergency Department (HOSPITAL_COMMUNITY)
Admission: EM | Admit: 2021-03-07 | Discharge: 2021-03-07 | Disposition: A | Discharge status: Home / Self Care | Payer: Medicare HMO | Attending: Emergency Medicine | Admitting: Emergency Medicine

## 2021-03-07 DIAGNOSIS — R079 Chest pain, unspecified: Secondary | ICD-10-CM | POA: Insufficient documentation

## 2021-03-07 DIAGNOSIS — R0602 Shortness of breath: Secondary | ICD-10-CM | POA: Insufficient documentation

## 2021-03-07 DIAGNOSIS — Z5321 Procedure and treatment not carried out due to patient leaving prior to being seen by health care provider: Secondary | ICD-10-CM | POA: Insufficient documentation

## 2021-03-07 LAB — CBC WITH DIFFERENTIAL/PLATELET
Abs Immature Granulocytes: 0.07 10*3/uL (ref 0.00–0.07)
Basophils Absolute: 0 10*3/uL (ref 0.0–0.1)
Basophils Relative: 0 %
Eosinophils Absolute: 0.3 10*3/uL (ref 0.0–0.5)
Eosinophils Relative: 3 %
HCT: 28.4 % — ABNORMAL LOW (ref 36.0–46.0)
Hemoglobin: 8.7 g/dL — ABNORMAL LOW (ref 12.0–15.0)
Immature Granulocytes: 1 %
Lymphocytes Relative: 20 %
Lymphs Abs: 1.9 10*3/uL (ref 0.7–4.0)
MCH: 27 pg (ref 26.0–34.0)
MCHC: 30.6 g/dL (ref 30.0–36.0)
MCV: 88.2 fL (ref 80.0–100.0)
Monocytes Absolute: 0.7 10*3/uL (ref 0.1–1.0)
Monocytes Relative: 8 %
Neutro Abs: 6.8 10*3/uL (ref 1.7–7.7)
Neutrophils Relative %: 68 %
Platelets: 219 10*3/uL (ref 150–400)
RBC: 3.22 MIL/uL — ABNORMAL LOW (ref 3.87–5.11)
RDW: 13.9 % (ref 11.5–15.5)
WBC: 9.8 10*3/uL (ref 4.0–10.5)
nRBC: 0 % (ref 0.0–0.2)

## 2021-03-07 LAB — BASIC METABOLIC PANEL
Anion gap: 10 (ref 5–15)
BUN: 12 mg/dL (ref 8–23)
CO2: 26 mmol/L (ref 22–32)
Calcium: 8.3 mg/dL — ABNORMAL LOW (ref 8.9–10.3)
Chloride: 98 mmol/L (ref 98–111)
Creatinine, Ser: 1.14 mg/dL — ABNORMAL HIGH (ref 0.44–1.00)
GFR, Estimated: 50 mL/min — ABNORMAL LOW (ref >60)
Glucose, Bld: 159 mg/dL — ABNORMAL HIGH (ref 70–99)
Potassium: 3.8 mmol/L (ref 3.5–5.1)
Sodium: 134 mmol/L — ABNORMAL LOW (ref 135–145)

## 2021-03-07 LAB — TROPONIN I (HIGH SENSITIVITY)
Troponin I (High Sensitivity): 23 ng/L — ABNORMAL HIGH (ref <18)
Troponin I (High Sensitivity): 23 ng/L — ABNORMAL HIGH (ref <18)

## 2021-03-07 MED ORDER — FLUOXETINE HCL 20 MG PO CAPS
40.0000 mg | ORAL_CAPSULE | Freq: Every day | ORAL | Status: DC
  Filled 2021-03-07: qty 2

## 2021-03-07 MED ORDER — HYDROXYZINE HCL 25 MG PO TABS
50.0000 mg | ORAL_TABLET | Freq: Once | ORAL | Status: AC
  Administered 2021-03-07: 50 mg via ORAL
  Filled 2021-03-07: qty 2

## 2021-03-07 NOTE — Telephone Encounter (Signed)
Pt c/o Shortness Of Breath: STAT if SOB developed within the last 24 hours or pt is noticeably SOB on the phone  1. Are you currently SOB (can you hear that pt is SOB on the phone)? Yes, patient is on the way to the ED now  2. How long have you been experiencing SOB? over a year  3. Are you SOB when sitting or when up moving around?   4. Are you currently experiencing any other symptoms? no   Patient's husband requesting someone help expedite the process of being admitted to the hospital. He states the patient is having another breathing episode and they are driving now to the ED. He states he does not want to have to wait 10 hrs. He states she has been having episodes for a year.

## 2021-03-07 NOTE — ED Notes (Signed)
Pt called for vitals. No response.  

## 2021-03-07 NOTE — ED Notes (Signed)
Unable to obtain EKG due to pt hyperventilating and moving aroungd in the w/c. P.A. made aware. This tech will try again once pt is more calm.

## 2021-03-07 NOTE — ED Triage Notes (Signed)
Pt reports chest pain and sob that has been going on for a while but got worse yesterday. Pt hyperventilating in triage. Normally wears 3L [place] at home, arrives without oxygen on but roo air saturations at 99%. Pt very anxious in triage.

## 2021-03-07 NOTE — ED Provider Notes (Signed)
Emergency Medicine Provider Triage Evaluation Note  [name] , a 75 y.o. female  was evaluated in triage.  Pt complains of chronic CP/sob that worsened yesterday. Hx of "bad heart valve."   Review of Systems  Positive: CP, SOB Negative: Fevers, chills  Physical Exam  BP 113/62   Pulse 90   Temp 98.5 F (36.9 C) (Oral)   Resp (!) 30   Ht 5\' 6"  (1.676 m)   Wt 90.7 kg   SpO2 100%   BMI 32.28 kg/m  Gen:   Awake, panicking in triage Resp:  hyperventilating MSK:   Moves extremities without difficulty  Other:    Medical Decision Making  Medically screening exam initiated at 1:40 PM.  Appropriate orders placed.  [name] was informed that the remainder of the evaluation will be completed by another provider, this initial triage assessment does not replace that evaluation, and the importance of remaining in the ED until their evaluation is complete.  Panicking and hyperventilating in triage. Difficult to assess, no ekg at the moment   [name], [place] A, PA-C 03/07/21 1343    Lorelle Gibbs, DO 03/07/21 1423

## 2021-03-10 MED ORDER — DIAZEPAM 2 MG PO TABS: 2.0000 mg | ORAL_TABLET | ORAL | 0 refills | Status: DC

## 2021-03-10 MED ORDER — IVABRADINE HCL 5 MG PO TABS: 10.0000 mg | ORAL_TABLET | Freq: Once | ORAL | 0 refills | Status: AC

## 2021-03-10 NOTE — Telephone Encounter (Signed)
Discussed with Nell Range. Will call in ivadradine 10 mg and Valium 2.5 mg (2 tablets) for the patient to take prior to CT scans.  Confirmed new instructions with the patient's son.  He was grateful for call and agrees with plan.

## 2021-03-13 LAB — BLOOD GAS, ARTERIAL
Acid-Base Excess: 4.6 mmol/L — ABNORMAL HIGH (ref 0.0–2.0)
Bicarbonate: 28.2 mmol/L — ABNORMAL HIGH (ref 20.0–28.0)
FIO2: 0.35
O2 Saturation: 99.6 %
Patient temperature: 37
pCO2 arterial: 37 mmHg (ref 32.0–48.0)
pH, Arterial: 7.49 — ABNORMAL HIGH (ref 7.350–7.450)
pO2, Arterial: 171 mmHg — ABNORMAL HIGH (ref 83.0–108.0)

## 2021-03-15 ENCOUNTER — Other Ambulatory Visit: Payer: Medicare HMO

## 2021-03-15 ENCOUNTER — Ambulatory Visit (HOSPITAL_COMMUNITY): Payer: Medicare HMO

## 2021-03-16 ENCOUNTER — Ambulatory Visit: Payer: Medicare HMO

## 2021-03-16 ENCOUNTER — Other Ambulatory Visit: Payer: Self-pay

## 2021-03-16 ENCOUNTER — Ambulatory Visit (HOSPITAL_COMMUNITY)
Admission: RE | Admit: 2021-03-16 | Discharge: 2021-03-16 | Disposition: A | Discharge status: Home / Self Care | Payer: Medicare HMO | Source: Ambulatory Visit | Attending: Cardiovascular Disease | Admitting: Cardiovascular Disease

## 2021-03-16 ENCOUNTER — Ambulatory Visit: Payer: Medicare HMO | Admitting: Nurse Practitioner

## 2021-03-16 DIAGNOSIS — I350 Nonrheumatic aortic (valve) stenosis: Secondary | ICD-10-CM

## 2021-03-16 MED ORDER — LORAZEPAM 2 MG/ML IJ SOLN: 0.2500 mg | Freq: Once | INTRAMUSCULAR | Status: AC

## 2021-03-16 MED ORDER — LORAZEPAM 2 MG/ML IJ SOLN
INTRAMUSCULAR | Status: AC
  Administered 2021-03-16: 0.25 mg via INTRAVENOUS
  Filled 2021-03-16: qty 1

## 2021-03-16 MED ORDER — IOHEXOL 350 MG/ML SOLN
100.0000 mL | Freq: Once | INTRAVENOUS | Status: AC | PRN
  Administered 2021-03-16: 100 mL via INTRAVENOUS

## 2021-03-16 NOTE — Progress Notes (Signed)
Pt here for pre-TAVR CTAs. Pt arrived to radiology nurses station recliner bay complaining of sob, states typically uses nasal canula oxygen but did not bring it with her. Pts vitals on the monitor were all within normal limits. Pt appears very anxious despite taking prescribed medications for CT prep (ivabradine and valium). Call placed to structural heart team to assess patient and offer recommendations.   Nell Range PA talked to patient and attempted to reassure her with calming voice that her vitals are okay and that the scan is needed to move forward with the TAVR workup. Pt verbalized understanding.  Pts husband to room and states she still has one of two valium tablets to take. KT offered patient to take that to help calm her. And also requested 0.25mg  IV ativan be given while the PO valium became effective.   Pt appeared more relaxed, breathing effort improved. Permission to move to the scanner was obtained.   Patient escorted to CT1 via wheelchair by this nurse, assisted to lying position on the scanner, and instructions for scan given by CT techs x2.   Pt completed scan without incident, pt reported she was thirsty, water provided.  She was escorted to [facility]	[place] entrance via wheelchair by this nurse and transported home with family  Marchia Bond RN Navigator Cardiac Imaging [facility] [phone] Office  [phone] Cell

## 2021-03-17 ENCOUNTER — Ambulatory Visit (HOSPITAL_BASED_OUTPATIENT_CLINIC_OR_DEPARTMENT_OTHER): Payer: Self-pay | Admitting: Family

## 2021-03-21 ENCOUNTER — Other Ambulatory Visit: Payer: Self-pay

## 2021-03-21 ENCOUNTER — Emergency Department: Payer: Medicare HMO

## 2021-03-21 ENCOUNTER — Emergency Department
Admission: EM | Admit: 2021-03-21 | Discharge: 2021-03-21 | Disposition: A | Discharge status: Home / Self Care | Payer: Medicare HMO | Attending: Emergency Medicine | Admitting: Emergency Medicine

## 2021-03-21 DIAGNOSIS — R079 Chest pain, unspecified: Secondary | ICD-10-CM | POA: Insufficient documentation

## 2021-03-21 DIAGNOSIS — Z5321 Procedure and treatment not carried out due to patient leaving prior to being seen by health care provider: Secondary | ICD-10-CM | POA: Diagnosis not present

## 2021-03-21 DIAGNOSIS — F410 Panic disorder [episodic paroxysmal anxiety] without agoraphobia: Secondary | ICD-10-CM | POA: Insufficient documentation

## 2021-03-21 LAB — CBC
HCT: 26.5 % — ABNORMAL LOW (ref 36.0–46.0)
Hemoglobin: 8.8 g/dL — ABNORMAL LOW (ref 12.0–15.0)
MCH: 28.2 pg (ref 26.0–34.0)
MCHC: 33.2 g/dL (ref 30.0–36.0)
MCV: 84.9 fL (ref 80.0–100.0)
Platelets: 216 10*3/uL (ref 150–400)
RBC: 3.12 MIL/uL — ABNORMAL LOW (ref 3.87–5.11)
RDW: 14.4 % (ref 11.5–15.5)
WBC: 10.7 10*3/uL — ABNORMAL HIGH (ref 4.0–10.5)
nRBC: 0 % (ref 0.0–0.2)

## 2021-03-21 LAB — BASIC METABOLIC PANEL
Anion gap: 10 (ref 5–15)
BUN: 20 mg/dL (ref 8–23)
CO2: 28 mmol/L (ref 22–32)
Calcium: 8.3 mg/dL — ABNORMAL LOW (ref 8.9–10.3)
Chloride: 93 mmol/L — ABNORMAL LOW (ref 98–111)
Creatinine, Ser: 1.21 mg/dL — ABNORMAL HIGH (ref 0.44–1.00)
GFR, Estimated: 46 mL/min — ABNORMAL LOW (ref >60)
Glucose, Bld: 214 mg/dL — ABNORMAL HIGH (ref 70–99)
Potassium: 3.7 mmol/L (ref 3.5–5.1)
Sodium: 131 mmol/L — ABNORMAL LOW (ref 135–145)

## 2021-03-21 LAB — TROPONIN I (HIGH SENSITIVITY): Troponin I (High Sensitivity): 20 ng/L — ABNORMAL HIGH (ref <18)

## 2021-03-21 NOTE — ED Triage Notes (Signed)
Pt to ED via POV with c/o chest pain, pt is hyper ventilating during triage. Her O2 is 99%. She reports that she does not have anxiety, but pt appears to be having a panic attack. Husband reports that she wears 3L  at home. She states that she has a bad heart valve that needs replaced and they have to see a cardiologist in the [place].

## 2021-04-01 ENCOUNTER — Emergency Department: Payer: Medicare HMO

## 2021-04-01 ENCOUNTER — Other Ambulatory Visit: Payer: Self-pay

## 2021-04-01 ENCOUNTER — Encounter: Payer: Self-pay | Admitting: Emergency Medicine

## 2021-04-01 ENCOUNTER — Emergency Department
Admission: EM | Admit: 2021-04-01 | Discharge: 2021-04-01 | Disposition: A | Discharge status: Home / Self Care | Payer: Medicare HMO | Attending: Emergency Medicine | Admitting: Emergency Medicine

## 2021-04-01 DIAGNOSIS — R0602 Shortness of breath: Secondary | ICD-10-CM | POA: Insufficient documentation

## 2021-04-01 DIAGNOSIS — Z20822 Contact with and (suspected) exposure to covid-19: Secondary | ICD-10-CM | POA: Diagnosis not present

## 2021-04-01 DIAGNOSIS — F419 Anxiety disorder, unspecified: Secondary | ICD-10-CM | POA: Insufficient documentation

## 2021-04-01 DIAGNOSIS — R072 Precordial pain: Secondary | ICD-10-CM | POA: Diagnosis not present

## 2021-04-01 DIAGNOSIS — Z5321 Procedure and treatment not carried out due to patient leaving prior to being seen by health care provider: Secondary | ICD-10-CM | POA: Diagnosis not present

## 2021-04-01 LAB — COMPREHENSIVE METABOLIC PANEL
ALT: 12 U/L (ref 0–44)
AST: 24 U/L (ref 15–41)
Albumin: 3.4 g/dL — ABNORMAL LOW (ref 3.5–5.0)
Alkaline Phosphatase: 79 U/L (ref 38–126)
Anion gap: 6 (ref 5–15)
BUN: 18 mg/dL (ref 8–23)
CO2: 25 mmol/L (ref 22–32)
Calcium: 8.7 mg/dL — ABNORMAL LOW (ref 8.9–10.3)
Chloride: 103 mmol/L (ref 98–111)
Creatinine, Ser: 1.02 mg/dL — ABNORMAL HIGH (ref 0.44–1.00)
GFR, Estimated: 57 mL/min — ABNORMAL LOW (ref >60)
Glucose, Bld: 174 mg/dL — ABNORMAL HIGH (ref 70–99)
Potassium: 4.7 mmol/L (ref 3.5–5.1)
Sodium: 134 mmol/L — ABNORMAL LOW (ref 135–145)
Total Bilirubin: 0.7 mg/dL (ref 0.3–1.2)
Total Protein: 6.8 g/dL (ref 6.5–8.1)

## 2021-04-01 LAB — CBC WITH DIFFERENTIAL/PLATELET
Abs Immature Granulocytes: 0.1 10*3/uL — ABNORMAL HIGH (ref 0.00–0.07)
Basophils Absolute: 0.1 10*3/uL (ref 0.0–0.1)
Basophils Relative: 1 %
Eosinophils Absolute: 0.4 10*3/uL (ref 0.0–0.5)
Eosinophils Relative: 4 %
HCT: 28.8 % — ABNORMAL LOW (ref 36.0–46.0)
Hemoglobin: 9.1 g/dL — ABNORMAL LOW (ref 12.0–15.0)
Immature Granulocytes: 1 %
Lymphocytes Relative: 16 %
Lymphs Abs: 1.4 10*3/uL (ref 0.7–4.0)
MCH: 28.3 pg (ref 26.0–34.0)
MCHC: 31.6 g/dL (ref 30.0–36.0)
MCV: 89.4 fL (ref 80.0–100.0)
Monocytes Absolute: 0.7 10*3/uL (ref 0.1–1.0)
Monocytes Relative: 7 %
Neutro Abs: 6.7 10*3/uL (ref 1.7–7.7)
Neutrophils Relative %: 71 %
Platelets: 176 10*3/uL (ref 150–400)
RBC: 3.22 MIL/uL — ABNORMAL LOW (ref 3.87–5.11)
RDW: 14.9 % (ref 11.5–15.5)
WBC: 9.3 10*3/uL (ref 4.0–10.5)
nRBC: 0 % (ref 0.0–0.2)

## 2021-04-01 LAB — RESP PANEL BY RT-PCR (FLU A&B, COVID) ARPGX2
Influenza A by PCR: NEGATIVE
Influenza B by PCR: NEGATIVE
SARS Coronavirus 2 by RT PCR: NEGATIVE

## 2021-04-01 LAB — BRAIN NATRIURETIC PEPTIDE: B Natriuretic Peptide: 391.3 pg/mL — ABNORMAL HIGH (ref 0.0–100.0)

## 2021-04-01 LAB — TROPONIN I (HIGH SENSITIVITY): Troponin I (High Sensitivity): 27 ng/L — ABNORMAL HIGH (ref <18)

## 2021-04-01 NOTE — ED Triage Notes (Addendum)
Pt to ED via POV with husband at side c/o sternal CP and SHOB. Pt is hyperventilating and minimally redirectable. Pt is 99% on 3LNC at baseline (94% on RA). Pt's husband st she is having an anxiety attack. Pt just seen on 03/21/21 for the same event. Pt repeating "I have a bad heart valve that needs to be replaced and that she can only see providers/cardiologists in the [place] [facility]. Pt in NAD at this time drinking water- despite requesting NPO status. Pt A&Ox4 but forgetful at time at baseline.   Husband states at last admission she was placed on xanax and was too groggy to function and the medication was stopped by pt/family. Husband st she had better results on ativan or valium

## 2021-04-01 NOTE — ED Provider Notes (Signed)
Emergency Medicine Provider Triage Evaluation Note  [name] , a 75 y.o. female  was evaluated in triage.  Pt complains of SOB. H/o COPD on 3L [place] O2.  Review of Systems  Positive: SOB Negative: N/V  Physical Exam  BP (!) 142/76 (BP Location: Left Arm)   Pulse 76   Temp 98.6 F (37 C) (Oral)   Resp (!) 26   Ht 5\' 6"  (1.676 m)   Wt 90.7 kg   SpO2 99%   BMI 32.28 kg/m  Gen:   Awake, mild distress   Resp:  Increased effort  MSK:   Moves extremities without difficulty  Other:  None  Medical Decision Making  Medically screening exam initiated at 1:22 AM.  Appropriate orders placed.  MERION CATON was informed that the remainder of the evaluation will be completed by another provider, this initial triage assessment does not replace that evaluation, and the importance of remaining in the ED until their evaluation is complete.  75y/o F here for SOB. Will obtain cardiac panel, cxr, Covid swab. Patient will be seen by provider once she receives a treatment room.   Paulette Blanch, MD 04/01/21 [phone]

## 2021-04-03 ENCOUNTER — Other Ambulatory Visit: Payer: Self-pay

## 2021-04-03 ENCOUNTER — Emergency Department
Admission: EM | Admit: 2021-04-03 | Discharge: 2021-04-03 | Disposition: A | Discharge status: Home / Self Care | Payer: Medicare HMO | Attending: Emergency Medicine | Admitting: Emergency Medicine

## 2021-04-03 ENCOUNTER — Emergency Department: Payer: Medicare HMO

## 2021-04-03 DIAGNOSIS — R079 Chest pain, unspecified: Secondary | ICD-10-CM | POA: Diagnosis not present

## 2021-04-03 DIAGNOSIS — R0602 Shortness of breath: Secondary | ICD-10-CM | POA: Insufficient documentation

## 2021-04-03 DIAGNOSIS — Z5321 Procedure and treatment not carried out due to patient leaving prior to being seen by health care provider: Secondary | ICD-10-CM | POA: Insufficient documentation

## 2021-04-03 LAB — CBC
HCT: 28.4 % — ABNORMAL LOW (ref 36.0–46.0)
Hemoglobin: 9.1 g/dL — ABNORMAL LOW (ref 12.0–15.0)
MCH: 27.9 pg (ref 26.0–34.0)
MCHC: 32 g/dL (ref 30.0–36.0)
MCV: 87.1 fL (ref 80.0–100.0)
Platelets: 181 10*3/uL (ref 150–400)
RBC: 3.26 MIL/uL — ABNORMAL LOW (ref 3.87–5.11)
RDW: 15 % (ref 11.5–15.5)
WBC: 8.5 10*3/uL (ref 4.0–10.5)
nRBC: 0 % (ref 0.0–0.2)

## 2021-04-03 LAB — BASIC METABOLIC PANEL
Anion gap: 9 (ref 5–15)
BUN: 18 mg/dL (ref 8–23)
CO2: 26 mmol/L (ref 22–32)
Calcium: 8.6 mg/dL — ABNORMAL LOW (ref 8.9–10.3)
Chloride: 98 mmol/L (ref 98–111)
Creatinine, Ser: 1.18 mg/dL — ABNORMAL HIGH (ref 0.44–1.00)
GFR, Estimated: 48 mL/min — ABNORMAL LOW (ref >60)
Glucose, Bld: 237 mg/dL — ABNORMAL HIGH (ref 70–99)
Potassium: 4.8 mmol/L (ref 3.5–5.1)
Sodium: 133 mmol/L — ABNORMAL LOW (ref 135–145)

## 2021-04-03 LAB — TROPONIN I (HIGH SENSITIVITY): Troponin I (High Sensitivity): 24 ng/L — ABNORMAL HIGH (ref <18)

## 2021-04-03 LAB — BRAIN NATRIURETIC PEPTIDE: B Natriuretic Peptide: 311.4 pg/mL — ABNORMAL HIGH (ref 0.0–100.0)

## 2021-04-03 NOTE — ED Triage Notes (Signed)
Pt complaining of mid chest pain and SOB. Rapid breathing but will slow down when spoken to. On 3L home o2.  Was 97% on room air.  Was seen for same thing on 04/01/21

## 2021-04-06 ENCOUNTER — Emergency Department
Admission: EM | Admit: 2021-04-06 | Discharge: 2021-04-06 | Disposition: A | Discharge status: Home / Self Care | Payer: Medicare HMO | Attending: Emergency Medicine | Admitting: Emergency Medicine

## 2021-04-06 ENCOUNTER — Emergency Department: Payer: Medicare HMO

## 2021-04-06 ENCOUNTER — Other Ambulatory Visit: Payer: Self-pay

## 2021-04-06 DIAGNOSIS — Z5321 Procedure and treatment not carried out due to patient leaving prior to being seen by health care provider: Secondary | ICD-10-CM | POA: Insufficient documentation

## 2021-04-06 DIAGNOSIS — R000 Tachycardia, unspecified: Secondary | ICD-10-CM | POA: Diagnosis present

## 2021-04-06 LAB — CBC
HCT: 29 % — ABNORMAL LOW (ref 36.0–46.0)
Hemoglobin: 9.1 g/dL — ABNORMAL LOW (ref 12.0–15.0)
MCH: 27.2 pg (ref 26.0–34.0)
MCHC: 31.4 g/dL (ref 30.0–36.0)
MCV: 86.8 fL (ref 80.0–100.0)
Platelets: 184 10*3/uL (ref 150–400)
RBC: 3.34 MIL/uL — ABNORMAL LOW (ref 3.87–5.11)
RDW: 14.8 % (ref 11.5–15.5)
WBC: 8.9 10*3/uL (ref 4.0–10.5)
nRBC: 0 % (ref 0.0–0.2)

## 2021-04-06 LAB — BASIC METABOLIC PANEL
Anion gap: 15 (ref 5–15)
BUN: 22 mg/dL (ref 8–23)
CO2: 23 mmol/L (ref 22–32)
Calcium: 8.1 mg/dL — ABNORMAL LOW (ref 8.9–10.3)
Chloride: 96 mmol/L — ABNORMAL LOW (ref 98–111)
Creatinine, Ser: 1.39 mg/dL — ABNORMAL HIGH (ref 0.44–1.00)
GFR, Estimated: 39 mL/min — ABNORMAL LOW (ref >60)
Glucose, Bld: 277 mg/dL — ABNORMAL HIGH (ref 70–99)
Potassium: 4.1 mmol/L (ref 3.5–5.1)
Sodium: 134 mmol/L — ABNORMAL LOW (ref 135–145)

## 2021-04-06 LAB — TROPONIN I (HIGH SENSITIVITY): Troponin I (High Sensitivity): 20 ng/L — ABNORMAL HIGH (ref <18)

## 2021-04-06 NOTE — ED Triage Notes (Signed)
Pt comes with c/o feeling like her heart is racing. Pt states this happened last week and came here but not seen by anyone due to wait time.   Pt seen by her PCP who prescribed her some anxiety meds. Pt has taken but no relief.

## 2021-04-06 NOTE — ED Notes (Signed)
Attempted to call pt. Pt's husband answered and stated they got tired of waiting. Pt feeling better. Pt instructed to come back if she starts to feel bad.

## 2021-04-10 ENCOUNTER — Other Ambulatory Visit: Payer: Self-pay

## 2021-04-10 ENCOUNTER — Emergency Department
Admission: EM | Admit: 2021-04-10 | Discharge: 2021-04-10 | Disposition: A | Discharge status: Home / Self Care | Payer: Medicare HMO | Attending: Emergency Medicine | Admitting: Emergency Medicine

## 2021-04-10 ENCOUNTER — Encounter: Payer: Self-pay | Admitting: Emergency Medicine

## 2021-04-10 DIAGNOSIS — I130 Hypertensive heart and chronic kidney disease with heart failure and stage 1 through stage 4 chronic kidney disease, or unspecified chronic kidney disease: Secondary | ICD-10-CM | POA: Diagnosis not present

## 2021-04-10 DIAGNOSIS — Z951 Presence of aortocoronary bypass graft: Secondary | ICD-10-CM | POA: Insufficient documentation

## 2021-04-10 DIAGNOSIS — Z91040 Latex allergy status: Secondary | ICD-10-CM | POA: Insufficient documentation

## 2021-04-10 DIAGNOSIS — Z87891 Personal history of nicotine dependence: Secondary | ICD-10-CM | POA: Insufficient documentation

## 2021-04-10 DIAGNOSIS — Z7902 Long term (current) use of antithrombotics/antiplatelets: Secondary | ICD-10-CM | POA: Diagnosis not present

## 2021-04-10 DIAGNOSIS — E1122 Type 2 diabetes mellitus with diabetic chronic kidney disease: Secondary | ICD-10-CM | POA: Insufficient documentation

## 2021-04-10 DIAGNOSIS — E039 Hypothyroidism, unspecified: Secondary | ICD-10-CM | POA: Insufficient documentation

## 2021-04-10 DIAGNOSIS — I5033 Acute on chronic diastolic (congestive) heart failure: Secondary | ICD-10-CM | POA: Insufficient documentation

## 2021-04-10 DIAGNOSIS — J449 Chronic obstructive pulmonary disease, unspecified: Secondary | ICD-10-CM | POA: Insufficient documentation

## 2021-04-10 DIAGNOSIS — I2510 Atherosclerotic heart disease of native coronary artery without angina pectoris: Secondary | ICD-10-CM | POA: Diagnosis not present

## 2021-04-10 DIAGNOSIS — N1831 Chronic kidney disease, stage 3a: Secondary | ICD-10-CM | POA: Insufficient documentation

## 2021-04-10 DIAGNOSIS — Z79899 Other long term (current) drug therapy: Secondary | ICD-10-CM | POA: Diagnosis not present

## 2021-04-10 DIAGNOSIS — Z7982 Long term (current) use of aspirin: Secondary | ICD-10-CM | POA: Diagnosis not present

## 2021-04-10 DIAGNOSIS — R040 Epistaxis: Secondary | ICD-10-CM | POA: Insufficient documentation

## 2021-04-10 DIAGNOSIS — Z794 Long term (current) use of insulin: Secondary | ICD-10-CM | POA: Insufficient documentation

## 2021-04-10 DIAGNOSIS — J45909 Unspecified asthma, uncomplicated: Secondary | ICD-10-CM | POA: Insufficient documentation

## 2021-04-10 MED ORDER — OXYCODONE-ACETAMINOPHEN 5-325 MG PO TABS
1.0000 | ORAL_TABLET | Freq: Once | ORAL | Status: AC
  Administered 2021-04-10: 1 via ORAL
  Filled 2021-04-10: qty 1

## 2021-04-10 MED ORDER — AMOXICILLIN-POT CLAVULANATE 875-125 MG PO TABS
1.0000 | ORAL_TABLET | Freq: Once | ORAL | Status: AC
  Administered 2021-04-10: 1 via ORAL
  Filled 2021-04-10: qty 1

## 2021-04-10 MED ORDER — AMOXICILLIN-POT CLAVULANATE 875-125 MG PO TABS: 1.0000 | ORAL_TABLET | Freq: Two times a day (BID) | ORAL | 0 refills | Status: AC

## 2021-04-10 MED ORDER — PHENYLEPHRINE HCL 0.5 % NA SOLN
1.0000 [drp] | Freq: Once | NASAL | Status: AC
  Administered 2021-04-10: 1 [drp] via NASAL
  Filled 2021-04-10: qty 15

## 2021-04-10 NOTE — ED Triage Notes (Signed)
Pt seen repeatly picking her nose and then placing kleenex in it. Told her to stop several times. Pt continues to pick and blow her nose. Bleeding is not excessive.

## 2021-04-10 NOTE — ED Triage Notes (Signed)
Pt brought over my [place] because she had a bloody nose. Nose is barley bleeding but pt is constantly picking it and sticking kleenex up her nose.

## 2021-04-10 NOTE — Discharge Instructions (Addendum)
Your nasal packing should be removed in about 2 to 3 days.  Please take the antibiotic with the packing is in place.  You can either return to the emergency department or follow-up with your primary care provider or the ENT doctors to have this removed.

## 2021-04-10 NOTE — ED Provider Notes (Signed)
Emergency Medicine Provider Triage Evaluation Note  [name] , a 75 y.o. female  was evaluated in triage.  Pt complains of nontraumatic left nostril epistasis x5 hours.  Bleeding began around 1 PM.  She is on Plavix and aspirin.  No head injury, headache.  She denies swallowing blood.  She denies any other bleeding  Review of Systems  Positive: Epistasis Negative: Trauma, headache  Physical Exam  There were no vitals taken for this visit. Gen:   Awake, no distress   Resp:  Normal effort  MSK:   Moves extremities without difficulty  Other:  Left nostril with very mild active bleeding.  Medical Decision Making  Medically screening exam initiated at 6:03 PM.  Appropriate orders placed.  [name] was informed that the remainder of the evaluation will be completed by another provider, this initial triage assessment does not replace that evaluation, and the importance of remaining in the ED until their evaluation is complete.  75 year old female with nontraumatic left epistasis.  On aspirin and Plavix.  Will place Afrin nasal spray into the left nostril with gauze and recheck in 20 minutes.   Duanne Guess, PA-C 04/10/21 1804    Arta Silence, MD 04/10/21 1851

## 2021-04-10 NOTE — ED Provider Notes (Addendum)
[facility]  ____________________________________________   Event Date/Time   First MD Initiated Contact with Patient 04/10/21 1930     (approximate)  I have reviewed the triage vital signs and the nursing notes.   HISTORY  Chief Complaint Epistaxis    HPI [name] is a 75 y.o. female with past medical history of coronary artery disease on aspirin and Plavix, asthma, diabetes who presents with epistaxis.  Symptoms started about 8 hours ago.  She has had constant bleeding in the left nare.  Has not held significant pressure and has been blowing the nose constantly.  Denies history of similar.  Denies other bleeding.         Past Medical History:  Diagnosis Date   1st degree AV block    ACE-inhibitor cough    Allergic rhinitis    Anemia    iron deficiency anemia   Anxiety    Aortic ectasia (HCC)    a. CT abd in 12/2016 incidentally noted aortic atherosclerosis and infrarenal abdominal aortic ectasia measuring as large as 2.7 cm with recommendation to repeat US in 2023.   Arthritis    Asthma    Cataract    Chronic depression    Chronic diastolic CHF (congestive heart failure) (HCC)    Chronic headache    COPD (chronic obstructive pulmonary disease) (HCC)    Coronary artery disease    a. DES to RCA and mid Cx 2009. b. CABG and bioprosthetic AVR May 2015. c. cutting balloon to prox Cx in 05/2016   Diabetes mellitus    type 2   Diverticulitis of colon    Essential hypertension    GERD (gastroesophageal reflux disease)    Hearing loss    History of blood transfusion 2013   History of prosthetic aortic valve replacement    HOH (hard of hearing)    Hypercholesterolemia    intolerance of statins and niaspan   IDA (iron deficiency anemia) 02/03/2019   Mobitz type 1 second degree AV block    OSA (obstructive sleep apnea)    mild, intolerant of cpap   PAD (peripheral artery disease) ([place])    a. atherosclerosis by CT abd 12/2016 in [place].   PONV  (postoperative nausea and vomiting)    Statin intolerance    Thyroid disease     Patient Active Problem List   Diagnosis Date Noted   Acute on chronic diastolic (congestive) heart failure (HCC) 11/24/2020   SOB (shortness of breath) 11/24/2020   Acute hyponatremia 11/24/2020   Prolonged QT interval 11/24/2020   Angina pectoris ([place]) 11/03/2020   CHF exacerbation ([place]) 09/07/2020   Elevated troponin 09/07/2020   Chest pain 08/09/2020   Acute on chronic diastolic CHF (congestive heart failure) ([place]) 08/08/2020   Anemia due to stage 3a chronic kidney disease ([place]) 05/21/2019   IDA (iron deficiency anemia) 02/03/2019   COPD exacerbation ([place]) 12/28/2017   Depression, major, single episode, complete remission ([place]) 04/11/2017   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   Aortic ectasia, abdominal ([place]) 01/01/2017   GAD (generalized anxiety disorder) 09/26/2016   Acute posthemorrhagic anemia 08/24/2016   Acute respiratory failure with hypoxia ([place]) 78/29/5621   Acute diastolic CHF (congestive heart failure) ([place]) 08/24/2016   Hyponatremia 08/24/2016   Leukocytosis 08/24/2016   Fever 08/24/2016   Diarrhea 08/24/2016   Generalized weakness 08/24/2016   Acute diverticulitis 08/16/2016   Gastrointestinal hemorrhage    Ataxia 08/09/2016   Dizziness 08/09/2016   Staggering gait 08/09/2016   NSTEMI (non-ST  elevated myocardial infarction) ([place])    AVB (atrioventricular block)    Atypical chest pain    Unstable angina ([place]) 05/25/2016   Palpitations 05/25/2016   PUD - recent Rx for H.Pylori 05/25/2016   Stenosis of coronary artery stent    Recurrent major depressive disorder, in partial remission ([place]) 07/29/2015   Sialadenitis 04/25/2015   Viral URI 03/24/2015   Hypoglycemia 08/25/2014   Nausea without vomiting 08/25/2014   Dyslipidemia-statin ontol    Long-term insulin use ([place]) 03/31/2014   Aortic valve disorder 11/11/2013   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 10/12/2013   Coronary Artery Disease  s/p CABG 10/2013 10/10/2013   Other malaise and fatigue 09/11/2013   Alopecia 03/06/2013   Insomnia 02/06/2013   Gastroesophageal reflux disease without esophagitis 01/19/2013   Dysphagia, unspecified(787.20) 01/19/2013   Dyspnea 11/17/2012   Weakness 11/17/2012   Abnormal MRI of head 09/05/2012   Depression with anxiety 03/21/2012   Extrinsic asthma 11/12/2011   Anemia 06/07/2011   Chronic obstructive pulmonary disease ([place]) 01/25/2010   Acquired hypothyroidism 08/10/2009   MICROSCOPIC HEMATURIA 05/07/2009   GOITER 03/02/2009   Essential hypertension 01/06/2009   MEMORY LOSS 06/23/2008   Obstructive sleep apnea 12/25/2007   DM type 2 (diabetes mellitus, type 2) ([place]) 09/09/2006   ALLERGIC RHINITIS 09/09/2006   DIVERTICULOSIS, COLON 09/09/2006    Past Surgical History:  Procedure Laterality Date   ABDOMINAL HYSTERECTOMY     ABDOMINAL HYSTERECTOMY W/ PARTIAL VAGINACTOMY     AORTIC VALVE REPLACEMENT N/A 10/12/2013   Procedure: AORTIC VALVE REPLACEMENT (AVR);  Surgeon: [name], MD;  Location: [place];  Service: Open Heart Surgery;  Laterality: N/A;   APPENDECTOMY  1964   BARTHOLIN GLAND CYST EXCISION     BLADDER SUSPENSION     BREAST BIOPSY Bilateral 09/11/2000   neg   BREAST BIOPSY Left 07/24/2010   neg   BREAST CYST EXCISION  1988   bilateral nonmalignant tumors, x3   CARDIAC CATHETERIZATION     CARDIAC CATHETERIZATION N/A 05/25/2016   Procedure: Coronary Balloon Angioplasty;  Surgeon: Leonie Man, MD;  Location: [place] [facility];  Service: Cardiovascular;  Laterality: N/A;   CARDIAC CATHETERIZATION N/A 05/25/2016   Procedure: Coronary/Graft Angiography;  Surgeon: Leonie Man, MD;  Location: [place];  Service: Cardiovascular;  Laterality: N/A;   CATARACT EXTRACTION W/ INTRAOCULAR LENS  IMPLANT, BILATERAL     CHOLECYSTECTOMY  2001   COLECTOMY     lap sigmoid   COLONOSCOPY  2014   polyps found, 2 clamped off.   CORONARY ANGIOPLASTY  10/29/2007    Prox RCA & Mid Cx.   CORONARY ARTERY BYPASS GRAFT N/A 10/12/2013   Procedure: CORONARY ARTERY BYPASS GRAFT TIMES TWO;  Surgeon: [name], MD;  Location: [facility];  Service: Open Heart Surgery;  Laterality: N/A;   CORONARY/GRAFT ANGIOGRAPHY N/A 09/20/2017   Procedure: CORONARY/GRAFT ANGIOGRAPHY;  Surgeon: Sherren Mocha, MD;  Location: [place] [place] [facility];  Service: Cardiovascular;  Laterality: N/A;   LEFT HEART CATHETERIZATION WITH CORONARY ANGIOGRAM N/A 10/09/2013   Procedure: LEFT HEART CATHETERIZATION WITH CORONARY ANGIOGRAM;  Surgeon: [name], MD;  Location: [facility];  Service: Cardiovascular;  Laterality: N/A;   RIGHT/LEFT HEART CATH AND CORONARY ANGIOGRAPHY N/A 11/03/2020   Procedure: RIGHT/LEFT HEART CATH AND CORONARY ANGIOGRAPHY;  Surgeon: Corey Skains, MD;  Location: [place] [facility];  Service: Cardiovascular;  Laterality: N/A;   RIGHT/LEFT HEART CATH AND CORONARY/GRAFT ANGIOGRAPHY N/A 02/10/2021   Procedure: RIGHT/LEFT  HEART CATH AND CORONARY/GRAFT ANGIOGRAPHY;  Surgeon: Andrez Grime, MD;  Location: [place] [facility];  Service: Cardiovascular;  Laterality: N/A;   STERNAL WIRES REMOVAL N/A 04/13/2014   Procedure: STERNAL WIRES REMOVAL;  Surgeon: [name], MD;  Location: [place];  Service: Thoracic;  Laterality: N/A;   TEE WITHOUT CARDIOVERSION N/A 11/03/2020   Procedure: TRANSESOPHAGEAL ECHOCARDIOGRAM (TEE);  Surgeon: Corey Skains, MD;  Location: [facility];  Service: Cardiovascular;  Laterality: N/A;   TEE WITHOUT CARDIOVERSION N/A 02/13/2021   Procedure: TRANSESOPHAGEAL ECHOCARDIOGRAM (TEE);  Surgeon: Corey Skains, MD;  Location: [facility];  Service: Cardiovascular;  Laterality: N/A;   THYROIDECTOMY     TONSILLECTOMY     TUBAL LIGATION     VAGINAL DELIVERY     3   VISCERAL ARTERY INTERVENTION N/A 08/16/2016   Procedure: Visceral Artery Intervention;  Surgeon: Algernon Huxley, MD;  Location: [place] [facility];  Service: Cardiovascular;   Laterality: N/A;    Prior to Admission medications   Medication Sig Start Date End Date Taking? Authorizing Provider  amoxicillin-clavulanate (AUGMENTIN) 875-125 MG tablet Take 1 tablet by mouth 2 (two) times daily for 3 days. 04/10/21 04/13/21 Yes Rada Hay, MD  acetaminophen (TYLENOL) 500 MG tablet Take 500-1,000 mg by mouth every 6 (six) hours as needed for mild pain.    [provider]  albuterol (PROVENTIL) (2.5 MG/3ML) 0.083% nebulizer solution Take 2.5 mg by nebulization 3 (three) times daily. 06/07/20   [provider]  ANORO ELLIPTA 62.5-25 MCG/INH AEPB Inhale 1 puff into the lungs daily. 10/11/20   [provider]  aspirin EC 81 MG EC tablet Take 1 tablet (81 mg total) by mouth daily. Swallow whole. 08/10/20   Samuella Cota, MD  budesonide (PULMICORT) 1 MG/2ML nebulizer solution Inhale 2 mLs into the lungs at bedtime. 02/15/21 02/15/22  [provider]  clopidogrel (PLAVIX) 75 MG tablet Take 75 mg by mouth daily. 11/15/20   [provider]  diazepam (VALIUM) 2 MG tablet Take 1 tablet (2 mg total) by mouth as directed for 1 dose. Take one tablet 1 hour prior to your CT scans. Bring the second tablet to your appointment with you and only take if needed. 03/10/21   Eileen Stanford, PA-C  DULoxetine (CYMBALTA) 20 MG capsule Take 1 capsule (20 mg total) by mouth in the morning. 11/26/20   Jennye Boroughs, MD  FLUoxetine (PROZAC) 40 MG capsule Take 40 mg by mouth 2 (two) times daily. 08/23/20   [provider]  guaiFENesin (MUCINEX) 600 MG 12 hr tablet Take 2 tablets (1,200 mg total) by mouth 2 (two) times daily. Patient not taking: No sig reported 02/13/21   Aline August, MD  Insulin Glargine [facility] KWIKPEN) 100 UNIT/ML Inject 100 Units into the skin at bedtime.    [provider]  insulin lispro (HUMALOG) 100 UNIT/ML injection Inject 0-54 Units into the skin 3 (three) times daily before meals.    [provider]   isosorbide mononitrate (IMDUR) 30 MG 24 hr tablet Take 1 tablet (30 mg total) by mouth daily. 11/03/20 11/03/21  Corey Skains, MD  levothyroxine (SYNTHROID, LEVOTHROID) 125 MCG tablet Take 125 mcg by mouth daily before breakfast. 04/03/17   [provider]  losartan (COZAAR) 25 MG tablet Take 1 tablet (25 mg total) by mouth in the morning. 11/26/20   Jennye Boroughs, MD  methylPREDNISolone (MEDROL DOSEPAK) 4 MG TBPK tablet Take one tablet (4 mg) at bedtime and  take one tablet in the morning. Patient not taking: Reported on 03/06/2021 02/13/21   Aline August, MD  nitroGLYCERIN (NITROSTAT) 0.4 MG SL tablet Place 1 tablet (0.4 mg total) under the tongue every 5 (five) minutes as needed for chest pain. 03/06/21   [name], MD  ondansetron (ZOFRAN-ODT) 4 MG disintegrating tablet Take 4 mg by mouth 3 (three) times daily as needed for nausea or vomiting. 02/14/21   [provider]  pantoprazole (PROTONIX) 40 MG tablet Take 40 mg by mouth in the morning.    [provider]  torsemide (DEMADEX) 20 MG tablet Take 20 mg by mouth daily. 12/08/20   [provider]  traZODone (DESYREL) 100 MG tablet Take 200 mg by mouth at bedtime.    [provider]    Allergies Amitriptyline, Benadryl [diphenhydramine], Demerol [meperidine], Gabapentin, Mirtazapine, Olanzapine, Voltaren [diclofenac sodium], Zetia [ezetimibe], Ativan [lorazepam], Atorvastatin, Budesonide-formoterol fumarate, Bupropion hcl, Caffeine, Codeine sulfate, Lisinopril, Metformin, Mometasone furoate, Morphine sulfate, Other, Oxycodone-acetaminophen, Pioglitazone, Propoxyphene n-acetaminophen, Rosuvastatin, Shellfish allergy, Suvorexant, Ticagrelor, Tramadol, Venlafaxine, Xanax [alprazolam], Zolpidem tartrate, and Latex  Family History  Problem Relation Age of Onset   Breast cancer Mother 1   Hypertension Father    Mesothelioma Father    Asthma Father    Stroke Paternal Grandfather    Heart  disease Other    Breast cancer Maternal Aunt    Breast cancer Paternal Aunt     Social History Social History   Tobacco Use   Smoking status: Former    Packs/day: 0.50    Years: 30.00    Pack years: 15.00    Types: Cigarettes    Quit date: 10/02/2013    Years since quitting: 7.5   Smokeless tobacco: Never  Vaping Use   Vaping Use: Never used  Substance Use Topics   Alcohol use: No   Drug use: No    Review of Systems   Review of Systems  HENT:         Epistaxis  All other systems reviewed and are negative.  Physical Exam Updated Vital Signs BP 110/62 (BP Location: Left Arm)   Pulse 89   Temp 98.7 F (37.1 C) (Oral)   Resp 18   Ht 5\' 6"  (1.676 m)   Wt 90.7 kg   SpO2 96%   BMI 32.28 kg/m   Physical Exam Vitals and nursing note reviewed.  Constitutional:      General: She is not in acute distress.    Appearance: Normal appearance.     Comments: Patient is in distress, gagging and frequently spitting up blood  HENT:     Head: Normocephalic and atraumatic.     Nose:     Comments: Small amount of blood dripping from the left nare, no obvious source identified Eyes:     General: No scleral icterus.    Conjunctiva/sclera: Conjunctivae normal.  Pulmonary:     Effort: Pulmonary effort is normal. No respiratory distress.     Breath sounds: No stridor.  Musculoskeletal:        General: No deformity or signs of injury.     Cervical back: Normal range of motion.  Skin:    General: Skin is dry.     Coloration: Skin is not jaundiced or pale.  Neurological:     General: No focal deficit present.     Mental Status: She is alert and oriented to person, place, and time. Mental status is at baseline.  Psychiatric:  Mood and Affect: Mood normal.        Behavior: Behavior normal.     LABS (all labs ordered are listed, but only abnormal results are displayed)  Labs Reviewed - No data to  display ____________________________________________  EKG  N/a ____________________________________________  RADIOLOGY Almeta Monas, personally viewed and evaluated these images (plain radiographs) as part of my medical decision making, as well as reviewing the written report by the radiologist.  ED MD interpretation:  n/a    ____________________________________________   PROCEDURES  Procedure(s) performed (including Critical Care):  .Epistaxis Management  Date/Time: 04/10/2021 9:36 PM Performed by: Rada Hay, MD Authorized by: Rada Hay, MD   Consent:    Consent obtained:  Verbal   Risks, benefits, and alternatives were discussed: yes     Risks discussed:  Pain   Alternatives discussed:  No treatment and alternative treatment Universal protocol:    Patient identity confirmed:  Verbally with patient Anesthesia:    Anesthesia method:  None Procedure details:    Treatment site:  L anterior   Treatment method:  Nasal tampon   Treatment complexity:  Limited   Treatment episode: initial   Post-procedure details:    Assessment:  Bleeding stopped   Procedure completion:  Tolerated well, no immediate complications   ____________________________________________   INITIAL IMPRESSION / ASSESSMENT AND PLAN / ED COURSE     Patient is a 75 year old female who presents with epistaxis x8 hours.  From triage she given Neo-Synephrine nasal spray.  At the time of my evaluation she is having a small trickle from the nose and is very preoccupied by the bleeding and constantly blowing the nose and picking at it.  She is occasionally spitting up clots.  Initially attempted putting packing with a cotton pledget soaked with Neo-Synephrine however there was still some mild bleeding and patient was still very distressed so ultimately proceeded to anterior nasal packing with resolution of the bleeding down the posterior oropharynx.  Patient given prophylactic  Augmentin.  Discussed packing removal in about 2 to 3 days with either return to the ED, primary care or ENT if able to be seen.  ____________________________________________   FINAL CLINICAL IMPRESSION(S) / ED DIAGNOSES  Final diagnoses:  Epistaxis     ED Discharge Orders          Ordered    amoxicillin-clavulanate (AUGMENTIN) 875-125 MG tablet  2 times daily        04/10/21 2131             Note:  This document was prepared using Dragon voice recognition software and may include unintentional dictation errors.    Rada Hay, MD 04/10/21 2135    Rada Hay, MD 04/10/21 2137

## 2021-04-12 ENCOUNTER — Encounter: Payer: Self-pay | Admitting: Surgery

## 2021-04-12 ENCOUNTER — Institutional Professional Consult (permissible substitution): Payer: Medicare HMO | Admitting: Surgery

## 2021-04-12 ENCOUNTER — Other Ambulatory Visit: Payer: Self-pay

## 2021-04-12 VITALS — BP 94/59 | HR 81 | Resp 20 | Ht 66.0 in | Wt 200.0 lb

## 2021-04-12 DIAGNOSIS — I350 Nonrheumatic aortic (valve) stenosis: Secondary | ICD-10-CM

## 2021-04-12 NOTE — Progress Notes (Addendum)
[facility]   MULTIDISCIPLINARY HEART VALVE CLINIC         [name]       [place] [address]             [phone]          CARDIOTHORACIC SURGERY CONSULTATION REPORT  PCP is Rusty Aus, MD Referring Provider is [name], MD Primary Cardiologist is [name], MD  Reason for consultation: Severe aortic stenosis  HPI:  The patient is a 75 year old woman with a history of type 2 diabetes, hypertension, hyperlipidemia, severe COPD on home oxygen, anemia, coronary artery disease status post stenting of the RCA and left circumflex in 2009 followed by AVR and CABG by me in 2015.  She had a left internal mammary graft to the LAD and a saphenous vein graft to the right coronary artery with a 21 mm Ebbert Magna-Ease pericardial valve.  [facility] and cardiac catheterization showed a patent LIMA to the LAD and patent saphenous vein graft to the RCA with progression of in-stent restenosis in the proximal left circumflex that was treated with Cutting Balloon angioplasty.  She presented again in April 2019 feeling poorly with chest pressure, shortness of breath, fatigue, and dizziness.  Repeat catheterization showed patent bypass grafts and a patent left circumflex stent with mild restenosis.  Echo showed a normal functioning bioprosthetic aortic valve at that time.  She was admitted to [facility] with unstable angina in June 2022 and had stenting of the left circumflex and the saphenous vein graft to the RCA.  Repeat catheterization on 02/10/2021 showed stable coronary disease with chronic total occlusion of the mid LAD and mid RCA with patent left circumflex stents with moderately severe distal AV groove stenosis with both bypass grafts patent.  Echo on 02/13/2021 showed severe LVH.  Ejection fraction was 50 to 55%.  The bioprosthetic aortic valve was poorly visualized but appeared calcified and  stenotic.  Her most recent echocardiogram on 03/01/2021 showed severe LVH with an ejection fraction of 60 to 65%.  The prosthetic aortic valve had severe stenosis with a mean gradient of 41 mmHg and a peak gradient of 70 mmHg.  She continues to report severe shortness of breath with any activity as well as at rest.  She said that she is mostly sedentary at home because she cannot get up and walk without getting short of breath.  She wears oxygen day and night.  She reports occasional chest pains.  She has had no dizziness or syncope.  She denies lower extremity edema.  Her husband is with her today and said that she has been worsening.  He has also noted significant memory issues.  Past Medical History:  Diagnosis Date   1st degree AV block    ACE-inhibitor cough    Allergic rhinitis    Anemia    iron deficiency anemia   Anxiety    Aortic ectasia (HCC)    a. CT abd in 12/2016 incidentally noted aortic atherosclerosis and infrarenal abdominal aortic ectasia measuring as large as 2.7 cm with recommendation to repeat US in 2023.   Arthritis    Asthma    Cataract    Chronic depression    Chronic diastolic CHF (congestive heart failure) (HCC)    Chronic headache    COPD (chronic obstructive pulmonary disease) (HCC)    Coronary artery disease    a. DES to RCA and mid Cx  2009. b. CABG and bioprosthetic AVR May 2015. c. cutting balloon to prox Cx in 05/2016   Diabetes mellitus    type 2   Diverticulitis of colon    Essential hypertension    GERD (gastroesophageal reflux disease)    Hearing loss    History of blood transfusion 2013   History of prosthetic aortic valve replacement    HOH (hard of hearing)    Hypercholesterolemia    intolerance of statins and niaspan   IDA (iron deficiency anemia) 02/03/2019   Mobitz type 1 second degree AV block    OSA (obstructive sleep apnea)    mild, intolerant of cpap   PAD (peripheral artery disease) ([place])    a. atherosclerosis by CT abd 12/2016 in [place].    PONV (postoperative nausea and vomiting)    Statin intolerance    Thyroid disease     Past Surgical History:  Procedure Laterality Date   ABDOMINAL HYSTERECTOMY     ABDOMINAL HYSTERECTOMY W/ PARTIAL VAGINACTOMY     AORTIC VALVE REPLACEMENT N/A 10/12/2013   Procedure: AORTIC VALVE REPLACEMENT (AVR);  Surgeon: [name], MD;  Location: [facility];  Service: Open Heart Surgery;  Laterality: N/A;   APPENDECTOMY  1964   BARTHOLIN GLAND CYST EXCISION     BLADDER SUSPENSION     BREAST BIOPSY Bilateral 09/11/2000   neg   BREAST BIOPSY Left 07/24/2010   neg   BREAST CYST EXCISION  1988   bilateral nonmalignant tumors, x3   CARDIAC CATHETERIZATION     CARDIAC CATHETERIZATION N/A 05/25/2016   Procedure: Coronary Balloon Angioplasty;  Surgeon: Leonie Man, MD;  Location: [place] [facility];  Service: Cardiovascular;  Laterality: N/A;   CARDIAC CATHETERIZATION N/A 05/25/2016   Procedure: Coronary/Graft Angiography;  Surgeon: Leonie Man, MD;  Location: [place] [facility];  Service: Cardiovascular;  Laterality: N/A;   CATARACT EXTRACTION W/ INTRAOCULAR LENS  IMPLANT, BILATERAL     CHOLECYSTECTOMY  2001   COLECTOMY     lap sigmoid   COLONOSCOPY  2014   polyps found, 2 clamped off.   CORONARY ANGIOPLASTY  10/29/2007   Prox RCA & Mid Cx.   CORONARY ARTERY BYPASS GRAFT N/A 10/12/2013   Procedure: CORONARY ARTERY BYPASS GRAFT TIMES TWO;  Surgeon: [name], MD;  Location: [facility];  Service: Open Heart Surgery;  Laterality: N/A;   CORONARY/GRAFT ANGIOGRAPHY N/A 09/20/2017   Procedure: CORONARY/GRAFT ANGIOGRAPHY;  Surgeon: Sherren Mocha, MD;  Location: [place] [facility];  Service: Cardiovascular;  Laterality: N/A;   LEFT HEART CATHETERIZATION WITH CORONARY ANGIOGRAM N/A 10/09/2013   Procedure: LEFT HEART CATHETERIZATION WITH CORONARY ANGIOGRAM;  Surgeon: [name], MD;  Location: [facility];  Service: Cardiovascular;  Laterality: N/A;   RIGHT/LEFT HEART CATH AND CORONARY  ANGIOGRAPHY N/A 11/03/2020   Procedure: RIGHT/LEFT HEART CATH AND CORONARY ANGIOGRAPHY;  Surgeon: Corey Skains, MD;  Location: [place] [facility];  Service: Cardiovascular;  Laterality: N/A;   RIGHT/LEFT HEART CATH AND CORONARY/GRAFT ANGIOGRAPHY N/A 02/10/2021   Procedure: RIGHT/LEFT HEART CATH AND CORONARY/GRAFT ANGIOGRAPHY;  Surgeon: Andrez Grime, MD;  Location: [place] [facility];  Service: Cardiovascular;  Laterality: N/A;   STERNAL WIRES REMOVAL N/A 04/13/2014   Procedure: STERNAL WIRES REMOVAL;  Surgeon: [name], MD;  Location: [place];  Service: Thoracic;  Laterality: N/A;   TEE WITHOUT CARDIOVERSION N/A 11/03/2020   Procedure: TRANSESOPHAGEAL ECHOCARDIOGRAM (TEE);  Surgeon: Corey Skains, MD;  Location: [facility];  Service:  Cardiovascular;  Laterality: N/A;   TEE WITHOUT CARDIOVERSION N/A 02/13/2021   Procedure: TRANSESOPHAGEAL ECHOCARDIOGRAM (TEE);  Surgeon: Corey Skains, MD;  Location: [facility];  Service: Cardiovascular;  Laterality: N/A;   THYROIDECTOMY     TONSILLECTOMY     TUBAL LIGATION     VAGINAL DELIVERY     3   VISCERAL ARTERY INTERVENTION N/A 08/16/2016   Procedure: Visceral Artery Intervention;  Surgeon: Algernon Huxley, MD;  Location: [place] [facility];  Service: Cardiovascular;  Laterality: N/A;    Family History  Problem Relation Age of Onset   Breast cancer Mother 19   Hypertension Father    Mesothelioma Father    Asthma Father    Stroke Paternal Grandfather    Heart disease Other    Breast cancer Maternal Aunt    Breast cancer Paternal Aunt     Social History   Socioeconomic History   Marital status: Married    Spouse name: Not on file   Number of children: 3   Years of education: Not on file   Highest education level: Not on file  Occupational History   Occupation: Retired    Fish farm manager: UNEMPLOYED    Comment: CNA  Tobacco Use   Smoking status: Former    Packs/day: 0.50    Years: 30.00    Pack years: 15.00    Types:  Cigarettes    Quit date: 10/02/2013    Years since quitting: 7.5   Smokeless tobacco: Never  Vaping Use   Vaping Use: Never used  Substance and Sexual Activity   Alcohol use: No   Drug use: No   Sexual activity: Not Currently  Other Topics Concern   Not on file  Social History Narrative   Does not have Living Will   Desires CPR, would not want prolonged life support if futile.   Social Determinants of Health   Financial Resource Strain: Not on file  Food Insecurity: Not on file  Transportation Needs: Not on file  Physical Activity: Not on file  Stress: Not on file  Social Connections: Not on file  Intimate Partner Violence: Not on file    Prior to Admission medications   Medication Sig Start Date End Date Taking? Authorizing Provider  acetaminophen (TYLENOL) 500 MG tablet Take 500-1,000 mg by mouth every 6 (six) hours as needed for mild pain.   Yes [provider]  albuterol (PROVENTIL) (2.5 MG/3ML) 0.083% nebulizer solution Take 2.5 mg by nebulization 3 (three) times daily. 06/07/20  Yes [provider]  amoxicillin-clavulanate (AUGMENTIN) 875-125 MG tablet Take 1 tablet by mouth 2 (two) times daily for 3 days. 04/10/21 04/13/21 Yes Rada Hay, MD  ANORO ELLIPTA 62.5-25 MCG/INH AEPB Inhale 1 puff into the lungs daily. 10/11/20  Yes [provider]  aspirin EC 81 MG EC tablet Take 1 tablet (81 mg total) by mouth daily. Swallow whole. 08/10/20  Yes Samuella Cota, MD  budesonide (PULMICORT) 1 MG/2ML nebulizer solution Inhale 2 mLs into the lungs at bedtime. 02/15/21 02/15/22 Yes [provider]  clopidogrel (PLAVIX) 75 MG tablet Take 75 mg by mouth daily. 11/15/20  Yes [provider]  diazepam (VALIUM) 2 MG tablet Take 1 tablet (2 mg total) by mouth as directed for 1 dose. Take one tablet 1 hour prior to your CT scans. Bring the second tablet to your appointment with you and only take if needed. 03/10/21  Yes Eileen [place], PA-C   DULoxetine (CYMBALTA) 20 MG capsule  Take 1 capsule (20 mg total) by mouth in the morning. 11/26/20  Yes Jennye Boroughs, MD  FLUoxetine (PROZAC) 40 MG capsule Take 40 mg by mouth 2 (two) times daily. 08/23/20  Yes [provider]  guaiFENesin (MUCINEX) 600 MG 12 hr tablet Take 2 tablets (1,200 mg total) by mouth 2 (two) times daily. 02/13/21  Yes Aline August, MD  Insulin Glargine (BASAGLAR KWIKPEN) 100 UNIT/ML Inject 100 Units into the skin at bedtime.   Yes [provider]  insulin lispro (HUMALOG) 100 UNIT/ML injection Inject 0-54 Units into the skin 3 (three) times daily before meals.   Yes [provider]  isosorbide mononitrate (IMDUR) 30 MG 24 hr tablet Take 1 tablet (30 mg total) by mouth daily. 11/03/20 11/03/21 Yes Corey Skains, MD  levothyroxine (SYNTHROID, LEVOTHROID) 125 MCG tablet Take 125 mcg by mouth daily before breakfast. 04/03/17  Yes [provider]  losartan (COZAAR) 25 MG tablet Take 1 tablet (25 mg total) by mouth in the morning. 11/26/20  Yes Jennye Boroughs, MD  methylPREDNISolone (MEDROL DOSEPAK) 4 MG TBPK tablet Take one tablet (4 mg) at bedtime and take one tablet in the morning. 02/13/21  Yes Aline August, MD  nitroGLYCERIN (NITROSTAT) 0.4 MG SL tablet Place 1 tablet (0.4 mg total) under the tongue every 5 (five) minutes as needed for chest pain. 03/06/21  Yes [name], MD  ondansetron (ZOFRAN-ODT) 4 MG disintegrating tablet Take 4 mg by mouth 3 (three) times daily as needed for nausea or vomiting. 02/14/21  Yes [provider]  pantoprazole (PROTONIX) 40 MG tablet Take 40 mg by mouth in the morning.   Yes [provider]  torsemide (DEMADEX) 20 MG tablet Take 20 mg by mouth daily. 12/08/20  Yes [provider]  traZODone (DESYREL) 100 MG tablet Take 200 mg by mouth at bedtime.   Yes [provider]    Current Outpatient Medications  Medication Sig Dispense Refill   acetaminophen  (TYLENOL) 500 MG tablet Take 500-1,000 mg by mouth every 6 (six) hours as needed for mild pain.     albuterol (PROVENTIL) (2.5 MG/3ML) 0.083% nebulizer solution Take 2.5 mg by nebulization 3 (three) times daily.     amoxicillin-clavulanate (AUGMENTIN) 875-125 MG tablet Take 1 tablet by mouth 2 (two) times daily for 3 days. 6 tablet 0   ANORO ELLIPTA 62.5-25 MCG/INH AEPB Inhale 1 puff into the lungs daily.     aspirin EC 81 MG EC tablet Take 1 tablet (81 mg total) by mouth daily. Swallow whole. 30 tablet 11   budesonide (PULMICORT) 1 MG/2ML nebulizer solution Inhale 2 mLs into the lungs at bedtime.     clopidogrel (PLAVIX) 75 MG tablet Take 75 mg by mouth daily.     diazepam (VALIUM) 2 MG tablet Take 1 tablet (2 mg total) by mouth as directed for 1 dose. Take one tablet 1 hour prior to your CT scans. Bring the second tablet to your appointment with you and only take if needed. 2 tablet 0   DULoxetine (CYMBALTA) 20 MG capsule Take 1 capsule (20 mg total) by mouth in the morning.     FLUoxetine (PROZAC) 40 MG capsule Take 40 mg by mouth 2 (two) times daily.     guaiFENesin (MUCINEX) 600 MG 12 hr tablet Take 2 tablets (1,200 mg total) by mouth 2 (two) times daily. 30 tablet 0   Insulin Glargine (BASAGLAR KWIKPEN) 100 UNIT/ML Inject 100 Units into the skin at bedtime.  insulin lispro (HUMALOG) 100 UNIT/ML injection Inject 0-54 Units into the skin 3 (three) times daily before meals.     isosorbide mononitrate (IMDUR) 30 MG 24 hr tablet Take 1 tablet (30 mg total) by mouth daily. 30 tablet 11   levothyroxine (SYNTHROID, LEVOTHROID) 125 MCG tablet Take 125 mcg by mouth daily before breakfast.     losartan (COZAAR) 25 MG tablet Take 1 tablet (25 mg total) by mouth in the morning.     methylPREDNISolone (MEDROL DOSEPAK) 4 MG TBPK tablet Take one tablet (4 mg) at bedtime and take one tablet in the morning. 2 tablet 0   nitroGLYCERIN (NITROSTAT) 0.4 MG SL tablet Place 1 tablet (0.4 mg total) under the tongue  every 5 (five) minutes as needed for chest pain. 90 tablet 3   ondansetron (ZOFRAN-ODT) 4 MG disintegrating tablet Take 4 mg by mouth 3 (three) times daily as needed for nausea or vomiting.     pantoprazole (PROTONIX) 40 MG tablet Take 40 mg by mouth in the morning.     torsemide (DEMADEX) 20 MG tablet Take 20 mg by mouth daily.     traZODone (DESYREL) 100 MG tablet Take 200 mg by mouth at bedtime.     No current facility-administered medications for this visit.    Allergies  Allergen Reactions   Amitriptyline Other (See Comments)    Unknown reaction   Benadryl [Diphenhydramine] Shortness Of Breath   Demerol [Meperidine] Other (See Comments)    Unknown reaction   Gabapentin Other (See Comments)    Unknown reaction   Mirtazapine Other (See Comments)    Unknown reaction   Olanzapine Other (See Comments)    Unknown reaction    Voltaren [Diclofenac Sodium] Shortness Of Breath   Zetia [Ezetimibe] Other (See Comments)    Weakness in legs, shakiness all over   Ativan [Lorazepam] Other (See Comments)    Causes double vision at highter than .5 mg dose   Atorvastatin Other (See Comments)    Muscle aches and weakness   Budesonide-Formoterol Fumarate Other (See Comments)    Shakiness, tremors   Bupropion Hcl Other (See Comments)    "cloud over me" depression   Caffeine Other (See Comments)    jitters   Codeine Sulfate Other (See Comments)    Makes chest hurt like a heart attack   Lisinopril Cough   Metformin Nausea And Vomiting   Mometasone Furoate Nausea And Vomiting   Morphine Sulfate Other (See Comments)    Chest pain like a heart attack   Other Other (See Comments)    Beta Blockers, reaction shortness of breath   Oxycodone-Acetaminophen Nausea And Vomiting   Pioglitazone Other (See Comments)    Cannot take because of risk of bladder cancer   Propoxyphene N-Acetaminophen Nausea And Vomiting   Rosuvastatin Other (See Comments)    Muscle aches and weakness   Shellfish Allergy  Diarrhea   Suvorexant Other (See Comments)    Jerking/nervous    Ticagrelor     Other reaction(s): Other (See Comments) "slowed heart rate" & chest pain   Tramadol Nausea Only   Venlafaxine Other (See Comments)    Unknown reaction   Xanax [Alprazolam] Other (See Comments)    Mental status changes, "didn't know where she was at" per husband at bedside   Zolpidem Tartrate Other (See Comments)     Jittery, diarrhea   Latex Rash      Review of Systems:   General:  normal appetite, + decreased energy, + weight gain,  no weight loss, no fever  Cardiac:  + chest pain with exertion, no chest pain at rest, +SOB with any exertion, + resting SOB, no PND, + orthopnea, no palpitations, no arrhythmia, no atrial fibrillation, no LE edema, no dizzy spells, no syncope  Respiratory:  +  shortness of breath, + home oxygen, no productive cough, no dry cough, no bronchitis, no wheezing, no hemoptysis, no asthma, no pain with inspiration or cough, no sleep apnea, no CPAP at night  GI:   no difficulty swallowing, no reflux, no frequent heartburn, no hiatal hernia, no abdominal pain, no constipation, no diarrhea, no hematochezia, no hematemesis, no melena  GU:   n dysuria,  ono frequency, no urinary tract infection, no hematuria, no kidney stones, no kidney disease  Vascular:  no pain suggestive of claudication, no pain in feet, no leg cramps, no varicose veins, no DVT, no non-healing foot ulcer  Neuro:   no stroke, no TIA's, no seizures, + headaches, no temporary blindness one eye,  no slurred speech, no peripheral neuropathy, no chronic pain, no instability of gait, + memory/cognitive dysfunction  Musculoskeletal: + arthritis, no joint swelling, no myalgias, + difficulty walking, + reduced mobility   Skin:   no rash, no itching, no skin infections, no pressure sores or ulcerations  Psych:   + anxiety, + depression, no nervousness, no unusual recent stress  Eyes:   no blurry vision, no floaters, no recent  vision changes, no glasses or contacts  ENT:   + hearing loss, no loose or painful teeth, full dentures.  Hematologic:  no easy bruising, no abnormal bleeding, no clotting disorder, no frequent epistaxis  Endocrine:  + diabetes, does  check CBG's at home     Physical Exam:   BP (!) 94/59 (BP Location: Left Arm, Patient Position: Sitting)   Pulse 81   Resp 20   Ht _0  (1.676 m)   Wt 200 lb (90.7 kg)   SpO2 94% Comment: RA  BMI 32.28 kg/m   General:  Chronically ill-appearing  HEENT:  Unremarkable, NCAT, PERLA, EOMI  Neck:   no JVD, no bruits, no adenopathy   Chest:   clear to auscultation, symmetrical breath sounds, no wheezes, no rhonchi   CV:   RRR, 3/6 systolic murmur RSB, no diastolic murmur  Abdomen:  soft, non-tender, no masses   Extremities:  warm, well-perfused, pulses palpable at ankle, + lower extremity edema in ankles  Rectal/GU  Deferred  Neuro:   Grossly non-focal and symmetrical throughout  Skin:   Clean and dry, no rashes, no breakdown  Diagnostic Tests:  ECHOCARDIOGRAM REPORT         Patient Name:   ADALEIGH WARF Date of Exam: 03/01/2021  Medical Rec #:  [id number]     Height:       66.0 in  Accession #:    [id number]    Weight:       200.0 lb  Date of Birth:  01-09-1946      BSA:          2.000 m  Patient Age:    57 years      BP:           129/72 mmHg  Patient Gender: F             HR:           89 bpm.  Exam Location:  [address]   Procedure: 2D Echo, Cardiac Doppler and Color Doppler  Indications:    Z95.3 AVR     History:        Patient has prior history of Echocardiogram examinations,  most                  recent 02/13/2021. CHF, CAD and Previous Myocardial  Infarction,                  COPD, Aortic Valve Disease, Arrythmias:AV block,                  Signs/Symptoms:Shortness of Breath; Risk  Factors:Hypertension,                  Sleep Apnea, Diabetes and Former Smoker. Anemia.                  Aortic Valve: 21 mm Yera Magna-Ease valve  is present in  the                  aortic position. Procedure Date: 10/12/13.     Sonographer:    Basilia J[facility], RDCS  Referring Phys: [id number] [place]      Sonographer Comments: Patient had significant shortness of breath during  the exam.  IMPRESSIONS     1. S/P AVR not well visualized; leaflets appear restricted; severe AS  with mean gradient 41 mmHg; severe MAC with mild MS.   2. Left ventricular ejection fraction, by estimation, is 60 to 65%. The  left ventricle has normal function. The left ventricle has no regional  wall motion abnormalities. There is severe left ventricular hypertrophy.  Left ventricular diastolic parameters   are indeterminate. Elevated left atrial pressure.   3. Right ventricular systolic function is normal. The right ventricular  size is normal.   4. Left atrial size was mildly dilated.   5. The mitral valve is normal in structure. Trivial mitral valve  regurgitation. Mild mitral stenosis. Severe mitral annular calcification.   6. The aortic valve has been repaired/replaced. Aortic valve  regurgitation is mild. Severe aortic valve stenosis. There is a 21 mm  Jun Magna-Ease valve present in the aortic position. Procedure Date:  10/12/13.   7. Aortic dilatation noted. There is mild dilatation of the aortic root,  measuring 40 mm. There is mild dilatation of the ascending aorta,  measuring 41 mm.   Comparison(s): TTE 02/13/21 EF 50-55%. AV 61mHg mean PG, 358mG peak PG.  TEE 11/03/20 EF 55-60%. AV 3243m mean PG, 74m45mpeak PG.   FINDINGS   Left Ventricle: Left ventricular ejection fraction, by estimation, is 60  to 65%. The left ventricle has normal function. The left ventricle has no  regional wall motion abnormalities. The left ventricular internal cavity  size was normal in size. There is   severe left ventricular hypertrophy. Left ventricular diastolic  parameters are indeterminate. Elevated left atrial pressure.   Right  Ventricle: The right ventricular size is normal. Right ventricular  systolic function is normal.   Left Atrium: Left atrial size was mildly dilated.   Right Atrium: Right atrial size was normal in size.   Pericardium: There is no evidence of pericardial effusion.   Mitral Valve: The mitral valve is normal in structure. Severe mitral  annular calcification. Trivial mitral valve regurgitation. Mild mitral  valve stenosis. MV peak gradient, 8.9 mmHg. The mean mitral valve gradient  is 4.0 mmHg.   Tricuspid Valve: The tricuspid valve is normal in structure. Tricuspid  valve regurgitation is trivial. No evidence of  tricuspid stenosis.   Aortic Valve: The aortic valve has been repaired/replaced. Aortic valve  regurgitation is mild. Severe aortic stenosis is present. Aortic valve  mean gradient measures 41.3 mmHg. Aortic valve peak gradient measures 70.4  mmHg. There is a 21 mm Crudup  Magna-Ease valve present in the aortic position. Procedure Date: 10/12/13.   Pulmonic Valve: The pulmonic valve was normal in structure. Pulmonic valve  regurgitation is trivial. No evidence of pulmonic stenosis.   Aorta: Aortic dilatation noted. There is mild dilatation of the aortic  root, measuring 40 mm. There is mild dilatation of the ascending aorta,  measuring 41 mm.   Venous: The inferior vena cava was not well visualized.   IAS/Shunts: The interatrial septum was not well visualized.   Additional Comments: S/P AVR not well visualized; leaflets appear  restricted; severe AS with mean gradient 41 mmHg; severe MAC with mild MS.      LEFT VENTRICLE  PLAX 2D  LVIDd:         3.80 cm Diastology  LVIDs:         2.70 cm LV e' medial:    5.25 cm/s  LV PW:         1.70 cm LV E/e' medial:  28.0  LV IVS:        1.70 cm LV e' lateral:   6.45 cm/s                         LV E/e' lateral: 22.8      RIGHT VENTRICLE  RV Basal diam:  3.10 cm  RV S prime:     6.60 cm/s   LEFT ATRIUM             Index        RIGHT ATRIUM           Index  LA diam:        4.70 cm 2.35 cm/m  RA Pressure: 3.00 mmHg  LA Vol (A2C):   67.3 ml 33.65 ml/m RA Area:     10.60 cm  LA Vol (A4C):   62.1 ml 31.05 ml/m RA Volume:   21.50 ml  10.75 ml/m  LA Biplane Vol: 65.2 ml 32.60 ml/m   AORTIC VALVE  AV Vmax:           419.67 cm/s  AV Vmean:          292.750 cm/s  AV VTI:            0.782 m  AV Peak Grad:      70.4 mmHg  AV Mean Grad:      41.3 mmHg  LVOT Vmax:         135.00 cm/s  LVOT Vmean:        91.200 cm/s  LVOT VTI:          0.264 m  LVOT/AV VTI ratio: 0.34     AORTA  Ao Root diam: 4.00 cm  Ao Asc diam:  4.10 cm   MITRAL VALVE                TRICUSPID VALVE                              Estimated RAP:  3.00 mmHg  MV Peak grad:  8.9 mmHg  MV Mean grad:  4.0 mmHg     SHUNTS  MV Vmax:  1.49 m/s     Systemic VTI: 0.26 m  MV Vmean:      97.9 cm/s  MV Decel Time: 250 msec  MV E velocity: 147.00 cm/s  MV A velocity: 175.00 cm/s  MV E/A ratio:  0.84   Kirk Ruths MD  Electronically signed by Kirk Ruths MD  Signature Date/Time: 03/01/2021/1:38:40 PM         Final     Physicians  Panel Physicians Referring Physician Case Authorizing Physician  Andrez Grime, MD (Primary)  Andrez Grime, MD  Yolonda Kida, MD (Assisting)     Procedures  RIGHT/LEFT HEART CATH AND CORONARY/GRAFT ANGIOGRAPHY   Conclusion      Mid LAD lesion is 100% stenosed.   Ost RCA lesion is 99% stenosed.   Ost RCA to Prox RCA lesion is 20% stenosed.   Prox RCA lesion is 80% stenosed.   Mid RCA lesion is 100% stenosed.   Prox Cx to Mid Cx lesion is 50% stenosed.   Previously placed Origin to Prox Graft stent (unknown type) is  widely patent.   Previously placed Dist Graft to Insertion stent (unknown type) is  widely patent.   Previously placed Prox Cx stent (unknown type) is  widely patent.   SVG and is large.   LIMA and is normal in caliber and large.   Patient agitation and frequent  movement made the procedure technically difficult.    Conclusion: Severe native three-vessel coronary artery disease including CTO of the mid LAD, CTO of the mid RCA, and patent stent in the mid left circumflex. Patent SVG to RCA with 2 patent stents in the proximal and distal segments. Patent LIMA to LAD RA 6, RV 38/5, PA 37/16 (25), PCWP 15 Fick CO equals 3.91, Fick CI 1.93   Recommendation Unable to cross the aortic valve during this procedure.  She needs additional evaluation to determine the severity of her aortic valve stenosis.  Likely we will perform a TEE next week. She has been appropriately diuresed with a RA pressure of 6 and pulmonary capillary wedge pressure 15.  I have ordered her home torsemide of 20 mg daily to be started tomorrow. Continue aspirin and Plavix as previously prescribed for stent placed in June 2022   Indications  NSTEMI (non-ST elevated myocardial infarction) ([place]) [I21.4 (ICD-10-CM)]   Procedural Details  Technical Details Cardiac Catheterization Procedure Note   Procedural details: The right groin was prepped, draped, and anesthetized with 1% lidocaine. Under ultrasound guidance and using modified Seldinger technique, a 5 French sheath was introduced into the right femoral artery. Standard Judkins catheters were used for coronary angiography (JR4 and JL5). SVG to RCA engaged using JR4. LIMA engaged using IMA. Catheter exchanges were performed over a guidewire.   Right heart catheterization was performed by placing a 83F sheath in right femoral vein using the modified Seldinger technique under ultrasound guidance.  A balloontipped catheter was then advanced through the right heart chambers and pressures were obtained.  A pulmonary artery saturation was obtained.  At the completion of the procedure the balloontipped catheter and the sheath were removed and manual pressure was held to achieve hemostasis.  There were no immediate procedural complications. The  patient was transferred to the post catheterization recovery area for further monitoring.     Estimated blood loss <50 mL.   During this procedure medications were administered to achieve and maintain moderate conscious sedation while the patient's heart rate, blood pressure, and oxygen saturation were continuously monitored  and I was present face-to-face 100% of this time.   Medications (Filter: Administrations occurring from 1441 to 1609 on 02/10/21)  important  Continuous medications are totaled by the amount administered until 02/10/21 1609.   midazolam (VERSED) injection (mg) Total dose:  1 mg Date/Time Rate/Dose/Volume Action   02/10/21 1454 0.5 mg Given   1456 0.5 mg Given    fentaNYL (SUBLIMAZE) injection (mcg) Total dose:  50 mcg Date/Time Rate/Dose/Volume Action   02/10/21 1454 25 mcg Given   1456 25 mcg Given    Heparin (Porcine) in NaCl 1000-0.9 UT/500ML-% SOLN (mL) Total volume:  1,000 mL Date/Time Rate/Dose/Volume Action   02/10/21 1455 1,000 mL Given    lidocaine (PF) (XYLOCAINE) 1 % injection (mL) Total volume:  10 mL Date/Time Rate/Dose/Volume Action   02/10/21 1455 10 mL Given    iohexol (OMNIPAQUE) 350 MG/ML injection (mL) Total volume:  100 mL Date/Time Rate/Dose/Volume Action   02/10/21 1557 100 mL Given    furosemide (LASIX) tablet 40 mg (mg) Total dose:  Cannot be calculated* Dosing weight:  97.1 *Administration dose not documented Date/Time Rate/Dose/Volume Action   02/10/21 1552 *Not included in total MAR Unhold    Sedation Time  Sedation Time Physician-1: 49 minutes 52 seconds Contrast  Medication Name Total Dose  iohexol (OMNIPAQUE) 350 MG/ML injection 100 mL   Radiation/Fluoro  Fluoro time: 21.3 (min) DAP: 57.2 (Gycm2) Cumulative Air Kerma: 824 (mGy) Complications  Complications documented before study signed (02/10/2021  2:35 PM)   No complications were associated with this study.  Documented by Andrez Grime, MD -  02/10/2021  4:17 PM     Coronary Findings  Diagnostic Dominance: Right Left Main  Vessel is large.  Left Anterior Descending  Mid LAD lesion is 100% stenosed. The lesion is chronically occluded.  First Diagonal Branch  Vessel is moderate in size. The vessel exhibits minimal luminal irregularities.  Lateral First Diagonal Branch  Vessel is small in size.  First Septal Branch  Vessel is moderate in size.  Second Septal Branch  Vessel is small in size.  Third Diagonal Branch  Vessel is small in size.  Third Septal Branch  Vessel is small in size.  Left Circumflex  The circumflex is patent. The stented segment previously treated with cutting balloon angioplasty demonstrates mild-moderate ISR estimated at 50%  Previously placed Prox Cx stent (unknown type) is widely patent.  Prox Cx to Mid Cx lesion is 50% stenosed.  Third Obtuse Marginal Branch  Vessel is moderate in size. Vessel is angiographically normal.  Right Coronary Artery  Vessel is large. The RCA is not selectively injected. The SVG-distal RCA is widely patent and the distal RCA filling in retrograde fashion demonstrated 95% stenosis proximal to the graft insertion site. Beyond the graft insertion site the RCA and it's branches are patent.  Ost RCA lesion is 99% stenosed. The lesion is focal.  Ost RCA to Prox RCA lesion is 20% stenosed. The lesion was previously treated using a stent (unknown type) over 2 years ago.  Prox RCA lesion is 80% stenosed.  Mid RCA lesion is 100% stenosed. The lesion is chronically occluded.  Right Posterior Descending Artery  Vessel is large in size. Vessel is angiographically normal.  Inferior Septal  Vessel is small in size.  Right Posterior Atrioventricular Artery  Vessel is angiographically normal.  First Right Posterolateral Branch  Vessel is small in size.  Second Right Posterolateral Branch  Vessel is small in size.  Third Right Posterolateral Branch  Vessel is small in size.   Saphenous Graft To Dist RCA  SVG and is large.  Previously placed Origin to Prox Graft stent (unknown type) is widely patent.  Previously placed Dist Graft to Insertion stent (unknown type) is widely patent.  LIMA LIMA Graft To Dist LAD  LIMA and is normal in caliber and large. Grade fills the major second diagonal branch. The graft is widely patent, fills the large diagonal in retrograde fashion, and the distal LAD beyond the graft insertion is patent to the apex.  Intervention  No interventions have been documented. Coronary Diagrams  Diagnostic Dominance: Right Intervention  Implants     Vascular Products  Device Closure Mynxgrip 60f- [id number] Implanted Inventory item: DEVICE CLOSURE MYNXGRIP 23F Model/Cat number: [id number] Manufacturer: [employer] Lot number: [id number] Device identifier: [id number] identifier type: GS1  Area Of Implantation: Groin    GUDID Information  Request status Successful    Brand name: MHouston Methodist Bay[facility] HospitalVersion/Model: [id number] Company name: [employer] MRI safety info as of 02/10/21: MR Safe  Contains dry or latex rubber: No    GMDN P.T. name: Wound hydrogel dressing, non-antimicrobial     As of 02/10/2021  Status: Implanted       Syngo Images   Show images for CARDIAC CATHETERIZATION Images on Long Term Storage   Show images for EEarlyn, [place] Procedure Log  Procedure Log    Hemo Data (last 6 days) before discharge  AO Systolic Cath Pressure AO Diastolic Cath Pressure AO Mean Cath Pressure PA Systolic Cath Pressure PA Diastolic Cath Pressure PA Mean Cath Pressure RA Wedge A Wave RA Wedge V Wave RV Systolic Cath Pressure RV Diastolic Cath Pressure RV End Diastolic RV Systolic RV End Diastolic RV dP/dt PCW A Wave PCW V Wave PCW Mean AO O2 Sat PA O2 Sat AO O2 Sat Fick C.O. Fick C.I.  -- -- -- -- -- -- 6 mmHg 6 mmHg -- -- -- 38 mmHg 5 mmHg 480 mmHg/sec 18 mmHg 15 mmHg 15 mmHg -- -- -- 3.91 L/min 1.93 L/min/m2  -- -- --  -- -- -- -- -- 38 mmHg 1 mmHg 5 mmHg -- -- -- -- -- -- -- -- -- -- --  -- -- -- 37 mmHg 16 mmHg 25 mmHg -- -- -- -- -- -- -- -- -- -- -- -- -- -- -- --  -- -- -- -- -- -- -- -- -- -- -- -- -- -- -- -- -- 97.9 % -- SA -- --  -- -- -- -- -- -- -- -- -- -- -- -- -- -- -- -- -- -- MV -- -- --  154 77 mmHg 103 mmHg -- -- -- -- -- -- -- --               ADDENDUM REPORT: 03/17/2021 16:18   CLINICAL DATA:  35F with 21 mm Babino Magna-Ease bioprosthetic aortic valve (placement 10/13/13), now with severe prosthetic valve stenosis being evaluated for TAVR valve in valve procedure.   EXAM: Cardiac TAVR CT   TECHNIQUE: The patient was scanned on a [employer] A 120 kV retrospective scan was triggered in the descending thoracic aorta at 111 HU's. Gantry rotation speed was 250 msecs and collimation was .6 mm. No beta blockade or nitro were given. The 3D data set was reconstructed in 5% intervals of the R-R cycle. Systolic and diastolic phases were analyzed on a dedicated work station using MPR, MIP  and VRT modes. The patient received 80 cc of contrast.   FINDINGS: Aortic Prosthesis: A 21 mm Carpentier-Renfrew Magna Ease prosthetic valve is present in the aortic position, procedure date 10/13/2013. The leaflets are thickened with restricted leaflet motion. There is an area of low attenuation (<90 HU) at [facility] concerning for leaflet thrombus   Valve in valve: A valve in valve analysis was performed using both a 65m and 218mEdwards Sapien 3 virtual transcatheter heart valve (THV).   Virtual THV to coronary ostia (VTC):   VTC LCA: The LCA ostium measures 46m55mrom the virtual THV using a 54m446mpien 3 valve, and 7mm 53m 20mm 5me   VTC RCA: The RCA ostium measures 4mm fr82mthe virtual THV using a 54mm Sa68m 3 valve, and 5mm for 146mm valv71mOptimal Fluoroscopic Angle for Delivery: LAO 4 CRA 3   Aortic sinus width: Left cusp - 31mm; Righ69msp - 28mm; Nonco39mry cusp - 28mm    Sino64mlar junction width: 30mm x 27mm  646mendi68morta: 38mm   Right at82m: Mild enlargement   Right ventricle: Mild dilatation   Pulmonary arteries: Dilated main PA measuring 346mm   Pulmonary44mns: Normal configuration   Left atrium: Moderate enlargement   Left ventricle: Severe hypertrophy, mild dilatation   Pericardium: Normal thickness   Coronary arteries: Patent LIMA-LAD and SVG-RCA   IMPRESSION: 1. 21 mm Carpentier-Loadholt Magna Ease prosthetic valve is present in the aortic position, procedure date 10/13/2013. The leaflets are thickened with restricted leaflet motion. There is an area of low attenuation (<90 HU) at [facility] concerning foBl[facility] Medical Centereaf[facility] thrombus   2. A valve in valve analysis was performed using both a 54mm and 20mm Edw47m Sapie1mvirtual transcatheter heart valve (THV). There is risk of coronary obstruction for the RCA, with RCA ostium measuring 4mm from the virtua57mHV using a 54mm Sapien 3 valve 62m for 20mm valve). 44mficien84mrtual THV to coronary ostia distance for left main, measuring 46mm from the virtual TH34msing a 54mm Sapien 3 valve (7mm32m 20mm valve).   3.69mtimal 25mroscopic Angle for Delivery: LAO 4 CRA 3   4. S/p CABG with patent LIMA-LAD and SVG-RCA     Electronically Signed   By: Christopher  Schumann M.D. Oswaldo Milian8    Addended by Schumann, Christopher L, MDDonato Heinz   Study Result  Narrative & Impression  EXAM: OVER-READ INTERPRETATION  CT CHEST   The following report is an over-read performed by radiologist Dr. Daniel Entrikin [facility]. This overBlanchardead does not include interpretation of cardiac or coronary anatomy or pathology. The coronary calcium score/coronary CTA interpretation by the cardiologist is attached.   COMPARISON:  Chest CTA 02/08/2021.   FINDINGS: Extracardiac findings will be described separately under dictation for contemporaneously  obtained CTA chest, abdomen and pelvis.   IMPRESSION: Please see separate dictation for contemporaneously obtained CTA chest, abdomen and pelvis dated 03/16/2021 for full description of relevant extracardiac findings.   Electronically Signed: By: Daniel  Entrikin M.D. On: 1Vinnie Langton  Narrative & Impression  CLINICAL DATA:  75 year old female with his21ry of severe aortic stenosis. Preprocedural study prior to potential transcatheter aortic valve replacement (TAVR) procedure.   EXAM: CT ANGIOGRAPHY CHEST, ABDOMEN AND PELVIS   TECHNIQUE: Multidetector CT imaging through the chest, abdomen and pelvis was performed using the standard protocol during bolus administration of intravenous contrast. Multiplanar reconstructed images and MIPs were obtained and reviewed  to evaluate the vascular anatomy.   CONTRAST:  128m OMNIPAQUE IOHEXOL 350 MG/ML SOLN   COMPARISON:  Chest CTA 02/08/2021. CT the abdomen and pelvis 10/09/2016.   FINDINGS: CTA CHEST FINDINGS   Cardiovascular: Heart size is mildly enlarged. There is no significant pericardial fluid, thickening or pericardial calcification. There is aortic atherosclerosis, as well as atherosclerosis of the great vessels of the mediastinum and the coronary arteries, including calcified atherosclerotic plaque in the left main, left anterior descending, left circumflex and right coronary arteries. Status post median sternotomy for CABG including LIMA to the LAD as well as aortic valve replacement with what appears to be a stented bioprosthesis which demonstrates some calcifications of the cusps. Dilatation of the pulmonic trunk (3.9 cm in diameter), concerning for pulmonary arterial hypertension.   Mediastinum/Lymph Nodes: No pathologically enlarged mediastinal or hilar lymph nodes. Esophagus is unremarkable in appearance. No axillary lymphadenopathy.   Lungs/Pleura: Widespread but patchy areas of ground-glass attenuation and  interlobular septal thickening are noted in the lungs bilaterally, with intervening areas of lucency. Overall, the appearance suggests a background of interstitial pulmonary edema, likely with some air trapping from small airways disease. No confluent consolidative airspace disease. No pleural effusions. No definite suspicious appearing pulmonary nodules or masses are noted.   Musculoskeletal/Soft Tissues: Status post median sternotomy with residual sternotomy wires in the manubrium. There are no aggressive appearing lytic or blastic lesions noted in the visualized portions of the skeleton.   CTA ABDOMEN AND PELVIS FINDINGS   Hepatobiliary: Diffuse low attenuation throughout the hepatic parenchyma, indicative of a background of hepatic steatosis. Liver has a nodular contour, indicative of underlying cirrhosis. No discrete cystic or solid hepatic lesions. No intra or extrahepatic biliary ductal dilatation. Status post cholecystectomy.   Pancreas: No pancreatic mass. No pancreatic ductal dilatation. No pancreatic or peripancreatic fluid collections or inflammatory changes.   Spleen: Unremarkable.   Adrenals/Urinary Tract: 1.3 x 1.0 cm right adrenal nodule which measures 8 HU on prior noncontrast CT dated 02/01/2021, compatible with a small adrenal adenoma. Left adrenal gland is normal in appearance. Mild multifocal cortical thinning in the kidneys bilaterally. No suspicious renal lesions. No hydroureteronephrosis. Urinary bladder is normal in appearance.   Stomach/Bowel: The appearance of the stomach is normal. There is no pathologic dilatation of small bowel or colon. Numerous colonic diverticulae are noted, particularly in the descending colon and sigmoid colon, without surrounding inflammatory changes to suggest an acute diverticulitis at this time. The appendix is not confidently identified and may be surgically absent. Regardless, there are no inflammatory changes noted  adjacent to the cecum to suggest the presence of an acute appendicitis at this time.   Vascular/Lymphatic: Aortic atherosclerosis with vascular findings and measurements pertinent to potential TAVR procedure, as detailed below. Multifocal fusiform ectasia of the infrarenal abdominal aorta which measures up to 2.8 x 2.7 cm. Notably, in the region of the infrarenal abdominal aorta (axial images 112-117 of series 6) there appears to be multiple short segment dissection flaps which demonstrate partial calcification. No surrounding inflammatory changes or soft tissue thickening. No lymphadenopathy identified in the abdomen or pelvis.   Reproductive: Status post hysterectomy. Ovaries are not confidently identified may be surgically absent or atrophic.   Other: No significant volume of ascites.  No pneumoperitoneum.   Musculoskeletal: There are no aggressive appearing lytic or blastic lesions noted in the visualized portions of the skeleton.   VASCULAR MEASUREMENTS PERTINENT TO TAVR:   AORTA:   Minimal Aortic Diameter-14 x 13 mm  Severity of Aortic Calcification-moderate   RIGHT PELVIS:   Right Common Iliac Artery -   Minimal Diameter-11.0 x 8.2 mm   Tortuosity-moderate   Calcification-moderate   Right External Iliac Artery -   Minimal Diameter-8.3 x 7.8 mm   Tortuosity-moderate   Calcification-none   Right Common Femoral Artery -   Minimal Diameter-8.6 x 8.5 mm   Tortuosity-mild   Calcification-mild   LEFT PELVIS:   Left Common Iliac Artery -   Minimal Diameter-10.4 x 9.9 mm   Tortuosity-mild   Calcification-moderate   Left External Iliac Artery -   Minimal Diameter-8.5 x 8.8 mm   Tortuosity-mild   Calcification-minimal   Left Common Femoral Artery -   Minimal Diameter-9.1 x 6.9 mm   Tortuosity-mild   Calcification-mild-to-moderate   Review of the MIP images confirms the above findings.   IMPRESSION: 1. Vascular findings and measurements  pertinent to potential TAVR procedure, as detailed above. Please take note of the fusiform ectasia of the infrarenal abdominal aorta which has multiple short segment chronic dissection flaps in this region, which could have implications at the time of catheter manipulation during TAVR. 2. Status post aortic valve replacement with what appears to be a stented bioprosthesis which demonstrates calcifications of the prosthetic cusps, compatible with reported clinical history of severe aortic stenosis. 3. The appearance of the lungs suggests a combination of interstitial pulmonary edema and probable air trapping from small airways disease. 4. Mild cardiomegaly. 5. Dilatation of the pulmonic trunk (3.9 cm in diameter), concerning for pulmonary arterial hypertension. 6. Cirrhotic liver with evidence of hepatic steatosis. 7. Small right adrenal adenoma. 8. Colonic diverticulosis without evidence of acute diverticulitis at this time. 9. Additional incidental findings, as above.     Electronically Signed   By: Vinnie Langton M.D.   On: 03/16/2021 14:12     ADDENDUM: 83M walk test added  Pre Surgical Assessment: 5 M Walk Test  83M=16.56f  5 Meter Walk Test- trial 1: 18.2 seconds 5 Meter Walk Test- trial 2: 16.3 seconds 5 Meter Walk Test- trial 3: 17.1 seconds 5 Meter Walk Test Average: 17.2 seconds  STS Risk Score: Risk of Mortality: 9.704% Renal Failure: 7.868% Permanent Stroke: 3.749% Prolonged Ventilation: 23.484% DSW Infection: 0.210% Reoperation: 5.389% Morbidity or Mortality: 28.790% Short Length of Stay: 14.241% Long Length of Stay: 11.678% Impression:  This 75year old woman has stage D, severe, symptomatic aortic stenosis with New York Heart Association class III-IV symptoms of exertional fatigue and shortness of breath with some symptoms at rest.  I suspect that her shortness of breath is multifactorial due to obesity, severe COPD, diastolic heart failure and  severe prosthetic aortic stenosis.  I have personally reviewed her 2D echocardiogram, cardiac catheterization, and CTA studies.  Her echocardiogram shows a mean gradient of 41 mmHg across the prosthetic aortic valve with restricted leaflet mobility.  Left ventricular systolic function is normal with severe LVH.  Cardiac catheterization showed severe native three-vessel coronary disease with chronic total occlusion of the mid LAD and mid RCA and a patent stent in the mid left circumflex.  The saphenous vein graft to the RCA is patent with 2 stents.  There is a patent LIMA to the LAD.  PA pressure is 37/16 with a wedge pressure of 15 and a cardiac index of 1.93.  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  I do not think she is a candidate  for redo open surgical aortic valve replacement.  She has a 21 mm Magna-Ease which is likely suitable for a 23 mm Sporn SAPIEN 3 valve.  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  The femoral and iliac vessels appear of adequate size to allow transfemoral insertion.  The patient and her husband were counseled at length regarding treatment alternatives for management of severe symptomatic aortic stenosis. The risks and benefits of surgical intervention has been discussed in detail. Long-term prognosis with medical therapy was discussed. Alternative approaches such as conventional surgical aortic valve replacement, transcatheter aortic valve replacement, and palliative medical therapy were compared and contrasted at length. This discussion was placed in the context of the patient's own specific clinical presentation and past medical history. All of their questions have been addressed.   Following the decision to proceed with transcatheter aortic valve  replacement, a discussion was held regarding what types of management strategies would be attempted intraoperatively in the event of life-threatening complications, including whether or not the patient would be considered a candidate for the use of cardiopulmonary bypass and/or conversion to open sternotomy for attempted surgical intervention.  I do not think she is a candidate for emergent sternotomy to manage any intraoperative complications.  The patient is aware of the fact that transient use of cardiopulmonary bypass may be necessary. The patient has been advised of a variety of complications that might develop including but not limited to risks of death, stroke, paravalvular leak, aortic dissection or other major vascular complications, aortic annulus rupture, device embolization, cardiac rupture or perforation, mitral regurgitation, acute myocardial infarction, arrhythmia, heart block or bradycardia requiring permanent pacemaker placement, congestive heart failure, respiratory failure, renal failure, pneumonia, infection, other late complications related to structural valve deterioration or migration, or other complications that might ultimately cause a temporary or permanent loss of functional independence or other long term morbidity. The patient provides full informed consent for the procedure as described and all questions were answered.      Plan:  She will be scheduled for valve in valve TAVR using a SAPIEN 3 valve on 04/18/2021   I spent 60 minutes performing this consultation and > 50% of this time was spent face to face counseling and coordinating the care of this patient's severe symptomatic aortic stenosis.  [name], MD 04/12/2021

## 2021-04-13 ENCOUNTER — Other Ambulatory Visit: Payer: Self-pay

## 2021-04-13 DIAGNOSIS — I350 Nonrheumatic aortic (valve) stenosis: Secondary | ICD-10-CM

## 2021-04-13 DIAGNOSIS — R0600 Dyspnea, unspecified: Secondary | ICD-10-CM

## 2021-04-13 NOTE — Progress Notes (Signed)
Surgical Instructions    Your procedure is scheduled on 04/18/21.  Report to [facility] Main Entrance "A" at 7:15 A.M., then check in with the Admitting office.  Call this number if you have problems the morning of surgery:  [phone]   If you have any questions prior to your surgery date call [phone]: Open Monday-Friday 8am-4pm    Remember:  Do not eat or drink after midnight the night before your surgery     Take these medicines the morning of surgery with A SIP OF WATER: levothyroxine (SYNTHROID, LEVOTHROID)  AS NEEDED: ANORO ELLIPTA  Continue taking all medications including Aspirin and Plavix without change through the day before surgery. (Follow Diabetes Medications below)   As of today, STOP taking  Aleve, Naproxen, Ibuprofen, Motrin, Advil, Goody's, BC's, all herbal medications, fish oil, and all vitamins.  WHAT DO I DO ABOUT MY DIABETES MEDICATION?    THE NIGHT BEFORE SURGERY, DO NOT take Insulin Glargine (Lantus)      THE MORNING OF SURGERY, DO NOT take Insulin Humalog  The day of surgery, do not take other diabetes injectables, including Byetta (exenatide), Bydureon (exenatide ER), Victoza (liraglutide), or Trulicity (dulaglutide).  If your CBG is greater than 220 mg/dL, you may take  of your sliding scale (correction) dose of insulin.   HOW TO MANAGE YOUR DIABETES BEFORE AND AFTER SURGERY  Why is it important to control my blood sugar before and after surgery? Improving blood sugar levels before and after surgery helps healing and can limit problems. A way of improving blood sugar control is eating a healthy diet by:  Eating less sugar and carbohydrates  Increasing activity/exercise  Talking with your doctor about reaching your blood sugar goals High blood sugars (greater than 180 mg/dL) can raise your risk of infections and slow your recovery, so you will need to focus on controlling your diabetes during the weeks before surgery. Make sure that  the doctor who takes care of your diabetes knows about your planned surgery including the date and location.  How do I manage my blood sugar before surgery? Check your blood sugar at least 4 times a day, starting 2 days before surgery, to make sure that the level is not too high or low.  Check your blood sugar the morning of your surgery when you wake up and every 2 hours until you get to the Short Stay unit.  If your blood sugar is less than 70 mg/dL, you will need to treat for low blood sugar: Do not take insulin. Treat a low blood sugar (less than 70 mg/dL) with  cup of clear juice (cranberry or apple), 4 glucose tablets, OR glucose gel. Recheck blood sugar in 15 minutes after treatment (to make sure it is greater than 70 mg/dL). If your blood sugar is not greater than 70 mg/dL on recheck, call [phone] for further instructions. Report your blood sugar to the short stay nurse when you get to Short Stay.  If you are admitted to the hospital after surgery: Your blood sugar will be checked by the staff and you will probably be given insulin after surgery (instead of oral diabetes medicines) to make sure you have good blood sugar levels. The goal for blood sugar control after surgery is 80-180 mg/dL.      After your COVID test   You are not required to quarantine however you are required to wear a well-fitting mask when you are out and around people not in your household.  If  your mask becomes wet or soiled, replace with a new one.  Wash your hands often with soap and water for 20 seconds or clean your hands with an alcohol-based hand sanitizer that contains at least 60% alcohol.  Do not share personal items.  Notify your provider: if you are in close contact with someone who has COVID  or if you develop a fever of 100.4 or greater, sneezing, cough, sore throat, shortness of breath or body aches.           Do not wear jewelry or makeup Do not wear lotions, powders, perfumes or  deodorant. Do not shave 48 hours prior to surgery.   Do not bring valuables to the hospital. DO Not wear nail polish, gel polish, artificial nails, or any other type of covering on natural nails including finger and toenails. If patients have artificial nails, gel coating, etc. that need to be removed by a nail salon, please have this removed prior to surgery or surgery may need to be canceled/delayed if the surgeon/ anesthesia feels like the patient is unable to be adequately monitored.             [name] is not responsible for any belongings or valuables.  Do NOT Smoke (Tobacco/Vaping)  24 hours prior to your procedure  If you use a CPAP at night, you may bring your mask for your overnight stay.   Contacts, glasses, hearing aids, dentures or partials may not be worn into surgery, please bring cases for these belongings   For patients admitted to the hospital, discharge time will be determined by your treatment team.   Patients discharged the day of surgery will not be allowed to drive home, and someone needs to stay with them for 24 hours.  NO VISITORS WILL BE ALLOWED IN PRE-OP WHERE PATIENTS ARE PREPPED FOR SURGERY.  ONLY 1 SUPPORT PERSON MAY BE PRESENT IN THE WAITING ROOM WHILE YOU ARE IN SURGERY.  IF YOU ARE TO BE ADMITTED, ONCE YOU ARE IN YOUR ROOM YOU WILL BE ALLOWED TWO (2) VISITORS. 1 (ONE) VISITOR MAY STAY OVERNIGHT BUT MUST ARRIVE TO THE ROOM BY 8pm.  Minor children may have two parents present. Special consideration for safety and communication needs will be reviewed on a case by case basis.  Special instructions:    Oral Hygiene is also important to reduce your risk of infection.  Remember - BRUSH YOUR TEETH THE MORNING OF SURGERY WITH YOUR REGULAR TOOTHPASTE   - Preparing For Surgery  Before surgery, you can play an important role. Because skin is not sterile, your skin needs to be as free of germs as possible. You can reduce the number of germs on your skin  by washing with CHG (chlorahexidine gluconate) Soap before surgery.  CHG is an antiseptic cleaner which kills germs and bonds with the skin to continue killing germs even after washing.     Please do not use if you have an allergy to CHG or antibacterial soaps. If your skin becomes reddened/irritated stop using the CHG.  Do not shave (including legs and underarms) for at least 48 hours prior to first CHG shower. It is OK to shave your face.  Please follow these instructions carefully.     Shower the NIGHT BEFORE SURGERY and the MORNING OF SURGERY with CHG Soap.   If you chose to wash your hair, wash your hair first as usual with your normal shampoo. After you shampoo, rinse your hair and body thoroughly to  remove the shampoo.  Then [employer] and genitals (private parts) with your normal soap and rinse thoroughly to remove soap.  After that Use CHG Soap as you would any other liquid soap. You can apply CHG directly to the skin and wash gently with a scrungie or a clean washcloth.   Apply the CHG Soap to your body ONLY FROM THE NECK DOWN.  Do not use on open wounds or open sores. Avoid contact with your eyes, ears, mouth and genitals (private parts). Wash Face and genitals (private parts)  with your normal soap.   Wash thoroughly, paying special attention to the area where your surgery will be performed.  Thoroughly rinse your body with warm water from the neck down.  DO NOT shower/wash with your normal soap after using and rinsing off the CHG Soap.  Pat yourself dry with a CLEAN TOWEL.  Wear CLEAN PAJAMAS to bed the night before surgery  Place CLEAN SHEETS on your bed the night before your surgery  DO NOT SLEEP WITH PETS.   Day of Surgery:  Take a shower with CHG soap. Wear Clean/Comfortable clothing the morning of surgery Do not apply any deodorants/lotions.   Remember to brush your teeth WITH YOUR REGULAR TOOTHPASTE.   Please read over the following fact sheets that you were  given.

## 2021-04-14 ENCOUNTER — Encounter (HOSPITAL_COMMUNITY): Payer: Self-pay

## 2021-04-14 ENCOUNTER — Other Ambulatory Visit: Payer: Self-pay

## 2021-04-14 ENCOUNTER — Encounter (HOSPITAL_COMMUNITY)
Admission: RE | Admit: 2021-04-14 | Discharge: 2021-04-14 | Disposition: A | Discharge status: Home / Self Care | Payer: Medicare HMO | Source: Ambulatory Visit | Attending: Cardiovascular Disease | Admitting: Cardiovascular Disease

## 2021-04-14 ENCOUNTER — Ambulatory Visit (HOSPITAL_COMMUNITY)
Admission: RE | Admit: 2021-04-14 | Discharge: 2021-04-14 | Disposition: A | Discharge status: Home / Self Care | Payer: Medicare HMO | Source: Ambulatory Visit | Attending: Cardiovascular Disease | Admitting: Cardiovascular Disease

## 2021-04-14 VITALS — BP 111/62 | HR 81 | Temp 98.5°F | Resp 18 | Ht 66.0 in | Wt 210.6 lb

## 2021-04-14 DIAGNOSIS — R0600 Dyspnea, unspecified: Secondary | ICD-10-CM

## 2021-04-14 DIAGNOSIS — Z01818 Encounter for other preprocedural examination: Secondary | ICD-10-CM | POA: Diagnosis not present

## 2021-04-14 DIAGNOSIS — I350 Nonrheumatic aortic (valve) stenosis: Secondary | ICD-10-CM | POA: Insufficient documentation

## 2021-04-14 DIAGNOSIS — Z20822 Contact with and (suspected) exposure to covid-19: Secondary | ICD-10-CM | POA: Insufficient documentation

## 2021-04-14 LAB — URINALYSIS, ROUTINE W REFLEX MICROSCOPIC
Bilirubin Urine: NEGATIVE
Glucose, UA: NEGATIVE mg/dL
Hgb urine dipstick: NEGATIVE
Ketones, ur: NEGATIVE mg/dL
Leukocytes,Ua: NEGATIVE
Nitrite: NEGATIVE
Protein, ur: NEGATIVE mg/dL
Specific Gravity, Urine: 1.006 (ref 1.005–1.030)
pH: 5 (ref 5.0–8.0)

## 2021-04-14 LAB — BLOOD GAS, ARTERIAL
Acid-base deficit: 0.5 mmol/L (ref 0.0–2.0)
Bicarbonate: 23.9 mmol/L (ref 20.0–28.0)
Drawn by: 58793
FIO2: 0.21
O2 Saturation: 99.3 %
Patient temperature: 37
pCO2 arterial: 41.1 mmHg (ref 32.0–48.0)
pH, Arterial: 7.383 (ref 7.350–7.450)
pO2, Arterial: 137 mmHg — ABNORMAL HIGH (ref 83.0–108.0)

## 2021-04-14 LAB — GLUCOSE, CAPILLARY: Glucose-Capillary: 223 mg/dL — ABNORMAL HIGH (ref 70–99)

## 2021-04-14 LAB — CBC
HCT: 26.8 % — ABNORMAL LOW (ref 36.0–46.0)
Hemoglobin: 8.1 g/dL — ABNORMAL LOW (ref 12.0–15.0)
MCH: 26.5 pg (ref 26.0–34.0)
MCHC: 30.2 g/dL (ref 30.0–36.0)
MCV: 87.6 fL (ref 80.0–100.0)
Platelets: 191 10*3/uL (ref 150–400)
RBC: 3.06 MIL/uL — ABNORMAL LOW (ref 3.87–5.11)
RDW: 15.2 % (ref 11.5–15.5)
WBC: 9.2 10*3/uL (ref 4.0–10.5)
nRBC: 0 % (ref 0.0–0.2)

## 2021-04-14 LAB — COMPREHENSIVE METABOLIC PANEL
ALT: 13 U/L (ref 0–44)
AST: 21 U/L (ref 15–41)
Albumin: 2.9 g/dL — ABNORMAL LOW (ref 3.5–5.0)
Alkaline Phosphatase: 74 U/L (ref 38–126)
Anion gap: 9 (ref 5–15)
BUN: 23 mg/dL (ref 8–23)
CO2: 23 mmol/L (ref 22–32)
Calcium: 8.1 mg/dL — ABNORMAL LOW (ref 8.9–10.3)
Chloride: 102 mmol/L (ref 98–111)
Creatinine, Ser: 1.28 mg/dL — ABNORMAL HIGH (ref 0.44–1.00)
GFR, Estimated: 43 mL/min — ABNORMAL LOW (ref >60)
Glucose, Bld: 179 mg/dL — ABNORMAL HIGH (ref 70–99)
Potassium: 4.5 mmol/L (ref 3.5–5.1)
Sodium: 134 mmol/L — ABNORMAL LOW (ref 135–145)
Total Bilirubin: 0.8 mg/dL (ref 0.3–1.2)
Total Protein: 6.1 g/dL — ABNORMAL LOW (ref 6.5–8.1)

## 2021-04-14 LAB — BRAIN NATRIURETIC PEPTIDE: B Natriuretic Peptide: 427.9 pg/mL — ABNORMAL HIGH (ref 0.0–100.0)

## 2021-04-14 LAB — PROTIME-INR
INR: 1.1 (ref 0.8–1.2)
Prothrombin Time: 14.2 seconds (ref 11.4–15.2)

## 2021-04-14 LAB — HEMOGLOBIN A1C
Hgb A1c MFr Bld: 6.2 % — ABNORMAL HIGH (ref 4.8–5.6)
Mean Plasma Glucose: 131.24 mg/dL

## 2021-04-14 LAB — SURGICAL PCR SCREEN
MRSA, PCR: NEGATIVE
Staphylococcus aureus: POSITIVE — AB

## 2021-04-14 LAB — SARS CORONAVIRUS 2 (TAT 6-24 HRS): SARS Coronavirus 2: NEGATIVE

## 2021-04-14 NOTE — Progress Notes (Addendum)
PCP: Emily Filbert, MD Cardiologist: Serafina Royals, MD Pulmonologist: Dr. Lanney Gins  EKG: 04/14/21 CXR: 04/14/21 ECHO: 03/01/21 Stress Test: 01/19/09 Cardiac Cath: 02/10/21  Fasting Blood Sugar- does not check glucose Checks Blood Sugar_0__ times a day  OSA/CPAP: Yes, does not wear cpap.  Wears 2-3L 02 continuous.  ASA/Plavix: Continue through day before surgery  Covid test 04/14/21 at PAT  Anesthesia Review: yes, cardiac history.  Patient c/o SOBf in PAT.  02 sats 95-97 on RA.  Discussed with Jeneen Rinks, [place].  Placed patient on 2L 02.  Patient apprears anxious. Abnormal EKG   Patient denies shortness of breath, fever, cough, and chest pain at PAT appointment.  Patient verbalized understanding of instructions provided today at the PAT appointment.  Patient asked to review instructions at home and day of surgery.

## 2021-04-17 MED ORDER — NOREPINEPHRINE 4 MG/250ML-% IV SOLN
0.0000 ug/min | INTRAVENOUS | Status: AC
  Administered 2021-04-18: 2 ug/min via INTRAVENOUS
  Filled 2021-04-17: qty 250

## 2021-04-17 MED ORDER — CEFAZOLIN SODIUM-DEXTROSE 2-4 GM/100ML-% IV SOLN
2.0000 g | INTRAVENOUS | Status: AC
  Administered 2021-04-18: 2 g via INTRAVENOUS
  Filled 2021-04-17 (×3): qty 100

## 2021-04-17 MED ORDER — HEPARIN 30,000 UNITS/1000 ML (OHS) CELLSAVER SOLUTION
Status: DC
  Filled 2021-04-17 (×2): qty 1000

## 2021-04-17 MED ORDER — DEXMEDETOMIDINE HCL IN NACL 400 MCG/100ML IV SOLN
0.1000 ug/kg/h | INTRAVENOUS | Status: AC
  Administered 2021-04-18: 1 ug/kg/h via INTRAVENOUS
  Filled 2021-04-17 (×2): qty 100

## 2021-04-17 MED ORDER — POTASSIUM CHLORIDE 2 MEQ/ML IV SOLN
80.0000 meq | INTRAVENOUS | Status: DC
  Filled 2021-04-17 (×2): qty 40

## 2021-04-17 MED ORDER — MAGNESIUM SULFATE 50 % IJ SOLN
40.0000 meq | INTRAMUSCULAR | Status: DC
  Filled 2021-04-17 (×2): qty 9.85

## 2021-04-17 NOTE — H&P (Signed)
[address]       [place] [address]             [phone]      Cardiothoracic Surgery Admission History and Physical   PCP is Rusty Aus, MD  Referring Provider is [name], MD  Primary Cardiologist is [name], MD     Reason for admission: Severe aortic stenosis     HPI:     The patient is a 75 year old woman with a history of type 2 diabetes, hypertension, hyperlipidemia, severe COPD on home oxygen, anemia, coronary artery disease status post stenting of the RCA and left circumflex in 2009 followed by AVR and CABG by me in 2015.  She had a left internal mammary graft to the LAD and a saphenous vein graft to the right coronary artery with a 21 mm Holbein Magna-Ease pericardial valve.  She was admitted to [facility] in 2017 with unstable angina and cardiac catheterization showed a patent LIMA to the LAD and patent saphenous vein graft to the RCA with progression of in-stent restenosis in the proximal left circumflex that was treated with Cutting Balloon angioplasty.  She presented again in April 2019 feeling poorly with chest pressure, shortness of breath, fatigue, and dizziness.  Repeat catheterization showed patent bypass grafts and a patent left circumflex stent with mild restenosis.  Echo showed a normal functioning bioprosthetic aortic valve at that time.  She was admitted to [facility] with unstable angina in June 2022 and had stenting of the left circumflex and the saphenous vein graft to the RCA.  Repeat catheterization on 02/10/2021 showed stable coronary disease with chronic total occlusion of the mid LAD and mid RCA with patent left circumflex stents with moderately severe distal AV groove stenosis with both bypass grafts patent.  Echo on 02/13/2021 showed severe LVH.  Ejection fraction was 50 to 55%.  The bioprosthetic aortic valve was poorly visualized but appeared calcified and stenotic.  Her most recent echocardiogram on  03/01/2021 showed severe LVH with an ejection fraction of 60 to 65%.  The prosthetic aortic valve had severe stenosis with a mean gradient of 41 mmHg and a peak gradient of 70 mmHg.  She continues to report severe shortness of breath with any activity as well as at rest.  She said that she is mostly sedentary at home because she cannot get up and walk without getting short of breath.  She wears oxygen day and night.  She reports occasional chest pains.  She has had no dizziness or syncope.  She denies lower extremity edema.     Her husband came with her and said that she has been worsening.  He has also noted significant memory issues.          Past Medical History:    Diagnosis   Date       1st degree AV block           ACE-inhibitor cough           Allergic rhinitis           Anemia            iron deficiency anemia       Anxiety           Aortic ectasia (HCC)            a. CT abd in 12/2016 incidentally noted aortic atherosclerosis and infrarenal abdominal aortic ectasia measuring as large as 2.7 cm with  recommendation to repeat US in 2023.       Arthritis           Asthma           Cataract           Chronic depression           Chronic diastolic CHF (congestive heart failure) (HCC)           Chronic headache           COPD (chronic obstructive pulmonary disease) ([place])           Coronary artery disease            a. DES to RCA and mid Cx 2009. b. CABG and bioprosthetic AVR May 2015. c. cutting balloon to prox Cx in 05/2016       Diabetes mellitus            type 2       Diverticulitis of colon           Essential hypertension           GERD (gastroesophageal reflux disease)           Hearing loss           History of blood transfusion   2013       History of prosthetic aortic valve replacement           HOH (hard of hearing)            Hypercholesterolemia            intolerance of statins and niaspan       IDA (iron deficiency anemia)   02/03/2019       Mobitz type 1 second degree AV block           OSA (obstructive sleep apnea)            mild, intolerant of cpap       PAD (peripheral artery disease) ([place])            a. atherosclerosis by CT abd 12/2016 in [place].       PONV (postoperative nausea and vomiting)           Statin intolerance           Thyroid disease                    Past Surgical History:    Procedure   Laterality   Date       ABDOMINAL HYSTERECTOMY               ABDOMINAL HYSTERECTOMY W/ PARTIAL VAGINACTOMY               AORTIC VALVE REPLACEMENT   N/A   10/12/2013        Procedure: AORTIC VALVE REPLACEMENT (AVR);  Surgeon: [name], MD;  Location: [place];  Service: Open Heart Surgery;  Laterality: N/A;       APPENDECTOMY       1964       BARTHOLIN GLAND CYST EXCISION               BLADDER SUSPENSION               BREAST BIOPSY   Bilateral   09/11/2000        neg       BREAST BIOPSY   Left   07/24/2010  neg       BREAST CYST EXCISION       1988        bilateral nonmalignant tumors, x3       CARDIAC CATHETERIZATION               CARDIAC CATHETERIZATION   N/A   05/25/2016        Procedure: Coronary Balloon Angioplasty;  Surgeon: Leonie Man, MD;  Location: [facility];  Service: Cardiovascular;  Laterality: N/A;       CARDIAC CATHETERIZATION   N/A   05/25/2016        Procedure: Coronary/Graft Angiography;  Surgeon: Leonie Man, MD;  Location: [place] [facility];  Service: Cardiovascular;  Laterality: N/A;       CATARACT EXTRACTION W/ INTRAOCULAR LENS  IMPLANT, BILATERAL               CHOLECYSTECTOMY       2001       COLECTOMY                lap sigmoid        COLONOSCOPY       2014        polyps found, 2 clamped off.       CORONARY ANGIOPLASTY       10/29/2007        Prox RCA & Mid Cx.       CORONARY ARTERY BYPASS GRAFT   N/A   10/12/2013        Procedure: CORONARY ARTERY BYPASS GRAFT TIMES TWO;  Surgeon: [name], MD;  Location: [place] [facility];  Service: Open Heart Surgery;  Laterality: N/A;       CORONARY/GRAFT ANGIOGRAPHY   N/A   09/20/2017        Procedure: CORONARY/GRAFT ANGIOGRAPHY;  Surgeon: Sherren Mocha, MD;  Location: [place] [facility];  Service: Cardiovascular;  Laterality: N/A;       LEFT HEART CATHETERIZATION WITH CORONARY ANGIOGRAM   N/A   10/09/2013        Procedure: LEFT HEART CATHETERIZATION WITH CORONARY ANGIOGRAM;  Surgeon: [name], MD;  Location: [facility];  Service: Cardiovascular;  Laterality: N/A;       RIGHT/LEFT HEART CATH AND CORONARY ANGIOGRAPHY   N/A   11/03/2020        Procedure: RIGHT/LEFT HEART CATH AND CORONARY ANGIOGRAPHY;  Surgeon: Corey Skains, MD;  Location: [place] [facility];  Service: Cardiovascular;  Laterality: N/A;       RIGHT/LEFT HEART CATH AND CORONARY/GRAFT ANGIOGRAPHY   N/A   02/10/2021        Procedure: RIGHT/LEFT HEART CATH AND CORONARY/GRAFT ANGIOGRAPHY;  Surgeon: Andrez Grime, MD;  Location: [place] [facility];  Service: Cardiovascular;  Laterality: N/A;       STERNAL WIRES REMOVAL   N/A   04/13/2014        Procedure: STERNAL WIRES REMOVAL;  Surgeon: [name], MD;  Location: [place];  Service: Thoracic;  Laterality: N/A;       TEE WITHOUT CARDIOVERSION   N/A   11/03/2020        Procedure: TRANSESOPHAGEAL ECHOCARDIOGRAM (TEE);  Surgeon: Corey Skains, MD;  Location: [facility];  Service: Cardiovascular;  Laterality: N/A;       TEE WITHOUT CARDIOVERSION   N/A   02/13/2021        Procedure: TRANSESOPHAGEAL ECHOCARDIOGRAM (TEE);  Surgeon: Corey Skains, MD;  Location:  [facility];  Service: Cardiovascular;  Laterality: N/A;       THYROIDECTOMY               TONSILLECTOMY               TUBAL LIGATION               VAGINAL DELIVERY                3       VISCERAL ARTERY INTERVENTION   N/A   08/16/2016        Procedure: Visceral Artery Intervention;  Surgeon: Algernon Huxley, MD;  Location: [place] [facility];  Service: Cardiovascular;  Laterality: N/A;                Family History    Problem   Relation   Age of Onset       Breast cancer   Mother   75       Hypertension   Father           Mesothelioma   Father           Asthma   Father           Stroke   Paternal Grandfather           Heart disease   Other           Breast cancer   Maternal Aunt           Breast cancer   Paternal Aunt               Social History             Socioeconomic History       Marital status:   Married            Spouse name:   Not on file       Number of children:   3       Years of education:   Not on file       Highest education level:   Not on file    Occupational History       Occupation:   Retired            Fish farm manager:   UNEMPLOYED            Comment: CNA    Tobacco Use       Smoking status:   Former            Packs/day:   0.50            Years:   30.00            Pack years:   15.00            Types:   Cigarettes            Quit date:   10/02/2013            Years since quitting:   7.5       Smokeless tobacco:   Never    Vaping Use       Vaping Use:   Never used    Substance and Sexual Activity       Alcohol use:   No       Drug use:   No       Sexual activity:   Not Currently    Other Topics   Concern  Not on file    Social History Narrative         Does not have Living Will        Desires CPR, would not want prolonged life support if futile.        Social Determinants of Health        Financial Resource Strain: Not on file    Food Insecurity: Not on file    Transportation Needs: Not on file    Physical Activity: Not on file    Stress: Not on file    Social Connections: Not on file    Intimate Partner Violence: Not on file                  Prior to Admission medications     Medication   Sig   Start Date   End Date   Taking?   Authorizing Provider    acetaminophen (TYLENOL) 500 MG tablet   Take 500-1,000 mg by mouth every 6 (six) hours as needed for mild pain.           Yes   [provider]    albuterol (PROVENTIL) (2.5 MG/3ML) 0.083% nebulizer solution   Take 2.5 mg by nebulization 3 (three) times daily.   06/07/20       Yes   [provider]    amoxicillin-clavulanate (AUGMENTIN) 875-125 MG tablet   Take 1 tablet by mouth 2 (two) times daily for 3 days.   04/10/21   04/13/21   Yes   Rada Hay, MD    ANORO ELLIPTA 62.5-25 MCG/INH AEPB   Inhale 1 puff into the lungs daily.   10/11/20       Yes   [provider]    aspirin EC 81 MG EC tablet   Take 1 tablet (81 mg total) by mouth daily. Swallow whole.   08/10/20       Yes   Samuella Cota, MD    budesonide (PULMICORT) 1 MG/2ML nebulizer solution   Inhale 2 mLs into the lungs at bedtime.   02/15/21   02/15/22   Yes   [provider]    clopidogrel (PLAVIX) 75 MG tablet   Take 75 mg by mouth daily.   11/15/20       Yes   [provider]    diazepam (VALIUM) 2 MG tablet   Take 1 tablet (2 mg total) by mouth as directed for 1 dose. Take one tablet 1 hour prior to your CT scans. Bring the second tablet to your appointment with you and only take if needed.   03/10/21       Yes   Eileen Stanford, PA-C     DULoxetine (CYMBALTA) 20 MG capsule   Take 1 capsule (20 mg total) by mouth in the morning.   11/26/20       Yes   Jennye Boroughs, MD    FLUoxetine (PROZAC) 40 MG capsule   Take 40 mg by mouth 2 (two) times daily.   08/23/20       Yes   [provider]    guaiFENesin (MUCINEX) 600 MG 12 hr tablet   Take 2 tablets (1,200 mg total) by mouth 2 (two) times daily.   02/13/21       Yes   Aline August, MD    Insulin Glargine (BASAGLAR KWIKPEN) 100 UNIT/ML   Inject 100 Units into the skin at bedtime.  Yes   [provider]    insulin lispro (HUMALOG) 100 UNIT/ML injection   Inject 0-54 Units into the skin 3 (three) times daily before meals.           Yes   [provider]    isosorbide mononitrate (IMDUR) 30 MG 24 hr tablet   Take 1 tablet (30 mg total) by mouth daily.   11/03/20   11/03/21   Yes   Corey Skains, MD    levothyroxine (SYNTHROID, LEVOTHROID) 125 MCG tablet   Take 125 mcg by mouth daily before breakfast.   04/03/17       Yes   [provider]    losartan (COZAAR) 25 MG tablet   Take 1 tablet (25 mg total) by mouth in the morning.   11/26/20       Yes   Jennye Boroughs, MD    methylPREDNISolone (MEDROL DOSEPAK) 4 MG TBPK tablet   Take one tablet (4 mg) at bedtime and take one tablet in the morning.   02/13/21       Yes   Aline August, MD    nitroGLYCERIN (NITROSTAT) 0.4 MG SL tablet   Place 1 tablet (0.4 mg total) under the tongue every 5 (five) minutes as needed for chest pain.   03/06/21       Yes   [name], MD    ondansetron (ZOFRAN-ODT) 4 MG disintegrating tablet   Take 4 mg by mouth 3 (three) times daily as needed for nausea or vomiting.   02/14/21       Yes   [provider]    pantoprazole (PROTONIX) 40 MG tablet   Take 40 mg by mouth in the morning.           Yes   [provider]    torsemide (DEMADEX) 20 MG tablet   Take 20 mg by mouth daily.   12/08/20       Yes   [provider]    traZODone (DESYREL) 100 MG tablet   Take 200 mg by mouth at bedtime.           Yes   [provider]                 Current Outpatient Medications    Medication   Sig   Dispense   Refill       acetaminophen (TYLENOL) 500 MG tablet   Take 500-1,000 mg by mouth every 6 (six) hours as needed for mild pain.               albuterol (PROVENTIL) (2.5 MG/3ML) 0.083% nebulizer solution   Take 2.5 mg by nebulization 3 (three) times daily.               amoxicillin-clavulanate (AUGMENTIN) 875-125 MG tablet   Take 1 tablet by mouth 2 (two) times daily for 3 days.   6 tablet   0       ANORO ELLIPTA 62.5-25 MCG/INH AEPB   Inhale 1 puff into the lungs daily.               aspirin EC 81 MG EC tablet   Take 1 tablet (81 mg total) by mouth daily. Swallow whole.   30 tablet   11       budesonide (PULMICORT) 1 MG/2ML nebulizer solution   Inhale 2 mLs into the lungs at bedtime.  clopidogrel (PLAVIX) 75 MG tablet   Take 75 mg by mouth daily.               diazepam (VALIUM) 2 MG tablet   Take 1 tablet (2 mg total) by mouth as directed for 1 dose. Take one tablet 1 hour prior to your CT scans. Bring the second tablet to your appointment with you and only take if needed.   2 tablet   0       DULoxetine (CYMBALTA) 20 MG capsule   Take 1 capsule (20 mg total) by mouth in the morning.               FLUoxetine (PROZAC) 40 MG capsule   Take 40 mg by mouth 2 (two) times daily.               guaiFENesin (MUCINEX) 600 MG 12 hr tablet   Take 2 tablets (1,200 mg total) by mouth 2 (two) times daily.   30 tablet   0       Insulin Glargine (BASAGLAR KWIKPEN) 100 UNIT/ML   Inject 100 Units into the skin at bedtime.                insulin lispro (HUMALOG) 100 UNIT/ML injection   Inject 0-54 Units into the skin 3 (three) times daily before meals.               isosorbide mononitrate (IMDUR) 30 MG 24 hr tablet   Take 1 tablet (30 mg total) by mouth daily.   30 tablet   11       levothyroxine (SYNTHROID, LEVOTHROID) 125 MCG tablet   Take 125 mcg by mouth daily before breakfast.               losartan (COZAAR) 25 MG tablet   Take 1 tablet (25 mg total) by mouth in the morning.               methylPREDNISolone (MEDROL DOSEPAK) 4 MG TBPK tablet   Take one tablet (4 mg) at bedtime and take one tablet in the morning.   2 tablet   0       nitroGLYCERIN (NITROSTAT) 0.4 MG SL tablet   Place 1 tablet (0.4 mg total) under the tongue every 5 (five) minutes as needed for chest pain.   90 tablet   3       ondansetron (ZOFRAN-ODT) 4 MG disintegrating tablet   Take 4 mg by mouth 3 (three) times daily as needed for nausea or vomiting.               pantoprazole (PROTONIX) 40 MG tablet   Take 40 mg by mouth in the morning.               torsemide (DEMADEX) 20 MG tablet   Take 20 mg by mouth daily.               traZODone (DESYREL) 100 MG tablet   Take 200 mg by mouth at bedtime.                No current facility-administered medications for this visit.                Allergies    Allergen   Reactions       Amitriptyline   Other (See Comments)            Unknown reaction       Benadryl [Diphenhydramine]  Shortness Of Breath       Demerol [Meperidine]   Other (See Comments)            Unknown reaction       Gabapentin   Other (See Comments)            Unknown reaction       Mirtazapine   Other (See Comments)            Unknown reaction       Olanzapine   Other (See Comments)            Unknown reaction          Voltaren [Diclofenac Sodium]   Shortness  Of Breath       Zetia [Ezetimibe]   Other (See Comments)            Weakness in legs, shakiness all over       Ativan [Lorazepam]   Other (See Comments)            Causes double vision at highter than .5 mg dose       Atorvastatin   Other (See Comments)            Muscle aches and weakness       Budesonide-Formoterol Fumarate   Other (See Comments)            Shakiness, tremors       Bupropion Hcl   Other (See Comments)            "cloud over me" depression       Caffeine   Other (See Comments)            jitters       Codeine Sulfate   Other (See Comments)            Makes chest hurt like a heart attack       Lisinopril   Cough       Metformin   Nausea And Vomiting       Mometasone Furoate   Nausea And Vomiting       Morphine Sulfate   Other (See Comments)            Chest pain like a heart attack       Other   Other (See Comments)            Beta Blockers, reaction shortness of breath       Oxycodone-Acetaminophen   Nausea And Vomiting       Pioglitazone   Other (See Comments)            Cannot take because of risk of bladder cancer       Propoxyphene N-Acetaminophen   Nausea And Vomiting       Rosuvastatin   Other (See Comments)            Muscle aches and weakness       Shellfish Allergy   Diarrhea       Suvorexant   Other (See Comments)            Jerking/nervous        Ticagrelor                Other reaction(s): Other (See Comments)  "slowed heart rate" & chest pain       Tramadol   Nausea Only       Venlafaxine   Other (See Comments)  Unknown reaction       Xanax [Alprazolam]   Other (See Comments)            Mental status changes, "didn't know where she was at" per husband at bedside       Zolpidem Tartrate   Other (See Comments)              Jittery, diarrhea       Latex   Rash                Review of Systems:                 General:                      normal appetite, + decreased energy, + weight gain, no weight loss, no fever              Cardiac:                       + chest pain with exertion, no chest pain at rest, +SOB with any exertion, + resting SOB, no PND, + orthopnea, no palpitations, no arrhythmia, no atrial fibrillation, no LE edema, no dizzy spells, no syncope              Respiratory:                 +  shortness of breath, + home oxygen, no productive cough, no dry cough, no bronchitis, no wheezing, no hemoptysis, no asthma, no pain with inspiration or cough, no sleep apnea, no CPAP at night              GI:                               no difficulty swallowing, no reflux, no frequent heartburn, no hiatal hernia, no abdominal pain, no constipation, no diarrhea, no hematochezia, no hematemesis, no melena              GU:                              n dysuria,  ono frequency, no urinary tract infection, no hematuria, no kidney stones, no kidney disease              Vascular:                     no pain suggestive of claudication, no pain in feet, no leg cramps, no varicose veins, no DVT, no non-healing foot ulcer              Neuro:                         no stroke, no TIA's, no seizures, + headaches, no temporary blindness one eye,  no slurred speech, no peripheral neuropathy, no chronic pain, no instability of gait, + memory/cognitive dysfunction              Musculoskeletal:         + arthritis, no joint swelling, no myalgias, + difficulty walking, + reduced mobility               Skin:  no rash, no itching, no skin infections, no pressure sores or ulcerations              Psych:                         + anxiety, + depression, no nervousness, no unusual recent stress              Eyes:                           no blurry vision, no floaters,  no recent vision changes, no glasses or contacts              ENT:                            + hearing loss, no loose or painful teeth, full dentures.              Hematologic:               no easy bruising, no abnormal bleeding, no clotting disorder, no frequent epistaxis              Endocrine:                   + diabetes, does  check CBG's at home                               Physical Exam:                 BP (!) 94/59 (BP Location: Left Arm, Patient Position: Sitting)   Pulse 81   Resp 20   Ht _0  (1.676 m)   Wt 200 lb (90.7 kg)   SpO2 94% Comment: RA  BMI 32.28 kg/m               General:                      Chronically ill-appearing              HEENT:                       Unremarkable, NCAT, PERLA, EOMI              Neck:                           no JVD, no bruits, no adenopathy               Chest:                          clear to auscultation, symmetrical breath sounds, no wheezes, no rhonchi               CV:                              RRR, 3/6 systolic murmur RSB, no diastolic murmur              Abdomen:                    soft, non-tender, no masses  Extremities:                 warm, well-perfused, pulses palpable at ankle, + lower extremity edema in ankles              Rectal/GU                   Deferred              Neuro:                         Grossly non-focal and symmetrical throughout              Skin:                            Clean and dry, no rashes, no breakdown     Diagnostic Tests:     ECHOCARDIOGRAM REPORT         Patient Name:   [name] Date of Exam: 03/01/2021  Medical Rec #:  [id number]     Height:       66.0 in  Accession #:    [id number]    Weight:       200.0 lb  Date of Birth:  03-06-1946      BSA:          2.000 m  Patient Age:    58 years      BP:           129/72 mmHg  Patient Gender: F             HR:           89 bpm.  Exam Location:  [place]   Procedure: 2D Echo, Cardiac  Doppler and Color Doppler   Indications:    Z95.3 AVR     History:        Patient has prior history of Echocardiogram examinations,  most                  recent 02/13/2021. CHF, CAD and Previous Myocardial  Infarction,                  COPD, Aortic Valve Disease, Arrythmias:AV block,                  Signs/Symptoms:Shortness of Breath; Risk  Factors:Hypertension,                  Sleep Apnea, Diabetes and Former Smoker. Anemia.                  Aortic Valve: 21 mm Herendeen Magna-Ease valve is present in  the                  aortic position. Procedure Date: 10/12/13.     Sonographer:    Basilia Jumbo [facility], RDCS  Referring Phys: [id number] [place]      Sonographer Comments: Patient had significant shortness of breath during  the exam.  IMPRESSIONS     1. S/P AVR not well visualized; leaflets appear restricted; severe AS  with mean gradient 41 mmHg; severe MAC with mild MS.   2. Left ventricular ejection fraction, by estimation, is 60 to 65%. The  left ventricle has normal function. The left ventricle has no regional  wall motion abnormalities. There is severe left ventricular hypertrophy.  Left ventricular diastolic parameters  are indeterminate. Elevated left atrial pressure.   3. Right ventricular systolic function is normal. The right ventricular  size is normal.   4. Left atrial size was mildly dilated.   5. The mitral valve is normal in structure. Trivial mitral valve  regurgitation. Mild mitral stenosis. Severe mitral annular calcification.   6. The aortic valve has been repaired/replaced. Aortic valve  regurgitation is mild. Severe aortic valve stenosis. There is a 21 mm  Truby Magna-Ease valve present in the aortic position. Procedure Date:  10/12/13.   7. Aortic dilatation noted. There is mild dilatation of the aortic root,  measuring 40 mm. There is mild dilatation of the ascending aorta,  measuring 41 mm.   Comparison(s): TTE 02/13/21 EF 50-55%. AV 87mHg  mean PG, 365mG peak PG.  TEE 11/03/20 EF 55-60%. AV 3248m mean PG, 19m57mpeak PG.   FINDINGS   Left Ventricle: Left ventricular ejection fraction, by estimation, is 60  to 65%. The left ventricle has normal function. The left ventricle has no  regional wall motion abnormalities. The left ventricular internal cavity  size was normal in size. There is   severe left ventricular hypertrophy. Left ventricular diastolic  parameters are indeterminate. Elevated left atrial pressure.   Right Ventricle: The right ventricular size is normal. Right ventricular  systolic function is normal.   Left Atrium: Left atrial size was mildly dilated.   Right Atrium: Right atrial size was normal in size.   Pericardium: There is no evidence of pericardial effusion.   Mitral Valve: The mitral valve is normal in structure. Severe mitral  annular calcification. Trivial mitral valve regurgitation. Mild mitral  valve stenosis. MV peak gradient, 8.9 mmHg. The mean mitral valve gradient  is 4.0 mmHg.   Tricuspid Valve: The tricuspid valve is normal in structure. Tricuspid  valve regurgitation is trivial. No evidence of tricuspid stenosis.   Aortic Valve: The aortic valve has been repaired/replaced. Aortic valve  regurgitation is mild. Severe aortic stenosis is present. Aortic valve  mean gradient measures 41.3 mmHg. Aortic valve peak gradient measures 70.4  mmHg. There is a 21 mm Wyffels  Magna-Ease valve present in the aortic position. Procedure Date: 10/12/13.   Pulmonic Valve: The pulmonic valve was normal in structure. Pulmonic valve  regurgitation is trivial. No evidence of pulmonic stenosis.   Aorta: Aortic dilatation noted. There is mild dilatation of the aortic  root, measuring 40 mm. There is mild dilatation of the ascending aorta,  measuring 41 mm.   Venous: The inferior vena cava was not well visualized.   IAS/Shunts: The interatrial septum was not well visualized.   Additional Comments: S/P  AVR not well visualized; leaflets appear  restricted; severe AS with mean gradient 41 mmHg; severe MAC with mild MS.      LEFT VENTRICLE  PLAX 2D  LVIDd:         3.80 cm Diastology  LVIDs:         2.70 cm LV e' medial:    5.25 cm/s  LV PW:         1.70 cm LV E/e' medial:  28.0  LV IVS:        1.70 cm LV e' lateral:   6.45 cm/s                         LV E/e' lateral: 22.8      RIGHT VENTRICLE  RV Basal diam:  3.10 cm  RV S prime:  6.60 cm/s   LEFT ATRIUM             Index       RIGHT ATRIUM           Index  LA diam:        4.70 cm 2.35 cm/m  RA Pressure: 3.00 mmHg  LA Vol (A2C):   67.3 ml 33.65 ml/m RA Area:     10.60 cm  LA Vol (A4C):   62.1 ml 31.05 ml/m RA Volume:   21.50 ml  10.75 ml/m  LA Biplane Vol: 65.2 ml 32.60 ml/m   AORTIC VALVE  AV Vmax:           419.67 cm/s  AV Vmean:          292.750 cm/s  AV VTI:            0.782 m  AV Peak Grad:      70.4 mmHg  AV Mean Grad:      41.3 mmHg  LVOT Vmax:         135.00 cm/s  LVOT Vmean:        91.200 cm/s  LVOT VTI:          0.264 m  LVOT/AV VTI ratio: 0.34     AORTA  Ao Root diam: 4.00 cm  Ao Asc diam:  4.10 cm   MITRAL VALVE                TRICUSPID VALVE                              Estimated RAP:  3.00 mmHg  MV Peak grad:  8.9 mmHg  MV Mean grad:  4.0 mmHg     SHUNTS  MV Vmax:       1.49 m/s     Systemic VTI: 0.26 m  MV Vmean:      97.9 cm/s  MV Decel Time: 250 msec  MV E velocity: 147.00 cm/s  MV A velocity: 175.00 cm/s  MV E/A ratio:  0.84   Kirk Ruths MD  Electronically signed by Kirk Ruths MD  Signature Date/Time: 03/01/2021/1:38:40 PM         Final          Physicians        Panel Physicians    Referring Physician    Case Authorizing Physician     Andrez Grime, MD (Primary)       Andrez Grime, MD    Lujean Amel D, MD (Assisting)               Procedures       RIGHT/LEFT HEART CATH AND CORONARY/GRAFT ANGIOGRAPHY        Conclusion          Mid LAD lesion is 100% stenosed.    Ost RCA lesion is 99% stenosed.    Ost RCA to Prox RCA lesion is 20% stenosed.    Prox RCA lesion is 80% stenosed.    Mid RCA lesion is 100% stenosed.    Prox Cx to Mid Cx lesion is 50% stenosed.    Previously placed Origin to Prox Graft stent (unknown type) is  widely patent.    Previously placed Dist Graft to Insertion stent (unknown type) is  widely patent.    Previously placed Prox Cx stent (unknown type) is  widely patent.    SVG and is large.  LIMA and is normal in caliber and large.     Patient agitation and frequent movement made the procedure technically difficult.      Conclusion:  1.Severe native three-vessel coronary artery disease including CTO of the mid LAD, CTO of the mid RCA, and patent stent in the mid left circumflex.   2.Patent SVG to RCA with 2 patent stents in the proximal and distal segments.   3.Patent LIMA to LAD   4.RA 6, RV 38/5, PA 37/16 (25), PCWP 15   5.Fick CO equals 3.91, Fick CI 1.93      Recommendation  1.Unable to cross the aortic valve during this procedure.  She needs additional evaluation to determine the severity of her aortic valve stenosis.  Likely we will perform a TEE next week.   2.She has been appropriately diuresed with a RA pressure of 6 and pulmonary capillary wedge pressure 15.  I have ordered her home torsemide of 20 mg daily to be started tomorrow.   3.Continue aspirin and Plavix as previously prescribed for stent placed in June 2022      Indications       NSTEMI (non-ST elevated myocardial infarction) ([place]) [I21.4 (ICD-10-CM)]       Procedural Details       Technical Details   Cardiac Catheterization Procedure Note   Procedural details: The right groin was prepped, draped, and anesthetized with 1% lidocaine. Under ultrasound guidance and using modified Seldinger technique, a 5 French sheath was introduced into the  right femoral artery. Standard Judkins catheters were used for coronary angiography (JR4 and JL5). SVG to RCA engaged using JR4. LIMA engaged using IMA. Catheter exchanges were performed over a guidewire.   Right heart catheterization was performed by placing a 76F sheath in right femoral vein using the modified Seldinger technique under ultrasound guidance.  A balloontipped catheter was then advanced through the right heart chambers and pressures were obtained.  A pulmonary artery saturation was obtained.  At the completion of the procedure the balloontipped catheter and the sheath were removed and manual pressure was held to achieve hemostasis.  There were no immediate procedural complications. The patient was transferred to the post catheterization recovery area for further monitoring.     Estimated blood loss <50 mL.   During this procedure medications were administered to achieve and maintain moderate conscious sedation while the patient's heart rate, blood pressure, and oxygen saturation were continuously monitored and I was present face-to-face 100% of this time.       Medications  (Filter: Administrations occurring from 1441 to 1609 on 02/10/21)     important  Continuous medications are totaled by the amount administered until 02/10/21 1609.       midazolam (VERSED) injection (mg)  Total dose:  1 mg     untitled imageuntitled imageDate/Time    Rate/Dose/Volume    Action          02/10/21 1454   0.5 mg   Given        1456   0.5 mg   Given           fentaNYL (SUBLIMAZE) injection (mcg)  Total dose:  50 mcg     untitled imageuntitled imageDate/Time    Rate/Dose/Volume    Action          02/10/21 1454   25 mcg   Given        1456   25 mcg   Given  Heparin (Porcine) in NaCl 1000-0.9 UT/500ML-% SOLN (mL)  Total volume:  1,000 mL     untitled imageuntitled imageDate/Time    Rate/Dose/Volume     Action          02/10/21 1455   1,000 mL   Given           lidocaine (PF) (XYLOCAINE) 1 % injection (mL)  Total volume:  10 mL     untitled imageuntitled imageDate/Time    Rate/Dose/Volume    Action          02/10/21 1455   10 mL   Given           iohexol (OMNIPAQUE) 350 MG/ML injection (mL)  Total volume:  100 mL     untitled imageuntitled imageDate/Time    Rate/Dose/Volume    Action          02/10/21 1557   100 mL   Given           furosemide (LASIX) tablet 40 mg (mg)  Total dose:  Cannot be calculated* Dosing weight:  97.1  untitled imageuntitled image*Administration dose not documented     Date/Time    Rate/Dose/Volume    Action          02/10/21 1552   *Not included in total   MAR Unhold           Sedation Time     Sedation Time Physician-1: 49 minutes 52 seconds  Contrast       Medication Name   Total Dose    iohexol (OMNIPAQUE) 350 MG/ML injection   100 mL       Radiation/Fluoro     Fluoro time: 21.3 (min)  DAP: 57.2 (Gycm2)  Cumulative Air Kerma: 466 (mGy)  Complications          Complications documented before study signed (02/10/2021  4:17 PM)         No complications were associated with this study.    Documented by Andrez Grime, MD - 02/10/2021  4:17 PM          Coronary Findings     Diagnostic  Dominance: Right    Left Main    Vessel is large.    Left Anterior Descending    Mid LAD lesion is 100% stenosed. The lesion is chronically occluded.    First Diagonal Branch    Vessel is moderate in size. The vessel exhibits minimal luminal irregularities.    Lateral First Diagonal Branch    Vessel is small in size.    First Septal Branch    Vessel is moderate in size.    Second Septal Branch    Vessel is small in size.    Third Diagonal Branch    Vessel is small in size.    Third  Septal Branch    Vessel is small in size.    Left Circumflex    The circumflex is patent. The stented segment previously treated with cutting balloon angioplasty demonstrates mild-moderate ISR estimated at 50%    Previously placed Prox Cx stent (unknown type) is widely patent.    Prox Cx to Mid Cx lesion is 50% stenosed.    Third Obtuse Marginal Branch    Vessel is moderate in size. Vessel is angiographically normal.    Right Coronary Artery    Vessel is large. The RCA is not selectively injected. The SVG-distal RCA is widely patent and the distal RCA filling in retrograde fashion demonstrated 95% stenosis proximal to  the graft insertion site. Beyond the graft insertion site the RCA and it's branches are patent.    Ost RCA lesion is 99% stenosed. The lesion is focal.    Ost RCA to Prox RCA lesion is 20% stenosed. The lesion was previously treated using a stent (unknown type) over 2 years ago.    Prox RCA lesion is 80% stenosed.    Mid RCA lesion is 100% stenosed. The lesion is chronically occluded.    Right Posterior Descending Artery    Vessel is large in size. Vessel is angiographically normal.    Inferior Septal    Vessel is small in size.    Right Posterior Atrioventricular Artery    Vessel is angiographically normal.    First Right Posterolateral Branch    Vessel is small in size.    Second Right Posterolateral Branch    Vessel is small in size.    Third Right Posterolateral Branch    Vessel is small in size.    Saphenous Graft To Dist RCA    SVG and is large.    Previously placed Origin to Prox Graft stent (unknown type) is widely patent.    Previously placed Dist Graft to Insertion stent (unknown type) is widely patent.    LIMA LIMA Graft To Dist LAD    LIMA and is normal in caliber and large. Grade fills the major second diagonal branch. The graft is widely patent, fills the large diagonal in retrograde fashion, and  the distal LAD beyond the graft insertion is patent to the apex.    Intervention     No interventions have been documented.  Coronary Diagrams     Diagnostic  Dominance: Right  Diagnostic ImageIntervention     Implants             Vascular Products    Device Closure Mynxgrip 34f-- [id number] Implanted    untitled imageuntitled imageInventory item:   DEVICE CLOSURE MYNXGRIP 62F   Model/Cat number:   [id number]   Manufacturer:   [employer]   Lot number:   [id number]   Device identifier:   [id number]  Device identifier type:   GS1    Area Of Implantation:   Groin            GUDID Information       Request status   Successful            Brand name:   [facility]  Version/Model:   [id number]   Company name:   [employer]   MRI safety info as of 02/10/21:   MR Safe    Contains dry or latex rubber:   No            GMDN P.T. name:   Wound hydrogel dressing, non-antimicrobial               As of 02/10/2021       Status:   Implanted                   Syngo Images      Show images for CARDIAC CATHETERIZATION  Images on Long Term Storage      Show images for [place], [place] Procedure Log       Procedure Log       Hemo Data (last 6 days) before discharge        AO Systolic Cath Pressure    AO Diastolic  Cath Pressure    AO Mean Cath Pressure    PA Systolic Cath Pressure    PA Diastolic Cath Pressure    PA Mean Cath Pressure    RA Wedge A Wave    RA Wedge V Wave    RV Systolic Cath Pressure    RV Diastolic Cath Pressure    RV End Diastolic    RV Systolic    RV End Diastolic    RV dP/dt    PCW A Wave    PCW V Wave    PCW Mean    AO O2 Sat    PA O2 Sat    AO O2 Sat    Fick C.O.    Fick C.I.     --   --   --   --   --   --   6 mmHg   6 mmHg   --   --   --   38  mmHg   5 mmHg   480 mmHg/sec   18 mmHg   15 mmHg   15 mmHg   --   --   --   3.91 L/min   1.93 L/min/m2    --   --   --   --   --   --   --   --   38 mmHg   1 mmHg   5 mmHg   --   --   --   --   --   --   --   --   --   --   --    --   --   --   37 mmHg   16 mmHg   25 mmHg   --   --   --   --   --   --   --   --   --   --   --   --   --   --   --   --    --   --   --   --   --   --   --   --   --   --   --   --   --   --   --   --   --   97.9 %   --   SA   --   --    --   --   --   --   --   --   --   --   --   --   --   --   --   --   --   --   --   --   MV   --   --   --    154   77 mmHg   103 mmHg   --   --   --   --   --   --   --   --                                                        ADDENDUM REPORT: 03/17/2021 16:18     CLINICAL DATA:  16F with 21 mm Bullen Magna-Ease bioprosthetic  aortic valve (placement 10/13/13), now with severe prosthetic valve  stenosis  being evaluated for TAVR valve in valve procedure.     EXAM:  Cardiac TAVR CT     TECHNIQUE:  The patient was scanned on a [employer]. A 120 kV  retrospective scan was triggered in the descending thoracic aorta at  111 HU's. Gantry rotation speed was 250 msecs and collimation was .6  mm. No beta blockade or nitro were given. The 3D data set was  reconstructed in 5% intervals of the R-R cycle. Systolic and  diastolic phases were analyzed on a dedicated work station using  MPR, MIP and VRT modes. The patient received 80 cc of contrast.     FINDINGS:  Aortic Prosthesis: A 21 mm Carpentier-Barno Magna Ease prosthetic  valve is present in the aortic position, procedure date 10/13/2013.  The leaflets are thickened with restricted leaflet motion. There  is  an area of low attenuation (<90 HU) at [facility] concerning for leaflet  thrombus     Valve in valve: A valve in valve analysis was performed using both a  459m and 23359mEdwards Sapien 3 virtual transcatheter heart valve  (THV).     Virtual THV to coronary ostia (VTC):     VTC LCA: The LCA ostium measures 359m49mrom the virtual THV using a  59m20mpien 3 valve, and 59mm 34m 20mm 45me     VTC RCA: The RCA ostium measures 4mm fr659mthe virtual THV using a  59mm Sa12m 3 valve, and 5mm for 8359mm valv8m  Optimal Fluoroscopic Angle for Delivery: LAO 4 CRA 3     Aortic sinus width: Left cusp - 31mm; Righ559msp - 28mm; Nonco11mry  cusp - 28mm     Sin79mular junction width: 30mm x 259mm  83mscen24m aorta: 38mm     Right 15mum: Mild enlargement     Right ventricle: Mild dilatation     Pulmonary arteries: Dilated main PA measuring 3359mm     Pulmona3359meins: Normal configuration     Left atrium: Moderate enlargement     Left ventricle: Severe hypertrophy, mild dilatation     Pericardium: Normal thickness     Coronary arteries: Patent LIMA-LAD and SVG-RCA     IMPRESSION:  1. 21 mm Carpentier-Mauch Magna Ease prosthetic valve is present  in the aortic position, procedure date 10/13/2013. The leaflets are  thickened with restricted leaflet motion. There is an area of low  attenuation (<90 HU) at [facility] concerning [facility] Hospit[facility]af[facility] thrombus     2. A valve in valve analysis was performed using both a 59mm and  20mm Ed88ms Sapie559mvirtual transcatheter heart valve (THV). There  is risk of coronary obstruction for the RCA, with RCA ostium  measuring 4mm from the virtua105mHV using a 59mm Sapien 3 valve 6359m  for 20mm valve).3359mfficien38mrtual THV to coronary ostia distance  for left main, measuring 359mm from the virtual TH30msing a 59mm  Sapien 3 valve (59m91mr 20mm valve).     100mOptima44muoroscopic Angle for Delivery: LAO 4 CRA  3     4. S/p CABG with patent LIMA-LAD and SVG-RCA        Electronically Signed    By: Christopher  Schumann M.D. Oswaldo Milian18       Addended by Schumann, Christopher L, MDDonato Heinz       Study Result       Narrative & Impression    EXAM:  OVER-READ  INTERPRETATION  CT CHEST     The following report is an over-read performed by radiologist Dr.  Vinnie Langton of [facility], [place] on 03/16/2021. This  over-read does not include interpretation of cardiac or coronary  anatomy or pathology. The coronary calcium score/coronary CTA  interpretation by the cardiologist is attached.     COMPARISON:  Chest CTA 02/08/2021.     FINDINGS:  Extracardiac findings will be described separately under dictation  for contemporaneously obtained CTA chest, abdomen and pelvis.     IMPRESSION:  Please see separate dictation for contemporaneously obtained CTA  chest, abdomen and pelvis dated 03/16/2021 for full description of  relevant extracardiac findings.     Electronically Signed:  By: Vinnie Langton M.D.  On: 03/16/2021 12:11         Narrative & Impression    CLINICAL DATA:  75 year old female with history of severe aortic  stenosis. Preprocedural study prior to potential transcatheter  aortic valve replacement (TAVR) procedure.     EXAM:  CT ANGIOGRAPHY CHEST, ABDOMEN AND PELVIS     TECHNIQUE:  Multidetector CT imaging through the chest, abdomen and pelvis was  performed using the standard protocol during bolus administration of  intravenous contrast. Multiplanar reconstructed images and MIPs were  obtained and reviewed to evaluate the vascular anatomy.     CONTRAST:  120m OMNIPAQUE IOHEXOL 350 MG/ML SOLN     COMPARISON:  Chest CTA 02/08/2021. CT the abdomen and pelvis  10/09/2016.     FINDINGS:  CTA CHEST FINDINGS     Cardiovascular: Heart size is mildly enlarged. There is  no  significant pericardial fluid, thickening or pericardial  calcification. There is aortic atherosclerosis, as well as  atherosclerosis of the great vessels of the mediastinum and the  coronary arteries, including calcified atherosclerotic plaque in the  left main, left anterior descending, left circumflex and right  coronary arteries. Status post median sternotomy for CABG including  LIMA to the LAD as well as aortic valve replacement with what  appears to be a stented bioprosthesis which demonstrates some  calcifications of the cusps. Dilatation of the pulmonic trunk (3.9  cm in diameter), concerning for pulmonary arterial hypertension.     Mediastinum/Lymph Nodes: No pathologically enlarged mediastinal or  hilar lymph nodes. Esophagus is unremarkable in appearance. No  axillary lymphadenopathy.     Lungs/Pleura: Widespread but patchy areas of ground-glass  attenuation and interlobular septal thickening are noted in the  lungs bilaterally, with intervening areas of lucency. Overall, the  appearance suggests a background of interstitial pulmonary edema,  likely with some air trapping from small airways disease. No  confluent consolidative airspace disease. No pleural effusions. No  definite suspicious appearing pulmonary nodules or masses are noted.     Musculoskeletal/Soft Tissues: Status post median sternotomy with  residual sternotomy wires in the manubrium. There are no aggressive  appearing lytic or blastic lesions noted in the visualized portions  of the skeleton.     CTA ABDOMEN AND PELVIS FINDINGS     Hepatobiliary: Diffuse low attenuation throughout the hepatic  parenchyma, indicative of a background of hepatic steatosis. Liver  has a nodular contour, indicative of underlying cirrhosis. No  discrete cystic or solid hepatic lesions. No intra or extrahepatic  biliary ductal dilatation. Status post cholecystectomy.     Pancreas: No  pancreatic mass. No pancreatic ductal dilatation. No  pancreatic or peripancreatic fluid collections or inflammatory  changes.     Spleen: Unremarkable.     Adrenals/Urinary Tract:  1.3 x 1.0 cm right adrenal nodule which  measures 8 HU on prior noncontrast CT dated 02/01/2021, compatible  with a small adrenal adenoma. Left adrenal gland is normal in  appearance. Mild multifocal cortical thinning in the kidneys  bilaterally. No suspicious renal lesions. No hydroureteronephrosis.  Urinary bladder is normal in appearance.     Stomach/Bowel: The appearance of the stomach is normal. There is no  pathologic dilatation of small bowel or colon. Numerous colonic  diverticulae are noted, particularly in the descending colon and  sigmoid colon, without surrounding inflammatory changes to suggest  an acute diverticulitis at this time. The appendix is not  confidently identified and may be surgically absent. Regardless,  there are no inflammatory changes noted adjacent to the cecum to  suggest the presence of an acute appendicitis at this time.     Vascular/Lymphatic: Aortic atherosclerosis with vascular findings  and measurements pertinent to potential TAVR procedure, as detailed  below. Multifocal fusiform ectasia of the infrarenal abdominal aorta  which measures up to 2.8 x 2.7 cm. Notably, in the region of the  infrarenal abdominal aorta (axial images 112-117 of series 6) there  appears to be multiple short segment dissection flaps which  demonstrate partial calcification. No surrounding inflammatory  changes or soft tissue thickening. No lymphadenopathy identified in  the abdomen or pelvis.     Reproductive: Status post hysterectomy. Ovaries are not confidently  identified may be surgically absent or atrophic.     Other: No significant volume of ascites.  No pneumoperitoneum.     Musculoskeletal: There are no aggressive appearing lytic or  blastic  lesions noted in the visualized portions of the skeleton.     VASCULAR MEASUREMENTS PERTINENT TO TAVR:     AORTA:     Minimal Aortic Diameter-14 x 13 mm     Severity of Aortic Calcification-moderate     RIGHT PELVIS:     Right Common Iliac Artery -     Minimal Diameter-11.0 x 8.2 mm     Tortuosity-moderate     Calcification-moderate     Right External Iliac Artery -     Minimal Diameter-8.3 x 7.8 mm     Tortuosity-moderate     Calcification-none     Right Common Femoral Artery -     Minimal Diameter-8.6 x 8.5 mm     Tortuosity-mild     Calcification-mild     LEFT PELVIS:     Left Common Iliac Artery -     Minimal Diameter-10.4 x 9.9 mm     Tortuosity-mild     Calcification-moderate     Left External Iliac Artery -     Minimal Diameter-8.5 x 8.8 mm     Tortuosity-mild     Calcification-minimal     Left Common Femoral Artery -     Minimal Diameter-9.1 x 6.9 mm     Tortuosity-mild     Calcification-mild-to-moderate     Review of the MIP images confirms the above findings.     IMPRESSION:  1. Vascular findings and measurements pertinent to potential TAVR  procedure, as detailed above. Please take note of the fusiform  ectasia of the infrarenal abdominal aorta which has multiple short  segment chronic dissection flaps in this region, which could have  implications at the time of catheter manipulation during TAVR.  2. Status post aortic valve replacement with what appears to be a  stented bioprosthesis which demonstrates calcifications of the  prosthetic cusps, compatible with reported clinical history of  severe aortic stenosis.  3. The appearance of the lungs suggests a combination of  interstitial pulmonary edema and probable air trapping from small  airways disease.  4. Mild cardiomegaly.  5. Dilatation of the pulmonic trunk (3.9 cm in diameter),  concerning  for pulmonary arterial hypertension.  6. Cirrhotic liver with evidence of hepatic steatosis.  7. Small right adrenal adenoma.  8. Colonic diverticulosis without evidence of acute diverticulitis  at this time.  9. Additional incidental findings, as above.        Electronically Signed    By: Vinnie Langton M.D.    On: 03/16/2021 14:12          ADDENDUM: 40M walk test added     Pre Surgical Assessment: 5 M Walk Test     40M=16.15f     5 Meter Walk Test- trial 1: 18.2 seconds  5 Meter Walk Test- trial 2: 16.3 seconds  5 Meter Walk Test- trial 3: 17.1 seconds  5 Meter Walk Test Average: 17.2 seconds     STS Risk Score:  Risk of Mortality:  9.704%  Renal Failure:  7.868%  Permanent Stroke:  3.749%  Prolonged Ventilation:  23.484%  DSW Infection:  0.210%  Reoperation:  5.389%  Morbidity or Mortality:  28.790%  Short Length of Stay:  14.241%  Long Length of Stay:  11.678%  Impression:     This 75year old woman has stage D, severe, symptomatic aortic stenosis with New [place] Heart Association class III-IV symptoms of exertional fatigue and shortness of breath with some symptoms at rest.  I suspect that her shortness of breath is multifactorial due to obesity, severe COPD, diastolic heart failure and severe prosthetic aortic stenosis.  I have personally reviewed her 2D echocardiogram, cardiac catheterization, and CTA studies.  Her echocardiogram shows a mean gradient of 41 mmHg across the prosthetic aortic valve with restricted leaflet mobility.  Left ventricular systolic function is normal with severe LVH.  Cardiac catheterization showed severe native three-vessel coronary disease with chronic total occlusion of the mid LAD and mid RCA and a patent stent in the mid left circumflex.  The saphenous vein graft to the RCA is patent with 2 stents.  There is a patent LIMA to the LAD.  PA pressure is 37/16 with a wedge pressure  of 15 and a cardiac index of 1.93.  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  I do not think she is a candidate for redo open surgical aortic valve replacement.  She has a 21 mm Magna-Ease which is likely suitable for a 23 mm Robey SAPIEN 3 valve.  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  The femoral and iliac vessels appear of adequate size to allow transfemoral insertion.     The patient and her husband were counseled at length regarding treatment alternatives for management of severe symptomatic aortic stenosis. The risks and benefits of surgical intervention has been discussed in detail. Long-term prognosis with medical therapy was discussed. Alternative approaches such as conventional surgical aortic valve replacement, transcatheter aortic valve replacement, and palliative medical therapy were compared and contrasted at length. This discussion was placed in the context of the patient's own specific clinical presentation and past medical history. All of their questions have been addressed.      Following the decision to proceed  with transcatheter aortic valve replacement, a discussion was held regarding what types of management strategies would be attempted intraoperatively in the event of life-threatening complications, including whether or not the patient would be considered a candidate for the use of cardiopulmonary bypass and/or conversion to open sternotomy for attempted surgical intervention.  I do not think she is a candidate for emergent sternotomy to manage any intraoperative complications.  The patient is aware of the fact that transient use of cardiopulmonary bypass may be necessary. The patient has been advised of a variety of complications that might develop  including but not limited to risks of death, stroke, paravalvular leak, aortic dissection or other major vascular complications, aortic annulus rupture, device embolization, cardiac rupture or perforation, mitral regurgitation, acute myocardial infarction, arrhythmia, heart block or bradycardia requiring permanent pacemaker placement, congestive heart failure, respiratory failure, renal failure, pneumonia, infection, other late complications related to structural valve deterioration or migration, or other complications that might ultimately cause a temporary or permanent loss of functional independence or other long term morbidity. The patient provides full informed consent for the procedure as described and all questions were answered.          Plan:     Valve in valve TAVR using a SAPIEN 3 valve.         [name], MD

## 2021-04-18 ENCOUNTER — Encounter (HOSPITAL_COMMUNITY): Payer: Self-pay | Admitting: Cardiovascular Disease

## 2021-04-18 ENCOUNTER — Other Ambulatory Visit: Payer: Self-pay

## 2021-04-18 ENCOUNTER — Encounter (HOSPITAL_COMMUNITY)
Admission: RE | Disposition: A | Discharge status: Home / Self Care | Payer: Self-pay | Source: Ambulatory Visit | Attending: Cardiovascular Disease

## 2021-04-18 ENCOUNTER — Inpatient Hospital Stay (HOSPITAL_COMMUNITY): Payer: Medicare HMO | Admitting: Certified Registered Nurse Anesthetist

## 2021-04-18 ENCOUNTER — Inpatient Hospital Stay (HOSPITAL_COMMUNITY)
Admission: RE | Admit: 2021-04-18 | Discharge: 2021-04-20 | DRG: 266 | Disposition: A | Discharge status: Home / Self Care | Payer: Medicare HMO | Source: Ambulatory Visit | Attending: Cardiovascular Disease | Admitting: Cardiovascular Disease

## 2021-04-18 ENCOUNTER — Other Ambulatory Visit: Payer: Self-pay | Admitting: Cardiology

## 2021-04-18 ENCOUNTER — Inpatient Hospital Stay (HOSPITAL_COMMUNITY): Payer: Medicare HMO | Admitting: Physician Assistant

## 2021-04-18 ENCOUNTER — Inpatient Hospital Stay (HOSPITAL_COMMUNITY): Payer: Medicare HMO

## 2021-04-18 DIAGNOSIS — E669 Obesity, unspecified: Secondary | ICD-10-CM | POA: Diagnosis present

## 2021-04-18 DIAGNOSIS — I214 Non-ST elevation (NSTEMI) myocardial infarction: Secondary | ICD-10-CM | POA: Diagnosis present

## 2021-04-18 DIAGNOSIS — M1990 Unspecified osteoarthritis, unspecified site: Secondary | ICD-10-CM | POA: Diagnosis present

## 2021-04-18 DIAGNOSIS — E890 Postprocedural hypothyroidism: Secondary | ICD-10-CM | POA: Diagnosis present

## 2021-04-18 DIAGNOSIS — G4733 Obstructive sleep apnea (adult) (pediatric): Secondary | ICD-10-CM | POA: Diagnosis present

## 2021-04-18 DIAGNOSIS — Z560 Unemployment, unspecified: Secondary | ICD-10-CM

## 2021-04-18 DIAGNOSIS — I350 Nonrheumatic aortic (valve) stenosis: Secondary | ICD-10-CM

## 2021-04-18 DIAGNOSIS — K08109 Complete loss of teeth, unspecified cause, unspecified class: Secondary | ICD-10-CM | POA: Diagnosis present

## 2021-04-18 DIAGNOSIS — N1831 Chronic kidney disease, stage 3a: Secondary | ICD-10-CM | POA: Diagnosis present

## 2021-04-18 DIAGNOSIS — I441 Atrioventricular block, second degree: Secondary | ICD-10-CM | POA: Diagnosis present

## 2021-04-18 DIAGNOSIS — H9190 Unspecified hearing loss, unspecified ear: Secondary | ICD-10-CM | POA: Diagnosis present

## 2021-04-18 DIAGNOSIS — D509 Iron deficiency anemia, unspecified: Secondary | ICD-10-CM | POA: Diagnosis not present

## 2021-04-18 DIAGNOSIS — D649 Anemia, unspecified: Secondary | ICD-10-CM | POA: Diagnosis present

## 2021-04-18 DIAGNOSIS — D3501 Benign neoplasm of right adrenal gland: Secondary | ICD-10-CM | POA: Diagnosis not present

## 2021-04-18 DIAGNOSIS — Z006 Encounter for examination for normal comparison and control in clinical research program: Secondary | ICD-10-CM | POA: Diagnosis not present

## 2021-04-18 DIAGNOSIS — Z951 Presence of aortocoronary bypass graft: Secondary | ICD-10-CM

## 2021-04-18 DIAGNOSIS — Z8249 Family history of ischemic heart disease and other diseases of the circulatory system: Secondary | ICD-10-CM

## 2021-04-18 DIAGNOSIS — J449 Chronic obstructive pulmonary disease, unspecified: Secondary | ICD-10-CM | POA: Diagnosis present

## 2021-04-18 DIAGNOSIS — Z87891 Personal history of nicotine dependence: Secondary | ICD-10-CM

## 2021-04-18 DIAGNOSIS — E785 Hyperlipidemia, unspecified: Secondary | ICD-10-CM | POA: Diagnosis present

## 2021-04-18 DIAGNOSIS — E1151 Type 2 diabetes mellitus with diabetic peripheral angiopathy without gangrene: Secondary | ICD-10-CM | POA: Diagnosis not present

## 2021-04-18 DIAGNOSIS — I2582 Chronic total occlusion of coronary artery: Secondary | ICD-10-CM | POA: Diagnosis present

## 2021-04-18 DIAGNOSIS — F0394 Unspecified dementia, unspecified severity, with anxiety: Secondary | ICD-10-CM | POA: Diagnosis present

## 2021-04-18 DIAGNOSIS — Z952 Presence of prosthetic heart valve: Secondary | ICD-10-CM

## 2021-04-18 DIAGNOSIS — I130 Hypertensive heart and chronic kidney disease with heart failure and stage 1 through stage 4 chronic kidney disease, or unspecified chronic kidney disease: Secondary | ICD-10-CM | POA: Diagnosis present

## 2021-04-18 DIAGNOSIS — Z7982 Long term (current) use of aspirin: Secondary | ICD-10-CM

## 2021-04-18 DIAGNOSIS — I252 Old myocardial infarction: Secondary | ICD-10-CM

## 2021-04-18 DIAGNOSIS — Y829 Unspecified medical devices associated with adverse incidents: Secondary | ICD-10-CM | POA: Diagnosis present

## 2021-04-18 DIAGNOSIS — E1122 Type 2 diabetes mellitus with diabetic chronic kidney disease: Secondary | ICD-10-CM | POA: Diagnosis present

## 2021-04-18 DIAGNOSIS — Z91040 Latex allergy status: Secondary | ICD-10-CM

## 2021-04-18 DIAGNOSIS — Z79890 Hormone replacement therapy (postmenopausal): Secondary | ICD-10-CM

## 2021-04-18 DIAGNOSIS — K219 Gastro-esophageal reflux disease without esophagitis: Secondary | ICD-10-CM | POA: Diagnosis present

## 2021-04-18 DIAGNOSIS — F0393 Unspecified dementia, unspecified severity, with mood disturbance: Secondary | ICD-10-CM | POA: Diagnosis present

## 2021-04-18 DIAGNOSIS — Z6835 Body mass index (BMI) 35.0-35.9, adult: Secondary | ICD-10-CM

## 2021-04-18 DIAGNOSIS — Z7902 Long term (current) use of antithrombotics/antiplatelets: Secondary | ICD-10-CM

## 2021-04-18 DIAGNOSIS — Z955 Presence of coronary angioplasty implant and graft: Secondary | ICD-10-CM

## 2021-04-18 DIAGNOSIS — E7800 Pure hypercholesterolemia, unspecified: Secondary | ICD-10-CM | POA: Diagnosis present

## 2021-04-18 DIAGNOSIS — Z90710 Acquired absence of both cervix and uterus: Secondary | ICD-10-CM

## 2021-04-18 DIAGNOSIS — R0600 Dyspnea, unspecified: Secondary | ICD-10-CM

## 2021-04-18 DIAGNOSIS — T82857A Stenosis of cardiac prosthetic devices, implants and grafts, initial encounter: Principal | ICD-10-CM | POA: Diagnosis present

## 2021-04-18 DIAGNOSIS — F418 Other specified anxiety disorders: Secondary | ICD-10-CM | POA: Diagnosis present

## 2021-04-18 DIAGNOSIS — I5033 Acute on chronic diastolic (congestive) heart failure: Secondary | ICD-10-CM | POA: Diagnosis present

## 2021-04-18 DIAGNOSIS — I2510 Atherosclerotic heart disease of native coronary artery without angina pectoris: Secondary | ICD-10-CM | POA: Diagnosis present

## 2021-04-18 DIAGNOSIS — Z794 Long term (current) use of insulin: Secondary | ICD-10-CM

## 2021-04-18 DIAGNOSIS — Z7951 Long term (current) use of inhaled steroids: Secondary | ICD-10-CM

## 2021-04-18 DIAGNOSIS — E119 Type 2 diabetes mellitus without complications: Secondary | ICD-10-CM

## 2021-04-18 DIAGNOSIS — Z91013 Allergy to seafood: Secondary | ICD-10-CM

## 2021-04-18 DIAGNOSIS — Z9851 Tubal ligation status: Secondary | ICD-10-CM

## 2021-04-18 DIAGNOSIS — Z888 Allergy status to other drugs, medicaments and biological substances status: Secondary | ICD-10-CM

## 2021-04-18 DIAGNOSIS — Z885 Allergy status to narcotic agent status: Secondary | ICD-10-CM

## 2021-04-18 DIAGNOSIS — Z79899 Other long term (current) drug therapy: Secondary | ICD-10-CM

## 2021-04-18 DIAGNOSIS — Z9981 Dependence on supplemental oxygen: Secondary | ICD-10-CM

## 2021-04-18 HISTORY — PX: TRANSCATHETER AORTIC VALVE REPLACEMENT, TRANSFEMORAL: SHX6400

## 2021-04-18 HISTORY — PX: INTRAOPERATIVE TRANSTHORACIC ECHOCARDIOGRAM: SHX6523

## 2021-04-18 HISTORY — DX: Presence of prosthetic heart valve: Z95.2

## 2021-04-18 LAB — POCT I-STAT, CHEM 8
BUN: 16 mg/dL (ref 8–23)
Calcium, Ion: 1.16 mmol/L (ref 1.15–1.40)
Chloride: 99 mmol/L (ref 98–111)
Creatinine, Ser: 1 mg/dL (ref 0.44–1.00)
Glucose, Bld: 197 mg/dL — ABNORMAL HIGH (ref 70–99)
HCT: 22 % — ABNORMAL LOW (ref 36.0–46.0)
Hemoglobin: 7.5 g/dL — ABNORMAL LOW (ref 12.0–15.0)
Potassium: 4.1 mmol/L (ref 3.5–5.1)
Sodium: 136 mmol/L (ref 135–145)
TCO2: 28 mmol/L (ref 22–32)

## 2021-04-18 LAB — GLUCOSE, CAPILLARY
Glucose-Capillary: 165 mg/dL — ABNORMAL HIGH (ref 70–99)
Glucose-Capillary: 191 mg/dL — ABNORMAL HIGH (ref 70–99)

## 2021-04-18 LAB — POCT ACTIVATED CLOTTING TIME
Activated Clotting Time: 250 seconds
Activated Clotting Time: 128 seconds

## 2021-04-18 LAB — ECHO TEE
AR max vel: 1.97 cm2
AV Area VTI: 1.92 cm2
AV Area mean vel: 1.62 cm2
AV Mean grad: 20.3 mmHg
AV Peak grad: 34.9 mmHg
Ao pk vel: 2.95 m/s

## 2021-04-18 SURGERY — IMPLANTATION, AORTIC VALVE, TRANSCATHETER, FEMORAL APPROACH
Anesthesia: Monitor Anesthesia Care

## 2021-04-18 MED ORDER — SODIUM CHLORIDE 0.9 % IV SOLN: INTRAVENOUS | Status: DC

## 2021-04-18 MED ORDER — PANTOPRAZOLE SODIUM 40 MG PO TBEC
40.0000 mg | DELAYED_RELEASE_TABLET | Freq: Every morning | ORAL | Status: DC
  Administered 2021-04-19 – 2021-04-20 (×2): 40 mg via ORAL
  Filled 2021-04-18 (×2): qty 1

## 2021-04-18 MED ORDER — HEPARIN (PORCINE) IN NACL 1000-0.9 UT/500ML-% IV SOLN
INTRAVENOUS | Status: AC
  Filled 2021-04-18: qty 1500

## 2021-04-18 MED ORDER — PROPOFOL 500 MG/50ML IV EMUL
INTRAVENOUS | Status: DC | PRN
  Administered 2021-04-18: 25 ug/kg/min via INTRAVENOUS

## 2021-04-18 MED ORDER — CHLORHEXIDINE GLUCONATE 0.12 % MT SOLN
OROMUCOSAL | Status: AC
  Filled 2021-04-18: qty 15

## 2021-04-18 MED ORDER — SODIUM CHLORIDE 0.9 % IV SOLN: 250.0000 mL | INTRAVENOUS | Status: DC | PRN

## 2021-04-18 MED ORDER — PROPOFOL 10 MG/ML IV BOLUS
INTRAVENOUS | Status: DC | PRN
  Administered 2021-04-18: 80 mg via INTRAVENOUS

## 2021-04-18 MED ORDER — CEFAZOLIN SODIUM-DEXTROSE 2-4 GM/100ML-% IV SOLN
2.0000 g | Freq: Three times a day (TID) | INTRAVENOUS | Status: AC
  Administered 2021-04-18 (×2): 2 g via INTRAVENOUS
  Filled 2021-04-18 (×2): qty 100

## 2021-04-18 MED ORDER — MIDAZOLAM HCL 2 MG/2ML IJ SOLN
INTRAMUSCULAR | Status: DC | PRN
  Administered 2021-04-18 (×2): 1 mg via INTRAVENOUS

## 2021-04-18 MED ORDER — LACTATED RINGERS IV SOLN: INTRAVENOUS | Status: DC

## 2021-04-18 MED ORDER — FENTANYL CITRATE (PF) 250 MCG/5ML IJ SOLN
INTRAMUSCULAR | Status: DC | PRN
  Administered 2021-04-18 (×2): 50 ug via INTRAVENOUS

## 2021-04-18 MED ORDER — HEPARIN (PORCINE) IN NACL 1000-0.9 UT/500ML-% IV SOLN
INTRAVENOUS | Status: DC | PRN
  Administered 2021-04-18 (×3): 500 mL

## 2021-04-18 MED ORDER — LIDOCAINE HCL (PF) 1 % IJ SOLN
INTRAMUSCULAR | Status: AC
  Filled 2021-04-18: qty 30

## 2021-04-18 MED ORDER — CHLORHEXIDINE GLUCONATE 4 % EX LIQD
60.0000 mL | Freq: Once | CUTANEOUS | Status: DC
  Filled 2021-04-18: qty 60

## 2021-04-18 MED ORDER — TRAZODONE HCL 100 MG PO TABS
200.0000 mg | ORAL_TABLET | Freq: Every day | ORAL | Status: DC
  Administered 2021-04-18 – 2021-04-19 (×2): 200 mg via ORAL
  Filled 2021-04-18 (×2): qty 2

## 2021-04-18 MED ORDER — ONDANSETRON HCL 4 MG/2ML IJ SOLN
INTRAMUSCULAR | Status: DC | PRN
  Administered 2021-04-18 (×2): 4 mg via INTRAVENOUS

## 2021-04-18 MED ORDER — SODIUM CHLORIDE 0.9% FLUSH: 3.0000 mL | INTRAVENOUS | Status: DC | PRN

## 2021-04-18 MED ORDER — CLEVIDIPINE BUTYRATE 0.5 MG/ML IV EMUL
INTRAVENOUS | Status: DC | PRN
  Administered 2021-04-18: 2 mg/h via INTRAVENOUS

## 2021-04-18 MED ORDER — NITROGLYCERIN IN D5W 200-5 MCG/ML-% IV SOLN: 0.0000 ug/min | INTRAVENOUS | Status: DC

## 2021-04-18 MED ORDER — ESMOLOL HCL 100 MG/10ML IV SOLN
INTRAVENOUS | Status: DC | PRN
  Administered 2021-04-18: 20 mg via INTRAVENOUS
  Administered 2021-04-18: 30 mg via INTRAVENOUS

## 2021-04-18 MED ORDER — PROTAMINE SULFATE 10 MG/ML IV SOLN
INTRAVENOUS | Status: DC | PRN
  Administered 2021-04-18: 20 mg via INTRAVENOUS
  Administered 2021-04-18: 110 mg via INTRAVENOUS

## 2021-04-18 MED ORDER — SODIUM CHLORIDE 0.9% FLUSH
3.0000 mL | Freq: Two times a day (BID) | INTRAVENOUS | Status: DC
  Administered 2021-04-18 – 2021-04-20 (×4): 3 mL via INTRAVENOUS

## 2021-04-18 MED ORDER — CLOPIDOGREL BISULFATE 75 MG PO TABS
75.0000 mg | ORAL_TABLET | Freq: Every day | ORAL | Status: DC
  Administered 2021-04-19 – 2021-04-20 (×2): 75 mg via ORAL
  Filled 2021-04-18 (×2): qty 1

## 2021-04-18 MED ORDER — HEPARIN SODIUM (PORCINE) 1000 UNIT/ML IJ SOLN
INTRAMUSCULAR | Status: DC | PRN
  Administered 2021-04-18: 13000 [IU] via INTRAVENOUS

## 2021-04-18 MED ORDER — ALPRAZOLAM 0.5 MG PO TABS
0.5000 mg | ORAL_TABLET | Freq: Three times a day (TID) | ORAL | Status: DC | PRN
  Administered 2021-04-18 (×2): 0.5 mg via ORAL
  Filled 2021-04-18 (×2): qty 1

## 2021-04-18 MED ORDER — DEXAMETHASONE SODIUM PHOSPHATE 10 MG/ML IJ SOLN
INTRAMUSCULAR | Status: DC | PRN
  Administered 2021-04-18: 10 mg via INTRAVENOUS

## 2021-04-18 MED ORDER — PHENYLEPHRINE 40 MCG/ML (10ML) SYRINGE FOR IV PUSH (FOR BLOOD PRESSURE SUPPORT)
PREFILLED_SYRINGE | INTRAVENOUS | Status: DC | PRN
  Administered 2021-04-18: 200 ug via INTRAVENOUS

## 2021-04-18 MED ORDER — CLEVIDIPINE BUTYRATE 0.5 MG/ML IV EMUL
INTRAVENOUS | Status: AC
  Filled 2021-04-18: qty 50

## 2021-04-18 MED ORDER — FLUOXETINE HCL 20 MG PO CAPS
40.0000 mg | ORAL_CAPSULE | Freq: Two times a day (BID) | ORAL | Status: DC
  Administered 2021-04-18 – 2021-04-20 (×4): 40 mg via ORAL
  Filled 2021-04-18 (×4): qty 2

## 2021-04-18 MED ORDER — ISOSORBIDE MONONITRATE ER 30 MG PO TB24
30.0000 mg | ORAL_TABLET | Freq: Every day | ORAL | Status: DC
  Administered 2021-04-19 – 2021-04-20 (×2): 30 mg via ORAL
  Filled 2021-04-18 (×2): qty 1

## 2021-04-18 MED ORDER — DULOXETINE HCL 20 MG PO CPEP
20.0000 mg | ORAL_CAPSULE | Freq: Every morning | ORAL | Status: DC
  Administered 2021-04-19 – 2021-04-20 (×2): 20 mg via ORAL
  Filled 2021-04-18 (×2): qty 1

## 2021-04-18 MED ORDER — LEVOTHYROXINE SODIUM 25 MCG PO TABS
125.0000 ug | ORAL_TABLET | Freq: Every day | ORAL | Status: DC
  Administered 2021-04-19 – 2021-04-20 (×2): 125 ug via ORAL
  Filled 2021-04-18 (×2): qty 1

## 2021-04-18 MED ORDER — EPHEDRINE SULFATE-NACL 50-0.9 MG/10ML-% IV SOSY
PREFILLED_SYRINGE | INTRAVENOUS | Status: DC | PRN
  Administered 2021-04-18 (×2): 5 mg via INTRAVENOUS

## 2021-04-18 MED ORDER — SUGAMMADEX SODIUM 200 MG/2ML IV SOLN
INTRAVENOUS | Status: DC | PRN
  Administered 2021-04-18: 200 mg via INTRAVENOUS

## 2021-04-18 MED ORDER — ASPIRIN EC 81 MG PO TBEC
81.0000 mg | DELAYED_RELEASE_TABLET | Freq: Every day | ORAL | Status: DC
  Administered 2021-04-19 – 2021-04-20 (×2): 81 mg via ORAL
  Filled 2021-04-18 (×2): qty 1

## 2021-04-18 MED ORDER — HYDROCODONE-ACETAMINOPHEN 5-325 MG PO TABS
1.0000 | ORAL_TABLET | ORAL | Status: DC | PRN
  Administered 2021-04-18: 2 via ORAL
  Administered 2021-04-18: 1 via ORAL
  Administered 2021-04-19 – 2021-04-20 (×4): 2 via ORAL
  Filled 2021-04-18: qty 2
  Filled 2021-04-18: qty 1
  Filled 2021-04-18 (×4): qty 2

## 2021-04-18 MED ORDER — SODIUM CHLORIDE 0.9 % IV SOLN
INTRAVENOUS | Status: AC
  Administered 2021-04-18: 50 mL/h via INTRAVENOUS

## 2021-04-18 MED ORDER — CHLORHEXIDINE GLUCONATE 4 % EX LIQD: 60.0000 mL | Freq: Once | CUTANEOUS | Status: DC

## 2021-04-18 MED ORDER — MUPIROCIN 2 % EX OINT
1.0000 "application " | TOPICAL_OINTMENT | Freq: Two times a day (BID) | CUTANEOUS | Status: DC
  Filled 2021-04-18: qty 22

## 2021-04-18 MED ORDER — MUPIROCIN 2 % EX OINT
TOPICAL_OINTMENT | CUTANEOUS | Status: AC
  Administered 2021-04-18: 1 via TOPICAL
  Filled 2021-04-18: qty 22

## 2021-04-18 MED ORDER — CHLORHEXIDINE GLUCONATE 4 % EX LIQD
30.0000 mL | CUTANEOUS | Status: DC
  Filled 2021-04-18: qty 30

## 2021-04-18 MED ORDER — ROCURONIUM BROMIDE 10 MG/ML (PF) SYRINGE
PREFILLED_SYRINGE | INTRAVENOUS | Status: DC | PRN
  Administered 2021-04-18: 60 mg via INTRAVENOUS

## 2021-04-18 MED ORDER — GUAIFENESIN ER 600 MG PO TB12: 1200.0000 mg | ORAL_TABLET | Freq: Two times a day (BID) | ORAL | Status: DC

## 2021-04-18 MED ORDER — ONDANSETRON HCL 4 MG/2ML IJ SOLN
INTRAMUSCULAR | Status: AC
  Filled 2021-04-18: qty 2

## 2021-04-18 SURGICAL SUPPLY — 33 items
BAG SNAP BAND KOVER 36X36 (MISCELLANEOUS) ×8 IMPLANT
BLANKET WARM UNDERBOD FULL ACC (MISCELLANEOUS) ×4 IMPLANT
CABLE ADAPT PACING TEMP 12FT (ADAPTER) ×2 IMPLANT
CATH 23 ULTRA DELIVERY (CATHETERS) ×2 IMPLANT
CATH DIAG 6FR PIGTAIL ANGLED (CATHETERS) ×4 IMPLANT
CATH INFINITI 6F AL1 (CATHETERS) ×2 IMPLANT
CATH S G BIP PACING (CATHETERS) ×2 IMPLANT
CLOSURE MYNX CONTROL 6F/7F (Vascular Products) ×2 IMPLANT
CLOSURE PERCLOSE PROSTYLE (VASCULAR PRODUCTS) ×4 IMPLANT
CRIMPER (MISCELLANEOUS) ×2 IMPLANT
DEVICE INFLATION ATRION QL2530 (MISCELLANEOUS) ×2 IMPLANT
ELECT DEFIB PAD ADLT CADENCE (PAD) ×2 IMPLANT
GUIDEWIRE SAFE TJ AMPLATZ EXST (WIRE) ×2 IMPLANT
KIT HEART LEFT (KITS) ×4 IMPLANT
KIT MICROPUNCTURE NIT STIFF (SHEATH) ×2 IMPLANT
KIT SAPIAN 3 ULTRA RESILIA 23 (Valve) ×2 IMPLANT
PACK CARDIAC CATHETERIZATION (CUSTOM PROCEDURE TRAY) ×4 IMPLANT
SHEATH BRITE TIP 7FR 35CM (SHEATH) ×2 IMPLANT
SHEATH INTRODUCER SET 20-26 (SHEATH) ×2 IMPLANT
SHEATH PINNACLE 6F 10CM (SHEATH) ×2 IMPLANT
SHEATH PINNACLE 8F 10CM (SHEATH) ×2 IMPLANT
SHEATH PROBE COVER 6X72 (BAG) ×2 IMPLANT
SLEEVE REPOSITIONING LENGTH 30 (MISCELLANEOUS) ×2 IMPLANT
STOPCOCK MORSE 400PSI 3WAY (MISCELLANEOUS) ×8 IMPLANT
SYR MEDRAD MARK V 150ML (SYRINGE) ×2 IMPLANT
TRANSDUCER W/STOPCOCK (MISCELLANEOUS) ×8 IMPLANT
TUBING ART PRESS 72  MALE/FEM (TUBING) ×3
TUBING ART PRESS 72 MALE/FEM (TUBING) IMPLANT
TUBING CONTRAST HIGH PRESS 48 (TUBING) ×2 IMPLANT
WIRE AMPLATZ SS-J .035X180CM (WIRE) ×2 IMPLANT
WIRE EMERALD 3MM-J .035X150CM (WIRE) ×2 IMPLANT
WIRE EMERALD 3MM-J .035X260CM (WIRE) ×2 IMPLANT
WIRE EMERALD ST .035X260CM (WIRE) ×2 IMPLANT

## 2021-04-18 NOTE — Op Note (Signed)
[facility]   MULTIDISCIPLINARY HEART VALVE TEAM   TAVR OPERATIVE NOTE   Date of Procedure:  04/18/2021  Preoperative Diagnosis: Severe Aortic Stenosis   Postoperative Diagnosis: Same   Procedure:   Transcatheter Aortic Valve in Valve Replacement - Percutaneous Right Transfemoral Approach  Wolke Sapien 3 Ultra Resilia THV (size 23 mm, model # [id number], serial # [id number])   Co-Surgeons:  [name], MD and [name], MD   Anesthesiologist:  Raechel Ache, MD  Echocardiographer:  Osborne Oman, MD  Pre-operative Echo Findings: Severe aortic stenosis Normal left ventricular systolic function  Post-operative Echo Findings: No paravalvular leak Normal left ventricular systolic function   BRIEF CLINICAL NOTE AND INDICATIONS FOR SURGERY  This 75 year old woman has stage D, severe, symptomatic aortic stenosis with New York Heart Association class III-IV symptoms of exertional fatigue and shortness of breath with some symptoms at rest.  I suspect that her shortness of breath is multifactorial due to obesity, severe COPD, diastolic heart failure and severe prosthetic aortic stenosis.  I have personally reviewed her 2D echocardiogram, cardiac catheterization, and CTA studies.  Her echocardiogram shows a mean gradient of 41 mmHg across the prosthetic aortic valve with restricted leaflet mobility.  Left ventricular systolic function is normal with severe LVH.  Cardiac catheterization showed severe native three-vessel coronary disease with chronic total occlusion of the mid LAD and mid RCA and a patent stent in the mid left circumflex.  The saphenous vein graft to the RCA is patent with 2 stents.  There is a patent LIMA to the LAD.  PA pressure is 37/16 with a wedge pressure of 15 and a cardiac index of 1.93.  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  I do  not think she is a candidate for redo open surgical aortic valve replacement.  She has a 21 mm Magna-Ease which is likely suitable for a 23 mm Trueheart SAPIEN 3 valve.  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  The femoral and iliac vessels appear of adequate size to allow transfemoral insertion.   The patient and her husband were counseled at length regarding treatment alternatives for management of severe symptomatic aortic stenosis. The risks and benefits of surgical intervention has been discussed in detail. Long-term prognosis with medical therapy was discussed. Alternative approaches such as conventional surgical aortic valve replacement, transcatheter aortic valve replacement, and palliative medical therapy were compared and contrasted at length. This discussion was placed in the context of the patient's own specific clinical presentation and past medical history. All of their questions have been addressed.    Following the decision to proceed with transcatheter aortic valve replacement, a discussion was held regarding what types of management strategies would be attempted intraoperatively in the event of life-threatening complications, including whether or not the patient would be considered a candidate for the use of cardiopulmonary bypass and/or conversion to open sternotomy for attempted surgical intervention.  I do not think she is a candidate for emergent sternotomy to manage any intraoperative complications.  The patient is aware of the fact that transient use of cardiopulmonary bypass may be necessary. The patient has been advised of a variety of complications that might develop including but not limited to risks of death, stroke, paravalvular leak, aortic dissection or other major vascular complications, aortic annulus  rupture, device embolization, cardiac rupture or perforation,  mitral regurgitation, acute myocardial infarction, arrhythmia, heart block or bradycardia requiring permanent pacemaker placement, congestive heart failure, respiratory failure, renal failure, pneumonia, infection, other late complications related to structural valve deterioration or migration, or other complications that might ultimately cause a temporary or permanent loss of functional independence or other long term morbidity. The patient provides full informed consent for the procedure as described and all questions were answered.      DETAILS OF THE OPERATIVE PROCEDURE  PREPARATION:    The patient was brought to the operating room on the above mentioned date and appropriate monitoring was established by the anesthesia team. The patient was placed in the supine position on the operating table.  Intravenous antibiotics were administered. General endotracheal anesthesia was induced uneventfully due to inability for the patient to lie flat.    Baseline transesophageal echocardiogram was performed. The patient's abdomen and both groins were prepped and draped in a sterile manner. A time out procedure was performed.   PERIPHERAL ACCESS:    Using the modified Seldinger technique, femoral arterial and venous access was obtained with placement of 6 Fr sheaths on the left side.  A pigtail diagnostic catheter was passed through the left arterial sheath under fluoroscopic guidance into the aortic root.  A temporary transvenous pacemaker catheter was passed through the left femoral venous sheath under fluoroscopic guidance into the right ventricle.  The pacemaker was tested to ensure stable lead placement and pacemaker capture.  TRANSFEMORAL ACCESS:   Percutaneous transfemoral access and sheath placement was performed using ultrasound guidance.  The right common femoral artery was cannulated using a micropuncture needle and appropriate location was verified using hand injection angiogram.  A pair of  Abbott Perclose percutaneous closure devices were placed and a 6 French sheath replaced into the femoral artery.  The patient was heparinized systemically and ACT verified > 250 seconds.    A 14 Fr transfemoral E-sheath was introduced into the right common femoral artery after progressively dilating over an Amplatz superstiff wire. An AL-1 catheter was used to direct a straight-tip exchange length wire across the native aortic valve into the left ventricle. This was exchanged out for a pigtail catheter and position was confirmed in the LV apex. Simultaneous LV and Ao pressures were recorded.  The pigtail catheter was exchanged for an Amplatz Extra-stiff wire in the LV apex.   BALLOON AORTIC VALVULOPLASTY:   Not performed    TRANSCATHETER HEART VALVE DEPLOYMENT:   An Glazer Sapien 3 Ultra transcatheter heart valve (size 23 mm) was prepared and crimped per manufacturer's guidelines, and the proper orientation of the valve is confirmed on the [employer] delivery system. The valve was advanced through the introducer sheath using normal technique until in an appropriate position in the abdominal aorta beyond the sheath tip. The balloon was then retracted and using the fine-tuning wheel was centered on the valve. The valve was then advanced across the aortic arch using appropriate flexion of the catheter. The valve was carefully positioned across the aortic valve annulus. The Commander catheter was retracted using normal technique. Once final position of the valve has been confirmed by fluoroscopic assessment, the valve is deployed while temporarily holding ventilation and during rapid ventricular pacing to maintain systolic blood pressure < 50 mmHg and pulse pressure < 10 mmHg. The balloon inflation is held for >3 seconds after reaching full deployment volume. Once the balloon has fully deflated the balloon is retracted into the ascending aorta and  valve function is assessed using echocardiography.  There is felt to be no paravalvular leak and trivial central aortic insufficiency.  The patient's hemodynamic recovery following valve deployment is good.  The deployment balloon and guidewire are both removed.    PROCEDURE COMPLETION:   The sheath was removed and femoral artery closure performed.  Protamine was administered once femoral arterial repair was complete. The temporary pacemaker, pigtail catheters and femoral sheaths were removed with manual pressure used for hemostasis.  A Mynx femoral closure device was utilized following removal of the diagnostic sheath in the left femoral artery.  The patient tolerated the procedure well and is transported to the cath lab recovery area in stable condition. There were no immediate intraoperative complications. All sponge instrument and needle counts are verified correct at completion of the operation.   No blood products were administered during the operation.  The patient received no intravenous contrast during the procedure.   [name], MD 04/18/2021

## 2021-04-18 NOTE — CV Procedure (Signed)
[facility]  TAVR OPERATIVE NOTE   Date of Procedure:  04/18/2021  Preoperative Diagnosis: Severe Aortic Stenosis   Postoperative Diagnosis: Same   Procedure:   Transcatheter Aortic Valve Replacement - Transfemoral Approach  Mowbray Sapien 3 THV (size 23 mm, model # [id number], serial # [id number])   Co-Surgeons:  [name], MD and [name], MD   Anesthesiologist:  Fransisco Beau  Echocardiographer:  Gasper Sells  Pre-operative Echo Findings: Severe aortic stenosis Normal left ventricular systolic function  Post-operative Echo Findings: No paravalvular leak Normal left ventricular systolic function  BRIEF CLINICAL NOTE AND INDICATIONS FOR SURGERY  75 yo female with history of anemia, HTN, HLD, DM2, severe COPD on home O2, CAD s/p prior stent placements followed by CABG and AVR May 2015 and recent PCI with severe AS secondary to failure of her bioprosthetic AVR who is here today for TAVR. Severe dyspnea in setting of severe AS and severe COPD. CAD stable by recent cath. Echo here at [facility] 03/01/21 with LVEF=60-65%, severe LVH. Normal RV size and function. Severe MAC with trivial MR. The 21 mm bioprosthetic AVR has severe stenosis with mean gradient 41 mmHg, peak gradient 70 mmHg.    During the course of the patient's preoperative work up they have been evaluated comprehensively by a multidisciplinary team of specialists coordinated through the [facility] in the [facility].  They have been demonstrated to suffer from symptomatic severe aortic stenosis as noted above. The patient has been counseled extensively as to the relative risks and benefits of all options for the treatment of severe aortic stenosis including long term medical therapy, conventional surgery for aortic valve replacement, and transcatheter aortic valve replacement.  The patient has been independently evaluated by Dr. Cyndia Bent with CT surgery and  they are felt to be at high risk for conventional surgical aortic valve replacement. The surgeon indicated the patient would be a poor candidate for conventional surgery. Based upon review of all of the patient's preoperative diagnostic tests they are felt to be candidate for transcatheter aortic valve replacement using the transfemoral approach as an alternative to high risk conventional surgery.    Following the decision to proceed with transcatheter aortic valve replacement, a discussion has been held regarding what types of management strategies would be attempted intraoperatively in the event of life-threatening complications, including whether or not the patient would be considered a candidate for the use of cardiopulmonary bypass and/or conversion to open sternotomy for attempted surgical intervention.  The patient has been advised of a variety of complications that might develop peculiar to this approach including but not limited to risks of death, stroke, paravalvular leak, aortic dissection or other major vascular complications, aortic annulus rupture, device embolization, cardiac rupture or perforation, acute myocardial infarction, arrhythmia, heart block or bradycardia requiring permanent pacemaker placement, congestive heart failure, respiratory failure, renal failure, pneumonia, infection, other late complications related to structural valve deterioration or migration, or other complications that might ultimately cause a temporary or permanent loss of functional independence or other long term morbidity.  The patient provides full informed consent for the procedure as described and all questions were answered preoperatively.    DETAILS OF THE OPERATIVE PROCEDURE  PREPARATION:   The patient is brought to the operating room on the above mentioned date and central monitoring was established by the anesthesia team including placement of a radial arterial line. The patient is placed in the supine  position on the operating table.  Intravenous antibiotics are administered. General anesthesia is used.   Baseline transesophageal echocardiogram was performed. The patient's chest, abdomen, both groins, and both lower extremities are prepared and draped in a sterile manner. A time out procedure is performed.   PERIPHERAL ACCESS:   Using the modified Seldinger technique, femoral arterial and venous access were obtained with placement of a 6 Fr sheath in the artery and a 7 Fr sheath in the vein on the left side using u/s guidance.  A pigtail diagnostic catheter was passed through the femoral arterial sheath under fluoroscopic guidance into the aortic root.  A temporary transvenous pacemaker catheter was passed through the femoral venous sheath under fluoroscopic guidance into the right ventricle.  The pacemaker was tested to ensure stable lead placement and pacemaker capture.   TRANSFEMORAL ACCESS:  A micropuncture kit was used to gain access to the right femoral artery using u/s guidance. Position confirmed with angiography. Pre-closure with double ProGlide closure devices. The patient was heparinized systemically and ACT verified > 250 seconds.    A 14 Fr transfemoral E-sheath was introduced into the right femoral artery after progressively dilating over an Amplatz superstiff wire. An AL-1 catheter was used to direct a straight-tip exchange length wire across the native aortic valve into the left ventricle. This was exchanged out for a pigtail catheter and position was confirmed in the LV apex. Simultaneous LV and Ao pressures were recorded.  The pigtail catheter was then exchanged for an Amplatz Extra-stiff wire in the LV apex.   TRANSCATHETER HEART VALVE DEPLOYMENT:  An Mikes Sapien 3 THV (size 23 mm) was prepared and crimped per manufacturer's guidelines, and the proper orientation of the valve is confirmed on the [employer] delivery system. The valve was advanced through the introducer  sheath using normal technique until in an appropriate position in the abdominal aorta beyond the sheath tip. The balloon was then retracted and using the fine-tuning wheel was centered on the valve. The valve was then advanced across the aortic arch using appropriate flexion of the catheter. The valve was carefully positioned across the aortic valve annulus. The Commander catheter was retracted using normal technique. Once final position of the valve has been confirmed by angiographic assessment within the previously placed surgical valve, the valve is deployed while temporarily holding ventilation and during rapid ventricular pacing to maintain systolic blood pressure < 50 mmHg and pulse pressure < 10 mmHg. The balloon inflation is held for >3 seconds after reaching full deployment volume. Once the balloon has fully deflated the balloon is retracted into the ascending aorta and valve function is assessed using TEE. There is felt to be no paravalvular leak and no central aortic insufficiency.  The patient's hemodynamic recovery following valve deployment is good.  The deployment balloon and guidewire are both removed. Echo demostrated acceptable post-procedural gradients, stable mitral valve function, and trivial central AI.   PROCEDURE COMPLETION:  The sheath was then removed and closure devices were completed. Protamine was administered once femoral arterial repair was complete. The temporary pacemaker, pigtail catheters and femoral sheaths were removed with a Mynx closure device placed in the artery and manual pressure used for venous hemostasis.    The patient tolerated the procedure well and is transported to the surgical intensive care in stable condition. There were no immediate intraoperative complications. All sponge instrument and needle counts are verified correct at completion of the operation.   No blood products were administered during the operation.  The patient received no intravenous  contrast during the procedure.  [name] MD 04/18/2021 11:31 AM

## 2021-04-18 NOTE — Interval H&P Note (Signed)
History and Physical Interval Note:  04/18/2021 8:17 AM  [name]  has presented today for surgery, with the diagnosis of Severe Bioprosthetic Aortic Valve Stenosis.  The various methods of treatment have been discussed with the patient and family. After consideration of risks, benefits and other options for treatment, the patient has consented to  Procedure(s): TRANSCATHETER AORTIC VALVE REPLACEMENT, TRANSFEMORAL (N/A) INTRAOPERATIVE TRANSTHORACIC ECHOCARDIOGRAM (N/A) as a surgical intervention.  The patient's history has been reviewed, patient examined, no change in status, stable for surgery.  I have reviewed the patient's chart and labs.  Questions were answered to the patient's satisfaction.     [name]

## 2021-04-18 NOTE — Discharge Instructions (Addendum)
ACTIVITY AND EXERCISE  Daily activity and exercise are an important part of your recovery. People recover at different rates depending on their general health and type of valve procedure.  Most people recovering from TAVR feel better relatively quickly   No lifting, pushing, pulling more than 10 pounds (examples to avoid: groceries, vacuuming, gardening, golfing):             - For one week with a procedure through the groin.             - For six weeks for procedures through the chest wall or neck. NOTE: You will typically see one of our providers 7-14 days after your procedure to discuss WHEN TO RESUME the above activities.      DRIVING  Do not drive until you are seen for follow up and cleared by a provider. Generally, we ask patient to not drive for 1 week after their procedure.  If you have been told by your doctor in the past that you may not drive, you must talk with him/her before you begin driving again.   DRESSING  Groin site: you may leave the clear dressing over the site for up to one week or until it falls off.   HYGIENE  If you had a femoral (leg) procedure, you may take a shower when you return home. After the shower, pat the site dry. Do NOT use powder, oils or lotions in your groin area until the site has completely healed.  If you had a chest procedure, you may shower when you return home unless specifically instructed not to by your discharging practitioner.             - DO NOT scrub incision; pat dry with a towel.             - DO NOT apply any lotions, oils, powders to the incision.             - No tub baths / swimming for at least 2 weeks.  If you notice any fevers, chills, increased pain, swelling, bleeding or pus, please contact your doctor.   ADDITIONAL INFORMATION  If you are going to have an upcoming dental procedure, please contact our office as you will require antibiotics ahead of time to prevent infection on your heart valve.    If you have any questions  or concerns you can call the structural heart phone during normal business hours 8am-4pm. If you have an urgent need after hours or weekends please call [phone] to talk to the on call provider for general cardiology. If you have an emergency that requires immediate attention, please call 911.    After TAVR Checklist  Check  Test Description   Follow up appointment in 1-2 weeks  You will see our structural heart advanced practice provider. Your incision sites will be checked and you will be cleared to drive and resume all normal activities if you are doing well.     1 month echo and follow up  You will have an echo to check on your new heart valve and be seen back in the office by a structural heart advanced practice provider.   Follow up with your primary cardiologist You will need to be seen by your primary cardiologist in the following 3-6 months after your 1 month appointment in the valve clinic. Often times your Plavix or Aspirin will be discontinued during this time, but this is decided on a case by case basis.      1 year echo and follow up You will have another echo to check on your heart valve after 1 year and be seen back in the office by a structural heart advanced practice provider. This your last structural heart visit.   Bacterial endocarditis prophylaxis  You will have to take antibiotics for the rest of your life before all dental procedures (even teeth cleanings) to protect your heart valve. Antibiotics are also required before some surgeries. Please check with your cardiologist before scheduling any surgeries. Also, please make sure to tell us if you have a penicillin allergy as you will require an alternative antibiotic.     

## 2021-04-18 NOTE — Anesthesia Preprocedure Evaluation (Addendum)
Anesthesia Evaluation  Patient identified by MRN, date of birth, ID band Patient awake    Reviewed: Allergy & Precautions, NPO status , Patient's Chart, lab work & pertinent test results  History of Anesthesia Complications (+) PONV and history of anesthetic complications  Airway Mallampati: IV  TM Distance: >3 FB Neck ROM: Full    Dental  (+) Edentulous Upper, Edentulous Lower   Pulmonary asthma , sleep apnea , COPD,  oxygen dependent, former smoker,    Pulmonary exam normal        Cardiovascular hypertension, Pt. on medications + CAD, + Past MI, + Cardiac Stents, + CABG, + Peripheral Vascular Disease and +CHF  + dysrhythmias + Valvular Problems/Murmurs AS  Rhythm:Regular Rate:Normal + Systolic murmurs  '22 TTE - EF 60 to 65%. Severe left ventricular hypertrophy. Left atrial size was mildly dilated. Trivial mitral valve regurgitation. Mild mitral stenosis. Severe mitral annular calcification. Aortic valve regurgitation is mild. Severe aortic valve stenosis. 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 Procedure Date: 10/12/13. There is mild dilatation of the aortic root, measuring 40 mm. There is mild dilatation of the ascending aorta, measuring 41 mm.     Neuro/Psych  Headaches, PSYCHIATRIC DISORDERS Anxiety Depression    GI/Hepatic Neg liver ROS, PUD, GERD  Medicated and Controlled,  Endo/Other  diabetes, Type 2, Insulin DependentHypothyroidism  Obesity   Renal/GU Renal InsufficiencyRenal disease     Musculoskeletal  (+) Arthritis ,   Abdominal   Peds  Hematology  (+) anemia ,   Anesthesia Other Findings   Reproductive/Obstetrics                           Anesthesia Physical Anesthesia Plan  ASA: 4  Anesthesia Plan: MAC   Post-op Pain Management:    Induction:   PONV Risk Score and Plan: 3 and Propofol infusion and Treatment may vary due to age or  medical condition  Airway Management Planned: Natural Airway and Simple Face Mask  Additional Equipment: Arterial line  Intra-op Plan:   Post-operative Plan:   Informed Consent: I have reviewed the patients History and Physical, chart, labs and discussed the procedure including the risks, benefits and alternatives for the proposed anesthesia with the patient or authorized representative who has indicated his/her understanding and acceptance.       Plan Discussed with: CRNA and Anesthesiologist  Anesthesia Plan Comments:       Anesthesia Quick Evaluation

## 2021-04-18 NOTE — Progress Notes (Signed)
  Echocardiogram 2D Echocardiogram has been performed.  Darlina Sicilian M 04/18/2021, 11:20 AM

## 2021-04-18 NOTE — Progress Notes (Signed)
Mobility Specialist Progress Note:   04/18/21 1611  Mobility  Activity Contraindicated/medical hold   RN deferred mobility d/t recent anxiety which led to Ativan dose. Mobility to f/u first thing in the am.   Nelta Numbers Mobility Specialist  Phone [phone]

## 2021-04-18 NOTE — Progress Notes (Signed)
Patient came to unit from cath lab after TAVR. She is awake and oriented. Bilateral groin sites are dressed with gauze. Both sides clean, dry and intact. No hematomas. VS stable. CHG bath complete. Fuller Canada, RN

## 2021-04-18 NOTE — Transfer of Care (Signed)
Immediate Anesthesia Transfer of Care Note  Patient: [name]  Procedure(s) Performed: TRANSCATHETER AORTIC VALVE REPLACEMENT, TRANSFEMORAL INTRAOPERATIVE TRANSTHORACIC ECHOCARDIOGRAM  Patient Location: Cath Lab  Anesthesia Type:General  Level of Consciousness: drowsy  Airway & Oxygen Therapy: Patient Spontanous Breathing and Patient connected to nasal cannula oxygen  Post-op Assessment: Report given to RN and Post -op Vital signs reviewed and stable  Post vital signs: Reviewed and stable  Last Vitals:  Vitals Value Taken Time  BP 130/51 04/18/21 1128  Temp    Pulse 72 04/18/21 1133  Resp 16 04/18/21 1133  SpO2 99 % 04/18/21 1133  Vitals shown include unvalidated device data.  Last Pain:  Vitals:   04/18/21 0718  TempSrc: Oral         Complications: No notable events documented.

## 2021-04-18 NOTE — Progress Notes (Signed)
  [facility] VALVE TEAM  Patient doing well s/p TAVR. She is hemodynamically stable. Groin sites stable. ECG with sinus with 1st deg av block (old) and no high grade block. Arterial line discontinued and transferred to 4E. Plan for early ambulation after bedrest completed and hopeful discharge over the next 24-48 hours.   Angelena Form PA-C  [facility]  Pager [phone]

## 2021-04-18 NOTE — Anesthesia Postprocedure Evaluation (Signed)
Anesthesia Post Note  Patient: DARNETTA KESSELMAN  Procedure(s) Performed: TRANSCATHETER AORTIC VALVE REPLACEMENT, TRANSFEMORAL INTRAOPERATIVE TRANSTHORACIC ECHOCARDIOGRAM     Patient location during evaluation: PACU Anesthesia Type: General Level of consciousness: awake and alert Pain management: pain level controlled Vital Signs Assessment: post-procedure vital signs reviewed and stable Respiratory status: spontaneous breathing, nonlabored ventilation and respiratory function stable Cardiovascular status: blood pressure returned to baseline and stable Postop Assessment: no apparent nausea or vomiting Anesthetic complications: no   No notable events documented.  Last Vitals:  Vitals:   04/18/21 1215 04/18/21 1300  BP: (!) 137/55 (!) 136/46  Pulse: 81 78  Resp: 15 20  Temp:  36.4 C  SpO2: 100% 92%    Last Pain:  Vitals:   04/18/21 1300  TempSrc: Oral                 Audry Pili

## 2021-04-18 NOTE — Anesthesia Procedure Notes (Signed)
Procedure Name: Intubation Date/Time: 04/18/2021 9:56 AM Performed by: Bryson Corona, CRNA Pre-anesthesia Checklist: Patient identified, Emergency Drugs available, Suction available and Patient being monitored Patient Re-evaluated:Patient Re-evaluated prior to induction Oxygen Delivery Method: Circle System Utilized Preoxygenation: Pre-oxygenation with 100% oxygen Induction Type: IV induction Ventilation: Mask ventilation without difficulty Laryngoscope Size: Mac and 3 Grade View: Grade I Tube type: Oral Tube size: 7.0 mm Number of attempts: 1 Airway Equipment and Method: Stylet Placement Confirmation: ETT inserted through vocal cords under direct vision, positive ETCO2 and breath sounds checked- equal and bilateral Secured at: 21 cm Tube secured with: Tape Dental Injury: Teeth and Oropharynx as per pre-operative assessment

## 2021-04-18 NOTE — Anesthesia Procedure Notes (Signed)
Arterial Line Insertion Start/End11/15/2022 8:30 AM, 04/18/2021 8:35 AM Performed by: Bryson Corona, CRNA, CRNA  Patient location: Pre-op. Preanesthetic checklist: patient identified, IV checked, site marked, risks and benefits discussed, surgical consent, monitors and equipment checked, pre-op evaluation, timeout performed and anesthesia consent Lidocaine 1% used for infiltration Right, radial was placed Catheter size: 20 G Hand hygiene performed  and maximum sterile barriers used   Attempts: 1 Procedure performed without using ultrasound guided technique. Following insertion, dressing applied and Biopatch. Post procedure assessment: normal and unchanged  Patient tolerated the procedure well with no immediate complications.

## 2021-04-19 ENCOUNTER — Inpatient Hospital Stay (HOSPITAL_COMMUNITY): Payer: Medicare HMO

## 2021-04-19 ENCOUNTER — Encounter (HOSPITAL_COMMUNITY): Payer: Self-pay | Admitting: Cardiovascular Disease

## 2021-04-19 DIAGNOSIS — Z952 Presence of prosthetic heart valve: Secondary | ICD-10-CM

## 2021-04-19 DIAGNOSIS — T82857A Stenosis of cardiac prosthetic devices, implants and grafts, initial encounter: Secondary | ICD-10-CM | POA: Diagnosis not present

## 2021-04-19 DIAGNOSIS — Z006 Encounter for examination for normal comparison and control in clinical research program: Secondary | ICD-10-CM | POA: Diagnosis not present

## 2021-04-19 DIAGNOSIS — I350 Nonrheumatic aortic (valve) stenosis: Secondary | ICD-10-CM

## 2021-04-19 DIAGNOSIS — I130 Hypertensive heart and chronic kidney disease with heart failure and stage 1 through stage 4 chronic kidney disease, or unspecified chronic kidney disease: Secondary | ICD-10-CM | POA: Diagnosis not present

## 2021-04-19 LAB — POCT ACTIVATED CLOTTING TIME: Activated Clotting Time: 121 seconds

## 2021-04-19 LAB — ECHOCARDIOGRAM COMPLETE
AR max vel: 1.17 cm2
AV Area VTI: 1.39 cm2
AV Area mean vel: 1.23 cm2
AV Mean grad: 32 mmHg
AV Peak grad: 51.7 mmHg
Ao pk vel: 3.6 m/s
Area-P 1/2: 2.96 cm2
Calc EF: 57.3 %
Height: 66 in
MV VTI: 1.74 cm2
S' Lateral: 3 cm
Single Plane A2C EF: 59.9 %
Single Plane A4C EF: 57.1 %
Weight: 3439.18 oz

## 2021-04-19 LAB — CBC
HCT: 23.1 % — ABNORMAL LOW (ref 36.0–46.0)
Hemoglobin: 7.1 g/dL — ABNORMAL LOW (ref 12.0–15.0)
MCH: 26.2 pg (ref 26.0–34.0)
MCHC: 30.7 g/dL (ref 30.0–36.0)
MCV: 85.2 fL (ref 80.0–100.0)
Platelets: 196 10*3/uL (ref 150–400)
RBC: 2.71 MIL/uL — ABNORMAL LOW (ref 3.87–5.11)
RDW: 14.6 % (ref 11.5–15.5)
WBC: 11.9 10*3/uL — ABNORMAL HIGH (ref 4.0–10.5)
nRBC: 0.2 % (ref 0.0–0.2)

## 2021-04-19 LAB — BASIC METABOLIC PANEL
Anion gap: 8 (ref 5–15)
BUN: 19 mg/dL (ref 8–23)
CO2: 26 mmol/L (ref 22–32)
Calcium: 8.1 mg/dL — ABNORMAL LOW (ref 8.9–10.3)
Chloride: 95 mmol/L — ABNORMAL LOW (ref 98–111)
Creatinine, Ser: 1.21 mg/dL — ABNORMAL HIGH (ref 0.44–1.00)
GFR, Estimated: 46 mL/min — ABNORMAL LOW (ref >60)
Glucose, Bld: 381 mg/dL — ABNORMAL HIGH (ref 70–99)
Potassium: 5.1 mmol/L (ref 3.5–5.1)
Sodium: 129 mmol/L — ABNORMAL LOW (ref 135–145)

## 2021-04-19 LAB — HEMOGLOBIN AND HEMATOCRIT, BLOOD
HCT: 24.3 % — ABNORMAL LOW (ref 36.0–46.0)
Hemoglobin: 7.4 g/dL — ABNORMAL LOW (ref 12.0–15.0)

## 2021-04-19 LAB — MAGNESIUM: Magnesium: 1.9 mg/dL (ref 1.7–2.4)

## 2021-04-19 LAB — PREPARE RBC (CROSSMATCH)

## 2021-04-19 MED ORDER — FUROSEMIDE 10 MG/ML IJ SOLN
20.0000 mg | Freq: Once | INTRAMUSCULAR | Status: AC
  Administered 2021-04-19: 20 mg via INTRAVENOUS
  Filled 2021-04-19: qty 2

## 2021-04-19 MED ORDER — SODIUM CHLORIDE 0.9% IV SOLUTION: Freq: Once | INTRAVENOUS | Status: AC

## 2021-04-19 NOTE — Progress Notes (Signed)
Pt is alert and fully oriented x 4. She was very anxious with some anxiety about her new onset of right shoulder pain. Stated that is a new onset for her.  She has been able to tolerate pain well after Norco given. She requested Xanax at bedtime and she slept well after that.  Left and right groins are negative for bleeding or hematoma.  We will monitor.   She is hemodynamically stable. Remains afebrile. Sinus rhythm on the monitor with 1st degree AVB. On 1 LPM on 1 O2 NCL. SPO2 92-100%. No acute distress.   Kennyth Lose, RN

## 2021-04-19 NOTE — Progress Notes (Signed)
CARDIAC REHAB PHASE I   Pt with recent walk with mobility team. Pt states she can not feel a difference in her breathing at this point. Pt educated on site care and restrictions. Encouraged continued ambulation with emphasis on safety. Pt declines CRP II at this time.  [id number] Rufina Falco, RN BSN 04/19/2021 11:46 AM

## 2021-04-19 NOTE — Progress Notes (Signed)
HEART AND [place]                                     Cardiology Office Note:    Date:  04/27/2021   ID:  [name], DOB Nov 11, 1945, MRN [id number]  PCP:  Rusty Aus, MD  [facility] HeartCare Cardiologist:  Dr. Clayborn Bigness ([facility])/ Dr. Angelena Form & Dr. Cyndia Bent (TAVR)   [facility] HeartCare Electrophysiologist:  None   Referring MD: Rusty Aus, MD   Chief Complaint  Patient presents with   Follow-up    1 week s/p TAVR    History of Present Illness:    [name] is a 75 y.o. 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    She was admitted to [facility] in 2017 with unstable angina and cardiac catheterization showed a patent LIMA to the LAD and patent saphenous vein graft to the RCA with progression of in-stent restenosis in the proximal left circumflex that was treated with Cutting Balloon angioplasty.  She presented again in April 2019 feeling poorly with chest pressure, shortness of breath, fatigue, and dizziness.  Repeat catheterization showed patent bypass grafts and a patent left circumflex stent with mild restenosis. Echo showed a normal functioning bioprosthetic aortic valve at that time.  She was admitted to [facility] with unstable angina in June 2022 and had stenting of the left circumflex and the saphenous vein graft to the RCA.  Repeat catheterization on 02/10/2021 showed stable coronary disease with chronic total occlusion of the mid LAD and mid RCA with patent left circumflex stents with moderately severe distal AV groove stenosis with both bypass grafts patent. Echocardiogram on 03/01/2021 showed severe  LVH with an ejection fraction at 60 to 65% with a severely stenotic prosthetic aortic valve, mean gradient of 41 mmHg and a peak gradient of 70 mmHg. She continued to report severe shortness of breath with any activity as well as at rest.    The patient was evaluated by the multidisciplinary valve team and felt to have severe, symptomatic aortic stenosis and to be a suitable candidate for TAVR. She underwent a successful procedure with a 23 mm Silvestri Sapien 3 Ultra Resilia THV via the TF approach on 04/18/21. Post operative echo showed EF 60%, normally functioning TAVR with a mean gradient of 32 mmHg and trivial PVL. There was turbulent flow in the LVOT with evidence of the torn chord seen prior to TAVR implantation. Peak LVOT gradient was 8 mm Hg. Case re-discussed at follow up TAVR meeting with plan to continue with medical therapy at this time. No plans for further valve intervention. Post procedure ECG with sinus with 1st deg AV block (seen on previous tracings) and no high grade heart block. She was continued on Asprin and Plavix. Her Hb was marginal and she was transfused with 1 unit PRBCs to 8.4 prior to d/c.   Today she presents with her husband. She was initially in the waiting room and required an emesis bag due to nausea and active vomiting. Husband  reports that she gets "motion sickness" and will sometimes get nauseated when traveling. On arrival to the exam room, she continued to be sick and reported that she was very SOB even in her wheelchair. She denies recent fever, chills or myalgias. She denies recent sick contacts. Both her and her husband state that she has had no improvement in her symptoms since her TAVR. She continues to have severe dyspnea with ambulation although she is very sedentary at baseline. She has severe anxiety which is likely playing a huge role in her persistent symptoms. She denies chest pain, palpitations, LE edema, orthopnea, dizziness, or syncope.  Past Medical  History:  Diagnosis Date   1st degree AV block    ACE-inhibitor cough    Allergic rhinitis    Anemia    iron deficiency anemia   Anxiety    Aortic ectasia (HCC)    a. CT abd in 12/2016 incidentally noted aortic atherosclerosis and infrarenal abdominal aortic ectasia measuring as large as 2.7 cm with recommendation to repeat US in 2023.   Arthritis    Asthma    Cataract    Chronic depression    Chronic diastolic CHF (congestive heart failure) (HCC)    Chronic headache    COPD (chronic obstructive pulmonary disease) (HCC)    Coronary artery disease    a. DES to RCA and mid Cx 2009. b. CABG and bioprosthetic AVR May 2015. c. cutting balloon to prox Cx in 05/2016   Diabetes mellitus    type 2   Diverticulitis of colon    Essential hypertension    GERD (gastroesophageal reflux disease)    Hearing loss    History of blood transfusion 2013   History of prosthetic aortic valve replacement    HOH (hard of hearing)    Hypercholesterolemia    intolerance of statins and niaspan   IDA (iron deficiency anemia) 02/03/2019   Mobitz type 1 second degree AV block    OSA (obstructive sleep apnea)    mild, intolerant of cpap   PAD (peripheral artery disease) ([place])    a. atherosclerosis by CT abd 12/2016 in LE.   PONV (postoperative nausea and vomiting)    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   s/p TAVR with a 23 mm Ciampa S3UR via the TF approach by Dr. Sumner Boast & Dr. Cyndia Bent   Statin intolerance    Thyroid disease     Past Surgical History:  Procedure Laterality Date   ABDOMINAL HYSTERECTOMY     ABDOMINAL HYSTERECTOMY W/ PARTIAL VAGINACTOMY     AORTIC VALVE REPLACEMENT N/A 10/12/2013   Procedure: AORTIC VALVE REPLACEMENT (AVR);  Surgeon: [name], MD;  Location: [place];  Service: Open Heart Surgery;  Laterality: N/A;   APPENDECTOMY  1964   BARTHOLIN GLAND CYST EXCISION     BLADDER SUSPENSION     BREAST BIOPSY Bilateral 09/11/2000   neg    BREAST BIOPSY Left 07/24/2010   neg   BREAST CYST EXCISION  1988   bilateral nonmalignant tumors, x3   CARDIAC CATHETERIZATION     CARDIAC CATHETERIZATION N/A 05/25/2016   Procedure: Coronary Balloon Angioplasty;  Surgeon: Leonie Man, MD;  Location: [place] [facility];  Service: Cardiovascular;  Laterality: N/A;   CARDIAC CATHETERIZATION N/A 05/25/2016   Procedure: Coronary/Graft Angiography;  Surgeon: Leonie Man, MD;  Location: [place] [facility];  Service: Cardiovascular;  Laterality: N/A;   CATARACT EXTRACTION W/ INTRAOCULAR LENS  IMPLANT, BILATERAL  CHOLECYSTECTOMY  2001   COLECTOMY     lap sigmoid   COLONOSCOPY  2014   polyps found, 2 clamped off.   CORONARY ANGIOPLASTY  10/29/2007   Prox RCA & Mid Cx.   CORONARY ARTERY BYPASS GRAFT N/A 10/12/2013   Procedure: CORONARY ARTERY BYPASS GRAFT TIMES TWO;  Surgeon: [name], MD;  Location: [place] [facility];  Service: Open Heart Surgery;  Laterality: N/A;   CORONARY/GRAFT ANGIOGRAPHY N/A 09/20/2017   Procedure: CORONARY/GRAFT ANGIOGRAPHY;  Surgeon: Sherren Mocha, MD;  Location: [place] [facility];  Service: Cardiovascular;  Laterality: N/A;   INTRAOPERATIVE TRANSTHORACIC ECHOCARDIOGRAM N/A 04/18/2021   Procedure: INTRAOPERATIVE TRANSTHORACIC ECHOCARDIOGRAM;  Surgeon: [name], MD;  Location: [place] [facility];  Service: Open Heart Surgery;  Laterality: N/A;   LEFT HEART CATHETERIZATION WITH CORONARY ANGIOGRAM N/A 10/09/2013   Procedure: LEFT HEART CATHETERIZATION WITH CORONARY ANGIOGRAM;  Surgeon: [name], MD;  Location: [facility] C[facility] LAB;  Service: Cardiovascular;  Laterality: N/A;   RIGHT/LEFT HEART CATH AND CORONARY ANGIOGRAPHY N/A 11/03/2020   Procedure: RIGHT/LEFT HEART CATH AND CORONARY ANGIOGRAPHY;  Surgeon: Corey Skains, MD;  Location: [place] [facility];  Service: Cardiovascular;  Laterality: N/A;   RIGHT/LEFT HEART CATH AND CORONARY/GRAFT ANGIOGRAPHY N/A 02/10/2021   Procedure: RIGHT/LEFT HEART CATH  AND CORONARY/GRAFT ANGIOGRAPHY;  Surgeon: Andrez Grime, MD;  Location: [place] [facility];  Service: Cardiovascular;  Laterality: N/A;   STERNAL WIRES REMOVAL N/A 04/13/2014   Procedure: STERNAL WIRES REMOVAL;  Surgeon: [name], MD;  Location: [place];  Service: Thoracic;  Laterality: N/A;   TEE WITHOUT CARDIOVERSION N/A 11/03/2020   Procedure: TRANSESOPHAGEAL ECHOCARDIOGRAM (TEE);  Surgeon: Corey Skains, MD;  Location: [facility];  Service: Cardiovascular;  Laterality: N/A;   TEE WITHOUT CARDIOVERSION N/A 02/13/2021   Procedure: TRANSESOPHAGEAL ECHOCARDIOGRAM (TEE);  Surgeon: Corey Skains, MD;  Location: [facility];  Service: Cardiovascular;  Laterality: N/A;   THYROIDECTOMY     TONSILLECTOMY     TRANSCATHETER AORTIC VALVE REPLACEMENT, TRANSFEMORAL N/A 04/18/2021   Procedure: TRANSCATHETER AORTIC VALVE REPLACEMENT, TRANSFEMORAL;  Surgeon: [name], MD;  Location: [place] [facility];  Service: Open Heart Surgery;  Laterality: N/A;   TUBAL LIGATION     VAGINAL DELIVERY     3   VISCERAL ARTERY INTERVENTION N/A 08/16/2016   Procedure: Visceral Artery Intervention;  Surgeon: Algernon Huxley, MD;  Location: [place] [facility];  Service: Cardiovascular;  Laterality: N/A;    Current Medications: Current Meds  Medication Sig   acetaminophen (TYLENOL) 500 MG tablet Take 500-1,000 mg by mouth every 6 (six) hours as needed for mild pain.   albuterol (PROVENTIL) (2.5 MG/3ML) 0.083% nebulizer solution Take 2.5 mg by nebulization every 6 (six) hours as needed for wheezing or shortness of breath.   ANORO ELLIPTA 62.5-25 MCG/INH AEPB Inhale 1 puff into the lungs daily as needed (SOB).   aspirin EC 81 MG EC tablet Take 1 tablet (81 mg total) by mouth daily. Swallow whole.   budesonide (PULMICORT) 1 MG/2ML nebulizer solution Inhale 2 mLs into the lungs at bedtime.   clopidogrel (PLAVIX) 75 MG tablet Take 75 mg by mouth daily.   DULoxetine (CYMBALTA) 20 MG capsule Take 1 capsule  (20 mg total) by mouth in the morning.   FLUoxetine (PROZAC) 40 MG capsule Take 40 mg by mouth 2 (two) times daily.   Insulin Glargine (BASAGLAR KWIKPEN) 100 UNIT/ML Inject 100 Units into the skin at bedtime.   insulin  lispro (HUMALOG) 100 UNIT/ML injection Inject 36-66 Units into the skin 3 (three) times daily before meals.   isosorbide mononitrate (IMDUR) 30 MG 24 hr tablet Take 1 tablet (30 mg total) by mouth daily.   levothyroxine (SYNTHROID, LEVOTHROID) 125 MCG tablet Take 125 mcg by mouth daily before breakfast.   LORazepam (ATIVAN) 0.5 MG tablet Take 0.5 mg by mouth daily.   losartan (COZAAR) 25 MG tablet Take 1 tablet (25 mg total) by mouth in the morning.   nitroGLYCERIN (NITROSTAT) 0.4 MG SL tablet Place 1 tablet (0.4 mg total) under the tongue every 5 (five) minutes as needed for chest pain.   ondansetron (ZOFRAN-ODT) 4 MG disintegrating tablet Take 4 mg by mouth 3 (three) times daily as needed for nausea or vomiting.   pantoprazole (PROTONIX) 40 MG tablet Take 40 mg by mouth in the morning.   promethazine (PHENERGAN) 25 MG tablet Take 1 tablet by mouth 3 (three) times daily as needed.   torsemide (DEMADEX) 20 MG tablet Take 20 mg by mouth daily.   traZODone (DESYREL) 100 MG tablet Take 200 mg by mouth at bedtime.     Allergies:   Amitriptyline, Benadryl [diphenhydramine], Demerol [meperidine], Gabapentin, Mirtazapine, Olanzapine, Voltaren [diclofenac sodium], Zetia [ezetimibe], Ativan [lorazepam], Atorvastatin, Budesonide-formoterol fumarate, Bupropion hcl, Caffeine, Codeine sulfate, Lisinopril, Metformin, Mometasone furoate, Morphine sulfate, Other, Oxycodone-acetaminophen, Pioglitazone, Propoxyphene n-acetaminophen, Rosuvastatin, Shellfish allergy, Suvorexant, Ticagrelor, Tramadol, Venlafaxine, Xanax [alprazolam], Zolpidem tartrate, and Latex   Social History   Socioeconomic History   Marital status: Married    Spouse name: Not on file   Number of children: 3   Years of  education: Not on file   Highest education level: Not on file  Occupational History   Occupation: Retired    Fish farm manager: UNEMPLOYED    Comment: CNA  Tobacco Use   Smoking status: Former    Packs/day: 0.50    Years: 30.00    Pack years: 15.00    Types: Cigarettes    Quit date: 10/02/2013    Years since quitting: 7.5   Smokeless tobacco: Never  Vaping Use   Vaping Use: Never used  Substance and Sexual Activity   Alcohol use: No   Drug use: No   Sexual activity: Not Currently  Other Topics Concern   Not on file  Social History Narrative   Does not have Living Will   Desires CPR, would not want prolonged life support if futile.   Social Determinants of Health   Financial Resource Strain: Not on file  Food Insecurity: Not on file  Transportation Needs: Not on file  Physical Activity: Not on file  Stress: Not on file  Social Connections: Not on file     Family History: The patient's family history includes Asthma in her father; Breast cancer in her maternal aunt and paternal aunt; Breast cancer (age of onset: 3) in her mother; Heart disease in an other family member; Hypertension in her father; Mesothelioma in her father; Stroke in her paternal grandfather.  ROS:   Please see the history of present illness.    All other systems reviewed and are negative.  EKGs/Labs/Other Studies Reviewed:    The following studies were reviewed today:  TAVR OPERATIVE NOTE     Date of Procedure:                04/18/2021   Preoperative Diagnosis:      Severe Aortic Stenosis    Postoperative Diagnosis:    Same    Procedure:  Transcatheter Aortic Valve in Valve Replacement - Percutaneous Right Transfemoral Approach             [place] Sapien 3 Ultra Resilia THV (size 23 mm, model # 9755RSL, serial # [id number])              Co-Surgeons:                        [name], MD and [name], MD     Anesthesiologist:                  Raechel Ache, MD    Echocardiographer:              Osborne Oman, MD   Pre-operative Echo Findings: Severe aortic stenosis Normal left ventricular systolic function   Post-operative Echo Findings: No paravalvular leak Normal left ventricular systolic function   _____________  Echocardiogram 04/19/21:   1. There is a 23 mm Ojeda Sapien prosthetic (TAVR) valve present in the  aortic position. Effective orifice area is , by VTI measures 1.39 cm.  Aortic valve mean gradient measures 32.0 mmHg. Peak gradient is 55 mm Hg.  Aortic valve Vmax measures 3.60  m/s. Aortic valve acceleration time measures 96 msec. DVI is 0.44. There  is trivial PVL.   2. There is turbulent flow in the LVOT with evidence of the torn chord  seen prior to TAVR implantation. Peak LVOT gradient is 8 mm Hg.   3. Left ventricular ejection fraction, by estimation, is 60 to 65%. The  left ventricle has normal function. The left ventricle has no regional  wall motion abnormalities. There is moderate concentric left ventricular  hypertrophy. Left ventricular  diastolic parameters are indeterminate.   4. Right ventricular systolic function is normal. The right ventricular  size is normal. There is mildly elevated pulmonary artery systolic  pressure. The estimated right ventricular systolic pressure is 92.9 mmHg.   5. Left atrial size was severely dilated.   6. The mitral valve is abnormal. Mild mitral valve regurgitation. Mild to  moderate mitral stenosis. The mean mitral valve gradient is 8.0 mmHg with  average heart rate of 75 bpm.   7. Tricuspid valve regurgitation is mild to moderate.   8. The inferior vena cava is dilated in size with >50% respiratory  variability, suggesting right atrial pressure of 8 mmHg.   Comparison(s): A prior study was performed on 04/18/21. Improvement in AV  gradients from pre-implantation.   EKG:  EKG is ordered today. The ekg ordered today demonstrates NSR with HR 80, no acute change from prior  tracings.   Recent Labs: 02/01/2021: TSH 7.959 04/14/2021: ALT 13; B Natriuretic Peptide 427.9 04/19/2021: Magnesium 1.9 04/26/2021: BUN 13; Creatinine, Ser 1.13; Hemoglobin 8.2; Platelets 177; Potassium 4.4; Sodium 139  Recent Lipid Panel    Component Value Date/Time   CHOL 173 09/20/2017 0338   CHOL 211 (H) 10/03/2013 0515   TRIG 207 (H) 09/20/2017 0338   TRIG 272 (H) 10/03/2013 0515   HDL 22 (L) 09/20/2017 0338   HDL 22 (L) 10/03/2013 0515   CHOLHDL 7.9 09/20/2017 0338   VLDL 41 (H) 09/20/2017 0338   VLDL 54 (H) 10/03/2013 0515   LDLCALC 110 (H) 09/20/2017 0338   LDLCALC 135 (H) 10/03/2013 0515   LDLDIRECT 127.0 07/07/2014 0820   Physical Exam:    VS:  BP (!) 130/58   Pulse 80   Ht 5\' 5"  (1.651 m)   Wt 215  lb (97.5 kg)   SpO2 94%   BMI 35.78 kg/m     Wt Readings from Last 3 Encounters:  04/26/21 215 lb (97.5 kg)  04/20/21 218 lb 0.6 oz (98.9 kg)  04/14/21 210 lb 9.6 oz (95.5 kg)    General: Anxious appearing, NAD Lungs:Clear to ausculation bilaterally. No wheezes, rales, or rhonchi. Breathing is unlabored. Cardiovascular: RRR with S1 S2. + murmur Extremities: Stable edema. Neuro: Alert and oriented. No focal deficits. No facial asymmetry. MAE spontaneously. Psych: Responds to questions appropriately with normal affect.    ASSESSMENT/PLAN:    Severe AS: s/p successful valve in valve TAVR with a 23 mm [place]on Sapien 3 Ultra Resilia THV via the TF approach on 04/18/21. Post operative echo showed EF 60%, normally functioning TAVR with a mean gradient of 32 mmHg and trivial PVL. There is turbulent flow in the LVOT with evidence of the torn chord seen prior to TAVR implantation. Peak LVOT gradient is 8 mm Hg. Case discussed at follow up TAVR meeting with no plans for further valve intervention. Continues to have significant dyspnea with ambulation and rest despite some improvement in valve gradients on post procedure echo. Unclear if she will ever be symptom free or even  close to that. Seems that severe anxiety is playing a huge role in her persistent symptoms. Will obtain CBC and BMET today to follow Hb and Cr. ADDEND: Cr stable at 1.13. Hb stable at 8.2. EKG stable with no significant change. Continue Asprin and Plavix as previously RXd. Keep follow up in one month post procedure with follow up echo.    Chronic diastolic CHF: BNP elevated on PAT labs with cardiomegaly and interstitial prominence in the lungs on CXR. She was treated with IV Lasix after blood transfusion and resumed on PTA Torsemide. Appears euvolemic today    Chronic anemia: Hb stable at 8.2 today. Groin sites stable. No concern about active bleeding.    CAD: underwent PCI to LCx and SVG to RCA in 11/2020 in the  setting of unstable angina. Cath 02/2021 showed severe native three-vessel coronary artery disease including CTO of the mid LAD, CTO of the mid RCA, and patent stent in the mid left circumflex, patent SVG to RCA with 2 patent stents in the proximal and distal segments, patent LIMA to LAD. Continue DAPT for at least 1 year. No anginal symptoms.    HTN: Stable today with no changes    DMT2: Resumed on PTA meds at discharge.   CKD stage IIIa: Creatinine today at 1.13 which is baseline.    Anxiety: Debilitating. Needs to follow with PCP. Continue current regimen. Likely needs further titration.   Medication Adjustments/Labs and Tests Ordered: Current medicines are reviewed at length with the patient today.  Concerns regarding medicines are outlined above.  Orders Placed This Encounter  Procedures   Flu Vaccine QUAD High Dose(Fluad)   Basic metabolic panel   CBC   EKG 12-Lead   No orders of the defined types were placed in this encounter.   Patient Instructions  Medication Instructions:  Your physician recommends that you continue on your current medications as directed. Please refer to the Current Medication list given to you today.  *If you need a refill on your cardiac  medications before your next appointment, please call your pharmacy*   Lab Work: CBC and BMET If you have labs (blood work) drawn today and your tests are completely normal, you will receive your results only by: [place] (if you have  MyChart) OR A paper copy in the mail If you have any lab test that is abnormal or we need to change your treatment, we will call you to review the results.   Testing/Procedures: None ordered.    Follow-Up: At [facility], you and your health needs are our priority.  As part of our continuing mission to provide you with exceptional heart care, we have created designated Provider Care Teams.  These Care Teams include your primary Cardiologist (physician) and Advanced Practice Providers (APPs -  Physician Assistants and Nurse Practitioners) who all work together to provide you with the care you need, when you need it.  We recommend signing up for the patient portal called "MyChart".  Sign up information is provided on this After Visit Summary.  MyChart is used to connect with patients for Virtual Visits (Telemedicine).  Patients are able to view lab/test results, encounter notes, upcoming appointments, etc.  Non-urgent messages can be sent to your provider as well.   To learn more about what you can do with MyChart, go to [email or web address].    Your next appointment:   As scheduled   Signed, Kathyrn Drown, NP  04/27/2021 9:59 AM    [place]

## 2021-04-19 NOTE — Progress Notes (Signed)
Inpatient Diabetes Program Recommendations  AACE/ADA: New Consensus Statement on Inpatient Glycemic Control (2015)  Target Ranges:  Prepandial:   less than 140 mg/dL      Peak postprandial:   less than 180 mg/dL (1-2 hours)      Critically ill patients:  140 - 180 mg/dL   Lab Results  Component Value Date   GLUCAP 191 (H) 04/18/2021   HGBA1C 6.2 (H) 04/14/2021    Review of Glycemic Control Results for [name], [name] (MRN [id number]) as of 04/19/2021 08:54  Ref. Range 04/19/2021 01:30  Glucose Latest Ref Range: 70 - 99 mg/dL 381 (H)   Diabetes history: Type 2 Dm Outpatient Diabetes medications: Basaglar 100 units QHS, Humalog 36-66 units TID Current orders for Inpatient glycemic control: none Decadron 10 mg x 1  Inpatient Diabetes Program Recommendations:    Patient is followed by outpatient endocrinology, Dr Gabriel Carina with last appointment 03/03/21. A1C has improved since that visit.   At this time, Am serum 381 mg/dL. Patient has received steroids.   Consider: -Adding Semglee 50 units QHS -Novolog 0-15 units TID & HS -Novolog 5 units TID (assuming patient is consuming >50% of meals)  Thanks, Bronson Curb, MSN, RNC-OB Diabetes Coordinator [phone] (8a-5p)

## 2021-04-19 NOTE — Plan of Care (Signed)
  Problem: Clinical Measurements: Goal: Ability to maintain clinical measurements within normal limits will improve Outcome: Progressing Goal: Will remain free from infection Outcome: Progressing   Problem: Activity: Goal: Risk for activity intolerance will decrease Outcome: Progressing   Problem: Coping: Goal: Level of anxiety will decrease Outcome: Progressing   

## 2021-04-19 NOTE — Discharge Summary (Addendum)
[facility]VE TEAM  Discharge Summary    Patient ID: [name] MRN: [id number]; DOB: 17-Mar-1946  Admit date: 04/18/2021 Discharge date: 04/19/2021  Primary Care Provider: Rusty Aus, MD  Primary Cardiologist: Dr. Clayborn Bigness Jefm Bryant) / Dr. Angelena Form & Dr. 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   Dyslipidemia-statin ontol   Acute on chronic diastolic (congestive) heart failure (HCC)   Severe aortic stenosis   Allergies Allergies  Allergen Reactions   Amitriptyline Other (See Comments)    Unknown reaction   Benadryl [Diphenhydramine] Shortness Of Breath   Demerol [Meperidine] Other (See Comments)    Unknown reaction   Gabapentin Other (See Comments)    Unknown reaction   Mirtazapine Other (See Comments)    Unknown reaction   Olanzapine Other (See Comments)    Unknown reaction    Voltaren [Diclofenac Sodium] Shortness Of Breath   Zetia [Ezetimibe] Other (See Comments)    Weakness in legs, shakiness all over   Ativan [Lorazepam] Other (See Comments)    Causes double vision at highter than .5 mg dose   Atorvastatin Other (See Comments)    Muscle aches and weakness   Budesonide-Formoterol Fumarate Other (See Comments)    Shakiness, tremors   Bupropion Hcl Other (See Comments)    "cloud over me" depression   Caffeine Other (See Comments)    jitters   Codeine Sulfate Other (See Comments)    Makes chest hurt like a heart attack   Lisinopril Cough   Metformin Nausea And Vomiting   Mometasone Furoate Nausea And Vomiting   Morphine Sulfate Other (See Comments)    Chest pain like a heart attack    Other Other (See Comments)    Beta Blockers, reaction shortness of breath   Oxycodone-Acetaminophen Nausea And Vomiting   Pioglitazone Other (See Comments)    Cannot take because of risk of bladder cancer   Propoxyphene N-Acetaminophen Nausea And Vomiting   Rosuvastatin Other (See Comments)    Muscle aches and weakness   Shellfish Allergy Diarrhea   Suvorexant Other (See Comments)    Jerking/nervous    Ticagrelor     Other reaction(s): Other (See Comments) "slowed heart rate" & chest pain   Tramadol Nausea Only   Venlafaxine Other (See Comments)    Unknown reaction   Xanax [Alprazolam] Other (See Comments)    Mental status changes, "didn't know where she was at" per husband at bedside   Zolpidem Tartrate Other (See Comments)     Jittery, diarrhea   Latex Rash    Diagnostic Studies/Procedures    TAVR OPERATIVE NOTE     Date of Procedure:                04/18/2021   Preoperative Diagnosis:      Severe Aortic Stenosis    Postoperative Diagnosis:    Same    Procedure:        Transcatheter Aortic Valve in Valve Replacement - Percutaneous Right Transfemoral Approach             Ogletree Sapien 3 Ultra Resilia THV (size 23 mm, model #  9755RSL, serial # [id number])              Co-Surgeons:                        [name], MD and [name], MD     Anesthesiologist:                  Raechel Ache, MD   Echocardiographer:              Osborne Oman, MD   Pre-operative Echo Findings: Severe aortic stenosis Normal left ventricular systolic function   Post-operative Echo Findings: No paravalvular leak Normal left ventricular systolic function   _____________    Echo 04/18/2021: completed but pending formal read at the time of discharge   History of Present Illness     [name] is a 75 y.o. 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   She was admitted to [facility] in 2017 with unstable angina and cardiac catheterization showed a patent LIMA to the LAD and patent saphenous vein graft to the RCA with progression of in-stent restenosis in the proximal left circumflex that was treated with Cutting Balloon angioplasty.  She presented again in April 2019 feeling poorly with chest pressure, shortness of breath, fatigue, and dizziness.  Repeat catheterization showed patent bypass grafts and a patent left circumflex stent with mild restenosis.  Echo showed a normal functioning bioprosthetic aortic valve at that time.  She was admitted to [facility] with unstable angina in June 2022 and had stenting of the left circumflex and the saphenous vein graft to the RCA.  Repeat catheterization on 02/10/2021 showed stable coronary disease with chronic total occlusion of the mid LAD and mid RCA with patent left circumflex stents with moderately severe distal AV groove stenosis with both bypass grafts patent. Echo on 02/13/2021 showed severe LVH.  Ejection fraction was 50 to 55%.  The bioprosthetic aortic valve was poorly visualized but appeared calcified and stenotic.  Her most recent echocardiogram on 03/01/2021 showed severe LVH with an ejection fraction of 60 to 65%.  The prosthetic aortic valve had severe stenosis with a mean gradient of 41 mmHg and a peak gradient of 70 mmHg.  She continued to report severe shortness of breath with any activity as well as at rest.  She is mostly sedentary at home because she cannot get up and walk without getting short of breath.   The patient has been evaluated by the multidisciplinary valve team and felt to have severe, symptomatic aortic stenosis and to be a suitable candidate for TAVR, which was set up  for 04/18/2021.   Hospital Course     Consultants: none   Severe AS: s/p successful valve in valve TAVR with a 23 mm Blumstein Sapien 3 Ultra Resilia THV via the TF approach on 04/18/21. Post operative echo completed but pending formal read. Groin sites are stable. ECG with sinus with 1st deg AV block (seen on previous tracings) and no high grade heart block. Continue Asprin and Plavix. Plan for discharge home with close follow up in the office next  week.  Acute on chronic diastolic CHF: as evidenced by an elevated BNP on pre admission lab work and cardiomegaly and interstitial prominence in the lungs on CXR. This has been treated with TAVR as well as one dose of IV lasix after blood transfusion. Resume home Torsemide. BMET next week.  Acute on chronic anemia: hg 9.1--> 7.1. Groin sites look good. Likely 2/2 hemodilution and surgery. Given 1U PRBCs. Recheck CBC at follow up.    CAD: underwent PCI to LCx and SVG to RCA in 11/2020 in the setting of unstable angina. Cath 02/2021 showed severe native three-vessel coronary artery disease including CTO of the mid LAD, CTO of the mid RCA, and patent stent in the mid left circumflex, patent SVG to RCA with 2 patent stents in the proximal and distal segments, patent LIMA to LAD. Continue DAPT for at least 1 year.   HTN: BP mildly elevated. Resume home meds.   DMT2: treated with SSI while admitted. Resume home meds at discharge.  CKD stage IIIa: creat remained stable ~1.2  _____________  Discharge Vitals Blood pressure (!) 142/58, pulse 70, temperature 98 F (36.7 C), temperature source Oral, resp. rate 17, height 5\' 6"  (1.676 m), weight 97.5 kg, SpO2 98 %.  Filed Weights   04/18/21 0718 04/19/21 0254  Weight: 95.3 kg 97.5 kg     GEN: Well nourished, well developed, in no acute distress, obese HEENT: normal Neck: no JVD or masses Cardiac: RRR; soft murmurs. No rubs, or gallops,no edema  Respiratory:  clear to auscultation bilaterally, normal  work of breathing GI: soft, nontender, nondistended, + BS MS: no deformity or atrophy Skin: warm and dry, no rash.  Groin sites clear without hematoma or ecchymosis  Neuro:  Alert and Oriented x 3, Strength and sensation are intact Psych: euthymic mood, full affect   Labs & Radiologic Studies    CBC Recent Labs    04/18/21 1146 04/19/21 0130  WBC  --  11.9*  HGB 7.5* 7.1*  HCT 22.0* 23.1*  MCV  --  85.2  PLT  --  014   Basic Metabolic Panel Recent Labs    04/18/21 1146 04/19/21 0130  NA 136 129*  K 4.1 5.1  CL 99 95*  CO2  --  26  GLUCOSE 197* 381*  BUN 16 19  CREATININE 1.00 1.21*  CALCIUM  --  8.1*  MG  --  1.9   Liver Function Tests No results for input(s): AST, ALT, ALKPHOS, BILITOT, PROT, ALBUMIN in the last 72 hours. No results for input(s): LIPASE, AMYLASE in the last 72 hours. Cardiac Enzymes No results for input(s): CKTOTAL, CKMB, CKMBINDEX, TROPONINI in the last 72 hours. BNP Invalid input(s): POCBNP D-Dimer No results for input(s): DDIMER in the last 72 hours. Hemoglobin A1C No results for input(s): HGBA1C in the last 72 hours. Fasting Lipid Panel No results for input(s): CHOL, HDL, LDLCALC, TRIG, CHOLHDL, LDLDIRECT in the last 72 hours. Thyroid Function Tests No results for input(s): TSH, T4TOTAL, T3FREE, THYROIDAB in the last 72 hours.  Invalid input(s): FREET3 _____________  DG Chest 2 View  Result Date: 04/17/2021 CLINICAL DATA:  Preoperative assessment. EXAM: CHEST - 2 VIEW COMPARISON:  04/06/2021. FINDINGS: The heart is enlarged and the mediastinal structures are stable. Sternotomy wires are noted over the midline and there is a prosthetic cardiac valve. A cardiac stent is noted. Mild atherosclerotic calcification of the aorta is present. Interstitial prominence is noted in the lungs bilaterally, unchanged from the prior exam. No consolidation,  effusion, or pneumothorax. Surgical changes are noted at the upper abdomen. No acute osseous  abnormality. IMPRESSION: 1. No acute process. 2. Stable cardiomegaly and interstitial prominence in the lungs. . Electronically Signed   By: Brett Fairy M.D.   On: 04/17/2021 04:00   DG Chest 2 View  Result Date: 04/06/2021 CLINICAL DATA:  75 year old female with recurrent tachycardia. EXAM: CHEST - 2 VIEW COMPARISON:  Chest radiographs 04/03/2021 and earlier. FINDINGS: Stable cardiomegaly. Prior CABG. Stable tortuous thoracic aorta. Prior cardiac valve replacement. Visualized tracheal air column is within normal limits. Lower lung volumes. Chronic increased pulmonary interstitial markings, not significantly changed since 2019. No pneumothorax, pleural effusion or acute pulmonary opacity. No acute osseous abnormality identified. Stable bowel gas pattern and visible surgical clips in the upper abdomen. IMPRESSION: Cardiomegaly and chronic lung disease. No acute cardiopulmonary abnormality. Electronically Signed   By: Genevie Ann M.D.   On: 04/06/2021 10:47   DG Chest 2 View  Result Date: 04/03/2021 CLINICAL DATA:  Chest pain, dyspnea EXAM: CHEST - 2 VIEW COMPARISON:  04/01/2021 FINDINGS: Stable pulmonary insufflation. Trace interstitial pulmonary infiltrate has slightly improved in the interval since the prior examination, though has not yet completely resolved most in keeping with mild diffuse interstitial pulmonary edema. No pneumothorax or pleural effusion. Cardiomegaly is stable. Coronary artery bypass grafting has been performed. No acute bone abnormality. IMPRESSION: Improved, trace interstitial pulmonary edema. Electronically Signed   By: Fidela Salisbury M.D.   On: 04/03/2021 02:49   DG Chest 2 View  Result Date: 04/01/2021 CLINICAL DATA:  Dyspnea EXAM: CHEST - 2 VIEW COMPARISON:  03/21/2021 FINDINGS: The lungs are symmetrically expanded. No confluent pulmonary infiltrate. Fine interstitial infiltrate is seen at the lung bases bilaterally which may represent mild interstitial pulmonary edema. No  pneumothorax or pleural effusion. Cardiomegaly is stable. Coronary artery bypass grafting and aortic valve replacement has been performed. IMPRESSION: Stable cardiomegaly. Interval development of mild interstitial pulmonary edema. Electronically Signed   By: Fidela Salisbury M.D.   On: 04/01/2021 02:15   DG Chest 2 View  Result Date: 03/21/2021 CLINICAL DATA:  chest pain EXAM: CHEST - 2 VIEW COMPARISON:  March 07, 2021 FINDINGS: Evaluation is limited secondary to patient rotation. The cardiomediastinal silhouette is unchanged and enlarged in contour.Status post median sternotomy with aortic valve replacement and CABG. Atherosclerotic calcifications. No pleural effusion. No pneumothorax. Diffuse interstitial prominence. Surgical clips project over the upper abdomen. IMPRESSION: Constellation of findings are favored to reflect pulmonary edema. Differential considerations include atypical infection. Electronically Signed   By: Valentino Saxon M.D.   On: 03/21/2021 13:55   ECHO TEE  Result Date: 04/18/2021    TRANSESOPHOGEAL ECHO REPORT   Patient Name:   [name] Date of Exam: 04/18/2021 Medical Rec #:  [id number]     Height:       66.0 in Accession #:    [id number]    Weight:       210.0 lb Date of Birth:  10-19-1945      BSA:          2.042 m Patient Age:    38 years      BP:           111/62 mmHg Patient Gender: F             HR:           81 bpm. Exam Location:  Inpatient Procedure: Transesophageal Echo, Color Doppler, Cardiac Doppler and 3D Echo Indications:  Aortic Stenosis  History          Patient has prior history of Echocardiogram examinations, most                  recent 03/01/2021. CAD, PAD, Carotid Disease and COPD, Aortic                  Valve Disease, Arrythmias:1st degree AV block; Risk                  Factors:Hypertension, Dyslipidemia, Former Smoker, Sleep Apnea                  and Diabetes.                  Aortic Valve: Valve in Valve TAVR. VALVE MAGNA EASE 21MM.                   Procedure Date 10/12/2013.                  23 mm Sapien prosthetic, stented (TAVR) valve is present inside                  the aortic bioprosthetic                   Procedure Date: 04/18/2021.  Sonographer:     Darlina Sicilian RDCS Referring Phys:  [place] Diagnosing Phys: Rudean Haskell MD PROCEDURE: After discussion of the risks and benefits of a TEE, an informed consent was obtained from the patient. The patient was intubated. The transesophogeal probe was passed without difficulty through the esophogus of the patient. Sedation performed  by different physician. The patient was monitored while under deep sedation. Anesthestetic sedation was provided intravenously by Anesthesiology: 111.98mg  of Propofol. Image quality was good. The patient's vital signs; including heart rate, blood pressure, and oxygen saturation; remained stable throughout the procedure. The patient developed no complications during the procedure. IMPRESSIONS  1. Prior to procedure there was a 21 mm Manga-Ease Valve present in the Aortic Position. There was mild to moderate central aortic regurgitation likely related to damaged prosthetic valve. There was severe aortic stenosis with mean gradient of 51 mmHg, peak gradient of 78 mmHg, DVI 0.31, EOA 0.98 cm2.  2. Post procedure there isa a 23 mm Sapien prosthetic (TAVR) valve present in the aortic position. The prosthetic torn chord from prior to TAVR is still visible in the LVOT and was presentl prior to implantation. There is trivial PVL but mild central AI. Post procedural mean gradients: mean gradient 22 mmHg, peak gradient 36 mmHg, DVI 0.57, EOA 1.79 cm2. Improvement in both aortic regurgitation and stenosis.  3. There is Severe, mobile (Grade V) plaque involving the descending aorta, aortic arch and ascending aorta.  4. Left ventricular ejection fraction, by estimation, is 60 to 65%. The left ventricle has normal function. There is severe concentric left ventricular  hypertrophy.  5. Right ventricular systolic function is normal. The right ventricular size is normal.  6. The mitral valve is abnormal. Mild mitral valve regurgitation. Mild mitral stenosis. The mean mitral valve gradient is 4.0 mmHg.  7. Left atrial size was moderately dilated. No left atrial/left atrial appendage thrombus was detected. FINDINGS  Left Ventricle: Left ventricular ejection fraction, by estimation, is 60 to 65%. The left ventricle has normal function. The left ventricular internal cavity size was normal in size. There is severe concentric left ventricular hypertrophy. Right Ventricle: The right ventricular  size is normal. Right vetricular wall thickness was not assessed. Right ventricular systolic function is normal. Left Atrium: Left atrial size was moderately dilated. No left atrial/left atrial appendage thrombus was detected. Right Atrium: Right atrial size was normal in size. Pericardium: Trivial pericardial effusion is present. Mitral Valve: The mitral valve is abnormal. Mild mitral valve regurgitation. Mild mitral valve stenosis. MV peak gradient, 7.5 mmHg. The mean mitral valve gradient is 4.0 mmHg. Tricuspid Valve: The tricuspid valve is normal in structure. Tricuspid valve regurgitation is mild. Aortic Valve: The aortic valve has been repaired/replaced. Aortic valve regurgitation is mild. Aortic valve mean gradient measures 20.2 mmHg. Aortic valve peak gradient measures 34.9 mmHg. Aortic valve area, by VTI measures 1.92 cm. 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 Procedure Date: 04/18/2021. Pulmonic Valve: The pulmonic valve was not well visualized. Pulmonic valve regurgitation is mild. Aorta: The aortic root is normal in size and structure. 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 IAS/Shunts: The interatrial septum appears to be lipomatous. The atrial septum is grossly normal.   LEFT VENTRICLE PLAX 2D LVOT diam:     2.00 cm LV SV:         118 LV SV Index:   58 LVOT Area:     3.14 cm  AORTIC VALVE AV Area (Vmax):    1.97 cm AV Area (Vmean):   1.62 cm AV Area (VTI):     1.92 cm AV Vmax:           295.25 cm/s AV Vmean:          208.000 cm/s AV VTI:            0.618 m AV Peak Grad:      34.9 mmHg AV Mean Grad:      20.2 mmHg LVOT Vmax:         185.00 cm/s LVOT Vmean:        107.000 cm/s LVOT VTI:          0.377 m LVOT/AV VTI ratio: 0.61 MITRAL VALVE MV Peak grad: 7.5 mmHg  SHUNTS MV Mean grad: 4.0 mmHg  Systemic VTI:  0.38 m MV Vmax:      1.37 m/s  Systemic Diam: 2.00 cm MV Vmean:     95.3 cm/s Rudean Haskell MD Electronically signed by Rudean Haskell MD Signature Date/Time: 04/18/2021/6:49:55 PM    Final    Structural Heart Procedure  Result Date: 04/18/2021 See surgical note for result.  Disposition   Pt is being discharged home today in good condition.  Follow-up Plans & Appointments     Follow-up Information     Tommie Raymond, NP. Go on 04/26/2021.   Specialty: Cardiology Why: at 3pm. Please arrive to your appointmen by 2:45pm Contact information: [place] [address] [phone]                  Discharge Medications   Allergies as of 04/19/2021       Reactions   Amitriptyline Other (See Comments)   Unknown reaction   Benadryl [diphenhydramine] Shortness Of Breath   Demerol [meperidine] Other (See Comments)   Unknown reaction   Gabapentin Other (See Comments)   Unknown reaction   Mirtazapine Other (See Comments)   Unknown reaction   Olanzapine Other (See Comments)   Unknown reaction   Voltaren [diclofenac Sodium] Shortness Of Breath   Zetia [  ezetimibe] Other (See Comments)   Weakness in legs, shakiness all over   Ativan [lorazepam] Other (See Comments)   Causes double vision at highter than .5 mg dose   Atorvastatin Other (See Comments)   Muscle aches and weakness   Budesonide-formoterol  Fumarate Other (See Comments)   Shakiness, tremors   Bupropion Hcl Other (See Comments)   "cloud over me" depression   Caffeine Other (See Comments)   jitters   Codeine Sulfate Other (See Comments)   Makes chest hurt like a heart attack   Lisinopril Cough   Metformin Nausea And Vomiting   Mometasone Furoate Nausea And Vomiting   Morphine Sulfate Other (See Comments)   Chest pain like a heart attack   Other Other (See Comments)   Beta Blockers, reaction shortness of breath   Oxycodone-acetaminophen Nausea And Vomiting   Pioglitazone Other (See Comments)   Cannot take because of risk of bladder cancer   Propoxyphene N-acetaminophen Nausea And Vomiting   Rosuvastatin Other (See Comments)   Muscle aches and weakness   Shellfish Allergy Diarrhea   Suvorexant Other (See Comments)   Jerking/nervous    Ticagrelor    Other reaction(s): Other (See Comments) "slowed heart rate" & chest pain   Tramadol Nausea Only   Venlafaxine Other (See Comments)   Unknown reaction   Xanax [alprazolam] Other (See Comments)   Mental status changes, "didn't know where she was at" per husband at bedside   Zolpidem Tartrate Other (See Comments)    Jittery, diarrhea   Latex Rash        Medication List     STOP taking these medications    diazepam 2 MG tablet Commonly known as: VALIUM   methylPREDNISolone 4 MG Tbpk tablet Commonly known as: MEDROL DOSEPAK       TAKE these medications    acetaminophen 500 MG tablet Commonly known as: TYLENOL Take 500-1,000 mg by mouth every 6 (six) hours as needed for mild pain.   albuterol (2.5 MG/3ML) 0.083% nebulizer solution Commonly known as: PROVENTIL Take 2.5 mg by nebulization every 6 (six) hours as needed for wheezing or shortness of breath.   Anoro Ellipta 62.5-25 MCG/ACT Aepb Generic drug: umeclidinium-vilanterol Inhale 1 puff into the lungs daily as needed (SOB).   aspirin 81 MG EC tablet Take 1 tablet (81 mg total) by mouth daily.  Swallow whole.   Basaglar KwikPen 100 UNIT/ML Inject 100 Units into the skin at bedtime.   budesonide 1 MG/2ML nebulizer solution Commonly known as: PULMICORT Inhale 2 mLs into the lungs at bedtime.   clopidogrel 75 MG tablet Commonly known as: PLAVIX Take 75 mg by mouth daily.   DULoxetine 20 MG capsule Commonly known as: CYMBALTA Take 1 capsule (20 mg total) by mouth in the morning.   FLUoxetine 40 MG capsule Commonly known as: PROZAC Take 40 mg by mouth 2 (two) times daily.   guaiFENesin 600 MG 12 hr tablet Commonly known as: MUCINEX Take 2 tablets (1,200 mg total) by mouth 2 (two) times daily.   insulin lispro 100 UNIT/ML injection Commonly known as: HUMALOG Inject 36-66 Units into the skin 3 (three) times daily before meals.   isosorbide mononitrate 30 MG 24 hr tablet Commonly known as: IMDUR Take 1 tablet (30 mg total) by mouth daily.   levothyroxine 125 MCG tablet Commonly known as: SYNTHROID Take 125 mcg by mouth daily before breakfast.   losartan 25 MG tablet Commonly known as: COZAAR Take 1 tablet (25 mg total) by mouth  in the morning.   nitroGLYCERIN 0.4 MG SL tablet Commonly known as: NITROSTAT Place 1 tablet (0.4 mg total) under the tongue every 5 (five) minutes as needed for chest pain.   ondansetron 4 MG disintegrating tablet Commonly known as: ZOFRAN-ODT Take 4 mg by mouth 3 (three) times daily as needed for nausea or vomiting.   pantoprazole 40 MG tablet Commonly known as: PROTONIX Take 40 mg by mouth in the morning.   torsemide 20 MG tablet Commonly known as: DEMADEX Take 20 mg by mouth daily.   traZODone 100 MG tablet Commonly known as: DESYREL Take 200 mg by mouth at bedtime.         Outstanding Labs/Studies   CBC, BMET  Duration of Discharge Encounter   Greater than 30 minutes including physician time.  Signed, Angelena Form, PA-C 04/19/2021, 11:28 AM [phone]  I have personally seen and examined this patient. I  agree with the assessment and plan as outlined above.  Doing well one day post TAVR. Groins stable. Chronic anemia with slight worsening as expected following her procedure. Will give one unit pRBCs today. No active bleeding. BP stable. Echo today. If ok, will d/c home today after pRBC transfusion and one dose Lasix.   [name] 04/19/2021 11:41 AM

## 2021-04-19 NOTE — Progress Notes (Signed)
Mobility Specialist: Progress Note   04/19/21 1138  Mobility  Activity Ambulated in hall  Level of Assistance Contact guard assist, steadying assist  Assistive Device Front wheel walker  Distance Ambulated (ft) 90 ft  Mobility Ambulated with assistance in hallway  Mobility Response Tolerated fair  Mobility performed by Mobility specialist  $Mobility charge 1 Mobility   Pre-Mobility on 1 L/min [place]: 79 HR, 151/64 BP, 96% SpO2 Post-Mobility on 2 L/min [place]: 77 HR, 142/58 BP, 95% SpO2  Pt ambulated on 2 L/min [place]. Pt very anxious but agreeable to mobility. Pt c/o pain in her L groin and difficulty breathing, coached through purse lipped breathing. Pt back to bed with call bell at her side and pt is set-up with her lunch.   [facility] Acea Yagi Mobility Specialist Mobility Specialist Phone: [phone]

## 2021-04-19 NOTE — Progress Notes (Signed)
  [facility]   MULTIDISCIPLINARY HEART VALVE TEAM   Husband called our RN Navigator stating that he will need for the patient to stay overnight in the hospital if she cannot be discharged by dark today due to driving issues. The patients blood transfusion was started at 13:15 and will stop at 17:15. She will then need a follow up H&H. Given this, we will keep the patient overnight with plans for early d/c in the AM.   Kathyrn Drown NP-C Structural Heart Team  Pager: [phone]

## 2021-04-20 LAB — BASIC METABOLIC PANEL
Anion gap: 8 (ref 5–15)
BUN: 17 mg/dL (ref 8–23)
CO2: 28 mmol/L (ref 22–32)
Calcium: 8.1 mg/dL — ABNORMAL LOW (ref 8.9–10.3)
Chloride: 96 mmol/L — ABNORMAL LOW (ref 98–111)
Creatinine, Ser: 1.04 mg/dL — ABNORMAL HIGH (ref 0.44–1.00)
GFR, Estimated: 56 mL/min — ABNORMAL LOW (ref >60)
Glucose, Bld: 384 mg/dL — ABNORMAL HIGH (ref 70–99)
Potassium: 4 mmol/L (ref 3.5–5.1)
Sodium: 132 mmol/L — ABNORMAL LOW (ref 135–145)

## 2021-04-20 LAB — CBC
HCT: 26.3 % — ABNORMAL LOW (ref 36.0–46.0)
Hemoglobin: 8.4 g/dL — ABNORMAL LOW (ref 12.0–15.0)
MCH: 27.4 pg (ref 26.0–34.0)
MCHC: 31.9 g/dL (ref 30.0–36.0)
MCV: 85.7 fL (ref 80.0–100.0)
Platelets: 165 10*3/uL (ref 150–400)
RBC: 3.07 MIL/uL — ABNORMAL LOW (ref 3.87–5.11)
RDW: 14.6 % (ref 11.5–15.5)
WBC: 9.2 10*3/uL (ref 4.0–10.5)
nRBC: 0 % (ref 0.0–0.2)

## 2021-04-20 LAB — BPAM RBC
Blood Product Expiration Date: 202212062359
ISSUE DATE / TIME: 202211161249
Unit Type and Rh: 7300

## 2021-04-20 LAB — TYPE AND SCREEN
ABO/RH(D): B POS
Antibody Screen: NEGATIVE
Unit division: 0

## 2021-04-20 MED FILL — Lidocaine HCl Local Preservative Free (PF) Inj 1%: INTRAMUSCULAR | Qty: 30 | Status: AC

## 2021-04-20 NOTE — Discharge Summary (Addendum)
[facility] TEAM  Discharge Summary    Patient ID: [name] MRN: [id number]; DOB: June 10, 1945  Admit date: 04/18/2021 Discharge date: 04/20/2021  Primary Care Provider: Rusty Aus, MD  Primary Cardiologist: Dr. Clayborn Bigness Jefm [name]) / Dr. Angelena [name] & Dr. 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   Dyslipidemia-statin ontol   Acute on chronic diastolic (congestive) heart failure (HCC)   Severe aortic stenosis   Allergies Allergies  Allergen Reactions   Amitriptyline Other (See Comments)    Unknown reaction   Benadryl [Diphenhydramine] Shortness Of Breath   Demerol [Meperidine] Other (See Comments)    Unknown reaction   Gabapentin Other (See Comments)    Unknown reaction   Mirtazapine Other (See Comments)    Unknown reaction   Olanzapine Other (See Comments)    Unknown reaction    Voltaren [Diclofenac Sodium] Shortness Of Breath   Zetia [Ezetimibe] Other (See Comments)    Weakness in legs, shakiness all over   Ativan [Lorazepam] Other (See Comments)    Causes double vision at highter than .5 mg dose   Atorvastatin Other (See Comments)    Muscle aches and weakness   Budesonide-Formoterol Fumarate Other (See Comments)    Shakiness, tremors   Bupropion Hcl Other (See Comments)    "cloud over me" depression   Caffeine Other (See Comments)    jitters   Codeine Sulfate Other (See Comments)    Makes chest hurt like a heart attack   Lisinopril Cough   Metformin Nausea And Vomiting   Mometasone Furoate Nausea And Vomiting   Morphine Sulfate Other (See Comments)    Chest pain like a heart attack    Other Other (See Comments)    Beta Blockers, reaction shortness of breath   Oxycodone-Acetaminophen Nausea And Vomiting   Pioglitazone Other (See Comments)    Cannot take because of risk of bladder cancer   Propoxyphene N-Acetaminophen Nausea And Vomiting   Rosuvastatin Other (See Comments)    Muscle aches and weakness   Shellfish Allergy Diarrhea   Suvorexant Other (See Comments)    Jerking/nervous    Ticagrelor     Other reaction(s): Other (See Comments) "slowed heart rate" & chest pain   Tramadol Nausea Only   Venlafaxine Other (See Comments)    Unknown reaction   Xanax [Alprazolam] Other (See Comments)    Mental status changes, "didn't know where she was at" per husband at bedside   Zolpidem Tartrate Other (See Comments)     Jittery, diarrhea   Latex Rash    Diagnostic Studies/Procedures    TAVR OPERATIVE NOTE     Date of Procedure:                04/18/2021   Preoperative Diagnosis:      Severe Aortic Stenosis    Postoperative Diagnosis:    Same    Procedure:        Transcatheter Aortic Valve in Valve Replacement - Percutaneous Right Transfemoral Approach             Gish Sapien 3 Ultra Resilia THV (size 23 mm, model #  9755RSL, serial # [id number])              Co-Surgeons:                        [name], MD and [name], MD     Anesthesiologist:                  Raechel Ache, MD   Echocardiographer:              Osborne Oman, MD   Pre-operative Echo Findings: Severe aortic stenosis Normal left ventricular systolic function   Post-operative Echo Findings: No paravalvular leak Normal left ventricular systolic function   _____________     Echo 04/18/2021 IMPRESSIONS   1. There is a 23 mm Mcelvain Sapien prosthetic (TAVR) valve present in the  aortic position. Effective orifice area is , by VTI measures 1.39 cm.  Aortic valve mean gradient measures 32.0 mmHg. Peak gradient is 55 mm Hg.  Aortic valve Vmax measures 3.60  m/s. Aortic  valve acceleration time measures 96 msec. DVI is 0.44. There  is trivial PVL.   2. There is turbulent flow in the LVOT with evidence of the torn chord  seen prior to TAVR implantation. Peak LVOT gradient is 8 mm Hg.   3. Left ventricular ejection fraction, by estimation, is 60 to 65%. The  left ventricle has normal function. The left ventricle has no regional  wall motion abnormalities. There is moderate concentric left ventricular  hypertrophy. Left ventricular  diastolic parameters are indeterminate.   4. Right ventricular systolic function is normal. The right ventricular  size is normal. There is mildly elevated pulmonary artery systolic  pressure. The estimated right ventricular systolic pressure is 62.8 mmHg.   5. Left atrial size was severely dilated.   6. The mitral valve is abnormal. Mild mitral valve regurgitation. Mild to  moderate mitral stenosis. The mean mitral valve gradient is 8.0 mmHg with  average heart rate of 75 bpm.   7. Tricuspid valve regurgitation is mild to moderate.   8. The inferior vena cava is dilated in size with >50% respiratory  variability, suggesting right atrial pressure of 8 mmHg.   Comparison(s): A prior study was performed on 04/18/21. Improvement in AV  gradients from pre-implantation.   History of Present Illness     [name] is a 75 y.o. 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  She was admitted to [facility] in 2017 with unstable angina and cardiac catheterization showed a patent LIMA to the LAD and patent saphenous vein graft to the RCA with progression of in-stent restenosis in the proximal left  circumflex that was treated with Cutting Balloon angioplasty.  She presented again in April 2019 feeling poorly with chest pressure, shortness of breath, fatigue, and dizziness.  Repeat catheterization showed patent bypass grafts and a patent left circumflex stent with mild restenosis.  Echo showed a normal functioning bioprosthetic aortic valve at that time.  She was admitted to [facility] with unstable angina in June 2022 and had stenting of the left circumflex and the saphenous vein graft  to the RCA.  Repeat catheterization on 02/10/2021 showed stable coronary disease with chronic total occlusion of the mid LAD and mid RCA with patent left circumflex stents with moderately severe distal AV groove stenosis with both bypass grafts patent. Echo on 02/13/2021 showed severe LVH.  Ejection fraction was 50 to 55%.  The bioprosthetic aortic valve was poorly visualized but appeared calcified and stenotic.  Her most recent echocardiogram on 03/01/2021 showed severe LVH with an ejection fraction of 60 to 65%.  The prosthetic aortic valve had severe stenosis with a mean gradient of 41 mmHg and a peak gradient of 70 mmHg.  She continued to report severe shortness of breath with any activity as well as at rest.  She is mostly sedentary at home because she cannot get up and walk without getting short of breath.    The patient has been evaluated by the multidisciplinary valve team and felt to have severe, symptomatic aortic stenosis and to be a suitable candidate for TAVR, which was set up for 04/18/2021.   Hospital Course     Consultants: none   Severe AS: s/p successful valve in valve TAVR with a 23 mm Margulies Sapien 3 Ultra Resilia THV via the TF approach on 04/18/21. Post operative echo showed EF 60%, normally functioning TAVR with a mean gradient of 32 mmHg and trivial PVL. There is turbulent flow in the LVOT with evidence of the torn chord seen prior to TAVR implantation. Peak LVOT gradient is 8 mm Hg.  Groin sites are  stable. ECG with sinus with 1st deg AV block (seen on previous tracings) and no high grade heart block. Continue Asprin and Plavix. She was supposed to be discharged yesterday but she had issues with transportation and stayed overnight. Plan for discharge home with close follow up in the office next week.   Acute on chronic diastolic CHF: as evidenced by an elevated BNP on pre admission lab work and cardiomegaly and interstitial prominence in the lungs on CXR. This has been treated with TAVR as well as one dose of IV lasix after blood transfusion. Resume home Torsemide. BMET next week.   Acute on chronic anemia: hg 9.1--> 7.1--> 8.4 after transfusion. Groin sites look good. Likely 2/2 hemodilution and surgery. Given 1U PRBCs. Recheck CBC at follow up.    CAD: underwent PCI to LCx and SVG to RCA in 11/2020 in the setting of unstable angina. Cath 02/2021 showed severe native three-vessel coronary artery disease including CTO of the mid LAD, CTO of the mid RCA, and patent stent in the mid left circumflex, patent SVG to RCA with 2 patent stents in the proximal and distal segments, patent LIMA to LAD. Continue DAPT for at least 1 year.    HTN: resumed on home meds   DMT2: treated with SSI while admitted. Resume home meds at discharge.   CKD stage IIIa: creat remained stable ~1-1.2. _____________  Discharge Vitals Blood pressure (!) 123/57, pulse 77, temperature 98 F (36.7 C), temperature source Oral, resp. rate 16, height 5\' 6"  (1.676 m), weight 98.9 kg, SpO2 98 %.  Filed Weights   04/18/21 0718 04/19/21 0254 04/20/21 0317  Weight: 95.3 kg 97.5 kg 98.9 kg     GEN: Well nourished, well developed, appears anxious HEENT: normal Neck: no JVD or masses Cardiac: RRR; + 3/6 SEM. No rubs, or gallops,no edema  Respiratory:  clear to auscultation bilaterally, normal work of breathing GI: soft, nontender, nondistended, + BS MS: no deformity or atrophy Skin: warm  and dry, no rash.  Groin sites clear  without hematoma or ecchymosis  Neuro:  Alert and Oriented x 3, Strength and sensation are intact Psych: euthymic mood, full affect   Labs & Radiologic Studies    CBC Recent Labs    04/19/21 0130 04/19/21 1808 04/20/21 0105  WBC 11.9*  --  9.2  HGB 7.1* 7.4* 8.4*  HCT 23.1* 24.3* 26.3*  MCV 85.2  --  85.7  PLT 196  --  673   Basic Metabolic Panel Recent Labs    04/19/21 0130 04/20/21 0105  NA 129* 132*  K 5.1 4.0  CL 95* 96*  CO2 26 28  GLUCOSE 381* 384*  BUN 19 17  CREATININE 1.21* 1.04*  CALCIUM 8.1* 8.1*  MG 1.9  --    Liver Function Tests No results for input(s): AST, ALT, ALKPHOS, BILITOT, PROT, ALBUMIN in the last 72 hours. No results for input(s): LIPASE, AMYLASE in the last 72 hours. Cardiac Enzymes No results for input(s): CKTOTAL, CKMB, CKMBINDEX, TROPONINI in the last 72 hours. BNP Invalid input(s): POCBNP D-Dimer No results for input(s): DDIMER in the last 72 hours. Hemoglobin A1C No results for input(s): HGBA1C in the last 72 hours. Fasting Lipid Panel No results for input(s): CHOL, HDL, LDLCALC, TRIG, CHOLHDL, LDLDIRECT in the last 72 hours. Thyroid Function Tests No results for input(s): TSH, T4TOTAL, T3FREE, THYROIDAB in the last 72 hours.  Invalid input(s): FREET3 _____________  DG Chest 2 View  Result Date: 04/17/2021 CLINICAL DATA:  Preoperative assessment. EXAM: CHEST - 2 VIEW COMPARISON:  04/06/2021. FINDINGS: The heart is enlarged and the mediastinal structures are stable. Sternotomy wires are noted over the midline and there is a prosthetic cardiac valve. A cardiac stent is noted. Mild atherosclerotic calcification of the aorta is present. Interstitial prominence is noted in the lungs bilaterally, unchanged from the prior exam. No consolidation, effusion, or pneumothorax. Surgical changes are noted at the upper abdomen. No acute osseous abnormality. IMPRESSION: 1. No acute process. 2. Stable cardiomegaly and interstitial prominence in the  lungs. . Electronically Signed   By: Brett Fairy M.D.   On: 04/17/2021 04:00   DG Chest 2 View  Result Date: 04/06/2021 CLINICAL DATA:  75 year old female with recurrent tachycardia. EXAM: CHEST - 2 VIEW COMPARISON:  Chest radiographs 04/03/2021 and earlier. FINDINGS: Stable cardiomegaly. Prior CABG. Stable tortuous thoracic aorta. Prior cardiac valve replacement. Visualized tracheal air column is within normal limits. Lower lung volumes. Chronic increased pulmonary interstitial markings, not significantly changed since 2019. No pneumothorax, pleural effusion or acute pulmonary opacity. No acute osseous abnormality identified. Stable bowel gas pattern and visible surgical clips in the upper abdomen. IMPRESSION: Cardiomegaly and chronic lung disease. No acute cardiopulmonary abnormality. Electronically Signed   By: Genevie Ann M.D.   On: 04/06/2021 10:47   DG Chest 2 View  Result Date: 04/03/2021 CLINICAL DATA:  Chest pain, dyspnea EXAM: CHEST - 2 VIEW COMPARISON:  04/01/2021 FINDINGS: Stable pulmonary insufflation. Trace interstitial pulmonary infiltrate has slightly improved in the interval since the prior examination, though has not yet completely resolved most in keeping with mild diffuse interstitial pulmonary edema. No pneumothorax or pleural effusion. Cardiomegaly is stable. Coronary artery bypass grafting has been performed. No acute bone abnormality. IMPRESSION: Improved, trace interstitial pulmonary edema. Electronically Signed   By: Fidela Salisbury M.D.   On: 04/03/2021 02:49   DG Chest 2 View  Result Date: 04/01/2021 CLINICAL DATA:  Dyspnea EXAM: CHEST - 2 VIEW COMPARISON:  03/21/2021 FINDINGS: The  lungs are symmetrically expanded. No confluent pulmonary infiltrate. Fine interstitial infiltrate is seen at the lung bases bilaterally which may represent mild interstitial pulmonary edema. No pneumothorax or pleural effusion. Cardiomegaly is stable. Coronary artery bypass grafting and aortic valve  replacement has been performed. IMPRESSION: Stable cardiomegaly. Interval development of mild interstitial pulmonary edema. Electronically Signed   By: Fidela Salisbury M.D.   On: 04/01/2021 02:15   DG Chest 2 View  Result Date: 03/21/2021 CLINICAL DATA:  chest pain EXAM: CHEST - 2 VIEW COMPARISON:  March 07, 2021 FINDINGS: Evaluation is limited secondary to patient rotation. The cardiomediastinal silhouette is unchanged and enlarged in contour.Status post median sternotomy with aortic valve replacement and CABG. Atherosclerotic calcifications. No pleural effusion. No pneumothorax. Diffuse interstitial prominence. Surgical clips project over the upper abdomen. IMPRESSION: Constellation of findings are favored to reflect pulmonary edema. Differential considerations include atypical infection. Electronically Signed   By: Valentino Saxon M.D.   On: 03/21/2021 13:55   ECHOCARDIOGRAM COMPLETE  Result Date: 04/19/2021    ECHOCARDIOGRAM REPORT   Patient Name:   [name] Date of Exam: 04/19/2021 Medical Rec #:  [id number]     Height:       66.0 in Accession #:    [id number]    Weight:       214.9 lb Date of Birth:  04/27/1946      BSA:          2.062 m Patient Age:    56 years      BP:           145/51 mmHg Patient Gender: F             HR:           76 bpm. Exam Location:  Inpatient Procedure: 2D Echo, Cardiac Doppler and Color Doppler Indications:    S/p TAVR  History:        Patient has prior history of Echocardiogram examinations. Risk                 Factors:Hypertension, Diabetes, Dyslipidemia and Sleep Apnea.                 Aortic Valve: 23 mm Lutter Sapien prosthetic, stented (TAVR)                 valve is present in the aortic position.  Sonographer:    Jyl Heinz Referring Phys: [id number] [place]  1. There is a 23 mm Tremper Sapien prosthetic (TAVR) valve present in the aortic position. Effective orifice area is , by VTI measures 1.39 cm. Aortic valve mean gradient  measures 32.0 mmHg. Peak gradient is 55 mm Hg. Aortic valve Vmax measures 3.60 m/s. Aortic valve acceleration time measures 96 msec. DVI is 0.44. There is trivial PVL.  2. There is turbulent flow in the LVOT with evidence of the torn chord seen prior to TAVR implantation. Peak LVOT gradient is 8 mm Hg.  3. Left ventricular ejection fraction, by estimation, is 60 to 65%. The left ventricle has normal function. The left ventricle has no regional wall motion abnormalities. There is moderate concentric left ventricular hypertrophy. Left ventricular diastolic parameters are indeterminate.  4. Right ventricular systolic function is normal. The right ventricular size is normal. There is mildly elevated pulmonary artery systolic pressure. The estimated right ventricular systolic pressure is 24.4 mmHg.  5. Left atrial size was severely dilated.  6. The mitral valve is abnormal. Mild mitral valve regurgitation.  Mild to moderate mitral stenosis. The mean mitral valve gradient is 8.0 mmHg with average heart rate of 75 bpm.  7. Tricuspid valve regurgitation is mild to moderate.  8. The inferior vena cava is dilated in size with >50% respiratory variability, suggesting right atrial pressure of 8 mmHg. Comparison(s): A prior study was performed on 04/18/21. Improvement in AV gradients from pre-implantation. FINDINGS  Left Ventricle: Left ventricular ejection fraction, by estimation, is 60 to 65%. The left ventricle has normal function. The left ventricle has no regional wall motion abnormalities. The left ventricular internal cavity size was normal in size. There is  moderate concentric left ventricular hypertrophy. Left ventricular diastolic parameters are indeterminate. Right Ventricle: The right ventricular size is normal. No increase in right ventricular wall thickness. Right ventricular systolic function is normal. There is mildly elevated pulmonary artery systolic pressure. The tricuspid regurgitant velocity is 3.03  m/s,  and with an assumed right atrial pressure of 8 mmHg, the estimated right ventricular systolic pressure is 75.6 mmHg. Left Atrium: Left atrial size was severely dilated. Right Atrium: Right atrial size was normal in size. Pericardium: There is no evidence of pericardial effusion. Mitral Valve: The mitral valve is abnormal. Mild mitral valve regurgitation. Mild to moderate mitral valve stenosis. MV peak gradient, 20.0 mmHg. The mean mitral valve gradient is 8.0 mmHg with average heart rate of 75 bpm. Tricuspid Valve: The tricuspid valve is normal in structure. Tricuspid valve regurgitation is mild to moderate. Aortic Valve: The aortic valve has been repaired/replaced. Aortic valve regurgitation is trivial. Aortic valve mean gradient measures 32.0 mmHg. Aortic valve peak gradient measures 51.7 mmHg. Aortic valve area, by VTI measures 1.39 cm. There is a 23 mm Lafontaine Sapien prosthetic, stented (TAVR) valve present in the aortic position. Pulmonic Valve: The pulmonic valve was not well visualized. Pulmonic valve regurgitation is mild to moderate. No evidence of pulmonic stenosis. Aorta: The aortic root and ascending aorta are structurally normal, with no evidence of dilitation. Venous: The inferior vena cava is dilated in size with greater than 50% respiratory variability, suggesting right atrial pressure of 8 mmHg. IAS/Shunts: The atrial septum is grossly normal.  LEFT VENTRICLE PLAX 2D LVIDd:         4.40 cm      Diastology LVIDs:         3.00 cm      LV e' medial:    4.24 cm/s LV PW:         1.20 cm      LV E/e' medial:  44.3 LV IVS:        1.40 cm      LV e' lateral:   5.00 cm/s LVOT diam:     2.00 cm      LV E/e' lateral: 37.6 LV SV:         106 LV SV Index:   51 LVOT Area:     3.14 cm  LV Volumes (MOD) LV vol d, MOD A2C: 148.0 ml LV vol d, MOD A4C: 113.0 ml LV vol s, MOD A2C: 59.3 ml LV vol s, MOD A4C: 48.5 ml LV SV MOD A2C:     88.7 ml LV SV MOD A4C:     113.0 ml LV SV MOD BP:      75.2 ml RIGHT VENTRICLE             IVC RV Basal diam:  4.20 cm    IVC diam: 2.10 cm RV Mid diam:    4.10 cm  RV S prime:     9.14 cm/s TAPSE (M-mode): 1.8 cm LEFT ATRIUM              Index        RIGHT ATRIUM           Index LA diam:        3.80 cm  1.84 cm/m   RA Area:     19.50 cm LA Vol (A2C):   86.2 ml  41.80 ml/m  RA Volume:   60.70 ml  29.44 ml/m LA Vol (A4C):   104.0 ml 50.44 ml/m LA Biplane Vol: 98.2 ml  47.62 ml/m  AORTIC VALVE AV Area (Vmax):    1.17 cm AV Area (Vmean):   1.23 cm AV Area (VTI):     1.39 cm AV Vmax:           359.50 cm/s AV Vmean:          274.000 cm/s AV VTI:            0.763 m AV Peak Grad:      51.7 mmHg AV Mean Grad:      32.0 mmHg LVOT Vmax:         134.00 cm/s LVOT Vmean:        107.000 cm/s LVOT VTI:          0.338 m LVOT/AV VTI ratio: 0.44  AORTA Ao Asc diam: 3.70 cm MITRAL VALVE                TRICUSPID VALVE MV Area (PHT): 2.96 cm     TR Peak grad:   36.7 mmHg MV Area VTI:   1.74 cm     TR Vmax:        303.00 cm/s MV Peak grad:  20.0 mmHg MV Mean grad:  8.0 mmHg     SHUNTS MV Vmax:       2.24 m/s     Systemic VTI:  0.34 m MV Vmean:      131.5 cm/s   Systemic Diam: 2.00 cm MV Decel Time: 256 msec MV E velocity: 188.00 cm/s MV A velocity: 145.00 cm/s MV E/A ratio:  1.30 Rudean Haskell MD Electronically signed by Rudean Haskell MD Signature Date/Time: 04/19/2021/12:16:38 PM    Final    ECHO TEE  Result Date: 04/18/2021    TRANSESOPHOGEAL ECHO REPORT   Patient Name:   [name] Date of Exam: 04/18/2021 Medical Rec #:  [id number]     Height:       66.0 in Accession #:    [id number]    Weight:       210.0 lb Date of Birth:  11-12-1945      BSA:          2.042 m Patient Age:    56 years      BP:           111/62 mmHg Patient Gender: F             HR:           81 bpm. Exam Location:  Inpatient Procedure: Transesophageal Echo, Color Doppler, Cardiac Doppler and 3D Echo Indications:     Aortic Stenosis  History          Patient has prior history of Echocardiogram examinations, most                   recent 03/01/2021. CAD, PAD, Carotid Disease and COPD, Aortic  Valve Disease, Arrythmias:1st degree AV block; Risk                  Factors:Hypertension, Dyslipidemia, Former Smoker, Sleep Apnea                  and Diabetes.                  Aortic Valve: Valve in Valve TAVR. VALVE MAGNA EASE 21MM.                  Procedure Date 10/12/2013.                  23 mm Sapien prosthetic, stented (TAVR) valve is present inside                  the aortic bioprosthetic                   Procedure Date: 04/18/2021.  Sonographer:     Darlina Sicilian RDCS Referring Phys:  [place] Diagnosing Phys: Rudean Haskell MD PROCEDURE: After discussion of the risks and benefits of a TEE, an informed consent was obtained from the patient. The patient was intubated. The transesophogeal probe was passed without difficulty through the esophogus of the patient. Sedation performed  by different physician. The patient was monitored while under deep sedation. Anesthestetic sedation was provided intravenously by Anesthesiology: 111.98mg  of Propofol. Image quality was good. The patient's vital signs; including heart rate, blood pressure, and oxygen saturation; remained stable throughout the procedure. The patient developed no complications during the procedure. IMPRESSIONS  1. Prior to procedure there was a 21 mm Manga-Ease Valve present in the Aortic Position. There was mild to moderate central aortic regurgitation likely related to damaged prosthetic valve. There was severe aortic stenosis with mean gradient of 51 mmHg, peak gradient of 78 mmHg, DVI 0.31, EOA 0.98 cm2.  2. Post procedure there isa a 23 mm Sapien prosthetic (TAVR) valve present in the aortic position. The prosthetic torn chord from prior to TAVR is still visible in the LVOT and was presentl prior to implantation. There is trivial PVL but mild central AI. Post procedural mean gradients: mean gradient 22 mmHg, peak gradient 36 mmHg,  DVI 0.57, EOA 1.79 cm2. Improvement in both aortic regurgitation and stenosis.  3. There is Severe, mobile (Grade V) plaque involving the descending aorta, aortic arch and ascending aorta.  4. Left ventricular ejection fraction, by estimation, is 60 to 65%. The left ventricle has normal function. There is severe concentric left ventricular hypertrophy.  5. Right ventricular systolic function is normal. The right ventricular size is normal.  6. The mitral valve is abnormal. Mild mitral valve regurgitation. Mild mitral stenosis. The mean mitral valve gradient is 4.0 mmHg.  7. Left atrial size was moderately dilated. No left atrial/left atrial appendage thrombus was detected. FINDINGS  Left Ventricle: Left ventricular ejection fraction, by estimation, is 60 to 65%. The left ventricle has normal function. The left ventricular internal cavity size was normal in size. There is severe concentric left ventricular hypertrophy. Right Ventricle: The right ventricular size is normal. Right vetricular wall thickness was not assessed. Right ventricular systolic function is normal. Left Atrium: Left atrial size was moderately dilated. No left atrial/left atrial appendage thrombus was detected. Right Atrium: Right atrial size was normal in size. Pericardium: Trivial pericardial effusion is present. Mitral Valve: The mitral valve is abnormal. Mild mitral valve regurgitation. Mild mitral valve stenosis. MV peak gradient,  7.5 mmHg. The mean mitral valve gradient is 4.0 mmHg. Tricuspid Valve: The tricuspid valve is normal in structure. Tricuspid valve regurgitation is mild. Aortic Valve: The aortic valve has been repaired/replaced. Aortic valve regurgitation is mild. Aortic valve mean gradient measures 20.2 mmHg. Aortic valve peak gradient measures 34.9 mmHg. Aortic valve area, by VTI measures 1.92 cm. 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 Procedure Date: 04/18/2021. Pulmonic Valve:  The pulmonic valve was not well visualized. Pulmonic valve regurgitation is mild. Aorta: The aortic root is normal in size and structure. 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 IAS/Shunts: The interatrial septum appears to be lipomatous. The atrial septum is grossly normal.  LEFT VENTRICLE PLAX 2D LVOT diam:     2.00 cm LV SV:         118 LV SV Index:   58 LVOT Area:     3.14 cm  AORTIC VALVE AV Area (Vmax):    1.97 cm AV Area (Vmean):   1.62 cm AV Area (VTI):     1.92 cm AV Vmax:           295.25 cm/s AV Vmean:          208.000 cm/s AV VTI:            0.618 m AV Peak Grad:      34.9 mmHg AV Mean Grad:      20.2 mmHg LVOT Vmax:         185.00 cm/s LVOT Vmean:        107.000 cm/s LVOT VTI:          0.377 m LVOT/AV VTI ratio: 0.61 MITRAL VALVE MV Peak grad: 7.5 mmHg  SHUNTS MV Mean grad: 4.0 mmHg  Systemic VTI:  0.38 m MV Vmax:      1.37 m/s  Systemic Diam: 2.00 cm MV Vmean:     95.3 cm/s Rudean Haskell MD Electronically signed by Rudean Haskell MD Signature Date/Time: 04/18/2021/6:49:55 PM    Final    Structural Heart Procedure  Result Date: 04/18/2021 See surgical note for result.  Disposition   Pt is being discharged home today in good condition.  Follow-up Plans & Appointments     Follow-up Information     Tommie Raymond, NP. Go on 04/26/2021.   Specialty: Cardiology Why: at 3pm. Please arrive to your appointmen by 2:45pm Contact information: [place] [address] [phone]                  Discharge Medications   Allergies as of 04/20/2021       Reactions   Amitriptyline Other (See Comments)   Unknown reaction   Benadryl [diphenhydramine] Shortness Of Breath   Demerol [meperidine] Other (See Comments)   Unknown reaction   Gabapentin Other (See Comments)   Unknown reaction   Mirtazapine Other (See Comments)   Unknown reaction   Olanzapine Other (See  Comments)   Unknown reaction   Voltaren [diclofenac Sodium] Shortness Of Breath   Zetia [ezetimibe] Other (See Comments)   Weakness in legs, shakiness all over   Ativan [lorazepam] Other (See Comments)   Causes double vision at highter than .5 mg dose   Atorvastatin Other (See Comments)   Muscle aches and weakness   Budesonide-formoterol Fumarate Other (See Comments)   Shakiness, tremors   Bupropion Hcl Other (See Comments)   "cloud over  me" depression   Caffeine Other (See Comments)   jitters   Codeine Sulfate Other (See Comments)   Makes chest hurt like a heart attack   Lisinopril Cough   Metformin Nausea And Vomiting   Mometasone Furoate Nausea And Vomiting   Morphine Sulfate Other (See Comments)   Chest pain like a heart attack   Other Other (See Comments)   Beta Blockers, reaction shortness of breath   Oxycodone-acetaminophen Nausea And Vomiting   Pioglitazone Other (See Comments)   Cannot take because of risk of bladder cancer   Propoxyphene N-acetaminophen Nausea And Vomiting   Rosuvastatin Other (See Comments)   Muscle aches and weakness   Shellfish Allergy Diarrhea   Suvorexant Other (See Comments)   Jerking/nervous    Ticagrelor    Other reaction(s): Other (See Comments) "slowed heart rate" & chest pain   Tramadol Nausea Only   Venlafaxine Other (See Comments)   Unknown reaction   Xanax [alprazolam] Other (See Comments)   Mental status changes, "didn't know where she was at" per husband at bedside   Zolpidem Tartrate Other (See Comments)    Jittery, diarrhea   Latex Rash        Medication List     STOP taking these medications    diazepam 2 MG tablet Commonly known as: VALIUM   methylPREDNISolone 4 MG Tbpk tablet Commonly known as: MEDROL DOSEPAK       TAKE these medications    acetaminophen 500 MG tablet Commonly known as: TYLENOL Take 500-1,000 mg by mouth every 6 (six) hours as needed for mild pain.   albuterol (2.5 MG/3ML) 0.083%  nebulizer solution Commonly known as: PROVENTIL Take 2.5 mg by nebulization every 6 (six) hours as needed for wheezing or shortness of breath.   Anoro Ellipta 62.5-25 MCG/ACT Aepb Generic drug: umeclidinium-vilanterol Inhale 1 puff into the lungs daily as needed (SOB).   aspirin 81 MG EC tablet Take 1 tablet (81 mg total) by mouth daily. Swallow whole.   Basaglar KwikPen 100 UNIT/ML Inject 100 Units into the skin at bedtime.   budesonide 1 MG/2ML nebulizer solution Commonly known as: PULMICORT Inhale 2 mLs into the lungs at bedtime.   clopidogrel 75 MG tablet Commonly known as: PLAVIX Take 75 mg by mouth daily.   DULoxetine 20 MG capsule Commonly known as: CYMBALTA Take 1 capsule (20 mg total) by mouth in the morning.   FLUoxetine 40 MG capsule Commonly known as: PROZAC Take 40 mg by mouth 2 (two) times daily.   guaiFENesin 600 MG 12 hr tablet Commonly known as: MUCINEX Take 2 tablets (1,200 mg total) by mouth 2 (two) times daily.   insulin lispro 100 UNIT/ML injection Commonly known as: HUMALOG Inject 36-66 Units into the skin 3 (three) times daily before meals.   isosorbide mononitrate 30 MG 24 hr tablet Commonly known as: IMDUR Take 1 tablet (30 mg total) by mouth daily.   levothyroxine 125 MCG tablet Commonly known as: SYNTHROID Take 125 mcg by mouth daily before breakfast.   losartan 25 MG tablet Commonly known as: COZAAR Take 1 tablet (25 mg total) by mouth in the morning.   nitroGLYCERIN 0.4 MG SL tablet Commonly known as: NITROSTAT Place 1 tablet (0.4 mg total) under the tongue every 5 (five) minutes as needed for chest pain.   ondansetron 4 MG disintegrating tablet Commonly known as: ZOFRAN-ODT Take 4 mg by mouth 3 (three) times daily as needed for nausea or vomiting.   pantoprazole 40 MG tablet Commonly  known as: PROTONIX Take 40 mg by mouth in the morning.   torsemide 20 MG tablet Commonly known as: DEMADEX Take 20 mg by mouth daily.    traZODone 100 MG tablet Commonly known as: DESYREL Take 200 mg by mouth at bedtime.         Outstanding Labs/Studies   none  Duration of Discharge Encounter   Greater than 30 minutes including physician time.  Signed, Megan Form, PA-C 04/20/2021, 9:01 AM [id number]  I have personally seen and examined this patient. I agree with the assessment and plan as outlined above.  Pt doing well post blood transfusion. Groins stable. Two days post TAVR. BP stable. Tele reviewed. Discharge home today.   [name] 04/20/2021 9:59 AM

## 2021-04-20 NOTE — Progress Notes (Signed)
Patient given discharge instructions. Husband present. PIV removed. Telemetry box removed, CCMD notified. Patient taken to vehicle in wheelchair by staff.  Daymon Larsen, RN

## 2021-04-20 NOTE — Progress Notes (Signed)
CARDIAC REHAB PHASE I   Pt actively getting ready to discharge. Reinforced d/c education with pt and family. Pt and family deny questions or concerns at this time.  [phone] Rufina Falco, RN BSN 04/20/2021 10:43 AM

## 2021-04-21 ENCOUNTER — Telehealth: Payer: Self-pay | Admitting: Cardiology

## 2021-04-21 NOTE — Telephone Encounter (Signed)
  [facility] VALVE TEAM   Patient contacted regarding discharge from [facility] on 04/20/21.  Patient understands to follow up with a Kathyrn Drown, NP-C on 04/26/21 at [place].  Patient understands discharge instructions? yes Patient understands medications and regimen? yes Patient understands to bring all medications to this visit? yes  Kathyrn Drown NP-C Structural Heart Team  Pager: [phone]

## 2021-04-26 ENCOUNTER — Other Ambulatory Visit: Payer: Self-pay

## 2021-04-26 ENCOUNTER — Encounter: Payer: Self-pay | Admitting: Cardiology

## 2021-04-26 ENCOUNTER — Ambulatory Visit (INDEPENDENT_AMBULATORY_CARE_PROVIDER_SITE_OTHER): Payer: Medicare HMO | Admitting: Cardiology

## 2021-04-26 VITALS — BP 130/58 | HR 80 | Ht 65.0 in | Wt 215.0 lb

## 2021-04-26 DIAGNOSIS — I350 Nonrheumatic aortic (valve) stenosis: Secondary | ICD-10-CM

## 2021-04-26 DIAGNOSIS — Z952 Presence of prosthetic heart valve: Secondary | ICD-10-CM | POA: Diagnosis not present

## 2021-04-26 DIAGNOSIS — F419 Anxiety disorder, unspecified: Secondary | ICD-10-CM

## 2021-04-26 DIAGNOSIS — N1831 Chronic kidney disease, stage 3a: Secondary | ICD-10-CM

## 2021-04-26 DIAGNOSIS — R0600 Dyspnea, unspecified: Secondary | ICD-10-CM | POA: Diagnosis not present

## 2021-04-26 DIAGNOSIS — Z23 Encounter for immunization: Secondary | ICD-10-CM

## 2021-04-26 DIAGNOSIS — D649 Anemia, unspecified: Secondary | ICD-10-CM

## 2021-04-26 DIAGNOSIS — I2510 Atherosclerotic heart disease of native coronary artery without angina pectoris: Secondary | ICD-10-CM

## 2021-04-26 NOTE — Patient Instructions (Signed)
Medication Instructions:  Your physician recommends that you continue on your current medications as directed. Please refer to the Current Medication list given to you today.  *If you need a refill on your cardiac medications before your next appointment, please call your pharmacy*   Lab Work: CBC and BMET If you have labs (blood work) drawn today and your tests are completely normal, you will receive your results only by: [facility] (if you have MyChart) OR A paper copy in the mail If you have any lab test that is abnormal or we need to change your treatment, we will call you to review the results.   Testing/Procedures: None ordered.    Follow-Up: At [facility], you and your health needs are our priority.  As part of our continuing mission to provide you with exceptional heart care, we have created designated Provider Care Teams.  These Care Teams include your primary Cardiologist (physician) and Advanced Practice Providers (APPs -  Physician Assistants and Nurse Practitioners) who all work together to provide you with the care you need, when you need it.  We recommend signing up for the patient portal called "MyChart".  Sign up information is provided on this After Visit Summary.  MyChart is used to connect with patients for Virtual Visits (Telemedicine).  Patients are able to view lab/test results, encounter notes, upcoming appointments, etc.  Non-urgent messages can be sent to your provider as well.   To learn more about what you can do with MyChart, go to [email or web address].    Your next appointment:   As scheduled

## 2021-04-27 LAB — BASIC METABOLIC PANEL
BUN/Creatinine Ratio: 12 (ref 12–28)
BUN: 13 mg/dL (ref 8–27)
CO2: 27 mmol/L (ref 20–29)
Calcium: 8 mg/dL — ABNORMAL LOW (ref 8.7–10.3)
Chloride: 97 mmol/L (ref 96–106)
Creatinine, Ser: 1.13 mg/dL — ABNORMAL HIGH (ref 0.57–1.00)
Glucose: 211 mg/dL — ABNORMAL HIGH (ref 70–99)
Potassium: 4.4 mmol/L (ref 3.5–5.2)
Sodium: 139 mmol/L (ref 134–144)
eGFR: 50 mL/min/{1.73_m2} — ABNORMAL LOW (ref >59)

## 2021-04-27 LAB — CBC
Hematocrit: 25.7 % — ABNORMAL LOW (ref 34.0–46.6)
Hemoglobin: 8.2 g/dL — ABNORMAL LOW (ref 11.1–15.9)
MCH: 26.2 pg — ABNORMAL LOW (ref 26.6–33.0)
MCHC: 31.9 g/dL (ref 31.5–35.7)
MCV: 82 fL (ref 79–97)
Platelets: 177 10*3/uL (ref 150–450)
RBC: 3.13 x10E6/uL — ABNORMAL LOW (ref 3.77–5.28)
RDW: 14.5 % (ref 11.7–15.4)
WBC: 10 10*3/uL (ref 3.4–10.8)

## 2021-04-30 ENCOUNTER — Emergency Department: Payer: Medicare HMO

## 2021-04-30 ENCOUNTER — Encounter: Payer: Self-pay | Admitting: Radiology

## 2021-04-30 ENCOUNTER — Other Ambulatory Visit: Payer: Self-pay

## 2021-04-30 DIAGNOSIS — R112 Nausea with vomiting, unspecified: Secondary | ICD-10-CM | POA: Diagnosis not present

## 2021-04-30 DIAGNOSIS — R0602 Shortness of breath: Secondary | ICD-10-CM | POA: Diagnosis present

## 2021-04-30 DIAGNOSIS — Z5321 Procedure and treatment not carried out due to patient leaving prior to being seen by health care provider: Secondary | ICD-10-CM | POA: Insufficient documentation

## 2021-04-30 LAB — CBC
HCT: 25.8 % — ABNORMAL LOW (ref 36.0–46.0)
Hemoglobin: 8.2 g/dL — ABNORMAL LOW (ref 12.0–15.0)
MCH: 26.5 pg (ref 26.0–34.0)
MCHC: 31.8 g/dL (ref 30.0–36.0)
MCV: 83.5 fL (ref 80.0–100.0)
Platelets: 222 10*3/uL (ref 150–400)
RBC: 3.09 MIL/uL — ABNORMAL LOW (ref 3.87–5.11)
RDW: 14.5 % (ref 11.5–15.5)
WBC: 10.3 10*3/uL (ref 4.0–10.5)
nRBC: 0 % (ref 0.0–0.2)

## 2021-04-30 LAB — BASIC METABOLIC PANEL
Anion gap: 10 (ref 5–15)
BUN: 18 mg/dL (ref 8–23)
CO2: 27 mmol/L (ref 22–32)
Calcium: 8.1 mg/dL — ABNORMAL LOW (ref 8.9–10.3)
Chloride: 96 mmol/L — ABNORMAL LOW (ref 98–111)
Creatinine, Ser: 1.48 mg/dL — ABNORMAL HIGH (ref 0.44–1.00)
GFR, Estimated: 36 mL/min — ABNORMAL LOW (ref >60)
Glucose, Bld: 164 mg/dL — ABNORMAL HIGH (ref 70–99)
Potassium: 4.5 mmol/L (ref 3.5–5.1)
Sodium: 133 mmol/L — ABNORMAL LOW (ref 135–145)

## 2021-04-30 MED ORDER — ONDANSETRON 4 MG PO TBDP
ORAL_TABLET | ORAL | Status: AC
  Filled 2021-04-30: qty 1

## 2021-04-30 NOTE — ED Triage Notes (Signed)
Pt c/o SOB progressively worse over the last couple weeks. Pt endorses n/v. Pt very anxious in triage and yelling at staff.

## 2021-05-01 ENCOUNTER — Emergency Department
Admission: EM | Admit: 2021-05-01 | Discharge: 2021-05-01 | Disposition: A | Discharge status: Home / Self Care | Payer: Medicare HMO | Attending: Emergency Medicine | Admitting: Emergency Medicine

## 2021-05-01 LAB — TROPONIN I (HIGH SENSITIVITY)
Troponin I (High Sensitivity): 20 ng/L — ABNORMAL HIGH (ref <18)
Troponin I (High Sensitivity): 24 ng/L — ABNORMAL HIGH (ref <18)

## 2021-05-17 ENCOUNTER — Ambulatory Visit (HOSPITAL_COMMUNITY): Payer: Medicare HMO | Attending: Cardiology

## 2021-05-17 ENCOUNTER — Ambulatory Visit (INDEPENDENT_AMBULATORY_CARE_PROVIDER_SITE_OTHER): Payer: Medicare HMO | Admitting: Physician Assistant

## 2021-05-17 ENCOUNTER — Encounter: Payer: Self-pay | Admitting: Physician Assistant

## 2021-05-17 ENCOUNTER — Other Ambulatory Visit: Payer: Self-pay

## 2021-05-17 VITALS — BP 140/80 | HR 85 | Ht 66.0 in | Wt 208.8 lb

## 2021-05-17 DIAGNOSIS — Z952 Presence of prosthetic heart valve: Secondary | ICD-10-CM

## 2021-05-17 DIAGNOSIS — I2510 Atherosclerotic heart disease of native coronary artery without angina pectoris: Secondary | ICD-10-CM

## 2021-05-17 DIAGNOSIS — I10 Essential (primary) hypertension: Secondary | ICD-10-CM

## 2021-05-17 DIAGNOSIS — F419 Anxiety disorder, unspecified: Secondary | ICD-10-CM

## 2021-05-17 DIAGNOSIS — I5032 Chronic diastolic (congestive) heart failure: Secondary | ICD-10-CM

## 2021-05-17 DIAGNOSIS — N1831 Chronic kidney disease, stage 3a: Secondary | ICD-10-CM

## 2021-05-17 DIAGNOSIS — D649 Anemia, unspecified: Secondary | ICD-10-CM

## 2021-05-17 LAB — ECHOCARDIOGRAM COMPLETE
AR max vel: 1.02 cm2
AV Area VTI: 1.02 cm2
AV Area mean vel: 0.95 cm2
AV Mean grad: 33 mmHg
AV Peak grad: 34.5 mmHg
Ao pk vel: 2.94 m/s
Area-P 1/2: 3.34 cm2
MV VTI: 1.12 cm2
S' Lateral: 3 cm

## 2021-05-17 NOTE — Progress Notes (Signed)
[facility]                                     Cardiology Office Note:    Date:  05/18/2021   ID:  [name], DOB [birth year], MRN [id number]  PCP:  Rusty Aus, MD  [facility] Cardiologist:  Dr. Adolph Pollack ([facility])/ Dr. Angelena Form & Dr. Cyndia Bent (TAVR)  --> wants to swtich back to Dr. [facility] Electrophysiologist:  None   Referring MD: Rusty Aus, MD   1 month s/p TAVR  History of Present Illness:    [name] is a 75 y.o. 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    She was admitted to [facility] in 2017 with unstable angina and cardiac catheterization showed a patent LIMA to the LAD and patent saphenous vein graft to the RCA with progression of in-stent restenosis in the proximal left circumflex that was treated with Cutting Balloon angioplasty.  She presented again in April 2019 feeling poorly with chest pressure, shortness of breath, fatigue, and dizziness.  Repeat catheterization showed patent bypass grafts and a patent left circumflex stent with mild restenosis. Echo showed a normal functioning bioprosthetic aortic valve at that time.  She was admitted to [facility] with unstable angina in June 2022 and had stenting of the left circumflex and the saphenous vein graft to the RCA.  Repeat catheterization on 02/10/2021 showed stable coronary disease with chronic total occlusion of the mid LAD and mid RCA with patent left circumflex stents with moderately severe distal AV groove stenosis with both bypass grafts patent. Echocardiogram on 03/01/2021 showed severe LVH with an  ejection fraction at 60 to 65% with a severely stenotic prosthetic aortic valve, mean gradient of 41 mmHg and a peak gradient of 70 mmHg. She continued to report severe shortness of breath with any activity as well as at rest.    The patient was evaluated by the multidisciplinary valve team and felt to have severe, symptomatic aortic stenosis and to be a suitable candidate for TAVR. She underwent a successful procedure with a 23 mm Canizares Sapien 3 Ultra Resilia THV via the TF approach on 04/18/21. Post operative echo showed EF 60%, normally functioning TAVR with a mean gradient of 32 mmHg and trivial PVL. There was turbulent flow in the LVOT with evidence of the torn chord seen prior to TAVR implantation. Peak LVOT gradient was 8 mm Hg. Case re-discussed at follow up TAVR meeting with plan to continue with medical therapy at this time. No plans for further valve intervention. She was continued on Asprin and Plavix. Her Hb was marginal and she was transfused with 1 unit PRBCs to 8.4 prior to d/c. She has not had much symptomatic improvement since TAVR.  Today she presents with her husband. She is still not doing well. Struggles with doing most anything. Basically sits around all day. Any level of activity causes significant dyspnea and that causes her to panic. Husband urges her to try to move more.  No chest pain. No dizziness or syncope.   Past Medical History:  Diagnosis Date   1st degree AV block    ACE-inhibitor cough    Allergic rhinitis    Anemia    iron deficiency anemia   Anxiety    Aortic ectasia (HCC)    a. CT abd in 12/2016 incidentally noted aortic atherosclerosis and infrarenal abdominal aortic ectasia measuring as large as 2.7 cm with recommendation to repeat US in 2023.   Arthritis    Asthma    Cataract    Chronic depression    Chronic diastolic CHF (congestive heart failure) (HCC)    Chronic headache    COPD (chronic obstructive pulmonary disease) (HCC)    Coronary artery  disease    a. DES to RCA and mid Cx 2009. b. CABG and bioprosthetic AVR May 2015. c. cutting balloon to prox Cx in 05/2016   Diabetes mellitus    type 2   Diverticulitis of colon    Essential hypertension    GERD (gastroesophageal reflux disease)    Hearing loss    History of blood transfusion 2013   History of prosthetic aortic valve replacement    HOH (hard of hearing)    Hypercholesterolemia    intolerance of statins and niaspan   IDA (iron deficiency anemia) 02/03/2019   Mobitz type 1 second degree AV block    OSA (obstructive sleep apnea)    mild, intolerant of cpap   PAD (peripheral artery disease) ([place])    a. atherosclerosis by CT abd 12/2016 in [place].   PONV (postoperative nausea and vomiting)    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   s/p TAVR with a 23 mm Grosshans S3UR via the TF approach by Dr. Sumner Boast & Dr. Cyndia Bent   Statin intolerance    Thyroid disease     Past Surgical History:  Procedure Laterality Date   ABDOMINAL HYSTERECTOMY     ABDOMINAL HYSTERECTOMY W/ PARTIAL VAGINACTOMY     AORTIC VALVE REPLACEMENT N/A 10/12/2013   Procedure: AORTIC VALVE REPLACEMENT (AVR);  Surgeon: [name], MD;  Location: [place];  Service: Open Heart Surgery;  Laterality: N/A;   APPENDECTOMY  1964   BARTHOLIN GLAND CYST EXCISION     BLADDER SUSPENSION     BREAST BIOPSY Bilateral 09/11/2000   neg   BREAST BIOPSY Left 07/24/2010   neg   BREAST CYST EXCISION  1988   bilateral nonmalignant tumors, x3   CARDIAC CATHETERIZATION     CARDIAC CATHETERIZATION N/A 05/25/2016   Procedure: Coronary Balloon Angioplasty;  Surgeon: Leonie Man, MD;  Location: [place] [facility];  Service: Cardiovascular;  Laterality: N/A;   CARDIAC CATHETERIZATION N/A 05/25/2016   Procedure: Coronary/Graft Angiography;  Surgeon: Leonie Man, MD;  Location: [place] [facility];  Service: Cardiovascular;  Laterality: N/A;   CATARACT EXTRACTION W/ INTRAOCULAR LENS   IMPLANT, BILATERAL     CHOLECYSTECTOMY  2001   COLECTOMY     lap sigmoid   COLONOSCOPY  2014   polyps found, 2 clamped off.   CORONARY ANGIOPLASTY  10/29/2007   Prox RCA & Mid Cx.   CORONARY ARTERY BYPASS GRAFT N/A 10/12/2013   Procedure: CORONARY ARTERY BYPASS GRAFT TIMES TWO;  Surgeon: [name], MD;  Location: [place] [facility];  Service: Open Heart Surgery;  Laterality: N/A;   CORONARY/GRAFT ANGIOGRAPHY N/A 09/20/2017   Procedure: CORONARY/GRAFT ANGIOGRAPHY;  Surgeon: Sherren Mocha, MD;  Location: [place] [facility];  Service:  Cardiovascular;  Laterality: N/A;   INTRAOPERATIVE TRANSTHORACIC ECHOCARDIOGRAM N/A 04/18/2021   Procedure: INTRAOPERATIVE TRANSTHORACIC ECHOCARDIOGRAM;  Surgeon: [name], MD;  Location: [place] [facility];  Service: Open Heart Surgery;  Laterality: N/A;   LEFT HEART CATHETERIZATION WITH CORONARY ANGIOGRAM N/A 10/09/2013   Procedure: LEFT HEART CATHETERIZATION WITH CORONARY ANGIOGRAM;  Surgeon: [name], MD;  Location: [facility] CATH LAB;  Service: Cardiovascular;  Laterality: N/A;   RIGHT/LEFT HEART CATH AND CORONARY ANGIOGRAPHY N/A 11/03/2020   Procedure: RIGHT/LEFT HEART CATH AND CORONARY ANGIOGRAPHY;  Surgeon: Corey Skains, MD;  Location: [place] [facility];  Service: Cardiovascular;  Laterality: N/A;   RIGHT/LEFT HEART CATH AND CORONARY/GRAFT ANGIOGRAPHY N/A 02/10/2021   Procedure: RIGHT/LEFT HEART CATH AND CORONARY/GRAFT ANGIOGRAPHY;  Surgeon: Andrez Grime, MD;  Location: [place] [facility];  Service: Cardiovascular;  Laterality: N/A;   STERNAL WIRES REMOVAL N/A 04/13/2014   Procedure: STERNAL WIRES REMOVAL;  Surgeon: [name], MD;  Location: [place];  Service: Thoracic;  Laterality: N/A;   TEE WITHOUT CARDIOVERSION N/A 11/03/2020   Procedure: TRANSESOPHAGEAL ECHOCARDIOGRAM (TEE);  Surgeon: Corey Skains, MD;  Location: [facility];  Service: Cardiovascular;  Laterality: N/A;   TEE WITHOUT CARDIOVERSION N/A 02/13/2021   Procedure:  TRANSESOPHAGEAL ECHOCARDIOGRAM (TEE);  Surgeon: Corey Skains, MD;  Location: [facility] OR[facility];  Service: Cardiovascular;  Laterality: N/A;   THYROIDECTOMY     TONSILLECTOMY     TRANSCATHETER AORTIC VALVE REPLACEMENT, TRANSFEMORAL N/A 04/18/2021   Procedure: TRANSCATHETER AORTIC VALVE REPLACEMENT, TRANSFEMORAL;  Surgeon: [name], MD;  Location: [place] [facility];  Service: Open Heart Surgery;  Laterality: N/A;   TUBAL LIGATION     VAGINAL DELIVERY     3   VISCERAL ARTERY INTERVENTION N/A 08/16/2016   Procedure: Visceral Artery Intervention;  Surgeon: Algernon Huxley, MD;  Location: [place] [facility];  Service: Cardiovascular;  Laterality: N/A;    Current Medications: Current Meds  Medication Sig   acetaminophen (TYLENOL) 500 MG tablet Take 500-1,000 mg by mouth every 6 (six) hours as needed for mild pain.   albuterol (PROVENTIL) (2.5 MG/3ML) 0.083% nebulizer solution Take 2.5 mg by nebulization every 6 (six) hours as needed for wheezing or shortness of breath.   ANORO ELLIPTA 62.5-25 MCG/INH AEPB Inhale 1 puff into the lungs daily as needed (SOB).   aspirin EC 81 MG EC tablet Take 1 tablet (81 mg total) by mouth daily. Swallow whole.   azelastine (ASTELIN) 0.1 % nasal spray Place 1 spray into both nostrils 2 (two) times daily.   budesonide (PULMICORT) 1 MG/2ML nebulizer solution Inhale 2 mLs into the lungs at bedtime.   clopidogrel (PLAVIX) 75 MG tablet Take 75 mg by mouth daily.   DULoxetine (CYMBALTA) 20 MG capsule Take 1 capsule (20 mg total) by mouth in the morning.   FLUoxetine (PROZAC) 40 MG capsule Take 40 mg by mouth 2 (two) times daily.   Insulin Glargine (BASAGLAR KWIKPEN) 100 UNIT/ML Inject 100 Units into the skin at bedtime.   insulin lispro (HUMALOG) 100 UNIT/ML injection Inject 36-66 Units into the skin 3 (three) times daily before meals.   isosorbide mononitrate (IMDUR) 30 MG 24 hr tablet Take 1 tablet (30 mg total) by mouth daily.   levothyroxine (SYNTHROID,  LEVOTHROID) 125 MCG tablet Take 125 mcg by mouth daily before breakfast.   LORazepam (ATIVAN) 0.5 MG tablet Take by mouth as needed.   losartan (COZAAR) 25 MG tablet Take 1 tablet (25 mg total) by  mouth in the morning.   nitroGLYCERIN (NITROSTAT) 0.4 MG SL tablet Place 1 tablet (0.4 mg total) under the tongue every 5 (five) minutes as needed for chest pain.   ondansetron (ZOFRAN-ODT) 4 MG disintegrating tablet Take 4 mg by mouth 3 (three) times daily as needed for nausea or vomiting.   ONETOUCH ULTRA test strip USE 1 STRIP TO CHECK GLUCOSE THREE TIMES DAILY   pantoprazole (PROTONIX) 40 MG tablet Take 40 mg by mouth in the morning.   promethazine (PHENERGAN) 25 MG tablet Take 1 tablet by mouth 3 (three) times daily as needed.   torsemide (DEMADEX) 20 MG tablet Take 20 mg by mouth daily.   traZODone (DESYREL) 100 MG tablet Take 200 mg by mouth at bedtime.     Allergies:   Amitriptyline, Benadryl [diphenhydramine], Demerol [meperidine], Gabapentin, Mirtazapine, Olanzapine, Voltaren [diclofenac sodium], Zetia [ezetimibe], Ativan [lorazepam], Atorvastatin, Budesonide-formoterol fumarate, Bupropion hcl, Caffeine, Codeine sulfate, Lisinopril, Metformin, Mometasone furoate, Morphine sulfate, Other, Oxycodone-acetaminophen, Pioglitazone, Propoxyphene n-acetaminophen, Rosuvastatin, Shellfish allergy, Suvorexant, Ticagrelor, Tramadol, Venlafaxine, Xanax [alprazolam], Zolpidem tartrate, and Latex   Social History   Socioeconomic History   Marital status: Married    Spouse name: Not on file   Number of children: 3   Years of education: Not on file   Highest education level: Not on file  Occupational History   Occupation: Retired    Fish farm manager: UNEMPLOYED    Comment: CNA  Tobacco Use   Smoking status: Former    Packs/day: 0.50    Years: 30.00    Pack years: 15.00    Types: Cigarettes    Quit date: 10/02/2013    Years since quitting: 7.6   Smokeless tobacco: Never  Vaping Use   Vaping Use: Never  used  Substance and Sexual Activity   Alcohol use: No   Drug use: No   Sexual activity: Not Currently  Other Topics Concern   Not on file  Social History Narrative   Does not have Living Will   Desires CPR, would not want prolonged life support if futile.   Social Determinants of Health   Financial Resource Strain: Not on file  Food Insecurity: Not on file  Transportation Needs: Not on file  Physical Activity: Not on file  Stress: Not on file  Social Connections: Not on file     Family History: The patient's family history includes Asthma in her father; Breast cancer in her maternal aunt and paternal aunt; Breast cancer (age of onset: 2) in her mother; Heart disease in an other family member; Hypertension in her father; Mesothelioma in her father; Stroke in her paternal grandfather.  ROS:   Please see the history of present illness.    All other systems reviewed and are negative.  EKGs/Labs/Other Studies Reviewed:    The following studies were reviewed today:  TAVR OPERATIVE NOTE     Date of Procedure:                04/18/2021   Preoperative Diagnosis:      Severe Aortic Stenosis    Postoperative Diagnosis:    Same    Procedure:        Transcatheter Aortic Valve in Valve Replacement - Percutaneous Right Transfemoral Approach             Wecker Sapien 3 Ultra Resilia THV (size 23 mm, model # 9755RSL, serial # [id number])              Co-Surgeons:  [name], MD and [name], MD     Anesthesiologist:                  Raechel Ache, MD   Echocardiographer:              Osborne Oman, MD   Pre-operative Echo Findings: Severe aortic stenosis Normal left ventricular systolic function   Post-operative Echo Findings: No paravalvular leak Normal left ventricular systolic function   _____________  Echocardiogram 04/19/21:   1. There is a 23 mm Limas Sapien prosthetic (TAVR) valve present in the  aortic position. Effective  orifice area is , by VTI measures 1.39 cm.  Aortic valve mean gradient measures 32.0 mmHg. Peak gradient is 55 mm Hg.  Aortic valve Vmax measures 3.60  m/s. Aortic valve acceleration time measures 96 msec. DVI is 0.44. There  is trivial PVL.   2. There is turbulent flow in the LVOT with evidence of the torn chord  seen prior to TAVR implantation. Peak LVOT gradient is 8 mm Hg.   3. Left ventricular ejection fraction, by estimation, is 60 to 65%. The  left ventricle has normal function. The left ventricle has no regional  wall motion abnormalities. There is moderate concentric left ventricular  hypertrophy. Left ventricular  diastolic parameters are indeterminate.   4. Right ventricular systolic function is normal. The right ventricular  size is normal. There is mildly elevated pulmonary artery systolic  pressure. The estimated right ventricular systolic pressure is 69.6 mmHg.   5. Left atrial size was severely dilated.   6. The mitral valve is abnormal. Mild mitral valve regurgitation. Mild to  moderate mitral stenosis. The mean mitral valve gradient is 8.0 mmHg with  average heart rate of 75 bpm.   7. Tricuspid valve regurgitation is mild to moderate.   8. The inferior vena cava is dilated in size with >50% respiratory  variability, suggesting right atrial pressure of 8 mmHg.   Comparison(s): A prior study was performed on 04/18/21. Improvement in AV  gradients from pre-implantation.   ________________________  Echo 05/17/21 IMPRESSIONS   1. Left ventricular ejection fraction, by estimation, is 55 to 60%. The  left ventricle has normal function. The left ventricle has no regional  wall motion abnormalities. There is moderate left ventricular hypertrophy.  Left ventricular diastolic  parameters are consistent with Grade I diastolic dysfunction (impaired  relaxation).   2. Right ventricular systolic function is normal. The right ventricular  size is normal. There is normal  pulmonary artery systolic pressure. The  estimated right ventricular systolic pressure is 29.5 mmHg.   3. Left atrial size was mildly dilated.   4. Right atrial size was mildly dilated.   5. The mitral valve is degenerative. No evidence of mitral valve  regurgitation. Moderate to severe mitral stenosis. The mean mitral valve  gradient is 9.0 mmHg with MVA by VTI 1.12 cm^2. Moderate mitral annular  calcification with extensive valvular  calcification.   6. Bioprosthetic aortic valve s/p TAVR. 23 mm Schoening Sapien THV. No  perivalvular leakage noted. Mean gradient 33 mmHg is significantly  elevated, dimensionless index 0.31. Would suggest TEE or gated CTA to more  closely evaluate bioprosthetic valve  stenosis.   7. Aortic dilatation noted. There is mild dilatation of the ascending  aorta, measuring 40 mm.   8. The inferior vena cava is normal in size with greater than 50%  respiratory variability, suggesting right atrial pressure of 3 mmHg.   EKG:  EKG  is NOT ordered today.   Recent Labs: 02/01/2021: TSH 7.959 04/14/2021: ALT 13; B Natriuretic Peptide 427.9 04/19/2021: Magnesium 1.9 04/30/2021: BUN 18; Creatinine, Ser 1.48; Hemoglobin 8.2; Platelets 222; Potassium 4.5; Sodium 133  Recent Lipid Panel    Component Value Date/Time   CHOL 173 09/20/2017 0338   CHOL 211 (H) 10/03/2013 0515   TRIG 207 (H) 09/20/2017 0338   TRIG 272 (H) 10/03/2013 0515   HDL 22 (L) 09/20/2017 0338   HDL 22 (L) 10/03/2013 0515   CHOLHDL 7.9 09/20/2017 0338   VLDL 41 (H) 09/20/2017 0338   VLDL 54 (H) 10/03/2013 0515   LDLCALC 110 (H) 09/20/2017 0338   LDLCALC 135 (H) 10/03/2013 0515   LDLDIRECT 127.0 07/07/2014 0820   Physical Exam:    VS:  BP 140/80    Pulse 85    Ht 5\' 6"  (1.676 m)    Wt 208 lb 12.8 oz (94.7 kg)    SpO2 96%    BMI 33.70 kg/m     Wt Readings from Last 3 Encounters:  05/17/21 208 lb 12.8 oz (94.7 kg)  04/26/21 215 lb (97.5 kg)  04/20/21 218 lb 0.6 oz (98.9 kg)    General:  Anxious appearing, NAD Lungs:Clear to ausculation bilaterally. No wheezes, rales, or rhonchi. Breathing is unlabored. Cardiovascular: RRR with S1 S2. + murmur Extremities: Stable edema. Neuro: Alert and oriented. No focal deficits. No facial asymmetry. MAE spontaneously. Psych: Responds to questions appropriately with normal affect.    ASSESSMENT/PLAN:    Severe AS s/p valve in valve TAVR: echo today shows EF 55%, normally functioning TAVR with a mean gradient of 33 mmHg and no PVL. Patient's mean gradient has been persistently elevated since implant likely 2/2 to valve in valve and patient prosthesis mismatch. Do not think CT or TEE is indicated at this time. She has NYHA class III symptoms that are likely multifactorial from anxiety, obesity, lung disease and chronic deconditioning. I have encouraged her to try to push herself to exercise. She will continue on aspirin and clopidogrel (for PCI in the setting of unstable angina). She has full dentures and does not need SBE prophylaxis.   Chronic diastolic CHF: appears euvolemic. No changes made.   Chronic anemia: Hb stable at 8.2 today. Groin sites stable. No concern about active bleeding.    CAD: underwent PCI to LCx and SVG to RCA in 11/2020 in the setting of unstable angina. Cath 02/2021 showed severe native three-vessel coronary artery disease including CTO of the mid LAD, CTO of the mid RCA, and patent stent in the mid left circumflex, patent SVG to RCA with 2 patent stents in the proximal and distal segments, patent LIMA to LAD. Continue DAPT for at least 1 year. No anginal symptoms.    HTN: BP well controlled today. No changes made.    CKD stage IIIa: creatinine has been stable ~1.3  Anxiety: Debilitating. Now on lorazepam.   Medication Adjustments/Labs and Tests Ordered: Current medicines are reviewed at length with the patient today.  Concerns regarding medicines are outlined above.  No orders of the defined types were placed in this  encounter.  No orders of the defined types were placed in this encounter.   Patient Instructions  Medication Instructions:  Your physician recommends that you continue on your current medications as directed. Please refer to the Current Medication list given to you today.  *If you need a refill on your cardiac medications before your next appointment, please call your pharmacy*  Lab Work: None ordered   If you have labs (blood work) drawn today and your tests are completely normal, you will receive your results only by: [place] (if you have MyChart) OR A paper copy in the mail If you have any lab test that is abnormal or we need to change your treatment, we will call you to review the results.   Testing/Procedures: None ordered    Follow-Up: Follow up as scheduled    Other Instructions None    Signed, Angelena Form, PA-C  05/18/2021 11:50 AM    [place]

## 2021-05-17 NOTE — Patient Instructions (Signed)
Medication Instructions:  Your physician recommends that you continue on your current medications as directed. Please refer to the Current Medication list given to you today.  *If you need a refill on your cardiac medications before your next appointment, please call your pharmacy*   Lab Work: None ordered   If you have labs (blood work) drawn today and your tests are completely normal, you will receive your results only by: MyChart Message (if you have MyChart) OR A paper copy in the mail If you have any lab test that is abnormal or we need to change your treatment, we will call you to review the results.   Testing/Procedures: None ordered    Follow-Up: Follow up as scheduled    Other Instructions None   

## 2021-05-21 ENCOUNTER — Emergency Department
Admission: EM | Admit: 2021-05-21 | Discharge: 2021-05-21 | Disposition: A | Discharge status: Home / Self Care | Payer: Medicare HMO | Attending: Emergency Medicine | Admitting: Emergency Medicine

## 2021-05-21 ENCOUNTER — Encounter: Payer: Self-pay | Admitting: Emergency Medicine

## 2021-05-21 ENCOUNTER — Other Ambulatory Visit: Payer: Self-pay

## 2021-05-21 ENCOUNTER — Emergency Department: Payer: Medicare HMO

## 2021-05-21 DIAGNOSIS — R0602 Shortness of breath: Secondary | ICD-10-CM | POA: Insufficient documentation

## 2021-05-21 DIAGNOSIS — R002 Palpitations: Secondary | ICD-10-CM

## 2021-05-21 DIAGNOSIS — Z5321 Procedure and treatment not carried out due to patient leaving prior to being seen by health care provider: Secondary | ICD-10-CM | POA: Diagnosis not present

## 2021-05-21 DIAGNOSIS — R0600 Dyspnea, unspecified: Secondary | ICD-10-CM

## 2021-05-21 DIAGNOSIS — F419 Anxiety disorder, unspecified: Secondary | ICD-10-CM

## 2021-05-21 LAB — CBC WITH DIFFERENTIAL/PLATELET
Abs Immature Granulocytes: 0.09 10*3/uL — ABNORMAL HIGH (ref 0.00–0.07)
Basophils Absolute: 0.1 10*3/uL (ref 0.0–0.1)
Basophils Relative: 1 %
Eosinophils Absolute: 0.5 10*3/uL (ref 0.0–0.5)
Eosinophils Relative: 5 %
HCT: 27.7 % — ABNORMAL LOW (ref 36.0–46.0)
Hemoglobin: 8.5 g/dL — ABNORMAL LOW (ref 12.0–15.0)
Immature Granulocytes: 1 %
Lymphocytes Relative: 21 %
Lymphs Abs: 2.2 10*3/uL (ref 0.7–4.0)
MCH: 25.2 pg — ABNORMAL LOW (ref 26.0–34.0)
MCHC: 30.7 g/dL (ref 30.0–36.0)
MCV: 82.2 fL (ref 80.0–100.0)
Monocytes Absolute: 0.6 10*3/uL (ref 0.1–1.0)
Monocytes Relative: 6 %
Neutro Abs: 6.7 10*3/uL (ref 1.7–7.7)
Neutrophils Relative %: 66 %
Platelets: 192 10*3/uL (ref 150–400)
RBC: 3.37 MIL/uL — ABNORMAL LOW (ref 3.87–5.11)
RDW: 15.3 % (ref 11.5–15.5)
WBC: 10.2 10*3/uL (ref 4.0–10.5)
nRBC: 0 % (ref 0.0–0.2)

## 2021-05-21 LAB — COMPREHENSIVE METABOLIC PANEL
ALT: 13 U/L (ref 0–44)
AST: 28 U/L (ref 15–41)
Albumin: 3.1 g/dL — ABNORMAL LOW (ref 3.5–5.0)
Alkaline Phosphatase: 108 U/L (ref 38–126)
Anion gap: 8 (ref 5–15)
BUN: 19 mg/dL (ref 8–23)
CO2: 24 mmol/L (ref 22–32)
Calcium: 8.1 mg/dL — ABNORMAL LOW (ref 8.9–10.3)
Chloride: 97 mmol/L — ABNORMAL LOW (ref 98–111)
Creatinine, Ser: 1.24 mg/dL — ABNORMAL HIGH (ref 0.44–1.00)
GFR, Estimated: 45 mL/min — ABNORMAL LOW (ref >60)
Glucose, Bld: 302 mg/dL — ABNORMAL HIGH (ref 70–99)
Potassium: 4 mmol/L (ref 3.5–5.1)
Sodium: 129 mmol/L — ABNORMAL LOW (ref 135–145)
Total Bilirubin: 0.5 mg/dL (ref 0.3–1.2)
Total Protein: 6.7 g/dL (ref 6.5–8.1)

## 2021-05-21 LAB — BRAIN NATRIURETIC PEPTIDE: B Natriuretic Peptide: 378.2 pg/mL — ABNORMAL HIGH (ref 0.0–100.0)

## 2021-05-21 LAB — TROPONIN I (HIGH SENSITIVITY): Troponin I (High Sensitivity): 39 ng/L — ABNORMAL HIGH (ref <18)

## 2021-05-21 NOTE — ED Notes (Signed)
Observed pt being taken to her car from lobby by family member,  Martin Majestic out to car and asked if she was leaving and pt slammed car door refusing to answer.  Pts husband states "she does this all the time, sits up here and then leaves after a few hours."

## 2021-05-21 NOTE — ED Triage Notes (Addendum)
Pt arrived via POV from home with reports of waking up about 1 hour PTA with her heart racing hard and fast, pt also c/o shortness of breath. Pt denies any chest pain at this time.  Pt was 84% on RA placed 2L . Pt did not arrived to ED with oxygen tank, pt states " I don't have any to bring with me"  Pt sees cardiology in [place]

## 2021-05-21 NOTE — ED Provider Notes (Addendum)
Emergency Medicine Provider Triage Evaluation Note  [name] , a 75 y.o. female  was evaluated in triage.  Pt complains of palpitations/racing heart.  She believes she was more short of breath than usual although she was using her home oxygen.  Had her husband bring her to the emergency department without any oxygen so she was 84% upon arrival but came up to 95% on 2 L of oxygen by nasal cannula.  Anxious.    Review of Systems  Positive: Anxiety, palpitations, racing heart, shortness of breath Negative: Chest pain, abdominal pain, nausea/vomiting, fever  Physical Exam  BP (!) 155/84 (BP Location: Right Arm)    Pulse 92    Temp 98.4 F (36.9 C) (Oral)    Resp (!) 30    Ht 1.676 m (5\' 6" )    Wt 94.7 kg    SpO2 95%    BMI 33.70 kg/m  Gen:   Awake, anxious Resp:  Increased rate but no accessory muscle usage.  Lungs are clear to auscultation bilaterally. MSK:   Moves extremities without difficulty  Other:  No abdominal tenderness.  Good peripheral perfusion  Medical Decision Making  Medically screening exam initiated at 2:07 AM.  Appropriate orders placed.  [name] was informed that the remainder of the evaluation will be completed by another provider, this initial triage assessment does not replace that evaluation, and the importance of remaining in the ED until their evaluation is complete.  Labs and chest x-ray pending.  Suspect anxiety attack, lungs are clear to auscultation, hypoxemia resolved after being put back on the chronic oxygen she is supposed to wear at all times.  No clear evidence of ischemia on EKG.  ED ECG REPORT I, Hinda Kehr, the attending physician, personally viewed and interpreted this ECG.  Date: 05/21/2021 EKG Time: 1:55 AM Rate: 95 Rhythm: sinus rhythm with premature atrial complexes QRS Axis: normal Intervals: normal, slightly prolonged QTC of 490 ms ST/T Wave abnormalities: Non-specific ST segment / T-wave changes, but no clear evidence of acute  ischemia. Narrative Interpretation: no definitive evidence of acute ischemia; does not meet STEMI criteria.   Hinda Kehr, MD 05/21/21 [phone]

## 2021-05-24 ENCOUNTER — Encounter: Payer: Self-pay | Admitting: Oncology

## 2021-05-27 ENCOUNTER — Encounter: Payer: Self-pay | Admitting: Emergency Medicine

## 2021-05-27 ENCOUNTER — Emergency Department
Admission: EM | Admit: 2021-05-27 | Discharge: 2021-05-29 | Disposition: A | Discharge status: Home / Self Care | Payer: Medicare HMO | Attending: Emergency Medicine | Admitting: Emergency Medicine

## 2021-05-27 ENCOUNTER — Emergency Department: Payer: Medicare HMO

## 2021-05-27 ENCOUNTER — Other Ambulatory Visit: Payer: Self-pay

## 2021-05-27 DIAGNOSIS — J45909 Unspecified asthma, uncomplicated: Secondary | ICD-10-CM | POA: Insufficient documentation

## 2021-05-27 DIAGNOSIS — Z7902 Long term (current) use of antithrombotics/antiplatelets: Secondary | ICD-10-CM | POA: Insufficient documentation

## 2021-05-27 DIAGNOSIS — E039 Hypothyroidism, unspecified: Secondary | ICD-10-CM | POA: Diagnosis not present

## 2021-05-27 DIAGNOSIS — Z951 Presence of aortocoronary bypass graft: Secondary | ICD-10-CM | POA: Insufficient documentation

## 2021-05-27 DIAGNOSIS — S8992XA Unspecified injury of left lower leg, initial encounter: Secondary | ICD-10-CM | POA: Diagnosis present

## 2021-05-27 DIAGNOSIS — Z794 Long term (current) use of insulin: Secondary | ICD-10-CM | POA: Diagnosis not present

## 2021-05-27 DIAGNOSIS — I5033 Acute on chronic diastolic (congestive) heart failure: Secondary | ICD-10-CM | POA: Insufficient documentation

## 2021-05-27 DIAGNOSIS — Z7951 Long term (current) use of inhaled steroids: Secondary | ICD-10-CM | POA: Insufficient documentation

## 2021-05-27 DIAGNOSIS — W010XXA Fall on same level from slipping, tripping and stumbling without subsequent striking against object, initial encounter: Secondary | ICD-10-CM | POA: Diagnosis not present

## 2021-05-27 DIAGNOSIS — S8990XA Unspecified injury of unspecified lower leg, initial encounter: Secondary | ICD-10-CM

## 2021-05-27 DIAGNOSIS — Z91040 Latex allergy status: Secondary | ICD-10-CM | POA: Insufficient documentation

## 2021-05-27 DIAGNOSIS — N1831 Chronic kidney disease, stage 3a: Secondary | ICD-10-CM | POA: Diagnosis not present

## 2021-05-27 DIAGNOSIS — Z7984 Long term (current) use of oral hypoglycemic drugs: Secondary | ICD-10-CM | POA: Insufficient documentation

## 2021-05-27 DIAGNOSIS — J441 Chronic obstructive pulmonary disease with (acute) exacerbation: Secondary | ICD-10-CM | POA: Insufficient documentation

## 2021-05-27 DIAGNOSIS — I2510 Atherosclerotic heart disease of native coronary artery without angina pectoris: Secondary | ICD-10-CM | POA: Insufficient documentation

## 2021-05-27 DIAGNOSIS — D631 Anemia in chronic kidney disease: Secondary | ICD-10-CM | POA: Insufficient documentation

## 2021-05-27 DIAGNOSIS — Z87891 Personal history of nicotine dependence: Secondary | ICD-10-CM | POA: Diagnosis not present

## 2021-05-27 DIAGNOSIS — Z79899 Other long term (current) drug therapy: Secondary | ICD-10-CM | POA: Insufficient documentation

## 2021-05-27 DIAGNOSIS — Z7982 Long term (current) use of aspirin: Secondary | ICD-10-CM | POA: Diagnosis not present

## 2021-05-27 DIAGNOSIS — E1122 Type 2 diabetes mellitus with diabetic chronic kidney disease: Secondary | ICD-10-CM | POA: Insufficient documentation

## 2021-05-27 DIAGNOSIS — I130 Hypertensive heart and chronic kidney disease with heart failure and stage 1 through stage 4 chronic kidney disease, or unspecified chronic kidney disease: Secondary | ICD-10-CM | POA: Diagnosis not present

## 2021-05-27 MED ORDER — ONDANSETRON 4 MG PO TBDP
4.0000 mg | ORAL_TABLET | Freq: Once | ORAL | Status: AC
  Administered 2021-05-27: 05:00:00 4 mg via ORAL
  Filled 2021-05-27: qty 1

## 2021-05-27 MED ORDER — BACITRACIN-NEOMYCIN-POLYMYXIN 400-5-5000 EX OINT
TOPICAL_OINTMENT | Freq: Once | CUTANEOUS | Status: AC
  Filled 2021-05-27: qty 1

## 2021-05-27 MED ORDER — HYDROCODONE-ACETAMINOPHEN 5-325 MG PO TABS
2.0000 | ORAL_TABLET | Freq: Once | ORAL | Status: AC
  Administered 2021-05-27: 05:00:00 2 via ORAL
  Filled 2021-05-27: qty 2

## 2021-05-27 MED ORDER — ONDANSETRON 4 MG PO TBDP
4.0000 mg | ORAL_TABLET | Freq: Once | ORAL | Status: AC
  Administered 2021-05-27: 10:00:00 4 mg via ORAL
  Filled 2021-05-27: qty 1

## 2021-05-27 MED ORDER — HYDROCODONE-ACETAMINOPHEN 5-325 MG PO TABS: 1.0000 | ORAL_TABLET | Freq: Four times a day (QID) | ORAL | 0 refills | Status: AC | PRN

## 2021-05-27 NOTE — ED Notes (Signed)
Pt's spouse and pt very concerned about wait, explanation of wait provided to both. Ice applied to left knee.

## 2021-05-27 NOTE — ED Triage Notes (Signed)
Pt to ED via [facility] with c/o L knee pain after a fall. Per EMS pt denies LOC, denies hitting head, per EMS pt on 3L chronic [place]. CBG 185, BP 190/115. Pt placed on stretcher in triage. Swelling/bruising noted to L knee on arrival to ED, pt c/o severe pain.

## 2021-05-27 NOTE — ED Provider Notes (Signed)
[facility] Emergency Department Provider Note ____________________________________________   Event Date/Time   First MD Initiated Contact with Patient 05/27/21 [phone]     (approximate)  I have reviewed the triage vital signs and the nursing notes.   HISTORY  Chief Complaint Knee Pain  HPI [name] is a 75 y.o. female with history of multiple chronic medical issues as listed below presents to the emergency department for treatment and evaluation of left knee pain.  Patient states that she was trying to transfer from the recliner to her bedside commode and she tripped and landed on her left knee she did not hit her head or lose consciousness.  No other pain or concerns outside of the left knee pain.     Past Medical History:  Diagnosis Date   1st degree AV block    ACE-inhibitor cough    Allergic rhinitis    Anemia    iron deficiency anemia   Anxiety    Aortic ectasia (HCC)    a. CT abd in 12/2016 incidentally noted aortic atherosclerosis and infrarenal abdominal aortic ectasia measuring as large as 2.7 cm with recommendation to repeat US in 2023.   Arthritis    Asthma    Cataract    Chronic depression    Chronic diastolic CHF (congestive heart failure) (HCC)    Chronic headache    COPD (chronic obstructive pulmonary disease) (HCC)    Coronary artery disease    a. DES to RCA and mid Cx 2009. b. CABG and bioprosthetic AVR May 2015. c. cutting balloon to prox Cx in 05/2016   Diabetes mellitus    type 2   Diverticulitis of colon    Essential hypertension    GERD (gastroesophageal reflux disease)    Hearing loss    History of blood transfusion 2013   History of prosthetic aortic valve replacement    HOH (hard of hearing)    Hypercholesterolemia    intolerance of statins and niaspan   IDA (iron deficiency anemia) 02/03/2019   Mobitz type 1 second degree AV block    OSA (obstructive sleep apnea)    mild, intolerant of cpap   PAD (peripheral  artery disease) ([place])    a. atherosclerosis by CT abd 12/2016 in [place].   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 [place] Boast & Dr. 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   Acute on chronic diastolic (congestive) heart failure ([place]) 11/24/2020   SOB (shortness of breath) 11/24/2020   Acute hyponatremia 11/24/2020   Prolonged QT interval 11/24/2020   Angina pectoris ([place]) 11/03/2020   CHF exacerbation ([place]) 09/07/2020   Elevated troponin 09/07/2020   Chest pain 08/09/2020   Acute on chronic diastolic CHF (congestive heart failure) ([place]) 08/08/2020   Anemia due to stage 3a chronic kidney disease ([place]) 05/21/2019   IDA (iron deficiency anemia) 02/03/2019   COPD exacerbation ([place]) 12/28/2017   Depression, major, single episode, complete remission ([place]) 04/11/2017   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   Aortic ectasia, abdominal ([place]) 01/01/2017   GAD (generalized anxiety disorder) 09/26/2016   Acute posthemorrhagic anemia 08/24/2016   Acute respiratory failure with hypoxia (  [place]) 00/17/4944   Acute diastolic CHF (congestive heart failure) ([place]) 08/24/2016   Hyponatremia 08/24/2016   Leukocytosis 08/24/2016   Fever 08/24/2016   Diarrhea 08/24/2016   Generalized weakness 08/24/2016   Acute diverticulitis 08/16/2016   Gastrointestinal hemorrhage    Ataxia 08/09/2016   Dizziness 08/09/2016   Staggering gait 08/09/2016   AVB (atrioventricular block)    Atypical chest pain    Unstable angina ([place]) 05/25/2016   Palpitations 05/25/2016   PUD - recent Rx for H.Pylori 05/25/2016   Stenosis of coronary artery stent    Recurrent major depressive disorder, in partial remission  ([place]) 07/29/2015   Sialadenitis 04/25/2015   Viral URI 03/24/2015   Hypoglycemia 08/25/2014   Nausea without vomiting 08/25/2014   Dyslipidemia-statin ontol    Long-term insulin use ([place]) 03/31/2014   Aortic valve disorder 11/11/2013   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 10/12/2013   Coronary Artery Disease s/p CABG 10/2013 10/10/2013   Other malaise and fatigue 09/11/2013   Alopecia 03/06/2013   Insomnia 02/06/2013   Gastroesophageal reflux disease without esophagitis 01/19/2013   Dysphagia, unspecified(787.20) 01/19/2013   Dyspnea 11/17/2012   Weakness 11/17/2012   Abnormal MRI of head 09/05/2012   Depression with anxiety 03/21/2012   Extrinsic asthma 11/12/2011   Anemia 06/07/2011   Chronic obstructive pulmonary disease ([place]) 01/25/2010   Acquired hypothyroidism 08/10/2009   MICROSCOPIC HEMATURIA 05/07/2009   GOITER 03/02/2009   Essential hypertension 01/06/2009   MEMORY LOSS 06/23/2008   Obstructive sleep apnea 12/25/2007   DM type 2 (diabetes mellitus, type 2) ([place]) 09/09/2006   ALLERGIC RHINITIS 09/09/2006   DIVERTICULOSIS, COLON 09/09/2006    Past Surgical History:  Procedure Laterality Date   ABDOMINAL HYSTERECTOMY     ABDOMINAL HYSTERECTOMY W/ PARTIAL VAGINACTOMY     AORTIC VALVE REPLACEMENT N/A 10/12/2013   Procedure: AORTIC VALVE REPLACEMENT (AVR);  Surgeon: [name], MD;  Location: [facility];  Service: Open Heart Surgery;  Laterality: N/A;   APPENDECTOMY  1964   BARTHOLIN GLAND CYST EXCISION     BLADDER SUSPENSION     BREAST BIOPSY Bilateral 09/11/2000   neg   BREAST BIOPSY Left 07/24/2010   neg   BREAST CYST EXCISION  1988   bilateral nonmalignant tumors, x3   CARDIAC CATHETERIZATION     CARDIAC CATHETERIZATION N/A 05/25/2016   Procedure: Coronary Balloon Angioplasty;  Surgeon: Leonie Man, MD;  Location: [place] [facility] [facility];  Service: Cardiovascular;  Laterality: N/A;   CARDIAC CATHETERIZATION N/A 05/25/2016   Procedure: Coronary/Graft Angiography;  Surgeon: Leonie Man, MD;  Location: [place] [facility];  Service: Cardiovascular;  Laterality: N/A;   CATARACT EXTRACTION W/ INTRAOCULAR LENS  IMPLANT, BILATERAL     CHOLECYSTECTOMY  2001   COLECTOMY     lap sigmoid   COLONOSCOPY  2014   polyps found, 2 clamped off.   CORONARY ANGIOPLASTY  10/29/2007   Prox RCA & Mid Cx.   CORONARY ARTERY BYPASS GRAFT N/A 10/12/2013   Procedure: CORONARY ARTERY BYPASS GRAFT TIMES TWO;  Surgeon: [name], MD;  Location: [facility];  Service: Open Heart Surgery;  Laterality: N/A;   CORONARY/GRAFT ANGIOGRAPHY N/A 09/20/2017   Procedure: CORONARY/GRAFT ANGIOGRAPHY;  Surgeon: Sherren Mocha, MD;  Location: [place];  Service: Cardiovascular;  Laterality: N/A;   INTRAOPERATIVE TRANSTHORACIC ECHOCARDIOGRAM N/A 04/18/2021   Procedure: INTRAOPERATIVE TRANSTHORACIC ECHOCARDIOGRAM;  Surgeon: [name], MD;  Location: [place] [facility];  Service: Open Heart Surgery;  Laterality: N/A;   LEFT  HEART CATHETERIZATION WITH CORONARY ANGIOGRAM N/A 10/09/2013   Procedure: LEFT HEART CATHETERIZATION WITH CORONARY ANGIOGRAM;  Surgeon: [name], MD;  Location[facility];  Service: Cardiovascular;  Laterality: N/A;   RIGHT/LEFT HEART CATH AND CORONARY ANGIOGRAPHY N/A 11/03/2020   Procedure: RIGHT/LEFT HEART CATH AND CORONARY ANGIOGRAPHY;  Surgeon: Corey Skains, MD;  Location: [place] [facility];  Service: Cardiovascular;  Laterality: N/A;   RIGHT/LEFT HEART CATH AND CORONARY/GRAFT ANGIOGRAPHY N/A 02/10/2021   Procedure: RIGHT/LEFT HEART CATH AND CORONARY/GRAFT ANGIOGRAPHY;  Surgeon: Andrez Grime, MD;  Location: [place] [facility];  Service: Cardiovascular;  Laterality: N/A;   STERNAL WIRES REMOVAL N/A 04/13/2014   Procedure: STERNAL WIRES REMOVAL;  Surgeon: [name], MD;  Location: [facility];  Service: Thoracic;  Laterality: N/A;   TEE WITHOUT CARDIOVERSION N/A 11/03/2020   Procedure: TRANSESOPHAGEAL ECHOCARDIOGRAM (TEE);  Surgeon: Corey Skains, MD;  Location: [facility];  Service: Cardiovascular;  Laterality: N/A;   TEE WITHOUT CARDIOVERSION N/A 02/13/2021   Procedure: TRANSESOPHAGEAL ECHOCARDIOGRAM (TEE);  Surgeon: Corey Skains, MD;  Location: [facility];  Service: Cardiovascular;  Laterality: N/A;   THYROIDECTOMY     TONSILLECTOMY     TRANSCATHETER AORTIC VALVE REPLACEMENT, TRANSFEMORAL N/A 04/18/2021   Procedure: TRANSCATHETER AORTIC VALVE REPLACEMENT, TRANSFEMORAL;  Surgeon: [name], MD;  Location: [place] [facility];  Service: Open Heart Surgery;  Laterality: N/A;   TUBAL LIGATION     VAGINAL DELIVERY     3   VISCERAL ARTERY INTERVENTION N/A 08/16/2016   Procedure: Visceral Artery Intervention;  Surgeon: Algernon Huxley, MD;  Location: [place] [facility];  Service: Cardiovascular;  Laterality: N/A;    Prior to Admission medications   Medication Sig Start Date End Date Taking? Authorizing Provider  HYDROcodone-acetaminophen (NORCO/VICODIN) 5-325 MG tablet Take 1 tablet by mouth every 6 (six) hours as needed for up to 3 days for severe pain. 05/27/21 05/30/21 Yes Abram Sax B, FNP  acetaminophen (TYLENOL) 500 MG tablet Take 500-1,000 mg by mouth every 6 (six) hours as needed for mild pain.    [provider]  albuterol (PROVENTIL) (2.5 MG/3ML) 0.083% nebulizer solution Take 2.5 mg by nebulization every 6 (six) hours as needed for wheezing or shortness of breath. 06/07/20   [provider]  ANORO ELLIPTA 62.5-25 MCG/INH AEPB Inhale 1 puff into the lungs daily as needed (SOB). 10/11/20   [provider]  aspirin EC 81 MG EC tablet Take 1 tablet (81 mg total) by mouth daily. Swallow whole. 08/10/20   Samuella Cota, MD  azelastine (ASTELIN) 0.1 % nasal spray Place 1 spray into both nostrils 2 (two) times daily. 05/10/21   [provider]  budesonide (PULMICORT) 1 MG/2ML nebulizer solution Inhale 2 mLs into the lungs at bedtime. 02/15/21 02/15/22  [provider]   clopidogrel (PLAVIX) 75 MG tablet Take 75 mg by mouth daily. 11/15/20   [provider]  DULoxetine (CYMBALTA) 20 MG capsule Take 1 capsule (20 mg total) by mouth in the morning. 11/26/20   Jennye Boroughs, MD  FLUoxetine (PROZAC) 40 MG capsule Take 40 mg by mouth 2 (two) times daily. 08/23/20   [provider]  glipiZIDE (GLUCOTROL XL) 10 MG 24 hr tablet Take 10 mg by mouth daily. 05/02/21   [provider]  Insulin Glargine (BASAGLAR KWIKPEN) 100 UNIT/ML Inject 100 Units into the skin at bedtime.    [provider]  insulin lispro (HUMALOG) 100 UNIT/ML injection Inject 36-66  Units into the skin 3 (three) times daily before meals.    [provider]  isosorbide mononitrate (IMDUR) 30 MG 24 hr tablet Take 1 tablet (30 mg total) by mouth daily. 11/03/20 11/03/21  Corey Skains, MD  levothyroxine (SYNTHROID, LEVOTHROID) 125 MCG tablet Take 125 mcg by mouth daily before breakfast. 04/03/17   [provider]  LORazepam (ATIVAN) 0.5 MG tablet Take 0.5 mg by mouth daily. Patient not taking: Reported on 05/17/2021 04/05/21   [provider]  LORazepam (ATIVAN) 0.5 MG tablet Take by mouth as needed. 05/01/21 05/31/21  [provider]  losartan (COZAAR) 25 MG tablet Take 1 tablet (25 mg total) by mouth in the morning. 11/26/20   Jennye Boroughs, MD  nitroGLYCERIN (NITROSTAT) 0.4 MG SL tablet Place 1 tablet (0.4 mg total) under the tongue every 5 (five) minutes as needed for chest pain. 03/06/21   [name], MD  ondansetron (ZOFRAN-ODT) 4 MG disintegrating tablet Take 4 mg by mouth 3 (three) times daily as needed for nausea or vomiting. 02/14/21   [provider]  ONETOUCH ULTRA test strip USE 1 STRIP TO [place] DAILY 05/10/21   [provider]  pantoprazole (PROTONIX) 40 MG tablet Take 40 mg by mouth in the morning.    [provider]  promethazine (PHENERGAN) 25 MG tablet Take 1 tablet  by mouth 3 (three) times daily as needed. 02/15/21   [provider]  torsemide (DEMADEX) 20 MG tablet Take 20 mg by mouth daily. 12/08/20   [provider]  traZODone (DESYREL) 100 MG tablet Take 200 mg by mouth at bedtime.    [provider]    Allergies Amitriptyline, Benadryl [diphenhydramine], Demerol [meperidine], Gabapentin, Mirtazapine, Olanzapine, Voltaren [diclofenac sodium], Zetia [ezetimibe], Ativan [lorazepam], Atorvastatin, Budesonide-formoterol fumarate, Bupropion hcl, Caffeine, Codeine sulfate, Lisinopril, Metformin, Mometasone furoate, Morphine sulfate, Other, Oxycodone-acetaminophen, Pioglitazone, Propoxyphene n-acetaminophen, Rosuvastatin, Shellfish allergy, Suvorexant, Ticagrelor, Tramadol, Venlafaxine, Xanax [alprazolam], Zolpidem tartrate, and Latex  Family History  Problem Relation Age of Onset   Breast cancer Mother 67   Hypertension Father    Mesothelioma Father    Asthma Father    Stroke Paternal Grandfather    Heart disease Other    Breast cancer Maternal Aunt    Breast cancer Paternal Aunt     Social History Social History   Tobacco Use   Smoking status: Former    Packs/day: 0.50    Years: 30.00    Pack years: 15.00    Types: Cigarettes    Quit date: 10/02/2013    Years since quitting: 7.6   Smokeless tobacco: Never  Vaping Use   Vaping Use: Never used  Substance Use Topics   Alcohol use: No   Drug use: No    Review of Systems  Constitutional: No fever/chills Eyes: No visual changes. ENT: No sore throat. Cardiovascular: Denies chest pain. Respiratory: Denies shortness of breath. Gastrointestinal: No abdominal pain.  No nausea, no vomiting.  No diarrhea.  No constipation. Genitourinary: Negative for dysuria. Musculoskeletal: Positive for left knee pain. Skin: Negative for rash. Neurological: Negative for headaches, focal weakness or numbness  ____________________________________________   PHYSICAL EXAM:  VITAL  SIGNS: ED Triage Vitals  Enc Vitals Group     BP 05/27/21 0433 (!) 192/88     Pulse Rate 05/27/21 0433 (!) 106     Resp 05/27/21 0433 (!) 27     Temp 05/27/21 0433 98.5 F (36.9 C)     Temp Source 05/27/21 0433  Oral     SpO2 05/27/21 0433 96 %     Weight 05/27/21 0434 208 lb 12.8 oz (94.7 kg)     Height 05/27/21 0434 5\' 6"  (1.676 m)     Head Circumference --      Peak Flow --      Pain Score 05/27/21 0845 7     Pain Loc --      Pain Edu? --      Excl. in [place]? --     Constitutional: Alert and oriented. Chronically ill appearing and in no acute distress. Eyes: Conjunctivae are normal. Head: Atraumatic. Nose: No congestion/rhinnorhea. Mouth/Throat: Mucous membranes are moist.  Oropharynx non-erythematous. Neck: No stridor.   Hematological/Lymphatic/Immunilogical: No cervical lymphadenopathy. Cardiovascular: Normal rate, regular rhythm. Grossly normal heart sounds.  Good peripheral circulation. Respiratory: Normal respiratory effort.   Gastrointestinal: Soft and nontender.  Musculoskeletal: No lower extremity tenderness nor edema.  No joint effusions. Neurologic:  Normal speech and language. No gross focal neurologic deficits are appreciated. No gait instability. Skin:  Skin is warm, dry and intact. No rash noted. Psychiatric: Mood and affect are normal. Speech and behavior are normal.  ____________________________________________   LABS (all labs ordered are listed, but only abnormal results are displayed)  Labs Reviewed - No data to display ____________________________________________  EKG  Not indicated. ____________________________________________  RADIOLOGY  ED MD interpretation:    No acute bony abnormality of left knee.  I, Sherrie George, personally viewed and evaluated these images (plain radiographs) as part of my medical decision making, as well as reviewing the written report by the radiologist.  Official radiology report(s): DG Knee Complete 4 Views  Left  Result Date: 05/27/2021 CLINICAL DATA:  Pain, swelling, bruising.  Fall. EXAM: LEFT KNEE - COMPLETE 4+ VIEW COMPARISON:  None. FINDINGS: Anterior soft tissue swelling. No acute bony abnormality. Specifically, no fracture, subluxation, or dislocation. No joint effusion. IMPRESSION: Anterior soft tissue swelling.  No acute bony abnormality. Electronically Signed   By: Rolm Baptise M.D.   On: 05/27/2021 05:28    ____________________________________________   PROCEDURES  Procedure(s) performed (including Critical Care):  Procedures  ____________________________________________   INITIAL IMPRESSION / ASSESSMENT AND PLAN     75 year old female presenting to the emergency department after mechanical, nonsyncopal fall at home earlier this morning.  See HPI for further details.   While awaiting ER room assignment, imaging of the left knee was performed.  No indication of acute bony abnormality.  Exam is reassuring.  Patient has no pain in either hip or upper extremities.  She has no neck or back pain.  She is awake, alert, and oriented x4.  She denies striking her head.  Plan will be to put her in a knee immobilizer and provide her with a walker.  She was encouraged to rest, ice, and elevate for the next several days.  She was encouraged to follow-up with orthopedics if symptoms or not improving over the week.  She was advised to return to the emergency department for symptoms of change or worsen if she is unable to schedule appointment with her primary care provider with orthopedics.    As part of my medical decision making, I reviewed the following data within the [place] History obtained from family and [place] Controlled Substance Database  ___________________________________________   FINAL CLINICAL IMPRESSION(S) / ED DIAGNOSES  Final diagnoses:  Knee injury, initial encounter     ED Discharge Orders          Ordered  HYDROcodone-acetaminophen  (NORCO/VICODIN) 5-325 MG tablet  Every 6 hours PRN        05/27/21 0928             [name] was evaluated in Emergency Department on 05/27/2021 for the symptoms described in the history of present illness. She was evaluated in the context of the global COVID-19 pandemic, which necessitated consideration that the patient might be at risk for infection with the SARS-CoV-2 virus that causes COVID-19. Institutional protocols and algorithms that pertain to the evaluation of patients at risk for COVID-19 are in a state of rapid change based on information released by regulatory bodies including the CDC and federal and state organizations. These policies and algorithms were followed during the patient's care in the ED.   Note:  This document was prepared using Dragon voice recognition software and may include unintentional dictation errors.    Victorino Dike, FNP 05/27/21 1234    Lavonia Drafts, MD 05/27/21 1236

## 2021-06-03 ENCOUNTER — Encounter: Payer: Self-pay | Admitting: Emergency Medicine

## 2021-06-03 ENCOUNTER — Inpatient Hospital Stay
Admission: EM | Admit: 2021-06-03 | Discharge: 2021-07-05 | DRG: 193 | Disposition: E | Payer: Medicare HMO | Attending: Internal Medicine | Admitting: Internal Medicine

## 2021-06-03 ENCOUNTER — Emergency Department: Payer: Medicare HMO

## 2021-06-03 ENCOUNTER — Other Ambulatory Visit: Payer: Self-pay

## 2021-06-03 ENCOUNTER — Observation Stay: Payer: Medicare HMO

## 2021-06-03 DIAGNOSIS — J9622 Acute and chronic respiratory failure with hypercapnia: Secondary | ICD-10-CM | POA: Diagnosis present

## 2021-06-03 DIAGNOSIS — E1122 Type 2 diabetes mellitus with diabetic chronic kidney disease: Secondary | ICD-10-CM | POA: Diagnosis present

## 2021-06-03 DIAGNOSIS — G4700 Insomnia, unspecified: Secondary | ICD-10-CM | POA: Diagnosis present

## 2021-06-03 DIAGNOSIS — F410 Panic disorder [episodic paroxysmal anxiety] without agoraphobia: Secondary | ICD-10-CM | POA: Diagnosis present

## 2021-06-03 DIAGNOSIS — I359 Nonrheumatic aortic valve disorder, unspecified: Secondary | ICD-10-CM | POA: Diagnosis present

## 2021-06-03 DIAGNOSIS — E1151 Type 2 diabetes mellitus with diabetic peripheral angiopathy without gangrene: Secondary | ICD-10-CM | POA: Diagnosis present

## 2021-06-03 DIAGNOSIS — F03918 Unspecified dementia, unspecified severity, with other behavioral disturbance: Secondary | ICD-10-CM | POA: Diagnosis present

## 2021-06-03 DIAGNOSIS — I25118 Atherosclerotic heart disease of native coronary artery with other forms of angina pectoris: Secondary | ICD-10-CM

## 2021-06-03 DIAGNOSIS — I10 Essential (primary) hypertension: Secondary | ICD-10-CM

## 2021-06-03 DIAGNOSIS — E7800 Pure hypercholesterolemia, unspecified: Secondary | ICD-10-CM | POA: Diagnosis present

## 2021-06-03 DIAGNOSIS — Z952 Presence of prosthetic heart valve: Secondary | ICD-10-CM

## 2021-06-03 DIAGNOSIS — Z823 Family history of stroke: Secondary | ICD-10-CM

## 2021-06-03 DIAGNOSIS — J9621 Acute and chronic respiratory failure with hypoxia: Secondary | ICD-10-CM | POA: Diagnosis present

## 2021-06-03 DIAGNOSIS — Z993 Dependence on wheelchair: Secondary | ICD-10-CM

## 2021-06-03 DIAGNOSIS — R0602 Shortness of breath: Secondary | ICD-10-CM | POA: Diagnosis not present

## 2021-06-03 DIAGNOSIS — Z6834 Body mass index (BMI) 34.0-34.9, adult: Secondary | ICD-10-CM

## 2021-06-03 DIAGNOSIS — I2781 Cor pulmonale (chronic): Secondary | ICD-10-CM | POA: Diagnosis present

## 2021-06-03 DIAGNOSIS — Z953 Presence of xenogenic heart valve: Secondary | ICD-10-CM

## 2021-06-03 DIAGNOSIS — Z803 Family history of malignant neoplasm of breast: Secondary | ICD-10-CM

## 2021-06-03 DIAGNOSIS — G4733 Obstructive sleep apnea (adult) (pediatric): Secondary | ICD-10-CM | POA: Diagnosis present

## 2021-06-03 DIAGNOSIS — R296 Repeated falls: Secondary | ICD-10-CM | POA: Diagnosis present

## 2021-06-03 DIAGNOSIS — E119 Type 2 diabetes mellitus without complications: Secondary | ICD-10-CM

## 2021-06-03 DIAGNOSIS — J189 Pneumonia, unspecified organism: Secondary | ICD-10-CM | POA: Diagnosis not present

## 2021-06-03 DIAGNOSIS — J441 Chronic obstructive pulmonary disease with (acute) exacerbation: Secondary | ICD-10-CM | POA: Diagnosis present

## 2021-06-03 DIAGNOSIS — F3341 Major depressive disorder, recurrent, in partial remission: Secondary | ICD-10-CM | POA: Diagnosis present

## 2021-06-03 DIAGNOSIS — Z8249 Family history of ischemic heart disease and other diseases of the circulatory system: Secondary | ICD-10-CM

## 2021-06-03 DIAGNOSIS — R0603 Acute respiratory distress: Secondary | ICD-10-CM | POA: Diagnosis not present

## 2021-06-03 DIAGNOSIS — R0600 Dyspnea, unspecified: Secondary | ICD-10-CM | POA: Diagnosis present

## 2021-06-03 DIAGNOSIS — E039 Hypothyroidism, unspecified: Secondary | ICD-10-CM | POA: Diagnosis present

## 2021-06-03 DIAGNOSIS — E785 Hyperlipidemia, unspecified: Secondary | ICD-10-CM

## 2021-06-03 DIAGNOSIS — F418 Other specified anxiety disorders: Secondary | ICD-10-CM

## 2021-06-03 DIAGNOSIS — F411 Generalized anxiety disorder: Secondary | ICD-10-CM | POA: Diagnosis present

## 2021-06-03 DIAGNOSIS — Z79899 Other long term (current) drug therapy: Secondary | ICD-10-CM

## 2021-06-03 DIAGNOSIS — Z951 Presence of aortocoronary bypass graft: Secondary | ICD-10-CM

## 2021-06-03 DIAGNOSIS — D72829 Elevated white blood cell count, unspecified: Secondary | ICD-10-CM | POA: Diagnosis present

## 2021-06-03 DIAGNOSIS — Z87891 Personal history of nicotine dependence: Secondary | ICD-10-CM

## 2021-06-03 DIAGNOSIS — R1310 Dysphagia, unspecified: Secondary | ICD-10-CM | POA: Diagnosis present

## 2021-06-03 DIAGNOSIS — I2510 Atherosclerotic heart disease of native coronary artery without angina pectoris: Secondary | ICD-10-CM | POA: Diagnosis present

## 2021-06-03 DIAGNOSIS — Z8616 Personal history of COVID-19: Secondary | ICD-10-CM

## 2021-06-03 DIAGNOSIS — E890 Postprocedural hypothyroidism: Secondary | ICD-10-CM | POA: Diagnosis present

## 2021-06-03 DIAGNOSIS — Z9981 Dependence on supplemental oxygen: Secondary | ICD-10-CM

## 2021-06-03 DIAGNOSIS — T82855A Stenosis of coronary artery stent, initial encounter: Secondary | ICD-10-CM | POA: Diagnosis present

## 2021-06-03 DIAGNOSIS — Z9049 Acquired absence of other specified parts of digestive tract: Secondary | ICD-10-CM

## 2021-06-03 DIAGNOSIS — D509 Iron deficiency anemia, unspecified: Secondary | ICD-10-CM | POA: Diagnosis present

## 2021-06-03 DIAGNOSIS — I5033 Acute on chronic diastolic (congestive) heart failure: Secondary | ICD-10-CM | POA: Diagnosis present

## 2021-06-03 DIAGNOSIS — Z66 Do not resuscitate: Secondary | ICD-10-CM | POA: Diagnosis present

## 2021-06-03 DIAGNOSIS — J449 Chronic obstructive pulmonary disease, unspecified: Secondary | ICD-10-CM | POA: Diagnosis present

## 2021-06-03 DIAGNOSIS — I130 Hypertensive heart and chronic kidney disease with heart failure and stage 1 through stage 4 chronic kidney disease, or unspecified chronic kidney disease: Secondary | ICD-10-CM | POA: Diagnosis present

## 2021-06-03 DIAGNOSIS — Z7189 Other specified counseling: Secondary | ICD-10-CM

## 2021-06-03 DIAGNOSIS — J440 Chronic obstructive pulmonary disease with acute lower respiratory infection: Secondary | ICD-10-CM | POA: Diagnosis present

## 2021-06-03 DIAGNOSIS — Z515 Encounter for palliative care: Secondary | ICD-10-CM

## 2021-06-03 DIAGNOSIS — D631 Anemia in chronic kidney disease: Secondary | ICD-10-CM | POA: Diagnosis present

## 2021-06-03 DIAGNOSIS — Z794 Long term (current) use of insulin: Secondary | ICD-10-CM

## 2021-06-03 DIAGNOSIS — E669 Obesity, unspecified: Secondary | ICD-10-CM | POA: Diagnosis present

## 2021-06-03 DIAGNOSIS — N1832 Chronic kidney disease, stage 3b: Secondary | ICD-10-CM | POA: Diagnosis present

## 2021-06-03 DIAGNOSIS — Z79890 Hormone replacement therapy (postmenopausal): Secondary | ICD-10-CM

## 2021-06-03 DIAGNOSIS — Z90710 Acquired absence of both cervix and uterus: Secondary | ICD-10-CM

## 2021-06-03 DIAGNOSIS — N179 Acute kidney failure, unspecified: Secondary | ICD-10-CM | POA: Diagnosis present

## 2021-06-03 DIAGNOSIS — F03911 Unspecified dementia, unspecified severity, with agitation: Secondary | ICD-10-CM | POA: Diagnosis present

## 2021-06-03 DIAGNOSIS — K219 Gastro-esophageal reflux disease without esophagitis: Secondary | ICD-10-CM | POA: Diagnosis present

## 2021-06-03 HISTORY — DX: Unspecified dementia, unspecified severity, without behavioral disturbance, psychotic disturbance, mood disturbance, and anxiety: F03.90

## 2021-06-03 HISTORY — DX: Type 2 diabetes mellitus without complications: E11.9

## 2021-06-03 HISTORY — DX: Rheumatic mitral stenosis: I05.0

## 2021-06-03 HISTORY — DX: Nonrheumatic aortic (valve) stenosis: I35.0

## 2021-06-03 HISTORY — DX: Chronic kidney disease, stage 3 unspecified: N18.30

## 2021-06-03 LAB — BRAIN NATRIURETIC PEPTIDE: B Natriuretic Peptide: 323.4 pg/mL — ABNORMAL HIGH (ref 0.0–100.0)

## 2021-06-03 LAB — CBC
HCT: 24.9 % — ABNORMAL LOW (ref 36.0–46.0)
Hemoglobin: 7.5 g/dL — ABNORMAL LOW (ref 12.0–15.0)
MCH: 24.8 pg — ABNORMAL LOW (ref 26.0–34.0)
MCHC: 30.1 g/dL (ref 30.0–36.0)
MCV: 82.5 fL (ref 80.0–100.0)
Platelets: 213 10*3/uL (ref 150–400)
RBC: 3.02 MIL/uL — ABNORMAL LOW (ref 3.87–5.11)
RDW: 16.5 % — ABNORMAL HIGH (ref 11.5–15.5)
WBC: 14.6 10*3/uL — ABNORMAL HIGH (ref 4.0–10.5)
nRBC: 0 % (ref 0.0–0.2)

## 2021-06-03 LAB — D-DIMER, QUANTITATIVE: D-Dimer, Quant: 2.02 ug/mL-FEU — ABNORMAL HIGH (ref 0.00–0.50)

## 2021-06-03 LAB — BASIC METABOLIC PANEL
Anion gap: 10 (ref 5–15)
BUN: 28 mg/dL — ABNORMAL HIGH (ref 8–23)
CO2: 25 mmol/L (ref 22–32)
Calcium: 7.8 mg/dL — ABNORMAL LOW (ref 8.9–10.3)
Chloride: 99 mmol/L (ref 98–111)
Creatinine, Ser: 1.39 mg/dL — ABNORMAL HIGH (ref 0.44–1.00)
GFR, Estimated: 39 mL/min — ABNORMAL LOW (ref >60)
Glucose, Bld: 120 mg/dL — ABNORMAL HIGH (ref 70–99)
Potassium: 3.7 mmol/L (ref 3.5–5.1)
Sodium: 134 mmol/L — ABNORMAL LOW (ref 135–145)

## 2021-06-03 LAB — PROCALCITONIN: Procalcitonin: 0.17 ng/mL

## 2021-06-03 LAB — CBG MONITORING, ED: Glucose-Capillary: 153 mg/dL — ABNORMAL HIGH (ref 70–99)

## 2021-06-03 LAB — TROPONIN I (HIGH SENSITIVITY)
Troponin I (High Sensitivity): 30 ng/L — ABNORMAL HIGH (ref <18)
Troponin I (High Sensitivity): 34 ng/L — ABNORMAL HIGH (ref <18)

## 2021-06-03 MED ORDER — INSULIN ASPART 100 UNIT/ML IJ SOLN
0.0000 [IU] | Freq: Every day | INTRAMUSCULAR | Status: DC
  Administered 2021-06-04 – 2021-06-06 (×2): 5 [IU] via SUBCUTANEOUS
  Filled 2021-06-03 (×2): qty 1

## 2021-06-03 MED ORDER — LORAZEPAM 1 MG PO TABS
0.5000 mg | ORAL_TABLET | Freq: Three times a day (TID) | ORAL | Status: DC | PRN
  Administered 2021-06-03 – 2021-06-07 (×8): 0.5 mg via ORAL
  Filled 2021-06-03 (×8): qty 1

## 2021-06-03 MED ORDER — ONDANSETRON HCL 4 MG/2ML IJ SOLN
4.0000 mg | Freq: Four times a day (QID) | INTRAMUSCULAR | Status: DC | PRN
  Administered 2021-06-04 – 2021-06-06 (×3): 4 mg via INTRAVENOUS
  Filled 2021-06-03 (×3): qty 2

## 2021-06-03 MED ORDER — TRAZODONE HCL 100 MG PO TABS
200.0000 mg | ORAL_TABLET | Freq: Every day | ORAL | Status: DC
  Administered 2021-06-04 – 2021-06-06 (×4): 200 mg via ORAL
  Filled 2021-06-03 (×4): qty 2

## 2021-06-03 MED ORDER — ACETAMINOPHEN 325 MG PO TABS
650.0000 mg | ORAL_TABLET | Freq: Four times a day (QID) | ORAL | Status: AC | PRN
  Administered 2021-06-04 – 2021-06-06 (×3): 650 mg via ORAL
  Filled 2021-06-03 (×3): qty 2

## 2021-06-03 MED ORDER — BENZONATATE 100 MG PO CAPS
200.0000 mg | ORAL_CAPSULE | Freq: Three times a day (TID) | ORAL | Status: DC | PRN
  Administered 2021-06-04 – 2021-06-07 (×3): 200 mg via ORAL
  Filled 2021-06-03 (×3): qty 2

## 2021-06-03 MED ORDER — INSULIN GLARGINE-YFGN 100 UNIT/ML ~~LOC~~ SOLN
100.0000 [IU] | Freq: Every day | SUBCUTANEOUS | Status: DC
  Administered 2021-06-04 – 2021-06-08 (×5): 100 [IU] via SUBCUTANEOUS
  Filled 2021-06-03 (×6): qty 1

## 2021-06-03 MED ORDER — CLOPIDOGREL BISULFATE 75 MG PO TABS
75.0000 mg | ORAL_TABLET | Freq: Every day | ORAL | Status: DC
  Administered 2021-06-04 – 2021-06-10 (×5): 75 mg via ORAL
  Filled 2021-06-03 (×6): qty 1

## 2021-06-03 MED ORDER — ALBUTEROL SULFATE (2.5 MG/3ML) 0.083% IN NEBU
2.5000 mg | INHALATION_SOLUTION | Freq: Four times a day (QID) | RESPIRATORY_TRACT | Status: DC | PRN
  Administered 2021-06-05 – 2021-06-12 (×6): 2.5 mg via RESPIRATORY_TRACT
  Filled 2021-06-03 (×7): qty 3

## 2021-06-03 MED ORDER — ISOSORBIDE MONONITRATE ER 30 MG PO TB24
30.0000 mg | ORAL_TABLET | Freq: Every day | ORAL | Status: DC
  Administered 2021-06-04 – 2021-06-06 (×3): 30 mg via ORAL
  Filled 2021-06-03 (×3): qty 1

## 2021-06-03 MED ORDER — FUROSEMIDE 10 MG/ML IJ SOLN
40.0000 mg | Freq: Once | INTRAMUSCULAR | Status: AC
  Administered 2021-06-03: 40 mg via INTRAVENOUS
  Filled 2021-06-03: qty 4

## 2021-06-03 MED ORDER — IOHEXOL 350 MG/ML SOLN
75.0000 mL | Freq: Once | INTRAVENOUS | Status: AC | PRN
  Administered 2021-06-03: 75 mL via INTRAVENOUS

## 2021-06-03 MED ORDER — BASAGLAR KWIKPEN 100 UNIT/ML ~~LOC~~ SOPN: 100.0000 [IU] | PEN_INJECTOR | Freq: Every day | SUBCUTANEOUS | Status: DC

## 2021-06-03 MED ORDER — FLUOXETINE HCL 20 MG PO CAPS
40.0000 mg | ORAL_CAPSULE | Freq: Two times a day (BID) | ORAL | Status: DC
  Administered 2021-06-04 – 2021-06-10 (×9): 40 mg via ORAL
  Filled 2021-06-03 (×14): qty 2

## 2021-06-03 MED ORDER — PANTOPRAZOLE SODIUM 40 MG PO TBEC
40.0000 mg | DELAYED_RELEASE_TABLET | Freq: Every day | ORAL | Status: DC
  Administered 2021-06-04 – 2021-06-06 (×3): 40 mg via ORAL
  Filled 2021-06-03 (×3): qty 1

## 2021-06-03 MED ORDER — ALBUTEROL SULFATE (2.5 MG/3ML) 0.083% IN NEBU
2.5000 mg | INHALATION_SOLUTION | Freq: Once | RESPIRATORY_TRACT | Status: AC
  Administered 2021-06-03: 2.5 mg via RESPIRATORY_TRACT
  Filled 2021-06-03: qty 3

## 2021-06-03 MED ORDER — IPRATROPIUM-ALBUTEROL 0.5-2.5 (3) MG/3ML IN SOLN
3.0000 mL | Freq: Three times a day (TID) | RESPIRATORY_TRACT | Status: AC
  Administered 2021-06-04 – 2021-06-05 (×6): 3 mL via RESPIRATORY_TRACT
  Filled 2021-06-03 (×6): qty 3

## 2021-06-03 MED ORDER — LOSARTAN POTASSIUM 50 MG PO TABS
25.0000 mg | ORAL_TABLET | Freq: Every day | ORAL | Status: DC
  Administered 2021-06-04 – 2021-06-06 (×3): 25 mg via ORAL
  Filled 2021-06-03 (×3): qty 1

## 2021-06-03 MED ORDER — ENOXAPARIN SODIUM 60 MG/0.6ML IJ SOSY
0.5000 mg/kg | PREFILLED_SYRINGE | INTRAMUSCULAR | Status: DC
  Administered 2021-06-04 – 2021-06-05 (×2): 47.5 mg via SUBCUTANEOUS
  Filled 2021-06-03 (×2): qty 0.6

## 2021-06-03 MED ORDER — LEVOTHYROXINE SODIUM 50 MCG PO TABS
125.0000 ug | ORAL_TABLET | Freq: Every day | ORAL | Status: DC
  Administered 2021-06-04 – 2021-06-10 (×5): 125 ug via ORAL
  Filled 2021-06-03 (×3): qty 1
  Filled 2021-06-03: qty 3
  Filled 2021-06-03 (×2): qty 1

## 2021-06-03 MED ORDER — NITROGLYCERIN 0.4 MG SL SUBL: 0.4000 mg | SUBLINGUAL_TABLET | SUBLINGUAL | Status: DC | PRN

## 2021-06-03 MED ORDER — ASPIRIN EC 81 MG PO TBEC
81.0000 mg | DELAYED_RELEASE_TABLET | Freq: Every day | ORAL | Status: DC
  Administered 2021-06-04 – 2021-06-05 (×2): 81 mg via ORAL
  Filled 2021-06-03 (×2): qty 1

## 2021-06-03 MED ORDER — INSULIN ASPART 100 UNIT/ML IJ SOLN
0.0000 [IU] | Freq: Three times a day (TID) | INTRAMUSCULAR | Status: DC
  Administered 2021-06-04: 8 [IU] via SUBCUTANEOUS
  Administered 2021-06-04 (×2): 5 [IU] via SUBCUTANEOUS
  Administered 2021-06-05: 3 [IU] via SUBCUTANEOUS
  Administered 2021-06-05 (×2): 5 [IU] via SUBCUTANEOUS
  Administered 2021-06-06: 8 [IU] via SUBCUTANEOUS
  Administered 2021-06-06 (×2): 2 [IU] via SUBCUTANEOUS
  Administered 2021-06-07: 5 [IU] via SUBCUTANEOUS
  Administered 2021-06-07 – 2021-06-09 (×3): 3 [IU] via SUBCUTANEOUS
  Administered 2021-06-09: 5 [IU] via SUBCUTANEOUS
  Filled 2021-06-03 (×14): qty 1

## 2021-06-03 MED ORDER — FUROSEMIDE 10 MG/ML IJ SOLN
40.0000 mg | Freq: Every day | INTRAMUSCULAR | Status: AC
  Administered 2021-06-04 – 2021-06-05 (×2): 40 mg via INTRAVENOUS
  Filled 2021-06-03 (×2): qty 4

## 2021-06-03 MED ORDER — IPRATROPIUM-ALBUTEROL 0.5-2.5 (3) MG/3ML IN SOLN
3.0000 mL | Freq: Once | RESPIRATORY_TRACT | Status: AC
  Administered 2021-06-03: 3 mL via RESPIRATORY_TRACT
  Filled 2021-06-03: qty 3

## 2021-06-03 MED ORDER — ENOXAPARIN SODIUM 40 MG/0.4ML IJ SOSY: 40.0000 mg | PREFILLED_SYRINGE | INTRAMUSCULAR | Status: DC

## 2021-06-03 MED ORDER — AZELASTINE HCL 0.1 % NA SOLN
1.0000 | Freq: Two times a day (BID) | NASAL | Status: DC
  Administered 2021-06-04 – 2021-06-11 (×13): 1 via NASAL
  Filled 2021-06-03 (×2): qty 30

## 2021-06-03 MED ORDER — ONDANSETRON HCL 4 MG PO TABS: 4.0000 mg | ORAL_TABLET | Freq: Four times a day (QID) | ORAL | Status: DC | PRN

## 2021-06-03 MED ORDER — LORAZEPAM 2 MG/ML IJ SOLN
INTRAMUSCULAR | Status: AC
  Filled 2021-06-03: qty 1

## 2021-06-03 MED ORDER — LORAZEPAM 2 MG/ML IJ SOLN
0.5000 mg | Freq: Once | INTRAMUSCULAR | Status: AC
  Administered 2021-06-03: 0.5 mg via INTRAVENOUS
  Filled 2021-06-03: qty 1

## 2021-06-03 MED ORDER — ACETAMINOPHEN 650 MG RE SUPP: 650.0000 mg | Freq: Four times a day (QID) | RECTAL | Status: AC | PRN

## 2021-06-03 MED ORDER — DULOXETINE HCL 20 MG PO CPEP
20.0000 mg | ORAL_CAPSULE | Freq: Every day | ORAL | Status: DC
  Administered 2021-06-04 – 2021-06-10 (×5): 20 mg via ORAL
  Filled 2021-06-03 (×9): qty 1

## 2021-06-03 NOTE — ED Notes (Signed)
Pt used call bell to try and notify primary RN that she is anxious due to SOB. Pt on home 3L via [place]. Recently had neb tx and lasix IV. Will give ativan once verified by pharm. Messaged pharm.

## 2021-06-03 NOTE — ED Triage Notes (Signed)
Pt to ED via [facility] from home for shortness of breath. Pt speaking in complete sentences stating that she cannot breath. Pts SpO2 92% at home on her normal 3 liters of O2. Pt did give pt some nebulized saline at 6 liter and her SpO2 came up to 99-100%. Pt has hx/o COPD.  Pt is extremely anxious upon arrival to ED. Asking for drink and stating that she cannot breath while speaking in complete sentences. No accessory muscle use noted.

## 2021-06-03 NOTE — ED Provider Notes (Signed)
[facility] Emergency Department Provider Note   ____________________________________________   Event Date/Time   First MD Initiated Contact with Patient 05/21/2021 1805     (approximate)  I have reviewed the triage vital signs and the nursing notes.   HISTORY  Chief Complaint Shortness of Breath  EM caveat: Dyspnea  HPI [name] is a 75 y.o. female reports shortness of breath worsening for 2 days.  Feels very anxious and short of breath  [place] about a week ago left leg slightly swollen thereafter  Recent heart surgery with valve replacement  No nausea vomiting.  No known fevers or chills.  Has had a cough and feels short of breath though.  Nonproductive.  Not any pain  Past Medical History:  Diagnosis Date   1st degree AV block    ACE-inhibitor cough    Allergic rhinitis    Anemia    iron deficiency anemia   Anxiety    Aortic ectasia (HCC)    a. CT abd in 12/2016 incidentally noted aortic atherosclerosis and infrarenal abdominal aortic ectasia measuring as large as 2.7 cm with recommendation to repeat US in 2023.   Arthritis    Asthma    Cataract    Chronic depression    Chronic diastolic CHF (congestive heart failure) (HCC)    Chronic headache    COPD (chronic obstructive pulmonary disease) (HCC)    Coronary artery disease    a. DES to RCA and mid Cx 2009. b. CABG and bioprosthetic AVR May 2015. c. cutting balloon to prox Cx in 05/2016   Diabetes mellitus    type 2   Diverticulitis of colon    Essential hypertension    GERD (gastroesophageal reflux disease)    Hearing loss    History of blood transfusion 2013   History of prosthetic aortic valve replacement    HOH (hard of hearing)    Hypercholesterolemia    intolerance of statins and niaspan   IDA (iron deficiency anemia) 02/03/2019   Mobitz type 1 second degree AV block    OSA (obstructive sleep apnea)    mild, intolerant of cpap   PAD (peripheral artery disease) ([place])     a. atherosclerosis by CT abd 12/2016 in [place].   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 Sumner Boast & Dr. 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   Acute on chronic diastolic (congestive) heart failure ([place]) 11/24/2020   SOB (shortness of breath) 11/24/2020   Acute hyponatremia 11/24/2020   Prolonged QT interval 11/24/2020   Angina pectoris ([place]) 11/03/2020   CHF exacerbation ([place]) 09/07/2020   Elevated troponin 09/07/2020   Chest pain 08/09/2020   Acute on chronic diastolic CHF (congestive heart failure) ([place]) 08/08/2020   Anemia due to stage 3a chronic kidney disease ([place]) 05/21/2019   IDA (iron deficiency anemia) 02/03/2019   COPD exacerbation ([place]) 12/28/2017   Depression, major, single episode, complete remission ([place]) 04/11/2017   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   Aortic ectasia, abdominal ([place]) 01/01/2017   GAD (generalized anxiety disorder) 09/26/2016   Acute posthemorrhagic anemia 08/24/2016   Acute respiratory failure with hypoxia ([place]) 08/24/2016  Acute diastolic CHF (congestive heart failure) (HCC) 08/24/2016   Hyponatremia 08/24/2016   Leukocytosis 08/24/2016   Fever 08/24/2016   Diarrhea 08/24/2016   Generalized weakness 08/24/2016   Acute diverticulitis 08/16/2016   Gastrointestinal hemorrhage    Ataxia 08/09/2016   Dizziness 08/09/2016   Staggering gait 08/09/2016   AVB (atrioventricular block)    Atypical chest pain    Unstable angina ([place]) 05/25/2016   Palpitations 05/25/2016   PUD - recent Rx for H.Pylori 05/25/2016   Stenosis of coronary artery stent    Recurrent major depressive disorder, in partial remission ([place]) 07/29/2015    Sialadenitis 04/25/2015   Viral URI 03/24/2015   Hypoglycemia 08/25/2014   Nausea without vomiting 08/25/2014   Dyslipidemia-statin ontol    Long-term insulin use ([place]) 03/31/2014   Aortic valve disorder 11/11/2013   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 10/12/2013   Coronary Artery Disease s/p CABG 10/2013 10/10/2013   Other malaise and fatigue 09/11/2013   Alopecia 03/06/2013   Insomnia 02/06/2013   Gastroesophageal reflux disease without esophagitis 01/19/2013   Dysphagia, unspecified(787.20) 01/19/2013   Dyspnea 11/17/2012   Weakness 11/17/2012   Abnormal MRI of head 09/05/2012   Depression with anxiety 03/21/2012   Extrinsic asthma 11/12/2011   Anemia 06/07/2011   Chronic obstructive pulmonary disease ([place]) 01/25/2010   Acquired hypothyroidism 08/10/2009   MICROSCOPIC HEMATURIA 05/07/2009   GOITER 03/02/2009   Essential hypertension 01/06/2009   MEMORY LOSS 06/23/2008   Obstructive sleep apnea 12/25/2007   DM type 2 (diabetes mellitus, type 2) ([place]) 09/09/2006   ALLERGIC RHINITIS 09/09/2006   DIVERTICULOSIS, COLON 09/09/2006    Past Surgical History:  Procedure Laterality Date   ABDOMINAL HYSTERECTOMY     ABDOMINAL HYSTERECTOMY W/ PARTIAL VAGINACTOMY     AORTIC VALVE REPLACEMENT N/A 10/12/2013   Procedure: AORTIC VALVE REPLACEMENT (AVR);  Surgeon: [name], MD;  Location: [place];  Service: Open Heart Surgery;  Laterality: N/A;   APPENDECTOMY  1964   BARTHOLIN GLAND CYST EXCISION     BLADDER SUSPENSION     BREAST BIOPSY Bilateral 09/11/2000   neg   BREAST BIOPSY Left 07/24/2010   neg   BREAST CYST EXCISION  1988   bilateral nonmalignant tumors, x3   CARDIAC CATHETERIZATION     CARDIAC CATHETERIZATION N/A 05/25/2016   Procedure: Coronary Balloon Angioplasty;  Surgeon: Leonie Man, MD;  Location: [place] [facility];  Service: Cardiovascular;  Laterality: N/A;   CARDIAC CATHETERIZATION N/A 05/25/2016   Procedure: Coronary/Graft Angiography;  Surgeon: Leonie Man, MD;   Location: [place] [facility];  Service: Cardiovascular;  Laterality: N/A;   CATARACT EXTRACTION W/ INTRAOCULAR LENS  IMPLANT, BILATERAL     CHOLECYSTECTOMY  2001   COLECTOMY     lap sigmoid   COLONOSCOPY  2014   polyps found, 2 clamped off.   CORONARY ANGIOPLASTY  10/29/2007   Prox RCA & Mid Cx.   CORONARY ARTERY BYPASS GRAFT N/A 10/12/2013   Procedure: CORONARY ARTERY BYPASS GRAFT TIMES TWO;  Surgeon: [name], MD;  Location: [facility];  Service: Open Heart Surgery;  Laterality: N/A;   CORONARY/GRAFT ANGIOGRAPHY N/A 09/20/2017   Procedure: CORONARY/GRAFT ANGIOGRAPHY;  Surgeon: Sherren Mocha, MD;  Location: [place] [facility];  Service: Cardiovascular;  Laterality: N/A;   INTRAOPERATIVE TRANSTHORACIC ECHOCARDIOGRAM N/A 04/18/2021   Procedure: INTRAOPERATIVE TRANSTHORACIC ECHOCARDIOGRAM;  Surgeon: [name], MD;  Location: [place] [facility];  Service: Open Heart Surgery;  Laterality: N/A;   LEFT HEART CATHETERIZATION WITH CORONARY  ANGIOGRAM N/A 10/09/2013   Procedure: LEFT HEART CATHETERIZATION WITH CORONARY ANGIOGRAM;  Surgeon: [name], MD;  Location: [facility];  Service: Cardiovascular;  Laterality: N/A;   RIGHT/LEFT HEART CATH AND CORONARY ANGIOGRAPHY N/A 11/03/2020   Procedure: RIGHT/LEFT HEART CATH AND CORONARY ANGIOGRAPHY;  Surgeon: Corey Skains, MD;  Location: [place] [facility];  Service: Cardiovascular;  Laterality: N/A;   RIGHT/LEFT HEART CATH AND CORONARY/GRAFT ANGIOGRAPHY N/A 02/10/2021   Procedure: RIGHT/LEFT HEART CATH AND CORONARY/GRAFT ANGIOGRAPHY;  Surgeon: Andrez Grime, MD;  Location: [place] [facility];  Service: Cardiovascular;  Laterality: N/A;   STERNAL WIRES REMOVAL N/A 04/13/2014   Procedure: STERNAL WIRES REMOVAL;  Surgeon: [name], MD;  Location: [place] [facility];  Service: Thoracic;  Laterality: N/A;   TEE WITHOUT CARDIOVERSION N/A 11/03/2020   Procedure: TRANSESOPHAGEAL ECHOCARDIOGRAM (TEE);  Surgeon: Corey Skains, MD;   Location: [facility];  Service: Cardiovascular;  Laterality: N/A;   TEE WITHOUT CARDIOVERSION N/A 02/13/2021   Procedure: TRANSESOPHAGEAL ECHOCARDIOGRAM (TEE);  Surgeon: Corey Skains, MD;  Location: [facility];  Service: Cardiovascular;  Laterality: N/A;   THYROIDECTOMY     TONSILLECTOMY     TRANSCATHETER AORTIC VALVE REPLACEMENT, TRANSFEMORAL N/A 04/18/2021   Procedure: TRANSCATHETER AORTIC VALVE REPLACEMENT, TRANSFEMORAL;  Surgeon: [name], MD;  Location: [place] [facility];  Service: Open Heart Surgery;  Laterality: N/A;   TUBAL LIGATION     VAGINAL DELIVERY     3   VISCERAL ARTERY INTERVENTION N/A 08/16/2016   Procedure: Visceral Artery Intervention;  Surgeon: Algernon Huxley, MD;  Location: [place] [facility];  Service: Cardiovascular;  Laterality: N/A;    Prior to Admission medications   Medication Sig Start Date End Date Taking? Authorizing Provider  acetaminophen (TYLENOL) 500 MG tablet Take 500-1,000 mg by mouth every 6 (six) hours as needed for mild pain.    [provider]  albuterol (PROVENTIL) (2.5 MG/3ML) 0.083% nebulizer solution Take 2.5 mg by nebulization every 6 (six) hours as needed for wheezing or shortness of breath. 06/07/20   [provider]  ANORO ELLIPTA 62.5-25 MCG/INH AEPB Inhale 1 puff into the lungs daily as needed (SOB). 10/11/20   [provider]  aspirin EC 81 MG EC tablet Take 1 tablet (81 mg total) by mouth daily. Swallow whole. 08/10/20   Samuella Cota, MD  azelastine (ASTELIN) 0.1 % nasal spray Place 1 spray into both nostrils 2 (two) times daily. 05/10/21   [provider]  budesonide (PULMICORT) 1 MG/2ML nebulizer solution Inhale 2 mLs into the lungs at bedtime. 02/15/21 02/15/22  [provider]  clopidogrel (PLAVIX) 75 MG tablet Take 75 mg by mouth daily. 11/15/20   [provider]  DULoxetine (CYMBALTA) 20 MG capsule Take 1 capsule (20 mg total) by mouth in the morning. 11/26/20   Jennye Boroughs, MD  FLUoxetine (PROZAC) 40 MG capsule Take 40 mg by mouth 2 (two) times daily. 08/23/20   [provider]  glipiZIDE (GLUCOTROL XL) 10 MG 24 hr tablet Take 10 mg by mouth daily. 05/02/21   [provider]  Insulin Glargine (BASAGLAR KWIKPEN) 100 UNIT/ML Inject 100 Units into the skin at bedtime.    [provider]  insulin lispro (HUMALOG) 100 UNIT/ML injection Inject 36-66 Units into the skin 3 (three) times daily before meals.    [provider]  isosorbide mononitrate (IMDUR) 30 MG 24 hr tablet Take 1 tablet (30 mg total) by mouth daily. 11/03/20 11/03/21  Corey Skains, MD  levothyroxine (SYNTHROID, LEVOTHROID) 125 MCG tablet Take 125 mcg by mouth daily before breakfast. 04/03/17   [provider]  LORazepam (ATIVAN) 0.5 MG tablet Take 0.5 mg by mouth daily. Patient not taking: Reported on 05/17/2021 04/05/21   [provider]  losartan (COZAAR) 25 MG tablet Take 1 tablet (25 mg total) by mouth in the morning. 11/26/20   Jennye Boroughs, MD  nitroGLYCERIN (NITROSTAT) 0.4 MG SL tablet Place 1 tablet (0.4 mg total) under the tongue every 5 (five) minutes as needed for chest pain. 03/06/21   [name], MD  ondansetron (ZOFRAN-ODT) 4 MG disintegrating tablet Take 4 mg by mouth 3 (three) times daily as needed for nausea or vomiting. 02/14/21   [provider]  ONETOUCH ULTRA test strip USE 1 STRIP TO [place] DAILY 05/10/21   [provider]  pantoprazole (PROTONIX) 40 MG tablet Take 40 mg by mouth in the morning.    [provider]  promethazine (PHENERGAN) 25 MG tablet Take 1 tablet by mouth 3 (three) times daily as needed. 02/15/21   [provider]  torsemide (DEMADEX) 20 MG tablet Take 20 mg by mouth daily. 12/08/20   [provider]  traZODone (DESYREL) 100 MG tablet Take 200 mg by mouth at bedtime.    [provider]    Allergies Amitriptyline, Benadryl  [diphenhydramine], Demerol [meperidine], Gabapentin, Mirtazapine, Olanzapine, Voltaren [diclofenac sodium], Zetia [ezetimibe], Ativan [lorazepam], Atorvastatin, Budesonide-formoterol fumarate, Bupropion hcl, Caffeine, Codeine sulfate, Lisinopril, Metformin, Mometasone furoate, Morphine sulfate, Other, Oxycodone-acetaminophen, Pioglitazone, Propoxyphene n-acetaminophen, Rosuvastatin, Shellfish allergy, Suvorexant, Ticagrelor, Tramadol, Venlafaxine, Xanax [alprazolam], Zolpidem tartrate, and Latex  Family History  Problem Relation Age of Onset   Breast cancer Mother 20   Hypertension Father    Mesothelioma Father    Asthma Father    Stroke Paternal Grandfather    Heart disease Other    Breast cancer Maternal Aunt    Breast cancer Paternal Aunt     Social History Social History   Tobacco Use   Smoking status: Former    Packs/day: 0.50    Years: 30.00    Pack years: 15.00    Types: Cigarettes    Quit date: 10/02/2013    Years since quitting: 7.6   Smokeless tobacco: Never  Vaping Use   Vaping Use: Never used  Substance Use Topics   Alcohol use: No   Drug use: No    Review of Systems Denies fever See HPI EM caveat   ____________________________________________   PHYSICAL EXAM:  VITAL SIGNS: ED Triage Vitals  Enc Vitals Group     BP 05/06/2021 1804 134/79     Pulse Rate 05/20/2021 1804 93     Resp 06/02/2021 1804 (!) 22     Temp 05/29/2021 1807 98.4 F (36.9 C)     Temp Source 05/23/2021 1807 Oral     SpO2 05/27/2021 1804 94 %     Weight --      Height --      Head Circumference --      Peak Flow --      Pain Score 05/09/2021 1804 6     Pain Loc --      Pain Edu? --      Excl. in [place]? --     Constitutional: Alert and oriented.  Appears anxious and moderately dyspneic with tachypnea and mild accessory muscle use Eyes: Conjunctivae are normal. Head: Atraumatic. Nose: No congestion/rhinnorhea. Mouth/Throat: Mucous membranes are moist.  Neck: No stridor.  Cardiovascular:  Normal rate, regular rhythm. Grossly normal heart sounds.  Good peripheral circulation. Respiratory: Diminished lung sounds in the bases bilaterally.  Scant end expiratory wheezing to noted.  No notable rales.  Mild accessory muscle use and tachypnea.  Speaks in phrases reports she is very anxious.  Frequently states "just asked my husband". Gastrointestinal: Soft and nontender. No distention. Musculoskeletal: No lower extremity tenderness nor edema right lower extremity, left lower extremity demonstrates good palpable distal pulses but some contusion and bruising over the left anterior knee and slight bruising over the left calf area and mild edema from approximately the tibial plateau down to the level of the left ankle without obvious venous cords or congestion. Neurologic:  Normal speech and language except seems very anxious. No gross focal neurologic deficits are appreciated.  Skin:  Skin is warm, dry and intact. No rash noted. Psychiatric: Mood and affect are normal. Speech and behavior are normal.  ____________________________________________   LABS (all labs ordered are listed, but only abnormal results are displayed)  Labs Reviewed  BASIC METABOLIC PANEL - Abnormal; Notable for the following components:      Result Value   Sodium 134 (*)    Glucose, Bld 120 (*)    BUN 28 (*)    Creatinine, Ser 1.39 (*)    Calcium 7.8 (*)    GFR, Estimated 39 (*)    All other components within normal limits  CBC - Abnormal; Notable for the following components:   WBC 14.6 (*)    RBC 3.02 (*)    Hemoglobin 7.5 (*)    HCT 24.9 (*)    MCH 24.8 (*)    RDW 16.5 (*)    All other components within normal limits  BRAIN NATRIURETIC PEPTIDE - Abnormal; Notable for the following components:   B Natriuretic Peptide 323.4 (*)    All other components within normal limits  TROPONIN I (HIGH SENSITIVITY) - Abnormal; Notable for the following components:   Troponin I (High Sensitivity) 30 (*)    All other  components within normal limits  RESP PANEL BY RT-PCR (FLU A&B, COVID) ARPGX2  PROCALCITONIN  TROPONIN I (HIGH SENSITIVITY)   ____________________________________________  EKG  Reviewed inter by me at 1805 Heart rate 95 QRS 100 QTc 480 Atrial fibrillation, mild nonspecific T wave abnormality  Compared with previous EKG P waves are not as apparent.  Question if patient may have new onset atrial fibrillation, or alternatively the P waves are not readily noted on today's EKG.  Possibly representative of new A. fib ____________________________________________  RADIOLOGY  US Venous Img Lower Unilateral Left  Result Date: 05/27/2021 CLINICAL DATA:  Initial evaluation for acute swelling of left calf, evaluate for DVT. EXAM: Left LOWER EXTREMITY VENOUS DOPPLER ULTRASOUND TECHNIQUE: Gray-scale sonography with graded compression, as well as color Doppler and duplex ultrasound were performed to evaluate the lower extremity deep venous systems from the level of the common femoral vein and including the common femoral, femoral, profunda femoral, popliteal and calf veins including the posterior tibial, peroneal and gastrocnemius veins when visible. The superficial great saphenous vein was also interrogated. Spectral Doppler was utilized to evaluate flow at rest and with distal augmentation maneuvers in the common femoral, femoral and popliteal veins. COMPARISON:  None. FINDINGS: Contralateral Common Femoral Vein: Respiratory phasicity is normal and symmetric with the symptomatic side. No evidence of thrombus. Normal compressibility. Common Femoral Vein: No evidence of thrombus. Normal compressibility, respiratory phasicity and response to augmentation. Saphenofemoral Junction:  No evidence of thrombus. Normal compressibility and flow on color Doppler imaging. Profunda Femoral Vein: No evidence of thrombus. Normal compressibility and flow on color Doppler imaging. Femoral Vein: No evidence of thrombus. Normal  compressibility, respiratory phasicity and response to augmentation. Popliteal Vein: No evidence of thrombus. Normal compressibility, respiratory phasicity and response to augmentation. Calf Veins: No evidence of thrombus. Normal compressibility and flow on color Doppler imaging. Superficial Great Saphenous Vein: No evidence of thrombus. Normal compressibility. Venous Reflux:  None. Other Findings:  None. IMPRESSION: No evidence of deep venous thrombosis. Electronically Signed   By: Jeannine Boga M.D.   On: 05/04/2021 19:41   DG Chest Port 1 View  Result Date: 05/08/2021 CLINICAL DATA:  Pt to ED via [facility] from home for shortness of breath. Pt speaking in complete sentences stating that she cannot breath. EXAM: PORTABLE CHEST 1 VIEW COMPARISON:  05/21/2021 FINDINGS: Stable changes from prior cardiac surgery and aortic valve replacement. Cardiac silhouette is enlarged. No mediastinal or hilar masses. Bilateral interstitial and patchy hazy airspace lung opacities. Interstitial opacities are similar to the prior exam, airspace opacities appear new. No convincing pleural effusion.  No pneumothorax. Skeletal structures are grossly intact. IMPRESSION: 1. Interstitial and hazy airspace lung opacities. Congestive heart failure with pulmonary edema suspected. Multifocal pneumonia is also in the differential diagnosis. Electronically Signed   By: Lajean Manes M.D.   On: 05/06/2021 18:42    Imaging reviewed, left lower extremity negative for DVT.  Chest x-ray hazy opacities suggestive of possible CHF though multifocal pneumonia also considered ____________________________________________   PROCEDURES  Procedure(s) performed: None  Procedures  Critical Care performed: No  ____________________________________________   INITIAL IMPRESSION / ASSESSMENT AND PLAN / ED COURSE  Pertinent labs & imaging results that were available during my care of the patient were reviewed by me and considered in my  medical decision making (see chart for details).   Patient presents with shortness of breath.  Reports dyspnea increasing for the last 2 to 3 days.  Slight edema to left lower extremity favored secondary to her fall onto her left knee which she was evaluated for previously.  Ultrasound negative for DVT.  She has mild oxygen requirement uses 2 to 3 L of oxygen at home currently on 4 L with saturation mid 90s  Notably tachypneic and very anxious.  After receiving Ativan this has improved her work of breathing is improved.  However in review of her history she has had a recent valve replacement, chest x-ray is questionable for pulmonary edema or potentially infectious etiologies.  She does not elevated white count but also does have an elevated BNP.  She is currently after husband arrived, he is informing that she is currently on Levaquin which she has been taking for the last couple days  No associated chest pain.  ----------------------------------------- 8:31 PM on 05/26/2021 ----------------------------------------- Discussed with Dr. Tobie Poet.  Patient does show improvement with current treatments but mild ongoing wheezing.  She was given previous steroid intramuscular injection and is currently on Levaquin therapy.  Based on the clinical history though in my evaluation I question if this may be secondary to pulmonary edema, and we will give her a small Lasix challenge IV to assess for improvement.  Pneumonia cannot be excluded at this time, but is currently on Levaquin, received intramuscular steroid, and I will continue her on albuterol.       ____________________________________________   FINAL CLINICAL IMPRESSION(S) / ED DIAGNOSES  Final diagnoses:  SOB (shortness of breath)  Note:  This document was prepared using Systems analyst and may include unintentional dictation errors       Delman Kitten, MD 05/23/2021 2032

## 2021-06-03 NOTE — ED Notes (Signed)
ED Provider at bedside. 

## 2021-06-03 NOTE — Progress Notes (Signed)
PHARMACIST - PHYSICIAN COMMUNICATION  CONCERNING:  Enoxaparin (Lovenox) for DVT Prophylaxis    RECOMMENDATION: Patient was prescribed enoxaprin 40mg  q24 hours for VTE prophylaxis.   There were no vitals filed for this visit.  There is no height or weight on file to calculate BMI.  Estimated Creatinine Clearance: 40 mL/min (A) (by C-G formula based on SCr of 1.39 mg/dL (H)).   Based on [place] patient is candidate for enoxaparin 0.5mg /kg TBW SQ every 24 hours based on BMI being >30.  DESCRIPTION: Pharmacy has adjusted enoxaparin dose per [facility] policy.  Patient is now receiving enoxaparin 0.5 mg/kg every 24 hours   Renda Rolls, PharmD, [facility] 05/19/2021 10:53 PM

## 2021-06-03 NOTE — ED Notes (Signed)
Patient transported to CT 

## 2021-06-03 NOTE — H&P (Addendum)
History and Physical   MARLETTA BOUSQUET ENI:[id number] DOB: 12-21-1945 DOA: 05/04/2021  PCP: Rusty Aus, MD  Outpatient Specialists: Dr. Lanney Gins, pulmonology Patient coming from: Home via EMS  I have personally briefly reviewed patient's old medical records in [place].  Chief Concern: Shortness of breath  HPI: SIRIYAH AMBROSIUS is a 75 y.o. 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  She presents emergency department for chief concerns of shortness of breath.  At bedside, she is able to tell me her name, age, and the current year of 72. She knows she is in the  hospital and is able to fully participate in this HPI encounter.  She states the worsening shortness of breath has been really bad in the last two days. She reports that before she saw Dr. Sabra Heck, she feel on her right knee and now it is all bruised up.   She denies any fever, dysphagia, vomiting, chest pain, abdominal pain, dysuria, hematuria, diarrhea. She does not know if any weight changes. She denies trauma to her person.   She endorses mouth soreness on the left side of her tongue and nausea. She reports the breathing treatment minimally improved her symptoms.  She reports the antibiotic and treatments from outpatient provider did not help.   Social history: She lives at home with her husband. She is a former tobacco user, quitting about 1.5 years ago.  At her peak she smoked half a pack per  day.  She denies EtOH and recreational drug use.  She is retired and formerly worked in [employer] very [employer]  Vaccination history: Patient is vaccinated for COVID-19 with at least 2 doses.  ROS: Constitutional: no weight change, no fever ENT/Mouth: no sore throat, no rhinorrhea Eyes: no eye pain, no vision changes Cardiovascular: no chest pain, no dyspnea,  no edema, no palpitations Respiratory: no cough, no sputum, no wheezing Gastrointestinal: no nausea, no vomiting, no diarrhea, no constipation Genitourinary: no urinary incontinence, no dysuria, no hematuria Musculoskeletal: no arthralgias, no myalgias Skin: no skin lesions, no pruritus, Neuro: + weakness, no loss of consciousness, no syncope Psych: no anxiety, no depression, + decrease appetite Heme/Lymph: no bruising, no bleeding  ED Course: Discussed with emergency medicine provider, patient requiring hospitalization for chief concerns of dyspnea on exertion presumed secondary to COPD versus heart failure exacerbation.  Vitals in the emergency department showed temperature of 98.4, respiration rate of 20, heart rate of 93, initial blood pressure 134/79, SPO2 of 91% on 2 L nasal cannula.  Serum sodium 134, potassium 3.7, chloride 99, bicarb 25, BUN 28, serum creatinine of 1.39, nonfasting blood glucose 120, GFR 39, WBC elevated at 14.6, hemoglobin 7.5, platelets of 213.  High sensitive troponin was 30.  COVID/influenza A/influenza B PCR have been collected and pending result.  In the emergency department patient was given albuterol 2.5 nebulizers, duo nebs x1, Ativan 0.5 mg IV, furosemide 40 mg IV once ordered.  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)   Shortness of breath   # Dyspnea on exertion-etiology work-up in progress - In setting of recent TAVR on 04/20/2021, valvular failure cannot be excluded at this time - There was an echo done on 05/17/2021, however on review of chest x-ray on day of admission, there is significant bilateral interstitial and hazy airspace lung opacities with pulmonary edema appreciated on my review - Check procalcitonin  - Complete echo ordered, pending echo, patient would benefit from cardiology evaluation and possible transfer to [facility] in [place] - D-dimer elevated, CTA of the chest to assess for pulmonary embolism ordered - CTA of the chest showed extensive multifocal pulmonary infiltrate consistent with atypical infection, given patient presentation and CT imaging, I have ordered azithromycin and ceftriaxone 2 g - MRSA PCR has been ordered, if positive would recommend a.m. team to initiate MRSA coverage with vancomycin IV  # Left knee ecchymosis-present on admission  # Insulin-dependent diabetes mellitus type 2-resumed home long-acting insulin 100 units nightly - Insulin SSI with at bedtime coverage ordered  # Hyperlipidemia # Depression # Generalized anxiety disorder # Anxiety-resume home duloxetine 20 mg every morning, fluoxetine 40 mg twice daily - Ativan 0.5 mg every 8 hours as needed for anxiety  # Insomnia-resumed home trazodone 200 mg nightly # Hypothyroid-resumed home levothyroxine 125 mcg daily # GERD-PPI # History of COVID-19 infection-patient tested positive on 06/04/2020  Chart reviewed.   05/31/2021 outpatient clinic visit for COPD exacerbation: Patient was  prescribed Levaquin 500 mg daily for 7 days, Kenalog 60 mg IM one-time dose in the clinic.  Patient was advised to continue nebulizers.  Complete echo on 05/17/2021 was read as estimated ejection fraction 55 to 78%, grade 1 diastolic dysfunction.  DVT prophylaxis: Enoxaparin 40 mg subcutaneous every 24 hours Code Status: full code  Diet: Heart healthy/carb modified Family Communication: no Disposition Plan: Pending clinical course, pending echo Consults called: None at this time Admission status: Telemetry medical, observation  Past Medical History:  Diagnosis Date   1st degree AV block    ACE-inhibitor cough    Allergic rhinitis    Anemia    iron deficiency anemia   Anxiety    Aortic ectasia ([place])    a. CT abd in 12/2016 incidentally noted aortic atherosclerosis and infrarenal abdominal aortic ectasia measuring as large as 2.7 cm with recommendation to repeat US in 2023.   Arthritis    Asthma    Cataract    Chronic depression    Chronic diastolic CHF (congestive heart failure) (HCC)    Chronic headache    COPD (chronic obstructive pulmonary disease) (HCC)    Coronary artery disease    a. DES to RCA and mid Cx 2009. b. CABG and bioprosthetic AVR May 2015. c. cutting balloon to prox Cx in 05/2016   Diabetes mellitus    type 2   Diverticulitis of colon    Essential hypertension    GERD (gastroesophageal reflux disease)  Hearing loss    History of blood transfusion 2013   History of prosthetic aortic valve replacement    HOH (hard of hearing)    Hypercholesterolemia    intolerance of statins and niaspan   IDA (iron deficiency anemia) 02/03/2019   Mobitz type 1 second degree AV block    OSA (obstructive sleep apnea)    mild, intolerant of cpap   PAD (peripheral artery disease) ([place])    a. atherosclerosis by CT abd 12/2016 in [place].   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 Sumner Boast & Dr. Cyndia Bent   Statin intolerance    Thyroid disease    Past Surgical History:  Procedure Laterality Date   ABDOMINAL HYSTERECTOMY     ABDOMINAL HYSTERECTOMY W/ PARTIAL VAGINACTOMY     AORTIC VALVE REPLACEMENT N/A 10/12/2013   Procedure: AORTIC VALVE REPLACEMENT (AVR);  Surgeon: [name], MD;  Location: [facility];  Service: Open Heart Surgery;  Laterality: N/A;   APPENDECTOMY  1964   BARTHOLIN GLAND CYST EXCISION     BLADDER SUSPENSION     BREAST BIOPSY Bilateral 09/11/2000   neg   BREAST BIOPSY Left 07/24/2010   neg   BREAST CYST EXCISION  1988   bilateral nonmalignant tumors, x3   CARDIAC CATHETERIZATION     CARDIAC CATHETERIZATION N/A 05/25/2016   Procedure: Coronary Balloon Angioplasty;  Surgeon: Leonie Man, MD;  Location: [facility];  Service: Cardiovascular;  Laterality: N/A;   CARDIAC CATHETERIZATION N/A 05/25/2016   Procedure: Coronary/Graft Angiography;  Surgeon: Leonie Man, MD;  Location: [facility];  Service: Cardiovascular;  Laterality: N/A;   CATARACT EXTRACTION W/ INTRAOCULAR LENS  IMPLANT, BILATERAL     CHOLECYSTECTOMY  2001   COLECTOMY     lap sigmoid   COLONOSCOPY  2014   polyps found, 2 clamped off.   CORONARY ANGIOPLASTY  10/29/2007   Prox RCA & Mid Cx.   CORONARY ARTERY BYPASS GRAFT N/A 10/12/2013   Procedure: CORONARY ARTERY BYPASS GRAFT TIMES TWO;  Surgeon: [name], MD;  Location: [facility];  Service: Open Heart Surgery;  Laterality: N/A;   CORONARY/GRAFT ANGIOGRAPHY N/A 09/20/2017   Procedure: CORONARY/GRAFT ANGIOGRAPHY;  Surgeon: Sherren Mocha, MD;  Location: [facility];  Service: Cardiovascular;  Laterality: N/A;   INTRAOPERATIVE TRANSTHORACIC ECHOCARDIOGRAM N/A 04/18/2021   Procedure: INTRAOPERATIVE TRANSTHORACIC ECHOCARDIOGRAM;  Surgeon: [name], MD;  Location: [facility];  Service: Open Heart Surgery;  Laterality: N/A;   LEFT HEART  CATHETERIZATION WITH CORONARY ANGIOGRAM N/A 10/09/2013   Procedure: LEFT HEART CATHETERIZATION WITH CORONARY ANGIOGRAM;  Surgeon: [name], MD;  Location: [facility];  Service: Cardiovascular;  Laterality: N/A;   RIGHT/LEFT HEART CATH AND CORONARY ANGIOGRAPHY N/A 11/03/2020   Procedure: RIGHT/LEFT HEART CATH AND CORONARY ANGIOGRAPHY;  Surgeon: Corey Skains, MD;  Location: [facility];  Service: Cardiovascular;  Laterality: N/A;   RIGHT/LEFT HEART CATH AND CORONARY/GRAFT ANGIOGRAPHY N/A 02/10/2021   Procedure: RIGHT/LEFT HEART CATH AND CORONARY/GRAFT ANGIOGRAPHY;  Surgeon: Andrez Grime, MD;  Location: [facility];  Service: Cardiovascular;  Laterality: N/A;   STERNAL WIRES REMOVAL N/A 04/13/2014   Procedure: STERNAL WIRES REMOVAL;  Surgeon: [name], MD;  Location: [facility];  Service: Thoracic;  Laterality: N/A;   TEE WITHOUT CARDIOVERSION N/A  11/03/2020   Procedure: TRANSESOPHAGEAL ECHOCARDIOGRAM (TEE);  Surgeon: Corey Skains, MD;  Location: [facility];  Service: Cardiovascular;  Laterality: N/A;   TEE WITHOUT CARDIOVERSION N/A 02/13/2021   Procedure: TRANSESOPHAGEAL ECHOCARDIOGRAM (TEE);  Surgeon: Corey Skains, MD;  Location: [facility];  Service: Cardiovascular;  Laterality: N/A;   THYROIDECTOMY     TONSILLECTOMY     TRANSCATHETER AORTIC VALVE REPLACEMENT, TRANSFEMORAL N/A 04/18/2021   Procedure: TRANSCATHETER AORTIC VALVE REPLACEMENT, TRANSFEMORAL;  Surgeon: [name], MD;  Location: [facility];  Service: Open Heart Surgery;  Laterality: N/A;   TUBAL LIGATION     VAGINAL DELIVERY     3   VISCERAL ARTERY INTERVENTION N/A 08/16/2016   Procedure: Visceral Artery Intervention;  Surgeon: Algernon Huxley, MD;  Location: [facility];  Service: Cardiovascular;  Laterality: N/A;   Social History:  reports that she quit smoking about 7 years ago. Her smoking use included cigarettes. She has a 15.00 pack-year smoking history. She  has never used smokeless tobacco. She reports that she does not drink alcohol and does not use drugs.  Allergies  Allergen Reactions   Amitriptyline Other (See Comments)    Unknown reaction   Benadryl [Diphenhydramine] Shortness Of Breath   Demerol [Meperidine] Other (See Comments)    Unknown reaction   Gabapentin Other (See Comments)    Unknown reaction   Mirtazapine Other (See Comments)    Unknown reaction   Olanzapine Other (See Comments)    Unknown reaction    Voltaren [Diclofenac Sodium] Shortness Of Breath   Zetia [Ezetimibe] Other (See Comments)    Weakness in legs, shakiness all over   Ativan [Lorazepam] Other (See Comments)    Causes double vision at highter than .5 mg dose   Atorvastatin Other (See Comments)    Muscle aches and weakness   Budesonide-Formoterol Fumarate Other (See Comments)    Shakiness, tremors   Bupropion Hcl Other (See Comments)    "cloud over me" depression   Caffeine Other (See Comments)    jitters   Codeine Sulfate Other (See Comments)    Makes chest hurt like a heart attack   Lisinopril Cough   Metformin Nausea And Vomiting   Mometasone Furoate Nausea And Vomiting   Morphine Sulfate Other (See Comments)    Chest pain like a heart attack   Other Other (See Comments)    Beta Blockers, reaction shortness of breath   Oxycodone-Acetaminophen Nausea And Vomiting   Pioglitazone Other (See Comments)    Cannot take because of risk of bladder cancer   Propoxyphene N-Acetaminophen Nausea And Vomiting   Rosuvastatin Other (See Comments)    Muscle aches and weakness   Shellfish Allergy Diarrhea   Suvorexant Other (See Comments)    Jerking/nervous    Ticagrelor     Other reaction(s): Other (See Comments) "slowed heart rate" & chest pain   Tramadol Nausea Only   Venlafaxine Other (See Comments)    Unknown reaction   Xanax [Alprazolam] Other (See Comments)    Mental status changes, "didn't know where she was at" per husband at bedside   Zolpidem  Tartrate Other (See Comments)     Jittery, diarrhea   Latex Rash   Family History  Problem Relation Age of Onset   Breast cancer Mother 37   Hypertension Father    Mesothelioma Father    Asthma Father    Stroke Paternal Grandfather    Heart disease Other    Breast cancer Maternal Aunt  Breast cancer Paternal Aunt    Family history: Family history reviewed and pertinent for hypertension in father.   Prior to Admission medications   Medication Sig Start Date End Date Taking? Authorizing Provider  acetaminophen (TYLENOL) 500 MG tablet Take 500-1,000 mg by mouth every 6 (six) hours as needed for mild pain.    [provider]  albuterol (PROVENTIL) (2.5 MG/3ML) 0.083% nebulizer solution Take 2.5 mg by nebulization every 6 (six) hours as needed for wheezing or shortness of breath. 06/07/20   [provider]  ANORO ELLIPTA 62.5-25 MCG/INH AEPB Inhale 1 puff into the lungs daily as needed (SOB). 10/11/20   [provider]  aspirin EC 81 MG EC tablet Take 1 tablet (81 mg total) by mouth daily. Swallow whole. 08/10/20   Samuella Cota, MD  azelastine (ASTELIN) 0.1 % nasal spray Place 1 spray into both nostrils 2 (two) times daily. 05/10/21   [provider]  budesonide (PULMICORT) 1 MG/2ML nebulizer solution Inhale 2 mLs into the lungs at bedtime. 02/15/21 02/15/22  [provider]  clopidogrel (PLAVIX) 75 MG tablet Take 75 mg by mouth daily. 11/15/20   [provider]  DULoxetine (CYMBALTA) 20 MG capsule Take 1 capsule (20 mg total) by mouth in the morning. 11/26/20   Jennye Boroughs, MD  FLUoxetine (PROZAC) 40 MG capsule Take 40 mg by mouth 2 (two) times daily. 08/23/20   [provider]  glipiZIDE (GLUCOTROL XL) 10 MG 24 hr tablet Take 10 mg by mouth daily. 05/02/21   [provider]  Insulin Glargine (BASAGLAR KWIKPEN) 100 UNIT/ML Inject 100 Units into the skin at bedtime.    [provider]  insulin lispro (HUMALOG)  100 UNIT/ML injection Inject 36-66 Units into the skin 3 (three) times daily before meals.    [provider]  isosorbide mononitrate (IMDUR) 30 MG 24 hr tablet Take 1 tablet (30 mg total) by mouth daily. 11/03/20 11/03/21  Corey Skains, MD  levothyroxine (SYNTHROID, LEVOTHROID) 125 MCG tablet Take 125 mcg by mouth daily before breakfast. 04/03/17   [provider]  LORazepam (ATIVAN) 0.5 MG tablet Take 0.5 mg by mouth daily. Patient not taking: Reported on 05/17/2021 04/05/21   [provider]  losartan (COZAAR) 25 MG tablet Take 1 tablet (25 mg total) by mouth in the morning. 11/26/20   Jennye Boroughs, MD  nitroGLYCERIN (NITROSTAT) 0.4 MG SL tablet Place 1 tablet (0.4 mg total) under the tongue every 5 (five) minutes as needed for chest pain. 03/06/21   [name], MD  ondansetron (ZOFRAN-ODT) 4 MG disintegrating tablet Take 4 mg by mouth 3 (three) times daily as needed for nausea or vomiting. 02/14/21   [provider]  ONETOUCH ULTRA test strip USE 1 STRIP TO [place] DAILY 05/10/21   [provider]  pantoprazole (PROTONIX) 40 MG tablet Take 40 mg by mouth in the morning.    [provider]  promethazine (PHENERGAN) 25 MG tablet Take 1 tablet by mouth 3 (three) times daily as needed. 02/15/21   [provider]  torsemide (DEMADEX) 20 MG tablet Take 20 mg by mouth daily. 12/08/20   [provider]  traZODone (DESYREL) 100 MG tablet Take 200 mg by mouth at bedtime.    [provider]   Physical Exam: Vitals:   05/31/2021 1804 05/16/2021 1807 05/24/2021 1815  BP: 134/79    Pulse: 93  94  Resp: (!) 22  20  Temp:  98.4 F (  36.9 C)   TempSrc:  Oral   SpO2: 94%  98%   Constitutional: appears age appropriate, frail, NAD, calm, comfortable Eyes: PERRL, lids and conjunctivae normal ENMT: Mucous membranes are moist. Posterior pharynx clear of any exudate or lesions. Age-appropriate dentition.  Hearing appropriate Neck: normal, supple, no masses, no thyromegaly Respiratory: clear to auscultation bilaterally, no wheezing, no crackles. Normal respiratory effort. No accessory muscle use.  Cardiovascular: Regular rate and rhythm, no murmurs / rubs / gallops. No extremity edema. 2+ pedal pulses. No carotid bruits.  Abdomen: obese abdomen, no tenderness, no masses palpated, no hepatosplenomegaly. Bowel sounds positive.  Musculoskeletal: no clubbing / cyanosis. No joint deformity upper and lower extremities. Good ROM, no contractures, no atrophy. Normal muscle tone.  Skin: no rashes, lesions, ulcers. No induration Neurologic: Sensation intact. Strength 5/5 in all 4.  Psychiatric: Normal judgment and insight. Alert and oriented x 3. Normal mood.   EKG: independently reviewed, showing atrial fibrillation with rate of 96, QTc 478  Chest x-ray on Admission: I personally reviewed and I agree with radiologist reading as below.  US Venous Img Lower Unilateral Left  Result Date: 05/24/2021 CLINICAL DATA:  Initial evaluation for acute swelling of left calf, evaluate for DVT. EXAM: Left LOWER EXTREMITY VENOUS DOPPLER ULTRASOUND TECHNIQUE: Gray-scale sonography with graded compression, as well as color Doppler and duplex ultrasound were performed to evaluate the lower extremity deep venous systems from the level of the common femoral vein and including the common femoral, femoral, profunda femoral, popliteal and calf veins including the posterior tibial, peroneal and gastrocnemius veins when visible. The superficial great saphenous vein was also interrogated. Spectral Doppler was utilized to evaluate flow at rest and with distal augmentation maneuvers in the common femoral, femoral and popliteal veins. COMPARISON:  None. FINDINGS: Contralateral Common Femoral Vein: Respiratory phasicity is normal and symmetric with the symptomatic side. No evidence of thrombus. Normal compressibility. Common Femoral Vein:  No evidence of thrombus. Normal compressibility, respiratory phasicity and response to augmentation. Saphenofemoral Junction: No evidence of thrombus. Normal compressibility and flow on color Doppler imaging. Profunda Femoral Vein: No evidence of thrombus. Normal compressibility and flow on color Doppler imaging. Femoral Vein: No evidence of thrombus. Normal compressibility, respiratory phasicity and response to augmentation. Popliteal Vein: No evidence of thrombus. Normal compressibility, respiratory phasicity and response to augmentation. Calf Veins: No evidence of thrombus. Normal compressibility and flow on color Doppler imaging. Superficial Great Saphenous Vein: No evidence of thrombus. Normal compressibility. Venous Reflux:  None. Other Findings:  None. IMPRESSION: No evidence of deep venous thrombosis. Electronically Signed   By: Jeannine Boga M.D.   On: 05/19/2021 19:41   DG Chest Port 1 View  Result Date: 05/04/2021 CLINICAL DATA:  Pt to ED via [facility] from home for shortness of breath. Pt speaking in complete sentences stating that she cannot breath. EXAM: PORTABLE CHEST 1 VIEW COMPARISON:  05/21/2021 FINDINGS: Stable changes from prior cardiac surgery and aortic valve replacement. Cardiac silhouette is enlarged. No mediastinal or hilar masses. Bilateral interstitial and patchy hazy airspace lung opacities. Interstitial opacities are similar to the prior exam, airspace opacities appear new. No convincing pleural effusion.  No pneumothorax. Skeletal structures are grossly intact. IMPRESSION: 1. Interstitial and hazy airspace lung opacities. Congestive heart failure with pulmonary edema suspected. Multifocal pneumonia is also in the differential diagnosis. Electronically Signed   By: Lajean Manes M.D.   On: 05/17/2021 18:42    Labs on Admission: I have personally reviewed following labs  CBC: Recent Labs  Lab 05/05/2021 1808  WBC 14.6*  HGB 7.5*  HCT 24.9*  MCV 82.5  PLT 967   Basic  Metabolic Panel: Recent Labs  Lab 05/11/2021 1808  NA 134*  K 3.7  CL 99  CO2 25  GLUCOSE 120*  BUN 28*  CREATININE 1.39*  CALCIUM 7.8*   GFR: Estimated Creatinine Clearance: 40 mL/min (A) (by C-G formula based on SCr of 1.39 mg/dL (H)).  Urine analysis:    Component Value Date/Time   COLORURINE STRAW (A) 04/14/2021 1516   APPEARANCEUR HAZY (A) 04/14/2021 1516   APPEARANCEUR Clear 11/25/2012 1335   LABSPEC 1.006 04/14/2021 1516   LABSPEC 1.017 11/25/2012 1335   PHURINE 5.0 04/14/2021 1516   GLUCOSEU NEGATIVE 04/14/2021 1516   GLUCOSEU Negative 11/25/2012 1335   HGBUR NEGATIVE 04/14/2021 1516   HGBUR negative 11/11/2009 0854   BILIRUBINUR NEGATIVE 04/14/2021 1516   BILIRUBINUR Negative 11/25/2012 1335   KETONESUR NEGATIVE 04/14/2021 1516   PROTEINUR NEGATIVE 04/14/2021 1516   UROBILINOGEN 0.2 11/11/2009 0854   NITRITE NEGATIVE 04/14/2021 1516   LEUKOCYTESUR NEGATIVE 04/14/2021 1516   LEUKOCYTESUR Negative 11/25/2012 1335   Dr. Tobie Poet [facility]  If 7PM-7AM, please contact overnight-coverage provider If 7AM-7PM, please contact day coverage provider [email or web address]  05/26/2021, 8:56 PM

## 2021-06-03 NOTE — ED Notes (Signed)
Pt remains anxious; stated felt wet so checked positioning of purewick--in place but pt's briefs were notably wet; suction at 80; linens changed; briefs changed; repositioned in bed; stretcher locked low; rails up; call bell within reach; pt urinated some on floor as linens being changed; floor cleaned up; pt educated as was asking why she still feels like she can't breathe well; purewick reapplied. Primary RN notified via secure chat.

## 2021-06-03 NOTE — ED Notes (Signed)
EDP has seen pt at bedside. Pt extremely anxious. Encouraged pt to slow down breathing. Provided fan and wet washcloth. Husband on way from lobby. Pt complains of tight pain in upper R abdominal area with breathing.

## 2021-06-03 NOTE — ED Notes (Signed)
No covid swabs available. Called lab to send covid swabs.

## 2021-06-04 ENCOUNTER — Observation Stay (HOSPITAL_COMMUNITY)
Admit: 2021-06-04 | Discharge: 2021-06-04 | Disposition: A | Discharge status: Home / Self Care | Payer: Medicare HMO | Attending: Internal Medicine | Admitting: Internal Medicine

## 2021-06-04 DIAGNOSIS — J439 Emphysema, unspecified: Secondary | ICD-10-CM | POA: Diagnosis not present

## 2021-06-04 DIAGNOSIS — I25118 Atherosclerotic heart disease of native coronary artery with other forms of angina pectoris: Secondary | ICD-10-CM | POA: Diagnosis not present

## 2021-06-04 DIAGNOSIS — I5033 Acute on chronic diastolic (congestive) heart failure: Secondary | ICD-10-CM | POA: Diagnosis present

## 2021-06-04 DIAGNOSIS — N1832 Chronic kidney disease, stage 3b: Secondary | ICD-10-CM | POA: Diagnosis present

## 2021-06-04 DIAGNOSIS — N179 Acute kidney failure, unspecified: Secondary | ICD-10-CM | POA: Diagnosis present

## 2021-06-04 DIAGNOSIS — Z953 Presence of xenogenic heart valve: Secondary | ICD-10-CM | POA: Diagnosis not present

## 2021-06-04 DIAGNOSIS — I2781 Cor pulmonale (chronic): Secondary | ICD-10-CM | POA: Diagnosis present

## 2021-06-04 DIAGNOSIS — J189 Pneumonia, unspecified organism: Secondary | ICD-10-CM | POA: Diagnosis present

## 2021-06-04 DIAGNOSIS — E1122 Type 2 diabetes mellitus with diabetic chronic kidney disease: Secondary | ICD-10-CM | POA: Diagnosis present

## 2021-06-04 DIAGNOSIS — D509 Iron deficiency anemia, unspecified: Secondary | ICD-10-CM | POA: Diagnosis present

## 2021-06-04 DIAGNOSIS — F03911 Unspecified dementia, unspecified severity, with agitation: Secondary | ICD-10-CM | POA: Diagnosis present

## 2021-06-04 DIAGNOSIS — J9620 Acute and chronic respiratory failure, unspecified whether with hypoxia or hypercapnia: Secondary | ICD-10-CM | POA: Diagnosis not present

## 2021-06-04 DIAGNOSIS — I10 Essential (primary) hypertension: Secondary | ICD-10-CM | POA: Diagnosis not present

## 2021-06-04 DIAGNOSIS — Z66 Do not resuscitate: Secondary | ICD-10-CM | POA: Diagnosis present

## 2021-06-04 DIAGNOSIS — Z515 Encounter for palliative care: Secondary | ICD-10-CM | POA: Diagnosis not present

## 2021-06-04 DIAGNOSIS — J9621 Acute and chronic respiratory failure with hypoxia: Secondary | ICD-10-CM | POA: Diagnosis present

## 2021-06-04 DIAGNOSIS — Z8616 Personal history of COVID-19: Secondary | ICD-10-CM | POA: Diagnosis not present

## 2021-06-04 DIAGNOSIS — E785 Hyperlipidemia, unspecified: Secondary | ICD-10-CM | POA: Diagnosis not present

## 2021-06-04 DIAGNOSIS — J440 Chronic obstructive pulmonary disease with acute lower respiratory infection: Secondary | ICD-10-CM | POA: Diagnosis present

## 2021-06-04 DIAGNOSIS — E890 Postprocedural hypothyroidism: Secondary | ICD-10-CM | POA: Diagnosis present

## 2021-06-04 DIAGNOSIS — R0603 Acute respiratory distress: Secondary | ICD-10-CM | POA: Diagnosis not present

## 2021-06-04 DIAGNOSIS — R0609 Other forms of dyspnea: Secondary | ICD-10-CM

## 2021-06-04 DIAGNOSIS — E669 Obesity, unspecified: Secondary | ICD-10-CM | POA: Diagnosis present

## 2021-06-04 DIAGNOSIS — E1169 Type 2 diabetes mellitus with other specified complication: Secondary | ICD-10-CM | POA: Diagnosis not present

## 2021-06-04 DIAGNOSIS — F03918 Unspecified dementia, unspecified severity, with other behavioral disturbance: Secondary | ICD-10-CM | POA: Diagnosis present

## 2021-06-04 DIAGNOSIS — E1151 Type 2 diabetes mellitus with diabetic peripheral angiopathy without gangrene: Secondary | ICD-10-CM | POA: Diagnosis present

## 2021-06-04 DIAGNOSIS — Z7189 Other specified counseling: Secondary | ICD-10-CM | POA: Diagnosis not present

## 2021-06-04 DIAGNOSIS — R0602 Shortness of breath: Secondary | ICD-10-CM | POA: Diagnosis present

## 2021-06-04 DIAGNOSIS — D631 Anemia in chronic kidney disease: Secondary | ICD-10-CM | POA: Diagnosis present

## 2021-06-04 DIAGNOSIS — I130 Hypertensive heart and chronic kidney disease with heart failure and stage 1 through stage 4 chronic kidney disease, or unspecified chronic kidney disease: Secondary | ICD-10-CM | POA: Diagnosis present

## 2021-06-04 DIAGNOSIS — I359 Nonrheumatic aortic valve disorder, unspecified: Secondary | ICD-10-CM | POA: Diagnosis not present

## 2021-06-04 DIAGNOSIS — J9622 Acute and chronic respiratory failure with hypercapnia: Secondary | ICD-10-CM | POA: Diagnosis present

## 2021-06-04 DIAGNOSIS — F411 Generalized anxiety disorder: Secondary | ICD-10-CM | POA: Diagnosis not present

## 2021-06-04 DIAGNOSIS — F3341 Major depressive disorder, recurrent, in partial remission: Secondary | ICD-10-CM | POA: Diagnosis present

## 2021-06-04 DIAGNOSIS — J441 Chronic obstructive pulmonary disease with (acute) exacerbation: Secondary | ICD-10-CM | POA: Diagnosis present

## 2021-06-04 DIAGNOSIS — J449 Chronic obstructive pulmonary disease, unspecified: Secondary | ICD-10-CM | POA: Diagnosis not present

## 2021-06-04 DIAGNOSIS — E039 Hypothyroidism, unspecified: Secondary | ICD-10-CM | POA: Diagnosis not present

## 2021-06-04 LAB — BASIC METABOLIC PANEL
Anion gap: 12 (ref 5–15)
BUN: 29 mg/dL — ABNORMAL HIGH (ref 8–23)
CO2: 26 mmol/L (ref 22–32)
Calcium: 8 mg/dL — ABNORMAL LOW (ref 8.9–10.3)
Chloride: 96 mmol/L — ABNORMAL LOW (ref 98–111)
Creatinine, Ser: 1.58 mg/dL — ABNORMAL HIGH (ref 0.44–1.00)
GFR, Estimated: 34 mL/min — ABNORMAL LOW (ref >60)
Glucose, Bld: 195 mg/dL — ABNORMAL HIGH (ref 70–99)
Potassium: 4.3 mmol/L (ref 3.5–5.1)
Sodium: 134 mmol/L — ABNORMAL LOW (ref 135–145)

## 2021-06-04 LAB — CBC
HCT: 25 % — ABNORMAL LOW (ref 36.0–46.0)
Hemoglobin: 7.4 g/dL — ABNORMAL LOW (ref 12.0–15.0)
MCH: 24.2 pg — ABNORMAL LOW (ref 26.0–34.0)
MCHC: 29.6 g/dL — ABNORMAL LOW (ref 30.0–36.0)
MCV: 81.7 fL (ref 80.0–100.0)
Platelets: 216 10*3/uL (ref 150–400)
RBC: 3.06 MIL/uL — ABNORMAL LOW (ref 3.87–5.11)
RDW: 16.6 % — ABNORMAL HIGH (ref 11.5–15.5)
WBC: 12.1 10*3/uL — ABNORMAL HIGH (ref 4.0–10.5)
nRBC: 0 % (ref 0.0–0.2)

## 2021-06-04 LAB — RESP PANEL BY RT-PCR (FLU A&B, COVID) ARPGX2
Influenza A by PCR: NEGATIVE
Influenza B by PCR: NEGATIVE
SARS Coronavirus 2 by RT PCR: NEGATIVE

## 2021-06-04 LAB — ECHOCARDIOGRAM COMPLETE
AR max vel: 2.87 cm2
AV Area VTI: 4.24 cm2
AV Area mean vel: 2.86 cm2
AV Mean grad: 2 mmHg
AV Peak grad: 4.1 mmHg
Ao pk vel: 1.01 m/s
Area-P 1/2: 3 cm2
Calc EF: 73.9 %
MV VTI: 1.02 cm2
S' Lateral: 3.15 cm
Single Plane A2C EF: 71.1 %
Single Plane A4C EF: 74.2 %

## 2021-06-04 LAB — CBG MONITORING, ED
Glucose-Capillary: 214 mg/dL — ABNORMAL HIGH (ref 70–99)
Glucose-Capillary: 245 mg/dL — ABNORMAL HIGH (ref 70–99)
Glucose-Capillary: 280 mg/dL — ABNORMAL HIGH (ref 70–99)
Glucose-Capillary: 410 mg/dL — ABNORMAL HIGH (ref 70–99)

## 2021-06-04 MED ORDER — SODIUM CHLORIDE 0.9 % IV SOLN
500.0000 mg | INTRAVENOUS | Status: DC
  Administered 2021-06-04 – 2021-06-07 (×3): 500 mg via INTRAVENOUS
  Filled 2021-06-04: qty 5
  Filled 2021-06-04: qty 500
  Filled 2021-06-04 (×3): qty 5

## 2021-06-04 MED ORDER — SODIUM CHLORIDE 0.9 % IV SOLN
2.0000 g | INTRAVENOUS | Status: DC
  Administered 2021-06-04 – 2021-06-06 (×4): 2 g via INTRAVENOUS
  Filled 2021-06-04 (×2): qty 20
  Filled 2021-06-04: qty 2
  Filled 2021-06-04: qty 20
  Filled 2021-06-04: qty 2

## 2021-06-04 MED ORDER — NIRMATRELVIR/RITONAVIR (PAXLOVID) TABLET (RENAL DOSING): 2.0000 | ORAL_TABLET | Freq: Two times a day (BID) | ORAL | Status: DC

## 2021-06-04 MED ORDER — PERFLUTREN LIPID MICROSPHERE
1.0000 mL | INTRAVENOUS | Status: AC | PRN
  Administered 2021-06-04: 2 mL via INTRAVENOUS
  Filled 2021-06-04: qty 10

## 2021-06-04 MED ORDER — METHYLPREDNISOLONE SODIUM SUCC 40 MG IJ SOLR
40.0000 mg | Freq: Two times a day (BID) | INTRAMUSCULAR | Status: DC
Start: 2021-06-05 — End: 2021-06-04

## 2021-06-04 MED ORDER — METHYLPREDNISOLONE SODIUM SUCC 125 MG IJ SOLR: 60.0000 mg | Freq: Two times a day (BID) | INTRAMUSCULAR | Status: DC

## 2021-06-04 NOTE — ED Notes (Signed)
Pt assisted to toilet to have a BM 

## 2021-06-04 NOTE — ED Notes (Signed)
Pt now asking for tylenol.

## 2021-06-04 NOTE — ED Notes (Signed)
Patient sat up with breakfast tray eating

## 2021-06-04 NOTE — ED Notes (Signed)
Pt resting in bed. Appears to be sleeping at this time. Chest is rising and falling symmetrically. No acute distress noted. Will continue to monitor.   °

## 2021-06-04 NOTE — ED Notes (Signed)
Purewick leaked in bed. Pt is changed and clean sheets are placed on bed. Pt repositioned in bed. Pt denies any other needs at this time.

## 2021-06-04 NOTE — ED Notes (Signed)
Pt has complained about not being able to eat her food today due to her not having her teeth. She requested a change in her diet to soft.

## 2021-06-04 NOTE — ED Notes (Signed)
Pt complaining of pain. Offered tylenol. Pt refused.

## 2021-06-04 NOTE — Progress Notes (Signed)
PROGRESS NOTE    [name]  FVC:[id number] DOB: Nov 01, 1945 DOA: 05/21/2021 PCP: Rusty Aus, MD   Brief Narrative:  [name] is a 76 y.o. 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  Patient presents with acute on chronic malaise weakness dyspnea with exertion now having profound dyspnea even at rest.  Patient's husband at bedside indicates that after her recent surgery and TAVR in November she has had worsening weakness, has essentially become bed bound and wheelchair dependent, she can ambulate very short distances, up to 10 feet but have become so taxing on her over the past few weeks that she essentially chooses not to ambulate at all at this point.  Given patient's severe acute hypoxia, malaise weakness elevated BNP and D-dimer hospitalist was called for admission, cardiology requested for consult.  Assessment & Plan:    Acute hypoxic respiratory failure, multifactorial, POA Rule out atypical pneumonia versus cardiogenic etiology -Cardiology consulted given recent TAVR, echocardiogram pending for further evaluation although husband admits that echo postprocedure last month was "normal" per his understanding -Chest x-ray shows diffuse bilateral infiltrate concerning for viral etiology although COVID and flu remain negative we will treat supportively, continue antibiotics (ceftriaxone and azithromycin) for possible underlying bacterial  pneumonia -Currently requiring up to 6 L nasal cannula at bedside this morning to maintain sats above 95%, baseline is on room air -D-dimer slightly elevated but CTA negative for PE, DVT studies negative as well -Pending cardiology evaluation given recent TAVR she may require transfer to main campus but will defer this decision to cardiology  Ambulatory dysfunction, frequent falls, left knee ecchymosis, POA -PT OT to follow once medically stable for ambulation -Left knee imaging unremarkable for fracture  Insulin-dependent diabetes mellitus type 2 -Continue sliding scale insulin, hypoglycemic protocol  -Home insulin 100 units at bedtime, will continue as long as patient's appetite remains appropriate, will discontinue if she continues to have poor p.o. intake as described by husband   Depression, Generalized anxiety disorder, Anxiety - Resume home duloxetine 20 mg every morning, fluoxetine 40 mg twice daily; Ativan 0.5 mg every 8 hours as needed for anxiety  Insomnia-resumed home trazodone 200 mg nightly Hypothyroid-resumed home levothyroxine 125 mcg daily GERD-PPI  DVT prophylaxis: Lovenox Code Status: DNR Family Communication: Husband at bedside  Status is: Inpatient  Dispo: The patient is from: Home              Anticipated d/c is to: To be determined              Anticipated d/c date is: 72+ hours              Patient currently not medically stable for discharge  Consultants:  Cardiology  Procedures:  None planned  Antimicrobials:  Azithromycin, ceftriaxone x5 days  Subjective: No acute issues or events overnight, patient's respiratory status appears to be improving somewhat over the past 12 hours per husband, mental status continues to wax and wane.  Patient overtly denies chest pain nausea vomiting diarrhea constipation headache  fevers chills but continues to remark on general malaise General weakness and feeling "rundown"  Objective: Vitals:   05/13/2021 2300  06/04/21 0052 06/04/21 0624 06/04/21 0751  BP: (!) 164/68 (!) 107/51 (!) 151/77 136/64  Pulse: 94 92 80 87  Resp:  17 16 15   Temp:      TempSrc:      SpO2: 97% 96% 100% 99%    Intake/Output Summary (Last 24 hours) at 06/04/2021 0804 Last data filed at 06/04/2021 0737 Gross per 24 hour  Intake 291.78 ml  Output 400 ml  Net -108.22 ml   There were no vitals filed for this visit.  Examination:  General: Somnolent but easily arousable HEENT:  Normocephalic atraumatic.  Sclerae nonicteric, noninjected.  Extraocular movements intact bilaterally. Neck:  Without mass or deformity.  Trachea is midline. Lungs: Markedly diminished with scant expiratory wheeze and bibasilar rales Heart:  Regular rate and rhythm.  Without murmurs, rubs, or gallops. Abdomen:  Soft, nontender, nondistended.  Without guarding or rebound. Extremities: Without cyanosis, clubbing, edema, or obvious deformity. Vascular:  Dorsalis pedis and posterior tibial pulses palpable bilaterally. Skin:  Warm and dry, no erythema, no ulcerations.  Data Reviewed: I have personally reviewed following labs and imaging studies  CBC: Recent Labs  Lab 05/07/2021 1808  WBC 14.6*  HGB 7.5*  HCT 24.9*  MCV 82.5  PLT 185   Basic Metabolic Panel: Recent Labs  Lab 05/04/2021 1808  NA 134*  K 3.7  CL 99  CO2 25  GLUCOSE 120*  BUN 28*  CREATININE 1.39*  CALCIUM 7.8*   GFR: Estimated Creatinine Clearance: 40 mL/min (A) (by C-G formula based on SCr of 1.39 mg/dL (H)). Liver Function Tests: No results for input(s): AST, ALT, ALKPHOS, BILITOT, PROT, ALBUMIN in the last 168 hours. No results for input(s): LIPASE, AMYLASE in the last 168 hours. No results for input(s): AMMONIA in the last 168 hours. Coagulation Profile: No results for input(s): INR, PROTIME in the last 168 hours. Cardiac Enzymes: No results for input(s): CKTOTAL, CKMB, CKMBINDEX, TROPONINI in the last 168 hours. BNP (last 3 results) No results for input(s):  PROBNP in the last 8760 hours. HbA1C: No results for input(s): HGBA1C in the last 72 hours. CBG: Recent Labs  Lab 05/11/2021 2124 06/04/21 0739  GLUCAP 153* 214*   Lipid Profile: No results for input(s): CHOL, HDL, LDLCALC, TRIG, CHOLHDL, LDLDIRECT in the last 72 hours. Thyroid Function Tests: No results for input(s): TSH, T4TOTAL, FREET4, T3FREE, THYROIDAB in the last 72 hours. Anemia Panel: No results for input(s): VITAMINB12, FOLATE, FERRITIN, TIBC, IRON, RETICCTPCT in the last 72 hours. Sepsis Labs: Recent Labs  Lab 05/22/2021 1808  PROCALCITON 0.17    No results found for this or any previous visit (from the past 240 hour(s)).   Radiology Studies: CT Angio Chest Pulmonary Embolism (PE) W or WO Contrast  Result Date: 06/04/2021 CLINICAL DATA:  Pulmonary embolism (PE) suspected, positive D-dimer EXAM: CT ANGIOGRAPHY CHEST WITH CONTRAST TECHNIQUE: Multidetector CT imaging of the chest was performed using the standard protocol during bolus administration of intravenous contrast. Multiplanar CT image reconstructions and MIPs were obtained to evaluate the vascular anatomy. CONTRAST:  11mL OMNIPAQUE IOHEXOL 350 MG/ML SOLN COMPARISON:  02/01/2021 FINDINGS: Cardiovascular: Adequate opacification of the pulmonary arterial tree. No intraluminal filling defect identified to suggest acute pulmonary embolism. Central pulmonary arteries are enlarged in keeping with changes of pulmonary arterial hypertension, unchanged. Transcatheter aortic valve replacement has been performed. Multi-vessel coronary artery stenting has been performed as  well as coronary artery bypass grafting. Cardiomegaly is present, stable. No pericardial effusion. Extensive atheromatous plaque is seen within the thoracic aorta demonstrating mural irregularity and numerous shallow ulcer like projections. No aortic aneurysm. Mediastinum/Nodes: The thyroid gland is absent or atretic. The esophagus is unremarkable. There is interval  enlargement of multiple pathologically enlarged lymph nodes within the right paratracheal and prevascular lymph node groups which measure up to 19 mm in short axis diameter. Numerous shotty bilateral hilar lymph nodes are also identified. Lungs/Pleura: Extensive bilateral asymmetric ground-glass pulmonary infiltrate has developed, most in keeping with atypical infection in the acute setting. No pneumothorax or pleural effusion. No central obstructing lesion. Upper Abdomen: No acute abnormality. Musculoskeletal: No acute bone abnormality. No lytic or blastic bone lesion. Review of the MIP images confirms the above findings. IMPRESSION: No pulmonary embolism. Extensive multifocal pulmonary infiltrate most in keeping with atypical infection in the acute setting. Stable cardiomegaly. Aortic Atherosclerosis (ICD10-I70.0). Electronically Signed   By: Fidela Salisbury M.D.   On: 06/04/2021 00:04   US Venous Img Lower Unilateral Left  Result Date: 06/01/2021 CLINICAL DATA:  Initial evaluation for acute swelling of left calf, evaluate for DVT. EXAM: Left LOWER EXTREMITY VENOUS DOPPLER ULTRASOUND TECHNIQUE: Gray-scale sonography with graded compression, as well as color Doppler and duplex ultrasound were performed to evaluate the lower extremity deep venous systems from the level of the common femoral vein and including the common femoral, femoral, profunda femoral, popliteal and calf veins including the posterior tibial, peroneal and gastrocnemius veins when visible. The superficial great saphenous vein was also interrogated. Spectral Doppler was utilized to evaluate flow at rest and with distal augmentation maneuvers in the common femoral, femoral and popliteal veins. COMPARISON:  None. FINDINGS: Contralateral Common Femoral Vein: Respiratory phasicity is normal and symmetric with the symptomatic side. No evidence of thrombus. Normal compressibility. Common Femoral Vein: No evidence of thrombus. Normal compressibility,  respiratory phasicity and response to augmentation. Saphenofemoral Junction: No evidence of thrombus. Normal compressibility and flow on color Doppler imaging. Profunda Femoral Vein: No evidence of thrombus. Normal compressibility and flow on color Doppler imaging. Femoral Vein: No evidence of thrombus. Normal compressibility, respiratory phasicity and response to augmentation. Popliteal Vein: No evidence of thrombus. Normal compressibility, respiratory phasicity and response to augmentation. Calf Veins: No evidence of thrombus. Normal compressibility and flow on color Doppler imaging. Superficial Great Saphenous Vein: No evidence of thrombus. Normal compressibility. Venous Reflux:  None. Other Findings:  None. IMPRESSION: No evidence of deep venous thrombosis. Electronically Signed   By: Jeannine Boga M.D.   On: 05/31/2021 19:41   DG Chest Port 1 View  Result Date: 06/02/2021 CLINICAL DATA:  Pt to ED via [facility] from home for shortness of breath. Pt speaking in complete sentences stating that she cannot breath. EXAM: PORTABLE CHEST 1 VIEW COMPARISON:  05/21/2021 FINDINGS: Stable changes from prior cardiac surgery and aortic valve replacement. Cardiac silhouette is enlarged. No mediastinal or hilar masses. Bilateral interstitial and patchy hazy airspace lung opacities. Interstitial opacities are similar to the prior exam, airspace opacities appear new. No convincing pleural effusion.  No pneumothorax. Skeletal structures are grossly intact. IMPRESSION: 1. Interstitial and hazy airspace lung opacities. Congestive heart failure with pulmonary edema suspected. Multifocal pneumonia is also in the differential diagnosis. Electronically Signed   By: Lajean Manes M.D.   On: 05/14/2021 18:42        Scheduled Meds:  aspirin EC  81 mg Oral Daily   azelastine  1 spray Each Nare BID  clopidogrel  75 mg Oral Daily   DULoxetine  20 mg Oral Q breakfast   enoxaparin (LOVENOX) injection  0.5 mg/kg Subcutaneous  Q24H   FLUoxetine  40 mg Oral BID   furosemide  40 mg Intravenous Daily   insulin aspart  0-15 Units Subcutaneous TID WC   insulin aspart  0-5 Units Subcutaneous QHS   insulin glargine-yfgn  100 Units Subcutaneous QHS   ipratropium-albuterol  3 mL Nebulization TID   isosorbide mononitrate  30 mg Oral Daily   levothyroxine  125 mcg Oral Q0600   LORazepam       losartan  25 mg Oral Q breakfast   pantoprazole  40 mg Oral Q breakfast   traZODone  200 mg Oral QHS   Continuous Infusions:  azithromycin Stopped (06/04/21 0235)   cefTRIAXone (ROCEPHIN)  IV Stopped (06/04/21 0122)    LOS: 0 days   Time spent: 57min  Houston Zapien C Kamree Wiens, DO [facility]  If 7PM-7AM, please contact night-coverage [email or web address]  06/04/2021, 8:04 AM

## 2021-06-04 NOTE — ED Notes (Signed)
Pt husband updated with patient status. Pt also called her husband.

## 2021-06-04 NOTE — ED Notes (Signed)
New brief placed on patient. Purwick replaced. Sat up in bed

## 2021-06-04 NOTE — ED Notes (Signed)
Pt c/o of pain. Medication will be given

## 2021-06-04 NOTE — ED Notes (Signed)
This RN entered pt room. Pt has taken out IV. Pt states, "it just didn't feel good". Site assessed. No signs of infiltration.

## 2021-06-04 NOTE — ED Notes (Signed)
Pt is very restless and agitated. Will pull PO ativan.

## 2021-06-04 NOTE — ED Notes (Signed)
Assisted Pt to toilet to have BM.Pt having another panic attack. RN attempted to calm pt.

## 2021-06-04 NOTE — ED Notes (Signed)
Pt ate her dinner tray.

## 2021-06-04 NOTE — Progress Notes (Signed)
*  PRELIMINARY RESULTS* Echocardiogram 2D Echocardiogram has been performed. Echo ordered with the option to give Definity. Definity IV Contrast used on this study (2 ml), the patient tolerated and no adverse reactions noted.  Claretta Fraise 06/04/2021, 1:41 PM

## 2021-06-04 DEATH — deceased

## 2021-06-05 ENCOUNTER — Encounter: Payer: Self-pay | Admitting: Internal Medicine

## 2021-06-05 DIAGNOSIS — E1169 Type 2 diabetes mellitus with other specified complication: Secondary | ICD-10-CM | POA: Diagnosis not present

## 2021-06-05 DIAGNOSIS — I10 Essential (primary) hypertension: Secondary | ICD-10-CM | POA: Diagnosis not present

## 2021-06-05 DIAGNOSIS — I25118 Atherosclerotic heart disease of native coronary artery with other forms of angina pectoris: Secondary | ICD-10-CM | POA: Diagnosis not present

## 2021-06-05 DIAGNOSIS — E785 Hyperlipidemia, unspecified: Secondary | ICD-10-CM | POA: Diagnosis not present

## 2021-06-05 DIAGNOSIS — J9620 Acute and chronic respiratory failure, unspecified whether with hypoxia or hypercapnia: Secondary | ICD-10-CM | POA: Diagnosis not present

## 2021-06-05 DIAGNOSIS — I359 Nonrheumatic aortic valve disorder, unspecified: Secondary | ICD-10-CM | POA: Diagnosis not present

## 2021-06-05 DIAGNOSIS — R0603 Acute respiratory distress: Secondary | ICD-10-CM | POA: Diagnosis not present

## 2021-06-05 DIAGNOSIS — E039 Hypothyroidism, unspecified: Secondary | ICD-10-CM | POA: Diagnosis not present

## 2021-06-05 DIAGNOSIS — N183 Chronic kidney disease, stage 3 unspecified: Secondary | ICD-10-CM

## 2021-06-05 LAB — CBG MONITORING, ED
Glucose-Capillary: 153 mg/dL — ABNORMAL HIGH (ref 70–99)
Glucose-Capillary: 229 mg/dL — ABNORMAL HIGH (ref 70–99)
Glucose-Capillary: 229 mg/dL — ABNORMAL HIGH (ref 70–99)

## 2021-06-05 NOTE — Consult Note (Addendum)
Cardiology Consult    Patient ID: LAMYIAH CRAWSHAW MRN: [id number], DOB/AGE: 1945/11/30   Admit date: 05/08/2021 Date of Consult: 06/05/2021  Primary Physician: Rusty Aus, MD Primary Cardiologist: None Requesting Provider: Reola Mosher, MD  Patient Profile    [name] is a 76 y.o. female with a history of CAD s/p CABG x 2 (10/2013), AS s/p AVR (10/2013) and TAVR (04/2021), HTN, HL, DMII, severe COPD on home O2, stage III chronic kidney disease, anxiety, chronic dyspnea, panic attacks, obesity, and progressive dementia, who is being seen today for the evaluation of dyspnea in the setting of PNA at the request of Dr. Avon Gully.  Past Medical History   Past Medical History:  Diagnosis Date   1st degree AV block    ACE-inhibitor cough    Allergic rhinitis    Anemia    iron deficiency anemia   Anxiety    Aortic ectasia (HCC)    a. CT abd in 12/2016 incidentally noted aortic atherosclerosis and infrarenal abdominal aortic ectasia measuring as large as 2.7 cm with recommendation to repeat US in 2023.   Aortic stenosis    a. 10/2013 s/p AVR; b. 04/2021 s/p TAVR.   Arthritis    Asthma    Cataract    Chronic depression    Chronic diastolic CHF (congestive heart failure) (HCC)    Chronic headache    CKD (chronic kidney disease), stage III (HCC)    COPD (chronic obstructive pulmonary disease) (HCC)    Coronary artery disease    a. DES to RCA and mid Cx 2009. b. CABG x 2 and bioprosthetic AVR May 2015. c. cutting balloon to prox Cx in 05/2016; d. 11/2020 PCI/DES to LCX and VG->RCA (prox and distal); e. 02/2021 Cath: LM nl, LAD 142m CTO, D1 min irregs, LCX 50p/m ISR, patent mid stent, RCA 20ost, 80p, 132m, RPDA nl, VG->dRCA patent p/d stents, LIMA->LAD patent.   Dementia ([place])    Diverticulitis of colon    Essential hypertension    GERD (gastroesophageal reflux disease)    Hearing loss    History of blood transfusion 2013   History of prosthetic aortic valve replacement    a. 10/2013  s/p bioprosthetic AVR; b. 04/2021 s/p TAVR w/ Oletta Lamas Sapien 3 THV (49mm); c. 05/2021 Echo: EF 55-60%, no PV leak, 17mmHg mean gradient; d. 06/2021 Echo: EF 55-60%, mod LVH. GrII DD. Nl RV size/fxn. Mildly dil LA. Small peric eff w/o tamponade. Triv MR. Mild-mod MS (mean grad 72mmHg), Nl fxn'ing AoV - peak grad 4.36mmHg, mean grad 2.34mmHg. AVA 4.24cm^2 (VTI).   HOH (hard of hearing)    Hypercholesterolemia    a. intolerance of statins and niaspan -   IDA (iron deficiency anemia) 02/03/2019   Mitral stenosis    a. 06/2021 Echo: Mild-mod MS (mean gradient 31mmHg.   Mobitz type 1 second degree AV block    OSA (obstructive sleep apnea)    mild, intolerant of cpap   PAD (peripheral artery disease) ([place])    a. atherosclerosis by CT abd 12/2016 in [place].   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 Sumner Boast & Dr. Cyndia Bent; b. 05/2021 Echo: EF 55-60%, no PV leak, 53mmHg mean gradient; c. 06/2021 Echo: EF 55-60%, mod LVH. GrII DD. Nl fxn'ing AoV - peak grad 4.72mmHg, mean grad 2.23mmHg. AVA 4.24cm^2 (VTI).   Statin  intolerance    Thyroid disease    Type II diabetes mellitus ([place])     Past Surgical History:  Procedure Laterality Date   ABDOMINAL HYSTERECTOMY     ABDOMINAL HYSTERECTOMY W/ PARTIAL VAGINACTOMY     AORTIC VALVE REPLACEMENT N/A 10/12/2013   Procedure: AORTIC VALVE REPLACEMENT (AVR);  Surgeon: [name], MD;  Location: [place];  Service: Open Heart Surgery;  Laterality: N/A;   APPENDECTOMY  1964   BARTHOLIN GLAND CYST EXCISION     BLADDER SUSPENSION     BREAST BIOPSY Bilateral 09/11/2000   neg   BREAST BIOPSY Left 07/24/2010   neg   BREAST CYST EXCISION  1988   bilateral nonmalignant tumors, x3   CARDIAC CATHETERIZATION     CARDIAC CATHETERIZATION N/A 05/25/2016   Procedure: Coronary Balloon Angioplasty;  Surgeon: Leonie Man, MD;  Location: [place] [facility];  Service: Cardiovascular;  Laterality: N/A;   CARDIAC CATHETERIZATION N/A 05/25/2016   Procedure: Coronary/Graft Angiography;  Surgeon: Leonie Man, MD;  Location: [place] [facility];  Service: Cardiovascular;  Laterality: N/A;   CATARACT EXTRACTION W/ INTRAOCULAR LENS  IMPLANT, BILATERAL     CHOLECYSTECTOMY  2001   COLECTOMY     lap sigmoid   COLONOSCOPY  2014   polyps found, 2 clamped off.   CORONARY ANGIOPLASTY  10/29/2007   Prox RCA & Mid Cx.   CORONARY ARTERY BYPASS GRAFT N/A 10/12/2013   Procedure: CORONARY ARTERY BYPASS GRAFT TIMES TWO;  Surgeon: [name], MD;  Location: [place] [facility];  Service: Open Heart Surgery;  Laterality: N/A;   CORONARY/GRAFT ANGIOGRAPHY N/A 09/20/2017   Procedure: CORONARY/GRAFT ANGIOGRAPHY;  Surgeon: Sherren Mocha, MD;  Location: [place] [facility];  Service: Cardiovascular;  Laterality: N/A;   INTRAOPERATIVE TRANSTHORACIC ECHOCARDIOGRAM N/A 04/18/2021   Procedure: INTRAOPERATIVE TRANSTHORACIC ECHOCARDIOGRAM;  Surgeon: [name], MD;  Location: [place] [facility];  Service: Open Heart Surgery;  Laterality: N/A;   LEFT HEART CATHETERIZATION WITH CORONARY ANGIOGRAM N/A 10/09/2013   Procedure: LEFT HEART CATHETERIZATION WITH CORONARY ANGIOGRAM;  Surgeon: [name], MD;  Location: [facility];  Service: Cardiovascular;  Laterality: N/A;   RIGHT/LEFT HEART CATH AND CORONARY ANGIOGRAPHY N/A 11/03/2020   Procedure: RIGHT/LEFT HEART CATH AND CORONARY ANGIOGRAPHY;  Surgeon: Corey Skains, MD;  Location: [place] [facility];  Service: Cardiovascular;  Laterality: N/A;   RIGHT/LEFT HEART CATH AND CORONARY/GRAFT ANGIOGRAPHY N/A 02/10/2021   Procedure: RIGHT/LEFT HEART CATH AND CORONARY/GRAFT ANGIOGRAPHY;  Surgeon: Andrez Grime, MD;  Location: [place] [facility];  Service: Cardiovascular;  Laterality: N/A;   STERNAL WIRES REMOVAL N/A 04/13/2014   Procedure: STERNAL WIRES REMOVAL;  Surgeon: [name], MD;  Location:  [place];  Service: Thoracic;  Laterality: N/A;   TEE WITHOUT CARDIOVERSION N/A 11/03/2020   Procedure: TRANSESOPHAGEAL ECHOCARDIOGRAM (TEE);  Surgeon: Corey Skains, MD;  Location: [facility];  Service: Cardiovascular;  Laterality: N/A;   TEE WITHOUT CARDIOVERSION N/A 02/13/2021   Procedure: TRANSESOPHAGEAL ECHOCARDIOGRAM (TEE);  Surgeon: Corey Skains, MD;  Location: [facility];  Service: Cardiovascular;  Laterality: N/A;   THYROIDECTOMY     TONSILLECTOMY     TRANSCATHETER AORTIC VALVE REPLACEMENT, TRANSFEMORAL N/A 04/18/2021   Procedure: TRANSCATHETER AORTIC VALVE REPLACEMENT, TRANSFEMORAL;  Surgeon: [name], MD;  Location: [place] [facility];  Service: Open Heart Surgery;  Laterality: N/A;   TUBAL LIGATION     VAGINAL DELIVERY     3   VISCERAL ARTERY INTERVENTION  N/A 08/16/2016   Procedure: Visceral Artery Intervention;  Surgeon: Algernon Huxley, MD;  Location: [place] [facility];  Service: Cardiovascular;  Laterality: N/A;     Allergies  Allergies  Allergen Reactions   Amitriptyline Other (See Comments)    Unknown reaction   Benadryl [Diphenhydramine] Shortness Of Breath   Demerol [Meperidine] Other (See Comments)    Unknown reaction   Gabapentin Other (See Comments)    Unknown reaction   Mirtazapine Other (See Comments)    Unknown reaction   Olanzapine Other (See Comments)    Unknown reaction    Voltaren [Diclofenac Sodium] Shortness Of Breath   Zetia [Ezetimibe] Other (See Comments)    Weakness in legs, shakiness all over   Ativan [Lorazepam] Other (See Comments)    Causes double vision at highter than .5 mg dose   Atorvastatin Other (See Comments)    Muscle aches and weakness   Budesonide-Formoterol Fumarate Other (See Comments)    Shakiness, tremors   Bupropion Hcl Other (See Comments)    "cloud over me" depression   Caffeine Other (See Comments)    jitters   Codeine Sulfate Other (See Comments)    Makes chest hurt like a heart attack   Lisinopril  Cough   Metformin Nausea And Vomiting   Mometasone Furoate Nausea And Vomiting   Morphine Sulfate Other (See Comments)    Chest pain like a heart attack   Other Other (See Comments)    Beta Blockers, reaction shortness of breath   Oxycodone-Acetaminophen Nausea And Vomiting   Pioglitazone Other (See Comments)    Cannot take because of risk of bladder cancer   Propoxyphene N-Acetaminophen Nausea And Vomiting   Rosuvastatin Other (See Comments)    Muscle aches and weakness   Shellfish Allergy Diarrhea   Suvorexant Other (See Comments)    Jerking/nervous    Ticagrelor     Other reaction(s): Other (See Comments) "slowed heart rate" & chest pain   Tramadol Nausea Only   Venlafaxine Other (See Comments)    Unknown reaction   Xanax [Alprazolam] Other (See Comments)    Mental status changes, "didn't know where she was at" per husband at bedside   Zolpidem Tartrate Other (See Comments)     Jittery, diarrhea   Latex Rash    History of Present Illness    76 y/o ? with a history of CAD s/p CABG x 2 (10/2013), AS s/p AVR (10/2013) and TAVR (04/2021), HTN, HL, DMII, severe COPD on home O2, anxiety, stage III chronic kidney disease, chronic dyspnea, panic attacks, obesity, and progressive dementia.  As noted, she previously underwent CABG x2 with a LIMA to the LAD and vein graft to the right coronary artery in May 2015.  In the setting of aortic stenosis at that time, she also underwent bioprosthetic AVR with a 21 mm [facility] Ease valve.  She required admission to [facility] in 2017 with unstable angina with diagnostic catheterization revealing 2 of 2 patent grafts but in-stent restenosis within a previously placed left circumflex stent.  This was successfully treated with Cutting Balloon angioplasty.  Repeat catheterization 2019 showed stable anatomy.  In June 2022, she was admitted to [facility] with unstable angina and diagnostic catheterization revealed severe disease in the left circumflex as well  as proximal and distal vein graft to the right coronary artery.  She subsequently underwent PCI and drug-eluting stent placement all locations at [facility] in June 2022.  She required admission to [facility] in late August due  to COPD flare and pulmonary edema.  Echo on September 1 showed normal LV function and normal functioning bioprosthetic aortic valve.  She was readmitted with congestive few days with dyspnea with repeat echo showing moderate to severe bioprosthetic aortic valve stenosis with recommendation for TEE.  She was discharged but had another admission, this time to [facility] on September 22 with dyspnea and volume overload.  Repeat echo showed severe aortic valve stenosis and mild AI.  Diagnostic catheterization was performed in the setting mild troponin elevation and as part of preop work-up for structural heart team, and this revealed patent previously placed circumflex and vein graft RCA stents and otherwise stable anatomy as outlined in the past medical history.  She subsequently underwent outpatient work-up for TAVR and underwent successful TAVR on November 15 with placement of an [place] 23 mm bioprosthetic valve.  Post procedure TEE showed trivial perivalvular leak with mild, central AI.  The mean gradient was 22 mmHg with a peak gradient of 36 mmHg in the setting of valve in valve.  Ms. Mclamb followed up in structural heart clinic on December 14, at which time she was noted to be stable.  Echocardiogram on the same day showed an EF of 55% with normal functioning valve and mean gradient 33 mmHg without perivalvular leak.  Elevated gradient was again felt to be secondary to valve in valve and patient-prosthesis mismatch.  Ms. Newby has chronic dyspnea on exertion.  In the setting of patient's dementia, spoke with her husband today.  He notes that over the past week, she has been having increasing dyspnea with minimal activity.  She is very sedentary.  He thinks her weight is up but does not  necessarily think that she has put on excess fluid or has new edema.  Due to progressive dyspnea, patient's husband called EMS on December 31.  She was saturating at 92% on 3 L.  Here, she was hemodynamically stable.  EKG showed sinus rhythm with first-degree AV block and no acute ST or T changes.  Lab work notable for some worsening of baseline normocytic anemia with H&H of 7.5/24.9.  White count also elevated at 14.6.  D-dimer was elevated 2.02.  CTA of the chest was performed and showed no PE however, extensive multifocal pulmonary infiltrate suggestive of atypical infection was noted.  She was placed on antibiotics.  We have been asked to evaluate secondary to cardiac history and recent TAVR.  An echocardiogram was performed this morning which shows normal functioning aortic valve prosthesis and normal LV function.  Patient was without complaints upon my visit this morning.  Inpatient Medications     aspirin EC  81 mg Oral Daily   azelastine  1 spray Each Nare BID   clopidogrel  75 mg Oral Daily   DULoxetine  20 mg Oral Q breakfast   enoxaparin (LOVENOX) injection  0.5 mg/kg Subcutaneous Q24H   FLUoxetine  40 mg Oral BID   insulin aspart  0-15 Units Subcutaneous TID WC   insulin aspart  0-5 Units Subcutaneous QHS   insulin glargine-yfgn  100 Units Subcutaneous QHS   ipratropium-albuterol  3 mL Nebulization TID   isosorbide mononitrate  30 mg Oral Daily   levothyroxine  125 mcg Oral Q0600   losartan  25 mg Oral Q breakfast   pantoprazole  40 mg Oral Q breakfast   traZODone  200 mg Oral QHS    Family History    Family History  Problem Relation Age of Onset  Breast cancer Mother 41   Hypertension Father    Mesothelioma Father    Asthma Father    Stroke Paternal Grandfather    Heart disease Other    Breast cancer Maternal Aunt    Breast cancer Paternal Aunt    She indicated that her mother is deceased. She indicated that her father is deceased. She indicated that her brother is  deceased. She indicated that her maternal grandmother is deceased. She indicated that her maternal grandfather is deceased. She indicated that her paternal grandmother is deceased. She indicated that her paternal grandfather is deceased. She indicated that the status of her maternal aunt is unknown. She indicated that the status of her paternal aunt is unknown. She indicated that the status of her other is unknown.   Social History    Social History   Socioeconomic History   Marital status: Married    Spouse name: Not on file   Number of children: 3   Years of education: Not on file   Highest education level: Not on file  Occupational History   Occupation: Retired    Fish farm manager: UNEMPLOYED    Comment: CNA  Tobacco Use   Smoking status: Former    Packs/day: 0.50    Years: 30.00    Pack years: 15.00    Types: Cigarettes    Quit date: 10/02/2013    Years since quitting: 7.6   Smokeless tobacco: Never  Vaping Use   Vaping Use: Never used  Substance and Sexual Activity   Alcohol use: No   Drug use: No   Sexual activity: Not Currently  Other Topics Concern   Not on file  Social History Narrative   Does not have Living Will   Desires CPR, would not want prolonged life support if futile.   Social Determinants of Health   Financial Resource Strain: Not on file  Food Insecurity: Not on file  Transportation Needs: Not on file  Physical Activity: Not on file  Stress: Not on file  Social Connections: Not on file  Intimate Partner Violence: Not on file     Review of Systems    General:  No chills, fever, night sweats or weight changes.  Cardiovascular:  +++ Occasional chest pain, +++ dyspnea on exertion, +++ left ankle edema, no orthopnea, palpitations, paroxysmal nocturnal dyspnea. Dermatological: No rash, lesions/masses Respiratory: +++ Chronic cough her husband, +++ dyspnea Urologic: No hematuria, dysuria Abdominal:   No nausea, vomiting, diarrhea, bright red blood per  rectum, melena, or hematemesis Neurologic:  No visual changes, wkns, changes in mental status. All other systems reviewed and are otherwise negative except as noted above.  Physical Exam    Blood pressure (!) 107/57, pulse 84, temperature 98.3 F (36.8 C), temperature source Oral, resp. rate (!) 27, SpO2 96 %.  General: Pleasant, obese, NAD Psych: Flat affect. Neuro: Alert and oriented X 2 - disoriented to place. Moves all extremities spontaneously. HEENT: Normal  Neck: Supple, obese, no obvious jvp, though difficult to gauge due to body habitus. No bruits. Lungs:  Resp regular and unlabored, Diminished breath sounds bilat w/ diffuse crackles, and occas exp wheeze. Heart: RRR no s3, s4, 3/6 SEM loudest @ USB but heard throughout. Abdomen:  Obese, soft, non-tender, non-distended, BS + x 4.  Extremities: No clubbing, cyanosis.  Trace L ankle edema. DP/PT2+, Radials 2+ and equal bilaterally.  Labs    Cardiac Enzymes Recent Labs  Lab 05/21/21 0205 05/30/2021 1808 05/24/2021 2118  TROPONINIHS 39* 30* 34*  Lab Results  Component Value Date   WBC 12.1 (H) 06/04/2021   HGB 7.4 (L) 06/04/2021   HCT 25.0 (L) 06/04/2021   MCV 81.7 06/04/2021   PLT 216 06/04/2021    Recent Labs  Lab 06/04/21 0744  NA 134*  K 4.3  CL 96*  CO2 26  BUN 29*  CREATININE 1.58*  CALCIUM 8.0*  GLUCOSE 195*   Lab Results  Component Value Date   CHOL 173 09/20/2017   HDL 22 (L) 09/20/2017   LDLCALC 110 (H) 09/20/2017   TRIG 207 (H) 09/20/2017   Lab Results  Component Value Date   DDIMER 2.02 (H) 05/13/2021     Radiology Studies    CT Angio Chest Pulmonary Embolism (PE) W or WO Contrast  Result Date: 06/04/2021 CLINICAL DATA:  Pulmonary embolism (PE) suspected, positive D-dimer EXAM: CT ANGIOGRAPHY CHEST WITH CONTRAST TECHNIQUE: Multidetector CT imaging of the chest was performed using the standard protocol during bolus administration of intravenous contrast. Multiplanar CT image  reconstructions and MIPs were obtained to evaluate the vascular anatomy. CONTRAST:  17mL OMNIPAQUE IOHEXOL 350 MG/ML SOLN COMPARISON:  02/01/2021 FINDINGS: Cardiovascular: Adequate opacification of the pulmonary arterial tree. No intraluminal filling defect identified to suggest acute pulmonary embolism. Central pulmonary arteries are enlarged in keeping with changes of pulmonary arterial hypertension, unchanged. Transcatheter aortic valve replacement has been performed. Multi-vessel coronary artery stenting has been performed as well as coronary artery bypass grafting. Cardiomegaly is present, stable. No pericardial effusion. Extensive atheromatous plaque is seen within the thoracic aorta demonstrating mural irregularity and numerous shallow ulcer like projections. No aortic aneurysm. Mediastinum/Nodes: The thyroid gland is absent or atretic. The esophagus is unremarkable. There is interval enlargement of multiple pathologically enlarged lymph nodes within the right paratracheal and prevascular lymph node groups which measure up to 19 mm in short axis diameter. Numerous shotty bilateral hilar lymph nodes are also identified. Lungs/Pleura: Extensive bilateral asymmetric ground-glass pulmonary infiltrate has developed, most in keeping with atypical infection in the acute setting. No pneumothorax or pleural effusion. No central obstructing lesion. Upper Abdomen: No acute abnormality. Musculoskeletal: No acute bone abnormality. No lytic or blastic bone lesion. Review of the MIP images confirms the above findings. IMPRESSION: No pulmonary embolism. Extensive multifocal pulmonary infiltrate most in keeping with atypical infection in the acute setting. Stable cardiomegaly. Aortic Atherosclerosis (ICD10-I70.0). Electronically Signed   By: Fidela Salisbury M.D.   On: 06/04/2021 00:04   US Venous Img Lower Unilateral Left  Result Date: 05/30/2021 CLINICAL DATA:  Initial evaluation for acute swelling of left calf, evaluate  for DVT. EXAM: Left LOWER EXTREMITY VENOUS DOPPLER ULTRASOUND TECHNIQUE: Gray-scale sonography with graded compression, as well as color Doppler and duplex ultrasound were performed to evaluate the lower extremity deep venous systems from the level of the common femoral vein and including the common femoral, femoral, profunda femoral, popliteal and calf veins including the posterior tibial, peroneal and gastrocnemius veins when visible. The superficial great saphenous vein was also interrogated. Spectral Doppler was utilized to evaluate flow at rest and with distal augmentation maneuvers in the common femoral, femoral and popliteal veins. COMPARISON:  None. FINDINGS: Contralateral Common Femoral Vein: Respiratory phasicity is normal and symmetric with the symptomatic side. No evidence of thrombus. Normal compressibility. Common Femoral Vein: No evidence of thrombus. Normal compressibility, respiratory phasicity and response to augmentation. Saphenofemoral Junction: No evidence of thrombus. Normal compressibility and flow on color Doppler imaging. Profunda Femoral Vein: No evidence of thrombus. Normal compressibility and flow on color  Doppler imaging. Femoral Vein: No evidence of thrombus. Normal compressibility, respiratory phasicity and response to augmentation. Popliteal Vein: No evidence of thrombus. Normal compressibility, respiratory phasicity and response to augmentation. Calf Veins: No evidence of thrombus. Normal compressibility and flow on color Doppler imaging. Superficial Great Saphenous Vein: No evidence of thrombus. Normal compressibility. Venous Reflux:  None. Other Findings:  None. IMPRESSION: No evidence of deep venous thrombosis. Electronically Signed   By: Jeannine Boga M.D.   On: 05/10/2021 19:41   DG Chest Port 1 View  Result Date: 05/31/2021 CLINICAL DATA:  Pt to ED via [facility] from home for shortness of breath. Pt speaking in complete sentences stating that she cannot breath. EXAM:  PORTABLE CHEST 1 VIEW COMPARISON:  05/21/2021 FINDINGS: Stable changes from prior cardiac surgery and aortic valve replacement. Cardiac silhouette is enlarged. No mediastinal or hilar masses. Bilateral interstitial and patchy hazy airspace lung opacities. Interstitial opacities are similar to the prior exam, airspace opacities appear new. No convincing pleural effusion.  No pneumothorax. Skeletal structures are grossly intact. IMPRESSION: 1. Interstitial and hazy airspace lung opacities. Congestive heart failure with pulmonary edema suspected. Multifocal pneumonia is also in the differential diagnosis. Electronically Signed   By: Lajean Manes M.D.   On: 05/18/2021 18:42    ECG & Cardiac Imaging    Sinus rhythm, 96, 1st deg AVB, PACs baseline artifact, TWI I/aVL - personally reviewed.  Assessment & Plan    1.  Acute on chronic respiratory failure/COPD/pneumonia: Patient with a history of COPD on home O2 and chronic dyspnea.  Per husband, over the past week, she has had worsening dyspnea at rest and with minimal activity, prompting EMS evaluation on the 31st with subsequent transfer to [facility].  Here, she has been found to have pneumonia and is currently on antibiotics.  An echocardiogram was performed and showed normal LV function and normal functioning aortic valve prosthesis.  Her BNP was elevated @323 .4 and she has received 3 doses of Lasix since arrival.  BUN and creatinine have risen with diuretic therapy and I will hold.  2.  Aortic stenosis status post valve in valve TAVR: Patient with prior history of bioprosthetic aortic valve replacement in 2015 with subsequent development of severe bioprosthetic aortic valve stenosis status post TAVR in November.  Post procedure, she had elevated gradients in the setting of valve in valve and patient-prosthesis mismatch.  This was seen both on TEE and follow-up echo December 14.  She did have an echo earlier today which shows normal functioning aortic valve  prosthesis.  Gradients are recorded as much lower than previously documented.  No further evaluation of aortic valve prosthesis warranted at this time.  Continue aspirin and Plavix.  3.  Coronary artery disease/elevated troponin: Status post prior CABG x2 with subsequent stenting of the vein graft to the RCA x2 in June as well as stenting of the native left circumflex x1 in June.  She had stable anatomy on catheterization in September.  In the setting of #1, she has mild troponin elevation with flat trend at 30  34.  ECG unremarkable.  Currently symptom-free.  Suspect demand ischemia in the setting of respiratory failure pneumonia.  Continue aspirin, Plavix, nitrate.  She is not on statin due to prior intolerances.  4.  Essential hypertension: Blood pressure stable.  Continue home doses of ARB and nitrate.  5.  Hyperlipidemia: Statin/Zetia intolerant.  LDL of 127 in August.  Will consider PCSK9 inhibitor in the outpatient setting.  She only recently started  following with our team post TAVR.  6.  Type 2 diabetes mellitus: Insulin management per medicine team.  7.  Normocytic anemia: H&H lower since last evaluation-7.4 and 25 at this time.  Given cardiac history, would have a low threshold to transfuse.  8.  Stage III chronic kidney disease: Creatinine slightly up from baseline with diuresis.  Hold Lasix.  Follow.  Signed, Murray Hodgkins, NP 06/05/2021, 12:24 PM  For questions or updates, please contact   Please consult [email or web address] for contact info under Cardiology/STEMI.    Dyspnea, COPD O2 dependent, acute pneumonia  Aortic stenosis, s/p AVR 2015 TAVR VIV 11/22  Mitral stenosis  Anemia  ( [place])\  CAD s/p CABG  Dementia/panic disorder  Statin Intolerance    Pts dyspnea seems to be non cardiac--LV function and TAVR  normal; and no further evaluation is necessary  Elevated BNP but the specificity of which is attenuated by age and ageewith above to old  diuretics  Anemia is iron replete -- she was scheduled to receive Fe infusion therapy but not sure I understand that , and have to defer to those smarter than I-- I would transfuse for her Hgb < 8  We will be available for further questions

## 2021-06-05 NOTE — ED Notes (Signed)
Pt brief and chux changed at this time.

## 2021-06-05 NOTE — TOC Initial Note (Signed)
Transition of Care [facility]) - Initial/Assessment Note    Patient Details  Name: [name] MRN: [id number] Date of Birth: 09/08/1945  Transition of [facility]) CM/SW Contact:    Shelbie Hutching, RN Phone Number: 06/05/2021, 6:26 PM  Clinical Narrative:                 Patient being admitted to the hospital for shortness of breath, COPD exacerbation.  Wears chronic O2 at home.  Patient is from home with her husband.  TOC consult acknowledged.  PT and OT consults are pending, RNCM will meet with patient at the bedside once PT and OT have made recommendations.    Expected Discharge Plan: Skilled Nursing Facility Barriers to Discharge: Continued Medical Work up   Patient Goals and CMS Choice   CMS Medicare.gov Compare Post Acute Care list provided to:: Patient Choice offered to / list presented to : Patient, Spouse  Expected Discharge Plan and Services Expected Discharge Plan: [place]   Discharge Planning Services: CM Consult Post Acute Care Choice: [place] Living arrangements for the past 2 months: Single Family Home                 DME Arranged: N/A DME Agency: NA       HH Arranged: NA HH Agency: NA        Prior Living Arrangements/Services Living arrangements for the past 2 months: Single Family Home Lives with:: Spouse Patient language and need for interpreter reviewed:: Yes Do you feel safe going back to the place where you live?: Yes      Need for Family Participation in Patient Care: Yes (Comment) Care giver support system in place?: Yes (comment) Current home services: DME Criminal Activity/Legal Involvement Pertinent to Current Situation/Hospitalization: No - Comment as needed  Activities of Daily Living      Permission Sought/Granted Permission sought to share information with : Case Manager, Family Supports Permission granted to share information with : Yes, Verbal Permission Granted  Share Information with NAME: Zeva Leber     Permission granted to share info w Relationship: spouse  Permission granted to share info w Contact Information: [phone]  Emotional Assessment         Alcohol / Substance Use: Not Applicable Psych Involvement: No (comment)  Admission diagnosis:  Shortness of breath [R06.02] Patient Active Problem List   Diagnosis Date Noted   Shortness of breath 05/25/2021   Severe aortic stenosis 04/18/2021   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   Acute on chronic diastolic (congestive) heart failure ([place]) 11/24/2020   SOB (shortness of breath) 11/24/2020   Acute hyponatremia 11/24/2020   Prolonged QT interval 11/24/2020   Angina pectoris ([place]) 11/03/2020   CHF exacerbation ([place]) 09/07/2020   Elevated troponin 09/07/2020   Chest pain 08/09/2020   Acute on chronic diastolic CHF (congestive heart failure) ([place]) 08/08/2020   Anemia due to stage 3a chronic kidney disease ([place]) 05/21/2019   IDA (iron deficiency anemia) 02/03/2019   COPD exacerbation ([place]) 12/28/2017   Depression, major, single episode, complete remission ([place]) 04/11/2017   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   Aortic ectasia, abdominal ([place]) 01/01/2017   GAD (generalized anxiety disorder) 09/26/2016   Acute posthemorrhagic anemia 08/24/2016   Acute respiratory failure with hypoxia ([place]) 93/81/8299   Acute diastolic CHF (congestive heart failure) ([place]) 08/24/2016   Hyponatremia 08/24/2016   Leukocytosis 08/24/2016   Fever 08/24/2016   Diarrhea 08/24/2016   Generalized weakness 08/24/2016  Acute diverticulitis 08/16/2016   Gastrointestinal hemorrhage    Ataxia 08/09/2016   Dizziness 08/09/2016   Staggering gait 08/09/2016   AVB (atrioventricular block)    Atypical chest pain    Unstable angina ([place]) 05/25/2016   Palpitations 05/25/2016   PUD - recent Rx for H.Pylori 05/25/2016   Stenosis of coronary artery stent    Recurrent major depressive disorder, in partial  remission ([place]) 07/29/2015   Sialadenitis 04/25/2015   Viral URI 03/24/2015   Hypoglycemia 08/25/2014   Nausea without vomiting 08/25/2014   Dyslipidemia-statin ontol    Long-term insulin use ([place]) 03/31/2014   Aortic valve disorder 11/11/2013   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 10/12/2013   Coronary Artery Disease s/p CABG 10/2013 10/10/2013   Other malaise and fatigue 09/11/2013   Alopecia 03/06/2013   Insomnia 02/06/2013   Gastroesophageal reflux disease without esophagitis 01/19/2013   Dysphagia, unspecified(787.20) 01/19/2013   Dyspnea 11/17/2012   Weakness 11/17/2012   Abnormal MRI of head 09/05/2012   Depression with anxiety 03/21/2012   Extrinsic asthma 11/12/2011   Anemia 06/07/2011   Chronic obstructive pulmonary disease ([place]) 01/25/2010   Acquired hypothyroidism 08/10/2009   MICROSCOPIC HEMATURIA 05/07/2009   GOITER 03/02/2009   Essential hypertension 01/06/2009   MEMORY LOSS 06/23/2008   Obstructive sleep apnea 12/25/2007   DM type 2 (diabetes mellitus, type 2) ([place]) 09/09/2006   ALLERGIC RHINITIS 09/09/2006   DIVERTICULOSIS, COLON 09/09/2006   PCP:  Rusty Aus, MD Pharmacy:   [facility] [address], [place] - [place] [address] Phone: [phone] Fax: [phone]     Social Determinants of Health (SDOH) Interventions    Readmission Risk Interventions Readmission Risk Prevention Plan 02/11/2021 01/31/2021 09/08/2020  Transportation Screening Complete - Complete  PCP or Specialist Appt within 3-5 Days - - Complete  HRI or Home Care Consult - - Complete  Palliative Care Screening - - Not Applicable  Medication Review (RN Care Manager) Complete - Complete  PCP or Specialist appointment within 3-5 days of discharge Complete Complete -  [place] or Home Care Consult Patient refused Patient refused -  SW Recovery Care/Counseling Consult Complete - -  Palliative Care Screening Complete Not Applicable -  [place] Not  Applicable Not Applicable -  Some recent data might be hidden

## 2021-06-05 NOTE — Progress Notes (Signed)
PROGRESS NOTE    [name]  ERD:[id number] DOB: [birth year] DOA: 05/28/2021 PCP: Rusty Aus, MD   Brief Narrative:  [name] is a 76 y.o. 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  Patient presents with acute on chronic malaise weakness dyspnea with exertion now having profound dyspnea even at rest.  Patient's husband at bedside indicates that after her recent surgery and TAVR in November she has had worsening weakness, has essentially become bed bound and wheelchair dependent, she can ambulate very short distances, up to 10 feet but have become so taxing on her over the past few weeks that she essentially chooses not to ambulate at all at this point.  Given patient's severe acute hypoxia, malaise weakness elevated BNP and D-dimer hospitalist was called for admission, cardiology requested for consult.  Assessment & Plan:    Acute hypoxic respiratory failure, multifactorial, POA Rule out atypical pneumonia/COPD exacerbation  Less likely cardiogenic etiology -Cardiology consulted given recent TAVR, echocardiogram - appropriate TAVR and LV function.  Cardiology indicate her respiratory status is likely not cardiac in nature which is reassuring.  Recommend holding diuretics in the setting of increased creatinine. -Chest x-ray shows diffuse bilateral infiltrate concerning for viral etiology although COVID and flu remain negative we will treat  supportively, continue antibiotics (ceftriaxone and azithromycin) for possible underlying bacterial pneumonia likely exacerbating baseline COPD -Continue to wean oxygen back to baseline(room air) -D-dimer slightly elevated but CTA negative for PE, DVT studies negative as well  Ambulatory dysfunction, frequent falls, left knee ecchymosis, POA -PT OT to follow once medically stable for ambulation -Left knee imaging unremarkable for fracture  Insulin-dependent diabetes mellitus type 2 -Continue sliding scale insulin, hypoglycemic protocol  -Home insulin 100 units at bedtime, will continue as long as patient's appetite remains appropriate, will discontinue if she continues to have poor p.o. intake as described by husband   Depression, Generalized anxiety disorder, Anxiety - Resume home duloxetine 20 mg every morning, fluoxetine 40 mg twice daily; Ativan 0.5 mg every 8 hours as needed for anxiety  Insomnia-resumed home trazodone 200 mg nightly Hypothyroid-resumed home levothyroxine 125 mcg daily GERD-PPI  DVT prophylaxis: Lovenox Code Status: DNR Family Communication: Husband at bedside  Status is: Inpatient  Dispo: The patient is from: Home              Anticipated d/c is to: To be determined -likely SNF given profound dyspnea with minimal exertion.              Anticipated d/c date is: 72+ hours              Patient currently not medically stable for discharge  Consultants:  Cardiology  Procedures:  None planned  Antimicrobials:  Azithromycin, ceftriaxone x5 days  Subjective: No acute issues or events overnight, patient much more awake alert oriented this morning compared to previous, not yet back to baseline per husband but patient remains quite anxious requesting discharge which we discussed was not only unsafe but otherwise given her extremely high  risk for morbidity mortality should she continue to have respiratory status requiring supplemental oxygen well above baseline and  profound dyspnea with even minimal exertion getting out of bed much less with ambulation.  Patient continues to request discharge home, unclear if she truly understands her prognosis at this point.  Objective: Vitals:   06/04/21 1925 06/04/21 2201 06/05/21 0108 06/05/21 0644  BP:  130/64 (!) 120/99 (!) 142/87  Pulse:  89 92 90  Resp:  20 (!) 22 (!) 21  Temp:      TempSrc:      SpO2: 100% 100% 100% 98%   No intake or output data in the 24 hours ending 06/05/21 0749  There were no vitals filed for this visit.  Examination:  General: Awake alert oriented x3 HEENT:  Normocephalic atraumatic.  Sclerae nonicteric, noninjected.  Extraocular movements intact bilaterally. Neck:  Without mass or deformity.  Trachea is midline. Lungs: Markedly diminished with diffuse expiratory wheeze and scant bibasilar rales Heart:  Regular rate and rhythm.  Without murmurs, rubs, or gallops. Abdomen:  Soft, nontender, nondistended.  Without guarding or rebound. Extremities: Without cyanosis, clubbing, edema, or obvious deformity. Vascular:  Dorsalis pedis and posterior tibial pulses palpable bilaterally. Skin:  Warm and dry, no erythema, no ulcerations.  Data Reviewed: I have personally reviewed following labs and imaging studies  CBC: Recent Labs  Lab 05/31/2021 1808 06/04/21 0744  WBC 14.6* 12.1*  HGB 7.5* 7.4*  HCT 24.9* 25.0*  MCV 82.5 81.7  PLT 213 850    Basic Metabolic Panel: Recent Labs  Lab 05/27/2021 1808 06/04/21 0744  NA 134* 134*  K 3.7 4.3  CL 99 96*  CO2 25 26  GLUCOSE 120* 195*  BUN 28* 29*  CREATININE 1.39* 1.58*  CALCIUM 7.8* 8.0*    GFR: Estimated Creatinine Clearance: 35.1 mL/min (A) (by C-G formula based on SCr of 1.58 mg/dL (H)). Liver Function Tests: No results for input(s): AST, ALT, ALKPHOS, BILITOT, PROT, ALBUMIN in the last 168 hours. No results for input(s): LIPASE, AMYLASE in the last 168 hours. No results for input(s): AMMONIA in the last 168  hours. Coagulation Profile: No results for input(s): INR, PROTIME in the last 168 hours. Cardiac Enzymes: No results for input(s): CKTOTAL, CKMB, CKMBINDEX, TROPONINI in the last 168 hours. BNP (last 3 results) No results for input(s): PROBNP in the last 8760 hours. HbA1C: No results for input(s): HGBA1C in the last 72 hours. CBG: Recent Labs  Lab 05/10/2021 2124 06/04/21 0739 06/04/21 1218 06/04/21 1651 06/04/21 2117  GLUCAP 153* 214* 245* 280* 410*    Lipid Profile: No results for input(s): CHOL, HDL, LDLCALC, TRIG, CHOLHDL, LDLDIRECT in the last 72 hours. Thyroid Function Tests: No results for input(s): TSH, T4TOTAL, FREET4, T3FREE, THYROIDAB in the last 72 hours. Anemia Panel: No results for input(s): VITAMINB12, FOLATE, FERRITIN, TIBC, IRON, RETICCTPCT in the last 72 hours. Sepsis Labs: Recent Labs  Lab 05/13/2021 1808  PROCALCITON 0.17     Recent Results (from the past 240 hour(s))  Resp Panel by RT-PCR (Flu A&B, Covid) Nasopharyngeal Swab     Status: None   Collection Time: 06/04/21  8:32 AM   Specimen: Nasopharyngeal Swab; Nasopharyngeal(NP) swabs in vial transport medium  Result Value Ref Range Status   SARS Coronavirus 2 by RT PCR NEGATIVE NEGATIVE Final    Comment: (NOTE) SARS-CoV-2 target nucleic acids are NOT DETECTED.  The SARS-CoV-2 RNA is generally detectable in upper respiratory specimens during the acute phase of infection. The lowest concentration  of SARS-CoV-2 viral copies this assay can detect is 138 copies/mL. A negative result does not preclude SARS-Cov-2 infection and should not be used as the sole basis for treatment or other patient management decisions. A negative result may occur with  improper specimen collection/handling, submission of specimen other than nasopharyngeal swab, presence of viral mutation(s) within the areas targeted by this assay, and inadequate number of viral copies(<138 copies/mL). A negative result must be combined  with clinical observations, patient history, and epidemiological information. The expected result is Negative.  Fact Sheet for Patients:  EntrepreneurPulse.com.au  Fact Sheet for Healthcare Providers:  IncredibleEmployment.be  This test is no t yet approved or cleared by the [place] FDA and  has been authorized for detection and/or diagnosis of SARS-CoV-2 by FDA under an Emergency Use Authorization (EUA). This EUA will remain  in effect (meaning this test can be used) for the duration of the COVID-19 declaration under Section 564(b)(1) of the Act, 21 U.S.C.section 360bbb-3(b)(1), unless the authorization is terminated  or revoked sooner.       Influenza A by PCR NEGATIVE NEGATIVE Final   Influenza B by PCR NEGATIVE NEGATIVE Final    Comment: (NOTE) The Xpert Xpress SARS-CoV-2/FLU/RSV plus assay is intended as an aid in the diagnosis of influenza from Nasopharyngeal swab specimens and should not be used as a sole basis for treatment. Nasal washings and aspirates are unacceptable for Xpert Xpress SARS-CoV-2/FLU/RSV testing.  Fact Sheet for Patients: EntrepreneurPulse.com.au  Fact Sheet for Healthcare Providers: IncredibleEmployment.be  This test is not yet approved or cleared by the [place] FDA and has been authorized for detection and/or diagnosis of SARS-CoV-2 by FDA under an Emergency Use Authorization (EUA). This EUA will remain in effect (meaning this test can be used) for the duration of the COVID-19 declaration under Section 564(b)(1) of the Act, 21 U.S.C. section 360bbb-3(b)(1), unless the authorization is terminated or revoked.  Performed at [facility], [address], [place], [place] [address]      Radiology Studies: CT Angio Chest Pulmonary Embolism (PE) W or WO Contrast  Result Date: 06/04/2021 CLINICAL DATA:  Pulmonary embolism (PE) suspected, positive D-dimer  EXAM: CT ANGIOGRAPHY CHEST WITH CONTRAST TECHNIQUE: Multidetector CT imaging of the chest was performed using the standard protocol during bolus administration of intravenous contrast. Multiplanar CT image reconstructions and MIPs were obtained to evaluate the vascular anatomy. CONTRAST:  19mL OMNIPAQUE IOHEXOL 350 MG/ML SOLN COMPARISON:  02/01/2021 FINDINGS: Cardiovascular: Adequate opacification of the pulmonary arterial tree. No intraluminal filling defect identified to suggest acute pulmonary embolism. Central pulmonary arteries are enlarged in keeping with changes of pulmonary arterial hypertension, unchanged. Transcatheter aortic valve replacement has been performed. Multi-vessel coronary artery stenting has been performed as well as coronary artery bypass grafting. Cardiomegaly is present, stable. No pericardial effusion. Extensive atheromatous plaque is seen within the thoracic aorta demonstrating mural irregularity and numerous shallow ulcer like projections. No aortic aneurysm. Mediastinum/Nodes: The thyroid gland is absent or atretic. The esophagus is unremarkable. There is interval enlargement of multiple pathologically enlarged lymph nodes within the right paratracheal and prevascular lymph node groups which measure up to 19 mm in short axis diameter. Numerous shotty bilateral hilar lymph nodes are also identified. Lungs/Pleura: Extensive bilateral asymmetric ground-glass pulmonary infiltrate has developed, most in keeping with atypical infection in the acute setting. No pneumothorax or pleural effusion. No central obstructing lesion. Upper Abdomen: No acute abnormality. Musculoskeletal: No acute bone abnormality. No lytic or blastic bone lesion. Review of the MIP  images confirms the above findings. IMPRESSION: No pulmonary embolism. Extensive multifocal pulmonary infiltrate most in keeping with atypical infection in the acute setting. Stable cardiomegaly. Aortic Atherosclerosis (ICD10-I70.0).  Electronically Signed   By: Fidela Salisbury M.D.   On: 06/04/2021 00:04   US Venous Img Lower Unilateral Left  Result Date: 05/29/2021 CLINICAL DATA:  Initial evaluation for acute swelling of left calf, evaluate for DVT. EXAM: Left LOWER EXTREMITY VENOUS DOPPLER ULTRASOUND TECHNIQUE: Gray-scale sonography with graded compression, as well as color Doppler and duplex ultrasound were performed to evaluate the lower extremity deep venous systems from the level of the common femoral vein and including the common femoral, femoral, profunda femoral, popliteal and calf veins including the posterior tibial, peroneal and gastrocnemius veins when visible. The superficial great saphenous vein was also interrogated. Spectral Doppler was utilized to evaluate flow at rest and with distal augmentation maneuvers in the common femoral, femoral and popliteal veins. COMPARISON:  None. FINDINGS: Contralateral Common Femoral Vein: Respiratory phasicity is normal and symmetric with the symptomatic side. No evidence of thrombus. Normal compressibility. Common Femoral Vein: No evidence of thrombus. Normal compressibility, respiratory phasicity and response to augmentation. Saphenofemoral Junction: No evidence of thrombus. Normal compressibility and flow on color Doppler imaging. Profunda Femoral Vein: No evidence of thrombus. Normal compressibility and flow on color Doppler imaging. Femoral Vein: No evidence of thrombus. Normal compressibility, respiratory phasicity and response to augmentation. Popliteal Vein: No evidence of thrombus. Normal compressibility, respiratory phasicity and response to augmentation. Calf Veins: No evidence of thrombus. Normal compressibility and flow on color Doppler imaging. Superficial Great Saphenous Vein: No evidence of thrombus. Normal compressibility. Venous Reflux:  None. Other Findings:  None. IMPRESSION: No evidence of deep venous thrombosis. Electronically Signed   By: Jeannine Boga M.D.    On: 05/29/2021 19:41   DG Chest Port 1 View  Result Date: 05/29/2021 CLINICAL DATA:  Pt to ED via [facility] from home for shortness of breath. Pt speaking in complete sentences stating that she cannot breath. EXAM: PORTABLE CHEST 1 VIEW COMPARISON:  05/21/2021 FINDINGS: Stable changes from prior cardiac surgery and aortic valve replacement. Cardiac silhouette is enlarged. No mediastinal or hilar masses. Bilateral interstitial and patchy hazy airspace lung opacities. Interstitial opacities are similar to the prior exam, airspace opacities appear new. No convincing pleural effusion.  No pneumothorax. Skeletal structures are grossly intact. IMPRESSION: 1. Interstitial and hazy airspace lung opacities. Congestive heart failure with pulmonary edema suspected. Multifocal pneumonia is also in the differential diagnosis. Electronically Signed   By: Lajean Manes M.D.   On: 05/22/2021 18:42   ECHOCARDIOGRAM COMPLETE  Result Date: 06/04/2021    ECHOCARDIOGRAM REPORT   Patient Name:   ALEXARAE OLIVA Date of Exam: 06/04/2021 Medical Rec #:  [id number]     Height:       66.0 in Accession #:    [id number]    Weight:       208.8 lb Date of Birth:  09-30-1945      BSA:          2.037 m Patient Age:    22 years      BP:           151/77 mmHg Patient Gender: F             HR:           86 bpm. Exam Location:  [facility] Procedure: 2D Echo and Intracardiac Opacification Agent Indications:     Dyspnea R06.00  History:  Patient has prior history of Echocardiogram examinations, most                  recent 05/17/2021.                  Aortic Valve: bioprosthetic valve is present in the aortic                  position.  Sonographer:     Kathlen Brunswick RDCS Referring Phys:  [id number] AMY N COX Diagnosing Phys: Kate Sable MD  Sonographer Comments: Technically challenging study due to limited acoustic windows, Technically difficult study due to poor echo windows and no apical window. Image acquisition challenging due to patient body  habitus. IMPRESSIONS  1. Left ventricular ejection fraction, by estimation, is 55 to 60%. The left ventricle has normal function. Left ventricular endocardial border not optimally defined to evaluate regional wall motion. There is moderate left ventricular hypertrophy. Left ventricular diastolic parameters are consistent with Grade II diastolic dysfunction (pseudonormalization).  2. Right ventricular systolic function is normal. The right ventricular size is not well visualized.  3. Left atrial size was mildly dilated.  4. A small pericardial effusion is present. The pericardial effusion is posterior and lateral to the left ventricle. There is no evidence of cardiac tamponade.  5. The mitral valve is degenerative. Trivial mitral valve regurgitation. Mild to moderate mitral stenosis. The mean mitral valve gradient is 8.0 mmHg.  6. The aortic valve has been repaired/replaced. Aortic valve regurgitation is not visualized. There is a bioprosthetic valve present in the aortic position.  7. Aortic dilatation noted. There is mild dilatation of the ascending aorta, measuring 40 mm. FINDINGS  Left Ventricle: Left ventricular ejection fraction, by estimation, is 55 to 60%. The left ventricle has normal function. Left ventricular endocardial border not optimally defined to evaluate regional wall motion. Definity contrast agent was given IV to delineate the left ventricular endocardial borders. The left ventricular internal cavity size was normal in size. There is moderate left ventricular hypertrophy. Left ventricular diastolic parameters are consistent with Grade II diastolic dysfunction (pseudonormalization). Right Ventricle: The right ventricular size is not well visualized. No increase in right ventricular wall thickness. Right ventricular systolic function is normal. Left Atrium: Left atrial size was mildly dilated. Right Atrium: Right atrial size was not well visualized. Pericardium: A small pericardial effusion is  present. The pericardial effusion is posterior and lateral to the left ventricle. There is no evidence of cardiac tamponade. Mitral Valve: The mitral valve is degenerative in appearance. Mild to moderate mitral annular calcification. Trivial mitral valve regurgitation. Mild to moderate mitral valve stenosis. MV peak gradient, 14.3 mmHg. The mean mitral valve gradient is 8.0 mmHg. Tricuspid Valve: The tricuspid valve is normal in structure. Tricuspid valve regurgitation is mild. Aortic Valve: The aortic valve has been repaired/replaced. Aortic valve regurgitation is not visualized. Aortic valve mean gradient measures 2.0 mmHg. Aortic valve peak gradient measures 4.1 mmHg. Aortic valve area, by VTI measures 4.24 cm. There is a bioprosthetic valve present in the aortic position. Pulmonic Valve: The pulmonic valve was normal in structure. Pulmonic valve regurgitation is mild. Aorta: Aortic dilatation noted. There is mild dilatation of the ascending aorta, measuring 40 mm. Venous: The inferior vena cava was not well visualized. IAS/Shunts: No atrial level shunt detected by color flow Doppler.  LEFT VENTRICLE PLAX 2D LVIDd:         4.55 cm      Diastology LVIDs:  3.15 cm      LV e' medial:    5.11 cm/s LV PW:         1.65 cm      LV E/e' medial:  31.3 LV IVS:        1.75 cm      LV e' lateral:   7.51 cm/s LVOT diam:     1.70 cm      LV E/e' lateral: 21.3 LV SV:         54 LV SV Index:   27 LVOT Area:     2.27 cm  LV Volumes (MOD) LV vol d, MOD A2C: 84.3 ml LV vol d, MOD A4C: 123.0 ml LV vol s, MOD A2C: 24.4 ml LV vol s, MOD A4C: 31.7 ml LV SV MOD A2C:     59.9 ml LV SV MOD A4C:     123.0 ml LV SV MOD BP:      79.1 ml LEFT ATRIUM           Index LA diam:      4.70 cm 2.31 cm/m LA Vol (A4C): 74.9 ml 36.77 ml/m  AORTIC VALVE                    PULMONIC VALVE AV Area (Vmax):    2.87 cm     PV Vmax:          1.32 m/s AV Area (Vmean):   2.86 cm     PV Peak grad:     7.0 mmHg AV Area (VTI):     4.24 cm     PR End  Diast Vel: 6.66 msec AV Vmax:           101.35 cm/s AV Vmean:          64.200 cm/s AV VTI:            0.128 m AV Peak Grad:      4.1 mmHg AV Mean Grad:      2.0 mmHg LVOT Vmax:         128.00 cm/s LVOT Vmean:        80.900 cm/s LVOT VTI:          0.239 m LVOT/AV VTI ratio: 1.87  AORTA Ao Root diam: 2.90 cm Ao Asc diam:  4.00 cm MITRAL VALVE                TRICUSPID VALVE MV Area (PHT): 3.00 cm     TV Peak grad:   27.6 mmHg MV Area VTI:   1.02 cm     TV Vmax:        2.62 m/s MV Peak grad:  14.3 mmHg MV Mean grad:  8.0 mmHg     SHUNTS MV Vmax:       1.89 m/s     Systemic VTI:  0.24 m MV Vmean:      136.0 cm/s   Systemic Diam: 1.70 cm MV Decel Time: 253 msec MV E velocity: 160.00 cm/s MV A velocity: 173.00 cm/s MV E/A ratio:  0.92 Kate Sable MD Electronically signed by Kate Sable MD Signature Date/Time: 06/04/2021/3:30:30 PM    Final         Scheduled Meds:  aspirin EC  81 mg Oral Daily   azelastine  1 spray Each Nare BID   clopidogrel  75 mg Oral Daily   DULoxetine  20 mg Oral Q breakfast   enoxaparin (LOVENOX) injection  0.5 mg/kg Subcutaneous Q24H  FLUoxetine  40 mg Oral BID   furosemide  40 mg Intravenous Daily   insulin aspart  0-15 Units Subcutaneous TID WC   insulin aspart  0-5 Units Subcutaneous QHS   insulin glargine-yfgn  100 Units Subcutaneous QHS   ipratropium-albuterol  3 mL Nebulization TID   isosorbide mononitrate  30 mg Oral Daily   levothyroxine  125 mcg Oral Q0600   losartan  25 mg Oral Q breakfast   pantoprazole  40 mg Oral Q breakfast   traZODone  200 mg Oral QHS   Continuous Infusions:  azithromycin Stopped (06/04/21 0235)   cefTRIAXone (ROCEPHIN)  IV Stopped (06/05/21 0500)    LOS: 1 day   Time spent: 75min  Dorethea Strubel C Soraida Vickers, DO [facility]  If 7PM-7AM, please contact night-coverage [email or web address]  06/05/2021, 7:49 AM

## 2021-06-06 ENCOUNTER — Telehealth: Payer: Self-pay | Admitting: Oncology

## 2021-06-06 DIAGNOSIS — R0603 Acute respiratory distress: Secondary | ICD-10-CM | POA: Diagnosis not present

## 2021-06-06 DIAGNOSIS — I25118 Atherosclerotic heart disease of native coronary artery with other forms of angina pectoris: Secondary | ICD-10-CM | POA: Diagnosis not present

## 2021-06-06 DIAGNOSIS — I359 Nonrheumatic aortic valve disorder, unspecified: Secondary | ICD-10-CM | POA: Diagnosis not present

## 2021-06-06 DIAGNOSIS — E039 Hypothyroidism, unspecified: Secondary | ICD-10-CM | POA: Diagnosis not present

## 2021-06-06 LAB — GLUCOSE, CAPILLARY
Glucose-Capillary: 129 mg/dL — ABNORMAL HIGH (ref 70–99)
Glucose-Capillary: 139 mg/dL — ABNORMAL HIGH (ref 70–99)
Glucose-Capillary: 190 mg/dL — ABNORMAL HIGH (ref 70–99)
Glucose-Capillary: 268 mg/dL — ABNORMAL HIGH (ref 70–99)
Glucose-Capillary: 381 mg/dL — ABNORMAL HIGH (ref 70–99)

## 2021-06-06 LAB — BASIC METABOLIC PANEL
Anion gap: 8 (ref 5–15)
BUN: 32 mg/dL — ABNORMAL HIGH (ref 8–23)
CO2: 26 mmol/L (ref 22–32)
Calcium: 7.7 mg/dL — ABNORMAL LOW (ref 8.9–10.3)
Chloride: 96 mmol/L — ABNORMAL LOW (ref 98–111)
Creatinine, Ser: 1.58 mg/dL — ABNORMAL HIGH (ref 0.44–1.00)
GFR, Estimated: 34 mL/min — ABNORMAL LOW (ref >60)
Glucose, Bld: 164 mg/dL — ABNORMAL HIGH (ref 70–99)
Potassium: 4.2 mmol/L (ref 3.5–5.1)
Sodium: 130 mmol/L — ABNORMAL LOW (ref 135–145)

## 2021-06-06 LAB — PREPARE RBC (CROSSMATCH)

## 2021-06-06 LAB — CBC
HCT: 22.7 % — ABNORMAL LOW (ref 36.0–46.0)
Hemoglobin: 6.9 g/dL — ABNORMAL LOW (ref 12.0–15.0)
MCH: 24.5 pg — ABNORMAL LOW (ref 26.0–34.0)
MCHC: 30.4 g/dL (ref 30.0–36.0)
MCV: 80.5 fL (ref 80.0–100.0)
Platelets: 194 10*3/uL (ref 150–400)
RBC: 2.82 MIL/uL — ABNORMAL LOW (ref 3.87–5.11)
RDW: 16 % — ABNORMAL HIGH (ref 11.5–15.5)
WBC: 14.3 10*3/uL — ABNORMAL HIGH (ref 4.0–10.5)
nRBC: 0 % (ref 0.0–0.2)

## 2021-06-06 MED ORDER — LORAZEPAM 2 MG/ML IJ SOLN
0.2500 mg | Freq: Once | INTRAMUSCULAR | Status: AC
  Administered 2021-06-06: 0.25 mg via INTRAVENOUS
  Filled 2021-06-06: qty 1

## 2021-06-06 MED ORDER — SODIUM CHLORIDE 0.9% IV SOLUTION: Freq: Once | INTRAVENOUS | Status: AC

## 2021-06-06 NOTE — Telephone Encounter (Signed)
Husband called to reschedule pt's appts. 1-12 &1-16. Pt is in hospital. He will call back to reschedule.

## 2021-06-06 NOTE — Progress Notes (Signed)
OT Cancellation Note  Patient Details Name: [name] MRN: [id number] DOB: 08-22-1945   Cancelled Treatment:    Reason Eval/Treat Not Completed: Medical issues which prohibited therapy. Consult received, chart reviewed. Pt's Hgb noted to be down-trending to 6.9 this morning with order in chart for RBC transfusion.  Per OT guidelines for low Hgb, will hold OT at this time and re-attempt OT evaluation at a later date/time as medically appropriate.    Ardeth Perfect., MPH, MS, OTR/L ascom [phone] 06/06/21, 8:19 AM

## 2021-06-06 NOTE — Progress Notes (Signed)
PROGRESS NOTE    [name]  GGY:[id number] DOB: 08/07/1945 DOA: 05/05/2021 PCP: Rusty Aus, MD   Brief Narrative:  [name] is a 76 y.o. 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  Patient presents with acute on chronic malaise weakness dyspnea with exertion now having profound dyspnea even at rest.  Patient's husband at bedside indicates that after her recent surgery and TAVR in November she has had worsening weakness, has essentially become bed bound and wheelchair dependent, she can ambulate very short distances, up to 10 feet but have become so taxing on her over the past few weeks that she essentially chooses not to ambulate at all at this point.  Given patient's severe acute hypoxia, malaise weakness elevated BNP and D-dimer hospitalist was called for admission, cardiology requested for consult.  Assessment & Plan:   Acute hypoxic respiratory failure, multifactorial, POA Likely atypical pneumonia/COPD exacerbation, POA Rule out cardiogenic etiology -Cardiology consulted given recent TAVR -indicate respiratory status is not likely cardiogenic in nature.  Recommending to hold ongoing diuretics -Echocardiogram - appropriate TAVR and LV function.  -Chest x-ray shows diffuse bilateral infiltrate concerning for viral etiology although COVID and flu remain negative we will treat supportively, continue antibiotics (ceftriaxone and azithromycin)  for possible underlying bacterial pneumonia  -Concurrent COPD exacerbation -Continue to wean oxygen back to baseline(room air) -D-dimer slightly elevated but CTA negative for PE, DVT studies negative as well  Ambulatory dysfunction, frequent falls, left knee ecchymosis, POA -PT OT to follow once medically stable for ambulation -Left knee imaging unremarkable for fracture  Acute symptomatic anemia, ongoing -Baseline iron deficiency anemia with chronic anemia of chronic disease  -Follow for blood loss, FOBT pending -patient reports brown bowel movement over the past 24 hours -Hold Lovenox aspirin -Repeat phlebotomy, acute illness likely provoking chronic anemia -1 unit PRBC transfused on 06/06/2021  Insulin-dependent diabetes mellitus type 2 -Continue sliding scale insulin, hypoglycemic protocol  -Home insulin 100 units at bedtime, will continue as long as patient's appetite remains appropriate  Depression, Generalized anxiety disorder, Anxiety - Resume home duloxetine 20 mg every morning, fluoxetine 40 mg twice daily; Ativan 0.5 mg every 8 hours as needed for anxiety  Insomnia-resumed home trazodone 200 mg nightly Hypothyroid-resumed home levothyroxine 125 mcg daily GERD-PPI  DVT prophylaxis: Lovenox Code Status: DNR Family Communication: Husband at bedside  Status is: Inpatient  Dispo: The patient is from: Home              Anticipated d/c is to: To be determined -likely SNF given profound dyspnea with minimal exertion.              Anticipated d/c date is: 72+ hours              Patient currently not medically stable for discharge  Consultants:  Cardiology  Procedures:  None planned  Antimicrobials:  Azithromycin, ceftriaxone x5 days  Subjective: No acute issues or events overnight, patient much more awake alert oriented this morning compared to previous, not yet back  to baseline per husband but patient remains quite anxious requesting discharge which we discussed was  not only unsafe but otherwise given her extremely high risk for morbidity mortality should she continue to have respiratory status requiring supplemental oxygen well above baseline and profound dyspnea with even minimal exertion getting out of bed much less with ambulation.  Patient continues to request discharge home, unclear if she truly understands her prognosis at this point.  Objective: Vitals:   06/06/21 0209 06/06/21 0233 06/06/21 0517 06/06/21 0723  BP:   (!) 132/54 124/60  Pulse: 85 82 84 80  Resp:   20   Temp:   98.8 F (37.1 C) 98.3 F (36.8 C)  TempSrc:   Oral Oral  SpO2: 97% 100% 96% 100%    Intake/Output Summary (Last 24 hours) at 06/06/2021 0730 Last data filed at 06/06/2021 0557 Gross per 24 hour  Intake 0 ml  Output 425 ml  Net -425 ml    There were no vitals filed for this visit.  Examination:  General: Awake alert oriented x3 HEENT:  Normocephalic atraumatic.  Sclerae nonicteric, noninjected.  Extraocular movements intact bilaterally. Neck:  Without mass or deformity.  Trachea is midline. Lungs: Markedly diminished with diffuse expiratory wheeze and scant bibasilar rales Heart:  Regular rate and rhythm.  Without murmurs, rubs, or gallops. Abdomen:  Soft, nontender, nondistended.  Without guarding or rebound. Extremities: Without cyanosis, clubbing, edema, or obvious deformity. Vascular:  Dorsalis pedis and posterior tibial pulses palpable bilaterally. Skin:  Warm and dry, no erythema, no ulcerations.  Data Reviewed: I have personally reviewed following labs and imaging studies  CBC: Recent Labs  Lab 05/15/2021 1808 06/04/21 0744 06/06/21 0550  WBC 14.6* 12.1* 14.3*  HGB 7.5* 7.4* 6.9*  HCT 24.9* 25.0* 22.7*  MCV 82.5 81.7 80.5  PLT 213 216 063    Basic Metabolic Panel: Recent Labs  Lab 05/19/2021 1808 06/04/21 0744 06/06/21 0550  NA 134* 134* 130*  K 3.7 4.3 4.2  CL 99 96* 96*  CO2 25 26 26   GLUCOSE 120* 195* 164*  BUN 28* 29* 32*  CREATININE  1.39* 1.58* 1.58*  CALCIUM 7.8* 8.0* 7.7*    GFR: Estimated Creatinine Clearance: 35.1 mL/min (A) (by C-G formula based on SCr of 1.58 mg/dL (H)). Liver Function Tests: No results for input(s): AST, ALT, ALKPHOS, BILITOT, PROT, ALBUMIN in the last 168 hours. No results for input(s): LIPASE, AMYLASE in the last 168 hours. No results for input(s): AMMONIA in the last 168 hours. Coagulation Profile: No results for input(s): INR, PROTIME in the last 168 hours. Cardiac Enzymes: No results for input(s): CKTOTAL, CKMB, CKMBINDEX, TROPONINI in the last 168 hours. BNP (last 3 results) No results for input(s): PROBNP in the last 8760 hours. HbA1C: No results for input(s): HGBA1C in the last 72 hours. CBG: Recent Labs  Lab 06/04/21 2117 06/05/21 0752 06/05/21 1125 06/05/21 1642 06/06/21 0022  GLUCAP 410* 153* 229* 229* 190*    Lipid Profile: No results for input(s): CHOL, HDL, LDLCALC, TRIG, CHOLHDL, LDLDIRECT in the last 72 hours. Thyroid Function Tests: No results for input(s): TSH, T4TOTAL, FREET4, T3FREE, THYROIDAB in the last 72 hours. Anemia Panel: No results for input(s): VITAMINB12, FOLATE, FERRITIN, TIBC, IRON, RETICCTPCT in the last 72 hours. Sepsis Labs: Recent Labs  Lab 05/11/2021 1808  PROCALCITON 0.17     Recent Results (from the past 240 hour(s))  Resp Panel by RT-PCR (Flu A&B, Covid) Nasopharyngeal Swab     Status: None   Collection  Time: 06/04/21  8:32 AM   Specimen: Nasopharyngeal Swab; Nasopharyngeal(NP) swabs in vial transport medium  Result Value Ref Range Status   SARS Coronavirus 2 by RT PCR NEGATIVE NEGATIVE Final    Comment: (NOTE) SARS-CoV-2 target nucleic acids are NOT DETECTED.  The SARS-CoV-2 RNA is generally detectable in upper respiratory specimens during the acute phase of infection. The lowest concentration of SARS-CoV-2 viral copies this assay can detect is 138 copies/mL. A negative result does not preclude SARS-Cov-2 infection and should  not be used as the sole basis for treatment or other patient management decisions. A negative result may occur with  improper specimen collection/handling, submission of specimen other than nasopharyngeal swab, presence of viral mutation(s) within the areas targeted by this assay, and inadequate number of viral copies(<138 copies/mL). A negative result must be combined with clinical observations, patient history, and epidemiological information. The expected result is Negative.  Fact Sheet for Patients:  EntrepreneurPulse.com.au  Fact Sheet for Healthcare Providers:  IncredibleEmployment.be  This test is no t yet approved or cleared by the [place] FDA and  has been authorized for detection and/or diagnosis of SARS-CoV-2 by FDA under an Emergency Use Authorization (EUA). This EUA will remain  in effect (meaning this test can be used) for the duration of the COVID-19 declaration under Section 564(b)(1) of the Act, 21 U.S.C.section 360bbb-3(b)(1), unless the authorization is terminated  or revoked sooner.       Influenza A by PCR NEGATIVE NEGATIVE Final   Influenza B by PCR NEGATIVE NEGATIVE Final    Comment: (NOTE) The Xpert Xpress SARS-CoV-2/FLU/RSV plus assay is intended as an aid in the diagnosis of influenza from Nasopharyngeal swab specimens and should not be used as a sole basis for treatment. Nasal washings and aspirates are unacceptable for Xpert Xpress SARS-CoV-2/FLU/RSV testing.  Fact Sheet for Patients: EntrepreneurPulse.com.au  Fact Sheet for Healthcare Providers: IncredibleEmployment.be  This test is not yet approved or cleared by the [place] FDA and has been authorized for detection and/or diagnosis of SARS-CoV-2 by FDA under an Emergency Use Authorization (EUA). This EUA will remain in effect (meaning this test can be used) for the duration of the COVID-19 declaration under  Section 564(b)(1) of the Act, 21 U.S.C. section 360bbb-3(b)(1), unless the authorization is terminated or revoked.  Performed at [facility], [address], [place], [place] [address]      Radiology Studies: ECHOCARDIOGRAM COMPLETE  Result Date: 06/04/2021    ECHOCARDIOGRAM REPORT   Patient Name:   [name] Date of Exam: 06/04/2021 Medical Rec #:  [id number]     Height:       66.0 in Accession #:    [id number]    Weight:       208.8 lb Date of Birth:  1945-06-12      BSA:          2.037 m Patient Age:    68 years      BP:           151/77 mmHg Patient Gender: F             HR:           86 bpm. Exam Location:  [facility] Procedure: 2D Echo and Intracardiac Opacification Agent Indications:     Dyspnea R06.00  History:         Patient has prior history of Echocardiogram examinations, most  recent 05/17/2021.                  Aortic Valve: bioprosthetic valve is present in the aortic                  position.  Sonographer:     Kathlen Brunswick RDCS Referring Phys:  [id number] AMY N COX Diagnosing Phys: Kate Sable MD  Sonographer Comments: Technically challenging study due to limited acoustic windows, Technically difficult study due to poor echo windows and no apical window. Image acquisition challenging due to patient body habitus. IMPRESSIONS  1. Left ventricular ejection fraction, by estimation, is 55 to 60%. The left ventricle has normal function. Left ventricular endocardial border not optimally defined to evaluate regional wall motion. There is moderate left ventricular hypertrophy. Left ventricular diastolic parameters are consistent with Grade II diastolic dysfunction (pseudonormalization).  2. Right ventricular systolic function is normal. The right ventricular size is not well visualized.  3. Left atrial size was mildly dilated.  4. A small pericardial effusion is present. The pericardial effusion is posterior and lateral to the left ventricle. There is no evidence of  cardiac tamponade.  5. The mitral valve is degenerative. Trivial mitral valve regurgitation. Mild to moderate mitral stenosis. The mean mitral valve gradient is 8.0 mmHg.  6. The aortic valve has been repaired/replaced. Aortic valve regurgitation is not visualized. There is a bioprosthetic valve present in the aortic position.  7. Aortic dilatation noted. There is mild dilatation of the ascending aorta, measuring 40 mm. FINDINGS  Left Ventricle: Left ventricular ejection fraction, by estimation, is 55 to 60%. The left ventricle has normal function. Left ventricular endocardial border not optimally defined to evaluate regional wall motion. Definity contrast agent was given IV to delineate the left ventricular endocardial borders. The left ventricular internal cavity size was normal in size. There is moderate left ventricular hypertrophy. Left ventricular diastolic parameters are consistent with Grade II diastolic dysfunction (pseudonormalization). Right Ventricle: The right ventricular size is not well visualized. No increase in right ventricular wall thickness. Right ventricular systolic function is normal. Left Atrium: Left atrial size was mildly dilated. Right Atrium: Right atrial size was not well visualized. Pericardium: A small pericardial effusion is present. The pericardial effusion is posterior and lateral to the left ventricle. There is no evidence of cardiac tamponade. Mitral Valve: The mitral valve is degenerative in appearance. Mild to moderate mitral annular calcification. Trivial mitral valve regurgitation. Mild to moderate mitral valve stenosis. MV peak gradient, 14.3 mmHg. The mean mitral valve gradient is 8.0 mmHg. Tricuspid Valve: The tricuspid valve is normal in structure. Tricuspid valve regurgitation is mild. Aortic Valve: The aortic valve has been repaired/replaced. Aortic valve regurgitation is not visualized. Aortic valve mean gradient measures 2.0 mmHg. Aortic valve peak gradient measures  4.1 mmHg. Aortic valve area, by VTI measures 4.24 cm. There is a bioprosthetic valve present in the aortic position. Pulmonic Valve: The pulmonic valve was normal in structure. Pulmonic valve regurgitation is mild. Aorta: Aortic dilatation noted. There is mild dilatation of the ascending aorta, measuring 40 mm. Venous: The inferior vena cava was not well visualized. IAS/Shunts: No atrial level shunt detected by color flow Doppler.  LEFT VENTRICLE PLAX 2D LVIDd:         4.55 cm      Diastology LVIDs:         3.15 cm      LV e' medial:    5.11 cm/s LV PW:  1.65 cm      LV E/e' medial:  31.3 LV IVS:        1.75 cm      LV e' lateral:   7.51 cm/s LVOT diam:     1.70 cm      LV E/e' lateral: 21.3 LV SV:         54 LV SV Index:   27 LVOT Area:     2.27 cm  LV Volumes (MOD) LV vol d, MOD A2C: 84.3 ml LV vol d, MOD A4C: 123.0 ml LV vol s, MOD A2C: 24.4 ml LV vol s, MOD A4C: 31.7 ml LV SV MOD A2C:     59.9 ml LV SV MOD A4C:     123.0 ml LV SV MOD BP:      79.1 ml LEFT ATRIUM           Index LA diam:      4.70 cm 2.31 cm/m LA Vol (A4C): 74.9 ml 36.77 ml/m  AORTIC VALVE                    PULMONIC VALVE AV Area (Vmax):    2.87 cm     PV Vmax:          1.32 m/s AV Area (Vmean):   2.86 cm     PV Peak grad:     7.0 mmHg AV Area (VTI):     4.24 cm     PR End Diast Vel: 6.66 msec AV Vmax:           101.35 cm/s AV Vmean:          64.200 cm/s AV VTI:            0.128 m AV Peak Grad:      4.1 mmHg AV Mean Grad:      2.0 mmHg LVOT Vmax:         128.00 cm/s LVOT Vmean:        80.900 cm/s LVOT VTI:          0.239 m LVOT/AV VTI ratio: 1.87  AORTA Ao Root diam: 2.90 cm Ao Asc diam:  4.00 cm MITRAL VALVE                TRICUSPID VALVE MV Area (PHT): 3.00 cm     TV Peak grad:   27.6 mmHg MV Area VTI:   1.02 cm     TV Vmax:        2.62 m/s MV Peak grad:  14.3 mmHg MV Mean grad:  8.0 mmHg     SHUNTS MV Vmax:       1.89 m/s     Systemic VTI:  0.24 m MV Vmean:      136.0 cm/s   Systemic Diam: 1.70 cm MV Decel Time: 253 msec MV E  velocity: 160.00 cm/s MV A velocity: 173.00 cm/s MV E/A ratio:  0.92 Kate Sable MD Electronically signed by Kate Sable MD Signature Date/Time: 06/04/2021/3:30:30 PM    Final         Scheduled Meds:  sodium chloride   Intravenous Once   aspirin EC  81 mg Oral Daily   azelastine  1 spray Each Nare BID   clopidogrel  75 mg Oral Daily   DULoxetine  20 mg Oral Q breakfast   enoxaparin (LOVENOX) injection  0.5 mg/kg Subcutaneous Q24H   FLUoxetine  40 mg Oral BID   insulin aspart  0-15 Units Subcutaneous TID WC  insulin aspart  0-5 Units Subcutaneous QHS   insulin glargine-yfgn  100 Units Subcutaneous QHS   isosorbide mononitrate  30 mg Oral Daily   levothyroxine  125 mcg Oral Q0600   losartan  25 mg Oral Q breakfast   pantoprazole  40 mg Oral Q breakfast   traZODone  200 mg Oral QHS   Continuous Infusions:  azithromycin 500 mg (06/06/21 0131)   cefTRIAXone (ROCEPHIN)  IV 2 g (06/06/21 0039)    LOS: 2 days   Time spent: 103min  Carlo Lorson C Marvelle Span, DO [facility]  If 7PM-7AM, please contact night-coverage [email or web address]  06/06/2021, 7:30 AM

## 2021-06-06 NOTE — Evaluation (Signed)
Physical Therapy Evaluation Patient Details Name: [name] MRN: [id number] DOB: [birth year] Today's Date: 06/06/2021  History of Present Illness  76 y.o. 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 She presents to the ED for chief concerns of shortness of breath.   Clinical Impression  Pt supine in bed, RN in room to flush IV post transfusion. Pt agreeable to therapy. She lives at home with her husband who is her primary caregiver; he is mobile with a prosthesis and able to provide some physical assist. Pt does require assist at baseline; she ambulates very limited distances within the house.   Pt performed bed mobility with MOD A. STS and 88ft of ambulation with CGA and HHA. Pt would benefit from RW. PT assisted pt with transfers from EOB>BSC>recliner. Pt was overall limited by her anxiety due to SOB and reported difficulty breathing; she wheezed intermittently throughout session. Pt guided through PLB which seemed to improved quality of breathing and decrease anxiety levels. Pt on 4L O2 at rest; titrated to 6L O2 during mobility to maintain SpO2 >89%. Pt returned to 4L at end of session; CNA in room for vitals. Pt husband able to provide current level of assist. Rec home with HHPT to address functional barriers listed above.      Recommendations for follow up therapy are one component of a multi-disciplinary discharge planning process, led by the attending physician.   Recommendations may be updated based on patient status, additional functional criteria and insurance authorization.  Follow Up Recommendations Home health PT    Assistance Recommended at Discharge Frequent or constant Supervision/Assistance  Patient can return home with the following  A little help with walking and/or transfers;A little help with bathing/dressing/bathroom    Equipment Recommendations None recommended by PT  Recommendations for Other Services       Functional Status Assessment Patient has had a recent decline in their functional status and demonstrates the ability to make significant improvements in function in a reasonable and predictable amount of time.     Precautions / Restrictions Precautions Precautions: Fall Restrictions Weight Bearing Restrictions: No      Mobility  Bed Mobility Overal bed mobility: Needs Assistance Bed Mobility: Supine to Sit     Supine to sit: Mod assist;HOB elevated     General bed mobility comments: to lift trunk to upright    Transfers Overall transfer level: Needs assistance Equipment used: 1 person hand held assist Transfers: Sit to/from Stand;Bed to chair/wheelchair/BSC Sit to Stand: Min guard Stand pivot transfers: Min guard         General transfer comment: CGA to steady with both STS and stand pivot transfer    Ambulation/Gait Ambulation/Gait assistance: Min guard Gait Distance (Feet): 3 Feet Assistive device: 1 person hand held assist   Gait velocity: decreased     General Gait Details: CGA to steady with ambulatory transfer from [facility] to recliner; pt reaching for window sill and arm rest to steady herself  Science writer  Modified Rankin (Stroke Patients Only)       Balance Overall balance assessment: Needs assistance Sitting-balance support: Bilateral upper extremity supported;Feet supported Sitting balance-Leahy Scale: Fair Sitting balance - Comments: no LOB  sitting EOB, close SUP for safety and pt comfort   Standing balance support: Single extremity supported;During functional activity Standing balance-Leahy Scale: Poor Standing balance comment: require CGA and reaches out for objects to steady herself during standing and transfer                             Pertinent Vitals/Pain Pain Assessment: No/denies pain    Home Living Family/patient expects to be discharged to:: Private residence Living Arrangements: Spouse/significant other Available Help at Discharge: Family;Other (Comment);Available 24 hours/day (husband is mobile with his prosthetic; is primary caregiver to pt) Type of Home: House Home Access: Stairs to enter Entrance Stairs-Rails: Left Entrance Stairs-Number of Steps: 5   Home Layout: One level Home Equipment: Shower seat;Hand held shower head;Grab bars - tub/shower;Rolling Walker (2 wheels);BSC/3in1 Additional Comments: Uses O2 at baseline (3L)    Prior Function Prior Level of Function : Needs assist       Physical Assist : Mobility (physical);ADLs (physical) Mobility (physical): Stairs;Gait ADLs (physical): Bathing;IADLs Mobility Comments: very limited ambulation distances; does not use AD, states she moves very slowly ADLs Comments: husband present for showers; assist with all IADLs     Hand Dominance   Dominant Hand: Right    Extremity/Trunk Assessment   Upper Extremity Assessment Upper Extremity Assessment: Generalized weakness    Lower Extremity Assessment Lower Extremity Assessment: Generalized weakness (RLE weaker than L - pt states this is baseline)       Communication   Communication: HOH  Cognition Arousal/Alertness: Awake/alert Behavior During Therapy: WFL for tasks assessed/performed;Anxious Overall Cognitive Status: Within Functional Limits for tasks assessed                                 General Comments: A&Ox3; not oriented to date; anxious throughout,  especially with SOB/mobility        General Comments      Exercises     Assessment/Plan    PT Assessment Patient needs continued PT services  PT Problem List Decreased strength;Decreased mobility;Decreased safety awareness;Cardiopulmonary status limiting activity;Decreased activity tolerance;Decreased balance       PT Treatment Interventions DME instruction;Therapeutic activities;Gait training;Therapeutic exercise;Patient/family education;Stair training;Balance training;Functional mobility training;Neuromuscular re-education    PT Goals (Current goals can be found in the Care Plan section)  Acute Rehab PT Goals Patient Stated Goal: to go home PT Goal Formulation: With patient Time For Goal Achievement: 06/20/21 Potential to Achieve Goals: Fair    Frequency Min 2X/week     Co-evaluation               AM-PAC PT "6 Clicks" Mobility  Outcome Measure Help needed turning from your back to your side while in a flat bed without using bedrails?: A Little Help needed moving from lying on your back to sitting on the side of a flat bed without using bedrails?: A Little Help needed moving to and from a bed to a chair (including a wheelchair)?: A Little Help needed standing up from a chair using your arms (e.g., wheelchair or bedside chair)?: A Little Help needed to walk in hospital room?: A Little Help needed climbing 3-5 steps with a railing? : A Lot 6  Click Score: 17    End of Session Equipment Utilized During Treatment: Gait belt Activity Tolerance: Other (comment) (limited by anxiety due to SOB/difficulty breathing) Patient left: in chair;with call bell/phone within reach;with nursing/sitter in room Nurse Communication: Mobility status PT Visit Diagnosis: Unsteadiness on feet (R26.81);Muscle weakness (generalized) (M62.81);Other abnormalities of gait and mobility (R26.89);Difficulty in walking, not elsewhere classified (R26.2)    Time: 4037-0964 PT Time Calculation  (min) (ACUTE ONLY): 29 min   Charges:   PT Evaluation $PT Eval High Complexity: 1 High PT Treatments $Therapeutic Activity: 8-22 mins        Patrina Levering PT, DPT 06/06/21 4:59 PM [phone]

## 2021-06-06 NOTE — Progress Notes (Signed)
PT Cancellation Note  Patient Details Name: [name] MRN: [id number] DOB: 24-Apr-1946   Cancelled Treatment:    Reason Eval/Treat Not Completed: Patient not medically ready.  PT consult received.  Chart reviewed.  Pt's Hgb noted to be down-trending to 6.9 this morning with order in chart for RBC transfusion.  Per PT guidelines for low Hgb, will hold PT at this time and re-attempt PT evaluation at a later date/time as medically appropriate.    Leitha Bleak, PT 06/06/21, 8:15 AM

## 2021-06-07 ENCOUNTER — Inpatient Hospital Stay: Payer: Medicare HMO

## 2021-06-07 DIAGNOSIS — F411 Generalized anxiety disorder: Secondary | ICD-10-CM

## 2021-06-07 DIAGNOSIS — Z952 Presence of prosthetic heart valve: Secondary | ICD-10-CM

## 2021-06-07 DIAGNOSIS — I359 Nonrheumatic aortic valve disorder, unspecified: Secondary | ICD-10-CM

## 2021-06-07 DIAGNOSIS — E039 Hypothyroidism, unspecified: Secondary | ICD-10-CM | POA: Diagnosis not present

## 2021-06-07 DIAGNOSIS — R0603 Acute respiratory distress: Secondary | ICD-10-CM | POA: Diagnosis not present

## 2021-06-07 DIAGNOSIS — J449 Chronic obstructive pulmonary disease, unspecified: Secondary | ICD-10-CM

## 2021-06-07 DIAGNOSIS — F3341 Major depressive disorder, recurrent, in partial remission: Secondary | ICD-10-CM

## 2021-06-07 DIAGNOSIS — Z7189 Other specified counseling: Secondary | ICD-10-CM

## 2021-06-07 DIAGNOSIS — Z66 Do not resuscitate: Secondary | ICD-10-CM

## 2021-06-07 DIAGNOSIS — G4733 Obstructive sleep apnea (adult) (pediatric): Secondary | ICD-10-CM

## 2021-06-07 DIAGNOSIS — Z515 Encounter for palliative care: Secondary | ICD-10-CM

## 2021-06-07 LAB — RESPIRATORY PANEL BY PCR

## 2021-06-07 LAB — BLOOD GAS, ARTERIAL
Acid-base deficit: 0.1 mmol/L (ref 0.0–2.0)
Bicarbonate: 25.9 mmol/L (ref 20.0–28.0)
FIO2: 1
O2 Saturation: 95 %
Patient temperature: 37
pCO2 arterial: 48 mmHg (ref 32.0–48.0)
pH, Arterial: 7.34 — ABNORMAL LOW (ref 7.350–7.450)
pO2, Arterial: 80 mmHg — ABNORMAL LOW (ref 83.0–108.0)

## 2021-06-07 LAB — TYPE AND SCREEN
ABO/RH(D): B POS
Antibody Screen: NEGATIVE
Unit division: 0

## 2021-06-07 LAB — GLUCOSE, CAPILLARY
Glucose-Capillary: 209 mg/dL — ABNORMAL HIGH (ref 70–99)
Glucose-Capillary: 168 mg/dL — ABNORMAL HIGH (ref 70–99)
Glucose-Capillary: 156 mg/dL — ABNORMAL HIGH (ref 70–99)
Glucose-Capillary: 130 mg/dL — ABNORMAL HIGH (ref 70–99)

## 2021-06-07 LAB — CBC
HCT: 24.4 % — ABNORMAL LOW (ref 36.0–46.0)
Hemoglobin: 7.8 g/dL — ABNORMAL LOW (ref 12.0–15.0)
MCH: 25.7 pg — ABNORMAL LOW (ref 26.0–34.0)
MCHC: 32 g/dL (ref 30.0–36.0)
MCV: 80.5 fL (ref 80.0–100.0)
Platelets: 174 10*3/uL (ref 150–400)
RBC: 3.03 MIL/uL — ABNORMAL LOW (ref 3.87–5.11)
RDW: 15.6 % — ABNORMAL HIGH (ref 11.5–15.5)
WBC: 18 10*3/uL — ABNORMAL HIGH (ref 4.0–10.5)
nRBC: 0 % (ref 0.0–0.2)

## 2021-06-07 LAB — BASIC METABOLIC PANEL
Anion gap: 7 (ref 5–15)
BUN: 32 mg/dL — ABNORMAL HIGH (ref 8–23)
CO2: 27 mmol/L (ref 22–32)
Calcium: 7.9 mg/dL — ABNORMAL LOW (ref 8.9–10.3)
Chloride: 98 mmol/L (ref 98–111)
Creatinine, Ser: 1.49 mg/dL — ABNORMAL HIGH (ref 0.44–1.00)
GFR, Estimated: 36 mL/min — ABNORMAL LOW (ref >60)
Glucose, Bld: 250 mg/dL — ABNORMAL HIGH (ref 70–99)
Potassium: 4.6 mmol/L (ref 3.5–5.1)
Sodium: 132 mmol/L — ABNORMAL LOW (ref 135–145)

## 2021-06-07 LAB — BPAM RBC
Blood Product Expiration Date: 202301132359
ISSUE DATE / TIME: 202301031232
Unit Type and Rh: 7300

## 2021-06-07 LAB — MRSA NEXT GEN BY PCR, NASAL: MRSA by PCR Next Gen: NOT DETECTED

## 2021-06-07 LAB — PROCALCITONIN: Procalcitonin: 1.09 ng/mL

## 2021-06-07 LAB — BRAIN NATRIURETIC PEPTIDE: B Natriuretic Peptide: 695.7 pg/mL — ABNORMAL HIGH (ref 0.0–100.0)

## 2021-06-07 LAB — LACTIC ACID, PLASMA: Lactic Acid, Venous: 1.6 mmol/L (ref 0.5–1.9)

## 2021-06-07 MED ORDER — FENTANYL CITRATE PF 50 MCG/ML IJ SOSY
25.0000 ug | PREFILLED_SYRINGE | Freq: Once | INTRAMUSCULAR | Status: AC
  Administered 2021-06-07: 25 ug via INTRAVENOUS

## 2021-06-07 MED ORDER — FENTANYL CITRATE PF 50 MCG/ML IJ SOSY: 25.0000 ug | PREFILLED_SYRINGE | Freq: Once | INTRAMUSCULAR | Status: AC

## 2021-06-07 MED ORDER — FUROSEMIDE 10 MG/ML IJ SOLN
20.0000 mg | Freq: Two times a day (BID) | INTRAMUSCULAR | Status: DC
  Administered 2021-06-07: 20 mg via INTRAVENOUS
  Filled 2021-06-07: qty 2

## 2021-06-07 MED ORDER — SODIUM CHLORIDE 0.9 % IV SOLN
250.0000 mL | INTRAVENOUS | Status: DC
  Administered 2021-06-09: 250 mL via INTRAVENOUS

## 2021-06-07 MED ORDER — FUROSEMIDE 10 MG/ML IJ SOLN
20.0000 mg | INTRAMUSCULAR | Status: AC
  Administered 2021-06-07: 20 mg via INTRAVENOUS
  Filled 2021-06-07: qty 2

## 2021-06-07 MED ORDER — NOREPINEPHRINE 4 MG/250ML-% IV SOLN: 0.0000 ug/min | INTRAVENOUS | Status: DC

## 2021-06-07 MED ORDER — LORAZEPAM 2 MG/ML IJ SOLN
0.5000 mg | INTRAMUSCULAR | Status: AC
  Administered 2021-06-07: 0.5 mg via INTRAVENOUS

## 2021-06-07 MED ORDER — DEXMEDETOMIDINE HCL IN NACL 400 MCG/100ML IV SOLN
0.4000 ug/kg/h | INTRAVENOUS | Status: DC
  Administered 2021-06-07: 0.8 ug/kg/h via INTRAVENOUS
  Administered 2021-06-07 (×2): 1.2 ug/kg/h via INTRAVENOUS
  Administered 2021-06-07: 10:00:00 0.8 ug/kg/h via INTRAVENOUS
  Administered 2021-06-08 (×6): 1.2 ug/kg/h via INTRAVENOUS
  Administered 2021-06-09 (×2): 0.8 ug/kg/h via INTRAVENOUS
  Administered 2021-06-09: 1.2 ug/kg/h via INTRAVENOUS
  Administered 2021-06-10: 1.4 ug/kg/h via INTRAVENOUS
  Administered 2021-06-10: 1.2 ug/kg/h via INTRAVENOUS
  Administered 2021-06-10: 1 ug/kg/h via INTRAVENOUS
  Administered 2021-06-10 (×2): 1.2 ug/kg/h via INTRAVENOUS
  Administered 2021-06-10 – 2021-06-12 (×14): 1.5 ug/kg/h via INTRAVENOUS
  Filled 2021-06-07 (×32): qty 100

## 2021-06-07 MED ORDER — FUROSEMIDE 10 MG/ML IJ SOLN
INTRAMUSCULAR | Status: AC
  Filled 2021-06-07: qty 8

## 2021-06-07 MED ORDER — PANTOPRAZOLE SODIUM 40 MG IV SOLR
40.0000 mg | Freq: Every day | INTRAVENOUS | Status: DC
  Administered 2021-06-07 – 2021-06-12 (×6): 40 mg via INTRAVENOUS
  Filled 2021-06-07 (×6): qty 40

## 2021-06-07 MED ORDER — FENTANYL CITRATE PF 50 MCG/ML IJ SOSY
12.5000 ug | PREFILLED_SYRINGE | Freq: Once | INTRAMUSCULAR | Status: AC
  Administered 2021-06-07: 12.5 ug via INTRAVENOUS

## 2021-06-07 MED ORDER — SODIUM CHLORIDE 0.9 % IV SOLN
100.0000 mg | Freq: Two times a day (BID) | INTRAVENOUS | Status: DC
  Administered 2021-06-07: 100 mg via INTRAVENOUS
  Filled 2021-06-07 (×3): qty 100

## 2021-06-07 MED ORDER — SODIUM CHLORIDE 0.9 % IV SOLN
1.0000 g | Freq: Two times a day (BID) | INTRAVENOUS | Status: DC
  Filled 2021-06-07 (×2): qty 1

## 2021-06-07 MED ORDER — FUROSEMIDE 10 MG/ML IJ SOLN
INTRAMUSCULAR | Status: AC
  Filled 2021-06-07: qty 4

## 2021-06-07 MED ORDER — CHLORHEXIDINE GLUCONATE CLOTH 2 % EX PADS
6.0000 | MEDICATED_PAD | Freq: Every day | CUTANEOUS | Status: DC
  Administered 2021-06-07 – 2021-06-11 (×4): 6 via TOPICAL

## 2021-06-07 MED ORDER — FENTANYL CITRATE PF 50 MCG/ML IJ SOSY
PREFILLED_SYRINGE | INTRAMUSCULAR | Status: AC
  Filled 2021-06-07: qty 1

## 2021-06-07 MED ORDER — LORAZEPAM 2 MG/ML IJ SOLN
0.5000 mg | Freq: Three times a day (TID) | INTRAMUSCULAR | Status: DC | PRN
  Administered 2021-06-07: 0.5 mg via INTRAVENOUS
  Administered 2021-06-07: 0.25 mg via INTRAVENOUS
  Filled 2021-06-07 (×3): qty 1

## 2021-06-07 MED ORDER — LORAZEPAM 2 MG/ML IJ SOLN
0.5000 mg | INTRAMUSCULAR | Status: AC
Start: 2021-06-07 — End: 2021-06-07
  Administered 2021-06-07: 0.5 mg via INTRAVENOUS
  Filled 2021-06-07: qty 1

## 2021-06-07 MED ORDER — FUROSEMIDE 10 MG/ML IJ SOLN
80.0000 mg | Freq: Once | INTRAMUSCULAR | Status: AC
  Administered 2021-06-07: 80 mg via INTRAVENOUS

## 2021-06-07 MED ORDER — ENOXAPARIN SODIUM 60 MG/0.6ML IJ SOSY
0.5000 mg/kg | PREFILLED_SYRINGE | INTRAMUSCULAR | Status: DC
  Administered 2021-06-07 – 2021-06-11 (×5): 50 mg via SUBCUTANEOUS
  Filled 2021-06-07 (×6): qty 0.5

## 2021-06-07 MED ORDER — LORAZEPAM 2 MG/ML IJ SOLN
0.2500 mg | Freq: Once | INTRAMUSCULAR | Status: AC
  Administered 2021-06-07: 0.25 mg via INTRAVENOUS
  Filled 2021-06-07: qty 1

## 2021-06-07 MED ORDER — NOREPINEPHRINE 4 MG/250ML-% IV SOLN
0.0000 ug/min | INTRAVENOUS | Status: DC
  Administered 2021-06-07: 2 ug/min via INTRAVENOUS
  Filled 2021-06-07: qty 250

## 2021-06-07 MED ORDER — PIPERACILLIN-TAZOBACTAM 3.375 G IVPB
3.3750 g | Freq: Three times a day (TID) | INTRAVENOUS | Status: DC
  Administered 2021-06-07: 3.375 g via INTRAVENOUS
  Filled 2021-06-07: qty 50

## 2021-06-07 MED ORDER — ORAL CARE MOUTH RINSE
15.0000 mL | Freq: Two times a day (BID) | OROMUCOSAL | Status: DC
  Administered 2021-06-07 – 2021-06-11 (×7): 15 mL via OROMUCOSAL

## 2021-06-07 MED ORDER — ENOXAPARIN SODIUM 40 MG/0.4ML IJ SOSY: 40.0000 mg | PREFILLED_SYRINGE | INTRAMUSCULAR | Status: DC

## 2021-06-07 MED ORDER — IPRATROPIUM-ALBUTEROL 0.5-2.5 (3) MG/3ML IN SOLN
RESPIRATORY_TRACT | Status: AC
  Filled 2021-06-07: qty 3

## 2021-06-07 MED ORDER — FENTANYL CITRATE PF 50 MCG/ML IJ SOSY
PREFILLED_SYRINGE | INTRAMUSCULAR | Status: AC
  Administered 2021-06-07: 25 ug via INTRAVENOUS
  Filled 2021-06-07: qty 1

## 2021-06-07 MED ORDER — METHYLPREDNISOLONE SODIUM SUCC 40 MG IJ SOLR
20.0000 mg | Freq: Every day | INTRAMUSCULAR | Status: DC
  Administered 2021-06-08 – 2021-06-12 (×5): 20 mg via INTRAVENOUS
  Filled 2021-06-07 (×5): qty 1

## 2021-06-07 MED ORDER — FUROSEMIDE 10 MG/ML IJ SOLN
20.0000 mg | Freq: Once | INTRAMUSCULAR | Status: AC
  Administered 2021-06-07: 20 mg via INTRAVENOUS

## 2021-06-07 MED ORDER — FENTANYL CITRATE PF 50 MCG/ML IJ SOSY
12.5000 ug | PREFILLED_SYRINGE | INTRAMUSCULAR | Status: DC | PRN
  Administered 2021-06-07 – 2021-06-08 (×3): 12.5 ug via INTRAVENOUS
  Administered 2021-06-08: 25 ug via INTRAVENOUS
  Administered 2021-06-08: 12.5 ug via INTRAVENOUS
  Filled 2021-06-07 (×5): qty 1

## 2021-06-07 MED ORDER — LINEZOLID 600 MG/300ML IV SOLN
600.0000 mg | Freq: Two times a day (BID) | INTRAVENOUS | Status: DC
  Administered 2021-06-07: 600 mg via INTRAVENOUS
  Filled 2021-06-07 (×2): qty 300

## 2021-06-07 MED ORDER — NOREPINEPHRINE 4 MG/250ML-% IV SOLN
2.0000 ug/min | INTRAVENOUS | Status: DC
  Administered 2021-06-07: 3 ug/min via INTRAVENOUS

## 2021-06-07 NOTE — Consult Note (Signed)
CRITICAL CARE         Date: 06/07/2021,   MRN# [id number] [name] 06/12/1945     AdmissionWeight: 98.1 kg                 CurrentWeight: 97.6 kg   Referring physician: Dr Jimmye Norman   CHIEF COMPLAINT:   Severe acute on chronic hypoxemic respiratory failure   HISTORY OF PRESENT ILLNESS   76 yo with hx of CABG 2015 , depression,anxiety, hypertension, OSA, COPD, iron deficiency anemia, aortic dilatation, presents emergency department for cc of dyspnea and sob. She is chronically hypoxemic with 3L/min [place] requirement. Came in for acute onset of sob/doe was negative for PE on admission. She is on BIPAP in resp distress.She denies recent fever, nausea, vomiting, abdominal pain, dysuria, diarrhea, hematuria, blood in her stool, syncope, lost of consciousness. She reports chest pain and severe SOB.She has severe anxiety and recurrent panic attacks She reports no new cough. She is unsure if she has had weight gain or lost. She denies flu like illness. She denies LE edema. PCCM consult placed for further evaluation and management.    PAST MEDICAL HISTORY   Past Medical History:  Diagnosis Date   1st degree AV block    ACE-inhibitor cough    Allergic rhinitis    Anemia    iron deficiency anemia   Anxiety    Aortic ectasia (HCC)    a. CT abd in 12/2016 incidentally noted aortic atherosclerosis and infrarenal abdominal aortic ectasia measuring as large as 2.7 cm with recommendation to repeat US in 2023.   Aortic stenosis    a. 10/2013 s/p AVR; b. 04/2021 s/p TAVR.   Arthritis    Asthma    Cataract    Chronic depression    Chronic diastolic CHF (congestive heart failure) (HCC)    Chronic headache    CKD (chronic kidney disease), stage III (HCC)    COPD (chronic obstructive pulmonary disease) (HCC)    Coronary artery disease    a. DES to RCA and mid Cx 2009. b. CABG x 2 and bioprosthetic AVR May 2015. c. cutting balloon to prox Cx in 05/2016; d. 11/2020 PCI/DES to LCX and  VG->RCA (prox and distal); e. 02/2021 Cath: LM nl, LAD 124m CTO, D1 min irregs, LCX 50p/m ISR, patent mid stent, RCA 20ost, 80p, 147m, RPDA nl, VG->dRCA patent p/d stents, LIMA->LAD patent.   Dementia ([place])    Diverticulitis of colon    Essential hypertension    GERD (gastroesophageal reflux disease)    Hearing loss    History of blood transfusion 2013   History of prosthetic aortic valve replacement    a. 10/2013 s/p bioprosthetic AVR; b. 04/2021 s/p TAVR w/ Oletta Lamas Sapien 3 THV (67mm); c. 05/2021 Echo: EF 55-60%, no PV leak, 27mmHg mean gradient; d. 06/2021 Echo: EF 55-60%, mod LVH. GrII DD. Nl RV size/fxn. Mildly dil LA. Small peric eff w/o tamponade. Triv MR. Mild-mod MS (mean grad 43mmHg), Nl fxn'ing AoV - peak grad 4.38mmHg, mean grad 2.24mmHg. AVA 4.24cm^2 (VTI).   HOH (hard of hearing)    Hypercholesterolemia    a. intolerance of statins and niaspan -   IDA (iron deficiency anemia) 02/03/2019   Mitral stenosis    a. 06/2021 Echo: Mild-mod MS (mean gradient 41mmHg.   Mobitz type 1 second degree AV block    OSA (obstructive sleep apnea)    mild, intolerant of cpap   PAD (peripheral artery disease) ([place])  a. atherosclerosis by CT abd 12/2016 in LE.   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 Sumner Boast & Dr. Cyndia Bent; b. 05/2021 Echo: EF 55-60%, no PV leak, 62mmHg mean gradient; c. 06/2021 Echo: EF 55-60%, mod LVH. GrII DD. Nl fxn'ing AoV - peak grad 4.52mmHg, mean grad 2.22mmHg. AVA 4.24cm^2 (VTI).   Statin intolerance    Thyroid disease    Type II diabetes mellitus ([place])      SURGICAL HISTORY   Past Surgical History:  Procedure Laterality Date   ABDOMINAL HYSTERECTOMY     ABDOMINAL HYSTERECTOMY W/ PARTIAL VAGINACTOMY     AORTIC VALVE REPLACEMENT N/A 10/12/2013   Procedure: AORTIC VALVE REPLACEMENT (AVR);  Surgeon: [name], MD;  Location: [facility];  Service: Open Heart Surgery;  Laterality: N/A;   APPENDECTOMY  1964   BARTHOLIN GLAND CYST EXCISION     BLADDER SUSPENSION     BREAST BIOPSY Bilateral 09/11/2000   neg   BREAST BIOPSY Left 07/24/2010   neg   BREAST CYST EXCISION  1988   bilateral nonmalignant tumors, x3   CARDIAC CATHETERIZATION     CARDIAC CATHETERIZATION N/A 05/25/2016   Procedure: Coronary Balloon Angioplasty;  Surgeon: Leonie Man, MD;  Location: [facility];  Service: Cardiovascular;  Laterality: N/A;   CARDIAC CATHETERIZATION N/A 05/25/2016   Procedure: Coronary/Graft Angiography;  Surgeon: Leonie Man, MD;  Location: [facility];  Service: Cardiovascular;  Laterality: N/A;   CATARACT EXTRACTION W/ INTRAOCULAR LENS  IMPLANT, BILATERAL     CHOLECYSTECTOMY  2001   COLECTOMY     lap sigmoid   COLONOSCOPY  2014   polyps found, 2 clamped off.   CORONARY ANGIOPLASTY  10/29/2007   Prox RCA & Mid Cx.   CORONARY ARTERY BYPASS GRAFT N/A 10/12/2013   Procedure: CORONARY ARTERY BYPASS GRAFT TIMES TWO;  Surgeon: [name], MD;  Location: [facility];  Service: Open Heart Surgery;  Laterality: N/A;   CORONARY/GRAFT ANGIOGRAPHY N/A 09/20/2017   Procedure: CORONARY/GRAFT ANGIOGRAPHY;  Surgeon: Sherren Mocha, MD;  Location: [facility];  Service: Cardiovascular;  Laterality: N/A;   INTRAOPERATIVE TRANSTHORACIC ECHOCARDIOGRAM N/A 04/18/2021   Procedure: INTRAOPERATIVE TRANSTHORACIC ECHOCARDIOGRAM;  Surgeon: [name], MD;  Location: [facility];  Service: Open Heart Surgery;  Laterality: N/A;   LEFT HEART CATHETERIZATION WITH CORONARY ANGIOGRAM N/A 10/09/2013   Procedure: LEFT HEART CATHETERIZATION WITH CORONARY ANGIOGRAM;  Surgeon: [name], MD;  Location: [facility];  Service: Cardiovascular;  Laterality: N/A;   RIGHT/LEFT HEART CATH AND CORONARY ANGIOGRAPHY N/A 11/03/2020   Procedure: RIGHT/LEFT HEART CATH AND CORONARY ANGIOGRAPHY;  Surgeon: Corey Skains, MD;   Location: [facility];  Service: Cardiovascular;  Laterality: N/A;   RIGHT/LEFT HEART CATH AND CORONARY/GRAFT ANGIOGRAPHY N/A 02/10/2021   Procedure: RIGHT/LEFT HEART CATH AND CORONARY/GRAFT ANGIOGRAPHY;  Surgeon: Andrez Grime, MD;  Location: [facility];  Service: Cardiovascular;  Laterality: N/A;   STERNAL WIRES REMOVAL N/A 04/13/2014   Procedure: STERNAL WIRES REMOVAL;  Surgeon: [name], MD;  Location: [facility];  Service: Thoracic;  Laterality: N/A;   TEE WITHOUT CARDIOVERSION N/A 11/03/2020   Procedure: TRANSESOPHAGEAL ECHOCARDIOGRAM (TEE);  Surgeon: Corey Skains, MD;  Location: [facility];  Service: Cardiovascular;  Laterality: N/A;   TEE WITHOUT CARDIOVERSION N/A 02/13/2021  Procedure: TRANSESOPHAGEAL ECHOCARDIOGRAM (TEE);  Surgeon: Corey Skains, MD;  Location: [facility];  Service: Cardiovascular;  Laterality: N/A;   THYROIDECTOMY     TONSILLECTOMY     TRANSCATHETER AORTIC VALVE REPLACEMENT, TRANSFEMORAL N/A 04/18/2021   Procedure: TRANSCATHETER AORTIC VALVE REPLACEMENT, TRANSFEMORAL;  Surgeon: [name], MD;  Location: [facility];  Service: Open Heart Surgery;  Laterality: N/A;   TUBAL LIGATION     VAGINAL DELIVERY     3   VISCERAL ARTERY INTERVENTION N/A 08/16/2016   Procedure: Visceral Artery Intervention;  Surgeon: Algernon Huxley, MD;  Location: [facility];  Service: Cardiovascular;  Laterality: N/A;     FAMILY HISTORY   Family History  Problem Relation Age of Onset   Breast cancer Mother 56   Hypertension Father    Mesothelioma Father    Asthma Father    Stroke Paternal Grandfather    Heart disease Other    Breast cancer Maternal Aunt    Breast cancer Paternal Aunt      SOCIAL HISTORY   Social History   Tobacco Use   Smoking status: Former    Packs/day: 0.50    Years: 30.00    Pack years: 15.00    Types: Cigarettes    Quit date: 10/02/2013    Years since quitting: 7.6   Smokeless tobacco: Never   Vaping Use   Vaping Use: Never used  Substance Use Topics   Alcohol use: No   Drug use: No     MEDICATIONS    Home Medication:    Current Medication:  Current Facility-Administered Medications:    albuterol (PROVENTIL) (2.5 MG/3ML) 0.083% nebulizer solution 2.5 mg, 2.5 mg, Nebulization, Q6H PRN, Cox, Amy N, DO, 2.5 mg at 06/07/21 0204   azelastine (ASTELIN) 0.1 % nasal spray 1 spray, 1 spray, Each Nare, BID, Cox, Amy N, DO, 1 spray at 06/06/21 2008   benzonatate (TESSALON) capsule 200 mg, 200 mg, Oral, TID PRN, Cox, Amy N, DO, 200 mg at 06/07/21 0608   Chlorhexidine Gluconate Cloth 2 % PADS 6 each, 6 each, Topical, Daily, Kayleen Memos, DO, 6 each at 06/07/21 0849   clopidogrel (PLAVIX) tablet 75 mg, 75 mg, Oral, Daily, Cox, Amy N, DO, 75 mg at 06/06/21 0813   dexmedetomidine (PRECEDEX) 400 MCG/100ML (4 mcg/mL) infusion, 0.4-1.2 mcg/kg/hr, Intravenous, Titrated, Omni Dunsworth, MD, Last Rate: 24.4 mL/hr at 06/07/21 1025, 1 mcg/kg/hr at 06/07/21 1025   DULoxetine (CYMBALTA) DR capsule 20 mg, 20 mg, Oral, Q breakfast, Cox, Amy N, DO, 20 mg at 06/06/21 0806   FLUoxetine (PROZAC) capsule 40 mg, 40 mg, Oral, BID, Cox, Amy N, DO, 40 mg at 06/06/21 2007   furosemide (LASIX) 10 MG/ML injection, , , ,    furosemide (LASIX) injection 20 mg, 20 mg, Intravenous, BID, Hall, Carole N, DO, 20 mg at 06/07/21 0956   insulin aspart (novoLOG) injection 0-15 Units, 0-15 Units, Subcutaneous, TID WC, Cox, Amy N, DO, 5 Units at 06/07/21 0849   insulin aspart (novoLOG) injection 0-5 Units, 0-5 Units, Subcutaneous, QHS, Cox, Amy N, DO, 5 Units at 06/06/21 2307   insulin glargine-yfgn (SEMGLEE) injection 100 Units, 100 Units, Subcutaneous, QHS, Cox, Amy N, DO, 100 Units at 06/06/21 2309   levothyroxine (SYNTHROID) tablet 125 mcg, 125 mcg, Oral, Q0600, Cox, Amy N, DO, 125 mcg at 06/06/21 0805   linezolid (ZYVOX) IVPB 600 mg, 600 mg, Intravenous, Q12H, Hall, Carole N, DO, Stopped at 06/07/21 1011   LORazepam  (ATIVAN)  injection 0.5 mg, 0.5 mg, Intravenous, Q8H PRN, Hall, Carole N, DO   meropenem (MERREM) 1 g in sodium chloride 0.9 % 100 mL IVPB, 1 g, Intravenous, Q12H, Benita Gutter, RPH   nitroGLYCERIN (NITROSTAT) SL tablet 0.4 mg, 0.4 mg, Sublingual, Q5 min PRN, Cox, Amy N, DO   ondansetron (ZOFRAN) tablet 4 mg, 4 mg, Oral, Q6H PRN **OR** ondansetron (ZOFRAN) injection 4 mg, 4 mg, Intravenous, Q6H PRN, Cox, Amy N, DO, 4 mg at 06/06/21 2007   pantoprazole (PROTONIX) EC tablet 40 mg, 40 mg, Oral, Q breakfast, Cox, Amy N, DO, 40 mg at 06/06/21 0806    ALLERGIES   Amitriptyline, Benadryl [diphenhydramine], Demerol [meperidine], Gabapentin, Mirtazapine, Olanzapine, Voltaren [diclofenac sodium], Zetia [ezetimibe], Ativan [lorazepam], Atorvastatin, Budesonide-formoterol fumarate, Bupropion hcl, Caffeine, Codeine sulfate, Lisinopril, Metformin, Mometasone furoate, Morphine sulfate, Other, Oxycodone-acetaminophen, Pioglitazone, Propoxyphene n-acetaminophen, Rosuvastatin, Shellfish allergy, Suvorexant, Ticagrelor, Tramadol, Venlafaxine, Xanax [alprazolam], Zolpidem tartrate, and Latex     REVIEW OF SYSTEMS    Review of Systems:  Gen:  Denies  fever, sweats, chills weigh loss  HEENT: Denies blurred vision, double vision, ear pain, eye pain, hearing loss, nose bleeds, sore throat Cardiac:  No dizziness, chest pain or heaviness, chest tightness,edema Resp:   Denies cough or sputum porduction, shortness of breath,wheezing, hemoptysis,  Gi: Denies swallowing difficulty, stomach pain, nausea or vomiting, diarrhea, constipation, bowel incontinence Gu:  Denies bladder incontinence, burning urine Ext:   Denies Joint pain, stiffness or swelling Skin: Denies  skin rash, easy bruising or bleeding or hives Endoc:  Denies polyuria, polydipsia , polyphagia or weight change Psych:   Denies depression, insomnia or hallucinations   Other:  All other systems negative   VS: BP 136/77    Pulse (!) 101    Temp (!)  97 F (36.1 C) (Axillary)    Resp (!) 30    Ht 5\' 6"  (1.676 m)    Wt 97.6 kg    SpO2 100%    BMI 34.73 kg/m      PHYSICAL EXAM    GENERAL:NAD, no fevers, chills, no weakness no fatigue HEAD: Normocephalic, atraumatic.  EYES: Pupils equal, round, reactive to light. Extraocular muscles intact. No scleral icterus.  MOUTH: Moist mucosal membrane. Dentition intact. No abscess noted.  EAR, NOSE, THROAT: Clear without exudates. No external lesions.  NECK: Supple. No thyromegaly. No nodules. No JVD.  PULMONARY: BIPAP lung sounds CARDIOVASCULAR: S1 and S2. Regular rate and rhythm. No murmurs, rubs, or gallops. No edema. Pedal pulses 2+ bilaterally.  GASTROINTESTINAL: Soft, nontender, nondistended. No masses. Positive bowel sounds. No hepatosplenomegaly.  MUSCULOSKELETAL: No swelling, clubbing, or edema. Range of motion full in all extremities.  NEUROLOGIC: Cranial nerves II through XII are intact. No gross focal neurological deficits. Sensation intact. Reflexes intact.  SKIN: No ulceration, lesions, rashes, or cyanosis. Skin warm and dry. Turgor intact.  PSYCHIATRIC: severely distressed       IMAGING    CT Angio Chest Pulmonary Embolism (PE) W or WO Contrast  Result Date: 06/04/2021 CLINICAL DATA:  Pulmonary embolism (PE) suspected, positive D-dimer EXAM: CT ANGIOGRAPHY CHEST WITH CONTRAST TECHNIQUE: Multidetector CT imaging of the chest was performed using the standard protocol during bolus administration of intravenous contrast. Multiplanar CT image reconstructions and MIPs were obtained to evaluate the vascular anatomy. CONTRAST:  53mL OMNIPAQUE IOHEXOL 350 MG/ML SOLN COMPARISON:  02/01/2021 FINDINGS: Cardiovascular: Adequate opacification of the pulmonary arterial tree. No intraluminal filling defect identified to suggest acute pulmonary embolism. Central pulmonary arteries are enlarged in keeping with  changes of pulmonary arterial hypertension, unchanged. Transcatheter aortic valve  replacement has been performed. Multi-vessel coronary artery stenting has been performed as well as coronary artery bypass grafting. Cardiomegaly is present, stable. No pericardial effusion. Extensive atheromatous plaque is seen within the thoracic aorta demonstrating mural irregularity and numerous shallow ulcer like projections. No aortic aneurysm. Mediastinum/Nodes: The thyroid gland is absent or atretic. The esophagus is unremarkable. There is interval enlargement of multiple pathologically enlarged lymph nodes within the right paratracheal and prevascular lymph node groups which measure up to 19 mm in short axis diameter. Numerous shotty bilateral hilar lymph nodes are also identified. Lungs/Pleura: Extensive bilateral asymmetric ground-glass pulmonary infiltrate has developed, most in keeping with atypical infection in the acute setting. No pneumothorax or pleural effusion. No central obstructing lesion. Upper Abdomen: No acute abnormality. Musculoskeletal: No acute bone abnormality. No lytic or blastic bone lesion. Review of the MIP images confirms the above findings. IMPRESSION: No pulmonary embolism. Extensive multifocal pulmonary infiltrate most in keeping with atypical infection in the acute setting. Stable cardiomegaly. Aortic Atherosclerosis (ICD10-I70.0). Electronically Signed   By: Fidela Salisbury M.D.   On: 06/04/2021 00:04   US Venous Img Lower Unilateral Left  Result Date: 05/04/2021 CLINICAL DATA:  Initial evaluation for acute swelling of left calf, evaluate for DVT. EXAM: Left LOWER EXTREMITY VENOUS DOPPLER ULTRASOUND TECHNIQUE: Gray-scale sonography with graded compression, as well as color Doppler and duplex ultrasound were performed to evaluate the lower extremity deep venous systems from the level of the common femoral vein and including the common femoral, femoral, profunda femoral, popliteal and calf veins including the posterior tibial, peroneal and gastrocnemius veins when visible.  The superficial great saphenous vein was also interrogated. Spectral Doppler was utilized to evaluate flow at rest and with distal augmentation maneuvers in the common femoral, femoral and popliteal veins. COMPARISON:  None. FINDINGS: Contralateral Common Femoral Vein: Respiratory phasicity is normal and symmetric with the symptomatic side. No evidence of thrombus. Normal compressibility. Common Femoral Vein: No evidence of thrombus. Normal compressibility, respiratory phasicity and response to augmentation. Saphenofemoral Junction: No evidence of thrombus. Normal compressibility and flow on color Doppler imaging. Profunda Femoral Vein: No evidence of thrombus. Normal compressibility and flow on color Doppler imaging. Femoral Vein: No evidence of thrombus. Normal compressibility, respiratory phasicity and response to augmentation. Popliteal Vein: No evidence of thrombus. Normal compressibility, respiratory phasicity and response to augmentation. Calf Veins: No evidence of thrombus. Normal compressibility and flow on color Doppler imaging. Superficial Great Saphenous Vein: No evidence of thrombus. Normal compressibility. Venous Reflux:  None. Other Findings:  None. IMPRESSION: No evidence of deep venous thrombosis. Electronically Signed   By: Jeannine Boga M.D.   On: 05/18/2021 19:41   DG Chest Port 1 View  Result Date: 06/07/2021 CLINICAL DATA:  Reason for exam: SOB Hx of CHF, COPD, asthma, aortic valve repair, history of bioprosthetic aortic valve replacement failure with severe AS and resulting in TAVR on 04/20/2021, Rapid Response Nurse currently in room. EXAM: PORTABLE CHEST - 1 VIEW COMPARISON:  05/31/2021 FINDINGS: Interval progression of extensive bilateral asymmetric airspace opacities most confluent in the right mid and upper lung, and left lower lung, with enlarging focal opacity centrally in the left upper lung and worsening interstitial and airspace opacities at the right lung base. Heart size  upper limits normal for technique. Previous AVR. CABG markers. Blunting of lateral costophrenic angles suggesting effusions. No pneumothorax. Sternotomy wires. IMPRESSION: 1. Worsening asymmetric extensive airspace infiltrates or edema. 2. Suspect small pleural effusions.  Electronically Signed   By: Lucrezia Europe M.D.   On: 06/07/2021 07:51   DG Chest Port 1 View  Result Date: 05/17/2021 CLINICAL DATA:  Pt to ED via [facility] from home for shortness of breath. Pt speaking in complete sentences stating that she cannot breath. EXAM: PORTABLE CHEST 1 VIEW COMPARISON:  05/21/2021 FINDINGS: Stable changes from prior cardiac surgery and aortic valve replacement. Cardiac silhouette is enlarged. No mediastinal or hilar masses. Bilateral interstitial and patchy hazy airspace lung opacities. Interstitial opacities are similar to the prior exam, airspace opacities appear new. No convincing pleural effusion.  No pneumothorax. Skeletal structures are grossly intact. IMPRESSION: 1. Interstitial and hazy airspace lung opacities. Congestive heart failure with pulmonary edema suspected. Multifocal pneumonia is also in the differential diagnosis. Electronically Signed   By: Lajean Manes M.D.   On: 05/16/2021 18:42   DG Chest Portable 1 View  Result Date: 05/21/2021 CLINICAL DATA:  Shortness of breath and heart racing. EXAM: PORTABLE CHEST 1 VIEW COMPARISON:  April 30, 2021 FINDINGS: Multiple sternal wires are noted. Stable, diffuse, chronic appearing increased interstitial lung markings are seen. There is no evidence of focal consolidation, pleural effusion or pneumothorax. The cardiac silhouette is enlarged and unchanged in size. An artificial aortic valve is seen. The visualized skeletal structures are unremarkable. IMPRESSION: Stable cardiomegaly and evidence of prior median sternotomy/CABG, without acute cardiopulmonary disease. Electronically Signed   By: Virgina Norfolk M.D.   On: 05/21/2021 02:40   DG Knee Complete  4 Views Left  Result Date: 05/27/2021 CLINICAL DATA:  Pain, swelling, bruising.  Fall. EXAM: LEFT KNEE - COMPLETE 4+ VIEW COMPARISON:  None. FINDINGS: Anterior soft tissue swelling. No acute bony abnormality. Specifically, no fracture, subluxation, or dislocation. No joint effusion. IMPRESSION: Anterior soft tissue swelling.  No acute bony abnormality. Electronically Signed   By: Rolm Baptise M.D.   On: 05/27/2021 05:28   ECHOCARDIOGRAM COMPLETE  Result Date: 06/04/2021    ECHOCARDIOGRAM REPORT   Patient Name:   DAZIYAH COGAN Date of Exam: 06/04/2021 Medical Rec #:  [id number]     Height:       66.0 in Accession #:    [id number]    Weight:       208.8 lb Date of Birth:  Jan 20, 1946      BSA:          2.037 m Patient Age:    41 years      BP:           151/77 mmHg Patient Gender: F             HR:           86 bpm. Exam Location:  [facility] Procedure: 2D Echo and Intracardiac Opacification Agent Indications:     Dyspnea R06.00  History:         Patient has prior history of Echocardiogram examinations, most                  recent 05/17/2021.                  Aortic Valve: bioprosthetic valve is present in the aortic                  position.  Sonographer:     Kathlen Brunswick RDCS Referring Phys:  [id number] AMY N COX Diagnosing Phys: Kate Sable MD  Sonographer Comments: Technically challenging study due to limited acoustic windows, Technically difficult study due to poor echo windows  and no apical window. Image acquisition challenging due to patient body habitus. IMPRESSIONS  1. Left ventricular ejection fraction, by estimation, is 55 to 60%. The left ventricle has normal function. Left ventricular endocardial border not optimally defined to evaluate regional wall motion. There is moderate left ventricular hypertrophy. Left ventricular diastolic parameters are consistent with Grade II diastolic dysfunction (pseudonormalization).  2. Right ventricular systolic function is normal. The right ventricular size is not  well visualized.  3. Left atrial size was mildly dilated.  4. A small pericardial effusion is present. The pericardial effusion is posterior and lateral to the left ventricle. There is no evidence of cardiac tamponade.  5. The mitral valve is degenerative. Trivial mitral valve regurgitation. Mild to moderate mitral stenosis. The mean mitral valve gradient is 8.0 mmHg.  6. The aortic valve has been repaired/replaced. Aortic valve regurgitation is not visualized. There is a bioprosthetic valve present in the aortic position.  7. Aortic dilatation noted. There is mild dilatation of the ascending aorta, measuring 40 mm. FINDINGS  Left Ventricle: Left ventricular ejection fraction, by estimation, is 55 to 60%. The left ventricle has normal function. Left ventricular endocardial border not optimally defined to evaluate regional wall motion. Definity contrast agent was given IV to delineate the left ventricular endocardial borders. The left ventricular internal cavity size was normal in size. There is moderate left ventricular hypertrophy. Left ventricular diastolic parameters are consistent with Grade II diastolic dysfunction (pseudonormalization). Right Ventricle: The right ventricular size is not well visualized. No increase in right ventricular wall thickness. Right ventricular systolic function is normal. Left Atrium: Left atrial size was mildly dilated. Right Atrium: Right atrial size was not well visualized. Pericardium: A small pericardial effusion is present. The pericardial effusion is posterior and lateral to the left ventricle. There is no evidence of cardiac tamponade. Mitral Valve: The mitral valve is degenerative in appearance. Mild to moderate mitral annular calcification. Trivial mitral valve regurgitation. Mild to moderate mitral valve stenosis. MV peak gradient, 14.3 mmHg. The mean mitral valve gradient is 8.0 mmHg. Tricuspid Valve: The tricuspid valve is normal in structure. Tricuspid valve  regurgitation is mild. Aortic Valve: The aortic valve has been repaired/replaced. Aortic valve regurgitation is not visualized. Aortic valve mean gradient measures 2.0 mmHg. Aortic valve peak gradient measures 4.1 mmHg. Aortic valve area, by VTI measures 4.24 cm. There is a bioprosthetic valve present in the aortic position. Pulmonic Valve: The pulmonic valve was normal in structure. Pulmonic valve regurgitation is mild. Aorta: Aortic dilatation noted. There is mild dilatation of the ascending aorta, measuring 40 mm. Venous: The inferior vena cava was not well visualized. IAS/Shunts: No atrial level shunt detected by color flow Doppler.  LEFT VENTRICLE PLAX 2D LVIDd:         4.55 cm      Diastology LVIDs:         3.15 cm      LV e' medial:    5.11 cm/s LV PW:         1.65 cm      LV E/e' medial:  31.3 LV IVS:        1.75 cm      LV e' lateral:   7.51 cm/s LVOT diam:     1.70 cm      LV E/e' lateral: 21.3 LV SV:         54 LV SV Index:   27 LVOT Area:     2.27 cm  LV Volumes (MOD) LV vol  d, MOD A2C: 84.3 ml LV vol d, MOD A4C: 123.0 ml LV vol s, MOD A2C: 24.4 ml LV vol s, MOD A4C: 31.7 ml LV SV MOD A2C:     59.9 ml LV SV MOD A4C:     123.0 ml LV SV MOD BP:      79.1 ml LEFT ATRIUM           Index LA diam:      4.70 cm 2.31 cm/m LA Vol (A4C): 74.9 ml 36.77 ml/m  AORTIC VALVE                    PULMONIC VALVE AV Area (Vmax):    2.87 cm     PV Vmax:          1.32 m/s AV Area (Vmean):   2.86 cm     PV Peak grad:     7.0 mmHg AV Area (VTI):     4.24 cm     PR End Diast Vel: 6.66 msec AV Vmax:           101.35 cm/s AV Vmean:          64.200 cm/s AV VTI:            0.128 m AV Peak Grad:      4.1 mmHg AV Mean Grad:      2.0 mmHg LVOT Vmax:         128.00 cm/s LVOT Vmean:        80.900 cm/s LVOT VTI:          0.239 m LVOT/AV VTI ratio: 1.87  AORTA Ao Root diam: 2.90 cm Ao Asc diam:  4.00 cm MITRAL VALVE                TRICUSPID VALVE MV Area (PHT): 3.00 cm     TV Peak grad:   27.6 mmHg MV Area VTI:   1.02 cm     TV  Vmax:        2.62 m/s MV Peak grad:  14.3 mmHg MV Mean grad:  8.0 mmHg     SHUNTS MV Vmax:       1.89 m/s     Systemic VTI:  0.24 m MV Vmean:      136.0 cm/s   Systemic Diam: 1.70 cm MV Decel Time: 253 msec MV E velocity: 160.00 cm/s MV A velocity: 173.00 cm/s MV E/A ratio:  0.92 Kate Sable MD Electronically signed by Kate Sable MD Signature Date/Time: 06/04/2021/3:30:30 PM    Final    ECHOCARDIOGRAM COMPLETE  Result Date: 05/17/2021    ECHOCARDIOGRAM REPORT   Patient Name:   QUINNIE BARCELO Date of Exam: 05/17/2021 Medical Rec #:  [id number]     Height:       65.0 in Accession #:    [id number]    Weight:       215.0 lb Date of Birth:  08/11/1945      BSA:          2.040 m Patient Age:    34 years      BP:           130/58 mmHg Patient Gender: F             HR:           85 bpm. Exam Location:  [address] Procedure: 2D Echo, Cardiac Doppler and Color Doppler Indications:    Z95.2 Status post TAVR  History:  Patient has prior history of Echocardiogram examinations, most                 recent 04/19/2021. CAD, Status post TAVR-23 mm Kise S3U,                 COPD; Risk Factors:Sleep Apnea, Dyslipidemia, Diabetes and                 Hypertension.  Sonographer:    Cresenciano Lick RDCS Referring Phys: [phone] JILL D MCDANIEL IMPRESSIONS  1. Left ventricular ejection fraction, by estimation, is 55 to 60%. The left ventricle has normal function. The left ventricle has no regional wall motion abnormalities. There is moderate left ventricular hypertrophy. Left ventricular diastolic parameters are consistent with Grade I diastolic dysfunction (impaired relaxation).  2. Right ventricular systolic function is normal. The right ventricular size is normal. There is normal pulmonary artery systolic pressure. The estimated right ventricular systolic pressure is 01.7 mmHg.  3. Left atrial size was mildly dilated.  4. Right atrial size was mildly dilated.  5. The mitral valve is degenerative. No evidence  of mitral valve regurgitation. Moderate to severe mitral stenosis. The mean mitral valve gradient is 9.0 mmHg with MVA by VTI 1.12 cm^2. Moderate mitral annular calcification with extensive valvular calcification.  6. Bioprosthetic aortic valve s/p TAVR. 23 mm Gracie Sapien THV. No perivalvular leakage noted. Mean gradient 33 mmHg is significantly elevated, dimensionless index 0.31. Would suggest TEE or gated CTA to more closely evaluate bioprosthetic valve stenosis.  7. Aortic dilatation noted. There is mild dilatation of the ascending aorta, measuring 40 mm.  8. The inferior vena cava is normal in size with greater than 50% respiratory variability, suggesting right atrial pressure of 3 mmHg. FINDINGS  Left Ventricle: Left ventricular ejection fraction, by estimation, is 55 to 60%. The left ventricle has normal function. The left ventricle has no regional wall motion abnormalities. The left ventricular internal cavity size was normal in size. There is  moderate left ventricular hypertrophy. Left ventricular diastolic parameters are consistent with Grade I diastolic dysfunction (impaired relaxation). Right Ventricle: The right ventricular size is normal. No increase in right ventricular wall thickness. Right ventricular systolic function is normal. There is normal pulmonary artery systolic pressure. The tricuspid regurgitant velocity is 2.55 m/s, and  with an assumed right atrial pressure of 3 mmHg, the estimated right ventricular systolic pressure is 51.0 mmHg. Left Atrium: Left atrial size was mildly dilated. Right Atrium: Right atrial size was mildly dilated. Pericardium: Trivial pericardial effusion is present. Mitral Valve: The mitral valve is degenerative in appearance. There is severe calcification of the mitral valve leaflet(s). Moderate mitral annular calcification. No evidence of mitral valve regurgitation. Moderate to severe mitral valve stenosis. MV peak gradient, 14.3 mmHg. The mean mitral valve  gradient is 9.0 mmHg. Tricuspid Valve: The tricuspid valve is normal in structure. Tricuspid valve regurgitation is trivial. Aortic Valve: Bioprosthetic aortic valve s/p TAVR. 23 mm Olenick Sapien THV. No perivalvular leakage noted. Mean gradient 33 mmHg is significantly elevated, dimensionless index 0.31. Would suggest TEE or gated CTA to more closely evaluate bioprosthetic valve stenosis. The aortic valve has been repaired/replaced. Aortic valve regurgitation is not visualized. Aortic valve mean gradient measures 33.0 mmHg. Aortic valve peak gradient measures 34.5 mmHg. Aortic valve area, by VTI measures 1.02 cm. Pulmonic Valve: The pulmonic valve was normal in structure. Pulmonic valve regurgitation is not visualized. Aorta: Aortic dilatation noted. There is mild dilatation of the ascending aorta, measuring 40 mm.  Venous: The inferior vena cava is normal in size with greater than 50% respiratory variability, suggesting right atrial pressure of 3 mmHg. IAS/Shunts: No atrial level shunt detected by color flow Doppler.  LEFT VENTRICLE PLAX 2D LVIDd:         4.60 cm   Diastology LVIDs:         3.00 cm   LV e' medial:    6.09 cm/s LV PW:         1.20 cm   LV E/e' medial:  25.9 LV IVS:        1.20 cm   LV e' lateral:   7.62 cm/s LVOT diam:     1.90 cm   LV E/e' lateral: 20.7 LV SV:         57 LV SV Index:   28 LVOT Area:     2.84 cm  RIGHT VENTRICLE RV Basal diam:  4.00 cm RV S prime:     12.70 cm/s TAPSE (M-mode): 2.2 cm LEFT ATRIUM             Index        RIGHT ATRIUM           Index LA diam:        5.10 cm 2.50 cm/m   RA Area:     19.50 cm LA Vol (A2C):   86.4 ml 42.36 ml/m  RA Volume:   62.50 ml  30.64 ml/m LA Vol (A4C):   54.3 ml 26.62 ml/m LA Biplane Vol: 72.6 ml 35.59 ml/m  AORTIC VALVE AV Area (Vmax):    1.02 cm AV Area (Vmean):   0.95 cm AV Area (VTI):     1.02 cm AV Vmax:           293.80 cm/s AV Vmean:          218.200 cm/s AV VTI:            0.553 m AV Peak Grad:      34.5 mmHg AV Mean Grad:       33.0 mmHg LVOT Vmax:         105.50 cm/s LVOT Vmean:        73.000 cm/s LVOT VTI:          0.200 m LVOT/AV VTI ratio: 0.36  AORTA Ao Root diam: 3.50 cm Ao Asc diam:  4.00 cm MITRAL VALVE                TRICUSPID VALVE MV Area (PHT): 3.34 cm     TR Peak grad:   26.0 mmHg MV Area VTI:   1.12 cm     TR Vmax:        255.00 cm/s MV Peak grad:  14.3 mmHg MV Mean grad:  9.0 mmHg     SHUNTS MV Vmax:       1.89 m/s     Systemic VTI:  0.20 m MV Vmean:      145.5 cm/s   Systemic Diam: 1.90 cm MV VTI:        0.50 m MV Decel Time: 227 msec MV E velocity: 157.50 cm/s MV A velocity: 177.00 cm/s MV E/A ratio:  0.89 Dalton McleanMD Electronically signed by Franki Monte Signature Date/Time: 05/17/2021/6:15:59 PM    Final       ASSESSMENT/PLAN   Acute on chronic hypoxemic respiratory faiulre  -patient has severe multi vessel CAD based on cardiac cath few months ago  - acutely there is evidence of pulmonary edema  on CT chest.  -she had episode of respiratory distress and I spoke to RN who takes care of her who explained this was panic attack and patient admits to severe anxiety. She responded well to precedex and confirts DNR/DNI status -patient does have moderate pulmonary hypertension on RHC -CTPE is negative for PE -will consider sildenafil TID for pulmonary hypertension once more diruresed -vital were rechecked during my examination and were stable  Moderate pulmonary hypertension   - sildenafil 20 TID post diuresis  Acute pulmonary edema   -recheck TTE   Acute exacerbation of COPD with chronic hypoxemia   - continue iv solumedrol 20 IV daily   GI/DVT ppx  Electrolyte derrangements - pharmacy consult      Ottie Glazier, M.D.  Division of [place]

## 2021-06-07 NOTE — Consult Note (Signed)
Pharmacy Antibiotic Note  [name] is a 76 y.o. female with medical history including T2DM, COPD, hypothyroidism, depression / anxiety, GERD, CAD s/p CABG, s/p TAVR, diverticulosis / history of diverticulitis, PUD CHF, admitted on 05/18/2021 with pneumonia. Patient was empirically started on ceftriaxone and azithromycin. However, on the morning of 06/07/21 patient was found to have increased work of breathing and to be increasingly anxious. Patient transferred to ICU for BiPAP. CXR with worsening airspace disease. Pharmacy has been consulted for meropenem dosing. Patient is also ordered linezolid.   Plan:  Meropenem 1 g IV q12h  Height: 5\' 6"  (167.6 cm) Weight: 97.6 kg (215 lb 2.7 oz) IBW/kg (Calculated) : 59.3  Temp (24hrs), Avg:98.5 F (36.9 C), Min:97 F (36.1 C), Max:99.3 F (37.4 C)  Recent Labs  Lab 05/25/2021 1808 06/04/21 0744 06/06/21 0550 06/07/21 0349 06/07/21 0843  WBC 14.6* 12.1* 14.3* 18.0*  --   CREATININE 1.39* 1.58* 1.58* 1.49*  --   LATICACIDVEN  --   --   --   --  1.6    Estimated Creatinine Clearance: 37.8 mL/min (A) (by C-G formula based on SCr of 1.49 mg/dL (H)).    Allergies  Allergen Reactions   Amitriptyline Other (See Comments)    Unknown reaction   Benadryl [Diphenhydramine] Shortness Of Breath   Demerol [Meperidine] Other (See Comments)    Unknown reaction   Gabapentin Other (See Comments)    Unknown reaction   Mirtazapine Other (See Comments)    Unknown reaction   Olanzapine Other (See Comments)    Unknown reaction    Voltaren [Diclofenac Sodium] Shortness Of Breath   Zetia [Ezetimibe] Other (See Comments)    Weakness in legs, shakiness all over   Ativan [Lorazepam] Other (See Comments)    Causes double vision at highter than .5 mg dose   Atorvastatin Other (See Comments)    Muscle aches and weakness   Budesonide-Formoterol Fumarate Other (See Comments)    Shakiness, tremors   Bupropion Hcl Other (See Comments)    "cloud over me"  depression   Caffeine Other (See Comments)    jitters   Codeine Sulfate Other (See Comments)    Makes chest hurt like a heart attack   Lisinopril Cough   Metformin Nausea And Vomiting   Mometasone Furoate Nausea And Vomiting   Morphine Sulfate Other (See Comments)    Chest pain like a heart attack   Other Other (See Comments)    Beta Blockers, reaction shortness of breath   Oxycodone-Acetaminophen Nausea And Vomiting   Pioglitazone Other (See Comments)    Cannot take because of risk of bladder cancer   Propoxyphene N-Acetaminophen Nausea And Vomiting   Rosuvastatin Other (See Comments)    Muscle aches and weakness   Shellfish Allergy Diarrhea   Suvorexant Other (See Comments)    Jerking/nervous    Ticagrelor     Other reaction(s): Other (See Comments) "slowed heart rate" & chest pain   Tramadol Nausea Only   Venlafaxine Other (See Comments)    Unknown reaction   Xanax [Alprazolam] Other (See Comments)    Mental status changes, "didn't know where she was at" per husband at bedside   Zolpidem Tartrate Other (See Comments)     Jittery, diarrhea   Latex Rash    Antimicrobials this admission: Azithromycin 1/1 >> 1/4 Ceftriaxone 1/1 >> 1/3 Linezolid 1/4 >>  Meropenem 1/4 >>   Dose adjustments this admission: N/A  Microbiology results: 1/4 Expanded respiratory panel: pending 1/4 MRSA PCR: (-)  Thank you for allowing pharmacy to be a part of this patients care.  Benita Gutter 06/07/2021 9:27 AM

## 2021-06-07 NOTE — Progress Notes (Signed)
OT Cancellation Note  Patient Details Name: [name] MRN: [id number] DOB: Feb 17, 1946   Cancelled Treatment:    Reason Eval/Treat Not Completed: Medical issues which prohibited therapy;Other (comment). OT order received. Chart reviewed. Pt noted to have recently t/f to higher level of care. Now on bipap. Not medically appropriate for OT evaluation. No continue upon transfer order in place. Will complete current order. OT will require new orders when pt medically stabilized/appropriate for OT evaluation.   Shara Blazing, M.S., OTR/L Feeding Team - [place] [facility] Ascom: [phone] 06/07/21, 9:21 AM

## 2021-06-07 NOTE — Progress Notes (Signed)
PHARMACIST - PHYSICIAN COMMUNICATION  CONCERNING:  Enoxaparin (Lovenox) for DVT Prophylaxis    RECOMMENDATION: Patient was prescribed enoxaprin 40mg  q24 hours for VTE prophylaxis.   Filed Weights   06/07/21 0500 06/07/21 0747  Weight: 98.1 kg (216 lb 4.3 oz) 97.6 kg (215 lb 2.7 oz)    Body mass index is 34.73 kg/m.  Estimated Creatinine Clearance: 37.8 mL/min (A) (by C-G formula based on SCr of 1.49 mg/dL (H)).   Based on [place] patient is candidate for enoxaparin 0.5mg /kg TBW SQ every 24 hours based on BMI being >30.  DESCRIPTION: Pharmacy has adjusted enoxaparin dose per [facility] policy.  Patient is now receiving enoxaparin 50 mg every 24 hours    Berta Minor, PharmD Clinical Pharmacist  06/07/2021 6:47 PM

## 2021-06-07 NOTE — Progress Notes (Signed)
SLP Cancellation Note  Patient Details Name: [name] MRN: [id number] DOB: Oct 28, 1945   Cancelled treatment:       Reason Eval/Treat Not Completed: Medical issues which prohibited therapy;Patient not medically ready (chart reviewed; consulted NSG re: pt's status this AM) Patient presents to [facility] on 05/04/2021 with acute on chronic malaise, anxiety dis. W/ Recurrent major depressive disorder, in partial remission, weakness, dyspnea with exertion and at rest.  Patient's husband at bedside indicates that after her recent surgery and TAVR in November, she has had worsening weakness, has essentially become bed bound and wheelchair dependent, she can ambulate very short distances, up to 10 feet. Severe acute hypoxia. Chest x-ray obtained revealed bilateral diffuse pulmonary infiltrates.  This morning, she became anxious w/ difficulty breathing. She received Ativan and was placed on BIPAP. NSG stated pt was not coming off of the BIPAP in the immediate future.  ST services will f/u tomorrow for BSE.      Orinda Kenner, MS, CCC-SLP Speech Language Pathologist Rehab Services [phone] [facility] 06/07/2021, 11:00 AM

## 2021-06-07 NOTE — Progress Notes (Signed)
Called to patients bedside at [phone] due to complaints of anxiety and increased work of breathing. Patients O2 stats were 69 on 4L. BP 137/55 HR 70s RR 30s. Patient placed on NRB and Dr Renetta Chalk arrived at bedside. Patients sats improved to the 90s following NRB and breathing treatment. Patient increasingly anxious pulling at oxygen mask demanding water. Orders for chest xray, lasixs , fentanyl, and transfer to stepdown. Patient transferred to ICU and placed on BIPAP.

## 2021-06-07 NOTE — Progress Notes (Addendum)
PROGRESS NOTE    [name]  OTL:[id number] DOB: 1945/11/25 DOA: 05/31/2021 PCP: Rusty Aus, MD   Brief Narrative:  [name] is a 76 y.o. 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  Patient presents to [facility] on 05/27/2021 with acute on chronic malaise weakness dyspnea with exertion and at rest.  Patient's husband at bedside indicates that after her recent surgery and TAVR in November she has had worsening weakness, has essentially become bed bound and wheelchair dependent, she can ambulate very short distances, up to 10 feet.  Given patient's severe acute hypoxia, malaise, generalized weakness, elevated BNP and D-dimer, hospitalist was called for admission, cardiology requested for consult.  06/07/2021: Patient was seen and examined at bedside.  In respiratory distress on nonrebreather.  Chest x-ray obtained revealed bilateral diffuse pulmonary infiltrates.  Per husband at bedside she has trouble swallowing and sometimes chokes on her food.  Patient on Rocephin and azithromycin, switched to Merrem and linezolid for broader coverage.  ABG obtained and patient was placed on BiPAP and transferred to the unit for close monitoring.  DNR CODE STATUS was confirmed by her husband at bedside.  Assessment & Plan:   Worsening acute hypoxic respiratory failure, multifactorial, POA Severe COPD exacerbation likely secondary to atypical pneumonia, POA Rule out  cardiogenic etiology -Cardiology consulted given recent TAVR -indicate respiratory status is not likely cardiogenic in nature.  -Echocardiogram - appropriate TAVR and LV function.  -Chest x-ray shows diffuse bilateral infiltrate concerning for viral etiology although COVID and flu remain negative we will treat supportively, continue antibiotics (ceftriaxone and azithromycin) for possible underlying bacterial pneumonia  Obtain procalcitonin level and lactic acid. Obtain BNP Obtain RSV screening test -D-dimer slightly elevated but CTA negative for PE, DVT studies negative as well  Acute diastolic CHF Elevated BNP on presentation greater than 300 Increased pulmonary vascularity seen on chest x-ray, personally reviewed. Repeat BMP on 06/07/2021 Received doses of IV Lasix due to acute respiratory distress. Strict I's and O's and daily weight Replete electrolytes as indicated Closely monitor on stepdown unit.  Ambulatory dysfunction, frequent falls, left knee ecchymosis, POA -PT OT to follow once medically stable for ambulation -Left knee imaging unremarkable for fracture  Acute symptomatic anemia, ongoing/anemia of chronic disease -Baseline iron deficiency anemia with chronic anemia of chronic disease  FOBT negative -1 unit PRBC transfused on 06/06/2021, repeat hemoglobin 7.8. No overt bleeding.  Insulin-dependent diabetes mellitus type 2 -Continue sliding scale insulin, hypoglycemic protocol  -Home insulin 100 units at bedtime, will continue as long as patient's appetite remains appropriate Hemoglobin A1c 6.2 on 04/14/2021. Avoid hypoglycemia  Depression, Generalized anxiety disorder, Anxiety - Resume home duloxetine 20 mg every morning, fluoxetine 40 mg twice daily; Judicious benzodiazepine in the setting of respiratory distress.  Insomnia-trazodone on hold due to drug drug interaction with linezolid. Hypothyroid-continue home levothyroxine 125 mcg daily GERD-continue PPI  Goals of  care Palliative care team consulted to assist with establishing goals of care. DNR  Critical care time: 65 minutes.  DVT prophylaxis: Lovenox subcu daily. Code Status: DNR Family Communication: Updated her husband at bedside who confirmed the CODE STATUS of DNR.  Status is: Inpatient  Dispo: The patient is from: Home              Anticipated d/c is to: To be determined -likely SNF given profound dyspnea with minimal exertion.              Anticipated d/c date is: 72+ hours              Patient currently not medically stable for discharge  Consultants:  Cardiology Palliative care team.  Procedures:  None planned  Antimicrobials:  Azithromycin, ceftriaxone x5 days, completed on 06/07/2021. Meropenem, linezolid started on 06/07/2021.  Objective: Vitals:   06/07/21 0500 06/07/21 0533 06/07/21 0747 06/07/21 0754  BP:  (!) 144/63 (!) 177/82   Pulse:  84 94 75  Resp:  (!) 28 (!) 28 (!) 30  Temp:  98.5 F (36.9 C) (!) 97 F (36.1 C)   TempSrc:   Axillary   SpO2:  91% (!) 82% 100%  Weight: 98.1 kg  97.6 kg   Height:   5\' 6"  (1.676 m)     Intake/Output Summary (Last 24 hours) at 06/07/2021 0927 Last data filed at 06/07/2021 0759 Gross per 24 hour  Intake 1601.05 ml  Output 1600 ml  Net 1.05 ml   Filed Weights   06/07/21 0500 06/07/21 0747  Weight: 98.1 kg 97.6 kg    Examination:  General: Well-developed well-nourished in acute respiratory distress.  On nonrebreather. HEENT: Normocephalic atraumatic.  Noninjected.  Extraocular movements intact bilaterally. Neck: Trachea is midline.   Lungs: Diffuse rales bilaterally.  Poor inspiratory effort.   Heart: Regular rate and rhythm no rubs or gallops.  Abdomen: Soft nontender normal bowel sounds present.  Extremities: Trace lower extremity edema bilaterally.  Vascular: Peripheral pulses in all 4 extremities.   Skin: Skin is warm and dry. Psych: Mood is anxious. Neuro: Moves all 4 extremities.  Nonfocal exam.   Data  Reviewed: I have personally reviewed following labs and imaging studies  CBC: Recent Labs  Lab 06/01/2021 1808 06/04/21 0744 06/06/21 0550 06/07/21 0349  WBC 14.6* 12.1* 14.3* 18.0*  HGB 7.5* 7.4* 6.9* 7.8*  HCT 24.9* 25.0* 22.7* 24.4*  MCV 82.5 81.7 80.5 80.5  PLT 213 216 194 921   Basic Metabolic Panel: Recent Labs  Lab 05/20/2021 1808 06/04/21 0744 06/06/21 0550 06/07/21 0349  NA 134* 134* 130* 132*  K 3.7 4.3 4.2 4.6  CL 99 96* 96* 98  CO2 25 26 26 27   GLUCOSE 120* 195* 164* 250*  BUN 28* 29* 32* 32*  CREATININE 1.39* 1.58* 1.58* 1.49*  CALCIUM 7.8* 8.0* 7.7* 7.9*   GFR: Estimated Creatinine Clearance: 37.8 mL/min (A) (by C-G formula based on SCr of 1.49 mg/dL (H)). Liver Function Tests: No results for input(s): AST, ALT, ALKPHOS, BILITOT, PROT, ALBUMIN in the last 168 hours. No results for input(s): LIPASE, AMYLASE in the last 168 hours. No results for input(s): AMMONIA in the last 168 hours. Coagulation Profile: No results for input(s): INR, PROTIME in the last 168 hours. Cardiac Enzymes: No results for input(s): CKTOTAL, CKMB, CKMBINDEX, TROPONINI in the last 168 hours. BNP (last 3 results) No results for input(s): PROBNP in the last 8760 hours. HbA1C: No results for input(s): HGBA1C in the last 72 hours. CBG: Recent Labs  Lab 06/06/21 0720 06/06/21 1142 06/06/21 1639 06/06/21 2139 06/07/21 0749  GLUCAP 139*  268* 129* 381* 209*   Lipid Profile: No results for input(s): CHOL, HDL, LDLCALC, TRIG, CHOLHDL, LDLDIRECT in the last 72 hours. Thyroid Function Tests: No results for input(s): TSH, T4TOTAL, FREET4, T3FREE, THYROIDAB in the last 72 hours. Anemia Panel: No results for input(s): VITAMINB12, FOLATE, FERRITIN, TIBC, IRON, RETICCTPCT in the last 72 hours. Sepsis Labs: Recent Labs  Lab 05/12/2021 1808 06/07/21 0843  PROCALCITON 0.17  --   LATICACIDVEN  --  1.6    Recent Results (from the past 240 hour(s))  Resp Panel by RT-PCR (Flu A&B, Covid)  Nasopharyngeal Swab     Status: None   Collection Time: 06/04/21  8:32 AM   Specimen: Nasopharyngeal Swab; Nasopharyngeal(NP) swabs in vial transport medium  Result Value Ref Range Status   SARS Coronavirus 2 by RT PCR NEGATIVE NEGATIVE Final    Comment: (NOTE) SARS-CoV-2 target nucleic acids are NOT DETECTED.  The SARS-CoV-2 RNA is generally detectable in upper respiratory specimens during the acute phase of infection. The lowest concentration of SARS-CoV-2 viral copies this assay can detect is 138 copies/mL. A negative result does not preclude SARS-Cov-2 infection and should not be used as the sole basis for treatment or other patient management decisions. A negative result may occur with  improper specimen collection/handling, submission of specimen other than nasopharyngeal swab, presence of viral mutation(s) within the areas targeted by this assay, and inadequate number of viral copies(<138 copies/mL). A negative result must be combined with clinical observations, patient history, and epidemiological information. The expected result is Negative.  Fact Sheet for Patients:  EntrepreneurPulse.com.au  Fact Sheet for Healthcare Providers:  IncredibleEmployment.be  This test is no t yet approved or cleared by the [place] FDA and  has been authorized for detection and/or diagnosis of SARS-CoV-2 by FDA under an Emergency Use Authorization (EUA). This EUA will remain  in effect (meaning this test can be used) for the duration of the COVID-19 declaration under Section 564(b)(1) of the Act, 21 U.S.C.section 360bbb-3(b)(1), unless the authorization is terminated  or revoked sooner.       Influenza A by PCR NEGATIVE NEGATIVE Final   Influenza B by PCR NEGATIVE NEGATIVE Final    Comment: (NOTE) The Xpert Xpress SARS-CoV-2/FLU/RSV plus assay is intended as an aid in the diagnosis of influenza from Nasopharyngeal swab specimens and should not be  used as a sole basis for treatment. Nasal washings and aspirates are unacceptable for Xpert Xpress SARS-CoV-2/FLU/RSV testing.  Fact Sheet for Patients: EntrepreneurPulse.com.au  Fact Sheet for Healthcare Providers: IncredibleEmployment.be  This test is not yet approved or cleared by the [place] FDA and has been authorized for detection and/or diagnosis of SARS-CoV-2 by FDA under an Emergency Use Authorization (EUA). This EUA will remain in effect (meaning this test can be used) for the duration of the COVID-19 declaration under Section 564(b)(1) of the Act, 21 U.S.C. section 360bbb-3(b)(1), unless the authorization is terminated or revoked.  Performed at [facility], [address] [place], [place] [address]   MRSA Next Gen by PCR, Nasal     Status: None   Collection Time: 06/07/21  8:00 AM   Specimen: Nasal Mucosa; Nasal Swab  Result Value Ref Range Status   MRSA by PCR Next Gen NOT DETECTED NOT DETECTED Final    Comment: (NOTE) The GeneXpert MRSA Assay (FDA approved for NASAL specimens only), is one component of a comprehensive MRSA colonization surveillance program. It is not intended to diagnose MRSA infection nor to guide or monitor treatment  for MRSA infections. Test performance is not FDA approved in patients less than 36 years old. 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 EXAM: PORTABLE CHEST - 1 VIEW COMPARISON:  05/08/2021 FINDINGS: Interval progression of extensive bilateral asymmetric airspace opacities most confluent in the right mid and upper lung, and left lower lung, with  enlarging focal opacity centrally in the left upper lung and worsening interstitial and airspace opacities at the right lung base. Heart size upper limits normal for technique. Previous AVR. CABG markers. Blunting of lateral costophrenic angles suggesting effusions. No pneumothorax. Sternotomy wires. IMPRESSION: 1. Worsening asymmetric extensive airspace infiltrates or edema. 2. Suspect small pleural effusions. Electronically Signed   By: Lucrezia Europe M.D.   On: 06/07/2021 07:51        Scheduled Meds:  azelastine  1 spray Each Nare BID   Chlorhexidine Gluconate Cloth  6 each Topical Daily   clopidogrel  75 mg Oral Daily   DULoxetine  20 mg Oral Q breakfast   FLUoxetine  40 mg Oral BID   furosemide  20 mg Intravenous BID   insulin aspart  0-15 Units Subcutaneous TID WC   insulin aspart  0-5 Units Subcutaneous QHS   insulin glargine-yfgn  100 Units Subcutaneous QHS   levothyroxine  125 mcg Oral Q0600   pantoprazole  40 mg Oral Q breakfast   Continuous Infusions:  linezolid (ZYVOX) IV     meropenem (MERREM) IV      LOS: 3 days   Time spent: 36min  Kayleen Memos, DO [facility]  If 7PM-7AM, please contact night-coverage [email or web address]  06/07/2021, 9:27 AM

## 2021-06-07 NOTE — Consult Note (Addendum)
Consultation Note Date: 06/07/2021   Patient Name: [name]  DOB: Jan 02, 1946  MRN: [id number]  Age / Sex: 76 y.o., female  PCP: Rusty Aus, MD Referring Physician: Kayleen Memos, DO  Reason for Consultation: Establishing goals of care  HPI/Patient Profile: 76 y.o. 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  Patient found to have bilateral diffuse pulmonary infiltrates.  On 1/4 in AM patient became anxious and had difficulty breathing.  Patient currently receiving IV Ativan and using BiPAP continuously with increased agitation and anxiety.  Speech and language therapy consulted but unable to perform BSE today.    Palliative medicine consulted for discussion of goals of care.  Clinical Assessment and Goals of Care: I have reviewed medical records including EPIC notes, labs and imaging, assessed the patient and then met with patient at bedside.  She was in mild acute respiratory distress.  She was attempting to remove BiPAP and asking for sip of water.  Nursing at bedside and preparing to administer Ativan IV.  I did not attempt to discuss diagnosis prognosis, GOC, EOL wishes, disposition and options with patient.  Nursing shared that patient's husband had just left to get some rest.   I rounded on patient again and she continues to be agitated and anxious despite use of precedex and IV ativan.   I advised that a family meeting to be held to  determine [place].   1230 update: Patient'shusband and patient's daughter returned to hospital. Patient's sister is at bedside.   I introduced Palliative Medicine as specialized medical care for people living with serious illness. It focuses on providing relief from the symptoms and stress of a serious illness. The goal is to improve quality of life for both the patient and the family.  As far as functional and nutritional status family endorses patient was performing all ADLs independent prior to hospitalization.  They noticed a significant decline over the past several weeks in regards to her energy levels.  We discussed patient's current illness and what it means in the larger context of patient's on-going co-morbidities. I attempted to elicit values and goals of care important to the patient.  Husband shares that he would not want her to be "a vegetable" and to live on a ventilator.  DNR/DNI is to remain.  Husband inquired about nutrition since patient has not been able to eat or drink anything.  Risk and benefits of TPN and feeding tubes discussed.  Husband and daughter were in agreement that they would not want to move forward with TPN/feeding tube.  The difference between as well as pros and cons of aggressive medical  intervention and comfort care were reviewed in detail.  Education offered regarding concept specific to human mortality and the limitations of medical interventions to prolong life when the body begins to fail to thrive.    Husband asked appropriate questions in regards to chest x-rays, COVID test, and other medical tests.  Husband and daughter would like to allow more time for the patient to "turn the corner".  I conveyed the patient is suffering and that her current quality of life is poor.  I shared that we will continue all appropriate medical interventions to treat the treatable.  Also shared that we will continue to attempt to treat her agitation and anxiety with  medications.  However, given she continues to remove the mitts, the use of restraints may be necessary.  The husband said "do what you have to do".  Discussed with patient/family the importance of continued conversation with family and the medical providers regarding overall plan of care and treatment options, ensuring decisions are within the context of the patients values and GOCs.    Questions and concerns were addressed. The family was encouraged to call with questions or concerns.   PMT contact info given.  Palliative medicine team will continue to follow the patient throughout her hospitalization.  Primary Decision Maker NEXT OF KIN  Code Status/Advance Care Planning: DNR Treat the treatable - continue current are Time for outcomes - family would like 48-72 hours to see if patient improves before making changes to plan of care Fentanyl 12.16mg IV PRN Q2H - CLinicain reviewed - Dr. ALanney Ginsin agreement to try low dose narcotic, I also discussed potential allergy with family. Pt tolerated Fentanyl without complication earlier today. Family and Dr. ALanney Ginsin agreement to move forward with Fentanyl for better WOB and pain management  Prognosis:   Unable to determine  Discharge Planning: To Be Determined  Primary Diagnoses: Present on Admission:  Shortness of breath  Depression with anxiety  Dyslipidemia-statin ontol  Acquired hypothyroidism  Obstructive sleep apnea  Essential hypertension  Dyspnea  Insomnia  Aortic valve disorder  Coronary Artery Disease s/p CABG 10/2013  Stenosis of coronary artery stent  Recurrent major depressive disorder, in partial remission (HCC)  Leukocytosis  COPD exacerbation (HCC)  GAD (generalized anxiety disorder)  Chronic obstructive pulmonary disease [place]   Physical Exam Vitals and nursing note reviewed.  Constitutional:      General: She is in acute distress.  HENT:     Head: Normocephalic.  Cardiovascular:     Rate and Rhythm:  Normal rate.  Pulmonary:     Effort: Tachypnea and accessory muscle usage present.     Breath sounds: Decreased breath sounds present.  Abdominal:     Palpations: Abdomen is soft.  Musculoskeletal:     Comments: Generalized weakness  Skin:    General: Skin is warm and dry.  Neurological:     Comments: Nonverbal, moaning/groaning  Psychiatric:        Mood and Affect: Mood is anxious.        Behavior: Behavior is agitated.    Vital Signs: BP 136/77    Pulse (!) 101    Temp (!) 97 F (36.1 C) (Axillary)    Resp (!) 30    Ht _0  (1.676 m)    Wt 97.6 kg    SpO2 100%    BMI 34.73 kg/m  Pain Scale: 0-10   Pain Score: 0-No pain SpO2: SpO2: 100 % O2 Device:SpO2: 100 % O2 Flow Rate: .O2 Flow  Rate (L/min): 4 L/min  Palliative Assessment/Data: 20%     I discussed this patient's plan of care with Dr. Lanney Gins, RN Raquel Sarna, patient, patient's husband, daughter, and sister.  Thank you for this consult. Palliative medicine will continue to follow and assist holistically.   Time Total: 130 minutes Greater than 50%  of this time was spent counseling and coordinating care related to the above assessment and plan.  Signed by: Jordan Hawks, DNP, FNP-BC Palliative Medicine    Please contact Palliative Medicine Team phone at [phone] for questions and concerns.  For individual provider: See Shea Evans

## 2021-06-07 NOTE — Progress Notes (Signed)
PT Cancellation Note  Patient Details Name: ALANY BORMAN MRN: [id number] DOB: [birth year]   Cancelled Treatment:    Reason Eval/Treat Not Completed: Other (comment). Pt noted to have recently t/f to higher level of care. Now on bipap. No continue upon transfer order in place. Will complete current order. PT will require new orders when pt medically stabilized/appropriate for PT re-evaluation.   Lieutenant Diego PT, DPT 11:16 AM,06/07/21

## 2021-06-08 ENCOUNTER — Inpatient Hospital Stay: Payer: Medicare HMO

## 2021-06-08 DIAGNOSIS — R0602 Shortness of breath: Secondary | ICD-10-CM

## 2021-06-08 LAB — CBC WITH DIFFERENTIAL/PLATELET
Abs Immature Granulocytes: 0.08 10*3/uL — ABNORMAL HIGH (ref 0.00–0.07)
Basophils Absolute: 0.1 10*3/uL (ref 0.0–0.1)
Basophils Relative: 0 %
Eosinophils Absolute: 0.2 10*3/uL (ref 0.0–0.5)
Eosinophils Relative: 2 %
HCT: 23.5 % — ABNORMAL LOW (ref 36.0–46.0)
Hemoglobin: 7.2 g/dL — ABNORMAL LOW (ref 12.0–15.0)
Immature Granulocytes: 1 %
Lymphocytes Relative: 7 %
Lymphs Abs: 0.9 10*3/uL (ref 0.7–4.0)
MCH: 25.3 pg — ABNORMAL LOW (ref 26.0–34.0)
MCHC: 30.6 g/dL (ref 30.0–36.0)
MCV: 82.5 fL (ref 80.0–100.0)
Monocytes Absolute: 0.6 10*3/uL (ref 0.1–1.0)
Monocytes Relative: 5 %
Neutro Abs: 11.2 10*3/uL — ABNORMAL HIGH (ref 1.7–7.7)
Neutrophils Relative %: 85 %
Platelets: 195 10*3/uL (ref 150–400)
RBC: 2.85 MIL/uL — ABNORMAL LOW (ref 3.87–5.11)
RDW: 15.9 % — ABNORMAL HIGH (ref 11.5–15.5)
WBC: 13.1 10*3/uL — ABNORMAL HIGH (ref 4.0–10.5)
nRBC: 0 % (ref 0.0–0.2)

## 2021-06-08 LAB — COMPREHENSIVE METABOLIC PANEL
ALT: 16 U/L (ref 0–44)
AST: 21 U/L (ref 15–41)
Albumin: 2.6 g/dL — ABNORMAL LOW (ref 3.5–5.0)
Alkaline Phosphatase: 129 U/L — ABNORMAL HIGH (ref 38–126)
Anion gap: 12 (ref 5–15)
BUN: 40 mg/dL — ABNORMAL HIGH (ref 8–23)
CO2: 27 mmol/L (ref 22–32)
Calcium: 8.1 mg/dL — ABNORMAL LOW (ref 8.9–10.3)
Chloride: 99 mmol/L (ref 98–111)
Creatinine, Ser: 1.72 mg/dL — ABNORMAL HIGH (ref 0.44–1.00)
GFR, Estimated: 30 mL/min — ABNORMAL LOW (ref >60)
Glucose, Bld: 98 mg/dL (ref 70–99)
Potassium: 4.5 mmol/L (ref 3.5–5.1)
Sodium: 138 mmol/L (ref 135–145)
Total Bilirubin: 0.9 mg/dL (ref 0.3–1.2)
Total Protein: 6.2 g/dL — ABNORMAL LOW (ref 6.5–8.1)

## 2021-06-08 LAB — LACTIC ACID, PLASMA: Lactic Acid, Venous: 0.8 mmol/L (ref 0.5–1.9)

## 2021-06-08 LAB — MAGNESIUM: Magnesium: 2.3 mg/dL (ref 1.7–2.4)

## 2021-06-08 LAB — GLUCOSE, CAPILLARY
Glucose-Capillary: 89 mg/dL (ref 70–99)
Glucose-Capillary: 70 mg/dL (ref 70–99)
Glucose-Capillary: 56 mg/dL — ABNORMAL LOW (ref 70–99)
Glucose-Capillary: 117 mg/dL — ABNORMAL HIGH (ref 70–99)
Glucose-Capillary: 94 mg/dL (ref 70–99)

## 2021-06-08 LAB — PHOSPHORUS: Phosphorus: 6.1 mg/dL — ABNORMAL HIGH (ref 2.5–4.6)

## 2021-06-08 LAB — PROCALCITONIN: Procalcitonin: 2.14 ng/mL

## 2021-06-08 MED ORDER — FENTANYL CITRATE PF 50 MCG/ML IJ SOSY
12.5000 ug | PREFILLED_SYRINGE | Freq: Once | INTRAMUSCULAR | Status: AC
  Administered 2021-06-08: 12.5 ug via INTRAVENOUS

## 2021-06-08 MED ORDER — FENTANYL CITRATE PF 50 MCG/ML IJ SOSY
25.0000 ug | PREFILLED_SYRINGE | INTRAMUSCULAR | Status: DC | PRN
  Administered 2021-06-08 – 2021-06-10 (×6): 25 ug via INTRAVENOUS
  Filled 2021-06-08 (×7): qty 1

## 2021-06-08 MED ORDER — DEXTROSE 50 % IV SOLN
12.5000 g | INTRAVENOUS | Status: AC
  Administered 2021-06-08: 12.5 g via INTRAVENOUS
  Filled 2021-06-08: qty 50

## 2021-06-08 MED ORDER — SODIUM CHLORIDE 0.9 % IV SOLN
100.0000 mg | Freq: Two times a day (BID) | INTRAVENOUS | Status: AC
  Administered 2021-06-08 – 2021-06-11 (×8): 100 mg via INTRAVENOUS
  Filled 2021-06-08 (×8): qty 100

## 2021-06-08 MED ORDER — HALOPERIDOL LACTATE 5 MG/ML IJ SOLN
2.0000 mg | Freq: Once | INTRAMUSCULAR | Status: AC
  Administered 2021-06-08: 2 mg via INTRAVENOUS
  Filled 2021-06-08: qty 1

## 2021-06-08 NOTE — Progress Notes (Signed)
CRITICAL CARE         Date: 06/08/2021,   MRN# [id number] [name] 1946-05-19     AdmissionWeight: 98.1 kg                 CurrentWeight: 96.2 kg   Referring physician: Dr Jimmye Norman   CHIEF COMPLAINT:   Severe acute on chronic hypoxemic respiratory failure   HISTORY OF PRESENT ILLNESS   76 yo with hx of CABG 2015 , depression,anxiety, hypertension, OSA, COPD, iron deficiency anemia, aortic dilatation, presents emergency department for cc of dyspnea and sob. She is chronically hypoxemic with 3L/min [place] requirement. Came in for acute onset of sob/doe was negative for PE on admission. She is on BIPAP in resp distress.She denies recent fever, nausea, vomiting, abdominal pain, dysuria, diarrhea, hematuria, blood in her stool, syncope, lost of consciousness. She reports chest pain and severe SOB.She has severe anxiety and recurrent panic attacks She reports no new cough. She is unsure if she has had weight gain or lost. She denies flu like illness. She denies LE edema. PCCM consult placed for further evaluation and management.   06/08/21- patient is improved, she did have few episodes of mild bradycardia 40s. I Met with son and husband today to review medical plan and prognosis. They have met with palliative care today.    PAST MEDICAL HISTORY   Past Medical History:  Diagnosis Date   1st degree AV block    ACE-inhibitor cough    Allergic rhinitis    Anemia    iron deficiency anemia   Anxiety    Aortic ectasia (HCC)    a. CT abd in 12/2016 incidentally noted aortic atherosclerosis and infrarenal abdominal aortic ectasia measuring as large as 2.7 cm with recommendation to repeat US in 2023.   Aortic stenosis    a. 10/2013 s/p AVR; b. 04/2021 s/p TAVR.   Arthritis    Asthma    Cataract    Chronic depression    Chronic diastolic CHF (congestive heart failure) (HCC)    Chronic headache    CKD (chronic kidney disease), stage III (HCC)    COPD (chronic obstructive  pulmonary disease) (HCC)    Coronary artery disease    a. DES to RCA and mid Cx 2009. b. CABG x 2 and bioprosthetic AVR May 2015. c. cutting balloon to prox Cx in 05/2016; d. 11/2020 PCI/DES to LCX and VG->RCA (prox and distal); e. 02/2021 Cath: LM nl, LAD 159mCTO, D1 min irregs, LCX 50p/m ISR, patent mid stent, RCA 20ost, 80p, 103mRPDA nl, VG->dRCA patent p/d stents, LIMA->LAD patent.   Dementia (HCPierce   Diverticulitis of colon    Essential hypertension    GERD (gastroesophageal reflux disease)    Hearing loss    History of blood transfusion 2013   History of prosthetic aortic valve replacement    a. 10/2013 s/p bioprosthetic AVR; b. 04/2021 s/p TAVR w/ EdOletta Lamasapien 3 THV (232m c. 05/2021 Echo: EF 55-60%, no PV leak, 2m78mmean gradient; d. 06/2025-Jan-2023o: EF 55-60%, mod LVH. GrII DD. Nl RV size/fxn. Mildly dil LA. Small peric eff w/o tamponade. Triv MR. Mild-mod MS (mean grad 8mmH25m Nl fxn'ing AoV - peak grad 4.1mmHg29mean grad 2.0mmHg.16mA 4.24cm^2 (VTI).   HOH (hard of hearing)    Hypercholesterolemia    a. intolerance of statins and niaspan -   IDA (iron deficiency anemia) 02/03/2019   Mitral stenosis    a. 06/2021 01/27/23Mild-mod MS (mean  gradient 39mHg.   Mobitz type 1 second degree AV block    OSA (obstructive sleep apnea)    mild, intolerant of cpap   PAD (peripheral artery disease) (HBruni    a. atherosclerosis by CT abd 12/2016 in LE.   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 MSumner Boast& Dr. BCyndia Bent b. 05/2021 Echo: EF 55-60%, no PV leak, 347mg mean gradient; c. 1/Feb 07, 2023cho: EF 55-60%, mod LVH. GrII DD. Nl fxn'ing AoV - peak grad 4.39m22m, mean grad 2.0mm40m AVA 4.24cm^2 (VTI).   Statin intolerance    Thyroid disease    Type II diabetes mellitus (HCC)[place]   SURGICAL HISTORY   Past Surgical History:  Procedure Laterality Date    ABDOMINAL HYSTERECTOMY     ABDOMINAL HYSTERECTOMY W/ PARTIAL VAGINACTOMY     AORTIC VALVE REPLACEMENT N/A 10/12/2013   Procedure: AORTIC VALVE REPLACEMENT (AVR);  Surgeon: BryaGaye Pollack;  [facility];  Laterality: N/A;   APPENDECTOMY  1964   BARTHOLIN GLAND CYST EXCISION     BLADDER SUSPENSION     BREAST BIOPSY Bilateral 09/11/2000   neg   BREAST BIOPSY Left 07/24/2010   neg   BREAST CYST EXCISION  1988   bilateral nonmalignant tumors, x3   CARDIAC CATHETERIZATION     CARDIAC CATHETERIZATION N/A 05/25/2016   Procedure: Coronary Balloon Angioplasty;  Surgeon: DaviLeonie Man;  [facility];  Service: Cardiovascular;  Laterality: N/A;   CARDIAC CATHETERIZATION N/A 05/25/2016   Procedure: Coronary/Graft Angiography;  Surgeon: DaviLeonie Man;  [facility];  Service: Cardiovascular;  Laterality: N/A;   CATARACT EXTRACTION W/ INTRAOCULAR LENS  IMPLANT, BILATERAL     CHOLECYSTECTOMY  2001   COLECTOMY     lap sigmoid   COLONOSCOPY  2014   polyps found, 2 clamped off.   CORONARY ANGIOPLASTY  10/29/2007   Prox RCA & Mid Cx.   CORONARY ARTERY BYPASS GRAFT N/A 10/12/2013   Procedure: CORONARY ARTERY BYPASS GRAFT TIMES TWO;  Surgeon: BryaGaye Pollack;  [facility]  Service: Open Heart Surgery;  Laterality: N/A;   CORONARY/GRAFT ANGIOGRAPHY N/A 09/20/2017   Procedure: CORONARY/GRAFT ANGIOGRAPHY;  Surgeon: CoopSherren Mocha;  [facility];  Service: Cardiovascular;  Laterality: N/A;   INTRAOPERATIVE TRANSTHORACIC ECHOCARDIOGRAM N/A 04/18/2021   Procedure: INTRAOPERATIVE TRANSTHORACIC ECHOCARDIOGRAM;  Surgeon: McAlBurnell Blanks;  [facility];  Service: Open Heart Surgery;  Laterality: N/A;   LEFT HEART CATHETERIZATION WITH CORONARY ANGIOGRAM N/A 10/09/2013   Procedure: LEFT HEART CATHETERIZATION WITH CORONARY ANGIOGRAM;  Surgeon: ChriBurnell Blanks;  [facility];  Service: Cardiovascular;  Laterality: N/A;   RIGHT/LEFT HEART CATH AND CORONARY ANGIOGRAPHY N/A 11/03/2020   Procedure: RIGHT/LEFT HEART CATH AND CORONARY ANGIOGRAPHY;  Surgeon: KowaCorey Skains;  [facility];  Service: Cardiovascular;  Laterality: N/A;   RIGHT/LEFT HEART CATH AND CORONARY/GRAFT ANGIOGRAPHY N/A 02/10/2021   Procedure: RIGHT/LEFT HEART CATH AND CORONARY/GRAFT ANGIOGRAPHY;  Surgeon: OrgeAndrez Grime;  [facility];  Service: Cardiovascular;  Laterality: N/A;   STERNAL WIRES REMOVAL N/A 04/13/2014   Procedure: STERNAL WIRES REMOVAL;  Surgeon: BryaGaye Pollack;  [facility];  Laterality: N/A;  TEE WITHOUT CARDIOVERSION N/A 11/03/2020   Procedure: TRANSESOPHAGEAL ECHOCARDIOGRAM (TEE);  Surgeon: Corey Skains, MD;  Location: [facility];  Service: Cardiovascular;  Laterality: N/A;   TEE WITHOUT CARDIOVERSION N/A 02/13/2021   Procedure: TRANSESOPHAGEAL ECHOCARDIOGRAM (TEE);  Surgeon: Corey Skains, MD;  Location: [facility];  Service: Cardiovascular;  Laterality: N/A;   THYROIDECTOMY     TONSILLECTOMY     TRANSCATHETER AORTIC VALVE REPLACEMENT, TRANSFEMORAL N/A 04/18/2021   Procedure: TRANSCATHETER AORTIC VALVE REPLACEMENT, TRANSFEMORAL;  Surgeon: [name], MD;  Location: [place] [facility];  Service: Open Heart Surgery;  Laterality: N/A;   TUBAL LIGATION     VAGINAL DELIVERY     3   VISCERAL ARTERY INTERVENTION N/A 08/16/2016   Procedure: Visceral Artery Intervention;  Surgeon: Algernon Huxley, MD;  Location: [place] [facility];  Service: Cardiovascular;  Laterality: N/A;     FAMILY HISTORY   Family History  Problem Relation Age of Onset   Breast cancer Mother 46   Hypertension Father    Mesothelioma Father    Asthma Father    Stroke Paternal Grandfather    Heart disease Other    Breast cancer Maternal Aunt    Breast cancer Paternal Aunt      SOCIAL HISTORY   Social History    Tobacco Use   Smoking status: Former    Packs/day: 0.50    Years: 30.00    Pack years: 15.00    Types: Cigarettes    Quit date: 10/02/2013    Years since quitting: 7.6   Smokeless tobacco: Never  Vaping Use   Vaping Use: Never used  Substance Use Topics   Alcohol use: No   Drug use: No     MEDICATIONS    Home Medication:    Current Medication:  Current Facility-Administered Medications:    0.9 %  sodium chloride infusion, 250 mL, Intravenous, Continuous, Ottie Glazier, MD, Held at 06/07/21 1920   albuterol (PROVENTIL) (2.5 MG/3ML) 0.083% nebulizer solution 2.5 mg, 2.5 mg, Nebulization, Q6H PRN, Cox, Amy N, DO, 2.5 mg at 06/07/21 0204   azelastine (ASTELIN) 0.1 % nasal spray 1 spray, 1 spray, Each Nare, BID, Cox, Amy N, DO, 1 spray at 06/07/21 2132   benzonatate (TESSALON) capsule 200 mg, 200 mg, Oral, TID PRN, Cox, Amy N, DO, 200 mg at 06/07/21 0608   Chlorhexidine Gluconate Cloth 2 % PADS 6 each, 6 each, Topical, Daily, Kayleen Memos, DO, 6 each at 06/07/21 0849   clopidogrel (PLAVIX) tablet 75 mg, 75 mg, Oral, Daily, Cox, Amy N, DO, 75 mg at 06/06/21 0813   dexmedetomidine (PRECEDEX) 400 MCG/100ML (4 mcg/mL) infusion, 0.4-1.2 mcg/kg/hr, Intravenous, Titrated, Kyndal Gloster, MD, Last Rate: 29.3 mL/hr at 06/08/21 1022, 1.2 mcg/kg/hr at 06/08/21 1022   doxycycline (VIBRAMYCIN) 100 mg in sodium chloride 0.9 % 250 mL IVPB, 100 mg, Intravenous, Q12H, [place], Bing Neighbors, NP   DULoxetine (CYMBALTA) DR capsule 20 mg, 20 mg, Oral, Q breakfast, Cox, Amy N, DO, 20 mg at 06/06/21 0806   enoxaparin (LOVENOX) injection 50 mg, 0.5 mg/kg, Subcutaneous, Q24H, Hall, Carole N, DO, 50 mg at 06/07/21 2129   fentaNYL (SUBLIMAZE) injection 12.5 mcg, 12.5 mcg, Intravenous, Q2H PRN, Jordan Hawks, FNP, 12.5 mcg at 06/08/21 0847   FLUoxetine (PROZAC) capsule 40 mg, 40 mg, Oral, BID, Cox, Amy N, DO, 40 mg at 06/06/21 2007   insulin aspart (novoLOG) injection 0-15 Units, 0-15 Units,  Subcutaneous, TID WC, Cox, Amy N, DO, 3 Units at 06/07/21  1645   insulin aspart (novoLOG) injection 0-5 Units, 0-5 Units, Subcutaneous, QHS, Cox, Amy N, DO, 5 Units at 06/06/21 2307   insulin glargine-yfgn (SEMGLEE) injection 100 Units, 100 Units, Subcutaneous, QHS, Cox, Amy N, DO, 100 Units at 06/07/21 2209   levothyroxine (SYNTHROID) tablet 125 mcg, 125 mcg, Oral, Q0600, Cox, Amy N, DO, 125 mcg at 06/06/21 0805   LORazepam (ATIVAN) injection 0.5 mg, 0.5 mg, Intravenous, Q8H PRN, [place] Crane, Carole N, DO, 0.5 mg at 06/07/21 2041   MEDLINE mouth rinse, 15 mL, Mouth Rinse, q12n4p, Ottie Glazier, MD, 15 mL at 06/07/21 1951   methylPREDNISolone sodium succinate (SOLU-MEDROL) 40 mg/mL injection 20 mg, 20 mg, Intravenous, Daily, Lanney Gins, Chania Kochanski, MD   nitroGLYCERIN (NITROSTAT) SL tablet 0.4 mg, 0.4 mg, Sublingual, Q5 min PRN, Cox, Amy N, DO   norepinephrine (LEVOPHED) 52m in 2571m(0.016 mg/mL) premix infusion, 2-10 mcg/min, Intravenous, Titrated, Kimberlynn Lumbra, MD, Stopped at 06/07/21 2346   ondansetron (ZOFRAN) tablet 4 mg, 4 mg, Oral, Q6H PRN **OR** ondansetron (ZOFRAN) injection 4 mg, 4 mg, Intravenous, Q6H PRN, Cox, Amy N, DO, 4 mg at 06/06/21 2007   pantoprazole (PROTONIX) injection 40 mg, 40 mg, Intravenous, Daily, ChBenita GutterRPH, 40 mg at 06/07/21 1253    ALLERGIES   Amitriptyline, Benadryl [diphenhydramine], Demerol [meperidine], Gabapentin, Mirtazapine, Olanzapine, Voltaren [diclofenac sodium], Zetia [ezetimibe], Ativan [lorazepam], Atorvastatin, Budesonide-formoterol fumarate, Bupropion hcl, Caffeine, Codeine sulfate, Lisinopril, Metformin, Mometasone furoate, Morphine sulfate, Other, Oxycodone-acetaminophen, Pioglitazone, Propoxyphene n-acetaminophen, Rosuvastatin, Shellfish allergy, Suvorexant, Ticagrelor, Tramadol, Venlafaxine, Xanax [alprazolam], Zolpidem tartrate, and Latex     REVIEW OF SYSTEMS    Review of Systems:  Gen:  Denies  fever, sweats, chills weigh loss  HEENT:  Denies blurred vision, double vision, ear pain, eye pain, hearing loss, nose bleeds, sore throat Cardiac:  No dizziness, chest pain or heaviness, chest tightness,edema Resp:   Denies cough or sputum porduction, shortness of breath,wheezing, hemoptysis,  Gi: Denies swallowing difficulty, stomach pain, nausea or vomiting, diarrhea, constipation, bowel incontinence Gu:  Denies bladder incontinence, burning urine Ext:   Denies Joint pain, stiffness or swelling Skin: Denies  skin rash, easy bruising or bleeding or hives Endoc:  Denies polyuria, polydipsia , polyphagia or weight change Psych:   Denies depression, insomnia or hallucinations   Other:  All other systems negative   VS: BP (!) 97/54    Pulse 60    Temp 98.1 F (36.7 C) (Axillary)    Resp (!) 25    Ht _0  (1.676 m)    Wt 96.2 kg    SpO2 99%    BMI 34.23 kg/m      PHYSICAL EXAM    GENERAL:NAD, no fevers, chills, no weakness no fatigue HEAD: Normocephalic, atraumatic.  EYES: Pupils equal, round, reactive to light. Extraocular muscles intact. No scleral icterus.  MOUTH: Moist mucosal membrane. Dentition intact. No abscess noted.  EAR, NOSE, THROAT: Clear without exudates. No external lesions.  NECK: Supple. No thyromegaly. No nodules. No JVD.  PULMONARY: BIPAP lung sounds CARDIOVASCULAR: S1 and S2. Regular rate and rhythm. No murmurs, rubs, or gallops. No edema. Pedal pulses 2+ bilaterally.  GASTROINTESTINAL: Soft, nontender, nondistended. No masses. Positive bowel sounds. No hepatosplenomegaly.  MUSCULOSKELETAL: No swelling, clubbing, or edema. Range of motion full in all extremities.  NEUROLOGIC: Cranial nerves II through XII are intact. No gross focal neurological deficits. Sensation intact. Reflexes intact.  SKIN: No ulceration, lesions, rashes, or cyanosis. Skin warm and dry. Turgor intact.  PSYCHIATRIC: severely distressed  IMAGING    CT Angio Chest Pulmonary Embolism (PE) W or WO Contrast  Result Date:  06/04/2021 CLINICAL DATA:  Pulmonary embolism (PE) suspected, positive D-dimer EXAM: CT ANGIOGRAPHY CHEST WITH CONTRAST TECHNIQUE: Multidetector CT imaging of the chest was performed using the standard protocol during bolus administration of intravenous contrast. Multiplanar CT image reconstructions and MIPs were obtained to evaluate the vascular anatomy. CONTRAST:  28m OMNIPAQUE IOHEXOL 350 MG/ML SOLN COMPARISON:  02/01/2021 FINDINGS: Cardiovascular: Adequate opacification of the pulmonary arterial tree. No intraluminal filling defect identified to suggest acute pulmonary embolism. Central pulmonary arteries are enlarged in keeping with changes of pulmonary arterial hypertension, unchanged. Transcatheter aortic valve replacement has been performed. Multi-vessel coronary artery stenting has been performed as well as coronary artery bypass grafting. Cardiomegaly is present, stable. No pericardial effusion. Extensive atheromatous plaque is seen within the thoracic aorta demonstrating mural irregularity and numerous shallow ulcer like projections. No aortic aneurysm. Mediastinum/Nodes: The thyroid gland is absent or atretic. The esophagus is unremarkable. There is interval enlargement of multiple pathologically enlarged lymph nodes within the right paratracheal and prevascular lymph node groups which measure up to 19 mm in short axis diameter. Numerous shotty bilateral hilar lymph nodes are also identified. Lungs/Pleura: Extensive bilateral asymmetric ground-glass pulmonary infiltrate has developed, most in keeping with atypical infection in the acute setting. No pneumothorax or pleural effusion. No central obstructing lesion. Upper Abdomen: No acute abnormality. Musculoskeletal: No acute bone abnormality. No lytic or blastic bone lesion. Review of the MIP images confirms the above findings. IMPRESSION: No pulmonary embolism. Extensive multifocal pulmonary infiltrate most in keeping with atypical infection in the acute  setting. Stable cardiomegaly. Aortic Atherosclerosis (ICD10-I70.0). Electronically Signed   By: AFidela SalisburyM.D.   On: 06/04/2021 00:04   UKoreaVenous Img Lower Unilateral Left  Result Date: 05/11/2021 CLINICAL DATA:  Initial evaluation for acute swelling of left calf, evaluate for DVT. EXAM: Left LOWER EXTREMITY VENOUS DOPPLER ULTRASOUND TECHNIQUE: Gray-scale sonography with graded compression, as well as color Doppler and duplex ultrasound were performed to evaluate the lower extremity deep venous systems from the level of the common femoral vein and including the common femoral, femoral, profunda femoral, popliteal and calf veins including the posterior tibial, peroneal and gastrocnemius veins when visible. The superficial great saphenous vein was also interrogated. Spectral Doppler was utilized to evaluate flow at rest and with distal augmentation maneuvers in the common femoral, femoral and popliteal veins. COMPARISON:  None. FINDINGS: Contralateral Common Femoral Vein: Respiratory phasicity is normal and symmetric with the symptomatic side. No evidence of thrombus. Normal compressibility. Common Femoral Vein: No evidence of thrombus. Normal compressibility, respiratory phasicity and response to augmentation. Saphenofemoral Junction: No evidence of thrombus. Normal compressibility and flow on color Doppler imaging. Profunda Femoral Vein: No evidence of thrombus. Normal compressibility and flow on color Doppler imaging. Femoral Vein: No evidence of thrombus. Normal compressibility, respiratory phasicity and response to augmentation. Popliteal Vein: No evidence of thrombus. Normal compressibility, respiratory phasicity and response to augmentation. Calf Veins: No evidence of thrombus. Normal compressibility and flow on color Doppler imaging. Superficial Great Saphenous Vein: No evidence of thrombus. Normal compressibility. Venous Reflux:  None. Other Findings:  None. IMPRESSION: No evidence of deep venous  thrombosis. Electronically Signed   By: BJeannine BogaM.D.   On: 05/27/2021 19:41   DG Chest Port 1 View  Result Date: 06/07/2021 CLINICAL DATA:  Reason for exam: SOB Hx of CHF, COPD, asthma, aortic valve repair, history of bioprosthetic aortic valve replacement failure  with severe AS and resulting in TAVR on 04/20/2021, Rapid Response Nurse currently in room. EXAM: PORTABLE CHEST - 1 VIEW COMPARISON:  05/27/2021 FINDINGS: Interval progression of extensive bilateral asymmetric airspace opacities most confluent in the right mid and upper lung, and left lower lung, with enlarging focal opacity centrally in the left upper lung and worsening interstitial and airspace opacities at the right lung base. Heart size upper limits normal for technique. Previous AVR. CABG markers. Blunting of lateral costophrenic angles suggesting effusions. No pneumothorax. Sternotomy wires. IMPRESSION: 1. Worsening asymmetric extensive airspace infiltrates or edema. 2. Suspect small pleural effusions. Electronically Signed   By: Lucrezia Europe M.D.   On: 06/07/2021 07:51   DG Chest Port 1 View  Result Date: 05/19/2021 CLINICAL DATA:  Pt to ED via [facility] from home for shortness of breath. Pt speaking in complete sentences stating that she cannot breath. EXAM: PORTABLE CHEST 1 VIEW COMPARISON:  05/21/2021 FINDINGS: Stable changes from prior cardiac surgery and aortic valve replacement. Cardiac silhouette is enlarged. No mediastinal or hilar masses. Bilateral interstitial and patchy hazy airspace lung opacities. Interstitial opacities are similar to the prior exam, airspace opacities appear new. No convincing pleural effusion.  No pneumothorax. Skeletal structures are grossly intact. IMPRESSION: 1. Interstitial and hazy airspace lung opacities. Congestive heart failure with pulmonary edema suspected. Multifocal pneumonia is also in the differential diagnosis. Electronically Signed   By: Lajean Manes M.D.   On: 05/07/2021 18:42   DG  Chest Portable 1 View  Result Date: 05/21/2021 CLINICAL DATA:  Shortness of breath and heart racing. EXAM: PORTABLE CHEST 1 VIEW COMPARISON:  April 30, 2021 FINDINGS: Multiple sternal wires are noted. Stable, diffuse, chronic appearing increased interstitial lung markings are seen. There is no evidence of focal consolidation, pleural effusion or pneumothorax. The cardiac silhouette is enlarged and unchanged in size. An artificial aortic valve is seen. The visualized skeletal structures are unremarkable. IMPRESSION: Stable cardiomegaly and evidence of prior median sternotomy/CABG, without acute cardiopulmonary disease. Electronically Signed   By: Virgina Norfolk M.D.   On: 05/21/2021 02:40   DG Knee Complete 4 Views Left  Result Date: 05/27/2021 CLINICAL DATA:  Pain, swelling, bruising.  Fall. EXAM: LEFT KNEE - COMPLETE 4+ VIEW COMPARISON:  None. FINDINGS: Anterior soft tissue swelling. No acute bony abnormality. Specifically, no fracture, subluxation, or dislocation. No joint effusion. IMPRESSION: Anterior soft tissue swelling.  No acute bony abnormality. Electronically Signed   By: Rolm Baptise M.D.   On: 05/27/2021 05:28   ECHOCARDIOGRAM COMPLETE  Result Date: 06/04/2021    ECHOCARDIOGRAM REPORT   Patient Name:   JUSTICE AGUIRRE Date of Exam: 06/04/2021 Medical Rec #:  [id number]     Height:       66.0 in Accession #:    [id number]    Weight:       208.8 lb Date of Birth:  March 17, 1946      BSA:          2.037 m Patient Age:    72 years      BP:           151/77 mmHg Patient Gender: F             HR:           86 bpm. Exam Location:  [facility] Procedure: 2D Echo and Intracardiac Opacification Agent Indications:     Dyspnea R06.00  History:         Patient has prior history of Echocardiogram examinations, most  recent 05/17/2021.                  Aortic Valve: bioprosthetic valve is present in the aortic                  position.  Sonographer:     Kathlen Brunswick RDCS Referring Phys:  [id number]  AMY N COX Diagnosing Phys: Kate Sable MD  Sonographer Comments: Technically challenging study due to limited acoustic windows, Technically difficult study due to poor echo windows and no apical window. Image acquisition challenging due to patient body habitus. IMPRESSIONS  1. Left ventricular ejection fraction, by estimation, is 55 to 60%. The left ventricle has normal function. Left ventricular endocardial border not optimally defined to evaluate regional wall motion. There is moderate left ventricular hypertrophy. Left ventricular diastolic parameters are consistent with Grade II diastolic dysfunction (pseudonormalization).  2. Right ventricular systolic function is normal. The right ventricular size is not well visualized.  3. Left atrial size was mildly dilated.  4. A small pericardial effusion is present. The pericardial effusion is posterior and lateral to the left ventricle. There is no evidence of cardiac tamponade.  5. The mitral valve is degenerative. Trivial mitral valve regurgitation. Mild to moderate mitral stenosis. The mean mitral valve gradient is 8.0 mmHg.  6. The aortic valve has been repaired/replaced. Aortic valve regurgitation is not visualized. There is a bioprosthetic valve present in the aortic position.  7. Aortic dilatation noted. There is mild dilatation of the ascending aorta, measuring 40 mm. FINDINGS  Left Ventricle: Left ventricular ejection fraction, by estimation, is 55 to 60%. The left ventricle has normal function. Left ventricular endocardial border not optimally defined to evaluate regional wall motion. Definity contrast agent was given IV to delineate the left ventricular endocardial borders. The left ventricular internal cavity size was normal in size. There is moderate left ventricular hypertrophy. Left ventricular diastolic parameters are consistent with Grade II diastolic dysfunction (pseudonormalization). Right Ventricle: The right ventricular size is not well  visualized. No increase in right ventricular wall thickness. Right ventricular systolic function is normal. Left Atrium: Left atrial size was mildly dilated. Right Atrium: Right atrial size was not well visualized. Pericardium: A small pericardial effusion is present. The pericardial effusion is posterior and lateral to the left ventricle. There is no evidence of cardiac tamponade. Mitral Valve: The mitral valve is degenerative in appearance. Mild to moderate mitral annular calcification. Trivial mitral valve regurgitation. Mild to moderate mitral valve stenosis. MV peak gradient, 14.3 mmHg. The mean mitral valve gradient is 8.0 mmHg. Tricuspid Valve: The tricuspid valve is normal in structure. Tricuspid valve regurgitation is mild. Aortic Valve: The aortic valve has been repaired/replaced. Aortic valve regurgitation is not visualized. Aortic valve mean gradient measures 2.0 mmHg. Aortic valve peak gradient measures 4.1 mmHg. Aortic valve area, by VTI measures 4.24 cm. There is a bioprosthetic valve present in the aortic position. Pulmonic Valve: The pulmonic valve was normal in structure. Pulmonic valve regurgitation is mild. Aorta: Aortic dilatation noted. There is mild dilatation of the ascending aorta, measuring 40 mm. Venous: The inferior vena cava was not well visualized. IAS/Shunts: No atrial level shunt detected by color flow Doppler.  LEFT VENTRICLE PLAX 2D LVIDd:         4.55 cm      Diastology LVIDs:         3.15 cm      LV e' medial:    5.11 cm/s LV PW:  1.65 cm      LV E/e' medial:  31.3 LV IVS:        1.75 cm      LV e' lateral:   7.51 cm/s LVOT diam:     1.70 cm      LV E/e' lateral: 21.3 LV SV:         54 LV SV Index:   27 LVOT Area:     2.27 cm  LV Volumes (MOD) LV vol d, MOD A2C: 84.3 ml LV vol d, MOD A4C: 123.0 ml LV vol s, MOD A2C: 24.4 ml LV vol s, MOD A4C: 31.7 ml LV SV MOD A2C:     59.9 ml LV SV MOD A4C:     123.0 ml LV SV MOD BP:      79.1 ml LEFT ATRIUM           Index LA diam:       4.70 cm 2.31 cm/m LA Vol (A4C): 74.9 ml 36.77 ml/m  AORTIC VALVE                    PULMONIC VALVE AV Area (Vmax):    2.87 cm     PV Vmax:          1.32 m/s AV Area (Vmean):   2.86 cm     PV Peak grad:     7.0 mmHg AV Area (VTI):     4.24 cm     PR End Diast Vel: 6.66 msec AV Vmax:           101.35 cm/s AV Vmean:          64.200 cm/s AV VTI:            0.128 m AV Peak Grad:      4.1 mmHg AV Mean Grad:      2.0 mmHg LVOT Vmax:         128.00 cm/s LVOT Vmean:        80.900 cm/s LVOT VTI:          0.239 m LVOT/AV VTI ratio: 1.87  AORTA Ao Root diam: 2.90 cm Ao Asc diam:  4.00 cm MITRAL VALVE                TRICUSPID VALVE MV Area (PHT): 3.00 cm     TV Peak grad:   27.6 mmHg MV Area VTI:   1.02 cm     TV Vmax:        2.62 m/s MV Peak grad:  14.3 mmHg MV Mean grad:  8.0 mmHg     SHUNTS MV Vmax:       1.89 m/s     Systemic VTI:  0.24 m MV Vmean:      136.0 cm/s   Systemic Diam: 1.70 cm MV Decel Time: 253 msec MV E velocity: 160.00 cm/s MV A velocity: 173.00 cm/s MV E/A ratio:  0.92 Kate Sable MD Electronically signed by Kate Sable MD Signature Date/Time: 06/04/2021/3:30:30 PM    Final    ECHOCARDIOGRAM COMPLETE  Result Date: 05/17/2021    ECHOCARDIOGRAM REPORT   Patient Name:   TAIMI TOWE Date of Exam: 05/17/2021 Medical Rec #:  [id number]     Height:       65.0 in Accession #:    [id number]    Weight:       215.0 lb Date of Birth:  1946/01/01      BSA:  2.040 m Patient Age:    85 years      BP:           130/58 mmHg Patient Gender: F             HR:           85 bpm. Exam Location:  [address] Procedure: 2D Echo, Cardiac Doppler and Color Doppler Indications:    Z95.2 Status post TAVR  History:        Patient has prior history of Echocardiogram examinations, most                 recent 04/19/2021. CAD, Status post TAVR-23 mm [place] S3U,                 COPD; Risk Factors:Sleep Apnea, Dyslipidemia, Diabetes and                 Hypertension.  Sonographer:    Cresenciano Lick RDCS  Referring Phys: [phone] JILL D MCDANIEL IMPRESSIONS  1. Left ventricular ejection fraction, by estimation, is 55 to 60%. The left ventricle has normal function. The left ventricle has no regional wall motion abnormalities. There is moderate left ventricular hypertrophy. Left ventricular diastolic parameters are consistent with Grade I diastolic dysfunction (impaired relaxation).  2. Right ventricular systolic function is normal. The right ventricular size is normal. There is normal pulmonary artery systolic pressure. The estimated right ventricular systolic pressure is 10.6 mmHg.  3. Left atrial size was mildly dilated.  4. Right atrial size was mildly dilated.  5. The mitral valve is degenerative. No evidence of mitral valve regurgitation. Moderate to severe mitral stenosis. The mean mitral valve gradient is 9.0 mmHg with MVA by VTI 1.12 cm^2. Moderate mitral annular calcification with extensive valvular calcification.  6. Bioprosthetic aortic valve s/p TAVR. 23 mm [place] Sapien THV. No perivalvular leakage noted. Mean gradient 33 mmHg is significantly elevated, dimensionless index 0.31. Would suggest TEE or gated CTA to more closely evaluate bioprosthetic valve stenosis.  7. Aortic dilatation noted. There is mild dilatation of the ascending aorta, measuring 40 mm.  8. The inferior vena cava is normal in size with greater than 50% respiratory variability, suggesting right atrial pressure of 3 mmHg. FINDINGS  Left Ventricle: Left ventricular ejection fraction, by estimation, is 55 to 60%. The left ventricle has normal function. The left ventricle has no regional wall motion abnormalities. The left ventricular internal cavity size was normal in size. There is  moderate left ventricular hypertrophy. Left ventricular diastolic parameters are consistent with Grade I diastolic dysfunction (impaired relaxation). Right Ventricle: The right ventricular size is normal. No increase in right ventricular wall thickness. Right  ventricular systolic function is normal. There is normal pulmonary artery systolic pressure. The tricuspid regurgitant velocity is 2.55 m/s, and  with an assumed right atrial pressure of 3 mmHg, the estimated right ventricular systolic pressure is 26.9 mmHg. Left Atrium: Left atrial size was mildly dilated. Right Atrium: Right atrial size was mildly dilated. Pericardium: Trivial pericardial effusion is present. Mitral Valve: The mitral valve is degenerative in appearance. There is severe calcification of the mitral valve leaflet(s). Moderate mitral annular calcification. No evidence of mitral valve regurgitation. Moderate to severe mitral valve stenosis. MV peak gradient, 14.3 mmHg. The mean mitral valve gradient is 9.0 mmHg. Tricuspid Valve: The tricuspid valve is normal in structure. Tricuspid valve regurgitation is trivial. Aortic Valve: Bioprosthetic aortic valve s/p TAVR. 23 mm Tremaine Sapien THV. No perivalvular leakage noted. Mean gradient  33 mmHg is significantly elevated, dimensionless index 0.31. Would suggest TEE or gated CTA to more closely evaluate bioprosthetic valve stenosis. The aortic valve has been repaired/replaced. Aortic valve regurgitation is not visualized. Aortic valve mean gradient measures 33.0 mmHg. Aortic valve peak gradient measures 34.5 mmHg. Aortic valve area, by VTI measures 1.02 cm. Pulmonic Valve: The pulmonic valve was normal in structure. Pulmonic valve regurgitation is not visualized. Aorta: Aortic dilatation noted. There is mild dilatation of the ascending aorta, measuring 40 mm. Venous: The inferior vena cava is normal in size with greater than 50% respiratory variability, suggesting right atrial pressure of 3 mmHg. IAS/Shunts: No atrial level shunt detected by color flow Doppler.  LEFT VENTRICLE PLAX 2D LVIDd:         4.60 cm   Diastology LVIDs:         3.00 cm   LV e' medial:    6.09 cm/s LV PW:         1.20 cm   LV E/e' medial:  25.9 LV IVS:        1.20 cm   LV e' lateral:    7.62 cm/s LVOT diam:     1.90 cm   LV E/e' lateral: 20.7 LV SV:         57 LV SV Index:   28 LVOT Area:     2.84 cm  RIGHT VENTRICLE RV Basal diam:  4.00 cm RV S prime:     12.70 cm/s TAPSE (M-mode): 2.2 cm LEFT ATRIUM             Index        RIGHT ATRIUM           Index LA diam:        5.10 cm 2.50 cm/m   RA Area:     19.50 cm LA Vol (A2C):   86.4 ml 42.36 ml/m  RA Volume:   62.50 ml  30.64 ml/m LA Vol (A4C):   54.3 ml 26.62 ml/m LA Biplane Vol: 72.6 ml 35.59 ml/m  AORTIC VALVE AV Area (Vmax):    1.02 cm AV Area (Vmean):   0.95 cm AV Area (VTI):     1.02 cm AV Vmax:           293.80 cm/s AV Vmean:          218.200 cm/s AV VTI:            0.553 m AV Peak Grad:      34.5 mmHg AV Mean Grad:      33.0 mmHg LVOT Vmax:         105.50 cm/s LVOT Vmean:        73.000 cm/s LVOT VTI:          0.200 m LVOT/AV VTI ratio: 0.36  AORTA Ao Root diam: 3.50 cm Ao Asc diam:  4.00 cm MITRAL VALVE                TRICUSPID VALVE MV Area (PHT): 3.34 cm     TR Peak grad:   26.0 mmHg MV Area VTI:   1.12 cm     TR Vmax:        255.00 cm/s MV Peak grad:  14.3 mmHg MV Mean grad:  9.0 mmHg     SHUNTS MV Vmax:       1.89 m/s     Systemic VTI:  0.20 m MV Vmean:      145.5 cm/s   Systemic Diam: 1.90  cm MV VTI:        0.50 m MV Decel Time: 227 msec MV E velocity: 157.50 cm/s MV A velocity: 177.00 cm/s MV E/A ratio:  0.89 Dalton McleanMD Electronically signed by Franki Monte Signature Date/Time: 05/17/2021/6:15:59 PM    Final       ASSESSMENT/PLAN   Acute on chronic hypoxemic respiratory faiulre  -patient has severe multi vessel CAD based on cardiac cath few months ago  - acutely there is evidence of pulmonary edema on CT chest.  -she had episode of respiratory distress and I spoke to RN who takes care of her who explained this was panic attack and patient admits to severe anxiety. She responded well to precedex and confirts DNR/DNI status -patient does have moderate pulmonary hypertension on RHC -CTPE is negative for  PE -will consider sildenafil TID for pulmonary hypertension once more diruresed -vital were rechecked during my examination and were stable  Moderate pulmonary hypertension   - sildenafil 20 TID post diuresis  Acute pulmonary edema   -recheck TTE   Acute exacerbation of COPD with chronic hypoxemia   - continue iv solumedrol 20 IV daily   GI/DVT ppx  Electrolyte derrangements - pharmacy consult   Critical care provider statement:   Total critical care time: 33 minutes   Performed by: Lanney Gins MD   Critical care time was exclusive of separately billable procedures and treating other patients.   Critical care was necessary to treat or prevent imminent or life-threatening deterioration.   Critical care was time spent personally by me on the following activities: development of treatment plan with patient and/or surrogate as well as nursing, discussions with consultants, evaluation of patient's response to treatment, examination of patient, obtaining history from patient or surrogate, ordering and performing treatments and interventions, ordering and review of laboratory studies, ordering and review of radiographic studies, pulse oximetry and re-evaluation of patient's condition.    Ottie Glazier, M.D.  Pulmonary & Critical Care Medicine       Ottie Glazier, M.D.  Division of [place]

## 2021-06-08 NOTE — Evaluation (Signed)
Clinical/Bedside Swallow Evaluation Patient Details  Name: [name] MRN: [id number] Date of Birth: Aug 04, 1945  Today's Date: 06/08/2021 Time: SLP Start Time (ACUTE ONLY): 1120 SLP Stop Time (ACUTE ONLY): 1142 SLP Time Calculation (min) (ACUTE ONLY): 22 min  Past Medical History:  Past Medical History:  Diagnosis Date   1st degree AV block    ACE-inhibitor cough    Allergic rhinitis    Anemia    iron deficiency anemia   Anxiety    Aortic ectasia (HCC)    a. CT abd in 12/2016 incidentally noted aortic atherosclerosis and infrarenal abdominal aortic ectasia measuring as large as 2.7 cm with recommendation to repeat US in 2023.   Aortic stenosis    a. 10/2013 s/p AVR; b. 04/2021 s/p TAVR.   Arthritis    Asthma    Cataract    Chronic depression    Chronic diastolic CHF (congestive heart failure) (HCC)    Chronic headache    CKD (chronic kidney disease), stage III (HCC)    COPD (chronic obstructive pulmonary disease) (HCC)    Coronary artery disease    a. DES to RCA and mid Cx 2009. b. CABG x 2 and bioprosthetic AVR May 2015. c. cutting balloon to prox Cx in 05/2016; d. 11/2020 PCI/DES to LCX and VG->RCA (prox and distal); e. 02/2021 Cath: LM nl, LAD 171m CTO, D1 min irregs, LCX 50p/m ISR, patent mid stent, RCA 20ost, 80p, 112m, RPDA nl, VG->dRCA patent p/d stents, LIMA->LAD patent.   Dementia ([place])    Diverticulitis of colon    Essential hypertension    GERD (gastroesophageal reflux disease)    Hearing loss    History of blood transfusion 2013   History of prosthetic aortic valve replacement    a. 10/2013 s/p bioprosthetic AVR; b. 04/2021 s/p TAVR w/ Oletta Lamas Sapien 3 THV (43mm); c. 05/2021 Echo: EF 55-60%, no PV leak, 42mmHg mean gradient; d. 06/2021 Echo: EF 55-60%, mod LVH. GrII DD. Nl RV size/fxn. Mildly dil LA. Small peric eff w/o tamponade. Triv MR. Mild-mod MS (mean grad 46mmHg), Nl fxn'ing AoV - peak grad 4.11mmHg, mean grad 2.47mmHg. AVA 4.24cm^2 (VTI).   HOH (hard of hearing)     Hypercholesterolemia    a. intolerance of statins and niaspan -   IDA (iron deficiency anemia) 02/03/2019   Mitral stenosis    a. 06/2021 Echo: Mild-mod MS (mean gradient 85mmHg.   Mobitz type 1 second degree AV block    OSA (obstructive sleep apnea)    mild, intolerant of cpap   PAD (peripheral artery disease) ([place])    a. atherosclerosis by CT abd 12/2016 in [place].   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 Sumner Boast & Dr. Cyndia Bent; b. 05/2021 Echo: EF 55-60%, no PV leak, 50mmHg mean gradient; c. 06/2021 Echo: EF 55-60%, mod LVH. GrII DD. Nl fxn'ing AoV - peak grad 4.3mmHg, mean grad 2.16mmHg. AVA 4.24cm^2 (VTI).   Statin intolerance    Thyroid disease    Type II diabetes mellitus ([place])    Past Surgical History:  Past Surgical History:  Procedure Laterality Date   ABDOMINAL HYSTERECTOMY     ABDOMINAL HYSTERECTOMY W/ PARTIAL VAGINACTOMY     AORTIC VALVE REPLACEMENT N/A 10/12/2013   Procedure: AORTIC VALVE REPLACEMENT (AVR);  Surgeon: [name], MD;  Location: [facility];  Service: Open Heart Surgery;  Laterality: N/A;   APPENDECTOMY  1964   BARTHOLIN GLAND CYST EXCISION     BLADDER SUSPENSION     BREAST BIOPSY Bilateral 09/11/2000   neg   BREAST BIOPSY Left 07/24/2010   neg   BREAST CYST EXCISION  1988   bilateral nonmalignant tumors, x3   CARDIAC CATHETERIZATION     CARDIAC CATHETERIZATION N/A 05/25/2016   Procedure: Coronary Balloon Angioplasty;  Surgeon: Leonie Man, MD;  Location: [place] [facility];  Service: Cardiovascular;  Laterality: N/A;   CARDIAC CATHETERIZATION N/A 05/25/2016   Procedure: Coronary/Graft Angiography;  Surgeon: Leonie Man, MD;  Location: [place] [facility];  Service: Cardiovascular;  Laterality: N/A;   CATARACT EXTRACTION W/ INTRAOCULAR LENS  IMPLANT, BILATERAL     CHOLECYSTECTOMY  2001   COLECTOMY     lap  sigmoid   COLONOSCOPY  2014   polyps found, 2 clamped off.   CORONARY ANGIOPLASTY  10/29/2007   Prox RCA & Mid Cx.   CORONARY ARTERY BYPASS GRAFT N/A 10/12/2013   Procedure: CORONARY ARTERY BYPASS GRAFT TIMES TWO;  Surgeon: [name], MD;  Location: [facility];  Service: Open Heart Surgery;  Laterality: N/A;   CORONARY/GRAFT ANGIOGRAPHY N/A 09/20/2017   Procedure: CORONARY/GRAFT ANGIOGRAPHY;  Surgeon: Sherren Mocha, MD;  Location: [place] [facility];  Service: Cardiovascular;  Laterality: N/A;   INTRAOPERATIVE TRANSTHORACIC ECHOCARDIOGRAM N/A 04/18/2021   Procedure: INTRAOPERATIVE TRANSTHORACIC ECHOCARDIOGRAM;  Surgeon: [name], MD;  Location: [place] [facility];  Service: Open Heart Surgery;  Laterality: N/A;   LEFT HEART CATHETERIZATION WITH CORONARY ANGIOGRAM N/A 10/09/2013   Procedure: LEFT HEART CATHETERIZATION WITH CORONARY ANGIOGRAM;  Surgeon: [name], MD;  Location: [facility];  Service: Cardiovascular;  Laterality: N/A;   RIGHT/LEFT HEART CATH AND CORONARY ANGIOGRAPHY N/A 11/03/2020   Procedure: RIGHT/LEFT HEART CATH AND CORONARY ANGIOGRAPHY;  Surgeon: Corey Skains, MD;  Location: [place] [facility];  Service: Cardiovascular;  Laterality: N/A;   RIGHT/LEFT HEART CATH AND CORONARY/GRAFT ANGIOGRAPHY N/A 02/10/2021   Procedure: RIGHT/LEFT HEART CATH AND CORONARY/GRAFT ANGIOGRAPHY;  Surgeon: Andrez Grime, MD;  Location: [place] [facility];  Service: Cardiovascular;  Laterality: N/A;   STERNAL WIRES REMOVAL N/A 04/13/2014   Procedure: STERNAL WIRES REMOVAL;  Surgeon: [name], MD;  Location: [place];  Service: Thoracic;  Laterality: N/A;   TEE WITHOUT CARDIOVERSION N/A 11/03/2020   Procedure: TRANSESOPHAGEAL ECHOCARDIOGRAM (TEE);  Surgeon: Corey Skains, MD;  Location: [facility];  Service: Cardiovascular;  Laterality: N/A;   TEE WITHOUT CARDIOVERSION N/A 02/13/2021   Procedure: TRANSESOPHAGEAL ECHOCARDIOGRAM (TEE);  Surgeon: Corey Skains,  MD;  Location: [facility];  Service: Cardiovascular;  Laterality: N/A;   THYROIDECTOMY     TONSILLECTOMY     TRANSCATHETER AORTIC VALVE REPLACEMENT, TRANSFEMORAL N/A 04/18/2021   Procedure: TRANSCATHETER AORTIC VALVE REPLACEMENT, TRANSFEMORAL;  Surgeon: [name], MD;  Location: [place] [facility];  Service: Open Heart Surgery;  Laterality: N/A;   TUBAL LIGATION     VAGINAL DELIVERY     3   VISCERAL ARTERY INTERVENTION N/A 08/16/2016   Procedure: Visceral Artery Intervention;  Surgeon: Algernon Huxley, MD;  Location: [place] [facility];  Service: Cardiovascular;  Laterality: N/A;   HPI:  TAYLI BUCH is a 76 y.o. female who presents from home with medical history significant for aortic dilatation, history of aortic valve repair, history of bioprosthetic aortic valve replacement with severe AS and resulting in TAVR on 04/20/2021, COPD  on room air at baseline, former tobacco user, chronic anxiety/depression with frequent panic attacks, hypertension, obstructive sleep apnea, iron deficiency anemia, CAD status post CABG in 2015 (LIMA to LAD, SVG to RCA, and 21 mm [employer]) and status post PCI in June 2022, anemia of chronic disease who presented [facility] ED on 05/14/2021 with worsening malaise and global weakness.  Associated with dyspnea on exertion and at rest.  Work-up revealed acute hypoxic respiratory failure secondary to community-acquired pneumonia and COPD exacerbation.  CTA was negative for PE.  She was started on IV antibiotics empirically, IV steroids, pulmonary toilet, and bronchodilators. Hospital course complicated by respiratory distress on 06/07/2021 for which she was transferred to stepdown unit and was placed on BiPAP continuously.  Due to restlessness and agitation delirium, she was started on Precedex drip.  Also received vasopressor to maintain her MAP greater than 65, this was held on 06/08/2021. Recent chest x-ray (06/07/2021) revealed Interval progression of extensive  bilateral asymmetric airspace opacities most confluent in the right mid and upper lung, and left lower lung, with enlarging focal opacity centrally in the left upper lung and worsening interstitial and airspace opacities at the right lung base. Suspect small pleural effusions.    Assessment / Plan / Recommendation  Clinical Impression  Upon arriving to pt's room, she was SOB with increased WOB, RR 31 and had just transitioned to [place] from heated HFNC. Although pt appeared to be gasping for air, she was pleading for ice chips. I introduced myself and ST services to pt's husband and her son. Pt's husband describes swallowing difficulty prior to admission as "she is always hungry. She wakes me up at 2 or 3 o'clock in the morning wanting something to eat. Then she eats so fast that she chokes because she swallows things whole." Current condition and respiratory compromise discussed along with implications for safe swallowing. Pt's nurse also stated that pt was coughing when consuming some ice chips earlier in the day. Despite maximal multimodal cues, pt was not able to slow her breathing, she was unable to open her eyes, demonstrate attention to this writer or follow any directions. Pt's RR continued to be above parameters for PO intake. Given pt's acute respiratory decline, increased WOB, RR, AMS and report of dysphagia prior to admission along with difficulty observed with ice chips this morning, recommend STRICT NPO. Education was provided to pt's husband and her son on recommendation, specifically recommendation of NO ICE CHIPS. Pt's husband provides that pt "is used to getting whatever she wants." Education provided to husband and son that should they wish to provide pt with ice chips, Palliative Care can help with quality of life decisions. SLP Visit Diagnosis: Dysphagia, unspecified (R13.10)    Aspiration Risk  Severe aspiration risk;Risk for inadequate nutrition/hydration    Diet Recommendation NPO    Medication Administration: Via alternative means    Other  Recommendations Oral Care Recommendations: Oral care QID    Recommendations for follow up therapy are one component of a multi-disciplinary discharge planning process, led by the attending physician.  Recommendations may be updated based on patient status, additional functional criteria and insurance authorization.  Follow up Recommendations  (TBD)      Assistance Recommended at Discharge Frequent or constant Supervision/Assistance  Functional Status Assessment Patient has had a recent decline in their functional status and/or demonstrates limited ability to make significant improvements in function in a reasonable and predictable amount of time  Frequency and Duration min 2x/week  2  weeks       Prognosis Prognosis for Safe Diet Advancement: Fair Barriers to Reach Goals: Cognitive deficits;Motivation;Severity of deficits;Behavior      Swallow Study   General Date of Onset: 06/08/21 HPI: DELMY HOLDREN is a 76 y.o. female who presents from home with medical history significant for aortic dilatation, history of aortic valve repair, history of bioprosthetic aortic valve replacement with severe AS and resulting in TAVR on 04/20/2021, COPD on room air at baseline, former tobacco user, chronic anxiety/depression with frequent panic attacks, hypertension, obstructive sleep apnea, iron deficiency anemia, CAD status post CABG in 2015 (LIMA to LAD, SVG to RCA, and 21 mm [employer]) and status post PCI in June 2022, anemia of chronic disease who presented to [facility] ED on 05/17/2021 with worsening malaise and global weakness.  Associated with dyspnea on exertion and at rest.  Work-up revealed acute hypoxic respiratory failure secondary to community-acquired pneumonia and COPD exacerbation.  CTA was negative for PE.  She was started on IV antibiotics empirically, IV steroids, pulmonary toilet, and bronchodilators. Hospital course  complicated by respiratory distress on 06/07/2021 for which she was transferred to stepdown unit and was placed on BiPAP continuously.  Due to restlessness and agitation delirium, she was started on Precedex drip.  Also received vasopressor to maintain her MAP greater than 65, this was held on 06/08/2021. Recent chest x-ray (06/07/2021) revealed Interval progression of extensive bilateral asymmetric airspace opacities most confluent in the right mid and upper lung, and left lower lung, with enlarging focal opacity centrally in the left upper lung and worsening interstitial and airspace opacities at the right lung base. Suspect small pleural effusions. Type of Study: Bedside Swallow Evaluation Previous Swallow Assessment: none in chart Diet Prior to this Study: NPO Temperature Spikes Noted: No Respiratory Status: Nasal cannula History of Recent Intubation: No Behavior/Cognition: Doesn't follow directions;Confused;Distractible Oral Cavity Assessment: Dry Oral Care Completed by SLP: Recent completion by staff Patient Positioning: Upright in bed Baseline Vocal Quality: Breathy;Low vocal intensity Volitional Cough: Cognitively unable to elicit Volitional Swallow: Unable to elicit    Oral/Motor/Sensory Function Overall Oral Motor/Sensory Function:  (unable to assess d/t AMS)   Ice Chips Ice chips: Not tested      Lamir Racca B. Rutherford Nail M.S., CCC-SLP, [place] Pathologist Rehabilitation Services Office [place] 06/08/2021,12:39 PM

## 2021-06-08 NOTE — Progress Notes (Addendum)
Palliative Care Progress Note, Assessment & Plan   Patient Name: [name]       Date: 06/08/2021 DOB: 11-Sep-1945  Age: 76 y.o. MRN#: [id number] Attending Physician: Kayleen Memos, DO Primary Care Physician: Rusty Aus, MD Admit Date: 05/25/2021  Reason for Consultation/Follow-up: Establishing goals of care  Subjective: Patient is sitting in bed, appears asleep, and in no apparent distress.  High flow nasal cannula is in place.  Patient continues to have use of accessory muscles for breathing.  No family at bedside.  HPI: 76 y.o. 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  Patient found to have bilateral diffuse pulmonary infiltrates.   On 1/4 in AM patient became anxious and had difficulty breathing.  Patient currently receiving IV Ativan and using BiPAP continuously with increased agitation and anxiety.  On 1/5 peech and language therapy attempted bedside swallow study and recommended strict NPO diet.   Summary of counseling/coordination of care: After reviewing the patient's chart, I assessed the patient at bedside.  No family is at bedside and patient is resting comfortably.  I consulted with patient's nurse.  Nurse has concerns over patient needing to go to CT for scan of head and chest.  While patient appears calm at the moment, nurse is  concerned that any touch or movement awakens the patient and sets a cycle of agitation and anxiety.  Patient is able to hold saturations well with heated high flow nasal cannula when calm.  However, patient becomes increasingly agitated and saturations dropped quickly due to anxiety.  Discussed with nurse that fentanyl is being effective in helping control patient's agitation and anxiety.  However, overnight patient was given 25 mcg as one-time doses.  Nurse conveys this was a more effective dosage.  One-time dose of fentanyl 12.5 mcg added so patient can have additional dosage for movement down to CT scan.  Moving forward, patient will be given fentanyl 25 mcg every 2 hours as needed.  Nursing notified that Ativan as needed is still available should fentanyl not be adequate in managing patient's pain, agitation, and anxiety.  Palliative medicine team will continue to follow the patient throughout her hospitalization.  Code Status: DNR  Prognosis: Unable to determine  Discharge Planning: To Be Determined  Recommendations/Plan: Fentanyl 25 mcg every 2 hours as needed Ativan is still available as needed if fentanyl is not effective  Care plan was discussed with nursing  Physical Exam Vitals and nursing note reviewed.  Constitutional:      General: She is in acute distress.     Appearance: She is ill-appearing. She is not toxic-appearing.  HENT:     Head: Normocephalic and atraumatic.  Cardiovascular:     Rate and Rhythm: Normal rate.  Pulmonary:     Effort: Tachypnea present.  Abdominal:  Palpations: Abdomen is soft.  Musculoskeletal:     Comments: Generalized weakness  Skin:    General: Skin is warm and dry.            Palliative Assessment/Data: 30%    Total Time 25 minutes  Greater than 50%  of this time was spent counseling and coordinating care related to the above assessment and plan.  Thank you for allowing the Palliative Medicine Team to assist in the care  of this patient.  Clinton Ilsa Iha, FNP-BC Palliative Medicine Team Team Phone # [phone]

## 2021-06-08 NOTE — Progress Notes (Addendum)
PROGRESS NOTE    [name]  INO:[id number] DOB: [birth year] DOA: 05/24/2021 PCP: Rusty Aus, MD   Brief Narrative:  [name] is a 76 y.o. female who presents from home with medical history significant for aortic dilatation, history of aortic valve repair, history of bioprosthetic aortic valve replacement with severe AS and resulting in TAVR on 04/20/2021, COPD on room air at baseline, former tobacco user, chronic anxiety/depression with frequent panic attacks, hypertension, obstructive sleep apnea, iron deficiency anemia, CAD status post CABG in 2015 (LIMA to LAD, SVG to RCA, and 21 mm [employer]) and status post PCI in June 2022, anemia of chronic disease who presented to [facility] ED on 05/22/2021 with worsening malaise and global weakness.  Associated with dyspnea on exertion and at rest.  Work-up revealed acute hypoxic respiratory failure secondary to community-acquired pneumonia and COPD exacerbation.  CTA was negative for PE.  She was started on IV antibiotics empirically, IV steroids, pulmonary toilet, and bronchodilators.  Hospital course complicated by respiratory distress on 06/07/2021 for which she was transferred to stepdown unit and was placed on BiPAP continuously.  Due to restlessness and agitation delirium, she was started on Precedex drip.  Also received vasopressor to maintain her MAP greater than 65, this was held on 06/08/2021  06/08/2021: Patient was seen and examined at her bedside while in the stepdown unit.  She is calmer on Precedex.  Transient bradycardia reported by bedside RN.   Assessment & Plan:   Acute hypoxic respiratory failure, multifactorial, POA Secondary to multifocal pneumonia, COPD exacerbation  -Seen by cardiology given recent TAVR  -Echocardiogram - appropriate TAVR and LV function.  -Chest x-ray shows diffuse bilateral infiltrate concerning for viral etiology although COVID and flu remain negative.  IV antibiotics was broadened on 06/07/2021 due  to respiratory decompensation.  Meropenem and linezolid were ordered.  These were switched to IV Zosyn.  Then this was switched to IV doxycycline on 06/08/2021.   RSV negative.   BNP elevated greater than 600  CTA negative for PE.  Minimal saturation greater than 90%. BiPAP nightly Heated high flow nasal cannula during the day to allow for breaks from BiPAP and to allow for feedings. Continue to monitor in stepdown unit  Agitation delirium on Precedex Did not respond to benzodiazepine Precedex was started on 06/07/2021 PCCM assisting with management of her agitation delirium while on Precedex drip.  Transient bradycardia in the setting of Precedex infusion Suspect secondary to Precedex Continue to closely monitor in stepdown unit Obtain twelve-lead EKG and reconsult cardiology PCCM also following Hold off AV nodal blockade agents  QTC prolongation Avoid QTC prolonging agents Optimize magnesium and potassium levels Repeat twelve-lead EKG  AKI on CKD 3B Baseline creatinine appears to be 1.4 with GFR of 36 Creatinine is uprising 1.72 with GFR of 30. Avoid nephrotoxic agents, and hypotension. Monitor urine output closely  Hyperphosphatemia in the setting of acute renal insufficiency Phosphorus 6.1, if no improvement may consider phosphorus binder PhosLo Repeat renal panel in the morning  Dysphagia Seen by speech therapist, recommended strict n.p.o. Continue n.p.o. Continue aspiration precaution  Acute diastolic CHF Elevated BNP on presentation greater than 300 Increased pulmonary vascularity seen on chest x-ray, personally reviewed. Repeat BMP on 06/07/2021 Received doses of IV Lasix due to acute respiratory distress. Strict I's and O's and daily weight Replete electrolytes as indicated Closely monitor on stepdown unit.  Ambulatory dysfunction, frequent falls, left knee ecchymosis, POA -PT OT to follow once medically stable for  ambulation -Left knee imaging unremarkable for  fracture  Acute symptomatic anemia, ongoing/anemia of chronic disease -Baseline iron deficiency anemia with chronic anemia of chronic disease  FOBT negative -1 unit PRBC transfused on 06/06/2021, repeat hemoglobin 7.8. No overt bleeding.  Insulin-dependent diabetes mellitus type 2 -Continue sliding scale insulin, hypoglycemic protocol  -Home insulin 100 units at bedtime, will continue as long as patient's appetite remains appropriate Hemoglobin A1c 6.2 on 04/14/2021. Avoid hypoglycemia  Depression, Generalized anxiety disorder, Anxiety - Resume home duloxetine 20 mg every morning, fluoxetine 40 mg twice daily; Judicious benzodiazepine in the setting of respiratory distress.  Insomnia-trazodone on hold due to drug drug interaction with linezolid. Hypothyroid-continue home levothyroxine 125 mcg daily GERD-continue PPI  Goals of care Palliative care team consulted to assist with establishing goals of care. DNR  Critical care time: 65 minutes   DVT prophylaxis: Lovenox subcu daily. Code Status: DNR Family Communication: No family members at bedside.  Status is: Inpatient  Dispo: The patient is from: Home              Anticipated d/c is to: To be determined -likely SNF given profound dyspnea with minimal exertion.              Anticipated d/c date is: 72+ hours              Patient currently not medically stable for discharge  Consultants:  Cardiology Palliative care team. PCCM  Procedures:  None planned  Antimicrobials:  Azithromycin, ceftriaxone x5 days, completed on 06/07/2021. Meropenem, linezolid started on 06/07/2021.  Objective: Vitals:   06/08/21 0934 06/08/21 0935 06/08/21 0936 06/08/21 0937  BP:      Pulse: (!) 56 60 63 60  Resp: (!) 34 (!) 25 (!) 27 (!) 25  Temp:      TempSrc:      SpO2: 99% 99% 99% 99%  Weight:      Height:        Intake/Output Summary (Last 24 hours) at 06/08/2021 1126 Last data filed at 06/08/2021 0600 Gross per 24 hour  Intake  897.65 ml  Output 1175 ml  Net -277.35 ml   Filed Weights   06/07/21 0500 06/07/21 0747 06/08/21 0500  Weight: 98.1 kg 97.6 kg 96.2 kg    Examination:  General: Obese in no acute distress.  She is somnolent but easily arousable to voices.  On heated high flow nasal cannula.   HEENT: Normocephalic/atraumatic. Neck: Trachea is midline. Lungs: Diffuse rales bilaterally.  Mild wheezing noted diffusely.  Poor inspiratory effort.   Heart: Regular rate and rhythm no rubs or gallops.  No JVD noted.   Abdomen: Soft nontender normal bowel sounds present.   Extremities: Trace lower extremity edema bilaterally.   Vascular: 12 4 pulses in all 4 extremities.   Skin: Skin is warm and dry. Psych: Mood is appropriate for condition and setting. Neuro: Moves all 4 extremities equally.  Nonfocal exam.   Data Reviewed: I have personally reviewed following labs and imaging studies  CBC: Recent Labs  Lab 05/24/2021 1808 06/04/21 0744 06/06/21 0550 06/07/21 0349 06/08/21 0607  WBC 14.6* 12.1* 14.3* 18.0* 13.1*  NEUTROABS  --   --   --   --  11.2*  HGB 7.5* 7.4* 6.9* 7.8* 7.2*  HCT 24.9* 25.0* 22.7* 24.4* 23.5*  MCV 82.5 81.7 80.5 80.5 82.5  PLT 213 216 194 174 976   Basic Metabolic Panel: Recent Labs  Lab 05/11/2021 1808 06/04/21 0744 06/06/21 0550 06/07/21 0349 06/08/21 7341  NA 134* 134* 130* 132* 138  K 3.7 4.3 4.2 4.6 4.5  CL 99 96* 96* 98 99  CO2 25 26 26 27 27   GLUCOSE 120* 195* 164* 250* 98  BUN 28* 29* 32* 32* 40*  CREATININE 1.39* 1.58* 1.58* 1.49* 1.72*  CALCIUM 7.8* 8.0* 7.7* 7.9* 8.1*  MG  --   --   --   --  2.3  PHOS  --   --   --   --  6.1*   GFR: Estimated Creatinine Clearance: 32.6 mL/min (A) (by C-G formula based on SCr of 1.72 mg/dL (H)). Liver Function Tests: Recent Labs  Lab 06/08/21 0607  AST 21  ALT 16  ALKPHOS 129*  BILITOT 0.9  PROT 6.2*  ALBUMIN 2.6*   No results for input(s): LIPASE, AMYLASE in the last 168 hours. No results for input(s):  AMMONIA in the last 168 hours. Coagulation Profile: No results for input(s): INR, PROTIME in the last 168 hours. Cardiac Enzymes: No results for input(s): CKTOTAL, CKMB, CKMBINDEX, TROPONINI in the last 168 hours. BNP (last 3 results) No results for input(s): PROBNP in the last 8760 hours. HbA1C: No results for input(s): HGBA1C in the last 72 hours. CBG: Recent Labs  Lab 06/07/21 0749 06/07/21 1233 06/07/21 1604 06/07/21 2130 06/08/21 0740  GLUCAP 209* 168* 156* 130* 89   Lipid Profile: No results for input(s): CHOL, HDL, LDLCALC, TRIG, CHOLHDL, LDLDIRECT in the last 72 hours. Thyroid Function Tests: No results for input(s): TSH, T4TOTAL, FREET4, T3FREE, THYROIDAB in the last 72 hours. Anemia Panel: No results for input(s): VITAMINB12, FOLATE, FERRITIN, TIBC, IRON, RETICCTPCT in the last 72 hours. Sepsis Labs: Recent Labs  Lab 05/09/2021 1808 06/07/21 0843 06/08/21 0607  PROCALCITON 0.17 1.09 2.14  LATICACIDVEN  --  1.6 0.8    Recent Results (from the past 240 hour(s))  Resp Panel by RT-PCR (Flu A&B, Covid) Nasopharyngeal Swab     Status: None   Collection Time: 06/04/21  8:32 AM   Specimen: Nasopharyngeal Swab; Nasopharyngeal(NP) swabs in vial transport medium  Result Value Ref Range Status   SARS Coronavirus 2 by RT PCR NEGATIVE NEGATIVE Final    Comment: (NOTE) SARS-CoV-2 target nucleic acids are NOT DETECTED.  The SARS-CoV-2 RNA is generally detectable in upper respiratory specimens during the acute phase of infection. The lowest concentration of SARS-CoV-2 viral copies this assay can detect is 138 copies/mL. A negative result does not preclude SARS-Cov-2 infection and should not be used as the sole basis for treatment or other patient management decisions. A negative result may occur with  improper specimen collection/handling, submission of specimen other than nasopharyngeal swab, presence of viral mutation(s) within the areas targeted by this assay, and  inadequate number of viral copies(<138 copies/mL). A negative result must be combined with clinical observations, patient history, and epidemiological information. The expected result is Negative.  Fact Sheet for Patients:  EntrepreneurPulse.com.au  Fact Sheet for Healthcare Providers:  IncredibleEmployment.be  This test is no t yet approved or cleared by the [place] FDA and  has been authorized for detection and/or diagnosis of SARS-CoV-2 by FDA under an Emergency Use Authorization (EUA). This EUA will remain  in effect (meaning this test can be used) for the duration of the COVID-19 declaration under Section 564(b)(1) of the Act, 21 U.S.C.section 360bbb-3(b)(1), unless the authorization is terminated  or revoked sooner.       Influenza A by PCR NEGATIVE NEGATIVE Final   Influenza B by PCR NEGATIVE NEGATIVE Final  Comment: (NOTE) The Xpert Xpress SARS-CoV-2/FLU/RSV plus assay is intended as an aid in the diagnosis of influenza from Nasopharyngeal swab specimens and should not be used as a sole basis for treatment. Nasal washings and aspirates are unacceptable for Xpert Xpress SARS-CoV-2/FLU/RSV testing.  Fact Sheet for Patients: EntrepreneurPulse.com.au  Fact Sheet for Healthcare Providers: IncredibleEmployment.be  This test is not yet approved or cleared by the [place] FDA and has been authorized for detection and/or diagnosis of SARS-CoV-2 by FDA under an Emergency Use Authorization (EUA). This EUA will remain in effect (meaning this test can be used) for the duration of the COVID-19 declaration under Section 564(b)(1) of the Act, 21 U.S.C. section 360bbb-3(b)(1), unless the authorization is terminated or revoked.  Performed at [facility], [address] [place], [place] [address]   MRSA Next Gen by PCR, Nasal     Status: None   Collection Time: 06/07/21  8:00 AM    Specimen: Nasal Mucosa; Nasal Swab  Result Value Ref Range Status   MRSA by PCR Next Gen NOT DETECTED NOT DETECTED Final    Comment: (NOTE) The GeneXpert MRSA Assay (FDA approved for NASAL specimens only), is one component of a comprehensive MRSA colonization surveillance program. It is not intended to diagnose MRSA infection nor to guide or monitor treatment for MRSA infections. Test performance is not FDA approved in patients less than 60 years old. Performed at [facility], [place], [place] [address]   Respiratory (~20 pathogens) panel by PCR     Status: None   Collection Time: 06/07/21  1:51 PM   Specimen: Nasopharyngeal Swab; Respiratory  Result Value Ref Range Status   Adenovirus NOT DETECTED NOT DETECTED Final   Coronavirus 229E NOT DETECTED NOT DETECTED Final    Comment: (NOTE) The Coronavirus on the Respiratory Panel, DOES NOT test for the novel  Coronavirus (2019 nCoV)    Coronavirus HKU1 NOT DETECTED NOT DETECTED Final   Coronavirus NL63 NOT DETECTED NOT DETECTED Final   Coronavirus OC43 NOT DETECTED NOT DETECTED Final   Metapneumovirus NOT DETECTED NOT DETECTED Final   Rhinovirus / Enterovirus NOT DETECTED NOT DETECTED Final   Influenza A NOT DETECTED NOT DETECTED Final   Influenza B NOT DETECTED NOT DETECTED Final   Parainfluenza Virus 1 NOT DETECTED NOT DETECTED Final   Parainfluenza Virus 2 NOT DETECTED NOT DETECTED Final   Parainfluenza Virus 3 NOT DETECTED NOT DETECTED Final   Parainfluenza Virus 4 NOT DETECTED NOT DETECTED Final   Respiratory Syncytial Virus NOT DETECTED NOT DETECTED Final   Bordetella pertussis NOT DETECTED NOT DETECTED Final   Bordetella Parapertussis NOT DETECTED NOT DETECTED Final   Chlamydophila pneumoniae NOT DETECTED NOT DETECTED Final   Mycoplasma pneumoniae NOT DETECTED NOT DETECTED Final    Comment: Performed at [facility], [place]. 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 EXAM: PORTABLE CHEST - 1 VIEW COMPARISON:  05/31/2021 FINDINGS: Interval progression of extensive bilateral asymmetric airspace opacities most confluent in the right mid and upper lung, and left lower lung, with enlarging focal opacity centrally in the left upper lung and worsening interstitial and airspace opacities at the right lung base. Heart size upper  limits normal for technique. Previous AVR. CABG markers. Blunting of lateral costophrenic angles suggesting effusions. No pneumothorax. Sternotomy wires. IMPRESSION: 1. Worsening asymmetric extensive airspace infiltrates or edema. 2. Suspect small pleural effusions. Electronically Signed   By: Lucrezia Europe M.D.   On: 06/07/2021 07:51        Scheduled Meds:  azelastine  1 spray Each Nare BID   Chlorhexidine Gluconate Cloth  6 each Topical Daily   clopidogrel  75 mg Oral Daily   DULoxetine  20 mg Oral Q breakfast   enoxaparin (LOVENOX) injection  0.5 mg/kg Subcutaneous Q24H   FLUoxetine  40 mg Oral BID   insulin aspart  0-15 Units Subcutaneous TID WC   insulin aspart  0-5 Units Subcutaneous QHS   insulin glargine-yfgn  100 Units Subcutaneous QHS   levothyroxine  125 mcg Oral Q0600   mouth rinse  15 mL Mouth Rinse q12n4p   methylPREDNISolone (SOLU-MEDROL) injection  20 mg Intravenous Daily   pantoprazole (PROTONIX) IV  40 mg Intravenous Daily   Continuous Infusions:  sodium chloride Stopped (06/07/21 1920)   dexmedetomidine (PRECEDEX) IV infusion 1.2 mcg/kg/hr (06/08/21 1022)   doxycycline (VIBRAMYCIN) IV     norepinephrine (LEVOPHED) Adult infusion Stopped (06/07/21 2346)    LOS: 4 days   Time spent: 50min  Kayleen Memos, DO [facility]  If 7PM-7AM,  please contact night-coverage [email or web address]  06/08/2021, 11:26 AM

## 2021-06-09 LAB — GLUCOSE, CAPILLARY
Glucose-Capillary: 118 mg/dL — ABNORMAL HIGH (ref 70–99)
Glucose-Capillary: 47 mg/dL — ABNORMAL LOW (ref 70–99)
Glucose-Capillary: 246 mg/dL — ABNORMAL HIGH (ref 70–99)
Glucose-Capillary: 168 mg/dL — ABNORMAL HIGH (ref 70–99)
Glucose-Capillary: 190 mg/dL — ABNORMAL HIGH (ref 70–99)

## 2021-06-09 LAB — CBC
HCT: 24 % — ABNORMAL LOW (ref 36.0–46.0)
Hemoglobin: 7.3 g/dL — ABNORMAL LOW (ref 12.0–15.0)
MCH: 25.3 pg — ABNORMAL LOW (ref 26.0–34.0)
MCHC: 30.4 g/dL (ref 30.0–36.0)
MCV: 83.3 fL (ref 80.0–100.0)
Platelets: 180 10*3/uL (ref 150–400)
RBC: 2.88 MIL/uL — ABNORMAL LOW (ref 3.87–5.11)
RDW: 15.9 % — ABNORMAL HIGH (ref 11.5–15.5)
WBC: 11.2 10*3/uL — ABNORMAL HIGH (ref 4.0–10.5)
nRBC: 0 % (ref 0.0–0.2)

## 2021-06-09 LAB — BASIC METABOLIC PANEL
Anion gap: 11 (ref 5–15)
BUN: 57 mg/dL — ABNORMAL HIGH (ref 8–23)
CO2: 26 mmol/L (ref 22–32)
Calcium: 8.6 mg/dL — ABNORMAL LOW (ref 8.9–10.3)
Chloride: 104 mmol/L (ref 98–111)
Creatinine, Ser: 1.42 mg/dL — ABNORMAL HIGH (ref 0.44–1.00)
GFR, Estimated: 38 mL/min — ABNORMAL LOW (ref >60)
Glucose, Bld: 66 mg/dL — ABNORMAL LOW (ref 70–99)
Potassium: 4.4 mmol/L (ref 3.5–5.1)
Sodium: 141 mmol/L (ref 135–145)

## 2021-06-09 LAB — PHOSPHORUS: Phosphorus: 7.5 mg/dL — ABNORMAL HIGH (ref 2.5–4.6)

## 2021-06-09 LAB — PROCALCITONIN: Procalcitonin: 1.46 ng/mL

## 2021-06-09 MED ORDER — ADULT MULTIVITAMIN W/MINERALS CH: 1.0000 | ORAL_TABLET | Freq: Every day | ORAL | Status: DC

## 2021-06-09 MED ORDER — MIDAZOLAM HCL 2 MG/2ML IJ SOLN
1.0000 mg | Freq: Once | INTRAMUSCULAR | Status: AC
  Administered 2021-06-09: 1 mg via INTRAVENOUS
  Filled 2021-06-09: qty 2

## 2021-06-09 MED ORDER — NEPRO/CARBSTEADY PO LIQD
237.0000 mL | Freq: Two times a day (BID) | ORAL | Status: DC
  Administered 2021-06-09: 237 mL via ORAL

## 2021-06-09 MED ORDER — FOOD THICKENER (SIMPLYTHICK HONEY)
1.0000 | ORAL | Status: DC | PRN
  Filled 2021-06-09: qty 1

## 2021-06-09 MED ORDER — DEXTROSE 50 % IV SOLN
INTRAVENOUS | Status: AC
  Administered 2021-06-09: 50 mL
  Filled 2021-06-09: qty 50

## 2021-06-09 MED ORDER — MIDAZOLAM HCL 2 MG/2ML IJ SOLN
1.0000 mg | Freq: Once | INTRAMUSCULAR | Status: AC
  Administered 2021-06-10: 1 mg via INTRAVENOUS
  Filled 2021-06-09: qty 2

## 2021-06-09 MED ORDER — CALCIUM ACETATE (PHOS BINDER) 667 MG PO CAPS
667.0000 mg | ORAL_CAPSULE | Freq: Three times a day (TID) | ORAL | Status: DC
  Administered 2021-06-09 – 2021-06-10 (×2): 667 mg via ORAL
  Filled 2021-06-09 (×3): qty 1

## 2021-06-09 MED ORDER — HALOPERIDOL LACTATE 5 MG/ML IJ SOLN
5.0000 mg | Freq: Once | INTRAMUSCULAR | Status: AC
  Administered 2021-06-09: 5 mg via INTRAVENOUS
  Filled 2021-06-09: qty 1

## 2021-06-09 MED ORDER — MELATONIN 5 MG PO TABS: 5.0000 mg | ORAL_TABLET | Freq: Every evening | ORAL | Status: DC | PRN

## 2021-06-09 MED ORDER — FUROSEMIDE 10 MG/ML IJ SOLN
40.0000 mg | Freq: Once | INTRAMUSCULAR | Status: AC
  Administered 2021-06-09: 40 mg via INTRAVENOUS
  Filled 2021-06-09: qty 4

## 2021-06-09 MED ORDER — INSULIN GLARGINE-YFGN 100 UNIT/ML ~~LOC~~ SOLN
50.0000 [IU] | Freq: Every day | SUBCUTANEOUS | Status: DC
  Administered 2021-06-09 – 2021-06-11 (×3): 50 [IU] via SUBCUTANEOUS
  Filled 2021-06-09 (×4): qty 0.5

## 2021-06-09 MED ORDER — FENTANYL CITRATE PF 50 MCG/ML IJ SOSY
50.0000 ug | PREFILLED_SYRINGE | Freq: Once | INTRAMUSCULAR | Status: AC
  Administered 2021-06-10: 50 ug via INTRAVENOUS
  Filled 2021-06-09: qty 1

## 2021-06-09 MED ORDER — LORAZEPAM 1 MG PO TABS
0.5000 mg | ORAL_TABLET | Freq: Four times a day (QID) | ORAL | Status: DC | PRN
  Administered 2021-06-09 – 2021-06-11 (×7): 0.5 mg via ORAL
  Filled 2021-06-09 (×7): qty 1

## 2021-06-09 MED ORDER — DEXTROSE 50 % IV SOLN: 1.0000 | Freq: Once | INTRAVENOUS | Status: AC

## 2021-06-09 NOTE — Progress Notes (Signed)
Inpatient Diabetes Program Recommendations  AACE/ADA: New Consensus Statement on Inpatient Glycemic Control (2015)  Target Ranges:  Prepandial:   less than 140 mg/dL      Peak postprandial:   less than 180 mg/dL (1-2 hours)      Critically ill patients:  140 - 180 mg/dL   Lab Results  Component Value Date   GLUCAP 118 (H) 06/09/2021   HGBA1C 6.2 (H) 04/14/2021    Review of Glycemic Control  Latest Reference Range & Units 06/08/21 07:40 06/08/21 11:37 06/08/21 16:24 06/08/21 17:08 06/08/21 21:04 06/09/21 07:35 06/09/21 08:14  Glucose-Capillary 70 - 99 mg/dL 89 70 56 (L) 117 (H) 94 47 (L) 118 (H)  (L): Data is abnormally low (H): Data is abnormally high Diabetes history: DM2 Outpatient Diabetes medications: Basaglar 100 units qd, Humalog 36-66 units tid meal coverage, Glucotrol XL 10 mg qd Current orders for Inpatient glycemic control: Semglee 100 units qd,, Novolog 0-15 units tid + hs 0-5 units, Solumdrol 20 mg q d  Inpatient Diabetes Program Recommendations:   Noted hypoglycemia. -Decrease Semglee to 80 units qd. Secure chat sent to Dr. Nevada Crane.  Thank you, [name]. Arlan Birks, RN, MSN, CDE  Diabetes Coordinator Inpatient Glycemic Control Team Team Pager [phone] (8am-5pm) 06/09/2021 10:27 AM

## 2021-06-09 NOTE — Progress Notes (Signed)
Hypoglycemic Event  CBG: 47  Treatment: D50 50 mL (25 gm)  Symptoms: None  Follow-up CBG: Time:  CBG Result:  Possible Reasons for Event: Medication regimen:      Comments/MD notified:No per protocol    Marijean Niemann

## 2021-06-09 NOTE — Progress Notes (Signed)
CRITICAL CARE         Date: 06/09/2021,   MRN# [id number] [name] 1946-02-05     AdmissionWeight: 98.1 kg                 CurrentWeight: 95.5 kg   Referring physician: Dr Jimmye Norman   CHIEF COMPLAINT:   Severe acute on chronic hypoxemic respiratory failure   HISTORY OF PRESENT ILLNESS   76 yo with hx of CABG 2015 , depression,anxiety, hypertension, OSA, COPD, iron deficiency anemia, aortic dilatation, presents emergency department for cc of dyspnea and sob. She is chronically hypoxemic with 3L/min [place] requirement. Came in for acute onset of sob/doe was negative for PE on admission. She is on BIPAP in resp distress.She denies recent fever, nausea, vomiting, abdominal pain, dysuria, diarrhea, hematuria, blood in her stool, syncope, lost of consciousness. She reports chest pain and severe SOB.She has severe anxiety and recurrent panic attacks She reports no new cough. She is unsure if she has had weight gain or lost. She denies flu like illness. She denies LE edema. PCCM consult placed for further evaluation and management.   06/08/21- patient is improved, she did have few episodes of mild bradycardia 40s. I Met with son and husband today to review medical plan and prognosis. They have met with palliative care today.   06/09/21- patient had severe and recurrent anxiety.  She had multiple episodes of severe respiratory distress with O2 100% and only responding to anxiolytics and sedatives.    PAST MEDICAL HISTORY   Past Medical History:  Diagnosis Date   1st degree AV block    ACE-inhibitor cough    Allergic rhinitis    Anemia    iron deficiency anemia   Anxiety    Aortic ectasia (HCC)    a. CT abd in 12/2016 incidentally noted aortic atherosclerosis and infrarenal abdominal aortic ectasia measuring as large as 2.7 cm with recommendation to repeat US in 2023.   Aortic stenosis    a. 10/2013 s/p AVR; b. 04/2021 s/p TAVR.   Arthritis    Asthma    Cataract    Chronic  depression    Chronic diastolic CHF (congestive heart failure) (HCC)    Chronic headache    CKD (chronic kidney disease), stage III (HCC)    COPD (chronic obstructive pulmonary disease) (HCC)    Coronary artery disease    a. DES to RCA and mid Cx 2009. b. CABG x 2 and bioprosthetic AVR May 2015. c. cutting balloon to prox Cx in 05/2016; d. 11/2020 PCI/DES to LCX and VG->RCA (prox and distal); e. 02/2021 Cath: LM nl, LAD 169mCTO, D1 min irregs, LCX 50p/m ISR, patent mid stent, RCA 20ost, 80p, 1070mRPDA nl, VG->dRCA patent p/d stents, LIMA->LAD patent.   Dementia (H[place]   Diverticulitis of colon    Essential hypertension    GERD (gastroesophageal reflux disease)    Hearing loss    History of blood transfusion 2013   History of prosthetic aortic valve replacement    a. 10/2013 s/p bioprosthetic AVR; b. 04/2021 s/p TAVR w/ EdOletta Lamasapien 3 THV (2344m c. 05/2021 Echo: EF 55-60%, no PV leak, 53m75mmean gradient; d. 06/2021 Echo: EF 55-60%, mod LVH. GrII DD. Nl RV size/fxn. Mildly dil LA. Small peric eff w/o tamponade. Triv MR. Mild-mod MS (mean grad 8mmH17m Nl fxn'ing AoV - peak grad 4.1mmHg31mean grad 2.0mmHg.40mA 4.24cm^2 (VTI).   HOH (hard of hearing)    Hypercholesterolemia  a. intolerance of statins and niaspan -   IDA (iron deficiency anemia) 02/03/2019   Mitral stenosis    a. 06/2021 Echo: Mild-mod MS (mean gradient 52mHg.   Mobitz type 1 second degree AV block    OSA (obstructive sleep apnea)    mild, intolerant of cpap   PAD (peripheral artery disease) ([place]    a. atherosclerosis by CT abd 12/2016 in LE.   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 MSumner Boast& Dr. BCyndia Bent b. 05/2021 Echo: EF 55-60%, no PV leak, 332mg mean gradient; c. 06/2021 Echo: EF 55-60%, mod LVH. GrII DD. Nl fxn'ing AoV - peak grad 4.50m26m, mean grad 2.0mm650m AVA 4.24cm^2  (VTI).   Statin intolerance    Thyroid disease    Type II diabetes mellitus (HCC)[place]   SURGICAL HISTORY   Past Surgical History:  Procedure Laterality Date   ABDOMINAL HYSTERECTOMY     ABDOMINAL HYSTERECTOMY W/ PARTIAL VAGINACTOMY     AORTIC VALVE REPLACEMENT N/A 10/12/2013   Procedure: AORTIC VALVE REPLACEMENT (AVR);  Surgeon: BryaGaye Pollack;  [facility] Surgery;  Laterality: N/A;   APPENDECTOMY  1964   BARTHOLIN GLAND CYST EXCISION     BLADDER SUSPENSION     BREAST BIOPSY Bilateral 09/11/2000   neg   BREAST BIOPSY Left 07/24/2010   neg   BREAST CYST EXCISION  1988   bilateral nonmalignant tumors, x3   CARDIAC CATHETERIZATION     CARDIAC CATHETERIZATION N/A 05/25/2016   Procedure: Coronary Balloon Angioplasty;  Surgeon: DaviLeonie Man;  [facility];  Service: Cardiovascular;  Laterality: N/A;   CARDIAC CATHETERIZATION N/A 05/25/2016   Procedure: Coronary/Graft Angiography;  Surgeon: DaviLeonie Man;  [facility];  Service: Cardiovascular;  Laterality: N/A;   CATARACT EXTRACTION W/ INTRAOCULAR LENS  IMPLANT, BILATERAL     CHOLECYSTECTOMY  2001   COLECTOMY     lap sigmoid   COLONOSCOPY  2014   polyps found, 2 clamped off.   CORONARY ANGIOPLASTY  10/29/2007   Prox RCA & Mid Cx.   CORONARY ARTERY BYPASS GRAFT N/A 10/12/2013   Procedure: CORONARY ARTERY BYPASS GRAFT TIMES TWO;  Surgeon: BryaGaye Pollack;  [facility]  Service: Open Heart Surgery;  Laterality: N/A;   CORONARY/GRAFT ANGIOGRAPHY N/A 09/20/2017   Procedure: CORONARY/GRAFT ANGIOGRAPHY;  Surgeon: CoopSherren Mocha;  [facility];  Service: Cardiovascular;  Laterality: N/A;   INTRAOPERATIVE TRANSTHORACIC ECHOCARDIOGRAM N/A 04/18/2021   Procedure: INTRAOPERATIVE TRANSTHORACIC ECHOCARDIOGRAM;  Surgeon: McAlBurnell Blanks;  [facility];  Service: Open Heart Surgery;  Laterality: N/A;   LEFT HEART  CATHETERIZATION WITH CORONARY ANGIOGRAM N/A 10/09/2013   Procedure: LEFT HEART CATHETERIZATION WITH CORONARY ANGIOGRAM;  Surgeon: ChriBurnell Blanks;  [facility];  Service: Cardiovascular;  Laterality: N/A;   RIGHT/LEFT HEART CATH AND CORONARY ANGIOGRAPHY N/A 11/03/2020   Procedure: RIGHT/LEFT HEART CATH AND CORONARY ANGIOGRAPHY;  Surgeon: KowaCorey Skains;  [facility];  Service: Cardiovascular;  Laterality: N/A;   RIGHT/LEFT HEART CATH AND CORONARY/GRAFT ANGIOGRAPHY N/A 02/10/2021   Procedure: RIGHT/LEFT HEART CATH AND CORONARY/GRAFT ANGIOGRAPHY;  Surgeon: OrgeAndrez Grime;  [facility];  Service: Cardiovascular;  Laterality: N/A;   STERNAL  WIRES REMOVAL N/A 04/13/2014   Procedure: STERNAL WIRES REMOVAL;  Surgeon: [name], MD;  Location: [place];  Service: Thoracic;  Laterality: N/A;   TEE WITHOUT CARDIOVERSION N/A 11/03/2020   Procedure: TRANSESOPHAGEAL ECHOCARDIOGRAM (TEE);  Surgeon: Corey Skains, MD;  Location: [facility];  Service: Cardiovascular;  Laterality: N/A;   TEE WITHOUT CARDIOVERSION N/A 02/13/2021   Procedure: TRANSESOPHAGEAL ECHOCARDIOGRAM (TEE);  Surgeon: Corey Skains, MD;  Location: [facility];  Service: Cardiovascular;  Laterality: N/A;   THYROIDECTOMY     TONSILLECTOMY     TRANSCATHETER AORTIC VALVE REPLACEMENT, TRANSFEMORAL N/A 04/18/2021   Procedure: TRANSCATHETER AORTIC VALVE REPLACEMENT, TRANSFEMORAL;  Surgeon: [name], MD;  Location: [place] [facility];  Service: Open Heart Surgery;  Laterality: N/A;   TUBAL LIGATION     VAGINAL DELIVERY     3   VISCERAL ARTERY INTERVENTION N/A 08/16/2016   Procedure: Visceral Artery Intervention;  Surgeon: Algernon Huxley, MD;  Location: [place] [facility] LAB;  Service: Cardiovascular;  Laterality: N/A;     FAMILY HISTORY   Family History  Problem Relation Age of Onset   Breast cancer Mother 57   Hypertension Father    Mesothelioma Father     Asthma Father    Stroke Paternal Grandfather    Heart disease Other    Breast cancer Maternal Aunt    Breast cancer Paternal Aunt      SOCIAL HISTORY   Social History   Tobacco Use   Smoking status: Former    Packs/day: 0.50    Years: 30.00    Pack years: 15.00    Types: Cigarettes    Quit date: 10/02/2013    Years since quitting: 7.6   Smokeless tobacco: Never  Vaping Use   Vaping Use: Never used  Substance Use Topics   Alcohol use: No   Drug use: No     MEDICATIONS    Home Medication:    Current Medication:  Current Facility-Administered Medications:    0.9 %  sodium chloride infusion, 250 mL, Intravenous, Continuous, Daveion Robar, MD, Stopped at 06/09/21 0938   albuterol (PROVENTIL) (2.5 MG/3ML) 0.083% nebulizer solution 2.5 mg, 2.5 mg, Nebulization, Q6H PRN, Cox, Amy N, DO, 2.5 mg at 06/07/21 0204   azelastine (ASTELIN) 0.1 % nasal spray 1 spray, 1 spray, Each Nare, BID, Cox, Amy N, DO, 1 spray at 06/09/21 0929   benzonatate (TESSALON) capsule 200 mg, 200 mg, Oral, TID PRN, Cox, Amy N, DO, 200 mg at 06/07/21 0608   Chlorhexidine Gluconate Cloth 2 % PADS 6 each, 6 each, Topical, Daily, Irene Pap N, DO, 6 each at 06/09/21 3662   clopidogrel (PLAVIX) tablet 75 mg, 75 mg, Oral, Daily, Cox, Amy N, DO, 75 mg at 06/09/21 0928   dexmedetomidine (PRECEDEX) 400 MCG/100ML (4 mcg/mL) infusion, 0.4-1.2 mcg/kg/hr, Intravenous, Titrated, Kasir Hallenbeck, MD, Last Rate: 19.52 mL/hr at 06/09/21 0939, 0.8 mcg/kg/hr at 06/09/21 0939   doxycycline (VIBRAMYCIN) 100 mg in sodium chloride 0.9 % 250 mL IVPB, 100 mg, Intravenous, Q12H, Beers, Shanon Brow, RPH, Last Rate: 125 mL/hr at 06/09/21 0939, Infusion Verify at 06/09/21 0939   DULoxetine (CYMBALTA) DR capsule 20 mg, 20 mg, Oral, Q breakfast, Cox, Amy N, DO, 20 mg at 06/09/21 0928   enoxaparin (LOVENOX) injection 50 mg, 0.5 mg/kg, Subcutaneous, Q24H, Hall, Carole N, DO, 50 mg at 06/08/21 2111   fentaNYL (SUBLIMAZE) injection 25 mcg,  25 mcg, Intravenous, Q2H PRN, Jordan Hawks, FNP, 25 mcg at 06/09/21 1051  FLUoxetine (PROZAC) capsule 40 mg, 40 mg, Oral, BID, Cox, Amy N, DO, 40 mg at 06/09/21 4944   food thickener (SIMPLYTHICK (HONEY/LEVEL 3/MODERATELY THICK)) 1 packet, 1 packet, Oral, PRN, [place] Crane, Carole N, DO   insulin aspart (novoLOG) injection 0-15 Units, 0-15 Units, Subcutaneous, TID WC, Cox, Amy N, DO, 3 Units at 06/07/21 1645   insulin aspart (novoLOG) injection 0-5 Units, 0-5 Units, Subcutaneous, QHS, Cox, Amy N, DO, 5 Units at 06/06/21 2307   insulin glargine-yfgn (SEMGLEE) injection 50 Units, 50 Units, Subcutaneous, QHS, Hall, Carole N, DO   levothyroxine (SYNTHROID) tablet 125 mcg, 125 mcg, Oral, Q0600, Cox, Amy N, DO, 125 mcg at 06/09/21 0927   LORazepam (ATIVAN) injection 0.5 mg, 0.5 mg, Intravenous, Q8H PRN, [place] Crane, Carole N, DO, 0.5 mg at 06/07/21 2041   LORazepam (ATIVAN) tablet 0.5 mg, 0.5 mg, Oral, Q6H PRN, [place] Crane, Carole N, DO, 0.5 mg at 06/09/21 1030   MEDLINE mouth rinse, 15 mL, Mouth Rinse, q12n4p, Ottie Glazier, MD, 15 mL at 06/08/21 1630   methylPREDNISolone sodium succinate (SOLU-MEDROL) 40 mg/mL injection 20 mg, 20 mg, Intravenous, Daily, Ottie Glazier, MD, 20 mg at 06/09/21 9675   nitroGLYCERIN (NITROSTAT) SL tablet 0.4 mg, 0.4 mg, Sublingual, Q5 min PRN, Cox, Amy N, DO   norepinephrine (LEVOPHED) 35m in 2582m(0.016 mg/mL) premix infusion, 2-10 mcg/min, Intravenous, Titrated, AlOttie GlazierMD, Stopped at 06/07/21 2346   ondansetron (ZOFRAN) tablet 4 mg, 4 mg, Oral, Q6H PRN **OR** ondansetron (ZOFRAN) injection 4 mg, 4 mg, Intravenous, Q6H PRN, Cox, Amy N, DO, 4 mg at 06/06/21 2007   pantoprazole (PROTONIX) injection 40 mg, 40 mg, Intravenous, Daily, ChBenita GutterRPH, 40 mg at 06/09/21 [id number]  ALLERGIES   Amitriptyline, Benadryl [diphenhydramine], Demerol [meperidine], Gabapentin, Mirtazapine, Olanzapine, Voltaren [diclofenac sodium], Zetia [ezetimibe], Ativan [lorazepam], Atorvastatin,  Budesonide-formoterol fumarate, Bupropion hcl, Caffeine, Codeine sulfate, Lisinopril, Metformin, Mometasone furoate, Morphine sulfate, Other, Oxycodone-acetaminophen, Pioglitazone, Propoxyphene n-acetaminophen, Rosuvastatin, Shellfish allergy, Suvorexant, Ticagrelor, Tramadol, Venlafaxine, Xanax [alprazolam], Zolpidem tartrate, and Latex     REVIEW OF SYSTEMS    Review of Systems:  Gen:  Denies  fever, sweats, chills weigh loss  HEENT: Denies blurred vision, double vision, ear pain, eye pain, hearing loss, nose bleeds, sore throat Cardiac:  No dizziness, chest pain or heaviness, chest tightness,edema Resp:   Denies cough or sputum porduction, shortness of breath,wheezing, hemoptysis,  Gi: Denies swallowing difficulty, stomach pain, nausea or vomiting, diarrhea, constipation, bowel incontinence Gu:  Denies bladder incontinence, burning urine Ext:   Denies Joint pain, stiffness or swelling Skin: Denies  skin rash, easy bruising or bleeding or hives Endoc:  Denies polyuria, polydipsia , polyphagia or weight change Psych:   Denies depression, insomnia or hallucinations   Other:  All other systems negative   VS: BP (!) 107/57    Pulse (!) 55    Temp 98 F (36.7 C) (Axillary)    Resp (!) 22    Ht '5\' 6"'  (1.676 m)    Wt 95.5 kg    SpO2 100%    BMI 33.98 kg/m      PHYSICAL EXAM    GENERAL:NAD, no fevers, chills, no weakness no fatigue HEAD: Normocephalic, atraumatic.  EYES: Pupils equal, round, reactive to light. Extraocular muscles intact. No scleral icterus.  MOUTH: Moist mucosal membrane. Dentition intact. No abscess noted.  EAR, NOSE, THROAT: Clear without exudates. No external lesions.  NECK: Supple. No thyromegaly. No nodules. No JVD.  PULMONARY: BIPAP lung sounds CARDIOVASCULAR: S1 and S2. Regular rate  and rhythm. No murmurs, rubs, or gallops. No edema. Pedal pulses 2+ bilaterally.  GASTROINTESTINAL: Soft, nontender, nondistended. No masses. Positive bowel sounds. No  hepatosplenomegaly.  MUSCULOSKELETAL: No swelling, clubbing, or edema. Range of motion full in all extremities.  NEUROLOGIC: Cranial nerves II through XII are intact. No gross focal neurological deficits. Sensation intact. Reflexes intact.  SKIN: No ulceration, lesions, rashes, or cyanosis. Skin warm and dry. Turgor intact.  PSYCHIATRIC: severely anxious      IMAGING    CT HEAD WO CONTRAST (5MM)  Result Date: 06/08/2021 CLINICAL DATA:  Pneumonia, complication suspected. Malaise, weakness EXAM: CT HEAD WITHOUT CONTRAST TECHNIQUE: Contiguous axial images were obtained from the base of the skull through the vertex without intravenous contrast. COMPARISON:  Brain MRI 10/11/2016 FINDINGS: Brain: There is no evidence of acute intracranial hemorrhage, extra-axial fluid collection, or acute infarct. There is an area of encephalomalacia in the left temporoparietal region consistent with remote infarct, new since the prior brain MRI from 2018. There is an additional small remote lacunar infarct in the right cerebellar hemisphere. Background parenchymal volume is normal. The ventricles are normal in size. Scattered foci of hypodensity in the remainder of the subcortical and periventricular white matter likely reflects sequela of chronic white matter microangiopathy. A small focus of calcification along the falx likely corresponds to the meningioma seen on the prior brain MRI from 2018. There is no other solid mass lesion. There is no midline shift. Vascular: There is calcification of the bilateral cavernous ICAs. Skull: Normal. Negative for fracture or focal lesion. Sinuses/Orbits: The paranasal sinuses are clear. Bilateral lens implants are in place. The globes and orbits are otherwise unremarkable. Other: There is trace fluid in the mastoid air cells bilaterally. IMPRESSION: 1. 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 2. No  evidence of acute intracranial pathology. 3. Trace bilateral mastoid effusions. Electronically Signed   By: Valetta Mole M.D.   On: 06/08/2021 14:57   CT CHEST WO CONTRAST  Result Date: 06/08/2021 CLINICAL DATA:  Malaise. Weakness. Dyspnea on exertion. Community-acquired pneumonia and COPD exacerbation. On IV antibiotics. Known aortic dilatation. Aortic valve repair. EXAM: CT CHEST WITHOUT CONTRAST TECHNIQUE: Multidetector CT imaging of the chest was performed following the standard protocol without IV contrast. COMPARISON:  Chest radiograph 1 day prior.  CTA chest 05/10/2021. FINDINGS: Moderate motion degradation. Cardiovascular: Aortic atherosclerosis. Tortuous thoracic aorta. Aortic valve repair. Moderate cardiomegaly, without pericardial effusion. Median sternotomy for CABG. Pulmonary artery enlargement, outflow tract 4.0 cm Mediastinum/Nodes: 1.9 cm right paratracheal node is similar to on the prior. 1.4 cm node within the azygoesophageal recess is unchanged. Hilar regions poorly evaluated without intravenous contrast. 12 mm prevascular node is unchanged. Lungs/Pleura: Trace left pleural fluid or thickening is similar. Significantly worsened aeration since 05/14/2021. Relatively diffuse ground-glass and less so airspace disease with areas of septal thickening. Relative sparing of the left apex and superior segment left lower lobe. No areas of extraalveolar air.  No pneumothorax. Upper Abdomen: Caudate and lateral segment left liver lobe enlargement. Normal imaged portions of the spleen, stomach, adrenal glands. Cholecystectomy. Musculoskeletal: No acute osseous abnormality. IMPRESSION: 1. Moderately motion degraded exam. 2. Progression of relatively diffuse airspace and ground-glass opacity. Given lack of significant pleural fluid, favor infection, including atypical etiologies. Recommend clinical exclusion of COVID-19. Pulmonary edema felt less likely. 3. Similar thoracic adenopathy, favored to be reactive.  4. Pulmonary artery enlargement suggests pulmonary arterial hypertension. 5.  Aortic Atherosclerosis (ICD10-I70.0). 6.  Suspicion of mild cirrhosis.  Correlate with risk factors. Electronically Signed   By: Abigail Miyamoto M.D.   On: 06/08/2021 14:56   CT Angio Chest Pulmonary Embolism (PE) W or WO Contrast  Result Date: 06/04/2021 CLINICAL DATA:  Pulmonary embolism (PE) suspected, positive D-dimer EXAM: CT ANGIOGRAPHY CHEST WITH CONTRAST TECHNIQUE: Multidetector CT imaging of the chest was performed using the standard protocol during bolus administration of intravenous contrast. Multiplanar CT image reconstructions and MIPs were obtained to evaluate the vascular anatomy. CONTRAST:  36m OMNIPAQUE IOHEXOL 350 MG/ML SOLN COMPARISON:  02/01/2021 FINDINGS: Cardiovascular: Adequate opacification of the pulmonary arterial tree. No intraluminal filling defect identified to suggest acute pulmonary embolism. Central pulmonary arteries are enlarged in keeping with changes of pulmonary arterial hypertension, unchanged. Transcatheter aortic valve replacement has been performed. Multi-vessel coronary artery stenting has been performed as well as coronary artery bypass grafting. Cardiomegaly is present, stable. No pericardial effusion. Extensive atheromatous plaque is seen within the thoracic aorta demonstrating mural irregularity and numerous shallow ulcer like projections. No aortic aneurysm. Mediastinum/Nodes: The thyroid gland is absent or atretic. The esophagus is unremarkable. There is interval enlargement of multiple pathologically enlarged lymph nodes within the right paratracheal and prevascular lymph node groups which measure up to 19 mm in short axis diameter. Numerous shotty bilateral hilar lymph nodes are also identified. Lungs/Pleura: Extensive bilateral asymmetric ground-glass pulmonary infiltrate has developed, most in keeping with atypical infection in the acute setting. No pneumothorax or pleural effusion. No  central obstructing lesion. Upper Abdomen: No acute abnormality. Musculoskeletal: No acute bone abnormality. No lytic or blastic bone lesion. Review of the MIP images confirms the above findings. IMPRESSION: No pulmonary embolism. Extensive multifocal pulmonary infiltrate most in keeping with atypical infection in the acute setting. Stable cardiomegaly. Aortic Atherosclerosis (ICD10-I70.0). Electronically Signed   By: AFidela SalisburyM.D.   On: 06/04/2021 00:04   UK[place]Venous Img Lower Unilateral Left  Result Date: 05/15/2021 CLINICAL DATA:  Initial evaluation for acute swelling of left calf, evaluate for DVT. EXAM: Left LOWER EXTREMITY VENOUS DOPPLER ULTRASOUND TECHNIQUE: Gray-scale sonography with graded compression, as well as color Doppler and duplex ultrasound were performed to evaluate the lower extremity deep venous systems from the level of the common femoral vein and including the common femoral, femoral, profunda femoral, popliteal and calf veins including the posterior tibial, peroneal and gastrocnemius veins when visible. The superficial great saphenous vein was also interrogated. Spectral Doppler was utilized to evaluate flow at rest and with distal augmentation maneuvers in the common femoral, femoral and popliteal veins. COMPARISON:  None. FINDINGS: Contralateral Common Femoral Vein: Respiratory phasicity is normal and symmetric with the symptomatic side. No evidence of thrombus. Normal compressibility. Common Femoral Vein: No evidence of thrombus. Normal compressibility, respiratory phasicity and response to augmentation. Saphenofemoral Junction: No evidence of thrombus. Normal compressibility and flow on color Doppler imaging. Profunda Femoral Vein: No evidence of thrombus. Normal compressibility and flow on color Doppler imaging. Femoral Vein: No evidence of thrombus. Normal compressibility, respiratory phasicity and response to augmentation. Popliteal Vein: No evidence of thrombus. Normal  compressibility, respiratory phasicity and response to augmentation. Calf Veins: No evidence of thrombus. Normal compressibility and flow on color Doppler imaging. Superficial Great Saphenous Vein: No evidence of thrombus. Normal compressibility. Venous Reflux:  None. Other Findings:  None. IMPRESSION: No evidence of deep venous thrombosis. Electronically Signed   By: BJeannine BogaM.D.   On: 05/16/2021 19:41   DG Chest Port 1 View  Result Date: 06/07/2021 CLINICAL DATA:  Reason for exam: SOB Hx of CHF, COPD, asthma, aortic valve repair, history of bioprosthetic aortic valve replacement failure with severe AS and resulting in TAVR on 04/20/2021, Rapid Response Nurse currently in room. EXAM: PORTABLE CHEST - 1 VIEW COMPARISON:  05/08/2021 FINDINGS: Interval progression of extensive bilateral asymmetric airspace opacities most confluent in the right mid and upper lung, and left lower lung, with enlarging focal opacity centrally in the left upper lung and worsening interstitial and airspace opacities at the right lung base. Heart size upper limits normal for technique. Previous AVR. CABG markers. Blunting of lateral costophrenic angles suggesting effusions. No pneumothorax. Sternotomy wires. IMPRESSION: 1. Worsening asymmetric extensive airspace infiltrates or edema. 2. Suspect small pleural effusions. Electronically Signed   By: Lucrezia Europe M.D.   On: 06/07/2021 07:51   DG Chest Port 1 View  Result Date: 05/27/2021 CLINICAL DATA:  Pt to ED via [facility] from home for shortness of breath. Pt speaking in complete sentences stating that she cannot breath. EXAM: PORTABLE CHEST 1 VIEW COMPARISON:  05/21/2021 FINDINGS: Stable changes from prior cardiac surgery and aortic valve replacement. Cardiac silhouette is enlarged. No mediastinal or hilar masses. Bilateral interstitial and patchy hazy airspace lung opacities. Interstitial opacities are similar to the prior exam, airspace opacities appear new. No convincing  pleural effusion.  No pneumothorax. Skeletal structures are grossly intact. IMPRESSION: 1. Interstitial and hazy airspace lung opacities. Congestive heart failure with pulmonary edema suspected. Multifocal pneumonia is also in the differential diagnosis. Electronically Signed   By: Lajean Manes M.D.   On: 06/02/2021 18:42   DG Chest Portable 1 View  Result Date: 05/21/2021 CLINICAL DATA:  Shortness of breath and heart racing. EXAM: PORTABLE CHEST 1 VIEW COMPARISON:  April 30, 2021 FINDINGS: Multiple sternal wires are noted. Stable, diffuse, chronic appearing increased interstitial lung markings are seen. There is no evidence of focal consolidation, pleural effusion or pneumothorax. The cardiac silhouette is enlarged and unchanged in size. An artificial aortic valve is seen. The visualized skeletal structures are unremarkable. IMPRESSION: Stable cardiomegaly and evidence of prior median sternotomy/CABG, without acute cardiopulmonary disease. Electronically Signed   By: Virgina Norfolk M.D.   On: 05/21/2021 02:40   DG Knee Complete 4 Views Left  Result Date: 05/27/2021 CLINICAL DATA:  Pain, swelling, bruising.  Fall. EXAM: LEFT KNEE - COMPLETE 4+ VIEW COMPARISON:  None. FINDINGS: Anterior soft tissue swelling. No acute bony abnormality. Specifically, no fracture, subluxation, or dislocation. No joint effusion. IMPRESSION: Anterior soft tissue swelling.  No acute bony abnormality. Electronically Signed   By: Rolm Baptise M.D.   On: 05/27/2021 05:28   ECHOCARDIOGRAM COMPLETE  Result Date: 06/04/2021    ECHOCARDIOGRAM REPORT   Patient Name:   [name] Date of Exam: 06/04/2021 Medical Rec #:  [id number]     Height:       66.0 in Accession #:    [id number]    Weight:       208.8 lb Date of Birth:  [birth year]      BSA:          2.037 m Patient Age:    63 years      BP:           151/77 mmHg Patient Gender: F             HR:           86 bpm. Exam Location:  [facility] Procedure: 2D Echo and Intracardiac  Opacification Agent Indications:  Dyspnea R06.00  History:         Patient has prior history of Echocardiogram examinations, most                  recent 05/17/2021.                  Aortic Valve: bioprosthetic valve is present in the aortic                  position.  Sonographer:     Kathlen Brunswick RDCS Referring Phys:  [id number] AMY N COX Diagnosing Phys: Kate Sable MD  Sonographer Comments: Technically challenging study due to limited acoustic windows, Technically difficult study due to poor echo windows and no apical window. Image acquisition challenging due to patient body habitus. IMPRESSIONS  1. Left ventricular ejection fraction, by estimation, is 55 to 60%. The left ventricle has normal function. Left ventricular endocardial border not optimally defined to evaluate regional wall motion. There is moderate left ventricular hypertrophy. Left ventricular diastolic parameters are consistent with Grade II diastolic dysfunction (pseudonormalization).  2. Right ventricular systolic function is normal. The right ventricular size is not well visualized.  3. Left atrial size was mildly dilated.  4. A small pericardial effusion is present. The pericardial effusion is posterior and lateral to the left ventricle. There is no evidence of cardiac tamponade.  5. The mitral valve is degenerative. Trivial mitral valve regurgitation. Mild to moderate mitral stenosis. The mean mitral valve gradient is 8.0 mmHg.  6. The aortic valve has been repaired/replaced. Aortic valve regurgitation is not visualized. There is a bioprosthetic valve present in the aortic position.  7. Aortic dilatation noted. There is mild dilatation of the ascending aorta, measuring 40 mm. FINDINGS  Left Ventricle: Left ventricular ejection fraction, by estimation, is 55 to 60%. The left ventricle has normal function. Left ventricular endocardial border not optimally defined to evaluate regional wall motion. Definity contrast agent was given IV to  delineate the left ventricular endocardial borders. The left ventricular internal cavity size was normal in size. There is moderate left ventricular hypertrophy. Left ventricular diastolic parameters are consistent with Grade II diastolic dysfunction (pseudonormalization). Right Ventricle: The right ventricular size is not well visualized. No increase in right ventricular wall thickness. Right ventricular systolic function is normal. Left Atrium: Left atrial size was mildly dilated. Right Atrium: Right atrial size was not well visualized. Pericardium: A small pericardial effusion is present. The pericardial effusion is posterior and lateral to the left ventricle. There is no evidence of cardiac tamponade. Mitral Valve: The mitral valve is degenerative in appearance. Mild to moderate mitral annular calcification. Trivial mitral valve regurgitation. Mild to moderate mitral valve stenosis. MV peak gradient, 14.3 mmHg. The mean mitral valve gradient is 8.0 mmHg. Tricuspid Valve: The tricuspid valve is normal in structure. Tricuspid valve regurgitation is mild. Aortic Valve: The aortic valve has been repaired/replaced. Aortic valve regurgitation is not visualized. Aortic valve mean gradient measures 2.0 mmHg. Aortic valve peak gradient measures 4.1 mmHg. Aortic valve area, by VTI measures 4.24 cm. There is a bioprosthetic valve present in the aortic position. Pulmonic Valve: The pulmonic valve was normal in structure. Pulmonic valve regurgitation is mild. Aorta: Aortic dilatation noted. There is mild dilatation of the ascending aorta, measuring 40 mm. Venous: The inferior vena cava was not well visualized. IAS/Shunts: No atrial level shunt detected by color flow Doppler.  LEFT VENTRICLE PLAX 2D LVIDd:         4.55 cm  Diastology LVIDs:         3.15 cm      LV e' medial:    5.11 cm/s LV PW:         1.65 cm      LV E/e' medial:  31.3 LV IVS:        1.75 cm      LV e' lateral:   7.51 cm/s LVOT diam:     1.70 cm      LV  E/e' lateral: 21.3 LV SV:         54 LV SV Index:   27 LVOT Area:     2.27 cm  LV Volumes (MOD) LV vol d, MOD A2C: 84.3 ml LV vol d, MOD A4C: 123.0 ml LV vol s, MOD A2C: 24.4 ml LV vol s, MOD A4C: 31.7 ml LV SV MOD A2C:     59.9 ml LV SV MOD A4C:     123.0 ml LV SV MOD BP:      79.1 ml LEFT ATRIUM           Index LA diam:      4.70 cm 2.31 cm/m LA Vol (A4C): 74.9 ml 36.77 ml/m  AORTIC VALVE                    PULMONIC VALVE AV Area (Vmax):    2.87 cm     PV Vmax:          1.32 m/s AV Area (Vmean):   2.86 cm     PV Peak grad:     7.0 mmHg AV Area (VTI):     4.24 cm     PR End Diast Vel: 6.66 msec AV Vmax:           101.35 cm/s AV Vmean:          64.200 cm/s AV VTI:            0.128 m AV Peak Grad:      4.1 mmHg AV Mean Grad:      2.0 mmHg LVOT Vmax:         128.00 cm/s LVOT Vmean:        80.900 cm/s LVOT VTI:          0.239 m LVOT/AV VTI ratio: 1.87  AORTA Ao Root diam: 2.90 cm Ao Asc diam:  4.00 cm MITRAL VALVE                TRICUSPID VALVE MV Area (PHT): 3.00 cm     TV Peak grad:   27.6 mmHg MV Area VTI:   1.02 cm     TV Vmax:        2.62 m/s MV Peak grad:  14.3 mmHg MV Mean grad:  8.0 mmHg     SHUNTS MV Vmax:       1.89 m/s     Systemic VTI:  0.24 m MV Vmean:      136.0 cm/s   Systemic Diam: 1.70 cm MV Decel Time: 253 msec MV E velocity: 160.00 cm/s MV A velocity: 173.00 cm/s MV E/A ratio:  0.92 Kate Sable MD Electronically signed by Kate Sable MD Signature Date/Time: 06/04/2021/3:30:30 PM    Final    ECHOCARDIOGRAM COMPLETE  Result Date: 05/17/2021    ECHOCARDIOGRAM REPORT   Patient Name:   [name] Date of Exam: 05/17/2021 Medical Rec #:  [id number]     Height:       65.0 in  Accession #:    [id number]    Weight:       215.0 lb Date of Birth:  1945/06/17      BSA:          2.040 m Patient Age:    21 years      BP:           130/58 mmHg Patient Gender: F             HR:           85 bpm. Exam Location:  [place] Procedure: 2D Echo, Cardiac Doppler and Color Doppler Indications:     Z95.2 Status post TAVR  History:        Patient has prior history of Echocardiogram examinations, most                 recent 04/19/2021. CAD, Status post TAVR-23 mm [place] S3U,                 COPD; Risk Factors:Sleep Apnea, Dyslipidemia, Diabetes and                 Hypertension.  Sonographer:    Cresenciano Lick RDCS Referring Phys: [phone] JILL D MCDANIEL IMPRESSIONS  1. Left ventricular ejection fraction, by estimation, is 55 to 60%. The left ventricle has normal function. The left ventricle has no regional wall motion abnormalities. There is moderate left ventricular hypertrophy. Left ventricular diastolic parameters are consistent with Grade I diastolic dysfunction (impaired relaxation).  2. Right ventricular systolic function is normal. The right ventricular size is normal. There is normal pulmonary artery systolic pressure. The estimated right ventricular systolic pressure is 06.3 mmHg.  3. Left atrial size was mildly dilated.  4. Right atrial size was mildly dilated.  5. The mitral valve is degenerative. No evidence of mitral valve regurgitation. Moderate to severe mitral stenosis. The mean mitral valve gradient is 9.0 mmHg with MVA by VTI 1.12 cm^2. Moderate mitral annular calcification with extensive valvular calcification.  6. Bioprosthetic aortic valve s/p TAVR. 23 mm [place] Sapien THV. No perivalvular leakage noted. Mean gradient 33 mmHg is significantly elevated, dimensionless index 0.31. Would suggest TEE or gated CTA to more closely evaluate bioprosthetic valve stenosis.  7. Aortic dilatation noted. There is mild dilatation of the ascending aorta, measuring 40 mm.  8. The inferior vena cava is normal in size with greater than 50% respiratory variability, suggesting right atrial pressure of 3 mmHg. FINDINGS  Left Ventricle: Left ventricular ejection fraction, by estimation, is 55 to 60%. The left ventricle has normal function. The left ventricle has no regional wall motion abnormalities. The  left ventricular internal cavity size was normal in size. There is  moderate left ventricular hypertrophy. Left ventricular diastolic parameters are consistent with Grade I diastolic dysfunction (impaired relaxation). Right Ventricle: The right ventricular size is normal. No increase in right ventricular wall thickness. Right ventricular systolic function is normal. There is normal pulmonary artery systolic pressure. The tricuspid regurgitant velocity is 2.55 m/s, and  with an assumed right atrial pressure of 3 mmHg, the estimated right ventricular systolic pressure is 01.6 mmHg. Left Atrium: Left atrial size was mildly dilated. Right Atrium: Right atrial size was mildly dilated. Pericardium: Trivial pericardial effusion is present. Mitral Valve: The mitral valve is degenerative in appearance. There is severe calcification of the mitral valve leaflet(s). Moderate mitral annular calcification. No evidence of mitral valve regurgitation. Moderate to severe mitral valve stenosis. MV peak gradient, 14.3 mmHg. The mean  mitral valve gradient is 9.0 mmHg. Tricuspid Valve: The tricuspid valve is normal in structure. Tricuspid valve regurgitation is trivial. Aortic Valve: Bioprosthetic aortic valve s/p TAVR. 23 mm [place]hn Sapien THV. No perivalvular leakage noted. Mean gradient 33 mmHg is significantly elevated, dimensionless index 0.31. Would suggest TEE or gated CTA to more closely evaluate bioprosthetic valve stenosis. The aortic valve has been repaired/replaced. Aortic valve regurgitation is not visualized. Aortic valve mean gradient measures 33.0 mmHg. Aortic valve peak gradient measures 34.5 mmHg. Aortic valve area, by VTI measures 1.02 cm. Pulmonic Valve: The pulmonic valve was normal in structure. Pulmonic valve regurgitation is not visualized. Aorta: Aortic dilatation noted. There is mild dilatation of the ascending aorta, measuring 40 mm. Venous: The inferior vena cava is normal in size with greater than 50%  respiratory variability, suggesting right atrial pressure of 3 mmHg. IAS/Shunts: No atrial level shunt detected by color flow Doppler.  LEFT VENTRICLE PLAX 2D LVIDd:         4.60 cm   Diastology LVIDs:         3.00 cm   LV e' medial:    6.09 cm/s LV PW:         1.20 cm   LV E/e' medial:  25.9 LV IVS:        1.20 cm   LV e' lateral:   7.62 cm/s LVOT diam:     1.90 cm   LV E/e' lateral: 20.7 LV SV:         57 LV SV Index:   28 LVOT Area:     2.84 cm  RIGHT VENTRICLE RV Basal diam:  4.00 cm RV S prime:     12.70 cm/s TAPSE (M-mode): 2.2 cm LEFT ATRIUM             Index        RIGHT ATRIUM           Index LA diam:        5.10 cm 2.50 cm/m   RA Area:     19.50 cm LA Vol (A2C):   86.4 ml 42.36 ml/m  RA Volume:   62.50 ml  30.64 ml/m LA Vol (A4C):   54.3 ml 26.62 ml/m LA Biplane Vol: 72.6 ml 35.59 ml/m  AORTIC VALVE AV Area (Vmax):    1.02 cm AV Area (Vmean):   0.95 cm AV Area (VTI):     1.02 cm AV Vmax:           293.80 cm/s AV Vmean:          218.200 cm/s AV VTI:            0.553 m AV Peak Grad:      34.5 mmHg AV Mean Grad:      33.0 mmHg LVOT Vmax:         105.50 cm/s LVOT Vmean:        73.000 cm/s LVOT VTI:          0.200 m LVOT/AV VTI ratio: 0.36  AORTA Ao Root diam: 3.50 cm Ao Asc diam:  4.00 cm MITRAL VALVE                TRICUSPID VALVE MV Area (PHT): 3.34 cm     TR Peak grad:   26.0 mmHg MV Area VTI:   1.12 cm     TR Vmax:        255.00 cm/s MV Peak grad:  14.3 mmHg MV Mean grad:  9.0 mmHg  SHUNTS MV Vmax:       1.89 m/s     Systemic VTI:  0.20 m MV Vmean:      145.5 cm/s   Systemic Diam: 1.90 cm MV VTI:        0.50 m MV Decel Time: 227 msec MV E velocity: 157.50 cm/s MV A velocity: 177.00 cm/s MV E/A ratio:  0.89 Dalton McleanMD Electronically signed by Franki Monte Signature Date/Time: 05/17/2021/6:15:59 PM    Final       ASSESSMENT/PLAN   Acute on chronic hypoxemic respiratory faiulre  -patient has severe multi vessel CAD based on cardiac cath few months ago  - acutely there is  evidence of pulmonary edema on CT chest.  -she had episode of respiratory distress and I spoke to RN who takes care of her who explained this was panic attack and patient admits to severe anxiety. She responded well to precedex and confirts DNR/DNI status -patient does have moderate pulmonary hypertension on RHC -CTPE is negative for PE -will consider sildenafil TID for pulmonary hypertension once more diruresed -vital were rechecked during my examination and were stable  Moderate pulmonary hypertension   - sildenafil 20 TID post diuresis  Acute pulmonary edema   -recheck TTE   Acute exacerbation of COPD with chronic hypoxemia   - continue iv solumedrol 20 IV daily   GI/DVT ppx  Electrolyte derrangements - pharmacy consult   Critical care provider statement:   Total critical care time: 33 minutes   Performed by: Lanney Gins MD   Critical care time was exclusive of separately billable procedures and treating other patients.   Critical care was necessary to treat or prevent imminent or life-threatening deterioration.   Critical care was time spent personally by me on the following activities: development of treatment plan with patient and/or surrogate as well as nursing, discussions with consultants, evaluation of patient's response to treatment, examination of patient, obtaining history from patient or surrogate, ordering and performing treatments and interventions, ordering and review of laboratory studies, ordering and review of radiographic studies, pulse oximetry and re-evaluation of patient's condition.    Ottie Glazier, M.D.  Pulmonary & Critical Care Medicine       Ottie Glazier, M.D.  Division of [place]

## 2021-06-09 NOTE — Evaluation (Signed)
Occupational Therapy Evaluation Patient Details Name: [name] MRN: [id number] DOB: 09/06/1945 Today's Date: 06/09/2021   History of Present Illness 76 y.o. 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 She presents to the ED for chief concerns of shortness of breath.   Clinical Impression   [name] was seen for OT evaluation this date. Prior to hospital admission, pt was Independent for limited household mobility and required assist for IADLs/dressing. Pt lives with spouse who provides care in home c 5 STE. Pt presents to acute OT demonstrating impaired ADL performance and functional mobility 2/2 decreased activity tolerance and functional strength/ROM/balance deficits. Pt anxious t/o session, repeatedly requesting a drink of water and stating "I can't breathe." SpO2 100% on 45L HFNC at rest, 90% in standing.   Pt currently requires CGA sup<>sit. SETUP + SBA don R sock seated EOB, MIN A don L sock 2/2 L knee edema/pain. MOD I grooming seated EOB. CGA + HHA for ADL t/f. MAX A perihygiene in standing. Pt would benefit from skilled OT to address noted impairments. Upon hospital discharge, recommend HHOT to maximize pt safety and return to PLOF.       Recommendations for follow up therapy are one component of a multi-disciplinary discharge planning process, led by the attending physician.  Recommendations may be updated based on patient status, additional functional  criteria and insurance authorization.   Follow Up Recommendations  Home health OT    Assistance Recommended at Discharge Frequent or constant Supervision/Assistance  Patient can return home with the following A little help with walking and/or transfers;A little help with bathing/dressing/bathroom;Direct supervision/assist for medications management;Help with stairs or ramp for entrance    Functional Status Assessment  Patient has had a recent decline in their functional status and demonstrates the ability to make significant improvements in function in a reasonable and predictable amount of time.  Equipment Recommendations  BSC/3in1;Hospital bed    Recommendations for Other Services       Precautions / Restrictions Precautions Precautions: Fall Restrictions Weight Bearing Restrictions: No      Mobility Bed Mobility Overal bed mobility: Needs Assistance Bed Mobility: Supine to Sit;Sit to Supine     Supine to sit: Min guard Sit to supine: Min guard        Transfers Overall transfer level: Needs assistance Equipment used: 1 person hand held assist Transfers: Sit to/from Stand;Bed to chair/wheelchair/BSC Sit to Stand: Min guard          Lateral/Scoot Transfers: Min guard General transfer comment: MAX encouragement but no physical assist for mobility      Balance Overall balance assessment: Needs assistance Sitting-balance support: Bilateral upper extremity supported;Feet supported Sitting balance-Leahy Scale: Good     Standing balance support: Bilateral upper extremity supported Standing balance-Leahy Scale: Fair                             ADL either performed or assessed with clinical judgement   ADL Overall ADL's : Needs assistance/impaired  General ADL Comments: SETUP + SBA don R sock seated EOB, MIN A don L sock 2/2 L knee edema/pain. MOD I grooming seated EOB. CGA + HHA for ADL t/f. MAX  A perihygiene in standing       Pertinent Vitals/Pain Pain Assessment: Faces Faces Pain Scale: Hurts a little bit Pain Location: L knee Pain Descriptors / Indicators: Discomfort;Dull Pain Intervention(s): Limited activity within patient's tolerance;Repositioned     Hand Dominance Right   Extremity/Trunk Assessment Upper Extremity Assessment Upper Extremity Assessment: Generalized weakness   Lower Extremity Assessment Lower Extremity Assessment: Generalized weakness       Communication Communication Communication: HOH   Cognition Arousal/Alertness: Awake/alert Behavior During Therapy: WFL for tasks assessed/performed;Anxious Overall Cognitive Status: Within Functional Limits for tasks assessed                                 General Comments: A&Ox3; not oriented to date; anxious throughout, especially with SOB/mobility     General Comments  SpO2 90-100% on 45L HFNC     Home Living Family/patient expects to be discharged to:: Private residence Living Arrangements: Spouse/significant other Available Help at Discharge: Family;Available 24 hours/day (husband is mobile with his prosthetic; is primary caregiver to pt) Type of Home: House Home Access: Stairs to enter [employer] of Steps: 5 Entrance Stairs-Rails: Left Home Layout: One level     Bathroom Shower/Tub: Occupational psychologist: Standard Bathroom Accessibility: No   Home Equipment: Shower seat;Hand held shower head;Grab bars - tub/shower;Rolling Environmental consultant (2 wheels);BSC/3in1   Additional Comments: Uses O2 at baseline (3L)      Prior Functioning/Environment Prior Level of Function : Needs assist       Physical Assist : Mobility (physical);ADLs (physical) Mobility (physical): Stairs;Gait ADLs (physical): Bathing;IADLs Mobility Comments: very limited ambulation distances; does not use AD, states she moves very slowly ADLs Comments: husband present for showers; assist  with all IADLs        OT Problem List: Decreased strength;Decreased range of motion;Decreased activity tolerance;Impaired balance (sitting and/or standing);Decreased safety awareness;Cardiopulmonary status limiting activity      OT Treatment/Interventions: Self-care/ADL training;Therapeutic exercise;Energy conservation;DME and/or AE instruction;Therapeutic activities;Patient/family education;Balance training    OT Goals(Current goals can be found in the care plan section) Acute Rehab OT Goals Patient Stated Goal: to have a drink OT Goal Formulation: With patient/family Time For Goal Achievement: 06/23/21 Potential to Achieve Goals: Good ADL Goals Pt Will Perform Grooming: with modified independence;standing (c LRAD PRN) Pt Will Perform Lower Body Dressing: sit to/from stand;with min guard assist (c LRAD PRN) Pt Will Transfer to Toilet: with supervision;ambulating;regular height toilet (c LRAD PRN)  OT Frequency: Min 2X/week       AM-PAC OT "6 Clicks" Daily Activity     Outcome Measure Help from another person eating meals?: None Help from another person taking care of personal grooming?: A Little Help from another person toileting, which includes using toliet, bedpan, or urinal?: A Lot Help from another person bathing (including washing, rinsing, drying)?: A Little Help from another person to put on and taking off regular upper body clothing?: A Little Help from another person to put on and taking off regular lower body clothing?: A Little 6 Click Score: 18   End of Session Equipment Utilized During Treatment: Oxygen Nurse Communication: Mobility status  Activity Tolerance: Patient tolerated treatment well Patient left: in bed;with call bell/phone within reach;with nursing/sitter in room;with family/visitor present  OT Visit Diagnosis: Unsteadiness on feet (R26.81)                Time: 1540-1600 OT Time Calculation (min): 20 min Charges:  OT General Charges $OT Visit: 1  Visit OT Evaluation $OT Eval Moderate Complexity: 1 Mod OT Treatments $Self Care/Home Management : 8-22 mins  Dessie Coma, M.S. OTR/L  06/09/21, 4:47 PM  ascom [phone]

## 2021-06-09 NOTE — Progress Notes (Signed)
PROGRESS NOTE    AMMY LIENHARD  LFY:[id number] DOB: [birth year] DOA: 05/08/2021 PCP: Rusty Aus, MD   Brief Narrative:  [name] is a 76 y.o. female who presents from home with medical history significant for aortic dilatation, history of aortic valve repair, history of bioprosthetic aortic valve replacement with severe AS and resulting in TAVR on 04/20/2021, COPD on room air at baseline, former tobacco user, chronic anxiety/depression with frequent panic attacks, hypertension, obstructive sleep apnea, iron deficiency anemia, CAD status post CABG in 2015 (LIMA to LAD, SVG to RCA, and 21 mm [employer]) and status post PCI in June 2022, anemia of chronic disease who presented to [facility] ED on 05/21/2021 with worsening malaise and generalized weakness.  Associated with dyspnea on exertion and at rest.  Work-up revealed severe acute hypoxic respiratory failure secondary to community-acquired pneumonia and COPD exacerbation.  CTA was negative for PE.  She was started on IV antibiotics empirically, IV steroids, pulmonary toilet, and bronchodilators.  Hospital course complicated by worsening respiratory distress on 06/07/2021 for which she was transferred to stepdown unit and was placed on BiPAP continuously.  Due to restlessness and agitation delirium, she was started on Precedex drip.  Briefly received vasopressor Neo-Synephrine to maintain her MAP greater than 65, this was held on 06/08/2021.  She was seen by speech therapist on 06/08/2021 at that time recommendation was for n.p.o. after reassessment on 06/09/2021 she was started on dysphagia 1 nectar thick fluid by speech therapist.  06/09/2021: Seen and examined at her bedside.  Her son and her husband were present in the room.  Very anxious even on Precedex drip, intermittently requiring benzodiazepine.  Patient on Ativan prior to admission for chronic anxiety and recurrent panic attacks.   Assessment & Plan:   Persistent acute hypoxic  respiratory failure, multifactorial, POA Secondary to multifocal pneumonia, COPD exacerbation  -Seen by cardiology given recent TAVR  -Echocardiogram - appropriate TAVR and LV function.  -Chest x-ray shows diffuse bilateral infiltrate concerning for viral etiology although COVID and flu remain negative.  IV antibiotics was broadened on 06/07/2021 due to respiratory decompensation.  Meropenem and linezolid were ordered.  These were switched to IV Zosyn.  Then this was switched to IV doxycycline on 06/08/2021.   RSV negative.   BNP elevated greater than 600  CTA negative for PE.  Minimal saturation greater than 90%. BiPAP nightly Heated high flow nasal cannula during the day to allow for breaks from BiPAP and to allow for feedings. Continue to monitor in stepdown unit  Agitation delirium on Precedex Did not respond to benzodiazepine Precedex was started on 06/07/2021 PCCM assisting with management of her agitation delirium while on Precedex drip. As needed Ativan for refractory agitation on Precedex  Transient bradycardia in the setting of Precedex infusion Suspect secondary to Precedex Continue to closely monitor in stepdown unit Curb sided with cardiology, transient bradycardia likely secondary to Precedex. 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 Continue to avoid nephrotoxic agents, and hypotension. Continue to monitor urine output closely  Worsening hyperphosphatemia in the setting of acute renal insufficiency Phosphorus 7.5 from 6.1, if no improvement may consider phosphorus binder PhosLo PhosLo 667 mg 3 times daily x3 doses Repeat renal panel in the morning  Dysphagia Seen by speech therapist, recommended dysphagia 1 with nectar  thick liquids. Continue aspiration precaution  Acute diastolic CHF Elevated BNP on presentation greater than 300 Increased pulmonary vascularity seen on chest x-ray, personally reviewed. Received doses of IV Lasix due to acute respiratory distress. Continue strict I's and O's and daily weight Replete electrolytes as indicated Closely monitor on stepdown unit. Repeat BNP on 06/10/2021.  Ambulatory dysfunction, frequent falls, left knee ecchymosis, POA -PT OT to follow once medically stable for ambulation -Left knee imaging unremarkable for fracture  Acute symptomatic anemia, ongoing/anemia of chronic disease -Baseline iron deficiency anemia with chronic anemia of chronic disease  FOBT negative -1 unit PRBC transfused on 06/06/2021, repeat hemoglobin 7.8. Hemoglobin 7.3 on 06/09/2021 Continue to monitor H&H No overt bleeding.  Insulin-dependent diabetes mellitus type 2 -Continue sliding scale insulin, hypoglycemic protocol  -Home insulin 100 units at bedtime, will continue as long as patient's appetite remains appropriate Hemoglobin A1c 6.2 on 04/14/2021. Avoid hypoglycemia  Depression, Generalized anxiety disorder, Anxiety - Resume home duloxetine 20 mg every morning, fluoxetine 40 mg twice daily; Judicious benzodiazepine in the setting of respiratory distress.  Insomnia-melatonin nightly as needed  Hypothyroid-continue home levothyroxine 125 mcg daily  GERD-continue PPI  Goals of care Palliative care team consulted to assist with establishing goals of care. DNR  Critical care time: 65 minutes.   DVT prophylaxis: Lovenox subcu daily. Code Status: DNR Family Communication: Updated her husband and son at bedside.  Status is: Inpatient  Dispo: The patient is from: Home              Anticipated d/c is to: To be determined -likely SNF given profound dyspnea with minimal exertion.              Anticipated d/c date is: 72+ hours              Patient currently not medically  stable for discharge  Consultants:  Cardiology Palliative care team. PCCM  Procedures:  None planned  Antimicrobials:  Azithromycin, ceftriaxone x5 days, completed on 06/07/2021. Meropenem, linezolid started on 06/07/2021.  Objective: Vitals:   06/09/21 1000 06/09/21 1130 06/09/21 1330 06/09/21 1400  BP: (!) 108/54 134/60 138/78 (!) 122/52  Pulse: 60 78 68 72  Resp: (!) 30 (!) 28  (!) 28  Temp:      TempSrc:      SpO2: 99% 92% 100% 100%  Weight:      Height:        Intake/Output Summary (Last 24 hours) at 06/09/2021 1444 Last data filed at 06/09/2021 1300 Gross per 24 hour  Intake 1871.95 ml  Output 1000 ml  Net 871.95 ml   Filed Weights   06/07/21 0747 06/08/21 0500 06/09/21 0440  Weight: 97.6 kg 96.2 kg 95.5 kg    Examination:  General: Obese in no acute distress.  Anxious.  Alert.  On heated high flow nasal cannula.   HEENT: Normocephalic/atraumatic Neck: Trachea is midline Lungs: Diffuse rales bilaterally.  Poor inspiratory effort.  No wheezing noted.   Heart: Regular rate and rhythm no rubs or gallops. Abdomen: Soft nontender normal bowel sounds present. Extremities: Trace lower extremity edema bilaterally.    Skin: Skin is warm and dry Psych: Mood is appropriate for condition and setting. Neuro: Moves all 4 extremities.  Nonfocal exam.   Data Reviewed: I have personally reviewed following labs and imaging studies  CBC: Recent Labs  Lab 06/04/21 0744 06/06/21 0550 06/07/21 0349 06/08/21 0607 06/09/21 0619  WBC 12.1* 14.3*  18.0* 13.1* 11.2*  NEUTROABS  --   --   --  11.2*  --   HGB 7.4* 6.9* 7.8* 7.2* 7.3*  HCT 25.0* 22.7* 24.4* 23.5* 24.0*  MCV 81.7 80.5 80.5 82.5 83.3  PLT 216 194 174 195 329   Basic Metabolic Panel: Recent Labs  Lab 06/04/21 0744 06/06/21 0550 06/07/21 0349 06/08/21 0607 06/09/21 0619  NA 134* 130* 132* 138 141  K 4.3 4.2 4.6 4.5 4.4  CL 96* 96* 98 99 104  CO2 26 26 27 27 26   GLUCOSE 195* 164* 250* 98 66*  BUN 29* 32*  32* 40* 57*  CREATININE 1.58* 1.58* 1.49* 1.72* 1.42*  CALCIUM 8.0* 7.7* 7.9* 8.1* 8.6*  MG  --   --   --  2.3  --   PHOS  --   --   --  6.1* 7.5*   GFR: Estimated Creatinine Clearance: 39.3 mL/min (A) (by C-G formula based on SCr of 1.42 mg/dL (H)). Liver Function Tests: Recent Labs  Lab 06/08/21 0607  AST 21  ALT 16  ALKPHOS 129*  BILITOT 0.9  PROT 6.2*  ALBUMIN 2.6*   No results for input(s): LIPASE, AMYLASE in the last 168 hours. No results for input(s): AMMONIA in the last 168 hours. Coagulation Profile: No results for input(s): INR, PROTIME in the last 168 hours. Cardiac Enzymes: No results for input(s): CKTOTAL, CKMB, CKMBINDEX, TROPONINI in the last 168 hours. BNP (last 3 results) No results for input(s): PROBNP in the last 8760 hours. HbA1C: No results for input(s): HGBA1C in the last 72 hours. CBG: Recent Labs  Lab 06/08/21 1708 06/08/21 2104 06/09/21 0735 06/09/21 0814 06/09/21 1138  GLUCAP 117* 94 47* 118* 246*   Lipid Profile: No results for input(s): CHOL, HDL, LDLCALC, TRIG, CHOLHDL, LDLDIRECT in the last 72 hours. Thyroid Function Tests: No results for input(s): TSH, T4TOTAL, FREET4, T3FREE, THYROIDAB in the last 72 hours. Anemia Panel: No results for input(s): VITAMINB12, FOLATE, FERRITIN, TIBC, IRON, RETICCTPCT in the last 72 hours. Sepsis Labs: Recent Labs  Lab 05/05/2021 1808 06/07/21 0843 06/08/21 0607 06/09/21 0619  PROCALCITON 0.17 1.09 2.14 1.46  LATICACIDVEN  --  1.6 0.8  --     Recent Results (from the past 240 hour(s))  Resp Panel by RT-PCR (Flu A&B, Covid) Nasopharyngeal Swab     Status: None   Collection Time: 06/04/21  8:32 AM   Specimen: Nasopharyngeal Swab; Nasopharyngeal(NP) swabs in vial transport medium  Result Value Ref Range Status   SARS Coronavirus 2 by RT PCR NEGATIVE NEGATIVE Final    Comment: (NOTE) SARS-CoV-2 target nucleic acids are NOT DETECTED.  The SARS-CoV-2 RNA is generally detectable in upper  respiratory specimens during the acute phase of infection. The lowest concentration of SARS-CoV-2 viral copies this assay can detect is 138 copies/mL. A negative result does not preclude SARS-Cov-2 infection and should not be used as the sole basis for treatment or other patient management decisions. A negative result may occur with  improper specimen collection/handling, submission of specimen other than nasopharyngeal swab, presence of viral mutation(s) within the areas targeted by this assay, and inadequate number of viral copies(<138 copies/mL). A negative result must be combined with clinical observations, patient history, and epidemiological information. The expected result is Negative.  Fact Sheet for Patients:  EntrepreneurPulse.com.au  Fact Sheet for Healthcare Providers:  IncredibleEmployment.be  This test is no t yet approved or cleared by the [place] FDA and  has been authorized for detection and/or diagnosis  of SARS-CoV-2 by FDA under an Emergency Use Authorization (EUA). This EUA will remain  in effect (meaning this test can be used) for the duration of the COVID-19 declaration under Section 564(b)(1) of the Act, 21 U.S.C.section 360bbb-3(b)(1), unless the authorization is terminated  or revoked sooner.       Influenza A by PCR NEGATIVE NEGATIVE Final   Influenza B by PCR NEGATIVE NEGATIVE Final    Comment: (NOTE) The Xpert Xpress SARS-CoV-2/FLU/RSV plus assay is intended as an aid in the diagnosis of influenza from Nasopharyngeal swab specimens and should not be used as a sole basis for treatment. Nasal washings and aspirates are unacceptable for Xpert Xpress SARS-CoV-2/FLU/RSV testing.  Fact Sheet for Patients: EntrepreneurPulse.com.au  Fact Sheet for Healthcare Providers: IncredibleEmployment.be  This test is not yet approved or cleared by the [place] FDA and has been  authorized for detection and/or diagnosis of SARS-CoV-2 by FDA under an Emergency Use Authorization (EUA). This EUA will remain in effect (meaning this test can be used) for the duration of the COVID-19 declaration under Section 564(b)(1) of the Act, 21 U.S.C. section 360bbb-3(b)(1), unless the authorization is terminated or revoked.  Performed at [facility], [address] [place], [place] [address]   MRSA Next Gen by PCR, Nasal     Status: None   Collection Time: 06/07/21  8:00 AM   Specimen: Nasal Mucosa; Nasal Swab  Result Value Ref Range Status   MRSA by PCR Next Gen NOT DETECTED NOT DETECTED Final    Comment: (NOTE) The GeneXpert MRSA Assay (FDA approved for NASAL specimens only), is one component of a comprehensive MRSA colonization surveillance program. It is not intended to diagnose MRSA infection nor to guide or monitor treatment for MRSA infections. Test performance is not FDA approved in patients less than 38 years old. Performed at [facility], [place], [place] [address]   Respiratory (~20 pathogens) panel by PCR     Status: None   Collection Time: 06/07/21  1:51 PM   Specimen: Nasopharyngeal Swab; Respiratory  Result Value Ref Range Status   Adenovirus NOT DETECTED NOT DETECTED Final   Coronavirus 229E NOT DETECTED NOT DETECTED Final    Comment: (NOTE) The Coronavirus on the Respiratory Panel, DOES NOT test for the novel  Coronavirus (2019 nCoV)    Coronavirus HKU1 NOT DETECTED NOT DETECTED Final   Coronavirus NL63 NOT DETECTED NOT DETECTED Final   Coronavirus OC43 NOT DETECTED NOT DETECTED Final   Metapneumovirus NOT DETECTED NOT DETECTED Final   Rhinovirus / Enterovirus NOT DETECTED NOT DETECTED Final   Influenza A NOT DETECTED NOT DETECTED Final   Influenza B NOT DETECTED NOT DETECTED Final   Parainfluenza Virus 1 NOT DETECTED NOT DETECTED Final   Parainfluenza Virus 2 NOT DETECTED NOT DETECTED Final   Parainfluenza  Virus 3 NOT DETECTED NOT DETECTED Final   Parainfluenza Virus 4 NOT DETECTED NOT DETECTED Final   Respiratory Syncytial Virus NOT DETECTED NOT DETECTED Final   Bordetella pertussis NOT DETECTED NOT DETECTED Final   Bordetella Parapertussis NOT DETECTED NOT DETECTED Final   Chlamydophila pneumoniae NOT DETECTED NOT DETECTED Final   Mycoplasma pneumoniae NOT DETECTED NOT DETECTED Final    Comment: Performed at [facility], [place]. [address], [place], [place] [address]     Radiology Studies: CT HEAD WO CONTRAST (5MM)  Result Date: 06/08/2021 CLINICAL DATA:  Pneumonia, complication suspected. Malaise, weakness EXAM: CT HEAD WITHOUT CONTRAST TECHNIQUE: Contiguous axial images were obtained  from the base of the skull through the vertex without intravenous contrast. COMPARISON:  Brain MRI 10/11/2016 FINDINGS: Brain: There is no evidence of acute intracranial hemorrhage, extra-axial fluid collection, or acute infarct. There is an area of encephalomalacia in the left temporoparietal region consistent with remote infarct, new since the prior brain MRI from 2018. There is an additional small remote lacunar infarct in the right cerebellar hemisphere. Background parenchymal volume is normal. The ventricles are normal in size. Scattered foci of hypodensity in the remainder of the subcortical and periventricular white matter likely reflects sequela of chronic white matter microangiopathy. A small focus of calcification along the falx likely corresponds to the meningioma seen on the prior brain MRI from 2018. There is no other solid mass lesion. There is no midline shift. Vascular: There is calcification of the bilateral cavernous ICAs. Skull: Normal. Negative for fracture or focal lesion. Sinuses/Orbits: The paranasal sinuses are clear. Bilateral lens implants are in place. The globes and orbits are otherwise unremarkable. Other: There is trace fluid in the mastoid air cells bilaterally. IMPRESSION: 1.  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 2. No evidence of acute intracranial pathology. 3. Trace bilateral mastoid effusions. Electronically Signed   By: Valetta Mole M.D.   On: 06/08/2021 14:57   CT CHEST WO CONTRAST  Result Date: 06/08/2021 CLINICAL DATA:  Malaise. Weakness. Dyspnea on exertion. Community-acquired pneumonia and COPD exacerbation. On IV antibiotics. Known aortic dilatation. Aortic valve repair. EXAM: CT CHEST WITHOUT CONTRAST TECHNIQUE: Multidetector CT imaging of the chest was performed following the standard protocol without IV contrast. COMPARISON:  Chest radiograph 1 day prior.  CTA chest 05/11/2021. FINDINGS: Moderate motion degradation. Cardiovascular: Aortic atherosclerosis. Tortuous thoracic aorta. Aortic valve repair. Moderate cardiomegaly, without pericardial effusion. Median sternotomy for CABG. Pulmonary artery enlargement, outflow tract 4.0 cm Mediastinum/Nodes: 1.9 cm right paratracheal node is similar to on the prior. 1.4 cm node within the azygoesophageal recess is unchanged. Hilar regions poorly evaluated without intravenous contrast. 12 mm prevascular node is unchanged. Lungs/Pleura: Trace left pleural fluid or thickening is similar. Significantly worsened aeration since 06/02/2021. Relatively diffuse ground-glass and less so airspace disease with areas of septal thickening. Relative sparing of the left apex and superior segment left lower lobe. No areas of extraalveolar air.  No pneumothorax. Upper Abdomen: Caudate and lateral segment left liver lobe enlargement. Normal imaged portions of the spleen, stomach, adrenal glands. Cholecystectomy. Musculoskeletal: No acute osseous abnormality. IMPRESSION: 1. Moderately motion degraded exam. 2. Progression of relatively diffuse airspace and ground-glass opacity. Given lack of significant pleural fluid, favor infection, including atypical etiologies.  Recommend clinical exclusion of COVID-19. Pulmonary edema felt less likely. 3. Similar thoracic adenopathy, favored to be reactive. 4. Pulmonary artery enlargement suggests pulmonary arterial hypertension. 5.  Aortic Atherosclerosis (ICD10-I70.0). 6. Suspicion of mild cirrhosis.  Correlate with risk factors. Electronically Signed   By: Abigail Miyamoto M.D.   On: 06/08/2021 14:56        Scheduled Meds:  azelastine  1 spray Each Nare BID   Chlorhexidine Gluconate Cloth  6 each Topical Daily   clopidogrel  75 mg Oral Daily   DULoxetine  20 mg Oral Q breakfast   enoxaparin (LOVENOX) injection  0.5 mg/kg Subcutaneous Q24H   feeding supplement (NEPRO CARB STEADY)  237 mL Oral BID BM   FLUoxetine  40 mg Oral BID   insulin aspart  0-15 Units Subcutaneous TID WC   insulin aspart  0-5 Units Subcutaneous QHS   insulin glargine-yfgn  50 Units Subcutaneous QHS   levothyroxine  125 mcg Oral Q0600   mouth rinse  15 mL Mouth Rinse q12n4p   methylPREDNISolone (SOLU-MEDROL) injection  20 mg Intravenous Daily   midazolam  1 mg Intravenous Once   [START ON 06/10/2021] multivitamin with minerals  1 tablet Oral Daily   pantoprazole (PROTONIX) IV  40 mg Intravenous Daily   Continuous Infusions:  sodium chloride Stopped (06/09/21 0938)   dexmedetomidine (PRECEDEX) IV infusion 0.8 mcg/kg/hr (06/09/21 1131)   doxycycline (VIBRAMYCIN) IV 125 mL/hr at 06/09/21 0939   norepinephrine (LEVOPHED) Adult infusion Stopped (06/07/21 2346)    LOS: 5 days   Time spent: 39min  Kayleen Memos, DO [facility]  If 7PM-7AM, please contact night-coverage [email or web address]  06/09/2021, 2:44 PM

## 2021-06-09 NOTE — Progress Notes (Addendum)
Palliative Care Progress Note, Assessment & Plan   Patient Name: [name]       Date: 06/09/2021 DOB: April 02, 1946  Age: 76 y.o. MRN#: [id number] Attending Physician: Kayleen Memos, DO Primary Care Physician: Rusty Aus, MD Admit Date: 05/08/2021  Reason for Consultation/Follow-up: Establishing goals of care  Subjective: Patient is sitting up in bed with HFNC in place.  She appears to be in no respiratory distress.  She is attempting to take sips of liquids from a cup.  No family at bedside.  She does not acknowledge my presence and keeps her eyes closed throughout our entire interaction.  HPI: 76 y.o. female  with past medical history of aortic valve repair, bioprosthetic aortic valve replacement failure with severe AS (resulting in TAVR on 04/20/2021), COPD (RA at baseline), former tobacco smoker, HTN, OSA, CAD s/p CABG (2015) and s/pPCI (June 2022) first-degree AV heart block, arthritis, chronic diastolic HF, CKD (stage III), diverticulitis, HOH, PAD, DM type II, thyroid disease, generalized anxiety disorder with panic attacks, depression, and dementia admitted on 05/25/2021 with failure to thrive.  Patient found to have bilateral diffuse pulmonary infiltrates.   On 1/4 in AM patient became anxious and had difficulty breathing.  Patient currently receiving IV Ativan and using BiPAP continuously with increased agitation and anxiety.  On 1/5 speech and language therapy attempted bedside swallow study and recommended strict NPO diet.   On 1/6 speech and language therapist advanced pt diet to dys 1 with nectar thick liquids. Pt remains on precedex gtt.   Summary of counseling/coordination of care: After reviewing the patient's chart, medical records, and epic notes, I assessed the patient at bedside.   No family present.  Patient was hyper focused on drinking liquids from cup.  She did not open her eyes or engage in conversation with me other than to have cup moved towards her mouth.  Discussed with nurse regarding patient's current state. Nursing conveys that patient can be calm and breathing regular rate but when ashe sees/hears family she then becomes anxious and agitated, resulting in increased WOB, RR, and desaturation. DIscussed with nursing that family has been educated to help maintain calm environment with minimal stimuli.   No family present at bedside today.   PMT will continue to monitor the patient throughout her hospitalization. I have met with the family several times this week. They are aware that PMT is available any time they request.  I am off service after today and will f/u with the patient on Monday. Please reach out to PMT providers listed on Amion should any urgent palliative needs arise over the weekend.   Code Status: DNR  Prognosis: Unable to determine  Discharge Planning: To Be Determined  Recommendations/Plan: Treat the treatable Continue monitoring for impact of anxiety/panic attacks on RR   Care plan was discussed with nursing, patient  Physical Exam Vitals and nursing note reviewed.  Constitutional:      General: She is not in acute distress.    Appearance: She is not toxic-appearing.  HENT:     Head: Normocephalic and atraumatic.  Cardiovascular:     Rate and Rhythm: Normal rate.  Pulmonary:     Effort: Pulmonary effort  is normal.  Musculoskeletal:     Comments: Generalized weakness            Palliative Assessment/Data: 30%    Total Time 15 minutes  Greater than 50%  of this time was spent counseling and coordinating care related to the above assessment and plan.  Thank you for allowing the Palliative Medicine Team to assist in the care of this patient.  [place] Ilsa Iha, FNP-BC Palliative Medicine Team Team Phone #  [phone]

## 2021-06-09 NOTE — Progress Notes (Signed)
Speech Language Pathology Treatment: Dysphagia  Patient Details Name: [name] MRN: [id number] DOB: 1945-12-30 Today's Date: 06/09/2021 Time: 3532-9924 SLP Time Calculation (min) (ACUTE ONLY): 24 min  Assessment / Plan / Recommendation Clinical Impression  Pt seen for ongoing dysphagia management. Pt was more alert today but remains confused and required maximal cues to slow rate of consumption and cease talking while consuming POs. To slow rate of consumption, pt required having cup/spoon removed from her hand and reassurance that food/liquid were not being removed. Pt's oral phase is c/b anterior lingual movement during swallow. While this is likely a chronic motor pattern, it is atypical and may place pt at increased risk of aspiration. Pt's pharyngeal phase is concerning for decreased airway protection when consuming ice chips via spoon as pt presents with immediate throat clears and coughing. However when consuming nectar thick liquids via spoon and cup, pt is free of overt s/s of aspiration. Pt's respiratory status remained stable throughout consumption.   Given pt's continued altered mentation, fixation on food/liquids leading to fast rate of consumption as well as pt continues on HFNC, recommend conservative diet of dysphagia 1 with nectar thick liquids via cup and medicine crushed in puree. NO ICE CHIPS or THIN LIQUIDS at this time.   ST will follow for more advanced trials.    HPI HPI: ARMELIA PENTON is a 76 y.o. 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  Associated with dyspnea on exertion and at rest.  Work-up revealed acute hypoxic respiratory failure secondary to community-acquired pneumonia and COPD exacerbation.  CTA was negative for PE.  She was started on IV antibiotics empirically, IV steroids, pulmonary toilet, and bronchodilators. Hospital course complicated by respiratory distress on 06/07/2021 for which she was transferred to stepdown unit and was placed on BiPAP continuously.  Due to restlessness and agitation delirium, she was started on Precedex drip.  Also received vasopressor to maintain her MAP greater than 65, this was held on 06/08/2021. Recent chest x-ray (06/07/2021) revealed Interval progression of extensive bilateral asymmetric airspace opacities most confluent in the right mid and upper lung, and left lower lung, with enlarging focal opacity centrally in the left upper lung and worsening interstitial and airspace opacities at the right lung base. Suspect small pleural effusions.      SLP Plan  Continue with current plan of care      Recommendations for follow up therapy are one component of a multi-disciplinary discharge planning process, led by the attending physician.  Recommendations may be updated based on patient status, additional functional criteria and insurance authorization.    Recommendations  Diet recommendations: Dysphagia 1 (puree);Nectar-thick liquid Liquids provided via: Cup Medication Administration: Crushed with puree Supervision: Full supervision/cueing for compensatory strategies Compensations: Minimize environmental distractions;Slow rate;Small sips/bites Postural Changes and/or Swallow Maneuvers: Seated upright 90 degrees;Upright 30-60 min after meal                Oral Care Recommendations: Oral care QID Follow Up  Recommendations:  (TBD) Assistance recommended at  discharge: Frequent or constant Supervision/Assistance SLP Visit Diagnosis: Dysphagia, unspecified (R13.10) Plan: Continue with current plan of care        Damacio Weisgerber B. Rutherford Nail M.S., CCC-SLP, [place] Office [phone]    Stormy Fabian  06/09/2021, 8:47 AM

## 2021-06-10 LAB — BASIC METABOLIC PANEL
Anion gap: 8 (ref 5–15)
BUN: 51 mg/dL — ABNORMAL HIGH (ref 8–23)
CO2: 27 mmol/L (ref 22–32)
Calcium: 8.5 mg/dL — ABNORMAL LOW (ref 8.9–10.3)
Chloride: 103 mmol/L (ref 98–111)
Creatinine, Ser: 1.31 mg/dL — ABNORMAL HIGH (ref 0.44–1.00)
GFR, Estimated: 42 mL/min — ABNORMAL LOW (ref >60)
Glucose, Bld: 124 mg/dL — ABNORMAL HIGH (ref 70–99)
Potassium: 4.2 mmol/L (ref 3.5–5.1)
Sodium: 138 mmol/L (ref 135–145)

## 2021-06-10 LAB — BRAIN NATRIURETIC PEPTIDE: B Natriuretic Peptide: 827.3 pg/mL — ABNORMAL HIGH (ref 0.0–100.0)

## 2021-06-10 LAB — MAGNESIUM: Magnesium: 2.2 mg/dL (ref 1.7–2.4)

## 2021-06-10 LAB — CBC
HCT: 27.2 % — ABNORMAL LOW (ref 36.0–46.0)
Hemoglobin: 8.3 g/dL — ABNORMAL LOW (ref 12.0–15.0)
MCH: 25.2 pg — ABNORMAL LOW (ref 26.0–34.0)
MCHC: 30.5 g/dL (ref 30.0–36.0)
MCV: 82.7 fL (ref 80.0–100.0)
Platelets: 223 10*3/uL (ref 150–400)
RBC: 3.29 MIL/uL — ABNORMAL LOW (ref 3.87–5.11)
RDW: 16 % — ABNORMAL HIGH (ref 11.5–15.5)
WBC: 15.7 10*3/uL — ABNORMAL HIGH (ref 4.0–10.5)
nRBC: 0.1 % (ref 0.0–0.2)

## 2021-06-10 LAB — GLUCOSE, CAPILLARY
Glucose-Capillary: 119 mg/dL — ABNORMAL HIGH (ref 70–99)
Glucose-Capillary: 93 mg/dL (ref 70–99)
Glucose-Capillary: 109 mg/dL — ABNORMAL HIGH (ref 70–99)
Glucose-Capillary: 149 mg/dL — ABNORMAL HIGH (ref 70–99)
Glucose-Capillary: 169 mg/dL — ABNORMAL HIGH (ref 70–99)

## 2021-06-10 LAB — PHOSPHORUS: Phosphorus: 4.9 mg/dL — ABNORMAL HIGH (ref 2.5–4.6)

## 2021-06-10 MED ORDER — MORPHINE SULFATE (PF) 2 MG/ML IV SOLN
2.0000 mg | INTRAVENOUS | Status: DC | PRN
  Administered 2021-06-10 – 2021-06-12 (×11): 2 mg via INTRAVENOUS
  Filled 2021-06-10 (×12): qty 1

## 2021-06-10 MED ORDER — FLUOXETINE HCL 20 MG PO CAPS
20.0000 mg | ORAL_CAPSULE | Freq: Two times a day (BID) | ORAL | Status: DC
Start: 2021-06-10 — End: 2021-06-12
  Administered 2021-06-10: 20 mg via ORAL
  Filled 2021-06-10 (×5): qty 1

## 2021-06-10 MED ORDER — MORPHINE SULFATE (PF) 2 MG/ML IV SOLN
2.0000 mg | INTRAVENOUS | Status: DC | PRN
  Administered 2021-06-10 (×3): 2 mg via INTRAVENOUS
  Filled 2021-06-10 (×4): qty 1

## 2021-06-10 MED ORDER — MORPHINE SULFATE (PF) 2 MG/ML IV SOLN
INTRAVENOUS | Status: AC
  Administered 2021-06-10: 2 mg via INTRAVENOUS
  Filled 2021-06-10: qty 1

## 2021-06-10 NOTE — Progress Notes (Signed)
PROGRESS NOTE    [name]  FBP:[id number] DOB: [birth year] DOA: 06/01/2021 PCP: Rusty Aus, MD   Brief Narrative:  [name] is a 76 y.o. 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  Associated with dyspnea on exertion and at rest.  Work-up revealed severe acute hypoxic respiratory failure secondary to community-acquired pneumonia and COPD exacerbation.  CTA was negative for PE.  She was started on IV antibiotics empirically, IV steroids, pulmonary toilet, and bronchodilators.  Hospital course complicated by worsening respiratory distress on 06/07/2021 for which she was transferred to stepdown unit and was placed on BiPAP continuously.  Due to restlessness and agitation delirium, she was started on Precedex drip.  Briefly received vasopressor Neo-Synephrine to maintain her MAP greater than 65, this was held on 06/08/2021.  She was seen by speech therapist on 06/08/2021 at that time recommendation was for n.p.o. after reassessment on 06/09/2021 she was started on dysphagia 1 nectar thick fluid by speech therapist.  She has had uncontrolled anxiety and frequent panic attacks for which psych was consulted on 06/23/2021.  Recommendations appreciated.    06/09/2021: Seen and examined at her bedside while in stepdown unit.  Very anxious.  Assessment & Plan:   Persistent acute hypoxic and hypercarbic respiratory failure,  multifactorial, POA Secondary to multifocal pneumonia, COPD exacerbation  -Seen by cardiology given recent TAVR  -Echocardiogram - appropriate TAVR and LV function.  -Chest x-ray shows diffuse bilateral infiltrate concerning for viral etiology although COVID and flu remain negative.  IV antibiotics was broadened on 06/07/2021 due to respiratory decompensation.  Meropenem and linezolid were ordered.  These were switched to IV Zosyn.  Then this was switched to IV doxycycline on 06/08/2021.   RSV negative.   BNP elevated greater than 600  CTA negative for PE.  Maintain oxygen saturation greater than 90%. BiPAP nightly Heated high flow nasal cannula during the day to allow for breaks from BiPAP and to allow for feedings. Continue to monitor in stepdown unit Avoid concurrent use of opiates and benzodiazepines to avoid worsening respiratory status.  Agitation delirium on Precedex Did not respond to benzodiazepine Precedex was started on 06/07/2021 PCCM assisting with management of her agitation delirium while on Precedex drip. As needed Ativan for refractory agitation on Precedex  Chronic uncontrolled anxiety with frequent panic attacks Prior to admission, on p.o. Ativan as needed for panic attacks Psychiatry consulted on 06/10/2021, appreciate recommendations  Transient bradycardia in the setting of Precedex infusion Suspect secondary to Precedex Continue to closely monitor in stepdown unit Curb sided with cardiology, transient bradycardia likely secondary to Precedex. 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 Continue to avoid nephrotoxic agents, and hypotension. Continue to monitor urine output closely  Improving hyperphosphatemia  in the setting of acute renal insufficiency Phosphorus 4.9 from 7.5 from 6.1, after 2 doses of PhosLo 667 mg. Continue to monitor.  Dysphagia Seen by speech therapist, recommended dysphagia 1 with nectar thick liquids. Continue aspiration precautions  Acute diastolic CHF Elevated BNP on presentation greater than 300 Increased pulmonary vascularity seen on chest x-ray, personally reviewed. Received doses of IV Lasix due to acute respiratory distress. Continue strict I's and O's and daily weight Replete electrolytes as indicated Closely monitor on stepdown unit. Repeat BNP on 06/10/2021.  Ambulatory dysfunction, frequent falls, left knee ecchymosis, POA -PT OT to follow once medically stable for ambulation -Left knee imaging unremarkable for fracture -Continue to monitor  Acute symptomatic anemia, ongoing/anemia of chronic disease -Baseline iron deficiency anemia with chronic anemia of chronic disease  FOBT negative -1 unit PRBC transfused on 06/06/2021, repeat hemoglobin 7.8. Hemoglobin 7.3 on 06/09/2021>> hemoglobin 8.3 on 06/10/2021. Continue to monitor H&H No overt bleeding.  Insulin-dependent diabetes mellitus type 2 -Continue sliding scale insulin, hypoglycemic protocol  -Home insulin 100 units at bedtime, will continue as long as patient's appetite remains appropriate Hemoglobin A1c 6.2 on 04/14/2021. Avoid hypoglycemia  Depression, Generalized anxiety disorder, frequent panic attacks Management per psychiatrist  Insomnia-melatonin nightly as needed  Hypothyroid-continue home levothyroxine 125 mcg daily  GERD-continue PPI  Goals of care Palliative care team consulted to assist with establishing goals of care. Very poor prognosis DNR  Critical care time: 65 minutes   DVT prophylaxis: Lovenox subcu daily. Code Status: DNR Family Communication: Updated her husband and son at bedside.  Status is: Inpatient  Dispo: The patient is from: Home              Anticipated  d/c is to: To be determined -likely SNF given profound dyspnea with minimal exertion.              Anticipated d/c date is: 72+ hours              Patient currently not medically stable for discharge  Consultants:  Cardiology Palliative care team. PCCM  Procedures:  None planned  Antimicrobials:  Azithromycin, ceftriaxone x5 days, completed on 06/07/2021. Meropenem, linezolid started on 06/07/2021.  Objective: Vitals:   06/10/21 0900 06/10/21 1000 06/10/21 1100 06/10/21 1200  BP:   (!) 196/90 108/60  Pulse: 66 68 93 72  Resp: (!) 32 (!) 30 (!) 29 (!) 30  Temp:      TempSrc:      SpO2: 97% 96% (!) 73% 90%  Weight:      Height:        Intake/Output Summary (Last 24 hours) at 06/10/2021 1319 Last data filed at 06/10/2021 1200 Gross per 24 hour  Intake 1242.21 ml  Output 800 ml  Net 442.21 ml   Filed Weights   06/08/21 0500 06/09/21 0440 06/10/21 0500  Weight: 96.2 kg 95.5 kg 95.5 kg    Examination:  General: Obese very anxious.  On heated high flow.  HEENT: Normocephalic atraumatic. Neck: Trachea is midline Lungs: Diffuse rales bilaterally.  Poor inspiratory effort.  Heart: Regular rate and rhythm no rubs or gallops.   Abdomen: Obese nontender bowel sounds present.   Extremities: Trace lower extremity edema bilaterally.  Skin: Left knee ecchymosis. Psych: Mood is very anxious. Neuro: Nonfocal exam.  Moves all 4 extremities.   Data Reviewed: I have personally reviewed following labs and imaging studies  CBC: Recent Labs  Lab 06/06/21 0550 06/07/21 0349 06/08/21 7353 06/09/21 2992 06/10/21 4268  WBC 14.3* 18.0* 13.1* 11.2* 15.7*  NEUTROABS  --   --  11.2*  --   --   HGB 6.9* 7.8* 7.2* 7.3* 8.3*  HCT 22.7* 24.4* 23.5* 24.0* 27.2*  MCV 80.5 80.5 82.5 83.3 82.7  PLT 194 174 195 180 353   Basic Metabolic Panel: Recent Labs  Lab 06/06/21 0550 06/07/21 0349 06/08/21 0607 06/09/21 0619 06/10/21 0514  NA 130* 132* 138 141 138  K 4.2 4.6 4.5 4.4 4.2  CL 96*  98 99 104 103  CO2 26 27 27 26 27   GLUCOSE 164* 250* 98 66* 124*  BUN 32* 32* 40* 57* 51*  CREATININE 1.58* 1.49* 1.72* 1.42* 1.31*  CALCIUM 7.7* 7.9* 8.1* 8.6* 8.5*  MG  --   --  2.3  --  2.2  PHOS  --   --  6.1* 7.5* 4.9*   GFR: Estimated Creatinine Clearance: 42.6 mL/min (A) (by C-G formula based on SCr of 1.31 mg/dL (H)). Liver Function Tests: Recent Labs  Lab 06/08/21 0607  AST 21  ALT 16  ALKPHOS 129*  BILITOT 0.9  PROT 6.2*  ALBUMIN 2.6*   No results for input(s): LIPASE, AMYLASE in the last 168 hours. No results for input(s): AMMONIA in the last 168 hours. Coagulation Profile: No results for input(s): INR, PROTIME in the last 168 hours. Cardiac Enzymes: No results for input(s): CKTOTAL, CKMB, CKMBINDEX, TROPONINI in the last 168 hours. BNP (last 3 results) No results for input(s): PROBNP in the last 8760 hours. HbA1C: No results for input(s): HGBA1C in the last 72 hours. CBG: Recent Labs  Lab 06/09/21 1533 06/09/21 2134 06/10/21 0435 06/10/21 0734 06/10/21 1159  GLUCAP 168* 190* 119* 93 109*   Lipid Profile: No results for input(s): CHOL, HDL, LDLCALC, TRIG, CHOLHDL, LDLDIRECT in the last 72 hours. Thyroid Function Tests: No results for input(s): TSH, T4TOTAL, FREET4, T3FREE, THYROIDAB in the last 72 hours. Anemia Panel: No results for input(s): VITAMINB12, FOLATE, FERRITIN, TIBC, IRON, RETICCTPCT in the last 72 hours. Sepsis Labs: Recent Labs  Lab 05/28/2021 1808 06/07/21 0843 06/08/21 0607 06/09/21 0619  PROCALCITON 0.17 1.09 2.14 1.46  LATICACIDVEN  --  1.6 0.8  --     Recent Results (from the past 240 hour(s))  Resp Panel by RT-PCR (Flu A&B, Covid) Nasopharyngeal Swab     Status: None   Collection Time: 06/04/21  8:32 AM   Specimen: Nasopharyngeal Swab; Nasopharyngeal(NP) swabs in vial transport medium  Result Value Ref Range Status   SARS Coronavirus 2 by RT PCR NEGATIVE NEGATIVE Final    Comment: (NOTE) SARS-CoV-2 target nucleic acids are  NOT DETECTED.  The SARS-CoV-2 RNA is generally detectable in upper respiratory specimens during the acute phase of infection. The lowest concentration of SARS-CoV-2 viral copies this assay can detect is 138 copies/mL. A negative result does not preclude SARS-Cov-2 infection and should not be used as the sole basis for treatment or other patient management decisions. A negative result may occur with  improper specimen collection/handling, submission of specimen other than nasopharyngeal swab, presence of viral mutation(s) within the areas targeted by this assay, and inadequate number of viral copies(<138 copies/mL). A negative result must be combined with clinical observations, patient history, and epidemiological information. The expected result is Negative.  Fact Sheet for Patients:  EntrepreneurPulse.com.au  Fact Sheet for Healthcare Providers:  IncredibleEmployment.be  This test is no t yet approved or cleared by the [place] FDA and  has been authorized for detection and/or diagnosis of  SARS-CoV-2 by FDA under an Emergency Use Authorization (EUA). This EUA will remain  in effect (meaning this test can be used) for the duration of the COVID-19 declaration under Section 564(b)(1) of the Act, 21 U.S.C.section 360bbb-3(b)(1), unless the authorization is terminated  or revoked sooner.       Influenza A by PCR NEGATIVE NEGATIVE Final   Influenza B by PCR NEGATIVE NEGATIVE Final    Comment: (NOTE) The Xpert Xpress SARS-CoV-2/FLU/RSV plus assay is intended as an aid in the diagnosis of influenza from Nasopharyngeal swab specimens and should not be used as a sole basis for treatment. Nasal washings and aspirates are unacceptable for Xpert Xpress SARS-CoV-2/FLU/RSV testing.  Fact Sheet for Patients: EntrepreneurPulse.com.au  Fact Sheet for Healthcare Providers: IncredibleEmployment.be  This test is not  yet approved or cleared by the [place] FDA and has been authorized for detection and/or diagnosis of SARS-CoV-2 by FDA under an Emergency Use Authorization (EUA). This EUA will remain in effect (meaning this test can be used) for the duration of the COVID-19 declaration under Section 564(b)(1) of the Act, 21 U.S.C. section 360bbb-3(b)(1), unless the authorization is terminated or revoked.  Performed at [facility], [address] [place], [place] [address]   MRSA Next Gen by PCR, Nasal     Status: None   Collection Time: 06/07/21  8:00 AM   Specimen: Nasal Mucosa; Nasal Swab  Result Value Ref Range Status   MRSA by PCR Next Gen NOT DETECTED NOT DETECTED Final    Comment: (NOTE) The GeneXpert MRSA Assay (FDA approved for NASAL specimens only), is one component of a comprehensive MRSA colonization surveillance program. It is not intended to diagnose MRSA infection nor to guide or monitor treatment for MRSA infections. Test performance is not FDA approved in patients less than 29 years old. Performed at [facility], [place], [place] [address]   Respiratory (~20 pathogens) panel by PCR     Status: None   Collection Time: 06/07/21  1:51 PM   Specimen: Nasopharyngeal Swab; Respiratory  Result Value Ref Range Status   Adenovirus NOT DETECTED NOT DETECTED Final   Coronavirus 229E NOT DETECTED NOT DETECTED Final    Comment: (NOTE) The Coronavirus on the Respiratory Panel, DOES NOT test for the novel  Coronavirus (2019 nCoV)    Coronavirus HKU1 NOT DETECTED NOT DETECTED Final   Coronavirus NL63 NOT DETECTED NOT DETECTED Final   Coronavirus OC43 NOT DETECTED NOT DETECTED Final   Metapneumovirus NOT DETECTED NOT DETECTED Final   Rhinovirus / Enterovirus NOT DETECTED NOT DETECTED Final   Influenza A NOT DETECTED NOT DETECTED Final   Influenza B NOT DETECTED NOT DETECTED Final   Parainfluenza Virus 1 NOT DETECTED NOT DETECTED Final    Parainfluenza Virus 2 NOT DETECTED NOT DETECTED Final   Parainfluenza Virus 3 NOT DETECTED NOT DETECTED Final   Parainfluenza Virus 4 NOT DETECTED NOT DETECTED Final   Respiratory Syncytial Virus NOT DETECTED NOT DETECTED Final   Bordetella pertussis NOT DETECTED NOT DETECTED Final   Bordetella Parapertussis NOT DETECTED NOT DETECTED Final   Chlamydophila pneumoniae NOT DETECTED NOT DETECTED Final   Mycoplasma pneumoniae NOT DETECTED NOT DETECTED Final    Comment: Performed at [facility], [place]. [address] [place], [place] [address]     Radiology Studies: CT HEAD WO CONTRAST (5MM)  Result Date: 06/08/2021 CLINICAL DATA:  Pneumonia, complication suspected. Malaise, weakness EXAM: CT HEAD WITHOUT CONTRAST TECHNIQUE: Contiguous axial images were obtained from  the base of the skull through the vertex without intravenous contrast. COMPARISON:  Brain MRI 10/11/2016 FINDINGS: Brain: There is no evidence of acute intracranial hemorrhage, extra-axial fluid collection, or acute infarct. There is an area of encephalomalacia in the left temporoparietal region consistent with remote infarct, new since the prior brain MRI from 2018. There is an additional small remote lacunar infarct in the right cerebellar hemisphere. Background parenchymal volume is normal. The ventricles are normal in size. Scattered foci of hypodensity in the remainder of the subcortical and periventricular white matter likely reflects sequela of chronic white matter microangiopathy. A small focus of calcification along the falx likely corresponds to the meningioma seen on the prior brain MRI from 2018. There is no other solid mass lesion. There is no midline shift. Vascular: There is calcification of the bilateral cavernous ICAs. Skull: Normal. Negative for fracture or focal lesion. Sinuses/Orbits: The paranasal sinuses are clear. Bilateral lens implants are in place. The globes and orbits are otherwise unremarkable. Other: There is trace  fluid in the mastoid air cells bilaterally. IMPRESSION: 1. 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 2. No evidence of acute intracranial pathology. 3. Trace bilateral mastoid effusions. Electronically Signed   By: Valetta Mole M.D.   On: 06/08/2021 14:57   CT CHEST WO CONTRAST  Result Date: 06/08/2021 CLINICAL DATA:  Malaise. Weakness. Dyspnea on exertion. Community-acquired pneumonia and COPD exacerbation. On IV antibiotics. Known aortic dilatation. Aortic valve repair. EXAM: CT CHEST WITHOUT CONTRAST TECHNIQUE: Multidetector CT imaging of the chest was performed following the standard protocol without IV contrast. COMPARISON:  Chest radiograph 1 day prior.  CTA chest 06/02/2021. FINDINGS: Moderate motion degradation. Cardiovascular: Aortic atherosclerosis. Tortuous thoracic aorta. Aortic valve repair. Moderate cardiomegaly, without pericardial effusion. Median sternotomy for CABG. Pulmonary artery enlargement, outflow tract 4.0 cm Mediastinum/Nodes: 1.9 cm right paratracheal node is similar to on the prior. 1.4 cm node within the azygoesophageal recess is unchanged. Hilar regions poorly evaluated without intravenous contrast. 12 mm prevascular node is unchanged. Lungs/Pleura: Trace left pleural fluid or thickening is similar. Significantly worsened aeration since 05/30/2021. Relatively diffuse ground-glass and less so airspace disease with areas of septal thickening. Relative sparing of the left apex and superior segment left lower lobe. No areas of extraalveolar air.  No pneumothorax. Upper Abdomen: Caudate and lateral segment left liver lobe enlargement. Normal imaged portions of the spleen, stomach, adrenal glands. Cholecystectomy. Musculoskeletal: No acute osseous abnormality. IMPRESSION: 1. Moderately motion degraded exam. 2. Progression of relatively diffuse airspace and ground-glass opacity. Given lack of significant  pleural fluid, favor infection, including atypical etiologies. Recommend clinical exclusion of COVID-19. Pulmonary edema felt less likely. 3. Similar thoracic adenopathy, favored to be reactive. 4. Pulmonary artery enlargement suggests pulmonary arterial hypertension. 5.  Aortic Atherosclerosis (ICD10-I70.0). 6. Suspicion of mild cirrhosis.  Correlate with risk factors. Electronically Signed   By: Abigail Miyamoto M.D.   On: 06/08/2021 14:56        Scheduled Meds:  azelastine  1 spray Each Nare BID   Chlorhexidine Gluconate Cloth  6 each Topical Daily   clopidogrel  75 mg Oral Daily   DULoxetine  20 mg Oral Q breakfast   enoxaparin (LOVENOX) injection  0.5 mg/kg Subcutaneous Q24H   feeding supplement (NEPRO CARB STEADY)  237 mL Oral BID BM   FLUoxetine  20 mg Oral BID   insulin aspart  0-15 Units Subcutaneous TID WC   insulin aspart  0-5  Units Subcutaneous QHS   insulin glargine-yfgn  50 Units Subcutaneous QHS   levothyroxine  125 mcg Oral Q0600   mouth rinse  15 mL Mouth Rinse q12n4p   methylPREDNISolone (SOLU-MEDROL) injection  20 mg Intravenous Daily   multivitamin with minerals  1 tablet Oral Daily   pantoprazole (PROTONIX) IV  40 mg Intravenous Daily   Continuous Infusions:  sodium chloride Stopped (06/09/21 0938)   dexmedetomidine (PRECEDEX) IV infusion 1.4 mcg/kg/hr (06/10/21 1200)   doxycycline (VIBRAMYCIN) IV 100 mg (06/10/21 1233)   norepinephrine (LEVOPHED) Adult infusion Stopped (06/07/21 2346)    LOS: 6 days   Time spent: 88min  Kayleen Memos, DO [facility]  If 7PM-7AM, please contact night-coverage [email or web address]  06/10/2021, 1:19 PM

## 2021-06-10 NOTE — Progress Notes (Signed)
CRITICAL CARE         Date: 06/10/2021,   MRN# [id number] [name] [birth year]     AdmissionWeight: 98.1 kg                 CurrentWeight: 95.5 kg   Referring physician: Dr Jimmye Norman   CHIEF COMPLAINT:   Severe acute on chronic hypoxemic respiratory failure   HISTORY OF PRESENT ILLNESS   76 yo with hx of CABG 2015 , depression,anxiety, hypertension, OSA, COPD, iron deficiency anemia, aortic dilatation, presents emergency department for cc of dyspnea and sob. She is chronically hypoxemic with 3L/min [place] requirement. Came in for acute onset of sob/doe was negative for PE on admission. She is on BIPAP in resp distress.She denies recent fever, nausea, vomiting, abdominal pain, dysuria, diarrhea, hematuria, blood in her stool, syncope, lost of consciousness. She reports chest pain and severe SOB.She has severe anxiety and recurrent panic attacks She reports no new cough. She is unsure if she has had weight gain or lost. She denies flu like illness. She denies LE edema. PCCM consult placed for further evaluation and management.   06/08/21- patient is improved, she did have few episodes of mild bradycardia 40s. I Met with son and husband today to review medical plan and prognosis. They have met with palliative care today.   06/09/21- patient had severe and recurrent anxiety.  She had multiple episodes of severe respiratory distress with O2 100% and only responding to anxiolytics and sedatives.   06/10/20-  I met with patient several times today she continues to ask for drugs to "put me out" due to severe dyspnea with COPD severe anxiety disorder. I have met with family today numerous times and examined patient with them.  We reviewed hosptial course and medical plan at length   PAST MEDICAL HISTORY   Past Medical History:  Diagnosis Date   1st degree AV block    ACE-inhibitor cough    Allergic rhinitis    Anemia    iron deficiency anemia   Anxiety    Aortic ectasia (HCC)    a.  CT abd in 12/2016 incidentally noted aortic atherosclerosis and infrarenal abdominal aortic ectasia measuring as large as 2.7 cm with recommendation to repeat US in 2023.   Aortic stenosis    a. 10/2013 s/p AVR; b. 04/2021 s/p TAVR.   Arthritis    Asthma    Cataract    Chronic depression    Chronic diastolic CHF (congestive heart failure) (HCC)    Chronic headache    CKD (chronic kidney disease), stage III (HCC)    COPD (chronic obstructive pulmonary disease) (HCC)    Coronary artery disease    a. DES to RCA and mid Cx 2009. b. CABG x 2 and bioprosthetic AVR May 2015. c. cutting balloon to prox Cx in 05/2016; d. 11/2020 PCI/DES to LCX and VG->RCA (prox and distal); e. 02/2021 Cath: LM nl, LAD 141mCTO, D1 min irregs, LCX 50p/m ISR, patent mid stent, RCA 20ost, 80p, 1053mRPDA nl, VG->dRCA patent p/d stents, LIMA->LAD patent.   Dementia ([place]   Diverticulitis of colon    Essential hypertension    GERD (gastroesophageal reflux disease)    Hearing loss    History of blood transfusion 2013   History of prosthetic aortic valve replacement    a. 10/2013 s/p bioprosthetic AVR; b. 04/2021 s/p TAVR w/ EdOletta Lamasapien 3 THV (2355m c. 05/2021 Echo: EF 55-60%, no PV leak, 37m36mmean gradient;  d. 06/2021 Echo: EF 55-60%, mod LVH. GrII DD. Nl RV size/fxn. Mildly dil LA. Small peric eff w/o tamponade. Triv MR. Mild-mod MS (mean grad 7mHg), Nl fxn'ing AoV - peak grad 4.178mg, mean grad 2.8m26m. AVA 4.24cm^2 (VTI).   HOH (hard of hearing)    Hypercholesterolemia    a. intolerance of statins and niaspan -   IDA (iron deficiency anemia) 02/03/2019   Mitral stenosis    a. 06/2021 Echo: Mild-mod MS (mean gradient 8mm28m   Mobitz type 1 second degree AV block    OSA (obstructive sleep apnea)    mild, intolerant of cpap   PAD (peripheral artery disease) (HCC)[place] a. atherosclerosis by CT abd 12/2016 in [place].   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 McAlSumner Boastr. BartCyndia Bent 05/2021 Echo: EF 55-60%, no PV leak, 33mm22mean gradient; c. 06/2021 Echo: EF 55-60%, mod LVH. GrII DD. Nl fxn'ing AoV - peak grad 4.1mmHg22mean grad 2.8mmHg.29mA 4.24cm^2 (VTI).   Statin intolerance    Thyroid disease    Type II diabetes mellitus (HCC)   [place]   Past Surgical History:  Procedure Laterality Date   ABDOMINAL HYSTERECTOMY     ABDOMINAL HYSTERECTOMY W/ PARTIAL VAGINACTOMY     AORTIC VALVE REPLACEMENT N/A 10/12/2013   Procedure: AORTIC VALVE REPLACEMENT (AVR);  Surgeon: Bryan KGaye PollackLocation: [facility] OR; [place]: Open Heart Surgery;  Laterality: N/A;   APPENDECTOMY  1964   BARTHOLIN GLAND CYST EXCISION     BLADDER SUSPENSION     BREAST BIOPSY Bilateral 09/11/2000   neg   BREAST BIOPSY Left 07/24/2010   neg   BREAST CYST EXCISION  1988   bilateral nonmalignant tumors, x3   CARDIAC CATHETERIZATION     CARDIAC CATHETERIZATION N/A 05/25/2016   Procedure: Coronary Balloon Angioplasty;  Surgeon: David WLeonie ManLocation: [facility];  Service: Cardiovascular;  Laterality: N/A;   CARDIAC CATHETERIZATION N/A 05/25/2016   Procedure: Coronary/Graft Angiography;  Surgeon: David WLeonie ManLocation: [facility];  Service: Cardiovascular;  Laterality: N/A;   CATARACT EXTRACTION W/ INTRAOCULAR LENS  IMPLANT, BILATERAL     CHOLECYSTECTOMY  2001   COLECTOMY     lap sigmoid   COLONOSCOPY  2014   polyps found, 2 clamped off.   CORONARY ANGIOPLASTY  10/29/2007   Prox RCA & Mid Cx.   CORONARY ARTERY BYPASS GRAFT N/A 10/12/2013   Procedure: CORONARY ARTERY BYPASS GRAFT TIMES TWO;  Surgeon: Bryan KGaye PollackLocation: [facility] OR; [place]: [place];  Laterality: N/A;   CORONARY/GRAFT ANGIOGRAPHY N/A 09/20/2017   Procedure: CORONARY/GRAFT ANGIOGRAPHY;  Surgeon: Cooper,Sherren MochaLocation: [facility];  Service:  Cardiovascular;  Laterality: N/A;   INTRAOPERATIVE TRANSTHORACIC ECHOCARDIOGRAM N/A 04/18/2021   Procedure: INTRAOPERATIVE TRANSTHORACIC ECHOCARDIOGRAM;  Surgeon: McAlhanBurnell BlanksLocation: [facility];  Service: Open Heart Surgery;  Laterality: N/A;   LEFT HEART CATHETERIZATION WITH CORONARY ANGIOGRAM N/A 10/09/2013   Procedure: LEFT HEART CATHETERIZATION WITH CORONARY ANGIOGRAM;  Surgeon: ChristoBurnell BlanksLocation: [facility];  Service: Cardiovascular;  Laterality: N/A;   RIGHT/LEFT HEART CATH AND CORONARY ANGIOGRAPHY N/A 11/03/2020   Procedure: RIGHT/LEFT HEART CATH AND CORONARY ANGIOGRAPHY;  Surgeon: KowalskCorey Skains  MD;  Location: [place] [facility];  Service: Cardiovascular;  Laterality: N/A;   RIGHT/LEFT HEART CATH AND CORONARY/GRAFT ANGIOGRAPHY N/A 02/10/2021   Procedure: RIGHT/LEFT HEART CATH AND CORONARY/GRAFT ANGIOGRAPHY;  Surgeon: Andrez Grime, MD;  Location: [place] [facility];  Service: Cardiovascular;  Laterality: N/A;   STERNAL WIRES REMOVAL N/A 04/13/2014   Procedure: STERNAL WIRES REMOVAL;  Surgeon: [name], MD;  Location: [place];  Service: Thoracic;  Laterality: N/A;   TEE WITHOUT CARDIOVERSION N/A 11/03/2020   Procedure: TRANSESOPHAGEAL ECHOCARDIOGRAM (TEE);  Surgeon: Corey Skains, MD;  Location: [facility];  Service: Cardiovascular;  Laterality: N/A;   TEE WITHOUT CARDIOVERSION N/A 02/13/2021   Procedure: TRANSESOPHAGEAL ECHOCARDIOGRAM (TEE);  Surgeon: Corey Skains, MD;  Location: [facility];  Service: Cardiovascular;  Laterality: N/A;   THYROIDECTOMY     TONSILLECTOMY     TRANSCATHETER AORTIC VALVE REPLACEMENT, TRANSFEMORAL N/A 04/18/2021   Procedure: TRANSCATHETER AORTIC VALVE REPLACEMENT, TRANSFEMORAL;  Surgeon: [name], MD;  Location: [place] [facility];  Service: Open Heart Surgery;  Laterality: N/A;   TUBAL LIGATION     VAGINAL DELIVERY     3   VISCERAL ARTERY INTERVENTION N/A 08/16/2016   Procedure:  Visceral Artery Intervention;  Surgeon: Algernon Huxley, MD;  Location: [place] [facility];  Service: Cardiovascular;  Laterality: N/A;     FAMILY HISTORY   Family History  Problem Relation Age of Onset   Breast cancer Mother 38   Hypertension Father    Mesothelioma Father    Asthma Father    Stroke Paternal Grandfather    Heart disease Other    Breast cancer Maternal Aunt    Breast cancer Paternal Aunt      SOCIAL HISTORY   Social History   Tobacco Use   Smoking status: Former    Packs/day: 0.50    Years: 30.00    Pack years: 15.00    Types: Cigarettes    Quit date: 10/02/2013    Years since quitting: 7.6   Smokeless tobacco: Never  Vaping Use   Vaping Use: Never used  Substance Use Topics   Alcohol use: No   Drug use: No     MEDICATIONS    Home Medication:    Current Medication:  Current Facility-Administered Medications:    0.9 %  sodium chloride infusion, 250 mL, Intravenous, Continuous, Shondrika Hoque, MD, Stopped at 06/09/21 0938   albuterol (PROVENTIL) (2.5 MG/3ML) 0.083% nebulizer solution 2.5 mg, 2.5 mg, Nebulization, Q6H PRN, Cox, Amy N, DO, 2.5 mg at 06/07/21 0204   azelastine (ASTELIN) 0.1 % nasal spray 1 spray, 1 spray, Each Nare, BID, Cox, Amy N, DO, 1 spray at 06/09/21 2145   benzonatate (TESSALON) capsule 200 mg, 200 mg, Oral, TID PRN, Cox, Amy N, DO, 200 mg at 06/07/21 0608   Chlorhexidine Gluconate Cloth 2 % PADS 6 each, 6 each, Topical, Daily, Kayleen Memos, DO, 6 each at 06/09/21 6256   clopidogrel (PLAVIX) tablet 75 mg, 75 mg, Oral, Daily, Cox, Amy N, DO, 75 mg at 06/10/21 1228   dexmedetomidine (PRECEDEX) 400 MCG/100ML (4 mcg/mL) infusion, 0.4-1.5 mcg/kg/hr, Intravenous, Titrated, Rust-Chester, Britton L, NP, Last Rate: 24.4 mL/hr at 06/10/21 1658, 1 mcg/kg/hr at 06/10/21 1658   doxycycline (VIBRAMYCIN) 100 mg in sodium chloride 0.9 % 250 mL IVPB, 100 mg, Intravenous, Q12H, Beers, Shanon Brow, RPH, Last Rate: 125 mL/hr at 06/10/21 1233, 100 mg  at 06/10/21 1233   DULoxetine (CYMBALTA) DR capsule 20 mg, 20  mg, Oral, Q breakfast, Cox, Amy N, DO, 20 mg at 06/10/21 0746   enoxaparin (LOVENOX) injection 50 mg, 0.5 mg/kg, Subcutaneous, Q24H, Hall, Carole N, DO, 50 mg at 06/09/21 2141   feeding supplement (NEPRO CARB STEADY) liquid 237 mL, 237 mL, Oral, BID BM, Hall, Carole N, DO, 237 mL at 06/09/21 1413   FLUoxetine (PROZAC) capsule 20 mg, 20 mg, Oral, BID, Lord, Asa Saunas, NP   food thickener (SIMPLYTHICK (HONEY/LEVEL 3/MODERATELY THICK)) 1 packet, 1 packet, Oral, PRN, [place] Crane, Carole N, DO   insulin aspart (novoLOG) injection 0-15 Units, 0-15 Units, Subcutaneous, TID WC, Cox, Amy N, DO, 3 Units at 06/09/21 1639   insulin aspart (novoLOG) injection 0-5 Units, 0-5 Units, Subcutaneous, QHS, Cox, Amy N, DO, 5 Units at 06/06/21 2307   insulin glargine-yfgn (SEMGLEE) injection 50 Units, 50 Units, Subcutaneous, QHS, Kayleen Memos, DO, 50 Units at 06/09/21 2141   levothyroxine (SYNTHROID) tablet 125 mcg, 125 mcg, Oral, Q0600, Cox, Amy N, DO, 125 mcg at 06/10/21 0739   LORazepam (ATIVAN) injection 0.5 mg, 0.5 mg, Intravenous, Q8H PRN, [place] Crane, Carole N, DO, 0.5 mg at 06/07/21 2041   LORazepam (ATIVAN) tablet 0.5 mg, 0.5 mg, Oral, Q6H PRN, Irene Pap N, DO, 0.5 mg at 06/10/21 6063   MEDLINE mouth rinse, 15 mL, Mouth Rinse, q12n4p, Ottie Glazier, MD, 15 mL at 06/09/21 1639   melatonin tablet 5 mg, 5 mg, Oral, QHS PRN, [place] Crane, Carole N, DO   methylPREDNISolone sodium succinate (SOLU-MEDROL) 40 mg/mL injection 20 mg, 20 mg, Intravenous, Daily, Ottie Glazier, MD, 20 mg at 06/10/21 1222   morphine 2 MG/ML injection 2 mg, 2 mg, Intravenous, Q2H PRN, Ottie Glazier, MD   multivitamin with minerals tablet 1 tablet, 1 tablet, Oral, Daily, Hall, Carole N, DO   nitroGLYCERIN (NITROSTAT) SL tablet 0.4 mg, 0.4 mg, Sublingual, Q5 min PRN, Cox, Amy N, DO   ondansetron (ZOFRAN) tablet 4 mg, 4 mg, Oral, Q6H PRN **OR** ondansetron (ZOFRAN) injection 4 mg, 4 mg,  Intravenous, Q6H PRN, Cox, Amy N, DO, 4 mg at 06/06/21 2007   pantoprazole (PROTONIX) injection 40 mg, 40 mg, Intravenous, Daily, Benita Gutter, RPH, 40 mg at 06/10/21 1221    ALLERGIES   Amitriptyline, Benadryl [diphenhydramine], Demerol [meperidine], Gabapentin, Mirtazapine, Olanzapine, Voltaren [diclofenac sodium], Zetia [ezetimibe], Ativan [lorazepam], Atorvastatin, Budesonide-formoterol fumarate, Bupropion hcl, Caffeine, Codeine sulfate, Lisinopril, Metformin, Mometasone furoate, Morphine sulfate, Other, Oxycodone-acetaminophen, Pioglitazone, Propoxyphene n-acetaminophen, Rosuvastatin, Shellfish allergy, Suvorexant, Ticagrelor, Tramadol, Venlafaxine, Xanax [alprazolam], Zolpidem tartrate, and Latex     REVIEW OF SYSTEMS    Review of Systems:  Gen:  Denies  fever, sweats, chills weigh loss  HEENT: Denies blurred vision, double vision, ear pain, eye pain, hearing loss, nose bleeds, sore throat Cardiac:  No dizziness, chest pain or heaviness, chest tightness,edema Resp:   Denies cough or sputum porduction, shortness of breath,wheezing, hemoptysis,  Gi: Denies swallowing difficulty, stomach pain, nausea or vomiting, diarrhea, constipation, bowel incontinence Gu:  Denies bladder incontinence, burning urine Ext:   Denies Joint pain, stiffness or swelling Skin: Denies  skin rash, easy bruising or bleeding or hives Endoc:  Denies polyuria, polydipsia , polyphagia or weight change Psych:   Denies depression, insomnia or hallucinations   Other:  All other systems negative   VS: BP 108/60    Pulse 72    Temp 98.3 F (36.8 C) (Oral)    Resp (!) 30    Ht '5\' 6"'  (1.676 m)    Wt 95.5 kg    SpO2  90%    BMI 33.98 kg/m      PHYSICAL EXAM    GENERAL:NAD, no fevers, chills, no weakness no fatigue HEAD: Normocephalic, atraumatic.  EYES: Pupils equal, round, reactive to light. Extraocular muscles intact. No scleral icterus.  MOUTH: Moist mucosal membrane. Dentition intact. No abscess  noted.  EAR, NOSE, THROAT: Clear without exudates. No external lesions.  NECK: Supple. No thyromegaly. No nodules. No JVD.  PULMONARY: BIPAP lung sounds CARDIOVASCULAR: S1 and S2. Regular rate and rhythm. No murmurs, rubs, or gallops. No edema. Pedal pulses 2+ bilaterally.  GASTROINTESTINAL: Soft, nontender, nondistended. No masses. Positive bowel sounds. No hepatosplenomegaly.  MUSCULOSKELETAL: No swelling, clubbing, or edema. Range of motion full in all extremities.  NEUROLOGIC: Cranial nerves II through XII are intact. No gross focal neurological deficits. Sensation intact. Reflexes intact.  SKIN: No ulceration, lesions, rashes, or cyanosis. Skin warm and dry. Turgor intact.  PSYCHIATRIC: severely anxious      IMAGING    CT HEAD WO CONTRAST (5MM)  Result Date: 06/08/2021 CLINICAL DATA:  Pneumonia, complication suspected. Malaise, weakness EXAM: CT HEAD WITHOUT CONTRAST TECHNIQUE: Contiguous axial images were obtained from the base of the skull through the vertex without intravenous contrast. COMPARISON:  Brain MRI 10/11/2016 FINDINGS: Brain: There is no evidence of acute intracranial hemorrhage, extra-axial fluid collection, or acute infarct. There is an area of encephalomalacia in the left temporoparietal region consistent with remote infarct, new since the prior brain MRI from 2018. There is an additional small remote lacunar infarct in the right cerebellar hemisphere. Background parenchymal volume is normal. The ventricles are normal in size. Scattered foci of hypodensity in the remainder of the subcortical and periventricular white matter likely reflects sequela of chronic white matter microangiopathy. A small focus of calcification along the falx likely corresponds to the meningioma seen on the prior brain MRI from 2018. There is no other solid mass lesion. There is no midline shift. Vascular: There is calcification of the bilateral cavernous ICAs. Skull: Normal. Negative for fracture or  focal lesion. Sinuses/Orbits: The paranasal sinuses are clear. Bilateral lens implants are in place. The globes and orbits are otherwise unremarkable. Other: There is trace fluid in the mastoid air cells bilaterally. IMPRESSION: 1. 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 2. No evidence of acute intracranial pathology. 3. Trace bilateral mastoid effusions. Electronically Signed   By: Valetta Mole M.D.   On: 06/08/2021 14:57   CT CHEST WO CONTRAST  Result Date: 06/08/2021 CLINICAL DATA:  Malaise. Weakness. Dyspnea on exertion. Community-acquired pneumonia and COPD exacerbation. On IV antibiotics. Known aortic dilatation. Aortic valve repair. EXAM: CT CHEST WITHOUT CONTRAST TECHNIQUE: Multidetector CT imaging of the chest was performed following the standard protocol without IV contrast. COMPARISON:  Chest radiograph 1 day prior.  CTA chest 05/29/2021. FINDINGS: Moderate motion degradation. Cardiovascular: Aortic atherosclerosis. Tortuous thoracic aorta. Aortic valve repair. Moderate cardiomegaly, without pericardial effusion. Median sternotomy for CABG. Pulmonary artery enlargement, outflow tract 4.0 cm Mediastinum/Nodes: 1.9 cm right paratracheal node is similar to on the prior. 1.4 cm node within the azygoesophageal recess is unchanged. Hilar regions poorly evaluated without intravenous contrast. 12 mm prevascular node is unchanged. Lungs/Pleura: Trace left pleural fluid or thickening is similar. Significantly worsened aeration since 05/04/2021. Relatively diffuse ground-glass and less so airspace disease with areas of septal thickening. Relative sparing of the left apex and superior segment left lower lobe. No areas of extraalveolar air.  No pneumothorax. Upper Abdomen:  Caudate and lateral segment left liver lobe enlargement. Normal imaged portions of the spleen, stomach, adrenal glands. Cholecystectomy. Musculoskeletal: No  acute osseous abnormality. IMPRESSION: 1. Moderately motion degraded exam. 2. Progression of relatively diffuse airspace and ground-glass opacity. Given lack of significant pleural fluid, favor infection, including atypical etiologies. Recommend clinical exclusion of COVID-19. Pulmonary edema felt less likely. 3. Similar thoracic adenopathy, favored to be reactive. 4. Pulmonary artery enlargement suggests pulmonary arterial hypertension. 5.  Aortic Atherosclerosis (ICD10-I70.0). 6. Suspicion of mild cirrhosis.  Correlate with risk factors. Electronically Signed   By: Abigail Miyamoto M.D.   On: 06/08/2021 14:56   CT Angio Chest Pulmonary Embolism (PE) W or WO Contrast  Result Date: 06/04/2021 CLINICAL DATA:  Pulmonary embolism (PE) suspected, positive D-dimer EXAM: CT ANGIOGRAPHY CHEST WITH CONTRAST TECHNIQUE: Multidetector CT imaging of the chest was performed using the standard protocol during bolus administration of intravenous contrast. Multiplanar CT image reconstructions and MIPs were obtained to evaluate the vascular anatomy. CONTRAST:  60m OMNIPAQUE IOHEXOL 350 MG/ML SOLN COMPARISON:  02/01/2021 FINDINGS: Cardiovascular: Adequate opacification of the pulmonary arterial tree. No intraluminal filling defect identified to suggest acute pulmonary embolism. Central pulmonary arteries are enlarged in keeping with changes of pulmonary arterial hypertension, unchanged. Transcatheter aortic valve replacement has been performed. Multi-vessel coronary artery stenting has been performed as well as coronary artery bypass grafting. Cardiomegaly is present, stable. No pericardial effusion. Extensive atheromatous plaque is seen within the thoracic aorta demonstrating mural irregularity and numerous shallow ulcer like projections. No aortic aneurysm. Mediastinum/Nodes: The thyroid gland is absent or atretic. The esophagus is unremarkable. There is interval enlargement of multiple pathologically enlarged lymph nodes within  the right paratracheal and prevascular lymph node groups which measure up to 19 mm in short axis diameter. Numerous shotty bilateral hilar lymph nodes are also identified. Lungs/Pleura: Extensive bilateral asymmetric ground-glass pulmonary infiltrate has developed, most in keeping with atypical infection in the acute setting. No pneumothorax or pleural effusion. No central obstructing lesion. Upper Abdomen: No acute abnormality. Musculoskeletal: No acute bone abnormality. No lytic or blastic bone lesion. Review of the MIP images confirms the above findings. IMPRESSION: No pulmonary embolism. Extensive multifocal pulmonary infiltrate most in keeping with atypical infection in the acute setting. Stable cardiomegaly. Aortic Atherosclerosis (ICD10-I70.0). Electronically Signed   By: AFidela SalisburyM.D.   On: 06/04/2021 00:04   [place]ous Img Lower Unilateral Left  Result Date: 05/25/2021 CLINICAL DATA:  Initial evaluation for acute swelling of left calf, evaluate for DVT. EXAM: Left LOWER EXTREMITY VENOUS DOPPLER ULTRASOUND TECHNIQUE: Gray-scale sonography with graded compression, as well as color Doppler and duplex ultrasound were performed to evaluate the lower extremity deep venous systems from the level of the common femoral vein and including the common femoral, femoral, profunda femoral, popliteal and calf veins including the posterior tibial, peroneal and gastrocnemius veins when visible. The superficial great saphenous vein was also interrogated. Spectral Doppler was utilized to evaluate flow at rest and with distal augmentation maneuvers in the common femoral, femoral and popliteal veins. COMPARISON:  None. FINDINGS: Contralateral Common Femoral Vein: Respiratory phasicity is normal and symmetric with the symptomatic side. No evidence of thrombus. Normal compressibility. Common Femoral Vein: No evidence of thrombus. Normal compressibility, respiratory phasicity and response to augmentation. Saphenofemoral  Junction: No evidence of thrombus. Normal compressibility and flow on color Doppler imaging. Profunda Femoral Vein: No evidence of thrombus. Normal compressibility and flow on color Doppler imaging. Femoral Vein: No evidence of thrombus. Normal compressibility, respiratory phasicity and response  to augmentation. Popliteal Vein: No evidence of thrombus. Normal compressibility, respiratory phasicity and response to augmentation. Calf Veins: No evidence of thrombus. Normal compressibility and flow on color Doppler imaging. Superficial Great Saphenous Vein: No evidence of thrombus. Normal compressibility. Venous Reflux:  None. Other Findings:  None. IMPRESSION: No evidence of deep venous thrombosis. Electronically Signed   By: Jeannine Boga M.D.   On: 05/16/2021 19:41   DG Chest Port 1 View  Result Date: 06/07/2021 CLINICAL DATA:  Reason for exam: SOB Hx of CHF, COPD, asthma, aortic valve repair, history of bioprosthetic aortic valve replacement failure with severe AS and resulting in TAVR on 04/20/2021, Rapid Response Nurse currently in room. EXAM: PORTABLE CHEST - 1 VIEW COMPARISON:  05/21/2021 FINDINGS: Interval progression of extensive bilateral asymmetric airspace opacities most confluent in the right mid and upper lung, and left lower lung, with enlarging focal opacity centrally in the left upper lung and worsening interstitial and airspace opacities at the right lung base. Heart size upper limits normal for technique. Previous AVR. CABG markers. Blunting of lateral costophrenic angles suggesting effusions. No pneumothorax. Sternotomy wires. IMPRESSION: 1. Worsening asymmetric extensive airspace infiltrates or edema. 2. Suspect small pleural effusions. Electronically Signed   By: Lucrezia Europe M.D.   On: 06/07/2021 07:51   DG Chest Port 1 View  Result Date: 05/21/2021 CLINICAL DATA:  Pt to ED via [facility] from home for shortness of breath. Pt speaking in complete sentences stating that she cannot breath.  EXAM: PORTABLE CHEST 1 VIEW COMPARISON:  05/21/2021 FINDINGS: Stable changes from prior cardiac surgery and aortic valve replacement. Cardiac silhouette is enlarged. No mediastinal or hilar masses. Bilateral interstitial and patchy hazy airspace lung opacities. Interstitial opacities are similar to the prior exam, airspace opacities appear new. No convincing pleural effusion.  No pneumothorax. Skeletal structures are grossly intact. IMPRESSION: 1. Interstitial and hazy airspace lung opacities. Congestive heart failure with pulmonary edema suspected. Multifocal pneumonia is also in the differential diagnosis. Electronically Signed   By: Lajean Manes M.D.   On: 05/14/2021 18:42   DG Chest Portable 1 View  Result Date: 05/21/2021 CLINICAL DATA:  Shortness of breath and heart racing. EXAM: PORTABLE CHEST 1 VIEW COMPARISON:  April 30, 2021 FINDINGS: Multiple sternal wires are noted. Stable, diffuse, chronic appearing increased interstitial lung markings are seen. There is no evidence of focal consolidation, pleural effusion or pneumothorax. The cardiac silhouette is enlarged and unchanged in size. An artificial aortic valve is seen. The visualized skeletal structures are unremarkable. IMPRESSION: Stable cardiomegaly and evidence of prior median sternotomy/CABG, without acute cardiopulmonary disease. Electronically Signed   By: Virgina Norfolk M.D.   On: 05/21/2021 02:40   DG Knee Complete 4 Views Left  Result Date: 05/27/2021 CLINICAL DATA:  Pain, swelling, bruising.  Fall. EXAM: LEFT KNEE - COMPLETE 4+ VIEW COMPARISON:  None. FINDINGS: Anterior soft tissue swelling. No acute bony abnormality. Specifically, no fracture, subluxation, or dislocation. No joint effusion. IMPRESSION: Anterior soft tissue swelling.  No acute bony abnormality. Electronically Signed   By: Rolm Baptise M.D.   On: 05/27/2021 05:28   ECHOCARDIOGRAM COMPLETE  Result Date: 06/04/2021    ECHOCARDIOGRAM REPORT   Patient Name:   FIORELA PELZER Date of Exam: 06/04/2021 Medical Rec #:  [id number]     Height:       66.0 in Accession #:    [id number]    Weight:       208.8 lb Date of Birth:  [birth year]  BSA:          2.037 m Patient Age:    73 years      BP:           151/77 mmHg Patient Gender: F             HR:           86 bpm. Exam Location:  [facility] Procedure: 2D Echo and Intracardiac Opacification Agent Indications:     Dyspnea R06.00  History:         Patient has prior history of Echocardiogram examinations, most                  recent 05/17/2021.                  Aortic Valve: bioprosthetic valve is present in the aortic                  position.  Sonographer:     Kathlen Brunswick RDCS Referring Phys:  [id number] AMY N COX Diagnosing Phys: Kate Sable MD  Sonographer Comments: Technically challenging study due to limited acoustic windows, Technically difficult study due to poor echo windows and no apical window. Image acquisition challenging due to patient body habitus. IMPRESSIONS  1. Left ventricular ejection fraction, by estimation, is 55 to 60%. The left ventricle has normal function. Left ventricular endocardial border not optimally defined to evaluate regional wall motion. There is moderate left ventricular hypertrophy. Left ventricular diastolic parameters are consistent with Grade II diastolic dysfunction (pseudonormalization).  2. Right ventricular systolic function is normal. The right ventricular size is not well visualized.  3. Left atrial size was mildly dilated.  4. A small pericardial effusion is present. The pericardial effusion is posterior and lateral to the left ventricle. There is no evidence of cardiac tamponade.  5. The mitral valve is degenerative. Trivial mitral valve regurgitation. Mild to moderate mitral stenosis. The mean mitral valve gradient is 8.0 mmHg.  6. The aortic valve has been repaired/replaced. Aortic valve regurgitation is not visualized. There is a bioprosthetic valve present in the aortic position.  7.  Aortic dilatation noted. There is mild dilatation of the ascending aorta, measuring 40 mm. FINDINGS  Left Ventricle: Left ventricular ejection fraction, by estimation, is 55 to 60%. The left ventricle has normal function. Left ventricular endocardial border not optimally defined to evaluate regional wall motion. Definity contrast agent was given IV to delineate the left ventricular endocardial borders. The left ventricular internal cavity size was normal in size. There is moderate left ventricular hypertrophy. Left ventricular diastolic parameters are consistent with Grade II diastolic dysfunction (pseudonormalization). Right Ventricle: The right ventricular size is not well visualized. No increase in right ventricular wall thickness. Right ventricular systolic function is normal. Left Atrium: Left atrial size was mildly dilated. Right Atrium: Right atrial size was not well visualized. Pericardium: A small pericardial effusion is present. The pericardial effusion is posterior and lateral to the left ventricle. There is no evidence of cardiac tamponade. Mitral Valve: The mitral valve is degenerative in appearance. Mild to moderate mitral annular calcification. Trivial mitral valve regurgitation. Mild to moderate mitral valve stenosis. MV peak gradient, 14.3 mmHg. The mean mitral valve gradient is 8.0 mmHg. Tricuspid Valve: The tricuspid valve is normal in structure. Tricuspid valve regurgitation is mild. Aortic Valve: The aortic valve has been repaired/replaced. Aortic valve regurgitation is not visualized. Aortic valve mean gradient measures 2.0 mmHg. Aortic valve peak gradient measures 4.1 mmHg. Aortic valve  area, by VTI measures 4.24 cm. There is a bioprosthetic valve present in the aortic position. Pulmonic Valve: The pulmonic valve was normal in structure. Pulmonic valve regurgitation is mild. Aorta: Aortic dilatation noted. There is mild dilatation of the ascending aorta, measuring 40 mm. Venous: The inferior  vena cava was not well visualized. IAS/Shunts: No atrial level shunt detected by color flow Doppler.  LEFT VENTRICLE PLAX 2D LVIDd:         4.55 cm      Diastology LVIDs:         3.15 cm      LV e' medial:    5.11 cm/s LV PW:         1.65 cm      LV E/e' medial:  31.3 LV IVS:        1.75 cm      LV e' lateral:   7.51 cm/s LVOT diam:     1.70 cm      LV E/e' lateral: 21.3 LV SV:         54 LV SV Index:   27 LVOT Area:     2.27 cm  LV Volumes (MOD) LV vol d, MOD A2C: 84.3 ml LV vol d, MOD A4C: 123.0 ml LV vol s, MOD A2C: 24.4 ml LV vol s, MOD A4C: 31.7 ml LV SV MOD A2C:     59.9 ml LV SV MOD A4C:     123.0 ml LV SV MOD BP:      79.1 ml LEFT ATRIUM           Index LA diam:      4.70 cm 2.31 cm/m LA Vol (A4C): 74.9 ml 36.77 ml/m  AORTIC VALVE                    PULMONIC VALVE AV Area (Vmax):    2.87 cm     PV Vmax:          1.32 m/s AV Area (Vmean):   2.86 cm     PV Peak grad:     7.0 mmHg AV Area (VTI):     4.24 cm     PR End Diast Vel: 6.66 msec AV Vmax:           101.35 cm/s AV Vmean:          64.200 cm/s AV VTI:            0.128 m AV Peak Grad:      4.1 mmHg AV Mean Grad:      2.0 mmHg LVOT Vmax:         128.00 cm/s LVOT Vmean:        80.900 cm/s LVOT VTI:          0.239 m LVOT/AV VTI ratio: 1.87  AORTA Ao Root diam: 2.90 cm Ao Asc diam:  4.00 cm MITRAL VALVE                TRICUSPID VALVE MV Area (PHT): 3.00 cm     TV Peak grad:   27.6 mmHg MV Area VTI:   1.02 cm     TV Vmax:        2.62 m/s MV Peak grad:  14.3 mmHg MV Mean grad:  8.0 mmHg     SHUNTS MV Vmax:       1.89 m/s     Systemic VTI:  0.24 m MV Vmean:      136.0 cm/s   Systemic  Diam: 1.70 cm MV Decel Time: 253 msec MV E velocity: 160.00 cm/s MV A velocity: 173.00 cm/s MV E/A ratio:  0.92 Kate Sable MD Electronically signed by Kate Sable MD Signature Date/Time: 06/04/2021/3:30:30 PM    Final    ECHOCARDIOGRAM COMPLETE  Result Date: 05/17/2021    ECHOCARDIOGRAM REPORT   Patient Name:   [name] Date of Exam: 05/17/2021 Medical  Rec #:  [id number]     Height:       65.0 in Accession #:    [id number]    Weight:       215.0 lb Date of Birth:  1946/02/24      BSA:          2.040 m Patient Age:    25 years      BP:           130/58 mmHg Patient Gender: F             HR:           85 bpm. Exam Location:  [place] Procedure: 2D Echo, Cardiac Doppler and Color Doppler Indications:    Z95.2 Status post TAVR  History:        Patient has prior history of Echocardiogram examinations, most                 recent 04/19/2021. 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  Sonographer:    Cresenciano Lick RDCS Referring Phys: [id number] JILL D MCDANIEL IMPRESSIONS  1. Left ventricular ejection fraction, by estimation, is 55 to 60%. The left ventricle has normal function. The left ventricle has no regional wall motion abnormalities. There is moderate left ventricular hypertrophy. Left ventricular diastolic parameters are consistent with Grade I diastolic dysfunction (impaired relaxation).  2. Right ventricular systolic function is normal. The right ventricular size is normal. There is normal pulmonary artery systolic pressure. The estimated right ventricular systolic pressure is 87.6 mmHg.  3. Left atrial size was mildly dilated.  4. Right atrial size was mildly dilated.  5. The mitral valve is degenerative. No evidence of mitral valve regurgitation. Moderate to severe mitral stenosis. The mean mitral valve gradient is 9.0 mmHg with MVA by VTI 1.12 cm^2. Moderate mitral annular calcification with extensive valvular calcification.  6. Bioprosthetic aortic valve s/p TAVR. 23 mm Russomanno Sapien THV. No perivalvular leakage noted. Mean gradient 33 mmHg is significantly elevated, dimensionless index 0.31. Would suggest TEE or gated CTA to more closely evaluate bioprosthetic valve stenosis.  7. Aortic dilatation noted. There is mild dilatation of the ascending  aorta, measuring 40 mm.  8. The inferior vena cava is normal in size with greater than 50% respiratory variability, suggesting right atrial pressure of 3 mmHg. FINDINGS  Left Ventricle: Left ventricular ejection fraction, by estimation, is 55 to 60%. The left ventricle has normal function. The left ventricle has no regional wall motion abnormalities. The left ventricular internal cavity size was normal in size. There is  moderate left ventricular hypertrophy. Left ventricular diastolic parameters are consistent with Grade I diastolic dysfunction (impaired relaxation). Right Ventricle: The right ventricular size is normal. No increase in right ventricular wall thickness. Right ventricular systolic function is normal. There is normal pulmonary artery systolic pressure. The tricuspid regurgitant velocity is  2.55 m/s, and  with an assumed right atrial pressure of 3 mmHg, the estimated right ventricular systolic pressure is 16.1 mmHg. Left Atrium: Left atrial size was mildly dilated. Right Atrium: Right atrial size was mildly dilated. Pericardium: Trivial pericardial effusion is present. Mitral Valve: The mitral valve is degenerative in appearance. There is severe calcification of the mitral valve leaflet(s). Moderate mitral annular calcification. No evidence of mitral valve regurgitation. Moderate to severe mitral valve stenosis. MV peak gradient, 14.3 mmHg. The mean mitral valve gradient is 9.0 mmHg. Tricuspid Valve: The tricuspid valve is normal in structure. Tricuspid valve regurgitation is trivial. Aortic Valve: Bioprosthetic aortic valve s/p TAVR. 23 mm Seats Sapien THV. No perivalvular leakage noted. Mean gradient 33 mmHg is significantly elevated, dimensionless index 0.31. Would suggest TEE or gated CTA to more closely evaluate bioprosthetic valve stenosis. The aortic valve has been repaired/replaced. Aortic valve regurgitation is not visualized. Aortic valve mean gradient measures 33.0 mmHg. Aortic valve peak  gradient measures 34.5 mmHg. Aortic valve area, by VTI measures 1.02 cm. Pulmonic Valve: The pulmonic valve was normal in structure. Pulmonic valve regurgitation is not visualized. Aorta: Aortic dilatation noted. There is mild dilatation of the ascending aorta, measuring 40 mm. Venous: The inferior vena cava is normal in size with greater than 50% respiratory variability, suggesting right atrial pressure of 3 mmHg. IAS/Shunts: No atrial level shunt detected by color flow Doppler.  LEFT VENTRICLE PLAX 2D LVIDd:         4.60 cm   Diastology LVIDs:         3.00 cm   LV e' medial:    6.09 cm/s LV PW:         1.20 cm   LV E/e' medial:  25.9 LV IVS:        1.20 cm   LV e' lateral:   7.62 cm/s LVOT diam:     1.90 cm   LV E/e' lateral: 20.7 LV SV:         57 LV SV Index:   28 LVOT Area:     2.84 cm  RIGHT VENTRICLE RV Basal diam:  4.00 cm RV S prime:     12.70 cm/s TAPSE (M-mode): 2.2 cm LEFT ATRIUM             Index        RIGHT ATRIUM           Index LA diam:        5.10 cm 2.50 cm/m   RA Area:     19.50 cm LA Vol (A2C):   86.4 ml 42.36 ml/m  RA Volume:   62.50 ml  30.64 ml/m LA Vol (A4C):   54.3 ml 26.62 ml/m LA Biplane Vol: 72.6 ml 35.59 ml/m  AORTIC VALVE AV Area (Vmax):    1.02 cm AV Area (Vmean):   0.95 cm AV Area (VTI):     1.02 cm AV Vmax:           293.80 cm/s AV Vmean:          218.200 cm/s AV VTI:            0.553 m AV Peak Grad:      34.5 mmHg AV Mean Grad:      33.0 mmHg LVOT Vmax:         105.50 cm/s LVOT Vmean:        73.000 cm/s LVOT VTI:          0.200 m LVOT/AV  VTI ratio: 0.36  AORTA Ao Root diam: 3.50 cm Ao Asc diam:  4.00 cm MITRAL VALVE                TRICUSPID VALVE MV Area (PHT): 3.34 cm     TR Peak grad:   26.0 mmHg MV Area VTI:   1.12 cm     TR Vmax:        255.00 cm/s MV Peak grad:  14.3 mmHg MV Mean grad:  9.0 mmHg     SHUNTS MV Vmax:       1.89 m/s     Systemic VTI:  0.20 m MV Vmean:      145.5 cm/s   Systemic Diam: 1.90 cm MV VTI:        0.50 m MV Decel Time: 227 msec MV E  velocity: 157.50 cm/s MV A velocity: 177.00 cm/s MV E/A ratio:  0.89 Dalton McleanMD Electronically signed by Franki Monte Signature Date/Time: 05/17/2021/6:15:59 PM    Final       ASSESSMENT/PLAN   Acute on chronic hypoxemic respiratory faiulre  -patient has severe multi vessel CAD based on cardiac cath few months ago  - acutely there is evidence of pulmonary edema on CT chest.  -she had episode of respiratory distress and I spoke to RN who takes care of her who explained this was panic attack and patient admits to severe anxiety. She responded well to precedex and confirts DNR/DNI status -patient does have moderate pulmonary hypertension on RHC -CTPE is negative for PE -will consider sildenafil TID for pulmonary hypertension once more diruresed -vital were rechecked during my examination and were stable  Moderate pulmonary hypertension   - sildenafil 20 TID post diuresis  Acute pulmonary edema   -recheck TTE   Acute exacerbation of COPD with chronic hypoxemia   - continue iv solumedrol 20 IV daily   GI/DVT ppx  Electrolyte derrangements - pharmacy consult   Critical care provider statement:   Total critical care time: 109 minutes   Performed by: Lanney Gins MD   Critical care time was exclusive of separately billable procedures and treating other patients.   Critical care was necessary to treat or prevent imminent or life-threatening deterioration.   Critical care was time spent personally by me on the following activities: development of treatment plan with patient and/or surrogate as well as nursing, discussions with consultants, evaluation of patient's response to treatment, examination of patient, obtaining history from patient or surrogate, ordering and performing treatments and interventions, ordering and review of laboratory studies, ordering and review of radiographic studies, pulse oximetry and re-evaluation of patient's condition.    Ottie Glazier, M.D.   Pulmonary & Critical Care Medicine       Ottie Glazier, M.D.  Division of [place]

## 2021-06-10 NOTE — Consult Note (Incomplete)
[facility] Consult   Reason for Consult:  *** Referring Physician:  *** Patient Identification: [name] MRN:  [id number] Principal Diagnosis: Dyspnea Diagnosis:  Principal Problem:   Dyspnea Active Problems:   GAD (generalized anxiety disorder)   Acquired hypothyroidism   DM type 2 (diabetes mellitus, type 2) ([place])   Obstructive sleep apnea   Essential hypertension   Chronic obstructive pulmonary disease ([place])   Insomnia   Coronary Artery Disease s/p CABG 10/2013   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   Aortic valve disorder   Dyslipidemia-statin ontol   Long-term insulin use ([place])   Stenosis of coronary artery stent   Leukocytosis   COPD exacerbation (HCC)   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 female patient admitted with ***.  HPI:  ***  Past Psychiatric History: ***  Risk to Self:   Risk to Others:   Prior Inpatient Therapy:   Prior Outpatient Therapy:    Past Medical History:  Past Medical History:  Diagnosis Date   1st degree AV block    ACE-inhibitor cough    Allergic rhinitis    Anemia    iron deficiency anemia   Anxiety    Aortic ectasia (HCC)    a. CT abd in 12/2016 incidentally noted aortic atherosclerosis and infrarenal abdominal aortic ectasia measuring as large as 2.7 cm with recommendation to repeat US in 2023.   Aortic stenosis    a. 10/2013 s/p AVR; b. 04/2021 s/p TAVR.   Arthritis    Asthma    Cataract    Chronic depression    Chronic diastolic CHF (congestive heart failure) (HCC)    Chronic headache    CKD (chronic kidney disease), stage III (HCC)    COPD (chronic obstructive pulmonary disease) (HCC)    Coronary artery disease    a. DES to RCA and mid Cx 2009. b. CABG x 2 and  bioprosthetic AVR May 2015. c. cutting balloon to prox Cx in 05/2016; d. 11/2020 PCI/DES to LCX and VG->RCA (prox and distal); e. 02/2021 Cath: LM nl, LAD 163m CTO, D1 min irregs, LCX 50p/m ISR, patent mid stent, RCA 20ost, 80p, 170m, RPDA nl, VG->dRCA patent p/d stents, LIMA->LAD patent.   Dementia ([place])    Diverticulitis of colon    Essential hypertension    GERD (gastroesophageal reflux disease)    Hearing loss    History of blood transfusion 2013   History of prosthetic aortic valve replacement    a. 10/2013 s/p bioprosthetic AVR; b. 04/2021 s/p TAVR w/ [name] 3 THV (45mm); c. 05/2021 Echo: EF 55-60%, no PV leak, 38mmHg mean gradient; d. 06/2021 Echo: EF 55-60%, mod LVH. GrII DD. Nl RV size/fxn. Mildly dil LA. Small peric eff w/o tamponade. Triv MR. Mild-mod MS (mean grad 34mmHg), Nl fxn'ing AoV - peak grad 4.23mmHg, mean grad 2.48mmHg. AVA 4.24cm^2 (VTI).   HOH (hard of hearing)    Hypercholesterolemia    a. intolerance of statins and niaspan -   IDA (iron deficiency anemia) 02/03/2019   Mitral stenosis    a. 06/2021 Echo: Mild-mod MS (mean gradient 56mmHg.   Mobitz type 1 second degree AV block    OSA (obstructive sleep apnea)    mild,  intolerant of cpap   PAD (peripheral artery disease) ([place])    a. atherosclerosis by CT abd 12/2016 in [place].   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 Sumner Boast & Dr. Cyndia Bent; b. 05/2021 Echo: EF 55-60%, no PV leak, 62mmHg mean gradient; c. 06/2021 Echo: EF 55-60%, mod LVH. GrII DD. Nl fxn'ing AoV - peak grad 4.46mmHg, mean grad 2.37mmHg. AVA 4.24cm^2 (VTI).   Statin intolerance    Thyroid disease    Type II diabetes mellitus ([place])     Past Surgical History:  Procedure Laterality Date   ABDOMINAL HYSTERECTOMY     ABDOMINAL HYSTERECTOMY W/ PARTIAL VAGINACTOMY     AORTIC VALVE REPLACEMENT N/A 10/12/2013   Procedure:  AORTIC VALVE REPLACEMENT (AVR);  Surgeon: [name], MD;  Location: [facility];  Service: Open Heart Surgery;  Laterality: N/A;   APPENDECTOMY  1964   BARTHOLIN GLAND CYST EXCISION     BLADDER SUSPENSION     BREAST BIOPSY Bilateral 09/11/2000   neg   BREAST BIOPSY Left 07/24/2010   neg   BREAST CYST EXCISION  1988   bilateral nonmalignant tumors, x3   CARDIAC CATHETERIZATION     CARDIAC CATHETERIZATION N/A 05/25/2016   Procedure: Coronary Balloon Angioplasty;  Surgeon: Leonie Man, MD;  Location: [facility];  Service: Cardiovascular;  Laterality: N/A;   CARDIAC CATHETERIZATION N/A 05/25/2016   Procedure: Coronary/Graft Angiography;  Surgeon: Leonie Man, MD;  Location: [facility];  Service: Cardiovascular;  Laterality: N/A;   CATARACT EXTRACTION W/ INTRAOCULAR LENS  IMPLANT, BILATERAL     CHOLECYSTECTOMY  2001   COLECTOMY     lap sigmoid   COLONOSCOPY  2014   polyps found, 2 clamped off.   CORONARY ANGIOPLASTY  10/29/2007   Prox RCA & Mid Cx.   CORONARY ARTERY BYPASS GRAFT N/A 10/12/2013   Procedure: CORONARY ARTERY BYPASS GRAFT TIMES TWO;  Surgeon: [name], MD;  Location: [facility];  Service: Open Heart Surgery;  Laterality: N/A;   CORONARY/GRAFT ANGIOGRAPHY N/A 09/20/2017   Procedure: CORONARY/GRAFT ANGIOGRAPHY;  Surgeon: Sherren Mocha, MD;  Location: [facility];  Service: Cardiovascular;  Laterality: N/A;   INTRAOPERATIVE TRANSTHORACIC ECHOCARDIOGRAM N/A 04/18/2021   Procedure: INTRAOPERATIVE TRANSTHORACIC ECHOCARDIOGRAM;  Surgeon: [name], MD;  Location: [facility];  Service: Open Heart Surgery;  Laterality: N/A;   LEFT HEART CATHETERIZATION WITH CORONARY ANGIOGRAM N/A 10/09/2013   Procedure: LEFT HEART CATHETERIZATION WITH CORONARY ANGIOGRAM;  Surgeon: [name], MD;  Location: [facility];  Service: Cardiovascular;  Laterality: N/A;   RIGHT/LEFT HEART CATH AND CORONARY ANGIOGRAPHY N/A 11/03/2020   Procedure:  RIGHT/LEFT HEART CATH AND CORONARY ANGIOGRAPHY;  Surgeon: Corey Skains, MD;  Location: [facility];  Service: Cardiovascular;  Laterality: N/A;   RIGHT/LEFT HEART CATH AND CORONARY/GRAFT ANGIOGRAPHY N/A 02/10/2021   Procedure: RIGHT/LEFT HEART CATH AND CORONARY/GRAFT ANGIOGRAPHY;  Surgeon: Andrez Grime, MD;  Location: [facility];  Service: Cardiovascular;  Laterality: N/A;   STERNAL WIRES REMOVAL N/A 04/13/2014   Procedure: STERNAL WIRES REMOVAL;  Surgeon: [name], MD;  Location: [facility];  Service: Thoracic;  Laterality: N/A;   TEE WITHOUT CARDIOVERSION N/A 11/03/2020   Procedure: TRANSESOPHAGEAL ECHOCARDIOGRAM (TEE);  Surgeon: Corey Skains, MD;  Location: [facility];  Service: Cardiovascular;  Laterality:  N/A;   TEE WITHOUT CARDIOVERSION N/A 02/13/2021   Procedure: TRANSESOPHAGEAL ECHOCARDIOGRAM (TEE);  Surgeon: Corey Skains, MD;  Location: [facility];  Service: Cardiovascular;  Laterality: N/A;   THYROIDECTOMY     TONSILLECTOMY     TRANSCATHETER AORTIC VALVE REPLACEMENT, TRANSFEMORAL N/A 04/18/2021   Procedure: TRANSCATHETER AORTIC VALVE REPLACEMENT, TRANSFEMORAL;  Surgeon: [name], MD;  Location: [facility];  Service: Open Heart Surgery;  Laterality: N/A;   TUBAL LIGATION     VAGINAL DELIVERY     3   VISCERAL ARTERY INTERVENTION N/A 08/16/2016   Procedure: Visceral Artery Intervention;  Surgeon: Algernon Huxley, MD;  Location: [facility];  Service: Cardiovascular;  Laterality: N/A;   Family History:  Family History  Problem Relation Age of Onset   Breast cancer Mother 49   Hypertension Father    Mesothelioma Father    Asthma Father    Stroke Paternal Grandfather    Heart disease Other    Breast cancer Maternal Aunt    Breast cancer Paternal Aunt    Family Psychiatric  History: *** Social History:  Social History   Substance and Sexual Activity  Alcohol Use No     Social History   Substance and Sexual Activity   Drug Use No    Social History   Socioeconomic History   Marital status: Married    Spouse name: Not on file   Number of children: 3   Years of education: Not on file   Highest education level: Not on file  Occupational History   Occupation: Retired    Fish farm manager: UNEMPLOYED    Comment: CNA  Tobacco Use   Smoking status: Former    Packs/day: 0.50    Years: 30.00    Pack years: 15.00    Types: Cigarettes    Quit date: 10/02/2013    Years since quitting: 7.6   Smokeless tobacco: Never  Vaping Use   Vaping Use: Never used  Substance and Sexual Activity   Alcohol use: No   Drug use: No   Sexual activity: Not Currently  Other Topics Concern   Not on file  Social History Narrative   Does not have Living Will   Desires CPR, would not want prolonged life support if futile.   Social Determinants of Health   Financial Resource Strain: Not on file  Food Insecurity: Not on file  Transportation Needs: Not on file  Physical Activity: Not on file  Stress: Not on file  Social Connections: Not on file   Additional Social History:    Allergies:   Allergies  Allergen Reactions   Amitriptyline Other (See Comments)    Unknown reaction   Benadryl [Diphenhydramine] Shortness Of Breath   Demerol [Meperidine] Other (See Comments)    Unknown reaction   Gabapentin Other (See Comments)    Unknown reaction   Mirtazapine Other (See Comments)    Unknown reaction   Olanzapine Other (See Comments)    Unknown reaction    Voltaren [Diclofenac Sodium] Shortness Of Breath   Zetia [Ezetimibe] Other (See Comments)    Weakness in legs, shakiness all over   Ativan [Lorazepam] Other (See Comments)    Causes double vision at highter than .5 mg dose   Atorvastatin Other (See Comments)    Muscle aches and weakness   Budesonide-Formoterol Fumarate Other (See Comments)    Shakiness, tremors   Bupropion Hcl Other (See Comments)    "cloud over me" depression   Caffeine Other (  See Comments)     jitters   Codeine Sulfate Other (See Comments)    Makes chest hurt like a heart attack   Lisinopril Cough   Metformin Nausea And Vomiting   Mometasone Furoate Nausea And Vomiting   Morphine Sulfate Other (See Comments)    Chest pain like a heart attack   Other Other (See Comments)    Beta Blockers, reaction shortness of breath   Oxycodone-Acetaminophen Nausea And Vomiting   Pioglitazone Other (See Comments)    Cannot take because of risk of bladder cancer   Propoxyphene N-Acetaminophen Nausea And Vomiting   Rosuvastatin Other (See Comments)    Muscle aches and weakness   Shellfish Allergy Diarrhea   Suvorexant Other (See Comments)    Jerking/nervous    Ticagrelor     Other reaction(s): Other (See Comments) "slowed heart rate" & chest pain   Tramadol Nausea Only   Venlafaxine Other (See Comments)    Unknown reaction   Xanax [Alprazolam] Other (See Comments)    Mental status changes, "didn't know where she was at" per husband at bedside   Zolpidem Tartrate Other (See Comments)     Jittery, diarrhea   Latex Rash    Labs:  Results for orders placed or performed during the hospital encounter of 06/01/2021 (from the past 48 hour(s))  Glucose, capillary     Status: None   Collection Time: 06/08/21 11:37 AM  Result Value Ref Range   Glucose-Capillary 70 70 - 99 mg/dL    Comment: Glucose reference range applies only to samples taken after fasting for at least 8 hours.  Glucose, capillary     Status: Abnormal   Collection Time: 06/08/21  4:24 PM  Result Value Ref Range   Glucose-Capillary 56 (L) 70 - 99 mg/dL    Comment: Glucose reference range applies only to samples taken after fasting for at least 8 hours.  Glucose, capillary     Status: Abnormal   Collection Time: 06/08/21  5:08 PM  Result Value Ref Range   Glucose-Capillary 117 (H) 70 - 99 mg/dL    Comment: Glucose reference range applies only to samples taken after fasting for at least 8 hours.  Glucose, capillary      Status: None   Collection Time: 06/08/21  9:04 PM  Result Value Ref Range   Glucose-Capillary 94 70 - 99 mg/dL    Comment: Glucose reference range applies only to samples taken after fasting for at least 8 hours.  CBC     Status: Abnormal   Collection Time: 06/09/21  6:19 AM  Result Value Ref Range   WBC 11.2 (H) 4.0 - 10.5 K/uL   RBC 2.88 (L) 3.87 - 5.11 MIL/uL   Hemoglobin 7.3 (L) 12.0 - 15.0 g/dL   HCT 24.0 (L) 36.0 - 46.0 %   MCV 83.3 80.0 - 100.0 fL   MCH 25.3 (L) 26.0 - 34.0 pg   MCHC 30.4 30.0 - 36.0 g/dL   RDW 15.9 (H) 11.5 - 15.5 %   Platelets 180 150 - 400 K/uL   nRBC 0.0 0.0 - 0.2 %    Comment: Performed at [facility], [address] [place], [place] [address]  Basic metabolic panel     Status: Abnormal   Collection Time: 06/09/21  6:19 AM  Result Value Ref Range   Sodium 141 135 - 145 mmol/L   Potassium 4.4 3.5 - 5.1 mmol/L   Chloride 104 98 - 111 mmol/L   CO2 26 22 -  32 mmol/L   Glucose, Bld 66 (L) 70 - 99 mg/dL    Comment: Glucose reference range applies only to samples taken after fasting for at least 8 hours.   BUN 57 (H) 8 - 23 mg/dL   Creatinine, Ser 1.42 (H) 0.44 - 1.00 mg/dL   Calcium 8.6 (L) 8.9 - 10.3 mg/dL   GFR, Estimated 38 (L) >60 mL/min    Comment: (NOTE) Calculated using the CKD-EPI Creatinine Equation (2021)    Anion gap 11 5 - 15    Comment: Performed at [facility], [address] [place], [place] [address]  Procalcitonin - Baseline     Status: None   Collection Time: 06/09/21  6:19 AM  Result Value Ref Range   Procalcitonin 1.46 ng/mL    Comment:        Interpretation: PCT > 0.5 ng/mL and <= 2 ng/mL: Systemic infection (sepsis) is possible, but other conditions are known to elevate PCT as well. (NOTE)       Sepsis PCT Algorithm           Lower Respiratory Tract                                      Infection PCT Algorithm    ----------------------------     ----------------------------         PCT < 0.25 ng/mL                 PCT < 0.10 ng/mL          Strongly encourage             Strongly discourage   discontinuation of antibiotics    initiation of antibiotics    ----------------------------     -----------------------------       PCT 0.25 - 0.50 ng/mL            PCT 0.10 - 0.25 ng/mL               OR       >80% decrease in PCT            Discourage initiation of                                            antibiotics      Encourage discontinuation           of antibiotics    ----------------------------     -----------------------------         PCT >= 0.50 ng/mL              PCT 0.26 - 0.50 ng/mL                AND       <80% decrease in PCT             Encourage initiation of                                             antibiotics       Encourage continuation           of antibiotics    ----------------------------     -----------------------------  PCT >= 0.50 ng/mL                  PCT > 0.50 ng/mL               AND         increase in PCT                  Strongly encourage                                      initiation of antibiotics    Strongly encourage escalation           of antibiotics                                     -----------------------------                                           PCT <= 0.25 ng/mL                                                 OR                                        > 80% decrease in PCT                                      Discontinue / Do not initiate                                             antibiotics  Performed at [facility], [address] [place], [place] [address]   Phosphorus     Status: Abnormal   Collection Time: 06/09/21  6:19 AM  Result Value Ref Range   Phosphorus 7.5 (H) 2.5 - 4.6 mg/dL    Comment: Performed at [facility], [address] [place], [place] [address]  Glucose, capillary     Status: Abnormal   Collection Time: 06/09/21  7:35 AM  Result Value Ref Range   Glucose-Capillary 47  (L) 70 - 99 mg/dL    Comment: Glucose reference range applies only to samples taken after fasting for at least 8 hours.   Comment 1 Notify RN   Glucose, capillary     Status: Abnormal   Collection Time: 06/09/21  8:14 AM  Result Value Ref Range   Glucose-Capillary 118 (H) 70 - 99 mg/dL    Comment: Glucose reference range applies only to samples taken after fasting for at least 8 hours.  Glucose, capillary     Status: Abnormal   Collection Time: 06/09/21 11:38 AM  Result Value Ref Range   Glucose-Capillary 246 (H) 70 - 99 mg/dL    Comment: Glucose reference range applies only to samples  taken after fasting for at least 8 hours.  Glucose, capillary     Status: Abnormal   Collection Time: 06/09/21  3:33 PM  Result Value Ref Range   Glucose-Capillary 168 (H) 70 - 99 mg/dL    Comment: Glucose reference range applies only to samples taken after fasting for at least 8 hours.  Glucose, capillary     Status: Abnormal   Collection Time: 06/09/21  9:34 PM  Result Value Ref Range   Glucose-Capillary 190 (H) 70 - 99 mg/dL    Comment: Glucose reference range applies only to samples taken after fasting for at least 8 hours.  Glucose, capillary     Status: Abnormal   Collection Time: 06/10/21  4:35 AM  Result Value Ref Range   Glucose-Capillary 119 (H) 70 - 99 mg/dL    Comment: Glucose reference range applies only to samples taken after fasting for at least 8 hours.  CBC     Status: Abnormal   Collection Time: 06/10/21  5:14 AM  Result Value Ref Range   WBC 15.7 (H) 4.0 - 10.5 K/uL   RBC 3.29 (L) 3.87 - 5.11 MIL/uL   Hemoglobin 8.3 (L) 12.0 - 15.0 g/dL   HCT 27.2 (L) 36.0 - 46.0 %   MCV 82.7 80.0 - 100.0 fL   MCH 25.2 (L) 26.0 - 34.0 pg   MCHC 30.5 30.0 - 36.0 g/dL   RDW 16.0 (H) 11.5 - 15.5 %   Platelets 223 150 - 400 K/uL   nRBC 0.1 0.0 - 0.2 %    Comment: Performed at [facility], [address] [place], [place] [address]  Basic metabolic panel     Status: Abnormal    Collection Time: 06/10/21  5:14 AM  Result Value Ref Range   Sodium 138 135 - 145 mmol/L   Potassium 4.2 3.5 - 5.1 mmol/L   Chloride 103 98 - 111 mmol/L   CO2 27 22 - 32 mmol/L   Glucose, Bld 124 (H) 70 - 99 mg/dL    Comment: Glucose reference range applies only to samples taken after fasting for at least 8 hours.   BUN 51 (H) 8 - 23 mg/dL   Creatinine, Ser 1.31 (H) 0.44 - 1.00 mg/dL   Calcium 8.5 (L) 8.9 - 10.3 mg/dL   GFR, Estimated 42 (L) >60 mL/min    Comment: (NOTE) Calculated using the CKD-EPI Creatinine Equation (2021)    Anion gap 8 5 - 15    Comment: Performed at [facility], [address], [place], [place] [address]  Magnesium     Status: None   Collection Time: 06/10/21  5:14 AM  Result Value Ref Range   Magnesium 2.2 1.7 - 2.4 mg/dL    Comment: Performed at [facility], [address] [place], [place] [address]  Phosphorus     Status: Abnormal   Collection Time: 06/10/21  5:14 AM  Result Value Ref Range   Phosphorus 4.9 (H) 2.5 - 4.6 mg/dL    Comment: Performed at [facility], [address] [place], [place] [address]  Glucose, capillary     Status: None   Collection Time: 06/10/21  7:34 AM  Result Value Ref Range   Glucose-Capillary 93 70 - 99 mg/dL    Comment: Glucose reference range applies only to samples taken after fasting for at least 8 hours.    Current Facility-Administered Medications  Medication Dose Route Frequency Provider Last Rate Last Admin   0.9 %  sodium chloride infusion  250 mL Intravenous Continuous Ottie Glazier, MD   Stopped at 06/09/21 0938   albuterol (PROVENTIL) (2.5 MG/3ML) 0.083% nebulizer solution 2.5 mg  2.5 mg Nebulization Q6H PRN Cox, Amy N, DO   2.5 mg at 06/07/21 0204   azelastine (ASTELIN) 0.1 % nasal spray 1 spray  1 spray Each Nare BID Cox, Amy N, DO   1 spray at 06/09/21 2145   benzonatate (TESSALON) capsule 200 mg  200 mg Oral TID PRN Cox, Amy N, DO   200 mg at 06/07/21 0608    Chlorhexidine Gluconate Cloth 2 % PADS 6 each  6 each Topical Daily Irene Pap N, DO   6 each at 06/09/21 3710   clopidogrel (PLAVIX) tablet 75 mg  75 mg Oral Daily Cox, Amy N, DO   75 mg at 06/09/21 0928   dexmedetomidine (PRECEDEX) 400 MCG/100ML (4 mcg/mL) infusion  0.4-1.5 mcg/kg/hr Intravenous Titrated Rust-Chester, Britton L, NP 29.3 mL/hr at 06/10/21 1034 1.2 mcg/kg/hr at 06/10/21 1034   doxycycline (VIBRAMYCIN) 100 mg in sodium chloride 0.9 % 250 mL IVPB  100 mg Intravenous Q12H Lorna Dibble, RPH   Stopped at 06/09/21 2345   DULoxetine (CYMBALTA) DR capsule 20 mg  20 mg Oral Q breakfast Cox, Amy N, DO   20 mg at 06/10/21 0746   enoxaparin (LOVENOX) injection 50 mg  0.5 mg/kg Subcutaneous Q24H Irene Pap N, DO   50 mg at 06/09/21 2141   feeding supplement (NEPRO CARB STEADY) liquid 237 mL  237 mL Oral BID BM Hall, Carole N, DO   237 mL at 06/09/21 1413   fentaNYL (SUBLIMAZE) injection 25 mcg  25 mcg Intravenous Q2H PRN Jordan Hawks, FNP   25 mcg at 06/10/21 0441   FLUoxetine (PROZAC) capsule 40 mg  40 mg Oral BID Cox, Amy N, DO   40 mg at 06/09/21 2140   food thickener (SIMPLYTHICK (HONEY/LEVEL 3/MODERATELY THICK)) 1 packet  1 packet Oral PRN Irene Pap N, DO       insulin aspart (novoLOG) injection 0-15 Units  0-15 Units Subcutaneous TID WC Cox, Amy N, DO   3 Units at 06/09/21 1639   insulin aspart (novoLOG) injection 0-5 Units  0-5 Units Subcutaneous QHS Cox, Amy N, DO   5 Units at 06/06/21 2307   insulin glargine-yfgn (SEMGLEE) injection 50 Units  50 Units Subcutaneous QHS Irene Pap N, DO   50 Units at 06/09/21 2141   levothyroxine (SYNTHROID) tablet 125 mcg  125 mcg Oral Q0600 Cox, Amy N, DO   125 mcg at 06/10/21 0739   LORazepam (ATIVAN) injection 0.5 mg  0.5 mg Intravenous Q8H PRN Irene Pap N, DO   0.5 mg at 06/07/21 2041   LORazepam (ATIVAN) tablet 0.5 mg  0.5 mg Oral Q6H PRN Irene Pap N, DO   0.5 mg at 06/10/21 0739   MEDLINE mouth rinse  15 mL Mouth Rinse q12n4p  Ottie Glazier, MD   15 mL at 06/09/21 1639   melatonin tablet 5 mg  5 mg Oral QHS PRN Irene Pap N, DO       methylPREDNISolone sodium succinate (SOLU-MEDROL) 40 mg/mL injection 20 mg  20 mg Intravenous Daily Ottie Glazier, MD   20 mg at 06/09/21 6269   morphine 2 MG/ML injection 2-4 mg  2-4 mg Intravenous Q4H PRN Rust-Chester, Huel Cote, NP   2 mg at 06/10/21 1112   multivitamin with minerals tablet 1 tablet  1 tablet Oral Daily Kayleen Memos,  DO       nitroGLYCERIN (NITROSTAT) SL tablet 0.4 mg  0.4 mg Sublingual Q5 min PRN Cox, Amy N, DO       norepinephrine (LEVOPHED) 4mg  in 267mL (0.016 mg/mL) premix infusion  2-10 mcg/min Intravenous Titrated Ottie Glazier, MD   Stopped at 06/07/21 2346   ondansetron (ZOFRAN) tablet 4 mg  4 mg Oral Q6H PRN Cox, Amy N, DO       Or   ondansetron (ZOFRAN) injection 4 mg  4 mg Intravenous Q6H PRN Cox, Amy N, DO   4 mg at 06/06/21 2007   pantoprazole (PROTONIX) injection 40 mg  40 mg Intravenous Daily Benita Gutter, RPH   40 mg at 06/09/21 9379    Musculoskeletal: Strength & Muscle Tone: {desc; muscle tone:32375} Gait & Station: {PE GAIT ED KWIO:97353} Patient leans: {Patient Leans:21022755}  Psychiatric Specialty Exam: Physical Exam Vitals and nursing note reviewed.  Constitutional:      Appearance: She is obese.  HENT:     Head: Normocephalic.  Neurological:     Mental Status: She is alert.  Psychiatric:        Speech: She is noncommunicative.        Cognition and Memory: Cognition is impaired.    Review of Systems  Psychiatric/Behavioral:  The patient is nervous/anxious.    Blood pressure (!) 108/51, pulse 66, temperature 98.3 F (36.8 C), temperature source Oral, resp. rate (!) 32, height 5\' 6"  (1.676 m), weight 95.5 kg, SpO2 97 %.Body mass index is 33.98 kg/m.  General Appearance: {Appearance:22683}  Eye Contact:  {BHH EYE CONTACT:22684}  Speech:  {Speech:22685}  Volume:  {Volume (PAA):22686}  Mood:  {BHH MOOD:22306}  Affect:   {Affect (PAA):22687}  Thought Process:  {Thought Process (PAA):22688}  Orientation:  {BHH ORIENTATION (PAA):22689}  Thought Content:  {Thought Content:22690}  Suicidal Thoughts:  {ST/HT (PAA):22692}  Homicidal Thoughts:  {ST/HT (PAA):22692}  Memory:  {BHH MEMORY:22881}  Judgement:  {Judgement (PAA):22694}  Insight:  {Insight (PAA):22695}  Psychomotor Activity:  {Psychomotor (PAA):22696}  Concentration:  {Concentration:21399}  Recall:  {BHH GOOD/FAIR/POOR:22877}  Fund of Knowledge:  {BHH GOOD/FAIR/POOR:22877}  Language:  {BHH GOOD/FAIR/POOR:22877}  Akathisia:  {BHH YES OR NO:22294}  Handed:  {Handed:22697}  AIMS (if indicated):     Assets:  {Assets (PAA):22698}  ADL's:  {BHH GDJ'M:42683}  Cognition:  {chl bhh cognition:304700322}  Sleep:        Physical Exam: Physical Exam Vitals and nursing note reviewed.  Constitutional:      Appearance: She is obese.  HENT:     Head: Normocephalic.  Neurological:     Mental Status: She is alert.  Psychiatric:        Speech: She is noncommunicative.        Cognition and Memory: Cognition is impaired.   Review of Systems  Psychiatric/Behavioral:  The patient is nervous/anxious.   Blood pressure (!) 108/51, pulse 66, temperature 98.3 F (36.8 C), temperature source Oral, resp. rate (!) 32, height 5\' 6"  (1.676 m), weight 95.5 kg, SpO2 97 %. Body mass index is 33.98 kg/m.  Treatment Plan Summary: {CHL [facility] MD [place] MHDQ:[id number]}  Disposition: {CHL [facility] Consult JJHE:[id number]}  Waylan Boga, NP 06/10/2021 11:20 AM

## 2021-06-10 NOTE — TOC Progression Note (Addendum)
Transition of Care [facility]) - Progression Note    Patient Details  Name: [name] MRN: [id number] Date of Birth: [birth year]  Transition of Care [facility]) CM/SW [place], RN Phone Number: 06/10/2021, 2:44 PM  Clinical Narrative:    Referral for Hospice services received. Call to [facility] for evaluation. Patient in need of Hospice House services.   Received call back from [facility] did not have current bed openings but referral could be called to [phone]. Requested H/P be faxed to [phone].   RN CM faxed requested information for Hospice House referral.      Expected Discharge Plan: Skilled Nursing Facility Barriers to Discharge: Continued Medical Work up  Expected Discharge Plan and Services Expected Discharge Plan: [place]   Discharge Planning Services: CM Consult Post Acute Care Choice: [place] Living arrangements for the past 2 months: Single Family Home                 DME Arranged: N/A DME Agency: NA       HH Arranged: NA HH Agency: NA         Social Determinants of Health (SDOH) Interventions    Readmission Risk Interventions Readmission Risk Prevention Plan 02/11/2021 01/31/2021 09/08/2020  Transportation Screening Complete - Complete  PCP or Specialist Appt within 3-5 Days - - Complete  HRI or Home Care Consult - - Complete  Palliative Care Screening - - Not Applicable  Medication Review (RN Care Manager) Complete - Complete  PCP or Specialist appointment within 3-5 days of discharge Complete Complete -  [place] or Home Care Consult Patient refused Patient refused -  SW Recovery Care/Counseling Consult Complete - -  Palliative Care Screening Complete Not Applicable -  [place] Not Applicable Not Applicable -  Some recent data might be hidden

## 2021-06-10 NOTE — TOC Progression Note (Signed)
Transition of Care [facility]) - Progression Note    Patient Details  Name: [name] MRN: [id number] Date of Birth: 1946/04/27  Transition of Care [facility]) CM/SW Contact  Anselm Pancoast, RN Phone Number: 06/10/2021, 12:22 PM  Clinical Narrative:    Spoke to spouse, Roger regarding discharge disposition. Francee Piccolo reports he has been given a poor prognosis for patient and is worried about spending time with patient. Reports he only has one leg and is not able to spend as much time as he would like at the bedside. RN CM offered patient WC assistance if that would help with visiting. Encouraged spouse to notify volunteers or staff  of any ways we could assist family.    Expected Discharge Plan: Skilled Nursing Facility Barriers to Discharge: Continued Medical Work up  Expected Discharge Plan and Services Expected Discharge Plan: [place]   Discharge Planning Services: CM Consult Post Acute Care Choice: [place] Living arrangements for the past 2 months: Single Family Home                 DME Arranged: N/A DME Agency: NA       HH Arranged: NA HH Agency: NA         Social Determinants of Health (SDOH) Interventions    Readmission Risk Interventions Readmission Risk Prevention Plan 02/11/2021 01/31/2021 09/08/2020  Transportation Screening Complete - Complete  PCP or Specialist Appt within 3-5 Days - - Complete  HRI or Home Care Consult - - Complete  Palliative Care Screening - - Not Applicable  Medication Review (RN Care Manager) Complete - Complete  PCP or Specialist appointment within 3-5 days of discharge Complete Complete -  [place] or Home Care Consult Patient refused Patient refused -  SW Recovery Care/Counseling Consult Complete - -  Palliative Care Screening Complete Not Applicable -  [place] Not Applicable Not Applicable -  Some recent data might be hidden

## 2021-06-10 NOTE — Consult Note (Addendum)
This provider attempted to assess the client, intubated and not awake nor would she awaken.  Family at her bedside reported her talking yesterday but today, "It's just mumbo jumbo."  The MD reports her anxiety is high with frequent panic attacks that has led to multiple CTs and other procedures.  She is prescribed Ativan and takes the whole bottle in a day as she cannot stop her anxiety and panic attacks.  Recommend starting gabapentin at 300 mg TID at this time (shows allergy, will explore for her reaction when she is awake) as she has breathing issues and COPD.  She is currently prescribed by her regular provider Prozac 40 mg BID and Cymbalta 20 mg daily which puts her at high risk for serotonin syndrome, reduced Prozac to 20 mg BID.  The Prozac may be activating and increasing her anxiety.  Celexa/Lexapro works well with geriatrics.    Waylan Boga, PMHNP

## 2021-06-10 NOTE — Progress Notes (Signed)
Manufacturing engineer [facility]) Hospice hospital liaison note  Received request from [facility] for family interest in hospice home. Chart reviewed and hospice home eligibility is pending at this time. Have left message with family to acknowledge referral and explain services. Unfortunately hospice home does not have a bed to offer today. TOC is aware hospital liaison will follow up tomorrow or sooner if room becomes available.   Please do not hesitate to call with any hospice related questions or concerns.   Thank you for the opportunity to participate in this patient's care.  Jhonnie Garner, Therapist, sports, BSN [facility] Liaison  [phone]

## 2021-06-10 NOTE — Evaluation (Addendum)
Physical Therapy Re-Evaluation Patient Details Name: [name] MRN: [id number] DOB: 1946-02-10 Today's Date: 06/10/2021  History of Present Illness  Pt is a 76 y/o F admitted on 05/28/2021 after presenting to the ED with c/o worsening malaise, generalized weakness, DOE & at rest. Work-up revealed severe acute hypoxic respiratory failure 2/2 CAP & COPD exacerbation. Hospital course complicated by worsening respiratory distress on 06/07/2021 for which she was transferred to stepdown unit and was placed on BiPAP continuously. Pt is now on [facility] & PT has been re-consulted for re-evaluation. 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 Pt lethargic throughout session, maintaining eyes closed, groaning but with very few understandable words. Pt requires max assist for supine<>sit & +2 assist for rolling L<>R. Pt sits EOB ~1-2 minutes with supervision increasing to mod assist with encouragement to remove cold cloth from forehead with pt requiring assistance to elevate LUE shoulder enough to reach it. Pt does not increase alertness despite PT attempts so assisted back to bed. At this time pt is unsafe to d/c home alone & would benefit from STR upon d/c to maximize independence with functional mobility, decrease caregiver burden, & reduce fall risk prior to return home.  Pt on 45L/min HHFNC with SPO2 >90% throughout session.     Recommendations for follow up therapy are one component of a multi-disciplinary discharge planning process, led by the attending physician.  Recommendations may be updated based on patient status, additional functional criteria and insurance authorization.  Follow Up Recommendations Skilled nursing-short term rehab (<3 hours/day)    Assistance Recommended at Discharge Frequent or constant Supervision/Assistance  Patient can return home with the  following  Assistance with cooking/housework;Direct supervision/assist for financial management;Assist for transportation;Assistance with feeding;Two people to help with walking and/or transfers;Help with stairs or ramp for entrance;Direct supervision/assist for medications management;Two people to help with bathing/dressing/bathroom    Equipment Recommendations Wheelchair cushion (measurements PT);Wheelchair (measurements PT);Hospital bed  Recommendations for Other Services       Functional Status Assessment Patient has had a recent decline in their functional status and demonstrates the ability to make significant improvements in function in a reasonable and predictable amount of time.     Precautions / Restrictions Precautions Precautions: Fall Restrictions Weight Bearing Restrictions: No      Mobility  Bed Mobility Overal bed mobility: Needs Assistance Bed Mobility: Rolling;Supine to Sit;Sit to Supine Rolling: Mod assist;Max assist;+2 for physical assistance   Supine to sit: Max assist;HOB elevated Sit to supine: Max assist;HOB elevated        Transfers                        Ambulation/Gait                  Stairs            Wheelchair Mobility    Modified Rankin (Stroke Patients Only)       Balance Overall balance assessment: Needs assistance Sitting-balance support: Feet unsupported;Bilateral upper extremity supported Sitting balance-Leahy Scale: Poor Sitting balance - Comments: close supervision increasing to mod assist 2/2 increasing posterior lean                                     Pertinent Vitals/Pain Pain Assessment: Faces Faces Pain  Scale: Hurts a little bit Pain Location: generalized Pain Intervention(s): Monitored during session;Repositioned    Home Living Family/patient expects to be discharged to:: Private residence Living Arrangements: Spouse/significant other Available Help at Discharge:  Family;Available 24 hours/day (husband is mobile with prosthetic, is primary caregiver to pt) Type of Home: House Home Access: Stairs to enter Entrance Stairs-Rails: Left Entrance Stairs-Number of Steps: 5   Home Layout: One level Home Equipment: Shower seat;Hand held shower head;Grab bars - tub/shower;Rolling Walker (2 wheels);BSC/3in1 Additional Comments: Uses O2 at baseline (3L)    Prior Function Prior Level of Function : Needs assist       Physical Assist : Mobility (physical);ADLs (physical) Mobility (physical): Stairs;Gait ADLs (physical): Bathing;IADLs Mobility Comments: very limited ambulation distances; does not use AD, states she moves very slowly ADLs Comments: husband present for showers; assist with all IADLs     Hand Dominance   Dominant Hand: Right    Extremity/Trunk Assessment   Upper Extremity Assessment Upper Extremity Assessment: Generalized weakness;Difficult to assess due to impaired cognition    Lower Extremity Assessment Lower Extremity Assessment: Generalized weakness;Difficult to assess due to impaired cognition       Communication   Communication: HOH  Cognition Arousal/Alertness: Lethargic   Overall Cognitive Status: Difficult to assess                                 General Comments: Pt doesn't follow simple commands during session despite mulitmodal cuing, pt requires assistance & cuing to open eyes but does not keep them open for more than 1 second & returns to eyes closed. Lethargic but nurse reports pt should not be based on medication.        General Comments      Exercises     Assessment/Plan    PT Assessment Patient needs continued PT services  PT Problem List Decreased strength;Decreased mobility;Decreased safety awareness;Decreased coordination;Decreased activity tolerance;Cardiopulmonary status limiting activity;Decreased cognition;Decreased balance;Decreased knowledge of use of DME       PT Treatment  Interventions DME instruction;Therapeutic activities;Gait training;Therapeutic exercise;Patient/family education;Stair training;Balance training;Functional mobility training;Neuromuscular re-education;Cognitive remediation;Modalities;Manual techniques    PT Goals (Current goals can be found in the Care Plan section)  Acute Rehab PT Goals PT Goal Formulation: Patient unable to participate in goal setting Time For Goal Achievement: 06/24/21 Potential to Achieve Goals: Fair    Frequency Min 2X/week     Co-evaluation               AM-PAC PT "6 Clicks" Mobility  Outcome Measure Help needed turning from your back to your side while in a flat bed without using bedrails?: Total Help needed moving from lying on your back to sitting on the side of a flat bed without using bedrails?: Total Help needed moving to and from a bed to a chair (including a wheelchair)?: Total Help needed standing up from a chair using your arms (e.g., wheelchair or bedside chair)?: Total Help needed to walk in hospital room?: Total Help needed climbing 3-5 steps with a railing? : Total 6 Click Score: 6    End of Session Equipment Utilized During Treatment: Oxygen Activity Tolerance: Patient limited by lethargy Patient left: in bed;with bed alarm set;with call bell/phone within reach;with nursing/sitter in room Nurse Communication: Mobility status PT Visit Diagnosis: Unsteadiness on feet (R26.81);Muscle weakness (generalized) (M62.81);Difficulty in walking, not elsewhere classified (R26.2)    Time: 6387-5643 PT Time Calculation (min) (ACUTE ONLY): 10  min   Charges:   PT Evaluation $PT Re-evaluation: 1 Re-eval          Lavone Nian, PT, DPT 06/10/21, 9:56 AM   [name] 06/10/2021, 9:53 AM

## 2021-06-11 LAB — CBC WITH DIFFERENTIAL/PLATELET
Abs Immature Granulocytes: 0.07 10*3/uL (ref 0.00–0.07)
Basophils Absolute: 0 10*3/uL (ref 0.0–0.1)
Basophils Relative: 0 %
Eosinophils Absolute: 0.1 10*3/uL (ref 0.0–0.5)
Eosinophils Relative: 1 %
HCT: 24.5 % — ABNORMAL LOW (ref 36.0–46.0)
Hemoglobin: 7.4 g/dL — ABNORMAL LOW (ref 12.0–15.0)
Immature Granulocytes: 1 %
Lymphocytes Relative: 9 %
Lymphs Abs: 0.9 10*3/uL (ref 0.7–4.0)
MCH: 25.1 pg — ABNORMAL LOW (ref 26.0–34.0)
MCHC: 30.2 g/dL (ref 30.0–36.0)
MCV: 83.1 fL (ref 80.0–100.0)
Monocytes Absolute: 0.7 10*3/uL (ref 0.1–1.0)
Monocytes Relative: 7 %
Neutro Abs: 8.9 10*3/uL — ABNORMAL HIGH (ref 1.7–7.7)
Neutrophils Relative %: 82 %
Platelets: 180 10*3/uL (ref 150–400)
RBC: 2.95 MIL/uL — ABNORMAL LOW (ref 3.87–5.11)
RDW: 15.9 % — ABNORMAL HIGH (ref 11.5–15.5)
WBC: 10.8 10*3/uL — ABNORMAL HIGH (ref 4.0–10.5)
nRBC: 0.2 % (ref 0.0–0.2)

## 2021-06-11 LAB — COMPREHENSIVE METABOLIC PANEL
ALT: 15 U/L (ref 0–44)
AST: 17 U/L (ref 15–41)
Albumin: 2.5 g/dL — ABNORMAL LOW (ref 3.5–5.0)
Alkaline Phosphatase: 109 U/L (ref 38–126)
Anion gap: 8 (ref 5–15)
BUN: 51 mg/dL — ABNORMAL HIGH (ref 8–23)
CO2: 25 mmol/L (ref 22–32)
Calcium: 8.6 mg/dL — ABNORMAL LOW (ref 8.9–10.3)
Chloride: 106 mmol/L (ref 98–111)
Creatinine, Ser: 1.24 mg/dL — ABNORMAL HIGH (ref 0.44–1.00)
GFR, Estimated: 45 mL/min — ABNORMAL LOW (ref >60)
Glucose, Bld: 92 mg/dL (ref 70–99)
Potassium: 4.5 mmol/L (ref 3.5–5.1)
Sodium: 139 mmol/L (ref 135–145)
Total Bilirubin: 0.7 mg/dL (ref 0.3–1.2)
Total Protein: 5.9 g/dL — ABNORMAL LOW (ref 6.5–8.1)

## 2021-06-11 LAB — GLUCOSE, CAPILLARY
Glucose-Capillary: 119 mg/dL — ABNORMAL HIGH (ref 70–99)
Glucose-Capillary: 81 mg/dL (ref 70–99)
Glucose-Capillary: 102 mg/dL — ABNORMAL HIGH (ref 70–99)
Glucose-Capillary: 149 mg/dL — ABNORMAL HIGH (ref 70–99)

## 2021-06-11 LAB — MAGNESIUM: Magnesium: 2.1 mg/dL (ref 1.7–2.4)

## 2021-06-11 LAB — PROCALCITONIN: Procalcitonin: 0.64 ng/mL

## 2021-06-11 LAB — PHOSPHORUS: Phosphorus: 5.3 mg/dL — ABNORMAL HIGH (ref 2.5–4.6)

## 2021-06-11 MED ORDER — MORPHINE SULFATE (PF) 2 MG/ML IV SOLN
INTRAVENOUS | Status: AC
  Filled 2021-06-11: qty 1

## 2021-06-11 MED ORDER — LORAZEPAM 2 MG/ML IJ SOLN: 1.0000 mg | Freq: Once | INTRAMUSCULAR | Status: AC

## 2021-06-11 MED ORDER — MORPHINE SULFATE (PF) 2 MG/ML IV SOLN
2.0000 mg | Freq: Once | INTRAVENOUS | Status: AC
  Administered 2021-06-11: 2 mg via INTRAVENOUS

## 2021-06-11 MED ORDER — LORAZEPAM 2 MG/ML IJ SOLN
0.5000 mg | Freq: Four times a day (QID) | INTRAMUSCULAR | Status: DC | PRN
  Administered 2021-06-11 – 2021-06-12 (×2): 0.5 mg via INTRAVENOUS
  Filled 2021-06-11 (×2): qty 1

## 2021-06-11 MED ORDER — LORAZEPAM 2 MG/ML IJ SOLN
INTRAMUSCULAR | Status: AC
  Administered 2021-06-11: 1 mg via INTRAVENOUS
  Filled 2021-06-11: qty 1

## 2021-06-11 MED ORDER — FUROSEMIDE 10 MG/ML IJ SOLN
40.0000 mg | Freq: Every day | INTRAMUSCULAR | Status: DC
  Administered 2021-06-11: 40 mg via INTRAVENOUS
  Filled 2021-06-11: qty 4

## 2021-06-11 NOTE — Consult Note (Signed)
Client continues to be non-verbal, intubated.  Her Rx dose of Ativan is 0.5 mg BID which she has been getting for months via the PDMP.    Waylan Boga, PMHNP

## 2021-06-11 NOTE — Progress Notes (Signed)
[place] ICU [facility]) Hospital Liaison Note   Patient chart and information reviewed by [facility] physician. Hospice Home eligibility confirmed.    Unfortunately, Hospice Home is not able to offer a room today. Family and [facility] Manager aware hospital liaison will follow up tomorrow or sooner if a room becomes available.    Please do not hesitate to call with any hospice related questions.    Thank you for the opportunity to participate in this patient's care.   Bobbie "Loren Racer, RN, BSN [facility] Liaison [phone]

## 2021-06-11 NOTE — Progress Notes (Signed)
CRITICAL CARE         Date: 06/11/2021,   MRN# [id number] [name] [birth year]     AdmissionWeight: 98.1 kg                 CurrentWeight: 92 kg   Referring physician: Dr Jimmye Norman   CHIEF COMPLAINT:   Severe acute on chronic hypoxemic respiratory failure   HISTORY OF PRESENT ILLNESS   76 yo with hx of CABG 2015 , depression,anxiety, hypertension, OSA, COPD, iron deficiency anemia, aortic dilatation, presents emergency department for cc of dyspnea and sob. She is chronically hypoxemic with 3L/min [place] requirement. Came in for acute onset of sob/doe was negative for PE on admission. She is on BIPAP in resp distress.She denies recent fever, nausea, vomiting, abdominal pain, dysuria, diarrhea, hematuria, blood in her stool, syncope, lost of consciousness. She reports chest pain and severe SOB.She has severe anxiety and recurrent panic attacks She reports no new cough. She is unsure if she has had weight gain or lost. She denies flu like illness. She denies LE edema. PCCM consult placed for further evaluation and management.   06/08/21- patient is improved, she did have few episodes of mild bradycardia 40s. I Met with son and husband today to review medical plan and prognosis. They have met with palliative care today.   06/09/21- patient had severe and recurrent anxiety.  She had multiple episodes of severe respiratory distress with O2 100% and only responding to anxiolytics and sedatives.   06/10/20-  I met with patient several times today she continues to ask for drugs to "put me out" due to severe dyspnea with COPD severe anxiety disorder. I have met with family today numerous times and examined patient with them.  We reviewed hosptial course and medical plan at length  06/11/21- Husband is agreeable to initiate comfort care on 07-11-2021.    PAST MEDICAL HISTORY   Past Medical History:  Diagnosis Date   1st degree AV block    ACE-inhibitor cough    Allergic rhinitis    Anemia     iron deficiency anemia   Anxiety    Aortic ectasia (HCC)    a. CT abd in 12/2016 incidentally noted aortic atherosclerosis and infrarenal abdominal aortic ectasia measuring as large as 2.7 cm with recommendation to repeat US in 2023.   Aortic stenosis    a. 10/2013 s/p AVR; b. 04/2021 s/p TAVR.   Arthritis    Asthma    Cataract    Chronic depression    Chronic diastolic CHF (congestive heart failure) (HCC)    Chronic headache    CKD (chronic kidney disease), stage III (HCC)    COPD (chronic obstructive pulmonary disease) (HCC)    Coronary artery disease    a. DES to RCA and mid Cx 2009. b. CABG x 2 and bioprosthetic AVR May 2015. c. cutting balloon to prox Cx in 05/2016; d. 11/2020 PCI/DES to LCX and VG->RCA (prox and distal); e. 02/2021 Cath: LM nl, LAD 121mCTO, D1 min irregs, LCX 50p/m ISR, patent mid stent, RCA 20ost, 80p, 1044mRPDA nl, VG->dRCA patent p/d stents, LIMA->LAD patent.   Dementia (HCVista   Diverticulitis of colon    Essential hypertension    GERD (gastroesophageal reflux disease)    Hearing loss    History of blood transfusion 2013   History of prosthetic aortic valve replacement    a. 10/2013 s/p bioprosthetic AVR; b. 04/2021 s/p TAVR w/ EdOletta Lamasapien 3 THV (  57m); c. 05/2021 Echo: EF 55-60%, no PV leak, 331mg mean gradient; d. 06/2021 Echo: EF 55-60%, mod LVH. GrII DD. Nl RV size/fxn. Mildly dil LA. Small peric eff w/o tamponade. Triv MR. Mild-mod MS (mean grad 75m42m), Nl fxn'ing AoV - peak grad 4.1mm5m mean grad 2.0mmH74mAVA 4.24cm^2 (VTI).   HOH (hard of hearing)    Hypercholesterolemia    a. intolerance of statins and niaspan -   IDA (iron deficiency anemia) 02/03/2019   Mitral stenosis    a. 06/2021 Echo: Mild-mod MS (mean gradient 75mmHg7m Mobitz type 1 second degree AV block    OSA (obstructive sleep apnea)    mild, intolerant of cpap   PAD (peripheral artery disease) (HCC)  Cecil-Bishop. atherosclerosis by CT abd 12/2016 in LE.   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 McAlhnSumner Boast BartleCyndia Bent2/2022 Echo: EF 55-60%, no PV leak, 33mmHg62mn gradient; c. 06/2021 Echo: EF 55-60%, mod LVH. GrII DD. Nl fxn'ing AoV - peak grad 4.1mmHg, 62mn grad 2.0mmHg. A89m4.24cm^2 (VTI).   Statin intolerance    Thyroid disease    Type II diabetes mellitus (HCC)     [place]ford[place]ICAL HISTORY   Past Surgical History:  Procedure Laterality Date   ABDOMINAL HYSTERECTOMY     ABDOMINAL HYSTERECTOMY W/ PARTIAL VAGINACTOMY     AORTIC VALVE REPLACEMENT N/A 10/12/2013   Procedure: AORTIC VALVE REPLACEMENT (AVR);  Surgeon: Bryan K BGaye Pollackcation: [facility] OR;  [place]: Open Heart Surgery;  Laterality: N/A;   APPENDECTOMY  1964   BARTHOLIN GLAND CYST EXCISION     BLADDER SUSPENSION     BREAST BIOPSY Bilateral 09/11/2000   neg   BREAST BIOPSY Left 07/24/2010   neg   BREAST CYST EXCISION  1988   bilateral nonmalignant tumors, x3   CARDIAC CATHETERIZATION     CARDIAC CATHETERIZATION N/A 05/25/2016   Procedure: Coronary Balloon Angioplasty;  Surgeon: David W HLeonie Mancation: [facility] [place] Service: Cardiovascular;  Laterality: N/A;   CARDIAC CATHETERIZATION N/A 05/25/2016   Procedure: Coronary/Graft Angiography;  Surgeon: David W HLeonie Mancation: [facility] [place] Service: Cardiovascular;  Laterality: N/A;   CATARACT EXTRACTION W/ INTRAOCULAR LENS  IMPLANT, BILATERAL     CHOLECYSTECTOMY  2001   COLECTOMY     lap sigmoid   COLONOSCOPY  2014   polyps found, 2 clamped off.   CORONARY ANGIOPLASTY  10/29/2007   Prox RCA & Mid Cx.   CORONARY ARTERY BYPASS GRAFT N/A 10/12/2013   Procedure: CORONARY ARTERY BYPASS GRAFT TIMES TWO;  Surgeon: Bryan K BGaye Pollackcation: [facility] OR;  [place]: Open Heart Surgery;  Laterality: N/A;   CORONARY/GRAFT ANGIOGRAPHY N/A 09/20/2017   Procedure: CORONARY/GRAFT ANGIOGRAPHY;  Surgeon:  Cooper, MSherren Mochacation: [facility] [place] Service: Cardiovascular;  Laterality: N/A;   INTRAOPERATIVE TRANSTHORACIC ECHOCARDIOGRAM N/A 04/18/2021   Procedure: INTRAOPERATIVE TRANSTHORACIC ECHOCARDIOGRAM;  Surgeon: McAlhany,Burnell Blankscation: [facility] [place] Service: Open Heart Surgery;  Laterality: N/A;   LEFT HEART CATHETERIZATION WITH CORONARY ANGIOGRAM N/A 10/09/2013   Procedure: LEFT HEART CATHETERIZATION WITH CORONARY ANGIOGRAM;  Surgeon: ChristophBurnell Blankscation: [facility];  Service: Cardiovascular;  Laterality: N/A;   RIGHT/LEFT HEART CATH AND CORONARY ANGIOGRAPHY N/A 11/03/2020  Procedure: RIGHT/LEFT HEART CATH AND CORONARY ANGIOGRAPHY;  Surgeon: Corey Skains, MD;  Location: [place] [facility];  Service: Cardiovascular;  Laterality: N/A;   RIGHT/LEFT HEART CATH AND CORONARY/GRAFT ANGIOGRAPHY N/A 02/10/2021   Procedure: RIGHT/LEFT HEART CATH AND CORONARY/GRAFT ANGIOGRAPHY;  Surgeon: Andrez Grime, MD;  Location: [place] [facility];  Service: Cardiovascular;  Laterality: N/A;   STERNAL WIRES REMOVAL N/A 04/13/2014   Procedure: STERNAL WIRES REMOVAL;  Surgeon: [name], MD;  Location: [place];  Service: Thoracic;  Laterality: N/A;   TEE WITHOUT CARDIOVERSION N/A 11/03/2020   Procedure: TRANSESOPHAGEAL ECHOCARDIOGRAM (TEE);  Surgeon: Corey Skains, MD;  Location: [facility];  Service: Cardiovascular;  Laterality: N/A;   TEE WITHOUT CARDIOVERSION N/A 02/13/2021   Procedure: TRANSESOPHAGEAL ECHOCARDIOGRAM (TEE);  Surgeon: Corey Skains, MD;  Location: [facility];  Service: Cardiovascular;  Laterality: N/A;   THYROIDECTOMY     TONSILLECTOMY     TRANSCATHETER AORTIC VALVE REPLACEMENT, TRANSFEMORAL N/A 04/18/2021   Procedure: TRANSCATHETER AORTIC VALVE REPLACEMENT, TRANSFEMORAL;  Surgeon: [name], MD;  Location: [place] [facility];  Service: Open Heart Surgery;  Laterality: N/A;   TUBAL LIGATION     VAGINAL DELIVERY     3    VISCERAL ARTERY INTERVENTION N/A 08/16/2016   Procedure: Visceral Artery Intervention;  Surgeon: Algernon Huxley, MD;  Location: [place] [facility];  Service: Cardiovascular;  Laterality: N/A;     FAMILY HISTORY   Family History  Problem Relation Age of Onset   Breast cancer Mother 14   Hypertension Father    Mesothelioma Father    Asthma Father    Stroke Paternal Grandfather    Heart disease Other    Breast cancer Maternal Aunt    Breast cancer Paternal Aunt      SOCIAL HISTORY   Social History   Tobacco Use   Smoking status: Former    Packs/day: 0.50    Years: 30.00    Pack years: 15.00    Types: Cigarettes    Quit date: 10/02/2013    Years since quitting: 7.6   Smokeless tobacco: Never  Vaping Use   Vaping Use: Never used  Substance Use Topics   Alcohol use: No   Drug use: No     MEDICATIONS    Home Medication:    Current Medication:  Current Facility-Administered Medications:    0.9 %  sodium chloride infusion, 250 mL, Intravenous, Continuous, Makiah Foye, MD, Stopped at 06/09/21 0938   albuterol (PROVENTIL) (2.5 MG/3ML) 0.083% nebulizer solution 2.5 mg, 2.5 mg, Nebulization, Q6H PRN, Cox, Amy N, DO, 2.5 mg at 06/07/21 0204   azelastine (ASTELIN) 0.1 % nasal spray 1 spray, 1 spray, Each Nare, BID, Cox, Amy N, DO, 1 spray at 06/11/21 1100   Chlorhexidine Gluconate Cloth 2 % PADS 6 each, 6 each, Topical, Daily, Irene Pap N, DO, 6 each at 06/11/21 1056   clopidogrel (PLAVIX) tablet 75 mg, 75 mg, Oral, Daily, Cox, Amy N, DO, 75 mg at 06/10/21 1228   dexmedetomidine (PRECEDEX) 400 MCG/100ML (4 mcg/mL) infusion, 0.4-1.5 mcg/kg/hr, Intravenous, Titrated, Rust-Chester, Britton L, NP, Last Rate: 36.6 mL/hr at 06/11/21 0606, 1.5 mcg/kg/hr at 06/11/21 0606   doxycycline (VIBRAMYCIN) 100 mg in sodium chloride 0.9 % 250 mL IVPB, 100 mg, Intravenous, Q12H, Beers, Shanon Brow, RPH, Stopped at 06/11/21 0003   DULoxetine (CYMBALTA) DR capsule 20 mg, 20 mg, Oral, Q  breakfast, Cox, Amy N, DO, 20 mg at 06/10/21 0746   enoxaparin (LOVENOX)  injection 50 mg, 0.5 mg/kg, Subcutaneous, Q24H, Hall, Carole N, DO, 50 mg at 06/10/21 2155   feeding supplement (NEPRO CARB STEADY) liquid 237 mL, 237 mL, Oral, BID BM, Hall, Carole N, DO, 237 mL at 06/09/21 1413   FLUoxetine (PROZAC) capsule 20 mg, 20 mg, Oral, BID, Lord, Jamison Y, NP, 20 mg at 06/10/21 2159   food thickener (SIMPLYTHICK (HONEY/LEVEL 3/MODERATELY THICK)) 1 packet, 1 packet, Oral, PRN, Irene Pap N, DO   furosemide (LASIX) injection 40 mg, 40 mg, Intravenous, Daily, Ottie Glazier, MD, 40 mg at 06/11/21 1054   insulin aspart (novoLOG) injection 0-15 Units, 0-15 Units, Subcutaneous, TID WC, Cox, Amy N, DO, 3 Units at 06/09/21 1639   insulin aspart (novoLOG) injection 0-5 Units, 0-5 Units, Subcutaneous, QHS, Cox, Amy N, DO, 5 Units at 06/06/21 2307   insulin glargine-yfgn (SEMGLEE) injection 50 Units, 50 Units, Subcutaneous, QHS, Kayleen Memos, DO, 50 Units at 06/10/21 2155   levothyroxine (SYNTHROID) tablet 125 mcg, 125 mcg, Oral, Q0600, Cox, Amy N, DO, 125 mcg at 06/10/21 0739   LORazepam (ATIVAN) injection 0.5 mg, 0.5 mg, Intravenous, Q8H PRN, [place] Crane, Carole N, DO, 0.5 mg at 06/07/21 2041   LORazepam (ATIVAN) tablet 0.5 mg, 0.5 mg, Oral, Q6H PRN, Irene Pap N, DO, 0.5 mg at 06/11/21 0630   MEDLINE mouth rinse, 15 mL, Mouth Rinse, q12n4p, Hamish Banks, MD, 15 mL at 06/09/21 1639   melatonin tablet 5 mg, 5 mg, Oral, QHS PRN, [place] Crane, Carole N, DO   methylPREDNISolone sodium succinate (SOLU-MEDROL) 40 mg/mL injection 20 mg, 20 mg, Intravenous, Daily, Lanney Gins, Nakota Elsen, MD, 20 mg at 06/11/21 1055   morphine 2 MG/ML injection 2 mg, 2 mg, Intravenous, Q2H PRN, Ottie Glazier, MD, 2 mg at 06/11/21 1056   multivitamin with minerals tablet 1 tablet, 1 tablet, Oral, Daily, Hall, Carole N, DO   nitroGLYCERIN (NITROSTAT) SL tablet 0.4 mg, 0.4 mg, Sublingual, Q5 min PRN, Cox, Amy N, DO   ondansetron (ZOFRAN) tablet 4  mg, 4 mg, Oral, Q6H PRN **OR** ondansetron (ZOFRAN) injection 4 mg, 4 mg, Intravenous, Q6H PRN, Cox, Amy N, DO, 4 mg at 06/06/21 2007   pantoprazole (PROTONIX) injection 40 mg, 40 mg, Intravenous, Daily, Benita Gutter, RPH, 40 mg at 06/11/21 1055    ALLERGIES   Amitriptyline, Benadryl [diphenhydramine], Demerol [meperidine], Gabapentin, Mirtazapine, Olanzapine, Voltaren [diclofenac sodium], Zetia [ezetimibe], Ativan [lorazepam], Atorvastatin, Budesonide-formoterol fumarate, Bupropion hcl, Caffeine, Codeine sulfate, Lisinopril, Metformin, Mometasone furoate, Morphine sulfate, Other, Oxycodone-acetaminophen, Pioglitazone, Propoxyphene n-acetaminophen, Rosuvastatin, Shellfish allergy, Suvorexant, Ticagrelor, Tramadol, Venlafaxine, Xanax [alprazolam], Zolpidem tartrate, and Latex     REVIEW OF SYSTEMS    Review of Systems:  Gen:  Denies  fever, sweats, chills weigh loss  HEENT: Denies blurred vision, double vision, ear pain, eye pain, hearing loss, nose bleeds, sore throat Cardiac:  No dizziness, chest pain or heaviness, chest tightness,edema Resp:   Denies cough or sputum porduction, shortness of breath,wheezing, hemoptysis,  Gi: Denies swallowing difficulty, stomach pain, nausea or vomiting, diarrhea, constipation, bowel incontinence Gu:  Denies bladder incontinence, burning urine Ext:   Denies Joint pain, stiffness or swelling Skin: Denies  skin rash, easy bruising or bleeding or hives Endoc:  Denies polyuria, polydipsia , polyphagia or weight change Psych:   Denies depression, insomnia or hallucinations   Other:  All other systems negative   VS: BP (!) 134/105 (BP Location: Right Arm)    Pulse (!) 41    Temp 98.2 F (36.8 C) (Axillary)    Resp (!) 34  Ht _0  (1.676 m)    Wt 92 kg    SpO2 96%    BMI 32.74 kg/m      PHYSICAL EXAM    GENERAL:NAD, no fevers, chills, no weakness no fatigue HEAD: Normocephalic, atraumatic.  EYES: Pupils equal, round, reactive to light.  Extraocular muscles intact. No scleral icterus.  MOUTH: Moist mucosal membrane. Dentition intact. No abscess noted.  EAR, NOSE, THROAT: Clear without exudates. No external lesions.  NECK: Supple. No thyromegaly. No nodules. No JVD.  PULMONARY: BIPAP lung sounds CARDIOVASCULAR: S1 and S2. Regular rate and rhythm. No murmurs, rubs, or gallops. No edema. Pedal pulses 2+ bilaterally.  GASTROINTESTINAL: Soft, nontender, nondistended. No masses. Positive bowel sounds. No hepatosplenomegaly.  MUSCULOSKELETAL: No swelling, clubbing, or edema. Range of motion full in all extremities.  NEUROLOGIC: Cranial nerves II through XII are intact. No gross focal neurological deficits. Sensation intact. Reflexes intact.  SKIN: No ulceration, lesions, rashes, or cyanosis. Skin warm and dry. Turgor intact.  PSYCHIATRIC: severely anxious      IMAGING    CT HEAD WO CONTRAST (5MM)  Result Date: 06/08/2021 CLINICAL DATA:  Pneumonia, complication suspected. Malaise, weakness EXAM: CT HEAD WITHOUT CONTRAST TECHNIQUE: Contiguous axial images were obtained from the base of the skull through the vertex without intravenous contrast. COMPARISON:  Brain MRI 10/11/2016 FINDINGS: Brain: There is no evidence of acute intracranial hemorrhage, extra-axial fluid collection, or acute infarct. There is an area of encephalomalacia in the left temporoparietal region consistent with remote infarct, new since the prior brain MRI from 2018. There is an additional small remote lacunar infarct in the right cerebellar hemisphere. Background parenchymal volume is normal. The ventricles are normal in size. Scattered foci of hypodensity in the remainder of the subcortical and periventricular white matter likely reflects sequela of chronic white matter microangiopathy. A small focus of calcification along the falx likely corresponds to the meningioma seen on the prior brain MRI from 2018. There is no other solid mass lesion. There is no midline  shift. Vascular: There is calcification of the bilateral cavernous ICAs. Skull: Normal. Negative for fracture or focal lesion. Sinuses/Orbits: The paranasal sinuses are clear. Bilateral lens implants are in place. The globes and orbits are otherwise unremarkable. Other: There is trace fluid in the mastoid air cells bilaterally. IMPRESSION: 1. 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 2. No evidence of acute intracranial pathology. 3. Trace bilateral mastoid effusions. Electronically Signed   By: Valetta Mole M.D.   On: 06/08/2021 14:57   CT CHEST WO CONTRAST  Result Date: 06/08/2021 CLINICAL DATA:  Malaise. Weakness. Dyspnea on exertion. Community-acquired pneumonia and COPD exacerbation. On IV antibiotics. Known aortic dilatation. Aortic valve repair. EXAM: CT CHEST WITHOUT CONTRAST TECHNIQUE: Multidetector CT imaging of the chest was performed following the standard protocol without IV contrast. COMPARISON:  Chest radiograph 1 day prior.  CTA chest 05/08/2021. FINDINGS: Moderate motion degradation. Cardiovascular: Aortic atherosclerosis. Tortuous thoracic aorta. Aortic valve repair. Moderate cardiomegaly, without pericardial effusion. Median sternotomy for CABG. Pulmonary artery enlargement, outflow tract 4.0 cm Mediastinum/Nodes: 1.9 cm right paratracheal node is similar to on the prior. 1.4 cm node within the azygoesophageal recess is unchanged. Hilar regions poorly evaluated without intravenous contrast. 12 mm prevascular node is unchanged. Lungs/Pleura: Trace left pleural fluid or thickening is similar. Significantly worsened aeration since 06/02/2021. Relatively diffuse ground-glass and less so airspace disease with areas of septal thickening. Relative sparing of the left apex and  superior segment left lower lobe. No areas of extraalveolar air.  No pneumothorax. Upper Abdomen: Caudate and lateral segment left liver lobe  enlargement. Normal imaged portions of the spleen, stomach, adrenal glands. Cholecystectomy. Musculoskeletal: No acute osseous abnormality. IMPRESSION: 1. Moderately motion degraded exam. 2. Progression of relatively diffuse airspace and ground-glass opacity. Given lack of significant pleural fluid, favor infection, including atypical etiologies. Recommend clinical exclusion of COVID-19. Pulmonary edema felt less likely. 3. Similar thoracic adenopathy, favored to be reactive. 4. Pulmonary artery enlargement suggests pulmonary arterial hypertension. 5.  Aortic Atherosclerosis (ICD10-I70.0). 6. Suspicion of mild cirrhosis.  Correlate with risk factors. Electronically Signed   By: Abigail Miyamoto M.D.   On: 06/08/2021 14:56   CT Angio Chest Pulmonary Embolism (PE) W or WO Contrast  Result Date: 06/04/2021 CLINICAL DATA:  Pulmonary embolism (PE) suspected, positive D-dimer EXAM: CT ANGIOGRAPHY CHEST WITH CONTRAST TECHNIQUE: Multidetector CT imaging of the chest was performed using the standard protocol during bolus administration of intravenous contrast. Multiplanar CT image reconstructions and MIPs were obtained to evaluate the vascular anatomy. CONTRAST:  27m OMNIPAQUE IOHEXOL 350 MG/ML SOLN COMPARISON:  02/01/2021 FINDINGS: Cardiovascular: Adequate opacification of the pulmonary arterial tree. No intraluminal filling defect identified to suggest acute pulmonary embolism. Central pulmonary arteries are enlarged in keeping with changes of pulmonary arterial hypertension, unchanged. Transcatheter aortic valve replacement has been performed. Multi-vessel coronary artery stenting has been performed as well as coronary artery bypass grafting. Cardiomegaly is present, stable. No pericardial effusion. Extensive atheromatous plaque is seen within the thoracic aorta demonstrating mural irregularity and numerous shallow ulcer like projections. No aortic aneurysm. Mediastinum/Nodes: The thyroid gland is absent or atretic. The  esophagus is unremarkable. There is interval enlargement of multiple pathologically enlarged lymph nodes within the right paratracheal and prevascular lymph node groups which measure up to 19 mm in short axis diameter. Numerous shotty bilateral hilar lymph nodes are also identified. Lungs/Pleura: Extensive bilateral asymmetric ground-glass pulmonary infiltrate has developed, most in keeping with atypical infection in the acute setting. No pneumothorax or pleural effusion. No central obstructing lesion. Upper Abdomen: No acute abnormality. Musculoskeletal: No acute bone abnormality. No lytic or blastic bone lesion. Review of the MIP images confirms the above findings. IMPRESSION: No pulmonary embolism. Extensive multifocal pulmonary infiltrate most in keeping with atypical infection in the acute setting. Stable cardiomegaly. Aortic Atherosclerosis (ICD10-I70.0). Electronically Signed   By: AFidela SalisburyM.D.   On: 06/04/2021 00:04   UKoreaVenous Img Lower Unilateral Left  Result Date: 05/21/2021 CLINICAL DATA:  Initial evaluation for acute swelling of left calf, evaluate for DVT. EXAM: Left LOWER EXTREMITY VENOUS DOPPLER ULTRASOUND TECHNIQUE: Gray-scale sonography with graded compression, as well as color Doppler and duplex ultrasound were performed to evaluate the lower extremity deep venous systems from the level of the common femoral vein and including the common femoral, femoral, profunda femoral, popliteal and calf veins including the posterior tibial, peroneal and gastrocnemius veins when visible. The superficial great saphenous vein was also interrogated. Spectral Doppler was utilized to evaluate flow at rest and with distal augmentation maneuvers in the common femoral, femoral and popliteal veins. COMPARISON:  None. FINDINGS: Contralateral Common Femoral Vein: Respiratory phasicity is normal and symmetric with the symptomatic side. No evidence of thrombus. Normal compressibility. Common Femoral Vein: No  evidence of thrombus. Normal compressibility, respiratory phasicity and response to augmentation. Saphenofemoral Junction: No evidence of thrombus. Normal compressibility and flow on color Doppler imaging. Profunda Femoral Vein: No evidence of thrombus. Normal compressibility and flow on  color Doppler imaging. Femoral Vein: No evidence of thrombus. Normal compressibility, respiratory phasicity and response to augmentation. Popliteal Vein: No evidence of thrombus. Normal compressibility, respiratory phasicity and response to augmentation. Calf Veins: No evidence of thrombus. Normal compressibility and flow on color Doppler imaging. Superficial Great Saphenous Vein: No evidence of thrombus. Normal compressibility. Venous Reflux:  None. Other Findings:  None. IMPRESSION: No evidence of deep venous thrombosis. Electronically Signed   By: Jeannine Boga M.D.   On: 05/16/2021 19:41   DG Chest Port 1 View  Result Date: 06/07/2021 CLINICAL DATA:  Reason for exam: SOB Hx of CHF, COPD, asthma, aortic valve repair, history of bioprosthetic aortic valve replacement failure with severe AS and resulting in TAVR on 04/20/2021, Rapid Response Nurse currently in room. EXAM: PORTABLE CHEST - 1 VIEW COMPARISON:  05/28/2021 FINDINGS: Interval progression of extensive bilateral asymmetric airspace opacities most confluent in the right mid and upper lung, and left lower lung, with enlarging focal opacity centrally in the left upper lung and worsening interstitial and airspace opacities at the right lung base. Heart size upper limits normal for technique. Previous AVR. CABG markers. Blunting of lateral costophrenic angles suggesting effusions. No pneumothorax. Sternotomy wires. IMPRESSION: 1. Worsening asymmetric extensive airspace infiltrates or edema. 2. Suspect small pleural effusions. Electronically Signed   By: Lucrezia Europe M.D.   On: 06/07/2021 07:51   DG Chest Port 1 View  Result Date: 05/29/2021 CLINICAL DATA:  Pt to  ED via [facility] from home for shortness of breath. Pt speaking in complete sentences stating that she cannot breath. EXAM: PORTABLE CHEST 1 VIEW COMPARISON:  05/21/2021 FINDINGS: Stable changes from prior cardiac surgery and aortic valve replacement. Cardiac silhouette is enlarged. No mediastinal or hilar masses. Bilateral interstitial and patchy hazy airspace lung opacities. Interstitial opacities are similar to the prior exam, airspace opacities appear new. No convincing pleural effusion.  No pneumothorax. Skeletal structures are grossly intact. IMPRESSION: 1. Interstitial and hazy airspace lung opacities. Congestive heart failure with pulmonary edema suspected. Multifocal pneumonia is also in the differential diagnosis. Electronically Signed   By: Lajean Manes M.D.   On: 05/27/2021 18:42   DG Chest Portable 1 View  Result Date: 05/21/2021 CLINICAL DATA:  Shortness of breath and heart racing. EXAM: PORTABLE CHEST 1 VIEW COMPARISON:  April 30, 2021 FINDINGS: Multiple sternal wires are noted. Stable, diffuse, chronic appearing increased interstitial lung markings are seen. There is no evidence of focal consolidation, pleural effusion or pneumothorax. The cardiac silhouette is enlarged and unchanged in size. An artificial aortic valve is seen. The visualized skeletal structures are unremarkable. IMPRESSION: Stable cardiomegaly and evidence of prior median sternotomy/CABG, without acute cardiopulmonary disease. Electronically Signed   By: Virgina Norfolk M.D.   On: 05/21/2021 02:40   DG Knee Complete 4 Views Left  Result Date: 05/27/2021 CLINICAL DATA:  Pain, swelling, bruising.  Fall. EXAM: LEFT KNEE - COMPLETE 4+ VIEW COMPARISON:  None. FINDINGS: Anterior soft tissue swelling. No acute bony abnormality. Specifically, no fracture, subluxation, or dislocation. No joint effusion. IMPRESSION: Anterior soft tissue swelling.  No acute bony abnormality. Electronically Signed   By: Rolm Baptise M.D.   On:  05/27/2021 05:28   ECHOCARDIOGRAM COMPLETE  Result Date: 06/04/2021    ECHOCARDIOGRAM REPORT   Patient Name:   [name] Date of Exam: 06/04/2021 Medical Rec #:  [id number]     Height:       66.0 in Accession #:    [id number]    Weight:  208.8 lb Date of Birth:  [birth year]      BSA:          2.037 m Patient Age:    57 years      BP:           151/77 mmHg Patient Gender: F             HR:           86 bpm. Exam Location:  [facility] Procedure: 2D Echo and Intracardiac Opacification Agent Indications:     Dyspnea R06.00  History:         Patient has prior history of Echocardiogram examinations, most                  recent 05/17/2021.                  Aortic Valve: bioprosthetic valve is present in the aortic                  position.  Sonographer:     Kathlen Brunswick RDCS Referring Phys:  [id number] AMY N COX Diagnosing Phys: Kate Sable MD  Sonographer Comments: Technically challenging study due to limited acoustic windows, Technically difficult study due to poor echo windows and no apical window. Image acquisition challenging due to patient body habitus. IMPRESSIONS  1. Left ventricular ejection fraction, by estimation, is 55 to 60%. The left ventricle has normal function. Left ventricular endocardial border not optimally defined to evaluate regional wall motion. There is moderate left ventricular hypertrophy. Left ventricular diastolic parameters are consistent with Grade II diastolic dysfunction (pseudonormalization).  2. Right ventricular systolic function is normal. The right ventricular size is not well visualized.  3. Left atrial size was mildly dilated.  4. A small pericardial effusion is present. The pericardial effusion is posterior and lateral to the left ventricle. There is no evidence of cardiac tamponade.  5. The mitral valve is degenerative. Trivial mitral valve regurgitation. Mild to moderate mitral stenosis. The mean mitral valve gradient is 8.0 mmHg.  6. The aortic valve has been  repaired/replaced. Aortic valve regurgitation is not visualized. There is a bioprosthetic valve present in the aortic position.  7. Aortic dilatation noted. There is mild dilatation of the ascending aorta, measuring 40 mm. FINDINGS  Left Ventricle: Left ventricular ejection fraction, by estimation, is 55 to 60%. The left ventricle has normal function. Left ventricular endocardial border not optimally defined to evaluate regional wall motion. Definity contrast agent was given IV to delineate the left ventricular endocardial borders. The left ventricular internal cavity size was normal in size. There is moderate left ventricular hypertrophy. Left ventricular diastolic parameters are consistent with Grade II diastolic dysfunction (pseudonormalization). Right Ventricle: The right ventricular size is not well visualized. No increase in right ventricular wall thickness. Right ventricular systolic function is normal. Left Atrium: Left atrial size was mildly dilated. Right Atrium: Right atrial size was not well visualized. Pericardium: A small pericardial effusion is present. The pericardial effusion is posterior and lateral to the left ventricle. There is no evidence of cardiac tamponade. Mitral Valve: The mitral valve is degenerative in appearance. Mild to moderate mitral annular calcification. Trivial mitral valve regurgitation. Mild to moderate mitral valve stenosis. MV peak gradient, 14.3 mmHg. The mean mitral valve gradient is 8.0 mmHg. Tricuspid Valve: The tricuspid valve is normal in structure. Tricuspid valve regurgitation is mild. Aortic Valve: The aortic valve has been repaired/replaced. Aortic valve regurgitation is not visualized. Aortic valve mean gradient  measures 2.0 mmHg. Aortic valve peak gradient measures 4.1 mmHg. Aortic valve area, by VTI measures 4.24 cm. There is a bioprosthetic valve present in the aortic position. Pulmonic Valve: The pulmonic valve was normal in structure. Pulmonic valve  regurgitation is mild. Aorta: Aortic dilatation noted. There is mild dilatation of the ascending aorta, measuring 40 mm. Venous: The inferior vena cava was not well visualized. IAS/Shunts: No atrial level shunt detected by color flow Doppler.  LEFT VENTRICLE PLAX 2D LVIDd:         4.55 cm      Diastology LVIDs:         3.15 cm      LV e' medial:    5.11 cm/s LV PW:         1.65 cm      LV E/e' medial:  31.3 LV IVS:        1.75 cm      LV e' lateral:   7.51 cm/s LVOT diam:     1.70 cm      LV E/e' lateral: 21.3 LV SV:         54 LV SV Index:   27 LVOT Area:     2.27 cm  LV Volumes (MOD) LV vol d, MOD A2C: 84.3 ml LV vol d, MOD A4C: 123.0 ml LV vol s, MOD A2C: 24.4 ml LV vol s, MOD A4C: 31.7 ml LV SV MOD A2C:     59.9 ml LV SV MOD A4C:     123.0 ml LV SV MOD BP:      79.1 ml LEFT ATRIUM           Index LA diam:      4.70 cm 2.31 cm/m LA Vol (A4C): 74.9 ml 36.77 ml/m  AORTIC VALVE                    PULMONIC VALVE AV Area (Vmax):    2.87 cm     PV Vmax:          1.32 m/s AV Area (Vmean):   2.86 cm     PV Peak grad:     7.0 mmHg AV Area (VTI):     4.24 cm     PR End Diast Vel: 6.66 msec AV Vmax:           101.35 cm/s AV Vmean:          64.200 cm/s AV VTI:            0.128 m AV Peak Grad:      4.1 mmHg AV Mean Grad:      2.0 mmHg LVOT Vmax:         128.00 cm/s LVOT Vmean:        80.900 cm/s LVOT VTI:          0.239 m LVOT/AV VTI ratio: 1.87  AORTA Ao Root diam: 2.90 cm Ao Asc diam:  4.00 cm MITRAL VALVE                TRICUSPID VALVE MV Area (PHT): 3.00 cm     TV Peak grad:   27.6 mmHg MV Area VTI:   1.02 cm     TV Vmax:        2.62 m/s MV Peak grad:  14.3 mmHg MV Mean grad:  8.0 mmHg     SHUNTS MV Vmax:       1.89 m/s     Systemic VTI:  0.24 m  MV Vmean:      136.0 cm/s   Systemic Diam: 1.70 cm MV Decel Time: 253 msec MV E velocity: 160.00 cm/s MV A velocity: 173.00 cm/s MV E/A ratio:  0.92 Kate Sable MD Electronically signed by Kate Sable MD Signature Date/Time: 06/04/2021/3:30:30 PM    Final     ECHOCARDIOGRAM COMPLETE  Result Date: 05/17/2021    ECHOCARDIOGRAM REPORT   Patient Name:   [name] Date of Exam: 05/17/2021 Medical Rec #:  [id number]     Height:       65.0 in Accession #:    [id number]    Weight:       215.0 lb Date of Birth:  [birth year]      BSA:          2.040 m Patient Age:    52 years      BP:           130/58 mmHg Patient Gender: F             HR:           85 bpm. Exam Location:  [facility] Procedure: 2D Echo, Cardiac Doppler and Color Doppler Indications:    Z95.2 Status post TAVR  History:        Patient has prior history of Echocardiogram examinations, most                 recent 04/19/2021. 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  Sonographer:    Cresenciano Lick RDCS Referring Phys: [phone] JILL D MCDANIEL IMPRESSIONS  1. Left ventricular ejection fraction, by estimation, is 55 to 60%. The left ventricle has normal function. The left ventricle has no regional wall motion abnormalities. There is moderate left ventricular hypertrophy. Left ventricular diastolic parameters are consistent with Grade I diastolic dysfunction (impaired relaxation).  2. Right ventricular systolic function is normal. The right ventricular size is normal. There is normal pulmonary artery systolic pressure. The estimated right ventricular systolic pressure is 73.4 mmHg.  3. Left atrial size was mildly dilated.  4. Right atrial size was mildly dilated.  5. The mitral valve is degenerative. No evidence of mitral valve regurgitation. Moderate to severe mitral stenosis. The mean mitral valve gradient is 9.0 mmHg with MVA by VTI 1.12 cm^2. Moderate mitral annular calcification with extensive valvular calcification.  6. Bioprosthetic aortic valve s/p TAVR. 23 mm Rossin Sapien THV. No perivalvular leakage noted. Mean gradient 33 mmHg is significantly elevated, dimensionless index 0.31. Would  suggest TEE or gated CTA to more closely evaluate bioprosthetic valve stenosis.  7. Aortic dilatation noted. There is mild dilatation of the ascending aorta, measuring 40 mm.  8. The inferior vena cava is normal in size with greater than 50% respiratory variability, suggesting right atrial pressure of 3 mmHg. FINDINGS  Left Ventricle: Left ventricular ejection fraction, by estimation, is 55 to 60%. The left ventricle has normal function. The left ventricle has no regional wall motion abnormalities. The left ventricular internal cavity size was normal in size. There is  moderate left ventricular hypertrophy. Left ventricular diastolic parameters are consistent with Grade I diastolic dysfunction (impaired relaxation). Right Ventricle: The right ventricular size is normal. No increase in right ventricular wall thickness. Right ventricular systolic function is normal.  There is normal pulmonary artery systolic pressure. The tricuspid regurgitant velocity is 2.55 m/s, and  with an assumed right atrial pressure of 3 mmHg, the estimated right ventricular systolic pressure is 41.9 mmHg. Left Atrium: Left atrial size was mildly dilated. Right Atrium: Right atrial size was mildly dilated. Pericardium: Trivial pericardial effusion is present. Mitral Valve: The mitral valve is degenerative in appearance. There is severe calcification of the mitral valve leaflet(s). Moderate mitral annular calcification. No evidence of mitral valve regurgitation. Moderate to severe mitral valve stenosis. MV peak gradient, 14.3 mmHg. The mean mitral valve gradient is 9.0 mmHg. Tricuspid Valve: The tricuspid valve is normal in structure. Tricuspid valve regurgitation is trivial. Aortic Valve: Bioprosthetic aortic valve s/p TAVR. 23 mm Nadal Sapien THV. No perivalvular leakage noted. Mean gradient 33 mmHg is significantly elevated, dimensionless index 0.31. Would suggest TEE or gated CTA to more closely evaluate bioprosthetic valve stenosis. The  aortic valve has been repaired/replaced. Aortic valve regurgitation is not visualized. Aortic valve mean gradient measures 33.0 mmHg. Aortic valve peak gradient measures 34.5 mmHg. Aortic valve area, by VTI measures 1.02 cm. Pulmonic Valve: The pulmonic valve was normal in structure. Pulmonic valve regurgitation is not visualized. Aorta: Aortic dilatation noted. There is mild dilatation of the ascending aorta, measuring 40 mm. Venous: The inferior vena cava is normal in size with greater than 50% respiratory variability, suggesting right atrial pressure of 3 mmHg. IAS/Shunts: No atrial level shunt detected by color flow Doppler.  LEFT VENTRICLE PLAX 2D LVIDd:         4.60 cm   Diastology LVIDs:         3.00 cm   LV e' medial:    6.09 cm/s LV PW:         1.20 cm   LV E/e' medial:  25.9 LV IVS:        1.20 cm   LV e' lateral:   7.62 cm/s LVOT diam:     1.90 cm   LV E/e' lateral: 20.7 LV SV:         57 LV SV Index:   28 LVOT Area:     2.84 cm  RIGHT VENTRICLE RV Basal diam:  4.00 cm RV S prime:     12.70 cm/s TAPSE (M-mode): 2.2 cm LEFT ATRIUM             Index        RIGHT ATRIUM           Index LA diam:        5.10 cm 2.50 cm/m   RA Area:     19.50 cm LA Vol (A2C):   86.4 ml 42.36 ml/m  RA Volume:   62.50 ml  30.64 ml/m LA Vol (A4C):   54.3 ml 26.62 ml/m LA Biplane Vol: 72.6 ml 35.59 ml/m  AORTIC VALVE AV Area (Vmax):    1.02 cm AV Area (Vmean):   0.95 cm AV Area (VTI):     1.02 cm AV Vmax:           293.80 cm/s AV Vmean:          218.200 cm/s AV VTI:            0.553 m AV Peak Grad:      34.5 mmHg AV Mean Grad:      33.0 mmHg LVOT Vmax:         105.50 cm/s LVOT Vmean:        73.000 cm/s LVOT VTI:  0.200 m LVOT/AV VTI ratio: 0.36  AORTA Ao Root diam: 3.50 cm Ao Asc diam:  4.00 cm MITRAL VALVE                TRICUSPID VALVE MV Area (PHT): 3.34 cm     TR Peak grad:   26.0 mmHg MV Area VTI:   1.12 cm     TR Vmax:        255.00 cm/s MV Peak grad:  14.3 mmHg MV Mean grad:  9.0 mmHg     SHUNTS MV Vmax:        1.89 m/s     Systemic VTI:  0.20 m MV Vmean:      145.5 cm/s   Systemic Diam: 1.90 cm MV VTI:        0.50 m MV Decel Time: 227 msec MV E velocity: 157.50 cm/s MV A velocity: 177.00 cm/s MV E/A ratio:  0.89 Dalton McleanMD Electronically signed by Franki Monte Signature Date/Time: 05/17/2021/6:15:59 PM    Final       ASSESSMENT/PLAN   Acute on chronic hypoxemic respiratory faiulre  -patient has severe multi vessel CAD based on cardiac cath few months ago  - acutely there is evidence of pulmonary edema on CT chest.  -she had episode of respiratory distress and I spoke to RN who takes care of her who explained this was panic attack and patient admits to severe anxiety. She responded well to precedex and confirts DNR/DNI status -patient does have moderate pulmonary hypertension on RHC -CTPE is negative for PE -will consider sildenafil TID for pulmonary hypertension once more diruresed   Moderate pulmonary hypertension   - sildenafil 20 TID post diuresis -due to group 3 copd  -severe and not responsive to typical therapy   Acute pulmonary edema   -recheck TTE  -patient on lasix but bp is soft unable to take anything PO    Acute exacerbation of COPD with chronic hypoxemia   - continue iv solumedrol 20 IV daily   GI/DVT ppx  Electrolyte derrangements - pharmacy consult   Critical care provider statement:   Total critical care time: 33 minutes   Performed by: Lanney Gins MD   Critical care time was exclusive of separately billable procedures and treating other patients.   Critical care was necessary to treat or prevent imminent or life-threatening deterioration.   Critical care was time spent personally by me on the following activities: development of treatment plan with patient and/or surrogate as well as nursing, discussions with consultants, evaluation of patient's response to treatment, examination of patient, obtaining history from patient or surrogate, ordering and  performing treatments and interventions, ordering and review of laboratory studies, ordering and review of radiographic studies, pulse oximetry and re-evaluation of patient's condition.    Ottie Glazier, M.D.  Pulmonary & Critical Care Medicine       Ottie Glazier, M.D.  Division of [facility]

## 2021-06-11 NOTE — Progress Notes (Signed)
Patient lethargic most of day. Gets anxious at times and has difficulty getting her oxygen levels to return to baseline. These episodes didn't last long until at shift change when she pulled her oxygen off and took approx 10 min for her sats to return to baseline. Dr. Tawanna Solo and Dr. Lanney Gins aware. Patient choked on nectar thick liquid given by NT/sitter and liquid from mouth swab provided by family. Family aware of patient condition and waiting for hospice placement.

## 2021-06-11 NOTE — Progress Notes (Signed)
PROGRESS NOTE    [name]  ZWC:[id number] DOB: 1945/09/26 DOA: 05/19/2021 PCP: Rusty Aus, MD   Chief Complain: Weakness, dyspnea  Brief Narrative: Patient is a 76 year old female with history of aortic valve repair/bioprosthetic aortic valve replacement with severe AS resulting in TAVR on 11/17, anxiety, depression, COPD, hypertension, OSA, coronary artery disease status post CABG who presented to the emergency department here on 12/31 with worsening malaise, generalized weakness, dyspnea.  She was found to be in acute hypoxic respiratory failure secondary to community-acquired pneumonia/COPD exacerbation.  She was started on IV antibiotics, IV steroids.  Hospital course remarkable for worsening respiratory status, had to transfer to stepdown and ICU and was on BiPAP.  She was also started on Precedex for persistent restlessness, agitation.  Extensive goals of care discussion with family, now the plan is to discharge her to residential hospice,waiting for bed.  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 On 3 L at home. On high flow oxygen: 50 L/min.  Chest x-ray had some diffuse bilateral infiltrates concerning  for viral etiology but COVID, flu remain negative. Antibiotics were broadened due to respiratory decompensation. Now on doxy. RSV negative.  CTA negative for PE.  She was also on BiPAP.  She has moderate pulmonary hypertension as per right heart catheterization. Also on IV steroids.  Acute diastolic congestive heart failure: BNP was elevated.  Increased pulmonary vascular disease on chest x-ray.  She is on  IV Lasix.  History of aortic valve stenosis: Recent history of TAVR.  Agitation/delirium: On Precedex.  PCCM following. She has history of chronic uncontrolled anxiety with frequent panic attacks.  Psychiatry was following here.  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  Symptomatic anemia/anemia of chronic disease: Has history of iron deficiency anemia.  FOBT negative.  She received a unit of PRBC here.  Type 2 diabetes: A1c of 6.2.  On sliding scale insulin  Hypothyroidism: On Synthyroid  Goals of care: Very poor prognosis.  PCCM following.  Palliative care also following.  After extensive discussion about goals of care with family, family had opted for residential hospice.  She is waiting for bed.  CODE STATUS is a DNR.  Family want to continue oxygen for now.             DVT prophylaxis:Lovenox Code Status: DNR Family Communication: None at bedside Patient status:Inpatient  Dispo: The patient is from: Home              Anticipated d/c is to: residential Hospice              Anticipated d/c  date is: as soon as bed is available  Consultants: PCCM, palliative care  Procedures: None  Antimicrobials:  Anti-infectives (From admission, onward)    Start     Dose/Rate Route Frequency Ordered Stop   06/08/21 1000  doxycycline (VIBRAMYCIN) 100 mg in sodium chloride 0.9 % 250 mL IVPB        100 mg 125 mL/hr over 120 Minutes Intravenous Every 12 hours  06/08/21 0144 06/11/21 2359   06/07/21 1800  doxycycline (VIBRAMYCIN) 100 mg in sodium chloride 0.9 % 250 mL IVPB  Status:  Discontinued        100 mg 125 mL/hr over 120 Minutes Intravenous Every 12 hours 06/07/21 1648 06/08/21 0144   06/07/21 1400  piperacillin-tazobactam (ZOSYN) IVPB 3.375 g  Status:  Discontinued        3.375 g 12.5 mL/hr over 240 Minutes Intravenous Every 8 hours 06/07/21 1112 06/07/21 1641   06/07/21 1000  meropenem (MERREM) 1 g in sodium chloride 0.9 % 100 mL IVPB  Status:  Discontinued        1 g 200 mL/hr over 30 Minutes Intravenous Every 12 hours 06/07/21 0814 06/07/21 1111   06/07/21 0845  linezolid (ZYVOX) IVPB 600 mg  Status:  Discontinued        600 mg 300 mL/hr over 60 Minutes Intravenous Every 12 hours 06/07/21 0757 06/07/21 1111   06/04/21 1000  nirmatrelvir/ritonavir EUA (renal dosing) (PAXLOVID) 2 tablet  Status:  Discontinued        2 tablet Oral 2 times daily 06/04/21 0809 06/04/21 0812   06/04/21 0030  azithromycin (ZITHROMAX) 500 mg in sodium chloride 0.9 % 250 mL IVPB  Status:  Discontinued       Note to Pharmacy: For atypical coverage. Thank you.   500 mg 250 mL/hr over 60 Minutes Intravenous Every 24 hours 06/04/21 0021 06/07/21 0759   06/04/21 0030  cefTRIAXone (ROCEPHIN) 2 g in sodium chloride 0.9 % 100 mL IVPB  Status:  Discontinued        2 g 200 mL/hr over 30 Minutes Intravenous Every 24 hours 06/04/21 0022 06/07/21 0759       Subjective:  Patient seen and examined at the bedside in ICU.  She was unresponsive, did not open her eyes, was moaning, struggling with  her breath.  She was on high flow oxygen.  Sitter at the bedside.   Objective: Vitals:   06/11/21 0600 06/11/21 0700 06/11/21 0726 06/11/21 0800  BP: (!) 145/83 115/87  (!) 134/105  Pulse: (!) 110 69 77 (!) 41  Resp: (!) 29 (!) 31 (!) 29 (!) 34  Temp:    98.2 F (36.8 C)  TempSrc:    Axillary  SpO2: (!) 73% (!) 89% 92% 96%  Weight:      Height:        Intake/Output  Summary (Last 24 hours) at 06/11/2021 1133 Last data filed at 06/11/2021 0829 Gross per 24 hour  Intake 1453.91 ml  Output 900 ml  Net 553.91 ml   Filed Weights   06/09/21 0440 06/10/21 0500 06/11/21 0500  Weight: 95.5 kg 95.5 kg 92 kg    Examination:  General exam: On high flow oxygen, on bed, unresponsive HEENT: Eyes closed Respiratory system: Bilateral rhonchi/wheezing  Cardiovascular system: S1 & S2 heard, RRR.  Gastrointestinal system: Abdomen is nondistended, soft and nontender. Central nervous system: Not Alert or oriented Extremities: trace bilateral lower extremity  edema, no clubbing ,no cyanosis Skin: No rashes, no ulcers,no icterus  Data Reviewed: I have personally reviewed following labs and imaging studies  CBC: Recent Labs  Lab 06/07/21 0349 06/08/21 0607 06/09/21 0619 06/10/21 0514 06/11/21 0349  WBC 18.0* 13.1* 11.2* 15.7* 10.8*  NEUTROABS  --  11.2*  --   --  8.9*  HGB 7.8* 7.2* 7.3* 8.3* 7.4*  HCT 24.4* 23.5* 24.0* 27.2* 24.5*  MCV 80.5 82.5 83.3 82.7 83.1  PLT 174 195 180 223 875   Basic Metabolic Panel: Recent Labs  Lab 06/07/21 0349 06/08/21 0607 06/09/21 0619 06/10/21 0514 06/11/21 0349  NA 132* 138 141 138 139  K 4.6 4.5 4.4 4.2 4.5  CL 98 99 104 103 106  CO2 27 27 26 27 25   GLUCOSE 250* 98 66* 124* 92  BUN 32* 40* 57* 51* 51*  CREATININE 1.49* 1.72* 1.42* 1.31* 1.24*  CALCIUM 7.9* 8.1* 8.6* 8.5* 8.6*  MG  --  2.3  --  2.2 2.1  PHOS  --  6.1* 7.5* 4.9* 5.3*   GFR: Estimated Creatinine Clearance: 44.1 mL/min (A) (by C-G formula based on SCr of 1.24 mg/dL (H)). Liver Function Tests: Recent Labs  Lab 06/08/21 0607 06/11/21 0349  AST 21 17  ALT 16 15  ALKPHOS 129* 109  BILITOT 0.9 0.7  PROT 6.2* 5.9*  ALBUMIN 2.6* 2.5*   No results for input(s): LIPASE, AMYLASE in the last 168 hours. No results for input(s): AMMONIA in the last 168 hours. Coagulation Profile: No results for input(s): INR, PROTIME in the last 168  hours. Cardiac Enzymes: No results for input(s): CKTOTAL, CKMB, CKMBINDEX, TROPONINI in the last 168 hours. BNP (last 3 results) No results for input(s): PROBNP in the last 8760 hours. HbA1C: No results for input(s): HGBA1C in the last 72 hours. CBG: Recent Labs  Lab 06/10/21 0734 06/10/21 1159 06/10/21 1624 06/10/21 2115 06/11/21 0728  GLUCAP 93 109* 149* 169* 119*   Lipid Profile: No results for input(s): CHOL, HDL, LDLCALC, TRIG, CHOLHDL, LDLDIRECT in the last 72 hours. Thyroid Function Tests: No results for input(s): TSH, T4TOTAL, FREET4, T3FREE, THYROIDAB in the last 72 hours. Anemia Panel: No results for input(s): VITAMINB12, FOLATE, FERRITIN, TIBC, IRON, RETICCTPCT in the last 72 hours. Sepsis Labs: Recent Labs  Lab 06/07/21 0843 06/08/21 0607 06/09/21 0619 06/11/21 0349  PROCALCITON 1.09 2.14 1.46 0.64  LATICACIDVEN 1.6 0.8  --   --     Recent Results (from the past 240 hour(s))  Resp Panel by RT-PCR (Flu A&B, Covid) Nasopharyngeal Swab     Status: None   Collection Time: 06/04/21  8:32 AM   Specimen: Nasopharyngeal Swab; Nasopharyngeal(NP) swabs in vial transport medium  Result Value Ref Range Status   SARS Coronavirus 2 by RT PCR NEGATIVE NEGATIVE Final    Comment: (NOTE) SARS-CoV-2 target nucleic acids are NOT DETECTED.  The SARS-CoV-2 RNA is generally detectable in upper respiratory specimens during the acute phase of infection. The lowest concentration of SARS-CoV-2 viral copies this assay can detect is 138 copies/mL. A negative result does not preclude SARS-Cov-2 infection and should not be used as the sole basis for treatment or other patient management decisions. A negative result may occur with  improper specimen collection/handling, submission of specimen other than nasopharyngeal swab, presence of viral mutation(s) within the areas targeted by this assay, and inadequate number of viral copies(<138 copies/mL). A negative result must be combined  with clinical observations, patient history, and epidemiological information. The expected result is Negative.  Fact Sheet for Patients:  EntrepreneurPulse.com.au  Fact Sheet  for Healthcare Providers:  IncredibleEmployment.be  This test is no t yet approved or cleared by the [place] and  has been authorized for detection and/or diagnosis of SARS-CoV-2 by FDA under an Emergency Use Authorization (EUA). This EUA will remain  in effect (meaning this test can be used) for the duration of the COVID-19 declaration under Section 564(b)(1) of the Act, 21 U.S.C.section 360bbb-3(b)(1), unless the authorization is terminated  or revoked sooner.       Influenza A by PCR NEGATIVE NEGATIVE Final   Influenza B by PCR NEGATIVE NEGATIVE Final    Comment: (NOTE) The Xpert Xpress SARS-CoV-2/FLU/RSV plus assay is intended as an aid in the diagnosis of influenza from Nasopharyngeal swab specimens and should not be used as a sole basis for treatment. Nasal washings and aspirates are unacceptable for Xpert Xpress SARS-CoV-2/FLU/RSV testing.  Fact Sheet for Patients: EntrepreneurPulse.com.au  Fact Sheet for Healthcare Providers: IncredibleEmployment.be  This test is not yet approved or cleared by the [place] FDA and has been authorized for detection and/or diagnosis of SARS-CoV-2 by FDA under an Emergency Use Authorization (EUA). This EUA will remain in effect (meaning this test can be used) for the duration of the COVID-19 declaration under Section 564(b)(1) of the Act, 21 U.S.C. section 360bbb-3(b)(1), unless the authorization is terminated or revoked.  Performed at [facility], [address] [place], [place] [address]   MRSA Next Gen by PCR, Nasal     Status: None   Collection Time: 06/07/21  8:00 AM   Specimen: Nasal Mucosa; Nasal Swab  Result Value Ref Range Status   MRSA by PCR Next Gen  NOT DETECTED NOT DETECTED Final    Comment: (NOTE) The GeneXpert MRSA Assay (FDA approved for NASAL specimens only), is one component of a comprehensive MRSA colonization surveillance program. It is not intended to diagnose MRSA infection nor to guide or monitor treatment for MRSA infections. Test performance is not FDA approved in patients less than 35 years old. Performed at [facility], [place], [place] [address]   Respiratory (~20 pathogens) panel by PCR     Status: None   Collection Time: 06/07/21  1:51 PM   Specimen: Nasopharyngeal Swab; Respiratory  Result Value Ref Range Status   Adenovirus NOT DETECTED NOT DETECTED Final   Coronavirus 229E NOT DETECTED NOT DETECTED Final    Comment: (NOTE) The Coronavirus on the Respiratory Panel, DOES NOT test for the novel  Coronavirus (2019 nCoV)    Coronavirus HKU1 NOT DETECTED NOT DETECTED Final   Coronavirus NL63 NOT DETECTED NOT DETECTED Final   Coronavirus OC43 NOT DETECTED NOT DETECTED Final   Metapneumovirus NOT DETECTED NOT DETECTED Final   Rhinovirus / Enterovirus NOT DETECTED NOT DETECTED Final   Influenza A NOT DETECTED NOT DETECTED Final   Influenza B NOT DETECTED NOT DETECTED Final   Parainfluenza Virus 1 NOT DETECTED NOT DETECTED Final   Parainfluenza Virus 2 NOT DETECTED NOT DETECTED Final   Parainfluenza Virus 3 NOT DETECTED NOT DETECTED Final   Parainfluenza Virus 4 NOT DETECTED NOT DETECTED Final   Respiratory Syncytial Virus NOT DETECTED NOT DETECTED Final   Bordetella pertussis NOT DETECTED NOT DETECTED Final   Bordetella Parapertussis NOT DETECTED NOT DETECTED Final   Chlamydophila pneumoniae NOT DETECTED NOT DETECTED Final   Mycoplasma pneumoniae NOT DETECTED NOT DETECTED Final    Comment: Performed at [facility], [place]. [address], [place], [place] [address]  Radiology Studies: No results found.      Scheduled Meds:  azelastine  1 spray Each Nare BID    Chlorhexidine Gluconate Cloth  6 each Topical Daily   clopidogrel  75 mg Oral Daily   DULoxetine  20 mg Oral Q breakfast   enoxaparin (LOVENOX) injection  0.5 mg/kg Subcutaneous Q24H   feeding supplement (NEPRO CARB STEADY)  237 mL Oral BID BM   FLUoxetine  20 mg Oral BID   furosemide  40 mg Intravenous Daily   insulin aspart  0-15 Units Subcutaneous TID WC   insulin aspart  0-5 Units Subcutaneous QHS   insulin glargine-yfgn  50 Units Subcutaneous QHS   levothyroxine  125 mcg Oral Q0600   mouth rinse  15 mL Mouth Rinse q12n4p   methylPREDNISolone (SOLU-MEDROL) injection  20 mg Intravenous Daily   multivitamin with minerals  1 tablet Oral Daily   pantoprazole (PROTONIX) IV  40 mg Intravenous Daily   Continuous Infusions:  sodium chloride Stopped (06/09/21 0938)   dexmedetomidine (PRECEDEX) IV infusion 1.5 mcg/kg/hr (06/11/21 0940)   doxycycline (VIBRAMYCIN) IV 100 mg (06/11/21 1113)     LOS: 7 days    Time spent: 35 mins.More than 50% of that time was spent in counseling and/or coordination of care.      Shelly Coss, MD [facility] P1/01/2022, 11:33 AM

## 2021-06-11 NOTE — Progress Notes (Signed)
SLP Cancellation Note  Patient Details Name: [name] MRN: [id number] DOB: [birth year]   Cancelled treatment:       Reason Eval/Treat Not Completed: Medical issues which prohibited therapy   Spoke with RN. Pt not appropriate for SLP services at this time due to tachypnea, increased WOB, and lethargy. [place] discussions ongoing. SLP to defer efforts at this time. SLP to continue to f/u as appropriate. RN aware and in agreement.   Cherrie Gauze, M.S., [facility] [phone] [place])  Quintella Baton 06/11/2021, 9:52 AM

## 2021-06-12 DIAGNOSIS — J439 Emphysema, unspecified: Secondary | ICD-10-CM

## 2021-06-12 DIAGNOSIS — J449 Chronic obstructive pulmonary disease, unspecified: Secondary | ICD-10-CM | POA: Diagnosis not present

## 2021-06-12 DIAGNOSIS — I359 Nonrheumatic aortic valve disorder, unspecified: Secondary | ICD-10-CM | POA: Diagnosis not present

## 2021-06-12 DIAGNOSIS — Z66 Do not resuscitate: Secondary | ICD-10-CM | POA: Diagnosis not present

## 2021-06-12 DIAGNOSIS — J441 Chronic obstructive pulmonary disease with (acute) exacerbation: Secondary | ICD-10-CM | POA: Diagnosis not present

## 2021-06-12 LAB — CBC
HCT: 27.4 % — ABNORMAL LOW (ref 36.0–46.0)
Hemoglobin: 8 g/dL — ABNORMAL LOW (ref 12.0–15.0)
MCH: 24.4 pg — ABNORMAL LOW (ref 26.0–34.0)
MCHC: 29.2 g/dL — ABNORMAL LOW (ref 30.0–36.0)
MCV: 83.5 fL (ref 80.0–100.0)
Platelets: 221 10*3/uL (ref 150–400)
RBC: 3.28 MIL/uL — ABNORMAL LOW (ref 3.87–5.11)
RDW: 16.2 % — ABNORMAL HIGH (ref 11.5–15.5)
WBC: 22.6 10*3/uL — ABNORMAL HIGH (ref 4.0–10.5)
nRBC: 0 % (ref 0.0–0.2)

## 2021-06-12 LAB — BASIC METABOLIC PANEL
Anion gap: 9 (ref 5–15)
BUN: 62 mg/dL — ABNORMAL HIGH (ref 8–23)
CO2: 26 mmol/L (ref 22–32)
Calcium: 8.8 mg/dL — ABNORMAL LOW (ref 8.9–10.3)
Chloride: 109 mmol/L (ref 98–111)
Creatinine, Ser: 1.6 mg/dL — ABNORMAL HIGH (ref 0.44–1.00)
GFR, Estimated: 33 mL/min — ABNORMAL LOW (ref >60)
Glucose, Bld: 95 mg/dL (ref 70–99)
Potassium: 4.8 mmol/L (ref 3.5–5.1)
Sodium: 144 mmol/L (ref 135–145)

## 2021-06-12 LAB — PHOSPHORUS: Phosphorus: 6.1 mg/dL — ABNORMAL HIGH (ref 2.5–4.6)

## 2021-06-12 LAB — GLUCOSE, CAPILLARY: Glucose-Capillary: 79 mg/dL (ref 70–99)

## 2021-06-12 LAB — MAGNESIUM: Magnesium: 2.4 mg/dL (ref 1.7–2.4)

## 2021-06-12 MED ORDER — HYDROMORPHONE HCL 1 MG/ML IJ SOLN
2.0000 mg | Freq: Once | INTRAMUSCULAR | Status: AC
  Administered 2021-06-12: 2 mg via INTRAVENOUS

## 2021-06-12 MED ORDER — SODIUM CHLORIDE 0.9 % IV SOLN
2.5000 mg/h | INTRAVENOUS | Status: DC
  Filled 2021-06-12: qty 2.5

## 2021-06-12 MED ORDER — LORAZEPAM 2 MG/ML IJ SOLN: 1.0000 mg | INTRAMUSCULAR | Status: DC | PRN

## 2021-06-12 MED ORDER — LORAZEPAM 2 MG/ML IJ SOLN
INTRAMUSCULAR | Status: AC
  Filled 2021-06-12: qty 1

## 2021-06-12 MED ORDER — MIDAZOLAM HCL 2 MG/2ML IJ SOLN
INTRAMUSCULAR | Status: AC
  Administered 2021-06-12: 2 mg
  Filled 2021-06-12: qty 2

## 2021-06-12 MED ORDER — MIDAZOLAM HCL 2 MG/2ML IJ SOLN
2.0000 mg | INTRAMUSCULAR | Status: DC | PRN
  Administered 2021-06-12: 2 mg via INTRAVENOUS
  Filled 2021-06-12: qty 2

## 2021-06-12 MED ORDER — MIDAZOLAM HCL 2 MG/2ML IJ SOLN: 2.0000 mg | Freq: Once | INTRAMUSCULAR | Status: AC

## 2021-06-12 MED ORDER — MIDAZOLAM-SODIUM CHLORIDE 100-0.9 MG/100ML-% IV SOLN
2.0000 mg/h | INTRAVENOUS | Status: DC
  Administered 2021-06-12: 2 mg/h via INTRAVENOUS
  Filled 2021-06-12: qty 100

## 2021-06-12 MED ORDER — MIDAZOLAM BOLUS VIA INFUSION
1.0000 mg | INTRAVENOUS | Status: DC | PRN
  Filled 2021-06-12: qty 1

## 2021-06-12 MED ORDER — HYDROMORPHONE BOLUS VIA INFUSION
2.0000 mg | INTRAVENOUS | Status: DC | PRN
  Filled 2021-06-12: qty 2

## 2021-06-12 MED ORDER — ACETAMINOPHEN 650 MG RE SUPP: 650.0000 mg | Freq: Four times a day (QID) | RECTAL | Status: DC | PRN

## 2021-06-12 MED ORDER — FENTANYL CITRATE PF 50 MCG/ML IJ SOSY: 50.0000 ug | PREFILLED_SYRINGE | Freq: Once | INTRAMUSCULAR | Status: AC

## 2021-06-12 MED ORDER — ACETAMINOPHEN 325 MG PO TABS: 650.0000 mg | ORAL_TABLET | Freq: Four times a day (QID) | ORAL | Status: DC | PRN

## 2021-06-12 MED ORDER — MORPHINE 100MG IN NS 100ML (1MG/ML) PREMIX INFUSION
10.0000 mg/h | INTRAVENOUS | Status: DC
  Administered 2021-06-12: 3 mg/h via INTRAVENOUS
  Filled 2021-06-12: qty 100

## 2021-06-12 MED ORDER — FENTANYL CITRATE PF 50 MCG/ML IJ SOSY
PREFILLED_SYRINGE | INTRAMUSCULAR | Status: AC
  Administered 2021-06-12: 50 ug
  Filled 2021-06-12: qty 1

## 2021-06-12 MED ORDER — HYDROMORPHONE HCL 1 MG/ML IJ SOLN
INTRAMUSCULAR | Status: AC
  Filled 2021-06-12: qty 2

## 2021-06-12 MED ORDER — BIOTENE DRY MOUTH MT LIQD: 15.0000 mL | OROMUCOSAL | Status: DC | PRN

## 2021-06-12 MED ORDER — GLYCOPYRROLATE 0.2 MG/ML IJ SOLN: 0.2000 mg | INTRAMUSCULAR | Status: DC | PRN

## 2021-06-12 MED ORDER — DIAZEPAM 5 MG/ML IJ SOLN: 5.0000 mg | Freq: Four times a day (QID) | INTRAMUSCULAR | Status: DC | PRN

## 2021-06-12 MED ORDER — GLYCOPYRROLATE 1 MG PO TABS
1.0000 mg | ORAL_TABLET | ORAL | Status: DC | PRN
  Filled 2021-06-12: qty 1

## 2021-06-12 MED ORDER — LORAZEPAM 1 MG PO TABS: 1.0000 mg | ORAL_TABLET | ORAL | Status: DC | PRN

## 2021-06-12 MED ORDER — LORAZEPAM 2 MG/ML PO CONC: 1.0000 mg | ORAL | Status: DC | PRN

## 2021-06-12 MED ORDER — POLYVINYL ALCOHOL 1.4 % OP SOLN
1.0000 [drp] | Freq: Four times a day (QID) | OPHTHALMIC | Status: DC | PRN
  Filled 2021-06-12: qty 15

## 2021-06-12 MED ORDER — LORAZEPAM 2 MG/ML IJ SOLN: 2.0000 mg/h | INTRAVENOUS | Status: DC

## 2021-06-12 MED ORDER — MORPHINE BOLUS VIA INFUSION
5.0000 mg | INTRAVENOUS | Status: DC | PRN
  Administered 2021-06-12 (×10): 5 mg via INTRAVENOUS
  Filled 2021-06-12: qty 5

## 2021-06-15 ENCOUNTER — Other Ambulatory Visit: Payer: Medicare HMO

## 2021-06-16 ENCOUNTER — Ambulatory Visit: Payer: Medicare HMO | Admitting: Oncology

## 2021-06-16 ENCOUNTER — Ambulatory Visit: Payer: Medicare HMO

## 2021-06-19 ENCOUNTER — Ambulatory Visit: Payer: Medicare HMO | Admitting: Nurse Practitioner

## 2021-06-19 ENCOUNTER — Ambulatory Visit: Payer: Medicare HMO

## 2021-06-19 ENCOUNTER — Ambulatory Visit: Payer: Medicare HMO | Admitting: Oncology

## 2021-07-05 NOTE — Progress Notes (Signed)
GOALS OF CARE DISCUSSION  The Clinical status was relayed to family in detail. Family at bedside  Updated and notified of patients medical condition.   Patient remains unresponsive and will not open eyes to command.   Patient is having a weak cough and struggling to remove secretions.   Patient with increased WOB and using accessory muscles to breathe Explained to family course of therapy and the modalities    Patient with Progressive multiorgan failure with a very high probablity of a very minimal chance of meaningful recovery despite all aggressive and optimal medical therapy.  Family understands the situation.  They have consented and agreed to DNR/DNI and would like to proceed with Comfort care measures.  Family are satisfied with Plan of action and management. All questions answered  Additional CC time 35 mins   Henslee Lottman Patricia Pesa, M.D.  [facility]  Medical Director [facility] Director [facility] Cardio-Pulmonary Department

## 2021-07-05 NOTE — Progress Notes (Signed)
Chaplain Maggie offered support soon after pt was put on comfort care. Prayer, compassionate presence and hospitality were central to this visit. Family is at the bedside and others may be on the way. Continued support available per on call chaplain.

## 2021-07-05 NOTE — Progress Notes (Addendum)
PT Cancellation Note  Patient Details Name: [name] MRN: [id number] DOB: [birth year]   Cancelled Treatment:    Reason Eval/Treat Not Completed: Other (comment) Per MD, ok to sign off at this time as pt is not medically appropriate for PT. Please re-consult if status changes. Thank you.   Lavone Nian, PT, DPT 2021/07/06, 8:13 AM   [name] 07/06/2021, 8:13 AM

## 2021-07-05 NOTE — Progress Notes (Signed)
SLP Cancellation Note  Patient Details Name: [name] MRN: [id number] DOB: [birth year]   Cancelled treatment:       Reason Eval/Treat Not Completed: Medical issues which prohibited therapy.   Spoke with RN, Magda Paganini. RN noted pt not appropriate for SLP intervention or PO trials at this time given respiratory status. RN stated pt now NPO. Per SLP observation, pt on 60L/min O2 via HFNC. Tachypnea and increased WOB noted at rest. Pt with pending d/c to Hospice.  SLP will follow up x1, as appropriate.   Cherrie Gauze, M.S., [facility] [phone] [place])  Quintella Baton 06/28/2021, 9:39 AM

## 2021-07-05 NOTE — Progress Notes (Signed)
SLP Cancellation Note  Patient Details Name: [name] MRN: [id number] DOB: Jun 07, 1945   Cancelled treatment:       Reason Eval/Treat Not Completed:  (Noted SLP consult d/c'd by provider. Per chart review, pt's GOC are transitioning for comfort. SLP to sign off at this time. Should additional SLP needs arise, please consider SLP consultation.)  Cherrie Gauze, M.S., [facility] [phone] [address])   Quintella Baton July 04, 2021, 12:09 PM

## 2021-07-05 NOTE — Progress Notes (Signed)
Manufacturing engineer [facility]) Hospital Liaison note:  Follow up visit made to new referral for [facility] hospice home. Patient has been made full comfort care this morning and per conversation with ICU attending Dr. Mortimer Fries and Palliative NP Asencion Gowda, a hospital death is now expected. Referral notified and referral closed. Please call with any hospice related questions.   Flo Shanks BSN, RN, [place] [phone]

## 2021-07-05 NOTE — Progress Notes (Signed)
Husband Air cabin crew) at bedside, per his request I took off her wedding ring and gave it to him. Chris (NT) present and witnessed

## 2021-07-05 NOTE — Progress Notes (Addendum)
Daily Progress Note   Patient Name: [name]       Date: 07-02-2021 DOB: 1945-12-21  Age: 76 y.o. MRN#: [id number] Attending Physician: Shelly Coss, MD Primary Care Physician: Rusty Aus, MD Admit Date: 05/19/2021  Reason for Consultation/Follow-up: Establishing goals of care  Subjective: Patient is sitting in bed on high flow cannula. She is moaning, yelling "help me", and very restless. She has Precedex infusion in place, as well as mittens in place, and a sitter. Husband and one of her sons are at bedside. They state she has "been going down hill for years" and we discuss hospitalizations.   Husband tells me they have discussed shifting to comfort care. Discussed her current suffering. Discussed comfort care and the dying process. Discussed hospital death. Per staff husband has discussed concerns for giving too much medication in comfort care. Discussed with husband she will have what is needed for her to be comfortable. He states he does not know if he can be here for her death- discussed that he can be here as he wishes. They state they have family that will need to come/ e visit prior to shifting. RN aware.   Called back into bedside as I completed this note, family would like to shift to comfort care. Reviewed comfort care. Dr. Tawanna Solo in to speak with family and was part of conversation as well as Dr. Mortimer Fries. Comfort orders placed.   I completed a MOST form today with husband at bedside. Other family present as well. The signed original was placed in the chart. A photocopy was placed in the chart to be scanned into EMR. The patient outlined their wishes for the following treatment decisions:  Cardiopulmonary Resuscitation: Do Not Attempt Resuscitation (DNR/No CPR)  Medical  Interventions: Comfort Measures: Keep clean, warm, and dry. Use medication by any route, positioning, wound care, and other measures to relieve pain and suffering. Use oxygen, suction and manual treatment of airway obstruction as needed for comfort. Do not transfer to the hospital unless comfort needs cannot be met in current location.  Antibiotics: No antibiotics (use other measures to relieve symptoms)  IV Fluids: No IV fluids (provide other measures to ensure comfort)  Feeding Tube: No feeding tube      ADDENDUM: Returned to check on patient. Patient appears uncomfortable. Nurse advises she  has given PRN medication and she is working to discontinue Precedex infusion and removed patient from high flow. Morphine infusion increased. PRN boluses remain. PRN Versed given. Patient continues to moan and be agitated.  Remained at bedside or just outside of the room. RN remained at bedside with other RN's assisting. Patient appears uncomfortable. Precedex infusion was continued. Morphine infusion increased with boluses given. Patient moaning and very uncomfortable. Versed PRN given. Versed infusion ordered with boluses PRN. Morphine to 98m/hr and patient continues to moan and is agitated. PRN dose of Dilaudid given. Morphine infusion ordered changed to Dilaudid for better symptom management. Prior to hanging Dilaudid, patient was peaceful and comfortable. Patient left on Morphine and Versed infusion. Patient was comfortable with family at bedside. They state she has been suffering and struggling for a long time. They are pleased to see her resting comfortably and peacefully. Chaplain to bedside. Patient continues to be comfortable. Patient died.       Length of Stay: 8  Current Medications: Scheduled Meds:   azelastine  1 spray Each Nare BID   Chlorhexidine Gluconate Cloth  6 each Topical Daily   clopidogrel  75 mg Oral Daily   DULoxetine  20 mg Oral Q breakfast   enoxaparin (LOVENOX) injection  0.5  mg/kg Subcutaneous Q24H   feeding supplement (NEPRO CARB STEADY)  237 mL Oral BID BM   FLUoxetine  20 mg Oral BID   furosemide  40 mg Intravenous Daily   insulin aspart  0-15 Units Subcutaneous TID WC   insulin aspart  0-5 Units Subcutaneous QHS   insulin glargine-yfgn  50 Units Subcutaneous QHS   levothyroxine  125 mcg Oral Q0600   mouth rinse  15 mL Mouth Rinse q12n4p   methylPREDNISolone (SOLU-MEDROL) injection  20 mg Intravenous Daily   multivitamin with minerals  1 tablet Oral Daily   pantoprazole (PROTONIX) IV  40 mg Intravenous Daily    Continuous Infusions:  sodium chloride Stopped (06/09/21 0938)   dexmedetomidine (PRECEDEX) IV infusion 1.5 mcg/kg/hr (02023/01/220743)    PRN Meds: albuterol, diazepam, food thickener, LORazepam, melatonin, morphine injection, nitroGLYCERIN, ondansetron **OR** ondansetron (ZOFRAN) IV  Physical Exam Constitutional:      Comments: Eyes closed. Restless. Moaning  Pulmonary:     Comments: On high flow cannula Skin:    Comments: Bottom of feet and toes are mottled.             Vital Signs: BP 113/61 (BP Location: Right Arm)    Pulse 80    Temp 98.6 F (37 C) (Oral)    Resp (!) 28    Ht '5\' 6"'  (1.676 m)    Wt 95.4 kg    SpO2 95%    BMI 33.95 kg/m  SpO2: SpO2: 95 % O2 Device: O2 Device: High Flow Nasal Cannula O2 Flow Rate: O2 Flow Rate (L/min): 60 L/min  Intake/output summary:  Intake/Output Summary (Last 24 hours) at [id number] Last data filed at [id number] Gross per 24 hour  Intake 1142.35 ml  Output 850 ml  Net 292.35 ml   LBM: Last BM Date: 06/09/21 Baseline Weight: Weight: 98.1 kg Most recent weight: Weight: 95.4 kg   Patient Active Problem List   Diagnosis Date Noted   Palliative care by specialist    DNR (do not resuscitate)    Goals of care, counseling/discussion    Shortness of breath 05/25/2021   Severe aortic stenosis 04/18/2021   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  Acute on chronic diastolic (congestive) heart failure (HCC) 11/24/2020   SOB (shortness of breath) 11/24/2020   Acute hyponatremia 11/24/2020   Prolonged QT interval 11/24/2020   Angina pectoris ([place]) 11/03/2020   CHF exacerbation ([place]) 09/07/2020   Elevated troponin 09/07/2020   Chest pain 08/09/2020   Acute on chronic diastolic CHF (congestive heart failure) ([place]) 08/08/2020   Anemia due to stage 3a chronic kidney disease ([place]) 05/21/2019   IDA (iron deficiency anemia) 02/03/2019   COPD exacerbation ([place]) 12/28/2017   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   Aortic ectasia, abdominal ([place]) 01/01/2017   GAD (generalized anxiety disorder) 09/26/2016   Acute posthemorrhagic anemia 08/24/2016   Acute respiratory failure with hypoxia (HCC) 24/26/8341   Acute diastolic CHF (congestive heart failure) ([place]) 08/24/2016   Hyponatremia 08/24/2016   Leukocytosis 08/24/2016   Fever 08/24/2016   Diarrhea 08/24/2016   Generalized weakness 08/24/2016   Acute diverticulitis 08/16/2016   Gastrointestinal hemorrhage    Ataxia 08/09/2016   Dizziness 08/09/2016   Staggering gait 08/09/2016   AVB (atrioventricular block)    Atypical chest pain    Unstable angina ([place]) 05/25/2016   Palpitations 05/25/2016   PUD - recent Rx for H.Pylori 05/25/2016   Stenosis of coronary artery stent    Sialadenitis 04/25/2015   Dyslipidemia-statin ontol    Long-term insulin use ([place]) 03/31/2014   Aortic valve disorder 11/11/2013   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 10/12/2013   Coronary Artery Disease s/p CABG 10/2013 10/10/2013   Other malaise and fatigue 09/11/2013   Alopecia 03/06/2013   Insomnia 02/06/2013   Gastroesophageal reflux disease without esophagitis 01/19/2013   Dysphagia, unspecified(787.20) 01/19/2013   Dyspnea 11/17/2012   Weakness 11/17/2012   Abnormal MRI of head 09/05/2012   Extrinsic asthma 11/12/2011   Anemia 06/07/2011   Chronic obstructive pulmonary disease ([place]) 01/25/2010   Acquired  hypothyroidism 08/10/2009   MICROSCOPIC HEMATURIA 05/07/2009   GOITER 03/02/2009   Essential hypertension 01/06/2009   MEMORY LOSS 06/23/2008   Obstructive sleep apnea 12/25/2007   DM type 2 (diabetes mellitus, type 2) ([place]) 09/09/2006   ALLERGIC RHINITIS 09/09/2006   DIVERTICULOSIS, COLON 09/09/2006    Palliative Care Assessment & Plan   Recommendations/Plan: Husband shifted to comfort care.  Patient has been receiving medications for symptom management. Comfort orders placed, and will adjust as needed for patient comfort.  Anticipate a hospital death.   Code Status:    Code Status Orders  (From admission, onward)           Start     Ordered   06/04/21 1004  Do not attempt resuscitation (DNR)  Continuous       Question Answer Comment  In the event of cardiac or respiratory ARREST Do not call a code blue   In the event of cardiac or respiratory ARREST Do not perform Intubation, CPR, defibrillation or ACLS   In the event of cardiac or respiratory ARREST Use medication by any route, position, wound care, and other measures to relive pain and suffering. May use oxygen, suction and manual treatment of airway obstruction as needed for comfort.      06/04/21 1003           Code Status History     Date Active Date Inactive Code Status Order ID Comments User Context   05/24/2021 2032 06/04/2021 1003 Full Code [id number]  Criss Alvine, DO ED   04/18/2021 1252 04/20/2021 1607 Full Code [id number]  Crista Luria Inpatient   02/10/2021 1234 02/13/2021 1952 DNR  [id number]  Drue Novel, NP Inpatient   02/08/2021 0125 02/10/2021 1234 Full Code [id number]  Christel Mormon, MD ED   02/01/2021 1635 02/02/2021 2224 Full Code [id number]  Clance Boll, MD ED   01/28/2021 2122 01/31/2021 2158 Full Code [id number]  Cox, [place], DO ED   11/24/2020 2244 11/26/2020 1624 Full Code [id number]  Rhetta Mura, DO ED   11/24/2020 1850 11/24/2020 2244 Full Code [id number]  Rhetta Mura, DO ED    11/03/2020 1015 11/03/2020 1617 Full Code [id number]  Corey Skains, MD Inpatient   09/07/2020 2346 09/08/2020 2002 Full Code [id number]  Athena Masse, MD ED   08/08/2020 1408 08/09/2020 2126 Full Code [id number]  Clance Boll, MD ED   01/08/2019 0216 01/08/2019 2013 Full Code [id number]  Lance Coon, MD Inpatient   12/28/2017 1429 12/29/2017 1918 Full Code [id number]  Demetrios Loll, MD ED   09/19/2017 1551 09/20/2017 2059 Full Code [id number]  Almyra Deforest, [place] Inpatient   08/16/2016 1059 08/24/2016 1905 Full Code [id number]  Nicholes Mango, MD Inpatient   05/27/2016 0239 05/27/2016 1955 Full Code [id number]  Carmon Ginsberg, MD Inpatient   05/25/2016 0023 05/26/2016 1532 Full Code [id number]  Jay Schlichter, MD Inpatient   07/24/2014 0010 07/24/2014 1945 Full Code [id number]  Manus Gunning, MD Inpatient   10/12/2013 2032 10/15/2013 1936 Full Code [id number]  John Giovanni, PA-C Inpatient   10/09/2013 1930 10/12/2013 2032 Full Code [id number]  [name], MD Inpatient   10/08/2013 2312 10/09/2013 1930 Full Code [id number]  Stephani Police, MD ED       Prognosis:  Hours - Days    Care plan was discussed with RN  Thank you for allowing the Palliative Medicine Team to assist in the care of this patient.   Time In: 9:45 10:15 Time Out: 10:15 12:30 Total Time 30 min 135 min Prolonged Time Billed  yes      Greater than 50%  of this time was spent counseling and coordinating care related to the above assessment and plan.  Asencion Gowda, NP  Please contact Palliative Medicine Team phone at [phone] for questions and concerns.

## 2021-07-05 NOTE — Death Summary Note (Signed)
Death Summary  LISVET RASHEED DZH:[id number] DOB: [birth year] DOA: 21-Jun-2021  PCP: Rusty Aus, MD  Admit date: June 21, 2021 Date of Death: 06/30/2021 Time of [id number]    History of present illness:  Patient is a 76 year old female with history of aortic valve repair/bioprosthetic aortic valve replacement with severe AS resulting in TAVR on 11/17, anxiety, depression, COPD, hypertension, OSA, coronary artery disease status post CABG who presented to the emergency department here on 2023/06/22 with worsening malaise, generalized weakness, dyspnea.  She was found to be in acute hypoxic respiratory failure secondary to community-acquired pneumonia/COPD exacerbation.  She was started on IV antibiotics, IV steroids.  Hospital course remarkable for worsening respiratory status, had to transfer to stepdown and ICU and was on BiPAP.  She was also started on Precedex for persistent restlessness, agitation.  Extensive goals of care discussion with family held, palliative care was consulted.  Patient made comfort care on 06-30-2021.  She was started on Dilaudid  drip.  She passed away today at 09/05/30.  Final Diagnoses:  1.  Community-acquired pneumonia/COPD exacerbation   The results of significant diagnostics from this hospitalization (including imaging, microbiology, ancillary and laboratory) are listed below for reference.    Significant Diagnostic Studies: CT HEAD WO CONTRAST (5MM)  Result Date: 06/08/2021 CLINICAL DATA:  Pneumonia, complication suspected. Malaise, weakness EXAM: CT HEAD WITHOUT CONTRAST TECHNIQUE: Contiguous axial images were obtained from the base of the skull through the vertex without intravenous contrast. COMPARISON:  Brain MRI 10/11/2016 FINDINGS: Brain: There is no evidence of acute intracranial hemorrhage, extra-axial fluid collection, or acute infarct. There is an area of encephalomalacia in the left temporoparietal region consistent with remote infarct, new since the prior brain MRI  from 04-Sep-2016. There is an additional small remote lacunar infarct in the right cerebellar hemisphere. Background parenchymal volume is normal. The ventricles are normal in size. Scattered foci of hypodensity in the remainder of the subcortical and periventricular white matter likely reflects sequela of chronic white matter microangiopathy. A small focus of calcification along the falx likely corresponds to the meningioma seen on the prior brain MRI from 04-Sep-2016. There is no other solid mass lesion. There is no midline shift. Vascular: There is calcification of the bilateral cavernous ICAs. Skull: Normal. Negative for fracture or focal lesion. Sinuses/Orbits: The paranasal sinuses are clear. Bilateral lens implants are in place. The globes and orbits are otherwise unremarkable. Other: There is trace fluid in the mastoid air cells bilaterally. IMPRESSION: 1. Encephalomalacia in the left temporoparietal region consistent with remote infarct, new since the prior brain MRI from 2016/09/04. 2. No evidence of acute intracranial pathology. 3. Trace bilateral mastoid effusions. Electronically Signed   By: Valetta Mole M.D.   On: 06/08/2021 14:57   CT CHEST WO CONTRAST  Result Date: 06/08/2021 CLINICAL DATA:  Malaise. Weakness. Dyspnea on exertion. Community-acquired pneumonia and COPD exacerbation. On IV antibiotics. Known aortic dilatation. Aortic valve repair. EXAM: CT CHEST WITHOUT CONTRAST TECHNIQUE: Multidetector CT imaging of the chest was performed following the standard protocol without IV contrast. COMPARISON:  Chest radiograph 1 day prior.  CTA chest 06-21-2021. FINDINGS: Moderate motion degradation. Cardiovascular: Aortic atherosclerosis. Tortuous thoracic aorta. Aortic valve repair. Moderate cardiomegaly, without pericardial effusion. Median sternotomy for CABG. Pulmonary artery enlargement, outflow tract 4.0 cm Mediastinum/Nodes: 1.9 cm right paratracheal node is similar to on the prior. 1.4 cm node within the  azygoesophageal recess is unchanged. Hilar regions poorly evaluated without intravenous contrast. 12 mm prevascular node is unchanged. Lungs/Pleura: Trace left pleural fluid  or thickening is similar. Significantly worsened aeration since 05/10/2021. Relatively diffuse ground-glass and less so airspace disease with areas of septal thickening. Relative sparing of the left apex and superior segment left lower lobe. No areas of extraalveolar air.  No pneumothorax. Upper Abdomen: Caudate and lateral segment left liver lobe enlargement. Normal imaged portions of the spleen, stomach, adrenal glands. Cholecystectomy. Musculoskeletal: No acute osseous abnormality. IMPRESSION: 1. Moderately motion degraded exam. 2. Progression of relatively diffuse airspace and ground-glass opacity. Given lack of significant pleural fluid, favor infection, including atypical etiologies. Recommend clinical exclusion of COVID-19. Pulmonary edema felt less likely. 3. Similar thoracic adenopathy, favored to be reactive. 4. Pulmonary artery enlargement suggests pulmonary arterial hypertension. 5.  Aortic Atherosclerosis (ICD10-I70.0). 6. Suspicion of mild cirrhosis.  Correlate with risk factors. Electronically Signed   By: Abigail Miyamoto M.D.   On: 06/08/2021 14:56   CT Angio Chest Pulmonary Embolism (PE) W or WO Contrast  Result Date: 06/04/2021 CLINICAL DATA:  Pulmonary embolism (PE) suspected, positive D-dimer EXAM: CT ANGIOGRAPHY CHEST WITH CONTRAST TECHNIQUE: Multidetector CT imaging of the chest was performed using the standard protocol during bolus administration of intravenous contrast. Multiplanar CT image reconstructions and MIPs were obtained to evaluate the vascular anatomy. CONTRAST:  50mL OMNIPAQUE IOHEXOL 350 MG/ML SOLN COMPARISON:  02/01/2021 FINDINGS: Cardiovascular: Adequate opacification of the pulmonary arterial tree. No intraluminal filling defect identified to suggest acute pulmonary embolism. Central pulmonary arteries are  enlarged in keeping with changes of pulmonary arterial hypertension, unchanged. Transcatheter aortic valve replacement has been performed. Multi-vessel coronary artery stenting has been performed as well as coronary artery bypass grafting. Cardiomegaly is present, stable. No pericardial effusion. Extensive atheromatous plaque is seen within the thoracic aorta demonstrating mural irregularity and numerous shallow ulcer like projections. No aortic aneurysm. Mediastinum/Nodes: The thyroid gland is absent or atretic. The esophagus is unremarkable. There is interval enlargement of multiple pathologically enlarged lymph nodes within the right paratracheal and prevascular lymph node groups which measure up to 19 mm in short axis diameter. Numerous shotty bilateral hilar lymph nodes are also identified. Lungs/Pleura: Extensive bilateral asymmetric ground-glass pulmonary infiltrate has developed, most in keeping with atypical infection in the acute setting. No pneumothorax or pleural effusion. No central obstructing lesion. Upper Abdomen: No acute abnormality. Musculoskeletal: No acute bone abnormality. No lytic or blastic bone lesion. Review of the MIP images confirms the above findings. IMPRESSION: No pulmonary embolism. Extensive multifocal pulmonary infiltrate most in keeping with atypical infection in the acute setting. Stable cardiomegaly. Aortic Atherosclerosis (ICD10-I70.0). Electronically Signed   By: Fidela Salisbury M.D.   On: 06/04/2021 00:04   US Venous Img Lower Unilateral Left  Result Date: 05/16/2021 CLINICAL DATA:  Initial evaluation for acute swelling of left calf, evaluate for DVT. EXAM: Left LOWER EXTREMITY VENOUS DOPPLER ULTRASOUND TECHNIQUE: Gray-scale sonography with graded compression, as well as color Doppler and duplex ultrasound were performed to evaluate the lower extremity deep venous systems from the level of the common femoral vein and including the common femoral, femoral, profunda femoral,  popliteal and calf veins including the posterior tibial, peroneal and gastrocnemius veins when visible. The superficial great saphenous vein was also interrogated. Spectral Doppler was utilized to evaluate flow at rest and with distal augmentation maneuvers in the common femoral, femoral and popliteal veins. COMPARISON:  None. FINDINGS: Contralateral Common Femoral Vein: Respiratory phasicity is normal and symmetric with the symptomatic side. No evidence of thrombus. Normal compressibility. Common Femoral Vein: No evidence of thrombus. Normal compressibility, respiratory phasicity and response  to augmentation. Saphenofemoral Junction: No evidence of thrombus. Normal compressibility and flow on color Doppler imaging. Profunda Femoral Vein: No evidence of thrombus. Normal compressibility and flow on color Doppler imaging. Femoral Vein: No evidence of thrombus. Normal compressibility, respiratory phasicity and response to augmentation. Popliteal Vein: No evidence of thrombus. Normal compressibility, respiratory phasicity and response to augmentation. Calf Veins: No evidence of thrombus. Normal compressibility and flow on color Doppler imaging. Superficial Great Saphenous Vein: No evidence of thrombus. Normal compressibility. Venous Reflux:  None. Other Findings:  None. IMPRESSION: No evidence of deep venous thrombosis. Electronically Signed   By: Jeannine Boga M.D.   On: 05/21/2021 19:41   DG Chest Port 1 View  Result Date: 06/07/2021 CLINICAL DATA:  Reason for exam: SOB Hx of CHF, COPD, asthma, aortic valve repair, history of bioprosthetic aortic valve replacement failure with severe AS and resulting in TAVR on 04/20/2021, Rapid Response Nurse currently in room. EXAM: PORTABLE CHEST - 1 VIEW COMPARISON:  06/02/2021 FINDINGS: Interval progression of extensive bilateral asymmetric airspace opacities most confluent in the right mid and upper lung, and left lower lung, with enlarging focal opacity centrally in  the left upper lung and worsening interstitial and airspace opacities at the right lung base. Heart size upper limits normal for technique. Previous AVR. CABG markers. Blunting of lateral costophrenic angles suggesting effusions. No pneumothorax. Sternotomy wires. IMPRESSION: 1. Worsening asymmetric extensive airspace infiltrates or edema. 2. Suspect small pleural effusions. Electronically Signed   By: Lucrezia Europe M.D.   On: 06/07/2021 07:51   DG Chest Port 1 View  Result Date: 05/17/2021 CLINICAL DATA:  Pt to ED via [facility] from home for shortness of breath. Pt speaking in complete sentences stating that she cannot breath. EXAM: PORTABLE CHEST 1 VIEW COMPARISON:  05/21/2021 FINDINGS: Stable changes from prior cardiac surgery and aortic valve replacement. Cardiac silhouette is enlarged. No mediastinal or hilar masses. Bilateral interstitial and patchy hazy airspace lung opacities. Interstitial opacities are similar to the prior exam, airspace opacities appear new. No convincing pleural effusion.  No pneumothorax. Skeletal structures are grossly intact. IMPRESSION: 1. Interstitial and hazy airspace lung opacities. Congestive heart failure with pulmonary edema suspected. Multifocal pneumonia is also in the differential diagnosis. Electronically Signed   By: Lajean Manes M.D.   On: 05/04/2021 18:42   DG Chest Portable 1 View  Result Date: 05/21/2021 CLINICAL DATA:  Shortness of breath and heart racing. EXAM: PORTABLE CHEST 1 VIEW COMPARISON:  April 30, 2021 FINDINGS: Multiple sternal wires are noted. Stable, diffuse, chronic appearing increased interstitial lung markings are seen. There is no evidence of focal consolidation, pleural effusion or pneumothorax. The cardiac silhouette is enlarged and unchanged in size. An artificial aortic valve is seen. The visualized skeletal structures are unremarkable. IMPRESSION: Stable cardiomegaly and evidence of prior median sternotomy/CABG, without acute cardiopulmonary  disease. Electronically Signed   By: Virgina Norfolk M.D.   On: 05/21/2021 02:40   DG Knee Complete 4 Views Left  Result Date: 05/27/2021 CLINICAL DATA:  Pain, swelling, bruising.  Fall. EXAM: LEFT KNEE - COMPLETE 4+ VIEW COMPARISON:  None. FINDINGS: Anterior soft tissue swelling. No acute bony abnormality. Specifically, no fracture, subluxation, or dislocation. No joint effusion. IMPRESSION: Anterior soft tissue swelling.  No acute bony abnormality. Electronically Signed   By: Rolm Baptise M.D.   On: 05/27/2021 05:28   ECHOCARDIOGRAM COMPLETE  Result Date: 06/04/2021    ECHOCARDIOGRAM REPORT   Patient Name:   PHYLICIA MCGAUGH Date of Exam: 06/04/2021 Medical  Rec #:  [id number]     Height:       66.0 in Accession #:    [id number]    Weight:       208.8 lb Date of Birth:  03-Jan-1946      BSA:          2.037 m Patient Age:    37 years      BP:           151/77 mmHg Patient Gender: F             HR:           86 bpm. Exam Location:  [facility] Procedure: 2D Echo and Intracardiac Opacification Agent Indications:     Dyspnea R06.00  History:         Patient has prior history of Echocardiogram examinations, most                  recent 05/17/2021.                  Aortic Valve: bioprosthetic valve is present in the aortic                  position.  Sonographer:     Kathlen Brunswick RDCS Referring Phys:  [id number] AMY N COX Diagnosing Phys: Kate Sable MD  Sonographer Comments: Technically challenging study due to limited acoustic windows, Technically difficult study due to poor echo windows and no apical window. Image acquisition challenging due to patient body habitus. IMPRESSIONS  1. Left ventricular ejection fraction, by estimation, is 55 to 60%. The left ventricle has normal function. Left ventricular endocardial border not optimally defined to evaluate regional wall motion. There is moderate left ventricular hypertrophy. Left ventricular diastolic parameters are consistent with Grade II diastolic dysfunction  (pseudonormalization).  2. Right ventricular systolic function is normal. The right ventricular size is not well visualized.  3. Left atrial size was mildly dilated.  4. A small pericardial effusion is present. The pericardial effusion is posterior and lateral to the left ventricle. There is no evidence of cardiac tamponade.  5. The mitral valve is degenerative. Trivial mitral valve regurgitation. Mild to moderate mitral stenosis. The mean mitral valve gradient is 8.0 mmHg.  6. The aortic valve has been repaired/replaced. Aortic valve regurgitation is not visualized. There is a bioprosthetic valve present in the aortic position.  7. Aortic dilatation noted. There is mild dilatation of the ascending aorta, measuring 40 mm. FINDINGS  Left Ventricle: Left ventricular ejection fraction, by estimation, is 55 to 60%. The left ventricle has normal function. Left ventricular endocardial border not optimally defined to evaluate regional wall motion. Definity contrast agent was given IV to delineate the left ventricular endocardial borders. The left ventricular internal cavity size was normal in size. There is moderate left ventricular hypertrophy. Left ventricular diastolic parameters are consistent with Grade II diastolic dysfunction (pseudonormalization). Right Ventricle: The right ventricular size is not well visualized. No increase in right ventricular wall thickness. Right ventricular systolic function is normal. Left Atrium: Left atrial size was mildly dilated. Right Atrium: Right atrial size was not well visualized. Pericardium: A small pericardial effusion is present. The pericardial effusion is posterior and lateral to the left ventricle. There is no evidence of cardiac tamponade. Mitral Valve: The mitral valve is degenerative in appearance. Mild to moderate mitral annular calcification. Trivial mitral valve regurgitation. Mild to moderate mitral valve stenosis. MV peak gradient, 14.3 mmHg. The mean mitral valve  gradient is  8.0 mmHg. Tricuspid Valve: The tricuspid valve is normal in structure. Tricuspid valve regurgitation is mild. Aortic Valve: The aortic valve has been repaired/replaced. Aortic valve regurgitation is not visualized. Aortic valve mean gradient measures 2.0 mmHg. Aortic valve peak gradient measures 4.1 mmHg. Aortic valve area, by VTI measures 4.24 cm. There is a bioprosthetic valve present in the aortic position. Pulmonic Valve: The pulmonic valve was normal in structure. Pulmonic valve regurgitation is mild. Aorta: Aortic dilatation noted. There is mild dilatation of the ascending aorta, measuring 40 mm. Venous: The inferior vena cava was not well visualized. IAS/Shunts: No atrial level shunt detected by color flow Doppler.  LEFT VENTRICLE PLAX 2D LVIDd:         4.55 cm      Diastology LVIDs:         3.15 cm      LV e' medial:    5.11 cm/s LV PW:         1.65 cm      LV E/e' medial:  31.3 LV IVS:        1.75 cm      LV e' lateral:   7.51 cm/s LVOT diam:     1.70 cm      LV E/e' lateral: 21.3 LV SV:         54 LV SV Index:   27 LVOT Area:     2.27 cm  LV Volumes (MOD) LV vol d, MOD A2C: 84.3 ml LV vol d, MOD A4C: 123.0 ml LV vol s, MOD A2C: 24.4 ml LV vol s, MOD A4C: 31.7 ml LV SV MOD A2C:     59.9 ml LV SV MOD A4C:     123.0 ml LV SV MOD BP:      79.1 ml LEFT ATRIUM           Index LA diam:      4.70 cm 2.31 cm/m LA Vol (A4C): 74.9 ml 36.77 ml/m  AORTIC VALVE                    PULMONIC VALVE AV Area (Vmax):    2.87 cm     PV Vmax:          1.32 m/s AV Area (Vmean):   2.86 cm     PV Peak grad:     7.0 mmHg AV Area (VTI):     4.24 cm     PR End Diast Vel: 6.66 msec AV Vmax:           101.35 cm/s AV Vmean:          64.200 cm/s AV VTI:            0.128 m AV Peak Grad:      4.1 mmHg AV Mean Grad:      2.0 mmHg LVOT Vmax:         128.00 cm/s LVOT Vmean:        80.900 cm/s LVOT VTI:          0.239 m LVOT/AV VTI ratio: 1.87  AORTA Ao Root diam: 2.90 cm Ao Asc diam:  4.00 cm MITRAL VALVE                 TRICUSPID VALVE MV Area (PHT): 3.00 cm     TV Peak grad:   27.6 mmHg MV Area VTI:   1.02 cm     TV Vmax:        2.62 m/s MV Peak  grad:  14.3 mmHg MV Mean grad:  8.0 mmHg     SHUNTS MV Vmax:       1.89 m/s     Systemic VTI:  0.24 m MV Vmean:      136.0 cm/s   Systemic Diam: 1.70 cm MV Decel Time: 253 msec MV E velocity: 160.00 cm/s MV A velocity: 173.00 cm/s MV E/A ratio:  0.92 Kate Sable MD Electronically signed by Kate Sable MD Signature Date/Time: 06/04/2021/3:30:30 PM    Final    ECHOCARDIOGRAM COMPLETE  Result Date: 05/17/2021    ECHOCARDIOGRAM REPORT   Patient Name:   LEXII WALSH Date of Exam: 05/17/2021 Medical Rec #:  [id number]     Height:       65.0 in Accession #:    [id number]    Weight:       215.0 lb Date of Birth:  19-Sep-1945      BSA:          2.040 m Patient Age:    59 years      BP:           130/58 mmHg Patient Gender: F             HR:           85 bpm. Exam Location:  [address] Procedure: 2D Echo, Cardiac Doppler and Color Doppler Indications:    Z95.2 Status post TAVR  History:        Patient has prior history of Echocardiogram examinations, most                 recent 04/19/2021. CAD, Status post TAVR-23 mm Godinho S3U,                 COPD; Risk Factors:Sleep Apnea, Dyslipidemia, Diabetes and                 Hypertension.  Sonographer:    Cresenciano Lick RDCS Referring Phys: [phone] JILL D MCDANIEL IMPRESSIONS  1. Left ventricular ejection fraction, by estimation, is 55 to 60%. The left ventricle has normal function. The left ventricle has no regional wall motion abnormalities. There is moderate left ventricular hypertrophy. Left ventricular diastolic parameters are consistent with Grade I diastolic dysfunction (impaired relaxation).  2. Right ventricular systolic function is normal. The right ventricular size is normal. There is normal pulmonary artery systolic pressure. The estimated right ventricular systolic pressure is 83.4 mmHg.  3. Left atrial size was mildly  dilated.  4. Right atrial size was mildly dilated.  5. The mitral valve is degenerative. No evidence of mitral valve regurgitation. Moderate to severe mitral stenosis. The mean mitral valve gradient is 9.0 mmHg with MVA by VTI 1.12 cm^2. Moderate mitral annular calcification with extensive valvular calcification.  6. Bioprosthetic aortic valve s/p TAVR. 23 mm Musson Sapien THV. No perivalvular leakage noted. Mean gradient 33 mmHg is significantly elevated, dimensionless index 0.31. Would suggest TEE or gated CTA to more closely evaluate bioprosthetic valve stenosis.  7. Aortic dilatation noted. There is mild dilatation of the ascending aorta, measuring 40 mm.  8. The inferior vena cava is normal in size with greater than 50% respiratory variability, suggesting right atrial pressure of 3 mmHg. FINDINGS  Left Ventricle: Left ventricular ejection fraction, by estimation, is 55 to 60%. The left ventricle has normal function. The left ventricle has no regional wall motion abnormalities. The left ventricular internal cavity size was normal in size. There is  moderate left ventricular hypertrophy. Left  ventricular diastolic parameters are consistent with Grade I diastolic dysfunction (impaired relaxation). Right Ventricle: The right ventricular size is normal. No increase in right ventricular wall thickness. Right ventricular systolic function is normal. There is normal pulmonary artery systolic pressure. The tricuspid regurgitant velocity is 2.55 m/s, and  with an assumed right atrial pressure of 3 mmHg, the estimated right ventricular systolic pressure is 62.8 mmHg. Left Atrium: Left atrial size was mildly dilated. Right Atrium: Right atrial size was mildly dilated. Pericardium: Trivial pericardial effusion is present. Mitral Valve: The mitral valve is degenerative in appearance. There is severe calcification of the mitral valve leaflet(s). Moderate mitral annular calcification. No evidence of mitral valve  regurgitation. Moderate to severe mitral valve stenosis. MV peak gradient, 14.3 mmHg. The mean mitral valve gradient is 9.0 mmHg. Tricuspid Valve: The tricuspid valve is normal in structure. Tricuspid valve regurgitation is trivial. Aortic Valve: Bioprosthetic aortic valve s/p TAVR. 23 mm Selvage Sapien THV. No perivalvular leakage noted. Mean gradient 33 mmHg is significantly elevated, dimensionless index 0.31. Would suggest TEE or gated CTA to more closely evaluate bioprosthetic valve stenosis. The aortic valve has been repaired/replaced. Aortic valve regurgitation is not visualized. Aortic valve mean gradient measures 33.0 mmHg. Aortic valve peak gradient measures 34.5 mmHg. Aortic valve area, by VTI measures 1.02 cm. Pulmonic Valve: The pulmonic valve was normal in structure. Pulmonic valve regurgitation is not visualized. Aorta: Aortic dilatation noted. There is mild dilatation of the ascending aorta, measuring 40 mm. Venous: The inferior vena cava is normal in size with greater than 50% respiratory variability, suggesting right atrial pressure of 3 mmHg. IAS/Shunts: No atrial level shunt detected by color flow Doppler.  LEFT VENTRICLE PLAX 2D LVIDd:         4.60 cm   Diastology LVIDs:         3.00 cm   LV e' medial:    6.09 cm/s LV PW:         1.20 cm   LV E/e' medial:  25.9 LV IVS:        1.20 cm   LV e' lateral:   7.62 cm/s LVOT diam:     1.90 cm   LV E/e' lateral: 20.7 LV SV:         57 LV SV Index:   28 LVOT Area:     2.84 cm  RIGHT VENTRICLE RV Basal diam:  4.00 cm RV S prime:     12.70 cm/s TAPSE (M-mode): 2.2 cm LEFT ATRIUM             Index        RIGHT ATRIUM           Index LA diam:        5.10 cm 2.50 cm/m   RA Area:     19.50 cm LA Vol (A2C):   86.4 ml 42.36 ml/m  RA Volume:   62.50 ml  30.64 ml/m LA Vol (A4C):   54.3 ml 26.62 ml/m LA Biplane Vol: 72.6 ml 35.59 ml/m  AORTIC VALVE AV Area (Vmax):    1.02 cm AV Area (Vmean):   0.95 cm AV Area (VTI):     1.02 cm AV Vmax:           293.80  cm/s AV Vmean:          218.200 cm/s AV VTI:            0.553 m AV Peak Grad:      34.5 mmHg AV  Mean Grad:      33.0 mmHg LVOT Vmax:         105.50 cm/s LVOT Vmean:        73.000 cm/s LVOT VTI:          0.200 m LVOT/AV VTI ratio: 0.36  AORTA Ao Root diam: 3.50 cm Ao Asc diam:  4.00 cm MITRAL VALVE                TRICUSPID VALVE MV Area (PHT): 3.34 cm     TR Peak grad:   26.0 mmHg MV Area VTI:   1.12 cm     TR Vmax:        255.00 cm/s MV Peak grad:  14.3 mmHg MV Mean grad:  9.0 mmHg     SHUNTS MV Vmax:       1.89 m/s     Systemic VTI:  0.20 m MV Vmean:      145.5 cm/s   Systemic Diam: 1.90 cm MV VTI:        0.50 m MV Decel Time: 227 msec MV E velocity: 157.50 cm/s MV A velocity: 177.00 cm/s MV E/A ratio:  0.89 Dalton McleanMD Electronically signed by Franki Monte Signature Date/Time: 05/17/2021/6:15:59 PM    Final     Microbiology: Recent Results (from the past 240 hour(s))  Resp Panel by RT-PCR (Flu A&B, Covid) Nasopharyngeal Swab     Status: None   Collection Time: 06/04/21  8:32 AM   Specimen: Nasopharyngeal Swab; Nasopharyngeal(NP) swabs in vial transport medium  Result Value Ref Range Status   SARS Coronavirus 2 by RT PCR NEGATIVE NEGATIVE Final    Comment: (NOTE) SARS-CoV-2 target nucleic acids are NOT DETECTED.  The SARS-CoV-2 RNA is generally detectable in upper respiratory specimens during the acute phase of infection. The lowest concentration of SARS-CoV-2 viral copies this assay can detect is 138 copies/mL. A negative result does not preclude SARS-Cov-2 infection and should not be used as the sole basis for treatment or other patient management decisions. A negative result may occur with  improper specimen collection/handling, submission of specimen other than nasopharyngeal swab, presence of viral mutation(s) within the areas targeted by this assay, and inadequate number of viral copies(<138 copies/mL). A negative result must be combined with clinical observations, patient  history, and epidemiological information. The expected result is Negative.  Fact Sheet for Patients:  EntrepreneurPulse.com.au  Fact Sheet for Healthcare Providers:  IncredibleEmployment.be  This test is no t yet approved or cleared by the [place] FDA and  has been authorized for detection and/or diagnosis of SARS-CoV-2 by FDA under an Emergency Use Authorization (EUA). This EUA will remain  in effect (meaning this test can be used) for the duration of the COVID-19 declaration under Section 564(b)(1) of the Act, 21 U.S.C.section 360bbb-3(b)(1), unless the authorization is terminated  or revoked sooner.       Influenza A by PCR NEGATIVE NEGATIVE Final   Influenza B by PCR NEGATIVE NEGATIVE Final    Comment: (NOTE) The Xpert Xpress SARS-CoV-2/FLU/RSV plus assay is intended as an aid in the diagnosis of influenza from Nasopharyngeal swab specimens and should not be used as a sole basis for treatment. Nasal washings and aspirates are unacceptable for Xpert Xpress SARS-CoV-2/FLU/RSV testing.  Fact Sheet for Patients: EntrepreneurPulse.com.au  Fact Sheet for Healthcare Providers: IncredibleEmployment.be  This test is not yet approved or cleared by the [place] FDA and has been authorized for detection and/or diagnosis of SARS-CoV-2 by FDA under an Emergency  Use Authorization (EUA). This EUA will remain in effect (meaning this test can be used) for the duration of the COVID-19 declaration under Section 564(b)(1) of the Act, 21 U.S.C. section 360bbb-3(b)(1), unless the authorization is terminated or revoked.  Performed at [facility], [address] [place], [place] [address]   MRSA Next Gen by PCR, Nasal     Status: None   Collection Time: 06/07/21  8:00 AM   Specimen: Nasal Mucosa; Nasal Swab  Result Value Ref Range Status   MRSA by PCR Next Gen NOT DETECTED NOT DETECTED Final     Comment: (NOTE) The GeneXpert MRSA Assay (FDA approved for NASAL specimens only), is one component of a comprehensive MRSA colonization surveillance program. It is not intended to diagnose MRSA infection nor to guide or monitor treatment for MRSA infections. Test performance is not FDA approved in patients less than 77 years old. Performed at [facility], [place], [place] [address]   Respiratory (~20 pathogens) panel by PCR     Status: None   Collection Time: 06/07/21  1:51 PM   Specimen: Nasopharyngeal Swab; Respiratory  Result Value Ref Range Status   Adenovirus NOT DETECTED NOT DETECTED Final   Coronavirus 229E NOT DETECTED NOT DETECTED Final    Comment: (NOTE) The Coronavirus on the Respiratory Panel, DOES NOT test for the novel  Coronavirus (2019 nCoV)    Coronavirus HKU1 NOT DETECTED NOT DETECTED Final   Coronavirus NL63 NOT DETECTED NOT DETECTED Final   Coronavirus OC43 NOT DETECTED NOT DETECTED Final   Metapneumovirus NOT DETECTED NOT DETECTED Final   Rhinovirus / Enterovirus NOT DETECTED NOT DETECTED Final   Influenza A NOT DETECTED NOT DETECTED Final   Influenza B NOT DETECTED NOT DETECTED Final   Parainfluenza Virus 1 NOT DETECTED NOT DETECTED Final   Parainfluenza Virus 2 NOT DETECTED NOT DETECTED Final   Parainfluenza Virus 3 NOT DETECTED NOT DETECTED Final   Parainfluenza Virus 4 NOT DETECTED NOT DETECTED Final   Respiratory Syncytial Virus NOT DETECTED NOT DETECTED Final   Bordetella pertussis NOT DETECTED NOT DETECTED Final   Bordetella Parapertussis NOT DETECTED NOT DETECTED Final   Chlamydophila pneumoniae NOT DETECTED NOT DETECTED Final   Mycoplasma pneumoniae NOT DETECTED NOT DETECTED Final    Comment: Performed at [facility], [place]. [address], [place],  [address]     Labs: Basic Metabolic Panel: Recent Labs  Lab 06/08/21 [phone] 06/09/21 0619 06/10/21 0514 06/11/21 0349 Jun 22, 2021 0600  NA 138 141 138 139 144  K  4.5 4.4 4.2 4.5 4.8  CL 99 104 103 106 109  CO2 27 26 27 25 26   GLUCOSE 98 66* 124* 92 95  BUN 40* 57* 51* 51* 62*  CREATININE 1.72* 1.42* 1.31* 1.24* 1.60*  CALCIUM 8.1* 8.6* 8.5* 8.6* 8.8*  MG 2.3  --  2.2 2.1 2.4  PHOS 6.1* 7.5* 4.9* 5.3* 6.1*   Liver Function Tests: Recent Labs  Lab 06/08/21 0607 06/11/21 0349  AST 21 17  ALT 16 15  ALKPHOS 129* 109  BILITOT 0.9 0.7  PROT 6.2* 5.9*  ALBUMIN 2.6* 2.5*   No results for input(s): LIPASE, AMYLASE in the last 168 hours. No results for input(s): AMMONIA in the last 168 hours. CBC: Recent Labs  Lab 06/08/21 0607 06/09/21 0619 06/10/21 0514 06/11/21 0349 06-22-21 0600  WBC 13.1* 11.2* 15.7* 10.8* 22.6*  NEUTROABS 11.2*  --   --  8.9*  --   HGB 7.2* 7.3*  8.3* 7.4* 8.0*  HCT 23.5* 24.0* 27.2* 24.5* 27.4*  MCV 82.5 83.3 82.7 83.1 83.5  PLT 195 180 223 180 221   Cardiac Enzymes: No results for input(s): CKTOTAL, CKMB, CKMBINDEX, TROPONINI in the last 168 hours. D-Dimer No results for input(s): DDIMER in the last 72 hours. BNP: Invalid input(s): POCBNP CBG: Recent Labs  Lab 06/11/21 0728 06/11/21 1217 06/11/21 1554 06/11/21 2135 2021/06/25 0746  GLUCAP 119* 81 102* 149* 79   Anemia work up No results for input(s): VITAMINB12, FOLATE, FERRITIN, TIBC, IRON, RETICCTPCT in the last 72 hours. Urinalysis    Component Value Date/Time   COLORURINE STRAW (A) 04/14/2021 1516   APPEARANCEUR HAZY (A) 04/14/2021 1516   APPEARANCEUR Clear 11/25/2012 1335   LABSPEC 1.006 04/14/2021 1516   LABSPEC 1.017 11/25/2012 1335   PHURINE 5.0 04/14/2021 1516   GLUCOSEU NEGATIVE 04/14/2021 1516   GLUCOSEU Negative 11/25/2012 1335   HGBUR NEGATIVE 04/14/2021 1516   HGBUR negative 11/11/2009 0854   BILIRUBINUR NEGATIVE 04/14/2021 1516   BILIRUBINUR Negative 11/25/2012 [place] 04/14/2021 1516   PROTEINUR NEGATIVE 04/14/2021 1516   UROBILINOGEN 0.2 11/11/2009 0854   NITRITE NEGATIVE 04/14/2021 1516   LEUKOCYTESUR  NEGATIVE 04/14/2021 1516   LEUKOCYTESUR Negative 11/25/2012 1335   Sepsis Labs Invalid input(s): PROCALCITONIN,  WBC,  LACTICIDVEN     SIGNED:  Shelly Coss, MD  [facility] 06/25/21, 2:15 PM Pager [phone]  If 7PM-7AM, please contact night-coverage [email or web address] Password [email or web address]

## 2021-07-05 NOTE — Progress Notes (Signed)
Pt made comfort care and morphine gtt started once, HFNC was changed to 2L [place] patient became very aqitated and struggling, Palliative, Soil scientist, charge nurse and myself all assisted in getting patient as comfortable as possible quickly, see MAR for all meds and boluses. Pt pronounced dead at 1232 by myself and myra rn

## 2021-07-05 NOTE — Progress Notes (Signed)
OT Cancellation Note  Patient Details Name: [name] MRN: [id number] DOB: March 26, 1946   Cancelled Treatment:    Reason Eval/Treat Not Completed: Other (comment). Per MD, ok to sign off at this time as pt is not medically appropriate for OT. Please re-consult if status changes. Thank you.   Dessie Coma, M.S. OTR/L  09-Jul-2021, 7:59 AM  ascom [phone]

## 2021-07-05 NOTE — Progress Notes (Signed)
°   Jun 14, 2021 1300  Clinical Encounter Type  Visited With Patient and family together  Visit Type Initial  Referral From Nurse  Consult/Referral To Chaplain   Patient passed away previous to my arrival. Family was in the midst of discussion and declined any help at this time from the [place]. I left them indicating the chaplain team was available if they needed our services.

## 2021-07-05 NOTE — Progress Notes (Signed)
CRITICAL CARE         Date: 2021-06-23,   MRN# [id number] [name] 12/07/1945     AdmissionWeight: 98.1 kg                 CurrentWeight: 95.4 kg   Referring physician: Dr Jimmye Norman   CHIEF COMPLAINT:   Severe acute on chronic hypoxemic respiratory failure   HISTORY OF PRESENT ILLNESS   76 yo with hx of CABG 2015 , depression,anxiety, hypertension, OSA, COPD, iron deficiency anemia, aortic dilatation, presents emergency department for cc of dyspnea and sob. She is chronically hypoxemic with 3L/min [place] requirement. Came in for acute onset of sob/doe was negative for PE on admission. She is on BIPAP in resp distress.She denies recent fever, nausea, vomiting, abdominal pain, dysuria, diarrhea, hematuria, blood in her stool, syncope, lost of consciousness. She reports chest pain and severe SOB.She has severe anxiety and recurrent panic attacks She reports no new cough. She is unsure if she has had weight gain or lost. She denies flu like illness. She denies LE edema. PCCM consult placed for further evaluation and management.   06/08/21- patient is improved, she did have few episodes of mild bradycardia 40s. I Met with son and husband today to review medical plan and prognosis. They have met with palliative care today.   06/09/21- patient had severe and recurrent anxiety.  She had multiple episodes of severe respiratory distress with O2 100% and only responding to anxiolytics and sedatives.   06/10/20-  I met with patient several times today she continues to ask for drugs to "put me out" due to severe dyspnea with COPD severe anxiety disorder. I have met with family today numerous times and examined patient with them.  We reviewed hosptial course and medical plan at length  06/11/21- Husband is agreeable to initiate comfort care on 06/23/21.   1/9 severe resp distress, screaming can't breathe    INTERVAL CHANGES SEVERE RESP DISTRESS SHOUTING CAN'T BREATHE SUFFERING AND INCREASED  WOB  PAST MEDICAL HISTORY   Past Medical History:  Diagnosis Date   1st degree AV block    ACE-inhibitor cough    Allergic rhinitis    Anemia    iron deficiency anemia   Anxiety    Aortic ectasia (HCC)    a. CT abd in 12/2016 incidentally noted aortic atherosclerosis and infrarenal abdominal aortic ectasia measuring as large as 2.7 cm with recommendation to repeat US in 2023.   Aortic stenosis    a. 10/2013 s/p AVR; b. 04/2021 s/p TAVR.   Arthritis    Asthma    Cataract    Chronic depression    Chronic diastolic CHF (congestive heart failure) (HCC)    Chronic headache    CKD (chronic kidney disease), stage III (HCC)    COPD (chronic obstructive pulmonary disease) (HCC)    Coronary artery disease    a. DES to RCA and mid Cx 2009. b. CABG x 2 and bioprosthetic AVR May 2015. c. cutting balloon to prox Cx in 05/2016; d. 11/2020 PCI/DES to LCX and VG->RCA (prox and distal); e. 02/2021 Cath: LM nl, LAD 172mCTO, D1 min irregs, LCX 50p/m ISR, patent mid stent, RCA 20ost, 80p, 1034mRPDA nl, VG->dRCA patent p/d stents, LIMA->LAD patent.   D[place]   Diverticulitis of colon    Essential hypertension    GERD (gastroesophageal reflux disease)    Hearing loss    History of blood transfusion 2013   History  of prosthetic aortic valve replacement    a. 10/2013 s/p bioprosthetic AVR; b. 04/2021 s/p TAVR w/ Duchemin Sapien 3 THV (92m); c. 05/2021 Echo: EF 55-60%, no PV leak, 339mg mean gradient; d. 06/2021 Echo: EF 55-60%, mod LVH. GrII DD. Nl RV size/fxn. Mildly dil LA. Small peric eff w/o tamponade. Triv MR. Mild-mod MS (mean grad 71m75m), Nl fxn'ing AoV - peak grad 4.1mm49m mean grad 2.0mmH90mAVA 4.24cm^2 (VTI).   HOH (hard of hearing)    Hypercholesterolemia    a. intolerance of statins and niaspan -   IDA (iron deficiency anemia) 02/03/2019   Mitral stenosis    a. 06/2021 Echo: Mild-mod MS (mean gradient 71mmHg14m Mobitz type 1 second degree AV block    OSA (obstructive sleep apnea)     mild, intolerant of cpap   PAD (peripheral artery disease) (HCC)  [place]. atherosclerosis by CT abd 12/2016 in LE.   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 McAlhnSumner Boast BartleCyndia Bent2/2022 Echo: EF 55-60%, no PV leak, 33mmHg15mn gradient; c. 06/2021 Echo: EF 55-60%, mod LVH. GrII DD. Nl fxn'ing AoV - peak grad 4.1mmHg, 22mn grad 2.0mmHg. A29m4.24cm^2 (VTI).   Statin intolerance    Thyroid disease    Type II diabetes mellitus (HCC)     KingfisherICATIONS    Current Facility-Administered Medications:    0.9 %  sodium chloride infusion, 250 mL, Intravenous, Continuous, AleskerovLanney GinsD, Stopped at 06/09/21 0938   albuterol (PROVENTIL) (2.5 MG/3ML) 0.083% nebulizer solution 2.5 mg, 2.5 mg, Nebulization, Q6H PRN, Cox, Amy N, DO, 2.5 mg at August 27, 2021 02/02/23zelastine (ASTELIN) 0.1 % nasal spray 1 spray, 1 spray, Each Nare, BID, Cox, Amy N, DO, 1 spray at 06/11/21 2143   Chlorhexidine Gluconate Cloth 2 % PADS 6 each, 6 each, Topical, Daily, Hall, CarKayleen Memosach at 06/11/21 1056   clopidogrel (PLAVIX) tablet 75 mg, 75 mg, Oral, Daily, Cox, Amy N, DO, 75 mg at 06/10/21 1228   dexmedetomidine (PRECEDEX) 400 MCG/100ML (4 mcg/mL) infusion, 0.4-1.5 mcg/kg/hr, Intravenous, Titrated, Rust-Chester, Britton L, NP, Last Rate: 36.6 mL/hr at August 27, 2021 February 02, 20235 mcg/kg/hr at August 27, 2021 2023/02/02iazepam (VALIUM) injection 5 mg, 5 mg, Intravenous, Q6H PRN, Jaxsin Bottomley, KurFlora LippsLoxetine (CYMBALTA) DR capsule 20 mg, 20 mg, Oral, Q breakfast, Cox, Amy N, DO, 20 mg at 06/10/21 0746   enoxaparin (LOVENOX) injection 50 mg, 0.5 mg/kg, Subcutaneous, Q24H, Hall, Carole N, DO, 50 mg at 06/11/21 2143   feeding supplement (NEPRO CARB STEADY) liquid 237 mL, 237 mL, Oral, BID BM, Hall, Carole N, DO, 237 mL at 06/09/21 1413   FLUoxetine (PROZAC) capsule 20 mg, 20 mg, Oral, BID, Lord, Jamison  Y, NP, 20 mg at 06/10/21 2159   food thickener (SIMPLYTHICK (HONEY/LEVEL 3/MODERATELY THICK)) 1 packet, 1 packet, Oral, PRN, Hall, CarIrene Papfurosemide (LASIX) injection 40 mg, 40 mg, Intravenous, Daily, AleskerovLanney GinsD, 40 mg at 06/11/21 1054   insulin aspart (novoLOG) injection 0-15 Units, 0-15 Units, Subcutaneous, TID WC, Cox, Amy N, DO, 3 Units at 06/09/21 1639   insulin aspart (novoLOG) injection 0-5 Units, 0-5 Units, Subcutaneous, QHS, Cox, Amy N, DO, 5 Units at 06/06/21 2307   insulin glargine-yfgn (SEMGLEE) injection 50 Units, 50 Units, Subcutaneous, QHS, Hall, Carole N, DO, 50 Units  at 06/11/21 2142   levothyroxine (SYNTHROID) tablet 125 mcg, 125 mcg, Oral, Q0600, Cox, Amy N, DO, 125 mcg at 06/10/21 0739   LORazepam (ATIVAN) injection 0.5 mg, 0.5 mg, Intravenous, Q6H PRN, Shelly Coss, MD, 0.5 mg at June 15, 2021 0450   MEDLINE mouth rinse, 15 mL, Mouth Rinse, q12n4p, Aleskerov, Fuad, MD, 15 mL at 06/11/21 1659   melatonin tablet 5 mg, 5 mg, Oral, QHS PRN, [place] Crane, Carole N, DO   methylPREDNISolone sodium succinate (SOLU-MEDROL) 40 mg/mL injection 20 mg, 20 mg, Intravenous, Daily, Lanney Gins, Fuad, MD, 20 mg at 06/11/21 1055   morphine 2 MG/ML injection 2 mg, 2 mg, Intravenous, Q2H PRN, Ottie Glazier, MD, 2 mg at 2021-06-15 0609   multivitamin with minerals tablet 1 tablet, 1 tablet, Oral, Daily, Hall, Carole N, DO   nitroGLYCERIN (NITROSTAT) SL tablet 0.4 mg, 0.4 mg, Sublingual, Q5 min PRN, Cox, Amy N, DO   ondansetron (ZOFRAN) tablet 4 mg, 4 mg, Oral, Q6H PRN **OR** ondansetron (ZOFRAN) injection 4 mg, 4 mg, Intravenous, Q6H PRN, Cox, Amy N, DO, 4 mg at 06/06/21 2007   pantoprazole (PROTONIX) injection 40 mg, 40 mg, Intravenous, Daily, Benita Gutter, RPH, 40 mg at 06/11/21 1055    ALLERGIES   Amitriptyline, Benadryl [diphenhydramine], Demerol [meperidine], Gabapentin, Mirtazapine, Olanzapine, Voltaren [diclofenac sodium], Zetia [ezetimibe], Ativan [lorazepam], Atorvastatin,  Budesonide-formoterol fumarate, Bupropion hcl, Caffeine, Codeine sulfate, Lisinopril, Metformin, Mometasone furoate, Morphine sulfate, Other, Oxycodone-acetaminophen, Pioglitazone, Propoxyphene n-acetaminophen, Rosuvastatin, Shellfish allergy, Suvorexant, Ticagrelor, Tramadol, Venlafaxine, Xanax [alprazolam], Zolpidem tartrate, and Latex       VS: BP 113/61 (BP Location: Right Arm)    Pulse 80    Temp 98.6 F (37 C) (Oral)    Resp (!) 28    Ht _0  (1.676 m)    Wt 95.4 kg    SpO2 98%    BMI 33.95 kg/m     REVIEW OF SYSTEMS  PATIENT IS UNABLE TO PROVIDE COMPLETE REVIEW OF SYSTEMS DUE TO SEVERE CRITICAL ILLNESS AND TOXIC METABOLIC ENCEPHALOPATHY    PHYSICAL EXAMINATION:  GENERAL:critically ill appearing, +resp distress EYES: Pupils equal, round, reactive to light.  No scleral icterus.  MOUTH: Moist mucosal membrane. NECK: Supple.  PULMONARY: +rhonchi, +wheezing CARDIOVASCULAR: S1 and S2.  No murmurs  NEUROLOGIC: agitated and delirious SKIN:intact,warm,dry       IMAGING    CT HEAD WO CONTRAST (5MM)  Result Date: 06/08/2021 CLINICAL DATA:  Pneumonia, complication suspected. Malaise, weakness EXAM: CT HEAD WITHOUT CONTRAST TECHNIQUE: Contiguous axial images were obtained from the base of the skull through the vertex without intravenous contrast. COMPARISON:  Brain MRI 10/11/2016 FINDINGS: Brain: There is no evidence of acute intracranial hemorrhage, extra-axial fluid collection, or acute infarct. There is an area of encephalomalacia in the left temporoparietal region consistent with remote infarct, new since the prior brain MRI from 2018. There is an additional small remote lacunar infarct in the right cerebellar hemisphere. Background parenchymal volume is normal. The ventricles are normal in size. Scattered foci of hypodensity in the remainder of the subcortical and periventricular white matter likely reflects sequela of chronic white matter microangiopathy. A small focus of  calcification along the falx likely corresponds to the meningioma seen on the prior brain MRI from 2018. There is no other solid mass lesion. There is no midline shift. Vascular: There is calcification of the bilateral cavernous ICAs. Skull: Normal. Negative for fracture or focal lesion. Sinuses/Orbits: The paranasal sinuses are clear. Bilateral lens implants are in place. The globes and orbits are otherwise unremarkable.  Other: There is trace fluid in the mastoid air cells bilaterally. IMPRESSION: 1. 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 2. No evidence of acute intracranial pathology. 3. Trace bilateral mastoid effusions. Electronically Signed   By: Valetta Mole M.D.   On: 06/08/2021 14:57   CT CHEST WO CONTRAST  Result Date: 06/08/2021 CLINICAL DATA:  Malaise. Weakness. Dyspnea on exertion. Community-acquired pneumonia and COPD exacerbation. On IV antibiotics. Known aortic dilatation. Aortic valve repair. EXAM: CT CHEST WITHOUT CONTRAST TECHNIQUE: Multidetector CT imaging of the chest was performed following the standard protocol without IV contrast. COMPARISON:  Chest radiograph 1 day prior.  CTA chest 05/26/2021. FINDINGS: Moderate motion degradation. Cardiovascular: Aortic atherosclerosis. Tortuous thoracic aorta. Aortic valve repair. Moderate cardiomegaly, without pericardial effusion. Median sternotomy for CABG. Pulmonary artery enlargement, outflow tract 4.0 cm Mediastinum/Nodes: 1.9 cm right paratracheal node is similar to on the prior. 1.4 cm node within the azygoesophageal recess is unchanged. Hilar regions poorly evaluated without intravenous contrast. 12 mm prevascular node is unchanged. Lungs/Pleura: Trace left pleural fluid or thickening is similar. Significantly worsened aeration since 05/21/2021. Relatively diffuse ground-glass and less so airspace disease with areas of septal thickening. Relative sparing of  the left apex and superior segment left lower lobe. No areas of extraalveolar air.  No pneumothorax. Upper Abdomen: Caudate and lateral segment left liver lobe enlargement. Normal imaged portions of the spleen, stomach, adrenal glands. Cholecystectomy. Musculoskeletal: No acute osseous abnormality. IMPRESSION: 1. Moderately motion degraded exam. 2. Progression of relatively diffuse airspace and ground-glass opacity. Given lack of significant pleural fluid, favor infection, including atypical etiologies. Recommend clinical exclusion of COVID-19. Pulmonary edema felt less likely. 3. Similar thoracic adenopathy, favored to be reactive. 4. Pulmonary artery enlargement suggests pulmonary arterial hypertension. 5.  Aortic Atherosclerosis (ICD10-I70.0). 6. Suspicion of mild cirrhosis.  Correlate with risk factors. Electronically Signed   By: Abigail Miyamoto M.D.   On: 06/08/2021 14:56   CT Angio Chest Pulmonary Embolism (PE) W or WO Contrast  Result Date: 06/04/2021 CLINICAL DATA:  Pulmonary embolism (PE) suspected, positive D-dimer EXAM: CT ANGIOGRAPHY CHEST WITH CONTRAST TECHNIQUE: Multidetector CT imaging of the chest was performed using the standard protocol during bolus administration of intravenous contrast. Multiplanar CT image reconstructions and MIPs were obtained to evaluate the vascular anatomy. CONTRAST:  12m OMNIPAQUE IOHEXOL 350 MG/ML SOLN COMPARISON:  02/01/2021 FINDINGS: Cardiovascular: Adequate opacification of the pulmonary arterial tree. No intraluminal filling defect identified to suggest acute pulmonary embolism. Central pulmonary arteries are enlarged in keeping with changes of pulmonary arterial hypertension, unchanged. Transcatheter aortic valve replacement has been performed. Multi-vessel coronary artery stenting has been performed as well as coronary artery bypass grafting. Cardiomegaly is present, stable. No pericardial effusion. Extensive atheromatous plaque is seen within the thoracic aorta  demonstrating mural irregularity and numerous shallow ulcer like projections. No aortic aneurysm. Mediastinum/Nodes: The thyroid gland is absent or atretic. The esophagus is unremarkable. There is interval enlargement of multiple pathologically enlarged lymph nodes within the right paratracheal and prevascular lymph node groups which measure up to 19 mm in short axis diameter. Numerous shotty bilateral hilar lymph nodes are also identified. Lungs/Pleura: Extensive bilateral asymmetric ground-glass pulmonary infiltrate has developed, most in keeping with atypical infection in the acute setting. No pneumothorax or pleural effusion. No central obstructing lesion. Upper Abdomen: No acute abnormality. Musculoskeletal: No acute bone abnormality. No lytic or blastic bone lesion. Review of the MIP images confirms the above findings. IMPRESSION: No  pulmonary embolism. Extensive multifocal pulmonary infiltrate most in keeping with atypical infection in the acute setting. Stable cardiomegaly. Aortic Atherosclerosis (ICD10-I70.0). Electronically Signed   By: Fidela Salisbury M.D.   On: 06/04/2021 00:04   US Venous Img Lower Unilateral Left  Result Date: 05/26/2021 CLINICAL DATA:  Initial evaluation for acute swelling of left calf, evaluate for DVT. EXAM: Left LOWER EXTREMITY VENOUS DOPPLER ULTRASOUND TECHNIQUE: Gray-scale sonography with graded compression, as well as color Doppler and duplex ultrasound were performed to evaluate the lower extremity deep venous systems from the level of the common femoral vein and including the common femoral, femoral, profunda femoral, popliteal and calf veins including the posterior tibial, peroneal and gastrocnemius veins when visible. The superficial great saphenous vein was also interrogated. Spectral Doppler was utilized to evaluate flow at rest and with distal augmentation maneuvers in the common femoral, femoral and popliteal veins. COMPARISON:  None. FINDINGS: Contralateral Common  Femoral Vein: Respiratory phasicity is normal and symmetric with the symptomatic side. No evidence of thrombus. Normal compressibility. Common Femoral Vein: No evidence of thrombus. Normal compressibility, respiratory phasicity and response to augmentation. Saphenofemoral Junction: No evidence of thrombus. Normal compressibility and flow on color Doppler imaging. Profunda Femoral Vein: No evidence of thrombus. Normal compressibility and flow on color Doppler imaging. Femoral Vein: No evidence of thrombus. Normal compressibility, respiratory phasicity and response to augmentation. Popliteal Vein: No evidence of thrombus. Normal compressibility, respiratory phasicity and response to augmentation. Calf Veins: No evidence of thrombus. Normal compressibility and flow on color Doppler imaging. Superficial Great Saphenous Vein: No evidence of thrombus. Normal compressibility. Venous Reflux:  None. Other Findings:  None. IMPRESSION: No evidence of deep venous thrombosis. Electronically Signed   By: Jeannine Boga M.D.   On: 05/29/2021 19:41   DG Chest Port 1 View  Result Date: 06/07/2021 CLINICAL DATA:  Reason for exam: SOB Hx of CHF, COPD, asthma, aortic valve repair, history of bioprosthetic aortic valve replacement failure with severe AS and resulting in TAVR on 04/20/2021, Rapid Response Nurse currently in room. EXAM: PORTABLE CHEST - 1 VIEW COMPARISON:  06/02/2021 FINDINGS: Interval progression of extensive bilateral asymmetric airspace opacities most confluent in the right mid and upper lung, and left lower lung, with enlarging focal opacity centrally in the left upper lung and worsening interstitial and airspace opacities at the right lung base. Heart size upper limits normal for technique. Previous AVR. CABG markers. Blunting of lateral costophrenic angles suggesting effusions. No pneumothorax. Sternotomy wires. IMPRESSION: 1. Worsening asymmetric extensive airspace infiltrates or edema. 2. Suspect small  pleural effusions. Electronically Signed   By: Lucrezia Europe M.D.   On: 06/07/2021 07:51   DG Chest Port 1 View  Result Date: 05/26/2021 CLINICAL DATA:  Pt to ED via [facility] from home for shortness of breath. Pt speaking in complete sentences stating that she cannot breath. EXAM: PORTABLE CHEST 1 VIEW COMPARISON:  05/21/2021 FINDINGS: Stable changes from prior cardiac surgery and aortic valve replacement. Cardiac silhouette is enlarged. No mediastinal or hilar masses. Bilateral interstitial and patchy hazy airspace lung opacities. Interstitial opacities are similar to the prior exam, airspace opacities appear new. No convincing pleural effusion.  No pneumothorax. Skeletal structures are grossly intact. IMPRESSION: 1. Interstitial and hazy airspace lung opacities. Congestive heart failure with pulmonary edema suspected. Multifocal pneumonia is also in the differential diagnosis. Electronically Signed   By: Lajean Manes M.D.   On: 05/05/2021 18:42   DG Chest Portable 1 View  Result Date: 05/21/2021 CLINICAL DATA:  Shortness  of breath and heart racing. EXAM: PORTABLE CHEST 1 VIEW COMPARISON:  April 30, 2021 FINDINGS: Multiple sternal wires are noted. Stable, diffuse, chronic appearing increased interstitial lung markings are seen. There is no evidence of focal consolidation, pleural effusion or pneumothorax. The cardiac silhouette is enlarged and unchanged in size. An artificial aortic valve is seen. The visualized skeletal structures are unremarkable. IMPRESSION: Stable cardiomegaly and evidence of prior median sternotomy/CABG, without acute cardiopulmonary disease. Electronically Signed   By: Virgina Norfolk M.D.   On: 05/21/2021 02:40   DG Knee Complete 4 Views Left  Result Date: 05/27/2021 CLINICAL DATA:  Pain, swelling, bruising.  Fall. EXAM: LEFT KNEE - COMPLETE 4+ VIEW COMPARISON:  None. FINDINGS: Anterior soft tissue swelling. No acute bony abnormality. Specifically, no fracture, subluxation, or  dislocation. No joint effusion. IMPRESSION: Anterior soft tissue swelling.  No acute bony abnormality. Electronically Signed   By: Rolm Baptise M.D.   On: 05/27/2021 05:28   ECHOCARDIOGRAM COMPLETE  Result Date: 06/04/2021    ECHOCARDIOGRAM REPORT   Patient Name:   [name] Date of Exam: 06/04/2021 Medical Rec #:  [id number]     Height:       66.0 in Accession #:    [id number]    Weight:       208.8 lb Date of Birth:  09-Nov-1945      BSA:          2.037 m Patient Age:    51 years      BP:           151/77 mmHg Patient Gender: F             HR:           86 bpm. Exam Location:  [facility] Procedure: 2D Echo and Intracardiac Opacification Agent Indications:     Dyspnea R06.00  History:         Patient has prior history of Echocardiogram examinations, most                  recent 05/17/2021.                  Aortic Valve: bioprosthetic valve is present in the aortic                  position.  Sonographer:     Kathlen Brunswick RDCS Referring Phys:  [id number] AMY N COX Diagnosing Phys: Kate Sable MD  Sonographer Comments: Technically challenging study due to limited acoustic windows, Technically difficult study due to poor echo windows and no apical window. Image acquisition challenging due to patient body habitus. IMPRESSIONS  1. Left ventricular ejection fraction, by estimation, is 55 to 60%. The left ventricle has normal function. Left ventricular endocardial border not optimally defined to evaluate regional wall motion. There is moderate left ventricular hypertrophy. Left ventricular diastolic parameters are consistent with Grade II diastolic dysfunction (pseudonormalization).  2. Right ventricular systolic function is normal. The right ventricular size is not well visualized.  3. Left atrial size was mildly dilated.  4. A small pericardial effusion is present. The pericardial effusion is posterior and lateral to the left ventricle. There is no evidence of cardiac tamponade.  5. The mitral valve is degenerative.  Trivial mitral valve regurgitation. Mild to moderate mitral stenosis. The mean mitral valve gradient is 8.0 mmHg.  6. The aortic valve has been repaired/replaced. Aortic valve regurgitation is not visualized. There is a bioprosthetic valve present in the aortic position.  7. Aortic dilatation noted. There is mild dilatation of the ascending aorta, measuring 40 mm. FINDINGS  Left Ventricle: Left ventricular ejection fraction, by estimation, is 55 to 60%. The left ventricle has normal function. Left ventricular endocardial border not optimally defined to evaluate regional wall motion. Definity contrast agent was given IV to delineate the left ventricular endocardial borders. The left ventricular internal cavity size was normal in size. There is moderate left ventricular hypertrophy. Left ventricular diastolic parameters are consistent with Grade II diastolic dysfunction (pseudonormalization). Right Ventricle: The right ventricular size is not well visualized. No increase in right ventricular wall thickness. Right ventricular systolic function is normal. Left Atrium: Left atrial size was mildly dilated. Right Atrium: Right atrial size was not well visualized. Pericardium: A small pericardial effusion is present. The pericardial effusion is posterior and lateral to the left ventricle. There is no evidence of cardiac tamponade. Mitral Valve: The mitral valve is degenerative in appearance. Mild to moderate mitral annular calcification. Trivial mitral valve regurgitation. Mild to moderate mitral valve stenosis. MV peak gradient, 14.3 mmHg. The mean mitral valve gradient is 8.0 mmHg. Tricuspid Valve: The tricuspid valve is normal in structure. Tricuspid valve regurgitation is mild. Aortic Valve: The aortic valve has been repaired/replaced. Aortic valve regurgitation is not visualized. Aortic valve mean gradient measures 2.0 mmHg. Aortic valve peak gradient measures 4.1 mmHg. Aortic valve area, by VTI measures 4.24 cm.  There is a bioprosthetic valve present in the aortic position. Pulmonic Valve: The pulmonic valve was normal in structure. Pulmonic valve regurgitation is mild. Aorta: Aortic dilatation noted. There is mild dilatation of the ascending aorta, measuring 40 mm. Venous: The inferior vena cava was not well visualized. IAS/Shunts: No atrial level shunt detected by color flow Doppler.  LEFT VENTRICLE PLAX 2D LVIDd:         4.55 cm      Diastology LVIDs:         3.15 cm      LV e' medial:    5.11 cm/s LV PW:         1.65 cm      LV E/e' medial:  31.3 LV IVS:        1.75 cm      LV e' lateral:   7.51 cm/s LVOT diam:     1.70 cm      LV E/e' lateral: 21.3 LV SV:         54 LV SV Index:   27 LVOT Area:     2.27 cm  LV Volumes (MOD) LV vol d, MOD A2C: 84.3 ml LV vol d, MOD A4C: 123.0 ml LV vol s, MOD A2C: 24.4 ml LV vol s, MOD A4C: 31.7 ml LV SV MOD A2C:     59.9 ml LV SV MOD A4C:     123.0 ml LV SV MOD BP:      79.1 ml LEFT ATRIUM           Index LA diam:      4.70 cm 2.31 cm/m LA Vol (A4C): 74.9 ml 36.77 ml/m  AORTIC VALVE                    PULMONIC VALVE AV Area (Vmax):    2.87 cm     PV Vmax:          1.32 m/s AV Area (Vmean):   2.86 cm     PV Peak grad:     7.0 mmHg AV Area (VTI):  4.24 cm     PR End Diast Vel: 6.66 msec AV Vmax:           101.35 cm/s AV Vmean:          64.200 cm/s AV VTI:            0.128 m AV Peak Grad:      4.1 mmHg AV Mean Grad:      2.0 mmHg LVOT Vmax:         128.00 cm/s LVOT Vmean:        80.900 cm/s LVOT VTI:          0.239 m LVOT/AV VTI ratio: 1.87  AORTA Ao Root diam: 2.90 cm Ao Asc diam:  4.00 cm MITRAL VALVE                TRICUSPID VALVE MV Area (PHT): 3.00 cm     TV Peak grad:   27.6 mmHg MV Area VTI:   1.02 cm     TV Vmax:        2.62 m/s MV Peak grad:  14.3 mmHg MV Mean grad:  8.0 mmHg     SHUNTS MV Vmax:       1.89 m/s     Systemic VTI:  0.24 m MV Vmean:      136.0 cm/s   Systemic Diam: 1.70 cm MV Decel Time: 253 msec MV E velocity: 160.00 cm/s MV A velocity: 173.00 cm/s MV E/A  ratio:  0.92 Kate Sable MD Electronically signed by Kate Sable MD Signature Date/Time: 06/04/2021/3:30:30 PM    Final    ECHOCARDIOGRAM COMPLETE  Result Date: 05/17/2021    ECHOCARDIOGRAM REPORT   Patient Name:   [name] Date of Exam: 05/17/2021 Medical Rec #:  [id number]     Height:       65.0 in Accession #:    [id number]    Weight:       215.0 lb Date of Birth:  07/12/1945      BSA:          2.040 m Patient Age:    59 years      BP:           130/58 mmHg Patient Gender: F             HR:           85 bpm. Exam Location:  [address] Procedure: 2D Echo, Cardiac Doppler and Color Doppler Indications:    Z95.2 Status post TAVR  History:        Patient has prior history of Echocardiogram examinations, most                 recent 04/19/2021. 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  Sonographer:    Cresenciano Lick RDCS Referring Phys: [phone] JILL D MCDANIEL IMPRESSIONS  1. Left ventricular ejection fraction, by estimation, is 55 to 60%. The left ventricle has normal function. The left ventricle has no regional wall motion abnormalities. There is moderate left ventricular hypertrophy. Left ventricular diastolic parameters are consistent with Grade I diastolic dysfunction (impaired relaxation).  2. Right ventricular systolic function is normal. The right ventricular size is normal. There is normal pulmonary artery systolic pressure. The estimated right ventricular  systolic pressure is 66.2 mmHg.  3. Left atrial size was mildly dilated.  4. Right atrial size was mildly dilated.  5. The mitral valve is degenerative. No evidence of mitral valve regurgitation. Moderate to severe mitral stenosis. The mean mitral valve gradient is 9.0 mmHg with MVA by VTI 1.12 cm^2. Moderate mitral annular calcification with extensive valvular calcification.  6. Bioprosthetic aortic valve s/p TAVR. 23 mm  Horkey Sapien THV. No perivalvular leakage noted. Mean gradient 33 mmHg is significantly elevated, dimensionless index 0.31. Would suggest TEE or gated CTA to more closely evaluate bioprosthetic valve stenosis.  7. Aortic dilatation noted. There is mild dilatation of the ascending aorta, measuring 40 mm.  8. The inferior vena cava is normal in size with greater than 50% respiratory variability, suggesting right atrial pressure of 3 mmHg. FINDINGS  Left Ventricle: Left ventricular ejection fraction, by estimation, is 55 to 60%. The left ventricle has normal function. The left ventricle has no regional wall motion abnormalities. The left ventricular internal cavity size was normal in size. There is  moderate left ventricular hypertrophy. Left ventricular diastolic parameters are consistent with Grade I diastolic dysfunction (impaired relaxation). Right Ventricle: The right ventricular size is normal. No increase in right ventricular wall thickness. Right ventricular systolic function is normal. There is normal pulmonary artery systolic pressure. The tricuspid regurgitant velocity is 2.55 m/s, and  with an assumed right atrial pressure of 3 mmHg, the estimated right ventricular systolic pressure is 94.7 mmHg. Left Atrium: Left atrial size was mildly dilated. Right Atrium: Right atrial size was mildly dilated. Pericardium: Trivial pericardial effusion is present. Mitral Valve: The mitral valve is degenerative in appearance. There is severe calcification of the mitral valve leaflet(s). Moderate mitral annular calcification. No evidence of mitral valve regurgitation. Moderate to severe mitral valve stenosis. MV peak gradient, 14.3 mmHg. The mean mitral valve gradient is 9.0 mmHg. Tricuspid Valve: The tricuspid valve is normal in structure. Tricuspid valve regurgitation is trivial. Aortic Valve: Bioprosthetic aortic valve s/p TAVR. 23 mm Barreiro Sapien THV. No perivalvular leakage noted. Mean gradient 33 mmHg is  significantly elevated, dimensionless index 0.31. Would suggest TEE or gated CTA to more closely evaluate bioprosthetic valve stenosis. The aortic valve has been repaired/replaced. Aortic valve regurgitation is not visualized. Aortic valve mean gradient measures 33.0 mmHg. Aortic valve peak gradient measures 34.5 mmHg. Aortic valve area, by VTI measures 1.02 cm. Pulmonic Valve: The pulmonic valve was normal in structure. Pulmonic valve regurgitation is not visualized. Aorta: Aortic dilatation noted. There is mild dilatation of the ascending aorta, measuring 40 mm. Venous: The inferior vena cava is normal in size with greater than 50% respiratory variability, suggesting right atrial pressure of 3 mmHg. IAS/Shunts: No atrial level shunt detected by color flow Doppler.  LEFT VENTRICLE PLAX 2D LVIDd:         4.60 cm   Diastology LVIDs:         3.00 cm   LV e' medial:    6.09 cm/s LV PW:         1.20 cm   LV E/e' medial:  25.9 LV IVS:        1.20 cm   LV e' lateral:   7.62 cm/s LVOT diam:     1.90 cm   LV E/e' lateral: 20.7 LV SV:         57 LV SV Index:   28 LVOT Area:     2.84 cm  RIGHT VENTRICLE RV Basal diam:  4.00 cm  RV S prime:     12.70 cm/s TAPSE (M-mode): 2.2 cm LEFT ATRIUM             Index        RIGHT ATRIUM           Index LA diam:        5.10 cm 2.50 cm/m   RA Area:     19.50 cm LA Vol (A2C):   86.4 ml 42.36 ml/m  RA Volume:   62.50 ml  30.64 ml/m LA Vol (A4C):   54.3 ml 26.62 ml/m LA Biplane Vol: 72.6 ml 35.59 ml/m  AORTIC VALVE AV Area (Vmax):    1.02 cm AV Area (Vmean):   0.95 cm AV Area (VTI):     1.02 cm AV Vmax:           293.80 cm/s AV Vmean:          218.200 cm/s AV VTI:            0.553 m AV Peak Grad:      34.5 mmHg AV Mean Grad:      33.0 mmHg LVOT Vmax:         105.50 cm/s LVOT Vmean:        73.000 cm/s LVOT VTI:          0.200 m LVOT/AV VTI ratio: 0.36  AORTA Ao Root diam: 3.50 cm Ao Asc diam:  4.00 cm MITRAL VALVE                TRICUSPID VALVE MV Area (PHT): 3.34 cm     TR Peak  grad:   26.0 mmHg MV Area VTI:   1.12 cm     TR Vmax:        255.00 cm/s MV Peak grad:  14.3 mmHg MV Mean grad:  9.0 mmHg     SHUNTS MV Vmax:       1.89 m/s     Systemic VTI:  0.20 m MV Vmean:      145.5 cm/s   Systemic Diam: 1.90 cm MV VTI:        0.50 m MV Decel Time: 227 msec MV E velocity: 157.50 cm/s MV A velocity: 177.00 cm/s MV E/A ratio:  0.89 Dalton McleanMD Electronically signed by Franki Monte Signature Date/Time: 05/17/2021/6:15:59 PM    Final       ASSESSMENT/PLAN   SEVERE and Acute on chronic hypoxemic respiratory failure with advanced COPD with acute diastolic heart failure and cor pulmonale with PULM HTN  Patient is suffering and dying process Recommend comfort care measures She is DNR/DNI  ACUTE DIASTOLIC CARDIAC FAILURE-  -oxygen as needed -Lasix as tolerated -follow up cardiac enzymes as indicated  SEVERE COPD EXACERBATION -continue IV steroids as prescribed -continue NEB THERAPY as prescribed -morphine as needed -wean fio2 as needed and tolerated  GI/DVT ppx  Electrolyte derrangements - pharmacy consult    DVT/GI PRX  assessed I Assessed the need for Labs I Assessed the need for Foley I Assessed the need for Central Venous Line Family Discussion when available I Assessed the need for Mobilization I made an Assessment of medications to be adjusted accordingly Safety Risk assessment completed  CASE DISCUSSED IN MULTIDISCIPLINARY ROUNDS WITH ICU TEAM     Critical Care Time devoted to patient care services described in this note is 45 minutes.  Critical care was necessary to treat /prevent imminent and life-threatening deterioration. Overall, patient is critically ill, prognosis is guarded.  Patient with  Multiorgan failure and at high risk for cardiac arrest and death.    Corrin Parker, M.D.  [facility]  Medical Director [place] Director [facility] Cardio-Pulmonary Department

## 2021-07-05 DEATH — deceased

## 2021-11-29 ENCOUNTER — Ambulatory Visit: Payer: Medicare HMO | Admitting: Cardiovascular Disease
